# Patient Record
Sex: Female | Born: 1985 | Race: Black or African American | Hispanic: No | Marital: Married | Smoking: Never smoker
Health system: Southern US, Community
[De-identification: ages and names within clinical notes are randomized; demographics above are authoritative.]

## PROBLEM LIST (undated history)

## (undated) DIAGNOSIS — Z803 Family history of malignant neoplasm of breast: Secondary | ICD-10-CM

## (undated) DIAGNOSIS — G4730 Sleep apnea, unspecified: Secondary | ICD-10-CM

## (undated) DIAGNOSIS — I10 Essential (primary) hypertension: Secondary | ICD-10-CM

## (undated) DIAGNOSIS — T7840XA Allergy, unspecified, initial encounter: Secondary | ICD-10-CM

## (undated) DIAGNOSIS — A419 Sepsis, unspecified organism: Secondary | ICD-10-CM

## (undated) DIAGNOSIS — Z800 Family history of malignant neoplasm of digestive organs: Secondary | ICD-10-CM

## (undated) DIAGNOSIS — Z5189 Encounter for other specified aftercare: Secondary | ICD-10-CM

## (undated) DIAGNOSIS — K5792 Diverticulitis of intestine, part unspecified, without perforation or abscess without bleeding: Secondary | ICD-10-CM

## (undated) DIAGNOSIS — D649 Anemia, unspecified: Secondary | ICD-10-CM

## (undated) DIAGNOSIS — E669 Obesity, unspecified: Secondary | ICD-10-CM

## (undated) HISTORY — DX: Family history of malignant neoplasm of breast: Z80.3

## (undated) HISTORY — DX: Diverticulitis of intestine, part unspecified, without perforation or abscess without bleeding: K57.92

## (undated) HISTORY — DX: Anemia, unspecified: D64.9

## (undated) HISTORY — DX: Essential (primary) hypertension: I10

## (undated) HISTORY — PX: NO PAST SURGERIES: SHX2092

## (undated) HISTORY — DX: Sleep apnea, unspecified: G47.30

## (undated) HISTORY — DX: Sepsis, unspecified organism: A41.9

## (undated) HISTORY — DX: Obesity, unspecified: E66.9

## (undated) HISTORY — DX: Encounter for other specified aftercare: Z51.89

## (undated) HISTORY — DX: Allergy, unspecified, initial encounter: T78.40XA

## (undated) HISTORY — DX: Family history of malignant neoplasm of digestive organs: Z80.0

---
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
## 2003-03-18 ENCOUNTER — Encounter: Admission: RE | Admit: 2003-03-18 | Discharge: 2003-03-18 | Payer: Self-pay | Admitting: Obstetrics and Gynecology

## 2003-03-22 ENCOUNTER — Encounter: Admission: RE | Admit: 2003-03-22 | Discharge: 2003-03-22 | Payer: Self-pay | Admitting: Obstetrics and Gynecology

## 2003-03-24 ENCOUNTER — Inpatient Hospital Stay (HOSPITAL_COMMUNITY): Admission: AD | Admit: 2003-03-24 | Discharge: 2003-03-26 | Payer: Self-pay | Admitting: Obstetrics and Gynecology

## 2003-08-15 ENCOUNTER — Emergency Department (HOSPITAL_COMMUNITY): Admission: EM | Admit: 2003-08-15 | Discharge: 2003-08-15 | Payer: Self-pay | Admitting: Emergency Medicine

## 2005-01-20 ENCOUNTER — Emergency Department (HOSPITAL_COMMUNITY): Admission: EM | Admit: 2005-01-20 | Discharge: 2005-01-20 | Payer: Self-pay | Admitting: Emergency Medicine

## 2005-05-27 ENCOUNTER — Emergency Department (HOSPITAL_COMMUNITY): Admission: EM | Admit: 2005-05-27 | Discharge: 2005-05-27 | Payer: Self-pay | Admitting: Emergency Medicine

## 2006-04-12 ENCOUNTER — Emergency Department (HOSPITAL_COMMUNITY): Admission: EM | Admit: 2006-04-12 | Discharge: 2006-04-12 | Payer: Self-pay | Admitting: Emergency Medicine

## 2007-08-26 ENCOUNTER — Emergency Department (HOSPITAL_COMMUNITY): Admission: EM | Admit: 2007-08-26 | Discharge: 2007-08-26 | Payer: Self-pay | Admitting: Emergency Medicine

## 2008-05-22 ENCOUNTER — Inpatient Hospital Stay (HOSPITAL_COMMUNITY): Admission: AD | Admit: 2008-05-22 | Discharge: 2008-05-24 | Payer: Self-pay | Admitting: Obstetrics and Gynecology

## 2009-02-28 ENCOUNTER — Emergency Department (HOSPITAL_COMMUNITY): Admission: EM | Admit: 2009-02-28 | Discharge: 2009-02-28 | Payer: Self-pay | Admitting: Emergency Medicine

## 2009-04-17 ENCOUNTER — Emergency Department (HOSPITAL_COMMUNITY): Admission: EM | Admit: 2009-04-17 | Discharge: 2009-04-17 | Payer: Self-pay | Admitting: Family Medicine

## 2009-06-19 ENCOUNTER — Emergency Department (HOSPITAL_COMMUNITY): Admission: EM | Admit: 2009-06-19 | Discharge: 2009-06-19 | Payer: Self-pay | Admitting: Emergency Medicine

## 2009-09-01 ENCOUNTER — Emergency Department (HOSPITAL_COMMUNITY): Admission: EM | Admit: 2009-09-01 | Discharge: 2009-09-01 | Payer: Self-pay | Admitting: Emergency Medicine

## 2010-02-04 ENCOUNTER — Emergency Department (HOSPITAL_COMMUNITY): Admission: EM | Admit: 2010-02-04 | Discharge: 2010-02-04 | Payer: Self-pay | Admitting: Emergency Medicine

## 2010-06-01 ENCOUNTER — Emergency Department (HOSPITAL_COMMUNITY)
Admission: EM | Admit: 2010-06-01 | Discharge: 2010-06-01 | Payer: Self-pay | Source: Home / Self Care | Admitting: Emergency Medicine

## 2010-10-23 NOTE — Discharge Summary (Signed)
NAMELESLEY, ATKIN                ACCOUNT NO.:  [id number]   MEDICAL RECORD NO.:  [id number]          PATIENT TYPE:  INP   LOCATION:  9142                          FACILITY:  [facility]   PHYSICIAN:  Zenaida Niece, M.D.DATE OF BIRTH:  13-Feb-1986   DATE OF ADMISSION:  05/22/2008  DATE OF DISCHARGE:  05/24/2008                               DISCHARGE SUMMARY   ADMISSION DIAGNOSIS:  Intrauterine pregnancy at 40 weeks.   DISCHARGE DIAGNOSIS:  Intrauterine pregnancy at 40 weeks.   PROCEDURES:  On May 23, 2008, she had a spontaneous vaginal  delivery.   HISTORY AND PHYSICAL:  This is a 25 year old, gravida 3, para 1-0-1-1,  with an EGA of 40+ weeks, who presents with a complaint of leaking fluid  since 19:45 on May 22, 2008, with occasional contractions.  Evaluation in triage confirmed rupture of membranes and cervix was 2, 50  and -2.  Prenatal care essentially uncomplicated.  She had an ultrasound  at 35 weeks for size greater than dates that revealed appropriate for  gestational age and normal amniotic fluid volume.   PRENATAL LABORATORY DATA:  Blood type is A+ with negative antibody  screen.  RPR nonreactive.  Hepatitis B surface antigen negative.  Rubella immune.  Cystic fibrosis negative.  One-hour Glucola 124.  Group  B strep is negative.   PAST OBSTETRIC HISTORY:  One vaginal delivery at 40 weeks, 8 pounds 3  ounces with a third-degree laceration, and one spontaneous abortion.   PAST MEDICAL HISTORY:  Migraines.   ALLERGIES:  PENICILLIN which causes a rash and itch.   PHYSICAL EXAMINATION:  She is afebrile with stable vital signs.  Last  weight in the office is 295 pounds.  Fetal heart tracing is reactive  with contractions every 5-6 minutes.  Abdomen is obese, gravid,  nontender with an estimated fetal weight of 89 pounds.  Cervix is 2, 50,  -2 vertex presentation per the nurse and again membranes were ruptured.   HOSPITAL COURSE:  The patient was admitted and as  contractions were not  very close she was started on Pitocin augmentation.  Throughout the  night, she progressed in labor and received an epidural.  She reached  complete, pushed fair, and on the morning of May 23, 2008, had a  vaginal delivery of a viable female infant with Apgars of 9 and 9 that  weighed 7 pounds 12 ounces.  Placenta delivered spontaneous was intact  and was sent for cord blood donation.  She had small skid marks which  were hemostatic and not repaired.  Estimated blood loss was  approximately 500 mL.  Postpartum, she had no significant complications.  Predelivery hemoglobin 10.6, postdelivery 10.2.  On postpartum day #1,  she requested discharge home and was felt to be stable enough for  discharge.   DISCHARGE INSTRUCTIONS:  Regular diet, pelvic rest, followup in 6 weeks.  Medications are over-the-counter ibuprofen as needed, and she was given  our discharge pamphlet.      Zenaida Niece, M.D.  Electronically Signed     TDM/MEDQ  D:  06/26/2008  T:  06/26/2008  Job:  [id number]

## 2010-10-23 NOTE — Discharge Summary (Signed)
NAME:  Mikayla Rios, Mikayla Rios                          ACCOUNT NO.:  [id number]   MEDICAL RECORD NO.:  [id number]                   PATIENT TYPE:  INP   LOCATION:  9103                                 FACILITY:  [facility]   PHYSICIAN:  Zenaida Niece, M.D.             DATE OF BIRTH:  06/14/1985   DATE OF ADMISSION:  03/24/2003  DATE OF DISCHARGE:  03/26/2003                                 DISCHARGE SUMMARY   ADMISSION DIAGNOSES:  1. Intrauterine pregnancy at 40 weeks.  2. Premature rupture of membranes.  3. Group B Strep carrier.   DISCHARGE DIAGNOSES:  1. Intrauterine pregnancy at 40 weeks.  2. Premature rupture of membranes.  3. Group B Strep carrier.  4. Vaginal laceration.  5. Postpartum hemorrhage.   PROCEDURE:  On October 17 she had a spontaneous vaginal delivery and went to  the operating room for repair of a vaginal laceration.   HISTORY AND PHYSICAL:  This is a 25 year old black female gravida 1, para 0  with an EGA of 40+ weeks by an LMP consistent with an 18-week ultrasound  with a due date of October 11 who presents with a complaint of spontaneous  rupture of membranes at 0330 on October 17 with some contractions, no  bleeding, and good fetal movement.  Evaluation in triage confirmed rupture  of membranes with light meconium and she was 1 cm dilated with a low grade  temperature to 100.5.   PRENATAL LABORATORIES:  Blood type is A+ with a negative antibody screen.  Rubella immune.  Sickle screen is negative.  RPR nonreactive.  Hepatitis B  surface antigen negative.  HIV negative.  Gonorrhea and Chlamydia negative.  Triple screen normal.  One hour Glucola 67.  Group B Strep is positive.   Prenatal care essentially uncomplicated except for obesity.   PAST MEDICAL HISTORY:  Essentially noncontributory.   ALLERGIES:  PENICILLIN.   PHYSICAL EXAMINATION:  VITAL SIGNS:  She is afebrile with stable vital  signs.  Weight is approximately 265 pounds.  Fetal heart tracing  reactive.  ABDOMEN:  Gravid, obese.  Estimated fetal weight of 8 pounds.  PELVIC:  Vaginal examination was 1, 80, -1, vertex presentation.   HOSPITAL COURSE:  The patient was admitted and started on Pitocin for  induction and clindamycin for group B Strep prophylaxis.  She was allergic  to penicillin and this group B Strep was sensitive to clindamycin.  She  eventually entered active labor, received an epidural, progressed to  complete and pushed well.  On the evening of October 17 she had a vaginal  delivery of a viable female infant with Apgars of 8 and 9 that weighed 8  pounds 3 ounces.  There was a nuchal cord x1 which was reduced.  Placenta  delivered spontaneous and was intact.  She had a partial third degree  laceration which I was unable to adequately visualize in the delivery room.  She was taken to the OR for repair.  Estimated blood loss in the delivery  room was approximately 500 mL.  She was taken to the operating room and had  this partial third degree laceration repaired under her epidural anesthesia  with 3-0 Vicryl.  The internal anal sphincter was reapproximated with 2-0  Vicryl.  Estimated blood loss there was 400-500 mL for a total blood loss of  1000 mL.  Postpartum she did well with some episodes of lightheadedness.  Pre delivery hemoglobin 12.5, post delivery on postpartum day #1 was 8.5 and  post delivery on postpartum day #2 was 8.3.  She had no significant bleeding  on the postpartum floor.  On the morning of postpartum day #2 she was stable  for discharge home.   DISCHARGE INSTRUCTIONS:  Regular diet.  Pelvic rest.  Follow up in four to  six weeks.   DISCHARGE MEDICATIONS:  1. Ibuprofen 600 mg p.o. q.6h. p.r.n.  2. Percocet #20 one to two p.o. q.4-6h. p.r.n. pain.  3. Over-the-counter iron b.i.d.  4. She is given our discharge pamphlet.                                               Zenaida Niece, M.D.    TDM/MEDQ  D:  03/26/2003  T:  03/26/2003  Job:   [id number]

## 2010-10-23 NOTE — Op Note (Signed)
NAME:  Mikayla Rios, Mikayla Rios                          ACCOUNT NO.:  [id number]   MEDICAL RECORD NO.:  [id number]                   PATIENT TYPE:  INP   LOCATION:  9103                                 [facility]ACILITY:  [facility]   PHYSICIAN:  Zenaida Niece, M.D.             DATE OF BIRTH:  22-Aug-1985   DATE OF PROCEDURE:  03/24/2003  DATE OF DISCHARGE:                                 OPERATIVE REPORT   PREOPERATIVE DIAGNOSIS:  Postpartum vaginal laceration.   POSTOPERATIVE DIAGNOSIS:  Postpartum vaginal laceration.   PROCEDURE:  Repair of third degree vaginal laceration.   SURGEON:  Zenaida Niece, M.D.   ANESTHESIA:  Epidural.   ESTIMATED BLOOD LOSS:  Approximately 600 mL at delivery and 400 mL in the  operating room for a total of 1000 mL.   FINDINGS:  The patient had a spontaneous vaginal delivery without  significant complications.  In the labor and delivery room she had a partial  third degree laceration with a very deep vaginal portion.  Due to her body  habitus and some fairly steady bleeding, I was unable to get adequate  visualization of the apex of this laceration.  Prior to bringing her back to  the operating room, the rectus muscle was reinforced at the midline with a  figure-of-eight suture of 2-0 Vicryl and this did appear to reapproximate  the small portion of the external anal sphincter that was torn.  She was  then taken to the operating room for the remainder of the repair, which  confirmed the above findings.   PROCEDURE IN DETAIL:  The patient was taken to the operating room and placed  in the dorsal supine position.  Her previously-placed epidural was dosed and  her legs were placed in mobile stirrups. The perineum and vagina were  prepped and draped in the sterile fashion and her bladder drained with a red  rubber catheter.  With suction and retraction I was able to visualize the  apex of the vaginal portion of the laceration.  This was repaired in  standard fashion  to the hymenal ring.  Multiple bleeding sites were  controlled with figure-of-eight sutures of 3-0 Vicryl.  The perineal muscles  were then reapproximated again in the standard fashion per a second-degree  episiotomy.  This was done down through the external anal sphincter, which  had previously been reapproximated.  The skin was then reapproximated in  subcuticular fashion and the external mucosa reapproximated with running  suture.  Again several other bleeding sites were controlled with 3-0 Vicryl.  Eventual adequate hemostasis was achieved.  The patient tolerated the  procedure well and was taken to the recovery room in stable condition.  Counts were correct, and there were no sponges left in the vagina.  A  [place] sponge was used to push the cervix up during the repair, but this  was removed.  Again, total blood loss was  approximately 1000 mL, and the  patient was hemodynamically stable.                                               Zenaida Niece, M.D.    TDM/MEDQ  D:  03/24/2003  T:  03/25/2003  Job:  [id number]

## 2011-03-12 LAB — RPR: RPR Ser Ql: NONREACTIVE

## 2011-03-12 LAB — CBC
HCT: 29.9 % — ABNORMAL LOW (ref 36.0–46.0)
HCT: 31.4 % — ABNORMAL LOW (ref 36.0–46.0)
Hemoglobin: 10.2 g/dL — ABNORMAL LOW (ref 12.0–15.0)
Hemoglobin: 10.6 g/dL — ABNORMAL LOW (ref 12.0–15.0)
MCHC: 33.6 g/dL (ref 30.0–36.0)
MCHC: 34 g/dL (ref 30.0–36.0)
MCV: 83.3 fL (ref 78.0–100.0)
MCV: 83.8 fL (ref 78.0–100.0)
Platelets: 352 10*3/uL (ref 150–400)
Platelets: 392 10*3/uL (ref 150–400)
RBC: 3.57 MIL/uL — ABNORMAL LOW (ref 3.87–5.11)
RBC: 3.78 MIL/uL — ABNORMAL LOW (ref 3.87–5.11)
RDW: 15.6 % — ABNORMAL HIGH (ref 11.5–15.5)
RDW: 16.2 % — ABNORMAL HIGH (ref 11.5–15.5)
WBC: 8.9 10*3/uL (ref 4.0–10.5)

## 2012-06-07 NOTE — L&D Delivery Note (Signed)
Delivery Note Pt progressed to complete and pushed well.  At 5:11 AM a viable female was delivered via Vaginal, Spontaneous Delivery (Presentation: vtx; LOA).  APGAR: 8, 9; weight pending.   Placenta status: Intact, Spontaneous.  Cord:  with the following complications: None.  Anesthesia: Epidural  Episiotomy: None Lacerations: None Suture Repair: None Est. Blood Loss (mL): 500  Mom to postpartum.  Baby to stay with mom.  Jaidy Cottam D 12/16/2012, 5:26 AM

## 2012-07-18 ENCOUNTER — Other Ambulatory Visit: Payer: Self-pay

## 2012-07-18 LAB — OB RESULTS CONSOLE GC/CHLAMYDIA: Gonorrhea: NEGATIVE

## 2012-10-13 ENCOUNTER — Other Ambulatory Visit (HOSPITAL_COMMUNITY): Payer: Self-pay | Admitting: Obstetrics and Gynecology

## 2012-10-13 DIAGNOSIS — Z3689 Encounter for other specified antenatal screening: Secondary | ICD-10-CM

## 2012-10-17 ENCOUNTER — Ambulatory Visit (HOSPITAL_COMMUNITY): Payer: Self-pay

## 2012-10-25 ENCOUNTER — Other Ambulatory Visit (HOSPITAL_COMMUNITY): Payer: Self-pay | Admitting: Obstetrics and Gynecology

## 2012-10-25 ENCOUNTER — Ambulatory Visit (HOSPITAL_COMMUNITY)
Admission: RE | Admit: 2012-10-25 | Discharge: 2012-10-25 | Disposition: A | Discharge status: Home / Self Care | Payer: No Typology Code available for payment source | Source: Ambulatory Visit | Attending: Obstetrics and Gynecology | Admitting: Obstetrics and Gynecology

## 2012-10-25 ENCOUNTER — Encounter (HOSPITAL_COMMUNITY): Payer: Self-pay

## 2012-10-25 DIAGNOSIS — Z3689 Encounter for other specified antenatal screening: Secondary | ICD-10-CM

## 2012-10-25 DIAGNOSIS — O139 Gestational [pregnancy-induced] hypertension without significant proteinuria, unspecified trimester: Secondary | ICD-10-CM | POA: Insufficient documentation

## 2012-11-02 ENCOUNTER — Other Ambulatory Visit (HOSPITAL_COMMUNITY): Payer: Self-pay | Admitting: Obstetrics and Gynecology

## 2012-11-02 DIAGNOSIS — E669 Obesity, unspecified: Secondary | ICD-10-CM

## 2012-11-02 DIAGNOSIS — Z3689 Encounter for other specified antenatal screening: Secondary | ICD-10-CM

## 2012-11-22 ENCOUNTER — Other Ambulatory Visit (HOSPITAL_COMMUNITY): Payer: Self-pay | Admitting: Obstetrics and Gynecology

## 2012-11-22 ENCOUNTER — Encounter (HOSPITAL_COMMUNITY): Payer: Self-pay

## 2012-11-22 ENCOUNTER — Ambulatory Visit (HOSPITAL_COMMUNITY)
Admission: RE | Admit: 2012-11-22 | Discharge: 2012-11-22 | Disposition: A | Discharge status: Home / Self Care | Payer: No Typology Code available for payment source | Source: Ambulatory Visit | Attending: Obstetrics and Gynecology | Admitting: Obstetrics and Gynecology

## 2012-11-22 DIAGNOSIS — E669 Obesity, unspecified: Secondary | ICD-10-CM

## 2012-11-22 DIAGNOSIS — O10019 Pre-existing essential hypertension complicating pregnancy, unspecified trimester: Secondary | ICD-10-CM | POA: Insufficient documentation

## 2012-11-22 DIAGNOSIS — Z3689 Encounter for other specified antenatal screening: Secondary | ICD-10-CM

## 2012-12-07 ENCOUNTER — Encounter (HOSPITAL_COMMUNITY): Payer: Self-pay | Admitting: *Deleted

## 2012-12-07 ENCOUNTER — Telehealth (HOSPITAL_COMMUNITY): Payer: Self-pay | Admitting: *Deleted

## 2012-12-07 LAB — OB RESULTS CONSOLE GBS: GBS: NEGATIVE

## 2012-12-07 NOTE — Telephone Encounter (Signed)
Preadmission screen  

## 2012-12-13 ENCOUNTER — Telehealth (HOSPITAL_COMMUNITY): Payer: Self-pay | Admitting: *Deleted

## 2012-12-13 ENCOUNTER — Encounter (HOSPITAL_COMMUNITY): Payer: Self-pay | Admitting: *Deleted

## 2012-12-13 NOTE — Telephone Encounter (Signed)
Preadmission screen  

## 2012-12-15 ENCOUNTER — Encounter (HOSPITAL_COMMUNITY): Payer: Self-pay | Admitting: Anesthesiology

## 2012-12-15 ENCOUNTER — Encounter (HOSPITAL_COMMUNITY): Payer: Self-pay

## 2012-12-15 ENCOUNTER — Inpatient Hospital Stay (HOSPITAL_COMMUNITY)
Admission: RE | Admit: 2012-12-15 | Discharge: 2012-12-18 | DRG: 774 | Disposition: A | Discharge status: Home / Self Care | Payer: No Typology Code available for payment source | Source: Ambulatory Visit | Attending: Obstetrics and Gynecology | Admitting: Obstetrics and Gynecology

## 2012-12-15 ENCOUNTER — Inpatient Hospital Stay (HOSPITAL_COMMUNITY): Payer: No Typology Code available for payment source | Admitting: Anesthesiology

## 2012-12-15 DIAGNOSIS — O1002 Pre-existing essential hypertension complicating childbirth: Secondary | ICD-10-CM | POA: Diagnosis present

## 2012-12-15 LAB — ABO/RH: ABO/RH(D): A POS

## 2012-12-15 LAB — CBC
HCT: 31.9 % — ABNORMAL LOW (ref 36.0–46.0)
MCH: 24.7 pg — ABNORMAL LOW (ref 26.0–34.0)
MCHC: 32 g/dL (ref 30.0–36.0)
RDW: 16.5 % — ABNORMAL HIGH (ref 11.5–15.5)

## 2012-12-15 LAB — TYPE AND SCREEN
ABO/RH(D): A POS
Antibody Screen: NEGATIVE

## 2012-12-15 LAB — RPR: RPR Ser Ql: NONREACTIVE

## 2012-12-15 MED ORDER — LIDOCAINE HCL (PF) 1 % IJ SOLN
30.0000 mL | INTRAMUSCULAR | Status: DC | PRN
  Filled 2012-12-15 (×2): qty 30

## 2012-12-15 MED ORDER — CITRIC ACID-SODIUM CITRATE 334-500 MG/5ML PO SOLN: 30.0000 mL | ORAL | Status: DC | PRN

## 2012-12-15 MED ORDER — ONDANSETRON HCL 4 MG/2ML IJ SOLN: 4.0000 mg | Freq: Four times a day (QID) | INTRAMUSCULAR | Status: DC | PRN

## 2012-12-15 MED ORDER — OXYTOCIN 40 UNITS IN LACTATED RINGERS INFUSION - SIMPLE MED: 62.5000 mL/h | INTRAVENOUS | Status: DC

## 2012-12-15 MED ORDER — LACTATED RINGERS IV SOLN: 500.0000 mL | Freq: Once | INTRAVENOUS | Status: DC

## 2012-12-15 MED ORDER — OXYTOCIN BOLUS FROM INFUSION: 500.0000 mL | INTRAVENOUS | Status: DC

## 2012-12-15 MED ORDER — IBUPROFEN 600 MG PO TABS
600.0000 mg | ORAL_TABLET | Freq: Four times a day (QID) | ORAL | Status: DC | PRN
  Administered 2012-12-16: 600 mg via ORAL
  Filled 2012-12-15: qty 1

## 2012-12-15 MED ORDER — MISOPROSTOL 25 MCG QUARTER TABLET
25.0000 ug | ORAL_TABLET | ORAL | Status: DC | PRN
  Administered 2012-12-15: 25 ug via VAGINAL
  Filled 2012-12-15: qty 1
  Filled 2012-12-15: qty 0.25

## 2012-12-15 MED ORDER — EPHEDRINE 5 MG/ML INJ
10.0000 mg | INTRAVENOUS | Status: DC | PRN
  Filled 2012-12-15: qty 2

## 2012-12-15 MED ORDER — TERBUTALINE SULFATE 1 MG/ML IJ SOLN: 0.2500 mg | Freq: Once | INTRAMUSCULAR | Status: AC | PRN

## 2012-12-15 MED ORDER — LIDOCAINE HCL (PF) 1 % IJ SOLN
INTRAMUSCULAR | Status: DC | PRN
  Administered 2012-12-15 (×4): 4 mL

## 2012-12-15 MED ORDER — LACTATED RINGERS IV SOLN
500.0000 mL | INTRAVENOUS | Status: DC | PRN
  Administered 2012-12-16: 500 mL via INTRAVENOUS

## 2012-12-15 MED ORDER — DIPHENHYDRAMINE HCL 50 MG/ML IJ SOLN
12.5000 mg | INTRAMUSCULAR | Status: DC | PRN
  Administered 2012-12-15: 12.5 mg via INTRAVENOUS
  Filled 2012-12-15: qty 1

## 2012-12-15 MED ORDER — PHENYLEPHRINE 40 MCG/ML (10ML) SYRINGE FOR IV PUSH (FOR BLOOD PRESSURE SUPPORT)
80.0000 ug | PREFILLED_SYRINGE | INTRAVENOUS | Status: DC | PRN
  Filled 2012-12-15: qty 5
  Filled 2012-12-15: qty 2

## 2012-12-15 MED ORDER — FENTANYL 2.5 MCG/ML BUPIVACAINE 1/10 % EPIDURAL INFUSION (WH - ANES)
14.0000 mL/h | INTRAMUSCULAR | Status: DC | PRN
  Administered 2012-12-15 – 2012-12-16 (×2): 14 mL/h via EPIDURAL
  Filled 2012-12-15 (×2): qty 125

## 2012-12-15 MED ORDER — ACETAMINOPHEN 325 MG PO TABS: 650.0000 mg | ORAL_TABLET | ORAL | Status: DC | PRN

## 2012-12-15 MED ORDER — EPHEDRINE 5 MG/ML INJ
10.0000 mg | INTRAVENOUS | Status: DC | PRN
  Filled 2012-12-15: qty 2
  Filled 2012-12-15: qty 4

## 2012-12-15 MED ORDER — PHENYLEPHRINE 40 MCG/ML (10ML) SYRINGE FOR IV PUSH (FOR BLOOD PRESSURE SUPPORT)
80.0000 ug | PREFILLED_SYRINGE | INTRAVENOUS | Status: DC | PRN
  Filled 2012-12-15: qty 2

## 2012-12-15 MED ORDER — LACTATED RINGERS IV SOLN
INTRAVENOUS | Status: DC
  Administered 2012-12-15 (×2): via INTRAVENOUS

## 2012-12-15 MED ORDER — OXYCODONE-ACETAMINOPHEN 5-325 MG PO TABS: 1.0000 | ORAL_TABLET | ORAL | Status: DC | PRN

## 2012-12-15 MED ORDER — BUTORPHANOL TARTRATE 1 MG/ML IJ SOLN
1.0000 mg | INTRAMUSCULAR | Status: DC | PRN
  Administered 2012-12-15 – 2012-12-16 (×2): 1 mg via INTRAVENOUS
  Filled 2012-12-15 (×2): qty 1

## 2012-12-15 MED ORDER — OXYTOCIN 40 UNITS IN LACTATED RINGERS INFUSION - SIMPLE MED
1.0000 m[IU]/min | INTRAVENOUS | Status: DC
  Administered 2012-12-15: 2 m[IU]/min via INTRAVENOUS
  Filled 2012-12-15: qty 1000

## 2012-12-15 NOTE — Progress Notes (Signed)
Feeling ctx Afeb, VSS, BP ok FHT- Cat I, ctx q 3 min after one dose of cytotec VE- 3/50/-2, attempted AROM-unsuccessful due to pt discomfort Will monitor progress, pitocin if ctx space out

## 2012-12-15 NOTE — H&P (Signed)
Mikayla Rios is a 27 y.o. female, G4 P27, EGA 40+ weeks presenting for induction.  She had elevated BP early in pregnancy and intermittently, seemed to be better with appropriate sized cuff.  Has proteinuria documented by early 24 hr urine.  See prenatal records for complete history.  Maternal Medical History:  Fetal activity: Perceived fetal activity is normal.      OB History   Grav Para Term Preterm Abortions TAB SAB Ect Mult Living   3 2 2  0 0 0 0 0 0 2    SVD at term x 2  Past Medical History  Diagnosis Date  . Blood infection 1985  . Blood transfusion without reported diagnosis    Past Surgical History  Procedure Laterality Date  . No past surgeries     Family History: family history includes Autism in her son; Cancer in her mother, paternal grandfather, and paternal uncle; Diabetes in her maternal grandfather, maternal grandmother, mother, and paternal grandmother; Heart disease in her paternal aunt; Hypertension in her maternal grandmother and mother; and Kidney disease in her maternal grandfather. Social History:  reports that she has never smoked. She has never used smokeless tobacco. She reports that she does not drink alcohol or use illicit drugs.   Prenatal Transfer Tool  Maternal Diabetes: No Genetic Screening: Declined Maternal Ultrasounds/Referrals: Normal Fetal Ultrasounds or other Referrals:  None Maternal Substance Abuse:  No Significant Maternal Medications:  None Significant Maternal Lab Results:  Lab values include: Group B Strep negative Other Comments:  None  Review of Systems  Respiratory: Negative.   Cardiovascular: Negative.     Dilation: 1.5 Effacement (%): 50 Station: -2 Exam by:: L. Lima, RN Blood pressure 140/69, pulse 93, temperature 99 F (37.2 C), temperature source Oral, resp. rate 22, height 5\' 3"  (1.6 m), weight 154.677 kg (341 lb). Maternal Exam:  Abdomen: Estimated fetal weight is 8 lbs.   Fetal presentation:  vertex  Introitus: Normal vulva. Normal vagina.  Amniotic fluid character: not assessed.  Pelvis: adequate for delivery.   Cervix: Cervix evaluated by digital exam.     Fetal Exam Fetal Monitor Review: Mode: ultrasound.   Baseline rate: 140.  Variability: moderate (6-25 bpm).   Pattern: accelerations present and no decelerations.    Fetal State Assessment: Category I - tracings are normal.     Physical Exam  Constitutional:  Obese  Cardiovascular: Normal rate, regular rhythm and normal heart sounds.   No murmur heard. Respiratory: Effort normal and breath sounds normal. No respiratory distress. She has no wheezes.  GI: Soft.  Obese, gravid    Prenatal labs: ABO, Rh: A/Positive/-- (02/11 0000) Antibody: Negative (02/11 0000) Rubella: Immune (02/11 0000) RPR: Nonreactive (02/11 0000)  HBsAg: Negative (02/11 0000)  HIV: Non-reactive (02/11 0000)  GBS: Negative (07/03 0000)  GCT:  nl  Assessment/Plan: IUP at 40+ weeks with intermittent elevated BP for induction.  Cervix borderline favorable.  Will give one dose of cytotec and then reassess.  Monitor BP.   Lien Lyman D 12/15/2012, 1:17 PM

## 2012-12-15 NOTE — Anesthesia Preprocedure Evaluation (Signed)
Anesthesia Evaluation  Patient identified by MRN, date of birth, ID band Patient awake    Reviewed: Allergy & Precautions, H&P , NPO status , Patient's Chart, lab work & pertinent test results, reviewed documented beta blocker date and time   History of Anesthesia Complications Negative for: history of anesthetic complications  Airway Mallampati: III TM Distance: >3 FB Neck ROM: full    Dental  (+) Teeth Intact   Pulmonary neg pulmonary ROS,  breath sounds clear to auscultation        Cardiovascular negative cardio ROS  Rhythm:regular Rate:Normal     Neuro/Psych negative neurological ROS  negative psych ROS   GI/Hepatic negative GI ROS, Neg liver ROS,   Endo/Other  Morbid obesity  Renal/GU negative Renal ROS  negative genitourinary   Musculoskeletal   Abdominal   Peds  Hematology  (+) anemia ,   Anesthesia Other Findings   Reproductive/Obstetrics (+) Pregnancy                           Anesthesia Physical Anesthesia Plan  ASA: III  Anesthesia Plan: Epidural   Post-op Pain Management:    Induction:   Airway Management Planned:   Additional Equipment:   Intra-op Plan:   Post-operative Plan:   Informed Consent: I have reviewed the patients History and Physical, chart, labs and discussed the procedure including the risks, benefits and alternatives for the proposed anesthesia with the patient or authorized representative who has indicated his/her understanding and acceptance.     Plan Discussed with:   Anesthesia Plan Comments:         Anesthesia Quick Evaluation

## 2012-12-15 NOTE — Anesthesia Procedure Notes (Signed)
Epidural Patient location during procedure: OB Start time: 12/15/2012 7:00 PM  Staffing Performed by: anesthesiologist   Preanesthetic Checklist Completed: patient identified, site marked, surgical consent, pre-op evaluation, timeout performed, IV checked, risks and benefits discussed and monitors and equipment checked  Epidural Patient position: sitting Prep: site prepped and draped and DuraPrep Patient monitoring: continuous pulse ox and blood pressure Approach: midline Injection technique: LOR air  Needle:  Needle type: Tuohy  Needle gauge: 17 G Needle length: 9 cm and 9 Needle insertion depth: 7 cm Catheter type: closed end flexible Catheter size: 19 Gauge Catheter at skin depth: 12 cm Test dose: negative  Assessment Events: blood not aspirated, injection not painful, no injection resistance, negative IV test and no paresthesia  Additional Notes Discussed risk of headache, infection, bleeding, nerve injury and failed or incomplete block.  Patient voices understanding and wishes to proceed.  Epidural placed easily on first attempt.  No paresthesia.  Patient tolerated procedure well with no apparent complications.  Jasmine December, MDReason for block:procedure for pain

## 2012-12-15 NOTE — Progress Notes (Signed)
Pt c/o itching.  SEE MAR

## 2012-12-16 ENCOUNTER — Encounter (HOSPITAL_COMMUNITY): Payer: Self-pay

## 2012-12-16 MED ORDER — BENZOCAINE-MENTHOL 20-0.5 % EX AERO: 1.0000 "application " | INHALATION_SPRAY | CUTANEOUS | Status: DC | PRN

## 2012-12-16 MED ORDER — DIBUCAINE 1 % RE OINT: 1.0000 "application " | TOPICAL_OINTMENT | RECTAL | Status: DC | PRN

## 2012-12-16 MED ORDER — ONDANSETRON HCL 4 MG/2ML IJ SOLN: 4.0000 mg | INTRAMUSCULAR | Status: DC | PRN

## 2012-12-16 MED ORDER — IBUPROFEN 600 MG PO TABS
600.0000 mg | ORAL_TABLET | Freq: Four times a day (QID) | ORAL | Status: DC
  Administered 2012-12-16 – 2012-12-18 (×9): 600 mg via ORAL
  Filled 2012-12-16 (×9): qty 1

## 2012-12-16 MED ORDER — TETANUS-DIPHTH-ACELL PERTUSSIS 5-2.5-18.5 LF-MCG/0.5 IM SUSP: 0.5000 mL | Freq: Once | INTRAMUSCULAR | Status: DC

## 2012-12-16 MED ORDER — METHYLERGONOVINE MALEATE 0.2 MG PO TABS: 0.2000 mg | ORAL_TABLET | ORAL | Status: DC | PRN

## 2012-12-16 MED ORDER — WITCH HAZEL-GLYCERIN EX PADS: 1.0000 "application " | MEDICATED_PAD | CUTANEOUS | Status: DC | PRN

## 2012-12-16 MED ORDER — METHYLERGONOVINE MALEATE 0.2 MG/ML IJ SOLN: 0.2000 mg | INTRAMUSCULAR | Status: DC | PRN

## 2012-12-16 MED ORDER — BUPIVACAINE HCL (PF) 0.25 % IJ SOLN
INTRAMUSCULAR | Status: DC | PRN
  Administered 2012-12-16: 10 mL via EPIDURAL

## 2012-12-16 MED ORDER — DIPHENHYDRAMINE HCL 25 MG PO CAPS: 25.0000 mg | ORAL_CAPSULE | Freq: Four times a day (QID) | ORAL | Status: DC | PRN

## 2012-12-16 MED ORDER — LANOLIN HYDROUS EX OINT: TOPICAL_OINTMENT | CUTANEOUS | Status: DC | PRN

## 2012-12-16 MED ORDER — ZOLPIDEM TARTRATE 5 MG PO TABS: 5.0000 mg | ORAL_TABLET | Freq: Every evening | ORAL | Status: DC | PRN

## 2012-12-16 MED ORDER — ONDANSETRON HCL 4 MG PO TABS: 4.0000 mg | ORAL_TABLET | ORAL | Status: DC | PRN

## 2012-12-16 MED ORDER — MEASLES, MUMPS & RUBELLA VAC ~~LOC~~ INJ
0.5000 mL | INJECTION | Freq: Once | SUBCUTANEOUS | Status: DC
  Filled 2012-12-16: qty 0.5

## 2012-12-16 MED ORDER — MAGNESIUM HYDROXIDE 400 MG/5ML PO SUSP: 30.0000 mL | ORAL | Status: DC | PRN

## 2012-12-16 MED ORDER — SIMETHICONE 80 MG PO CHEW: 80.0000 mg | CHEWABLE_TABLET | ORAL | Status: DC | PRN

## 2012-12-16 MED ORDER — SENNOSIDES-DOCUSATE SODIUM 8.6-50 MG PO TABS
2.0000 | ORAL_TABLET | Freq: Every day | ORAL | Status: DC
  Administered 2012-12-16: 2 via ORAL

## 2012-12-16 MED ORDER — PRENATAL MULTIVITAMIN CH
1.0000 | ORAL_TABLET | Freq: Every day | ORAL | Status: DC
  Administered 2012-12-16 – 2012-12-18 (×3): 1 via ORAL
  Filled 2012-12-16 (×3): qty 1

## 2012-12-16 MED ORDER — OXYCODONE-ACETAMINOPHEN 5-325 MG PO TABS: 1.0000 | ORAL_TABLET | ORAL | Status: DC | PRN

## 2012-12-16 NOTE — Plan of Care (Signed)
Problem: Phase I Progression Outcomes Goal: Count of instrument items (Specify in a note) Outcome: Completed/Met Date Met:  12/16/12 10  Comments:  10

## 2012-12-16 NOTE — Progress Notes (Signed)
Comfortable with epidural.  Had SROM with mod meconium at 1845, on pitocin Afeb, VSS, BP labile FHT- Cat I, early decels, ctx q 2 min VE- 5/70/-1, vtx Continue pitocin, monitor progress, anticipate SVD.

## 2012-12-17 NOTE — Anesthesia Postprocedure Evaluation (Signed)
Anesthesia Post Note  Patient: Mikayla Rios  Procedure(s) Performed: * No procedures listed *  Anesthesia type: Epidural  Patient location: Mother/Baby  Post pain: Pain level controlled  Post assessment: Post-op Vital signs reviewed  Last Vitals:  Filed Vitals:   12/17/12 0500  BP: 157/102  Pulse: 98  Temp: 36.7 C  Resp: 19    Post vital signs: Reviewed  Level of consciousness: awake  Complications: No apparent anesthesia complications

## 2012-12-17 NOTE — Progress Notes (Signed)
PPD #1 No problems Afeb, VSS, BP 130-150/60-100 Fundus firm, NT at U-1 Continue routine postpartum care, will keep today to keep an eye on BP.

## 2012-12-18 NOTE — Progress Notes (Signed)
PPD #2 Doing ok, no problems Afeb, VSS, BP still labile Fundus firm D/c home

## 2012-12-18 NOTE — Discharge Summary (Signed)
Obstetric Discharge Summary Reason for Admission: induction of labor Prenatal Procedures: none Intrapartum Procedures: spontaneous vaginal delivery Postpartum Procedures: none Complications-Operative and Postpartum: postpartum hypertension Hemoglobin  Date Value Range Status  12/15/2012 10.2* 12.0 - 15.0 g/dL Final     HCT  Date Value Range Status  12/15/2012 31.9* 36.0 - 46.0 % Final    Physical Exam:  General: alert Lochia: appropriate Uterine Fundus: firm  Discharge Diagnoses: Term Pregnancy-delivered and HTN (Chronic vs gestational)  Discharge Information: Date: 12/18/2012 Activity: pelvic rest Diet: routine Medications: Ibuprofen Condition: stable Instructions: refer to practice specific booklet Discharge to: home Follow-up Information   Follow up with Raven Harmes D, MD. Schedule an appointment as soon as possible for a visit in 1 week. (BP check)    Contact information:   [address] [place] [address] [phone]       Newborn Data: Live born female  Birth Weight: 7 lb 8.5 oz (3415 g) APGAR: 8, 9  Home with mother.  Acelyn Basham D 12/18/2012, 8:54 AM

## 2013-05-26 ENCOUNTER — Emergency Department (HOSPITAL_COMMUNITY)
Admission: EM | Admit: 2013-05-26 | Discharge: 2013-05-26 | Disposition: A | Discharge status: Home / Self Care | Payer: No Typology Code available for payment source | Attending: Emergency Medicine | Admitting: Emergency Medicine

## 2013-05-26 ENCOUNTER — Encounter (HOSPITAL_COMMUNITY): Payer: Self-pay | Admitting: Emergency Medicine

## 2013-05-26 DIAGNOSIS — K006 Disturbances in tooth eruption: Secondary | ICD-10-CM | POA: Insufficient documentation

## 2013-05-26 DIAGNOSIS — Z880 Allergy status to penicillin: Secondary | ICD-10-CM | POA: Insufficient documentation

## 2013-05-26 DIAGNOSIS — K089 Disorder of teeth and supporting structures, unspecified: Secondary | ICD-10-CM | POA: Insufficient documentation

## 2013-05-26 DIAGNOSIS — K0889 Other specified disorders of teeth and supporting structures: Secondary | ICD-10-CM

## 2013-05-26 DIAGNOSIS — Z8619 Personal history of other infectious and parasitic diseases: Secondary | ICD-10-CM | POA: Insufficient documentation

## 2013-05-26 MED ORDER — CLINDAMYCIN HCL 150 MG PO CAPS: 300.0000 mg | ORAL_CAPSULE | Freq: Three times a day (TID) | ORAL | Status: DC

## 2013-05-26 MED ORDER — HYDROCODONE-ACETAMINOPHEN 5-325 MG PO TABS: 1.0000 | ORAL_TABLET | Freq: Four times a day (QID) | ORAL | Status: DC | PRN

## 2013-05-26 NOTE — ED Notes (Signed)
Pt. reports right lower gum pain onset yesterday .

## 2013-05-26 NOTE — ED Provider Notes (Signed)
Medical screening examination/treatment/procedure(s) were performed by non-physician practitioner and as supervising physician I was immediately available for consultation/collaboration.    Olivia Mackie, MD 05/26/13 [phone]

## 2013-05-26 NOTE — ED Provider Notes (Signed)
CSN: [id number]     Arrival date & time 05/26/13  0004 History   First MD Initiated Contact with Patient 05/26/13 0053     Chief Complaint  Patient presents with  . Dental Pain   (Consider location/radiation/quality/duration/timing/severity/associated sxs/prior Treatment) Patient is a 27 y.o. female presenting with tooth pain. The history is provided by the patient. No language interpreter was used.  Dental Pain Location:  Lower Lower teeth location:  31/RL 2nd molar and 30/RL 1st molar Quality:  Aching, throbbing and sharp Severity:  Moderate Onset quality:  Gradual Duration:  4 days Timing:  Constant Progression:  Worsening (since yesterday) Chronicity:  New Context: poor dentition   Context: not trauma   Previous work-up:  Root canal Relieved by:  Nothing Worsened by:  Touching and pressure Ineffective treatments:  NSAIDs Associated symptoms: no difficulty swallowing, no drooling, no facial pain, no facial swelling, no fever, no gum swelling, no neck pain, no oral bleeding and no trismus     Past Medical History  Diagnosis Date  . Blood infection 1985  . Blood transfusion without reported diagnosis    Past Surgical History  Procedure Laterality Date  . No past surgeries     Family History  Problem Relation Age of Onset  . Cancer Mother     breast  . Diabetes Mother   . Hypertension Mother   . Autism Son   . Heart disease Paternal Aunt   . Cancer Paternal Uncle     colon  . Diabetes Maternal Grandmother   . Hypertension Maternal Grandmother   . Diabetes Maternal Grandfather   . Kidney disease Maternal Grandfather   . Diabetes Paternal Grandmother   . Cancer Paternal Grandfather     colon   History  Substance Use Topics  . Smoking status: Never Smoker   . Smokeless tobacco: Never Used  . Alcohol Use: No   OB History   Grav Para Term Preterm Abortions TAB SAB Ect Mult Living   3 3 3  0 0 0 0 0 0 3     Review of Systems  Constitutional: Negative for  fever.  HENT: Positive for dental problem. Negative for drooling and facial swelling.   Musculoskeletal: Negative for neck pain.  All other systems reviewed and are negative.    Allergies  Shellfish allergy and Penicillins  Home Medications   Current Outpatient Rx  Name  Route  Sig  Dispense  Refill  . ibuprofen (ADVIL,MOTRIN) 200 MG tablet   Oral   Take 800 mg by mouth every 6 (six) hours as needed for mild pain.         . clindamycin (CLEOCIN) 150 MG capsule   Oral   Take 2 capsules (300 mg total) by mouth 3 (three) times daily. May dispense as 150mg  capsules   42 capsule   0   . HYDROcodone-acetaminophen (NORCO/VICODIN) 5-325 MG per tablet   Oral   Take 1 tablet by mouth every 6 (six) hours as needed.   5 tablet   0    BP 165/108  Pulse 98  Temp(Src) 98.7 F (37.1 C) (Oral)  Resp 18  SpO2 100%  LMP 05/18/2013  Physical Exam  Nursing note and vitals reviewed. Constitutional: She is oriented to person, place, and time. She appears well-developed and well-nourished. No distress.  HENT:  Head: Normocephalic and atraumatic.  Mouth/Throat: Uvula is midline, oropharynx is clear and moist and mucous membranes are normal. No oral lesions. No trismus in the jaw. Abnormal dentition.  No lacerations. No oropharyngeal exudate.    Uvula midline and patient tolerating secretions without difficulty. No oral lesions or bleeding. No area of fluctuance.  Eyes: Conjunctivae and EOM are normal. Pupils are equal, round, and reactive to light. No scleral icterus.  Neck: Normal range of motion. Neck supple.  No nuchal rigidity or meningismus  Pulmonary/Chest: Effort normal. No stridor. No respiratory distress.  Musculoskeletal: Normal range of motion.  Lymphadenopathy:    She has no cervical adenopathy.  Neurological: She is alert and oriented to person, place, and time.  Skin: Skin is warm and dry. No rash noted. She is not diaphoretic. No erythema. No pallor.  Psychiatric: She  has a normal mood and affect. Her behavior is normal.    ED Course  Procedures (including critical care time) Labs Review Labs Reviewed - No data to display Imaging Review No results found.  EKG Interpretation   None       MDM   1. Dentalgia    Uncomplicated dentalgia. Patient well and nontoxic appearing, hemodynamically stable, and afebrile. Patient tolerating secretions without difficulty. Uvula midline without evidence of peritonsillar abscess. No evidence on physical exam to suggest Ludwig's angina. Patient reliable for dental followup. She will be discharged today with course of clindamycin to cover for infection. Patient instructed to take ibuprofen for pain control and apply heat packs to her face. Norco for breakthrough pain. Return precautions discussed and patient agreeable to plan with no unaddressed concerns.    Antony Madura, PA-C 05/26/13 [phone]

## 2013-05-26 NOTE — ED Notes (Signed)
Pt reports right lower molar pain where molar is missing. States that she has been taking ibuprofen 800mg  for the pain. Last dose was around 3pm yesterday afternoon. Has an appt with the dentist for next week.

## 2014-04-08 ENCOUNTER — Encounter (HOSPITAL_COMMUNITY): Payer: Self-pay | Admitting: Emergency Medicine

## 2016-03-25 ENCOUNTER — Emergency Department (HOSPITAL_COMMUNITY)
Admission: EM | Admit: 2016-03-25 | Discharge: 2016-03-25 | Disposition: A | Discharge status: Home / Self Care | Payer: No Typology Code available for payment source | Attending: Emergency Medicine | Admitting: Emergency Medicine

## 2016-03-25 ENCOUNTER — Encounter (HOSPITAL_COMMUNITY): Payer: Self-pay | Admitting: Emergency Medicine

## 2016-03-25 DIAGNOSIS — K625 Hemorrhage of anus and rectum: Secondary | ICD-10-CM | POA: Insufficient documentation

## 2016-03-25 DIAGNOSIS — N3000 Acute cystitis without hematuria: Secondary | ICD-10-CM

## 2016-03-25 DIAGNOSIS — N3090 Cystitis, unspecified without hematuria: Secondary | ICD-10-CM | POA: Insufficient documentation

## 2016-03-25 LAB — CBC
HCT: 34.6 % — ABNORMAL LOW (ref 36.0–46.0)
Hemoglobin: 11.3 g/dL — ABNORMAL LOW (ref 12.0–15.0)
MCH: 24.8 pg — AB (ref 26.0–34.0)
MCHC: 32.7 g/dL (ref 30.0–36.0)
MCV: 76 fL — ABNORMAL LOW (ref 78.0–100.0)
PLATELETS: 358 10*3/uL (ref 150–400)
RBC: 4.55 MIL/uL (ref 3.87–5.11)
RDW: 15.5 % (ref 11.5–15.5)
WBC: 7.1 10*3/uL (ref 4.0–10.5)

## 2016-03-25 LAB — URINALYSIS, ROUTINE W REFLEX MICROSCOPIC
Bilirubin Urine: NEGATIVE
Glucose, UA: NEGATIVE mg/dL
KETONES UR: NEGATIVE mg/dL
Nitrite: POSITIVE — AB
PROTEIN: NEGATIVE mg/dL
Specific Gravity, Urine: 1.028 (ref 1.005–1.030)
pH: 6 (ref 5.0–8.0)

## 2016-03-25 LAB — COMPREHENSIVE METABOLIC PANEL
ALK PHOS: 35 U/L — AB (ref 38–126)
ALT: 19 U/L (ref 14–54)
AST: 30 U/L (ref 15–41)
Albumin: 4.1 g/dL (ref 3.5–5.0)
Anion gap: 9 (ref 5–15)
BUN: 11 mg/dL (ref 6–20)
CALCIUM: 9 mg/dL (ref 8.9–10.3)
CO2: 22 mmol/L (ref 22–32)
CREATININE: 0.7 mg/dL (ref 0.44–1.00)
Chloride: 107 mmol/L (ref 101–111)
Glucose, Bld: 92 mg/dL (ref 65–99)
Potassium: 4.8 mmol/L (ref 3.5–5.1)
Sodium: 138 mmol/L (ref 135–145)
Total Bilirubin: 1.2 mg/dL (ref 0.3–1.2)
Total Protein: 8 g/dL (ref 6.5–8.1)

## 2016-03-25 LAB — URINE MICROSCOPIC-ADD ON

## 2016-03-25 LAB — TYPE AND SCREEN
ABO/RH(D): A POS
Antibody Screen: NEGATIVE

## 2016-03-25 LAB — POC OCCULT BLOOD, ED: Fecal Occult Bld: NEGATIVE

## 2016-03-25 LAB — LIPASE, BLOOD: Lipase: 16 U/L (ref 11–51)

## 2016-03-25 LAB — POC URINE PREG, ED: PREG TEST UR: NEGATIVE

## 2016-03-25 LAB — ABO/RH: ABO/RH(D): A POS

## 2016-03-25 MED ORDER — SODIUM CHLORIDE 0.9 % IV BOLUS (SEPSIS)
1000.0000 mL | Freq: Once | INTRAVENOUS | Status: AC
  Administered 2016-03-25: 1000 mL via INTRAVENOUS

## 2016-03-25 MED ORDER — CIPROFLOXACIN IN D5W 400 MG/200ML IV SOLN
400.0000 mg | Freq: Once | INTRAVENOUS | Status: AC
  Administered 2016-03-25: 400 mg via INTRAVENOUS
  Filled 2016-03-25: qty 200

## 2016-03-25 MED ORDER — CIPROFLOXACIN HCL 500 MG PO TABS: 500.0000 mg | ORAL_TABLET | Freq: Two times a day (BID) | ORAL | 0 refills | Status: DC

## 2016-03-25 MED ORDER — SODIUM CHLORIDE 0.9 % IV SOLN
INTRAVENOUS | Status: DC
  Administered 2016-03-25: 18:00:00 via INTRAVENOUS

## 2016-03-25 NOTE — ED Notes (Signed)
MD at bedside. 

## 2016-03-25 NOTE — Discharge Instructions (Signed)
Take the antibiotic Cipro as directed for the urinary tract infection. Negative limited to follow-up with GI medicine. Also referral information to the wellness clinic provided to help you with primary care.  Return for any further bleeding 3 episodes of red blood.

## 2016-03-25 NOTE — ED Provider Notes (Signed)
[facility] DEPT Provider Note   CSN: [id number] Arrival date & time: 03/25/16  1423     History   Chief Complaint Chief Complaint  Patient presents with  . Rectal Bleeding  . Tachycardia    HPI Mikayla Rios is a 30 y.o. female.  Patient with one-week history of foul-smelling urine and some dysuria. To three-week history of occasional streaks of red blood in her bowel movements. No large amount of blood. No abdominal pain. No history of anything similar. No nausea no vomiting no fevers.      Past Medical History:  Diagnosis Date  . Blood infection ([place]) 1985  . Blood transfusion without reported diagnosis     Patient Active Problem List   Diagnosis Date Noted  . SVD (spontaneous vaginal delivery) 12/16/2012    Past Surgical History:  Procedure Laterality Date  . NO PAST SURGERIES      OB History    Gravida Para Term Preterm AB Living   3 3 3  0 0 3   SAB TAB Ectopic Multiple Live Births   0 0 0 0 3       Home Medications    Prior to Admission medications   Medication Sig Start Date End Date Taking? Authorizing Provider  clindamycin (CLEOCIN) 150 MG capsule Take 2 capsules (300 mg total) by mouth 3 (three) times daily. May dispense as 150mg  capsules Patient not taking: Reported on 03/25/2016 05/26/13   Antonietta Breach, PA-C  HYDROcodone-acetaminophen (NORCO/VICODIN) 5-325 MG per tablet Take 1 tablet by mouth every 6 (six) hours as needed. Patient not taking: Reported on 03/25/2016 05/26/13   Antonietta Breach, PA-C    Family History Family History  Problem Relation Age of Onset  . Cancer Mother     breast  . Diabetes Mother   . Hypertension Mother   . Autism Son   . Heart disease Paternal Aunt   . Cancer Paternal Uncle     colon  . Diabetes Maternal Grandmother   . Hypertension Maternal Grandmother   . Diabetes Maternal Grandfather   . Kidney disease Maternal Grandfather   . Diabetes Paternal Grandmother   . Cancer Paternal Grandfather     colon      Social History Social History  Substance Use Topics  . Smoking status: Never Smoker  . Smokeless tobacco: Never Used  . Alcohol use No     Allergies   Shellfish allergy and Penicillins   Review of Systems Review of Systems  Constitutional: Negative for fever.  HENT: Negative for congestion.   Eyes: Negative for visual disturbance.  Respiratory: Negative for shortness of breath.   Cardiovascular: Negative for chest pain.  Gastrointestinal: Positive for blood in stool. Negative for abdominal pain, nausea and vomiting.  Genitourinary: Positive for dysuria.  Musculoskeletal: Negative for back pain.  Skin: Negative for rash.  Neurological: Negative for headaches.  Hematological: Does not bruise/bleed easily.  Psychiatric/Behavioral: Negative for confusion.     Physical Exam Updated Vital Signs BP 162/89   Pulse (!) 129   Temp 99.3 F (37.4 C)   Resp 16   SpO2 100%   Physical Exam  Constitutional: She is oriented to person, place, and time. She appears well-developed and well-nourished. No distress.  HENT:  Head: Normocephalic and atraumatic.  Mouth/Throat: Oropharynx is clear and moist.  Eyes: EOM are normal. Pupils are equal, round, and reactive to light.  Neck: Normal range of motion. Neck supple.  Cardiovascular:  Tachycardic  Pulmonary/Chest: Effort normal and breath sounds  normal. No respiratory distress.  Abdominal: Soft. Bowel sounds are normal. There is no tenderness.  Genitourinary: Rectal exam shows guaiac negative stool.  Genitourinary Comments: Total exam without any external hemorrhoids no fissures no prolapsed internal hemorrhoids. Stool heme-negative.  Musculoskeletal: Normal range of motion. She exhibits no edema.  Neurological: She is alert and oriented to person, place, and time. No cranial nerve deficit. She exhibits normal muscle tone. Coordination normal.  Skin: Skin is warm. Capillary refill takes less than 2 seconds.  Nursing note and  vitals reviewed.    ED Treatments / Results  Labs (all labs ordered are listed, but only abnormal results are displayed) Labs Reviewed  COMPREHENSIVE METABOLIC PANEL - Abnormal; Notable for the following:       Result Value   Alkaline Phosphatase 35 (*)    All other components within normal limits  CBC - Abnormal; Notable for the following:    Hemoglobin 11.3 (*)    HCT 34.6 (*)    MCV 76.0 (*)    MCH 24.8 (*)    All other components within normal limits  URINALYSIS, ROUTINE W REFLEX MICROSCOPIC (NOT AT [facility]) - Abnormal; Notable for the following:    APPearance CLOUDY (*)    Hgb urine dipstick TRACE (*)    Nitrite POSITIVE (*)    Leukocytes, UA TRACE (*)    All other components within normal limits  URINE MICROSCOPIC-ADD ON - Abnormal; Notable for the following:    Squamous Epithelial / LPF 0-5 (*)    Bacteria, UA MANY (*)    All other components within normal limits  URINE CULTURE  LIPASE, BLOOD  POC URINE PREG, ED  POC OCCULT BLOOD, ED  POC OCCULT BLOOD, ED  TYPE AND SCREEN  ABO/RH   Results for orders placed or performed during the hospital encounter of 03/25/16  Lipase, blood  Result Value Ref Range   Lipase 16 11 - 51 U/L  Comprehensive metabolic panel  Result Value Ref Range   Sodium 138 135 - 145 mmol/L   Potassium 4.8 3.5 - 5.1 mmol/L   Chloride 107 101 - 111 mmol/L   CO2 22 22 - 32 mmol/L   Glucose, Bld 92 65 - 99 mg/dL   BUN 11 6 - 20 mg/dL   Creatinine, Ser 0.70 0.44 - 1.00 mg/dL   Calcium 9.0 8.9 - 10.3 mg/dL   Total Protein 8.0 6.5 - 8.1 g/dL   Albumin 4.1 3.5 - 5.0 g/dL   AST 30 15 - 41 U/L   ALT 19 14 - 54 U/L   Alkaline Phosphatase 35 (L) 38 - 126 U/L   Total Bilirubin 1.2 0.3 - 1.2 mg/dL   GFR calc non Af Amer >60 >60 mL/min   GFR calc Af Amer >60 >60 mL/min   Anion gap 9 5 - 15  CBC  Result Value Ref Range   WBC 7.1 4.0 - 10.5 K/uL   RBC 4.55 3.87 - 5.11 MIL/uL   Hemoglobin 11.3 (L) 12.0 - 15.0 g/dL   HCT 34.6 (L) 36.0 - 46.0 %   MCV  76.0 (L) 78.0 - 100.0 fL   MCH 24.8 (L) 26.0 - 34.0 pg   MCHC 32.7 30.0 - 36.0 g/dL   RDW 15.5 11.5 - 15.5 %   Platelets 358 150 - 400 K/uL  Urinalysis, Routine w reflex microscopic  Result Value Ref Range   Color, Urine YELLOW YELLOW   APPearance CLOUDY (A) CLEAR   Specific Gravity, Urine 1.028 1.005 -  1.030   pH 6.0 5.0 - 8.0   Glucose, UA NEGATIVE NEGATIVE mg/dL   Hgb urine dipstick TRACE (A) NEGATIVE   Bilirubin Urine NEGATIVE NEGATIVE   Ketones, ur NEGATIVE NEGATIVE mg/dL   Protein, ur NEGATIVE NEGATIVE mg/dL   Nitrite POSITIVE (A) NEGATIVE   Leukocytes, UA TRACE (A) NEGATIVE  Urine microscopic-add on  Result Value Ref Range   Squamous Epithelial / LPF 0-5 (A) NONE SEEN   WBC, UA 0-5 0 - 5 WBC/hpf   RBC / HPF 0-5 0 - 5 RBC/hpf   Bacteria, UA MANY (A) NONE SEEN  POC urine preg, ED  Result Value Ref Range   Preg Test, Ur NEGATIVE NEGATIVE  POC occult blood, ED  Result Value Ref Range   Fecal Occult Bld NEGATIVE NEGATIVE  Type and screen [place]  Result Value Ref Range   ABO/RH(D) A POS    Antibody Screen NEG    Sample Expiration 03/28/2016   ABO/Rh  Result Value Ref Range   ABO/RH(D) A POS     EKG  EKG Interpretation None       Radiology No results found.  Procedures Procedures (including critical care time)  Medications Ordered in ED Medications  0.9 %  sodium chloride infusion ( Intravenous New Bag/Given 03/25/16 1809)  sodium chloride 0.9 % bolus 1,000 mL (1,000 mLs Intravenous New Bag/Given 03/25/16 1704)  ciprofloxacin (CIPRO) IVPB 400 mg (0 mg Intravenous Stopped 03/25/16 1804)     Initial Impression / Assessment and Plan / ED Course  I have reviewed the triage vital signs and the nursing notes.  Pertinent labs & imaging results that were available during my care of the patient were reviewed by me and considered in my medical decision making (see chart for details).  Clinical Course    Patient presented with 2  concerns. One was foul-smelling urine and the other is been streaks of red blood in her bowel movements over the past 2 weeks. No bowel movements in a large amount of blood.  Workup here shows evidence of a urinary tract infection with positive nitrite. Urine culture sent. Patient received Cipro here she has a penicillin allergy we'll be sent home with Cipro. Patient's rectal exam was negative for any occult blood. Will refer to GI medicine for colonoscopy. Patient given precautions. Patient stable for discharge home.  Final Clinical Impressions(s) / ED Diagnoses   Final diagnoses:  Acute cystitis without hematuria  Rectal bleeding    New Prescriptions New Prescriptions   No medications on file     Fredia Sorrow, MD 03/25/16 1836

## 2016-03-25 NOTE — ED Triage Notes (Signed)
Pt report intermittent rectal bleeding (streaks in stool) for the past 3 weeks with BM. Has had foul smell to urine and lower back pain. HR 120s in triage.

## 2016-03-27 LAB — URINE CULTURE

## 2016-03-28 ENCOUNTER — Telehealth (HOSPITAL_BASED_OUTPATIENT_CLINIC_OR_DEPARTMENT_OTHER): Payer: Self-pay

## 2016-03-28 NOTE — Telephone Encounter (Signed)
Post ED Visit - Positive Culture Follow-up  Culture report reviewed by antimicrobial stewardship pharmacist:  []  Elenor Quinones, Pharm.D. []  Heide Guile, Pharm.D., BCPS []  Parks Neptune, Pharm.D. []  Alycia Rossetti, Pharm.D., BCPS [x]  [place], Pharm.D., BCPS, AAHIVP []  Legrand Como, Pharm.D., BCPS, AAHIVP []  Milus Glazier, Pharm.D. []  Stephens November, Pharm.D.  Positive urine culture Treated with Ciprofloxacin HCL, organism sensitive to the same and no further patient follow-up is required at this time.  Genia Del 03/28/2016, 11:13 AM

## 2016-03-31 ENCOUNTER — Ambulatory Visit (INDEPENDENT_AMBULATORY_CARE_PROVIDER_SITE_OTHER): Payer: No Typology Code available for payment source

## 2016-03-31 ENCOUNTER — Ambulatory Visit (HOSPITAL_COMMUNITY)
Admission: EM | Admit: 2016-03-31 | Discharge: 2016-03-31 | Disposition: A | Discharge status: Home / Self Care | Payer: Self-pay | Attending: Emergency Medicine | Admitting: Emergency Medicine

## 2016-03-31 ENCOUNTER — Encounter (HOSPITAL_COMMUNITY): Payer: Self-pay | Admitting: Emergency Medicine

## 2016-03-31 DIAGNOSIS — R0602 Shortness of breath: Secondary | ICD-10-CM

## 2016-03-31 NOTE — ED Triage Notes (Signed)
Seen at [facility] long last week for a uti.  Received ivf at that time, heart rate elevated per patient.   Over the weekend felt sob.  Sob intermittent.  Yesterday patient reports palpating a knot in chest.  Reports this is visible.  Patient has never noticed this before.  Patient reports sob with ambulation and normally does not feel this way per patient .   Patient is obese, large chest circumference.   Patient had difficulty sleeping last night due to sob.  Patient reports she had to sit up, but eventually was able to lie down to sleep

## 2016-03-31 NOTE — ED Notes (Signed)
Patient aware of situations where she should return to emergency department

## 2016-03-31 NOTE — ED Notes (Signed)
ekg handed to dr Alphonzo Cruise

## 2016-03-31 NOTE — Discharge Instructions (Signed)
Go ahead and finish the Cipro. Try some Pepcid and some Zantac and see if this helps. Go to the ER for the signs and symptoms we discussed. Follow up with a primary care physician of your choice. See list below.  Below is a list of primary care practices who are taking new patients for you to follow-up with.   [facility] [phone] [place] [address]  [facility]: [place]  [phone]  [place] [facility]: [address] [place]   [phone]  [facility]: [address] [place]  [phone]  [place] [facility] : [address] [place] [phone]  [facility]: [address] [place] [phone]  [facility]: [place] [phone]  Dr. Blanchie Serve [place]  [phone]  Dr. Benito Mccreedy, [facility]. [place], [place] [address]  [phone]

## 2016-03-31 NOTE — ED Notes (Signed)
Patient does have a soft, fullness to center chest

## 2016-03-31 NOTE — ED Provider Notes (Signed)
HPI  SUBJECTIVE:  Mikayla Rios is a 30 y.o. female who presents with intermittent, minutes long shortness of breath for 6 days. States that this started after she was given IV fluids and Cipro in the [place] long ED for UTI. She is currently in day #5 of Cipro for her UTI. She reports a an occasional cough productive of clear mucus. sx are worse lying down flat, better with sitting up. She has not tried anything for this. She reports chest pain described as soreness after carrying a pot of water 1-2 days ago. This is worse with torso rotation and arm movement. however, she reports a sensation of "something sitting on my chest last night" while walking around. States that this is the first time these symptoms ever happened. It lasted several minutes and then resolved on its own. She denies nausea, diaphoresis, palpitations, presyncope or syncope during this episode. No worsening of shortness of breath, water brash or burning chest pain. She was able to walk around today without having recurrent  symptoms. She reports having a sleep on 3 pillows last night, and some nocturia. states that she is urinating several times a night, but she has increased her fluids to help with this UTI. She reports shortness of breath with exertion today, but has not had to stop and rest. No nasal congestion, postnasal drip, rhinorrhea, no wheezing, hemoptysis, fevers. No lower extremity edema, weight gain, abdominal pain, PND. No calf pain, swelling, immobilization. No surgery in the past 4 weeks. No history of PE, DVT, hypercoagulability, cancer. No history of GERD, MI, diabetes, hypertension. No history of CHF, asthma, emphysema, COPD. No exogenous estrogen. No smoking. No history of kidney disease. Patient is morbidly obese. LMP: 9/26. Denies possibility pregnant. States her urine pregnancy was  negative 6 days ago and she has not been sexually active since then. Family history negative for early MI. PMD: None.  Patient states  that she has not taken the Cipro today and her shortness of breath has gotten better. She also reports exposure to a gas leak several days ago, but the shortness of breath that started prior to that.   Past Medical History:  Diagnosis Date  . Blood infection ([place]) 1985  . Blood transfusion without reported diagnosis     Past Surgical History:  Procedure Laterality Date  . NO PAST SURGERIES      Family History  Problem Relation Age of Onset  . Cancer Mother     breast  . Diabetes Mother   . Hypertension Mother   . Autism Son   . Heart disease Paternal Aunt   . Cancer Paternal Uncle     colon  . Diabetes Maternal Grandmother   . Hypertension Maternal Grandmother   . Diabetes Maternal Grandfather   . Kidney disease Maternal Grandfather   . Diabetes Paternal Grandmother   . Cancer Paternal Grandfather     colon    Social History  Substance Use Topics  . Smoking status: Never Smoker  . Smokeless tobacco: Never Used  . Alcohol use No    No current facility-administered medications for this encounter.   Current Outpatient Prescriptions:  .  OVER THE COUNTER MEDICATION, otc pain reliever, Disp: , Rfl:  .  ciprofloxacin (CIPRO) 500 MG tablet, Take 1 tablet (500 mg total) by mouth 2 (two) times daily., Disp: 14 tablet, Rfl: 0  Allergies  Allergen Reactions  . Shellfish Allergy Hives  . Penicillins Hives and Rash    Has patient had a  PCN reaction causing immediate rash, facial/tongue/throat swelling, SOB or lightheadedness with hypotension: No Has patient had a PCN reaction causing severe rash involving mucus membranes or skin necrosis: hives Has patient had a PCN reaction that required hospitalization No Has patient had a PCN reaction occurring within the last 10 years: yes If all of the above answers are "NO", then may proceed with Cephalosporin use.      ROS  As noted in HPI.   Physical Exam  BP 149/95 (BP Location: Left Arm)   Pulse 103   Temp 98.6 F (37  C) (Oral)   Resp 20   LMP 03/02/2016 (Exact Date)   SpO2 100%    Wt Readings from Last 3 Encounters:  12/15/12 (!) 341 lb (154.7 kg)  11/22/12 (!) 340 lb (154.2 kg)   Temp Readings from Last 3 Encounters:  03/31/16 98.6 F (37 C) (Oral)  03/25/16 99.3 F (37.4 C)  05/26/13 98.7 F (37.1 C) (Oral)   BP Readings from Last 3 Encounters:  03/31/16 149/95  03/25/16 154/99  05/26/13 (!) 165/108   Pulse Readings from Last 3 Encounters:  03/31/16 103  03/25/16 106  05/26/13 98   Constitutional: Well developed, well nourished, no acute distress.  patient able tolerate lying flat. Eyes: PERRL, EOMI, conjunctiva normal bilaterally HENT: Normocephalic, atraumatic,mucus membranes moist Respiratory: Clear to auscultation bilaterally, no rales, no wheezing, no rhonchi Cardiovascular: Normal rate and rhythm, no murmurs, no gallops, no rubs no JVD GI: Soft, nondistended, normal bowel sounds, nontender, no rebound, no guarding skin: No rash, skin intact Musculoskeletal: Calves symmetric, nontender No edema, no tenderness, no deformities Neurologic: Alert & oriented x 3, CN II-XII grossly intact, no motor deficits, sensation grossly intact Psychiatric: Speech and behavior appropriate   ED Course   Medications - No data to display  Orders Placed This Encounter  Procedures  . DG Chest 2 View    Standing Status:   Standing    Number of Occurrences:   1    Order Specific Question:   Reason for Exam (SYMPTOM  OR DIAGNOSIS REQUIRED)    Answer:   SOB x 1 week r/o pulm edema, effusion, PNA  . ED EKG    Standing Status:   Standing    Number of Occurrences:   1    Order Specific Question:   Reason for Exam    Answer:   Shortness of breath   No results found for this or any previous visit (from the past 24 hour(s)). Dg Chest 2 View  Result Date: 03/31/2016 CLINICAL DATA:  Per pt: SOB started about four days ago, went to [facility] and was put on Cepro and an IV. Last night a lump came up on  the center, patient pointed to the mid to slightly superior sternum, patient became SOB again last night, could only breath normally while lying on the left side. Non-smoker. No history of cardiac disease. History of Bronchitis. Patient is hyper-stenic. Patient is not a diabetic. EXAM: CHEST  2 VIEW COMPARISON:  09/01/2009 FINDINGS: Cardiac silhouette is borderline enlarged. Normal mediastinal and hilar contours. Clear lungs.  No pleural effusion or pneumothorax. Skeletal structures are unremarkable. IMPRESSION: No active cardiopulmonary disease. Electronically Signed   By: Lajean Manes M.D.   On: 03/31/2016 11:31    ED Clinical Impression  Shortness of breath   ED Assessment/Plan  Chest x-ray independently reviewed. Borderline cardiomegaly. No pleural effusion, pneumothorax. No active cardiopulmonary disease per radiology. See radiology report for details.  EKG: Normal  sinus rhythm, rate 112, normal axis, normal intervals. No hypertrophy. No ST-T wave changes concerning for ischemia. No previous EKG for comparison.  In The differential is PE, doubt DVT. However, patient denies any pleuritic chest pain and is satting 100% on room air. Has only mild tachycardia. Doubt asthma, emphysema, COPD. Doubt pneumonia. In the differential is  CHF, however she does not have any physical findings of this, but will get chest x-ray To rule out any acute changes. Also getting EKG given history of chest pressure last night. She is asymptomatic at this point in ime.  Modified well's criteria 1/ 9. She is low risk for PE. Her sx may be due to acid reflux from the Cipro since symptoms started after taking this. Advised her to try some Pepcid and some Zantac, to stay with from pulmonary irritants. Offered to send patient to the ED for comprehensive workup today, but she is willing to go home and try these things first, and if she has any worsening of her symptoms, return of her chest pain or other concerns, she is to go  to the ED immediately. She is comfortable with this plan.  Will also provide primary care referral list.  Discussed  imaging, MDM, plan and followup . Discussed sn/sx that should prompt return to the ED. Patient agrees with plan.   Meds ordered this encounter  Medications  . OVER THE COUNTER MEDICATION    Sig: otc pain reliever    *This clinic note was created using [employer]. Therefore, there may be occasional mistakes despite careful proofreading.  ?   Melynda Ripple, MD 03/31/16 2133

## 2016-07-22 ENCOUNTER — Ambulatory Visit (HOSPITAL_COMMUNITY)
Admission: EM | Admit: 2016-07-22 | Discharge: 2016-07-22 | Disposition: A | Discharge status: Home / Self Care | Payer: Self-pay | Attending: Internal Medicine | Admitting: Internal Medicine

## 2016-07-22 DIAGNOSIS — J40 Bronchitis, not specified as acute or chronic: Secondary | ICD-10-CM

## 2016-07-22 DIAGNOSIS — R059 Cough, unspecified: Secondary | ICD-10-CM

## 2016-07-22 DIAGNOSIS — R05 Cough: Secondary | ICD-10-CM

## 2016-07-22 MED ORDER — BENZONATATE 100 MG PO CAPS: 200.0000 mg | ORAL_CAPSULE | Freq: Three times a day (TID) | ORAL | 0 refills | Status: DC | PRN

## 2016-07-22 MED ORDER — AZITHROMYCIN 250 MG PO TABS: 250.0000 mg | ORAL_TABLET | Freq: Every day | ORAL | 0 refills | Status: DC

## 2016-07-22 MED ORDER — ALBUTEROL SULFATE HFA 108 (90 BASE) MCG/ACT IN AERS: 1.0000 | INHALATION_SPRAY | Freq: Four times a day (QID) | RESPIRATORY_TRACT | 0 refills | Status: DC | PRN

## 2016-07-22 MED ORDER — METHYLPREDNISOLONE 4 MG PO TBPK: ORAL_TABLET | ORAL | 0 refills | Status: DC

## 2016-07-22 NOTE — ED Triage Notes (Signed)
States on Saturday  patient had a cough with a sore throat, headache, ear ache, diarrhea and fever otc meds used as tx Denies vomiting  States she has a little sob

## 2016-07-22 NOTE — ED Provider Notes (Signed)
CSN: [id number]     Arrival date & time 07/22/16  1237 History   First MD Initiated Contact with Patient 07/22/16 1334     Chief Complaint  Patient presents with  . Fever   (Consider location/radiation/quality/duration/timing/severity/associated sxs/prior Treatment) Patient c/o cough and SOB.  She states she wheezes at night.   The history is provided by the patient.  Cough  Cough characteristics:  Non-productive Sputum characteristics:  White Severity:  Moderate Onset quality:  Gradual Duration:  1 week Timing:  Constant Progression:  Worsening Chronicity:  New Smoker: no   Context: upper respiratory infection and weather changes   Relieved by:  Nothing Worsened by:  Nothing Ineffective treatments:  None tried   Past Medical History:  Diagnosis Date  . Blood infection ([place]) 1985  . Blood transfusion without reported diagnosis    Past Surgical History:  Procedure Laterality Date  . NO PAST SURGERIES     Family History  Problem Relation Age of Onset  . Cancer Mother     breast  . Diabetes Mother   . Hypertension Mother   . Autism Son   . Heart disease Paternal Aunt   . Cancer Paternal Uncle     colon  . Diabetes Maternal Grandmother   . Hypertension Maternal Grandmother   . Diabetes Maternal Grandfather   . Kidney disease Maternal Grandfather   . Diabetes Paternal Grandmother   . Cancer Paternal Grandfather     colon   Social History  Substance Use Topics  . Smoking status: Never Smoker  . Smokeless tobacco: Never Used  . Alcohol use No   OB History    Gravida Para Term Preterm AB Living   3 3 3  0 0 3   SAB TAB Ectopic Multiple Live Births   0 0 0 0 3     Review of Systems  Constitutional: Negative.   HENT: Negative.   Eyes: Negative.   Respiratory: Positive for cough.   Cardiovascular: Negative.   Gastrointestinal: Negative.   Endocrine: Negative.   Genitourinary: Negative.   Musculoskeletal: Negative.   Allergic/Immunologic: Negative.    Neurological: Negative.   Hematological: Negative.   Psychiatric/Behavioral: Negative.     Allergies  Shellfish allergy and Penicillins  Home Medications   Prior to Admission medications   Medication Sig Start Date End Date Taking? Authorizing Provider  albuterol (PROVENTIL HFA;VENTOLIN HFA) 108 (90 Base) MCG/ACT inhaler Inhale 1-2 puffs into the lungs every 6 (six) hours as needed for wheezing or shortness of breath. 07/22/16   Lysbeth Penner, FNP  azithromycin (ZITHROMAX) 250 MG tablet Take 1 tablet (250 mg total) by mouth daily. Take first 2 tablets together, then 1 every day until finished. 07/22/16   Lysbeth Penner, FNP  benzonatate (TESSALON) 100 MG capsule Take 2 capsules (200 mg total) by mouth 3 (three) times daily as needed for cough. 07/22/16   Lysbeth Penner, FNP  ciprofloxacin (CIPRO) 500 MG tablet Take 1 tablet (500 mg total) by mouth 2 (two) times daily. 03/25/16   Fredia Sorrow, MD  methylPREDNISolone (MEDROL DOSEPAK) 4 MG TBPK tablet Take 6-5-4-3-2-1 po qd 07/22/16   Lysbeth Penner, FNP  OVER THE COUNTER MEDICATION otc pain reliever    Historical Provider, MD   Meds Ordered and Administered this Visit  Medications - No data to display  BP (!) 136/102 (BP Location: Right Arm)   Pulse 117   Temp 99.3 F (37.4 C) (Oral)   Resp 18   LMP 07/17/2016  SpO2 100%  No data found.   Physical Exam  Constitutional: She appears well-developed and well-nourished.  HENT:  Head: Normocephalic and atraumatic.  Right Ear: External ear normal.  Left Ear: External ear normal.  Mouth/Throat: Oropharynx is clear and moist.  Eyes: Conjunctivae and EOM are normal. Pupils are equal, round, and reactive to light.  Neck: Normal range of motion. Neck supple.  Cardiovascular: Normal rate, regular rhythm and normal heart sounds.   Pulmonary/Chest: Effort normal.  BBS with diminished breath sounds.  Abdominal: Soft. Bowel sounds are normal.  Nursing note and vitals  reviewed.   Urgent Care Course     Procedures (including critical care time)  Labs Review Labs Reviewed - No data to display  Imaging Review No results found.   Visual Acuity Review  Right Eye Distance:   Left Eye Distance:   Bilateral Distance:    Right Eye Near:   Left Eye Near:    Bilateral Near:         MDM   1. Bronchitis   2. Cough    Zpak Medrol dose pack Albuterol MDI Tessalon Perles  Push po fluids, rest, tylenol and motrin otc prn as directed for fever, arthralgias, and myalgias.  Follow up prn if sx's continue or persist.    Lysbeth Penner, FNP 07/22/16 [place], [place] 07/22/16 [phone]

## 2017-11-29 ENCOUNTER — Emergency Department (HOSPITAL_BASED_OUTPATIENT_CLINIC_OR_DEPARTMENT_OTHER)
Admission: EM | Admit: 2017-11-29 | Discharge: 2017-11-29 | Disposition: A | Discharge status: Home / Self Care | Payer: No Typology Code available for payment source | Attending: Physician Assistant | Admitting: Physician Assistant

## 2017-11-29 ENCOUNTER — Encounter (HOSPITAL_BASED_OUTPATIENT_CLINIC_OR_DEPARTMENT_OTHER): Payer: Self-pay

## 2017-11-29 ENCOUNTER — Other Ambulatory Visit: Payer: Self-pay

## 2017-11-29 ENCOUNTER — Emergency Department (HOSPITAL_BASED_OUTPATIENT_CLINIC_OR_DEPARTMENT_OTHER): Payer: No Typology Code available for payment source

## 2017-11-29 DIAGNOSIS — Z79899 Other long term (current) drug therapy: Secondary | ICD-10-CM | POA: Diagnosis not present

## 2017-11-29 DIAGNOSIS — Y929 Unspecified place or not applicable: Secondary | ICD-10-CM | POA: Diagnosis not present

## 2017-11-29 DIAGNOSIS — Y999 Unspecified external cause status: Secondary | ICD-10-CM | POA: Diagnosis not present

## 2017-11-29 DIAGNOSIS — X500XXA Overexertion from strenuous movement or load, initial encounter: Secondary | ICD-10-CM | POA: Diagnosis not present

## 2017-11-29 DIAGNOSIS — S86912A Strain of unspecified muscle(s) and tendon(s) at lower leg level, left leg, initial encounter: Secondary | ICD-10-CM

## 2017-11-29 DIAGNOSIS — S8992XA Unspecified injury of left lower leg, initial encounter: Secondary | ICD-10-CM | POA: Diagnosis present

## 2017-11-29 DIAGNOSIS — Y9301 Activity, walking, marching and hiking: Secondary | ICD-10-CM | POA: Insufficient documentation

## 2017-11-29 NOTE — ED Provider Notes (Signed)
[facility] EMERGENCY DEPARTMENT Provider Note  CSN: [id number] Arrival date & time: 11/29/17  1114  History   Chief Complaint Chief Complaint  Patient presents with  . Knee Pain    HPI Mikayla Rios is a 32 y.o. female with no significant medical history who presented to the ED for left knee pain x5 days. Patient states she twisted her left knee when walking to the kitchen last Thursday 11/24/17. She denies falling or having any direct impact on the knee. Endorses pain on the lower aspect of the knee. Denies swelling, erythema or deformity. She is able to bear weight and ambulates, but endorses mild pain when doing so. Denies paresthesias, weakness, foot drop or problems with other extremities.   Past Medical History:  Diagnosis Date  . Blood infection ([place]) 1985  . Blood transfusion without reported diagnosis     Patient Active Problem List   Diagnosis Date Noted  . SVD (spontaneous vaginal delivery) 12/16/2012    Past Surgical History:  Procedure Laterality Date  . NO PAST SURGERIES       OB History    Gravida  3   Para  3   Term  3   Preterm  0   AB  0   Living  3     SAB  0   TAB  0   Ectopic  0   Multiple  0   Live Births  3            Home Medications    Prior to Admission medications   Medication Sig Start Date End Date Taking? Authorizing Provider  albuterol (PROVENTIL HFA;VENTOLIN HFA) 108 (90 Base) MCG/ACT inhaler Inhale 1-2 puffs into the lungs every 6 (six) hours as needed for wheezing or shortness of breath. 07/22/16   Lysbeth Penner, FNP  azithromycin (ZITHROMAX) 250 MG tablet Take 1 tablet (250 mg total) by mouth daily. Take first 2 tablets together, then 1 every day until finished. 07/22/16   Lysbeth Penner, FNP  benzonatate (TESSALON) 100 MG capsule Take 2 capsules (200 mg total) by mouth 3 (three) times daily as needed for cough. 07/22/16   Lysbeth Penner, FNP  ciprofloxacin (CIPRO) 500 MG tablet Take 1 tablet  (500 mg total) by mouth 2 (two) times daily. 03/25/16   Fredia Sorrow, MD  methylPREDNISolone (MEDROL DOSEPAK) 4 MG TBPK tablet Take 6-5-4-3-2-1 po qd 07/22/16   Lysbeth Penner, FNP  OVER THE COUNTER MEDICATION otc pain reliever    [provider]    Family History Family History  Problem Relation Age of Onset  . Cancer Mother        breast  . Diabetes Mother   . Hypertension Mother   . Autism Son   . Heart disease Paternal Aunt   . Cancer Paternal Uncle        colon  . Diabetes Maternal Grandmother   . Hypertension Maternal Grandmother   . Diabetes Maternal Grandfather   . Kidney disease Maternal Grandfather   . Diabetes Paternal Grandmother   . Cancer Paternal Grandfather        colon    Social History Social History   Tobacco Use  . Smoking status: Never Smoker  . Smokeless tobacco: Never Used  Substance Use Topics  . Alcohol use: Yes    Comment: occ  . Drug use: No     Allergies   Shellfish allergy and Penicillins   Review of Systems Review of Systems  Constitutional: Negative.   Musculoskeletal: Positive for arthralgias. Negative for back pain, joint swelling and myalgias.  Skin: Negative.   Neurological: Negative.   Hematological: Negative.      Physical Exam Updated Vital Signs BP (!) 155/107 (BP Location: Left Arm)   Pulse (!) 108   Temp 98.8 F (37.1 C) (Oral)   Resp 18   Ht 5\' 3"  (1.6 m)   Wt (!) 154 kg (339 lb 8.1 oz)   LMP 11/18/2017   SpO2 100%   BMI 60.14 kg/m   Physical Exam  Constitutional: She appears well-developed and well-nourished. No distress.  Obese.   Cardiovascular:  Pulses:      Dorsalis pedis pulses are 2+ on the right side, and 2+ on the left side.       Posterior tibial pulses are 2+ on the right side, and 2+ on the left side.  Musculoskeletal: Normal range of motion.       Right knee: Normal.       Left knee: She exhibits no swelling, no effusion, no deformity, normal alignment, normal patellar  mobility, normal meniscus and no MCL laxity. Tenderness found. Patellar tendon tenderness noted.       Right ankle: Normal.       Left ankle: Normal.  Patient able to bear weight and ambulate without issue or assistance.  Neurological: She has normal strength. No sensory deficit. She exhibits normal muscle tone.  Skin: Skin is warm, dry and intact.  Nursing note and vitals reviewed.    ED Treatments / Results  Labs (all labs ordered are listed, but only abnormal results are displayed) Labs Reviewed - No data to display  EKG None  Radiology Dg Knee Complete 4 Views Left  Result Date: 11/29/2017 CLINICAL DATA:  Left knee pain following fall, initial encounter EXAM: LEFT KNEE - COMPLETE 4+ VIEW COMPARISON:  08/26/2007 FINDINGS: No evidence of fracture, dislocation, or joint effusion. No evidence of arthropathy or other focal bone abnormality. Soft tissues are unremarkable. IMPRESSION: No acute abnormality noted. Electronically Signed   By: Inez Catalina M.D.   On: 11/29/2017 12:02    Procedures Procedures (including critical care time)  Medications Ordered in ED Medications - No data to display  Initial Impression / Assessment and Plan / ED Course  Triage vital signs and the nursing notes have been reviewed.  Pertinent labs & imaging results that were available during care of the patient were reviewed and considered in medical decision making (see chart for details). Clinical Course as of Nov 29 1221  Tue Nov 29, 2017  1215 Left knee x-ray normal. No fractures, dislocations or soft tissue abnormalities.   [GM]    Clinical Course User Index [GM] Franci Oshana, Jonelle Sports, PA-C   Patient is in no distress and well appearing. Physical exam is reassuring that there are no ligament tears or tibial plateau fractures. She also has full active and passive ROM with good strength. No deformities, decreased muscle tone or other abnormalities visualized. There are no other physical exam findings  or s/s that suggest an infectious or rheumatologic etiology to her pain. Patient has no focal neuro deficits. Likely MSK strain.  Final Clinical Impressions(s) / ED Diagnoses  1. Left Knee Strain. Education provided on OTC and supportive treatment. Advised to follow-up with PCP.  Dispo: Home. After thorough clinical evaluation, this patient is determined to be medically stable and can be safely discharged with the previously mentioned treatment and/or outpatient follow-up/referral(s). At this time, there are no  other apparent medical conditions that require further screening, evaluation or treatment.  Final diagnoses:  Knee strain, left, initial encounter    ED Discharge Orders    None        Romie Jumper, [place] 11/29/17 1223    Macarthur Critchley, MD 11/29/17 1443

## 2017-11-29 NOTE — Discharge Instructions (Addendum)
Your x-ray looks good! No broken bones or dislocations. You may follow-up with your PCP for this. For pain relief, you may continue using Tylenol and/or ibuprofen/Motrin/naproxen as well as cold or hot compresses.  May follow-up with a PCP or orthopedics if pain continues to persists > 4-6 weeks.

## 2017-11-29 NOTE — ED Triage Notes (Signed)
Pt states she twisted left knee last Thursday-NAD-limping gait

## 2018-04-10 ENCOUNTER — Emergency Department (HOSPITAL_BASED_OUTPATIENT_CLINIC_OR_DEPARTMENT_OTHER)
Admission: EM | Admit: 2018-04-10 | Discharge: 2018-04-10 | Disposition: A | Discharge status: Home / Self Care | Payer: BLUE CROSS/BLUE SHIELD | Attending: Emergency Medicine | Admitting: Emergency Medicine

## 2018-04-10 ENCOUNTER — Encounter (HOSPITAL_BASED_OUTPATIENT_CLINIC_OR_DEPARTMENT_OTHER): Payer: Self-pay | Admitting: *Deleted

## 2018-04-10 ENCOUNTER — Other Ambulatory Visit: Payer: Self-pay

## 2018-04-10 DIAGNOSIS — X509XXA Other and unspecified overexertion or strenuous movements or postures, initial encounter: Secondary | ICD-10-CM | POA: Diagnosis not present

## 2018-04-10 DIAGNOSIS — R9431 Abnormal electrocardiogram [ECG] [EKG]: Secondary | ICD-10-CM | POA: Diagnosis not present

## 2018-04-10 DIAGNOSIS — Y929 Unspecified place or not applicable: Secondary | ICD-10-CM | POA: Diagnosis not present

## 2018-04-10 DIAGNOSIS — S56911A Strain of unspecified muscles, fascia and tendons at forearm level, right arm, initial encounter: Secondary | ICD-10-CM | POA: Diagnosis not present

## 2018-04-10 DIAGNOSIS — Y9389 Activity, other specified: Secondary | ICD-10-CM | POA: Insufficient documentation

## 2018-04-10 DIAGNOSIS — Y998 Other external cause status: Secondary | ICD-10-CM | POA: Diagnosis not present

## 2018-04-10 DIAGNOSIS — S6991XA Unspecified injury of right wrist, hand and finger(s), initial encounter: Secondary | ICD-10-CM | POA: Diagnosis not present

## 2018-04-10 NOTE — Discharge Instructions (Addendum)
Use tylenol and motrin as needed, limit use of right arm as much as possible, no heavy lifting

## 2018-04-10 NOTE — ED Provider Notes (Signed)
[facility] DEPART[facility] Provider Note   CSN: [id number] Arrival date & time: 04/10/18  2018     History   Chief Complaint Chief Complaint  Patient presents with  . Arm Pain    HPI Mikayla Rios is a 32 y.o. female.  The history is provided by the patient.  Arm Injury   This is a new problem. The current episode started 6 to 12 hours ago. The problem occurs constantly. The problem has been gradually improving. The pain is present in the right wrist. The quality of the pain is described as aching. The pain is at a severity of 1/10. The pain is mild. Associated symptoms include stiffness. Pertinent negatives include no numbness, full range of motion, no tingling and no itching. The symptoms are aggravated by contact. She has tried nothing for the symptoms. The treatment provided no relief. There has been no history of extremity trauma.    Past Medical History:  Diagnosis Date  . Blood infection ([place]) 1985  . Blood transfusion without reported diagnosis     Patient Active Problem List   Diagnosis Date Noted  . SVD (spontaneous vaginal delivery) 12/16/2012    Past Surgical History:  Procedure Laterality Date  . NO PAST SURGERIES       OB History    Gravida  3   Para  3   Term  3   Preterm  0   AB  0   Living  3     SAB  0   TAB  0   Ectopic  0   Multiple  0   Live Births  3            Home Medications    Prior to Admission medications   Medication Sig Start Date End Date Taking? Authorizing Provider  albuterol (PROVENTIL HFA;VENTOLIN HFA) 108 (90 Base) MCG/ACT inhaler Inhale 1-2 puffs into the lungs every 6 (six) hours as needed for wheezing or shortness of breath. 07/22/16   Lysbeth Penner, FNP  azithromycin (ZITHROMAX) 250 MG tablet Take 1 tablet (250 mg total) by mouth daily. Take first 2 tablets together, then 1 every day until finished. 07/22/16   Lysbeth Penner, FNP  benzonatate (TESSALON) 100 MG capsule Take 2  capsules (200 mg total) by mouth 3 (three) times daily as needed for cough. 07/22/16   Lysbeth Penner, FNP  ciprofloxacin (CIPRO) 500 MG tablet Take 1 tablet (500 mg total) by mouth 2 (two) times daily. 03/25/16   Fredia Sorrow, MD  methylPREDNISolone (MEDROL DOSEPAK) 4 MG TBPK tablet Take 6-5-4-3-2-1 po qd 07/22/16   Lysbeth Penner, FNP  OVER THE COUNTER MEDICATION otc pain reliever    [provider]    Family History Family History  Problem Relation Age of Onset  . Cancer Mother        breast  . Diabetes Mother   . Hypertension Mother   . Autism Son   . Heart disease Paternal Aunt   . Cancer Paternal Uncle        colon  . Diabetes Maternal Grandmother   . Hypertension Maternal Grandmother   . Diabetes Maternal Grandfather   . Kidney disease Maternal Grandfather   . Diabetes Paternal Grandmother   . Cancer Paternal Grandfather        colon    Social History Social History   Tobacco Use  . Smoking status: Never Smoker  . Smokeless tobacco: Never Used  Substance Use Topics  .  Alcohol use: Yes    Comment: occ  . Drug use: No     Allergies   Shellfish allergy and Penicillins   Review of Systems Review of Systems  Constitutional: Negative for chills and fever.  HENT: Negative for ear pain and sore throat.   Eyes: Negative for pain and visual disturbance.  Respiratory: Negative for cough and shortness of breath.   Cardiovascular: Negative for chest pain and palpitations.  Gastrointestinal: Negative for abdominal pain and vomiting.  Genitourinary: Negative for dysuria and hematuria.  Musculoskeletal: Positive for myalgias and stiffness. Negative for arthralgias, back pain and neck pain.  Skin: Negative for color change, itching and rash.  Neurological: Negative for tingling, seizures, syncope and numbness.  All other systems reviewed and are negative.    Physical Exam Updated Vital Signs  ED Triage Vitals  Enc Vitals Group     BP 04/10/18  2030 (!) 184/128     Pulse Rate 04/10/18 2030 (!) 106     Resp 04/10/18 2030 20     Temp 04/10/18 2030 98.6 F (37 C)     Temp Source 04/10/18 2030 Oral     SpO2 04/10/18 2030 100 %     Weight 04/10/18 2032 (!) 323 lb (146.5 kg)     Height 04/10/18 2032 5\' 3"  (1.6 m)     Head Circumference --      Peak Flow --      Pain Score 04/10/18 2040 8     Pain Loc --      Pain Edu? --      Excl. in [place]? --     Physical Exam  Constitutional: She is oriented to person, place, and time. She appears well-developed and well-nourished. No distress.  HENT:  Head: Normocephalic and atraumatic.  Eyes: Pupils are equal, round, and reactive to light. Conjunctivae and EOM are normal.  Neck: Normal range of motion. Neck supple.  Cardiovascular: Normal rate, regular rhythm and intact distal pulses.  No murmur heard. Pulmonary/Chest: Effort normal and breath sounds normal. No respiratory distress.  Abdominal: Soft. There is no tenderness.  Musculoskeletal: Normal range of motion. She exhibits no edema.  TTP and right forearm area within the muscle belly  Neurological: She is alert and oriented to person, place, and time.  5+ out of 5 strength in the right upper extremity, normal sensation  Skin: Skin is warm and dry. Capillary refill takes less than 2 seconds.  Psychiatric: She has a normal mood and affect.  Nursing note and vitals reviewed.    ED Treatments / Results  Labs (all labs ordered are listed, but only abnormal results are displayed) Labs Reviewed - No data to display  EKG None  Radiology No results found.  Procedures Procedures (including critical care time)  Medications Ordered in ED Medications - No data to display   Initial Impression / Assessment and Plan / ED Course  I have reviewed the triage vital signs and the nursing notes.  Pertinent labs & imaging results that were available during my care of the patient were reviewed by me and considered in my medical decision  making (see chart for details).     Mikayla Rios is a 32 year old female with no significant medical history who presents to the ED with right forearm pain.  Patient with unremarkable vitals.  No fever.  Patient with pain in the right forearm after opening the door today.  She felt as if her arm pulled.  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  She has some tenderness within the right forearm muscles.  Patient is neurologically neurovascularly and neuromuscularly intact.  No concern for complete ligamentous injury.  Patient has good pulses, good strength, good sensation in the right upper extremity.  No obvious deficits.  Suspect mild muscle strain in the forearm.  Possibly some overuse injury as well.  Recommend Tylenol, Motrin, light activity with the right arm.  Recommend follow-up with primary care doctor as she may benefit from physical therapy or other further diagnostics.  No concern for fracture as no history of direct trauma.  This chart was dictated using voice recognition software.  Despite best efforts to proofread,  errors can occur which can change the documentation meaning.   Final Clinical Impressions(s) / ED Diagnoses   Final diagnoses:  Forearm strain, right, initial encounter    ED Discharge Orders    None       Lennice Sites, DO 04/10/18 2136

## 2018-04-10 NOTE — ED Triage Notes (Signed)
Pain in her right forearm and hand for a while. Today she grabbed the car door handle and felt a crack in her hand.

## 2018-04-10 NOTE — ED Notes (Signed)
Pt to follow up with PCP related to High BP.

## 2018-04-18 ENCOUNTER — Encounter: Payer: Self-pay | Admitting: Internal Medicine

## 2018-04-18 ENCOUNTER — Ambulatory Visit: Payer: BLUE CROSS/BLUE SHIELD | Admitting: Internal Medicine

## 2018-04-18 VITALS — BP 140/90 | HR 107 | Temp 98.3°F | Ht 63.0 in | Wt 338.4 lb

## 2018-04-18 DIAGNOSIS — E669 Obesity, unspecified: Secondary | ICD-10-CM | POA: Insufficient documentation

## 2018-04-18 DIAGNOSIS — I10 Essential (primary) hypertension: Secondary | ICD-10-CM | POA: Diagnosis not present

## 2018-04-18 DIAGNOSIS — M25512 Pain in left shoulder: Secondary | ICD-10-CM

## 2018-04-18 DIAGNOSIS — M79641 Pain in right hand: Secondary | ICD-10-CM | POA: Diagnosis not present

## 2018-04-18 DIAGNOSIS — Z6841 Body Mass Index (BMI) 40.0 and over, adult: Secondary | ICD-10-CM

## 2018-04-18 DIAGNOSIS — M79642 Pain in left hand: Secondary | ICD-10-CM | POA: Diagnosis not present

## 2018-04-18 DIAGNOSIS — G5601 Carpal tunnel syndrome, right upper limb: Secondary | ICD-10-CM | POA: Diagnosis not present

## 2018-04-18 MED ORDER — LISINOPRIL 2.5 MG PO TABS: 2.5000 mg | ORAL_TABLET | Freq: Every day | ORAL | 0 refills | Status: DC

## 2018-04-18 NOTE — Progress Notes (Signed)
Subjective:     Patient ID: Mikayla Rios , female    DOB: 05-16-1986 , 32 y.o.   MRN: [id number]  Chief Complaint  Patient presents with  . New Patient (Initial Visit)  . Follow-up    would like to discuss BP   . Hand Pain    c/o hand pain, stifness, numbness   . Allergies    would like a referral to see an allergist has an allergic reaction to both tampons and pads   . Follow-up    colon cancer runs in family and would like to get tested     HPI 1- Having a lot of pain on her L arm from fingers to shoulder x 1 months. Pain is off and on and in the past 2 weeks has been constant. Pain is decrebed as shart at times, and achy fatigued. At night time feels numbness on all finger tips except thumb. She types a lot for work, and has not reported. No changes with her computer stating.   2- R thenar pain 2 weeks ago, then Monday when she was opening the door she felt pain shot from her palp to elbow. Since than she has a throbbing pain in the center of her palm, small infger and radiates to her forearm.  Has been using ice, ieat and Ibuprofen.  She does a lot of typing. She is R hand dominant.   3- Went to ER for R hand pain  11/4 and her pulse rate was normal, but right as she her chest was exposed to do an EKG, someone barged in the room and since then her pulse has been elevated. And diagnosed with HTN But was thought to be due to pain. She has not been checking it at home.    Her EKG was nl in the ER.  Past Medical History:  Diagnosis Date  . Blood infection ([place]) 1985  . Blood transfusion without reported diagnosis      Family History  Problem Relation Age of Onset  . Cancer Mother        breast, and in remission  . Diabetes Mother   . Hypertension Mother   . Autism Son   . Heart disease Paternal Aunt   . Cancer Paternal Uncle        colon  . Diabetes Maternal Grandmother   . Hypertension Maternal Grandmother   . Diabetes Maternal Grandfather   . Kidney disease Maternal  Grandfather   . Diabetes Paternal Grandmother   . Cancer Paternal Grandfather        colon  . Congestive Heart Failure Father     No current outpatient medications on file.   Allergies  Allergen Reactions  . Shellfish Allergy Hives  . Penicillins Hives and Rash    Has patient had a PCN reaction causing immediate rash, facial/tongue/throat swelling, SOB or lightheadedness with hypotension: No Has patient had a PCN reaction causing severe rash involving mucus membranes or skin necrosis: hives Has patient had a PCN reaction that required hospitalization No Has patient had a PCN reaction occurring within the last 10 years: yes If all of the above answers are "NO", then may proceed with Cephalosporin use.      Review of Systems  Constitutional: Positive for unexpected weight change. Negative for appetite change and chills.  HENT: Negative.   Respiratory: Negative for cough, chest tightness and shortness of breath.   Cardiovascular: Negative for chest pain, palpitations and leg swelling.  Musculoskeletal: Positive for  arthralgias, joint swelling and myalgias. Negative for back pain.  Skin: Negative for wound.  Neurological: Positive for dizziness and headaches. Negative for weakness and light-headedness.       Snores some per husband. Denies hypersomnia.  Has hx of intermittent vertigo.     Today's Vitals   04/18/18 1146  BP: 140/90  Pulse: (!) 107  Temp: 98.3 F (36.8 C)  TempSrc: Oral  SpO2: 98%  Weight: (!) 338 lb 6.4 oz (153.5 kg)  Height: 5\' 3"  (1.6 m)   Body mass index is 59.94 kg/m.   Objective:  Physical Exam  Constitutional: She is oriented to person, place, and time. She appears well-developed and well-nourished. No distress.  Morbid obese patient  HENT:  Head: Normocephalic.  Right Ear: External ear normal.  Left Ear: External ear normal.  Nose: Nose normal.  Eyes: Conjunctivae are normal. Left eye exhibits discharge. No scleral icterus.  Neck: No  thyromegaly present.  Cardiovascular: Regular rhythm and normal heart sounds.  No murmur heard. Slightly tachy  Pulmonary/Chest: Breath sounds normal. No respiratory distress.  Musculoskeletal:  L shoulder- painful anteriorly on bicep head region and deltoid and bicep muscle. Also has muscular tenderness on proximal radial muscle region on forearm. Neg impingement. Pain provoked with raising arm up anteriorly.   HANDS- has local tenderness of both soft tissue of thenar region, central palms, but fingers are not tenders. Has soft tissue tenderness on medical volar wrist. ROM of wrist and fingers are normal, but ROM of wrist provoked pain. Neg Tinel's  And Phalan's test after 60 seconds and when done she said only felt slight tingling on the tip of her R medial and mid finger.    Lymphadenopathy:    She has no cervical adenopathy.  Neurological: She is alert and oriented to person, place, and time. She displays normal reflexes. She exhibits normal muscle tone.  L hand grip 4/5, R hand grip 5/5  Skin: Skin is warm and dry. She is not diaphoretic.  Has dark pigment on neck thoughout  Psychiatric: She has a normal mood and affect. Her behavior is normal. Judgment and thought content normal.        Assessment And Plan:    1. Essential hypertension- acute. I started her on Lisinopril 2.5 mg qd and needs to do BP diaries. Rx for BP cuff given.  - CBC no Diff - CMP14 + Anion Gap - TSH - T3, free - T4, Free - Lipid Profile - lisinopril (ZESTRIL) 2.5 MG tablet; Take 1 tablet (2.5 mg total) by mouth daily.  Dispense: 30 tablet; Refill: 0  2. Bilateral hand pain- acute, I gave her rx for wrist splints to wear as instructed( see pt instructions) May take Tylenol up to 1000 mg q 6h prn pain. I taught her about inflammatory foods and which ones she needs to get off.  - Ambulatory referral to Physical Therapy  3. Acute pain of left shoulder- acute - Ambulatory referral to Physical Therapy 4- Morbid  obesity- she will work on using [employer] to journal what she eats, since she states she does not eat that much and stays heavy. I discussed that maybe down the road we may consider medication to help her loose wt, but we will see her labs first. May even consider sending her to Dr Migdalia Dk Wt loss program, so she can get more consistent assistance with wt loss.   Mikayla Netzer [name], PA-C

## 2018-04-18 NOTE — Patient Instructions (Addendum)
Wear the wrist splits 24/7 for one week, then only when you type  Take Tylenol 1000 mg every 6h for pain.  You can also try Arnica cream which is sold at [facility] to apply for inflammation.  Avoid inflammatory foods like: dairy, wheat, sugars, alcohol and caffeine.   Do blood pressure diaries and bring them to me.     Managing Your Hypertension Hypertension is commonly called high blood pressure. This is when the force of your blood pressing against the walls of your arteries is too strong. Arteries are blood vessels that carry blood from your heart throughout your body. Hypertension forces the heart to work harder to pump blood, and may cause the arteries to become narrow or stiff. Having untreated or uncontrolled hypertension can cause heart attack, stroke, kidney disease, and other problems. What are blood pressure readings? A blood pressure reading consists of a higher number over a lower number. Ideally, your blood pressure should be below 120/80. The first ("top") number is called the systolic pressure. It is a measure of the pressure in your arteries as your heart beats. The second ("bottom") number is called the diastolic pressure. It is a measure of the pressure in your arteries as the heart relaxes. What does my blood pressure reading mean? Blood pressure is classified into four stages. Based on your blood pressure reading, your health care provider may use the following stages to determine what type of treatment you need, if any. Systolic pressure and diastolic pressure are measured in a unit called mm Hg. Normal  Systolic pressure: below 950.  Diastolic pressure: below 80. Elevated  Systolic pressure: 932-671.  Diastolic pressure: below 80. Hypertension stage 1  Systolic pressure: 245-809.  Diastolic pressure: 98-33. Hypertension stage 2  Systolic pressure: 825 or above.  Diastolic pressure: 90 or above. What health risks are associated with hypertension? Managing  your hypertension is an important responsibility. Uncontrolled hypertension can lead to:  A heart attack.  A stroke.  A weakened blood vessel (aneurysm).  Heart failure.  Kidney damage.  Eye damage.  Metabolic syndrome.  Memory and concentration problems.  What changes can I make to manage my hypertension? Hypertension can be managed by making lifestyle changes and possibly by taking medicines. Your health care provider will help you make a plan to bring your blood pressure within a normal range. Eating and drinking  Eat a diet that is high in fiber and potassium, and low in salt (sodium), added sugar, and fat. An example eating plan is called the DASH (Dietary Approaches to Stop Hypertension) diet. To eat this way: ? Eat plenty of fresh fruits and vegetables. Try to fill half of your plate at each meal with fruits and vegetables. ? Eat whole grains, such as whole wheat pasta, brown rice, or whole grain bread. Fill about one quarter of your plate with whole grains. ? Eat low-fat diary products. ? Avoid fatty cuts of meat, processed or cured meats, and poultry with skin. Fill about one quarter of your plate with lean proteins such as fish, chicken without skin, beans, eggs, and tofu. ? Avoid premade and processed foods. These tend to be higher in sodium, added sugar, and fat.  Reduce your daily sodium intake. Most people with hypertension should eat less than 1,500 mg of sodium a day.  Limit alcohol intake to no more than 1 drink a day for nonpregnant women and 2 drinks a day for men. One drink equals 12 oz of beer, 5 oz of wine,  or 1 oz of hard liquor. Lifestyle  Work with your health care provider to maintain a healthy body weight, or to lose weight. Ask what an ideal weight is for you.  Get at least 30 minutes of exercise that causes your heart to beat faster (aerobic exercise) most days of the week. Activities may include walking, swimming, or biking.  Include exercise to  strengthen your muscles (resistance exercise), such as weight lifting, as part of your weekly exercise routine. Try to do these types of exercises for 30 minutes at least 3 days a week.  Do not use any products that contain nicotine or tobacco, such as cigarettes and e-cigarettes. If you need help quitting, ask your health care provider.  Control any long-term (chronic) conditions you have, such as high cholesterol or diabetes. Monitoring  Monitor your blood pressure at home as told by your health care provider. Your personal target blood pressure may vary depending on your medical conditions, your age, and other factors.  Have your blood pressure checked regularly, as often as told by your health care provider. Working with your health care provider  Review all the medicines you take with your health care provider because there may be side effects or interactions.  Talk with your health care provider about your diet, exercise habits, and other lifestyle factors that may be contributing to hypertension.  Visit your health care provider regularly. Your health care provider can help you create and adjust your plan for managing hypertension. Will I need medicine to control my blood pressure? Your health care provider may prescribe medicine if lifestyle changes are not enough to get your blood pressure under control, and if:  Your systolic blood pressure is 130 or higher.  Your diastolic blood pressure is 80 or higher.  Take medicines only as told by your health care provider. Follow the directions carefully. Blood pressure medicines must be taken as prescribed. The medicine does not work as well when you skip doses. Skipping doses also puts you at risk for problems. Contact a health care provider if:  You think you are having a reaction to medicines you have taken.  You have repeated (recurrent) headaches.  You feel dizzy.  You have swelling in your ankles.  You have trouble with your  vision. Get help right away if:  You develop a severe headache or confusion.  You have unusual weakness or numbness, or you feel faint.  You have severe pain in your chest or abdomen.  You vomit repeatedly.  You have trouble breathing. Summary  Hypertension is when the force of blood pumping through your arteries is too strong. If this condition is not controlled, it may put you at risk for serious complications.  Your personal target blood pressure may vary depending on your medical conditions, your age, and other factors. For most people, a normal blood pressure is less than 120/80.  Hypertension is managed by lifestyle changes, medicines, or both. Lifestyle changes include weight loss, eating a healthy, low-sodium diet, exercising more, and limiting alcohol. This information is not intended to replace advice given to you by your health care provider. Make sure you discuss any questions you have with your health care provider. Document Released: 02/16/2012 Document Revised: 04/21/2016 Document Reviewed: 04/21/2016 Elsevier Interactive Patient Education  [employer].

## 2018-04-19 ENCOUNTER — Encounter: Payer: Self-pay | Admitting: Internal Medicine

## 2018-04-19 ENCOUNTER — Other Ambulatory Visit: Payer: Self-pay | Admitting: Internal Medicine

## 2018-04-19 DIAGNOSIS — D508 Other iron deficiency anemias: Secondary | ICD-10-CM

## 2018-04-19 LAB — CMP14 + ANION GAP
ALBUMIN: 4.4 g/dL (ref 3.5–5.5)
ALT: 11 IU/L (ref 0–32)
AST: 13 IU/L (ref 0–40)
Albumin/Globulin Ratio: 1.4 (ref 1.2–2.2)
Alkaline Phosphatase: 44 IU/L (ref 39–117)
Anion Gap: 16 mmol/L (ref 10.0–18.0)
BUN / CREAT RATIO: 15 (ref 9–23)
BUN: 10 mg/dL (ref 6–20)
CHLORIDE: 103 mmol/L (ref 96–106)
CO2: 20 mmol/L (ref 20–29)
Calcium: 9.3 mg/dL (ref 8.7–10.2)
Creatinine, Ser: 0.68 mg/dL (ref 0.57–1.00)
GFR calc Af Amer: 132 mL/min/{1.73_m2} (ref >59)
GFR calc non Af Amer: 114 mL/min/{1.73_m2} (ref >59)
GLOBULIN, TOTAL: 3.1 g/dL (ref 1.5–4.5)
Glucose: 87 mg/dL (ref 65–99)
Potassium: 4.2 mmol/L (ref 3.5–5.2)
SODIUM: 139 mmol/L (ref 134–144)
Total Protein: 7.5 g/dL (ref 6.0–8.5)

## 2018-04-19 LAB — CBC
HEMATOCRIT: 33.5 % — AB (ref 34.0–46.6)
Hemoglobin: 10.7 g/dL — ABNORMAL LOW (ref 11.1–15.9)
MCH: 23.5 pg — AB (ref 26.6–33.0)
MCHC: 31.9 g/dL (ref 31.5–35.7)
MCV: 74 fL — AB (ref 79–97)
Platelets: 533 10*3/uL — ABNORMAL HIGH (ref 150–450)
RBC: 4.56 x10E6/uL (ref 3.77–5.28)
RDW: 16.2 % — AB (ref 12.3–15.4)
WBC: 7.5 10*3/uL (ref 3.4–10.8)

## 2018-04-19 LAB — TSH: TSH: 2.26 u[IU]/mL (ref 0.450–4.500)

## 2018-04-19 LAB — T3, FREE: T3, Free: 3.4 pg/mL (ref 2.0–4.4)

## 2018-04-19 LAB — LIPID PANEL
CHOLESTEROL TOTAL: 153 mg/dL (ref 100–199)
Chol/HDL Ratio: 3.5 ratio (ref 0.0–4.4)
HDL: 44 mg/dL (ref >39)
LDL CALC: 96 mg/dL (ref 0–99)
Triglycerides: 66 mg/dL (ref 0–149)
VLDL CHOLESTEROL CAL: 13 mg/dL (ref 5–40)

## 2018-04-19 LAB — T4, FREE: Free T4: 1.38 ng/dL (ref 0.82–1.77)

## 2018-04-19 NOTE — Progress Notes (Signed)
TIBA

## 2018-04-20 ENCOUNTER — Encounter: Payer: Self-pay | Admitting: Internal Medicine

## 2018-04-28 ENCOUNTER — Ambulatory Visit: Payer: BLUE CROSS/BLUE SHIELD | Attending: Internal Medicine | Admitting: Physical Therapy

## 2018-04-28 ENCOUNTER — Encounter: Payer: Self-pay | Admitting: Physical Therapy

## 2018-04-28 ENCOUNTER — Other Ambulatory Visit: Payer: Self-pay

## 2018-04-28 DIAGNOSIS — M25541 Pain in joints of right hand: Secondary | ICD-10-CM | POA: Insufficient documentation

## 2018-04-28 DIAGNOSIS — R293 Abnormal posture: Secondary | ICD-10-CM | POA: Diagnosis not present

## 2018-04-28 DIAGNOSIS — M79642 Pain in left hand: Secondary | ICD-10-CM | POA: Diagnosis not present

## 2018-04-28 DIAGNOSIS — G8929 Other chronic pain: Secondary | ICD-10-CM | POA: Diagnosis not present

## 2018-04-28 DIAGNOSIS — M25512 Pain in left shoulder: Secondary | ICD-10-CM | POA: Insufficient documentation

## 2018-04-28 DIAGNOSIS — M6281 Muscle weakness (generalized): Secondary | ICD-10-CM | POA: Insufficient documentation

## 2018-04-28 NOTE — Therapy (Addendum)
[place], [place], [address] Phone: [phone]   Fax:  [phone]  Physical Therapy Evaluation / Discharge Summary  Patient Details  Name: Mikayla Rios MRN: [id number] Date of Birth: [birth year] Referring Provider (PT): Marga Hoots- southward PA-C   Encounter Date: 04/28/2018  PT End of Session - 04/28/18 1305    Visit Number  1    Number of Visits  13    Date for PT Re-Evaluation  06/09/18    PT Start Time  1016    PT Stop Time  1100    PT Time Calculation (min)  44 min    Activity Tolerance  Patient tolerated treatment well       Past Medical History:  Diagnosis Date  . Allergy    seasonal  . Blood infection ([place]) 1985  . Blood transfusion without reported diagnosis     Past Surgical History:  Procedure Laterality Date  . NO PAST SURGERIES      There were no vitals filed for this visit.   Subjective Assessment - 04/28/18 1025    Subjective  pt is a 32 y.o F with CC of L shoulder and bil hand pain. L shoulder pain started a couple months ago with no specific onset of. she reports the pain started off and one has progressed to constant pain in the L shoulder. Reports she feels the pain travels from her L hand up to shoulder. bil hand pain started around 3-4 months ago, reports started as off and on now constanct with N/T in to the 1st - 3rd digtis. prior to onset no hx of shoulder or hand problems.     Limitations  Lifting;Writing    How long can you sit comfortably?  unlimited    How long can you stand comfortably?  unlimited    How long can you walk comfortably?  unlimited    Diagnostic tests  N/A    Patient Stated Goals  how to decrease pain, return to normal activity ADLS, and driving, return to work     Currently in Pain?  Yes    Pain Score  9    at worst 10/10   Pain Location  Shoulder    Pain Orientation  Left    Pain Descriptors / Indicators  Burning;Sharp;Aching    Pain  Type  Chronic pain    Pain Onset  More than a month ago    Pain Frequency  Constant    Aggravating Factors   any activity, typing    Pain Relieving Factors  medication, ice/ heat but no help    Effect of Pain on Daily Activities  limited lifting/ gripping     Multiple Pain Sites  Yes    Pain Score  8   at worst 10/10   Pain Location  Hand    Pain Orientation  Right;Left    Pain Descriptors / Indicators  Aching;Throbbing;Tingling;Pins and needles    Pain Type  Chronic pain    Pain Radiating Towards  into the 1st- 3rd digits bil     Pain Onset  More than a month ago    Pain Frequency  Constant    Aggravating Factors   typing, gripping, general use of bil hands    Pain Relieving Factors  uses bil cock-up splints    Effect of Pain on Daily Activities  limited gripping         [facility] PT Assessment - 04/28/18 1022  Assessment   Medical Diagnosis  L shoulder and bil hand pain    Referring Provider (PT)  Marga Hoots- southward PA-C    Onset Date/Surgical Date  --   bil wrist 3-4 months, L shoulder 2-3 months   Hand Dominance  Right    Next MD Visit  05/23/2018    Prior Therapy  no      Precautions   Precaution Comments  using bil braces for the hands      Balance Screen   Has the patient fallen in the past 6 months  No      [place] residence    Living Arrangements  Spouse/significant other    Available Help at Discharge  Family    Type of [place] Access  Level entry    [place]  Two level    Alternate Level Stairs-Number of Steps  16    Alternate Level Stairs-Rails  Right;Left   2 rails along first set, then R on the upper part of the [place]  --   bil wrist splints,      Prior Function   Level of Independence  Independent with basic ADLs    Vocation  Full time employment   Freight forwarder   Vocation Requirements  prolonged sitting/ typing,       Cognition   Overall Cognitive Status  Within  Functional Limits for tasks assessed      Observation/Other Assessments   Focus on Therapeutic Outcomes (FOTO)   64% limited   predicted 37 % limited     Posture/Postural Control   Posture/Postural Control  Postural limitations    Postural Limitations  Rounded Shoulders;Forward head      ROM / Strength   AROM / PROM / Strength  AROM;PROM;Strength      AROM   Overall AROM   Within functional limits for tasks performed    Overall AROM Comments  pain noted during all motions for the L shoulder. pain in all wrist motions    AROM Assessment Site  Shoulder      PROM   Overall PROM   Within functional limits for tasks performed      Strength   Strength Assessment Site  Shoulder;Hand;Wrist;Forearm    Right/Left Shoulder  Right;Left    Right Shoulder Flexion  4/5    Right Shoulder Extension  4/5    Right Shoulder ABduction  4/5    Right Shoulder Internal Rotation  4/5    Right Shoulder External Rotation  4/5    Left Shoulder Flexion  3+/5    Left Shoulder Extension  3+/5    Left Shoulder ABduction  3+/5    Left Shoulder Internal Rotation  3/5    Left Shoulder External Rotation  3/5    Right Forearm Pronation  4-/5    Right Forearm Supination  3+/5    Left Forearm Pronation  3+/5    Left Forearm Supination  3+/5    Right Wrist Flexion  3+/5    Right Wrist Extension  3+/5    Left Wrist Flexion  3+/5    Left Wrist Extension  3+/5    Right Hand Grip (lbs)  45.3   50,42,44   Left Hand Grip (lbs)  12   13,13,10     Palpation   Palpation comment  TTP along long head of the biceps tendon, and supraspinatus with spasm in the upper  trap, bil wrist TTP noted along carpal tunnel with pain along median nerve distribution.      Special Tests    Special Tests  Rotator Cuff Impingement    Other special tests  L wrist tinel (+), reverse phalens (+) only on L for tingle, phalens (+) bil for tingling    Rotator Cuff Impingment tests  Neer impingement test;Hawkins- Kennedy test;Empty Can  test;Full Can test;other;Painful Arc of Motion      Neer Impingement test    Findings  Not tested      Hawkins-Kennedy test   Findings  Positive    Side  Left      Empty Can test   Findings  Positive    Side  Left      Full Can test   Findings  Positive    Side  Left      Painful Arc of Motion   Findings  Positive    Side  Left      other   Findings  Positive    Side  Left    Comments  scapular assist                Objective measurements completed on examination: See above findings.      [place] Adult PT Treatment/Exercise - 04/28/18 1022      Self-Care   Self-Care  Other Self-Care Comments    Other Self-Care Comments   using a rolled up towel under the wrist with typing at work until she is able to get wrist support try discussing with her manager to get work to provide one      Corporate treasurer      Shoulder Exercises: Stretch   Other Shoulder Stretches  upper trap stretch 2 x 30 seconds      Wrist Exercises   Other wrist exercises  tendon glides 1 x 10 (series of motions) holding postion x 3 sec ea.              PT Education - 04/28/18 1304    Education Details  evaluation findings, POC, goals, HEP with proper form, proper positioning with typing and work related activity    Person(s) Educated  Patient    Methods  Explanation;Verbal cues;Handout    Comprehension  Verbalized understanding;Verbal cues required       PT Short Term Goals - 04/28/18 1314      PT SHORT TERM GOAL #1   Title  pt to be I with inital HEP    Time  3    Period  Weeks    Status  New    Target Date  05/19/18      PT SHORT TERM GOAL #2   Title  pt to verbalize / demonstrate proper posture and lifting mechanics as well as work related ergonomics to prevent and reduce wrist/ shoulder pain     Time  3    Period  Weeks    Status  New    Target Date  05/19/18      PT SHORT TERM GOAL #3   Title  increase L grip strength by >/= 8# to promote  improvement in wrist and L shoulder function    Time  3    Period  Weeks    Status  New    Target Date  05/19/18      PT SHORT TERM GOAL #4   Title  -    Time  --  Period  --    Status  --    Target Date  --        PT Long Term Goals - 04/28/18 1315      PT LONG TERM GOAL #1   Title  increase l shoulder strength to >/= 4+/5 in all planes to promtoe shoulder stability and rhythm     Time  6    Period  Weeks    Status  New    Target Date  06/09/18      PT LONG TERM GOAL #2   Title  increase bil wrist/ forearm strength to >/= 4+/5 with </= 2/10 pain during testing     Time  6    Status  New    Target Date  06/09/18      PT LONG TERM GOAL #3   Title  increase FOTO score to </= 33% limited to demo improvement in function     Time  6    Period  Weeks    Status  New    Target Date  06/09/18      PT LONG TERM GOAL #4   Title  pt to report </=2/10 pain in the wrist / shoulder and no N/T in the hand for >/= 1-2 weeks     Time  6    Period  Weeks    Status  New    Target Date  06/09/18      PT LONG TERM GOAL #5   Title  pt to be I with all HEP given as of last visit to maintain and progress current level of function    Time  6    Period  Weeks    Status  New    Target Date  06/09/18             Plan - 04/28/18 1306    Clinical Impression Statement  pt is a pleasant 32 y.o F presenting to [facility] with CC of bil wrist/ hand pain and L shoulder pain, both starting with insidious onset. bil wrist/ shoulder ROM is WFL with pain noted in all planes. L shoulder TTP along long head of the biceps tendon, and supraspinatus with spasm in the upper trap, bil wrist TTP noted along carpal tunnel with pain along median nerve distribution.  special testing is positive indicating a  high likelihood of L shoulder impingment in combination with bil carpal tunnel. she would benefit from physical therapy to decrease l shoulder and bil wrist/ hand pain, promote proper posture and  ergonomics, and improve strength by addressing the deficits listed.     History and Personal Factors relevant to plan of care:  obesity    Clinical Presentation  Evolving    Clinical Presentation due to:  L shoulder pain, bil wrist/ hand pain both worsening. general weakness    Clinical Decision Making  Moderate    Rehab Potential  Good    PT Frequency  2x / week    PT Duration  6 weeks    PT Treatment/Interventions  ADLs/Self Care Home Management;Cryotherapy;Electrical Stimulation;Iontophoresis 40m/ml Dexamethasone;Moist Heat;Ultrasound;Neuromuscular re-education;Therapeutic activities;Therapeutic exercise;Manual techniques;Taping;Dry needling;Patient/family education    PT Next Visit Plan  review/ update HEP, Wrist: tendon glides, gentle stretching of flexors, STW, proper ergonomics,  shoulder, peri-scapular strengthening, upper trap stretching, modalities for pain     PT Home Exercise Plan  tendon glides, upper trap stretch, scapular retraction, ceiling punches    Consulted and Agree with Plan of Care  Patient  Patient will benefit from skilled therapeutic intervention in order to improve the following deficits and impairments:  Obesity, Pain, Increased fascial restricitons, Impaired UE functional use, Decreased endurance, Decreased activity tolerance, Postural dysfunction, Improper body mechanics, Decreased range of motion, Increased muscle spasms  Visit Diagnosis: Pain in joints of right hand  Pain in left hand  Chronic left shoulder pain  Abnormal posture  Muscle weakness (generalized)     Problem List Patient Active Problem List   Diagnosis Date Noted  . Obesity   . SVD (spontaneous vaginal delivery) 12/16/2012   Starr [place] PT, DPT, LAT, ATC  04/28/18  1:23 PM      [facility] [address] [place], [place], [address] Phone: [phone]   Fax:  [phone]  Name: Mikayla Rios MRN: [id number] Date  of Birth: 1985-11-13     PHYSICAL THERAPY DISCHARGE SUMMARY  Visits from Start of Care: 1  Current functional level related to goals / functional outcomes: See goals   Remaining deficits: Unknown    Education / Equipment: HEP  Plan:                                                    Patient goals were not met. Patient is being discharged due to not returning since the last visit.  ?????         Shawnn Bouillon PT, DPT, LAT, ATC  07/06/18  11:44 AM

## 2018-05-09 ENCOUNTER — Ambulatory Visit: Payer: BLUE CROSS/BLUE SHIELD | Admitting: Physical Therapy

## 2018-05-09 ENCOUNTER — Encounter: Payer: Self-pay | Admitting: Internal Medicine

## 2018-05-10 ENCOUNTER — Encounter (HOSPITAL_COMMUNITY): Payer: Self-pay | Admitting: *Deleted

## 2018-05-10 ENCOUNTER — Other Ambulatory Visit: Payer: Self-pay

## 2018-05-10 ENCOUNTER — Emergency Department (HOSPITAL_COMMUNITY)
Admission: EM | Admit: 2018-05-10 | Discharge: 2018-05-10 | Disposition: A | Discharge status: Home / Self Care | Payer: Worker's Compensation | Attending: Emergency Medicine | Admitting: Emergency Medicine

## 2018-05-10 ENCOUNTER — Ambulatory Visit: Payer: BLUE CROSS/BLUE SHIELD

## 2018-05-10 DIAGNOSIS — M25532 Pain in left wrist: Secondary | ICD-10-CM | POA: Diagnosis present

## 2018-05-10 DIAGNOSIS — G5603 Carpal tunnel syndrome, bilateral upper limbs: Secondary | ICD-10-CM | POA: Insufficient documentation

## 2018-05-10 MED ORDER — GABAPENTIN 300 MG PO CAPS: 300.0000 mg | ORAL_CAPSULE | Freq: Three times a day (TID) | ORAL | 1 refills | Status: DC

## 2018-05-10 MED ORDER — GABAPENTIN 600 MG PO TABS
300.0000 mg | ORAL_TABLET | Freq: Once | ORAL | Status: AC
  Administered 2018-05-10: 300 mg via ORAL
  Filled 2018-05-10: qty 1

## 2018-05-10 NOTE — ED Triage Notes (Signed)
PT reports  Having Bil wrist pain from carpel tunnel . Pt is under workers comp coverage. Pt reports increased pain and wants MRI. Of wrist . Pt also voices concern of safty while driving because hands are numb.

## 2018-05-10 NOTE — Discharge Instructions (Addendum)
You were evaluated in the Emergency Department and after careful evaluation, we did not find any emergent condition requiring admission or further testing in the hospital.  Your symptoms today seem to be due to carpal tunnel syndrome.  We discussed her case with our case managers, will unfortunately have nothing more to add.  Your physical therapy will need to be approved by your employer.  We encourage you to continue trying.  In the meantime, we have provided you with a prescription for gabapentin which may help your pain.  You could also consider following up with the specialist provided.  Please return to the Emergency Department if you experience any worsening of your condition.  We encourage you to follow up with a primary care provider.  Thank you for allowing [place] to be a part of your care.

## 2018-05-10 NOTE — ED Notes (Signed)
Pt verbalizes understanding of d/c instructions. Prescriptions reviewed with patient. Pt ambulatory at d/c with all belongings and with family.   

## 2018-05-10 NOTE — ED Provider Notes (Signed)
[facility] Emergency Department Provider Note MRN:  [id number]  Arrival date & time: 05/10/18     Chief Complaint   Wrist Pain   History of Present Illness   Mikayla Rios is a 32 y.o. year-old female with a history of carpal tunnel syndrome presenting to the ED with chief complaint of wrist pain.  The pain is located in the bilateral wrists, left greater than right.  Patient has been diagnosed with carpal tunnel syndrome.  She is here today because she is having trouble getting Worker's Compensation to approve of her physical therapy.  She supposed to be taking physical therapy twice a week.  She explains that the pain is getting worse, she continues to have worsening paresthesias to both wrists and hands.  More recently having trouble with the symptoms while driving.  Denies any neck pain or neck trauma, no trauma to the arms, no bowel or bladder dysfunction, no numbness or weakness.  Review of Systems  A complete 10 system review of systems was obtained and all systems are negative except as noted in the HPI and PMH.   Patient's Health History    Past Medical History:  Diagnosis Date  . Allergy    seasonal  . Blood infection ([place]) 1985  . Blood transfusion without reported diagnosis     Past Surgical History:  Procedure Laterality Date  . NO PAST SURGERIES      Family History  Problem Relation Age of Onset  . Cancer Mother        breast, and in remission  . Diabetes Mother   . Hypertension Mother   . Autism Son   . Heart disease Paternal Aunt   . Cancer Paternal Uncle        colon  . Diabetes Maternal Grandmother   . Hypertension Maternal Grandmother   . Diabetes Maternal Grandfather   . Kidney disease Maternal Grandfather   . Diabetes Paternal Grandmother   . Cancer Paternal Grandfather        colon  . Congestive Heart Failure Father     Social History   Socioeconomic History  . Marital status: Married    Spouse name: Not on file  .  Number of children: 3  . Years of education: Not on file  . Highest education level: Not on file  Occupational History  . Not on file  Social Needs  . Financial resource strain: Not on file  . Food insecurity:    Worry: Not on file    Inability: Not on file  . Transportation needs:    Medical: Not on file    Non-medical: Not on file  Tobacco Use  . Smoking status: Never Smoker  . Smokeless tobacco: Never Used  Substance and Sexual Activity  . Alcohol use: Yes    Comment: occ  . Drug use: No  . Sexual activity: Yes    Birth control/protection: None  Lifestyle  . Physical activity:    Days per week: Not on file    Minutes per session: Not on file  . Stress: Not on file  Relationships  . Social connections:    Talks on phone: Not on file    Gets together: Not on file    Attends religious service: Not on file    Active member of club or organization: Not on file    Attends meetings of clubs or organizations: Not on file    Relationship status: Not on file  . Intimate partner  violence:    Fear of current or ex partner: Not on file    Emotionally abused: Not on file    Physically abused: Not on file    Forced sexual activity: Not on file  Other Topics Concern  . Not on file  Social History Narrative  . Not on file     Physical Exam  Vital Signs and Nursing Notes reviewed Vitals:   05/10/18 1827  BP: (!) 158/112  Pulse: 95  Resp: 18  Temp: 98.6 F (37 C)  SpO2: 100%    CONSTITUTIONAL: Well-appearing, NAD NEURO:  Alert and oriented x 3, no focal deficits EYES:  eyes equal and reactive ENT/NECK:  no LAD, no JVD CARDIO: Regular rate, well-perfused, normal S1 and S2 PULM:  CTAB no wheezing or rhonchi GI/GU:  normal bowel sounds, non-distended, non-tender MSK/SPINE:  No gross deformities, no edema bilateral positive Tinel sign SKIN:  no rash, atraumatic PSYCH:  Appropriate speech and behavior  Diagnostic and Interventional Summary    EKG  Interpretation  Date/Time:    Ventricular Rate:    PR Interval:    QRS Duration:   QT Interval:    QTC Calculation:   R Axis:     Text Interpretation:        Labs Reviewed - No data to display  No orders to display    Medications  gabapentin (NEURONTIN) tablet 300 mg (has no administration in time range)     Procedures Critical Care  ED Course and Medical Decision Making  I have reviewed the triage vital signs and the nursing notes.  Pertinent labs & imaging results that were available during my care of the patient were reviewed by me and considered in my medical decision making (see below for details).  Nonemergent ED visit for chronic carpal tunnel in this 32 year old female.  Nothing to suggest need for emergent imaging, does not appear to be related to a cervical pathology.  Discussed with case management, unfortunately patient will just have to continue working with her own case manager through her employer to help facilitate physical therapy.  We will provided with prescription for gabapentin, refer her to a hand specialist for possible injection.  After the discussed management above, the patient was determined to be safe for discharge.  The patient was in agreement with this plan and all questions regarding their care were answered.  ED return precautions were discussed and the patient will return to the ED with any significant worsening of condition.  Barth Kirks. Sedonia Small, MD [place] [email or web address]  Final Clinical Impressions(s) / ED Diagnoses     ICD-10-CM   1. Bilateral carpal tunnel syndrome G56.03     ED Discharge Orders         Ordered    gabapentin (NEURONTIN) 300 MG capsule  3 times daily     05/10/18 1903             Maudie Flakes, MD 05/10/18 1904

## 2018-05-11 ENCOUNTER — Other Ambulatory Visit: Payer: Self-pay | Admitting: Internal Medicine

## 2018-05-11 LAB — IRON AND TIBC
IRON SATURATION: 8 % — AB (ref 15–55)
IRON: 30 ug/dL (ref 27–159)
TIBC: 398 ug/dL (ref 250–450)
UIBC: 368 ug/dL (ref 131–425)

## 2018-05-11 LAB — SPECIMEN STATUS REPORT

## 2018-05-11 LAB — FERRITIN: Ferritin: 8 ng/mL — ABNORMAL LOW (ref 15–150)

## 2018-05-11 MED ORDER — LISINOPRIL 5 MG PO TABS: 5.0000 mg | ORAL_TABLET | Freq: Every day | ORAL | 0 refills | Status: DC

## 2018-05-11 NOTE — Progress Notes (Signed)
Higher dose Lisinopril sent

## 2018-05-12 ENCOUNTER — Encounter

## 2018-05-16 ENCOUNTER — Ambulatory Visit: Payer: BLUE CROSS/BLUE SHIELD | Admitting: Physical Therapy

## 2018-05-18 ENCOUNTER — Encounter: Payer: PRIVATE HEALTH INSURANCE | Admitting: Physical Therapy

## 2018-05-23 ENCOUNTER — Ambulatory Visit: Payer: BLUE CROSS/BLUE SHIELD | Admitting: Physical Therapy

## 2018-05-23 ENCOUNTER — Encounter: Payer: Self-pay | Admitting: Internal Medicine

## 2018-05-23 ENCOUNTER — Ambulatory Visit: Payer: BLUE CROSS/BLUE SHIELD | Admitting: Internal Medicine

## 2018-05-23 VITALS — BP 140/80 | HR 104 | Temp 98.2°F | Ht 63.0 in | Wt 333.6 lb

## 2018-05-23 DIAGNOSIS — I10 Essential (primary) hypertension: Secondary | ICD-10-CM

## 2018-05-23 DIAGNOSIS — R0683 Snoring: Secondary | ICD-10-CM

## 2018-05-23 DIAGNOSIS — D508 Other iron deficiency anemias: Secondary | ICD-10-CM

## 2018-05-23 DIAGNOSIS — Z6841 Body Mass Index (BMI) 40.0 and over, adult: Secondary | ICD-10-CM

## 2018-05-23 DIAGNOSIS — H9201 Otalgia, right ear: Secondary | ICD-10-CM

## 2018-05-23 MED ORDER — LISINOPRIL-HYDROCHLOROTHIAZIDE 10-12.5 MG PO TABS: 1.0000 | ORAL_TABLET | Freq: Every day | ORAL | 1 refills | Status: DC

## 2018-05-23 NOTE — Progress Notes (Addendum)
Subjective:     Patient ID: Mikayla Rios , female    DOB: [birth year] , 32 y.o.   MRN: [id number]   Chief Complaint  Patient presents with  . Ear Problem    ear pain for 4 days on R side, now feels some discomfort on L   . Follow-up    BP Check     HPI 1-Pt is here for FU HTN, obesity. Her BP's have been 150's even after increasing her lisinopril. She called [place] after she got home and her BP's readings have been 165-173/97-133.  2- Went to ER and was prescribed Gabapentin TID due to shoulder pain and bilateral carpal tunnel flair, but causes her sedation. Tylenol 1000 mg has not helped. Now she is only taking the Gabapentin qhs prn.[facility]. But could not continue it since it was job related and now is waiting on [place]'s [employer].  And has not be seen. Went to ER and was prescribed Gabapentin TID due to shoulder pain, but causes her sedation. Tylenol 1000 mg has not helped. Now she is only taking the Gabapentin qhs prn.    3- Has been taking the Fe, and is tolerating fine. Takes it bid.  Past Medical History:  Diagnosis Date  . Allergy    seasonal  . Blood infection ([place]) 1985  . Blood transfusion without reported diagnosis      Family History  Problem Relation Age of Onset  . Cancer Mother        breast, and in remission  . Diabetes Mother   . Hypertension Mother   . Autism Son   . Heart disease Paternal Aunt   . Cancer Paternal Uncle        colon  . Diabetes Maternal Grandmother   . Hypertension Maternal Grandmother   . Diabetes Maternal Grandfather   . Kidney disease Maternal Grandfather   . Diabetes Paternal Grandmother   . Cancer Paternal Grandfather        colon  . Congestive Heart Failure Father      Current Outpatient Medications:  .  ferrous sulfate 325 (65 FE) MG tablet, Take 325 mg by mouth 2 (two) times daily., Disp: , Rfl:  .  gabapentin (NEURONTIN) 300 MG capsule, Take 1 capsule (300 mg total) by mouth 3 (three) times daily.,  Disp: 30 capsule, Rfl: 1 .  lisinopril (PRINIVIL,ZESTRIL) 5 MG tablet, Take 1 tablet (5 mg total) by mouth daily., Disp: 30 tablet, Rfl: 0   Allergies  Allergen Reactions  . Shellfish Allergy Hives  . Penicillins Hives and Rash    Has patient had a PCN reaction causing immediate rash, facial/tongue/throat swelling, SOB or lightheadedness with hypotension: No Has patient had a PCN reaction causing severe rash involving mucus membranes or skin necrosis: hives Has patient had a PCN reaction that required hospitalization No Has patient had a PCN reaction occurring within the last 10 years: yes If all of the above answers are "NO", then may proceed with Cephalosporin use.      Review of Systems  Constitutional: Negative for fatigue.  HENT: Positive for ear pain.        Last week had R ear pain and felt stuffy. Last night developed L ear pain. Has had post nasal dranaige since last week.   Eyes: Negative for visual disturbance.  Respiratory: Negative for shortness of breath.   Cardiovascular: Negative for chest pain, palpitations and leg swelling.  Gastrointestinal: Negative for constipation.  Musculoskeletal: Positive for arthralgias.       Shoulder from job injury   Neurological: Positive for dizziness. Negative for speech difficulty, weakness and headaches.       Had vertigo last week.      Today's Vitals   05/23/18 1113  BP: 140/80  Pulse: (!) 104  Temp: 98.2 F (36.8 C)  TempSrc: Oral  SpO2: 98%  Weight: (!) 333 lb 9.6 oz (151.3 kg)  Height: 5\' 3"  (1.6 m)   Body mass index is 59.09 kg/m.   Objective:  Physical Exam HENT:     Nose: Nose normal.  Eyes:     Pupils: Pupils are equal, round, and reactive to light.     Constitutional: She is oriented to person, place, and time. She appears well-developed and well-nourished. No distress.  HENT:  Head: Normocephalic and atraumatic.  Right Ear: External ear normal.  Left Ear: External ear normal.  Nose: Nose normal.   Eyes: Conjunctivae are normal. Right eye exhibits no discharge. Left eye exhibits no discharge. No scleral icterus.  Neck: Neck supple. No thyromegaly present.  No carotid bruits bilaterally  Cardiovascular: Normal rate and regular rhythm.  No murmur heard. Pulmonary/Chest: Effort normal and breath sounds normal. No respiratory distress.  Musculoskeletal: Normal range of motion. She exhibits no edema.  Lymphadenopathy:    She has no cervical adenopathy.  Neurological: She is alert and oriented to person, place, and time.  Skin: Skin is warm and dry. Capillary refill takes less than 2 seconds. No rash noted. She is not diaphoretic.  Psychiatric: She has a normal mood and affect. Her behavior is normal. Judgment and thought content normal.  Nursing note reviewed.  Weight is down 5 lbs.  Assessment And Plan:     1. Essential hypertension- not controlled. I placed her on Lisinopril- HCTZ 10/12.5 mg qd. - Ambulatory referral to Sleep Studies - Amb ref to Medical Nutrition Therapy-MNT  2. Morbid obesity with BMI of 50.0-59.9, adult ([place])- chronic - Ambulatory referral to Sleep Studies - Amb ref to Medical Nutrition Therapy-MNT- [facility]  3. Snores- chronic - Ambulatory referral to Sleep Studies  4. Other iron deficiency anemia- acute - CBC no Diff - Ferritin - Iron and TIBC([facility]) 5- Otalgia- no disease found, may be eustachian tube dysfunction. No action.   FU in 2 months.   [place] Riesen [name], PA-C

## 2018-05-24 ENCOUNTER — Other Ambulatory Visit: Payer: Self-pay | Admitting: Internal Medicine

## 2018-05-24 DIAGNOSIS — I10 Essential (primary) hypertension: Secondary | ICD-10-CM

## 2018-05-24 LAB — CBC
HEMATOCRIT: 35.9 % (ref 34.0–46.6)
Hemoglobin: 11 g/dL — ABNORMAL LOW (ref 11.1–15.9)
MCH: 23.8 pg — AB (ref 26.6–33.0)
MCHC: 30.6 g/dL — AB (ref 31.5–35.7)
MCV: 78 fL — AB (ref 79–97)
PLATELETS: 483 10*3/uL — AB (ref 150–450)
RBC: 4.62 x10E6/uL (ref 3.77–5.28)
RDW: 18 % — AB (ref 12.3–15.4)
WBC: 6.2 10*3/uL (ref 3.4–10.8)

## 2018-05-24 LAB — IRON AND TIBC
Iron Saturation: 10 % — ABNORMAL LOW (ref 15–55)
Iron: 35 ug/dL (ref 27–159)
Total Iron Binding Capacity: 352 ug/dL (ref 250–450)
UIBC: 317 ug/dL (ref 131–425)

## 2018-05-24 LAB — FERRITIN: Ferritin: 15 ng/mL (ref 15–150)

## 2018-05-25 ENCOUNTER — Ambulatory Visit: Payer: BLUE CROSS/BLUE SHIELD | Admitting: Physical Therapy

## 2018-05-29 ENCOUNTER — Ambulatory Visit: Payer: BLUE CROSS/BLUE SHIELD | Admitting: Physical Therapy

## 2018-06-02 ENCOUNTER — Other Ambulatory Visit: Payer: Self-pay | Admitting: Internal Medicine

## 2018-06-05 ENCOUNTER — Ambulatory Visit: Payer: BLUE CROSS/BLUE SHIELD | Admitting: Physical Therapy

## 2018-06-07 ENCOUNTER — Other Ambulatory Visit: Payer: Self-pay | Admitting: Internal Medicine

## 2018-06-07 DIAGNOSIS — I10 Essential (primary) hypertension: Secondary | ICD-10-CM

## 2018-06-14 ENCOUNTER — Other Ambulatory Visit: Payer: Self-pay | Admitting: Internal Medicine

## 2018-06-24 ENCOUNTER — Other Ambulatory Visit: Payer: Self-pay | Admitting: Internal Medicine

## 2018-07-05 ENCOUNTER — Encounter: Payer: Self-pay | Admitting: Internal Medicine

## 2018-07-05 ENCOUNTER — Ambulatory Visit: Payer: Self-pay | Admitting: Dietician

## 2018-07-11 ENCOUNTER — Other Ambulatory Visit: Payer: Self-pay

## 2018-07-11 ENCOUNTER — Ambulatory Visit: Payer: BLUE CROSS/BLUE SHIELD | Admitting: Internal Medicine

## 2018-07-11 ENCOUNTER — Encounter: Payer: Self-pay | Admitting: Internal Medicine

## 2018-07-11 VITALS — BP 148/88 | HR 110 | Temp 98.8°F | Ht 62.4 in | Wt 330.2 lb

## 2018-07-11 DIAGNOSIS — I10 Essential (primary) hypertension: Secondary | ICD-10-CM

## 2018-07-11 DIAGNOSIS — G5603 Carpal tunnel syndrome, bilateral upper limbs: Secondary | ICD-10-CM | POA: Diagnosis not present

## 2018-07-11 MED ORDER — LISINOPRIL 5 MG PO TABS: 5.0000 mg | ORAL_TABLET | Freq: Every day | ORAL | 0 refills | Status: DC

## 2018-07-11 NOTE — Progress Notes (Signed)
Subjective:     Patient ID: Mikayla Rios , female    DOB: 11/06/1985 , 33 y.o.   MRN: [id number]   Chief Complaint  Patient presents with  . Hand Pain    HPI  Pt presents with bilateral hand numbness and tingling with pain radiating to her L  Elbow and shoulder x September. 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 There has not been any changes in her key board or computer.  Numbness provoked with driving and during sleep. In the past 11/2 weeks she has been dropping things with both hands, but worse with the L. The worker's man case is still pending, but she still has not been called for FU and treatment. She has been wearing her wrist splints, but is not helping her. When she saw the PT provider he confirmed she has carpal tunnel in both hands.   Past Medical History:  Diagnosis Date  . Allergy    seasonal  . Blood infection ([place]) 1985  . Blood transfusion without reported diagnosis      Family History  Problem Relation Age of Onset  . Cancer Mother        breast, and in remission  . Diabetes Mother   . Hypertension Mother   . Autism Son   . Heart disease Paternal Aunt   . Cancer Paternal Uncle        colon  . Diabetes Maternal Grandmother   . Hypertension Maternal Grandmother   . Diabetes Maternal Grandfather   . Kidney disease Maternal Grandfather   . Diabetes Paternal Grandmother   . Cancer Paternal Grandfather        colon  . Congestive Heart Failure Father      Current Outpatient Medications:  .  ferrous sulfate 325 (65 FE) MG tablet, Take 325 mg by mouth 2 (two) times daily., Disp: , Rfl:  .  lisinopril-hydrochlorothiazide (PRINZIDE,ZESTORETIC) 10-12.5 MG tablet, TAKE 1 TABLET BY MOUTH EVERY DAY, Disp: 30 tablet, Rfl: 1 .  gabapentin (NEURONTIN) 300 MG capsule, Take 1 capsule (300 mg total) by mouth 3 (three) times daily. (Patient not taking: Reported on 07/11/2018), Disp: 30 capsule, Rfl: 1    Allergies  Allergen Reactions  . Shellfish Allergy Hives  . Penicillins Hives and Rash    Has patient had a PCN reaction causing immediate rash, facial/tongue/throat swelling, SOB or lightheadedness with hypotension: No Has patient had a PCN reaction causing severe rash involving mucus membranes or skin necrosis: hives Has patient had a PCN reaction that required hospitalization No Has patient had a PCN reaction occurring within the last 10 years: yes If all of the above answers are "NO", then may proceed with Cephalosporin use.      Review of Systems   Today's Vitals   07/11/18 1121  BP: (!) 148/88  Pulse: (!) 110  Temp: 98.8 F (37.1 C)  TempSrc: Oral  SpO2: 98%  Weight: (!) 330 lb 3.2 oz (149.8 kg)  Height: 5' 2.4" (1.585 m)  PainSc: 8   PainLoc: Wrist   Body mass index is 59.62 kg/m.   Objective:  Physical Exam Vitals signs and nursing note reviewed.  Constitutional:      General: She is not in acute distress.    Appearance: Normal appearance. She is not toxic-appearing.  HENT:     Right Ear: External ear normal.     Left Ear: External  ear normal.     Nose: Nose normal.  Eyes:     General: No scleral icterus.    Conjunctiva/sclera: Conjunctivae normal.  Neck:     Musculoskeletal: Neck supple. No neck rigidity or muscular tenderness.  Pulmonary:     Effort: Pulmonary effort is normal.  Musculoskeletal:        General: Swelling and tenderness present. No deformity or signs of injury.     Right lower leg: No edema.     Left lower leg: No edema.     Comments: HANDS- L slightly swollen compared to the R. I cant tell her L arm is swollen compared to the R, they look symmetric. ROM of fingers and wrist are normal bilaterally.   Lymphadenopathy:     Cervical: No cervical adenopathy.  Skin:    General: Skin is warm and dry.     Capillary Refill: Capillary refill takes less than 2 seconds.     Coloration: Skin is not pale.     Findings: No bruising, erythema or  rash.  Neurological:     Mental Status: She is alert and oriented to person, place, and time.     Motor: Weakness present.     Gait: Gait normal.     Deep Tendon Reflexes: Reflexes normal.     Comments: L hand grip strength 2/5, R 4/5. + Phalen's bilaterally, neg Tinel's   Psychiatric:        Mood and Affect: Mood normal.        Behavior: Behavior normal.        Thought Content: Thought content normal.        Judgment: Judgment normal.     Assessment And Plan:    1. Bilateral carpal tunnel syndrome- chronic. Still pending for workman's comp provider visit. I filled out for FMLA.    Vercie Pokorny [name], PA-C

## 2018-07-13 ENCOUNTER — Institutional Professional Consult (permissible substitution): Payer: BLUE CROSS/BLUE SHIELD | Admitting: Neurology

## 2018-07-16 ENCOUNTER — Other Ambulatory Visit: Payer: Self-pay | Admitting: Internal Medicine

## 2018-07-18 ENCOUNTER — Ambulatory Visit: Payer: BLUE CROSS/BLUE SHIELD | Admitting: Audiology

## 2018-07-20 ENCOUNTER — Ambulatory Visit: Payer: Self-pay | Admitting: Dietician

## 2018-07-25 ENCOUNTER — Encounter: Payer: Self-pay | Admitting: Internal Medicine

## 2018-07-25 ENCOUNTER — Ambulatory Visit: Payer: BLUE CROSS/BLUE SHIELD | Admitting: Internal Medicine

## 2018-07-25 VITALS — BP 140/82 | HR 101 | Temp 98.6°F | Ht 62.6 in | Wt 329.0 lb

## 2018-07-25 DIAGNOSIS — D508 Other iron deficiency anemias: Secondary | ICD-10-CM | POA: Diagnosis not present

## 2018-07-25 DIAGNOSIS — I10 Essential (primary) hypertension: Secondary | ICD-10-CM | POA: Diagnosis not present

## 2018-07-25 MED ORDER — LISINOPRIL-HYDROCHLOROTHIAZIDE 20-12.5 MG PO TABS: 1.0000 | ORAL_TABLET | Freq: Every day | ORAL | 0 refills | Status: DC

## 2018-07-25 NOTE — Progress Notes (Signed)
Subjective:     Patient ID: Mikayla Rios , female    DOB: [birth year] , 33 y.o.   MRN: [id number]   Chief Complaint  Patient presents with  . Hypertension  . Anemia    HPI  1- HTN FU, BP's at home have been 147-164/80-97.   2- anemia FU, been taking Iron bid.   Started getting a cold today. On antibiotics for dental infection.   Past Medical History:  Diagnosis Date  . Allergy    seasonal  . Blood infection ([place]) 1985  . Blood transfusion without reported diagnosis      Family History  Problem Relation Age of Onset  . Cancer Mother        breast, and in remission  . Diabetes Mother   . Hypertension Mother   . Autism Son   . Heart disease Paternal Aunt   . Cancer Paternal Uncle        colon  . Diabetes Maternal Grandmother   . Hypertension Maternal Grandmother   . Diabetes Maternal Grandfather   . Kidney disease Maternal Grandfather   . Diabetes Paternal Grandmother   . Cancer Paternal Grandfather        colon  . Congestive Heart Failure Father      Current Outpatient Medications:  .  clindamycin (CLEOCIN) 150 MG capsule, Take by mouth every 6 (six) hours., Disp: , Rfl:  .  ferrous sulfate 325 (65 FE) MG tablet, Take 325 mg by mouth 2 (two) times daily., Disp: , Rfl:  .  lisinopril-hydrochlorothiazide (PRINZIDE,ZESTORETIC) 10-12.5 MG tablet, TAKE 1 TABLET BY MOUTH EVERY DAY, Disp: 30 tablet, Rfl: 1   Allergies  Allergen Reactions  . Shellfish Allergy Hives  . Penicillins Hives and Rash    Has patient had a PCN reaction causing immediate rash, facial/tongue/throat swelling, SOB or lightheadedness with hypotension: No Has patient had a PCN reaction causing severe rash involving mucus membranes or skin necrosis: hives Has patient had a PCN reaction that required hospitalization No Has patient had a PCN reaction occurring within the last 10 years: yes If all of the above answers are "NO", then may proceed with Cephalosporin use.      Review of Systems   Constitutional: Negative for diaphoresis.  Eyes: Negative for visual disturbance.  Respiratory: Negative for cough, chest tightness and shortness of breath.   Cardiovascular: Negative for chest pain, palpitations and leg swelling.  Gastrointestinal: Negative for abdominal distention.  Genitourinary: Negative for difficulty urinating.  Skin: Negative for rash.  Neurological: Positive for numbness. Negative for dizziness, light-headedness and headaches.       Numbness on hands due to carpal tunnel which is work related     [place]   07/25/18 1112  BP: 140/82  Pulse: (!) 101  Temp: 98.6 F (37 C)  TempSrc: Oral  Weight: (!) 329 lb (149.2 kg)  Height: 5' 2.6" (1.59 m)  PainSc: 6   PainLoc: Mouth   Body mass index is 59.03 kg/m.   Objective:  Physical Exam   Constitutional: She is oriented to person, place, and time. She appears well-developed and well-nourished. No distress.  HENT:  Head: Normocephalic and atraumatic.  Right Ear: External ear normal.  Left Ear: External ear normal.  Nose: Nose normal.  Eyes: Conjunctivae are normal. Right eye exhibits no discharge. Left eye exhibits no discharge. No scleral icterus.  Neck: Neck supple. No thyromegaly present.  No carotid bruits bilaterally  Cardiovascular: Normal rate and regular rhythm.  No murmur  heard. Pulmonary/Chest: Effort normal and breath sounds normal. No respiratory distress.  Musculoskeletal: Normal range of motion. She exhibits no edema.  Lymphadenopathy:    She has no cervical adenopathy.  Neurological: She is alert and oriented to person, place, and time.  Skin: Skin is warm and dry. Capillary refill takes less than 2 seconds. No rash noted. She is not diaphoretic.  Psychiatric: She has a normal mood and affect. Her behavior is normal. Judgment and thought content normal.  Nursing note reviewed.     Assessment And Plan:     1. Essential hypertension- not controlled. I ncreased her medication to  20/12.5 mg.  - CBC no Diff - CMP14 + Anion Gap  2. Other iron deficiency anemia- chronic - CBC no Diff - Iron and TIBC([facility])   Stirling Orton [name], PA-C

## 2018-07-26 LAB — CBC
HEMATOCRIT: 36.6 % (ref 34.0–46.6)
HEMOGLOBIN: 11.5 g/dL (ref 11.1–15.9)
MCH: 25.5 pg — ABNORMAL LOW (ref 26.6–33.0)
MCHC: 31.4 g/dL — ABNORMAL LOW (ref 31.5–35.7)
MCV: 81 fL (ref 79–97)
Platelets: 503 10*3/uL — ABNORMAL HIGH (ref 150–450)
RBC: 4.51 x10E6/uL (ref 3.77–5.28)
RDW: 17 % — AB (ref 11.7–15.4)
WBC: 6.4 10*3/uL (ref 3.4–10.8)

## 2018-07-26 LAB — CMP14 + ANION GAP
ALBUMIN: 4.3 g/dL (ref 3.8–4.8)
ALT: 13 IU/L (ref 0–32)
ANION GAP: 15 mmol/L (ref 10.0–18.0)
AST: 16 IU/L (ref 0–40)
Albumin/Globulin Ratio: 1.4 (ref 1.2–2.2)
Alkaline Phosphatase: 37 IU/L — ABNORMAL LOW (ref 39–117)
BUN / CREAT RATIO: 17 (ref 9–23)
BUN: 12 mg/dL (ref 6–20)
Bilirubin Total: 0.2 mg/dL (ref 0.0–1.2)
CALCIUM: 9.6 mg/dL (ref 8.7–10.2)
CO2: 25 mmol/L (ref 20–29)
CREATININE: 0.7 mg/dL (ref 0.57–1.00)
Chloride: 103 mmol/L (ref 96–106)
GFR calc Af Amer: 131 mL/min/{1.73_m2} (ref >59)
GFR, EST NON AFRICAN AMERICAN: 113 mL/min/{1.73_m2} (ref >59)
GLOBULIN, TOTAL: 3 g/dL (ref 1.5–4.5)
Glucose: 85 mg/dL (ref 65–99)
Potassium: 4.1 mmol/L (ref 3.5–5.2)
Sodium: 143 mmol/L (ref 134–144)
TOTAL PROTEIN: 7.3 g/dL (ref 6.0–8.5)

## 2018-07-26 LAB — IRON AND TIBC
IRON SATURATION: 21 % (ref 15–55)
Iron: 74 ug/dL (ref 27–159)
Total Iron Binding Capacity: 356 ug/dL (ref 250–450)
UIBC: 282 ug/dL (ref 131–425)

## 2018-07-27 ENCOUNTER — Encounter: Payer: Self-pay | Admitting: Internal Medicine

## 2018-07-31 ENCOUNTER — Encounter: Payer: Self-pay | Admitting: Neurology

## 2018-07-31 ENCOUNTER — Ambulatory Visit: Payer: BLUE CROSS/BLUE SHIELD | Admitting: Neurology

## 2018-07-31 VITALS — BP 137/94 | HR 102 | Ht 63.0 in | Wt 330.0 lb

## 2018-07-31 DIAGNOSIS — I10 Essential (primary) hypertension: Secondary | ICD-10-CM

## 2018-07-31 DIAGNOSIS — R0683 Snoring: Secondary | ICD-10-CM

## 2018-07-31 DIAGNOSIS — R519 Headache, unspecified: Secondary | ICD-10-CM

## 2018-07-31 DIAGNOSIS — Z6841 Body Mass Index (BMI) 40.0 and over, adult: Secondary | ICD-10-CM

## 2018-07-31 DIAGNOSIS — R51 Headache: Secondary | ICD-10-CM

## 2018-07-31 DIAGNOSIS — E669 Obesity, unspecified: Secondary | ICD-10-CM

## 2018-07-31 DIAGNOSIS — R0681 Apnea, not elsewhere classified: Secondary | ICD-10-CM

## 2018-07-31 DIAGNOSIS — R5382 Chronic fatigue, unspecified: Secondary | ICD-10-CM

## 2018-07-31 NOTE — Progress Notes (Signed)
SLEEP MEDICINE CLINIC   Provider:  Larey Seat,  MD   Primary Care Physician:  Shelby Mattocks, PA-C   Referring Provider: [name], MD    Chief Complaint  Patient presents with  . New Patient (Initial Visit)    pt with husband, rm 38, never had a sleep study completed. pt husband  states that she snores sometimes. and he hasnt witnessed any apneas. pt states that she wakes up frequently during the night and complains of daytime fatigue.     HPI:  Mikayla Rios is a 33 y.o. female  Patient , seen on 07-31-2018 upon  Referral from Dr. Baird Cancer for a sleep consultation.   Chief complaint according to patient: " I don't snore unless very tired, don't sleep much, and I wake up frequently" Mikayla Rios estimated her average sleep time 4-5 hours each night.  She reports having HTN, superobesity, pain in the left arm and shoulder. Has supposingly carpal tunnel on the left, but also pain in her upper arm, up to shoulder and axilla. She has a pending worker's comp case.   Sleep habits are as follows: Work ends for Mikayla Rios at around 7 PM dinnertime at home will be around 8 PM.  She keeps an early bedtime after her children are in bed and usually by 930 will go to her bedroom as well.  She may not sleep until about 11 PM.  Mikayla Rios reports that his wife has been more restless at night.  She has tried to sleep more on her back in order to get some relief for shoulder and back, but she is actually more used to sleeping on her sides.  She uses 2 pillows for head support.  The bedroom is described as cool, quiet and dark.  She is using a humidifier also to ease her breathing. She wakes about 4 AM, has a bathroom break and often struggles to go to bed. Her husband's alarm rings at 4.15 . She may leave the bedroom and goes to couch.  She rises at 5.30 AM, the children's bus comes at 6.55 AM ( 100 , 62 and 35 years old).    Sleep medical history: sleep problems began many years ago: "  I cant remember when I had last a good nights rest" . She worked night shifts working 8 to 8, and  after that another schedule until 1 AM . 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 HTN, first evident with pre-ecclampsia in pregnancy.   Family sleep history: her father was recently diagnosed with CHF - 35 years old, had  SG and was positive for OSA.   Social history: office job, ona computer all day,  married, 3 biological children and a 36/16 /27 year old.   Never smoker, ETOH- social- none since November. Caffeine ; stopped in November.   Review of Systems: Out of a complete 14 system review, the patient complains of only the following symptoms, and all other reviewed systems are negative.  shoulder and hand pain,  Snoring - but not every night. No gasping, no choking.  Epworth score :  9/ 24  , Fatigue severity score: 50/ 63  , depression score: n/a   Naps do not refresh, no sleep paralysis. Dropping objects with the left hand , right hand dominant patient.    Social History   Socioeconomic History  . Marital status: Married    Spouse name: Not on file  . Number of children:  3  . Years of education: Not on file  . Highest education level: Not on file  Occupational History  . Not on file  Social Needs  . Financial resource strain: Not on file  . Food insecurity:    Worry: Not on file    Inability: Not on file  . Transportation needs:    Medical: Not on file    Non-medical: Not on file  Tobacco Use  . Smoking status: Never Smoker  . Smokeless tobacco: Never Used  Substance and Sexual Activity  . Alcohol use: Not Currently  . Drug use: No  . Sexual activity: Yes    Birth control/protection: None  Lifestyle  . Physical activity:    Days per week: Not on file    Minutes per session: Not on file  . Stress: Not on file  Relationships  . Social connections:    Talks on phone: Not on file    Gets together: Not on file    Attends religious service: Not on file      Active member of club or organization: Not on file    Attends meetings of clubs or organizations: Not on file    Relationship status: Not on file  . Intimate partner violence:    Fear of current or ex partner: Not on file    Emotionally abused: Not on file    Physically abused: Not on file    Forced sexual activity: Not on file  Other Topics Concern  . Not on file  Social History Narrative  . Not on file    Family History  Problem Relation Age of Onset  . Cancer Mother        breast, and in remission  . Diabetes Mother   . Hypertension Mother   . Autism Son   . Heart disease Paternal Aunt   . Cancer Paternal Uncle        colon  . Diabetes Maternal Grandmother   . Hypertension Maternal Grandmother   . Diabetes Maternal Grandfather   . Kidney disease Maternal Grandfather   . Diabetes Paternal Grandmother   . Cancer Paternal Grandfather        colon  . Congestive Heart Failure Father   . Hypertension Father     Past Medical History:  Diagnosis Date  . Allergy    seasonal  . Blood infection ([place]) 1985  . Blood transfusion without reported diagnosis   . Hypertension     Past Surgical History:  Procedure Laterality Date  . NO PAST SURGERIES      Current Outpatient Medications  Medication Sig Dispense Refill  . clindamycin (CLEOCIN) 150 MG capsule Take by mouth every 6 (six) hours.    . ferrous sulfate 325 (65 FE) MG tablet Take 325 mg by mouth 2 (two) times daily.    Marland Kitchen lisinopril-hydrochlorothiazide (ZESTORETIC) 20-12.5 MG tablet Take 1 tablet by mouth daily. 30 tablet 0   No current facility-administered medications for this visit.     Allergies as of 07/31/2018 - Review Complete 07/31/2018  Allergen Reaction Noted  . Shellfish allergy Hives 11/22/2012  . Penicillins Hives and Rash 11/22/2012    Vitals: BP (!) 137/94   Pulse (!) 102   Ht 5\' 3"  (1.6 m)   Wt (!) 330 lb (149.7 kg)   BMI 58.46 kg/m  Last Weight:  Wt Readings from Last 1 Encounters:   07/31/18 (!) 330 lb (149.7 kg)   VZD:GLOV mass index is 58.46 kg/m.  Last Height:   Ht Readings from Last 1 Encounters:  07/31/18 5\' 3"  (1.6 m)    Physical exam:  General: The patient is awake, alert and appears not in acute distress. The patient is well groomed. Head: Normocephalic, atraumatic. Neck is supple. Mallampati 3-4 neck circumference:17" . Nasal airflow: alergic rhinitis- non seasonal.  Congested   Retrognathia is mild .  Cardiovascular:  Regular rate and rhythm , without  murmurs or carotid bruit, and without distended neck veins. Respiratory: Lungs are clear to auscultation. Skin:  Without evidence of edema, or rash Trunk: BMI is 58.     Neurologic exam : The patient is awake and alert, oriented to place and time.   Speech is fluent,  without  dysarthria, dysphonia or aphasia.  Mood and affect are appropriate.  Cranial nerves: Pupils are equal and briskly reactive to light. Funduscopic exam without  evidence of pallor or edema. Extraocular movements  in vertical and horizontal planes intact and without nystagmus. Visual fields by finger perimetry are intact. Hearing to finger rub intact.  Facial sensation intact to fine touch.  Facial motor strength is symmetric and tongue and uvula move midline.  She reports a deep ache on the left shoulder but ROM intact, Shoulder shrug was symmetrical.   Motor exam:  Normal tone, muscle bulk and symmetric strength in all extremities. Atrophy of the thenar eminence on the left hand, grip is weaker.  Sensory:  Fine touch, pinprick and vibration were tested in all extremities. Proprioception tested in the upper extremities was normal. Coordination: Rapid alternating movements in the fingers/hands was normal. Finger-to-nose maneuver  normal without evidence of ataxia, dysmetria or tremor. Gait and station: Patient walks without assistive device. Strength within normal limits. Stance is stable and normal.   Deep tendon reflexes: in  the  upper and lower extremities are symmetric and intact.   Assessment:  After physical and neurologic examination, review of laboratory studies,  Personal review of imaging studies, reports of other /same  Imaging studies, results of polysomnography and / or neurophysiology testing and pre-existing records as far as provided in visit., my assessment is:   1) Mikayla Rios presents with several risk factors for obstructive sleep apnea, including a higher body mass index at 58 kg/m, and above average neck circumference and a Mallampati that is moderately narrow.  She has mild retrognathia.  She does not have any underlying pulmonary disease she never carried a diagnosis of heart disease either.  She does not report orthopnea, sleeps on 2 pillows without the head of bed being elevated.   2)  It seems that her sleep has become more restless as she has other unrelated pains and discomfort.  She also has a history history of third shift work which often lingers and makes it harder to find uninterrupted sleep at night.  She reports averaging 4 to 5 hours of sleep but she does spend about 7 hours in bed usually.   The patient was advised of the nature of the diagnosed disorder , the treatment options and the  risks for general health and wellness arising from not treating the condition.   I spent more than 45 minutes of face to face time with the patient.  Greater than 50% of time was spent in counseling and coordination of care. We have discussed the diagnosis and differential and I answered the patient's questions.    Plan:  Treatment plan and additional workup :  Screeening for OSA- morning headaches, some snoring  and high fatigue in the setting of super obese.  She is referred to medical weight management and waits workmen's comp evaluation and authorization for carpal tunnel treatment.  Larey Seat, MD 0/93/1121, 62:44 AM  Certified in Neurology by ABPN Certified in [place] by  [facility] [address], [place], [place] [address]

## 2018-07-31 NOTE — Addendum Note (Signed)
Addended by: Larey Seat on: 07/31/2018 12:20 PM   Modules accepted: Orders

## 2018-08-10 ENCOUNTER — Ambulatory Visit: Payer: Self-pay | Admitting: Dietician

## 2018-08-11 ENCOUNTER — Other Ambulatory Visit: Payer: Self-pay | Admitting: Internal Medicine

## 2018-08-14 ENCOUNTER — Ambulatory Visit (INDEPENDENT_AMBULATORY_CARE_PROVIDER_SITE_OTHER): Payer: BLUE CROSS/BLUE SHIELD | Admitting: Neurology

## 2018-08-14 DIAGNOSIS — R0683 Snoring: Secondary | ICD-10-CM

## 2018-08-14 DIAGNOSIS — R5382 Chronic fatigue, unspecified: Secondary | ICD-10-CM

## 2018-08-14 DIAGNOSIS — G4733 Obstructive sleep apnea (adult) (pediatric): Secondary | ICD-10-CM

## 2018-08-14 DIAGNOSIS — R0681 Apnea, not elsewhere classified: Secondary | ICD-10-CM

## 2018-08-14 DIAGNOSIS — E669 Obesity, unspecified: Secondary | ICD-10-CM

## 2018-08-14 DIAGNOSIS — I10 Essential (primary) hypertension: Secondary | ICD-10-CM

## 2018-08-15 ENCOUNTER — Encounter: Payer: Self-pay | Admitting: Internal Medicine

## 2018-08-16 ENCOUNTER — Other Ambulatory Visit: Payer: Self-pay | Admitting: Internal Medicine

## 2018-08-16 ENCOUNTER — Encounter: Payer: Self-pay | Admitting: Internal Medicine

## 2018-08-16 ENCOUNTER — Ambulatory Visit: Payer: BLUE CROSS/BLUE SHIELD | Admitting: Internal Medicine

## 2018-08-16 ENCOUNTER — Other Ambulatory Visit: Payer: Self-pay

## 2018-08-16 VITALS — BP 140/80 | HR 114 | Temp 98.5°F | Ht 62.4 in | Wt 316.6 lb

## 2018-08-16 DIAGNOSIS — K068 Other specified disorders of gingiva and edentulous alveolar ridge: Secondary | ICD-10-CM | POA: Diagnosis not present

## 2018-08-16 DIAGNOSIS — R5082 Postprocedural fever: Secondary | ICD-10-CM

## 2018-08-16 LAB — CBC WITH DIFFERENTIAL/PLATELET
Basophils Absolute: 0.1 10*3/uL (ref 0.0–0.2)
Basos: 0 %
EOS (ABSOLUTE): 0 10*3/uL (ref 0.0–0.4)
Eos: 0 %
Hematocrit: 31.8 % — ABNORMAL LOW (ref 34.0–46.6)
Hemoglobin: 10.5 g/dL — ABNORMAL LOW (ref 11.1–15.9)
Immature Grans (Abs): 0.1 10*3/uL (ref 0.0–0.1)
Immature Granulocytes: 1 %
LYMPHS ABS: 2.5 10*3/uL (ref 0.7–3.1)
Lymphs: 12 %
MCH: 26 pg — ABNORMAL LOW (ref 26.6–33.0)
MCHC: 33 g/dL (ref 31.5–35.7)
MCV: 79 fL (ref 79–97)
Monocytes Absolute: 1.2 10*3/uL — ABNORMAL HIGH (ref 0.1–0.9)
Monocytes: 6 %
Neutrophils Absolute: 16.8 10*3/uL — ABNORMAL HIGH (ref 1.4–7.0)
Neutrophils: 81 %
Platelets: 570 10*3/uL — ABNORMAL HIGH (ref 150–450)
RBC: 4.04 x10E6/uL (ref 3.77–5.28)
RDW: 15.4 % (ref 11.7–15.4)
WBC: 20.8 10*3/uL (ref 3.4–10.8)

## 2018-08-16 LAB — POCT URINALYSIS DIPSTICK OB
Glucose, UA: NEGATIVE
Ketones, UA: NEGATIVE
Leukocytes, UA: NEGATIVE
Nitrite, UA: NEGATIVE
SPEC GRAV UA: 1.025 (ref 1.010–1.025)
Urobilinogen, UA: 1 E.U./dL
pH, UA: 5.5 (ref 5.0–8.0)

## 2018-08-16 NOTE — Progress Notes (Signed)
* Subjective:     Patient ID: Mikayla Rios , female    DOB: 16-Mar-1986 , 33 y.o.   MRN: [id number]   Chief Complaint  Patient presents with  . Fever    no other symptoms-on antibiotics for teeth extractions    HPI She has been having fevers since Monday up to 100.5 and still taking Clindamycin for post teeth extraction. She denies any symptoms of respiratory or urinary symptoms. Her abd has been cramping on lower area, but she is also on her cycle. When she gets the fever, she gets sweaty and sometimes gets chills. The areas of tooth extraction are still sore, and called the dentist and they did not think it was related to gum areas since the day before the onset of fever she had some food stuck in the hole where one of the extractions occurred and has no sutures.  She uses a Nurse, adult.   Past Medical History:  Diagnosis Date  . Allergy    seasonal  . Blood infection ([place]) 1985  . Blood transfusion without reported diagnosis   . Hypertension      Family History  Problem Relation Age of Onset  . Cancer Mother        breast, and in remission  . Diabetes Mother   . Hypertension Mother   . Autism Son   . Heart disease Paternal Aunt   . Cancer Paternal Uncle        colon  . Diabetes Maternal Grandmother   . Hypertension Maternal Grandmother   . Diabetes Maternal Grandfather   . Kidney disease Maternal Grandfather   . Diabetes Paternal Grandmother   . Cancer Paternal Grandfather        colon  . Congestive Heart Failure Father   . Hypertension Father      Current Outpatient Medications:  .  clindamycin (CLEOCIN) 150 MG capsule, Take by mouth every 6 (six) hours., Disp: , Rfl:  .  ferrous sulfate 325 (65 FE) MG tablet, Take 325 mg by mouth 2 (two) times daily., Disp: , Rfl:  .  lisinopril-hydrochlorothiazide (PRINZIDE,ZESTORETIC) 20-12.5 MG tablet, TAKE 1 TABLET BY MOUTH EVERY DAY, Disp: 30 tablet, Rfl: 0 .  lisinopril-hydrochlorothiazide (PRINZIDE,ZESTORETIC)  10-12.5 MG tablet, TAKE 1 TABLET BY MOUTH EVERY DAY (Patient not taking: Reported on 08/16/2018), Disp: 30 tablet, Rfl: 1   Allergies  Allergen Reactions  . Shellfish Allergy Hives  . Penicillins Hives and Rash    Has patient had a PCN reaction causing immediate rash, facial/tongue/throat swelling, SOB or lightheadedness with hypotension: No Has patient had a PCN reaction causing severe rash involving mucus membranes or skin necrosis: hives Has patient had a PCN reaction that required hospitalization No Has patient had a PCN reaction occurring within the last 10 years: yes If all of the above answers are "NO", then may proceed with Cephalosporin use.      Review of Systems  Constitutional: Negative for diaphoresis and fever.  HENT: Negative for congestion, postnasal drip, rhinorrhea and sore throat.   Respiratory: Negative for cough, shortness of breath and wheezing.   Cardiovascular: Negative for chest pain.  Gastrointestinal: Negative for abdominal pain, diarrhea, nausea and vomiting.  Genitourinary: Negative for dyspareunia, dysuria, frequency, urgency and vaginal discharge.  Musculoskeletal: Negative for arthralgias and myalgias.  Skin: Negative for rash and wound.       Denies itching  Neurological: Positive for light-headedness. Negative for dizziness, weakness and headaches.       On occasion feels  light headed when she gets up too fast.   Hematological: Negative for adenopathy.     Today's Vitals   08/16/18 1043  BP: 140/80  Pulse: (!) 114  Temp: 98.5 F (36.9 C)  TempSrc: Oral  SpO2: 97%  Weight: (!) 316 lb 9.6 oz (143.6 kg)  Height: 5' 2.4" (1.585 m)   Body mass index is 57.17 kg/m.   Objective:  Physical Exam Vitals signs and nursing note reviewed.  Constitutional:      General: She is not in acute distress.    Appearance: She is obese.  HENT:     Head: Normocephalic.     Right Ear: Tympanic membrane, ear canal and external ear normal.     Left Ear:  Tympanic membrane, ear canal and external ear normal.     Nose: Nose normal.     Mouth/Throat:     Mouth: Mucous membranes are moist.     Comments: Pharynx- clear. GUMS- L lower gum area where she had an extraction is erythematous on the outer border and is tender. The L upper area from the other extraction is pink and not tender.  Eyes:     General: No scleral icterus.    Conjunctiva/sclera: Conjunctivae normal.  Neck:     Musculoskeletal: Neck supple. No neck rigidity.  Cardiovascular:     Rate and Rhythm: Regular rhythm.     Heart sounds: No murmur.  Pulmonary:     Effort: Pulmonary effort is normal.     Breath sounds: Normal breath sounds.  Abdominal:     General: Abdomen is flat. Bowel sounds are normal. There is no distension.     Tenderness: There is no abdominal tenderness. There is no guarding.  Musculoskeletal: Normal range of motion.  Lymphadenopathy:     Cervical: No cervical adenopathy.  Skin:    General: Skin is warm and dry.  Neurological:     Mental Status: She is alert and oriented to person, place, and time.  Psychiatric:        Mood and Affect: Mood normal.        Behavior: Behavior normal.        Judgment: Judgment normal.    UA- + large blood, but no nitrates or white cells. Pt in on her period.  Assessment And Plan:    1. Post-procedural fever- I suspect from her gums, may have a food particle under them.  - POC Urinalysis Dipstick OB - CBC with Diff - POCT Urinalysis Dipstick (81002)  2. Pain in gums- acute. Advised to see dentist and to take her thermometer with her to compare with the dentist one.   I will inform her as soon as I have the CBC back.     Mikayla Lenderman [name], PA-C

## 2018-08-16 NOTE — Patient Instructions (Signed)
I DO NOT SEE ANY INFECTION OR SOURCE OF FEVER EXCEPT REDNESS AND TENDERNESS OF YOUR GUMS. PLEASE CALL YOUR DENTIST. FOR NOW I WILL CHECK YOUR WHITE COUNT TO SEE IF THERE IS A SYSTEMIC INFECTION.   Pericoronitis Pericoronitis is inflammation of the gum that surrounds a tooth that has not come all the way through the gum (partially erupted or impacted). It often occurs when food particles are trapped in the area between the gum and the tooth and bacteria cause an infection. Pericoronitis usually affects the bottom wisdom teeth. Wisdom teeth are also called third molars. These are the last teeth to erupt, usually when people are 58-73 years old. Pericoronitis is most common in people in their 60s. Pericoronitis may start suddenly (acute) and can cause pain and swelling. The infection can also spread to the soft tissues around the tooth and lower jaw. What are the causes? This condition is usually caused by the bacteria that normally live in your mouth (anaerobic bacteria). What increases the risk? The main risk factor for pericoronitis is a wisdom tooth that is coming in slowly or sideways. Other risk factors include:  Poor dental care (oral hygiene).  An upper molar that irritates the lower molar when chewing (occlusal trauma).  Gum disease (gingivitis).  Lowered resistance to infection. Illness and pregnancy are among possible causes of this. What are the signs or symptoms? Symptoms of this condition may include:  Pain.  Redness and swelling of the gum.  Swelling of the cheek or jaw.  Bad breath.  Bad taste in the mouth.  Fever.  Stiff and painful jaw movement (trismus). How is this diagnosed? This condition may be diagnosed based on your symptoms. You will also have a dental exam, which may include X-rays. How is this treated? Treatment of acute pericoronitis may include:  Injecting numbing medicine and germ-killing solution into the infected area. The gum area over the tooth  is then opened to flush out pus, bacteria, and trapped food particles (debris).  Grinding down an upper tooth that is causing occlusal trauma.  Giving oral antibiotic medicine. If you have pericoronitis that keeps coming back, you may need to have the affected wisdom tooth removed (extracted). Follow these instructions at home:      Take over-the-counter and prescription medicines only as told by your health care provider.  If you were prescribed an antibiotic medicine, take or apply it as told by your health care provider. Do not stop taking the antibiotic even if you start to feel better.  Do not use any products that contain nicotine or tobacco, such as cigarettes and e-cigarettes. If you need help quitting, ask your health care provider.  Eat soft foods until pain and swelling have gone away.  Gargle with a salt-water mixture 3-4 times a day or as needed. To make a salt-water mixture, completely dissolve -1 tsp of salt in 1 cup of warm water. You may also use a mouthwash that is recommended by your health care provider.  Practice good oral hygiene by flossing and brushing daily.  Keep all follow-up visits as told by your health care provider. This is important. Contact a health care provider if you have:  Pain or swelling in your gum or jaw.  A fever.  A constant bad taste in your mouth or bad breath. Get help right away if you have:  Swelling in your mouth or jaw that makes it hard to swallow or breathe. Summary  Pericoronitis is inflammation of the gum that  surrounds a tooth that has not come all the way through the gum.  Pericoronitis occurs when food particles and bacteria cause an infection in the area between the gum and the tooth.  Treatment depends on the cause, but it may include using medicines, grinding down an upper tooth that is irritating a lower tooth, or removing the tooth.  Follow your health care provider's instructions for caring for your tooth. These  may include taking medicines, eating soft foods, brushing gently, and knowing when to contact your health care provider. This information is not intended to replace advice given to you by your health care provider. Make sure you discuss any questions you have with your health care provider. Document Released: 07/01/2004 Document Revised: 06/28/2017 Document Reviewed: 06/28/2017 Elsevier Interactive Patient Education  2019 [employer].

## 2018-08-17 ENCOUNTER — Encounter

## 2018-08-17 ENCOUNTER — Institutional Professional Consult (permissible substitution): Payer: BLUE CROSS/BLUE SHIELD | Admitting: Neurology

## 2018-08-21 ENCOUNTER — Encounter: Payer: Self-pay | Admitting: Internal Medicine

## 2018-08-22 ENCOUNTER — Other Ambulatory Visit: Payer: Self-pay

## 2018-08-22 ENCOUNTER — Encounter: Payer: BLUE CROSS/BLUE SHIELD | Attending: Internal Medicine | Admitting: Dietician

## 2018-08-22 ENCOUNTER — Telehealth: Payer: Self-pay | Admitting: Internal Medicine

## 2018-08-22 ENCOUNTER — Encounter: Payer: Self-pay | Admitting: Dietician

## 2018-08-22 VITALS — Wt 317.0 lb

## 2018-08-22 DIAGNOSIS — E669 Obesity, unspecified: Secondary | ICD-10-CM

## 2018-08-22 NOTE — Progress Notes (Signed)
Medical Nutrition Therapy  Appt Start Time: 10:00am End Time: 11:00am  Primary concerns today: weight management   Referral diagnosis: obesity and hypertension Preferred learning style: no preference indicated Learning readiness: ready   NUTRITION ASSESSMENT   Anthropometrics  Weight: 317 lbs  BMI: 57.24 Highest reported wt is 350 lbs, pt's weight goal within the next 3 months is <300 lbs.   Biochemical Data & Labs BP: recent readings 140/80, 137/94, 140/82, 148/88  Clinical Medical Hx: obesity, HTN Surgeries: N/A Medications: lisinopril, clindamycin  Allergies: shellfish, penicillin   Psychosocial/Lifestyle Present for appointment with her husband. They have 3 children (35, 76, 85 years old) and one has special needs. 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 Pt does not smoke. Pt states her husband was in an accident at the beginning of the year, and she had 5 teeth pulled a few weeks ago, so these things have thrown their schedule off lately. Both were engaged during today's appointment and asked good questions, and seemed very interested in nutrition.   24-Hr Dietary Recall First Meal: oatmeal (1 pack instant)  Snack: none Second Meal: tropical smoothie (8 oz)  Snack: none Third Meal: shrimp (6) + pasta (1 cup) + peppers and onions  Snack: talenti ice cream (couple of bites)  Beverages: water, flavored aloe water   Food & Nutrition Related Hx Dietary Hx: Pt and her husband state they followed a vegan diet in January. Then, they added chicken and seafood back in throughout February. Pt states she had 5 teeth pulled so for the past couple of weeks she has eaten mostly soups, jello, applesauce, grits, oatmeal, ramen noodles, smoothies. Other than water, pt sometimes drinks OJ, apple juice, and aloe water. Pt states that she (prior to teeth being pulled) usually eats more seafood and snacks, such as pudding, applesauce, fruit, and chips.  Pt states  she and her husband have made significant dietary changes since the beginning of the year, including limiting meat, eliminating sodas, and eating more vegetables/ plant-based foods. They still incorporate plant-based meats since "discovering" them when they went vegan. Additionally, they still drink soy milk vs cow's milk. Pt states they also use plant-based butter vs regular.  Estimated Daily Fluid Intake: 16.9 oz x 3 bottles Supplements: iron  GI / Other Notable Symptoms: N/A  Sleep: 4-5 hours/night. Pt states she had tests done recently and is participating in a sleep study soon. Stress / Self-Care: Pt states work is somewhat stressful, more so "annoying." Husband seems very supportive.   Physical Activity  Current average weekly physical activity: ADLs. Pt states she and her husband have not been as physically active lately dt her still recovering from having teeth pulled and her husband still recovering from an accident.   Estimated Energy Needs Calories: 1800 Carbohydrate: 200g Protein: 113g Fat: 60g   NUTRITION DIAGNOSIS  Physical inactivity (NB-2.1) related to lifestyle as evidenced by continued pain and recovery from having teeth pulled, obesity (BMI 57), and reports of infrequent physical activity.    NUTRITION INTERVENTION  Nutrition education (E-1) on the following topics:  . General healthful diet: MyPlate, food groups, balanced eating . Heart health: low sodium, types of fat, plant-based  . Physical activity/ lifestyle: activity goal per week, stress management, adequate sleep  Pt asked about cutting out carbohydrates from her diet altogether. I counseled her on the pros/cons of this, including how difficult and unsustainable it typically is for most people, the key nutrients/food groups that are  eliminated, and how weight fluctuations are involved when/if even small amounts of carbohydrates are added back into the diet. Rather, I suggested lowering her current intake amount  of carbohydrate foods by focusing on adding in more protein foods and sources of unsaturated fats. Additionally, we reviewed the best sources of complex carbohydrates, including fruits, whole grains, and beans/legumes.   Handouts Provided Include   MyPlate   Meal Ideas   Low Sodium Flavoring Options   Types of Fat  Learning Style & Readiness for Change Teaching method utilized: Visual & Auditory  Demonstrated degree of understanding via: Teach Back  Barriers to learning/adherence to lifestyle change: None Identified   Goals Established by Pt . Aim to walk and/or do cardiovascular activity for a total of 150 minutes per week.  . Aim to play with the kids more to also encourage more physical activity.  . Aim to incorporate plenty of unsaturated fats and protein foods every day to keep diet balanced.  . Pt states she would like to weigh less than 300 lbs within the next 3 months.    MONITORING & EVALUATION Dietary intake, weekly physical activity, sleep, and goals in 2 months.  RD's Notes for Next Visit  . Plant-based (suggest other proteins/ protein drinks) . Balanced breakfast and snacks handouts  . Sleep   Next Steps  Patient is to return to [facility] for follow up appointment in 2 months.

## 2018-08-22 NOTE — Addendum Note (Signed)
Addended by: Larey Seat on: 08/22/2018 10:38 AM   Modules accepted: Orders

## 2018-08-22 NOTE — Procedures (Signed)
NAME:  Mikayla Rios                                                                           DOB: 12/23/1985 MEDICAL RECORD no:  [id number]                                                   DOS: 08/14/2018 REFERRING PHYSICIAN: [name], MD STUDY PERFORMED: [facility] [employer] HISTORY: Mikayla Rios is a 33 y.o. female Patient, seen on 07-31-2018 upon referral from Dr. Baird Cancer for a sleep consultation.   Chief complaint according to patient: "I don't snore unless I am very tired, don't sleep much, and I wake up frequently". [name] estimated her average sleep time to be 4-5 hours each night.  She reports having HTN, super-obesity, pain in the left arm and shoulder. Has supposingly carpal tunnel on the left, but also pain in her upper arm, up to shoulder and axilla. She has a pending worker's comp case.   Sleep Habits: Dinnertime at home at 8 PM.  She keeps an early bedtime after her children are in bed and usually will go to her bedroom by 9.30 PM.  She may not sleep until about 11 PM.  Mr. Grippi reports that his wife has been more restless at night.  She uses 2 pillows for head support.  The bedroom is described as cool, quiet and dark. She is using a humidifier also to ease her breathing. She wakes about 4 AM, has a bathroom break and often struggles to go back to bed. Her husband's alarm rings at 4.15 AM. She may leave the bedroom and goes to couch. She rises at 5.30 AM, the children's school bus comes at 6.55 AM (they are 80, 66 and 35 years old). Epworth Sleepiness score:  9/ 24 points, Fatigue severity score: 50/ 63 points, BMI: 58.6 kg/m2.    STUDY RESULTS:  Total Recording Time: 7 h 57 mins; Calculated Sleep Time: 6 h and 11 m. Total Apnea/Hypopnea Index (AHI): 61.3 /h; RDI: 61.3 /h; REM AHI:  61.3/h Average Oxygen Saturation:  93 %; Lowest Oxygen Saturation:  87 % Total Time in Oxygen Saturation < 89 %: 0.37minutes  Average Heart Rate: 101 bpm (between 65 and 124 bpm). IMPRESSION: Severe sleep  apnea with AHI of 61/h- but not associated with a clinically significant hypoxemia.  RECOMMENDATION: CPAP therapy- This is needed in light of the urgent medical need to lose weight and reduce BMi to 35. An autotitration device will be needed as it adjusts to these changes Order for CPAP autotitration device heated humidity and pressure window from 5 -18 cm water 2with 3 cm EPR. Please let the patient try mask and choose her own, most comfortable device. I certify that I have reviewed the raw data recording prior to the issuance of this report in accordance with the standards of the American Academy of Sleep Medicine (AASM). Larey Seat, M.D.     08-22-2018   Medical Director of [facility] at [facility], accredited by the AASM. Diplomat of the ABPN  and ABSM.

## 2018-08-22 NOTE — Telephone Encounter (Signed)
Workers Comp Rep called about patient information. NO Medical Release form on file for the company called patient to get a verbal consent to speak with Rep

## 2018-08-22 NOTE — Patient Instructions (Signed)
.   Aim to walk and/or do cardiovascular activity for a total of 150 minutes per week.  . Aim to play with the kids more to also encourage more physical activity.  . Aim to incorporate plenty of unsaturated fats and protein foods every day to keep diet balanced.   See you at your follow up!

## 2018-08-23 ENCOUNTER — Encounter: Payer: Self-pay | Admitting: Neurology

## 2018-08-23 ENCOUNTER — Telehealth: Payer: Self-pay | Admitting: Neurology

## 2018-08-23 NOTE — Telephone Encounter (Signed)
Pt has called RN Casey back, she is asking for a call back °

## 2018-08-23 NOTE — Telephone Encounter (Signed)
Called the patient again, no answer. I will message on mychart if that is easier for the patient.

## 2018-08-23 NOTE — Telephone Encounter (Signed)
Called patient to discuss sleep study results. No answer at this time. LVM for the patient to call back.   

## 2018-08-23 NOTE — Telephone Encounter (Signed)
-----   Message from Larey Seat, MD sent at 08/22/2018 10:38 AM EDT ----- IMPRESSION: Severe sleep apnea with AHI of 61/h- but not  associated with a clinically significant hypoxemia.  RECOMMENDATION: CPAP therapy- This is needed in light of the  urgent medical need to lose weight and reduce BMI to 35. An  autotitration device will be needed as it adjusts to these  Changes.  Order for CPAP autotitration device heated humidity and  pressure window from 5 -18 cm water 2with 3 cm EPR. Please let  the patient try mask and choose her own, most comfortable device.

## 2018-08-24 ENCOUNTER — Encounter: Payer: Self-pay | Admitting: Internal Medicine

## 2018-08-30 ENCOUNTER — Encounter: Payer: Self-pay | Admitting: Internal Medicine

## 2018-09-05 ENCOUNTER — Telehealth: Payer: Self-pay | Admitting: Neurology

## 2018-09-05 DIAGNOSIS — G4733 Obstructive sleep apnea (adult) (pediatric): Secondary | ICD-10-CM | POA: Diagnosis not present

## 2018-09-05 NOTE — Telephone Encounter (Signed)
Left message for pt to call back to get scheduled for ov for initial cpap follow up with Dr. Brett Fairy, [place], [place], or [place].

## 2018-09-06 ENCOUNTER — Encounter: Payer: BLUE CROSS/BLUE SHIELD | Admitting: Neurology

## 2018-09-10 ENCOUNTER — Other Ambulatory Visit: Payer: Self-pay | Admitting: Internal Medicine

## 2018-09-21 ENCOUNTER — Other Ambulatory Visit: Payer: Self-pay

## 2018-09-21 ENCOUNTER — Encounter: Payer: Self-pay | Admitting: Internal Medicine

## 2018-09-21 MED ORDER — LISINOPRIL-HYDROCHLOROTHIAZIDE 10-12.5 MG PO TABS: 1.0000 | ORAL_TABLET | Freq: Every day | ORAL | 1 refills | Status: DC

## 2018-09-21 MED ORDER — LISINOPRIL-HYDROCHLOROTHIAZIDE 20-12.5 MG PO TABS: 1.0000 | ORAL_TABLET | Freq: Every day | ORAL | 1 refills | Status: DC

## 2018-10-02 ENCOUNTER — Telehealth: Payer: Self-pay

## 2018-10-02 NOTE — Telephone Encounter (Signed)
LVM to let pt know that her appt has been changed

## 2018-10-03 ENCOUNTER — Ambulatory Visit: Payer: BLUE CROSS/BLUE SHIELD | Admitting: Audiology

## 2018-10-05 DIAGNOSIS — G4733 Obstructive sleep apnea (adult) (pediatric): Secondary | ICD-10-CM | POA: Diagnosis not present

## 2018-10-09 ENCOUNTER — Encounter: Payer: Self-pay | Admitting: Internal Medicine

## 2018-10-12 ENCOUNTER — Other Ambulatory Visit: Payer: Self-pay

## 2018-10-12 ENCOUNTER — Ambulatory Visit: Payer: BLUE CROSS/BLUE SHIELD | Admitting: Internal Medicine

## 2018-10-12 ENCOUNTER — Encounter: Payer: Self-pay | Admitting: Internal Medicine

## 2018-10-12 VITALS — BP 136/82 | HR 98 | Temp 98.7°F | Wt 321.6 lb

## 2018-10-12 DIAGNOSIS — I10 Essential (primary) hypertension: Secondary | ICD-10-CM

## 2018-10-12 DIAGNOSIS — G5603 Carpal tunnel syndrome, bilateral upper limbs: Secondary | ICD-10-CM | POA: Diagnosis not present

## 2018-10-12 DIAGNOSIS — Z9989 Dependence on other enabling machines and devices: Secondary | ICD-10-CM

## 2018-10-12 DIAGNOSIS — G4733 Obstructive sleep apnea (adult) (pediatric): Secondary | ICD-10-CM | POA: Diagnosis not present

## 2018-10-12 DIAGNOSIS — Z6841 Body Mass Index (BMI) 40.0 and over, adult: Secondary | ICD-10-CM

## 2018-10-12 DIAGNOSIS — N920 Excessive and frequent menstruation with regular cycle: Secondary | ICD-10-CM | POA: Diagnosis not present

## 2018-10-12 MED ORDER — AMLODIPINE BESYLATE 5 MG PO TABS: 5.0000 mg | ORAL_TABLET | Freq: Every day | ORAL | 1 refills | Status: DC

## 2018-10-12 NOTE — Patient Instructions (Signed)
DASH Eating Plan  DASH stands for "Dietary Approaches to Stop Hypertension." The DASH eating plan is a healthy eating plan that has been shown to reduce high blood pressure (hypertension). It may also reduce your risk for type 2 diabetes, heart disease, and stroke. The DASH eating plan may also help with weight loss.  What are tips for following this plan?    General guidelines   Avoid eating more than 2,300 mg (milligrams) of salt (sodium) a day. If you have hypertension, you may need to reduce your sodium intake to 1,500 mg a day.   Limit alcohol intake to no more than 1 drink a day for nonpregnant women and 2 drinks a day for men. One drink equals 12 oz of beer, 5 oz of wine, or 1 oz of hard liquor.   Work with your health care provider to maintain a healthy body weight or to lose weight. Ask what an ideal weight is for you.   Get at least 30 minutes of exercise that causes your heart to beat faster (aerobic exercise) most days of the week. Activities may include walking, swimming, or biking.   Work with your health care provider or diet and nutrition specialist (dietitian) to adjust your eating plan to your individual calorie needs.  Reading food labels     Check food labels for the amount of sodium per serving. Choose foods with less than 5 percent of the Daily Value of sodium. Generally, foods with less than 300 mg of sodium per serving fit into this eating plan.   To find whole grains, look for the word "whole" as the first word in the ingredient list.  Shopping   Buy products labeled as "low-sodium" or "no salt added."   Buy fresh foods. Avoid canned foods and premade or frozen meals.  Cooking   Avoid adding salt when cooking. Use salt-free seasonings or herbs instead of table salt or sea salt. Check with your health care provider or pharmacist before using salt substitutes.   Do not fry foods. Cook foods using healthy methods such as baking, boiling, grilling, and broiling instead.   Cook with  heart-healthy oils, such as olive, canola, soybean, or sunflower oil.  Meal planning   Eat a balanced diet that includes:  ? 5 or more servings of fruits and vegetables each day. At each meal, try to fill half of your plate with fruits and vegetables.  ? Up to 6-8 servings of whole grains each day.  ? Less than 6 oz of lean meat, poultry, or fish each day. A 3-oz serving of meat is about the same size as a deck of cards. One egg equals 1 oz.  ? 2 servings of low-fat dairy each day.  ? A serving of nuts, seeds, or beans 5 times each week.  ? Heart-healthy fats. Healthy fats called Omega-3 fatty acids are found in foods such as flaxseeds and coldwater fish, like sardines, salmon, and mackerel.   Limit how much you eat of the following:  ? Canned or prepackaged foods.  ? Food that is high in trans fat, such as fried foods.  ? Food that is high in saturated fat, such as fatty meat.  ? Sweets, desserts, sugary drinks, and other foods with added sugar.  ? Full-fat dairy products.   Do not salt foods before eating.   Try to eat at least 2 vegetarian meals each week.   Eat more home-cooked food and less restaurant, buffet, and fast food.     When eating at a restaurant, ask that your food be prepared with less salt or no salt, if possible.  What foods are recommended?  The items listed may not be a complete list. Talk with your dietitian about what dietary choices are best for you.  Grains  Whole-grain or whole-wheat bread. Whole-grain or whole-wheat pasta. Brown rice. Oatmeal. Quinoa. Bulgur. Whole-grain and low-sodium cereals. Pita bread. Low-fat, low-sodium crackers. Whole-wheat flour tortillas.  Vegetables  Fresh or frozen vegetables (raw, steamed, roasted, or grilled). Low-sodium or reduced-sodium tomato and vegetable juice. Low-sodium or reduced-sodium tomato sauce and tomato paste. Low-sodium or reduced-sodium canned vegetables.  Fruits  All fresh, dried, or frozen fruit. Canned fruit in natural juice (without  added sugar).  Meat and other protein foods  Skinless chicken or turkey. Ground chicken or turkey. Pork with fat trimmed off. Fish and seafood. Egg whites. Dried beans, peas, or lentils. Unsalted nuts, nut butters, and seeds. Unsalted canned beans. Lean cuts of beef with fat trimmed off. Low-sodium, lean deli meat.  Dairy  Low-fat (1%) or fat-free (skim) milk. Fat-free, low-fat, or reduced-fat cheeses. Nonfat, low-sodium ricotta or cottage cheese. Low-fat or nonfat yogurt. Low-fat, low-sodium cheese.  Fats and oils  Soft margarine without trans fats. Vegetable oil. Low-fat, reduced-fat, or light mayonnaise and salad dressings (reduced-sodium). Canola, safflower, olive, soybean, and sunflower oils. Avocado.  Seasoning and other foods  Herbs. Spices. Seasoning mixes without salt. Unsalted popcorn and pretzels. Fat-free sweets.  What foods are not recommended?  The items listed may not be a complete list. Talk with your dietitian about what dietary choices are best for you.  Grains  Baked goods made with fat, such as croissants, muffins, or some breads. Dry pasta or rice meal packs.  Vegetables  Creamed or fried vegetables. Vegetables in a cheese sauce. Regular canned vegetables (not low-sodium or reduced-sodium). Regular canned tomato sauce and paste (not low-sodium or reduced-sodium). Regular tomato and vegetable juice (not low-sodium or reduced-sodium). Pickles. Olives.  Fruits  Canned fruit in a light or heavy syrup. Fried fruit. Fruit in cream or butter sauce.  Meat and other protein foods  Fatty cuts of meat. Ribs. Fried meat. Bacon. Sausage. Bologna and other processed lunch meats. Salami. Fatback. Hotdogs. Bratwurst. Salted nuts and seeds. Canned beans with added salt. Canned or smoked fish. Whole eggs or egg yolks. Chicken or turkey with skin.  Dairy  Whole or 2% milk, cream, and half-and-half. Whole or full-fat cream cheese. Whole-fat or sweetened yogurt. Full-fat cheese. Nondairy creamers. Whipped toppings.  Processed cheese and cheese spreads.  Fats and oils  Butter. Stick margarine. Lard. Shortening. Ghee. Bacon fat. Tropical oils, such as coconut, palm kernel, or palm oil.  Seasoning and other foods  Salted popcorn and pretzels. Onion salt, garlic salt, seasoned salt, table salt, and sea salt. Worcestershire sauce. Tartar sauce. Barbecue sauce. Teriyaki sauce. Soy sauce, including reduced-sodium. Steak sauce. Canned and packaged gravies. Fish sauce. Oyster sauce. Cocktail sauce. Horseradish that you find on the shelf. Ketchup. Mustard. Meat flavorings and tenderizers. Bouillon cubes. Hot sauce and Tabasco sauce. Premade or packaged marinades. Premade or packaged taco seasonings. Relishes. Regular salad dressings.  Where to find more information:   National Heart, Lung, and Blood Institute: www.nhlbi.nih.gov   American Heart Association: www.heart.org  Summary   The DASH eating plan is a healthy eating plan that has been shown to reduce high blood pressure (hypertension). It may also reduce your risk for type 2 diabetes, heart disease, and stroke.   With the   DASH eating plan, you should limit salt (sodium) intake to 2,300 mg a day. If you have hypertension, you may need to reduce your sodium intake to 1,500 mg a day.   When on the DASH eating plan, aim to eat more fresh fruits and vegetables, whole grains, lean proteins, low-fat dairy, and heart-healthy fats.   Work with your health care provider or diet and nutrition specialist (dietitian) to adjust your eating plan to your individual calorie needs.  This information is not intended to replace advice given to you by your health care provider. Make sure you discuss any questions you have with your health care provider.  Document Released: 05/13/2011 Document Revised: 05/17/2016 Document Reviewed: 05/17/2016  Elsevier Interactive Patient Education  2019 [employer]

## 2018-10-15 NOTE — Progress Notes (Signed)
Subjective:     Patient ID: Mikayla Rios , female    DOB: [birth year] , 33 y.o.   MRN: [id number]   Chief Complaint  Patient presents with  . Hypertension    HPI  This is my first time meeting Mikayla Rios, referred by her Mom, D. P., who is also a patient of [facility]. She was previously seen by Sunday Spillers, [place]. She reports her bp has been elevated and thought she should come in to get it checked out.   Hypertension  This is a chronic problem. The current episode started more than 1 year ago. The problem has been gradually improving since onset. The problem is controlled. Pertinent negatives include no blurred vision, chest pain, palpitations or shortness of breath. Risk factors for coronary artery disease include obesity, sedentary lifestyle and family history.     Past Medical History:  Diagnosis Date  . Allergy    seasonal  . Blood infection ([place]) 1985  . Blood transfusion without reported diagnosis   . Hypertension      Family History  Problem Relation Age of Onset  . Cancer Mother        breast, and in remission  . Diabetes Mother   . Hypertension Mother   . Autism Son   . Heart disease Paternal Aunt   . Cancer Paternal Uncle        colon  . Diabetes Maternal Grandmother   . Hypertension Maternal Grandmother   . Diabetes Maternal Grandfather   . Kidney disease Maternal Grandfather   . Diabetes Paternal Grandmother   . Cancer Paternal Grandfather        colon  . Congestive Heart Failure Father   . Hypertension Father      Current Outpatient Medications:  .  ferrous sulfate 325 (65 FE) MG tablet, Take 325 mg by mouth 2 (two) times daily., Disp: , Rfl:  .  lisinopril-hydrochlorothiazide (ZESTORETIC) 20-12.5 MG tablet, Take 1 tablet by mouth daily., Disp: 90 tablet, Rfl: 1 .  amLODipine (NORVASC) 5 MG tablet, Take 1 tablet (5 mg total) by mouth daily., Disp: 30 tablet, Rfl: 1   Allergies  Allergen Reactions  . Shellfish Allergy Hives  . Penicillins Hives and Rash    Has  patient had a PCN reaction causing immediate rash, facial/tongue/throat swelling, SOB or lightheadedness with hypotension: No Has patient had a PCN reaction causing severe rash involving mucus membranes or skin necrosis: hives Has patient had a PCN reaction that required hospitalization No Has patient had a PCN reaction occurring within the last 10 years: yes If all of the above answers are "NO", then may proceed with Cephalosporin use.      Review of Systems  Constitutional: Negative.   Eyes: Negative for blurred vision.  Respiratory: Negative.  Negative for shortness of breath.   Cardiovascular: Negative.  Negative for chest pain and palpitations.  Gastrointestinal: Negative.   Genitourinary: Positive for menstrual problem. Genital sores: she c/o heavy bleeding.   Neurological: Negative.   Psychiatric/Behavioral: Negative.      Today's Vitals   10/12/18 1105  BP: 136/82  Pulse: 98  Temp: 98.7 F (37.1 C)  TempSrc: Oral  Weight: (!) 321 lb 9.6 oz (145.9 kg)   Body mass index is 58.07 kg/m.   Objective:  Physical Exam Vitals signs and nursing note reviewed.  Constitutional:      Appearance: Normal appearance. She is obese.  HENT:     Head: Normocephalic and atraumatic.  Cardiovascular:  Rate and Rhythm: Normal rate and regular rhythm.     Heart sounds: Normal heart sounds.  Pulmonary:     Effort: Pulmonary effort is normal.     Breath sounds: Normal breath sounds.  Skin:    General: Skin is warm.  Neurological:     General: No focal deficit present.     Mental Status: She is alert.  Psychiatric:        Mood and Affect: Mood normal.        Behavior: Behavior normal.         Assessment And Plan:     1. Essential hypertension  Fair control. Pt is aware that optimal bp is less than 120/80.  I will add amlodipine 5mg  nightly to her medication regimen. She agrees to come in six weeks for f/u. I will check bloodwork at that time.   2. Bilateral carpal tunnel  syndrome  Chronic. She is encouraged to wear wrist splints at night. She will f/u with Hand specialist if needed.   3. Menorrhagia with regular cycle  Chronic. 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 She is encouraged to decrease intake of processed foods, stick to lean meats free of hormones, and to decrease her sugar intake. She may also benefit from progesterone. I will discuss this at her next visit.   4. OSA on CPAP  Chronic. She admits that she wears her CPAP at least six hours nightly and has noticed an improvement in her energy levels since starting CPAP.   5. Class 3 severe obesity due to excess calories with serious comorbidity and body mass index (BMI) of 50.0 to 59.9 in adult [facility])  Importance of achieving optimal weight to decrease risk of cardiovascular disease and cancers was discussed with the patient in full detail. She is encouraged to start slowly - start with 10 minutes twice daily at least three to four days per week and to gradually build to 30 minutes five days weekly. She was given tips to incorporate more activity into her daily routine - take stairs when possible, park farther away from her job, grocery stores, etc.       Maximino Greenland, MD    THE PATIENT IS ENCOURAGED TO PRACTICE SOCIAL DISTANCING DUE TO THE COVID-19 PANDEMIC.

## 2018-10-23 ENCOUNTER — Ambulatory Visit: Payer: Self-pay | Admitting: Dietician

## 2018-10-24 ENCOUNTER — Ambulatory Visit: Payer: BLUE CROSS/BLUE SHIELD | Admitting: Internal Medicine

## 2018-11-03 ENCOUNTER — Other Ambulatory Visit: Payer: Self-pay | Admitting: Internal Medicine

## 2018-11-05 DIAGNOSIS — G4733 Obstructive sleep apnea (adult) (pediatric): Secondary | ICD-10-CM | POA: Diagnosis not present

## 2018-11-09 ENCOUNTER — Ambulatory Visit: Payer: BLUE CROSS/BLUE SHIELD | Admitting: Internal Medicine

## 2018-11-20 ENCOUNTER — Encounter: Payer: Self-pay | Admitting: Internal Medicine

## 2018-11-20 ENCOUNTER — Ambulatory Visit: Payer: BC Managed Care – PPO | Admitting: Internal Medicine

## 2018-11-20 ENCOUNTER — Other Ambulatory Visit: Payer: Self-pay

## 2018-11-20 VITALS — BP 126/74 | HR 105 | Temp 98.5°F | Ht 62.4 in | Wt 331.6 lb

## 2018-11-20 DIAGNOSIS — R635 Abnormal weight gain: Secondary | ICD-10-CM | POA: Diagnosis not present

## 2018-11-20 DIAGNOSIS — I10 Essential (primary) hypertension: Secondary | ICD-10-CM | POA: Diagnosis not present

## 2018-11-20 DIAGNOSIS — J302 Other seasonal allergic rhinitis: Secondary | ICD-10-CM

## 2018-11-20 DIAGNOSIS — D500 Iron deficiency anemia secondary to blood loss (chronic): Secondary | ICD-10-CM

## 2018-11-20 DIAGNOSIS — Z6841 Body Mass Index (BMI) 40.0 and over, adult: Secondary | ICD-10-CM

## 2018-11-20 MED ORDER — AMLODIPINE BESYLATE 5 MG PO TABS: 5.0000 mg | ORAL_TABLET | Freq: Every day | ORAL | 1 refills | Status: DC

## 2018-11-20 NOTE — Progress Notes (Signed)
Subjective:     Patient ID: Mikayla Rios , female    DOB: 1985/09/22 , 33 y.o.   MRN: [id number]   Chief Complaint  Patient presents with  . Hypertension    HPI  She is here today for bp check. Amlodipine once daily was added to her regimen at her last visit. She has tolerated this medication without any issues. She has not reported any side effects since starting the medication.     Past Medical History:  Diagnosis Date  . Allergy    seasonal  . Blood infection ([place]) 1985  . Blood transfusion without reported diagnosis   . Hypertension      Family History  Problem Relation Age of Onset  . Cancer Mother        breast, and in remission  . Diabetes Mother   . Hypertension Mother   . Autism Son   . Heart disease Paternal Aunt   . Cancer Paternal Uncle        colon  . Diabetes Maternal Grandmother   . Hypertension Maternal Grandmother   . Diabetes Maternal Grandfather   . Kidney disease Maternal Grandfather   . Diabetes Paternal Grandmother   . Cancer Paternal Grandfather        colon  . Congestive Heart Failure Father   . Hypertension Father      Current Outpatient Medications:  .  amLODipine (NORVASC) 5 MG tablet, Take 1 tablet (5 mg total) by mouth daily., Disp: 90 tablet, Rfl: 1 .  ferrous sulfate 325 (65 FE) MG tablet, Take 325 mg by mouth 2 (two) times daily., Disp: , Rfl:  .  lisinopril-hydrochlorothiazide (ZESTORETIC) 20-12.5 MG tablet, Take 1 tablet by mouth daily., Disp: 90 tablet, Rfl: 1   Allergies  Allergen Reactions  . Shellfish Allergy Hives  . Penicillins Hives and Rash    Has patient had a PCN reaction causing immediate rash, facial/tongue/throat swelling, SOB or lightheadedness with hypotension: No Has patient had a PCN reaction causing severe rash involving mucus membranes or skin necrosis: hives Has patient had a PCN reaction that required hospitalization No Has patient had a PCN reaction occurring within the last 10 years: yes If all of the  above answers are "NO", then may proceed with Cephalosporin use.      Review of Systems  Constitutional: Negative.   HENT: Positive for postnasal drip and sneezing.        She would like to start a medication for allergies. Has h/o seasonal allergies.  Respiratory: Negative.   Cardiovascular: Negative.   Gastrointestinal: Negative.   Neurological: Negative.   Psychiatric/Behavioral: Negative.      Today's Vitals   11/20/18 1150  BP: 126/74  Pulse: (!) 105  Temp: 98.5 F (36.9 C)  TempSrc: Oral  Weight: (!) 331 lb 9.6 oz (150.4 kg)  Height: 5' 2.4" (1.585 m)   Body mass index is 59.88 kg/m.   Objective:  Physical Exam Vitals signs and nursing note reviewed.  Constitutional:      Appearance: Normal appearance. She is obese.  HENT:     Head: Normocephalic and atraumatic.  Cardiovascular:     Rate and Rhythm: Normal rate and regular rhythm.     Heart sounds: Normal heart sounds.  Pulmonary:     Effort: Pulmonary effort is normal.     Breath sounds: Normal breath sounds.  Skin:    General: Skin is warm.  Neurological:     General: No focal deficit present.  Mental Status: She is alert.  Psychiatric:        Mood and Affect: Mood normal.        Behavior: Behavior normal.         Assessment And Plan:     1. Essential hypertension, benign  Improved control. She will continue with current meds. She was given 90 day supply of amlodipine 5mg . She will rto in 4 months for a full physical examination.   2. Weight gain  She was made aware of her 10 pound weight gain. She is encouraged to avoid sugary beverages and processed foods. Also encouraged to start walking in her neighborhood, 30 minutes five days weekly.   3. Seasonal allergies  She was given sample of Xyzal 5mg  to take nightly. Pt advised this could make her drowsy. If this helps to relieve her symptoms, I will send in a rx to the pharmacy.   4. Class 3 severe obesity due to excess calories with serious  comorbidity and body mass index (BMI) of 50.0 to 59.9 in adult [facility])  She is encouraged to work up to 150 minutes of exercise per week.   5. Iron deficiency anemia due to chronic blood loss  I will check labs as below. I will make further recommendations - CBC no Diff - Iron and IBC ([id number]) - Ferritin        Maximino Greenland, MD    THE PATIENT IS ENCOURAGED TO PRACTICE SOCIAL DISTANCING DUE TO THE COVID-19 PANDEMIC.

## 2018-11-21 LAB — IRON AND TIBC
Iron Saturation: 10 % — ABNORMAL LOW (ref 15–55)
Iron: 37 ug/dL (ref 27–159)
Total Iron Binding Capacity: 372 ug/dL (ref 250–450)
UIBC: 335 ug/dL (ref 131–425)

## 2018-11-21 LAB — FERRITIN: Ferritin: 15 ng/mL (ref 15–150)

## 2018-11-21 LAB — CBC
Hematocrit: 33.9 % — ABNORMAL LOW (ref 34.0–46.6)
Hemoglobin: 10.5 g/dL — ABNORMAL LOW (ref 11.1–15.9)
MCH: 23.8 pg — ABNORMAL LOW (ref 26.6–33.0)
MCHC: 31 g/dL — ABNORMAL LOW (ref 31.5–35.7)
MCV: 77 fL — ABNORMAL LOW (ref 79–97)
NRBC: 1 % — ABNORMAL HIGH (ref 0–0)
Platelets: 568 10*3/uL — ABNORMAL HIGH (ref 150–450)
RBC: 4.42 x10E6/uL (ref 3.77–5.28)
RDW: 16.1 % — ABNORMAL HIGH (ref 11.7–15.4)
WBC: 6.6 10*3/uL (ref 3.4–10.8)

## 2018-11-27 ENCOUNTER — Encounter: Payer: Self-pay | Admitting: Family Medicine

## 2018-11-27 ENCOUNTER — Telehealth (INDEPENDENT_AMBULATORY_CARE_PROVIDER_SITE_OTHER): Payer: BC Managed Care – PPO | Admitting: Family Medicine

## 2018-11-27 DIAGNOSIS — G4733 Obstructive sleep apnea (adult) (pediatric): Secondary | ICD-10-CM | POA: Diagnosis not present

## 2018-11-27 DIAGNOSIS — Z9989 Dependence on other enabling machines and devices: Secondary | ICD-10-CM | POA: Diagnosis not present

## 2018-11-27 NOTE — Progress Notes (Signed)
PATIENT: Mikayla Rios DOB: 09/08/1985  REASON FOR VISIT: follow up HISTORY FROM: patient  Virtual Visit via Telephone Note  I connected with LASHAY OSBORNE on 11/27/18 at 11:00 AM EDT by telephone and verified that I am speaking with the correct person using two identifiers.   I discussed the limitations, risks, security and privacy concerns of performing an evaluation and management service by telephone and the availability of in person appointments. I also discussed with the patient that there may be a patient responsible charge related to this service. The patient expressed understanding and agreed to proceed.   History of Present Illness:  11/27/18 Mikayla Rios is a 33 y.o. female here today for follow up of OSA on CPAP. She reports that she is doing very well with CPAP therapy. She is noting benefit with sleep and feels better rested. She denies any concerns today. She does need new supplies.   10/27/2018 - 11/25/2018 11/25/2018 Usage days 29/30 days (97%) >= 4 hours 26 days (87%) < 4 hours 3 days (10%) Usage hours 168 hours 18 minutes Average usage (total days) 5 hours 37 minutes Average usage (days used) 5 hours 48 minutes Median usage (days used) 5 hours 34 minutes Total used hours (value since last reset - 11/25/2018) 350 hours AirSense 10 AutoSet Serial number [id number] Mode AutoSet Min Pressure 5 cmH2O Max Pressure 18 cmH2O EPR Fulltime EPR level 3 Response Soft Therapy Pressure - cmH2O Median: 5.7 95th percentile: 8.4 Maximum: 10.1 Leaks - L/min Median: 1.0 95th percentile: 15.4 Maximum: 52.1 Events per hour AI: 2.0 HI: 0.0 AHI: 2.0 Apnea Index Central: 0.0 Obstructive: 1.2 Unknown: 0.8 RERA Index 0.1 Cheyne-Stokes respiration (average duration per night) 0 minutes (0%)   History (copied from Dr Edwena Felty note on 07/31/2018)  HPI:  Mikayla Rios is a 33 y.o. female  Patient , seen on 07-31-2018 upon  Referral from Dr. Baird Cancer for a sleep consultation.    Chief complaint according to patient: " I don't snore unless very tired, don't sleep much, and I wake up frequently" Mrs. Feutz estimated her average sleep time 4-5 hours each night.  She reports having HTN, superobesity, pain in the left arm and shoulder. Has supposingly carpal tunnel on the left, but also pain in her upper arm, up to shoulder and axilla. She has a pending worker's comp case.   Sleep habits are as follows: Work ends for Mrs. Drotar at around 7 PM dinnertime at home will be around 8 PM.  She keeps an early bedtime after her children are in bed and usually by 930 will go to her bedroom as well.  She may not sleep until about 11 PM.  Mr. Line reports that his wife has been more restless at night.  She has tried to sleep more on her back in order to get some relief for shoulder and back, but she is actually more used to sleeping on her sides.  She uses 2 pillows for head support.  The bedroom is described as cool, quiet and dark.  She is using a humidifier also to ease her breathing. She wakes about 4 AM, has a bathroom break and often struggles to go to bed. Her husband's alarm rings at 4.15 . She may leave the bedroom and goes to couch.  She rises at 5.30 AM, the children's bus comes at 6.55 AM ( 76 , 37 and 35 years old).    Sleep medical history: sleep problems began many years ago: "  I cant remember when I had last a good nights rest" . She worked night shifts working 8 to 8, and  after that another schedule until 1 AM . 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 HTN, first evident with pre-ecclampsia in pregnancy.   Family sleep history: her father was recently diagnosed with CHF - 35 years old, had  SG and was positive for OSA.   Social history: office job, ona computer all day,  married, 3 biological children and a 51/38 /26 year old.   Never smoker, ETOH- social- none since November. Caffeine ; stopped in November.    Observations/Objective:  Generalized:  Well developed, in no acute distress  Mentation: Alert oriented to time, place, history taking. Follows all commands speech and language fluent   Assessment and Plan:  33 y.o. year old female  has a past medical history of Allergy, Blood infection ([place]) (1985), Blood transfusion without reported diagnosis, and Hypertension. here with    ICD-10-CM   1. OSA on CPAP  G47.33 For home use only DME continuous positive airway pressure (CPAP)   Z99.89    Ms. Luten continues to do well on CPAP therapy.  Compliance data shows optimal compliance.  She was encouraged to use her CPAP machine every night and for greater than 4 hours every night.  We will send an order today for new supplies.  She was advised annual follow-up.  She verbalizes understanding and agreement with this plan.  Orders Placed This Encounter  Procedures  . For home use only DME continuous positive airway pressure (CPAP)    Supplies please    Order Specific Question:   Length of Need    Answer:   Lifetime    Order Specific Question:   Patient has OSA or probable OSA    Answer:   Yes    Order Specific Question:   Settings    Answer:   Other see comments    Order Specific Question:   CPAP supplies needed    Answer:   Mask, headgear, cushions, filters, heated tubing and water chamber    No orders of the defined types were placed in this encounter.    Follow Up Instructions:  I discussed the assessment and treatment plan with the patient. The patient was provided an opportunity to ask questions and all were answered. The patient agreed with the plan and demonstrated an understanding of the instructions.   The patient was advised to call back or seek an in-person evaluation if the symptoms worsen or if the condition fails to improve as anticipated.  I provided 20 minutes of non-face-to-face time during this encounter.  Patient is located at her place of residence during video conference.  Provider is located at her place of  residence.  Maryelizabeth Kaufmann, CMA helped to facilitate visit.  Debbora Presto, NP

## 2018-12-05 DIAGNOSIS — G4733 Obstructive sleep apnea (adult) (pediatric): Secondary | ICD-10-CM | POA: Diagnosis not present

## 2019-02-09 DIAGNOSIS — G4733 Obstructive sleep apnea (adult) (pediatric): Secondary | ICD-10-CM | POA: Diagnosis not present

## 2019-03-22 ENCOUNTER — Encounter: Payer: BC Managed Care – PPO | Admitting: Internal Medicine

## 2019-04-18 ENCOUNTER — Telehealth: Payer: Self-pay

## 2019-04-18 NOTE — Telephone Encounter (Signed)
PT HERE TODAY WITH MOTHER AND PROVIDER ADVISED PT TO SCHEDULE APPT WITHIN 2+ WEEKS NOT ASAP APPT HAS BEEN RESCHEDULED TO 12/3.LVM FOR PT TO CALL TO RESCHEDULE IF APPT NOT CONVENIENT

## 2019-04-19 ENCOUNTER — Ambulatory Visit: Payer: Medicaid Other | Admitting: Internal Medicine

## 2019-05-10 ENCOUNTER — Ambulatory Visit: Payer: Medicaid Other | Admitting: Internal Medicine

## 2019-05-21 ENCOUNTER — Ambulatory Visit: Payer: BC Managed Care – PPO | Admitting: Internal Medicine

## 2019-06-13 ENCOUNTER — Other Ambulatory Visit: Payer: Self-pay | Admitting: Internal Medicine

## 2019-06-15 ENCOUNTER — Other Ambulatory Visit: Payer: Self-pay | Admitting: Internal Medicine

## 2019-06-24 ENCOUNTER — Other Ambulatory Visit: Payer: Self-pay | Admitting: Internal Medicine

## 2019-07-03 ENCOUNTER — Other Ambulatory Visit: Payer: Self-pay | Admitting: Internal Medicine

## 2019-07-03 ENCOUNTER — Encounter: Payer: Self-pay | Admitting: Internal Medicine

## 2019-07-04 ENCOUNTER — Other Ambulatory Visit: Payer: Self-pay

## 2019-07-04 ENCOUNTER — Ambulatory Visit: Payer: BC Managed Care – PPO | Admitting: Nurse Practitioner

## 2019-07-04 ENCOUNTER — Encounter: Payer: Self-pay | Admitting: Nurse Practitioner

## 2019-07-04 VITALS — BP 124/80 | HR 120 | Temp 97.8°F | Ht 63.0 in | Wt 341.4 lb

## 2019-07-04 DIAGNOSIS — R300 Dysuria: Secondary | ICD-10-CM | POA: Diagnosis not present

## 2019-07-04 DIAGNOSIS — Z8371 Family history of colonic polyps: Secondary | ICD-10-CM

## 2019-07-04 DIAGNOSIS — I10 Essential (primary) hypertension: Secondary | ICD-10-CM

## 2019-07-04 DIAGNOSIS — Z23 Encounter for immunization: Secondary | ICD-10-CM

## 2019-07-04 DIAGNOSIS — D508 Other iron deficiency anemias: Secondary | ICD-10-CM | POA: Diagnosis not present

## 2019-07-04 DIAGNOSIS — R3129 Other microscopic hematuria: Secondary | ICD-10-CM | POA: Diagnosis not present

## 2019-07-04 DIAGNOSIS — R000 Tachycardia, unspecified: Secondary | ICD-10-CM

## 2019-07-04 LAB — POCT URINALYSIS DIPSTICK
Bilirubin, UA: NEGATIVE
Glucose, UA: NEGATIVE
Ketones, UA: NEGATIVE
Nitrite, UA: NEGATIVE
Protein, UA: NEGATIVE
Spec Grav, UA: 1.025 (ref 1.010–1.025)
Urobilinogen, UA: 0.2 E.U./dL
pH, UA: 6 (ref 5.0–8.0)

## 2019-07-04 MED ORDER — TETANUS-DIPHTH-ACELL PERTUSSIS 5-2.5-18.5 LF-MCG/0.5 IM SUSP
0.5000 mL | Freq: Once | INTRAMUSCULAR | Status: AC
  Administered 2019-07-04: 0.5 mL via INTRAMUSCULAR

## 2019-07-04 MED ORDER — LISINOPRIL-HYDROCHLOROTHIAZIDE 20-12.5 MG PO TABS: 1.0000 | ORAL_TABLET | Freq: Every day | ORAL | 1 refills | Status: DC

## 2019-07-04 MED ORDER — METOPROLOL SUCCINATE ER 25 MG PO TB24: 25.0000 mg | ORAL_TABLET | Freq: Every day | ORAL | 2 refills | Status: DC

## 2019-07-04 NOTE — Progress Notes (Signed)
This visit occurred during the SARS-CoV-2 public health emergency.  Safety protocols were in place, including screening questions prior to the visit, additional usage of staff PPE, and extensive cleaning of exam room while observing appropriate contact time as indicated for disinfecting solutions.  Subjective:     Patient ID: Mikayla Rios , female    DOB: 1986/03/14 , 34 y.o.   MRN: [id number]   Chief Complaint  Patient presents with  . Hypertension    HPI  Wt Readings from Last 3 Encounters: 07/04/19 : (!) 341 lb 6.4 oz (154.9 kg) 11/20/18 : (!) 331 lb 9.6 oz (150.4 kg) 10/12/18 : (!) 321 lb 9.6 oz (145.9 kg)  She is trying to do a vegan type diet by removing meats.  Increased vegetables.  Increased water. Will also drink cranberry juice.  She has had a 10 lb weight gain since June 2020.   Hypertension This is a chronic problem. The current episode started more than 1 year ago. The problem is controlled. Pertinent negatives include no anxiety or headaches. There are no associated agents to hypertension. Risk factors for coronary artery disease include obesity and sedentary lifestyle. Past treatments include calcium channel blockers, diuretics and ACE inhibitors. There are no compliance problems.  There is no history of angina. There is no history of chronic renal disease.  Dysuria  This is a new problem. The current episode started in the past 7 days. The problem occurs intermittently. The problem has been unchanged. She is not sexually active. There is no history of pyelonephritis. Associated symptoms include frequency. Pertinent negatives include no chills or urgency. She has tried increased fluids (cranberry tabs) for the symptoms.     Past Medical History:  Diagnosis Date  . Allergy    seasonal  . Blood infection ([place]) 1985  . Blood transfusion without reported diagnosis   . Hypertension      Family History  Problem Relation Age of Onset  . Cancer Mother        breast,  and in remission  . Diabetes Mother   . Hypertension Mother   . Autism Son   . Heart disease Paternal Aunt   . Cancer Paternal Uncle        colon  . Diabetes Maternal Grandmother   . Hypertension Maternal Grandmother   . Diabetes Maternal Grandfather   . Kidney disease Maternal Grandfather   . Diabetes Paternal Grandmother   . Cancer Paternal Grandfather        colon  . Congestive Heart Failure Father   . Hypertension Father      Current Outpatient Medications:  .  amLODipine (NORVASC) 5 MG tablet, TAKE 1 TABLET BY MOUTH EVERY DAY, Disp: 90 tablet, Rfl: 1 .  lisinopril-hydrochlorothiazide (ZESTORETIC) 20-12.5 MG tablet, Take 1 tablet by mouth daily., Disp: 90 tablet, Rfl: 1 .  ferrous sulfate 325 (65 FE) MG tablet, Take 325 mg by mouth 2 (two) times daily., Disp: , Rfl:    Allergies  Allergen Reactions  . Shellfish Allergy Hives  . Penicillins Hives and Rash    Has patient had a PCN reaction causing immediate rash, facial/tongue/throat swelling, SOB or lightheadedness with hypotension: No Has patient had a PCN reaction causing severe rash involving mucus membranes or skin necrosis: hives Has patient had a PCN reaction that required hospitalization No Has patient had a PCN reaction occurring within the last 10 years: yes If all of the above answers are "NO", then may proceed with Cephalosporin use.  Review of Systems  Constitutional: Negative for chills and fatigue.  Genitourinary: Positive for dysuria and frequency. Negative for urgency.  Neurological: Negative.  Negative for dizziness and headaches.  Psychiatric/Behavioral: Negative.      Today's Vitals   07/04/19 1628  BP: 124/80  Pulse: (!) 120  Temp: 97.8 F (36.6 C)  TempSrc: Oral  Weight: (!) 341 lb 6.4 oz (154.9 kg)  Height: _0  (1.6 m)  PainSc: 0-No pain   Body mass index is 60.48 kg/m.   Objective:  Physical Exam Constitutional:      Appearance: Normal appearance.  Cardiovascular:     Rate  and Rhythm: Tachycardia present.     Pulses: Normal pulses.     Heart sounds: Normal heart sounds. No murmur.  Pulmonary:     Effort: Pulmonary effort is normal. No respiratory distress.     Breath sounds: Normal breath sounds.  Skin:    Capillary Refill: Capillary refill takes less than 2 seconds.  Neurological:     General: No focal deficit present.     Mental Status: She is alert and oriented to person, place, and time.  Psychiatric:        Mood and Affect: Mood normal.        Thought Content: Thought content normal.        Judgment: Judgment normal.         Assessment And Plan:     1. Essential hypertension, benign  Chronic, good control  Continue with current medications. - lisinopril-hydrochlorothiazide (ZESTORETIC) 20-12.5 MG tablet; Take 1 tablet by mouth daily.  Dispense: 90 tablet; Refill: 1 - Lipid panel - CMP14+EGFR  2. Tachycardia  Heart rate up to 150 on EKG  Will start on metoprolol XL and have her to follow up in 1 week.    Asymptomatic.  She must return in one week for follow up - CBC - TSH - EKG 12-Lead - metoprolol succinate (TOPROL XL) 25 MG 24 hr tablet; Take 1 tablet (25 mg total) by mouth daily.  Dispense: 30 tablet; Refill: 2  3. Other iron deficiency anemia  History of iron deficiency anemia, will check to see if this is the cause for her elevated heart rate.  - Iron, TIBC and Ferritin Panel  4. Dysuria  Will send for urine culture negative leukocytes - POCT Urinalysis Dipstick ([id number])  5. Other microscopic hematuria   - Culture, Urine  6. Family history of FAP (familial adenomatous polyposis)  Per patient she was advised by the genetics office her mother went to, to have genetic testing to see if she has the gene for this disease. - Ambulatory referral to Genetics  7. Encounter for immunization  Will give tetanus vaccine today while in office. Refer to order management. TDAP will be administered to adults 26-2 years old  every 10 years. - Tdap (BOOSTRIX) injection 0.5 mL   Minette Brine, FNP    THE PATIENT IS ENCOURAGED TO PRACTICE SOCIAL DISTANCING DUE TO THE COVID-19 PANDEMIC.

## 2019-07-05 ENCOUNTER — Encounter: Payer: Self-pay | Admitting: Internal Medicine

## 2019-07-05 LAB — URINE CULTURE

## 2019-07-09 ENCOUNTER — Other Ambulatory Visit: Payer: Self-pay

## 2019-07-11 ENCOUNTER — Other Ambulatory Visit: Payer: Self-pay

## 2019-07-11 ENCOUNTER — Encounter: Payer: Self-pay | Admitting: Nurse Practitioner

## 2019-07-11 ENCOUNTER — Ambulatory Visit: Payer: BC Managed Care – PPO | Admitting: Nurse Practitioner

## 2019-07-11 VITALS — BP 124/80 | HR 126 | Temp 98.4°F | Ht 62.6 in | Wt 341.0 lb

## 2019-07-11 DIAGNOSIS — D508 Other iron deficiency anemias: Secondary | ICD-10-CM

## 2019-07-11 DIAGNOSIS — I10 Essential (primary) hypertension: Secondary | ICD-10-CM | POA: Diagnosis not present

## 2019-07-11 DIAGNOSIS — Z6841 Body Mass Index (BMI) 40.0 and over, adult: Secondary | ICD-10-CM

## 2019-07-11 DIAGNOSIS — R000 Tachycardia, unspecified: Secondary | ICD-10-CM | POA: Insufficient documentation

## 2019-07-11 DIAGNOSIS — D649 Anemia, unspecified: Secondary | ICD-10-CM | POA: Insufficient documentation

## 2019-07-11 NOTE — Progress Notes (Signed)
This visit occurred during the SARS-CoV-2 public health emergency.  Safety protocols were in place, including screening questions prior to the visit, additional usage of staff PPE, and extensive cleaning of exam room while observing appropriate contact time as indicated for disinfecting solutions.  Subjective:     Patient ID: Mikayla Rios , female    DOB: Nov 09, 1985 , 34 y.o.   MRN: [id number]   Chief Complaint  Patient presents with  . Palpitations    HPI  Initially when she started the metoprolol she can tell a difference, her pulse at home was 80-93 at home.  She did not feel a lump in her throat after taking the medications.   Palpitations  Pertinent negatives include no chest pain or dizziness.     Past Medical History:  Diagnosis Date  . Allergy    seasonal  . Blood infection ([place]) 1985  . Blood transfusion without reported diagnosis   . Hypertension      Family History  Problem Relation Age of Onset  . Cancer Mother        breast, and in remission  . Diabetes Mother   . Hypertension Mother   . Autism Son   . Heart disease Paternal Aunt   . Cancer Paternal Uncle        colon  . Diabetes Maternal Grandmother   . Hypertension Maternal Grandmother   . Diabetes Maternal Grandfather   . Kidney disease Maternal Grandfather   . Diabetes Paternal Grandmother   . Cancer Paternal Grandfather        colon  . Congestive Heart Failure Father   . Hypertension Father      Current Outpatient Medications:  .  amLODipine (NORVASC) 5 MG tablet, TAKE 1 TABLET BY MOUTH EVERY DAY, Disp: 90 tablet, Rfl: 1 .  ferrous sulfate 325 (65 FE) MG tablet, Take 325 mg by mouth 2 (two) times daily., Disp: , Rfl:  .  lisinopril-hydrochlorothiazide (ZESTORETIC) 20-12.5 MG tablet, Take 1 tablet by mouth daily., Disp: 90 tablet, Rfl: 1 .  metoprolol succinate (TOPROL XL) 25 MG 24 hr tablet, Take 1 tablet (25 mg total) by mouth daily., Disp: 30 tablet, Rfl: 2   Allergies  Allergen Reactions   . Shellfish Allergy Hives  . Penicillins Hives and Rash    Has patient had a PCN reaction causing immediate rash, facial/tongue/throat swelling, SOB or lightheadedness with hypotension: No Has patient had a PCN reaction causing severe rash involving mucus membranes or skin necrosis: hives Has patient had a PCN reaction that required hospitalization No Has patient had a PCN reaction occurring within the last 10 years: yes If all of the above answers are "NO", then may proceed with Cephalosporin use.      Review of Systems  Constitutional: Negative for fatigue.  Respiratory: Negative.   Cardiovascular: Positive for palpitations. Negative for chest pain and leg swelling.  Neurological: Negative for dizziness and headaches.  Psychiatric/Behavioral: Negative.        She is under increased stress.      Today's Vitals   07/11/19 1606  BP: 124/80  Pulse: (!) 126  Temp: 98.4 F (36.9 C)  TempSrc: Oral  Weight: (!) 341 lb (154.7 kg)  Height: 5' 2.6" (1.59 m)  PainSc: 0-No pain   Body mass index is 61.18 kg/m.   Objective:  Physical Exam Constitutional:      Appearance: Normal appearance.  Cardiovascular:     Rate and Rhythm: Normal rate and regular rhythm.  Pulses: Normal pulses.     Heart sounds: Normal heart sounds.  Pulmonary:     Effort: Pulmonary effort is normal. No respiratory distress.     Breath sounds: Normal breath sounds.  Neurological:     Mental Status: She is alert.         Assessment And Plan:  1. Tachycardia Continues to be elevated however she reports improved when she was at home. At her last visit she did not have her labs done, will have done today. Pending results of iron and cbc will determine if needs to increase her medications or refer to cardiology.   2. Other iron deficiency anemia  Will check levels and see if this is the cause of her tachycardia.   3. Class 3 severe obesity due to excess calories with serious comorbidity and body  mass index (BMI) of 60.0 to 69.9 in adult [facility])  She is eating a vegan diet  Currently under increased stress due to her mother having cancer.  Minette Brine, FNP    THE PATIENT IS ENCOURAGED TO PRACTICE SOCIAL DISTANCING DUE TO THE COVID-19 PANDEMIC.

## 2019-07-12 LAB — CMP14+EGFR
ALT: 13 IU/L (ref 0–32)
AST: 15 IU/L (ref 0–40)
Albumin/Globulin Ratio: 1.6 (ref 1.2–2.2)
Albumin: 4.6 g/dL (ref 3.8–4.8)
Alkaline Phosphatase: 42 IU/L (ref 39–117)
BUN/Creatinine Ratio: 11 (ref 9–23)
BUN: 7 mg/dL (ref 6–20)
Bilirubin Total: 0.2 mg/dL (ref 0.0–1.2)
CO2: 22 mmol/L (ref 20–29)
Calcium: 9.2 mg/dL (ref 8.7–10.2)
Chloride: 100 mmol/L (ref 96–106)
Creatinine, Ser: 0.66 mg/dL (ref 0.57–1.00)
GFR calc Af Amer: 132 mL/min/{1.73_m2} (ref >59)
GFR calc non Af Amer: 115 mL/min/{1.73_m2} (ref >59)
Globulin, Total: 2.8 g/dL (ref 1.5–4.5)
Glucose: 88 mg/dL (ref 65–99)
Potassium: 3.9 mmol/L (ref 3.5–5.2)
Sodium: 136 mmol/L (ref 134–144)
Total Protein: 7.4 g/dL (ref 6.0–8.5)

## 2019-07-12 LAB — TSH: TSH: 1.39 u[IU]/mL (ref 0.450–4.500)

## 2019-07-12 LAB — CBC
Hematocrit: 37.1 % (ref 34.0–46.6)
Hemoglobin: 11.9 g/dL (ref 11.1–15.9)
MCH: 25.9 pg — ABNORMAL LOW (ref 26.6–33.0)
MCHC: 32.1 g/dL (ref 31.5–35.7)
MCV: 81 fL (ref 79–97)
Platelets: 530 10*3/uL — ABNORMAL HIGH (ref 150–450)
RBC: 4.59 x10E6/uL (ref 3.77–5.28)
RDW: 14.5 % (ref 11.7–15.4)
WBC: 7.5 10*3/uL (ref 3.4–10.8)

## 2019-07-12 LAB — LIPID PANEL
Chol/HDL Ratio: 3.8 ratio (ref 0.0–4.4)
Cholesterol, Total: 161 mg/dL (ref 100–199)
HDL: 42 mg/dL (ref >39)
LDL Chol Calc (NIH): 105 mg/dL — ABNORMAL HIGH (ref 0–99)
Triglycerides: 72 mg/dL (ref 0–149)
VLDL Cholesterol Cal: 14 mg/dL (ref 5–40)

## 2019-07-12 LAB — IRON,TIBC AND FERRITIN PANEL
Ferritin: 30 ng/mL (ref 15–150)
Iron Saturation: 15 % (ref 15–55)
Iron: 52 ug/dL (ref 27–159)
Total Iron Binding Capacity: 342 ug/dL (ref 250–450)
UIBC: 290 ug/dL (ref 131–425)

## 2019-07-12 MED ORDER — METOPROLOL SUCCINATE ER 50 MG PO TB24: 50.0000 mg | ORAL_TABLET | Freq: Every day | ORAL | 2 refills | Status: DC

## 2019-07-16 ENCOUNTER — Encounter: Payer: Self-pay | Admitting: Nurse Practitioner

## 2019-07-19 ENCOUNTER — Other Ambulatory Visit: Payer: Self-pay

## 2019-07-19 ENCOUNTER — Encounter: Payer: Self-pay | Admitting: Internal Medicine

## 2019-07-19 ENCOUNTER — Ambulatory Visit: Payer: BC Managed Care – PPO | Admitting: Internal Medicine

## 2019-07-19 VITALS — BP 132/86 | HR 100 | Temp 98.6°F | Ht 62.6 in | Wt 341.2 lb

## 2019-07-19 DIAGNOSIS — Z6841 Body Mass Index (BMI) 40.0 and over, adult: Secondary | ICD-10-CM | POA: Diagnosis not present

## 2019-07-19 DIAGNOSIS — R002 Palpitations: Secondary | ICD-10-CM | POA: Diagnosis not present

## 2019-07-19 DIAGNOSIS — I10 Essential (primary) hypertension: Secondary | ICD-10-CM

## 2019-07-19 DIAGNOSIS — Z8371 Family history of colonic polyps: Secondary | ICD-10-CM

## 2019-07-19 NOTE — Patient Instructions (Signed)
Palpitations Palpitations are feelings that your heartbeat is not normal. Your heartbeat may feel like it is:  Uneven.  Faster than normal.  Fluttering.  Skipping a beat. This is usually not a serious problem. In some cases, you may need tests to rule out any serious problems. Follow these instructions at home: Pay attention to any changes in your condition. Take these actions to help manage your symptoms: Eating and drinking  Avoid: ? Coffee, tea, soft drinks, and energy drinks. ? Chocolate. ? Alcohol. ? Diet pills. Lifestyle   Try to lower your stress. These things can help you relax: ? Yoga. ? Deep breathing and meditation. ? Exercise. ? Using words and images to create positive thoughts (guided imagery). ? Using your mind to control things in your body (biofeedback).  Do not use drugs.  Get plenty of rest and sleep. Keep a regular bed time. General instructions   Take over-the-counter and prescription medicines only as told by your doctor.  Do not use any products that contain nicotine or tobacco, such as cigarettes and e-cigarettes. If you need help quitting, ask your doctor.  Keep all follow-up visits as told by your doctor. This is important. You may need more tests if palpitations do not go away or get worse. Contact a doctor if:  Your symptoms last more than 24 hours.  Your symptoms occur more often. Get help right away if you:  Have chest pain.  Feel short of breath.  Have a very bad headache.  Feel dizzy.  Pass out (faint). Summary  Palpitations are feelings that your heartbeat is uneven or faster than normal. It may feel like your heart is fluttering or skipping a beat.  Avoid food and drinks that may cause palpitations. These include caffeine, chocolate, and alcohol.  Try to lower your stress. Do not smoke or use drugs.  Get help right away if you faint or have chest pain, shortness of breath, a severe headache, or dizziness. This  information is not intended to replace advice given to you by your health care provider. Make sure you discuss any questions you have with your health care provider. Document Revised: 07/06/2017 Document Reviewed: 07/06/2017 Elsevier Patient Education  2020 [employer].  

## 2019-07-20 ENCOUNTER — Encounter: Payer: Self-pay | Admitting: Internal Medicine

## 2019-07-20 NOTE — Progress Notes (Signed)
This visit occurred during the SARS-CoV-2 public health emergency.  Safety protocols were in place, including screening questions prior to the visit, additional usage of staff PPE, and extensive cleaning of exam room while observing appropriate contact time as indicated for disinfecting solutions.  Subjective:     Patient ID: Mikayla Rios , female    DOB: [birth year] , 34 y.o.   MRN: [id number]   Chief Complaint  Patient presents with  . Palpitations    f/u    HPI  She is here today for f/u palpitations. She was seen by Dr. Doreene Burke, NP at her last visit who started her on metoprolol. She reports she feels much better, but has been feeling bad shortly after taking metoprolol. She reports feeling extremely fatigued after taking it. She reports that she has less sx of her heart racing since starting the metoprolol.   Palpitations  This is a recurrent problem. The current episode started 1 to 4 weeks ago. The problem occurs intermittently.     Past Medical History:  Diagnosis Date  . Allergy    seasonal  . Blood infection ([place]) 1985  . Blood transfusion without reported diagnosis   . Hypertension      Family History  Problem Relation Age of Onset  . Cancer Mother        breast, and in remission  . Diabetes Mother   . Hypertension Mother   . Autism Son   . Heart disease Paternal Aunt   . Cancer Paternal Uncle        colon  . Diabetes Maternal Grandmother   . Hypertension Maternal Grandmother   . Diabetes Maternal Grandfather   . Kidney disease Maternal Grandfather   . Diabetes Paternal Grandmother   . Cancer Paternal Grandfather        colon  . Congestive Heart Failure Father   . Hypertension Father      Current Outpatient Medications:  .  amLODipine (NORVASC) 5 MG tablet, TAKE 1 TABLET BY MOUTH EVERY DAY (Patient taking differently: 1/2 tab), Disp: 90 tablet, Rfl: 1 .  ferrous sulfate 325 (65 FE) MG tablet, Take 325 mg by mouth 2 (two) times daily. 1 per day, Disp: ,  Rfl:  .  lisinopril-hydrochlorothiazide (ZESTORETIC) 20-12.5 MG tablet, Take 1 tablet by mouth daily., Disp: 90 tablet, Rfl: 1 .  metoprolol succinate (TOPROL XL) 50 MG 24 hr tablet, Take 1 tablet (50 mg total) by mouth daily., Disp: 30 tablet, Rfl: 2   Allergies  Allergen Reactions  . Shellfish Allergy Hives  . Penicillins Hives and Rash    Has patient had a PCN reaction causing immediate rash, facial/tongue/throat swelling, SOB or lightheadedness with hypotension: No Has patient had a PCN reaction causing severe rash involving mucus membranes or skin necrosis: hives Has patient had a PCN reaction that required hospitalization No Has patient had a PCN reaction occurring within the last 10 years: yes If all of the above answers are "NO", then may proceed with Cephalosporin use.      Review of Systems  Constitutional: Negative.   Respiratory: Negative.   Cardiovascular: Positive for palpitations.  Gastrointestinal: Negative.   Neurological: Negative.   Psychiatric/Behavioral: Negative.      Today's Vitals   07/19/19 1611  BP: 132/86  Pulse: 100  Temp: 98.6 F (37 C)  TempSrc: Oral  Weight: (!) 341 lb 3.2 oz (154.8 kg)  Height: 5' 2.6" (1.59 m)   Body mass index is 61.22 kg/m.   Objective:  Physical Exam Vitals and nursing note reviewed.  Constitutional:      Appearance: Normal appearance. She is obese.  HENT:     Head: Normocephalic and atraumatic.  Cardiovascular:     Rate and Rhythm: Regular rhythm. Tachycardia present.     Heart sounds: Normal heart sounds.  Pulmonary:     Effort: Pulmonary effort is normal.     Breath sounds: Normal breath sounds.  Skin:    General: Skin is warm.  Neurological:     General: No focal deficit present.     Mental Status: She is alert.  Psychiatric:        Mood and Affect: Mood normal.        Behavior: Behavior normal.         Assessment And Plan:     1. Palpitations  Improved, yet not resolved. She is encouraged to  aim for at least 80-100 ounces of water daily. Also advised to start magnesium nightly, 250-400mg  nightly. She will continue with metoprolol, yet she will start to take 1/2 tab twice daily. Advised to pick up pill cutter from the pharmacy.   2. Essential hypertension, benign  Chronic, controlled. She will continue with current meds. She is encouraged to avoid adding salt to her foods.   3. Class 3 severe obesity due to excess calories with serious comorbidity and body mass index (BMI) of 60.0 to 69.9 in adult [facility])  She reports starting to walk more, she takes her kids with her. She was congratulated on making this lifestyle change and encouraged tokeep up the great work. Her initial goal is to strive for BMI less than 50 to decrease cardiac risk.   4. Family history of FAP (familial adenomatous polyposis)  She has yet to have colonoscopy. She reports being advised to undergo genetic counseling first. She has yet to receive phone call, so I will speak to referral coordinator to check on referral status.   Maximino Greenland, MD    THE PATIENT IS ENCOURAGED TO PRACTICE SOCIAL DISTANCING DUE TO THE COVID-19 PANDEMIC.

## 2019-08-14 ENCOUNTER — Other Ambulatory Visit: Payer: Self-pay

## 2019-08-14 ENCOUNTER — Ambulatory Visit: Payer: BC Managed Care – PPO

## 2019-08-14 VITALS — BP 154/86 | HR 115 | Temp 98.3°F | Ht 62.0 in | Wt 343.2 lb

## 2019-08-14 DIAGNOSIS — I10 Essential (primary) hypertension: Secondary | ICD-10-CM

## 2019-08-14 NOTE — Progress Notes (Signed)
BP is running 154/86 pulse is 115 , she has been drinking her water she has changed water she states she found out Dasani has salt in it and she feels as if that has been a problem , she is taking deep breaths now to calm her pulse down. She states she is taking medicines correctly. Per Baltazar Apo a referral to cardiology will be placed.

## 2019-08-20 DIAGNOSIS — G4733 Obstructive sleep apnea (adult) (pediatric): Secondary | ICD-10-CM | POA: Diagnosis not present

## 2019-09-25 ENCOUNTER — Other Ambulatory Visit: Payer: Self-pay | Admitting: Nurse Practitioner

## 2019-09-25 DIAGNOSIS — R000 Tachycardia, unspecified: Secondary | ICD-10-CM

## 2019-10-02 ENCOUNTER — Encounter: Payer: Self-pay | Admitting: Nurse Practitioner

## 2019-10-02 ENCOUNTER — Ambulatory Visit: Payer: BC Managed Care – PPO | Admitting: Nurse Practitioner

## 2019-10-02 ENCOUNTER — Ambulatory Visit: Payer: Self-pay | Admitting: Nurse Practitioner

## 2019-10-02 ENCOUNTER — Other Ambulatory Visit: Payer: Self-pay

## 2019-10-02 VITALS — BP 148/86 | HR 115 | Temp 97.6°F | Ht 62.0 in | Wt 340.4 lb

## 2019-10-02 DIAGNOSIS — I10 Essential (primary) hypertension: Secondary | ICD-10-CM

## 2019-10-02 DIAGNOSIS — Z0001 Encounter for general adult medical examination with abnormal findings: Secondary | ICD-10-CM

## 2019-10-02 DIAGNOSIS — R000 Tachycardia, unspecified: Secondary | ICD-10-CM

## 2019-10-02 DIAGNOSIS — Z6841 Body Mass Index (BMI) 40.0 and over, adult: Secondary | ICD-10-CM

## 2019-10-02 DIAGNOSIS — M79641 Pain in right hand: Secondary | ICD-10-CM

## 2019-10-02 DIAGNOSIS — J302 Other seasonal allergic rhinitis: Secondary | ICD-10-CM

## 2019-10-02 LAB — POCT UA - MICROALBUMIN
Albumin/Creatinine Ratio, Urine, POC: 30
Creatinine, POC: 300 mg/dL
Microalbumin Ur, POC: 30 mg/L

## 2019-10-02 NOTE — Progress Notes (Signed)
Patient ID: Mikayla Rios, female   DOB: 1986/01/21, 34 y.o.   MRN: [id number]  This visit occurred during the SARS-CoV-2 public health emergency.  Safety protocols were in place, including screening questions prior to the visit, additional usage of staff PPE, and extensive cleaning of exam room while observing appropriate contact time as indicated for disinfecting solutions.  Subjective:     Patient ID: Mikayla Rios , female    DOB: Nov 03, 1985 , 34 y.o.   MRN: [id number]   Chief Complaint  Patient presents with  . Annual Exam  . Allergies    HPI  Here for HM  Hypertension This is a chronic problem. The current episode started more than 1 year ago. The problem is controlled. Pertinent negatives include no anxiety or headaches. There are no associated agents to hypertension. Risk factors for coronary artery disease include obesity and sedentary lifestyle. Past treatments include calcium channel blockers, diuretics and ACE inhibitors. There are no compliance problems.  There is no history of angina. There is no history of chronic renal disease.     The patient states she uses none for birth control. Last LMP was Patient's last menstrual period was 09/22/2019.. Negative for Dysmenorrhea and Negative for Menorrhagia she is due for a PAP with Dr. Willis Modena.  Negative for: breast discharge, breast lump(s), breast pain and breast self exam.  Pertinent negatives include abnormal bleeding (hematology), anxiety, decreased libido, depression, difficulty falling sleep, dyspareunia, history of infertility, nocturia, sexual dysfunction, sleep disturbances, urinary incontinence, urinary urgency, vaginal discharge and vaginal itching. Diet regular; she was not eating meats, increasing water and cutting back on juice and sugars. The patient states her exercise level is walking intermittently  The patient's tobacco use is:  Social History   Tobacco Use  Smoking Status Never Smoker  Smokeless Tobacco Never  Used   She has been exposed to passive smoke. The patient's alcohol use is:  Social History   Substance and Sexual Activity  Alcohol Use Not Currently   Additional information: Last pap due now will schedule an appt.   Past Medical History:  Diagnosis Date  . Allergy    seasonal  . Blood infection ([place]) 1985  . Blood transfusion without reported diagnosis   . Hypertension      Family History  Problem Relation Age of Onset  . Cancer Mother        breast, and in remission  . Diabetes Mother   . Hypertension Mother   . Autism Son   . Heart disease Paternal Aunt   . Cancer Paternal Uncle        colon  . Diabetes Maternal Grandmother   . Hypertension Maternal Grandmother   . Diabetes Maternal Grandfather   . Kidney disease Maternal Grandfather   . Diabetes Paternal Grandmother   . Cancer Paternal Grandfather        colon  . Congestive Heart Failure Father   . Hypertension Father      Current Outpatient Medications:  .  amLODipine (NORVASC) 5 MG tablet, TAKE 1 TABLET BY MOUTH EVERY DAY (Patient taking differently: 1/2 tab), Disp: 90 tablet, Rfl: 1 .  ferrous sulfate 325 (65 FE) MG tablet, Take 325 mg by mouth 2 (two) times daily. 1 per day, Disp: , Rfl:  .  lisinopril-hydrochlorothiazide (ZESTORETIC) 20-12.5 MG tablet, Take 1 tablet by mouth daily., Disp: 90 tablet, Rfl: 1 .  metoprolol succinate (TOPROL XL) 50 MG 24 hr tablet, Take 1 tablet (50 mg total) by mouth  daily. (Patient taking differently: Take 25 mg by mouth 2 (two) times daily. ), Disp: 30 tablet, Rfl: 2   Allergies  Allergen Reactions  . Shellfish Allergy Hives  . Penicillins Hives and Rash    Has patient had a PCN reaction causing immediate rash, facial/tongue/throat swelling, SOB or lightheadedness with hypotension: No Has patient had a PCN reaction causing severe rash involving mucus membranes or skin necrosis: hives Has patient had a PCN reaction that required hospitalization No Has patient had a PCN  reaction occurring within the last 10 years: yes If all of the above answers are "NO", then may proceed with Cephalosporin use.      Review of Systems  Neurological: Negative for headaches.     Today's Vitals   10/02/19 0938  BP: (!) 148/86  Pulse: (!) 115  Temp: 97.6 F (36.4 C)  TempSrc: Oral  Weight: (!) 340 lb 6.4 oz (154.4 kg)  Height: 5\' 2"  (1.575 m)   Body mass index is 62.26 kg/m.   Objective:  Physical Exam Constitutional:      General: She is not in acute distress.    Appearance: Normal appearance. She is well-developed. She is obese.  HENT:     Head: Atraumatic.     Right Ear: Hearing, tympanic membrane, ear canal and external ear normal. There is no impacted cerumen.     Left Ear: Hearing, tympanic membrane, ear canal and external ear normal. There is no impacted cerumen.     Nose:     Comments: Deferred - masked    Mouth/Throat:     Comments: Deferred - masked Eyes:     General: Lids are normal.     Conjunctiva/sclera: Conjunctivae normal.     Pupils: Pupils are equal, round, and reactive to light.     Funduscopic exam:    Right eye: No papilledema.        Left eye: No papilledema.  Neck:     Thyroid: No thyroid mass.     Vascular: No carotid bruit.  Cardiovascular:     Rate and Rhythm: Normal rate and regular rhythm.     Pulses: Normal pulses.     Heart sounds: Normal heart sounds. No murmur.  Pulmonary:     Effort: Pulmonary effort is normal. No respiratory distress.     Breath sounds: Normal breath sounds.  Abdominal:     General: Abdomen is flat. Bowel sounds are normal.     Palpations: Abdomen is soft.  Musculoskeletal:        General: No swelling. Normal range of motion.     Cervical back: Full passive range of motion without pain, normal range of motion and neck supple.     Right lower leg: No edema.     Left lower leg: No edema.  Skin:    General: Skin is warm and dry.     Capillary Refill: Capillary refill takes less than 2 seconds.   Neurological:     General: No focal deficit present.     Mental Status: She is alert and oriented to person, place, and time.     Cranial Nerves: No cranial nerve deficit.     Sensory: No sensory deficit.  Psychiatric:        Mood and Affect: Mood normal.        Behavior: Behavior normal.        Thought Content: Thought content normal.        Judgment: Judgment normal.  Assessment And Plan:      1. Essential hypertension  Chronic, slightly elevated today  Continue with current medications. - POCT UA - Microalbumin  2. Class 3 severe obesity due to excess calories with serious comorbidity and body mass index (BMI) of 50.0 to 59.9 in adult [facility])  She has lost 3 lbs since her last office visit  At this time unable to start any weight loss medications due to concern about her heart rate.   Encouraged to continue to eat a healthy diet and walking  Discussed the risks of covid 19 and advised if she would like the vaccine to make [place] aware.  - TSH  3. Encounter for general adult medical examination with abnormal findings . Behavior modifications discussed and diet history reviewed.   . Pt will continue to exercise regularly and modify diet with low GI, plant based foods and decrease intake of processed foods.  . Recommend intake of daily multivitamin, Vitamin D, and calcium.  . Recommend for preventive screenings, as well as recommend immunizations that include influenza, TDAP - Basic metabolic panel - CBC - Hemoglobin A1c - Lipid panel - TSH  4. Tachycardia  Continues to have an elevated heart rate  I will refer to cardiology for further evaluation - Ambulatory referral to Cardiology  5. Seasonal allergies  Advised to take xyzal at bedtime daily, continue with nasal spray and [place].    Minette Brine, FNP    THE PATIENT IS ENCOURAGED TO PRACTICE SOCIAL DISTANCING DUE TO THE COVID-19 PANDEMIC.

## 2019-10-02 NOTE — Patient Instructions (Signed)
Health Maintenance, Female Adopting a healthy lifestyle and getting preventive care are important in promoting health and wellness. Ask your health care provider about:  The right schedule for you to have regular tests and exams.  Things you can do on your own to prevent diseases and keep yourself healthy. What should I know about diet, weight, and exercise? Eat a healthy diet   Eat a diet that includes plenty of vegetables, fruits, low-fat dairy products, and lean protein.  Do not eat a lot of foods that are high in solid fats, added sugars, or sodium. Maintain a healthy weight Body mass index (BMI) is used to identify weight problems. It estimates body fat based on height and weight. Your health care provider can help determine your BMI and help you achieve or maintain a healthy weight. Get regular exercise Get regular exercise. This is one of the most important things you can do for your health. Most adults should:  Exercise for at least 150 minutes each week. The exercise should increase your heart rate and make you sweat (moderate-intensity exercise).  Do strengthening exercises at least twice a week. This is in addition to the moderate-intensity exercise.  Spend less time sitting. Even light physical activity can be beneficial. Watch cholesterol and blood lipids Have your blood tested for lipids and cholesterol at 36 years of age, then have this test every 5 years. Have your cholesterol levels checked more often if:  Your lipid or cholesterol levels are high.  You are older than 36 years of age.  You are at high risk for heart disease. What should I know about cancer screening? Depending on your health history and family history, you may need to have cancer screening at various ages. This may include screening for:  Breast cancer.  Cervical cancer.  Colorectal cancer.  Skin cancer.  Lung cancer. What should I know about heart disease, diabetes, and high blood  pressure? Blood pressure and heart disease  High blood pressure causes heart disease and increases the risk of stroke. This is more likely to develop in people who have high blood pressure readings, are of African descent, or are overweight.  Have your blood pressure checked: ? Every 3-5 years if you are 54-9 years of age. ? Every year if you are 69 years old or older. Diabetes Have regular diabetes screenings. This checks your fasting blood sugar level. Have the screening done:  Once every three years after age 36 if you are at a normal weight and have a low risk for diabetes.  More often and at a younger age if you are overweight or have a high risk for diabetes. What should I know about preventing infection? Hepatitis B If you have a higher risk for hepatitis B, you should be screened for this virus. Talk with your health care provider to find out if you are at risk for hepatitis B infection. Hepatitis C Testing is recommended for:  Everyone born from [birth year] through 1967.  Anyone with known risk factors for hepatitis C. Sexually transmitted infections (STIs)  Get screened for STIs, including gonorrhea and chlamydia, if: ? You are sexually active and are younger than 36 years of age. ? You are older than 36 years of age and your health care provider tells you that you are at risk for this type of infection. ? Your sexual activity has changed since you were last screened, and you are at increased risk for chlamydia or gonorrhea. Ask your health care provider  if you are at risk.  Ask your health care provider about whether you are at high risk for HIV. Your health care provider may recommend a prescription medicine to help prevent HIV infection. If you choose to take medicine to prevent HIV, you should first get tested for HIV. You should then be tested every 3 months for as long as you are taking the medicine. Pregnancy  If you are about to stop having your period (premenopausal) and  you may become pregnant, seek counseling before you get pregnant.  Take 400 to 800 micrograms (mcg) of folic acid every day if you become pregnant.  Ask for birth control (contraception) if you want to prevent pregnancy. Osteoporosis and menopause Osteoporosis is a disease in which the bones lose minerals and strength with aging. This can result in bone fractures. If you are 69 years old or older, or if you are at risk for osteoporosis and fractures, ask your health care provider if you should:  Be screened for bone loss.  Take a calcium or vitamin D supplement to lower your risk of fractures.  Be given hormone replacement therapy (HRT) to treat symptoms of menopause. Follow these instructions at home: Lifestyle  Do not use any products that contain nicotine or tobacco, such as cigarettes, e-cigarettes, and chewing tobacco. If you need help quitting, ask your health care provider.  Do not use street drugs.  Do not share needles.  Ask your health care provider for help if you need support or information about quitting drugs. Alcohol use  Do not drink alcohol if: ? Your health care provider tells you not to drink. ? You are pregnant, may be pregnant, or are planning to become pregnant.  If you drink alcohol: ? Limit how much you use to 0-1 drink a day. ? Limit intake if you are breastfeeding.  Be aware of how much alcohol is in your drink. In the [place] one drink equals one 12 oz bottle of beer (355 mL), one 5 oz glass of wine (148 mL), or one 1 oz glass of hard liquor (44 mL). General instructions  Schedule regular health, dental, and eye exams.  Stay current with your vaccines.  Tell your health care provider if: ? You often feel depressed. ? You have ever been abused or do not feel safe at home. Summary  Adopting a healthy lifestyle and getting preventive care are important in promoting health and wellness.  Follow your health care provider's instructions about healthy  diet, exercising, and getting tested or screened for diseases.  Follow your health care provider's instructions on monitoring your cholesterol and blood pressure. This information is not intended to replace advice given to you by your health care provider. Make sure you discuss any questions you have with your health care provider. Document Revised: 05/17/2018 Document Reviewed: 05/17/2018 Elsevier Patient Education  2020 [facility]) Exercise Recommendation  Being physically active is important to prevent heart disease and stroke, the nation's No. 1and No. 5killers. To improve overall cardiovascular health, we suggest at least 150 minutes per week of moderate exercise or 75 minutes per week of vigorous exercise (or a combination of moderate and vigorous activity). Thirty minutes a day, five times a week is an easy goal to remember. You will also experience benefits even if you divide your time into two or three segments of 10 to 15 minutes per day.  For people who would benefit from lowering their blood pressure or cholesterol, we  recommend 40 minutes of aerobic exercise of moderate to vigorous intensity three to four times a week to lower the risk for heart attack and stroke.  Physical activity is anything that makes you move your body and burn calories.  This includes things like climbing stairs or playing sports. Aerobic exercises benefit your heart, and include walking, jogging, swimming or biking. Strength and stretching exercises are best for overall stamina and flexibility.  The simplest, positive change you can make to effectively improve your heart health is to start walking. It's enjoyable, free, easy, social and great exercise. A walking program is flexible and boasts high success rates because people can stick with it. It's easy for walking to become a regular and satisfying part of life.   For Overall Cardiovascular Health:  At least 30 minutes  of moderate-intensity aerobic activity at least 5 days per week for a total of 150  OR   At least 25 minutes of vigorous aerobic activity at least 3 days per week for a total of 75 minutes; or a combination of moderate- and vigorous-intensity aerobic activity  AND   Moderate- to high-intensity muscle-strengthening activity at least 2 days per week for additional health benefits.  For Lowering Blood Pressure and Cholesterol  An average 40 minutes of moderate- to vigorous-intensity aerobic activity 3 or 4 times per week  What if I can't make it to the time goal? Something is always better than nothing! And everyone has to start somewhere. Even if you've been sedentary for years, today is the day you can begin to make healthy changes in your life. If you don't think you'll make it for 30 or 40 minutes, set a reachable goal for today. You can work up toward your overall goal by increasing your time as you get stronger. Don't let all-or-nothing thinking rob you of doing what you can every day.  Source:http://www.heart.org    

## 2019-10-03 ENCOUNTER — Other Ambulatory Visit: Payer: Self-pay | Admitting: Nurse Practitioner

## 2019-10-03 DIAGNOSIS — R000 Tachycardia, unspecified: Secondary | ICD-10-CM

## 2019-10-03 LAB — CBC
Hematocrit: 35.8 % (ref 34.0–46.6)
Hemoglobin: 11.1 g/dL (ref 11.1–15.9)
MCH: 24.7 pg — ABNORMAL LOW (ref 26.6–33.0)
MCHC: 31 g/dL — ABNORMAL LOW (ref 31.5–35.7)
MCV: 80 fL (ref 79–97)
Platelets: 557 10*3/uL — ABNORMAL HIGH (ref 150–450)
RBC: 4.49 x10E6/uL (ref 3.77–5.28)
RDW: 14.7 % (ref 11.7–15.4)
WBC: 8.1 10*3/uL (ref 3.4–10.8)

## 2019-10-03 LAB — HEMOGLOBIN A1C
Est. average glucose Bld gHb Est-mCnc: 123 mg/dL
Hgb A1c MFr Bld: 5.9 % — ABNORMAL HIGH (ref 4.8–5.6)

## 2019-10-03 LAB — BASIC METABOLIC PANEL
BUN/Creatinine Ratio: 15 (ref 9–23)
BUN: 11 mg/dL (ref 6–20)
CO2: 21 mmol/L (ref 20–29)
Calcium: 9.5 mg/dL (ref 8.7–10.2)
Chloride: 98 mmol/L (ref 96–106)
Creatinine, Ser: 0.74 mg/dL (ref 0.57–1.00)
GFR calc Af Amer: 121 mL/min/{1.73_m2} (ref >59)
GFR calc non Af Amer: 105 mL/min/{1.73_m2} (ref >59)
Glucose: 102 mg/dL — ABNORMAL HIGH (ref 65–99)
Potassium: 4.4 mmol/L (ref 3.5–5.2)
Sodium: 137 mmol/L (ref 134–144)

## 2019-10-03 LAB — LIPID PANEL
Chol/HDL Ratio: 4 ratio (ref 0.0–4.4)
Cholesterol, Total: 155 mg/dL (ref 100–199)
HDL: 39 mg/dL — ABNORMAL LOW (ref >39)
LDL Chol Calc (NIH): 99 mg/dL (ref 0–99)
Triglycerides: 88 mg/dL (ref 0–149)
VLDL Cholesterol Cal: 17 mg/dL (ref 5–40)

## 2019-10-03 LAB — TSH: TSH: 1.83 u[IU]/mL (ref 0.450–4.500)

## 2019-10-18 ENCOUNTER — Ambulatory Visit (INDEPENDENT_AMBULATORY_CARE_PROVIDER_SITE_OTHER): Payer: BC Managed Care – PPO | Admitting: Cardiology

## 2019-10-18 ENCOUNTER — Other Ambulatory Visit: Payer: Self-pay

## 2019-10-18 ENCOUNTER — Telehealth: Payer: Self-pay | Admitting: Radiology

## 2019-10-18 ENCOUNTER — Encounter: Payer: Self-pay | Admitting: Cardiology

## 2019-10-18 VITALS — BP 158/93 | HR 118 | Ht 63.0 in | Wt 341.6 lb

## 2019-10-18 DIAGNOSIS — R000 Tachycardia, unspecified: Secondary | ICD-10-CM

## 2019-10-18 DIAGNOSIS — R0600 Dyspnea, unspecified: Secondary | ICD-10-CM

## 2019-10-18 DIAGNOSIS — G4733 Obstructive sleep apnea (adult) (pediatric): Secondary | ICD-10-CM

## 2019-10-18 DIAGNOSIS — R002 Palpitations: Secondary | ICD-10-CM

## 2019-10-18 DIAGNOSIS — R0609 Other forms of dyspnea: Secondary | ICD-10-CM

## 2019-10-18 DIAGNOSIS — I10 Essential (primary) hypertension: Secondary | ICD-10-CM

## 2019-10-18 MED ORDER — AMLODIPINE BESYLATE 5 MG PO TABS: 5.0000 mg | ORAL_TABLET | Freq: Every day | ORAL | 1 refills | Status: DC

## 2019-10-18 NOTE — Telephone Encounter (Signed)
Enrolled patient for a 3 day Zio monitor to be mailed to patients home.  

## 2019-10-18 NOTE — Patient Instructions (Signed)
Medication Instructions:  INCREASE amlodipine to 5 mg daily  *If you need a refill on your cardiac medications before your next appointment, please call your pharmacy*  Testing/Procedures: Your physician has requested that you have an echocardiogram. Echocardiography is a painless test that uses sound waves to create images of your heart. It provides your doctor with information about the size and shape of your heart and how well your heart's chambers and valves are working. This procedure takes approximately one hour. There are no restrictions for this procedure. This will be done at our [facility] location:  [place] has requested you wear your ZIO patch monitor 3 days.   This is a single patch monitor.  [employer] supplies one patch monitor per enrollment.  Additional stickers are not available.   Please do not apply patch if you will be having a Nuclear Stress Test, Echocardiogram, Cardiac CT, MRI, or Chest Xray during the time frame you would be wearing the monitor. The patch cannot be worn during these tests.  You cannot remove and re-apply the ZIO XT patch monitor.   Your ZIO patch monitor will be sent USPS Priority mail from [facility] directly to your home address. The monitor may also be mailed to a PO BOX if home delivery is not available.   It may take 3-5 days to receive your monitor after you have been enrolled.   Once you have received you monitor, please review enclosed instructions.  Your monitor has already been registered assigning a specific monitor serial # to you.   Applying the monitor   Shave hair from upper left chest.   Hold abrader disc by orange tab.  Rub abrader in 40 strokes over left upper chest as indicated in your monitor instructions.   Clean area with 4 enclosed alcohol pads .  Use all pads to assure are is cleaned thoroughly.  Let dry.   Apply patch as indicated  in monitor instructions.  Patch will be place under collarbone on left side of chest with arrow pointing upward.   Rub patch adhesive wings for 2 minutes.Remove white label marked "1".  Remove white label marked "2".  Rub patch adhesive wings for 2 additional minutes.   While looking in a mirror, press and release button in center of patch.  A small green light will flash 3-4 times .  This will be your only indicator the monitor has been turned on.     Do not shower for the first 24 hours.  You may shower after the first 24 hours.   Press button if you feel a symptom. You will hear a small click.  Record Date, Time and Symptom in the Patient Log Book.   When you are ready to remove patch, follow instructions on last 2 pages of Patient Log Book.  Stick patch monitor onto last page of Patient Log Book.   Place Patient Log Book in [place] box.  Use locking tab on box and tape box closed securely.  The [employer] has [employer] on it.  Please place in mailbox as soon as possible.  Your physician should have your test results approximately 7 days after the monitor has been mailed back to [facility].   Call [place] at [phone] if you have questions regarding your ZIO XT patch monitor.  Call them immediately if you see an orange light blinking  on your monitor.   If your monitor falls off in less than 4 days contact our Monitor department at [phone].  If your monitor becomes loose or falls off after 4 days call [employer] at [phone] for suggestions on securing your monitor.   Follow-Up: At [facility], you and your health needs are our priority.  As part of our continuing mission to provide you with exceptional heart care, we have created designated Provider Care Teams.  These Care Teams include your primary Cardiologist (physician) and Advanced Practice Providers (APPs -  Physician Assistants and Nurse Practitioners) who all work together to provide  you with the care you need, when you need it.  We recommend signing up for the patient portal called "MyChart".  Sign up information is provided on this After Visit Summary.  MyChart is used to connect with patients for Virtual Visits (Telemedicine).  Patients are able to view lab/test results, encounter notes, upcoming appointments, etc.  Non-urgent messages can be sent to your provider as well.   To learn more about what you can do with MyChart, go to [email or web address].    Your next appointment:   2 month(s)  The format for your next appointment:   In Person  Provider:   Oswaldo Milian, MD   Other Instructions Please monitor blood pressure daily at home, write it down.  Call in 2 weeks with readings for Dr. Gardiner Rhyme to review.

## 2019-10-18 NOTE — Progress Notes (Signed)
Cardiology Office Note:    Date:  10/18/2019   ID:  Mikayla Rios, DOB 02/16/1986, MRN [id number]  PCP:  [name], MD  Cardiologist:  No primary care provider on file.  Electrophysiologist:  None   Referring MD: Minette Brine, FNP   Chief Complaint  Patient presents with  . Tachycardia    History of Present Illness:    Mikayla Rios is a 34 y.o. female with a hx of hypertension, OSA who is referred by Minette Brine, FNP for evaluation of tachycardia.  She reports that she has been having episodes where she feels like her heart is racing.  Occurs 1-2 times per day.  Denies any lightheadedness or syncope.  Denies any chest pain but does report she has been having dyspnea with exertion.  States that she been going for 5-minute walks, but has been limited by back pain and shortness of breath.  Does report intermittent lower extremity edema.  Reports she has gained about 20 pounds in the last year.  States that she checks her BP at home, has been in the 110s to 180s over 60s to 80s.  No smoking history.  Father was diagnosed with CHF in 44s.    Past Medical History:  Diagnosis Date  . Allergy    seasonal  . Blood infection ([place]) 1985  . Blood transfusion without reported diagnosis   . Hypertension     Past Surgical History:  Procedure Laterality Date  . NO PAST SURGERIES      Current Medications: Current Meds  Medication Sig  . amLODipine (NORVASC) 5 MG tablet Take 1 tablet (5 mg total) by mouth daily.  . ferrous sulfate 325 (65 FE) MG tablet Take 325 mg by mouth 2 (two) times daily. 1 per day  . lisinopril-hydrochlorothiazide (ZESTORETIC) 20-12.5 MG tablet Take 1 tablet by mouth daily.  . metoprolol succinate (TOPROL-XL) 25 MG 24 hr tablet Take 1 tablet (25 mg total) by mouth 2 (two) times daily.  . [DISCONTINUED] amLODipine (NORVASC) 5 MG tablet TAKE 1 TABLET BY MOUTH EVERY DAY (Patient taking differently: 1/2 tab)     Allergies:   Shellfish allergy and Penicillins    Social History   Socioeconomic History  . Marital status: Married    Spouse name: Not on file  . Number of children: 3  . Years of education: Not on file  . Highest education level: Not on file  Occupational History  . Not on file  Tobacco Use  . Smoking status: Never Smoker  . Smokeless tobacco: Never Used  Substance and Sexual Activity  . Alcohol use: Not Currently  . Drug use: No  . Sexual activity: Yes    Birth control/protection: None  Other Topics Concern  . Not on file  Social History Narrative  . Not on file   Social Determinants of Health   Financial Resource Strain:   . Difficulty of Paying Living Expenses:   Food Insecurity:   . Worried About Charity fundraiser in the Last Year:   . Arboriculturist in the Last Year:   Transportation Needs:   . Film/video editor (Medical):   Marland Kitchen Lack of Transportation (Non-Medical):   Physical Activity:   . Days of Exercise per Week:   . Minutes of Exercise per Session:   Stress:   . Feeling of Stress :   Social Connections:   . Frequency of Communication with Friends and Family:   . Frequency of Social Gatherings with  Friends and Family:   . Attends Religious Services:   . Active Member of Clubs or Organizations:   . Attends Archivist Meetings:   Marland Kitchen Marital Status:      Family History: The patient's family history includes Autism in her son; Cancer in her mother, paternal grandfather, and paternal uncle; Congestive Heart Failure in her father; Diabetes in her maternal grandfather, maternal grandmother, mother, and paternal grandmother; Heart disease in her paternal aunt; Hypertension in her father, maternal grandmother, and mother; Kidney disease in her maternal grandfather.  ROS:   Please see the history of present illness.     All other systems reviewed and are negative.  EKGs/Labs/Other Studies Reviewed:    The following studies were reviewed today:  EKG:  EKG is ordered today.  The ekg ordered  today demonstrates sinus tachycardia, rate 118, nonspecific T wave flattening  Recent Labs: 07/11/2019: ALT 13 10/02/2019: BUN 11; Creatinine, Ser 0.74; Hemoglobin 11.1; Platelets 557; Potassium 4.4; Sodium 137; TSH 1.830  Recent Lipid Panel    Component Value Date/Time   CHOL 155 10/02/2019 1127   TRIG 88 10/02/2019 1127   HDL 39 (L) 10/02/2019 1127   CHOLHDL 4.0 10/02/2019 1127   LDLCALC 99 10/02/2019 1127    Physical Exam:    VS:  BP (!) 158/93   Pulse (!) 118   Ht 5\' 3"  (1.6 m)   Wt (!) 341 lb 9.6 oz (154.9 kg)   LMP 09/22/2019   SpO2 97%   BMI 60.51 kg/m     Wt Readings from Last 3 Encounters:  10/18/19 (!) 341 lb 9.6 oz (154.9 kg)  10/02/19 (!) 340 lb 6.4 oz (154.4 kg)  08/14/19 (!) 343 lb 3.2 oz (155.7 kg)     GEN: in no acute distress HEENT: Normal NECK: No JVD LYMPHATICS: No lymphadenopathy CARDIAC: tachycardic, no murmurs, rubs, gallops RESPIRATORY:  Clear to auscultation without rales, wheezing or rhonchi  ABDOMEN: Soft, non-tender, non-distended MUSCULOSKELETAL:  No edema; No deformity  SKIN: Warm and dry NEUROLOGIC:  Alert and oriented x 3 PSYCHIATRIC:  Normal affect   ASSESSMENT:    1. Palpitations   2. Tachycardia   3. DOE (dyspnea on exertion)   4. OSA (obstructive sleep apnea)   5. Essential hypertension    PLAN:     Tachycardia/palpitations: We will check Zio patch x3 days to evaluate for arrhythmia.  Dyspnea: Suspect related to deconditioning.  Will check TTE to rule out structural heart disease.  Hypertension: On lisinopril-hydrochlorothiazide 20-12.5 mg, Toprol-XL 25 mg twice daily, amlodipine 2.5 mg daily.  BP elevated in clinic today and patient reports elevated pressures at home.  Will increase amlodipine to 5 mg daily and asked patient to check BP daily for next 2 weeks and call with results.  OSA: Encourage compliance with CPAP  RTC in 2 months  Medication Adjustments/Labs and Tests Ordered: Current medicines are reviewed at  length with the patient today.  Concerns regarding medicines are outlined above.  Orders Placed This Encounter  Procedures  . LONG TERM MONITOR (3-14 DAYS)  . EKG 12-Lead  . ECHOCARDIOGRAM COMPLETE   Meds ordered this encounter  Medications  . amLODipine (NORVASC) 5 MG tablet    Sig: Take 1 tablet (5 mg total) by mouth daily.    Dispense:  90 tablet    Refill:  1    Patient Instructions  Medication Instructions:  INCREASE amlodipine to 5 mg daily  *If you need a refill on your cardiac medications before  your next appointment, please call your pharmacy*  Testing/Procedures: Your physician has requested that you have an echocardiogram. Echocardiography is a painless test that uses sound waves to create images of your heart. It provides your doctor with information about the size and shape of your heart and how well your heart's chambers and valves are working. This procedure takes approximately one hour. There are no restrictions for this procedure. This will be done at our [facility] location:  [place] has requested you wear your ZIO patch monitor 3 days.   This is a single patch monitor.  [employer] supplies one patch monitor per enrollment.  Additional stickers are not available.   Please do not apply patch if you will be having a Nuclear Stress Test, Echocardiogram, Cardiac CT, MRI, or Chest Xray during the time frame you would be wearing the monitor. The patch cannot be worn during these tests.  You cannot remove and re-apply the ZIO XT patch monitor.   Your ZIO patch monitor will be sent USPS Priority mail from [facility] directly to your home address. The monitor may also be mailed to a PO BOX if home delivery is not available.   It may take 3-5 days to receive your monitor after you have been enrolled.   Once you have received you monitor, please review enclosed instructions.  Your  monitor has already been registered assigning a specific monitor serial # to you.   Applying the monitor   Shave hair from upper left chest.   Hold abrader disc by orange tab.  Rub abrader in 40 strokes over left upper chest as indicated in your monitor instructions.   Clean area with 4 enclosed alcohol pads .  Use all pads to assure are is cleaned thoroughly.  Let dry.   Apply patch as indicated in monitor instructions.  Patch will be place under collarbone on left side of chest with arrow pointing upward.   Rub patch adhesive wings for 2 minutes.Remove white label marked "1".  Remove white label marked "2".  Rub patch adhesive wings for 2 additional minutes.   While looking in a mirror, press and release button in center of patch.  A small green light will flash 3-4 times .  This will be your only indicator the monitor has been turned on.     Do not shower for the first 24 hours.  You may shower after the first 24 hours.   Press button if you feel a symptom. You will hear a small click.  Record Date, Time and Symptom in the Patient Log Book.   When you are ready to remove patch, follow instructions on last 2 pages of Patient Log Book.  Stick patch monitor onto last page of Patient Log Book.   Place Patient Log Book in [place] box.  Use locking tab on box and tape box closed securely.  The [employer] has [employer] on it.  Please place in mailbox as soon as possible.  Your physician should have your test results approximately 7 days after the monitor has been mailed back to [facility].   Call [place] at [phone] if you have questions regarding your ZIO XT patch monitor.  Call them immediately if you see an orange light blinking on your monitor.   If your monitor falls off in less than 4 days contact our Monitor department at  [phone].  If your monitor becomes loose or falls off after 4 days call [employer] at [phone] for suggestions on  securing your monitor.   Follow-Up: At [facility], you and your health needs are our priority.  As part of our continuing mission to provide you with exceptional heart care, we have created designated Provider Care Teams.  These Care Teams include your primary Cardiologist (physician) and Advanced Practice Providers (APPs -  Physician Assistants and Nurse Practitioners) who all work together to provide you with the care you need, when you need it.  We recommend signing up for the patient portal called "MyChart".  Sign up information is provided on this After Visit Summary.  MyChart is used to connect with patients for Virtual Visits (Telemedicine).  Patients are able to view lab/test results, encounter notes, upcoming appointments, etc.  Non-urgent messages can be sent to your provider as well.   To learn more about what you can do with MyChart, go to [email or web address].    Your next appointment:   2 month(s)  The format for your next appointment:   In Person  Provider:   Oswaldo Milian, MD   Other Instructions Please monitor blood pressure daily at home, write it down.  Call in 2 weeks with readings for Dr. Gardiner Rhyme to review.     Signed, [name], MD  10/18/2019 3:37 PM    [facility]

## 2019-10-23 ENCOUNTER — Ambulatory Visit (INDEPENDENT_AMBULATORY_CARE_PROVIDER_SITE_OTHER): Payer: BC Managed Care – PPO

## 2019-10-23 DIAGNOSIS — R002 Palpitations: Secondary | ICD-10-CM | POA: Diagnosis not present

## 2019-11-02 ENCOUNTER — Telehealth: Payer: Self-pay | Admitting: Cardiology

## 2019-11-02 MED ORDER — AMLODIPINE BESYLATE 5 MG PO TABS: 10.0000 mg | ORAL_TABLET | Freq: Every day | ORAL | 1 refills | Status: DC

## 2019-11-02 NOTE — Telephone Encounter (Signed)
Returned call to patient, aware of recommendations and verbalized understanding.

## 2019-11-02 NOTE — Telephone Encounter (Signed)
Pt c/o BP issue: STAT if pt c/o blurred vision, one-sided weakness or slurred speech  1. What are your last 5 BP readings?   BP: 146/89  HR:85 BP: 154/86  HR: 77 BP: 159/80  HR: 74 BP:149/84  HR: 75 BP: 148/81  HR: 80 BP: 138/82  HR: 80 BP: 149/82  HR: 85 BP: 140/84  HR: 80 BP: 156/89  HR: 85 BP: 154/94  HR: 83 BP: 139/76  HR: 80 BP: 151/80  HR: 85 BP: 135/81  HR: 98  BP: 156/77  HR: 79 BP: 144/80  HR: 80  2. Are you having any other symptoms (ex. Dizziness, headache, blurred vision, passed out)? No  3. What is your BP issue? Patient states she is calling to report the past 2 weeks BP readings for Dr. Gardiner Rhyme, as previously discussed.

## 2019-11-02 NOTE — Telephone Encounter (Signed)
BP remains elevated, recommend increasing amlodipine from 5 mg to 10 mg daily.  Continue to check BP daily and call in 2 weeks with results

## 2019-11-09 ENCOUNTER — Ambulatory Visit (HOSPITAL_COMMUNITY): Payer: BC Managed Care – PPO | Attending: Cardiology

## 2019-11-09 ENCOUNTER — Other Ambulatory Visit: Payer: Self-pay

## 2019-11-09 DIAGNOSIS — R002 Palpitations: Secondary | ICD-10-CM

## 2019-11-12 DIAGNOSIS — R002 Palpitations: Secondary | ICD-10-CM | POA: Diagnosis not present

## 2019-11-20 DIAGNOSIS — I10 Essential (primary) hypertension: Secondary | ICD-10-CM

## 2019-11-21 DIAGNOSIS — G4733 Obstructive sleep apnea (adult) (pediatric): Secondary | ICD-10-CM | POA: Diagnosis not present

## 2019-11-23 MED ORDER — LISINOPRIL-HYDROCHLOROTHIAZIDE 20-12.5 MG PO TABS: 2.0000 | ORAL_TABLET | Freq: Every day | ORAL | 3 refills | Status: DC

## 2019-11-23 NOTE — Telephone Encounter (Signed)
BP remains elevated, can we increase her lisinopril-HCTZ from 20-12.5 to 40-25 mg? And check BMET in 1 week?

## 2019-11-27 ENCOUNTER — Ambulatory Visit: Payer: BC Managed Care – PPO | Admitting: Family Medicine

## 2019-12-21 ENCOUNTER — Other Ambulatory Visit: Payer: Self-pay

## 2019-12-21 ENCOUNTER — Ambulatory Visit (INDEPENDENT_AMBULATORY_CARE_PROVIDER_SITE_OTHER): Payer: BC Managed Care – PPO | Admitting: Cardiology

## 2019-12-21 ENCOUNTER — Encounter: Payer: Self-pay | Admitting: Cardiology

## 2019-12-21 VITALS — BP 114/84 | HR 123 | Ht 63.0 in | Wt 329.0 lb

## 2019-12-21 DIAGNOSIS — R002 Palpitations: Secondary | ICD-10-CM | POA: Diagnosis not present

## 2019-12-21 DIAGNOSIS — R0600 Dyspnea, unspecified: Secondary | ICD-10-CM | POA: Diagnosis not present

## 2019-12-21 DIAGNOSIS — I10 Essential (primary) hypertension: Secondary | ICD-10-CM

## 2019-12-21 DIAGNOSIS — R000 Tachycardia, unspecified: Secondary | ICD-10-CM

## 2019-12-21 DIAGNOSIS — R0609 Other forms of dyspnea: Secondary | ICD-10-CM

## 2019-12-21 LAB — BASIC METABOLIC PANEL
BUN/Creatinine Ratio: 14 (ref 9–23)
BUN: 13 mg/dL (ref 6–20)
CO2: 23 mmol/L (ref 20–29)
Calcium: 9.6 mg/dL (ref 8.7–10.2)
Chloride: 99 mmol/L (ref 96–106)
Creatinine, Ser: 0.9 mg/dL (ref 0.57–1.00)
GFR calc Af Amer: 96 mL/min/{1.73_m2} (ref >59)
GFR calc non Af Amer: 83 mL/min/{1.73_m2} (ref >59)
Glucose: 99 mg/dL (ref 65–99)
Potassium: 4.1 mmol/L (ref 3.5–5.2)
Sodium: 139 mmol/L (ref 134–144)

## 2019-12-21 LAB — MAGNESIUM: Magnesium: 2 mg/dL (ref 1.6–2.3)

## 2019-12-21 NOTE — Patient Instructions (Signed)
Medication Instructions:  Your physician recommends that you continue on your current medications as directed. Please refer to the Current Medication list given to you today.  *If you need a refill on your cardiac medications before your next appointment, please call your pharmacy*   Lab Work: BMET, [place] today  If you have labs (blood work) drawn today and your tests are completely normal, you will receive your results only by: Marland Kitchen MyChart Message (if you have MyChart) OR . A paper copy in the mail If you have any lab test that is abnormal or we need to change your treatment, we will call you to review the results.  Follow-Up: At [facility], you and your health needs are our priority.  As part of our continuing mission to provide you with exceptional heart care, we have created designated Provider Care Teams.  These Care Teams include your primary Cardiologist (physician) and Advanced Practice Providers (APPs -  Physician Assistants and Nurse Practitioners) who all work together to provide you with the care you need, when you need it.  We recommend signing up for the patient portal called "MyChart".  Sign up information is provided on this After Visit Summary.  MyChart is used to connect with patients for Virtual Visits (Telemedicine).  Patients are able to view lab/test results, encounter notes, upcoming appointments, etc.  Non-urgent messages can be sent to your provider as well.   To learn more about what you can do with MyChart, go to [email or web address].    Your next appointment:   12 month(s)  The format for your next appointment:   In Person  Provider:   Oswaldo Milian, MD

## 2019-12-21 NOTE — Progress Notes (Signed)
Cardiology Office Note:    Date:  12/21/2019   ID:  Mikayla Rios, DOB 09-17-1985, MRN [id number]  PCP:  [name], MD  Cardiologist:  No primary care provider on file.  Electrophysiologist:  None   Referring MD: [name], MD   Chief Complaint  Patient presents with  . Hypertension    History of Present Illness:    Mikayla Rios is a 34 y.o. female with a hx of hypertension, OSA who presents for follow-up. She was referred by Minette Brine, FNP for evaluation of tachycardia, initially seen on 10/18/2019. She reports that she has been having episodes where she feels like her heart is racing.  Occurs 1-2 times per day.  Denies any lightheadedness or syncope.  Denies any chest pain but does report she has been having dyspnea with exertion.  States that she been going for 5-minute walks, but has been limited by back pain and shortness of breath.  Does report intermittent lower extremity edema.  Reports she has gained about 20 pounds in the last year.  States that she checks her BP at home, has been in the 110s to 180s over 60s to 80s.  No smoking history.  Father was diagnosed with CHF in 73s.  Echocardiogram on 11/09/2019 showed normal biventricular function, no significant valvular disease. Zio patch x3 days on 11/12/2019 showed no significant arrhythmias.  Since last clinic visit, she reports she has been doing well.  Lost 12 lbs since last clinic visit.  Stopped eating meat for awhile and now eating only chicken.  Has been doing home exercises on stairs.  Denies any chest pain or dyspnea.   Rare palpitations, only occurring about once per month now and lasting for a few seconds.  Reports BP has been 120-130/70s when checks at home.   Wt Readings from Last 3 Encounters:  12/21/19 (!) 329 lb (149.2 kg)  10/18/19 (!) 341 lb 9.6 oz (154.9 kg)  10/02/19 (!) 340 lb 6.4 oz (154.4 kg)     Past Medical History:  Diagnosis Date  . Allergy    seasonal  . Blood infection ([place]) 1985  .  Blood transfusion without reported diagnosis   . Hypertension     Past Surgical History:  Procedure Laterality Date  . NO PAST SURGERIES      Current Medications: Current Meds  Medication Sig  . amLODipine (NORVASC) 5 MG tablet Take 2 tablets (10 mg total) by mouth daily.  Marland Kitchen lisinopril-hydrochlorothiazide (ZESTORETIC) 20-12.5 MG tablet Take 2 tablets by mouth daily.  . metoprolol succinate (TOPROL-XL) 25 MG 24 hr tablet Take 1 tablet (25 mg total) by mouth 2 (two) times daily.     Allergies:   Shellfish allergy and Penicillins   Social History   Socioeconomic History  . Marital status: Married    Spouse name: Not on file  . Number of children: 3  . Years of education: Not on file  . Highest education level: Not on file  Occupational History  . Not on file  Tobacco Use  . Smoking status: Never Smoker  . Smokeless tobacco: Never Used  Vaping Use  . Vaping Use: Never used  Substance and Sexual Activity  . Alcohol use: Not Currently  . Drug use: No  . Sexual activity: Yes    Birth control/protection: None  Other Topics Concern  . Not on file  Social History Narrative  . Not on file   Social Determinants of Health   Financial Resource Strain:   .  Difficulty of Paying Living Expenses:   Food Insecurity:   . Worried About Charity fundraiser in the Last Year:   . Arboriculturist in the Last Year:   Transportation Needs:   . Film/video editor (Medical):   Marland Kitchen Lack of Transportation (Non-Medical):   Physical Activity:   . Days of Exercise per Week:   . Minutes of Exercise per Session:   Stress:   . Feeling of Stress :   Social Connections:   . Frequency of Communication with Friends and Family:   . Frequency of Social Gatherings with Friends and Family:   . Attends Religious Services:   . Active Member of Clubs or Organizations:   . Attends Archivist Meetings:   Marland Kitchen Marital Status:      Family History: The patient's family history includes  Autism in her son; Cancer in her mother, paternal grandfather, and paternal uncle; Congestive Heart Failure in her father; Diabetes in her maternal grandfather, maternal grandmother, mother, and paternal grandmother; Heart disease in her paternal aunt; Hypertension in her father, maternal grandmother, and mother; Kidney disease in her maternal grandfather.  ROS:   Please see the history of present illness.     All other systems reviewed and are negative.  EKGs/Labs/Other Studies Reviewed:    The following studies were reviewed today:  EKG:  EKG is ordered today.  The ekg ordered today demonstrates sinus tachycardia, rate 118, nonspecific T wave flattening  Recent Labs: 07/11/2019: ALT 13 10/02/2019: BUN 11; Creatinine, Ser 0.74; Hemoglobin 11.1; Platelets 557; Potassium 4.4; Sodium 137; TSH 1.830  Recent Lipid Panel    Component Value Date/Time   CHOL 155 10/02/2019 1127   TRIG 88 10/02/2019 1127   HDL 39 (L) 10/02/2019 1127   CHOLHDL 4.0 10/02/2019 1127   LDLCALC 99 10/02/2019 1127    Physical Exam:    VS:  BP 114/84   Pulse (!) 123   Ht 5\' 3"  (1.6 m)   Wt (!) 329 lb (149.2 kg)   SpO2 96%   BMI 58.28 kg/m     Wt Readings from Last 3 Encounters:  12/21/19 (!) 329 lb (149.2 kg)  10/18/19 (!) 341 lb 9.6 oz (154.9 kg)  10/02/19 (!) 340 lb 6.4 oz (154.4 kg)     GEN: in no acute distress HEENT: Normal NECK: No JVD LYMPHATICS: No lymphadenopathy CARDIAC: tachycardic, no murmurs, rubs, gallops RESPIRATORY:  Clear to auscultation without rales, wheezing or rhonchi  ABDOMEN: Soft, non-tender, non-distended MUSCULOSKELETAL:  No edema; No deformity  SKIN: Warm and dry NEUROLOGIC:  Alert and oriented x 3 PSYCHIATRIC:  Normal affect   ASSESSMENT:    1. Essential hypertension   2. Tachycardia   3. Palpitations   4. DOE (dyspnea on exertion)    PLAN:     Tachycardia/palpitations:  Zio patch x3 days on 11/12/2019 showed no significant arrhythmias.  Reports palpitations have  improved.  Dyspnea: Suspect related to deconditioning.  Echocardiogram on 11/09/2019 showed normal biventricular function, no significant valvular disease.  Hypertension: On lisinopril-hydrochlorothiazide 40-25 mg, Toprol-XL 25 mg twice daily, amlodipine 10 mg daily. Appears controlled. Has not checked renal function/electrolytes since increasing lisinopril-HCTZ, will check BMP/magnesium.  OSA: Encourage compliance with CPAP  RTC in 1 year  Medication Adjustments/Labs and Tests Ordered: Current medicines are reviewed at length with the patient today.  Concerns regarding medicines are outlined above.  Orders Placed This Encounter  Procedures  . Basic metabolic panel  . Magnesium   No  orders of the defined types were placed in this encounter.   Patient Instructions  Medication Instructions:  Your physician recommends that you continue on your current medications as directed. Please refer to the Current Medication list given to you today.  *If you need a refill on your cardiac medications before your next appointment, please call your pharmacy*   Lab Work: BMET, [place] today  If you have labs (blood work) drawn today and your tests are completely normal, you will receive your results only by: Marland Kitchen MyChart Message (if you have MyChart) OR . A paper copy in the mail If you have any lab test that is abnormal or we need to change your treatment, we will call you to review the results.  Follow-Up: At [facility], you and your health needs are our priority.  As part of our continuing mission to provide you with exceptional heart care, we have created designated Provider Care Teams.  These Care Teams include your primary Cardiologist (physician) and Advanced Practice Providers (APPs -  Physician Assistants and Nurse Practitioners) who all work together to provide you with the care you need, when you need it.  We recommend signing up for the patient portal called "MyChart".  Sign up information  is provided on this After Visit Summary.  MyChart is used to connect with patients for Virtual Visits (Telemedicine).  Patients are able to view lab/test results, encounter notes, upcoming appointments, etc.  Non-urgent messages can be sent to your provider as well.   To learn more about what you can do with MyChart, go to [email or web address].    Your next appointment:   12 month(s)  The format for your next appointment:   In Person  Provider:   Oswaldo Milian, MD        Signed, [name], MD  12/21/2019 2:08 PM    [facility]

## 2020-01-01 ENCOUNTER — Other Ambulatory Visit: Payer: Self-pay | Admitting: Internal Medicine

## 2020-01-02 ENCOUNTER — Other Ambulatory Visit: Payer: Self-pay | Admitting: Cardiology

## 2020-01-02 MED ORDER — AMLODIPINE BESYLATE 10 MG PO TABS: 10.0000 mg | ORAL_TABLET | Freq: Every day | ORAL | 3 refills | Status: DC

## 2020-01-02 MED ORDER — AMLODIPINE BESYLATE 5 MG PO TABS: 10.0000 mg | ORAL_TABLET | Freq: Every day | ORAL | 3 refills | Status: DC

## 2020-01-02 NOTE — Telephone Encounter (Signed)
Patient taking 2 5mg  tablets of amlodipine. Called patient to see if she'd like to change to a 10mg  tablet. She also requested that I call in the prescription rather than send it electronically. She said the pharmacy's system was down.   Called new rx into [facility].

## 2020-01-02 NOTE — Telephone Encounter (Signed)
Patient is calling to follow up in regards to refill request. She states the pharmacy has not yet received rx and she is going out of town shortly. She would like to pick up the medication prior to leaving town and she is requesting to have medication called in.

## 2020-01-02 NOTE — Telephone Encounter (Signed)
*  STAT* If patient is at the pharmacy, call can be transferred to refill team.   1. Which medications need to be refilled? (please list name of each medication and dose if known)?   amLODipine (NORVASC) 5 MG tablet    2. Which pharmacy/location (including street and city if local pharmacy) is medication to be sent to? [id number] [place], [place] - [address] AT [address]  3. Do they need a 30 day or 90 day supply? 90 day supply     Patient is completely out of this medication. She is leaving this evening to go out of town.

## 2020-02-06 ENCOUNTER — Ambulatory Visit: Payer: Medicaid Other | Admitting: Family Medicine

## 2020-03-14 DIAGNOSIS — G4733 Obstructive sleep apnea (adult) (pediatric): Secondary | ICD-10-CM | POA: Diagnosis not present

## 2020-04-30 ENCOUNTER — Ambulatory Visit: Payer: Medicaid Other | Admitting: Internal Medicine

## 2020-05-05 ENCOUNTER — Other Ambulatory Visit: Payer: Self-pay | Admitting: Internal Medicine

## 2020-05-05 DIAGNOSIS — R000 Tachycardia, unspecified: Secondary | ICD-10-CM

## 2020-05-15 ENCOUNTER — Encounter: Payer: Self-pay | Admitting: Nurse Practitioner

## 2020-05-15 ENCOUNTER — Other Ambulatory Visit: Payer: Self-pay

## 2020-05-15 ENCOUNTER — Ambulatory Visit (INDEPENDENT_AMBULATORY_CARE_PROVIDER_SITE_OTHER): Payer: Self-pay | Admitting: Nurse Practitioner

## 2020-05-15 VITALS — BP 138/74 | HR 97 | Temp 98.5°F | Ht 62.0 in | Wt 308.2 lb

## 2020-05-15 DIAGNOSIS — R519 Headache, unspecified: Secondary | ICD-10-CM

## 2020-05-15 DIAGNOSIS — I10 Essential (primary) hypertension: Secondary | ICD-10-CM

## 2020-05-15 DIAGNOSIS — Z6841 Body Mass Index (BMI) 40.0 and over, adult: Secondary | ICD-10-CM

## 2020-05-15 MED ORDER — METOPROLOL SUCCINATE ER 25 MG PO TB24: 25.0000 mg | ORAL_TABLET | Freq: Two times a day (BID) | ORAL | 1 refills | Status: DC

## 2020-05-15 NOTE — Progress Notes (Signed)
I,Tianna Badgett,acting as a Education administrator for [employer], NP.,have documented all relevant documentation on the behalf of [employer], NP,as directed by  Bary Castilla, NP while in the presence of Bary Castilla, NP.  This visit occurred during the SARS-CoV-2 public health emergency.  Safety protocols were in place, including screening questions prior to the visit, additional usage of staff PPE, and extensive cleaning of exam room while observing appropriate contact time as indicated for disinfecting solutions.  Subjective:     Patient ID: Mikayla Rios , female    DOB: [birth year] , 34 y.o.   MRN: [id number]   Chief Complaint  Patient presents with  . Hypertension    HPI  Here for hypertension follow up. She has been compliant with her medication. She also follows up with a cardiologist. She has no concerns at this time. She has recently changed her eating habits and as a result she has lost weight.  She is also having some slight headaches which she believes may be due to stress factors from work and home. She is going to put in her notice at work soon. She currently works night shift.   Diet: changed her diet. She cut down on her carbs. Keto diet. Vegan diet.   Exercise: She will try with that soon.   Wt Readings from Last 3 Encounters: 05/15/20 : (!) 308 lb 3.2 oz (139.8 kg) 12/21/19 : (!) 329 lb (149.2 kg) 10/18/19 : (!) 341 lb 9.6 oz (154.9 kg)    Hypertension This is a chronic problem. The current episode started more than 1 year ago. The problem is controlled. Associated symptoms include headaches. Pertinent negatives include no anxiety or shortness of breath. There are no associated agents to hypertension. Risk factors for coronary artery disease include obesity and sedentary lifestyle. Past treatments include calcium channel blockers, ACE inhibitors and beta blockers. There are no compliance problems.  There is no history of angina. There is no history of chronic  renal disease.     Past Medical History:  Diagnosis Date  . Allergy    seasonal  . Blood infection ([place]) 1985  . Blood transfusion without reported diagnosis   . Hypertension      Family History  Problem Relation Age of Onset  . Cancer Mother        breast, and in remission  . Diabetes Mother   . Hypertension Mother   . Autism Son   . Heart disease Paternal Aunt   . Cancer Paternal Uncle        colon  . Diabetes Maternal Grandmother   . Hypertension Maternal Grandmother   . Diabetes Maternal Grandfather   . Kidney disease Maternal Grandfather   . Diabetes Paternal Grandmother   . Cancer Paternal Grandfather        colon  . Congestive Heart Failure Father   . Hypertension Father      Current Outpatient Medications:  .  amLODipine (NORVASC) 10 MG tablet, Take 1 tablet (10 mg total) by mouth daily., Disp: 90 tablet, Rfl: 3 .  lisinopril-hydrochlorothiazide (ZESTORETIC) 20-12.5 MG tablet, Take 2 tablets by mouth daily., Disp: 180 tablet, Rfl: 3 .  metoprolol succinate (TOPROL-XL) 25 MG 24 hr tablet, Take 1 tablet (25 mg total) by mouth 2 (two) times daily., Disp: 180 tablet, Rfl: 1   Allergies  Allergen Reactions  . Shellfish Allergy Hives  . Penicillins Hives and Rash    Has patient had a PCN reaction causing immediate rash, facial/tongue/throat swelling, SOB or  lightheadedness with hypotension: No Has patient had a PCN reaction causing severe rash involving mucus membranes or skin necrosis: hives Has patient had a PCN reaction that required hospitalization No Has patient had a PCN reaction occurring within the last 10 years: yes If all of the above answers are "NO", then may proceed with Cephalosporin use.      Review of Systems  Constitutional: Negative.  Negative for fatigue.  Respiratory: Negative.  Negative for cough, shortness of breath and wheezing.   Cardiovascular: Negative.   Neurological: Positive for headaches.     Today's Vitals   05/15/20 0842   BP: 138/74  Pulse: 97  Temp: 98.5 F (36.9 C)  TempSrc: Oral  Weight: (!) 308 lb 3.2 oz (139.8 kg)  Height: 5\' 2"  (1.575 m)   Body mass index is 56.37 kg/m.  Wt Readings from Last 3 Encounters:  05/15/20 (!) 308 lb 3.2 oz (139.8 kg)  12/21/19 (!) 329 lb (149.2 kg)  10/18/19 (!) 341 lb 9.6 oz (154.9 kg)     Objective:  Physical Exam Constitutional:      Appearance: She is obese.  Cardiovascular:     Rate and Rhythm: Normal rate and regular rhythm.     Pulses: Normal pulses.     Heart sounds: Normal heart sounds.  Pulmonary:     Effort: Pulmonary effort is normal. No respiratory distress.     Breath sounds: Normal breath sounds. No rales.  Neurological:     Mental Status: She is alert and oriented to person, place, and time.     Motor: No weakness.  Psychiatric:        Mood and Affect: Mood normal.        Behavior: Behavior normal.        Thought Content: Thought content normal.        Judgment: Judgment normal.         Assessment And Plan:     1. Essential hypertension -Chronic, stable  -Followed by cardiologist  -Educated patient about eating less sodium and processed food along with exercise  - refilled metoprolol succinate (TOPROL-XL) 25 MG 24 hr tablet; Take 1 tablet (25 mg total) by mouth 2 (two) times daily.  Dispense: 180 tablet; Refill: 1  2. Acute nonintractable headache, unspecified headache type -Patient identified stress factors with work and home life that may be causing HA  -Educated about OTC med.  -Will follow up on next visit and prescribe something if needed  3. Morbid obesity with BMI of 50.0-59.9, adult ([place]) -Congratulated patient on the recent lifestyle modification she has made and weight loss of 21 pds.  -She has made changes to her diet along with her husband. She eats less carbs, keto diet and vegan diet.  -Encouraged her to continue with her diet and to start exercising for atleast 30-45 min. Daily.   -She is encouraged to strive  for BMI less than 30 to decrease cardiac risk. Advised to aim for at least 150 minutes of exercise per week.   Patient was given opportunity to ask questions. Patient verbalized understanding of the plan and was able to repeat key elements of the plan. All questions were answered to their satisfaction.  Bary Castilla, NP   I, Bary Castilla, NP, have reviewed all documentation for this visit. The documentation on 05/15/20 for the exam, diagnosis, procedures, and orders are all accurate and complete.  THE PATIENT IS ENCOURAGED TO PRACTICE SOCIAL DISTANCING DUE TO THE COVID-19 PANDEMIC.

## 2020-05-15 NOTE — Patient Instructions (Signed)

## 2020-07-29 ENCOUNTER — Encounter: Payer: Self-pay | Admitting: Nurse Practitioner

## 2020-07-29 ENCOUNTER — Ambulatory Visit (INDEPENDENT_AMBULATORY_CARE_PROVIDER_SITE_OTHER): Payer: BC Managed Care – PPO | Admitting: Nurse Practitioner

## 2020-07-29 ENCOUNTER — Other Ambulatory Visit: Payer: Self-pay

## 2020-07-29 VITALS — BP 136/80 | HR 115 | Temp 99.5°F | Ht 62.2 in | Wt 301.2 lb

## 2020-07-29 DIAGNOSIS — R300 Dysuria: Secondary | ICD-10-CM | POA: Diagnosis not present

## 2020-07-29 LAB — POCT URINALYSIS DIPSTICK
Bilirubin, UA: NEGATIVE
Blood, UA: NEGATIVE
Glucose, UA: NEGATIVE
Ketones, UA: NEGATIVE
Leukocytes, UA: NEGATIVE
Nitrite, UA: NEGATIVE
Protein, UA: NEGATIVE
Spec Grav, UA: 1.025 (ref 1.010–1.025)
Urobilinogen, UA: 0.2 E.U./dL
pH, UA: 6.5 (ref 5.0–8.0)

## 2020-07-29 NOTE — Progress Notes (Signed)
I,Yamilka Roman [employer] as a Education administrator for [employer], FNP.,have documented all relevant documentation on the behalf of Minette Brine, FNP,as directed by  Minette Brine, FNP while in the presence of Minette Brine, [place]. This visit occurred during the SARS-CoV-2 public health emergency.  Safety protocols were in place, including screening questions prior to the visit, additional usage of staff PPE, and extensive cleaning of exam room while observing appropriate contact time as indicated for disinfecting solutions.  Subjective:     Patient ID: Mikayla Rios , female    DOB: 31-Mar-1986 , 35 y.o.   MRN: [id number]   Chief Complaint  Patient presents with  . Dysuria    Patient stated when she goes pee and it burns she did do a home test and it was positive for a UTI.     HPI  She did take an at home test about 8 days ago and was positive for a urinary tract infection  Dysuria  This is a new problem. The current episode started 1 to 4 weeks ago (2 weeks ago). The problem occurs intermittently. The problem has been unchanged. The quality of the pain is described as aching. There has been no fever. She is not sexually active. There is no history of pyelonephritis. Associated symptoms include flank pain. Pertinent negatives include no chills, discharge, frequency, sweats or urgency. Associated symptoms comments: After urinating feels like she is not emptying completely. Treatments tried: AZO cranberry pills and cranberry juice.   Her past medical history is significant for recurrent UTIs.     Past Medical History:  Diagnosis Date  . Allergy    seasonal  . Blood infection ([place]) 1985  . Blood transfusion without reported diagnosis   . Hypertension      Family History  Problem Relation Age of Onset  . Cancer Mother        breast, and in remission  . Diabetes Mother   . Hypertension Mother   . Autism Son   . Heart disease Paternal Aunt   . Cancer Paternal Uncle        colon  . Diabetes  Maternal Grandmother   . Hypertension Maternal Grandmother   . Diabetes Maternal Grandfather   . Kidney disease Maternal Grandfather   . Diabetes Paternal Grandmother   . Cancer Paternal Grandfather        colon  . Congestive Heart Failure Father   . Hypertension Father      Current Outpatient Medications:  .  amLODipine (NORVASC) 10 MG tablet, Take 1 tablet (10 mg total) by mouth daily., Disp: 90 tablet, Rfl: 3 .  lisinopril-hydrochlorothiazide (ZESTORETIC) 20-12.5 MG tablet, Take 2 tablets by mouth daily., Disp: 180 tablet, Rfl: 3 .  metoprolol succinate (TOPROL-XL) 25 MG 24 hr tablet, Take 1 tablet (25 mg total) by mouth 2 (two) times daily., Disp: 180 tablet, Rfl: 1 .  amLODipine (NORVASC) 5 MG tablet, 1/2 tab, Disp: 90 tablet, Rfl: 1   Allergies  Allergen Reactions  . Shellfish Allergy Hives  . Penicillins Hives and Rash    Has patient had a PCN reaction causing immediate rash, facial/tongue/throat swelling, SOB or lightheadedness with hypotension: No Has patient had a PCN reaction causing severe rash involving mucus membranes or skin necrosis: hives Has patient had a PCN reaction that required hospitalization No Has patient had a PCN reaction occurring within the last 10 years: yes If all of the above answers are "NO", then may proceed with Cephalosporin use.  Review of Systems  Constitutional: Negative for chills.  Respiratory: Negative.   Cardiovascular: Negative.   Gastrointestinal: Negative.   Genitourinary: Positive for dysuria and flank pain. Negative for frequency and urgency.     Today's Vitals   07/29/20 1618  BP: 136/80  Pulse: (!) 115  Temp: 99.5 F (37.5 C)  TempSrc: Oral  Weight: (!) 301 lb 3.2 oz (136.6 kg)  Height: 5' 2.2" (1.58 m)  PainSc: 3   PainLoc: Back   Body mass index is 54.74 kg/m.   Objective:  Physical Exam Vitals reviewed.  Constitutional:      Appearance: She is obese.  Cardiovascular:     Rate and Rhythm: Normal rate  and regular rhythm.     Pulses: Normal pulses.     Heart sounds: Normal heart sounds.  Pulmonary:     Effort: Pulmonary effort is normal. No respiratory distress.     Breath sounds: Normal breath sounds. No wheezing or rales.  Abdominal:     General: Bowel sounds are normal. There is no distension.     Palpations: Abdomen is soft.     Tenderness: There is no abdominal tenderness. There is no right CVA tenderness or left CVA tenderness.  Neurological:     Mental Status: She is alert and oriented to person, place, and time.     Motor: No weakness.  Psychiatric:        Mood and Affect: Mood normal.        Behavior: Behavior normal.        Thought Content: Thought content normal.        Judgment: Judgment normal.         Assessment And Plan:     1. Dysuria  She is feeling better  Will send urine for culture  Encouraged to continue with fluids and cranberry juice. - POCT Urinalysis Dipstick (09326) - Urine Culture     Patient was given opportunity to ask questions. Patient verbalized understanding of the plan and was able to repeat key elements of the plan. All questions were answered to their satisfaction.  Minette Brine, FNP    I, Minette Brine, FNP, have reviewed all documentation for this visit. The documentation on 07/29/20 for the exam, diagnosis, procedures, and orders are all accurate and complete.  THE PATIENT IS ENCOURAGED TO PRACTICE SOCIAL DISTANCING DUE TO THE COVID-19 PANDEMIC.

## 2020-07-30 LAB — URINE CULTURE

## 2020-08-26 ENCOUNTER — Other Ambulatory Visit: Payer: Self-pay | Admitting: Internal Medicine

## 2020-10-06 ENCOUNTER — Encounter: Payer: Self-pay | Admitting: Nurse Practitioner

## 2020-10-06 ENCOUNTER — Telehealth (INDEPENDENT_AMBULATORY_CARE_PROVIDER_SITE_OTHER): Payer: BC Managed Care – PPO | Admitting: Nurse Practitioner

## 2020-10-06 ENCOUNTER — Encounter: Payer: Self-pay | Admitting: Internal Medicine

## 2020-10-06 ENCOUNTER — Telehealth: Payer: Self-pay

## 2020-10-06 ENCOUNTER — Other Ambulatory Visit: Payer: Self-pay

## 2020-10-06 VITALS — Ht 64.0 in | Wt 290.0 lb

## 2020-10-06 DIAGNOSIS — U071 COVID-19: Secondary | ICD-10-CM | POA: Diagnosis not present

## 2020-10-06 DIAGNOSIS — R059 Cough, unspecified: Secondary | ICD-10-CM | POA: Diagnosis not present

## 2020-10-06 MED ORDER — BENZONATATE 100 MG PO CAPS: 100.0000 mg | ORAL_CAPSULE | Freq: Four times a day (QID) | ORAL | 1 refills | Status: DC | PRN

## 2020-10-06 MED ORDER — PREDNISONE 20 MG PO TABS: ORAL_TABLET | ORAL | 0 refills | Status: DC

## 2020-10-06 NOTE — Telephone Encounter (Signed)
Patient consented to virtual appt YL,RMA

## 2020-10-06 NOTE — Progress Notes (Signed)
Virtual Visit via MyChart   This visit type was conducted due to national recommendations for restrictions regarding the COVID-19 Pandemic (e.g. social distancing) in an effort to limit this patient's exposure and mitigate transmission in our community.  Due to her co-morbid illnesses, this patient is at least at moderate risk for complications without adequate follow up.  This format is felt to be most appropriate for this patient at this time.  All issues noted in this document were discussed and addressed.  A limited physical exam was performed with this format.    This visit type was conducted due to national recommendations for restrictions regarding the COVID-19 Pandemic (e.g. social distancing) in an effort to limit this patient's exposure and mitigate transmission in our community.  Patients identity confirmed using two different identifiers.  This format is felt to be most appropriate for this patient at this time.  All issues noted in this document were discussed and addressed.  No physical exam was performed (except for noted visual exam findings with Video Visits).    Date:  10/22/2020   ID:  Mikayla Rios, DOB 30-Jan-1986, MRN [id number]  Patient Location:  Home - spoke with Mikayla Rios  Provider location:   Office    Chief Complaint:  cough  History of Present Illness:    Mikayla Rios is a 35 y.o. female who presents via video conferencing for a telehealth visit today.    The patient does have symptoms concerning for COVID-19 infection (fever, chills, cough, or new shortness of breath).   She is having a cough, mucous and diarrhea. She is having shortness of breath when she is coughing a good bit. She tested today after finding out her husband was positive. She has been having a mild headache. She is also complaining of sore throat. This has leveled out. Over the last couple weeks she had a low grade fever.  She tested on Wednesday which was negative. Denies chest pain.  She has taken coricidan, zinc, vitamin C and tylenol for pain. Cough is keeping her up at night a little bit. She is able to taste and smell. Has been trying to stay hydrated and drink, intermittent nausea.     Past Medical History:  Diagnosis Date  . Allergy    seasonal  . Blood infection ([place]) 1985  . Blood transfusion without reported diagnosis   . Hypertension    Past Surgical History:  Procedure Laterality Date  . NO PAST SURGERIES       Current Meds  Medication Sig  . amLODipine (NORVASC) 10 MG tablet Take 1 tablet (10 mg total) by mouth daily.  . benzonatate (TESSALON PERLES) 100 MG capsule Take 1 capsule (100 mg total) by mouth every 6 (six) hours as needed.  Marland Kitchen lisinopril-hydrochlorothiazide (ZESTORETIC) 20-12.5 MG tablet Take 2 tablets by mouth daily.  . metoprolol succinate (TOPROL-XL) 25 MG 24 hr tablet Take 1 tablet (25 mg total) by mouth 2 (two) times daily.  . predniSONE (DELTASONE) 20 MG tablet Take 2 tablets by mouth daily x 3 days     Allergies:   Shellfish allergy and Penicillins   Social History   Tobacco Use  . Smoking status: Never Smoker  . Smokeless tobacco: Never Used  Vaping Use  . Vaping Use: Never used  Substance Use Topics  . Alcohol use: Not Currently  . Drug use: No     Family Hx: The patient's family history includes Autism in her son; Cancer in her mother,  paternal grandfather, and paternal uncle; Congestive Heart Failure in her father; Diabetes in her maternal grandfather, maternal grandmother, mother, and paternal grandmother; Heart disease in her paternal aunt; Hypertension in her father, maternal grandmother, and mother; Kidney disease in her maternal grandfather.  ROS:   Please see the history of present illness.    Review of Systems  Constitutional: Negative.   Respiratory: Negative.  Negative for cough.   Cardiovascular: Negative.   Neurological: Negative for dizziness and tingling.  Psychiatric/Behavioral: Negative.      All  other systems reviewed and are negative.   Labs/Other Tests and Data Reviewed:    Recent Labs: 12/21/2019: BUN 13; Creatinine, Ser 0.90; Magnesium 2.0; Potassium 4.1; Sodium 139   Recent Lipid Panel Lab Results  Component Value Date/Time   CHOL 155 10/02/2019 11:27 AM   TRIG 88 10/02/2019 11:27 AM   HDL 39 (L) 10/02/2019 11:27 AM   CHOLHDL 4.0 10/02/2019 11:27 AM   LDLCALC 99 10/02/2019 11:27 AM    Wt Readings from Last 3 Encounters:  10/06/20 290 lb (131.5 kg)  07/29/20 (!) 301 lb 3.2 oz (136.6 kg)  05/15/20 (!) 308 lb 3.2 oz (139.8 kg)     Exam:    Vital Signs:  Ht 5\' 4"  (1.626 m)   Wt 290 lb (131.5 kg)   LMP 09/26/2020   BMI 49.78 kg/m     Physical Exam Vitals reviewed.  Constitutional:      General: She is not in acute distress.    Appearance: Normal appearance.  Pulmonary:     Effort: Pulmonary effort is normal. No respiratory distress.  Neurological:     General: No focal deficit present.     Mental Status: She is alert and oriented to person, place, and time.     Cranial Nerves: No cranial nerve deficit.  Psychiatric:        Mood and Affect: Mood normal.        Behavior: Behavior normal.        Thought Content: Thought content normal.        Judgment: Judgment normal.     ASSESSMENT & PLAN:    1. Lab test positive for detection of COVID-19 virus She is to remain in quarantine until 10/17/2019. Be sure to stay well hydrated with water Encouraged to take aspirin 81 mg daily and vitamin c, vitamin d and zinc.   2. Cough rx for tessalon perles and prednisone If has worsening shortness of breath she is to go to ER    COVID-19 Education: The signs and symptoms of COVID-19 were discussed with the patient and how to seek care for testing (follow up with PCP or arrange E-visit).  The importance of social distancing was discussed today.  Patient Risk:   After full review of this patients clinical status, I feel that they are at least moderate risk at this  time.  Time:   Today, I have spent 11 minutes/ seconds with the patient with telehealth technology discussing above diagnoses.     Medication Adjustments/Labs and Tests Ordered: Current medicines are reviewed at length with the patient today.  Concerns regarding medicines are outlined above.   Tests Ordered: No orders of the defined types were placed in this encounter.   Medication Changes: Meds ordered this encounter  Medications  . benzonatate (TESSALON PERLES) 100 MG capsule    Sig: Take 1 capsule (100 mg total) by mouth every 6 (six) hours as needed.    Dispense:  30 capsule  Refill:  1  . predniSONE (DELTASONE) 20 MG tablet    Sig: Take 2 tablets by mouth daily x 3 days    Dispense:  6 tablet    Refill:  0    Disposition:  Follow up prn  Signed, Minette Brine, FNP

## 2020-10-08 ENCOUNTER — Encounter: Payer: BC Managed Care – PPO | Admitting: Internal Medicine

## 2020-10-08 ENCOUNTER — Encounter: Payer: Self-pay | Admitting: Internal Medicine

## 2020-10-23 ENCOUNTER — Encounter: Payer: Self-pay | Admitting: Internal Medicine

## 2020-10-23 ENCOUNTER — Other Ambulatory Visit: Payer: Self-pay

## 2020-10-23 MED ORDER — DICYCLOMINE HCL 10 MG PO CAPS: 10.0000 mg | ORAL_CAPSULE | Freq: Four times a day (QID) | ORAL | 0 refills | Status: DC

## 2020-10-27 ENCOUNTER — Telehealth: Payer: BC Managed Care – PPO | Admitting: Nurse Practitioner

## 2020-11-04 ENCOUNTER — Inpatient Hospital Stay (HOSPITAL_BASED_OUTPATIENT_CLINIC_OR_DEPARTMENT_OTHER)
Admission: EM | Admit: 2020-11-04 | Discharge: 2020-11-10 | DRG: 391 | Disposition: A | Discharge status: Home / Self Care | Payer: BC Managed Care – PPO | Attending: Surgery | Admitting: Surgery

## 2020-11-04 ENCOUNTER — Emergency Department (HOSPITAL_BASED_OUTPATIENT_CLINIC_OR_DEPARTMENT_OTHER): Payer: BC Managed Care – PPO

## 2020-11-04 ENCOUNTER — Other Ambulatory Visit: Payer: Self-pay

## 2020-11-04 ENCOUNTER — Encounter (HOSPITAL_BASED_OUTPATIENT_CLINIC_OR_DEPARTMENT_OTHER): Payer: Self-pay

## 2020-11-04 DIAGNOSIS — M79652 Pain in left thigh: Secondary | ICD-10-CM | POA: Diagnosis present

## 2020-11-04 DIAGNOSIS — M545 Low back pain, unspecified: Secondary | ICD-10-CM | POA: Diagnosis present

## 2020-11-04 DIAGNOSIS — Z20822 Contact with and (suspected) exposure to covid-19: Secondary | ICD-10-CM | POA: Diagnosis not present

## 2020-11-04 DIAGNOSIS — D3500 Benign neoplasm of unspecified adrenal gland: Secondary | ICD-10-CM | POA: Diagnosis not present

## 2020-11-04 DIAGNOSIS — K5720 Diverticulitis of large intestine with perforation and abscess without bleeding: Principal | ICD-10-CM | POA: Diagnosis present

## 2020-11-04 DIAGNOSIS — Z79899 Other long term (current) drug therapy: Secondary | ICD-10-CM

## 2020-11-04 DIAGNOSIS — M62830 Muscle spasm of back: Secondary | ICD-10-CM | POA: Diagnosis not present

## 2020-11-04 DIAGNOSIS — Z7952 Long term (current) use of systemic steroids: Secondary | ICD-10-CM

## 2020-11-04 DIAGNOSIS — D696 Thrombocytopenia, unspecified: Secondary | ICD-10-CM | POA: Diagnosis present

## 2020-11-04 DIAGNOSIS — K5780 Diverticulitis of intestine, part unspecified, with perforation and abscess without bleeding: Secondary | ICD-10-CM | POA: Diagnosis not present

## 2020-11-04 DIAGNOSIS — A599 Trichomoniasis, unspecified: Secondary | ICD-10-CM | POA: Diagnosis not present

## 2020-11-04 DIAGNOSIS — R000 Tachycardia, unspecified: Secondary | ICD-10-CM | POA: Diagnosis not present

## 2020-11-04 DIAGNOSIS — I10 Essential (primary) hypertension: Secondary | ICD-10-CM | POA: Diagnosis not present

## 2020-11-04 DIAGNOSIS — K8020 Calculus of gallbladder without cholecystitis without obstruction: Secondary | ICD-10-CM | POA: Diagnosis not present

## 2020-11-04 DIAGNOSIS — E876 Hypokalemia: Secondary | ICD-10-CM | POA: Diagnosis not present

## 2020-11-04 DIAGNOSIS — K5792 Diverticulitis of intestine, part unspecified, without perforation or abscess without bleeding: Secondary | ICD-10-CM | POA: Diagnosis not present

## 2020-11-04 DIAGNOSIS — Z91013 Allergy to seafood: Secondary | ICD-10-CM | POA: Diagnosis not present

## 2020-11-04 DIAGNOSIS — Z8249 Family history of ischemic heart disease and other diseases of the circulatory system: Secondary | ICD-10-CM

## 2020-11-04 DIAGNOSIS — K6389 Other specified diseases of intestine: Secondary | ICD-10-CM | POA: Diagnosis not present

## 2020-11-04 DIAGNOSIS — K6819 Other retroperitoneal abscess: Secondary | ICD-10-CM | POA: Diagnosis not present

## 2020-11-04 DIAGNOSIS — D62 Acute posthemorrhagic anemia: Secondary | ICD-10-CM | POA: Diagnosis not present

## 2020-11-04 DIAGNOSIS — R197 Diarrhea, unspecified: Secondary | ICD-10-CM | POA: Diagnosis not present

## 2020-11-04 DIAGNOSIS — K651 Peritoneal abscess: Secondary | ICD-10-CM | POA: Diagnosis not present

## 2020-11-04 DIAGNOSIS — A5901 Trichomonal vulvovaginitis: Secondary | ICD-10-CM

## 2020-11-04 DIAGNOSIS — Z8616 Personal history of COVID-19: Secondary | ICD-10-CM | POA: Diagnosis not present

## 2020-11-04 DIAGNOSIS — D259 Leiomyoma of uterus, unspecified: Secondary | ICD-10-CM | POA: Diagnosis not present

## 2020-11-04 DIAGNOSIS — R188 Other ascites: Secondary | ICD-10-CM

## 2020-11-04 DIAGNOSIS — Z880 Allergy status to penicillin: Secondary | ICD-10-CM | POA: Diagnosis not present

## 2020-11-04 LAB — URINALYSIS, ROUTINE W REFLEX MICROSCOPIC
Bilirubin Urine: NEGATIVE
Glucose, UA: NEGATIVE mg/dL
Hgb urine dipstick: NEGATIVE
Ketones, ur: NEGATIVE mg/dL
Nitrite: NEGATIVE
Protein, ur: NEGATIVE mg/dL
Specific Gravity, Urine: <1.005 — ABNORMAL LOW (ref 1.005–1.030)
pH: 5.5 (ref 5.0–8.0)

## 2020-11-04 LAB — URINALYSIS, MICROSCOPIC (REFLEX)

## 2020-11-04 LAB — CBC WITH DIFFERENTIAL/PLATELET
Abs Immature Granulocytes: 0.7 10*3/uL — ABNORMAL HIGH (ref 0.00–0.07)
Basophils Absolute: 0.1 10*3/uL (ref 0.0–0.1)
Basophils Relative: 0 %
Eosinophils Absolute: 0 10*3/uL (ref 0.0–0.5)
Eosinophils Relative: 0 %
HCT: 22.4 % — ABNORMAL LOW (ref 36.0–46.0)
Hemoglobin: 6.9 g/dL — CL (ref 12.0–15.0)
Immature Granulocytes: 3 %
Lymphocytes Relative: 9 %
Lymphs Abs: 2.2 10*3/uL (ref 0.7–4.0)
MCH: 20.7 pg — ABNORMAL LOW (ref 26.0–34.0)
MCHC: 30.8 g/dL (ref 30.0–36.0)
MCV: 67.3 fL — ABNORMAL LOW (ref 80.0–100.0)
Monocytes Absolute: 1.1 10*3/uL — ABNORMAL HIGH (ref 0.1–1.0)
Monocytes Relative: 5 %
Neutro Abs: 19.2 10*3/uL — ABNORMAL HIGH (ref 1.7–7.7)
Neutrophils Relative %: 83 %
Platelets: 1119 10*3/uL (ref 150–400)
RBC: 3.33 MIL/uL — ABNORMAL LOW (ref 3.87–5.11)
RDW: 18 % — ABNORMAL HIGH (ref 11.5–15.5)
WBC: 23.3 10*3/uL — ABNORMAL HIGH (ref 4.0–10.5)
nRBC: 0.1 % (ref 0.0–0.2)

## 2020-11-04 LAB — COMPREHENSIVE METABOLIC PANEL
ALT: 12 U/L (ref 0–44)
AST: 15 U/L (ref 15–41)
Albumin: 2.5 g/dL — ABNORMAL LOW (ref 3.5–5.0)
Alkaline Phosphatase: 66 U/L (ref 38–126)
Anion gap: 16 — ABNORMAL HIGH (ref 5–15)
BUN: 15 mg/dL (ref 6–20)
CO2: 25 mmol/L (ref 22–32)
Calcium: 8.5 mg/dL — ABNORMAL LOW (ref 8.9–10.3)
Chloride: 85 mmol/L — ABNORMAL LOW (ref 98–111)
Creatinine, Ser: 1.43 mg/dL — ABNORMAL HIGH (ref 0.44–1.00)
GFR, Estimated: 49 mL/min — ABNORMAL LOW (ref >60)
Glucose, Bld: 106 mg/dL — ABNORMAL HIGH (ref 70–99)
Potassium: 2.3 mmol/L — CL (ref 3.5–5.1)
Sodium: 126 mmol/L — ABNORMAL LOW (ref 135–145)
Total Bilirubin: 0.4 mg/dL (ref 0.3–1.2)
Total Protein: 7.9 g/dL (ref 6.5–8.1)

## 2020-11-04 LAB — LACTIC ACID, PLASMA
Lactic Acid, Venous: 1.7 mmol/L (ref 0.5–1.9)
Lactic Acid, Venous: 1.7 mmol/L (ref 0.5–1.9)

## 2020-11-04 LAB — LIPASE, BLOOD: Lipase: 21 U/L (ref 11–51)

## 2020-11-04 LAB — MAGNESIUM: Magnesium: 1.5 mg/dL — ABNORMAL LOW (ref 1.7–2.4)

## 2020-11-04 LAB — PREGNANCY, URINE: Preg Test, Ur: NEGATIVE

## 2020-11-04 MED ORDER — DIPHENHYDRAMINE HCL 50 MG/ML IJ SOLN: 12.5000 mg | Freq: Four times a day (QID) | INTRAMUSCULAR | Status: DC | PRN

## 2020-11-04 MED ORDER — KCL IN DEXTROSE-NACL 40-5-0.45 MEQ/L-%-% IV SOLN
INTRAVENOUS | Status: DC
  Filled 2020-11-04 (×5): qty 1000

## 2020-11-04 MED ORDER — AMLODIPINE BESYLATE 10 MG PO TABS
10.0000 mg | ORAL_TABLET | Freq: Every day | ORAL | Status: DC
  Administered 2020-11-04 – 2020-11-10 (×7): 10 mg via ORAL
  Filled 2020-11-04 (×7): qty 1

## 2020-11-04 MED ORDER — SODIUM CHLORIDE 0.9 % IV SOLN
2.0000 g | Freq: Three times a day (TID) | INTRAVENOUS | Status: DC
  Administered 2020-11-04 – 2020-11-10 (×17): 2 g via INTRAVENOUS
  Filled 2020-11-04 (×2): qty 2
  Filled 2020-11-04: qty 1
  Filled 2020-11-04 (×18): qty 2

## 2020-11-04 MED ORDER — POTASSIUM CHLORIDE CRYS ER 20 MEQ PO TBCR
40.0000 meq | EXTENDED_RELEASE_TABLET | Freq: Once | ORAL | Status: AC
  Administered 2020-11-04: 40 meq via ORAL
  Filled 2020-11-04: qty 2

## 2020-11-04 MED ORDER — MAGNESIUM SULFATE 2 GM/50ML IV SOLN
2.0000 g | Freq: Once | INTRAVENOUS | Status: AC
  Administered 2020-11-04: 2 g via INTRAVENOUS
  Filled 2020-11-04: qty 50

## 2020-11-04 MED ORDER — ENOXAPARIN SODIUM 60 MG/0.6ML IJ SOSY
60.0000 mg | PREFILLED_SYRINGE | Freq: Every day | INTRAMUSCULAR | Status: DC
  Administered 2020-11-04 – 2020-11-10 (×7): 60 mg via SUBCUTANEOUS
  Filled 2020-11-04 (×7): qty 0.6

## 2020-11-04 MED ORDER — SODIUM CHLORIDE 0.9 % IV SOLN
1.0000 g | INTRAVENOUS | Status: AC
  Administered 2020-11-04 (×2): 1 g via INTRAVENOUS

## 2020-11-04 MED ORDER — METRONIDAZOLE 500 MG PO TABS
2000.0000 mg | ORAL_TABLET | Freq: Once | ORAL | Status: AC
  Administered 2020-11-05: 2000 mg via ORAL
  Filled 2020-11-04: qty 4

## 2020-11-04 MED ORDER — LACTATED RINGERS IV BOLUS
1000.0000 mL | Freq: Once | INTRAVENOUS | Status: AC
  Administered 2020-11-04: 1000 mL via INTRAVENOUS

## 2020-11-04 MED ORDER — ONDANSETRON HCL 4 MG/2ML IJ SOLN: 4.0000 mg | Freq: Four times a day (QID) | INTRAMUSCULAR | Status: DC | PRN

## 2020-11-04 MED ORDER — MORPHINE SULFATE (PF) 2 MG/ML IV SOLN
2.0000 mg | INTRAVENOUS | Status: DC | PRN
  Administered 2020-11-04: 2 mg via INTRAVENOUS
  Filled 2020-11-04 (×2): qty 1

## 2020-11-04 MED ORDER — POTASSIUM CHLORIDE 10 MEQ/100ML IV SOLN
10.0000 meq | INTRAVENOUS | Status: AC
  Administered 2020-11-04 (×3): 10 meq via INTRAVENOUS
  Filled 2020-11-04 (×3): qty 100

## 2020-11-04 MED ORDER — DIPHENHYDRAMINE HCL 12.5 MG/5ML PO ELIX: 12.5000 mg | ORAL_SOLUTION | Freq: Four times a day (QID) | ORAL | Status: DC | PRN

## 2020-11-04 MED ORDER — SODIUM CHLORIDE 0.9 % IV SOLN: INTRAVENOUS | Status: DC | PRN

## 2020-11-04 MED ORDER — ACETAMINOPHEN 650 MG RE SUPP: 650.0000 mg | Freq: Four times a day (QID) | RECTAL | Status: DC | PRN

## 2020-11-04 MED ORDER — METOPROLOL SUCCINATE ER 25 MG PO TB24
25.0000 mg | ORAL_TABLET | Freq: Two times a day (BID) | ORAL | Status: DC
  Administered 2020-11-04 – 2020-11-10 (×11): 25 mg via ORAL
  Filled 2020-11-04 (×12): qty 1

## 2020-11-04 MED ORDER — ONDANSETRON 4 MG PO TBDP: 4.0000 mg | ORAL_TABLET | Freq: Four times a day (QID) | ORAL | Status: DC | PRN

## 2020-11-04 MED ORDER — MORPHINE SULFATE (PF) 4 MG/ML IV SOLN
4.0000 mg | Freq: Once | INTRAVENOUS | Status: AC
  Administered 2020-11-04: 4 mg via INTRAVENOUS
  Filled 2020-11-04: qty 1

## 2020-11-04 MED ORDER — ACETAMINOPHEN 325 MG PO TABS
650.0000 mg | ORAL_TABLET | Freq: Four times a day (QID) | ORAL | Status: DC | PRN
  Administered 2020-11-04 – 2020-11-07 (×4): 650 mg via ORAL
  Filled 2020-11-04 (×4): qty 2

## 2020-11-04 NOTE — ED Provider Notes (Signed)
Patient arrives in transfer for evaluation by general surgery. When I attempted to see patient surgery was already in the room speaking with her.  Surgery will be admitting patient. I did speak with Dr. Marcello Moores of general surgery, as patient's urine was positive for trichomonas. She states that it is okay for patient to take her 2 g of Flagyl with a single sip or 2 of water. I discussed this result with patient, along with that she will need to inform any partners.  I recommended that when she is recovered she follow-up with OB/GYN for testing of other STIs as we discussed possibility of coinfection with gonorrhea and/or chlamydia and for test of cure. She states her understanding of this recommendation.  Note: Portions of this report may have been transcribed using voice recognition software. Every effort was made to ensure accuracy; however, inadvertent computerized transcription errors may be present      Lorin Glass, PA-C 11/05/20 0046    Horton, Alvin Critchley, DO 11/05/20 1507

## 2020-11-04 NOTE — ED Notes (Signed)
Patient transferred [facility] for surgical consult.  Patient was COVID positive 10/05/2020.

## 2020-11-04 NOTE — ED Triage Notes (Signed)
Pt arrives with c/o recent Covid infection on May 2nd states that she had diarrhea during that time and feels that she may be dehydrated r/t the diarrhea. Pt reports she was getting better and started having diarrhea again. Reports that she has been feeling ike her heart is beating fast and also had decreased PO intake.

## 2020-11-04 NOTE — H&P (Signed)
CC: abd pain, diarrhea  Requesting provider: Dr Dina Rich  HPI: Mikayla Rios is an 35 y.o. female who is here for worsening diarrhea.  She was diagnosed with COVID-19 on May 2.  She had some transient diarrhea associated with this that improved and then shortly after it improved, she began to have diarrhea again associated with crampy abdominal pain and nausea.  She presented to [facility] with worsening symptoms.  CT scan shows perforated diverticulitis of the descending colon.  She has an elevated white blood cell count and anemia.  She is tachycardic and normotensive.    Past Medical History:  Diagnosis Date  . Allergy    seasonal  . Blood infection ([place]) 1985  . Blood transfusion without reported diagnosis   . Hypertension     Past Surgical History:  Procedure Laterality Date  . NO PAST SURGERIES      Family History  Problem Relation Age of Onset  . Cancer Mother        breast, and in remission  . Diabetes Mother   . Hypertension Mother   . Autism Son   . Heart disease Paternal Aunt   . Cancer Paternal Uncle        colon  . Diabetes Maternal Grandmother   . Hypertension Maternal Grandmother   . Diabetes Maternal Grandfather   . Kidney disease Maternal Grandfather   . Diabetes Paternal Grandmother   . Cancer Paternal Grandfather        colon  . Congestive Heart Failure Father   . Hypertension Father     Social:  reports that she has never smoked. She has never used smokeless tobacco. She reports previous alcohol use. She reports that she does not use drugs.  Allergies:  Allergies  Allergen Reactions  . Shellfish Allergy Hives  . Penicillins Hives and Rash    Has patient had a PCN reaction causing immediate rash, facial/tongue/throat swelling, SOB or lightheadedness with hypotension: No Has patient had a PCN reaction causing severe rash involving mucus membranes or skin necrosis: hives Has patient had a PCN reaction that required hospitalization  No Has patient had a PCN reaction occurring within the last 10 years: yes If all of the above answers are "NO", then may proceed with Cephalosporin use.     Medications: I have reviewed the patient's current medications.  Results for orders placed or performed during the hospital encounter of 11/04/20 (from the past 48 hour(s))  Comprehensive metabolic panel     Status: Abnormal   Collection Time: 11/04/20 11:19 AM  Result Value Ref Range   Sodium 126 (L) 135 - 145 mmol/L   Potassium 2.3 (LL) 3.5 - 5.1 mmol/L    Comment: CRITICAL RESULT CALLED TO, READ BACK BY AND VERIFIED WITH: Bernita Raisin, RN AT 1209 ON [id number] BY BOWLBY, J    Chloride 85 (L) 98 - 111 mmol/L   CO2 25 22 - 32 mmol/L   Glucose, Bld 106 (H) 70 - 99 mg/dL    Comment: Glucose reference range applies only to samples taken after fasting for at least 8 hours.   BUN 15 6 - 20 mg/dL   Creatinine, Ser 1.43 (H) 0.44 - 1.00 mg/dL   Calcium 8.5 (L) 8.9 - 10.3 mg/dL   Total Protein 7.9 6.5 - 8.1 g/dL   Albumin 2.5 (L) 3.5 - 5.0 g/dL   AST 15 15 - 41 U/L   ALT 12 0 - 44 U/L   Alkaline Phosphatase  66 38 - 126 U/L   Total Bilirubin 0.4 0.3 - 1.2 mg/dL   GFR, Estimated 49 (L) >60 mL/min    Comment: (NOTE) Calculated using the CKD-EPI Creatinine Equation (2021)    Anion gap 16 (H) 5 - 15    Comment: Performed at [facility], [address] [place], [place] [address]  Lipase, blood     Status: None   Collection Time: 11/04/20 11:19 AM  Result Value Ref Range   Lipase 21 11 - 51 U/L    Comment: Performed at [facility], [address] [place], [place] [address]  CBC with Differential     Status: Abnormal   Collection Time: 11/04/20 11:19 AM  Result Value Ref Range   WBC 23.3 (H) 4.0 - 10.5 K/uL   RBC 3.33 (L) 3.87 - 5.11 MIL/uL   Hemoglobin 6.9 (LL) 12.0 - 15.0 g/dL    Comment: REPEATED TO VERIFY Reticulocyte Hemoglobin testing may be clinically indicated, consider ordering this additional test  [id number] THIS CRITICAL RESULT HAS VERIFIED AND BEEN CALLED TO JAMIE BAILEY BY JOHANE GARCON ON 05 31 2022 AT 8242, AND HAS BEEN READ BACK. CALLED AT 1153 BY JAG    HCT 22.4 (L) 36.0 - 46.0 %   MCV 67.3 (L) 80.0 - 100.0 fL   MCH 20.7 (L) 26.0 - 34.0 pg   MCHC 30.8 30.0 - 36.0 g/dL   RDW 18.0 (H) 11.5 - 15.5 %   Platelets 1,119 (HH) 150 - 400 K/uL    Comment: SPECIMEN CHECKED FOR CLOTS REPEATED TO VERIFY THIS CRITICAL RESULT HAS VERIFIED AND BEEN CALLED TO JAMIE BAILEY BY JOHANE GARCON ON 05 31 2022 AT 3536, AND HAS BEEN READ BACK. CALLED AT 1153 BY JAG    nRBC 0.1 0.0 - 0.2 %   Neutrophils Relative % 83 %   Neutro Abs 19.2 (H) 1.7 - 7.7 K/uL   Lymphocytes Relative 9 %   Lymphs Abs 2.2 0.7 - 4.0 K/uL   Monocytes Relative 5 %   Monocytes Absolute 1.1 (H) 0.1 - 1.0 K/uL   Eosinophils Relative 0 %   Eosinophils Absolute 0.0 0.0 - 0.5 K/uL   Basophils Relative 0 %   Basophils Absolute 0.1 0.0 - 0.1 K/uL   WBC Morphology MORPHOLOGY UNREMARKABLE    Smear Review PLATELET COUNT CONFIRMED BY SMEAR    Immature Granulocytes 3 %   Abs Immature Granulocytes 0.70 (H) 0.00 - 0.07 K/uL   Polychromasia PRESENT    Ovalocytes PRESENT    Stomatocytes PRESENT    Giant PLTs PRESENT     Comment: Performed at [facility], [address] [place], [place] [address]  Pregnancy, urine     Status: None   Collection Time: 11/04/20 11:19 AM  Result Value Ref Range   Preg Test, Ur NEGATIVE NEGATIVE    Comment:        THE SENSITIVITY OF THIS METHODOLOGY IS >20 mIU/mL. Performed at [facility], [address] [place], [place] [address]   Urinalysis, Routine w reflex microscopic Urine, Clean Catch     Status: Abnormal   Collection Time: 11/04/20 11:19 AM  Result Value Ref Range   Color, Urine YELLOW YELLOW   APPearance CLEAR CLEAR   Specific Gravity, Urine <1.005 (L) 1.005 - 1.030   pH 5.5 5.0 - 8.0   Glucose, UA NEGATIVE NEGATIVE mg/dL   Hgb urine dipstick NEGATIVE NEGATIVE    Bilirubin Urine  NEGATIVE NEGATIVE   Ketones, ur NEGATIVE NEGATIVE mg/dL   Protein, ur NEGATIVE NEGATIVE mg/dL   Nitrite NEGATIVE NEGATIVE   Leukocytes,Ua LARGE (A) NEGATIVE    Comment: Performed at [facility], [address] [place], [place] [address]  Magnesium     Status: Abnormal   Collection Time: 11/04/20 11:19 AM  Result Value Ref Range   Magnesium 1.5 (L) 1.7 - 2.4 mg/dL    Comment: Performed at [facility], [address] [place], [place] [address]  Urinalysis, Microscopic (reflex)     Status: Abnormal   Collection Time: 11/04/20 11:19 AM  Result Value Ref Range   RBC / HPF 0-5 0 - 5 RBC/hpf   WBC, UA 21-50 0 - 5 WBC/hpf   Bacteria, UA MANY (A) NONE SEEN   Squamous Epithelial / LPF 6-10 0 - 5   Trichomonas, UA PRESENT (A) NONE SEEN    Comment: Performed at [facility], [address] [place], [place] [address]  Lactic acid, plasma     Status: None   Collection Time: 11/04/20  1:37 PM  Result Value Ref Range   Lactic Acid, Venous 1.7 0.5 - 1.9 mmol/L    Comment: Performed at [facility], [address] [place], [place] [address]    CT Abdomen Pelvis Wo Contrast  Result Date: 11/04/2020 CLINICAL DATA:  Abdominal pain diverticulitis suspected. EXAM: CT ABDOMEN AND PELVIS WITHOUT CONTRAST TECHNIQUE: Multidetector CT imaging of the abdomen and pelvis was performed following the standard protocol without IV contrast. COMPARISON:  None. FINDINGS: Lower chest: No acute abnormality. Hepatobiliary: Unremarkable noncontrast appearance of the hepatic parenchyma. Cholelithiasis measuring up to approximately 1 cm without findings of acute cholecystitis. No biliary ductal dilation. Pancreas: Within normal limits. Spleen: Within normal limits. Adrenals/Urinary Tract: Hypodense 3.6 cm left adrenal lesion, imaging characteristics consistent with a benign adrenal adenoma. No hydronephrosis. Hypodense 2.9 cm left lower pole renal cyst. Urinary  bladder is grossly unremarkable for degree of distension. Stomach/Bowel: Left-sided colonic diverticulosis. There is short segment wall thickening of the descending colon with adjacent fluid/inflammatory stranding and multiple punctate foci of gas. Multiple abdominal fluid collections including a 5.7 x 3.7 cm intraperitoneal gas and fluid collection anterior to the sigmoid colon on image 56/2, a 3.7 cm retroperitoneal gas and fluid collection on image 51/2 and a 3.6 x 3.0 cm gas and fluid collection within the left psoas muscle. Vascular/Lymphatic: No significant vascular findings are present. No enlarged abdominal or pelvic lymph nodes. Reproductive: Leiomyomatous uterus.  No suspicious adnexal masses. Other: Abdominal gas and fluid collections as described above. Musculoskeletal: No acute or significant osseous findings. 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 Perforating descending colonic diverticulitis with multiple abdominopelvic gas and fluid collections, measuring up to 5.7 cm and further described above. 2. Cholelithiasis without findings of acute cholecystitis. 3. 3.6 cm benign left adrenal adenoma. 4. Leiomyomatous uterus. 5. 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 These results were called by telephone at the time of interpretation on 11/04/2020 at 1:17 pm to provider [facility] , who verbally acknowledged these results. Electronically Signed   By: Dahlia Bailiff MD   On: 11/04/2020 13:19    ROS - all of the below systems have been reviewed with the patient and positives  are indicated with bold text General: chills, fever or night sweats Eyes: blurry vision or double vision ENT: epistaxis or sore throat Allergy/Immunology: itchy/watery eyes or nasal congestion Hematologic/Lymphatic:  bleeding problems, blood clots or swollen lymph nodes Endocrine: temperature intolerance or unexpected weight changes Breast: new or changing breast lumps or nipple discharge Resp: cough, shortness of breath, or wheezing CV: chest pain or dyspnea on exertion GI: as per HPI GU: dysuria, trouble voiding, or hematuria MSK: joint pain or joint stiffness Neuro: TIA or stroke symptoms Derm: pruritus and skin lesion changes Psych: anxiety and depression  PE Blood pressure 136/74, pulse (!) 132, temperature 98.5 F (36.9 C), temperature source Oral, resp. rate (!) 26, height 5\' 3"  (1.6 m), weight 120.2 kg, last menstrual period 10/21/2020, SpO2 100 %. Constitutional: NAD; conversant; no deformities Eyes: Moist conjunctiva; no lid lag; anicteric; PERRL Neck: Trachea midline; no thyromegaly Lungs: Normal respiratory effort; no tactile fremitus CV: RRR; no palpable thrills; no pitting edema GI: Abd soft, nontender; no palpable hepatosplenomegaly MSK: Normal range of motion of extremities; no clubbing/cyanosis Psychiatric: Appropriate affect; alert and oriented x3 Lymphatic: No palpable cervical or axillary lymphadenopathy  Results for orders placed or performed during the hospital encounter of 11/04/20 (from the past 48 hour(s))  Comprehensive metabolic panel     Status: Abnormal   Collection Time: 11/04/20 11:19 AM  Result Value Ref Range   Sodium 126 (L) 135 - 145 mmol/L   Potassium 2.3 (LL) 3.5 - 5.1 mmol/L    Comment: CRITICAL RESULT CALLED TO, READ BACK BY AND VERIFIED WITH: Bernita Raisin, RN AT 1209 ON [id number] BY BOWLBY, J    Chloride 85 (L) 98 - 111 mmol/L   CO2 25 22 - 32 mmol/L   Glucose, Bld 106 (H) 70 - 99 mg/dL    Comment: Glucose reference range applies only to samples taken after fasting for at least 8 hours.   BUN 15 6 - 20 mg/dL   Creatinine, Ser 1.43 (H) 0.44 - 1.00 mg/dL   Calcium 8.5 (L) 8.9 - 10.3 mg/dL   Total Protein 7.9 6.5 - 8.1 g/dL   Albumin 2.5 (L) 3.5 - 5.0  g/dL   AST 15 15 - 41 U/L   ALT 12 0 - 44 U/L   Alkaline Phosphatase 66 38 - 126 U/L   Total Bilirubin 0.4 0.3 - 1.2 mg/dL   GFR, Estimated 49 (L) >60 mL/min    Comment: (NOTE) Calculated using the CKD-EPI Creatinine Equation (2021)    Anion gap 16 (H) 5 - 15    Comment: Performed at [facility], [address] [place], [place] [address]  Lipase, blood     Status: None   Collection Time: 11/04/20 11:19 AM  Result Value Ref Range   Lipase 21 11 - 51 U/L    Comment: Performed at [facility], [address] [place], [place] [address]  CBC with Differential     Status: Abnormal   Collection Time: 11/04/20 11:19 AM  Result Value Ref Range   WBC 23.3 (H) 4.0 - 10.5 K/uL   RBC 3.33 (L) 3.87 - 5.11 MIL/uL   Hemoglobin 6.9 (LL) 12.0 - 15.0 g/dL    Comment: REPEATED TO VERIFY Reticulocyte Hemoglobin testing may be clinically indicated, consider ordering this additional test [id number] THIS CRITICAL RESULT HAS VERIFIED AND BEEN CALLED TO JAMIE BAILEY BY JOHANE GARCON ON 05 31 2022 AT 8315, AND HAS BEEN READ BACK. CALLED AT 1153 BY JAG  HCT 22.4 (L) 36.0 - 46.0 %   MCV 67.3 (L) 80.0 - 100.0 fL   MCH 20.7 (L) 26.0 - 34.0 pg   MCHC 30.8 30.0 - 36.0 g/dL   RDW 18.0 (H) 11.5 - 15.5 %   Platelets 1,119 (HH) 150 - 400 K/uL    Comment: SPECIMEN CHECKED FOR CLOTS REPEATED TO VERIFY THIS CRITICAL RESULT HAS VERIFIED AND BEEN CALLED TO JAMIE BAILEY BY JOHANE GARCON ON 05 31 2022 AT 2703, AND HAS BEEN READ BACK. CALLED AT 1153 BY JAG    nRBC 0.1 0.0 - 0.2 %   Neutrophils Relative % 83 %   Neutro Abs 19.2 (H) 1.7 - 7.7 K/uL   Lymphocytes Relative 9 %   Lymphs Abs 2.2 0.7 - 4.0 K/uL   Monocytes Relative 5 %   Monocytes Absolute 1.1 (H) 0.1 - 1.0 K/uL   Eosinophils Relative 0 %   Eosinophils Absolute 0.0 0.0 - 0.5 K/uL   Basophils Relative 0 %   Basophils Absolute 0.1 0.0 - 0.1 K/uL   WBC Morphology MORPHOLOGY UNREMARKABLE    Smear Review PLATELET COUNT CONFIRMED BY  SMEAR    Immature Granulocytes 3 %   Abs Immature Granulocytes 0.70 (H) 0.00 - 0.07 K/uL   Polychromasia PRESENT    Ovalocytes PRESENT    Stomatocytes PRESENT    Giant PLTs PRESENT     Comment: Performed at [facility], [address] [place], [place] [address]  Pregnancy, urine     Status: None   Collection Time: 11/04/20 11:19 AM  Result Value Ref Range   Preg Test, Ur NEGATIVE NEGATIVE    Comment:        THE SENSITIVITY OF THIS METHODOLOGY IS >20 mIU/mL. Performed at [facility], [address] [place], [place] [address]   Urinalysis, Routine w reflex microscopic Urine, Clean Catch     Status: Abnormal   Collection Time: 11/04/20 11:19 AM  Result Value Ref Range   Color, Urine YELLOW YELLOW   APPearance CLEAR CLEAR   Specific Gravity, Urine <1.005 (L) 1.005 - 1.030   pH 5.5 5.0 - 8.0   Glucose, UA NEGATIVE NEGATIVE mg/dL   Hgb urine dipstick NEGATIVE NEGATIVE   Bilirubin Urine NEGATIVE NEGATIVE   Ketones, ur NEGATIVE NEGATIVE mg/dL   Protein, ur NEGATIVE NEGATIVE mg/dL   Nitrite NEGATIVE NEGATIVE   Leukocytes,Ua LARGE (A) NEGATIVE    Comment: Performed at [facility], [address] [place], [place] [address]  Magnesium     Status: Abnormal   Collection Time: 11/04/20 11:19 AM  Result Value Ref Range   Magnesium 1.5 (L) 1.7 - 2.4 mg/dL    Comment: Performed at [facility], [address] [place], [place] [address]  Urinalysis, Microscopic (reflex)     Status: Abnormal   Collection Time: 11/04/20 11:19 AM  Result Value Ref Range   RBC / HPF 0-5 0 - 5 RBC/hpf   WBC, UA 21-50 0 - 5 WBC/hpf   Bacteria, UA MANY (A) NONE SEEN   Squamous Epithelial / LPF 6-10 0 - 5   Trichomonas, UA PRESENT (A) NONE SEEN    Comment: Performed at [facility], [address] [place], [place] [address]  Lactic acid, plasma     Status: None   Collection Time: 11/04/20  1:37 PM  Result Value Ref Range   Lactic Acid, Venous 1.7  0.5 - 1.9 mmol/L  Comment: Performed at [facility], [address] [place], [place] [address]    CT Abdomen Pelvis Wo Contrast  Result Date: 11/04/2020 CLINICAL DATA:  Abdominal pain diverticulitis suspected. EXAM: CT ABDOMEN AND PELVIS WITHOUT CONTRAST TECHNIQUE: Multidetector CT imaging of the abdomen and pelvis was performed following the standard protocol without IV contrast. COMPARISON:  None. FINDINGS: Lower chest: No acute abnormality. Hepatobiliary: Unremarkable noncontrast appearance of the hepatic parenchyma. Cholelithiasis measuring up to approximately 1 cm without findings of acute cholecystitis. No biliary ductal dilation. Pancreas: Within normal limits. Spleen: Within normal limits. Adrenals/Urinary Tract: Hypodense 3.6 cm left adrenal lesion, imaging characteristics consistent with a benign adrenal adenoma. No hydronephrosis. Hypodense 2.9 cm left lower pole renal cyst. Urinary bladder is grossly unremarkable for degree of distension. Stomach/Bowel: Left-sided colonic diverticulosis. There is short segment wall thickening of the descending colon with adjacent fluid/inflammatory stranding and multiple punctate foci of gas. Multiple abdominal fluid collections including a 5.7 x 3.7 cm intraperitoneal gas and fluid collection anterior to the sigmoid colon on image 56/2, a 3.7 cm retroperitoneal gas and fluid collection on image 51/2 and a 3.6 x 3.0 cm gas and fluid collection within the left psoas muscle. Vascular/Lymphatic: No significant vascular findings are present. No enlarged abdominal or pelvic lymph nodes. Reproductive: Leiomyomatous uterus.  No suspicious adnexal masses. Other: Abdominal gas and fluid collections as described above. Musculoskeletal: No acute or significant osseous findings. 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 Perforating descending colonic  diverticulitis with multiple abdominopelvic gas and fluid collections, measuring up to 5.7 cm and further described above. 2. Cholelithiasis without findings of acute cholecystitis. 3. 3.6 cm benign left adrenal adenoma. 4. Leiomyomatous uterus. 5. 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 These results were called by telephone at the time of interpretation on 11/04/2020 at 1:17 pm to provider [facility] , who verbally acknowledged these results. Electronically Signed   By: Dahlia Bailiff MD   On: 11/04/2020 13:19     A/P: Mikayla Rios is an 35 y.o. female with perforated diverticulitis.  We will admit her to the floor and start IV antibiotics.  We will keep her n.p.o.  IV fluids for rehydration. Diarrhea.  Presumably from perforated diverticulitis  Anemia.  Unclear etiology at this point.  She appears stable for now and we will hold off on transfusion unless it drops further. Hypokalemia due to diarrhea: IV replacements have been started.  Recheck in a.m.  Add extra potassium to IV fluids.   Rosario Adie, MD  [facility]

## 2020-11-04 NOTE — Progress Notes (Addendum)
Patient is a yellow MEWs because HR is between 120's-130's since she arrived to unit earlier today. Yellow MEWS protocol initiated. Last temp is 100.7 F, PRN tylenol was given. Patient is stable at this time.

## 2020-11-04 NOTE — ED Notes (Signed)
ED Provider at bedside. 

## 2020-11-04 NOTE — ED Provider Notes (Signed)
[facility] EMERGENCY DEPARTMENT Provider Note   CSN: [id number] Arrival date & time: 11/04/20  1010     History Chief Complaint  Patient presents with  . Abdominal Pain    Mikayla Rios is a 35 y.o. female.  The history is provided by the patient and medical records.  Abdominal Pain  Mikayla Rios is a 35 y.o. female who presents to the Emergency Department complaining of diarrhea.  She was diagnosed with covid 19 on May 2.  She had transient diarrhea with the covid diagnosis, which improved.  The diarrhea returned shortly after it improved and is now having 7-8 watery brown/green BMs daily.  Has nausea with poor oral intake.  Has leg cramps.    No CP.  Has SOB with exertion.  No dysuria. Has a hx/o HTN. No recent abx.    No fever.  Saw pcp and was started and bentyl for gas pain, which helped.  She has abdominal cramping just prior and during BMs.  She is now having spasms of back and abdomen since yesterday.     Past Medical History:  Diagnosis Date  . Allergy    seasonal  . Blood infection ([place]) 1985  . Blood transfusion without reported diagnosis   . Hypertension     Patient Active Problem List   Diagnosis Date Noted  . Absolute anemia 07/11/2019  . Tachycardia 07/11/2019  . OSA on CPAP 11/27/2018  . Class 3 severe obesity due to excess calories with serious comorbidity and body mass index (BMI) of 60.0 to 69.9 in adult [facility])   . SVD (spontaneous vaginal delivery) 12/16/2012    Past Surgical History:  Procedure Laterality Date  . NO PAST SURGERIES       OB History    Gravida  3   Para  3   Term  3   Preterm  0   AB  0   Living  3     SAB  0   IAB  0   Ectopic  0   Multiple  0   Live Births  3           Family History  Problem Relation Age of Onset  . Cancer Mother        breast, and in remission  . Diabetes Mother   . Hypertension Mother   . Autism Son   . Heart disease Paternal Aunt   . Cancer Paternal Uncle         colon  . Diabetes Maternal Grandmother   . Hypertension Maternal Grandmother   . Diabetes Maternal Grandfather   . Kidney disease Maternal Grandfather   . Diabetes Paternal Grandmother   . Cancer Paternal Grandfather        colon  . Congestive Heart Failure Father   . Hypertension Father     Social History   Tobacco Use  . Smoking status: Never Smoker  . Smokeless tobacco: Never Used  Vaping Use  . Vaping Use: Never used  Substance Use Topics  . Alcohol use: Not Currently  . Drug use: No    Home Medications Prior to Admission medications   Medication Sig Start Date End Date Taking? Authorizing Provider  amLODipine (NORVASC) 10 MG tablet Take 1 tablet (10 mg total) by mouth daily. 01/02/20   [name], MD  benzonatate (TESSALON PERLES) 100 MG capsule Take 1 capsule (100 mg total) by mouth every 6 (six) hours as needed. 10/06/20 10/06/21  Minette Brine,  FNP  dicyclomine (BENTYL) 10 MG capsule Take 1 capsule (10 mg total) by mouth 4 (four) times daily for 14 days. 10/23/20 11/06/20  Minette Brine, FNP  lisinopril-hydrochlorothiazide (ZESTORETIC) 20-12.5 MG tablet Take 2 tablets by mouth daily. 11/23/19   [name], MD  metoprolol succinate (TOPROL-XL) 25 MG 24 hr tablet Take 1 tablet (25 mg total) by mouth 2 (two) times daily. 05/15/20   Bary Castilla, NP  predniSONE (DELTASONE) 20 MG tablet Take 2 tablets by mouth daily x 3 days 10/06/20   Minette Brine, FNP    Allergies    Shellfish allergy and Penicillins  Review of Systems   Review of Systems  Gastrointestinal: Positive for abdominal pain.  All other systems reviewed and are negative.   Physical Exam Updated Vital Signs BP 121/81 (BP Location: Left Wrist)   Pulse (!) 140   Temp 99.3 F (37.4 C)   Resp 20   Ht 5\' 3"  (1.6 m)   Wt 120.2 kg   LMP 10/21/2020   SpO2 100%   BMI 46.94 kg/m   Physical Exam Vitals and nursing note reviewed.  Constitutional:      Appearance: She is  well-developed.  HENT:     Head: Normocephalic and atraumatic.  Cardiovascular:     Rate and Rhythm: Regular rhythm. Tachycardia present.     Heart sounds: No murmur heard.   Pulmonary:     Effort: Pulmonary effort is normal. No respiratory distress.     Breath sounds: Normal breath sounds.  Abdominal:     Palpations: Abdomen is soft.     Tenderness: There is no guarding or rebound.     Comments: Mild generalized abdominal tenderness  Musculoskeletal:        General: No tenderness.  Skin:    General: Skin is warm and dry.  Neurological:     Mental Status: She is alert and oriented to person, place, and time.  Psychiatric:        Behavior: Behavior normal.     ED Results / Procedures / Treatments   Labs (all labs ordered are listed, but only abnormal results are displayed) Labs Reviewed - No data to display  EKG EKG Interpretation  Date/Time:  Tuesday Nov 04 2020 10:21:21 EDT Ventricular Rate:  138 PR Interval:  152 QRS Duration: 100 QT Interval:  268 QTC Calculation: 406 R Axis:   65 Text Interpretation: Sinus tachycardia Nonspecific ST and T wave abnormality Abnormal ECG Confirmed by Quintella Reichert [phone]) on 11/04/2020 10:24:13 AM   Radiology No results found.  Procedures Procedures  CRITICAL CARE Performed by: Quintella Reichert   Total critical care time: 45 minutes  Critical care time was exclusive of separately billable procedures and treating other patients.  Critical care was necessary to treat or prevent imminent or life-threatening deterioration.  Critical care was time spent personally by me on the following activities: development of treatment plan with patient and/or surrogate as well as nursing, discussions with consultants, evaluation of patient's response to treatment, examination of patient, obtaining history from patient or surrogate, ordering and performing treatments and interventions, ordering and review of laboratory studies, ordering and  review of radiographic studies, pulse oximetry and re-evaluation of patient's condition.  Medications Ordered in ED Medications - No data to display  ED Course  I have reviewed the triage vital signs and the nursing notes.  Pertinent labs & imaging results that were available during my care of the patient were reviewed by me and considered in my  medical decision making (see chart for details).    MDM Rules/Calculators/A&P                          patient with history of hypertension, sinus tachycardia and COVID-19 early May here for evaluation of diarrhea for the last three weeks. She has mild tenderness on examination. She states that her tenderness is minimal but she does get balance of cramping associated with bowel movement. Labs significant for anemia to 6.9, leukocytosis, thrombocytopenia, hypokalemia, hypomag. She was treated with IV fluid hydration, electrolyte replacement. CT scan was obtained, which is concerning for appropriate diverticulitis with intra-abdominal abscess. Will start on antibiotics. Given her penicillin allergy will treat with merropenem  Discussed with Dr. Marcello Moores with general surgery. Will transfer the patient ED to ED so she can assess the patient in person. Discuss[facility] with [facility] Zenia [facility]ides at the [facility] who accepts the patient in transfer.   Final Clinical Impression(s) / ED Diagnoses Final diagnoses:  Acute diverticulitis  Hypokalemia  Hypomagnesemia    Rx / DC Orders ED Discharge Orders    None       Quintella Reichert, MD 11/04/20 1515

## 2020-11-04 NOTE — Progress Notes (Signed)
Pharmacy Antibiotic Note  Mikayla Rios is a 35 y.o. female admitted on 11/04/2020 with intra-abdominal infection.  Pharmacy has been consulted for meropenem dosing.  Plan: Meropenem 2 grams IV every 8 hours  Height: 5\' 3"  (160 cm) Weight: 120.2 kg (265 lb) IBW/kg (Calculated) : 52.4  Temp (24hrs), Avg:99.3 F (37.4 C), Min:99.3 F (37.4 C), Max:99.3 F (37.4 C)  Recent Labs  Lab 11/04/20 1119  WBC 23.3*  CREATININE 1.43*    Estimated Creatinine Clearance: 68.3 mL/min (A) (by C-G formula based on SCr of 1.43 mg/dL (H)).    Allergies  Allergen Reactions  . Shellfish Allergy Hives  . Penicillins Hives and Rash    Has patient had a PCN reaction causing immediate rash, facial/tongue/throat swelling, SOB or lightheadedness with hypotension: No Has patient had a PCN reaction causing severe rash involving mucus membranes or skin necrosis: hives Has patient had a PCN reaction that required hospitalization No Has patient had a PCN reaction occurring within the last 10 years: yes If all of the above answers are "NO", then may proceed with Cephalosporin use.     Antimicrobials this admission: Meropenem 2 gm IV x 1 dose   Microbiology results: pending  Thank you for allowing pharmacy to be a part of this patient's care.  Mallie Mussel A Saraia Platner 11/04/2020 1:50 PM

## 2020-11-05 ENCOUNTER — Ambulatory Visit: Payer: Self-pay | Admitting: Nurse Practitioner

## 2020-11-05 ENCOUNTER — Encounter: Payer: Self-pay | Admitting: Internal Medicine

## 2020-11-05 LAB — CBC
HCT: 19.9 % — ABNORMAL LOW (ref 36.0–46.0)
Hemoglobin: 5.9 g/dL — CL (ref 12.0–15.0)
MCH: 20.5 pg — ABNORMAL LOW (ref 26.0–34.0)
MCHC: 29.6 g/dL — ABNORMAL LOW (ref 30.0–36.0)
MCV: 69.1 fL — ABNORMAL LOW (ref 80.0–100.0)
Platelets: 793 10*3/uL — ABNORMAL HIGH (ref 150–400)
RBC: 2.88 MIL/uL — ABNORMAL LOW (ref 3.87–5.11)
RDW: 18.2 % — ABNORMAL HIGH (ref 11.5–15.5)
WBC: 19.5 10*3/uL — ABNORMAL HIGH (ref 4.0–10.5)
nRBC: 0 % (ref 0.0–0.2)

## 2020-11-05 LAB — GASTROINTESTINAL PANEL BY PCR, STOOL (REPLACES STOOL CULTURE)

## 2020-11-05 LAB — BASIC METABOLIC PANEL
Anion gap: 8 (ref 5–15)
BUN: 10 mg/dL (ref 6–20)
CO2: 28 mmol/L (ref 22–32)
Calcium: 8.4 mg/dL — ABNORMAL LOW (ref 8.9–10.3)
Chloride: 98 mmol/L (ref 98–111)
Creatinine, Ser: 0.85 mg/dL (ref 0.44–1.00)
GFR, Estimated: >60 mL/min (ref >60)
Glucose, Bld: 115 mg/dL — ABNORMAL HIGH (ref 70–99)
Potassium: 3.5 mmol/L (ref 3.5–5.1)
Sodium: 134 mmol/L — ABNORMAL LOW (ref 135–145)

## 2020-11-05 LAB — C DIFFICILE QUICK SCREEN W PCR REFLEX
C Diff antigen: NEGATIVE
C Diff interpretation: NOT DETECTED
C Diff toxin: NEGATIVE

## 2020-11-05 LAB — SARS CORONAVIRUS 2 (TAT 6-24 HRS): SARS Coronavirus 2: NEGATIVE

## 2020-11-05 LAB — PREPARE RBC (CROSSMATCH)

## 2020-11-05 MED ORDER — SODIUM CHLORIDE 0.9% IV SOLUTION: Freq: Once | INTRAVENOUS | Status: AC

## 2020-11-05 NOTE — Progress Notes (Signed)
Critical lab of hemoglobin 5.9. MD Stechschulte was paged.

## 2020-11-05 NOTE — Progress Notes (Addendum)
Progress Note     Subjective: CC: worsened anemia overnight and currently getting transfusion. Her abdominal pain has greatly improved. BMs yesterday and today and pain improved with these as well. She denies nausea or emesis. She has had baseline anemia for a longtime and does not know a particular etiology. She denies hematochezia or melena  Objective: Vital signs in last 24 hours: Temp:  [98.4 F (36.9 C)-100.7 F (38.2 C)] 99.1 F (37.3 C) (06/01 0543) Pulse Rate:  [109-144] 109 (06/01 0137) Resp:  [17-26] 17 (06/01 0543) BP: (106-138)/(61-94) 106/67 (06/01 0543) SpO2:  [98 %-100 %] 100 % (06/01 0543) Weight:  [120.2 kg] 120.2 kg (05/31 1019) Last BM Date: 11/04/20  Intake/Output from previous day: 05/31 0701 - 06/01 0700 In: 2844 [P.O.:60; I.V.:963.3; IV Piggyback:1820.7] Out: -  Intake/Output this shift: Total I/O In: 1183.1 [P.O.:60; I.V.:962.8; IV Piggyback:160.3] Out: -   PE: General: pleasant, WD, female who is laying in bed in NAD HEENT: head is normocephalic, atraumatic. Mouth is pink and moist Heart: regular, rate, and rhythm.  Palpable radial pulses bilaterally Lungs: CTAB, no wheezes, rhonchi, or rales noted.  Respiratory effort nonlabored Abd: soft, ND, +BS, no tenderness to palpation MS: all 4 extremities are symmetrical with no cyanosis, clubbing, or edema. No calf TTP Skin: warm and dry with no masses, lesions, or rashes Psych: A&Ox3 with an appropriate affect.    Lab Results:  Recent Labs    11/04/20 1119 11/05/20 0429  WBC 23.3* 19.5*  HGB 6.9* 5.9*  HCT 22.4* 19.9*  PLT 1,119* 793*   BMET Recent Labs    11/04/20 1119 11/05/20 0429  NA 126* 134*  K 2.3* 3.5  CL 85* 98  CO2 25 28  GLUCOSE 106* 115*  BUN 15 10  CREATININE 1.43* 0.85  CALCIUM 8.5* 8.4*   PT/INR No results for input(s): LABPROT, INR in the last 72 hours. CMP     Component Value Date/Time   NA 134 (L) 11/05/2020 0429   NA 139 12/21/2019 1024   K 3.5 11/05/2020  0429   CL 98 11/05/2020 0429   CO2 28 11/05/2020 0429   GLUCOSE 115 (H) 11/05/2020 0429   BUN 10 11/05/2020 0429   BUN 13 12/21/2019 1024   CREATININE 0.85 11/05/2020 0429   CALCIUM 8.4 (L) 11/05/2020 0429   PROT 7.9 11/04/2020 1119   PROT 7.4 07/11/2019 1638   ALBUMIN 2.5 (L) 11/04/2020 1119   ALBUMIN 4.6 07/11/2019 1638   AST 15 11/04/2020 1119   ALT 12 11/04/2020 1119   ALKPHOS 66 11/04/2020 1119   BILITOT 0.4 11/04/2020 1119   BILITOT 0.2 07/11/2019 1638   GFRNONAA >60 11/05/2020 0429   GFRAA 96 12/21/2019 1024   Lipase     Component Value Date/Time   LIPASE 21 11/04/2020 1119       Studies/Results: CT Abdomen Pelvis Wo Contrast  Result Date: 11/04/2020 CLINICAL DATA:  Abdominal pain diverticulitis suspected. EXAM: CT ABDOMEN AND PELVIS WITHOUT CONTRAST TECHNIQUE: Multidetector CT imaging of the abdomen and pelvis was performed following the standard protocol without IV contrast. COMPARISON:  None. FINDINGS: Lower chest: No acute abnormality. Hepatobiliary: Unremarkable noncontrast appearance of the hepatic parenchyma. Cholelithiasis measuring up to approximately 1 cm without findings of acute cholecystitis. No biliary ductal dilation. Pancreas: Within normal limits. Spleen: Within normal limits. Adrenals/Urinary Tract: Hypodense 3.6 cm left adrenal lesion, imaging characteristics consistent with a benign adrenal adenoma. No hydronephrosis. Hypodense 2.9 cm left lower pole renal cyst. Urinary bladder is grossly  unremarkable for degree of distension. Stomach/Bowel: Left-sided colonic diverticulosis. There is short segment wall thickening of the descending colon with adjacent fluid/inflammatory stranding and multiple punctate foci of gas. Multiple abdominal fluid collections including a 5.7 x 3.7 cm intraperitoneal gas and fluid collection anterior to the sigmoid colon on image 56/2, a 3.7 cm retroperitoneal gas and fluid collection on image 51/2 and a 3.6 x 3.0 cm gas and fluid  collection within the left psoas muscle. Vascular/Lymphatic: No significant vascular findings are present. No enlarged abdominal or pelvic lymph nodes. Reproductive: Leiomyomatous uterus.  No suspicious adnexal masses. Other: Abdominal gas and fluid collections as described above. Musculoskeletal: No acute or significant osseous findings. 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 Perforating descending colonic diverticulitis with multiple abdominopelvic gas and fluid collections, measuring up to 5.7 cm and further described above. 2. Cholelithiasis without findings of acute cholecystitis. 3. 3.6 cm benign left adrenal adenoma. 4. Leiomyomatous uterus. 5. 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 These results were called by telephone at the time of interpretation on 11/04/2020 at 1:17 pm to provider [facility] , who verbally acknowledged these results. Electronically Signed   By: Dahlia Bailiff MD   On: 11/04/2020 13:19    Anti-infectives: Anti-infectives (From admission, onward)   Start     Dose/Rate Route Frequency Ordered Stop   11/04/20 2200  meropenem (MERREM) 2 g in sodium chloride 0.9 % 100 mL IVPB        2 g 200 mL/hr over 30 Minutes Intravenous Every 8 hours 11/04/20 1353     11/04/20 1730  metroNIDAZOLE (FLAGYL) tablet 2,000 mg        2,000 mg Oral  Once 11/04/20 1717     11/04/20 1400  meropenem (MERREM) 1 g in sodium chloride 0.9 % 100 mL IVPB        1 g 200 mL/hr over 30 Minutes Intravenous Every 1 hr x 2 11/04/20 1342 11/04/20 1521       Assessment/Plan Anemia - 6.9 >5.9 - transfusing 2 units PRBCs today. Monitor hgb Hypokalemia - resolved s/p replacement, montior  Perforated diverticulitis - WBC improving and nontender on exam  - continue abx - will try clears today.  Continue IVF given degree of preadmission hypovolemia  FEN: clears, IVF @ 100 ml/hr VTE: lovenox ID: meropenem 5/31>>, flagyl 5/31>>  Dispo: continue IV abx and monitor WBC and diet. she will need outpatient follow up with GI for colonoscopy.     LOS: 1 day    [facility] 11/05/2020, 6:22 AM Please see Amion for pager number during day hours 7:00am-4:30pm

## 2020-11-05 NOTE — Progress Notes (Signed)
Verbal order MD Stechschulte for 2 units of PRBCs.

## 2020-11-06 LAB — TYPE AND SCREEN
ABO/RH(D): A POS
Antibody Screen: NEGATIVE
Unit division: 0
Unit division: 0

## 2020-11-06 LAB — CBC
HCT: 24.4 % — ABNORMAL LOW (ref 36.0–46.0)
Hemoglobin: 7.7 g/dL — ABNORMAL LOW (ref 12.0–15.0)
MCH: 22.8 pg — ABNORMAL LOW (ref 26.0–34.0)
MCHC: 31.6 g/dL (ref 30.0–36.0)
MCV: 72.4 fL — ABNORMAL LOW (ref 80.0–100.0)
Platelets: 714 10*3/uL — ABNORMAL HIGH (ref 150–400)
RBC: 3.37 MIL/uL — ABNORMAL LOW (ref 3.87–5.11)
RDW: 20.2 % — ABNORMAL HIGH (ref 11.5–15.5)
WBC: 19.8 10*3/uL — ABNORMAL HIGH (ref 4.0–10.5)
nRBC: 0.2 % (ref 0.0–0.2)

## 2020-11-06 LAB — BASIC METABOLIC PANEL
Anion gap: 11 (ref 5–15)
BUN: 8 mg/dL (ref 6–20)
CO2: 25 mmol/L (ref 22–32)
Calcium: 8.4 mg/dL — ABNORMAL LOW (ref 8.9–10.3)
Chloride: 99 mmol/L (ref 98–111)
Creatinine, Ser: 0.74 mg/dL (ref 0.44–1.00)
GFR, Estimated: >60 mL/min (ref >60)
Glucose, Bld: 118 mg/dL — ABNORMAL HIGH (ref 70–99)
Potassium: 3.4 mmol/L — ABNORMAL LOW (ref 3.5–5.1)
Sodium: 135 mmol/L (ref 135–145)

## 2020-11-06 LAB — PATHOLOGIST SMEAR REVIEW

## 2020-11-06 LAB — BPAM RBC
Blood Product Expiration Date: 202206242359
Blood Product Expiration Date: 202206252359
ISSUE DATE / TIME: 202206010635
ISSUE DATE / TIME: 202206011032
Unit Type and Rh: 6200
Unit Type and Rh: 6200

## 2020-11-06 MED ORDER — POTASSIUM CHLORIDE 10 MEQ/100ML IV SOLN: 10.0000 meq | INTRAVENOUS | Status: DC

## 2020-11-06 MED ORDER — OXYCODONE HCL 5 MG PO TABS
5.0000 mg | ORAL_TABLET | ORAL | Status: DC | PRN
  Administered 2020-11-06: 5 mg via ORAL
  Administered 2020-11-07: 10 mg via ORAL
  Filled 2020-11-06: qty 2
  Filled 2020-11-06: qty 1
  Filled 2020-11-06: qty 2

## 2020-11-06 MED ORDER — POTASSIUM CHLORIDE CRYS ER 20 MEQ PO TBCR
20.0000 meq | EXTENDED_RELEASE_TABLET | Freq: Three times a day (TID) | ORAL | Status: DC
  Administered 2020-11-06 – 2020-11-07 (×5): 20 meq via ORAL
  Filled 2020-11-06 (×6): qty 1

## 2020-11-06 NOTE — Progress Notes (Addendum)
Progress Note     Subjective: CC: She is tired this morning because she did not sleep well last night. Continues to have watery BMs - x5 yesterday per her report. She is not having pain with them like before surgery. Tolerating clears without nausea or emesis. Has not been ambulatory yet today.  Objective: Vital signs in last 24 hours: Temp:  [98.4 F (36.9 C)-100.6 F (38.1 C)] 98.4 F (36.9 C) (06/02 0542) Pulse Rate:  [102-116] 102 (06/02 0542) Resp:  [15-18] 15 (06/02 0542) BP: (104-127)/(64-74) 119/66 (06/02 0542) SpO2:  [98 %-100 %] 99 % (06/02 0542) Last BM Date: 11/05/20  Intake/Output from previous day: 06/01 0701 - 06/02 0700 In: 3196.9 [P.O.:760; I.V.:1523.2; Blood:674; IV Piggyback:239.7] Out: 0  Intake/Output this shift: No intake/output data recorded.  PE: General: pleasant, WD, female who is laying in bed in NAD HEENT: head is normocephalic, atraumatic. Mouth is pink and moist Heart: no cyanosis.  Palpable radial pulses bilaterally Lungs: Respiratory effort nonlabored Abd: soft, ND, +BS, no tenderness to palpation MS: No calf TTP. Right forearm IV site with ecchymosis and mild induration. No spreading erythema or heat Skin: warm and dry with no masses, lesions, or rashes Psych: A&Ox3 with an appropriate affect.    Lab Results:  Recent Labs    11/05/20 0429 11/06/20 0442  WBC 19.5* 19.8*  HGB 5.9* 7.7*  HCT 19.9* 24.4*  PLT 793* 714*   BMET Recent Labs    11/05/20 0429 11/06/20 0442  NA 134* 135  K 3.5 3.4*  CL 98 99  CO2 28 25  GLUCOSE 115* 118*  BUN 10 8  CREATININE 0.85 0.74  CALCIUM 8.4* 8.4*   PT/INR No results for input(s): LABPROT, INR in the last 72 hours. CMP     Component Value Date/Time   NA 135 11/06/2020 0442   NA 139 12/21/2019 1024   K 3.4 (L) 11/06/2020 0442   CL 99 11/06/2020 0442   CO2 25 11/06/2020 0442   GLUCOSE 118 (H) 11/06/2020 0442   BUN 8 11/06/2020 0442   BUN 13 12/21/2019 1024   CREATININE 0.74  11/06/2020 0442   CALCIUM 8.4 (L) 11/06/2020 0442   PROT 7.9 11/04/2020 1119   PROT 7.4 07/11/2019 1638   ALBUMIN 2.5 (L) 11/04/2020 1119   ALBUMIN 4.6 07/11/2019 1638   AST 15 11/04/2020 1119   ALT 12 11/04/2020 1119   ALKPHOS 66 11/04/2020 1119   BILITOT 0.4 11/04/2020 1119   BILITOT 0.2 07/11/2019 1638   GFRNONAA >60 11/06/2020 0442   GFRAA 96 12/21/2019 1024   Lipase     Component Value Date/Time   LIPASE 21 11/04/2020 1119       Studies/Results: CT Abdomen Pelvis Wo Contrast  Result Date: 11/04/2020 CLINICAL DATA:  Abdominal pain diverticulitis suspected. EXAM: CT ABDOMEN AND PELVIS WITHOUT CONTRAST TECHNIQUE: Multidetector CT imaging of the abdomen and pelvis was performed following the standard protocol without IV contrast. COMPARISON:  None. FINDINGS: Lower chest: No acute abnormality. Hepatobiliary: Unremarkable noncontrast appearance of the hepatic parenchyma. Cholelithiasis measuring up to approximately 1 cm without findings of acute cholecystitis. No biliary ductal dilation. Pancreas: Within normal limits. Spleen: Within normal limits. Adrenals/Urinary Tract: Hypodense 3.6 cm left adrenal lesion, imaging characteristics consistent with a benign adrenal adenoma. No hydronephrosis. Hypodense 2.9 cm left lower pole renal cyst. Urinary bladder is grossly unremarkable for degree of distension. Stomach/Bowel: Left-sided colonic diverticulosis. There is short segment wall thickening of the descending colon with adjacent fluid/inflammatory stranding and  multiple punctate foci of gas. Multiple abdominal fluid collections including a 5.7 x 3.7 cm intraperitoneal gas and fluid collection anterior to the sigmoid colon on image 56/2, a 3.7 cm retroperitoneal gas and fluid collection on image 51/2 and a 3.6 x 3.0 cm gas and fluid collection within the left psoas muscle. Vascular/Lymphatic: No significant vascular findings are present. No enlarged abdominal or pelvic lymph nodes. Reproductive:  Leiomyomatous uterus.  No suspicious adnexal masses. Other: Abdominal gas and fluid collections as described above. Musculoskeletal: No acute or significant osseous findings. 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 Perforating descending colonic diverticulitis with multiple abdominopelvic gas and fluid collections, measuring up to 5.7 cm and further described above. 2. Cholelithiasis without findings of acute cholecystitis. 3. 3.6 cm benign left adrenal adenoma. 4. Leiomyomatous uterus. 5. 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 These results were called by telephone at the time of interpretation on 11/04/2020 at 1:17 pm to provider [facility] , who verbally acknowledged these results. Electronically Signed   By: Dahlia Bailiff MD   On: 11/04/2020 13:19    Anti-infectives: Anti-infectives (From admission, onward)   Start     Dose/Rate Route Frequency Ordered Stop   11/04/20 2200  meropenem (MERREM) 2 g in sodium chloride 0.9 % 100 mL IVPB        2 g 200 mL/hr over 30 Minutes Intravenous Every 8 hours 11/04/20 1353     11/04/20 1730  metroNIDAZOLE (FLAGYL) tablet 2,000 mg        2,000 mg Oral  Once 11/04/20 1717 11/05/20 1818   11/04/20 1400  meropenem (MERREM) 1 g in sodium chloride 0.9 % 100 mL IVPB        1 g 200 mL/hr over 30 Minutes Intravenous Every 1 hr x 2 11/04/20 1342 11/04/20 1521       Assessment/Plan Anemia - 7.7 (5.9) following 2 u PRBCs 6/1. Monitor Hgb Hypokalemia - 3.4, PO replacement. Check Mag in am - was replaced on admission  Perforated diverticulitis - febrile to 100.27F last 24H. WBC stable 19.8 (19.5) and nontender on exam  - continue abx - continue clears - decrease IVF - ambulate  FEN: clears, IVF @ 50 ml/hr VTE: lovenox ID: meropenem 5/31>>, flagyl  5/31  Dispo: continue IV abx and monitor WBC and diet. she will need outpatient follow up with GI for colonoscopy.     LOS: 2 days    [facility] 11/06/2020, 8:02 AM Please see Amion for pager number during day hours 7:00am-4:30pm

## 2020-11-07 ENCOUNTER — Inpatient Hospital Stay (HOSPITAL_COMMUNITY): Payer: BC Managed Care – PPO

## 2020-11-07 ENCOUNTER — Encounter (HOSPITAL_COMMUNITY): Payer: Self-pay

## 2020-11-07 LAB — BASIC METABOLIC PANEL
Anion gap: 8 (ref 5–15)
BUN: 7 mg/dL (ref 6–20)
CO2: 27 mmol/L (ref 22–32)
Calcium: 8.6 mg/dL — ABNORMAL LOW (ref 8.9–10.3)
Chloride: 101 mmol/L (ref 98–111)
Creatinine, Ser: 0.74 mg/dL (ref 0.44–1.00)
GFR, Estimated: >60 mL/min (ref >60)
Glucose, Bld: 105 mg/dL — ABNORMAL HIGH (ref 70–99)
Potassium: 4.2 mmol/L (ref 3.5–5.1)
Sodium: 136 mmol/L (ref 135–145)

## 2020-11-07 LAB — CBC
HCT: 27.6 % — ABNORMAL LOW (ref 36.0–46.0)
Hemoglobin: 8.3 g/dL — ABNORMAL LOW (ref 12.0–15.0)
MCH: 22.4 pg — ABNORMAL LOW (ref 26.0–34.0)
MCHC: 30.1 g/dL (ref 30.0–36.0)
MCV: 74.4 fL — ABNORMAL LOW (ref 80.0–100.0)
Platelets: 772 10*3/uL — ABNORMAL HIGH (ref 150–400)
RBC: 3.71 MIL/uL — ABNORMAL LOW (ref 3.87–5.11)
RDW: 21.4 % — ABNORMAL HIGH (ref 11.5–15.5)
WBC: 20.9 10*3/uL — ABNORMAL HIGH (ref 4.0–10.5)
nRBC: 0.1 % (ref 0.0–0.2)

## 2020-11-07 LAB — MAGNESIUM: Magnesium: 1.9 mg/dL (ref 1.7–2.4)

## 2020-11-07 LAB — PROTIME-INR
INR: 1.3 — ABNORMAL HIGH (ref 0.8–1.2)
Prothrombin Time: 15.7 seconds — ABNORMAL HIGH (ref 11.4–15.2)

## 2020-11-07 MED ORDER — MIDAZOLAM HCL 2 MG/2ML IJ SOLN
INTRAMUSCULAR | Status: AC | PRN
  Administered 2020-11-07 (×4): 1 mg via INTRAVENOUS

## 2020-11-07 MED ORDER — SODIUM CHLORIDE 0.9% FLUSH
5.0000 mL | Freq: Three times a day (TID) | INTRAVENOUS | Status: DC
  Administered 2020-11-07 – 2020-11-10 (×8): 5 mL

## 2020-11-07 MED ORDER — FENTANYL CITRATE (PF) 100 MCG/2ML IJ SOLN
INTRAMUSCULAR | Status: AC
  Filled 2020-11-07: qty 4

## 2020-11-07 MED ORDER — MIDAZOLAM HCL 2 MG/2ML IJ SOLN
INTRAMUSCULAR | Status: AC
  Filled 2020-11-07: qty 4

## 2020-11-07 MED ORDER — FENTANYL CITRATE (PF) 100 MCG/2ML IJ SOLN
INTRAMUSCULAR | Status: AC | PRN
  Administered 2020-11-07 (×4): 50 ug via INTRAVENOUS

## 2020-11-07 MED ORDER — LIDOCAINE HCL (PF) 1 % IJ SOLN
INTRAMUSCULAR | Status: AC | PRN
  Administered 2020-11-07: 30 mL

## 2020-11-07 MED ORDER — IOHEXOL 9 MG/ML PO SOLN: 500.0000 mL | ORAL | Status: AC

## 2020-11-07 NOTE — Progress Notes (Signed)
Pharmacy Antibiotic Note  Mikayla Rios is a 35 y.o. female admitted on 11/04/2020 with intra-abdominal infection, perforated diverticulitis. Pharmacy has been consulted for meropenem dosing.   Day 4 meropenem  Afebrile  WBC 20.9, fairly unchanged  Cultures with no growth to date  Plan: Continue meropenem 2g IV q8 per current renal function  Height: 5\' 3"  (160 cm) Weight: 120.2 kg (265 lb) IBW/kg (Calculated) : 52.4  Temp (24hrs), Avg:99 F (37.2 C), Min:98.4 F (36.9 C), Max:99.7 F (37.6 C)  Recent Labs  Lab 11/04/20 1119 11/04/20 1337 11/04/20 1648 11/05/20 0429 11/06/20 0442 11/07/20 0402  WBC 23.3*  --   --  19.5* 19.8* 20.9*  CREATININE 1.43*  --   --  0.85 0.74 0.74  LATICACIDVEN  --  1.7 1.7  --   --   --     Estimated Creatinine Clearance: 122 mL/min (by C-G formula based on SCr of 0.74 mg/dL).    Allergies  Allergen Reactions  . Shellfish Allergy Hives  . Penicillins Hives and Rash    Has patient had a PCN reaction causing immediate rash, facial/tongue/throat swelling, SOB or lightheadedness with hypotension: No Has patient had a PCN reaction causing severe rash involving mucus membranes or skin necrosis: hives Has patient had a PCN reaction that required hospitalization No Has patient had a PCN reaction occurring within the last 10 years: yes If all of the above answers are "NO", then may proceed with Cephalosporin use.     Thank you for allowing pharmacy to be a part of this patient's care.  Kara Mead 11/07/2020 10:41 AM

## 2020-11-07 NOTE — Consult Note (Signed)
Chief Complaint: Patient was seen in consultation today for possible CT guided aspiration/drainage of abdominal /pelvic fluid collections Chief Complaint  Patient presents with  . Abdominal Pain    Referring Physician(s): [place]  Supervising Physician: Sandi Mariscal  Patient Status: [facility] - In-pt  History of Present Illness: Mikayla Rios is a 35 y.o. female with past medical history of hypertension who was admitted to [facility] on 5/31 with worsening diarrhea along with crampy abdominal pain and occasional nausea.  She was diagnosed with COVID-19 on May 2. Imaging at the time revealed:  1. Perforating descending colonic diverticulitis with multiple abdominopelvic gas and fluid collections, measuring up to 5.7 cm and further described above. 2. Cholelithiasis without findings of acute cholecystitis. 3. 3.6 cm benign left adrenal adenoma. 4. Leiomyomatous uterus. 5. 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  She  is currently afebrile, on IV antibiotic therapy, latest WBC 20.9 up from 19.8, hemoglobin 8.3, platelets 772k, creatinine normal, C. difficile negative, urinalysis with trichomonas.  She is scheduled today for follow-up CT abdomen pelvis and possible image guided aspiration/drainage of abdominal/pelvic fluid collections if amenable.   Past Medical History:  Diagnosis Date  . Allergy    seasonal  . Blood infection ([place]) 1985  . Blood transfusion without reported diagnosis   . Hypertension     Past Surgical History:  Procedure Laterality Date  . NO PAST SURGERIES      Allergies: Shellfish allergy and Penicillins  Medications: Prior to Admission medications   Medication Sig Start Date End Date Taking? Authorizing Provider  acetaminophen (TYLENOL) 650 MG CR tablet Take 1,300 mg by mouth every 8 (eight) hours as needed for pain.   Yes [provider]  amLODipine (NORVASC) 10 MG tablet Take 1 tablet (10 mg total) by mouth daily. 01/02/20  Yes [name], MD  benzonatate (TESSALON PERLES) 100 MG capsule Take 1 capsule (100 mg total) by mouth every 6 (six) hours as needed. Patient taking differently: Take 100 mg by mouth every 6 (six) hours as needed for cough. 10/06/20 10/06/21 Yes Minette Brine, FNP  dicyclomine (BENTYL) 10 MG capsule Take 1 capsule (10 mg total) by mouth 4 (four) times daily for 14 days. 10/23/20 11/06/20 Yes Minette Brine, FNP  lisinopril-hydrochlorothiazide (ZESTORETIC) 20-12.5 MG tablet Take 2 tablets by mouth daily. 11/23/19  Yes [name], MD  metoprolol succinate (TOPROL-XL) 25 MG 24 hr tablet Take 1 tablet (25 mg total) by mouth 2 (two) times daily. 05/15/20  Yes Ghumman, Ramandeep, NP  Probiotic Product (PROBIOTIC PO) Take 1 capsule by mouth daily.   Yes [provider]  predniSONE (DELTASONE) 20 MG tablet Take 2 tablets by mouth daily x 3 days Patient not taking: Reported on 11/04/2020 10/06/20   Minette Brine, FNP     Family History  Problem Relation Age of Onset  . Cancer Mother        breast, and in remission  . Diabetes Mother   . Hypertension Mother   . Autism Son   . Heart disease Paternal Aunt   . Cancer Paternal Uncle        colon  . Diabetes Maternal Grandmother   . Hypertension Maternal Grandmother   . Diabetes Maternal Grandfather   . Kidney disease Maternal Grandfather   . Diabetes Paternal Grandmother   . Cancer Paternal Grandfather        colon  .  Congestive Heart Failure Father   . Hypertension Father     Social History   Socioeconomic History  . Marital status: Married    Spouse name: Not on file  . Number of children: 3  . Years of education: Not on file  . Highest education level: Not on file  Occupational History  . Not on file  Tobacco Use  . Smoking status: Never Smoker  . Smokeless tobacco: Never Used  Vaping Use  . Vaping Use:  Never used  Substance and Sexual Activity  . Alcohol use: Not Currently  . Drug use: No  . Sexual activity: Yes    Birth control/protection: None  Other Topics Concern  . Not on file  Social History Narrative  . Not on file   Social Determinants of Health   Financial Resource Strain: Not on file  Food Insecurity: Not on file  Transportation Needs: Not on file  Physical Activity: Not on file  Stress: Not on file  Social Connections: Not on file      Review of Systems currently denies fever, headache, chest pain, dyspnea, cough, worsening abdominal pain, back pain, nausea, vomiting or bleeding.  Vital Signs: BP 124/77 (BP Location: Right Arm)   Pulse (!) 105   Temp 98.4 F (36.9 C) (Oral)   Resp 18   Ht 5\' 3"  (1.6 m)   Wt 265 lb (120.2 kg)   LMP 10/21/2020   SpO2 100%   BMI 46.94 kg/m   Physical Exam awake, alert.  Chest clear to auscultation bilaterally.  Heart with slightly tachycardic but regular rhythm.  Abdomen soft, positive bowel sounds, currently nontender.  No significant lower extremity edema.  Imaging: CT Abdomen Pelvis Wo Contrast  Result Date: 11/04/2020 CLINICAL DATA:  Abdominal pain diverticulitis suspected. EXAM: CT ABDOMEN AND PELVIS WITHOUT CONTRAST TECHNIQUE: Multidetector CT imaging of the abdomen and pelvis was performed following the standard protocol without IV contrast. COMPARISON:  None. FINDINGS: Lower chest: No acute abnormality. Hepatobiliary: Unremarkable noncontrast appearance of the hepatic parenchyma. Cholelithiasis measuring up to approximately 1 cm without findings of acute cholecystitis. No biliary ductal dilation. Pancreas: Within normal limits. Spleen: Within normal limits. Adrenals/Urinary Tract: Hypodense 3.6 cm left adrenal lesion, imaging characteristics consistent with a benign adrenal adenoma. No hydronephrosis. Hypodense 2.9 cm left lower pole renal cyst. Urinary bladder is grossly unremarkable for degree of distension.  Stomach/Bowel: Left-sided colonic diverticulosis. There is short segment wall thickening of the descending colon with adjacent fluid/inflammatory stranding and multiple punctate foci of gas. Multiple abdominal fluid collections including a 5.7 x 3.7 cm intraperitoneal gas and fluid collection anterior to the sigmoid colon on image 56/2, a 3.7 cm retroperitoneal gas and fluid collection on image 51/2 and a 3.6 x 3.0 cm gas and fluid collection within the left psoas muscle. Vascular/Lymphatic: No significant vascular findings are present. No enlarged abdominal or pelvic lymph nodes. Reproductive: Leiomyomatous uterus.  No suspicious adnexal masses. Other: Abdominal gas and fluid collections as described above. Musculoskeletal: No acute or significant osseous findings. 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 Perforating descending colonic diverticulitis with multiple abdominopelvic gas and fluid collections, measuring up to 5.7 cm and further described above. 2. Cholelithiasis without findings of acute cholecystitis. 3. 3.6 cm benign left adrenal adenoma. 4. Leiomyomatous uterus. 5. 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  These results were called by telephone at the time of interpretation on 11/04/2020 at 1:17 pm to provider [facility] , who verbally acknowledged these results. Electronically Signed   By: Dahlia Bailiff MD   On: 11/04/2020 13:19    Labs:  CBC: Recent Labs    11/04/20 1119 11/05/20 0429 11/06/20 0442 11/07/20 0402  WBC 23.3* 19.5* 19.8* 20.9*  HGB 6.9* 5.9* 7.7* 8.3*  HCT 22.4* 19.9* 24.4* 27.6*  PLT 1,119* 793* 714* 772*    COAGS: No results for input(s): INR, APTT in the last 8760 hours.  BMP: Recent Labs    12/21/19 1024 11/04/20 1119 11/05/20 0429 11/06/20 0442  11/07/20 0402  NA 139 126* 134* 135 136  K 4.1 2.3* 3.5 3.4* 4.2  CL 99 85* 98 99 101  CO2 23 25 28 25 27   GLUCOSE 99 106* 115* 118* 105*  BUN 13 15 10 8 7   CALCIUM 9.6 8.5* 8.4* 8.4* 8.6*  CREATININE 0.90 1.43* 0.85 0.74 0.74  GFRNONAA 83 49* >60 >60 >60  GFRAA 96  --   --   --   --     LIVER FUNCTION TESTS: Recent Labs    11/04/20 1119  BILITOT 0.4  AST 15  ALT 12  ALKPHOS 66  PROT 7.9  ALBUMIN 2.5*    TUMOR MARKERS: No results for input(s): AFPTM, CEA, CA199, CHROMGRNA in the last 8760 hours.  Assessment and Plan: 35 y.o. female with past medical history of hypertension who was admitted to [facility] on 5/31 with worsening diarrhea along with crampy abdominal pain and occasional nausea.  She was diagnosed with COVID-19 on May 2. Imaging at the time revealed:  1. Perforating descending colonic diverticulitis with multiple abdominopelvic gas and fluid collections, measuring up to 5.7 cm and further described above. 2. Cholelithiasis without findings of acute cholecystitis. 3. 3.6 cm benign left adrenal adenoma. 4. Leiomyomatous uterus. 5. 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  She  is currently afebrile, on IV antibiotic therapy, latest WBC 20.9 up from 19.8, hemoglobin 8.3, platelets 772k, creatinine normal, C. difficile negative, urinalysis with trichomonas.  Latest COVID-19 negative.  She is scheduled today for follow-up CT abdomen pelvis and possible image guided aspiration/drainage of abdominal/pelvic fluid collections if amenable.  Case has been reviewed by Dr. Pascal Lux.  Will await follow-up CT results today before considering drain placement.  Details/risks of procedure, including but not limited to, internal bleeding, infection, injury to adjacent structures, need for prolonged drainage or possibly no drain placement discussed with patient with her  understanding and consent.   Thank you for this interesting consult.  I greatly enjoyed meeting Mikayla Rios and look forward to participating in their care.  A copy of this report was sent to the requesting provider on this date.  Electronically Signed: D. Rowe Robert, PA-C 11/07/2020, 11:51 AM   I spent a total of 25 minutes  in face to face in clinical consultation, greater than 50% of which was counseling/coordinating care for possible CT-guided aspiration/drainage of abdominal/pelvic fluid collections

## 2020-11-07 NOTE — Progress Notes (Signed)
Return from IR with temp of 100.6, and HR 118. IV occluded.  Dr. Marlou Starks made aware of yellow mews, no new orders noted.  Tylenol given as ordered for increased temp.

## 2020-11-07 NOTE — Progress Notes (Signed)
Progress Note     Subjective: CC: She did rested a lot yesterday and did not sleep well last night as a result. She is still having loose stools - x5 yesterday. No pain with them. Tolerating clears without pain, nausea, emesis. Ambulating  She has left thigh pain radiating to her left lower back which has been present since prior to admission and she relates to her baseline musculoskeletal problems  Objective: Vital signs in last 24 hours: Temp:  [98.4 F (36.9 C)-99.7 F (37.6 C)] 98.4 F (36.9 C) (06/03 0608) Pulse Rate:  [105-108] 105 (06/03 0608) Resp:  [14-18] 18 (06/03 0608) BP: (110-124)/(70-77) 124/77 (06/03 0608) SpO2:  [100 %] 100 % (06/03 0608) Last BM Date: 11/05/20  Intake/Output from previous day: 06/02 0701 - 06/03 0700 In: 1459.4 [P.O.:500; I.V.:602.8; IV Piggyback:356.6] Out: 1280 [Urine:1280] Intake/Output this shift: No intake/output data recorded.  PE: General: pleasant, WD, female who is laying in bed in NAD HEENT: head is normocephalic, atraumatic. Mouth is pink and moist Heart: no cyanosis.  Palpable radial pulses bilaterally Lungs: Respiratory effort nonlabored Abd: soft, ND, +BS, no tenderness to palpation MS: No calf TTP bilaterally. Right forearm IV site with ecchymosis and mild improving induration - no spreading erythema or heat Skin: warm and dry with no masses, lesions, or rashes Psych: A&Ox3 with an appropriate affect.    Lab Results:  Recent Labs    11/06/20 0442 11/07/20 0402  WBC 19.8* 20.9*  HGB 7.7* 8.3*  HCT 24.4* 27.6*  PLT 714* 772*   BMET Recent Labs    11/06/20 0442 11/07/20 0402  NA 135 136  K 3.4* 4.2  CL 99 101  CO2 25 27  GLUCOSE 118* 105*  BUN 8 7  CREATININE 0.74 0.74  CALCIUM 8.4* 8.6*   PT/INR No results for input(s): LABPROT, INR in the last 72 hours. CMP     Component Value Date/Time   NA 136 11/07/2020 0402   NA 139 12/21/2019 1024   K 4.2 11/07/2020 0402   CL 101 11/07/2020 0402   CO2 27  11/07/2020 0402   GLUCOSE 105 (H) 11/07/2020 0402   BUN 7 11/07/2020 0402   BUN 13 12/21/2019 1024   CREATININE 0.74 11/07/2020 0402   CALCIUM 8.6 (L) 11/07/2020 0402   PROT 7.9 11/04/2020 1119   PROT 7.4 07/11/2019 1638   ALBUMIN 2.5 (L) 11/04/2020 1119   ALBUMIN 4.6 07/11/2019 1638   AST 15 11/04/2020 1119   ALT 12 11/04/2020 1119   ALKPHOS 66 11/04/2020 1119   BILITOT 0.4 11/04/2020 1119   BILITOT 0.2 07/11/2019 1638   GFRNONAA >60 11/07/2020 0402   GFRAA 96 12/21/2019 1024   Lipase     Component Value Date/Time   LIPASE 21 11/04/2020 1119       Studies/Results: No results found.  Anti-infectives: Anti-infectives (From admission, onward)   Start     Dose/Rate Route Frequency Ordered Stop   11/04/20 2200  meropenem (MERREM) 2 g in sodium chloride 0.9 % 100 mL IVPB        2 g 200 mL/hr over 30 Minutes Intravenous Every 8 hours 11/04/20 1353     11/04/20 1730  metroNIDAZOLE (FLAGYL) tablet 2,000 mg        2,000 mg Oral  Once 11/04/20 1717 11/05/20 1818   11/04/20 1400  meropenem (MERREM) 1 g in sodium chloride 0.9 % 100 mL IVPB        1 g 200 mL/hr over 30 Minutes Intravenous  Every 1 hr x 2 11/04/20 1342 11/04/20 1521       Assessment/Plan Anemia - 8.3 (7.7) following 2 u PRBCs 6/1. Monitor Hgb Hypokalemia - resolved following PO replacement - monitor  Perforated diverticulitis - afebrile. Nontender on exam  - continue IV merrem  - continue clears - ambulate  - WBC remains elevated 20.9 (19.8) and she has fluid collections on CT 5/31 - will discuss with IR if these are amenable to drainage. Otherwise consider repeat CT and/or abx adjustments if no improvement in WBC  FEN: clears, IVF @ 50 ml/hr VTE: lovenox ID: flagyl 5/31, meropenem 5/31>>  Dispo: continue IV abx and monitor WBC and diet. she will need outpatient follow up with GI for colonoscopy.     LOS: 3 days    [facility] 11/07/2020, 8:00 AM Please see Amion  for pager number during day hours 7:00am-4:30pm

## 2020-11-07 NOTE — Procedures (Signed)
Pre procedural Dx: Diverticular abscesses Post procedural Dx: Same  Technically successful CT guided placement of a 12 Fr multi-side hole (biliary type) drainage catheter placement into adjacent RP abscesses yielding 150 cc of purulent fluid.   Technically successful CT guided placement of a 10 Fr drainage catheter placement into interloop abscess within the ventral aspect of the left mid abdomen yielding 75 cc of purulent fluid.  A representative aspirated sample was capped and sent to the laboratory for analysis.    Both drain connected to gravity bags.   EBL: Trace Complications: None immediate  Ronny Bacon, MD Pager #: [phone]

## 2020-11-08 LAB — CBC
HCT: 26.6 % — ABNORMAL LOW (ref 36.0–46.0)
Hemoglobin: 8 g/dL — ABNORMAL LOW (ref 12.0–15.0)
MCH: 22.5 pg — ABNORMAL LOW (ref 26.0–34.0)
MCHC: 30.1 g/dL (ref 30.0–36.0)
MCV: 74.7 fL — ABNORMAL LOW (ref 80.0–100.0)
Platelets: 710 10*3/uL — ABNORMAL HIGH (ref 150–400)
RBC: 3.56 MIL/uL — ABNORMAL LOW (ref 3.87–5.11)
RDW: 22.1 % — ABNORMAL HIGH (ref 11.5–15.5)
WBC: 19.2 10*3/uL — ABNORMAL HIGH (ref 4.0–10.5)
nRBC: 0.2 % (ref 0.0–0.2)

## 2020-11-08 LAB — BASIC METABOLIC PANEL
Anion gap: 7 (ref 5–15)
BUN: 6 mg/dL (ref 6–20)
CO2: 26 mmol/L (ref 22–32)
Calcium: 8.3 mg/dL — ABNORMAL LOW (ref 8.9–10.3)
Chloride: 102 mmol/L (ref 98–111)
Creatinine, Ser: 0.71 mg/dL (ref 0.44–1.00)
GFR, Estimated: >60 mL/min (ref >60)
Glucose, Bld: 99 mg/dL (ref 70–99)
Potassium: 4.8 mmol/L (ref 3.5–5.1)
Sodium: 135 mmol/L (ref 135–145)

## 2020-11-08 NOTE — Progress Notes (Signed)
Referring Physician(s): Leighton Ruff ([place])  Supervising Physician: Aletta Edouard  Patient Status:  [facility] - In-pt  Chief Complaint:  History of diverticular abscesses s/p LLQ drain placement x2 (one more medial, one more lateral) in IR 11/07/2020.  Subjective:  Patient awake and alert laying in bed watching TV. Complains of tenderness of drain insertion sites, as expected. LLQ drain sites c/d/i.   Allergies: Shellfish allergy and Penicillins  Medications: Prior to Admission medications   Medication Sig Start Date End Date Taking? Authorizing Provider  acetaminophen (TYLENOL) 650 MG CR tablet Take 1,300 mg by mouth every 8 (eight) hours as needed for pain.   Yes [provider]  amLODipine (NORVASC) 10 MG tablet Take 1 tablet (10 mg total) by mouth daily. 01/02/20  Yes [name], MD  benzonatate (TESSALON PERLES) 100 MG capsule Take 1 capsule (100 mg total) by mouth every 6 (six) hours as needed. Patient taking differently: Take 100 mg by mouth every 6 (six) hours as needed for cough. 10/06/20 10/06/21 Yes Minette Brine, FNP  dicyclomine (BENTYL) 10 MG capsule Take 1 capsule (10 mg total) by mouth 4 (four) times daily for 14 days. 10/23/20 11/06/20 Yes Minette Brine, FNP  lisinopril-hydrochlorothiazide (ZESTORETIC) 20-12.5 MG tablet Take 2 tablets by mouth daily. 11/23/19  Yes [name], MD  metoprolol succinate (TOPROL-XL) 25 MG 24 hr tablet Take 1 tablet (25 mg total) by mouth 2 (two) times daily. 05/15/20  Yes Ghumman, Ramandeep, NP  Probiotic Product (PROBIOTIC PO) Take 1 capsule by mouth daily.   Yes [provider]  predniSONE (DELTASONE) 20 MG tablet Take 2 tablets by mouth daily x 3 days Patient not taking: Reported on 11/04/2020 10/06/20   Minette Brine, FNP     Vital Signs: BP 108/69 (BP Location: Right Arm)   Pulse (!) 102   Temp 98.6 F (37 C) (Oral)   Resp 18   Ht 5\' 3"  (1.6 m)   Wt 265 lb (120.2 kg)   LMP 10/21/2020  Comment: negative urine pregnancy test 11-04-2020  SpO2 97%   BMI 46.94 kg/m   Physical Exam Vitals and nursing note reviewed.  Constitutional:      General: She is not in acute distress.    Appearance: She is obese.  Pulmonary:     Effort: Pulmonary effort is normal. No respiratory distress.  Abdominal:     Comments: LLQ drain sites with mild tenderness, no erythema, drainage, or active bleeding noted. LLQ drain (medial) with minimal output of serosanguinous fluid in gravity bag.  LLQ drain (lateral) with minimal output of serosanguinous fluid in gravity bag. Both LLQ drains flush/aspiration without difficulty, with return of OP into drainage tubes.  Skin:    General: Skin is warm and dry.  Neurological:     Mental Status: She is alert and oriented to person, place, and time.     Imaging: CT ABDOMEN PELVIS WO CONTRAST  Result Date: 11/07/2020 CLINICAL DATA:  Abdominal abscess or infection. EXAM: CT ABDOMEN AND PELVIS WITHOUT CONTRAST TECHNIQUE: Multidetector CT imaging of the abdomen and pelvis was performed following the standard protocol without IV contrast. COMPARISON:  Nov 04, 2020. FINDINGS: Lower chest: No acute abnormality. Hepatobiliary: Cholelithiasis is noted without biliary dilatation. Liver is unremarkable. Pancreas: Unremarkable. No pancreatic ductal dilatation or surrounding inflammatory changes. Spleen: Normal in size without focal abnormality. Adrenals/Urinary Tract: Stable left adrenal adenoma. Right adrenal gland appears normal. Stable left renal cyst. No hydronephrosis or renal obstruction is noted. No renal or  ureteral calculi are noted. Urinary bladder is unremarkable. Stomach/Bowel: The stomach appears normal. The appendix appears normal. There is no evidence of bowel obstruction. Continued wall thickening of descending colon is noted suggesting infectious or inflammatory colitis or diverticulitis. 7.0 x 5.0 cm adjacent fluid collection is noted consistent with abscess  which is enlarged compared to prior exam. There is also noted severe wall thickening involving adjacent small bowel loop, most consistent with secondary inflammation. 5.2 x 3.5 cm fluid collection is noted within the left psoas muscle which is significantly enlarged compared to prior exam. 4.4 x 3.8 cm fluid collection is noted in left retroperitoneal region which is also enlarged compared to prior exam. Vascular/Lymphatic: No significant vascular findings are present. No enlarged abdominal or pelvic lymph nodes. Reproductive: Fibroid uterus is again noted. No adnexal abnormality is noted. Other: No abdominal wall hernia or abnormality. No abdominopelvic ascites. Musculoskeletal: No acute or significant osseous findings. IMPRESSION: Continued wall thickening is seen involving descending colon suggesting infectious or inflammatory colitis or perforated diverticulitis. There is an adjacent fluid collection measuring 7.0 x 5.0 cm consistent with abscess which is significantly enlarged compared to prior exam. Adjacent to the abscess, there is a severely thickened small bowel loop most consistent with secondary inflammation. 5.2 x 3.5 cm fluid collection is noted within the left psoas muscle consistent with abscess which is significantly enlarged compared to prior exam. 4.4 x 3.8 cm fluid collection consistent with abscess is noted in the left retroperitoneal region which is also enlarged compared to prior exam. Fibroid uterus. Cholelithiasis. Electronically Signed   By: Marijo Conception M.D.   On: 11/07/2020 15:32   CT IMAGE GUIDED DRAINAGE BY PERCUTANEOUS CATHETER  Result Date: 11/07/2020 INDICATION: Diverticular abscesses. Please perform percutaneous drainage catheter placement for infection source control purposes. EXAM: 1. CT-GUIDED RETROPERITONEAL ABSCESS DRAINAGE CATHETER PLACEMENT 2. CT-GUIDED ABDOMINAL DRAINAGE CATHETER PLACEMENT COMPARISON:  CT abdomen pelvis-earlier same day; 11/05/2018 MEDICATIONS: The  patient is currently admitted to the hospital and receiving intravenous antibiotics. The antibiotics were administered within an appropriate time frame prior to the initiation of the procedure. ANESTHESIA/SEDATION: Moderate (conscious) sedation was employed during this procedure. A total of Versed 4 mg and Fentanyl 200 mcg was administered intravenously. Moderate Sedation Time: 41 minutes. The patient's level of consciousness and vital signs were monitored continuously by radiology nursing throughout the procedure under my direct supervision. CONTRAST:  None COMPLICATIONS: None immediate. PROCEDURE: Informed written consent was obtained from the patient after a discussion of the risks, benefits and alternatives to treatment. The patient was placed supine on the CT gantry and a pre procedural CT was performed re-demonstrating the known bilobed abscess/fluid collection within the left retro to peritoneal space with more lateral component measuring approximately 4.4 x 3.8 cm (image 52, series 4) and more medially located collection measuring approximately 5.2 x 4.5 cm (image 56, series 4), as well as a an interloop abscess measuring at least 6.9 x 4.5 cm (image 52, series 4). The procedures were planned. A timeout was performed prior to the initiation of the procedure. The skin overlying the left lateral abdomen was prepped and draped in the usual sterile fashion. The overlying soft tissues were anesthetized with 1% lidocaine with epinephrine. Appropriate trajectory was planned with the use of a 22 gauge spinal needle. Under intermittent CT guidance, both collections were targeted with 18 gauge trocar needles ultimately allowing placement of Amplatz wires within both collections. Note was made to avoid adjacent loop of small bowel when targeting the  interloop abscess. Ultimately, the adjacent retroperitoneal collections were successfully cannulated in a through and through manner allowing placement of a 12 French multi  sidehole (biliary type) percutaneous drainage catheter with end coiled and locked within the more medial aspect of the collection and sideholes traversing through the smaller lateral collection. Approximately 150 cc of purulent fluid was aspirated from the retroperitoneal drainage catheter Ultimately, the interloop abscess was cannulated allowing placement of a 10 French drainage catheter yielding 75 cc of purulent fluid. A representative sample of aspirated fluid was capped and sent to the laboratory for analysis Postprocedural imaging was obtained and the procedure was terminated. Both drainage catheters were secured at the skin entrance site within interrupted sutures and StatLock devices. Both drainage catheters were connected to gravity bags. Dressings were applied. The patient tolerated the procedure well without immediate post procedural complication. IMPRESSION: 1. Successful CT guided placement of a 12 French multi sidehole (biliary type) drain catheter into the adjacent retroperitoneal abscesses via left lateral approach with aspiration of 150 mL of purulent fluid. 2. Successful CT-guided placement of a 10 French drainage catheter into interloop abscess via left ventral lateral approach yielding 75 cc of purulent fluid. 3. A representative sample of aspirated fluid was capped and sent to laboratory for analysis. Electronically Signed   By: Sandi Mariscal M.D.   On: 11/07/2020 18:10   CT IMAGE GUIDED DRAINAGE PERCUT CATH  PERITONEAL RETROPERIT  Result Date: 11/07/2020 INDICATION: Diverticular abscesses. Please perform percutaneous drainage catheter placement for infection source control purposes. EXAM: 1. CT-GUIDED RETROPERITONEAL ABSCESS DRAINAGE CATHETER PLACEMENT 2. CT-GUIDED ABDOMINAL DRAINAGE CATHETER PLACEMENT COMPARISON:  CT abdomen pelvis-earlier same day; 11/05/2018 MEDICATIONS: The patient is currently admitted to the hospital and receiving intravenous antibiotics. The antibiotics were  administered within an appropriate time frame prior to the initiation of the procedure. ANESTHESIA/SEDATION: Moderate (conscious) sedation was employed during this procedure. A total of Versed 4 mg and Fentanyl 200 mcg was administered intravenously. Moderate Sedation Time: 41 minutes. The patient's level of consciousness and vital signs were monitored continuously by radiology nursing throughout the procedure under my direct supervision. CONTRAST:  None COMPLICATIONS: None immediate. PROCEDURE: Informed written consent was obtained from the patient after a discussion of the risks, benefits and alternatives to treatment. The patient was placed supine on the CT gantry and a pre procedural CT was performed re-demonstrating the known bilobed abscess/fluid collection within the left retro to peritoneal space with more lateral component measuring approximately 4.4 x 3.8 cm (image 52, series 4) and more medially located collection measuring approximately 5.2 x 4.5 cm (image 56, series 4), as well as a an interloop abscess measuring at least 6.9 x 4.5 cm (image 52, series 4). The procedures were planned. A timeout was performed prior to the initiation of the procedure. The skin overlying the left lateral abdomen was prepped and draped in the usual sterile fashion. The overlying soft tissues were anesthetized with 1% lidocaine with epinephrine. Appropriate trajectory was planned with the use of a 22 gauge spinal needle. Under intermittent CT guidance, both collections were targeted with 18 gauge trocar needles ultimately allowing placement of Amplatz wires within both collections. Note was made to avoid adjacent loop of small bowel when targeting the interloop abscess. Ultimately, the adjacent retroperitoneal collections were successfully cannulated in a through and through manner allowing placement of a 12 French multi sidehole (biliary type) percutaneous drainage catheter with end coiled and locked within the more medial  aspect of the collection and sideholes traversing through  the smaller lateral collection. Approximately 150 cc of purulent fluid was aspirated from the retroperitoneal drainage catheter Ultimately, the interloop abscess was cannulated allowing placement of a 10 French drainage catheter yielding 75 cc of purulent fluid. A representative sample of aspirated fluid was capped and sent to the laboratory for analysis Postprocedural imaging was obtained and the procedure was terminated. Both drainage catheters were secured at the skin entrance site within interrupted sutures and StatLock devices. Both drainage catheters were connected to gravity bags. Dressings were applied. The patient tolerated the procedure well without immediate post procedural complication. IMPRESSION: 1. Successful CT guided placement of a 12 French multi sidehole (biliary type) drain catheter into the adjacent retroperitoneal abscesses via left lateral approach with aspiration of 150 mL of purulent fluid. 2. Successful CT-guided placement of a 10 French drainage catheter into interloop abscess via left ventral lateral approach yielding 75 cc of purulent fluid. 3. A representative sample of aspirated fluid was capped and sent to laboratory for analysis. Electronically Signed   By: Sandi Mariscal M.D.   On: 11/07/2020 18:10    Labs:  CBC: Recent Labs    11/05/20 0429 11/06/20 0442 11/07/20 0402 11/08/20 0421  WBC 19.5* 19.8* 20.9* 19.2*  HGB 5.9* 7.7* 8.3* 8.0*  HCT 19.9* 24.4* 27.6* 26.6*  PLT 793* 714* 772* 710*    COAGS: Recent Labs    11/07/20 1820  INR 1.3*    BMP: Recent Labs    12/21/19 1024 11/04/20 1119 11/05/20 0429 11/06/20 0442 11/07/20 0402 11/08/20 0421  NA 139   < > 134* 135 136 135  K 4.1   < > 3.5 3.4* 4.2 4.8  CL 99   < > 98 99 101 102  CO2 23   < > 28 25 27 26   GLUCOSE 99   < > 115* 118* 105* 99  BUN 13   < > 10 8 7 6   CALCIUM 9.6   < > 8.4* 8.4* 8.6* 8.3*  CREATININE 0.90   < > 0.85 0.74 0.74  0.71  GFRNONAA 83   < > >60 >60 >60 >60  GFRAA 96  --   --   --   --   --    < > = values in this interval not displayed.    LIVER FUNCTION TESTS: Recent Labs    11/04/20 1119  BILITOT 0.4  AST 15  ALT 12  ALKPHOS 66  PROT 7.9  ALBUMIN 2.5*    Assessment and Plan:  History of diverticular abscesses s/p LLQ drain placement x2 (one more medial, one more lateral) in IR 11/07/2020. LLQ drain (medial) stable with minimal output of serosanguinous fluid in gravity bag (additional 150 cc output from drain in past 24 hours per chart).  LLQ drain (lateral) stable with minimal output of serosanguinous fluid in gravity bag (additional 275 cc output from drain in past 24 hours per chart). Continue current drain management- continue with Qshift flushes/monitor of output. Plan for repeat CT/possible drain injection when output <10 cc/day (assess for possible removal). Further plans per [facility]- appreciate and agree with management. IR to follow.   Electronically Signed: Earley Abide, PA-C 11/08/2020, 1:54 PM   I spent a total of 15 Minutes at the the patient's bedside AND on the patient's hospital floor or unit, greater than 50% of which was counseling/coordinating care for abdominal fluid collection s/p drain placement x2.

## 2020-11-08 NOTE — Progress Notes (Signed)
Progress Note     Subjective: Feeling much better after drain placement, now with just some pain around drain Diarrhea seems to be slowing or resolving as well No nausea/vomiting, interested in trying some liquids  Objective: Vital signs in last 24 hours: Temp:  [98.2 F (36.8 C)-100.6 F (38.1 C)] 98.7 F (37.1 C) (06/04 0607) Pulse Rate:  [100-136] 104 (06/04 0607) Resp:  [16-35] 16 (06/04 0607) BP: (105-136)/(63-117) 110/73 (06/04 0607) SpO2:  [97 %-100 %] 97 % (06/04 0607) Last BM Date: 11/07/20  Intake/Output from previous day: 06/03 0701 - 06/04 0700 In: 25 [I.V.:5] Out: 1550 [Urine:1125; Drains:425] Intake/Output this shift: No intake/output data recorded.  PE: General: pleasant, WD, female who is laying in bed in NAD HEENT: head is normocephalic, atraumatic. Mouth is pink and moist Heart: no cyanosis.  Palpable radial pulses bilaterally Lungs: Respiratory effort nonlabored Abd: soft, obese, IR drains in left abdomen with minimal drainage in bag MS: strength/sensation intact Skin: warm and dry with no masses, lesions, or rashes Psych: A&Ox3 with an appropriate affect.    Lab Results:  Recent Labs    11/07/20 0402 11/08/20 0421  WBC 20.9* 19.2*  HGB 8.3* 8.0*  HCT 27.6* 26.6*  PLT 772* 710*   BMET Recent Labs    11/07/20 0402 11/08/20 0421  NA 136 135  K 4.2 4.8  CL 101 102  CO2 27 26  GLUCOSE 105* 99  BUN 7 6  CREATININE 0.74 0.71  CALCIUM 8.6* 8.3*   PT/INR Recent Labs    11/07/20 1820  LABPROT 15.7*  INR 1.3*   CMP     Component Value Date/Time   NA 135 11/08/2020 0421   NA 139 12/21/2019 1024   K 4.8 11/08/2020 0421   CL 102 11/08/2020 0421   CO2 26 11/08/2020 0421   GLUCOSE 99 11/08/2020 0421   BUN 6 11/08/2020 0421   BUN 13 12/21/2019 1024   CREATININE 0.71 11/08/2020 0421   CALCIUM 8.3 (L) 11/08/2020 0421   PROT 7.9 11/04/2020 1119   PROT 7.4 07/11/2019 1638   ALBUMIN 2.5 (L) 11/04/2020 1119   ALBUMIN 4.6 07/11/2019  1638   AST 15 11/04/2020 1119   ALT 12 11/04/2020 1119   ALKPHOS 66 11/04/2020 1119   BILITOT 0.4 11/04/2020 1119   BILITOT 0.2 07/11/2019 1638   GFRNONAA >60 11/08/2020 0421   GFRAA 96 12/21/2019 1024   Lipase     Component Value Date/Time   LIPASE 21 11/04/2020 1119       Studies/Results: CT ABDOMEN PELVIS WO CONTRAST  Result Date: 11/07/2020 CLINICAL DATA:  Abdominal abscess or infection. EXAM: CT ABDOMEN AND PELVIS WITHOUT CONTRAST TECHNIQUE: Multidetector CT imaging of the abdomen and pelvis was performed following the standard protocol without IV contrast. COMPARISON:  Nov 04, 2020. FINDINGS: Lower chest: No acute abnormality. Hepatobiliary: Cholelithiasis is noted without biliary dilatation. Liver is unremarkable. Pancreas: Unremarkable. No pancreatic ductal dilatation or surrounding inflammatory changes. Spleen: Normal in size without focal abnormality. Adrenals/Urinary Tract: Stable left adrenal adenoma. Right adrenal gland appears normal. Stable left renal cyst. No hydronephrosis or renal obstruction is noted. No renal or ureteral calculi are noted. Urinary bladder is unremarkable. Stomach/Bowel: The stomach appears normal. The appendix appears normal. There is no evidence of bowel obstruction. Continued wall thickening of descending colon is noted suggesting infectious or inflammatory colitis or diverticulitis. 7.0 x 5.0 cm adjacent fluid collection is noted consistent with abscess which is enlarged compared to prior exam. There is also  noted severe wall thickening involving adjacent small bowel loop, most consistent with secondary inflammation. 5.2 x 3.5 cm fluid collection is noted within the left psoas muscle which is significantly enlarged compared to prior exam. 4.4 x 3.8 cm fluid collection is noted in left retroperitoneal region which is also enlarged compared to prior exam. Vascular/Lymphatic: No significant vascular findings are present. No enlarged abdominal or pelvic lymph  nodes. Reproductive: Fibroid uterus is again noted. No adnexal abnormality is noted. Other: No abdominal wall hernia or abnormality. No abdominopelvic ascites. Musculoskeletal: No acute or significant osseous findings. IMPRESSION: Continued wall thickening is seen involving descending colon suggesting infectious or inflammatory colitis or perforated diverticulitis. There is an adjacent fluid collection measuring 7.0 x 5.0 cm consistent with abscess which is significantly enlarged compared to prior exam. Adjacent to the abscess, there is a severely thickened small bowel loop most consistent with secondary inflammation. 5.2 x 3.5 cm fluid collection is noted within the left psoas muscle consistent with abscess which is significantly enlarged compared to prior exam. 4.4 x 3.8 cm fluid collection consistent with abscess is noted in the left retroperitoneal region which is also enlarged compared to prior exam. Fibroid uterus. Cholelithiasis. Electronically Signed   By: Marijo Conception M.D.   On: 11/07/2020 15:32   CT IMAGE GUIDED DRAINAGE BY PERCUTANEOUS CATHETER  Result Date: 11/07/2020 INDICATION: Diverticular abscesses. Please perform percutaneous drainage catheter placement for infection source control purposes. EXAM: 1. CT-GUIDED RETROPERITONEAL ABSCESS DRAINAGE CATHETER PLACEMENT 2. CT-GUIDED ABDOMINAL DRAINAGE CATHETER PLACEMENT COMPARISON:  CT abdomen pelvis-earlier same day; 11/05/2018 MEDICATIONS: The patient is currently admitted to the hospital and receiving intravenous antibiotics. The antibiotics were administered within an appropriate time frame prior to the initiation of the procedure. ANESTHESIA/SEDATION: Moderate (conscious) sedation was employed during this procedure. A total of Versed 4 mg and Fentanyl 200 mcg was administered intravenously. Moderate Sedation Time: 41 minutes. The patient's level of consciousness and vital signs were monitored continuously by radiology nursing throughout the  procedure under my direct supervision. CONTRAST:  None COMPLICATIONS: None immediate. PROCEDURE: Informed written consent was obtained from the patient after a discussion of the risks, benefits and alternatives to treatment. The patient was placed supine on the CT gantry and a pre procedural CT was performed re-demonstrating the known bilobed abscess/fluid collection within the left retro to peritoneal space with more lateral component measuring approximately 4.4 x 3.8 cm (image 52, series 4) and more medially located collection measuring approximately 5.2 x 4.5 cm (image 56, series 4), as well as a an interloop abscess measuring at least 6.9 x 4.5 cm (image 52, series 4). The procedures were planned. A timeout was performed prior to the initiation of the procedure. The skin overlying the left lateral abdomen was prepped and draped in the usual sterile fashion. The overlying soft tissues were anesthetized with 1% lidocaine with epinephrine. Appropriate trajectory was planned with the use of a 22 gauge spinal needle. Under intermittent CT guidance, both collections were targeted with 18 gauge trocar needles ultimately allowing placement of Amplatz wires within both collections. Note was made to avoid adjacent loop of small bowel when targeting the interloop abscess. Ultimately, the adjacent retroperitoneal collections were successfully cannulated in a through and through manner allowing placement of a 12 French multi sidehole (biliary type) percutaneous drainage catheter with end coiled and locked within the more medial aspect of the collection and sideholes traversing through the smaller lateral collection. Approximately 150 cc of purulent fluid was aspirated from the retroperitoneal  drainage catheter Ultimately, the interloop abscess was cannulated allowing placement of a 10 French drainage catheter yielding 75 cc of purulent fluid. A representative sample of aspirated fluid was capped and sent to the laboratory  for analysis Postprocedural imaging was obtained and the procedure was terminated. Both drainage catheters were secured at the skin entrance site within interrupted sutures and StatLock devices. Both drainage catheters were connected to gravity bags. Dressings were applied. The patient tolerated the procedure well without immediate post procedural complication. IMPRESSION: 1. Successful CT guided placement of a 12 French multi sidehole (biliary type) drain catheter into the adjacent retroperitoneal abscesses via left lateral approach with aspiration of 150 mL of purulent fluid. 2. Successful CT-guided placement of a 10 French drainage catheter into interloop abscess via left ventral lateral approach yielding 75 cc of purulent fluid. 3. A representative sample of aspirated fluid was capped and sent to laboratory for analysis. Electronically Signed   By: Sandi Mariscal M.D.   On: 11/07/2020 18:10   CT IMAGE GUIDED DRAINAGE PERCUT CATH  PERITONEAL RETROPERIT  Result Date: 11/07/2020 INDICATION: Diverticular abscesses. Please perform percutaneous drainage catheter placement for infection source control purposes. EXAM: 1. CT-GUIDED RETROPERITONEAL ABSCESS DRAINAGE CATHETER PLACEMENT 2. CT-GUIDED ABDOMINAL DRAINAGE CATHETER PLACEMENT COMPARISON:  CT abdomen pelvis-earlier same day; 11/05/2018 MEDICATIONS: The patient is currently admitted to the hospital and receiving intravenous antibiotics. The antibiotics were administered within an appropriate time frame prior to the initiation of the procedure. ANESTHESIA/SEDATION: Moderate (conscious) sedation was employed during this procedure. A total of Versed 4 mg and Fentanyl 200 mcg was administered intravenously. Moderate Sedation Time: 41 minutes. The patient's level of consciousness and vital signs were monitored continuously by radiology nursing throughout the procedure under my direct supervision. CONTRAST:  None COMPLICATIONS: None immediate. PROCEDURE: Informed written  consent was obtained from the patient after a discussion of the risks, benefits and alternatives to treatment. The patient was placed supine on the CT gantry and a pre procedural CT was performed re-demonstrating the known bilobed abscess/fluid collection within the left retro to peritoneal space with more lateral component measuring approximately 4.4 x 3.8 cm (image 52, series 4) and more medially located collection measuring approximately 5.2 x 4.5 cm (image 56, series 4), as well as a an interloop abscess measuring at least 6.9 x 4.5 cm (image 52, series 4). The procedures were planned. A timeout was performed prior to the initiation of the procedure. The skin overlying the left lateral abdomen was prepped and draped in the usual sterile fashion. The overlying soft tissues were anesthetized with 1% lidocaine with epinephrine. Appropriate trajectory was planned with the use of a 22 gauge spinal needle. Under intermittent CT guidance, both collections were targeted with 18 gauge trocar needles ultimately allowing placement of Amplatz wires within both collections. Note was made to avoid adjacent loop of small bowel when targeting the interloop abscess. Ultimately, the adjacent retroperitoneal collections were successfully cannulated in a through and through manner allowing placement of a 12 French multi sidehole (biliary type) percutaneous drainage catheter with end coiled and locked within the more medial aspect of the collection and sideholes traversing through the smaller lateral collection. Approximately 150 cc of purulent fluid was aspirated from the retroperitoneal drainage catheter Ultimately, the interloop abscess was cannulated allowing placement of a 10 French drainage catheter yielding 75 cc of purulent fluid. A representative sample of aspirated fluid was capped and sent to the laboratory for analysis Postprocedural imaging was obtained and the procedure was terminated. Both  drainage catheters were  secured at the skin entrance site within interrupted sutures and StatLock devices. Both drainage catheters were connected to gravity bags. Dressings were applied. The patient tolerated the procedure well without immediate post procedural complication. IMPRESSION: 1. Successful CT guided placement of a 12 French multi sidehole (biliary type) drain catheter into the adjacent retroperitoneal abscesses via left lateral approach with aspiration of 150 mL of purulent fluid. 2. Successful CT-guided placement of a 10 French drainage catheter into interloop abscess via left ventral lateral approach yielding 75 cc of purulent fluid. 3. A representative sample of aspirated fluid was capped and sent to laboratory for analysis. Electronically Signed   By: Sandi Mariscal M.D.   On: 11/07/2020 18:10    Anti-infectives: Anti-infectives (From admission, onward)   Start     Dose/Rate Route Frequency Ordered Stop   11/04/20 2200  meropenem (MERREM) 2 g in sodium chloride 0.9 % 100 mL IVPB        2 g 200 mL/hr over 30 Minutes Intravenous Every 8 hours 11/04/20 1353     11/04/20 1730  metroNIDAZOLE (FLAGYL) tablet 2,000 mg        2,000 mg Oral  Once 11/04/20 1717 11/05/20 1818   11/04/20 1400  meropenem (MERREM) 1 g in sodium chloride 0.9 % 100 mL IVPB        1 g 200 mL/hr over 30 Minutes Intravenous Every 1 hr x 2 11/04/20 1342 11/04/20 1521       Assessment/Plan Anemia - 8.3 (7.7) following 2 u PRBCs 6/1. Monitor Hgb Hypokalemia - resolved following PO replacement - monitor  Perforated diverticulitis - Feeling better after IR drain placement - continue IV merrem  - Full liquid diet - ambulate  - Leukocytosis persists with slight decrease today, recheck tomorrow  FEN: FLD VTE: lovenox ID: flagyl 5/31, meropenem 5/31>>  Dispo: continue IV abx and monitor WBC and diet. she will need outpatient follow up with GI for colonoscopy.     LOS: 4 days    Felicie Morn, [facility] 11/08/2020, 8:26 AM Please see Amion for pager number during day hours 7:00am-4:30pm

## 2020-11-09 LAB — BASIC METABOLIC PANEL
Anion gap: 10 (ref 5–15)
BUN: 6 mg/dL (ref 6–20)
CO2: 26 mmol/L (ref 22–32)
Calcium: 8.8 mg/dL — ABNORMAL LOW (ref 8.9–10.3)
Chloride: 100 mmol/L (ref 98–111)
Creatinine, Ser: 0.47 mg/dL (ref 0.44–1.00)
GFR, Estimated: >60 mL/min (ref >60)
Glucose, Bld: 103 mg/dL — ABNORMAL HIGH (ref 70–99)
Potassium: 4.2 mmol/L (ref 3.5–5.1)
Sodium: 136 mmol/L (ref 135–145)

## 2020-11-09 LAB — CULTURE, BLOOD (ROUTINE X 2)
Culture: NO GROWTH
Culture: NO GROWTH
Special Requests: ADEQUATE
Special Requests: ADEQUATE

## 2020-11-09 LAB — CBC
HCT: 27.4 % — ABNORMAL LOW (ref 36.0–46.0)
Hemoglobin: 8.2 g/dL — ABNORMAL LOW (ref 12.0–15.0)
MCH: 22.4 pg — ABNORMAL LOW (ref 26.0–34.0)
MCHC: 29.9 g/dL — ABNORMAL LOW (ref 30.0–36.0)
MCV: 74.9 fL — ABNORMAL LOW (ref 80.0–100.0)
Platelets: 675 10*3/uL — ABNORMAL HIGH (ref 150–400)
RBC: 3.66 MIL/uL — ABNORMAL LOW (ref 3.87–5.11)
RDW: 22.5 % — ABNORMAL HIGH (ref 11.5–15.5)
WBC: 17.2 10*3/uL — ABNORMAL HIGH (ref 4.0–10.5)
nRBC: 0.2 % (ref 0.0–0.2)

## 2020-11-09 NOTE — Progress Notes (Signed)
Progress Note     Subjective: Continued improvement in symptoms since drain placement - pain improving, diarrhea resolving. Pain with rapid flushes of drain, have to flush slowly Tolerated liquid diet yesterday, regular diet sounds good today.  Objective: Vital signs in last 24 hours: Temp:  [98.4 F (36.9 C)-98.6 F (37 C)] 98.5 F (36.9 C) (06/05 0515) Pulse Rate:  [102-110] 104 (06/05 0515) Resp:  [18] 18 (06/05 0515) BP: (108-126)/(69-79) 117/79 (06/05 0515) SpO2:  [97 %-100 %] 100 % (06/05 0515) Last BM Date: 11/07/20  Intake/Output from previous day: 06/04 0701 - 06/05 0700 In: 871.5 [P.O.:120; IV Piggyback:735.5] Out: 2400 [Urine:2400] Intake/Output this shift: No intake/output data recorded.  PE: General: pleasant, WD, female who is laying in bed in NAD HEENT: head is normocephalic, atraumatic. Mouth is pink and moist Heart: no cyanosis.  Palpable radial pulses bilaterally Lungs: Respiratory effort nonlabored Abd: soft, obese, IR drains in left abdomen with minimal drainage in bag MS: strength/sensation intact Skin: warm and dry with no masses, lesions, or rashes Psych: A&Ox3 with an appropriate affect.    Lab Results:  Recent Labs    11/08/20 0421 11/09/20 0356  WBC 19.2* 17.2*  HGB 8.0* 8.2*  HCT 26.6* 27.4*  PLT 710* 675*   BMET Recent Labs    11/08/20 0421 11/09/20 0356  NA 135 136  K 4.8 4.2  CL 102 100  CO2 26 26  GLUCOSE 99 103*  BUN 6 6  CREATININE 0.71 0.47  CALCIUM 8.3* 8.8*   PT/INR Recent Labs    11/07/20 1820  LABPROT 15.7*  INR 1.3*   CMP     Component Value Date/Time   NA 136 11/09/2020 0356   NA 139 12/21/2019 1024   K 4.2 11/09/2020 0356   CL 100 11/09/2020 0356   CO2 26 11/09/2020 0356   GLUCOSE 103 (H) 11/09/2020 0356   BUN 6 11/09/2020 0356   BUN 13 12/21/2019 1024   CREATININE 0.47 11/09/2020 0356   CALCIUM 8.8 (L) 11/09/2020 0356   PROT 7.9 11/04/2020 1119   PROT 7.4 07/11/2019 1638   ALBUMIN 2.5 (L)  11/04/2020 1119   ALBUMIN 4.6 07/11/2019 1638   AST 15 11/04/2020 1119   ALT 12 11/04/2020 1119   ALKPHOS 66 11/04/2020 1119   BILITOT 0.4 11/04/2020 1119   BILITOT 0.2 07/11/2019 1638   GFRNONAA >60 11/09/2020 0356   GFRAA 96 12/21/2019 1024   Lipase     Component Value Date/Time   LIPASE 21 11/04/2020 1119       Studies/Results: CT ABDOMEN PELVIS WO CONTRAST  Result Date: 11/07/2020 CLINICAL DATA:  Abdominal abscess or infection. EXAM: CT ABDOMEN AND PELVIS WITHOUT CONTRAST TECHNIQUE: Multidetector CT imaging of the abdomen and pelvis was performed following the standard protocol without IV contrast. COMPARISON:  Nov 04, 2020. FINDINGS: Lower chest: No acute abnormality. Hepatobiliary: Cholelithiasis is noted without biliary dilatation. Liver is unremarkable. Pancreas: Unremarkable. No pancreatic ductal dilatation or surrounding inflammatory changes. Spleen: Normal in size without focal abnormality. Adrenals/Urinary Tract: Stable left adrenal adenoma. Right adrenal gland appears normal. Stable left renal cyst. No hydronephrosis or renal obstruction is noted. No renal or ureteral calculi are noted. Urinary bladder is unremarkable. Stomach/Bowel: The stomach appears normal. The appendix appears normal. There is no evidence of bowel obstruction. Continued wall thickening of descending colon is noted suggesting infectious or inflammatory colitis or diverticulitis. 7.0 x 5.0 cm adjacent fluid collection is noted consistent with abscess which is enlarged compared to prior  exam. There is also noted severe wall thickening involving adjacent small bowel loop, most consistent with secondary inflammation. 5.2 x 3.5 cm fluid collection is noted within the left psoas muscle which is significantly enlarged compared to prior exam. 4.4 x 3.8 cm fluid collection is noted in left retroperitoneal region which is also enlarged compared to prior exam. Vascular/Lymphatic: No significant vascular findings are  present. No enlarged abdominal or pelvic lymph nodes. Reproductive: Fibroid uterus is again noted. No adnexal abnormality is noted. Other: No abdominal wall hernia or abnormality. No abdominopelvic ascites. Musculoskeletal: No acute or significant osseous findings. IMPRESSION: Continued wall thickening is seen involving descending colon suggesting infectious or inflammatory colitis or perforated diverticulitis. There is an adjacent fluid collection measuring 7.0 x 5.0 cm consistent with abscess which is significantly enlarged compared to prior exam. Adjacent to the abscess, there is a severely thickened small bowel loop most consistent with secondary inflammation. 5.2 x 3.5 cm fluid collection is noted within the left psoas muscle consistent with abscess which is significantly enlarged compared to prior exam. 4.4 x 3.8 cm fluid collection consistent with abscess is noted in the left retroperitoneal region which is also enlarged compared to prior exam. Fibroid uterus. Cholelithiasis. Electronically Signed   By: Marijo Conception M.D.   On: 11/07/2020 15:32   CT IMAGE GUIDED DRAINAGE BY PERCUTANEOUS CATHETER  Result Date: 11/07/2020 INDICATION: Diverticular abscesses. Please perform percutaneous drainage catheter placement for infection source control purposes. EXAM: 1. CT-GUIDED RETROPERITONEAL ABSCESS DRAINAGE CATHETER PLACEMENT 2. CT-GUIDED ABDOMINAL DRAINAGE CATHETER PLACEMENT COMPARISON:  CT abdomen pelvis-earlier same day; 11/05/2018 MEDICATIONS: The patient is currently admitted to the hospital and receiving intravenous antibiotics. The antibiotics were administered within an appropriate time frame prior to the initiation of the procedure. ANESTHESIA/SEDATION: Moderate (conscious) sedation was employed during this procedure. A total of Versed 4 mg and Fentanyl 200 mcg was administered intravenously. Moderate Sedation Time: 41 minutes. The patient's level of consciousness and vital signs were monitored  continuously by radiology nursing throughout the procedure under my direct supervision. CONTRAST:  None COMPLICATIONS: None immediate. PROCEDURE: Informed written consent was obtained from the patient after a discussion of the risks, benefits and alternatives to treatment. The patient was placed supine on the CT gantry and a pre procedural CT was performed re-demonstrating the known bilobed abscess/fluid collection within the left retro to peritoneal space with more lateral component measuring approximately 4.4 x 3.8 cm (image 52, series 4) and more medially located collection measuring approximately 5.2 x 4.5 cm (image 56, series 4), as well as a an interloop abscess measuring at least 6.9 x 4.5 cm (image 52, series 4). The procedures were planned. A timeout was performed prior to the initiation of the procedure. The skin overlying the left lateral abdomen was prepped and draped in the usual sterile fashion. The overlying soft tissues were anesthetized with 1% lidocaine with epinephrine. Appropriate trajectory was planned with the use of a 22 gauge spinal needle. Under intermittent CT guidance, both collections were targeted with 18 gauge trocar needles ultimately allowing placement of Amplatz wires within both collections. Note was made to avoid adjacent loop of small bowel when targeting the interloop abscess. Ultimately, the adjacent retroperitoneal collections were successfully cannulated in a through and through manner allowing placement of a 12 French multi sidehole (biliary type) percutaneous drainage catheter with end coiled and locked within the more medial aspect of the collection and sideholes traversing through the smaller lateral collection. Approximately 150 cc of purulent fluid was  aspirated from the retroperitoneal drainage catheter Ultimately, the interloop abscess was cannulated allowing placement of a 10 French drainage catheter yielding 75 cc of purulent fluid. A representative sample of  aspirated fluid was capped and sent to the laboratory for analysis Postprocedural imaging was obtained and the procedure was terminated. Both drainage catheters were secured at the skin entrance site within interrupted sutures and StatLock devices. Both drainage catheters were connected to gravity bags. Dressings were applied. The patient tolerated the procedure well without immediate post procedural complication. IMPRESSION: 1. Successful CT guided placement of a 12 French multi sidehole (biliary type) drain catheter into the adjacent retroperitoneal abscesses via left lateral approach with aspiration of 150 mL of purulent fluid. 2. Successful CT-guided placement of a 10 French drainage catheter into interloop abscess via left ventral lateral approach yielding 75 cc of purulent fluid. 3. A representative sample of aspirated fluid was capped and sent to laboratory for analysis. Electronically Signed   By: Sandi Mariscal M.D.   On: 11/07/2020 18:10   CT IMAGE GUIDED DRAINAGE PERCUT CATH  PERITONEAL RETROPERIT  Result Date: 11/07/2020 INDICATION: Diverticular abscesses. Please perform percutaneous drainage catheter placement for infection source control purposes. EXAM: 1. CT-GUIDED RETROPERITONEAL ABSCESS DRAINAGE CATHETER PLACEMENT 2. CT-GUIDED ABDOMINAL DRAINAGE CATHETER PLACEMENT COMPARISON:  CT abdomen pelvis-earlier same day; 11/05/2018 MEDICATIONS: The patient is currently admitted to the hospital and receiving intravenous antibiotics. The antibiotics were administered within an appropriate time frame prior to the initiation of the procedure. ANESTHESIA/SEDATION: Moderate (conscious) sedation was employed during this procedure. A total of Versed 4 mg and Fentanyl 200 mcg was administered intravenously. Moderate Sedation Time: 41 minutes. The patient's level of consciousness and vital signs were monitored continuously by radiology nursing throughout the procedure under my direct supervision. CONTRAST:  None  COMPLICATIONS: None immediate. PROCEDURE: Informed written consent was obtained from the patient after a discussion of the risks, benefits and alternatives to treatment. The patient was placed supine on the CT gantry and a pre procedural CT was performed re-demonstrating the known bilobed abscess/fluid collection within the left retro to peritoneal space with more lateral component measuring approximately 4.4 x 3.8 cm (image 52, series 4) and more medially located collection measuring approximately 5.2 x 4.5 cm (image 56, series 4), as well as a an interloop abscess measuring at least 6.9 x 4.5 cm (image 52, series 4). The procedures were planned. A timeout was performed prior to the initiation of the procedure. The skin overlying the left lateral abdomen was prepped and draped in the usual sterile fashion. The overlying soft tissues were anesthetized with 1% lidocaine with epinephrine. Appropriate trajectory was planned with the use of a 22 gauge spinal needle. Under intermittent CT guidance, both collections were targeted with 18 gauge trocar needles ultimately allowing placement of Amplatz wires within both collections. Note was made to avoid adjacent loop of small bowel when targeting the interloop abscess. Ultimately, the adjacent retroperitoneal collections were successfully cannulated in a through and through manner allowing placement of a 12 French multi sidehole (biliary type) percutaneous drainage catheter with end coiled and locked within the more medial aspect of the collection and sideholes traversing through the smaller lateral collection. Approximately 150 cc of purulent fluid was aspirated from the retroperitoneal drainage catheter Ultimately, the interloop abscess was cannulated allowing placement of a 10 French drainage catheter yielding 75 cc of purulent fluid. A representative sample of aspirated fluid was capped and sent to the laboratory for analysis Postprocedural imaging was obtained and the  procedure was terminated. Both drainage catheters were secured at the skin entrance site within interrupted sutures and StatLock devices. Both drainage catheters were connected to gravity bags. Dressings were applied. The patient tolerated the procedure well without immediate post procedural complication. IMPRESSION: 1. Successful CT guided placement of a 12 French multi sidehole (biliary type) drain catheter into the adjacent retroperitoneal abscesses via left lateral approach with aspiration of 150 mL of purulent fluid. 2. Successful CT-guided placement of a 10 French drainage catheter into interloop abscess via left ventral lateral approach yielding 75 cc of purulent fluid. 3. A representative sample of aspirated fluid was capped and sent to laboratory for analysis. Electronically Signed   By: Sandi Mariscal M.D.   On: 11/07/2020 18:10    Anti-infectives: Anti-infectives (From admission, onward)   Start     Dose/Rate Route Frequency Ordered Stop   11/04/20 2200  meropenem (MERREM) 2 g in sodium chloride 0.9 % 100 mL IVPB        2 g 200 mL/hr over 30 Minutes Intravenous Every 8 hours 11/04/20 1353     11/04/20 1730  metroNIDAZOLE (FLAGYL) tablet 2,000 mg        2,000 mg Oral  Once 11/04/20 1717 11/05/20 1818   11/04/20 1400  meropenem (MERREM) 1 g in sodium chloride 0.9 % 100 mL IVPB        1 g 200 mL/hr over 30 Minutes Intravenous Every 1 hr x 2 11/04/20 1342 11/04/20 1521       Assessment/Plan Anemia - 8.3 (7.7) following 2 u PRBCs 6/1. Monitor Hgb Hypokalemia - resolved following PO replacement - monitor  Perforated diverticulitis - Feeling better after IR drain placement - continue IV merrem  - Regular diet - ambulate  - Continue to monitor for resolution of leukocytosis since drain placement  FEN: FLD VTE: lovenox ID: flagyl 5/31, meropenem 5/31>>  Dispo: continue IV abx and monitor WBC and diet tolerance. she will need outpatient follow up with GI for colonoscopy.     LOS: 5  days    Felicie Morn, [facility] 11/09/2020, 7:39 AM Please see Amion for pager number during day hours 7:00am-4:30pm

## 2020-11-09 NOTE — Progress Notes (Signed)
Pt states that drain on the left anterior abdomen is "burning" when flushed. RN attempted to flush slowly. Pt only tolerated 3 ml of fluid before requesting RN to stop flushing. Pt complains of no pain related to her left lower quadrant drain.

## 2020-11-10 ENCOUNTER — Other Ambulatory Visit (HOSPITAL_COMMUNITY): Payer: Self-pay

## 2020-11-10 LAB — CBC
HCT: 27.3 % — ABNORMAL LOW (ref 36.0–46.0)
Hemoglobin: 8.1 g/dL — ABNORMAL LOW (ref 12.0–15.0)
MCH: 22.1 pg — ABNORMAL LOW (ref 26.0–34.0)
MCHC: 29.7 g/dL — ABNORMAL LOW (ref 30.0–36.0)
MCV: 74.4 fL — ABNORMAL LOW (ref 80.0–100.0)
Platelets: 588 10*3/uL — ABNORMAL HIGH (ref 150–400)
RBC: 3.67 MIL/uL — ABNORMAL LOW (ref 3.87–5.11)
RDW: 22.8 % — ABNORMAL HIGH (ref 11.5–15.5)
WBC: 12.1 10*3/uL — ABNORMAL HIGH (ref 4.0–10.5)
nRBC: 0.2 % (ref 0.0–0.2)

## 2020-11-10 MED ORDER — SODIUM CHLORIDE 0.9% FLUSH: 5.0000 mL | Freq: Three times a day (TID) | INTRAVENOUS | 1 refills | Status: DC

## 2020-11-10 MED ORDER — CEFDINIR 300 MG PO CAPS
300.0000 mg | ORAL_CAPSULE | Freq: Two times a day (BID) | ORAL | 0 refills | Status: AC
  Filled 2020-11-10: qty 14, 7d supply, fill #0

## 2020-11-10 MED ORDER — METRONIDAZOLE 500 MG PO TABS
500.0000 mg | ORAL_TABLET | Freq: Two times a day (BID) | ORAL | 0 refills | Status: DC
  Filled 2020-11-10: qty 14, 7d supply, fill #0

## 2020-11-10 NOTE — Discharge Summary (Signed)
[facility] Discharge Summary   Patient ID: Mikayla Rios MRN: [id number] DOB/AGE: 1985-08-16 35 y.o.  Admit date: 11/04/2020 Discharge date: 11/10/2020  Admitting Diagnosis: Perforated diverticulitis with abscesses  Discharge Diagnosis Perforated diverticulitis with abscess  Consultants Interventional radiology   Imaging: No results found.  Procedures Dr. Sandi Mariscal (11/07/20) - CT guided drain placement x2  Hospital Course:  Patient is a 35 year old female who presented to [facility] with abdominal pain.  Workup showed perforated diverticulitis.  Patient was transferred to [facility] for surgical evaluation, admitted and started on IV antibiotics and bowel rest. Worsening leukocytosis on 6/3, so repeat CT ordered which showed diverticular abscesses amenable to drainage and IR consulted. Procedure done as listed above and patient tolerated well. Diet was advanced as tolerated. Discussed antibiotics with pharmacy who recommended discharge on cefdinir and flagyl based on culture results.   On 11/10/20, the patient was voiding well, tolerating diet, ambulating well, pain well controlled, vital signs stable, drains stable and overall felt stable for discharge home.  Patient will follow up in our office in 2-3 weeks and knows to call with questions or concerns. She will follow up with IR for drain studies as well. She will call to confirm appointment date/time.    Physical Exam: General: pleasant, WD, female who is laying in bed in NAD HEENT: head is normocephalic, atraumatic. Mouth is pink and moist Heart: no cyanosis.  Palpable radial pulses bilaterally Lungs: Respiratory effort nonlabored Abd: soft, obese, IR drains in left abdomen with minimal serous drainage in bag Skin: warm and dry with no masses, lesions, or rashes Psych: A&Ox3 with an appropriate affect.     Allergies as of 11/10/2020      Reactions   Shellfish Allergy Hives   Penicillins Hives, Rash   Has patient had  a PCN reaction causing immediate rash, facial/tongue/throat swelling, SOB or lightheadedness with hypotension: No Has patient had a PCN reaction causing severe rash involving mucus membranes or skin necrosis: hives Has patient had a PCN reaction that required hospitalization No Has patient had a PCN reaction occurring within the last 10 years: yes If all of the above answers are "NO", then may proceed with Cephalosporin use.      Medication List    STOP taking these medications   dicyclomine 10 MG capsule Commonly known as: Bentyl   predniSONE 20 MG tablet Commonly known as: DELTASONE     TAKE these medications   acetaminophen 650 MG CR tablet Commonly known as: TYLENOL Take 1,300 mg by mouth every 8 (eight) hours as needed for pain.   amLODipine 10 MG tablet Commonly known as: NORVASC Take 1 tablet (10 mg total) by mouth daily.   benzonatate 100 MG capsule Commonly known as: Tessalon Perles Take 1 capsule (100 mg total) by mouth every 6 (six) hours as needed. What changed: reasons to take this   cefdinir 300 MG capsule Commonly known as: OMNICEF Take 1 capsule (300 mg total) by mouth 2 (two) times daily for 7 days.   lisinopril-hydrochlorothiazide 20-12.5 MG tablet Commonly known as: ZESTORETIC Take 2 tablets by mouth daily.   metoprolol succinate 25 MG 24 hr tablet Commonly known as: TOPROL-XL Take 1 tablet (25 mg total) by mouth 2 (two) times daily.   metroNIDAZOLE 500 MG tablet Commonly known as: Flagyl Take 1 tablet (500 mg total) by mouth 2 (two) times daily with a meal. DO NOT CONSUME ALCOHOL WHILE TAKING THIS MEDICATION.   PROBIOTIC PO Take 1  capsule by mouth daily.   sodium chloride flush 0.9 % Soln Commonly known as: NS 5 mLs by Intracatheter route every 8 (eight) hours.         Follow-up Information    Sandi Mariscal, MD Follow up in 1 week(s).   Specialties: Interventional Radiology, Radiology Why: They should call you with appointment date and  time Contact information: [address] [place] [address] [phone]        Leighton Ruff, MD Follow up on 12/02/2020.   Specialties: General Surgery, Colon and Rectal Surgery Why: 10:50am, arrive by 9:40am for paperwork and check in process Contact information: [address] [place] [address] [phone]        [name], MD. Call.   Specialty: Internal Medicine Why: Call and have your PCP refer you to GI for colonoscopy in 6-8 weeks.  Contact information: [address] [place] [address] [phone]               Signed: Norm Parcel , [facility] 11/10/2020, 10:47 AM Please see Amion for pager number during day hours 7:00am-4:30pm

## 2020-11-10 NOTE — Discharge Instructions (Signed)
[email or web address]  Percutaneous Abscess Drain Placement, Care After This sheet gives you information about how to care for yourself after your procedure. Your health care provider may also give you more specific instructions. If you have problems or questions, contact your health care provider. What can I expect after the procedure? After the procedure, it is common to have:  A small amount of bruising and discomfort in the area where the drainage tube (catheter) was placed.  Sleepiness and fatigue. This should go away after the medicines you were given have worn off. Follow these instructions at home: Incision care  Follow instructions from your health care provider about how to take care of your incision. Make sure you: ? Wash your hands with soap and water for at least 20 seconds before and after you change your bandage (dressing). If soap and water are not available, use hand sanitizer. ? Change your dressing as told by your health care provider. ? Leave stitches (sutures), skin glue, or adhesive strips in place. These skin closures may need to stay in place for 2 weeks or longer. If adhesive strip edges start to loosen and curl up, you may trim the loose edges. Do not remove adhesive strips completely unless your health care provider tells you to do that.  Check your incision area every day for signs of infection. Check for: ? More redness, swelling, or pain. ? More fluid or blood. ? Warmth. ? Pus or a bad smell.   Catheter care  Follow instructions from your health care provider about emptying and cleaning your catheter and collection bag or drainage bulb. You may need to clean the catheter every day so it does not clog.  If directed, write down the following information every time you empty your bag: ? The date and time. ? The amount of drainage.  Check for fluid leaking from around your catheter (instead of fluid draining through your catheter). This may be a sign  that the drain is no longer working correctly. Activity  Rest at home for 1-2 days after your procedure.  Return to your normal activities as told by your health care provider. Ask your health care provider what activities are safe for you.  If you were given a sedative during the procedure, it can affect you for several hours. Do not drive or operate machinery until your health care provider says that it is safe. General instructions  Take over-the-counter and prescription medicines only as told by your health care provider.  If you were prescribed an antibiotic medicine, take it as told by your health care provider. Do not stop using the antibiotic even if you start to feel better.  Do not take showers, take baths, swim, or use a hot tub for 24 hours after your procedure or until your health care provider says that this is okay.  Do not use any products that contain nicotine or tobacco, such as cigarettes, e-cigarettes, and chewing tobacco. If you need help quitting, ask your health care provider.  Keep all follow-up visits as told by your health care provider. This is important. Contact a health care provider if:  You have less than 10 mL of drainage a day for 2-3 days in a row, or as directed by your health care provider.  You have any of these signs of infection: ? More redness, swelling, or pain around your incision area. ? More fluid or blood coming from your incision area. ? Warmth coming from your incision area. ? Pus or  a bad smell coming from your incision area.  You have fluid leaking from around your catheter (instead of through your catheter).  You have a fever or chills.  You have pain that does not get better with medicine. Get help right away if:  Your catheter comes out.  You suddenly stop having drainage from your catheter.  You suddenly have blood in the fluid that is draining from your catheter.  You become dizzy or you faint.  You develop a  rash.  You have nausea or vomiting.  You have difficulty breathing or you feel short of breath.  You develop chest pain.  You have problems with your speech or vision.  You have trouble balancing or moving your arms or legs. Summary  It is common to have a small amount of bruising and discomfort in the area where the drainage tube (catheter) was placed.  Follow instructions from your health care provider about emptying and cleaning your catheter and collection bag or drainage bulb.  You may be directed to record the amount of drainage from the bag every time you empty it.  Contact a health care provider if you have more redness, swelling, or pain around your incision area or if you have pain that does not get better with medicine. This information is not intended to replace advice given to you by your health care provider. Make sure you discuss any questions you have with your health care provider. Document Revised: 05/19/2019 Document Reviewed: 05/19/2019 Elsevier Patient Education  2021 [place] x6 weeks, then switch to high fiber Fiber is found in fruits, vegetables, whole grains, and beans. Eating a diet low in fiber helps to reduce how often you have bowel movements and the amount of stool you produce. A low-fiber eating plan may help your digestive system heal if you:  Have certain conditions, such as Crohn's disease, diverticulitis, or irritable bowel syndrome (IBS), and are having a flare-up.  Recently had radiation therapy on your pelvis or bowel.  Recently had intestinal surgery.  Have a new surgical opening in your abdomen (colostomy or ileostomy).  Have an intestine that has narrowed (stricture). Your health care provider will tell you how long to stay on this diet and may recommend that you work with a dietitian. What are tips for following this plan? Reading food labels  Check the nutrition facts label on food products for the  amount of dietary fiber.  Choose foods that have less than 2 grams (g) of fiber per serving.   Cooking  Use white flour for baking and cooking.  Cook meat using methods that keep it tender, such as braising or poaching.  Cook eggs until the yolk is completely solid.  Cook with healthy oils, such as olive oil or canola oil. Meal planning  Eat 5-6 small meals throughout the day instead of 3 large meals.  If you are lactose intolerant: ? Choose low-lactose dairy foods. ? Do not eat dairy foods if told by your health care provider or dietitian.  Limit fats and oils to less than 8 teaspoons (39 mL) a day.  Eat small portions of desserts.  Limit acidic, spicy, or fried foods to reduce gas, bloating, and discomfort. General information  Follow instructions from your health care provider or dietitian about how much fiber you should have each day.  Most people on a low-fiber eating plan should eat less than 10 g of fiber a day. Your daily fiber goal is  _________________ g.  Take vitamin and mineral supplements as told by your health care provider or dietitian. Chewable or liquid forms are best when on this eating plan. A gummy vitamin is not recommended. What foods should I eat? Fruits Soft-cooked or canned fruits without skin and seeds. Ripe banana. Applesauce. Fruit juice without pulp. Vegetables Well-cooked or canned vegetables without skin, seeds, or stems. Cooked potatoes without skins. Vegetable juice. Grains All bread and crackers made with white flour. Waffles, pancakes, and [place] toast. Bagels. Pretzels. Melba toast, zwieback, and matzoh. Cooked and dried cereals that do not have whole grains, added fiber, seeds, or dried fruit. Domenick Gong. Hot and cold cereals made with refined corn, rice, or oats. Plain pasta and noodles. White rice. Meats and other proteins Ground meat. Tender cuts of meat or poultry. Eggs. Fish, seafood, and shellfish. Smooth nut butters. Tofu. Dairy All  milk products and drinks. Lactose-free milk, including rice, soy, and almond milk. Yogurt without fruit, nuts, chocolate, or granola mixed in. Sour cream. Cottage cheese. Cheese. Fats and oils Olive oil, canola oil, sunflower oil, flaxseed oil, avocado oil, and grapeseed oil. Mayonnaise. Cream cheese. Margarine. Butter. Beverages Decaf coffee. Fruit and vegetable juices. Smoothies (in small amounts, with no pulp or skins, and with fruits from the recommended list). Sports drinks. Herbal tea. Water. Sweets and desserts Plain cakes. Cookies. Cream pies and pies made with recommended fruits. Pudding. Custard. Fruit gelatin. Sherbet. Ice pops. Ice cream without nuts. Hard candy. Honey. Jelly. Molasses. Syrups. Chocolate. Marshmallows. Gumdrops. Seasonings and condiments Ketchup. Mild mustard. Mild salad dressings. Plain gravies. Vinegar. Spices in moderation. Salt. Sugar. Other foods Bouillon. Broth. Cream and strained soups made from recommended foods. Casseroles made with recommended foods. The items listed above may not be a complete list of foods and beverages you can eat. Contact a dietitian for more information. What foods should I avoid? Fruits Raw or dried fruit. Berries. Fruit juice with pulp. Prune juice. Vegetables Potato skins. Raw or undercooked vegetables. All beans and bean sprouts. Cooked greens. Corn. Peas. Cabbage. Beets. Broccoli. Brussels sprouts. Cauliflower. Mushrooms. Onions. Peppers. Parsnips. Okra. Sauerkraut. Grains Whole-wheat, whole-grain, or multigrain breads, cereals, or crackers. Rye bread. Cereals with nuts, raisins, or coconut. Bran. Granola. High-fiber cereals. Cornmeal or corn bread. Whole-grain pasta. Wild or brown rice. Quinoa. Popcorn. Buckwheat. Wheat germ. Meats and other proteins Tough, fibrous meats with gristle. Fatty meat. Poultry with skin. Fried meat, Sales executive, or fish. Precooked or cured meat, such as sausages or meat loaves. Berniece Salines. Hot dogs. Nuts and  chunky nut butter. Dried peas, beans, and lentils. Hummus. Dairy Yogurt with fruit, nuts, chocolate, or granola mixed in. Full-fat dairy such as whole milk, ice cream, or sour cream. Beverages Caffeinated coffee and teas. Fats and oils Avocado. Coconut. Butter. Sweets and desserts Desserts, cookies, or candies that contain nuts or coconut. Dried fruit. Jams and preserves with seeds. Marmalade. Any dessert made with fruits or grains that are not recommended. Seasonings and condiments Relish. Horseradish. Angie Fava. Olives. Other foods Corn tortilla chips. Soups made with vegetables or grains that are not recommended. The items listed above may not be a complete list of foods and beverages you should avoid. Contact a dietitian for more information. Summary  Most people on a low-fiber eating plan should eat less than 10 grams of fiber a day. Follow recommendations from your health care provider or dietitian about how much fiber you should have each day.  Always check nutrition facts labels to see the dietary fiber amount in packaged  foods. A low-fiber food will have less than 2 grams of fiber per serving.  Try to avoid whole grains, raw fruits and vegetables, dried fruit, tough cuts of meat, nuts, and seeds.  Take a vitamin and mineral supplement as told by your health care provider or dietitian. This information is not intended to replace advice given to you by your health care provider. Make sure you discuss any questions you have with your health care provider. Document Revised: 09/27/2019 Document Reviewed: 09/27/2019 Elsevier Patient Education  2021 [place].   High-Fiber Eating Plan Fiber, also called dietary fiber, is a type of carbohydrate. It is found foods such as fruits, vegetables, whole grains, and beans. A high-fiber diet can have many health benefits. Your health care provider may recommend a high-fiber diet to help:  Prevent constipation. Fiber can make your bowel  movements more regular.  Lower your cholesterol.  Relieve the following conditions: ? Inflammation of veins in the anus (hemorrhoids). ? Inflammation of specific areas of the digestive tract (uncomplicated diverticulosis). ? A problem of the large intestine, also called the colon, that sometimes causes pain and diarrhea (irritable bowel syndrome, or IBS).  Prevent overeating as part of a weight-loss plan.  Prevent heart disease, type 2 diabetes, and certain cancers. What are tips for following this plan? Reading food labels  Check the nutrition facts label on food products for the amount of dietary fiber. Choose foods that have 5 grams of fiber or more per serving.  The goals for recommended daily fiber intake include: ? Men (age 42 or younger): 34-38 g. ? Men (over age 4): 28-34 g. ? Women (age 70 or younger): 25-28 g. ? Women (over age 35): 22-25 g. Your daily fiber goal is _____________ g.   Shopping  Choose whole fruits and vegetables instead of processed forms, such as apple juice or applesauce.  Choose a wide variety of high-fiber foods such as avocados, lentils, oats, and kidney beans.  Read the nutrition facts label of the foods you choose. Be aware of foods with added fiber. These foods often have high sugar and sodium amounts per serving. Cooking  Use whole-grain flour for baking and cooking.  Cook with brown rice instead of white rice. Meal planning  Start the day with a breakfast that is high in fiber, such as a cereal that contains 5 g of fiber or more per serving.  Eat breads and cereals that are made with whole-grain flour instead of refined flour or white flour.  Eat brown rice, bulgur wheat, or millet instead of white rice.  Use beans in place of meat in soups, salads, and pasta dishes.  Be sure that half of the grains you eat each day are whole grains. General information  You can get the recommended daily intake of dietary fiber by: ? Eating a  variety of fruits, vegetables, grains, nuts, and beans. ? Taking a fiber supplement if you are not able to take in enough fiber in your diet. It is better to get fiber through food than from a supplement.  Gradually increase how much fiber you consume. If you increase your intake of dietary fiber too quickly, you may have bloating, cramping, or gas.  Drink plenty of water to help you digest fiber.  Choose high-fiber snacks, such as berries, raw vegetables, nuts, and popcorn. What foods should I eat? Fruits Berries. Pears. Apples. Oranges. Avocado. Prunes and raisins. Dried figs. Vegetables Sweet potatoes. Spinach. Kale. Artichokes. Cabbage. Broccoli. Cauliflower. Green peas. Carrots.  Squash. Grains Whole-grain breads. Multigrain cereal. Oats and oatmeal. Brown rice. Barley. Bulgur wheat. Baker. Quinoa. Bran muffins. Popcorn. Rye wafer crackers. Meats and other proteins Navy beans, kidney beans, and pinto beans. Soybeans. Split peas. Lentils. Nuts and seeds. Dairy Fiber-fortified yogurt. Beverages Fiber-fortified soy milk. Fiber-fortified orange juice. Other foods Fiber bars. The items listed above may not be a complete list of recommended foods and beverages. Contact a dietitian for more information. What foods should I avoid? Fruits Fruit juice. Cooked, strained fruit. Vegetables Fried potatoes. Canned vegetables. Well-cooked vegetables. Grains White bread. Pasta made with refined flour. White rice. Meats and other proteins Fatty cuts of meat. Fried chicken or fried fish. Dairy Milk. Yogurt. Cream cheese. Sour cream. Fats and oils Butters. Beverages Soft drinks. Other foods Cakes and pastries. The items listed above may not be a complete list of foods and beverages to avoid. Talk with your dietitian about what choices are best for you. Summary  Fiber is a type of carbohydrate. It is found in foods such as fruits, vegetables, whole grains, and beans.  A high-fiber diet  has many benefits. It can help to prevent constipation, lower blood cholesterol, aid weight loss, and reduce your risk of heart disease, diabetes, and certain cancers.  Increase your intake of fiber gradually. Increasing fiber too quickly may cause cramping, bloating, and gas. Drink plenty of water while you increase the amount of fiber you consume.  The best sources of fiber include whole fruits and vegetables, whole grains, nuts, seeds, and beans. This information is not intended to replace advice given to you by your health care provider. Make sure you discuss any questions you have with your health care provider. Document Revised: 09/27/2019 Document Reviewed: 09/27/2019 Elsevier Patient Education  [place].   Diverticulitis  Diverticulitis is when small pouches in your colon (large intestine) get infected or swollen. This causes pain in the belly (abdomen) and watery poop (diarrhea). These pouches are called diverticula. The pouches form in people who have a condition called diverticulosis. What are the causes? This condition may be caused by poop (stool) that gets trapped in the pouches in your colon. The poop lets germs (bacteria) grow in the pouches. This causes the infection. What increases the risk? You are more likely to get this condition if you have small pouches in your colon. The risk is higher if:  You are overweight or very overweight (obese).  You do not exercise enough.  You drink alcohol.  You smoke or use products with tobacco in them.  You eat a diet that has a lot of red meat such as beef, pork, or lamb.  You eat a diet that does not have enough fiber in it.  You are older than 37 years of age. What are the signs or symptoms?  Pain in the belly. Pain is often on the left side, but it may be in other areas.  Fever and feeling cold.  Feeling like you may vomit.  Vomiting.  Having cramps.  Feeling full.  Changes to how often you  poop.  Blood in your poop. How is this treated? Most cases are treated at home by:  Taking over-the-counter pain medicines.  Following a clear liquid diet.  Taking antibiotic medicines.  Resting. Very bad cases may need to be treated at a hospital. This may include:  Not eating or drinking.  Taking prescription pain medicine.  Getting antibiotic medicines through an IV tube.  Getting fluid and food through an IV  tube.  Having surgery. When you are feeling better, your doctor may tell you to have a test to check your colon (colonoscopy). Follow these instructions at home: Medicines  Take over-the-counter and prescription medicines only as told by your doctor. These include: ? Antibiotics. ? Pain medicines. ? Fiber pills. ? Probiotics. ? Stool softeners.  If you were prescribed an antibiotic medicine, take it as told by your doctor. Do not stop taking the antibiotic even if you start to feel better.  Ask your doctor if the medicine prescribed to you requires you to avoid driving or using machinery. Eating and drinking  Follow a diet as told by your doctor.  When you feel better, your doctor may tell you to change your diet. You may need to eat a lot of fiber. Fiber makes it easier to poop (have a bowel movement). Foods with fiber include: ? Berries. ? Beans. ? Lentils. ? Green vegetables.  Avoid eating red meat.   General instructions  Do not use any products that contain nicotine or tobacco, such as cigarettes, e-cigarettes, and chewing tobacco. If you need help quitting, ask your doctor.  Exercise 3 or more times a week. Try to get 30 minutes each time. Exercise enough to sweat and make your heart beat faster.  Keep all follow-up visits as told by your doctor. This is important. Contact a doctor if:  Your pain does not get better.  You are not pooping like normal. Get help right away if:  Your pain gets worse.  Your symptoms do not get better.  Your  symptoms get worse very fast.  You have a fever.  You vomit more than one time.  You have poop that is: ? Bloody. ? Black. ? Tarry. Summary  This condition happens when small pouches in your colon get infected or swollen.  Take medicines only as told by your doctor.  Follow a diet as told by your doctor.  Keep all follow-up visits as told by your doctor. This is important. This information is not intended to replace advice given to you by your health care provider. Make sure you discuss any questions you have with your health care provider. Document Revised: 03/05/2019 Document Reviewed: 03/05/2019 Elsevier Patient Education  2021 [employer].

## 2020-11-11 ENCOUNTER — Other Ambulatory Visit: Payer: Self-pay | Admitting: Physician Assistant

## 2020-11-11 ENCOUNTER — Other Ambulatory Visit (HOSPITAL_COMMUNITY): Payer: Self-pay

## 2020-11-11 ENCOUNTER — Other Ambulatory Visit: Payer: Self-pay | Admitting: General Surgery

## 2020-11-11 DIAGNOSIS — R188 Other ascites: Secondary | ICD-10-CM

## 2020-11-11 DIAGNOSIS — K5720 Diverticulitis of large intestine with perforation and abscess without bleeding: Secondary | ICD-10-CM

## 2020-11-11 MED ORDER — SODIUM CHLORIDE 0.9% FLUSH: 5.0000 mL | Freq: Three times a day (TID) | INTRAVENOUS | 1 refills | Status: DC

## 2020-11-11 MED ORDER — NORMAL SALINE FLUSH 0.9 % IV SOLN
INTRAVENOUS | 0 refills | Status: DC
  Filled 2020-11-11: qty 850, 30d supply, fill #0

## 2020-11-13 LAB — AEROBIC/ANAEROBIC CULTURE W GRAM STAIN (SURGICAL/DEEP WOUND)

## 2020-11-26 ENCOUNTER — Encounter: Payer: Self-pay | Admitting: Internal Medicine

## 2020-11-27 ENCOUNTER — Encounter: Payer: BC Managed Care – PPO | Admitting: Nurse Practitioner

## 2020-11-27 ENCOUNTER — Ambulatory Visit
Admission: RE | Admit: 2020-11-27 | Discharge: 2020-11-27 | Disposition: A | Discharge status: Home / Self Care | Payer: BC Managed Care – PPO | Source: Ambulatory Visit | Attending: General Surgery | Admitting: General Surgery

## 2020-11-27 ENCOUNTER — Encounter: Payer: Self-pay | Admitting: Radiology

## 2020-11-27 ENCOUNTER — Other Ambulatory Visit: Payer: Self-pay | Admitting: General Surgery

## 2020-11-27 ENCOUNTER — Ambulatory Visit
Admission: RE | Admit: 2020-11-27 | Discharge: 2020-11-27 | Disposition: A | Discharge status: Home / Self Care | Payer: BC Managed Care – PPO | Source: Ambulatory Visit | Attending: Radiology | Admitting: Radiology

## 2020-11-27 DIAGNOSIS — R188 Other ascites: Secondary | ICD-10-CM

## 2020-11-27 DIAGNOSIS — Z978 Presence of other specified devices: Secondary | ICD-10-CM | POA: Diagnosis not present

## 2020-11-27 DIAGNOSIS — K8020 Calculus of gallbladder without cholecystitis without obstruction: Secondary | ICD-10-CM | POA: Diagnosis not present

## 2020-11-27 DIAGNOSIS — K5780 Diverticulitis of intestine, part unspecified, with perforation and abscess without bleeding: Secondary | ICD-10-CM | POA: Diagnosis not present

## 2020-11-27 HISTORY — PX: IR RADIOLOGIST EVAL & MGMT: IMG5224

## 2020-11-27 NOTE — Progress Notes (Signed)
Referring Physician(s): Dr. Leighton Rios  Chief Complaint: The patient is seen in follow up today s/p perforated diverticulitis  History of present illness: Mikayla Rios is a 35 year old female with past medical history COVID-19 in May 2022, diverticulitis. She presented to [facility] ED from [facility] after CT Abdomen Pelvis showed perforated diverticulitis of the descending colon. She was initially managed conservatively, however follow-up CT obtained for persistent WBC showed: Continued wall thickening is seen involving descending colon suggesting infectious or inflammatory colitis or perforated diverticulitis. There is an adjacent fluid collection measuring 7.0 x 5.0 cm consistent with abscess which is significantly enlarged compared to prior exam. Adjacent to the abscess, there is a severely thickened small bowel loop most consistent with secondary inflammation.   5.2 x 3.5 cm fluid collection is noted within the left psoas muscle consistent with abscess which is significantly enlarged compared to prior exam. 4.4 x 3.8 cm fluid collection consistent with abscess is noted in the left retroperitoneal region which is also enlarged compared to prior exam.  She underwent drain placement x2 with Dr. Pascal Rios 11/07/20 who placed an biliary-type drain in the retroperitoneal abscess and a pigtail catheter in the anterior collection.    Mikayla Rios returns today for evaluation and management of her two abdominal drains.  She reports slow improvement at home.  She states her appetite is improving, but finds the low fiber diet difficult due to limited food choices.  She report slow improvement in her ability to manage her home with the drains in place.  She required assistance from her husband.   They have been flushing the drains daily and recording output which has ranged from 20-35 mL from the lateral drain, and 20-50 mL from the anterior drain.  She has completed antibiotics.  She has follow-up  planned with Mikayla Rios next week on 6/28.   Past Medical History:  Diagnosis Date   Allergy    seasonal   Blood infection ([place]) 1985   Blood transfusion without reported diagnosis    Hypertension     Past Surgical History:  Procedure Laterality Date   NO PAST SURGERIES      Allergies: Shellfish allergy and Penicillins  Medications: Prior to Admission medications   Medication Sig Start Date End Date Taking? Authorizing Provider  sodium chloride flush (NS) 0.9 % SOLN 5 mLs by Intracatheter route every 8 (eight) hours. 11/11/20   Norm Parcel, PA-C  acetaminophen (TYLENOL) 650 MG CR tablet Take 1,300 mg by mouth every 8 (eight) hours as needed for pain.    [provider]  amLODipine (NORVASC) 10 MG tablet Take 1 tablet (10 mg total) by mouth daily. 01/02/20   [name], MD  benzonatate (TESSALON PERLES) 100 MG capsule Take 1 capsule (100 mg total) by mouth every 6 (six) hours as needed. Patient taking differently: Take 100 mg by mouth every 6 (six) hours as needed for cough. 10/06/20 10/06/21  Minette Brine, FNP  lisinopril-hydrochlorothiazide (ZESTORETIC) 20-12.5 MG tablet Take 2 tablets by mouth daily. 11/23/19   [name], MD  metoprolol succinate (TOPROL-XL) 25 MG 24 hr tablet Take 1 tablet (25 mg total) by mouth 2 (two) times daily. 05/15/20   Bary Castilla, NP  metroNIDAZOLE (FLAGYL) 500 MG tablet Take 1 tablet (500 mg total) by mouth 2 (two) times daily with a meal. DO NOT CONSUME ALCOHOL WHILE TAKING THIS MEDICATION. 11/10/20   Norm Parcel, PA-C  Probiotic Product (PROBIOTIC PO) Take 1 capsule by  mouth daily.    [provider]  Sodium Chloride Flush (NORMAL SALINE FLUSH) 0.9 % SOLN Flush as directed 11/11/20   Sandi Mariscal, MD     Family History  Problem Relation Age of Onset   Cancer Mother        breast, and in remission   Diabetes Mother    Hypertension Mother    Autism Son    Heart disease Paternal Aunt    Cancer Paternal  Uncle        colon   Diabetes Maternal Grandmother    Hypertension Maternal Grandmother    Diabetes Maternal Grandfather    Kidney disease Maternal Grandfather    Diabetes Paternal Grandmother    Cancer Paternal Grandfather        colon   Congestive Heart Failure Father    Hypertension Father     Social History   Socioeconomic History   Marital status: Married    Spouse name: Not on file   Number of children: 3   Years of education: Not on file   Highest education level: Not on file  Occupational History   Not on file  Tobacco Use   Smoking status: Never   Smokeless tobacco: Never  Vaping Use   Vaping Use: Never used  Substance and Sexual Activity   Alcohol use: Not Currently   Drug use: No   Sexual activity: Yes    Birth control/protection: None  Other Topics Concern   Not on file  Social History Narrative   Not on file   Social Determinants of Health   Financial Resource Strain: Not on file  Food Insecurity: Not on file  Transportation Needs: Not on file  Physical Activity: Not on file  Stress: Not on file  Social Connections: Not on file     Vital Signs: There were no vitals taken for this visit.  Physical Exam NAD, alert Abdomen: soft, non-tender.  Anterior drain intact with insertion site c/d/I. No erythema or skin breakdown.  Output cloudy, yellow-orange, 30 mL in bag. Lateral drain intact.  Insertion site c/d/I.  No fluctuance or tenderness in the subcutaneous tissues surrounding drain.  No leaking on exam.  Output 30-40 mL thick, beige fluid in collection bag.   Imaging: No results found.  Labs:  CBC: Recent Labs    11/07/20 0402 11/08/20 0421 11/09/20 0356 11/10/20 0751  WBC 20.9* 19.2* 17.2* 12.1*  HGB 8.3* 8.0* 8.2* 8.1*  HCT 27.6* 26.6* 27.4* 27.3*  PLT 772* 710* 675* 588*    COAGS: Recent Labs    11/07/20 1820  INR 1.3*    BMP: Recent Labs    12/21/19 1024 11/04/20 1119 11/06/20 0442 11/07/20 0402 11/08/20 0421  11/09/20 0356  NA 139   < > 135 136 135 136  K 4.1   < > 3.4* 4.2 4.8 4.2  CL 99   < > 99 101 102 100  CO2 23   < > 25 27 26 26   GLUCOSE 99   < > 118* 105* 99 103*  BUN 13   < > 8 7 6 6   CALCIUM 9.6   < > 8.4* 8.6* 8.3* 8.8*  CREATININE 0.90   < > 0.74 0.74 0.71 0.47  GFRNONAA 83   < > >60 >60 >60 >60  GFRAA 96  --   --   --   --   --    < > = values in this interval not displayed.    LIVER FUNCTION  TESTS: Recent Labs    11/04/20 1119  BILITOT 0.4  AST 15  ALT 12  ALKPHOS 66  PROT 7.9  ALBUMIN 2.5*    Assessment: Perforated diverticulitis s/p percutaneous drain placement x2 by Dr. Pascal Rios 11/07/20. Mikayla Rios presents to IR drain clinic today for evaluation and management of her 2 percutaneous abscess drains. She reports modest but slow improvement at home since discharge.  She feels she is slowly improving in activity, appetite, and strength.  Her drains remain in place today.  She has been flushing daily and recording output.  Anterior drain intact today with 20-50 mL output daily.   Lateral drain intact today with 20-35 mL output daily. Output is thick, beige. CT Abdomen w/o contrast performed today due to difficulty with IV access.  Prior imaging obtained from hospitalization were all without contrast as well.  Imaging shows interval improvement of her fluid collections.   Drain injection performed and shows fistulous connection to the anterior drain.  No fistula demonstrated to the lateral drain.  Dr. Serafina Royals recommends that based on the presence of fistulous connection to anterior drain, stop flushes and leave to gravity drainage for additional 2 weeks. Based on increased, thick/beige output from the lateral drain leave to drainage, no flushes needed, for additional 2 weeks.  Patient and husband updated on care plan.   Schedulers to arrange follow-up appointment for CT Abd/Pelvis w/o contrast, drain injection in 2 weeks.    Signed: Docia Barrier, PA 11/27/2020, 2:12  PM   Please refer to Dr. Malachi Carl attestation of this note for management and plan.

## 2020-12-02 DIAGNOSIS — K5720 Diverticulitis of large intestine with perforation and abscess without bleeding: Secondary | ICD-10-CM | POA: Diagnosis not present

## 2020-12-08 ENCOUNTER — Other Ambulatory Visit: Payer: Self-pay | Admitting: Cardiology

## 2020-12-08 DIAGNOSIS — I10 Essential (primary) hypertension: Secondary | ICD-10-CM

## 2020-12-11 ENCOUNTER — Other Ambulatory Visit (HOSPITAL_COMMUNITY): Payer: Self-pay | Admitting: General Surgery

## 2020-12-11 ENCOUNTER — Ambulatory Visit
Admission: RE | Admit: 2020-12-11 | Discharge: 2020-12-11 | Disposition: A | Discharge status: Home / Self Care | Payer: BC Managed Care – PPO | Source: Ambulatory Visit | Attending: General Surgery | Admitting: General Surgery

## 2020-12-11 DIAGNOSIS — R188 Other ascites: Secondary | ICD-10-CM

## 2020-12-11 DIAGNOSIS — K8020 Calculus of gallbladder without cholecystitis without obstruction: Secondary | ICD-10-CM | POA: Diagnosis not present

## 2020-12-11 DIAGNOSIS — Z4682 Encounter for fitting and adjustment of non-vascular catheter: Secondary | ICD-10-CM | POA: Diagnosis not present

## 2020-12-11 DIAGNOSIS — R509 Fever, unspecified: Secondary | ICD-10-CM | POA: Diagnosis not present

## 2020-12-11 DIAGNOSIS — D259 Leiomyoma of uterus, unspecified: Secondary | ICD-10-CM | POA: Diagnosis not present

## 2020-12-11 DIAGNOSIS — K632 Fistula of intestine: Secondary | ICD-10-CM | POA: Diagnosis not present

## 2020-12-11 HISTORY — PX: IR RADIOLOGIST EVAL & MGMT: IMG5224

## 2020-12-11 NOTE — H&P (Signed)
Chief Complaint: Patient was seen in consultation today for image guided exchange of percutaneous drain at the request of [facility]  Referring Physician(s): Thomas,Alicia  Supervising Physician: Ruthann Cancer  Patient Status: [facility] - Out-pt  History of Present Illness: Mikayla Rios is a 35 y.o. female with PMH of perforated diverticulitis and complex pericolonic abscess s/p drain placements x2 with IR on 11/07/2020.  Patient has been followed by IR at the drain clinic, last seen on 12/11/2020 for follow-up CT scan and fistulogram.patient stated that she continues to have output from both drains.  Imaging was reviewed by Dr. Anselm Pancoast who recommended exchange of the left lateral drain from a biliary type drain to a conventional multiple was pigtail drain and repeat fistulogram to better assess for a fistula connection.  Left anterior drain to be remain in place until the patient can have a definitive bowel surgery to the descending colon per Dr. Anselm Pancoast.  Patient presents to [facility] IR for left lateral drain exchange and repeat fistulogram.  Patient laying in bed, not in acute distress.  Reports mild abdominal pain due to gas.  Denise headache, fever, chills, shortness of breath, cough, chest pain, vomiting, and bleeding.   Past Medical History:  Diagnosis Date   Allergy    seasonal   Blood infection ([place]) 1985   Blood transfusion without reported diagnosis    Hypertension     Past Surgical History:  Procedure Laterality Date   IR RADIOLOGIST EVAL & MGMT  11/27/2020   IR RADIOLOGIST EVAL & MGMT  12/11/2020   NO PAST SURGERIES      Allergies: Shellfish allergy and Penicillins  Medications: Prior to Admission medications   Medication Sig Start Date End Date Taking? Authorizing Provider  sodium chloride flush (NS) 0.9 % SOLN 5 mLs by Intracatheter route every 8 (eight) hours. 11/11/20   Norm Parcel, PA-C  acetaminophen (TYLENOL) 650 MG CR tablet Take 1,300 mg by mouth every 8  (eight) hours as needed for pain.    [provider]  amLODipine (NORVASC) 10 MG tablet Take 1 tablet (10 mg total) by mouth daily. 01/02/20   [name], MD  benzonatate (TESSALON PERLES) 100 MG capsule Take 1 capsule (100 mg total) by mouth every 6 (six) hours as needed. Patient taking differently: Take 100 mg by mouth every 6 (six) hours as needed for cough. 10/06/20 10/06/21  Minette Brine, FNP  lisinopril-hydrochlorothiazide (ZESTORETIC) 20-12.5 MG tablet TAKE 2 TABLETS BY MOUTH EVERY DAY 12/10/20   [name], MD  metoprolol succinate (TOPROL-XL) 25 MG 24 hr tablet Take 1 tablet (25 mg total) by mouth 2 (two) times daily. 05/15/20   Bary Castilla, NP  metroNIDAZOLE (FLAGYL) 500 MG tablet Take 1 tablet (500 mg total) by mouth 2 (two) times daily with a meal. DO NOT CONSUME ALCOHOL WHILE TAKING THIS MEDICATION. 11/10/20   Norm Parcel, PA-C  Probiotic Product (PROBIOTIC PO) Take 1 capsule by mouth daily.    [provider]  Sodium Chloride Flush (NORMAL SALINE FLUSH) 0.9 % SOLN Flush as directed 11/11/20   Sandi Mariscal, MD     Family History  Problem Relation Age of Onset   Cancer Mother        breast, and in remission   Diabetes Mother    Hypertension Mother    Autism Son    Heart disease Paternal Aunt    Cancer Paternal Uncle        colon   Diabetes Maternal Grandmother  Hypertension Maternal Grandmother    Diabetes Maternal Grandfather    Kidney disease Maternal Grandfather    Diabetes Paternal Grandmother    Cancer Paternal Grandfather        colon   Congestive Heart Failure Father    Hypertension Father     Social History   Socioeconomic History   Marital status: Married    Spouse name: Not on file   Number of children: 3   Years of education: Not on file   Highest education level: Not on file  Occupational History   Not on file  Tobacco Use   Smoking status: Never   Smokeless tobacco: Never  Vaping Use   Vaping Use:  Never used  Substance and Sexual Activity   Alcohol use: Not Currently   Drug use: No   Sexual activity: Yes    Birth control/protection: None  Other Topics Concern   Not on file  Social History Narrative   Not on file   Social Determinants of Health   Financial Resource Strain: Not on file  Food Insecurity: Not on file  Transportation Needs: Not on file  Physical Activity: Not on file  Stress: Not on file  Social Connections: Not on file     Review of Systems: A 12 point ROS discussed and pertinent positives are indicated in the HPI above.  All other systems are negative.   Vital Signs: BP 101/62   Pulse 89   Temp 97.8 F (36.6 C) (Oral)   Resp 20   Ht 5\' 3"  (1.6 m)   Wt 250 lb (113.4 kg)   SpO2 93%   BMI 44.29 kg/m   Physical Exam  Vitals and nursing note reviewed.  Constitutional:      General: He is not in acute distress.    Appearance: Normal appearance.  HENT:     Head: Normocephalic and atraumatic.     Mouth/Throat:     Mouth: Mucous membranes are moist.     Pharynx: Oropharynx is clear.  Cardiovascular:     Rate and Rhythm: Normal rate and regular rhythm.     Pulses: Normal pulses.     Heart sounds: Normal heart sounds.  Pulmonary:     Effort: Pulmonary effort is normal.     Breath sounds: Normal breath sounds. No wheezing, rhonchi or rales.  Abdominal:     General: Bowel sounds are normal. There is no distension.     Palpations: Abdomen is soft.  Skin:    General: Skin is warm and dry.  Neurological:     Mental Status: He is alert and oriented to person, place, and time.  Psychiatric:        Mood and Affect: Mood normal.        Behavior: Behavior normal.    MD Evaluation Airway: WNL Heart: WNL Abdomen: WNL Chest/ Lungs: WNL ASA  Classification: 2 Mallampati/Airway Score: Two  Imaging: CT ABDOMEN PELVIS WO CONTRAST  Result Date: 12/11/2020 CLINICAL DATA:  History of diverticular abscess and status post percutaneous drains.  Intermittent fevers. Persistent output from percutaneous drains. EXAM: CT ABDOMEN AND PELVIS WITHOUT CONTRAST TECHNIQUE: Multidetector CT imaging of the abdomen and pelvis was performed following the standard protocol without IV contrast. COMPARISON:  CT 11/27/2020 FINDINGS: Lower chest: Persistent linear densities at the lung bases could be related to scarring or atelectasis. No pleural effusions. Hepatobiliary: Normal appearance of the liver. Evidence for multiple gallstones. No evidence for gallbladder distention or inflammatory changes. Pancreas: Unremarkable. No pancreatic ductal  dilatation or surrounding inflammatory changes. Spleen: Normal in size without focal abnormality. Adrenals/Urinary Tract: Normal right adrenal gland. Stable low-density left adrenal nodule that most likely represents an adenoma based on the Hounsfield units from the previous exam on 11/04/2020. Hounsfield units are higher on this examination and this is thought to represent a technical issue. Normal appearance of the right kidney. Cyst in left kidney lower pole. Negative for hydronephrosis. Urinary bladder is decompressed. Stomach/Bowel: Again noted are inflammatory changes in the left lower abdomen. Limited evaluation of this area without intravascular contrast. Persistent pericolonic inflammatory changes around the distal descending colon. Minimal change in the pericolonic edema and stranding. Small pocket of gas and stranding just medial to the colon on sequence 2 image 54. The anterior percutaneous drain is very close to loops of small bowel in the left anterior lower abdomen and concern that the drain is going through part of the small bowel. No evidence for bowel obstruction. Small gas-filled structure in the medial right abdomen is likely related to the appendix and similar to the previous examination. Normal appearance of the stomach. Vascular/Lymphatic: No significant vascular findings are present. No enlarged abdominal or  pelvic lymph nodes. Reproductive: Large uterine fibroid along the anterior aspect of the uterus with small calcifications. This fibroid measures up to 7.6 cm in the craniocaudal dimension. New low-density structure along the uterine fundus could be related to adnexal cyst versus adjacent loops of small bowel. Normal appearance of left ovary/left adnexa. Other: Left lateral abdominal drain is in a stable position along the lateral aspect of the left psoas muscle. Radiopaque marker of the lateral drain is within the lateral abdominal musculature and there is mild subcutaneous stranding near the drain. The pericolonic inflammatory changes around the distal descending colon have not significantly changed. There is probably a small residual collection medial to the colon containing gas but this is poorly defined without intravenous contrast. No new abscess collections. Negative for free fluid in the pelvis. Musculoskeletal: No acute bone abnormality. IMPRESSION: 1. Stable inflammatory changes centered around the distal descending colon in the left lower abdomen. Pericolonic inflammatory changes have minimally changed since 11/27/2020. Small pocket of gas medial to the colon is probably associated with a fistula or small residual abscess collection. 2. Stable position of the two percutaneous drains. The more anterior drain may be extending through a portion of the small bowel. 3. Cholelithiasis. 4. Fibroid uterus. Cannot exclude an ovarian/adnexal cystic structure near the uterine fundus. 5. Left adrenal adenoma. Electronically Signed   By: Markus Daft M.D.   On: 12/11/2020 16:32   CT ABDOMEN PELVIS WO CONTRAST  Result Date: 11/27/2020 CLINICAL DATA:  35 year old female with history of diverticular abscesses status post retroperitoneal and intra-abdominal fluid collection drain placements on 11/07/2020. EXAM: CT ABDOMEN AND PELVIS WITHOUT CONTRAST TECHNIQUE: Multidetector CT imaging of the abdomen and pelvis was  performed following the standard protocol without IV contrast. COMPARISON:  11/07/2020 FINDINGS: Lower chest: No acute abnormality. Hepatobiliary: Liver is normal in size, contour, and attenuation. Gallbladder is present and mildly distended with multiple calcified gallstones. No intra or extrahepatic biliary ductal dilation. Pancreas: Unremarkable. No pancreatic ductal dilatation or surrounding inflammatory changes. Spleen: Normal in size without focal abnormality. Adrenals/Urinary Tract: Unchanged 2.4 cm left adrenal adenoma. Similar appearing left inferior pole exophytic simple renal cyst measuring up to approximately 2.5 cm. No hydronephrosis. No nephrolithiasis. The bladder is decompressed. Stomach/Bowel: Stomach is within normal limits. Appendix appears normal. There is fat stranding and likely at least mild  mural thickening about the descending colon, decreased from comparison. Vascular/Lymphatic: No significant vascular findings are present. No enlarged abdominal or pelvic lymph nodes. Reproductive: The uterus appears mildly enlarged with fundal partially calcified fibroid measuring up to 4.8 cm. No adnexal masses. Other: The previously visualized fluid collection in the left mid abdomen, just anterior to the descending colon is significantly decreased in size with no measurable residual fluid collection component. There is prominent surrounding fat stranding with a few foci of gas. The previously visualized left psoas/retroperitoneal fluid collection is significantly decreased in size without residual measurable component. There are 2 foci of gas near the indwelling pigtail portion of the drain and mild surrounding fat stranding which is confluent with the more anterior fluid collection. Musculoskeletal: No acute or significant osseous findings. IMPRESSION: 1. Significantly interval decreased size of the previously visualized left retroperitoneal and left anterior mid abdominal fluid collections. There is  persistent fat stranding and mural thickening of the mid descending colon at this level. 2. Unchanged leiomyomatous uterus. 3. Unchanged cholelithiasis. Ruthann Cancer, MD [facility] Radiology Electronically Signed   By: Ruthann Cancer MD   On: 11/27/2020 15:51   DG Sinus/Fist Tube Chk-Non GI  Result Date: 12/11/2020 INDICATION: 35 year old with history of complex diverticular abscesses and percutaneous drains. Persistent output from both drains. EXAM: Drain injection with fluoroscopy x 2 MEDICATIONS: None ANESTHESIA/SEDATION: None CONTRAST:  14 mL Omnipaque 001 COMPLICATIONS: None immediate. FLUOROSCOPY TIME:  30 seconds, 7 mGy PROCEDURE: Patient was placed supine on the interventional table. Scout image was obtained. Dressings removed from both drains. The more anterior drain was initially injected with contrast. This drain was flushed with normal saline and attached to the gravity bag. The lateral drain was injected with contrast. Drain was flushed with saline and attached to the gravity bag. New dressings were placed. FINDINGS: Injection of the anterior drain demonstrates immediate filling of small bowel. There is no significant abscess collection around this drain. These findings are concerning that the drain is going through a portion of small bowel. The lateral drain is a biliary type drain. Contrast was preferentially filling the proximal aspect of the drain near the lateral abdominal wall. Some contrast was draining around the tube towards the skin surface. Minimal filling along the distal aspect of the drain. No significant abscess collection around the lateral drain. IMPRESSION: 1. 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 Suspect the drain is going through a portion of the small bowel. No significant abscess around this drainage catheter. 2. Limited evaluation of the lateral drain because contrast was preferentially going out the  more proximal side holes and not filling the distal aspect of the drain. No large abscess collection or fistula identified from the lateral drain. Electronically Signed   By: Markus Daft M.D.   On: 12/11/2020 16:39   DG Sinus/Fist Tube Chk-Non GI  Result Date: 11/27/2020 CLINICAL DATA:  35 year old female with history of diverticular abscesses status post 2 drain placements on 11/07/2020. The patient reports persistent significant output of the each drain. EXAM: ABSCESS INJECTION COMPARISON:  CT abdomen pelvis from earlier same day, 11/07/2020 CONTRAST:  20 mL Omnipaque 300-administered via the existing percutaneous drain. FLUOROSCOPY TIME:  1 minutes, 18 seconds, 23 mGy TECHNIQUE: The patient was positioned supine on the fluoroscopy table. A preprocedural spot fluoroscopic image was obtained of the left lower quadrant and the existing percutaneous drainage catheters. Multiple spot fluoroscopic and radiographic images were obtained following the injection of a  small amount of contrast via the existing percutaneous drainage catheters. FINDINGS: Indeterminate, however small fluid collection surrounding the retroperitoneal/biliary drainage catheter. No definite evidence of fistulization. Direct fistulization with small bowel about the more superior, anterior left lower quadrant drainage catheter. IMPRESSION: Given persistent output via each drain and direct small bowel fistulization from the more superior, anterior indwelling drainage catheter, both drains were left in place. PLAN: Return in 2-3 weeks for repeat drain injection. Ruthann Cancer, MD [facility] Radiology Electronically Signed   By: Ruthann Cancer MD   On: 11/27/2020 16:00   IR Radiologist Eval & Mgmt  Result Date: 12/11/2020 Please refer to notes tab for details about interventional procedure. (Op Note)  IR Radiologist Eval & Mgmt  Result Date: 11/27/2020 Please refer to notes tab for details about  interventional procedure. (Op Note)   Labs:  CBC: Recent Labs    11/07/20 0402 11/08/20 0421 11/09/20 0356 11/10/20 0751  WBC 20.9* 19.2* 17.2* 12.1*  HGB 8.3* 8.0* 8.2* 8.1*  HCT 27.6* 26.6* 27.4* 27.3*  PLT 772* 710* 675* 588*    COAGS: Recent Labs    11/07/20 1820  INR 1.3*    BMP: Recent Labs    12/21/19 1024 11/04/20 1119 11/06/20 0442 11/07/20 0402 11/08/20 0421 11/09/20 0356  NA 139   < > 135 136 135 136  K 4.1   < > 3.4* 4.2 4.8 4.2  CL 99   < > 99 101 102 100  CO2 23   < > 25 27 26 26   GLUCOSE 99   < > 118* 105* 99 103*  BUN 13   < > 8 7 6 6   CALCIUM 9.6   < > 8.4* 8.6* 8.3* 8.8*  CREATININE 0.90   < > 0.74 0.74 0.71 0.47  GFRNONAA 83   < > >60 >60 >60 >60  GFRAA 96  --   --   --   --   --    < > = values in this interval not displayed.    LIVER FUNCTION TESTS: Recent Labs    11/04/20 1119  BILITOT 0.4  AST 15  ALT 12  ALKPHOS 66  PROT 7.9  ALBUMIN 2.5*    TUMOR MARKERS: No results for input(s): AFPTM, CEA, CA199, CHROMGRNA in the last 8760 hours.  Assessment and Plan: 35 y.o. female with PMH of perforated diverticulitis and complex pericolonic abscess s/p drain placements x2 with IR on 11/07/2020.  Patient has been followed by IR at the drain clinic, last seen on 12/11/2020 for follow-up CT scan and fistulogram. Imaging was reviewed by Dr. Anselm Pancoast who recommended exchange of the left lateral drain from a biliary type drain to a conventional multiple was pigtail drain and repeat fistulogram to better assess for a fistula connection.  Patient presents to [facility] IR today for a left lateral drain catheter exchange and a repeat fistulogram. N.p.o. since midnight VSS Not on anticoagulant/antiplatelet treatment.  Risks and benefits discussed with the patient including bleeding, infection, damage to adjacent structures, bowel perforation/fistula connection, and sepsis.  All of the patient's questions were answered, patient is agreeable to  proceed. Consent signed and in chart.   Thank you for this interesting consult.  I greatly enjoyed meeting Mikayla Rios and look forward to participating in their care.  A copy of this report was sent to the requesting provider on this date.  Electronically Signed: Tera Mater, PA-C 12/12/2020, 3:01 PM   I spent a  total of    25 Minutes in face to face in clinical consultation, greater than 50% of which was counseling/coordinating care for exchange of left lateral drain from a biliary type drain to a multipurpose pigtail and repeat fistulogram.

## 2020-12-11 NOTE — Progress Notes (Signed)
Chief Complaint: Patient was seen in consultation today for drain management  Referring Physician(s): Thomas,Alicia  History of Present Illness: Mikayla Rios is a 35 y.o. female with history of perforated diverticulitis and complex pericolonic abscesses.  Patient underwent placement of 2 percutaneous drains on 11/07/2020.  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  Patient reports that she is slowly getting her energy back.  She is still having intermittent fevers.  She went on a low fiber diet in order to help with diarrhea.  She is now having mild constipation.  No significant abdominal pain.  She is not currently flushing the drains.  Patient recently saw Dr. Marcello Moores in general surgery.  Past Medical History:  Diagnosis Date   Allergy    seasonal   Blood infection ([place]) 1985   Blood transfusion without reported diagnosis    Hypertension     Past Surgical History:  Procedure Laterality Date   IR RADIOLOGIST EVAL & MGMT  11/27/2020   IR RADIOLOGIST EVAL & MGMT  12/11/2020   NO PAST SURGERIES      Allergies: Shellfish allergy and Penicillins  Medications: Prior to Admission medications   Medication Sig Start Date End Date Taking? Authorizing Provider  sodium chloride flush (NS) 0.9 % SOLN 5 mLs by Intracatheter route every 8 (eight) hours. 11/11/20   Norm Parcel, PA-C  acetaminophen (TYLENOL) 650 MG CR tablet Take 1,300 mg by mouth every 8 (eight) hours as needed for pain.    [provider]  amLODipine (NORVASC) 10 MG tablet Take 1 tablet (10 mg total) by mouth daily. 01/02/20   [name], MD  benzonatate (TESSALON PERLES) 100 MG capsule Take 1 capsule (100 mg total) by mouth every 6 (six) hours as needed. Patient taking differently: Take 100 mg by mouth every 6 (six) hours as needed for cough. 10/06/20 10/06/21  Minette Brine, FNP  lisinopril-hydrochlorothiazide (ZESTORETIC) 20-12.5 MG tablet TAKE 2 TABLETS BY MOUTH EVERY DAY 12/10/20   [name], MD  metoprolol succinate (TOPROL-XL) 25 MG 24 hr tablet Take 1 tablet (25 mg total) by mouth 2 (two) times daily. 05/15/20   Bary Castilla, NP  metroNIDAZOLE (FLAGYL) 500 MG tablet Take 1 tablet (500 mg total) by mouth 2 (two) times daily with a meal. DO NOT CONSUME ALCOHOL WHILE TAKING THIS MEDICATION. 11/10/20   Norm Parcel, PA-C  Probiotic Product (PROBIOTIC PO) Take 1 capsule by mouth daily.    [provider]  Sodium Chloride Flush (NORMAL SALINE FLUSH) 0.9 % SOLN Flush as directed 11/11/20   Sandi Mariscal, MD     Family History  Problem Relation Age of Onset   Cancer Mother        breast, and in remission   Diabetes Mother    Hypertension Mother    Autism Son    Heart disease Paternal Aunt    Cancer Paternal Uncle        colon   Diabetes Maternal Grandmother    Hypertension Maternal Grandmother    Diabetes Maternal Grandfather    Kidney disease Maternal Grandfather    Diabetes Paternal Grandmother    Cancer Paternal Grandfather        colon   Congestive Heart Failure Father    Hypertension Father     Social History   Socioeconomic History   Marital status: Married  Spouse name: Not on file   Number of children: 3   Years of education: Not on file   Highest education level: Not on file  Occupational History   Not on file  Tobacco Use   Smoking status: Never   Smokeless tobacco: Never  Vaping Use   Vaping Use: Never used  Substance and Sexual Activity   Alcohol use: Not Currently   Drug use: No   Sexual activity: Yes    Birth control/protection: None  Other Topics Concern   Not on file  Social History Narrative   Not on file   Social Determinants of Health   Financial Resource Strain: Not on file  Food Insecurity: Not on file  Transportation Needs: Not on file  Physical Activity: Not on file   Stress: Not on file  Social Connections: Not on file     Review of Systems  Constitutional:  Positive for fatigue and fever.  Gastrointestinal:  Positive for constipation. Negative for abdominal pain.   Vital Signs: BP (!) 114/57   Pulse (!) 133   Temp 98.7 F (37.1 C)   SpO2 99%   Physical Exam Constitutional:      Appearance: She is obese.  Abdominal:     Palpations: Abdomen is soft.     Comments: Left lower quadrant anterior drain is intact.  Orange-colored material within the drain.  There is approximately 100 mL of fluid in the drain and this represents approximately 2 days of output.  Lateral left lower quadrant drain is intact.  There is approximately 50 mL of opaque gray fluid within this drain.  This represents approximately 2 days of output.  Neurological:     Mental Status: She is alert.       Imaging: CT ABDOMEN PELVIS WO CONTRAST  Result Date: 12/11/2020 CLINICAL DATA:  History of diverticular abscess and status post percutaneous drains. Intermittent fevers. Persistent output from percutaneous drains. EXAM: CT ABDOMEN AND PELVIS WITHOUT CONTRAST TECHNIQUE: Multidetector CT imaging of the abdomen and pelvis was performed following the standard protocol without IV contrast. COMPARISON:  CT 11/27/2020 FINDINGS: Lower chest: Persistent linear densities at the lung bases could be related to scarring or atelectasis. No pleural effusions. Hepatobiliary: Normal appearance of the liver. Evidence for multiple gallstones. No evidence for gallbladder distention or inflammatory changes. Pancreas: Unremarkable. No pancreatic ductal dilatation or surrounding inflammatory changes. Spleen: Normal in size without focal abnormality. Adrenals/Urinary Tract: Normal right adrenal gland. Stable low-density left adrenal nodule that most likely represents an adenoma based on the Hounsfield units from the previous exam on 11/04/2020. Hounsfield units are higher on this examination and this is  thought to represent a technical issue. Normal appearance of the right kidney. Cyst in left kidney lower pole. Negative for hydronephrosis. Urinary bladder is decompressed. Stomach/Bowel: Again noted are inflammatory changes in the left lower abdomen. Limited evaluation of this area without intravascular contrast. Persistent pericolonic inflammatory changes around the distal descending colon. Minimal change in the pericolonic edema and stranding. Small pocket of gas and stranding just medial to the colon on sequence 2 image 54. The anterior percutaneous drain is very close to loops of small bowel in the left anterior lower abdomen and concern that the drain is going through part of the small bowel. No evidence for bowel obstruction. Small gas-filled structure in the medial right abdomen is likely related to the appendix and similar to the previous examination. Normal appearance of the stomach. Vascular/Lymphatic: No significant vascular findings are present. No enlarged  abdominal or pelvic lymph nodes. Reproductive: Large uterine fibroid along the anterior aspect of the uterus with small calcifications. This fibroid measures up to 7.6 cm in the craniocaudal dimension. New low-density structure along the uterine fundus could be related to adnexal cyst versus adjacent loops of small bowel. Normal appearance of left ovary/left adnexa. Other: Left lateral abdominal drain is in a stable position along the lateral aspect of the left psoas muscle. Radiopaque marker of the lateral drain is within the lateral abdominal musculature and there is mild subcutaneous stranding near the drain. The pericolonic inflammatory changes around the distal descending colon have not significantly changed. There is probably a small residual collection medial to the colon containing gas but this is poorly defined without intravenous contrast. No new abscess collections. Negative for free fluid in the pelvis. Musculoskeletal: No acute bone  abnormality. IMPRESSION: 1. Stable inflammatory changes centered around the distal descending colon in the left lower abdomen. Pericolonic inflammatory changes have minimally changed since 11/27/2020. Small pocket of gas medial to the colon is probably associated with a fistula or small residual abscess collection. 2. Stable position of the two percutaneous drains. The more anterior drain may be extending through a portion of the small bowel. 3. Cholelithiasis. 4. Fibroid uterus. Cannot exclude an ovarian/adnexal cystic structure near the uterine fundus. 5. Left adrenal adenoma. Electronically Signed   By: Markus Daft M.D.   On: 12/11/2020 16:32   CT ABDOMEN PELVIS WO CONTRAST  Result Date: 11/27/2020 CLINICAL DATA:  35 year old female with history of diverticular abscesses status post retroperitoneal and intra-abdominal fluid collection drain placements on 11/07/2020. EXAM: CT ABDOMEN AND PELVIS WITHOUT CONTRAST TECHNIQUE: Multidetector CT imaging of the abdomen and pelvis was performed following the standard protocol without IV contrast. COMPARISON:  11/07/2020 FINDINGS: Lower chest: No acute abnormality. Hepatobiliary: Liver is normal in size, contour, and attenuation. Gallbladder is present and mildly distended with multiple calcified gallstones. No intra or extrahepatic biliary ductal dilation. Pancreas: Unremarkable. No pancreatic ductal dilatation or surrounding inflammatory changes. Spleen: Normal in size without focal abnormality. Adrenals/Urinary Tract: Unchanged 2.4 cm left adrenal adenoma. Similar appearing left inferior pole exophytic simple renal cyst measuring up to approximately 2.5 cm. No hydronephrosis. No nephrolithiasis. The bladder is decompressed. Stomach/Bowel: Stomach is within normal limits. Appendix appears normal. There is fat stranding and likely at least mild mural thickening about the descending colon, decreased from comparison. Vascular/Lymphatic: No significant vascular findings  are present. No enlarged abdominal or pelvic lymph nodes. Reproductive: The uterus appears mildly enlarged with fundal partially calcified fibroid measuring up to 4.8 cm. No adnexal masses. Other: The previously visualized fluid collection in the left mid abdomen, just anterior to the descending colon is significantly decreased in size with no measurable residual fluid collection component. There is prominent surrounding fat stranding with a few foci of gas. The previously visualized left psoas/retroperitoneal fluid collection is significantly decreased in size without residual measurable component. There are 2 foci of gas near the indwelling pigtail portion of the drain and mild surrounding fat stranding which is confluent with the more anterior fluid collection. Musculoskeletal: No acute or significant osseous findings. IMPRESSION: 1. Significantly interval decreased size of the previously visualized left retroperitoneal and left anterior mid abdominal fluid collections. There is persistent fat stranding and mural thickening of the mid descending colon at this level. 2. Unchanged leiomyomatous uterus. 3. Unchanged cholelithiasis. Ruthann Cancer, MD [facility] Rad[facility] Electronically Signed   By: Ruthann Cancer MD  On: 11/27/2020 15:51   DG Sinus/Fist Tube Chk-Non GI  Result Date: 12/11/2020 INDICATION: 35 year old with history of complex diverticular abscesses and percutaneous drains. Persistent output from both drains. EXAM: Drain injection with fluoroscopy x 2 MEDICATIONS: None ANESTHESIA/SEDATION: None CONTRAST:  14 mL Omnipaque 182 COMPLICATIONS: None immediate. FLUOROSCOPY TIME:  30 seconds, 7 mGy PROCEDURE: Patient was placed supine on the interventional table. Scout image was obtained. Dressings removed from both drains. The more anterior drain was initially injected with contrast. This drain was flushed with normal saline and attached to the gravity bag.  The lateral drain was injected with contrast. Drain was flushed with saline and attached to the gravity bag. New dressings were placed. FINDINGS: Injection of the anterior drain demonstrates immediate filling of small bowel. There is no significant abscess collection around this drain. These findings are concerning that the drain is going through a portion of small bowel. The lateral drain is a biliary type drain. Contrast was preferentially filling the proximal aspect of the drain near the lateral abdominal wall. Some contrast was draining around the tube towards the skin surface. Minimal filling along the distal aspect of the drain. No significant abscess collection around the lateral drain. IMPRESSION: 1. 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 Suspect the drain is going through a portion of the small bowel. No significant abscess around this drainage catheter. 2. Limited evaluation of the lateral drain because contrast was preferentially going out the more proximal side holes and not filling the distal aspect of the drain. No large abscess collection or fistula identified from the lateral drain. Electronically Signed   By: Markus Daft M.D.   On: 12/11/2020 16:39   DG Sinus/Fist Tube Chk-Non GI  Result Date: 11/27/2020 CLINICAL DATA:  35 year old female with history of diverticular abscesses status post 2 drain placements on 11/07/2020. The patient reports persistent significant output of the each drain. EXAM: ABSCESS INJECTION COMPARISON:  CT abdomen pelvis from earlier same day, 11/07/2020 CONTRAST:  20 mL Omnipaque 300-administered via the existing percutaneous drain. FLUOROSCOPY TIME:  1 minutes, 18 seconds, 23 mGy TECHNIQUE: The patient was positioned supine on the fluoroscopy table. A preprocedural spot fluoroscopic image was obtained of the left lower quadrant and the existing percutaneous drainage catheters. Multiple spot fluoroscopic and radiographic images were obtained  following the injection of a small amount of contrast via the existing percutaneous drainage catheters. FINDINGS: Indeterminate, however small fluid collection surrounding the retroperitoneal/biliary drainage catheter. No definite evidence of fistulization. Direct fistulization with small bowel about the more superior, anterior left lower quadrant drainage catheter. IMPRESSION: Given persistent output via each drain and direct small bowel fistulization from the more superior, anterior indwelling drainage catheter, both drains were left in place. PLAN: Return in 2-3 weeks for repeat drain injection. Ruthann Cancer, MD [facility] Electronically Signed   By: Ruthann Cancer MD   On: 11/27/2020 16:00   IR Radiologist Eval & Mgmt  Result Date: 12/11/2020 Please refer to notes tab for details about interventional procedure. (Op Note)  IR Radiologist Eval & Mgmt  Result Date: 11/27/2020 Please refer to notes tab for details about interventional procedure. (Op Note)   Labs:  CBC: Recent Labs    11/07/20 0402 11/08/20 0421 11/09/20 0356 11/10/20 0751  WBC 20.9* 19.2* 17.2* 12.1*  HGB 8.3* 8.0* 8.2* 8.1*  HCT 27.6* 26.6* 27.4* 27.3*  PLT 772* 710* 675* 588*    COAGS: Recent Labs    11/07/20  1820  INR 1.3*    BMP: Recent Labs    12/21/19 1024 11/04/20 1119 11/06/20 0442 11/07/20 0402 11/08/20 0421 11/09/20 0356  NA 139   < > 135 136 135 136  K 4.1   < > 3.4* 4.2 4.8 4.2  CL 99   < > 99 101 102 100  CO2 23   < > 25 27 26 26   GLUCOSE 99   < > 118* 105* 99 103*  BUN 13   < > 8 7 6 6   CALCIUM 9.6   < > 8.4* 8.6* 8.3* 8.8*  CREATININE 0.90   < > 0.74 0.74 0.71 0.47  GFRNONAA 83   < > >60 >60 >60 >60  GFRAA 96  --   --   --   --   --    < > = values in this interval not displayed.    LIVER FUNCTION TESTS: Recent Labs    11/04/20 1119  BILITOT 0.4  AST 15  ALT 12  ALKPHOS 66  PROT 7.9  ALBUMIN 2.5*    TUMOR  MARKERS: No results for input(s): AFPTM, CEA, CA199, CHROMGRNA in the last 8760 hours.  Assessment and Plan:  35 year old with history of perforated diverticulitis.  The large abscess collections have essentially resolved but there is persistent and stable pericolonic stranding and inflammation.  Patient continues to have output from both drains.  The anterior tube is draining small bowel material and this was confirmed with the drain injection.  There was immediate filling of the small bowel with the drain injection and based on the CT findings, I am concerned that the drain could be extending through a portion of small bowel.  The lateral drain continues to put out gray opaque fluid but this drain is difficult evaluate because it's a biliary type drain and we are not filling the entire pigtail portion of the drain.  Plan for exchange of the left lateral drain for a conventional multipurpose pigtail drain.  At that time, we can better assess for any fistula connection in this area.  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  May need to leave this drain in place until the patient can have a definitive bowel surgery to the descending colon.  Alternatively, we could try to downsize his drain to an 8.5 [place] multipurpose drain but there may be a risk of losing percutaneous access.  Plan to discuss with Dr. Marcello Moores in general surgery.    Electronically Signed: Burman Riis 12/11/2020, 4:57 PM   I spent a total of    15 Minutes in face to face in clinical consultation, greater than 50% of which was counseling/coordinating care for drain management.  Patient ID: TAHIRY SPICER, female   DOB: 01-10-1986, 35 y.o.   MRN: [id number]

## 2020-12-12 ENCOUNTER — Encounter (HOSPITAL_COMMUNITY): Payer: Self-pay

## 2020-12-12 ENCOUNTER — Other Ambulatory Visit: Payer: Self-pay

## 2020-12-12 ENCOUNTER — Ambulatory Visit (HOSPITAL_COMMUNITY)
Admission: RE | Admit: 2020-12-12 | Discharge: 2020-12-12 | Disposition: A | Discharge status: Home / Self Care | Payer: BC Managed Care – PPO | Source: Ambulatory Visit | Attending: General Surgery | Admitting: General Surgery

## 2020-12-12 DIAGNOSIS — Z4803 Encounter for change or removal of drains: Secondary | ICD-10-CM | POA: Insufficient documentation

## 2020-12-12 DIAGNOSIS — R188 Other ascites: Secondary | ICD-10-CM

## 2020-12-12 DIAGNOSIS — K632 Fistula of intestine: Secondary | ICD-10-CM | POA: Diagnosis not present

## 2020-12-12 DIAGNOSIS — Z4682 Encounter for fitting and adjustment of non-vascular catheter: Secondary | ICD-10-CM | POA: Diagnosis not present

## 2020-12-12 HISTORY — PX: IR CATHETER TUBE CHANGE: IMG717

## 2020-12-12 HISTORY — PX: IR SINUS/FIST TUBE CHK-NON GI: IMG673

## 2020-12-12 LAB — PREGNANCY, URINE: Preg Test, Ur: NEGATIVE

## 2020-12-12 MED ORDER — MIDAZOLAM HCL 2 MG/2ML IJ SOLN
INTRAMUSCULAR | Status: AC | PRN
  Administered 2020-12-12: 1 mg via INTRAVENOUS

## 2020-12-12 MED ORDER — FENTANYL CITRATE (PF) 100 MCG/2ML IJ SOLN
INTRAMUSCULAR | Status: AC | PRN
  Administered 2020-12-12 (×2): 25 ug via INTRAVENOUS

## 2020-12-12 MED ORDER — SODIUM CHLORIDE 0.9% FLUSH: 5.0000 mL | Freq: Three times a day (TID) | INTRAVENOUS | Status: DC

## 2020-12-12 MED ORDER — IOHEXOL 300 MG/ML  SOLN
50.0000 mL | Freq: Once | INTRAMUSCULAR | Status: AC | PRN
  Administered 2020-12-12: 20 mL

## 2020-12-12 MED ORDER — MIDAZOLAM HCL 2 MG/2ML IJ SOLN
INTRAMUSCULAR | Status: AC
  Filled 2020-12-12: qty 2

## 2020-12-12 MED ORDER — FENTANYL CITRATE (PF) 100 MCG/2ML IJ SOLN
INTRAMUSCULAR | Status: AC
  Filled 2020-12-12: qty 2

## 2020-12-12 MED ORDER — LIDOCAINE HCL 1 % IJ SOLN
INTRAMUSCULAR | Status: AC
  Administered 2020-12-12: 5 mL via SUBCUTANEOUS
  Filled 2020-12-12: qty 20

## 2020-12-12 NOTE — Procedures (Signed)
Interventional Radiology Procedure Note  Procedure:  1) Drain check x2 2) Exchange of posterior abdominal drain  Findings: Please refer to procedural dictation for full description. 12 Fr biliary drain in left posterior abdomen exchanged for 10 Fr multipurpose drain.  Purulent drainage aspirated.  Repeat drain injection after exchange demonstrates vague, irregular fluid collection in this region without definite bowel fistulization.  Injection of anterior drain demonstrates brisk fistulization to bowel.  I suspect some of the sideholes are within the small bowel.  This drain was left in place.  Complications: None immediate  Estimated Blood Loss: < 5 mL  Recommendations: Keep drains to bag drainage.  Continue to monitor output.  Follow up in IR drain clinic in 3-4 weeks.   Ruthann Cancer, MD Pager: [phone]

## 2020-12-15 ENCOUNTER — Other Ambulatory Visit (HOSPITAL_COMMUNITY): Payer: Self-pay | Admitting: General Surgery

## 2020-12-15 ENCOUNTER — Encounter (HOSPITAL_COMMUNITY): Payer: Self-pay

## 2020-12-15 DIAGNOSIS — R188 Other ascites: Secondary | ICD-10-CM

## 2020-12-17 ENCOUNTER — Other Ambulatory Visit (HOSPITAL_COMMUNITY): Payer: Self-pay | Admitting: Interventional Radiology

## 2020-12-17 ENCOUNTER — Other Ambulatory Visit (HOSPITAL_COMMUNITY): Payer: Self-pay | Admitting: General Surgery

## 2020-12-17 ENCOUNTER — Other Ambulatory Visit: Payer: Self-pay | Admitting: General Surgery

## 2020-12-17 DIAGNOSIS — K5720 Diverticulitis of large intestine with perforation and abscess without bleeding: Secondary | ICD-10-CM

## 2020-12-19 ENCOUNTER — Ambulatory Visit (HOSPITAL_COMMUNITY): Admission: RE | Admit: 2020-12-19 | Payer: BC Managed Care – PPO | Source: Ambulatory Visit

## 2020-12-29 DIAGNOSIS — K5720 Diverticulitis of large intestine with perforation and abscess without bleeding: Secondary | ICD-10-CM | POA: Diagnosis not present

## 2020-12-31 ENCOUNTER — Encounter: Payer: Self-pay | Admitting: Internal Medicine

## 2021-01-01 ENCOUNTER — Encounter: Payer: BC Managed Care – PPO | Admitting: Nurse Practitioner

## 2021-01-07 ENCOUNTER — Ambulatory Visit
Admission: RE | Admit: 2021-01-07 | Discharge: 2021-01-07 | Disposition: A | Discharge status: Home / Self Care | Payer: BC Managed Care – PPO | Source: Ambulatory Visit | Attending: General Surgery | Admitting: General Surgery

## 2021-01-07 ENCOUNTER — Other Ambulatory Visit: Payer: Self-pay

## 2021-01-07 ENCOUNTER — Encounter: Payer: Self-pay | Admitting: *Deleted

## 2021-01-07 DIAGNOSIS — Z4682 Encounter for fitting and adjustment of non-vascular catheter: Secondary | ICD-10-CM | POA: Diagnosis not present

## 2021-01-07 DIAGNOSIS — K5720 Diverticulitis of large intestine with perforation and abscess without bleeding: Secondary | ICD-10-CM

## 2021-01-07 DIAGNOSIS — K5780 Diverticulitis of intestine, part unspecified, with perforation and abscess without bleeding: Secondary | ICD-10-CM | POA: Diagnosis not present

## 2021-01-07 DIAGNOSIS — N281 Cyst of kidney, acquired: Secondary | ICD-10-CM | POA: Diagnosis not present

## 2021-01-07 DIAGNOSIS — K632 Fistula of intestine: Secondary | ICD-10-CM | POA: Diagnosis not present

## 2021-01-07 DIAGNOSIS — K6819 Other retroperitoneal abscess: Secondary | ICD-10-CM | POA: Diagnosis not present

## 2021-01-07 DIAGNOSIS — K6389 Other specified diseases of intestine: Secondary | ICD-10-CM | POA: Diagnosis not present

## 2021-01-07 HISTORY — PX: IR RADIOLOGIST EVAL & MGMT: IMG5224

## 2021-01-07 MED ORDER — IOPAMIDOL (ISOVUE-300) INJECTION 61%
100.0000 mL | Freq: Once | INTRAVENOUS | Status: AC | PRN
  Administered 2021-01-07: 100 mL via INTRAVENOUS

## 2021-01-07 NOTE — Progress Notes (Signed)
Referring Physician(s): Thomas,Alicia  Chief Complaint: The patient is seen in follow up today s/p 11/07/20 1. Successful CT guided placement of a 12 French multi sidehole (biliary type) drain catheter into the adjacent retroperitoneal abscesses via left lateral approach with aspiration of 150 mL of purulent fluid. 2. Successful CT-guided placement of a 10 French drainage catheter into interloop abscess via left ventral lateral approach yielding 75 cc of purulent fluid.  History of present illness:  Diverticular abscess Follows with Dr Joyice Faster Drains placed 11/07/20 I IR  OP of both drains  11/27/20:  +fistula anterior drain Drains remained in place  7/7: IMPRESSION: 1. 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 Suspect the drain is going through a portion of the small bowel. No significant abscess around this drainage catheter. 2. Limited evaluation of the lateral drain because contrast was preferentially going out the more proximal side holes and not filling the distal aspect of the drain. No large abscess collection or fistula identified from the lateral drain.   Posterior lateral Drain exchange 12/12/20- to bag OP from drains have changed since exchange Anterior drain has little to none since week after visit Lateral drain has had increased OP since exchange But now tender and leaking at site - brown thick fluid  Scheduled today for re CT and evaluation of drains  Denies fever chills Pain at lateral drain for few weeks OP from anterior drain has stopped- no flushes OP from lateral drain is 50-60 daily- no flushes Both to bags     Past Medical History:  Diagnosis Date   Allergy    seasonal   Blood infection ([place]) 1985   Blood transfusion without reported diagnosis    Hypertension     Past Surgical History:  Procedure Laterality Date   IR CATHETER TUBE CHANGE  12/12/2020   IR RADIOLOGIST EVAL & MGMT  11/27/2020   IR RADIOLOGIST EVAL &  MGMT  12/11/2020   IR SINUS/FIST TUBE CHK-NON GI  12/12/2020   NO PAST SURGERIES      Allergies: Shellfish allergy and Penicillins  Medications: Prior to Admission medications   Medication Sig Start Date End Date Taking? Authorizing Provider  sodium chloride flush (NS) 0.9 % SOLN 5 mLs by Intracatheter route every 8 (eight) hours. 11/11/20   Norm Parcel, PA-C  acetaminophen (TYLENOL) 650 MG CR tablet Take 1,300 mg by mouth every 8 (eight) hours as needed for pain.    [provider]  amLODipine (NORVASC) 10 MG tablet Take 1 tablet (10 mg total) by mouth daily. 01/02/20   [name], MD  benzonatate (TESSALON PERLES) 100 MG capsule Take 1 capsule (100 mg total) by mouth every 6 (six) hours as needed. Patient taking differently: Take 100 mg by mouth every 6 (six) hours as needed for cough. 10/06/20 10/06/21  Minette Brine, FNP  lisinopril-hydrochlorothiazide (ZESTORETIC) 20-12.5 MG tablet TAKE 2 TABLETS BY MOUTH EVERY DAY 12/10/20   [name], MD  metoprolol succinate (TOPROL-XL) 25 MG 24 hr tablet Take 1 tablet (25 mg total) by mouth 2 (two) times daily. 05/15/20   Bary Castilla, NP  metroNIDAZOLE (FLAGYL) 500 MG tablet Take 1 tablet (500 mg total) by mouth 2 (two) times daily with a meal. DO NOT CONSUME ALCOHOL WHILE TAKING THIS MEDICATION. 11/10/20   Norm Parcel, PA-C  Probiotic Product (PROBIOTIC PO) Take 1 capsule by mouth daily.    [provider]  Sodium Chloride Flush (NORMAL SALINE FLUSH) 0.9 %  SOLN Flush as directed 11/11/20   Sandi Mariscal, MD     Family History  Problem Relation Age of Onset   Cancer Mother        breast, and in remission   Diabetes Mother    Hypertension Mother    Autism Son    Heart disease Paternal Aunt    Cancer Paternal Uncle        colon   Diabetes Maternal Grandmother    Hypertension Maternal Grandmother    Diabetes Maternal Grandfather    Kidney disease Maternal Grandfather    Diabetes Paternal Grandmother     Cancer Paternal Grandfather        colon   Congestive Heart Failure Father    Hypertension Father     Social History   Socioeconomic History   Marital status: Married    Spouse name: Not on file   Number of children: 3   Years of education: Not on file   Highest education level: Not on file  Occupational History   Not on file  Tobacco Use   Smoking status: Never   Smokeless tobacco: Never  Vaping Use   Vaping Use: Never used  Substance and Sexual Activity   Alcohol use: Not Currently   Drug use: No   Sexual activity: Yes    Birth control/protection: None  Other Topics Concern   Not on file  Social History Narrative   Not on file   Social Determinants of Health   Financial Resource Strain: Not on file  Food Insecurity: Not on file  Transportation Needs: Not on file  Physical Activity: Not on file  Stress: Not on file  Social Connections: Not on file     Vital Signs: There were no vitals taken for this visit.  Physical Exam Skin:    General: Skin is warm.     Comments: Site of anterior drain is clean and dry NT no bleeding No sign of infection Unable to flush or inject contrast--- small drain connector is clogged Removed drain connector and was able to perform drain  injection + fistula to bowel remains New stat lock and dressing placed   Site of lateral drain is messy Brown thick output is around drain site Sutures are not intact - although drain does not seem to have migrated much at all. Cleaned skin Very tender at site No infection is noted Drain injection was performed No fistula noted In good position despite suture not intact New stat lock and dressing placed  CT revealing no real change from previous 12/11/20    Imaging: No results found.  Labs:  CBC: Recent Labs    11/07/20 0402 11/08/20 0421 11/09/20 0356 11/10/20 0751  WBC 20.9* 19.2* 17.2* 12.1*  HGB 8.3* 8.0* 8.2* 8.1*  HCT 27.6* 26.6* 27.4* 27.3*  PLT 772* 710* 675*  588*    COAGS: Recent Labs    11/07/20 1820  INR 1.3*    BMP: Recent Labs    11/06/20 0442 11/07/20 0402 11/08/20 0421 11/09/20 0356  NA 135 136 135 136  K 3.4* 4.2 4.8 4.2  CL 99 101 102 100  CO2 '25 27 26 26  '$ GLUCOSE 118* 105* 99 103*  BUN '8 7 6 6  '$ CALCIUM 8.4* 8.6* 8.3* 8.8*  CREATININE 0.74 0.74 0.71 0.47  GFRNONAA >60 >60 >60 >60    LIVER FUNCTION TESTS: Recent Labs    11/04/20 1119  BILITOT 0.4  AST 15  ALT 12  ALKPHOS 66  PROT  7.9  ALBUMIN 2.5*    Assessment:  Diverticular abscess Drains x 2 placed 11/07/20 Drain injection 6/23 revealing + fistula to bowel  - anterior drain No fistula at lateral drain 7/7 revisit--- drain injection again revealing + fistula for anterior drain No fistula for lateral drain but malpositioned Drain exchange 12/12/20 Today has come for CT and re eval CT showing no real changes from 7/722 Drains in good position- per Dr Mir Drains to remain Return for CT and drain injection 4 weeks Change stat lock lateral drain 2 weeks She has goos understanding of this plan   Signed: Lavonia Drafts, PA-C 01/07/2021, 2:27 PM   Please refer to Dr. Dwaine Gale attestation of this note for management and plan.

## 2021-01-08 ENCOUNTER — Other Ambulatory Visit: Payer: Self-pay | Admitting: General Surgery

## 2021-01-08 DIAGNOSIS — K5720 Diverticulitis of large intestine with perforation and abscess without bleeding: Secondary | ICD-10-CM

## 2021-01-09 ENCOUNTER — Telehealth: Payer: Self-pay | Admitting: Student

## 2021-01-09 NOTE — Telephone Encounter (Addendum)
Mikayla Rios is s/p diverticular abscess drain placement x 2 with IR on 11/07/20.  Patient has been followed at IR drain clinic, last seen on 01/07/21. Imaging showed persistent fistulous communication with small bowel from the more anterior drain, and no significant residual cavity or fistula related to the more posterior/lateral drain.    Plan was to bring the patient back in 4 weeks for follow up CT and drain injection at the clinic; however, received a message from Dr. Dwaine Gale that he would like the patient to be scheduled for drain exchange at [facility] sooner than 4 weeks.  Dr. Dwaine Gale contacted [facility] IR scheduler.   Patient was called and informed that she will receive a call from IR schedulers to schedule the drain exchange, and asked patient to reach out to [facility] drain clinic if she does not receive a call by the end of next week.  Patient verbalized understanding.  Please call IR for further questions and concerns.   Armando Gang Brad Lieurance PA-C 01/09/2021 11:36 AM

## 2021-01-09 NOTE — Progress Notes (Signed)
I discussed the case with Dr. Marcello Moores, in particular the anterior abdominal drain which is terminating within the small bowel.  We decided that the best course of action at this time is to downsize this drain with goals of eventually removing it.    [place], MD [phone]

## 2021-01-13 ENCOUNTER — Telehealth: Payer: Self-pay | Admitting: Internal Medicine

## 2021-01-13 ENCOUNTER — Other Ambulatory Visit: Payer: Self-pay | Admitting: Internal Medicine

## 2021-01-13 ENCOUNTER — Encounter: Payer: Self-pay | Admitting: Internal Medicine

## 2021-01-13 DIAGNOSIS — Q799 Congenital malformation of musculoskeletal system, unspecified: Secondary | ICD-10-CM

## 2021-01-13 DIAGNOSIS — R937 Abnormal findings on diagnostic imaging of other parts of musculoskeletal system: Secondary | ICD-10-CM

## 2021-01-13 NOTE — Telephone Encounter (Signed)
Late entry: spoke with Radiology last week regarding bony lesion seen on CT a/p - needs f/u MRI L-S w/ and w/o contrast for further evaluation. Called pt and LMOAM to call me back regarding recent test results.

## 2021-01-16 ENCOUNTER — Other Ambulatory Visit: Payer: Self-pay | Admitting: Cardiology

## 2021-01-20 ENCOUNTER — Telehealth (HOSPITAL_COMMUNITY): Payer: Self-pay

## 2021-01-20 ENCOUNTER — Other Ambulatory Visit: Payer: Self-pay | Admitting: General Surgery

## 2021-01-20 DIAGNOSIS — K5720 Diverticulitis of large intestine with perforation and abscess without bleeding: Secondary | ICD-10-CM

## 2021-01-20 NOTE — Telephone Encounter (Signed)
Called to schedule drain downsize, no answer, left vm. AW

## 2021-01-21 ENCOUNTER — Telehealth (HOSPITAL_COMMUNITY): Payer: Self-pay

## 2021-01-21 NOTE — Telephone Encounter (Signed)
Called to move appt down, no answer, left vm. AW

## 2021-01-26 ENCOUNTER — Other Ambulatory Visit: Payer: Self-pay | Admitting: Radiology

## 2021-01-27 ENCOUNTER — Encounter (HOSPITAL_COMMUNITY): Payer: Self-pay

## 2021-01-27 ENCOUNTER — Other Ambulatory Visit: Payer: Self-pay

## 2021-01-27 ENCOUNTER — Ambulatory Visit (HOSPITAL_COMMUNITY)
Admission: RE | Admit: 2021-01-27 | Discharge: 2021-01-27 | Disposition: A | Discharge status: Home / Self Care | Payer: BC Managed Care – PPO | Source: Ambulatory Visit | Attending: General Surgery | Admitting: General Surgery

## 2021-01-27 DIAGNOSIS — Z4803 Encounter for change or removal of drains: Secondary | ICD-10-CM | POA: Diagnosis not present

## 2021-01-27 DIAGNOSIS — Z4682 Encounter for fitting and adjustment of non-vascular catheter: Secondary | ICD-10-CM | POA: Diagnosis not present

## 2021-01-27 DIAGNOSIS — Z91013 Allergy to seafood: Secondary | ICD-10-CM | POA: Insufficient documentation

## 2021-01-27 DIAGNOSIS — K5720 Diverticulitis of large intestine with perforation and abscess without bleeding: Secondary | ICD-10-CM | POA: Insufficient documentation

## 2021-01-27 DIAGNOSIS — Z79899 Other long term (current) drug therapy: Secondary | ICD-10-CM | POA: Diagnosis not present

## 2021-01-27 DIAGNOSIS — Z880 Allergy status to penicillin: Secondary | ICD-10-CM | POA: Diagnosis not present

## 2021-01-27 HISTORY — PX: IR CATHETER TUBE CHANGE: IMG717

## 2021-01-27 MED ORDER — FENTANYL CITRATE (PF) 100 MCG/2ML IJ SOLN
INTRAMUSCULAR | Status: AC
  Filled 2021-01-27: qty 2

## 2021-01-27 MED ORDER — FENTANYL CITRATE (PF) 100 MCG/2ML IJ SOLN
INTRAMUSCULAR | Status: AC | PRN
  Administered 2021-01-27 (×2): 25 ug via INTRAVENOUS

## 2021-01-27 MED ORDER — MIDAZOLAM HCL 2 MG/2ML IJ SOLN
INTRAMUSCULAR | Status: AC
  Filled 2021-01-27: qty 2

## 2021-01-27 MED ORDER — IOHEXOL 240 MG/ML SOLN
25.0000 mL | Freq: Once | INTRAMUSCULAR | Status: AC | PRN
  Administered 2021-01-27: 10 mL

## 2021-01-27 MED ORDER — SODIUM CHLORIDE 0.9 % IV SOLN: INTRAVENOUS | Status: DC

## 2021-01-27 MED ORDER — LIDOCAINE HCL 1 % IJ SOLN
INTRAMUSCULAR | Status: AC
  Filled 2021-01-27: qty 20

## 2021-01-27 MED ORDER — MIDAZOLAM HCL 2 MG/2ML IJ SOLN
INTRAMUSCULAR | Status: AC | PRN
  Administered 2021-01-27: 1 mg via INTRAVENOUS

## 2021-01-27 NOTE — H&P (Signed)
Chief Complaint: Patient was seen in consultation today for anterior abdominal drain downsize/exchange at the request of [place]  Referring Physician(s): Thomas,Alicia  Supervising Physician: Jacqulynn Cadet  Patient Status: [facility] - Out-pt  History of Present Illness: Mikayla Rios is a 35 y.o. female   Diverticular abscess drain x 2 placed in IR 11/07/20 Drain injections 01/07/21:  +fistula at anterior drain remains  No fistula at more lateral/posterior drain-- but residual cavity seen on injection  OP from anterior drain is dark brown- thin fluid OP from lateral drain is purulent; feculent  Dr Mir spoke to Dr Joyice Faster 01/07/21: I discussed the case with Dr. Marcello Moores, in particular the anterior abdominal drain which is terminating within the small bowel.  We decided that the best course of action at this time is to downsize this drain with goals of eventually removing it.     Pt is here today to have this drain downsized- exchanged.   Past Medical History:  Diagnosis Date   Allergy    seasonal   Blood infection ([place]) 1985   Blood transfusion without reported diagnosis    Hypertension     Past Surgical History:  Procedure Laterality Date   IR CATHETER TUBE CHANGE  12/12/2020   IR RADIOLOGIST EVAL & MGMT  11/27/2020   IR RADIOLOGIST EVAL & MGMT  12/11/2020   IR RADIOLOGIST EVAL & MGMT  01/07/2021   IR SINUS/FIST TUBE CHK-NON GI  12/12/2020   NO PAST SURGERIES      Allergies: Shellfish allergy and Penicillins  Medications: Prior to Admission medications   Medication Sig Start Date End Date Taking? Authorizing Provider  acetaminophen (TYLENOL) 500 MG tablet Take 1,000 mg by mouth every 6 (six) hours as needed for moderate pain or headache.   Yes [provider]  amLODipine (NORVASC) 10 MG tablet TAKE 1 TABLET BY MOUTH EVERY DAY Patient taking differently: Take 10 mg by mouth every evening. 01/19/21  Yes [name], MD   lisinopril-hydrochlorothiazide (ZESTORETIC) 20-12.5 MG tablet TAKE 2 TABLETS BY MOUTH EVERY DAY 12/10/20  Yes [name], MD  metoprolol succinate (TOPROL-XL) 25 MG 24 hr tablet Take 1 tablet (25 mg total) by mouth 2 (two) times daily. 05/15/20  Yes Ghumman, Ramandeep, NP  Simethicone (GAS-X PO) Take 1 tablet by mouth daily as needed (gas).   Yes [provider]  sodium chloride flush (NS) 0.9 % SOLN 5 mLs by Intracatheter route every 8 (eight) hours. 11/11/20   Norm Parcel, PA-C  traMADol (ULTRAM) 50 MG tablet Take 50-100 mg by mouth every 6 (six) hours as needed for pain. 12/29/20  Yes [provider]  benzonatate (TESSALON PERLES) 100 MG capsule Take 1 capsule (100 mg total) by mouth every 6 (six) hours as needed. Patient not taking: No sig reported 10/06/20 10/06/21  Minette Brine, FNP  metroNIDAZOLE (FLAGYL) 500 MG tablet Take 1 tablet (500 mg total) by mouth 2 (two) times daily with a meal. DO NOT CONSUME ALCOHOL WHILE TAKING THIS MEDICATION. Patient not taking: No sig reported 11/10/20   Norm Parcel, PA-C  Sodium Chloride Flush (NORMAL SALINE FLUSH) 0.9 % SOLN Flush as directed 11/11/20   Sandi Mariscal, MD     Family History  Problem Relation Age of Onset   Cancer Mother        breast, and in remission   Diabetes Mother    Hypertension Mother    Autism Son    Heart disease Paternal Aunt  Cancer Paternal Uncle        colon   Diabetes Maternal Grandmother    Hypertension Maternal Grandmother    Diabetes Maternal Grandfather    Kidney disease Maternal Grandfather    Diabetes Paternal Grandmother    Cancer Paternal Grandfather        colon   Congestive Heart Failure Father    Hypertension Father     Social History   Socioeconomic History   Marital status: Married    Spouse name: Not on file   Number of children: 3   Years of education: Not on file   Highest education level: Not on file  Occupational History   Not on file  Tobacco Use    Smoking status: Never   Smokeless tobacco: Never  Vaping Use   Vaping Use: Never used  Substance and Sexual Activity   Alcohol use: Not Currently   Drug use: No   Sexual activity: Yes    Birth control/protection: None  Other Topics Concern   Not on file  Social History Narrative   Not on file   Social Determinants of Health   Financial Resource Strain: Not on file  Food Insecurity: Not on file  Transportation Needs: Not on file  Physical Activity: Not on file  Stress: Not on file  Social Connections: Not on file    Review of Systems: A 12 point ROS discussed and pertinent positives are indicated in the HPI above.  All other systems are negative.    Vital Signs: BP 116/76   Pulse (!) 130   Temp 98.8 F (37.1 C) (Oral)   Ht '5\' 3"'$  (1.6 m)   Wt 250 lb (113.4 kg)   SpO2 99%   BMI 44.29 kg/m   Physical Exam Vitals reviewed.  HENT:     Mouth/Throat:     Mouth: Mucous membranes are moist.  Cardiovascular:     Rate and Rhythm: Regular rhythm. Tachycardia present.  Pulmonary:     Effort: Pulmonary effort is normal.     Breath sounds: Normal breath sounds.  Abdominal:     Palpations: Abdomen is soft.  Musculoskeletal:        General: Normal range of motion.  Skin:    General: Skin is warm.     Comments: Site of anterior drain is clean and dry NT Skin is clean OP is dark brown thin fluid--- almost like bile  Site of lateral drain is messy Some feculence has come from drain onto skin Drain is intact OP of lateral drain is milky brown; thick fluid  Neurological:     Mental Status: She is alert and oriented to person, place, and time.  Psychiatric:        Behavior: Behavior normal.    Imaging: CT ABDOMEN PELVIS W CONTRAST  Result Date: 01/09/2021 CLINICAL DATA:  Follow-up diverticular abscess drains EXAM: CT ABDOMEN AND PELVIS WITH CONTRAST TECHNIQUE: Multidetector CT imaging of the abdomen and pelvis was performed using the standard protocol following bolus  administration of intravenous contrast. CONTRAST:  14m ISOVUE-300 IOPAMIDOL (ISOVUE-300) INJECTION 61% COMPARISON:  06/13/2020 FINDINGS: Lower chest: No acute abnormality. Hepatobiliary: Cholelithiasis. Liver and bile ducts otherwise unremarkable. Pancreas: Unremarkable. No pancreatic ductal dilatation or surrounding inflammatory changes. Spleen: Normal in size without focal abnormality. 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 The internal density of this lesion on prior noncontrast CT from 11/07/2020 is consistent with an adenoma. Adrenal glands are otherwise normal in appearance. 3 cm simple  cyst present in the lower pole of the left kidney. Kidneys, ureters, and bladder otherwise unremarkable. Stomach/Bowel: No bowel dilatation to indicate ileus or obstruction. Appendix is normal. Left anterior abdominal drain is again seen. The pigtail of the drain appears to be terminating within the small bowel. Left retroperitoneal drain is not significantly changed in position. No discrete abscess is identified. Similar soft tissue thickening seen in the left retroperitoneum, pericolonic space. No new abscess identified. Vascular/Lymphatic: No significant vascular findings are present. No enlarged abdominal or pelvic lymph nodes. Reproductive: Calcified uterine masses most likely a fibroid. Previously seen right ovarian cystic structure has decreased in size since prior exam. This is likely a resolving dominant follicle/hemorrhagic cyst. Other: No abdominal wall hernia or abnormality. No abdominopelvic ascites. Musculoskeletal: Nonspecific multifocal heterogeneity of the bone marrow. 2.6 cm mildly sclerotic lesion noted in the L2 vertebral body. IMPRESSION: 1. No new abscesses identified. Similar degree of soft tissue thickening seen in the left pericolonic region. The degree of persistent soft tissues thickening in the pericolonic space further raises suspicions for malignancy. Further  evaluation with colonoscopy should be performed. 2. Left retroperitoneal abscess drain unchanged in position. Anterior left abdominal drain again seen terminating within small bowel loop. 3. 2.6 cm mildly sclerotic lesion noted in the L2 vertebral body. Further evaluation with contrast enhanced lumbar spine MRI should be performed. Recommendations regarding lumbar spine MRI were discussed with Dr. Baird Cancer by Dr. Dwaine Gale on 01/09/2021. Electronically Signed   By: Miachel Roux M.D.   On: 01/09/2021 11:34   DG Sinus/Fist Tube Chk-Non GI  Result Date: 01/08/2021 INDICATION: 35 year old woman with perforated diverticulitis and pericolonic abscesses status post 2 drain placements on 11/07/2020 and exchange of the more posterior drain on 12/12/2020 returned to interventional radiology clinic for abscessogram. EXAM: Fluoroscopic abscessogram (x2) MEDICATIONS: None ANESTHESIA/SEDATION: None COMPLICATIONS: None immediate. PROCEDURE: Informed written consent was obtained from the patient after a thorough discussion of the procedural risks, benefits and alternatives. All questions were addressed. Patient positioned supine on the procedure table. Injection through the more anterior and superior drain again showed fistulous communication with small bowel. Injection through the more inferior retroperitoneal drain showed minimal residual cavity. No fistulous communication was seen. Both drains were flushed and reattached to bulb suction. IMPRESSION: 1. Drain 1: Located more superior and anterior, again shows fistulous communication with small bowel. Drain left in place. 2. Drain 2: Located more inferior and posterior, shows minimal residual cavity with no fistulous communication. Given 40 mL daily output from this drain, it was left in place. Electronically Signed   By: Miachel Roux M.D.   On: 01/08/2021 13:14   IR Radiologist Eval & Mgmt  Result Date: 01/07/2021 Please refer to notes tab for details about interventional procedure.  (Op Note)   Labs:  CBC: Recent Labs    11/07/20 0402 11/08/20 0421 11/09/20 0356 11/10/20 0751  WBC 20.9* 19.2* 17.2* 12.1*  HGB 8.3* 8.0* 8.2* 8.1*  HCT 27.6* 26.6* 27.4* 27.3*  PLT 772* 710* 675* 588*    COAGS: Recent Labs    11/07/20 1820  INR 1.3*    BMP: Recent Labs    11/06/20 0442 11/07/20 0402 11/08/20 0421 11/09/20 0356  NA 135 136 135 136  K 3.4* 4.2 4.8 4.2  CL 99 101 102 100  CO2 '25 27 26 26  '$ GLUCOSE 118* 105* 99 103*  BUN '8 7 6 6  '$ CALCIUM 8.4* 8.6* 8.3* 8.8*  CREATININE 0.74 0.74 0.71 0.47  GFRNONAA >60 >60 >60 >  60    LIVER FUNCTION TESTS: Recent Labs    11/04/20 1119  BILITOT 0.4  AST 15  ALT 12  ALKPHOS 66  PROT 7.9  ALBUMIN 2.5*    TUMOR MARKERS: No results for input(s): AFPTM, CEA, CA199, CHROMGRNA in the last 8760 hours.  Assessment and Plan:  Diverticular abscess 2 drains placed in IR 11/07/20 Still intact Anterior drain - for exchange/downsize today Lateral drain - evaluation/new dressing today Pt is aware of procedure benefits and risks including buit not limited to Infection; damage to drain/surrounding structures Agreeable to proceed Consent signed and in chart  Thank you for this interesting consult.  I greatly enjoyed meeting Mikayla Rios and look forward to participating in their care.  A copy of this report was sent to the requesting provider on this date.  Electronically Signed: Lavonia Drafts, PA-C 01/27/2021, 9:50 AM   I spent a total of    25 Minutes in face to face in clinical consultation, greater than 50% of which was counseling/coordinating care for diverticular abscess drains

## 2021-01-31 ENCOUNTER — Other Ambulatory Visit: Payer: BC Managed Care – PPO

## 2021-02-03 ENCOUNTER — Encounter: Payer: BC Managed Care – PPO | Admitting: Nurse Practitioner

## 2021-02-03 ENCOUNTER — Other Ambulatory Visit: Payer: BC Managed Care – PPO

## 2021-02-04 ENCOUNTER — Ambulatory Visit
Admission: RE | Admit: 2021-02-04 | Discharge: 2021-02-04 | Disposition: A | Discharge status: Home / Self Care | Payer: BC Managed Care – PPO | Source: Ambulatory Visit | Attending: General Surgery | Admitting: General Surgery

## 2021-02-04 DIAGNOSIS — K5720 Diverticulitis of large intestine with perforation and abscess without bleeding: Secondary | ICD-10-CM

## 2021-02-04 DIAGNOSIS — Z4682 Encounter for fitting and adjustment of non-vascular catheter: Secondary | ICD-10-CM | POA: Diagnosis not present

## 2021-02-04 DIAGNOSIS — K8020 Calculus of gallbladder without cholecystitis without obstruction: Secondary | ICD-10-CM | POA: Diagnosis not present

## 2021-02-04 DIAGNOSIS — K5780 Diverticulitis of intestine, part unspecified, with perforation and abscess without bleeding: Secondary | ICD-10-CM | POA: Diagnosis not present

## 2021-02-04 HISTORY — PX: IR RADIOLOGIST EVAL & MGMT: IMG5224

## 2021-02-04 NOTE — Progress Notes (Addendum)
Referring Physician(s): Thomas,Alicia  Diverticular abscesses- drains placed x 2 11/07/20  Chief Complaint: The patient is seen in follow up today s/p 11/07/20 1. Successful CT guided placement of a 12 French multi sidehole (biliary type) drain catheter into the adjacent retroperitoneal abscesses via left lateral approach with aspiration of 150 mL of purulent fluid. 2. Successful CT-guided placement of a 10 French drainage catheter into interloop abscess via left ventral lateral approach yielding 75 cc of purulent fluid.   History of present illness:  Pt was seen 8/03 for drain injections and evaluation of drains IMPRESSION: 1. Drain 1: Located more superior and anterior, again shows fistulous communication with small bowel. Drain left in place. 2. Drain 2: Located more inferior and posterior, shows minimal residual cavity with no fistulous communication. Given 40 mL daily output from this drain, it was left in place.  Dr Mir then spoke to Dr Marcello Moores--- regarding drain 1--- anterior drain. Was determined best to downsize drain. This was performed 8/23 in IR Now 8 Fr tube in place. Plan is to downsize again 2-4 weeks per Dr Laurence Ferrari This will be scheduled and called to pt asap.  Today she is here to have CT and drain injection again of lateral/posterior drain.  OP is still significant for both drains Op is yellow brown and odorous. No flushes Both to gravity bags  She sees Dr Joyice Faster 03/02/21  Past Medical History:  Diagnosis Date   Allergy    seasonal   Blood infection ([place]) 1985   Blood transfusion without reported diagnosis    Hypertension     Past Surgical History:  Procedure Laterality Date   IR CATHETER TUBE CHANGE  12/12/2020   IR CATHETER TUBE CHANGE  01/27/2021   IR RADIOLOGIST EVAL & MGMT  11/27/2020   IR RADIOLOGIST EVAL & MGMT  12/11/2020   IR RADIOLOGIST EVAL & MGMT  01/07/2021   IR RADIOLOGIST EVAL & MGMT  02/04/2021   IR SINUS/FIST TUBE CHK-NON GI   12/12/2020   NO PAST SURGERIES      Allergies: Shellfish allergy and Penicillins  Medications: Prior to Admission medications   Medication Sig Start Date End Date Taking? Authorizing Provider  sodium chloride flush (NS) 0.9 % SOLN 5 mLs by Intracatheter route every 8 (eight) hours. 11/11/20   Norm Parcel, PA-C  acetaminophen (TYLENOL) 500 MG tablet Take 1,000 mg by mouth every 6 (six) hours as needed for moderate pain or headache.    [provider]  amLODipine (NORVASC) 10 MG tablet TAKE 1 TABLET BY MOUTH EVERY DAY Patient taking differently: Take 10 mg by mouth every evening. 01/19/21   [name], MD  benzonatate (TESSALON PERLES) 100 MG capsule Take 1 capsule (100 mg total) by mouth every 6 (six) hours as needed. Patient not taking: No sig reported 10/06/20 10/06/21  Minette Brine, FNP  lisinopril-hydrochlorothiazide (ZESTORETIC) 20-12.5 MG tablet TAKE 2 TABLETS BY MOUTH EVERY DAY 12/10/20   [name], MD  metoprolol succinate (TOPROL-XL) 25 MG 24 hr tablet Take 1 tablet (25 mg total) by mouth 2 (two) times daily. 05/15/20   Ghumman, Ramandeep, NP  metroNIDAZOLE (FLAGYL) 500 MG tablet Take 1 tablet (500 mg total) by mouth 2 (two) times daily with a meal. DO NOT CONSUME ALCOHOL WHILE TAKING THIS MEDICATION. Patient not taking: No sig reported 11/10/20   Norm Parcel, PA-C  Simethicone (GAS-X PO) Take 1 tablet by mouth daily as needed (gas).    [provider]  Sodium Chloride Flush (NORMAL SALINE FLUSH) 0.9 % SOLN Flush as directed 11/11/20   Sandi Mariscal, MD  traMADol (ULTRAM) 50 MG tablet Take 50-100 mg by mouth every 6 (six) hours as needed for pain. 12/29/20   [provider]     Family History  Problem Relation Age of Onset   Cancer Mother        breast, and in remission   Diabetes Mother    Hypertension Mother    Autism Son    Heart disease Paternal Aunt    Cancer Paternal Uncle        colon   Diabetes Maternal Grandmother     Hypertension Maternal Grandmother    Diabetes Maternal Grandfather    Kidney disease Maternal Grandfather    Diabetes Paternal Grandmother    Cancer Paternal Grandfather        colon   Congestive Heart Failure Father    Hypertension Father     Social History   Socioeconomic History   Marital status: Married    Spouse name: Not on file   Number of children: 3   Years of education: Not on file   Highest education level: Not on file  Occupational History   Not on file  Tobacco Use   Smoking status: Never   Smokeless tobacco: Never  Vaping Use   Vaping Use: Never used  Substance and Sexual Activity   Alcohol use: Not Currently   Drug use: No   Sexual activity: Yes    Birth control/protection: None  Other Topics Concern   Not on file  Social History Narrative   Not on file   Social Determinants of Health   Financial Resource Strain: Not on file  Food Insecurity: Not on file  Transportation Needs: Not on file  Physical Activity: Not on file  Stress: Not on file  Social Connections: Not on file     Vital Signs: There were no vitals taken for this visit.  Physical Exam Skin:    General: Skin is warm.     Comments: Sites are clean and dry NT no bleeding No infection Anterior drain intact OP brown fluid; ~ 100 cc every 2-3 days  Lateral/posterior drain was injected No fistula noted OP yellow brown 100 cc every 2-3 days  New dressings placed for both New bags for both   CT today shows improving collections-- not gone per Dr Annamaria Boots    Imaging: IR Radiologist Eval & Mgmt  Result Date: 02/04/2021 Please refer to notes tab for details about interventional procedure. (Op Note)   Labs:  CBC: Recent Labs    11/07/20 0402 11/08/20 0421 11/09/20 0356 11/10/20 0751  WBC 20.9* 19.2* 17.2* 12.1*  HGB 8.3* 8.0* 8.2* 8.1*  HCT 27.6* 26.6* 27.4* 27.3*  PLT 772* 710* 675* 588*    COAGS: Recent Labs    11/07/20 1820  INR 1.3*    BMP: Recent Labs     11/06/20 0442 11/07/20 0402 11/08/20 0421 11/09/20 0356  NA 135 136 135 136  K 3.4* 4.2 4.8 4.2  CL 99 101 102 100  CO2 '25 27 26 26  '$ GLUCOSE 118* 105* 99 103*  BUN '8 7 6 6  '$ CALCIUM 8.4* 8.6* 8.3* 8.8*  CREATININE 0.74 0.74 0.71 0.47  GFRNONAA >60 >60 >60 >60    LIVER FUNCTION TESTS: Recent Labs    11/04/20 1119  BILITOT 0.4  AST 15  ALT 12  ALKPHOS 66  PROT 7.9  ALBUMIN 2.5*  Assessment:  Diverticular abscess drains intact No flushes x 3-4 weeks Both to gravity bags Anterior drain plan is for continued downsize of drain-- eventual removal. This appt will be called to pt soon.  Lateral/posterior drain plan: continued no flush- continued gravity bag. Pt is to see Dr Joyice Faster 3 weeks No new imaging til after that visit.  She has good understanding of plan    Signed: Lavonia Drafts, PA-C 02/04/2021, 2:16 PM   Please refer to Dr. Annamaria Boots attestation of this note for management and plan.

## 2021-02-10 ENCOUNTER — Other Ambulatory Visit (HOSPITAL_COMMUNITY): Payer: Self-pay | Admitting: Interventional Radiology

## 2021-02-10 DIAGNOSIS — K5720 Diverticulitis of large intestine with perforation and abscess without bleeding: Secondary | ICD-10-CM

## 2021-02-11 ENCOUNTER — Encounter: Payer: Self-pay | Admitting: Internal Medicine

## 2021-02-14 ENCOUNTER — Other Ambulatory Visit: Payer: Self-pay

## 2021-02-14 ENCOUNTER — Ambulatory Visit
Admission: RE | Admit: 2021-02-14 | Discharge: 2021-02-14 | Disposition: A | Discharge status: Home / Self Care | Payer: BC Managed Care – PPO | Source: Ambulatory Visit | Attending: Internal Medicine | Admitting: Internal Medicine

## 2021-02-14 DIAGNOSIS — D1809 Hemangioma of other sites: Secondary | ICD-10-CM | POA: Diagnosis not present

## 2021-02-14 DIAGNOSIS — Q799 Congenital malformation of musculoskeletal system, unspecified: Secondary | ICD-10-CM

## 2021-02-14 DIAGNOSIS — R937 Abnormal findings on diagnostic imaging of other parts of musculoskeletal system: Secondary | ICD-10-CM

## 2021-02-14 DIAGNOSIS — M47817 Spondylosis without myelopathy or radiculopathy, lumbosacral region: Secondary | ICD-10-CM | POA: Diagnosis not present

## 2021-02-14 DIAGNOSIS — N281 Cyst of kidney, acquired: Secondary | ICD-10-CM | POA: Diagnosis not present

## 2021-02-14 DIAGNOSIS — M47816 Spondylosis without myelopathy or radiculopathy, lumbar region: Secondary | ICD-10-CM | POA: Diagnosis not present

## 2021-02-14 MED ORDER — GADOBENATE DIMEGLUMINE 529 MG/ML IV SOLN
20.0000 mL | Freq: Once | INTRAVENOUS | Status: AC | PRN
  Administered 2021-02-14: 20 mL via INTRAVENOUS

## 2021-02-18 ENCOUNTER — Encounter: Payer: Self-pay | Admitting: Internal Medicine

## 2021-02-19 ENCOUNTER — Ambulatory Visit (HOSPITAL_COMMUNITY)
Admission: RE | Admit: 2021-02-19 | Discharge: 2021-02-19 | Disposition: A | Discharge status: Home / Self Care | Payer: BC Managed Care – PPO | Source: Ambulatory Visit | Attending: Interventional Radiology | Admitting: Interventional Radiology

## 2021-02-19 ENCOUNTER — Other Ambulatory Visit: Payer: Self-pay

## 2021-02-19 ENCOUNTER — Other Ambulatory Visit (HOSPITAL_COMMUNITY): Payer: Self-pay | Admitting: Interventional Radiology

## 2021-02-19 DIAGNOSIS — Z4803 Encounter for change or removal of drains: Secondary | ICD-10-CM | POA: Diagnosis not present

## 2021-02-19 DIAGNOSIS — K5720 Diverticulitis of large intestine with perforation and abscess without bleeding: Secondary | ICD-10-CM

## 2021-02-19 DIAGNOSIS — Z434 Encounter for attention to other artificial openings of digestive tract: Secondary | ICD-10-CM | POA: Diagnosis not present

## 2021-02-19 DIAGNOSIS — K632 Fistula of intestine: Secondary | ICD-10-CM | POA: Diagnosis not present

## 2021-02-19 HISTORY — PX: IR CM INJ ANY COLONIC TUBE W/FLUORO: IMG2336

## 2021-02-19 HISTORY — PX: IR CATHETER TUBE CHANGE: IMG717

## 2021-02-19 HISTORY — PX: IR SINUS/FIST TUBE CHK-NON GI: IMG673

## 2021-02-19 MED ORDER — IOHEXOL 240 MG/ML SOLN
50.0000 mL | Freq: Once | INTRAMUSCULAR | Status: AC | PRN
  Administered 2021-02-19: 10 mL

## 2021-02-19 MED ORDER — LIDOCAINE HCL 1 % IJ SOLN
INTRAMUSCULAR | Status: AC
  Filled 2021-02-19: qty 20

## 2021-02-19 MED ORDER — LIDOCAINE HCL (PF) 1 % IJ SOLN
INTRAMUSCULAR | Status: DC | PRN
  Administered 2021-02-19: 5 mL

## 2021-02-20 ENCOUNTER — Other Ambulatory Visit (HOSPITAL_COMMUNITY): Payer: Self-pay | Admitting: Interventional Radiology

## 2021-02-20 ENCOUNTER — Encounter (HOSPITAL_COMMUNITY): Payer: Self-pay

## 2021-02-20 DIAGNOSIS — K5720 Diverticulitis of large intestine with perforation and abscess without bleeding: Secondary | ICD-10-CM

## 2021-03-02 DIAGNOSIS — K5720 Diverticulitis of large intestine with perforation and abscess without bleeding: Secondary | ICD-10-CM | POA: Diagnosis not present

## 2021-03-12 ENCOUNTER — Encounter: Payer: Self-pay | Admitting: Internal Medicine

## 2021-03-12 ENCOUNTER — Ambulatory Visit (INDEPENDENT_AMBULATORY_CARE_PROVIDER_SITE_OTHER): Payer: BC Managed Care – PPO | Admitting: Internal Medicine

## 2021-03-12 VITALS — BP 124/72 | HR 130 | Ht 63.0 in | Wt 204.0 lb

## 2021-03-12 DIAGNOSIS — K5732 Diverticulitis of large intestine without perforation or abscess without bleeding: Secondary | ICD-10-CM | POA: Diagnosis not present

## 2021-03-12 MED ORDER — DICYCLOMINE HCL 10 MG PO CAPS: 10.0000 mg | ORAL_CAPSULE | Freq: Three times a day (TID) | ORAL | 2 refills | Status: DC | PRN

## 2021-03-12 NOTE — Progress Notes (Signed)
Chief Complaint: Consideration of colonoscopy s/p diverticulitis  HPI : 35 year old female with history of obesity, OSA, perforated diverticulitis c/b abscesses presents for consideration of colonoscopy after an episode of diverticulitis  She was first diagnosed with diverticulitis in late 10/2020. She had two drains placed in 11/2020. Has had 3 drain exchanges in the interim. She still has some issues with gas pains (travels from right side of the abdomen to the left side) and some pains around the drainage sites. This pain has gotten better over time, but does feel similar to the pain she felt when she was first diagnosed with diverticulitis. She has a poor appetite. She is having at least one BM per day, usually goes 2-3 BMs per day. Denies hematochezia or melena. Has had some nausea. Denies dysphagia. Denies prior EGD or colonoscopy. Mom had colon cancer that was diagnosed at age 32. Last dose of antibiotics was about 2 months ago.  Wt Readings from Last 3 Encounters:  03/12/21 204 lb (92.5 kg)  01/27/21 250 lb (113.4 kg)  12/12/20 250 lb (113.4 kg)    Past Medical History:  Diagnosis Date   Allergy    seasonal   Anemia    Blood infection 1985   Blood transfusion without reported diagnosis    Diverticulitis    Hypertension    Obesity    Sleep apnea      Past Surgical History:  Procedure Laterality Date   IR CATHETER TUBE CHANGE  12/12/2020   IR CATHETER TUBE CHANGE  01/27/2021   IR CATHETER TUBE CHANGE  02/19/2021   IR RADIOLOGIST EVAL & MGMT  11/27/2020   IR RADIOLOGIST EVAL & MGMT  12/11/2020   IR RADIOLOGIST EVAL & MGMT  01/07/2021   IR RADIOLOGIST EVAL & MGMT  02/04/2021   IR SINUS/FIST TUBE CHK-NON GI  12/12/2020   IR SINUS/FIST TUBE CHK-NON GI  02/19/2021   Family History  Problem Relation Age of Onset   Cancer Mother        breast, and in remission   Diabetes Mother    Hypertension Mother    Colon cancer Mother        dx at age 68   Congestive Heart Failure  Father    Hypertension Father    Diabetes Maternal Grandmother    Hypertension Maternal Grandmother    Diabetes Maternal Grandfather    Kidney disease Maternal Grandfather    Diabetes Paternal Grandmother    Cancer Paternal Grandfather        colon   Autism Son    Heart disease Paternal Aunt    Cancer Paternal Uncle        colon   Social History   Tobacco Use   Smoking status: Never   Smokeless tobacco: Never  Vaping Use   Vaping Use: Never used  Substance Use Topics   Alcohol use: Not Currently   Drug use: No   Current Outpatient Medications  Medication Sig Dispense Refill   acetaminophen (TYLENOL) 500 MG tablet Take 1,000 mg by mouth every 6 (six) hours as needed for moderate pain or headache.     amLODipine (NORVASC) 10 MG tablet TAKE 1 TABLET BY MOUTH EVERY DAY (Patient taking differently: Take 10 mg by mouth every evening.) 90 tablet 3   benzonatate (TESSALON PERLES) 100 MG capsule Take 1 capsule (100 mg total) by mouth every 6 (six) hours as needed. 30 capsule 1   lisinopril-hydrochlorothiazide (ZESTORETIC) 20-12.5 MG tablet TAKE 2 TABLETS BY MOUTH  EVERY DAY 180 tablet 3   metoprolol succinate (TOPROL-XL) 25 MG 24 hr tablet Take 1 tablet (25 mg total) by mouth 2 (two) times daily. 180 tablet 1   Simethicone (GAS-X PO) Take 1 tablet by mouth daily as needed (gas).     Sodium Chloride Flush (NORMAL SALINE FLUSH) 0.9 % SOLN Flush as directed 850 mL 0   sodium chloride flush (NS) 0.9 % SOLN 5 mLs by Intracatheter route every 8 (eight) hours. 5 Syringe 1   traMADol (ULTRAM) 50 MG tablet Take 50-100 mg by mouth every 6 (six) hours as needed for pain.     No current facility-administered medications for this visit.   Allergies  Allergen Reactions   Shellfish Allergy Hives   Penicillins Hives and Rash    Has patient had a PCN reaction causing immediate rash, facial/tongue/throat swelling, SOB or lightheadedness with hypotension: No Has patient had a PCN reaction causing  severe rash involving mucus membranes or skin necrosis: hives Has patient had a PCN reaction that required hospitalization No Has patient had a PCN reaction occurring within the last 10 years: yes If all of the above answers are "NO", then may proceed with Cephalosporin use.      Review of Systems: All systems reviewed and negative except where noted in HPI.   Physical Exam: BP 124/72   Pulse (!) 130   Ht 5\' 3"  (1.6 m)   Wt 204 lb (92.5 kg) Comment: per patient  LMP 12/05/2020 (Approximate)   BMI 36.14 kg/m  Constitutional: Pleasant female HEENT: Normocephalic and atraumatic. Conjunctivae are normal. No scleral icterus. Cardiovascular: Normal rate, regular rhythm.  Pulmonary/chest: Effort normal and breath sounds normal. No wheezing, rales or rhonchi. Abdominal: Soft, nondistended, two drains in place (one in the anterior portion of the left abdomen with minimal drainage and another in the lateral portion of the left abdomen with cloudy drainage) Extremities: No edema Neurological: Alert and oriented to person place and time. Skin: Skin is warm and dry. No rashes noted. Psychiatric: Mildly anxious  Labs 11/2020: BMP unremarkable, CBC with mildly elevated leukocytosis of 12.1  CT A/P w/o contrast 11/04/20: IMPRESSION: 1. Perforating descending colonic diverticulitis with multiple abdominopelvic gas and fluid collections, measuring up to 5.7 cm and further described above. 2. Cholelithiasis without findings of acute cholecystitis. 3. 3.6 cm benign left adrenal adenoma. 4. Leiomyomatous uterus. 5. 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  CT A/P w/o contrast 11/07/20: IMPRESSION: 1. Perforating descending colonic diverticulitis with multiple abdominopelvic gas and fluid collections, measuring up to 5.7 cm and further described above. 2. Cholelithiasis without findings of acute  cholecystitis. 3. 3.6 cm benign left adrenal adenoma. 4. Leiomyomatous uterus. 5. 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  CT A/P w/o contrast 11/27/20: IMPRESSION: 1. Significantly interval decreased size of the previously visualized left retroperitoneal and left anterior mid abdominal fluid collections. There is persistent fat stranding and mural thickening of the mid descending colon at this level. 2. Unchanged leiomyomatous uterus. 3. Unchanged cholelithiasis.  CT A/P w/o contrast 12/11/20: IMPRESSION: 1. Stable inflammatory changes centered around the distal descending colon in the left lower abdomen. Pericolonic inflammatory changes have minimally changed since 11/27/2020. Small pocket of gas medial to the colon is probably associated with a fistula or small residual abscess collection. 2. Stable position of the two percutaneous  drains. The more anterior drain may be extending through a portion of the small bowel. 3. Cholelithiasis. 4. Fibroid uterus. Cannot exclude an ovarian/adnexal cystic structure near the uterine fundus. 5. Left adrenal adenoma  CT A/P w/contrast 01/09/21: IMPRESSION: 1. No new abscesses identified. Similar degree of soft tissue thickening seen in the left pericolonic region. The degree of persistent soft tissues thickening in the pericolonic space further raises suspicions for malignancy. Further evaluation with colonoscopy should be performed. 2. Left retroperitoneal abscess drain unchanged in position. Anterior left abdominal drain again seen terminating within small bowel loop. 3. 2.6 cm mildly sclerotic lesion noted in the L2 vertebral body. Further evaluation with contrast enhanced lumbar spine MRI should be performed.  CT A/P w/o contrast 02/04/21: IMPRESSION: No new abdominopelvic collections or abscess development in the 1 month interval.   Left anterior drain remains within a loop of small bowel, unchanged.  Left lateral abscess drain remains in the retroperitoneal space adjacent to the iliopsoas muscle with a small amount of residual fluid and air but no measurable collection.  Stable soft tissue prominence and pericolonic strandy edema/inflammation about the left descending colon compatible with residual diverticulitis/colitis. Difficult to exclude underlying transmural lesion.  IR Catheter Tube Change Result Date: 02/19/2021 INDICATION: Presumed diverticular abscess, status post percutaneous drainage x2 11/07/2020. The anterior drain is within the lumen of small bowel. Posterior drain has decreasing output. EXAM: CATHETER EXCHANGE UNDER FLUOROSCOPY CATHETER INJECTION UNDER FLUOROSCOPY MEDICATIONS: none indicated ANESTHESIA/SEDATION: None required COMPLICATIONS: None immediate. PROCEDURE: Informed written consent was obtained from the patient after a thorough discussion of the procedural risks, benefits and alternatives. All questions were addressed. Maximal Sterile Barrier Technique was utilized including caps, mask, sterile gowns, sterile gloves, sterile drape, hand hygiene and skin antiseptic. A timeout was performed prior to the initiation of the procedure. Small contrast injection through the anterior catheter confirms placement within lumen of small bowel which is decompressed. Catheter was cut and exchanged over a Bentson wire for a new 6 French pigtail drain catheter, formed within the lumen of the bowel. Small contrast injection confirms appropriate positioning. The catheter was secured externally with StatLock and placed to gravity drain bag. Injection of the posterolateral pigtail drain catheter shows fistula to bowel extending medially from the catheter. There is only a small residual abscess cavity lateral to the pigtail. The catheter was left in place to external gravity drainage. IMPRESSION: 1. 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 2. 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 Electronically Signed   By: Lucrezia Europe M.D.   On: 02/19/2021 12:46    ASSESSMENT AND PLAN: Perforated diverticulitis c/b abscesses s/p 2 percutaneous drains Patient had a complicated episode of diverticulitis in 10/2020.  She was referred for consideration of colonoscopy procedure after diverticulitis.  However, her most recent CT scan in 01/2021 continues to show signs of inflammation in the left ascending descending colon compatible with residual diverticulitis or colitis.  Unclear whether or not this represents chronic smoldering diverticulitis or is residual swelling from her recent bout with acute diverticulitis. Patient does continue to have some signs of abdominal pain, though this is improved from prior. She has seen surgery as an outpatient and only wishes to pursue surgery as a last resort. Thus we will plan for repeat CT A/P to see if the inflammation in the descending colon has resolved. If colonoscopy were to be performed while there is still diverticulitis present, she would be at increased risk  for perforation. However, she is also about 5-6 months out from her original diagnosis of diverticulitis so would also not want to delay a diagnosis of possible malignancy for too long. - Bentyl 10 mg TID PRN for abdominal discomfort  - CT A/P w/contrast. Once results are back, then will plan to discuss the idea of colonoscopy further with the patient.  Christia Reading, MD

## 2021-03-12 NOTE — Patient Instructions (Addendum)
If you are age 37 or older, your body mass index should be between 23-30. Your Body mass index is 36.14 kg/m. If this is out of the aforementioned range listed, please consider follow up with your Primary Care Provider.  If you are age 50 or younger, your body mass index should be between 19-25. Your Body mass index is 36.14 kg/m. If this is out of the aformentioned range listed, please consider follow up with your Primary Care Provider.   You have been scheduled for a CT scan of the abdomen and pelvis at [facility], 1st floor Radiology. You are scheduled on 03/19/21  at 4pm. You should arrive 15 minutes prior to your appointment time for registration.  Please pick up 2 bottles of contrast from [place] at least 3 days prior to your scan. The solution may taste better if refrigerated, but do NOT add ice or any other liquid to this solution. Shake well before drinking.   Please follow the written instructions below on the day of your exam:   1) Do not eat anything after 12 pm (4 hours prior to your test)   2) Drink 1 bottle of contrast @ 2pm (2 hours prior to your exam)  Remember to shake well before drinking and do NOT pour over ice.     Drink 1 bottle of contrast @ 3pm (1 hour prior to your exam)   You may take any medications as prescribed with a small amount of water, if necessary. If you take any of the following medications: METFORMIN, GLUCOPHAGE, GLUCOVANCE, AVANDAMET, RIOMET, FORTAMET, [place] MET, JANUMET, GLUMETZA or METAGLIP, you MAY be asked to HOLD this medication 48 hours AFTER the exam.   The purpose of you drinking the oral contrast is to aid in the visualization of your intestinal tract. The contrast solution may cause some diarrhea. Depending on your individual set of symptoms, you may also receive an intravenous injection of x-ray contrast/dye. Plan on being at [facility] for 45 minutes or longer, depending on the type of exam you are having performed.   It was a  pleasure to see you today!  Thank you for trusting me with your gastrointestinal care!    Dayna Barker, MD  If you have any questions regarding your exam or if you need to reschedule, you may call [facility] at [phone] between the hours of 8:00 am and 5:00 pm, Monday-Friday.     __________________________________________________________  The [facility] providers would like to encourage you to use [facility] to communicate with providers for non-urgent requests or questions.  Due to long hold times on the telephone, sending your provider a message by [facility] may be a faster and more efficient way to get a response.  Please allow 48 business hours for a response.  Please remember that this is for non-urgent requests.

## 2021-03-16 NOTE — Addendum Note (Signed)
Encounter addended by: Shakeira Rhee H on: 03/16/2021 3:00 PM  Actions taken: Imaging Exam ended

## 2021-03-19 ENCOUNTER — Ambulatory Visit (HOSPITAL_COMMUNITY): Payer: BC Managed Care – PPO

## 2021-03-23 ENCOUNTER — Ambulatory Visit: Payer: BC Managed Care – PPO | Admitting: Cardiology

## 2021-03-26 NOTE — Addendum Note (Signed)
Addended by: Hardie Pulley, Sandralee Tarkington J on: 03/26/2021 03:27 PM   Modules accepted: Orders

## 2021-04-01 ENCOUNTER — Ambulatory Visit (HOSPITAL_COMMUNITY)
Admission: RE | Admit: 2021-04-01 | Discharge: 2021-04-01 | Disposition: A | Discharge status: Home / Self Care | Payer: BC Managed Care – PPO | Source: Ambulatory Visit | Attending: Internal Medicine | Admitting: Internal Medicine

## 2021-04-01 ENCOUNTER — Other Ambulatory Visit: Payer: Self-pay

## 2021-04-01 DIAGNOSIS — K760 Fatty (change of) liver, not elsewhere classified: Secondary | ICD-10-CM | POA: Diagnosis not present

## 2021-04-01 DIAGNOSIS — K5732 Diverticulitis of large intestine without perforation or abscess without bleeding: Secondary | ICD-10-CM | POA: Diagnosis not present

## 2021-04-01 LAB — POCT I-STAT CREATININE: Creatinine, Ser: <0.2 mg/dL — ABNORMAL LOW (ref 0.44–1.00)

## 2021-04-01 MED ORDER — IOHEXOL 350 MG/ML SOLN
80.0000 mL | Freq: Once | INTRAVENOUS | Status: AC | PRN
  Administered 2021-04-01: 80 mL via INTRAVENOUS

## 2021-04-02 NOTE — Progress Notes (Deleted)
Cardiology Office Note:    Date:  04/02/2021   ID:  Mikayla Rios, DOB 10-17-1985, MRN [id number]  PCP:  [name], MD  Cardiologist:  None  Electrophysiologist:  None   Referring MD: [name], MD   No chief complaint on file.   History of Present Illness:    Mikayla Rios is a 35 y.o. female with a hx of hypertension, OSA who presents for follow-up. She was referred by Minette Brine, FNP for evaluation of tachycardia, initially seen on 10/18/2019. She reports that she has been having episodes where she feels like her heart is racing.  Occurs 1-2 times per day.  Denies any lightheadedness or syncope.  Denies any chest pain but does report she has been having dyspnea with exertion.  States that she been going for 5-minute walks, but has been limited by back pain and shortness of breath.  Does report intermittent lower extremity edema.  Reports she has gained about 20 pounds in the last year.  States that she checks her BP at home, has been in the 110s to 180s over 60s to 80s.  No smoking history.  Father was diagnosed with CHF in 81s.  Echocardiogram on 11/09/2019 showed normal biventricular function, no significant valvular disease. Zio patch x3 days on 11/12/2019 showed no significant arrhythmias.  Since last clinic visit,   she reports she has been doing well.  Lost 12 lbs since last clinic visit.  Stopped eating meat for awhile and now eating only chicken.  Has been doing home exercises on stairs.  Denies any chest pain or dyspnea.   Rare palpitations, only occurring about once per month now and lasting for a few seconds.  Reports BP has been 120-130/70s when checks at home.   Wt Readings from Last 3 Encounters:  03/12/21 204 lb (92.5 kg)  01/27/21 250 lb (113.4 kg)  12/12/20 250 lb (113.4 kg)     Past Medical History:  Diagnosis Date   Allergy    seasonal   Anemia    Blood infection 1985   Blood transfusion without reported diagnosis    Diverticulitis     Hypertension    Obesity    Sleep apnea     Past Surgical History:  Procedure Laterality Date   IR CATHETER TUBE CHANGE  12/12/2020   IR CATHETER TUBE CHANGE  01/27/2021   IR CATHETER TUBE CHANGE  02/19/2021   IR RADIOLOGIST EVAL & MGMT  11/27/2020   IR RADIOLOGIST EVAL & MGMT  12/11/2020   IR RADIOLOGIST EVAL & MGMT  01/07/2021   IR RADIOLOGIST EVAL & MGMT  02/04/2021   IR SINUS/FIST TUBE CHK-NON GI  12/12/2020   IR SINUS/FIST TUBE CHK-NON GI  02/19/2021    Current Medications: No outpatient medications have been marked as taking for the 04/03/21 encounter (Appointment) with [name], MD.     Allergies:   Shellfish allergy and Penicillins   Social History   Socioeconomic History   Marital status: Married    Spouse name: Errol   Number of children: 3   Years of education: Not on file   Highest education level: Not on file  Occupational History   Not on file  Tobacco Use   Smoking status: Never   Smokeless tobacco: Never  Vaping Use   Vaping Use: Never used  Substance and Sexual Activity   Alcohol use: Not Currently   Drug use: No   Sexual activity: Yes    Partners: Male  Birth control/protection: None  Other Topics Concern   Not on file  Social History Narrative   Not on file   Social Determinants of Health   Financial Resource Strain: Not on file  Food Insecurity: Not on file  Transportation Needs: Not on file  Physical Activity: Not on file  Stress: Not on file  Social Connections: Not on file     Family History: The patient's family history includes Autism in her son; Cancer in her mother, paternal grandfather, and paternal uncle; Colon cancer in her mother; Congestive Heart Failure in her father; Diabetes in her maternal grandfather, maternal grandmother, mother, and paternal grandmother; Heart disease in her paternal aunt; Hypertension in her father, maternal grandmother, and mother; Kidney disease in her maternal grandfather.  ROS:    Please see the history of present illness.     All other systems reviewed and are negative.  EKGs/Labs/Other Studies Reviewed:    The following studies were reviewed today:  EKG:  EKG is ordered today.  The ekg ordered today demonstrates sinus tachycardia, rate 118, nonspecific T wave flattening  Recent Labs: 11/04/2020: ALT 12 11/07/2020: Magnesium 1.9 11/09/2020: BUN 6; Potassium 4.2; Sodium 136 11/10/2020: Hemoglobin 8.1; Platelets 588 04/01/2021: Creatinine, Ser <0.20  Recent Lipid Panel    Component Value Date/Time   CHOL 155 10/02/2019 1127   TRIG 88 10/02/2019 1127   HDL 39 (L) 10/02/2019 1127   CHOLHDL 4.0 10/02/2019 1127   LDLCALC 99 10/02/2019 1127    Physical Exam:    VS:  There were no vitals taken for this visit.    Wt Readings from Last 3 Encounters:  03/12/21 204 lb (92.5 kg)  01/27/21 250 lb (113.4 kg)  12/12/20 250 lb (113.4 kg)     GEN: in no acute distress HEENT: Normal NECK: No JVD LYMPHATICS: No lymphadenopathy CARDIAC: tachycardic, no murmurs, rubs, gallops RESPIRATORY:  Clear to auscultation without rales, wheezing or rhonchi  ABDOMEN: Soft, non-tender, non-distended MUSCULOSKELETAL:  No edema; No deformity  SKIN: Warm and dry NEUROLOGIC:  Alert and oriented x 3 PSYCHIATRIC:  Normal affect   ASSESSMENT:    No diagnosis found.  PLAN:     Tachycardia/palpitations:  Zio patch x3 days on 11/12/2019 showed no significant arrhythmias.  Reports palpitations have improved.  Dyspnea: Suspect related to deconditioning.  Echocardiogram on 11/09/2019 showed normal biventricular function, no significant valvular disease.  Hypertension: On lisinopril-hydrochlorothiazide 40-25 mg, Toprol-XL 25 mg twice daily, amlodipine 10 mg daily. Appears controlled.   OSA: Encourage compliance with CPAP  RTC in***  Medication Adjustments/Labs and Tests Ordered: Current medicines are reviewed at length with the patient today.  Concerns regarding medicines are outlined  above.  No orders of the defined types were placed in this encounter.  No orders of the defined types were placed in this encounter.   There are no Patient Instructions on file for this visit.   Signed, [name], MD  04/02/2021 9:51 PM    [facility]

## 2021-04-03 ENCOUNTER — Ambulatory Visit: Payer: BC Managed Care – PPO | Admitting: Cardiology

## 2021-04-07 DIAGNOSIS — C801 Malignant (primary) neoplasm, unspecified: Secondary | ICD-10-CM

## 2021-04-07 HISTORY — DX: Malignant (primary) neoplasm, unspecified: C80.1

## 2021-04-09 ENCOUNTER — Other Ambulatory Visit: Payer: Self-pay | Admitting: Nurse Practitioner

## 2021-04-09 DIAGNOSIS — I10 Essential (primary) hypertension: Secondary | ICD-10-CM

## 2021-04-10 ENCOUNTER — Encounter (HOSPITAL_COMMUNITY): Payer: Self-pay

## 2021-04-10 ENCOUNTER — Inpatient Hospital Stay (HOSPITAL_COMMUNITY)
Admission: EM | Admit: 2021-04-10 | Discharge: 2021-05-01 | DRG: 329 | Disposition: A | Discharge status: Rehabilitation Facility | Payer: BC Managed Care – PPO | Attending: Internal Medicine | Admitting: Internal Medicine

## 2021-04-10 ENCOUNTER — Emergency Department (HOSPITAL_COMMUNITY): Payer: BC Managed Care – PPO

## 2021-04-10 DIAGNOSIS — A419 Sepsis, unspecified organism: Secondary | ICD-10-CM | POA: Diagnosis not present

## 2021-04-10 DIAGNOSIS — N390 Urinary tract infection, site not specified: Secondary | ICD-10-CM | POA: Diagnosis present

## 2021-04-10 DIAGNOSIS — R6521 Severe sepsis with septic shock: Secondary | ICD-10-CM | POA: Diagnosis not present

## 2021-04-10 DIAGNOSIS — Y848 Other medical procedures as the cause of abnormal reaction of the patient, or of later complication, without mention of misadventure at the time of the procedure: Secondary | ICD-10-CM | POA: Diagnosis present

## 2021-04-10 DIAGNOSIS — D75838 Other thrombocytosis: Secondary | ICD-10-CM | POA: Diagnosis present

## 2021-04-10 DIAGNOSIS — R188 Other ascites: Secondary | ICD-10-CM | POA: Diagnosis not present

## 2021-04-10 DIAGNOSIS — R000 Tachycardia, unspecified: Secondary | ICD-10-CM | POA: Diagnosis not present

## 2021-04-10 DIAGNOSIS — K5780 Diverticulitis of intestine, part unspecified, with perforation and abscess without bleeding: Secondary | ICD-10-CM | POA: Diagnosis not present

## 2021-04-10 DIAGNOSIS — Z4682 Encounter for fitting and adjustment of non-vascular catheter: Secondary | ICD-10-CM | POA: Diagnosis not present

## 2021-04-10 DIAGNOSIS — M7989 Other specified soft tissue disorders: Secondary | ICD-10-CM | POA: Diagnosis not present

## 2021-04-10 DIAGNOSIS — J9601 Acute respiratory failure with hypoxia: Secondary | ICD-10-CM | POA: Diagnosis not present

## 2021-04-10 DIAGNOSIS — K651 Peritoneal abscess: Secondary | ICD-10-CM | POA: Diagnosis not present

## 2021-04-10 DIAGNOSIS — C187 Malignant neoplasm of sigmoid colon: Secondary | ICD-10-CM

## 2021-04-10 DIAGNOSIS — Z452 Encounter for adjustment and management of vascular access device: Secondary | ICD-10-CM

## 2021-04-10 DIAGNOSIS — Z1509 Genetic susceptibility to other malignant neoplasm: Secondary | ICD-10-CM

## 2021-04-10 DIAGNOSIS — D6489 Other specified anemias: Secondary | ICD-10-CM | POA: Diagnosis present

## 2021-04-10 DIAGNOSIS — Z79899 Other long term (current) drug therapy: Secondary | ICD-10-CM

## 2021-04-10 DIAGNOSIS — Z800 Family history of malignant neoplasm of digestive organs: Secondary | ICD-10-CM

## 2021-04-10 DIAGNOSIS — B9620 Unspecified Escherichia coli [E. coli] as the cause of diseases classified elsewhere: Secondary | ICD-10-CM | POA: Diagnosis present

## 2021-04-10 DIAGNOSIS — T85618A Breakdown (mechanical) of other specified internal prosthetic devices, implants and grafts, initial encounter: Secondary | ICD-10-CM | POA: Diagnosis present

## 2021-04-10 DIAGNOSIS — Z8249 Family history of ischemic heart disease and other diseases of the circulatory system: Secondary | ICD-10-CM | POA: Diagnosis not present

## 2021-04-10 DIAGNOSIS — Z978 Presence of other specified devices: Secondary | ICD-10-CM

## 2021-04-10 DIAGNOSIS — R739 Hyperglycemia, unspecified: Secondary | ICD-10-CM | POA: Diagnosis not present

## 2021-04-10 DIAGNOSIS — K8020 Calculus of gallbladder without cholecystitis without obstruction: Secondary | ICD-10-CM | POA: Diagnosis not present

## 2021-04-10 DIAGNOSIS — K631 Perforation of intestine (nontraumatic): Secondary | ICD-10-CM | POA: Diagnosis not present

## 2021-04-10 DIAGNOSIS — Z933 Colostomy status: Secondary | ICD-10-CM | POA: Diagnosis not present

## 2021-04-10 DIAGNOSIS — K5651 Intestinal adhesions [bands], with partial obstruction: Secondary | ICD-10-CM | POA: Diagnosis not present

## 2021-04-10 DIAGNOSIS — R001 Bradycardia, unspecified: Secondary | ICD-10-CM | POA: Diagnosis not present

## 2021-04-10 DIAGNOSIS — K5720 Diverticulitis of large intestine with perforation and abscess without bleeding: Principal | ICD-10-CM | POA: Diagnosis present

## 2021-04-10 DIAGNOSIS — E278 Other specified disorders of adrenal gland: Secondary | ICD-10-CM | POA: Diagnosis not present

## 2021-04-10 DIAGNOSIS — R6510 Systemic inflammatory response syndrome (SIRS) of non-infectious origin without acute organ dysfunction: Secondary | ICD-10-CM | POA: Diagnosis not present

## 2021-04-10 DIAGNOSIS — E8770 Fluid overload, unspecified: Secondary | ICD-10-CM | POA: Diagnosis not present

## 2021-04-10 DIAGNOSIS — K5792 Diverticulitis of intestine, part unspecified, without perforation or abscess without bleeding: Secondary | ICD-10-CM | POA: Diagnosis not present

## 2021-04-10 DIAGNOSIS — Z01810 Encounter for preprocedural cardiovascular examination: Secondary | ICD-10-CM | POA: Diagnosis not present

## 2021-04-10 DIAGNOSIS — D62 Acute posthemorrhagic anemia: Secondary | ICD-10-CM | POA: Diagnosis not present

## 2021-04-10 DIAGNOSIS — E871 Hypo-osmolality and hyponatremia: Secondary | ICD-10-CM | POA: Diagnosis present

## 2021-04-10 DIAGNOSIS — K6819 Other retroperitoneal abscess: Secondary | ICD-10-CM | POA: Diagnosis present

## 2021-04-10 DIAGNOSIS — R5381 Other malaise: Secondary | ICD-10-CM | POA: Diagnosis not present

## 2021-04-10 DIAGNOSIS — J9811 Atelectasis: Secondary | ICD-10-CM | POA: Diagnosis not present

## 2021-04-10 DIAGNOSIS — Z20822 Contact with and (suspected) exposure to covid-19: Secondary | ICD-10-CM | POA: Diagnosis not present

## 2021-04-10 DIAGNOSIS — C186 Malignant neoplasm of descending colon: Secondary | ICD-10-CM | POA: Diagnosis not present

## 2021-04-10 DIAGNOSIS — J9690 Respiratory failure, unspecified, unspecified whether with hypoxia or hypercapnia: Secondary | ICD-10-CM | POA: Diagnosis not present

## 2021-04-10 DIAGNOSIS — E669 Obesity, unspecified: Secondary | ICD-10-CM | POA: Diagnosis present

## 2021-04-10 DIAGNOSIS — E8809 Other disorders of plasma-protein metabolism, not elsewhere classified: Secondary | ICD-10-CM | POA: Diagnosis present

## 2021-04-10 DIAGNOSIS — I10 Essential (primary) hypertension: Secondary | ICD-10-CM | POA: Diagnosis present

## 2021-04-10 DIAGNOSIS — Z6839 Body mass index (BMI) 39.0-39.9, adult: Secondary | ICD-10-CM

## 2021-04-10 DIAGNOSIS — Z8616 Personal history of COVID-19: Secondary | ICD-10-CM

## 2021-04-10 DIAGNOSIS — C179 Malignant neoplasm of small intestine, unspecified: Secondary | ICD-10-CM | POA: Diagnosis present

## 2021-04-10 DIAGNOSIS — Z9181 History of falling: Secondary | ICD-10-CM

## 2021-04-10 DIAGNOSIS — I951 Orthostatic hypotension: Secondary | ICD-10-CM | POA: Diagnosis present

## 2021-04-10 DIAGNOSIS — K56600 Partial intestinal obstruction, unspecified as to cause: Secondary | ICD-10-CM | POA: Diagnosis not present

## 2021-04-10 DIAGNOSIS — T81599A Other complications of foreign body accidentally left in body following unspecified procedure, initial encounter: Secondary | ICD-10-CM | POA: Diagnosis not present

## 2021-04-10 DIAGNOSIS — D124 Benign neoplasm of descending colon: Secondary | ICD-10-CM | POA: Diagnosis not present

## 2021-04-10 DIAGNOSIS — K529 Noninfective gastroenteritis and colitis, unspecified: Secondary | ICD-10-CM | POA: Diagnosis not present

## 2021-04-10 DIAGNOSIS — E43 Unspecified severe protein-calorie malnutrition: Secondary | ICD-10-CM | POA: Diagnosis present

## 2021-04-10 DIAGNOSIS — C188 Malignant neoplasm of overlapping sites of colon: Secondary | ICD-10-CM | POA: Diagnosis not present

## 2021-04-10 DIAGNOSIS — R609 Edema, unspecified: Secondary | ICD-10-CM | POA: Diagnosis not present

## 2021-04-10 DIAGNOSIS — E876 Hypokalemia: Secondary | ICD-10-CM | POA: Diagnosis not present

## 2021-04-10 DIAGNOSIS — R52 Pain, unspecified: Secondary | ICD-10-CM | POA: Diagnosis not present

## 2021-04-10 DIAGNOSIS — Z833 Family history of diabetes mellitus: Secondary | ICD-10-CM | POA: Diagnosis not present

## 2021-04-10 DIAGNOSIS — J439 Emphysema, unspecified: Secondary | ICD-10-CM | POA: Diagnosis not present

## 2021-04-10 DIAGNOSIS — D638 Anemia in other chronic diseases classified elsewhere: Secondary | ICD-10-CM | POA: Diagnosis present

## 2021-04-10 DIAGNOSIS — I4711 Inappropriate sinus tachycardia, so stated: Secondary | ICD-10-CM | POA: Diagnosis present

## 2021-04-10 DIAGNOSIS — R9431 Abnormal electrocardiogram [ECG] [EKG]: Secondary | ICD-10-CM | POA: Diagnosis present

## 2021-04-10 DIAGNOSIS — N739 Female pelvic inflammatory disease, unspecified: Secondary | ICD-10-CM

## 2021-04-10 DIAGNOSIS — T85698A Other mechanical complication of other specified internal prosthetic devices, implants and grafts, initial encounter: Secondary | ICD-10-CM | POA: Diagnosis not present

## 2021-04-10 DIAGNOSIS — D649 Anemia, unspecified: Secondary | ICD-10-CM | POA: Diagnosis not present

## 2021-04-10 DIAGNOSIS — G4733 Obstructive sleep apnea (adult) (pediatric): Secondary | ICD-10-CM | POA: Diagnosis not present

## 2021-04-10 DIAGNOSIS — R531 Weakness: Secondary | ICD-10-CM | POA: Diagnosis not present

## 2021-04-10 DIAGNOSIS — R601 Generalized edema: Secondary | ICD-10-CM | POA: Diagnosis not present

## 2021-04-10 DIAGNOSIS — Z9911 Dependence on respirator [ventilator] status: Secondary | ICD-10-CM | POA: Diagnosis not present

## 2021-04-10 DIAGNOSIS — Z91013 Allergy to seafood: Secondary | ICD-10-CM

## 2021-04-10 DIAGNOSIS — E66813 Obesity, class 3: Secondary | ICD-10-CM

## 2021-04-10 DIAGNOSIS — T85698D Other mechanical complication of other specified internal prosthetic devices, implants and grafts, subsequent encounter: Secondary | ICD-10-CM | POA: Diagnosis not present

## 2021-04-10 DIAGNOSIS — Z880 Allergy status to penicillin: Secondary | ICD-10-CM

## 2021-04-10 DIAGNOSIS — K5669 Other partial intestinal obstruction: Secondary | ICD-10-CM | POA: Diagnosis not present

## 2021-04-10 DIAGNOSIS — N281 Cyst of kidney, acquired: Secondary | ICD-10-CM | POA: Diagnosis not present

## 2021-04-10 DIAGNOSIS — R269 Unspecified abnormalities of gait and mobility: Secondary | ICD-10-CM | POA: Diagnosis not present

## 2021-04-10 DIAGNOSIS — C189 Malignant neoplasm of colon, unspecified: Secondary | ICD-10-CM | POA: Diagnosis not present

## 2021-04-10 DIAGNOSIS — Z6841 Body Mass Index (BMI) 40.0 and over, adult: Secondary | ICD-10-CM | POA: Diagnosis not present

## 2021-04-10 DIAGNOSIS — I4729 Other ventricular tachycardia: Secondary | ICD-10-CM | POA: Diagnosis not present

## 2021-04-10 DIAGNOSIS — F419 Anxiety disorder, unspecified: Secondary | ICD-10-CM | POA: Diagnosis present

## 2021-04-10 LAB — COMPREHENSIVE METABOLIC PANEL
ALT: 19 U/L (ref 0–44)
AST: 16 U/L (ref 15–41)
Albumin: 1.6 g/dL — ABNORMAL LOW (ref 3.5–5.0)
Alkaline Phosphatase: 143 U/L — ABNORMAL HIGH (ref 38–126)
Anion gap: 11 (ref 5–15)
BUN: 17 mg/dL (ref 6–20)
CO2: 27 mmol/L (ref 22–32)
Calcium: 7.5 mg/dL — ABNORMAL LOW (ref 8.9–10.3)
Chloride: 97 mmol/L — ABNORMAL LOW (ref 98–111)
Creatinine, Ser: 0.46 mg/dL (ref 0.44–1.00)
GFR, Estimated: >60 mL/min (ref >60)
Glucose, Bld: 84 mg/dL (ref 70–99)
Potassium: 2.7 mmol/L — CL (ref 3.5–5.1)
Sodium: 135 mmol/L (ref 135–145)
Total Bilirubin: 0.6 mg/dL (ref 0.3–1.2)
Total Protein: 6.2 g/dL — ABNORMAL LOW (ref 6.5–8.1)

## 2021-04-10 LAB — CBC WITH DIFFERENTIAL/PLATELET
Abs Immature Granulocytes: 0.14 10*3/uL — ABNORMAL HIGH (ref 0.00–0.07)
Basophils Absolute: 0 10*3/uL (ref 0.0–0.1)
Basophils Relative: 0 %
Eosinophils Absolute: 0 10*3/uL (ref 0.0–0.5)
Eosinophils Relative: 0 %
HCT: 27.3 % — ABNORMAL LOW (ref 36.0–46.0)
Hemoglobin: 8.4 g/dL — ABNORMAL LOW (ref 12.0–15.0)
Immature Granulocytes: 1 %
Lymphocytes Relative: 10 %
Lymphs Abs: 1.4 10*3/uL (ref 0.7–4.0)
MCH: 27.5 pg (ref 26.0–34.0)
MCHC: 30.8 g/dL (ref 30.0–36.0)
MCV: 89.2 fL (ref 80.0–100.0)
Monocytes Absolute: 0.3 10*3/uL (ref 0.1–1.0)
Monocytes Relative: 2 %
Neutro Abs: 12.2 10*3/uL — ABNORMAL HIGH (ref 1.7–7.7)
Neutrophils Relative %: 87 %
Platelets: 777 10*3/uL — ABNORMAL HIGH (ref 150–400)
RBC: 3.06 MIL/uL — ABNORMAL LOW (ref 3.87–5.11)
RDW: 22.8 % — ABNORMAL HIGH (ref 11.5–15.5)
WBC: 14.1 10*3/uL — ABNORMAL HIGH (ref 4.0–10.5)
nRBC: 0 % (ref 0.0–0.2)

## 2021-04-10 LAB — LACTIC ACID, PLASMA: Lactic Acid, Venous: 1.5 mmol/L (ref 0.5–1.9)

## 2021-04-10 LAB — TROPONIN I (HIGH SENSITIVITY): Troponin I (High Sensitivity): 5 ng/L (ref <18)

## 2021-04-10 LAB — MAGNESIUM: Magnesium: 1.9 mg/dL (ref 1.7–2.4)

## 2021-04-10 LAB — RESP PANEL BY RT-PCR (FLU A&B, COVID) ARPGX2
Influenza A by PCR: NEGATIVE
Influenza B by PCR: NEGATIVE
SARS Coronavirus 2 by RT PCR: NEGATIVE

## 2021-04-10 LAB — HCG, QUANTITATIVE, PREGNANCY: hCG, Beta Chain, Quant, S: <1 m[IU]/mL (ref <5)

## 2021-04-10 MED ORDER — MORPHINE SULFATE (PF) 2 MG/ML IV SOLN: 2.0000 mg | INTRAVENOUS | Status: DC | PRN

## 2021-04-10 MED ORDER — LACTATED RINGERS IV BOLUS
1000.0000 mL | Freq: Once | INTRAVENOUS | Status: AC
  Administered 2021-04-10: 1000 mL via INTRAVENOUS

## 2021-04-10 MED ORDER — AMLODIPINE BESYLATE 10 MG PO TABS
10.0000 mg | ORAL_TABLET | Freq: Every evening | ORAL | Status: DC
  Administered 2021-04-10: 10 mg via ORAL
  Filled 2021-04-10: qty 1
  Filled 2021-04-10: qty 2

## 2021-04-10 MED ORDER — LISINOPRIL 20 MG PO TABS
20.0000 mg | ORAL_TABLET | Freq: Every day | ORAL | Status: DC
  Administered 2021-04-11 – 2021-04-12 (×2): 20 mg via ORAL
  Filled 2021-04-10 (×2): qty 1

## 2021-04-10 MED ORDER — POTASSIUM CHLORIDE 10 MEQ/100ML IV SOLN
10.0000 meq | INTRAVENOUS | Status: AC
  Administered 2021-04-10 (×3): 10 meq via INTRAVENOUS
  Filled 2021-04-10 (×3): qty 100

## 2021-04-10 MED ORDER — ACETAMINOPHEN 325 MG PO TABS
650.0000 mg | ORAL_TABLET | Freq: Four times a day (QID) | ORAL | Status: DC | PRN
  Administered 2021-04-11 – 2021-04-13 (×4): 650 mg via ORAL
  Filled 2021-04-10 (×4): qty 2

## 2021-04-10 MED ORDER — POTASSIUM CHLORIDE 2 MEQ/ML IV SOLN
INTRAVENOUS | Status: AC
  Filled 2021-04-10 (×6): qty 1000

## 2021-04-10 MED ORDER — IOHEXOL 350 MG/ML SOLN
80.0000 mL | Freq: Once | INTRAVENOUS | Status: AC | PRN
  Administered 2021-04-10: 80 mL via INTRAVENOUS

## 2021-04-10 MED ORDER — ACETAMINOPHEN 650 MG RE SUPP: 650.0000 mg | Freq: Four times a day (QID) | RECTAL | Status: DC | PRN

## 2021-04-10 MED ORDER — ONDANSETRON HCL 4 MG/2ML IJ SOLN: 4.0000 mg | Freq: Four times a day (QID) | INTRAMUSCULAR | Status: DC | PRN

## 2021-04-10 MED ORDER — MELATONIN 5 MG PO TABS: 10.0000 mg | ORAL_TABLET | Freq: Every evening | ORAL | Status: DC | PRN

## 2021-04-10 MED ORDER — MORPHINE SULFATE (PF) 2 MG/ML IV SOLN
2.0000 mg | INTRAVENOUS | Status: DC | PRN
  Administered 2021-04-11 – 2021-04-15 (×4): 2 mg via INTRAVENOUS
  Filled 2021-04-10 (×5): qty 1

## 2021-04-10 MED ORDER — POTASSIUM CHLORIDE CRYS ER 20 MEQ PO TBCR
40.0000 meq | EXTENDED_RELEASE_TABLET | Freq: Once | ORAL | Status: AC
  Administered 2021-04-10: 40 meq via ORAL
  Filled 2021-04-10: qty 2

## 2021-04-10 MED ORDER — ONDANSETRON HCL 4 MG PO TABS: 4.0000 mg | ORAL_TABLET | Freq: Four times a day (QID) | ORAL | Status: DC | PRN

## 2021-04-10 MED ORDER — DICYCLOMINE HCL 10 MG PO CAPS: 10.0000 mg | ORAL_CAPSULE | Freq: Three times a day (TID) | ORAL | Status: DC | PRN

## 2021-04-10 MED ORDER — METRONIDAZOLE 500 MG/100ML IV SOLN
500.0000 mg | Freq: Two times a day (BID) | INTRAVENOUS | Status: DC
  Administered 2021-04-10 – 2021-04-13 (×5): 500 mg via INTRAVENOUS
  Filled 2021-04-10 (×5): qty 100

## 2021-04-10 MED ORDER — SODIUM CHLORIDE 0.9 % IV SOLN
2.0000 g | Freq: Every day | INTRAVENOUS | Status: DC
  Administered 2021-04-10 – 2021-04-12 (×3): 2 g via INTRAVENOUS
  Filled 2021-04-10 (×3): qty 20

## 2021-04-10 MED ORDER — METOPROLOL SUCCINATE ER 25 MG PO TB24
25.0000 mg | ORAL_TABLET | Freq: Two times a day (BID) | ORAL | Status: DC
  Administered 2021-04-10 – 2021-04-13 (×6): 25 mg via ORAL
  Filled 2021-04-10 (×6): qty 1

## 2021-04-10 MED ORDER — SIMETHICONE 80 MG PO CHEW
80.0000 mg | CHEWABLE_TABLET | Freq: Every day | ORAL | Status: DC | PRN
  Administered 2021-04-12: 80 mg via ORAL
  Filled 2021-04-10 (×4): qty 1

## 2021-04-10 NOTE — Assessment & Plan Note (Signed)
   Substantial hypokalemia secondary to poor oral intake and intermittent development of loose stools  Replacing with intravenous potassium chloride  Monitoring potassium levels with serial chemistry

## 2021-04-10 NOTE — ED Notes (Signed)
IV team at the bedside. 

## 2021-04-10 NOTE — Assessment & Plan Note (Signed)
.   Resume patients home regimen of oral antihypertensives with exception of hydrochlorothiazide which is being held . Titrate antihypertensive regimen as necessary to achieve adequate BP control . PRN intravenous antihypertensives for excessively elevated blood pressure

## 2021-04-10 NOTE — ED Notes (Signed)
Patient unable to complete orthostatic vital signs due to weakness, RN aware.

## 2021-04-10 NOTE — ED Notes (Addendum)
Unsuccessful IV attempt for 2nd set of cultures and admission labs. Patient stated she did not want to be stuck again.

## 2021-04-10 NOTE — Assessment & Plan Note (Signed)
   Patient states that she does not typically use CPAP  Supplemental oxygen nightly with sleep

## 2021-04-10 NOTE — Assessment & Plan Note (Signed)
   Substantial thrombocytosis secondary to chronic inflammatory process  Supportive care with treatment of underlying condition with antibiotics as noted above  Monitoring with serial CBCs

## 2021-04-10 NOTE — H&P (Addendum)
History and Physical    Mikayla Rios NLZ:[id number] DOB: 1985/11/13 DOA: 04/10/2021  PCP: [name], MD  Patient coming from: Home   Chief Complaint:  Chief Complaint  Patient presents with   Weakness   Tachycardia     HPI:    35 year old female with past medical history of hypertension, inappropriate sinus tachycardia, obesity, obstructive sleep apnea and diverticulosis with complicated diverticulitis and abscess formation 11/2020 status post IR guided placement of 2 left lower quadrant drains who presents to [facility] emergency department with complaints of generalized weakness and lightheadedness.  Of note, patient was hospitalized at [facility] from 5/31 until 6/6 for abdominal pain with initial work-up showing perforated diverticulitis.  Patient was initially admitted to the surgical service and treated with intravenous antibiotics.  Due to clinical worsening and development of multiple diverticular abscesses I was consulted and to left lower quadrant drains were successfully placed with patient eventually being discharged on 6/6.  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  Patient underwent a posterior drain exchange on 7/8 followed by an anterior drain exchange on 8/23.  Patient continues to have the 2 drains in her left lower quadrant and over the course of the past several months is developed progressively worsening generalized weakness.  This weakness has become severe in intensity, is worse with any exertion whatsoever and is associated with bouts of lightheadedness.  This is also been associated with poor oral intake and gradual weight loss.  Patient has recently been seen by gastroenterology on 10/6 with eventual plan to undergo endoscopic work-up to rule out malignancy.  Patient reports that approximately 1 week ago with poor appetite patient explains that last Wednesday she had  an episode of severe weakness and fell when trying to get into her vehicle.  Patient reports having a similar episode earlier in the day on 11/4 associated with light headedness.  Due to patient's progressively worsening weakness and lightheadedness patient eventually presented to [facility] emergency department for evaluation.  Upon evaluation in the emergency department patient underwent CT imaging of the chest abdomen and pelvis.  CT angiogram of the chest revealed no evidence of pulmonary embolism however CT imaging of the abdomen and pelvis did reveal new enhancing fluid collection posterior to a segment of inflamed colon measuring 8.5 x 3.5 x 10.0 cm invading the adjacent iliopsoas muscle as well as extending through the left lateral abdominal wall.  Posterior drain is noted to be in the middle of this abscess.  The anterior drain appears to be completely dislodged into the subcutaneous tissues.  This is a dramatic change compared to the patient's recent CT imaging of the abdomen and pelvis on 10/28.  Patient was also found to have multiple SIRS criteria with concerns for early sepsis and therefore patient was given a 1 L bolus of lactated Ringer solution.  Patient is also found to be hypokalemic at 2.7 and was given 30 millequivalents of intravenous potassium chloride.  ER provider discussed case with Dr. Harlow Asa with general surgery who recommended medicine admission, antibiotics and stated that he would evaluate the patient in consultation the morning of 11/5.  The hospitalist group is now been called to assess the patient for admission to the hospital.    Review of Systems:   Review of Systems  Musculoskeletal:  Positive for falls.  Neurological:  Positive for dizziness and weakness.  All other systems reviewed and are  negative.  Past Medical History:  Diagnosis Date   Allergy    seasonal   Anemia    Blood infection 1985   Blood transfusion without reported diagnosis     Diverticulitis    Hypertension    Obesity    Sleep apnea     Past Surgical History:  Procedure Laterality Date   IR CATHETER TUBE CHANGE  12/12/2020   IR CATHETER TUBE CHANGE  01/27/2021   IR CATHETER TUBE CHANGE  02/19/2021   IR RADIOLOGIST EVAL & MGMT  11/27/2020   IR RADIOLOGIST EVAL & MGMT  12/11/2020   IR RADIOLOGIST EVAL & MGMT  01/07/2021   IR RADIOLOGIST EVAL & MGMT  02/04/2021   IR SINUS/FIST TUBE CHK-NON GI  12/12/2020   IR SINUS/FIST TUBE CHK-NON GI  02/19/2021     reports that she has never smoked. She has never used smokeless tobacco. She reports that she does not currently use alcohol. She reports that she does not use drugs.  Allergies  Allergen Reactions   Shellfish Allergy Hives   Penicillins Hives and Rash    Has patient had a PCN reaction causing immediate rash, facial/tongue/throat swelling, SOB or lightheadedness with hypotension: No Has patient had a PCN reaction causing severe rash involving mucus membranes or skin necrosis: hives Has patient had a PCN reaction that required hospitalization No Has patient had a PCN reaction occurring within the last 10 years: yes If all of the above answers are "NO", then may proceed with Cephalosporin use.     Family History  Problem Relation Age of Onset   Cancer Mother        breast, and in remission   Diabetes Mother    Hypertension Mother    Colon cancer Mother        dx at age 45   Congestive Heart Failure Father    Hypertension Father    Diabetes Maternal Grandmother    Hypertension Maternal Grandmother    Diabetes Maternal Grandfather    Kidney disease Maternal Grandfather    Diabetes Paternal Grandmother    Cancer Paternal Grandfather        colon   Autism Son    Heart disease Paternal Aunt    Cancer Paternal Uncle        colon     Prior to Admission medications   Medication Sig Start Date End Date Taking? Authorizing Provider  acetaminophen (TYLENOL) 500 MG tablet Take 1,000 mg by mouth every  6 (six) hours as needed for moderate pain or headache.    [provider]  amLODipine (NORVASC) 10 MG tablet TAKE 1 TABLET BY MOUTH EVERY DAY Patient taking differently: Take 10 mg by mouth every evening. 01/19/21   [name], MD  benzonatate (TESSALON PERLES) 100 MG capsule Take 1 capsule (100 mg total) by mouth every 6 (six) hours as needed. 10/06/20 10/06/21  Minette Brine, FNP  dicyclomine (BENTYL) 10 MG capsule Take 1 capsule (10 mg total) by mouth 3 (three) times daily as needed for spasms. 03/12/21   Sharyn Creamer, MD  lisinopril-hydrochlorothiazide (ZESTORETIC) 20-12.5 MG tablet TAKE 2 TABLETS BY MOUTH EVERY DAY 12/10/20   [name], MD  metoprolol succinate (TOPROL-XL) 25 MG 24 hr tablet TAKE 1 TABLET BY MOUTH TWICE A DAY 04/10/21   Ghumman, Ramandeep, NP  Simethicone (GAS-X PO) Take 1 tablet by mouth daily as needed (gas).    [provider]  Sodium Chloride Flush (NORMAL SALINE FLUSH) 0.9 % SOLN  Flush as directed 11/11/20   Sandi Mariscal, MD  sodium chloride flush (NS) 0.9 % SOLN 5 mLs by Intracatheter route every 8 (eight) hours. 11/11/20   Norm Parcel, PA-C  traMADol (ULTRAM) 50 MG tablet Take 50-100 mg by mouth every 6 (six) hours as needed for pain. 12/29/20   [provider]    Physical Exam: Vitals:   04/10/21 1730 04/10/21 1800 04/10/21 1850 04/10/21 1950  BP: (!) 150/120 (!) 141/107 (!) 144/88 (!) 146/85  Pulse:   (!) 125 (!) 134  Resp: 13 19 18  (!) 21  Temp:      TempSrc:      SpO2:   100% 100%  Weight:      Height:        Constitutional: Awake alert and oriented x3, no associated distress.   Skin: 2 drains noted to be in place coming from the left lower quadrant with only 1 drain actively draining scant amounts of green drainage.  No other rashes or lesions seen.  Poor skin turgor. Eyes: Pupils are equally reactive to light.  No evidence of scleral icterus or conjunctival pallor.  ENMT: Moist mucous membranes noted.   Posterior pharynx clear of any exudate or lesions.   Neck: normal, supple, no masses, no thyromegaly.  No evidence of jugular venous distension.   Respiratory: clear to auscultation bilaterally, no wheezing, no crackles. Normal respiratory effort. No accessory muscle use.  Cardiovascular: Tachycardic rate and regular rhythm, no murmurs / rubs / gallops. No extremity edema. 2+ pedal pulses. No carotid bruits.  Chest:   Nontender without crepitus or deformity.   Back:   Nontender without crepitus or deformity. Abdomen: Vague lower abdominal tenderness.  Abdomen is soft.  Notable fullness in the left lower quadrant.  Positive bowel sounds noted in all quadrants.   Musculoskeletal: No joint deformity upper and lower extremities. Good ROM, no contractures. Normal muscle tone.  Neurologic: CN 2-12 grossly intact. Sensation intact.  Patient moving all 4 extremities spontaneously.  Patient is following all commands.  Patient is responsive to verbal stimuli.   Psychiatric: Patient exhibits normal mood with appropriate affect.  Patient seems to possess insight as to their current situation.     Labs on Admission: I have personally reviewed following labs and imaging studies -   CBC: Recent Labs  Lab 04/10/21 1446  WBC 14.1*  NEUTROABS 12.2*  HGB 8.4*  HCT 27.3*  MCV 89.2  PLT 259*   Basic Metabolic Panel: Recent Labs  Lab 04/10/21 1446 04/10/21 1707  NA 135  --   K 2.7*  --   CL 97*  --   CO2 27  --   GLUCOSE 84  --   BUN 17  --   CREATININE 0.46  --   CALCIUM 7.5*  --   MG  --  1.9   GFR: Estimated Creatinine Clearance: 102.9 mL/min (by C-G formula based on SCr of 0.46 mg/dL). Liver Function Tests: Recent Labs  Lab 04/10/21 1446  AST 16  ALT 19  ALKPHOS 143*  BILITOT 0.6  PROT 6.2*  ALBUMIN 1.6*   No results for input(s): LIPASE, AMYLASE in the last 168 hours. No results for input(s): AMMONIA in the last 168 hours. Coagulation Profile: No results for input(s): INR,  PROTIME in the last 168 hours. Cardiac Enzymes: No results for input(s): CKTOTAL, CKMB, CKMBINDEX, TROPONINI in the last 168 hours. BNP (last 3 results) No results for input(s): PROBNP in the last 8760 hours. HbA1C: No  results for input(s): HGBA1C in the last 72 hours. CBG: No results for input(s): GLUCAP in the last 168 hours. Lipid Profile: No results for input(s): CHOL, HDL, LDLCALC, TRIG, CHOLHDL, LDLDIRECT in the last 72 hours. Thyroid Function Tests: No results for input(s): TSH, T4TOTAL, FREET4, T3FREE, THYROIDAB in the last 72 hours. Anemia Panel: No results for input(s): VITAMINB12, FOLATE, FERRITIN, TIBC, IRON, RETICCTPCT in the last 72 hours. Urine analysis:    Component Value Date/Time   COLORURINE YELLOW 11/04/2020 1119   APPEARANCEUR CLEAR 11/04/2020 1119   LABSPEC <1.005 (L) 11/04/2020 1119   PHURINE 5.5 11/04/2020 1119   GLUCOSEU NEGATIVE 11/04/2020 1119   HGBUR NEGATIVE 11/04/2020 1119   BILIRUBINUR NEGATIVE 11/04/2020 1119   BILIRUBINUR negative 07/29/2020 [place] 11/04/2020 1119   PROTEINUR NEGATIVE 11/04/2020 1119   UROBILINOGEN 0.2 07/29/2020 1647   NITRITE NEGATIVE 11/04/2020 1119   LEUKOCYTESUR LARGE (A) 11/04/2020 1119    Radiological Exams on Admission - Personally Reviewed: CT Angio Chest PE W and/or Wo Contrast  Result Date: 04/10/2021 CLINICAL DATA:  High probability PE. Being currently treated for abscess diverticulitis. Tachycardia. EXAM: CT ANGIOGRAPHY CHEST CT ABDOMEN AND PELVIS WITH CONTRAST TECHNIQUE: Multidetector CT imaging of the chest was performed using the standard protocol during bolus administration of intravenous contrast. Multiplanar CT image reconstructions and MIPs were obtained to evaluate the vascular anatomy. Multidetector CT imaging of the abdomen and pelvis was performed using the standard protocol during bolus administration of intravenous contrast. CONTRAST:  53mL OMNIPAQUE IOHEXOL 350 MG/ML SOLN COMPARISON:   CT abdomen and pelvis 04/01/2021. CT abdomen and pelvis 11/27/2020. FINDINGS: CTA CHEST FINDINGS Cardiovascular: Satisfactory opacification of the pulmonary arteries to the segmental level. No evidence of pulmonary embolism. Normal heart size. No pericardial effusion. Mediastinum/Nodes: No enlarged mediastinal, hilar, or axillary lymph nodes. Thyroid gland, trachea, and esophagus demonstrate no significant findings. Lungs/Pleura: Lungs are clear. No pleural effusion or pneumothorax. Musculoskeletal: No chest wall abnormality. No acute or significant osseous findings. Review of the MIP images confirms the above findings. CT ABDOMEN and PELVIS FINDINGS Hepatobiliary: There is diffuse fatty infiltration of the liver. Gallstones are present. There is no biliary ductal dilatation. Pancreas: Unremarkable. No pancreatic ductal dilatation or surrounding inflammatory changes. Spleen: Normal in size without focal abnormality. Adrenals/Urinary Tract: There is a 3.5 x 2.8 cm stable left adrenal nodule previously characterized as adenoma. Right adrenal gland is within normal limits. There is a cyst in the inferior pole the left kidney measuring 3 cm, unchanged. The kidneys otherwise appear within normal limits. The bladder is within normal limits. Stomach/Bowel: There is no evidence for bowel obstruction. There is marked focal wall thickening of the mid descending colon with surrounding inflammatory stranding. This has slightly increased when compared to the prior study. There is a small amount of extraluminal gas compatible with perforation. Gas has increased when compared to the prior examination. Stomach is within normal limits. Vascular/Lymphatic: No significant vascular findings are present. Aorta and IVC are normal in size. There are prominent and enlarged central mesenteric lymph nodes which are new from the prior examination. The largest lymph node measures 1.5 by 2.4 cm image 4/49. Reproductive: Again seen is a  hypodense lesion in the fundus of the uterus containing some calcifications measuring 4.7 cm similar to the prior study. Adnexa are within normal limits. Other: Previously identified percutaneous drainage catheter in the anterior left abdomen has been pulled back in the interval. The distal catheter tip is in the deep subcutaneous soft tissues  of the anterior abdominal wall. Previously identified left lateral percutaneous drainage catheter is unchanged in position. The distal portion of the catheter is in a new lobulated enhancing fluid collection containing air abutting the inflamed portion of the colon. This collection measures 8.5 x 3.5 x 10.0 cm and is now within the adjacent iliopsoas musculature and abutting the left iliacus musculature. This fluid collection also extends through the left lateral abdominal wall on image 4/51, a new finding. There is trace free fluid in the pelvis. There is no focal abdominal wall hernia. Musculoskeletal: No acute or significant osseous findings. Review of the MIP images confirms the above findings. IMPRESSION: 1. Again seen are findings compatible with descending colon diverticulitis with perforation. Free air and inflammation have increased in the interval. 2. New lobulated enhancing fluid collection posterior to this segment of inflamed colon measuring 8.5 x 3.5 x 10.0 cm. This collection now invades the adjacent iliopsoas muscle as well as extends through the left lateral abdominal wall. The tip of the drainage catheter is in this collection. Findings are compatible with abscess. 3. Anterior left percutaneous drainage catheter tip has been pulled back and is now within the subcutaneous tissues. 4. Trace free fluid. 5. No pulmonary embolism.  No acute cardiopulmonary process. 6. Cholelithiasis. 7. Fatty infiltration of the liver. Electronically Signed   By: Ronney Asters M.D.   On: 04/10/2021 19:01   CT ABDOMEN PELVIS W CONTRAST  Result Date: 04/10/2021 CLINICAL DATA:   High probability PE. Being currently treated for abscess diverticulitis. Tachycardia. EXAM: CT ANGIOGRAPHY CHEST CT ABDOMEN AND PELVIS WITH CONTRAST TECHNIQUE: Multidetector CT imaging of the chest was performed using the standard protocol during bolus administration of intravenous contrast. Multiplanar CT image reconstructions and MIPs were obtained to evaluate the vascular anatomy. Multidetector CT imaging of the abdomen and pelvis was performed using the standard protocol during bolus administration of intravenous contrast. CONTRAST:  71mL OMNIPAQUE IOHEXOL 350 MG/ML SOLN COMPARISON:  CT abdomen and pelvis 04/01/2021. CT abdomen and pelvis 11/27/2020. FINDINGS: CTA CHEST FINDINGS Cardiovascular: Satisfactory opacification of the pulmonary arteries to the segmental level. No evidence of pulmonary embolism. Normal heart size. No pericardial effusion. Mediastinum/Nodes: No enlarged mediastinal, hilar, or axillary lymph nodes. Thyroid gland, trachea, and esophagus demonstrate no significant findings. Lungs/Pleura: Lungs are clear. No pleural effusion or pneumothorax. Musculoskeletal: No chest wall abnormality. No acute or significant osseous findings. Review of the MIP images confirms the above findings. CT ABDOMEN and PELVIS FINDINGS Hepatobiliary: There is diffuse fatty infiltration of the liver. Gallstones are present. There is no biliary ductal dilatation. Pancreas: Unremarkable. No pancreatic ductal dilatation or surrounding inflammatory changes. Spleen: Normal in size without focal abnormality. Adrenals/Urinary Tract: There is a 3.5 x 2.8 cm stable left adrenal nodule previously characterized as adenoma. Right adrenal gland is within normal limits. There is a cyst in the inferior pole the left kidney measuring 3 cm, unchanged. The kidneys otherwise appear within normal limits. The bladder is within normal limits. Stomach/Bowel: There is no evidence for bowel obstruction. There is marked focal wall thickening of  the mid descending colon with surrounding inflammatory stranding. This has slightly increased when compared to the prior study. There is a small amount of extraluminal gas compatible with perforation. Gas has increased when compared to the prior examination. Stomach is within normal limits. Vascular/Lymphatic: No significant vascular findings are present. Aorta and IVC are normal in size. There are prominent and enlarged central mesenteric lymph nodes which are new from the prior examination. The  largest lymph node measures 1.5 by 2.4 cm image 4/49. Reproductive: Again seen is a hypodense lesion in the fundus of the uterus containing some calcifications measuring 4.7 cm similar to the prior study. Adnexa are within normal limits. Other: Previously identified percutaneous drainage catheter in the anterior left abdomen has been pulled back in the interval. The distal catheter tip is in the deep subcutaneous soft tissues of the anterior abdominal wall. Previously identified left lateral percutaneous drainage catheter is unchanged in position. The distal portion of the catheter is in a new lobulated enhancing fluid collection containing air abutting the inflamed portion of the colon. This collection measures 8.5 x 3.5 x 10.0 cm and is now within the adjacent iliopsoas musculature and abutting the left iliacus musculature. This fluid collection also extends through the left lateral abdominal wall on image 4/51, a new finding. There is trace free fluid in the pelvis. There is no focal abdominal wall hernia. Musculoskeletal: No acute or significant osseous findings. Review of the MIP images confirms the above findings. IMPRESSION: 1. Again seen are findings compatible with descending colon diverticulitis with perforation. Free air and inflammation have increased in the interval. 2. New lobulated enhancing fluid collection posterior to this segment of inflamed colon measuring 8.5 x 3.5 x 10.0 cm. This collection now invades  the adjacent iliopsoas muscle as well as extends through the left lateral abdominal wall. The tip of the drainage catheter is in this collection. Findings are compatible with abscess. 3. Anterior left percutaneous drainage catheter tip has been pulled back and is now within the subcutaneous tissues. 4. Trace free fluid. 5. No pulmonary embolism.  No acute cardiopulmonary process. 6. Cholelithiasis. 7. Fatty infiltration of the liver. Electronically Signed   By: Ronney Asters M.D.   On: 04/10/2021 19:01   DG Chest Portable 1 View  Result Date: 04/10/2021 CLINICAL DATA:  Weakness. EXAM: PORTABLE CHEST 1 VIEW COMPARISON:  March 31, 2016 FINDINGS: The heart size and mediastinal contours are within normal limits. Both lungs are clear. The visualized skeletal structures are unremarkable. IMPRESSION: No active disease. Electronically Signed   By: Virgina Norfolk M.D.   On: 04/10/2021 15:51    EKG: Personally reviewed.  Rhythm is tachycardia with heart rate of 140 bpm.  Evidence of diffuse T wave inversions.    Notably prolonged QTC of 525  Assessment/Plan  * Diverticulitis of large intestine with abscess Longstanding complicated history of diverticulitis diagnosed 11/2020 status post 2 left lower quadrant IR guided drains CT imaging of the abdomen pelvis reveals new development of a very large abscess posterior to an area of inflamed colon measuring 8.5 x 3.5 x 10 cm with invasion of the adjacent iliopsoas muscle and extending to the left lateral abdominal wall. Posterior drain tip is in the middle of the abscess and does not seem to be functioning.   Furthermore, the more anterior drain is also nonfunctioning and appears to have the tip in the subcutaneous tissue This is a new development compared to CT imaging performed on 10/28.  Malfunctioning drains may be due to the patient's recent fall last Wednesday Patient has been initiated on intravenous ceftriaxone and Flagyl for broad-spectrum coverage  including coverage of anaerobes as well as E. coli and strep anginosis both which grew out cultures 11/2020 Hydrating patient with intravenous isotonic fluid Drainage culture as well as blood cultures obtained ER provider discussed case with Dr. Harlow Asa with general surgery who will evaluate the patient tomorrow morning in consultation Will wait to  get IR involved until general surgery has opportunity to evaluate the patient and determine whether operative intervention is necessary.  SIRS (systemic inflammatory response syndrome) (HCC) While patient is exhibiting multiple positive SIRS criteria, tachycardia is obscured by known history of inappropriate sinus tachycardia Furthermore, no evidence of organ dysfunction or elevated SOFA score to suggest sepsis Remainder of assessment and plan as above  Essential hypertension Resume patients home regimen of oral antihypertensives with exception of hydrochlorothiazide which is being held Titrate antihypertensive regimen as necessary to achieve adequate BP control PRN intravenous antihypertensives for excessively elevated blood pressure    Hypokalemia, inadequate intake Substantial hypokalemia secondary to poor oral intake and intermittent development of loose stools Replacing with intravenous potassium chloride Monitoring potassium levels with serial chemistry  Obstructive sleep apnea Patient states that she does not typically use CPAP Supplemental oxygen nightly with sleep  Protein-calorie malnutrition, severe ([place]) Patient has been experiencing progressive weight loss and poor oral intake due to her prolonged illness Nutrition consultation placed  Inappropriate sinus tachycardia Longstanding known history of inappropriate sinus tachycardia Follows with outpatient cardiology Continue home regimen of metoprolol 25 mg twice daily Presence of inappropriate sinus tachycardia obscures evaluation of SIRS criteria  Anemia Likely anemia of  chronic disease considering prolonged inflammatory process Obtaining iron panel, folate, vita B12 Monitoring hemoglobin and hematocrit serial CBC No clinical evidence of active bleeding   Reactive thrombocytosis Substantial thrombocytosis secondary to chronic inflammatory process Supportive care with treatment of underlying condition with antibiotics as noted above Monitoring with serial CBCs  Prolonged QT interval Notable prolonged QTc intervals on EKG of 525 ms Likely exacerbated by substantial hypokalemia Monitoring patient on telemetry Limiting QT prolonging agents Correcting electrolyte abnormality Serial EKGs     Code Status:  Full code  code status decision has been confirmed with: patient Family Communication: deferred   Status is: Inpatient  Remains inpatient appropriate because: Complicated diverticulitis with substantial abscess formation and service requiring broad-spectrum intravenous antibiotic therapy, intravenous volume resuscitation, correcting of substantial electrolyte abnormalities, involvement of expert consultation including general surgery and close conical monitoring.        Vernelle Emerald MD [facility] Pager [phone]  If 7PM-7AM, please contact night-coverage [email or web address] Use universal [place] password for that web site. If you do not have the password, please call the hospital operator.  04/10/2021, 9:34 PM

## 2021-04-10 NOTE — ED Notes (Signed)
PT to CT at this time.

## 2021-04-10 NOTE — Assessment & Plan Note (Signed)
   Likely anemia of chronic disease considering prolonged inflammatory process  Obtaining iron panel, folate, vita B12  Monitoring hemoglobin and hematocrit serial CBC  No clinical evidence of active bleeding

## 2021-04-10 NOTE — ED Notes (Signed)
IV team re-paged.

## 2021-04-10 NOTE — Assessment & Plan Note (Addendum)
   Notable prolonged QTc intervals on EKG of 525 ms  Likely exacerbated by substantial hypokalemia  Monitoring patient on telemetry  Limiting QT prolonging agents  Correcting electrolyte abnormality  Serial EKGs

## 2021-04-10 NOTE — Assessment & Plan Note (Signed)
   Patient has been experiencing progressive weight loss and poor oral intake due to her prolonged illness  Nutrition consultation placed

## 2021-04-10 NOTE — ED Notes (Addendum)
K 2.7, PA Christian called but no answer. Billy Fischer, MD notified via phone.

## 2021-04-10 NOTE — ED Provider Notes (Signed)
[place] DEPT Provider Note   CSN: [id number] Arrival date & time: 04/10/21  1422     History Chief Complaint  Patient presents with   Weakness   Tachycardia    Mikayla Rios is a 35 y.o. female with a past medical history significant for hypertension, obstructive sleep apnea, tachycardia, anemia and most recently a complicated course of diverticulitis with abscess formation, with 2 drains in place in her left lower quadrant since June who presents with worsening weakness, syncope, lightheadedness, dizziness.  Patient is dealing with chronic diverticulitis, with recent drain exchange without complication last month, recent CT of the lower abdomen around 1 week ago with no acute changing findings.  Plans for patient to be scheduled for colonoscopy in the near future given recent weight loss, complications of diverticulitis, as well as family history of colon cancer.  Patient reports today because she had a syncopal episode, where she stood up to walk to the door, became lightheaded, and fell slowly to the knees, had to be supported to a chair.  Patient had a similar episode around Wednesday of last week.  Patient reports that she has not had very much of an appetite the last 2 weeks compared to her normal difficulties since her diverticulitis.  She endorses some diarrhea at this time, without bleeding.  No dysuria, urinary frequency.  Patient does follow with a cardiologist for her chronic tachycardia, Dr. Nechama Guard.  No fevers, chills, chest pain.  Patient does endorse some shortness of breath with exertion.   Weakness Associated symptoms: abdominal pain, diarrhea, dizziness and shortness of breath   Associated symptoms: no chest pain, no nausea and no vomiting       Past Medical History:  Diagnosis Date   Allergy    seasonal   Anemia    Blood infection 1985   Blood transfusion without reported diagnosis    Diverticulitis    Hypertension    Obesity     Sleep apnea     Patient Active Problem List   Diagnosis Date Noted   Diverticulitis of colon with perforation 11/04/2020   Absolute anemia 07/11/2019   Tachycardia 07/11/2019   OSA on CPAP 11/27/2018   Class 3 severe obesity due to excess calories with serious comorbidity and body mass index (BMI) of 60.0 to 69.9 in adult [facility])    SVD (spontaneous vaginal delivery) 12/16/2012    Past Surgical History:  Procedure Laterality Date   IR CATHETER TUBE CHANGE  12/12/2020   IR CATHETER TUBE CHANGE  01/27/2021   IR CATHETER TUBE CHANGE  02/19/2021   IR RADIOLOGIST EVAL & MGMT  11/27/2020   IR RADIOLOGIST EVAL & MGMT  12/11/2020   IR RADIOLOGIST EVAL & MGMT  01/07/2021   IR RADIOLOGIST EVAL & MGMT  02/04/2021   IR SINUS/FIST TUBE CHK-NON GI  12/12/2020   IR SINUS/FIST TUBE CHK-NON GI  02/19/2021     OB History     Gravida  3   Para  3   Term  3   Preterm  0   AB  0   Living  3      SAB  0   IAB  0   Ectopic  0   Multiple  0   Live Births  3           Family History  Problem Relation Age of Onset   Cancer Mother        breast, and in remission   Diabetes  Mother    Hypertension Mother    Colon cancer Mother        dx at age 15   Congestive Heart Failure Father    Hypertension Father    Diabetes Maternal Grandmother    Hypertension Maternal Grandmother    Diabetes Maternal Grandfather    Kidney disease Maternal Grandfather    Diabetes Paternal Grandmother    Cancer Paternal Grandfather        colon   Autism Son    Heart disease Paternal Aunt    Cancer Paternal Uncle        colon    Social History   Tobacco Use   Smoking status: Never   Smokeless tobacco: Never  Vaping Use   Vaping Use: Never used  Substance Use Topics   Alcohol use: Not Currently   Drug use: No    Home Medications Prior to Admission medications   Medication Sig Start Date End Date Taking? Authorizing Provider  acetaminophen (TYLENOL) 500 MG tablet Take 1,000 mg by  mouth every 6 (six) hours as needed for moderate pain or headache.    [provider]  amLODipine (NORVASC) 10 MG tablet TAKE 1 TABLET BY MOUTH EVERY DAY Patient taking differently: Take 10 mg by mouth every evening. 01/19/21   [name], MD  benzonatate (TESSALON PERLES) 100 MG capsule Take 1 capsule (100 mg total) by mouth every 6 (six) hours as needed. 10/06/20 10/06/21  Minette Brine, FNP  dicyclomine (BENTYL) 10 MG capsule Take 1 capsule (10 mg total) by mouth 3 (three) times daily as needed for spasms. 03/12/21   Sharyn Creamer, MD  lisinopril-hydrochlorothiazide (ZESTORETIC) 20-12.5 MG tablet TAKE 2 TABLETS BY MOUTH EVERY DAY 12/10/20   [name], MD  metoprolol succinate (TOPROL-XL) 25 MG 24 hr tablet TAKE 1 TABLET BY MOUTH TWICE A DAY 04/10/21   Ghumman, Ramandeep, NP  Simethicone (GAS-X PO) Take 1 tablet by mouth daily as needed (gas).    [provider]  Sodium Chloride Flush (NORMAL SALINE FLUSH) 0.9 % SOLN Flush as directed 11/11/20   Sandi Mariscal, MD  sodium chloride flush (NS) 0.9 % SOLN 5 mLs by Intracatheter route every 8 (eight) hours. 11/11/20   Norm Parcel, PA-C  traMADol (ULTRAM) 50 MG tablet Take 50-100 mg by mouth every 6 (six) hours as needed for pain. 12/29/20   [provider]    Allergies    Shellfish allergy and Penicillins  Review of Systems   Review of Systems  Constitutional:  Positive for fatigue.  Respiratory:  Positive for shortness of breath. Negative for chest tightness.   Cardiovascular:  Negative for chest pain and palpitations.  Gastrointestinal:  Positive for abdominal pain and diarrhea. Negative for nausea and vomiting.  Neurological:  Positive for dizziness, syncope, weakness and light-headedness.  All other systems reviewed and are negative.  Physical Exam Updated Vital Signs BP (!) 144/88   Pulse (!) 125   Temp 98.8 F (37.1 C) (Oral)   Resp 18   Ht 5\' 3"  (1.6 m)   Wt 90.7 kg   LMP 04/10/2021  (Approximate)   SpO2 100%   BMI 35.43 kg/m   Physical Exam Vitals and nursing note reviewed.  Constitutional:      General: She is not in acute distress.    Appearance: Normal appearance.  HENT:     Head: Normocephalic and atraumatic.  Eyes:     General:        Right eye:  No discharge.        Left eye: No discharge.  Cardiovascular:     Rate and Rhythm: Regular rhythm. Tachycardia present.     Heart sounds: No murmur heard.   No friction rub. No gallop.  Pulmonary:     Effort: Pulmonary effort is normal. No respiratory distress.     Breath sounds: Normal breath sounds.  Abdominal:     General: Bowel sounds are normal.     Palpations: Abdomen is soft.     Comments: Tender to palpation across the entire abdomen without rebound, rigidity, guarding, more focally tender in the left lower quadrant.  Skin:    General: Skin is warm and dry.     Capillary Refill: Capillary refill takes less than 2 seconds.  Neurological:     Mental Status: She is alert and oriented to person, place, and time.  Psychiatric:        Mood and Affect: Mood normal.        Behavior: Behavior normal.    ED Results / Procedures / Treatments   Labs (all labs ordered are listed, but only abnormal results are displayed) Labs Reviewed  CBC WITH DIFFERENTIAL/PLATELET - Abnormal; Notable for the following components:      Result Value   WBC 14.1 (*)    RBC 3.06 (*)    Hemoglobin 8.4 (*)    HCT 27.3 (*)    RDW 22.8 (*)    Platelets 777 (*)    Neutro Abs 12.2 (*)    Abs Immature Granulocytes 0.14 (*)    All other components within normal limits  COMPREHENSIVE METABOLIC PANEL - Abnormal; Notable for the following components:   Potassium 2.7 (*)    Chloride 97 (*)    Calcium 7.5 (*)    Total Protein 6.2 (*)    Albumin 1.6 (*)    Alkaline Phosphatase 143 (*)    All other components within normal limits  RESP PANEL BY RT-PCR (FLU A&B, COVID) ARPGX2  CULTURE, BLOOD (ROUTINE X 2)  CULTURE, BLOOD  (ROUTINE X 2)  LACTIC ACID, PLASMA  HCG, QUANTITATIVE, PREGNANCY  MAGNESIUM  URINALYSIS, ROUTINE W REFLEX MICROSCOPIC  LACTIC ACID, PLASMA  TROPONIN I (HIGH SENSITIVITY)    EKG EKG Interpretation  Date/Time:  Friday April 10 2021 14:34:40 EDT Ventricular Rate:  140 PR Interval:  88 QRS Duration: 87 QT Interval:  344 QTC Calculation: 525 R Axis:   51 Text Interpretation: Sinus tachycardia Probable LVH with secondary repol abnrm Abnormal T, consider ischemia, inferior leads Prolonged QT interval Similar tachycardia to previous, more diffuse TW abnormalities in comparison to previous Confirmed by Gareth Morgan [phone]) on 04/10/2021 5:43:51 PM  Radiology CT Angio Chest PE W and/or Wo Contrast  Result Date: 04/10/2021 CLINICAL DATA:  High probability PE. Being currently treated for abscess diverticulitis. Tachycardia. EXAM: CT ANGIOGRAPHY CHEST CT ABDOMEN AND PELVIS WITH CONTRAST TECHNIQUE: Multidetector CT imaging of the chest was performed using the standard protocol during bolus administration of intravenous contrast. Multiplanar CT image reconstructions and MIPs were obtained to evaluate the vascular anatomy. Multidetector CT imaging of the abdomen and pelvis was performed using the standard protocol during bolus administration of intravenous contrast. CONTRAST:  98mL OMNIPAQUE IOHEXOL 350 MG/ML SOLN COMPARISON:  CT abdomen and pelvis 04/01/2021. CT abdomen and pelvis 11/27/2020. FINDINGS: CTA CHEST FINDINGS Cardiovascular: Satisfactory opacification of the pulmonary arteries to the segmental level. No evidence of pulmonary embolism. Normal heart size. No pericardial effusion. Mediastinum/Nodes: No enlarged mediastinal, hilar, or axillary  lymph nodes. Thyroid gland, trachea, and esophagus demonstrate no significant findings. Lungs/Pleura: Lungs are clear. No pleural effusion or pneumothorax. Musculoskeletal: No chest wall abnormality. No acute or significant osseous findings. Review of  the MIP images confirms the above findings. CT ABDOMEN and PELVIS FINDINGS Hepatobiliary: There is diffuse fatty infiltration of the liver. Gallstones are present. There is no biliary ductal dilatation. Pancreas: Unremarkable. No pancreatic ductal dilatation or surrounding inflammatory changes. Spleen: Normal in size without focal abnormality. Adrenals/Urinary Tract: There is a 3.5 x 2.8 cm stable left adrenal nodule previously characterized as adenoma. Right adrenal gland is within normal limits. There is a cyst in the inferior pole the left kidney measuring 3 cm, unchanged. The kidneys otherwise appear within normal limits. The bladder is within normal limits. Stomach/Bowel: There is no evidence for bowel obstruction. There is marked focal wall thickening of the mid descending colon with surrounding inflammatory stranding. This has slightly increased when compared to the prior study. There is a small amount of extraluminal gas compatible with perforation. Gas has increased when compared to the prior examination. Stomach is within normal limits. Vascular/Lymphatic: No significant vascular findings are present. Aorta and IVC are normal in size. There are prominent and enlarged central mesenteric lymph nodes which are new from the prior examination. The largest lymph node measures 1.5 by 2.4 cm image 4/49. Reproductive: Again seen is a hypodense lesion in the fundus of the uterus containing some calcifications measuring 4.7 cm similar to the prior study. Adnexa are within normal limits. Other: Previously identified percutaneous drainage catheter in the anterior left abdomen has been pulled back in the interval. The distal catheter tip is in the deep subcutaneous soft tissues of the anterior abdominal wall. Previously identified left lateral percutaneous drainage catheter is unchanged in position. The distal portion of the catheter is in a new lobulated enhancing fluid collection containing air abutting the inflamed  portion of the colon. This collection measures 8.5 x 3.5 x 10.0 cm and is now within the adjacent iliopsoas musculature and abutting the left iliacus musculature. This fluid collection also extends through the left lateral abdominal wall on image 4/51, a new finding. There is trace free fluid in the pelvis. There is no focal abdominal wall hernia. Musculoskeletal: No acute or significant osseous findings. Review of the MIP images confirms the above findings. IMPRESSION: 1. Again seen are findings compatible with descending colon diverticulitis with perforation. Free air and inflammation have increased in the interval. 2. New lobulated enhancing fluid collection posterior to this segment of inflamed colon measuring 8.5 x 3.5 x 10.0 cm. This collection now invades the adjacent iliopsoas muscle as well as extends through the left lateral abdominal wall. The tip of the drainage catheter is in this collection. Findings are compatible with abscess. 3. Anterior left percutaneous drainage catheter tip has been pulled back and is now within the subcutaneous tissues. 4. Trace free fluid. 5. No pulmonary embolism.  No acute cardiopulmonary process. 6. Cholelithiasis. 7. Fatty infiltration of the liver. Electronically Signed   By: Ronney Asters M.D.   On: 04/10/2021 19:01   CT ABDOMEN PELVIS W CONTRAST  Result Date: 04/10/2021 CLINICAL DATA:  High probability PE. Being currently treated for abscess diverticulitis. Tachycardia. EXAM: CT ANGIOGRAPHY CHEST CT ABDOMEN AND PELVIS WITH CONTRAST TECHNIQUE: Multidetector CT imaging of the chest was performed using the standard protocol during bolus administration of intravenous contrast. Multiplanar CT image reconstructions and MIPs were obtained to evaluate the vascular anatomy. Multidetector CT imaging of the  abdomen and pelvis was performed using the standard protocol during bolus administration of intravenous contrast. CONTRAST:  47mL OMNIPAQUE IOHEXOL 350 MG/ML SOLN  COMPARISON:  CT abdomen and pelvis 04/01/2021. CT abdomen and pelvis 11/27/2020. FINDINGS: CTA CHEST FINDINGS Cardiovascular: Satisfactory opacification of the pulmonary arteries to the segmental level. No evidence of pulmonary embolism. Normal heart size. No pericardial effusion. Mediastinum/Nodes: No enlarged mediastinal, hilar, or axillary lymph nodes. Thyroid gland, trachea, and esophagus demonstrate no significant findings. Lungs/Pleura: Lungs are clear. No pleural effusion or pneumothorax. Musculoskeletal: No chest wall abnormality. No acute or significant osseous findings. Review of the MIP images confirms the above findings. CT ABDOMEN and PELVIS FINDINGS Hepatobiliary: There is diffuse fatty infiltration of the liver. Gallstones are present. There is no biliary ductal dilatation. Pancreas: Unremarkable. No pancreatic ductal dilatation or surrounding inflammatory changes. Spleen: Normal in size without focal abnormality. Adrenals/Urinary Tract: There is a 3.5 x 2.8 cm stable left adrenal nodule previously characterized as adenoma. Right adrenal gland is within normal limits. There is a cyst in the inferior pole the left kidney measuring 3 cm, unchanged. The kidneys otherwise appear within normal limits. The bladder is within normal limits. Stomach/Bowel: There is no evidence for bowel obstruction. There is marked focal wall thickening of the mid descending colon with surrounding inflammatory stranding. This has slightly increased when compared to the prior study. There is a small amount of extraluminal gas compatible with perforation. Gas has increased when compared to the prior examination. Stomach is within normal limits. Vascular/Lymphatic: No significant vascular findings are present. Aorta and IVC are normal in size. There are prominent and enlarged central mesenteric lymph nodes which are new from the prior examination. The largest lymph node measures 1.5 by 2.4 cm image 4/49. Reproductive: Again seen  is a hypodense lesion in the fundus of the uterus containing some calcifications measuring 4.7 cm similar to the prior study. Adnexa are within normal limits. Other: Previously identified percutaneous drainage catheter in the anterior left abdomen has been pulled back in the interval. The distal catheter tip is in the deep subcutaneous soft tissues of the anterior abdominal wall. Previously identified left lateral percutaneous drainage catheter is unchanged in position. The distal portion of the catheter is in a new lobulated enhancing fluid collection containing air abutting the inflamed portion of the colon. This collection measures 8.5 x 3.5 x 10.0 cm and is now within the adjacent iliopsoas musculature and abutting the left iliacus musculature. This fluid collection also extends through the left lateral abdominal wall on image 4/51, a new finding. There is trace free fluid in the pelvis. There is no focal abdominal wall hernia. Musculoskeletal: No acute or significant osseous findings. Review of the MIP images confirms the above findings. IMPRESSION: 1. Again seen are findings compatible with descending colon diverticulitis with perforation. Free air and inflammation have increased in the interval. 2. New lobulated enhancing fluid collection posterior to this segment of inflamed colon measuring 8.5 x 3.5 x 10.0 cm. This collection now invades the adjacent iliopsoas muscle as well as extends through the left lateral abdominal wall. The tip of the drainage catheter is in this collection. Findings are compatible with abscess. 3. Anterior left percutaneous drainage catheter tip has been pulled back and is now within the subcutaneous tissues. 4. Trace free fluid. 5. No pulmonary embolism.  No acute cardiopulmonary process. 6. Cholelithiasis. 7. Fatty infiltration of the liver. Electronically Signed   By: Ronney Asters M.D.   On: 04/10/2021 19:01   DG  Chest Portable 1 View  Result Date: 04/10/2021 CLINICAL DATA:   Weakness. EXAM: PORTABLE CHEST 1 VIEW COMPARISON:  March 31, 2016 FINDINGS: The heart size and mediastinal contours are within normal limits. Both lungs are clear. The visualized skeletal structures are unremarkable. IMPRESSION: No active disease. Electronically Signed   By: Virgina Norfolk M.D.   On: 04/10/2021 15:51    Procedures Procedures   Medications Ordered in ED Medications  potassium chloride 10 mEq in 100 mL IVPB (10 mEq Intravenous New Bag/Given 04/10/21 1906)  lactated ringers bolus 1,000 mL (0 mLs Intravenous Stopped 04/10/21 1946)  potassium chloride SA (KLOR-CON) CR tablet 40 mEq (40 mEq Oral Given 04/10/21 1901)  iohexol (OMNIPAQUE) 350 MG/ML injection 80 mL (80 mLs Intravenous Contrast Given 04/10/21 1820)    ED Course  I have reviewed the triage vital signs and the nursing notes.  Pertinent labs & imaging results that were available during my care of the patient were reviewed by me and considered in my medical decision making (see chart for details).    MDM Rules/Calculators/A&P                         I discussed this case with my attending physician who cosigned this note including patient's presenting symptoms, physical exam, and planned diagnostics and interventions. Attending physician stated agreement with plan or made changes to plan which were implemented.   Attending physician assessed patient at bedside.  Chronically ill female with a complicated history of diverticulitis represents a source for protein calorie malnutrition, fluid loss, worsening anemia.  Patient does have significant weight loss over the last several months.  Given complicated history of abdominal concerns with abscess in place, current tachycardia patient does meet SIRS criteria, high risk for intra-abdominal infection, will perform sepsis labs.  Other possible etiology of her syncope, weakness includes dehydration, protein calorie malnutrition, cardiogenic syncope, shortness of breath.  Will  obtain chest x-ray, troponin, EKG at this time.  Initial EKG does show some concerning for inferior ischemia.  Patient denies any chest pain.  We will hold off on CT of the abdomen at this time as patient's abdominal symptoms are not significantly changed from baseline, and most recent CT performed on 10/26.  After discussing patient's plan with Dr. Billy Fischer, will order CTA chest to evaluate for PE, as well as CT abdomen pelvis given patient's acute worsening in context of recent fall, complicated intra-abdominal history.  Work-up initially significant for leukocytosis of 14, with left shift.  Elevated platelet count of 777.  Chronic anemia which is stable today.  Albumin of 1.6, protein calorie malnutrition in the context of chronic diverticulitis, lack of appetite, diarrhea suspected.  CTA, troponin, chest x-ray do not reveal any acute cardiopulmonary process.  CT of the abdomen pelvis does reveal worsening fluid collection with free air, misplaced drain now sitting in subcutaneous tissue, there is one drain still in the fluid collection, no other new interval changes compared to CT 1 week ago.  As patient is feeling significantly more ill, worsening symptoms, worsening fluid collection will consult surgery for recommendations at this time.  General surgery will consult and see the patient in the morning.  Spoke to hospitalist for admission at this time, hospitalist accepts admission.  We will begin IV antibiotics at this time. Final Clinical Impression(s) / ED Diagnoses Final diagnoses:  Diverticulitis    Rx / DC Orders ED Discharge Orders     None  Anselmo Pickler, PA-C 04/10/21 1947    Gareth Morgan, MD 04/11/21 1142

## 2021-04-10 NOTE — Assessment & Plan Note (Signed)
   While patient is exhibiting multiple positive SIRS criteria, tachycardia is obscured by known history of inappropriate sinus tachycardia  Furthermore, no evidence of organ dysfunction or elevated SOFA score to suggest sepsis  Remainder of assessment and plan as above

## 2021-04-10 NOTE — Assessment & Plan Note (Signed)
   Longstanding complicated history of diverticulitis diagnosed 11/2020 status post 2 left lower quadrant IR guided drains  CT imaging of the abdomen pelvis reveals new development of a very large abscess posterior to an area of inflamed colon measuring 8.5 x 3.5 x 10 cm with invasion of the adjacent iliopsoas muscle and extending to the left lateral abdominal wall.  Posterior drain tip is in the middle of the abscess and does not seem to be functioning.    Furthermore, the more anterior drain is also nonfunctioning and appears to have the tip in the subcutaneous tissue  This is a new development compared to CT imaging performed on 10/28.  Malfunctioning drains may be due to the patient's recent fall last Wednesday  Patient has been initiated on intravenous ceftriaxone and Flagyl for broad-spectrum coverage including coverage of anaerobes as well as E. coli and strep anginosis both which grew out cultures 11/2020  Hydrating patient with intravenous isotonic fluid  Drainage culture as well as blood cultures obtained  ER provider discussed case with Dr. Harlow Asa with general surgery who will evaluate the patient tomorrow morning in consultation  Will wait to get IR involved until general surgery has opportunity to evaluate the patient and determine whether operative intervention is necessary.

## 2021-04-10 NOTE — ED Notes (Signed)
Pt is a difficult stick. EDP notified. IV team consult placed.

## 2021-04-10 NOTE — ED Notes (Signed)
Attempted [place] IV, unsuccessful.

## 2021-04-10 NOTE — ED Triage Notes (Signed)
Pt presents to the ED via EMS from home for weakness. Pt is currently being treated for "abscessed diverticulitis." Pt reports a known hx of tachycardia and low hgb and currently has a drain to the left lower abd. Pt denies any fever, chills, nausea or vomiting. Pt denies any CP or SOB. She states she has not taken her HTN medication, Metoprolol, or Lisinopril today.

## 2021-04-10 NOTE — Assessment & Plan Note (Signed)
   Longstanding known history of inappropriate sinus tachycardia  Follows with outpatient cardiology  Continue home regimen of metoprolol 25 mg twice daily  Presence of inappropriate sinus tachycardia obscures evaluation of SIRS criteria

## 2021-04-11 ENCOUNTER — Encounter (HOSPITAL_COMMUNITY): Payer: Self-pay | Admitting: Internal Medicine

## 2021-04-11 ENCOUNTER — Other Ambulatory Visit: Payer: Self-pay

## 2021-04-11 DIAGNOSIS — E876 Hypokalemia: Secondary | ICD-10-CM | POA: Diagnosis not present

## 2021-04-11 DIAGNOSIS — R000 Tachycardia, unspecified: Secondary | ICD-10-CM | POA: Diagnosis not present

## 2021-04-11 DIAGNOSIS — I10 Essential (primary) hypertension: Secondary | ICD-10-CM | POA: Diagnosis not present

## 2021-04-11 DIAGNOSIS — K5720 Diverticulitis of large intestine with perforation and abscess without bleeding: Secondary | ICD-10-CM | POA: Diagnosis not present

## 2021-04-11 LAB — CBC WITH DIFFERENTIAL/PLATELET
Band Neutrophils: 1 %
Basophils Relative: 0 %
Blasts: NONE SEEN %
Eosinophils Relative: 0 %
HCT: 26.8 % — ABNORMAL LOW (ref 36.0–46.0)
Hemoglobin: 8.4 g/dL — ABNORMAL LOW (ref 12.0–15.0)
Lymphocytes Relative: 7 %
MCH: 27.5 pg (ref 26.0–34.0)
MCHC: 31.3 g/dL (ref 30.0–36.0)
MCV: 87.9 fL (ref 80.0–100.0)
Metamyelocytes Relative: NONE SEEN %
Monocytes Relative: 3 %
Myelocytes: NONE SEEN %
Neutrophils Relative %: 89 %
Platelets: 696 10*3/uL — ABNORMAL HIGH (ref 150–400)
Promyelocytes Relative: NONE SEEN %
RBC Morphology: NORMAL
RBC: 3.05 MIL/uL — ABNORMAL LOW (ref 3.87–5.11)
RDW: 23.3 % — ABNORMAL HIGH (ref 11.5–15.5)
WBC Morphology: NORMAL
WBC: 15.4 10*3/uL — ABNORMAL HIGH (ref 4.0–10.5)
nRBC: 0.1 % (ref 0.0–0.2)
nRBC: 1 /100 WBC — ABNORMAL HIGH

## 2021-04-11 LAB — COMPREHENSIVE METABOLIC PANEL
ALT: 19 U/L (ref 0–44)
AST: 16 U/L (ref 15–41)
Albumin: 1.7 g/dL — ABNORMAL LOW (ref 3.5–5.0)
Alkaline Phosphatase: 147 U/L — ABNORMAL HIGH (ref 38–126)
Anion gap: 10 (ref 5–15)
BUN: 16 mg/dL (ref 6–20)
CO2: 25 mmol/L (ref 22–32)
Calcium: 7.6 mg/dL — ABNORMAL LOW (ref 8.9–10.3)
Chloride: 100 mmol/L (ref 98–111)
Creatinine, Ser: 0.49 mg/dL (ref 0.44–1.00)
GFR, Estimated: >60 mL/min (ref >60)
Glucose, Bld: 82 mg/dL (ref 70–99)
Potassium: 4.3 mmol/L (ref 3.5–5.1)
Sodium: 135 mmol/L (ref 135–145)
Total Bilirubin: 0.7 mg/dL (ref 0.3–1.2)
Total Protein: 6.2 g/dL — ABNORMAL LOW (ref 6.5–8.1)

## 2021-04-11 LAB — RETICULOCYTES
Immature Retic Fract: 29.1 % — ABNORMAL HIGH (ref 2.3–15.9)
RBC.: 2.51 MIL/uL — ABNORMAL LOW (ref 3.87–5.11)
Retic Count, Absolute: 97.9 10*3/uL (ref 19.0–186.0)
Retic Ct Pct: 3.9 % — ABNORMAL HIGH (ref 0.4–3.1)

## 2021-04-11 LAB — URINALYSIS, ROUTINE W REFLEX MICROSCOPIC
Bilirubin Urine: NEGATIVE
Glucose, UA: NEGATIVE mg/dL
Ketones, ur: 5 mg/dL — AB
Nitrite: POSITIVE — AB
Protein, ur: 30 mg/dL — AB
Specific Gravity, Urine: <=1.005 (ref 1.005–1.030)
WBC, UA: >50 WBC/hpf — ABNORMAL HIGH (ref 0–5)
pH: 6 (ref 5.0–8.0)

## 2021-04-11 LAB — APTT: aPTT: 28 seconds (ref 24–36)

## 2021-04-11 LAB — MAGNESIUM: Magnesium: 1.8 mg/dL (ref 1.7–2.4)

## 2021-04-11 LAB — PROTIME-INR
INR: 1.2 (ref 0.8–1.2)
Prothrombin Time: 15.1 seconds (ref 11.4–15.2)

## 2021-04-11 LAB — FOLATE: Folate: 3.1 ng/mL — ABNORMAL LOW (ref >5.9)

## 2021-04-11 MED ORDER — LACTATED RINGERS IV SOLN
INTRAVENOUS | Status: AC
Start: 2021-04-11 — End: 2021-04-16

## 2021-04-11 MED ORDER — ENOXAPARIN SODIUM 40 MG/0.4ML IJ SOSY
40.0000 mg | PREFILLED_SYRINGE | INTRAMUSCULAR | Status: DC
  Administered 2021-04-11 – 2021-04-16 (×6): 40 mg via SUBCUTANEOUS
  Filled 2021-04-11 (×6): qty 0.4

## 2021-04-11 MED ORDER — SODIUM CHLORIDE 0.9% FLUSH
10.0000 mL | Freq: Two times a day (BID) | INTRAVENOUS | Status: DC
  Administered 2021-04-12 – 2021-04-14 (×6): 10 mL

## 2021-04-11 NOTE — Progress Notes (Signed)
PROGRESS NOTE    Mikayla Rios  XLK:[id number] DOB: August 10, 1985 DOA: 04/10/2021 PCP: [name], MD (Confirm with patient/family/NH records and if not entered, this HAS to be entered at [place] point of entry. "No PCP" if truly none.)   Chief Complaint  Patient presents with   Weakness   Tachycardia    Brief Narrative:   35 year old female with past medical history of hypertension, inappropriate sinus tachycardia, obesity, obstructive sleep apnea and diverticulosis with complicated diverticulitis and abscess formation 11/2020 status post IR guided placement of 2 left lower quadrant drains who presents to [facility] emergency department with complaints of generalized weakness and lightheadedness.  Assessment & Plan:   Principal Problem:   Diverticulitis of large intestine with abscess Active Problems:   Obstructive sleep apnea   Anemia   Inappropriate sinus tachycardia   Essential hypertension   SIRS (systemic inflammatory response syndrome) (HCC)   Hypokalemia, inadequate intake   Protein-calorie malnutrition, severe (HCC)   Reactive thrombocytosis   Prolonged QT interval   Complicated diverticulitis with abscess s/p drain placement by IR twice in June 2022 with numerous exchanges since then due to persistent fistulous -CT of the abdomen and pelvis New lobulated enhancing fluid collection posterior to this segment of inflamed colon measuring 8.5 x 3.5 x 10.0 cm. This collection now invades the adjacent iliopsoas muscle as well as extends through the left lateral abdominal wall. -CT imaging today shows the anterior drain has been pulled back into subcutaneous tissue and hence it was removed by IR at bedside. The left lateral drain is within a large collection measuring about 8.5 into 10 cm. Patient reports no output.  It was flushed by IR and aspirated with return of purulent and foul-smelling material and the drain was connected to JP bulb. Plan for drain  exchange/upsize on Monday by IR Meanwhile patient is on IV Rocephin and Flagyl for intra-abdominal infection. General surgery on board and appreciate recommendations. Remains afebrile but with persistent leukocytosis. Cultures from the drain sent for analysis.    Pyuria Urine cultures ordered and patient already started on antibiotics.   Patient reports dizziness and an episode of syncope and an episode of mechanical fall Monitor on telemetry. CT angiogram of the chest ruled out PE EKG shows prolonged QT interval 636.  Nonspecific T wave inversions.troponin is negative.  Patient denies any chest pain or shortness of breath. Probably secondary to orthostatic hypotension from infection. Get orthostatic vital signs tomorrow and therapy evaluations will be ordered.   Hypokalemia Replaced,.   Prolonged QTc: - from hypokalemia and hypomagnesemia. - K is 4.3. magnesium is 1.8. Keep k >4 and mag>2.  Repeat EKG tonight.    Anemia of chronic disease: - normocytic.  - anemia panel pending.    Thrombocytosis:  Possibly from the infection.    Hypoalbuminemia:  - delaying the infection.  - will get dietary consult.    Inappropriate tachycardia:  Pt not symptomatic.  Echocardiogram ordered.  On metoprolol 25 mg BID.    Hypertension:  Well controlled.    DVT prophylaxis: scd's Code Status: (Full code) Family Communication: NONE AT BEDSIDE Disposition:   Status is: Inpatient  Remains inpatient appropriate because: IV ANTIBIOTICS       Consultants:  Surgery IR  Procedures:   Antimicrobials:  Antibiotics Given (last 72 hours)     Date/Time Action Medication Dose Rate   04/10/21 2220 New Bag/Given   cefTRIAXone (ROCEPHIN) 2 g in sodium chloride 0.9 % 100 mL IVPB 2 g  200 mL/hr   04/10/21 2233 New Bag/Given   metroNIDAZOLE (FLAGYL) IVPB 500 mg 500 mg 100 mL/hr   04/11/21 1021 New Bag/Given   metroNIDAZOLE (FLAGYL) IVPB 500 mg 500 mg 100 mL/hr          Subjective: Dizziness prior to the admission.   Objective: Vitals:   04/11/21 1200 04/11/21 1217 04/11/21 1300 04/11/21 1447  BP: (!) 135/103 (!) 145/106 (!) 150/107 109/75  Pulse: (!) 114 (!) 110 (!) 118 (!) 116  Resp: (!) 23 19 (!) 21 16  Temp:  98.4 F (36.9 C)  98 F (36.7 C)  TempSrc:  Oral  Oral  SpO2: 100% 100% 100% 100%  Weight:      Height:        Intake/Output Summary (Last 24 hours) at 04/11/2021 1604 Last data filed at 04/11/2021 1325 Gross per 24 hour  Intake 2850.8 ml  Output 1 ml  Net 2849.8 ml   Filed Weights   04/10/21 1436  Weight: 90.7 kg    Examination:  General exam: Appears calm and comfortable  Respiratory system: Clear to auscultation. Respiratory effort normal. Cardiovascular system: S1 & S2 heard, RRR.  No pedal edema. Gastrointestinal system: Abdomen is soft, mildly tender int he area of the drain. Pus in the drain.  Central nervous system: Alert and oriented. No focal neurological deficits. Extremities: Symmetric 5 x 5 power. Skin: No rashes, lesions or ulcers Psychiatry: Mood & affect appropriate.     Data Reviewed: I have personally reviewed following labs and imaging studies  CBC: Recent Labs  Lab 04/10/21 1446 04/11/21 0302  WBC 14.1* 15.4*  NEUTROABS 12.2*  --   HGB 8.4* 8.4*  HCT 27.3* 26.8*  MCV 89.2 87.9  PLT 777* 696*    Basic Metabolic Panel: Recent Labs  Lab 04/10/21 1446 04/10/21 1707 04/11/21 0302  NA 135  --  135  K 2.7*  --  4.3  CL 97*  --  100  CO2 27  --  25  GLUCOSE 84  --  82  BUN 17  --  16  CREATININE 0.46  --  0.49  CALCIUM 7.5*  --  7.6*  MG  --  1.9 1.8    GFR: Estimated Creatinine Clearance: 102.9 mL/min (by C-G formula based on SCr of 0.49 mg/dL).  Liver Function Tests: Recent Labs  Lab 04/10/21 1446 04/11/21 0302  AST 16 16  ALT 19 19  ALKPHOS 143* 147*  BILITOT 0.6 0.7  PROT 6.2* 6.2*  ALBUMIN 1.6* 1.7*    CBG: No results for input(s): GLUCAP in the last 168  hours.   Recent Results (from the past 240 hour(s))  Resp Panel by RT-PCR (Flu A&B, Covid) Nasopharyngeal Swab     Status: None   Collection Time: 04/10/21  2:46 PM   Specimen: Nasopharyngeal Swab; Nasopharyngeal(NP) swabs in vial transport medium  Result Value Ref Range Status   SARS Coronavirus 2 by RT PCR NEGATIVE NEGATIVE Final    Comment: (NOTE) SARS-CoV-2 target nucleic acids are NOT DETECTED.  The SARS-CoV-2 RNA is generally detectable in upper respiratory specimens during the acute phase of infection. The lowest concentration of SARS-CoV-2 viral copies this assay can detect is 138 copies/mL. A negative result does not preclude SARS-Cov-2 infection and should not be used as the sole basis for treatment or other patient management decisions. A negative result may occur with  improper specimen collection/handling, submission of specimen other than nasopharyngeal swab, presence of viral mutation(s) within the areas  targeted by this assay, and inadequate number of viral copies(<138 copies/mL). A negative result must be combined with clinical observations, patient history, and epidemiological information. The expected result is Negative.  Fact Sheet for Patients:  EntrepreneurPulse.com.au  Fact Sheet for Healthcare Providers:  IncredibleEmployment.be  This test is no t yet approved or cleared by the [place] FDA and  has been authorized for detection and/or diagnosis of SARS-CoV-2 by FDA under an Emergency Use Authorization (EUA). This EUA will remain  in effect (meaning this test can be used) for the duration of the COVID-19 declaration under Section 564(b)(1) of the Act, 21 U.S.C.section 360bbb-3(b)(1), unless the authorization is terminated  or revoked sooner.       Influenza A by PCR NEGATIVE NEGATIVE Final   Influenza B by PCR NEGATIVE NEGATIVE Final    Comment: (NOTE) The Xpert Xpress SARS-CoV-2/FLU/RSV plus assay is intended  as an aid in the diagnosis of influenza from Nasopharyngeal swab specimens and should not be used as a sole basis for treatment. Nasal washings and aspirates are unacceptable for Xpert Xpress SARS-CoV-2/FLU/RSV testing.  Fact Sheet for Patients: EntrepreneurPulse.com.au  Fact Sheet for Healthcare Providers: IncredibleEmployment.be  This test is not yet approved or cleared by the [place] FDA and has been authorized for detection and/or diagnosis of SARS-CoV-2 by FDA under an Emergency Use Authorization (EUA). This EUA will remain in effect (meaning this test can be used) for the duration of the COVID-19 declaration under Section 564(b)(1) of the Act, 21 U.S.C. section 360bbb-3(b)(1), unless the authorization is terminated or revoked.  Performed at [facility], [place] [address] [place], [place] [address]   Blood culture (routine x 2)     Status: None (Preliminary result)   Collection Time: 04/10/21  4:42 PM   Specimen: BLOOD  Result Value Ref Range Status   Specimen Description   Final    BLOOD RIGHT ANTECUBITAL Performed at [facility] [address] [place], [place] [address]    Special Requests   Final    BOTTLES DRAWN AEROBIC AND ANAEROBIC Blood Culture adequate volume Performed at [facility] [address] [place], [place] [address]    Culture   Final    NO GROWTH < 12 HOURS Performed at [facility] [address] [place], [place] [address]    Report Status PENDING  Incomplete  Aerobic/Anaerobic Culture w Gram Stain (surgical/deep wound)     Status: None (Preliminary result)   Collection Time: 04/10/21  7:56 PM   Specimen: Abscess  Result Value Ref Range Status   Specimen Description   Final    ABSCESS Performed at [facility] [address] [place], [place] [address]    Special Requests   Final    NONE Performed at [facility], [place] [address] [place], [place] [address]    Gram Stain   Final    NO SQUAMOUS EPITHELIAL CELLS SEEN FEW WBC SEEN FEW GRAM POSITIVE COCCI    Culture   Final    CULTURE REINCUBATED FOR BETTER GROWTH Performed at [facility], [place] [address], [place],  [address]    Report Status PENDING  Incomplete         Radiology Studies: CT Angio Chest PE W and/or Wo Contrast  Result Date: 04/10/2021 CLINICAL DATA:  High probability PE. Being currently treated for abscess diverticulitis. Tachycardia. EXAM: CT ANGIOGRAPHY CHEST CT ABDOMEN AND PELVIS WITH CONTRAST TECHNIQUE: Multidetector  CT imaging of the chest was performed using the standard protocol during bolus administration of intravenous contrast. Multiplanar CT image reconstructions and MIPs were obtained to evaluate the vascular anatomy. Multidetector CT imaging of the abdomen and pelvis was performed using the standard protocol during bolus administration of intravenous contrast. CONTRAST:  38mL OMNIPAQUE IOHEXOL 350 MG/ML SOLN COMPARISON:  CT abdomen and pelvis 04/01/2021. CT abdomen and pelvis 11/27/2020. FINDINGS: CTA CHEST FINDINGS Cardiovascular: Satisfactory opacification of the pulmonary arteries to the segmental level. No evidence of pulmonary embolism. Normal heart size. No pericardial effusion. Mediastinum/Nodes: No enlarged mediastinal, hilar, or axillary lymph nodes. Thyroid gland, trachea, and esophagus demonstrate no significant findings. Lungs/Pleura: Lungs are clear. No pleural effusion or pneumothorax. Musculoskeletal: No chest wall abnormality. No acute or significant osseous findings. Review of the MIP images confirms the above findings. CT ABDOMEN and PELVIS FINDINGS Hepatobiliary: There is diffuse fatty infiltration of the liver. Gallstones are present. There is no biliary ductal dilatation. Pancreas: Unremarkable. No pancreatic ductal dilatation or surrounding inflammatory changes. Spleen:  Normal in size without focal abnormality. Adrenals/Urinary Tract: There is a 3.5 x 2.8 cm stable left adrenal nodule previously characterized as adenoma. Right adrenal gland is within normal limits. There is a cyst in the inferior pole the left kidney measuring 3 cm, unchanged. The kidneys otherwise appear within normal limits. The bladder is within normal limits. Stomach/Bowel: There is no evidence for bowel obstruction. There is marked focal wall thickening of the mid descending colon with surrounding inflammatory stranding. This has slightly increased when compared to the prior study. There is a small amount of extraluminal gas compatible with perforation. Gas has increased when compared to the prior examination. Stomach is within normal limits. Vascular/Lymphatic: No significant vascular findings are present. Aorta and IVC are normal in size. There are prominent and enlarged central mesenteric lymph nodes which are new from the prior examination. The largest lymph node measures 1.5 by 2.4 cm image 4/49. Reproductive: Again seen is a hypodense lesion in the fundus of the uterus containing some calcifications measuring 4.7 cm similar to the prior study. Adnexa are within normal limits. Other: Previously identified percutaneous drainage catheter in the anterior left abdomen has been pulled back in the interval. The distal catheter tip is in the deep subcutaneous soft tissues of the anterior abdominal wall. Previously identified left lateral percutaneous drainage catheter is unchanged in position. The distal portion of the catheter is in a new lobulated enhancing fluid collection containing air abutting the inflamed portion of the colon. This collection measures 8.5 x 3.5 x 10.0 cm and is now within the adjacent iliopsoas musculature and abutting the left iliacus musculature. This fluid collection also extends through the left lateral abdominal wall on image 4/51, a new finding. There is trace free fluid in the  pelvis. There is no focal abdominal wall hernia. Musculoskeletal: No acute or significant osseous findings. Review of the MIP images confirms the above findings. IMPRESSION: 1. Again seen are findings compatible with descending colon diverticulitis with perforation. Free air and inflammation have increased in the interval. 2. New lobulated enhancing fluid collection posterior to this segment of inflamed colon measuring 8.5 x 3.5 x 10.0 cm. This collection now invades the adjacent iliopsoas muscle as well as extends through the left lateral abdominal wall. The tip of the drainage catheter is in this collection. Findings are compatible with abscess. 3. Anterior left percutaneous drainage catheter tip has been pulled back and is now within the subcutaneous tissues. 4. Trace free  fluid. 5. No pulmonary embolism.  No acute cardiopulmonary process. 6. Cholelithiasis. 7. Fatty infiltration of the liver. Electronically Signed   By: Ronney Asters M.D.   On: 04/10/2021 19:01   CT ABDOMEN PELVIS W CONTRAST  Result Date: 04/10/2021 CLINICAL DATA:  High probability PE. Being currently treated for abscess diverticulitis. Tachycardia. EXAM: CT ANGIOGRAPHY CHEST CT ABDOMEN AND PELVIS WITH CONTRAST TECHNIQUE: Multidetector CT imaging of the chest was performed using the standard protocol during bolus administration of intravenous contrast. Multiplanar CT image reconstructions and MIPs were obtained to evaluate the vascular anatomy. Multidetector CT imaging of the abdomen and pelvis was performed using the standard protocol during bolus administration of intravenous contrast. CONTRAST:  30mL OMNIPAQUE IOHEXOL 350 MG/ML SOLN COMPARISON:  CT abdomen and pelvis 04/01/2021. CT abdomen and pelvis 11/27/2020. FINDINGS: CTA CHEST FINDINGS Cardiovascular: Satisfactory opacification of the pulmonary arteries to the segmental level. No evidence of pulmonary embolism. Normal heart size. No pericardial effusion. Mediastinum/Nodes: No  enlarged mediastinal, hilar, or axillary lymph nodes. Thyroid gland, trachea, and esophagus demonstrate no significant findings. Lungs/Pleura: Lungs are clear. No pleural effusion or pneumothorax. Musculoskeletal: No chest wall abnormality. No acute or significant osseous findings. Review of the MIP images confirms the above findings. CT ABDOMEN and PELVIS FINDINGS Hepatobiliary: There is diffuse fatty infiltration of the liver. Gallstones are present. There is no biliary ductal dilatation. Pancreas: Unremarkable. No pancreatic ductal dilatation or surrounding inflammatory changes. Spleen: Normal in size without focal abnormality. Adrenals/Urinary Tract: There is a 3.5 x 2.8 cm stable left adrenal nodule previously characterized as adenoma. Right adrenal gland is within normal limits. There is a cyst in the inferior pole the left kidney measuring 3 cm, unchanged. The kidneys otherwise appear within normal limits. The bladder is within normal limits. Stomach/Bowel: There is no evidence for bowel obstruction. There is marked focal wall thickening of the mid descending colon with surrounding inflammatory stranding. This has slightly increased when compared to the prior study. There is a small amount of extraluminal gas compatible with perforation. Gas has increased when compared to the prior examination. Stomach is within normal limits. Vascular/Lymphatic: No significant vascular findings are present. Aorta and IVC are normal in size. There are prominent and enlarged central mesenteric lymph nodes which are new from the prior examination. The largest lymph node measures 1.5 by 2.4 cm image 4/49. Reproductive: Again seen is a hypodense lesion in the fundus of the uterus containing some calcifications measuring 4.7 cm similar to the prior study. Adnexa are within normal limits. Other: Previously identified percutaneous drainage catheter in the anterior left abdomen has been pulled back in the interval. The distal  catheter tip is in the deep subcutaneous soft tissues of the anterior abdominal wall. Previously identified left lateral percutaneous drainage catheter is unchanged in position. The distal portion of the catheter is in a new lobulated enhancing fluid collection containing air abutting the inflamed portion of the colon. This collection measures 8.5 x 3.5 x 10.0 cm and is now within the adjacent iliopsoas musculature and abutting the left iliacus musculature. This fluid collection also extends through the left lateral abdominal wall on image 4/51, a new finding. There is trace free fluid in the pelvis. There is no focal abdominal wall hernia. Musculoskeletal: No acute or significant osseous findings. Review of the MIP images confirms the above findings. IMPRESSION: 1. Again seen are findings compatible with descending colon diverticulitis with perforation. Free air and inflammation have increased in the interval. 2. New lobulated enhancing fluid collection  posterior to this segment of inflamed colon measuring 8.5 x 3.5 x 10.0 cm. This collection now invades the adjacent iliopsoas muscle as well as extends through the left lateral abdominal wall. The tip of the drainage catheter is in this collection. Findings are compatible with abscess. 3. Anterior left percutaneous drainage catheter tip has been pulled back and is now within the subcutaneous tissues. 4. Trace free fluid. 5. No pulmonary embolism.  No acute cardiopulmonary process. 6. Cholelithiasis. 7. Fatty infiltration of the liver. Electronically Signed   By: Ronney Asters M.D.   On: 04/10/2021 19:01   DG Chest Portable 1 View  Result Date: 04/10/2021 CLINICAL DATA:  Weakness. EXAM: PORTABLE CHEST 1 VIEW COMPARISON:  March 31, 2016 FINDINGS: The heart size and mediastinal contours are within normal limits. Both lungs are clear. The visualized skeletal structures are unremarkable. IMPRESSION: No active disease. Electronically Signed   By: Virgina Norfolk  M.D.   On: 04/10/2021 15:51        Scheduled Meds:  amLODipine  10 mg Oral QPM   lisinopril  20 mg Oral Daily   metoprolol succinate  25 mg Oral BID   sodium chloride flush  10 mL Intracatheter Q12H   Continuous Infusions:  cefTRIAXone (ROCEPHIN)  IV Stopped (04/10/21 2230)   lactated ringers 75 mL/hr at 04/11/21 1501   metronidazole 500 mg (04/11/21 1021)     LOS: 1 day        Hosie Poisson, MD [facility]   To contact the attending provider between 7A-7P or the covering provider during after hours 7P-7A, please log into the web site [email or web address] and access using universal [place] password for that web site. If you do not have the password, please call the hospital operator.  04/11/2021, 4:04 PM

## 2021-04-11 NOTE — ED Notes (Signed)
Patient cleaned up and new barrier cream applied.

## 2021-04-11 NOTE — Progress Notes (Signed)
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 Now in the ED with syncope/weakness. IR asked to evaluate drains.   Subjective: Patient in bed, awake and alert and states she feels much better after receiving fluids in the ED. She denies any abdominal pain/discomfort.   Allergies: Shellfish allergy and Penicillins  Medications: Prior to Admission medications   Medication Sig Start Date End Date Taking? Authorizing Provider  acetaminophen (TYLENOL) 500 MG tablet Take 1,000 mg by mouth every 6 (six) hours as needed for moderate pain or headache.   Yes [provider]  amLODipine (NORVASC) 10 MG tablet TAKE 1 TABLET BY MOUTH EVERY DAY Patient taking differently: Take 10 mg by mouth every evening. 01/19/21  Yes [name], MD  dicyclomine (BENTYL) 10 MG capsule Take 1 capsule (10 mg total) by mouth 3 (three) times daily as needed for spasms. 03/12/21  Yes Sharyn Creamer, MD  lisinopril-hydrochlorothiazide (ZESTORETIC) 20-12.5 MG tablet TAKE 2 TABLETS BY MOUTH EVERY DAY Patient taking differently: Take 2 tablets by mouth daily. 12/10/20  Yes [name], MD  metoprolol succinate (TOPROL-XL) 25 MG 24 hr tablet TAKE 1 TABLET BY MOUTH TWICE A DAY Patient taking differently: Take 25 mg by mouth 2 (two) times daily. 04/10/21  Yes Ghumman, Milford Cage, NP  Prenatal Vit-Fe Fumarate-FA (PRENATAL MULTIVITAMIN) TABS tablet Take 1 tablet by mouth daily at 12 noon.   Yes [provider]  Simethicone (GAS-X PO) Take 1 tablet by mouth daily as needed (gas).   Yes [provider]  traMADol (ULTRAM) 50 MG tablet Take 50 mg by mouth every 6 (six) hours as needed for moderate pain. 12/29/20  Yes [provider]  benzonatate (TESSALON PERLES) 100 MG capsule Take 1 capsule (100 mg  total) by mouth every 6 (six) hours as needed. Patient not taking: Reported on 04/10/2021 10/06/20 10/06/21  Minette Brine, FNP  Sodium Chloride Flush (NORMAL SALINE FLUSH) 0.9 % SOLN Flush as directed Patient not taking: Reported on 04/10/2021 11/11/20   Sandi Mariscal, MD  sodium chloride flush (NS) 0.9 % SOLN 5 mLs by Intracatheter route every 8 (eight) hours. Patient not taking: Reported on 04/10/2021 11/11/20   Norm Parcel, PA-C     Vital Signs: BP 109/75 (BP Location: Left Arm)   Pulse (!) 116   Temp 98 F (36.7 C) (Oral)   Resp 16   Ht 5\' 3"  (1.6 m)   Wt 200 lb (90.7 kg)   LMP 04/10/2021 (Approximate)   SpO2 100%   BMI 35.43 kg/m   Physical Exam Constitutional:      General: She is not in acute distress.    Appearance: She is obese. She is not ill-appearing.  Pulmonary:     Effort: Pulmonary effort is normal.  Abdominal:     Palpations: Abdomen is soft.     Tenderness: There is no abdominal tenderness.     Comments: Two left abdominal abscess drains to gravity. Both bags are empty. Drain sites are dry, no drainage or erythema observed. Stat locks in place.   Skin:    General: Skin is warm and dry.  Neurological:     Mental Status: She is alert and oriented to person, place, and time.    Imaging: CT Angio Chest PE W and/or Wo Contrast  Result Date: 04/10/2021 CLINICAL DATA:  High probability PE. Being currently treated for abscess diverticulitis. Tachycardia. EXAM: CT ANGIOGRAPHY CHEST CT ABDOMEN AND PELVIS WITH CONTRAST TECHNIQUE: Multidetector CT imaging of the chest was performed using the standard protocol during bolus administration of intravenous contrast. Multiplanar CT image reconstructions and MIPs were obtained to evaluate the vascular anatomy. Multidetector CT imaging of the abdomen and pelvis was performed using the standard protocol during bolus administration of intravenous contrast. CONTRAST:  58mL OMNIPAQUE IOHEXOL 350 MG/ML SOLN COMPARISON:  CT abdomen and  pelvis 04/01/2021. CT abdomen and pelvis 11/27/2020. FINDINGS: CTA CHEST FINDINGS Cardiovascular: Satisfactory opacification of the pulmonary arteries to the segmental level. No evidence of pulmonary embolism. Normal heart size. No pericardial effusion. Mediastinum/Nodes: No enlarged mediastinal, hilar, or axillary lymph nodes. Thyroid gland, trachea, and esophagus demonstrate no significant findings. Lungs/Pleura: Lungs are clear. No pleural effusion or pneumothorax. Musculoskeletal: No chest wall abnormality. No acute or significant osseous findings. Review of the MIP images confirms the above findings. CT ABDOMEN and PELVIS FINDINGS Hepatobiliary: There is diffuse fatty infiltration of the liver. Gallstones are present. There is no biliary ductal dilatation. Pancreas: Unremarkable. No pancreatic ductal dilatation or surrounding inflammatory changes. Spleen: Normal in size without focal abnormality. Adrenals/Urinary Tract: There is a 3.5 x 2.8 cm stable left adrenal nodule previously characterized as adenoma. Right adrenal gland is within normal limits. There is a cyst in the inferior pole the left kidney measuring 3 cm, unchanged. The kidneys otherwise appear within normal limits. The bladder is within normal limits. Stomach/Bowel: There is no evidence for bowel obstruction. There is marked focal wall thickening of the mid descending colon with surrounding inflammatory stranding. This has slightly increased when compared to the prior study. There is a small amount of extraluminal gas compatible with perforation. Gas has increased when compared to the prior examination. Stomach is within normal limits. Vascular/Lymphatic: No significant vascular findings are present. Aorta and IVC are normal in size. There are prominent and enlarged central mesenteric lymph nodes which are new from the prior examination. The largest lymph node measures 1.5 by 2.4 cm image 4/49. Reproductive: Again seen is a hypodense lesion in the  fundus of the uterus containing some calcifications measuring 4.7 cm similar to the prior study. Adnexa are within normal limits. Other: Previously identified percutaneous drainage catheter in the anterior left abdomen has been pulled back in the interval. The distal catheter tip is in the deep subcutaneous soft tissues of the anterior abdominal wall. Previously identified left lateral percutaneous drainage catheter is unchanged in position. The distal portion of the catheter is in a new lobulated enhancing fluid collection containing air abutting the inflamed portion of the colon. This collection measures 8.5 x 3.5 x 10.0 cm and is now within the adjacent iliopsoas musculature and abutting the left iliacus musculature. This fluid collection also extends through the left lateral abdominal wall on image 4/51, a new finding. There is trace free fluid in the pelvis. There is no focal abdominal wall hernia. Musculoskeletal: No acute or significant osseous findings. Review of the MIP images confirms the above findings. IMPRESSION: 1. Again seen are findings compatible with descending colon diverticulitis with perforation. Free air and inflammation have increased in the interval. 2. New lobulated enhancing fluid collection posterior to this segment of inflamed colon measuring 8.5 x 3.5 x 10.0 cm. This collection now invades the adjacent iliopsoas muscle as well as extends through the left lateral abdominal wall. The tip of the drainage catheter is in this collection. Findings are compatible with  abscess. 3. Anterior left percutaneous drainage catheter tip has been pulled back and is now within the subcutaneous tissues. 4. Trace free fluid. 5. No pulmonary embolism.  No acute cardiopulmonary process. 6. Cholelithiasis. 7. Fatty infiltration of the liver. Electronically Signed   By: Ronney Asters M.D.   On: 04/10/2021 19:01   CT ABDOMEN PELVIS W CONTRAST  Result Date: 04/10/2021 CLINICAL DATA:  High probability PE.  Being currently treated for abscess diverticulitis. Tachycardia. EXAM: CT ANGIOGRAPHY CHEST CT ABDOMEN AND PELVIS WITH CONTRAST TECHNIQUE: Multidetector CT imaging of the chest was performed using the standard protocol during bolus administration of intravenous contrast. Multiplanar CT image reconstructions and MIPs were obtained to evaluate the vascular anatomy. Multidetector CT imaging of the abdomen and pelvis was performed using the standard protocol during bolus administration of intravenous contrast. CONTRAST:  18mL OMNIPAQUE IOHEXOL 350 MG/ML SOLN COMPARISON:  CT abdomen and pelvis 04/01/2021. CT abdomen and pelvis 11/27/2020. FINDINGS: CTA CHEST FINDINGS Cardiovascular: Satisfactory opacification of the pulmonary arteries to the segmental level. No evidence of pulmonary embolism. Normal heart size. No pericardial effusion. Mediastinum/Nodes: No enlarged mediastinal, hilar, or axillary lymph nodes. Thyroid gland, trachea, and esophagus demonstrate no significant findings. Lungs/Pleura: Lungs are clear. No pleural effusion or pneumothorax. Musculoskeletal: No chest wall abnormality. No acute or significant osseous findings. Review of the MIP images confirms the above findings. CT ABDOMEN and PELVIS FINDINGS Hepatobiliary: There is diffuse fatty infiltration of the liver. Gallstones are present. There is no biliary ductal dilatation. Pancreas: Unremarkable. No pancreatic ductal dilatation or surrounding inflammatory changes. Spleen: Normal in size without focal abnormality. Adrenals/Urinary Tract: There is a 3.5 x 2.8 cm stable left adrenal nodule previously characterized as adenoma. Right adrenal gland is within normal limits. There is a cyst in the inferior pole the left kidney measuring 3 cm, unchanged. The kidneys otherwise appear within normal limits. The bladder is within normal limits. Stomach/Bowel: There is no evidence for bowel obstruction. There is marked focal wall thickening of the mid descending  colon with surrounding inflammatory stranding. This has slightly increased when compared to the prior study. There is a small amount of extraluminal gas compatible with perforation. Gas has increased when compared to the prior examination. Stomach is within normal limits. Vascular/Lymphatic: No significant vascular findings are present. Aorta and IVC are normal in size. There are prominent and enlarged central mesenteric lymph nodes which are new from the prior examination. The largest lymph node measures 1.5 by 2.4 cm image 4/49. Reproductive: Again seen is a hypodense lesion in the fundus of the uterus containing some calcifications measuring 4.7 cm similar to the prior study. Adnexa are within normal limits. Other: Previously identified percutaneous drainage catheter in the anterior left abdomen has been pulled back in the interval. The distal catheter tip is in the deep subcutaneous soft tissues of the anterior abdominal wall. Previously identified left lateral percutaneous drainage catheter is unchanged in position. The distal portion of the catheter is in a new lobulated enhancing fluid collection containing air abutting the inflamed portion of the colon. This collection measures 8.5 x 3.5 x 10.0 cm and is now within the adjacent iliopsoas musculature and abutting the left iliacus musculature. This fluid collection also extends through the left lateral abdominal wall on image 4/51, a new finding. There is trace free fluid in the pelvis. There is no focal abdominal wall hernia. Musculoskeletal: No acute or significant osseous findings. Review of the MIP images confirms the above findings. IMPRESSION: 1. Again seen are findings  compatible with descending colon diverticulitis with perforation. Free air and inflammation have increased in the interval. 2. New lobulated enhancing fluid collection posterior to this segment of inflamed colon measuring 8.5 x 3.5 x 10.0 cm. This collection now invades the adjacent  iliopsoas muscle as well as extends through the left lateral abdominal wall. The tip of the drainage catheter is in this collection. Findings are compatible with abscess. 3. Anterior left percutaneous drainage catheter tip has been pulled back and is now within the subcutaneous tissues. 4. Trace free fluid. 5. No pulmonary embolism.  No acute cardiopulmonary process. 6. Cholelithiasis. 7. Fatty infiltration of the liver. Electronically Signed   By: Ronney Asters M.D.   On: 04/10/2021 19:01   DG Chest Portable 1 View  Result Date: 04/10/2021 CLINICAL DATA:  Weakness. EXAM: PORTABLE CHEST 1 VIEW COMPARISON:  March 31, 2016 FINDINGS: The heart size and mediastinal contours are within normal limits. Both lungs are clear. The visualized skeletal structures are unremarkable. IMPRESSION: No active disease. Electronically Signed   By: Virgina Norfolk M.D.   On: 04/10/2021 15:51    Labs:  CBC: Recent Labs    11/09/20 0356 11/10/20 0751 04/10/21 1446 04/11/21 0302  WBC 17.2* 12.1* 14.1* 15.4*  HGB 8.2* 8.1* 8.4* 8.4*  HCT 27.4* 27.3* 27.3* 26.8*  PLT 675* 588* 777* 696*    COAGS: Recent Labs    11/07/20 1820 04/11/21 0302  INR 1.3* 1.2  APTT  --  28    BMP: Recent Labs    11/08/20 0421 11/09/20 0356 04/01/21 1642 04/10/21 1446 04/11/21 0302  NA 135 136  --  135 135  K 4.8 4.2  --  2.7* 4.3  CL 102 100  --  97* 100  CO2 26 26  --  27 25  GLUCOSE 99 103*  --  84 82  BUN 6 6  --  17 16  CALCIUM 8.3* 8.8*  --  7.5* 7.6*  CREATININE 0.71 0.47 <0.20* 0.46 0.49  GFRNONAA >60 >60  --  >60 >60    LIVER FUNCTION TESTS: Recent Labs    11/04/20 1119 04/10/21 1446 04/11/21 0302  BILITOT 0.4 0.6 0.7  AST 15 16 16   ALT 12 19 19   ALKPHOS 66 143* 147*  PROT 7.9 6.2* 6.2*  ALBUMIN 2.5* 1.6* 1.7*    Assessment and Plan:  Complicated diverticulitis with abscess s/p drain placements in IR x 2 11/07/20 with 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   CT imaging  today shows the anterior drain has been pulled back and is in the subcutaneous tissue. This drain was removed at the bedside.   The left lateral drain is within a large collection measuring 8.5 x 3.5 x 10 cm. The patient reports no output from the drain for a while. The drain was easily flushed and aspirated with the return of purulent, foul smelling material. The drain was connected to a JP bulb.   Orders placed to flush the drain and document drain output each shift. Change the dressing daily or as needed.   We will tentatively plan to bring the patient down to IR on Monday for a drain exchange/upsize.   Other plans per primary teams. IR to follow.   Electronically Signed: Soyla Dryer, AGACNP-BC [phone] 04/11/2021, 2:47 PM   I spent a total of 25 Minutes at the the patient's bedside AND on the patient's hospital floor or unit, greater than 50% of which was counseling/coordinating care for diverticular abscess drains.

## 2021-04-11 NOTE — Consult Note (Addendum)
Reason for Consult: diverticulitis with abscess  Referring Physician: Dr. Inda Merlin, [facility]  Mikayla Rios is an 35 y.o. female.  HPI: Patient is a 35 year old female known to the general surgery service and to our colorectal surgeon, Dr. Leighton Ruff, for complicated diverticulitis with abscess formation.  Patient initially presented in June 2022.  She underwent percutaneous drainage of a diverticular abscess.  Patient has been followed by interventional radiology and by Dr. Marcello Moores.  She has had continuous drainage.  Patient was seen in the office at [facility] surgery in late September.  At that time the patient expressed a desire to avoid surgery and continue with management with percutaneous drainage and antibiotics.  Patient was referred to gastroenterology, Dr. Lorenso Courier, for colonoscopy to rule out malignancy.  Patient now presents to the emergency department.  CT scan of the abdomen and pelvis demonstrates a persistent large abscess which now involves the lateral abdominal wall.  Percutaneous drain remains in position.  The second drain is now in the subcutaneous tissues.  Laboratory studies show an elevated white blood cell count of 14.1.  Patient is being admitted to the medical service for management.  General surgery is asked to evaluate for further recommendations regarding complicated diverticulitis with failure to improve with current management.  Past Medical History:  Diagnosis Date   Allergy    seasonal   Anemia    Blood infection 1985   Blood transfusion without reported diagnosis    Diverticulitis    Hypertension    Obesity    Sleep apnea     Past Surgical History:  Procedure Laterality Date   IR CATHETER TUBE CHANGE  12/12/2020   IR CATHETER TUBE CHANGE  01/27/2021   IR CATHETER TUBE CHANGE  02/19/2021   IR RADIOLOGIST EVAL & MGMT  11/27/2020   IR RADIOLOGIST EVAL & MGMT  12/11/2020   IR RADIOLOGIST EVAL & MGMT  01/07/2021   IR  RADIOLOGIST EVAL & MGMT  02/04/2021   IR SINUS/FIST TUBE CHK-NON GI  12/12/2020   IR SINUS/FIST TUBE CHK-NON GI  02/19/2021    Family History  Problem Relation Age of Onset   Cancer Mother        breast, and in remission   Diabetes Mother    Hypertension Mother    Colon cancer Mother        dx at age 54   Congestive Heart Failure Father    Hypertension Father    Diabetes Maternal Grandmother    Hypertension Maternal Grandmother    Diabetes Maternal Grandfather    Kidney disease Maternal Grandfather    Diabetes Paternal Grandmother    Cancer Paternal Grandfather        colon   Autism Son    Heart disease Paternal Aunt    Cancer Paternal Uncle        colon    Social History:  reports that she has never smoked. She has never used smokeless tobacco. She reports that she does not currently use alcohol. She reports that she does not use drugs.  Allergies:  Allergies  Allergen Reactions   Shellfish Allergy Hives   Penicillins Hives and Rash    Has patient had a PCN reaction causing immediate rash, facial/tongue/throat swelling, SOB or lightheadedness with hypotension: No Has patient had a PCN reaction causing severe rash involving mucus membranes or skin necrosis: hives Has patient had a PCN reaction that required hospitalization No Has patient had a PCN reaction occurring within  the last 10 years: yes If all of the above answers are "NO", then may proceed with Cephalosporin use.     Medications: I have reviewed the patient's current medications.  Results for orders placed or performed during the hospital encounter of 04/10/21 (from the past 48 hour(s))  CBC with Differential     Status: Abnormal   Collection Time: 04/10/21  2:46 PM  Result Value Ref Range   WBC 14.1 (H) 4.0 - 10.5 K/uL   RBC 3.06 (L) 3.87 - 5.11 MIL/uL   Hemoglobin 8.4 (L) 12.0 - 15.0 g/dL   HCT 27.3 (L) 36.0 - 46.0 %   MCV 89.2 80.0 - 100.0 fL   MCH 27.5 26.0 - 34.0 pg   MCHC 30.8 30.0 - 36.0 g/dL    RDW 22.8 (H) 11.5 - 15.5 %   Platelets 777 (H) 150 - 400 K/uL   nRBC 0.0 0.0 - 0.2 %   Neutrophils Relative % 87 %   Neutro Abs 12.2 (H) 1.7 - 7.7 K/uL   Lymphocytes Relative 10 %   Lymphs Abs 1.4 0.7 - 4.0 K/uL   Monocytes Relative 2 %   Monocytes Absolute 0.3 0.1 - 1.0 K/uL   Eosinophils Relative 0 %   Eosinophils Absolute 0.0 0.0 - 0.5 K/uL   Basophils Relative 0 %   Basophils Absolute 0.0 0.0 - 0.1 K/uL   Immature Granulocytes 1 %   Abs Immature Granulocytes 0.14 (H) 0.00 - 0.07 K/uL   Abnormal Lymphocytes Present PRESENT    Polychromasia PRESENT    Target Cells PRESENT     Comment: Performed at [facility], [place] [address] [place], [place] [address]  Comprehensive metabolic panel     Status: Abnormal   Collection Time: 04/10/21  2:46 PM  Result Value Ref Range   Sodium 135 135 - 145 mmol/L   Potassium 2.7 (LL) 3.5 - 5.1 mmol/L    Comment: CRITICAL RESULT CALLED TO, READ BACK BY AND VERIFIED WITH:  GARRISON G @1806  BY BATTLET    Chloride 97 (L) 98 - 111 mmol/L   CO2 27 22 - 32 mmol/L   Glucose, Bld 84 70 - 99 mg/dL    Comment: Glucose reference range applies only to samples taken after fasting for at least 8 hours.   BUN 17 6 - 20 mg/dL   Creatinine, Ser 0.46 0.44 - 1.00 mg/dL   Calcium 7.5 (L) 8.9 - 10.3 mg/dL   Total Protein 6.2 (L) 6.5 - 8.1 g/dL   Albumin 1.6 (L) 3.5 - 5.0 g/dL   AST 16 15 - 41 U/L   ALT 19 0 - 44 U/L   Alkaline Phosphatase 143 (H) 38 - 126 U/L   Total Bilirubin 0.6 0.3 - 1.2 mg/dL   GFR, Estimated >60 >60 mL/min    Comment: (NOTE) Calculated using the CKD-EPI Creatinine Equation (2021)    Anion gap 11 5 - 15    Comment: Performed at [facility], [place] [address] [place], [place] [address]  Resp Panel by RT-PCR (Flu A&B, Covid) Nasopharyngeal Swab     Status: None   Collection Time: 04/10/21  2:46 PM   Specimen: Nasopharyngeal Swab; Nasopharyngeal(NP) swabs in vial transport medium  Result Value Ref Range    SARS Coronavirus 2 by RT PCR NEGATIVE NEGATIVE    Comment: (NOTE) SARS-CoV-2 target nucleic acids are NOT DETECTED.  The SARS-CoV-2 RNA is generally detectable in upper respiratory specimens during the acute phase of infection. The  lowest concentration of SARS-CoV-2 viral copies this assay can detect is 138 copies/mL. A negative result does not preclude SARS-Cov-2 infection and should not be used as the sole basis for treatment or other patient management decisions. A negative result may occur with  improper specimen collection/handling, submission of specimen other than nasopharyngeal swab, presence of viral mutation(s) within the areas targeted by this assay, and inadequate number of viral copies(<138 copies/mL). A negative result must be combined with clinical observations, patient history, and epidemiological information. The expected result is Negative.  Fact Sheet for Patients:  EntrepreneurPulse.com.au  Fact Sheet for Healthcare Providers:  IncredibleEmployment.be  This test is no t yet approved or cleared by the [place] FDA and  has been authorized for detection and/or diagnosis of SARS-CoV-2 by FDA under an Emergency Use Authorization (EUA). This EUA will remain  in effect (meaning this test can be used) for the duration of the COVID-19 declaration under Section 564(b)(1) of the Act, 21 U.S.C.section 360bbb-3(b)(1), unless the authorization is terminated  or revoked sooner.       Influenza A by PCR NEGATIVE NEGATIVE   Influenza B by PCR NEGATIVE NEGATIVE    Comment: (NOTE) The Xpert Xpress SARS-CoV-2/FLU/RSV plus assay is intended as an aid in the diagnosis of influenza from Nasopharyngeal swab specimens and should not be used as a sole basis for treatment. Nasal washings and aspirates are unacceptable for Xpert Xpress SARS-CoV-2/FLU/RSV testing.  Fact Sheet for Patients: EntrepreneurPulse.com.au  Fact  Sheet for Healthcare Providers: IncredibleEmployment.be  This test is not yet approved or cleared by the [place] FDA and has been authorized for detection and/or diagnosis of SARS-CoV-2 by FDA under an Emergency Use Authorization (EUA). This EUA will remain in effect (meaning this test can be used) for the duration of the COVID-19 declaration under Section 564(b)(1) of the Act, 21 U.S.C. section 360bbb-3(b)(1), unless the authorization is terminated or revoked.  Performed at [facility], [place] [address] [place], [place] [address]   Lactic acid, plasma     Status: None   Collection Time: 04/10/21  3:25 PM  Result Value Ref Range   Lactic Acid, Venous 1.5 0.5 - 1.9 mmol/L    Comment: Performed at [facility], [place] [address] [place], [place] [address]  Troponin I (High Sensitivity)     Status: None   Collection Time: 04/10/21  3:31 PM  Result Value Ref Range   Troponin I (High Sensitivity) 5 <18 ng/L    Comment: (NOTE) Elevated high sensitivity troponin I (hsTnI) values and significant  changes across serial measurements may suggest ACS but many other  chronic and acute conditions are known to elevate hsTnI results.  Refer to the "Links" section for chest pain algorithms and additional  guidance. Performed at [facility], [place] [address] [place], [place] [address]   hCG, quantitative, pregnancy     Status: None   Collection Time: 04/10/21  5:07 PM  Result Value Ref Range   hCG, Beta Chain, Quant, S <1 <5 mIU/mL    Comment:          GEST. AGE      CONC.  (mIU/mL)   <=1 WEEK        5 - 50     2 WEEKS       50 - 500     3 WEEKS       100 - 10,000     4 WEEKS     1,000 -  30,000     5 WEEKS     3,500 - 115,000   6-8 WEEKS     12,000 - 270,000    12 WEEKS     15,000 - 220,000        FEMALE AND NON-PREGNANT FEMALE:     LESS THAN 5 mIU/mL Performed at [facility], [place]  [address] [place], [place] [address]   Magnesium     Status: None   Collection Time: 04/10/21  5:07 PM  Result Value Ref Range   Magnesium 1.9 1.7 - 2.4 mg/dL    Comment: Performed at [facility], [place] [address] [place], [place] [address]  Aerobic/Anaerobic Culture w Gram Stain (surgical/deep wound)     Status: None (Preliminary result)   Collection Time: 04/10/21  7:56 PM   Specimen: Abscess  Result Value Ref Range   Specimen Description      ABSCESS Performed at [place] [address] [place] [address] [place], [place] [address]    Special Requests      NONE Performed at [facility], [place] [address] [place], [place] [address]    Gram Stain      NO SQUAMOUS EPITHELIAL CELLS SEEN FEW WBC SEEN FEW GRAM POSITIVE COCCI Performed at [facility], [place] [address] [place], [place] [address]    Culture PENDING    Report Status PENDING   Urinalysis, Routine w reflex microscopic Urine, Clean Catch     Status: Abnormal   Collection Time: 04/11/21 12:32 AM  Result Value Ref Range   Color, Urine YELLOW YELLOW   APPearance CLOUDY (A) CLEAR   Specific Gravity, Urine <=1.005 1.005 - 1.030   pH 6.0 5.0 - 8.0   Glucose, UA NEGATIVE NEGATIVE mg/dL   Hgb urine dipstick SMALL (A) NEGATIVE   Bilirubin Urine NEGATIVE NEGATIVE   Ketones, ur 5 (A) NEGATIVE mg/dL   Protein, ur 30 (A) NEGATIVE mg/dL   Nitrite POSITIVE (A) NEGATIVE   Leukocytes,Ua LARGE (A) NEGATIVE   RBC / HPF 21-50 0 - 5 RBC/hpf   WBC, UA >50 (H) 0 - 5 WBC/hpf   Bacteria, UA MANY (A) NONE SEEN   Squamous Epithelial / LPF 6-10 0 - 5   Non Squamous Epithelial 0-5 (A) NONE SEEN    Comment: Performed at [facility], [place] [address], [place],  [address]  Comprehensive metabolic panel     Status: Abnormal   Collection Time: 04/11/21  3:02 AM  Result Value Ref Range   Sodium 135 135 - 145 mmol/L   Potassium 4.3 3.5 - 5.1 mmol/L    Comment: DELTA CHECK  NOTED   Chloride 100 98 - 111 mmol/L   CO2 25 22 - 32 mmol/L   Glucose, Bld 82 70 - 99 mg/dL    Comment: Glucose reference range applies only to samples taken after fasting for at least 8 hours.   BUN 16 6 - 20 mg/dL   Creatinine, Ser 0.49 0.44 - 1.00 mg/dL   Calcium 7.6 (L) 8.9 - 10.3 mg/dL   Total Protein 6.2 (L) 6.5 - 8.1 g/dL   Albumin 1.7 (L) 3.5 - 5.0 g/dL   AST 16 15 - 41 U/L   ALT 19 0 - 44 U/L   Alkaline Phosphatase 147 (H) 38 - 126 U/L   Total Bilirubin 0.7 0.3 - 1.2 mg/dL   GFR, Estimated >60 >60 mL/min    Comment: (NOTE) Calculated using the CKD-EPI Creatinine  Equation (2021)    Anion gap 10 5 - 15    Comment: Performed at [facility], [place] [address], [place], [place] [address]  Magnesium     Status: None   Collection Time: 04/11/21  3:02 AM  Result Value Ref Range   Magnesium 1.8 1.7 - 2.4 mg/dL    Comment: Performed at [facility], [place] [address], [place], [place] [address]  CBC WITH DIFFERENTIAL     Status: Abnormal   Collection Time: 04/11/21  3:02 AM  Result Value Ref Range   WBC 15.4 (H) 4.0 - 10.5 K/uL   RBC 3.05 (L) 3.87 - 5.11 MIL/uL   Hemoglobin 8.4 (L) 12.0 - 15.0 g/dL   HCT 26.8 (L) 36.0 - 46.0 %   MCV 87.9 80.0 - 100.0 fL   MCH 27.5 26.0 - 34.0 pg   MCHC 31.3 30.0 - 36.0 g/dL   RDW 23.3 (H) 11.5 - 15.5 %   Platelets 696 (H) 150 - 400 K/uL   nRBC 0.1 0.0 - 0.2 %   Neutrophils Relative % 89 %   Band Neutrophils 1 %   Lymphocytes Relative 7 %   Monocytes Relative 3 %   Eosinophils Relative 0 %   Basophils Relative 0 %   WBC Morphology NORMAL    RBC Morphology NORMAL    Smear Review DONE    nRBC 1 (H) 0 /100 WBC   Metamyelocytes Relative NONE SEEN %   Myelocytes NONE SEEN %   Promyelocytes Relative NONE SEEN %   Blasts NONE SEEN %    Comment: Performed at [facility], [place] [address], [place], [place] [address]  APTT     Status: None   Collection Time: 04/11/21  3:02 AM  Result Value  Ref Range   aPTT 28 24 - 36 seconds    Comment: Performed at [facility], [place] [address], [place], [place] [address]  Protime-INR     Status: None   Collection Time: 04/11/21  3:02 AM  Result Value Ref Range   Prothrombin Time 15.1 11.4 - 15.2 seconds   INR 1.2 0.8 - 1.2    Comment: (NOTE) INR goal varies based on device and disease states. Performed at [facility], [place] [address] [place], [place] [address]     CT Angio Chest PE W and/or Wo Contrast  Result Date: 04/10/2021 CLINICAL DATA:  High probability PE. Being currently treated for abscess diverticulitis. Tachycardia. EXAM: CT ANGIOGRAPHY CHEST CT ABDOMEN AND PELVIS WITH CONTRAST TECHNIQUE: Multidetector CT imaging of the chest was performed using the standard protocol during bolus administration of intravenous contrast. Multiplanar CT image reconstructions and MIPs were obtained to evaluate the vascular anatomy. Multidetector CT imaging of the abdomen and pelvis was performed using the standard protocol during bolus administration of intravenous contrast. CONTRAST:  9mL OMNIPAQUE IOHEXOL 350 MG/ML SOLN COMPARISON:  CT abdomen and pelvis 04/01/2021. CT abdomen and pelvis 11/27/2020. FINDINGS: CTA CHEST FINDINGS Cardiovascular: Satisfactory opacification of the pulmonary arteries to the segmental level. No evidence of pulmonary embolism. Normal heart size. No pericardial effusion. Mediastinum/Nodes: No enlarged mediastinal, hilar, or axillary lymph nodes. Thyroid gland, trachea, and esophagus demonstrate no significant findings. Lungs/Pleura: Lungs are clear. No pleural effusion or pneumothorax. Musculoskeletal: No chest wall abnormality. No acute or significant osseous findings. Review of the MIP images confirms the above findings. CT ABDOMEN and PELVIS FINDINGS Hepatobiliary: There is diffuse fatty infiltration of the liver. Gallstones are  present. There is no biliary ductal dilatation. Pancreas:  Unremarkable. No pancreatic ductal dilatation or surrounding inflammatory changes. Spleen: Normal in size without focal abnormality. Adrenals/Urinary Tract: There is a 3.5 x 2.8 cm stable left adrenal nodule previously characterized as adenoma. Right adrenal gland is within normal limits. There is a cyst in the inferior pole the left kidney measuring 3 cm, unchanged. The kidneys otherwise appear within normal limits. The bladder is within normal limits. Stomach/Bowel: There is no evidence for bowel obstruction. There is marked focal wall thickening of the mid descending colon with surrounding inflammatory stranding. This has slightly increased when compared to the prior study. There is a small amount of extraluminal gas compatible with perforation. Gas has increased when compared to the prior examination. Stomach is within normal limits. Vascular/Lymphatic: No significant vascular findings are present. Aorta and IVC are normal in size. There are prominent and enlarged central mesenteric lymph nodes which are new from the prior examination. The largest lymph node measures 1.5 by 2.4 cm image 4/49. Reproductive: Again seen is a hypodense lesion in the fundus of the uterus containing some calcifications measuring 4.7 cm similar to the prior study. Adnexa are within normal limits. Other: Previously identified percutaneous drainage catheter in the anterior left abdomen has been pulled back in the interval. The distal catheter tip is in the deep subcutaneous soft tissues of the anterior abdominal wall. Previously identified left lateral percutaneous drainage catheter is unchanged in position. The distal portion of the catheter is in a new lobulated enhancing fluid collection containing air abutting the inflamed portion of the colon. This collection measures 8.5 x 3.5 x 10.0 cm and is now within the adjacent iliopsoas musculature and abutting the left iliacus musculature. This fluid collection also extends through the left  lateral abdominal wall on image 4/51, a new finding. There is trace free fluid in the pelvis. There is no focal abdominal wall hernia. Musculoskeletal: No acute or significant osseous findings. Review of the MIP images confirms the above findings. IMPRESSION: 1. Again seen are findings compatible with descending colon diverticulitis with perforation. Free air and inflammation have increased in the interval. 2. New lobulated enhancing fluid collection posterior to this segment of inflamed colon measuring 8.5 x 3.5 x 10.0 cm. This collection now invades the adjacent iliopsoas muscle as well as extends through the left lateral abdominal wall. The tip of the drainage catheter is in this collection. Findings are compatible with abscess. 3. Anterior left percutaneous drainage catheter tip has been pulled back and is now within the subcutaneous tissues. 4. Trace free fluid. 5. No pulmonary embolism.  No acute cardiopulmonary process. 6. Cholelithiasis. 7. Fatty infiltration of the liver. Electronically Signed   By: Ronney Asters M.D.   On: 04/10/2021 19:01   CT ABDOMEN PELVIS W CONTRAST  Result Date: 04/10/2021 CLINICAL DATA:  High probability PE. Being currently treated for abscess diverticulitis. Tachycardia. EXAM: CT ANGIOGRAPHY CHEST CT ABDOMEN AND PELVIS WITH CONTRAST TECHNIQUE: Multidetector CT imaging of the chest was performed using the standard protocol during bolus administration of intravenous contrast. Multiplanar CT image reconstructions and MIPs were obtained to evaluate the vascular anatomy. Multidetector CT imaging of the abdomen and pelvis was performed using the standard protocol during bolus administration of intravenous contrast. CONTRAST:  45mL OMNIPAQUE IOHEXOL 350 MG/ML SOLN COMPARISON:  CT abdomen and pelvis 04/01/2021. CT abdomen and pelvis 11/27/2020. FINDINGS: CTA CHEST FINDINGS Cardiovascular: Satisfactory opacification of the pulmonary arteries to the segmental level. No evidence of  pulmonary embolism.  Normal heart size. No pericardial effusion. Mediastinum/Nodes: No enlarged mediastinal, hilar, or axillary lymph nodes. Thyroid gland, trachea, and esophagus demonstrate no significant findings. Lungs/Pleura: Lungs are clear. No pleural effusion or pneumothorax. Musculoskeletal: No chest wall abnormality. No acute or significant osseous findings. Review of the MIP images confirms the above findings. CT ABDOMEN and PELVIS FINDINGS Hepatobiliary: There is diffuse fatty infiltration of the liver. Gallstones are present. There is no biliary ductal dilatation. Pancreas: Unremarkable. No pancreatic ductal dilatation or surrounding inflammatory changes. Spleen: Normal in size without focal abnormality. Adrenals/Urinary Tract: There is a 3.5 x 2.8 cm stable left adrenal nodule previously characterized as adenoma. Right adrenal gland is within normal limits. There is a cyst in the inferior pole the left kidney measuring 3 cm, unchanged. The kidneys otherwise appear within normal limits. The bladder is within normal limits. Stomach/Bowel: There is no evidence for bowel obstruction. There is marked focal wall thickening of the mid descending colon with surrounding inflammatory stranding. This has slightly increased when compared to the prior study. There is a small amount of extraluminal gas compatible with perforation. Gas has increased when compared to the prior examination. Stomach is within normal limits. Vascular/Lymphatic: No significant vascular findings are present. Aorta and IVC are normal in size. There are prominent and enlarged central mesenteric lymph nodes which are new from the prior examination. The largest lymph node measures 1.5 by 2.4 cm image 4/49. Reproductive: Again seen is a hypodense lesion in the fundus of the uterus containing some calcifications measuring 4.7 cm similar to the prior study. Adnexa are within normal limits. Other: Previously identified percutaneous drainage catheter  in the anterior left abdomen has been pulled back in the interval. The distal catheter tip is in the deep subcutaneous soft tissues of the anterior abdominal wall. Previously identified left lateral percutaneous drainage catheter is unchanged in position. The distal portion of the catheter is in a new lobulated enhancing fluid collection containing air abutting the inflamed portion of the colon. This collection measures 8.5 x 3.5 x 10.0 cm and is now within the adjacent iliopsoas musculature and abutting the left iliacus musculature. This fluid collection also extends through the left lateral abdominal wall on image 4/51, a new finding. There is trace free fluid in the pelvis. There is no focal abdominal wall hernia. Musculoskeletal: No acute or significant osseous findings. Review of the MIP images confirms the above findings. IMPRESSION: 1. Again seen are findings compatible with descending colon diverticulitis with perforation. Free air and inflammation have increased in the interval. 2. New lobulated enhancing fluid collection posterior to this segment of inflamed colon measuring 8.5 x 3.5 x 10.0 cm. This collection now invades the adjacent iliopsoas muscle as well as extends through the left lateral abdominal wall. The tip of the drainage catheter is in this collection. Findings are compatible with abscess. 3. Anterior left percutaneous drainage catheter tip has been pulled back and is now within the subcutaneous tissues. 4. Trace free fluid. 5. No pulmonary embolism.  No acute cardiopulmonary process. 6. Cholelithiasis. 7. Fatty infiltration of the liver. Electronically Signed   By: Ronney Asters M.D.   On: 04/10/2021 19:01   DG Chest Portable 1 View  Result Date: 04/10/2021 CLINICAL DATA:  Weakness. EXAM: PORTABLE CHEST 1 VIEW COMPARISON:  March 31, 2016 FINDINGS: The heart size and mediastinal contours are within normal limits. Both lungs are clear. The visualized skeletal structures are unremarkable.  IMPRESSION: No active disease. Electronically Signed   By: Virgina Norfolk  M.D.   On: 04/10/2021 15:51    Review of Systems  Constitutional:  Positive for fever.  HENT: Negative.    Eyes: Negative.   Respiratory: Negative.    Cardiovascular: Negative.   Gastrointestinal:  Positive for abdominal pain.  Endocrine: Negative.   Genitourinary: Negative.   Musculoskeletal: Negative.   Skin: Negative.   Allergic/Immunologic: Negative.   Neurological: Negative.   Hematological: Negative.   Psychiatric/Behavioral: Negative.     Physical Exam  Blood pressure 133/89, pulse (!) 112, temperature 98.8 F (37.1 C), temperature source Oral, resp. rate (!) 23, height 5\' 3"  (1.6 m), weight 90.7 kg, last menstrual period 04/10/2021, SpO2 100 %.  CONSTITUTIONAL: no acute distress; conversant; no obvious deformities  EYES: conjunctiva moist; no lid lag; anicteric; pupils equal bilaterally  NECK: trachea midline; no thyroid nodularity  LUNGS: respiratory effort normal & unlabored; no wheeze; no rales  CV: rate and rhythm regular; no palpable thrills; no murmur; no edema bilat lower extremities  GI: abdomen is morbidly obese, soft, with mild diffuse tenderness; there are 2 percutaneous drains in place, one in a left suprapubic location and the other in the left lower quadrant.  MSK: normal range of motion of extremities; no clubbing; no cyanosis  PSYCH: appropriate affect for situation; alert and oriented to person, place, & time  LYMPHATIC: no palpable cervical lymphadenopathy; no evidence lymphedema in extremities    Assessment/Plan: Complicated diverticulitis with abscess  Agree with admission to medical service  IV abx's  Keep NPO for possible IR procedure  Would consult gastroenterology, Dr. Lorenso Courier, as they have recently evaluated for possible colonoscopy to rule out malignancy.  I discussed the case with Dr. Inda Merlin.  I also discussed the case and reviewed the CT  scan with Dr. Aletta Edouard from interventional radiology.  Interventional radiology will evaluate the patient.  The drain in the subcutaneous tissues may be removed.  The drain in the abscess cavity may need attention or possible replacement during this hospitalization.  We will discussed the case with Dr. Leighton Ruff who has been following this patient since June 2022.  [name], MD [facility] A [place] practice Office: [phone]   [name] 04/11/2021, 8:29 AM

## 2021-04-11 NOTE — ED Notes (Signed)
Redressed patients dressings on her incisions. Clean, dry, intact. Posterior incision did have drainage, RN already sent down a culture.

## 2021-04-11 NOTE — ED Notes (Signed)
Pt placed on bed pan with call light in reach. Pt will call out when she is finished.

## 2021-04-11 NOTE — ED Notes (Addendum)
After multiple attempts of getting another set of blood cultures and morning labs, only 1 gold tube was able to be received and sent to the lab. RN called lab and they reported the gold tube will be a send out to [facility] tube.

## 2021-04-11 NOTE — ED Notes (Signed)
Patient has redness on right upper buttocks. Patient has redness and irritation in the upper thighs where her legs are rubbing together. Barrier cream applied.

## 2021-04-12 ENCOUNTER — Inpatient Hospital Stay: Payer: Self-pay

## 2021-04-12 ENCOUNTER — Inpatient Hospital Stay (HOSPITAL_COMMUNITY): Payer: BC Managed Care – PPO

## 2021-04-12 DIAGNOSIS — K5720 Diverticulitis of large intestine with perforation and abscess without bleeding: Secondary | ICD-10-CM | POA: Diagnosis not present

## 2021-04-12 DIAGNOSIS — E876 Hypokalemia: Secondary | ICD-10-CM | POA: Diagnosis not present

## 2021-04-12 DIAGNOSIS — R000 Tachycardia, unspecified: Secondary | ICD-10-CM | POA: Diagnosis not present

## 2021-04-12 DIAGNOSIS — I10 Essential (primary) hypertension: Secondary | ICD-10-CM | POA: Diagnosis not present

## 2021-04-12 LAB — BASIC METABOLIC PANEL
Anion gap: 7 (ref 5–15)
BUN: 12 mg/dL (ref 6–20)
CO2: 26 mmol/L (ref 22–32)
Calcium: 7.3 mg/dL — ABNORMAL LOW (ref 8.9–10.3)
Chloride: 103 mmol/L (ref 98–111)
Creatinine, Ser: 0.4 mg/dL — ABNORMAL LOW (ref 0.44–1.00)
GFR, Estimated: >60 mL/min (ref >60)
Glucose, Bld: 81 mg/dL (ref 70–99)
Potassium: 3.8 mmol/L (ref 3.5–5.1)
Sodium: 136 mmol/L (ref 135–145)

## 2021-04-12 LAB — CBC
HCT: 22.7 % — ABNORMAL LOW (ref 36.0–46.0)
Hemoglobin: 7.1 g/dL — ABNORMAL LOW (ref 12.0–15.0)
MCH: 28.1 pg (ref 26.0–34.0)
MCHC: 31.3 g/dL (ref 30.0–36.0)
MCV: 89.7 fL (ref 80.0–100.0)
Platelets: 433 10*3/uL — ABNORMAL HIGH (ref 150–400)
RBC: 2.53 MIL/uL — ABNORMAL LOW (ref 3.87–5.11)
RDW: 23.9 % — ABNORMAL HIGH (ref 11.5–15.5)
WBC: 8.8 10*3/uL (ref 4.0–10.5)
nRBC: 0 % (ref 0.0–0.2)

## 2021-04-12 LAB — HIV ANTIBODY (ROUTINE TESTING W REFLEX): HIV Screen 4th Generation wRfx: NONREACTIVE

## 2021-04-12 LAB — LACTIC ACID, PLASMA: Lactic Acid, Venous: 1.5 mmol/L (ref 0.5–1.9)

## 2021-04-12 LAB — PREPARE RBC (CROSSMATCH)

## 2021-04-12 MED ORDER — LACTATED RINGERS IV BOLUS
1000.0000 mL | Freq: Once | INTRAVENOUS | Status: AC
  Administered 2021-04-12: 1000 mL via INTRAVENOUS

## 2021-04-12 MED ORDER — SODIUM CHLORIDE 0.9% FLUSH
10.0000 mL | INTRAVENOUS | Status: DC | PRN
  Administered 2021-04-18 – 2021-04-21 (×2): 10 mL
  Administered 2021-04-27: 20 mL
  Administered 2021-04-30: 10 mL

## 2021-04-12 MED ORDER — CHLORHEXIDINE GLUCONATE CLOTH 2 % EX PADS
6.0000 | MEDICATED_PAD | Freq: Every day | CUTANEOUS | Status: DC
  Administered 2021-04-12 – 2021-04-30 (×18): 6 via TOPICAL

## 2021-04-12 MED ORDER — SODIUM CHLORIDE 0.9% FLUSH
10.0000 mL | Freq: Two times a day (BID) | INTRAVENOUS | Status: DC
  Administered 2021-04-13: 10 mL

## 2021-04-12 MED ORDER — SODIUM CHLORIDE 0.9% IV SOLUTION: Freq: Once | INTRAVENOUS | Status: AC

## 2021-04-12 MED ORDER — FOLIC ACID 1 MG PO TABS
1.0000 mg | ORAL_TABLET | Freq: Every day | ORAL | Status: DC
  Administered 2021-04-12 – 2021-04-14 (×3): 1 mg via ORAL
  Filled 2021-04-12 (×3): qty 1

## 2021-04-12 NOTE — Progress Notes (Signed)
Peripherally Inserted Central Catheter Placement  The IV Nurse has discussed with the patient and/or persons authorized to consent for the patient, the purpose of this procedure and the potential benefits and risks involved with this procedure.  The benefits include less needle sticks, lab draws from the catheter, and the patient may be discharged home with the catheter. Risks include, but not limited to, infection, bleeding, blood clot (thrombus formation), and puncture of an artery; nerve damage and irregular heartbeat and possibility to perform a PICC exchange if needed/ordered by physician.  Alternatives to this procedure were also discussed.  Bard Power PICC patient education guide, fact sheet on infection prevention and patient information card has been provided to patient /or left at bedside.    PICC Placement Documentation  PICC Double Lumen 75/88/32 PICC Right Basilic 36 cm 0 cm (Active)  Indication for Insertion or Continuance of Line Poor Vasculature-patient has had multiple peripheral attempts or PIVs lasting less than 24 hours 04/12/21 1857  Exposed Catheter (cm) 0 cm 04/12/21 1857  Site Assessment Clean;Dry;Intact 04/12/21 1857  Lumen #1 Status Flushed;Saline locked;Blood return noted 04/12/21 1857  Lumen #2 Status Flushed;Saline locked;Blood return noted 04/12/21 1857  Dressing Type Transparent;Securing device 04/12/21 1857  Dressing Status Clean;Dry;Intact 04/12/21 1857  Antimicrobial disc in place? Yes 04/12/21 1857  Safety Lock Not Applicable 54/98/26 4158  Line Care Connections checked and tightened 04/12/21 1857  Line Adjustment (NICU/IV Team Only) No 04/12/21 1857  Dressing Intervention New dressing;Securement device changed;Antimicrobial disc changed 04/12/21 1857  Dressing Change Due 04/19/21 04/12/21 1857       Quin Hoop 04/12/2021, 7:07 PM

## 2021-04-12 NOTE — Evaluation (Signed)
Physical Therapy Evaluation Patient Details Name: Mikayla Rios MRN: [id number] DOB: July 25, 1985 Today's Date: 04/12/2021  History of Present Illness  Mikayla Rios is a 35 yo female presents with complaints of generalized weakness and lightheadedness. PMH: HTN, sinus tachny, obesity, obstructive sleep apnea and diverticulosis with complicated diverticulitis and abscess formation 11/2020 status post IR guided placement of 2 left lower quadrant drains   Clinical Impression  Pt admitted with above diagnosis. Pt requiring assistance from spouse since May 2022 due to 2 drains and multiple surgeries resulting in weakness; pt reports able to complete bathroom transfers while spouse at work independently up until the past couple of days. Pt currently requiring +2 physical assist due to weakness, pain and elevated HR with mobility. Pt requires increased time with mobility and VC for breathing to avoid breath holding. Pt very motivated to regain independence and is hopeful with 1 drain removed yesterday. Pt with significantly elevated HR with transfer up to 171, improves to 130s once supine in bed- RN and MD in room aware. Pt currently with functional limitations due to the deficits listed below (see PT Problem List). Pt will benefit from skilled PT to increase their independence and safety with mobility to allow discharge to the venue listed below.          Recommendations for follow up therapy are one component of a multi-disciplinary discharge planning process, led by the attending physician.  Recommendations may be updated based on patient status, additional functional criteria and insurance authorization.  Follow Up Recommendations Acute inpatient rehab (3hours/day)    Assistance Recommended at Discharge Frequent or constant Supervision/Assistance  Functional Status Assessment Patient has had a recent decline in their functional status and demonstrates the ability to make significant improvements in  function in a reasonable and predictable amount of time.   Equipment Recommendations  Other (comment) (TBD)    Recommendations for Other Services       Precautions / Restrictions Precautions Precautions: Fall Precaution Comments: monitor HR Restrictions Weight Bearing Restrictions: No      Mobility  Bed Mobility Overal bed mobility: Needs Assistance Bed Mobility: Sit to Supine  Sit to supine: Mod assist;+2 for physical assistance   General bed mobility comments: mod A +2 to return to supine for management of trunk and BLE, elevated HR and pain with mobility limiting    Transfers Overall transfer level: Needs assistance Equipment used: Rolling walker (2 wheels) Transfers: Sit to/from [employer] Transfers Sit to Stand: Mod assist;+2 physical assistance Stand pivot transfers: Min guard;+2 safety/equipment  General transfer comment: mod A +2 from lower seated recliner, dependent on BUE assisting to power up; once on feet fair steadiness requiring min G +2 to pivot back to bed, pt able to take slow minimal steps with fair steadiness, limited by pain and elevated HR    Ambulation/Gait  General Gait Details: HR up to 171 with stand pivot transfer and 2-3 steps at bedside- will hold until more appropriate  Stairs            Wheelchair Mobility    Modified Rankin (Stroke Patients Only)       Balance Overall balance assessment: Needs assistance Sitting-balance support: No upper extremity supported Sitting balance-Leahy Scale: Fair Sitting balance - Comments: seated EOB   Standing balance support: Bilateral upper extremity supported;Reliant on assistive device for balance;During functional activity Standing balance-Leahy Scale: Poor       Pertinent Vitals/Pain Pain Assessment: 0-10 Pain Score: 6  Pain Location: abdomen/all over Pain Descriptors /  Indicators: Aching;Sore Pain Intervention(s): Limited activity within patient's tolerance;Monitored during  session;Repositioned    Home Living Family/patient expects to be discharged to:: Private residence Living Arrangements: Spouse/significant other;Children Available Help at Discharge: Family;Available PRN/intermittently Type of Home:  (townhome) Home Access: Stairs to enter   Entrance Stairs-Number of Steps: 1 Alternate Level Stairs-Number of Steps: 14 Home Layout: Two level;1/2 bath on main level Home Equipment: None      Prior Function Prior Level of Function : Needs assist       Physical Assist : Mobility (physical);ADLs (physical) Mobility (physical): Transfers;Stairs (reports husband has been helping her "up")   Mobility Comments: Pt reports has had 2 drains since May 2022, spouse assists with transfers and stairs when he is home, did not use device, able to perform limited transfers while spouse at work until ~3 days ago. ADLs Comments: Pt reports has been performing sponge baths, spouse assists as needed, children perform household chores     Hand Dominance        Extremity/Trunk Assessment   Upper Extremity Assessment Upper Extremity Assessment: Defer to OT evaluation    Lower Extremity Assessment Lower Extremity Assessment: Generalized weakness (symmetrical)    Cervical / Trunk Assessment Cervical / Trunk Assessment: Normal  Communication   Communication: No difficulties  Cognition Arousal/Alertness: Lethargic Behavior During Therapy: WFL for tasks assessed/performed;Flat affect Overall Cognitive Status: Within Functional Limits for tasks assessed  General Comments: pt pleasant, follows commands appropriately, lethargy suspect to morphine        General Comments General comments (skin integrity, edema, etc.): HR 130-150 while seated on chair conversating with therapist, very uncomfortable in chair requesting to get back to bed, HR up to 171 with transfer, back down to 130s once supine in bed- RN and MD in room at [facility] aware    Exercises      Assessment/Plan    PT Assessment Patient needs continued PT services  PT Problem List Decreased strength;Decreased range of motion;Decreased activity tolerance;Decreased balance;Decreased mobility;Obesity;Pain;Cardiopulmonary status limiting activity       PT Treatment Interventions DME instruction;Gait training;Functional mobility training;Therapeutic activities;Therapeutic exercise;Balance training;Patient/family education    PT Goals (Current goals can be found in the Care Plan section)  Acute Rehab PT Goals Patient Stated Goal: regain independence PT Goal Formulation: With patient Time For Goal Achievement: 04/26/21 Potential to Achieve Goals: Good    Frequency Min 3X/week   Barriers to discharge        Co-evaluation               AM-PAC PT "6 Clicks" Mobility  Outcome Measure Help needed turning from your back to your side while in a flat bed without using bedrails?: A Lot Help needed moving from lying on your back to sitting on the side of a flat bed without using bedrails?: A Lot Help needed moving to and from a bed to a chair (including a wheelchair)?: A Lot Help needed standing up from a chair using your arms (e.g., wheelchair or bedside chair)?: A Lot Help needed to walk in hospital room?: A Lot Help needed climbing 3-5 steps with a railing? : Total 6 Click Score: 11    End of Session Equipment Utilized During Treatment: Gait belt Activity Tolerance: Patient limited by pain;Treatment limited secondary to medical complications (Comment) (monitor HR) Patient left: in bed;with call bell/phone within reach;with bed alarm set;with nursing/sitter in room (MD and RN in room) Nurse Communication: Mobility status PT Visit Diagnosis: Unsteadiness on feet (R26.81);Other abnormalities of gait  and mobility (R26.89);Muscle weakness (generalized) (M62.81);Pain Pain - part of body:  (abdomen)    Time: 6244-6950 PT Time Calculation (min) (ACUTE ONLY): 37  min   Charges:   PT Evaluation $PT Eval Moderate Complexity: 1 Mod PT Treatments $Therapeutic Activity: 8-22 mins         Tori Regie Bunner PT, DPT 04/12/21, 1:02 PM

## 2021-04-12 NOTE — Progress Notes (Signed)
PROGRESS NOTE    Mikayla Rios  UUV:[id number] DOB: November 18, 1985 DOA: 04/10/2021 PCP: [name], MD    Chief Complaint  Patient presents with   Weakness   Tachycardia    Brief Narrative:   35 year old female with past medical history of hypertension, inappropriate sinus tachycardia, obesity, obstructive sleep apnea and diverticulosis with complicated diverticulitis and abscess formation 11/2020 status post IR guided placement of 2 left lower quadrant drains who presents to [facility] emergency department with complaints of generalized weakness and lightheadedness.  Assessment & Plan:   Principal Problem:   Diverticulitis of large intestine with abscess Active Problems:   Obstructive sleep apnea   Anemia   Inappropriate sinus tachycardia   Essential hypertension   SIRS (systemic inflammatory response syndrome) (HCC)   Hypokalemia, inadequate intake   Protein-calorie malnutrition, severe (HCC)   Reactive thrombocytosis   Prolonged QT interval   Complicated diverticulitis with abscess s/p drain placement by IR twice in June 2022 with numerous exchanges since then due to persistent fistulous -CT of the abdomen and pelvis New lobulated enhancing fluid collection posterior to this segment of inflamed colon measuring 8.5 x 3.5 x 10.0 cm. This collection now invades the adjacent iliopsoas muscle as well as extends through the left lateral abdominal wall. -CT imaging today shows the anterior drain has been pulled back into subcutaneous tissue and hence it was removed by IR at bedside. The left lateral drain is within a large collection measuring about 8.5 into 10 cm. Patient reports no output.  It was flushed by IR and aspirated with return of purulent and foul-smelling material and the drain was connected to JP bulb. Plan for drain exchange/upsize on Monday by IR Meanwhile patient is on IV Rocephin and Flagyl for intra-abdominal infection. General surgery on board and  suggest that she will need surgery, and ostomy if the abscesses do not resolve with per cutaneous approach.  Remains afebrile but with persistent leukocytosis. Cultures from the drain sent for analysis.    Pyuria Urine cultures ordered and patient already started on antibiotics.   Patient reports dizziness and an episode of syncope and an episode of mechanical fall possibly from orthostatic hypotension.  Monitor on telemetry. CT angiogram of the chest ruled out PE EKG shows prolonged QT interval 636.  Nonspecific T wave inversions.troponin is negative. Patient denies any chest pain or shortness of breath. Probably secondary to orthostatic hypotension from infection. Get orthostatic vital signs tomorrow and therapy evaluations will be ordered.   Hypokalemia Replaced,.   Prolonged QTc: - from hypokalemia and hypomagnesemia. - K is 4.3. magnesium is 1.8. Keep k >4 and mag>2.   Repeat EKG shows improvement in the qtC.    Anemia of chronic disease:/ acute anemia of acute illness - normocytic.  - anemia panel  Shows low folate levels.  Drop in hemoglobin from 8.4 to 7.1.  1 unit of prbc transfusion ordered.  Recheck hemoglobin tomorrow.    Thrombocytosis:  Possibly from the infection.    Hypoalbuminemia:  - delaying the infection.  - will get dietary consult.    Inappropriate tachycardia:  Pt not symptomatic.  Echocardiogram ordered.  On metoprolol 25 mg BID.    Hypertension:  Well controlled.    DVT prophylaxis: scd's Code Status: (Full code) Family Communication: NONE AT BEDSIDE Disposition:   Status is: Inpatient  Remains inpatient appropriate because: IV ANTIBIOTICS       Consultants:  Surgery IR  Procedures: none.   Antimicrobials:  Antibiotics Given (last  72 hours)     Date/Time Action Medication Dose Rate   04/10/21 2220 New Bag/Given   cefTRIAXone (ROCEPHIN) 2 g in sodium chloride 0.9 % 100 mL IVPB 2 g 200 mL/hr   04/10/21 2233 New  Bag/Given   metroNIDAZOLE (FLAGYL) IVPB 500 mg 500 mg 100 mL/hr   04/11/21 1021 New Bag/Given   metroNIDAZOLE (FLAGYL) IVPB 500 mg 500 mg 100 mL/hr   04/11/21 2101 New Bag/Given   cefTRIAXone (ROCEPHIN) 2 g in sodium chloride 0.9 % 100 mL IVPB 2 g 200 mL/hr   04/11/21 2213 New Bag/Given   metroNIDAZOLE (FLAGYL) IVPB 500 mg 500 mg 100 mL/hr   04/12/21 0852 New Bag/Given   metroNIDAZOLE (FLAGYL) IVPB 500 mg 500 mg 100 mL/hr         Subjective: Persistent dizziness on getting our of bed   Objective: Vitals:   04/12/21 0037 04/12/21 0604 04/12/21 0854 04/12/21 1105  BP: 105/67 110/79  105/76  Pulse: 96 (!) 102 (!) 120 (!) 120  Resp: 18 20  20   Temp: 97.6 F (36.4 C) 98 F (36.7 C)  98.1 F (36.7 C)  TempSrc:    Oral  SpO2: 100% 100%  100%  Weight:      Height:        Intake/Output Summary (Last 24 hours) at 04/12/2021 1221 Last data filed at 04/12/2021 0300 Gross per 24 hour  Intake 2495.28 ml  Output 145 ml  Net 2350.28 ml    Filed Weights   04/10/21 1436  Weight: 90.7 kg    Examination:  General exam: Appears calm and comfortable  Respiratory system: Clear to auscultation. Respiratory effort normal. Cardiovascular system: S1 & S2 heard, RRR. No JVD,. No pedal edema. Gastrointestinal system: Abdomen is soft, tender abdomen, bowel sounds wnl. S/p ir drain.  Central nervous system: Alert and oriented. No focal neurological deficits. Extremities: Symmetric 5 x 5 power. Skin: No rashes, lesions or ulcers Psychiatry:  Mood & affect appropriate.      Data Reviewed: I have personally reviewed following labs and imaging studies  CBC: Recent Labs  Lab 04/10/21 1446 04/11/21 0302 04/12/21 0525  WBC 14.1* 15.4* 8.8  NEUTROABS 12.2*  --   --   HGB 8.4* 8.4* 7.1*  HCT 27.3* 26.8* 22.7*  MCV 89.2 87.9 89.7  PLT 777* 696* 433*     Basic Metabolic Panel: Recent Labs  Lab 04/10/21 1446 04/10/21 1707 04/11/21 0302 04/12/21 0525  NA 135  --  135 136  K  2.7*  --  4.3 3.8  CL 97*  --  100 103  CO2 27  --  25 26  GLUCOSE 84  --  82 81  BUN 17  --  16 12  CREATININE 0.46  --  0.49 0.40*  CALCIUM 7.5*  --  7.6* 7.3*  MG  --  1.9 1.8  --      GFR: Estimated Creatinine Clearance: 102.9 mL/min (A) (by C-G formula based on SCr of 0.4 mg/dL (L)).  Liver Function Tests: Recent Labs  Lab 04/10/21 1446 04/11/21 0302  AST 16 16  ALT 19 19  ALKPHOS 143* 147*  BILITOT 0.6 0.7  PROT 6.2* 6.2*  ALBUMIN 1.6* 1.7*     CBG: No results for input(s): GLUCAP in the last 168 hours.   Recent Results (from the past 240 hour(s))  Resp Panel by RT-PCR (Flu A&B, Covid) Nasopharyngeal Swab     Status: None   Collection Time: 04/10/21  2:46 PM  Specimen: Nasopharyngeal Swab; Nasopharyngeal(NP) swabs in vial transport medium  Result Value Ref Range Status   SARS Coronavirus 2 by RT PCR NEGATIVE NEGATIVE Final    Comment: (NOTE) SARS-CoV-2 target nucleic acids are NOT DETECTED.  The SARS-CoV-2 RNA is generally detectable in upper respiratory specimens during the acute phase of infection. The lowest concentration of SARS-CoV-2 viral copies this assay can detect is 138 copies/mL. A negative result does not preclude SARS-Cov-2 infection and should not be used as the sole basis for treatment or other patient management decisions. A negative result may occur with  improper specimen collection/handling, submission of specimen other than nasopharyngeal swab, presence of viral mutation(s) within the areas targeted by this assay, and inadequate number of viral copies(<138 copies/mL). A negative result must be combined with clinical observations, patient history, and epidemiological information. The expected result is Negative.  Fact Sheet for Patients:  EntrepreneurPulse.com.au  Fact Sheet for Healthcare Providers:  IncredibleEmployment.be  This test is no t yet approved or cleared by the [place] FDA and   has been authorized for detection and/or diagnosis of SARS-CoV-2 by FDA under an Emergency Use Authorization (EUA). This EUA will remain  in effect (meaning this test can be used) for the duration of the COVID-19 declaration under Section 564(b)(1) of the Act, 21 U.S.C.section 360bbb-3(b)(1), unless the authorization is terminated  or revoked sooner.       Influenza A by PCR NEGATIVE NEGATIVE Final   Influenza B by PCR NEGATIVE NEGATIVE Final    Comment: (NOTE) The Xpert Xpress SARS-CoV-2/FLU/RSV plus assay is intended as an aid in the diagnosis of influenza from Nasopharyngeal swab specimens and should not be used as a sole basis for treatment. Nasal washings and aspirates are unacceptable for Xpert Xpress SARS-CoV-2/FLU/RSV testing.  Fact Sheet for Patients: EntrepreneurPulse.com.au  Fact Sheet for Healthcare Providers: IncredibleEmployment.be  This test is not yet approved or cleared by the [place] FDA and has been authorized for detection and/or diagnosis of SARS-CoV-2 by FDA under an Emergency Use Authorization (EUA). This EUA will remain in effect (meaning this test can be used) for the duration of the COVID-19 declaration under Section 564(b)(1) of the Act, 21 U.S.C. section 360bbb-3(b)(1), unless the authorization is terminated or revoked.  Performed at [facility], [place] [address] [place], [place] [address]   Blood culture (routine x 2)     Status: None (Preliminary result)   Collection Time: 04/10/21  4:42 PM   Specimen: BLOOD  Result Value Ref Range Status   Specimen Description   Final    BLOOD RIGHT ANTECUBITAL Performed at [facility] [address], [place], [place] [address]    Special Requests   Final    BOTTLES DRAWN AEROBIC AND ANAEROBIC Blood Culture adequate volume Performed at [facility] [address], [place], [place] [address]    Culture    Final    NO GROWTH 2 DAYS Performed at [facility] [address], [place], [place] [address]    Report Status PENDING  Incomplete  Aerobic/Anaerobic Culture w Gram Stain (surgical/deep wound)     Status: None (Preliminary result)   Collection Time: 04/10/21  7:56 PM   Specimen: Abscess  Result Value Ref Range Status   Specimen Description   Final    ABSCESS Performed at [facility] [address], [place], [place] [address]    Special Requests   Final    NONE Performed at [facility]  [place] [address], [place], [place] [address]    Gram Stain   Final    NO SQUAMOUS EPITHELIAL CELLS SEEN FEW WBC SEEN FEW GRAM POSITIVE COCCI    Culture   Final    CULTURE REINCUBATED FOR BETTER GROWTH Performed at [facility], [place] [address], [place], [place] [address]    Report Status PENDING  Incomplete          Radiology Studies: CT Angio Chest PE W and/or Wo Contrast  Result Date: 04/10/2021 CLINICAL DATA:  High probability PE. Being currently treated for abscess diverticulitis. Tachycardia. EXAM: CT ANGIOGRAPHY CHEST CT ABDOMEN AND PELVIS WITH CONTRAST TECHNIQUE: Multidetector CT imaging of the chest was performed using the standard protocol during bolus administration of intravenous contrast. Multiplanar CT image reconstructions and MIPs were obtained to evaluate the vascular anatomy. Multidetector CT imaging of the abdomen and pelvis was performed using the standard protocol during bolus administration of intravenous contrast. CONTRAST:  65mL OMNIPAQUE IOHEXOL 350 MG/ML SOLN COMPARISON:  CT abdomen and pelvis 04/01/2021. CT abdomen and pelvis 11/27/2020. FINDINGS: CTA CHEST FINDINGS Cardiovascular: Satisfactory opacification of the pulmonary arteries to the segmental level. No evidence of pulmonary embolism. Normal heart size. No pericardial effusion. Mediastinum/Nodes: No enlarged mediastinal, hilar, or axillary lymph nodes. Thyroid  gland, trachea, and esophagus demonstrate no significant findings. Lungs/Pleura: Lungs are clear. No pleural effusion or pneumothorax. Musculoskeletal: No chest wall abnormality. No acute or significant osseous findings. Review of the MIP images confirms the above findings. CT ABDOMEN and PELVIS FINDINGS Hepatobiliary: There is diffuse fatty infiltration of the liver. Gallstones are present. There is no biliary ductal dilatation. Pancreas: Unremarkable. No pancreatic ductal dilatation or surrounding inflammatory changes. Spleen: Normal in size without focal abnormality. Adrenals/Urinary Tract: There is a 3.5 x 2.8 cm stable left adrenal nodule previously characterized as adenoma. Right adrenal gland is within normal limits. There is a cyst in the inferior pole the left kidney measuring 3 cm, unchanged. The kidneys otherwise appear within normal limits. The bladder is within normal limits. Stomach/Bowel: There is no evidence for bowel obstruction. There is marked focal wall thickening of the mid descending colon with surrounding inflammatory stranding. This has slightly increased when compared to the prior study. There is a small amount of extraluminal gas compatible with perforation. Gas has increased when compared to the prior examination. Stomach is within normal limits. Vascular/Lymphatic: No significant vascular findings are present. Aorta and IVC are normal in size. There are prominent and enlarged central mesenteric lymph nodes which are new from the prior examination. The largest lymph node measures 1.5 by 2.4 cm image 4/49. Reproductive: Again seen is a hypodense lesion in the fundus of the uterus containing some calcifications measuring 4.7 cm similar to the prior study. Adnexa are within normal limits. Other: Previously identified percutaneous drainage catheter in the anterior left abdomen has been pulled back in the interval. The distal catheter tip is in the deep subcutaneous soft tissues of the anterior  abdominal wall. Previously identified left lateral percutaneous drainage catheter is unchanged in position. The distal portion of the catheter is in a new lobulated enhancing fluid collection containing air abutting the inflamed portion of the colon. This collection measures 8.5 x 3.5 x 10.0 cm and is now within the adjacent iliopsoas musculature and abutting the left iliacus musculature. This fluid collection also extends through the left lateral abdominal wall on image 4/51, a new finding. There is trace free fluid in the pelvis. There is  no focal abdominal wall hernia. Musculoskeletal: No acute or significant osseous findings. Review of the MIP images confirms the above findings. IMPRESSION: 1. Again seen are findings compatible with descending colon diverticulitis with perforation. Free air and inflammation have increased in the interval. 2. New lobulated enhancing fluid collection posterior to this segment of inflamed colon measuring 8.5 x 3.5 x 10.0 cm. This collection now invades the adjacent iliopsoas muscle as well as extends through the left lateral abdominal wall. The tip of the drainage catheter is in this collection. Findings are compatible with abscess. 3. Anterior left percutaneous drainage catheter tip has been pulled back and is now within the subcutaneous tissues. 4. Trace free fluid. 5. No pulmonary embolism.  No acute cardiopulmonary process. 6. Cholelithiasis. 7. Fatty infiltration of the liver. Electronically Signed   By: Ronney Asters M.D.   On: 04/10/2021 19:01   CT ABDOMEN PELVIS W CONTRAST  Result Date: 04/10/2021 CLINICAL DATA:  High probability PE. Being currently treated for abscess diverticulitis. Tachycardia. EXAM: CT ANGIOGRAPHY CHEST CT ABDOMEN AND PELVIS WITH CONTRAST TECHNIQUE: Multidetector CT imaging of the chest was performed using the standard protocol during bolus administration of intravenous contrast. Multiplanar CT image reconstructions and MIPs were obtained to  evaluate the vascular anatomy. Multidetector CT imaging of the abdomen and pelvis was performed using the standard protocol during bolus administration of intravenous contrast. CONTRAST:  62mL OMNIPAQUE IOHEXOL 350 MG/ML SOLN COMPARISON:  CT abdomen and pelvis 04/01/2021. CT abdomen and pelvis 11/27/2020. FINDINGS: CTA CHEST FINDINGS Cardiovascular: Satisfactory opacification of the pulmonary arteries to the segmental level. No evidence of pulmonary embolism. Normal heart size. No pericardial effusion. Mediastinum/Nodes: No enlarged mediastinal, hilar, or axillary lymph nodes. Thyroid gland, trachea, and esophagus demonstrate no significant findings. Lungs/Pleura: Lungs are clear. No pleural effusion or pneumothorax. Musculoskeletal: No chest wall abnormality. No acute or significant osseous findings. Review of the MIP images confirms the above findings. CT ABDOMEN and PELVIS FINDINGS Hepatobiliary: There is diffuse fatty infiltration of the liver. Gallstones are present. There is no biliary ductal dilatation. Pancreas: Unremarkable. No pancreatic ductal dilatation or surrounding inflammatory changes. Spleen: Normal in size without focal abnormality. Adrenals/Urinary Tract: There is a 3.5 x 2.8 cm stable left adrenal nodule previously characterized as adenoma. Right adrenal gland is within normal limits. There is a cyst in the inferior pole the left kidney measuring 3 cm, unchanged. The kidneys otherwise appear within normal limits. The bladder is within normal limits. Stomach/Bowel: There is no evidence for bowel obstruction. There is marked focal wall thickening of the mid descending colon with surrounding inflammatory stranding. This has slightly increased when compared to the prior study. There is a small amount of extraluminal gas compatible with perforation. Gas has increased when compared to the prior examination. Stomach is within normal limits. Vascular/Lymphatic: No significant vascular findings are  present. Aorta and IVC are normal in size. There are prominent and enlarged central mesenteric lymph nodes which are new from the prior examination. The largest lymph node measures 1.5 by 2.4 cm image 4/49. Reproductive: Again seen is a hypodense lesion in the fundus of the uterus containing some calcifications measuring 4.7 cm similar to the prior study. Adnexa are within normal limits. Other: Previously identified percutaneous drainage catheter in the anterior left abdomen has been pulled back in the interval. The distal catheter tip is in the deep subcutaneous soft tissues of the anterior abdominal wall. Previously identified left lateral percutaneous drainage catheter is unchanged in position. The distal portion of the  catheter is in a new lobulated enhancing fluid collection containing air abutting the inflamed portion of the colon. This collection measures 8.5 x 3.5 x 10.0 cm and is now within the adjacent iliopsoas musculature and abutting the left iliacus musculature. This fluid collection also extends through the left lateral abdominal wall on image 4/51, a new finding. There is trace free fluid in the pelvis. There is no focal abdominal wall hernia. Musculoskeletal: No acute or significant osseous findings. Review of the MIP images confirms the above findings. IMPRESSION: 1. Again seen are findings compatible with descending colon diverticulitis with perforation. Free air and inflammation have increased in the interval. 2. New lobulated enhancing fluid collection posterior to this segment of inflamed colon measuring 8.5 x 3.5 x 10.0 cm. This collection now invades the adjacent iliopsoas muscle as well as extends through the left lateral abdominal wall. The tip of the drainage catheter is in this collection. Findings are compatible with abscess. 3. Anterior left percutaneous drainage catheter tip has been pulled back and is now within the subcutaneous tissues. 4. Trace free fluid. 5. No pulmonary embolism.   No acute cardiopulmonary process. 6. Cholelithiasis. 7. Fatty infiltration of the liver. Electronically Signed   By: Ronney Asters M.D.   On: 04/10/2021 19:01   DG Chest Portable 1 View  Result Date: 04/10/2021 CLINICAL DATA:  Weakness. EXAM: PORTABLE CHEST 1 VIEW COMPARISON:  March 31, 2016 FINDINGS: The heart size and mediastinal contours are within normal limits. Both lungs are clear. The visualized skeletal structures are unremarkable. IMPRESSION: No active disease. Electronically Signed   By: Virgina Norfolk M.D.   On: 04/10/2021 15:51        Scheduled Meds:  enoxaparin (LOVENOX) injection  40 mg Subcutaneous N56O   folic acid  1 mg Oral Daily   metoprolol succinate  25 mg Oral BID   sodium chloride flush  10 mL Intracatheter Q12H   Continuous Infusions:  cefTRIAXone (ROCEPHIN)  IV 2 g (04/11/21 2101)   lactated ringers     lactated ringers 75 mL/hr at 04/12/21 0602   metronidazole 500 mg (04/12/21 0852)     LOS: 2 days        Hosie Poisson, MD [facility]   To contact the attending provider between 7A-7P or the covering provider during after hours 7P-7A, please log into the web site [email or web address] and access using universal [place] password for that web site. If you do not have the password, please call the hospital operator.  04/12/2021, 12:21 PM

## 2021-04-12 NOTE — Evaluation (Signed)
Occupational Therapy Evaluation Patient Details Name: Mikayla Rios MRN: [id number] DOB: [birth year] Today's Date: 04/12/2021   History of Present Illness 35 year old female with past medical history of hypertension, inappropriate sinus tachycardia, obesity, obstructive sleep apnea and diverticulosis with complicated diverticulitis and abscess formation 11/2020 status post IR guided placement of 2 left lower quadrant drains who presents to [facility] emergency department with complaints of generalized weakness and lightheadedness.   Clinical Impression   [name] is a 35 year old woman who presents with generalized weakness, decreased activity tolerance, impaired balance and pain. Prior to admission patient reports getting weaker since May since her diverticulitis problems began. Her husband has been helping her "up" and has been needing increased assistance for ADLs and ambulation at home in the last week. She reports a sudden increase in weakness the last couple of days with both upper and lower extremities being weak. Today patient reports "I was able to hold my arms up to wash my face." She was min assist to transfer to side of bed and min guard to stand from elevated bed and transfer to recliner. She was fearful and needed increased time to perform. Her HR became elevated with minimal activity up to 158 at one point after transfer to chair. Patient had to use walker today and doesn't typically use a device. Patient total assist for LB dressing and toileting and requiring seated position and set up for UB ADLs. Patient also has pain in abdomen with movement limiting her tolerance for activity. Patient will benefit from skilled OT services while in hospital to improve deficits and learn compensatory strategies as needed in order to return to PLOF.  Currently recommending aggressive short term rehab at discharge.     Recommendations for follow up therapy are one component of a  multi-disciplinary discharge planning process, led by the attending physician.  Recommendations may be updated based on patient status, additional functional criteria and insurance authorization.   Follow Up Recommendations  CIR    Assistance Recommended at Discharge Frequent Supervision/Assistance  Functional Status Assessment  Patient has had a recent decline in their functional status and demonstrates the ability to make significant improvements in function in a reasonable and predictable amount of time.  Equipment Recommendations  BSC (bari BSC (wide))    Recommendations for Other Services       Precautions / Restrictions Precautions Precautions: Fall Restrictions Weight Bearing Restrictions: No      Mobility Bed Mobility Overal bed mobility: Needs Assistance Bed Mobility: Supine to Sit     Supine to sit: Min assist;HOB elevated     General bed mobility comments: min assi sto transfer into sitting.    Transfers Overall transfer level: Needs assistance Equipment used: Rolling walker (2 wheels) Transfers: Sit to/from [facility] Sit to Stand: Min guard;From elevated surface Stand pivot transfers: Min guard         General transfer comment: min guard to stand - needed increased time to perform due to fear and min guard to take steps to recliner only. use of RW that she typically doesn't use. Pain in abdomen with activity.      Balance                                           ADL either performed or assessed with clinical judgement   ADL Overall ADL's : Needs  assistance/impaired Eating/Feeding: Set up;Sitting   Grooming: Set up;Sitting   Upper Body Bathing: Set up;Sitting   Lower Body Bathing: Maximal assistance;Sitting/lateral leans   Upper Body Dressing : Set up;Sitting   Lower Body Dressing: Total assistance;Sit to/from stand Lower Body Dressing Details (indicate cue type and reason): reliant on walker to stand  neding total assistance for all clothing. Toilet Transfer: Min guard;BSC;Squat-pivot;Rolling walker (2 wheels)   Toileting- Water quality scientist and Hygiene: Total assistance;Sit to/from stand Toileting - Clothing Manipulation Details (indicate cue type and reason): patient reliant on UEs on walker - needs assistance for hygiene and clothing management.     Functional mobility during ADLs: Min guard;Rolling walker (2 wheels) General ADL Comments: min guard for sit to stand and only able to take steps to reclner     Vision Patient Visual Report: No change from baseline       Perception     Praxis      Pertinent Vitals/Pain Pain Assessment: 0-10 Pain Score: 4  Pain Descriptors / Indicators: Grimacing;Guarding Pain Intervention(s): Limited activity within patient's tolerance;Monitored during session;Premedicated before session     Hand Dominance     Extremity/Trunk Assessment Upper Extremity Assessment Upper Extremity Assessment: Generalized weakness   Lower Extremity Assessment Lower Extremity Assessment: Defer to PT evaluation   Cervical / Trunk Assessment Cervical / Trunk Assessment: Normal   Communication Communication Communication: No difficulties   Cognition Arousal/Alertness: Awake/alert Behavior During Therapy: WFL for tasks assessed/performed Overall Cognitive Status: Within Functional Limits for tasks assessed                                       General Comments       Exercises     Shoulder Instructions      Home Living Family/patient expects to be discharged to:: Private residence Living Arrangements: Spouse/significant other;Children Available Help at Discharge: Family;Available PRN/intermittently Type of Home:  (townhome) Home Access: Stairs to enter [employer] of Steps: 1   Home Layout: Two level;1/2 bath on main level Alternate Level Stairs-Number of Steps: 14   Bathroom Shower/Tub: Animal nutritionist: Standard     Home Equipment: None          Prior Functioning/Environment Prior Level of Function : Needs assist       Physical Assist : Mobility (physical);ADLs (physical) Mobility (physical): Transfers;Stairs (reports husband has been helping her "up")   Mobility Comments: did not use device. ADLs Comments: has been performing sponge baths.        OT Problem List: Decreased strength;Decreased activity tolerance;Decreased knowledge of use of DME or AE;Pain;Obesity;Increased edema      OT Treatment/Interventions: Self-care/ADL training;Therapeutic exercise;DME and/or AE instruction;Therapeutic activities;Balance training;Patient/family education    OT Goals(Current goals can be found in the care plan section) Acute Rehab OT Goals Patient Stated Goal: to walk to bathroom OT Goal Formulation: With patient Time For Goal Achievement: 04/26/21 Potential to Achieve Goals: Good  OT Frequency: Min 2X/week   Barriers to D/C:            Co-evaluation              AM-PAC OT "6 Clicks" Daily Activity     Outcome Measure Help from another person eating meals?: A Little Help from another person taking care of personal grooming?: A Little Help from another person toileting, which includes using toliet, bedpan, or urinal?: Total Help  from another person bathing (including washing, rinsing, drying)?: A Lot Help from another person to put on and taking off regular upper body clothing?: A Little Help from another person to put on and taking off regular lower body clothing?: Total 6 Click Score: 13   End of Session Equipment Utilized During Treatment: Gait belt;Rolling walker (2 wheels) Nurse Communication: Mobility status  Activity Tolerance: Patient limited by pain Patient left: in chair;with call bell/phone within reach;with chair alarm set  OT Visit Diagnosis: Unsteadiness on feet (R26.81);Other abnormalities of gait and mobility (R26.89);History of  falling (Z91.81);Muscle weakness (generalized) (M62.81);Pain                Time: 3744-5146 OT Time Calculation (min): 34 min Charges:  OT General Charges $OT Visit: 1 Visit OT Evaluation $OT Eval Low Complexity: 1 Low OT Treatments $Therapeutic Activity: 8-22 mins  Darnella Zeiter, OTR/L [place] [phone] Pager: [place] 04/12/2021, 9:05 AM

## 2021-04-12 NOTE — Progress Notes (Deleted)
Cardiology Office Note:    Date:  04/12/2021   ID:  Mikayla Rios, DOB 11/27/1985, MRN [id number]  PCP:  [name], MD  Cardiologist:  None  Electrophysiologist:  None   Referring MD: [name], MD   No chief complaint on file.   History of Present Illness:    Mikayla Rios is a 35 y.o. female with a hx of hypertension, OSA who presents for follow-up. She was referred by Mikayla Brine, FNP for evaluation of tachycardia, initially seen on 10/18/2019. She reports that she has been having episodes where she feels like her heart is racing.  Occurs 1-2 times per day.  Denies any lightheadedness or syncope.  Denies any chest pain but does report she has been having dyspnea with exertion.  States that she been going for 5-minute walks, but has been limited by back pain and shortness of breath.  Does report intermittent lower extremity edema.  Reports she has gained about 20 pounds in the last year.  States that she checks her BP at home, has been in the 110s to 180s over 60s to 80s.  No smoking history.  Father was diagnosed with CHF in 54s.  Echocardiogram on 11/09/2019 showed normal biventricular function, no significant valvular disease. Zio patch x3 days on 11/12/2019 showed no significant arrhythmias.  Since last clinic visit,   she reports she has been doing well.  Lost 12 lbs since last clinic visit.  Stopped eating meat for awhile and now eating only chicken.  Has been doing home exercises on stairs.  Denies any chest pain or dyspnea.   Rare palpitations, only occurring about once per month now and lasting for a few seconds.  Reports BP has been 120-130/70s when checks at home.   Wt Readings from Last 3 Encounters:  04/10/21 200 lb (90.7 kg)  03/12/21 204 lb (92.5 kg)  01/27/21 250 lb (113.4 kg)     Past Medical History:  Diagnosis Date   Allergy    seasonal   Anemia    Blood infection 1985   Blood transfusion without reported diagnosis    Diverticulitis    Hypertension     Obesity    Sleep apnea     Past Surgical History:  Procedure Laterality Date   IR CATHETER TUBE CHANGE  12/12/2020   IR CATHETER TUBE CHANGE  01/27/2021   IR CATHETER TUBE CHANGE  02/19/2021   IR RADIOLOGIST EVAL & MGMT  11/27/2020   IR RADIOLOGIST EVAL & MGMT  12/11/2020   IR RADIOLOGIST EVAL & MGMT  01/07/2021   IR RADIOLOGIST EVAL & MGMT  02/04/2021   IR SINUS/FIST TUBE CHK-NON GI  12/12/2020   IR SINUS/FIST TUBE CHK-NON GI  02/19/2021    Current Medications: No outpatient medications have been marked as taking for the 04/14/21 encounter (Appointment) with [name], MD.     Allergies:   Shellfish allergy and Penicillins   Social History   Socioeconomic History   Marital status: Married    Spouse name: Mikayla Rios   Number of children: 3   Years of education: Not on file   Highest education level: Not on file  Occupational History   Not on file  Tobacco Use   Smoking status: Never   Smokeless tobacco: Never  Vaping Use   Vaping Use: Never used  Substance and Sexual Activity   Alcohol use: Not Currently   Drug use: No   Sexual activity: Yes    Partners: Male  Birth control/protection: None  Other Topics Concern   Not on file  Social History Narrative   Not on file   Social Determinants of Health   Financial Resource Strain: Not on file  Food Insecurity: Not on file  Transportation Needs: Not on file  Physical Activity: Not on file  Stress: Not on file  Social Connections: Not on file     Family History: The patient's family history includes Autism in her son; Cancer in her mother, paternal grandfather, and paternal uncle; Colon cancer in her mother; Congestive Heart Failure in her father; Diabetes in her maternal grandfather, maternal grandmother, mother, and paternal grandmother; Heart disease in her paternal aunt; Hypertension in her father, maternal grandmother, and mother; Kidney disease in her maternal grandfather.  ROS:   Please see  the history of present illness.     All other systems reviewed and are negative.  EKGs/Labs/Other Studies Reviewed:    The following studies were reviewed today:  EKG:  EKG is ordered today.  The ekg ordered today demonstrates sinus tachycardia, rate 118, nonspecific T wave flattening  Recent Labs: 04/11/2021: ALT 19; Magnesium 1.8 04/12/2021: BUN 12; Creatinine, Ser 0.40; Hemoglobin 7.1; Platelets 433; Potassium 3.8; Sodium 136  Recent Lipid Panel    Component Value Date/Time   CHOL 155 10/02/2019 1127   TRIG 88 10/02/2019 1127   HDL 39 (L) 10/02/2019 1127   CHOLHDL 4.0 10/02/2019 1127   LDLCALC 99 10/02/2019 1127    Physical Exam:    VS:  LMP 04/10/2021 (Approximate)     Wt Readings from Last 3 Encounters:  04/10/21 200 lb (90.7 kg)  03/12/21 204 lb (92.5 kg)  01/27/21 250 lb (113.4 kg)     GEN: in no acute distress HEENT: Normal NECK: No JVD LYMPHATICS: No lymphadenopathy CARDIAC: tachycardic, no murmurs, rubs, gallops RESPIRATORY:  Clear to auscultation without rales, wheezing or rhonchi  ABDOMEN: Soft, non-tender, non-distended MUSCULOSKELETAL:  No edema; No deformity  SKIN: Warm and dry NEUROLOGIC:  Alert and oriented x 3 PSYCHIATRIC:  Normal affect   ASSESSMENT:    No diagnosis found.  PLAN:     Tachycardia/palpitations:  Zio patch x3 days on 11/12/2019 showed no significant arrhythmias.  Reports palpitations have improved.  Dyspnea: Suspect related to deconditioning.  Echocardiogram on 11/09/2019 showed normal biventricular function, no significant valvular disease.  Hypertension: On lisinopril-hydrochlorothiazide 40-25 mg, Toprol-XL 25 mg twice daily, amlodipine 10 mg daily. Appears controlled.   OSA: Encourage compliance with CPAP  RTC in***  Medication Adjustments/Labs and Tests Ordered: Current medicines are reviewed at length with the patient today.  Concerns regarding medicines are outlined above.  No orders of the defined types were placed in  this encounter.  No orders of the defined types were placed in this encounter.   There are no Patient Instructions on file for this visit.   Signed, [name], MD  04/12/2021 10:13 PM    [facility]

## 2021-04-12 NOTE — Progress Notes (Signed)
Subjective/Chief Complaint: Pain and odor of drain improved with IV antibiotics.  One drain removed yesterday by IR   Objective: Vital signs in last 24 hours: Temp:  [97.6 F (36.4 C)-99.1 F (37.3 C)] 98 F (36.7 C) (11/06 0604) Pulse Rate:  [96-131] 120 (11/06 0854) Resp:  [16-23] 20 (11/06 0604) BP: (104-150)/(67-107) 110/79 (11/06 0604) SpO2:  [100 %] 100 % (11/06 0604) Last BM Date: 04/11/21  Intake/Output from previous day: 11/05 0701 - 11/06 0700 In: 2495.3 [P.O.:120; I.V.:2071.2; IV Piggyback:304.1] Out: 145 [Drains:145] Intake/Output this shift: No intake/output data recorded.  General appearance: a little groggy after meds, but very appropriate and answers questions easily Resp: breathing comfortably GI: soft, mildly distended, tender LLQ and left flank.   Extremities: extremities normal, atraumatic, no cyanosis or edema  Lab Results:  Recent Labs    04/11/21 0302 04/12/21 0525  WBC 15.4* 8.8  HGB 8.4* 7.1*  HCT 26.8* 22.7*  PLT 696* 433*   BMET Recent Labs    04/11/21 0302 04/12/21 0525  NA 135 136  K 4.3 3.8  CL 100 103  CO2 25 26  GLUCOSE 82 81  BUN 16 12  CREATININE 0.49 0.40*  CALCIUM 7.6* 7.3*   PT/INR Recent Labs    04/11/21 0302  LABPROT 15.1  INR 1.2   ABG No results for input(s): PHART, HCO3 in the last 72 hours.  Invalid input(s): PCO2, PO2  Studies/Results: CT Angio Chest PE W and/or Wo Contrast  Result Date: 04/10/2021 CLINICAL DATA:  High probability PE. Being currently treated for abscess diverticulitis. Tachycardia. EXAM: CT ANGIOGRAPHY CHEST CT ABDOMEN AND PELVIS WITH CONTRAST TECHNIQUE: Multidetector CT imaging of the chest was performed using the standard protocol during bolus administration of intravenous contrast. Multiplanar CT image reconstructions and MIPs were obtained to evaluate the vascular anatomy. Multidetector CT imaging of the abdomen and pelvis was performed using the standard protocol during bolus  administration of intravenous contrast. CONTRAST:  38mL OMNIPAQUE IOHEXOL 350 MG/ML SOLN COMPARISON:  CT abdomen and pelvis 04/01/2021. CT abdomen and pelvis 11/27/2020. FINDINGS: CTA CHEST FINDINGS Cardiovascular: Satisfactory opacification of the pulmonary arteries to the segmental level. No evidence of pulmonary embolism. Normal heart size. No pericardial effusion. Mediastinum/Nodes: No enlarged mediastinal, hilar, or axillary lymph nodes. Thyroid gland, trachea, and esophagus demonstrate no significant findings. Lungs/Pleura: Lungs are clear. No pleural effusion or pneumothorax. Musculoskeletal: No chest wall abnormality. No acute or significant osseous findings. Review of the MIP images confirms the above findings. CT ABDOMEN and PELVIS FINDINGS Hepatobiliary: There is diffuse fatty infiltration of the liver. Gallstones are present. There is no biliary ductal dilatation. Pancreas: Unremarkable. No pancreatic ductal dilatation or surrounding inflammatory changes. Spleen: Normal in size without focal abnormality. Adrenals/Urinary Tract: There is a 3.5 x 2.8 cm stable left adrenal nodule previously characterized as adenoma. Right adrenal gland is within normal limits. There is a cyst in the inferior pole the left kidney measuring 3 cm, unchanged. The kidneys otherwise appear within normal limits. The bladder is within normal limits. Stomach/Bowel: There is no evidence for bowel obstruction. There is marked focal wall thickening of the mid descending colon with surrounding inflammatory stranding. This has slightly increased when compared to the prior study. There is a small amount of extraluminal gas compatible with perforation. Gas has increased when compared to the prior examination. Stomach is within normal limits. Vascular/Lymphatic: No significant vascular findings are present. Aorta and IVC are normal in size. There are prominent and enlarged central mesenteric lymph nodes  which are new from the prior  examination. The largest lymph node measures 1.5 by 2.4 cm image 4/49. Reproductive: Again seen is a hypodense lesion in the fundus of the uterus containing some calcifications measuring 4.7 cm similar to the prior study. Adnexa are within normal limits. Other: Previously identified percutaneous drainage catheter in the anterior left abdomen has been pulled back in the interval. The distal catheter tip is in the deep subcutaneous soft tissues of the anterior abdominal wall. Previously identified left lateral percutaneous drainage catheter is unchanged in position. The distal portion of the catheter is in a new lobulated enhancing fluid collection containing air abutting the inflamed portion of the colon. This collection measures 8.5 x 3.5 x 10.0 cm and is now within the adjacent iliopsoas musculature and abutting the left iliacus musculature. This fluid collection also extends through the left lateral abdominal wall on image 4/51, a new finding. There is trace free fluid in the pelvis. There is no focal abdominal wall hernia. Musculoskeletal: No acute or significant osseous findings. Review of the MIP images confirms the above findings. IMPRESSION: 1. Again seen are findings compatible with descending colon diverticulitis with perforation. Free air and inflammation have increased in the interval. 2. New lobulated enhancing fluid collection posterior to this segment of inflamed colon measuring 8.5 x 3.5 x 10.0 cm. This collection now invades the adjacent iliopsoas muscle as well as extends through the left lateral abdominal wall. The tip of the drainage catheter is in this collection. Findings are compatible with abscess. 3. Anterior left percutaneous drainage catheter tip has been pulled back and is now within the subcutaneous tissues. 4. Trace free fluid. 5. No pulmonary embolism.  No acute cardiopulmonary process. 6. Cholelithiasis. 7. Fatty infiltration of the liver. Electronically Signed   By: Ronney Asters M.D.    On: 04/10/2021 19:01   CT ABDOMEN PELVIS W CONTRAST  Result Date: 04/10/2021 CLINICAL DATA:  High probability PE. Being currently treated for abscess diverticulitis. Tachycardia. EXAM: CT ANGIOGRAPHY CHEST CT ABDOMEN AND PELVIS WITH CONTRAST TECHNIQUE: Multidetector CT imaging of the chest was performed using the standard protocol during bolus administration of intravenous contrast. Multiplanar CT image reconstructions and MIPs were obtained to evaluate the vascular anatomy. Multidetector CT imaging of the abdomen and pelvis was performed using the standard protocol during bolus administration of intravenous contrast. CONTRAST:  67mL OMNIPAQUE IOHEXOL 350 MG/ML SOLN COMPARISON:  CT abdomen and pelvis 04/01/2021. CT abdomen and pelvis 11/27/2020. FINDINGS: CTA CHEST FINDINGS Cardiovascular: Satisfactory opacification of the pulmonary arteries to the segmental level. No evidence of pulmonary embolism. Normal heart size. No pericardial effusion. Mediastinum/Nodes: No enlarged mediastinal, hilar, or axillary lymph nodes. Thyroid gland, trachea, and esophagus demonstrate no significant findings. Lungs/Pleura: Lungs are clear. No pleural effusion or pneumothorax. Musculoskeletal: No chest wall abnormality. No acute or significant osseous findings. Review of the MIP images confirms the above findings. CT ABDOMEN and PELVIS FINDINGS Hepatobiliary: There is diffuse fatty infiltration of the liver. Gallstones are present. There is no biliary ductal dilatation. Pancreas: Unremarkable. No pancreatic ductal dilatation or surrounding inflammatory changes. Spleen: Normal in size without focal abnormality. Adrenals/Urinary Tract: There is a 3.5 x 2.8 cm stable left adrenal nodule previously characterized as adenoma. Right adrenal gland is within normal limits. There is a cyst in the inferior pole the left kidney measuring 3 cm, unchanged. The kidneys otherwise appear within normal limits. The bladder is within normal limits.  Stomach/Bowel: There is no evidence for bowel obstruction. There is marked focal  wall thickening of the mid descending colon with surrounding inflammatory stranding. This has slightly increased when compared to the prior study. There is a small amount of extraluminal gas compatible with perforation. Gas has increased when compared to the prior examination. Stomach is within normal limits. Vascular/Lymphatic: No significant vascular findings are present. Aorta and IVC are normal in size. There are prominent and enlarged central mesenteric lymph nodes which are new from the prior examination. The largest lymph node measures 1.5 by 2.4 cm image 4/49. Reproductive: Again seen is a hypodense lesion in the fundus of the uterus containing some calcifications measuring 4.7 cm similar to the prior study. Adnexa are within normal limits. Other: Previously identified percutaneous drainage catheter in the anterior left abdomen has been pulled back in the interval. The distal catheter tip is in the deep subcutaneous soft tissues of the anterior abdominal wall. Previously identified left lateral percutaneous drainage catheter is unchanged in position. The distal portion of the catheter is in a new lobulated enhancing fluid collection containing air abutting the inflamed portion of the colon. This collection measures 8.5 x 3.5 x 10.0 cm and is now within the adjacent iliopsoas musculature and abutting the left iliacus musculature. This fluid collection also extends through the left lateral abdominal wall on image 4/51, a new finding. There is trace free fluid in the pelvis. There is no focal abdominal wall hernia. Musculoskeletal: No acute or significant osseous findings. Review of the MIP images confirms the above findings. IMPRESSION: 1. Again seen are findings compatible with descending colon diverticulitis with perforation. Free air and inflammation have increased in the interval. 2. New lobulated enhancing fluid collection  posterior to this segment of inflamed colon measuring 8.5 x 3.5 x 10.0 cm. This collection now invades the adjacent iliopsoas muscle as well as extends through the left lateral abdominal wall. The tip of the drainage catheter is in this collection. Findings are compatible with abscess. 3. Anterior left percutaneous drainage catheter tip has been pulled back and is now within the subcutaneous tissues. 4. Trace free fluid. 5. No pulmonary embolism.  No acute cardiopulmonary process. 6. Cholelithiasis. 7. Fatty infiltration of the liver. Electronically Signed   By: Ronney Asters M.D.   On: 04/10/2021 19:01   DG Chest Portable 1 View  Result Date: 04/10/2021 CLINICAL DATA:  Weakness. EXAM: PORTABLE CHEST 1 VIEW COMPARISON:  March 31, 2016 FINDINGS: The heart size and mediastinal contours are within normal limits. Both lungs are clear. The visualized skeletal structures are unremarkable. IMPRESSION: No active disease. Electronically Signed   By: Virgina Norfolk M.D.   On: 04/10/2021 15:51    Anti-infectives: Anti-infectives (From admission, onward)    Start     Dose/Rate Route Frequency Ordered Stop   04/10/21 2200  cefTRIAXone (ROCEPHIN) 2 g in sodium chloride 0.9 % 100 mL IVPB        2 g 200 mL/hr over 30 Minutes Intravenous Daily at bedtime 04/10/21 2044     04/10/21 2200  metroNIDAZOLE (FLAGYL) IVPB 500 mg        500 mg 100 mL/hr over 60 Minutes Intravenous Every 12 hours 04/10/21 2044         Assessment/Plan: s/p * No surgery found *   Complex diverticulitis with abscesses.  One drain removed. Other drain will be addressed tomorrow in terms of possible revision. Iv antibiotics. She will end up needing surgery, main issue is timing.  She would like to avoid ostomy, but this is becoming less likely if  these abscesses do not resolve with percutaneous approach.    FEN: NPO/ ID rocephin/flagyl 11/5>> (Previous outpatient oral antibiotics) VTE: LMWH   LOS: 2 days    Stark Klein 04/12/2021

## 2021-04-12 NOTE — Progress Notes (Signed)
Assessed both arms with ultrasound for Midline placement.Veins are tiny and too deep to access for Midline. No suitable vein found to attempt for Midline or for peripheral IV. Recommend PICC or Central line placement for IV access

## 2021-04-13 ENCOUNTER — Inpatient Hospital Stay (HOSPITAL_COMMUNITY): Payer: BC Managed Care – PPO

## 2021-04-13 DIAGNOSIS — K5720 Diverticulitis of large intestine with perforation and abscess without bleeding: Secondary | ICD-10-CM | POA: Diagnosis not present

## 2021-04-13 DIAGNOSIS — I10 Essential (primary) hypertension: Secondary | ICD-10-CM | POA: Diagnosis not present

## 2021-04-13 DIAGNOSIS — E876 Hypokalemia: Secondary | ICD-10-CM | POA: Diagnosis not present

## 2021-04-13 DIAGNOSIS — R000 Tachycardia, unspecified: Secondary | ICD-10-CM | POA: Diagnosis not present

## 2021-04-13 HISTORY — PX: IR CATHETER TUBE CHANGE: IMG717

## 2021-04-13 LAB — CBC
HCT: 26 % — ABNORMAL LOW (ref 36.0–46.0)
Hemoglobin: 8.5 g/dL — ABNORMAL LOW (ref 12.0–15.0)
MCH: 28.7 pg (ref 26.0–34.0)
MCHC: 32.7 g/dL (ref 30.0–36.0)
MCV: 87.8 fL (ref 80.0–100.0)
Platelets: 514 10*3/uL — ABNORMAL HIGH (ref 150–400)
RBC: 2.96 MIL/uL — ABNORMAL LOW (ref 3.87–5.11)
RDW: 21.6 % — ABNORMAL HIGH (ref 11.5–15.5)
WBC: 16.1 10*3/uL — ABNORMAL HIGH (ref 4.0–10.5)
nRBC: 0 % (ref 0.0–0.2)

## 2021-04-13 LAB — AEROBIC/ANAEROBIC CULTURE W GRAM STAIN (SURGICAL/DEEP WOUND): Gram Stain: NONE SEEN

## 2021-04-13 LAB — BASIC METABOLIC PANEL
Anion gap: 5 (ref 5–15)
BUN: 11 mg/dL (ref 6–20)
CO2: 26 mmol/L (ref 22–32)
Calcium: 7.2 mg/dL — ABNORMAL LOW (ref 8.9–10.3)
Chloride: 105 mmol/L (ref 98–111)
Creatinine, Ser: 0.45 mg/dL (ref 0.44–1.00)
GFR, Estimated: >60 mL/min (ref >60)
Glucose, Bld: 82 mg/dL (ref 70–99)
Potassium: 3.5 mmol/L (ref 3.5–5.1)
Sodium: 136 mmol/L (ref 135–145)

## 2021-04-13 LAB — URINE CULTURE: Culture: 70000 — AB

## 2021-04-13 MED ORDER — METOPROLOL TARTRATE 5 MG/5ML IV SOLN: 2.5000 mg | Freq: Three times a day (TID) | INTRAVENOUS | Status: DC | PRN

## 2021-04-13 MED ORDER — OXYCODONE HCL 5 MG PO TABS
5.0000 mg | ORAL_TABLET | ORAL | Status: DC | PRN
  Administered 2021-04-14 (×2): 5 mg via ORAL
  Filled 2021-04-13 (×4): qty 1

## 2021-04-13 MED ORDER — SODIUM CHLORIDE 0.9 % IV SOLN
1.0000 g | Freq: Three times a day (TID) | INTRAVENOUS | Status: DC
  Administered 2021-04-13 – 2021-04-17 (×12): 1 g via INTRAVENOUS
  Filled 2021-04-13 (×13): qty 1

## 2021-04-13 MED ORDER — PROSOURCE PLUS PO LIQD
30.0000 mL | Freq: Every day | ORAL | Status: DC
  Administered 2021-04-13 – 2021-04-14 (×2): 30 mL via ORAL
  Filled 2021-04-13 (×2): qty 30

## 2021-04-13 MED ORDER — ADULT MULTIVITAMIN W/MINERALS CH
1.0000 | ORAL_TABLET | Freq: Every day | ORAL | Status: DC
  Administered 2021-04-13 – 2021-04-14 (×2): 1 via ORAL
  Filled 2021-04-13 (×2): qty 1

## 2021-04-13 MED ORDER — LIDOCAINE HCL 1 % IJ SOLN
INTRAMUSCULAR | Status: AC
  Filled 2021-04-13: qty 20

## 2021-04-13 MED ORDER — ENSURE ENLIVE PO LIQD: 237.0000 mL | ORAL | Status: DC

## 2021-04-13 MED ORDER — BOOST / RESOURCE BREEZE PO LIQD CUSTOM
1.0000 | Freq: Two times a day (BID) | ORAL | Status: DC
  Administered 2021-04-13 – 2021-04-14 (×2): 1 via ORAL

## 2021-04-13 MED ORDER — IOHEXOL 300 MG/ML  SOLN
15.0000 mL | Freq: Once | INTRAMUSCULAR | Status: AC | PRN
  Administered 2021-04-13: 10 mL

## 2021-04-13 MED ORDER — METOPROLOL SUCCINATE ER 50 MG PO TB24
50.0000 mg | ORAL_TABLET | Freq: Two times a day (BID) | ORAL | Status: DC
  Administered 2021-04-14 – 2021-04-15 (×3): 50 mg via ORAL
  Filled 2021-04-13 (×4): qty 1

## 2021-04-13 NOTE — Progress Notes (Signed)
Inpatient Rehab Admissions Coordinator Note:   Per PT/OT patient was screened for CIR candidacy by Chloris Marcoux Danford Bad, CCC-SLP. At this time, pt appears to be a potential candidate for CIR. I will place an order for rehab consult for full assessment, per our protocol.  Please contact me any with questions.Gayland Curry, [place], [place] Admissions Coordinator [phone] 04/13/21 1:39 PM

## 2021-04-13 NOTE — Procedures (Signed)
Vascular and Interventional Radiology Procedure Note  Patient: Mikayla Rios DOB: January 19, 1986 Medical Record Number: [id number] Note Date/Time: 04/13/21 5:04 PM   Performing Physician: Michaelle Birks, MD Assistant(s): None  Diagnosis: Malfunction of Indwelling of drainage catheter.  Procedure:  DRAIN EVALUATION (SINOGRAM) and EXCHANGE  Anesthesia: Local Anesthetic Complications: None Estimated Blood Loss:  0 mL Specimens: None  Findings:  There is residual cavity around the pigtail portion of the drainage tube. Therefore, the drainage tube was exchanged and upsized to a 65F, from a 76F.Marland Kitchen  Plan: Flush with saline, with frequency as recommended. and Follow up repeat drain evaluation in 2 weeks.  See detailed procedure note with images in PACS. The patient tolerated the procedure well without incident or complication and was returned to Floor Bed in stable condition.    Michaelle Birks, MD [facility] Radiology   Pager. [place]

## 2021-04-13 NOTE — TOC Progression Note (Signed)
Transition of Care [facility]) - Progression Note    Patient Details  Name: Mikayla Rios MRN: [id number] Date of Birth: 03/28/1986  [facility] CM/SW Contact  Leeroy Cha, RN Phone Number: 04/13/2021, 12:43 PM  Clinical Narrative:    Pt's fl2 faxed out to surrounding areas for review. PASSAR Number=[phone] A Ssn:[phone]  Expected Discharge Plan: Home/Self Care Barriers to Discharge: Continued Medical Work up  Expected Discharge Plan and Services Expected Discharge Plan: Home/Self Care   Discharge Planning Services: CM Consult   Living arrangements for the past 2 months: Apartment                                       Social Determinants of Health (SDOH) Interventions    Readmission Risk Interventions No flowsheet data found.

## 2021-04-13 NOTE — Progress Notes (Signed)
Pharmacy Antibiotic Note  Mikayla Rios is a 35 y.o. female with a h/o complicated diverticulitis with abscess s/p drain placement admitted on 04/10/2021 with weakness.  Pharmacy has been consulted for meropenem dosing for IAI with patient previously on ceftriaxone and metronidazole.  Plan: Meropenem 1 g iv q 8 hours  Will sign off and follow remotely  Height: 5\' 3"  (160 cm) Weight: 90.7 kg (200 lb) IBW/kg (Calculated) : 52.4  Temp (24hrs), Avg:98.4 F (36.9 C), Min:97.8 F (36.6 C), Max:99.6 F (37.6 C)  Recent Labs  Lab 04/10/21 1446 04/10/21 1525 04/11/21 0302 04/12/21 0525 04/12/21 1235 04/13/21 0546  WBC 14.1*  --  15.4* 8.8  --  16.1*  CREATININE 0.46  --  0.49 0.40*  --  0.45  LATICACIDVEN  --  1.5  --   --  1.5  --     Estimated Creatinine Clearance: 102.9 mL/min (by C-G formula based on SCr of 0.45 mg/dL).    Allergies  Allergen Reactions   Shellfish Allergy Hives   Penicillins Hives and Rash    Has patient had a PCN reaction causing immediate rash, facial/tongue/throat swelling, SOB or lightheadedness with hypotension: No Has patient had a PCN reaction causing severe rash involving mucus membranes or skin necrosis: hives Has patient had a PCN reaction that required hospitalization No Has patient had a PCN reaction occurring within the last 10 years: yes If all of the above answers are "NO", then may proceed with Cephalosporin use.     Thank you for allowing pharmacy to be a part of this patient's care.  Ulice Dash D 04/13/2021 8:00 AM

## 2021-04-13 NOTE — Progress Notes (Signed)
PROGRESS NOTE    Mikayla Rios  IPJ:[id number] DOB: Apr 18, 1986 DOA: 04/10/2021 PCP: [name], MD    Chief Complaint  Patient presents with   Weakness   Tachycardia    Brief Narrative:   35 year old female with past medical history of hypertension, inappropriate sinus tachycardia, obesity, obstructive sleep apnea and diverticulosis with complicated diverticulitis and abscess formation 11/2020 status post IR guided placement of 2 left lower quadrant drains who presents to [facility] emergency department with complaints of generalized weakness and lightheadedness. She was found to have new abscess measuring about 8.5 times 10 cm extending into the left lateral abd wall.   Assessment & Plan:   Principal Problem:   Diverticulitis of large intestine with abscess Active Problems:   Class 3 severe obesity due to excess calories with serious comorbidity and body mass index (BMI) of 60.0 to 69.9 in adult [facility])   Obstructive sleep apnea   Anemia   Inappropriate sinus tachycardia   Essential hypertension   SIRS (systemic inflammatory response syndrome) (HCC)   Hypokalemia, inadequate intake   Protein-calorie malnutrition, severe (HCC)   Reactive thrombocytosis   Prolonged QT interval   Complicated diverticulitis with abscess s/p drain placement by IR twice in June 2022 with numerous exchanges since then due to persistent fistulous -CT of the abdomen and pelvis New lobulated enhancing fluid collection posterior to this segment of inflamed colon measuring 8.5 x 3.5 x 10.0 cm. This collection now invades the adjacent iliopsoas muscle as well as extends through the left lateral abdominal wall. -CT imaging today shows the anterior drain has been pulled back into subcutaneous tissue and hence it was removed by IR at bedside. The left lateral drain is within a large collection measuring about 8.5 into 10 cm. Patient reports no output.  It was flushed by IR and aspirated with return  of purulent and foul-smelling material and the drain was connected to JP bulb. Plan for drain exchange/upsize on Monday by IR Meanwhile patient is on IV Rocephin and Flagyl for intra-abdominal infection. General surgery on board and suggest that she will need surgery, and ostomy if the abscesses do not resolve with per cutaneous approach.  Remains afebrile but with persistent leukocytosis. Cultures from the drain show E coli, sensitive to Meropenem.  Will request GI consult when her acute issues improve for possible colonoscopy.     Pyuria Urine cultures ordered, growing e coli,  and patient already started on antibiotics.   Patient reports dizziness and an episode of syncope and an episode of mechanical fall possibly from orthostatic hypotension.  Monitor on telemetry. CT angiogram of the chest ruled out PE EKG shows prolonged QT interval 636.  Nonspecific T wave inversions.troponin is negative. Patient denies any chest pain or shortness of breath. Probably secondary to orthostatic hypotension from infection. Get orthostatic vital signs tomorrow and therapy evaluations will be ordered.   Hypokalemia Replaced,.   Prolonged QTc: - from hypokalemia and hypomagnesemia. - K is 4.3. magnesium is 1.8. Keep k >4 and mag>2.   Repeat EKG shows improvement in the qtC.    Anemia of chronic disease:/ acute anemia of acute illness - normocytic.  - anemia panel  Shows low folate levels.  Drop in hemoglobin from 8.4 to 7.1.  1 unit of prbc transfusion ordered.  Hemoglobin improved to 8.5.    Thrombocytosis:  Possibly from the infection.    Hypoalbuminemia:  - delaying the infection.  - will get dietary consult.    Inappropriate tachycardia:  Pt not symptomatic.  Echocardiogram ordered and pending.  Increase metoprolol to 50 mg BID.    Hypertension:  Well controlled.   Leukocytosis:  Probably in the infection.    DVT prophylaxis: scd's Code Status: (Full code) Family  Communication: NONE AT BEDSIDE Disposition:   Status is: Inpatient  Remains inpatient appropriate because: IV ANTIBIOTICS       Consultants:  Surgery IR  Procedures: none.   Antimicrobials:  Antibiotics Given (last 72 hours)     Date/Time Action Medication Dose Rate   04/10/21 2220 New Bag/Given   cefTRIAXone (ROCEPHIN) 2 g in sodium chloride 0.9 % 100 mL IVPB 2 g 200 mL/hr   04/10/21 2233 New Bag/Given   metroNIDAZOLE (FLAGYL) IVPB 500 mg 500 mg 100 mL/hr   04/11/21 1021 New Bag/Given   metroNIDAZOLE (FLAGYL) IVPB 500 mg 500 mg 100 mL/hr   04/11/21 2101 New Bag/Given   cefTRIAXone (ROCEPHIN) 2 g in sodium chloride 0.9 % 100 mL IVPB 2 g 200 mL/hr   04/11/21 2213 New Bag/Given   metroNIDAZOLE (FLAGYL) IVPB 500 mg 500 mg 100 mL/hr   04/12/21 7124 New Bag/Given   metroNIDAZOLE (FLAGYL) IVPB 500 mg 500 mg 100 mL/hr   04/12/21 2326 New Bag/Given   cefTRIAXone (ROCEPHIN) 2 g in sodium chloride 0.9 % 100 mL IVPB 2 g 200 mL/hr   04/13/21 0012 New Bag/Given   metroNIDAZOLE (FLAGYL) IVPB 500 mg 500 mg 100 mL/hr   04/13/21 1121 New Bag/Given   meropenem (MERREM) 1 g in sodium chloride 0.9 % 100 mL IVPB 1 g 200 mL/hr         Subjective: No diarrhea, abd discomfort, no chest pain or sob. No nausea, or vomiting.   Objective: Vitals:   04/13/21 0133 04/13/21 0907 04/13/21 1148 04/13/21 1508  BP: 116/90 115/86 (!) 125/100 136/83  Pulse: 95 (!) 112 (!) 116 (!) 121  Resp: 20  16 16   Temp: 97.8 F (36.6 C)  98.6 F (37 C) 98.8 F (37.1 C)  TempSrc: Oral  Oral Oral  SpO2: 100%  100% 100%  Weight:      Height:        Intake/Output Summary (Last 24 hours) at 04/13/2021 1608 Last data filed at 04/13/2021 0258 Gross per 24 hour  Intake 1557 ml  Output 60 ml  Net 1497 ml    Filed Weights   04/10/21 1436  Weight: 90.7 kg    Examination:  General exam: Appears calm and comfortable  Respiratory system: Clear to auscultation. Respiratory effort normal. Cardiovascular  system: S1 & S2 heard, RRR. No JVD,  No pedal edema. Gastrointestinal system: Abdomen is soft, distended, and mildly tender , IR  drain in the left mid abd, with clear brown liquid. Normal bowel sounds heard. Central nervous system: Alert and oriented. No focal neurological deficits. Extremities: Symmetric 5 x 5 power. Skin: No rashes, lesions or ulcers Psychiatry:  Mood & affect appropriate.       Data Reviewed: I have personally reviewed following labs and imaging studies  CBC: Recent Labs  Lab 04/10/21 1446 04/11/21 0302 04/12/21 0525 04/13/21 0546  WBC 14.1* 15.4* 8.8 16.1*  NEUTROABS 12.2*  --   --   --   HGB 8.4* 8.4* 7.1* 8.5*  HCT 27.3* 26.8* 22.7* 26.0*  MCV 89.2 87.9 89.7 87.8  PLT 777* 696* 433* 514*     Basic Metabolic Panel: Recent Labs  Lab 04/10/21 1446 04/10/21 1707 04/11/21 0302 04/12/21 0525 04/13/21 0546  NA 135  --  135 136 136  K 2.7*  --  4.3 3.8 3.5  CL 97*  --  100 103 105  CO2 27  --  25 26 26   GLUCOSE 84  --  82 81 82  BUN 17  --  16 12 11   CREATININE 0.46  --  0.49 0.40* 0.45  CALCIUM 7.5*  --  7.6* 7.3* 7.2*  MG  --  1.9 1.8  --   --      GFR: Estimated Creatinine Clearance: 102.9 mL/min (by C-G formula based on SCr of 0.45 mg/dL).  Liver Function Tests: Recent Labs  Lab 04/10/21 1446 04/11/21 0302  AST 16 16  ALT 19 19  ALKPHOS 143* 147*  BILITOT 0.6 0.7  PROT 6.2* 6.2*  ALBUMIN 1.6* 1.7*     CBG: No results for input(s): GLUCAP in the last 168 hours.   Recent Results (from the past 240 hour(s))  Resp Panel by RT-PCR (Flu A&B, Covid) Nasopharyngeal Swab     Status: None   Collection Time: 04/10/21  2:46 PM   Specimen: Nasopharyngeal Swab; Nasopharyngeal(NP) swabs in vial transport medium  Result Value Ref Range Status   SARS Coronavirus 2 by RT PCR NEGATIVE NEGATIVE Final    Comment: (NOTE) SARS-CoV-2 target nucleic acids are NOT DETECTED.  The SARS-CoV-2 RNA is generally detectable in upper  respiratory specimens during the acute phase of infection. The lowest concentration of SARS-CoV-2 viral copies this assay can detect is 138 copies/mL. A negative result does not preclude SARS-Cov-2 infection and should not be used as the sole basis for treatment or other patient management decisions. A negative result may occur with  improper specimen collection/handling, submission of specimen other than nasopharyngeal swab, presence of viral mutation(s) within the areas targeted by this assay, and inadequate number of viral copies(<138 copies/mL). A negative result must be combined with clinical observations, patient history, and epidemiological information. The expected result is Negative.  Fact Sheet for Patients:  EntrepreneurPulse.com.au  Fact Sheet for Healthcare Providers:  IncredibleEmployment.be  This test is no t yet approved or cleared by the [place] FDA and  has been authorized for detection and/or diagnosis of SARS-CoV-2 by FDA under an Emergency Use Authorization (EUA). This EUA will remain  in effect (meaning this test can be used) for the duration of the COVID-19 declaration under Section 564(b)(1) of the Act, 21 U.S.C.section 360bbb-3(b)(1), unless the authorization is terminated  or revoked sooner.       Influenza A by PCR NEGATIVE NEGATIVE Final   Influenza B by PCR NEGATIVE NEGATIVE Final    Comment: (NOTE) The Xpert Xpress SARS-CoV-2/FLU/RSV plus assay is intended as an aid in the diagnosis of influenza from Nasopharyngeal swab specimens and should not be used as a sole basis for treatment. Nasal washings and aspirates are unacceptable for Xpert Xpress SARS-CoV-2/FLU/RSV testing.  Fact Sheet for Patients: EntrepreneurPulse.com.au  Fact Sheet for Healthcare Providers: IncredibleEmployment.be  This test is not yet approved or cleared by the [place] FDA and has been  authorized for detection and/or diagnosis of SARS-CoV-2 by FDA under an Emergency Use Authorization (EUA). This EUA will remain in effect (meaning this test can be used) for the duration of the COVID-19 declaration under Section 564(b)(1) of the Act, 21 U.S.C. section 360bbb-3(b)(1), unless the authorization is terminated or revoked.  Performed at [facility], [place] [address] [place], [place] [address]   Blood culture (routine x 2)     Status: None (Preliminary result)  Collection Time: 04/10/21  4:42 PM   Specimen: BLOOD  Result Value Ref Range Status   Specimen Description   Final    BLOOD RIGHT ANTECUBITAL Performed at [facility] [address], [place], [place] [address]    Special Requests   Final    BOTTLES DRAWN AEROBIC AND ANAEROBIC Blood Culture adequate volume Performed at [facility] [address], [place], [place] [address]    Culture   Final    NO GROWTH 3 DAYS Performed at [facility], [place] [address], [place], [place] [address]    Report Status PENDING  Incomplete  Aerobic/Anaerobic Culture w Gram Stain (surgical/deep wound)     Status: None   Collection Time: 04/10/21  7:56 PM   Specimen: Abscess  Result Value Ref Range Status   Specimen Description   Final    ABSCESS Performed at [facility] [address] [place], [place] [address]    Special Requests   Final    NONE Performed at [facility], [place] [address] [place], [place] [address]    Gram Stain   Final    NO SQUAMOUS EPITHELIAL CELLS SEEN FEW WBC SEEN FEW GRAM POSITIVE COCCI    Culture   Final    FEW ESCHERICHIA COLI FEW ACTINOMYCES ODONTOLYTICUS Standardized susceptibility testing for this organism is not available. MODERATE BACTEROIDES OVATUS BETA LACTAMASE POSITIVE Performed at [facility], [place] [address], [place], [place] [address]    Report Status 04/13/2021 FINAL  Final    Organism ID, Bacteria ESCHERICHIA COLI  Final      Susceptibility   Escherichia coli - MIC*    AMPICILLIN >=32 RESISTANT Resistant     CEFAZOLIN 8 SENSITIVE Sensitive     CEFEPIME <=0.12 SENSITIVE Sensitive     CEFTAZIDIME <=1 SENSITIVE Sensitive     CEFTRIAXONE <=0.25 SENSITIVE Sensitive     CIPROFLOXACIN >=4 RESISTANT Resistant     GENTAMICIN <=1 SENSITIVE Sensitive     IMIPENEM <=0.25 SENSITIVE Sensitive     TRIMETH/SULFA <=20 SENSITIVE Sensitive     AMPICILLIN/SULBACTAM >=32 RESISTANT Resistant     PIP/TAZO <=4 SENSITIVE Sensitive     * FEW ESCHERICHIA COLI  Urine Culture     Status: Abnormal   Collection Time: 04/11/21  1:09 PM   Specimen: Urine, Clean Catch  Result Value Ref Range Status   Specimen Description   Final    URINE, CLEAN CATCH Performed at [facility], [place] [address] [place], [place] [address]    Special Requests   Final    NONE Performed at [facility], [place] [address] [place], [place] [address]    Culture 70,000 COLONIES/mL ESCHERICHIA COLI (A)  Final   Report Status 04/13/2021 FINAL  Final   Organism ID, Bacteria ESCHERICHIA COLI (A)  Final      Susceptibility   Escherichia coli - MIC*    AMPICILLIN >=32 RESISTANT Resistant     CEFAZOLIN 8 SENSITIVE Sensitive     CEFEPIME <=0.12 SENSITIVE Sensitive     CEFTRIAXONE <=0.25 SENSITIVE Sensitive     CIPROFLOXACIN >=4 RESISTANT Resistant     GENTAMICIN <=1 SENSITIVE Sensitive     IMIPENEM <=0.25 SENSITIVE Sensitive     NITROFURANTOIN <=16 SENSITIVE Sensitive     TRIMETH/SULFA <=20 SENSITIVE Sensitive     AMPICILLIN/SULBACTAM >=32 RESISTANT Resistant     PIP/TAZO <=4 SENSITIVE Sensitive     * 70,000 COLONIES/mL ESCHERICHIA  COLI          Radiology Studies: DG CHEST PORT 1 VIEW  Result Date: 04/12/2021 CLINICAL DATA:  PICC placement EXAM: PORTABLE CHEST 1 VIEW COMPARISON:  04/10/2021 FINDINGS: Right arm PICC tip in the proximal SVC. Lungs remain clear  without infiltrate or effusion. IMPRESSION: PICC tip in the proximal SVC.  Lungs are clear Electronically Signed   By: Franchot Gallo M.D.   On: 04/12/2021 19:55   [place] EKG SITE RITE  Result Date: 04/12/2021 If Site Rite image not attached, placement could not be confirmed due to current cardiac rhythm.       Scheduled Meds:  (feeding supplement) PROSource Plus  30 mL Oral Daily   Chlorhexidine Gluconate Cloth  6 each Topical Daily   enoxaparin (LOVENOX) injection  40 mg Subcutaneous Q24H   feeding supplement  1 Container Oral BID BM   [START ON 04/14/2021] feeding supplement  237 mL Oral Y85O   folic acid  1 mg Oral Daily   lidocaine       metoprolol succinate  50 mg Oral BID   multivitamin with minerals  1 tablet Oral Daily   sodium chloride flush  10 mL Intracatheter Q12H   sodium chloride flush  10-40 mL Intracatheter Q12H   Continuous Infusions:  lactated ringers 75 mL/hr at 04/12/21 0602   meropenem (MERREM) IV 1 g (04/13/21 1121)     LOS: 3 days        Hosie Poisson, MD [facility]   To contact the attending provider between 7A-7P or the covering provider during after hours 7P-7A, please log into the web site [email or web address] and access using universal [place] password for that web site. If you do not have the password, please call the hospital operator.  04/13/2021, 4:08 PM

## 2021-04-13 NOTE — TOC Initial Note (Signed)
Transition of Care [facility]) - Initial/Assessment Note    Patient Details  Name: Mikayla Rios MRN: [id number] Date of Birth: 02/19/1986  Transition of Care [facility]) CM/SW Contact:    Leeroy Cha, RN Phone Number: 04/13/2021, 7:35 AM  Clinical Narrative:                 35 year old female with past medical history of hypertension, inappropriate sinus tachycardia, obesity, obstructive sleep apnea and diverticulosis with complicated diverticulitis and abscess formation 11/2020 status post IR guided placement of 2 left lower quadrant drains who presents to [facility] emergency department with complaints of generalized weakness and lightheadedness.   Assessment & Plan:   Principal Problem:   Diverticulitis of large intestine with abscess Active Problems:   Obstructive sleep apnea   Anemia   Inappropriate sinus tachycardia   Essential hypertension   SIRS (systemic inflammatory response syndrome) (HCC)   Hypokalemia, inadequate intake   Protein-calorie malnutrition, severe (HCC)   Reactive thrombocytosis   Prolonged QT interval     Complicated diverticulitis with abscess s/p drain placement by IR twice in June 2022 with numerous exchanges since then due to persistent fistulous -CT of the abdomen and pelvis New lobulated enhancing fluid collection posterior to this segment of inflamed colon measuring 8.5 x 3.5 x 10.0 cm. This collection now invades the adjacent iliopsoas muscle as well as extends through the left lateral abdominal wall. -CT imaging today shows the anterior drain has been pulled back into subcutaneous tissue and hence it was removed by IR at bedside. The left lateral drain is within a large collection measuring about 8.5 into 10 cm. Patient reports no output.  It was flushed by IR and aspirated with return of purulent and foul-smelling material and the drain was connected to JP bulb. Plan for drain exchange/upsize on Monday by IR Meanwhile patient is on IV  Rocephin and Flagyl for intra-abdominal infection. General surgery on board and suggest that she will need surgery, and ostomy if the abscesses do not resolve with per cutaneous approach.  Remains afebrile but with persistent leukocytosis. Cultures from the drain sent for analysis.  TOC PLAN OF CARE Will followfor toc home needs Progression: wbc-16.1,hgb 8.5 urine culture=E.Coli Iv rocephin, flagyl, lr at 75cc/hr.  Expected Discharge Plan: Home/Self Care Barriers to Discharge: Continued Medical Work up   Patient Goals and CMS Choice Patient states their goals for this hospitalization and ongoing recovery are:: to go home with my family CMS Medicare.gov Compare Post Acute Care list provided to:: Patient    Expected Discharge Plan and Services Expected Discharge Plan: Home/Self Care   Discharge Planning Services: CM Consult   Living arrangements for the past 2 months: Apartment                                      Prior Living Arrangements/Services Living arrangements for the past 2 months: Apartment Lives with:: Spouse Patient language and need for interpreter reviewed:: Yes Do you feel safe going back to the place where you live?: Yes            Criminal Activity/Legal Involvement Pertinent to Current Situation/Hospitalization: No - Comment as needed  Activities of Daily Living Home Assistive Devices/Equipment: CPAP (has cpap but doesn't use it) ADL Screening (condition at time of admission) Patient's cognitive ability adequate to safely complete daily activities?: Yes Is the patient deaf or have difficulty hearing?: No  Does the patient have difficulty seeing, even when wearing glasses/contacts?: No Does the patient have difficulty concentrating, remembering, or making decisions?: No Patient able to express need for assistance with ADLs?: Yes Does the patient have difficulty dressing or bathing?: Yes Independently performs ADLs?: No Communication:  Independent Dressing (OT): Needs assistance Is this a change from baseline?: Pre-admission baseline Grooming: Independent Feeding: Independent Bathing: Needs assistance Is this a change from baseline?: Pre-admission baseline Toileting: Needs assistance Is this a change from baseline?: Pre-admission baseline In/Out Bed: Needs assistance Is this a change from baseline?: Pre-admission baseline Walks in Home: Needs assistance Is this a change from baseline?: Pre-admission baseline Does the patient have difficulty walking or climbing stairs?: Yes Weakness of Legs: Both Weakness of Arms/Hands: Both  Permission Sought/Granted                  Emotional Assessment Appearance:: Appears stated age Attitude/Demeanor/Rapport: Engaged Affect (typically observed): Calm Orientation: : Oriented to Place, Oriented to Self, Oriented to  Time, Oriented to Situation Alcohol / Substance Use: Not Applicable Psych Involvement: No (comment)  Admission diagnosis:  Diverticulitis [K57.92] Intestinal diverticular abscess [K63.0] Patient Active Problem List   Diagnosis Date Noted   Diverticulitis of large intestine with abscess 04/10/2021   Essential hypertension 04/10/2021   SIRS (systemic inflammatory response syndrome) ([place]) 04/10/2021   Hypokalemia, inadequate intake 04/10/2021   Protein-calorie malnutrition, severe ([place]) 04/10/2021   Reactive thrombocytosis 04/10/2021   Prolonged QT interval 04/10/2021   Diverticulitis of colon with perforation 11/04/2020   Anemia 07/11/2019   Inappropriate sinus tachycardia 07/11/2019   Obstructive sleep apnea 11/27/2018   Class 3 severe obesity due to excess calories with serious comorbidity and body mass index (BMI) of 60.0 to 69.9 in adult [facility])    SVD (spontaneous vaginal delivery) 12/16/2012   PCP:  [name], MD Pharmacy:   [id number] - [place], [place] [address] [place] [address] Phone: [phone] Fax: [phone]     Social Determinants of Health (SDOH) Interventions    Readmission Risk Interventions No flowsheet data found.

## 2021-04-13 NOTE — Progress Notes (Incomplete)
Echocardiogram not completed, patient is undergoing other testing. Will re-attempt at another time.  Darlina Sicilian RDCS

## 2021-04-13 NOTE — Progress Notes (Addendum)
Subjective: CC: Yesterday patient reports increased crampy abdominal pain that goes from her right to left abdomen.  This improved after she had a BM that was formed this AM.  She feels like she gets crampy pain like this after certain p.o. intake but is unsure exactly what foods these are.  She is tolerating a regular diet and finishing half of her trays without nausea or vomiting. Her pain this morning seems to be localized to the left lower quadrant and is moderate in severity.   Objective: Vital signs in last 24 hours: Temp:  [97.8 F (36.6 C)-99.6 F (37.6 C)] 98.6 F (37 C) (11/07 1148) Pulse Rate:  [95-116] 116 (11/07 1148) Resp:  [16-20] 16 (11/07 1148) BP: (113-133)/(71-100) 125/100 (11/07 1148) SpO2:  [100 %] 100 % (11/07 1148) Last BM Date: 04/12/21  Intake/Output from previous day: 11/06 0701 - 11/07 0700 In: 2416.8 [P.O.:720; I.V.:399.8; IV Piggyback:1297] Out: 160 [Urine:100; Drains:60] Intake/Output this shift: No intake/output data recorded.  PE: Gen:  Alert, NAD, pleasant Card: Tachycardic Pulm:  Normal rate and effort Abd: Soft, obese but appears ND. Tenderness of the left mid to lower abdomen without rigidity or guarding. +BS. LLQ drain with purulent output.  Psych: A&Ox3 Skin: no rashes noted, warm and dry  Lab Results:  Recent Labs    04/12/21 0525 04/13/21 0546  WBC 8.8 16.1*  HGB 7.1* 8.5*  HCT 22.7* 26.0*  PLT 433* 514*   BMET Recent Labs    04/12/21 0525 04/13/21 0546  NA 136 136  K 3.8 3.5  CL 103 105  CO2 26 26  GLUCOSE 81 82  BUN 12 11  CREATININE 0.40* 0.45  CALCIUM 7.3* 7.2*   PT/INR Recent Labs    04/11/21 0302  LABPROT 15.1  INR 1.2   CMP     Component Value Date/Time   NA 136 04/13/2021 0546   NA 139 12/21/2019 1024   K 3.5 04/13/2021 0546   CL 105 04/13/2021 0546   CO2 26 04/13/2021 0546   GLUCOSE 82 04/13/2021 0546   BUN 11 04/13/2021 0546   BUN 13 12/21/2019 1024   CREATININE 0.45 04/13/2021 0546    CALCIUM 7.2 (L) 04/13/2021 0546   PROT 6.2 (L) 04/11/2021 0302   PROT 7.4 07/11/2019 1638   ALBUMIN 1.7 (L) 04/11/2021 0302   ALBUMIN 4.6 07/11/2019 1638   AST 16 04/11/2021 0302   ALT 19 04/11/2021 0302   ALKPHOS 147 (H) 04/11/2021 0302   BILITOT 0.7 04/11/2021 0302   BILITOT 0.2 07/11/2019 1638   GFRNONAA >60 04/13/2021 0546   GFRAA 96 12/21/2019 1024   Lipase     Component Value Date/Time   LIPASE 21 11/04/2020 1119    Studies/Results: DG CHEST PORT 1 VIEW  Result Date: 04/12/2021 CLINICAL DATA:  PICC placement EXAM: PORTABLE CHEST 1 VIEW COMPARISON:  04/10/2021 FINDINGS: Right arm PICC tip in the proximal SVC. Lungs remain clear without infiltrate or effusion. IMPRESSION: PICC tip in the proximal SVC.  Lungs are clear Electronically Signed   By: Franchot Gallo M.D.   On: 04/12/2021 19:55   [place] EKG SITE RITE  Result Date: 04/12/2021 If Site Rite image not attached, placement could not be confirmed due to current cardiac rhythm.   Anti-infectives: Anti-infectives (From admission, onward)    Start     Dose/Rate Route Frequency Ordered Stop   04/13/21 0900  meropenem (MERREM) 1 g in sodium chloride 0.9 % 100 mL IVPB  1 g 200 mL/hr over 30 Minutes Intravenous Every 8 hours 04/13/21 0800     04/10/21 2200  cefTRIAXone (ROCEPHIN) 2 g in sodium chloride 0.9 % 100 mL IVPB  Status:  Discontinued        2 g 200 mL/hr over 30 Minutes Intravenous Daily at bedtime 04/10/21 2044 04/13/21 0746   04/10/21 2200  metroNIDAZOLE (FLAGYL) IVPB 500 mg  Status:  Discontinued        500 mg 100 mL/hr over 60 Minutes Intravenous Every 12 hours 04/10/21 2044 04/13/21 0745        Assessment/Plan Complicated diverticulitis with abscess - Originally admitted from 5/31-6/6 with perforated diverticulitis with an abscess.  Underwent CT guided drain placement x2 on 6/3 and was discharged on cefdinir and flagyl based on culture results.  Follow-up CT scan did show direct communication between  drain and small bowel. She has had numerous drain exchanges. She had followed up with Dr. Marcello Moores in the office for this and was referred for colonoscopy to rule out concern for possible mass.  She has been seen by Dr. Lorenso Courier of GI but has not yet undergone colonoscopy -CT 11/4 showed descending colon diverticulitis with perforation and new fluid collection posterior to inflamed colon measuring 8.5 x 3.5 x 10 cm extending into the left lateral abdominal wall.  The tip of the drain catheter was in this collection.  IR plans to manipulate this drain today.  The anterior PERC drain was in the subcu tissues and was removed by IR on 11/5.  -Switch IV antibiotics to Merrem given rise in WBC and complicated diverticulitis picture  -Hopefully patient will improve with conservative therapy, however given the duration of if patients symptoms, new large fluid collection, prior direct communication between drain and small bowel, I suspect she may need exploratory surgery that would likely result in colostomy. We will follow along with you closely and see how patient improves after IR drain manipulation -Would recommend GI consultation to see if they can scope given concern for possible mass/malignancy in prior notes, however may not be able to in the setting of acute disease  FEN - NPO for IR eval VTE - SCDs, Lovenox ID - Merrem  Pyuria - ucx pending Tachycardia - TRH getting echo Syncope - TRH working up   LOS: 3 days    Jillyn Ledger , [facility] Surgery 04/13/2021, 11:59 AM Please see Amion for pager number during day hours 7:00am-4:30pm

## 2021-04-13 NOTE — NC FL2 (Signed)
[place] MEDICAID FL2 LEVEL OF CARE SCREENING TOOL     IDENTIFICATION  Patient Name: Mikayla Rios Birthdate: [birth year] Sex: female Admission Date (Current Location): 04/10/2021  [facility] and [place] Number:  Herbalist and Address:  [facility],  [place] [address] [place]      Provider Number: [id number]  Attending Physician Name and Address:  Hosie Poisson, MD  Relative Name and Phone Number:       Current Level of Care: Hospital Recommended Level of Care: [place] Prior Approval Number:    Date Approved/Denied:   PASRR Number: [id number]  Discharge Plan: SNF    Current Diagnoses: Patient Active Problem List   Diagnosis Date Noted   Diverticulitis of large intestine with abscess 04/10/2021   Essential hypertension 04/10/2021   SIRS (systemic inflammatory response syndrome) ([place]) 04/10/2021   Hypokalemia, inadequate intake 04/10/2021   Protein-calorie malnutrition, severe ([place]) 04/10/2021   Reactive thrombocytosis 04/10/2021   Prolonged QT interval 04/10/2021   Diverticulitis of colon with perforation 11/04/2020   Anemia 07/11/2019   Inappropriate sinus tachycardia 07/11/2019   Obstructive sleep apnea 11/27/2018   Class 3 severe obesity due to excess calories with serious comorbidity and body mass index (BMI) of 60.0 to 69.9 in adult [facility])    SVD (spontaneous vaginal delivery) 12/16/2012    Orientation RESPIRATION BLADDER Height & Weight     Self, Time, Situation, Place  Normal Continent Weight: 90.7 kg Height:  5\' 3"  (160 cm)  BEHAVIORAL SYMPTOMS/MOOD NEUROLOGICAL BOWEL NUTRITION STATUS      Continent Diet (regular)  AMBULATORY STATUS COMMUNICATION OF NEEDS Skin   Extensive Assist   Normal                       Personal Care Assistance Level of Assistance  Bathing, Dressing Bathing Assistance: Limited assistance         Functional Limitations Info  Sight Sight Info: Adequate        SPECIAL  CARE FACTORS FREQUENCY  PT (By licensed PT), OT (By licensed OT)     PT Frequency: 5 x weekly OT Frequency: 5 x weekly            Contractures Contractures Info: Not present    Additional Factors Info  Code Status Code Status Info: full             Current Medications (04/13/2021):  This is the current hospital active medication list Current Facility-Administered Medications  Medication Dose Route Frequency Provider Last Rate Last Admin   acetaminophen (TYLENOL) tablet 650 mg  650 mg Oral Q6H PRN Vernelle Emerald, MD   650 mg at 04/13/21 1660   Or   acetaminophen (TYLENOL) suppository 650 mg  650 mg Rectal Q6H PRN Shalhoub, Sherryll Burger, MD       Chlorhexidine Gluconate Cloth 2 % PADS 6 each  6 each Topical Daily Hosie Poisson, MD   6 each at 04/13/21 1122   dicyclomine (BENTYL) capsule 10 mg  10 mg Oral TID PRN Vernelle Emerald, MD       enoxaparin (LOVENOX) injection 40 mg  40 mg Subcutaneous Q24H Hosie Poisson, MD   40 mg at 63/01/60 1093   folic acid (FOLVITE) tablet 1 mg  1 mg Oral Daily Hosie Poisson, MD   1 mg at 04/13/21 2355   lactated ringers infusion   Intravenous Continuous Hosie Poisson, MD 75 mL/hr at 04/12/21 0602 New Bag at 04/12/21 0602  melatonin tablet 10 mg  10 mg Oral QHS PRN Shalhoub, Sherryll Burger, MD       meropenem (MERREM) 1 g in sodium chloride 0.9 % 100 mL IVPB  1 g Intravenous Q8H Hosie Poisson, MD 200 mL/hr at 04/13/21 1121 1 g at 04/13/21 1121   metoprolol succinate (TOPROL-XL) 24 hr tablet 25 mg  25 mg Oral BID Vernelle Emerald, MD   25 mg at 04/13/21 0907   morphine 2 MG/ML injection 2 mg  2 mg Intravenous Q4H PRN Vernelle Emerald, MD   2 mg at 04/12/21 2035   oxyCODONE (Oxy IR/ROXICODONE) immediate release tablet 5-10 mg  5-10 mg Oral Q4H PRN Maczis, Barth Kirks, PA-C       simethicone [facility]) chewable tablet 80 mg  80 mg Oral Daily PRN Vernelle Emerald, MD   80 mg at 04/12/21 1915   sodium chloride flush (NS) 0.9 % injection 10 mL  10 mL  Intracatheter Q12H C[place], Roselyn Reef R, NP   10 mL at 04/12/21 2327   sodium chloride flush (NS) 0.9 % injection 10-40 mL  10-40 mL Intracatheter Q12H Hosie Poisson, MD       sodium chloride flush (NS) 0.9 % injection 10-40 mL  10-40 mL Intracatheter PRN Hosie Poisson, MD         Discharge Medications: Please see discharge summary for a list of discharge medications.  Relevant Imaging Results:  Relevant Lab Results:   Additional Information DHR:[id number]  Leeroy Cha, RN

## 2021-04-13 NOTE — Progress Notes (Signed)
Initial Nutrition Assessment  DOCUMENTATION CODES:   Obesity unspecified  INTERVENTION:  - will order Boost Breeze BID, each supplement provides 250 kcal and 9 grams of protein. - will order Ensure Enlive once/day, each supplement provides 350 kcal and 20 grams of protein. - will order 30 ml Prosource Plus once/day, each supplement provides 100 kcal and 15 grams protein.  - will order 1 tablet multivitamin with minerals/day.  - complete NFPE when feasible.    NUTRITION DIAGNOSIS:   Inadequate oral intake related to acute illness as evidenced by per patient/family report.  GOAL:   Patient will meet greater than or equal to 90% of their needs  MONITOR:   PO intake, Supplement acceptance, Labs, Weight trends  REASON FOR ASSESSMENT:   Malnutrition Screening Tool, Consult Assessment of nutrition requirement/status  ASSESSMENT:   35 year old female with medical history of HTN, sinus tachycardia, obesity, anemia, OSA, and diverticulosis with complicated diverticulitis and abscess formation 11/2020 s/p IR guided placement of 2 left lower quadrant drains. She presented to the ED due to progressive generalized weakness x2 months and lightheadedness. She also reported poor oral intake PTA and gradual weight loss. She was seen by a Gastroenterologist on 10/6 with plan to undergo endoscopic work-up to rule out malignancy.  Patient out of the room to IR. She has not been seen by a [place] RD in the past.   Diet changed from CLD to NPO on 11/5 at midnight, advanced to Regular on 11/5 at 1604, and changed back to NPO today at 1014. She ate 50% of dinner on 11/5; 50% of breakfast and 75% of lunch on 11/6; 50% of breakfast today.   Weight on 11/4 was documented as 200 lb, which appears to be a stated weight. Weight on 12/12/20 was 249 lb. This would indicate 49 lb weight loss (20% body weight) in the past 4 months; significant for time frame.   Per notes: - complicated diverticulitis with  abscess with possible need for surgical intervention   Labs reviewed; Ca: 7.2 mg/dl. Medications reviewed; 1 mg folvite/day.  IVF; LR 2 75 ml/hr.     NUTRITION - FOCUSED PHYSICAL EXAM:  Unable to complete at this time.   Diet Order:   Diet Order             Diet NPO time specified  Diet effective now                   EDUCATION NEEDS:   Not appropriate for education at this time  Skin:  Skin Assessment: Skin Integrity Issues: Skin Integrity Issues:: Other (Comment) Other: open pressure sore vs MASD to L buttocks  Last BM:  11/7 (type 6 x1)  Height:   Ht Readings from Last 1 Encounters:  04/10/21 5\' 3"  (1.6 m)    Weight:   Wt Readings from Last 1 Encounters:  04/10/21 90.7 kg    Estimated Nutritional Needs:  Kcal:  1900-2100 kcal Protein:  95-110 grams Fluid:  >/= 2.2 L/day      Jarome Matin, MS, RD, LDN, CNSC Inpatient Clinical Dietitian RD pager # available in [place]  After hours/weekend pager # available in [facility]

## 2021-04-14 ENCOUNTER — Other Ambulatory Visit: Payer: Self-pay

## 2021-04-14 ENCOUNTER — Inpatient Hospital Stay (HOSPITAL_COMMUNITY): Payer: BC Managed Care – PPO

## 2021-04-14 ENCOUNTER — Ambulatory Visit: Payer: BC Managed Care – PPO | Admitting: Cardiology

## 2021-04-14 DIAGNOSIS — A419 Sepsis, unspecified organism: Secondary | ICD-10-CM | POA: Diagnosis not present

## 2021-04-14 DIAGNOSIS — I4729 Other ventricular tachycardia: Secondary | ICD-10-CM | POA: Diagnosis not present

## 2021-04-14 DIAGNOSIS — R000 Tachycardia, unspecified: Secondary | ICD-10-CM | POA: Diagnosis not present

## 2021-04-14 DIAGNOSIS — I10 Essential (primary) hypertension: Secondary | ICD-10-CM | POA: Diagnosis not present

## 2021-04-14 DIAGNOSIS — E876 Hypokalemia: Secondary | ICD-10-CM | POA: Diagnosis not present

## 2021-04-14 DIAGNOSIS — K5720 Diverticulitis of large intestine with perforation and abscess without bleeding: Secondary | ICD-10-CM | POA: Diagnosis not present

## 2021-04-14 LAB — CBC
HCT: 23.8 % — ABNORMAL LOW (ref 36.0–46.0)
Hemoglobin: 7.8 g/dL — ABNORMAL LOW (ref 12.0–15.0)
MCH: 29 pg (ref 26.0–34.0)
MCHC: 32.8 g/dL (ref 30.0–36.0)
MCV: 88.5 fL (ref 80.0–100.0)
Platelets: 449 10*3/uL — ABNORMAL HIGH (ref 150–400)
RBC: 2.69 MIL/uL — ABNORMAL LOW (ref 3.87–5.11)
RDW: 22 % — ABNORMAL HIGH (ref 11.5–15.5)
WBC: 17 10*3/uL — ABNORMAL HIGH (ref 4.0–10.5)
nRBC: 0 % (ref 0.0–0.2)

## 2021-04-14 LAB — HEMOGLOBIN A1C
Hgb A1c MFr Bld: 4.6 % — ABNORMAL LOW (ref 4.8–5.6)
Mean Plasma Glucose: 85.32 mg/dL

## 2021-04-14 LAB — SURGICAL PCR SCREEN
MRSA, PCR: NEGATIVE
Staphylococcus aureus: NEGATIVE

## 2021-04-14 LAB — ECHOCARDIOGRAM COMPLETE
AV Mean grad: 4 mmHg
AV Peak grad: 8.6 mmHg
Ao pk vel: 1.47 m/s
Height: 63 in
S' Lateral: 2.3 cm
Weight: 3200 oz

## 2021-04-14 MED ORDER — SODIUM CHLORIDE 0.9 % IV SOLN
100.0000 mg | INTRAVENOUS | Status: DC
  Administered 2021-04-16 – 2021-04-20 (×5): 100 mg via INTRAVENOUS
  Filled 2021-04-14 (×7): qty 100

## 2021-04-14 MED ORDER — ACETAMINOPHEN 500 MG PO TABS
1000.0000 mg | ORAL_TABLET | ORAL | Status: AC
  Administered 2021-04-15: 1000 mg via ORAL
  Filled 2021-04-14: qty 2

## 2021-04-14 MED ORDER — ENSURE PRE-SURGERY PO LIQD
592.0000 mL | Freq: Once | ORAL | Status: AC
  Administered 2021-04-14: 592 mL via ORAL
  Filled 2021-04-14: qty 592

## 2021-04-14 MED ORDER — LIP MEDEX EX OINT
1.0000 "application " | TOPICAL_OINTMENT | Freq: Two times a day (BID) | CUTANEOUS | Status: DC
  Administered 2021-04-14 – 2021-05-01 (×33): 1 via TOPICAL
  Filled 2021-04-14 (×7): qty 7

## 2021-04-14 MED ORDER — DIPHENHYDRAMINE HCL 50 MG/ML IJ SOLN: 12.5000 mg | Freq: Four times a day (QID) | INTRAMUSCULAR | Status: DC | PRN

## 2021-04-14 MED ORDER — BUPIVACAINE LIPOSOME 1.3 % IJ SUSP: 20.0000 mL | Freq: Once | INTRAMUSCULAR | Status: DC

## 2021-04-14 MED ORDER — MENTHOL 3 MG MT LOZG: 1.0000 | LOZENGE | OROMUCOSAL | Status: DC | PRN

## 2021-04-14 MED ORDER — PROCHLORPERAZINE EDISYLATE 10 MG/2ML IJ SOLN
5.0000 mg | INTRAMUSCULAR | Status: DC | PRN
Start: 2021-04-14 — End: 2021-05-01

## 2021-04-14 MED ORDER — SIMETHICONE 40 MG/0.6ML PO SUSP
80.0000 mg | Freq: Four times a day (QID) | ORAL | Status: DC | PRN
  Filled 2021-04-14: qty 1.2

## 2021-04-14 MED ORDER — CLINDAMYCIN PHOSPHATE 900 MG/50ML IV SOLN
900.0000 mg | INTRAVENOUS | Status: AC
  Administered 2021-04-15: 900 mg via INTRAVENOUS
  Filled 2021-04-14: qty 50

## 2021-04-14 MED ORDER — PHENOL 1.4 % MT LIQD
2.0000 | OROMUCOSAL | Status: DC | PRN
  Administered 2021-04-19: 2 via OROMUCOSAL
  Filled 2021-04-14: qty 177

## 2021-04-14 MED ORDER — MUPIROCIN 2 % EX OINT
1.0000 "application " | TOPICAL_OINTMENT | Freq: Two times a day (BID) | CUTANEOUS | Status: AC
  Administered 2021-04-14 – 2021-04-19 (×9): 1 via NASAL
  Filled 2021-04-14 (×2): qty 22

## 2021-04-14 MED ORDER — HYDROMORPHONE HCL 1 MG/ML IJ SOLN: 0.5000 mg | INTRAMUSCULAR | Status: DC | PRN

## 2021-04-14 MED ORDER — LACTATED RINGERS IV BOLUS
1000.0000 mL | Freq: Once | INTRAVENOUS | Status: AC
  Administered 2021-04-14: 1000 mL via INTRAVENOUS

## 2021-04-14 MED ORDER — MAGIC MOUTHWASH
15.0000 mL | Freq: Four times a day (QID) | ORAL | Status: DC | PRN
  Filled 2021-04-14: qty 15

## 2021-04-14 MED ORDER — METHOCARBAMOL 1000 MG/10ML IJ SOLN
1000.0000 mg | Freq: Four times a day (QID) | INTRAVENOUS | Status: DC | PRN
  Filled 2021-04-14: qty 10

## 2021-04-14 MED ORDER — LACTATED RINGERS IV BOLUS
1000.0000 mL | Freq: Three times a day (TID) | INTRAVENOUS | Status: AC | PRN
  Administered 2021-04-16: 1000 mL via INTRAVENOUS

## 2021-04-14 MED ORDER — ENOXAPARIN SODIUM 40 MG/0.4ML IJ SOSY
40.0000 mg | PREFILLED_SYRINGE | INTRAMUSCULAR | Status: AC
  Administered 2021-04-15: 40 mg via SUBCUTANEOUS
  Filled 2021-04-14: qty 0.4

## 2021-04-14 MED ORDER — GABAPENTIN 300 MG PO CAPS
300.0000 mg | ORAL_CAPSULE | ORAL | Status: AC
  Administered 2021-04-15: 300 mg via ORAL
  Filled 2021-04-14: qty 1

## 2021-04-14 MED ORDER — ALUM & MAG HYDROXIDE-SIMETH 200-200-20 MG/5ML PO SUSP: 30.0000 mL | Freq: Four times a day (QID) | ORAL | Status: DC | PRN

## 2021-04-14 MED ORDER — ALVIMOPAN 12 MG PO CAPS
12.0000 mg | ORAL_CAPSULE | ORAL | Status: AC
  Administered 2021-04-15: 12 mg via ORAL
  Filled 2021-04-14: qty 1

## 2021-04-14 MED ORDER — SODIUM CHLORIDE 0.9 % IV SOLN
200.0000 mg | Freq: Once | INTRAVENOUS | Status: AC
  Administered 2021-04-14: 200 mg via INTRAVENOUS
  Filled 2021-04-14: qty 200

## 2021-04-14 MED ORDER — CELECOXIB 200 MG PO CAPS
200.0000 mg | ORAL_CAPSULE | ORAL | Status: AC
  Administered 2021-04-15: 200 mg via ORAL
  Filled 2021-04-14: qty 1

## 2021-04-14 MED ORDER — GENTAMICIN SULFATE 40 MG/ML IJ SOLN
5.0000 mg/kg | INTRAVENOUS | Status: AC
  Administered 2021-04-15: 340 mg via INTRAVENOUS
  Filled 2021-04-14: qty 8.5

## 2021-04-14 MED ORDER — ENSURE PRE-SURGERY PO LIQD
296.0000 mL | Freq: Once | ORAL | Status: AC
  Administered 2021-04-15: 296 mL via ORAL
  Filled 2021-04-14: qty 296

## 2021-04-14 NOTE — Progress Notes (Signed)
Subjective: CC: Having more pain in her LLQ that is severe. She reports she has crampy pain after eating. Some nausea yesterday, none today. Denies emesis. BM this morning.   Objective: Vital signs in last 24 hours: Temp:  [98.2 F (36.8 C)-98.9 F (37.2 C)] 98.9 F (37.2 C) (11/08 1016) Pulse Rate:  [116-121] 121 (11/08 0429) Resp:  [16-18] 18 (11/08 1016) BP: (93-136)/(69-100) 109/82 (11/08 1016) SpO2:  [98 %-100 %] 98 % (11/08 1016) Last BM Date: 04/14/21  Intake/Output from previous day: 11/07 0701 - 11/08 0700 In: 800 [P.O.:480; I.V.:20; IV Piggyback:300] Out: -  Intake/Output this shift: Total I/O In: 360 [P.O.:360] Out: 650 [Urine:650]  PE: Gen:  Alert, NAD, pleasant Card: Tachycardic Pulm:  Normal rate and effort Abd: Soft, obese but appears ND. More tender in the left mid to lower abdomen compared to yesterday but without rigidity or guarding. +BS. LLQ drain with thin brown feculent/purulent output.   Lab Results:  Recent Labs    04/13/21 0546 04/14/21 0327  WBC 16.1* 17.0*  HGB 8.5* 7.8*  HCT 26.0* 23.8*  PLT 514* 449*   BMET Recent Labs    04/12/21 0525 04/13/21 0546  NA 136 136  K 3.8 3.5  CL 103 105  CO2 26 26  GLUCOSE 81 82  BUN 12 11  CREATININE 0.40* 0.45  CALCIUM 7.3* 7.2*   PT/INR No results for input(s): LABPROT, INR in the last 72 hours. CMP     Component Value Date/Time   NA 136 04/13/2021 0546   NA 139 12/21/2019 1024   K 3.5 04/13/2021 0546   CL 105 04/13/2021 0546   CO2 26 04/13/2021 0546   GLUCOSE 82 04/13/2021 0546   BUN 11 04/13/2021 0546   BUN 13 12/21/2019 1024   CREATININE 0.45 04/13/2021 0546   CALCIUM 7.2 (L) 04/13/2021 0546   PROT 6.2 (L) 04/11/2021 0302   PROT 7.4 07/11/2019 1638   ALBUMIN 1.7 (L) 04/11/2021 0302   ALBUMIN 4.6 07/11/2019 1638   AST 16 04/11/2021 0302   ALT 19 04/11/2021 0302   ALKPHOS 147 (H) 04/11/2021 0302   BILITOT 0.7 04/11/2021 0302   BILITOT 0.2 07/11/2019 1638   GFRNONAA  >60 04/13/2021 0546   GFRAA 96 12/21/2019 1024   Lipase     Component Value Date/Time   LIPASE 21 11/04/2020 1119    Studies/Results: IR Catheter Tube Change  Result Date: 04/14/2021 CLINICAL DATA:  Large intra-abscess. malfunctioning indwelling drain. EXAM: IR DRAINAGE CATHETER EVALUATION, EXCHANGE AND UPSIZE COMPARISON:  CT Abdomen Pelvis, 04/11/2019. IR fluoroscopy, 02/19/2021. CONTRAST:  10 mL Omnipaque 300-administered via the percutaneous drainage catheter. MEDICATIONS: None. ANESTHESIA/SEDATION: Local anesthetic was administered. FLUOROSCOPY TIME:  3 minutes 33 seconds.  10 mGy. TECHNIQUE: Patient was positioned supine on the fluoroscopy table. The external portion of the existing percutaneous drainage catheter as well as the surrounding skin was prepped and draped in usual sterile fashion. A preprocedural spot fluoroscopic image was obtained of the existing percutaneous drainage catheter. A small amount of contrast was injected via the existing percutaneous drainage catheter and several fluoroscopic images were obtained in various obliquities. The external portion of the percutaneous drainage catheter was cut and cannulated with a short Amplatz wire. Under intermittent fluoroscopic guidance, the existing percutaneous drainage catheter was exchanged for a new 74 French percutaneous drainage catheter with end coiled and locked within the decompressed abscess cavity. Contrast injection confirmed appropriate position functionality of the percutaneous drainage catheter. The percutaneous drainage  catheter was connected to a gravity drainage bag and secured in place within 0-silk interrupted suture and a StatLock device. A dressing was applied. The patient tolerated the procedure well without immediate postprocedural complication. FINDINGS: 1. Malfunctioning drain with large residual intra-abdominal abscess. 2. Multi compartment abscess, with patient unable to tolerate further investigation and multiple  drain placement. IMPRESSION: Successful fluoroscopic-guided LEFT lower quadrant drainage catheter exchange and upsize, as above. PLAN: Continue with previous drain care, including routine flushes for catheter patency. Interventional radiology will follow patient. Drain interrogation to be performed in 2 weeks, or if earlier suspected catheter malfunction. Michaelle Birks, MD [facility] Radiology Electronically Signed   By: Michaelle Birks M.D.   On: 04/14/2021 10:52   DG CHEST PORT 1 VIEW  Result Date: 04/12/2021 CLINICAL DATA:  PICC placement EXAM: PORTABLE CHEST 1 VIEW COMPARISON:  04/10/2021 FINDINGS: Right arm PICC tip in the proximal SVC. Lungs remain clear without infiltrate or effusion. IMPRESSION: PICC tip in the proximal SVC.  Lungs are clear Electronically Signed   By: Franchot Gallo M.D.   On: 04/12/2021 19:55   [place] EKG SITE RITE  Result Date: 04/12/2021 If Site Rite image not attached, placement could not be confirmed due to current cardiac rhythm.   Anti-infectives: Anti-infectives (From admission, onward)    Start     Dose/Rate Route Frequency Ordered Stop   04/15/21 1000  anidulafungin (ERAXIS) 100 mg in sodium chloride 0.9 % 100 mL IVPB        100 mg 78 mL/hr over 100 Minutes Intravenous Every 24 hours 04/14/21 0744     04/14/21 1000  anidulafungin (ERAXIS) 200 mg in sodium chloride 0.9 % 200 mL IVPB        200 mg 78 mL/hr over 200 Minutes Intravenous  Once 04/14/21 0755     04/13/21 0900  meropenem (MERREM) 1 g in sodium chloride 0.9 % 100 mL IVPB        1 g 200 mL/hr over 30 Minutes Intravenous Every 8 hours 04/13/21 0800     04/10/21 2200  cefTRIAXone (ROCEPHIN) 2 g in sodium chloride 0.9 % 100 mL IVPB  Status:  Discontinued        2 g 200 mL/hr over 30 Minutes Intravenous Daily at bedtime 04/10/21 2044 04/13/21 0746   04/10/21 2200  metroNIDAZOLE (FLAGYL) IVPB 500 mg  Status:  Discontinued        500 mg 100 mL/hr over 60  Minutes Intravenous Every 12 hours 04/10/21 2044 04/13/21 0745        Assessment/Plan Complicated diverticulitis with abscess - Originally admitted from 5/31-6/6 with perforated diverticulitis with an abscess.  Underwent CT guided drain placement x2 on 6/3 and was discharged on cefdinir and flagyl based on culture results.  Follow-up CT scan did show direct communication between drain and small bowel. She has had numerous drain exchanges. She had followed up with Dr. Marcello Moores in the office for this and was referred for colonoscopy to rule out concern for possible mass.  She has been seen by Dr. Lorenso Courier of GI but has not yet undergone colonoscopy -CT 11/4 showed descending colon diverticulitis with perforation and new fluid collection posterior to inflamed colon measuring 8.5 x 3.5 x 10 cm extending into the left lateral abdominal wall.  The tip of the drain catheter was in this collection.  IR upsized drain 11/7. Now feculant.  The anterior PERC drain was in the subcu tissues and was removed by IR on  11/5.  - Cont IV abx (Merrem and Eraxis)  - Despite broad-spectrum antibiotics and IR drain upsizing - the patient has increasing left lower quadrant abdominal pain, persistent tachycardia and rising WBC. Drain appears feculent. Will back down her diet to CLD.  We discussed surgery including colectomy and colostomy.  Patient reports that she has discussed with her family is now open to proceeding with surgery during admission.  During discussion of the surgery, the patient became anxious and asked I come back later to finish discussion. My attending states he will plan to have a more detailed discussion with her today.  Will make n.p.o. at midnight. I have asked TRH to ensure patient is medically optimized and has medical clearance in case she requires surgery during admission.    FEN - CLD, NPO at midnight VTE - SCDs, Lovenox ID - Merrem, Eraxis   Pyuria - UCx w/ E. Coli Tachycardia - TRH getting  echo Syncope - TRH working up   LOS: 4 days    Jillyn Ledger , [facility] Surgery 04/14/2021, 11:12 AM Please see Amion for pager number during day hours 7:00am-4:30pm

## 2021-04-14 NOTE — Plan of Care (Signed)
Eshika had a great deal of pain around her drain site this morning during her bath and linen change.  Morpnine given with moderate effect. Her drain is emptying brown, thick drainage. Drain flushed with 27ml NS without difficulty. Mikayla Rios is scheduled for colectomy tomorrow and is aware she will be NPO after MN. Consent signed and in chart. She is tolerating a clear liquid diet without nausea/pain.  Problem: Education: Goal: Knowledge of General Education information will improve Description: Including pain rating scale, medication(s)/side effects and non-pharmacologic comfort measures Outcome: Progressing   Problem: Health Behavior/Discharge Planning: Goal: Ability to manage health-related needs will improve Outcome: Progressing   Problem: Clinical Measurements: Goal: Ability to maintain clinical measurements within normal limits will improve Outcome: Progressing Goal: Will remain free from infection Outcome: Progressing Note: IV abx Goal: Diagnostic test results will improve Outcome: Progressing Goal: Respiratory complications will improve Outcome: Progressing Goal: Cardiovascular complication will be avoided Outcome: Progressing   Problem: Activity: Goal: Risk for activity intolerance will decrease Outcome: Progressing   Problem: Nutrition: Goal: Adequate nutrition will be maintained Outcome: Progressing   Problem: Coping: Goal: Level of anxiety will decrease Outcome: Progressing   Problem: Elimination: Goal: Will not experience complications related to bowel motility Outcome: Progressing Goal: Will not experience complications related to urinary retention Outcome: Progressing   Problem: Pain Managment: Goal: General experience of comfort will improve Outcome: Progressing   Problem: Safety: Goal: Ability to remain free from injury will improve Outcome: Progressing   Problem: Skin Integrity: Goal: Risk for impaired skin integrity will decrease Outcome: Progressing

## 2021-04-14 NOTE — Progress Notes (Signed)
Referring Physician(s): * No referring provider recorded for this case *  Supervising Physician: Ruthann Cancer  Patient Status:  [facility] - In-pt  Chief Complaint:  Complicated diverticulitis with abscess drain upsized on 01/13/2021 from 10 F to 14 F.  Subjective:  Patient complains of tenderness to left lower quadrant.  She states she is more sensitive to touch than before.  She adds that she will be unable to have drains or upsize is done without sedation in the future due to pain.  Patient states she is able to eat and drink without nausea/vomiting.  She adds that she just received IV morphine for her abdominal pain.  Allergies: Shellfish allergy and Penicillins  Medications: Prior to Admission medications   Medication Sig Start Date End Date Taking? Authorizing Provider  acetaminophen (TYLENOL) 500 MG tablet Take 1,000 mg by mouth every 6 (six) hours as needed for moderate pain or headache.   Yes [provider]  amLODipine (NORVASC) 10 MG tablet TAKE 1 TABLET BY MOUTH EVERY DAY Patient taking differently: Take 10 mg by mouth every evening. 01/19/21  Yes [name], MD  dicyclomine (BENTYL) 10 MG capsule Take 1 capsule (10 mg total) by mouth 3 (three) times daily as needed for spasms. 03/12/21  Yes Sharyn Creamer, MD  lisinopril-hydrochlorothiazide (ZESTORETIC) 20-12.5 MG tablet TAKE 2 TABLETS BY MOUTH EVERY DAY Patient taking differently: Take 2 tablets by mouth daily. 12/10/20  Yes [name], MD  metoprolol succinate (TOPROL-XL) 25 MG 24 hr tablet TAKE 1 TABLET BY MOUTH TWICE A DAY Patient taking differently: Take 25 mg by mouth 2 (two) times daily. 04/10/21  Yes Ghumman, Milford Cage, NP  Prenatal Vit-Fe Fumarate-FA (PRENATAL MULTIVITAMIN) TABS tablet Take 1 tablet by mouth daily at 12 noon.   Yes [provider]  Simethicone (GAS-X PO) Take 1 tablet by mouth daily as needed (gas).   Yes [provider]  traMADol (ULTRAM) 50 MG tablet  Take 50 mg by mouth every 6 (six) hours as needed for moderate pain. 12/29/20  Yes [provider]  benzonatate (TESSALON PERLES) 100 MG capsule Take 1 capsule (100 mg total) by mouth every 6 (six) hours as needed. Patient not taking: Reported on 04/10/2021 10/06/20 10/06/21  Minette Brine, FNP  Sodium Chloride Flush (NORMAL SALINE FLUSH) 0.9 % SOLN Flush as directed Patient not taking: Reported on 04/10/2021 11/11/20   Sandi Mariscal, MD  sodium chloride flush (NS) 0.9 % SOLN 5 mLs by Intracatheter route every 8 (eight) hours. Patient not taking: Reported on 04/10/2021 11/11/20   Norm Parcel, PA-C     Vital Signs: BP (!) 119/92 (BP Location: Left Arm)   Pulse (!) 121   Temp 98.8 F (37.1 C) (Oral)   Resp 18   Ht 5\' 3"  (1.6 m)   Wt 200 lb (90.7 kg)   LMP 04/10/2021 (Approximate)   SpO2 100%   BMI 35.43 kg/m   Physical Exam Constitutional:      Appearance: Normal appearance. She is not ill-appearing.  HENT:     Head: Normocephalic and atraumatic.     Mouth/Throat:     Mouth: Mucous membranes are moist.     Pharynx: Oropharynx is clear.  Eyes:     Pupils: Pupils are equal, round, and reactive to light.  Cardiovascular:     Rate and Rhythm: Tachycardia present.  Pulmonary:     Effort: Pulmonary effort is normal.  Abdominal:     Palpations: Abdomen is soft.  Tenderness: There is abdominal tenderness. There is guarding.     Comments: Patient endorses tenderness to left lower quadrant. Left lower quadrant drain in place.  Small amount of clear drainage around insertion site and on dressing.  Sutures and StatLock in place.  Skin:    General: Skin is warm and dry.  Neurological:     Mental Status: She is alert and oriented to person, place, and time.  Psychiatric:        Mood and Affect: Mood normal.        Behavior: Behavior normal.        Thought Content: Thought content normal.        Judgment: Judgment normal.    Imaging: CT Angio Chest PE W and/or Wo  Contrast  Result Date: 04/10/2021 CLINICAL DATA:  High probability PE. Being currently treated for abscess diverticulitis. Tachycardia. EXAM: CT ANGIOGRAPHY CHEST CT ABDOMEN AND PELVIS WITH CONTRAST TECHNIQUE: Multidetector CT imaging of the chest was performed using the standard protocol during bolus administration of intravenous contrast. Multiplanar CT image reconstructions and MIPs were obtained to evaluate the vascular anatomy. Multidetector CT imaging of the abdomen and pelvis was performed using the standard protocol during bolus administration of intravenous contrast. CONTRAST:  59mL OMNIPAQUE IOHEXOL 350 MG/ML SOLN COMPARISON:  CT abdomen and pelvis 04/01/2021. CT abdomen and pelvis 11/27/2020. FINDINGS: CTA CHEST FINDINGS Cardiovascular: Satisfactory opacification of the pulmonary arteries to the segmental level. No evidence of pulmonary embolism. Normal heart size. No pericardial effusion. Mediastinum/Nodes: No enlarged mediastinal, hilar, or axillary lymph nodes. Thyroid gland, trachea, and esophagus demonstrate no significant findings. Lungs/Pleura: Lungs are clear. No pleural effusion or pneumothorax. Musculoskeletal: No chest wall abnormality. No acute or significant osseous findings. Review of the MIP images confirms the above findings. CT ABDOMEN and PELVIS FINDINGS Hepatobiliary: There is diffuse fatty infiltration of the liver. Gallstones are present. There is no biliary ductal dilatation. Pancreas: Unremarkable. No pancreatic ductal dilatation or surrounding inflammatory changes. Spleen: Normal in size without focal abnormality. Adrenals/Urinary Tract: There is a 3.5 x 2.8 cm stable left adrenal nodule previously characterized as adenoma. Right adrenal gland is within normal limits. There is a cyst in the inferior pole the left kidney measuring 3 cm, unchanged. The kidneys otherwise appear within normal limits. The bladder is within normal limits. Stomach/Bowel: There is no evidence for bowel  obstruction. There is marked focal wall thickening of the mid descending colon with surrounding inflammatory stranding. This has slightly increased when compared to the prior study. There is a small amount of extraluminal gas compatible with perforation. Gas has increased when compared to the prior examination. Stomach is within normal limits. Vascular/Lymphatic: No significant vascular findings are present. Aorta and IVC are normal in size. There are prominent and enlarged central mesenteric lymph nodes which are new from the prior examination. The largest lymph node measures 1.5 by 2.4 cm image 4/49. Reproductive: Again seen is a hypodense lesion in the fundus of the uterus containing some calcifications measuring 4.7 cm similar to the prior study. Adnexa are within normal limits. Other: Previously identified percutaneous drainage catheter in the anterior left abdomen has been pulled back in the interval. The distal catheter tip is in the deep subcutaneous soft tissues of the anterior abdominal wall. Previously identified left lateral percutaneous drainage catheter is unchanged in position. The distal portion of the catheter is in a new lobulated enhancing fluid collection containing air abutting the inflamed portion of the colon. This collection measures 8.5 x 3.5 x  10.0 cm and is now within the adjacent iliopsoas musculature and abutting the left iliacus musculature. This fluid collection also extends through the left lateral abdominal wall on image 4/51, a new finding. There is trace free fluid in the pelvis. There is no focal abdominal wall hernia. Musculoskeletal: No acute or significant osseous findings. Review of the MIP images confirms the above findings. IMPRESSION: 1. Again seen are findings compatible with descending colon diverticulitis with perforation. Free air and inflammation have increased in the interval. 2. New lobulated enhancing fluid collection posterior to this segment of inflamed colon  measuring 8.5 x 3.5 x 10.0 cm. This collection now invades the adjacent iliopsoas muscle as well as extends through the left lateral abdominal wall. The tip of the drainage catheter is in this collection. Findings are compatible with abscess. 3. Anterior left percutaneous drainage catheter tip has been pulled back and is now within the subcutaneous tissues. 4. Trace free fluid. 5. No pulmonary embolism.  No acute cardiopulmonary process. 6. Cholelithiasis. 7. Fatty infiltration of the liver. Electronically Signed   By: Ronney Asters M.D.   On: 04/10/2021 19:01   CT ABDOMEN PELVIS W CONTRAST  Result Date: 04/10/2021 CLINICAL DATA:  High probability PE. Being currently treated for abscess diverticulitis. Tachycardia. EXAM: CT ANGIOGRAPHY CHEST CT ABDOMEN AND PELVIS WITH CONTRAST TECHNIQUE: Multidetector CT imaging of the chest was performed using the standard protocol during bolus administration of intravenous contrast. Multiplanar CT image reconstructions and MIPs were obtained to evaluate the vascular anatomy. Multidetector CT imaging of the abdomen and pelvis was performed using the standard protocol during bolus administration of intravenous contrast. CONTRAST:  14mL OMNIPAQUE IOHEXOL 350 MG/ML SOLN COMPARISON:  CT abdomen and pelvis 04/01/2021. CT abdomen and pelvis 11/27/2020. FINDINGS: CTA CHEST FINDINGS Cardiovascular: Satisfactory opacification of the pulmonary arteries to the segmental level. No evidence of pulmonary embolism. Normal heart size. No pericardial effusion. Mediastinum/Nodes: No enlarged mediastinal, hilar, or axillary lymph nodes. Thyroid gland, trachea, and esophagus demonstrate no significant findings. Lungs/Pleura: Lungs are clear. No pleural effusion or pneumothorax. Musculoskeletal: No chest wall abnormality. No acute or significant osseous findings. Review of the MIP images confirms the above findings. CT ABDOMEN and PELVIS FINDINGS Hepatobiliary: There is diffuse fatty infiltration  of the liver. Gallstones are present. There is no biliary ductal dilatation. Pancreas: Unremarkable. No pancreatic ductal dilatation or surrounding inflammatory changes. Spleen: Normal in size without focal abnormality. Adrenals/Urinary Tract: There is a 3.5 x 2.8 cm stable left adrenal nodule previously characterized as adenoma. Right adrenal gland is within normal limits. There is a cyst in the inferior pole the left kidney measuring 3 cm, unchanged. The kidneys otherwise appear within normal limits. The bladder is within normal limits. Stomach/Bowel: There is no evidence for bowel obstruction. There is marked focal wall thickening of the mid descending colon with surrounding inflammatory stranding. This has slightly increased when compared to the prior study. There is a small amount of extraluminal gas compatible with perforation. Gas has increased when compared to the prior examination. Stomach is within normal limits. Vascular/Lymphatic: No significant vascular findings are present. Aorta and IVC are normal in size. There are prominent and enlarged central mesenteric lymph nodes which are new from the prior examination. The largest lymph node measures 1.5 by 2.4 cm image 4/49. Reproductive: Again seen is a hypodense lesion in the fundus of the uterus containing some calcifications measuring 4.7 cm similar to the prior study. Adnexa are within normal limits. Other: Previously identified percutaneous drainage catheter in  the anterior left abdomen has been pulled back in the interval. The distal catheter tip is in the deep subcutaneous soft tissues of the anterior abdominal wall. Previously identified left lateral percutaneous drainage catheter is unchanged in position. The distal portion of the catheter is in a new lobulated enhancing fluid collection containing air abutting the inflamed portion of the colon. This collection measures 8.5 x 3.5 x 10.0 cm and is now within the adjacent iliopsoas musculature and  abutting the left iliacus musculature. This fluid collection also extends through the left lateral abdominal wall on image 4/51, a new finding. There is trace free fluid in the pelvis. There is no focal abdominal wall hernia. Musculoskeletal: No acute or significant osseous findings. Review of the MIP images confirms the above findings. IMPRESSION: 1. Again seen are findings compatible with descending colon diverticulitis with perforation. Free air and inflammation have increased in the interval. 2. New lobulated enhancing fluid collection posterior to this segment of inflamed colon measuring 8.5 x 3.5 x 10.0 cm. This collection now invades the adjacent iliopsoas muscle as well as extends through the left lateral abdominal wall. The tip of the drainage catheter is in this collection. Findings are compatible with abscess. 3. Anterior left percutaneous drainage catheter tip has been pulled back and is now within the subcutaneous tissues. 4. Trace free fluid. 5. No pulmonary embolism.  No acute cardiopulmonary process. 6. Cholelithiasis. 7. Fatty infiltration of the liver. Electronically Signed   By: Ronney Asters M.D.   On: 04/10/2021 19:01   DG CHEST PORT 1 VIEW  Result Date: 04/12/2021 CLINICAL DATA:  PICC placement EXAM: PORTABLE CHEST 1 VIEW COMPARISON:  04/10/2021 FINDINGS: Right arm PICC tip in the proximal SVC. Lungs remain clear without infiltrate or effusion. IMPRESSION: PICC tip in the proximal SVC.  Lungs are clear Electronically Signed   By: Franchot Gallo M.D.   On: 04/12/2021 19:55   DG Chest Portable 1 View  Result Date: 04/10/2021 CLINICAL DATA:  Weakness. EXAM: PORTABLE CHEST 1 VIEW COMPARISON:  March 31, 2016 FINDINGS: The heart size and mediastinal contours are within normal limits. Both lungs are clear. The visualized skeletal structures are unremarkable. IMPRESSION: No active disease. Electronically Signed   By: Virgina Norfolk M.D.   On: 04/10/2021 15:51   [place] EKG SITE RITE  Result  Date: 04/12/2021 If Site Rite image not attached, placement could not be confirmed due to current cardiac rhythm.   Labs:  CBC: Recent Labs    04/11/21 0302 04/12/21 0525 04/13/21 0546 04/14/21 0327  WBC 15.4* 8.8 16.1* 17.0*  HGB 8.4* 7.1* 8.5* 7.8*  HCT 26.8* 22.7* 26.0* 23.8*  PLT 696* 433* 514* 449*    COAGS: Recent Labs    11/07/20 1820 04/11/21 0302  INR 1.3* 1.2  APTT  --  28    BMP: Recent Labs    04/10/21 1446 04/11/21 0302 04/12/21 0525 04/13/21 0546  NA 135 135 136 136  K 2.7* 4.3 3.8 3.5  CL 97* 100 103 105  CO2 27 25 26 26   GLUCOSE 84 82 81 82  BUN 17 16 12 11   CALCIUM 7.5* 7.6* 7.3* 7.2*  CREATININE 0.46 0.49 0.40* 0.45  GFRNONAA >60 >60 >60 >60    LIVER FUNCTION TESTS: Recent Labs    11/04/20 1119 04/10/21 1446 04/11/21 0302  BILITOT 0.4 0.6 0.7  AST 15 16 16   ALT 12 19 19   ALKPHOS 66 143* 147*  PROT 7.9 6.2* 6.2*  ALBUMIN 2.5* 1.6* 1.7*  Assessment and Plan: Patient sitting upright in bed eating breakfast. Left lower quadrant drain insertion site has small amount of clear drainage around insertion site and on dressing. There is no bleeding, or purulent drainage noted.  Sutures and StatLock in place.  There is approximately 25 cc tan, purulent drainage in JP with 60 cc documented in epic over the past 24 hours.  Drain flushes/aspirates easily.  RN made aware drain needs to be flushed 3 times daily to keep drain open and output documented.  WBC 17 today from 16.1 yesterday. She is afebrile but tachycardic at 121. IR to continue to follow.   Electronically Signed: Tyson Alias, NP 04/14/2021, 9:39 AM   I spent a total of 15 Minutes at the the patient's bedside AND on the patient's hospital floor or unit, greater than 50% of which was counseling/coordinating care for complicated diverticulitis with abscess drain upsize 01/13/2021 from 10 F to 14 F.

## 2021-04-14 NOTE — Progress Notes (Addendum)
PROGRESS NOTE    Mikayla Rios  MCN:[id number] DOB: 1985/09/01 DOA: 04/10/2021 PCP: [name], MD    Chief Complaint  Patient presents with   Weakness   Tachycardia    Brief Narrative:   35 year old female with past medical history of hypertension, inappropriate sinus tachycardia, obesity, obstructive sleep apnea and diverticulosis with complicated diverticulitis and abscess formation 11/2020 status post IR guided placement of 2 left lower quadrant drains who presents to [facility] emergency department with complaints of generalized weakness and lightheadedness. She was found to have new abscess measuring about 8.5 times 10 cm extending into the left lateral abd wall. She underwent drain exchange/upsize on Monday by IR. She is scheduled for OR tomorrow for colectomy and colostomy by Dr Johney Maine.   Assessment & Plan:   Principal Problem:   Diverticulitis of large intestine with abscess Active Problems:   Class 3 severe obesity due to excess calories with serious comorbidity and body mass index (BMI) of 60.0 to 69.9 in adult [facility])   Obstructive sleep apnea   Anemia   Inappropriate sinus tachycardia   Essential hypertension   SIRS (systemic inflammatory response syndrome) (HCC)   Hypokalemia, inadequate intake   Protein-calorie malnutrition, severe (HCC)   Reactive thrombocytosis   Prolonged QT interval   Complicated diverticulitis with abscess s/p drain placement by IR twice in June 2022 with numerous exchanges since then due to persistent fistulous -CT of the abdomen and pelvis New lobulated enhancing fluid collection posterior to this segment of inflamed colon measuring 8.5 x 3.5 x 10.0 cm. This collection now invades the adjacent iliopsoas muscle as well as extends through the left lateral abdominal wall. -CT imaging today shows the anterior drain has been pulled back into subcutaneous tissue and hence it was removed by IR at bedside. The left lateral drain is within  a large collection measuring about 8.5 into 10 cm. Patient reports no output.  It was flushed by IR and aspirated with return of purulent and foul-smelling material and the drain was connected to JP bulb. She underwent drain exchange/upsize on Monday by IR Meanwhile patient is on IV Rocephin and Flagyl for intra-abdominal infection. General surgery on board and suggest that she will need surgery , plan for lap hartmann resection with colectomy and colostomy tomorrow as she is not improving with non surgical management.  Remains afebrile but with persistent leukocytosis. Cultures from the drain show E coli, sensitive to Meropenem.  GI consulted for evaluation of colonoscopy for evaluation of malignancy,.    Pyuria Urine cultures ordered, growing e coli,  and patient already started on antibiotics.    Mechanical Fall:  Patient reports dizziness and an episode of syncope and an episode of mechanical fall possibly from orthostatic hypotension.  Monitor on telemetry. CT angiogram of the chest ruled out PE EKG shows prolonged QT interval 636.  Nonspecific T wave inversions.troponin is negative. Patient denies any chest pain or shortness of breath. Probably secondary to orthostatic hypotension from infection. Get orthostatic vital signs tomorrow and therapy evaluations will be ordered.   Hypokalemia Replaced,.   Prolonged QTc: - from hypokalemia and hypomagnesemia. - K is 4.3. magnesium is 1.8. Keep k >4 and mag>2.   Repeat EKG shows improvement in the qtC.    Anemia of chronic disease/ Acute anemia of acute illness - normocytic.  - anemia panel  Shows low folate levels.  Drop in hemoglobin from 8.4 to 7.1.  1 unit of prbc transfusion ordered.  Hemoglobin improved to  8.5.  Folate supplementation added.  Transfuse to keep hemoglobin greater than 7.    Thrombocytosis:  Possibly from the infection.    Hypoalbuminemia:  - delaying the infection.  - will get dietary consult.     Inappropriate tachycardia:  Probably from anemia and infection and pain.  Pt not symptomatic.  Echocardiogram ordered and pending.  Increase metoprolol to 50 mg BID.    Hypertension:  Well controlled.   Leukocytosis:  Probably in the infection.    DVT prophylaxis: scd's Code Status: (Full code) Family Communication: family at bedside.  Disposition:   Status is: Inpatient  Remains inpatient appropriate because: IV ANTIBIOTICS       Consultants:  Surgery IR  Procedures: none.   Antimicrobials:  Antibiotics Given (last 72 hours)     Date/Time Action Medication Dose Rate   04/11/21 2101 New Bag/Given   cefTRIAXone (ROCEPHIN) 2 g in sodium chloride 0.9 % 100 mL IVPB 2 g 200 mL/hr   04/11/21 2213 New Bag/Given   metroNIDAZOLE (FLAGYL) IVPB 500 mg 500 mg 100 mL/hr   04/12/21 0852 New Bag/Given   metroNIDAZOLE (FLAGYL) IVPB 500 mg 500 mg 100 mL/hr   04/12/21 2326 New Bag/Given   cefTRIAXone (ROCEPHIN) 2 g in sodium chloride 0.9 % 100 mL IVPB 2 g 200 mL/hr   04/13/21 0012 New Bag/Given   metroNIDAZOLE (FLAGYL) IVPB 500 mg 500 mg 100 mL/hr   04/13/21 1121 New Bag/Given   meropenem (MERREM) 1 g in sodium chloride 0.9 % 100 mL IVPB 1 g 200 mL/hr   04/13/21 1723 New Bag/Given   meropenem (MERREM) 1 g in sodium chloride 0.9 % 100 mL IVPB 1 g 200 mL/hr   04/14/21 0101 New Bag/Given   meropenem (MERREM) 1 g in sodium chloride 0.9 % 100 mL IVPB 1 g 200 mL/hr   04/14/21 1013 New Bag/Given   meropenem (MERREM) 1 g in sodium chloride 0.9 % 100 mL IVPB 1 g 200 mL/hr         Subjective: Reports worse pain yesterday with drain exchange.  No nausea, vomiting or abd pain.  Not symptomatic with tachycardia.  Some lightheadedness on getting out of bed.   Objective: Vitals:   04/14/21 0829 04/14/21 1016 04/14/21 1203 04/14/21 1417  BP: (!) 119/92 109/82 96/61 115/86  Pulse:   (!) 128 (!) 120  Resp:  18  18  Temp:  98.9 F (37.2 C) (!) 97.5 F (36.4 C) 99 F (37.2  C)  TempSrc:  Oral Oral Oral  SpO2:  98% 100% 100%  Weight:      Height:        Intake/Output Summary (Last 24 hours) at 04/14/2021 1423 Last data filed at 04/14/2021 1044 Gross per 24 hour  Intake 1260 ml  Output 650 ml  Net 610 ml    Filed Weights   04/10/21 1436  Weight: 90.7 kg    Examination:  General exam: Appears calm and comfortable  Respiratory system: Clear to auscultation. Respiratory effort normal. Cardiovascular system: S1 & S2 heard, RRR. No JVD, murmurs, rubs, gallops or clicks. No pedal edema. Gastrointestinal system: Abdomen is soft, mildy tender with IR drain in the left sided of the abd , draining pus . Central nervous system: Alert and oriented. No focal neurological deficits. Extremities: Symmetric 5 x 5 power. Skin: No rashes, lesions or ulcers Psychiatry: Mood & affect appropriate.        Data Reviewed: I have personally reviewed following labs and imaging studies  CBC:  Recent Labs  Lab 04/10/21 1446 04/11/21 0302 04/12/21 0525 04/13/21 0546 04/14/21 0327  WBC 14.1* 15.4* 8.8 16.1* 17.0*  NEUTROABS 12.2*  --   --   --   --   HGB 8.4* 8.4* 7.1* 8.5* 7.8*  HCT 27.3* 26.8* 22.7* 26.0* 23.8*  MCV 89.2 87.9 89.7 87.8 88.5  PLT 777* 696* 433* 514* 449*     Basic Metabolic Panel: Recent Labs  Lab 04/10/21 1446 04/10/21 1707 04/11/21 0302 04/12/21 0525 04/13/21 0546  NA 135  --  135 136 136  K 2.7*  --  4.3 3.8 3.5  CL 97*  --  100 103 105  CO2 27  --  25 26 26   GLUCOSE 84  --  82 81 82  BUN 17  --  16 12 11   CREATININE 0.46  --  0.49 0.40* 0.45  CALCIUM 7.5*  --  7.6* 7.3* 7.2*  MG  --  1.9 1.8  --   --      GFR: Estimated Creatinine Clearance: 102.9 mL/min (by C-G formula based on SCr of 0.45 mg/dL).  Liver Function Tests: Recent Labs  Lab 04/10/21 1446 04/11/21 0302  AST 16 16  ALT 19 19  ALKPHOS 143* 147*  BILITOT 0.6 0.7  PROT 6.2* 6.2*  ALBUMIN 1.6* 1.7*     CBG: No results for input(s): GLUCAP in the  last 168 hours.   Recent Results (from the past 240 hour(s))  Resp Panel by RT-PCR (Flu A&B, Covid) Nasopharyngeal Swab     Status: None   Collection Time: 04/10/21  2:46 PM   Specimen: Nasopharyngeal Swab; Nasopharyngeal(NP) swabs in vial transport medium  Result Value Ref Range Status   SARS Coronavirus 2 by RT PCR NEGATIVE NEGATIVE Final    Comment: (NOTE) SARS-CoV-2 target nucleic acids are NOT DETECTED.  The SARS-CoV-2 RNA is generally detectable in upper respiratory specimens during the acute phase of infection. The lowest concentration of SARS-CoV-2 viral copies this assay can detect is 138 copies/mL. A negative result does not preclude SARS-Cov-2 infection and should not be used as the sole basis for treatment or other patient management decisions. A negative result may occur with  improper specimen collection/handling, submission of specimen other than nasopharyngeal swab, presence of viral mutation(s) within the areas targeted by this assay, and inadequate number of viral copies(<138 copies/mL). A negative result must be combined with clinical observations, patient history, and epidemiological information. The expected result is Negative.  Fact Sheet for Patients:  EntrepreneurPulse.com.au  Fact Sheet for Healthcare Providers:  IncredibleEmployment.be  This test is no t yet approved or cleared by the [place] FDA and  has been authorized for detection and/or diagnosis of SARS-CoV-2 by FDA under an Emergency Use Authorization (EUA). This EUA will remain  in effect (meaning this test can be used) for the duration of the COVID-19 declaration under Section 564(b)(1) of the Act, 21 U.S.C.section 360bbb-3(b)(1), unless the authorization is terminated  or revoked sooner.       Influenza A by PCR NEGATIVE NEGATIVE Final   Influenza B by PCR NEGATIVE NEGATIVE Final    Comment: (NOTE) The Xpert Xpress SARS-CoV-2/FLU/RSV plus assay is  intended as an aid in the diagnosis of influenza from Nasopharyngeal swab specimens and should not be used as a sole basis for treatment. Nasal washings and aspirates are unacceptable for Xpert Xpress SARS-CoV-2/FLU/RSV testing.  Fact Sheet for Patients: EntrepreneurPulse.com.au  Fact Sheet for Healthcare Providers: IncredibleEmployment.be  This test is not yet approved  or cleared by the [place] and has been authorized for detection and/or diagnosis of SARS-CoV-2 by FDA under an Emergency Use Authorization (EUA). This EUA will remain in effect (meaning this test can be used) for the duration of the COVID-19 declaration under Section 564(b)(1) of the Act, 21 U.S.C. section 360bbb-3(b)(1), unless the authorization is terminated or revoked.  Performed at [facility], [place] [address], [place], [place] [address]   Blood culture (routine x 2)     Status: None (Preliminary result)   Collection Time: 04/10/21  4:42 PM   Specimen: BLOOD  Result Value Ref Range Status   Specimen Description   Final    BLOOD RIGHT ANTECUBITAL Performed at [place] [address], [place], [place] [address]    Special Requests   Final    BOTTLES DRAWN AEROBIC AND ANAEROBIC Blood Culture adequate volume Performed at [place] [address], [place], [place] [address]    Culture   Final    NO GROWTH 4 DAYS Performed at [facility], [place] [address], [place], [place] [address]    Report Status PENDING  Incomplete  Aerobic/Anaerobic Culture w Gram Stain (surgical/deep wound)     Status: None   Collection Time: 04/10/21  7:56 PM   Specimen: Abscess  Result Value Ref Range Status   Specimen Description   Final    ABSCESS Performed at [place] [address] [place], [place] [address]    Special Requests   Final    NONE Performed at [facility], [place] [address] [place], [place] [address]    Gram Stain   Final    NO SQUAMOUS EPITHELIAL CELLS SEEN FEW WBC SEEN FEW GRAM POSITIVE COCCI    Culture   Final    FEW ESCHERICHIA COLI FEW ACTINOMYCES ODONTOLYTICUS Standardized susceptibility testing for this organism is not available. MODERATE BACTEROIDES OVATUS BETA LACTAMASE POSITIVE Performed at [facility], [place] [address], [place], [place] [address]    Report Status 04/13/2021 FINAL  Final   Organism ID, Bacteria ESCHERICHIA COLI  Final      Susceptibility   Escherichia coli - MIC*    AMPICILLIN >=32 RESISTANT Resistant     CEFAZOLIN 8 SENSITIVE Sensitive     CEFEPIME <=0.12 SENSITIVE Sensitive     CEFTAZIDIME <=1 SENSITIVE Sensitive     CEFTRIAXONE <=0.25 SENSITIVE Sensitive     CIPROFLOXACIN >=4 RESISTANT Resistant     GENTAMICIN <=1 SENSITIVE Sensitive     IMIPENEM <=0.25 SENSITIVE Sensitive     TRIMETH/SULFA <=20 SENSITIVE Sensitive     AMPICILLIN/SULBACTAM >=32 RESISTANT Resistant     PIP/TAZO <=4 SENSITIVE Sensitive     * FEW ESCHERICHIA COLI  Urine Culture     Status: Abnormal   Collection Time: 04/11/21  1:09 PM   Specimen: Urine, Clean Catch  Result Value Ref Range Status   Specimen Description   Final    URINE, CLEAN CATCH Performed at [facility], [place] [address], [place], [place] [address]    Special Requests   Final    NONE Performed at [facility], [place] [address], [place], [place] [address]    Culture 70,000 COLONIES/mL ESCHERICHIA COLI (A)  Final   Report Status 04/13/2021 FINAL  Final   Organism ID, Bacteria ESCHERICHIA COLI (A)  Final      Susceptibility   Escherichia coli - MIC*  AMPICILLIN >=32 RESISTANT Resistant     CEFAZOLIN 8 SENSITIVE Sensitive     CEFEPIME <=0.12 SENSITIVE Sensitive     CEFTRIAXONE <=0.25 SENSITIVE Sensitive     CIPROFLOXACIN >=4 RESISTANT Resistant     GENTAMICIN <=1 SENSITIVE Sensitive     IMIPENEM <=0.25  SENSITIVE Sensitive     NITROFURANTOIN <=16 SENSITIVE Sensitive     TRIMETH/SULFA <=20 SENSITIVE Sensitive     AMPICILLIN/SULBACTAM >=32 RESISTANT Resistant     PIP/TAZO <=4 SENSITIVE Sensitive     * 70,000 COLONIES/mL ESCHERICHIA COLI          Radiology Studies: IR Catheter Tube Change  Result Date: 04/14/2021 CLINICAL DATA:  Large intra-abscess. malfunctioning indwelling drain. EXAM: IR DRAINAGE CATHETER EVALUATION, EXCHANGE AND UPSIZE COMPARISON:  CT Abdomen Pelvis, 04/11/2019. IR fluoroscopy, 02/19/2021. CONTRAST:  10 mL Omnipaque 300-administered via the percutaneous drainage catheter. MEDICATIONS: None. ANESTHESIA/SEDATION: Local anesthetic was administered. FLUOROSCOPY TIME:  3 minutes 33 seconds.  10 mGy. TECHNIQUE: Patient was positioned supine on the fluoroscopy table. The external portion of the existing percutaneous drainage catheter as well as the surrounding skin was prepped and draped in usual sterile fashion. A preprocedural spot fluoroscopic image was obtained of the existing percutaneous drainage catheter. A small amount of contrast was injected via the existing percutaneous drainage catheter and several fluoroscopic images were obtained in various obliquities. The external portion of the percutaneous drainage catheter was cut and cannulated with a short Amplatz wire. Under intermittent fluoroscopic guidance, the existing percutaneous drainage catheter was exchanged for a new 28 French percutaneous drainage catheter with end coiled and locked within the decompressed abscess cavity. Contrast injection confirmed appropriate position functionality of the percutaneous drainage catheter. The percutaneous drainage catheter was connected to a gravity drainage bag and secured in place within 0-silk interrupted suture and a StatLock device. A dressing was applied. The patient tolerated the procedure well without immediate postprocedural complication. FINDINGS: 1. Malfunctioning drain with  large residual intra-abdominal abscess. 2. Multi compartment abscess, with patient unable to tolerate further investigation and multiple drain placement. IMPRESSION: Successful fluoroscopic-guided LEFT lower quadrant drainage catheter exchange and upsize, as above. PLAN: Continue with previous drain care, including routine flushes for catheter patency. Interventional radiology will follow patient. Drain interrogation to be performed in 2 weeks, or if earlier suspected catheter malfunction. Michaelle Birks, MD [facility] Electronically Signed   By: Michaelle Birks M.D.   On: 04/14/2021 10:52   DG CHEST PORT 1 VIEW  Result Date: 04/12/2021 CLINICAL DATA:  PICC placement EXAM: PORTABLE CHEST 1 VIEW COMPARISON:  04/10/2021 FINDINGS: Right arm PICC tip in the proximal SVC. Lungs remain clear without infiltrate or effusion. IMPRESSION: PICC tip in the proximal SVC.  Lungs are clear Electronically Signed   By: Franchot Gallo M.D.   On: 04/12/2021 19:55   ECHOCARDIOGRAM COMPLETE  Result Date: 04/14/2021    ECHOCARDIOGRAM REPORT   Patient Name:   Mikayla Rios Date of Exam: 04/14/2021 Medical Rec #:  [id number]      Height:       63.0 in Accession #:    [id number]     Weight:       200.0 lb Date of Birth:  1985/11/13       BSA:          1.934 m Patient Age:    35 years       BP:           109/82 mmHg Patient Gender:  F              HR:           133 bpm. Exam Location:  Inpatient Procedure: 2D Echo, Cardiac Doppler and Color Doppler Indications:    I47.2 Ventricular tachycardia  History:        Patient has prior history of Echocardiogram examinations, most                 recent 11/09/2019. Arrythmias:Prolonged QT interval; Risk                 Factors:Hypertension.  Sonographer:    Glo Herring Referring Phys: Theola Sequin IMPRESSIONS  1. Left ventricular ejection fraction, by estimation, is 60 to 65%. The left ventricle has normal function. The left ventricle  has no regional wall motion abnormalities. Indeterminate diastolic filling due to E-A fusion.  2. Right ventricular systolic function is normal. The right ventricular size is normal.  3. The mitral valve is normal in structure. No evidence of mitral valve regurgitation. No evidence of mitral stenosis.  4. The aortic valve is normal in structure. Aortic valve regurgitation is not visualized. No aortic stenosis is present.  5. The inferior vena cava is normal in size with greater than 50% respiratory variability, suggesting right atrial pressure of 3 mmHg. FINDINGS  Left Ventricle: Left ventricular ejection fraction, by estimation, is 60 to 65%. The left ventricle has normal function. The left ventricle has no regional wall motion abnormalities. The left ventricular internal cavity size was normal in size. There is  no left ventricular hypertrophy. Indeterminate diastolic filling due to E-A fusion. Right Ventricle: The right ventricular size is normal. No increase in right ventricular wall thickness. Right ventricular systolic function is normal. Left Atrium: Left atrial size was normal in size. Right Atrium: Right atrial size was normal in size. Pericardium: There is no evidence of pericardial effusion. Mitral Valve: The mitral valve is normal in structure. No evidence of mitral valve regurgitation. No evidence of mitral valve stenosis. Tricuspid Valve: The tricuspid valve is normal in structure. Tricuspid valve regurgitation is not demonstrated. No evidence of tricuspid stenosis. Aortic Valve: The aortic valve is normal in structure. Aortic valve regurgitation is not visualized. No aortic stenosis is present. Aortic valve mean gradient measures 4.0 mmHg. Aortic valve peak gradient measures 8.6 mmHg. Pulmonic Valve: The pulmonic valve was normal in structure. Pulmonic valve regurgitation is not visualized. No evidence of pulmonic stenosis. Aorta: The aortic root is normal in size and structure. Venous: The inferior  vena cava is normal in size with greater than 50% respiratory variability, suggesting right atrial pressure of 3 mmHg. IAS/Shunts: No atrial level shunt detected by color flow Doppler.  LEFT VENTRICLE PLAX 2D LVIDd:         3.80 cm LVIDs:         2.30 cm LV PW:         1.00 cm LV IVS:        1.00 cm  RIGHT VENTRICLE RV S prime:     21.90 cm/s LEFT ATRIUM             Index LA diam:        3.50 cm 1.81 cm/m LA Vol (A2C):   32.1 ml 16.60 ml/m LA Vol (A4C):   29.5 ml 15.26 ml/m LA Biplane Vol: 32.3 ml 16.70 ml/m  AORTIC VALVE  PULMONIC VALVE AV Vmax:           147.00 cm/s PV Vmax:       1.21 m/s AV Vmean:          96.600 cm/s PV Peak grad:  5.9 mmHg AV VTI:            0.168 m AV Peak Grad:      8.6 mmHg AV Mean Grad:      4.0 mmHg LVOT Vmax:         85.50 cm/s LVOT Vmean:        53.100 cm/s LVOT VTI:          0.097 m LVOT/AV VTI ratio: 0.58  AORTA Ao Root diam: 3.00 cm Ao Asc diam:  2.90 cm  SHUNTS Systemic VTI: 0.10 m Dani Gobble Croitoru MD Electronically signed by Sanda Klein MD Signature Date/Time: 04/14/2021/1:18:01 PM    Final    [place] EKG SITE RITE  Result Date: 04/12/2021 If Site Rite image not attached, placement could not be confirmed due to current cardiac rhythm.       Scheduled Meds:  (feeding supplement) PROSource Plus  30 mL Oral Daily   [START ON 04/15/2021] acetaminophen  1,000 mg Oral On Call to OR   [START ON 04/15/2021] alvimopan  12 mg Oral On Call to OR   bupivacaine liposome  20 mL Infiltration Once   [START ON 04/15/2021] celecoxib  200 mg Oral On Call to OR   Chlorhexidine Gluconate Cloth  6 each Topical Daily   enoxaparin (LOVENOX) injection  40 mg Subcutaneous Q24H   enoxaparin (LOVENOX) injection  40 mg Subcutaneous Once   feeding supplement  1 Container Oral BID BM   feeding supplement  237 mL Oral Q24H   [START ON 04/15/2021] feeding supplement  296 mL Oral Once   feeding supplement  592 mL Oral Once   folic acid  1 mg Oral Daily   [START ON 04/15/2021]  gabapentin  300 mg Oral On Call to OR   lip balm  1 application Topical BID   metoprolol succinate  50 mg Oral BID   multivitamin with minerals  1 tablet Oral Daily   sodium chloride flush  10 mL Intracatheter Q12H   sodium chloride flush  10-40 mL Intracatheter Q12H   Continuous Infusions:  [START ON 04/15/2021] anidulafungin     anidulafungin 200 mg (04/14/21 1113)   clindamycin (CLEOCIN) IV     And   gentamicin     lactated ringers     lactated ringers 75 mL/hr at 04/12/21 0602   meropenem (MERREM) IV Stopped (04/14/21 1044)   methocarbamol (ROBAXIN) IV       LOS: 4 days        Hosie Poisson, MD [facility]   To contact the attending provider between 7A-7P or the covering provider during after hours 7P-7A, please log into the web site [email or web address] and access using universal [place] password for that web site. If you do not have the password, please call the hospital operator.  04/14/2021, 2:23 PM

## 2021-04-14 NOTE — TOC Progression Note (Signed)
Transition of Care [facility]) - Progression Note    Patient Details  Name: Mikayla Rios MRN: [id number] Date of Birth: Sep 25, 1985  [facility]) CM/SW Contact  Leeroy Cha, RN Phone Number: 04/14/2021, 1:27 PM  Clinical Narrative:    Discussed snf choices [facility], [facility] and [facility] with patient.  Decided not to decide at this time stating that she may need surgery and will decide after that.   Expected Discharge Plan: Home/Self Care Barriers to Discharge: Continued Medical Work up  Expected Discharge Plan and Services Expected Discharge Plan: Home/Self Care   Discharge Planning Services: CM Consult   Living arrangements for the past 2 months: Apartment                                       Social Determinants of Health (SDOH) Interventions    Readmission Risk Interventions No flowsheet data found.

## 2021-04-14 NOTE — Plan of Care (Signed)

## 2021-04-14 NOTE — Consult Note (Addendum)
Consultation  Referring Provider: [facility]/ Karleen Hampshire Primary Care Physician:  [name], MD Primary Gastroenterologist:  Dr.Dorsey  Reason for Consultation: Diverticulitis with  recurrent abscesses, question timing of colonoscopy  HPI: Mikayla Rios is a 35 y.o. female who we are asked to see as question of colonoscopy has been raised.  Patient is a 35 year old African-American female with history of morbid obesity, and sleep apnea who had presented with initial episode of diverticulitis complicated by perforation and abscess in May 2022.  She was managed with IR drains x2, and IV antibiotics.  Over the past month she had 3 interval drain exchanges.  She was seen by Dr. Lorenso Courier in our office on 03/12/2021 after being evaluated by surgery.  She had over the previous couple of months gradually had some improvement in symptoms but continued poor appetite and abdominal pain.  There was concern that she had ongoing smoldering diverticulitis.  Initially colonoscopy had been suggested prior to consideration of any resection, she does have family history of colon cancer in her mom diagnosed at age 23.  Patient has not had any prior colonoscopy. She underwent repeat CT abdomen pelvis on 04/01/2021 which unfortunately shows active diverticulitis and wall thickening involving the left colon with some degree of traction and involvement of the adjacent small bowel loops.  She had stable position of one of the drains, and the other drain had retracted into the subcutaneous tissue outside of the peritoneal space.  There was also concern for some degree of malrotation or partial volvulus. Around the time of that CT she says she began to gradually feel worse, she was having progressive problems with weakness, and says she had actually almost fallen the day of the CT on 04/01/2021. By 04/10/2021 she was feeling worse, extremely weak, had rapid heart rate and had fallen at home.  Repeat CT in the ER showed persistent large  abscess involving the lateral abdominal wall, 2 drains in place, the second drain had moved into the subcutaneous tissue.  There was focal wall thickening of the descending colon with surrounding inflammatory stranding, also a small amount of extraluminal gas consistent with perforation creased in comparison to the CT of 04/01/2021.  Also noted prominent central mesenteric nodes which were new.  One of the drains was located within a new lobulated fluid collection measuring 8.5 x 3.5 x 10 cm, again the other was in the subcutaneous tissues There was  fluid from the large abscess extending into the left lateral abdominal wall.  She has been on IV antibiotics since admission, is being followed by surgery who is suggesting that she needs to have surgery as likelihood of her healing without surgery given the complicated abscesses etc. has become less and less likely.  Patient had been reluctant to have surgery.  The retracted drain was removed on 11 5, and yesterday he had drain exchange and upsizing of the second drain located within the large abscess.  She is now having significant output of brown purulent material  Patient says she feels a little bit better since admission and being on IV antibiotics.  Labs-on admission WBC of 15.4-WBC 17.0 today, hemoglobin 7.8/hematocrit 23.8 Be met unremarkable, lactate yesterday within normal limits.  Past Medical History:  Diagnosis Date  . Allergy    seasonal  . Anemia   . Blood infection 1985  . Blood transfusion without reported diagnosis   . Diverticulitis   . Hypertension   . Obesity   . Sleep apnea  Past Surgical History:  Procedure Laterality Date  . IR CATHETER TUBE CHANGE  12/12/2020  . IR CATHETER TUBE CHANGE  01/27/2021  . IR CATHETER TUBE CHANGE  02/19/2021  . IR RADIOLOGIST EVAL & MGMT  11/27/2020  . IR RADIOLOGIST EVAL & MGMT  12/11/2020  . IR RADIOLOGIST EVAL & MGMT  01/07/2021  . IR RADIOLOGIST EVAL & MGMT  02/04/2021  . IR  SINUS/FIST TUBE CHK-NON GI  12/12/2020  . IR SINUS/FIST TUBE CHK-NON GI  02/19/2021    Prior to Admission medications   Medication Sig Start Date End Date Taking? Authorizing Provider  acetaminophen (TYLENOL) 500 MG tablet Take 1,000 mg by mouth every 6 (six) hours as needed for moderate pain or headache.   Yes [provider]  amLODipine (NORVASC) 10 MG tablet TAKE 1 TABLET BY MOUTH EVERY DAY Patient taking differently: Take 10 mg by mouth every evening. 01/19/21  Yes [name], MD  dicyclomine (BENTYL) 10 MG capsule Take 1 capsule (10 mg total) by mouth 3 (three) times daily as needed for spasms. 03/12/21  Yes Sharyn Creamer, MD  lisinopril-hydrochlorothiazide (ZESTORETIC) 20-12.5 MG tablet TAKE 2 TABLETS BY MOUTH EVERY DAY Patient taking differently: Take 2 tablets by mouth daily. 12/10/20  Yes [name], MD  metoprolol succinate (TOPROL-XL) 25 MG 24 hr tablet TAKE 1 TABLET BY MOUTH TWICE A DAY Patient taking differently: Take 25 mg by mouth 2 (two) times daily. 04/10/21  Yes Ghumman, Milford Cage, NP  Prenatal Vit-Fe Fumarate-FA (PRENATAL MULTIVITAMIN) TABS tablet Take 1 tablet by mouth daily at 12 noon.   Yes [provider]  Simethicone (GAS-X PO) Take 1 tablet by mouth daily as needed (gas).   Yes [provider]  traMADol (ULTRAM) 50 MG tablet Take 50 mg by mouth every 6 (six) hours as needed for moderate pain. 12/29/20  Yes [provider]  benzonatate (TESSALON PERLES) 100 MG capsule Take 1 capsule (100 mg total) by mouth every 6 (six) hours as needed. Patient not taking: Reported on 04/10/2021 10/06/20 10/06/21  Minette Brine, FNP  Sodium Chloride Flush (NORMAL SALINE FLUSH) 0.9 % SOLN Flush as directed Patient not taking: Reported on 04/10/2021 11/11/20   Sandi Mariscal, MD  sodium chloride flush (NS) 0.9 % SOLN 5 mLs by Intracatheter route every 8 (eight) hours. Patient not taking: Reported on 04/10/2021 11/11/20   Norm Parcel, PA-C     Current Facility-Administered Medications  Medication Dose Route Frequency Provider Last Rate Last Admin  . (feeding supplement) PROSource Plus liquid 30 mL  30 mL Oral Daily Hosie Poisson, MD   30 mL at 04/13/21 2105  . acetaminophen (TYLENOL) tablet 650 mg  650 mg Oral Q6H PRN Vernelle Emerald, MD   650 mg at 04/13/21 4403   Or  . acetaminophen (TYLENOL) suppository 650 mg  650 mg Rectal Q6H PRN Vernelle Emerald, MD      . Derrill Memo ON 04/15/2021] anidulafungin (ERAXIS) 100 mg in sodium chloride 0.9 % 100 mL IVPB  100 mg Intravenous Q24H Maczis, Michael M, PA-C      . anidulafungin (ERAXIS) 200 mg in sodium chloride 0.9 % 200 mL IVPB  200 mg Intravenous Once Jillyn Ledger, PA-C      . Chlorhexidine Gluconate Cloth 2 % PADS 6 each  6 each Topical Daily Hosie Poisson, MD   6 each at 04/13/21 1122  . dicyclomine (BENTYL) capsule 10 mg  10 mg Oral TID PRN Shalhoub, Sherryll Burger, MD      .  enoxaparin (LOVENOX) injection 40 mg  40 mg Subcutaneous Q24H Hosie Poisson, MD   40 mg at 04/13/21 1721  . feeding supplement (BOOST / RESOURCE BREEZE) liquid 1 Container  1 Container Oral BID BM Hosie Poisson, MD   1 Container at 04/13/21 1733  . feeding supplement (ENSURE ENLIVE / ENSURE PLUS) liquid 237 mL  237 mL Oral Q24H Hosie Poisson, MD      . folic acid (FOLVITE) tablet 1 mg  1 mg Oral Daily Hosie Poisson, MD   1 mg at 04/13/21 0907  . lactated ringers infusion   Intravenous Continuous Hosie Poisson, MD 75 mL/hr at 04/12/21 0602 New Bag at 04/12/21 0602  . melatonin tablet 10 mg  10 mg Oral QHS PRN Shalhoub, Sherryll Burger, MD      . meropenem (MERREM) 1 g in sodium chloride 0.9 % 100 mL IVPB  1 g Intravenous Q8H Hosie Poisson, MD 200 mL/hr at 04/14/21 0101 1 g at 04/14/21 0101  . metoprolol succinate (TOPROL-XL) 24 hr tablet 50 mg  50 mg Oral BID Hosie Poisson, MD      . metoprolol tartrate (LOPRESSOR) injection 2.5 mg  2.5 mg Intravenous Q8H PRN Hosie Poisson, MD      . morphine 2 MG/ML injection 2 mg   2 mg Intravenous Q4H PRN Shalhoub, Sherryll Burger, MD   2 mg at 04/14/21 0835  . multivitamin with minerals tablet 1 tablet  1 tablet Oral Daily Hosie Poisson, MD   1 tablet at 04/13/21 1733  . oxyCODONE (Oxy IR/ROXICODONE) immediate release tablet 5-10 mg  5-10 mg Oral Q4H PRN Maczis, Barth Kirks, PA-C      . simethicone (MYLICON) chewable tablet 80 mg  80 mg Oral Daily PRN Vernelle Emerald, MD   80 mg at 04/12/21 1915  . sodium chloride flush (NS) 0.9 % injection 10 mL  10 mL Intracatheter Q12H Soyla Dryer R, NP   10 mL at 04/13/21 2057  . sodium chloride flush (NS) 0.9 % injection 10-40 mL  10-40 mL Intracatheter Q12H Hosie Poisson, MD   10 mL at 04/13/21 2056  . sodium chloride flush (NS) 0.9 % injection 10-40 mL  10-40 mL Intracatheter PRN Hosie Poisson, MD        Allergies as of 04/10/2021 - Review Complete 04/10/2021  Allergen Reaction Noted  . Shellfish allergy Hives 11/22/2012  . Penicillins Hives and Rash 11/22/2012    Family History  Problem Relation Age of Onset  . Cancer Mother        breast, and in remission  . Diabetes Mother   . Hypertension Mother   . Colon cancer Mother        dx at age 59  . Congestive Heart Failure Father   . Hypertension Father   . Diabetes Maternal Grandmother   . Hypertension Maternal Grandmother   . Diabetes Maternal Grandfather   . Kidney disease Maternal Grandfather   . Diabetes Paternal Grandmother   . Cancer Paternal Grandfather        colon  . Autism Son   . Heart disease Paternal Aunt   . Cancer Paternal Uncle        colon    Social History   Socioeconomic History  . Marital status: Married    Spouse name: Roderick Pee  . Number of children: 3  . Years of education: Not on file  . Highest education level: Not on file  Occupational History  . Not on file  Tobacco Use  .  Smoking status: Never  . Smokeless tobacco: Never  Vaping Use  . Vaping Use: Never used  Substance and Sexual Activity  . Alcohol use: Not Currently  . Drug  use: No  . Sexual activity: Yes    Partners: Male    Birth control/protection: None  Other Topics Concern  . Not on file  Social History Narrative  . Not on file   Social Determinants of Health   Financial Resource Strain: Not on file  Food Insecurity: Not on file  Transportation Needs: Not on file  Physical Activity: Not on file  Stress: Not on file  Social Connections: Not on file  Intimate Partner Violence: Not on file    Review of Systems: Pertinent positive and negative review of systems were noted in the above HPI section.  All other review of systems was otherwise negative.   Physical Exam: Vital signs in last 24 hours: Temp:  [98.2 F (36.8 C)-98.8 F (37.1 C)] 98.8 F (37.1 C) (11/08 0429) Pulse Rate:  [116-121] 121 (11/08 0429) Resp:  [16-18] 18 (11/08 0429) BP: (93-136)/(69-100) 119/92 (11/08 0829) SpO2:  [100 %] 100 % (11/08 0429) Last BM Date: 04/14/21 General:   Alert,  Well-developed, morbidly obese young African-American female, chronically ill-appearing, pleasant and cooperative in NAD Head:  Normocephalic and atraumatic. Eyes:  Sclera clear, no icterus.   Conjunctiva pale Ears:  Normal auditory acuity. Nose:  No deformity, discharge,  or lesions. Mouth:  No deformity or lesions.   Neck:  Supple; no masses or thyromegaly. Lungs:  Clear throughout to auscultation.   No wheezes, crackles, or rhonchi.  Heart: Tachycardic regular rate  and rhythm; no murmurs, clicks, rubs,  or gallops. Abdomen:  Soft, morbidly obese, she is tender in the lower mid abdomen and into the left lower and left mid quadrant, BS active, no palpable mass or hepatosplenomegaly, percutaneous drain in the left lower quadrant with purulent brown effluent Rectal:  Deferred  Msk:  Symmetrical without gross deformities. . Pulses:  Normal pulses noted. Extremities:  Without clubbing or edema. Neurologic:  Alert and  oriented x4;  grossly normal neurologically. Skin:  Intact without  significant lesions or rashes.. Psych:  Alert and cooperative. Normal mood and affect.  Intake/Output from previous day: 11/07 0701 - 11/08 0700 In: 800 [P.O.:480; I.V.:20; IV Piggyback:300] Out: -  Intake/Output this shift: Total I/O In: 120 [P.O.:120] Out: 650 [Urine:650]  Lab Results: Recent Labs    04/12/21 0525 04/13/21 0546 04/14/21 0327  WBC 8.8 16.1* 17.0*  HGB 7.1* 8.5* 7.8*  HCT 22.7* 26.0* 23.8*  PLT 433* 514* 449*   BMET Recent Labs    04/12/21 0525 04/13/21 0546  NA 136 136  K 3.8 3.5  CL 103 105  CO2 26 26  GLUCOSE 81 82  BUN 12 11  CREATININE 0.40* 0.45  CALCIUM 7.3* 7.2*   LFT No results for input(s): PROT, ALBUMIN, AST, ALT, ALKPHOS, BILITOT, BILIDIR, IBILI in the last 72 hours. PT/INR No results for input(s): LABPROT, INR in the last 72 hours. Hepatitis Panel No results for input(s): HEPBSAG, HCVAB, HEPAIGM, HEPBIGM in the last 72 hours.    IMPRESSION:   #79 35 year old African-American female who presented with initial episode of complicated diverticulitis with perforation May 2022, who has not been well since.  She has required continued percutaneous drains, and now over the past 2 weeks has had significant worsening, with profound weakness, tachycardia, leukocytosis all consistent with SIRS and progressive diverticulitis, with recurrent microperforation and development  of new large abscess.   Patient is not appropriate for colonoscopy at this time as risk of worsening perforation would be quite high. Not sure how colonoscopy would change management at this time  Patient needs to have surgery once this acute process cools down, with resection of diseased bowel, and temporary colostomy.  I am concerned with her current status that she may indeed need to have surgery prior to the acute process going down if she does not have significant improvement in the next few days. Long discussion with her today regarding her situation, and need for surgery  and temporary colostomy.  She voices understanding and is not really resisting at this point.  She understands that continued placement of percutaneous drains is not fixing the underlying complicated process.  GI will be available if can help.    Amy Esterwood PA-C 04/14/2021, 9:18 AM     [facility] Attending   I have taken an interval history, reviewed the chart and examined the patient. I agree with the Advanced Practitioner's note, impression and recommendations.  Majority the medical decision-making in the formulation of the assessment and plan were performed by me.  Colonoscopy not in her best interest right now.  [name], MD, [facility] 04/14/2021 4:43 PM

## 2021-04-14 NOTE — H&P (View-Only) (Signed)
Subjective: CC: Having more pain in her LLQ that is severe. She reports she has crampy pain after eating. Some nausea yesterday, none today. Denies emesis. BM this morning.   Objective: Vital signs in last 24 hours: Temp:  [98.2 F (36.8 C)-98.9 F (37.2 C)] 98.9 F (37.2 C) (11/08 1016) Pulse Rate:  [116-121] 121 (11/08 0429) Resp:  [16-18] 18 (11/08 1016) BP: (93-136)/(69-100) 109/82 (11/08 1016) SpO2:  [98 %-100 %] 98 % (11/08 1016) Last BM Date: 04/14/21  Intake/Output from previous day: 11/07 0701 - 11/08 0700 In: 800 [P.O.:480; I.V.:20; IV Piggyback:300] Out: -  Intake/Output this shift: Total I/O In: 360 [P.O.:360] Out: 650 [Urine:650]  PE: Gen:  Alert, NAD, pleasant Card: Tachycardic Pulm:  Normal rate and effort Abd: Soft, obese but appears ND. More tender in the left mid to lower abdomen compared to yesterday but without rigidity or guarding. +BS. LLQ drain with thin brown feculent/purulent output.   Lab Results:  Recent Labs    04/13/21 0546 04/14/21 0327  WBC 16.1* 17.0*  HGB 8.5* 7.8*  HCT 26.0* 23.8*  PLT 514* 449*   BMET Recent Labs    04/12/21 0525 04/13/21 0546  NA 136 136  K 3.8 3.5  CL 103 105  CO2 26 26  GLUCOSE 81 82  BUN 12 11  CREATININE 0.40* 0.45  CALCIUM 7.3* 7.2*   PT/INR No results for input(s): LABPROT, INR in the last 72 hours. CMP     Component Value Date/Time   NA 136 04/13/2021 0546   NA 139 12/21/2019 1024   K 3.5 04/13/2021 0546   CL 105 04/13/2021 0546   CO2 26 04/13/2021 0546   GLUCOSE 82 04/13/2021 0546   BUN 11 04/13/2021 0546   BUN 13 12/21/2019 1024   CREATININE 0.45 04/13/2021 0546   CALCIUM 7.2 (L) 04/13/2021 0546   PROT 6.2 (L) 04/11/2021 0302   PROT 7.4 07/11/2019 1638   ALBUMIN 1.7 (L) 04/11/2021 0302   ALBUMIN 4.6 07/11/2019 1638   AST 16 04/11/2021 0302   ALT 19 04/11/2021 0302   ALKPHOS 147 (H) 04/11/2021 0302   BILITOT 0.7 04/11/2021 0302   BILITOT 0.2 07/11/2019 1638   GFRNONAA  >60 04/13/2021 0546   GFRAA 96 12/21/2019 1024   Lipase     Component Value Date/Time   LIPASE 21 11/04/2020 1119    Studies/Results: IR Catheter Tube Change  Result Date: 04/14/2021 CLINICAL DATA:  Large intra-abscess. malfunctioning indwelling drain. EXAM: IR DRAINAGE CATHETER EVALUATION, EXCHANGE AND UPSIZE COMPARISON:  CT Abdomen Pelvis, 04/11/2019. IR fluoroscopy, 02/19/2021. CONTRAST:  10 mL Omnipaque 300-administered via the percutaneous drainage catheter. MEDICATIONS: None. ANESTHESIA/SEDATION: Local anesthetic was administered. FLUOROSCOPY TIME:  3 minutes 33 seconds.  10 mGy. TECHNIQUE: Patient was positioned supine on the fluoroscopy table. The external portion of the existing percutaneous drainage catheter as well as the surrounding skin was prepped and draped in usual sterile fashion. A preprocedural spot fluoroscopic image was obtained of the existing percutaneous drainage catheter. A small amount of contrast was injected via the existing percutaneous drainage catheter and several fluoroscopic images were obtained in various obliquities. The external portion of the percutaneous drainage catheter was cut and cannulated with a short Amplatz wire. Under intermittent fluoroscopic guidance, the existing percutaneous drainage catheter was exchanged for a new 50 French percutaneous drainage catheter with end coiled and locked within the decompressed abscess cavity. Contrast injection confirmed appropriate position functionality of the percutaneous drainage catheter. The percutaneous drainage  catheter was connected to a gravity drainage bag and secured in place within 0-silk interrupted suture and a StatLock device. A dressing was applied. The patient tolerated the procedure well without immediate postprocedural complication. FINDINGS: 1. Malfunctioning drain with large residual intra-abdominal abscess. 2. Multi compartment abscess, with patient unable to tolerate further investigation and multiple  drain placement. IMPRESSION: Successful fluoroscopic-guided LEFT lower quadrant drainage catheter exchange and upsize, as above. PLAN: Continue with previous drain care, including routine flushes for catheter patency. Interventional radiology will follow patient. Drain interrogation to be performed in 2 weeks, or if earlier suspected catheter malfunction. Michaelle Birks, MD [facility] Radiology Electronically Signed   By: Michaelle Birks M.D.   On: 04/14/2021 10:52   DG CHEST PORT 1 VIEW  Result Date: 04/12/2021 CLINICAL DATA:  PICC placement EXAM: PORTABLE CHEST 1 VIEW COMPARISON:  04/10/2021 FINDINGS: Right arm PICC tip in the proximal SVC. Lungs remain clear without infiltrate or effusion. IMPRESSION: PICC tip in the proximal SVC.  Lungs are clear Electronically Signed   By: Franchot Gallo M.D.   On: 04/12/2021 19:55   [place] EKG SITE RITE  Result Date: 04/12/2021 If Site Rite image not attached, placement could not be confirmed due to current cardiac rhythm.   Anti-infectives: Anti-infectives (From admission, onward)    Start     Dose/Rate Route Frequency Ordered Stop   04/15/21 1000  anidulafungin (ERAXIS) 100 mg in sodium chloride 0.9 % 100 mL IVPB        100 mg 78 mL/hr over 100 Minutes Intravenous Every 24 hours 04/14/21 0744     04/14/21 1000  anidulafungin (ERAXIS) 200 mg in sodium chloride 0.9 % 200 mL IVPB        200 mg 78 mL/hr over 200 Minutes Intravenous  Once 04/14/21 0755     04/13/21 0900  meropenem (MERREM) 1 g in sodium chloride 0.9 % 100 mL IVPB        1 g 200 mL/hr over 30 Minutes Intravenous Every 8 hours 04/13/21 0800     04/10/21 2200  cefTRIAXone (ROCEPHIN) 2 g in sodium chloride 0.9 % 100 mL IVPB  Status:  Discontinued        2 g 200 mL/hr over 30 Minutes Intravenous Daily at bedtime 04/10/21 2044 04/13/21 0746   04/10/21 2200  metroNIDAZOLE (FLAGYL) IVPB 500 mg  Status:  Discontinued        500 mg 100 mL/hr over 60  Minutes Intravenous Every 12 hours 04/10/21 2044 04/13/21 0745        Assessment/Plan Complicated diverticulitis with abscess - Originally admitted from 5/31-6/6 with perforated diverticulitis with an abscess.  Underwent CT guided drain placement x2 on 6/3 and was discharged on cefdinir and flagyl based on culture results.  Follow-up CT scan did show direct communication between drain and small bowel. She has had numerous drain exchanges. She had followed up with Dr. Marcello Moores in the office for this and was referred for colonoscopy to rule out concern for possible mass.  She has been seen by Dr. Lorenso Courier of GI but has not yet undergone colonoscopy -CT 11/4 showed descending colon diverticulitis with perforation and new fluid collection posterior to inflamed colon measuring 8.5 x 3.5 x 10 cm extending into the left lateral abdominal wall.  The tip of the drain catheter was in this collection.  IR upsized drain 11/7. Now feculant.  The anterior PERC drain was in the subcu tissues and was removed by IR on  11/5.  - Cont IV abx (Merrem and Eraxis)  - Despite broad-spectrum antibiotics and IR drain upsizing - the patient has increasing left lower quadrant abdominal pain, persistent tachycardia and rising WBC. Drain appears feculent. Will back down her diet to CLD.  We discussed surgery including colectomy and colostomy.  Patient reports that she has discussed with her family is now open to proceeding with surgery during admission.  During discussion of the surgery, the patient became anxious and asked I come back later to finish discussion. My attending states he will plan to have a more detailed discussion with her today.  Will make n.p.o. at midnight. I have asked TR[facility] to ensure patient is medically optimized and has medical clearance in case she requires surgery during admission.    FEN - CLD, NPO at midnight VTE - SCDs, Lovenox ID - Merrem, Eraxis   Pyuria - UCx w/ E. Coli Tachycardia - TRH getting  echo Syncope - TRH working up   LOS: 4 days    Jillyn Ledger , [facility] Surgery 04/14/2021, 11:12 AM Please see Amion for pager number during day hours 7:00am-4:30pm

## 2021-04-14 NOTE — Progress Notes (Signed)
Inpatient Rehab Admissions Coordinator:   Surgery notes possible plan for procedure in the next few days, Pt. Is not yet medically ready for CIR. I will continue to follow for potential admit pending medical readiness and bed availability.   Clemens Catholic, [place], [place] Admissions Coordinator  [phone] ([place]) [phone] (office)

## 2021-04-15 ENCOUNTER — Inpatient Hospital Stay (HOSPITAL_COMMUNITY): Payer: BC Managed Care – PPO

## 2021-04-15 ENCOUNTER — Inpatient Hospital Stay (HOSPITAL_COMMUNITY): Payer: BC Managed Care – PPO | Admitting: Anesthesiology

## 2021-04-15 ENCOUNTER — Encounter (HOSPITAL_COMMUNITY): Payer: Self-pay | Admitting: Internal Medicine

## 2021-04-15 ENCOUNTER — Encounter (HOSPITAL_COMMUNITY)
Admission: EM | Disposition: A | Discharge status: Rehabilitation Facility | Payer: Self-pay | Source: Home / Self Care | Attending: Internal Medicine

## 2021-04-15 DIAGNOSIS — Z978 Presence of other specified devices: Secondary | ICD-10-CM | POA: Diagnosis not present

## 2021-04-15 DIAGNOSIS — K5720 Diverticulitis of large intestine with perforation and abscess without bleeding: Secondary | ICD-10-CM | POA: Diagnosis not present

## 2021-04-15 DIAGNOSIS — R000 Tachycardia, unspecified: Secondary | ICD-10-CM | POA: Diagnosis not present

## 2021-04-15 DIAGNOSIS — Z01810 Encounter for preprocedural cardiovascular examination: Secondary | ICD-10-CM | POA: Diagnosis not present

## 2021-04-15 DIAGNOSIS — D649 Anemia, unspecified: Secondary | ICD-10-CM | POA: Diagnosis not present

## 2021-04-15 DIAGNOSIS — Z9911 Dependence on respirator [ventilator] status: Secondary | ICD-10-CM | POA: Diagnosis not present

## 2021-04-15 DIAGNOSIS — J9601 Acute respiratory failure with hypoxia: Secondary | ICD-10-CM

## 2021-04-15 DIAGNOSIS — K5780 Diverticulitis of intestine, part unspecified, with perforation and abscess without bleeding: Secondary | ICD-10-CM | POA: Diagnosis not present

## 2021-04-15 DIAGNOSIS — I10 Essential (primary) hypertension: Secondary | ICD-10-CM | POA: Diagnosis not present

## 2021-04-15 HISTORY — PX: COLECTOMY WITH COLOSTOMY CREATION/HARTMANN PROCEDURE: SHX6598

## 2021-04-15 HISTORY — PX: LAPAROSCOPIC PARTIAL COLECTOMY: SHX5907

## 2021-04-15 LAB — GLUCOSE, CAPILLARY: Glucose-Capillary: 140 mg/dL — ABNORMAL HIGH (ref 70–99)

## 2021-04-15 LAB — MAGNESIUM
Magnesium: 1.3 mg/dL — ABNORMAL LOW (ref 1.7–2.4)
Magnesium: 1.3 mg/dL — ABNORMAL LOW (ref 1.7–2.4)

## 2021-04-15 LAB — COMPREHENSIVE METABOLIC PANEL
ALT: 13 U/L (ref 0–44)
AST: 22 U/L (ref 15–41)
Albumin: 1.9 g/dL — ABNORMAL LOW (ref 3.5–5.0)
Alkaline Phosphatase: 71 U/L (ref 38–126)
Anion gap: 5 (ref 5–15)
BUN: 10 mg/dL (ref 6–20)
CO2: 24 mmol/L (ref 22–32)
Calcium: 7.4 mg/dL — ABNORMAL LOW (ref 8.9–10.3)
Chloride: 106 mmol/L (ref 98–111)
Creatinine, Ser: 0.35 mg/dL — ABNORMAL LOW (ref 0.44–1.00)
GFR, Estimated: >60 mL/min (ref >60)
Glucose, Bld: 150 mg/dL — ABNORMAL HIGH (ref 70–99)
Potassium: 3.7 mmol/L (ref 3.5–5.1)
Sodium: 135 mmol/L (ref 135–145)
Total Bilirubin: 1.1 mg/dL (ref 0.3–1.2)
Total Protein: 4.2 g/dL — ABNORMAL LOW (ref 6.5–8.1)

## 2021-04-15 LAB — POCT I-STAT 7, (LYTES, BLD GAS, ICA,H+H)
Acid-Base Excess: 3 mmol/L — ABNORMAL HIGH (ref 0.0–2.0)
Bicarbonate: 29.4 mmol/L — ABNORMAL HIGH (ref 20.0–28.0)
Calcium, Ion: 1.2 mmol/L (ref 1.15–1.40)
HCT: 20 % — ABNORMAL LOW (ref 36.0–46.0)
Hemoglobin: 6.8 g/dL — CL (ref 12.0–15.0)
O2 Saturation: 100 %
Potassium: 3.7 mmol/L (ref 3.5–5.1)
Sodium: 141 mmol/L (ref 135–145)
TCO2: 31 mmol/L (ref 22–32)
pCO2 arterial: 56.5 mmHg — ABNORMAL HIGH (ref 32.0–48.0)
pH, Arterial: 7.325 — ABNORMAL LOW (ref 7.350–7.450)
pO2, Arterial: 421 mmHg — ABNORMAL HIGH (ref 83.0–108.0)

## 2021-04-15 LAB — BASIC METABOLIC PANEL
Anion gap: 5 (ref 5–15)
BUN: 7 mg/dL (ref 6–20)
CO2: 27 mmol/L (ref 22–32)
Calcium: 7.1 mg/dL — ABNORMAL LOW (ref 8.9–10.3)
Chloride: 104 mmol/L (ref 98–111)
Creatinine, Ser: 0.43 mg/dL — ABNORMAL LOW (ref 0.44–1.00)
GFR, Estimated: >60 mL/min (ref >60)
Glucose, Bld: 83 mg/dL (ref 70–99)
Potassium: 3.4 mmol/L — ABNORMAL LOW (ref 3.5–5.1)
Sodium: 136 mmol/L (ref 135–145)

## 2021-04-15 LAB — BLOOD GAS, ARTERIAL
Acid-Base Excess: 0.4 mmol/L (ref 0.0–2.0)
Bicarbonate: 24.5 mmol/L (ref 20.0–28.0)
Drawn by: 560031
FIO2: 50
MECHVT: 420 mL
O2 Saturation: 99.9 %
PEEP: 5 cmH2O
Patient temperature: 98.6
RATE: 16 resp/min
pCO2 arterial: 39.8 mmHg (ref 32.0–48.0)
pH, Arterial: 7.407 (ref 7.350–7.450)
pO2, Arterial: 225 mmHg — ABNORMAL HIGH (ref 83.0–108.0)

## 2021-04-15 LAB — CBC
HCT: 23 % — ABNORMAL LOW (ref 36.0–46.0)
HCT: 28.9 % — ABNORMAL LOW (ref 36.0–46.0)
Hemoglobin: 7.5 g/dL — ABNORMAL LOW (ref 12.0–15.0)
Hemoglobin: 9.7 g/dL — ABNORMAL LOW (ref 12.0–15.0)
MCH: 29.4 pg (ref 26.0–34.0)
MCH: 30.1 pg (ref 26.0–34.0)
MCHC: 32.6 g/dL (ref 30.0–36.0)
MCHC: 33.6 g/dL (ref 30.0–36.0)
MCV: 90.2 fL (ref 80.0–100.0)
MCV: 89.8 fL (ref 80.0–100.0)
Platelets: 407 10*3/uL — ABNORMAL HIGH (ref 150–400)
Platelets: 363 10*3/uL (ref 150–400)
RBC: 2.55 MIL/uL — ABNORMAL LOW (ref 3.87–5.11)
RBC: 3.22 MIL/uL — ABNORMAL LOW (ref 3.87–5.11)
RDW: 22.2 % — ABNORMAL HIGH (ref 11.5–15.5)
RDW: 17.8 % — ABNORMAL HIGH (ref 11.5–15.5)
WBC: 13.2 10*3/uL — ABNORMAL HIGH (ref 4.0–10.5)
WBC: 22.3 10*3/uL — ABNORMAL HIGH (ref 4.0–10.5)
nRBC: 0 % (ref 0.0–0.2)
nRBC: 0 % (ref 0.0–0.2)

## 2021-04-15 LAB — CULTURE, BLOOD (ROUTINE X 2)
Culture: NO GROWTH
Special Requests: ADEQUATE

## 2021-04-15 LAB — PREALBUMIN: Prealbumin: 7.3 mg/dL — ABNORMAL LOW (ref 18–38)

## 2021-04-15 LAB — PREPARE RBC (CROSSMATCH)

## 2021-04-15 LAB — PHOSPHORUS: Phosphorus: 4.5 mg/dL (ref 2.5–4.6)

## 2021-04-15 SURGERY — LAPAROSCOPIC PARTIAL COLECTOMY
Anesthesia: General | Site: Abdomen

## 2021-04-15 MED ORDER — SODIUM CHLORIDE 0.9 % IV SOLN: INTRAVENOUS | Status: DC | PRN

## 2021-04-15 MED ORDER — PHENYLEPHRINE 40 MCG/ML (10ML) SYRINGE FOR IV PUSH (FOR BLOOD PRESSURE SUPPORT)
PREFILLED_SYRINGE | INTRAVENOUS | Status: DC | PRN
  Administered 2021-04-15 (×2): 120 ug via INTRAVENOUS
  Administered 2021-04-15: 80 ug via INTRAVENOUS
  Administered 2021-04-15 (×2): 100 ug via INTRAVENOUS
  Administered 2021-04-15: 80 ug via INTRAVENOUS

## 2021-04-15 MED ORDER — 0.9 % SODIUM CHLORIDE (POUR BTL) OPTIME
TOPICAL | Status: DC | PRN
  Administered 2021-04-15 (×5): 1000 mL

## 2021-04-15 MED ORDER — ESMOLOL HCL 100 MG/10ML IV SOLN
INTRAVENOUS | Status: AC
  Filled 2021-04-15: qty 10

## 2021-04-15 MED ORDER — FENTANYL CITRATE (PF) 100 MCG/2ML IJ SOLN
INTRAMUSCULAR | Status: AC
  Filled 2021-04-15: qty 2

## 2021-04-15 MED ORDER — DEXAMETHASONE SODIUM PHOSPHATE 10 MG/ML IJ SOLN
INTRAMUSCULAR | Status: AC
  Filled 2021-04-15: qty 1

## 2021-04-15 MED ORDER — FENTANYL CITRATE (PF) 100 MCG/2ML IJ SOLN: 50.0000 ug | Freq: Once | INTRAMUSCULAR | Status: DC

## 2021-04-15 MED ORDER — OXYCODONE HCL 5 MG/5ML PO SOLN: 5.0000 mg | Freq: Once | ORAL | Status: DC | PRN

## 2021-04-15 MED ORDER — VASOPRESSIN 20 UNIT/ML IV SOLN
INTRAVENOUS | Status: DC | PRN
  Administered 2021-04-15: .04 [IU]/min via INTRAVENOUS

## 2021-04-15 MED ORDER — PHENYLEPHRINE 40 MCG/ML (10ML) SYRINGE FOR IV PUSH (FOR BLOOD PRESSURE SUPPORT)
PREFILLED_SYRINGE | INTRAVENOUS | Status: AC
  Filled 2021-04-15: qty 10

## 2021-04-15 MED ORDER — OXYCODONE HCL 5 MG PO TABS: 5.0000 mg | ORAL_TABLET | Freq: Once | ORAL | Status: DC | PRN

## 2021-04-15 MED ORDER — DEXMEDETOMIDINE (PRECEDEX) IN NS 20 MCG/5ML (4 MCG/ML) IV SYRINGE
PREFILLED_SYRINGE | INTRAVENOUS | Status: AC
  Filled 2021-04-15: qty 5

## 2021-04-15 MED ORDER — LIDOCAINE HCL (PF) 2 % IJ SOLN
INTRAMUSCULAR | Status: AC
  Filled 2021-04-15: qty 5

## 2021-04-15 MED ORDER — SODIUM CHLORIDE 0.9 % IV SOLN
10.0000 mL/h | Freq: Once | INTRAVENOUS | Status: AC
  Administered 2021-04-15: 10 mL/h via INTRAVENOUS

## 2021-04-15 MED ORDER — FENTANYL BOLUS VIA INFUSION
50.0000 ug | INTRAVENOUS | Status: DC | PRN
  Filled 2021-04-15: qty 100

## 2021-04-15 MED ORDER — EPINEPHRINE 1 MG/10ML IJ SOSY
PREFILLED_SYRINGE | INTRAMUSCULAR | Status: DC | PRN
  Administered 2021-04-15: .2 mg via INTRAVENOUS

## 2021-04-15 MED ORDER — ALBUMIN HUMAN 5 % IV SOLN
INTRAVENOUS | Status: AC
  Filled 2021-04-15: qty 250

## 2021-04-15 MED ORDER — DEXAMETHASONE SODIUM PHOSPHATE 10 MG/ML IJ SOLN
INTRAMUSCULAR | Status: DC | PRN
  Administered 2021-04-15: 10 mg via INTRAVENOUS

## 2021-04-15 MED ORDER — CHLORHEXIDINE GLUCONATE 0.12% ORAL RINSE (MEDLINE KIT)
15.0000 mL | Freq: Two times a day (BID) | OROMUCOSAL | Status: DC
  Administered 2021-04-15 – 2021-04-22 (×11): 15 mL via OROMUCOSAL

## 2021-04-15 MED ORDER — LIDOCAINE HCL (PF) 2 % IJ SOLN
INTRAMUSCULAR | Status: DC | PRN
  Administered 2021-04-15: 1.5 mg/kg/h via INTRADERMAL

## 2021-04-15 MED ORDER — PROPOFOL 1000 MG/100ML IV EMUL
0.0000 ug/kg/min | INTRAVENOUS | Status: DC
  Administered 2021-04-15: 20 ug/kg/min via INTRAVENOUS
  Administered 2021-04-16 (×2): 30 ug/kg/min via INTRAVENOUS
  Filled 2021-04-15 (×2): qty 100

## 2021-04-15 MED ORDER — BUPIVACAINE-EPINEPHRINE 0.25% -1:200000 IJ SOLN
INTRAMUSCULAR | Status: DC | PRN
  Administered 2021-04-15: 60 mL

## 2021-04-15 MED ORDER — ORAL CARE MOUTH RINSE: 15.0000 mL | Freq: Once | OROMUCOSAL | Status: DC

## 2021-04-15 MED ORDER — NOREPINEPHRINE 4 MG/250ML-% IV SOLN
INTRAVENOUS | Status: DC | PRN
  Administered 2021-04-15: 5 ug/kg/min via INTRAVENOUS

## 2021-04-15 MED ORDER — ONDANSETRON HCL 4 MG/2ML IJ SOLN
INTRAMUSCULAR | Status: DC | PRN
  Administered 2021-04-15: 4 mg via INTRAVENOUS

## 2021-04-15 MED ORDER — EPHEDRINE SULFATE 50 MG/ML IJ SOLN
INTRAMUSCULAR | Status: DC | PRN
  Administered 2021-04-15: 20 mg via INTRAVENOUS
  Administered 2021-04-15: 5 mg via INTRAVENOUS
  Administered 2021-04-15: 10 mg via INTRAVENOUS

## 2021-04-15 MED ORDER — EPHEDRINE 5 MG/ML INJ
INTRAVENOUS | Status: AC
  Filled 2021-04-15: qty 5

## 2021-04-15 MED ORDER — FENTANYL CITRATE (PF) 100 MCG/2ML IJ SOLN
INTRAMUSCULAR | Status: DC | PRN
  Administered 2021-04-15 (×2): 50 ug via INTRAVENOUS
  Administered 2021-04-15: 150 ug via INTRAVENOUS
  Administered 2021-04-15: 50 ug via INTRAVENOUS
  Administered 2021-04-15: 100 ug via INTRAVENOUS
  Administered 2021-04-15: 50 ug via INTRAVENOUS

## 2021-04-15 MED ORDER — LIDOCAINE HCL 2 % IJ SOLN
INTRAMUSCULAR | Status: AC
  Filled 2021-04-15: qty 20

## 2021-04-15 MED ORDER — DEXMEDETOMIDINE (PRECEDEX) IN NS 20 MCG/5ML (4 MCG/ML) IV SYRINGE
PREFILLED_SYRINGE | INTRAVENOUS | Status: DC | PRN
  Administered 2021-04-15: 8 ug via INTRAVENOUS

## 2021-04-15 MED ORDER — AMISULPRIDE (ANTIEMETIC) 5 MG/2ML IV SOLN: 10.0000 mg | Freq: Once | INTRAVENOUS | Status: DC | PRN

## 2021-04-15 MED ORDER — DOCUSATE SODIUM 50 MG/5ML PO LIQD: 100.0000 mg | Freq: Two times a day (BID) | ORAL | Status: DC

## 2021-04-15 MED ORDER — VASOPRESSIN 20 UNIT/ML IV SOLN
INTRAVENOUS | Status: DC | PRN
  Administered 2021-04-15 (×2): 2 [IU] via INTRAVENOUS

## 2021-04-15 MED ORDER — LIDOCAINE HCL (CARDIAC) PF 100 MG/5ML IV SOSY
PREFILLED_SYRINGE | INTRAVENOUS | Status: DC | PRN
Start: 2021-04-15 — End: 2021-04-15
  Administered 2021-04-15: 50 mg via INTRAVENOUS

## 2021-04-15 MED ORDER — POTASSIUM CHLORIDE 10 MEQ/100ML IV SOLN: 10.0000 meq | INTRAVENOUS | Status: AC

## 2021-04-15 MED ORDER — FENTANYL 2500MCG IN NS 250ML (10MCG/ML) PREMIX INFUSION
50.0000 ug/h | INTRAVENOUS | Status: DC
Start: 2021-04-15 — End: 2021-04-16
  Administered 2021-04-15: 50 ug/h via INTRAVENOUS
  Filled 2021-04-15: qty 250

## 2021-04-15 MED ORDER — MIDAZOLAM HCL 2 MG/2ML IJ SOLN
INTRAMUSCULAR | Status: AC
  Filled 2021-04-15: qty 2

## 2021-04-15 MED ORDER — VASOPRESSIN 20 UNIT/ML IV SOLN
INTRAVENOUS | Status: AC
  Filled 2021-04-15: qty 1

## 2021-04-15 MED ORDER — NOREPINEPHRINE 4 MG/250ML-% IV SOLN
0.0000 ug/min | INTRAVENOUS | Status: DC
  Administered 2021-04-15: 10 ug/min via INTRAVENOUS
  Administered 2021-04-16: 14 ug/min via INTRAVENOUS
  Administered 2021-04-16: 16 ug/min via INTRAVENOUS
  Filled 2021-04-15 (×4): qty 250

## 2021-04-15 MED ORDER — FENTANYL CITRATE PF 50 MCG/ML IJ SOSY: 50.0000 ug | PREFILLED_SYRINGE | Freq: Once | INTRAMUSCULAR | Status: DC

## 2021-04-15 MED ORDER — PHENYLEPHRINE HCL-NACL 20-0.9 MG/250ML-% IV SOLN
INTRAVENOUS | Status: AC
  Filled 2021-04-15: qty 250

## 2021-04-15 MED ORDER — VASOPRESSIN 20 UNITS/100 ML INFUSION FOR SHOCK
0.0000 [IU]/min | INTRAVENOUS | Status: DC
  Administered 2021-04-16: 0.03 [IU]/min via INTRAVENOUS
  Filled 2021-04-15 (×2): qty 100

## 2021-04-15 MED ORDER — ORAL CARE MOUTH RINSE
15.0000 mL | OROMUCOSAL | Status: DC
  Administered 2021-04-15 – 2021-04-16 (×7): 15 mL via OROMUCOSAL

## 2021-04-15 MED ORDER — STERILE WATER FOR IRRIGATION IR SOLN
Status: DC | PRN
  Administered 2021-04-15: 1000 mL

## 2021-04-15 MED ORDER — KETAMINE HCL 10 MG/ML IJ SOLN
INTRAMUSCULAR | Status: AC
  Filled 2021-04-15: qty 1

## 2021-04-15 MED ORDER — ONDANSETRON HCL 4 MG/2ML IJ SOLN
INTRAMUSCULAR | Status: AC
  Filled 2021-04-15: qty 2

## 2021-04-15 MED ORDER — PROPOFOL 10 MG/ML IV BOLUS
INTRAVENOUS | Status: DC | PRN
  Administered 2021-04-15: 50 mg via INTRAVENOUS

## 2021-04-15 MED ORDER — ALBUMIN HUMAN 5 % IV SOLN: INTRAVENOUS | Status: DC | PRN

## 2021-04-15 MED ORDER — PROPOFOL 500 MG/50ML IV EMUL
INTRAVENOUS | Status: DC | PRN
  Administered 2021-04-15: 20 ug/kg/min via INTRAVENOUS

## 2021-04-15 MED ORDER — LACTATED RINGERS IV SOLN: INTRAVENOUS | Status: DC

## 2021-04-15 MED ORDER — CALCIUM GLUCONATE 10 % IV SOLN
INTRAVENOUS | Status: DC | PRN
  Administered 2021-04-15: 1 g via INTRAVENOUS

## 2021-04-15 MED ORDER — FENTANYL CITRATE (PF) 250 MCG/5ML IJ SOLN
INTRAMUSCULAR | Status: AC
  Filled 2021-04-15: qty 5

## 2021-04-15 MED ORDER — LACTATED RINGERS IV SOLN: INTRAVENOUS | Status: DC | PRN

## 2021-04-15 MED ORDER — MIDAZOLAM HCL 5 MG/5ML IJ SOLN
INTRAMUSCULAR | Status: DC | PRN
  Administered 2021-04-15 (×2): 1 mg via INTRAVENOUS
  Administered 2021-04-15: 2 mg via INTRAVENOUS

## 2021-04-15 MED ORDER — ROCURONIUM BROMIDE 100 MG/10ML IV SOLN
INTRAVENOUS | Status: DC | PRN
Start: 2021-04-15 — End: 2021-04-15
  Administered 2021-04-15: 60 mg via INTRAVENOUS
  Administered 2021-04-15: 20 mg via INTRAVENOUS
  Administered 2021-04-15: 70 mg via INTRAVENOUS
  Administered 2021-04-15 (×2): 20 mg via INTRAVENOUS
  Administered 2021-04-15: 100 mg via INTRAVENOUS

## 2021-04-15 MED ORDER — CHLORHEXIDINE GLUCONATE 0.12 % MT SOLN: 15.0000 mL | Freq: Once | OROMUCOSAL | Status: DC

## 2021-04-15 MED ORDER — POTASSIUM CHLORIDE 10 MEQ/100ML IV SOLN: 10.0000 meq | INTRAVENOUS | Status: DC

## 2021-04-15 MED ORDER — MAGNESIUM SULFATE 4 GM/100ML IV SOLN
4.0000 g | Freq: Once | INTRAVENOUS | Status: AC
  Administered 2021-04-15: 4 g via INTRAVENOUS
  Filled 2021-04-15: qty 100

## 2021-04-15 MED ORDER — SODIUM BICARBONATE 8.4 % IV SOLN
INTRAVENOUS | Status: DC | PRN
  Administered 2021-04-15 (×2): 50 meq via INTRAVENOUS

## 2021-04-15 MED ORDER — POLYETHYLENE GLYCOL 3350 17 G PO PACK
17.0000 g | PACK | Freq: Every day | ORAL | Status: DC
  Filled 2021-04-15: qty 1

## 2021-04-15 MED ORDER — ROCURONIUM BROMIDE 10 MG/ML (PF) SYRINGE
PREFILLED_SYRINGE | INTRAVENOUS | Status: AC
  Filled 2021-04-15: qty 10

## 2021-04-15 MED ORDER — PHENYLEPHRINE HCL-NACL 20-0.9 MG/250ML-% IV SOLN
INTRAVENOUS | Status: DC | PRN
  Administered 2021-04-15: 60 ug/min via INTRAVENOUS

## 2021-04-15 MED ORDER — FENTANYL CITRATE PF 50 MCG/ML IJ SOSY: 25.0000 ug | PREFILLED_SYRINGE | INTRAMUSCULAR | Status: DC | PRN

## 2021-04-15 MED ORDER — ESMOLOL HCL 100 MG/10ML IV SOLN
INTRAVENOUS | Status: DC | PRN
  Administered 2021-04-15: 20 mg via INTRAVENOUS

## 2021-04-15 MED ORDER — PROPOFOL 10 MG/ML IV BOLUS
INTRAVENOUS | Status: AC
  Filled 2021-04-15: qty 20

## 2021-04-15 MED ORDER — PANTOPRAZOLE SODIUM 40 MG IV SOLR
40.0000 mg | Freq: Every day | INTRAVENOUS | Status: DC
  Administered 2021-04-15 – 2021-04-25 (×11): 40 mg via INTRAVENOUS
  Filled 2021-04-15 (×11): qty 40

## 2021-04-15 MED ORDER — BUPIVACAINE LIPOSOME 1.3 % IJ SUSP
INTRAMUSCULAR | Status: DC | PRN
  Administered 2021-04-15: 20 mL

## 2021-04-15 SURGICAL SUPPLY — 104 items
APL PRP STRL LF DISP 70% ISPRP (MISCELLANEOUS) ×1
APPLIER CLIP 5 13 M/L LIGAMAX5 (MISCELLANEOUS)
APPLIER CLIP ROT 10 11.4 M/L (STAPLE)
APR CLP MED LRG 11.4X10 (STAPLE)
APR CLP MED LRG 5 ANG JAW (MISCELLANEOUS)
BAG COUNTER SPONGE SURGICOUNT (BAG) IMPLANT
BAG SPNG CNTER NS LX DISP (BAG)
BARRIER SKIN 2 3/4 (OSTOMY) ×2 IMPLANT
BARRIER SKIN OD2.25 2 3/4 FLNG (OSTOMY) IMPLANT
BRR SKN FLT 2.75X2.25 2 PC (OSTOMY) ×1
CABLE HIGH FREQUENCY MONO STRZ (ELECTRODE) ×1 IMPLANT
CANISTER WOUND CARE 500ML ATS (WOUND CARE) ×1 IMPLANT
CELLS DAT CNTRL 66122 CELL SVR (MISCELLANEOUS) IMPLANT
CHLORAPREP W/TINT 26 (MISCELLANEOUS) ×2 IMPLANT
CLIP APPLIE 5 13 M/L LIGAMAX5 (MISCELLANEOUS) IMPLANT
CLIP APPLIE ROT 10 11.4 M/L (STAPLE) IMPLANT
COUNTER NEEDLE 20 DBL MAG RED (NEEDLE) ×1 IMPLANT
COVER MAYO STAND STRL (DRAPES) ×3 IMPLANT
COVER SURGICAL LIGHT HANDLE (MISCELLANEOUS) ×1 IMPLANT
DECANTER SPIKE VIAL GLASS SM (MISCELLANEOUS) ×2 IMPLANT
DRAIN CHANNEL 19F RND (DRAIN) ×1 IMPLANT
DRAPE LAPAROSCOPIC ABDOMINAL (DRAPES) ×1 IMPLANT
DRAPE SURG IRRIG POUCH 19X23 (DRAPES) ×2 IMPLANT
DRSG OPSITE POSTOP 4X10 (GAUZE/BANDAGES/DRESSINGS) IMPLANT
DRSG OPSITE POSTOP 4X6 (GAUZE/BANDAGES/DRESSINGS) IMPLANT
DRSG OPSITE POSTOP 4X8 (GAUZE/BANDAGES/DRESSINGS) IMPLANT
DRSG TEGADERM 2-3/8X2-3/4 SM (GAUZE/BANDAGES/DRESSINGS) ×5 IMPLANT
DRSG TEGADERM 4X4.75 (GAUZE/BANDAGES/DRESSINGS) ×1 IMPLANT
DRSG VAC ATS MED SENSATRAC (GAUZE/BANDAGES/DRESSINGS) ×1 IMPLANT
DRSG VAC ATS SM SENSATRAC (GAUZE/BANDAGES/DRESSINGS) ×1 IMPLANT
ELECT REM PT RETURN 15FT ADLT (MISCELLANEOUS) ×2 IMPLANT
ENDOLOOP SUT PDS II  0 18 (SUTURE)
ENDOLOOP SUT PDS II 0 18 (SUTURE) IMPLANT
EVACUATOR DRAINAGE 10X20 100CC (DRAIN) IMPLANT
EVACUATOR SILICONE 100CC (DRAIN) ×2 IMPLANT
GAUZE SPONGE 2X2 8PLY STRL LF (GAUZE/BANDAGES/DRESSINGS) ×1 IMPLANT
GAUZE SPONGE 4X4 12PLY STRL (GAUZE/BANDAGES/DRESSINGS) ×1 IMPLANT
GLOVE SURG NEOPR MICRO LF SZ8 (GLOVE) ×3 IMPLANT
GLOVE SURG UNDER LTX SZ8 (GLOVE) ×3 IMPLANT
GOWN STRL REUS W/TWL XL LVL3 (GOWN DISPOSABLE) ×7 IMPLANT
HANDLE SUCTION POOLE (INSTRUMENTS) IMPLANT
IRRIG SUCT STRYKERFLOW 2 WTIP (MISCELLANEOUS)
IRRIGATION SUCT STRKRFLW 2 WTP (MISCELLANEOUS) ×1 IMPLANT
KIT TURNOVER KIT A (KITS) ×1 IMPLANT
LEGGING LITHOTOMY PAIR STRL (DRAPES) IMPLANT
NDL SPNL 22GX3.5 QUINCKE BK (NEEDLE) IMPLANT
NEEDLE SPNL 22GX3.5 QUINCKE BK (NEEDLE) ×4 IMPLANT
PACK COLON (CUSTOM PROCEDURE TRAY) ×2 IMPLANT
PAD POSITIONING PINK XL (MISCELLANEOUS) ×2 IMPLANT
PENCIL SMOKE EVACUATOR (MISCELLANEOUS) IMPLANT
PROTECTOR NERVE ULNAR (MISCELLANEOUS) IMPLANT
RELOAD STAPLE 60 3.6 BLU REG (STAPLE) IMPLANT
RELOAD STAPLER BLUE 60MM (STAPLE) ×2 IMPLANT
RETRACTOR WND ALEXIS 18 MED (MISCELLANEOUS) IMPLANT
RETRACTOR WND ALEXIS 25 LRG (MISCELLANEOUS) IMPLANT
RTRCTR WOUND ALEXIS 18CM MED (MISCELLANEOUS)
RTRCTR WOUND ALEXIS 18CM SML (INSTRUMENTS) ×2
RTRCTR WOUND ALEXIS 25CM LRG (MISCELLANEOUS) ×2
SAVER CELL AAL HAEMONETICS (INSTRUMENTS) IMPLANT
SCISSORS LAP 5X35 DISP (ENDOMECHANICALS) ×2 IMPLANT
SEALER TISSUE G2 STRG ARTC 35C (ENDOMECHANICALS) ×2 IMPLANT
SET TUBE SMOKE EVAC HIGH FLOW (TUBING) ×2 IMPLANT
SLEEVE ADV FIXATION 5X100MM (TROCAR) ×3 IMPLANT
SLEEVE XCEL OPT CAN 5 100 (ENDOMECHANICALS) IMPLANT
SPONGE GAUZE 2X2 STER 10/PKG (GAUZE/BANDAGES/DRESSINGS) ×2
SPONGE T-LAP 18X18 ~~LOC~~+RFID (SPONGE) ×4 IMPLANT
STAPLE ECHEON FLEX 60 POW ENDO (STAPLE) ×1 IMPLANT
STAPLER 90 3.5 STAND SLIM (STAPLE) ×2
STAPLER 90 3.5 STD SLIM (STAPLE) IMPLANT
STAPLER PROXIMATE 75MM BLUE (STAPLE) ×1 IMPLANT
STAPLER RELOAD BLUE 60MM (STAPLE) ×4
STAPLER VISISTAT 35W (STAPLE) IMPLANT
SUCTION POOLE HANDLE (INSTRUMENTS) ×2
SURGILUBE 2OZ TUBE FLIPTOP (MISCELLANEOUS) IMPLANT
SUT ETHILON 2 0 PS N (SUTURE) ×1 IMPLANT
SUT MNCRL AB 4-0 PS2 18 (SUTURE) ×3 IMPLANT
SUT PDS AB 1 CTX 36 (SUTURE) ×2 IMPLANT
SUT PDS AB 1 TP1 96 (SUTURE) ×4 IMPLANT
SUT PROLENE 0 CT 2 (SUTURE) ×1 IMPLANT
SUT PROLENE 2 0 SH DA (SUTURE) ×1 IMPLANT
SUT SILK 2 0 (SUTURE) ×2
SUT SILK 2 0 SH CR/8 (SUTURE) ×3 IMPLANT
SUT SILK 2-0 18XBRD TIE 12 (SUTURE) ×1 IMPLANT
SUT SILK 3 0 (SUTURE)
SUT SILK 3 0 SH CR/8 (SUTURE) ×2 IMPLANT
SUT SILK 3-0 18XBRD TIE 12 (SUTURE) ×1 IMPLANT
SUT VIC AB 2-0 SH 18 (SUTURE) ×3 IMPLANT
SUT VIC AB 3-0 SH 18 (SUTURE) ×3 IMPLANT
SUT VICRYL 0 UR6 27IN ABS (SUTURE) ×1 IMPLANT
SYS LAPSCP GELPORT 120MM (MISCELLANEOUS)
SYS WOUND ALEXIS 18CM MED (MISCELLANEOUS)
SYSTEM LAPSCP GELPORT 120MM (MISCELLANEOUS) IMPLANT
SYSTEM WOUND ALEXIS 18CM MED (MISCELLANEOUS) IMPLANT
TAPE UMBILICAL 1/8 X36 TWILL (MISCELLANEOUS) ×2 IMPLANT
TOWEL OR 17X26 10 PK STRL BLUE (TOWEL DISPOSABLE) ×1 IMPLANT
TOWEL OR NON WOVEN STRL DISP B (DISPOSABLE) ×2 IMPLANT
TOWEL ~~LOC~~+RFID 17X26 BLUE (SPONGE) ×1 IMPLANT
TRAY FOLEY MTR SLVR 14FR STAT (SET/KITS/TRAYS/PACK) ×1 IMPLANT
TRAY FOLEY MTR SLVR 16FR STAT (SET/KITS/TRAYS/PACK) IMPLANT
TROCAR ADV FIXATION 5X100MM (TROCAR) ×2 IMPLANT
TROCAR BLADELESS OPT 5 100 (ENDOMECHANICALS) ×1 IMPLANT
TROCAR XCEL 12X100 BLDLESS (ENDOMECHANICALS) ×1 IMPLANT
TROCAR XCEL NON-BLD 11X100MML (ENDOMECHANICALS) IMPLANT
TUBING CONNECTING 10 (TUBING) IMPLANT

## 2021-04-15 NOTE — Consult Note (Signed)
NAME:  Mikayla Rios, MRN:  [id number], DOB:  Oct 14, 1985, LOS: 5 ADMISSION DATE:  04/10/2021, CONSULTATION DATE:  04/15/32 REFERRING MD:  Tawana Scale, CHIEF COMPLAINT:  septic shock   History of Present Illness:  Ms. Sanko is a 35 y/o woman with a history of perforated diverticulitis in May 2022, requiring 2 drains for abscesses. She had progressive weight loss, poor PO intake, and weakness with lightheadedness, prompting readmission to the hospital. She had developed an enterocolonic fistula draining stool. Ct demonstrated a large LLQ abscess behind the descending colon. She was treated with broad spetrum antibiotics and underwent laparoscopic left colectomy with end colostomy and partial small bowel resection with left retroperitoneal and flank abscess drainage. Intraoperatively she developed bradycardia and hypotension.  She was transferred to the ICU intubated and on vasopressors. Arterial line placed intra-operatively and she received 1 unit pRBCs, although there was low EBL.  Pertinent  Medical History  OSA Perforated diverticulitis May 2022 HTN Obesity Chronic sinus tachycardia Covid May 2022  Significant Hospital Events: Including procedures, antibiotic start and stop dates in addition to other pertinent events   11/4 admitted to hospital - weakness, lightheadedness> large abscess in LLQ . 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 rate 18, height 5\' 3"  (1.6 m), weight 90.7 kg, last menstrual period 04/10/2021, SpO2 100 %.        Intake/Output Summary (Last 24 hours) at 04/15/2021 1514 Last data filed at 04/15/2021 1503 Gross per 24 hour  Intake 3092.49 ml  Output 350 ml  Net 2742.49 ml   Filed Weights   04/10/21 1436  Weight: 90.7 kg     Examination: General: critically ill appearing woman lying in bed in NAD, intubated, sedated HENT: [place]/AT, eyes anicteric, pinpoint pupils, ETT in place Lungs: synchronous with MV, CTAB Cardiovascular: S1S2, tachycardic, reg rhythm Abdomen: obese, soft, midline wound vac, 2 drains with serosanguinous output. Ostomy minimally bloody. Extremities: minimal ankle edema, no cyanosis or clubbing Neuro: RASS -5, pinpoint pupuls GU: foley with light yellow urine Derm: warm, dry, some skin breakdown in inframammary folds  Abscess culture 11/4> few E coli, few actinomyces odontolyticus Blood cx> NGTD  Resolved Hospital Problem list     Assessment & Plan:   Acute respiratory failure with hypoxia post operatively H/o OSA -LTVV, 4-8cc/kg IBW with goal Pplat<30 and DP<15 -Titrate PEEP & FiO2 per ARDS protocol -PAD protocol for sedation -VAP  prevention protocol -SAT & SBT daily when appropriate -CXR, ABG today  Septic shock due to intraabdominal abscess from perforated diverticulitis, s/p multiple previous drains, now with enterocolonic fistula. Now s/p colectomy and diverting ostomy  -con't broad antimicrobials -vasopressors as required to maintain MAP >65 -monitor drain output  Chronic anemia due to critical illness -Transfuse for Hb <7 or hemodynamically significant bleeding. May need post-op transfusion if large intra-operative blood loss and shock. -con't to monitor; CBC now  Hypokalemia -recheck this afternoon; will replete PRN  Morbid obesity Protein energy malnutrition -long term modest weight loss should be a goal, but needs aggressive nutritional support currently. -question if she may need   At risk for AKI -recheck renal function -strict I/O -renally dose meds, avoid nephrotoxic meds  Best Practice (right click and "Reselect all SmartList Selections" daily)   Diet/type: NPO DVT prophylaxis: SCD GI prophylaxis: PPI  Lines: Central line, Arterial Line, and yes  and it is still needed Foley:  Yes, and it is still needed Code Status:  full code Last date of multidisciplinary goals of care discussion [ ]   Labs   CBC: Recent Labs  Lab 04/10/21 1446 04/11/21 0302 04/12/21 0525 04/13/21 0546 04/14/21 0327 04/15/21 0342  WBC 14.1* 15.4* 8.8 16.1* 17.0* 13.2*  NEUTROABS 12.2*  --   --   --   --   --   HGB 8.4* 8.4* 7.1* 8.5* 7.8* 7.5*  HCT 27.3* 26.8* 22.7* 26.0* 23.8* 23.0*  MCV 89.2 87.9 89.7 87.8 88.5 90.2  PLT 777* 696* 433* 514* 449* 407*    Basic Metabolic Panel: Recent Labs  Lab 04/10/21 1446 04/10/21 1707 04/11/21 0302 04/12/21 0525 04/13/21 0546 04/15/21 0337  NA 135  --  135 136 136 136  K 2.7*  --  4.3 3.8 3.5 3.4*  CL 97*  --  100 103 105 104  CO2 27  --  25 26 26 27   GLUCOSE 84  --  82 81 82 83  BUN 17  --  16 12 11 7   CREATININE 0.46  --  0.49 0.40* 0.45 0.43*  CALCIUM 7.5*  --  7.6* 7.3* 7.2* 7.1*  MG  --  1.9 1.8  --   --  1.3*   GFR: Estimated Creatinine Clearance: 102.9 mL/min (A) (by C-G formula based on SCr of 0.43 mg/dL (L)). Recent Labs  Lab 04/10/21 1525 04/11/21 0302 04/12/21 0525 04/12/21 1235 04/13/21 0546 04/14/21 0327 04/15/21 0342  WBC  --    < > 8.8  --  16.1* 17.0* 13.2*  LATICACIDVEN 1.5  --   --  1.5  --   --   --    < > = values in this interval not displayed.    Liver Function Tests: Recent Labs  Lab 04/10/21 1446 04/11/21 0302  AST 16 16  ALT 19 19  ALKPHOS 143* 147*  BILITOT 0.6 0.7  PROT 6.2* 6.2*  ALBUMIN 1.6* 1.7*   No results for input(s): LIPASE, AMYLASE in the last 168 hours. No results for input(s): AMMONIA in the last 168 hours.  ABG No results found for: PHART, PCO2ART, PO2ART, HCO3, TCO2, ACIDBASEDEF, O2SAT   Coagulation Profile: Recent Labs  Lab 04/11/21 0302  INR 1.2    Cardiac Enzymes: No results for input(s): CKTOTAL, CKMB, CKMBINDEX, TROPONINI in the last 168 hours.  HbA1C: Hgb A1c MFr Bld  Date/Time Value Ref Range Status  04/14/2021 03:18  PM 4.6 (L) 4.8 - 5.6 % Final    Comment:    (NOTE) Pre diabetes:          5.7%-6.4%  Diabetes:              >6.4%  Glycemic control for   <7.0% adults with diabetes   10/02/2019 11:27 AM 5.9 (H) 4.8 - 5.6 % Final    Comment:             Prediabetes: 5.7 - 6.4          Diabetes: >6.4          Glycemic control for adults with diabetes: <7.0     CBG: No results for input(s): GLUCAP in the last 168 hours.  Review of Systems:   Unable to be obtained due to mental status.  Past Medical History:  She,  has a past medical history of Allergy, Anemia, Blood infection (1985), Blood transfusion without reported diagnosis,  Diverticulitis, Hypertension, Obesity, and Sleep apnea.   Surgical History:   Past Surgical History:  Procedure Laterality Date   IR CATHETER TUBE CHANGE  12/12/2020   IR CATHETER TUBE CHANGE  01/27/2021   IR CATHETER TUBE CHANGE  02/19/2021   IR CATHETER TUBE CHANGE  04/13/2021   IR RADIOLOGIST EVAL & MGMT  11/27/2020   IR RADIOLOGIST EVAL & MGMT  12/11/2020   IR RADIOLOGIST EVAL & MGMT  01/07/2021   IR RADIOLOGIST EVAL & MGMT  02/04/2021   IR SINUS/FIST TUBE CHK-NON GI  12/12/2020   IR SINUS/FIST TUBE CHK-NON GI  02/19/2021     Social History:   reports that she has never smoked. She has never used smokeless tobacco. She reports that she does not currently use alcohol. She reports that she does not use drugs.   Family History:  Her family history includes Autism in her son; Cancer in her mother, paternal grandfather, and paternal uncle; Colon cancer in her mother; Congestive Heart Failure in her father; Diabetes in her maternal grandfather, maternal grandmother, mother, and paternal grandmother; Heart disease in her paternal aunt; Hypertension in her father, maternal grandmother, and mother; Kidney disease in her maternal grandfather.   Allergies Allergies  Allergen Reactions   Shellfish Allergy Hives   Penicillins Hives and Rash    Has patient had a PCN  reaction causing immediate rash, facial/tongue/throat swelling, SOB or lightheadedness with hypotension: No Has patient had a PCN reaction causing severe rash involving mucus membranes or skin necrosis: hives Has patient had a PCN reaction that required hospitalization No Has patient had a PCN reaction occurring within the last 10 years: yes If all of the above answers are "NO", then may proceed with Cephalosporin use.      Home Medications  Prior to Admission medications   Medication Sig Start Date End Date Taking? Authorizing Provider  acetaminophen (TYLENOL) 500 MG tablet Take 1,000 mg by mouth every 6 (six) hours as needed for moderate pain or headache.   Yes [provider]  amLODipine (NORVASC) 10 MG tablet TAKE 1 TABLET BY MOUTH EVERY DAY Patient taking differently: Take 10 mg by mouth every evening. 01/19/21  Yes [name], MD  dicyclomine (BENTYL) 10 MG capsule Take 1 capsule (10 mg total) by mouth 3 (three) times daily as needed for spasms. 03/12/21  Yes Sharyn Creamer, MD  lisinopril-hydrochlorothiazide (ZESTORETIC) 20-12.5 MG tablet TAKE 2 TABLETS BY MOUTH EVERY DAY Patient taking differently: Take 2 tablets by mouth daily. 12/10/20  Yes [name], MD  metoprolol succinate (TOPROL-XL) 25 MG 24 hr tablet TAKE 1 TABLET BY MOUTH TWICE A DAY Patient taking differently: Take 25 mg by mouth 2 (two) times daily. 04/10/21  Yes Ghumman, Milford Cage, NP  Prenatal Vit-Fe Fumarate-FA (PRENATAL MULTIVITAMIN) TABS tablet Take 1 tablet by mouth daily at 12 noon.   Yes [provider]  Simethicone (GAS-X PO) Take 1 tablet by mouth daily as needed (gas).   Yes [provider]  traMADol (ULTRAM) 50 MG tablet Take 50 mg by mouth every 6 (six) hours as needed for moderate pain. 12/29/20  Yes [provider]  benzonatate (TESSALON PERLES) 100 MG capsule Take 1 capsule (100 mg total) by mouth every 6 (six) hours as needed. Patient not taking:  Reported on 04/10/2021 10/06/20 10/06/21  Minette Brine, FNP     Critical care time: 60 min.    Julian Hy, DO 04/15/21 5:21 PM [facility]

## 2021-04-15 NOTE — Consult Note (Signed)
[facility] Nurse requested for preoperative stoma site marking  Discussed surgical procedure and stoma creation with patient and husband at the bedside.  Explained role of the [facility] nurse team.  Provided the patient with educational booklet and provided samples of pouching options.  Answered patient and husband's questions.   Examined patient lying and sitting upright in the bed.  She had very limited mobility related to pain and was unable to sit upright on the side of the bed. Placed the marking in the patient's visual field, away from any creases or abdominal contour issues and within the rectus muscle.  Pt has a large pannus. Attempted to mark below the patient's belt line, but this was not possible since a significant crease occurs lower on the abd when the patient sits upright and should be avoided if possible.   Marked for colostomy in the LLQ  __3__ cm to the left of the umbilicus and _7___AR above the umbilicus.  Marked for ileostomy in the RLQ  __3__cm to the right of the umbilicus and  __0__ cm above the umbilicus.  Patient's abdomen cleansed with CHG wipes at site markings, allowed to air dry prior to marking. Pt plans for surgery this morning.  [facility] Nurse team will follow up with patient after surgery for continued ostomy care and teaching.  Thank-you,  Julien Girt MSN, [place], [place], [place], [place]

## 2021-04-15 NOTE — Progress Notes (Signed)
Patient stabilizing in intensive care unit.  Intubated.  Weaning off pressors.  Good urine output minimal vent settings.  Discussed with Dr. March Rummage & Ms Alfredo Martinez with critical care.  I suspect was a vagal event intraoperatively and did not any major cardiopulmonary concerns so far.  They will continue to follow.  They are hoping to consider weaning trial may be as soon as tomorrow.  Covering ICU nurse aware as well

## 2021-04-15 NOTE — Interval H&P Note (Signed)
History and Physical Interval Note:  04/15/2021 10:45 AM  Mikayla Rios  has presented today for surgery, with the diagnosis of COMPLICATED DIVERTICULITIS WITH ABSCESS.  The various methods of treatment have been discussed with the patient and family. After consideration of risks, benefits and other options for treatment, the patient has consented to  Procedure(s): LAPAROSCOPIC ASSISTED HARTMANN RESECTION (N/A) as a surgical intervention.  The patient's history has been reviewed, patient examined, no change in status, stable for surgery.  I have reviewed the patient's chart and labs.  Questions were answered to the patient's satisfaction.    I have re-reviewed the the patient's records, history, medications, and allergies.  I have re-examined the patient.  I again discussed intraoperative plans and goals of post-operative recovery.  The patient agrees to proceed.  Mikayla Rios  [birth year] [id number]  Patient Care Team 37: [name], MD as PCP - General (Internal Medicine) [name], MD as PCP - Cardiology (Cardiology) [name], [name] as Physician Assistant (Emergency Medicine) Leighton Ruff, MD as Consulting Physician (General Surgery)  Patient Active Problem List   Diagnosis Date Noted   Diverticulitis of large intestine with abscess 04/10/2021   Essential hypertension 04/10/2021   SIRS (systemic inflammatory response syndrome) ([place]) 04/10/2021   Hypokalemia, inadequate intake 04/10/2021   Protein-calorie malnutrition, severe ([place]) 04/10/2021   Reactive thrombocytosis 04/10/2021   Prolonged QT interval 04/10/2021   Diverticulitis of colon with perforation 11/04/2020   Anemia 07/11/2019   Inappropriate sinus tachycardia 07/11/2019   Obstructive sleep apnea 11/27/2018   Class 3 severe obesity due to excess calories with serious comorbidity and body mass index (BMI) of 60.0 to 69.9 in adult [facility])    SVD (spontaneous vaginal delivery) 12/16/2012     Past Medical History:  Diagnosis Date   Allergy    seasonal   Anemia    Blood infection 1985   Blood transfusion without reported diagnosis    Diverticulitis    Hypertension    Obesity    Sleep apnea     Past Surgical History:  Procedure Laterality Date   IR CATHETER TUBE CHANGE  12/12/2020   IR CATHETER TUBE CHANGE  01/27/2021   IR CATHETER TUBE CHANGE  02/19/2021   IR CATHETER TUBE CHANGE  04/13/2021   IR RADIOLOGIST EVAL & MGMT  11/27/2020   IR RADIOLOGIST EVAL & MGMT  12/11/2020   IR RADIOLOGIST EVAL & MGMT  01/07/2021   IR RADIOLOGIST EVAL & MGMT  02/04/2021   IR SINUS/FIST TUBE CHK-NON GI  12/12/2020   IR SINUS/FIST TUBE CHK-NON GI  02/19/2021    Social History   Socioeconomic History   Marital status: Married    Spouse name: Insurance underwriter   Number of children: 3   Years of education: Not on file   Highest education level: Not on file  Occupational History   Not on file  Tobacco Use   Smoking status: Never   Smokeless tobacco: Never  Vaping Use   Vaping Use: Never used  Substance and Sexual Activity   Alcohol use: Not Currently   Drug use: No   Sexual activity: Yes    Partners: Male    Birth control/protection: None  Other Topics Concern   Not on file  Social History Narrative   Not on file   Social Determinants of Health   Financial Resource Strain: Not on file  Food Insecurity: Not on file  Transportation Needs: Not on file  Physical Activity: Not on file  Stress: Not  on file  Social Connections: Not on file  Intimate Partner Violence: Not on file    Family History  Problem Relation Age of Onset   Cancer Mother        breast, and in remission   Diabetes Mother    Hypertension Mother    Colon cancer Mother        dx at age 26   Congestive Heart Failure Father    Hypertension Father    Diabetes Maternal Grandmother    Hypertension Maternal Grandmother    Diabetes Maternal Grandfather    Kidney disease Maternal Grandfather    Diabetes  Paternal Grandmother    Cancer Paternal Grandfather        colon   Autism Son    Heart disease Paternal Aunt    Cancer Paternal Uncle        colon    Medications Prior to Admission  Medication Sig Dispense Refill Last Dose   acetaminophen (TYLENOL) 500 MG tablet Take 1,000 mg by mouth every 6 (six) hours as needed for moderate pain or headache.   Past Week   amLODipine (NORVASC) 10 MG tablet TAKE 1 TABLET BY MOUTH EVERY DAY (Patient taking differently: Take 10 mg by mouth every evening.) 90 tablet 3 Past Week   dicyclomine (BENTYL) 10 MG capsule Take 1 capsule (10 mg total) by mouth 3 (three) times daily as needed for spasms. 60 capsule 2 Past Week   lisinopril-hydrochlorothiazide (ZESTORETIC) 20-12.5 MG tablet TAKE 2 TABLETS BY MOUTH EVERY DAY (Patient taking differently: Take 2 tablets by mouth daily.) 180 tablet 3 Past Week   metoprolol succinate (TOPROL-XL) 25 MG 24 hr tablet TAKE 1 TABLET BY MOUTH TWICE A DAY (Patient taking differently: Take 25 mg by mouth 2 (two) times daily.) 60 tablet 0 04/09/2021 at 8 pm   Prenatal Vit-Fe Fumarate-FA (PRENATAL MULTIVITAMIN) TABS tablet Take 1 tablet by mouth daily at 12 noon.   04/07/2021   Simethicone (GAS-X PO) Take 1 tablet by mouth daily as needed (gas).   Past Week   traMADol (ULTRAM) 50 MG tablet Take 50 mg by mouth every 6 (six) hours as needed for moderate pain.   04/08/2021   benzonatate (TESSALON PERLES) 100 MG capsule Take 1 capsule (100 mg total) by mouth every 6 (six) hours as needed. (Patient not taking: Reported on 04/10/2021) 30 capsule 1 Not Taking    Current Facility-Administered Medications  Medication Dose Route Frequency Provider Last Rate Last Admin   [MAR Hold] (feeding supplement) PROSource Plus liquid 30 mL  30 mL Oral Daily Hosie Poisson, MD   30 mL at 04/14/21 2214   [MAR Hold] acetaminophen (TYLENOL) tablet 650 mg  650 mg Oral Q6H PRN Vernelle Emerald, MD   650 mg at 04/13/21 0907   Or   [MAR Hold] acetaminophen  (TYLENOL) suppository 650 mg  650 mg Rectal Q6H PRN Shalhoub, Sherryll Burger, MD       [MAR Hold] alum & mag hydroxide-simeth (MAALOX/MYLANTA) 200-200-20 MG/5ML suspension 30 mL  30 mL Oral Q6H PRN [name], MD       [MAR Hold] anidulafungin (ERAXIS) 100 mg in sodium chloride 0.9 % 100 mL IVPB  100 mg Intravenous Q24H Maczis, Michael M, PA-C       bupivacaine liposome (EXPAREL) 1.3 % injection 266 mg  20 mL Infiltration Once [name], MD       chlorhexidine (PERIDEX) 0.12 % solution 15 mL  15 mL Mouth/Throat Once Ellender, Karyl Kinnier, MD  Or   MEDLINE mouth rinse  15 mL Mouth Rinse Once Ellender, Karyl Kinnier, MD       [facility] Hold] Chlorhexidine Gluconate Cloth 2 % PADS 6 each  6 each Topical Daily Hosie Poisson, MD   6 each at 04/14/21 1016   clindamycin (CLEOCIN) IVPB 900 mg  900 mg Intravenous 60 min Pre-Op [name], MD       And   gentamicin (GARAMYCIN) 340 mg in dextrose 5 % 100 mL IVPB  5 mg/kg (Adjusted) Intravenous 60 min Pre-Op [name], MD       [facility] Hold] dicyclomine (BENTYL) capsule 10 mg  10 mg Oral TID PRN Shalhoub, Sherryll Burger, MD       [MAR Hold] diphenhydrAMINE (BENADRYL) injection 12.5-25 mg  12.5-25 mg Intravenous Q6H PRN [name], MD       [facility] Hold] enoxaparin (LOVENOX) injection 40 mg  40 mg Subcutaneous Q24H Hosie Poisson, MD   40 mg at 04/14/21 1739   [MAR Hold] feeding supplement (BOOST / RESOURCE BREEZE) liquid 1 Container  1 Container Oral BID BM Hosie Poisson, MD   1 Container at 04/14/21 1015   [MAR Hold] feeding supplement (ENSURE ENLIVE / ENSURE PLUS) liquid 237 mL  237 mL Oral Q24H Hosie Poisson, MD       [facility] Hold] folic acid (FOLVITE) tablet 1 mg  1 mg Oral Daily Hosie Poisson, MD   1 mg at 04/14/21 1015   [MAR Hold] HYDROmorphone (DILAUDID) injection 0.5-2 mg  0.5-2 mg Intravenous Q1H PRN [name], MD       [facility] Hold] lactated ringers bolus 1,000 mL  1,000 mL Intravenous Q8H PRN [name], MD       lactated ringers infusion   Intravenous  Continuous Hosie Poisson, MD 75 mL/hr at 04/15/21 0951 Continued from Pre-op at 04/15/21 0951   lactated ringers infusion   Intravenous Continuous Ellender, Karyl Kinnier, MD 10 mL/hr at 04/15/21 0957 New Bag at 04/15/21 0957   lactated ringers infusion   Intravenous Continuous Ellender, Karyl Kinnier, MD 10 mL/hr at 04/15/21 1030 New Bag at 04/15/21 1030   [MAR Hold] lip balm (CARMEX) ointment 1 application  1 application Topical BID [name], MD   1 application at 02/27/29 2220   [MAR Hold] magic mouthwash  15 mL Oral QID PRN [name], MD       magnesium sulfate IVPB 4 g 100 mL  4 g Intravenous Once Kroeger, Krista M., PA-C       [facility] Hold] melatonin tablet 10 mg  10 mg Oral QHS PRN Shalhoub, Sherryll Burger, MD       [MAR Hold] menthol-cetylpyridinium (CEPACOL) lozenge 3 mg  1 lozenge Oral PRN [name], MD       [MAR Hold] meropenem [facility]) 1 g in sodium chloride 0.9 % 100 mL IVPB  1 g Intravenous Q8H Hosie Poisson, MD 200 mL/hr at 04/15/21 0802 1 g at 04/15/21 0802   [MAR Hold] methocarbamol (ROBAXIN) 1,000 mg in dextrose 5 % 100 mL IVPB  1,000 mg Intravenous Q6H PRN [name], MD       [facility] Hold] metoprolol succinate (TOPROL-XL) 24 hr tablet 50 mg  50 mg Oral BID Hosie Poisson, MD   50 mg at 04/15/21 0808   [MAR Hold] metoprolol tartrate (LOPRESSOR) injection 2.5 mg  2.5 mg Intravenous Q8H PRN Hosie Poisson, MD       [MAR Hold] morphine 2 MG/ML injection 2 mg  2 mg Intravenous Q4H PRN Shalhoub,  Sherryll Burger, MD   2 mg at 04/15/21 0806   [MAR Hold] multivitamin with minerals tablet 1 tablet  1 tablet Oral Daily Hosie Poisson, MD   1 tablet at 04/14/21 1015   [MAR Hold] mupirocin ointment (BACTROBAN) 2 % 1 application  1 application Nasal BID [name], MD   1 application at 89/37/34 2228   [facility] Hold] oxyCODONE (Oxy IR/ROXICODONE) immediate release tablet 5-10 mg  5-10 mg Oral Q4H PRN Jillyn Ledger, PA-C   5 mg at 04/14/21 2219   [MAR Hold] phenol (CHLORASEPTIC) mouth spray 2 spray  2 spray  Mouth/Throat PRN [name], MD       potassium chloride 10 mEq in 100 mL IVPB  10 mEq Intravenous Q1 Hr x 3 Kroeger, Krista M., PA-C       [facility] Hold] prochlorperazine (COMPAZINE) injection 5-10 mg  5-10 mg Intravenous Q4H PRN [name], MD       [MAR Hold] simethicone (MYLICON) 40 KA/7.6OT suspension 80 mg  80 mg Oral QID PRN [name], MD       [facility] Hold] sodium chloride flush (NS) 0.9 % injection 10 mL  10 mL Intracatheter Q12H Soyla Dryer R, NP   10 mL at 04/14/21 2224   [MAR Hold] sodium chloride flush (NS) 0.9 % injection 10-40 mL  10-40 mL Intracatheter Q12H Hosie Poisson, MD   10 mL at 04/13/21 2056   [MAR Hold] sodium chloride flush (NS) 0.9 % injection 10-40 mL  10-40 mL Intracatheter PRN Hosie Poisson, MD         Allergies  Allergen Reactions   Shellfish Allergy Hives   Penicillins Hives and Rash    Has patient had a PCN reaction causing immediate rash, facial/tongue/throat swelling, SOB or lightheadedness with hypotension: No Has patient had a PCN reaction causing severe rash involving mucus membranes or skin necrosis: hives Has patient had a PCN reaction that required hospitalization No Has patient had a PCN reaction occurring within the last 10 years: yes If all of the above answers are "NO", then may proceed with Cephalosporin use.     BP (!) 123/96   Pulse (!) 113   Temp 99.6 F (37.6 C) (Oral)   Resp 18   Ht 5\' 3"  (1.6 m)   Wt 90.7 kg   LMP 04/10/2021 (Approximate)   SpO2 100%   BMI 35.43 kg/m   Labs: Results for orders placed or performed during the hospital encounter of 04/10/21 (from the past 48 hour(s))  CBC     Status: Abnormal   Collection Time: 04/14/21  3:27 AM  Result Value Ref Range   WBC 17.0 (H) 4.0 - 10.5 K/uL   RBC 2.69 (L) 3.87 - 5.11 MIL/uL   Hemoglobin 7.8 (L) 12.0 - 15.0 g/dL   HCT 23.8 (L) 36.0 - 46.0 %   MCV 88.5 80.0 - 100.0 fL   MCH 29.0 26.0 - 34.0 pg   MCHC 32.8 30.0 - 36.0 g/dL   RDW 22.0 (H) 11.5 - 15.5 %    Platelets 449 (H) 150 - 400 K/uL   nRBC 0.0 0.0 - 0.2 %    Comment: Performed at [facility], [place] [address], [place], [place] [address]  Hemoglobin A1c     Status: Abnormal   Collection Time: 04/14/21  3:18 PM  Result Value Ref Range   Hgb A1c MFr Bld 4.6 (L) 4.8 - 5.6 %    Comment: (NOTE) Pre diabetes:  5.7%-6.4%  Diabetes:              >6.4%  Glycemic control for   <7.0% adults with diabetes    Mean Plasma Glucose 85.32 mg/dL    Comment: Performed at [facility] [address], [place], [place] [address]  Surgical PCR screen     Status: None   Collection Time: 04/14/21  4:34 PM   Specimen: Nasal Mucosa; Nasal Swab  Result Value Ref Range   MRSA, PCR NEGATIVE NEGATIVE   Staphylococcus aureus NEGATIVE NEGATIVE    Comment: (NOTE) The Xpert SA Assay (FDA approved for NASAL specimens in patients 62 years of age and older), is one component of a comprehensive surveillance program. It is not intended to diagnose infection nor to guide or monitor treatment. Performed at [facility], [place] [address], [place], [place] [address]   Basic metabolic panel     Status: Abnormal   Collection Time: 04/15/21  3:37 AM  Result Value Ref Range   Sodium 136 135 - 145 mmol/L   Potassium 3.4 (L) 3.5 - 5.1 mmol/L   Chloride 104 98 - 111 mmol/L   CO2 27 22 - 32 mmol/L   Glucose, Bld 83 70 - 99 mg/dL    Comment: Glucose reference range applies only to samples taken after fasting for at least 8 hours.   BUN 7 6 - 20 mg/dL   Creatinine, Ser 0.43 (L) 0.44 - 1.00 mg/dL   Calcium 7.1 (L) 8.9 - 10.3 mg/dL   GFR, Estimated >60 >60 mL/min    Comment: (NOTE) Calculated using the CKD-EPI Creatinine Equation (2021)    Anion gap 5 5 - 15    Comment: Performed at [facility] [address], [place], [place] [address]  Magnesium     Status: Abnormal   Collection Time: 04/15/21  3:37 AM  Result Value Ref Range   Magnesium 1.3 (L) 1.7 - 2.4  mg/dL    Comment: Performed at [facility] [address], [place], [place] [address]  CBC     Status: Abnormal   Collection Time: 04/15/21  3:42 AM  Result Value Ref Range   WBC 13.2 (H) 4.0 - 10.5 K/uL   RBC 2.55 (L) 3.87 - 5.11 MIL/uL   Hemoglobin 7.5 (L) 12.0 - 15.0 g/dL   HCT 23.0 (L) 36.0 - 46.0 %   MCV 90.2 80.0 - 100.0 fL   MCH 29.4 26.0 - 34.0 pg   MCHC 32.6 30.0 - 36.0 g/dL   RDW 22.2 (H) 11.5 - 15.5 %   Platelets 407 (H) 150 - 400 K/uL   nRBC 0.0 0.0 - 0.2 %    Comment: Performed at [facility], [place] [address], [place], [place] [address]  Prealbumin     Status: Abnormal   Collection Time: 04/15/21  3:42 AM  Result Value Ref Range   Prealbumin 7.3 (L) 18 - 38 mg/dL    Comment: Performed at [facility] [address] [place], [place] [address]    Imaging / Studies: CT Angio Chest PE W and/or Wo Contrast  Result Date: 04/10/2021 CLINICAL DATA:  High probability PE. Being currently treated for abscess diverticulitis. Tachycardia. EXAM: CT ANGIOGRAPHY CHEST CT ABDOMEN AND PELVIS WITH CONTRAST TECHNIQUE: Multidetector CT imaging of the chest was performed using the standard protocol during bolus administration of intravenous contrast. Multiplanar CT image reconstructions and MIPs were obtained to evaluate the vascular anatomy. Multidetector  CT imaging of the abdomen and pelvis was performed using the standard protocol during bolus administration of intravenous contrast. CONTRAST:  41mL OMNIPAQUE IOHEXOL 350 MG/ML SOLN COMPARISON:  CT abdomen and pelvis 04/01/2021. CT abdomen and pelvis 11/27/2020. FINDINGS: CTA CHEST FINDINGS Cardiovascular: Satisfactory opacification of the pulmonary arteries to the segmental level. No evidence of pulmonary embolism. Normal heart size. No pericardial effusion. Mediastinum/Nodes: No enlarged mediastinal, hilar, or axillary lymph nodes. Thyroid gland, trachea, and esophagus demonstrate no significant findings.  Lungs/Pleura: Lungs are clear. No pleural effusion or pneumothorax. Musculoskeletal: No chest wall abnormality. No acute or significant osseous findings. Review of the MIP images confirms the above findings. CT ABDOMEN and PELVIS FINDINGS Hepatobiliary: There is diffuse fatty infiltration of the liver. Gallstones are present. There is no biliary ductal dilatation. Pancreas: Unremarkable. No pancreatic ductal dilatation or surrounding inflammatory changes. Spleen: Normal in size without focal abnormality. Adrenals/Urinary Tract: There is a 3.5 x 2.8 cm stable left adrenal nodule previously characterized as adenoma. Right adrenal gland is within normal limits. There is a cyst in the inferior pole the left kidney measuring 3 cm, unchanged. The kidneys otherwise appear within normal limits. The bladder is within normal limits. Stomach/Bowel: There is no evidence for bowel obstruction. There is marked focal wall thickening of the mid descending colon with surrounding inflammatory stranding. This has slightly increased when compared to the prior study. There is a small amount of extraluminal gas compatible with perforation. Gas has increased when compared to the prior examination. Stomach is within normal limits. Vascular/Lymphatic: No significant vascular findings are present. Aorta and IVC are normal in size. There are prominent and enlarged central mesenteric lymph nodes which are new from the prior examination. The largest lymph node measures 1.5 by 2.4 cm image 4/49. Reproductive: Again seen is a hypodense lesion in the fundus of the uterus containing some calcifications measuring 4.7 cm similar to the prior study. Adnexa are within normal limits. Other: Previously identified percutaneous drainage catheter in the anterior left abdomen has been pulled back in the interval. The distal catheter tip is in the deep subcutaneous soft tissues of the anterior abdominal wall. Previously identified left lateral percutaneous  drainage catheter is unchanged in position. The distal portion of the catheter is in a new lobulated enhancing fluid collection containing air abutting the inflamed portion of the colon. This collection measures 8.5 x 3.5 x 10.0 cm and is now within the adjacent iliopsoas musculature and abutting the left iliacus musculature. This fluid collection also extends through the left lateral abdominal wall on image 4/51, a new finding. There is trace free fluid in the pelvis. There is no focal abdominal wall hernia. Musculoskeletal: No acute or significant osseous findings. Review of the MIP images confirms the above findings. IMPRESSION: 1. Again seen are findings compatible with descending colon diverticulitis with perforation. Free air and inflammation have increased in the interval. 2. New lobulated enhancing fluid collection posterior to this segment of inflamed colon measuring 8.5 x 3.5 x 10.0 cm. This collection now invades the adjacent iliopsoas muscle as well as extends through the left lateral abdominal wall. The tip of the drainage catheter is in this collection. Findings are compatible with abscess. 3. Anterior left percutaneous drainage catheter tip has been pulled back and is now within the subcutaneous tissues. 4. Trace free fluid. 5. No pulmonary embolism.  No acute cardiopulmonary process. 6. Cholelithiasis. 7. Fatty infiltration of the liver. Electronically Signed   By: Ronney Asters M.D.   On: 04/10/2021  19:01   CT ABDOMEN PELVIS W CONTRAST  Result Date: 04/10/2021 CLINICAL DATA:  High probability PE. Being currently treated for abscess diverticulitis. Tachycardia. EXAM: CT ANGIOGRAPHY CHEST CT ABDOMEN AND PELVIS WITH CONTRAST TECHNIQUE: Multidetector CT imaging of the chest was performed using the standard protocol during bolus administration of intravenous contrast. Multiplanar CT image reconstructions and MIPs were obtained to evaluate the vascular anatomy. Multidetector CT imaging of the abdomen  and pelvis was performed using the standard protocol during bolus administration of intravenous contrast. CONTRAST:  63mL OMNIPAQUE IOHEXOL 350 MG/ML SOLN COMPARISON:  CT abdomen and pelvis 04/01/2021. CT abdomen and pelvis 11/27/2020. FINDINGS: CTA CHEST FINDINGS Cardiovascular: Satisfactory opacification of the pulmonary arteries to the segmental level. No evidence of pulmonary embolism. Normal heart size. No pericardial effusion. Mediastinum/Nodes: No enlarged mediastinal, hilar, or axillary lymph nodes. Thyroid gland, trachea, and esophagus demonstrate no significant findings. Lungs/Pleura: Lungs are clear. No pleural effusion or pneumothorax. Musculoskeletal: No chest wall abnormality. No acute or significant osseous findings. Review of the MIP images confirms the above findings. CT ABDOMEN and PELVIS FINDINGS Hepatobiliary: There is diffuse fatty infiltration of the liver. Gallstones are present. There is no biliary ductal dilatation. Pancreas: Unremarkable. No pancreatic ductal dilatation or surrounding inflammatory changes. Spleen: Normal in size without focal abnormality. Adrenals/Urinary Tract: There is a 3.5 x 2.8 cm stable left adrenal nodule previously characterized as adenoma. Right adrenal gland is within normal limits. There is a cyst in the inferior pole the left kidney measuring 3 cm, unchanged. The kidneys otherwise appear within normal limits. The bladder is within normal limits. Stomach/Bowel: There is no evidence for bowel obstruction. There is marked focal wall thickening of the mid descending colon with surrounding inflammatory stranding. This has slightly increased when compared to the prior study. There is a small amount of extraluminal gas compatible with perforation. Gas has increased when compared to the prior examination. Stomach is within normal limits. Vascular/Lymphatic: No significant vascular findings are present. Aorta and IVC are normal in size. There are prominent and enlarged  central mesenteric lymph nodes which are new from the prior examination. The largest lymph node measures 1.5 by 2.4 cm image 4/49. Reproductive: Again seen is a hypodense lesion in the fundus of the uterus containing some calcifications measuring 4.7 cm similar to the prior study. Adnexa are within normal limits. Other: Previously identified percutaneous drainage catheter in the anterior left abdomen has been pulled back in the interval. The distal catheter tip is in the deep subcutaneous soft tissues of the anterior abdominal wall. Previously identified left lateral percutaneous drainage catheter is unchanged in position. The distal portion of the catheter is in a new lobulated enhancing fluid collection containing air abutting the inflamed portion of the colon. This collection measures 8.5 x 3.5 x 10.0 cm and is now within the adjacent iliopsoas musculature and abutting the left iliacus musculature. This fluid collection also extends through the left lateral abdominal wall on image 4/51, a new finding. There is trace free fluid in the pelvis. There is no focal abdominal wall hernia. Musculoskeletal: No acute or significant osseous findings. Review of the MIP images confirms the above findings. IMPRESSION: 1. Again seen are findings compatible with descending colon diverticulitis with perforation. Free air and inflammation have increased in the interval. 2. New lobulated enhancing fluid collection posterior to this segment of inflamed colon measuring 8.5 x 3.5 x 10.0 cm. This collection now invades the adjacent iliopsoas muscle as well as extends through the left lateral abdominal  wall. The tip of the drainage catheter is in this collection. Findings are compatible with abscess. 3. Anterior left percutaneous drainage catheter tip has been pulled back and is now within the subcutaneous tissues. 4. Trace free fluid. 5. No pulmonary embolism.  No acute cardiopulmonary process. 6. Cholelithiasis. 7. Fatty infiltration  of the liver. Electronically Signed   By: Ronney Asters M.D.   On: 04/10/2021 19:01   CT Abdomen Pelvis W Contrast  Result Date: 04/03/2021 CLINICAL DATA:  Diverticulitis, status post drainage. EXAM: CT ABDOMEN AND PELVIS WITH CONTRAST TECHNIQUE: Multidetector CT imaging of the abdomen and pelvis was performed using the standard protocol following bolus administration of intravenous contrast. CONTRAST:  67mL OMNIPAQUE IOHEXOL 350 MG/ML SOLN COMPARISON:  CT abdomen and pelvis 02/04/2021; X-ray chest 03/31/2016. FINDINGS: Lower chest: No acute abnormality. Hepatobiliary: Hepatic steatosis. Cholelithiasis. No biliary dilatation. Pancreas: Unremarkable. No pancreatic ductal dilatation or surrounding inflammatory changes. Spleen: Normal in size without focal abnormality. Adrenals/Urinary Tract: Stable 3.7 cm left adrenal lesion. Right adrenal gland appears normal. No hydronephrosis or renal obstruction is noted. Stable left renal cyst. No renal or ureteral calculi are noted. Urinary bladder is unremarkable. Stomach/Bowel: The stomach is unremarkable. The surgical drain seen within a small bowel loop in the left lower quadrant on prior exam has been retracted and no longer appears to be within the bowel or peritoneal space. There appears to be significant enhancement and irregularity involving the descending colon with a large amount of scarring and retraction involving adjacent small bowel loops. This does not appear to be significantly changed compared to prior exam and is consistent with a history of diverticulitis. Stable position of percutaneous drainage catheter is seen with tip adjacent to the left psoas muscle. No significant fluid collection remains. Stool is noted in the more distal sigmoid colon and rectum. Vascular/Lymphatic: There appears to be some rotation of mesenteric structures in the right lower quadrant which may be new since prior exam, suggesting partial volvulus or malrotation. There is also  noted the interval development of several mesenteric lymph nodes in this area, most likely inflammatory or reactive in etiology. However, no significant bowel wall thickening or dilatation is seen in this area. Reproductive: Calcified uterine fibroid is again noted. No adnexal abnormality is noted. Other: Mild amount of free fluid is noted in the pelvis. No hernia is noted. Musculoskeletal: No acute or significant osseous findings. IMPRESSION: There appears to be significant enhancement and wall thickening involving the descending colon with some degree of traction and involvement of adjacent small bowel loops. This is consistent with the history of diverticulitis and perforation. Stable position of percutaneous drainage catheter is seen adjacent to left psoas muscle with no significant residual fluid remaining. The other percutaneous drainage catheter that was previously noted to be within small bowel loop on prior exam in the left lower quadrant, has significantly retracted and appears to be outside of the peritoneal space at this time. Since the prior exam, there does appear to be some degree of rotation involving mesenteric vessels and structures in the right lower quadrant, suggesting partial volvulus or malrotation. There is the interval development of several lymph nodes in this area, most likely inflammatory or reactive in etiology. Mild amount of free fluid is also noted in the pelvis. However, no significant bowel wall thickening or dilatation is seen in this area. These results will be called to the ordering clinician or representative by the Radiologist Assistant, and communication documented in the PACS or zVision Dashboard. Stable uterine  fibroid. Hepatic steatosis. 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 Cholelithiasis. Electronically Signed   By: Marijo Conception M.D.   On: 04/03/2021 21:53   IR Catheter Tube Change  Result Date: 04/14/2021 CLINICAL DATA:  Large intra-abscess. malfunctioning indwelling  drain. EXAM: IR DRAINAGE CATHETER EVALUATION, EXCHANGE AND UPSIZE COMPARISON:  CT Abdomen Pelvis, 04/11/2019. IR fluoroscopy, 02/19/2021. CONTRAST:  10 mL Omnipaque 300-administered via the percutaneous drainage catheter. MEDICATIONS: None. ANESTHESIA/SEDATION: Local anesthetic was administered. FLUOROSCOPY TIME:  3 minutes 33 seconds.  10 mGy. TECHNIQUE: Patient was positioned supine on the fluoroscopy table. The external portion of the existing percutaneous drainage catheter as well as the surrounding skin was prepped and draped in usual sterile fashion. A preprocedural spot fluoroscopic image was obtained of the existing percutaneous drainage catheter. A small amount of contrast was injected via the existing percutaneous drainage catheter and several fluoroscopic images were obtained in various obliquities. The external portion of the percutaneous drainage catheter was cut and cannulated with a short Amplatz wire. Under intermittent fluoroscopic guidance, the existing percutaneous drainage catheter was exchanged for a new 55 French percutaneous drainage catheter with end coiled and locked within the decompressed abscess cavity. Contrast injection confirmed appropriate position functionality of the percutaneous drainage catheter. The percutaneous drainage catheter was connected to a gravity drainage bag and secured in place within 0-silk interrupted suture and a StatLock device. A dressing was applied. The patient tolerated the procedure well without immediate postprocedural complication. FINDINGS: 1. Malfunctioning drain with large residual intra-abdominal abscess. 2. Multi compartment abscess, with patient unable to tolerate further investigation and multiple drain placement. IMPRESSION: Successful fluoroscopic-guided LEFT lower quadrant drainage catheter exchange and upsize, as above. PLAN: Continue with previous drain care, including routine flushes for catheter patency. Interventional radiology will follow  patient. Drain interrogation to be performed in 2 weeks, or if earlier suspected catheter malfunction. Michaelle Birks, MD [facility] Radiology Electronically Signed   By: Michaelle Birks M.D.   On: 04/14/2021 10:52   DG CHEST PORT 1 VIEW  Result Date: 04/12/2021 CLINICAL DATA:  PICC placement EXAM: PORTABLE CHEST 1 VIEW COMPARISON:  04/10/2021 FINDINGS: Right arm PICC tip in the proximal SVC. Lungs remain clear without infiltrate or effusion. IMPRESSION: PICC tip in the proximal SVC.  Lungs are clear Electronically Signed   By: Franchot Gallo M.D.   On: 04/12/2021 19:55   DG Chest Portable 1 View  Result Date: 04/10/2021 CLINICAL DATA:  Weakness. EXAM: PORTABLE CHEST 1 VIEW COMPARISON:  March 31, 2016 FINDINGS: The heart size and mediastinal contours are within normal limits. Both lungs are clear. The visualized skeletal structures are unremarkable. IMPRESSION: No active disease. Electronically Signed   By: Virgina Norfolk M.D.   On: 04/10/2021 15:51   ECHOCARDIOGRAM COMPLETE  Result Date: 04/14/2021    ECHOCARDIOGRAM REPORT   Patient Name:   Mikayla Rios Date of Exam: 04/14/2021 Medical Rec #:  [id number]      Height:       63.0 in Accession #:    [id number]     Weight:       200.0 lb Date of Birth:  10-29-1985       BSA:          1.934 m Patient Age:    35 years       BP:           109/82 mmHg Patient Gender: F  HR:           133 bpm. Exam Location:  Inpatient Procedure: 2D Echo, Cardiac Doppler and Color Doppler Indications:    I47.2 Ventricular tachycardia  History:        Patient has prior history of Echocardiogram examinations, most                 recent 11/09/2019. Arrythmias:Prolonged QT interval; Risk                 Factors:Hypertension.  Sonographer:    Glo Herring Referring Phys: Theola Sequin IMPRESSIONS  1. Left ventricular ejection fraction, by estimation, is 60 to 65%. The left ventricle has normal function. The left  ventricle has no regional wall motion abnormalities. Indeterminate diastolic filling due to E-A fusion.  2. Right ventricular systolic function is normal. The right ventricular size is normal.  3. The mitral valve is normal in structure. No evidence of mitral valve regurgitation. No evidence of mitral stenosis.  4. The aortic valve is normal in structure. Aortic valve regurgitation is not visualized. No aortic stenosis is present.  5. The inferior vena cava is normal in size with greater than 50% respiratory variability, suggesting right atrial pressure of 3 mmHg. FINDINGS  Left Ventricle: Left ventricular ejection fraction, by estimation, is 60 to 65%. The left ventricle has normal function. The left ventricle has no regional wall motion abnormalities. The left ventricular internal cavity size was normal in size. There is  no left ventricular hypertrophy. Indeterminate diastolic filling due to E-A fusion. Right Ventricle: The right ventricular size is normal. No increase in right ventricular wall thickness. Right ventricular systolic function is normal. Left Atrium: Left atrial size was normal in size. Right Atrium: Right atrial size was normal in size. Pericardium: There is no evidence of pericardial effusion. Mitral Valve: The mitral valve is normal in structure. No evidence of mitral valve regurgitation. No evidence of mitral valve stenosis. Tricuspid Valve: The tricuspid valve is normal in structure. Tricuspid valve regurgitation is not demonstrated. No evidence of tricuspid stenosis. Aortic Valve: The aortic valve is normal in structure. Aortic valve regurgitation is not visualized. No aortic stenosis is present. Aortic valve mean gradient measures 4.0 mmHg. Aortic valve peak gradient measures 8.6 mmHg. Pulmonic Valve: The pulmonic valve was normal in structure. Pulmonic valve regurgitation is not visualized. No evidence of pulmonic stenosis. Aorta: The aortic root is normal in size and structure. Venous: The  inferior vena cava is normal in size with greater than 50% respiratory variability, suggesting right atrial pressure of 3 mmHg. IAS/Shunts: No atrial level shunt detected by color flow Doppler.  LEFT VENTRICLE PLAX 2D LVIDd:         3.80 cm LVIDs:         2.30 cm LV PW:         1.00 cm LV IVS:        1.00 cm  RIGHT VENTRICLE RV S prime:     21.90 cm/s LEFT ATRIUM             Index LA diam:        3.50 cm 1.81 cm/m LA Vol (A2C):   32.1 ml 16.60 ml/m LA Vol (A4C):   29.5 ml 15.26 ml/m LA Biplane Vol: 32.3 ml 16.70 ml/m  AORTIC VALVE                   PULMONIC VALVE AV Vmax:  147.00 cm/s PV Vmax:       1.21 m/s AV Vmean:          96.600 cm/s PV Peak grad:  5.9 mmHg AV VTI:            0.168 m AV Peak Grad:      8.6 mmHg AV Mean Grad:      4.0 mmHg LVOT Vmax:         85.50 cm/s LVOT Vmean:        53.100 cm/s LVOT VTI:          0.097 m LVOT/AV VTI ratio: 0.58  AORTA Ao Root diam: 3.00 cm Ao Asc diam:  2.90 cm  SHUNTS Systemic VTI: 0.10 m Dani Gobble Croitoru MD Electronically signed by Sanda Klein MD Signature Date/Time: 04/14/2021/1:18:01 PM    Final    [place] EKG SITE RITE  Result Date: 04/12/2021 If Site Rite image not attached, placement could not be confirmed due to current cardiac rhythm.    Adin Hector, M.D., F.A.C.S. Gastrointestinal [facility]ally [facility] [address], [place], [place] [address] [phone] Main / Paging  04/15/2021 10:45 AM    Adin Hector

## 2021-04-15 NOTE — Anesthesia Procedure Notes (Signed)
Arterial Line Insertion Start/End11/02/2021 2:40 PM, 04/15/2021 2:50 PM Performed by: Murvin Natal, MD, anesthesiologist  Patient location: OR. Preanesthetic checklist: patient identified, IV checked, site marked, risks and benefits discussed, surgical consent, monitors and equipment checked, pre-op evaluation, timeout performed and anesthesia consent Patient sedated Left, radial was placed Catheter size: 20 G Hand hygiene performed , maximum sterile barriers used  and Seldinger technique used  Attempts: 1 Procedure performed without using ultrasound guided (U/S assisted. No image due to emergent placement) technique. Following insertion, dressing applied and Biopatch. Post procedure assessment: normal and unchanged  Patient tolerated the procedure well with no immediate complications.

## 2021-04-15 NOTE — Progress Notes (Signed)
PT Cancellation Note  Patient Details Name: Mikayla Rios MRN: [id number] DOB: 02-Apr-1986   Cancelled Treatment:    Reason Eval/Treat Not Completed: Patient at procedure or test/unavailable--surgery today. Will check back another day.    [facility]  Office: [phone] Pager: [phone]

## 2021-04-15 NOTE — Transfer of Care (Signed)
Immediate Anesthesia Transfer of Care Note  Patient: Mikayla Rios  Procedure(s) Performed: LAPAROSCOPIC ASSISTED HARTMANN RESECTION (Abdomen) COLOSTOMY CREATION/HARTMANN PROCEDURE (Abdomen)  Patient Location: PACU and ICU  Anesthesia Type:General  Level of Consciousness: sedated, unresponsive and Patient remains intubated per anesthesia plan  Airway & Oxygen Therapy: Patient remains intubated per anesthesia plan and Patient placed on Ventilator (see vital sign flow sheet for setting)  Post-op Assessment: Report given to RN and Post -op Vital signs reviewed and stable  Post vital signs: Reviewed and stable  Last Vitals:  Vitals Value Taken Time  BP    Temp    Pulse    Resp    SpO2      Last Pain:  Vitals:   04/15/21 0915  TempSrc: Oral  PainSc:       Patients Stated Pain Goal: 3 (95/18/84 1660)  Complications: No notable events documented.

## 2021-04-15 NOTE — Progress Notes (Addendum)
PROGRESS NOTE    Mikayla Rios  YWV:[id number] DOB: 12/12/1985 DOA: 04/10/2021 PCP: [name], MD    Chief Complaint  Patient presents with   Weakness   Tachycardia    Brief Narrative:   35 year old female with past medical history of hypertension, inappropriate sinus tachycardia, obesity, obstructive sleep apnea and diverticulosis with complicated diverticulitis and abscess formation 11/2020 status post IR guided placement of 2 left lower quadrant drains who presents to [facility] emergency department with complaints of generalized weakness and lightheadedness. She was found to have new abscess measuring about 8.5 times 10 cm extending into the left lateral abd wall. She underwent drain exchange/upsize on Monday by IR. She is scheduled for OR tomorrow for colectomy and colostomy by Dr Johney Maine.   Assessment & Plan:   Principal Problem:   Diverticulitis of large intestine with abscess Active Problems:   Class 3 severe obesity due to excess calories with serious comorbidity and body mass index (BMI) of 60.0 to 69.9 in adult [facility])   Obstructive sleep apnea   Anemia   Inappropriate sinus tachycardia   Essential hypertension   SIRS (systemic inflammatory response syndrome) (HCC)   Hypokalemia, inadequate intake   Protein-calorie malnutrition, severe (HCC)   Reactive thrombocytosis   Prolonged QT interval   Complicated diverticulitis with abscess s/p drain placement by IR twice in June 2022 with numerous exchanges since then due to persistent fistulous -CT of the abdomen and pelvis New lobulated enhancing fluid collection posterior to this segment of inflamed colon measuring 8.5 x 3.5 x 10.0 cm. This collection now invades the adjacent iliopsoas muscle as well as extends through the left lateral abdominal wall. -Patient has had prolonged treatment with antibiotics and multiple drain placements, but despite conservative measures, she has failed to improve and now has a new  abscess -Seen by general surgery and she underwent colectomy/colostomy and drainage of retroperitoneal abscess -Currently on IV meropenem and anidulafungin per surgery -Currently with NG tube in place -She has wound VAC over abdominal wound -Defer further postop care to surgery  Septic shock -Secondary to retroperitoneal abscess -Patient required vasopressor therapy postoperatively and is currently being monitored in the ICU -She is on broad-spectrum antibiotics -We will repeat blood cultures today -CCM also following  Acute respiratory failure with hypoxia requiring mechanical ventilation -Patient was intubated for operative management and due to hemodynamic instability postoperatively, decision was made to leave the patient intubated and monitored in the ICU -Chest x-ray shows elevated right hemidiaphragm -Postop ABG on mechanical ventilation shows normal pH/PCO2 -She does have a history of sleep apnea -Defer further vent management to critical care  Possible left colon lesion -Noted on CT scan from 02/04/2021 -She had been referred to GI as an outpatient for colonoscopy -This can be pursued on an elective basis once her acute issues have resolved  Pyuria Urine cultures ordered, growing e coli, -She is on appropriate antibiotics    Mechanical Fall:  Patient reports dizziness and an episode of syncope and an episode of mechanical fall possibly from orthostatic hypotension.  Monitor on telemetry. CT angiogram of the chest ruled out PE EKG shows prolonged QT interval 636.  Nonspecific T wave inversions.troponin is negative. Patient denies any chest pain or shortness of breath. Probably secondary to orthostatic hypotension from infection. Overall QTC has improved   Hypokalemia Replaced,.   Prolonged QTc: - from hypokalemia and hypomagnesemia. - K is 4.3. magnesium is 1.8. Keep k >4 and mag>2.   Repeat EKG shows improvement  in the qtC.    Anemia of chronic disease/  Acute anemia of acute illness - normocytic.  - anemia panel  Shows low folate levels.  -She was transfused 1 unit preoperatively -During surgery, she was transfused another 2 units PRBC -Anticipate some degree of blood loss during surgery -Continue to follow CBC Transfuse to keep hemoglobin greater than 7.    Thrombocytosis:  Possibly from the infection.    Hypoalbuminemia:  - delaying the infection.  - will get dietary consult.    Sinus tachycardia she was previously on metoprolol, will hold for now since she is hypotensive Probably from anemia and infection and pain.  Pt not symptomatic.  Echocardiogram unremarkable    Hypertension:  Antihypertensives currently on hold since she is hypotensive on pressors  Leukocytosis:  Related to infectious process   DVT prophylaxis: Lovenox Code Status: Full code Family Communication: Discussed with husband at the bedside Disposition:   Status is: Inpatient  Remains inpatient appropriate because: IV ANTIBIOTICS       Consultants:  Surgery IR Gastroenterology Cardiology PCCM  Procedures: Laparoscopic left colectomy/end colostomy, small bowel resection, left retroperitoneal/flank abscess drainage  Antimicrobials:  Antibiotics Given (last 72 hours)     Date/Time Action Medication Dose Rate   04/12/21 2326 New Bag/Given   cefTRIAXone (ROCEPHIN) 2 g in sodium chloride 0.9 % 100 mL IVPB 2 g 200 mL/hr   04/13/21 0012 New Bag/Given   metroNIDAZOLE (FLAGYL) IVPB 500 mg 500 mg 100 mL/hr   04/13/21 1121 New Bag/Given   meropenem (MERREM) 1 g in sodium chloride 0.9 % 100 mL IVPB 1 g 200 mL/hr   04/13/21 1723 New Bag/Given   meropenem (MERREM) 1 g in sodium chloride 0.9 % 100 mL IVPB 1 g 200 mL/hr   04/14/21 0101 New Bag/Given   meropenem (MERREM) 1 g in sodium chloride 0.9 % 100 mL IVPB 1 g 200 mL/hr   04/14/21 1013 New Bag/Given   meropenem (MERREM) 1 g in sodium chloride 0.9 % 100 mL IVPB 1 g 200 mL/hr   04/14/21 1633  New Bag/Given   meropenem (MERREM) 1 g in sodium chloride 0.9 % 100 mL IVPB 1 g 200 mL/hr   04/15/21 0104 New Bag/Given   meropenem (MERREM) 1 g in sodium chloride 0.9 % 100 mL IVPB 1 g 200 mL/hr   04/15/21 0802 New Bag/Given   meropenem (MERREM) 1 g in sodium chloride 0.9 % 100 mL IVPB 1 g 200 mL/hr   04/15/21 1122 Given   clindamycin (CLEOCIN) IVPB 900 mg 900 mg    04/15/21 1122 New Bag/Given   gentamicin (GARAMYCIN) 340 mg in dextrose 5 % 100 mL IVPB 340 mg          Subjective: Seen in her room postoperatively, sedated on ventilator  Objective: Vitals:   04/15/21 0644 04/15/21 0808 04/15/21 0915 04/15/21 1644  BP: (!) 142/62 115/82 (!) 123/96 106/74  Pulse: 78 (!) 105 (!) 113 (!) 106  Resp:    16  Temp: 98.3 F (36.8 C)  99.6 F (37.6 C)   TempSrc: Oral  Oral   SpO2: 95%  100% 100%  Weight:  123.6 kg    Height:        Intake/Output Summary (Last 24 hours) at 04/15/2021 1832 Last data filed at 04/15/2021 1646 Gross per 24 hour  Intake 5086.55 ml  Output 775 ml  Net 4311.55 ml   Filed Weights   04/10/21 1436 04/15/21 0808  Weight: 90.7 kg 123.6 kg  Examination:  General exam: Sedated on vent Respiratory system: Clear to auscultation. Respiratory effort normal. Cardiovascular system:RRR. No murmurs, rubs, gallops. Gastrointestinal system: Abdomen is nondistended, soft and nontender. No organomegaly or masses felt. Normal bowel sounds heard.  Ostomy in place, wound VAC in place Central nervous system: No focal neurological deficits. Extremities: No C/C/E, +pedal pulses Skin: No rashes, lesions or ulcers Psychiatry: Unable to assess        Data Reviewed: I have personally reviewed following labs and imaging studies  CBC: Recent Labs  Lab 04/10/21 1446 04/11/21 0302 04/12/21 0525 04/13/21 0546 04/14/21 0327 04/15/21 0342 04/15/21 1456  WBC 14.1* 15.4* 8.8 16.1* 17.0* 13.2*  --   NEUTROABS 12.2*  --   --   --   --   --   --   HGB 8.4* 8.4* 7.1*  8.5* 7.8* 7.5* 6.8*  HCT 27.3* 26.8* 22.7* 26.0* 23.8* 23.0* 20.0*  MCV 89.2 87.9 89.7 87.8 88.5 90.2  --   PLT 777* 696* 433* 514* 449* 407*  --     Basic Metabolic Panel: Recent Labs  Lab 04/10/21 1446 04/10/21 1707 04/11/21 0302 04/12/21 0525 04/13/21 0546 04/15/21 0337 04/15/21 1456  NA 135  --  135 136 136 136 141  K 2.7*  --  4.3 3.8 3.5 3.4* 3.7  CL 97*  --  100 103 105 104  --   CO2 27  --  25 26 26 27   --   GLUCOSE 84  --  82 81 82 83  --   BUN 17  --  16 12 11 7   --   CREATININE 0.46  --  0.49 0.40* 0.45 0.43*  --   CALCIUM 7.5*  --  7.6* 7.3* 7.2* 7.1*  --   MG  --  1.9 1.8  --   --  1.3*  --     GFR: Estimated Creatinine Clearance: 123 mL/min (A) (by C-G formula based on SCr of 0.43 mg/dL (L)).  Liver Function Tests: Recent Labs  Lab 04/10/21 1446 04/11/21 0302  AST 16 16  ALT 19 19  ALKPHOS 143* 147*  BILITOT 0.6 0.7  PROT 6.2* 6.2*  ALBUMIN 1.6* 1.7*    CBG: No results for input(s): GLUCAP in the last 168 hours.   Recent Results (from the past 240 hour(s))  Resp Panel by RT-PCR (Flu A&B, Covid) Nasopharyngeal Swab     Status: None   Collection Time: 04/10/21  2:46 PM   Specimen: Nasopharyngeal Swab; Nasopharyngeal(NP) swabs in vial transport medium  Result Value Ref Range Status   SARS Coronavirus 2 by RT PCR NEGATIVE NEGATIVE Final    Comment: (NOTE) SARS-CoV-2 target nucleic acids are NOT DETECTED.  The SARS-CoV-2 RNA is generally detectable in upper respiratory specimens during the acute phase of infection. The lowest concentration of SARS-CoV-2 viral copies this assay can detect is 138 copies/mL. A negative result does not preclude SARS-Cov-2 infection and should not be used as the sole basis for treatment or other patient management decisions. A negative result may occur with  improper specimen collection/handling, submission of specimen other than nasopharyngeal swab, presence of viral mutation(s) within the areas targeted by this  assay, and inadequate number of viral copies(<138 copies/mL). A negative result must be combined with clinical observations, patient history, and epidemiological information. The expected result is Negative.  Fact Sheet for Patients:  EntrepreneurPulse.com.au  Fact Sheet for Healthcare Providers:  IncredibleEmployment.be  This test is no t yet approved or cleared by the  [place] Architectural technologist and  has been authorized for detection and/or diagnosis of SARS-CoV-2 by FDA under an Print production planner (EUA). This EUA will remain  in effect (meaning this test can be used) for the duration of the COVID-19 declaration under Section 564(b)(1) of the Act, 21 U.S.C.section 360bbb-3(b)(1), unless the authorization is terminated  or revoked sooner.       Influenza A by PCR NEGATIVE NEGATIVE Final   Influenza B by PCR NEGATIVE NEGATIVE Final    Comment: (NOTE) The Xpert Xpress SARS-CoV-2/FLU/RSV plus assay is intended as an aid in the diagnosis of influenza from Nasopharyngeal swab specimens and should not be used as a sole basis for treatment. Nasal washings and aspirates are unacceptable for Xpert Xpress SARS-CoV-2/FLU/RSV testing.  Fact Sheet for Patients: EntrepreneurPulse.com.au  Fact Sheet for Healthcare Providers: IncredibleEmployment.be  This test is not yet approved or cleared by the [place] FDA and has been authorized for detection and/or diagnosis of SARS-CoV-2 by FDA under an Emergency Use Authorization (EUA). This EUA will remain in effect (meaning this test can be used) for the duration of the COVID-19 declaration under Section 564(b)(1) of the Act, 21 U.S.C. section 360bbb-3(b)(1), unless the authorization is terminated or revoked.  Performed at [facility], [place] [address] [place], [place] [address]   Blood culture (routine x 2)     Status: None   Collection Time:  04/10/21  4:42 PM   Specimen: BLOOD  Result Value Ref Range Status   Specimen Description   Final    BLOOD RIGHT ANTECUBITAL Performed at [facility] [address], [place], [place] [address]    Special Requests   Final    BOTTLES DRAWN AEROBIC AND ANAEROBIC Blood Culture adequate volume Performed at [place] [address], [place], [place] [address]    Culture   Final    NO GROWTH 5 DAYS Performed at [facility], [place] [address], [place], [place] [address]    Report Status 04/15/2021 FINAL  Final  Aerobic/Anaerobic Culture w Gram Stain (surgical/deep wound)     Status: None   Collection Time: 04/10/21  7:56 PM   Specimen: Abscess  Result Value Ref Range Status   Specimen Description   Final    ABSCESS Performed at [facility] [address] [place], [place] [address]    Special Requests   Final    NONE Performed at [facility], [place] [address], [place], [place] [address]    Gram Stain   Final    NO SQUAMOUS EPITHELIAL CELLS SEEN FEW WBC SEEN FEW GRAM POSITIVE COCCI    Culture   Final    FEW ESCHERICHIA COLI FEW ACTINOMYCES ODONTOLYTICUS Standardized susceptibility testing for this organism is not available. MODERATE BACTEROIDES OVATUS BETA LACTAMASE POSITIVE Performed at [facility], [place] [address] [place], [place] [address]    Report Status 04/13/2021 FINAL  Final   Organism ID, Bacteria ESCHERICHIA COLI  Final      Susceptibility   Escherichia coli - MIC*    AMPICILLIN >=32 RESISTANT Resistant     CEFAZOLIN 8 SENSITIVE Sensitive     CEFEPIME <=0.12 SENSITIVE Sensitive     CEFTAZIDIME <=1 SENSITIVE Sensitive     CEFTRIAXONE <=0.25 SENSITIVE Sensitive     CIPROFLOXACIN >=4 RESISTANT Resistant     GENTAMICIN <=1 SENSITIVE Sensitive     IMIPENEM <=0.25 SENSITIVE Sensitive     TRIMETH/SULFA <=20 SENSITIVE Sensitive  AMPICILLIN/SULBACTAM >=32 RESISTANT Resistant      PIP/TAZO <=4 SENSITIVE Sensitive     * FEW ESCHERICHIA COLI  Urine Culture     Status: Abnormal   Collection Time: 04/11/21  1:09 PM   Specimen: Urine, Clean Catch  Result Value Ref Range Status   Specimen Description   Final    URINE, CLEAN CATCH Performed at [facility], [place] [address], [place], [place] [address]    Special Requests   Final    NONE Performed at [facility], [place] [address], [place], [place] [address]    Culture 70,000 COLONIES/mL ESCHERICHIA COLI (A)  Final   Report Status 04/13/2021 FINAL  Final   Organism ID, Bacteria ESCHERICHIA COLI (A)  Final      Susceptibility   Escherichia coli - MIC*    AMPICILLIN >=32 RESISTANT Resistant     CEFAZOLIN 8 SENSITIVE Sensitive     CEFEPIME <=0.12 SENSITIVE Sensitive     CEFTRIAXONE <=0.25 SENSITIVE Sensitive     CIPROFLOXACIN >=4 RESISTANT Resistant     GENTAMICIN <=1 SENSITIVE Sensitive     IMIPENEM <=0.25 SENSITIVE Sensitive     NITROFURANTOIN <=16 SENSITIVE Sensitive     TRIMETH/SULFA <=20 SENSITIVE Sensitive     AMPICILLIN/SULBACTAM >=32 RESISTANT Resistant     PIP/TAZO <=4 SENSITIVE Sensitive     * 70,000 COLONIES/mL ESCHERICHIA COLI  Surgical PCR screen     Status: None   Collection Time: 04/14/21  4:34 PM   Specimen: Nasal Mucosa; Nasal Swab  Result Value Ref Range Status   MRSA, PCR NEGATIVE NEGATIVE Final   Staphylococcus aureus NEGATIVE NEGATIVE Final    Comment: (NOTE) The Xpert SA Assay (FDA approved for NASAL specimens in patients 88 years of age and older), is one component of a comprehensive surveillance program. It is not intended to diagnose infection nor to guide or monitor treatment. Performed at [facility], [place] [address] [place], [place] [address]           Radiology Studies: DG Abd 1 View  Result Date: 04/15/2021 CLINICAL DATA:  Incorrect needle count. EXAM: ABDOMEN - 1 VIEW COMPARISON:  None. FINDINGS: There is an  extensive amount of overlying tubing and suction bulb which may obscure a small needle. Large amount of soft tissue gas is noted. This may also obscure needle. No definite evidence of surgical needle is noted. IMPRESSION: Exam is significantly limited due to overlying tubing and suction bulb as well as a large amount of soft tissue gas. No definite radiopaque needle is noted, although this may be obscured due to overlying artifact as described above. These results were called by telephone at the time of interpretation on 04/15/2021 at 4:10 pm to provider Dr. Johney Maine, who verbally acknowledged these results. Electronically Signed   By: Marijo Conception M.D.   On: 04/15/2021 16:11   ECHOCARDIOGRAM COMPLETE  Result Date: 04/14/2021    ECHOCARDIOGRAM REPORT   Patient Name:   Mikayla Rios Date of Exam: 04/14/2021 Medical Rec #:  [id number]      Height:       63.0 in Accession #:    [id number]     Weight:       200.0 lb Date of Birth:  [birth year]       BSA:          1.934 m Patient Age:    35 years       BP:  109/82 mmHg Patient Gender: F              HR:           133 bpm. Exam Location:  Inpatient Procedure: 2D Echo, Cardiac Doppler and Color Doppler Indications:    I47.2 Ventricular tachycardia  History:        Patient has prior history of Echocardiogram examinations, most                 recent 11/09/2019. Arrythmias:Prolonged QT interval; Risk                 Factors:Hypertension.  Sonographer:    Glo Herring Referring Phys: Theola Sequin IMPRESSIONS  1. Left ventricular ejection fraction, by estimation, is 60 to 65%. The left ventricle has normal function. The left ventricle has no regional wall motion abnormalities. Indeterminate diastolic filling due to E-A fusion.  2. Right ventricular systolic function is normal. The right ventricular size is normal.  3. The mitral valve is normal in structure. No evidence of mitral valve regurgitation. No evidence of mitral stenosis.  4. The aortic valve is normal  in structure. Aortic valve regurgitation is not visualized. No aortic stenosis is present.  5. The inferior vena cava is normal in size with greater than 50% respiratory variability, suggesting right atrial pressure of 3 mmHg. FINDINGS  Left Ventricle: Left ventricular ejection fraction, by estimation, is 60 to 65%. The left ventricle has normal function. The left ventricle has no regional wall motion abnormalities. The left ventricular internal cavity size was normal in size. There is  no left ventricular hypertrophy. Indeterminate diastolic filling due to E-A fusion. Right Ventricle: The right ventricular size is normal. No increase in right ventricular wall thickness. Right ventricular systolic function is normal. Left Atrium: Left atrial size was normal in size. Right Atrium: Right atrial size was normal in size. Pericardium: There is no evidence of pericardial effusion. Mitral Valve: The mitral valve is normal in structure. No evidence of mitral valve regurgitation. No evidence of mitral valve stenosis. Tricuspid Valve: The tricuspid valve is normal in structure. Tricuspid valve regurgitation is not demonstrated. No evidence of tricuspid stenosis. Aortic Valve: The aortic valve is normal in structure. Aortic valve regurgitation is not visualized. No aortic stenosis is present. Aortic valve mean gradient measures 4.0 mmHg. Aortic valve peak gradient measures 8.6 mmHg. Pulmonic Valve: The pulmonic valve was normal in structure. Pulmonic valve regurgitation is not visualized. No evidence of pulmonic stenosis. Aorta: The aortic root is normal in size and structure. Venous: The inferior vena cava is normal in size with greater than 50% respiratory variability, suggesting right atrial pressure of 3 mmHg. IAS/Shunts: No atrial level shunt detected by color flow Doppler.  LEFT VENTRICLE PLAX 2D LVIDd:         3.80 cm LVIDs:         2.30 cm LV PW:         1.00 cm LV IVS:        1.00 cm  RIGHT VENTRICLE RV S prime:      21.90 cm/s LEFT ATRIUM             Index LA diam:        3.50 cm 1.81 cm/m LA Vol (A2C):   32.1 ml 16.60 ml/m LA Vol (A4C):   29.5 ml 15.26 ml/m LA Biplane Vol: 32.3 ml 16.70 ml/m  AORTIC VALVE  PULMONIC VALVE AV Vmax:           147.00 cm/s PV Vmax:       1.21 m/s AV Vmean:          96.600 cm/s PV Peak grad:  5.9 mmHg AV VTI:            0.168 m AV Peak Grad:      8.6 mmHg AV Mean Grad:      4.0 mmHg LVOT Vmax:         85.50 cm/s LVOT Vmean:        53.100 cm/s LVOT VTI:          0.097 m LVOT/AV VTI ratio: 0.58  AORTA Ao Root diam: 3.00 cm Ao Asc diam:  2.90 cm  SHUNTS Systemic VTI: 0.10 m Dani Gobble Croitoru MD Electronically signed by Sanda Klein MD Signature Date/Time: 04/14/2021/1:18:01 PM    Final         Scheduled Meds:  Chlorhexidine Gluconate Cloth  6 each Topical Daily   enoxaparin (LOVENOX) injection  40 mg Subcutaneous Q24H   fentaNYL (SUBLIMAZE) injection  50 mcg Intravenous Once   lip balm  1 application Topical BID   mupirocin ointment  1 application Nasal BID   pantoprazole (PROTONIX) IV  40 mg Intravenous Daily   polyethylene glycol  17 g Per Tube Daily   Continuous Infusions:  sodium chloride     anidulafungin     fentaNYL infusion INTRAVENOUS     lactated ringers     lactated ringers 75 mL/hr at 04/15/21 1827   magnesium sulfate bolus IVPB     meropenem (MERREM) IV Stopped (04/15/21 0834)   methocarbamol (ROBAXIN) IV     norepinephrine (LEVOPHED) Adult infusion 10 mcg/min (04/15/21 1700)   propofol (DIPRIVAN) infusion 20 mcg/kg/min (04/15/21 1830)   vasopressin       LOS: 5 days    Critical care procedure note Authorized and performed by: Kathie Dike Total critical care time: Approximately 35 minutes Due to high probability of clinically significant, life-threatening deterioration, the patient required my highest level of preparedness to intervene emergently and I personally spent this critical care time directly and personally managing the  patient.  The critical care time included obtaining a history, examining the patient, pulse oximetry, ordering and review of studies, arranging urgent treatment with development of a management plan, evaluation of patient's response to treatment, frequent reassessment, discussions with other providers.  Critical care time was performed to assess and manage the high probability of imminent, life-threatening deterioration that could result in multiorgan failure.  It was exclusive of separate billable procedures and treating other patients and teaching time.  Please see MDM section and the rest of the of note for further information on patient assessment and treatment     Kathie Dike, MD [facility]   To contact the attending provider between 7A-7P or the covering provider during after hours 7P-7A, please log into the web site [email or web address] and access using universal [place] password for that web site. If you do not have the password, please call the hospital operator.  04/15/2021, 6:32 PM

## 2021-04-15 NOTE — Anesthesia Procedure Notes (Signed)
Procedure Name: Intubation Date/Time: 04/15/2021 11:46 AM Performed by: Garrel Ridgel, CRNA Pre-anesthesia Checklist: Patient identified, Emergency Drugs available, Suction available and Patient being monitored Patient Re-evaluated:Patient Re-evaluated prior to induction Oxygen Delivery Method: Circle system utilized Preoxygenation: Pre-oxygenation with 100% oxygen Induction Type: IV induction Ventilation: Mask ventilation without difficulty Laryngoscope Size: Mac and 3 Grade View: Grade II Tube type: Oral Tube size: 7.0 mm Number of attempts: 1 Airway Equipment and Method: Stylet and Oral airway Placement Confirmation: ETT inserted through vocal cords under direct vision, positive ETCO2 and breath sounds checked- equal and bilateral Secured at: 22 cm Tube secured with: Tape Dental Injury: Teeth and Oropharynx as per pre-operative assessment

## 2021-04-15 NOTE — Anesthesia Postprocedure Evaluation (Signed)
Anesthesia Post Note  Patient: Mikayla Rios  Procedure(s) Performed: LAPAROSCOPIC ASSISTED HARTMANN RESECTION (Abdomen) COLOSTOMY CREATION/HARTMANN PROCEDURE (Abdomen)     Patient location during evaluation: ICU Anesthesia Type: General Level of consciousness: sedated Pain management: pain level controlled Vital Signs Assessment: post-procedure vital signs reviewed and stable Respiratory status: patient remains intubated per anesthesia plan Cardiovascular status: stable Postop Assessment: no apparent nausea or vomiting Anesthetic complications: no   No notable events documented.  Last Vitals:  Vitals:   04/15/21 0915 04/15/21 1644  BP: (!) 123/96 106/74  Pulse: (!) 113 (!) 106  Resp:  16  Temp: 37.6 C   SpO2: 100% 100%    Last Pain:  Vitals:   04/15/21 0915  TempSrc: Oral  PainSc:                  Alzada Brazee P Mishti Swanton

## 2021-04-15 NOTE — Progress Notes (Signed)
OT Cancellation Note  Patient Details Name: Mikayla Rios MRN: [id number] DOB: 04/12/1986   Cancelled Treatment:    Reason Eval/Treat Not Completed: Patient at procedure or test/ unavailable. Patient scheduled for surgery today. Will hold and continue to follow acutely.  Gilliam Hawkes L [name] 04/15/2021, 7:06 AM

## 2021-04-15 NOTE — Progress Notes (Signed)
Inpatient Rehab Admissions Coordinator:    I received insurance auth for CIR; however, will withdraw case and re-submit after pt.'s procedure.   Clemens Catholic, [place], [place] Admissions Coordinator  [phone] ([facility]) [phone] (office)

## 2021-04-15 NOTE — Anesthesia Preprocedure Evaluation (Addendum)
Anesthesia Evaluation  Patient identified by MRN, date of birth, ID band Patient awake    Reviewed: Allergy & Precautions, NPO status , Patient's Chart, lab work & pertinent test results  Airway Mallampati: II  TM Distance: >3 FB Neck ROM: Full    Dental no notable dental hx.    Pulmonary sleep apnea and Continuous Positive Airway Pressure Ventilation ,    Pulmonary exam normal breath sounds clear to auscultation       Cardiovascular hypertension, Pt. on medications and Pt. on home beta blockers  Rhythm:Regular Rate:Tachycardia  ECG: ST  ECHO: Left ventricular ejection fraction, by estimation, is 60 to 65%. The left ventricle has normal function. The left ventricle has no regional wall motion abnormalities. Indeterminate diastolic filling due to E-A fusion. Right ventricular systolic function is normal. The right ventricular size is normal. The mitral valve is normal in structure. No evidence of mitral valve regurgitation. No evidence of mitral stenosis. The aortic valve is normal in structure. Aortic valve regurgitation is not visualized. No aortic stenosis is present. The inferior vena cava is normal in size with greater than 50% respiratory variability, suggesting right atrial pressure of 3 mmHg.   Neuro/Psych negative neurological ROS  negative psych ROS   GI/Hepatic negative GI ROS, Neg liver ROS,   Endo/Other  negative endocrine ROS  Renal/GU negative Renal ROS     Musculoskeletal negative musculoskeletal ROS (+)   Abdominal (+) + obese,   Peds  Hematology  (+) anemia ,   Anesthesia Other Findings COMPLICATED DIVERTICULITIS WITH ABSCESS  Reproductive/Obstetrics hcg negative                            Anesthesia Physical Anesthesia Plan  ASA: 4  Anesthesia Plan: General   Post-op Pain Management:    Induction: Intravenous  PONV Risk Score and Plan: 3 and Ondansetron,  Dexamethasone, Midazolam and Treatment may vary due to age or medical condition  Airway Management Planned: Oral ETT  Additional Equipment:   Intra-op Plan:   Post-operative Plan: Extubation in OR  Informed Consent: I have reviewed the patients History and Physical, chart, labs and discussed the procedure including the risks, benefits and alternatives for the proposed anesthesia with the patient or authorized representative who has indicated his/her understanding and acceptance.     Dental advisory given  Plan Discussed with: CRNA  Anesthesia Plan Comments:        Anesthesia Quick Evaluation

## 2021-04-15 NOTE — Consult Note (Addendum)
Cardiology Consultation:   Patient ID: MONTSERRATH MADDING MRN: [id number]; DOB: 30-Jan-1986  Admit date: 04/10/2021 Date of Consult: 04/15/2021  PCP:  [name], MD   [facility] Providers Cardiologist:  None      Patient Profile:   Mikayla Rios is a 35 y.o. female with a PMH of HTN, inappropriate sinus tachycardia, OSA noncompliant with CPAP, and diverticulosis complicated by diverticulitis with abscess s/p IR placed LLQ drains 11/2020 who is being seen 04/15/2021 for the evaluation of tachycardia and preop assessment at the request of Dr Roderic Palau.  History of Present Illness:   Mikayla Rios is followed with cardiology in the past for evaluation of tachycardia and palpitations.  She was seen last by Dr. Gardiner Rhyme 12/2019 in follow-up of a recent cardiac monitor 11/2019 which did not show any significant arrhythmias.  She had an echocardiogram 11/2019 which showed normal biventricular function and no significant valvular abnormalities.  Her palpitations were reportedly stable on metoprolol succinate 25 mg twice daily and blood pressure noted to be improved with increase in lisinopril-hydrochlorothiazide and continued amlodipine.  CPAP compliance was encouraged and she was recommended to follow-up in 1 year with several rescheduled appointments over the past couple months.  Since her last visit with cardiology she was admitted to the hospital 11/2020 for abdominal pain due to diverticulitis with perforation.  Imaging showed multiple diverticular abscesses for which IR placed to [facility] drains.  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  She presented to [facility] ED 04/10/2021 with complaints of generalized weakness and lightheadedness.  In addition she was noted to have poor p.o. intake and gradual weight loss for which she was following with gastroenterology with  plans to eventually undergo endoscopic work-up to rule out malignancy.  She reported several falls in the past week or 2 as a result of weakness/lightheadedness though denied syncopal episode.  Given persistent symptoms she presented to the ED for further evaluation.  Hospital course: Blood pressures have been somewhat labile, initially elevated to 150/120 in the ED with readings as low as 96/69 over the past 24 hours, though generally stable.  She is also had intermittent tachycardia to the 120s. Initial EKG 04/10/2021 showed sinus tachycardia, rate 140 bpm, LVH with repolarization abnormalities, nonspecific T wave abnormalities, and prolonged QT interval.  Repeat EKG 04/11/2021 showed sinus tachycardia with improvement in T wave abnormalities now generally flat, rate 128 bpm, QTC 402.  Labs admission to present notable for K 2.7 > 3.5 with repletion, Cr 0.46, albumin 1.6, lactate WNL, WBC 14.1> 13.2, hemoglobin 8.4>7.5, platelet 696>407, troponin negative x1.  CXR showed no acute findings.  CTA C/A/P showed no evidence of PE, ongoing descending colon diverticulitis with perforation with free air and inflammation increased from previous, new lobulated enhancing fluid collection posterior to segment of inflamed colon invading adjacent iliopsoas muscle and extending into the left lateral abdominal wall.  Patient was admitted to medicine and started on IV antibiotics for intra-abdominal infection.  Drain was flushed by IR and aspirated with purulent and foul-smelling material and drain connected to P JP bulb.  Given poor progress with conservative management General surgery evaluated the patient with plans for colectomy and colostomy 04/15/2021.  Cardiology asked to evaluate for tachycardia and preop assessment.   Past Medical History:  Diagnosis Date   Allergy    seasonal   Anemia    Blood infection 1985  Blood transfusion without reported diagnosis    Diverticulitis    Hypertension    Obesity    Sleep  apnea     Past Surgical History:  Procedure Laterality Date   IR CATHETER TUBE CHANGE  12/12/2020   IR CATHETER TUBE CHANGE  01/27/2021   IR CATHETER TUBE CHANGE  02/19/2021   IR CATHETER TUBE CHANGE  04/13/2021   IR RADIOLOGIST EVAL & MGMT  11/27/2020   IR RADIOLOGIST EVAL & MGMT  12/11/2020   IR RADIOLOGIST EVAL & MGMT  01/07/2021   IR RADIOLOGIST EVAL & MGMT  02/04/2021   IR SINUS/FIST TUBE CHK-NON GI  12/12/2020   IR SINUS/FIST TUBE CHK-NON GI  02/19/2021     Home Medications:  Prior to Admission medications   Medication Sig Start Date End Date Taking? Authorizing Provider  acetaminophen (TYLENOL) 500 MG tablet Take 1,000 mg by mouth every 6 (six) hours as needed for moderate pain or headache.   Yes [provider]  amLODipine (NORVASC) 10 MG tablet TAKE 1 TABLET BY MOUTH EVERY DAY Patient taking differently: Take 10 mg by mouth every evening. 01/19/21  Yes [name], MD  dicyclomine (BENTYL) 10 MG capsule Take 1 capsule (10 mg total) by mouth 3 (three) times daily as needed for spasms. 03/12/21  Yes Sharyn Creamer, MD  lisinopril-hydrochlorothiazide (ZESTORETIC) 20-12.5 MG tablet TAKE 2 TABLETS BY MOUTH EVERY DAY Patient taking differently: Take 2 tablets by mouth daily. 12/10/20  Yes [name], MD  metoprolol succinate (TOPROL-XL) 25 MG 24 hr tablet TAKE 1 TABLET BY MOUTH TWICE A DAY Patient taking differently: Take 25 mg by mouth 2 (two) times daily. 04/10/21  Yes Ghumman, Milford Cage, NP  Prenatal Vit-Fe Fumarate-FA (PRENATAL MULTIVITAMIN) TABS tablet Take 1 tablet by mouth daily at 12 noon.   Yes [provider]  Simethicone (GAS-X PO) Take 1 tablet by mouth daily as needed (gas).   Yes [provider]  traMADol (ULTRAM) 50 MG tablet Take 50 mg by mouth every 6 (six) hours as needed for moderate pain. 12/29/20  Yes [provider]  benzonatate (TESSALON PERLES) 100 MG capsule Take 1 capsule (100 mg total) by mouth every 6  (six) hours as needed. Patient not taking: Reported on 04/10/2021 10/06/20 10/06/21  Minette Brine, FNP    Inpatient Medications: Scheduled Meds:  [MAR Hold] (feeding supplement) PROSource Plus  30 mL Oral Daily   acetaminophen  1,000 mg Oral On Call to OR   alvimopan  12 mg Oral On Call to OR   bupivacaine liposome  20 mL Infiltration Once   celecoxib  200 mg Oral On Call to OR   [MAR Hold] Chlorhexidine Gluconate Cloth  6 each Topical Daily   [MAR Hold] enoxaparin (LOVENOX) injection  40 mg Subcutaneous Q24H   enoxaparin (LOVENOX) injection  40 mg Subcutaneous On Call to OR   [MAR Hold] feeding supplement  1 Container Oral BID BM   [MAR Hold] feeding supplement  237 mL Oral V78I   [MAR Hold] folic acid  1 mg Oral Daily   gabapentin  300 mg Oral On Call to OR   [facility] Hold] lip balm  1 application Topical BID   [MAR Hold] metoprolol succinate  50 mg Oral BID   [MAR Hold] multivitamin with minerals  1 tablet Oral Daily   [MAR Hold] mupirocin ointment  1 application Nasal BID   [MAR Hold] sodium chloride flush  10 mL Intracatheter Q12H   [MAR Hold] sodium  chloride flush  10-40 mL Intracatheter Q12H   Continuous Infusions:  [MAR Hold] anidulafungin     clindamycin (CLEOCIN) IV     And   gentamicin     [MAR Hold] lactated ringers     lactated ringers 75 mL/hr at 04/15/21 0555   [MAR Hold] meropenem (MERREM) IV 1 g (04/15/21 0802)   [MAR Hold] methocarbamol (ROBAXIN) IV     PRN Meds: [MAR Hold] acetaminophen **OR** [MAR Hold] acetaminophen, [MAR Hold] alum & mag hydroxide-simeth, [MAR Hold] dicyclomine, [MAR Hold] diphenhydrAMINE, [MAR Hold]  HYDROmorphone (DILAUDID) injection, [MAR Hold] lactated ringers, [MAR Hold] magic mouthwash, [MAR Hold] melatonin, [MAR Hold] menthol-cetylpyridinium, [MAR Hold] methocarbamol (ROBAXIN) IV, [MAR Hold] metoprolol tartrate, [MAR Hold]  morphine injection, [MAR Hold] oxyCODONE, [MAR Hold] phenol, [MAR Hold] prochlorperazine, [MAR Hold] simethicone, [MAR  Hold] sodium chloride flush  Allergies:    Allergies  Allergen Reactions   Shellfish Allergy Hives   Penicillins Hives and Rash    Has patient had a PCN reaction causing immediate rash, facial/tongue/throat swelling, SOB or lightheadedness with hypotension: No Has patient had a PCN reaction causing severe rash involving mucus membranes or skin necrosis: hives Has patient had a PCN reaction that required hospitalization No Has patient had a PCN reaction occurring within the last 10 years: yes If all of the above answers are "NO", then may proceed with Cephalosporin use.     Social History:   Social History   Socioeconomic History   Marital status: Married    Spouse name: Errol   Number of children: 3   Years of education: Not on file   Highest education level: Not on file  Occupational History   Not on file  Tobacco Use   Smoking status: Never   Smokeless tobacco: Never  Vaping Use   Vaping Use: Never used  Substance and Sexual Activity   Alcohol use: Not Currently   Drug use: No   Sexual activity: Yes    Partners: Male    Birth control/protection: None  Other Topics Concern   Not on file  Social History Narrative   Not on file   Social Determinants of Health   Financial Resource Strain: Not on file  Food Insecurity: Not on file  Transportation Needs: Not on file  Physical Activity: Not on file  Stress: Not on file  Social Connections: Not on file  Intimate Partner Violence: Not on file    Family History:    Family History  Problem Relation Age of Onset   Cancer Mother        breast, and in remission   Diabetes Mother    Hypertension Mother    Colon cancer Mother        dx at age 28   Congestive Heart Failure Father    Hypertension Father    Diabetes Maternal Grandmother    Hypertension Maternal Grandmother    Diabetes Maternal Grandfather    Kidney disease Maternal Grandfather    Diabetes Paternal Grandmother    Cancer Paternal Grandfather         colon   Autism Son    Heart disease Paternal Aunt    Cancer Paternal Uncle        colon     ROS:  Please see the history of present illness.   Unable to obtain   Physical Exam/Data:   Vitals:   04/14/21 1743 04/14/21 2121 04/15/21 0644 04/15/21 0808  BP: 125/82 107/68 (!) 142/62 115/82  Pulse: (!) 118 Marland Kitchen)  115 78 (!) 105  Resp: 18 18    Temp: 98.9 F (37.2 C) 98.8 F (37.1 C) 98.3 F (36.8 C)   TempSrc: Oral Oral Oral   SpO2: 100% 98% 95%   Weight:      Height:        Intake/Output Summary (Last 24 hours) at 04/15/2021 0916 Last data filed at 04/15/2021 0104 Gross per 24 hour  Intake 2175.39 ml  Output --  Net 2175.39 ml   Last 3 Weights 04/10/2021 03/12/2021 01/27/2021  Weight (lbs) 200 lb 204 lb 250 lb  Weight (kg) 90.719 kg 92.534 kg 113.399 kg     Body mass index is 35.43 kg/m.   Exam not performed as patient was taken to surgery before we could complete our preoperative risk assessment consult.  EKG:  The EKG was personally reviewed and demonstrates:  Initial EKG 04/10/2021 showed sinus tachycardia, rate 140 bpm, LVH with repolarization abnormalities, nonspecific T wave abnormalities, and prolonged QT interval.  Repeat EKG 04/11/2021 showed sinus tachycardia with improvement in T wave abnormalities now generally flat, rate 128 bpm, QTC 402.  Telemetry:  Telemetry was personally reviewed and demonstrates:  Sinus tachycardia  Relevant CV Studies: N/A  Laboratory Data:  High Sensitivity Troponin:   Recent Labs  Lab 04/10/21 1531  TROPONINIHS 5     Chemistry Recent Labs  Lab 04/10/21 1707 04/11/21 0302 04/12/21 0525 04/13/21 0546  NA  --  135 136 136  K  --  4.3 3.8 3.5  CL  --  100 103 105  CO2  --  25 26 26   GLUCOSE  --  82 81 82  BUN  --  16 12 11   CREATININE  --  0.49 0.40* 0.45  CALCIUM  --  7.6* 7.3* 7.2*  MG 1.9 1.8  --   --   GFRNONAA  --  >60 >60 >60  ANIONGAP  --  10 7 5     Recent Labs  Lab 04/10/21 1446 04/11/21 0302  PROT 6.2*  6.2*  ALBUMIN 1.6* 1.7*  AST 16 16  ALT 19 19  ALKPHOS 143* 147*  BILITOT 0.6 0.7   Lipids No results for input(s): CHOL, TRIG, HDL, LABVLDL, LDLCALC, CHOLHDL in the last 168 hours.  Hematology Recent Labs  Lab 04/13/21 0546 04/14/21 0327 04/15/21 0342  WBC 16.1* 17.0* 13.2*  RBC 2.96* 2.69* 2.55*  HGB 8.5* 7.8* 7.5*  HCT 26.0* 23.8* 23.0*  MCV 87.8 88.5 90.2  MCH 28.7 29.0 29.4  MCHC 32.7 32.8 32.6  RDW 21.6* 22.0* 22.2*  PLT 514* 449* 407*   Thyroid No results for input(s): TSH, FREET4 in the last 168 hours.  BNPNo results for input(s): BNP, PROBNP in the last 168 hours.  DDimer No results for input(s): DDIMER in the last 168 hours.   Radiology/Studies:  IR Catheter Tube Change  Result Date: 04/14/2021 CLINICAL DATA:  Large intra-abscess. malfunctioning indwelling drain. EXAM: IR DRAINAGE CATHETER EVALUATION, EXCHANGE AND UPSIZE COMPARISON:  CT Abdomen Pelvis, 04/11/2019. IR fluoroscopy, 02/19/2021. CONTRAST:  10 mL Omnipaque 300-administered via the percutaneous drainage catheter. MEDICATIONS: None. ANESTHESIA/SEDATION: Local anesthetic was administered. FLUOROSCOPY TIME:  3 minutes 33 seconds.  10 mGy. TECHNIQUE: Patient was positioned supine on the fluoroscopy table. The external portion of the existing percutaneous drainage catheter as well as the surrounding skin was prepped and draped in usual sterile fashion. A preprocedural spot fluoroscopic image was obtained of the existing percutaneous drainage catheter. A small amount of contrast was injected via the  existing percutaneous drainage catheter and several fluoroscopic images were obtained in various obliquities. The external portion of the percutaneous drainage catheter was cut and cannulated with a short Amplatz wire. Under intermittent fluoroscopic guidance, the existing percutaneous drainage catheter was exchanged for a new 98 French percutaneous drainage catheter with end coiled and locked within the decompressed abscess  cavity. Contrast injection confirmed appropriate position functionality of the percutaneous drainage catheter. The percutaneous drainage catheter was connected to a gravity drainage bag and secured in place within 0-silk interrupted suture and a StatLock device. A dressing was applied. The patient tolerated the procedure well without immediate postprocedural complication. FINDINGS: 1. Malfunctioning drain with large residual intra-abdominal abscess. 2. Multi compartment abscess, with patient unable to tolerate further investigation and multiple drain placement. IMPRESSION: Successful fluoroscopic-guided LEFT lower quadrant drainage catheter exchange and upsize, as above. PLAN: Continue with previous drain care, including routine flushes for catheter patency. Interventional radiology will follow patient. Drain interrogation to be performed in 2 weeks, or if earlier suspected catheter malfunction. Michaelle Birks, MD [facility] Radiology Electronically Signed   By: Michaelle Birks M.D.   On: 04/14/2021 10:52   DG CHEST PORT 1 VIEW  Result Date: 04/12/2021 CLINICAL DATA:  PICC placement EXAM: PORTABLE CHEST 1 VIEW COMPARISON:  04/10/2021 FINDINGS: Right arm PICC tip in the proximal SVC. Lungs remain clear without infiltrate or effusion. IMPRESSION: PICC tip in the proximal SVC.  Lungs are clear Electronically Signed   By: Franchot Gallo M.D.   On: 04/12/2021 19:55   ECHOCARDIOGRAM COMPLETE  Result Date: 04/14/2021    ECHOCARDIOGRAM REPORT   Patient Name:   EDYN POPOCA Dauphin Date of Exam: 04/14/2021 Medical Rec #:  [id number]      Height:       63.0 in Accession #:    [id number]     Weight:       200.0 lb Date of Birth:  [birth year]       BSA:          1.934 m Patient Age:    40 years       BP:           109/82 mmHg Patient Gender: F              HR:           133 bpm. Exam Location:  Inpatient Procedure: 2D Echo, Cardiac Doppler and Color Doppler Indications:    I47.2  Ventricular tachycardia  History:        Patient has prior history of Echocardiogram examinations, most                 recent 11/09/2019. Arrythmias:Prolonged QT interval; Risk                 Factors:Hypertension.  Sonographer:    Glo Herring Referring Phys: Theola Sequin IMPRESSIONS  1. Left ventricular ejection fraction, by estimation, is 60 to 65%. The left ventricle has normal function. The left ventricle has no regional wall motion abnormalities. Indeterminate diastolic filling due to E-A fusion.  2. Right ventricular systolic function is normal. The right ventricular size is normal.  3. The mitral valve is normal in structure. No evidence of mitral valve regurgitation. No evidence of mitral stenosis.  4. The aortic valve is normal in structure. Aortic valve regurgitation is not visualized. No aortic stenosis is present.  5. The inferior vena cava is normal in size with greater than 50% respiratory variability,  suggesting right atrial pressure of 3 mmHg. FINDINGS  Left Ventricle: Left ventricular ejection fraction, by estimation, is 60 to 65%. The left ventricle has normal function. The left ventricle has no regional wall motion abnormalities. The left ventricular internal cavity size was normal in size. There is  no left ventricular hypertrophy. Indeterminate diastolic filling due to E-A fusion. Right Ventricle: The right ventricular size is normal. No increase in right ventricular wall thickness. Right ventricular systolic function is normal. Left Atrium: Left atrial size was normal in size. Right Atrium: Right atrial size was normal in size. Pericardium: There is no evidence of pericardial effusion. Mitral Valve: The mitral valve is normal in structure. No evidence of mitral valve regurgitation. No evidence of mitral valve stenosis. Tricuspid Valve: The tricuspid valve is normal in structure. Tricuspid valve regurgitation is not demonstrated. No evidence of tricuspid stenosis. Aortic Valve: The  aortic valve is normal in structure. Aortic valve regurgitation is not visualized. No aortic stenosis is present. Aortic valve mean gradient measures 4.0 mmHg. Aortic valve peak gradient measures 8.6 mmHg. Pulmonic Valve: The pulmonic valve was normal in structure. Pulmonic valve regurgitation is not visualized. No evidence of pulmonic stenosis. Aorta: The aortic root is normal in size and structure. Venous: The inferior vena cava is normal in size with greater than 50% respiratory variability, suggesting right atrial pressure of 3 mmHg. IAS/Shunts: No atrial level shunt detected by color flow Doppler.  LEFT VENTRICLE PLAX 2D LVIDd:         3.80 cm LVIDs:         2.30 cm LV PW:         1.00 cm LV IVS:        1.00 cm  RIGHT VENTRICLE RV S prime:     21.90 cm/s LEFT ATRIUM             Index LA diam:        3.50 cm 1.81 cm/m LA Vol (A2C):   32.1 ml 16.60 ml/m LA Vol (A4C):   29.5 ml 15.26 ml/m LA Biplane Vol: 32.3 ml 16.70 ml/m  AORTIC VALVE                   PULMONIC VALVE AV Vmax:           147.00 cm/s PV Vmax:       1.21 m/s AV Vmean:          96.600 cm/s PV Peak grad:  5.9 mmHg AV VTI:            0.168 m AV Peak Grad:      8.6 mmHg AV Mean Grad:      4.0 mmHg LVOT Vmax:         85.50 cm/s LVOT Vmean:        53.100 cm/s LVOT VTI:          0.097 m LVOT/AV VTI ratio: 0.58  AORTA Ao Root diam: 3.00 cm Ao Asc diam:  2.90 cm  SHUNTS Systemic VTI: 0.10 m Dani Gobble Croitoru MD Electronically signed by Sanda Klein MD Signature Date/Time: 04/14/2021/1:18:01 PM    Final    [place] EKG SITE RITE  Result Date: 04/12/2021 If Site Rite image not attached, placement could not be confirmed due to current cardiac rhythm.    Assessment and Plan:   This is a 35 yo female with history of inappropriate sinus tach, who has had sinus tachycardia here as well - which may be more appropriate given her  underlying medical condition. We were asked to provide preoperative risk assessment, however, the patient was taken to the OR before we  could examine her. Nonetheless, her echo was normal and reassuring - do not feel she needs ongoing cardiology management. There was a noted QT prolongation - this is likely due to low amplitude T waves - potassium noted to be 3.4 and magnesium is quite low at 1.3. Will replete to magnesium >2.0 and potassium >4.0. Defer further management to hospital medicine. Avoid QT prolonging agents.   Risk Assessment/Risk Scores:     [facility] will sign off.   Medication Recommendations:  none Other recommendations (labs, testing, etc):  none Follow up as an outpatient:  PRN  For questions or updates, please contact [facility] Please consult [email or web address] for contact info under   Pixie Casino, MD, FACC, [place] Director of the [facility] Lipid [facility]  Card[facility]ovascular [facility] Reduction [facility] Diplomate of the American Board of Clinical Lipidology Attending Cardiologist  Direct Dial: [phone]  Fax: [phone]  Website:  [email or web address]

## 2021-04-15 NOTE — Op Note (Signed)
04/15/2021  4:22 PM  PATIENT:  Mikayla Rios  35 y.o. female  Patient Care Team: [name], MD as PCP - General (Internal Medicine) [name], MD as PCP - Cardiology (Cardiology) [name], [name] as Physician Assistant (Emergency Medicine) Leighton Ruff, MD as Consulting Physician (General Surgery)  PRE-OPERATIVE DIAGNOSIS:  CHRONIC PERFORATED COLON WITH ABSCESS  POST-OPERATIVE DIAGNOSIS:   CHRONIC PERFORATED COLON WITH RETROPERITONEAL ABSCESS PARTIAL SMALL BOWEL OBSTRUCTION PARTIAL COLON OBSTRUCTION  PROCEDURE:  LAPAROSCOPIC ASSISTED HARTMANN  (LEFT COLECTOMY / END COLOSTOMY)  MOBILIZATION OF SPLENIC FLEXURE OF COLON SMALL BOWEL RESECTION LEFT RETROPERITONEAL / FLANK ABSCESS DRAINAGE  SURGEON:  Adin Hector, MD  ASSISTANT: Obie Dredge, PA-C    ANESTHESIA:     General  Local field block at port sites & extraction wound  EBL:  Total I/O In: 3988.5 [I.V.:2500; Blood:630; IV Piggyback:858.5] Out: 454 [Urine:575; Blood:200]  Delay start of Pharmacological VTE agent (>24hrs) due to surgical blood loss or risk of bleeding:  no  DRAINS: 17 Fr drain goes from right flank down to pelvis with tip going along the left gutter and left retroperitoneal/flank chronic     SPECIMEN:   Left colon from proximal descending colon to rectosigmoid.  Chronic concrete perforation of mid descending colon  Jejunum at site of perforation  DISPOSITION OF SPECIMEN:  PATHOLOGY  COUNTS:  YES  PLAN OF CARE: Admit to inpatient   PATIENT DISPOSITION:  ICU - intubated and critically ill.  INDICATION:    Pleasant morbidly obese woman who has been struggling with chronic left colon perforation for 6 months.  Trying to control with antibiotic and drains.  Resistant to surgery.  However readmitted after drain migration with large abscess reformation.  Drain replaced still with pain and now with tachycardia.  Frank feculent output.  I long discussion  with patient and her husband and recommended Hartmann resection.  Laparoscopic possible open approach:  The anatomy & physiology of the digestive tract was discussed.  The pathophysiology was discussed.  Natural history risks without surgery was discussed.   I worked to give an overview of the disease and the frequent need to have multispecialty involvement.  I feel the risks of no intervention will lead to serious problems that outweigh the operative risks; therefore, I recommended a partial colectomy to remove the pathology.  Laparoscopic & open techniques were discussed.   Risks such as bleeding, infection, abscess, leak, reoperation, possible ostomy, hernia, heart attack, death, and other risks were discussed.  I noted a good likelihood this will help address the problem.   Goals of post-operative recovery were discussed as well.  We will work to minimize complications.  Educational materials on the pathology had been given in the office.  Questions were answered.    The patient expressed understanding & wished to proceed with surgery.  OR FINDINGS:   Patient had dilated small bowel with transition point due to jejunum adherent to the left descending colon with chronic abscess.  Concretely thickened left descending colon consistent with chronic perforation into the left posterior flank tracking along the retroperitoneum lateral to the kidney.  Connected with chronic perforated abscess or percutaneous drain rested.  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  No evidence of carcinomatosis argues against perforated cancer.  And distal descending colostomy at the left upper quadrant.  Drain as above.  No obvious metastatic disease on visceral parietal peritoneum or liver.  CASE DATA:  Type of patient?: LDOW CASE (Surgical  Hospitalist [facility] Inpatient)  Status of Case? URGENT Add On  Infection Present At Time Of Surgery (PATOS)?  ABSCESS in the left  posterior retroperitoneum/flank   DESCRIPTION:   Informed consent was confirmed.  The patient underwent general anaesthesia without difficulty.  The patient was positioned appropriately.  VTE prevention in place.  The patient's abdomen was clipped, prepped, & draped in a sterile fashion.  Surgical timeout confirmed our plan.  The patient was positioned in reverse Trendelenburg.  Abdominal entry was gained using optical entry technique in the  right upper abdomen.  Entry was clean.  I induced carbon dioxide insufflation.  Camera inspection revealed no injury.  Extra ports were carefully placed under direct laparoscopic visualization.  Upon entering the abdomen (organ space), I encountered very dilated small intestine with transition zone along the left descending colon and dense adhesions consistent at site of chronic perforation.  The mid to distal sigmoid colon was more decompressed and not inflamed.  Colon proximal was rather dilated.  Concerning for partial obstructions of small bowel and colon.  Patient had chronic ascites.  I placed extra ports.  I freed greater omental adhesions to the pelvis and left flank.  Aspirated the ascites is well  I elevated the sigmoid mesentery and enetered into the retro-mesenteric plane. We were able to identify the left ureter and gonadal vessels. We kept those posterior within the retroperitoneum and elevated the left colon mesentery off that. I did isolated IMA pedicle but did not ligate it yet.  I continued distally and got into the avascular plane posterior to the mesorectum. This allowed me to help mobilize the rectum as well by freeing the mesorectum off the sacrum.  I mobilized the peritoneal coverings towards the peritoneal reflection on both the right and left sides of the rectum.  I could see the right and left ureters and stayed away from them.  I kept the lateral vascular pedicles to the rectum intact.  I skeletonized the lymph nodes off the inferior  mesenteric artery pedicle.  I went down to its takeoff from the aorta.   I isolated the inferior mesenteric vein off of the ligament of Treitz just cephalad to that as well.  After confirming the left ureter was out of the way, I went ahead and ligated the inferior mesenteric artery pedicle with bipolar EnSeal just near its takeoff from the aorta.  I did ligate the inferior mesenteric vein in a similar fashion.  We ensured hemostasis.  I tried to free the small intestine off of the dense adhesions to the left ascending colon and retroperitoneum.  However I cannot feel a good safe plane.  There had been concern of connection to small bowel at a prior drain placement months ago which may explain why there was dense adhesions here.  There is no carcinomatosis.  While there is inflammation with obvious chronic phlegmon of the left descending colon, there is no intraperitoneal peritonitis.  I worked to try and elevate the left colon mesentery off the retroperitoneum but encountered concrete adhesions in the mid descending colon.  I worked to mobilize the rectosigmoid colon in a lateral to medial fashion and again came up to the left flank line of Toldt at the mid descending colon.  However this was extremely hard is concrete.  Try to use an Enseal to come through it but the Enseal fractured.  Remove the Enseal and the other piece and got a new Enseal.    Try to use scissors but it  could not cut through it and cautery would not work either.  I decided I would have to finish this through a supraumbilical incision.  I therefore worked to resect the distal margin at the rectosigmoid junction on the presumption this was due to diverticulitis.  I skeletonized the mesorectum at the junction at the proximal rectum using blunt dissection & bipolar EnSeal.  I transected at the rectosigmoid junction using a laparoscopic blue load stapler 60 mm.  Came across 90% with the first firing and 1 more firing on the right lateral  aspect.  I created an extraction incision through a supraumbilical midline incision to the umbilicus.  I placed a wound protector.  I had to use finger fracture technique in sharp mayor scissor dissection to mobilize the colon in a lateral to medial fashion off the left flank and retroperitoneum.  With this I could eviscerate the left colon and the small bowel adherent to it.  At this point the patient became bradycardic and hypotensive.  Aggressive resuscitation was done.  Patient placed on pressors.  She was given 2 units of blood.  Tachycardia and blood pressure improved.  She was able to be weaned down on her pressors to minimal rate by the end of the case.  Because had the specimen nearly out I decided proceed with surgery.  Of note to the descending colon had a massive perforation that was chronically there.  It looked atypical concerning for possible latency but there was no definite mucosal mass.  It was full-thickness.  I freed the small bowel off the left colon mesentery for better visualization.  It did not look viable and did have some chronic phlegmon on the mesentery there like it did try to wall this off as well.  I decided to do a small bowel resection at this distal jejunum.  I did a side-to-side stapled anastomosis using a 75 GIA.  I used a sucker to decompress the fecalized small bowel contents over a liter.  That decompressed the bowel and it looked much happier.  I closed his common staple defect with a TX 90 stapler.  Mesentery been taken with Enseal bipolar as well as silk suture clamps at the base..  I closed the mesenteric defect in an inverse T fashion with mesentery covering the TX staple line for most of it and then a few vertical stitches near the base.  Anastomosis that had healthy bleeding mucosa looked pink and viable.  Because the bowel had been decompressed at this obstruction point it looked much happier.  At this point had anesthesia switch from orogastric to nasogastric tube  and had good placement with better stomach decompression as well.  I mobilized the splenic flexure of the colon in a superior to inferior fashion from the mid transverse colon by freeing some greater omentum off.  Fortunately the splenic flexure was already rather mobile and I can get it to come up to the midline in a lateral to medial fashion.  Preserved a good left middle colic pedicle.  With that the colon could come up through her very thick abdominal wall much more easily.  Chose an area in the proximal descending colon 7 cm proximal to the perforation and came across with a GIA stapler.  Specimen sent off.  Did inspection.  Confirmed good hemostasis of the small bowel and colon mesentery and retroperitoneum.  I focused at the left posterior retroperitoneum.  Remove the pigtail catheter.  We probed from the drain side to find the  opening.  The epicenter was in the left lateral retroperitoneum.  I opened that up and placed my finger and I can finger sweep along the left posterior flank/retroperitoneum to the tip my finger on both sides.  Able debride out some necrotic fat as well.  I sharply freed off some chronic abscess small cavity as well.  I was not able to get an R0 resection.  I did copious irrigation and we had much more clear return.  No bleeding on the rectal stump.  It went distal to her enlarged uterus.  Axilla normal.  Ran the small bowel from ileocecal valve to the ligament of Treitz and confirmed no injury or other abnormality and anastomosis looked viable.  Allowed that to return.  Got some greater omentum to come in to the midline and I did have some go towards the left flank.  Placed a drain as noted above with the tip going into the left retroperitoneum.  I chose an area in the left upper quadrant that been premarked where the staple reported been and created a 3 cm cylindrical skin defect.  Removed a column of fat to the anterior fascia and opened up that transversely.  Her fascia layer  was actually quite thin.  Came through the posterior rectus fascia vertically and dilated to 2 fingers.  We brought the end colostomy up through that and it came out well and viable.  Even though this was an infected case, I decided to reprep and drape and new gowns and clean instruments.  We closed the fascia using #1 PDS in a running fashion from each corner meeting in the middle.  I did not want to close the skin.  Umbilicus was rather of poor quality been up so that had to be excised.  I placed a wound VAC in the subcutaneous tissues.  Drain site secured with Prolene suture port sites closed.  We focused on colostomy maturation using interrupted Vicryl suture in 4 corners and then created a good Brooke 3 cm tall end colostomy.  Tissues very edematous but viable.  Finger intubated well.  Appliance placed.  By the end of case she had been weaned off almost all of her pressors.  She did receive 2 units of blood.  Her urine output was excellent and blood pressure was good.  She was not tachycardic.  However think if she had been through anesthesia and specimen is self felt it would be wisest to keep her intubated to watch overnight and make sure that she does not have any further decline or other issues.  Critical care was consulted.  She has transferred to the intensive care unit intubated.  I called discussed with the patient's husband, Trilby Leaver.  Explained interoperative findings and her shock and reason for keeping her intubated with close ICU consultation and follow-up.  Questions answered.  He expressed understanding appreciation.   Adin Hector, M.D., F.A.C.S. Gastrointestinal and Minimally Invasive Surgery [facility] [address], [place], [place] [address] [phone] Main / Paging

## 2021-04-15 NOTE — Plan of Care (Signed)

## 2021-04-16 ENCOUNTER — Encounter (HOSPITAL_COMMUNITY): Payer: Self-pay | Admitting: Surgery

## 2021-04-16 ENCOUNTER — Inpatient Hospital Stay (HOSPITAL_COMMUNITY): Payer: BC Managed Care – PPO

## 2021-04-16 DIAGNOSIS — A419 Sepsis, unspecified organism: Principal | ICD-10-CM

## 2021-04-16 DIAGNOSIS — Z9911 Dependence on respirator [ventilator] status: Secondary | ICD-10-CM | POA: Diagnosis not present

## 2021-04-16 DIAGNOSIS — R6521 Severe sepsis with septic shock: Secondary | ICD-10-CM

## 2021-04-16 DIAGNOSIS — K5720 Diverticulitis of large intestine with perforation and abscess without bleeding: Secondary | ICD-10-CM | POA: Diagnosis not present

## 2021-04-16 LAB — TYPE AND SCREEN
ABO/RH(D): A POS
Antibody Screen: NEGATIVE
Unit division: 0
Unit division: 0
Unit division: 0

## 2021-04-16 LAB — BPAM RBC
Blood Product Expiration Date: 202211222359
Blood Product Expiration Date: 202212052359
Blood Product Expiration Date: 202212052359
ISSUE DATE / TIME: 202211062211
ISSUE DATE / TIME: 202211091420
ISSUE DATE / TIME: 202211091420
Unit Type and Rh: 6200
Unit Type and Rh: 6200
Unit Type and Rh: 6200

## 2021-04-16 LAB — COMPREHENSIVE METABOLIC PANEL
ALT: 11 U/L (ref 0–44)
AST: 15 U/L (ref 15–41)
Albumin: 1.7 g/dL — ABNORMAL LOW (ref 3.5–5.0)
Alkaline Phosphatase: 62 U/L (ref 38–126)
Anion gap: 8 (ref 5–15)
BUN: 12 mg/dL (ref 6–20)
CO2: 23 mmol/L (ref 22–32)
Calcium: 7.2 mg/dL — ABNORMAL LOW (ref 8.9–10.3)
Chloride: 101 mmol/L (ref 98–111)
Creatinine, Ser: 0.59 mg/dL (ref 0.44–1.00)
GFR, Estimated: >60 mL/min (ref >60)
Glucose, Bld: 290 mg/dL — ABNORMAL HIGH (ref 70–99)
Potassium: 3.8 mmol/L (ref 3.5–5.1)
Sodium: 132 mmol/L — ABNORMAL LOW (ref 135–145)
Total Bilirubin: 0.6 mg/dL (ref 0.3–1.2)
Total Protein: 4.2 g/dL — ABNORMAL LOW (ref 6.5–8.1)

## 2021-04-16 LAB — PHOSPHORUS: Phosphorus: 4.3 mg/dL (ref 2.5–4.6)

## 2021-04-16 LAB — GLUCOSE, CAPILLARY
Glucose-Capillary: 140 mg/dL — ABNORMAL HIGH (ref 70–99)
Glucose-Capillary: 123 mg/dL — ABNORMAL HIGH (ref 70–99)
Glucose-Capillary: 131 mg/dL — ABNORMAL HIGH (ref 70–99)
Glucose-Capillary: 105 mg/dL — ABNORMAL HIGH (ref 70–99)
Glucose-Capillary: 119 mg/dL — ABNORMAL HIGH (ref 70–99)
Glucose-Capillary: 124 mg/dL — ABNORMAL HIGH (ref 70–99)

## 2021-04-16 LAB — CBC
HCT: 26.8 % — ABNORMAL LOW (ref 36.0–46.0)
Hemoglobin: 9.2 g/dL — ABNORMAL LOW (ref 12.0–15.0)
MCH: 31 pg (ref 26.0–34.0)
MCHC: 34.3 g/dL (ref 30.0–36.0)
MCV: 90.2 fL (ref 80.0–100.0)
Platelets: 372 10*3/uL (ref 150–400)
RBC: 2.97 MIL/uL — ABNORMAL LOW (ref 3.87–5.11)
RDW: 18.1 % — ABNORMAL HIGH (ref 11.5–15.5)
WBC: 28.1 10*3/uL — ABNORMAL HIGH (ref 4.0–10.5)
nRBC: 0.1 % (ref 0.0–0.2)

## 2021-04-16 LAB — CORTISOL: Cortisol, Plasma: 76.1 ug/dL

## 2021-04-16 LAB — MAGNESIUM: Magnesium: 1.7 mg/dL (ref 1.7–2.4)

## 2021-04-16 LAB — TRIGLYCERIDES: Triglycerides: 100 mg/dL (ref <150)

## 2021-04-16 MED ORDER — ALBUMIN HUMAN 25 % IV SOLN
25.0000 g | Freq: Four times a day (QID) | INTRAVENOUS | Status: AC
  Administered 2021-04-16 (×2): 25 g via INTRAVENOUS
  Filled 2021-04-16 (×2): qty 100

## 2021-04-16 MED ORDER — METOPROLOL TARTRATE 5 MG/5ML IV SOLN
2.5000 mg | Freq: Once | INTRAVENOUS | Status: AC
  Administered 2021-04-16: 2.5 mg via INTRAVENOUS
  Filled 2021-04-16: qty 5

## 2021-04-16 MED ORDER — POTASSIUM CHLORIDE 10 MEQ/50ML IV SOLN
10.0000 meq | INTRAVENOUS | Status: AC
  Administered 2021-04-16 (×2): 10 meq via INTRAVENOUS
  Filled 2021-04-16 (×2): qty 50

## 2021-04-16 MED ORDER — INSULIN ASPART 100 UNIT/ML IJ SOLN
0.0000 [IU] | Freq: Four times a day (QID) | INTRAMUSCULAR | Status: DC
Start: 2021-04-16 — End: 2021-05-01
  Administered 2021-04-16 – 2021-04-30 (×7): 1 [IU] via SUBCUTANEOUS

## 2021-04-16 MED ORDER — MAGNESIUM SULFATE 2 GM/50ML IV SOLN
2.0000 g | Freq: Once | INTRAVENOUS | Status: AC
Start: 2021-04-16 — End: 2021-04-16
  Administered 2021-04-16: 2 g via INTRAVENOUS
  Filled 2021-04-16: qty 50

## 2021-04-16 MED ORDER — MAGNESIUM SULFATE 2 GM/50ML IV SOLN: 2.0000 g | Freq: Once | INTRAVENOUS | Status: DC

## 2021-04-16 MED ORDER — TRAVASOL 10 % IV SOLN
INTRAVENOUS | Status: AC
  Filled 2021-04-16: qty 480

## 2021-04-16 MED ORDER — VASOPRESSIN 20 UNITS/100 ML INFUSION FOR SHOCK
0.0000 [IU]/min | INTRAVENOUS | Status: DC
  Administered 2021-04-16: 0.03 [IU]/min via INTRAVENOUS
  Filled 2021-04-16: qty 100

## 2021-04-16 MED ORDER — LACTATED RINGERS IV SOLN: INTRAVENOUS | Status: DC

## 2021-04-16 MED ORDER — FENTANYL CITRATE (PF) 100 MCG/2ML IJ SOLN
25.0000 ug | INTRAMUSCULAR | Status: DC | PRN
  Administered 2021-04-16 – 2021-04-17 (×5): 50 ug via INTRAVENOUS
  Filled 2021-04-16 (×5): qty 2

## 2021-04-16 NOTE — Progress Notes (Signed)
TNA hung.  Noted PICC to be in upper SVC.  Dellis Filbert RN notified who stated he notified Noe Gens NP re need for PICC exchange.

## 2021-04-16 NOTE — Progress Notes (Signed)
NAME:  Mikayla Rios, MRN:  [id number], DOB:  May 03, 1986, LOS: 6 ADMISSION DATE:  04/10/2021, CONSULTATION DATE:  04/15/32 REFERRING MD:  Tawana Scale, CHIEF COMPLAINT:  septic shock   History of Present Illness:  35 y/o F with a history of perforated diverticulitis in May 2022, requiring 2 drains for abscesses. She had progressive weight loss, poor PO intake, and weakness with lightheadedness, prompting readmission to the hospital. She had developed an enterocolonic fistula draining stool. CT demonstrated a large LLQ abscess behind the descending colon. She was treated with broad spetrum antibiotics and underwent laparoscopic left colectomy with end colostomy and partial small bowel resection with left retroperitoneal and flank abscess drainage. Intraoperatively she developed bradycardia and hypotension.  She was transferred to the ICU intubated and on vasopressors. Arterial line placed intra-operatively and she received 1 unit pRBCs, although there was low EBL.   Pertinent  Medical History  OSA Perforated diverticulitis May 2022 HTN Obesity Chronic sinus tachycardia COVID May 2022   Significant Hospital Events: Including procedures, antibiotic start and stop dates in addition to other pertinent events   11/4 admitted to hospital - weakness, lightheadedness> large abscess in LLQ . Has chronic enterocolonic fistula. 11/9 colectomy with colostomy, A-line insertion, brought back to the ICU on vent 11/10 On vent/SBT, calm, ST persists, 50ml/hr UOP  Interim History / Subjective:  Tmax 99.3 / WBC 28.1 PSV wean 8/5, 30% Glucose control 123-290 I/O 895 UOP, 300 ml NGT, +4.6L in last 24 hours  Vasopressin weaned off, on levophed 12 mcg Remains sinus tach   Objective   Blood pressure 118/84, pulse (!) 130, temperature 99.3 F (37.4 C), temperature source Axillary, resp. rate 19, height 5\' 3"  (1.6 m), weight 99.5 kg, last menstrual period 04/10/2021, SpO2 100 %.    Vent Mode: PSV;CPAP FiO2 (%):   [30 %-50 %] 30 % Set Rate:  [16 bmp] 16 bmp Vt Set:  [420 mL] 420 mL PEEP:  [5 cmH20] 5 cmH20 Pressure Support:  [8 cmH20] 8 cmH20 Plateau Pressure:  [16 cmH20] 16 cmH20   Intake/Output Summary (Last 24 hours) at 04/16/2021 0947 Last data filed at 04/16/2021 6440 Gross per 24 hour  Intake 6950.32 ml  Output 2185 ml  Net 4765.32 ml   Filed Weights   04/10/21 1436 04/15/21 0808 04/15/21 1800  Weight: 90.7 kg 123.6 kg 99.5 kg    Examination: General: acutely ill appearing adult female lying in bed on vent in NAD HEENT: MM pink/moist, ETT, anicteric, mild exophthalmus  Neuro: awakens to voice, on fentanyl infusion at 69mcg, follows commands, writes messages / appropriate CV: s1s2 RRR, ST on monitor, no m/r/g PULM: non-labored at rest on PSV, lungs bilaterally clear with good air entry, scant thin secretions  GI: soft, bsx4 hypoactive, colostomy stoma pink with brown stool in bag, JP drain with serosanguinous drainage, old drain site with brown / red drainage on dressing, VAC in place Extremities: warm/dry, no overt edema  Skin: no rashes or lesions  Resolved Hospital Problem list     Assessment & Plan:   Acute respiratory failure with hypoxia post operatively H/o OSA -PRVC 8cc/kg as rest mode, no underlying pulmonary parenchymal disease -PSV / SBT as tolerated with goal for extubation  -wean PEEP / fiO2 for sats >90% -PAD protocol for sedation -VAP prevention measures  -follow intermittent CXR -may need nocturnal positive pressure post extubation QHS  Septic shock due to intraabdominal abscess from perforated diverticulitis, s/p multiple previous drains, now with enterocolonic fistula. S/p colectomy and  diverting ostomy. E-Coli UTI (POS) -post operative & wound care per [facility]  -continue eraxis, meropenem -vasopressors for MAP >65, trial change to vasopressin and wean levophed off given HR -follow drain output, clinical abdominal exam -assess cortisol, rule out relative  adrenal insufficiency  -1L LR now + albumin  Sinus Tachycardia  -baseline tachy, seen by cardiology  -fluids / albumin as above -transition off levophed given rate and reassess -2.5mg  IV lopressor x1 now  -unable to give amiodarone with QT  Acute on Chronic anemia in setting of Critical Illness  -trend CBC -transfuse for Hgb <7%, active bleeding   Hypokalemia Hyponatremia  Hypomagnesemia  -follow lytes, replace as indicated - Mg+ 2gm 11/10 -follow up Mg+ in am   Morbid obesity Severe Protein Calorie Malnutrition -supportive care  -long term modest weight loss goal   At risk for AKI -Trend BMP / urinary output -Replace electrolytes as indicated -Avoid nephrotoxic agents, ensure adequate renal perfusion  Hyperglycemia -SSI, continue sensitive scale  -? If random glucose was drawn off line / false data given FS glucose range of 120-140, Hgb A1c 4   Best Practice (right click and "Reselect all SmartList Selections" daily)  Diet/type: NPO DVT prophylaxis: SCD GI prophylaxis: PPI Lines: Central line, Arterial Line, and yes and it is still needed Foley:  Yes, and it is still needed Code Status:  full code Last date of multidisciplinary goals of care discussion: full code  Critical care time: 1 minutes    Noe Gens, MSN, APRN, NP-C, AGACNP-BC [facility] 04/16/2021, 9:47 AM   Please see [email or web address] for pager details.   From 7A-7P if no response, please call [phone] After hours, please call ELink [phone]

## 2021-04-16 NOTE — Progress Notes (Addendum)
1 Day Post-Op  Subjective: CC: Sedation weaned this AM, patient is alert and FC on vent. Denies pain. Endorses anxiety. Mother and sister are at the bedside.   Objective: Vital signs in last 24 hours: Temp:  [97.4 F (36.3 C)-99.6 F (37.6 C)] 99.3 F (37.4 C) (11/10 0400) Pulse Rate:  [82-130] 130 (11/10 0730) Resp:  [16-19] 19 (11/10 0730) BP: (81-123)/(67-96) 118/84 (11/10 0730) SpO2:  [100 %] 100 % (11/10 0730) Arterial Line BP: (79-120)/(53-83) 119/83 (11/10 0715) FiO2 (%):  [30 %-50 %] 30 % (11/10 0730) Weight:  [99.5 kg] 99.5 kg (11/09 1800) Last BM Date: 04/16/21  Intake/Output from previous day: 11/09 0701 - 11/10 0700 In: 6838.3 [I.V.:4949.6; Blood:630; IV Piggyback:1258.7] Out: 2185 [Urine:895; Emesis/NG output:300; Drains:740; Stool:50; Blood:200] Intake/Output this shift: Total I/O In: 9 [I.V.:9] Out: -   PE: Gen:  Alert, cooperative Card: sinus tachycardia, up to 143 bpm during my exam Pulm:  ventilated respirations, CTAB Abd: Soft, obese, appropriately tender, VAC to midline wound, JP in RLQ SS, LUQ stoma is edematous and viable- scant amt solid nonbloody stool in pouch, no gas in ostomy pouch.  NG - 400 cc/24h  RLQ Blake drain (in L retroperitoneal space) - 500 cc/24h ss   Psych: unable to assess, intubated  Lab Results:  Recent Labs    04/15/21 1815 04/16/21 0500  WBC 22.3* 28.1*  HGB 9.7* 9.2*  HCT 28.9* 26.8*  PLT 363 372   BMET Recent Labs    04/15/21 1815 04/16/21 0500  NA 135 132*  K 3.7 3.8  CL 106 101  CO2 24 23  GLUCOSE 150* 290*  BUN 10 12  CREATININE 0.35* 0.59  CALCIUM 7.4* 7.2*   PT/INR No results for input(s): LABPROT, INR in the last 72 hours. CMP     Component Value Date/Time   NA 132 (L) 04/16/2021 0500   NA 139 12/21/2019 1024   K 3.8 04/16/2021 0500   CL 101 04/16/2021 0500   CO2 23 04/16/2021 0500   GLUCOSE 290 (H) 04/16/2021 0500   BUN 12 04/16/2021 0500   BUN 13 12/21/2019 1024   CREATININE 0.59  04/16/2021 0500   CALCIUM 7.2 (L) 04/16/2021 0500   PROT 4.2 (L) 04/16/2021 0500   PROT 7.4 07/11/2019 1638   ALBUMIN 1.7 (L) 04/16/2021 0500   ALBUMIN 4.6 07/11/2019 1638   AST 15 04/16/2021 0500   ALT 11 04/16/2021 0500   ALKPHOS 62 04/16/2021 0500   BILITOT 0.6 04/16/2021 0500   BILITOT 0.2 07/11/2019 1638   GFRNONAA >60 04/16/2021 0500   GFRAA 96 12/21/2019 1024   Lipase     Component Value Date/Time   LIPASE 21 11/04/2020 1119    Studies/Results: DG Abd 1 View  Result Date: 04/15/2021 CLINICAL DATA:  Incorrect needle count. EXAM: ABDOMEN - 1 VIEW COMPARISON:  None. FINDINGS: There is an extensive amount of overlying tubing and suction bulb which may obscure a small needle. Large amount of soft tissue gas is noted. This may also obscure needle. No definite evidence of surgical needle is noted. IMPRESSION: Exam is significantly limited due to overlying tubing and suction bulb as well as a large amount of soft tissue gas. No definite radiopaque needle is noted, although this may be obscured due to overlying artifact as described above. These results were called by telephone at the time of interpretation on 04/15/2021 at 4:10 pm to provider Dr. Johney Maine, who verbally acknowledged these results. Electronically Signed   By: Jeneen Rinks  Murlean Caller M.D.   On: 04/15/2021 16:11   DG CHEST PORT 1 VIEW  Result Date: 04/15/2021 CLINICAL DATA:  Intubation. EXAM: PORTABLE CHEST 1 VIEW COMPARISON:  Radiographs 04/12/2021 and 04/10/2021.  CT 04/10/2021. FINDINGS: 1713 hours. Tip of the endotracheal tube overlies the mid trachea. An enteric tube projects below the diaphragm into the right upper quadrant of the abdomen, likely in the distal stomach. Right arm PICC projects to the level of the superior SVC. There is mild patient rotation to the right and elevation of the right hemidiaphragm associated with increased right basilar subsegmental atelectasis. The lungs are otherwise clear. There is no pleural effusion  or pneumothorax. Suggested soft tissue emphysema within the abdominal flanks bilaterally. IMPRESSION: 1. Satisfactory position of the endotracheal and enteric tubes. 2. Right arm PICC appears to have been pulled back to the level of the upper SVC. 3. Suggested soft tissue emphysema within both abdominal flanks. Electronically Signed   By: Richardean Sale M.D.   On: 04/15/2021 19:26   ECHOCARDIOGRAM COMPLETE  Result Date: 04/14/2021    ECHOCARDIOGRAM REPORT   Patient Name:   Mikayla Rios Date of Exam: 04/14/2021 Medical Rec #:  [id number]      Height:       63.0 in Accession #:    [id number]     Weight:       200.0 lb Date of Birth:  Feb 10, 1986       BSA:          1.934 m Patient Age:    35 years       BP:           109/82 mmHg Patient Gender: F              HR:           133 bpm. Exam Location:  Inpatient Procedure: 2D Echo, Cardiac Doppler and Color Doppler Indications:    I47.2 Ventricular tachycardia  History:        Patient has prior history of Echocardiogram examinations, most                 recent 11/09/2019. Arrythmias:Prolonged QT interval; Risk                 Factors:Hypertension.  Sonographer:    Glo Herring Referring Phys: Theola Sequin IMPRESSIONS  1. Left ventricular ejection fraction, by estimation, is 60 to 65%. The left ventricle has normal function. The left ventricle has no regional wall motion abnormalities. Indeterminate diastolic filling due to E-A fusion.  2. Right ventricular systolic function is normal. The right ventricular size is normal.  3. The mitral valve is normal in structure. No evidence of mitral valve regurgitation. No evidence of mitral stenosis.  4. The aortic valve is normal in structure. Aortic valve regurgitation is not visualized. No aortic stenosis is present.  5. The inferior vena cava is normal in size with greater than 50% respiratory variability, suggesting right atrial pressure of 3 mmHg. FINDINGS  Left Ventricle: Left ventricular ejection fraction, by  estimation, is 60 to 65%. The left ventricle has normal function. The left ventricle has no regional wall motion abnormalities. The left ventricular internal cavity size was normal in size. There is  no left ventricular hypertrophy. Indeterminate diastolic filling due to E-A fusion. Right Ventricle: The right ventricular size is normal. No increase in right ventricular wall thickness. Right ventricular systolic function is normal. Left Atrium: Left atrial size was normal in size. Right Atrium:  Right atrial size was normal in size. Pericardium: There is no evidence of pericardial effusion. Mitral Valve: The mitral valve is normal in structure. No evidence of mitral valve regurgitation. No evidence of mitral valve stenosis. Tricuspid Valve: The tricuspid valve is normal in structure. Tricuspid valve regurgitation is not demonstrated. No evidence of tricuspid stenosis. Aortic Valve: The aortic valve is normal in structure. Aortic valve regurgitation is not visualized. No aortic stenosis is present. Aortic valve mean gradient measures 4.0 mmHg. Aortic valve peak gradient measures 8.6 mmHg. Pulmonic Valve: The pulmonic valve was normal in structure. Pulmonic valve regurgitation is not visualized. No evidence of pulmonic stenosis. Aorta: The aortic root is normal in size and structure. Venous: The inferior vena cava is normal in size with greater than 50% respiratory variability, suggesting right atrial pressure of 3 mmHg. IAS/Shunts: No atrial level shunt detected by color flow Doppler.  LEFT VENTRICLE PLAX 2D LVIDd:         3.80 cm LVIDs:         2.30 cm LV PW:         1.00 cm LV IVS:        1.00 cm  RIGHT VENTRICLE RV S prime:     21.90 cm/s LEFT ATRIUM             Index LA diam:        3.50 cm 1.81 cm/m LA Vol (A2C):   32.1 ml 16.60 ml/m LA Vol (A4C):   29.5 ml 15.26 ml/m LA Biplane Vol: 32.3 ml 16.70 ml/m  AORTIC VALVE                   PULMONIC VALVE AV Vmax:           147.00 cm/s PV Vmax:       1.21 m/s AV  Vmean:          96.600 cm/s PV Peak grad:  5.9 mmHg AV VTI:            0.168 m AV Peak Grad:      8.6 mmHg AV Mean Grad:      4.0 mmHg LVOT Vmax:         85.50 cm/s LVOT Vmean:        53.100 cm/s LVOT VTI:          0.097 m LVOT/AV VTI ratio: 0.58  AORTA Ao Root diam: 3.00 cm Ao Asc diam:  2.90 cm  SHUNTS Systemic VTI: 0.10 m Dani Gobble Croitoru MD Electronically signed by Sanda Klein MD Signature Date/Time: 04/14/2021/1:18:01 PM    Final     Anti-infectives: Anti-infectives (From admission, onward)    Start     Dose/Rate Route Frequency Ordered Stop   04/15/21 1000  anidulafungin (ERAXIS) 100 mg in sodium chloride 0.9 % 100 mL IVPB        100 mg 78 mL/hr over 100 Minutes Intravenous Every 24 hours 04/14/21 0744     04/15/21 0900  clindamycin (CLEOCIN) IVPB 900 mg       See Hyperspace for full Linked Orders Report.   900 mg 100 mL/hr over 30 Minutes Intravenous 60 min pre-op 04/14/21 1405 04/15/21 1142   04/15/21 0900  gentamicin (GARAMYCIN) 340 mg in dextrose 5 % 100 mL IVPB       See Hyperspace for full Linked Orders Report.   5 mg/kg  67.7 kg (Adjusted) 108.5 mL/hr over 60 Minutes Intravenous 60 min pre-op 04/14/21 1405 04/15/21 1834   04/14/21 1000  anidulafungin (ERAXIS) 200 mg in sodium chloride 0.9 % 200 mL IVPB        200 mg 78 mL/hr over 200 Minutes Intravenous  Once 04/14/21 0755 04/14/21 1500   04/13/21 0900  meropenem (MERREM) 1 g in sodium chloride 0.9 % 100 mL IVPB        1 g 200 mL/hr over 30 Minutes Intravenous Every 8 hours 04/13/21 0800     04/10/21 2200  cefTRIAXone (ROCEPHIN) 2 g in sodium chloride 0.9 % 100 mL IVPB  Status:  Discontinued        2 g 200 mL/hr over 30 Minutes Intravenous Daily at bedtime 04/10/21 2044 04/13/21 0746   04/10/21 2200  metroNIDAZOLE (FLAGYL) IVPB 500 mg  Status:  Discontinued        500 mg 100 mL/hr over 60 Minutes Intravenous Every 12 hours 04/10/21 2044 04/13/21 0745        Assessment/Plan Complicated chronic diverticulitis with RP  abscess, refractory to non-operative management. Cannot exclude perforated colon mass.  - Originally admitted from 5/31-6/6 with perforated diverticulitis with an abscess.  Underwent CT guided drain placement x2 on 6/3 and was discharged on cefdinir and flagyl. She followed up with Dr. Marcello Moores in our office and has had multiple drain exchanges/upsizes since this time without resolution. Patient was referred for colonoscopy but never got one prior to this admission.   S/P LAPAROSCOPIC ASSISTED HARTMANN  (LEFT COLECTOMY / END COLOSTOMY), SMALL BOWEL RESECTION, LEFT RETROPERITONEAL / FLANK ABSCESS DRAINAGE 04/15/21 Dr. [name]  - POD#1, afebrile, sinus tachycardia, WBC 28  - follow surgical path - continue NG to LIWS and await further bowel function  - would start TPN today, inconsistent PO intake prior to surgery, significant operation, high risk for post-op ileus, prealbumin is 7.3 - VAC change M/W/F - WOC RN consulted for new ostomy  - PT/OT once extubated  FEN - NPO, IVF, NG to LIWS, start TPN; no stool softeners/laxatives at this time please VTE - SCDs, Lovenox ID - Merrem, Eraxis  Acute respiratory failure with hypoxia - weaning to extubate this AM per CCM. Appreciate CCM assistance and care. Septic shock - improving, no signs of end organ damage. UOP 895 cc/creatinine remains normal. Weaning pressor support this AM per CCM.  Pyuria - UCx w/ E. Coli Syncope - TRH working up; echo 11/9 LVEF 60-65%   LOS: 6 days    Jill Alexanders , [facility] Surgery 04/16/2021, 8:44 AM Please see Amion for pager number during day hours 7:00am-4:30pm

## 2021-04-16 NOTE — Progress Notes (Signed)
PT Cancellation Note  Patient Details Name: Mikayla Rios MRN: [id number] DOB: Jul 26, 1985   Cancelled Treatment:     PT deferred this date.  Pt currently on vent.  Will follow.   Ereka Brau 04/16/2021, 7:19 AM

## 2021-04-16 NOTE — Consult Note (Addendum)
WOC ostomy follow up Pt had colostomy surgery performed yesterday.   [place] team will perform first post-op pouch change and teaching session with Pt and husband tomorrow morning.  Discussed via secure chat with patient's bedside nurse, and the patient requested I come to see them at 10:00 on Fri morning. Julien Girt MSN, RN, [place], [place], [place]

## 2021-04-16 NOTE — Progress Notes (Signed)
At approximately  0000, patient's Aline was alarming a low systolic in the 09'Q.   Aline was assessed, levelled and flushed per protocol.   Levophed was titrated appropriately to meet hemodynamic goals.   Vasopressin was restarted to support map/bp goals.   No evidence of bleeding. Urine output and J/P drain documented appropriately.   Will continue to monitor.

## 2021-04-16 NOTE — Progress Notes (Addendum)
PHARMACY - TOTAL PARENTERAL NUTRITION CONSULT NOTE   Indication:  intolerance to enteral feeding  Patient Measurements: Height: 5\' 3"  (160 cm) Weight: 99.5 kg (219 lb 5.7 oz) IBW/kg (Calculated) : 52.4 TPN AdjBW (KG): 62 Body mass index is 38.86 kg/m. Usual Weight: 90.7  Assessment: 35 yo F s/p LAPAROSCOPIC ASSISTED HARTMANN  (LEFT COLECTOMY / END COLOSTOMY), SMALL BOWEL RESECTION, LEFT RETROPERITONEAL / FLANK ABSCESS DRAINAGE 04/15/21 Dr. [name] to start TPN per pharmacy for nutrition support.  PMH:  hypertension, inappropriate sinus tachycardia, obesity, obstructive sleep apnea and diverticulosis with complicated diverticulitis and abscess formation 11/2020 status post IR guided placement of 2 left lower quadrant drains  Glucose / Insulin: no hx DM; A1c 4.6; CBGs < 150, serum glu 290- ( ? Contaminated lab) Electrolytes: Ns 132, K 3.8, Mag 1.7 after 4 gm yesterday; Phos 4.3 hx prolonged QTC- will aim for K =>4 and Mg =>2; Renal:  WNL Hepatic: WNL Intake / Output; MIVF: LR @ 75 ml/hr per CCS UOP 895 ml/24 hrs Drains 740 mls/24 hrs, NG 300 ml/24 hrs GI Imaging: GI Surgeries / Procedures:  11/9 LAPAROSCOPIC ASSISTED HARTMANN  (LEFT COLECTOMY / END COLOSTOMY), SMALL BOWEL RESECTION, LEFT RETROPERITONEAL / FLANK ABSCESS DRAINAGE 04/15/21 Dr. [name]  Central access: CL PICC placed 04/12/21 TPN start date: 04/16/21  Nutritional Goals:  Goal TPN rate is 90 mL/hr (provides 108 g of protein and 1966 kcals per day)  RD Assessment: 04/13/21 RD note Estimated Needs Total Energy Estimated Needs: 1900-2100 kcal Total Protein Estimated Needs: 95-110 grams Total Fluid Estimated Needs: >/= 2.2 L/day  Current Nutrition:  NPO  Plan:  Now: 2 gm Mag & K 2 runs;  Start TPN at 40 mL/hr at 1800 Electrolytes in TPN: Na 76mEq/L, K 52mEq/L, Ca 56mEq/L, Mg 59mEq/L, and Phos 83mmol/L. Cl:Ac 1:1 Add standard MVI and trace elements to TPN Initiate Sensitive q6h SSI and adjust as needed  MIVF  dc'd by CCM 11/10 am Monitor TPN labs on Mon/Thurs, CMET, Mg & Phos in AM  Eudelia Bunch, Pharm.D 04/16/2021 10:40 AM

## 2021-04-16 NOTE — Progress Notes (Signed)
OT Cancellation Note  Patient Details Name: Mikayla Rios MRN: [id number] DOB: 01-Jun-1986   Cancelled Treatment:    Reason Eval/Treat Not Completed: Patient not medically ready. Patient continues to be intubated and on vent s/p surgery. Will continue to follow acutely.  [name] [name] 04/16/2021, 7:17 AM

## 2021-04-16 NOTE — Procedures (Signed)
Extubation Procedure Note  Patient Details:   Name: Mikayla Rios DOB: 07-17-1985 MRN: [id number]   Airway Documentation:    Vent end date: 04/16/21 Vent end time: 1158   Evaluation  O2 sats: stable throughout Complications: No apparent complications Patient did tolerate procedure well. Bilateral Breath Sounds: Clear   Yes  Pt extubated to 3L [name] per CCMD order with no complications noted. Pt was suctioned and had a positive cuff leak prior to extubation. No stridor is heard at this time, and pt was able to speak afterwards.  Hiran Leard A Obinna Ehresman 04/16/2021, 11:59 AM

## 2021-04-17 ENCOUNTER — Encounter (HOSPITAL_COMMUNITY): Payer: Self-pay

## 2021-04-17 ENCOUNTER — Inpatient Hospital Stay: Payer: Self-pay

## 2021-04-17 DIAGNOSIS — R52 Pain, unspecified: Secondary | ICD-10-CM | POA: Diagnosis not present

## 2021-04-17 DIAGNOSIS — D649 Anemia, unspecified: Secondary | ICD-10-CM

## 2021-04-17 DIAGNOSIS — E43 Unspecified severe protein-calorie malnutrition: Secondary | ICD-10-CM | POA: Diagnosis not present

## 2021-04-17 DIAGNOSIS — K5720 Diverticulitis of large intestine with perforation and abscess without bleeding: Secondary | ICD-10-CM | POA: Diagnosis not present

## 2021-04-17 LAB — COMPREHENSIVE METABOLIC PANEL
ALT: 9 U/L (ref 0–44)
AST: 13 U/L — ABNORMAL LOW (ref 15–41)
Albumin: 2 g/dL — ABNORMAL LOW (ref 3.5–5.0)
Alkaline Phosphatase: 51 U/L (ref 38–126)
Anion gap: 6 (ref 5–15)
BUN: 16 mg/dL (ref 6–20)
CO2: 26 mmol/L (ref 22–32)
Calcium: 7.6 mg/dL — ABNORMAL LOW (ref 8.9–10.3)
Chloride: 105 mmol/L (ref 98–111)
Creatinine, Ser: 0.53 mg/dL (ref 0.44–1.00)
GFR, Estimated: >60 mL/min (ref >60)
Glucose, Bld: 114 mg/dL — ABNORMAL HIGH (ref 70–99)
Potassium: 3.5 mmol/L (ref 3.5–5.1)
Sodium: 137 mmol/L (ref 135–145)
Total Bilirubin: 0.5 mg/dL (ref 0.3–1.2)
Total Protein: 4.2 g/dL — ABNORMAL LOW (ref 6.5–8.1)

## 2021-04-17 LAB — HEMOGLOBIN AND HEMATOCRIT, BLOOD
HCT: 21.4 % — ABNORMAL LOW (ref 36.0–46.0)
Hemoglobin: 7.3 g/dL — ABNORMAL LOW (ref 12.0–15.0)

## 2021-04-17 LAB — CBC
HCT: 19.4 % — ABNORMAL LOW (ref 36.0–46.0)
Hemoglobin: 6.4 g/dL — CL (ref 12.0–15.0)
MCH: 30.5 pg (ref 26.0–34.0)
MCHC: 33 g/dL (ref 30.0–36.0)
MCV: 92.4 fL (ref 80.0–100.0)
Platelets: 263 10*3/uL (ref 150–400)
RBC: 2.1 MIL/uL — ABNORMAL LOW (ref 3.87–5.11)
RDW: 18.2 % — ABNORMAL HIGH (ref 11.5–15.5)
WBC: 16.6 10*3/uL — ABNORMAL HIGH (ref 4.0–10.5)
nRBC: 0 % (ref 0.0–0.2)

## 2021-04-17 LAB — MAGNESIUM: Magnesium: 2.2 mg/dL (ref 1.7–2.4)

## 2021-04-17 LAB — GLUCOSE, CAPILLARY
Glucose-Capillary: 100 mg/dL — ABNORMAL HIGH (ref 70–99)
Glucose-Capillary: 100 mg/dL — ABNORMAL HIGH (ref 70–99)
Glucose-Capillary: 124 mg/dL — ABNORMAL HIGH (ref 70–99)
Glucose-Capillary: 134 mg/dL — ABNORMAL HIGH (ref 70–99)
Glucose-Capillary: 81 mg/dL (ref 70–99)
Glucose-Capillary: 86 mg/dL (ref 70–99)
Glucose-Capillary: 92 mg/dL (ref 70–99)

## 2021-04-17 LAB — TRIGLYCERIDES: Triglycerides: 123 mg/dL (ref <150)

## 2021-04-17 LAB — PHOSPHORUS: Phosphorus: 3.4 mg/dL (ref 2.5–4.6)

## 2021-04-17 LAB — PREPARE RBC (CROSSMATCH)

## 2021-04-17 MED ORDER — TRAVASOL 10 % IV SOLN
INTRAVENOUS | Status: AC
  Filled 2021-04-17: qty 1080

## 2021-04-17 MED ORDER — METRONIDAZOLE 500 MG/100ML IV SOLN
500.0000 mg | Freq: Two times a day (BID) | INTRAVENOUS | Status: DC
  Administered 2021-04-17 – 2021-04-21 (×8): 500 mg via INTRAVENOUS
  Filled 2021-04-17 (×8): qty 100

## 2021-04-17 MED ORDER — HYDROMORPHONE HCL 1 MG/ML IJ SOLN
0.5000 mg | INTRAMUSCULAR | Status: DC | PRN
Start: 2021-04-17 — End: 2021-04-19
  Administered 2021-04-17 – 2021-04-19 (×7): 1 mg via INTRAVENOUS
  Filled 2021-04-17 (×7): qty 1

## 2021-04-17 MED ORDER — DEXTROSE 10 % IV SOLN: INTRAVENOUS | Status: AC

## 2021-04-17 MED ORDER — SODIUM CHLORIDE 0.9% IV SOLUTION: Freq: Once | INTRAVENOUS | Status: AC

## 2021-04-17 MED ORDER — KETOROLAC TROMETHAMINE 15 MG/ML IJ SOLN
15.0000 mg | Freq: Four times a day (QID) | INTRAMUSCULAR | Status: DC | PRN
  Administered 2021-04-17 – 2021-04-18 (×3): 15 mg via INTRAVENOUS
  Filled 2021-04-17 (×3): qty 1

## 2021-04-17 MED ORDER — ACETAMINOPHEN 10 MG/ML IV SOLN
1000.0000 mg | Freq: Four times a day (QID) | INTRAVENOUS | Status: AC
  Administered 2021-04-17 – 2021-04-18 (×4): 1000 mg via INTRAVENOUS
  Filled 2021-04-17 (×4): qty 100

## 2021-04-17 MED ORDER — SODIUM CHLORIDE 0.9 % IV SOLN
2.0000 g | Freq: Three times a day (TID) | INTRAVENOUS | Status: DC
  Administered 2021-04-17 – 2021-04-21 (×11): 2 g via INTRAVENOUS
  Filled 2021-04-17 (×11): qty 2

## 2021-04-17 MED ORDER — POTASSIUM CHLORIDE 10 MEQ/50ML IV SOLN
10.0000 meq | INTRAVENOUS | Status: AC
  Administered 2021-04-17 (×2): 10 meq via INTRAVENOUS
  Filled 2021-04-17 (×2): qty 50

## 2021-04-17 MED ORDER — ENOXAPARIN SODIUM 40 MG/0.4ML IJ SOSY: 40.0000 mg | PREFILLED_SYRINGE | INTRAMUSCULAR | Status: DC

## 2021-04-17 NOTE — Progress Notes (Signed)
Peripherally Inserted Central Catheter Placement  The IV Nurse has discussed with the patient and/or persons authorized to consent for the patient, the purpose of this procedure and the potential benefits and risks involved with this procedure.  The benefits include less needle sticks, lab draws from the catheter, and the patient may be discharged home with the catheter. Risks include, but not limited to, infection, bleeding, blood clot (thrombus formation), and puncture of an artery; nerve damage and irregular heartbeat and possibility to perform a PICC exchange if needed/ordered by physician.  Alternatives to this procedure were also discussed.  Bard Power PICC patient education guide, fact sheet on infection prevention and patient information card has been provided to patient /or left at bedside.    PICC Placement Documentation  PICC Triple Lumen 04/17/21 PICC Right Brachial 38 cm 0 cm (Active)  Indication for Insertion or Continuance of Line Administration of hyperosmolar/irritating solutions (i.e. TPN, Vancomycin, etc.) 04/17/21 1031  Exposed Catheter (cm) 0 cm 04/17/21 1031  Site Assessment Clean;Dry;Intact 04/17/21 1031  Lumen #1 Status Flushed;Saline locked;Blood return noted 04/17/21 1031  Lumen #2 Status Flushed;Saline locked;Blood return noted 04/17/21 1031  Lumen #3 Status Flushed;Saline locked;Blood return noted 04/17/21 1031  Dressing Type Transparent;Securing device 04/17/21 1031  Dressing Status Clean;Dry;Intact 04/17/21 1031  Antimicrobial disc in place? Yes 04/17/21 1031  Safety Lock Not Applicable 24/40/10 2725  Dressing Intervention Other (Comment) 04/17/21 1031  Dressing Change Due 04/24/21 04/17/21 1031    PICC exchange done due to malposition of PICC tip. TPN stopped and taken down for the procedure. RN to hang D10W per protocol. RN at bedside at this time. Will monitor.   Enos Fling 04/17/2021, 10:33 AM

## 2021-04-17 NOTE — Addendum Note (Signed)
Addendum  created 04/17/21 0656 by West Pugh, CRNA   Intraprocedure Event edited

## 2021-04-17 NOTE — Progress Notes (Signed)
Nutrition Follow-up  DOCUMENTATION CODES:   Obesity unspecified  INTERVENTION:  - continue TPN orders per Pharmacist. - diet advancement as medically feasible.   NUTRITION DIAGNOSIS:   Inadequate oral intake related to acute illness, inability to eat as evidenced by per patient/family report, NPO status. -ongoing  GOAL:   Patient will meet greater than or equal to 90% of their needs -unmet currently, to be met with TPN regimen  MONITOR:   Diet advancement, Labs, Weight trends, I & O's, Other (Comment) (TPN regimen)  REASON FOR ASSESSMENT:   Consult New TPN/TNA  ASSESSMENT:   35 year old female with medical history of HTN, sinus tachycardia, obesity, anemia, OSA, and diverticulosis with complicated diverticulitis and abscess formation 11/2020 s/p IR guided placement of 2 left lower quadrant drains. She presented to the ED due to progressive generalized weakness x2 months and lightheadedness. She also reported poor oral intake PTA and gradual weight loss. She was seen by a Gastroenterologist on 10/6 with plan to undergo endoscopic work-up to rule out malignancy.  Patient discussed in rounds this AM. She underwent PICC exchange today and now has a triple lumen in R brachial.   Diet advanced from NPO to Regular on 11/5 at 1604, changed back to NPO on 11/7 at 1014, back to Regular on 11/7 at 1733, down graded to CLD on 11/8 at 0945, and back to NPO at midnight on 11/9. She has remained NPO since that time.   Meal intakes while on CLD and Regular diet were mainly 50%,   NGT placed on 11/9 at 1444 and there is minimal output in canister at the time of RD visit earlier this afternoon.   She is POD #2 lap-assisted hartmann (L colectomy and end colostomy), SBR, L retroperitoneal/flank abscess drainage.   She was unable to receive TPN starting yesterday evening d/t malpositioned PICC. Plan for today at 1800 is to advance TPN rate to 90 ml/hr which will provide 1966 kcal and 108 grams  protein.   Patient laying in bed with husband at bedside. Patient drowsy during visit but conversant and able to provide most of information. Her husband also assisted with providing information.   Patient shares that she was previously >300 lb and that she and husband tried many different diets in an effort to lose weight. They also began walking more for exercise to aid in weight loss goal. They found that keto diet was most effective for them.   She shares that she was doing well with weight loss goal but that in May 2022 they both had COVID. During that time she was having N/V/D and difficulty keeping anything down. Even after recovering from [place] patient continued to feel unwell and have ongoing medical decline.   She shares that since May or June she has continued to unintentionally lose weight, have a decrease in PO intakes, and that she has become so weak that she is unable to walk unless her husband assists her and she needed to sleep in the front room of their home d/t inability to make it up the steps to their bed.    Labs reviewed; CBGs: 100, 81, 100, 92 mg/dl, Ca: 7.6 mg/dl. Medications reviewed; sliding scale novolog, 40 mg IV protonix/day, 10 mEq IV KCl x2 runs 11/10 and x2 runs 11/11.  IVF; D10 @ 40 ml/hr (326 kcal/24 hrs).     NUTRITION - FOCUSED PHYSICAL EXAM:  Completed; no muscle or fat depletions, mild edema to BLE.   Diet Order:   Diet Order  Diet NPO time specified Except for: Ice Chips  Diet effective now                   EDUCATION NEEDS:   Education needs have been addressed  Skin:  Skin Assessment: Skin Integrity Issues: Skin Integrity Issues:: Other (Comment), Incisions, Wound VAC Wound Vac: abdomen Incisions: abdomen (11/9) Other: open pressure sore vs MASD to L buttocks  Last BM:  11/11 (type 6 x1)  Height:   Ht Readings from Last 1 Encounters:  04/15/21 5' 3"  (1.6 m)    Weight:   Wt Readings from Last 1 Encounters:   04/15/21 99.5 kg     Estimated Nutritional Needs:  Kcal:  2150-2400 kcal Protein:  110-125 grams Fluid:  >/= 2.2 L/day       Jarome Matin, MS, RD, LDN, CNSC Inpatient Clinical Dietitian RD pager # available in [place]  After hours/weekend pager # available in [facility]

## 2021-04-17 NOTE — Progress Notes (Signed)
Per xray, PICC tip not in optimal position. Waiting for PICC exchange order.

## 2021-04-17 NOTE — Addendum Note (Signed)
Addendum  created 04/17/21 0705 by Montel Clock, CRNA   Intraprocedure Event deleted

## 2021-04-17 NOTE — Progress Notes (Addendum)
2 Days Post-Op  Subjective: CC: Awake, alert, sitting up. Reports mild abd pain worse with movement, improved by pain meds. Has not bed OOB yet but states she plans to work with PT today. RN at bedside, on the phone regarding PICC exchange.   Objective: Vital signs in last 24 hours: Temp:  [97.5 F (36.4 C)-98.5 F (36.9 C)] 98.5 F (36.9 C) (11/11 1101) Pulse Rate:  [86-125] 117 (11/11 1101) Resp:  [14-22] 20 (11/11 1101) BP: (95-134)/(52-78) 129/76 (11/11 0500) SpO2:  [100 %] 100 % (11/11 1101) Arterial Line BP: (94-124)/(57-73) 105/66 (11/11 1101) Last BM Date: 04/17/21  Intake/Output from previous day: 11/10 0701 - 11/11 0700 In: 2848.4 [I.V.:1079.1; IV Piggyback:1709.3] Out: 1695 [Urine:870; Emesis/NG output:350; Drains:360; Stool:115] Intake/Output this shift: Total I/O In: 465.4 [I.V.:230.5; Other:10; IV Piggyback:224.8] Out: 450 [Urine:135; Drains:240; Stool:75]  PE: Gen:  Alert, cooperative Card: sinus tachycardia, up to 143 bpm during my exam Pulm:  ventilated respirations, CTAB Abd: Soft, obese, appropriately tender, VAC to midline wound, JP in RLQ SS, LUQ stoma is edematous and viable- SS drainage in ostomy pouch, no gas/stool  NG - 350 cc/24h  RLQ Blake drain (in L retroperitoneal space) - 360 cc/24h ss   Psych: A&Ox3  Lab Results:  Recent Labs    04/16/21 0500 04/17/21 0500  WBC 28.1* 16.6*  HGB 9.2* 6.4*  HCT 26.8* 19.4*  PLT 372 263   BMET Recent Labs    04/16/21 0500 04/17/21 0500  NA 132* 137  K 3.8 3.5  CL 101 105  CO2 23 26  GLUCOSE 290* 114*  BUN 12 16  CREATININE 0.59 0.53  CALCIUM 7.2* 7.6*   PT/INR No results for input(s): LABPROT, INR in the last 72 hours. CMP     Component Value Date/Time   NA 137 04/17/2021 0500   NA 139 12/21/2019 1024   K 3.5 04/17/2021 0500   CL 105 04/17/2021 0500   CO2 26 04/17/2021 0500   GLUCOSE 114 (H) 04/17/2021 0500   BUN 16 04/17/2021 0500   BUN 13 12/21/2019 1024   CREATININE 0.53  04/17/2021 0500   CALCIUM 7.6 (L) 04/17/2021 0500   PROT 4.2 (L) 04/17/2021 0500   PROT 7.4 07/11/2019 1638   ALBUMIN 2.0 (L) 04/17/2021 0500   ALBUMIN 4.6 07/11/2019 1638   AST 13 (L) 04/17/2021 0500   ALT 9 04/17/2021 0500   ALKPHOS 51 04/17/2021 0500   BILITOT 0.5 04/17/2021 0500   BILITOT 0.2 07/11/2019 1638   GFRNONAA >60 04/17/2021 0500   GFRAA 96 12/21/2019 1024   Lipase     Component Value Date/Time   LIPASE 21 11/04/2020 1119    Studies/Results: DG Abd 1 View  Result Date: 04/15/2021 CLINICAL DATA:  Incorrect needle count. EXAM: ABDOMEN - 1 VIEW COMPARISON:  None. FINDINGS: There is an extensive amount of overlying tubing and suction bulb which may obscure a small needle. Large amount of soft tissue gas is noted. This may also obscure needle. No definite evidence of surgical needle is noted. IMPRESSION: Exam is significantly limited due to overlying tubing and suction bulb as well as a large amount of soft tissue gas. No definite radiopaque needle is noted, although this may be obscured due to overlying artifact as described above. These results were called by telephone at the time of interpretation on 04/15/2021 at 4:10 pm to provider Dr. Johney Maine, who verbally acknowledged these results. Electronically Signed   By: Marijo Conception M.D.   On: 04/15/2021  16:11   DG CHEST PORT 1 VIEW  Result Date: 04/16/2021 CLINICAL DATA:  Respiratory failure EXAM: PORTABLE CHEST 1 VIEW COMPARISON:  Chest x-ray 04/15/2021 FINDINGS: Endotracheal tube tip is 2.7 cm above the carina. Enteric tube tip is in the distal stomach region. Right PICC tip is near the superior aspect of the SVC. Heart size and mediastinum are within normal limits. No consolidation or edema. No pleural effusion or pneumothorax. Small subcutaneous emphysema bilaterally appears similar to slightly decreased since previous study. IMPRESSION: Medical devices as described.  No acute process identified. Electronically Signed   By:  Ofilia Neas M.D.   On: 04/16/2021 11:46   DG CHEST PORT 1 VIEW  Result Date: 04/15/2021 CLINICAL DATA:  Intubation. EXAM: PORTABLE CHEST 1 VIEW COMPARISON:  Radiographs 04/12/2021 and 04/10/2021.  CT 04/10/2021. FINDINGS: 1713 hours. Tip of the endotracheal tube overlies the mid trachea. An enteric tube projects below the diaphragm into the right upper quadrant of the abdomen, likely in the distal stomach. Right arm PICC projects to the level of the superior SVC. There is mild patient rotation to the right and elevation of the right hemidiaphragm associated with increased right basilar subsegmental atelectasis. The lungs are otherwise clear. There is no pleural effusion or pneumothorax. Suggested soft tissue emphysema within the abdominal flanks bilaterally. IMPRESSION: 1. Satisfactory position of the endotracheal and enteric tubes. 2. Right arm PICC appears to have been pulled back to the level of the upper SVC. 3. Suggested soft tissue emphysema within both abdominal flanks. Electronically Signed   By: Richardean Sale M.D.   On: 04/15/2021 19:26   [place] EKG Site Rite  Result Date: 04/17/2021 If Site Rite image not attached, placement could not be confirmed due to current cardiac rhythm.   Anti-infectives: Anti-infectives (From admission, onward)    Start     Dose/Rate Route Frequency Ordered Stop   04/15/21 1000  anidulafungin (ERAXIS) 100 mg in sodium chloride 0.9 % 100 mL IVPB        100 mg 78 mL/hr over 100 Minutes Intravenous Every 24 hours 04/14/21 0744     04/15/21 0900  clindamycin (CLEOCIN) IVPB 900 mg       See Hyperspace for full Linked Orders Report.   900 mg 100 mL/hr over 30 Minutes Intravenous 60 min pre-op 04/14/21 1405 04/15/21 1142   04/15/21 0900  gentamicin (GARAMYCIN) 340 mg in dextrose 5 % 100 mL IVPB       See Hyperspace for full Linked Orders Report.   5 mg/kg  67.7 kg (Adjusted) 108.5 mL/hr over 60 Minutes Intravenous 60 min pre-op 04/14/21 1405 04/15/21 1834    04/14/21 1000  anidulafungin (ERAXIS) 200 mg in sodium chloride 0.9 % 200 mL IVPB        200 mg 78 mL/hr over 200 Minutes Intravenous  Once 04/14/21 0755 04/14/21 1500   04/13/21 0900  meropenem (MERREM) 1 g in sodium chloride 0.9 % 100 mL IVPB        1 g 200 mL/hr over 30 Minutes Intravenous Every 8 hours 04/13/21 0800     04/10/21 2200  cefTRIAXone (ROCEPHIN) 2 g in sodium chloride 0.9 % 100 mL IVPB  Status:  Discontinued        2 g 200 mL/hr over 30 Minutes Intravenous Daily at bedtime 04/10/21 2044 04/13/21 0746   04/10/21 2200  metroNIDAZOLE (FLAGYL) IVPB 500 mg  Status:  Discontinued        500 mg 100 mL/hr over 60 Minutes Intravenous  Every 12 hours 04/10/21 2044 04/13/21 0745        Assessment/Plan Complicated chronic diverticulitis 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 Cannot exclude perforated colon mass.  - Originally admitted from 5/31-6/6 with perforated diverticulitis with an abscess.  Underwent CT guided drain placement x2 on 6/3 and was discharged on cefdinir and flagyl. She followed up with Dr. Marcello Moores in our office and has had multiple drain exchanges/upsizes since this time without resolution. Patient was referred for colonoscopy but never got one prior to this admission.   S/P LAPAROSCOPIC ASSISTED HARTMANN  (LEFT COLECTOMY / END COLOSTOMY), SMALL BOWEL RESECTION, LEFT RETROPERITONEAL / FLANK ABSCESS DRAINAGE 04/15/21 Dr. [name]  - POD#2, afebrile, sinus tachycardia , WBC 16 from 28  - follow surgical path - continue NG to LIWS and await further bowel function  - VAC change M/W/F - WOC RN following for ostomy education and pouch was changed today 11/11 - PT/OT after transfusion  FEN - NPO, IVF, NG to LIWS, TPN, prealbumin is 7.3 VTE - SCDs, Lovenox ID - Merrem, Eraxis  ABL anemia - hgb 6.4 this AM, 1 u pRBC to be transfused after PICC exchange  Acute respiratory failure with hypoxia - extubated 11/11  CCM assistance and care. Septic shock  - resolved, no signs of end organ damage. Off pressors. Good UOP. Pyuria - UCx w/ E. Coli Syncope - TRH working up; echo 11/9 LVEF 60-65%   LOS: 7 days    [facility] 04/17/2021, 12:15 PM Please see Amion for pager number during day hours 7:00am-4:30pm

## 2021-04-17 NOTE — Progress Notes (Signed)
PHARMACY - TOTAL PARENTERAL NUTRITION CONSULT NOTE   Indication:  intolerance to enteral feeding  Patient Measurements: Height: 5\' 3"  (160 cm) Weight: 99.5 kg (219 lb 5.7 oz) IBW/kg (Calculated) : 52.4 TPN AdjBW (KG): 62 Body mass index is 38.86 kg/m. Usual Weight: 90.7  Assessment: 35 yo F s/p laparoscopic assisted Hartmann (left colectomy / end colostomy), small bowel resection, left retroperitoneal / flank abscess drainage on 04/15/21.   Start TPN per pharmacy for nutrition support.  PMH:  hypertension, inappropriate sinus tachycardia, obesity, obstructive sleep apnea and diverticulosis with complicated diverticulitis and abscess formation 11/2020 status post IR guided placement of 2 left lower quadrant drains  Glucose / Insulin: no hx DM; A1c 4.6; no PTA medications. - CBGs 81-124 (goal 150-180) - Dexamethasone 10mg  IV on 11/9 - SSI 1 unit / 24 hrs Electrolytes: K 3.5, others WNL, CorrCa 9.2 - hx prolonged QTC- will aim for K =>4 and Mg =>2 Renal:  WNL Hepatic: WNL Intake / Output; MIVF: UOP down to 870 ml/24 hrs  - Drains 360 mls/24 hrs, NG 350 ml/24 hrs, Stool 115 mL/24 hrs - Transfuse PRBC on 11/11.  No mIVF.  GI Imaging: GI Surgeries / Procedures:  11/9 Lap Hartmann (left colectomy / end colostomy), small bowel resection, left retroperitoneal / flank abscess drainage  Central access: PICC replaced 11/11 TPN start date: 04/16/21  Nutritional Goals:  Goal TPN rate is 90 mL/hr (provides 108 g of protein and 1966 kcals per day)  RD Assessment: 04/13/21 RD note Estimated Needs Total Energy Estimated Needs: 1900-2100 kcal Total Protein Estimated Needs: 95-110 grams Total Fluid Estimated Needs: >/= 2.2 L/day  Current Nutrition:  NPO TPN held on 11/11 d/t malpositioned PICC  Plan:  Now:  KCl 10 mEq IV x 2 runs per MD D10 infusion at 40 ml/hr while TPN off  At 18:00 Advance TPN to 90 mL/hr at 1800 Electrolytes in TPN: Na 9mEq/L, K 38mEq/L, Ca 93mEq/L, Mg 25mEq/L, and  Phos 45mmol/L. Cl:Ac 1:1 Add standard MVI and trace elements to TPN Initiate Sensitive q6h SSI and adjust as needed  IVF per MD (none) Monitor TPN labs on Mon/Thurs, BMET, Mg & Phos in AM  Gretta Arab PharmD, [facility] main pharmacy [phone] 04/17/2021 7:55 AM

## 2021-04-17 NOTE — Progress Notes (Signed)
While attempting to draw the type and screen, lines were flushed, caps exchanged no blood return noted.   IV consult was ordered.

## 2021-04-17 NOTE — Consult Note (Addendum)
[facility] Nurse Consult follow-up Note: Reason for Consult: Pt is followed by the surgical team for post-op plan of care.  Vac dressing changed to midline abd.  Pt medicated for pain prior to the procedure and tolerated with mod amt discomfort.  Wound type: Full thickness midline post-op wound Measurement:9X3X5cm Wound XJD:BZMCE red Drainage (amount, consistency, odor) minimal amt pink drainage Periwound: intact skin surrounding Dressing procedure/placement/frequency: Applied one piece black faom to 127mm cont suction.  WOC will plan to change dressing again on Mon.  WOC Nurse ostomy follow up Pt had colostomy surgery performed on 11/9 Stoma type/location: Stoma is red and viable, above skin level, 2 inches.  Current pouch is leaking behind the barrier. Peristomal assessment: small amt bloody drainage from suture line surrounding the stoma edges. Output: no stool or flatus, small amt blood-tinged drainage in the pouch Ostomy pouching: 2pc.  Education provided: Demonstrated pouch change using barrier ring to attempt to maintain a seal and 2 piece pouching system. Pt watched using a hand-held mirror and husband was at the bedside.  Pt is feeling poorly while in ICU with NG and did not assist today.  Husband was able to apply barrier ring and pouch and open and close the velcro to empty.  Reviewed pouching routines and ordering supplies.  Educational materials and 3 extra sets of pouching supplies left at bedside. Use supplies: barrier ring, Lawson # [phone], wafer Pearline Cables, pouch Lawson # [phone]. Enrolled patient in [place] Start Discharge program: Yes WOC will perform another pouching change and teaching session on Mon. Thank-you,  Julien Girt MSN, [place], [place], [place], [place]

## 2021-04-17 NOTE — Progress Notes (Signed)
OT Cancellation Note  Patient Details Name: Mikayla Rios MRN: [id number] DOB: 1986-02-05   Cancelled Treatment:    Reason Eval/Treat Not Completed: Medical issues which prohibited therapy.  Pt with Hbg 6.4 and has not yet received blood.    Mikayla Rios., OTR/L Acute Rehabilitation Services Pager [phone] Office [phone]   Lucille Passy M 04/17/2021, 11:57 AM

## 2021-04-17 NOTE — Progress Notes (Signed)
Inpatient Rehab Admissions Coordinator:    Pt. Is not medically ready for CIR today but I will follow for potential admit pending medical readiness, insurance auth, and bed availability.  Clemens Catholic, [place], [place] Admissions Coordinator  [phone] ([facility]) [phone] (office)

## 2021-04-17 NOTE — Progress Notes (Signed)
[facility] Progress Note Patient Name: MEIRA WAHBA DOB: Jun 29, 1985 MRN: [id number]   Date of Service  04/17/2021  HPI/Events of Note  Patient with post-operative acute blood loss anemia following exploratory laparotomy for diverticulitis induced intra-abdominal abscess.  eICU Interventions  Transfuse 1 unit PRBC.        Kerry Kass Keymarion Bearman 04/17/2021, 5:47 AM

## 2021-04-17 NOTE — Progress Notes (Signed)
Overnight, patient's PICC line stopped giving blood return through both lumens. Caps had been replaced and repositioning was attempted after flushing. Since Xray showed slight movement of PICC line, IV team RN recommended PICC exchange before administering ordered blood products, and antibiotics. CCM was notified by this RN, and orders received for PICC exchange. Pt is now receiving ordered unit of PRBCs through new PICC line. This RN will continue to carefully monitor pt.

## 2021-04-17 NOTE — Progress Notes (Signed)
Physical Therapy Treatment Patient Details Name: Mikayla Rios MRN: [id number] DOB: 11-02-1985 Today's Date: 04/17/2021   History of Present Illness Mikayla Rios is a 35 yo female presents with complaints of generalized weakness and lightheadedness. PMH: HTN, sinus tachny, obesity, obstructive sleep apnea and diverticulosis with complicated diverticulitis and abscess formation 11/2020 status post IR guided placement of 2 left lower quadrant drains.  S/PP LAPAROSCOPIC ASSISTED HARTMANN  (LEFT COLECTOMY / END COLOSTOMY), SMALL BOWEL RESECTION, LEFT RETROPERITONEAL / [place] ABSCESS DRAINAGE 04/15/21    PT Comments    Patient reporting frequent cramping. Deferred mobility but encouraged to have bed placed in chair position by nursing.  Patient performed active/assisted  exercises for LE's. Had patient self assist with sheet on each foot to support the leg during exercises.  Continue mobility as tolerated.  Recommendations for follow up therapy are one component of a multi-disciplinary discharge planning process, led by the attending physician.  Recommendations may be updated based on patient status, additional functional criteria and insurance authorization.  Follow Up Recommendations  Acute inpatient rehab (3hours/day)     Assistance Recommended at Discharge Frequent or constant Supervision/Assistance  Equipment Recommendations       Recommendations for Other Services       Precautions / Restrictions Precautions Precautions: Fall Precaution Comments: monitor HR, colostomy, drains, VAC     Mobility  Bed Mobility               General bed mobility comments: deferred    Transfers                   General transfer comment: deferred    Ambulation/Gait                   Stairs             Wheelchair Mobility    Modified Rankin (Stroke Patients Only)       Balance                                            Cognition  Arousal/Alertness: Lethargic Behavior During Therapy: WFL for tasks assessed/performed;Flat affect                                            Exercises General Exercises - Lower Extremity Ankle Circles/Pumps: AROM;Both;10 reps Long Arc Quad: AAROM;Right;10 reps Heel Slides: AAROM;Both;10 reps Hip ABduction/ADduction: AAROM;Both;10 reps    General Comments        Pertinent Vitals/Pain Faces Pain Scale: Hurts worst Pain Descriptors / Indicators: Grimacing;Cramping Pain Intervention(s): Monitored during session;Limited activity within patient's tolerance;RN gave pain meds during session    Home Living                          Prior Function            PT Goals (current goals can now be found in the care plan section) Acute Rehab PT Goals Patient Stated Goal: regain independence PT Goal Formulation: With patient/family Time For Goal Achievement: 05/01/21 Potential to Achieve Goals: Good Progress towards PT goals: Goals downgraded-see care plan    Frequency    Min 3X/week      PT Plan Current plan remains appropriate  Co-evaluation              AM-PAC PT "6 Clicks" Mobility   Outcome Measure  Help needed turning from your back to your side while in a flat bed without using bedrails?: Total Help needed moving from lying on your back to sitting on the side of a flat bed without using bedrails?: Total Help needed moving to and from a bed to a chair (including a wheelchair)?: Total Help needed standing up from a chair using your arms (e.g., wheelchair or bedside chair)?: Total Help needed to walk in hospital room?: Total Help needed climbing 3-5 steps with a railing? : Total 6 Click Score: 6    End of Session   Activity Tolerance: Patient limited by pain;Treatment limited secondary to medical complications (Comment) Patient left: in bed;with call bell/phone within reach;with family/visitor present Nurse Communication: Mobility  status PT Visit Diagnosis: Unsteadiness on feet (R26.81);Other abnormalities of gait and mobility (R26.89);Muscle weakness (generalized) (M62.81);Pain     Time: 6314-9702 PT Time Calculation (min) (ACUTE ONLY): 28 min  Charges:  $Therapeutic Exercise: 8-22 mins                     Tresa Endo PT Acute Rehabilitation Services Pager [phone] Office [phone]    Claretha Cooper 04/17/2021, 4:44 PM

## 2021-04-17 NOTE — Progress Notes (Signed)
NAME:  Mikayla Rios, MRN:  [id number], DOB:  [birth year], LOS: 7 ADMISSION DATE:  04/10/2021, CONSULTATION DATE:  04/15/32 REFERRING MD:  Tawana Scale, CHIEF COMPLAINT:  septic shock   History of Present Illness:  35 y/o F with a history of perforated diverticulitis in May 2022, requiring 2 drains for abscesses. She had progressive weight loss, poor PO intake, and weakness with lightheadedness, prompting readmission to the hospital. She had developed an enterocolonic fistula draining stool. CT demonstrated a large LLQ abscess behind the descending colon. She was treated with broad spetrum antibiotics and underwent laparoscopic left colectomy with end colostomy and partial small bowel resection with left retroperitoneal and flank abscess drainage. Intraoperatively she developed bradycardia and hypotension.  She was transferred to the ICU intubated and on vasopressors. Arterial line placed intra-operatively and she received 1 unit pRBCs, although there was low EBL. Extubated 11/10.   Pertinent  Medical History  OSA Perforated diverticulitis May 2022 HTN Obesity Chronic sinus tachycardia COVID May 2022   Significant Hospital Events: Including procedures, antibiotic start and stop dates in addition to other pertinent events   11/4 admitted to hospital - weakness, lightheadedness> large abscess in LLQ . Has chronic enterocolonic fistula. 11/9 colectomy with colostomy, A-line insertion, brought back to the ICU on vent 11/10 On vent/SBT, calm, ST persists, 57ml/hr UOP.  Tachy/anxious. Extubated, improved.  Interim History / Subjective:  Afebrile Hgb down to 6.4, 1 unit PRBC ordered Pt reports ongoing intermittent pain in right abdomen, burning at times but otherwise feels good  Off vasopressors, HR improved  I/O 870 ml UOP, 350 ml NGT, 360 ml from JP drain, +1.1L in last 24 hours   Objective   Blood pressure 129/76, pulse 86, temperature (!) 97.5 F (36.4 C), temperature source Oral, resp. rate  19, height 5\' 3"  (1.6 m), weight 99.5 kg, last menstrual period 04/10/2021, SpO2 100 %.    Vent Mode: PSV;CPAP FiO2 (%):  [30 %] 30 % PEEP:  [5 cmH20] 5 cmH20 Pressure Support:  [5 cmH20-8 cmH20] 5 cmH20   Intake/Output Summary (Last 24 hours) at 04/17/2021 0718 Last data filed at 04/17/2021 0512 Gross per 24 hour  Intake 2848.44 ml  Output 1695 ml  Net 1153.44 ml   Filed Weights   04/10/21 1436 04/15/21 0808 04/15/21 1800  Weight: 90.7 kg 123.6 kg 99.5 kg    Examination: General: chronically ill appearing adult female sitting up in bed in NAD HEENT: MM pale, moist. Good dentition. Anicteric.  Neuro: AAOx4, speech clear, MAE  CV: s1s2 RRR, no m/r/g, ST 110's on monitor PULM: non-labored at rest, lungs bilaterally clear anterior, diminished bases GI: soft, bsx4 hypoactive, stoma pink with serosanguinous drainage, VAC in place, JP drain with serosanguinous drainage Extremities: warm/dry, no overt edema but difficult to assess with body habitus  Skin: no rashes or lesions   Resolved Hospital Problem list     Assessment & Plan:   Acute respiratory failure with hypoxia post operatively H/o OSA -pulmonary hygiene - IS, mobilize  -wean O2 for sats >90% -follow intermittent CXR -may need CPAP QHS but NGT would impede mask fit -avoid oversedation   Septic shock due to intraabdominal abscess from perforated diverticulitis, s/p multiple previous drains, now with enterocolonic fistula. S/p colectomy and diverting ostomy. E-Coli UTI (POS) -wound management and post operative care per CCS -continue meropenem + eraxis  -follow fever curve / WBC trend > improving   Sinus Tachycardia  Baseline tachy, seen by cardiology.  Suspect current admission tachy related  to sepsis + anxiety. Improved.  -monitor off vasopressors  -tele monitoring   Acute on Chronic anemia in setting of Critical Illness  -transfuse 1 unit PRBC  -repeat H/H 1600, if further drop, consider CT abd to r/o  bleeding   Hypokalemia Hyponatremia  Hypomagnesemia  -KCL 20 mEQ IV  -follow electrolytes and replace as indicated   Morbid obesity Severe Protein Calorie Malnutrition -supportive care  -encourage modest long term weight loss   At risk for AKI -Trend BMP / urinary output -Replace electrolytes as indicated -Avoid nephrotoxic agents, ensure adequate renal perfusion  Hyperglycemia -SSI, sensitive scale   Best Practice (right click and "Reselect all SmartList Selections" daily)  Diet/type: NPO DVT prophylaxis: SCD GI prophylaxis: PPI Lines: Central line, Arterial Line, and yes and it is still needed Foley:  Yes, and it is still needed Code Status:  full code Last date of multidisciplinary goals of care discussion: full code     Change to SDU status.  To TR[facility] as of 11/12 am for primary service.    Noe Gens, MSN, APRN, NP-C, AGACNP-BC [facility] 04/17/2021, 7:18 AM   Please see [email or web address] for pager details.   From 7A-7P if no response, please call [phone] After hours, please call ELink [phone]

## 2021-04-17 NOTE — Progress Notes (Addendum)
Mikayla Rios from lab called about a Hgb of 6.4. Elink called, spoke to June aware.  Also informed of the blood glucose level trending downwardly.

## 2021-04-18 DIAGNOSIS — D649 Anemia, unspecified: Secondary | ICD-10-CM | POA: Diagnosis not present

## 2021-04-18 DIAGNOSIS — K5720 Diverticulitis of large intestine with perforation and abscess without bleeding: Secondary | ICD-10-CM | POA: Diagnosis not present

## 2021-04-18 DIAGNOSIS — J9601 Acute respiratory failure with hypoxia: Secondary | ICD-10-CM | POA: Diagnosis not present

## 2021-04-18 DIAGNOSIS — I10 Essential (primary) hypertension: Secondary | ICD-10-CM | POA: Diagnosis not present

## 2021-04-18 LAB — GLUCOSE, CAPILLARY
Glucose-Capillary: 114 mg/dL — ABNORMAL HIGH (ref 70–99)
Glucose-Capillary: 125 mg/dL — ABNORMAL HIGH (ref 70–99)
Glucose-Capillary: 126 mg/dL — ABNORMAL HIGH (ref 70–99)
Glucose-Capillary: 84 mg/dL (ref 70–99)

## 2021-04-18 LAB — PHOSPHORUS: Phosphorus: 3.6 mg/dL (ref 2.5–4.6)

## 2021-04-18 LAB — BASIC METABOLIC PANEL
Anion gap: 4 — ABNORMAL LOW (ref 5–15)
BUN: 21 mg/dL — ABNORMAL HIGH (ref 6–20)
CO2: 27 mmol/L (ref 22–32)
Calcium: 7.6 mg/dL — ABNORMAL LOW (ref 8.9–10.3)
Chloride: 108 mmol/L (ref 98–111)
Creatinine, Ser: 0.52 mg/dL (ref 0.44–1.00)
GFR, Estimated: >60 mL/min (ref >60)
Glucose, Bld: 123 mg/dL — ABNORMAL HIGH (ref 70–99)
Potassium: 3.7 mmol/L (ref 3.5–5.1)
Sodium: 139 mmol/L (ref 135–145)

## 2021-04-18 LAB — CBC
HCT: 19.5 % — ABNORMAL LOW (ref 36.0–46.0)
HCT: 23.7 % — ABNORMAL LOW (ref 36.0–46.0)
Hemoglobin: 6.5 g/dL — CL (ref 12.0–15.0)
Hemoglobin: 8 g/dL — ABNORMAL LOW (ref 12.0–15.0)
MCH: 30 pg (ref 26.0–34.0)
MCH: 30.1 pg (ref 26.0–34.0)
MCHC: 33.3 g/dL (ref 30.0–36.0)
MCHC: 33.8 g/dL (ref 30.0–36.0)
MCV: 89.9 fL (ref 80.0–100.0)
MCV: 89.1 fL (ref 80.0–100.0)
Platelets: 250 10*3/uL (ref 150–400)
Platelets: 334 10*3/uL (ref 150–400)
RBC: 2.17 MIL/uL — ABNORMAL LOW (ref 3.87–5.11)
RBC: 2.66 MIL/uL — ABNORMAL LOW (ref 3.87–5.11)
RDW: 19 % — ABNORMAL HIGH (ref 11.5–15.5)
RDW: 18.4 % — ABNORMAL HIGH (ref 11.5–15.5)
WBC: 13.3 10*3/uL — ABNORMAL HIGH (ref 4.0–10.5)
WBC: 22.2 10*3/uL — ABNORMAL HIGH (ref 4.0–10.5)
nRBC: 0 % (ref 0.0–0.2)
nRBC: 0 % (ref 0.0–0.2)

## 2021-04-18 LAB — HEMOGLOBIN AND HEMATOCRIT, BLOOD
HCT: 23.7 % — ABNORMAL LOW (ref 36.0–46.0)
Hemoglobin: 8 g/dL — ABNORMAL LOW (ref 12.0–15.0)

## 2021-04-18 LAB — PREPARE RBC (CROSSMATCH)

## 2021-04-18 LAB — MAGNESIUM: Magnesium: 2.2 mg/dL (ref 1.7–2.4)

## 2021-04-18 MED ORDER — METOPROLOL TARTRATE 5 MG/5ML IV SOLN
5.0000 mg | Freq: Four times a day (QID) | INTRAVENOUS | Status: DC
  Administered 2021-04-18 – 2021-04-20 (×7): 5 mg via INTRAVENOUS
  Filled 2021-04-18 (×7): qty 5

## 2021-04-18 MED ORDER — POTASSIUM CHLORIDE 10 MEQ/50ML IV SOLN
10.0000 meq | INTRAVENOUS | Status: AC
  Administered 2021-04-18 (×3): 10 meq via INTRAVENOUS
  Filled 2021-04-18 (×3): qty 50

## 2021-04-18 MED ORDER — LABETALOL HCL 5 MG/ML IV SOLN
10.0000 mg | INTRAVENOUS | Status: DC | PRN
  Administered 2021-04-18: 20 mg via INTRAVENOUS
  Administered 2021-04-18: 10 mg via INTRAVENOUS
  Administered 2021-04-18 – 2021-04-19 (×2): 20 mg via INTRAVENOUS
  Filled 2021-04-18 (×5): qty 4

## 2021-04-18 MED ORDER — METOPROLOL TARTRATE 5 MG/5ML IV SOLN
2.5000 mg | Freq: Four times a day (QID) | INTRAVENOUS | Status: DC
Start: 2021-04-18 — End: 2021-04-18
  Administered 2021-04-18: 2.5 mg via INTRAVENOUS
  Filled 2021-04-18: qty 5

## 2021-04-18 MED ORDER — SODIUM CHLORIDE 0.9 % IV SOLN: INTRAVENOUS | Status: DC | PRN

## 2021-04-18 MED ORDER — HYDRALAZINE HCL 20 MG/ML IJ SOLN
10.0000 mg | Freq: Four times a day (QID) | INTRAMUSCULAR | Status: DC | PRN
  Administered 2021-04-18 – 2021-04-22 (×3): 10 mg via INTRAVENOUS
  Filled 2021-04-18 (×3): qty 1

## 2021-04-18 MED ORDER — SODIUM CHLORIDE 0.9% IV SOLUTION: Freq: Once | INTRAVENOUS | Status: AC

## 2021-04-18 MED ORDER — TRAVASOL 10 % IV SOLN
INTRAVENOUS | Status: AC
  Filled 2021-04-18: qty 1188

## 2021-04-18 NOTE — Progress Notes (Signed)
PHARMACY - TOTAL PARENTERAL NUTRITION CONSULT NOTE   Indication:  intolerance to enteral feeding  Patient Measurements: Height: 5\' 3"  (160 cm) Weight: 99.5 kg (219 lb 5.7 oz) IBW/kg (Calculated) : 52.4 TPN AdjBW (KG): 62 Body mass index is 38.86 kg/m. Usual Weight: 90.7  Assessment: 35 yo F s/p laparoscopic assisted Hartmann (left colectomy / end colostomy), small bowel resection, left retroperitoneal / flank abscess drainage on 04/15/21.   Start TPN per pharmacy for nutrition support.  PMH:  hypertension, inappropriate sinus tachycardia, obesity, obstructive sleep apnea and diverticulosis with complicated diverticulitis and abscess formation 11/2020 status post IR guided placement of 2 left lower quadrant drains  Glucose / Insulin: no hx DM; A1c 4.6; no PTA medications. - CBGs 86-134 (goal 150-180) - Dexamethasone 10mg  IV on 11/9 - SSI 3 unit / 24 hrs Electrolytes: K 3.7, others WNL, CorrCa 9.2 - hx prolonged QTC, goal K =>4 and Mg =>2 Renal:  WNL Hepatic: WNL Intake / Output; MIVF: UOP down to 550 ml/24 hrs  - Drains up 435 mls/24 hrs, NG up 550 ml/24 hrs, Stool up 780 mL/24 hrs - Transfuse PRBC on 11/11, two units PRBC on 11/12.  No mIVF.  GI Imaging: GI Surgeries / Procedures:  11/9 Lap Hartmann (left colectomy / end colostomy), small bowel resection, left retroperitoneal / flank abscess drainage  Central access: PICC replaced 11/11 TPN start date: 04/16/21  Nutritional Goals:  Goal TPN rate 90 mL/hr provides 119 g of protein and 2225 kcals per day  RD Assessment: (11/11) Estimated Needs Total Energy Estimated Needs: 2150-2400 kcal Total Protein Estimated Needs: 110-125 grams Total Fluid Estimated Needs: >/= 2.2 L/day  Current Nutrition:  NPO TPN held on 11/11 d/t malpositioned PICC  Plan:  Now:  KCl 10 mEq IV x 3 runs  At 18:00 Continue TPN at 90 mL/hr at 1800 (adjusted formula 11/12)  Electrolytes in TPN: Na 58mEq/L, K 60 mEq/L, Ca 75mEq/L, Mg 72mEq/L, and Phos  67mmol/L. Cl:Ac 1:1 Add standard MVI and trace elements to TPN Continue Sensitive q6h SSI and adjust as needed  IVF per MD (none) Monitor TPN labs on Mon/Thurs, BMET, Mg & Phos in AM  Gretta Arab PharmD, [facility] main pharmacy [phone] 04/18/2021 7:20 AM

## 2021-04-18 NOTE — Progress Notes (Signed)
OT Cancellation Note  Patient Details Name: Mikayla Rios MRN: [id number] DOB: [birth year]   Cancelled Treatment:    Reason Eval/Treat Not Completed: Medical issues which prohibited therapy. Pt with Hgb of 6.5, receiving transfusion. Will continue to follow.   Malka So 04/18/2021, 11:04 AM Nestor Lewandowsky, OTR/L Acute Rehabilitation Services Pager: [phone] Office: [phone]

## 2021-04-18 NOTE — Progress Notes (Signed)
3 Days Post-Op   Subjective/Chief Complaint: Has had bloody output from her ostomy    Objective: Vital signs in last 24 hours: Temp:  [97.8 F (36.6 C)-98.5 F (36.9 C)] 98.2 F (36.8 C) (11/12 0803) Pulse Rate:  [100-125] 100 (11/12 0705) Resp:  [14-24] 20 (11/12 0705) BP: (134-188)/(59-98) 171/65 (11/12 0705) SpO2:  [99 %-100 %] 100 % (11/12 0705) Arterial Line BP: (105-124)/(59-71) 113/69 (11/11 1510) Last BM Date: 04/17/21  Intake/Output from previous day: 11/11 0701 - 11/12 0700 In: 3156.5 [I.V.:1652.4; Blood:385; IV Piggyback:1109.1] Out: 2315 [Urine:550; Emesis/NG output:550; Drains:435; Stool:780] Intake/Output this shift: Total I/O In: 72 [I.V.:72] Out: 75 [Drains:75]  Exam: Awake and alert Comfortable Abdomen obese, soft, ostomy pink, old blood in the bag.  No gross bleeding at ostomy surface Drain serosang VAC in place  Lab Results:  Recent Labs    04/17/21 0500 04/17/21 1624 04/18/21 0230  WBC 16.6*  --  13.3*  HGB 6.4* 7.3* 6.5*  HCT 19.4* 21.4* 19.5*  PLT 263  --  250   BMET Recent Labs    04/17/21 0500 04/18/21 0230  NA 137 139  K 3.5 3.7  CL 105 108  CO2 26 27  GLUCOSE 114* 123*  BUN 16 21*  CREATININE 0.53 0.52  CALCIUM 7.6* 7.6*   PT/INR No results for input(s): LABPROT, INR in the last 72 hours. ABG Recent Labs    04/15/21 1456 04/15/21 1753  PHART 7.325* 7.407  HCO3 29.4* 24.5    Studies/Results: DG CHEST PORT 1 VIEW  Result Date: 04/16/2021 CLINICAL DATA:  Respiratory failure EXAM: PORTABLE CHEST 1 VIEW COMPARISON:  Chest x-ray 04/15/2021 FINDINGS: Endotracheal tube tip is 2.7 cm above the carina. Enteric tube tip is in the distal stomach region. Right PICC tip is near the superior aspect of the SVC. Heart size and mediastinum are within normal limits. No consolidation or edema. No pleural effusion or pneumothorax. Small subcutaneous emphysema bilaterally appears similar to slightly decreased since previous study.  IMPRESSION: Medical devices as described.  No acute process identified. Electronically Signed   By: Ofilia Neas M.D.   On: 04/16/2021 11:46   [place] EKG Site Rite  Result Date: 04/17/2021 If Site Rite image not attached, placement could not be confirmed due to current cardiac rhythm.   Anti-infectives: Anti-infectives (From admission, onward)    Start     Dose/Rate Route Frequency Ordered Stop   04/17/21 1700  ceFEPIme (MAXIPIME) 2 g in sodium chloride 0.9 % 100 mL IVPB        2 g 200 mL/hr over 30 Minutes Intravenous Every 8 hours 04/17/21 1530     04/17/21 1700  metroNIDAZOLE (FLAGYL) IVPB 500 mg        500 mg 100 mL/hr over 60 Minutes Intravenous Every 12 hours 04/17/21 1530     04/15/21 1000  anidulafungin (ERAXIS) 100 mg in sodium chloride 0.9 % 100 mL IVPB        100 mg 78 mL/hr over 100 Minutes Intravenous Every 24 hours 04/14/21 0744     04/15/21 0900  clindamycin (CLEOCIN) IVPB 900 mg       See Hyperspace for full Linked Orders Report.   900 mg 100 mL/hr over 30 Minutes Intravenous 60 min pre-op 04/14/21 1405 04/15/21 1142   04/15/21 0900  gentamicin (GARAMYCIN) 340 mg in dextrose 5 % 100 mL IVPB       See Hyperspace for full Linked Orders Report.   5 mg/kg  67.7 kg (Adjusted) 108.5  mL/hr over 60 Minutes Intravenous 60 min pre-op 04/14/21 1405 04/15/21 1834   04/14/21 1000  anidulafungin (ERAXIS) 200 mg in sodium chloride 0.9 % 200 mL IVPB        200 mg 78 mL/hr over 200 Minutes Intravenous  Once 04/14/21 0755 04/14/21 1500   04/13/21 0900  meropenem (MERREM) 1 g in sodium chloride 0.9 % 100 mL IVPB  Status:  Discontinued        1 g 200 mL/hr over 30 Minutes Intravenous Every 8 hours 04/13/21 0800 04/17/21 1530   04/10/21 2200  cefTRIAXone (ROCEPHIN) 2 g in sodium chloride 0.9 % 100 mL IVPB  Status:  Discontinued        2 g 200 mL/hr over 30 Minutes Intravenous Daily at bedtime 04/10/21 2044 04/13/21 0746   04/10/21 2200  metroNIDAZOLE (FLAGYL) IVPB 500 mg  Status:   Discontinued        500 mg 100 mL/hr over 60 Minutes Intravenous Every 12 hours 04/10/21 2044 04/13/21 0745       Assessment/Plan:  S/P LAPAROSCOPIC ASSISTED HARTMANN  (LEFT COLECTOMY / END COLOSTOMY), SMALL BOWEL RESECTION, LEFT RETROPERITONEAL / FLANK ABSCESS DRAINAGE 04/15/21 Dr. [name]   Hgb back down.  Transfusion in progress.  Nothing superficial to suture at the ostomy.  Should stop.  Hold any anticoag meds.   Continue NPO and NG for now Wound vac   Coralie Keens MD 04/18/2021

## 2021-04-18 NOTE — Plan of Care (Signed)

## 2021-04-18 NOTE — Progress Notes (Signed)
PROGRESS NOTE    Mikayla Rios  VQQ:[id number] DOB: Nov 29, 1985 DOA: 04/10/2021 PCP: [name], MD    Chief Complaint  Patient presents with   Weakness   Tachycardia    Brief Narrative:   35 year old female with past medical history of hypertension, inappropriate sinus tachycardia, obesity, obstructive sleep apnea and diverticulosis with complicated diverticulitis and abscess formation 11/2020 status post IR guided placement of 2 left lower quadrant drains who presents to [facility] emergency department with complaints of generalized weakness and lightheadedness. She was found to have new abscess measuring about 8.5 times 10 cm extending into the left lateral abd wall. She underwent drain exchange/upsize on Monday by IR. She subsequently underwent colectomy/colostomy, small bowel resection with retroperitoneal abscess drainage on 11/9. Post op, she was monitored in the ICU on vent and briefly required pressors. She was extubated on 11/10 and has since been weaned off pressors. She was transferred back to [facility] on 11/12  Assessment & Plan:   Principal Problem:   Diverticulitis of large intestine with abscess Active Problems:   Class 3 severe obesity due to excess calories with serious comorbidity and body mass index (BMI) of 60.0 to 69.9 in adult [facility])   Obstructive sleep apnea   Anemia   Inappropriate sinus tachycardia   Essential hypertension   SIRS (systemic inflammatory response syndrome) (HCC)   Hypokalemia, inadequate intake   Protein-calorie malnutrition, severe (HCC)   Reactive thrombocytosis   Prolonged QT interval   Complicated diverticulitis with abscess s/p drain placement by IR twice in June 2022 with numerous exchanges since then due to persistent fistulous -CT of the abdomen and pelvis New lobulated enhancing fluid collection posterior to this segment of inflamed colon measuring 8.5 x 3.5 x 10.0 cm. This collection now invades the adjacent iliopsoas muscle  as well as extends through the left lateral abdominal wall. -Patient has had prolonged treatment with antibiotics and multiple drain placements, but despite conservative measures, she has failed to improve and now has a new abscess -Seen by general surgery and she underwent colectomy/colostomy and drainage of retroperitoneal abscess -Currently on IV cefepime/ flagyl and anidulafungin per surgery -Currently with NG tube in place -She has wound VAC over abdominal wound -Defer further postop care to surgery  Septic shock -Secondary to retroperitoneal abscess -Patient required vasopressor therapy postoperatively, but has been able to wean off pressors -Blood pressures now elevated -She is on broad-spectrum antibiotics  Acute respiratory failure with hypoxia requiring mechanical ventilation -Patient was intubated for operative management and due to hemodynamic instability postoperatively, decision was made to leave the patient intubated and monitored in the ICU -She was able to extubate on 11/10 and respiratory status has been stable  Possible left colon lesion -Noted on CT scan from 02/04/2021 -She had been referred to GI as an outpatient for colonoscopy -This can be pursued on an elective basis once her acute issues have resolved  Pyuria Urine cultures ordered, growing e coli, -She is on appropriate antibiotics    Mechanical Fall:  Patient reports dizziness and an episode of syncope and an episode of mechanical fall possibly from orthostatic hypotension.  Monitor on telemetry. CT angiogram of the chest ruled out PE EKG shows prolonged QT interval 636.  Nonspecific T wave inversions.troponin is negative. Patient denies any chest pain or shortness of breath. Probably secondary to orthostatic hypotension from infection. Overall QTC has improved   Hypokalemia Replaced   Prolonged QTc: - from hypokalemia and hypomagnesemia. - K is 4.3. magnesium  is 1.8. Keep k >4 and mag>2.    Repeat EKG shows improvement in the qtC.    Anemia of chronic disease/ Acute anemia of acute illness - normocytic.  -Baseline creatinine appears to be between 7-8 - anemia panel  Shows low folate levels.  -She was ttransfused a total of 3 units PRBC pre and intraoperatively -continues to have bleeding in ostomy -Hgb down to 6.5 today -additional 2 units prbc ordered today -Continue to follow CBC Transfuse to keep hemoglobin greater than 7.    Hypoalbuminemia:  - will get dietary consult.  -currently on TPN since NPO   Sinus tachycardia she was previously on metoprolol Probably from anemia and infection and pain.  Pt not symptomatic.  Echocardiogram unremarkable  Hypertension:  Blood pressures trending up, will restart low dose lopressor  Leukocytosis:  Related to infectious process Trending down   DVT prophylaxis: SCDs Code Status: Full code Family Communication: Discussed with patient at the bedside Disposition:   Status is: Inpatient  Remains inpatient appropriate because: post op management, IV antibiotics, anemia needing transfusion       Consultants:  Surgery IR Gastroenterology Cardiology PCCM  Procedures: Laparoscopic left colectomy/end colostomy, small bowel resection, left retroperitoneal/flank abscess drainage  ETT 11/09>11/10  Antimicrobials:  Antibiotics Given (last 72 hours)     Date/Time Action Medication Dose Rate   04/15/21 1122 Given   clindamycin (CLEOCIN) IVPB 900 mg 900 mg    04/15/21 1122 New Bag/Given   gentamicin (GARAMYCIN) 340 mg in dextrose 5 % 100 mL IVPB 340 mg    04/16/21 0016 New Bag/Given   meropenem (MERREM) 1 g in sodium chloride 0.9 % 100 mL IVPB 1 g 200 mL/hr   04/16/21 0926 New Bag/Given   meropenem (MERREM) 1 g in sodium chloride 0.9 % 100 mL IVPB 1 g 200 mL/hr   04/16/21 1747 New Bag/Given   meropenem (MERREM) 1 g in sodium chloride 0.9 % 100 mL IVPB 1 g 200 mL/hr   04/16/21 2343 New Bag/Given   meropenem  (MERREM) 1 g in sodium chloride 0.9 % 100 mL IVPB 1 g 200 mL/hr   04/17/21 1034 New Bag/Given  [PICC line was being exchanged]   meropenem (MERREM) 1 g in sodium chloride 0.9 % 100 mL IVPB 1 g 200 mL/hr   04/17/21 1617 New Bag/Given   ceFEPIme (MAXIPIME) 2 g in sodium chloride 0.9 % 100 mL IVPB 2 g 200 mL/hr   04/17/21 1657 New Bag/Given   metroNIDAZOLE (FLAGYL) IVPB 500 mg 500 mg 100 mL/hr   04/18/21 0008 New Bag/Given   ceFEPIme (MAXIPIME) 2 g in sodium chloride 0.9 % 100 mL IVPB 2 g 200 mL/hr   04/18/21 0440 New Bag/Given   metroNIDAZOLE (FLAGYL) IVPB 500 mg 500 mg 100 mL/hr   04/18/21 1019 New Bag/Given   ceFEPIme (MAXIPIME) 2 g in sodium chloride 0.9 % 100 mL IVPB 2 g 200 mL/hr         Subjective: Reports that pain is controlled, no shortness of breath  Objective: Vitals:   04/18/21 0705 04/18/21 0800 04/18/21 0803 04/18/21 0858  BP: (!) 171/65 (!) 162/66  (!) 156/78  Pulse: 100 100  95  Resp: 20 (!) 22  (!) 22  Temp:   98.2 F (36.8 C) 98.5 F (36.9 C)  TempSrc:   Oral Oral  SpO2: 100% 100%  100%  Weight:      Height:        Intake/Output Summary (Last 24 hours) at 04/18/2021  Peck filed at 04/18/2021 0858 Gross per 24 hour  Intake 3262.94 ml  Output 2105 ml  Net 1157.94 ml   Filed Weights   04/10/21 1436 04/15/21 0808 04/15/21 1800  Weight: 90.7 kg 123.6 kg 99.5 kg    Examination:  General exam: Alert, awake, oriented x 3 Respiratory system: Clear to auscultation. Respiratory effort normal. Cardiovascular system:RRR. No murmurs, rubs, gallops. Gastrointestinal system: Abdomen is nondistended, soft and nontender. No organomegaly or masses felt. Normal bowel sounds heard. Dark bloody output noted in ostomy bag, wound vac in place Central nervous system: Alert and oriented. No focal neurological deficits. Extremities: No C/C/E, +pedal pulses Skin: No rashes, lesions or ulcers Psychiatry: Judgement and insight appear normal. Mood & affect  appropriate.     Data Reviewed: I have personally reviewed following labs and imaging studies  CBC: Recent Labs  Lab 04/15/21 0342 04/15/21 1456 04/15/21 1815 04/16/21 0500 04/17/21 0500 04/17/21 1624 04/18/21 0230  WBC 13.2*  --  22.3* 28.1* 16.6*  --  13.3*  HGB 7.5*   < > 9.7* 9.2* 6.4* 7.3* 6.5*  HCT 23.0*   < > 28.9* 26.8* 19.4* 21.4* 19.5*  MCV 90.2  --  89.8 90.2 92.4  --  89.9  PLT 407*  --  363 372 263  --  250   < > = values in this interval not displayed.    Basic Metabolic Panel: Recent Labs  Lab 04/15/21 0337 04/15/21 1456 04/15/21 1815 04/16/21 0500 04/17/21 0500 04/18/21 0230  NA 136 141 135 132* 137 139  K 3.4* 3.7 3.7 3.8 3.5 3.7  CL 104  --  106 101 105 108  CO2 27  --  _0 GLUCOSE 83  --  150* 290* 114* 123*  BUN 7  --  _1 21*  CREATININE 0.43*  --  0.35* 0.59 0.53 0.52  CALCIUM 7.1*  --  7.4* 7.2* 7.6* 7.6*  MG 1.3*  --  1.3* 1.7 2.2 2.2  PHOS  --   --  4.5 4.3 3.4 3.6    GFR: Estimated Creatinine Clearance: 108.2 mL/min (by C-G formula based on SCr of 0.52 mg/dL).  Liver Function Tests: Recent Labs  Lab 04/15/21 1815 04/16/21 0500 04/17/21 0500  AST 22 15 13*  ALT _2 ALKPHOS 71 62 51  BILITOT 1.1 0.6 0.5  PROT 4.2* 4.2* 4.2*  ALBUMIN 1.9* 1.7* 2.0*    CBG: Recent Labs  Lab 04/17/21 1146 04/17/21 1550 04/17/21 1759 04/17/21 2332 04/18/21 0514  GLUCAP 92 86 124* 134* 126*     Recent Results (from the past 240 hour(s))  Resp Panel by RT-PCR (Flu A&B, Covid) Nasopharyngeal Swab     Status: None   Collection Time: 04/10/21  2:46 PM   Specimen: Nasopharyngeal Swab; Nasopharyngeal(NP) swabs in vial transport medium  Result Value Ref Range Status   SARS Coronavirus 2 by RT PCR NEGATIVE NEGATIVE Final    Comment: (NOTE) SARS-CoV-2 target nucleic acids are NOT DETECTED.  The SARS-CoV-2 RNA is generally detectable in upper respiratory specimens during the acute phase of infection. The  lowest concentration of SARS-CoV-2 viral copies this assay can detect is 138 copies/mL. A negative result does not preclude SARS-Cov-2 infection and should not be used as the sole basis for treatment or other patient management decisions. A negative result may occur with  improper specimen collection/handling, submission of specimen other than nasopharyngeal swab, presence of viral mutation(s) within the areas  targeted by this assay, and inadequate number of viral copies(<138 copies/mL). A negative result must be combined with clinical observations, patient history, and epidemiological information. The expected result is Negative.  Fact Sheet for Patients:  EntrepreneurPulse.com.au  Fact Sheet for Healthcare Providers:  IncredibleEmployment.be  This test is no t yet approved or cleared by the [place] FDA and  has been authorized for detection and/or diagnosis of SARS-CoV-2 by FDA under an Emergency Use Authorization (EUA). This EUA will remain  in effect (meaning this test can be used) for the duration of the COVID-19 declaration under Section 564(b)(1) of the Act, 21 U.S.C.section 360bbb-3(b)(1), unless the authorization is terminated  or revoked sooner.       Influenza A by PCR NEGATIVE NEGATIVE Final   Influenza B by PCR NEGATIVE NEGATIVE Final    Comment: (NOTE) The Xpert Xpress SARS-CoV-2/FLU/RSV plus assay is intended as an aid in the diagnosis of influenza from Nasopharyngeal swab specimens and should not be used as a sole basis for treatment. Nasal washings and aspirates are unacceptable for Xpert Xpress SARS-CoV-2/FLU/RSV testing.  Fact Sheet for Patients: EntrepreneurPulse.com.au  Fact Sheet for Healthcare Providers: IncredibleEmployment.be  This test is not yet approved or cleared by the [place] FDA and has been authorized for detection and/or diagnosis of SARS-CoV-2 by FDA under  an Emergency Use Authorization (EUA). This EUA will remain in effect (meaning this test can be used) for the duration of the COVID-19 declaration under Section 564(b)(1) of the Act, 21 U.S.C. section 360bbb-3(b)(1), unless the authorization is terminated or revoked.  Performed at [facility], [place] [address], [place], [place] [address]   Blood culture (routine x 2)     Status: None   Collection Time: 04/10/21  4:42 PM   Specimen: BLOOD  Result Value Ref Range Status   Specimen Description   Final    BLOOD RIGHT ANTECUBITAL Performed at [facility] [address], [place], [place] [address]    Special Requests   Final    BOTTLES DRAWN AEROBIC AND ANAEROBIC Blood Culture adequate volume Performed at [facility] [address] [place], [place] [address]    Culture   Final    NO GROWTH 5 DAYS Performed at [facility], [place] [address], [place], [place] [address]    Report Status 04/15/2021 FINAL  Final  Aerobic/Anaerobic Culture w Gram Stain (surgical/deep wound)     Status: None   Collection Time: 04/10/21  7:56 PM   Specimen: Abscess  Result Value Ref Range Status   Specimen Description   Final    ABSCESS Performed at [facility] [address] [place], [place] [address]    Special Requests   Final    NONE Performed at [facility], [place] [address] [place], [place] [address]    Gram Stain   Final    NO SQUAMOUS EPITHELIAL CELLS SEEN FEW WBC SEEN FEW GRAM POSITIVE COCCI    Culture   Final    FEW ESCHERICHIA COLI FEW ACTINOMYCES ODONTOLYTICUS Standardized susceptibility testing for this organism is not available. MODERATE BACTEROIDES OVATUS BETA LACTAMASE POSITIVE Performed at [facility], [place] [address] [place],  [address]    Report Status 04/13/2021 FINAL  Final   Organism ID, Bacteria ESCHERICHIA COLI  Final      Susceptibility   Escherichia  coli - MIC*    AMPICILLIN >=32 RESISTANT Resistant     CEFAZOLIN 8  SENSITIVE Sensitive     CEFEPIME <=0.12 SENSITIVE Sensitive     CEFTAZIDIME <=1 SENSITIVE Sensitive     CEFTRIAXONE <=0.25 SENSITIVE Sensitive     CIPROFLOXACIN >=4 RESISTANT Resistant     GENTAMICIN <=1 SENSITIVE Sensitive     IMIPENEM <=0.25 SENSITIVE Sensitive     TRIMETH/SULFA <=20 SENSITIVE Sensitive     AMPICILLIN/SULBACTAM >=32 RESISTANT Resistant     PIP/TAZO <=4 SENSITIVE Sensitive     * FEW ESCHERICHIA COLI  Urine Culture     Status: Abnormal   Collection Time: 04/11/21  1:09 PM   Specimen: Urine, Clean Catch  Result Value Ref Range Status   Specimen Description   Final    URINE, CLEAN CATCH Performed at [facility], [place] [address], [place], [place] [address]    Special Requests   Final    NONE Performed at [facility], [place] [address] [place], [place] [address]    Culture 70,000 COLONIES/mL ESCHERICHIA COLI (A)  Final   Report Status 04/13/2021 FINAL  Final   Organism ID, Bacteria ESCHERICHIA COLI (A)  Final      Susceptibility   Escherichia coli - MIC*    AMPICILLIN >=32 RESISTANT Resistant     CEFAZOLIN 8 SENSITIVE Sensitive     CEFEPIME <=0.12 SENSITIVE Sensitive     CEFTRIAXONE <=0.25 SENSITIVE Sensitive     CIPROFLOXACIN >=4 RESISTANT Resistant     GENTAMICIN <=1 SENSITIVE Sensitive     IMIPENEM <=0.25 SENSITIVE Sensitive     NITROFURANTOIN <=16 SENSITIVE Sensitive     TRIMETH/SULFA <=20 SENSITIVE Sensitive     AMPICILLIN/SULBACTAM >=32 RESISTANT Resistant     PIP/TAZO <=4 SENSITIVE Sensitive     * 70,000 COLONIES/mL ESCHERICHIA COLI  Surgical PCR screen     Status: None   Collection Time: 04/14/21  4:34 PM   Specimen: Nasal Mucosa; Nasal Swab  Result Value Ref Range Status   MRSA, PCR NEGATIVE NEGATIVE Final   Staphylococcus aureus NEGATIVE NEGATIVE Final    Comment: (NOTE) The Xpert SA Assay (FDA approved for NASAL specimens in patients  63 years of age and older), is one component of a comprehensive surveillance program. It is not intended to diagnose infection nor to guide or monitor treatment. Performed at [facility], [place] [address], [place], [place] [address]           Radiology Studies: DG CHEST PORT 1 VIEW  Result Date: 04/16/2021 CLINICAL DATA:  Respiratory failure EXAM: PORTABLE CHEST 1 VIEW COMPARISON:  Chest x-ray 04/15/2021 FINDINGS: Endotracheal tube tip is 2.7 cm above the carina. Enteric tube tip is in the distal stomach region. Right PICC tip is near the superior aspect of the SVC. Heart size and mediastinum are within normal limits. No consolidation or edema. No pleural effusion or pneumothorax. Small subcutaneous emphysema bilaterally appears similar to slightly decreased since previous study. IMPRESSION: Medical devices as described.  No acute process identified. Electronically Signed   By: Ofilia Neas M.D.   On: 04/16/2021 11:46   [place] EKG Site Rite  Result Date: 04/17/2021 If Site Rite image not attached, placement could not be confirmed due to current cardiac rhythm.       Scheduled Meds:  chlorhexidine gluconate (MEDLINE KIT)  15 mL Mouth Rinse BID   Chlorhexidine Gluconate Cloth  6 each Topical Daily   insulin aspart  0-9 Units Subcutaneous Q6H   lip balm  1 application Topical BID   metoprolol tartrate  2.5 mg  Intravenous Q6H   mupirocin ointment  1 application Nasal BID   pantoprazole (PROTONIX) IV  40 mg Intravenous Daily   Continuous Infusions:  sodium chloride 10 mL/hr at 04/18/21 1028   acetaminophen Stopped (04/18/21 0606)   anidulafungin 100 mg (04/18/21 1020)   ceFEPime (MAXIPIME) IV 2 g (04/18/21 1019)   methocarbamol (ROBAXIN) IV     metronidazole Stopped (04/18/21 0540)   potassium chloride 10 mEq (04/18/21 1030)   TPN ADULT (ION) 90 mL/hr at 04/18/21 0705   TPN ADULT (ION)       LOS: 8 days   Time spent: 35 mins    Kathie Dike,  MD [facility]   To contact the attending provider between 7A-7P or the covering provider during after hours 7P-7A, please log into the web site [email or web address] and access using universal [place] password for that web site. If you do not have the password, please call the hospital operator.  04/18/2021, 10:48 AM

## 2021-04-18 NOTE — Progress Notes (Signed)
[facility] Progress Note Patient Name: Mikayla Rios DOB: [birth year] MRN: [id number]   Date of Service  04/18/2021  HPI/Events of Note  Patient with SBP > 180 mmHg, HR 113.  eICU Interventions  PRN Labetalol ordered.        Shyane Fossum U Kelena Garrow 04/18/2021, 1:30 AM

## 2021-04-18 NOTE — Progress Notes (Signed)
Elink called regarding BP, see new orders.

## 2021-04-18 NOTE — Progress Notes (Addendum)
Kennedy from lab called with a critical  HGB level of 6.4. ELink called informed.   Surgery paged, Dr. Ninfa Linden [phone], regarding HGB, and increased bloody  drainage from ostomy. He requested two units PRBC be transfused. Decreased urine output was also reported to the surgical team, see new orders.   Will continue to monitor.

## 2021-04-19 ENCOUNTER — Inpatient Hospital Stay (HOSPITAL_COMMUNITY): Payer: BC Managed Care – PPO

## 2021-04-19 DIAGNOSIS — R609 Edema, unspecified: Secondary | ICD-10-CM

## 2021-04-19 DIAGNOSIS — I10 Essential (primary) hypertension: Secondary | ICD-10-CM | POA: Diagnosis not present

## 2021-04-19 DIAGNOSIS — K5720 Diverticulitis of large intestine with perforation and abscess without bleeding: Secondary | ICD-10-CM | POA: Diagnosis not present

## 2021-04-19 DIAGNOSIS — D649 Anemia, unspecified: Secondary | ICD-10-CM | POA: Diagnosis not present

## 2021-04-19 DIAGNOSIS — J9601 Acute respiratory failure with hypoxia: Secondary | ICD-10-CM | POA: Diagnosis not present

## 2021-04-19 LAB — GLUCOSE, CAPILLARY
Glucose-Capillary: 119 mg/dL — ABNORMAL HIGH (ref 70–99)
Glucose-Capillary: 107 mg/dL — ABNORMAL HIGH (ref 70–99)
Glucose-Capillary: 95 mg/dL (ref 70–99)
Glucose-Capillary: 109 mg/dL — ABNORMAL HIGH (ref 70–99)
Glucose-Capillary: 113 mg/dL — ABNORMAL HIGH (ref 70–99)

## 2021-04-19 LAB — PREPARE RBC (CROSSMATCH)

## 2021-04-19 LAB — HEMOGLOBIN AND HEMATOCRIT, BLOOD
HCT: 25.9 % — ABNORMAL LOW (ref 36.0–46.0)
Hemoglobin: 8.7 g/dL — ABNORMAL LOW (ref 12.0–15.0)

## 2021-04-19 LAB — COMPREHENSIVE METABOLIC PANEL
ALT: 9 U/L (ref 0–44)
AST: 10 U/L — ABNORMAL LOW (ref 15–41)
Albumin: 1.5 g/dL — ABNORMAL LOW (ref 3.5–5.0)
Alkaline Phosphatase: 47 U/L (ref 38–126)
Anion gap: 3 — ABNORMAL LOW (ref 5–15)
BUN: 25 mg/dL — ABNORMAL HIGH (ref 6–20)
CO2: 25 mmol/L (ref 22–32)
Calcium: 7.4 mg/dL — ABNORMAL LOW (ref 8.9–10.3)
Chloride: 112 mmol/L — ABNORMAL HIGH (ref 98–111)
Creatinine, Ser: 0.41 mg/dL — ABNORMAL LOW (ref 0.44–1.00)
GFR, Estimated: >60 mL/min (ref >60)
Glucose, Bld: 105 mg/dL — ABNORMAL HIGH (ref 70–99)
Potassium: 4.5 mmol/L (ref 3.5–5.1)
Sodium: 140 mmol/L (ref 135–145)
Total Bilirubin: 0.3 mg/dL (ref 0.3–1.2)
Total Protein: 4 g/dL — ABNORMAL LOW (ref 6.5–8.1)

## 2021-04-19 LAB — CBC
HCT: 21.2 % — ABNORMAL LOW (ref 36.0–46.0)
Hemoglobin: 6.9 g/dL — CL (ref 12.0–15.0)
MCH: 29.4 pg (ref 26.0–34.0)
MCHC: 32.5 g/dL (ref 30.0–36.0)
MCV: 90.2 fL (ref 80.0–100.0)
Platelets: 312 10*3/uL (ref 150–400)
RBC: 2.35 MIL/uL — ABNORMAL LOW (ref 3.87–5.11)
RDW: 18.3 % — ABNORMAL HIGH (ref 11.5–15.5)
WBC: 17.2 10*3/uL — ABNORMAL HIGH (ref 4.0–10.5)
nRBC: 0 % (ref 0.0–0.2)

## 2021-04-19 LAB — PROTIME-INR
INR: 1.2 (ref 0.8–1.2)
Prothrombin Time: 15 seconds (ref 11.4–15.2)

## 2021-04-19 LAB — PHOSPHORUS: Phosphorus: 3.1 mg/dL (ref 2.5–4.6)

## 2021-04-19 LAB — MAGNESIUM: Magnesium: 2 mg/dL (ref 1.7–2.4)

## 2021-04-19 LAB — APTT: aPTT: 35 seconds (ref 24–36)

## 2021-04-19 MED ORDER — ALBUMIN HUMAN 25 % IV SOLN
25.0000 g | Freq: Four times a day (QID) | INTRAVENOUS | Status: AC
  Administered 2021-04-19 – 2021-04-20 (×4): 25 g via INTRAVENOUS
  Filled 2021-04-19 (×4): qty 100

## 2021-04-19 MED ORDER — DIPHENHYDRAMINE HCL 12.5 MG/5ML PO ELIX: 12.5000 mg | ORAL_SOLUTION | Freq: Four times a day (QID) | ORAL | Status: DC | PRN

## 2021-04-19 MED ORDER — SODIUM CHLORIDE 0.9% IV SOLUTION: Freq: Once | INTRAVENOUS | Status: AC

## 2021-04-19 MED ORDER — SODIUM CHLORIDE 0.9% FLUSH: 9.0000 mL | INTRAVENOUS | Status: DC | PRN

## 2021-04-19 MED ORDER — FUROSEMIDE 10 MG/ML IJ SOLN
40.0000 mg | Freq: Two times a day (BID) | INTRAMUSCULAR | Status: DC
Start: 2021-04-19 — End: 2021-04-20
  Administered 2021-04-19 – 2021-04-20 (×2): 40 mg via INTRAVENOUS
  Filled 2021-04-19 (×2): qty 4

## 2021-04-19 MED ORDER — HYDROMORPHONE 1 MG/ML IV SOLN
INTRAVENOUS | Status: DC
  Administered 2021-04-19: 30 mg via INTRAVENOUS
  Administered 2021-04-20 (×2): 0.6 mg via INTRAVENOUS
  Administered 2021-04-20: 0.9 mg via INTRAVENOUS
  Administered 2021-04-20: 30 mg via INTRAVENOUS
  Administered 2021-04-21: 0.3 mg via INTRAVENOUS
  Administered 2021-04-21: 1.5 mg via INTRAVENOUS
  Administered 2021-04-21: 30 mg via INTRAVENOUS
  Administered 2021-04-21: 0.6 mg via INTRAVENOUS
  Administered 2021-04-21 – 2021-04-22 (×4): 30 mg via INTRAVENOUS
  Filled 2021-04-19: qty 30

## 2021-04-19 MED ORDER — TRAVASOL 10 % IV SOLN
INTRAVENOUS | Status: AC
  Filled 2021-04-19: qty 1188

## 2021-04-19 MED ORDER — DIPHENHYDRAMINE HCL 50 MG/ML IJ SOLN
12.5000 mg | Freq: Four times a day (QID) | INTRAMUSCULAR | Status: DC | PRN
  Administered 2021-04-19: 12.5 mg via INTRAVENOUS
  Filled 2021-04-19: qty 1

## 2021-04-19 MED ORDER — NALOXONE HCL 0.4 MG/ML IJ SOLN: 0.4000 mg | INTRAMUSCULAR | Status: DC | PRN

## 2021-04-19 MED ORDER — ONDANSETRON HCL 4 MG/2ML IJ SOLN: 4.0000 mg | Freq: Four times a day (QID) | INTRAMUSCULAR | Status: DC | PRN

## 2021-04-19 MED ORDER — FUROSEMIDE 10 MG/ML IJ SOLN
20.0000 mg | Freq: Two times a day (BID) | INTRAMUSCULAR | Status: DC
Start: 2021-04-19 — End: 2021-04-19
  Administered 2021-04-19: 20 mg via INTRAVENOUS
  Filled 2021-04-19 (×2): qty 2

## 2021-04-19 NOTE — Progress Notes (Signed)
4 Days Post-Op   Subjective/Chief Complaint: Has had more bleeding into ostomy bag Reports pain less   Objective: Vital signs in last 24 hours: Temp:  [98 F (36.7 C)-98.5 F (36.9 C)] 98.1 F (36.7 C) (11/13 0630) Pulse Rate:  [95-123] 105 (11/13 0645) Resp:  [16-25] 22 (11/13 0700) BP: (131-183)/(33-114) 155/52 (11/13 0700) SpO2:  [100 %] 100 % (11/13 0645) Last BM Date: 04/18/21  Intake/Output from previous day: 11/12 0701 - 11/13 0700 In: 3072.5 [I.V.:1864.1; Blood:275; NG/GT:240; IV Piggyback:693.5] Out: 2175 [Urine:100; Emesis/NG output:1100; Drains:325; Stool:650] Intake/Output this shift: No intake/output data recorded.  Exam: Awake and alert Abdomen soft, obese, drain serous, ostomy pink, old blood in bag  Lab Results:  Recent Labs    04/18/21 1800 04/19/21 0214  WBC 22.2* 17.2*  HGB 8.0* 6.9*  HCT 23.7* 21.2*  PLT 334 312   BMET Recent Labs    04/18/21 0230 04/19/21 0214  NA 139 140  K 3.7 4.5  CL 108 112*  CO2 27 25  GLUCOSE 123* 105*  BUN 21* 25*  CREATININE 0.52 0.41*  CALCIUM 7.6* 7.4*   PT/INR No results for input(s): LABPROT, INR in the last 72 hours. ABG No results for input(s): PHART, HCO3 in the last 72 hours.  Invalid input(s): PCO2, PO2  Studies/Results: [place] EKG Site Rite  Result Date: 04/17/2021 If Site Rite image not attached, placement could not be confirmed due to current cardiac rhythm.   Anti-infectives: Anti-infectives (From admission, onward)    Start     Dose/Rate Route Frequency Ordered Stop   04/17/21 1700  ceFEPIme (MAXIPIME) 2 g in sodium chloride 0.9 % 100 mL IVPB        2 g 200 mL/hr over 30 Minutes Intravenous Every 8 hours 04/17/21 1530     04/17/21 1700  metroNIDAZOLE (FLAGYL) IVPB 500 mg        500 mg 100 mL/hr over 60 Minutes Intravenous Every 12 hours 04/17/21 1530     04/15/21 1000  anidulafungin (ERAXIS) 100 mg in sodium chloride 0.9 % 100 mL IVPB        100 mg 78 mL/hr over 100 Minutes  Intravenous Every 24 hours 04/14/21 0744     04/15/21 0900  clindamycin (CLEOCIN) IVPB 900 mg       See Hyperspace for full Linked Orders Report.   900 mg 100 mL/hr over 30 Minutes Intravenous 60 min pre-op 04/14/21 1405 04/15/21 1142   04/15/21 0900  gentamicin (GARAMYCIN) 340 mg in dextrose 5 % 100 mL IVPB       See Hyperspace for full Linked Orders Report.   5 mg/kg  67.7 kg (Adjusted) 108.5 mL/hr over 60 Minutes Intravenous 60 min pre-op 04/14/21 1405 04/15/21 1834   04/14/21 1000  anidulafungin (ERAXIS) 200 mg in sodium chloride 0.9 % 200 mL IVPB        200 mg 78 mL/hr over 200 Minutes Intravenous  Once 04/14/21 0755 04/14/21 1500   04/13/21 0900  meropenem (MERREM) 1 g in sodium chloride 0.9 % 100 mL IVPB  Status:  Discontinued        1 g 200 mL/hr over 30 Minutes Intravenous Every 8 hours 04/13/21 0800 04/17/21 1530   04/10/21 2200  cefTRIAXone (ROCEPHIN) 2 g in sodium chloride 0.9 % 100 mL IVPB  Status:  Discontinued        2 g 200 mL/hr over 30 Minutes Intravenous Daily at bedtime 04/10/21 2044 04/13/21 0746   04/10/21 2200  metroNIDAZOLE (FLAGYL)  IVPB 500 mg  Status:  Discontinued        500 mg 100 mL/hr over 60 Minutes Intravenous Every 12 hours 04/10/21 2044 04/13/21 0745       Assessment/Plan: S/P LAPAROSCOPIC ASSISTED HARTMANN  (LEFT COLECTOMY / END COLOSTOMY), SMALL BOWEL RESECTION, LEFT RETROPERITONEAL / FLANK ABSCESS DRAINAGE 04/15/21 Dr. [name]   Has had further blood in the ostomy bag.  I suspect this is from the small bowel anastomosis which should stop on its own.  This area would not be able to be reached with endoscopy and IR would not be able to embolize.  If surgery required, she would need complete resection of the anastomosis with another anastomosis which would still carry a risk of bleeding.  Continue to hold lovenox  Will check PT/PTT to see if she would benefit from FFP.  Continue NG, TNA, wound VAC Coralie Keens MD 04/19/2021

## 2021-04-19 NOTE — Progress Notes (Signed)
Left upper extremity venous duplex completed. Refer to "CV Proc" under chart review to view preliminary results.  04/19/2021 11:16 AM Kelby Aline., MHA, RVT, RDCS, RDMS

## 2021-04-19 NOTE — Progress Notes (Signed)
PROGRESS NOTE    Mikayla Rios  LSL:[id number] DOB: [birth year] DOA: 04/10/2021 PCP: [name], MD    Chief Complaint  Patient presents with   Weakness   Tachycardia    Brief Narrative:   35 year old female with past medical history of hypertension, inappropriate sinus tachycardia, obesity, obstructive sleep apnea and diverticulosis with complicated diverticulitis and abscess formation 11/2020 status post IR guided placement of 2 left lower quadrant drains who presents to [facility] emergency department with complaints of generalized weakness and lightheadedness. She was found to have new abscess measuring about 8.5 times 10 cm extending into the left lateral abd wall. She underwent drain exchange/upsize on Monday by IR. She subsequently underwent colectomy/colostomy, small bowel resection with retroperitoneal abscess drainage on 11/9. Post op, she was monitored in the ICU on vent and briefly required pressors. She was extubated on 11/10 and has since been weaned off pressors. She was transferred back to [facility] on 11/12  Assessment & Plan:   Principal Problem:   Diverticulitis of large intestine with abscess Active Problems:   Class 3 severe obesity due to excess calories with serious comorbidity and body mass index (BMI) of 60.0 to 69.9 in adult [facility])   Obstructive sleep apnea   Anemia   Inappropriate sinus tachycardia   Essential hypertension   SIRS (systemic inflammatory response syndrome) (HCC)   Hypokalemia, inadequate intake   Protein-calorie malnutrition, severe (HCC)   Reactive thrombocytosis   Prolonged QT interval   Complicated diverticulitis with abscess s/p drain placement by IR twice in June 2022 with numerous exchanges since then due to persistent fistulous -CT of the abdomen and pelvis New lobulated enhancing fluid collection posterior to this segment of inflamed colon measuring 8.5 x 3.5 x 10.0 cm. This collection now invades the adjacent iliopsoas muscle  as well as extends through the left lateral abdominal wall. -Patient has had prolonged treatment with antibiotics and multiple drain placements, but despite conservative measures, she has failed to improve and now has a new abscess -Seen by general surgery and she underwent colectomy/colostomy and drainage of retroperitoneal abscess -Currently on IV cefepime/ flagyl and anidulafungin per surgery -Currently with NG tube in place -She has wound VAC over abdominal wound -Defer further postop care to surgery  Septic shock -Secondary to retroperitoneal abscess -Patient required vasopressor therapy postoperatively, but has been able to wean off pressors -Blood pressures now elevated -She is on broad-spectrum antibiotics  Acute respiratory failure with hypoxia requiring mechanical ventilation -Patient was intubated for operative management and due to hemodynamic instability postoperatively, decision was made to leave the patient intubated and monitored in the ICU -She was able to extubate on 11/10 and respiratory status has been stable  Possible left colon lesion -Noted on CT scan from 02/04/2021 -She had been referred to GI as an outpatient for colonoscopy -This can be pursued on an elective basis once her acute issues have resolved  Pyuria Urine culture with E coli, -She was treated with appropriate antibiotics   Mechanical Fall:  Patient reports dizziness and an episode of syncope and an episode of mechanical fall possibly from orthostatic hypotension.  Monitor on telemetry. CT angiogram of the chest ruled out PE EKG shows prolonged QT interval 636.  Nonspecific T wave inversions.troponin is negative. Patient denies any chest pain or shortness of breath. Probably secondary to orthostatic hypotension from infection. Overall QTC has improved   Hypokalemia Replaced   Prolonged QTc: - from hypokalemia and hypomagnesemia. - K is 4.3. magnesium is  1.8. Keep k >4 and mag>2.   Repeat  EKG shows improvement in the qtC.    Anemia of chronic disease/ Acute anemia of acute illness - normocytic.  -Baseline creatinine appears to be between 7-8 - anemia panel  Shows low folate levels.  -She was transfused a total of 5 units prbc thus far -Hgb down to 6.9 today -additional 2 units prbc ordered today -Continue to follow CBC Transfuse to keep hemoglobin greater than 7. -since she is continuing to have blood through ostomy, checking PT/PTT -will give 2 units FFP with hopes of stopping bleeding   Post op bleeding from ostomy -concern it may be bleeding at small bowel anastomosis -defer further intervention if needed to general surgery -give 2 units FFP today -continue to follow hemoglobin and supportive care with PRBC transfusions   Anasarca -related to hypoalbuminemia and aggressive volume repletion -start on albumin infusion and IV lasix  Hypoalbuminemia  - will get dietary consult.  -currently on TPN since NPO   Sinus tachycardia  Probably from anemia and infection and pain.  Pt not symptomatic.  Echocardiogram unremarkable  Hypertension  Currently on IV lopressor and prn hydralazine   DVT prophylaxis: SCDs Code Status: Full code Family Communication: Discussed with patient at the bedside Disposition:   Status is: Inpatient  Remains inpatient appropriate because: post op management, IV antibiotics, anemia needing transfusion       Consultants:  Surgery IR Gastroenterology Cardiology PCCM  Procedures: Laparoscopic left colectomy/end colostomy, small bowel resection, left retroperitoneal/flank abscess drainage  ETT 11/09>11/10  Antimicrobials:  Antibiotics Given (last 72 hours)     Date/Time Action Medication Dose Rate   04/16/21 1747 New Bag/Given   meropenem (MERREM) 1 g in sodium chloride 0.9 % 100 mL IVPB 1 g 200 mL/hr   04/16/21 2343 New Bag/Given   meropenem (MERREM) 1 g in sodium chloride 0.9 % 100 mL IVPB 1 g 200 mL/hr    04/17/21 1034 New Bag/Given  [PICC line was being exchanged]   meropenem (MERREM) 1 g in sodium chloride 0.9 % 100 mL IVPB 1 g 200 mL/hr   04/17/21 1617 New Bag/Given   ceFEPIme (MAXIPIME) 2 g in sodium chloride 0.9 % 100 mL IVPB 2 g 200 mL/hr   04/17/21 1657 New Bag/Given   metroNIDAZOLE (FLAGYL) IVPB 500 mg 500 mg 100 mL/hr   04/18/21 0008 New Bag/Given   ceFEPIme (MAXIPIME) 2 g in sodium chloride 0.9 % 100 mL IVPB 2 g 200 mL/hr   04/18/21 0440 New Bag/Given   metroNIDAZOLE (FLAGYL) IVPB 500 mg 500 mg 100 mL/hr   04/18/21 1019 New Bag/Given   ceFEPIme (MAXIPIME) 2 g in sodium chloride 0.9 % 100 mL IVPB 2 g 200 mL/hr   04/18/21 1742 New Bag/Given   metroNIDAZOLE (FLAGYL) IVPB 500 mg 500 mg 100 mL/hr   04/18/21 1743 New Bag/Given   ceFEPIme (MAXIPIME) 2 g in sodium chloride 0.9 % 100 mL IVPB 2 g 200 mL/hr   04/19/21 0104 New Bag/Given   ceFEPIme (MAXIPIME) 2 g in sodium chloride 0.9 % 100 mL IVPB 2 g 200 mL/hr   04/19/21 0525 New Bag/Given   metroNIDAZOLE (FLAGYL) IVPB 500 mg 500 mg 100 mL/hr   04/19/21 0846 New Bag/Given   ceFEPIme (MAXIPIME) 2 g in sodium chloride 0.9 % 100 mL IVPB 2 g 200 mL/hr         Subjective: Reports worsening generalized swelling, more so in the left arm. Continues to have bloody output from ostomy. Reports  abd pain is mostly controlled, becomes worse when she is moved. Denies shortness of breath  Objective: Vitals:   04/19/21 0900 04/19/21 0934 04/19/21 0945 04/19/21 1000  BP: 139/68 (!) 155/92  (!) 151/66  Pulse: (!) 105  (!) 101 (!) 102  Resp: (!) 23  (!) 22 (!) 21  Temp:  98.1 F (36.7 C)  98.6 F (37 C)  TempSrc:  Oral  Oral  SpO2: 99%  100% 100%  Weight:      Height:        Intake/Output Summary (Last 24 hours) at 04/19/2021 1028 Last data filed at 04/19/2021 1000 Gross per 24 hour  Intake 3975.12 ml  Output 2100 ml  Net 1875.12 ml   Filed Weights   04/10/21 1436 04/15/21 0808 04/15/21 1800  Weight: 90.7 kg 123.6 kg 99.5 kg     Examination:  General exam: Alert, awake, oriented x 3 Respiratory system: Clear to auscultation. Respiratory effort normal. Cardiovascular system:RRR. No murmurs, rubs, gallops. Gastrointestinal system: Abdomen is nondistended, soft and nontender. No organomegaly or masses felt. Normal bowel sounds heard. Continued dark blood in ostomy Central nervous system: Alert and oriented. No focal neurological deficits. Extremities: edema of upper and lower extremities Skin: No rashes, lesions or ulcers Psychiatry: Judgement and insight appear normal. Mood & affect appropriate.     Data Reviewed: I have personally reviewed following labs and imaging studies  CBC: Recent Labs  Lab 04/16/21 0500 04/17/21 0500 04/17/21 1624 04/18/21 0230 04/18/21 1108 04/18/21 1800 04/19/21 0214  WBC 28.1* 16.6*  --  13.3*  --  22.2* 17.2*  HGB 9.2* 6.4* 7.3* 6.5* 8.0* 8.0* 6.9*  HCT 26.8* 19.4* 21.4* 19.5* 23.7* 23.7* 21.2*  MCV 90.2 92.4  --  89.9  --  89.1 90.2  PLT 372 263  --  250  --  334 147    Basic Metabolic Panel: Recent Labs  Lab 04/15/21 1815 04/16/21 0500 04/17/21 0500 04/18/21 0230 04/19/21 0214  NA 135 132* 137 139 140  K 3.7 3.8 3.5 3.7 4.5  CL 106 101 105 108 112*  CO2 24 23 26 27 25   GLUCOSE 150* 290* 114* 123* 105*  BUN 10 12 16  21* 25*  CREATININE 0.35* 0.59 0.53 0.52 0.41*  CALCIUM 7.4* 7.2* 7.6* 7.6* 7.4*  MG 1.3* 1.7 2.2 2.2 2.0  PHOS 4.5 4.3 3.4 3.6 3.1    GFR: Estimated Creatinine Clearance: 108.2 mL/min (A) (by C-G formula based on SCr of 0.41 mg/dL (L)).  Liver Function Tests: Recent Labs  Lab 04/15/21 1815 04/16/21 0500 04/17/21 0500 04/19/21 0214  AST 22 15 13* 10*  ALT 13 11 9 9   ALKPHOS 71 62 51 47  BILITOT 1.1 0.6 0.5 0.3  PROT 4.2* 4.2* 4.2* 4.0*  ALBUMIN 1.9* 1.7* 2.0* 1.5*    CBG: Recent Labs  Lab 04/18/21 0514 04/18/21 1159 04/18/21 1731 04/18/21 2347 04/19/21 0534  GLUCAP 126* 125* 84 114* 119*     Recent Results (from  the past 240 hour(s))  Resp Panel by RT-PCR (Flu A&B, Covid) Nasopharyngeal Swab     Status: None   Collection Time: 04/10/21  2:46 PM   Specimen: Nasopharyngeal Swab; Nasopharyngeal(NP) swabs in vial transport medium  Result Value Ref Range Status   SARS Coronavirus 2 by RT PCR NEGATIVE NEGATIVE Final    Comment: (NOTE) SARS-CoV-2 target nucleic acids are NOT DETECTED.  The SARS-CoV-2 RNA is generally detectable in upper respiratory specimens during the acute phase of infection. The lowest concentration  of SARS-CoV-2 viral copies this assay can detect is 138 copies/mL. A negative result does not preclude SARS-Cov-2 infection and should not be used as the sole basis for treatment or other patient management decisions. A negative result may occur with  improper specimen collection/handling, submission of specimen other than nasopharyngeal swab, presence of viral mutation(s) within the areas targeted by this assay, and inadequate number of viral copies(<138 copies/mL). A negative result must be combined with clinical observations, patient history, and epidemiological information. The expected result is Negative.  Fact Sheet for Patients:  EntrepreneurPulse.com.au  Fact Sheet for Healthcare Providers:  IncredibleEmployment.be  This test is no t yet approved or cleared by the [place] FDA and  has been authorized for detection and/or diagnosis of SARS-CoV-2 by FDA under an Emergency Use Authorization (EUA). This EUA will remain  in effect (meaning this test can be used) for the duration of the COVID-19 declaration under Section 564(b)(1) of the Act, 21 U.S.C.section 360bbb-3(b)(1), unless the authorization is terminated  or revoked sooner.       Influenza A by PCR NEGATIVE NEGATIVE Final   Influenza B by PCR NEGATIVE NEGATIVE Final    Comment: (NOTE) The Xpert Xpress SARS-CoV-2/FLU/RSV plus assay is intended as an aid in the diagnosis of  influenza from Nasopharyngeal swab specimens and should not be used as a sole basis for treatment. Nasal washings and aspirates are unacceptable for Xpert Xpress SARS-CoV-2/FLU/RSV testing.  Fact Sheet for Patients: EntrepreneurPulse.com.au  Fact Sheet for Healthcare Providers: IncredibleEmployment.be  This test is not yet approved or cleared by the [place] FDA and has been authorized for detection and/or diagnosis of SARS-CoV-2 by FDA under an Emergency Use Authorization (EUA). This EUA will remain in effect (meaning this test can be used) for the duration of the COVID-19 declaration under Section 564(b)(1) of the Act, 21 U.S.C. section 360bbb-3(b)(1), unless the authorization is terminated or revoked.  Performed at [facility], [place] [address] [place], [place] [address]   Blood culture (routine x 2)     Status: None   Collection Time: 04/10/21  4:42 PM   Specimen: BLOOD  Result Value Ref Range Status   Specimen Description   Final    BLOOD RIGHT ANTECUBITAL Performed at [place] [address], [place], [place] [address]    Special Requests   Final    BOTTLES DRAWN AEROBIC AND ANAEROBIC Blood Culture adequate volume Performed at [place] [address] [place], [place] [address]    Culture   Final    NO GROWTH 5 DAYS Performed at [facility], [place] [address] [place], [place] [address]    Report Status 04/15/2021 FINAL  Final  Aerobic/Anaerobic Culture w Gram Stain (surgical/deep wound)     Status: None   Collection Time: 04/10/21  7:56 PM   Specimen: Abscess  Result Value Ref Range Status   Specimen Description   Final    ABSCESS Performed at [place] [address] [place], [place] [address]    Special Requests   Final    NONE Performed at [facility], [place] [address], [place], [place] [address]    Gram  Stain   Final    NO SQUAMOUS EPITHELIAL CELLS SEEN FEW WBC SEEN FEW GRAM POSITIVE COCCI    Culture   Final    FEW ESCHERICHIA COLI FEW ACTINOMYCES ODONTOLYTICUS Standardized susceptibility testing for this organism is not available. MODERATE BACTEROIDES OVATUS  BETA LACTAMASE POSITIVE Performed at [facility], [place] [address], [place], [place] [address]    Report Status 04/13/2021 FINAL  Final   Organism ID, Bacteria ESCHERICHIA COLI  Final      Susceptibility   Escherichia coli - MIC*    AMPICILLIN >=32 RESISTANT Resistant     CEFAZOLIN 8 SENSITIVE Sensitive     CEFEPIME <=0.12 SENSITIVE Sensitive     CEFTAZIDIME <=1 SENSITIVE Sensitive     CEFTRIAXONE <=0.25 SENSITIVE Sensitive     CIPROFLOXACIN >=4 RESISTANT Resistant     GENTAMICIN <=1 SENSITIVE Sensitive     IMIPENEM <=0.25 SENSITIVE Sensitive     TRIMETH/SULFA <=20 SENSITIVE Sensitive     AMPICILLIN/SULBACTAM >=32 RESISTANT Resistant     PIP/TAZO <=4 SENSITIVE Sensitive     * FEW ESCHERICHIA COLI  Urine Culture     Status: Abnormal   Collection Time: 04/11/21  1:09 PM   Specimen: Urine, Clean Catch  Result Value Ref Range Status   Specimen Description   Final    URINE, CLEAN CATCH Performed at [facility], [place] [address] [place], [place] [address]    Special Requests   Final    NONE Performed at [facility], [place] [address], [place], [place] [address]    Culture 70,000 COLONIES/mL ESCHERICHIA COLI (A)  Final   Report Status 04/13/2021 FINAL  Final   Organism ID, Bacteria ESCHERICHIA COLI (A)  Final      Susceptibility   Escherichia coli - MIC*    AMPICILLIN >=32 RESISTANT Resistant     CEFAZOLIN 8 SENSITIVE Sensitive     CEFEPIME <=0.12 SENSITIVE Sensitive     CEFTRIAXONE <=0.25 SENSITIVE Sensitive     CIPROFLOXACIN >=4 RESISTANT Resistant     GENTAMICIN <=1 SENSITIVE Sensitive     IMIPENEM <=0.25 SENSITIVE Sensitive     NITROFURANTOIN <=16 SENSITIVE Sensitive      TRIMETH/SULFA <=20 SENSITIVE Sensitive     AMPICILLIN/SULBACTAM >=32 RESISTANT Resistant     PIP/TAZO <=4 SENSITIVE Sensitive     * 70,000 COLONIES/mL ESCHERICHIA COLI  Surgical PCR screen     Status: None   Collection Time: 04/14/21  4:34 PM   Specimen: Nasal Mucosa; Nasal Swab  Result Value Ref Range Status   MRSA, PCR NEGATIVE NEGATIVE Final   Staphylococcus aureus NEGATIVE NEGATIVE Final    Comment: (NOTE) The Xpert SA Assay (FDA approved for NASAL specimens in patients 45 years of age and older), is one component of a comprehensive surveillance program. It is not intended to diagnose infection nor to guide or monitor treatment. Performed at [facility], [place] [address], [place], [place] [address]           Radiology Studies: No results found.      Scheduled Meds:  sodium chloride   Intravenous Once   chlorhexidine gluconate (MEDLINE KIT)  15 mL Mouth Rinse BID   Chlorhexidine Gluconate Cloth  6 each Topical Daily   furosemide  20 mg Intravenous BID   insulin aspart  0-9 Units Subcutaneous Q6H   lip balm  1 application Topical BID   metoprolol tartrate  5 mg Intravenous Q6H   mupirocin ointment  1 application Nasal BID   pantoprazole (PROTONIX) IV  40 mg Intravenous Daily   Continuous Infusions:  sodium chloride 120 mL/hr at 04/19/21 1000   albumin human     anidulafungin Stopped (04/18/21 1205)   ceFEPime (MAXIPIME) IV Stopped (04/19/21 0916)   methocarbamol (  ROBAXIN) IV     metronidazole Stopped (04/19/21 0625)   TPN ADULT (ION) 90 mL/hr at 04/19/21 1000   TPN ADULT (ION)       LOS: 9 days   Time spent: 35 mins    Kathie Dike, MD [facility]   To contact the attending provider between 7A-7P or the covering provider during after hours 7P-7A, please log into the web site [email or web address] and access using universal [place] password for that web site. If you do not have the password, please call the hospital  operator.  04/19/2021, 10:28 AM

## 2021-04-19 NOTE — Progress Notes (Signed)
Occupational Therapy Treatment Patient Details Name: Mikayla Rios MRN: [id number] DOB: Feb 24, 1986 Today's Date: 04/19/2021   History of present illness Mikayla Rios is a 35 yo female presents with complaints of generalized weakness and lightheadedness. PMH: HTN, sinus tachny, obesity, obstructive sleep apnea and diverticulosis with complicated diverticulitis and abscess formation 11/2020 status post IR guided placement of 2 left lower quadrant drains.  S/PP LAPAROSCOPIC ASSISTED [place]  (LEFT COLECTOMY / END COLOSTOMY), SMALL BOWEL RESECTION, LEFT RETROPERITONEAL / [place] ABSCESS DRAINAGE 04/15/21   OT comments  Patient positioned in chair position to participate in grooming tasks at bed level. Patient noted to have increased pain with movement with HR increased to 125 bpm with modified chair position in bed. Patient's edema in BUE limiting participation in ADL tasks with hand over hand needed to lift LUE to assist with washing face. Patient was positioned with BUE elevated and educated on movements to assist with reducing edema. Patient demonstrated understanding. Patient's discharge plan remains appropriate at this time. OT will continue to follow acutely.     Recommendations for follow up therapy are one component of a multi-disciplinary discharge planning process, led by the attending physician.  Recommendations may be updated based on patient status, additional functional criteria and insurance authorization.    Follow Up Recommendations  Acute inpatient rehab (3hours/day)    Assistance Recommended at Discharge Frequent or constant Supervision/Assistance  Equipment Recommendations  BSC/3in1    Recommendations for Other Services      Precautions / Restrictions Precautions Precautions: Fall Precaution Comments: monitor HR, colostomy, drains, VAC Restrictions Weight Bearing Restrictions: No       Mobility Bed Mobility                    Transfers                          Balance                                           ADL either performed or assessed with clinical judgement   ADL Overall ADL's : Needs assistance/impaired     Grooming: Minimal assistance;Bed level Grooming Details (indicate cue type and reason): patient needed min A to wash face sitting in bed with increased edema in BUE limiting ROM and reported "heaviness" impacting participation in tasks.                                    Extremity/Trunk Assessment Upper Extremity Assessment Upper Extremity Assessment:  (noted to have increased edema in BUE with patient educated on positioning to reduce edema.)       Cervical / Trunk Assessment Cervical / Trunk Assessment: Normal    Vision Patient Visual Report: No change from baseline     Perception     Praxis      Cognition Arousal/Alertness: Awake/alert Behavior During Therapy: WFL for tasks assessed/performed;Flat affect Overall Cognitive Status: Within Functional Limits for tasks assessed                                            Exercises     Shoulder Instructions  General Comments      Pertinent Vitals/ Pain       Pain Assessment: 0-10 Pain Score: 3  Pain Location: abdomen/all over Pain Descriptors / Indicators: Grimacing Pain Intervention(s): Monitored during session;Limited activity within patient's tolerance;Premedicated before session  Home Living                                          Prior Functioning/Environment              Frequency  Min 2X/week        Progress Toward Goals  OT Goals(current goals can now be found in the care plan section)  Progress towards OT goals: OT to reassess next treatment     Plan      Co-evaluation                 AM-PAC OT "6 Clicks" Daily Activity     Outcome Measure   Help from another person eating meals?: Total Help from another person taking care of  personal grooming?: A Lot Help from another person toileting, which includes using toliet, bedpan, or urinal?: Total Help from another person bathing (including washing, rinsing, drying)?: A Lot Help from another person to put on and taking off regular upper body clothing?: Total Help from another person to put on and taking off regular lower body clothing?: Total 6 Click Score: 8    End of Session    OT Visit Diagnosis: Unsteadiness on feet (R26.81);Other abnormalities of gait and mobility (R26.89);History of falling (Z91.81);Muscle weakness (generalized) (M62.81);Pain   Activity Tolerance Patient limited by pain   Patient Left in bed;with call bell/phone within reach;with bed alarm set   Nurse Communication Mobility status        Time: 9935-7017 OT Time Calculation (min): 27 min  Charges: OT General Charges $OT Visit: 1 Visit OT Treatments $Self Care/Home Management : 23-37 mins  Jackelyn Poling OTR/L, [place] Acute Rehabilitation Department Office# [phone] Pager# [phone]   [place] 04/19/2021, 11:39 AM

## 2021-04-19 NOTE — Progress Notes (Signed)
PHARMACY - TOTAL PARENTERAL NUTRITION CONSULT NOTE   Indication:  intolerance to enteral feeding  Patient Measurements: Height: 5\' 3"  (160 cm) Weight: 99.5 kg (219 lb 5.7 oz) IBW/kg (Calculated) : 52.4 TPN AdjBW (KG): 62 Body mass index is 38.86 kg/m. Usual Weight: 90.7  Assessment: 35 yo F s/p laparoscopic assisted Hartmann (left colectomy / end colostomy), small bowel resection, left retroperitoneal / flank abscess drainage on 04/15/21.   Start TPN per pharmacy for nutrition support.  PMH:  hypertension, inappropriate sinus tachycardia, obesity, obstructive sleep apnea and diverticulosis with complicated diverticulitis and abscess formation 11/2020 status post IR guided placement of 2 left lower quadrant drains  Glucose / Insulin: no hx DM; A1c 4.6; no PTA medications. - CBGs 84-125 (goal 150-180) - Dexamethasone 10mg  IV on 11/9 - SSI 1 unit / 24 hrs Electrolytes: K up to 4.5 (s/p replacement and increase, no hemolysis), Cl elevated, others WNL, CorrCa 9.2 - hx prolonged QTC, goal K =>4 and Mg =>2 Renal:  WNL Hepatic: WNL Intake / Output; MIVF: UOP down to 100 ml/24 hrs  - Drains down 325 mls; NG up 1100 mL; Stool down 600 mL - Transfusing, albumin 25g Q6h, no mIVF.  - Lasix 20 BID GI Imaging: GI Surgeries / Procedures:  11/9 Lap Hartmann (left colectomy / end colostomy), small bowel resection, left retroperitoneal / flank abscess drainage  Central access: PICC replaced 11/11 TPN start date: 04/16/21  Nutritional Goals:  Goal TPN rate 90 mL/hr provides 119 g of protein and 2225 kcals per day  RD Assessment: (11/11) Estimated Needs Total Energy Estimated Needs: 2150-2400 kcal Total Protein Estimated Needs: 110-125 grams Total Fluid Estimated Needs: >/= 2.2 L/day  Current Nutrition:  NPO TPN held on 11/11 d/t malpositioned PICC  Plan:  Continue TPN at 90 mL/hr at 1800 Electrolytes in TPN: Na 24mEq/L, K 50 mEq/L, Ca 86mEq/L, Mg 50mEq/L, and Phos 70mmol/L. Cl:Ac 1:2 Add  standard MVI and trace elements to TPN Continue Sensitive q6h SSI and adjust as needed  IVF per MD (none) Monitor TPN labs on Mon/Thurs.  Gretta Arab PharmD, BCPS Clinical Pharmacist [facility] main pharmacy [phone] 04/19/2021 9:47 AM

## 2021-04-19 NOTE — PMR Pre-admission (Signed)
PMR Admission Coordinator Pre-Admission Assessment  Patient: Mikayla Rios is an 35 y.o., female MRN: [id number] DOB: [birth year] Height: _0  (160 cm) Weight: 99.5 kg  Insurance Information HMO:     PPO:   yes   PCP:      IPA:      80/20:      OTHER:  PRIMARY: BCBS      Policy#: [id number]      Subscriber: Pt CM Name:  Malachy Mood      Phone#: [phone]     Fax#: [phone]  I received a call on 04/28/21 from [place] with [facility] approving 14 days for admission 04/29/21 with review due 41/2/87      Pre-Cert#: [id number]       Employer:  Benefits:  Phone #: [phone]     Name:  Irene Shipper Date: 09/05/2020- 04/06/2022 Deductible: $500 ($500 met) OOP Max: $1,500 ($1,500 met) CIR: 80% coverage, 20% co-insurance SNF: 80% coverage, 20% co-insurance; with a limit of 60 visits/cal year (60 remaining) Outpatient:  $20 co-pay; combined 30 visit limit combined/cal yr (30 remaining) Home Health: 80% coverage, 20% co-insurance DME: 80% coverage, 20% co-insurance Providers: in network  SECONDARY:       Policy#:      Phone#:   Development worker, community:       Phone#:   [employer], [employer]" for patients in Inpatient Rehabilitation Facilities with attached "Privacy Act [place] Records" was provided and verbally reviewed with: Patient  Emergency Contact Information Contact Information     Name Relation Home Work Mobile   [place] Spouse [phone]  [phone]   [place] Mother   [phone]       Current Medical History  Patient Admitting Diagnosis: Diverticulitis with abcess History of Present Illness: Mikayla Rios is a 35 year old right-handed female with history of hypertension, obesity with BMI 39.99, inappropriate sinus tachycardia followed by Dr. Gardiner Rhyme.  Per chart review patient lives with spouse.  Two-level home one half bath on main level one-step to entry.  Independent prior to admission.  Patient recently hospitalized [facility]  5/31 until 6/6 for abdominal pain with initial work-up showing perforated diverticulitis.  She was initially admitted to the surgical service treated with intravenous antibiotics and bowel rest.  Patient had worsening leukocytosis repeat CT showed diverticular abscess amenable to drainage and IR consulted and underwent CT-guided drain placement 11/07/2020.  She was discharged to home ambulating extended distances.  Presented 04/10/2021 with progressive generalized weakness and bouts of lightheadedness.  She has had decrease in appetite and gradual weight loss.  She had been scheduled to see gastroenterology 10/6 with eventual plan to undergo endoscopic work-up to rule out any malignancy.  Upon evaluation in the emergency department underwent CT imaging of the chest abdomen pelvis.  CT angiogram of the chest showed no evidence of pulmonary emboli however CT imaging of abdomen and pelvis did reveal new enhancing fluid collection posterior to segment of inflamed colon measuring 8.5 x 3.5 x 10 cm invading the adjacent iliopsoas muscle as well extending through the left lateral abdominal wall.  Posterior drain was noted to be in middle of this abscess.  The anterior drain appeared to be completely dislodged into the subcutaneous tissue.  This did show dramatic change compared to patient's recent CT imaging of the abdomen pelvis 10/28.  Patient also found to have multiple SIRS criteria with concern for early sepsis and therefore was given 1 L bolus of lactated Ringer.  She was hypokalemic with intravenous potassium  chloride initiated.  General surgery as well as interventional radiology and gastroenterology consulted patient did undergo drainage tube exchange 04/13/2021.  She underwent laparoscopic-assisted [place] left colectomy/end colostomy mobilization of splenic flexure of colon and small bowel resection left retroperitoneal/flank abscess drainage for chronic perforated colon with retroperitoneal abscess partial small  bowel obstruction and colon obstruction 04/15/2021 per Dr. Johney Maine and pathology obtained diagnosed pT4NO stage II sigmoid colon cancer.patient did require vasopressor therapy postoperatively and weaned off pressors.  Leukocytosis 12,600 follow-up CT of abdomen 04/29/2021 showed continued left retroperitoneal abscess inferior to the left kidney overall size decreased since prior study.  New fluid collection in the cul-de-sac and wrapping around the uterus concerning for abscess reviewed by gynecology and did not feel clinically significant amount of fluid that would need intervention and patient currently is maintained on empiric meropenem changed to Cipro and Flagyl 1125 2022 x 5 days..  Wound care ongoing with colostomy in place as well as right JP drain and wound VAC.  Required mechanical ventilation postoperatively for acute respiratory failure extubated 11/10.  Bouts of prolonged QTC from hypokalemia and hypomagnesemia with supplement added.  Repeat EKG showed improvement in the QTC.  Echocardiogram with ejection fraction of 60 to 65% no wall motion abnormalities.  Dr [name] of medical oncology consulted.  Plan is for adjuvant chemotherapy FOLFOX or Capox for 3 to 6 months once recovered from latest surgery and rehabilitation.  Hospital course acute blood loss anemia transfused 2 units packed red blood cells FFP 04/19/2021 and latest hemoglobin 10.8.  Anasarca related to hypoalbuminemia and aggressive volume repletion she did receive diuresis.  She did receive TPN for nutritional support.  She is now tolerating a regular consistency diet.  Therapy evaluations completed due to patient's decreased functional mobility was admitted for a comprehensive rehab program.  Patient's medical record from [facility] has been reviewed by the rehabilitation admission coordinator and physician.  Past Medical History  Past Medical History:  Diagnosis Date   Allergy    seasonal   Anemia    Blood infection 1985    Blood transfusion without reported diagnosis    Diverticulitis    Hypertension    Obesity    Sleep apnea     Has the patient had major surgery during 100 days prior to admission? Yes  Family History   family history includes Autism in her son; Cancer in her mother, paternal grandfather, and paternal uncle; Colon cancer in her mother; Congestive Heart Failure in her father; Diabetes in her maternal grandfather, maternal grandmother, mother, and paternal grandmother; Heart disease in her paternal aunt; Hypertension in her father, maternal grandmother, and mother; Kidney disease in her maternal grandfather.  Current Medications  Current Facility-Administered Medications:    0.9 %  sodium chloride infusion (Manually program via Guardrails IV Fluids), , Intravenous, Once, Memon, Jolaine Artist, MD   0.9 %  sodium chloride infusion, , Intravenous, PRN, Kathie Dike, MD, Last Rate: 150 mL/hr at 04/19/21 0700, Infusion Verify at 04/19/21 0700   albumin human 25 % solution 25 g, 25 g, Intravenous, Q6H, Memon, Jehanzeb, MD   anidulafungin (ERAXIS) 100 mg in sodium chloride 0.9 % 100 mL IVPB, 100 mg, Intravenous, Q24H, Simaan, Elizabeth S, PA-C, Stopped at 04/18/21 1205   ceFEPIme (MAXIPIME) 2 g in sodium chloride 0.9 % 100 mL IVPB, 2 g, Intravenous, Q8H, Simaan, Elizabeth S, PA-C, Last Rate: 200 mL/hr at 04/19/21 0846, 2 g at 04/19/21 0846   chlorhexidine gluconate (MEDLINE KIT) (PERIDEX) 0.12 % solution  15 mL, 15 mL, Mouth Rinse, BID, Kathie Dike, MD, 15 mL at 04/19/21 0834   Chlorhexidine Gluconate Cloth 2 % PADS 6 each, 6 each, Topical, Daily, Jill Alexanders, PA-C, 6 each at 04/18/21 2231   furosemide (LASIX) injection 20 mg, 20 mg, Intravenous, BID, Memon, Jolaine Artist, MD   hydrALAZINE (APRESOLINE) injection 10 mg, 10 mg, Intravenous, Q6H PRN, Kathie Dike, MD, 10 mg at 04/18/21 1644   HYDROmorphone (DILAUDID) injection 0.5-1 mg, 0.5-1 mg, Intravenous, Q4H PRN, Noemi Chapel P, DO, 1 mg at  04/19/21 0848   insulin aspart (novoLOG) injection 0-9 Units, 0-9 Units, Subcutaneous, Q6H, Bell, Ronaldo Miyamoto, RPH, 1 Units at 04/18/21 1210   labetalol (NORMODYNE) injection 10-20 mg, 10-20 mg, Intravenous, Q2H PRN, Frederik Pear, MD, 20 mg at 04/19/21 0834   lip balm (CARMEX) ointment 1 application, 1 application, Topical, BID, Simaan, Elizabeth S, PA-C, 1 application at 38/75/64 2230   magic mouthwash, 15 mL, Oral, QID PRN, Simaan, Elizabeth S, PA-C   methocarbamol (ROBAXIN) 1,000 mg in dextrose 5 % 100 mL IVPB, 1,000 mg, Intravenous, Q6H PRN, Simaan, Elizabeth S, PA-C   metoprolol tartrate (LOPRESSOR) injection 5 mg, 5 mg, Intravenous, Q6H, Memon, Jolaine Artist, MD, 5 mg at 04/19/21 0527   metroNIDAZOLE (FLAGYL) IVPB 500 mg, 500 mg, Intravenous, Q12H, Simaan, Elizabeth S, PA-C, Stopped at 04/19/21 3329   mupirocin ointment (BACTROBAN) 2 % 1 application, 1 application, Nasal, BID, Simaan, Elizabeth S, PA-C, 1 application at 51/88/41 2230   pantoprazole (PROTONIX) injection 40 mg, 40 mg, Intravenous, Daily, Simaan, Elizabeth S, PA-C, 40 mg at 04/18/21 1021   phenol (CHLORASEPTIC) mouth spray 2 spray, 2 spray, Mouth/Throat, PRN, Simaan, Elizabeth S, PA-C   prochlorperazine (COMPAZINE) injection 5-10 mg, 5-10 mg, Intravenous, Q4H PRN, Simaan, Elizabeth S, PA-C   sodium chloride flush (NS) 0.9 % injection 10-40 mL, 10-40 mL, Intracatheter, PRN, Simaan, Elizabeth S, PA-C, 10 mL at 04/18/21 2230   TPN ADULT (ION), , Intravenous, Continuous TPN, Shade, Haze Justin, RPH, Last Rate: 90 mL/hr at 04/19/21 0700, Infusion Verify at 04/19/21 0700  Patients Current Diet:  Diet Order             Diet NPO time specified Except for: Ice Chips  Diet effective now                   Precautions / Restrictions Precautions Precautions: Fall Precaution Comments: monitor HR, colostomy, drains, VAC Restrictions Weight Bearing Restrictions: No   Has the patient had 2 or more falls or a fall with injury in  the past year? No  Prior Activity Level  Pt. Went out for appointments only in the 4 months PTA  Prior Functional Level Self Care: Did the patient need help bathing, dressing, using the toilet or eating? Needed some help  Indoor Mobility: Did the patient need assistance with walking from room to room (with or without device)? Needed some help  Stairs: Did the patient need assistance with internal or external stairs (with or without device)? Needed some help  Functional Cognition: Did the patient need help planning regular tasks such as shopping or remembering to take medications? Independent  Patient Information Are you of Hispanic, Latino/a,or Spanish origin?: A. No, not of Hispanic, Latino/a, or Spanish origin What is your race?: B. Black or African American Do you need or want an interpreter to communicate with a doctor or health care staff?: 0. No  Patient's Response To:  Health Literacy and Transportation Is the patient able to respond  to health literacy and transportation needs?: Yes Health Literacy - How often do you need to have someone help you when you read instructions, pamphlets, or other written material from your doctor or pharmacy?: Never In the past 12 months, has lack of transportation kept you from medical appointments or from getting medications?: No In the past 12 months, has lack of transportation kept you from meetings, work, or from getting things needed for daily living?: No  Home Assistive Devices / [place] Devices/Equipment: CPAP (has cpap but doesn't use it) Home Equipment: None  Prior Device Use: Indicate devices/aids used by the patient prior to current illness, exacerbation or injury? None of the above  Current Functional Level Cognition  Overall Cognitive Status: Within Functional Limits for tasks assessed Orientation Level: Oriented X4 General Comments: pt pleasant, follows commands appropriately, lethargy suspect to morphine     Extremity Assessment (includes Sensation/Coordination)  Upper Extremity Assessment: Defer to OT evaluation  Lower Extremity Assessment: Generalized weakness (symmetrical)    ADLs  Overall ADL's : Needs assistance/impaired Eating/Feeding: Set up, Sitting Grooming: Set up, Sitting Upper Body Bathing: Set up, Sitting Lower Body Bathing: Maximal assistance, Sitting/lateral leans Upper Body Dressing : Set up, Sitting Lower Body Dressing: Total assistance, Sit to/from stand Lower Body Dressing Details (indicate cue type and reason): reliant on walker to stand neding total assistance for all clothing. Toilet Transfer: Min guard, BSC, Squat-pivot, Rolling walker (2 wheels) Toileting- Clothing Manipulation and Hygiene: Total assistance, Sit to/from stand Toileting - Clothing Manipulation Details (indicate cue type and reason): patient reliant on UEs on walker - needs assistance for hygiene and clothing management. Functional mobility during ADLs: Min guard, Rolling walker (2 wheels) General ADL Comments: min guard for sit to stand and only able to take steps to reclner    Mobility  Overal bed mobility: Needs Assistance Bed Mobility: Sit to Supine Supine to sit: Min assist, HOB elevated Sit to supine: Mod assist, +2 for physical assistance General bed mobility comments: deferred    Transfers  Overall transfer level: Needs assistance Equipment used: Rolling walker (2 wheels) Transfers: Sit to/from Stand, Stand Pivot Transfers Sit to Stand: Mod assist, +2 physical assistance Stand pivot transfers: Min guard, +2 safety/equipment General transfer comment: deferred    Ambulation / Gait / Stairs / Wheelchair Mobility  Ambulation/Gait General Gait Details: HR up to 171 with stand pivot transfer and 2-3 steps at bedside- will hold until more appropriate    Posture / Balance Dynamic Sitting Balance Sitting balance - Comments: seated EOB Balance Overall balance assessment: Needs  assistance Sitting-balance support: No upper extremity supported Sitting balance-Leahy Scale: Fair Sitting balance - Comments: seated EOB Standing balance support: Bilateral upper extremity supported, Reliant on assistive device for balance, During functional activity Standing balance-Leahy Scale: Poor    Special needs/care consideration Skin surgical incision, cracking on bilateral feet, ecchymosis on BUEs, Excoriation on L chest.  and Special service needs wound  vac   Previous Home Environment (from acute therapy documentation) Living Arrangements: Spouse/significant other, Children  Lives With: Spouse Available Help at Discharge: Family, Available PRN/intermittently Type of Home:  (townhome) Home Layout: Two level, 1/2 bath on main level Alternate Level Stairs-Number of Steps: 14 Home Access: Stairs to enter [employer] of Steps: 1 Bathroom Shower/Tub: Optometrist: No Home Care Services: No  Discharge Living Setting Plans for Discharge Living Setting: Patient's home Type of Home at Discharge: House Discharge Home Layout: Two level, 1/2 bath on main  level Alternate Level Stairs-Rails: Right Alternate Level Stairs-Number of Steps: 14 Discharge Home Access: Stairs to enter Entrance Stairs-Rails: None Entrance Stairs-Number of Steps: 1 Discharge Bathroom Shower/Tub: Tub/shower unit Discharge Bathroom Toilet: Standard Discharge Bathroom Accessibility: No Does the patient have any problems obtaining your medications?: No  Social/Family/Support Systems Patient Roles: Spouse Contact Information: [phone] Anticipated Caregiver: Trilby Leaver Anticipated Caregiver's Contact Information: [phone] Ability/Limitations of Caregiver: Can provide Min A Caregiver Availability: 24/7 Discharge Plan Discussed with Primary Caregiver: Yes Is Caregiver In Agreement with Plan?: Yes Does Caregiver/Family have Issues with  Lodging/Transportation while Pt is in Rehab?: Yes  Goals Patient/Family Goal for Rehab: PT/OT Min A Expected length of stay: 7-10 days Pt/Family Agrees to Admission and willing to participate: Yes Program Orientation Provided & Reviewed with Pt/Caregiver Including Roles  & Responsibilities: Yes  Decrease burden of Care through IP rehab admission: Specialzed equipment needs, Decrease number of caregivers, Bowel and bladder program, and Patient/family education  Possible need for SNF placement upon discharge: not anticipated  Patient Condition: I have reviewed medical record from [facility], spoken with CM, and patient and spouse. I met with patient at the bedside for inpatient rehabilitation assessment.  Patient will benefit from ongoing PT and OT, can actively participate in 3 hours of therapy a day 5 days of the week, and can make measurable gains during the admission.  Patient will also benefit from the coordinated team approach during an Inpatient Acute Rehabilitation admission.  The patient will receive intensive therapy as well as Rehabilitation physician, nursing, social worker, and care management interventions.  Due to safety, skin/wound care, disease management, medication administration, pain management, and patient education the patient requires 24 hour a day rehabilitation nursing.  The patient is currently min-mod A with mobility and basic ADLs.  Discharge setting and therapy post discharge at home with home health is anticipated.  Patient has agreed to participate in the Acute Inpatient Rehabilitation Program and will admit today.  Preadmission Screen Completed By:  Genella Mech, 04/19/2021 9:52 AM ______________________________________________________________________   Discussed status with Dr. Letta Pate on 05/01/21  at 1000 and received approval for admission today.  Admission Coordinator:  Genella Mech, CCC-SLP, time 1100/Date 05/01/21    Assessment/Plan: Diagnosis:Debility related to colon carcinoma Does the need for close, 24 hr/day Medical supervision in concert with the patient's rehab needs make it unreasonable for this patient to be served in a less intensive setting? Yes Co-Morbidities requiring supervision/potential complications: MOrbid obesity, chronic sinus tachycardia, Bowel perforation, s/p colostomy, abd infection s/p wound vac Due to bladder management, safety, skin/wound care, disease management, medication administration, pain management, patient education, and colostomy care , does the patient require 24 hr/day rehab nursing? Yes Does the patient require coordinated care of a physician, rehab nurse, PT, OT, and SLP to address physical and functional deficits in the context of the above medical diagnosis(es)? Yes Addressing deficits in the following areas: balance, endurance, locomotion, strength, transferring, bowel/bladder control, bathing, dressing, feeding, toileting, and psychosocial support Can the patient actively participate in an intensive therapy program of at least 3 hrs of therapy 5 days a week? Yes The potential for patient to make measurable gains while on inpatient rehab is excellent Anticipated functional outcomes upon discharge from inpatient rehab: modified independent PT, modified independent OT, modified independent SLP Estimated rehab length of stay to reach the above functional goals is: 7-10d Anticipated discharge destination: Home 10. Overall Rehab/Functional Prognosis: excellent   MD Signature: Charlett Blake M.D. [place]  Group Fellow Am Acad of Phys Med and Rehab Diplomate Am Board of Electrodiagnostic Med Fellow Am Board of Interventional Pain

## 2021-04-19 NOTE — Progress Notes (Signed)
Inpatient Rehab Admissions Coordinator:   I spoke with Pt. And husband regarding potential CIR admit. They state interest. Pt.'s husband works so they will reach  out to her parents to see if they are able to provide support during days when her husband is working.  Pt. Is not yet medically stable for CIR but I will follow for potential admit pending insurance auth, bed availability, and medical readiness.  Clemens Catholic, [place], [place] Admissions Coordinator  [phone] ([place]) [phone] (office)

## 2021-04-20 DIAGNOSIS — K5720 Diverticulitis of large intestine with perforation and abscess without bleeding: Secondary | ICD-10-CM | POA: Diagnosis not present

## 2021-04-20 DIAGNOSIS — J9601 Acute respiratory failure with hypoxia: Secondary | ICD-10-CM | POA: Diagnosis not present

## 2021-04-20 DIAGNOSIS — I10 Essential (primary) hypertension: Secondary | ICD-10-CM | POA: Diagnosis not present

## 2021-04-20 DIAGNOSIS — D649 Anemia, unspecified: Secondary | ICD-10-CM | POA: Diagnosis not present

## 2021-04-20 LAB — BPAM FFP
Blood Product Expiration Date: 202211182359
Blood Product Expiration Date: 202211182359
ISSUE DATE / TIME: 202211131832
ISSUE DATE / TIME: 202211132004
Unit Type and Rh: 6200
Unit Type and Rh: 6200

## 2021-04-20 LAB — PREPARE FRESH FROZEN PLASMA: Unit division: 0

## 2021-04-20 LAB — CBC
HCT: 23 % — ABNORMAL LOW (ref 36.0–46.0)
HCT: 21.9 % — ABNORMAL LOW (ref 36.0–46.0)
HCT: 23.7 % — ABNORMAL LOW (ref 36.0–46.0)
Hemoglobin: 7.9 g/dL — ABNORMAL LOW (ref 12.0–15.0)
Hemoglobin: 7.5 g/dL — ABNORMAL LOW (ref 12.0–15.0)
Hemoglobin: 7.7 g/dL — ABNORMAL LOW (ref 12.0–15.0)
MCH: 30.5 pg (ref 26.0–34.0)
MCH: 30.6 pg (ref 26.0–34.0)
MCH: 29.5 pg (ref 26.0–34.0)
MCHC: 34.3 g/dL (ref 30.0–36.0)
MCHC: 34.2 g/dL (ref 30.0–36.0)
MCHC: 32.5 g/dL (ref 30.0–36.0)
MCV: 88.8 fL (ref 80.0–100.0)
MCV: 89.4 fL (ref 80.0–100.0)
MCV: 90.8 fL (ref 80.0–100.0)
Platelets: 326 10*3/uL (ref 150–400)
Platelets: 313 10*3/uL (ref 150–400)
Platelets: 361 10*3/uL (ref 150–400)
RBC: 2.59 MIL/uL — ABNORMAL LOW (ref 3.87–5.11)
RBC: 2.45 MIL/uL — ABNORMAL LOW (ref 3.87–5.11)
RBC: 2.61 MIL/uL — ABNORMAL LOW (ref 3.87–5.11)
RDW: 17.9 % — ABNORMAL HIGH (ref 11.5–15.5)
RDW: 18 % — ABNORMAL HIGH (ref 11.5–15.5)
RDW: 17.4 % — ABNORMAL HIGH (ref 11.5–15.5)
WBC: 13.3 10*3/uL — ABNORMAL HIGH (ref 4.0–10.5)
WBC: 12.1 10*3/uL — ABNORMAL HIGH (ref 4.0–10.5)
WBC: 13.4 10*3/uL — ABNORMAL HIGH (ref 4.0–10.5)
nRBC: 0 % (ref 0.0–0.2)
nRBC: 0 % (ref 0.0–0.2)
nRBC: 0 % (ref 0.0–0.2)

## 2021-04-20 LAB — COMPREHENSIVE METABOLIC PANEL
ALT: 9 U/L (ref 0–44)
AST: 10 U/L — ABNORMAL LOW (ref 15–41)
Albumin: 2.7 g/dL — ABNORMAL LOW (ref 3.5–5.0)
Alkaline Phosphatase: 43 U/L (ref 38–126)
Anion gap: 7 (ref 5–15)
BUN: 20 mg/dL (ref 6–20)
CO2: 26 mmol/L (ref 22–32)
Calcium: 8.5 mg/dL — ABNORMAL LOW (ref 8.9–10.3)
Chloride: 105 mmol/L (ref 98–111)
Creatinine, Ser: 0.36 mg/dL — ABNORMAL LOW (ref 0.44–1.00)
GFR, Estimated: >60 mL/min (ref >60)
Glucose, Bld: 432 mg/dL — ABNORMAL HIGH (ref 70–99)
Potassium: 5.3 mmol/L — ABNORMAL HIGH (ref 3.5–5.1)
Sodium: 138 mmol/L (ref 135–145)
Total Bilirubin: 0.5 mg/dL (ref 0.3–1.2)
Total Protein: 4.9 g/dL — ABNORMAL LOW (ref 6.5–8.1)

## 2021-04-20 LAB — GLUCOSE, CAPILLARY
Glucose-Capillary: 109 mg/dL — ABNORMAL HIGH (ref 70–99)
Glucose-Capillary: 102 mg/dL — ABNORMAL HIGH (ref 70–99)
Glucose-Capillary: 105 mg/dL — ABNORMAL HIGH (ref 70–99)
Glucose-Capillary: 104 mg/dL — ABNORMAL HIGH (ref 70–99)

## 2021-04-20 LAB — LACTIC ACID, PLASMA
Lactic Acid, Venous: 1.1 mmol/L (ref 0.5–1.9)
Lactic Acid, Venous: 1 mmol/L (ref 0.5–1.9)

## 2021-04-20 LAB — MAGNESIUM: Magnesium: 2 mg/dL (ref 1.7–2.4)

## 2021-04-20 LAB — PHOSPHORUS: Phosphorus: 4.8 mg/dL — ABNORMAL HIGH (ref 2.5–4.6)

## 2021-04-20 LAB — TRIGLYCERIDES: Triglycerides: 246 mg/dL — ABNORMAL HIGH (ref <150)

## 2021-04-20 MED ORDER — STERILE WATER FOR INJECTION IJ SOLN
INTRAMUSCULAR | Status: AC
  Filled 2021-04-20: qty 10

## 2021-04-20 MED ORDER — MUPIROCIN 2 % EX OINT: 1.0000 "application " | TOPICAL_OINTMENT | Freq: Two times a day (BID) | CUTANEOUS | Status: DC

## 2021-04-20 MED ORDER — METOPROLOL TARTRATE 5 MG/5ML IV SOLN
5.0000 mg | INTRAVENOUS | Status: DC
  Administered 2021-04-20 – 2021-04-22 (×12): 5 mg via INTRAVENOUS
  Filled 2021-04-20 (×11): qty 5

## 2021-04-20 MED ORDER — ALTEPLASE 2 MG IJ SOLR
2.0000 mg | Freq: Once | INTRAMUSCULAR | Status: AC
  Administered 2021-04-20: 2 mg
  Filled 2021-04-20: qty 2

## 2021-04-20 MED ORDER — TRAVASOL 10 % IV SOLN
INTRAVENOUS | Status: AC
  Filled 2021-04-20: qty 1188

## 2021-04-20 MED ORDER — METOPROLOL TARTRATE 5 MG/5ML IV SOLN
5.0000 mg | INTRAVENOUS | Status: DC
  Filled 2021-04-20: qty 5

## 2021-04-20 MED ORDER — FUROSEMIDE 10 MG/ML IJ SOLN
20.0000 mg | Freq: Two times a day (BID) | INTRAMUSCULAR | Status: DC
  Administered 2021-04-20 – 2021-04-23 (×6): 20 mg via INTRAVENOUS
  Filled 2021-04-20 (×5): qty 2

## 2021-04-20 NOTE — Progress Notes (Signed)
Pt HR is sustained in the 130's and SBP>170. PRN Hydralazine 10mg  given, then Scheduled Lopressor 5mg  IVP administered without improvement of BP. PT is asymptomatic and has PCA pump on board for pain. Provider notified. See orders.   04/20/21 0000  Vitals  Temp 98 F (36.7 C)  Temp Source Oral  BP (!) 172/69  MAP (mmHg) 96  BP Location Left Leg  BP Method Automatic  Patient Position (if appropriate) Lying  Pulse Rate (!) 133  ECG Heart Rate (!) 132  Resp (!) 27  Oxygen Therapy  SpO2 100 %  End [place] CO2 (EtCO2) 17  Pain Assessment  Pain Scale 0-10  Pain Score 5  Patients Stated Pain Goal 2  POSS Scale ([place] Opioid Sedation Scale)  POSS *See Group Information* S-Acceptable,Sleep, easy to arouse  MEWS Score  MEWS Temp 0  MEWS Systolic 0  MEWS Pulse 3  MEWS RR 2  MEWS LOC 0  MEWS Score 5  MEWS Score Color Red

## 2021-04-20 NOTE — Plan of Care (Signed)
  Problem: Education: Goal: Knowledge of General Education information will improve Description: Including pain rating scale, medication(s)/side effects and non-pharmacologic comfort measures Outcome: Progressing   Problem: Clinical Measurements: Goal: Respiratory complications will improve Outcome: Progressing   Problem: Pain Managment: Goal: General experience of comfort will improve Outcome: Progressing   

## 2021-04-20 NOTE — Progress Notes (Signed)
PHARMACY - TOTAL PARENTERAL NUTRITION CONSULT NOTE   Indication:  intolerance to enteral feeding  Patient Measurements: Height: 5\' 3"  (160 cm) Weight: 99.5 kg (219 lb 5.7 oz) IBW/kg (Calculated) : 52.4 TPN AdjBW (KG): 62 Body mass index is 38.86 kg/m. Usual Weight: 90.7  Assessment: 35 yo F s/p laparoscopic assisted Hartmann (left colectomy / end colostomy), small bowel resection, left retroperitoneal / flank abscess drainage on 04/15/21.   Start TPN per pharmacy for nutrition support.  PMH:  hypertension, inappropriate sinus tachycardia, obesity, obstructive sleep apnea and diverticulosis with complicated diverticulitis and abscess formation 11/2020 status post IR guided placement of 2 left lower quadrant drains  Glucose / Insulin: no hx DM; A1c 4.6; no PTA medications. - CBGs 105-125 except 432 with chemistry (suspect erroneous) (goal 150-180) - Dexamethasone 10mg  IV on 11/9 - SSI 0 unit / 24 hrs Electrolytes: K up to 5.3 (despite reduction 11/13, no hemolysis), phos 4.8, others WNL, CorrCa 9.5 - hx prolonged QTC, goal K =>4 and Mg =>2 Renal:  WNL Hepatic: LFTs WNL, TG 246 Intake / Output; MIVF: UOP 4750 ml/24 hrs  - Drains down 220 mls; NG up 1500 mL; Stool down 550 mL - Transfusing, albumin 25g Q6h, no mIVF.  - Lasix 20 BID GI Imaging: GI Surgeries / Procedures:  11/9 Lap Hartmann (left colectomy / end colostomy), small bowel resection, left retroperitoneal / flank abscess drainage  Central access: PICC replaced 11/11 TPN start date: 04/16/21  Nutritional Goals:  Goal TPN rate 90 mL/hr provides 119 g of protein and 2225 kcals per day  RD Assessment: (11/11) Estimated Needs Total Energy Estimated Needs: 2150-2400 kcal Total Protein Estimated Needs: 110-125 grams Total Fluid Estimated Needs: >/= 2.2 L/day  Current Nutrition:  NPO TPN held on 11/11 d/t malpositioned PICC  Plan:  Continue TPN at 90 mL/hr at 1800 Electrolytes in TPN: Na 60mEq/L, K 0 mEq/L, Ca 60mEq/L, Mg  67mEq/L, and Phos 0 mmol/L. Cl:Ac 1:2 Add standard MVI and trace elements to TPN Continue Sensitive q6h SSI and adjust as needed  IVF per MD (none) Monitor TPN labs on Mon/Thurs. BMET, Mg, and Phos in AM  Mikayla Rios D  04/20/2021 9:24 AM

## 2021-04-20 NOTE — Progress Notes (Signed)
Assessment & Plan: POD#5 - S/P LAPAROSCOPIC ASSISTED HARTMANN  (LEFT COLECTOMY / END COLOSTOMY), SMALL BOWEL RESECTION, LEFT RETROPERITONEAL / FLANK ABSCESS DRAINAGE 04/15/21 Dr. [name]   NPO, NG, TNA  Wound VAC changed this AM  Hgb ~stable at 7.5 this AM - no blood in ostomy bag  Encouraged OOB to chair today  Path pending        [name], MD       [facility]       Office: [phone]   Chief Complaint: Colonic perforation  Subjective: Patient in bed, nurse at bedside.  VAC changed this AM.  Objective: Vital signs in last 24 hours: Temp:  [98 F (36.7 C)-98.8 F (37.1 C)] 98.1 F (36.7 C) (11/14 0344) Pulse Rate:  [101-134] 130 (11/14 0800) Resp:  [20-35] 22 (11/14 0822) BP: (136-179)/(60-116) 146/83 (11/14 0800) SpO2:  [98 %-100 %] 99 % (11/14 0800) FiO2 (%):  [0 %] 0 % (11/14 0309) Last BM Date: 04/19/21  Intake/Output from previous day: 11/13 0701 - 11/14 0700 In: 5795.2 [I.V.:2618.8; Blood:1279.3; NG/GT:360; IV Piggyback:1537.1] Out: 7020 [Urine:4750; Emesis/NG output:1500; Drains:220; Stool:550] Intake/Output this shift: Total I/O In: -  Out: 30 [Stool:30]  Physical Exam: HEENT - sclerae clear, mucous membranes moist Neck - soft Chest - clear bilaterally Cor - RRR Abdomen - obese; VAC intact; JP with thin serosanguinous output; stoma viable, edematous, no blood in ostomy bag Neuro - alert & oriented, no focal deficits  Lab Results:  Recent Labs    04/20/21 0053 04/20/21 0500  WBC 13.3* 12.1*  HGB 7.9* 7.5*  HCT 23.0* 21.9*  PLT 326 313   BMET Recent Labs    04/18/21 0230 04/19/21 0214  NA 139 140  K 3.7 4.5  CL 108 112*  CO2 27 25  GLUCOSE 123* 105*  BUN 21* 25*  CREATININE 0.52 0.41*  CALCIUM 7.6* 7.4*   PT/INR Recent Labs    04/19/21 1500  LABPROT 15.0  INR 1.2   Comprehensive Metabolic Panel:    Component Value Date/Time   NA 140 04/19/2021 0214   NA 139 04/18/2021 0230   NA 139 12/21/2019  1024   NA 137 10/02/2019 1127   K 4.5 04/19/2021 0214   K 3.7 04/18/2021 0230   CL 112 (H) 04/19/2021 0214   CL 108 04/18/2021 0230   CO2 25 04/19/2021 0214   CO2 27 04/18/2021 0230   BUN 25 (H) 04/19/2021 0214   BUN 21 (H) 04/18/2021 0230   BUN 13 12/21/2019 1024   BUN 11 10/02/2019 1127   CREATININE 0.41 (L) 04/19/2021 0214   CREATININE 0.52 04/18/2021 0230   GLUCOSE 105 (H) 04/19/2021 0214   GLUCOSE 123 (H) 04/18/2021 0230   CALCIUM 7.4 (L) 04/19/2021 0214   CALCIUM 7.6 (L) 04/18/2021 0230   AST 10 (L) 04/19/2021 0214   AST 13 (L) 04/17/2021 0500   ALT 9 04/19/2021 0214   ALT 9 04/17/2021 0500   ALKPHOS 47 04/19/2021 0214   ALKPHOS 51 04/17/2021 0500   BILITOT 0.3 04/19/2021 0214   BILITOT 0.5 04/17/2021 0500   BILITOT 0.2 07/11/2019 1638   BILITOT 0.2 07/25/2018 1150   PROT 4.0 (L) 04/19/2021 0214   PROT 4.2 (L) 04/17/2021 0500   PROT 7.4 07/11/2019 1638   PROT 7.3 07/25/2018 1150   ALBUMIN 1.5 (L) 04/19/2021 0214   ALBUMIN 2.0 (L) 04/17/2021 0500   ALBUMIN 4.6 07/11/2019 1638   ALBUMIN 4.3 07/25/2018 1150  Studies/Results: VAS [place] UPPER EXTREMITY VENOUS DUPLEX  Result Date: 04/19/2021 UPPER VENOUS STUDY  Patient Name:  Mikayla Rios Silber  Date of Exam:   04/19/2021 Medical Rec #: [id number]       Accession #:    [id number] Date of Birth: 06/22/1985        Patient Gender: F Patient Age:   60 years Exam Location:  [facility] Procedure:      VAS [place] UPPER EXTREMITY VENOUS DUPLEX Referring Phys: Jolaine Artist MEMON --------------------------------------------------------------------------------  Indications: Edema Limitations: Poor ultrasound/tissue interface and body habitus. Comparison Study: No prior study Performing Technologist: Maudry Mayhew MHA, RDMS, RVT, RDCS  Examination Guidelines: A complete evaluation includes B-mode imaging, spectral Doppler, color Doppler, and power Doppler as needed of all accessible portions of each vessel. Bilateral testing is  considered an integral part of a complete examination. Limited examinations for reoccurring indications may be performed as noted.  Right Findings: +----------+------------+---------+-----------+----------+-------+ RIGHT     CompressiblePhasicitySpontaneousPropertiesSummary +----------+------------+---------+-----------+----------+-------+ Subclavian               Yes       Yes                      +----------+------------+---------+-----------+----------+-------+  Left Findings: +----------+------------+---------+-----------+----------+--------------+ LEFT      CompressiblePhasicitySpontaneousProperties   Summary     +----------+------------+---------+-----------+----------+--------------+ IJV           Full       Yes       Yes                             +----------+------------+---------+-----------+----------+--------------+ Subclavian    Full       Yes       Yes                             +----------+------------+---------+-----------+----------+--------------+ Axillary      Full       Yes       Yes                             +----------+------------+---------+-----------+----------+--------------+ Brachial      Full       Yes       Yes                             +----------+------------+---------+-----------+----------+--------------+ Radial        Full                                                 +----------+------------+---------+-----------+----------+--------------+ Ulnar         Full                                                 +----------+------------+---------+-----------+----------+--------------+ Cephalic      Full                                                 +----------+------------+---------+-----------+----------+--------------+  Basilic                                             Not visualized +----------+------------+---------+-----------+----------+--------------+  Summary:  Right: No evidence of thrombosis  in the subclavian.  Left: No evidence of deep vein thrombosis in the upper extremity. No evidence of superficial vein thrombosis involving visualized veins of the left upper extremity.  *See table(s) above for measurements and observations.     Preliminary       [name] 04/20/2021   Patient ID: [name], female   DOB: [birth year], 35 y.o.   MRN: [id number]

## 2021-04-20 NOTE — TOC Progression Note (Addendum)
Transition of Care [facility]) - Progression Note    Patient Details  Name: Mikayla Rios MRN: [id number] Date of Birth: May 23, 1986  [facility] CM/SW Contact  Leeroy Cha, RN Phone Number: 04/20/2021, 8:23 AM  Clinical Narrative:    Expand All Collapse All                                                                                                                   4 Days Post-Op    Subjective/Chief Complaint: Has had more bleeding into ostomy bag Reports pain less     Objective: Vital signs in last 24 hours: Temp:  [98 F (36.7 C)-98.5 F (36.9 C)] 98.1 F (36.7 C) (11/13 0630) Pulse Rate:  [95-123] 105 (11/13 0645) Resp:  [16-25] 22 (11/13 0700) BP: (131-183)/(33-114) 155/52 (11/13 0700) SpO2:  [100 %] 100 % (11/13 0645) Last BM Date: 04/18/21   Intake/Output from previous day: 11/12 0701 - 11/13 0700 In: 3072.5 [I.V.:1864.1; Blood:275; NG/GT:240; IV Piggyback:693.5] Out: 2175 [Urine:100; Emesis/NG output:1100; Drains:325; Stool:650] Intake/Output this shift: No intake/output data recorded.   Exam: Awake and alert Abdomen soft, obese, drain serous, ostomy pink, old blood in bag    TOC PLAN OF CARE: to return home with self care. Lives with spouse for support    Expected Discharge Plan: Home/Self Care Barriers to Discharge: Continued Medical Work up  Expected Discharge Plan and Services Expected Discharge Plan: Home/Self Care   Discharge Planning Services: CM Consult   Living arrangements for the past 2 months: Apartment                                       Social Determinants of Health (SDOH) Interventions    Readmission Risk Interventions No flowsheet data found.

## 2021-04-20 NOTE — Progress Notes (Signed)
PROGRESS NOTE    Mikayla Rios  XQJ:[id number] DOB: 05/16/1986 DOA: 04/10/2021 PCP: [name], MD    Chief Complaint  Patient presents with   Weakness   Tachycardia    Brief Narrative:   35 year old female with past medical history of hypertension, inappropriate sinus tachycardia, obesity, obstructive sleep apnea and diverticulosis with complicated diverticulitis and abscess formation 11/2020 status post IR guided placement of 2 left lower quadrant drains who presents to [facility] emergency department with complaints of generalized weakness and lightheadedness. She was found to have new abscess measuring about 8.5 times 10 cm extending into the left lateral abd wall. She underwent drain exchange/upsize on Monday by IR. She subsequently underwent colectomy/colostomy, small bowel resection with retroperitoneal abscess drainage on 11/9. Post op, she was monitored in the ICU on vent and briefly required pressors. She was extubated on 11/10 and has since been weaned off pressors. She was transferred back to [facility] on 11/12  Assessment & Plan:   Principal Problem:   Diverticulitis of large intestine with abscess Active Problems:   Class 3 severe obesity due to excess calories with serious comorbidity and body mass index (BMI) of 60.0 to 69.9 in adult [facility])   Obstructive sleep apnea   Anemia   Inappropriate sinus tachycardia   Essential hypertension   SIRS (systemic inflammatory response syndrome) (HCC)   Hypokalemia, inadequate intake   Protein-calorie malnutrition, severe (HCC)   Reactive thrombocytosis   Prolonged QT interval   Complicated diverticulitis with abscess s/p drain placement by IR twice in June 2022 with numerous exchanges since then due to persistent fistulous -CT of the abdomen and pelvis New lobulated enhancing fluid collection posterior to this segment of inflamed colon measuring 8.5 x 3.5 x 10.0 cm. This collection now invades the adjacent iliopsoas muscle  as well as extends through the left lateral abdominal wall. -Patient has had prolonged treatment with antibiotics and multiple drain placements, but despite conservative measures, she has failed to improve and now has a new abscess -Seen by general surgery and she underwent colectomy/colostomy and drainage of retroperitoneal abscess -Currently on IV cefepime/ flagyl and anidulafungin per surgery -Currently with NG tube in place -She has wound VAC over abdominal wound -Defer further postop care to surgery  Septic shock -Secondary to retroperitoneal abscess -Patient required vasopressor therapy postoperatively, but has been able to wean off pressors -Blood pressures now elevated -She is on broad-spectrum antibiotics  Acute respiratory failure with hypoxia requiring mechanical ventilation -Patient was intubated for operative management and due to hemodynamic instability postoperatively, decision was made to leave the patient intubated and monitored in the ICU -She was able to extubate on 11/10 and respiratory status has been stable  Possible left colon lesion -Noted on CT scan from 02/04/2021 -She had been referred to GI as an outpatient for colonoscopy -This can be pursued on an elective basis once her acute issues have resolved   Mechanical Fall:  Patient reports dizziness and an episode of syncope and an episode of mechanical fall possibly from orthostatic hypotension.  Monitor on telemetry. CT angiogram of the chest ruled out PE EKG shows prolonged QT interval 636.  Nonspecific T wave inversions.troponin is negative. Patient denies any chest pain or shortness of breath. Probably secondary to orthostatic hypotension from infection. Overall QTC has improved   Hypokalemia Replaced   Prolonged QTc: - from hypokalemia and hypomagnesemia. - K is 4.3. magnesium is 1.8. Keep k >4 and mag>2.   Repeat EKG shows improvement in  the qtC.    Anemia of chronic disease/ Acute anemia of  acute illness - normocytic.  -Baseline creatinine appears to be between 7-8 -Patient noted to have significant bleeding in ostomy -She was transfused a total of 7 units prbc thus far -Hgb currently at 7.5 -Continue to follow CBC Transfuse to keep hemoglobin greater than 7.  Post op bleeding from ostomy -concern it may be bleeding at small bowel anastomosis -defer further intervention if needed to general surgery -She received 2 units of FFP on 11/13 -continue to follow hemoglobin and supportive care with PRBC transfusions   Anasarca -related to hypoalbuminemia and aggressive volume repletion -start on albumin infusion and IV lasix with good urine output -Swelling appears to be improving  Hypoalbuminemia  - will get dietary consult.  -currently on TPN since NPO   Sinus tachycardia  Probably from anemia and infection and pain.  Pt not symptomatic.  Echocardiogram unremarkable -Continue on IV Lopressor  Hypertension  Currently on IV lopressor and prn hydralazine   DVT prophylaxis: SCDs Code Status: Full code Family Communication: Discussed with patient at the bedside Disposition:   Status is: Inpatient  Remains inpatient appropriate because: post op management, IV antibiotics, anemia needing transfusion       Consultants:  Surgery IR Gastroenterology Cardiology PCCM  Procedures: Laparoscopic left colectomy/end colostomy, small bowel resection, left retroperitoneal/flank abscess drainage  ETT 11/09>11/10  Antimicrobials:  Antibiotics Given (last 72 hours)     Date/Time Action Medication Dose Rate   04/18/21 0008 New Bag/Given   ceFEPIme (MAXIPIME) 2 g in sodium chloride 0.9 % 100 mL IVPB 2 g 200 mL/hr   04/18/21 0440 New Bag/Given   metroNIDAZOLE (FLAGYL) IVPB 500 mg 500 mg 100 mL/hr   04/18/21 1019 New Bag/Given   ceFEPIme (MAXIPIME) 2 g in sodium chloride 0.9 % 100 mL IVPB 2 g 200 mL/hr   04/18/21 1742 New Bag/Given   metroNIDAZOLE (FLAGYL) IVPB  500 mg 500 mg 100 mL/hr   04/18/21 1743 New Bag/Given   ceFEPIme (MAXIPIME) 2 g in sodium chloride 0.9 % 100 mL IVPB 2 g 200 mL/hr   04/19/21 0104 New Bag/Given   ceFEPIme (MAXIPIME) 2 g in sodium chloride 0.9 % 100 mL IVPB 2 g 200 mL/hr   04/19/21 0525 New Bag/Given   metroNIDAZOLE (FLAGYL) IVPB 500 mg 500 mg 100 mL/hr   04/19/21 0846 New Bag/Given   ceFEPIme (MAXIPIME) 2 g in sodium chloride 0.9 % 100 mL IVPB 2 g 200 mL/hr   04/19/21 1634 New Bag/Given   ceFEPIme (MAXIPIME) 2 g in sodium chloride 0.9 % 100 mL IVPB 2 g 200 mL/hr   04/19/21 1736 New Bag/Given   metroNIDAZOLE (FLAGYL) IVPB 500 mg 500 mg 100 mL/hr   04/20/21 0051 New Bag/Given   ceFEPIme (MAXIPIME) 2 g in sodium chloride 0.9 % 100 mL IVPB 2 g 200 mL/hr   04/20/21 2423 New Bag/Given   metroNIDAZOLE (FLAGYL) IVPB 500 mg 500 mg 100 mL/hr   04/20/21 0830 New Bag/Given   ceFEPIme (MAXIPIME) 2 g in sodium chloride 0.9 % 100 mL IVPB 2 g 200 mL/hr   04/20/21 1639 New Bag/Given   ceFEPIme (MAXIPIME) 2 g in sodium chloride 0.9 % 100 mL IVPB 2 g 200 mL/hr   04/20/21 1729 New Bag/Given   metroNIDAZOLE (FLAGYL) IVPB 500 mg 500 mg 100 mL/hr         Subjective: Reports that she has significant pain when she is moved.  Feels that PCA is helping with  pain management.  Objective: Vitals:   04/20/21 1342 04/20/21 1600 04/20/21 1632 04/20/21 1700  BP:  (!) 170/94  (!) 164/96  Pulse: (!) 115 (!) 115  (!) 122  Resp: (!) 22 (!) 23 (!) 24 (!) 28  Temp:    98.3 F (36.8 C)  TempSrc:    Oral  SpO2: 100% 100% 100% 99%  Weight:      Height:        Intake/Output Summary (Last 24 hours) at 04/20/2021 1836 Last data filed at 04/20/2021 1600 Gross per 24 hour  Intake 3533.35 ml  Output 6880 ml  Net -3346.65 ml   Filed Weights   04/10/21 1436 04/15/21 0808 04/15/21 1800  Weight: 90.7 kg 123.6 kg 99.5 kg    Examination:  General exam: Alert, awake, oriented x 3 Respiratory system: Clear to auscultation. Respiratory effort  normal. Cardiovascular system:RRR. No murmurs, rubs, gallops. Gastrointestinal system: Abdomen is nondistended, soft and nontender. No organomegaly or masses felt. Normal bowel sounds heard.  Ostomy bag recently changed and is empty Central nervous system: Alert and oriented. No focal neurological deficits. Extremities: Swelling in lower extremities is better Skin: No rashes, lesions or ulcers Psychiatry: Judgement and insight appear normal. Mood & affect appropriate.      Data Reviewed: I have personally reviewed following labs and imaging studies  CBC: Recent Labs  Lab 04/18/21 0230 04/18/21 1108 04/18/21 1800 04/19/21 0214 04/19/21 1500 04/20/21 0053 04/20/21 0500  WBC 13.3*  --  22.2* 17.2*  --  13.3* 12.1*  HGB 6.5*   < > 8.0* 6.9* 8.7* 7.9* 7.5*  HCT 19.5*   < > 23.7* 21.2* 25.9* 23.0* 21.9*  MCV 89.9  --  89.1 90.2  --  88.8 89.4  PLT 250  --  334 312  --  326 313   < > = values in this interval not displayed.    Basic Metabolic Panel: Recent Labs  Lab 04/16/21 0500 04/17/21 0500 04/18/21 0230 04/19/21 0214 04/20/21 0530  NA 132* 137 139 140 138  K 3.8 3.5 3.7 4.5 5.3*  CL 101 105 108 112* 105  CO2 _0 GLUCOSE 290* 114* 123* 105* 432*  BUN 12 16 21* 25* 20  CREATININE 0.59 0.53 0.52 0.41* 0.36*  CALCIUM 7.2* 7.6* 7.6* 7.4* 8.5*  MG 1.7 2.2 2.2 2.0 2.0  PHOS 4.3 3.4 3.6 3.1 4.8*    GFR: Estimated Creatinine Clearance: 108.2 mL/min (A) (by C-G formula based on SCr of 0.36 mg/dL (L)).  Liver Function Tests: Recent Labs  Lab 04/15/21 1815 04/16/21 0500 04/17/21 0500 04/19/21 0214 04/20/21 0530  AST 22 15 13* 10* 10*  ALT _1 ALKPHOS 71 62 51 47 43  BILITOT 1.1 0.6 0.5 0.3 0.5  PROT 4.2* 4.2* 4.2* 4.0* 4.9*  ALBUMIN 1.9* 1.7* 2.0* 1.5* 2.7*    CBG: Recent Labs  Lab 04/19/21 1805 04/19/21 2322 04/20/21 0508 04/20/21 1153 04/20/21 1728  GLUCAP 109* 113* 109* 102* 105*     Recent Results (from the past 240 hour(s))   Aerobic/Anaerobic Culture w Gram Stain (surgical/deep wound)     Status: None   Collection Time: 04/10/21  7:56 PM   Specimen: Abscess  Result Value Ref Range Status   Specimen Description   Final    ABSCESS Performed at [facility] [address] [place], [place] [address]    Special Requests   Final    NONE Performed at  [facility], [place] [address], [place], [place] [address]    Gram Stain   Final    NO SQUAMOUS EPITHELIAL CELLS SEEN FEW WBC SEEN FEW GRAM POSITIVE COCCI    Culture   Final    FEW ESCHERICHIA COLI FEW ACTINOMYCES ODONTOLYTICUS Standardized susceptibility testing for this organism is not available. MODERATE BACTEROIDES OVATUS BETA LACTAMASE POSITIVE Performed at [facility], [place] [address], [place], [place] [address]    Report Status 04/13/2021 FINAL  Final   Organism ID, Bacteria ESCHERICHIA COLI  Final      Susceptibility   Escherichia coli - MIC*    AMPICILLIN >=32 RESISTANT Resistant     CEFAZOLIN 8 SENSITIVE Sensitive     CEFEPIME <=0.12 SENSITIVE Sensitive     CEFTAZIDIME <=1 SENSITIVE Sensitive     CEFTRIAXONE <=0.25 SENSITIVE Sensitive     CIPROFLOXACIN >=4 RESISTANT Resistant     GENTAMICIN <=1 SENSITIVE Sensitive     IMIPENEM <=0.25 SENSITIVE Sensitive     TRIMETH/SULFA <=20 SENSITIVE Sensitive     AMPICILLIN/SULBACTAM >=32 RESISTANT Resistant     PIP/TAZO <=4 SENSITIVE Sensitive     * FEW ESCHERICHIA COLI  Urine Culture     Status: Abnormal   Collection Time: 04/11/21  1:09 PM   Specimen: Urine, Clean Catch  Result Value Ref Range Status   Specimen Description   Final    URINE, CLEAN CATCH Performed at [facility], [place] [address], [place], [place] [address]    Special Requests   Final    NONE Performed at [facility], [place] [address], [place], [place] [address]    Culture 70,000 COLONIES/mL ESCHERICHIA COLI (A)  Final   Report Status 04/13/2021  FINAL  Final   Organism ID, Bacteria ESCHERICHIA COLI (A)  Final      Susceptibility   Escherichia coli - MIC*    AMPICILLIN >=32 RESISTANT Resistant     CEFAZOLIN 8 SENSITIVE Sensitive     CEFEPIME <=0.12 SENSITIVE Sensitive     CEFTRIAXONE <=0.25 SENSITIVE Sensitive     CIPROFLOXACIN >=4 RESISTANT Resistant     GENTAMICIN <=1 SENSITIVE Sensitive     IMIPENEM <=0.25 SENSITIVE Sensitive     NITROFURANTOIN <=16 SENSITIVE Sensitive     TRIMETH/SULFA <=20 SENSITIVE Sensitive     AMPICILLIN/SULBACTAM >=32 RESISTANT Resistant     PIP/TAZO <=4 SENSITIVE Sensitive     * 70,000 COLONIES/mL ESCHERICHIA COLI  Surgical PCR screen     Status: None   Collection Time: 04/14/21  4:34 PM   Specimen: Nasal Mucosa; Nasal Swab  Result Value Ref Range Status   MRSA, PCR NEGATIVE NEGATIVE Final   Staphylococcus aureus NEGATIVE NEGATIVE Final    Comment: (NOTE) The Xpert SA Assay (FDA approved for NASAL specimens in patients 54 years of age and older), is one component of a comprehensive surveillance program. It is not intended to diagnose infection nor to guide or monitor treatment. Performed at [facility], [place] [address] [place], [place] [address]           Radiology Studies: VAS [place] UPPER EXTREMITY VENOUS DUPLEX  Result Date: 04/20/2021 UPPER VENOUS STUDY  Patient Name:  Mikayla Rios  Date of Exam:   04/19/2021 Medical Rec #: [id number]       Accession #:    [id number] Date of Birth: May 19, 1986        Patient Gender: F Patient Age:   41 years Exam Location:  [facility] Procedure:      VAS [place] UPPER EXTREMITY VENOUS DUPLEX Referring Phys: Dossie Ocanas --------------------------------------------------------------------------------  Indications: Edema Limitations: Poor ultrasound/tissue interface and body habitus. Comparison Study: No prior study Performing Technologist: Maudry Mayhew MHA, RDMS, RVT, RDCS  Examination Guidelines: A complete evaluation includes  B-mode imaging, spectral Doppler, color Doppler, and power Doppler as needed of all accessible portions of each vessel. Bilateral testing is considered an integral part of a complete examination. Limited examinations for reoccurring indications may be performed as noted.  Right Findings: +----------+------------+---------+-----------+----------+-------+ RIGHT     CompressiblePhasicitySpontaneousPropertiesSummary +----------+------------+---------+-----------+----------+-------+ Subclavian               Yes       Yes                      +----------+------------+---------+-----------+----------+-------+  Left Findings: +----------+------------+---------+-----------+----------+--------------+ LEFT      CompressiblePhasicitySpontaneousProperties   Summary     +----------+------------+---------+-----------+----------+--------------+ IJV           Full       Yes       Yes                             +----------+------------+---------+-----------+----------+--------------+ Subclavian    Full       Yes       Yes                             +----------+------------+---------+-----------+----------+--------------+ Axillary      Full       Yes       Yes                             +----------+------------+---------+-----------+----------+--------------+ Brachial      Full       Yes       Yes                             +----------+------------+---------+-----------+----------+--------------+ Radial        Full                                                 +----------+------------+---------+-----------+----------+--------------+ Ulnar         Full                                                 +----------+------------+---------+-----------+----------+--------------+ Cephalic      Full                                                 +----------+------------+---------+-----------+----------+--------------+ Basilic                                              Not visualized +----------+------------+---------+-----------+----------+--------------+  Summary:  Right: No evidence of thrombosis in the  subclavian.  Left: No evidence of deep vein thrombosis in the upper extremity. No evidence of superficial vein thrombosis involving visualized veins of the left upper extremity.  *See table(s) above for measurements and observations.  Diagnosing physician: Monica Martinez MD Electronically signed by Monica Martinez MD on 04/20/2021 at 11:59:48 AM.    Final         Scheduled Meds:  chlorhexidine gluconate (MEDLINE KIT)  15 mL Mouth Rinse BID   Chlorhexidine Gluconate Cloth  6 each Topical Daily   furosemide  20 mg Intravenous BID   HYDROmorphone   Intravenous Q4H   insulin aspart  0-9 Units Subcutaneous Q6H   lip balm  1 application Topical BID   metoprolol tartrate  5 mg Intravenous Q4H   pantoprazole (PROTONIX) IV  40 mg Intravenous Daily   Continuous Infusions:  sodium chloride Stopped (04/19/21 1232)   anidulafungin Stopped (04/20/21 1114)   ceFEPime (MAXIPIME) IV Stopped (04/20/21 1710)   methocarbamol (ROBAXIN) IV     metronidazole 500 mg (04/20/21 1729)   TPN ADULT (ION) 90 mL/hr at 04/20/21 1828     LOS: 10 days   Time spent: 4 mins    Kathie Dike, MD [facility]   To contact the attending provider between 7A-7P or the covering provider during after hours 7P-7A, please log into the web site [email or web address] and access using universal [place] password for that web site. If you do not have the password, please call the hospital operator.  04/20/2021, 6:36 PM

## 2021-04-20 NOTE — Progress Notes (Signed)
Inpatient Rehab Admissions Coordinator:   Pt. Is not medically ready for CIR at this time. I will continue to follow for potential admit pending insurance auth and medical readiness.  Clemens Catholic, [place], [place] Admissions Coordinator  [phone] ([place]) [phone] (office)

## 2021-04-20 NOTE — Consult Note (Addendum)
[place] Nurse Consult follow-up Note: Reason for Consult: Pt is followed by the surgical team for post-op plan of care; PA at the bedside to assess wound appearance.  Vac dressing changed to midline full thickness post-op abd wound.  Pt medicated for pain prior to the procedure with PCA and tolerated with mod amt discomfort.  Wound bed: beefy red Drainage (amount, consistency, odor) minimal amt pink drainage Periwound: intact skin surrounding Dressing procedure/placement/frequency: Applied one piece black foam to 141mm cont suction. WOC will plan to change dressing again on Wed.   [place] Nurse ostomy follow up Pt had colostomy surgery performed on 11/9 Stoma type/location: Stoma is red and viable, above skin level, 2 inches.  Peristomal assessment: small amt tarry liquid drainage, no stool or flatus Ostomy pouching: 2pc.  Education provided: Demonstrated pouch change using barrier ring to attempt to maintain a seal and 2 piece pouching system. Pt is feeling poorly while in ICU with NG and did not assist today.   Educational materials and 2 extra sets of pouching supplies left at bedside. Use supplies: barrier ring, [place] # [phone], wafer [place] Cables, pouch [place] # [phone]. Enrolled patient in [place] Start Discharge program: Yes, previously WOC will perform another pouching change and teaching session on Wed.  Thank-you,  Julien Girt MSN, [place], [place], [place], [place]

## 2021-04-21 ENCOUNTER — Other Ambulatory Visit: Payer: Self-pay | Admitting: Oncology

## 2021-04-21 DIAGNOSIS — J9601 Acute respiratory failure with hypoxia: Secondary | ICD-10-CM | POA: Diagnosis not present

## 2021-04-21 DIAGNOSIS — K5720 Diverticulitis of large intestine with perforation and abscess without bleeding: Secondary | ICD-10-CM | POA: Diagnosis not present

## 2021-04-21 DIAGNOSIS — I10 Essential (primary) hypertension: Secondary | ICD-10-CM | POA: Diagnosis not present

## 2021-04-21 DIAGNOSIS — D649 Anemia, unspecified: Secondary | ICD-10-CM | POA: Diagnosis not present

## 2021-04-21 LAB — BPAM RBC
Blood Product Expiration Date: 202211292359
Blood Product Expiration Date: 202211292359
Blood Product Expiration Date: 202212052359
Blood Product Expiration Date: 202212052359
Blood Product Expiration Date: 202212062359
ISSUE DATE / TIME: 202211111040
ISSUE DATE / TIME: 202211120604
ISSUE DATE / TIME: 202211130550
ISSUE DATE / TIME: 202211130928
Unit Type and Rh: 6200
Unit Type and Rh: 6200
Unit Type and Rh: 6200
Unit Type and Rh: 6200
Unit Type and Rh: 6200

## 2021-04-21 LAB — CBC
HCT: 21.8 % — ABNORMAL LOW (ref 36.0–46.0)
Hemoglobin: 7.2 g/dL — ABNORMAL LOW (ref 12.0–15.0)
MCH: 31.6 pg (ref 26.0–34.0)
MCHC: 33 g/dL (ref 30.0–36.0)
MCV: 95.6 fL (ref 80.0–100.0)
Platelets: 349 10*3/uL (ref 150–400)
RBC: 2.28 MIL/uL — ABNORMAL LOW (ref 3.87–5.11)
RDW: 17.6 % — ABNORMAL HIGH (ref 11.5–15.5)
WBC: 11.3 10*3/uL — ABNORMAL HIGH (ref 4.0–10.5)
nRBC: 0 % (ref 0.0–0.2)

## 2021-04-21 LAB — BASIC METABOLIC PANEL
Anion gap: 6 (ref 5–15)
Anion gap: 6 (ref 5–15)
BUN: 22 mg/dL — ABNORMAL HIGH (ref 6–20)
BUN: 21 mg/dL — ABNORMAL HIGH (ref 6–20)
CO2: 26 mmol/L (ref 22–32)
CO2: 26 mmol/L (ref 22–32)
Calcium: 8.5 mg/dL — ABNORMAL LOW (ref 8.9–10.3)
Calcium: 7.5 mg/dL — ABNORMAL LOW (ref 8.9–10.3)
Chloride: 99 mmol/L (ref 98–111)
Chloride: 107 mmol/L (ref 98–111)
Creatinine, Ser: 0.53 mg/dL (ref 0.44–1.00)
Creatinine, Ser: 0.35 mg/dL — ABNORMAL LOW (ref 0.44–1.00)
GFR, Estimated: >60 mL/min (ref >60)
GFR, Estimated: >60 mL/min (ref >60)
Glucose, Bld: 1172 mg/dL (ref 70–99)
Glucose, Bld: 94 mg/dL (ref 70–99)
Potassium: 2.9 mmol/L — ABNORMAL LOW (ref 3.5–5.1)
Potassium: 2.8 mmol/L — ABNORMAL LOW (ref 3.5–5.1)
Sodium: 131 mmol/L — ABNORMAL LOW (ref 135–145)
Sodium: 139 mmol/L (ref 135–145)

## 2021-04-21 LAB — TYPE AND SCREEN
ABO/RH(D): A POS
Antibody Screen: NEGATIVE
Unit division: 0
Unit division: 0
Unit division: 0
Unit division: 0
Unit division: 0

## 2021-04-21 LAB — TRIGLYCERIDES: Triglycerides: 71 mg/dL (ref <150)

## 2021-04-21 LAB — GLUCOSE, CAPILLARY
Glucose-Capillary: 117 mg/dL — ABNORMAL HIGH (ref 70–99)
Glucose-Capillary: 112 mg/dL — ABNORMAL HIGH (ref 70–99)
Glucose-Capillary: 116 mg/dL — ABNORMAL HIGH (ref 70–99)
Glucose-Capillary: 124 mg/dL — ABNORMAL HIGH (ref 70–99)

## 2021-04-21 LAB — PHOSPHORUS
Phosphorus: 2.5 mg/dL (ref 2.5–4.6)
Phosphorus: 4.1 mg/dL (ref 2.5–4.6)

## 2021-04-21 LAB — VITAMIN B12: Vitamin B-12: 374 pg/mL (ref 180–914)

## 2021-04-21 LAB — MAGNESIUM
Magnesium: 2.2 mg/dL (ref 1.7–2.4)
Magnesium: 1.3 mg/dL — ABNORMAL LOW (ref 1.7–2.4)

## 2021-04-21 MED ORDER — ACETAMINOPHEN 500 MG PO TABS
1000.0000 mg | ORAL_TABLET | Freq: Four times a day (QID) | ORAL | Status: DC
  Administered 2021-04-21 – 2021-05-01 (×35): 1000 mg via ORAL
  Filled 2021-04-21 (×37): qty 2

## 2021-04-21 MED ORDER — TRAVASOL 10 % IV SOLN
INTRAVENOUS | Status: AC
  Filled 2021-04-21: qty 1188

## 2021-04-21 MED ORDER — OXYCODONE HCL 5 MG PO TABS
5.0000 mg | ORAL_TABLET | ORAL | Status: DC | PRN
  Administered 2021-04-22 – 2021-04-26 (×15): 10 mg via ORAL
  Filled 2021-04-21 (×15): qty 2

## 2021-04-21 MED ORDER — MAGNESIUM SULFATE 4 GM/100ML IV SOLN
4.0000 g | Freq: Once | INTRAVENOUS | Status: AC
  Administered 2021-04-21: 4 g via INTRAVENOUS
  Filled 2021-04-21: qty 100

## 2021-04-21 MED ORDER — METHOCARBAMOL 500 MG PO TABS: 750.0000 mg | ORAL_TABLET | Freq: Four times a day (QID) | ORAL | Status: DC | PRN

## 2021-04-21 MED ORDER — POTASSIUM PHOSPHATES 15 MMOLE/5ML IV SOLN
30.0000 mmol | Freq: Once | INTRAVENOUS | Status: DC
  Filled 2021-04-21: qty 10

## 2021-04-21 MED ORDER — DICLOFENAC SODIUM 1 % EX GEL
4.0000 g | Freq: Four times a day (QID) | CUTANEOUS | Status: DC
  Administered 2021-04-21 – 2021-04-29 (×18): 4 g via TOPICAL
  Filled 2021-04-21: qty 100

## 2021-04-21 MED ORDER — POTASSIUM CHLORIDE 10 MEQ/100ML IV SOLN
10.0000 meq | INTRAVENOUS | Status: AC
  Administered 2021-04-21 (×6): 10 meq via INTRAVENOUS
  Filled 2021-04-21 (×6): qty 100

## 2021-04-21 NOTE — Progress Notes (Signed)
Physical Therapy Treatment Patient Details Name: Mikayla Rios MRN: [id number] DOB: [birth year] Today's Date: 04/21/2021   History of Present Illness Mikayla Rios is a 35 yo female presents with complaints of generalized weakness and lightheadedness. PMH: HTN, sinus tachny, obesity, obstructive sleep apnea and diverticulosis with complicated diverticulitis and abscess formation 11/2020 status post IR guided placement of 2 left lower quadrant drains.  S/PP LAPAROSCOPIC ASSISTED [place]N  (LEFT COLECTOMY / END COLOSTOMY), SMALL BOWEL RESECTION, LEFT RETROPERITONEAL / FLANK ABSCESS DRAINAGE 04/15/21    PT Comments    The patient is eager to mobilize, required +2 mod assistance to sit up on bed edge. +2 mod assist to stand and pivot step to recliner. Patient reports transfer was easier today.Continue PT for progressive mobility.    Recommendations for follow up therapy are one component of a multi-disciplinary discharge planning process, led by the attending physician.  Recommendations may be updated based on patient status, additional functional criteria and insurance authorization.  Follow Up Recommendations  Acute inpatient rehab (3hours/day)     Assistance Recommended at Discharge Frequent or constant Supervision/Assistance  Equipment Recommendations  Rolling walker (2 wheels)    Recommendations for Other Services       Precautions / Restrictions Precautions Precaution Comments: monitor HR, colostomy, R drain, VAC, NG     Mobility  Bed Mobility Overal bed mobility: Needs Assistance Bed Mobility: Supine to Sit     Supine to sit: HOB elevated;+2 for safety/equipment;+2 for physical assistance;Mod assist     General bed mobility comments: slow to move, assist with legs and trunk to semi roll and push self up.    Transfers Overall transfer level: Needs assistance Equipment used: Rolling walker (2 wheels) Transfers: Sit to/from Stand;Bed to chair/wheelchair/BSC Sit to Stand: Mod  assist;+2 physical assistance;+2 safety/equipment Stand pivot transfers: Mod assist;+2 safety/equipment;+2 physical assistance         General transfer comment: assist to stand and take a few steps to recliner using RW    Ambulation/Gait                   Stairs             Wheelchair Mobility    Modified Rankin (Stroke Patients Only)       Balance Overall balance assessment: Needs assistance Sitting-balance support: Feet supported;Bilateral upper extremity supported Sitting balance-Leahy Scale: Fair Sitting balance - Comments: seated EOB   Standing balance support: Bilateral upper extremity supported;Reliant on assistive device for balance;During functional activity Standing balance-Leahy Scale: Poor                              Cognition Arousal/Alertness: Awake/alert Behavior During Therapy: WFL for tasks assessed/performed Overall Cognitive Status: Within Functional Limits for tasks assessed                                 General Comments: pt pleasant, ready to mobilize        Exercises      General Comments        Pertinent Vitals/Pain Pain Score: 5  Pain Location: abdomen Pain Descriptors / Indicators: Cramping Pain Intervention(s): PCA encouraged;Monitored during session    Home Living                          Prior Function  PT Goals (current goals can now be found in the care plan section) Progress towards PT goals: Progressing toward goals    Frequency    Min 3X/week      PT Plan Current plan remains appropriate    Co-evaluation              AM-PAC PT "6 Clicks" Mobility   Outcome Measure  Help needed turning from your back to your side while in a flat bed without using bedrails?: A Lot Help needed moving from lying on your back to sitting on the side of a flat bed without using bedrails?: A Lot Help needed moving to and from a bed to a chair (including a  wheelchair)?: A Lot Help needed standing up from a chair using your arms (e.g., wheelchair or bedside chair)?: Total Help needed to walk in hospital room?: Total Help needed climbing 3-5 steps with a railing? : Total 6 Click Score: 9    End of Session   Activity Tolerance: Patient tolerated treatment well Patient left: in chair;with call bell/phone within reach;with family/visitor present Nurse Communication: Mobility status PT Visit Diagnosis: Unsteadiness on feet (R26.81);Other abnormalities of gait and mobility (R26.89);Muscle weakness (generalized) (M62.81);Pain     Time: 1400-1430 PT Time Calculation (min) (ACUTE ONLY): 30 min  Charges:  $Therapeutic Activity: 23-37 mins                     Tresa Endo PT Acute Rehabilitation Services Pager [phone] Office [phone]    Claretha Cooper 04/21/2021, 3:49 PM

## 2021-04-21 NOTE — Progress Notes (Signed)
Inpatient Rehab Admissions Coordinator:   Pt. Is not medically ready for CIR at this time. Will continue to follow for potential admission pending medical readiness, insurance auth, and bed availability.  Clemens Catholic, [place], [place] Admissions Coordinator  [phone] ([place]) [phone] (office)

## 2021-04-21 NOTE — Progress Notes (Signed)
Patient s/p urgent Hartmann colectomy/colostomy & SB resection for colon perforation\ surgery on 04/15/2021 Pt still in [facility] hospital recovering   Dx:  T4N0 colon cancer   [place]:   1.  Please set the patient up for GI Tumor Board.  Patient Care Team: [name], MD as PCP - General (Internal Medicine) [name], MD as PCP - Cardiology (Cardiology) [name], [name] as Physician Assistant (Emergency Medicine) Leighton Ruff, MD as Consulting Physician (General Surgery)   2.   Please set up outpatient consult(s):  Med oncology Benay Spice or Burr Medico) & rad oncology (Moody/Perkins)

## 2021-04-21 NOTE — Progress Notes (Signed)
PROGRESS NOTE    Mikayla Rios  XVQ:[id number] DOB: Nov 13, 1985 DOA: 04/10/2021 PCP: [name], MD    Chief Complaint  Patient presents with   Weakness   Tachycardia    Brief Narrative:   35 year old female with past medical history of hypertension, inappropriate sinus tachycardia, obesity, obstructive sleep apnea and diverticulosis with complicated diverticulitis and abscess formation 11/2020 status post IR guided placement of 2 left lower quadrant drains who presents to [facility] emergency department with complaints of generalized weakness and lightheadedness. She was found to have new abscess measuring about 8.5 times 10 cm extending into the left lateral abd wall. She underwent drain exchange/upsize on Monday by IR. She subsequently underwent colectomy/colostomy, small bowel resection with retroperitoneal abscess drainage on 11/9. Post op, she was monitored in the ICU on vent and briefly required pressors. She was extubated on 11/10 and has since been weaned off pressors. She was transferred back to [facility] on 11/12  Assessment & Plan:   Principal Problem:   Diverticulitis of large intestine with abscess Active Problems:   Class 3 severe obesity due to excess calories with serious comorbidity and body mass index (BMI) of 60.0 to 69.9 in adult [facility])   Obstructive sleep apnea   Anemia   Inappropriate sinus tachycardia   Essential hypertension   SIRS (systemic inflammatory response syndrome) (HCC)   Hypokalemia, inadequate intake   Protein-calorie malnutrition, severe (HCC)   Reactive thrombocytosis   Prolonged QT interval   Complicated diverticulitis with abscess s/p drain placement by IR twice in June 2022 with numerous exchanges since then due to persistent fistulous -CT of the abdomen and pelvis New lobulated enhancing fluid collection posterior to this segment of inflamed colon measuring 8.5 x 3.5 x 10.0 cm. This collection now invades the adjacent iliopsoas muscle  as well as extends through the left lateral abdominal wall. -Patient has had prolonged treatment with antibiotics and multiple drain placements, but despite conservative measures, she has failed to improve and now has a new abscess -Seen by general surgery and she underwent colectomy/colostomy and drainage of retroperitoneal abscess -She is completed a course of IV antibiotics -Currently with NG tube in place, plans to clamp tube today -She has wound VAC over abdominal wound -Defer further postop care to surgery  Septic shock -Secondary to retroperitoneal abscess -Patient required vasopressor therapy postoperatively, but has been able to wean off pressors -Blood pressures now elevated -She has completed antibiotics  Acute respiratory failure with hypoxia requiring mechanical ventilation -Patient was intubated for operative management and due to hemodynamic instability postoperatively, decision was made to leave the patient intubated and monitored in the ICU -She was able to extubate on 11/10 and respiratory status has been stable  Adenocarcinoma colon -New diagnosis -Biopsies from surgery 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   Mechanical Fall:  Patient reports dizziness and an episode of syncope and an episode of mechanical fall possibly from orthostatic hypotension.  Monitor on telemetry. CT angiogram of the chest ruled out PE EKG shows prolonged QT interval 636.  Nonspecific T wave inversions.troponin is negative. Patient denies any chest pain or shortness of breath. Probably secondary to orthostatic hypotension from infection. Overall QTC has improved   Hypokalemia Replace adenocarcinoma of colon   Prolonged QTc: - from hypokalemia and hypomagnesemia. - K is 4.3. magnesium is 1.8. Keep k >4 and mag>2.   Repeat  EKG shows  improvement in the qtC.    Anemia of chronic disease/ Acute anemia of acute illness - normocytic.  -Baseline creatinine appears to be between 7-8 -Patient noted to have significant bleeding in ostomy -She was transfused a total of 7 units prbc thus far -Hgb currently at 7.2 -Continue to follow CBC Transfuse to keep hemoglobin greater than 7.  Post op bleeding from ostomy -concern it may be bleeding at small bowel anastomosis -defer further intervention if needed to general surgery -She received 2 units of FFP on 11/13 -Overall bleeding seems to be improving -continue to follow hemoglobin and supportive care with PRBC transfusions   Anasarca -related to hypoalbuminemia and aggressive volume repletion -Received albumin infusion and IV lasix with good urine output -Swelling appears to be improving  Hypoalbuminemia  - will get dietary consult.  -currently on TPN since NPO   Sinus tachycardia  Probably from anemia and infection and pain.  Pt not symptomatic.  Echocardiogram unremarkable -Continue on IV Lopressor -Transition to oral Lopressor once able to take p.o.  Hypertension  Currently on IV lopressor and prn hydralazine -Restart on oral Lopressor once able   DVT prophylaxis: SCDs Code Status: Full code Family Communication: Discussed with patient at the bedside Disposition:   Status is: Inpatient  Remains inpatient appropriate because: post op management, IV antibiotics, anemia needing transfusion       Consultants:  Surgery IR Gastroenterology Cardiology PCCM  Procedures: Laparoscopic left colectomy/end colostomy, small bowel resection, left retroperitoneal/flank abscess drainage  ETT 11/09>11/10  Antimicrobials:  Antibiotics Given (last 72 hours)     Date/Time Action Medication Dose Rate   04/19/21 0104 New Bag/Given   ceFEPIme (MAXIPIME) 2 g in sodium chloride 0.9 % 100 mL IVPB 2 g 200 mL/hr   04/19/21 0525 New Bag/Given   metroNIDAZOLE  (FLAGYL) IVPB 500 mg 500 mg 100 mL/hr   04/19/21 0846 New Bag/Given   ceFEPIme (MAXIPIME) 2 g in sodium chloride 0.9 % 100 mL IVPB 2 g 200 mL/hr   04/19/21 1634 New Bag/Given   ceFEPIme (MAXIPIME) 2 g in sodium chloride 0.9 % 100 mL IVPB 2 g 200 mL/hr   04/19/21 1736 New Bag/Given   metroNIDAZOLE (FLAGYL) IVPB 500 mg 500 mg 100 mL/hr   04/20/21 0051 New Bag/Given   ceFEPIme (MAXIPIME) 2 g in sodium chloride 0.9 % 100 mL IVPB 2 g 200 mL/hr   04/20/21 0514 New Bag/Given   metroNIDAZOLE (FLAGYL) IVPB 500 mg 500 mg 100 mL/hr   04/20/21 0830 New Bag/Given   ceFEPIme (MAXIPIME) 2 g in sodium chloride 0.9 % 100 mL IVPB 2 g 200 mL/hr   04/20/21 1639 New Bag/Given   ceFEPIme (MAXIPIME) 2 g in sodium chloride 0.9 % 100 mL IVPB 2 g 200 mL/hr   04/20/21 1729 New Bag/Given   metroNIDAZOLE (FLAGYL) IVPB 500 mg 500 mg 100 mL/hr   04/21/21 0137 New Bag/Given   ceFEPIme (MAXIPIME) 2 g in sodium chloride 0.9 % 100 mL IVPB 2 g 200 mL/hr   04/21/21 0502 New Bag/Given   metroNIDAZOLE (FLAGYL) IVPB 500 mg 500 mg 100 mL/hr         Subjective: Complains of pain in in her toes bilaterally which is sharp like pins-and-needles.  Feels that this is been brought on after swelling has come down with diuresis.  Output from NG tube has been minimal.  Plan to clamp NG tube today.  Objective: Vitals:   04/21/21 1606 04/21/21 1734 04/21/21 1900 04/21/21 2047  BP:  Marland Kitchen)  155/86    Pulse:  (!) 105    Resp: (!) 24 (!) 29  (!) 22  Temp:  99 F (37.2 C) 98.1 F (36.7 C)   TempSrc:  Oral Oral   SpO2: 100% 100%  100%  Weight:      Height:        Intake/Output Summary (Last 24 hours) at 04/21/2021 2103 Last data filed at 04/21/2021 1845 Gross per 24 hour  Intake 3247.53 ml  Output 2670 ml  Net 577.53 ml   Filed Weights   04/10/21 1436 04/15/21 0808 04/15/21 1800  Weight: 90.7 kg 123.6 kg 99.5 kg    Examination:  General exam: Alert, awake, oriented x 3 Respiratory system: Clear to auscultation.  Respiratory effort normal. Cardiovascular system:RRR. No murmurs, rubs, gallops. Gastrointestinal system: Abdomen is nondistended, soft and nontender. No organomegaly or masses felt. Normal bowel sounds heard.  Ostomy bag currently empty, wound VAC in place Central nervous system: Alert and oriented. No focal neurological deficits. Extremities: Overall anasarca continues to improve Skin: No rashes, lesions or ulcers Psychiatry: Judgement and insight appear normal. Mood & affect appropriate.       Data Reviewed: I have personally reviewed following labs and imaging studies  CBC: Recent Labs  Lab 04/19/21 0214 04/19/21 1500 04/20/21 0053 04/20/21 0500 04/20/21 1847 04/21/21 0500  WBC 17.2*  --  13.3* 12.1* 13.4* 11.3*  HGB 6.9* 8.7* 7.9* 7.5* 7.7* 7.2*  HCT 21.2* 25.9* 23.0* 21.9* 23.7* 21.8*  MCV 90.2  --  88.8 89.4 90.8 95.6  PLT 312  --  326 313 361 062    Basic Metabolic Panel: Recent Labs  Lab 04/18/21 0230 04/19/21 0214 04/20/21 0530 04/21/21 0500 04/21/21 0600  NA 139 140 138 131* 139  K 3.7 4.5 5.3* 2.9* 2.8*  CL 108 112* 105 99 107  CO2 27 25 26 26 26   GLUCOSE 123* 105* 432* 1,172* 94  BUN 21* 25* 20 22* 21*  CREATININE 0.52 0.41* 0.36* 0.53 0.35*  CALCIUM 7.6* 7.4* 8.5* 8.5* 7.5*  MG 2.2 2.0 2.0 2.2 1.3*  PHOS 3.6 3.1 4.8* 2.5 4.1    GFR: Estimated Creatinine Clearance: 108.2 mL/min (A) (by C-G formula based on SCr of 0.35 mg/dL (L)).  Liver Function Tests: Recent Labs  Lab 04/15/21 1815 04/16/21 0500 04/17/21 0500 04/19/21 0214 04/20/21 0530  AST 22 15 13* 10* 10*  ALT 13 11 9 9 9   ALKPHOS 71 62 51 47 43  BILITOT 1.1 0.6 0.5 0.3 0.5  PROT 4.2* 4.2* 4.2* 4.0* 4.9*  ALBUMIN 1.9* 1.7* 2.0* 1.5* 2.7*    CBG: Recent Labs  Lab 04/20/21 1728 04/20/21 2322 04/21/21 0537 04/21/21 1146 04/21/21 1736  GLUCAP 105* 104* 117* 112* 116*     Recent Results (from the past 240 hour(s))  Surgical PCR screen     Status: None   Collection Time:  04/14/21  4:34 PM   Specimen: Nasal Mucosa; Nasal Swab  Result Value Ref Range Status   MRSA, PCR NEGATIVE NEGATIVE Final   Staphylococcus aureus NEGATIVE NEGATIVE Final    Comment: (NOTE) The Xpert SA Assay (FDA approved for NASAL specimens in patients 3 years of age and older), is one component of a comprehensive surveillance program. It is not intended to diagnose infection nor to guide or monitor treatment. Performed at [facility], [place] [address], [place], [place] [address]           Radiology Studies: No results found.  Scheduled Meds:  acetaminophen  1,000 mg Oral Q6H   chlorhexidine gluconate (MEDLINE KIT)  15 mL Mouth Rinse BID   Chlorhexidine Gluconate Cloth  6 each Topical Daily   diclofenac Sodium  4 g Topical QID   furosemide  20 mg Intravenous BID   HYDROmorphone   Intravenous Q4H   insulin aspart  0-9 Units Subcutaneous Q6H   lip balm  1 application Topical BID   metoprolol tartrate  5 mg Intravenous Q4H   pantoprazole (PROTONIX) IV  40 mg Intravenous Daily   Continuous Infusions:  sodium chloride Stopped (04/19/21 1232)   TPN ADULT (ION) 90 mL/hr at 04/21/21 1809     LOS: 11 days   Time spent: 35 mins    Kathie Dike, MD [facility]   To contact the attending provider between 7A-7P or the covering provider during after hours 7P-7A, please log into the web site [email or web address] and access using universal [place] password for that web site. If you do not have the password, please call the hospital operator.  04/21/2021, 9:03 PM

## 2021-04-21 NOTE — Progress Notes (Signed)
Brief Oncology Note:  We were made aware of the new diagnosis of colon cancer. I have placed an ambulatory referral to Oncology to arrange a new patient appt following discharge. I also sent a message to the GI Navigator about this patient.  Mikey Bussing, DNP, AGPCNP-BC, AOCNP

## 2021-04-21 NOTE — Progress Notes (Signed)
PHARMACY - TOTAL PARENTERAL NUTRITION CONSULT NOTE   Indication:  intolerance to enteral feeding  Patient Measurements: Height: 5\' 3"  (160 cm) Weight: 99.5 kg (219 lb 5.7 oz) IBW/kg (Calculated) : 52.4 TPN AdjBW (KG): 62 Body mass index is 38.86 kg/m. Usual Weight: 90.7  Assessment: 35 yo F s/p laparoscopic assisted Hartmann (left colectomy / end colostomy), small bowel resection, left retroperitoneal / flank abscess drainage on 04/15/21.   Start TPN per pharmacy for nutrition support.  PMH:  hypertension, inappropriate sinus tachycardia, obesity, obstructive sleep apnea and diverticulosis with complicated diverticulitis and abscess formation 11/2020 status post IR guided placement of 2 left lower quadrant drains  Glucose / Insulin: no hx DM; A1c 4.6; no PTA medications. - CBGs 102-117 except 1172 with chemistry (suspect erroneous due to improper collection) (goal 150-180) - Dexamethasone 10mg  IV on 11/9 - SSI 0 unit / 24 hrs Electrolytes: Initial labs adulterated with TPN, suspect the same for labs from 11/14. Repeat labs: K down to 2.8 (after removing from TPN on 11/14), Mg low at 1.3, CorrCa 8.5, others WNL - hx prolonged QTC, goal K =>4 and Mg =>2 - 11/14 QTc 430 per tele Renal:  WNL Hepatic: LFTs WNL, TG 246 (11/14) Intake / Output; MIVF: UOP 900  ml/24 hrs  - no drain output reported; NG output 750 mL; Stool 950 mL - no mIVF.  - Lasix 20 BID GI Imaging: GI Surgeries / Procedures:  11/9 Lap Hartmann (left colectomy / end colostomy), small bowel resection, left retroperitoneal / flank abscess drainage  Central access: PICC replaced 11/11 TPN start date: 04/16/21  Nutritional Goals:  Goal TPN rate 90 mL/hr provides 119 g of protein and 2225 kcals per day  RD Assessment: (11/11) Estimated Needs Total Energy Estimated Needs: 2150-2400 kcal Total Protein Estimated Needs: 110-125 grams Total Fluid Estimated Needs: >/= 2.2 L/day  Current Nutrition:  NPO TPN held on 11/11 d/t  malpositioned PICC  Plan:  Now:  Mg sulfate 4 g x 1 KCl 10 meq x 6 runs  At 1800: Continue TPN at 90 mL/hr at 1800 Electrolytes in TPN: Na 69mEq/L, K 50 mEq/L, Ca 7.72mEq/L, Mg 91mEq/L, and Phos 5 mmol/L. Cl:Ac 1:2 Add standard MVI and trace elements to TPN Continue Sensitive q6h SSI and adjust as needed  IVF per MD (none) Monitor TPN labs on Mon/Thurs. BMET, Mg, and Phos in AM  Ulice Dash D  04/21/2021 7:10 AM

## 2021-04-21 NOTE — Progress Notes (Signed)
Mikayla Rios [id number] [birth year]  CARE TEAM:  PCP: [name], MD  Outpatient Care Team: Patient Care Team: [name], MD as PCP - General (Internal Medicine) [name], MD as PCP - Cardiology (Cardiology) [name], [name] as Physician Assistant (Emergency Medicine) Leighton Ruff, MD as Consulting Physician (General Surgery)  Inpatient Treatment Team: Treatment Team: Attending Provider: Kathie Dike, MD; Consulting Physician: Edison Pace, Md, MD; CIR Admissions Coordinator: Danise Edge; Rounding Team: Garner Gavel, MD; [place] Nurse: Tenna Child, RN; Registered Nurse: Retta Diones, RN; Registered Nurse: Shon Hough, RN; Pharmacist: Napoleon Form, [facility]; Case Manager: Frann Rider, RN; Physical Therapist: Fuller Mandril, PT   Problem List:   Principal Problem:   Diverticulitis of large intestine with abscess Active Problems:   Class 3 severe obesity due to excess calories with serious comorbidity and body mass index (BMI) of 60.0 to 69.9 in adult [facility])   Obstructive sleep apnea   Anemia   Inappropriate sinus tachycardia   Essential hypertension   SIRS (systemic inflammatory response syndrome) (HCC)   Hypokalemia, inadequate intake   Protein-calorie malnutrition, severe (HCC)   Reactive thrombocytosis   Prolonged QT interval   6 Days Post-Op  04/15/2021  POST-OPERATIVE DIAGNOSIS:   CHRONIC PERFORATED COLON WITH RETROPERITONEAL ABSCESS PARTIAL SMALL BOWEL OBSTRUCTION PARTIAL COLON OBSTRUCTION   PROCEDURE:  LAPAROSCOPIC ASSISTED HARTMANN  (LEFT COLECTOMY / END COLOSTOMY)  MOBILIZATION OF SPLENIC FLEXURE OF COLON SMALL BOWEL RESECTION LEFT RETROPERITONEAL / FLANK ABSCESS DRAINAGE   SURGEON:  Adin Hector, MD    Assessment/Plan Complicated chronic diverticulitis 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 Cannot exclude perforated colon mass.  - Originally admitted from 5/31-6/6 with  perforated diverticulitis with an abscess.  Underwent CT guided drain placement x2 on 6/3 and was discharged on cefdinir and flagyl. She followed up with Dr. Marcello Moores in our office and has had multiple drain exchanges/upsizes since this time without resolution. Patient was referred for colonoscopy but never got one prior to this admission.    S/P LAPAROSCOPIC ASSISTED HARTMANN  (LEFT COLECTOMY / END COLOSTOMY), SMALL BOWEL RESECTION, LEFT RETROPERITONEAL / FLANK ABSCESS DRAINAGE 04/15/21 Dr. [name]  - , afebrile, sinus tachycardia , WBC 16 from 28  - follow surgical path -Try NG tube clamping trial since having bowel function and NG tube put has gone down c- VAC change M/W/F - WOC RN following for ostomy education  - PT/OT after transfusion   FEN - NPO, IVF, NG to LIWS, TPN, prealbumin is 7.3 VTE - SCDs, Lovenox ID - Merrem, Eraxis ordered by [place].  Apparently switched to cefepime and Flagyl.  Complete 5-day course post op today   ABL anemia -hematochezia perhaps at small bowel anastomosis.  Follow closely.  Transfuse as needed Hb less than 7 or more symptomatic.  Seems okay for now  Acute respiratory failure with hypoxia - extubated 11/11  Septic shock - resolved, no signs of end organ damage. Off pressors. Good UOP. Pyuria - UCx w/ E. Coli Syncope - TRH working up; echo 11/9 LVEF 60-65%          25 minutes spent in review, evaluation, examination, counseling, and coordination of care.   I have reviewed this patient's available data, including medical history, events of note, physical examination and test results as part of my evaluation.  A significant portion of that time was spent in counseling.  Care during the described time interval was provided by me.  04/21/2021  Subjective: (Chief complaint)  Patient feeling better this morning.  No more GI bleeding.  NG tube output has tapered off in the last 12 hours.  Able to get up into chair with nursing assist.  Nursing  in room.  Pain better controlled  Objective:  Vital signs:  Vitals:   04/21/21 0000 04/21/21 0131 04/21/21 0457 04/21/21 0542  BP: (!) 160/82     Pulse: (!) 116     Resp: (!) 25 (!) 22 (!) 25   Temp:    98.4 F (36.9 C)  TempSrc:    Oral  SpO2: 100% 98% 100%   Weight:      Height:        Last BM Date: 04/20/21 (ostomy)  Intake/Output   Yesterday:  11/14 0701 - 11/15 0700 In: 817 [I.V.:492.3; IV Piggyback:324.7] Out: 2630 [Urine:900; Emesis/NG output:750; Stool:980] This shift:  No intake/output data recorded.  Bowel function:  Flatus: YES  BM:  YES  Drain: Nasogastric tube with thin green bilious output 19 French surgical Blake drain thinly serosanguineous.  Mostly serous   Physical Exam:  General: Pt awake/alert in no acute distress Eyes: PERRL, normal EOM.  Sclera clear.  No icterus Neuro: CN II-XII intact w/o focal sensory/motor deficits. Lymph: No head/neck/groin lymphadenopathy Psych:  No delerium/psychosis/paranoia.  Oriented x 4 HENT: Normocephalic, Mucus membranes moist.  No thrush Neck: Supple, No tracheal deviation.  No obvious thyromegaly Chest: No pain to chest wall compression.  Good respiratory excursion.  No audible wheezing CV:  Pulses intact.  Regular rhythm.  No major extremity edema MS: Normal AROM mjr joints.  No obvious deformity  Abdomen: Soft.  Mildy distended.  Mildly tender at incisions only.  No evidence of peritonitis.  No incarcerated hernias. Ostomy pink with less edema.  Dark thin effluent.  No hematochezia.  Some flatus.  Midline incision sponge intact with thinly serosanguineous output  GU: Foley out.  Urine wick in place Ext:   No deformity.  No mjr edema.  No cyanosis Skin: No petechiae / purpurea.  No major sores.  Warm and dry    Results:   Cultures: Recent Results (from the past 720 hour(s))  Resp Panel by RT-PCR (Flu A&B, Covid) Nasopharyngeal Swab     Status: None   Collection Time: 04/10/21  2:46 PM   Specimen:  Nasopharyngeal Swab; Nasopharyngeal(NP) swabs in vial transport medium  Result Value Ref Range Status   SARS Coronavirus 2 by RT PCR NEGATIVE NEGATIVE Final    Comment: (NOTE) SARS-CoV-2 target nucleic acids are NOT DETECTED.  The SARS-CoV-2 RNA is generally detectable in upper respiratory specimens during the acute phase of infection. The lowest concentration of SARS-CoV-2 viral copies this assay can detect is 138 copies/mL. A negative result does not preclude SARS-Cov-2 infection and should not be used as the sole basis for treatment or other patient management decisions. A negative result may occur with  improper specimen collection/handling, submission of specimen other than nasopharyngeal swab, presence of viral mutation(s) within the areas targeted by this assay, and inadequate number of viral copies(<138 copies/mL). A negative result must be combined with clinical observations, patient history, and epidemiological information. The expected result is Negative.  Fact Sheet for Patients:  EntrepreneurPulse.com.au  Fact Sheet for Healthcare Providers:  IncredibleEmployment.be  This test is no t yet approved or cleared by the [place] FDA and  has been authorized for detection and/or diagnosis of SARS-CoV-2 by FDA under an Emergency Use Authorization (EUA). This EUA will remain  in effect (meaning this test can be used) for the duration of the COVID-19 declaration under Section 564(b)(1) of the Act, 21 U.S.C.section 360bbb-3(b)(1), unless the authorization is terminated  or revoked sooner.       Influenza A by PCR NEGATIVE NEGATIVE Final   Influenza B by PCR NEGATIVE NEGATIVE Final    Comment: (NOTE) The Xpert Xpress SARS-CoV-2/FLU/RSV plus assay is intended as an aid in the diagnosis of influenza from Nasopharyngeal swab specimens and should not be used as a sole basis for treatment. Nasal washings and aspirates are unacceptable for  Xpert Xpress SARS-CoV-2/FLU/RSV testing.  Fact Sheet for Patients: EntrepreneurPulse.com.au  Fact Sheet for Healthcare Providers: IncredibleEmployment.be  This test is not yet approved or cleared by the [place] FDA and has been authorized for detection and/or diagnosis of SARS-CoV-2 by FDA under an Emergency Use Authorization (EUA). This EUA will remain in effect (meaning this test can be used) for the duration of the COVID-19 declaration under Section 564(b)(1) of the Act, 21 U.S.C. section 360bbb-3(b)(1), unless the authorization is terminated or revoked.  Performed at [facility], [place] [address] [place], [place] [address]   Blood culture (routine x 2)     Status: None   Collection Time: 04/10/21  4:42 PM   Specimen: BLOOD  Result Value Ref Range Status   Specimen Description   Final    BLOOD RIGHT ANTECUBITAL Performed at [place] [address], [place], [place] [address]    Special Requests   Final    BOTTLES DRAWN AEROBIC AND ANAEROBIC Blood Culture adequate volume Performed at [place] [address] [place], [place] [address]    Culture   Final    NO GROWTH 5 DAYS Performed at [facility], [place] [address] [place], [place] [address]    Report Status 04/15/2021 FINAL  Final  Aerobic/Anaerobic Culture w Gram Stain (surgical/deep wound)     Status: None   Collection Time: 04/10/21  7:56 PM   Specimen: Abscess  Result Value Ref Range Status   Specimen Description   Final    ABSCESS Performed at [place] [address] [place], [place] [address]    Special Requests   Final    NONE Performed at [facility], [place] [address] [place], [place] [address]    Gram Stain   Final    NO SQUAMOUS EPITHELIAL CELLS SEEN FEW WBC SEEN FEW GRAM POSITIVE COCCI    Culture   Final    FEW ESCHERICHIA COLI FEW  ACTINOMYCES ODONTOLYTICUS Standardized susceptibility testing for this organism is not available. MODERATE BACTEROIDES OVATUS BETA LACTAMASE POSITIVE Performed at [facility], [place] [address], [place], [place] [address]    Report Status 04/13/2021 FINAL  Final   Organism ID, Bacteria ESCHERICHIA COLI  Final      Susceptibility   Escherichia coli - MIC*    AMPICILLIN >=32 RESISTANT Resistant     CEFAZOLIN 8 SENSITIVE Sensitive     CEFEPIME <=0.12 SENSITIVE Sensitive     CEFTAZIDIME <=1 SENSITIVE Sensitive     CEFTRIAXONE <=0.25 SENSITIVE Sensitive     CIPROFLOXACIN >=4 RESISTANT Resistant     GENTAMICIN <=1 SENSITIVE Sensitive     IMIPENEM <=0.25 SENSITIVE Sensitive     TRIMETH/SULFA <=20 SENSITIVE Sensitive     AMPICILLIN/SULBACTAM >=32 RESISTANT Resistant     PIP/TAZO <=4 SENSITIVE Sensitive     * FEW ESCHERICHIA COLI  Urine  Culture     Status: Abnormal   Collection Time: 04/11/21  1:09 PM   Specimen: Urine, Clean Catch  Result Value Ref Range Status   Specimen Description   Final    URINE, CLEAN CATCH Performed at [facility], [place] [address], [place], [place] [address]    Special Requests   Final    NONE Performed at [facility], [place] [address] [place], [place] [address]    Culture 70,000 COLONIES/mL ESCHERICHIA COLI (A)  Final   Report Status 04/13/2021 FINAL  Final   Organism ID, Bacteria ESCHERICHIA COLI (A)  Final      Susceptibility   Escherichia coli - MIC*    AMPICILLIN >=32 RESISTANT Resistant     CEFAZOLIN 8 SENSITIVE Sensitive     CEFEPIME <=0.12 SENSITIVE Sensitive     CEFTRIAXONE <=0.25 SENSITIVE Sensitive     CIPROFLOXACIN >=4 RESISTANT Resistant     GENTAMICIN <=1 SENSITIVE Sensitive     IMIPENEM <=0.25 SENSITIVE Sensitive     NITROFURANTOIN <=16 SENSITIVE Sensitive     TRIMETH/SULFA <=20 SENSITIVE Sensitive     AMPICILLIN/SULBACTAM >=32 RESISTANT Resistant     PIP/TAZO <=4 SENSITIVE Sensitive     *  70,000 COLONIES/mL ESCHERICHIA COLI  Surgical PCR screen     Status: None   Collection Time: 04/14/21  4:34 PM   Specimen: Nasal Mucosa; Nasal Swab  Result Value Ref Range Status   MRSA, PCR NEGATIVE NEGATIVE Final   Staphylococcus aureus NEGATIVE NEGATIVE Final    Comment: (NOTE) The Xpert SA Assay (FDA approved for NASAL specimens in patients 2 years of age and older), is one component of a comprehensive surveillance program. It is not intended to diagnose infection nor to guide or monitor treatment. Performed at [facility] [place] [address], [place], [place] [address]     Labs: Results for orders placed or performed during the hospital encounter of 04/10/21 (from the past 48 hour(s))  Prepare RBC (crossmatch)     Status: None   Collection Time: 04/19/21  9:50 AM  Result Value Ref Range   Order Confirmation      ORDER PROCESSED BY BLOOD BANK Performed at [facility] [address], [place], [place] [address]   Prepare fresh frozen plasma     Status: None   Collection Time: 04/19/21  9:50 AM  Result Value Ref Range   Unit Number [phone]    Blood Component Type THAWED PLASMA    Unit division 00    Status of Unit ISSUED,FINAL    Transfusion Status      OK TO TRANSFUSE Performed at [place] [address], [place], [place] [address]    Unit Number [id number]    Blood Component Type THW PLS APHR    Unit division A0    Status of Unit ISSUED,FINAL    Transfusion Status OK TO TRANSFUSE   Glucose, capillary     Status: Abnormal   Collection Time: 04/19/21 11:58 AM  Result Value Ref Range   Glucose-Capillary 107 (H) 70 - 99 mg/dL    Comment: Glucose reference range applies only to samples taken after fasting for at least 8 hours.  Protime-INR     Status: None   Collection Time: 04/19/21  3:00 PM  Result Value Ref Range   Prothrombin Time 15.0 11.4 - 15.2 seconds   INR 1.2 0.8 - 1.2    Comment:  (NOTE) INR goal varies based on  device and disease states. Performed at [facility], [place] [address], [place], [place] [address]   APTT     Status: None   Collection Time: 04/19/21  3:00 PM  Result Value Ref Range   aPTT 35 24 - 36 seconds    Comment: Performed at [facility], [place] [address], [place], [place] [address]  Hemoglobin and hematocrit, blood     Status: Abnormal   Collection Time: 04/19/21  3:00 PM  Result Value Ref Range   Hemoglobin 8.7 (L) 12.0 - 15.0 g/dL    Comment: REPEATED TO VERIFY DELTA CHECK NOTED    HCT 25.9 (L) 36.0 - 46.0 %    Comment: Performed at [facility], [place] [address] [place], [place] [address]  Glucose, capillary     Status: None   Collection Time: 04/19/21  4:06 PM  Result Value Ref Range   Glucose-Capillary 95 70 - 99 mg/dL    Comment: Glucose reference range applies only to samples taken after fasting for at least 8 hours.   Comment 1 Notify RN    Comment 2 Document in Chart   Glucose, capillary     Status: Abnormal   Collection Time: 04/19/21  6:05 PM  Result Value Ref Range   Glucose-Capillary 109 (H) 70 - 99 mg/dL    Comment: Glucose reference range applies only to samples taken after fasting for at least 8 hours.   Comment 1 Notify RN    Comment 2 Document in Chart   Glucose, capillary     Status: Abnormal   Collection Time: 04/19/21 11:22 PM  Result Value Ref Range   Glucose-Capillary 113 (H) 70 - 99 mg/dL    Comment: Glucose reference range applies only to samples taken after fasting for at least 8 hours.  Lactic acid, plasma     Status: None   Collection Time: 04/20/21 12:52 AM  Result Value Ref Range   Lactic Acid, Venous 1.1 0.5 - 1.9 mmol/L    Comment: Performed at [facility], [place] [address], [place], [place] [address]  CBC     Status: Abnormal   Collection Time: 04/20/21 12:53 AM  Result Value Ref Range   WBC 13.3 (H) 4.0 - 10.5 K/uL   RBC 2.59  (L) 3.87 - 5.11 MIL/uL   Hemoglobin 7.9 (L) 12.0 - 15.0 g/dL   HCT 23.0 (L) 36.0 - 46.0 %   MCV 88.8 80.0 - 100.0 fL   MCH 30.5 26.0 - 34.0 pg   MCHC 34.3 30.0 - 36.0 g/dL   RDW 17.9 (H) 11.5 - 15.5 %   Platelets 326 150 - 400 K/uL   nRBC 0.0 0.0 - 0.2 %    Comment: Performed at [facility], [place] [address], [place], [place] [address]  Lactic acid, plasma     Status: None   Collection Time: 04/20/21  3:35 AM  Result Value Ref Range   Lactic Acid, Venous 1.0 0.5 - 1.9 mmol/L    Comment: Performed at [facility], [place] [address], [place], [place] [address]  CBC     Status: Abnormal   Collection Time: 04/20/21  5:00 AM  Result Value Ref Range   WBC 12.1 (H) 4.0 - 10.5 K/uL   RBC 2.45 (L) 3.87 - 5.11 MIL/uL   Hemoglobin 7.5 (L) 12.0 - 15.0 g/dL   HCT 21.9 (L) 36.0 - 46.0 %   MCV 89.4 80.0 - 100.0 fL  MCH 30.6 26.0 - 34.0 pg   MCHC 34.2 30.0 - 36.0 g/dL   RDW 18.0 (H) 11.5 - 15.5 %   Platelets 313 150 - 400 K/uL   nRBC 0.0 0.0 - 0.2 %    Comment: Performed at [facility], [place] [address] [place], [place] [address]  Glucose, capillary     Status: Abnormal   Collection Time: 04/20/21  5:08 AM  Result Value Ref Range   Glucose-Capillary 109 (H) 70 - 99 mg/dL    Comment: Glucose reference range applies only to samples taken after fasting for at least 8 hours.  Triglycerides     Status: Abnormal   Collection Time: 04/20/21  5:30 AM  Result Value Ref Range   Triglycerides 246 (H) <150 mg/dL    Comment: Performed at [facility], [place] [address] [place], [place] [address]  Comprehensive metabolic panel     Status: Abnormal   Collection Time: 04/20/21  5:30 AM  Result Value Ref Range   Sodium 138 135 - 145 mmol/L   Potassium 5.3 (H) 3.5 - 5.1 mmol/L   Chloride 105 98 - 111 mmol/L   CO2 26 22 - 32 mmol/L   Glucose, Bld 432 (H) 70 - 99 mg/dL    Comment: Glucose reference range applies only to samples taken  after fasting for at least 8 hours.   BUN 20 6 - 20 mg/dL   Creatinine, Ser 0.36 (L) 0.44 - 1.00 mg/dL   Calcium 8.5 (L) 8.9 - 10.3 mg/dL   Total Protein 4.9 (L) 6.5 - 8.1 g/dL   Albumin 2.7 (L) 3.5 - 5.0 g/dL   AST 10 (L) 15 - 41 U/L   ALT 9 0 - 44 U/L   Alkaline Phosphatase 43 38 - 126 U/L   Total Bilirubin 0.5 0.3 - 1.2 mg/dL   GFR, Estimated >60 >60 mL/min    Comment: (NOTE) Calculated using the CKD-EPI Creatinine Equation (2021)    Anion gap 7 5 - 15    Comment: Performed at [facility], [place] [address], [place], [place] [address]  Magnesium     Status: None   Collection Time: 04/20/21  5:30 AM  Result Value Ref Range   Magnesium 2.0 1.7 - 2.4 mg/dL    Comment: Performed at [facility], [place] [address], [place], [place] [address]  Phosphorus     Status: Abnormal   Collection Time: 04/20/21  5:30 AM  Result Value Ref Range   Phosphorus 4.8 (H) 2.5 - 4.6 mg/dL    Comment: Performed at [facility], [place] [address], [place], [place] [address]  Glucose, capillary     Status: Abnormal   Collection Time: 04/20/21 11:53 AM  Result Value Ref Range   Glucose-Capillary 102 (H) 70 - 99 mg/dL    Comment: Glucose reference range applies only to samples taken after fasting for at least 8 hours.  Glucose, capillary     Status: Abnormal   Collection Time: 04/20/21  5:28 PM  Result Value Ref Range   Glucose-Capillary 105 (H) 70 - 99 mg/dL    Comment: Glucose reference range applies only to samples taken after fasting for at least 8 hours.   Comment 1 Notify RN    Comment 2 Document in Chart   CBC     Status: Abnormal   Collection Time: 04/20/21  6:47 PM  Result Value Ref Range   WBC 13.4 (H) 4.0 - 10.5 K/uL  RBC 2.61 (L) 3.87 - 5.11 MIL/uL   Hemoglobin 7.7 (L) 12.0 - 15.0 g/dL   HCT 23.7 (L) 36.0 - 46.0 %   MCV 90.8 80.0 - 100.0 fL   MCH 29.5 26.0 - 34.0 pg   MCHC 32.5 30.0 - 36.0 g/dL   RDW 17.4 (H) 11.5 - 15.5 %    Platelets 361 150 - 400 K/uL   nRBC 0.0 0.0 - 0.2 %    Comment: Performed at [facility], [place] [address], [place], [place] [address]  Glucose, capillary     Status: Abnormal   Collection Time: 04/20/21 11:22 PM  Result Value Ref Range   Glucose-Capillary 104 (H) 70 - 99 mg/dL    Comment: Glucose reference range applies only to samples taken after fasting for at least 8 hours.  Basic metabolic panel     Status: Abnormal   Collection Time: 04/21/21  5:00 AM  Result Value Ref Range   Sodium 131 (L) 135 - 145 mmol/L    Comment: DELTA CHECK NOTED   Potassium 2.9 (L) 3.5 - 5.1 mmol/L    Comment: DELTA CHECK NOTED   Chloride 99 98 - 111 mmol/L   CO2 26 22 - 32 mmol/L   Glucose, Bld 1,172 (HH) 70 - 99 mg/dL    Comment: Glucose reference range applies only to samples taken after fasting for at least 8 hours. DELTA CHECK NOTED CRITICAL RESULT CALLED TO, READ BACK BY AND VERIFIED WITH:  AMANDA WATSON RN 04/21/21 @ 0545 VS  NOTIFIED RN OF POSSIBLE TPN CONTAMINATION RN MAY REQUEST REDRAW IF APPROPRIATE    BUN 22 (H) 6 - 20 mg/dL   Creatinine, Ser 0.53 0.44 - 1.00 mg/dL   Calcium 8.5 (L) 8.9 - 10.3 mg/dL   GFR, Estimated >60 >60 mL/min    Comment: (NOTE) Calculated using the CKD-EPI Creatinine Equation (2021)    Anion gap 6 5 - 15    Comment: Performed at [facility], [place] [address] [place], [place] [address]  Magnesium     Status: None   Collection Time: 04/21/21  5:00 AM  Result Value Ref Range   Magnesium 2.2 1.7 - 2.4 mg/dL    Comment: Performed at [facility], [place] [address] [place], [place] [address]  Phosphorus     Status: None   Collection Time: 04/21/21  5:00 AM  Result Value Ref Range   Phosphorus 2.5 2.5 - 4.6 mg/dL    Comment: Performed at [facility], [place] [address], [place],  [address]  CBC     Status: Abnormal   Collection Time: 04/21/21  5:00 AM  Result Value Ref Range   WBC 11.3  (H) 4.0 - 10.5 K/uL   RBC 2.28 (L) 3.87 - 5.11 MIL/uL   Hemoglobin 7.2 (L) 12.0 - 15.0 g/dL   HCT 21.8 (L) 36.0 - 46.0 %   MCV 95.6 80.0 - 100.0 fL   MCH 31.6 26.0 - 34.0 pg   MCHC 33.0 30.0 - 36.0 g/dL   RDW 17.6 (H) 11.5 - 15.5 %   Platelets 349 150 - 400 K/uL   nRBC 0.0 0.0 - 0.2 %    Comment: Performed at [facility], [place] [address] [place], [place] [address]  Glucose, capillary     Status: Abnormal   Collection Time: 04/21/21  5:37 AM  Result Value Ref Range   Glucose-Capillary 117 (H) 70 - 99 mg/dL    Comment: Glucose  reference range applies only to samples taken after fasting for at least 8 hours.    Imaging / Studies: VAS [place] UPPER EXTREMITY VENOUS DUPLEX  Result Date: 04/20/2021 UPPER VENOUS STUDY  Patient Name:  RENNAE FERRAIOLO Seim  Date of Exam:   04/19/2021 Medical Rec #: [id number]       Accession #:    [id number] Date of Birth: [birth year]        Patient Gender: F Patient Age:   29 years Exam Location:  [facility] Procedure:      VAS [place] UPPER EXTREMITY VENOUS DUPLEX Referring Phys: Jolaine Artist MEMON --------------------------------------------------------------------------------  Indications: Edema Limitations: Poor ultrasound/tissue interface and body habitus. Comparison Study: No prior study Performing Technologist: Maudry Mayhew MHA, RDMS, RVT, RDCS  Examination Guidelines: A complete evaluation includes B-mode imaging, spectral Doppler, color Doppler, and power Doppler as needed of all accessible portions of each vessel. Bilateral testing is considered an integral part of a complete examination. Limited examinations for reoccurring indications may be performed as noted.  Right Findings: +----------+------------+---------+-----------+----------+-------+ RIGHT     CompressiblePhasicitySpontaneousPropertiesSummary +----------+------------+---------+-----------+----------+-------+ Subclavian               Yes       Yes                       +----------+------------+---------+-----------+----------+-------+  Left Findings: +----------+------------+---------+-----------+----------+--------------+ LEFT      CompressiblePhasicitySpontaneousProperties   Summary     +----------+------------+---------+-----------+----------+--------------+ IJV           Full       Yes       Yes                             +----------+------------+---------+-----------+----------+--------------+ Subclavian    Full       Yes       Yes                             +----------+------------+---------+-----------+----------+--------------+ Axillary      Full       Yes       Yes                             +----------+------------+---------+-----------+----------+--------------+ Brachial      Full       Yes       Yes                             +----------+------------+---------+-----------+----------+--------------+ Radial        Full                                                 +----------+------------+---------+-----------+----------+--------------+ Ulnar         Full                                                 +----------+------------+---------+-----------+----------+--------------+ Cephalic      Full                                                 +----------+------------+---------+-----------+----------+--------------+  Basilic                                             Not visualized +----------+------------+---------+-----------+----------+--------------+  Summary:  Right: No evidence of thrombosis in the subclavian.  Left: No evidence of deep vein thrombosis in the upper extremity. No evidence of superficial vein thrombosis involving visualized veins of the left upper extremity.  *See table(s) above for measurements and observations.  Diagnosing physician: Monica Martinez MD Electronically signed by Monica Martinez MD on 04/20/2021 at 11:59:48 AM.    Final     Medications / Allergies: per  chart  Antibiotics: Anti-infectives (From admission, onward)    Start     Dose/Rate Route Frequency Ordered Stop   04/17/21 1700  ceFEPIme (MAXIPIME) 2 g in sodium chloride 0.9 % 100 mL IVPB        2 g 200 mL/hr over 30 Minutes Intravenous Every 8 hours 04/17/21 1530     04/17/21 1700  metroNIDAZOLE (FLAGYL) IVPB 500 mg        500 mg 100 mL/hr over 60 Minutes Intravenous Every 12 hours 04/17/21 1530     04/15/21 1000  anidulafungin (ERAXIS) 100 mg in sodium chloride 0.9 % 100 mL IVPB        100 mg 78 mL/hr over 100 Minutes Intravenous Every 24 hours 04/14/21 0744     04/15/21 0900  clindamycin (CLEOCIN) IVPB 900 mg       See Hyperspace for full Linked Orders Report.   900 mg 100 mL/hr over 30 Minutes Intravenous 60 min pre-op 04/14/21 1405 04/15/21 1142   04/15/21 0900  gentamicin (GARAMYCIN) 340 mg in dextrose 5 % 100 mL IVPB       See Hyperspace for full Linked Orders Report.   5 mg/kg  67.7 kg (Adjusted) 108.5 mL/hr over 60 Minutes Intravenous 60 min pre-op 04/14/21 1405 04/15/21 1834   04/14/21 1000  anidulafungin (ERAXIS) 200 mg in sodium chloride 0.9 % 200 mL IVPB        200 mg 78 mL/hr over 200 Minutes Intravenous  Once 04/14/21 0755 04/14/21 1500   04/13/21 0900  meropenem (MERREM) 1 g in sodium chloride 0.9 % 100 mL IVPB  Status:  Discontinued        1 g 200 mL/hr over 30 Minutes Intravenous Every 8 hours 04/13/21 0800 04/17/21 1530   04/10/21 2200  cefTRIAXone (ROCEPHIN) 2 g in sodium chloride 0.9 % 100 mL IVPB  Status:  Discontinued        2 g 200 mL/hr over 30 Minutes Intravenous Daily at bedtime 04/10/21 2044 04/13/21 0746   04/10/21 2200  metroNIDAZOLE (FLAGYL) IVPB 500 mg  Status:  Discontinued        500 mg 100 mL/hr over 60 Minutes Intravenous Every 12 hours 04/10/21 2044 04/13/21 0745         Note: Portions of this report may have been transcribed using voice recognition software. Every effort was made to ensure accuracy; however, inadvertent computerized  transcription errors may be present.   Any transcriptional errors that result from this process are unintentional.    Adin Hector, MD, FACS, MASCRS Esophageal, Gastrointestinal & Colorectal Surgery Robotic and Minimally Invasive Surgery  [facility], [place]  [place]. [address] [place], [place] [address] [phone] Fax [phone] Main  CONTACT  INFORMATION:  Weekday (9AM-5PM): Call [facility] main office at [phone]  Weeknight (5PM-9AM) or Weekend/Holiday: Check [email or web address] (password " [email or web address]") for General Surgery [facility] coverage  (Please, do not use SecureChat as it is not reliable communication to operating surgeons for immediate patient care)      04/21/2021  7:37 AM

## 2021-04-22 DIAGNOSIS — K5720 Diverticulitis of large intestine with perforation and abscess without bleeding: Secondary | ICD-10-CM | POA: Diagnosis not present

## 2021-04-22 DIAGNOSIS — I10 Essential (primary) hypertension: Secondary | ICD-10-CM | POA: Diagnosis not present

## 2021-04-22 DIAGNOSIS — D649 Anemia, unspecified: Secondary | ICD-10-CM | POA: Diagnosis not present

## 2021-04-22 DIAGNOSIS — Z6841 Body Mass Index (BMI) 40.0 and over, adult: Secondary | ICD-10-CM

## 2021-04-22 LAB — CBC
HCT: 22.9 % — ABNORMAL LOW (ref 36.0–46.0)
Hemoglobin: 7.3 g/dL — ABNORMAL LOW (ref 12.0–15.0)
MCH: 28.9 pg (ref 26.0–34.0)
MCHC: 31.9 g/dL (ref 30.0–36.0)
MCV: 90.5 fL (ref 80.0–100.0)
Platelets: 421 10*3/uL — ABNORMAL HIGH (ref 150–400)
RBC: 2.53 MIL/uL — ABNORMAL LOW (ref 3.87–5.11)
RDW: 16 % — ABNORMAL HIGH (ref 11.5–15.5)
WBC: 11.6 10*3/uL — ABNORMAL HIGH (ref 4.0–10.5)
nRBC: 0 % (ref 0.0–0.2)

## 2021-04-22 LAB — GLUCOSE, CAPILLARY
Glucose-Capillary: 102 mg/dL — ABNORMAL HIGH (ref 70–99)
Glucose-Capillary: 105 mg/dL — ABNORMAL HIGH (ref 70–99)
Glucose-Capillary: 108 mg/dL — ABNORMAL HIGH (ref 70–99)
Glucose-Capillary: 114 mg/dL — ABNORMAL HIGH (ref 70–99)

## 2021-04-22 LAB — COMPREHENSIVE METABOLIC PANEL
ALT: 9 U/L (ref 0–44)
AST: 12 U/L — ABNORMAL LOW (ref 15–41)
Albumin: 2.4 g/dL — ABNORMAL LOW (ref 3.5–5.0)
Alkaline Phosphatase: 63 U/L (ref 38–126)
Anion gap: 5 (ref 5–15)
BUN: 25 mg/dL — ABNORMAL HIGH (ref 6–20)
CO2: 29 mmol/L (ref 22–32)
Calcium: 8.4 mg/dL — ABNORMAL LOW (ref 8.9–10.3)
Chloride: 102 mmol/L (ref 98–111)
Creatinine, Ser: 0.31 mg/dL — ABNORMAL LOW (ref 0.44–1.00)
GFR, Estimated: >60 mL/min (ref >60)
Glucose, Bld: 107 mg/dL — ABNORMAL HIGH (ref 70–99)
Potassium: 3.3 mmol/L — ABNORMAL LOW (ref 3.5–5.1)
Sodium: 136 mmol/L (ref 135–145)
Total Bilirubin: 0.4 mg/dL (ref 0.3–1.2)
Total Protein: 5.2 g/dL — ABNORMAL LOW (ref 6.5–8.1)

## 2021-04-22 LAB — PHOSPHORUS: Phosphorus: 1.4 mg/dL — ABNORMAL LOW (ref 2.5–4.6)

## 2021-04-22 LAB — MAGNESIUM: Magnesium: 1.9 mg/dL (ref 1.7–2.4)

## 2021-04-22 MED ORDER — METOPROLOL TARTRATE 12.5 MG HALF TABLET
12.5000 mg | ORAL_TABLET | Freq: Two times a day (BID) | ORAL | Status: DC
  Administered 2021-04-22: 12.5 mg via ORAL
  Filled 2021-04-22: qty 1

## 2021-04-22 MED ORDER — MAGNESIUM SULFATE 2 GM/50ML IV SOLN
2.0000 g | Freq: Once | INTRAVENOUS | Status: AC
  Administered 2021-04-22: 2 g via INTRAVENOUS
  Filled 2021-04-22: qty 50

## 2021-04-22 MED ORDER — HYDROMORPHONE HCL 1 MG/ML IJ SOLN
0.5000 mg | INTRAMUSCULAR | Status: DC | PRN
Start: 2021-04-22 — End: 2021-04-27
  Administered 2021-04-24 – 2021-04-27 (×16): 1 mg via INTRAVENOUS
  Filled 2021-04-22 (×8): qty 1
  Filled 2021-04-22 (×2): qty 2
  Filled 2021-04-22 (×7): qty 1

## 2021-04-22 MED ORDER — POTASSIUM PHOSPHATES 15 MMOLE/5ML IV SOLN
45.0000 mmol | Freq: Once | INTRAVENOUS | Status: AC
  Administered 2021-04-22: 45 mmol via INTRAVENOUS
  Filled 2021-04-22: qty 15

## 2021-04-22 MED ORDER — METHOCARBAMOL 1000 MG/10ML IJ SOLN
1000.0000 mg | Freq: Four times a day (QID) | INTRAVENOUS | Status: DC | PRN
  Filled 2021-04-22: qty 10

## 2021-04-22 MED ORDER — METHOCARBAMOL 500 MG PO TABS: 1000.0000 mg | ORAL_TABLET | Freq: Four times a day (QID) | ORAL | Status: DC | PRN

## 2021-04-22 MED ORDER — TRAVASOL 10 % IV SOLN
INTRAVENOUS | Status: AC
  Filled 2021-04-22: qty 1188

## 2021-04-22 MED ORDER — METOPROLOL TARTRATE 25 MG PO TABS
25.0000 mg | ORAL_TABLET | Freq: Two times a day (BID) | ORAL | Status: DC
  Administered 2021-04-22 – 2021-05-01 (×18): 25 mg via ORAL
  Filled 2021-04-22 (×18): qty 1

## 2021-04-22 MED ORDER — LORAZEPAM 0.5 MG PO TABS: 0.5000 mg | ORAL_TABLET | Freq: Three times a day (TID) | ORAL | Status: DC | PRN

## 2021-04-22 NOTE — Consult Note (Signed)
[facility] [place] Nurse wound follow up Wound type:Surgical Measurement:9cm x 3.5cm x 2cm Wound bed:red, moist Drainage (amount, consistency, odor) small serous Periwound: intact Dressing procedure/placement/frequency: NPWT dressing change performed by [facility] with WOC Nurse assisting.  One piece of black foam removed, wound cleansed and one piece of black foam replaced.  Dressing attached to 111mmHg continuous negative pressure. Patient tolerated procedure well. Ostomy pouch to be changed on Friday.  [facility] nursing team will follow, but will remain available to this patient, the nursing and medical teams.  Please re-consult if needed. Thanks, Maudie Flakes, MSN, RN, [place], Arther Abbott  Pager# [phone]

## 2021-04-22 NOTE — Progress Notes (Signed)
Mikayla Rios [id number] 08/09/1985  CARE TEAM:  PCP: [name], MD  Outpatient Care Team: Patient Care Team: [name], MD as PCP - General (Internal Medicine) [name], MD as PCP - Cardiology (Cardiology) [name], [name] as Physician Assistant (Emergency Medicine) Leighton Ruff, MD as Consulting Physician (General Surgery)  Inpatient Treatment Team: Treatment Team: Attending Provider: Kerney Elbe, DO; Consulting Physician: Edison Pace, Md, MD; CIR Admissions Coordinator: Genella Mech, [place]; Rounding Team: Garner Gavel, MD; [place] Nurse: Tenna Child, RN; Registered Nurse: Retta Diones, RN; Physical Therapist: Fuller Mandril, PT; Technician: Towanda Malkin, NT; Technician: Ward, Imagene Riches, NT; Technician: Kipp Laurence, NT; Occupational Therapist: Lenward Chancellor, OT/L; Registered Nurse: Camie Patience, RN; Technician: Regis Bill, NT; Pharmacist: Napoleon Form, [facility]   Problem List:   Principal Problem:   Diverticulitis of large intestine with abscess Active Problems:   Class 3 severe obesity due to excess calories with serious comorbidity and body mass index (BMI) of 60.0 to 69.9 in adult [facility])   Obstructive sleep apnea   Anemia   Inappropriate sinus tachycardia   Essential hypertension   SIRS (systemic inflammatory response syndrome) (HCC)   Hypokalemia, inadequate intake   Protein-calorie malnutrition, severe ([place])   Reactive thrombocytosis   Prolonged QT interval   7 Days Post-Op  04/15/2021  POST-OPERATIVE DIAGNOSIS:   CHRONIC PERFORATED COLON WITH RETROPERITONEAL ABSCESS PARTIAL SMALL BOWEL OBSTRUCTION PARTIAL COLON OBSTRUCTION   PROCEDURE:  LAPAROSCOPIC ASSISTED HARTMANN  (LEFT COLECTOMY / END COLOSTOMY)  MOBILIZATION OF SPLENIC FLEXURE OF COLON SMALL BOWEL RESECTION LEFT RETROPERITONEAL / FLANK ABSCESS DRAINAGE   SURGEON:  Adin Hector, MD    Assessment/Plan Complicated chronic  descending colon perforation with retroperitoneal abscess,  refractory to non-operative management. Cannot exclude perforated colon mass.  - Originally admitted from 5/31-6/6 with perforated diverticulitis with an abscess.  Underwent CT guided drain placement x2 on 6/3 and was discharged on cefdinir and flagyl. She followed up with Dr. Marcello Moores in our office and has had multiple drain exchanges/upsizes since this time without resolution. Patient was referred for colonoscopy but never got one prior to this admission.  Pathology consistent with cancer.  T4.  No lymph nodes involved.  Small bowel involvement.  Reached out to medical oncology.  Patient would benefit from post adjuvant chemotherapy and perhaps focal XRT radiation.  Added on to GI tumor for for next week to discuss plan.  Regional consider placement of Port-A-Cath before discharge while she is here and if they feel she will need some oxaliplatin-based or other chemotherapy.  Very unlikely that I will attempt colostomy takedown/reanastomosis for at least another year or so until there is evidence of no local recurrence or metastatic disease.  Updated patient.  She is tearful but very appreciative of care.  Found out that her mother apparently is getting treated for metastatic colorectal cancer as well.  Most likely will need genetics.  Challenging situation  S/P LAPAROSCOPIC ASSISTED HARTMANN  (LEFT COLECTOMY / END COLOSTOMY), SMALL BOWEL RESECTION, LEFT RETROPERITONEAL / FLANK ABSCESS DRAINAGE 04/15/21 Dr. [name]  - , afebrile, WBC falling -Tachycardia fraction of her anxiety.  Consider switching to oral metoprolol for help.  Defer to primary service - adv diet - VAC change at extraction incision periumbilically qM/W/F - WOC RN following for ostomy education  - PT/OT  -Keep surgical drain in abscess for 10/20 days since chronic abscess cavity and flank. -CT scan to rule out abscess or  other issues if worsens.  Resolving clinically  argues against that so far -Okay to transfer to floor from surgery standpoint   FEN - TPN , prealbumin low.  Try soft diet  VTE - SCDs, Lovenox ID - Cefepime and Flagyl.  Complete 5-day course 11/15   ABL anemia -hematochezia resolved.  Possibly at site of small bowel anastomosis  Follow closely.  Transfuse as needed Hb less than 7 or more symptomatic.  Seems okay for now  Acute respiratory failure with hypoxia - extubated 11/11  Septic shock - resolved, no signs of end organ damage. Off pressors. Good UOP. Pyuria - UCx w/ E. Coli Syncope - TRH working up; echo 11/9 LVEF 60-65%          25 minutes spent in review, evaluation, examination, counseling, and coordination of care.   I have reviewed this patient's available data, including medical history, events of note, physical examination and test results as part of my evaluation.  A significant portion of that time was spent in counseling.  Care during the described time interval was provided by me.  04/22/2021    Subjective: (Chief complaint)  Patient feeling better this morning.  No more GI bleeding.  NG tube output has tapered off in the last 12 hours.  Able to get up into chair with nursing assist.  Nursing in room.  Pain better controlled  Objective:  Vital signs:  Vitals:   04/22/21 0400 04/22/21 0423 04/22/21 0500 04/22/21 0726  BP: (!) 178/89  (!) 181/91   Pulse:      Resp: (!) 26 (!) 26 (!) 24 (!) 24  Temp:      TempSrc:      SpO2: 100% 100% 99% 100%  Weight:      Height:        Last BM Date: 04/21/21  Intake/Output   Yesterday:  11/15 0701 - 11/16 0700 In: 4250.8 [I.V.:3113.6; NG/GT:120; IV Piggyback:1017.2] Out: 3060 [Urine:2800; Emesis/NG output:25; Drains:70; Stool:165] This shift:  No intake/output data recorded.  Bowel function:  Flatus: YES  BM:  YES  Drain: Nasogastric tube with thin green bilious output 19 French surgical Blake drain thinly serosanguineous.  Mostly  serous   Physical Exam:  General: Pt awake/alert in no acute distress Eyes: PERRL, normal EOM.  Sclera clear.  No icterus Neuro: CN II-XII intact w/o focal sensory/motor deficits. Lymph: No head/neck/groin lymphadenopathy Psych:  No delerium/psychosis/paranoia.  Oriented x 4 HENT: Normocephalic, Mucus membranes moist.  No thrush Neck: Supple, No tracheal deviation.  No obvious thyromegaly Chest: No pain to chest wall compression.  Good respiratory excursion.  No audible wheezing CV:  Pulses intact.  Regular rhythm.  No major extremity edema MS: Normal AROM mjr joints.  No obvious deformity  Abdomen: Soft.  Mildy distended.  Mildly tender at incisions only.  No evidence of peritonitis.  No incarcerated hernias. Ostomy pink with less edema.  Dark thin effluent.  No hematochezia.  Some flatus.  Midline incision sponge intact with thinly serosanguineous output  GU: Foley out.  Urine wick in place Ext:   No deformity.  No mjr edema.  No cyanosis Skin: No petechiae / purpurea.  No major sores.  Warm and dry    Results:   Cultures: Recent Results (from the past 720 hour(s))  Resp Panel by RT-PCR (Flu A&B, Covid) Nasopharyngeal Swab     Status: None   Collection Time: 04/10/21  2:46 PM   Specimen: Nasopharyngeal Swab; Nasopharyngeal(NP) swabs in vial transport medium  Result Value Ref Range Status   SARS Coronavirus 2 by RT PCR NEGATIVE NEGATIVE Final    Comment: (NOTE) SARS-CoV-2 target nucleic acids are NOT DETECTED.  The SARS-CoV-2 RNA is generally detectable in upper respiratory specimens during the acute phase of infection. The lowest concentration of SARS-CoV-2 viral copies this assay can detect is 138 copies/mL. A negative result does not preclude SARS-Cov-2 infection and should not be used as the sole basis for treatment or other patient management decisions. A negative result may occur with  improper specimen collection/handling, submission of specimen other than  nasopharyngeal swab, presence of viral mutation(s) within the areas targeted by this assay, and inadequate number of viral copies(<138 copies/mL). A negative result must be combined with clinical observations, patient history, and epidemiological information. The expected result is Negative.  Fact Sheet for Patients:  EntrepreneurPulse.com.au  Fact Sheet for Healthcare Providers:  IncredibleEmployment.be  This test is no t yet approved or cleared by the [place] FDA and  has been authorized for detection and/or diagnosis of SARS-CoV-2 by FDA under an Emergency Use Authorization (EUA). This EUA will remain  in effect (meaning this test can be used) for the duration of the COVID-19 declaration under Section 564(b)(1) of the Act, 21 U.S.C.section 360bbb-3(b)(1), unless the authorization is terminated  or revoked sooner.       Influenza A by PCR NEGATIVE NEGATIVE Final   Influenza B by PCR NEGATIVE NEGATIVE Final    Comment: (NOTE) The Xpert Xpress SARS-CoV-2/FLU/RSV plus assay is intended as an aid in the diagnosis of influenza from Nasopharyngeal swab specimens and should not be used as a sole basis for treatment. Nasal washings and aspirates are unacceptable for Xpert Xpress SARS-CoV-2/FLU/RSV testing.  Fact Sheet for Patients: EntrepreneurPulse.com.au  Fact Sheet for Healthcare Providers: IncredibleEmployment.be  This test is not yet approved or cleared by the [place] FDA and has been authorized for detection and/or diagnosis of SARS-CoV-2 by FDA under an Emergency Use Authorization (EUA). This EUA will remain in effect (meaning this test can be used) for the duration of the COVID-19 declaration under Section 564(b)(1) of the Act, 21 U.S.C. section 360bbb-3(b)(1), unless the authorization is terminated or revoked.  Performed at [facility], [place] [address], [place], [place] [address]   Blood culture (routine x 2)     Status: None   Collection Time: 04/10/21  4:42 PM   Specimen: BLOOD  Result Value Ref Range Status   Specimen Description   Final    BLOOD RIGHT ANTECUBITAL Performed at [place] [address], [place], [place] [address]    Special Requests   Final    BOTTLES DRAWN AEROBIC AND ANAEROBIC Blood Culture adequate volume Performed at [place] [address] [place], [place] [address]    Culture   Final    NO GROWTH 5 DAYS Performed at [facility], [place] [address], [place], [name] [address]    Report Status 04/15/2021 FINAL  Final  Aerobic/Anaerobic Culture w Gram Stain (surgical/deep wound)     Status: None   Collection Time: 04/10/21  7:56 PM   Specimen: Abscess  Result Value Ref Range Status   Specimen Description   Final    ABSCESS Performed at [place] [address] [place], [place] [address]    Special Requests   Final    NONE Performed at [facility], [place] [address] [place], [place] [address]  Gram Stain   Final    NO SQUAMOUS EPITHELIAL CELLS SEEN FEW WBC SEEN FEW GRAM POSITIVE COCCI    Culture   Final    FEW ESCHERICHIA COLI FEW ACTINOMYCES ODONTOLYTICUS Standardized susceptibility testing for this organism is not available. MODERATE BACTEROIDES OVATUS BETA LACTAMASE POSITIVE Performed at [facility], [place] [address], [place], [place] [address]    Report Status 04/13/2021 FINAL  Final   Organism ID, Bacteria ESCHERICHIA COLI  Final      Susceptibility   Escherichia coli - MIC*    AMPICILLIN >=32 RESISTANT Resistant     CEFAZOLIN 8 SENSITIVE Sensitive     CEFEPIME <=0.12 SENSITIVE Sensitive     CEFTAZIDIME <=1 SENSITIVE Sensitive     CEFTRIAXONE <=0.25 SENSITIVE Sensitive     CIPROFLOXACIN >=4 RESISTANT Resistant     GENTAMICIN <=1 SENSITIVE Sensitive     IMIPENEM <=0.25 SENSITIVE  Sensitive     TRIMETH/SULFA <=20 SENSITIVE Sensitive     AMPICILLIN/SULBACTAM >=32 RESISTANT Resistant     PIP/TAZO <=4 SENSITIVE Sensitive     * FEW ESCHERICHIA COLI  Urine Culture     Status: Abnormal   Collection Time: 04/11/21  1:09 PM   Specimen: Urine, Clean Catch  Result Value Ref Range Status   Specimen Description   Final    URINE, CLEAN CATCH Performed at [facility], [place] [address] [place], [place] [address]    Special Requests   Final    NONE Performed at [facility], [place] [address], [place], [place] [address]    Culture 70,000 COLONIES/mL ESCHERICHIA COLI (A)  Final   Report Status 04/13/2021 FINAL  Final   Organism ID, Bacteria ESCHERICHIA COLI (A)  Final      Susceptibility   Escherichia coli - MIC*    AMPICILLIN >=32 RESISTANT Resistant     CEFAZOLIN 8 SENSITIVE Sensitive     CEFEPIME <=0.12 SENSITIVE Sensitive     CEFTRIAXONE <=0.25 SENSITIVE Sensitive     CIPROFLOXACIN >=4 RESISTANT Resistant     GENTAMICIN <=1 SENSITIVE Sensitive     IMIPENEM <=0.25 SENSITIVE Sensitive     NITROFURANTOIN <=16 SENSITIVE Sensitive     TRIMETH/SULFA <=20 SENSITIVE Sensitive     AMPICILLIN/SULBACTAM >=32 RESISTANT Resistant     PIP/TAZO <=4 SENSITIVE Sensitive     * 70,000 COLONIES/mL ESCHERICHIA COLI  Surgical PCR screen     Status: None   Collection Time: 04/14/21  4:34 PM   Specimen: Nasal Mucosa; Nasal Swab  Result Value Ref Range Status   MRSA, PCR NEGATIVE NEGATIVE Final   Staphylococcus aureus NEGATIVE NEGATIVE Final    Comment: (NOTE) The Xpert SA Assay (FDA approved for NASAL specimens in patients 73 years of age and older), is one component of a comprehensive surveillance program. It is not intended to diagnose infection nor to guide or monitor treatment. Performed at [facility], [place] [address], [place], [place] [address]     Labs: Results for orders placed or performed during the hospital  encounter of 04/10/21 (from the past 48 hour(s))  Glucose, capillary     Status: Abnormal   Collection Time: 04/20/21 11:53 AM  Result Value Ref Range   Glucose-Capillary 102 (H) 70 - 99 mg/dL    Comment: Glucose reference range applies only to samples taken after fasting for at least 8 hours.  Glucose, capillary     Status: Abnormal   Collection Time: 04/20/21  5:28 PM  Result Value  Ref Range   Glucose-Capillary 105 (H) 70 - 99 mg/dL    Comment: Glucose reference range applies only to samples taken after fasting for at least 8 hours.   Comment 1 Notify RN    Comment 2 Document in Chart   CBC     Status: Abnormal   Collection Time: 04/20/21  6:47 PM  Result Value Ref Range   WBC 13.4 (H) 4.0 - 10.5 K/uL   RBC 2.61 (L) 3.87 - 5.11 MIL/uL   Hemoglobin 7.7 (L) 12.0 - 15.0 g/dL   HCT 23.7 (L) 36.0 - 46.0 %   MCV 90.8 80.0 - 100.0 fL   MCH 29.5 26.0 - 34.0 pg   MCHC 32.5 30.0 - 36.0 g/dL   RDW 17.4 (H) 11.5 - 15.5 %   Platelets 361 150 - 400 K/uL   nRBC 0.0 0.0 - 0.2 %    Comment: Performed at [facility], [place] [address], [place], [place] [address]  Glucose, capillary     Status: Abnormal   Collection Time: 04/20/21 11:22 PM  Result Value Ref Range   Glucose-Capillary 104 (H) 70 - 99 mg/dL    Comment: Glucose reference range applies only to samples taken after fasting for at least 8 hours.  Basic metabolic panel     Status: Abnormal   Collection Time: 04/21/21  5:00 AM  Result Value Ref Range   Sodium 131 (L) 135 - 145 mmol/L    Comment: DELTA CHECK NOTED   Potassium 2.9 (L) 3.5 - 5.1 mmol/L    Comment: DELTA CHECK NOTED   Chloride 99 98 - 111 mmol/L   CO2 26 22 - 32 mmol/L   Glucose, Bld 1,172 (HH) 70 - 99 mg/dL    Comment: Glucose reference range applies only to samples taken after fasting for at least 8 hours. DELTA CHECK NOTED CRITICAL RESULT CALLED TO, READ BACK BY AND VERIFIED WITH:  AMANDA WATSON RN 04/21/21 @ 0545 VS  NOTIFIED RN OF POSSIBLE TPN  CONTAMINATION RN MAY REQUEST REDRAW IF APPROPRIATE    BUN 22 (H) 6 - 20 mg/dL   Creatinine, Ser 0.53 0.44 - 1.00 mg/dL   Calcium 8.5 (L) 8.9 - 10.3 mg/dL   GFR, Estimated >60 >60 mL/min    Comment: (NOTE) Calculated using the CKD-EPI Creatinine Equation (2021)    Anion gap 6 5 - 15    Comment: Performed at [facility], [place] [address], [place], [place] [address]  Magnesium     Status: None   Collection Time: 04/21/21  5:00 AM  Result Value Ref Range   Magnesium 2.2 1.7 - 2.4 mg/dL    Comment: Performed at [facility], [place] [address] [place], [place] [address]  Phosphorus     Status: None   Collection Time: 04/21/21  5:00 AM  Result Value Ref Range   Phosphorus 2.5 2.5 - 4.6 mg/dL    Comment: Performed at [facility], [place] [address], [place], [place] [address]  CBC     Status: Abnormal   Collection Time: 04/21/21  5:00 AM  Result Value Ref Range   WBC 11.3 (H) 4.0 - 10.5 K/uL   RBC 2.28 (L) 3.87 - 5.11 MIL/uL   Hemoglobin 7.2 (L) 12.0 - 15.0 g/dL   HCT 21.8 (L) 36.0 - 46.0 %   MCV 95.6 80.0 - 100.0 fL   MCH 31.6 26.0 - 34.0 pg   MCHC 33.0 30.0 - 36.0 g/dL  RDW 17.6 (H) 11.5 - 15.5 %   Platelets 349 150 - 400 K/uL   nRBC 0.0 0.0 - 0.2 %    Comment: Performed at [facility], [place] [address], [place], [place] [address]  Glucose, capillary     Status: Abnormal   Collection Time: 04/21/21  5:37 AM  Result Value Ref Range   Glucose-Capillary 117 (H) 70 - 99 mg/dL    Comment: Glucose reference range applies only to samples taken after fasting for at least 8 hours.  Basic metabolic panel     Status: Abnormal   Collection Time: 04/21/21  6:00 AM  Result Value Ref Range   Sodium 139 135 - 145 mmol/L    Comment: DELTA CHECK NOTED   Potassium 2.8 (L) 3.5 - 5.1 mmol/L   Chloride 107 98 - 111 mmol/L   CO2 26 22 - 32 mmol/L   Glucose, Bld 94 70 - 99 mg/dL    Comment: Glucose reference range applies only  to samples taken after fasting for at least 8 hours.   BUN 21 (H) 6 - 20 mg/dL   Creatinine, Ser 0.35 (L) 0.44 - 1.00 mg/dL   Calcium 7.5 (L) 8.9 - 10.3 mg/dL   GFR, Estimated >60 >60 mL/min    Comment: (NOTE) Calculated using the CKD-EPI Creatinine Equation (2021)    Anion gap 6 5 - 15    Comment: Performed at [facility], [place] [address], [place], [place] [address]  Magnesium     Status: Abnormal   Collection Time: 04/21/21  6:00 AM  Result Value Ref Range   Magnesium 1.3 (L) 1.7 - 2.4 mg/dL    Comment: Performed at [facility], [place] [address] [place], [place] [address]  Phosphorus     Status: None   Collection Time: 04/21/21  6:00 AM  Result Value Ref Range   Phosphorus 4.1 2.5 - 4.6 mg/dL    Comment: Performed at [facility], [place] [address], [place], [place] [address]  Triglycerides     Status: None   Collection Time: 04/21/21  6:00 AM  Result Value Ref Range   Triglycerides 71 <150 mg/dL    Comment: Performed at [facility], [place] [address] [place], [place] [address]  Glucose, capillary     Status: Abnormal   Collection Time: 04/21/21 11:46 AM  Result Value Ref Range   Glucose-Capillary 112 (H) 70 - 99 mg/dL    Comment: Glucose reference range applies only to samples taken after fasting for at least 8 hours.   Comment 1 Notify RN    Comment 2 Document in Chart   Vitamin B12     Status: None   Collection Time: 04/21/21 12:04 PM  Result Value Ref Range   Vitamin B-12 374 180 - 914 pg/mL    Comment: (NOTE) This assay is not validated for testing neonatal or myeloproliferative syndrome specimens for Vitamin B12 levels. Performed at [facility], [place] [address], [place], [place] [address]   Glucose, capillary     Status: Abnormal   Collection Time: 04/21/21  5:36 PM  Result Value Ref Range   Glucose-Capillary 116 (H) 70 - 99 mg/dL    Comment: Glucose reference range applies  only to samples taken after fasting for at least 8 hours.   Comment 1 Notify RN    Comment 2 Document in Chart   Glucose, capillary     Status: Abnormal   Collection Time:  04/21/21 11:10 PM  Result Value Ref Range   Glucose-Capillary 124 (H) 70 - 99 mg/dL    Comment: Glucose reference range applies only to samples taken after fasting for at least 8 hours.  Magnesium     Status: None   Collection Time: 04/22/21  4:18 AM  Result Value Ref Range   Magnesium 1.9 1.7 - 2.4 mg/dL    Comment: Performed at [facility], [place] [address] [place], [place] [address]  Phosphorus     Status: Abnormal   Collection Time: 04/22/21  4:18 AM  Result Value Ref Range   Phosphorus 1.4 (L) 2.5 - 4.6 mg/dL    Comment: Performed at [facility], [place] [address] [place], [place] [address]  CBC     Status: Abnormal   Collection Time: 04/22/21  4:18 AM  Result Value Ref Range   WBC 11.6 (H) 4.0 - 10.5 K/uL   RBC 2.53 (L) 3.87 - 5.11 MIL/uL   Hemoglobin 7.3 (L) 12.0 - 15.0 g/dL   HCT 22.9 (L) 36.0 - 46.0 %   MCV 90.5 80.0 - 100.0 fL   MCH 28.9 26.0 - 34.0 pg   MCHC 31.9 30.0 - 36.0 g/dL   RDW 16.0 (H) 11.5 - 15.5 %   Platelets 421 (H) 150 - 400 K/uL   nRBC 0.0 0.0 - 0.2 %    Comment: Performed at [facility], [place] [address], [place], [place] [address]  Comprehensive metabolic panel     Status: Abnormal   Collection Time: 04/22/21  4:18 AM  Result Value Ref Range   Sodium 136 135 - 145 mmol/L   Potassium 3.3 (L) 3.5 - 5.1 mmol/L   Chloride 102 98 - 111 mmol/L   CO2 29 22 - 32 mmol/L   Glucose, Bld 107 (H) 70 - 99 mg/dL    Comment: Glucose reference range applies only to samples taken after fasting for at least 8 hours.   BUN 25 (H) 6 - 20 mg/dL   Creatinine, Ser 0.31 (L) 0.44 - 1.00 mg/dL   Calcium 8.4 (L) 8.9 - 10.3 mg/dL   Total Protein 5.2 (L) 6.5 - 8.1 g/dL   Albumin 2.4 (L) 3.5 - 5.0 g/dL   AST 12 (L) 15 - 41 U/L   ALT 9 0 - 44 U/L    Alkaline Phosphatase 63 38 - 126 U/L   Total Bilirubin 0.4 0.3 - 1.2 mg/dL   GFR, Estimated >60 >60 mL/min    Comment: (NOTE) Calculated using the CKD-EPI Creatinine Equation (2021)    Anion gap 5 5 - 15    Comment: Performed at [facility], [place] [address], [place], [place] [address]  Glucose, capillary     Status: Abnormal   Collection Time: 04/22/21  5:17 AM  Result Value Ref Range   Glucose-Capillary 102 (H) 70 - 99 mg/dL    Comment: Glucose reference range applies only to samples taken after fasting for at least 8 hours.    Imaging / Studies: No results found.  Medications / Allergies: per chart  Antibiotics: Anti-infectives (From admission, onward)    Start     Dose/Rate Route Frequency Ordered Stop   04/17/21 1700  ceFEPIme (MAXIPIME) 2 g in sodium chloride 0.9 % 100 mL IVPB  Status:  Discontinued        2 g 200 mL/hr over 30 Minutes Intravenous Every 8 hours 04/17/21 1530 04/21/21 0808   04/17/21 1700  metroNIDAZOLE (FLAGYL)  IVPB 500 mg  Status:  Discontinued        500 mg 100 mL/hr over 60 Minutes Intravenous Every 12 hours 04/17/21 1530 04/21/21 0808   04/15/21 1000  anidulafungin (ERAXIS) 100 mg in sodium chloride 0.9 % 100 mL IVPB  Status:  Discontinued        100 mg 78 mL/hr over 100 Minutes Intravenous Every 24 hours 04/14/21 0744 04/21/21 0808   04/15/21 0900  clindamycin (CLEOCIN) IVPB 900 mg       See Hyperspace for full Linked Orders Report.   900 mg 100 mL/hr over 30 Minutes Intravenous 60 min pre-op 04/14/21 1405 04/15/21 1142   04/15/21 0900  gentamicin (GARAMYCIN) 340 mg in dextrose 5 % 100 mL IVPB       See Hyperspace for full Linked Orders Report.   5 mg/kg  67.7 kg (Adjusted) 108.5 mL/hr over 60 Minutes Intravenous 60 min pre-op 04/14/21 1405 04/15/21 1834   04/14/21 1000  anidulafungin (ERAXIS) 200 mg in sodium chloride 0.9 % 200 mL IVPB        200 mg 78 mL/hr over 200 Minutes Intravenous  Once 04/14/21 0755 04/14/21 1500    04/13/21 0900  meropenem (MERREM) 1 g in sodium chloride 0.9 % 100 mL IVPB  Status:  Discontinued        1 g 200 mL/hr over 30 Minutes Intravenous Every 8 hours 04/13/21 0800 04/17/21 1530   04/10/21 2200  cefTRIAXone (ROCEPHIN) 2 g in sodium chloride 0.9 % 100 mL IVPB  Status:  Discontinued        2 g 200 mL/hr over 30 Minutes Intravenous Daily at bedtime 04/10/21 2044 04/13/21 0746   04/10/21 2200  metroNIDAZOLE (FLAGYL) IVPB 500 mg  Status:  Discontinued        500 mg 100 mL/hr over 60 Minutes Intravenous Every 12 hours 04/10/21 2044 04/13/21 0745         Note: Portions of this report may have been transcribed using voice recognition software. Every effort was made to ensure accuracy; however, inadvertent computerized transcription errors may be present.   Any transcriptional errors that result from this process are unintentional.    Adin Hector, MD, FACS, MASCRS Esophageal, Gastrointestinal & Colorectal Surgery Robotic and Minimally Invasive Surgery  [facility], [place]  [place]. [address] [place], [place] [address] [phone] Fax [phone] Main  CONTACT INFORMATION:  Weekday (9AM-5PM): Call [facility] main office at [phone]  Weeknight (5PM-9AM) or Weekend/Holiday: Check [email or web address] (password " [email or web address]") for General Surgery [facility] coverage  (Please, do not use SecureChat as it is not reliable communication to operating surgeons for immediate patient care)      04/22/2021  7:54 AM

## 2021-04-22 NOTE — Progress Notes (Signed)
PROGRESS NOTE    Mikayla Rios  SWF:[id number] DOB: 05/31/1986 DOA: 04/10/2021 PCP: [name], MD   Brief Narrative:  The patient is a 35 year old obese [name] female with a past medical history significant for [place] to hypertension, inappropriate sinus tachycardia, obesity, obstructive sleep apnea, history of diverticulosis with complicated diverticulitis, history of abscess formation on 622 22 status post IR guided placement to left lower quadrant drains who presented to [facility] long ED with complaints of generalized weakness and lightheadedness.  She is found to have any abscess measuring about 8.5 x 10 cm extending into the left lateral abdominal wall.  She underwent drain exchange and upsizing by IR on Monday and then subsequently underwent a colectomy and colostomy with small bowel resection and retroperitoneal abscess drainage on 04/15/2021.  Postoperatively she was monitored in the ICU the vent and briefly required pressors.  She is extubated 04/16/2021 and has since been weaned off of pressors.  She was transferred back to [facility] on 04/18/2021 CIR is evaluating her for placement.  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  Medical oncology has been consulted and they feel that she would be benefit and from post adjuvant chemotherapy and perhaps focal radiation therapy.  She is will be discussed at the tumor board.  There will be consideration of Port-A-Cath placement while she is here and general surgery feels that they are unlikely attempt colostomy takedown and anastomosis for least a year or so until there is no local recurrence or metastatic disease.  She continues to be tachycardic.  Assessment & Plan:   Principal Problem:   Diverticulitis of large intestine with abscess Active Problems:   Class 3 severe obesity due to excess calories with serious comorbidity and body mass index (BMI) of 60.0 to 69.9 in adult [facility])   Obstructive sleep apnea   Anemia    Inappropriate sinus tachycardia   Essential hypertension   SIRS (systemic inflammatory response syndrome) (HCC)   Hypokalemia, inadequate intake   Protein-calorie malnutrition, severe (HCC)   Reactive thrombocytosis   Prolonged QT interval  Complicated diverticulitis with abscess s/p drain placement by IR twice in June 2022 with numerous exchanges since then due to persistent fistulous -CT of the abdomen and pelvis New lobulated enhancing fluid collection posterior to this segment of inflamed colon measuring 8.5 x 3.5 x 10.0 cm. This collection now invades the adjacent iliopsoas muscle as well as extends through the left lateral abdominal wall. -Patient has had prolonged treatment with antibiotics and multiple drain placements, but despite conservative measures, she has failed to improve and now has a new abscess -Seen by general surgery and she underwent colectomy/colostomy and drainage of retroperitoneal abscess -She is completed a course of IV antibiotics -Has been removed and diet is being advanced per general surgery -She has wound VAC over abdominal wound -Defer further postop care to surgery and they had a long discussion with the patient   Septic shock -Secondary to retroperitoneal abscess -Patient required vasopressor therapy postoperatively, but has been able to wean off pressors -Blood pressures now elevated -She has completed antibiotics   Acute respiratory failure with hypoxia requiring mechanical ventilation -Patient was intubated for operative management and due to hemodynamic instability postoperatively, decision was made to leave the patient intubated and monitored in the ICU -She was able to extubate on 11/10 and respiratory status has been stable   Adenocarcinoma colon -New diagnosis -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   Mechanical Fall:  -Patient reports dizziness and an episode of syncope and an episode of mechanical fall possibly from orthostatic hypotension.  -Monitor on telemetry. -CT angiogram of the chest ruled out PE -EKG shows prolonged QT interval 636.  Nonspecific T wave inversions.troponin is negative. Patient denies any chest pain or shortness of breath. -Probably secondary to orthostatic hypotension from infection. -Overall QTC has improved   Hypokalemia Hypophosphatemia -Currently being replaced with TPN protocol -Continue to monitor and replete as necessary   Prolonged QTc: -From hypokalemia and hypomagnesemia. -K is 3.3 today. magnesium is 1.9. -Keep k >4 and mag>2.  -Repeat EKG shows improvement in the qtC.     Anemia of chronic disease/ Acute anemia of acute illness -Normocytic.  -Baseline creatinine appears to be between 7-8 -Patient noted to have significant bleeding in ostomy -She was transfused a total of 7 units prbc thus far -Hgb currently at 7.2 -Continue to follow CBC Transfuse to keep hemoglobin greater than 7.   Post op bleeding from ostomy -concern it may be bleeding at small bowel anastomosis -defer further intervention if needed to general surgery -She received 2 units of FFP on 11/13 -Overall bleeding seems to be improving -continue to follow hemoglobin and supportive care with PRBC transfusions   Anasarca -related to hypoalbuminemia and aggressive volume repletion -Received albumin infusion and IV lasix 20 mg twice daily with good urine output -Swelling appears to be improving   Hypoalbuminemia  -will get dietary consult.  -currently on TPN since NPO diet was advanced to soft diet today   Sinus tachycardia  Probably from anemia and infection and pain.  Pt not symptomatic.  Echocardiogram unremarkable -Continue on IV Lopressor and was transitioned to 12.5 mg p.o. twice daily and  will go to 25 mg p.o. twice daily -She does have a history of inappropriate sinus tachycardia   Hypertension  Currently on IV lopressor and prn hydralazine -Restart on oral Lopressor once able and was resumed on 12.5 mg and will change to 25 mg twice daily -Continue to monitor blood pressures per protocol -Last blood pressure reading was   DVT prophylaxis: SCDs Code Status: Full code Family Communication: Discussed with family at bedside Disposition Plan: Pending further clinical improvement  Status is: Inpatient  Remains inpatient appropriate because: She is not yet back to her baseline and will need to tolerate her diet and be off the TPN for safe discharge disposition as she will need to also go to CIR  Consultants:  PCCM transfer General surgery Medical oncology Cardiology Gastroenterology  Procedures:  Laparoscopic left colectomy/end colostomy, small bowel resection, left retroperitoneal/flank abscess drainage   ETT 11/09>11/10  LE Venous Duplex   Antimicrobials:  Anti-infectives (From admission, onward)    Start     Dose/Rate Route Frequency Ordered Stop   04/17/21 1700  ceFEPIme (MAXIPIME) 2 g in sodium chloride 0.9 % 100 mL IVPB  Status:  Discontinued        2 g 200 mL/hr over 30 Minutes Intravenous Every 8 hours 04/17/21 1530 04/21/21 0808   04/17/21 1700  metroNIDAZOLE (FLAGYL) IVPB 500 mg  Status:  Discontinued        500 mg 100 mL/hr over 60 Minutes Intravenous Every 12 hours 04/17/21 1530 04/21/21 0808   04/15/21 1000  anidulafungin (ERAXIS) 100 mg in sodium chloride 0.9 % 100 mL IVPB  Status:  Discontinued  100 mg 78 mL/hr over 100 Minutes Intravenous Every 24 hours 04/14/21 0744 04/21/21 0808   04/15/21 0900  clindamycin (CLEOCIN) IVPB 900 mg       See Hyperspace for full Linked Orders Report.   900 mg 100 mL/hr over 30 Minutes Intravenous 60 min pre-op 04/14/21 1405 04/15/21 1142   04/15/21 0900  gentamicin (GARAMYCIN) 340 mg in dextrose 5 % 100  mL IVPB       See Hyperspace for full Linked Orders Report.   5 mg/kg  67.7 kg (Adjusted) 108.5 mL/hr over 60 Minutes Intravenous 60 min pre-op 04/14/21 1405 04/15/21 1834   04/14/21 1000  anidulafungin (ERAXIS) 200 mg in sodium chloride 0.9 % 200 mL IVPB        200 mg 78 mL/hr over 200 Minutes Intravenous  Once 04/14/21 0755 04/14/21 1500   04/13/21 0900  meropenem (MERREM) 1 g in sodium chloride 0.9 % 100 mL IVPB  Status:  Discontinued        1 g 200 mL/hr over 30 Minutes Intravenous Every 8 hours 04/13/21 0800 04/17/21 1530   04/10/21 2200  cefTRIAXone (ROCEPHIN) 2 g in sodium chloride 0.9 % 100 mL IVPB  Status:  Discontinued        2 g 200 mL/hr over 30 Minutes Intravenous Daily at bedtime 04/10/21 2044 04/13/21 0746   04/10/21 2200  metroNIDAZOLE (FLAGYL) IVPB 500 mg  Status:  Discontinued        500 mg 100 mL/hr over 60 Minutes Intravenous Every 12 hours 04/10/21 2044 04/13/21 0745        Subjective: Seen and examined and was having some pain today.  About to be cleaned up.  Heart rate was elevated but she has a history of inappropriate sinus tachycardia.  No nausea or vomiting.  Felt okay.  Hoping to improve and with her diet advancement.  No other concerns or complaints this time.  Objective: Vitals:   04/22/21 1400 04/22/21 1500 04/22/21 1635 04/22/21 1800  BP: 129/82 124/76  133/83  Pulse: (!) 136 (!) 133    Resp: (!) 25 (!) 27  (!) 25  Temp:   98.4 F (36.9 C)   TempSrc:   Oral   SpO2: 100% 100%    Weight:      Height:        Intake/Output Summary (Last 24 hours) at 04/22/2021 1835 Last data filed at 04/22/2021 1831 Gross per 24 hour  Intake 2644.07 ml  Output 1230 ml  Net 1414.07 ml   Filed Weights   04/10/21 1436 04/15/21 0808 04/15/21 1800  Weight: 90.7 kg 123.6 kg 99.5 kg   Examination: Physical Exam:  Constitutional: WN/WD morbidly obese African-American female currently in NAD and appears a little anxious and uncomfortable Eyes: Lids and  conjunctivae normal, sclerae anicteric  ENMT: External Ears, Nose appear normal. Grossly normal hearing. Mucous membranes are moist.  Neck: Appears normal, supple, no cervical masses, normal ROM, no appreciable thyromegaly; JVD unable to be assessed given her body habitus Respiratory: Diminished to auscultation bilaterally, no wheezing, rales, rhonchi or crackles. Normal respiratory effort and patient is not tachypenic. No accessory muscle use.  Unlabored breathing Cardiovascular: RRR, no murmurs / rubs / gallops. S1 and S2 auscultated.  1+ lower extremity edema Abdomen: Soft, non-tender, distended secondary body habitus.  Colostomy in place bowel sounds positive.  GU: Deferred. Musculoskeletal: No clubbing / cyanosis of digits/nails. No joint deformity upper and lower extremities. Skin: No rashes, lesions, ulcers on limited skin evaluation. No  induration; Warm and dry.  Neurologic: CN 2-12 grossly intact with no focal deficits.  Romberg sign and cerebellar reflexes not assessed.  Psychiatric: Normal judgment and insight. Alert and oriented x 3.  Anxious mood and appropriate affect.   Data Reviewed: I have personally reviewed following labs and imaging studies  CBC: Recent Labs  Lab 04/20/21 0053 04/20/21 0500 04/20/21 1847 04/21/21 0500 04/22/21 0418  WBC 13.3* 12.1* 13.4* 11.3* 11.6*  HGB 7.9* 7.5* 7.7* 7.2* 7.3*  HCT 23.0* 21.9* 23.7* 21.8* 22.9*  MCV 88.8 89.4 90.8 95.6 90.5  PLT 326 313 361 349 812*   Basic Metabolic Panel: Recent Labs  Lab 04/19/21 0214 04/20/21 0530 04/21/21 0500 04/21/21 0600 04/22/21 0418  NA 140 138 131* 139 136  K 4.5 5.3* 2.9* 2.8* 3.3*  CL 112* 105 99 107 102  CO2 _0 GLUCOSE 105* 432* 1,172* 94 107*  BUN 25* 20 22* 21* 25*  CREATININE 0.41* 0.36* 0.53 0.35* 0.31*  CALCIUM 7.4* 8.5* 8.5* 7.5* 8.4*  MG 2.0 2.0 2.2 1.3* 1.9  PHOS 3.1 4.8* 2.5 4.1 1.4*   GFR: Estimated Creatinine Clearance: 108.2 mL/min (A) (by C-G formula based  on SCr of 0.31 mg/dL (L)). Liver Function Tests: Recent Labs  Lab 04/16/21 0500 04/17/21 0500 04/19/21 0214 04/20/21 0530 04/22/21 0418  AST 15 13* 10* 10* 12*  ALT _1 ALKPHOS 62 51 47 43 63  BILITOT 0.6 0.5 0.3 0.5 0.4  PROT 4.2* 4.2* 4.0* 4.9* 5.2*  ALBUMIN 1.7* 2.0* 1.5* 2.7* 2.4*   No results for input(s): LIPASE, AMYLASE in the last 168 hours. No results for input(s): AMMONIA in the last 168 hours. Coagulation Profile: Recent Labs  Lab 04/19/21 1500  INR 1.2   Cardiac Enzymes: No results for input(s): CKTOTAL, CKMB, CKMBINDEX, TROPONINI in the last 168 hours. BNP (last 3 results) No results for input(s): PROBNP in the last 8760 hours. HbA1C: No results for input(s): HGBA1C in the last 72 hours. CBG: Recent Labs  Lab 04/21/21 1736 04/21/21 2310 04/22/21 0517 04/22/21 1242 04/22/21 1728  GLUCAP 116* 124* 102* 114* 105*   Lipid Profile: Recent Labs    04/20/21 0530 04/21/21 0600  TRIG 246* 71   Thyroid Function Tests: No results for input(s): TSH, T4TOTAL, FREET4, T3FREE, THYROIDAB in the last 72 hours. Anemia Panel: Recent Labs    04/21/21 1204  VITAMINB12 374   Sepsis Labs: Recent Labs  Lab 04/20/21 0052 04/20/21 0335  LATICACIDVEN 1.1 1.0    Recent Results (from the past 240 hour(s))  Surgical PCR screen     Status: None   Collection Time: 04/14/21  4:34 PM   Specimen: Nasal Mucosa; Nasal Swab  Result Value Ref Range Status   MRSA, PCR NEGATIVE NEGATIVE Final   Staphylococcus aureus NEGATIVE NEGATIVE Final    Comment: (NOTE) The Xpert SA Assay (FDA approved for NASAL specimens in patients 30 years of age and older), is one component of a comprehensive surveillance program. It is not intended to diagnose infection nor to guide or monitor treatment. Performed at [facility], [place] [address], [place], [place] [address]     RN Pressure Injury Documentation:     Estimated body mass index is 38.86 kg/m as  calculated from the following:   Height as of this encounter: _2  (1.6 m).   Weight as of this encounter: 99.5 kg.  Malnutrition Type:  Nutrition Problem: Inadequate oral intake Etiology: acute  illness, inability to eat  Malnutrition Characteristics:  Signs/Symptoms: per patient/family report, NPO status  Nutrition Interventions:  Interventions: TPN   Radiology Studies: No results found.  Scheduled Meds:  acetaminophen  1,000 mg Oral Q6H   chlorhexidine gluconate (MEDLINE KIT)  15 mL Mouth Rinse BID   Chlorhexidine Gluconate Cloth  6 each Topical Daily   diclofenac Sodium  4 g Topical QID   furosemide  20 mg Intravenous BID   insulin aspart  0-9 Units Subcutaneous Q6H   lip balm  1 application Topical BID   metoprolol tartrate  12.5 mg Oral BID   pantoprazole (PROTONIX) IV  40 mg Intravenous Daily   Continuous Infusions:  sodium chloride Stopped (04/19/21 1232)   methocarbamol (ROBAXIN) IV     TPN ADULT (ION) 90 mL/hr at 04/22/21 1831    LOS: 12 days   Kerney Elbe, DO [facility] PAGER is on [place]  If 7PM-7AM, please contact night-coverage [email or web address]

## 2021-04-22 NOTE — TOC Progression Note (Addendum)
Transition of Care [facility]) - Progression Note    Patient Details  Name: Mikayla Rios MRN: [id number] Date of Birth: [birth year]  Transition of Care [facility]) CM/SW Contact  Leeroy Cha, RN Phone Number: 04/22/2021, 8:52 AM  Clinical Narrative:    Patient s/p urgent Hartmann colectomy/colostomy & SB resection for colon perforation\ surgery on 04/15/2021 Pt still in [facility] hospital recovering     Dx:  T4N0 colon cancer TOC PLAN OF CARE: Following for dc needs and progression.  CIR is following for post hospital admission for rehab.  Expected Discharge Plan: Home/Self Care Barriers to Discharge: Continued Medical Work up  Expected Discharge Plan and Services Expected Discharge Plan: Home/Self Care   Discharge Planning Services: CM Consult   Living arrangements for the past 2 months: Apartment                                       Social Determinants of Health (SDOH) Interventions    Readmission Risk Interventions No flowsheet data found.

## 2021-04-22 NOTE — Progress Notes (Signed)
Inpatient Rehab Admissions Coordinator:   Pt. Is not yet medically ready for CIR. I will follow for potential admit pending insurance auth and bed availability.  Clemens Catholic, [place], [place] Admissions Coordinator  [phone] ([place]) [phone] (office)

## 2021-04-22 NOTE — Progress Notes (Signed)
Occupational Therapy Treatment Patient Details Name: Mikayla Rios MRN: [id number] DOB: [birth year] Today's Date: 04/22/2021   History of present illness Mikayla Rios is a 35 yo female presents with complaints of generalized weakness and lightheadedness. PMH: HTN, sinus tachny, obesity, obstructive sleep apnea and diverticulosis with complicated diverticulitis and abscess formation 11/2020 status post IR guided placement of 2 left lower quadrant drains.  S/PP LAPAROSCOPIC ASSISTED HARTMANN  (LEFT COLECTOMY / END COLOSTOMY), SMALL BOWEL RESECTION, LEFT RETROPERITONEAL / [place] ABSCESS DRAINAGE 04/15/21   OT comments  Treatment focused on functional mobility with PT and exercises. Patient able to sit edge of bed > than 5 minutes and closer to 10 minutes for exercises and assistance to wash back. Patient mod x 2 from elevated surface to power up in to standing. Min guard x 2 for safety and lines/leads to take steps to recliner. Patient able to stand in place before sitting. However HR up to 170 with minimal activity therefore further mobilization deferred. Patient educated on exercises to perform in chair and even in supine in bed to improve upper body strength. Patient making progress and highly motivated. Continue to recommend CIR at discharge.    Recommendations for follow up therapy are one component of a multi-disciplinary discharge planning process, led by the attending physician.  Recommendations may be updated based on patient status, additional functional criteria and insurance authorization.    Follow Up Recommendations  Acute inpatient rehab (3hours/day)    Assistance Recommended at Discharge Frequent or constant Supervision/Assistance  Equipment Recommendations  BSC/3in1    Recommendations for Other Services      Precautions / Restrictions Precautions Precautions: Fall Precaution Comments: monitor HR, colostomy, R drain, VAC Restrictions Weight Bearing Restrictions: No        Mobility Bed Mobility Overal bed mobility: Needs Assistance Bed Mobility: Sidelying to Sit;Rolling Rolling: Mod assist Sidelying to sit: Mod assist       General bed mobility comments: assistance to move LLE and trunk negotiation to transfer into sitting. Needed increased time and use of bed rail as well.    Transfers Overall transfer level: Needs assistance Equipment used: Rolling walker (2 wheels) Transfers: Sit to/from Stand;Bed to chair/wheelchair/BSC Sit to Stand: Mod assist;+2 physical assistance;+2 safety/equipment;From elevated surface Stand pivot transfers: Min guard;+2 safety/equipment         General transfer comment: Mod assist x 2 to stand and stabilize. Decreased to min guard x 2 with RW to take steps to recilner. +2 assistance for safety and line management.     Balance Overall balance assessment: Needs assistance Sitting-balance support: No upper extremity supported Sitting balance-Leahy Scale: Fair Sitting balance - Comments: seated EOB   Standing balance support: During functional activity Standing balance-Leahy Scale: Poor Standing balance comment: reliant on walker                           ADL either performed or assessed with clinical judgement   ADL Overall ADL's : Needs assistance/impaired                     Lower Body Dressing: Total assistance Lower Body Dressing Details (indicate cue type and reason): total assist to don socks.               General ADL Comments: patient sat at edge of bed for > than 5 minutes. Therapist assisted patient with washing her back and applying lotion.    Extremity/Trunk Assessment  Vision Patient Visual Report: No change from baseline     Perception     Praxis      Cognition Arousal/Alertness: Awake/alert Behavior During Therapy: WFL for tasks assessed/performed Overall Cognitive Status: Within Functional Limits for tasks assessed                                             Exercises Other Exercises Other Exercises: shoulder flexion/ reaching with minimal active assist x 10 each arm. Educated on what activities/exercises to perform in chair and in bed.   Shoulder Instructions       General Comments      Pertinent Vitals/ Pain       Pain Assessment: Faces Faces Pain Scale: Hurts even more Pain Location: abdomen Pain Descriptors / Indicators: Grimacing;Guarding;Restless  Home Living                                          Prior Functioning/Environment              Frequency  Min 2X/week        Progress Toward Goals  OT Goals(current goals can now be found in the care plan section)  Progress towards OT goals: Progressing toward goals  Acute Rehab OT Goals Patient Stated Goal: to walk to bathroom Time For Goal Achievement: 04/26/21 Potential to Achieve Goals: Good  Plan Discharge plan remains appropriate    Co-evaluation                 AM-PAC OT "6 Clicks" Daily Activity     Outcome Measure   Help from another person eating meals?: A Little Help from another person taking care of personal grooming?: A Lot Help from another person toileting, which includes using toliet, bedpan, or urinal?: Total Help from another person bathing (including washing, rinsing, drying)?: A Lot Help from another person to put on and taking off regular upper body clothing?: A Lot Help from another person to put on and taking off regular lower body clothing?: Total 6 Click Score: 11    End of Session Equipment Utilized During Treatment: Gait belt;Rolling walker (2 wheels)  OT Visit Diagnosis: Unsteadiness on feet (R26.81);Other abnormalities of gait and mobility (R26.89);History of falling (Z91.81);Muscle weakness (generalized) (M62.81);Pain   Activity Tolerance Patient tolerated treatment well   Patient Left with call bell/phone within reach;with bed alarm set;in chair;with family/visitor  present   Nurse Communication Mobility status        Time: 9702-6378 OT Time Calculation (min): 36 min  Charges: OT General Charges $OT Visit: 1 Visit OT Treatments $Therapeutic Exercise: 8-22 mins  Derl Barrow, OTR/L [place]  Office [phone] Pager: [place] 04/22/2021, 1:52 PM

## 2021-04-22 NOTE — Progress Notes (Signed)
Wasted 31mLs of Hydromorphone (Dilaudid) 1mg /mL PCA injection with Altamese Derby, RN in the stericycle.

## 2021-04-22 NOTE — Progress Notes (Signed)
Physical Therapy Treatment Patient Details Name: Mikayla Rios MRN: [id number] DOB: 09-22-1985 Today's Date: 04/22/2021   History of Present Illness Mikayla Rios is a 35 yo female presents with complaints of generalized weakness and lightheadedness. PMH: HTN, sinus tachny, obesity, obstructive sleep apnea and diverticulosis with complicated diverticulitis and abscess formation 11/2020 status post IR guided placement of 2 left lower quadrant drains.  S/PP LAPAROSCOPIC ASSISTED HARTMANN  (LEFT COLECTOMY / END COLOSTOMY), SMALL BOWEL RESECTION, LEFT RETROPERITONEAL / [place] ABSCESS DRAINAGE 04/15/21    PT Comments    Patient reports feeling stronger. HR at rest in bed 138, during mobility up to 171. RN aware but stated tele monitoring did not alert RN  Patient tolerated very well, stronger in  standing and taking several steps to recliner.  Continue PT for mobility.   Recommendations for follow up therapy are one component of a multi-disciplinary discharge planning process, led by the attending physician.  Recommendations may be updated based on patient status, additional functional criteria and insurance authorization.  Follow Up Recommendations  Acute inpatient rehab (3hours/day)     Assistance Recommended at Discharge Frequent or constant Supervision/Assistance  Equipment Recommendations  Rolling walker (2 wheels)    Recommendations for Other Services       Precautions / Restrictions Precautions Precautions: Fall Precaution Comments: monitor HR, colostomy, R drain, VAC Restrictions Weight Bearing Restrictions: No     Mobility  Bed Mobility Overal bed mobility: Needs Assistance Bed Mobility: Sidelying to Sit;Rolling Rolling: Mod assist Sidelying to sit: Mod assist       General bed mobility comments: assistance to move LLE and trunk negotiation to transfer into sitting. Needed increased time and use of bed rail as well.    Transfers Overall transfer level: Needs  assistance Equipment used: Rolling walker (2 wheels) Transfers: Sit to/from Stand;Bed to chair/wheelchair/BSC Sit to Stand: Mod assist;+2 physical assistance;+2 safety/equipment;From elevated surface Stand pivot transfers: Min guard;+2 safety/equipment         General transfer comment: Mod assist x 2 to stand and stabilize. Decreased to min guard x 2 with RW to take steps to recilner. +2 assistance for safety and line management.    Ambulation/Gait                   Stairs             Wheelchair Mobility    Modified Rankin (Stroke Patients Only)       Balance Overall balance assessment: Needs assistance Sitting-balance support: No upper extremity supported Sitting balance-Leahy Scale: Fair Sitting balance - Comments: seated EOB   Standing balance support: During functional activity Standing balance-Leahy Scale: Poor Standing balance comment: reliant on walker                            Cognition Arousal/Alertness: Awake/alert Behavior During Therapy: WFL for tasks assessed/performed Overall Cognitive Status: Within Functional Limits for tasks assessed                                          Exercises General Exercises - Lower Extremity Ankle Circles/Pumps: AROM;Both;10 reps Long Arc Quad: AAROM;Right;10 reps Other Exercises Other Exercises: shoulder flexion/ reaching with minimal active assist x 10 each arm. Educated on what activities/exercises to perform in chair and in bed.    General Comments  Pertinent Vitals/Pain Pain Assessment: Faces Faces Pain Scale: Hurts even more Pain Location: abdomen Pain Descriptors / Indicators: Grimacing;Guarding;Restless Pain Intervention(s): Premedicated before session;Monitored during session    Home Living                          Prior Function            PT Goals (current goals can now be found in the care plan section)      Frequency    Min  3X/week      PT Plan Current plan remains appropriate    Co-evaluation PT/OT/SLP Co-Evaluation/Treatment: Yes Reason for Co-Treatment: For patient/therapist safety PT goals addressed during session: Mobility/safety with mobility OT goals addressed during session: ADL's and self-care      AM-PAC PT "6 Clicks" Mobility   Outcome Measure  Help needed turning from your back to your side while in a flat bed without using bedrails?: A Lot Help needed moving from lying on your back to sitting on the side of a flat bed without using bedrails?: A Lot Help needed moving to and from a bed to a chair (including a wheelchair)?: A Lot Help needed standing up from a chair using your arms (e.g., wheelchair or bedside chair)?: Total Help needed to walk in hospital room?: Total Help needed climbing 3-5 steps with a railing? : Total 6 Click Score: 9    End of Session Equipment Utilized During Treatment: Gait belt Activity Tolerance: Patient tolerated treatment well Patient left: in chair;with call bell/phone within reach;with family/visitor present Nurse Communication: Mobility status PT Visit Diagnosis: Unsteadiness on feet (R26.81);Other abnormalities of gait and mobility (R26.89);Muscle weakness (generalized) (M62.81);Pain     Time: 1155-1230 PT Time Calculation (min) (ACUTE ONLY): 35 min  Charges:  $Therapeutic Activity: 8-22 mins                     Tresa Endo PT Acute Rehabilitation Services Pager [phone] Office [phone]    Claretha Cooper 04/22/2021, 3:04 PM

## 2021-04-22 NOTE — Progress Notes (Signed)
PHARMACY - TOTAL PARENTERAL NUTRITION CONSULT NOTE   Indication:  intolerance to enteral feeding  Patient Measurements: Height: 5\' 3"  (160 cm) Weight: 99.5 kg (219 lb 5.7 oz) IBW/kg (Calculated) : 52.4 TPN AdjBW (KG): 62 Body mass index is 38.86 kg/m. Usual Weight: 90.7  Assessment: 35 yo F s/p laparoscopic assisted Hartmann (left colectomy / end colostomy), small bowel resection, left retroperitoneal / flank abscess drainage on 04/15/21.   Start TPN per pharmacy for nutrition support.  PMH:  hypertension, inappropriate sinus tachycardia, obesity, obstructive sleep apnea and diverticulosis with complicated diverticulitis and abscess formation 11/2020 status post IR guided placement of 2 left lower quadrant drains  Glucose / Insulin: no hx DM; A1c 4.6; no PTA medications. - CBGs 102-124 (goal 150-180) - SSI 1 unit / 24 hrs Electrolytes: K 3.3, phos 1.4, others WNL, CorrCa 9.7 - hx prolonged QTC, goal K =>4 and Mg =>2 Renal:  WNL Hepatic: LFTs WNL, 71 (11/15) Intake / Output; MIVF: UOP 2800 ml/24 hrs  - Drains down 70 mls; NG out; Stool down 75 mL - no mIVF.  - Lasix 20 BID GI Imaging: GI Surgeries / Procedures:  11/9 Lap Hartmann (left colectomy / end colostomy), small bowel resection, left retroperitoneal / flank abscess drainage  Central access: PICC replaced 11/11 TPN start date: 04/16/21  Nutritional Goals:  Goal TPN rate 90 mL/hr provides 119 g of protein and 2225 kcals per day  RD Assessment: (11/11) Estimated Needs Total Energy Estimated Needs: 2150-2400 kcal Total Protein Estimated Needs: 110-125 grams Total Fluid Estimated Needs: >/= 2.2 L/day  Current Nutrition:  Full liquid diet ordered 11/15  Plan:  Now:  Magnesium sulfate 2 g iv once Potassium phosphate 45 mmol iv once  At 1800: Continue TPN at 90 mL/hr at 1800 Electrolytes in TPN: Na 82mEq/L, K 70 mEq/L, Ca 7.55mEq/L, Mg 34mEq/L, and Phos 15 mmol/L. Cl:Ac 1:2 Add standard MVI and trace elements to  TPN Continue Sensitive q6h SSI and adjust as needed  IVF per MD (none) Monitor TPN labs on Mon/Thurs.  Ulice Dash D  04/22/2021 7:10 AM

## 2021-04-23 DIAGNOSIS — D649 Anemia, unspecified: Secondary | ICD-10-CM | POA: Diagnosis not present

## 2021-04-23 DIAGNOSIS — K5720 Diverticulitis of large intestine with perforation and abscess without bleeding: Secondary | ICD-10-CM | POA: Diagnosis not present

## 2021-04-23 DIAGNOSIS — I10 Essential (primary) hypertension: Secondary | ICD-10-CM | POA: Diagnosis not present

## 2021-04-23 LAB — COMPREHENSIVE METABOLIC PANEL
ALT: 10 U/L (ref 0–44)
AST: 15 U/L (ref 15–41)
Albumin: 2.2 g/dL — ABNORMAL LOW (ref 3.5–5.0)
Alkaline Phosphatase: 68 U/L (ref 38–126)
Anion gap: 6 (ref 5–15)
BUN: 27 mg/dL — ABNORMAL HIGH (ref 6–20)
CO2: 30 mmol/L (ref 22–32)
Calcium: 8.2 mg/dL — ABNORMAL LOW (ref 8.9–10.3)
Chloride: 100 mmol/L (ref 98–111)
Creatinine, Ser: <0.3 mg/dL — ABNORMAL LOW (ref 0.44–1.00)
Glucose, Bld: 100 mg/dL — ABNORMAL HIGH (ref 70–99)
Potassium: 4.7 mmol/L (ref 3.5–5.1)
Sodium: 136 mmol/L (ref 135–145)
Total Bilirubin: 0.5 mg/dL (ref 0.3–1.2)
Total Protein: 5.3 g/dL — ABNORMAL LOW (ref 6.5–8.1)

## 2021-04-23 LAB — CBC WITH DIFFERENTIAL/PLATELET
Abs Immature Granulocytes: 0.15 10*3/uL — ABNORMAL HIGH (ref 0.00–0.07)
Basophils Absolute: 0 10*3/uL (ref 0.0–0.1)
Basophils Relative: 0 %
Eosinophils Absolute: 0.1 10*3/uL (ref 0.0–0.5)
Eosinophils Relative: 1 %
HCT: 21.7 % — ABNORMAL LOW (ref 36.0–46.0)
Hemoglobin: 6.9 g/dL — CL (ref 12.0–15.0)
Immature Granulocytes: 1 %
Lymphocytes Relative: 12 %
Lymphs Abs: 1.4 10*3/uL (ref 0.7–4.0)
MCH: 29.1 pg (ref 26.0–34.0)
MCHC: 31.8 g/dL (ref 30.0–36.0)
MCV: 91.6 fL (ref 80.0–100.0)
Monocytes Absolute: 0.6 10*3/uL (ref 0.1–1.0)
Monocytes Relative: 6 %
Neutro Abs: 9.2 10*3/uL — ABNORMAL HIGH (ref 1.7–7.7)
Neutrophils Relative %: 80 %
Platelets: 433 10*3/uL — ABNORMAL HIGH (ref 150–400)
RBC: 2.37 MIL/uL — ABNORMAL LOW (ref 3.87–5.11)
RDW: 16.2 % — ABNORMAL HIGH (ref 11.5–15.5)
WBC: 11.5 10*3/uL — ABNORMAL HIGH (ref 4.0–10.5)
nRBC: 0.3 % — ABNORMAL HIGH (ref 0.0–0.2)

## 2021-04-23 LAB — MAGNESIUM: Magnesium: 2 mg/dL (ref 1.7–2.4)

## 2021-04-23 LAB — GLUCOSE, CAPILLARY
Glucose-Capillary: 96 mg/dL (ref 70–99)
Glucose-Capillary: 95 mg/dL (ref 70–99)
Glucose-Capillary: 116 mg/dL — ABNORMAL HIGH (ref 70–99)
Glucose-Capillary: 118 mg/dL — ABNORMAL HIGH (ref 70–99)

## 2021-04-23 LAB — PREPARE RBC (CROSSMATCH)

## 2021-04-23 LAB — CEA: CEA: 5.4 ng/mL — ABNORMAL HIGH (ref 0.0–4.7)

## 2021-04-23 LAB — PHOSPHORUS: Phosphorus: 3.8 mg/dL (ref 2.5–4.6)

## 2021-04-23 MED ORDER — TRAVASOL 10 % IV SOLN
INTRAVENOUS | Status: AC
  Filled 2021-04-23: qty 1188

## 2021-04-23 MED ORDER — SODIUM CHLORIDE 0.9% IV SOLUTION: Freq: Once | INTRAVENOUS | Status: AC

## 2021-04-23 MED ORDER — ENSURE ENLIVE PO LIQD
237.0000 mL | Freq: Two times a day (BID) | ORAL | Status: DC
  Administered 2021-04-23 – 2021-04-30 (×13): 237 mL via ORAL

## 2021-04-23 MED ORDER — FUROSEMIDE 10 MG/ML IJ SOLN
40.0000 mg | Freq: Two times a day (BID) | INTRAMUSCULAR | Status: DC
Start: 2021-04-23 — End: 2021-04-27
  Administered 2021-04-23 – 2021-04-27 (×8): 40 mg via INTRAVENOUS
  Filled 2021-04-23 (×8): qty 4

## 2021-04-23 MED ORDER — CHLORHEXIDINE GLUCONATE 0.12 % MT SOLN
15.0000 mL | Freq: Two times a day (BID) | OROMUCOSAL | Status: DC
  Administered 2021-04-23 – 2021-05-01 (×16): 15 mL via OROMUCOSAL
  Filled 2021-04-23 (×17): qty 15

## 2021-04-23 NOTE — Progress Notes (Signed)
Nutrition Follow-up  DOCUMENTATION CODES:   Obesity unspecified  INTERVENTION:  - will order Ensure Enlive BID, each supplement provides 350 kcal and 20 grams of protein. - RD will follow-up 11/18 for day #1 of Calorie Count.    NUTRITION DIAGNOSIS:   Inadequate oral intake related to acute illness, inability to eat as evidenced by per patient/family report, NPO status. -ongoing, improving with diet advancement  GOAL:   Patient will meet greater than or equal to 90% of their needs -met with TPN regimen and minimal PO intake.   MONITOR:   PO intake, Supplement acceptance, Labs, Weight trends, Other (Comment) (TPN regimen)  REASON FOR ASSESSMENT:   Consult Calorie Count  ASSESSMENT:   35 year old female with medical history of HTN, sinus tachycardia, obesity, anemia, OSA, and diverticulosis with complicated diverticulitis and abscess formation 11/2020 s/p IR guided placement of 2 left lower quadrant drains. She presented to the ED due to progressive generalized weakness x2 months and lightheadedness. She also reported poor oral intake PTA and gradual weight loss. She was seen by a Gastroenterologist on 10/6 with plan to undergo endoscopic work-up to rule out malignancy.  NGT removed on 11/15. Diet advanced to [place] on 11/15 at 0744, to [place] on 11/15 at 1548, and to Soft yesterday at [place]. No intakes documented since diet advancement began.   Patient laying in bed with husband at bedside. Discussed a Soft diet, foods not allowed, and rationale for the diet. Patient had a few bites of eggs with cheese and grits for breakfast and has not ordered a lunch tray d/t not feeling hungry and having mild cramping since breakfast. She likes Ensure supplements and is open to receiving them.   Informed patient of order in place for Calorie Count and rationale for this. RD also made RN aware and let RN know how to do Calorie Count.  She is receiving custom TPN at goal rate of 90 ml/hr which is  providing 2225 kcal and 119 grams protein.    She has not been weighed since 11/9. Non-pitting edema  to all extremities documented in the edema section of flow sheet.   Per notes, possible plan for CIR at time of d/c.    Labs reviewed; CBGs: 96 and 95 mg/dl, BUN: 27 mg/dl, creatinine: <0.3 mg/dl, Ca: 8.2 mg/dl.   Medications reviewed; 40 mg IV lasix BID, sliding scale novolog, 40 mg IV protonix/day, 45 mmol IV KPhos x1 run 11/16.   Diet Order:   Diet Order             DIET SOFT Room service appropriate? Yes; Fluid consistency: Thin  Diet effective now                   EDUCATION NEEDS:   Education needs have been addressed  Skin:  Skin Assessment: Skin Integrity Issues: Skin Integrity Issues:: Other (Comment), Incisions, Wound VAC Wound Vac: abdomen Incisions: abdomen (11/9) Other: open pressure sore vs MASD to L buttocks  Last BM:  11/16 (type 7)  Height:   Ht Readings from Last 1 Encounters:  04/15/21 5' 3"  (1.6 m)    Weight:   Wt Readings from Last 1 Encounters:  04/15/21 99.5 kg    Estimated Nutritional Needs:  Kcal:  2150-2400 kcal Protein:  110-125 grams Fluid:  >/= 2.2 L/day     Jarome Matin, MS, RD, LDN, CNSC Inpatient Clinical Dietitian RD pager # available in [place]  After hours/weekend pager # available in [facility]

## 2021-04-23 NOTE — Progress Notes (Signed)
PHARMACY - TOTAL PARENTERAL NUTRITION CONSULT NOTE   Indication:  intolerance to enteral feeding  Patient Measurements: Height: 5\' 3"  (160 cm) Weight: 99.5 kg (219 lb 5.7 oz) IBW/kg (Calculated) : 52.4 TPN AdjBW (KG): 62 Body mass index is 38.86 kg/m. Usual Weight: 90.7  Assessment: 35 yo F s/p laparoscopic assisted Hartmann (left colectomy / end colostomy), small bowel resection, left retroperitoneal / flank abscess drainage on 04/15/21.   Start TPN per pharmacy for nutrition support.  PMH:  hypertension, inappropriate sinus tachycardia, obesity, obstructive sleep apnea and diverticulosis with complicated diverticulitis and abscess formation 11/2020 status post IR guided placement of 2 left lower quadrant drains  Glucose / Insulin: no hx DM; A1c 4.6; no PTA medications. - CBGs 100-114 (goal 150-180) - SSI 0 unit / 24 hrs Electrolytes: WNL, CorrCa 9.6 - hx prolonged QTC, goal K =>4 and Mg =>2 Renal:  WNL Hepatic: LFTs WNL, 71 (11/15) Intake / Output; MIVF: UOP 1350 ml/24 hrs  - Drains down 65 mls; NG out; Stool 200 mL - no mIVF.  - Lasix 20 BID GI Imaging: GI Surgeries / Procedures:  11/9 Lap Hartmann (left colectomy / end colostomy), small bowel resection, left retroperitoneal / flank abscess drainage  Central access: PICC replaced 11/11 TPN start date: 04/16/21  Nutritional Goals:  Goal TPN rate 90 mL/hr provides 119 g of protein and 2225 kcals per day  RD Assessment: (11/11) Estimated Needs Total Energy Estimated Needs: 2150-2400 kcal Total Protein Estimated Needs: 110-125 grams Total Fluid Estimated Needs: >/= 2.2 L/day  Current Nutrition:  Transitioned to soft diet 11/16 Improved PO intake per surgery note but charting is missing  Plan:  Continue TPN at 90 mL/hr at 1800 Electrolytes in TPN: Na 7mEq/L, K 70 mEq/L, Ca 7.91mEq/L, Mg 2mEq/L, and Phos 15 mmol/L. Cl:Ac 1:1 Add standard MVI and trace elements to TPN Continue Sensitive q6h SSI and adjust as needed  IVF  per MD (none) Monitor TPN labs on Mon/Thurs. BMET, Mg, and Phos in AM  Ulice Dash D  04/23/2021 7:30 AM

## 2021-04-23 NOTE — Progress Notes (Signed)
I spoke with Mikayla Mikayla Rios this am via phone.  I explained the reason for my call. Mikayla Rios has agreed to a new patient appt with Dr Burr Medico on 12/5/20022 at 1500.  I advised her to arrive 15 minutes early for registration. I provided our phone number.  I let her know that if she is still at inpt rehab at [place] Dr Burr Medico will see her there.  I told her I would monitor her disposition.  All questions were answered.  She verbalized understanding.

## 2021-04-23 NOTE — Progress Notes (Signed)
PROGRESS NOTE    Mikayla Rios  MQK:[id number] DOB: [birth year] DOA: 04/10/2021 PCP: [name], MD   Brief Narrative:  The patient is a 35 year old obese Mikayla Rios female with a past medical history significant for [place] to hypertension, inappropriate sinus tachycardia, obesity, obstructive sleep apnea, history of diverticulosis with complicated diverticulitis, history of abscess formation on 622 22 status post IR guided placement to left lower quadrant drains who presented to [facility] long ED with complaints of generalized weakness and lightheadedness.  She is found to have any abscess measuring about 8.5 x 10 cm extending into the left lateral abdominal wall.  She underwent drain exchange and upsizing by IR on Monday and then subsequently underwent a colectomy and colostomy with small bowel resection and retroperitoneal abscess drainage on 04/15/2021.  Postoperatively she was monitored in the ICU the vent and briefly required pressors.  She is extubated 04/16/2021 and has since been weaned off of pressors.  She was transferred back to [facility] on 04/18/2021 CIR is evaluating her for placement.  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  Medical oncology has been consulted and they feel that she would be benefit and from post adjuvant chemotherapy and perhaps focal radiation therapy.  She is will be discussed at the tumor board.  There will be consideration of Port-A-Cath placement while she is here and general surgery feels that they are unlikely attempt colostomy takedown and anastomosis for least a year or so until there is no local recurrence or metastatic disease.  She continues to be tachycardic but states that her heart rate has always been over 100.  Initial blood count dropped she is typed and screened and transfused 1 unit of PRBCs.  Her diet has been advanced to soft diet and will obtain a calorie count to evaluate how much she is eating as she is also being continued on TPN for  the time being  Assessment & Plan:   Principal Problem:   Diverticulitis of large intestine with abscess Active Problems:   Class 3 severe obesity due to excess calories with serious comorbidity and body mass index (BMI) of 60.0 to 69.9 in adult [facility])   Obstructive sleep apnea   Anemia   Inappropriate sinus tachycardia   Essential hypertension   SIRS (systemic inflammatory response syndrome) (HCC)   Hypokalemia, inadequate intake   Protein-calorie malnutrition, severe (HCC)   Reactive thrombocytosis   Prolonged QT interval  Complicated diverticulitis with abscess s/p drain placement by IR twice in June 2022 with numerous exchanges since then due to persistent fistulous -CT of the abdomen and pelvis New lobulated enhancing fluid collection posterior to this segment of inflamed colon measuring 8.5 x 3.5 x 10.0 cm. This collection now invades the adjacent iliopsoas muscle as well as extends through the left lateral abdominal wall. -Patient has had prolonged treatment with antibiotics and multiple drain placements, but despite conservative measures, she has failed to improve and now has a new abscess -Seen by general surgery and she underwent colectomy/colostomy and drainage of retroperitoneal abscess -She is completed a course of IV antibiotics -Has been removed and diet is being advanced per general surgery -She has wound VAC over abdominal wound -Defer further postop care to surgery and they had a long discussion with the patient about her adenocarcinoma -Diet has been advanced to a soft diet and surgeries continue TPN for now -We will order a calorie count to evaluate, so patient is actually taking in   Septic shock -Secondary to retroperitoneal abscess -Patient  required vasopressor therapy postoperatively, but has been able to wean off pressors -Blood pressures now elevated -WBC has trended down and went from 13.4 -> 11.3 -> 11.6 -> 11.5 -She has completed antibiotics   Acute  respiratory failure with hypoxia requiring mechanical ventilation -Patient was intubated for operative management and due to hemodynamic instability postoperatively, decision was made to leave the patient intubated and monitored in the ICU -She was able to extubate on 11/10 and respiratory status has been stable   Adenocarcinoma colon -New diagnosis -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  Other care per home   Mechanical Fall:  -Patient reports dizziness and an episode of syncope and an episode of mechanical fall possibly from orthostatic hypotension.  -Monitor on telemetry. -CT angiogram of the chest ruled out PE -EKG shows prolonged QT interval 636.  Nonspecific T wave inversions.troponin is negative. Patient denies any chest pain or shortness of breath. -Probably secondary to orthostatic hypotension from infection. -Overall QTC has improved   Hypokalemia Hypophosphatemia -Currently being replaced with TPN protocol -Continue to monitor and replete as necessary   Prolonged QTc: -From hypokalemia and hypomagnesemia. -K is 4.7 today. magnesium is 2.0. -Keep k >4 and mag>2.  -Repeat EKG shows improvement in the qtC.     Anemia of chronic disease/ Acute anemia of acute illness -Normocytic.  -Baseline creatinine appears to be between 7-8 -Patient noted to have significant bleeding in ostomy -She was transfused a total of 7 units prbc thus far and will be receiving another unit today to make it 8 -Hgb/hematocrit dropped again and went from 7.3/22.9 and today was 6.9/20.7 -Continue to follow CBC -Transfuse to keep hemoglobin greater than 7.   Post op bleeding from ostomy -concern it may be bleeding at small bowel anastomosis -defer further intervention if needed to general  surgery -She received 2 units of FFP on 11/13 -Overall bleeding seems to be improving -continue to follow hemoglobin and supportive care with PRBC transfusions -See above   Anasarca -related to hypoalbuminemia and aggressive volume repletion as well as blood transfusions -Received albumin infusion and IV lasix 20 mg twice daily with good urine output -Swelling appears to be improving however upon further review she is +16.449 L since admission so we will increase Lasix to 40 mg twice daily   Hypoalbuminemia  -will get dietary consult.  -currently on TPN was n.p.o. but diet was advanced to soft.  We will obtain a calorie count   Sinus tachycardia  -Probably from anemia and infection and pain.  -Pt not symptomatic.  -Echocardiogram unremarkable -Continue on IV Lopressor and was transitioned to 12.5 mg p.o. twice daily and will go to 25 mg p.o. twice daily -She does have a history of inappropriate sinus tachycardia and has been worked up by cardiology in outpatient setting by Dr. Nechama Guard -Continue monitor heart rates clinically   Hypertension  Currently on IV lopressor and prn hydralazine -Restart on oral Lopressor once able and was resumed on 12.5 mg and will change to 25 mg twice daily -Continue to monitor blood pressures per protocol -Last blood pressure reading was 116/86   DVT prophylaxis: SCDs Code Status: Full code Family Communication: No family currently at bedside Disposition Plan: Pending further clinical improvement  Status is: Inpatient  Remains inpatient appropriate because: She is not yet back to her  baseline and will need to tolerate her diet and be off the TPN for safe discharge disposition as she will need to also go to CIR  Consultants:  PCCM transfer General surgery Medical oncology Cardiology Gastroenterology  Procedures:  Laparoscopic left colectomy/end colostomy, small bowel resection, left retroperitoneal/flank abscess drainage   ETT  11/09>11/10  LE Venous Duplex   Antimicrobials:  Anti-infectives (From admission, onward)    Start     Dose/Rate Route Frequency Ordered Stop   04/17/21 1700  ceFEPIme (MAXIPIME) 2 g in sodium chloride 0.9 % 100 mL IVPB  Status:  Discontinued        2 g 200 mL/hr over 30 Minutes Intravenous Every 8 hours 04/17/21 1530 04/21/21 0808   04/17/21 1700  metroNIDAZOLE (FLAGYL) IVPB 500 mg  Status:  Discontinued        500 mg 100 mL/hr over 60 Minutes Intravenous Every 12 hours 04/17/21 1530 04/21/21 0808   04/15/21 1000  anidulafungin (ERAXIS) 100 mg in sodium chloride 0.9 % 100 mL IVPB  Status:  Discontinued        100 mg 78 mL/hr over 100 Minutes Intravenous Every 24 hours 04/14/21 0744 04/21/21 0808   04/15/21 0900  clindamycin (CLEOCIN) IVPB 900 mg       See Hyperspace for full Linked Orders Report.   900 mg 100 mL/hr over 30 Minutes Intravenous 60 min pre-op 04/14/21 1405 04/15/21 1142   04/15/21 0900  gentamicin (GARAMYCIN) 340 mg in dextrose 5 % 100 mL IVPB       See Hyperspace for full Linked Orders Report.   5 mg/kg  67.7 kg (Adjusted) 108.5 mL/hr over 60 Minutes Intravenous 60 min pre-op 04/14/21 1405 04/15/21 1834   04/14/21 1000  anidulafungin (ERAXIS) 200 mg in sodium chloride 0.9 % 200 mL IVPB        200 mg 78 mL/hr over 200 Minutes Intravenous  Once 04/14/21 0755 04/14/21 1500   04/13/21 0900  meropenem (MERREM) 1 g in sodium chloride 0.9 % 100 mL IVPB  Status:  Discontinued        1 g 200 mL/hr over 30 Minutes Intravenous Every 8 hours 04/13/21 0800 04/17/21 1530   04/10/21 2200  cefTRIAXone (ROCEPHIN) 2 g in sodium chloride 0.9 % 100 mL IVPB  Status:  Discontinued        2 g 200 mL/hr over 30 Minutes Intravenous Daily at bedtime 04/10/21 2044 04/13/21 0746   04/10/21 2200  metroNIDAZOLE (FLAGYL) IVPB 500 mg  Status:  Discontinued        500 mg 100 mL/hr over 60 Minutes Intravenous Every 12 hours 04/10/21 2044 04/13/21 0745        Subjective: Seen and examined and  she states that she is feeling better today than she was yesterday.  Heart rate remains elevated.  She denies any pain currently.  No nausea or vomiting.  About to get another unit of PRBCs.  No other concerns or complaints at this time and states that she started eating last night.  Objective: Vitals:   04/23/21 1144 04/23/21 1159 04/23/21 1200 04/23/21 1415  BP: 109/81 105/72 99/73 116/86  Pulse: (!) 118 (!) 120 (!) 50 (!) 121  Resp: (!) 25 (!) 25 19 (!) 26  Temp: 98.3 F (36.8 C) 98.1 F (36.7 C)  98.9 F (37.2 C)  TempSrc: Oral Oral  Oral  SpO2: 100% 100% (!) 28% 100%  Weight:      Height:  Intake/Output Summary (Last 24 hours) at 04/23/2021 1454 Last data filed at 04/23/2021 1415 Gross per 24 hour  Intake 2733.9 ml  Output 1375 ml  Net 1358.9 ml    Filed Weights   04/10/21 1436 04/15/21 0808 04/15/21 1800  Weight: 90.7 kg 123.6 kg 99.5 kg   Examination: Physical Exam:  Constitutional: WN/WD morbidly obese African-American female currently in no acute distress appears calm today  Eyes: Lids and conjunctivae normal, sclerae anicteric  ENMT: External Ears, Nose appear normal. Grossly normal hearing. Mucous membranes are moist.  Neck: Appears normal, supple, no cervical masses, normal ROM, no appreciable thyromegaly; no appreciable JVD Respiratory: Diminished to auscultation bilaterally, no wheezing, rales, rhonchi or crackles. Normal respiratory effort and patient is not tachypenic. No accessory muscle use.  Unlabored breathing Cardiovascular: Tachycardic rate with rates in the 130s but sinus, no murmurs / rubs / gallops. S1 and S2 auscultated.  Has 1+ extremity edema Abdomen: Soft, non-tender, distended secondary body habitus.  Has a colostomy in place with air and stool in it.. Bowel sounds positive.  GU: Deferred. Musculoskeletal: No clubbing / cyanosis of digits/nails. No joint deformity upper and lower extremities.  Skin: No rashes, lesions, ulcers. No  induration; Warm and dry.  Neurologic: CN 2-12 grossly intact with no focal deficits. Romberg sign and cerebellar reflexes not assessed.  Psychiatric: Normal judgment and insight. Alert and oriented x 3. Normal mood and appropriate affect.   Data Reviewed: I have personally reviewed following labs and imaging studies  CBC: Recent Labs  Lab 04/20/21 0500 04/20/21 1847 04/21/21 0500 04/22/21 0418 04/23/21 0522  WBC 12.1* 13.4* 11.3* 11.6* 11.5*  NEUTROABS  --   --   --   --  9.2*  HGB 7.5* 7.7* 7.2* 7.3* 6.9*  HCT 21.9* 23.7* 21.8* 22.9* 21.7*  MCV 89.4 90.8 95.6 90.5 91.6  PLT 313 361 349 421* 433*    Basic Metabolic Panel: Recent Labs  Lab 04/20/21 0530 04/21/21 0500 04/21/21 0600 04/22/21 0418 04/23/21 0522  NA 138 131* 139 136 136  K 5.3* 2.9* 2.8* 3.3* 4.7  CL 105 99 107 102 100  CO2 26 26 26 29 30   GLUCOSE 432* 1,172* 94 107* 100*  BUN 20 22* 21* 25* 27*  CREATININE 0.36* 0.53 0.35* 0.31* <0.30*  CALCIUM 8.5* 8.5* 7.5* 8.4* 8.2*  MG 2.0 2.2 1.3* 1.9 2.0  PHOS 4.8* 2.5 4.1 1.4* 3.8    GFR: CrCl cannot be calculated (This lab value cannot be used to calculate CrCl because it is not a number: <0.30). Liver Function Tests: Recent Labs  Lab 04/17/21 0500 04/19/21 0214 04/20/21 0530 04/22/21 0418 04/23/21 0522  AST 13* 10* 10* 12* 15  ALT 9 9 9 9 10   ALKPHOS 51 47 43 63 68  BILITOT 0.5 0.3 0.5 0.4 0.5  PROT 4.2* 4.0* 4.9* 5.2* 5.3*  ALBUMIN 2.0* 1.5* 2.7* 2.4* 2.2*    No results for input(s): LIPASE, AMYLASE in the last 168 hours. No results for input(s): AMMONIA in the last 168 hours. Coagulation Profile: Recent Labs  Lab 04/19/21 1500  INR 1.2    Cardiac Enzymes: No results for input(s): CKTOTAL, CKMB, CKMBINDEX, TROPONINI in the last 168 hours. BNP (last 3 results) No results for input(s): PROBNP in the last 8760 hours. HbA1C: No results for input(s): HGBA1C in the last 72 hours. CBG: Recent Labs  Lab 04/22/21 1242 04/22/21 1728  04/22/21 2358 04/23/21 0752 04/23/21 1152  GLUCAP 114* 105* 108* 96 95  Lipid Profile: Recent Labs    04/21/21 0600  TRIG 71    Thyroid Function Tests: No results for input(s): TSH, T4TOTAL, FREET4, T3FREE, THYROIDAB in the last 72 hours. Anemia Panel: Recent Labs    04/21/21 1204  VITAMINB12 374    Sepsis Labs: Recent Labs  Lab 04/20/21 0052 04/20/21 0335  LATICACIDVEN 1.1 1.0     Recent Results (from the past 240 hour(s))  Surgical PCR screen     Status: None   Collection Time: 04/14/21  4:34 PM   Specimen: Nasal Mucosa; Nasal Swab  Result Value Ref Range Status   MRSA, PCR NEGATIVE NEGATIVE Final   Staphylococcus aureus NEGATIVE NEGATIVE Final    Comment: (NOTE) The Xpert SA Assay (FDA approved for NASAL specimens in patients 23 years of age and older), is one component of a comprehensive surveillance program. It is not intended to diagnose infection nor to guide or monitor treatment. Performed at [facility], [place] [address] [place], [place] [address]      RN Pressure Injury Documentation:     Estimated body mass index is 38.86 kg/m as calculated from the following:   Height as of this encounter: 5\' 3"  (1.6 m).   Weight as of this encounter: 99.5 kg.  Malnutrition Type:  Nutrition Problem: Inadequate oral intake Etiology: acute illness, inability to eat  Malnutrition Characteristics:  Signs/Symptoms: per patient/family report, NPO status  Nutrition Interventions:  Interventions: TPN   Radiology Studies: No results found.  Scheduled Meds:  acetaminophen  1,000 mg Oral Q6H   chlorhexidine  15 mL Mouth/Throat BID   Chlorhexidine Gluconate Cloth  6 each Topical Daily   diclofenac Sodium  4 g Topical QID   furosemide  20 mg Intravenous BID   insulin aspart  0-9 Units Subcutaneous Q6H   lip balm  1 application Topical BID   metoprolol tartrate  25 mg Oral BID   pantoprazole (PROTONIX) IV  40 mg Intravenous Daily    Continuous Infusions:  sodium chloride Stopped (04/19/21 1232)   methocarbamol (ROBAXIN) IV     TPN ADULT (ION) 90 mL/hr at 04/23/21 0601   TPN ADULT (ION)      LOS: 13 days   Kerney Elbe, DO [facility] PAGER is on [place]  If 7PM-7AM, please contact night-coverage [email or web address]

## 2021-04-23 NOTE — Progress Notes (Signed)
Assessment & Plan: POD#8 - S/P LAPAROSCOPIC ASSISTED HARTMANN  (LEFT COLECTOMY / END COLOSTOMY), SMALL BOWEL RESECTION, LEFT RETROPERITONEAL / FLANK ABSCESS DRAINAGE 04/15/21 Dr. [name]  Improving po intake - soft diet started  TNA - continue for now, but may wean if po intake increases  Wound care - VAC changed by [facility] nurse yesterday Colonic adenocarcinoma  Oncology aware and will evaluate Anemia  Hgb 6.9 this AM  May need tranfusion - per medical team Disposition  OT/PT working with patient  Likely to CIR when medically stable        [name], MD       [facility]       Office: [phone]   Chief Complaint: Perforated colonic adenocarcinoma  Subjective: Patient in bed in stepdown unit, on phone with mother.  No complaints.  Tolerating soft diet.  Objective: Vital signs in last 24 hours: Temp:  [98 F (36.7 C)-98.7 F (37.1 C)] 98.7 F (37.1 C) (11/17 0806) Pulse Rate:  [110-140] 122 (11/17 0600) Resp:  [19-27] 23 (11/17 0600) BP: (98-168)/(62-91) 114/82 (11/17 0600) SpO2:  [96 %-100 %] 100 % (11/17 0600) Last BM Date: 04/22/21  Intake/Output from previous day: 11/16 0701 - 11/17 0700 In: 2708.2 [P.O.:60; I.V.:2082.1; IV Piggyback:566.1] Out: 1615 [Urine:1350; Drains:65; Stool:200] Intake/Output this shift: No intake/output data recorded.  Physical Exam: HEENT - sclerae clear, mucous membranes moist Neck - soft Chest - clear bilaterally Cor - RRR Abdomen - soft; VAC intact; liquid stool in ostomy bag, no blood; JP with thin serosanguinous Neuro - alert & oriented, no focal deficits  Lab Results:  Recent Labs    04/22/21 0418 04/23/21 0522  WBC 11.6* 11.5*  HGB 7.3* 6.9*  HCT 22.9* 21.7*  PLT 421* 433*   BMET Recent Labs    04/22/21 0418 04/23/21 0522  NA 136 136  K 3.3* 4.7  CL 102 100  CO2 29 30  GLUCOSE 107* 100*  BUN 25* 27*  CREATININE 0.31* <0.30*  CALCIUM 8.4* 8.2*   PT/INR No results for input(s):  LABPROT, INR in the last 72 hours. Comprehensive Metabolic Panel:    Component Value Date/Time   NA 136 04/23/2021 0522   NA 136 04/22/2021 0418   NA 139 12/21/2019 1024   NA 137 10/02/2019 1127   K 4.7 04/23/2021 0522   K 3.3 (L) 04/22/2021 0418   CL 100 04/23/2021 0522   CL 102 04/22/2021 0418   CO2 30 04/23/2021 0522   CO2 29 04/22/2021 0418   BUN 27 (H) 04/23/2021 0522   BUN 25 (H) 04/22/2021 0418   BUN 13 12/21/2019 1024   BUN 11 10/02/2019 1127   CREATININE <0.30 (L) 04/23/2021 0522   CREATININE 0.31 (L) 04/22/2021 0418   GLUCOSE 100 (H) 04/23/2021 0522   GLUCOSE 107 (H) 04/22/2021 0418   CALCIUM 8.2 (L) 04/23/2021 0522   CALCIUM 8.4 (L) 04/22/2021 0418   AST 15 04/23/2021 0522   AST 12 (L) 04/22/2021 0418   ALT 10 04/23/2021 0522   ALT 9 04/22/2021 0418   ALKPHOS 68 04/23/2021 0522   ALKPHOS 63 04/22/2021 0418   BILITOT 0.5 04/23/2021 0522   BILITOT 0.4 04/22/2021 0418   BILITOT 0.2 07/11/2019 1638   BILITOT 0.2 07/25/2018 1150   PROT 5.3 (L) 04/23/2021 0522   PROT 5.2 (L) 04/22/2021 0418   PROT 7.4 07/11/2019 1638   PROT 7.3 07/25/2018 1150   ALBUMIN 2.2 (L) 04/23/2021 0522   ALBUMIN 2.4 (  L) 04/22/2021 0418   ALBUMIN 4.6 07/11/2019 1638   ALBUMIN 4.3 07/25/2018 1150    Studies/Results: No results found.    [name] 04/23/2021   Patient ID: Mikayla Rios, female   DOB: [birth year], 35 y.o.   MRN: [id number]

## 2021-04-24 ENCOUNTER — Encounter (HOSPITAL_COMMUNITY): Payer: Self-pay | Admitting: Internal Medicine

## 2021-04-24 DIAGNOSIS — K5720 Diverticulitis of large intestine with perforation and abscess without bleeding: Secondary | ICD-10-CM | POA: Diagnosis not present

## 2021-04-24 DIAGNOSIS — I10 Essential (primary) hypertension: Secondary | ICD-10-CM | POA: Diagnosis not present

## 2021-04-24 DIAGNOSIS — C187 Malignant neoplasm of sigmoid colon: Secondary | ICD-10-CM | POA: Diagnosis not present

## 2021-04-24 DIAGNOSIS — D649 Anemia, unspecified: Secondary | ICD-10-CM | POA: Diagnosis not present

## 2021-04-24 LAB — CBC WITH DIFFERENTIAL/PLATELET
Abs Immature Granulocytes: 0.36 10*3/uL — ABNORMAL HIGH (ref 0.00–0.07)
Basophils Absolute: 0 10*3/uL (ref 0.0–0.1)
Basophils Relative: 0 %
Eosinophils Absolute: 0.1 10*3/uL (ref 0.0–0.5)
Eosinophils Relative: 1 %
HCT: 23.7 % — ABNORMAL LOW (ref 36.0–46.0)
Hemoglobin: 7.5 g/dL — ABNORMAL LOW (ref 12.0–15.0)
Immature Granulocytes: 3 %
Lymphocytes Relative: 15 %
Lymphs Abs: 1.9 10*3/uL (ref 0.7–4.0)
MCH: 28.8 pg (ref 26.0–34.0)
MCHC: 31.6 g/dL (ref 30.0–36.0)
MCV: 91.2 fL (ref 80.0–100.0)
Monocytes Absolute: 0.9 10*3/uL (ref 0.1–1.0)
Monocytes Relative: 7 %
Neutro Abs: 9.4 10*3/uL — ABNORMAL HIGH (ref 1.7–7.7)
Neutrophils Relative %: 74 %
Platelets: 538 10*3/uL — ABNORMAL HIGH (ref 150–400)
RBC: 2.6 MIL/uL — ABNORMAL LOW (ref 3.87–5.11)
RDW: 16.1 % — ABNORMAL HIGH (ref 11.5–15.5)
WBC: 12.7 10*3/uL — ABNORMAL HIGH (ref 4.0–10.5)
nRBC: 0.2 % (ref 0.0–0.2)

## 2021-04-24 LAB — COMPREHENSIVE METABOLIC PANEL
ALT: 10 U/L (ref 0–44)
AST: 11 U/L — ABNORMAL LOW (ref 15–41)
Albumin: 2.2 g/dL — ABNORMAL LOW (ref 3.5–5.0)
Alkaline Phosphatase: 71 U/L (ref 38–126)
Anion gap: 5 (ref 5–15)
BUN: 22 mg/dL — ABNORMAL HIGH (ref 6–20)
CO2: 31 mmol/L (ref 22–32)
Calcium: 8.3 mg/dL — ABNORMAL LOW (ref 8.9–10.3)
Chloride: 99 mmol/L (ref 98–111)
Creatinine, Ser: <0.3 mg/dL — ABNORMAL LOW (ref 0.44–1.00)
Glucose, Bld: 92 mg/dL (ref 70–99)
Potassium: 4.9 mmol/L (ref 3.5–5.1)
Sodium: 135 mmol/L (ref 135–145)
Total Bilirubin: 0.5 mg/dL (ref 0.3–1.2)
Total Protein: 5.5 g/dL — ABNORMAL LOW (ref 6.5–8.1)

## 2021-04-24 LAB — PHOSPHORUS: Phosphorus: 4.6 mg/dL (ref 2.5–4.6)

## 2021-04-24 LAB — GLUCOSE, CAPILLARY
Glucose-Capillary: 106 mg/dL — ABNORMAL HIGH (ref 70–99)
Glucose-Capillary: 106 mg/dL — ABNORMAL HIGH (ref 70–99)
Glucose-Capillary: 118 mg/dL — ABNORMAL HIGH (ref 70–99)
Glucose-Capillary: 101 mg/dL — ABNORMAL HIGH (ref 70–99)

## 2021-04-24 LAB — MAGNESIUM: Magnesium: 1.9 mg/dL (ref 1.7–2.4)

## 2021-04-24 LAB — SURGICAL PATHOLOGY

## 2021-04-24 MED ORDER — TRAVASOL 10 % IV SOLN
INTRAVENOUS | Status: DC
  Filled 2021-04-24: qty 1188

## 2021-04-24 MED ORDER — MAGNESIUM SULFATE 2 GM/50ML IV SOLN
2.0000 g | Freq: Once | INTRAVENOUS | Status: AC
  Administered 2021-04-24: 2 g via INTRAVENOUS
  Filled 2021-04-24: qty 50

## 2021-04-24 NOTE — Progress Notes (Signed)
Inpatient Rehab Admissions Coordinator:   Pt. Does not appear medically ready for CIR at this time. I will continue to follow for potential admit once she's ready.   Clemens Catholic, [place], [place] Admissions Coordinator  [phone] ([place]) [phone] (office)

## 2021-04-24 NOTE — Progress Notes (Signed)
Physical Therapy Treatment Patient Details Name: Mikayla Rios MRN: [id number] DOB: [birth year] Today's Date: 04/24/2021   History of Present Illness Mikayla Rios is a 35 yo female presents with complaints of generalized weakness and lightheadedness. PMH: HTN, sinus tachny, obesity, obstructive sleep apnea and diverticulosis with complicated diverticulitis and abscess formation 11/2020 status post IR guided placement of 2 left lower quadrant drains.  S/PP LAPAROSCOPIC ASSISTED HARTMANN  (LEFT COLECTOMY / END COLOSTOMY), SMALL BOWEL RESECTION, LEFT RETROPERITONEAL / FLANK ABSCESS DRAINAGE 04/15/21    PT Comments    Pt tolerated increased activity level today, she ambulated 12' with RW, HR 180 max with walking, HR 141 at rest. Instructed pt in BUE/LE strengthening exercises to be done independently.   Recommendations for follow up therapy are one component of a multi-disciplinary discharge planning process, led by the attending physician.  Recommendations may be updated based on patient status, additional functional criteria and insurance authorization.  Follow Up Recommendations  Acute inpatient rehab (3hours/day)     Assistance Recommended at Discharge Frequent or constant Supervision/Assistance  Equipment Recommendations  Rolling walker (2 wheels)    Recommendations for Other Services       Precautions / Restrictions Precautions Precautions: Fall Precaution Comments: monitor HR, colostomy, R drain, VAC Restrictions Weight Bearing Restrictions: No     Mobility  Bed Mobility Overal bed mobility: Needs Assistance       Supine to sit: HOB elevated;+2 for safety/equipment;+2 for physical assistance;Mod assist     General bed mobility comments: assistance to move LLE and trunk negotiation to transfer into sitting. Needed increased time and use of bed rail as well.    Transfers Overall transfer level: Needs assistance Equipment used: Rolling walker (2 wheels) Transfers: Sit  to/from Stand Sit to Stand: From elevated surface;Min assist;+2 physical assistance           General transfer comment: VCs hand placement, +2 for power up, safety and line management    Ambulation/Gait Ambulation/Gait assistance: Min guard;+2 safety/equipment Gait Distance (Feet): 12 Feet Assistive device: Rolling walker (2 wheels) Gait Pattern/deviations: Step-through pattern;Decreased stride length       General Gait Details: HR 180 with walking, 141 at rest   Stairs             Wheelchair Mobility    Modified Rankin (Stroke Patients Only)       Balance Overall balance assessment: Needs assistance Sitting-balance support: No upper extremity supported Sitting balance-Leahy Scale: Fair Sitting balance - Comments: seated EOB   Standing balance support: During functional activity Standing balance-Leahy Scale: Poor Standing balance comment: reliant on walker                            Cognition Arousal/Alertness: Awake/alert Behavior During Therapy: WFL for tasks assessed/performed Overall Cognitive Status: Within Functional Limits for tasks assessed                                          Exercises General Exercises - Lower Extremity Ankle Circles/Pumps: AROM;Both;10 reps Quad Sets: AROM;Both;5 reps;Supine Gluteal Sets: AROM;Both;5 reps;Supine Long Arc Quad: AAROM;Right;10 reps Shoulder Exercises Pendulum Exercise: AROM;Both;10 reps;Supine    General Comments        Pertinent Vitals/Pain Pain Score: 5  Pain Location: R buttock Pain Descriptors / Indicators: Grimacing;Guarding Pain Intervention(s): Limited activity within patient's tolerance;Monitored during session  Home Living                          Prior Function            PT Goals (current goals can now be found in the care plan section) Acute Rehab PT Goals Patient Stated Goal: regain independence PT Goal Formulation: With  patient/family Time For Goal Achievement: 05/01/21 Potential to Achieve Goals: Good Progress towards PT goals: Progressing toward goals    Frequency    Min 3X/week      PT Plan Current plan remains appropriate    Co-evaluation PT/OT/SLP Co-Evaluation/Treatment: Yes            AM-PAC PT "6 Clicks" Mobility   Outcome Measure  Help needed turning from your back to your side while in a flat bed without using bedrails?: A Lot Help needed moving from lying on your back to sitting on the side of a flat bed without using bedrails?: Total Help needed moving to and from a bed to a chair (including a wheelchair)?: A Lot Help needed standing up from a chair using your arms (e.g., wheelchair or bedside chair)?: A Lot Help needed to walk in hospital room?: A Little Help needed climbing 3-5 steps with a railing? : Total 6 Click Score: 11    End of Session Equipment Utilized During Treatment: Gait belt Activity Tolerance: Patient tolerated treatment well Patient left: in chair;with call bell/phone within reach;with family/visitor present Nurse Communication: Mobility status PT Visit Diagnosis: Unsteadiness on feet (R26.81);Other abnormalities of gait and mobility (R26.89);Muscle weakness (generalized) (M62.81);Pain;Difficulty in walking, not elsewhere classified (R26.2)     Time: 1610-9604 PT Time Calculation (min) (ACUTE ONLY): 37 min  Charges:  $Gait Training: 8-22 mins $Therapeutic Exercise: 8-22 mins                    Blondell Reveal Kistler PT 04/24/2021  Acute Rehabilitation Services Pager [phone] Office [phone]

## 2021-04-24 NOTE — Consult Note (Addendum)
[facility] Nurse Consult follow-up Note: Reason for Consult: Pt is followed by the surgical team for post-op plan of care; PA at the bedside to assess wound appearance.  Vac dressing changed to midline full thickness post-op abd wound.  Pt medicated for pain prior to the procedure and tolerated with mod amt discomfort.  Wound bed: beefy red; refer to previous notes for wound measurements; wound has greatly decreased in depth since application. Drainage (amount, consistency, odor) minimal amt pink drainage Periwound: intact skin surrounding Dressing procedure/placement/frequency: Applied one piece black foam to 174mm cont suction. Dressing change will be due again on Mon.   WOC Nurse ostomy follow up Pt had colostomy surgery performed on 11/9 Stoma type/location: Stoma is red and viable, above skin level, 2 inches.  Peristomal assessment: mod amt brown liquid stool, some loose skin sloughing off top of stoma. Ostomy pouching: 2pc.  Education provided: Demonstrated pouch change using barrier ring to attempt to maintain a seal and 2 piece pouching system.Husband at bedside for teaching session and he was able to assist in the process and open and close the velcro.   Educational materials and extra setsof pouching supplies left at bedside. Use supplies: barrier ring, [place] # [phone], wafer [employer] Cables, pouch Lawson # [phone]. Enrolled patient in [place] Start Discharge program: Yes, previously [place] team will continue to follow for further teaching sessions. Thank-you,  Julien Girt MSN, [place], [place], [place], [place]

## 2021-04-24 NOTE — Progress Notes (Signed)
Assessment & Plan: POD#9 - S/P LAPAROSCOPIC ASSISTED HARTMANN  (LEFT COLECTOMY / END COLOSTOMY), SMALL BOWEL RESECTION, LEFT RETROPERITONEAL / FLANK ABSCESS DRAINAGE 04/15/21 Dr. 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       Office: [phone]   Chief Complaint: Perforated adenocarcinoma of colon  Subjective: Patient in bed, no complaints, husband at bedside.  Objective: Vital signs in last 24 hours: Temp:  [98.1 F (36.7 C)-98.9 F (37.2 C)] 98.2 F (36.8 C) (11/18 0700) Pulse Rate:  [50-130] 110 (11/18 0700) Resp:  [17-32] 20 (11/18 0700) BP: (88-119)/(47-90) 112/78 (11/18 0700) SpO2:  [28 %-100 %] 99 % (11/18 0700) Weight:  [100.5 kg-102.4 kg] 102.4 kg (11/18 0700) Last BM Date: 04/23/21  Intake/Output from previous day: 11/17 0701 - 11/18 0700 In: 2528.6 [P.O.:118; I.V.:2108.6; Blood:302] Out: 2410 [Urine:2250; Drains:15; Stool:145] Intake/Output this shift: Total I/O In: 180 [I.V.:180] Out: 250 [Urine:200; Stool:50]  Physical Exam: HEENT - sclerae clear, mucous membranes moist Neck - soft Abdomen - soft; stoma viable, new bag in place; VAC intact; small serosanguinous from right side JP drain Neuro - alert & oriented, no focal deficits  Lab Results:  Recent Labs    04/23/21 0522 04/24/21 0500  WBC 11.5* 12.7*  HGB 6.9* 7.5*  HCT 21.7* 23.7*  PLT 433* 538*   BMET Recent Labs    04/23/21 0522 04/24/21 0500  NA 136 135  K 4.7 4.9  CL 100 99  CO2 30 31  GLUCOSE 100* 92  BUN 27* 22*  CREATININE <0.30* <0.30*  CALCIUM  8.2* 8.3*   PT/INR No results for input(s): LABPROT, INR in the last 72 hours. Comprehensive Metabolic Panel:    Component Value Date/Time   NA 135 04/24/2021 0500   NA 136 04/23/2021 0522   NA 139 12/21/2019 1024   NA 137 10/02/2019 1127   K 4.9 04/24/2021 0500   K 4.7 04/23/2021 0522   CL 99 04/24/2021 0500   CL 100 04/23/2021 0522   CO2 31 04/24/2021 0500   CO2 30 04/23/2021 0522   BUN 22 (H) 04/24/2021 0500   BUN 27 (H) 04/23/2021 0522   BUN 13 12/21/2019 1024   BUN 11 10/02/2019 1127   CREATININE <0.30 (L) 04/24/2021 0500   CREATININE <0.30 (L) 04/23/2021 0522   GLUCOSE 92 04/24/2021 0500   GLUCOSE 100 (H) 04/23/2021 0522   CALCIUM 8.3 (L) 04/24/2021 0500   CALCIUM 8.2 (L) 04/23/2021 0522   AST 11 (L) 04/24/2021 0500   AST 15 04/23/2021 0522   ALT 10 04/24/2021 0500   ALT 10 04/23/2021 0522   ALKPHOS 71 04/24/2021 0500   ALKPHOS 68 04/23/2021 0522  BILITOT 0.5 04/24/2021 0500   BILITOT 0.5 04/23/2021 0522   BILITOT 0.2 07/11/2019 1638   BILITOT 0.2 07/25/2018 1150   PROT 5.5 (L) 04/24/2021 0500   PROT 5.3 (L) 04/23/2021 0522   PROT 7.4 07/11/2019 1638   PROT 7.3 07/25/2018 1150   ALBUMIN 2.2 (L) 04/24/2021 0500   ALBUMIN 2.2 (L) 04/23/2021 0522   ALBUMIN 4.6 07/11/2019 1638   ALBUMIN 4.3 07/25/2018 1150    Studies/Results: No results found.    [name] 04/24/2021   Patient ID: Mikayla Rios, female   DOB: June 20, 1985, 35 y.o.   MRN: [id number]

## 2021-04-24 NOTE — Progress Notes (Signed)
PHARMACY - TOTAL PARENTERAL NUTRITION CONSULT NOTE   Indication:  intolerance to enteral feeding  Patient Measurements: Height: 5\' 3"  (160 cm) Weight: 102.4 kg (225 lb 12 oz) IBW/kg (Calculated) : 52.4 TPN AdjBW (KG): 62 Body mass index is 39.99 kg/m. Usual Weight: 90.7  Assessment: 35 yo F s/p laparoscopic assisted Hartmann (left colectomy / end colostomy), small bowel resection, left retroperitoneal / flank abscess drainage on 04/15/21.   Start TPN per pharmacy for nutrition support.  PMH:  hypertension, inappropriate sinus tachycardia, obesity, obstructive sleep apnea and diverticulosis with complicated diverticulitis and abscess formation 11/2020 status post IR guided placement of 2 left lower quadrant drains  11/17 begin Calorie Count Glucose / Insulin: no hx DM; A1c 4.6; no PTA medications. - CBGs 100-116 (goal 150-180) - SSI 0 unit / 24 hrs Electrolytes: WNL but magnesium down to 1.9, CorrCa 9.74 - hx prolonged QTC, goal K =>4 and Mg =>2 Renal:  WNL Hepatic: LFTs WNL, 71 (11/15) Intake / Output; MIVF: UOP 1350 ml/24 hrs  - Drains down 65 mls; NG out; Stool 200 mL - no mIVF.  - Lasix 20 BID GI Imaging: GI Surgeries / Procedures:  11/9 Lap Hartmann (left colectomy / end colostomy), small bowel resection, left retroperitoneal / flank abscess drainage  Central access: PICC replaced 11/11 TPN start date: 04/16/21  Nutritional Goals:  Goal TPN rate 90 mL/hr provides 119 g of protein and 2225 kcals per day  RD Assessment: (11/11) Estimated Needs Total Energy Estimated Needs: 2150-2400 kcal Total Protein Estimated Needs: 110-125 grams Total Fluid Estimated Needs: >/= 2.2 L/day  Current Nutrition:  Transitioned to soft diet 11/16 Improved PO intake per surgery note but charting is missing Ensure Enlive BID  Plan:  Magnesium 2 g IV x 1 this morning Continue TPN at 90 mL/hr at 1800 Electrolytes in TPN: Na 73mEq/L, K 70 mEq/L, Ca 7.44mEq/L, Mg 5mEq/L, and Phos 15 mmol/L.  Cl:Ac 1:1 Add standard MVI and trace elements to TPN Continue Sensitive q6h SSI and adjust as needed  IVF per MD (none) Monitor TPN labs on Mon/Thurs. BMET, Mg, and Phos in AM  Royetta Asal, PharmD, [place] Please utilize Amion for appropriate phone number to reach the unit pharmacist ([place]) 04/24/2021 9:33 AM

## 2021-04-24 NOTE — Consult Note (Addendum)
[place]  [phone] [phone]   MEDICAL ONCOLOGY - INITIAL CONSULTATION  Referral MD: Dr. 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  She presented to the emergency department with generalized weakness and lightheadedness.  On admission, she had a CTA chest and CT abdomen/pelvis which showed findings compatible with ascending colon diverticulitis with perforation and the free air and inflammation had increased since prior scan, new lobulated enhancing fluid collection posterior to this segment of inflamed colon measuring 8.5 x 3.5 x 10 cm and this collection now invades the adjacent iliopsoas muscle as well as extends through the left lateral abdominal wall and findings were consistent with abscess.  General surgery was notified of admission and initially started her on antibiotics.  She was taken to the OR by Dr. Johney Maine on 04/15/2021 for a laparoscopic-assisted [facility] [place] (left colectomy/end colostomy), small bowel resection, and left retroperitoneal/flank abscess drainage.  Surgical pathology showed adenocarcinoma in the small bowel resection and no carcinoma was identified in 1 lymph node.  In the perforated left colon resection there was moderately differentiated colonic adenocarcinoma and the tumor extended into the pericolonic adipose tissue and is strongly suggestive of invasion into the small bowel.  0/4 lymph nodes positive for malignancy.  She was staged as pT4, pN0.  Mismatch repair protein was abnormal.  I met with the patient and her husband in her hospital room.  She reports that she slowly started to feel better.  She is having  less abdominal pain.  She is not having any nausea or vomiting at this time.  She reports ostomy is functioning.  She remains on TPN and she is starting to take in more by mouth. Prior to admission, she reports that her appetite has been decreased.  She states that she would be hungry and try to eat but she would have increased abdominal pain.  She reports that she has lost a fair amount of weight over the past year.  Initially, she was trying to lose weight with a keto diet so some of her weight loss was intentional.  She reports that she was also having a significant amount of diarrhea prior to admission.  The patient is married and has 3 children.  Denies history of alcohol and tobacco use.  Family history significant for a mother with a history of breast cancer now being treated for colon cancer and a sister who had multiple colon polyps and unclear if she has had malignancy or not.  Medical oncology was asked see the patient to make recommendations regarding her newly diagnosed colon adenocarcinoma.  Past Medical History:  Diagnosis Date   Allergy    seasonal   Anemia    Blood infection 1985   Blood transfusion without reported diagnosis    Diverticulitis    Hypertension    Obesity    Sleep apnea   :   Past Surgical History:  Procedure Laterality Date   COLECTOMY WITH COLOSTOMY CREATION/HARTMANN PROCEDURE N/A 04/15/2021   Procedure: COLOSTOMY CREATION/HARTMANN PROCEDURE;  Surgeon: [name], MD;  Location: [facility];  Service: General;  Laterality: N/A;   IR CATHETER TUBE CHANGE  12/12/2020   IR CATHETER TUBE CHANGE  01/27/2021   IR CATHETER TUBE CHANGE  02/19/2021   IR CATHETER TUBE CHANGE  04/13/2021   IR RADIOLOGIST EVAL & MGMT  11/27/2020   IR RADIOLOGIST EVAL & MGMT  12/11/2020   IR RADIOLOGIST EVAL & MGMT  01/07/2021   IR RADIOLOGIST EVAL & MGMT  02/04/2021   IR SINUS/FIST TUBE CHK-NON GI  12/12/2020   IR SINUS/FIST TUBE CHK-NON GI  02/19/2021   LAPAROSCOPIC PARTIAL COLECTOMY  N/A 04/15/2021   Procedure: LAPAROSCOPIC ASSISTED HARTMANN RESECTION;  Surgeon: [name], MD;  Location: [facility];  Service: General;  Laterality: N/A;  :   Current Facility-Administered Medications  Medication Dose Route Frequency Provider Last Rate Last Admin   0.9 %  sodium chloride infusion   Intravenous PRN Kathie Dike, MD   Paused at 04/19/21 1232   acetaminophen (TYLENOL) tablet 1,000 mg  1,000 mg Oral Q6H Simaan, Darci Current, PA-C   1,000 mg at 04/24/21 0544   chlorhexidine (PERIDEX) 0.12 % solution 15 mL  15 mL Mouth/Throat BID Sheikh, Omair Latif, DO   15 mL at 04/23/21 2200   Chlorhexidine Gluconate Cloth 2 % PADS 6 each  6 each Topical Daily Jill Alexanders, PA-C   6 each at 04/23/21 0910   diclofenac Sodium (VOLTAREN) 1 % topical gel 4 g  4 g Topical QID Kathie Dike, MD   4 g at 04/23/21 2211   feeding supplement (ENSURE ENLIVE / ENSURE PLUS) liquid 237 mL  237 mL Oral BID BM Sheikh, Omair [place], DO   237 mL at 04/23/21 2004   furosemide (LASIX) injection 40 mg  40 mg Intravenous BID Sheikh, Omair Latif, DO   40 mg at 04/24/21 0746   hydrALAZINE (APRESOLINE) injection 10 mg  10 mg Intravenous Q6H PRN Kathie Dike, MD   10 mg at 04/22/21 0602   HYDROmorphone (DILAUDID) injection 0.5-2 mg  0.5-2 mg Intravenous Q1H PRN [name], MD       insulin aspart (novoLOG) injection 0-9 Units  0-9 Units Subcutaneous Q6H Leodis Sias T, RPH   1 Units at 04/22/21 0040   labetalol (NORMODYNE) injection 10-20 mg  10-20 mg Intravenous Q2H PRN Frederik Pear, MD   20 mg at 04/19/21 0834   lip balm (CARMEX) ointment 1 application  1 application Topical BID Jill Alexanders, PA-C   1 application at 15/05/69 2211   LORazepam (ATIVAN) tablet 0.5-1 mg  0.5-1 mg Oral Q8H PRN [name], MD       magic mouthwash  15 mL Oral QID PRN Jill Alexanders, PA-C       magnesium sulfate IVPB 2 g 50 mL  2 g Intravenous Once Suzzanne Cloud, RPH       methocarbamol (ROBAXIN) 1,000 mg  in dextrose 5 % 100 mL IVPB  1,000 mg Intravenous Q6H PRN [name], MD       methocarbamol (ROBAXIN) tablet 1,000 mg  1,000 mg Oral Q6H PRN [name], MD       metoprolol tartrate (LOPRESSOR) tablet 25 mg  25 mg Oral BID Sheikh, Omair Latif, DO   25 mg at 04/23/21 2200   oxyCODONE (Oxy IR/ROXICODONE) immediate release tablet 5-10 mg  5-10 mg Oral Q4H PRN Jill Alexanders, PA-C   10 mg at 04/24/21 0746   pantoprazole (PROTONIX) injection 40 mg  40 mg Intravenous Daily Jill Alexanders, PA-C   40 mg at 04/23/21 0910   phenol (CHLORASEPTIC) mouth spray 2 spray  2 spray Mouth/Throat PRN Jill Alexanders, PA-C   2 spray at  04/19/21 1115   prochlorperazine (COMPAZINE) injection 5-10 mg  5-10 mg Intravenous Q4H PRN Jill Alexanders, PA-C       sodium chloride flush (NS) 0.9 % injection 10-40 mL  10-40 mL Intracatheter PRN Jill Alexanders, PA-C   10 mL at 04/21/21 1744   TPN ADULT (ION)   Intravenous Continuous TPN Raiford Noble Latif, DO 90 mL/hr at 04/24/21 0800 Infusion Verify at 04/24/21 0800   TPN ADULT (ION)   Intravenous Continuous TPN Suzzanne Cloud, [facility]          Allergies  Allergen Reactions   Shellfish Allergy Hives   Penicillins Hives and Rash    Has patient had a PCN reaction causing immediate rash, facial/tongue/throat swelling, SOB or lightheadedness with hypotension: No Has patient had a PCN reaction causing severe rash involving mucus membranes or skin necrosis: hives Has patient had a PCN reaction that required hospitalization No Has patient had a PCN reaction occurring within the last 10 years: yes If all of the above answers are "NO", then may proceed with Cephalosporin use.   :   Family History  Problem Relation Age of Onset   Cancer Mother        breast, and in remission   Diabetes Mother    Hypertension Mother    Colon cancer Mother        dx at age 9   Congestive Heart Failure Father    Hypertension Father    Diabetes Maternal Grandmother     Hypertension Maternal Grandmother    Diabetes Maternal Grandfather    Kidney disease Maternal Grandfather    Diabetes Paternal Grandmother    Cancer Paternal Grandfather        colon   Autism Son    Heart disease Paternal Aunt    Cancer Paternal Uncle        colon  :   Social History   Socioeconomic History   Marital status: Married    Spouse name: Insurance underwriter   Number of children: 3   Years of education: Not on file   Highest education level: Not on file  Occupational History   Not on file  Tobacco Use   Smoking status: Never   Smokeless tobacco: Never  Vaping Use   Vaping Use: Never used  Substance and Sexual Activity   Alcohol use: Not Currently   Drug use: No   Sexual activity: Yes    Partners: Male    Birth control/protection: None  Other Topics Concern   Not on file  Social History Narrative   Not on file   Social Determinants of Health   Financial Resource Strain: Not on file  Food Insecurity: Not on file  Transportation Needs: Not on file  Physical Activity: Not on file  Stress: Not on file  Social Connections: Not on file  Intimate Partner Violence: Not on file  :  Review of Systems: A comprehensive 14 point review of systems was negative except as noted in the HPI.  Exam: Patient Vitals for the past 24 hrs:  BP Temp Temp src Pulse Resp SpO2 Weight  04/24/21 0700 112/78 98.2 F (36.8 C) Oral (!) 110 20 99 % 102.4 kg  04/24/21 0600 -- -- -- (!) 117 (!) 23 (!) 84 % --  04/24/21 0500 102/68 -- -- (!) 114 (!) 23 100 % --  04/24/21 0400 96/60 98.3 F (36.8 C) Oral (!) 106 19 98 % --  04/24/21 0300 (!) 88/47 -- -- (!) 107 19  98 % --  04/24/21 0200 (!) 97/58 -- -- (!) 107 20 96 % --  04/24/21 0100 104/77 -- -- (!) 109 (!) 24 99 % --  04/24/21 0000 110/78 98.2 F (36.8 C) Oral (!) 122 (!) 26 100 % --  04/23/21 2300 112/79 -- -- (!) 126 (!) 26 100 % --  04/23/21 2200 113/84 -- -- (!) 130 (!) 32 100 % --  04/23/21 2100 -- -- -- (!) 118 (!) 22 99 % --   04/23/21 2000 -- 98.1 F (36.7 C) Axillary (!) 125 (!) 26 100 % --  04/23/21 1900 108/76 -- -- -- (!) 22 -- --  04/23/21 1752 -- -- -- -- -- -- 100.5 kg  04/23/21 1600 119/90 -- -- (!) 126 17 100 % --  04/23/21 1415 116/86 98.9 F (37.2 C) Oral (!) 121 (!) 26 100 % --  04/23/21 1200 99/73 -- -- (!) 50 19 (!) 28 % --  04/23/21 1159 105/72 98.1 F (36.7 C) Oral (!) 120 (!) 25 100 % --  04/23/21 1144 109/81 98.3 F (36.8 C) Oral (!) 118 (!) 25 100 % --    General:  well-nourished in no acute distress.   Eyes:  no scleral icterus.   ENT:  There were no oropharyngeal lesions.     Lymphatics:  Negative cervical, supraclavicular or axillary adenopathy.   Respiratory: lungs were clear bilaterally without wheezing or crackles.   Cardiovascular: Echocardiac, no pedal edema.   GI: Positive bowel sounds, soft, wound VAC in place.  Left lower quadrant ostomy.  She has a right abdominal drain.  Skin exam was without echymosis, petichae.   Neuro exam was nonfocal. Patient was alert and oriented.  Attention was good.   Language was appropriate.  Mood was normal without depression.  Speech was not pressured.  Thought content was not tangential.     Lab Results  Component Value Date   WBC 12.7 (H) 04/24/2021   HGB 7.5 (L) 04/24/2021   HCT 23.7 (L) 04/24/2021   PLT 538 (H) 04/24/2021   GLUCOSE 92 04/24/2021   CHOL 155 10/02/2019   TRIG 71 04/21/2021   HDL 39 (L) 10/02/2019   LDLCALC 99 10/02/2019   ALT 10 04/24/2021   AST 11 (L) 04/24/2021   NA 135 04/24/2021   K 4.9 04/24/2021   CL 99 04/24/2021   CREATININE <0.30 (L) 04/24/2021   BUN 22 (H) 04/24/2021   CO2 31 04/24/2021    DG Abd 1 View  Result Date: 04/15/2021 CLINICAL DATA:  Incorrect needle count. EXAM: ABDOMEN - 1 VIEW COMPARISON:  None. FINDINGS: There is an extensive amount of overlying tubing and suction bulb which may obscure a small needle. Large amount of soft tissue gas is noted. This may also obscure needle. No definite  evidence of surgical needle is noted. IMPRESSION: Exam is significantly limited due to overlying tubing and suction bulb as well as a large amount of soft tissue gas. No definite radiopaque needle is noted, although this may be obscured due to overlying artifact as described above. These results were called by telephone at the time of interpretation on 04/15/2021 at 4:10 pm to provider Dr. Johney Maine, who verbally acknowledged these results. Electronically Signed   By: Marijo Conception M.D.   On: 04/15/2021 16:11   CT Angio Chest PE W and/or Wo Contrast  Result Date: 04/10/2021 CLINICAL DATA:  High probability PE. Being currently treated for abscess diverticulitis. Tachycardia. EXAM: CT ANGIOGRAPHY CHEST CT ABDOMEN  AND PELVIS WITH CONTRAST TECHNIQUE: Multidetector CT imaging of the chest was performed using the standard protocol during bolus administration of intravenous contrast. Multiplanar CT image reconstructions and MIPs were obtained to evaluate the vascular anatomy. Multidetector CT imaging of the abdomen and pelvis was performed using the standard protocol during bolus administration of intravenous contrast. CONTRAST:  55m OMNIPAQUE IOHEXOL 350 MG/ML SOLN COMPARISON:  CT abdomen and pelvis 04/01/2021. CT abdomen and pelvis 11/27/2020. FINDINGS: CTA CHEST FINDINGS Cardiovascular: Satisfactory opacification of the pulmonary arteries to the segmental level. No evidence of pulmonary embolism. Normal heart size. No pericardial effusion. Mediastinum/Nodes: No enlarged mediastinal, hilar, or axillary lymph nodes. Thyroid gland, trachea, and esophagus demonstrate no significant findings. Lungs/Pleura: Lungs are clear. No pleural effusion or pneumothorax. Musculoskeletal: No chest wall abnormality. No acute or significant osseous findings. Review of the MIP images confirms the above findings. CT ABDOMEN and PELVIS FINDINGS Hepatobiliary: There is diffuse fatty infiltration of the liver. Gallstones are present. There is  no biliary ductal dilatation. Pancreas: Unremarkable. No pancreatic ductal dilatation or surrounding inflammatory changes. Spleen: Normal in size without focal abnormality. Adrenals/Urinary Tract: There is a 3.5 x 2.8 cm stable left adrenal nodule previously characterized as adenoma. Right adrenal gland is within normal limits. There is a cyst in the inferior pole the left kidney measuring 3 cm, unchanged. The kidneys otherwise appear within normal limits. The bladder is within normal limits. Stomach/Bowel: There is no evidence for bowel obstruction. There is marked focal wall thickening of the mid descending colon with surrounding inflammatory stranding. This has slightly increased when compared to the prior study. There is a small amount of extraluminal gas compatible with perforation. Gas has increased when compared to the prior examination. Stomach is within normal limits. Vascular/Lymphatic: No significant vascular findings are present. Aorta and IVC are normal in size. There are prominent and enlarged central mesenteric lymph nodes which are new from the prior examination. The largest lymph node measures 1.5 by 2.4 cm image 4/49. Reproductive: Again seen is a hypodense lesion in the fundus of the uterus containing some calcifications measuring 4.7 cm similar to the prior study. Adnexa are within normal limits. Other: Previously identified percutaneous drainage catheter in the anterior left abdomen has been pulled back in the interval. The distal catheter tip is in the deep subcutaneous soft tissues of the anterior abdominal wall. Previously identified left lateral percutaneous drainage catheter is unchanged in position. The distal portion of the catheter is in a new lobulated enhancing fluid collection containing air abutting the inflamed portion of the colon. This collection measures 8.5 x 3.5 x 10.0 cm and is now within the adjacent iliopsoas musculature and abutting the left iliacus musculature. This fluid  collection also extends through the left lateral abdominal wall on image 4/51, a new finding. There is trace free fluid in the pelvis. There is no focal abdominal wall hernia. Musculoskeletal: No acute or significant osseous findings. Review of the MIP images confirms the above findings. IMPRESSION: 1. Again seen are findings compatible with descending colon diverticulitis with perforation. Free air and inflammation have increased in the interval. 2. New lobulated enhancing fluid collection posterior to this segment of inflamed colon measuring 8.5 x 3.5 x 10.0 cm. This collection now invades the adjacent iliopsoas muscle as well as extends through the left lateral abdominal wall. The tip of the drainage catheter is in this collection. Findings are compatible with abscess. 3. Anterior left percutaneous drainage catheter tip has been pulled back and is now within  the subcutaneous tissues. 4. Trace free fluid. 5. No pulmonary embolism.  No acute cardiopulmonary process. 6. Cholelithiasis. 7. Fatty infiltration of the liver. Electronically Signed   By: Ronney Asters M.D.   On: 04/10/2021 19:01   CT ABDOMEN PELVIS W CONTRAST  Result Date: 04/10/2021 CLINICAL DATA:  High probability PE. Being currently treated for abscess diverticulitis. Tachycardia. EXAM: CT ANGIOGRAPHY CHEST CT ABDOMEN AND PELVIS WITH CONTRAST TECHNIQUE: Multidetector CT imaging of the chest was performed using the standard protocol during bolus administration of intravenous contrast. Multiplanar CT image reconstructions and MIPs were obtained to evaluate the vascular anatomy. Multidetector CT imaging of the abdomen and pelvis was performed using the standard protocol during bolus administration of intravenous contrast. CONTRAST:  51m OMNIPAQUE IOHEXOL 350 MG/ML SOLN COMPARISON:  CT abdomen and pelvis 04/01/2021. CT abdomen and pelvis 11/27/2020. FINDINGS: CTA CHEST FINDINGS Cardiovascular: Satisfactory opacification of the pulmonary arteries to  the segmental level. No evidence of pulmonary embolism. Normal heart size. No pericardial effusion. Mediastinum/Nodes: No enlarged mediastinal, hilar, or axillary lymph nodes. Thyroid gland, trachea, and esophagus demonstrate no significant findings. Lungs/Pleura: Lungs are clear. No pleural effusion or pneumothorax. Musculoskeletal: No chest wall abnormality. No acute or significant osseous findings. Review of the MIP images confirms the above findings. CT ABDOMEN and PELVIS FINDINGS Hepatobiliary: There is diffuse fatty infiltration of the liver. Gallstones are present. There is no biliary ductal dilatation. Pancreas: Unremarkable. No pancreatic ductal dilatation or surrounding inflammatory changes. Spleen: Normal in size without focal abnormality. Adrenals/Urinary Tract: There is a 3.5 x 2.8 cm stable left adrenal nodule previously characterized as adenoma. Right adrenal gland is within normal limits. There is a cyst in the inferior pole the left kidney measuring 3 cm, unchanged. The kidneys otherwise appear within normal limits. The bladder is within normal limits. Stomach/Bowel: There is no evidence for bowel obstruction. There is marked focal wall thickening of the mid descending colon with surrounding inflammatory stranding. This has slightly increased when compared to the prior study. There is a small amount of extraluminal gas compatible with perforation. Gas has increased when compared to the prior examination. Stomach is within normal limits. Vascular/Lymphatic: No significant vascular findings are present. Aorta and IVC are normal in size. There are prominent and enlarged central mesenteric lymph nodes which are new from the prior examination. The largest lymph node measures 1.5 by 2.4 cm image 4/49. Reproductive: Again seen is a hypodense lesion in the fundus of the uterus containing some calcifications measuring 4.7 cm similar to the prior study. Adnexa are within normal limits. Other: Previously  identified percutaneous drainage catheter in the anterior left abdomen has been pulled back in the interval. The distal catheter tip is in the deep subcutaneous soft tissues of the anterior abdominal wall. Previously identified left lateral percutaneous drainage catheter is unchanged in position. The distal portion of the catheter is in a new lobulated enhancing fluid collection containing air abutting the inflamed portion of the colon. This collection measures 8.5 x 3.5 x 10.0 cm and is now within the adjacent iliopsoas musculature and abutting the left iliacus musculature. This fluid collection also extends through the left lateral abdominal wall on image 4/51, a new finding. There is trace free fluid in the pelvis. There is no focal abdominal wall hernia. Musculoskeletal: No acute or significant osseous findings. Review of the MIP images confirms the above findings. IMPRESSION: 1. Again seen are findings compatible with descending colon diverticulitis with perforation. Free air and inflammation have increased in the interval.  2. New lobulated enhancing fluid collection posterior to this segment of inflamed colon measuring 8.5 x 3.5 x 10.0 cm. This collection now invades the adjacent iliopsoas muscle as well as extends through the left lateral abdominal wall. The tip of the drainage catheter is in this collection. Findings are compatible with abscess. 3. Anterior left percutaneous drainage catheter tip has been pulled back and is now within the subcutaneous tissues. 4. Trace free fluid. 5. No pulmonary embolism.  No acute cardiopulmonary process. 6. Cholelithiasis. 7. Fatty infiltration of the liver. Electronically Signed   By: Ronney Asters M.D.   On: 04/10/2021 19:01   CT Abdomen Pelvis W Contrast  Result Date: 04/03/2021 CLINICAL DATA:  Diverticulitis, status post drainage. EXAM: CT ABDOMEN AND PELVIS WITH CONTRAST TECHNIQUE: Multidetector CT imaging of the abdomen and pelvis was performed using the  standard protocol following bolus administration of intravenous contrast. CONTRAST:  11m OMNIPAQUE IOHEXOL 350 MG/ML SOLN COMPARISON:  CT abdomen and pelvis 02/04/2021; X-ray chest 03/31/2016. FINDINGS: Lower chest: No acute abnormality. Hepatobiliary: Hepatic steatosis. Cholelithiasis. No biliary dilatation. Pancreas: Unremarkable. No pancreatic ductal dilatation or surrounding inflammatory changes. Spleen: Normal in size without focal abnormality. Adrenals/Urinary Tract: Stable 3.7 cm left adrenal lesion. Right adrenal gland appears normal. No hydronephrosis or renal obstruction is noted. Stable left renal cyst. No renal or ureteral calculi are noted. Urinary bladder is unremarkable. Stomach/Bowel: The stomach is unremarkable. The surgical drain seen within a small bowel loop in the left lower quadrant on prior exam has been retracted and no longer appears to be within the bowel or peritoneal space. There appears to be significant enhancement and irregularity involving the descending colon with a large amount of scarring and retraction involving adjacent small bowel loops. This does not appear to be significantly changed compared to prior exam and is consistent with a history of diverticulitis. Stable position of percutaneous drainage catheter is seen with tip adjacent to the left psoas muscle. No significant fluid collection remains. Stool is noted in the more distal sigmoid colon and rectum. Vascular/Lymphatic: There appears to be some rotation of mesenteric structures in the right lower quadrant which may be new since prior exam, suggesting partial volvulus or malrotation. There is also noted the interval development of several mesenteric lymph nodes in this area, most likely inflammatory or reactive in etiology. However, no significant bowel wall thickening or dilatation is seen in this area. Reproductive: Calcified uterine fibroid is again noted. No adnexal abnormality is noted. Other: Mild amount of free  fluid is noted in the pelvis. No hernia is noted. Musculoskeletal: No acute or significant osseous findings. IMPRESSION: There appears to be significant enhancement and wall thickening involving the descending colon with some degree of traction and involvement of adjacent small bowel loops. This is consistent with the history of diverticulitis and perforation. Stable position of percutaneous drainage catheter is seen adjacent to left psoas muscle with no significant residual fluid remaining. The other percutaneous drainage catheter that was previously noted to be within small bowel loop on prior exam in the left lower quadrant, has significantly retracted and appears to be outside of the peritoneal space at this time. Since the prior exam, there does appear to be some degree of rotation involving mesenteric vessels and structures in the right lower quadrant, suggesting partial volvulus or malrotation. There is the interval development of several lymph nodes in this area, most likely inflammatory or reactive in etiology. Mild amount of free fluid is also noted in the pelvis. However, no  significant bowel wall thickening or dilatation is seen in this area. These results will be called to the ordering clinician or representative by the Radiologist Assistant, and communication documented in the PACS or zVision Dashboard. Stable uterine fibroid. Hepatic steatosis. 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 Cholelithiasis. Electronically Signed   By: Marijo Conception M.D.   On: 04/03/2021 21:53   IR Catheter Tube Change  Result Date: 04/14/2021 CLINICAL DATA:  Large intra-abscess. malfunctioning indwelling drain. EXAM: IR DRAINAGE CATHETER EVALUATION, EXCHANGE AND UPSIZE COMPARISON:  CT Abdomen Pelvis, 04/11/2019. IR fluoroscopy, 02/19/2021. CONTRAST:  10 mL Omnipaque 300-administered via the percutaneous drainage catheter. MEDICATIONS: None. ANESTHESIA/SEDATION: Local anesthetic was administered. FLUOROSCOPY TIME:  3 minutes  33 seconds.  10 mGy. TECHNIQUE: Patient was positioned supine on the fluoroscopy table. The external portion of the existing percutaneous drainage catheter as well as the surrounding skin was prepped and draped in usual sterile fashion. A preprocedural spot fluoroscopic image was obtained of the existing percutaneous drainage catheter. A small amount of contrast was injected via the existing percutaneous drainage catheter and several fluoroscopic images were obtained in various obliquities. The external portion of the percutaneous drainage catheter was cut and cannulated with a short Amplatz wire. Under intermittent fluoroscopic guidance, the existing percutaneous drainage catheter was exchanged for a new 87 French percutaneous drainage catheter with end coiled and locked within the decompressed abscess cavity. Contrast injection confirmed appropriate position functionality of the percutaneous drainage catheter. The percutaneous drainage catheter was connected to a gravity drainage bag and secured in place within 0-silk interrupted suture and a StatLock device. A dressing was applied. The patient tolerated the procedure well without immediate postprocedural complication. FINDINGS: 1. Malfunctioning drain with large residual intra-abdominal abscess. 2. Multi compartment abscess, with patient unable to tolerate further investigation and multiple drain placement. IMPRESSION: Successful fluoroscopic-guided LEFT lower quadrant drainage catheter exchange and upsize, as above. PLAN: Continue with previous drain care, including routine flushes for catheter patency. Interventional radiology will follow patient. Drain interrogation to be performed in 2 weeks, or if earlier suspected catheter malfunction. Michaelle Birks, MD [facility] Electronically Signed   By: Michaelle Birks M.D.   On: 04/14/2021 10:52   DG CHEST PORT 1 VIEW  Result Date: 04/16/2021 CLINICAL DATA:   Respiratory failure EXAM: PORTABLE CHEST 1 VIEW COMPARISON:  Chest x-ray 04/15/2021 FINDINGS: Endotracheal tube tip is 2.7 cm above the carina. Enteric tube tip is in the distal stomach region. Right PICC tip is near the superior aspect of the SVC. Heart size and mediastinum are within normal limits. No consolidation or edema. No pleural effusion or pneumothorax. Small subcutaneous emphysema bilaterally appears similar to slightly decreased since previous study. IMPRESSION: Medical devices as described.  No acute process identified. Electronically Signed   By: Ofilia Neas M.D.   On: 04/16/2021 11:46   DG CHEST PORT 1 VIEW  Result Date: 04/15/2021 CLINICAL DATA:  Intubation. EXAM: PORTABLE CHEST 1 VIEW COMPARISON:  Radiographs 04/12/2021 and 04/10/2021.  CT 04/10/2021. FINDINGS: 1713 hours. Tip of the endotracheal tube overlies the mid trachea. An enteric tube projects below the diaphragm into the right upper quadrant of the abdomen, likely in the distal stomach. Right arm PICC projects to the level of the superior SVC. There is mild patient rotation to the right and elevation of the right hemidiaphragm associated with increased right basilar subsegmental atelectasis. The lungs are otherwise clear. There is no pleural effusion or pneumothorax. Suggested soft tissue emphysema within the  abdominal flanks bilaterally. IMPRESSION: 1. Satisfactory position of the endotracheal and enteric tubes. 2. Right arm PICC appears to have been pulled back to the level of the upper SVC. 3. Suggested soft tissue emphysema within both abdominal flanks. Electronically Signed   By: Richardean Sale M.D.   On: 04/15/2021 19:26   DG CHEST PORT 1 VIEW  Result Date: 04/12/2021 CLINICAL DATA:  PICC placement EXAM: PORTABLE CHEST 1 VIEW COMPARISON:  04/10/2021 FINDINGS: Right arm PICC tip in the proximal SVC. Lungs remain clear without infiltrate or effusion. IMPRESSION: PICC tip in the proximal SVC.  Lungs are clear  Electronically Signed   By: Franchot Gallo M.D.   On: 04/12/2021 19:55   DG Chest Portable 1 View  Result Date: 04/10/2021 CLINICAL DATA:  Weakness. EXAM: PORTABLE CHEST 1 VIEW COMPARISON:  March 31, 2016 FINDINGS: The heart size and mediastinal contours are within normal limits. Both lungs are clear. The visualized skeletal structures are unremarkable. IMPRESSION: No active disease. Electronically Signed   By: Virgina Norfolk M.D.   On: 04/10/2021 15:51   ECHOCARDIOGRAM COMPLETE  Result Date: 04/14/2021    ECHOCARDIOGRAM REPORT   Patient Name:   Mikayla Rios Date of Exam: 04/14/2021 Medical Rec #:  [id number]      Height:       63.0 in Accession #:    [id number]     Weight:       200.0 lb Date of Birth:  15-Jul-1985       BSA:          1.934 m Patient Age:    51 years       BP:           109/82 mmHg Patient Gender: F              HR:           133 bpm. Exam Location:  Inpatient Procedure: 2D Echo, Cardiac Doppler and Color Doppler Indications:    I47.2 Ventricular tachycardia  History:        Patient has prior history of Echocardiogram examinations, most                 recent 11/09/2019. Arrythmias:Prolonged QT interval; Risk                 Factors:Hypertension.  Sonographer:    Glo Herring Referring Phys: Theola Sequin IMPRESSIONS  1. Left ventricular ejection fraction, by estimation, is 60 to 65%. The left ventricle has normal function. The left ventricle has no regional wall motion abnormalities. Indeterminate diastolic filling due to E-A fusion.  2. Right ventricular systolic function is normal. The right ventricular size is normal.  3. The mitral valve is normal in structure. No evidence of mitral valve regurgitation. No evidence of mitral stenosis.  4. The aortic valve is normal in structure. Aortic valve regurgitation is not visualized. No aortic stenosis is present.  5. The inferior vena cava is normal in size with greater than 50% respiratory variability, suggesting right atrial pressure  of 3 mmHg. FINDINGS  Left Ventricle: Left ventricular ejection fraction, by estimation, is 60 to 65%. The left ventricle has normal function. The left ventricle has no regional wall motion abnormalities. The left ventricular internal cavity size was normal in size. There is  no left ventricular hypertrophy. Indeterminate diastolic filling due to E-A fusion. Right Ventricle: The right ventricular size is normal. No increase in right ventricular wall thickness. Right ventricular systolic function is normal. Left  Atrium: Left atrial size was normal in size. Right Atrium: Right atrial size was normal in size. Pericardium: There is no evidence of pericardial effusion. Mitral Valve: The mitral valve is normal in structure. No evidence of mitral valve regurgitation. No evidence of mitral valve stenosis. Tricuspid Valve: The tricuspid valve is normal in structure. Tricuspid valve regurgitation is not demonstrated. No evidence of tricuspid stenosis. Aortic Valve: The aortic valve is normal in structure. Aortic valve regurgitation is not visualized. No aortic stenosis is present. Aortic valve mean gradient measures 4.0 mmHg. Aortic valve peak gradient measures 8.6 mmHg. Pulmonic Valve: The pulmonic valve was normal in structure. Pulmonic valve regurgitation is not visualized. No evidence of pulmonic stenosis. Aorta: The aortic root is normal in size and structure. Venous: The inferior vena cava is normal in size with greater than 50% respiratory variability, suggesting right atrial pressure of 3 mmHg. IAS/Shunts: No atrial level shunt detected by color flow Doppler.  LEFT VENTRICLE PLAX 2D LVIDd:         3.80 cm LVIDs:         2.30 cm LV PW:         1.00 cm LV IVS:        1.00 cm  RIGHT VENTRICLE RV S prime:     21.90 cm/s LEFT ATRIUM             Index LA diam:        3.50 cm 1.81 cm/m LA Vol (A2C):   32.1 ml 16.60 ml/m LA Vol (A4C):   29.5 ml 15.26 ml/m LA Biplane Vol: 32.3 ml 16.70 ml/m  AORTIC VALVE                    PULMONIC VALVE AV Vmax:           147.00 cm/s PV Vmax:       1.21 m/s AV Vmean:          96.600 cm/s PV Peak grad:  5.9 mmHg AV VTI:            0.168 m AV Peak Grad:      8.6 mmHg AV Mean Grad:      4.0 mmHg LVOT Vmax:         85.50 cm/s LVOT Vmean:        53.100 cm/s LVOT VTI:          0.097 m LVOT/AV VTI ratio: 0.58  AORTA Ao Root diam: 3.00 cm Ao Asc diam:  2.90 cm  SHUNTS Systemic VTI: 0.10 m Dani Gobble Croitoru MD Electronically signed by Sanda Klein MD Signature Date/Time: 04/14/2021/1:18:01 PM    Final    VAS [place] UPPER EXTREMITY VENOUS DUPLEX  Result Date: 04/20/2021 UPPER VENOUS STUDY  Patient Name:  Mikayla Rios  Date of Exam:   04/19/2021 Medical Rec #: [id number]       Accession #:    [id number] Date of Birth: 02-05-1986        Patient Gender: F Patient Age:   28 years Exam Location:  [facility] Procedure:      VAS [place] UPPER EXTREMITY VENOUS DUPLEX Referring Phys: Jolaine Artist MEMON --------------------------------------------------------------------------------  Indications: Edema Limitations: Poor ultrasound/tissue interface and body habitus. Comparison Study: No prior study Performing Technologist: Maudry Mayhew MHA, RDMS, RVT, RDCS  Examination Guidelines: A complete evaluation includes B-mode imaging, spectral Doppler, color Doppler, and power Doppler as needed of all accessible portions of each vessel. Bilateral testing is considered an integral part of  a complete examination. Limited examinations for reoccurring indications may be performed as noted.  Right Findings: +----------+------------+---------+-----------+----------+-------+ RIGHT     CompressiblePhasicitySpontaneousPropertiesSummary +----------+------------+---------+-----------+----------+-------+ Subclavian               Yes       Yes                      +----------+------------+---------+-----------+----------+-------+  Left Findings: +----------+------------+---------+-----------+----------+--------------+  LEFT      CompressiblePhasicitySpontaneousProperties   Summary     +----------+------------+---------+-----------+----------+--------------+ IJV           Full       Yes       Yes                             +----------+------------+---------+-----------+----------+--------------+ Subclavian    Full       Yes       Yes                             +----------+------------+---------+-----------+----------+--------------+ Axillary      Full       Yes       Yes                             +----------+------------+---------+-----------+----------+--------------+ Brachial      Full       Yes       Yes                             +----------+------------+---------+-----------+----------+--------------+ Radial        Full                                                 +----------+------------+---------+-----------+----------+--------------+ Ulnar         Full                                                 +----------+------------+---------+-----------+----------+--------------+ Cephalic      Full                                                 +----------+------------+---------+-----------+----------+--------------+ Basilic                                             Not visualized +----------+------------+---------+-----------+----------+--------------+  Summary:  Right: No evidence of thrombosis in the subclavian.  Left: No evidence of deep vein thrombosis in the upper extremity. No evidence of superficial vein thrombosis involving visualized veins of the left upper extremity.  *See table(s) above for measurements and observations.  Diagnosing physician: Monica Martinez MD Electronically signed by Monica Martinez MD on 04/20/2021 at 11:59:48 AM.    Final    [place] EKG Site Rite  Result Date: 04/17/2021 If [facility] image not attached, placement could not be  confirmed due to current cardiac rhythm.  [place] EKG SITE RITE  Result Date: 04/12/2021 If Site  Rite image not attached, placement could not be confirmed due to current cardiac rhythm.    DG Abd 1 View  Result Date: 04/15/2021 CLINICAL DATA:  Incorrect needle count. EXAM: ABDOMEN - 1 VIEW COMPARISON:  None. FINDINGS: There is an extensive amount of overlying tubing and suction bulb which may obscure a small needle. Large amount of soft tissue gas is noted. This may also obscure needle. No definite evidence of surgical needle is noted. IMPRESSION: Exam is significantly limited due to overlying tubing and suction bulb as well as a large amount of soft tissue gas. No definite radiopaque needle is noted, although this may be obscured due to overlying artifact as described above. These results were called by telephone at the time of interpretation on 04/15/2021 at 4:10 pm to provider Dr. Johney Maine, who verbally acknowledged these results. Electronically Signed   By: Marijo Conception M.D.   On: 04/15/2021 16:11   CT Angio Chest PE W and/or Wo Contrast  Result Date: 04/10/2021 CLINICAL DATA:  High probability PE. Being currently treated for abscess diverticulitis. Tachycardia. EXAM: CT ANGIOGRAPHY CHEST CT ABDOMEN AND PELVIS WITH CONTRAST TECHNIQUE: Multidetector CT imaging of the chest was performed using the standard protocol during bolus administration of intravenous contrast. Multiplanar CT image reconstructions and MIPs were obtained to evaluate the vascular anatomy. Multidetector CT imaging of the abdomen and pelvis was performed using the standard protocol during bolus administration of intravenous contrast. CONTRAST:  58m OMNIPAQUE IOHEXOL 350 MG/ML SOLN COMPARISON:  CT abdomen and pelvis 04/01/2021. CT abdomen and pelvis 11/27/2020. FINDINGS: CTA CHEST FINDINGS Cardiovascular: Satisfactory opacification of the pulmonary arteries to the segmental level. No evidence of pulmonary embolism. Normal heart size. No pericardial effusion. Mediastinum/Nodes: No enlarged mediastinal, hilar, or axillary lymph nodes.  Thyroid gland, trachea, and esophagus demonstrate no significant findings. Lungs/Pleura: Lungs are clear. No pleural effusion or pneumothorax. Musculoskeletal: No chest wall abnormality. No acute or significant osseous findings. Review of the MIP images confirms the above findings. CT ABDOMEN and PELVIS FINDINGS Hepatobiliary: There is diffuse fatty infiltration of the liver. Gallstones are present. There is no biliary ductal dilatation. Pancreas: Unremarkable. No pancreatic ductal dilatation or surrounding inflammatory changes. Spleen: Normal in size without focal abnormality. Adrenals/Urinary Tract: There is a 3.5 x 2.8 cm stable left adrenal nodule previously characterized as adenoma. Right adrenal gland is within normal limits. There is a cyst in the inferior pole the left kidney measuring 3 cm, unchanged. The kidneys otherwise appear within normal limits. The bladder is within normal limits. Stomach/Bowel: There is no evidence for bowel obstruction. There is marked focal wall thickening of the mid descending colon with surrounding inflammatory stranding. This has slightly increased when compared to the prior study. There is a small amount of extraluminal gas compatible with perforation. Gas has increased when compared to the prior examination. Stomach is within normal limits. Vascular/Lymphatic: No significant vascular findings are present. Aorta and IVC are normal in size. There are prominent and enlarged central mesenteric lymph nodes which are new from the prior examination. The largest lymph node measures 1.5 by 2.4 cm image 4/49. Reproductive: Again seen is a hypodense lesion in the fundus of the uterus containing some calcifications measuring 4.7 cm similar to the prior study. Adnexa are within normal limits. Other: Previously identified percutaneous drainage catheter in the anterior left abdomen has been pulled back in the interval. The distal catheter  tip is in the deep subcutaneous soft tissues of the  anterior abdominal wall. Previously identified left lateral percutaneous drainage catheter is unchanged in position. The distal portion of the catheter is in a new lobulated enhancing fluid collection containing air abutting the inflamed portion of the colon. This collection measures 8.5 x 3.5 x 10.0 cm and is now within the adjacent iliopsoas musculature and abutting the left iliacus musculature. This fluid collection also extends through the left lateral abdominal wall on image 4/51, a new finding. There is trace free fluid in the pelvis. There is no focal abdominal wall hernia. Musculoskeletal: No acute or significant osseous findings. Review of the MIP images confirms the above findings. IMPRESSION: 1. Again seen are findings compatible with descending colon diverticulitis with perforation. Free air and inflammation have increased in the interval. 2. New lobulated enhancing fluid collection posterior to this segment of inflamed colon measuring 8.5 x 3.5 x 10.0 cm. This collection now invades the adjacent iliopsoas muscle as well as extends through the left lateral abdominal wall. The tip of the drainage catheter is in this collection. Findings are compatible with abscess. 3. Anterior left percutaneous drainage catheter tip has been pulled back and is now within the subcutaneous tissues. 4. Trace free fluid. 5. No pulmonary embolism.  No acute cardiopulmonary process. 6. Cholelithiasis. 7. Fatty infiltration of the liver. Electronically Signed   By: Ronney Asters M.D.   On: 04/10/2021 19:01   CT ABDOMEN PELVIS W CONTRAST  Result Date: 04/10/2021 CLINICAL DATA:  High probability PE. Being currently treated for abscess diverticulitis. Tachycardia. EXAM: CT ANGIOGRAPHY CHEST CT ABDOMEN AND PELVIS WITH CONTRAST TECHNIQUE: Multidetector CT imaging of the chest was performed using the standard protocol during bolus administration of intravenous contrast. Multiplanar CT image reconstructions and MIPs were obtained  to evaluate the vascular anatomy. Multidetector CT imaging of the abdomen and pelvis was performed using the standard protocol during bolus administration of intravenous contrast. CONTRAST:  45m OMNIPAQUE IOHEXOL 350 MG/ML SOLN COMPARISON:  CT abdomen and pelvis 04/01/2021. CT abdomen and pelvis 11/27/2020. FINDINGS: CTA CHEST FINDINGS Cardiovascular: Satisfactory opacification of the pulmonary arteries to the segmental level. No evidence of pulmonary embolism. Normal heart size. No pericardial effusion. Mediastinum/Nodes: No enlarged mediastinal, hilar, or axillary lymph nodes. Thyroid gland, trachea, and esophagus demonstrate no significant findings. Lungs/Pleura: Lungs are clear. No pleural effusion or pneumothorax. Musculoskeletal: No chest wall abnormality. No acute or significant osseous findings. Review of the MIP images confirms the above findings. CT ABDOMEN and PELVIS FINDINGS Hepatobiliary: There is diffuse fatty infiltration of the liver. Gallstones are present. There is no biliary ductal dilatation. Pancreas: Unremarkable. No pancreatic ductal dilatation or surrounding inflammatory changes. Spleen: Normal in size without focal abnormality. Adrenals/Urinary Tract: There is a 3.5 x 2.8 cm stable left adrenal nodule previously characterized as adenoma. Right adrenal gland is within normal limits. There is a cyst in the inferior pole the left kidney measuring 3 cm, unchanged. The kidneys otherwise appear within normal limits. The bladder is within normal limits. Stomach/Bowel: There is no evidence for bowel obstruction. There is marked focal wall thickening of the mid descending colon with surrounding inflammatory stranding. This has slightly increased when compared to the prior study. There is a small amount of extraluminal gas compatible with perforation. Gas has increased when compared to the prior examination. Stomach is within normal limits. Vascular/Lymphatic: No significant vascular findings are  present. Aorta and IVC are normal in size. There are prominent and enlarged central mesenteric lymph  nodes which are new from the prior examination. The largest lymph node measures 1.5 by 2.4 cm image 4/49. Reproductive: Again seen is a hypodense lesion in the fundus of the uterus containing some calcifications measuring 4.7 cm similar to the prior study. Adnexa are within normal limits. Other: Previously identified percutaneous drainage catheter in the anterior left abdomen has been pulled back in the interval. The distal catheter tip is in the deep subcutaneous soft tissues of the anterior abdominal wall. Previously identified left lateral percutaneous drainage catheter is unchanged in position. The distal portion of the catheter is in a new lobulated enhancing fluid collection containing air abutting the inflamed portion of the colon. This collection measures 8.5 x 3.5 x 10.0 cm and is now within the adjacent iliopsoas musculature and abutting the left iliacus musculature. This fluid collection also extends through the left lateral abdominal wall on image 4/51, a new finding. There is trace free fluid in the pelvis. There is no focal abdominal wall hernia. Musculoskeletal: No acute or significant osseous findings. Review of the MIP images confirms the above findings. IMPRESSION: 1. Again seen are findings compatible with descending colon diverticulitis with perforation. Free air and inflammation have increased in the interval. 2. New lobulated enhancing fluid collection posterior to this segment of inflamed colon measuring 8.5 x 3.5 x 10.0 cm. This collection now invades the adjacent iliopsoas muscle as well as extends through the left lateral abdominal wall. The tip of the drainage catheter is in this collection. Findings are compatible with abscess. 3. Anterior left percutaneous drainage catheter tip has been pulled back and is now within the subcutaneous tissues. 4. Trace free fluid. 5. No pulmonary embolism.   No acute cardiopulmonary process. 6. Cholelithiasis. 7. Fatty infiltration of the liver. Electronically Signed   By: Ronney Asters M.D.   On: 04/10/2021 19:01   CT Abdomen Pelvis W Contrast  Result Date: 04/03/2021 CLINICAL DATA:  Diverticulitis, status post drainage. EXAM: CT ABDOMEN AND PELVIS WITH CONTRAST TECHNIQUE: Multidetector CT imaging of the abdomen and pelvis was performed using the standard protocol following bolus administration of intravenous contrast. CONTRAST:  72m OMNIPAQUE IOHEXOL 350 MG/ML SOLN COMPARISON:  CT abdomen and pelvis 02/04/2021; X-ray chest 03/31/2016. FINDINGS: Lower chest: No acute abnormality. Hepatobiliary: Hepatic steatosis. Cholelithiasis. No biliary dilatation. Pancreas: Unremarkable. No pancreatic ductal dilatation or surrounding inflammatory changes. Spleen: Normal in size without focal abnormality. Adrenals/Urinary Tract: Stable 3.7 cm left adrenal lesion. Right adrenal gland appears normal. No hydronephrosis or renal obstruction is noted. Stable left renal cyst. No renal or ureteral calculi are noted. Urinary bladder is unremarkable. Stomach/Bowel: The stomach is unremarkable. The surgical drain seen within a small bowel loop in the left lower quadrant on prior exam has been retracted and no longer appears to be within the bowel or peritoneal space. There appears to be significant enhancement and irregularity involving the descending colon with a large amount of scarring and retraction involving adjacent small bowel loops. This does not appear to be significantly changed compared to prior exam and is consistent with a history of diverticulitis. Stable position of percutaneous drainage catheter is seen with tip adjacent to the left psoas muscle. No significant fluid collection remains. Stool is noted in the more distal sigmoid colon and rectum. Vascular/Lymphatic: There appears to be some rotation of mesenteric structures in the right lower quadrant which may be new  since prior exam, suggesting partial volvulus or malrotation. There is also noted the interval development of several mesenteric lymph nodes in  this area, most likely inflammatory or reactive in etiology. However, no significant bowel wall thickening or dilatation is seen in this area. Reproductive: Calcified uterine fibroid is again noted. No adnexal abnormality is noted. Other: Mild amount of free fluid is noted in the pelvis. No hernia is noted. Musculoskeletal: No acute or significant osseous findings. IMPRESSION: There appears to be significant enhancement and wall thickening involving the descending colon with some degree of traction and involvement of adjacent small bowel loops. This is consistent with the history of diverticulitis and perforation. Stable position of percutaneous drainage catheter is seen adjacent to left psoas muscle with no significant residual fluid remaining. The other percutaneous drainage catheter that was previously noted to be within small bowel loop on prior exam in the left lower quadrant, has significantly retracted and appears to be outside of the peritoneal space at this time. Since the prior exam, there does appear to be some degree of rotation involving mesenteric vessels and structures in the right lower quadrant, suggesting partial volvulus or malrotation. There is the interval development of several lymph nodes in this area, most likely inflammatory or reactive in etiology. Mild amount of free fluid is also noted in the pelvis. However, no significant bowel wall thickening or dilatation is seen in this area. These results will be called to the ordering clinician or representative by the Radiologist Assistant, and communication documented in the PACS or zVision Dashboard. Stable uterine fibroid. Hepatic steatosis. 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 Cholelithiasis. Electronically Signed   By: Marijo Conception M.D.   On: 04/03/2021 21:53   IR Catheter Tube Change  Result  Date: 04/14/2021 CLINICAL DATA:  Large intra-abscess. malfunctioning indwelling drain. EXAM: IR DRAINAGE CATHETER EVALUATION, EXCHANGE AND UPSIZE COMPARISON:  CT Abdomen Pelvis, 04/11/2019. IR fluoroscopy, 02/19/2021. CONTRAST:  10 mL Omnipaque 300-administered via the percutaneous drainage catheter. MEDICATIONS: None. ANESTHESIA/SEDATION: Local anesthetic was administered. FLUOROSCOPY TIME:  3 minutes 33 seconds.  10 mGy. TECHNIQUE: Patient was positioned supine on the fluoroscopy table. The external portion of the existing percutaneous drainage catheter as well as the surrounding skin was prepped and draped in usual sterile fashion. A preprocedural spot fluoroscopic image was obtained of the existing percutaneous drainage catheter. A small amount of contrast was injected via the existing percutaneous drainage catheter and several fluoroscopic images were obtained in various obliquities. The external portion of the percutaneous drainage catheter was cut and cannulated with a short Amplatz wire. Under intermittent fluoroscopic guidance, the existing percutaneous drainage catheter was exchanged for a new 41 French percutaneous drainage catheter with end coiled and locked within the decompressed abscess cavity. Contrast injection confirmed appropriate position functionality of the percutaneous drainage catheter. The percutaneous drainage catheter was connected to a gravity drainage bag and secured in place within 0-silk interrupted suture and a StatLock device. A dressing was applied. The patient tolerated the procedure well without immediate postprocedural complication. FINDINGS: 1. Malfunctioning drain with large residual intra-abdominal abscess. 2. Multi compartment abscess, with patient unable to tolerate further investigation and multiple drain placement. IMPRESSION: Successful fluoroscopic-guided LEFT lower quadrant drainage catheter exchange and upsize, as above. PLAN: Continue with previous drain care,  including routine flushes for catheter patency. Interventional radiology will follow patient. Drain interrogation to be performed in 2 weeks, or if earlier suspected catheter malfunction. Michaelle Birks, MD [facility] Radiology Electronically Signed   By: Michaelle Birks M.D.   On: 04/14/2021 10:52   DG CHEST PORT 1 VIEW  Result Date: 04/16/2021  CLINICAL DATA:  Respiratory failure EXAM: PORTABLE CHEST 1 VIEW COMPARISON:  Chest x-ray 04/15/2021 FINDINGS: Endotracheal tube tip is 2.7 cm above the carina. Enteric tube tip is in the distal stomach region. Right PICC tip is near the superior aspect of the SVC. Heart size and mediastinum are within normal limits. No consolidation or edema. No pleural effusion or pneumothorax. Small subcutaneous emphysema bilaterally appears similar to slightly decreased since previous study. IMPRESSION: Medical devices as described.  No acute process identified. Electronically Signed   By: Ofilia Neas M.D.   On: 04/16/2021 11:46   DG CHEST PORT 1 VIEW  Result Date: 04/15/2021 CLINICAL DATA:  Intubation. EXAM: PORTABLE CHEST 1 VIEW COMPARISON:  Radiographs 04/12/2021 and 04/10/2021.  CT 04/10/2021. FINDINGS: 1713 hours. Tip of the endotracheal tube overlies the mid trachea. An enteric tube projects below the diaphragm into the right upper quadrant of the abdomen, likely in the distal stomach. Right arm PICC projects to the level of the superior SVC. There is mild patient rotation to the right and elevation of the right hemidiaphragm associated with increased right basilar subsegmental atelectasis. The lungs are otherwise clear. There is no pleural effusion or pneumothorax. Suggested soft tissue emphysema within the abdominal flanks bilaterally. IMPRESSION: 1. Satisfactory position of the endotracheal and enteric tubes. 2. Right arm PICC appears to have been pulled back to the level of the upper SVC. 3. Suggested soft tissue  emphysema within both abdominal flanks. Electronically Signed   By: Richardean Sale M.D.   On: 04/15/2021 19:26   DG CHEST PORT 1 VIEW  Result Date: 04/12/2021 CLINICAL DATA:  PICC placement EXAM: PORTABLE CHEST 1 VIEW COMPARISON:  04/10/2021 FINDINGS: Right arm PICC tip in the proximal SVC. Lungs remain clear without infiltrate or effusion. IMPRESSION: PICC tip in the proximal SVC.  Lungs are clear Electronically Signed   By: Franchot Gallo M.D.   On: 04/12/2021 19:55   DG Chest Portable 1 View  Result Date: 04/10/2021 CLINICAL DATA:  Weakness. EXAM: PORTABLE CHEST 1 VIEW COMPARISON:  March 31, 2016 FINDINGS: The heart size and mediastinal contours are within normal limits. Both lungs are clear. The visualized skeletal structures are unremarkable. IMPRESSION: No active disease. Electronically Signed   By: Virgina Norfolk M.D.   On: 04/10/2021 15:51   ECHOCARDIOGRAM COMPLETE  Result Date: 04/14/2021    ECHOCARDIOGRAM REPORT   Patient Name:   Mikayla Rios Date of Exam: 04/14/2021 Medical Rec #:  [id number]      Height:       63.0 in Accession #:    [id number]     Weight:       200.0 lb Date of Birth:  [birth year]       BSA:          1.934 m Patient Age:    61 years       BP:           109/82 mmHg Patient Gender: F              HR:           133 bpm. Exam Location:  Inpatient Procedure: 2D Echo, Cardiac Doppler and Color Doppler Indications:    I47.2 Ventricular tachycardia  History:        Patient has prior history of Echocardiogram examinations, most                 recent 11/09/2019. Arrythmias:Prolonged QT interval; Risk  Factors:Hypertension.  Sonographer:    Glo Herring Referring Phys: Theola Sequin IMPRESSIONS  1. Left ventricular ejection fraction, by estimation, is 60 to 65%. The left ventricle has normal function. The left ventricle has no regional wall motion abnormalities. Indeterminate diastolic filling due to E-A fusion.  2. Right ventricular systolic function is  normal. The right ventricular size is normal.  3. The mitral valve is normal in structure. No evidence of mitral valve regurgitation. No evidence of mitral stenosis.  4. The aortic valve is normal in structure. Aortic valve regurgitation is not visualized. No aortic stenosis is present.  5. The inferior vena cava is normal in size with greater than 50% respiratory variability, suggesting right atrial pressure of 3 mmHg. FINDINGS  Left Ventricle: Left ventricular ejection fraction, by estimation, is 60 to 65%. The left ventricle has normal function. The left ventricle has no regional wall motion abnormalities. The left ventricular internal cavity size was normal in size. There is  no left ventricular hypertrophy. Indeterminate diastolic filling due to E-A fusion. Right Ventricle: The right ventricular size is normal. No increase in right ventricular wall thickness. Right ventricular systolic function is normal. Left Atrium: Left atrial size was normal in size. Right Atrium: Right atrial size was normal in size. Pericardium: There is no evidence of pericardial effusion. Mitral Valve: The mitral valve is normal in structure. No evidence of mitral valve regurgitation. No evidence of mitral valve stenosis. Tricuspid Valve: The tricuspid valve is normal in structure. Tricuspid valve regurgitation is not demonstrated. No evidence of tricuspid stenosis. Aortic Valve: The aortic valve is normal in structure. Aortic valve regurgitation is not visualized. No aortic stenosis is present. Aortic valve mean gradient measures 4.0 mmHg. Aortic valve peak gradient measures 8.6 mmHg. Pulmonic Valve: The pulmonic valve was normal in structure. Pulmonic valve regurgitation is not visualized. No evidence of pulmonic stenosis. Aorta: The aortic root is normal in size and structure. Venous: The inferior vena cava is normal in size with greater than 50% respiratory variability, suggesting right atrial pressure of 3 mmHg. IAS/Shunts: No  atrial level shunt detected by color flow Doppler.  LEFT VENTRICLE PLAX 2D LVIDd:         3.80 cm LVIDs:         2.30 cm LV PW:         1.00 cm LV IVS:        1.00 cm  RIGHT VENTRICLE RV S prime:     21.90 cm/s LEFT ATRIUM             Index LA diam:        3.50 cm 1.81 cm/m LA Vol (A2C):   32.1 ml 16.60 ml/m LA Vol (A4C):   29.5 ml 15.26 ml/m LA Biplane Vol: 32.3 ml 16.70 ml/m  AORTIC VALVE                   PULMONIC VALVE AV Vmax:           147.00 cm/s PV Vmax:       1.21 m/s AV Vmean:          96.600 cm/s PV Peak grad:  5.9 mmHg AV VTI:            0.168 m AV Peak Grad:      8.6 mmHg AV Mean Grad:      4.0 mmHg LVOT Vmax:         85.50 cm/s LVOT Vmean:  53.100 cm/s LVOT VTI:          0.097 m LVOT/AV VTI ratio: 0.58  AORTA Ao Root diam: 3.00 cm Ao Asc diam:  2.90 cm  SHUNTS Systemic VTI: 0.10 m Sanda Klein MD Electronically signed by Sanda Klein MD Signature Date/Time: 04/14/2021/1:18:01 PM    Final    VAS [place] UPPER EXTREMITY VENOUS DUPLEX  Result Date: 04/20/2021 UPPER VENOUS STUDY  Patient Name:  Mikayla Rios  Date of Exam:   04/19/2021 Medical Rec #: [id number]       Accession #:    [id number] Date of Birth: [birth year]        Patient Gender: F Patient Age:   50 years Exam Location:  [facility] Procedure:      VAS [place] UPPER EXTREMITY VENOUS DUPLEX Referring Phys: Jolaine Artist MEMON --------------------------------------------------------------------------------  Indications: Edema Limitations: Poor ultrasound/tissue interface and body habitus. Comparison Study: No prior study Performing Technologist: Maudry Mayhew MHA, RDMS, RVT, RDCS  Examination Guidelines: A complete evaluation includes B-mode imaging, spectral Doppler, color Doppler, and power Doppler as needed of all accessible portions of each vessel. Bilateral testing is considered an integral part of a complete examination. Limited examinations for reoccurring indications may be performed as noted.  Right Findings:  +----------+------------+---------+-----------+----------+-------+ RIGHT     CompressiblePhasicitySpontaneousPropertiesSummary +----------+------------+---------+-----------+----------+-------+ Subclavian               Yes       Yes                      +----------+------------+---------+-----------+----------+-------+  Left Findings: +----------+------------+---------+-----------+----------+--------------+ LEFT      CompressiblePhasicitySpontaneousProperties   Summary     +----------+------------+---------+-----------+----------+--------------+ IJV           Full       Yes       Yes                             +----------+------------+---------+-----------+----------+--------------+ Subclavian    Full       Yes       Yes                             +----------+------------+---------+-----------+----------+--------------+ Axillary      Full       Yes       Yes                             +----------+------------+---------+-----------+----------+--------------+ Brachial      Full       Yes       Yes                             +----------+------------+---------+-----------+----------+--------------+ Radial        Full                                                 +----------+------------+---------+-----------+----------+--------------+ Ulnar         Full                                                 +----------+------------+---------+-----------+----------+--------------+  Cephalic      Full                                                 +----------+------------+---------+-----------+----------+--------------+ Basilic                                             Not visualized +----------+------------+---------+-----------+----------+--------------+  Summary:  Right: No evidence of thrombosis in the subclavian.  Left: No evidence of deep vein thrombosis in the upper extremity. No evidence of superficial vein thrombosis involving  visualized veins of the left upper extremity.  *See table(s) above for measurements and observations.  Diagnosing physician: Monica Martinez MD Electronically signed by Monica Martinez MD on 04/20/2021 at 11:59:48 AM.    Final    [place] EKG Site Rite  Result Date: 04/17/2021 If Site Rite image not attached, placement could not be confirmed due to current cardiac rhythm.  [place] EKG SITE RITE  Result Date: 04/12/2021 If Site Rite image not attached, placement could not be confirmed due to current cardiac rhythm.   Assessment and Plan:  1.  Stage pT4  pN0 colon adenocarcinoma 2.  Complicated diverticulitis with abscess formation and drain placement in June 2022 3.  Anemia 4.  Leukocytosis 5.  Thrombocytosis 6.  Hypertension 7.  Sinus tachycardia 8.  Obstructive sleep apnea 9.  Family history of colon cancer  -Discussed the biopsy results with the patient and her husband.  We discussed that she will need to heal from her surgery.  She is planning to go to inpatient rehab following hospitalization.  Once she has recovered, we will consider her for systemic chemotherapy. -Recommend PRBC transfusion for hemoglobin less than 7.5. -Leukocytosis and thrombocytosis are likely reactive.  Monitor for now. -Noted to have a family history of colon cancer.  May consider her for genetics counseling as an outpatient. -Management of chronic medical conditions per hospitalist.   Thank you for this referral.   Mikey Bussing, DNP, AGPCNP-BC, AOCNP   Addendum  I have seen the patient, examined her. I agree with the assessment and and plan and have edited the notes.   35 yo female with recently diagnosed pT4N0 stage II sigmoid colon cancer with perforation and abscess. She required urgent surgical resection, with ostomy in a draining tube.  She is recovering from surgery, likely will go to [facility] for inpatient rehabilitation.  Her preop CEA was slightly elevated. Her tumor invaded small bowel, there  are only 4 nodes in surgical sample. No LVI or perineural invasion.  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  She has strong family history of colon cancer, her mother is also my patient with metastatic colon cancer.  We will definitely refer her to genetics for testing.  I discussed the overall high risk for recurrence, due to the T4 lesion and perforation.  When she recovers well, I recommend adjuvant chemotherapy FOLFOX or Capox for 3-6 months. I would like her to start in 4-8 weeks post-op,if she recovers well and surgical wound heals well.  I plan to see her back in my clinic after she discharged from rehab.  All questions were answered.   [name]  04/24/2021

## 2021-04-24 NOTE — Progress Notes (Signed)
PROGRESS NOTE    Mikayla Rios  IWL:[id number] DOB: [birth year] DOA: 04/10/2021 PCP: [name], MD   Brief Narrative:  The patient is a 35 year old obese [name] female with a past medical history significant for [place] to hypertension, inappropriate sinus tachycardia, obesity, obstructive sleep apnea, history of diverticulosis with complicated diverticulitis, history of abscess formation on 622 22 status post IR guided placement to left lower quadrant drains who presented to [facility] long ED with complaints of generalized weakness and lightheadedness.  She is found to have any abscess measuring about 8.5 x 10 cm extending into the left lateral abdominal wall.  She underwent drain exchange and upsizing by IR on Monday and then subsequently underwent a colectomy and colostomy with small bowel resection and retroperitoneal abscess drainage on 04/15/2021.  Postoperatively she was monitored in the ICU the vent and briefly required pressors.  She is extubated 04/16/2021 and has since been weaned off of pressors.  She was transferred back to [facility] on 04/18/2021 CIR is evaluating her for placement.  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  Medical oncology has been consulted and they feel that she would be benefit and from post adjuvant chemotherapy and perhaps focal radiation therapy.  She is will be discussed at the tumor board.  There will be consideration of Port-A-Cath placement while she is here and general surgery feels that they are unlikely attempt colostomy takedown and anastomosis for least a year or so until there is no local recurrence or metastatic disease.  She continues to be tachycardic but states that her heart rate has always been over 100.  Initial blood count dropped she is typed and screened and transfused 1 unit of PRBCs.  Her diet has been advanced to soft diet and will obtain a calorie count to evaluate how much she is eating as she is also being continued on TPN for  the time being  Assessment & Plan:   Principal Problem:   Diverticulitis of large intestine with abscess Active Problems:   Class 3 severe obesity due to excess calories with serious comorbidity and body mass index (BMI) of 60.0 to 69.9 in adult [facility])   Obstructive sleep apnea   Anemia   Inappropriate sinus tachycardia   Essential hypertension   SIRS (systemic inflammatory response syndrome) (HCC)   Hypokalemia, inadequate intake   Protein-calorie malnutrition, severe (HCC)   Reactive thrombocytosis   Prolonged QT interval  Complicated diverticulitis with abscess s/p drain placement by IR twice in June 2022 with numerous exchanges since then due to persistent fistulous -CT of the abdomen and pelvis New lobulated enhancing fluid collection posterior to this segment of inflamed colon measuring 8.5 x 3.5 x 10.0 cm. This collection now invades the adjacent iliopsoas muscle as well as extends through the left lateral abdominal wall. -Patient has had prolonged treatment with antibiotics and multiple drain placements, but despite conservative measures, she has failed to improve and now has a new abscess -Seen by general surgery and she underwent colectomy/colostomy and drainage of retroperitoneal abscess -She is completed a course of IV antibiotics -Has been removed and diet is being advanced per general surgery -She has wound VAC over abdominal wound -Defer further postop care to surgery and they had a long discussion with the patient about her adenocarcinoma -Diet has been advanced to a soft diet and surgeries continue TPN for now -We will order a calorie count to evaluate, so patient is actually taking in and today is Day 2 of the Calorie Count  Septic shock -Secondary to retroperitoneal abscess -Patient required vasopressor therapy postoperatively, but has been able to wean off pressors -Blood pressures now elevated -WBC has trended down and went from 13.4 -> 11.3 -> 11.6 -> 11.5 but  is now trending up and went to 12.7 -She has completed antibiotics   Acute respiratory failure with hypoxia requiring mechanical ventilation -Patient was intubated for operative management and due to hemodynamic instability postoperatively, decision was made to leave the patient intubated and monitored in the ICU -She was able to extubate on 11/10 and respiratory status has been stable   Adenocarcinoma colon -New diagnosis; 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  -Medical oncology consulted and they discussed the biopsy results with her and they feel that she will need to heal from her surgery.  She is planning going to CIR and when she is recovered we will consider systemic chemotherapy for her and may consider getting a genetics counseling as an outpatient   Mechanical Fall:  -Patient reports dizziness and an episode of syncope and an episode of mechanical fall possibly from orthostatic hypotension.  -Monitor on telemetry. -CT angiogram of the chest ruled out PE -EKG shows prolonged QT interval 636.  Nonspecific T wave inversions.troponin is negative. Patient denies any chest pain or shortness of breath. -Probably secondary to orthostatic hypotension from infection. -Overall QTC has improved   Hypokalemia Hypophosphatemia -Currently being replaced with TPN protocol -Continue to monitor and replete as necessary   Prolonged QTc: -From hypokalemia and hypomagnesemia. -K is 4.9 today. magnesium is 1.9. -Keep k >4 and mag>2.  -Repeat EKG shows improvement in the qtC.     Anemia of chronic disease/ Acute anemia of acute illness -Normocytic.  -Baseline creatinine appears to be between 7-8 -Patient noted to have significant bleeding in ostomy -She was  transfused a total of 7 units prbc thus far and will be receiving another unit today to make it 8 -Hgb/hematocrit dropped again and went from 7.3/22.9 -> 6.9/20.7; she is typed and screened and transfused 1 unit PRBCs yesterday and repeat hemoglobin today is now 7.5/23.7 -Continue to follow CBC -Transfuse to keep hemoglobin greater than 7.   Post op bleeding from ostomy -concern it may be bleeding at small bowel anastomosis -defer further intervention if needed to general surgery -She received 2 units of FFP on 11/13 -Overall bleeding seems to be improving -continue to follow hemoglobin and supportive care with PRBC transfusions -See above   Anasarca -related to hypoalbuminemia and aggressive volume repletion as well as blood transfusions -Received albumin infusion and IV lasix 20 mg twice daily with good urine output -Swelling appears to be improving however upon further review she was +16.449 L since admission yesterday so we increased Lasix to 40 mg twice daily and continue; today she has +15.105 L since admission   Hypoalbuminemia  -will get dietary consult.  -currently on TPN was n.p.o. but diet was advanced to soft.  We will obtain a calorie count and this is day 2 of   Sinus tachycardia  -Probably from anemia and infection and pain.  -Pt not symptomatic.  Heart rate is ranging from 110s to 140s -Echocardiogram unremarkable -Continue on IV Lopressor and was transitioned to 12.5 mg p.o. twice daily and will go to 25 mg p.o. twice daily -She  does have a history of inappropriate sinus tachycardia and has been worked up by cardiology in outpatient setting by Dr. Gardiner Rhyme -Continue monitor heart rates clinically   Hypertension  Currently on IV lopressor and prn hydralazine -Restart on oral Lopressor once able and was resumed on 12.5 mg and will change to 25 mg twice daily -Continue to monitor blood pressures per protocol -Last blood pressure reading was  126/89  Thrombocytosis -Patient's platelet count went from 421 and trended up to 433 and is now 538 -Continue to monitor and trend and repeat CBC in a.m.   DVT prophylaxis: SCDs Code Status: Full code Family Communication: No family currently at bedside discussed with family at bedside Disposition Plan: Pending further clinical improvement  Status is: Inpatient  Remains inpatient appropriate because: She is not yet back to her baseline and will need to tolerate her diet and be off the TPN for safe discharge disposition as she will need to also go to CIR  Consultants:  PCCM transfer General surgery Medical oncology Cardiology Gastroenterology  Procedures:  Laparoscopic left colectomy/end colostomy, small bowel resection, left retroperitoneal/flank abscess drainage   ETT 11/09>11/10  LE Venous Duplex   Antimicrobials:  Anti-infectives (From admission, onward)    Start     Dose/Rate Route Frequency Ordered Stop   04/17/21 1700  ceFEPIme (MAXIPIME) 2 g in sodium chloride 0.9 % 100 mL IVPB  Status:  Discontinued        2 g 200 mL/hr over 30 Minutes Intravenous Every 8 hours 04/17/21 1530 04/21/21 0808   04/17/21 1700  metroNIDAZOLE (FLAGYL) IVPB 500 mg  Status:  Discontinued        500 mg 100 mL/hr over 60 Minutes Intravenous Every 12 hours 04/17/21 1530 04/21/21 0808   04/15/21 1000  anidulafungin (ERAXIS) 100 mg in sodium chloride 0.9 % 100 mL IVPB  Status:  Discontinued        100 mg 78 mL/hr over 100 Minutes Intravenous Every 24 hours 04/14/21 0744 04/21/21 0808   04/15/21 0900  clindamycin (CLEOCIN) IVPB 900 mg       See Hyperspace for full Linked Orders Report.   900 mg 100 mL/hr over 30 Minutes Intravenous 60 min pre-op 04/14/21 1405 04/15/21 1142   04/15/21 0900  gentamicin (GARAMYCIN) 340 mg in dextrose 5 % 100 mL IVPB       See Hyperspace for full Linked Orders Report.   5 mg/kg  67.7 kg (Adjusted) 108.5 mL/hr over 60 Minutes Intravenous 60 min pre-op 04/14/21  1405 04/15/21 1834   04/14/21 1000  anidulafungin (ERAXIS) 200 mg in sodium chloride 0.9 % 200 mL IVPB        200 mg 78 mL/hr over 200 Minutes Intravenous  Once 04/14/21 0755 04/14/21 1500   04/13/21 0900  meropenem (MERREM) 1 g in sodium chloride 0.9 % 100 mL IVPB  Status:  Discontinued        1 g 200 mL/hr over 30 Minutes Intravenous Every 8 hours 04/13/21 0800 04/17/21 1530   04/10/21 2200  cefTRIAXone (ROCEPHIN) 2 g in sodium chloride 0.9 % 100 mL IVPB  Status:  Discontinued        2 g 200 mL/hr over 30 Minutes Intravenous Daily at bedtime 04/10/21 2044 04/13/21 0746   04/10/21 2200  metroNIDAZOLE (FLAGYL) IVPB 500 mg  Status:  Discontinued        500 mg 100 mL/hr over 60 Minutes Intravenous Every 12 hours 04/10/21 2044 04/13/21 0745  Subjective: Seen and examined and she states that she is feeling good today and tried to eat some soft food last night.  Continues to be on TPN.  No lightheadedness or dizziness.  Blood count is a little increased from yesterday but not significantly much.  She denies any concerns or complaints at this time  Objective: Vitals:   04/24/21 1500 04/24/21 1600 04/24/21 1645 04/24/21 1700  BP: 116/78 119/78  126/89  Pulse: (!) 126 (!) 119  (!) 132  Resp: 18 (!) 22  (!) 25  Temp:   99.1 F (37.3 C)   TempSrc:   Oral   SpO2: 100% 94%  99%  Weight:      Height:        Intake/Output Summary (Last 24 hours) at 04/24/2021 1901 Last data filed at 04/24/2021 1800 Gross per 24 hour  Intake 2269.75 ml  Output 3610 ml  Net -1340.25 ml    Filed Weights   04/15/21 1800 04/23/21 1752 04/24/21 0700  Weight: 99.5 kg 100.5 kg 102.4 kg   Examination: Physical Exam:  Constitutional: WN/WD morbidly obese African-American female currently in no acute distress appears calm  Eyes: Lids and conjunctivae normal, sclerae anicteric  ENMT: External Ears, Nose appear normal. Grossly normal hearing. Mucous membranes are moist.  Neck: Appears normal, supple,  no cervical masses, normal ROM, no appreciable thyromegaly; no appreciable JVD Respiratory: Diminished to auscultation bilaterally, no wheezing, rales, rhonchi or crackles. Normal respiratory effort and patient is not tachypenic. No accessory muscle use.  Unlabored breathing Cardiovascular: Tachycardic rate with rates in the 120s to 130s., no murmurs / rubs / gallops. S1 and S2 auscultated.  Has 1+ lower extremity edema Abdomen: Soft, mildly-tender, distended secondary body habitus.  Has right-sided abdominal drain in place as well as of colostomy with air and stool in it as well as a right sided wound VAC.  No masses palpated. No appreciable hepatosplenomegaly. Bowel sounds positive x4.  GU: Deferred. Musculoskeletal: No clubbing / cyanosis of digits/nails. Normal strength and muscle tone.  Skin: No rashes, lesions, ulcers on a limited skin evaluation. No induration; Warm and dry.  Neurologic: CN 2-12 grossly intact with no focal deficits. Romberg sign and cerebellar reflexes not assessed.  Psychiatric: Normal judgment and insight. Alert and oriented x 3. Normal mood and appropriate affect.   Data Reviewed: I have personally reviewed following labs and imaging studies  CBC: Recent Labs  Lab 04/20/21 1847 04/21/21 0500 04/22/21 0418 04/23/21 0522 04/24/21 0500  WBC 13.4* 11.3* 11.6* 11.5* 12.7*  NEUTROABS  --   --   --  9.2* 9.4*  HGB 7.7* 7.2* 7.3* 6.9* 7.5*  HCT 23.7* 21.8* 22.9* 21.7* 23.7*  MCV 90.8 95.6 90.5 91.6 91.2  PLT 361 349 421* 433* 538*    Basic Metabolic Panel: Recent Labs  Lab 04/21/21 0500 04/21/21 0600 04/22/21 0418 04/23/21 0522 04/24/21 0500  NA 131* 139 136 136 135  K 2.9* 2.8* 3.3* 4.7 4.9  CL 99 107 102 100 99  CO2 26 26 29 30 31   GLUCOSE 1,172* 94 107* 100* 92  BUN 22* 21* 25* 27* 22*  CREATININE 0.53 0.35* 0.31* <0.30* <0.30*  CALCIUM 8.5* 7.5* 8.4* 8.2* 8.3*  MG 2.2 1.3* 1.9 2.0 1.9  PHOS 2.5 4.1 1.4* 3.8 4.6    GFR: CrCl cannot be calculated  (This lab value cannot be used to calculate CrCl because it is not a number: <0.30). Liver Function Tests: Recent Labs  Lab 04/19/21 0214 04/20/21 0530  04/22/21 0418 04/23/21 0522 04/24/21 0500  AST 10* 10* 12* 15 11*  ALT 9 9 9 10 10   ALKPHOS 47 43 63 68 71  BILITOT 0.3 0.5 0.4 0.5 0.5  PROT 4.0* 4.9* 5.2* 5.3* 5.5*  ALBUMIN 1.5* 2.7* 2.4* 2.2* 2.2*    No results for input(s): LIPASE, AMYLASE in the last 168 hours. No results for input(s): AMMONIA in the last 168 hours. Coagulation Profile: Recent Labs  Lab 04/19/21 1500  INR 1.2    Cardiac Enzymes: No results for input(s): CKTOTAL, CKMB, CKMBINDEX, TROPONINI in the last 168 hours. BNP (last 3 results) No results for input(s): PROBNP in the last 8760 hours. HbA1C: No results for input(s): HGBA1C in the last 72 hours. CBG: Recent Labs  Lab 04/23/21 1802 04/23/21 2315 04/24/21 0624 04/24/21 1209 04/24/21 1738  GLUCAP 116* 118* 106* 106* 118*    Lipid Profile: No results for input(s): CHOL, HDL, LDLCALC, TRIG, CHOLHDL, LDLDIRECT in the last 72 hours.  Thyroid Function Tests: No results for input(s): TSH, T4TOTAL, FREET4, T3FREE, THYROIDAB in the last 72 hours. Anemia Panel: No results for input(s): VITAMINB12, FOLATE, FERRITIN, TIBC, IRON, RETICCTPCT in the last 72 hours.  Sepsis Labs: Recent Labs  Lab 04/20/21 0052 04/20/21 0335  LATICACIDVEN 1.1 1.0     No results found for this or any previous visit (from the past 240 hour(s)).   RN Pressure Injury Documentation:     Estimated body mass index is 39.99 kg/m as calculated from the following:   Height as of this encounter: 5\' 3"  (1.6 m).   Weight as of this encounter: 102.4 kg.  Malnutrition Type:  Nutrition Problem: Inadequate oral intake Etiology: acute illness, inability to eat  Malnutrition Characteristics:  Signs/Symptoms: per patient/family report, NPO status  Nutrition Interventions:  Interventions: Ensure Enlive (each supplement  provides 350kcal and 20 grams of protein), TPN   Radiology Studies: No results found.  Scheduled Meds:  acetaminophen  1,000 mg Oral Q6H   chlorhexidine  15 mL Mouth/Throat BID   Chlorhexidine Gluconate Cloth  6 each Topical Daily   diclofenac Sodium  4 g Topical QID   feeding supplement  237 mL Oral BID BM   furosemide  40 mg Intravenous BID   insulin aspart  0-9 Units Subcutaneous Q6H   lip balm  1 application Topical BID   metoprolol tartrate  25 mg Oral BID   pantoprazole (PROTONIX) IV  40 mg Intravenous Daily   Continuous Infusions:  sodium chloride Stopped (04/19/21 1232)   methocarbamol (ROBAXIN) IV     TPN ADULT (ION) 90 mL/hr at 04/24/21 1800    LOS: 14 days   Kerney Elbe, DO [facility] PAGER is on AMION  If 7PM-7AM, please contact night-coverage [email or web address]

## 2021-04-25 DIAGNOSIS — K5720 Diverticulitis of large intestine with perforation and abscess without bleeding: Secondary | ICD-10-CM | POA: Diagnosis not present

## 2021-04-25 DIAGNOSIS — D649 Anemia, unspecified: Secondary | ICD-10-CM | POA: Diagnosis not present

## 2021-04-25 DIAGNOSIS — I10 Essential (primary) hypertension: Secondary | ICD-10-CM | POA: Diagnosis not present

## 2021-04-25 LAB — CBC WITH DIFFERENTIAL/PLATELET
Abs Immature Granulocytes: 0.34 10*3/uL — ABNORMAL HIGH (ref 0.00–0.07)
Basophils Absolute: 0 10*3/uL (ref 0.0–0.1)
Basophils Relative: 0 %
Eosinophils Absolute: 0.1 10*3/uL (ref 0.0–0.5)
Eosinophils Relative: 1 %
HCT: 20.8 % — ABNORMAL LOW (ref 36.0–46.0)
Hemoglobin: 6.5 g/dL — CL (ref 12.0–15.0)
Immature Granulocytes: 2 %
Lymphocytes Relative: 14 %
Lymphs Abs: 1.9 10*3/uL (ref 0.7–4.0)
MCH: 29.3 pg (ref 26.0–34.0)
MCHC: 31.3 g/dL (ref 30.0–36.0)
MCV: 93.7 fL (ref 80.0–100.0)
Monocytes Absolute: 0.9 10*3/uL (ref 0.1–1.0)
Monocytes Relative: 6 %
Neutro Abs: 10.9 10*3/uL — ABNORMAL HIGH (ref 1.7–7.7)
Neutrophils Relative %: 77 %
Platelets: 709 10*3/uL — ABNORMAL HIGH (ref 150–400)
RBC: 2.22 MIL/uL — ABNORMAL LOW (ref 3.87–5.11)
RDW: 15.9 % — ABNORMAL HIGH (ref 11.5–15.5)
WBC: 14.2 10*3/uL — ABNORMAL HIGH (ref 4.0–10.5)
nRBC: 0.3 % — ABNORMAL HIGH (ref 0.0–0.2)

## 2021-04-25 LAB — PREPARE RBC (CROSSMATCH)

## 2021-04-25 LAB — COMPREHENSIVE METABOLIC PANEL
ALT: 10 U/L (ref 0–44)
AST: 12 U/L — ABNORMAL LOW (ref 15–41)
Albumin: 2.3 g/dL — ABNORMAL LOW (ref 3.5–5.0)
Alkaline Phosphatase: 108 U/L (ref 38–126)
Anion gap: 6 (ref 5–15)
BUN: 26 mg/dL — ABNORMAL HIGH (ref 6–20)
CO2: 31 mmol/L (ref 22–32)
Calcium: 8.5 mg/dL — ABNORMAL LOW (ref 8.9–10.3)
Chloride: 99 mmol/L (ref 98–111)
Creatinine, Ser: 0.35 mg/dL — ABNORMAL LOW (ref 0.44–1.00)
GFR, Estimated: >60 mL/min (ref >60)
Glucose, Bld: 106 mg/dL — ABNORMAL HIGH (ref 70–99)
Potassium: 4.7 mmol/L (ref 3.5–5.1)
Sodium: 136 mmol/L (ref 135–145)
Total Bilirubin: 0.3 mg/dL (ref 0.3–1.2)
Total Protein: 5.8 g/dL — ABNORMAL LOW (ref 6.5–8.1)

## 2021-04-25 LAB — GLUCOSE, CAPILLARY
Glucose-Capillary: 95 mg/dL (ref 70–99)
Glucose-Capillary: 94 mg/dL (ref 70–99)
Glucose-Capillary: 101 mg/dL — ABNORMAL HIGH (ref 70–99)
Glucose-Capillary: 97 mg/dL (ref 70–99)

## 2021-04-25 LAB — PHOSPHORUS: Phosphorus: 5.3 mg/dL — ABNORMAL HIGH (ref 2.5–4.6)

## 2021-04-25 LAB — MAGNESIUM: Magnesium: 2.1 mg/dL (ref 1.7–2.4)

## 2021-04-25 MED ORDER — PANTOPRAZOLE SODIUM 40 MG PO TBEC
40.0000 mg | DELAYED_RELEASE_TABLET | Freq: Every day | ORAL | Status: DC
  Administered 2021-04-26 – 2021-05-01 (×6): 40 mg via ORAL
  Filled 2021-04-25 (×6): qty 1

## 2021-04-25 MED ORDER — FUROSEMIDE 10 MG/ML IJ SOLN
20.0000 mg | Freq: Once | INTRAMUSCULAR | Status: DC
  Filled 2021-04-25: qty 2

## 2021-04-25 MED ORDER — SODIUM CHLORIDE 0.9% IV SOLUTION: Freq: Once | INTRAVENOUS | Status: AC

## 2021-04-25 MED ORDER — METOPROLOL TARTRATE 5 MG/5ML IV SOLN
5.0000 mg | INTRAVENOUS | Status: AC | PRN
  Administered 2021-04-25 (×2): 5 mg via INTRAVENOUS
  Filled 2021-04-25 (×2): qty 5

## 2021-04-25 NOTE — Plan of Care (Signed)
  Problem: Clinical Measurements: Goal: Ability to maintain clinical measurements within normal limits will improve Outcome: Not Progressing Goal: Cardiovascular complication will be avoided Outcome: Not Progressing   Problem: Activity: Goal: Risk for activity intolerance will decrease Outcome: Not Progressing   Problem: Pain Managment: Goal: General experience of comfort will improve Outcome: Not Progressing   Problem: Education: Goal: Knowledge of General Education information will improve Description: Including pain rating scale, medication(s)/side effects and non-pharmacologic comfort measures Outcome: Progressing   Problem: Health Behavior/Discharge Planning: Goal: Ability to manage health-related needs will improve Outcome: Progressing   Problem: Clinical Measurements: Goal: Will remain free from infection Outcome: Progressing Goal: Diagnostic test results will improve Outcome: Progressing Goal: Respiratory complications will improve Outcome: Progressing   Problem: Nutrition: Goal: Adequate nutrition will be maintained Outcome: Progressing   Problem: Coping: Goal: Level of anxiety will decrease Outcome: Progressing   Problem: Elimination: Goal: Will not experience complications related to bowel motility Outcome: Progressing   Problem: Safety: Goal: Ability to remain free from injury will improve Outcome: Progressing   Problem: Skin Integrity: Goal: Risk for impaired skin integrity will decrease Outcome: Progressing

## 2021-04-25 NOTE — Progress Notes (Signed)
10 Days Post-Op   Subjective/Chief Complaint: Doing fine, tol diet, appetite not normal, walked yesterday   Objective: Vital signs in last 24 hours: Temp:  [98.5 F (36.9 C)-99.1 F (37.3 C)] 98.8 F (37.1 C) (11/19 0828) Pulse Rate:  [112-145] 115 (11/19 0500) Resp:  [18-31] 18 (11/19 0500) BP: (109-134)/(70-92) 119/70 (11/19 0500) SpO2:  [94 %-100 %] 99 % (11/19 0500) Last BM Date: 04/23/21  Intake/Output from previous day: 11/18 0701 - 11/19 0700 In: 1986.1 [I.V.:1939.3; IV Piggyback:46.9] Out: 3710 [Urine:4100; Drains:45; Stool:50] Intake/Output this shift: No intake/output data recorded.  Vac in place functional, stoma pink and functional, jp serous  Lab Results:  Recent Labs    04/23/21 0522 04/24/21 0500  WBC 11.5* 12.7*  HGB 6.9* 7.5*  HCT 21.7* 23.7*  PLT 433* 538*   BMET Recent Labs    04/24/21 0500 04/25/21 0530  NA 135 136  K 4.9 4.7  CL 99 99  CO2 31 31  GLUCOSE 92 106*  BUN 22* 26*  CREATININE <0.30* 0.35*  CALCIUM 8.3* 8.5*   PT/INR No results for input(s): LABPROT, INR in the last 72 hours. ABG No results for input(s): PHART, HCO3 in the last 72 hours.  Invalid input(s): PCO2, PO2  Studies/Results: No results found.  Anti-infectives: Anti-infectives (From admission, onward)    Start     Dose/Rate Route Frequency Ordered Stop   04/17/21 1700  ceFEPIme (MAXIPIME) 2 g in sodium chloride 0.9 % 100 mL IVPB  Status:  Discontinued        2 g 200 mL/hr over 30 Minutes Intravenous Every 8 hours 04/17/21 1530 04/21/21 0808   04/17/21 1700  metroNIDAZOLE (FLAGYL) IVPB 500 mg  Status:  Discontinued        500 mg 100 mL/hr over 60 Minutes Intravenous Every 12 hours 04/17/21 1530 04/21/21 0808   04/15/21 1000  anidulafungin (ERAXIS) 100 mg in sodium chloride 0.9 % 100 mL IVPB  Status:  Discontinued        100 mg 78 mL/hr over 100 Minutes Intravenous Every 24 hours 04/14/21 0744 04/21/21 0808   04/15/21 0900  clindamycin (CLEOCIN) IVPB 900 mg        See Hyperspace for full Linked Orders Report.   900 mg 100 mL/hr over 30 Minutes Intravenous 60 min pre-op 04/14/21 1405 04/15/21 1142   04/15/21 0900  gentamicin (GARAMYCIN) 340 mg in dextrose 5 % 100 mL IVPB       See Hyperspace for full Linked Orders Report.   5 mg/kg  67.7 kg (Adjusted) 108.5 mL/hr over 60 Minutes Intravenous 60 min pre-op 04/14/21 1405 04/15/21 1834   04/14/21 1000  anidulafungin (ERAXIS) 200 mg in sodium chloride 0.9 % 200 mL IVPB        200 mg 78 mL/hr over 200 Minutes Intravenous  Once 04/14/21 0755 04/14/21 1500   04/13/21 0900  meropenem (MERREM) 1 g in sodium chloride 0.9 % 100 mL IVPB  Status:  Discontinued        1 g 200 mL/hr over 30 Minutes Intravenous Every 8 hours 04/13/21 0800 04/17/21 1530   04/10/21 2200  cefTRIAXone (ROCEPHIN) 2 g in sodium chloride 0.9 % 100 mL IVPB  Status:  Discontinued        2 g 200 mL/hr over 30 Minutes Intravenous Daily at bedtime 04/10/21 2044 04/13/21 0746   04/10/21 2200  metroNIDAZOLE (FLAGYL) IVPB 500 mg  Status:  Discontinued        500 mg 100 mL/hr over  60 Minutes Intravenous Every 12 hours 04/10/21 2044 04/13/21 0745       Assessment/Plan: POD#10 - S/P LAPAROSCOPIC ASSISTED HARTMANN  (LEFT COLECTOMY / END COLOSTOMY), SMALL BOWEL RESECTION, LEFT RETROPERITONEAL / FLANK ABSCESS DRAINAGE 04/15/21 Dr. [name] soft diet with supplements            stop TPN              Wound care - VAC changes             Right sided drain in place- remove prior to discharge or CIR Colonic adenocarcinoma             Oncology aware  Anemia             Hgb 7.5 yesterday Disposition             OT/PT working with patient             Likely to CIR when medically stable    Rolm Bookbinder 04/25/2021

## 2021-04-25 NOTE — Progress Notes (Signed)
Occupational Therapy Treatment Patient Details Name: Mikayla Rios MRN: [id number] DOB: [birth year] Today's Date: 04/25/2021   History of present illness Mikayla Rios is a 35 yo female presents with complaints of generalized weakness and lightheadedness. PMH: HTN, sinus tachny, obesity, obstructive sleep apnea and diverticulosis with complicated diverticulitis and abscess formation 11/2020 status post IR guided placement of 2 left lower quadrant drains.  S/PP LAPAROSCOPIC ASSISTED HARTMANN  (LEFT COLECTOMY / END COLOSTOMY), SMALL BOWEL RESECTION, LEFT RETROPERITONEAL / [place] ABSCESS DRAINAGE 04/15/21   OT comments  Treatment focused on strengthening and functional mobility. Patient's HR 137 with patient in supine and before activity. Therapist requested parameters for HR and activity. Per MD via Epic chat "think if it goes above 150 she will need to rest but take it slowly." HR maintained approximately in the 130s with exercise in bed. Patient performed 20 min UE exercise in bed. With transfer to side of bed patient's HR up to 146. Performed 3 sit to stands with +2 min assist (mod assist initially) with increased time between stands to allow HR to low. UP to 156 with stands. With transfer to recliner HR 166 and recovered to 146. Patient reports abdominal pain/muscle pain with activity today. Patient continues to be highly motivated and defers pain medicine to work with therapy so she doesn't get dizzy or sleepy. Continue to recommend inpatient rehab at discharge.   Recommendations for follow up therapy are one component of a multi-disciplinary discharge planning process, led by the attending physician.  Recommendations may be updated based on patient status, additional functional criteria and insurance authorization.    Follow Up Recommendations  Acute inpatient rehab (3hours/day)    Assistance Recommended at Discharge Frequent or constant Supervision/Assistance  Equipment Recommendations  BSC/3in1     Recommendations for Other Services      Precautions / Restrictions Precautions Precautions: Fall Precaution Comments: monitor HR, colostomy, R drain, VAC Restrictions Weight Bearing Restrictions: No       Mobility Bed Mobility Overal bed mobility: Needs Assistance Bed Mobility: Supine to Sit Rolling: Mod assist Sidelying to sit: Mod assist;HOB elevated       General bed mobility comments: Improved ability to manuever legs to side of the bed but still needing some assistance to get in appropriate position. Mod assist for trunk negotiation.    Transfers Overall transfer level: Needs assistance Equipment used: Rolling walker (2 wheels) Transfers: Sit to/from Stand Sit to Stand: From elevated surface;Min assist;+2 physical assistance Stand pivot transfers: Min guard;+2 safety/equipment         General transfer comment: Performed 3 sit to stand sat edge of bed with initial mod assist to power up (+2) then to min assist. Need elevated bed height. Required rest breaks between stands to get HR below 150. Min guard +2 for safety to transfer to recliner. HR up to 166.     Balance Overall balance assessment: Needs assistance Sitting-balance support: No upper extremity supported Sitting balance-Leahy Scale: Fair     Standing balance support: During functional activity Standing balance-Leahy Scale: Poor Standing balance comment: reliant on walker                           ADL either performed or assessed with clinical judgement   ADL  Extremity/Trunk Assessment              Vision Patient Visual Report: No change from baseline     Perception     Praxis      Cognition Arousal/Alertness: Awake/alert Behavior During Therapy: WFL for tasks assessed/performed Overall Cognitive Status: Within Functional Limits for tasks assessed                                             Exercises Other Exercises Other Exercises: Orange band: external rotation x 20 each arm, tricep exten 20 each arm, bicep curl 20 each arm (HR between 133-137) Other Exercises: AROM near supine: shoulder flexion 10 reps x each arm (HR 137), unilateral chest press x 10 reps each arm (HR 136-137)   Shoulder Instructions       General Comments      Pertinent Vitals/ Pain       Pain Assessment: Faces Faces Pain Scale: Hurts even more Pain Location: abdomen, flanks Pain Descriptors / Indicators: Grimacing;Guarding;Sharp Pain Intervention(s): Monitored during session  Home Living                                          Prior Functioning/Environment              Frequency  Min 2X/week        Progress Toward Goals  OT Goals(current goals can now be found in the care plan section)  Progress towards OT goals: Progressing toward goals  Acute Rehab OT Goals OT Goal Formulation: With patient Time For Goal Achievement: 04/26/21 Potential to Achieve Goals: Good  Plan Discharge plan remains appropriate    Co-evaluation          OT goals addressed during session: Strengthening/ROM (functional mobility)      AM-PAC OT "6 Clicks" Daily Activity     Outcome Measure   Help from another person eating meals?: A Little Help from another person taking care of personal grooming?: A Little Help from another person toileting, which includes using toliet, bedpan, or urinal?: Total Help from another person bathing (including washing, rinsing, drying)?: A Lot Help from another person to put on and taking off regular upper body clothing?: A Lot Help from another person to put on and taking off regular lower body clothing?: Total 6 Click Score: 12    End of Session Equipment Utilized During Treatment: Gait belt;Rolling walker (2 wheels)  OT Visit Diagnosis: Unsteadiness on feet (R26.81);Other abnormalities of gait and mobility (R26.89);History of falling  (Z91.81);Muscle weakness (generalized) (M62.81);Pain   Activity Tolerance Patient tolerated treatment well;Other (comment) (tachycardia)   Patient Left with call bell/phone within reach;in chair   Nurse Communication Mobility status        Time: 8921-1941 OT Time Calculation (min): 41 min  Charges: OT General Charges $OT Visit: 1 Visit OT Treatments $Therapeutic Activity: 23-37 mins $Therapeutic Exercise: 8-22 mins  Daisy Lites, OTR/L Acute Care Rehab Services  Office [phone] Pager: [place] 04/25/2021, 1:07 PM

## 2021-04-25 NOTE — Progress Notes (Signed)
PROGRESS NOTE    Mikayla Rios  SEG:[id number] DOB: 07-Aug-1985 DOA: 04/10/2021 PCP: [name], MD   Brief Narrative:  The patient is a 35 year old obese [name] female with a past medical history significant for [place] to hypertension, inappropriate sinus tachycardia, obesity, obstructive sleep apnea, history of diverticulosis with complicated diverticulitis, history of abscess formation on 622 22 status post IR guided placement to left lower quadrant drains who presented to [facility] long ED with complaints of generalized weakness and lightheadedness.  She is found to have any abscess measuring about 8.5 x 10 cm extending into the left lateral abdominal wall.  She underwent drain exchange and upsizing by IR on Monday and then subsequently underwent a colectomy and colostomy with small bowel resection and retroperitoneal abscess drainage on 04/15/2021.  Postoperatively she was monitored in the ICU the vent and briefly required pressors.  She is extubated 04/16/2021 and has since been weaned off of pressors.  She was transferred back to [facility] on 04/18/2021 CIR is evaluating her for placement.  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  Medical oncology has been consulted and they feel that she would be benefit and from post adjuvant chemotherapy and perhaps focal radiation therapy.  She is will be discussed at the tumor board.  There will be consideration of Port-A-Cath placement while she is here and general surgery feels that they are unlikely attempt colostomy takedown and anastomosis for least a year or so until there is no local recurrence or metastatic disease.  She continues to be tachycardic but states that her heart rate has always been over 100.  Blood count continues to drop so we will type and screen under 2 units today to make a total of 10 units to be transfused.  Her diet has been advanced to soft diet and will obtain a calorie count to evaluate how much she is eating as she  is also being continued on TPN for the time being  Assessment & Plan:   Principal Problem:   Diverticulitis of large intestine with abscess Active Problems:   Class 3 severe obesity due to excess calories with serious comorbidity and body mass index (BMI) of 60.0 to 69.9 in adult [facility])   Obstructive sleep apnea   Anemia   Inappropriate sinus tachycardia   Essential hypertension   SIRS (systemic inflammatory response syndrome) (HCC)   Hypokalemia, inadequate intake   Protein-calorie malnutrition, severe (HCC)   Reactive thrombocytosis   Prolonged QT interval   Malignant neoplasm of sigmoid colon ([place])  Complicated diverticulitis with abscess s/p drain placement by IR twice in June 2022 with numerous exchanges since then due to persistent fistulous -CT of the abdomen and pelvis New lobulated enhancing fluid collection posterior to this segment of inflamed colon measuring 8.5 x 3.5 x 10.0 cm. This collection now invades the adjacent iliopsoas muscle as well as extends through the left lateral abdominal wall. -Patient has had prolonged treatment with antibiotics and multiple drain placements, but despite conservative measures, she has failed to improve and now has a new abscess -Seen by general surgery and she underwent colectomy/colostomy and drainage of retroperitoneal abscess -She is completed a course of IV antibiotics -Has been removed and diet is being advanced per general surgery -She has wound VAC over abdominal wound -Defer further postop care to surgery and they had a long discussion with the patient about her adenocarcinoma -Diet has been advanced to a soft diet and surgeries continue TPN for now -We will order a calorie  count to evaluate, so patient is actually taking in and today is Day 2 of the Calorie Count and will await results   Septic shock -Secondary to retroperitoneal abscess -Patient required vasopressor therapy postoperatively, but has been able to wean off  pressors -Blood pressures now elevated -WBC has trended down and went from 13.4 -> 11.3 -> 11.6 -> 11.5 but is now trending up and went to 12.7 and is further elevated 14.2 -She has completed antibiotics   Acute respiratory failure with hypoxia requiring mechanical ventilation -Patient was intubated for operative management and due to hemodynamic instability postoperatively, decision was made to leave the patient intubated and monitored in the ICU -She was able to extubate on 11/10 and respiratory status has been stable -Continue with pulmonary toileting and continue monitor respiratory status carefully   Adenocarcinoma colon -New diagnosis; 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  -Medical oncology consulted and they discussed the biopsy results with her and they feel that she will need to heal from her surgery.  She is planning going to CIR and when she is recovered we will consider systemic chemotherapy for her and may consider getting a genetics counseling as an outpatient   Mechanical Fall:  -Patient reports dizziness and an episode of syncope and an episode of mechanical fall possibly from orthostatic hypotension.  -Monitor on telemetry. -CT angiogram of the chest ruled out PE -EKG shows prolonged QT interval 636.  Nonspecific T wave inversions.troponin is negative. Patient denies any chest pain or shortness of breath. -Probably secondary to orthostatic hypotension from infection. -Overall QTC has improved -PT OT recommending CIR   Hypokalemia Hypophosphatemia -Currently being replaced with TPN protocol -Calcium is 4.7 and mag level is 2.1 -Continue to monitor and replete as necessary   Prolonged QTc: -From hypokalemia and hypomagnesemia. -K is  4.9 today. magnesium is 1.9. -Keep k >4 and mag>2.  -Repeat EKG shows improvement in the qtC.     Anemia of chronic disease/ Acute anemia of acute illness -Normocytic.  -Baseline creatinine appears to be between 7-8 -Patient noted to have significant bleeding in ostomy -She was transfused a total of 8 units PRBCs thus far and will receive another 2 units today -Hgb/hematocrit dropped again and went from 7.3/22.9 -> 6.9/20.7 -> 7.5/23.7 in 10 days is again is 6.5/20.8; we will type and screen and transfuse 2 more units and continue Lasix 40 mg twice daily -Continue to follow CBC -Transfuse to keep hemoglobin greater than 7.   Post op bleeding from ostomy -concern it may be bleeding at small bowel anastomosis -defer further intervention if needed to general surgery -She received 2 units of FFP on 11/13 -Overall bleeding seems to be improving -continue to follow hemoglobin and supportive care with PRBC transfusions -See above   Anasarca -related to hypoalbuminemia and aggressive volume repletion as well as blood transfusions -Received albumin infusion and IV lasix 20 mg twice daily with good urine output -Swelling appears to be improving however upon further review she was +16.449 L since admission the day before yesterday so we increased Lasix to 40 mg twice daily and continue; today she has +11.267   Hypoalbuminemia  -will get dietary consult.  -currently on TPN was n.p.o. but diet was advanced to soft.  We will obtain a calorie count and  this is day 2 of Calorie Count is complete and awaiting resuls -Albumin Level was 2.3   Sinus Tachycardia  -Probably from anemia and infection and pain.  -Pt not symptomatic.  Heart rate is ranging from 110s to 140s still  -Echocardiogram unremarkable -Transfuse 2 units of pRBCs as above  -Continue on IV Lopressor and was transitioned to 12.5 mg p.o. twice daily and will go to 25 mg p.o. twice daily -She does have a history of inappropriate sinus  tachycardia and has been worked up by cardiology in outpatient setting by Dr. Gardiner Rhyme -Continue monitor heart rates clinically   Hypertension  Currently on IV lopressor and prn hydralazine -Restart on oral Lopressor 25 mg po BID -Continue to monitor blood pressures per protocol -Last blood pressure reading was 132/87  Thrombocytosis -Patient's platelet count went from 421 -> 433 -> 538 -> 709 -Continue to monitor and trend and repeat CBC in a.m.   DVT prophylaxis: SCDs Code Status: Full code Family Communication: No family currently at bedside discussed with family at bedside Disposition Plan: Pending further clinical improvement; Once blood count stablizes she can be D/C'd to CIR  Status is: Inpatient  Remains inpatient appropriate because: She is not yet back to her baseline and will need to tolerate her diet and be off the TPN for safe discharge disposition as she will need to also go to CIR  Consultants:  PCCM transfer General surgery Medical oncology Cardiology Gastroenterology  Procedures:  Laparoscopic left colectomy/end colostomy, small bowel resection, left retroperitoneal/flank abscess drainage   ETT 11/09>11/10  LE Venous Duplex   Antimicrobials:  Anti-infectives (From admission, onward)    Start     Dose/Rate Route Frequency Ordered Stop   04/17/21 1700  ceFEPIme (MAXIPIME) 2 g in sodium chloride 0.9 % 100 mL IVPB  Status:  Discontinued        2 g 200 mL/hr over 30 Minutes Intravenous Every 8 hours 04/17/21 1530 04/21/21 0808   04/17/21 1700  metroNIDAZOLE (FLAGYL) IVPB 500 mg  Status:  Discontinued        500 mg 100 mL/hr over 60 Minutes Intravenous Every 12 hours 04/17/21 1530 04/21/21 0808   04/15/21 1000  anidulafungin (ERAXIS) 100 mg in sodium chloride 0.9 % 100 mL IVPB  Status:  Discontinued        100 mg 78 mL/hr over 100 Minutes Intravenous Every 24 hours 04/14/21 0744 04/21/21 0808   04/15/21 0900  clindamycin (CLEOCIN) IVPB 900 mg       See  Hyperspace for full Linked Orders Report.   900 mg 100 mL/hr over 30 Minutes Intravenous 60 min pre-op 04/14/21 1405 04/15/21 1142   04/15/21 0900  gentamicin (GARAMYCIN) 340 mg in dextrose 5 % 100 mL IVPB       See Hyperspace for full Linked Orders Report.   5 mg/kg  67.7 kg (Adjusted) 108.5 mL/hr over 60 Minutes Intravenous 60 min pre-op 04/14/21 1405 04/15/21 1834   04/14/21 1000  anidulafungin (ERAXIS) 200 mg in sodium chloride 0.9 % 200 mL IVPB        200 mg 78 mL/hr over 200 Minutes Intravenous  Once 04/14/21 0755 04/14/21 1500   04/13/21 0900  meropenem (MERREM) 1 g in sodium chloride 0.9 % 100 mL IVPB  Status:  Discontinued        1 g 200 mL/hr over 30 Minutes Intravenous Every 8 hours 04/13/21 0800 04/17/21 1530   04/10/21 2200  cefTRIAXone (ROCEPHIN) 2 g in sodium chloride 0.9 %  100 mL IVPB  Status:  Discontinued        2 g 200 mL/hr over 30 Minutes Intravenous Daily at bedtime 04/10/21 2044 04/13/21 0746   04/10/21 2200  metroNIDAZOLE (FLAGYL) IVPB 500 mg  Status:  Discontinued        500 mg 100 mL/hr over 60 Minutes Intravenous Every 12 hours 04/10/21 2044 04/13/21 0745        Subjective: Seen and examined and she states she was doing okay.  She denies chest pain.  Had some discomfort in her bottom and did not sleep as well.  Blood count dropped again so type and screen and transfuse 2 units of PRBCs.  She denies any other concerns or complaints at this time and does not have any chest pain.  Objective: Vitals:   04/25/21 1400 04/25/21 1455 04/25/21 1500 04/25/21 1510  BP: (!) 129/99 135/79 (!) 128/100 132/87  Pulse: (!) 128 (!) 123 (!) 124 (!) 123  Resp: 11 17 17 15   Temp:  98.6 F (37 C)  98.4 F (36.9 C)  TempSrc:  Oral  Oral  SpO2: 98% 100% 100% 100%  Weight:      Height:        Intake/Output Summary (Last 24 hours) at 04/25/2021 1654 Last data filed at 04/25/2021 1500 Gross per 24 hour  Intake 1883.15 ml  Output 3810 ml  Net -1926.85 ml    Filed  Weights   04/15/21 1800 04/23/21 1752 04/24/21 0700  Weight: 99.5 kg 100.5 kg 102.4 kg   Examination: Physical Exam:  Constitutional: WN/WD morbidly obese African-American female currently in no acute distress but appears a little uncomfortable Eyes: Lids and conjunctivae normal, sclerae anicteric  ENMT: External Ears, Nose appear normal. Grossly normal hearing. Mucous membranes are moist.  Neck: Appears normal, supple, no cervical masses, normal ROM, no appreciable thyromegaly: No appreciable JVD Respiratory: Diminished to auscultation bilaterally, no wheezing, rales, rhonchi or crackles. Normal respiratory effort and patient is not tachypenic. No accessory muscle use.  Cardiovascular: Tachycardic rate with a heart rate in the 120s to 130s but is sinus, no murmurs / rubs / gallops. S1 and S2 auscultated.  Is 1+ lower extremity edema Abdomen: Soft, non-tender, distended secondary to body habitus.  Is a ostomy in place as well as a right-sided abdominal wound VAC and a drain on the left side of her abdomen. Bowel sounds positive.  GU: Deferred. Musculoskeletal: No clubbing / cyanosis of digits/nails. Normal strength and muscle tone.  Skin: No rashes, lesions, ulcers on limited skin evaluation. No induration; Warm and dry.  Neurologic: CN 2-12 grossly intact with no focal deficits. Romberg sign and cerebellar reflexes not assessed.  Psychiatric: Normal judgment and insight. Alert and oriented x 3. Normal mood and appropriate affect.   Data Reviewed: I have personally reviewed following labs and imaging studies  CBC: Recent Labs  Lab 04/21/21 0500 04/22/21 0418 04/23/21 0522 04/24/21 0500 04/25/21 0530  WBC 11.3* 11.6* 11.5* 12.7* 14.2*  NEUTROABS  --   --  9.2* 9.4* 10.9*  HGB 7.2* 7.3* 6.9* 7.5* 6.5*  HCT 21.8* 22.9* 21.7* 23.7* 20.8*  MCV 95.6 90.5 91.6 91.2 93.7  PLT 349 421* 433* 538* 709*    Basic Metabolic Panel: Recent Labs  Lab 04/21/21 0600 04/22/21 0418  04/23/21 0522 04/24/21 0500 04/25/21 0530  NA 139 136 136 135 136  K 2.8* 3.3* 4.7 4.9 4.7  CL 107 102 100 99 99  CO2 26 29 30 31 31   GLUCOSE  94 107* 100* 92 106*  BUN 21* 25* 27* 22* 26*  CREATININE 0.35* 0.31* <0.30* <0.30* 0.35*  CALCIUM 7.5* 8.4* 8.2* 8.3* 8.5*  MG 1.3* 1.9 2.0 1.9 2.1  PHOS 4.1 1.4* 3.8 4.6 5.3*    GFR: Estimated Creatinine Clearance: 110 mL/min (A) (by C-G formula based on SCr of 0.35 mg/dL (L)). Liver Function Tests: Recent Labs  Lab 04/20/21 0530 04/22/21 0418 04/23/21 0522 04/24/21 0500 04/25/21 0530  AST 10* 12* 15 11* 12*  ALT 9 9 10 10 10   ALKPHOS 43 63 68 71 108  BILITOT 0.5 0.4 0.5 0.5 0.3  PROT 4.9* 5.2* 5.3* 5.5* 5.8*  ALBUMIN 2.7* 2.4* 2.2* 2.2* 2.3*    No results for input(s): LIPASE, AMYLASE in the last 168 hours. No results for input(s): AMMONIA in the last 168 hours. Coagulation Profile: Recent Labs  Lab 04/19/21 1500  INR 1.2    Cardiac Enzymes: No results for input(s): CKTOTAL, CKMB, CKMBINDEX, TROPONINI in the last 168 hours. BNP (last 3 results) No results for input(s): PROBNP in the last 8760 hours. HbA1C: No results for input(s): HGBA1C in the last 72 hours. CBG: Recent Labs  Lab 04/24/21 1738 04/24/21 2327 04/25/21 0530 04/25/21 1156 04/25/21 1605  GLUCAP 118* 101* 95 94 101*    Lipid Profile: No results for input(s): CHOL, HDL, LDLCALC, TRIG, CHOLHDL, LDLDIRECT in the last 72 hours.  Thyroid Function Tests: No results for input(s): TSH, T4TOTAL, FREET4, T3FREE, THYROIDAB in the last 72 hours. Anemia Panel: No results for input(s): VITAMINB12, FOLATE, FERRITIN, TIBC, IRON, RETICCTPCT in the last 72 hours.  Sepsis Labs: Recent Labs  Lab 04/20/21 0052 04/20/21 0335  LATICACIDVEN 1.1 1.0     No results found for this or any previous visit (from the past 240 hour(s)).   RN Pressure Injury Documentation:     Estimated body mass index is 39.99 kg/m as calculated from the following:   Height as  of this encounter: 5\' 3"  (1.6 m).   Weight as of this encounter: 102.4 kg.  Malnutrition Type:  Nutrition Problem: Inadequate oral intake Etiology: acute illness, inability to eat  Malnutrition Characteristics:  Signs/Symptoms: per patient/family report, NPO status  Nutrition Interventions:  Interventions: Ensure Enlive (each supplement provides 350kcal and 20 grams of protein), TPN   Radiology Studies: No results found.  Scheduled Meds:  acetaminophen  1,000 mg Oral Q6H   chlorhexidine  15 mL Mouth/Throat BID   Chlorhexidine Gluconate Cloth  6 each Topical Daily   diclofenac Sodium  4 g Topical QID   feeding supplement  237 mL Oral BID BM   furosemide  40 mg Intravenous BID   insulin aspart  0-9 Units Subcutaneous Q6H   lip balm  1 application Topical BID   metoprolol tartrate  25 mg Oral BID   [START ON 04/26/2021] pantoprazole  40 mg Oral Daily   Continuous Infusions:  sodium chloride Stopped (04/19/21 1232)   methocarbamol (ROBAXIN) IV      LOS: 15 days   Kerney Elbe, DO [facility] PAGER is on AMION  If 7PM-7AM, please contact night-coverage [email or web address]

## 2021-04-26 ENCOUNTER — Other Ambulatory Visit: Payer: Self-pay | Admitting: Nurse Practitioner

## 2021-04-26 DIAGNOSIS — I10 Essential (primary) hypertension: Secondary | ICD-10-CM | POA: Diagnosis not present

## 2021-04-26 DIAGNOSIS — D649 Anemia, unspecified: Secondary | ICD-10-CM | POA: Diagnosis not present

## 2021-04-26 DIAGNOSIS — K5720 Diverticulitis of large intestine with perforation and abscess without bleeding: Secondary | ICD-10-CM | POA: Diagnosis not present

## 2021-04-26 LAB — CBC WITH DIFFERENTIAL/PLATELET
Abs Immature Granulocytes: 0.46 10*3/uL — ABNORMAL HIGH (ref 0.00–0.07)
Basophils Absolute: 0.1 10*3/uL (ref 0.0–0.1)
Basophils Relative: 0 %
Eosinophils Absolute: 0.1 10*3/uL (ref 0.0–0.5)
Eosinophils Relative: 1 %
HCT: 30.7 % — ABNORMAL LOW (ref 36.0–46.0)
Hemoglobin: 9.9 g/dL — ABNORMAL LOW (ref 12.0–15.0)
Immature Granulocytes: 3 %
Lymphocytes Relative: 13 %
Lymphs Abs: 1.8 10*3/uL (ref 0.7–4.0)
MCH: 28 pg (ref 26.0–34.0)
MCHC: 32.2 g/dL (ref 30.0–36.0)
MCV: 86.7 fL (ref 80.0–100.0)
Monocytes Absolute: 0.9 10*3/uL (ref 0.1–1.0)
Monocytes Relative: 6 %
Neutro Abs: 10.8 10*3/uL — ABNORMAL HIGH (ref 1.7–7.7)
Neutrophils Relative %: 77 %
Platelets: 652 10*3/uL — ABNORMAL HIGH (ref 150–400)
RBC: 3.54 MIL/uL — ABNORMAL LOW (ref 3.87–5.11)
RDW: 17 % — ABNORMAL HIGH (ref 11.5–15.5)
WBC: 14.1 10*3/uL — ABNORMAL HIGH (ref 4.0–10.5)
nRBC: 0.3 % — ABNORMAL HIGH (ref 0.0–0.2)

## 2021-04-26 LAB — TYPE AND SCREEN
ABO/RH(D): A POS
Antibody Screen: NEGATIVE
Unit division: 0
Unit division: 0
Unit division: 0
Unit division: 0

## 2021-04-26 LAB — GLUCOSE, CAPILLARY
Glucose-Capillary: 93 mg/dL (ref 70–99)
Glucose-Capillary: 102 mg/dL — ABNORMAL HIGH (ref 70–99)
Glucose-Capillary: 83 mg/dL (ref 70–99)

## 2021-04-26 LAB — BPAM RBC
Blood Product Expiration Date: 202212052359
Blood Product Expiration Date: 202212062359
Blood Product Expiration Date: 202212092359
Blood Product Expiration Date: 202212092359
ISSUE DATE / TIME: 202211171134
ISSUE DATE / TIME: 202211191444
ISSUE DATE / TIME: 202211191729
Unit Type and Rh: 6200
Unit Type and Rh: 6200
Unit Type and Rh: 6200
Unit Type and Rh: 6200

## 2021-04-26 LAB — HEMOGLOBIN AND HEMATOCRIT, BLOOD
HCT: 29.8 % — ABNORMAL LOW (ref 36.0–46.0)
Hemoglobin: 9.7 g/dL — ABNORMAL LOW (ref 12.0–15.0)

## 2021-04-26 LAB — MAGNESIUM: Magnesium: 1.8 mg/dL (ref 1.7–2.4)

## 2021-04-26 LAB — COMPREHENSIVE METABOLIC PANEL
ALT: 12 U/L (ref 0–44)
AST: 15 U/L (ref 15–41)
Albumin: 2.5 g/dL — ABNORMAL LOW (ref 3.5–5.0)
Alkaline Phosphatase: 87 U/L (ref 38–126)
Anion gap: 7 (ref 5–15)
BUN: 18 mg/dL (ref 6–20)
CO2: 32 mmol/L (ref 22–32)
Calcium: 8.3 mg/dL — ABNORMAL LOW (ref 8.9–10.3)
Chloride: 95 mmol/L — ABNORMAL LOW (ref 98–111)
Creatinine, Ser: 0.41 mg/dL — ABNORMAL LOW (ref 0.44–1.00)
GFR, Estimated: >60 mL/min (ref >60)
Glucose, Bld: 93 mg/dL (ref 70–99)
Potassium: 4 mmol/L (ref 3.5–5.1)
Sodium: 134 mmol/L — ABNORMAL LOW (ref 135–145)
Total Bilirubin: 0.7 mg/dL (ref 0.3–1.2)
Total Protein: 6.4 g/dL — ABNORMAL LOW (ref 6.5–8.1)

## 2021-04-26 LAB — PHOSPHORUS: Phosphorus: 5.5 mg/dL — ABNORMAL HIGH (ref 2.5–4.6)

## 2021-04-26 MED ORDER — MAGNESIUM SULFATE 2 GM/50ML IV SOLN
2.0000 g | Freq: Once | INTRAVENOUS | Status: AC
  Administered 2021-04-26: 2 g via INTRAVENOUS
  Filled 2021-04-26: qty 50

## 2021-04-26 MED ORDER — DIPHENHYDRAMINE HCL 25 MG PO CAPS
25.0000 mg | ORAL_CAPSULE | Freq: Four times a day (QID) | ORAL | Status: DC | PRN
  Administered 2021-04-26 – 2021-04-30 (×5): 25 mg via ORAL
  Filled 2021-04-26 (×5): qty 1

## 2021-04-26 NOTE — Progress Notes (Signed)
11 Days Post-Op   Subjective/Chief Complaint: Prbcs yesterday with appropriate increase, tol diet, tpn off, oob   Objective: Vital signs in last 24 hours: Temp:  [98.1 F (36.7 C)-98.8 F (37.1 C)] 98.1 F (36.7 C) (11/20 0759) Pulse Rate:  [104-136] 115 (11/20 0700) Resp:  [11-27] 21 (11/20 0700) BP: (100-155)/(69-107) 115/94 (11/20 0300) SpO2:  [95 %-100 %] 95 % (11/20 0700) Last BM Date: 04/23/21  Intake/Output from previous day: 11/19 0701 - 11/20 0700 In: 953.5 [I.V.:235.5; Blood:718] Out: 8676 [Urine:3600; Stool:25] Intake/Output this shift: No intake/output data recorded.  Ab vac in place functional, stoma pink and functional no blood, jp serous, soft nondistended   Lab Results:  Recent Labs    04/25/21 0530 04/25/21 2258 04/26/21 0515  WBC 14.2*  --  14.1*  HGB 6.5* 9.7* 9.9*  HCT 20.8* 29.8* 30.7*  PLT 709*  --  652*   BMET Recent Labs    04/25/21 0530 04/26/21 0515  NA 136 134*  K 4.7 4.0  CL 99 95*  CO2 31 32  GLUCOSE 106* 93  BUN 26* 18  CREATININE 0.35* 0.41*  CALCIUM 8.5* 8.3*   PT/INR No results for input(s): LABPROT, INR in the last 72 hours. ABG No results for input(s): PHART, HCO3 in the last 72 hours.  Invalid input(s): PCO2, PO2  Studies/Results: No results found.  Anti-infectives: Anti-infectives (From admission, onward)    Start     Dose/Rate Route Frequency Ordered Stop   04/17/21 1700  ceFEPIme (MAXIPIME) 2 g in sodium chloride 0.9 % 100 mL IVPB  Status:  Discontinued        2 g 200 mL/hr over 30 Minutes Intravenous Every 8 hours 04/17/21 1530 04/21/21 0808   04/17/21 1700  metroNIDAZOLE (FLAGYL) IVPB 500 mg  Status:  Discontinued        500 mg 100 mL/hr over 60 Minutes Intravenous Every 12 hours 04/17/21 1530 04/21/21 0808   04/15/21 1000  anidulafungin (ERAXIS) 100 mg in sodium chloride 0.9 % 100 mL IVPB  Status:  Discontinued        100 mg 78 mL/hr over 100 Minutes Intravenous Every 24 hours 04/14/21 0744 04/21/21  0808   04/15/21 0900  clindamycin (CLEOCIN) IVPB 900 mg       See Hyperspace for full Linked Orders Report.   900 mg 100 mL/hr over 30 Minutes Intravenous 60 min pre-op 04/14/21 1405 04/15/21 1142   04/15/21 0900  gentamicin (GARAMYCIN) 340 mg in dextrose 5 % 100 mL IVPB       See Hyperspace for full Linked Orders Report.   5 mg/kg  67.7 kg (Adjusted) 108.5 mL/hr over 60 Minutes Intravenous 60 min pre-op 04/14/21 1405 04/15/21 1834   04/14/21 1000  anidulafungin (ERAXIS) 200 mg in sodium chloride 0.9 % 200 mL IVPB        200 mg 78 mL/hr over 200 Minutes Intravenous  Once 04/14/21 0755 04/14/21 1500   04/13/21 0900  meropenem (MERREM) 1 g in sodium chloride 0.9 % 100 mL IVPB  Status:  Discontinued        1 g 200 mL/hr over 30 Minutes Intravenous Every 8 hours 04/13/21 0800 04/17/21 1530   04/10/21 2200  cefTRIAXone (ROCEPHIN) 2 g in sodium chloride 0.9 % 100 mL IVPB  Status:  Discontinued        2 g 200 mL/hr over 30 Minutes Intravenous Daily at bedtime 04/10/21 2044 04/13/21 0746   04/10/21 2200  metroNIDAZOLE (FLAGYL) IVPB 500 mg  Status:  Discontinued        500 mg 100 mL/hr over 60 Minutes Intravenous Every 12 hours 04/10/21 2044 04/13/21 0745       Assessment/Plan: POD#11 - S/P LAPAROSCOPIC ASSISTED HARTMANN  (LEFT COLECTOMY / END COLOSTOMY), SMALL BOWEL RESECTION, LEFT RETROPERITONEAL / FLANK ABSCESS DRAINAGE 04/15/21 Dr. 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

## 2021-04-26 NOTE — Progress Notes (Signed)
PROGRESS NOTE    Mikayla Rios  JQZ:[id number] DOB: 12/20/1985 DOA: 04/10/2021 PCP: Mikayla Chard, MD   Brief Narrative:  The patient is a 35 year old obese Mikayla Rios female with a past medical history significant for [place] to hypertension, inappropriate sinus tachycardia, obesity, obstructive sleep apnea, history of diverticulosis with complicated diverticulitis, history of abscess formation on 622 22 status post IR guided placement to left lower quadrant drains who presented to [facility] ED with complaints of generalized weakness and lightheadedness.  She is found to have any abscess measuring about 8.5 x 10 cm extending into the left lateral abdominal wall.  She underwent drain exchange and upsizing by IR on Monday and then subsequently underwent a colectomy and colostomy with small bowel resection and retroperitoneal abscess drainage on 04/15/2021.  Postoperatively she was monitored in the ICU the vent and briefly required pressors.  She is extubated 04/16/2021 and has since been weaned off of pressors.  She was transferred back to [facility] on 04/18/2021 CIR is evaluating her for placement.  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  Medical oncology has been consulted and they feel that she would be benefit and from post adjuvant chemotherapy and perhaps focal radiation therapy.  She is will be discussed at the tumor board.  There will be consideration of Port-A-Cath placement while she is here and general surgery feels that they are unlikely attempt colostomy takedown and anastomosis for least a year or so until there is no local recurrence or metastatic disease.  She continues to be tachycardic but states that her heart rate has always been over 100.  Blood count continues to drop so we will type and screen under 2 units today to make a total of 10 units to be transfused.  Her diet has been advanced to soft diet and will obtain a calorie count to evaluate how much she is eating as she  is also being continued on TPN for the time being but has now been discontinued by general surgery.  Surgery evaluated and recommending wound VAC change tomorrow and recommends removing right-sided drain prior to discharge to CIR.  Given that her WBC remains mildly elevated they also considering repeating CT scan prior to discharge.  Assessment & Plan:   Principal Problem:   Diverticulitis of large intestine with abscess Active Problems:   Class 3 severe obesity due to excess calories with serious comorbidity and body mass index (BMI) of 60.0 to 69.9 in adult [facility])   Obstructive sleep apnea   Anemia   Inappropriate sinus tachycardia   Essential hypertension   SIRS (systemic inflammatory response syndrome) (HCC)   Hypokalemia, inadequate intake   Protein-calorie malnutrition, severe (HCC)   Reactive thrombocytosis   Prolonged QT interval   Malignant neoplasm of sigmoid colon (Mikayla Rios)  Complicated diverticulitis with abscess s/p drain placement by IR twice in June 2022 with numerous exchanges since then due to persistent fistulous -CT of the abdomen and pelvis New lobulated enhancing fluid collection posterior to this segment of inflamed colon measuring 8.5 x 3.5 x 10.0 cm. This collection now invades the adjacent iliopsoas muscle as well as extends through the left lateral abdominal wall. -Patient has had prolonged treatment with antibiotics and multiple drain placements, but despite conservative measures, she has failed to improve and now has a new abscess -Seen by general surgery and she underwent colectomy/colostomy and drainage of retroperitoneal abscess -She is completed a course of IV antibiotics -Has been removed and diet is being advanced per general surgery -  She has wound VAC over abdominal wound -Defer further postop care to surgery and they had a long discussion with the patient about her adenocarcinoma -Diet has been advanced to a soft diet and surgeries continue TPN for  now -We will order a calorie count to evaluate and she is now off of TPN.  Calorie count results are still pending -General surgery evaluated and recommending considering repeating CT scan of the abdomen pelvis prior to discharge to CIR and removing the drain prior to CIR -She is going to get a wound VAC change tomorrow   Septic shock -Secondary to retroperitoneal abscess -Patient required vasopressor therapy postoperatively, but has been able to wean off pressors -Blood pressures now elevated -WBC has trended down and went from 13.4 -> 11.3 -> 11.6 -> 11.5 but is now trending up and went to 12.7 and is further elevated 14.2 and today it is 14.1 -She has completed antibiotics -As above she is not febrile and has bowel function but general surgery considering repeating CT scan of the abdomen pelvis prior to discharge and changing her wound VAC tomorrow and removing the right-sided drain is in place prior to discharge   Acute respiratory failure with hypoxia requiring mechanical ventilation -Patient was intubated for operative management and due to hemodynamic instability postoperatively, decision was made to leave the patient intubated and monitored in the ICU -She was able to extubate on 11/10 and respiratory status has been stable -Continue with pulmonary toileting and continue monitor respiratory status carefully   Adenocarcinoma colon -New diagnosis; 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  -Medical oncology consulted and they discussed the biopsy results with her and they feel that she will need to heal from her surgery.  She is planning going to CIR and when she is recovered we will consider systemic chemotherapy for her and may consider getting a genetics  counseling as an outpatient   Mechanical Fall:  -Patient reports dizziness and an episode of syncope and an episode of mechanical fall possibly from orthostatic hypotension.  -Monitor on telemetry. -CT angiogram of the chest ruled out PE -EKG shows prolonged QT interval 636.  Nonspecific T wave inversions.troponin is negative. Patient denies any chest pain or shortness of breath. -Probably secondary to orthostatic hypotension from infection. -Overall QTC has improved -PT OT recommending CIR   Hypokalemia Hypophosphatemia -Currently being replaced with TPN protocol however now she is off of TPN -Potassium is 4.0 and mag level is 1.8 -Continue to monitor and replete as necessary   Prolonged QTc: -From hypokalemia and hypomagnesemia. -K is 4.0 today. magnesium is 1.8. -Keep k >4 and mag>2.  -Repeat EKG shows improvement in the qtC.     Anemia of chronic disease/ Acute anemia of acute illness -Normocytic.  -Baseline creatinine appears to be between 7-8 -Patient noted to have significant bleeding in ostomy -She was transfused a total of 8 units PRBCs thus far and will receive another 2 units today; she was transfused a total of 10 units -Hgb/hematocrit dropped again and now improved from 6.5/20.8 and trended up to 9.7/29.8 and today is 9.9/30.7; we will type and screen and transfuse 2 more units and continue Lasix 40 mg twice daily -Continue to follow CBC -Transfuse to keep hemoglobin greater than 7.   Post op bleeding from ostomy -  concern it may be bleeding at small bowel anastomosis -defer further intervention if needed to general surgery -She received 2 units of FFP on 11/13 -Overall bleeding seems to be improving -continue to follow hemoglobin and supportive care with PRBC transfusions -See above   Anasarca -related to hypoalbuminemia and aggressive volume repletion as well as blood transfusions -Received albumin infusion and IV lasix 20 mg twice daily with good urine  output -Swelling appears to be improving however upon further review she was +16.449 L since admission the day before yesterday so we increased Lasix to 40 mg twice daily and continue; today she has + 6.622 8 L   Hypoalbuminemia  -will get dietary consult.  -currently on TPN was n.p.o. but diet was advanced to soft.  We will obtain a calorie count and this is day 2 of Calorie Count is complete and awaiting resuls -Albumin Level was 2.5   Sinus Tachycardia  -Probably from anemia and infection and pain.  -Pt not symptomatic.  Heart rate is ranging from 110s to 140s still and still remain persistently elevated -Echocardiogram unremarkable -Transfuse 2 units of pRBCs as above  -Continue on IV Lopressor and was transitioned to 12.5 mg p.o. twice daily and will go to 25 mg p.o. twice daily -She does have a history of inappropriate sinus tachycardia and has been worked up by cardiology in outpatient setting by Dr. Gardiner Rhyme -Continue monitor heart rates clinically   Hypertension  Currently on IV lopressor and prn hydralazine -Restart on oral Lopressor 25 mg po BID -Continue to monitor blood pressures per protocol -Last blood pressure reading was 122/34  Thrombocytosis -Patient's platelet count went from 421 -> 433 -> 538 -> 709 and is now 652 -Continue to monitor and trend and repeat CBC in a.m.   DVT prophylaxis: SCDs Code Status: Full code Family Communication: No family currently at bedside discussed with family at bedside Disposition Plan: Pending further clinical improvement; Once blood count stablizes she can be D/C'd to CIR; will transfer to the medical floor given that she is stable from a ICU perspective  Status is: Inpatient  Remains inpatient appropriate because: She is not yet back to her baseline and will need to tolerate her diet and be off the TPN for safe discharge disposition as she will need to also go to CIR  Consultants:  PCCM transfer General surgery Medical  oncology Cardiology Gastroenterology  Procedures:  Laparoscopic left colectomy/end colostomy, small bowel resection, left retroperitoneal/flank abscess drainage   ETT 11/09>11/10  LE Venous Duplex   Antimicrobials:  Anti-infectives (From admission, onward)    Start     Dose/Rate Route Frequency Ordered Stop   04/17/21 1700  ceFEPIme (MAXIPIME) 2 g in sodium chloride 0.9 % 100 mL IVPB  Status:  Discontinued        2 g 200 mL/hr over 30 Minutes Intravenous Every 8 hours 04/17/21 1530 04/21/21 0808   04/17/21 1700  metroNIDAZOLE (FLAGYL) IVPB 500 mg  Status:  Discontinued        500 mg 100 mL/hr over 60 Minutes Intravenous Every 12 hours 04/17/21 1530 04/21/21 0808   04/15/21 1000  anidulafungin (ERAXIS) 100 mg in sodium chloride 0.9 % 100 mL IVPB  Status:  Discontinued        100 mg 78 mL/hr over 100 Minutes Intravenous Every 24 hours 04/14/21 0744 04/21/21 0808   04/15/21 0900  clindamycin (CLEOCIN) IVPB 900 mg       See Hyperspace for full Linked Orders Report.  900 mg 100 mL/hr over 30 Minutes Intravenous 60 min pre-op 04/14/21 1405 04/15/21 1142   04/15/21 0900  gentamicin (GARAMYCIN) 340 mg in dextrose 5 % 100 mL IVPB       See Hyperspace for full Linked Orders Report.   5 mg/kg  67.7 kg (Adjusted) 108.5 mL/hr over 60 Minutes Intravenous 60 min pre-op 04/14/21 1405 04/15/21 1834   04/14/21 1000  anidulafungin (ERAXIS) 200 mg in sodium chloride 0.9 % 200 mL IVPB        200 mg 78 mL/hr over 200 Minutes Intravenous  Once 04/14/21 0755 04/14/21 1500   04/13/21 0900  meropenem (MERREM) 1 g in sodium chloride 0.9 % 100 mL IVPB  Status:  Discontinued        1 g 200 mL/hr over 30 Minutes Intravenous Every 8 hours 04/13/21 0800 04/17/21 1530   04/10/21 2200  cefTRIAXone (ROCEPHIN) 2 g in sodium chloride 0.9 % 100 mL IVPB  Status:  Discontinued        2 g 200 mL/hr over 30 Minutes Intravenous Daily at bedtime 04/10/21 2044 04/13/21 0746   04/10/21 2200  metroNIDAZOLE (FLAGYL) IVPB  500 mg  Status:  Discontinued        500 mg 100 mL/hr over 60 Minutes Intravenous Every 12 hours 04/10/21 2044 04/13/21 0745        Subjective: Seen and examined and she she states that she is doing well and had good appetite last night.  Denies chest pain or shortness of breath.  Blood count appropriate responded.  She feels well.  She denies any other concerns or questions time.  Objective: Vitals:   04/26/21 1600 04/26/21 1649 04/26/21 1700 04/26/21 1800  BP: 119/80  123/80 (!) 122/34  Pulse: (!) 110 (!) 113 (!) 108 (!) 116  Resp: 19 18 (!) 23 (!) 24  Temp: 98.1 F (36.7 C) 98.3 F (36.8 C)    TempSrc: Oral Oral    SpO2: 96% 98% 98% 98%  Weight:      Height:        Intake/Output Summary (Last 24 hours) at 04/26/2021 1954 Last data filed at 04/26/2021 1844 Gross per 24 hour  Intake 569.64 ml  Output 2300 ml  Net -1730.36 ml    Filed Weights   04/15/21 1800 04/23/21 1752 04/24/21 0700  Weight: 99.5 kg 100.5 kg 102.4 kg   Examination: Physical Exam:  Constitutional: WN/WD morbidly obese African-American female currently no acute distress appears calm and comfortable  Eyes:  Lids and conjunctivae normal, sclerae anicteric  ENMT: External Ears, Nose appear normal. Grossly normal hearing. Mucous membranes are moist.  Neck: Appears normal, supple, no cervical masses, normal ROM, no appreciable thyromegaly; no appreciable JVD Respiratory: Diminished to auscultation bilaterally, no wheezing, rales, rhonchi or crackles. Normal respiratory effort and patient is not tachypenic. No accessory muscle use.  Unlabored breathing Cardiovascular: Tachycardic rate with a heart rate in the 120s 130s, no murmurs / rubs / gallops. S1 and S2 auscultated. 1+ extremity edema Abdomen: Soft, non-tender, distended secondary body habitus. Bowel sounds positive.  Has a colostomy in place as well as a wound VAC and abdominal drain GU: Deferred. Musculoskeletal: No clubbing / cyanosis of  digits/nails. No joint deformity upper and lower extremities. Skin: No rashes, lesions, ulcers on limited skin evaluation. No induration; Warm and dry.  Neurologic: CN 2-12 grossly intact with no focal deficits. Romberg sign and cerebellar reflexes not assessed.  Psychiatric: Normal judgment and insight. Alert and oriented x 3. Normal  mood and appropriate affect.   Data Reviewed: I have personally reviewed following labs and imaging studies  CBC: Recent Labs  Lab 04/22/21 0418 04/23/21 0522 04/24/21 0500 04/25/21 0530 04/25/21 2258 04/26/21 0515  WBC 11.6* 11.5* 12.7* 14.2*  --  14.1*  NEUTROABS  --  9.2* 9.4* 10.9*  --  10.8*  HGB 7.3* 6.9* 7.5* 6.5* 9.7* 9.9*  HCT 22.9* 21.7* 23.7* 20.8* 29.8* 30.7*  MCV 90.5 91.6 91.2 93.7  --  86.7  PLT 421* 433* 538* 709*  --  652*    Basic Metabolic Panel: Recent Labs  Lab 04/22/21 0418 04/23/21 0522 04/24/21 0500 04/25/21 0530 04/26/21 0515  NA 136 136 135 136 134*  K 3.3* 4.7 4.9 4.7 4.0  CL 102 100 99 99 95*  CO2 29 30 31 31  32  GLUCOSE 107* 100* 92 106* 93  BUN 25* 27* 22* 26* 18  CREATININE 0.31* <0.30* <0.30* 0.35* 0.41*  CALCIUM 8.4* 8.2* 8.3* 8.5* 8.3*  MG 1.9 2.0 1.9 2.1 1.8  PHOS 1.4* 3.8 4.6 5.3* 5.5*    GFR: Estimated Creatinine Clearance: 110 mL/min (A) (by C-G formula based on SCr of 0.41 mg/dL (L)). Liver Function Tests: Recent Labs  Lab 04/22/21 0418 04/23/21 0522 04/24/21 0500 04/25/21 0530 04/26/21 0515  AST 12* 15 11* 12* 15  ALT 9 10 10 10 12   ALKPHOS 63 68 71 108 87  BILITOT 0.4 0.5 0.5 0.3 0.7  PROT 5.2* 5.3* 5.5* 5.8* 6.4*  ALBUMIN 2.4* 2.2* 2.2* 2.3* 2.5*    No results for input(s): LIPASE, AMYLASE in the last 168 hours. No results for input(s): AMMONIA in the last 168 hours. Coagulation Profile: No results for input(s): INR, PROTIME in the last 168 hours.  Cardiac Enzymes: No results for input(s): CKTOTAL, CKMB, CKMBINDEX, TROPONINI in the last 168 hours. BNP (last 3 results) No  results for input(s): PROBNP in the last 8760 hours. HbA1C: No results for input(s): HGBA1C in the last 72 hours. CBG: Recent Labs  Lab 04/25/21 1605 04/25/21 2325 04/26/21 0514 04/26/21 1159 04/26/21 1803  GLUCAP 101* 97 93 102* 83    Lipid Profile: No results for input(s): CHOL, HDL, LDLCALC, TRIG, CHOLHDL, LDLDIRECT in the last 72 hours.  Thyroid Function Tests: No results for input(s): TSH, T4TOTAL, FREET4, T3FREE, THYROIDAB in the last 72 hours. Anemia Panel: No results for input(s): VITAMINB12, FOLATE, FERRITIN, TIBC, IRON, RETICCTPCT in the last 72 hours.  Sepsis Labs: Recent Labs  Lab 04/20/21 0052 04/20/21 0335  LATICACIDVEN 1.1 1.0     No results found for this or any previous visit (from the past 240 hour(s)).   RN Pressure Injury Documentation:     Estimated body mass index is 39.99 kg/m as calculated from the following:   Height as of this encounter: 5\' 3"  (1.6 m).   Weight as of this encounter: 102.4 kg.  Malnutrition Type:  Nutrition Problem: Inadequate oral intake Etiology: acute illness, inability to eat  Malnutrition Characteristics:  Signs/Symptoms: per patient/family report, NPO status  Nutrition Interventions:  Interventions: Ensure Enlive (each supplement provides 350kcal and 20 grams of protein), TPN   Radiology Studies: No results found.  Scheduled Meds:  acetaminophen  1,000 mg Oral Q6H   chlorhexidine  15 mL Mouth/Throat BID   Chlorhexidine Gluconate Cloth  6 each Topical Daily   diclofenac Sodium  4 g Topical QID   feeding supplement  237 mL Oral BID BM   furosemide  20 mg Intravenous Once   furosemide  40 mg Intravenous BID   insulin aspart  0-9 Units Subcutaneous Q6H   lip balm  1 application Topical BID   metoprolol tartrate  25 mg Oral BID   pantoprazole  40 mg Oral Daily   Continuous Infusions:  sodium chloride Stopped (04/19/21 1232)   methocarbamol (ROBAXIN) IV      LOS: 16 days   Kerney Elbe,  DO [facility] PAGER is on AMION  If 7PM-7AM, please contact night-coverage [email or web address]

## 2021-04-27 DIAGNOSIS — K5720 Diverticulitis of large intestine with perforation and abscess without bleeding: Secondary | ICD-10-CM | POA: Diagnosis not present

## 2021-04-27 DIAGNOSIS — D649 Anemia, unspecified: Secondary | ICD-10-CM | POA: Diagnosis not present

## 2021-04-27 DIAGNOSIS — I10 Essential (primary) hypertension: Secondary | ICD-10-CM | POA: Diagnosis not present

## 2021-04-27 DIAGNOSIS — C187 Malignant neoplasm of sigmoid colon: Secondary | ICD-10-CM

## 2021-04-27 LAB — CBC WITH DIFFERENTIAL/PLATELET
Abs Immature Granulocytes: 0.27 10*3/uL — ABNORMAL HIGH (ref 0.00–0.07)
Basophils Absolute: 0 10*3/uL (ref 0.0–0.1)
Basophils Relative: 0 %
Eosinophils Absolute: 0.1 10*3/uL (ref 0.0–0.5)
Eosinophils Relative: 1 %
HCT: 30 % — ABNORMAL LOW (ref 36.0–46.0)
Hemoglobin: 9.7 g/dL — ABNORMAL LOW (ref 12.0–15.0)
Immature Granulocytes: 2 %
Lymphocytes Relative: 16 %
Lymphs Abs: 2.1 10*3/uL (ref 0.7–4.0)
MCH: 28.6 pg (ref 26.0–34.0)
MCHC: 32.3 g/dL (ref 30.0–36.0)
MCV: 88.5 fL (ref 80.0–100.0)
Monocytes Absolute: 0.8 10*3/uL (ref 0.1–1.0)
Monocytes Relative: 6 %
Neutro Abs: 9.9 10*3/uL — ABNORMAL HIGH (ref 1.7–7.7)
Neutrophils Relative %: 75 %
Platelets: 716 10*3/uL — ABNORMAL HIGH (ref 150–400)
RBC: 3.39 MIL/uL — ABNORMAL LOW (ref 3.87–5.11)
RDW: 16.2 % — ABNORMAL HIGH (ref 11.5–15.5)
WBC: 13.2 10*3/uL — ABNORMAL HIGH (ref 4.0–10.5)
nRBC: 0 % (ref 0.0–0.2)

## 2021-04-27 LAB — COMPREHENSIVE METABOLIC PANEL
ALT: 13 U/L (ref 0–44)
AST: 15 U/L (ref 15–41)
Albumin: 2.5 g/dL — ABNORMAL LOW (ref 3.5–5.0)
Alkaline Phosphatase: 84 U/L (ref 38–126)
Anion gap: 8 (ref 5–15)
BUN: 15 mg/dL (ref 6–20)
CO2: 32 mmol/L (ref 22–32)
Calcium: 8.3 mg/dL — ABNORMAL LOW (ref 8.9–10.3)
Chloride: 98 mmol/L (ref 98–111)
Creatinine, Ser: 0.55 mg/dL (ref 0.44–1.00)
GFR, Estimated: >60 mL/min (ref >60)
Glucose, Bld: 89 mg/dL (ref 70–99)
Potassium: 3.4 mmol/L — ABNORMAL LOW (ref 3.5–5.1)
Sodium: 138 mmol/L (ref 135–145)
Total Bilirubin: 0.6 mg/dL (ref 0.3–1.2)
Total Protein: 6.6 g/dL (ref 6.5–8.1)

## 2021-04-27 LAB — GLUCOSE, CAPILLARY
Glucose-Capillary: 100 mg/dL — ABNORMAL HIGH (ref 70–99)
Glucose-Capillary: 106 mg/dL — ABNORMAL HIGH (ref 70–99)
Glucose-Capillary: 82 mg/dL (ref 70–99)
Glucose-Capillary: 96 mg/dL (ref 70–99)
Glucose-Capillary: 97 mg/dL (ref 70–99)

## 2021-04-27 LAB — PHOSPHORUS: Phosphorus: 4.9 mg/dL — ABNORMAL HIGH (ref 2.5–4.6)

## 2021-04-27 LAB — MAGNESIUM: Magnesium: 2 mg/dL (ref 1.7–2.4)

## 2021-04-27 MED ORDER — OXYCODONE HCL 5 MG PO TABS
10.0000 mg | ORAL_TABLET | ORAL | Status: DC | PRN
  Administered 2021-04-27: 10 mg via ORAL
  Administered 2021-04-28 (×2): 15 mg via ORAL
  Administered 2021-04-28 (×2): 10 mg via ORAL
  Administered 2021-04-29 – 2021-05-01 (×6): 15 mg via ORAL
  Filled 2021-04-27 (×3): qty 3
  Filled 2021-04-27: qty 2
  Filled 2021-04-27: qty 3
  Filled 2021-04-27 (×2): qty 2
  Filled 2021-04-27 (×4): qty 3

## 2021-04-27 MED ORDER — POTASSIUM CHLORIDE CRYS ER 20 MEQ PO TBCR
40.0000 meq | EXTENDED_RELEASE_TABLET | Freq: Two times a day (BID) | ORAL | Status: AC
  Administered 2021-04-27 (×2): 40 meq via ORAL
  Filled 2021-04-27 (×2): qty 2

## 2021-04-27 MED ORDER — HYDROMORPHONE HCL 1 MG/ML IJ SOLN
1.0000 mg | INTRAMUSCULAR | Status: DC | PRN
  Administered 2021-04-27: 1 mg via INTRAVENOUS
  Filled 2021-04-27: qty 1

## 2021-04-27 MED ORDER — FUROSEMIDE 10 MG/ML IJ SOLN
40.0000 mg | Freq: Every day | INTRAMUSCULAR | Status: DC
Start: 2021-04-28 — End: 2021-05-01
  Administered 2021-04-28 – 2021-04-30 (×3): 40 mg via INTRAVENOUS
  Filled 2021-04-27 (×4): qty 4

## 2021-04-27 NOTE — Consult Note (Signed)
[place] Nurse wound follow up Patient receiving care in [place]. Wound type: surgical Measurement: 8.2 cm x 2.2 cm x 3 cm Wound bed: clean, red Drainage (amount, consistency, odor) dark serosanginous in cannister Periwound: intact Dressing procedure/placement/frequency: one piece of black foam removed from wound bed. One piece of black foam placed into wound. Drape applied, immediate seal obtained. Patient received IV pain med prior to initiation of VAC change and tolerated procedure well.  [name] Nurse ostomy follow up Stoma type/location: LUQ colostomy Stomal assessment/size: 2 inches Peristomal assessment: intact Treatment options for stomal/peristomal skin: barrier ring Output: small amount of pudding consistency dark brown stool in existing pouch Ostomy pouching: 2pc. 2 and 3/4 inch pouching system. Primary RN asking [place] to order more supplies as clean utility is out. Education provided: how to remove, how to cleanse with water only, how to stretch the barrier ring and apply; how to cut the barrier and apply and how to snap the pouch on the skin barrier. Enrolled patient in [name] Discharge program: Yes, previously. Val Riles, RN, MSN, CWOCN, CNS-BC, pager [phone]

## 2021-04-27 NOTE — Progress Notes (Signed)
Patient ID: Mikayla Rios, female   DOB: 12/12/1985, 35 y.o.   MRN: [id number] [facility] Surgery Progress Note  12 Days Post-Op  Subjective: CC-  Feeling well today. Still requiring IV dilaudid. Denies n/v. Tolerating diet. Ostomy working.  Objective: Vital signs in last 24 hours: Temp:  [97.7 F (36.5 C)-98.4 F (36.9 C)] 97.7 F (36.5 C) (11/21 0453) Pulse Rate:  [101-130] 101 (11/21 0453) Resp:  [16-24] 16 (11/21 0453) BP: (105-153)/(34-104) 105/62 (11/21 0453) SpO2:  [92 %-100 %] 100 % (11/21 0453) Last BM Date: 04/26/21  Intake/Output from previous day: 11/20 0701 - 11/21 0700 In: 147.6 [I.V.:100; IV Piggyback:47.6] Out: 2000 [Urine:1800; Stool:200] Intake/Output this shift: No intake/output data recorded.  PE: Gen:  Alert, NAD, pleasant Pulm: rate and effort normal Abd: Soft, NT/ND, midline incision healing well with good granulation tissue, ostomy pink with new bag in place, right JP drain with serosanguinous fluid    Lab Results:  Recent Labs    04/26/21 0515 04/27/21 0357  WBC 14.1* 13.2*  HGB 9.9* 9.7*  HCT 30.7* 30.0*  PLT 652* 716*   BMET Recent Labs    04/26/21 0515 04/27/21 0357  NA 134* 138  K 4.0 3.4*  CL 95* 98  CO2 32 32  GLUCOSE 93 89  BUN 18 15  CREATININE 0.41* 0.55  CALCIUM 8.3* 8.3*   PT/INR No results for input(s): LABPROT, INR in the last 72 hours. CMP     Component Value Date/Time   NA 138 04/27/2021 0357   NA 139 12/21/2019 1024   K 3.4 (L) 04/27/2021 0357   CL 98 04/27/2021 0357   CO2 32 04/27/2021 0357   GLUCOSE 89 04/27/2021 0357   BUN 15 04/27/2021 0357   BUN 13 12/21/2019 1024   CREATININE 0.55 04/27/2021 0357   CALCIUM 8.3 (L) 04/27/2021 0357   PROT 6.6 04/27/2021 0357   PROT 7.4 07/11/2019 1638   ALBUMIN 2.5 (L) 04/27/2021 0357   ALBUMIN 4.6 07/11/2019 1638   AST 15 04/27/2021 0357   ALT 13 04/27/2021 0357   ALKPHOS 84 04/27/2021 0357   BILITOT 0.6 04/27/2021 0357   BILITOT 0.2 07/11/2019 1638    GFRNONAA >60 04/27/2021 0357   GFRAA 96 12/21/2019 1024   Lipase     Component Value Date/Time   LIPASE 21 11/04/2020 1119       Studies/Results: No results found.  Anti-infectives: Anti-infectives (From admission, onward)    Start     Dose/Rate Route Frequency Ordered Stop   04/17/21 1700  ceFEPIme (MAXIPIME) 2 g in sodium chloride 0.9 % 100 mL IVPB  Status:  Discontinued        2 g 200 mL/hr over 30 Minutes Intravenous Every 8 hours 04/17/21 1530 04/21/21 0808   04/17/21 1700  metroNIDAZOLE (FLAGYL) IVPB 500 mg  Status:  Discontinued        500 mg 100 mL/hr over 60 Minutes Intravenous Every 12 hours 04/17/21 1530 04/21/21 0808   04/15/21 1000  anidulafungin (ERAXIS) 100 mg in sodium chloride 0.9 % 100 mL IVPB  Status:  Discontinued        100 mg 78 mL/hr over 100 Minutes Intravenous Every 24 hours 04/14/21 0744 04/21/21 0808   04/15/21 0900  clindamycin (CLEOCIN) IVPB 900 mg       See Hyperspace for full Linked Orders Report.   900 mg 100 mL/hr over 30 Minutes Intravenous 60 min pre-op 04/14/21 1405 04/15/21 1142   04/15/21 0900  gentamicin (GARAMYCIN) 340  mg in dextrose 5 % 100 mL IVPB       See Hyperspace for full Linked Orders Report.   5 mg/kg  67.7 kg (Adjusted) 108.5 mL/hr over 60 Minutes Intravenous 60 min pre-op 04/14/21 1405 04/15/21 1834   04/14/21 1000  anidulafungin (ERAXIS) 200 mg in sodium chloride 0.9 % 200 mL IVPB        200 mg 78 mL/hr over 200 Minutes Intravenous  Once 04/14/21 0755 04/14/21 1500   04/13/21 0900  meropenem (MERREM) 1 g in sodium chloride 0.9 % 100 mL IVPB  Status:  Discontinued        1 g 200 mL/hr over 30 Minutes Intravenous Every 8 hours 04/13/21 0800 04/17/21 1530   04/10/21 2200  cefTRIAXone (ROCEPHIN) 2 g in sodium chloride 0.9 % 100 mL IVPB  Status:  Discontinued        2 g 200 mL/hr over 30 Minutes Intravenous Daily at bedtime 04/10/21 2044 04/13/21 0746   04/10/21 2200  metroNIDAZOLE (FLAGYL) IVPB 500 mg  Status:  Discontinued         500 mg 100 mL/hr over 60 Minutes Intravenous Every 12 hours 04/10/21 2044 04/13/21 0745        Assessment/Plan POD#12 - S/P LAPAROSCOPIC ASSISTED HARTMANN  (LEFT COLECTOMY / END COLOSTOMY), SMALL BOWEL RESECTION, LEFT RETROPERITONEAL / FLANK ABSCESS DRAINAGE 04/15/21 Dr. [name] - path: Colonic adenocarcinoma. Oncology has seen - wound vac MWF - Right sided drain in place- remove prior to discharge or CIR - ok for carb mod diet - ostomy function. WOC following - WBC trending down. Continue to monitor, if remains elevated may need CT prior to dc - increase oxy scale 10-15mg  and wean dilaudid  Anemia - hgb stable today, last transfusion was on 11/19. Transfuse PRN per primary  ID - maxipime/ flagyl 11/11>>11/15, eraxis 11/8>>11/15, merrem 11/7>> 11/11, rocephin/flagyul 11/4>>11/7 FEN - carb mod diet VTE - SCDs/ per primary, held due to abla  HTN Morbid obesity BMI 39.99   LOS: 17 days    Wellington Hampshire, [facility] Surgery 04/27/2021, 9:13 AM Please see Amion for pager number during day hours 7:00am-4:30pm

## 2021-04-27 NOTE — Progress Notes (Signed)
Occupational Therapy Treatment Patient Details Name: Mikayla Rios MRN: [id number] DOB: 1985/08/02 Today's Date: 04/27/2021   History of present illness Mikayla Rios is a 35 yo female presents with complaints of generalized weakness and lightheadedness on 04/10/21. She was found to have any abscess  left lateral abdominal wall.  She underwent drain exchange and upsizing by IR on 11/7 and then subsequently underwent a colectomy and colostomy with small bowel resection and retroperitoneal abscess drainage on 04/15/2021.  Postoperatively she was monitored in the ICU the vent and briefly required pressors.  She is extubated 04/16/2021 and has since been weaned off of pressors. PMH: HTN, sinus tachy, obesity, obstructive sleep apnea and diverticulosis with complicated diverticulitis and abscess formation 11/2020 status post IR guided placement of 2 left lower quadrant drains.   OT comments  Patient participated in LB dressing education on edge of bed with increased time. Patient participated in bed mobility and scooting on edge of bed with mod/max A. Patient is motivated to participate in all therapy even with pain noted. Patient would continue to benefit from skilled OT services at this time while admitted and after d/c to address noted deficits in order to improve overall safety and independence in ADLs.     Recommendations for follow up therapy are one component of a multi-disciplinary discharge planning process, led by the attending physician.  Recommendations may be updated based on patient status, additional functional criteria and insurance authorization.    Follow Up Recommendations  Acute inpatient rehab (3hours/day)    Assistance Recommended at Discharge Frequent or constant Supervision/Assistance  Equipment Recommendations  BSC/3in1    Recommendations for Other Services      Precautions / Restrictions Precautions Precautions: Fall Precaution Comments: monitor HR, colostomy, R drain,  VAC Restrictions Weight Bearing Restrictions: No       Mobility Bed Mobility Overal bed mobility: Needs Assistance Bed Mobility: Rolling;Sidelying to Sit Rolling: Max assist Sidelying to sit: Mod assist;HOB elevated   Sit to supine: Max assist   General bed mobility comments: patient attempted to participate in scooting to head of bed with increased time. patient needed physical assistance for trunk and BLE. patient was educated on importance of log rolling to reduce pain in abdomen    Transfers Overall transfer level: Needs assistance Equipment used: Rolling walker (2 wheels) Transfers: Sit to/from Stand Sit to Stand: Mod assist;+2 physical assistance           General transfer comment: Pt stood from elevated bed with min A of 1 with increased time, cues for momentum and hand placement.  However, from [facility] requiring mod A of 2, multiple attempts, and cues.     Balance Overall balance assessment: Needs assistance Sitting-balance support: No upper extremity supported Sitting balance-Leahy Scale: Good Sitting balance - Comments: seated EOB   Standing balance support: Bilateral upper extremity supported Standing balance-Leahy Scale: Poor Standing balance comment: reliant on walker                           ADL either performed or assessed with clinical judgement   ADL Overall ADL's : Needs assistance/impaired                     Lower Body Dressing: Maximal assistance;Sitting/lateral leans Lower Body Dressing Details (indicate cue type and reason): patient was educated on LB dressing with AE sitting on edge of bed. patient was able to don sock with min A to increase independence  in ADLs. patient was educated on total hip kit and toileting wands/tongs. patient reported she would look into them.                    Extremity/Trunk Assessment              Vision       Perception     Praxis      Cognition Arousal/Alertness:  Awake/alert Behavior During Therapy: WFL for tasks assessed/performed Overall Cognitive Status: Within Functional Limits for tasks assessed                                            Exercises General Exercises - Lower Extremity Ankle Circles/Pumps: AROM;Both;10 reps Long Arc Quad: AROM;Both;10 reps;Seated Hip ABduction/ADduction: Strengthening;Both;10 reps;Seated (pillow squeeze) Hip Flexion/Marching: AAROM;Both;10 reps;Seated   Shoulder Instructions       General Comments HR rest 120's-130's up to 150's with exercise but recovered in <2 mins.    Pertinent Vitals/ Pain       Pain Assessment: Faces Faces Pain Scale: Hurts little more Pain Location: abdomen, flanks with movement Pain Descriptors / Indicators: Grimacing;Guarding;Sharp Pain Intervention(s): Limited activity within patient's tolerance;Monitored during session;Repositioned  Home Living                                          Prior Functioning/Environment              Frequency  Min 2X/week        Progress Toward Goals  OT Goals(current goals can now be found in the care plan section)  Progress towards OT goals: Progressing toward goals     Plan Discharge plan remains appropriate    Co-evaluation                 AM-PAC OT "6 Clicks" Daily Activity     Outcome Measure   Help from another person eating meals?: A Little Help from another person taking care of personal grooming?: A Little Help from another person toileting, which includes using toliet, bedpan, or urinal?: Total Help from another person bathing (including washing, rinsing, drying)?: A Lot Help from another person to put on and taking off regular upper body clothing?: A Lot Help from another person to put on and taking off regular lower body clothing?: Total 6 Click Score: 12    End of Session    OT Visit Diagnosis: Unsteadiness on feet (R26.81);Other abnormalities of gait and mobility  (R26.89);History of falling (Z91.81);Muscle weakness (generalized) (M62.81);Pain   Activity Tolerance Patient tolerated treatment well   Patient Left in bed;with call bell/phone within reach;with bed alarm set   Nurse Communication Mobility status        Time: 5248-1859 OT Time Calculation (min): 33 min  Charges: OT General Charges $OT Visit: 1 Visit OT Treatments $Self Care/Home Management : 23-37 mins  Jackelyn Poling OTR/L, MS Acute Rehabilitation Department Office# [phone] Pager# [phone]   Marcellina Millin 04/27/2021, 3:06 PM

## 2021-04-27 NOTE — Progress Notes (Signed)
Report given to [facility].  Pt medicated prior to transport.  Pt transported to room 1428 via bed with all belongings - pt states pain tolerable during transfer.

## 2021-04-27 NOTE — Progress Notes (Signed)
Nutrition Follow-up  DOCUMENTATION CODES:   Obesity unspecified  INTERVENTION:  - continue Ensure Enlive BID.    NUTRITION DIAGNOSIS:   Inadequate oral intake related to acute illness as evidenced by per patient/family report. -improving  GOAL:   Patient will meet greater than or equal to 90% of their needs -minimally met on average  MONITOR:   PO intake, Supplement acceptance, Labs, Weight trends   ASSESSMENT:   35 year old female with medical history of HTN, sinus tachycardia, obesity, anemia, OSA, and diverticulosis with complicated diverticulitis and abscess formation 11/2020 s/p IR guided placement of 2 left lower quadrant drains. She presented to the ED due to progressive generalized weakness x2 months and lightheadedness. She also reported poor oral intake PTA and gradual weight loss. She was seen by a Gastroenterologist on 10/6 with plan to undergo endoscopic work-up to rule out malignancy.  TPN was stopped on 11/19. Diet advanced from FLD to Soft on 11/16 at 0840 and to Carb Modified today at 0912.  Patient laying in bed on the phone with her mom at the time of RD visit. She shares that she has been doing well with soft foods and that Ensure has been very helpful, especially when she is not able to eat as much at a meal.   The food that caused her the most discomfort so far was beans. She reports increased cramping, pain, and gas in colostomy when she ate them. She ordered a late lunch of an egg salad sandwich on white bread that had not yet arrived at the time of RD visit.   Dinner on 11/17: 367 kcal and 12 grams protein Breakfast on 11/18: 103 kcal and 7 grams protein Dinner 11/18: 50 kcal and 1 gram protein Breakfast 11/19: 202 kcal and 7 grams protein Lunch 11/19: 94 kcal and 1 gram protein Dinner 11/19: 204 kcal and 3.5 grams protein Breakfast 11/20: 258 kcal and 13 grams protein Lunch 11/20: 332 kcal and 7 grams protein Dinner 11/20: 137 kcal and 2 grams  protein    Patient and RD talked about trying a variety of foods while in the hospital so she can determine how they may impact her ostomy output and what may cause discomfort for her.  Weight stable 11/9-11/18; she has not been weighed since 11/18. Non-pitting edema to all extremities documented in the edema section of flow sheet.    Labs reviewed; CBGs: 97, 82, 106 mg/dl, K: 3.4 mmol/l, Ca: 8.3 mg/dl, Phos: 4.9 mg/dl.   Medications reviewed; 40 mg IV lasix/day, sliding scale novolog, 40 mg oral protonix/day, 40 mEq Klor-Con x2 doses 11/21.    Diet Order:   Diet Order             Diet Carb Modified Fluid consistency: Thin; Room service appropriate? Yes  Diet effective now                   EDUCATION NEEDS:   Education needs have been addressed  Skin:  Skin Assessment: Skin Integrity Issues: Skin Integrity Issues:: Other (Comment), Incisions, Wound VAC Wound Vac: abdomen Incisions: abdomen (11/9) Other: open pressure sore vs MASD to L buttocks  Last BM:  11/20 (type 6, large amount) via colostomy  Height:   Ht Readings from Last 1 Encounters:  04/15/21 $RemoveB'5\' 3"'uOZmyEry$  (1.6 m)    Weight:   Wt Readings from Last 1 Encounters:  04/24/21 102.4 kg     Estimated Nutritional Needs:  Kcal:  2150-2400 kcal Protein:  110-125 grams Fluid:  >/=  2.2 L/day      Jarome Matin, MS, RD, LDN, CNSC Inpatient Clinical Dietitian RD pager # available in AMION  After hours/weekend pager # available in [facility]

## 2021-04-27 NOTE — Progress Notes (Signed)
PROGRESS NOTE    Mikayla Rios  DZH:[id number] DOB: 1986/03/11 DOA: 04/10/2021 PCP: [name], MD   Brief Narrative:  The patient is a 35 year old obese [name] female with a past medical history significant for [place] to hypertension, inappropriate sinus tachycardia, obesity, obstructive sleep apnea, history of diverticulosis with complicated diverticulitis, history of abscess formation on 622 22 status post IR guided placement to left lower quadrant drains who presented to [facility] long ED with complaints of generalized weakness and lightheadedness.  She is found to have any abscess measuring about 8.5 x 10 cm extending into the left lateral abdominal wall.  She underwent drain exchange and upsizing by IR on Monday and then subsequently underwent a colectomy and colostomy with small bowel resection and retroperitoneal abscess drainage on 04/15/2021.  Postoperatively she was monitored in the ICU the vent and briefly required pressors.  She is extubated 04/16/2021 and has since been weaned off of pressors.  She was transferred back to [facility] on 04/18/2021 CIR is evaluating her for placement.  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  Medical oncology has been consulted and they feel that she would be benefit and from post adjuvant chemotherapy and perhaps focal radiation therapy.  She is will be discussed at the tumor board.  There will be consideration of Port-A-Cath placement while she is here and general surgery feels that they are unlikely attempt colostomy takedown and anastomosis for least a year or so until there is no local recurrence or metastatic disease.  She continues to be tachycardic but states that her heart rate has always been over 100.  Blood count continues to drop so we will type and screen under 2 units today to make a total of 10 units to be transfused.  Her diet has been advanced to soft diet and will obtain a calorie count to evaluate how much she is eating as she  is also being continued on TPN for the time being but has now been discontinued by general surgery.  Surgery evaluated and recommending wound VAC change and this was done today and recommends removing right-sided drain prior to discharge to CIR.  Given that her WBC remains mildly elevated they also considering repeating CT scan prior to discharge but are holding off for now given that WBC is trending down. Patient's ostomy is functioning and pain regimen is being adjusted.   Assessment & Plan:   Principal Problem:   Diverticulitis of large intestine with abscess Active Problems:   Class 3 severe obesity due to excess calories with serious comorbidity and body mass index (BMI) of 60.0 to 69.9 in adult [facility])   Obstructive sleep apnea   Anemia   Inappropriate sinus tachycardia   Essential hypertension   SIRS (systemic inflammatory response syndrome) (HCC)   Hypokalemia, inadequate intake   Protein-calorie malnutrition, severe (HCC)   Reactive thrombocytosis   Prolonged QT interval   Malignant neoplasm of sigmoid colon ([place])  Complicated diverticulitis with abscess s/p drain placement by IR twice in June 2022 with numerous exchanges since then due to persistent fistulous -CT of the abdomen and pelvis New lobulated enhancing fluid collection posterior to this segment of inflamed colon measuring 8.5 x 3.5 x 10.0 cm. This collection now invades the adjacent iliopsoas muscle as well as extends through the left lateral abdominal wall. -Patient has had prolonged treatment with antibiotics and multiple drain placements, but despite conservative measures, she has failed to improve and now has a new abscess -Seen by general surgery and  she underwent colectomy/colostomy and drainage of retroperitoneal abscess -She is completed a course of IV antibiotics -Has been removed and diet is being advanced per general surgery -She has wound VAC over abdominal wound -Defer further postop care to surgery and  they had a long discussion with the patient about her adenocarcinoma -Diet has been advanced to a soft diet and surgeries continue TPN for now -We will order a calorie count to evaluate and she is now off of TPN.  Calorie count results are still pending -General surgery evaluated and recommending considering repeating CT scan of the abdomen pelvis prior to discharge to CIR and removing the drain prior to CIR -She is going to get a wound VAC change today and it gets changed MWF   Septic shock -Secondary to retroperitoneal abscess -Patient required vasopressor therapy postoperatively, but has been able to wean off pressors -Blood pressures now elevated -WBC has trended down and went from 13.4 -> 11.3 -> 11.6 -> 11.5 but is now trending up and went to 12.7 and is further elevated 14.2 and yesterday it is 14.1 -> 13.2 -She has completed antibiotics -As above she is not febrile and has bowel function but general surgery considering repeating CT scan of the abdomen pelvis prior to discharge and changing her wound VAC tomorrow and removing the right-sided drain is in place prior to discharge   Acute respiratory failure with hypoxia requiring mechanical ventilation -Patient was intubated for operative management and due to hemodynamic instability postoperatively, decision was made to leave the patient intubated and monitored in the ICU -She was able to extubate on 11/10 and respiratory status has been stable -Continue with pulmonary toileting and continue monitor respiratory status carefully   Adenocarcinoma colon -New diagnosis; 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  -Medical oncology consulted and they discussed the biopsy results with her and they feel that she will  need to heal from her surgery.  She is planning going to CIR and when she is recovered we will consider systemic chemotherapy for her and may consider getting a genetics counseling as an outpatient   Mechanical Fall:  -Patient reports dizziness and an episode of syncope and an episode of mechanical fall possibly from orthostatic hypotension.  -Monitor on telemetry. -CT angiogram of the chest ruled out PE -EKG shows prolonged QT interval 636.  Nonspecific T wave inversions.troponin is negative. Patient denies any chest pain or shortness of breath. -Probably secondary to orthostatic hypotension from infection. -Overall QTC has improved -PT OT recommending CIR   Hypokalemia Hypophosphatemia -Now off of TPN -Potassium is 3.4 and mag level is 2.0 -Replete with with po Kcl 40 mEQ BID x2  -Continue to monitor and replete as necessary   Prolonged QTc: -From hypokalemia and hypomagnesemia. -K is 3.4 today. magnesium is 1.8. -Keep k >4 and mag>2.  -Repeat EKG shows improvement in the qtC.     Anemia of chronic disease/ Acute anemia of acute illness -Normocytic.  -Baseline creatinine appears to be between 7-8 -Patient noted to have significant bleeding in ostomy -She was transfused a total of 8 units PRBCs thus far and will receive another 2 units today; she was transfused a total of 10 units -Hgb/hematocrit dropped again and now improved from 6.5/20.8 and trended up to 9.7/29.8 and today is 9.9/30.7 ->  9.7/30.0; we will type and screen and transfuse 2 more units and continue Lasix 40 mg twice daily -Continue to follow CBC -Transfuse to keep hemoglobin greater than 7.   Post op bleeding from ostomy -concern it may be bleeding at small bowel anastomosis -defer further intervention if needed to general surgery -She received 2 units of FFP on 11/13 -Overall bleeding seems to be improving -continue to follow hemoglobin and supportive care with PRBC transfusions -See above    Anasarca -related to hypoalbuminemia and aggressive volume repletion as well as blood transfusions -Received albumin infusion and IV lasix 20 mg twice daily with good urine output -Swelling appears to be improving however upon further review she was +16.449 L since admission the day before yesterday so we increased Lasix to 40 mg twice daily and continue; today she has + 2.616 L and will go to IV 40 mg Daily now    Hypoalbuminemia  -will get dietary consult.  -currently on TPN was n.p.o. but diet was advanced to soft.  We will obtain a calorie count and this is day 2 of Calorie Count is complete and awaiting resuls -Albumin Level was 2.5 x2   Sinus Tachycardia, stable  -Probably from anemia and infection and pain.  -Pt not symptomatic.  Heart rate is ranging from 110s to 140s still and still remain persistently elevated -Echocardiogram unremarkable -Transfuse 2 units of pRBCs as above  -Continue on IV Lopressor and was transitioned to 12.5 mg p.o. twice daily and will go to 25 mg p.o. twice daily -She does have a history of inappropriate sinus tachycardia and has been worked up by cardiology in outpatient setting by Dr. Gardiner Rhyme -Continue monitor heart rates clinically   Hypertension  Currently on IV lopressor and prn hydralazine -Restart on oral Lopressor 25 mg po BID -Continue to monitor blood pressures per protocol -Last blood pressure reading was 115/82  Thrombocytosis -Patient's platelet count went from 421 -> 433 -> 538 -> 709 and is now 652 -> 716 -Continue to monitor and trend and repeat CBC in a.m.   DVT prophylaxis: SCDs Code Status: Full code Family Communication: No family currently at bedside discussed with family at bedside Disposition Plan: Pending further clinical improvement; Once blood count stablizes she can be D/C'd to CIR; will transfer to the medical floor given that she is stable from a ICU perspective  Status is: Inpatient  Remains inpatient appropriate  because: She is not yet back to her baseline and will need to tolerate her diet and be off the TPN for safe discharge disposition as she will need to also go to CIR  Consultants:  PCCM transfer General surgery Medical oncology Cardiology Gastroenterology  Procedures:  Laparoscopic left colectomy/end colostomy, small bowel resection, left retroperitoneal/flank abscess drainage   ETT 11/09>11/10  LE Venous Duplex   Antimicrobials:  Anti-infectives (From admission, onward)    Start     Dose/Rate Route Frequency Ordered Stop   04/17/21 1700  ceFEPIme (MAXIPIME) 2 g in sodium chloride 0.9 % 100 mL IVPB  Status:  Discontinued        2 g 200 mL/hr over 30 Minutes Intravenous Every 8 hours 04/17/21 1530 04/21/21 0808   04/17/21 1700  metroNIDAZOLE (FLAGYL) IVPB 500 mg  Status:  Discontinued        500 mg 100 mL/hr over 60 Minutes Intravenous Every 12 hours 04/17/21 1530 04/21/21 0808   04/15/21 1000  anidulafungin (ERAXIS) 100 mg in sodium chloride 0.9 % 100 mL IVPB  Status:  Discontinued        100 mg 78 mL/hr over 100 Minutes Intravenous Every 24 hours 04/14/21 0744 04/21/21 0808   04/15/21 0900  clindamycin (CLEOCIN) IVPB 900 mg       See Hyperspace for full Linked Orders Report.   900 mg 100 mL/hr over 30 Minutes Intravenous 60 min pre-op 04/14/21 1405 04/15/21 1142   04/15/21 0900  gentamicin (GARAMYCIN) 340 mg in dextrose 5 % 100 mL IVPB       See Hyperspace for full Linked Orders Report.   5 mg/kg  67.7 kg (Adjusted) 108.5 mL/hr over 60 Minutes Intravenous 60 min pre-op 04/14/21 1405 04/15/21 1834   04/14/21 1000  anidulafungin (ERAXIS) 200 mg in sodium chloride 0.9 % 200 mL IVPB        200 mg 78 mL/hr over 200 Minutes Intravenous  Once 04/14/21 0755 04/14/21 1500   04/13/21 0900  meropenem (MERREM) 1 g in sodium chloride 0.9 % 100 mL IVPB  Status:  Discontinued        1 g 200 mL/hr over 30 Minutes Intravenous Every 8 hours 04/13/21 0800 04/17/21 1530   04/10/21 2200   cefTRIAXone (ROCEPHIN) 2 g in sodium chloride 0.9 % 100 mL IVPB  Status:  Discontinued        2 g 200 mL/hr over 30 Minutes Intravenous Daily at bedtime 04/10/21 2044 04/13/21 0746   04/10/21 2200  metroNIDAZOLE (FLAGYL) IVPB 500 mg  Status:  Discontinued        500 mg 100 mL/hr over 60 Minutes Intravenous Every 12 hours 04/10/21 2044 04/13/21 0745       Subjective: Seen and examined and she is doing well.  Was having pain but her pain regimen is being adjusted.  She denies any chest pain lightheadedness or dizziness.  Has been urinating but does not think has frequent as she has been.  No other concerns or complaints at this time.  Objective: Vitals:   04/26/21 2100 04/27/21 0024 04/27/21 0453 04/27/21 0917  BP: 112/85 116/83 105/62 115/82  Pulse: (!) 117 (!) 124 (!) 101 (!) 113  Resp: (!) 23 18 16 20   Temp:  98.4 F (36.9 C) 97.7 F (36.5 C) 98.4 F (36.9 C)  TempSrc:  Oral Oral Oral  SpO2: 97% 95% 100% 99%  Weight:      Height:        Intake/Output Summary (Last 24 hours) at 04/27/2021 1159 Last data filed at 04/27/2021 3086 Gross per 24 hour  Intake 70 ml  Output 2225 ml  Net -2155 ml    Filed Weights   04/15/21 1800 04/23/21 1752 04/24/21 0700  Weight: 99.5 kg 100.5 kg 102.4 kg   Examination: Physical Exam:  Constitutional: WN/WD morbidly obese African-American female in no acute distress  Eyes: Lids and conjunctivae normal, sclerae anicteric  ENMT: External Ears, Nose appear normal. Grossly normal hearing. Mucous membranes are moist.  Neck: Appears normal, supple, no cervical masses, normal ROM, no appreciable thyromegaly; No appreciable JVD Respiratory: Diminished to auscultation bilaterally, no wheezing, rales, rhonchi or crackles. Normal respiratory effort and patient is not tachypenic. No accessory muscle use.  Unlabored breathing Cardiovascular: Tachycardic rate but regular rhythm, no murmurs / rubs / gallops. S1 and S2 auscultated.  1+ lower extremity  edema Abdomen: Soft, non-tender, distended secondary body habitus and has a colostomy in place with wound VAC and abdominal drain. Bowel sounds positive.  GU: Deferred. Musculoskeletal: No clubbing / cyanosis of digits/nails. No joint deformity upper and  lower extremities. Skin: No rashes, lesions, ulcers on limited skin evaluation. No induration; Warm and dry.  Neurologic: CN 2-12 grossly intact with no focal deficits. Romberg sign and cerebellar reflexes not assessed.  Psychiatric: Normal judgment and insight. Alert and oriented x 3. Normal mood and appropriate affect.   Data Reviewed: I have personally reviewed following labs and imaging studies  CBC: Recent Labs  Lab 04/23/21 0522 04/24/21 0500 04/25/21 0530 04/25/21 2258 04/26/21 0515 04/27/21 0357  WBC 11.5* 12.7* 14.2*  --  14.1* 13.2*  NEUTROABS 9.2* 9.4* 10.9*  --  10.8* 9.9*  HGB 6.9* 7.5* 6.5* 9.7* 9.9* 9.7*  HCT 21.7* 23.7* 20.8* 29.8* 30.7* 30.0*  MCV 91.6 91.2 93.7  --  86.7 88.5  PLT 433* 538* 709*  --  652* 716*    Basic Metabolic Panel: Recent Labs  Lab 04/23/21 0522 04/24/21 0500 04/25/21 0530 04/26/21 0515 04/27/21 0357  NA 136 135 136 134* 138  K 4.7 4.9 4.7 4.0 3.4*  CL 100 99 99 95* 98  CO2 30 31 31  32 32  GLUCOSE 100* 92 106* 93 89  BUN 27* 22* 26* 18 15  CREATININE <0.30* <0.30* 0.35* 0.41* 0.55  CALCIUM 8.2* 8.3* 8.5* 8.3* 8.3*  MG 2.0 1.9 2.1 1.8 2.0  PHOS 3.8 4.6 5.3* 5.5* 4.9*    GFR: Estimated Creatinine Clearance: 110 mL/min (by C-G formula based on SCr of 0.55 mg/dL). Liver Function Tests: Recent Labs  Lab 04/23/21 0522 04/24/21 0500 04/25/21 0530 04/26/21 0515 04/27/21 0357  AST 15 11* 12* 15 15  ALT 10 10 10 12 13   ALKPHOS 68 71 108 87 84  BILITOT 0.5 0.5 0.3 0.7 0.6  PROT 5.3* 5.5* 5.8* 6.4* 6.6  ALBUMIN 2.2* 2.2* 2.3* 2.5* 2.5*    No results for input(s): LIPASE, AMYLASE in the last 168 hours. No results for input(s): AMMONIA in the last 168 hours. Coagulation  Profile: No results for input(s): INR, PROTIME in the last 168 hours.  Cardiac Enzymes: No results for input(s): CKTOTAL, CKMB, CKMBINDEX, TROPONINI in the last 168 hours. BNP (last 3 results) No results for input(s): PROBNP in the last 8760 hours. HbA1C: No results for input(s): HGBA1C in the last 72 hours. CBG: Recent Labs  Lab 04/26/21 0514 04/26/21 1159 04/26/21 1803 04/27/21 0029 04/27/21 0646  GLUCAP 93 102* 83 97 82    Lipid Profile: No results for input(s): CHOL, HDL, LDLCALC, TRIG, CHOLHDL, LDLDIRECT in the last 72 hours.  Thyroid Function Tests: No results for input(s): TSH, T4TOTAL, FREET4, T3FREE, THYROIDAB in the last 72 hours. Anemia Panel: No results for input(s): VITAMINB12, FOLATE, FERRITIN, TIBC, IRON, RETICCTPCT in the last 72 hours.  Sepsis Labs: No results for input(s): PROCALCITON, LATICACIDVEN in the last 168 hours.   No results found for this or any previous visit (from the past 240 hour(s)).   RN Pressure Injury Documentation:     Estimated body mass index is 39.99 kg/m as calculated from the following:   Height as of this encounter: 5\' 3"  (1.6 m).   Weight as of this encounter: 102.4 kg.  Malnutrition Type:  Nutrition Problem: Inadequate oral intake Etiology: acute illness, inability to eat  Malnutrition Characteristics:  Signs/Symptoms: per patient/family report, NPO status  Nutrition Interventions:  Interventions: Ensure Enlive (each supplement provides 350kcal and 20 grams of protein), TPN   Radiology Studies: No results found.  Scheduled Meds:  acetaminophen  1,000 mg Oral Q6H   chlorhexidine  15 mL Mouth/Throat BID  Chlorhexidine Gluconate Cloth  6 each Topical Daily   diclofenac Sodium  4 g Topical QID   feeding supplement  237 mL Oral BID BM   furosemide  20 mg Intravenous Once   furosemide  40 mg Intravenous BID   insulin aspart  0-9 Units Subcutaneous Q6H   lip balm  1 application Topical BID   metoprolol tartrate   25 mg Oral BID   pantoprazole  40 mg Oral Daily   potassium chloride  40 mEq Oral BID   Continuous Infusions:  sodium chloride Stopped (04/19/21 1232)   methocarbamol (ROBAXIN) IV      LOS: 17 days   Kerney Elbe, DO [facility] PAGER is on AMION  If 7PM-7AM, please contact night-coverage [email or web address]

## 2021-04-27 NOTE — Progress Notes (Signed)
Inpatient Rehab Admissions Coordinator:     Pt. Is not yet medically ready for CIR. Pt. To have drain removed prior to d/c  to CIR. Anticipate she will be ready this week.   Clemens Catholic, [place], [place] Admissions Coordinator  [phone] ([place]) [phone] (office)

## 2021-04-27 NOTE — Progress Notes (Signed)
Physical Therapy Treatment Patient Details Name: Mikayla Rios MRN: [id number] DOB: 01/05/1986 Today's Date: 04/27/2021   History of Present Illness Mikayla Rios is a 35 yo female presents with complaints of generalized weakness and lightheadedness on 04/10/21. She was found to have any abscess  left lateral abdominal wall.  She underwent drain exchange and upsizing by IR on 11/7 and then subsequently underwent a colectomy and colostomy with small bowel resection and retroperitoneal abscess drainage on 04/15/2021.  Postoperatively she was monitored in the ICU the vent and briefly required pressors.  She is extubated 04/16/2021 and has since been weaned off of pressors. PMH: HTN, sinus tachy, obesity, obstructive sleep apnea and diverticulosis with complicated diverticulitis and abscess formation 11/2020 status post IR guided placement of 2 left lower quadrant drains.    PT Comments    Pt making gradual progress.  She required mod A of 2 and cues to stand from low surface but then able to ambulate up to 16' with chair follow and min guard.  HR up to 150's today with activity but quickly recovers.  Pt incontinent with activity - recommend brief or pad.  Continue plan of care.     Recommendations for follow up therapy are one component of a multi-disciplinary discharge planning process, led by the attending physician.  Recommendations may be updated based on patient status, additional functional criteria and insurance authorization.  Follow Up Recommendations  Acute inpatient rehab (3hours/day)     Assistance Recommended at Discharge Frequent or constant Supervision/Assistance  Equipment Recommendations  Rolling walker (2 wheels)    Recommendations for Other Services       Precautions / Restrictions Precautions Precautions: Fall Precaution Comments: monitor HR, colostomy, R drain, VAC     Mobility  Bed Mobility Overal bed mobility: Needs Assistance Bed Mobility: Rolling;Sidelying to  Sit Rolling: Mod assist Sidelying to sit: Mod assist;HOB elevated       General bed mobility comments: Increased time with cues for log roll techinque.  Pt able to get legs off edge of bed but needing assist for trunk    Transfers Overall transfer level: Needs assistance Equipment used: Rolling walker (2 wheels) Transfers: Sit to/from Stand Sit to Stand: Mod assist;+2 physical assistance           General transfer comment: Pt stood from elevated bed with min A of 1 with increased time, cues for momentum and hand placement.  However, from [employer] requiring mod A of 2, multiple attempts, and cues.    Ambulation/Gait Ambulation/Gait assistance: Min guard;+2 safety/equipment Gait Distance (Feet): 16 Feet (6' then 16') Assistive device: Rolling walker (2 wheels) Gait Pattern/deviations: Step-to pattern;Decreased stride length Gait velocity: decreased     General Gait Details: Ambulated 6' then rest break due to incontinent - sat on BSC.  Then ambulated 16'.  Pt with slow gait with chair follow , easily fatigued, but steady   Stairs             Wheelchair Mobility    Modified Rankin (Stroke Patients Only)       Balance Overall balance assessment: Needs assistance Sitting-balance support: No upper extremity supported Sitting balance-Leahy Scale: Good Sitting balance - Comments: seated EOB   Standing balance support: Bilateral upper extremity supported Standing balance-Leahy Scale: Poor Standing balance comment: reliant on walker                            Cognition Arousal/Alertness: Awake/alert Behavior During Therapy:  WFL for tasks assessed/performed Overall Cognitive Status: Within Functional Limits for tasks assessed                                          Exercises General Exercises - Lower Extremity Ankle Circles/Pumps: AROM;Both;10 reps Long Arc Quad: AROM;Both;10 reps;Seated Hip ABduction/ADduction: Strengthening;Both;10  reps;Seated (pillow squeeze) Hip Flexion/Marching: AAROM;Both;10 reps;Seated    General Comments General comments (skin integrity, edema, etc.): HR rest 120's-130's up to 150's with exercise but recovered in <2 mins.      Pertinent Vitals/Pain Pain Assessment: Faces Faces Pain Scale: Hurts even more Pain Location: abdomen, flanks with movement Pain Descriptors / Indicators: Grimacing;Guarding;Sharp;Cramping Pain Intervention(s): Limited activity within patient's tolerance;Monitored during session;Repositioned    Home Living                          Prior Function            PT Goals (current goals can now be found in the care plan section) Acute Rehab PT Goals Time For Goal Achievement: 05/11/21 Progress towards PT goals: Progressing toward goals    Frequency    Min 3X/week      PT Plan Current plan remains appropriate    Co-evaluation              AM-PAC PT "6 Clicks" Mobility   Outcome Measure  Help needed turning from your back to your side while in a flat bed without using bedrails?: A Lot Help needed moving from lying on your back to sitting on the side of a flat bed without using bedrails?: A Lot Help needed moving to and from a bed to a chair (including a wheelchair)?: Total Help needed standing up from a chair using your arms (e.g., wheelchair or bedside chair)?: Total Help needed to walk in hospital room?: A Lot Help needed climbing 3-5 steps with a railing? : Total 6 Click Score: 9    End of Session Equipment Utilized During Treatment: Gait belt Activity Tolerance: Patient tolerated treatment well Patient left: in chair;with call bell/phone within reach Nurse Communication: Mobility status PT Visit Diagnosis: Unsteadiness on feet (R26.81);Other abnormalities of gait and mobility (R26.89);Muscle weakness (generalized) (M62.81);Pain;Difficulty in walking, not elsewhere classified (R26.2)     Time: 1127-1204 PT Time Calculation (min)  (ACUTE ONLY): 37 min  Charges:  $Gait Training: 8-22 mins $Therapeutic Activity: 8-22 mins                     Abran Richard, PT Acute Rehab Services Pager [phone] [facility] [phone]    Karlton Lemon 04/27/2021, 12:24 PM

## 2021-04-27 NOTE — Plan of Care (Signed)
  Problem: Health Behavior/Discharge Planning: Goal: Ability to manage health-related needs will improve Outcome: Progressing   

## 2021-04-27 NOTE — Plan of Care (Signed)
  Problem: Activity: Goal: Risk for activity intolerance will decrease Outcome: Progressing   Problem: Coping: Goal: Level of anxiety will decrease Outcome: Progressing   Problem: Pain Managment: Goal: General experience of comfort will improve Outcome: Progressing   Problem: Education: Goal: Knowledge of General Education information will improve Description: Including pain rating scale, medication(s)/side effects and non-pharmacologic comfort measures Outcome: Completed/Met

## 2021-04-28 LAB — COMPREHENSIVE METABOLIC PANEL
ALT: 13 U/L (ref 0–44)
AST: 12 U/L — ABNORMAL LOW (ref 15–41)
Albumin: 2.4 g/dL — ABNORMAL LOW (ref 3.5–5.0)
Alkaline Phosphatase: 75 U/L (ref 38–126)
Anion gap: 9 (ref 5–15)
BUN: 14 mg/dL (ref 6–20)
CO2: 30 mmol/L (ref 22–32)
Calcium: 8.6 mg/dL — ABNORMAL LOW (ref 8.9–10.3)
Chloride: 101 mmol/L (ref 98–111)
Creatinine, Ser: 0.5 mg/dL (ref 0.44–1.00)
GFR, Estimated: >60 mL/min (ref >60)
Glucose, Bld: 93 mg/dL (ref 70–99)
Potassium: 3.5 mmol/L (ref 3.5–5.1)
Sodium: 140 mmol/L (ref 135–145)
Total Bilirubin: 0.4 mg/dL (ref 0.3–1.2)
Total Protein: 6.6 g/dL (ref 6.5–8.1)

## 2021-04-28 LAB — CBC WITH DIFFERENTIAL/PLATELET
Abs Immature Granulocytes: 0.16 10*3/uL — ABNORMAL HIGH (ref 0.00–0.07)
Basophils Absolute: 0.1 10*3/uL (ref 0.0–0.1)
Basophils Relative: 1 %
Eosinophils Absolute: 0.1 10*3/uL (ref 0.0–0.5)
Eosinophils Relative: 1 %
HCT: 30.5 % — ABNORMAL LOW (ref 36.0–46.0)
Hemoglobin: 9.6 g/dL — ABNORMAL LOW (ref 12.0–15.0)
Immature Granulocytes: 1 %
Lymphocytes Relative: 20 %
Lymphs Abs: 2.2 10*3/uL (ref 0.7–4.0)
MCH: 28.2 pg (ref 26.0–34.0)
MCHC: 31.5 g/dL (ref 30.0–36.0)
MCV: 89.7 fL (ref 80.0–100.0)
Monocytes Absolute: 0.7 10*3/uL (ref 0.1–1.0)
Monocytes Relative: 6 %
Neutro Abs: 7.9 10*3/uL — ABNORMAL HIGH (ref 1.7–7.7)
Neutrophils Relative %: 71 %
Platelets: 751 10*3/uL — ABNORMAL HIGH (ref 150–400)
RBC: 3.4 MIL/uL — ABNORMAL LOW (ref 3.87–5.11)
RDW: 16 % — ABNORMAL HIGH (ref 11.5–15.5)
WBC: 11.1 10*3/uL — ABNORMAL HIGH (ref 4.0–10.5)
nRBC: 0 % (ref 0.0–0.2)

## 2021-04-28 LAB — MAGNESIUM: Magnesium: 1.8 mg/dL (ref 1.7–2.4)

## 2021-04-28 LAB — PHOSPHORUS: Phosphorus: 4.2 mg/dL (ref 2.5–4.6)

## 2021-04-28 LAB — GLUCOSE, CAPILLARY
Glucose-Capillary: 87 mg/dL (ref 70–99)
Glucose-Capillary: 108 mg/dL — ABNORMAL HIGH (ref 70–99)
Glucose-Capillary: 107 mg/dL — ABNORMAL HIGH (ref 70–99)

## 2021-04-28 MED ORDER — POTASSIUM CHLORIDE CRYS ER 20 MEQ PO TBCR
40.0000 meq | EXTENDED_RELEASE_TABLET | Freq: Two times a day (BID) | ORAL | Status: AC
  Administered 2021-04-28 (×2): 40 meq via ORAL
  Filled 2021-04-28 (×2): qty 2

## 2021-04-28 MED ORDER — MAGNESIUM SULFATE 2 GM/50ML IV SOLN
2.0000 g | Freq: Once | INTRAVENOUS | Status: AC
  Administered 2021-04-28: 2 g via INTRAVENOUS
  Filled 2021-04-28: qty 50

## 2021-04-28 MED ORDER — HYDROMORPHONE HCL 1 MG/ML IJ SOLN
1.0000 mg | Freq: Two times a day (BID) | INTRAMUSCULAR | Status: DC | PRN
  Administered 2021-04-29: 1 mg via INTRAVENOUS
  Filled 2021-04-28: qty 1

## 2021-04-28 NOTE — Progress Notes (Signed)
Jp drain removed. Covered with 4x4 gauze and clear tape.

## 2021-04-28 NOTE — Progress Notes (Signed)
Inpatient Rehab Admissions Coordinator:   I do not have a bed for this Pt. On CIR today, but I'm hopeful to have a bed for her tomorrow, pending bed availability and medical readiness.    Clemens Catholic, [place], [place] Admissions Coordinator  [phone] ([place]) [phone] (office)

## 2021-04-28 NOTE — Progress Notes (Signed)
PROGRESS NOTE    Mikayla Rios  OZH:[id number] DOB: 1985/08/04 DOA: 04/10/2021 PCP: [name], MD   Brief Narrative:  The patient is a 35 year old obese [name] female with a past medical history significant for [place] to hypertension, inappropriate sinus tachycardia, obesity, obstructive sleep apnea, history of diverticulosis with complicated diverticulitis, history of abscess formation on 622 22 status post IR guided placement to left lower quadrant drains who presented to [facility] long ED with complaints of generalized weakness and lightheadedness.  She is found to have any abscess measuring about 8.5 x 10 cm extending into the left lateral abdominal wall.  She underwent drain exchange and upsizing by IR on Monday and then subsequently underwent a colectomy and colostomy with small bowel resection and retroperitoneal abscess drainage on 04/15/2021.  Postoperatively she was monitored in the ICU the vent and briefly required pressors.  She is extubated 04/16/2021 and has since been weaned off of pressors.  She was transferred back to [facility] on 04/18/2021 CIR is evaluating her for placement.  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  Medical oncology has been consulted and they feel that she would be benefit and from post adjuvant chemotherapy and perhaps focal radiation therapy.  She is will be discussed at the tumor board.  There will be consideration of Port-A-Cath placement while she is here and general surgery feels that they are unlikely attempt colostomy takedown and anastomosis for least a year or so until there is no local recurrence or metastatic disease.  She continues to be tachycardic but states that her heart rate has always been over 100.  Blood count continues to drop so we will type and screen under 2 units today to make a total of 10 units to be transfused.  Her diet has been advanced to soft diet and will obtain a calorie count to evaluate how much she is eating as she  is also being continued on TPN for the time being but has now been discontinued by general surgery.  Surgery evaluated and recommending wound VAC change and this was done 04/27/21 and recommends removing right-sided drain prior to discharge to CIR.  Given that her WBC remains mildly elevated they also considering repeating CT scan prior to discharge but are holding off for now given that WBC is trending down. Patient's ostomy is functioning and pain regimen is being adjusted.   Assessment & Plan:   Principal Problem:   Diverticulitis of large intestine with abscess Active Problems:   Class 3 severe obesity due to excess calories with serious comorbidity and body mass index (BMI) of 60.0 to 69.9 in adult [facility])   Obstructive sleep apnea   Anemia   Inappropriate sinus tachycardia   Essential hypertension   SIRS (systemic inflammatory response syndrome) (HCC)   Hypokalemia, inadequate intake   Protein-calorie malnutrition, severe (HCC)   Reactive thrombocytosis   Prolonged QT interval   Malignant neoplasm of sigmoid colon ([place])  Complicated diverticulitis with abscess s/p drain placement by IR twice in June 2022 with numerous exchanges since then due to persistent fistulous -CT of the abdomen and pelvis New lobulated enhancing fluid collection posterior to this segment of inflamed colon measuring 8.5 x 3.5 x 10.0 cm. This collection now invades the adjacent iliopsoas muscle as well as extends through the left lateral abdominal wall. -Patient has had prolonged treatment with antibiotics and multiple drain placements, but despite conservative measures, she has failed to improve and now has a new abscess -Seen by general surgery and  she underwent colectomy/colostomy and drainage of retroperitoneal abscess -She is completed a course of IV antibiotics -Has been removed and diet is being advanced per general surgery -She has wound VAC over abdominal wound -Defer further postop care to surgery and  they had a long discussion with the patient about her adenocarcinoma -Diet has been advanced to a soft diet and surgeries continue TPN for now -We will order a calorie count to evaluate and she is now off of TPN.  Calorie count results are still pending -General surgery evaluated and recommending considering repeating CT scan of the abdomen pelvis prior to discharge to CIR and removing the drain prior to CIR.  Her drain was removed 04/28/2021 and surgery feels no indication to repeat her scan given that she is tolerating her diet and that her colostomy is functioning with decreasing white count. -She received wound VAC change yesterday and is to change Monday Wednesday Friday -She is stable to be discharged to CIR once bed is available   Septic shock -Secondary to retroperitoneal abscess -Patient required vasopressor therapy postoperatively, but has been able to wean off pressors -Blood pressures now elevated -WBC has trended down and went from 13.4 -> 11.3 -> 11.6 -> 11.5 but is now trending up and went to 12.7 and is further elevated 14.2 and then went to 14.1 -> 13.2 -> 11.1 -She has completed antibiotics -As above she is not febrile and has bowel function but general surgery considering repeating CT scan of the abdomen pelvis prior to discharge and changing her wound VAC tomorrow and removing the right-sided drain is in place prior to discharge   Acute respiratory failure with hypoxia requiring mechanical ventilation -Patient was intubated for operative management and due to hemodynamic instability postoperatively, decision was made to leave the patient intubated and monitored in the ICU -She was able to extubate on 11/10 and respiratory status has been stable -Continue with pulmonary toileting and continue monitor respiratory status carefully   Adenocarcinoma colon -New diagnosis; 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  -Medical oncology consulted and they discussed the biopsy results with her and they feel that she will need to heal from her surgery.  She is planning going to CIR and when she is recovered we will consider systemic chemotherapy for her and may consider getting a genetics counseling as an outpatient   Mechanical Fall:  -Patient reports dizziness and an episode of syncope and an episode of mechanical fall possibly from orthostatic hypotension.  -Monitor on telemetry. -CT angiogram of the chest ruled out PE -EKG shows prolonged QT interval 636.  Nonspecific T wave inversions.troponin is negative. Patient denies any chest pain or shortness of breath. -Probably secondary to orthostatic hypotension from infection. -Overall QTC has improved -PT OT recommending CIR   Hypokalemia Hypophosphatemia -Now off of TPN -Potassium is 3.5 and mag level is 1.8 -Replete with with po Kcl 40 mEQ BID x2 and with Mag sulfate 2 grams -Continue to monitor and replete as necessary   Prolonged QTc: -From hypokalemia and hypomagnesemia. -K is 3.5 today. magnesium is 1.8. -Keep k >4 and mag>2.  -Repeat EKG shows improvement in the qtC.     Anemia of chronic disease/ Acute anemia of acute illness -Normocytic.  -Baseline creatinine appears to be between 7-8 -Patient noted  to have significant bleeding in ostomy -She was transfused a total of 8 units PRBCs thus far and will receive another 2 units today; she was transfused a total of 10 units -Hgb/hematocrit dropped again and now improved from 6.5/20.8 and trended up to 9.7/29.8 ->  9.9/30.7 -> 9.7/30.0;  -Continued Lasix 40 mg twice daily but changed to daily today and will re-evaluate in the AM for need  -Continue to follow CBC -Transfuse to keep hemoglobin greater than 7.   Post op bleeding  from ostomy -concern it may be bleeding at small bowel anastomosis -defer further intervention if needed to general surgery -She received 2 units of FFP on 11/13 -Overall bleeding seems to be improving -continue to follow hemoglobin and supportive care with PRBC transfusions -See above   Anasarca, improving  -related to hypoalbuminemia and aggressive volume repletion as well as blood transfusions -Received albumin infusion and IV lasix 20 mg twice daily with good urine output -Swelling appears to be improving however upon further review she was +16.449 L since admission so we increased Lasix to 40 mg twice daily and continued and changed to IV 40 mg Daily; today she has + 1.326 Liters and can likely stop Lasix in the AM as she is appearing more euvolemic    Hypoalbuminemia  -will get dietary consult.  -currently on TPN was n.p.o. but diet was advanced to soft.  We will obtain a calorie count and this is day 2 of Calorie Count is complete and awaiting resuls -Albumin Level was 2.5 x2 -> 2.4   Sinus Tachycardia, stable persistently elevated -Probably from anemia and infection and pain.  -Pt not symptomatic.  Heart rate is ranging from 110s to 140s still and still remain persistently elevated -Echocardiogram unremarkable -Transfuse 2 units of pRBCs as above  -Continue on IV Lopressor and was transitioned to 12.5 mg p.o. twice daily and will go to 25 mg p.o. twice daily -She does have a history of inappropriate sinus tachycardia and has been worked up by cardiology in outpatient setting by Dr. Gardiner Rhyme -Continue monitor heart rates clinically -Follow-up with cardiology in outpatient setting   Hypertension  Currently on IV lopressor and prn hydralazine -Restart on oral Lopressor 25 mg po BID -Continue to monitor blood pressures per protocol -Last blood pressure reading was 122/88  Thrombocytosis -Patient's platelet count went from 421 -> 433 -> 538 -> 709 and is now 652 ->  716 -Continue to monitor and trend and repeat CBC in a.m.  Obesity -Complicates overall prognosis and care -Estimated body mass index is 39.99 kg/m as calculated from the following:   Height as of this encounter: 5\' 3"  (1.6 m).   Weight as of this encounter: 102.4 kg. -Weight Loss and Dietary Counseling given -Nutritionist consulted and recommending Ensure Enlive po BID   DVT prophylaxis: SCDs Code Status: Full code Family Communication: Discussed with Husband at bedside  Disposition Plan: Pending further clinical improvement; Once blood count stablizes she can be D/C'd to CIR; will transfer to the medical floor given that she is stable from a ICU perspective  Status is: Inpatient  Remains inpatient appropriate because: She is not yet back to her baseline and will need to tolerate her diet and be off the TPN for safe discharge disposition as she will need to also go to CIR  Consultants:  PCCM transfer General surgery Medical oncology Cardiology Gastroenterology  Procedures:  Laparoscopic left colectomy/end colostomy, small bowel resection, left retroperitoneal/flank abscess drainage   ETT 11/09>11/10  LE  Venous Duplex   Antimicrobials:  Anti-infectives (From admission, onward)    Start     Dose/Rate Route Frequency Ordered Stop   04/17/21 1700  ceFEPIme (MAXIPIME) 2 g in sodium chloride 0.9 % 100 mL IVPB  Status:  Discontinued        2 g 200 mL/hr over 30 Minutes Intravenous Every 8 hours 04/17/21 1530 04/21/21 0808   04/17/21 1700  metroNIDAZOLE (FLAGYL) IVPB 500 mg  Status:  Discontinued        500 mg 100 mL/hr over 60 Minutes Intravenous Every 12 hours 04/17/21 1530 04/21/21 0808   04/15/21 1000  anidulafungin (ERAXIS) 100 mg in sodium chloride 0.9 % 100 mL IVPB  Status:  Discontinued        100 mg 78 mL/hr over 100 Minutes Intravenous Every 24 hours 04/14/21 0744 04/21/21 0808   04/15/21 0900  clindamycin (CLEOCIN) IVPB 900 mg       See Hyperspace for full Linked  Orders Report.   900 mg 100 mL/hr over 30 Minutes Intravenous 60 min pre-op 04/14/21 1405 04/15/21 1142   04/15/21 0900  gentamicin (GARAMYCIN) 340 mg in dextrose 5 % 100 mL IVPB       See Hyperspace for full Linked Orders Report.   5 mg/kg  67.7 kg (Adjusted) 108.5 mL/hr over 60 Minutes Intravenous 60 min pre-op 04/14/21 1405 04/15/21 1834   04/14/21 1000  anidulafungin (ERAXIS) 200 mg in sodium chloride 0.9 % 200 mL IVPB        200 mg 78 mL/hr over 200 Minutes Intravenous  Once 04/14/21 0755 04/14/21 1500   04/13/21 0900  meropenem (MERREM) 1 g in sodium chloride 0.9 % 100 mL IVPB  Status:  Discontinued        1 g 200 mL/hr over 30 Minutes Intravenous Every 8 hours 04/13/21 0800 04/17/21 1530   04/10/21 2200  cefTRIAXone (ROCEPHIN) 2 g in sodium chloride 0.9 % 100 mL IVPB  Status:  Discontinued        2 g 200 mL/hr over 30 Minutes Intravenous Daily at bedtime 04/10/21 2044 04/13/21 0746   04/10/21 2200  metroNIDAZOLE (FLAGYL) IVPB 500 mg  Status:  Discontinued        500 mg 100 mL/hr over 60 Minutes Intravenous Every 12 hours 04/10/21 2044 04/13/21 0745       Subjective: Seen and examined and was doing well but states her abdomen was a little sore today.  She denied any chest pain or lightheadedness or dizziness.  Had her abdominal JP drain removed today.  Feels well anticipating discharge to CIR next 24 to 48 hours..  She is improved.  Objective: Vitals:   04/28/21 0010 04/28/21 0435 04/28/21 1202 04/28/21 1310  BP: 122/85 118/87 (!) 142/90 122/88  Pulse: (!) 108 (!) 106 (!) 116 (!) 111  Resp: 18 20  20   Temp: 98.2 F (36.8 C) 98.3 F (36.8 C)  99.2 F (37.3 C)  TempSrc: Oral Oral  Oral  SpO2: 100% 98%  99%  Weight:      Height:        Intake/Output Summary (Last 24 hours) at 04/28/2021 1326 Last data filed at 04/28/2021 6314 Gross per 24 hour  Intake 160 ml  Output 650 ml  Net -490 ml    Filed Weights   04/15/21 1800 04/23/21 1752 04/24/21 0700  Weight: 99.5 kg  100.5 kg 102.4 kg   Examination: Physical Exam:  Constitutional: WN/WD obese African-American female currently in no acute distress appears  calm Eyes: Lids and conjunctivae normal, sclerae anicteric  ENMT: External Ears, Nose appear normal. Grossly normal hearing. Mucous membranes are moist.  Neck: Appears normal, supple, no cervical masses, normal ROM, no appreciable thyromegaly; no appreciable JVD Respiratory: Diminished to auscultation bilaterally with coarse breath sounds, no wheezing, rales, rhonchi or crackles. Normal respiratory effort and patient is not tachypenic. No accessory muscle use.  Unlabored breathing and not wearing any supplemental oxygen via nasal cannula Cardiovascular: Tachycardic rate with a heart rate in the 120s 130s, no murmurs / rubs / gallops. S1 and S2 auscultated.  1+ extremity edema Abdomen: Soft, non-tender, Distended. Ostomy in place and has a Wound VAC. Bowel sounds positive.  GU: Deferred. Musculoskeletal: No clubbing / cyanosis of digits/nails. No joint deformity upper and lower extremities.  Skin: No rashes, lesions, ulcers on a limited skin evaluation. No induration; Warm and dry.  Neurologic: CN 2-12 grossly intact with no focal deficits. Romberg sign and cerebellar reflexes not assessed.  Psychiatric: Normal judgment and insight. Alert and oriented x 3. Normal mood and appropriate affect.   Data Reviewed: I have personally reviewed following labs and imaging studies  CBC: Recent Labs  Lab 04/24/21 0500 04/25/21 0530 04/25/21 2258 04/26/21 0515 04/27/21 0357 04/28/21 0449  WBC 12.7* 14.2*  --  14.1* 13.2* 11.1*  NEUTROABS 9.4* 10.9*  --  10.8* 9.9* 7.9*  HGB 7.5* 6.5* 9.7* 9.9* 9.7* 9.6*  HCT 23.7* 20.8* 29.8* 30.7* 30.0* 30.5*  MCV 91.2 93.7  --  86.7 88.5 89.7  PLT 538* 709*  --  652* 716* 751*    Basic Metabolic Panel: Recent Labs  Lab 04/24/21 0500 04/25/21 0530 04/26/21 0515 04/27/21 0357 04/28/21 0449  NA 135 136 134* 138 140   K 4.9 4.7 4.0 3.4* 3.5  CL 99 99 95* 98 101  CO2 31 31 32 32 30  GLUCOSE 92 106* 93 89 93  BUN 22* 26* 18 15 14   CREATININE <0.30* 0.35* 0.41* 0.55 0.50  CALCIUM 8.3* 8.5* 8.3* 8.3* 8.6*  MG 1.9 2.1 1.8 2.0 1.8  PHOS 4.6 5.3* 5.5* 4.9* 4.2    GFR: Estimated Creatinine Clearance: 110 mL/min (by C-G formula based on SCr of 0.5 mg/dL). Liver Function Tests: Recent Labs  Lab 04/24/21 0500 04/25/21 0530 04/26/21 0515 04/27/21 0357 04/28/21 0449  AST 11* 12* 15 15 12*  ALT 10 10 12 13 13   ALKPHOS 71 108 87 84 75  BILITOT 0.5 0.3 0.7 0.6 0.4  PROT 5.5* 5.8* 6.4* 6.6 6.6  ALBUMIN 2.2* 2.3* 2.5* 2.5* 2.4*    No results for input(s): LIPASE, AMYLASE in the last 168 hours. No results for input(s): AMMONIA in the last 168 hours. Coagulation Profile: No results for input(s): INR, PROTIME in the last 168 hours.  Cardiac Enzymes: No results for input(s): CKTOTAL, CKMB, CKMBINDEX, TROPONINI in the last 168 hours. BNP (last 3 results) No results for input(s): PROBNP in the last 8760 hours. HbA1C: No results for input(s): HGBA1C in the last 72 hours. CBG: Recent Labs  Lab 04/27/21 1158 04/27/21 1832 04/27/21 2357 04/28/21 0603 04/28/21 1235  GLUCAP 106* 100* 96 87 108*    Lipid Profile: No results for input(s): CHOL, HDL, LDLCALC, TRIG, CHOLHDL, LDLDIRECT in the last 72 hours.  Thyroid Function Tests: No results for input(s): TSH, T4TOTAL, FREET4, T3FREE, THYROIDAB in the last 72 hours. Anemia Panel: No results for input(s): VITAMINB12, FOLATE, FERRITIN, TIBC, IRON, RETICCTPCT in the last 72 hours.  Sepsis Labs: No results for input(s):  PROCALCITON, LATICACIDVEN in the last 168 hours.   No results found for this or any previous visit (from the past 240 hour(s)).   RN Pressure Injury Documentation:     Estimated body mass index is 39.99 kg/m as calculated from the following:   Height as of this encounter: 5\' 3"  (1.6 m).   Weight as of this encounter: 102.4  kg.  Malnutrition Type:  Nutrition Problem: Inadequate oral intake Etiology: acute illness  Malnutrition Characteristics:  Signs/Symptoms: per patient/family report  Nutrition Interventions:  Interventions: Ensure Enlive (each supplement provides 350kcal and 20 grams of protein)   Radiology Studies: No results found.  Scheduled Meds:  acetaminophen  1,000 mg Oral Q6H   chlorhexidine  15 mL Mouth/Throat BID   Chlorhexidine Gluconate Cloth  6 each Topical Daily   diclofenac Sodium  4 g Topical QID   feeding supplement  237 mL Oral BID BM   furosemide  40 mg Intravenous Daily   insulin aspart  0-9 Units Subcutaneous Q6H   lip balm  1 application Topical BID   metoprolol tartrate  25 mg Oral BID   pantoprazole  40 mg Oral Daily   Continuous Infusions:  sodium chloride 10 mL/hr at 04/28/21 0445   methocarbamol (ROBAXIN) IV      LOS: 18 days   Kerney Elbe, DO [facility] PAGER is on AMION  If 7PM-7AM, please contact night-coverage [email or web address]

## 2021-04-28 NOTE — TOC Progression Note (Signed)
Transition of Care [facility]) - Progression Note    Patient Details  Name: Mikayla Rios MRN: [id number] Date of Birth: 1985-07-27  Transition of [facility] CM/SW Contact  Purcell Mouton, RN Phone Number: 04/28/2021, 2:50 PM  Clinical Narrative:    Spoke with pt concerning CIR. Pt states that CIR plan to take her in the AM with Wound VAC. Pt's husband was at bedside asleep.    Expected Discharge Plan: Home/Self Care Barriers to Discharge: Continued Medical Work up  Expected Discharge Plan and Services Expected Discharge Plan: Home/Self Care   Discharge Planning Services: CM Consult   Living arrangements for the past 2 months: Apartment                                       Social Determinants of Health (SDOH) Interventions    Readmission Risk Interventions No flowsheet data found.

## 2021-04-28 NOTE — Progress Notes (Signed)
Patient ID: Mikayla Rios, female   DOB: 1985-08-18, 35 y.o.   MRN: [id number] [facility] Progress Note  13 Days Post-Op  Subjective: CC-  States that she had some increased abdominal pain after ambulating yesterday, but pain medication helped. She is tolerating diet and colostomy functioning. Denies n/v. WBC down 11.1, afebrile.  Objective: Vital signs in last 24 hours: Temp:  [98.2 F (36.8 C)-98.7 F (37.1 C)] 98.3 F (36.8 C) (11/22 0435) Pulse Rate:  [106-113] 106 (11/22 0435) Resp:  [18-20] 20 (11/22 0435) BP: (113-122)/(82-87) 118/87 (11/22 0435) SpO2:  [98 %-100 %] 98 % (11/22 0435) Last BM Date: 04/26/21  Intake/Output from previous day: 11/21 0701 - 11/22 0700 In: 160 [P.O.:120; I.V.:40] Out: 1650 [Urine:1500; Stool:150] Intake/Output this shift: No intake/output data recorded.  PE: Gen:  Alert, NAD, pleasant Pulm: rate and effort normal Abd: Soft, NT/ND, vac to midline with good seal, ostomy pink with semisolid brown stool in pouch, right JP drain with serosanguinous fluid (0cc output last 24 hours)  Lab Results:  Recent Labs    04/27/21 0357 04/28/21 0449  WBC 13.2* 11.1*  HGB 9.7* 9.6*  HCT 30.0* 30.5*  PLT 716* 751*   BMET Recent Labs    04/27/21 0357 04/28/21 0449  NA 138 140  K 3.4* 3.5  CL 98 101  CO2 32 30  GLUCOSE 89 93  BUN 15 14  CREATININE 0.55 0.50  CALCIUM 8.3* 8.6*   PT/INR No results for input(s): LABPROT, INR in the last 72 hours. CMP     Component Value Date/Time   NA 140 04/28/2021 0449   NA 139 12/21/2019 1024   K 3.5 04/28/2021 0449   CL 101 04/28/2021 0449   CO2 30 04/28/2021 0449   GLUCOSE 93 04/28/2021 0449   BUN 14 04/28/2021 0449   BUN 13 12/21/2019 1024   CREATININE 0.50 04/28/2021 0449   CALCIUM 8.6 (L) 04/28/2021 0449   PROT 6.6 04/28/2021 0449   PROT 7.4 07/11/2019 1638   ALBUMIN 2.4 (L) 04/28/2021 0449   ALBUMIN 4.6 07/11/2019 1638   AST 12 (L) 04/28/2021 0449   ALT 13 04/28/2021 0449    ALKPHOS 75 04/28/2021 0449   BILITOT 0.4 04/28/2021 0449   BILITOT 0.2 07/11/2019 1638   GFRNONAA >60 04/28/2021 0449   GFRAA 96 12/21/2019 1024   Lipase     Component Value Date/Time   LIPASE 21 11/04/2020 1119       Studies/Results: No results found.  Anti-infectives: Anti-infectives (From admission, onward)    Start     Dose/Rate Route Frequency Ordered Stop   04/17/21 1700  ceFEPIme (MAXIPIME) 2 g in sodium chloride 0.9 % 100 mL IVPB  Status:  Discontinued        2 g 200 mL/hr over 30 Minutes Intravenous Every 8 hours 04/17/21 1530 04/21/21 0808   04/17/21 1700  metroNIDAZOLE (FLAGYL) IVPB 500 mg  Status:  Discontinued        500 mg 100 mL/hr over 60 Minutes Intravenous Every 12 hours 04/17/21 1530 04/21/21 0808   04/15/21 1000  anidulafungin (ERAXIS) 100 mg in sodium chloride 0.9 % 100 mL IVPB  Status:  Discontinued        100 mg 78 mL/hr over 100 Minutes Intravenous Every 24 hours 04/14/21 0744 04/21/21 0808   04/15/21 0900  clindamycin (CLEOCIN) IVPB 900 mg       See Hyperspace for full Linked Orders Report.   900 mg 100 mL/hr over 30 Minutes  Intravenous 60 min pre-op 04/14/21 1405 04/15/21 1142   04/15/21 0900  gentamicin (GARAMYCIN) 340 mg in dextrose 5 % 100 mL IVPB       See Hyperspace for full Linked Orders Report.   5 mg/kg  67.7 kg (Adjusted) 108.5 mL/hr over 60 Minutes Intravenous 60 min pre-op 04/14/21 1405 04/15/21 1834   04/14/21 1000  anidulafungin (ERAXIS) 200 mg in sodium chloride 0.9 % 200 mL IVPB        200 mg 78 mL/hr over 200 Minutes Intravenous  Once 04/14/21 0755 04/14/21 1500   04/13/21 0900  meropenem (MERREM) 1 g in sodium chloride 0.9 % 100 mL IVPB  Status:  Discontinued        1 g 200 mL/hr over 30 Minutes Intravenous Every 8 hours 04/13/21 0800 04/17/21 1530   04/10/21 2200  cefTRIAXone (ROCEPHIN) 2 g in sodium chloride 0.9 % 100 mL IVPB  Status:  Discontinued        2 g 200 mL/hr over 30 Minutes Intravenous Daily at bedtime 04/10/21  2044 04/13/21 0746   04/10/21 2200  metroNIDAZOLE (FLAGYL) IVPB 500 mg  Status:  Discontinued        500 mg 100 mL/hr over 60 Minutes Intravenous Every 12 hours 04/10/21 2044 04/13/21 0745        Assessment/Plan POD#13 - S/P LAPAROSCOPIC ASSISTED HARTMANN  (LEFT COLECTOMY / END COLOSTOMY), SMALL BOWEL RESECTION, LEFT RETROPERITONEAL / FLANK ABSCESS DRAINAGE 04/15/21 Dr. [name] - path: Colonic adenocarcinoma. Oncology has seen - wound vac MWF - d/c JP drain 04/28/21 - tolerating diet and colostomy functioning - WOC following, will need outpatient follow up with ostomy clinic - WBC continues to trend down - Stable for discharge to CIR when bed available   Anemia - hgb stable today at 9.6, last transfusion was on 11/19. Transfuse PRN per primary   ID - maxipime/ flagyl 11/11>>11/15, eraxis 11/8>>11/15, merrem 11/7>> 11/11, rocephin/flagyul 11/4>>11/7 FEN - carb mod diet VTE - SCDs/ per primary, ok for chemical dvt prophylaxis from surgical standpoint   HTN Morbid obesity BMI 39.99   LOS: 18 days    Wellington Hampshire, [facility] 04/28/2021, 8:39 AM Please see Amion for pager number during day hours 7:00am-4:30pm

## 2021-04-29 ENCOUNTER — Encounter (HOSPITAL_COMMUNITY): Payer: Self-pay | Admitting: Internal Medicine

## 2021-04-29 ENCOUNTER — Inpatient Hospital Stay (HOSPITAL_COMMUNITY): Payer: BC Managed Care – PPO

## 2021-04-29 ENCOUNTER — Other Ambulatory Visit: Payer: Self-pay

## 2021-04-29 LAB — COMPREHENSIVE METABOLIC PANEL
ALT: 11 U/L (ref 0–44)
AST: 13 U/L — ABNORMAL LOW (ref 15–41)
Albumin: 2.6 g/dL — ABNORMAL LOW (ref 3.5–5.0)
Alkaline Phosphatase: 72 U/L (ref 38–126)
Anion gap: 9 (ref 5–15)
BUN: 17 mg/dL (ref 6–20)
CO2: 27 mmol/L (ref 22–32)
Calcium: 8.7 mg/dL — ABNORMAL LOW (ref 8.9–10.3)
Chloride: 103 mmol/L (ref 98–111)
Creatinine, Ser: 0.56 mg/dL (ref 0.44–1.00)
GFR, Estimated: >60 mL/min (ref >60)
Glucose, Bld: 86 mg/dL (ref 70–99)
Potassium: 3.9 mmol/L (ref 3.5–5.1)
Sodium: 139 mmol/L (ref 135–145)
Total Bilirubin: 0.4 mg/dL (ref 0.3–1.2)
Total Protein: 7.1 g/dL (ref 6.5–8.1)

## 2021-04-29 LAB — MAGNESIUM: Magnesium: 2.4 mg/dL (ref 1.7–2.4)

## 2021-04-29 LAB — GLUCOSE, CAPILLARY
Glucose-Capillary: 75 mg/dL (ref 70–99)
Glucose-Capillary: 85 mg/dL (ref 70–99)
Glucose-Capillary: 87 mg/dL (ref 70–99)
Glucose-Capillary: 90 mg/dL (ref 70–99)
Glucose-Capillary: 99 mg/dL (ref 70–99)

## 2021-04-29 LAB — CBC WITH DIFFERENTIAL/PLATELET
Abs Immature Granulocytes: 0.13 10*3/uL — ABNORMAL HIGH (ref 0.00–0.07)
Basophils Absolute: 0 10*3/uL (ref 0.0–0.1)
Basophils Relative: 0 %
Eosinophils Absolute: 0.1 10*3/uL (ref 0.0–0.5)
Eosinophils Relative: 1 %
HCT: 31 % — ABNORMAL LOW (ref 36.0–46.0)
Hemoglobin: 9.8 g/dL — ABNORMAL LOW (ref 12.0–15.0)
Immature Granulocytes: 1 %
Lymphocytes Relative: 16 %
Lymphs Abs: 2.1 10*3/uL (ref 0.7–4.0)
MCH: 28.8 pg (ref 26.0–34.0)
MCHC: 31.6 g/dL (ref 30.0–36.0)
MCV: 91.2 fL (ref 80.0–100.0)
Monocytes Absolute: 0.7 10*3/uL (ref 0.1–1.0)
Monocytes Relative: 5 %
Neutro Abs: 9.5 10*3/uL — ABNORMAL HIGH (ref 1.7–7.7)
Neutrophils Relative %: 77 %
Platelets: 828 10*3/uL — ABNORMAL HIGH (ref 150–400)
RBC: 3.4 MIL/uL — ABNORMAL LOW (ref 3.87–5.11)
RDW: 15.9 % — ABNORMAL HIGH (ref 11.5–15.5)
WBC: 12.6 10*3/uL — ABNORMAL HIGH (ref 4.0–10.5)
nRBC: 0 % (ref 0.0–0.2)

## 2021-04-29 LAB — PHOSPHORUS: Phosphorus: 4.6 mg/dL (ref 2.5–4.6)

## 2021-04-29 MED ORDER — DIATRIZOATE MEGLUMINE & SODIUM 66-10 % PO SOLN
15.0000 mL | ORAL | Status: AC
  Administered 2021-04-29 (×2): 15 mL via ORAL
  Filled 2021-04-29: qty 30

## 2021-04-29 MED ORDER — SODIUM CHLORIDE 0.9 % IV SOLN
1.0000 g | Freq: Three times a day (TID) | INTRAVENOUS | Status: DC
  Administered 2021-04-29 – 2021-05-01 (×6): 1 g via INTRAVENOUS
  Filled 2021-04-29 (×8): qty 1

## 2021-04-29 MED ORDER — DIATRIZOATE MEGLUMINE & SODIUM 66-10 % PO SOLN
ORAL | Status: AC
  Administered 2021-04-29: 15 mL via ORAL
  Filled 2021-04-29: qty 30

## 2021-04-29 MED ORDER — IOHEXOL 350 MG/ML SOLN
80.0000 mL | Freq: Once | INTRAVENOUS | Status: AC | PRN
  Administered 2021-04-29: 80 mL via INTRAVENOUS

## 2021-04-29 MED ORDER — SODIUM CHLORIDE (PF) 0.9 % IJ SOLN
INTRAMUSCULAR | Status: AC
  Filled 2021-04-29: qty 50

## 2021-04-29 NOTE — Progress Notes (Signed)
   04/29/21 2200  Assess: MEWS Score  ECG Heart Rate (!) 113  Resp 20  Assess: MEWS Score  MEWS Temp 0  MEWS Systolic 0  MEWS Pulse 2  MEWS RR 0  MEWS LOC 0  MEWS Score 2  MEWS Score Color Yellow  Assess: if the MEWS score is Yellow or Red  Were vital signs taken at a resting state? Yes  Focused Assessment No change from prior assessment  Does the patient meet 2 or more of the SIRS criteria? Yes  Does the patient have a confirmed or suspected source of infection? No  MEWS guidelines implemented *See Row Information* No, previously yellow, continue vital signs every 4 hours  Treat  Pain Scale 0-10  Pain Score 0  Take Vital Signs  Increase Vital Sign Frequency  Yellow: Q 2hr X 2 then Q 4hr X 2, if remains yellow, continue Q 4hrs  Escalate  MEWS: Escalate Yellow: discuss with charge nurse/RN and consider discussing with provider and RRT  Notify: Charge Nurse/RN  Name of Charge Nurse/RN Notified Lauren RN  Date Charge Nurse/RN Notified 04/29/21  Assess: SIRS CRITERIA  SIRS Temperature  0  SIRS Pulse 1  SIRS Respirations  0  SIRS WBC 0  SIRS Score Sum  1

## 2021-04-29 NOTE — Progress Notes (Signed)
PROGRESS NOTE    Mikayla Rios  NTI:[id number] DOB: 07/01/1985 DOA: 04/10/2021 PCP: [name], MD    Chief Complaint  Patient presents with   Weakness   Tachycardia    Brief Narrative:  H/o HTN, obesity , osa ,diverticulosis with complicated diverticulitis and abscess formation 11/2020 status post IR guided placement of 2 left lower quadrant drains who presents to [facility] emergency department with complaints of generalized weakness and lightheadedness. 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  Subjective:  Reports feeling better, denies pain, no fever, but wbc  is trending up  Assessment & Plan:   Principal Problem:   Diverticulitis of large intestine with abscess Active Problems:   Class 3 severe obesity due to excess calories with serious comorbidity and body mass index (BMI) of 60.0 to 69.9 in adult [facility])   Obstructive sleep apnea   Anemia   Inappropriate sinus tachycardia   Essential hypertension   SIRS (systemic inflammatory response syndrome) (HCC)   Hypokalemia, inadequate intake   Protein-calorie malnutrition, severe (HCC)   Reactive thrombocytosis   Prolonged QT interval   Malignant neoplasm of sigmoid colon ([place])  CHRONIC PERFORATED COLON WITH RETROPERITONEAL ABSCESS PARTIAL SMALL BOWEL OBSTRUCTION PARTIAL COLON OBSTRUCTION H/o Complicated diverticulitis with abscess formation and drain placement in June 2022 S/p LAPAROSCOPIC ASSISTED HARTMANN  (LEFT COLECTOMY / END COLOSTOMY)  -wound vac -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 Wbc start to trend up, repeat ct scan today, management per general surgery, appreciate input, she is continued on meropenem  Septic shock  Secondary to retroperitoneal abscess -Patient required vasopressor therapy postoperatively, but has been able to wean off pressors  Acute respiratory failure with hypoxia requiring mechanical ventilation -Patient was intubated for operative management and due to hemodynamic instability  postoperatively, decision was made to leave the patient intubated and monitored in the ICU -She was able to extubate on 11/10 and respiratory status has been stable  Blood loss anemia Required prbc transfusion x10units, on lasix for volume overload  Tachycardia/palpitations:  seen by cardiology s/p Zio patch x3 days on 11/12/2019 showed no significant arrhythmias Tsh unremarkable She is started on betablocker    Nutritional Assessment:  The patient's BMI is: Body mass index is 39.99 kg/m.Marland Kitchen  Seen by dietician.  I agree with the assessment and plan as outlined below:  Nutrition Status: Nutrition Problem: Inadequate oral intake Etiology: acute illness Signs/Symptoms: per patient/family report Interventions: Ensure Enlive (each supplement provides 350kcal and 20 grams of protein)  .     Unresulted Labs (From admission, onward)     Start     Ordered   04/29/21 0500  CBC  Tomorrow morning,   R       Question:  Specimen collection method  Answer:  IV Team=IV Team collect   04/28/21 0836   04/23/21 0500  CBC with Differential/Platelet  Tomorrow morning,   R       Question:  Specimen collection method  Answer:  Unit=Unit collect   04/22/21 1848   04/11/21 1617  Vitamin B12  (Anemia Panel (PNL))  Once,   R       Question:  Specimen collection method  Answer:  IV Team=IV Team collect   04/11/21 1616   04/11/21 1617  Iron and TIBC  (Anemia Panel (PNL))  Once,   R       Question:  Specimen collection method  Answer:  IV Team=IV Team collect   04/11/21 1616   04/11/21 1617  Ferritin  (Anemia Panel (PNL))  Once,   R       Question:  Specimen collection method  Answer:  IV Team=IV Team collect   04/11/21 1616   Signed and Held  Comprehensive metabolic panel  Tomorrow morning,   R       Question:  Specimen collection method  Answer:  IV Team=IV Team collect   Signed and Held   Signed and Held  CBC WITH DIFFERENTIAL  Tomorrow morning,   R       Question:  Specimen collection method   Answer:  IV Team=IV Team collect   Signed and Held              DVT prophylaxis: Place and maintain sequential compression device Start: 04/18/21 1053 SCDs Start: 04/10/21 2059   Code Status:full Family Communication: patient Disposition:   Status is: Inpatient  Dispo: The patient is from: home              Anticipated d/c is to: CIR              Anticipated d/c date is: needs general surgery clearance                 Consultants:  General surgery Oncology cir  Procedures:  As above   Antimicrobials:    Anti-infectives (From admission, onward)    Start     Dose/Rate Route Frequency Ordered Stop   04/29/21 1545  meropenem (MERREM) 1 g in sodium chloride 0.9 % 100 mL IVPB        1 g 200 mL/hr over 30 Minutes Intravenous Every 8 hours 04/29/21 1453     04/17/21 1700  ceFEPIme (MAXIPIME) 2 g in sodium chloride 0.9 % 100 mL IVPB  Status:  Discontinued        2 g 200 mL/hr over 30 Minutes Intravenous Every 8 hours 04/17/21 1530 04/21/21 0808   04/17/21 1700  metroNIDAZOLE (FLAGYL) IVPB 500 mg  Status:  Discontinued        500 mg 100 mL/hr over 60 Minutes Intravenous Every 12 hours 04/17/21 1530 04/21/21 0808   04/15/21 1000  anidulafungin (ERAXIS) 100 mg in sodium chloride 0.9 % 100 mL IVPB  Status:  Discontinued        100 mg 78 mL/hr over 100 Minutes Intravenous Every 24 hours 04/14/21 0744 04/21/21 0808   04/15/21 0900  clindamycin (CLEOCIN) IVPB 900 mg       See Hyperspace for full Linked Orders Report.   900 mg 100 mL/hr over 30 Minutes Intravenous 60 min pre-op 04/14/21 1405 04/15/21 1142   04/15/21 0900  gentamicin (GARAMYCIN) 340 mg in dextrose 5 % 100 mL IVPB       See Hyperspace for full Linked Orders Report.   5 mg/kg  67.7 kg (Adjusted) 108.5 mL/hr over 60 Minutes Intravenous 60 min pre-op 04/14/21 1405 04/15/21 1834   04/14/21 1000  anidulafungin (ERAXIS) 200 mg in sodium chloride 0.9 % 200 mL IVPB        200 mg 78 mL/hr over 200 Minutes  Intravenous  Once 04/14/21 0755 04/14/21 1500   04/13/21 0900  meropenem (MERREM) 1 g in sodium chloride 0.9 % 100 mL IVPB  Status:  Discontinued        1 g 200 mL/hr over 30 Minutes Intravenous Every 8 hours 04/13/21 0800 04/17/21 1530   04/10/21 2200  cefTRIAXone (ROCEPHIN) 2 g in sodium chloride 0.9 % 100 mL IVPB  Status:  Discontinued  2 g 200 mL/hr over 30 Minutes Intravenous Daily at bedtime 04/10/21 2044 04/13/21 0746   04/10/21 2200  metroNIDAZOLE (FLAGYL) IVPB 500 mg  Status:  Discontinued        500 mg 100 mL/hr over 60 Minutes Intravenous Every 12 hours 04/10/21 2044 04/13/21 0745          Objective: Vitals:   04/29/21 0559 04/29/21 1607 04/29/21 1608 04/29/21 1643  BP: (!) 111/56 (!) 140/104 (!) 140/104   Pulse: (!) 109 (!) 116 (!) 115   Resp: 18 15 20 20   Temp: 98.5 F (36.9 C) 99.5 F (37.5 C) 99.5 F (37.5 C)   TempSrc: Oral Oral Oral   SpO2: 99% 100% 99%   Weight:      Height:        Intake/Output Summary (Last 24 hours) at 04/29/2021 1911 Last data filed at 04/29/2021 1815 Gross per 24 hour  Intake 580.41 ml  Output 275 ml  Net 305.41 ml   Filed Weights   04/15/21 1800 04/23/21 1752 04/24/21 0700  Weight: 99.5 kg 100.5 kg 102.4 kg    Examination:  General exam: alert, awake, communicative,calm, NAD Respiratory system: Clear to auscultation. Respiratory effort normal. Cardiovascular system:  slightly sinus tachycardia  Gastrointestinal system: Abdomen  + colostomy, + vac , nontender.  Normal bowel sounds heard. Central nervous system: Alert and oriented. No focal neurological deficits. Extremities:  no edema Skin: No rashes, lesions or ulcers Psychiatry: Judgement and insight appear normal. Mood & affect appropriate.     Data Reviewed: I have personally reviewed following labs and imaging studies  CBC: Recent Labs  Lab 04/25/21 0530 04/25/21 2258 04/26/21 0515 04/27/21 0357 04/28/21 0449 04/29/21 0230  WBC 14.2*  --  14.1*  13.2* 11.1* 12.6*  NEUTROABS 10.9*  --  10.8* 9.9* 7.9* 9.5*  HGB 6.5* 9.7* 9.9* 9.7* 9.6* 9.8*  HCT 20.8* 29.8* 30.7* 30.0* 30.5* 31.0*  MCV 93.7  --  86.7 88.5 89.7 91.2  PLT 709*  --  652* 716* 751* 828*    Basic Metabolic Panel: Recent Labs  Lab 04/25/21 0530 04/26/21 0515 04/27/21 0357 04/28/21 0449 04/29/21 0230  NA 136 134* 138 140 139  K 4.7 4.0 3.4* 3.5 3.9  CL 99 95* 98 101 103  CO2 31 32 32 30 27  GLUCOSE 106* 93 89 93 86  BUN 26* 18 15 14 17   CREATININE 0.35* 0.41* 0.55 0.50 0.56  CALCIUM 8.5* 8.3* 8.3* 8.6* 8.7*  MG 2.1 1.8 2.0 1.8 2.4  PHOS 5.3* 5.5* 4.9* 4.2 4.6    GFR: Estimated Creatinine Clearance: 110 mL/min (by C-G formula based on SCr of 0.56 mg/dL).  Liver Function Tests: Recent Labs  Lab 04/25/21 0530 04/26/21 0515 04/27/21 0357 04/28/21 0449 04/29/21 0230  AST 12* 15 15 12* 13*  ALT 10 12 13 13 11   ALKPHOS 108 87 84 75 72  BILITOT 0.3 0.7 0.6 0.4 0.4  PROT 5.8* 6.4* 6.6 6.6 7.1  ALBUMIN 2.3* 2.5* 2.5* 2.4* 2.6*    CBG: Recent Labs  Lab 04/29/21 0018 04/29/21 0556 04/29/21 0732 04/29/21 1204 04/29/21 1715  GLUCAP 99 90 87 85 75     No results found for this or any previous visit (from the past 240 hour(s)).       Radiology Studies: CT ABDOMEN PELVIS W CONTRAST  Addendum Date: 04/29/2021   ADDENDUM REPORT: 04/29/2021 16:59 ADDENDUM: After discussing the case with Dr. Dwaine Gale in interventional radiology, it was noted that the fluid  in the pelvis originally thought to be in the cul-de-sac is likely within the vagina, best seen on sagittal imaging. Recommend speculum exam. Also, in the left lateral wall in the area of prior abscess in the lateral wall abdominal musculature, some of the abdomen soft tissue appears to be enhancing. While this could be infectious, cannot exclude tumor seeding in the left lateral abdominal wall, measuring 6.9 x 3.8 cm on image 64 of series 2. Electronically Signed   By: Rolm Baptise M.D.   On: 04/29/2021  16:59   Result Date: 04/29/2021 CLINICAL DATA:  Abdominal abscess/infection suspected. History of diverticulitis repair with colostomy and colon cancer EXAM: CT ABDOMEN AND PELVIS WITH CONTRAST TECHNIQUE: Multidetector CT imaging of the abdomen and pelvis was performed using the standard protocol following bolus administration of intravenous contrast. CONTRAST:  62mL OMNIPAQUE IOHEXOL 350 MG/ML SOLN COMPARISON:  04/10/2021 FINDINGS: Lower chest: Small left pleural effusion. Compressive atelectasis in the left lower lobe. Right base atelectasis. Scattered coronary artery calcifications. Hepatobiliary: Gallstones noted within the gallbladder. No focal hepatic abnormality. Pancreas: No focal abnormality or ductal dilatation. Spleen: No focal abnormality.  Normal size. Adrenals/Urinary Tract: Left adrenal mass again noted, measuring 3 cm, unchanged. Right adrenal gland and kidneys unremarkable. Urinary bladder decompressed, grossly unremarkable. Stomach/Bowel: Left lower quadrant ostomy with Hartmann's pouch. No evidence of bowel obstruction. Vascular/Lymphatic: No evidence of aneurysm or adenopathy. Reproductive: Central fibroid with rim calcifications measures up to 4.7 cm. No adnexal mass. Other: Interval removal of previously seen left lower quadrant pigtail drainage catheter. Overall, left lower quadrant fluid collection has improved, but continued fluid collection in the left retroperitoneum, left psoas muscle, and left lateral abdominal wall musculature compatible with residual abscesses. New fluid collection in the cul-de-sac and wrapping around the uterus concerning for abscess. Musculoskeletal: No acute bony abnormality. IMPRESSION: Continued left retroperitoneal abscesses inferior to the left kidney, involving the left psoas muscle and left abdominal wall musculature. Overall size is decreased since prior study. Interval removal of left lower quadrant abscess drainage catheter. New fluid collection in the  cul-de-sac and wrapping around the uterus concerning for abscess. Cholelithiasis. Small left pleural effusion.  Bibasilar atelectasis. Stable left adrenal adenoma. Electronically Signed: By: Rolm Baptise M.D. On: 04/29/2021 14:29        Scheduled Meds:  acetaminophen  1,000 mg Oral Q6H   chlorhexidine  15 mL Mouth/Throat BID   Chlorhexidine Gluconate Cloth  6 each Topical Daily   diclofenac Sodium  4 g Topical QID   feeding supplement  237 mL Oral BID BM   furosemide  40 mg Intravenous Daily   insulin aspart  0-9 Units Subcutaneous Q6H   lip balm  1 application Topical BID   metoprolol tartrate  25 mg Oral BID   pantoprazole  40 mg Oral Daily   sodium chloride (PF)       Continuous Infusions:  sodium chloride 10 mL/hr at 04/29/21 1815   meropenem (MERREM) IV Stopped (04/29/21 1640)   methocarbamol (ROBAXIN) IV       LOS: 19 days   Time spent: 10mins Greater than 50% of this time was spent in counseling, explanation of diagnosis, planning of further management, and coordination of care.   Voice Recognition Viviann Spare dictation system was used to create this note, attempts have been made to correct errors. Please contact the author with questions and/or clarifications.   Florencia Reasons, MD PhD FACP [facility]  Available via Epic secure chat 7am-7pm for nonurgent issues Please page for  urgent issues To page the attending provider between 7A-7P or the covering provider during after hours 7P-7A, please log into the web site [email or web address] and access using universal [place] password for that web site. If you do not have the password, please call the hospital operator.    04/29/2021, 7:11 PM

## 2021-04-29 NOTE — Plan of Care (Signed)
  Problem: Health Behavior/Discharge Planning: Goal: Ability to manage health-related needs will improve Outcome: Progressing   

## 2021-04-29 NOTE — Progress Notes (Signed)
The proposed treatment discussed in conference is for discussion purpose only and is not a binding recommendation.  The patients have not been physically examined, or presented with their treatment options.  Therefore, final treatment plans cannot be decided.  

## 2021-04-29 NOTE — Progress Notes (Signed)
Inpatient Rehab Admissions Coordinator:   I do not have a bed for this pt. Today. CT to be completed today. Will follow up for potential admit later this week.   Clemens Catholic, [employer], [place] Admissions Coordinator  [phone] ([place]) [phone] (office)

## 2021-04-29 NOTE — H&P (Incomplete)
Physical Medicine and Rehabilitation Admission H&P    Chief Complaint  Patient presents with   Weakness   Tachycardia  : HPI: Mikayla Rios is a 35 year old right-handed female with history of hypertension, obesity with BMI 39.99, inappropriate sinus tachycardia followed by Dr. Gardiner Rhyme.  Per chart review patient lives with spouse.  Two-level home one half bath on main level one-step to entry.  Independent prior to admission.  Patient recently hospitalized [facility] long hospital 5/31 until 6/6 for abdominal pain with initial work-up showing perforated diverticulitis.  She was initially admitted to the surgical service treated with intravenous antibiotics and bowel rest.  Patient had worsening leukocytosis repeat CT showed diverticular abscess amenable to drainage and IR consulted and underwent CT-guided drain placement 11/07/2020.  She was discharged to home ambulating extended distances.  Presented 04/10/2021 with progressive generalized weakness and bouts of lightheadedness.  She has had decrease in appetite and gradual weight loss.  She had been scheduled to see gastroenterology 10/6 with eventual plan to undergo endoscopic work-up to rule out any malignancy.  Upon evaluation in the emergency department underwent CT imaging of the chest abdomen pelvis.  CT angiogram of the chest showed no evidence of pulmonary emboli however CT imaging of abdomen and pelvis did reveal new enhancing fluid collection posterior to segment of inflamed colon measuring 8.5 x 3.5 x 10 cm invading the adjacent iliopsoas muscle as well extending through the left lateral abdominal wall.  Posterior drain was noted to be in middle of this abscess.  The anterior drain appeared to be completely dislodged into the subcutaneous tissue.  This did show dramatic change compared to patient's recent CT imaging of the abdomen pelvis 10/28.  Patient also found to have multiple SIRS criteria with concern for early sepsis and therefore was  given 1 L bolus of lactated Ringer.  She was hypokalemic with intravenous potassium chloride initiated.  General surgery as well as interventional radiology and gastroenterology consulted patient did undergo drainage tube exchange 04/13/2021.  She underwent laparoscopic-assisted [place] left colectomy/end colostomy mobilization of splenic flexure of colon and small bowel resection left retroperitoneal/flank abscess drainage for chronic perforated colon with retroperitoneal abscess partial small bowel obstruction and colon obstruction 04/15/2021 per Dr. Johney Maine and pathology obtained diagnosed pT4NO stage II sigmoid colon cancer.patient did require vasopressor therapy postoperatively and weaned off pressors.  Leukocytosis 12,600 follow-up CT of abdomen 04/29/2021 showed continued left retroperitoneal abscess inferior to the left kidney overall size decreased since prior study.  New fluid collection in the cul-de-sac and wrapping around the uterus concerning for abscess reviewed by gynecology and did not feel clinically significant amount of fluid that would need intervention and patient currently is maintained on empiric meropenem changed to Cipro and Flagyl 1125 2022 x 5 days..  Wound care ongoing with colostomy in place as well as right JP drain and wound VAC.  Required mechanical ventilation postoperatively for acute respiratory failure extubated 11/10.  Bouts of prolonged QTC from hypokalemia and hypomagnesemia with supplement added.  Repeat EKG showed improvement in the QTC.  Echocardiogram with ejection fraction of 60 to 65% no wall motion abnormalities.  Dr [name] of medical oncology consulted.  Plan is for adjuvant chemotherapy FOLFOX or Capox for 3 to 6 months once recovered from latest surgery and rehabilitation.  Hospital course acute blood loss anemia transfused 2 units packed red blood cells FFP 04/19/2021 and latest hemoglobin 10.8.  Anasarca related to hypoalbuminemia and aggressive volume repletion she  did receive diuresis.  She did receive TPN for nutritional support.  She is now tolerating a regular consistency diet.  Therapy evaluations completed due to patient's decreased functional mobility was admitted for a comprehensive rehab program.  Review of Systems  Constitutional:  Positive for malaise/fatigue and weight loss. Negative for fever.  HENT:  Negative for hearing loss.   Eyes:  Negative for blurred vision and double vision.  Respiratory:  Positive for shortness of breath. Negative for cough.   Cardiovascular:  Positive for palpitations and leg swelling. Negative for chest pain.  Gastrointestinal:  Positive for abdominal pain and nausea.  Genitourinary:  Negative for dysuria, flank pain and hematuria.  Musculoskeletal:  Positive for joint pain and myalgias.  Skin:  Negative for rash.  Neurological:  Positive for dizziness.  All other systems reviewed and are negative. Past Medical History:  Diagnosis Date   Allergy    seasonal   Anemia    Blood infection 1985   Blood transfusion without reported diagnosis    Diverticulitis    Hypertension    Obesity    Sleep apnea    Past Surgical History:  Procedure Laterality Date   COLECTOMY WITH COLOSTOMY CREATION/HARTMANN PROCEDURE N/A 04/15/2021   Procedure: COLOSTOMY CREATION/HARTMANN PROCEDURE;  Surgeon: [name], MD;  Location: [facility];  Service: General;  Laterality: N/A;   IR CATHETER TUBE CHANGE  12/12/2020   IR CATHETER TUBE CHANGE  01/27/2021   IR CATHETER TUBE CHANGE  02/19/2021   IR CATHETER TUBE CHANGE  04/13/2021   IR RADIOLOGIST EVAL & MGMT  11/27/2020   IR RADIOLOGIST EVAL & MGMT  12/11/2020   IR RADIOLOGIST EVAL & MGMT  01/07/2021   IR RADIOLOGIST EVAL & MGMT  02/04/2021   IR SINUS/FIST TUBE CHK-NON GI  12/12/2020   IR SINUS/FIST TUBE CHK-NON GI  02/19/2021   LAPAROSCOPIC PARTIAL COLECTOMY N/A 04/15/2021   Procedure: LAPAROSCOPIC ASSISTED HARTMANN RESECTION;  Surgeon: [name], MD;  Location: [facility];   Service: General;  Laterality: N/A;   Family History  Problem Relation Age of Onset   Cancer Mother        breast, and in remission   Diabetes Mother    Hypertension Mother    Colon cancer Mother        dx at age 60   Congestive Heart Failure Father    Hypertension Father    Heart disease Paternal Aunt    Cancer Paternal Uncle        colon   Diabetes Maternal Grandmother    Hypertension Maternal Grandmother    Diabetes Maternal Grandfather    Kidney disease Maternal Grandfather    Diabetes Paternal Grandmother    Cancer Paternal Grandfather        colon   Autism Son    Social History:  reports that she has never smoked. She has never used smokeless tobacco. She reports that she does not currently use alcohol. She reports that she does not use drugs. Allergies:  Allergies  Allergen Reactions   Chlorhexidine Gluconate Hives and Itching    CHG wipes cause itching and hives requiring benadryl   Shellfish Allergy Hives   Penicillins Hives and Rash    Has patient had a PCN reaction causing immediate rash, facial/tongue/throat swelling, SOB or lightheadedness with hypotension: No Has patient had a PCN reaction causing severe rash involving mucus membranes or skin necrosis: hives Has patient had a PCN reaction that required hospitalization No Has patient had a PCN reaction occurring within the last 10  years: yes If all of the above answers are "NO", then may proceed with Cephalosporin use.    Medications Prior to Admission  Medication Sig Dispense Refill   acetaminophen (TYLENOL) 500 MG tablet Take 1,000 mg by mouth every 6 (six) hours as needed for moderate pain or headache.     amLODipine (NORVASC) 10 MG tablet TAKE 1 TABLET BY MOUTH EVERY DAY (Patient taking differently: Take 10 mg by mouth every evening.) 90 tablet 3   dicyclomine (BENTYL) 10 MG capsule Take 1 capsule (10 mg total) by mouth 3 (three) times daily as needed for spasms. 60 capsule 2    lisinopril-hydrochlorothiazide (ZESTORETIC) 20-12.5 MG tablet TAKE 2 TABLETS BY MOUTH EVERY DAY (Patient taking differently: Take 2 tablets by mouth daily.) 180 tablet 3   Prenatal Vit-Fe Fumarate-FA (PRENATAL MULTIVITAMIN) TABS tablet Take 1 tablet by mouth daily at 12 noon.     Simethicone (GAS-X PO) Take 1 tablet by mouth daily as needed (gas).     traMADol (ULTRAM) 50 MG tablet Take 50 mg by mouth every 6 (six) hours as needed for moderate pain.     benzonatate (TESSALON PERLES) 100 MG capsule Take 1 capsule (100 mg total) by mouth every 6 (six) hours as needed. (Patient not taking: Reported on 04/10/2021) 30 capsule 1    Drug Regimen Review Drug regimen was reviewed and remains appropriate with no significant issues identified  Home: Home Living Family/patient expects to be discharged to:: Private residence Living Arrangements: Spouse/significant other, Children Available Help at Discharge: Family, Available PRN/intermittently Type of Home:  (townhome) Home Access: Stairs to enter Technical brewer of Steps: 1 Home Layout: Two level, 1/2 bath on main level Alternate Level Stairs-Number of Steps: 14 Bathroom Shower/Tub: Optometrist: No Home Equipment: None  Lives With: Spouse   Functional History: Prior Function Prior Level of Function : Needs assist Physical Assist : Mobility (physical), ADLs (physical) Mobility (physical): Transfers, Stairs (reports husband has been helping her "up") Mobility Comments: Pt reports has had 2 drains since May 2022, spouse assists with transfers and stairs when he is home, did not use device, able to perform limited transfers while spouse at work until ~3 days ago. ADLs Comments: Pt reports has been performing sponge baths, spouse assists as needed, children perform household chores  Functional Status:  Mobility: Bed Mobility Overal bed mobility: Needs Assistance Bed Mobility: Supine to  Sit, Sit to Supine Rolling: Max assist Sidelying to sit: Mod assist, HOB elevated Supine to sit: Mod assist Sit to supine: Max assist General bed mobility comments: increased time, use of rail.  difficulty due to recent ABD surgery. Transfers Overall transfer level: Needs assistance Equipment used: Rolling walker (2 wheels) Transfers: Sit to/from Stand Sit to Stand: Min assist Bed to/from chair/wheelchair/BSC transfer type:: Stand pivot Stand pivot transfers: Min guard, +2 safety/equipment General transfer comment: Pt stood from elevated bed with min A of 1 with increased time, cues for momentum and hand placement. Ambulation/Gait Ambulation/Gait assistance: Supervision, Min guard Gait Distance (Feet): 22 Feet Assistive device: Rolling walker (2 wheels) Gait Pattern/deviations: Step-to pattern, Decreased stride length General Gait Details: pt made several laps around room with increased time for a total of 22 feet.  HR increased to 168 pt was asymptomatic Gait velocity: decreased    ADL: ADL Overall ADL's : Needs assistance/impaired Eating/Feeding: Set up, Sitting Grooming: Minimal assistance, Bed level Grooming Details (indicate cue type and reason): patient needed min A to wash face sitting in  bed with increased edema in BUE limiting ROM and reported "heaviness" impacting participation in tasks. Upper Body Bathing: Set up, Sitting Lower Body Bathing: Maximal assistance, Sitting/lateral leans Upper Body Dressing : Set up, Sitting Lower Body Dressing: Maximal assistance, Sitting/lateral leans Lower Body Dressing Details (indicate cue type and reason): patient was educated on LB dressing with AE sitting on edge of bed. patient was able to don sock with min A to increase independence in ADLs. patient was educated on total hip kit and toileting wands/tongs. patient reported she would look into them. Toilet Transfer: Min guard, BSC, Squat-pivot, Rolling walker (2 wheels) Toileting-  Clothing Manipulation and Hygiene: Total assistance, Sit to/from stand Toileting - Clothing Manipulation Details (indicate cue type and reason): patient reliant on UEs on walker - needs assistance for hygiene and clothing management. Functional mobility during ADLs: Min guard, Rolling walker (2 wheels) General ADL Comments: patient sat at edge of bed for > than 5 minutes. Therapist assisted patient with washing her back and applying lotion.  Cognition: Cognition Overall Cognitive Status: Within Functional Limits for tasks assessed Orientation Level: Oriented X4 Cognition Arousal/Alertness: Awake/alert Behavior During Therapy: WFL for tasks assessed/performed Overall Cognitive Status: Within Functional Limits for tasks assessed General Comments: AxO x 3 very pleasant/motivated  Physical Exam: Blood pressure (!) 134/97, pulse (!) 107, temperature 98.2 F (36.8 C), temperature source Oral, resp. rate 20, height $RemoveBe'5\' 3"'[id number]$  (1.6 m), weight 102.4 kg, last menstrual period 04/10/2021, SpO2 100 %. Physical Exam Abdominal:     Comments: Colostomy in place and functioning.  Right JP drain in place.  Wound VAC in place.  Neurological:     Comments: Patient is alert.  No acute distress.  Oriented x3 and follows commands.    Results for orders placed or performed during the hospital encounter of 04/10/21 (from the past 48 hour(s))  Glucose, capillary     Status: None   Collection Time: 04/29/21  5:56 AM  Result Value Ref Range   Glucose-Capillary 90 70 - 99 mg/dL    Comment: Glucose reference range applies only to samples taken after fasting for at least 8 hours.  Glucose, capillary     Status: None   Collection Time: 04/29/21  7:32 AM  Result Value Ref Range   Glucose-Capillary 87 70 - 99 mg/dL    Comment: Glucose reference range applies only to samples taken after fasting for at least 8 hours.  Glucose, capillary     Status: None   Collection Time: 04/29/21 12:04 PM  Result Value Ref Range    Glucose-Capillary 85 70 - 99 mg/dL    Comment: Glucose reference range applies only to samples taken after fasting for at least 8 hours.  Glucose, capillary     Status: None   Collection Time: 04/29/21  5:15 PM  Result Value Ref Range   Glucose-Capillary 75 70 - 99 mg/dL    Comment: Glucose reference range applies only to samples taken after fasting for at least 8 hours.  Glucose, capillary     Status: Abnormal   Collection Time: 04/30/21 12:02 AM  Result Value Ref Range   Glucose-Capillary 145 (H) 70 - 99 mg/dL    Comment: Glucose reference range applies only to samples taken after fasting for at least 8 hours.  Glucose, capillary     Status: None   Collection Time: 04/30/21  5:34 AM  Result Value Ref Range   Glucose-Capillary 91 70 - 99 mg/dL    Comment: Glucose reference range applies only  to samples taken after fasting for at least 8 hours.  Glucose, capillary     Status: Abnormal   Collection Time: 04/30/21  6:13 AM  Result Value Ref Range   Glucose-Capillary 105 (H) 70 - 99 mg/dL    Comment: Glucose reference range applies only to samples taken after fasting for at least 8 hours.  Glucose, capillary     Status: None   Collection Time: 04/30/21  7:37 AM  Result Value Ref Range   Glucose-Capillary 97 70 - 99 mg/dL    Comment: Glucose reference range applies only to samples taken after fasting for at least 8 hours.  Glucose, capillary     Status: None   Collection Time: 04/30/21 11:46 AM  Result Value Ref Range   Glucose-Capillary 95 70 - 99 mg/dL    Comment: Glucose reference range applies only to samples taken after fasting for at least 8 hours.  Glucose, capillary     Status: None   Collection Time: 04/30/21  4:47 PM  Result Value Ref Range   Glucose-Capillary 74 70 - 99 mg/dL    Comment: Glucose reference range applies only to samples taken after fasting for at least 8 hours.  Glucose, capillary     Status: None   Collection Time: 05/01/21 12:08 AM  Result Value Ref  Range   Glucose-Capillary 89 70 - 99 mg/dL    Comment: Glucose reference range applies only to samples taken after fasting for at least 8 hours.   CT ABDOMEN PELVIS W CONTRAST  Addendum Date: 04/29/2021   ADDENDUM REPORT: 04/29/2021 16:59 ADDENDUM: After discussing the case with Dr. Dwaine Gale in interventional radiology, it was noted that the fluid in the pelvis originally thought to be in the cul-de-sac is likely within the vagina, best seen on sagittal imaging. Recommend speculum exam. Also, in the left lateral wall in the area of prior abscess in the lateral wall abdominal musculature, some of the abdomen soft tissue appears to be enhancing. While this could be infectious, cannot exclude tumor seeding in the left lateral abdominal wall, measuring 6.9 x 3.8 cm on image 64 of series 2. Electronically Signed   By: Rolm Baptise M.D.   On: 04/29/2021 16:59   Result Date: 04/29/2021 CLINICAL DATA:  Abdominal abscess/infection suspected. History of diverticulitis repair with colostomy and colon cancer EXAM: CT ABDOMEN AND PELVIS WITH CONTRAST TECHNIQUE: Multidetector CT imaging of the abdomen and pelvis was performed using the standard protocol following bolus administration of intravenous contrast. CONTRAST:  31m OMNIPAQUE IOHEXOL 350 MG/ML SOLN COMPARISON:  04/10/2021 FINDINGS: Lower chest: Small left pleural effusion. Compressive atelectasis in the left lower lobe. Right base atelectasis. Scattered coronary artery calcifications. Hepatobiliary: Gallstones noted within the gallbladder. No focal hepatic abnormality. Pancreas: No focal abnormality or ductal dilatation. Spleen: No focal abnormality.  Normal size. Adrenals/Urinary Tract: Left adrenal mass again noted, measuring 3 cm, unchanged. Right adrenal gland and kidneys unremarkable. Urinary bladder decompressed, grossly unremarkable. Stomach/Bowel: Left lower quadrant ostomy with Hartmann's pouch. No evidence of bowel obstruction. Vascular/Lymphatic: No  evidence of aneurysm or adenopathy. Reproductive: Central fibroid with rim calcifications measures up to 4.7 cm. No adnexal mass. Other: Interval removal of previously seen left lower quadrant pigtail drainage catheter. Overall, left lower quadrant fluid collection has improved, but continued fluid collection in the left retroperitoneum, left psoas muscle, and left lateral abdominal wall musculature compatible with residual abscesses. New fluid collection in the cul-de-sac and wrapping around the uterus concerning for abscess. Musculoskeletal: No acute bony  abnormality. IMPRESSION: Continued left retroperitoneal abscesses inferior to the left kidney, involving the left psoas muscle and left abdominal wall musculature. Overall size is decreased since prior study. Interval removal of left lower quadrant abscess drainage catheter. New fluid collection in the cul-de-sac and wrapping around the uterus concerning for abscess. Cholelithiasis. Small left pleural effusion.  Bibasilar atelectasis. Stable left adrenal adenoma. Electronically Signed: By: Rolm Baptise M.D. On: 04/29/2021 14:29       Medical Problem List and Plan: 1.  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  Status post laparoscopic-assisted [place] left colectomy/end colostomy, small bowel resection, left retroperitoneal/flank abscess drainage 04/15/2021.  JP drain removed 04/28/2021.  Colostomy care as directed/wound [facility] Monday Wednesday Friday.  Plan outpatient follow-up medical oncology for systemic chemotherapy  -patient may *** shower  -ELOS/Goals: *** 2.  Antithrombotics: -DVT/anticoagulation:  Mechanical: Antiembolism stockings, thigh (TED hose) Bilateral lower extremities.  Check vascular study  -antiplatelet therapy: N/A 3. Pain Management: Voltaren gel 4 times daily, oxycodone/Robaxin as needed 4. Mood: Ativan as needed  -antipsychotic agents: N/A 5. Neuropsych: This patient is capable of making decisions on her  own behalf. 6. Skin/Wound Care: Routine skin checks 7. Fluids/Electrolytes/Nutrition: Routine in and outs with follow-up chemistries 8.  Acute blood loss anemia.  Last transfusion 11/19.  Follow-up CBC 9.  Septic shock.  Weaned from pressors 10.  Acute respiratory failure.  Extubated 11/10.  Monitor oxygen saturations. 11.  History of inappropriate sinus tachycardia.  Followed by cardiology service Dr. Gardiner Rhyme.  Continue Lopressor 25 mg twice daily 12.  Anasarca.  Related to hypoalbuminemia and aggressive volume repletion.  Continue Lasix. 13.  Obesity.  BMI 39.99.  Dietary follow-up 14.  Leukocytosis. Improved 12,100 from 14,100. Follow-up CT abdomen abdomen/pelvis 12/27/2020 showed continued left retroperitoneal abscess inferior to the left kidney involving the left psoas muscle left abdominal wall overall size decreased since prior study..  There was a new fluid collection in the cul-de-sac and wrapping around the uterus concerning for abscess reviewed by gynecology services no plan for intervention and currently maintained on empiric meropenem changed to Cipro and Flagyl 1125 2022 x 5 days.  Follow-up per general surgery Cathlyn Parsons, PA-C 05/01/2021

## 2021-04-29 NOTE — Progress Notes (Signed)
14 Days Post-Op   Subjective/Chief Complaint: Pt with no issues today Feels "awesome"    Objective: Vital signs in last 24 hours: Temp:  [98.2 F (36.8 C)-99.2 F (37.3 C)] 98.5 F (36.9 C) (11/23 0559) Pulse Rate:  [109-118] 109 (11/23 0559) Resp:  [16-20] 18 (11/23 0559) BP: (111-142)/(56-94) 111/56 (11/23 0559) SpO2:  [97 %-100 %] 99 % (11/23 0559) Last BM Date:  (today via colostomy)  Intake/Output from previous day: 11/22 0701 - 11/23 0700 In: 460 [P.O.:240; I.V.:170; IV Piggyback:50] Out: 1200 [Urine:1000; Stool:200] Intake/Output this shift: No intake/output data recorded.  General appearance: alert and cooperative GI: soft, non-tender; bowel sounds normal; no masses,  no organomegaly and ostomy patent, vac in place  Lab Results:  Recent Labs    04/28/21 0449 04/29/21 0230  WBC 11.1* 12.6*  HGB 9.6* 9.8*  HCT 30.5* 31.0*  PLT 751* 828*   BMET Recent Labs    04/28/21 0449 04/29/21 0230  NA 140 139  K 3.5 3.9  CL 101 103  CO2 30 27  GLUCOSE 93 86  BUN 14 17  CREATININE 0.50 0.56  CALCIUM 8.6* 8.7*   PT/INR No results for input(s): LABPROT, INR in the last 72 hours. ABG No results for input(s): PHART, HCO3 in the last 72 hours.  Invalid input(s): PCO2, PO2  Studies/Results: No results found.  Anti-infectives: Anti-infectives (From admission, onward)    Start     Dose/Rate Route Frequency Ordered Stop   04/17/21 1700  ceFEPIme (MAXIPIME) 2 g in sodium chloride 0.9 % 100 mL IVPB  Status:  Discontinued        2 g 200 mL/hr over 30 Minutes Intravenous Every 8 hours 04/17/21 1530 04/21/21 0808   04/17/21 1700  metroNIDAZOLE (FLAGYL) IVPB 500 mg  Status:  Discontinued        500 mg 100 mL/hr over 60 Minutes Intravenous Every 12 hours 04/17/21 1530 04/21/21 0808   04/15/21 1000  anidulafungin (ERAXIS) 100 mg in sodium chloride 0.9 % 100 mL IVPB  Status:  Discontinued        100 mg 78 mL/hr over 100 Minutes Intravenous Every 24 hours 04/14/21  0744 04/21/21 0808   04/15/21 0900  clindamycin (CLEOCIN) IVPB 900 mg       See Hyperspace for full Linked Orders Report.   900 mg 100 mL/hr over 30 Minutes Intravenous 60 min pre-op 04/14/21 1405 04/15/21 1142   04/15/21 0900  gentamicin (GARAMYCIN) 340 mg in dextrose 5 % 100 mL IVPB       See Hyperspace for full Linked Orders Report.   5 mg/kg  67.7 kg (Adjusted) 108.5 mL/hr over 60 Minutes Intravenous 60 min pre-op 04/14/21 1405 04/15/21 1834   04/14/21 1000  anidulafungin (ERAXIS) 200 mg in sodium chloride 0.9 % 200 mL IVPB        200 mg 78 mL/hr over 200 Minutes Intravenous  Once 04/14/21 0755 04/14/21 1500   04/13/21 0900  meropenem (MERREM) 1 g in sodium chloride 0.9 % 100 mL IVPB  Status:  Discontinued        1 g 200 mL/hr over 30 Minutes Intravenous Every 8 hours 04/13/21 0800 04/17/21 1530   04/10/21 2200  cefTRIAXone (ROCEPHIN) 2 g in sodium chloride 0.9 % 100 mL IVPB  Status:  Discontinued        2 g 200 mL/hr over 30 Minutes Intravenous Daily at bedtime 04/10/21 2044 04/13/21 0746   04/10/21 2200  metroNIDAZOLE (FLAGYL) IVPB 500 mg  Status:  Discontinued        500 mg 100 mL/hr over 60 Minutes Intravenous Every 12 hours 04/10/21 2044 04/13/21 0745       Assessment/Plan: POD#14 - S/P LAPAROSCOPIC ASSISTED HARTMANN  (LEFT COLECTOMY / END COLOSTOMY), SMALL BOWEL RESECTION, LEFT RETROPERITONEAL / FLANK ABSCESS DRAINAGE 04/15/21 Dr. [name] - path: Colonic adenocarcinoma. Oncology has seen - wound vac MWF - d/c JP drain 04/28/21 - tolerating diet and colostomy functioning - WOC following, will need outpatient follow up with ostomy clinic - WBC continues to trending up-->plan CT today to r/o abscess - Stable for discharge to CIR when bed available   Anemia - hgb stable today at 9.6, last transfusion was on 11/19. Transfuse PRN per primary   ID - maxipime/ flagyl 11/11>>11/15, eraxis 11/8>>11/15, merrem 11/7>> 11/11, rocephin/flagyul 11/4>>11/7 FEN - carb mod  diet VTE - SCDs/ per primary, Rios for chemical dvt prophylaxis from surgical standpoint   HTN Morbid obesity BMI 39.99   LOS: 19 days    Mikayla Rios 04/29/2021

## 2021-04-29 NOTE — Progress Notes (Signed)
Physical Therapy Treatment Patient Details Name: Mikayla Rios MRN: [id number] DOB: [birth year] Today's Date: 04/29/2021   History of Present Illness Mikayla Rios is a 35 yo female presents with complaints of generalized weakness and lightheadedness on 04/10/21. She was found to have any abscess  left lateral abdominal wall.  She underwent drain exchange and upsizing by IR on 11/7 and then subsequently underwent a colectomy and colostomy with small bowel resection and retroperitoneal abscess drainage on 04/15/2021.  Postoperatively she was monitored in the ICU the vent and briefly required pressors.  She is extubated 04/16/2021 and has since been weaned off of pressors. PMH: HTN, sinus tachy, obesity, obstructive sleep apnea and diverticulosis with complicated diverticulitis and abscess formation 11/2020 status post IR guided placement of 2 left lower quadrant drains.    PT Comments    POD # 14 Pt just got back from IR Imaging.  Assisted OOB with increased time to amb around room while PA came in to share results of imaging.  General Gait Details: pt made several laps around room with increased time for a total of 22 feet.  HR increased to 168 pt was asymptomatic.  Assisted back to bed.   Pt plans to D/C to Inpt rehab.    Recommendations for follow up therapy are one component of a multi-disciplinary discharge planning process, led by the attending physician.  Recommendations may be updated based on patient status, additional functional criteria and insurance authorization.  Follow Up Recommendations  Acute inpatient rehab (3hours/day)     Assistance Recommended at Discharge Frequent or constant Supervision/Assistance  Equipment Recommendations  Rolling walker (2 wheels)    Recommendations for Other Services       Precautions / Restrictions Precautions Precautions: Fall Precaution Comments: monitor HR, colostomy, R drain, VAC     Mobility  Bed Mobility Overal bed mobility: Needs  Assistance Bed Mobility: Supine to Sit;Sit to Supine     Supine to sit: Mod assist Sit to supine: Max assist   General bed mobility comments: increased time, use of rail.  difficulty due to recent ABD surgery.    Transfers Overall transfer level: Needs assistance Equipment used: Rolling walker (2 wheels) Transfers: Sit to/from Stand Sit to Stand: Min assist           General transfer comment: Pt stood from elevated bed with min A of 1 with increased time, cues for momentum and hand placement.    Ambulation/Gait Ambulation/Gait assistance: Supervision;Min guard Gait Distance (Feet): 22 Feet Assistive device: Rolling walker (2 wheels) Gait Pattern/deviations: Step-to pattern;Decreased stride length Gait velocity: decreased     General Gait Details: pt made several laps around room with increased time for a total of 22 feet.  HR increased to 168 pt was asymptomatic   [employer]ine [employer] (Stroke Patients Only)       Balance                                            Cognition Arousal/Alertness: Awake/alert Behavior During Therapy: WFL for tasks assessed/performed Overall Cognitive Status: Within Functional Limits for tasks assessed                                 General  Comments: AxO x 3 very pleasant/motivated        Exercises      General Comments        Pertinent Vitals/Pain Pain Assessment: 0-10 Pain Score: 6  Pain Location: abdomen, flanks with movement Pain Descriptors / Indicators: Grimacing;Guarding;[place]                          Prior Function            PT Goals (current goals can now be found in the care plan section) Progress towards PT goals: Progressing toward goals    Frequency    Min 3X/week      PT Plan Current plan remains appropriate    Co-evaluation              AM-PAC PT "6 Clicks" Mobility   Outcome  Measure  Help needed turning from your back to your side while in a flat bed without using bedrails?: A Lot Help needed moving from lying on your back to sitting on the side of a flat bed without using bedrails?: A Lot Help needed moving to and from a bed to a chair (including a wheelchair)?: A Lot Help needed standing up from a chair using your arms (e.g., wheelchair or bedside chair)?: A Lot Help needed to walk in hospital room?: A Lot Help needed climbing 3-5 steps with a railing? : A Lot 6 Click Score: 12    End of Session Equipment Utilized During Treatment: Gait belt Activity Tolerance: Patient tolerated treatment well Patient left: in bed;with call bell/phone within reach;with family/visitor present Nurse Communication: Mobility status PT Visit Diagnosis: Unsteadiness on feet (R26.81);Other abnormalities of gait and mobility (R26.89);Muscle weakness (generalized) (M62.81);Pain;Difficulty in walking, not elsewhere classified (R26.2)     Time: 5035-4656 PT Time Calculation (min) (ACUTE ONLY): 26 min  Charges:  $Gait Training: 8-22 mins $Therapeutic Activity: 8-22 mins                     {Daesha Insco  PTA Acute  Rehabilitation Services Pager      [phone] Office      [phone]

## 2021-04-29 NOTE — Consult Note (Signed)
Chief Complaint: Patient was seen in consultation today for  Chief Complaint  Patient presents with   Weakness   Tachycardia   at the request of Margie Billet, [place]  Referring Physician(s): Ralene Ok, MD  Supervising Physician: Mir, Sharen Heck  Patient Status: [facility] - In-pt  History of Present Illness: Mikayla Rios is a 35 y.o. female who has been treated for perforated diverticulitis.  She is 14 days s/p colectomy with colostomy creation/hartmann's procedure.  Path revealed Colonic adenocarcinoma.  Due to abscess formation she required LLQ drains, removed 04/28/21.  Despite feeling better, her WBC has elevated to 12.6.  A new fluid collection was seen on today's CT in the pelvis concerning for abscess.  IR was contacted regarding drainage.  Pt lying in bed with family at bedside.  She expresses understanding of the new development and the recommendation for drainage.  In discussion, she conveys feelings of overwhelm, that she has been through a lot, and tears up asking for time to discuss recommendation.  When checking back in patient states she wishes to proceed with drainage.   Past Medical History:  Diagnosis Date   Allergy    seasonal   Anemia    Blood infection 1985   Blood transfusion without reported diagnosis    Diverticulitis    Hypertension    Obesity    Sleep apnea     Past Surgical History:  Procedure Laterality Date   COLECTOMY WITH COLOSTOMY CREATION/HARTMANN PROCEDURE N/A 04/15/2021   Procedure: COLOSTOMY CREATION/HARTMANN PROCEDURE;  Surgeon: [name], MD;  Location: [facility]S;  Service: General;  Laterality: N/A;   IR CATHETER TUBE CHANGE  12/12/2020   IR CATHETER TUBE CHANGE  01/27/2021   IR CATHETER TUBE CHANGE  02/19/2021   IR CATHETER TUBE CHANGE  04/13/2021   IR RADIOLOGIST EVAL & MGMT  11/27/2020   IR RADIOLOGIST EVAL & MGMT  12/11/2020   IR RADIOLOGIST EVAL & MGMT  01/07/2021   IR RADIOLOGIST EVAL & MGMT  02/04/2021   IR SINUS/FIST TUBE  CHK-NON GI  12/12/2020   IR SINUS/FIST TUBE CHK-NON GI  02/19/2021   LAPAROSCOPIC PARTIAL COLECTOMY N/A 04/15/2021   Procedure: LAPAROSCOPIC ASSISTED HARTMANN RESECTION;  Surgeon: [name], MD;  Location: [facility];  Service: General;  Laterality: N/A;    Allergies: Shellfish allergy and Penicillins  Medications: Prior to Admission medications   Medication Sig Start Date End Date Taking? Authorizing Provider  acetaminophen (TYLENOL) 500 MG tablet Take 1,000 mg by mouth every 6 (six) hours as needed for moderate pain or headache.   Yes [provider]  amLODipine (NORVASC) 10 MG tablet TAKE 1 TABLET BY MOUTH EVERY DAY Patient taking differently: Take 10 mg by mouth every evening. 01/19/21  Yes [name], MD  dicyclomine (BENTYL) 10 MG capsule Take 1 capsule (10 mg total) by mouth 3 (three) times daily as needed for spasms. 03/12/21  Yes Sharyn Creamer, MD  lisinopril-hydrochlorothiazide (ZESTORETIC) 20-12.5 MG tablet TAKE 2 TABLETS BY MOUTH EVERY DAY Patient taking differently: Take 2 tablets by mouth daily. 12/10/20  Yes [name], MD  Prenatal Vit-Fe Fumarate-FA (PRENATAL MULTIVITAMIN) TABS tablet Take 1 tablet by mouth daily at 12 noon.   Yes [provider]  Simethicone (GAS-X PO) Take 1 tablet by mouth daily as needed (gas).   Yes [provider]  traMADol (ULTRAM) 50 MG tablet Take 50 mg by mouth every 6 (six) hours as needed for moderate pain. 12/29/20  Yes [provider]  benzonatate (TESSALON PERLES) 100 MG capsule Take 1 capsule (100 mg total) by mouth every 6 (six) hours as needed. Patient not taking: Reported on 04/10/2021 10/06/20 10/06/21  Minette Brine, FNP  metoprolol succinate (TOPROL-XL) 25 MG 24 hr tablet TAKE 1 TABLET BY MOUTH TWICE A DAY 04/27/21   Minette Brine, FNP     Family History  Problem Relation Age of Onset   Cancer Mother        breast, and in remission   Diabetes Mother    Hypertension Mother     Colon cancer Mother        dx at age 81   Congestive Heart Failure Father    Hypertension Father    Heart disease Paternal Aunt    Cancer Paternal Uncle        colon   Diabetes Maternal Grandmother    Hypertension Maternal Grandmother    Diabetes Maternal Grandfather    Kidney disease Maternal Grandfather    Diabetes Paternal Grandmother    Cancer Paternal Grandfather        colon   Autism Son     Social History   Socioeconomic History   Marital status: Married    Spouse name: Insurance underwriter   Number of children: 3   Years of education: Not on file   Highest education level: Not on file  Occupational History   Not on file  Tobacco Use   Smoking status: Never   Smokeless tobacco: Never  Vaping Use   Vaping Use: Never used  Substance and Sexual Activity   Alcohol use: Not Currently   Drug use: No   Sexual activity: Yes    Partners: Male    Birth control/protection: None  Other Topics Concern   Not on file  Social History Narrative   Not on file   Social Determinants of Health   Financial Resource Strain: Not on file  Food Insecurity: Not on file  Transportation Needs: Not on file  Physical Activity: Not on file  Stress: Not on file  Social Connections: Not on file    Review of Systems: A 12 point ROS discussed and pertinent positives are indicated in the HPI above.  All other systems are negative.  Review of Systems  Constitutional: Negative.   HENT: Negative.    Eyes: Negative.   Respiratory: Negative.    Cardiovascular: Negative.   Gastrointestinal: Negative.   Endocrine: Negative.   Genitourinary: Negative.   Musculoskeletal: Negative.   Skin: Negative.   Allergic/Immunologic: Negative.   Neurological: Negative.   Hematological: Negative.   Psychiatric/Behavioral:  The patient is nervous/anxious.    Vital Signs: BP (!) 111/56 (BP Location: Left Arm)   Pulse (!) 109   Temp 98.5 F (36.9 C) (Oral)   Resp 18   Ht 5\' 3"  (1.6 m)   Wt 225 lb 12 oz  (102.4 kg)   LMP 04/10/2021 (Approximate)   SpO2 99%   BMI 39.99 kg/m   Physical Exam Constitutional:      Appearance: She is not ill-appearing.  HENT:     Head: Normocephalic and atraumatic.     Mouth/Throat:     Mouth: Mucous membranes are moist.     Pharynx: Oropharynx is clear.  Eyes:     Extraocular Movements: Extraocular movements intact.  Cardiovascular:     Rate and Rhythm: Tachycardia present.     Pulses: Normal pulses.  Pulmonary:     Effort: Pulmonary effort is normal.  Abdominal:  Comments: Colostomy, wound vac, bandage where drain removed from LLQ  Musculoskeletal:     Cervical back: Neck supple.  Skin:    General: Skin is warm and dry.     Capillary Refill: Capillary refill takes less than 2 seconds.  Neurological:     General: No focal deficit present.     Mental Status: She is alert and oriented to person, place, and time.  Psychiatric:        Mood and Affect: Mood normal.        Behavior: Behavior normal.        Thought Content: Thought content normal.    Imaging: DG Abd 1 View  Result Date: 04/15/2021 CLINICAL DATA:  Incorrect needle count. EXAM: ABDOMEN - 1 VIEW COMPARISON:  None. FINDINGS: There is an extensive amount of overlying tubing and suction bulb which may obscure a small needle. Large amount of soft tissue gas is noted. This may also obscure needle. No definite evidence of surgical needle is noted. IMPRESSION: Exam is significantly limited due to overlying tubing and suction bulb as well as a large amount of soft tissue gas. No definite radiopaque needle is noted, although this may be obscured due to overlying artifact as described above. These results were called by telephone at the time of interpretation on 04/15/2021 at 4:10 pm to provider Dr. Johney Maine, who verbally acknowledged these results. Electronically Signed   By: Marijo Conception M.D.   On: 04/15/2021 16:11   CT Angio Chest PE W and/or Wo Contrast  Result Date: 04/10/2021 CLINICAL  DATA:  High probability PE. Being currently treated for abscess diverticulitis. Tachycardia. EXAM: CT ANGIOGRAPHY CHEST CT ABDOMEN AND PELVIS WITH CONTRAST TECHNIQUE: Multidetector CT imaging of the chest was performed using the standard protocol during bolus administration of intravenous contrast. Multiplanar CT image reconstructions and MIPs were obtained to evaluate the vascular anatomy. Multidetector CT imaging of the abdomen and pelvis was performed using the standard protocol during bolus administration of intravenous contrast. CONTRAST:  9mL OMNIPAQUE IOHEXOL 350 MG/ML SOLN COMPARISON:  CT abdomen and pelvis 04/01/2021. CT abdomen and pelvis 11/27/2020. FINDINGS: CTA CHEST FINDINGS Cardiovascular: Satisfactory opacification of the pulmonary arteries to the segmental level. No evidence of pulmonary embolism. Normal heart size. No pericardial effusion. Mediastinum/Nodes: No enlarged mediastinal, hilar, or axillary lymph nodes. Thyroid gland, trachea, and esophagus demonstrate no significant findings. Lungs/Pleura: Lungs are clear. No pleural effusion or pneumothorax. Musculoskeletal: No chest wall abnormality. No acute or significant osseous findings. Review of the MIP images confirms the above findings. CT ABDOMEN and PELVIS FINDINGS Hepatobiliary: There is diffuse fatty infiltration of the liver. Gallstones are present. There is no biliary ductal dilatation. Pancreas: Unremarkable. No pancreatic ductal dilatation or surrounding inflammatory changes. Spleen: Normal in size without focal abnormality. Adrenals/Urinary Tract: There is a 3.5 x 2.8 cm stable left adrenal nodule previously characterized as adenoma. Right adrenal gland is within normal limits. There is a cyst in the inferior pole the left kidney measuring 3 cm, unchanged. The kidneys otherwise appear within normal limits. The bladder is within normal limits. Stomach/Bowel: There is no evidence for bowel obstruction. There is marked focal wall  thickening of the mid descending colon with surrounding inflammatory stranding. This has slightly increased when compared to the prior study. There is a small amount of extraluminal gas compatible with perforation. Gas has increased when compared to the prior examination. Stomach is within normal limits. Vascular/Lymphatic: No significant vascular findings are present. Aorta and IVC are normal in size.  There are prominent and enlarged central mesenteric lymph nodes which are new from the prior examination. The largest lymph node measures 1.5 by 2.4 cm image 4/49. Reproductive: Again seen is a hypodense lesion in the fundus of the uterus containing some calcifications measuring 4.7 cm similar to the prior study. Adnexa are within normal limits. Other: Previously identified percutaneous drainage catheter in the anterior left abdomen has been pulled back in the interval. The distal catheter tip is in the deep subcutaneous soft tissues of the anterior abdominal wall. Previously identified left lateral percutaneous drainage catheter is unchanged in position. The distal portion of the catheter is in a new lobulated enhancing fluid collection containing air abutting the inflamed portion of the colon. This collection measures 8.5 x 3.5 x 10.0 cm and is now within the adjacent iliopsoas musculature and abutting the left iliacus musculature. This fluid collection also extends through the left lateral abdominal wall on image 4/51, a new finding. There is trace free fluid in the pelvis. There is no focal abdominal wall hernia. Musculoskeletal: No acute or significant osseous findings. Review of the MIP images confirms the above findings. IMPRESSION: 1. Again seen are findings compatible with descending colon diverticulitis with perforation. Free air and inflammation have increased in the interval. 2. New lobulated enhancing fluid collection posterior to this segment of inflamed colon measuring 8.5 x 3.5 x 10.0 cm. This  collection now invades the adjacent iliopsoas muscle as well as extends through the left lateral abdominal wall. The tip of the drainage catheter is in this collection. Findings are compatible with abscess. 3. Anterior left percutaneous drainage catheter tip has been pulled back and is now within the subcutaneous tissues. 4. Trace free fluid. 5. No pulmonary embolism.  No acute cardiopulmonary process. 6. Cholelithiasis. 7. Fatty infiltration of the liver. Electronically Signed   By: Ronney Asters M.D.   On: 04/10/2021 19:01   CT ABDOMEN PELVIS W CONTRAST  Result Date: 04/29/2021 CLINICAL DATA:  Abdominal abscess/infection suspected. History of diverticulitis repair with colostomy and colon cancer EXAM: CT ABDOMEN AND PELVIS WITH CONTRAST TECHNIQUE: Multidetector CT imaging of the abdomen and pelvis was performed using the standard protocol following bolus administration of intravenous contrast. CONTRAST:  32mL OMNIPAQUE IOHEXOL 350 MG/ML SOLN COMPARISON:  04/10/2021 FINDINGS: Lower chest: Small left pleural effusion. Compressive atelectasis in the left lower lobe. Right base atelectasis. Scattered coronary artery calcifications. Hepatobiliary: Gallstones noted within the gallbladder. No focal hepatic abnormality. Pancreas: No focal abnormality or ductal dilatation. Spleen: No focal abnormality.  Normal size. Adrenals/Urinary Tract: Left adrenal mass again noted, measuring 3 cm, unchanged. Right adrenal gland and kidneys unremarkable. Urinary bladder decompressed, grossly unremarkable. Stomach/Bowel: Left lower quadrant ostomy with Hartmann's pouch. No evidence of bowel obstruction. Vascular/Lymphatic: No evidence of aneurysm or adenopathy. Reproductive: Central fibroid with rim calcifications measures up to 4.7 cm. No adnexal mass. Other: Interval removal of previously seen left lower quadrant pigtail drainage catheter. Overall, left lower quadrant fluid collection has improved, but continued fluid collection  in the left retroperitoneum, left psoas muscle, and left lateral abdominal wall musculature compatible with residual abscesses. New fluid collection in the cul-de-sac and wrapping around the uterus concerning for abscess. Musculoskeletal: No acute bony abnormality. IMPRESSION: Continued left retroperitoneal abscesses inferior to the left kidney, involving the left psoas muscle and left abdominal wall musculature. Overall size is decreased since prior study. Interval removal of left lower quadrant abscess drainage catheter. New fluid collection in the cul-de-sac and wrapping around the uterus concerning for  abscess. Cholelithiasis. Small left pleural effusion.  Bibasilar atelectasis. Stable left adrenal adenoma. Electronically Signed   By: Rolm Baptise M.D.   On: 04/29/2021 14:29   CT ABDOMEN PELVIS W CONTRAST  Result Date: 04/10/2021 CLINICAL DATA:  High probability PE. Being currently treated for abscess diverticulitis. Tachycardia. EXAM: CT ANGIOGRAPHY CHEST CT ABDOMEN AND PELVIS WITH CONTRAST TECHNIQUE: Multidetector CT imaging of the chest was performed using the standard protocol during bolus administration of intravenous contrast. Multiplanar CT image reconstructions and MIPs were obtained to evaluate the vascular anatomy. Multidetector CT imaging of the abdomen and pelvis was performed using the standard protocol during bolus administration of intravenous contrast. CONTRAST:  14mL OMNIPAQUE IOHEXOL 350 MG/ML SOLN COMPARISON:  CT abdomen and pelvis 04/01/2021. CT abdomen and pelvis 11/27/2020. FINDINGS: CTA CHEST FINDINGS Cardiovascular: Satisfactory opacification of the pulmonary arteries to the segmental level. No evidence of pulmonary embolism. Normal heart size. No pericardial effusion. Mediastinum/Nodes: No enlarged mediastinal, hilar, or axillary lymph nodes. Thyroid gland, trachea, and esophagus demonstrate no significant findings. Lungs/Pleura: Lungs are clear. No pleural effusion or pneumothorax.  Musculoskeletal: No chest wall abnormality. No acute or significant osseous findings. Review of the MIP images confirms the above findings. CT ABDOMEN and PELVIS FINDINGS Hepatobiliary: There is diffuse fatty infiltration of the liver. Gallstones are present. There is no biliary ductal dilatation. Pancreas: Unremarkable. No pancreatic ductal dilatation or surrounding inflammatory changes. Spleen: Normal in size without focal abnormality. Adrenals/Urinary Tract: There is a 3.5 x 2.8 cm stable left adrenal nodule previously characterized as adenoma. Right adrenal gland is within normal limits. There is a cyst in the inferior pole the left kidney measuring 3 cm, unchanged. The kidneys otherwise appear within normal limits. The bladder is within normal limits. Stomach/Bowel: There is no evidence for bowel obstruction. There is marked focal wall thickening of the mid descending colon with surrounding inflammatory stranding. This has slightly increased when compared to the prior study. There is a small amount of extraluminal gas compatible with perforation. Gas has increased when compared to the prior examination. Stomach is within normal limits. Vascular/Lymphatic: No significant vascular findings are present. Aorta and IVC are normal in size. There are prominent and enlarged central mesenteric lymph nodes which are new from the prior examination. The largest lymph node measures 1.5 by 2.4 cm image 4/49. Reproductive: Again seen is a hypodense lesion in the fundus of the uterus containing some calcifications measuring 4.7 cm similar to the prior study. Adnexa are within normal limits. Other: Previously identified percutaneous drainage catheter in the anterior left abdomen has been pulled back in the interval. The distal catheter tip is in the deep subcutaneous soft tissues of the anterior abdominal wall. Previously identified left lateral percutaneous drainage catheter is unchanged in position. The distal portion of the  catheter is in a new lobulated enhancing fluid collection containing air abutting the inflamed portion of the colon. This collection measures 8.5 x 3.5 x 10.0 cm and is now within the adjacent iliopsoas musculature and abutting the left iliacus musculature. This fluid collection also extends through the left lateral abdominal wall on image 4/51, a new finding. There is trace free fluid in the pelvis. There is no focal abdominal wall hernia. Musculoskeletal: No acute or significant osseous findings. Review of the MIP images confirms the above findings. IMPRESSION: 1. Again seen are findings compatible with descending colon diverticulitis with perforation. Free air and inflammation have increased in the interval. 2. New lobulated enhancing fluid collection posterior to this segment of  inflamed colon measuring 8.5 x 3.5 x 10.0 cm. This collection now invades the adjacent iliopsoas muscle as well as extends through the left lateral abdominal wall. The tip of the drainage catheter is in this collection. Findings are compatible with abscess. 3. Anterior left percutaneous drainage catheter tip has been pulled back and is now within the subcutaneous tissues. 4. Trace free fluid. 5. No pulmonary embolism.  No acute cardiopulmonary process. 6. Cholelithiasis. 7. Fatty infiltration of the liver. Electronically Signed   By: Ronney Asters M.D.   On: 04/10/2021 19:01   CT Abdomen Pelvis W Contrast  Result Date: 04/03/2021 CLINICAL DATA:  Diverticulitis, status post drainage. EXAM: CT ABDOMEN AND PELVIS WITH CONTRAST TECHNIQUE: Multidetector CT imaging of the abdomen and pelvis was performed using the standard protocol following bolus administration of intravenous contrast. CONTRAST:  33mL OMNIPAQUE IOHEXOL 350 MG/ML SOLN COMPARISON:  CT abdomen and pelvis 02/04/2021; X-ray chest 03/31/2016. FINDINGS: Lower chest: No acute abnormality. Hepatobiliary: Hepatic steatosis. Cholelithiasis. No biliary dilatation. Pancreas:  Unremarkable. No pancreatic ductal dilatation or surrounding inflammatory changes. Spleen: Normal in size without focal abnormality. Adrenals/Urinary Tract: Stable 3.7 cm left adrenal lesion. Right adrenal gland appears normal. No hydronephrosis or renal obstruction is noted. Stable left renal cyst. No renal or ureteral calculi are noted. Urinary bladder is unremarkable. Stomach/Bowel: The stomach is unremarkable. The surgical drain seen within a small bowel loop in the left lower quadrant on prior exam has been retracted and no longer appears to be within the bowel or peritoneal space. There appears to be significant enhancement and irregularity involving the descending colon with a large amount of scarring and retraction involving adjacent small bowel loops. This does not appear to be significantly changed compared to prior exam and is consistent with a history of diverticulitis. Stable position of percutaneous drainage catheter is seen with tip adjacent to the left psoas muscle. No significant fluid collection remains. Stool is noted in the more distal sigmoid colon and rectum. Vascular/Lymphatic: There appears to be some rotation of mesenteric structures in the right lower quadrant which may be new since prior exam, suggesting partial volvulus or malrotation. There is also noted the interval development of several mesenteric lymph nodes in this area, most likely inflammatory or reactive in etiology. However, no significant bowel wall thickening or dilatation is seen in this area. Reproductive: Calcified uterine fibroid is again noted. No adnexal abnormality is noted. Other: Mild amount of free fluid is noted in the pelvis. No hernia is noted. Musculoskeletal: No acute or significant osseous findings. IMPRESSION: There appears to be significant enhancement and wall thickening involving the descending colon with some degree of traction and involvement of adjacent small bowel loops. This is consistent with the  history of diverticulitis and perforation. Stable position of percutaneous drainage catheter is seen adjacent to left psoas muscle with no significant residual fluid remaining. The other percutaneous drainage catheter that was previously noted to be within small bowel loop on prior exam in the left lower quadrant, has significantly retracted and appears to be outside of the peritoneal space at this time. Since the prior exam, there does appear to be some degree of rotation involving mesenteric vessels and structures in the right lower quadrant, suggesting partial volvulus or malrotation. There is the interval development of several lymph nodes in this area, most likely inflammatory or reactive in etiology. Mild amount of free fluid is also noted in the pelvis. However, no significant bowel wall thickening or dilatation is seen in this area.  These results will be called to the ordering clinician or representative by the Radiologist Assistant, and communication documented in the PACS or zVision Dashboard. Stable uterine fibroid. Hepatic steatosis. 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 Cholelithiasis. Electronically Signed   By: Marijo Conception M.D.   On: 04/03/2021 21:53   IR Catheter Tube Change  Result Date: 04/14/2021 CLINICAL DATA:  Large intra-abscess. malfunctioning indwelling drain. EXAM: IR DRAINAGE CATHETER EVALUATION, EXCHANGE AND UPSIZE COMPARISON:  CT Abdomen Pelvis, 04/11/2019. IR fluoroscopy, 02/19/2021. CONTRAST:  10 mL Omnipaque 300-administered via the percutaneous drainage catheter. MEDICATIONS: None. ANESTHESIA/SEDATION: Local anesthetic was administered. FLUOROSCOPY TIME:  3 minutes 33 seconds.  10 mGy. TECHNIQUE: Patient was positioned supine on the fluoroscopy table. The external portion of the existing percutaneous drainage catheter as well as the surrounding skin was prepped and draped in usual sterile fashion. A preprocedural spot fluoroscopic image was obtained of the existing percutaneous  drainage catheter. A small amount of contrast was injected via the existing percutaneous drainage catheter and several fluoroscopic images were obtained in various obliquities. The external portion of the percutaneous drainage catheter was cut and cannulated with a short Amplatz wire. Under intermittent fluoroscopic guidance, the existing percutaneous drainage catheter was exchanged for a new 31 French percutaneous drainage catheter with end coiled and locked within the decompressed abscess cavity. Contrast injection confirmed appropriate position functionality of the percutaneous drainage catheter. The percutaneous drainage catheter was connected to a gravity drainage bag and secured in place within 0-silk interrupted suture and a StatLock device. A dressing was applied. The patient tolerated the procedure well without immediate postprocedural complication. FINDINGS: 1. Malfunctioning drain with large residual intra-abdominal abscess. 2. Multi compartment abscess, with patient unable to tolerate further investigation and multiple drain placement. IMPRESSION: Successful fluoroscopic-guided LEFT lower quadrant drainage catheter exchange and upsize, as above. PLAN: Continue with previous drain care, including routine flushes for catheter patency. Interventional radiology will follow patient. Drain interrogation to be performed in 2 weeks, or if earlier suspected catheter malfunction. Michaelle Birks, MD [facility] Electronically Signed   By: Michaelle Birks M.D.   On: 04/14/2021 10:52   DG CHEST PORT 1 VIEW  Result Date: 04/16/2021 CLINICAL DATA:  Respiratory failure EXAM: PORTABLE CHEST 1 VIEW COMPARISON:  Chest x-ray 04/15/2021 FINDINGS: Endotracheal tube tip is 2.7 cm above the carina. Enteric tube tip is in the distal stomach region. Right PICC tip is near the superior aspect of the SVC. Heart size and mediastinum are within normal limits. No consolidation  or edema. No pleural effusion or pneumothorax. Small subcutaneous emphysema bilaterally appears similar to slightly decreased since previous study. IMPRESSION: Medical devices as described.  No acute process identified. Electronically Signed   By: Ofilia Neas M.D.   On: 04/16/2021 11:46   DG CHEST PORT 1 VIEW  Result Date: 04/15/2021 CLINICAL DATA:  Intubation. EXAM: PORTABLE CHEST 1 VIEW COMPARISON:  Radiographs 04/12/2021 and 04/10/2021.  CT 04/10/2021. FINDINGS: 1713 hours. Tip of the endotracheal tube overlies the mid trachea. An enteric tube projects below the diaphragm into the right upper quadrant of the abdomen, likely in the distal stomach. Right arm PICC projects to the level of the superior SVC. There is mild patient rotation to the right and elevation of the right hemidiaphragm associated with increased right basilar subsegmental atelectasis. The lungs are otherwise clear. There is no pleural effusion or pneumothorax. Suggested soft tissue emphysema within the abdominal flanks bilaterally. IMPRESSION: 1. Satisfactory position of the endotracheal and  enteric tubes. 2. Right arm PICC appears to have been pulled back to the level of the upper SVC. 3. Suggested soft tissue emphysema within both abdominal flanks. Electronically Signed   By: Richardean Sale M.D.   On: 04/15/2021 19:26   DG CHEST PORT 1 VIEW  Result Date: 04/12/2021 CLINICAL DATA:  PICC placement EXAM: PORTABLE CHEST 1 VIEW COMPARISON:  04/10/2021 FINDINGS: Right arm PICC tip in the proximal SVC. Lungs remain clear without infiltrate or effusion. IMPRESSION: PICC tip in the proximal SVC.  Lungs are clear Electronically Signed   By: Franchot Gallo M.D.   On: 04/12/2021 19:55   DG Chest Portable 1 View  Result Date: 04/10/2021 CLINICAL DATA:  Weakness. EXAM: PORTABLE CHEST 1 VIEW COMPARISON:  March 31, 2016 FINDINGS: The heart size and mediastinal contours are within normal limits. Both lungs are clear. The visualized skeletal  structures are unremarkable. IMPRESSION: No active disease. Electronically Signed   By: Virgina Norfolk M.D.   On: 04/10/2021 15:51   ECHOCARDIOGRAM COMPLETE  Result Date: 04/14/2021    ECHOCARDIOGRAM REPORT   Patient Name:   Mikayla Rios Date of Exam: 04/14/2021 Medical Rec #:  [id number]      Height:       63.0 in Accession #:    [id number]     Weight:       200.0 lb Date of Birth:  01-01-1986       BSA:          1.934 m Patient Age:    13 years       BP:           109/82 mmHg Patient Gender: F              HR:           133 bpm. Exam Location:  Inpatient Procedure: 2D Echo, Cardiac Doppler and Color Doppler Indications:    I47.2 Ventricular tachycardia  History:        Patient has prior history of Echocardiogram examinations, most                 recent 11/09/2019. Arrythmias:Prolonged QT interval; Risk                 Factors:Hypertension.  Sonographer:    Glo Herring Referring Phys: Theola Sequin IMPRESSIONS  1. Left ventricular ejection fraction, by estimation, is 60 to 65%. The left ventricle has normal function. The left ventricle has no regional wall motion abnormalities. Indeterminate diastolic filling due to E-A fusion.  2. Right ventricular systolic function is normal. The right ventricular size is normal.  3. The mitral valve is normal in structure. No evidence of mitral valve regurgitation. No evidence of mitral stenosis.  4. The aortic valve is normal in structure. Aortic valve regurgitation is not visualized. No aortic stenosis is present.  5. The inferior vena cava is normal in size with greater than 50% respiratory variability, suggesting right atrial pressure of 3 mmHg. FINDINGS  Left Ventricle: Left ventricular ejection fraction, by estimation, is 60 to 65%. The left ventricle has normal function. The left ventricle has no regional wall motion abnormalities. The left ventricular internal cavity size was normal in size. There is  no left ventricular hypertrophy. Indeterminate diastolic  filling due to E-A fusion. Right Ventricle: The right ventricular size is normal. No increase in right ventricular wall thickness. Right ventricular systolic function is normal. Left Atrium: Left atrial size was normal in size. Right Atrium: Right  atrial size was normal in size. Pericardium: There is no evidence of pericardial effusion. Mitral Valve: The mitral valve is normal in structure. No evidence of mitral valve regurgitation. No evidence of mitral valve stenosis. Tricuspid Valve: The tricuspid valve is normal in structure. Tricuspid valve regurgitation is not demonstrated. No evidence of tricuspid stenosis. Aortic Valve: The aortic valve is normal in structure. Aortic valve regurgitation is not visualized. No aortic stenosis is present. Aortic valve mean gradient measures 4.0 mmHg. Aortic valve peak gradient measures 8.6 mmHg. Pulmonic Valve: The pulmonic valve was normal in structure. Pulmonic valve regurgitation is not visualized. No evidence of pulmonic stenosis. Aorta: The aortic root is normal in size and structure. Venous: The inferior vena cava is normal in size with greater than 50% respiratory variability, suggesting right atrial pressure of 3 mmHg. IAS/Shunts: No atrial level shunt detected by color flow Doppler.  LEFT VENTRICLE PLAX 2D LVIDd:         3.80 cm LVIDs:         2.30 cm LV PW:         1.00 cm LV IVS:        1.00 cm  RIGHT VENTRICLE RV S prime:     21.90 cm/s LEFT ATRIUM             Index LA diam:        3.50 cm 1.81 cm/m LA Vol (A2C):   32.1 ml 16.60 ml/m LA Vol (A4C):   29.5 ml 15.26 ml/m LA Biplane Vol: 32.3 ml 16.70 ml/m  AORTIC VALVE                   PULMONIC VALVE AV Vmax:           147.00 cm/s PV Vmax:       1.21 m/s AV Vmean:          96.600 cm/s PV Peak grad:  5.9 mmHg AV VTI:            0.168 m AV Peak Grad:      8.6 mmHg AV Mean Grad:      4.0 mmHg LVOT Vmax:         85.50 cm/s LVOT Vmean:        53.100 cm/s LVOT VTI:          0.097 m LVOT/AV VTI ratio: 0.58  AORTA Ao Root  diam: 3.00 cm Ao Asc diam:  2.90 cm  SHUNTS Systemic VTI: 0.10 m Dani Gobble Croitoru MD Electronically signed by Sanda Klein MD Signature Date/Time: 04/14/2021/1:18:01 PM    Final    VAS [place] UPPER EXTREMITY VENOUS DUPLEX  Result Date: 04/20/2021 UPPER VENOUS STUDY  Patient Name:  LAYNEE LOCKAMY Schnepp  Date of Exam:   04/19/2021 Medical Rec #: [id number]       Accession #:    [id number] Date of Birth: [birth year]        Patient Gender: F Patient Age:   1 years Exam Location:  [facility] Procedure:      VAS [place] UPPER EXTREMITY VENOUS DUPLEX Referring Phys: Jolaine Artist MEMON --------------------------------------------------------------------------------  Indications: Edema Limitations: Poor ultrasound/tissue interface and body habitus. Comparison Study: No prior study Performing Technologist: Maudry Mayhew MHA, RDMS, RVT, RDCS  Examination Guidelines: A complete evaluation includes B-mode imaging, spectral Doppler, color Doppler, and power Doppler as needed of all accessible portions of each vessel. Bilateral testing is considered an integral part of a complete examination. Limited examinations for reoccurring indications may be performed  as noted.  Right Findings: +----------+------------+---------+-----------+----------+-------+ RIGHT     CompressiblePhasicitySpontaneousPropertiesSummary +----------+------------+---------+-----------+----------+-------+ Subclavian               Yes       Yes                      +----------+------------+---------+-----------+----------+-------+  Left Findings: +----------+------------+---------+-----------+----------+--------------+ LEFT      CompressiblePhasicitySpontaneousProperties   Summary     +----------+------------+---------+-----------+----------+--------------+ IJV           Full       Yes       Yes                             +----------+------------+---------+-----------+----------+--------------+ Subclavian    Full       Yes       Yes                              +----------+------------+---------+-----------+----------+--------------+ Axillary      Full       Yes       Yes                             +----------+------------+---------+-----------+----------+--------------+ Brachial      Full       Yes       Yes                             +----------+------------+---------+-----------+----------+--------------+ Radial        Full                                                 +----------+------------+---------+-----------+----------+--------------+ Ulnar         Full                                                 +----------+------------+---------+-----------+----------+--------------+ Cephalic      Full                                                 +----------+------------+---------+-----------+----------+--------------+ Basilic                                             Not visualized +----------+------------+---------+-----------+----------+--------------+  Summary:  Right: No evidence of thrombosis in the subclavian.  Left: No evidence of deep vein thrombosis in the upper extremity. No evidence of superficial vein thrombosis involving visualized veins of the left upper extremity.  *See table(s) above for measurements and observations.  Diagnosing physician: Monica Martinez MD Electronically signed by Monica Martinez MD on 04/20/2021 at 11:59:48 AM.    Final    [place] EKG Site Rite  Result Date: 04/17/2021 If Site Rite image not attached, placement could not be confirmed due to current cardiac rhythm.  [place] EKG SITE RITE  Result Date: 04/12/2021 If Site Rite image not attached, placement could not be confirmed due to current cardiac rhythm.   Labs:  CBC: Recent Labs    04/26/21 0515 04/27/21 0357 04/28/21 0449 04/29/21 0230  WBC 14.1* 13.2* 11.1* 12.6*  HGB 9.9* 9.7* 9.6* 9.8*  HCT 30.7* 30.0* 30.5* 31.0*  PLT 652* 716* 751* 828*    COAGS: Recent Labs    11/07/20 1820  04/11/21 0302 04/19/21 1500  INR 1.3* 1.2 1.2  APTT  --  28 35    BMP: Recent Labs    04/26/21 0515 04/27/21 0357 04/28/21 0449 04/29/21 0230  NA 134* 138 140 139  K 4.0 3.4* 3.5 3.9  CL 95* 98 101 103  CO2 32 32 30 27  GLUCOSE 93 89 93 86  BUN 18 15 14 17   CALCIUM 8.3* 8.3* 8.6* 8.7*  CREATININE 0.41* 0.55 0.50 0.56  GFRNONAA >60 >60 >60 >60    LIVER FUNCTION TESTS: Recent Labs    04/26/21 0515 04/27/21 0357 04/28/21 0449 04/29/21 0230  BILITOT 0.7 0.6 0.4 0.4  AST 15 15 12* 13*  ALT 12 13 13 11   ALKPHOS 87 84 75 72  PROT 6.4* 6.6 6.6 7.1  ALBUMIN 2.5* 2.5* 2.4* 2.6*      Assessment and Plan:  Perforated Diverticulitis --abscess formation in pelvis --fluid collection amenable to draining --will make NPO --Patient and spouse in agreement to proceed.  Risks and benefits discussed with the patient including bleeding, infection, damage to adjacent structures, bowel perforation/fistula connection, and sepsis.  All of the patient's questions were answered, patient is agreeable to proceed. Consent signed and in chart.   Thank you for this interesting consult.  I greatly enjoyed meeting JURNI CESARO and look forward to participating in their care.  A copy of this report was sent to the requesting provider on this date.  Electronically Signed: Pasty Spillers, PA 04/29/2021, 3:54 PM   I spent a total of 55 Mi[place]  in face to face in clinical consultation, greater than 50% of which was counseling/coordinating care for pelvic abscess

## 2021-04-29 NOTE — Plan of Care (Signed)

## 2021-04-29 NOTE — Consult Note (Signed)
[place] Nurse wound follow up Patient receiving care in [place]. Patient premedicated prior to removal and replacement of VAC dressing. Wound type: sugical Measurement: na Wound bed: 100% pink Drainage (amount, consistency, odor) serosanginous in cannister Periwound: intact Dressing procedure/placement/frequency: One piece of black foam removed from abdominal wound, one piece of black foam placed. Drape applied, immediate seal obtained.  The patient will not likely need further IV pain medication prior to future dressing changes.  The process was so easy and completed so quickly, she did not feel any discomfort.  Due to the simplicity of the VAC change, I am turning this over to the bedside nursing staff.  The ostomy pouch placed 2 days ago did not require changing.  Val Riles, RN, MSN, CWOCN, CNS-BC, pager [phone]

## 2021-04-30 LAB — GLUCOSE, CAPILLARY
Glucose-Capillary: 105 mg/dL — ABNORMAL HIGH (ref 70–99)
Glucose-Capillary: 145 mg/dL — ABNORMAL HIGH (ref 70–99)
Glucose-Capillary: 74 mg/dL (ref 70–99)
Glucose-Capillary: 91 mg/dL (ref 70–99)
Glucose-Capillary: 95 mg/dL (ref 70–99)
Glucose-Capillary: 97 mg/dL (ref 70–99)

## 2021-04-30 MED ORDER — METOPROLOL TARTRATE 25 MG PO TABS
12.5000 mg | ORAL_TABLET | Freq: Every day | ORAL | Status: DC
  Administered 2021-04-30: 12.5 mg via ORAL
  Filled 2021-04-30: qty 1

## 2021-04-30 NOTE — Progress Notes (Addendum)
PROGRESS NOTE    Mikayla Rios  CMK:[id number] DOB: Jan 30, 1986 DOA: 04/10/2021 PCP: [name], MD    Chief Complaint  Patient presents with   Weakness   Tachycardia    Brief Narrative:  H/o HTN, obesity , osa ,diverticulosis with complicated diverticulitis and abscess formation 11/2020 status post IR guided placement of 2 left lower quadrant drains who presents to [facility] emergency department with complaints of generalized weakness and lightheadedness. 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  Subjective:  Reports feeling better, denies pain, no fever, but wbc  is trending up Report have some meat abdomen pain and left side pain, she thinks is from increased activity Urine is very clear in canister  She is talking to her mother on the phone  Assessment & Plan:   Principal Problem:   Diverticulitis of large intestine with abscess Active Problems:   Class 3 severe obesity due to excess calories with serious comorbidity and body mass index (BMI) of 60.0 to 69.9 in adult [facility])   Obstructive sleep apnea   Anemia   Inappropriate sinus tachycardia   Essential hypertension   SIRS (systemic inflammatory response syndrome) (HCC)   Hypokalemia, inadequate intake   Protein-calorie malnutrition, severe (HCC)   Reactive thrombocytosis   Prolonged QT interval   Malignant neoplasm of sigmoid colon ([place])  CHRONIC PERFORATED COLON WITH RETROPERITONEAL ABSCESS PARTIAL SMALL BOWEL OBSTRUCTION PARTIAL COLON OBSTRUCTION H/o Complicated diverticulitis with abscess formation and drain placement in June 2022 S/p LAPAROSCOPIC ASSISTED HARTMANN  (LEFT COLECTOMY / END COLOSTOMY)  -wound vac -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 Wbc start to trend up, repeat ct scan concerning for fluids collection , management per general surgery, appreciate input, she is continued on meropenem  Septic shock  Secondary to retroperitoneal abscess -Patient required vasopressor therapy postoperatively,  but has been able to wean off pressors  Acute respiratory failure with hypoxia requiring mechanical ventilation -Patient was intubated for operative management and due to hemodynamic instability postoperatively, decision was made to leave the patient intubated and monitored in the ICU -She was able to extubate on 11/10 and respiratory status has been stable  Blood loss anemia, post op Required prbc transfusion x10units, on lasix for volume overload, no edema on exam, CT ab on 11/13 showed small left pleural effusion, might be approaching to euvolemic, may need to change lasix to every otherday in the next few days  Tachycardia/palpitations:  seen by cardiology s/p Zio patch x3 days on 11/12/2019 showed no significant arrhythmias Tsh unremarkable Possible reactive, She is started on betablocker , add pm dose since persistent tachycardia  with elevated blood pressure  HTN, hold home medication norvasc, lisinopril/HCTZ, may improvement to increase metoprolol for heart rate control   Nutritional Assessment:  The patient's BMI is: Body mass index is 39.99 kg/m.Marland Kitchen  Seen by dietician.  I agree with the assessment and plan as outlined below:  Nutrition Status: Nutrition Problem: Inadequate oral intake Etiology: acute illness Signs/Symptoms: per patient/family report Interventions: Ensure Enlive (each supplement provides 350kcal and 20 grams of protein)  .     Unresulted Labs (From admission, onward)     Start     Ordered   05/01/21 5697  Basic metabolic panel  Tomorrow morning,   R       Question:  Specimen collection method  Answer:  IV Team=IV Team collect   04/30/21 0758   05/01/21 0000  CBC  Tomorrow morning,   R       Question:  Specimen collection  method  Answer:  IV Team=IV Team collect   04/30/21 0758   04/11/21 1617  Vitamin B12  (Anemia Panel (PNL))  Once,   R       Question:  Specimen collection method  Answer:  IV Team=IV Team collect   04/11/21 1616   04/11/21 1617   Iron and TIBC  (Anemia Panel (PNL))  Once,   R       Question:  Specimen collection method  Answer:  IV Team=IV Team collect   04/11/21 1616   04/11/21 1617  Ferritin  (Anemia Panel (PNL))  Once,   R       Question:  Specimen collection method  Answer:  IV Team=IV Team collect   04/11/21 1616   Signed and Held  Comprehensive metabolic panel  Tomorrow morning,   R       Question:  Specimen collection method  Answer:  IV Team=IV Team collect   Signed and Held   Signed and Held  CBC WITH DIFFERENTIAL  Tomorrow morning,   R       Question:  Specimen collection method  Answer:  IV Team=IV Team collect   Signed and Held              DVT prophylaxis: Place and maintain sequential compression device Start: 04/18/21 1053 SCDs Start: 04/10/21 2059   Code Status:full Family Communication: patient Disposition:   Status is: Inpatient  Dispo: The patient is from: home              Anticipated d/c is to: CIR              Anticipated d/c date is: needs general surgery clearance                 Consultants:  General surgery Oncology cir  Procedures:  As above   Antimicrobials:    Anti-infectives (From admission, onward)    Start     Dose/Rate Route Frequency Ordered Stop   04/29/21 1545  meropenem (MERREM) 1 g in sodium chloride 0.9 % 100 mL IVPB        1 g 200 mL/hr over 30 Minutes Intravenous Every 8 hours 04/29/21 1453     04/17/21 1700  ceFEPIme (MAXIPIME) 2 g in sodium chloride 0.9 % 100 mL IVPB  Status:  Discontinued        2 g 200 mL/hr over 30 Minutes Intravenous Every 8 hours 04/17/21 1530 04/21/21 0808   04/17/21 1700  metroNIDAZOLE (FLAGYL) IVPB 500 mg  Status:  Discontinued        500 mg 100 mL/hr over 60 Minutes Intravenous Every 12 hours 04/17/21 1530 04/21/21 0808   04/15/21 1000  anidulafungin (ERAXIS) 100 mg in sodium chloride 0.9 % 100 mL IVPB  Status:  Discontinued        100 mg 78 mL/hr over 100 Minutes Intravenous Every 24 hours 04/14/21 0744 04/21/21  0808   04/15/21 0900  clindamycin (CLEOCIN) IVPB 900 mg       See Hyperspace for full Linked Orders Report.   900 mg 100 mL/hr over 30 Minutes Intravenous 60 min pre-op 04/14/21 1405 04/15/21 1142   04/15/21 0900  gentamicin (GARAMYCIN) 340 mg in dextrose 5 % 100 mL IVPB       See Hyperspace for full Linked Orders Report.   5 mg/kg  67.7 kg (Adjusted) 108.5 mL/hr over 60 Minutes Intravenous 60 min pre-op 04/14/21 1405 04/15/21 1834   04/14/21 1000  anidulafungin (ERAXIS) 200  mg in sodium chloride 0.9 % 200 mL IVPB        200 mg 78 mL/hr over 200 Minutes Intravenous  Once 04/14/21 0755 04/14/21 1500   04/13/21 0900  meropenem (MERREM) 1 g in sodium chloride 0.9 % 100 mL IVPB  Status:  Discontinued        1 g 200 mL/hr over 30 Minutes Intravenous Every 8 hours 04/13/21 0800 04/17/21 1530   04/10/21 2200  cefTRIAXone (ROCEPHIN) 2 g in sodium chloride 0.9 % 100 mL IVPB  Status:  Discontinued        2 g 200 mL/hr over 30 Minutes Intravenous Daily at bedtime 04/10/21 2044 04/13/21 0746   04/10/21 2200  metroNIDAZOLE (FLAGYL) IVPB 500 mg  Status:  Discontinued        500 mg 100 mL/hr over 60 Minutes Intravenous Every 12 hours 04/10/21 2044 04/13/21 0745          Objective: Vitals:   04/29/21 1643 04/29/21 2105 04/29/21 2200 04/30/21 0517  BP:  134/80  (!) 141/96  Pulse:  (!) 120  (!) 102  Resp: 20 20 20 20   Temp:  98.1 F (36.7 C)  97.7 F (36.5 C)  TempSrc:  Oral  Oral  SpO2:  99%  100%  Weight:      Height:        Intake/Output Summary (Last 24 hours) at 04/30/2021 0915 Last data filed at 04/30/2021 0600 Gross per 24 hour  Intake 717.96 ml  Output 275 ml  Net 442.96 ml   Filed Weights   04/15/21 1800 04/23/21 1752 04/24/21 0700  Weight: 99.5 kg 100.5 kg 102.4 kg    Examination:  General exam: alert, awake, communicative,calm, NAD Respiratory system: Clear to auscultation. Respiratory effort normal. Cardiovascular system:  slightly sinus tachycardia   Gastrointestinal system: Abdomen  + colostomy, + vac , nontender.  Normal bowel sounds heard. Central nervous system: Alert and oriented. No focal neurological deficits. Extremities:  no edema Skin: No rashes, lesions or ulcers Psychiatry: Judgement and insight appear normal. Mood & affect appropriate.     Data Reviewed: I have personally reviewed following labs and imaging studies  CBC: Recent Labs  Lab 04/25/21 0530 04/25/21 2258 04/26/21 0515 04/27/21 0357 04/28/21 0449 04/29/21 0230  WBC 14.2*  --  14.1* 13.2* 11.1* 12.6*  NEUTROABS 10.9*  --  10.8* 9.9* 7.9* 9.5*  HGB 6.5* 9.7* 9.9* 9.7* 9.6* 9.8*  HCT 20.8* 29.8* 30.7* 30.0* 30.5* 31.0*  MCV 93.7  --  86.7 88.5 89.7 91.2  PLT 709*  --  652* 716* 751* 828*    Basic Metabolic Panel: Recent Labs  Lab 04/25/21 0530 04/26/21 0515 04/27/21 0357 04/28/21 0449 04/29/21 0230  NA 136 134* 138 140 139  K 4.7 4.0 3.4* 3.5 3.9  CL 99 95* 98 101 103  CO2 31 32 32 30 27  GLUCOSE 106* 93 89 93 86  BUN 26* 18 15 14 17   CREATININE 0.35* 0.41* 0.55 0.50 0.56  CALCIUM 8.5* 8.3* 8.3* 8.6* 8.7*  MG 2.1 1.8 2.0 1.8 2.4  PHOS 5.3* 5.5* 4.9* 4.2 4.6    GFR: Estimated Creatinine Clearance: 110 mL/min (by C-G formula based on SCr of 0.56 mg/dL).  Liver Function Tests: Recent Labs  Lab 04/25/21 0530 04/26/21 0515 04/27/21 0357 04/28/21 0449 04/29/21 0230  AST 12* 15 15 12* 13*  ALT 10 12 13 13 11   ALKPHOS 108 87 84 75 72  BILITOT 0.3 0.7 0.6 0.4 0.4  PROT  5.8* 6.4* 6.6 6.6 7.1  ALBUMIN 2.3* 2.5* 2.5* 2.4* 2.6*    CBG: Recent Labs  Lab 04/29/21 1715 04/30/21 0002 04/30/21 0534 04/30/21 0613 04/30/21 0737  GLUCAP 75 145* 91 105* 97     No results found for this or any previous visit (from the past 240 hour(s)).       Radiology Studies: CT ABDOMEN PELVIS W CONTRAST  Addendum Date: 04/29/2021   ADDENDUM REPORT: 04/29/2021 16:59 ADDENDUM: After discussing the case with Dr. Dwaine Gale in interventional  radiology, it was noted that the fluid in the pelvis originally thought to be in the cul-de-sac is likely within the vagina, best seen on sagittal imaging. Recommend speculum exam. Also, in the left lateral wall in the area of prior abscess in the lateral wall abdominal musculature, some of the abdomen soft tissue appears to be enhancing. While this could be infectious, cannot exclude tumor seeding in the left lateral abdominal wall, measuring 6.9 x 3.8 cm on image 64 of series 2. Electronically Signed   By: Rolm Baptise M.D.   On: 04/29/2021 16:59   Result Date: 04/29/2021 CLINICAL DATA:  Abdominal abscess/infection suspected. History of diverticulitis repair with colostomy and colon cancer EXAM: CT ABDOMEN AND PELVIS WITH CONTRAST TECHNIQUE: Multidetector CT imaging of the abdomen and pelvis was performed using the standard protocol following bolus administration of intravenous contrast. CONTRAST:  70mL OMNIPAQUE IOHEXOL 350 MG/ML SOLN COMPARISON:  04/10/2021 FINDINGS: Lower chest: Small left pleural effusion. Compressive atelectasis in the left lower lobe. Right base atelectasis. Scattered coronary artery calcifications. Hepatobiliary: Gallstones noted within the gallbladder. No focal hepatic abnormality. Pancreas: No focal abnormality or ductal dilatation. Spleen: No focal abnormality.  Normal size. Adrenals/Urinary Tract: Left adrenal mass again noted, measuring 3 cm, unchanged. Right adrenal gland and kidneys unremarkable. Urinary bladder decompressed, grossly unremarkable. Stomach/Bowel: Left lower quadrant ostomy with Hartmann's pouch. No evidence of bowel obstruction. Vascular/Lymphatic: No evidence of aneurysm or adenopathy. Reproductive: Central fibroid with rim calcifications measures up to 4.7 cm. No adnexal mass. Other: Interval removal of previously seen left lower quadrant pigtail drainage catheter. Overall, left lower quadrant fluid collection has improved, but continued fluid collection in the  left retroperitoneum, left psoas muscle, and left lateral abdominal wall musculature compatible with residual abscesses. New fluid collection in the cul-de-sac and wrapping around the uterus concerning for abscess. Musculoskeletal: No acute bony abnormality. IMPRESSION: Continued left retroperitoneal abscesses inferior to the left kidney, involving the left psoas muscle and left abdominal wall musculature. Overall size is decreased since prior study. Interval removal of left lower quadrant abscess drainage catheter. New fluid collection in the cul-de-sac and wrapping around the uterus concerning for abscess. Cholelithiasis. Small left pleural effusion.  Bibasilar atelectasis. Stable left adrenal adenoma. Electronically Signed: By: Rolm Baptise M.D. On: 04/29/2021 14:29        Scheduled Meds:  acetaminophen  1,000 mg Oral Q6H   chlorhexidine  15 mL Mouth/Throat BID   Chlorhexidine Gluconate Cloth  6 each Topical Daily   diclofenac Sodium  4 g Topical QID   feeding supplement  237 mL Oral BID BM   furosemide  40 mg Intravenous Daily   insulin aspart  0-9 Units Subcutaneous Q6H   lip balm  1 application Topical BID   metoprolol tartrate  25 mg Oral BID   pantoprazole  40 mg Oral Daily   Continuous Infusions:  sodium chloride 10 mL/hr at 04/29/21 1815   meropenem (MERREM) IV 1 g (04/30/21 0508)   methocarbamol (  ROBAXIN) IV       LOS: 20 days   Time spent: 57mins Greater than 50% of this time was spent in counseling, explanation of diagnosis, planning of further management, and coordination of care.   Voice Recognition Viviann Spare dictation system was used to create this note, attempts have been made to correct errors. Please contact the author with questions and/or clarifications.   Florencia Reasons, MD PhD FACP [facility]  Available via Epic secure chat 7am-7pm for nonurgent issues Please page for urgent issues To page the attending provider between 7A-7P or the covering provider during  after hours 7P-7A, please log into the web site [email or web address] and access using universal [place] password for that web site. If you do not have the password, please call the hospital operator.    04/30/2021, 9:15 AM

## 2021-04-30 NOTE — Progress Notes (Signed)
Patient ID: Mikayla Rios, female   DOB: [birth year], 35 y.o.   MRN: [id number] I spoke with Dr. Elonda Husky of gynecology regarding suspected fluid felt to be present in the vagina on CT scan yesterday. He feels it is not a clinically significant amount of fluid and would not recommend any intervention.  Wellington Hampshire, [facility] 04/30/2021, 11:01 AM Please see Amion for pager number during day hours 7:00am-4:30pm

## 2021-04-30 NOTE — Progress Notes (Signed)
15 Days Post-Op   Subjective/Chief Complaint: Patient feels "fine" - no complaints Initially after CT scan yesterday, IR was consulted to consider percutaneous drain placement into new fluid collection in pelvis in the cul-de-sac seen on CT scan.  A late revision to the CT scan report shows that a review of the CT scan shows that fluid in the pelvis is "likely within the vagina" with recommendation for GYN speculum examination prior to percutaneous drainage.  If speculum examination is negative, then consider CT-guided drainage.     Objective: Vital signs in last 24 hours: Temp:  [97.7 F (36.5 C)-99.5 F (37.5 C)] 97.7 F (36.5 C) (11/24 0517) Pulse Rate:  [102-120] 102 (11/24 0517) Resp:  [15-20] 20 (11/24 0517) BP: (134-141)/(80-104) 141/96 (11/24 0517) SpO2:  [99 %-100 %] 100 % (11/24 0517) Last BM Date: 04/29/21  Intake/Output from previous day: 11/23 0701 - 11/24 0700 In: 718 [P.O.:120; I.V.:281; IV Piggyback:317] Out: 275 [Stool:275] Intake/Output this shift: No intake/output data recorded.  General appearance: alert and cooperative GI: soft, non-tender; bowel sounds normal; no masses,  no organomegaly and ostomy patent, vac in place  Lab Results:  Recent Labs    04/28/21 0449 04/29/21 0230  WBC 11.1* 12.6*  HGB 9.6* 9.8*  HCT 30.5* 31.0*  PLT 751* 828*   No new labs today  BMET Recent Labs    04/28/21 0449 04/29/21 0230  NA 140 139  K 3.5 3.9  CL 101 103  CO2 30 27  GLUCOSE 93 86  BUN 14 17  CREATININE 0.50 0.56  CALCIUM 8.6* 8.7*   PT/INR No results for input(s): LABPROT, INR in the last 72 hours. ABG No results for input(s): PHART, HCO3 in the last 72 hours.  Invalid input(s): PCO2, PO2  Studies/Results: CT ABDOMEN PELVIS W CONTRAST  Addendum Date: 04/29/2021   ADDENDUM REPORT: 04/29/2021 16:59 ADDENDUM: After discussing the case with Dr. Dwaine Gale in interventional radiology, it was noted that the fluid in the pelvis originally thought to be in  the cul-de-sac is likely within the vagina, best seen on sagittal imaging. Recommend speculum exam. Also, in the left lateral wall in the area of prior abscess in the lateral wall abdominal musculature, some of the abdomen soft tissue appears to be enhancing. While this could be infectious, cannot exclude tumor seeding in the left lateral abdominal wall, measuring 6.9 x 3.8 cm on image 64 of series 2. Electronically Signed   By: Rolm Baptise M.D.   On: 04/29/2021 16:59   Result Date: 04/29/2021 CLINICAL DATA:  Abdominal abscess/infection suspected. History of diverticulitis repair with colostomy and colon cancer EXAM: CT ABDOMEN AND PELVIS WITH CONTRAST TECHNIQUE: Multidetector CT imaging of the abdomen and pelvis was performed using the standard protocol following bolus administration of intravenous contrast. CONTRAST:  64mL OMNIPAQUE IOHEXOL 350 MG/ML SOLN COMPARISON:  04/10/2021 FINDINGS: Lower chest: Small left pleural effusion. Compressive atelectasis in the left lower lobe. Right base atelectasis. Scattered coronary artery calcifications. Hepatobiliary: Gallstones noted within the gallbladder. No focal hepatic abnormality. Pancreas: No focal abnormality or ductal dilatation. Spleen: No focal abnormality.  Normal size. Adrenals/Urinary Tract: Left adrenal mass again noted, measuring 3 cm, unchanged. Right adrenal gland and kidneys unremarkable. Urinary bladder decompressed, grossly unremarkable. Stomach/Bowel: Left lower quadrant ostomy with Hartmann's pouch. No evidence of bowel obstruction. Vascular/Lymphatic: No evidence of aneurysm or adenopathy. Reproductive: Central fibroid with rim calcifications measures up to 4.7 cm. No adnexal mass. Other: Interval removal of previously seen left lower quadrant pigtail drainage catheter.  Overall, left lower quadrant fluid collection has improved, but continued fluid collection in the left retroperitoneum, left psoas muscle, and left lateral abdominal wall  musculature compatible with residual abscesses. New fluid collection in the cul-de-sac and wrapping around the uterus concerning for abscess. Musculoskeletal: No acute bony abnormality. IMPRESSION: Continued left retroperitoneal abscesses inferior to the left kidney, involving the left psoas muscle and left abdominal wall musculature. Overall size is decreased since prior study. Interval removal of left lower quadrant abscess drainage catheter. New fluid collection in the cul-de-sac and wrapping around the uterus concerning for abscess. Cholelithiasis. Small left pleural effusion.  Bibasilar atelectasis. Stable left adrenal adenoma. Electronically Signed: By: Rolm Baptise M.D. On: 04/29/2021 14:29    Anti-infectives: Anti-infectives (From admission, onward)    Start     Dose/Rate Route Frequency Ordered Stop   04/29/21 1545  meropenem (MERREM) 1 g in sodium chloride 0.9 % 100 mL IVPB        1 g 200 mL/hr over 30 Minutes Intravenous Every 8 hours 04/29/21 1453     04/17/21 1700  ceFEPIme (MAXIPIME) 2 g in sodium chloride 0.9 % 100 mL IVPB  Status:  Discontinued        2 g 200 mL/hr over 30 Minutes Intravenous Every 8 hours 04/17/21 1530 04/21/21 0808   04/17/21 1700  metroNIDAZOLE (FLAGYL) IVPB 500 mg  Status:  Discontinued        500 mg 100 mL/hr over 60 Minutes Intravenous Every 12 hours 04/17/21 1530 04/21/21 0808   04/15/21 1000  anidulafungin (ERAXIS) 100 mg in sodium chloride 0.9 % 100 mL IVPB  Status:  Discontinued        100 mg 78 mL/hr over 100 Minutes Intravenous Every 24 hours 04/14/21 0744 04/21/21 0808   04/15/21 0900  clindamycin (CLEOCIN) IVPB 900 mg       See Hyperspace for full Linked Orders Report.   900 mg 100 mL/hr over 30 Minutes Intravenous 60 min pre-op 04/14/21 1405 04/15/21 1142   04/15/21 0900  gentamicin (GARAMYCIN) 340 mg in dextrose 5 % 100 mL IVPB       See Hyperspace for full Linked Orders Report.   5 mg/kg  67.7 kg (Adjusted) 108.5 mL/hr over 60 Minutes  Intravenous 60 min pre-op 04/14/21 1405 04/15/21 1834   04/14/21 1000  anidulafungin (ERAXIS) 200 mg in sodium chloride 0.9 % 200 mL IVPB        200 mg 78 mL/hr over 200 Minutes Intravenous  Once 04/14/21 0755 04/14/21 1500   04/13/21 0900  meropenem (MERREM) 1 g in sodium chloride 0.9 % 100 mL IVPB  Status:  Discontinued        1 g 200 mL/hr over 30 Minutes Intravenous Every 8 hours 04/13/21 0800 04/17/21 1530   04/10/21 2200  cefTRIAXone (ROCEPHIN) 2 g in sodium chloride 0.9 % 100 mL IVPB  Status:  Discontinued        2 g 200 mL/hr over 30 Minutes Intravenous Daily at bedtime 04/10/21 2044 04/13/21 0746   04/10/21 2200  metroNIDAZOLE (FLAGYL) IVPB 500 mg  Status:  Discontinued        500 mg 100 mL/hr over 60 Minutes Intravenous Every 12 hours 04/10/21 2044 04/13/21 0745       Assessment/Plan: S/P LAPAROSCOPIC ASSISTED HARTMANN  (LEFT COLECTOMY / END COLOSTOMY), SMALL BOWEL RESECTION, LEFT RETROPERITONEAL / FLANK ABSCESS DRAINAGE 04/15/21 Dr. [name] - path: Colonic adenocarcinoma. Oncology has seen - wound vac MWF - d/c JP drain 04/28/21 -  tolerating diet and colostomy functioning - WOC following, will need outpatient follow up with ostomy clinic - WBC elevated yesterday  - CT scan showed some smaller residual fluid collections, but new collection in pelvis - (cul-de-sac vs. In the vagina).  Will ask GYN to evaluate with speculum examination.  If the examination is negative, will need percutaneous drainage tomorrow. - Stable for discharge to CIR when bed available   Anemia - hgb stable today at 9.6, last transfusion was on 11/19. Transfuse PRN per primary   ID - maxipime/ flagyl 11/11>>11/15, eraxis 11/8>>11/15, merrem 11/7>> 11/11, rocephin/flagyul 11/4>>11/7 FEN - carb mod diet VTE - SCDs/ per primary, ok for chemical dvt prophylaxis from surgical standpoint   HTN Morbid obesity BMI 39.99    LOS: 20 days    Mikayla Rios 04/30/2021

## 2021-05-01 ENCOUNTER — Inpatient Hospital Stay (HOSPITAL_COMMUNITY)
Admission: RE | Admit: 2021-05-01 | Discharge: 2021-05-15 | DRG: 945 | Disposition: A | Discharge status: Home Health | Payer: BC Managed Care – PPO | Source: Intra-hospital | Attending: Physical Medicine and Rehabilitation | Admitting: Physical Medicine and Rehabilitation

## 2021-05-01 ENCOUNTER — Other Ambulatory Visit: Payer: Self-pay

## 2021-05-01 ENCOUNTER — Encounter (HOSPITAL_COMMUNITY): Payer: Self-pay | Admitting: Physical Medicine and Rehabilitation

## 2021-05-01 DIAGNOSIS — C187 Malignant neoplasm of sigmoid colon: Secondary | ICD-10-CM | POA: Diagnosis not present

## 2021-05-01 DIAGNOSIS — R5381 Other malaise: Principal | ICD-10-CM | POA: Diagnosis present

## 2021-05-01 DIAGNOSIS — K5720 Diverticulitis of large intestine with perforation and abscess without bleeding: Secondary | ICD-10-CM | POA: Diagnosis not present

## 2021-05-01 DIAGNOSIS — Z6839 Body mass index (BMI) 39.0-39.9, adult: Secondary | ICD-10-CM | POA: Diagnosis not present

## 2021-05-01 DIAGNOSIS — Z933 Colostomy status: Secondary | ICD-10-CM | POA: Diagnosis not present

## 2021-05-01 DIAGNOSIS — Z833 Family history of diabetes mellitus: Secondary | ICD-10-CM | POA: Diagnosis not present

## 2021-05-01 DIAGNOSIS — K529 Noninfective gastroenteritis and colitis, unspecified: Secondary | ICD-10-CM | POA: Diagnosis present

## 2021-05-01 DIAGNOSIS — D62 Acute posthemorrhagic anemia: Secondary | ICD-10-CM | POA: Diagnosis not present

## 2021-05-01 DIAGNOSIS — R000 Tachycardia, unspecified: Secondary | ICD-10-CM | POA: Diagnosis not present

## 2021-05-01 DIAGNOSIS — R9431 Abnormal electrocardiogram [ECG] [EKG]: Secondary | ICD-10-CM

## 2021-05-01 DIAGNOSIS — Z8249 Family history of ischemic heart disease and other diseases of the circulatory system: Secondary | ICD-10-CM | POA: Diagnosis not present

## 2021-05-01 DIAGNOSIS — R601 Generalized edema: Secondary | ICD-10-CM | POA: Diagnosis present

## 2021-05-01 DIAGNOSIS — Z800 Family history of malignant neoplasm of digestive organs: Secondary | ICD-10-CM | POA: Diagnosis not present

## 2021-05-01 DIAGNOSIS — I10 Essential (primary) hypertension: Secondary | ICD-10-CM | POA: Diagnosis present

## 2021-05-01 DIAGNOSIS — C189 Malignant neoplasm of colon, unspecified: Secondary | ICD-10-CM | POA: Diagnosis not present

## 2021-05-01 DIAGNOSIS — R269 Unspecified abnormalities of gait and mobility: Secondary | ICD-10-CM | POA: Diagnosis not present

## 2021-05-01 DIAGNOSIS — E876 Hypokalemia: Secondary | ICD-10-CM | POA: Diagnosis not present

## 2021-05-01 DIAGNOSIS — M7989 Other specified soft tissue disorders: Secondary | ICD-10-CM | POA: Diagnosis not present

## 2021-05-01 DIAGNOSIS — R6510 Systemic inflammatory response syndrome (SIRS) of non-infectious origin without acute organ dysfunction: Secondary | ICD-10-CM | POA: Diagnosis not present

## 2021-05-01 LAB — BASIC METABOLIC PANEL
Anion gap: 10 (ref 5–15)
BUN: 20 mg/dL (ref 6–20)
CO2: 28 mmol/L (ref 22–32)
Calcium: 9 mg/dL (ref 8.9–10.3)
Chloride: 101 mmol/L (ref 98–111)
Creatinine, Ser: 0.56 mg/dL (ref 0.44–1.00)
GFR, Estimated: >60 mL/min (ref >60)
Glucose, Bld: 97 mg/dL (ref 70–99)
Potassium: 3.2 mmol/L — ABNORMAL LOW (ref 3.5–5.1)
Sodium: 139 mmol/L (ref 135–145)

## 2021-05-01 LAB — CBC
HCT: 34.4 % — ABNORMAL LOW (ref 36.0–46.0)
Hemoglobin: 10.8 g/dL — ABNORMAL LOW (ref 12.0–15.0)
MCH: 28.3 pg (ref 26.0–34.0)
MCHC: 31.4 g/dL (ref 30.0–36.0)
MCV: 90.3 fL (ref 80.0–100.0)
Platelets: 790 10*3/uL — ABNORMAL HIGH (ref 150–400)
RBC: 3.81 MIL/uL — ABNORMAL LOW (ref 3.87–5.11)
RDW: 15.2 % (ref 11.5–15.5)
WBC: 12.1 10*3/uL — ABNORMAL HIGH (ref 4.0–10.5)
nRBC: 0 % (ref 0.0–0.2)

## 2021-05-01 LAB — GLUCOSE, CAPILLARY
Glucose-Capillary: 115 mg/dL — ABNORMAL HIGH (ref 70–99)
Glucose-Capillary: 88 mg/dL (ref 70–99)
Glucose-Capillary: 89 mg/dL (ref 70–99)
Glucose-Capillary: 92 mg/dL (ref 70–99)
Glucose-Capillary: 96 mg/dL (ref 70–99)

## 2021-05-01 LAB — LACTIC ACID, PLASMA: Lactic Acid, Venous: 0.9 mmol/L (ref 0.5–1.9)

## 2021-05-01 MED ORDER — PANTOPRAZOLE SODIUM 40 MG PO TBEC: 40.0000 mg | DELAYED_RELEASE_TABLET | Freq: Every day | ORAL | Status: DC

## 2021-05-01 MED ORDER — OXYCODONE HCL 10 MG PO TABS: 10.0000 mg | ORAL_TABLET | ORAL | 0 refills | Status: DC | PRN

## 2021-05-01 MED ORDER — INSULIN ASPART 100 UNIT/ML IJ SOLN: 0.0000 [IU] | Freq: Four times a day (QID) | INTRAMUSCULAR | 0 refills | Status: DC

## 2021-05-01 MED ORDER — OXYCODONE HCL 5 MG PO TABS
10.0000 mg | ORAL_TABLET | ORAL | Status: DC | PRN
Start: 2021-05-01 — End: 2021-05-13
  Administered 2021-05-01 – 2021-05-07 (×23): 15 mg via ORAL
  Administered 2021-05-08 (×2): 10 mg via ORAL
  Administered 2021-05-08 – 2021-05-09 (×3): 15 mg via ORAL
  Administered 2021-05-09 – 2021-05-10 (×3): 10 mg via ORAL
  Administered 2021-05-10 (×2): 15 mg via ORAL
  Administered 2021-05-11: 10 mg via ORAL
  Administered 2021-05-11: 15 mg via ORAL
  Administered 2021-05-11: 10 mg via ORAL
  Administered 2021-05-12 – 2021-05-13 (×6): 15 mg via ORAL
  Filled 2021-05-01: qty 3
  Filled 2021-05-01: qty 2
  Filled 2021-05-01 (×10): qty 3
  Filled 2021-05-01 (×2): qty 2
  Filled 2021-05-01 (×10): qty 3
  Filled 2021-05-01 (×2): qty 2
  Filled 2021-05-01 (×2): qty 3
  Filled 2021-05-01: qty 2
  Filled 2021-05-01 (×2): qty 3
  Filled 2021-05-01: qty 2
  Filled 2021-05-01 (×3): qty 3
  Filled 2021-05-01: qty 2
  Filled 2021-05-01 (×7): qty 3
  Filled 2021-05-01: qty 2
  Filled 2021-05-01 (×2): qty 3

## 2021-05-01 MED ORDER — INSULIN ASPART 100 UNIT/ML IJ SOLN: 0.0000 [IU] | Freq: Four times a day (QID) | INTRAMUSCULAR | Status: DC

## 2021-05-01 MED ORDER — LORAZEPAM 0.5 MG PO TABS
0.5000 mg | ORAL_TABLET | Freq: Three times a day (TID) | ORAL | Status: DC | PRN
Start: 2021-05-01 — End: 2021-05-15

## 2021-05-01 MED ORDER — METRONIDAZOLE 500 MG PO TABS: 500.0000 mg | ORAL_TABLET | Freq: Two times a day (BID) | ORAL | Status: DC

## 2021-05-01 MED ORDER — MAGIC MOUTHWASH
15.0000 mL | Freq: Four times a day (QID) | ORAL | Status: DC | PRN
  Filled 2021-05-01: qty 15

## 2021-05-01 MED ORDER — ENSURE ENLIVE PO LIQD
237.0000 mL | Freq: Two times a day (BID) | ORAL | Status: DC
  Administered 2021-05-01 – 2021-05-14 (×26): 237 mL via ORAL

## 2021-05-01 MED ORDER — ALTEPLASE 2 MG IJ SOLR
2.0000 mg | Freq: Once | INTRAMUSCULAR | Status: AC
  Administered 2021-05-01: 2 mg
  Filled 2021-05-01: qty 2

## 2021-05-01 MED ORDER — CIPROFLOXACIN HCL 500 MG PO TABS: 500.0000 mg | ORAL_TABLET | Freq: Two times a day (BID) | ORAL | 0 refills | Status: DC

## 2021-05-01 MED ORDER — CIPROFLOXACIN HCL 500 MG PO TABS
500.0000 mg | ORAL_TABLET | Freq: Two times a day (BID) | ORAL | Status: DC
  Administered 2021-05-01: 500 mg via ORAL
  Filled 2021-05-01: qty 1

## 2021-05-01 MED ORDER — MAGIC MOUTHWASH
15.0000 mL | Freq: Four times a day (QID) | ORAL | 0 refills | Status: DC | PRN
Start: 2021-05-01 — End: 2021-05-15

## 2021-05-01 MED ORDER — AMLODIPINE BESYLATE 10 MG PO TABS: 10.0000 mg | ORAL_TABLET | Freq: Every evening | ORAL | Status: DC

## 2021-05-01 MED ORDER — FUROSEMIDE 40 MG PO TABS: 40.0000 mg | ORAL_TABLET | Freq: Once | ORAL | Status: DC

## 2021-05-01 MED ORDER — FUROSEMIDE 40 MG PO TABS
40.0000 mg | ORAL_TABLET | Freq: Every day | ORAL | Status: DC
  Administered 2021-05-01 – 2021-05-12 (×12): 40 mg via ORAL
  Filled 2021-05-01 (×13): qty 1

## 2021-05-01 MED ORDER — CIPROFLOXACIN HCL 250 MG PO TABS
500.0000 mg | ORAL_TABLET | Freq: Two times a day (BID) | ORAL | Status: AC
  Administered 2021-05-01 – 2021-05-06 (×10): 500 mg via ORAL
  Filled 2021-05-01 (×10): qty 2

## 2021-05-01 MED ORDER — PANTOPRAZOLE SODIUM 40 MG PO TBEC
40.0000 mg | DELAYED_RELEASE_TABLET | Freq: Every day | ORAL | Status: DC
  Administered 2021-05-02 – 2021-05-15 (×14): 40 mg via ORAL
  Filled 2021-05-01 (×14): qty 1

## 2021-05-01 MED ORDER — LIP MEDEX EX OINT
1.0000 "application " | TOPICAL_OINTMENT | Freq: Two times a day (BID) | CUTANEOUS | Status: DC
  Administered 2021-05-04 – 2021-05-15 (×12): 1 via TOPICAL
  Filled 2021-05-01: qty 7

## 2021-05-01 MED ORDER — METHOCARBAMOL 500 MG PO TABS
1000.0000 mg | ORAL_TABLET | Freq: Four times a day (QID) | ORAL | Status: DC | PRN
  Administered 2021-05-03 – 2021-05-12 (×9): 1000 mg via ORAL
  Filled 2021-05-01 (×9): qty 2

## 2021-05-01 MED ORDER — METRONIDAZOLE 500 MG PO TABS
500.0000 mg | ORAL_TABLET | Freq: Two times a day (BID) | ORAL | Status: AC
  Administered 2021-05-01 – 2021-05-06 (×10): 500 mg via ORAL
  Filled 2021-05-01 (×10): qty 1

## 2021-05-01 MED ORDER — ACETAMINOPHEN 325 MG PO TABS: 325.0000 mg | ORAL_TABLET | ORAL | Status: DC | PRN

## 2021-05-01 MED ORDER — POTASSIUM CHLORIDE 20 MEQ PO PACK
40.0000 meq | PACK | Freq: Once | ORAL | Status: AC
  Administered 2021-05-01: 40 meq via ORAL
  Filled 2021-05-01: qty 2

## 2021-05-01 MED ORDER — DICLOFENAC SODIUM 1 % EX GEL
4.0000 g | Freq: Four times a day (QID) | CUTANEOUS | Status: DC
  Administered 2021-05-04 – 2021-05-11 (×8): 4 g via TOPICAL
  Filled 2021-05-01: qty 100

## 2021-05-01 MED ORDER — METRONIDAZOLE 500 MG PO TABS
500.0000 mg | ORAL_TABLET | Freq: Two times a day (BID) | ORAL | Status: DC
  Administered 2021-05-01: 500 mg via ORAL
  Filled 2021-05-01: qty 1

## 2021-05-01 MED ORDER — LISINOPRIL-HYDROCHLOROTHIAZIDE 20-12.5 MG PO TABS: 2.0000 | ORAL_TABLET | Freq: Every day | ORAL | Status: DC

## 2021-05-01 MED ORDER — METOPROLOL TARTRATE 25 MG PO TABS: 25.0000 mg | ORAL_TABLET | Freq: Two times a day (BID) | ORAL | Status: DC

## 2021-05-01 MED ORDER — METOPROLOL TARTRATE 25 MG PO TABS
12.5000 mg | ORAL_TABLET | Freq: Every day | ORAL | Status: DC
Start: 2021-05-01 — End: 2021-05-01

## 2021-05-01 MED ORDER — FUROSEMIDE 40 MG PO TABS: 40.0000 mg | ORAL_TABLET | Freq: Every day | ORAL | 0 refills | Status: DC | PRN

## 2021-05-01 MED ORDER — ENSURE ENLIVE PO LIQD: 237.0000 mL | Freq: Two times a day (BID) | ORAL | 12 refills | Status: DC

## 2021-05-01 MED ORDER — METOPROLOL TARTRATE 25 MG PO TABS
25.0000 mg | ORAL_TABLET | Freq: Two times a day (BID) | ORAL | Status: DC
  Administered 2021-05-01 – 2021-05-10 (×18): 25 mg via ORAL
  Filled 2021-05-01 (×18): qty 1

## 2021-05-01 NOTE — Progress Notes (Signed)
16 Days Post-Op   Subjective/Chief Complaint: Patient feels well today with no complaints Hopeful that she will be transferred to CIR today  We discussed her scan with GYN - Dr. Elonda Husky yesterday.  He felt that this fluid in the vagina was physiologic and not clinically significant with no intervention recommended.  Her WBC is decreasing since restarting Meropenem  Objective: Vital signs in last 24 hours: Temp:  [98.1 F (36.7 C)-99.2 F (37.3 C)] 98.1 F (36.7 C) (11/25 0558) Pulse Rate:  [105-123] 105 (11/25 0558) Resp:  [18-20] 18 (11/25 0558) BP: (132-140)/(91-97) 140/91 (11/25 0558) SpO2:  [100 %] 100 % (11/25 0558) Last BM Date: 04/30/21  Intake/Output from previous day: 11/24 0701 - 11/25 0700 In: 360 [P.O.:360] Out: 1875 [Urine:1475; Stool:400] Intake/Output this shift: No intake/output data recorded.  General appearance: alert and cooperative GI: soft, non-tender; bowel sounds normal; no masses,  no organomegaly and ostomy patent, vac in place  Lab Results:  Recent Labs    04/29/21 0230 05/01/21 0627  WBC 12.6* 12.1*  HGB 9.8* 10.8*  HCT 31.0* 34.4*  PLT 828* 790*   BMET Recent Labs    04/29/21 0230 05/01/21 0627  NA 139 139  K 3.9 3.2*  CL 103 101  CO2 27 28  GLUCOSE 86 97  BUN 17 20  CREATININE 0.56 0.56  CALCIUM 8.7* 9.0   PT/INR No results for input(s): LABPROT, INR in the last 72 hours. ABG No results for input(s): PHART, HCO3 in the last 72 hours.  Invalid input(s): PCO2, PO2  Studies/Results: CT ABDOMEN PELVIS W CONTRAST  Addendum Date: 04/29/2021   ADDENDUM REPORT: 04/29/2021 16:59 ADDENDUM: After discussing the case with Dr. Dwaine Gale in interventional radiology, it was noted that the fluid in the pelvis originally thought to be in the cul-de-sac is likely within the vagina, best seen on sagittal imaging. Recommend speculum exam. Also, in the left lateral wall in the area of prior abscess in the lateral wall abdominal musculature, some of  the abdomen soft tissue appears to be enhancing. While this could be infectious, cannot exclude tumor seeding in the left lateral abdominal wall, measuring 6.9 x 3.8 cm on image 64 of series 2. Electronically Signed   By: Rolm Baptise M.D.   On: 04/29/2021 16:59   Result Date: 04/29/2021 CLINICAL DATA:  Abdominal abscess/infection suspected. History of diverticulitis repair with colostomy and colon cancer EXAM: CT ABDOMEN AND PELVIS WITH CONTRAST TECHNIQUE: Multidetector CT imaging of the abdomen and pelvis was performed using the standard protocol following bolus administration of intravenous contrast. CONTRAST:  57mL OMNIPAQUE IOHEXOL 350 MG/ML SOLN COMPARISON:  04/10/2021 FINDINGS: Lower chest: Small left pleural effusion. Compressive atelectasis in the left lower lobe. Right base atelectasis. Scattered coronary artery calcifications. Hepatobiliary: Gallstones noted within the gallbladder. No focal hepatic abnormality. Pancreas: No focal abnormality or ductal dilatation. Spleen: No focal abnormality.  Normal size. Adrenals/Urinary Tract: Left adrenal mass again noted, measuring 3 cm, unchanged. Right adrenal gland and kidneys unremarkable. Urinary bladder decompressed, grossly unremarkable. Stomach/Bowel: Left lower quadrant ostomy with Hartmann's pouch. No evidence of bowel obstruction. Vascular/Lymphatic: No evidence of aneurysm or adenopathy. Reproductive: Central fibroid with rim calcifications measures up to 4.7 cm. No adnexal mass. Other: Interval removal of previously seen left lower quadrant pigtail drainage catheter. Overall, left lower quadrant fluid collection has improved, but continued fluid collection in the left retroperitoneum, left psoas muscle, and left lateral abdominal wall musculature compatible with residual abscesses. New fluid collection in the cul-de-sac and wrapping  around the uterus concerning for abscess. Musculoskeletal: No acute bony abnormality. IMPRESSION: Continued left  retroperitoneal abscesses inferior to the left kidney, involving the left psoas muscle and left abdominal wall musculature. Overall size is decreased since prior study. Interval removal of left lower quadrant abscess drainage catheter. New fluid collection in the cul-de-sac and wrapping around the uterus concerning for abscess. Cholelithiasis. Small left pleural effusion.  Bibasilar atelectasis. Stable left adrenal adenoma. Electronically Signed: By: Rolm Baptise M.D. On: 04/29/2021 14:29    Anti-infectives: Anti-infectives (From admission, onward)    Start     Dose/Rate Route Frequency Ordered Stop   05/01/21 1000  metroNIDAZOLE (FLAGYL) tablet 500 mg        500 mg Oral Every 12 hours 05/01/21 0756     05/01/21 0845  ciprofloxacin (CIPRO) tablet 500 mg        500 mg Oral 2 times daily 05/01/21 0756     04/29/21 1545  meropenem (MERREM) 1 g in sodium chloride 0.9 % 100 mL IVPB  Status:  Discontinued        1 g 200 mL/hr over 30 Minutes Intravenous Every 8 hours 04/29/21 1453 05/01/21 0756   04/17/21 1700  ceFEPIme (MAXIPIME) 2 g in sodium chloride 0.9 % 100 mL IVPB  Status:  Discontinued        2 g 200 mL/hr over 30 Minutes Intravenous Every 8 hours 04/17/21 1530 04/21/21 0808   04/17/21 1700  metroNIDAZOLE (FLAGYL) IVPB 500 mg  Status:  Discontinued        500 mg 100 mL/hr over 60 Minutes Intravenous Every 12 hours 04/17/21 1530 04/21/21 0808   04/15/21 1000  anidulafungin (ERAXIS) 100 mg in sodium chloride 0.9 % 100 mL IVPB  Status:  Discontinued        100 mg 78 mL/hr over 100 Minutes Intravenous Every 24 hours 04/14/21 0744 04/21/21 0808   04/15/21 0900  clindamycin (CLEOCIN) IVPB 900 mg       See Hyperspace for full Linked Orders Report.   900 mg 100 mL/hr over 30 Minutes Intravenous 60 min pre-op 04/14/21 1405 04/15/21 1142   04/15/21 0900  gentamicin (GARAMYCIN) 340 mg in dextrose 5 % 100 mL IVPB       See Hyperspace for full Linked Orders Report.   5 mg/kg  67.7 kg  (Adjusted) 108.5 mL/hr over 60 Minutes Intravenous 60 min pre-op 04/14/21 1405 04/15/21 1834   04/14/21 1000  anidulafungin (ERAXIS) 200 mg in sodium chloride 0.9 % 200 mL IVPB        200 mg 78 mL/hr over 200 Minutes Intravenous  Once 04/14/21 0755 04/14/21 1500   04/13/21 0900  meropenem (MERREM) 1 g in sodium chloride 0.9 % 100 mL IVPB  Status:  Discontinued        1 g 200 mL/hr over 30 Minutes Intravenous Every 8 hours 04/13/21 0800 04/17/21 1530   04/10/21 2200  cefTRIAXone (ROCEPHIN) 2 g in sodium chloride 0.9 % 100 mL IVPB  Status:  Discontinued        2 g 200 mL/hr over 30 Minutes Intravenous Daily at bedtime 04/10/21 2044 04/13/21 0746   04/10/21 2200  metroNIDAZOLE (FLAGYL) IVPB 500 mg  Status:  Discontinued        500 mg 100 mL/hr over 60 Minutes Intravenous Every 12 hours 04/10/21 2044 04/13/21 0745       Assessment/Plan: S/P LAPAROSCOPIC ASSISTED HARTMANN  (LEFT COLECTOMY / END COLOSTOMY), SMALL BOWEL RESECTION, LEFT RETROPERITONEAL / FLANK ABSCESS  DRAINAGE 04/15/21 Dr. [name] - path: Colonic adenocarcinoma. Oncology has seen - wound vac MWF - d/c JP drain 04/28/21 - tolerating diet and colostomy functioning - WOC following, will need outpatient follow up with ostomy clinic - WBC elevated yesterday             - CT scan showed some smaller residual fluid collections, but new collection in pelvis - (cul-de-sac vs. In the vagina).  No intervention recommended after discussion with GYN - Stable for discharge to CIR when bed available   Anemia - hgb stable today at 9.6, last transfusion was on 11/19. Transfuse PRN per primary   ID - Change Meropenem to Cipro/ Flagyl for 5 days; monitor WBC FEN - carb mod diet VTE - SCDs/ per primary, ok for chemical dvt prophylaxis from surgical standpoint   If patient is transferred to CIR today, the general surgery team at [facility] can consult on the patient as needed.  Otherwise, follow-up with Dr. Johney Maine in 2-3 weeks.  HTN Morbid  obesity BMI 39.99  LOS: 21 days    Maia Petties 05/01/2021

## 2021-05-01 NOTE — Progress Notes (Signed)
PMR Admission Coordinator Pre-Admission Assessment  Patient: Mikayla Rios is an 35 y.o., female MRN: [id number] DOB: 03/22/1986 Height: 5' 3" (160 cm) Weight: 99.5 kg  Insurance Information HMO:     PPO:   yes   PCP:      IPA:      80/20:      OTHER:  PRIMARY: BCBS      Policy#: [id number]      Subscriber: Pt CM Name:  Mikayla Rios      Phone#: [phone]     Fax#: [phone]  I received a call on 04/28/21 from Mikayla Rios with [employer] approving 14 days for admission 04/29/21 with review due 05/12/21      Pre-Cert#: [id number]       Employer:  Benefits:  Phone #: [phone]     Name:  Mikayla Rios: 09/05/2020- 04/06/2022 Deductible: $500 ($500 met) OOP Max: $1,500 ($1,500 met) CIR: 80% coverage, 20% co-insurance SNF: 80% coverage, 20% co-insurance; with a limit of 60 visits/cal year (60 remaining) Outpatient:  $20 co-pay; combined 30 visit limit combined/cal yr (30 remaining) Home Health: 80% coverage, 20% co-insurance DME: 80% coverage, 20% co-insurance Providers: in network  SECONDARY:       Policy#:      Phone#:   Financial Counselor:       Phone#:   The "Data Collection Information Summary" for patients in Inpatient Rehabilitation Facilities with attached "Privacy Act Statement-Health Care Records" was provided and verbally reviewed with: Patient  Emergency Contact Information Contact Information     Name Relation Home Work Mobile   Hall,Errol Spouse [phone]  [phone]   Edrington,DOROTHY Mother   [phone]       Current Medical History  Patient Admitting Diagnosis: Diverticulitis with abcess History of Present Illness: Mikayla Rios is a 35-year-old right-handed female with history of hypertension, obesity with BMI 39.99, inappropriate sinus tachycardia followed by Dr. Schumann.  Per chart review patient lives with spouse.  Two-level home one half bath on main level one-step to entry.  Independent prior to admission.  Patient recently hospitalized [facility]  5/31 until 6/6 for abdominal pain with initial work-up showing perforated diverticulitis.  She was initially admitted to the surgical service treated with intravenous antibiotics and bowel rest.  Patient had worsening leukocytosis repeat CT showed diverticular abscess amenable to drainage and IR consulted and underwent CT-guided drain placement 11/07/2020.  She was discharged to home ambulating extended distances.  Presented 04/10/2021 with progressive generalized weakness and bouts of lightheadedness.  She has had decrease in appetite and gradual weight loss.  She had been scheduled to see gastroenterology 10/6 with eventual plan to undergo endoscopic work-up to rule out any malignancy.  Upon evaluation in the emergency department underwent CT imaging of the chest abdomen pelvis.  CT angiogram of the chest showed no evidence of pulmonary emboli however CT imaging of abdomen and pelvis did reveal new enhancing fluid collection posterior to segment of inflamed colon measuring 8.5 x 3.5 x 10 cm invading the adjacent iliopsoas muscle as well extending through the left lateral abdominal wall.  Posterior drain was noted to be in middle of this abscess.  The anterior drain appeared to be completely dislodged into the subcutaneous tissue.  This did show dramatic change compared to patient's recent CT imaging of the abdomen pelvis 10/28.  Patient also found to have multiple SIRS criteria with concern for early sepsis and therefore was given 1 L bolus of lactated Ringer.  She was hypokalemic with intravenous potassium   chloride initiated.  General surgery as well as interventional radiology and gastroenterology consulted patient did undergo drainage tube exchange 04/13/2021.  She underwent laparoscopic-assisted Hartman left colectomy/end colostomy mobilization of splenic flexure of colon and small bowel resection left retroperitoneal/flank abscess drainage for chronic perforated colon with retroperitoneal abscess partial small  bowel obstruction and colon obstruction 04/15/2021 per Dr. Gross and pathology obtained diagnosed pT4NO stage II sigmoid colon cancer.patient did require vasopressor therapy postoperatively and weaned off pressors.  Leukocytosis 12,600 follow-up CT of abdomen 04/29/2021 showed continued left retroperitoneal abscess inferior to the left kidney overall size decreased since prior study.  New fluid collection in the cul-de-sac and wrapping around the uterus concerning for abscess reviewed by gynecology and did not feel clinically significant amount of fluid that would need intervention and patient currently is maintained on empiric meropenem changed to Cipro and Flagyl 1125 2022 x 5 days..  Wound care ongoing with colostomy in place as well as right JP drain and wound VAC.  Required mechanical ventilation postoperatively for acute respiratory failure extubated 11/10.  Bouts of prolonged QTC from hypokalemia and hypomagnesemia with supplement added.  Repeat EKG showed improvement in the QTC.  Echocardiogram with ejection fraction of 60 to 65% no wall motion abnormalities.  Dr Yan Feng of medical oncology consulted.  Plan is for adjuvant chemotherapy FOLFOX or Capox for 3 to 6 months once recovered from latest surgery and rehabilitation.  Hospital course acute blood loss anemia transfused 2 units packed red blood cells FFP 04/19/2021 and latest hemoglobin 10.8.  Anasarca related to hypoalbuminemia and aggressive volume repletion she did receive diuresis.  She did receive TPN for nutritional support.  She is now tolerating a regular consistency diet.  Therapy evaluations completed due to patient's decreased functional mobility was admitted for a comprehensive rehab program.  Patient's medical record from [facility] has been reviewed by the rehabilitation admission coordinator and physician.  Past Medical History  Past Medical History:  Diagnosis Rios   Allergy    seasonal   Anemia    Blood infection 1985    Blood transfusion without reported diagnosis    Diverticulitis    Hypertension    Obesity    Sleep apnea     Has the patient had major surgery during 100 days prior to admission? Yes  Family History   family history includes Autism in her son; Cancer in her mother, paternal grandfather, and paternal uncle; Colon cancer in her mother; Congestive Heart Failure in her father; Diabetes in her maternal grandfather, maternal grandmother, mother, and paternal grandmother; Heart disease in her paternal aunt; Hypertension in her father, maternal grandmother, and mother; Kidney disease in her maternal grandfather.  Current Medications  Current Facility-Administered Medications:    0.9 %  sodium chloride infusion (Manually program via Guardrails IV Fluids), , Intravenous, Once, Memon, Jehanzeb, MD   0.9 %  sodium chloride infusion, , Intravenous, PRN, Memon, Jehanzeb, MD, Last Rate: 150 mL/hr at 04/19/21 0700, Infusion Verify at 04/19/21 0700   albumin human 25 % solution 25 g, 25 g, Intravenous, Q6H, Memon, Jehanzeb, MD   anidulafungin (ERAXIS) 100 mg in sodium chloride 0.9 % 100 mL IVPB, 100 mg, Intravenous, Q24H, Simaan, Elizabeth S, PA-C, Stopped at 04/18/21 1205   ceFEPIme (MAXIPIME) 2 g in sodium chloride 0.9 % 100 mL IVPB, 2 g, Intravenous, Q8H, Simaan, Elizabeth S, PA-C, Last Rate: 200 mL/hr at 04/19/21 0846, 2 g at 04/19/21 0846   chlorhexidine gluconate (MEDLINE KIT) (PERIDEX) 0.12 % solution   15 mL, 15 mL, Mouth Rinse, BID, Memon, Jehanzeb, MD, 15 mL at 04/19/21 0834   Chlorhexidine Gluconate Cloth 2 % PADS 6 each, 6 each, Topical, Daily, Simaan, Elizabeth S, PA-C, 6 each at 04/18/21 2231   furosemide (LASIX) injection 20 mg, 20 mg, Intravenous, BID, Memon, Jehanzeb, MD   hydrALAZINE (APRESOLINE) injection 10 mg, 10 mg, Intravenous, Q6H PRN, Memon, Jehanzeb, MD, 10 mg at 04/18/21 1644   HYDROmorphone (DILAUDID) injection 0.5-1 mg, 0.5-1 mg, Intravenous, Q4H PRN, Clark, Jalise Zawistowski P, DO, 1 mg at  04/19/21 0848   insulin aspart (novoLOG) injection 0-9 Units, 0-9 Units, Subcutaneous, Q6H, Bell, Michelle T, RPH, 1 Units at 04/18/21 1210   labetalol (NORMODYNE) injection 10-20 mg, 10-20 mg, Intravenous, Q2H PRN, Ogan, Okoronkwo U, MD, 20 mg at 04/19/21 0834   lip balm (CARMEX) ointment 1 application, 1 application, Topical, BID, Simaan, Elizabeth S, PA-C, 1 application at 04/18/21 2230   magic mouthwash, 15 mL, Oral, QID PRN, Simaan, Elizabeth S, PA-C   methocarbamol (ROBAXIN) 1,000 mg in dextrose 5 % 100 mL IVPB, 1,000 mg, Intravenous, Q6H PRN, Simaan, Elizabeth S, PA-C   metoprolol tartrate (LOPRESSOR) injection 5 mg, 5 mg, Intravenous, Q6H, Memon, Jehanzeb, MD, 5 mg at 04/19/21 0527   metroNIDAZOLE (FLAGYL) IVPB 500 mg, 500 mg, Intravenous, Q12H, Simaan, Elizabeth S, PA-C, Stopped at 04/19/21 0625   mupirocin ointment (BACTROBAN) 2 % 1 application, 1 application, Nasal, BID, Simaan, Elizabeth S, PA-C, 1 application at 04/18/21 2230   pantoprazole (PROTONIX) injection 40 mg, 40 mg, Intravenous, Daily, Simaan, Elizabeth S, PA-C, 40 mg at 04/18/21 1021   phenol (CHLORASEPTIC) mouth spray 2 spray, 2 spray, Mouth/Throat, PRN, Simaan, Elizabeth S, PA-C   prochlorperazine (COMPAZINE) injection 5-10 mg, 5-10 mg, Intravenous, Q4H PRN, Simaan, Elizabeth S, PA-C   sodium chloride flush (NS) 0.9 % injection 10-40 mL, 10-40 mL, Intracatheter, PRN, Simaan, Elizabeth S, PA-C, 10 mL at 04/18/21 2230   TPN ADULT (ION), , Intravenous, Continuous TPN, Shade, Christine E, RPH, Last Rate: 90 mL/hr at 04/19/21 0700, Infusion Verify at 04/19/21 0700  Patients Current Diet:  Diet Order             Diet NPO time specified Except for: Ice Chips  Diet effective now                   Precautions / Restrictions Precautions Precautions: Fall Precaution Comments: monitor HR, colostomy, drains, VAC Restrictions Weight Bearing Restrictions: No   Has the patient had 2 or more falls or a fall with injury in  the past year? No  Prior Activity Level  Pt. Went out for appointments only in the 4 months PTA  Prior Functional Level Self Care: Did the patient need help bathing, dressing, using the toilet or eating? Needed some help  Indoor Mobility: Did the patient need assistance with walking from room to room (with or without device)? Needed some help  Stairs: Did the patient need assistance with internal or external stairs (with or without device)? Needed some help  Functional Cognition: Did the patient need help planning regular tasks such as shopping or remembering to take medications? Independent  Patient Information Are you of Hispanic, Latino/a,or Spanish origin?: A. No, not of Hispanic, Latino/a, or Spanish origin What is your race?: B. Black or African American Do you need or want an interpreter to communicate with a doctor or health care staff?: 0. No  Patient's Response To:  Health Literacy and Transportation Is the patient able to respond   to health literacy and transportation needs?: Yes Health Literacy - How often do you need to have someone help you when you read instructions, pamphlets, or other written material from your doctor or pharmacy?: Never In the past 12 months, has lack of transportation kept you from medical appointments or from getting medications?: No In the past 12 months, has lack of transportation kept you from meetings, work, or from getting things needed for daily living?: No  Home Assistive Devices / Equipment Home Assistive Devices/Equipment: CPAP (has cpap but doesn't use it) Home Equipment: None  Prior Device Use: Indicate devices/aids used by the patient prior to current illness, exacerbation or injury? None of the above  Current Functional Level Cognition  Overall Cognitive Status: Within Functional Limits for tasks assessed Orientation Level: Oriented X4 General Comments: pt pleasant, follows commands appropriately, lethargy suspect to morphine     Extremity Assessment (includes Sensation/Coordination)  Upper Extremity Assessment: Defer to OT evaluation  Lower Extremity Assessment: Generalized weakness (symmetrical)    ADLs  Overall ADL's : Needs assistance/impaired Eating/Feeding: Set up, Sitting Grooming: Set up, Sitting Upper Body Bathing: Set up, Sitting Lower Body Bathing: Maximal assistance, Sitting/lateral leans Upper Body Dressing : Set up, Sitting Lower Body Dressing: Total assistance, Sit to/from stand Lower Body Dressing Details (indicate cue type and reason): reliant on walker to stand neding total assistance for all clothing. Toilet Transfer: Min guard, BSC, Squat-pivot, Rolling walker (2 wheels) Toileting- Clothing Manipulation and Hygiene: Total assistance, Sit to/from stand Toileting - Clothing Manipulation Details (indicate cue type and reason): patient reliant on UEs on walker - needs assistance for hygiene and clothing management. Functional mobility during ADLs: Min guard, Rolling walker (2 wheels) General ADL Comments: min guard for sit to stand and only able to take steps to reclner    Mobility  Overal bed mobility: Needs Assistance Bed Mobility: Sit to Supine Supine to sit: Min assist, HOB elevated Sit to supine: Mod assist, +2 for physical assistance General bed mobility comments: deferred    Transfers  Overall transfer level: Needs assistance Equipment used: Rolling walker (2 wheels) Transfers: Sit to/from Stand, Stand Pivot Transfers Sit to Stand: Mod assist, +2 physical assistance Stand pivot transfers: Min guard, +2 safety/equipment General transfer comment: deferred    Ambulation / Gait / Stairs / Wheelchair Mobility  Ambulation/Gait General Gait Details: HR up to 171 with stand pivot transfer and 2-3 steps at bedside- will hold until more appropriate    Posture / Balance Dynamic Sitting Balance Sitting balance - Comments: seated EOB Balance Overall balance assessment: Needs  assistance Sitting-balance support: No upper extremity supported Sitting balance-Leahy Scale: Fair Sitting balance - Comments: seated EOB Standing balance support: Bilateral upper extremity supported, Reliant on assistive device for balance, During functional activity Standing balance-Leahy Scale: Poor    Special needs/care consideration Skin surgical incision, cracking on bilateral feet, ecchymosis on BUEs, Excoriation on L chest.  and Special service needs wound  vac   Previous Home Environment (from acute therapy documentation) Living Arrangements: Spouse/significant other, Children  Lives With: Spouse Available Help at Discharge: Family, Available PRN/intermittently Type of Home:  (townhome) Home Layout: Two level, 1/2 bath on main level Alternate Level Stairs-Number of Steps: 14 Home Access: Stairs to enter Entrance Stairs-Number of Steps: 1 Bathroom Shower/Tub: Tub/shower unit Bathroom Toilet: Standard Bathroom Accessibility: No Home Care Services: No  Discharge Living Setting Plans for Discharge Living Setting: Patient's home Type of Home at Discharge: House Discharge Home Layout: Two level, 1/2 bath on main   level Alternate Level Stairs-Rails: Right Alternate Level Stairs-Number of Steps: 14 Discharge Home Access: Stairs to enter Entrance Stairs-Rails: None Entrance Stairs-Number of Steps: 1 Discharge Bathroom Shower/Tub: Tub/shower unit Discharge Bathroom Toilet: Standard Discharge Bathroom Accessibility: No Does the patient have any problems obtaining your medications?: No  Social/Family/Support Systems Patient Roles: Spouse Contact Information: [phone] Anticipated Caregiver: Errol Hall Anticipated Caregiver's Contact Information: [phone] Ability/Limitations of Caregiver: Can provide Min A Caregiver Availability: 24/7 Discharge Plan Discussed with Primary Caregiver: Yes Is Caregiver In Agreement with Plan?: Yes Does Caregiver/Family have Issues with  Lodging/Transportation while Pt is in Rehab?: Yes  Goals Patient/Family Goal for Rehab: PT/OT Min A Expected length of stay: 7-10 days Pt/Family Agrees to Admission and willing to participate: Yes Program Orientation Provided & Reviewed with Pt/Caregiver Including Roles  & Responsibilities: Yes  Decrease burden of Care through IP rehab admission: Specialzed equipment needs, Decrease number of caregivers, Bowel and bladder program, and Patient/family education  Possible need for SNF placement upon discharge: not anticipated  Patient Condition: I have reviewed medical record from [facility], spoken with CM, and patient and spouse. I met with patient at the bedside for inpatient rehabilitation assessment.  Patient will benefit from ongoing PT and OT, can actively participate in 3 hours of therapy a day 5 days of the week, and can make measurable gains during the admission.  Patient will also benefit from the coordinated team approach during an Inpatient Acute Rehabilitation admission.  The patient will receive intensive therapy as well as Rehabilitation physician, nursing, social worker, and care management interventions.  Due to safety, skin/wound care, disease management, medication administration, pain management, and patient education the patient requires 24 hour a day rehabilitation nursing.  The patient is currently min-mod A with mobility and basic ADLs.  Discharge setting and therapy post discharge at home with home health is anticipated.  Patient has agreed to participate in the Acute Inpatient Rehabilitation Program and will admit today.  Preadmission Screen Completed By:  Aimar Borghi B Rayyan Orsborn, 04/19/2021 9:52 AM ______________________________________________________________________   Discussed status with Dr. Kirsteins on 05/01/21  at 1000 and received approval for admission today.  Admission Coordinator:  Keddrick Wyne B Salil Raineri, CCC-SLP, time 1100/Rios 05/01/21    Assessment/Plan: Diagnosis:Debility related to colon carcinoma Does the need for close, 24 hr/day Medical supervision in concert with the patient's rehab needs make it unreasonable for this patient to be served in a less intensive setting? Yes Co-Morbidities requiring supervision/potential complications: MOrbid obesity, chronic sinus tachycardia, Bowel perforation, s/p colostomy, abd infection s/p wound vac Due to bladder management, safety, skin/wound care, disease management, medication administration, pain management, patient education, and colostomy care , does the patient require 24 hr/day rehab nursing? Yes Does the patient require coordinated care of a physician, rehab nurse, PT, OT, and SLP to address physical and functional deficits in the context of the above medical diagnosis(es)? Yes Addressing deficits in the following areas: balance, endurance, locomotion, strength, transferring, bowel/bladder control, bathing, dressing, feeding, toileting, and psychosocial support Can the patient actively participate in an intensive therapy program of at least 3 hrs of therapy 5 days a week? Yes The potential for patient to make measurable gains while on inpatient rehab is excellent Anticipated functional outcomes upon discharge from inpatient rehab: modified independent PT, modified independent OT, modified independent SLP Estimated rehab length of stay to reach the above functional goals is: 7-10d Anticipated discharge destination: Home 10. Overall Rehab/Functional Prognosis: excellent   MD Signature: Andrew E. Kirsteins M.D. [facility] Fellow Am Acad of Phys Med and Rehab Diplomate Am Board of Electrodiagnostic Med Fellow Am Board of Interventional Pain  

## 2021-05-01 NOTE — Consult Note (Signed)
[facility] Nurse Consult Note: Patient is known to the [facility] team from her inpatient status at [facility] hospital. Just discharged today to Rehab. Reason for Consult: wound and ostomy care/teaching. Wound type: right abdomen surgical wound receiving VAC therapy. I turned this procedure over to the bedside nurse Wednesday 72/90 due to the simplicity of the dressing change. I have entered orders for the bedside nursing staff to perform this process while in Rehab. And, to perform today if not done prior to her arrival while she was at [facility].  Also, I have entered ostomy supply and care instructions. The patient and her spouse have received education.   The [facility] team will see weekly while in rehab beginning the week of 11/28. Val Riles, RN, MSN, CWOCN, CNS-BC, pager [phone]

## 2021-05-01 NOTE — Progress Notes (Signed)
Pt admitted to rehab A&Ox4. Accompanied by husband. Aware of policies & procedures and oriented to floor. Removed purewick & removed Picc per Linna Hoff.   Mikayla Rios [name]

## 2021-05-01 NOTE — Progress Notes (Signed)
Inpatient Rehabilitation Admission Medication Review by a Pharmacist  A complete drug regimen review was completed for this patient to identify any potential clinically significant medication issues.  High Risk Drug Classes Is patient taking? Indication by Medication  Antipsychotic No   Anticoagulant No   Antibiotic Yes Flagyl/Cipro- Retroperitoneal abscess   Opioid Yes OxyIR- pain   Antiplatelet No   Hypoglycemics/insulin No   Vasoactive Medication Yes Lasix- small left pleural effusion  lopressor- HTN   Chemotherapy No   Other Yes Voltaren gel, robaxin- pain      Type of Medication Issue Identified Description of Issue Recommendation(s)  Drug Interaction(s) (clinically significant)     Duplicate Therapy     Allergy     No Medication Administration End Date     Incorrect Dose     Additional Drug Therapy Needed     Significant med changes from prior encounter (inform family/care partners about these prior to discharge).    Other  PTA meds: Norvasc,Toprol XL (changed to Lopressor), Zestoretic, Bentyl, Prenatal Vitamins, Gas-X  Restart home meds when clinically indicated     Clinically significant medication issues were identified that warrant physician communication and completion of prescribed/recommended actions by midnight of the next day:  No  Time spent performing this drug regimen review (minutes):  30   Mikayla Rios BS, PharmD, BCPS Clinical Pharmacist 05/01/2021 12:56 PM

## 2021-05-01 NOTE — Progress Notes (Signed)
Right UA PICC d/c'd per policy.  Patient tolerated well.  Vaseline gauze and folded 4x4 gauze pressure dressing applied with manual pressure held x 5 minutes.  No bleeding post removal.  Patient and bedside nurse instructed to leave dressing on 24-48 hours, patient to notify staff if bleeding noted, and not to get dressing wet.

## 2021-05-01 NOTE — Discharge Summary (Addendum)
Discharge Summary  Mikayla Rios JKD:[id number] DOB: [birth year]  PCP: [name], MD  Admit date: 04/10/2021 Discharge date: 05/01/2021  Time spent: 43mins, more than 50% time spent on coordination of care.  D/c to CIR  Recommendations for Outpatient Follow-up:  F/u with PCP within a week  for hospital discharge follow up, repeat cbc/bmp at follow up F/u with general surgery oncology   Discharge Diagnoses:  Active Hospital Problems   Diagnosis Date Noted   Diverticulitis of large intestine with abscess 04/10/2021   Malignant neoplasm of sigmoid colon ([place])    Essential hypertension 04/10/2021   SIRS (systemic inflammatory response syndrome) (HCC) 04/10/2021   Hypokalemia, inadequate intake 04/10/2021   Protein-calorie malnutrition, severe ([place]) 04/10/2021   Reactive thrombocytosis 04/10/2021   Prolonged QT interval 04/10/2021   Inappropriate sinus tachycardia 07/11/2019   Anemia 07/11/2019   Obstructive sleep apnea 11/27/2018   Class 3 severe obesity due to excess calories with serious comorbidity and body mass index (BMI) of 60.0 to 69.9 in adult [facility])     Resolved Hospital Problems  No resolved problems to display.    Discharge Condition: stable  Diet recommendation: heart healthy/carb modified  Filed Weights   04/15/21 1800 04/23/21 1752 04/24/21 0700  Weight: 99.5 kg 100.5 kg 102.4 kg    History of present illness: ( per admitting MD Dr Cyd Silence) 35 year old female with past medical history of hypertension, inappropriate sinus tachycardia, obesity, obstructive sleep apnea and diverticulosis with complicated diverticulitis and abscess formation 11/2020 status post IR guided placement of 2 left lower quadrant drains who presents to [facility] emergency department with complaints of generalized weakness and lightheadedness.  Upon evaluation in the emergency department patient underwent CT imaging of the chest abdomen and pelvis.  CT angiogram of the  chest revealed no evidence of pulmonary embolism however CT imaging of the abdomen and pelvis did reveal new enhancing fluid collection posterior to a segment of inflamed colon measuring 8.5 x 3.5 x 10.0 cm invading the adjacent iliopsoas muscle as well as extending through the left lateral abdominal wall.  Posterior drain is noted to be in the middle of this abscess.  The anterior drain appears to be completely dislodged into the subcutaneous tissues.  This is a dramatic change compared to the patient's recent CT imaging of the abdomen and pelvis on 10/28.  Patient was also found to have multiple SIRS criteria with concerns for early sepsis and therefore patient was given a 1 L bolus of lactated Ringer solution.  Patient is also found to be hypokalemic at 2.7 and was given 30 millequivalents of intravenous potassium chloride.  ER provider discussed case with Dr. Harlow Asa with general surgery who recommended medicine admission, antibiotics and stated that he would evaluate the patient in consultation the morning of 11/5.  The hospitalist group is now been called to assess the patient for admission to the hospital.  Hospital Course:  Principal Problem:   Diverticulitis of large intestine with abscess Active Problems:   Class 3 severe obesity due to excess calories with serious comorbidity and body mass index (BMI) of 60.0 to 69.9 in adult [facility])   Obstructive sleep apnea   Anemia   Inappropriate sinus tachycardia   Essential hypertension   SIRS (systemic inflammatory response syndrome) (HCC)   Hypokalemia, inadequate intake   Protein-calorie malnutrition, severe (HCC)   Reactive thrombocytosis   Prolonged QT interval   Malignant neoplasm of sigmoid colon (HCC)   CHRONIC PERFORATED COLON WITH RETROPERITONEAL ABSCESS PARTIAL SMALL  BOWEL OBSTRUCTION PARTIAL COLON OBSTRUCTION H/o Complicated diverticulitis with abscess formation and drain placement in June 2022 S/p LAPAROSCOPIC ASSISTED HARTMANN   (LEFT COLECTOMY / END COLOSTOMY)  -wound vac -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 -Was on imipenem, general surgery recommended change to Cipro and Flagyl for 5 more days , general surgery clears patient to go to CIR .  General surgery can be consulted once patient go to CIR at home , detail recommendation from general surgery please refer to original notes  Discontinue PICC line    Septic shock  Secondary to retroperitoneal abscess -Patient required vasopressor therapy postoperatively, but has been able to wean off pressors   Acute respiratory failure with hypoxia requiring mechanical ventilation -Patient was intubated for operative management and due to hemodynamic instability postoperatively, decision was made to leave the patient intubated and monitored in the ICU -She was able to extubate on 11/10 and respiratory status has been stable   Blood loss anemia, post op Required prbc transfusion x10units, received iv lasix for volume overload, no edema on exam, CT ab on 11/13 showed small left pleural effusion, might be approaching to euvolemic,  - discharge on oral lasix daily prn for edema,    Tachycardia/palpitations:   -seen by cardiology s/p Zio patch x3 days on 11/12/2019 showed no significant arrhythmias Tsh unremarkable Possible reactive, continue betablocker, titrate up if needed    HTN, hold home medication norvasc, lisinopril/HCTZ, leave room  to increase metoprolol for heart rate control   Hyperglycemia on from 11/9 to 11/15 A1c 4.6 No prior diagnosis of diabetes Hyperglycemia likely due to stress Has improved Continue carb modified diet, sliding scale insulin   Nutritional Assessment:  The patient's BMI is: Body mass index is 39.99 kg/m.Marland Kitchen  Seen by dietician.  I agree with the assessment and plan as outlined below:   Nutrition Status: Nutrition Problem: Inadequate oral intake Etiology: acute illness Signs/Symptoms: per patient/family report Interventions: Ensure  Enlive (each supplement provides 350kcal and 20 grams of protein)   .Consultants:  GI General surgery Oncology cir   Procedures:  As above    Discharge Exam: BP (!) 140/91 (BP Location: Left Arm)   Pulse (!) 105   Temp 98.1 F (36.7 C) (Oral)   Resp 18   Ht 5\' 3"  (1.6 m)   Wt 102.4 kg   LMP 04/10/2021 (Approximate)   SpO2 100%   BMI 39.99 kg/m   General: NAD, pleasant Cardiovascular: Slight sinus tachycardia, no pedal edema Respiratory: Normal respiratory effort  Discharge Instructions You were cared for by a hospitalist during your hospital stay. If you have any questions about your discharge medications or the care you received while you were in the hospital after you are discharged, you can call the unit and asked to speak with the hospitalist on call if the hospitalist that took care of you is not available. Once you are discharged, your primary care physician will handle any further medical issues. Please note that NO REFILLS for any discharge medications will be authorized once you are discharged, as it is imperative that you return to your primary care physician (or establish a relationship with a primary care physician if you do not have one) for your aftercare needs so that they can reassess your need for medications and monitor your lab values.  Discharge Instructions     Diet - low sodium heart healthy   Complete by: As directed    Carb modified diet   Discharge wound care:   Complete by:  As directed    For general surgery instruction   Increase activity slowly   Complete by: As directed       Allergies as of 05/01/2021       Reactions   Chlorhexidine Gluconate Hives, Itching   CHG wipes cause itching and hives requiring benadryl   Shellfish Allergy Hives   Penicillins Hives, Rash   Has patient had a PCN reaction causing immediate rash, facial/tongue/throat swelling, SOB or lightheadedness with hypotension: No Has patient had a PCN reaction causing  severe rash involving mucus membranes or skin necrosis: hives Has patient had a PCN reaction that required hospitalization No Has patient had a PCN reaction occurring within the last 10 years: yes If all of the above answers are "NO", then may proceed with Cephalosporin use.        Medication List     STOP taking these medications    benzonatate 100 MG capsule Commonly known as: Tessalon Perles       TAKE these medications    acetaminophen 500 MG tablet Commonly known as: TYLENOL Take 1,000 mg by mouth every 6 (six) hours as needed for moderate pain or headache.   amLODipine 10 MG tablet Commonly known as: NORVASC Take 1 tablet (10 mg total) by mouth every evening. Hold for now, resume when bp start to trend up What changed:  when to take this additional instructions   ciprofloxacin 500 MG tablet Commonly known as: CIPRO Take 1 tablet (500 mg total) by mouth 2 (two) times daily for 5 days.   dicyclomine 10 MG capsule Commonly known as: BENTYL Take 1 capsule (10 mg total) by mouth 3 (three) times daily as needed for spasms.   feeding supplement Liqd Take 237 mLs by mouth 2 (two) times daily between meals.   furosemide 40 MG tablet Commonly known as: Lasix Take 1 tablet (40 mg total) by mouth daily as needed for edema.   GAS-X PO Take 1 tablet by mouth daily as needed (gas).   insulin aspart 100 UNIT/ML injection Commonly known as: novoLOG Inject 0-9 Units into the skin every 6 (six) hours. Insulin sliding scale: Blood sugar  120-150   3units                       151-200   4units                       201-250   7units                       251- 300  11units                       301-350   15uints                       351-400   20units                       >400         call MD immediately   lisinopril-hydrochlorothiazide 20-12.5 MG tablet Commonly known as: ZESTORETIC Take 2 tablets by mouth daily. Hold for now, resume when blood pressure start to trend  up. What changed: additional instructions   magic mouthwash Soln Take 15 mLs by mouth 4 (four) times daily as needed for mouth pain (sore throat).   metoprolol succinate 25 MG 24 hr tablet  Commonly known as: TOPROL-XL TAKE 1 TABLET BY MOUTH TWICE A DAY   metroNIDAZOLE 500 MG tablet Commonly known as: FLAGYL Take 1 tablet (500 mg total) by mouth every 12 (twelve) hours for 5 days.   Oxycodone HCl 10 MG Tabs Take 1-1.5 tablets (10-15 mg total) by mouth every 4 (four) hours as needed for moderate pain or severe pain (10mg  moderate, 15mg  severe).   pantoprazole 40 MG tablet Commonly known as: PROTONIX Take 1 tablet (40 mg total) by mouth daily.   prenatal multivitamin Tabs tablet Take 1 tablet by mouth daily at 12 noon.   traMADol 50 MG tablet Commonly known as: ULTRAM Take 50 mg by mouth every 6 (six) hours as needed for moderate pain.               Discharge Care Instructions  (From admission, onward)           Start     Ordered   05/01/21 0000  Discharge wound care:       Comments: For general surgery instruction   05/01/21 1014           Allergies  Allergen Reactions   Chlorhexidine Gluconate Hives and Itching    CHG wipes cause itching and hives requiring benadryl   Shellfish Allergy Hives   Penicillins Hives and Rash    Has patient had a PCN reaction causing immediate rash, facial/tongue/throat swelling, SOB or lightheadedness with hypotension: No Has patient had a PCN reaction causing severe rash involving mucus membranes or skin necrosis: hives Has patient had a PCN reaction that required hospitalization No Has patient had a PCN reaction occurring within the last 10 years: yes If all of the above answers are "NO", then may proceed with Cephalosporin use.     Follow-up Information     [name], MD Follow up in 2 week(s).   Specialties: General Surgery, Colon and Rectal Surgery Contact information: [address] [place]  [place] [address] [phone]                  The results of significant diagnostics from this hospitalization (including imaging, microbiology, ancillary and laboratory) are listed below for reference.    Significant Diagnostic Studies: DG Abd 1 View  Result Date: 04/15/2021 CLINICAL DATA:  Incorrect needle count. EXAM: ABDOMEN - 1 VIEW COMPARISON:  None. FINDINGS: There is an extensive amount of overlying tubing and suction bulb which may obscure a small needle. Large amount of soft tissue gas is noted. This may also obscure needle. No definite evidence of surgical needle is noted. IMPRESSION: Exam is significantly limited due to overlying tubing and suction bulb as well as a large amount of soft tissue gas. No definite radiopaque needle is noted, although this may be obscured due to overlying artifact as described above. These results were called by telephone at the time of interpretation on 04/15/2021 at 4:10 pm to provider Dr. Johney Maine, who verbally acknowledged these results. Electronically Signed   By: Marijo Conception M.D.   On: 04/15/2021 16:11   CT Angio Chest PE W and/or Wo Contrast  Result Date: 04/10/2021 CLINICAL DATA:  High probability PE. Being currently treated for abscess diverticulitis. Tachycardia. EXAM: CT ANGIOGRAPHY CHEST CT ABDOMEN AND PELVIS WITH CONTRAST TECHNIQUE: Multidetector CT imaging of the chest was performed using the standard protocol during bolus administration of intravenous contrast. Multiplanar CT image reconstructions and MIPs were obtained to evaluate the vascular anatomy. Multidetector CT imaging of the abdomen  and pelvis was performed using the standard protocol during bolus administration of intravenous contrast. CONTRAST:  97mL OMNIPAQUE IOHEXOL 350 MG/ML SOLN COMPARISON:  CT abdomen and pelvis 04/01/2021. CT abdomen and pelvis 11/27/2020. FINDINGS: CTA CHEST FINDINGS Cardiovascular: Satisfactory opacification of the pulmonary arteries to the segmental level.  No evidence of pulmonary embolism. Normal heart size. No pericardial effusion. Mediastinum/Nodes: No enlarged mediastinal, hilar, or axillary lymph nodes. Thyroid gland, trachea, and esophagus demonstrate no significant findings. Lungs/Pleura: Lungs are clear. No pleural effusion or pneumothorax. Musculoskeletal: No chest wall abnormality. No acute or significant osseous findings. Review of the MIP images confirms the above findings. CT ABDOMEN and PELVIS FINDINGS Hepatobiliary: There is diffuse fatty infiltration of the liver. Gallstones are present. There is no biliary ductal dilatation. Pancreas: Unremarkable. No pancreatic ductal dilatation or surrounding inflammatory changes. Spleen: Normal in size without focal abnormality. Adrenals/Urinary Tract: There is a 3.5 x 2.8 cm stable left adrenal nodule previously characterized as adenoma. Right adrenal gland is within normal limits. There is a cyst in the inferior pole the left kidney measuring 3 cm, unchanged. The kidneys otherwise appear within normal limits. The bladder is within normal limits. Stomach/Bowel: There is no evidence for bowel obstruction. There is marked focal wall thickening of the mid descending colon with surrounding inflammatory stranding. This has slightly increased when compared to the prior study. There is a small amount of extraluminal gas compatible with perforation. Gas has increased when compared to the prior examination. Stomach is within normal limits. Vascular/Lymphatic: No significant vascular findings are present. Aorta and IVC are normal in size. There are prominent and enlarged central mesenteric lymph nodes which are new from the prior examination. The largest lymph node measures 1.5 by 2.4 cm image 4/49. Reproductive: Again seen is a hypodense lesion in the fundus of the uterus containing some calcifications measuring 4.7 cm similar to the prior study. Adnexa are within normal limits. Other: Previously identified percutaneous  drainage catheter in the anterior left abdomen has been pulled back in the interval. The distal catheter tip is in the deep subcutaneous soft tissues of the anterior abdominal wall. Previously identified left lateral percutaneous drainage catheter is unchanged in position. The distal portion of the catheter is in a new lobulated enhancing fluid collection containing air abutting the inflamed portion of the colon. This collection measures 8.5 x 3.5 x 10.0 cm and is now within the adjacent iliopsoas musculature and abutting the left iliacus musculature. This fluid collection also extends through the left lateral abdominal wall on image 4/51, a new finding. There is trace free fluid in the pelvis. There is no focal abdominal wall hernia. Musculoskeletal: No acute or significant osseous findings. Review of the MIP images confirms the above findings. IMPRESSION: 1. Again seen are findings compatible with descending colon diverticulitis with perforation. Free air and inflammation have increased in the interval. 2. New lobulated enhancing fluid collection posterior to this segment of inflamed colon measuring 8.5 x 3.5 x 10.0 cm. This collection now invades the adjacent iliopsoas muscle as well as extends through the left lateral abdominal wall. The tip of the drainage catheter is in this collection. Findings are compatible with abscess. 3. Anterior left percutaneous drainage catheter tip has been pulled back and is now within the subcutaneous tissues. 4. Trace free fluid. 5. No pulmonary embolism.  No acute cardiopulmonary process. 6. Cholelithiasis. 7. Fatty infiltration of the liver. Electronically Signed   By: Ronney Asters M.D.   On: 04/10/2021 19:01   CT ABDOMEN  PELVIS W CONTRAST  Addendum Date: 04/29/2021   ADDENDUM REPORT: 04/29/2021 16:59 ADDENDUM: After discussing the case with Dr. Dwaine Gale in interventional radiology, it was noted that the fluid in the pelvis originally thought to be in the cul-de-sac is likely  within the vagina, best seen on sagittal imaging. Recommend speculum exam. Also, in the left lateral wall in the area of prior abscess in the lateral wall abdominal musculature, some of the abdomen soft tissue appears to be enhancing. While this could be infectious, cannot exclude tumor seeding in the left lateral abdominal wall, measuring 6.9 x 3.8 cm on image 64 of series 2. Electronically Signed   By: Rolm Baptise M.D.   On: 04/29/2021 16:59   Result Date: 04/29/2021 CLINICAL DATA:  Abdominal abscess/infection suspected. History of diverticulitis repair with colostomy and colon cancer EXAM: CT ABDOMEN AND PELVIS WITH CONTRAST TECHNIQUE: Multidetector CT imaging of the abdomen and pelvis was performed using the standard protocol following bolus administration of intravenous contrast. CONTRAST:  26mL OMNIPAQUE IOHEXOL 350 MG/ML SOLN COMPARISON:  04/10/2021 FINDINGS: Lower chest: Small left pleural effusion. Compressive atelectasis in the left lower lobe. Right base atelectasis. Scattered coronary artery calcifications. Hepatobiliary: Gallstones noted within the gallbladder. No focal hepatic abnormality. Pancreas: No focal abnormality or ductal dilatation. Spleen: No focal abnormality.  Normal size. Adrenals/Urinary Tract: Left adrenal mass again noted, measuring 3 cm, unchanged. Right adrenal gland and kidneys unremarkable. Urinary bladder decompressed, grossly unremarkable. Stomach/Bowel: Left lower quadrant ostomy with Hartmann's pouch. No evidence of bowel obstruction. Vascular/Lymphatic: No evidence of aneurysm or adenopathy. Reproductive: Central fibroid with rim calcifications measures up to 4.7 cm. No adnexal mass. Other: Interval removal of previously seen left lower quadrant pigtail drainage catheter. Overall, left lower quadrant fluid collection has improved, but continued fluid collection in the left retroperitoneum, left psoas muscle, and left lateral abdominal wall musculature compatible with  residual abscesses. New fluid collection in the cul-de-sac and wrapping around the uterus concerning for abscess. Musculoskeletal: No acute bony abnormality. IMPRESSION: Continued left retroperitoneal abscesses inferior to the left kidney, involving the left psoas muscle and left abdominal wall musculature. Overall size is decreased since prior study. Interval removal of left lower quadrant abscess drainage catheter. New fluid collection in the cul-de-sac and wrapping around the uterus concerning for abscess. Cholelithiasis. Small left pleural effusion.  Bibasilar atelectasis. Stable left adrenal adenoma. Electronically Signed: By: Rolm Baptise M.D. On: 04/29/2021 14:29   CT ABDOMEN PELVIS W CONTRAST  Result Date: 04/10/2021 CLINICAL DATA:  High probability PE. Being currently treated for abscess diverticulitis. Tachycardia. EXAM: CT ANGIOGRAPHY CHEST CT ABDOMEN AND PELVIS WITH CONTRAST TECHNIQUE: Multidetector CT imaging of the chest was performed using the standard protocol during bolus administration of intravenous contrast. Multiplanar CT image reconstructions and MIPs were obtained to evaluate the vascular anatomy. Multidetector CT imaging of the abdomen and pelvis was performed using the standard protocol during bolus administration of intravenous contrast. CONTRAST:  91mL OMNIPAQUE IOHEXOL 350 MG/ML SOLN COMPARISON:  CT abdomen and pelvis 04/01/2021. CT abdomen and pelvis 11/27/2020. FINDINGS: CTA CHEST FINDINGS Cardiovascular: Satisfactory opacification of the pulmonary arteries to the segmental level. No evidence of pulmonary embolism. Normal heart size. No pericardial effusion. Mediastinum/Nodes: No enlarged mediastinal, hilar, or axillary lymph nodes. Thyroid gland, trachea, and esophagus demonstrate no significant findings. Lungs/Pleura: Lungs are clear. No pleural effusion or pneumothorax. Musculoskeletal: No chest wall abnormality. No acute or significant osseous findings. Review of the MIP images  confirms the above findings. CT ABDOMEN and PELVIS FINDINGS  Hepatobiliary: There is diffuse fatty infiltration of the liver. Gallstones are present. There is no biliary ductal dilatation. Pancreas: Unremarkable. No pancreatic ductal dilatation or surrounding inflammatory changes. Spleen: Normal in size without focal abnormality. Adrenals/Urinary Tract: There is a 3.5 x 2.8 cm stable left adrenal nodule previously characterized as adenoma. Right adrenal gland is within normal limits. There is a cyst in the inferior pole the left kidney measuring 3 cm, unchanged. The kidneys otherwise appear within normal limits. The bladder is within normal limits. Stomach/Bowel: There is no evidence for bowel obstruction. There is marked focal wall thickening of the mid descending colon with surrounding inflammatory stranding. This has slightly increased when compared to the prior study. There is a small amount of extraluminal gas compatible with perforation. Gas has increased when compared to the prior examination. Stomach is within normal limits. Vascular/Lymphatic: No significant vascular findings are present. Aorta and IVC are normal in size. There are prominent and enlarged central mesenteric lymph nodes which are new from the prior examination. The largest lymph node measures 1.5 by 2.4 cm image 4/49. Reproductive: Again seen is a hypodense lesion in the fundus of the uterus containing some calcifications measuring 4.7 cm similar to the prior study. Adnexa are within normal limits. Other: Previously identified percutaneous drainage catheter in the anterior left abdomen has been pulled back in the interval. The distal catheter tip is in the deep subcutaneous soft tissues of the anterior abdominal wall. Previously identified left lateral percutaneous drainage catheter is unchanged in position. The distal portion of the catheter is in a new lobulated enhancing fluid collection containing air abutting the inflamed portion of the  colon. This collection measures 8.5 x 3.5 x 10.0 cm and is now within the adjacent iliopsoas musculature and abutting the left iliacus musculature. This fluid collection also extends through the left lateral abdominal wall on image 4/51, a new finding. There is trace free fluid in the pelvis. There is no focal abdominal wall hernia. Musculoskeletal: No acute or significant osseous findings. Review of the MIP images confirms the above findings. IMPRESSION: 1. Again seen are findings compatible with descending colon diverticulitis with perforation. Free air and inflammation have increased in the interval. 2. New lobulated enhancing fluid collection posterior to this segment of inflamed colon measuring 8.5 x 3.5 x 10.0 cm. This collection now invades the adjacent iliopsoas muscle as well as extends through the left lateral abdominal wall. The tip of the drainage catheter is in this collection. Findings are compatible with abscess. 3. Anterior left percutaneous drainage catheter tip has been pulled back and is now within the subcutaneous tissues. 4. Trace free fluid. 5. No pulmonary embolism.  No acute cardiopulmonary process. 6. Cholelithiasis. 7. Fatty infiltration of the liver. Electronically Signed   By: Ronney Asters M.D.   On: 04/10/2021 19:01   CT Abdomen Pelvis W Contrast  Result Date: 04/03/2021 CLINICAL DATA:  Diverticulitis, status post drainage. EXAM: CT ABDOMEN AND PELVIS WITH CONTRAST TECHNIQUE: Multidetector CT imaging of the abdomen and pelvis was performed using the standard protocol following bolus administration of intravenous contrast. CONTRAST:  84mL OMNIPAQUE IOHEXOL 350 MG/ML SOLN COMPARISON:  CT abdomen and pelvis 02/04/2021; X-ray chest 03/31/2016. FINDINGS: Lower chest: No acute abnormality. Hepatobiliary: Hepatic steatosis. Cholelithiasis. No biliary dilatation. Pancreas: Unremarkable. No pancreatic ductal dilatation or surrounding inflammatory changes. Spleen: Normal in size without  focal abnormality. Adrenals/Urinary Tract: Stable 3.7 cm left adrenal lesion. Right adrenal gland appears normal. No hydronephrosis or renal obstruction is noted. Stable left  renal cyst. No renal or ureteral calculi are noted. Urinary bladder is unremarkable. Stomach/Bowel: The stomach is unremarkable. The surgical drain seen within a small bowel loop in the left lower quadrant on prior exam has been retracted and no longer appears to be within the bowel or peritoneal space. There appears to be significant enhancement and irregularity involving the descending colon with a large amount of scarring and retraction involving adjacent small bowel loops. This does not appear to be significantly changed compared to prior exam and is consistent with a history of diverticulitis. Stable position of percutaneous drainage catheter is seen with tip adjacent to the left psoas muscle. No significant fluid collection remains. Stool is noted in the more distal sigmoid colon and rectum. Vascular/Lymphatic: There appears to be some rotation of mesenteric structures in the right lower quadrant which may be new since prior exam, suggesting partial volvulus or malrotation. There is also noted the interval development of several mesenteric lymph nodes in this area, most likely inflammatory or reactive in etiology. However, no significant bowel wall thickening or dilatation is seen in this area. Reproductive: Calcified uterine fibroid is again noted. No adnexal abnormality is noted. Other: Mild amount of free fluid is noted in the pelvis. No hernia is noted. Musculoskeletal: No acute or significant osseous findings. IMPRESSION: There appears to be significant enhancement and wall thickening involving the descending colon with some degree of traction and involvement of adjacent small bowel loops. This is consistent with the history of diverticulitis and perforation. Stable position of percutaneous drainage catheter is seen adjacent to left  psoas muscle with no significant residual fluid remaining. The other percutaneous drainage catheter that was previously noted to be within small bowel loop on prior exam in the left lower quadrant, has significantly retracted and appears to be outside of the peritoneal space at this time. Since the prior exam, there does appear to be some degree of rotation involving mesenteric vessels and structures in the right lower quadrant, suggesting partial volvulus or malrotation. There is the interval development of several lymph nodes in this area, most likely inflammatory or reactive in etiology. Mild amount of free fluid is also noted in the pelvis. However, no significant bowel wall thickening or dilatation is seen in this area. These results will be called to the ordering clinician or representative by the Radiologist Assistant, and communication documented in the PACS or zVision Dashboard. Stable uterine fibroid. Hepatic steatosis. 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 Cholelithiasis. Electronically Signed   By: Marijo Conception M.D.   On: 04/03/2021 21:53   IR Catheter Tube Change  Result Date: 04/14/2021 CLINICAL DATA:  Large intra-abscess. malfunctioning indwelling drain. EXAM: IR DRAINAGE CATHETER EVALUATION, EXCHANGE AND UPSIZE COMPARISON:  CT Abdomen Pelvis, 04/11/2019. IR fluoroscopy, 02/19/2021. CONTRAST:  10 mL Omnipaque 300-administered via the percutaneous drainage catheter. MEDICATIONS: None. ANESTHESIA/SEDATION: Local anesthetic was administered. FLUOROSCOPY TIME:  3 minutes 33 seconds.  10 mGy. TECHNIQUE: Patient was positioned supine on the fluoroscopy table. The external portion of the existing percutaneous drainage catheter as well as the surrounding skin was prepped and draped in usual sterile fashion. A preprocedural spot fluoroscopic image was obtained of the existing percutaneous drainage catheter. A small amount of contrast was injected via the existing percutaneous drainage catheter and several  fluoroscopic images were obtained in various obliquities. The external portion of the percutaneous drainage catheter was cut and cannulated with a short Amplatz wire. Under intermittent fluoroscopic guidance, the existing percutaneous drainage catheter was exchanged for a  new 58 French percutaneous drainage catheter with end coiled and locked within the decompressed abscess cavity. Contrast injection confirmed appropriate position functionality of the percutaneous drainage catheter. The percutaneous drainage catheter was connected to a gravity drainage bag and secured in place within 0-silk interrupted suture and a StatLock device. A dressing was applied. The patient tolerated the procedure well without immediate postprocedural complication. FINDINGS: 1. Malfunctioning drain with large residual intra-abdominal abscess. 2. Multi compartment abscess, with patient unable to tolerate further investigation and multiple drain placement. IMPRESSION: Successful fluoroscopic-guided LEFT lower quadrant drainage catheter exchange and upsize, as above. PLAN: Continue with previous drain care, including routine flushes for catheter patency. Interventional radiology will follow patient. Drain interrogation to be performed in 2 weeks, or if earlier suspected catheter malfunction. Michaelle Birks, MD [facility] Electronically Signed   By: Michaelle Birks M.D.   On: 04/14/2021 10:52   DG CHEST PORT 1 VIEW  Result Date: 04/16/2021 CLINICAL DATA:  Respiratory failure EXAM: PORTABLE CHEST 1 VIEW COMPARISON:  Chest x-ray 04/15/2021 FINDINGS: Endotracheal tube tip is 2.7 cm above the carina. Enteric tube tip is in the distal stomach region. Right PICC tip is near the superior aspect of the SVC. Heart size and mediastinum are within normal limits. No consolidation or edema. No pleural effusion or pneumothorax. Small subcutaneous emphysema bilaterally appears similar to slightly  decreased since previous study. IMPRESSION: Medical devices as described.  No acute process identified. Electronically Signed   By: Ofilia Neas M.D.   On: 04/16/2021 11:46   DG CHEST PORT 1 VIEW  Result Date: 04/15/2021 CLINICAL DATA:  Intubation. EXAM: PORTABLE CHEST 1 VIEW COMPARISON:  Radiographs 04/12/2021 and 04/10/2021.  CT 04/10/2021. FINDINGS: 1713 hours. Tip of the endotracheal tube overlies the mid trachea. An enteric tube projects below the diaphragm into the right upper quadrant of the abdomen, likely in the distal stomach. Right arm PICC projects to the level of the superior SVC. There is mild patient rotation to the right and elevation of the right hemidiaphragm associated with increased right basilar subsegmental atelectasis. The lungs are otherwise clear. There is no pleural effusion or pneumothorax. Suggested soft tissue emphysema within the abdominal flanks bilaterally. IMPRESSION: 1. Satisfactory position of the endotracheal and enteric tubes. 2. Right arm PICC appears to have been pulled back to the level of the upper SVC. 3. Suggested soft tissue emphysema within both abdominal flanks. Electronically Signed   By: Richardean Sale M.D.   On: 04/15/2021 19:26   DG CHEST PORT 1 VIEW  Result Date: 04/12/2021 CLINICAL DATA:  PICC placement EXAM: PORTABLE CHEST 1 VIEW COMPARISON:  04/10/2021 FINDINGS: Right arm PICC tip in the proximal SVC. Lungs remain clear without infiltrate or effusion. IMPRESSION: PICC tip in the proximal SVC.  Lungs are clear Electronically Signed   By: Franchot Gallo M.D.   On: 04/12/2021 19:55   DG Chest Portable 1 View  Result Date: 04/10/2021 CLINICAL DATA:  Weakness. EXAM: PORTABLE CHEST 1 VIEW COMPARISON:  March 31, 2016 FINDINGS: The heart size and mediastinal contours are within normal limits. Both lungs are clear. The visualized skeletal structures are unremarkable. IMPRESSION: No active disease. Electronically Signed   By: Virgina Norfolk M.D.    On: 04/10/2021 15:51   ECHOCARDIOGRAM COMPLETE  Result Date: 04/14/2021    ECHOCARDIOGRAM REPORT   Patient Name:   Mikayla Rios Date of Exam: 04/14/2021 Medical Rec #:  [id number]      Height:  63.0 in Accession #:    [id number]     Weight:       200.0 lb Date of Birth:  1985-07-22       BSA:          1.934 m Patient Age:    35 years       BP:           109/82 mmHg Patient Gender: F              HR:           133 bpm. Exam Location:  Inpatient Procedure: 2D Echo, Cardiac Doppler and Color Doppler Indications:    I47.2 Ventricular tachycardia  History:        Patient has prior history of Echocardiogram examinations, most                 recent 11/09/2019. Arrythmias:Prolonged QT interval; Risk                 Factors:Hypertension.  Sonographer:    Glo Herring Referring Phys: Theola Sequin IMPRESSIONS  1. Left ventricular ejection fraction, by estimation, is 60 to 65%. The left ventricle has normal function. The left ventricle has no regional wall motion abnormalities. Indeterminate diastolic filling due to E-A fusion.  2. Right ventricular systolic function is normal. The right ventricular size is normal.  3. The mitral valve is normal in structure. No evidence of mitral valve regurgitation. No evidence of mitral stenosis.  4. The aortic valve is normal in structure. Aortic valve regurgitation is not visualized. No aortic stenosis is present.  5. The inferior vena cava is normal in size with greater than 50% respiratory variability, suggesting right atrial pressure of 3 mmHg. FINDINGS  Left Ventricle: Left ventricular ejection fraction, by estimation, is 60 to 65%. The left ventricle has normal function. The left ventricle has no regional wall motion abnormalities. The left ventricular internal cavity size was normal in size. There is  no left ventricular hypertrophy. Indeterminate diastolic filling due to E-A fusion. Right Ventricle: The right ventricular size is normal. No increase in right  ventricular wall thickness. Right ventricular systolic function is normal. Left Atrium: Left atrial size was normal in size. Right Atrium: Right atrial size was normal in size. Pericardium: There is no evidence of pericardial effusion. Mitral Valve: The mitral valve is normal in structure. No evidence of mitral valve regurgitation. No evidence of mitral valve stenosis. Tricuspid Valve: The tricuspid valve is normal in structure. Tricuspid valve regurgitation is not demonstrated. No evidence of tricuspid stenosis. Aortic Valve: The aortic valve is normal in structure. Aortic valve regurgitation is not visualized. No aortic stenosis is present. Aortic valve mean gradient measures 4.0 mmHg. Aortic valve peak gradient measures 8.6 mmHg. Pulmonic Valve: The pulmonic valve was normal in structure. Pulmonic valve regurgitation is not visualized. No evidence of pulmonic stenosis. Aorta: The aortic root is normal in size and structure. Venous: The inferior vena cava is normal in size with greater than 50% respiratory variability, suggesting right atrial pressure of 3 mmHg. IAS/Shunts: No atrial level shunt detected by color flow Doppler.  LEFT VENTRICLE PLAX 2D LVIDd:         3.80 cm LVIDs:         2.30 cm LV PW:         1.00 cm LV IVS:        1.00 cm  RIGHT VENTRICLE RV S prime:     21.90 cm/s LEFT ATRIUM  Index LA diam:        3.50 cm 1.81 cm/m LA Vol (A2C):   32.1 ml 16.60 ml/m LA Vol (A4C):   29.5 ml 15.26 ml/m LA Biplane Vol: 32.3 ml 16.70 ml/m  AORTIC VALVE                   PULMONIC VALVE AV Vmax:           147.00 cm/s PV Vmax:       1.21 m/s AV Vmean:          96.600 cm/s PV Peak grad:  5.9 mmHg AV VTI:            0.168 m AV Peak Grad:      8.6 mmHg AV Mean Grad:      4.0 mmHg LVOT Vmax:         85.50 cm/s LVOT Vmean:        53.100 cm/s LVOT VTI:          0.097 m LVOT/AV VTI ratio: 0.58  AORTA Ao Root diam: 3.00 cm Ao Asc diam:  2.90 cm  SHUNTS Systemic VTI: 0.10 m Dani Gobble Croitoru MD Electronically  signed by Sanda Klein MD Signature Date/Time: 04/14/2021/1:18:01 PM    Final    VAS [place] UPPER EXTREMITY VENOUS DUPLEX  Result Date: 04/20/2021 UPPER VENOUS STUDY  Patient Name:  Mikayla Rios  Date of Exam:   04/19/2021 Medical Rec #: [id number]       Accession #:    [id number] Date of Birth: September 01, 1985        Patient Gender: F Patient Age:   31 years Exam Location:  [facility] Procedure:      VAS [place] UPPER EXTREMITY VENOUS DUPLEX Referring Phys: Jolaine Artist MEMON --------------------------------------------------------------------------------  Indications: Edema Limitations: Poor ultrasound/tissue interface and body habitus. Comparison Study: No prior study Performing Technologist: Maudry Mayhew MHA, RDMS, RVT, RDCS  Examination Guidelines: A complete evaluation includes B-mode imaging, spectral Doppler, color Doppler, and power Doppler as needed of all accessible portions of each vessel. Bilateral testing is considered an integral part of a complete examination. Limited examinations for reoccurring indications may be performed as noted.  Right Findings: +----------+------------+---------+-----------+----------+-------+ RIGHT     CompressiblePhasicitySpontaneousPropertiesSummary +----------+------------+---------+-----------+----------+-------+ Subclavian               Yes       Yes                      +----------+------------+---------+-----------+----------+-------+  Left Findings: +----------+------------+---------+-----------+----------+--------------+ LEFT      CompressiblePhasicitySpontaneousProperties   Summary     +----------+------------+---------+-----------+----------+--------------+ IJV           Full       Yes       Yes                             +----------+------------+---------+-----------+----------+--------------+ Subclavian    Full       Yes       Yes                              +----------+------------+---------+-----------+----------+--------------+ Axillary      Full       Yes       Yes                             +----------+------------+---------+-----------+----------+--------------+  Brachial      Full       Yes       Yes                             +----------+------------+---------+-----------+----------+--------------+ Radial        Full                                                 +----------+------------+---------+-----------+----------+--------------+ Ulnar         Full                                                 +----------+------------+---------+-----------+----------+--------------+ Cephalic      Full                                                 +----------+------------+---------+-----------+----------+--------------+ Basilic                                             Not visualized +----------+------------+---------+-----------+----------+--------------+  Summary:  Right: No evidence of thrombosis in the subclavian.  Left: No evidence of deep vein thrombosis in the upper extremity. No evidence of superficial vein thrombosis involving visualized veins of the left upper extremity.  *See table(s) above for measurements and observations.  Diagnosing physician: Monica Martinez MD Electronically signed by Monica Martinez MD on 04/20/2021 at 11:59:48 AM.    Final    [place] EKG Site Rite  Result Date: 04/17/2021 If Site Rite image not attached, placement could not be confirmed due to current cardiac rhythm.  [place] EKG SITE RITE  Result Date: 04/12/2021 If Site Rite image not attached, placement could not be confirmed due to current cardiac rhythm.   Microbiology: No results found for this or any previous visit (from the past 240 hour(s)).   Labs: Basic Metabolic Panel: Recent Labs  Lab 04/25/21 0530 04/26/21 0515 04/27/21 0357 04/28/21 0449 04/29/21 0230 05/01/21 0627  NA 136 134* 138 140 139 139  K 4.7 4.0  3.4* 3.5 3.9 3.2*  CL 99 95* 98 101 103 101  CO2 31 32 32 30 27 28   GLUCOSE 106* 93 89 93 86 97  BUN 26* 18 15 14 17 20   CREATININE 0.35* 0.41* 0.55 0.50 0.56 0.56  CALCIUM 8.5* 8.3* 8.3* 8.6* 8.7* 9.0  MG 2.1 1.8 2.0 1.8 2.4  --   PHOS 5.3* 5.5* 4.9* 4.2 4.6  --    Liver Function Tests: Recent Labs  Lab 04/25/21 0530 04/26/21 0515 04/27/21 0357 04/28/21 0449 04/29/21 0230  AST 12* 15 15 12* 13*  ALT 10 12 13 13 11   ALKPHOS 108 87 84 75 72  BILITOT 0.3 0.7 0.6 0.4 0.4  PROT 5.8* 6.4* 6.6 6.6 7.1  ALBUMIN 2.3* 2.5* 2.5* 2.4* 2.6*   No results for input(s): LIPASE, AMYLASE in the last 168 hours. No results for input(s): AMMONIA in the last 168 hours. CBC: Recent Labs  Lab 04/25/21 0530 04/25/21 2258 04/26/21 0515 04/27/21 0357 04/28/21 0449 04/29/21 0230 05/01/21 0627  WBC 14.2*  --  14.1* 13.2* 11.1* 12.6* 12.1*  NEUTROABS 10.9*  --  10.8* 9.9* 7.9* 9.5*  --   HGB 6.5*   < > 9.9* 9.7* 9.6* 9.8* 10.8*  HCT 20.8*   < > 30.7* 30.0* 30.5* 31.0* 34.4*  MCV 93.7  --  86.7 88.5 89.7 91.2 90.3  PLT 709*  --  652* 716* 751* 828* 790*   < > = values in this interval not displayed.   Cardiac Enzymes: No results for input(s): CKTOTAL, CKMB, CKMBINDEX, TROPONINI in the last 168 hours. BNP: BNP (last 3 results) No results for input(s): BNP in the last 8760 hours.  ProBNP (last 3 results) No results for input(s): PROBNP in the last 8760 hours.  CBG: Recent Labs  Lab 04/30/21 0737 04/30/21 1146 04/30/21 1647 05/01/21 0008 05/01/21 0556  GLUCAP 97 95 74 89 88       Signed:  Florencia Reasons MD, PhD, FACP  [facility] 05/01/2021, 10:31 AM

## 2021-05-01 NOTE — Progress Notes (Signed)
Initial Nutrition Assessment  DOCUMENTATION CODES:   Obesity unspecified  INTERVENTION:  Provide Ensure Enlive po BID, each supplement provides 350 kcal and 20 grams of protein  Encourage adequate PO intake.   Recommend obtaining new weight to fully assess weight trends.   NUTRITION DIAGNOSIS:   Increased nutrient needs related to chronic illness (cancer) as evidenced by estimated needs.  GOAL:   Patient will meet greater than or equal to 90% of their needs  MONITOR:   PO intake, Supplement acceptance, Labs, Weight trends, Skin, I & O's  REASON FOR ASSESSMENT:   Malnutrition Screening Tool    ASSESSMENT:   35 year old female with past medical history of hypertension, inappropriate sinus tachycardia, obesity, obstructive sleep apnea and diverticulosis with complicated diverticulitis and abscess formation 11/2020 status post IR guided placement of 2 left lower quadrant drains. Pt s/p laparoscopic assisted hartman (L colectomy/end colostomy), small bowel resection, L retroperitoneal/flank abscess drainage 11/9. Pt with colonic adenocarcinoma. Pt admitted to CIR.  Pt unavailable during attempted time of contact. Previous meal completion 95-100%. Noted no new weight recorded. However from weight 11/18, pt with a pt a 25% weight loss in 9 months, which is significant for time frame. RD to order nutritional supplements to aid in caloric and protein needs. Recommend obtaining new weight to fully assess weight trends. Unable to complete Nutrition-Focused physical exam at this time.   Labs and medications reviewed.   Diet Order:   Diet Order             Diet Carb Modified Fluid consistency: Thin; Room service appropriate? Yes  Diet effective now                   EDUCATION NEEDS:   Not appropriate for education at this time  Skin:  Skin Assessment: Skin Integrity Issues: Skin Integrity Issues:: Incisions, Wound VAC Wound Vac: abdomen Incisions: abdomen  Last BM:   11/25 colostomy  Height:   Ht Readings from Last 1 Encounters:  04/15/21 5\' 3"  (1.6 m)    Weight:   Wt Readings from Last 1 Encounters:  04/24/21 102.4 kg   Estimated Nutritional Needs:   Kcal:  2100-2300  Protein:  110-120 grams  Fluid:  >/= 2 L/day  Corrin Parker, MS, RD, LDN RD pager number/after hours weekend pager number on Amion.

## 2021-05-01 NOTE — Progress Notes (Signed)
Patient transported to [facility] today & report given to Autumn RN @ 1137. All qestions & concerns addressed. Patients belonging packed & with her (black bag, pt belonging bag & phone charger.)  Pt updated husband of transport. Ostomy intact & woundvac (PICC to be removed at con *see note) No c/o pain this am A/Ox4

## 2021-05-01 NOTE — H&P (Signed)
Physical Medicine and Rehabilitation Admission H&P        Chief Complaint  Patient presents with   Weakness   Tachycardia  : HPI: Mikayla Rios is a 35 year old right-handed female with history of hypertension, obesity with BMI 39.99, inappropriate sinus tachycardia followed by Dr. Gardiner Rhyme.  Per chart review patient lives with spouse.  Two-level home one half bath on main level one-step to entry.  Independent prior to admission.  Patient recently hospitalized [facility] long hospital 5/31 until 6/6 for abdominal pain with initial work-up showing perforated diverticulitis.  She was initially admitted to the surgical service treated with intravenous antibiotics and bowel rest.  Patient had worsening leukocytosis repeat CT showed diverticular abscess amenable to drainage and IR consulted and underwent CT-guided drain placement 11/07/2020.  She was discharged to home ambulating extended distances.  Presented 04/10/2021 with progressive generalized weakness and bouts of lightheadedness.  She has had decrease in appetite and gradual weight loss.  She had been scheduled to see gastroenterology 10/6 with eventual plan to undergo endoscopic work-up to rule out any malignancy.  Upon evaluation in the emergency department underwent CT imaging of the chest abdomen pelvis.  CT angiogram of the chest showed no evidence of pulmonary emboli however CT imaging of abdomen and pelvis did reveal new enhancing fluid collection posterior to segment of inflamed colon measuring 8.5 x 3.5 x 10 cm invading the adjacent iliopsoas muscle as well extending through the left lateral abdominal wall.  Posterior drain was noted to be in middle of this abscess.  The anterior drain appeared to be completely dislodged into the subcutaneous tissue.  This did show dramatic change compared to patient's recent CT imaging of the abdomen pelvis 10/28.  Patient also found to have multiple SIRS criteria with concern for early sepsis and therefore was  given 1 L bolus of lactated Ringer.  She was hypokalemic with intravenous potassium chloride initiated.  General surgery as well as interventional radiology and gastroenterology consulted patient did undergo drainage tube exchange 04/13/2021.  She underwent laparoscopic-assisted [place] left colectomy/end colostomy mobilization of splenic flexure of colon and small bowel resection left retroperitoneal/flank abscess drainage for chronic perforated colon with retroperitoneal abscess partial small bowel obstruction and colon obstruction 04/15/2021 per Dr. Johney Maine and pathology obtained diagnosed pT4NO stage II sigmoid colon cancer.patient did require vasopressor therapy postoperatively and weaned off pressors.  Leukocytosis 12,600 follow-up CT of abdomen 04/29/2021 showed continued left retroperitoneal abscess inferior to the left kidney overall size decreased since prior study.  New fluid collection in the cul-de-sac and wrapping around the uterus concerning for abscess reviewed by gynecology and did not feel clinically significant amount of fluid that would need intervention and patient currently is maintained on empiric meropenem changed to Cipro and Flagyl 1125 2022 x 5 days..  Wound care ongoing with colostomy in place as well as right JP drain and wound VAC.  Required mechanical ventilation postoperatively for acute respiratory failure extubated 11/10.  Bouts of prolonged QTC from hypokalemia and hypomagnesemia with supplement added.  Repeat EKG showed improvement in the QTC.  Echocardiogram with ejection fraction of 60 to 65% no wall motion abnormalities.  Dr [name] of medical oncology consulted.  Plan is for adjuvant chemotherapy FOLFOX or Capox for 3 to 6 months once recovered from latest surgery and rehabilitation.  Hospital course acute blood loss anemia transfused 2 units packed red blood cells FFP 04/19/2021 and latest hemoglobin 10.8.  Anasarca related to hypoalbuminemia and aggressive volume repletion she  did  receive diuresis.  She did receive TPN for nutritional support.  She is now tolerating a regular consistency diet.  Therapy evaluations completed due to patient's decreased functional mobility was admitted for a comprehensive rehab program.   Review of Systems  Constitutional:  Positive for malaise/fatigue and weight loss. Negative for fever.  HENT:  Negative for hearing loss.   Eyes:  Negative for blurred vision and double vision.  Respiratory:  Positive for shortness of breath. Negative for cough.   Cardiovascular:  Positive for palpitations and leg swelling. Negative for chest pain.  Gastrointestinal:  Positive for abdominal pain and nausea.  Genitourinary:  Negative for dysuria, flank pain and hematuria.  Musculoskeletal:  Positive for joint pain and myalgias.  Skin:  Negative for rash.  Neurological:  Positive for dizziness.  All other systems reviewed and are negative.     Past Medical History:  Diagnosis Date   Allergy      seasonal   Anemia     Blood infection 1985   Blood transfusion without reported diagnosis     Diverticulitis     Hypertension     Obesity     Sleep apnea           Past Surgical History:  Procedure Laterality Date   COLECTOMY WITH COLOSTOMY CREATION/HARTMANN PROCEDURE N/A 04/15/2021    Procedure: COLOSTOMY CREATION/HARTMANN PROCEDURE;  Surgeon: [name], MD;  Location: [facility];  Service: General;  Laterality: N/A;   IR CATHETER TUBE CHANGE   12/12/2020   IR CATHETER TUBE CHANGE   01/27/2021   IR CATHETER TUBE CHANGE   02/19/2021   IR CATHETER TUBE CHANGE   04/13/2021   IR RADIOLOGIST EVAL & MGMT   11/27/2020   IR RADIOLOGIST EVAL & MGMT   12/11/2020   IR RADIOLOGIST EVAL & MGMT   01/07/2021   IR RADIOLOGIST EVAL & MGMT   02/04/2021   IR SINUS/FIST TUBE CHK-NON GI   12/12/2020   IR SINUS/FIST TUBE CHK-NON GI   02/19/2021   LAPAROSCOPIC PARTIAL COLECTOMY N/A 04/15/2021    Procedure: LAPAROSCOPIC ASSISTED HARTMANN RESECTION;  Surgeon: [name], MD;  Location: [facility];  Service: General;  Laterality: N/A;         Family History  Problem Relation Age of Onset   Cancer Mother          breast, and in remission   Diabetes Mother     Hypertension Mother     Colon cancer Mother          dx at age 72   Congestive Heart Failure Father     Hypertension Father     Heart disease Paternal Aunt     Cancer Paternal Uncle          colon   Diabetes Maternal Grandmother     Hypertension Maternal Grandmother     Diabetes Maternal Grandfather     Kidney disease Maternal Grandfather     Diabetes Paternal Grandmother     Cancer Paternal Grandfather          colon   Autism Son      Social History:  reports that she has never smoked. She has never used smokeless tobacco. She reports that she does not currently use alcohol. She reports that she does not use drugs. Allergies:       Allergies  Allergen Reactions   Chlorhexidine Gluconate Hives and Itching      CHG wipes cause itching and hives requiring benadryl  Shellfish Allergy Hives   Penicillins Hives and Rash      Has patient had a PCN reaction causing immediate rash, facial/tongue/throat swelling, SOB or lightheadedness with hypotension: No Has patient had a PCN reaction causing severe rash involving mucus membranes or skin necrosis: hives Has patient had a PCN reaction that required hospitalization No Has patient had a PCN reaction occurring within the last 10 years: yes If all of the above answers are "NO", then may proceed with Cephalosporin use.            Medications Prior to Admission  Medication Sig Dispense Refill   acetaminophen (TYLENOL) 500 MG tablet Take 1,000 mg by mouth every 6 (six) hours as needed for moderate pain or headache.       amLODipine (NORVASC) 10 MG tablet TAKE 1 TABLET BY MOUTH EVERY DAY (Patient taking differently: Take 10 mg by mouth every evening.) 90 tablet 3   dicyclomine (BENTYL) 10 MG capsule Take 1 capsule (10 mg total) by mouth 3  (three) times daily as needed for spasms. 60 capsule 2   lisinopril-hydrochlorothiazide (ZESTORETIC) 20-12.5 MG tablet TAKE 2 TABLETS BY MOUTH EVERY DAY (Patient taking differently: Take 2 tablets by mouth daily.) 180 tablet 3   Prenatal Vit-Fe Fumarate-FA (PRENATAL MULTIVITAMIN) TABS tablet Take 1 tablet by mouth daily at 12 noon.       Simethicone (GAS-X PO) Take 1 tablet by mouth daily as needed (gas).       traMADol (ULTRAM) 50 MG tablet Take 50 mg by mouth every 6 (six) hours as needed for moderate pain.       benzonatate (TESSALON PERLES) 100 MG capsule Take 1 capsule (100 mg total) by mouth every 6 (six) hours as needed. (Patient not taking: Reported on 04/10/2021) 30 capsule 1      Drug Regimen Review Drug regimen was reviewed and remains appropriate with no significant issues identified   Home: Home Living Family/patient expects to be discharged to:: Private residence Living Arrangements: Spouse/significant other, Children Available Help at Discharge: Family, Available PRN/intermittently Type of Home:  (townhome) Home Access: Stairs to enter Technical brewer of Steps: 1 Home Layout: Two level, 1/2 bath on main level Alternate Level Stairs-Number of Steps: 14 Bathroom Shower/Tub: Optometrist: No Home Equipment: None  Lives With: Spouse   Functional History: Prior Function Prior Level of Function : Needs assist Physical Assist : Mobility (physical), ADLs (physical) Mobility (physical): Transfers, Stairs (reports husband has been helping her "up") Mobility Comments: Pt reports has had 2 drains since May 2022, spouse assists with transfers and stairs when he is home, did not use device, able to perform limited transfers while spouse at work until ~3 days ago. ADLs Comments: Pt reports has been performing sponge baths, spouse assists as needed, children perform household chores   Functional Status:  Mobility: Bed  Mobility Overal bed mobility: Needs Assistance Bed Mobility: Supine to Sit, Sit to Supine Rolling: Max assist Sidelying to sit: Mod assist, HOB elevated Supine to sit: Mod assist Sit to supine: Max assist General bed mobility comments: increased time, use of rail.  difficulty due to recent ABD surgery. Transfers Overall transfer level: Needs assistance Equipment used: Rolling walker (2 wheels) Transfers: Sit to/from Stand Sit to Stand: Min assist Bed to/from chair/wheelchair/BSC transfer type:: Stand pivot Stand pivot transfers: Min guard, +2 safety/equipment General transfer comment: Pt stood from elevated bed with min A of 1 with increased time, cues for momentum and hand placement.  Ambulation/Gait Ambulation/Gait assistance: Supervision, Min guard Gait Distance (Feet): 22 Feet Assistive device: Rolling walker (2 wheels) Gait Pattern/deviations: Step-to pattern, Decreased stride length General Gait Details: pt made several laps around room with increased time for a total of 22 feet.  HR increased to 168 pt was asymptomatic Gait velocity: decreased   ADL: ADL Overall ADL's : Needs assistance/impaired Eating/Feeding: Set up, Sitting Grooming: Minimal assistance, Bed level Grooming Details (indicate cue type and reason): patient needed min A to wash face sitting in bed with increased edema in BUE limiting ROM and reported "heaviness" impacting participation in tasks. Upper Body Bathing: Set up, Sitting Lower Body Bathing: Maximal assistance, Sitting/lateral leans Upper Body Dressing : Set up, Sitting Lower Body Dressing: Maximal assistance, Sitting/lateral leans Lower Body Dressing Details (indicate cue type and reason): patient was educated on LB dressing with AE sitting on edge of bed. patient was able to don sock with min A to increase independence in ADLs. patient was educated on total hip kit and toileting wands/tongs. patient reported she would look into them. Toilet Transfer:  Min guard, BSC, Squat-pivot, Rolling walker (2 wheels) Toileting- Clothing Manipulation and Hygiene: Total assistance, Sit to/from stand Toileting - Clothing Manipulation Details (indicate cue type and reason): patient reliant on UEs on walker - needs assistance for hygiene and clothing management. Functional mobility during ADLs: Min guard, Rolling walker (2 wheels) General ADL Comments: patient sat at edge of bed for > than 5 minutes. Therapist assisted patient with washing her back and applying lotion.   Cognition: Cognition Overall Cognitive Status: Within Functional Limits for tasks assessed Orientation Level: Oriented X4 Cognition Arousal/Alertness: Awake/alert Behavior During Therapy: WFL for tasks assessed/performed Overall Cognitive Status: Within Functional Limits for tasks assessed General Comments: AxO x 3 very pleasant/motivated   Physical Exam: Blood pressure (!) 134/97, pulse (!) 107, temperature 98.2 F (36.8 C), temperature source Oral, resp. rate 20, height 5' 3"  (1.6 m), weight 102.4 kg, last menstrual period 04/10/2021, SpO2 100 %. Physical Exam Abdominal:     Comments: Colostomy in place and functioning.  Right JP drain in place.  Wound VAC in place.  Neurological:     Comments: Patient is alert.  No acute distress.  Oriented x3 and follows commands.      General: No acute distress Mood and affect are appropriate Heart: Regular rate and rhythm no rubs murmurs or extra sounds Lungs: Clear to auscultation, breathing unlabored, no rales or wheezes Abdomen: Positive bowel sounds, LLQ colostomy , Midline wound vac Extremities: No clubbing, cyanosis, or edema Skin: No evidence of breakdown, no evidence of rash Neurologic: motor strength is 5/5 in bilateral deltoid, bicep, tricep, grip,4- hip flexor, knee extensors, ankle dorsiflexor and plantar flexor Sensory exam normal sensation to light touch  in bilateral upper and lower extremities  Musculoskeletal: Full range  of motion in all 4 extremities. No joint swelling  Lab Results Last 48 Hours        Results for orders placed or performed during the hospital encounter of 04/10/21 (from the past 48 hour(s))  Glucose, capillary     Status: None    Collection Time: 04/29/21  5:56 AM  Result Value Ref Range    Glucose-Capillary 90 70 - 99 mg/dL      Comment: Glucose reference range applies only to samples taken after fasting for at least 8 hours.  Glucose, capillary     Status: None    Collection Time: 04/29/21  7:32 AM  Result Value Ref Range  Glucose-Capillary 87 70 - 99 mg/dL      Comment: Glucose reference range applies only to samples taken after fasting for at least 8 hours.  Glucose, capillary     Status: None    Collection Time: 04/29/21 12:04 PM  Result Value Ref Range    Glucose-Capillary 85 70 - 99 mg/dL      Comment: Glucose reference range applies only to samples taken after fasting for at least 8 hours.  Glucose, capillary     Status: None    Collection Time: 04/29/21  5:15 PM  Result Value Ref Range    Glucose-Capillary 75 70 - 99 mg/dL      Comment: Glucose reference range applies only to samples taken after fasting for at least 8 hours.  Glucose, capillary     Status: Abnormal    Collection Time: 04/30/21 12:02 AM  Result Value Ref Range    Glucose-Capillary 145 (H) 70 - 99 mg/dL      Comment: Glucose reference range applies only to samples taken after fasting for at least 8 hours.  Glucose, capillary     Status: None    Collection Time: 04/30/21  5:34 AM  Result Value Ref Range    Glucose-Capillary 91 70 - 99 mg/dL      Comment: Glucose reference range applies only to samples taken after fasting for at least 8 hours.  Glucose, capillary     Status: Abnormal    Collection Time: 04/30/21  6:13 AM  Result Value Ref Range    Glucose-Capillary 105 (H) 70 - 99 mg/dL      Comment: Glucose reference range applies only to samples taken after fasting for at least 8 hours.  Glucose,  capillary     Status: None    Collection Time: 04/30/21  7:37 AM  Result Value Ref Range    Glucose-Capillary 97 70 - 99 mg/dL      Comment: Glucose reference range applies only to samples taken after fasting for at least 8 hours.  Glucose, capillary     Status: None    Collection Time: 04/30/21 11:46 AM  Result Value Ref Range    Glucose-Capillary 95 70 - 99 mg/dL      Comment: Glucose reference range applies only to samples taken after fasting for at least 8 hours.  Glucose, capillary     Status: None    Collection Time: 04/30/21  4:47 PM  Result Value Ref Range    Glucose-Capillary 74 70 - 99 mg/dL      Comment: Glucose reference range applies only to samples taken after fasting for at least 8 hours.  Glucose, capillary     Status: None    Collection Time: 05/01/21 12:08 AM  Result Value Ref Range    Glucose-Capillary 89 70 - 99 mg/dL      Comment: Glucose reference range applies only to samples taken after fasting for at least 8 hours.       Imaging Results (Last 48 hours)  CT ABDOMEN PELVIS W CONTRAST   Addendum Date: 04/29/2021   ADDENDUM REPORT: 04/29/2021 16:59 ADDENDUM: After discussing the case with Dr. Dwaine Gale in interventional radiology, it was noted that the fluid in the pelvis originally thought to be in the cul-de-sac is likely within the vagina, best seen on sagittal imaging. Recommend speculum exam. Also, in the left lateral wall in the area of prior abscess in the lateral wall abdominal musculature, some of the abdomen soft tissue appears to be enhancing. While  this could be infectious, cannot exclude tumor seeding in the left lateral abdominal wall, measuring 6.9 x 3.8 cm on image 64 of series 2. Electronically Signed   By: Rolm Baptise M.D.   On: 04/29/2021 16:59    Result Date: 04/29/2021 CLINICAL DATA:  Abdominal abscess/infection suspected. History of diverticulitis repair with colostomy and colon cancer EXAM: CT ABDOMEN AND PELVIS WITH CONTRAST TECHNIQUE:  Multidetector CT imaging of the abdomen and pelvis was performed using the standard protocol following bolus administration of intravenous contrast. CONTRAST:  32m OMNIPAQUE IOHEXOL 350 MG/ML SOLN COMPARISON:  04/10/2021 FINDINGS: Lower chest: Small left pleural effusion. Compressive atelectasis in the left lower lobe. Right base atelectasis. Scattered coronary artery calcifications. Hepatobiliary: Gallstones noted within the gallbladder. No focal hepatic abnormality. Pancreas: No focal abnormality or ductal dilatation. Spleen: No focal abnormality.  Normal size. Adrenals/Urinary Tract: Left adrenal mass again noted, measuring 3 cm, unchanged. Right adrenal gland and kidneys unremarkable. Urinary bladder decompressed, grossly unremarkable. Stomach/Bowel: Left lower quadrant ostomy with Hartmann's pouch. No evidence of bowel obstruction. Vascular/Lymphatic: No evidence of aneurysm or adenopathy. Reproductive: Central fibroid with rim calcifications measures up to 4.7 cm. No adnexal mass. Other: Interval removal of previously seen left lower quadrant pigtail drainage catheter. Overall, left lower quadrant fluid collection has improved, but continued fluid collection in the left retroperitoneum, left psoas muscle, and left lateral abdominal wall musculature compatible with residual abscesses. New fluid collection in the cul-de-sac and wrapping around the uterus concerning for abscess. Musculoskeletal: No acute bony abnormality. IMPRESSION: Continued left retroperitoneal abscesses inferior to the left kidney, involving the left psoas muscle and left abdominal wall musculature. Overall size is decreased since prior study. Interval removal of left lower quadrant abscess drainage catheter. New fluid collection in the cul-de-sac and wrapping around the uterus concerning for abscess. Cholelithiasis. Small left pleural effusion.  Bibasilar atelectasis. Stable left adrenal adenoma. Electronically Signed: By: KRolm BaptiseM.D.  On: 04/29/2021 14:29             Medical Problem List and Plan: 1.  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  Status post laparoscopic-assisted [place]mann left colectomy/end colostomy, small bowel resection, left retroperitoneal/flank abscess drainage 04/15/2021.  JP drain removed 04/28/2021.  [facility]  Plan outpatient follow-up medical oncology for systemic chemotherapy             -patient may not shower             -ELOS/Goals: 7-10d 2.  Antithrombotics: -DVT/anticoagulation:  Mechanical: Antiembolism stockings, thigh (TED hose) Bilateral lower extremities.  Check vascular study             -antiplatelet therapy: N/A 3. Pain Management: Voltaren gel 4 times daily, oxycodone/Robaxin as needed 4. Mood: Ativan as needed             -antipsychotic agents: N/A 5. Neuropsych: This patient is capable of making decisions on her own behalf. 6. Skin/Wound Care: Routine skin checks 7. Fluids/Electrolytes/Nutrition: Routine in and outs with follow-up chemistries 8.  Acute blood loss anemia.  Last transfusion 11/19.  Follow-up CBC 9.  Septic shock.  Weaned from pressors 10.  Acute respiratory failure.  Extubated 11/10.  Monitor oxygen saturations. 11.  History of inappropriate sinus tachycardia.  Followed by cardiology service Dr. SGardiner Rhyme  Continue Lopressor 25 mg twice daily 12.  Anasarca.  Related to hypoalbuminemia and aggressive volume repletion.  Continue Lasix. 13.  Obesity.  BMI 39.99.  Dietary follow-up 14.  Leukocytosis. Improved 12,100 from 14,100. Follow-up CT abdomen abdomen/pelvis 12/27/2020 showed continued left retroperitoneal abscess inferior to the left kidney involving the left psoas muscle left abdominal wall overall size decreased since prior study..  There was a new fluid collection in the cul-de-sac and wrapping around the uterus concerning for abscess reviewed by gynecology services no plan for  intervention and currently maintained on empiric meropenem changed to Cipro and Flagyl 1125 2022 x 5 days.  Follow-up per general surgery Cathlyn Parsons, PA-C 05/01/2021  "I have personally performed a face to face diagnostic evaluation of this patient.  Additionally, I have reviewed and concur with the physician assistant's documentation above." Charlett Blake M.D. [facility] Fellow [employer] Diplomate Am Board of Electrodiagnostic Med Fellow Am Board of Interventional Pain

## 2021-05-01 NOTE — Evaluation (Signed)
Occupational Therapy Assessment and Plan  Patient Details  Name: Mikayla Rios MRN: [id number] Date of Birth: [birth year]  OT Diagnosis: abnormal posture, acute pain, muscle weakness (generalized), and swelling of limb Rehab Potential: Rehab Potential (ACUTE ONLY): Good ELOS: ~2 weeks   Today's Date: 05/02/2021 OT Individual Time: 0850-1002 and 7564-3329 OT Individual Time Calculation (min): 72 min and 55 min  Hospital Problem: Principal Problem:   Debility   Past Medical History:  Past Medical History:  Diagnosis Date   Allergy    seasonal   Anemia    Blood infection 1985   Blood transfusion without reported diagnosis    Diverticulitis    Hypertension    Obesity    Sleep apnea    Past Surgical History:  Past Surgical History:  Procedure Laterality Date   COLECTOMY WITH COLOSTOMY CREATION/HARTMANN PROCEDURE N/A 04/15/2021   Procedure: COLOSTOMY CREATION/HARTMANN PROCEDURE;  Surgeon: [name], MD;  Location: [facility];  Service: General;  Laterality: N/A;   IR CATHETER TUBE CHANGE  12/12/2020   IR CATHETER TUBE CHANGE  01/27/2021   IR CATHETER TUBE CHANGE  02/19/2021   IR CATHETER TUBE CHANGE  04/13/2021   IR RADIOLOGIST EVAL & MGMT  11/27/2020   IR RADIOLOGIST EVAL & MGMT  12/11/2020   IR RADIOLOGIST EVAL & MGMT  01/07/2021   IR RADIOLOGIST EVAL & MGMT  02/04/2021   IR SINUS/FIST TUBE CHK-NON GI  12/12/2020   IR SINUS/FIST TUBE CHK-NON GI  02/19/2021   LAPAROSCOPIC PARTIAL COLECTOMY N/A 04/15/2021   Procedure: LAPAROSCOPIC ASSISTED HARTMANN RESECTION;  Surgeon: [name], MD;  Location: [facility];  Service: General;  Laterality: N/A;    Assessment & Plan Clinical Impression: Mikayla Rios is a 35 year old right-handed female with history of hypertension, obesity with BMI 39.99, inappropriate sinus tachycardia followed by Dr. Gardiner Rhyme.  Per chart review patient lives with spouse.  Two-level home one half bath on main level one-step to entry.  Independent prior to  admission.  Patient recently hospitalized [facility] 5/31 until 6/6 for abdominal pain with initial work-up showing perforated diverticulitis.  She was initially admitted to the surgical service treated with intravenous antibiotics and bowel rest.  Patient had worsening leukocytosis repeat CT showed diverticular abscess amenable to drainage and IR consulted and underwent CT-guided drain placement 11/07/2020.  She was discharged to home ambulating extended distances.  Presented 04/10/2021 with progressive generalized weakness and bouts of lightheadedness.  She has had decrease in appetite and gradual weight loss.  She had been scheduled to see gastroenterology 10/6 with eventual plan to undergo endoscopic work-up to rule out any malignancy.  Upon evaluation in the emergency department underwent CT imaging of the chest abdomen pelvis.  CT angiogram of the chest showed no evidence of pulmonary emboli however CT imaging of abdomen and pelvis did reveal new enhancing fluid collection posterior to segment of inflamed colon measuring 8.5 x 3.5 x 10 cm invading the adjacent iliopsoas muscle as well extending through the left lateral abdominal wall.  Posterior drain was noted to be in middle of this abscess.  The anterior drain appeared to be completely dislodged into the subcutaneous tissue.  This did show dramatic change compared to patient's recent CT imaging of the abdomen pelvis 10/28.  Patient also found to have multiple SIRS criteria with concern for early sepsis and therefore was given 1 L bolus of lactated Ringer.  She was hypokalemic with intravenous potassium chloride initiated.  General surgery as well as interventional radiology and  gastroenterology consulted patient did undergo drainage tube exchange 04/13/2021.  She underwent laparoscopic-assisted [place] left colectomy/end colostomy mobilization of splenic flexure of colon and small bowel resection left retroperitoneal/flank abscess drainage for chronic  perforated colon with retroperitoneal abscess partial small bowel obstruction and colon obstruction 04/15/2021 per Dr. Johney Maine and pathology obtained diagnosed pT4NO stage II sigmoid colon cancer.patient did require vasopressor therapy postoperatively and weaned off pressors.  Leukocytosis 12,600 follow-up CT of abdomen 04/29/2021 showed continued left retroperitoneal abscess inferior to the left kidney overall size decreased since prior study.  New fluid collection in the cul-de-sac and wrapping around the uterus concerning for abscess reviewed by gynecology and did not feel clinically significant amount of fluid that would need intervention and patient currently is maintained on empiric meropenem changed to Cipro and Flagyl 1125 2022 x 5 days..  Wound care ongoing with colostomy in place as well as right JP drain and wound VAC.  Required mechanical ventilation postoperatively for acute respiratory failure extubated 11/10.  Bouts of prolonged QTC from hypokalemia and hypomagnesemia with supplement added.  Repeat EKG showed improvement in the QTC.  Echocardiogram with ejection fraction of 60 to 65% no wall motion abnormalities.  Dr [name] of medical oncology consulted.  Plan is for adjuvant chemotherapy FOLFOX or Capox for 3 to 6 months once recovered from latest surgery and rehabilitation.  Hospital course acute blood loss anemia transfused 2 units packed red blood cells FFP 04/19/2021 and latest hemoglobin 10.8.  Anasarca related to hypoalbuminemia and aggressive volume repletion she did receive diuresis.  She did receive TPN for nutritional support.  She is now tolerating a regular consistency diet.  Therapy evaluations completed due to patient's decreased functional mobility was admitted for a comprehensive rehab program.  Patient currently requires min-mod with basic self-care skills secondary to muscle weakness, decreased cardiorespiratoy endurance, and decreased standing balance.  Prior to May of this year,  patient could complete BADLs with independent .  Patient will benefit from skilled intervention to increase independence with basic self-care skills prior to discharge  home with family support .  Anticipate patient will require 24 hour supervision and follow up home health.  OT - End of Session Endurance Deficit: Yes Endurance Deficit Description: Pt required seated rest after standing activity throughout ADL, unable to fully complete oral care in standing due to limited standing endurance OT Assessment Rehab Potential (ACUTE ONLY): Good OT Barriers to Discharge: Lack of/limited family support;Pending chemo/radiation;Wound Care;Weight OT Barriers to Discharge Comments: ?family able to provide 24/7 supervision OT Patient demonstrates impairments in the following area(s): Balance;Skin Integrity;Edema;Endurance;Motor;Pain;Safety OT Basic ADL's Functional Problem(s): Grooming;Bathing;Dressing;Toileting OT Advanced ADL's Functional Problem(s): Simple Meal Preparation OT Transfers Functional Problem(s): Toilet OT Additional Impairment(s): None OT Plan OT Intensity: Minimum of 1-2 x/day, 45 to 90 minutes OT Frequency: 5 out of 7 days OT Duration/Estimated Length of Stay: 7-10 days OT Treatment/Interventions: Balance/vestibular training;Disease mangement/prevention;Self Care/advanced ADL retraining;Therapeutic Exercise;DME/adaptive equipment instruction;Pain management;Skin care/wound managment;UE/LE Strength taining/ROM;UE/LE Coordination activities;Patient/family education;Community reintegration;Functional mobility training;Discharge planning;Psychosocial support;Therapeutic Activities OT Self Feeding Anticipated Outcome(s): No goal OT Basic Self-Care Anticipated Outcome(s): Supervision OT Toileting Anticipated Outcome(s): Supervision OT Bathroom Transfers Anticipated Outcome(s): Supervision OT Recommendation Recommendations for Other Services: Neuropsych consult Patient destination:  Home Follow Up Recommendations: Home health OT Equipment Recommended: To be determined   OT Evaluation Precautions/Restrictions  Precautions Precautions: Fall Precaution Comments: Ostomy, wound vac Restrictions Weight Bearing Restrictions: No Pain Pain Assessment Pain Scale: 0-10 Pain Score: 4  Pain Type: Acute pain;Surgical pain Pain Location: Abdomen  Pain Orientation: Left;Mid;Lower;Right;Anterior Pain Descriptors / Indicators: Aching;Sore Pain Onset: On-going Pain Intervention(s): Rest;Relaxation;Distraction;Emotional support;RN made aware Home Living/Prior [place] expects to be discharged to:: Private residence Living Arrangements: Spouse/significant other, Children Available Help at Discharge: Family, Available PRN/intermittently, Available 24 hours/day (between spouse + pts parents) Type of Home: House Home Access: Stairs to enter [employer] of Steps: 1 Home Layout: Two level, 1/2 bath on main level Alternate Level Stairs-Number of Steps: 14 Alternate Level Stairs-Rails: Right Bathroom Shower/Tub: Chiropodist: Standard  Lives With: Spouse, Family (spouse and 3 children, 6 y/o, 48 y/o, and 81 y/o, oldest son has autism) IADL History Homemaking Responsibilities: Yes (pt reported being fully independent before May of this year, cooking, doing housekeeping/laundry tasks, also home-schooling 2 of her children) Occupation: Full time employment Type of Occupation: Freight forwarder for [employer] and Hobbies: Playing sports with her children on the weekends Prior Function Level of Independence: Independent with homemaking with ambulation, Independent with basic ADLs  Able to Take Stairs?: Yes (last time went up stairs was Oct) Driving: Yes Vocation: Other (Comment) Vocation Requirements: full time mom Vision Baseline Vision/History: 0 No visual deficits Ability to See in Adequate Light: 0 Adequate Patient  Visual Report: No change from baseline Perception  Perception: Within Functional Limits Praxis Praxis: Intact Cognition Overall Cognitive Status: Within Functional Limits for tasks assessed Arousal/Alertness: Awake/alert Orientation Level: Situation;Person;Place Person: Oriented Place: Oriented Situation: Oriented Year: 2022 Month: November Day of Week: Correct Memory: Appears intact Immediate Memory Recall: Sock;Blue;Bed Memory Recall Sock: Without Cue Memory Recall Blue: Without Cue Memory Recall Bed: Without Cue Attention: Focused;Sustained;Selective Focused Attention: Appears intact Sustained Attention: Appears intact Selective Attention: Appears intact Awareness: Appears intact Safety/Judgment: Appears intact Sensation Sensation Light Touch: Appears Intact Hot/Cold: Not tested Proprioception: Appears Intact Stereognosis: Not tested Coordination Gross Motor Movements are Fluid and Coordinated: No Fine Motor Movements are Fluid and Coordinated: No Coordination and Movement Description: Gross motor affected by body habitus and swelling, mild shakiness in both hands Lt>Rt Finger Nose Finger Test: Mild shakiness, Lt>Rt, pt attributes this typically to low sugar Motor  Motor Motor: Abnormal postural alignment and control  Trunk/Postural Assessment  Cervical Assessment Cervical Assessment: Exceptions to [facility] (mild forward head) Thoracic Assessment Thoracic Assessment: Exceptions to [facility] (rounded shoulders) Lumbar Assessment Lumbar Assessment: Exceptions to [facility] (posterior pelvic tilt) Postural Control Postural Control: Deficits on evaluation (limited in standing, pt needing an AD for standing support during functional activity)  Balance Balance Balance Assessed: Yes (Min A dynamic sitting balance (doffing gripper socks), Min A dynamic standing balance (perihygiene completion during bathing)) Standardized Balance Assessment Standardized Balance Assessment: Timed Up and  Go Test Timed Up and Go Test TUG: Normal TUG Normal TUG (seconds):  (unable to do as pt unable to come to standing without assistance) Static Sitting Balance Static Sitting - Balance Support: Feet supported Static Sitting - Level of Assistance: 5: Stand by assistance Dynamic Sitting Balance Dynamic Sitting - Balance Support: Feet supported Dynamic Sitting - Level of Assistance: 5: Stand by assistance;Other (comment) (CGA) Static Standing Balance Static Standing - Balance Support: During functional activity;Bilateral upper extremity supported Static Standing - Level of Assistance: Other (comment) (CGA) Dynamic Standing Balance Dynamic Standing - Balance Support: During functional activity;Bilateral upper extremity supported Dynamic Standing - Level of Assistance: 4: Min assist;3: Mod assist Extremity/Trunk Assessment RUE Assessment RUE Assessment: Within Functional Limits (mild swelling which pt reports has mostly resolved) Active Range of Motion (AROM) Comments: WNL LUE Assessment LUE Assessment: Within  Functional Limits (mild swelling which pt reports has mostly resolved) Active Range of Motion (AROM) Comments: WNL  Care Tool Care Tool Self Care Eating    Not assessed    Oral Care    Oral Care Assist Level: Contact Guard/Toucning assist (standing at the sink)    Bathing   Body parts bathed by patient: Right arm;Left arm;Chest;Abdomen;Front perineal area;Buttocks;Right upper leg;Left upper leg;Face Body parts bathed by helper: Right lower leg;Left lower leg   Assist Level: Minimal Assistance - Patient > 75%    Upper Body Dressing(including orthotics)   What is the patient wearing?: Dress   Assist Level: Minimal Assistance - Patient > 75%    Lower Body Dressing (excluding footwear)   What is the patient wearing?: Underwear/pull up Assist for lower body dressing: Minimal Assistance - Patient > 75%    Putting on/Taking off footwear   What is the patient wearing?: Non-skid  slipper socks;Ted hose Assist for footwear: Maximal Assistance - Patient 25 - 49%       Care Tool Toileting Toileting activity   Assist for toileting: Minimal Assistance - Patient > 75%       Toilet transfer   Assist Level: Moderate Assistance - Patient 50 - 74%     Care Tool Cognition  Expression of Ideas and Wants Expression of Ideas and Wants: 4. Without difficulty (complex and basic) - expresses complex messages without difficulty and with speech that is clear and easy to understand  Understanding Verbal and Non-Verbal Content Understanding Verbal and Non-Verbal Content: 4. Understands (complex and basic) - clear comprehension without cues or repetitions   Memory/Recall Ability Memory/Recall Ability : Current season;Location of own room;Staff names and faces   Refer to Care Plan for Long Term Goals  SHORT TERM GOAL WEEK 1 OT Short Term Goal 1 (Week 1): Pt will complete toileting with Min A sit<stands Pt will complete LB dressing with Min A using AE as needed Pt will complete oral care while standing at the sink without rest to increase standing endurance  Recommendations for other services: Neuropsych   Skilled Therapeutic Intervention Skilled OT session completed with focus on initial evaluation, education on OT role/POC, and establishment of patient-centered goals.   Pt greeted in bed, mild abdominal pain reported from wound vac site however manageable with rest breaks/repositioning during tx. She engaged in bathing/dressing tasks from EOB sit<stand using RW. Mod A to transition to EOB due to Rt PICC line being removed yesterday and pt told to not bear too much weight through Rt UE. Mod A for sit<stands and Min balance assistance in standing during LB self care. Pt required assistance to functionally reach her feet. Donned overhead dress with Min A. She then used the RW to ambulate into the bathroom to void bladder. Mod A for power up and CGA for ambulatory transfer. Pt  completed 3/3 components of toileting with overall Min A due to her needing assistance to rise into standing. Able to complete handwashing and first part of oral care in standing but ultimately needed to sit to finish due to fatigue. Pt returned to bed at close of session, left with all needs within reach and bed alarm set.   2nd Session 1:1 tx (55 min) Pt greeted in bed, finishing up ostomy care with RN and spouse. Pt was agreeable to session. Started with ACE wrapping B LEs due to poor fit of Ted stockings. Education provided on using the sock aide to don gripper socks with pt able to return  carryover of education with supervision. Energy conservation/work simplification handout printed for pt and we reviewed functional implications during ADL/IADL at home also taking into consideration chemo schedule. Mod A for sit<stand and CGA for ambulation into the bathroom using RW. Pt completed 3/3 components of toileting with Min A overall, pt with +bladder void. After handwashing at the sink, pt returned to the bed with Mod A. She remained in bed at close of session, all needs within reach and bed alarm set. Tx focus placed on pt/caregiver education, functional transfers, adaptive self care skills, and balance.   ADL ADL Eating: Not assessed Grooming: Contact guard (able to complete a portion of oral care in standing, needed to finish in sitting due to decreased standing endurance) Where Assessed-Grooming: Standing at sink Upper Body Bathing: Supervision/safety Where Assessed-Upper Body Bathing: Edge of bed Lower Body Bathing: Minimal assistance Where Assessed-Lower Body Bathing: Edge of bed Upper Body Dressing: Minimal assistance Where Assessed-Upper Body Dressing: Edge of bed Lower Body Dressing: Moderate assistance Where Assessed-Lower Body Dressing: Edge of bed Toileting: Minimal assistance Where Assessed-Toileting: Glass blower/designer: Moderate assistance Toilet Transfer Method: Ambulating  (RW) Toilet Transfer Equipment: Raised toilet seat Tub/Shower Transfer: Not assessed Mobility   Mod A sit<stands   Discharge Criteria: Patient will be discharged from OT if patient refuses treatment 3 consecutive times without medical reason, if treatment goals not met, if there is a change in medical status, if patient makes no progress towards goals or if patient is discharged from hospital.  The above assessment, treatment plan, treatment alternatives and goals were discussed and mutually agreed upon: by patient  Skeet Simmer 05/02/2021, 11:44 AM

## 2021-05-01 NOTE — Progress Notes (Signed)
VAST consult to remove alteplase from red and gray lumens. Unable to remove TPA from either lumen,  but both lumens flushed easily. Pt reported her line has been positional and usually works best when positioned close to her body. Attempted this position which made line even easier to flush, but still unable to obtain blood return from either lumen.  Reached out to Dr. Erlinda Hong regarding issues. She verbalized "can discontinue picc line as she is to d/c to cir today on oral abx".  Transport arrived prior to VAST RN being able to remove PICC. Patient to be transferred to [place]. VAST RN will pass information along to VAST on [employer]

## 2021-05-02 ENCOUNTER — Encounter (HOSPITAL_COMMUNITY): Payer: BC Managed Care – PPO

## 2021-05-02 MED ORDER — DIPHENHYDRAMINE HCL 25 MG PO CAPS
25.0000 mg | ORAL_CAPSULE | Freq: Four times a day (QID) | ORAL | Status: DC | PRN
  Administered 2021-05-02 – 2021-05-12 (×7): 25 mg via ORAL
  Filled 2021-05-02 (×9): qty 1

## 2021-05-02 NOTE — Plan of Care (Signed)
  Problem: RH Balance Goal: LTG Patient will maintain dynamic standing with ADLs (OT) Description: LTG:  Patient will maintain dynamic standing balance with assist during activities of daily living (OT)  Flowsheets (Taken 05/02/2021 1128) LTG: Pt will maintain dynamic standing balance during ADLs with: Supervision/Verbal cueing   Problem: Sit to Stand Goal: LTG:  Patient will perform sit to stand in prep for activites of daily living with assistance level (OT) Description: LTG:  Patient will perform sit to stand in prep for activites of daily living with assistance level (OT) Flowsheets (Taken 05/02/2021 1128) LTG: PT will perform sit to stand in prep for activites of daily living with assistance level: Supervision/Verbal cueing   Problem: RH Grooming Goal: LTG Patient will perform grooming w/assist,cues/equip (OT) Description: LTG: Patient will perform grooming with assist, with/without cues using equipment (OT) Flowsheets (Taken 05/02/2021 1128) LTG: Pt will perform grooming with assistance level of: Set up assist    Problem: RH Bathing Goal: LTG Patient will bathe all body parts with assist levels (OT) Description: LTG: Patient will bathe all body parts with assist levels (OT) Flowsheets (Taken 05/02/2021 1128) LTG: Pt will perform bathing with assistance level/cueing: Supervision/Verbal cueing   Problem: RH Dressing Goal: LTG Patient will perform upper body dressing (OT) Description: LTG Patient will perform upper body dressing with assist, with/without cues (OT). Flowsheets (Taken 05/02/2021 1128) LTG: Pt will perform upper body dressing with assistance level of: Set up assist Goal: LTG Patient will perform lower body dressing w/assist (OT) Description: LTG: Patient will perform lower body dressing with assist, with/without cues in positioning using equipment (OT) Flowsheets (Taken 05/02/2021 1128) LTG: Pt will perform lower body dressing with assistance level of: (excluding  Teds)  Supervision/Verbal cueing  Other (Comment)   Problem: RH Toileting Goal: LTG Patient will perform toileting task (3/3 steps) with assistance level (OT) Description: LTG: Patient will perform toileting task (3/3 steps) with assistance level (OT)  Flowsheets (Taken 05/02/2021 1128) LTG: Pt will perform toileting task (3/3 steps) with assistance level: Supervision/Verbal cueing   Problem: RH Toilet Transfers Goal: LTG Patient will perform toilet transfers w/assist (OT) Description: LTG: Patient will perform toilet transfers with assist, with/without cues using equipment (OT) Flowsheets (Taken 05/02/2021 1128) LTG: Pt will perform toilet transfers with assistance level of: Supervision/Verbal cueing

## 2021-05-02 NOTE — Plan of Care (Signed)
  Problem: RH Balance Goal: LTG Patient will maintain dynamic sitting balance (PT) Description: LTG:  Patient will maintain dynamic sitting balance with assistance during mobility activities (PT) Flowsheets (Taken 05/02/2021 1301) LTG: Pt will maintain dynamic sitting balance during mobility activities with:: Independent with assistive device  Goal: LTG Patient will maintain dynamic standing balance (PT) Description: LTG:  Patient will maintain dynamic standing balance with assistance during mobility activities (PT) Flowsheets (Taken 05/02/2021 1301) LTG: Pt will maintain dynamic standing balance during mobility activities with:: Contact Guard/Touching assist   Problem: Sit to Stand Goal: LTG:  Patient will perform sit to stand with assistance level (PT) Description: LTG:  Patient will perform sit to stand with assistance level (PT) Flowsheets (Taken 05/02/2021 1301) LTG: PT will perform sit to stand in preparation for functional mobility with assistance level: Supervision/Verbal cueing   Problem: RH Bed Mobility Goal: LTG Patient will perform bed mobility with assist (PT) Description: LTG: Patient will perform bed mobility with assistance, with/without cues (PT). Flowsheets (Taken 05/02/2021 1301) LTG: Pt will perform bed mobility with assistance level of: Supervision/Verbal cueing   Problem: RH Bed to Chair Transfers Goal: LTG Patient will perform bed/chair transfers w/assist (PT) Description: LTG: Patient will perform bed to chair transfers with assistance (PT). Flowsheets (Taken 05/02/2021 1301) LTG: Pt will perform Bed to Chair Transfers with assistance level: Supervision/Verbal cueing   Problem: RH Car Transfers Goal: LTG Patient will perform car transfers with assist (PT) Description: LTG: Patient will perform car transfers with assistance (PT). Flowsheets (Taken 05/02/2021 1301) LTG: Pt will perform car transfers with assist:: Supervision/Verbal cueing   Problem: RH  Ambulation Goal: LTG Patient will ambulate in controlled environment (PT) Description: LTG: Patient will ambulate in a controlled environment, # of feet with assistance (PT). Flowsheets (Taken 05/02/2021 1301) LTG: Pt will ambulate in controlled environ  assist needed:: Supervision/Verbal cueing LTG: Ambulation distance in controlled environment: 50ft using LRAD Goal: LTG Patient will ambulate in home environment (PT) Description: LTG: Patient will ambulate in home environment, # of feet with assistance (PT). Flowsheets (Taken 05/02/2021 1301) LTG: Pt will ambulate in home environ  assist needed:: Supervision/Verbal cueing LTG: Ambulation distance in home environment: 177ft using LRAD   Problem: RH Stairs Goal: LTG Patient will ambulate up and down stairs w/assist (PT) Description: LTG: Patient will ambulate up and down # of stairs with assistance (PT) Flowsheets (Taken 05/02/2021 1301) LTG: Pt will ambulate up/down stairs assist needed:: Contact Guard/Touching assist LTG: Pt will  ambulate up and down number of stairs: 12 steps using HRs per home set-up

## 2021-05-02 NOTE — Evaluation (Addendum)
Physical Therapy Assessment and Plan  Patient Details  Name: Mikayla Rios MRN: [id number] Date of Birth: 07-10-1985  PT Diagnosis: Abnormal posture, Abnormality of gait, Difficulty walking, Muscle weakness, and Pain in abdomen Rehab Potential: Good ELOS: ~2 weeks   Today's Date: 05/02/2021 PT Individual Time: 9381-8299 PT Individual Time Calculation (min): 70 min    Hospital Problem: Principal Problem:   Debility   Past Medical History:  Past Medical History:  Diagnosis Date   Allergy    seasonal   Anemia    Blood infection 1985   Blood transfusion without reported diagnosis    Diverticulitis    Hypertension    Obesity    Sleep apnea    Past Surgical History:  Past Surgical History:  Procedure Laterality Date   COLECTOMY WITH COLOSTOMY CREATION/HARTMANN PROCEDURE N/A 04/15/2021   Procedure: COLOSTOMY CREATION/HARTMANN PROCEDURE;  Surgeon: [name], MD;  Location: [facility];  Service: General;  Laterality: N/A;   IR CATHETER TUBE CHANGE  12/12/2020   IR CATHETER TUBE CHANGE  01/27/2021   IR CATHETER TUBE CHANGE  02/19/2021   IR CATHETER TUBE CHANGE  04/13/2021   IR RADIOLOGIST EVAL & MGMT  11/27/2020   IR RADIOLOGIST EVAL & MGMT  12/11/2020   IR RADIOLOGIST EVAL & MGMT  01/07/2021   IR RADIOLOGIST EVAL & MGMT  02/04/2021   IR SINUS/FIST TUBE CHK-NON GI  12/12/2020   IR SINUS/FIST TUBE CHK-NON GI  02/19/2021   LAPAROSCOPIC PARTIAL COLECTOMY N/A 04/15/2021   Procedure: LAPAROSCOPIC ASSISTED HARTMANN RESECTION;  Surgeon: [name], MD;  Location: [facility];  Service: General;  Laterality: N/A;    Assessment & Plan Clinical Impression: Patient is a 35 y.o.  right-handed female with history of hypertension, obesity with BMI 39.99, inappropriate sinus tachycardia followed by Dr. Gardiner Rhyme.  Per chart review patient lives with spouse.  Two-level home one half bath on main level one-step to entry.  Independent prior to admission.  Patient recently hospitalized [facility] 5/31 until 6/6 for abdominal pain with initial work-up showing perforated diverticulitis.  She was initially admitted to the surgical service treated with intravenous antibiotics and bowel rest.  Patient had worsening leukocytosis repeat CT showed diverticular abscess amenable to drainage and IR consulted and underwent CT-guided drain placement 11/07/2020.  She was discharged to home ambulating extended distances.  Presented 04/10/2021 with progressive generalized weakness and bouts of lightheadedness.  She has had decrease in appetite and gradual weight loss.  She had been scheduled to see gastroenterology 10/6 with eventual plan to undergo endoscopic work-up to rule out any malignancy.  Upon evaluation in the emergency department underwent CT imaging of the chest abdomen pelvis.  CT angiogram of the chest showed no evidence of pulmonary emboli however CT imaging of abdomen and pelvis did reveal new enhancing fluid collection posterior to segment of inflamed colon measuring 8.5 x 3.5 x 10 cm invading the adjacent iliopsoas muscle as well extending through the left lateral abdominal wall.  Posterior drain was noted to be in middle of this abscess.  The anterior drain appeared to be completely dislodged into the subcutaneous tissue.  This did show dramatic change compared to patient's recent CT imaging of the abdomen pelvis 10/28.  Patient also found to have multiple SIRS criteria with concern for early sepsis and therefore was given 1 L bolus of lactated Ringer.  She was hypokalemic with intravenous potassium chloride initiated.  General surgery as well as interventional radiology and gastroenterology consulted patient did undergo  drainage tube exchange 04/13/2021.  She underwent laparoscopic-assisted [facility] left colectomy/end colostomy mobilization of splenic flexure of colon and small bowel resection left retroperitoneal/flank abscess drainage for chronic perforated colon with retroperitoneal abscess partial  small bowel obstruction and colon obstruction 04/15/2021 per Dr. Johney Maine and pathology obtained diagnosed pT4NO stage II sigmoid colon cancer.patient did require vasopressor therapy postoperatively and weaned off pressors.  Leukocytosis 12,600 follow-up CT of abdomen 04/29/2021 showed continued left retroperitoneal abscess inferior to the left kidney overall size decreased since prior study.  New fluid collection in the cul-de-sac and wrapping around the uterus concerning for abscess reviewed by gynecology and did not feel clinically significant amount of fluid that would need intervention and patient currently is maintained on empiric meropenem changed to Cipro and Flagyl 1125 2022 x 5 days..  Wound care ongoing with colostomy in place as well as right JP drain and wound VAC.  Required mechanical ventilation postoperatively for acute respiratory failure extubated 11/10.  Bouts of prolonged QTC from hypokalemia and hypomagnesemia with supplement added.  Repeat EKG showed improvement in the QTC.  Echocardiogram with ejection fraction of 60 to 65% no wall motion abnormalities.  Dr [name] of medical oncology consulted.  Plan is for adjuvant chemotherapy FOLFOX or Capox for 3 to 6 months once recovered from latest surgery and rehabilitation.  Hospital course acute blood loss anemia transfused 2 units packed red blood cells FFP 04/19/2021 and latest hemoglobin 10.8.  Anasarca related to hypoalbuminemia and aggressive volume repletion she did receive diuresis.  She did receive TPN for nutritional support.  She is now tolerating a regular consistency diet.  Therapy evaluations completed due to patient's decreased functional mobility was admitted for a comprehensive rehab program.  Patient transferred to CIR on 05/01/2021 .   Patient currently requires mod assist with mobility secondary to muscle weakness, decreased cardiorespiratoy endurance, unbalanced muscle activation, and decreased standing balance and decreased  balance strategies.  Prior to hospitalization, patient was independent  with mobility and lived with Spouse, Family (spouse and 3 children, 14 y/o, 12 y/o, and 6 y/o, oldest son has autism) in a House home.  Home access is 1Stairs to enter.  Patient will benefit from skilled PT intervention to maximize safe functional mobility, minimize fall risk, and decrease caregiver burden for planned discharge home with 24 hour supervision.  Anticipate patient will benefit from follow up OP at discharge.  PT - End of Session Activity Tolerance: Tolerates 30+ min activity with multiple rests Endurance Deficit: Yes Endurance Deficit Description: Requires seated rest break after each functional mobility task with pt also subjectively reporting poor endurance PT Assessment Rehab Potential (ACUTE/IP ONLY): Good PT Barriers to Discharge: Home environment access/layout;Inaccessible home environment;Pending chemo/radiation;Wound Care PT Patient demonstrates impairments in the following area(s): Balance;Perception;Behavior;Safety;Edema;Sensory;Endurance;Skin Integrity;Motor;Nutrition;Pain PT Transfers Functional Problem(s): Bed Mobility;Bed to Chair;Car;Furniture PT Locomotion Functional Problem(s): Ambulation;Stairs PT Plan PT Intensity: Minimum of 1-2 x/day ,45 to 90 minutes PT Frequency: 5 out of 7 days PT Duration Estimated Length of Stay: ~2 weeks PT Treatment/Interventions: Ambulation/gait training;Community reintegration;DME/adaptive equipment instruction;Neuromuscular re-education;Psychosocial support;Stair training;UE/LE Strength taining/ROM;Balance/vestibular training;Discharge planning;Functional electrical stimulation;Pain management;Skin care/wound management;Therapeutic Activities;UE/LE Coordination activities;Cognitive remediation/compensation;Disease management/prevention;Functional mobility training;Patient/family education;Splinting/orthotics;Therapeutic Exercise;Visual/perceptual  remediation/compensation PT Transfers Anticipated Outcome(s): supervision using LRAD PT Locomotion Anticipated Outcome(s): supervision using LRAD PT Recommendation Recommendations for Other Services: Neuropsych consult;Therapeutic Recreation consult Therapeutic Recreation Interventions: Stress management;Outing/community reintergration Follow Up Recommendations: Outpatient PT;24 hour supervision/assistance Patient destination: Home Equipment Recommended: To be determined   PT Evaluation Precautions/Restrictions Precautions Precautions: Fall;Other (comment) Precaution  Comments: colostomy, wound vac Restrictions Weight Bearing Restrictions: No Pain Pain Assessment Pain Scale: 0-10 Pain Score: 4  Pain Type: Acute pain;Surgical pain Pain Location: Abdomen Pain Orientation: Left;Mid;Lower;Right;Anterior Pain Descriptors / Indicators: Aching;Sore Pain Onset: On-going Pain Intervention(s): Rest;Relaxation;Distraction;Emotional support;RN made aware Pain Interference Pain Interference Pain Effect on Sleep: 1. Rarely or not at all Pain Interference with Therapy Activities: 1. Rarely or not at all Pain Interference with Day-to-Day Activities: 1. Rarely or not at all Home Living/Prior [place] Available Help at Discharge: Family;Available PRN/intermittently (husband works full time and pt's parents are in the area and are retired) Type of Home: House Home Access: Stairs to enter Technical brewer of Steps: [place]: Two level;1/2 bath on main level Alternate Level Stairs-Number of Steps: 14 Alternate Level Stairs-Rails: Right  Lives With: Spouse;Family (husband, Roderick Pee, and 3 kids (15, 56, & 18y.o. 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 Vision/Perception  Vision - History Ability to See in Adequate Light: 0 Adequate Perception Perception: Within Functional Limits Praxis Praxis: Intact  Cognition Overall Cognitive Status: Within Functional Limits for tasks assessed Arousal/Alertness: Awake/alert Orientation Level: Oriented X4 Year: 2022 Month: November Day of Week: Correct Attention: Focused;Sustained;Selective Focused Attention: Appears intact Sustained Attention: Appears intact Selective Attention: Appears intact Memory: Appears intact Awareness: Appears intact Safety/Judgment: Appears intact Sensation Sensation Light Touch: Appears Intact Hot/Cold: Not tested Proprioception: Appears Intact Stereognosis: Not tested Coordination Gross Motor Movements are Fluid and Coordinated: No Fine Motor Movements are Fluid and Coordinated: No Coordination and Movement Description: generalized weakness and deconditioning Finger Nose Finger Test: Mild shakiness, Lt>Rt, pt attributes this typically to low sugar Motor  Motor Motor: Abnormal postural alignment and control;Other (comment) Motor - Skilled Clinical Observations: generalized weakness and deconditioning   Trunk/Postural Assessment  Cervical Assessment Cervical Assessment: Exceptions to [facility] (mild forward head) Thoracic Assessment Thoracic Assessment: Exceptions to [facility] (rounded shoulders with pt in guarded positioning due to pain) Lumbar Assessment Lumbar Assessment: Exceptions to [facility] (posterior pelvic tilt in sitting) Postural Control Postural Control: Deficits on evaluation Postural Limitations: decreased with pt relying on B UE support at this time  Balance Balance Balance Assessed: Yes Standardized Balance Assessment Standardized Balance Assessment: Timed Up and Go Test Timed Up and Go Test TUG: Normal TUG Normal TUG (seconds):  (unable to do as pt unable to  come to standing without assistance) Static Sitting Balance Static Sitting - Balance Support: Feet supported Static Sitting - Level of Assistance: 5: Stand by assistance Dynamic Sitting Balance Dynamic Sitting - Balance Support: Feet supported Dynamic Sitting - Level of Assistance: 5: Stand by assistance;Other (comment) (CGA) Static Standing Balance Static Standing - Balance Support: During functional activity;Bilateral upper extremity supported Static Standing - Level of Assistance: Other (comment) (CGA) Dynamic Standing Balance Dynamic Standing - Balance Support: During functional activity;Bilateral upper extremity supported Dynamic Standing - Level of Assistance: 4: Min assist;3: Mod assist Extremity Assessment      RLE Assessment RLE Assessment: Exceptions to [facility] Active Range of Motion (AROM) Comments: WFL RLE Strength Right Hip Flexion: 3+/5 Right Hip Extension: 3+/5 Right Knee Flexion: 3+/5 Right Knee Extension: 4-/5 Right Ankle Dorsiflexion: 3+/5 Right Ankle Plantar Flexion: 3+/5 LLE Assessment LLE Assessment: Exceptions to [facility] Active Range of Motion (  AROM) Comments: WFL LLE Strength Left Hip Flexion: 3+/5 Left Hip Extension: 3+/5 Left Knee Flexion: 3+/5 Left Knee Extension: 3+/5 Left Ankle Dorsiflexion: 3+/5 Left Ankle Plantar Flexion: 3+/5  Care Tool Care Tool Bed Mobility Roll left and right activity   Roll left and right assist level: Moderate Assistance - Patient 50 - 74%    Sit to lying activity   Sit to lying assist level: Moderate Assistance - Patient 50 - 74%    Lying to sitting on side of bed activity   Lying to sitting on side of bed assist level: the ability to move from lying on the back to sitting on the side of the bed with no back support.: Moderate Assistance - Patient 50 - 74%     Care Tool Transfers Sit to stand transfer   Sit to stand assist level: Moderate Assistance - Patient 50 - 74%    Chair/bed transfer   Chair/bed transfer assist  level: Moderate Assistance - Patient 50 - 74%     Psychologist, counselling transfer activity did not occur: Safety/medical concerns        Care Tool Locomotion Ambulation Ambulation activity did not occur: Safety/medical concerns (requires use of RW, which pt did not use at baseline and pt reports feeling unsafe to attempt gait without RW today)        Walk 10 feet activity Walk 10 feet activity did not occur: Safety/medical concerns       Walk 50 feet with 2 turns activity Walk 50 feet with 2 turns activity did not occur: Safety/medical concerns      Walk 150 feet activity Walk 150 feet activity did not occur: Safety/medical concerns      Walk 10 feet on uneven surfaces activity Walk 10 feet on uneven surfaces activity did not occur: Safety/medical concerns      Stairs Stair activity did not occur: Safety/medical concerns        Walk up/down 1 step activity Walk up/down 1 step or curb (drop down) activity did not occur: Safety/medical concerns      Walk up/down 4 steps activity Walk up/down 4 steps activity did not occur: Safety/medical concerns      Walk up/down 12 steps activity Walk up/down 12 steps activity did not occur: Safety/medical concerns      Pick up small objects from floor   Pick up small object from the floor assist level: Maximal Assistance - Patient 25 - 49%    Wheelchair Is the patient using a wheelchair?: Yes Type of Wheelchair: Manual   Wheelchair assist level: Dependent - Patient 0%    Wheel 50 feet with 2 turns activity   Assist Level: Dependent - Patient 0%  Wheel 150 feet activity   Assist Level: Dependent - Patient 0%    Refer to Care Plan for Long Term Goals  SHORT TERM GOAL WEEK 1 PT Short Term Goal 1 (Week 1): Pt will perform supine<>sit with min assist PT Short Term Goal 2 (Week 1): Pt will perform sit<>stands using LRAD with min assist PT Short Term Goal 3 (Week 1): Pt will perform stand pivot transfers using  LRAD with min assist (including come to stand) PT Short Term Goal 4 (Week 1): Pt will ambulate at least 135f using LRAD with CGA PT Short Term Goal 5 (Week 1): Pt will ascend/descend 4 steps using B HRs with mod assist  Recommendations for other services: Neuropsych and Therapeutic Recreation  Stress management and Outing/community reintegration  Skilled Therapeutic Intervention Pt received supine in bed and agreeable to therapy session. Evaluation completed (see details above) with patient education regarding purpose of PT evaluation, PT POC and goals, therapy schedule, weekly team meetings, and other CIR information including safety plan and fall risk safety. Pt performed the below mobility tasks with the specified levels of assistance. Pt demonstrates significant functional weakness in B LEs requiring mod assist to power up into standing. Pt able to progress to performing repeated step up/down on/off 1st 3" height step using B HRs for support with min assist for lifting onto step and pt relying heavily on B UE support - x3 reps R LE and x2 reps L LE. At end of session, pt left seated in w/c with needs in reach.  Five times Sit to Stand Test (FTSS) Method: Use a straight back chair with a solid seat that is 16-18" high. Ask participant to sit on the chair with arms folded across their chest.   Instructions: "Stand up and sit down as quickly as possible 5 times, keeping your arms folded across your chest."   Measurement: Stop timing when the participant stands the 5th time.  TIME: N/A (in seconds) - pt unable to come to stand without assist and without UE support  Times > 13.6 seconds is associated with increased disability and morbidity (Guralnik, 2000) Times > 15 seconds is predictive of recurrent falls in healthy individuals aged 94 and older (Buatois, et al., 2008) Normal performance values in community dwelling individuals aged 26 and older (Bohannon, 2006): 60-69 years: 11.4  seconds 70-79 years: 12.6 seconds 80-89 years: 14.8 seconds  MCID: ? 2.3 seconds for Vestibular Disorders (Meretta, 2006)  Mobility Bed Mobility Bed Mobility: Sit to Supine;Supine to Sit Supine to Sit: Moderate Assistance - Patient 50-74% (using bed features) Sit to Supine: Moderate Assistance - Patient 50-74% (using bed features) Transfers Transfers: Sit to Stand;Stand to Sit;Stand Pivot Transfers Sit to Stand: Moderate Assistance - Patient 50-74% Stand to Sit: Minimal Assistance - Patient > 75% Stand Pivot Transfers: Moderate Assistance - Patient 50 - 74%;Minimal Assistance - Patient > 75% Stand Pivot Transfer Details: Verbal cues for technique;Verbal cues for gait pattern;Verbal cues for safe use of DME/AE;Tactile cues for sequencing;Tactile cues for weight shifting;Manual facilitation for weight shifting;Verbal cues for sequencing Transfer (Assistive device): Rolling walker Locomotion  Gait Ambulation: Yes Gait Assistance: Minimal Assistance - Patient > 75% Gait Distance (Feet): 74 Feet Assistive device: Rolling walker Gait Assistance Details: Tactile cues for posture;Verbal cues for gait pattern;Verbal cues for technique;Verbal cues for precautions/safety;Verbal cues for safe use of DME/AE;Tactile cues for sequencing Gait Gait: Yes Gait Pattern: Impaired Gait Pattern: Decreased step length - left;Step-through pattern Gait velocity: decreased Stairs / Additional Locomotion Stairs: Yes Stairs Assistance: Minimal Assistance - Patient > 75% Stair Management Technique: Two rails;Step to pattern;Forwards;Backwards (stepped up onto 1, 3" height step then stepped backwards off of it using B HRs) Number of Stairs: 1 Height of Stairs: 3 Wheelchair Mobility Wheelchair Mobility: No   Discharge Criteria: Patient will be discharged from PT if patient refuses treatment 3 consecutive times without medical reason, if treatment goals not met, if there is a change in medical status, if  patient makes no progress towards goals or if patient is discharged from hospital.  The above assessment, treatment plan, treatment alternatives and goals were discussed and mutually agreed upon: by patient  Tawana Scale , PT, DPT, NCS, CSRS  05/02/2021, 7:59 AM

## 2021-05-02 NOTE — Progress Notes (Signed)
PROGRESS NOTE   Subjective/Complaints: She has no complaints today Reviewed dressing orders K+ was 3.2 yesterday, will repeat tomorrow WBC stable yesterday  ROS: +pain   Objective:   No results found. Recent Labs    05/01/21 0627  WBC 12.1*  HGB 10.8*  HCT 34.4*  PLT 790*   Recent Labs    05/01/21 0627  NA 139  K 3.2*  CL 101  CO2 28  GLUCOSE 97  BUN 20  CREATININE 0.56  CALCIUM 9.0    Intake/Output Summary (Last 24 hours) at 05/02/2021 1701 Last data filed at 05/02/2021 1300 Gross per 24 hour  Intake 558 ml  Output --  Net 558 ml        Physical Exam: Vital Signs Blood pressure 135/83, pulse (!) 108, temperature 98 F (36.7 C), resp. rate 18, last menstrual period 04/10/2021, SpO2 100 %. Gen: no distress, normal appearing HEENT: oral mucosa pink and moist, NCAT Cardio: Tachycardic Chest: normal effort, normal rate of breathing Abdominal:     Comments: Colostomy in place and functioning.  Right JP drain in place.  Wound VAC in place.  Ext: no edema Psych: pleasant, normal affect Skin: intact Neurologic: motor strength is 5/5 in bilateral deltoid, bicep, tricep, grip,4- hip flexor, knee extensors, ankle dorsiflexor and plantar flexor Sensory exam normal sensation to light touch  in bilateral upper and lower extremities   Musculoskeletal: Full range of motion in all 4 extremities. No joint swelling   Assessment/Plan: 1. Functional deficits which require 3+ hours per day of interdisciplinary therapy in a comprehensive inpatient rehab setting. Physiatrist is providing close team supervision and 24 hour management of active medical problems listed below. Physiatrist and rehab team continue to assess barriers to discharge/monitor patient progress toward functional and medical goals  Care Tool:  Bathing    Body parts bathed by patient: Right arm, Left arm, Chest, Abdomen, Front perineal area,  Buttocks, Right upper leg, Left upper leg, Face   Body parts bathed by helper: Right lower leg, Left lower leg     Bathing assist Assist Level: Minimal Assistance - Patient > 75%     Upper Body Dressing/Undressing Upper body dressing   What is the patient wearing?: Dress    Upper body assist Assist Level: Minimal Assistance - Patient > 75%    Lower Body Dressing/Undressing Lower body dressing      What is the patient wearing?: Underwear/pull up     Lower body assist Assist for lower body dressing: Minimal Assistance - Patient > 75%     Toileting Toileting    Toileting assist Assist for toileting: Minimal Assistance - Patient > 75%     Transfers Chair/bed transfer  Transfers assist     Chair/bed transfer assist level: Moderate Assistance - Patient 50 - 74% Chair/bed transfer assistive device: Programmer, multimedia   Ambulation assist   Ambulation activity did not occur: Safety/medical concerns (requires use of RW, which pt did not use at baseline and pt reports feeling unsafe to attempt gait without RW today)          Walk 10 feet activity   Assist  Walk 10 feet activity did  not occur: Safety/medical concerns        Walk 50 feet activity   Assist Walk 50 feet with 2 turns activity did not occur: Safety/medical concerns         Walk 150 feet activity   Assist Walk 150 feet activity did not occur: Safety/medical concerns         Walk 10 feet on uneven surface  activity   Assist Walk 10 feet on uneven surfaces activity did not occur: Safety/medical concerns         Wheelchair     Assist Is the patient using a wheelchair?: Yes Type of Wheelchair: Manual    Wheelchair assist level: Dependent - Patient 0%      Wheelchair 50 feet with 2 turns activity    Assist        Assist Level: Dependent - Patient 0%   Wheelchair 150 feet activity     Assist      Assist Level: Dependent - Patient 0%   Blood  pressure 135/83, pulse (!) 108, temperature 98 F (36.7 C), resp. rate 18, last menstrual period 04/10/2021, SpO2 100 %.    Medical Problem List and Plan: 1.  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  Status post laparoscopic-assisted Hartmann left colectomy/end colostomy, small bowel resection, left retroperitoneal/flank abscess drainage 04/15/2021.  JP drain removed 04/28/2021.  Colostomy care as directed/wound [facility] Monday Wednesday Friday.  Plan outpatient follow-up medical oncology for systemic chemotherapy             -patient may not shower             -ELOS/Goals: 7-10d  -Continue CIR 2.  Antithrombotics: -DVT/anticoagulation:  Mechanical: Antiembolism stockings, thigh (TED hose) Bilateral lower extremities.  Check vascular study             -antiplatelet therapy: N/A 3. Pain: continue Voltaren gel 4 times daily, oxycodone/Robaxin as needed 4. Mood: Ativan as needed             -antipsychotic agents: N/A 5. Neuropsych: This patient is capable of making decisions on her own behalf. 6. Skin/Wound Care: Routine skin checks 7. Fluids/Electrolytes/Nutrition: Routine in and outs with follow-up chemistries 8.  Acute blood loss anemia.  Last transfusion 11/19.  Hgb 10.8 on 11/25, repeat tomorrow morning.  9.  Septic shock.  Weaned from pressors 10.  Acute respiratory failure.  Extubated 11/10.  Monitor oxygen saturations. 11.  History of inappropriate sinus tachycardia.  Followed by cardiology service Dr. Gardiner Rhyme.  Continue Lopressor 25 mg twice daily 12.  Anasarca.  Related to hypoalbuminemia and aggressive volume repletion.  Continue Lasix. 13.  Obesity.  BMI 39.99.  Dietary follow-up 14.  Leukocytosis. Improved 12,100 from 14,100. Follow-up CT abdomen abdomen/pelvis 12/27/2020 showed continued left retroperitoneal abscess inferior to the left kidney involving the left psoas muscle left abdominal wall overall size decreased since prior study..  There was a new fluid  collection in the cul-de-sac and wrapping around the uterus concerning for abscess reviewed by gynecology services no plan for intervention and currently maintained on empiric meropenem changed to Cipro and Flagyl 1125 2022 x 5 days.  Follow-up per general surgery 15. Hypokalemia: repeat potassium tomorrow morning  LOS: 1 days A FACE TO FACE EVALUATION WAS PERFORMED  Martha Clan P Chameka Mcmullen 05/02/2021, 5:01 PM

## 2021-05-03 ENCOUNTER — Inpatient Hospital Stay (HOSPITAL_COMMUNITY): Payer: BC Managed Care – PPO

## 2021-05-03 DIAGNOSIS — M7989 Other specified soft tissue disorders: Secondary | ICD-10-CM

## 2021-05-03 MED ORDER — DIPHENHYDRAMINE HCL 25 MG PO CAPS: 25.0000 mg | ORAL_CAPSULE | Freq: Four times a day (QID) | ORAL | Status: DC | PRN

## 2021-05-03 NOTE — Progress Notes (Signed)
Occupational Therapy Session Note  Patient Details  Name: Mikayla Rios MRN: [id number] Date of Birth: December 03, 1985  Today's Date: 05/04/2021 OT Individual Time: 1749-4496 OT Individual Time Calculation (min): 74 min    Short Term Goals: Week 1:  OT Short Term Goal 1 (Week 1): Pt will complete toileting with Min A for sit<stands OT Short Term Goal 2 (Week 1): Pt will complete LB dressing with Min A using AE as needed OT Short Term Goal 3 (Week 1): Pt will complete oral care while standing at the sink without seated rest to increase activity tolerance  Skilled Therapeutic Interventions/Progress Updates:    Pt greeted in bed, premedicated for abdominal pain from wound vac site. She was agreeable to engage in bathing/dressing tasks EOB at sit<stand level using RW for standing support. With increased time, pt able to elevate each LE up onto the bed to wash feet, don gripper socks, and also assist with donning sneakers. CGA-Min A for sit<stands from elevated surface using RW, more assistance needed for power up with onset of fatigue. Several rest breaks needed due to limited activity tolerance. She wanted to return to bed at close of session vs sit up due to fatigue. Side-stepping up towards [facility] completed with CGA using device. Mod A to return to bed. Pt remained in bed at close of session, all needs within reach and bed alarm set.  2nd Session 1:1 tx ()  Therapy Documentation Precautions:  Precautions Precautions: Fall, Other (comment) Precaution Comments: colostomy, wound vac Restrictions Weight Bearing Restrictions: No  Pain:   ADL: ADL Eating: Not assessed Grooming: Contact guard (able to complete a portion of oral care in standing, needed to finish in sitting due to decreased standing endurance) Where Assessed-Grooming: Standing at sink Upper Body Bathing: Supervision/safety Where Assessed-Upper Body Bathing: Edge of bed Lower Body Bathing: Minimal assistance Where Assessed-Lower  Body Bathing: Edge of bed Upper Body Dressing: Minimal assistance Where Assessed-Upper Body Dressing: Edge of bed Lower Body Dressing: Moderate assistance Where Assessed-Lower Body Dressing: Edge of bed Toileting: Minimal assistance Where Assessed-Toileting: Glass blower/designer: Moderate assistance Toilet Transfer Method: Ambulating (RW) Toilet Transfer Equipment: Raised toilet seat Tub/Shower Transfer: Not assessed  Therapy/Group: Individual Therapy  Deepak Bless A Yanni Quiroa 05/04/2021, 11:49 AM

## 2021-05-03 NOTE — Discharge Instructions (Addendum)
Inpatient Rehab Discharge Instructions  Mikayla Rios Discharge date and time: No discharge date for patient encounter.   Activities/Precautions/ Functional Status: Activity: As tolerated Diet: Regular Wound Care: Routine colostomy care Functional status:  ___ No restrictions     ___ Walk up steps independently ___ 24/7 supervision/assistance   ___ Walk up steps with assistance ___ Intermittent supervision/assistance  ___ Bathe/dress independently ___ Walk with walker     _x__ Bathe/dress with assistance ___ Walk Independently    ___ Shower independently ___ Walk with assistance    ___ Shower with assistance ___ No alcohol     ___ Return to work/school ________  Special Instructions: No driving smoking or alcohol  Routine colostomy care    Wet-to-dry dressings daily as needed right lower quadrant abdominal incision  COMMUNITY REFERRALS UPON DISCHARGE:    Home Health:   PT & RN                 [place] Equipment/Items Ordered:ROLLING WALKER, 3 IN 1 AND TUB BENCH                                                 Agency/Supplier: [facility]  [phone]     My questions have been answered and I understand these instructions. I will adhere to these goals and the provided educational materials after my discharge from the hospital.  Patient/Caregiver Signature _______________________________ Date __________  Clinician Signature _______________________________________ Date __________  Please bring this form and your medication list with you to all your follow-up doctor's appointments.     WOUND CARE: - dressing to be changed daily - supplies: sterile saline, kerlix/guaze, scissors, ABD pads, tape  - remove dressing and all packing carefully, moistening with sterile saline as needed to avoid packing/internal dressing sticking to the wound. - clean edges of skin around the wound with water/gauze, making sure there is no tape  debris or leakage left on skin that could cause skin irritation or breakdown. - dampen clean kerlix/gauze with sterile saline and pack wound from wound base to skin level, making sure to take note of any possible areas of wound tracking, tunneling and packing appropriately. Wound can be packed loosely. Trim kerlix/gauze to size if a whole roll/piece is not required. - cover wound with a dry ABD pad and secure with tape.  - write the date/time on the dry dressing/tape to better track when the last dressing change occurred. - apply any skin protectant/powder recommended by clinician to protect skin/skin folds. - change dressing as needed if leakage occurs, wound gets contaminated, or patient requests to shower. - patient may shower daily with wound open and following the shower the wound should be dried and a clean dressing placed.

## 2021-05-03 NOTE — Progress Notes (Signed)
BLE venous duplex has been completed.  Results can be found under chart review under CV PROC. 05/03/2021 10:56 AM Jalena Vanderlinden RVT, RDMS

## 2021-05-03 NOTE — Progress Notes (Signed)
PROGRESS NOTE   Subjective/Complaints: No new complaints Vascular ultrasound results reviewed and negative She is enjoying her lunch Very pleasant  ROS: +pain   Objective:   VAS [place] LOWER EXTREMITY VENOUS (DVT)  Result Date: 05/03/2021  Lower Venous DVT Study Patient Name:  DENIM START Majka  Date of Exam:   05/03/2021 Medical Rec #: [id number]       Accession #:    [id number] Date of Birth: Feb 26, 1986        Patient Gender: F Patient Age:   35 years Exam Location:  [facility] Procedure:      VAS [place] LOWER EXTREMITY VENOUS (DVT) Referring Phys: Lauraine Rinne --------------------------------------------------------------------------------  Indications: Swelling.  Comparison Study: No previous exams Performing Technologist: Jody Hill RVT, RDMS  Examination Guidelines: A complete evaluation includes B-mode imaging, spectral Doppler, color Doppler, and power Doppler as needed of all accessible portions of each vessel. Bilateral testing is considered an integral part of a complete examination. Limited examinations for reoccurring indications may be performed as noted. The reflux portion of the exam is performed with the patient in reverse Trendelenburg.  +---------+---------------+---------+-----------+----------+--------------+ RIGHT    CompressibilityPhasicitySpontaneityPropertiesThrombus Aging +---------+---------------+---------+-----------+----------+--------------+ CFV      Full           Yes      Yes                                 +---------+---------------+---------+-----------+----------+--------------+ SFJ      Full                                                        +---------+---------------+---------+-----------+----------+--------------+ FV Prox  Full           Yes      Yes                                 +---------+---------------+---------+-----------+----------+--------------+ FV Mid   Full            Yes      Yes                                 +---------+---------------+---------+-----------+----------+--------------+ FV DistalFull           Yes      Yes                                 +---------+---------------+---------+-----------+----------+--------------+ PFV      Full                                                        +---------+---------------+---------+-----------+----------+--------------+  POP      Full           Yes      Yes                                 +---------+---------------+---------+-----------+----------+--------------+ PTV      Full                                                        +---------+---------------+---------+-----------+----------+--------------+ PERO     Full                                                        +---------+---------------+---------+-----------+----------+--------------+   +---------+---------------+---------+-----------+----------+--------------+ LEFT     CompressibilityPhasicitySpontaneityPropertiesThrombus Aging +---------+---------------+---------+-----------+----------+--------------+ CFV      Full           Yes      Yes                                 +---------+---------------+---------+-----------+----------+--------------+ SFJ      Full                                                        +---------+---------------+---------+-----------+----------+--------------+ FV Prox  Full           Yes      Yes                                 +---------+---------------+---------+-----------+----------+--------------+ FV Mid   Full           Yes      Yes                                 +---------+---------------+---------+-----------+----------+--------------+ FV DistalFull           Yes      Yes                                 +---------+---------------+---------+-----------+----------+--------------+ PFV      Full                                                         +---------+---------------+---------+-----------+----------+--------------+ POP      Full           Yes      Yes                                 +---------+---------------+---------+-----------+----------+--------------+ PTV  Full                                                        +---------+---------------+---------+-----------+----------+--------------+ PERO     Full                                                        +---------+---------------+---------+-----------+----------+--------------+    Summary: BILATERAL: - No evidence of deep vein thrombosis seen in the lower extremities, bilaterally. - No evidence of superficial venous thrombosis in the lower extremities, bilaterally. -No evidence of popliteal cyst, bilaterally.   *See table(s) above for measurements and observations.    Preliminary    Recent Labs    05/01/21 0627  WBC 12.1*  HGB 10.8*  HCT 34.4*  PLT 790*   Recent Labs    05/01/21 0627  NA 139  K 3.2*  CL 101  CO2 28  GLUCOSE 97  BUN 20  CREATININE 0.56  CALCIUM 9.0    Intake/Output Summary (Last 24 hours) at 05/03/2021 1357 Last data filed at 05/03/2021 0755 Gross per 24 hour  Intake 480 ml  Output --  Net 480 ml        Physical Exam: Vital Signs Blood pressure (!) 138/98, pulse 97, temperature 98 F (36.7 C), temperature source Oral, resp. rate 18, last menstrual period 04/10/2021, SpO2 95 %. Gen: no distress, normal appearing, smiling, eating lunch HEENT: oral mucosa pink and moist, NCAT Cardio: Tachycardic Chest: normal effort, normal rate of breathing Abdominal:     Comments: Colostomy in place and functioning.  Right JP drain in place.  Wound VAC in place.  Ext: no edema Psych: pleasant, normal affect Skin: intact Neurologic: motor strength is 5/5 in bilateral deltoid, bicep, tricep, grip,4- hip flexor, knee extensors, ankle dorsiflexor and plantar flexor Sensory exam normal sensation to light touch  in  bilateral upper and lower extremities   Musculoskeletal: Full range of motion in all 4 extremities. No joint swelling   Assessment/Plan: 1. Functional deficits which require 3+ hours per day of interdisciplinary therapy in a comprehensive inpatient rehab setting. Physiatrist is providing close team supervision and 24 hour management of active medical problems listed below. Physiatrist and rehab team continue to assess barriers to discharge/monitor patient progress toward functional and medical goals  Care Tool:  Bathing    Body parts bathed by patient: Right arm, Left arm, Chest, Abdomen, Front perineal area, Buttocks, Right upper leg, Left upper leg, Face   Body parts bathed by helper: Right lower leg, Left lower leg     Bathing assist Assist Level: Minimal Assistance - Patient > 75%     Upper Body Dressing/Undressing Upper body dressing   What is the patient wearing?: Dress    Upper body assist Assist Level: Minimal Assistance - Patient > 75%    Lower Body Dressing/Undressing Lower body dressing      What is the patient wearing?: Underwear/pull up     Lower body assist Assist for lower body dressing: Minimal Assistance - Patient > 75%     Toileting Toileting    Toileting assist Assist for toileting: Minimal Assistance - Patient > 75%  Transfers Chair/bed transfer  Transfers assist     Chair/bed transfer assist level: Moderate Assistance - Patient 50 - 74% Chair/bed transfer assistive device: Programmer, multimedia   Ambulation assist   Ambulation activity did not occur: Safety/medical concerns (requires use of RW, which pt did not use at baseline and pt reports feeling unsafe to attempt gait without RW today)          Walk 10 feet activity   Assist  Walk 10 feet activity did not occur: Safety/medical concerns        Walk 50 feet activity   Assist Walk 50 feet with 2 turns activity did not occur: Safety/medical concerns          Walk 150 feet activity   Assist Walk 150 feet activity did not occur: Safety/medical concerns         Walk 10 feet on uneven surface  activity   Assist Walk 10 feet on uneven surfaces activity did not occur: Safety/medical concerns         Wheelchair     Assist Is the patient using a wheelchair?: Yes Type of Wheelchair: Manual    Wheelchair assist level: Dependent - Patient 0%      Wheelchair 50 feet with 2 turns activity    Assist        Assist Level: Dependent - Patient 0%   Wheelchair 150 feet activity     Assist      Assist Level: Dependent - Patient 0%   Blood pressure (!) 138/98, pulse 97, temperature 98 F (36.7 C), temperature source Oral, resp. rate 18, last menstrual period 04/10/2021, SpO2 95 %.    Medical Problem List and Plan: 1.  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  Status post laparoscopic-assisted Hartmann left colectomy/end colostomy, small bowel resection, left retroperitoneal/flank abscess drainage 04/15/2021.  JP drain removed 04/28/2021.  Colostomy care as directed/wound [facility] Monday Wednesday Friday.  Plan outpatient follow-up medical oncology for systemic chemotherapy             -patient may not shower             -ELOS/Goals: 7-10d  -Continue CIR 2.  impaired mobility: vascular ultrasound reviewed and negative for clot -DVT/anticoagulation:  Mechanical: Antiembolism stockings, thigh (TED hose) Bilateral lower extremities.               -antiplatelet therapy: N/A 3. Pain: continue Voltaren gel 4 times daily, oxycodone/Robaxin as needed 4. Mood: Ativan as needed             -antipsychotic agents: N/A 5. Neuropsych: This patient is capable of making decisions on her own behalf. 6. Skin/Wound Care: Routine skin checks 7. Fluids/Electrolytes/Nutrition: Routine in and outs with follow-up chemistries 8.  Acute blood loss anemia.  Last transfusion 11/19.  Hgb 10.8 on 11/25, repeat Monday morning   9.  Septic shock.  Weaned from pressors 10.  Acute respiratory failure.  Extubated 11/10.  Monitor oxygen saturations. 11.  History of inappropriate sinus tachycardia.  Followed by cardiology service Dr. Gardiner Rhyme.  Continue Lopressor 25 mg twice daily 12.  Anasarca.  Related to hypoalbuminemia and aggressive volume repletion.  Continue Lasix. 13.  Obesity.  BMI 39.99.  Dietary follow-up 14.  Leukocytosis. Improved 12,100 from 14,100. Follow-up CT abdomen abdomen/pelvis 12/27/2020 showed continued left retroperitoneal abscess inferior to the left kidney involving the left psoas muscle left abdominal wall overall size decreased since prior study..  There was a new fluid collection in  the cul-de-sac and wrapping around the uterus concerning for abscess reviewed by gynecology services no plan for intervention and currently maintained on empiric meropenem changed to Cipro and Flagyl 1125 2022 x 5 days.  Follow-up per general surgery 15. Hypokalemia: repeat potassium tomorrow morning  LOS: 2 days A FACE TO FACE EVALUATION WAS PERFORMED  Martha Clan P Zaide Mcclenahan 05/03/2021, 1:57 PM

## 2021-05-04 LAB — CBC WITH DIFFERENTIAL/PLATELET
Abs Immature Granulocytes: 0.04 10*3/uL (ref 0.00–0.07)
Basophils Absolute: 0 10*3/uL (ref 0.0–0.1)
Basophils Relative: 0 %
Eosinophils Absolute: 0.2 10*3/uL (ref 0.0–0.5)
Eosinophils Relative: 2 %
HCT: 32.2 % — ABNORMAL LOW (ref 36.0–46.0)
Hemoglobin: 10 g/dL — ABNORMAL LOW (ref 12.0–15.0)
Immature Granulocytes: 0 %
Lymphocytes Relative: 19 %
Lymphs Abs: 1.8 10*3/uL (ref 0.7–4.0)
MCH: 27.7 pg (ref 26.0–34.0)
MCHC: 31.1 g/dL (ref 30.0–36.0)
MCV: 89.2 fL (ref 80.0–100.0)
Monocytes Absolute: 0.5 10*3/uL (ref 0.1–1.0)
Monocytes Relative: 5 %
Neutro Abs: 7 10*3/uL (ref 1.7–7.7)
Neutrophils Relative %: 74 %
Platelets: 701 10*3/uL — ABNORMAL HIGH (ref 150–400)
RBC: 3.61 MIL/uL — ABNORMAL LOW (ref 3.87–5.11)
RDW: 14.7 % (ref 11.5–15.5)
WBC: 9.5 10*3/uL (ref 4.0–10.5)
nRBC: 0 % (ref 0.0–0.2)

## 2021-05-04 LAB — COMPREHENSIVE METABOLIC PANEL
ALT: 10 U/L (ref 0–44)
AST: 13 U/L — ABNORMAL LOW (ref 15–41)
Albumin: 2.4 g/dL — ABNORMAL LOW (ref 3.5–5.0)
Alkaline Phosphatase: 49 U/L (ref 38–126)
Anion gap: 9 (ref 5–15)
BUN: 11 mg/dL (ref 6–20)
CO2: 29 mmol/L (ref 22–32)
Calcium: 8.9 mg/dL (ref 8.9–10.3)
Chloride: 102 mmol/L (ref 98–111)
Creatinine, Ser: 0.62 mg/dL (ref 0.44–1.00)
GFR, Estimated: >60 mL/min (ref >60)
Glucose, Bld: 93 mg/dL (ref 70–99)
Potassium: 3.2 mmol/L — ABNORMAL LOW (ref 3.5–5.1)
Sodium: 140 mmol/L (ref 135–145)
Total Bilirubin: 0.5 mg/dL (ref 0.3–1.2)
Total Protein: 6.5 g/dL (ref 6.5–8.1)

## 2021-05-04 MED ORDER — POTASSIUM CHLORIDE CRYS ER 20 MEQ PO TBCR
40.0000 meq | EXTENDED_RELEASE_TABLET | Freq: Two times a day (BID) | ORAL | Status: AC
  Administered 2021-05-04 (×2): 40 meq via ORAL
  Filled 2021-05-04 (×2): qty 2

## 2021-05-04 MED ORDER — OXYCODONE HCL 5 MG PO TABS
10.0000 mg | ORAL_TABLET | Freq: Every day | ORAL | Status: DC | PRN
  Administered 2021-05-06 – 2021-05-13 (×4): 10 mg via ORAL
  Filled 2021-05-04 (×4): qty 2

## 2021-05-04 MED ORDER — POTASSIUM CHLORIDE CRYS ER 20 MEQ PO TBCR
20.0000 meq | EXTENDED_RELEASE_TABLET | Freq: Every day | ORAL | Status: DC
  Administered 2021-05-05 – 2021-05-15 (×11): 20 meq via ORAL
  Filled 2021-05-04 (×11): qty 1

## 2021-05-04 NOTE — Progress Notes (Signed)
Occupational Therapy Session Note  Patient Details  Name: Mikayla Rios MRN: [id number] Date of Birth: Dec 11, 1985  Today's Date: 05/04/2021 OT Individual Time: 1500-1529 OT Individual Time Calculation (min): 29 min    Short Term Goals: Week 1:  OT Short Term Goal 1 (Week 1): Pt will complete toileting with Min A for sit<stands OT Short Term Goal 2 (Week 1): Pt will complete LB dressing with Min A using AE as needed OT Short Term Goal 3 (Week 1): Pt will complete oral care while standing at the sink without seated rest to increase activity tolerance  Skilled Therapeutic Interventions/Progress Updates:    Pt greeted in bed and medicated for pain at start of session by RN. Pt agreeable to session. Mod A for supine<sit. Guided pt through UB stretches to relieve muscular tension and then pt completed B UE therapeutic exercise using red tband x10 reps 2 sets given instruction. CGA for side-stepping towards HOB using RW. Pt then returned to bed at close of session, all needs within reach and bed alarm set. Tx focus placed on UB strengthening/endurance needed for maximizing participation during functional tasks and transfers.   Therapy Documentation Precautions:  Precautions Precautions: Fall, Other (comment) Precaution Comments: colostomy, wound vac Restrictions Weight Bearing Restrictions: No Vital Signs: Therapy Vitals Temp: (!) 97.4 F (36.3 C) Temp Source: Oral Pulse Rate: (!) 105 Resp: 16 BP: 133/89 Patient Position (if appropriate): Lying Oxygen Therapy SpO2: 100 % O2 Device: Room Air ADL: ADL Eating: Not assessed Grooming: Contact guard (able to complete a portion of oral care in standing, needed to finish in sitting due to decreased standing endurance) Where Assessed-Grooming: Standing at sink Upper Body Bathing: Supervision/safety Where Assessed-Upper Body Bathing: Edge of bed Lower Body Bathing: Minimal assistance Where Assessed-Lower Body Bathing: Edge of bed Upper  Body Dressing: Minimal assistance Where Assessed-Upper Body Dressing: Edge of bed Lower Body Dressing: Moderate assistance Where Assessed-Lower Body Dressing: Edge of bed Toileting: Minimal assistance Where Assessed-Toileting: Glass blower/designer: Moderate assistance Toilet Transfer Method: Ambulating (RW) Toilet Transfer Equipment: Raised toilet seat Tub/Shower Transfer: Not assessed Vision   Perception    Praxis   Balance   Exercises:   Other Treatments:     Therapy/Group: Individual Therapy  Shavonne Ambroise A Sohrab Keelan 05/04/2021, 3:41 PM

## 2021-05-04 NOTE — Progress Notes (Signed)
Physical Therapy Session Note  Patient Details  Name: Mikayla Rios MRN: [id number] Date of Birth: 06-Oct-1985  Today's Date: 05/04/2021 PT Individual Time: 1300-1415 PT Individual Time Calculation (min): 75 min   Short Term Goals: Week 1:  PT Short Term Goal 1 (Week 1): Pt will perform supine<>sit with min assist PT Short Term Goal 2 (Week 1): Pt will perform sit<>stands using LRAD with min assist PT Short Term Goal 3 (Week 1): Pt will perform stand pivot transfers using LRAD with min assist (including come to stand) PT Short Term Goal 4 (Week 1): Pt will ambulate at least 160ft using LRAD with CGA PT Short Term Goal 5 (Week 1): Pt will ascend/descend 4 steps using B HRs with mod assist  Skilled Therapeutic Interventions/Progress Updates:    pt received in bed and agreeable to therapy. Supine>sit with supervision. Pt donned shoes with assist. NT assisted with emptying colostomy bag before continuing session. ambulatory transfer to bathroom for continent urine void, CGA for 3/3 toileting tasks. Pt required min facilitation for Sit to stand to RW fading to CGA by end of session.   Pt transported to therapy gym for time management and energy conservation. Extended rest breaks throughout, spent discussing PLOF, energy conservation strategies, and pt goals. Pt performed step taps with BUE support on hand rails 2 x 5, reported heaviness in LE, L>R. Pt then stepped up on 6" step x 2 with LLE, was unable to perform with RLE but reported this is baseline.   Pt then ambulated with RW and CGA, therapist managing wound vac, x 204 ft! 3 x 5 Sit to stand from mat table to RW fading to CGA for strength and LE endurance. Pt then ambulated back to room in the same manner. HR=104 after sitting ~30 sec. Pt returned to bed with mod A to lift BLE into bed and was left with all needs in reach and alarm active.   Therapy Documentation Precautions:  Precautions Precautions: Fall, Other (comment) Precaution  Comments: colostomy, wound vac Restrictions Weight Bearing Restrictions: No     Therapy/Group: Individual Therapy  [name] 05/04/2021, 4:05 PM

## 2021-05-04 NOTE — Progress Notes (Signed)
Inpatient Rehabilitation Care Coordinator Assessment and Plan Patient Details  Name: Mikayla Rios MRN: [id number] Date of Birth: 12/23/1985  Today's Date: 05/04/2021  Hospital Problems: Principal Problem:   Debility  Past Medical History:  Past Medical History:  Diagnosis Date   Allergy    seasonal   Anemia    Blood infection 1985   Blood transfusion without reported diagnosis    Diverticulitis    Hypertension    Obesity    Sleep apnea    Past Surgical History:  Past Surgical History:  Procedure Laterality Date   COLECTOMY WITH COLOSTOMY CREATION/HARTMANN PROCEDURE N/A 04/15/2021   Procedure: COLOSTOMY CREATION/HARTMANN PROCEDURE;  Surgeon: [name], MD;  Location: [facility];  Service: General;  Laterality: N/A;   IR CATHETER TUBE CHANGE  12/12/2020   IR CATHETER TUBE CHANGE  01/27/2021   IR CATHETER TUBE CHANGE  02/19/2021   IR CATHETER TUBE CHANGE  04/13/2021   IR RADIOLOGIST EVAL & MGMT  11/27/2020   IR RADIOLOGIST EVAL & MGMT  12/11/2020   IR RADIOLOGIST EVAL & MGMT  01/07/2021   IR RADIOLOGIST EVAL & MGMT  02/04/2021   IR SINUS/FIST TUBE CHK-NON GI  12/12/2020   IR SINUS/FIST TUBE CHK-NON GI  02/19/2021   LAPAROSCOPIC PARTIAL COLECTOMY N/A 04/15/2021   Procedure: LAPAROSCOPIC ASSISTED HARTMANN RESECTION;  Surgeon: [name], MD;  Location: [facility];  Service: General;  Laterality: N/A;   Social History:  reports that she has never smoked. She has never used smokeless tobacco. She reports that she does not currently use alcohol. She reports that she does not use drugs.  Family / Support Systems Marital Status: Married Patient Roles: Spouse, Parent, Other (Comment) (daughter) Spouse/Significant Other: Roderick Pee [phone] Children: has 55, 81, 47 yo children Other Supports: Dorothy-Mom [phone] Anticipated Caregiver: Roderick Pee and parents Ability/Limitations of Caregiver: Can provide physical assist if needed Caregiver Availability: 24/7 Family Dynamics: Close knit  family who pull together when one is in need. Pt has been doing fairly well prior to admission but has been dealing with issues for months.  Social History Preferred language: English Religion: Christian Cultural Background: No issues Education: [place] - How often do you need to have someone help you when you read instructions, pamphlets, or other written material from your doctor or pharmacy?: Never Writes: Yes Employment Status: Unemployed Date Retired/Disabled/Unemployed: 10/2020 Legal History/Current Legal Issues: No issues Guardian/Conservator: None-according to MD pt is capable of making her own decisions while here   Abuse/Neglect Abuse/Neglect Assessment Can Be Completed: Yes Physical Abuse: Denies Verbal Abuse: Denies Sexual Abuse: Denies Exploitation of patient/patient's resources: Denies Self-Neglect: Denies  Patient response to: Social Isolation - How often do you feel lonely or isolated from those around you?: Never  Emotional Status Pt's affect, behavior and adjustment status: Pt is motivated to regain her independence and get stronger for any further treatment she will have in the future. She has a bright attitude and just wants to get home to her kids. Recent Psychosocial Issues: other medical issues been dealing with since May 2022 Psychiatric History: No history due to long hospitalization and diagnosis of colon cancer would benefit from seeing neruo-psych while here Substance Abuse History: No issues  Patient / Family Perceptions, Expectations & Goals Pt/Family understanding of illness & functional limitations: Pt and husband can explain her diagnosis and plan to get stronger while here in case further treatment is needed. Both feel they have a good understanding of going forward and their questions and concerns have  been addressed. Premorbid pt/family roles/activities: Wife, Mother, daughter, church member, etc Anticipated changes in  roles/activities/participation: resume Pt/family expectations/goals: Pt states: " I hope to do well and get home to my children."  Husband states: " I will do what I need to do to help her, I was prior to admission."  [employer]: None Premorbid Home Care/DME Agencies: None Transportation available at discharge: Husband and parents Is the patient able to respond to transportation needs?: Yes In the past 12 months, has lack of transportation kept you from medical appointments or from getting medications?: No In the past 12 months, has lack of transportation kept you from meetings, work, or from getting things needed for daily living?: No Resource referrals recommended: Neuropsychology  Discharge Planning Living Arrangements: Spouse/significant other, Children Support Systems: Spouse/significant other, Children, Parent, Other relatives, Friends/neighbors, Church/faith community Type of Residence: Private residence Insurance Resources: Multimedia programmer (specify) Nurse, mental health) Financial Resources: Family Support Financial Screen Referred: No Living Expenses: Rent Money Management: Spouse Does the patient have any problems obtaining your medications?: No Home Management: Family members Patient/Family Preliminary Plans: Return home with husband and children, while husband works will go to parents home where she will have 24/7 supervision/care. Will work on discharge needs and provide support to pt. Care Coordinator Barriers to Discharge: Insurance for SNF coverage Care Coordinator Anticipated Follow Up Needs: HH/OP  Clinical Impression Pleasant female who is motivated to do well here and get stronger and back home to her family. Her family is supportive and willing to assist at discharge. She will have 24/7 care if needed at discharge. Do feel she would benefit from seeing neuro-psych while here  Elease Hashimoto 05/04/2021, 10:50 AM

## 2021-05-04 NOTE — IPOC Note (Addendum)
Overall Plan of Care [facility]) Patient Details Name: Mikayla Rios MRN: [id number] DOB: Mar 15, 1986  Admitting Diagnosis: [place] Hospital Problems: Principal Problem:   Debility     Functional Problem List: Nursing Bowel, Edema, Endurance, Medication Management, Pain, Nutrition, Safety, Skin Integrity  PT Balance, Perception, Behavior, Safety, Edema, Sensory, Endurance, Skin Integrity, Motor, Nutrition, Pain  OT Balance, Skin Integrity, Edema, Endurance, Motor, Pain, Safety  SLP    TR         Basic ADL's: OT Grooming, Bathing, Dressing, Toileting     Advanced  ADL's: OT Simple Meal Preparation     Transfers: PT Bed Mobility, Bed to Chair, Car, Chief Operating Officer: PT Ambulation, Stairs     Additional Impairments: OT None  SLP        TR      Anticipated Outcomes Item Anticipated Outcome  Self Feeding No goal  Swallowing      Basic self-care  Supervision  Insurance underwriter Transfers Supervision  Bowel/Bladder  min assist  Transfers  supervision using LRAD  Locomotion  supervision using LRAD  Communication     Cognition     Pain  < 3  Safety/Judgment  min assist and no falls   Therapy Plan: PT Intensity: Minimum of 1-2 x/day ,45 to 90 minutes PT Frequency: 5 out of 7 days PT Duration Estimated Length of Stay: ~2 weeks OT Intensity: Minimum of 1-2 x/day, 45 to 90 minutes OT Frequency: 5 out of 7 days OT Duration/Estimated Length of Stay: ~2 weeks     Due to the current state of emergency, patients may not be receiving their 3-hours of Medicare-mandated therapy.   Team Interventions: Nursing Interventions Patient/Family Education, Bowel Management, Disease Management/Prevention, Pain Management, Medication Management, Skin Care/Wound Management, Discharge Planning, Psychosocial Support  PT interventions Ambulation/gait training, Community reintegration, DME/adaptive equipment instruction, Neuromuscular  re-education, Psychosocial support, Stair training, UE/LE Strength taining/ROM, Training and development officer, Discharge planning, Functional electrical stimulation, Pain management, Skin care/wound management, Therapeutic Activities, UE/LE Coordination activities, Cognitive remediation/compensation, Disease management/prevention, Functional mobility training, Patient/family education, Splinting/orthotics, Therapeutic Exercise, Visual/perceptual remediation/compensation  OT Interventions Balance/vestibular training, Disease mangement/prevention, Self Care/advanced ADL retraining, Therapeutic Exercise, DME/adaptive equipment instruction, Pain management, Skin care/wound managment, UE/LE Strength taining/ROM, UE/LE Coordination activities, Patient/family education, Community reintegration, Functional mobility training, Discharge planning, Psychosocial support, Therapeutic Activities  SLP Interventions    TR Interventions    SW/CM Interventions     Barriers to Discharge MD  Medical stability, Home enviroment access/loayout, IV antibiotics, Neurogenic bowel and bladder, Wound care, Lack of/limited family support, Weight, Pending chemo/radiation, and colostomy and wound VAC  Nursing Decreased caregiver support, Incontinence, Wound Care, Lack of/limited family support, Weight, Medication compliance, Pending chemo/radiation Lives in a 2 level home with 1 step to enter. Half bath on main level. Spouse can provide min assist at discharge.  PT Home environment access/layout, Inaccessible home environment, Pending chemo/radiation, Wound Care    OT Lack of/limited family support, Pending chemo/radiation, Wound Care, Weight ?family able to provide 24/7 supervision  SLP      SW       Team Discharge Planning: Destination: PT-  Home,OT- Home , SLP-  Projected Follow-up: PT-Outpatient PT, 24 hour supervision/assistance, OT-  Home health OT, SLP-  Projected Equipment Needs: PT-To be determined, OT- To be  determined, SLP-  Equipment Details: PT- , OT-  Patient/family involved in discharge planning: PT- Patient,  OT-Patient, SLP-   MD ELOS: ~ 2 weeks Medical  Rehab Prognosis:  Good Assessment: Pt is a 35 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  Also has hypokalemia- repleting; and post op pain; also needs to learn how to manage colostomy.   Goals supervision by d/c. Min A for colostomy?    See Team Conference Notes for weekly updates to the plan of care

## 2021-05-04 NOTE — Progress Notes (Signed)
Inpatient Rehabilitation Center Individual Statement of Services  Patient Name:  Mikayla Rios  Date:  05/04/2021  Welcome to the [facility].  Our goal is to provide you with an individualized program based on your diagnosis and situation, designed to meet your specific needs.  With this comprehensive rehabilitation program, you will be expected to participate in at least 3 hours of rehabilitation therapies Monday-Friday, with modified therapy programming on the weekends.  Your rehabilitation program will include the following services:  Physical Therapy (PT), Occupational Therapy (OT), 24 hour per day rehabilitation nursing, Therapeutic Recreaction (TR), Neuropsychology, Care Coordinator, Rehabilitation Medicine, Nutrition Services, and Pharmacy Services  Weekly team conferences will be held on Tueday to discuss your progress.  Your Inpatient Rehabilitation Care Coordinator will talk with you frequently to get your input and to update you on team discussions.  Team conferences with you and your family in attendance may also be held.  Expected length of stay: 14 days  Overall anticipated outcome: supervision with cues  Depending on your progress and recovery, your program may change. Your Inpatient Rehabilitation Care Coordinator will coordinate services and will keep you informed of any changes. Your Inpatient Rehabilitation Care Coordinator's name and contact numbers are listed  below.  The following services may also be recommended but are not provided by the [facility]:   [place] will be made to provide these services after discharge if needed.  Arrangements include referral to agencies that provide these services.  Your insurance has been verified to be:  [employer] Your primary doctor is:  [name]  Pertinent information will be shared with your doctor and your  insurance company.  Inpatient Rehabilitation Care Coordinator:  Ovidio Kin, [place] or Emilia Beck  Information discussed with and copy given to patient by: Elease Hashimoto, 05/04/2021, 10:51 AM

## 2021-05-04 NOTE — Progress Notes (Signed)
Inpatient Rehabilitation  Patient information reviewed and entered into eRehab system by [place]ston Denner Nathanyl Andujo, OTR/L.   Information including medical coding, functional ability and quality indicators will be reviewed and updated through discharge.    

## 2021-05-04 NOTE — Progress Notes (Signed)
PROGRESS NOTE   Subjective/Complaints:  Pt reports little sore- VAC change is very painful for her- we discussed doing IV pain meds vs PO- we agreed on adding 10 mg Oxy daily prn for VAC changes.   Also she reports has a stitch/suture in RLQ of abdomen that is blue and needs to be removed.  Also needs wet to dry- like they were doing at [facility] on [address].   Colostomy putting out well- not hard.   ROS:  Pt denies SOB, abd pain, CP, N/V/C/D, and vision changes    Objective:   VAS [place] LOWER EXTREMITY VENOUS (DVT)  Result Date: 05/03/2021  Lower Venous DVT Study Patient Name:  MABEL ROLL Ley  Date of Exam:   05/03/2021 Medical Rec #: [id number]       Accession #:    [id number] Date of Birth: June 24, 1985        Patient Gender: F Patient Age:   35 years Exam Location:  [facility] Procedure:      VAS [place] LOWER EXTREMITY VENOUS (DVT) Referring Phys: Lauraine Rinne --------------------------------------------------------------------------------  Indications: Swelling.  Comparison Study: No previous exams Performing Technologist: Jody Hill RVT, RDMS  Examination Guidelines: A complete evaluation includes B-mode imaging, spectral Doppler, color Doppler, and power Doppler as needed of all accessible portions of each vessel. Bilateral testing is considered an integral part of a complete examination. Limited examinations for reoccurring indications may be performed as noted. The reflux portion of the exam is performed with the patient in reverse Trendelenburg.  +---------+---------------+---------+-----------+----------+--------------+ RIGHT    CompressibilityPhasicitySpontaneityPropertiesThrombus Aging +---------+---------------+---------+-----------+----------+--------------+ CFV      Full           Yes      Yes                                 +---------+---------------+---------+-----------+----------+--------------+ SFJ       Full                                                        +---------+---------------+---------+-----------+----------+--------------+ FV Prox  Full           Yes      Yes                                 +---------+---------------+---------+-----------+----------+--------------+ FV Mid   Full           Yes      Yes                                 +---------+---------------+---------+-----------+----------+--------------+ FV DistalFull           Yes      Yes                                 +---------+---------------+---------+-----------+----------+--------------+  PFV      Full                                                        +---------+---------------+---------+-----------+----------+--------------+ POP      Full           Yes      Yes                                 +---------+---------------+---------+-----------+----------+--------------+ PTV      Full                                                        +---------+---------------+---------+-----------+----------+--------------+ PERO     Full                                                        +---------+---------------+---------+-----------+----------+--------------+   +---------+---------------+---------+-----------+----------+--------------+ LEFT     CompressibilityPhasicitySpontaneityPropertiesThrombus Aging +---------+---------------+---------+-----------+----------+--------------+ CFV      Full           Yes      Yes                                 +---------+---------------+---------+-----------+----------+--------------+ SFJ      Full                                                        +---------+---------------+---------+-----------+----------+--------------+ FV Prox  Full           Yes      Yes                                 +---------+---------------+---------+-----------+----------+--------------+ FV Mid   Full           Yes      Yes                                  +---------+---------------+---------+-----------+----------+--------------+ FV DistalFull           Yes      Yes                                 +---------+---------------+---------+-----------+----------+--------------+ PFV      Full                                                        +---------+---------------+---------+-----------+----------+--------------+  POP      Full           Yes      Yes                                 +---------+---------------+---------+-----------+----------+--------------+ PTV      Full                                                        +---------+---------------+---------+-----------+----------+--------------+ PERO     Full                                                        +---------+---------------+---------+-----------+----------+--------------+    Summary: BILATERAL: - No evidence of deep vein thrombosis seen in the lower extremities, bilaterally. - No evidence of superficial venous thrombosis in the lower extremities, bilaterally. -No evidence of popliteal cyst, bilaterally.   *See table(s) above for measurements and observations.    Preliminary    Recent Labs    05/04/21 0619  WBC 9.5  HGB 10.0*  HCT 32.2*  PLT 701*   Recent Labs    05/04/21 0619  NA 140  K 3.2*  CL 102  CO2 29  GLUCOSE 93  BUN 11  CREATININE 0.62  CALCIUM 8.9    Intake/Output Summary (Last 24 hours) at 05/04/2021 0848 Last data filed at 05/03/2021 1834 Gross per 24 hour  Intake 480 ml  Output --  Net 480 ml        Physical Exam: Vital Signs Blood pressure 118/82, pulse 95, temperature 98 F (36.7 C), temperature source Oral, resp. rate 16, last menstrual period 04/10/2021, SpO2 100 %.    General: awake, alert, appropriate, sitting up eating breakfast;  NAD HENT: conjugate gaze; oropharynx moist CV: regular rate; no JVD Pulmonary: CTA B/L; no W/R/R- good air movement GI: soft, wound VAC on R side; colostomy  on L side- liquid stool; 2 dressings one on each side C/D/I.  Psychiatric: appropriate Neurological: Ox3 Ext: no edema Skin: intact except for abd wounds as above.  Neurologic: motor strength is 5/5 in bilateral deltoid, bicep, tricep, grip,4- hip flexor, knee extensors, ankle dorsiflexor and plantar flexor Sensory exam normal sensation to light touch  in bilateral upper and lower extremities   Musculoskeletal: Full range of motion in all 4 extremities. No joint swelling   Assessment/Plan: 1. Functional deficits which require 3+ hours per day of interdisciplinary therapy in a comprehensive inpatient rehab setting. Physiatrist is providing close team supervision and 24 hour management of active medical problems listed below. Physiatrist and rehab team continue to assess barriers to discharge/monitor patient progress toward functional and medical goals  Care Tool:  Bathing    Body parts bathed by patient: Right arm, Left arm, Chest, Abdomen, Front perineal area, Buttocks, Right upper leg, Left upper leg, Face   Body parts bathed by helper: Right lower leg, Left lower leg     Bathing assist Assist Level: Minimal Assistance - Patient > 75%     Upper Body Dressing/Undressing Upper body dressing   What is the patient wearing?: Dress  Upper body assist Assist Level: Minimal Assistance - Patient > 75%    Lower Body Dressing/Undressing Lower body dressing      What is the patient wearing?: Underwear/pull up     Lower body assist Assist for lower body dressing: Minimal Assistance - Patient > 75%     Toileting Toileting    Toileting assist Assist for toileting: Minimal Assistance - Patient > 75%     Transfers Chair/bed transfer  Transfers assist     Chair/bed transfer assist level: Moderate Assistance - Patient 50 - 74% Chair/bed transfer assistive device: Programmer, multimedia   Ambulation assist   Ambulation activity did not occur: Safety/medical  concerns (requires use of RW, which pt did not use at baseline and pt reports feeling unsafe to attempt gait without RW today)          Walk 10 feet activity   Assist  Walk 10 feet activity did not occur: Safety/medical concerns        Walk 50 feet activity   Assist Walk 50 feet with 2 turns activity did not occur: Safety/medical concerns         Walk 150 feet activity   Assist Walk 150 feet activity did not occur: Safety/medical concerns         Walk 10 feet on uneven surface  activity   Assist Walk 10 feet on uneven surfaces activity did not occur: Safety/medical concerns         Wheelchair     Assist Is the patient using a wheelchair?: Yes Type of Wheelchair: Manual    Wheelchair assist level: Dependent - Patient 0%      Wheelchair 50 feet with 2 turns activity    Assist        Assist Level: Dependent - Patient 0%   Wheelchair 150 feet activity     Assist      Assist Level: Dependent - Patient 0%   Blood pressure 118/82, pulse 95, temperature 98 F (36.7 C), temperature source Oral, resp. rate 16, last menstrual period 04/10/2021, SpO2 100 %.    Medical Problem List and Plan: 1.  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  Status post laparoscopic-assisted Hartmann left colectomy/end colostomy, small bowel resection, left retroperitoneal/flank abscess drainage 04/15/2021.  JP drain removed 04/28/2021.  Colostomy care as directed/wound [facility] Monday Wednesday Friday.  Plan outpatient follow-up medical oncology for systemic chemotherapy             -patient may not shower             -ELOS/Goals: 7-10d  -con't CIR- PT and OT 2.  impaired mobility: vascular ultrasound reviewed and negative for clot -DVT/anticoagulation:  Mechanical: Antiembolism stockings, thigh (TED hose) Bilateral lower extremities. 11/28- will check why not on Lovenox- is at high risk for DVT's due to CA dx.   likely due to needing so many  transfusions, so will recheck CBC on Thursday and see if stable.              -antiplatelet therapy: N/A 3. Pain: continue Voltaren gel 4 times daily, oxycodone/Robaxin as needed 4. Mood: Ativan as needed             -antipsychotic agents: N/A 5. Neuropsych: This patient is capable of making decisions on her own behalf. 6. Skin/Wound Care: Routine skin checks 7. Fluids/Electrolytes/Nutrition: Routine in and outs with follow-up chemistries 8.  Acute blood loss anemia.  Last transfusion 11/19.  Hgb 10.8 on 11/25, repeat  Monday morning   11/28- Hb down to 10 from 10.8- will recheck Thursday.  9.  Septic shock.  Weaned from pressors 10.  Acute respiratory failure.  Extubated 11/10.  Monitor oxygen saturations. 11.  History of inappropriate sinus tachycardia.  Followed by cardiology service Dr. Gardiner Rhyme.  Continue Lopressor 25 mg twice daily 12.  Anasarca with moderate protein malnutrtion.  Related to hypoalbuminemia and aggressive volume repletion.  Continue Lasix.  11/28- Alb 2.4- so far, pretty stable- will con't supplements.  13.  Obesity.  BMI 39.99.  Dietary follow-up 14.  Leukocytosis. Improved 12,100 from 14,100. Follow-up CT abdomen abdomen/pelvis 12/27/2020 showed continued left retroperitoneal abscess inferior to the left kidney involving the left psoas muscle left abdominal wall overall size decreased since prior study..  There was a new fluid collection in the cul-de-sac and wrapping around the uterus concerning for abscess reviewed by gynecology services no plan for intervention and currently maintained on empiric meropenem changed to Cipro and Flagyl 1125 2022 x 5 days.  Follow-up per general surgery  11.28- changed dressing changes to wet to dry.  15. Hypokalemia: repeat potassium tomorrow morning  11/28- will replete K+ 40 mEq x2 and recheck tomorrow. Since on Lasix, will order 20 mEq daily as of tomorrow.   LOS: 3 days A FACE TO FACE EVALUATION WAS PERFORMED  Jadee Golebiewski 05/04/2021, 8:48 AM

## 2021-05-05 LAB — BASIC METABOLIC PANEL
Anion gap: 9 (ref 5–15)
BUN: 11 mg/dL (ref 6–20)
CO2: 27 mmol/L (ref 22–32)
Calcium: 8.8 mg/dL — ABNORMAL LOW (ref 8.9–10.3)
Chloride: 103 mmol/L (ref 98–111)
Creatinine, Ser: 0.57 mg/dL (ref 0.44–1.00)
GFR, Estimated: >60 mL/min (ref >60)
Glucose, Bld: 97 mg/dL (ref 70–99)
Potassium: 3.6 mmol/L (ref 3.5–5.1)
Sodium: 139 mmol/L (ref 135–145)

## 2021-05-05 NOTE — Patient Care Conference (Signed)
Inpatient RehabilitationTeam Conference and Plan of Care Update Date: 05/05/2021   Time: 11:52 AM    Patient Name: Mikayla Rios      Medical Record Number: [id number]  Date of Birth: [birth year] Sex: Female         Room/Bed: 4W13C/4W13C-01 Payor Info: Payor: [place] / Plan: BCBS COMM PPO / Product Type: *No Product type* /    Admit Date/Time:  05/01/2021 12:07 PM  Primary Diagnosis:  [facility] Problems: Principal Problem:   Debility    Expected Discharge Date: Expected Discharge Date: 05/15/21  Team Members Present: Physician leading conference: Dr. Courtney Heys Social Worker Present: Ovidio Kin, LCSW Nurse Present: Dorthula Nettles, RN PT Present: Ailene Rud, PT OT Present: Lillia Corporal, OT PPS Coordinator present : Gunnar Fusi, SLP     Current Status/Progress Goal Weekly Team Focus  Bowel/Bladder   Contient of Bladder, left side colostomy. LBM 11/28  Remain continent  Toilet PRN   Swallow/Nutrition/ Hydration             ADL's   CGA bathing EOB at sit<stand level using RW, CGA UB dressing (dress), Min A LB dressing for footwear, Min-Mod A for sit<stands and CGA for ambulatory toilet transfers using RW  Supervision overall  Functional transfers, sit<stands, activity tolerance, adaptive self care skills, pt/family education   Mobility   min-CGA overall, supervision for standing with RW, gait up to 200 ft  supervision goals (has kids and 12 steps to bedroom)  endurance, gait training, stairs, maximizing independence   Communication             Safety/Cognition/ Behavioral Observations            Pain   C/O pain to lower abdomen. PRN meds available.  Pain <3-4/10  Assess Qshift and prn   Skin   Skin intact aside from, Abdominal wound vac, Colostomy, small open incision to left side of abdomen from prior jp drain.  Promote healing and prevention of infection.  Assess Skin Qshift and prn. Wound vac changes MWF, Daily/prn dressing change  to left side incision.     Discharge Planning:  Home with husband and children will go to parents home when husband goes to work and kids go to school. Will have 24/7 supervision at [place]   Team Discussion: Adjusting medications. Hgb dropped from 10.8 to 10 in 3 days. Will recheck labs on Thursday. Albumin is 2.4. Has required multiple transfusions PTA on CIR. Started pain medications for wound vac changes. Reports 6/10 pain to abdomin. Plan is to discharge home with spouse, he will take kids to school and take patient to parents house when he goes to work. Suture is out at [place] drain site. Colostomy training scheduled for today. Patient can see her progress! Patient on target to meet rehab goals: yes, contact guard to min assist currently. Ambulated 200 ft.  Supervision goals  *See Care Plan and progress notes for long and short-term goals.   Revisions to Treatment Plan:  Adjusting medications and monitoring labs  Teaching Needs: Family education, medication/pain management, colostomy management, skin/wound care, safety awareness, transfer/gait training, etc.  Current Barriers to Discharge: Decreased caregiver support, Home enviroment access/layout, Wound care, Weight, Weight bearing restrictions, Pending chemo/radiation, Nutritional means, and endurance, stairs, wound vac, and stairs.  Possible Resolutions to Barriers: Family education Colostomy education Follow up [facility] nursing  Follow up HH/Outpatient therapy Provide education to optimize nutrition intake      Medical Summary Current Status: VAC  change- better with pain meds; bottom MASD- still has JP drain site-  Barriers to Discharge: Decreased family/caregiver support;Home enviroment access/layout;Other (comments);Weight bearing restrictions;Wound care;Medical stability;Pending chemo/radiation;Weight  Barriers to Discharge Comments: new colostomy-wound VAC M/W/F Possible Resolutions to [employer]: Hb dropping- will see  again Thursday and if Hb stable; then start Lovenox; K+ 3.6- better with repletion- - endurance biggest barrier-supervision goals- walked 265ft- but can't do stairs; d/c 05/15/21   Continued Need for Acute Rehabilitation Level of Care: The patient requires daily medical management by a physician with specialized training in physical medicine and rehabilitation for the following reasons: Direction of a multidisciplinary physical rehabilitation program to maximize functional independence : Yes Medical management of patient stability for increased activity during participation in an intensive rehabilitation regime.: Yes Analysis of laboratory values and/or radiology reports with any subsequent need for medication adjustment and/or medical intervention. : Yes   I attest that I was present, lead the team conference, and concur with the assessment and plan of the team.   Cristi Loron 05/05/2021, 5:02 PM

## 2021-05-05 NOTE — Progress Notes (Signed)
PROGRESS NOTE   Subjective/Complaints:  Pt reports really sore after therapy yesterday- muscles sore.  Worked really hard in therapy; VAC change went much better with additional pain meds and muscle relaxant- much better.  Got stitch out of abdomen as well.  Sleeping well.     ROS:   Pt denies SOB, abd pain, CP, N/V/C/D, and vision changes    Objective:   VAS [place] LOWER EXTREMITY VENOUS (DVT)  Result Date: 05/04/2021  Lower Venous DVT Study Patient Name:  Mikayla Rios  Date of Exam:   05/03/2021 Medical Rec #: [id number]       Accession #:    [id number] Date of Birth: May 26, 1986        Patient Gender: F Patient Age:   35 years Exam Location:  [facility] Procedure:      VAS [place] LOWER EXTREMITY VENOUS (DVT) Referring Phys: Lauraine Rinne --------------------------------------------------------------------------------  Indications: Swelling.  Comparison Study: No previous exams Performing Technologist: Jody Hill RVT, RDMS  Examination Guidelines: A complete evaluation includes B-mode imaging, spectral Doppler, color Doppler, and power Doppler as needed of all accessible portions of each vessel. Bilateral testing is considered an integral part of a complete examination. Limited examinations for reoccurring indications may be performed as noted. The reflux portion of the exam is performed with the patient in reverse Trendelenburg.  +---------+---------------+---------+-----------+----------+--------------+ RIGHT    CompressibilityPhasicitySpontaneityPropertiesThrombus Aging +---------+---------------+---------+-----------+----------+--------------+ CFV      Full           Yes      Yes                                 +---------+---------------+---------+-----------+----------+--------------+ SFJ      Full                                                         +---------+---------------+---------+-----------+----------+--------------+ FV Prox  Full           Yes      Yes                                 +---------+---------------+---------+-----------+----------+--------------+ FV Mid   Full           Yes      Yes                                 +---------+---------------+---------+-----------+----------+--------------+ FV DistalFull           Yes      Yes                                 +---------+---------------+---------+-----------+----------+--------------+ PFV      Full                                                        +---------+---------------+---------+-----------+----------+--------------+  POP      Full           Yes      Yes                                 +---------+---------------+---------+-----------+----------+--------------+ PTV      Full                                                        +---------+---------------+---------+-----------+----------+--------------+ PERO     Full                                                        +---------+---------------+---------+-----------+----------+--------------+   +---------+---------------+---------+-----------+----------+--------------+ LEFT     CompressibilityPhasicitySpontaneityPropertiesThrombus Aging +---------+---------------+---------+-----------+----------+--------------+ CFV      Full           Yes      Yes                                 +---------+---------------+---------+-----------+----------+--------------+ SFJ      Full                                                        +---------+---------------+---------+-----------+----------+--------------+ FV Prox  Full           Yes      Yes                                 +---------+---------------+---------+-----------+----------+--------------+ FV Mid   Full           Yes      Yes                                  +---------+---------------+---------+-----------+----------+--------------+ FV DistalFull           Yes      Yes                                 +---------+---------------+---------+-----------+----------+--------------+ PFV      Full                                                        +---------+---------------+---------+-----------+----------+--------------+ POP      Full           Yes      Yes                                 +---------+---------------+---------+-----------+----------+--------------+ PTV  Full                                                        +---------+---------------+---------+-----------+----------+--------------+ PERO     Full                                                        +---------+---------------+---------+-----------+----------+--------------+     Summary: BILATERAL: - No evidence of deep vein thrombosis seen in the lower extremities, bilaterally. - No evidence of superficial venous thrombosis in the lower extremities, bilaterally. -No evidence of popliteal cyst, bilaterally.   *See table(s) above for measurements and observations. Electronically signed by Orlie Pollen on 05/04/2021 at 10:21:26 AM.    Final    Recent Labs    05/04/21 0619  WBC 9.5  HGB 10.0*  HCT 32.2*  PLT 701*   Recent Labs    05/04/21 0619 05/05/21 0517  NA 140 139  K 3.2* 3.6  CL 102 103  CO2 29 27  GLUCOSE 93 97  BUN 11 11  CREATININE 0.62 0.57  CALCIUM 8.9 8.8*    Intake/Output Summary (Last 24 hours) at 05/05/2021 0850 Last data filed at 05/04/2021 1700 Gross per 24 hour  Intake 420 ml  Output 0 ml  Net 420 ml        Physical Exam: Vital Signs Blood pressure 109/86, pulse 88, temperature 98.1 F (36.7 C), temperature source Oral, resp. rate 16, height 5\' 3"  (1.6 m), last menstrual period 04/10/2021, SpO2 100 %.     General: awake, alert, appropriate, sitting up in bed on phone with mother; 100% tray eaten;  NAD HENT:  conjugate gaze; oropharynx moist CV: regular rate; no JVD Pulmonary: CTA B/L; no W/R/R- good air movement GI: soft; TTP diffusely from surgical incisions, no rebound; distended vs protuberant; hyperactive BS- just ate- Wound VAC in place; colostomy putting out liquid stool Psychiatric: appropriate; interactive Neurological: Ox3  Ext: no edema Skin: intact except for abd wounds as above.  Neurologic: motor strength is 5/5 in bilateral deltoid, bicep, tricep, grip,4- hip flexor, knee extensors, ankle dorsiflexor and plantar flexor Sensory exam normal sensation to light touch  in bilateral upper and lower extremities   Musculoskeletal: Full range of motion in all 4 extremities. No joint swelling   Assessment/Plan: 1. Functional deficits which require 3+ hours per day of interdisciplinary therapy in a comprehensive inpatient rehab setting. Physiatrist is providing close team supervision and 24 hour management of active medical problems listed below. Physiatrist and rehab team continue to assess barriers to discharge/monitor patient progress toward functional and medical goals  Care Tool:  Bathing    Body parts bathed by patient: Right arm, Left arm, Chest, Abdomen, Front perineal area, Buttocks, Right upper leg, Left upper leg, Face, Right lower leg, Left lower leg   Body parts bathed by helper: Right lower leg, Left lower leg     Bathing assist Assist Level: Contact Guard/Touching assist     Upper Body Dressing/Undressing Upper body dressing   What is the patient wearing?: Dress    Upper body assist Assist Level: Contact Guard/Touching assist    Lower Body Dressing/Undressing Lower body dressing  What is the patient wearing?: Underwear/pull up     Lower body assist Assist for lower body dressing: Contact Guard/Touching assist     Toileting Toileting    Toileting assist Assist for toileting: Minimal Assistance - Patient > 75%     Transfers Chair/bed  transfer  Transfers assist     Chair/bed transfer assist level: Moderate Assistance - Patient 50 - 74% Chair/bed transfer assistive device: Programmer, multimedia   Ambulation assist   Ambulation activity did not occur: Safety/medical concerns (requires use of RW, which pt did not use at baseline and pt reports feeling unsafe to attempt gait without RW today)          Walk 10 feet activity   Assist  Walk 10 feet activity did not occur: Safety/medical concerns        Walk 50 feet activity   Assist Walk 50 feet with 2 turns activity did not occur: Safety/medical concerns         Walk 150 feet activity   Assist Walk 150 feet activity did not occur: Safety/medical concerns         Walk 10 feet on uneven surface  activity   Assist Walk 10 feet on uneven surfaces activity did not occur: Safety/medical concerns         Wheelchair     Assist Is the patient using a wheelchair?: Yes Type of Wheelchair: Manual    Wheelchair assist level: Dependent - Patient 0%      Wheelchair 50 feet with 2 turns activity    Assist        Assist Level: Dependent - Patient 0%   Wheelchair 150 feet activity     Assist      Assist Level: Dependent - Patient 0%   Blood pressure 109/86, pulse 88, temperature 98.1 F (36.7 C), temperature source Oral, resp. rate 16, height 5\' 3"  (1.6 m), last menstrual period 04/10/2021, SpO2 100 %.    Medical Problem List and Plan: 1.  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  Status post laparoscopic-assisted Hartmann left colectomy/end colostomy, small bowel resection, left retroperitoneal/flank abscess drainage 04/15/2021.  JP drain removed 04/28/2021.  Colostomy care as directed/wound [facility] Monday Wednesday Friday.  Plan outpatient follow-up medical oncology for systemic chemotherapy             -patient may not shower             -ELOS/Goals: 7-10d  Con't CIR_ PT and OT- can shower  if VAC comes off while here; team conference today to determine length of stay 2.  impaired mobility: vascular ultrasound reviewed and negative for clot -DVT/anticoagulation:  Mechanical: Antiembolism stockings, thigh (TED hose) Bilateral lower extremities. 11/28- will check why not on Lovenox- is at high risk for DVT's due to CA dx.   likely due to needing so many transfusions, so will recheck CBC on Thursday and see if stable.  11/29- per surgery note, can start Lovenox, however will almost 1 point drop, will recheck Thursday and if OK, start Lovenox.              -antiplatelet therapy: N/A 3. Pain: continue Voltaren gel 4 times daily, oxycodone/Robaxin as needed  11/29- addition of pain meds for VAC change went really well- con't regimen 4. Mood: Ativan as needed  11/29- will check if she was on at home?             -antipsychotic agents: N/A 5. Neuropsych: This patient is  capable of making decisions on her own behalf. 6. Skin/Wound Care: Routine skin checks 7. Fluids/Electrolytes/Nutrition: Routine in and outs with follow-up chemistries 8.  Acute blood loss anemia.  Last transfusion 11/19.  Hgb 10.8 on 11/25, repeat Monday morning   11/28- Hb down to 10 from 10.8- will recheck Thursday.  9.  Septic shock.  Weaned from pressors 10.  Acute respiratory failure.  Extubated 11/10.  Monitor oxygen saturations. 11.  History of inappropriate sinus tachycardia.  Followed by cardiology service Dr. Gardiner Rhyme.  Continue Lopressor 25 mg twice daily 12.  Anasarca with moderate protein malnutrtion.  Related to hypoalbuminemia and aggressive volume repletion.  Continue Lasix.  11/28- Alb 2.4- so far, pretty stable- will con't supplements.  13.  Obesity.  BMI 39.99.  Dietary follow-up 14.  Leukocytosis. Improved 12,100 from 14,100. Follow-up CT abdomen abdomen/pelvis 12/27/2020 showed continued left retroperitoneal abscess inferior to the left kidney involving the left psoas muscle left abdominal wall overall  size decreased since prior study..  There was a new fluid collection in the cul-de-sac and wrapping around the uterus concerning for abscess reviewed by gynecology services no plan for intervention and currently maintained on empiric meropenem changed to Cipro and Flagyl 1125 2022 x 5 days.  Follow-up per general surgery  11/28- changed dressing changes to wet to dry.   11/29- stitch removed 15. Hypokalemia: repeat potassium tomorrow morning  11/28- will replete K+ 40 mEq x2 and recheck tomorrow. Since on Lasix, will order 20 mEq daily as of tomorrow.   11/29- K+ 3.6- will recheck Thursday-   LOS: 4 days A FACE TO FACE EVALUATION WAS PERFORMED  Mikayla Rios 05/05/2021, 8:50 AM

## 2021-05-05 NOTE — Progress Notes (Signed)
Occupational Therapy Session Note  Patient Details  Name: Mikayla Rios MRN: [id number] Date of Birth: Mar 03, 1986  Today's Date: 05/05/2021 OT Individual Time: 2549-8264 OT Individual Time Calculation (min): 54 min    Short Term Goals: Week 1:  OT Short Term Goal 1 (Week 1): Pt will complete toileting with Min A for sit<stands OT Short Term Goal 2 (Week 1): Pt will complete LB dressing with Min A using AE as needed OT Short Term Goal 3 (Week 1): Pt will complete oral care while standing at the sink without seated rest to increase activity tolerance   Skilled Therapeutic Interventions/Progress Updates:    Session 1: Pt greeted at time of session reclined in bed resting, fatigued from therapy sessions and c/o sore muscles, but agreeable to try to participate in OT session. Supine > sit Min A for trunk elevation and stand pivot bed > wheelchair with CGA and RW. Pt politely declined ADL at this time, did not need to toilet either. Discussion and education with pt regarding wound vac, ostomy care, etc. Pt stating she was having training on this later today. Pt transported to main gym dependent for time and focused on global conditioning and UB endurance for 1x20 ball throws at rebounder for the following: standard throw, throw + overhead, throw + chest press with rest breaks in between sets. Discussion regarding home set up, bathroom accessibility, and stairs set up and pt receptive to recommendations. Transported to ADL apartment and in kitchen, pt educated on various energy conservation techniques and work simplification to make for easier access. Back in room, stand pivot w/c > bed CGA. Alarm on call bell in reach.    Therapy Documentation Precautions:  Precautions Precautions: Fall, Other (comment) Precaution Comments: colostomy, wound vac Restrictions Weight Bearing Restrictions: No     Therapy/Group: Individual Therapy  Viona Gilmore 05/05/2021, 7:21 AM

## 2021-05-05 NOTE — Progress Notes (Signed)
Physical Therapy Session Note  Patient Details  Name: Mikayla Rios MRN: [id number] Date of Birth: 12/25/1985  Today's Date: 05/05/2021 PT Individual Time: 0800-0900, 1415-1530 PT Individual Time Calculation (min): 60 min, 75 min  Short Term Goals: Week 1:  PT Short Term Goal 1 (Week 1): Pt will perform supine<>sit with min assist PT Short Term Goal 2 (Week 1): Pt will perform sit<>stands using LRAD with min assist PT Short Term Goal 3 (Week 1): Pt will perform stand pivot transfers using LRAD with min assist (including come to stand) PT Short Term Goal 4 (Week 1): Pt will ambulate at least 151ft using LRAD with CGA PT Short Term Goal 5 (Week 1): Pt will ascend/descend 4 steps using B HRs with mod assist  Skilled Therapeutic Interventions/Progress Updates:    Session 1: pt received in bed and agreeable to therapy. Pt premedicated for pain before session. Supine>sit with supervision and elevated HOB. Pt self selected clothing and donned shirt with supervision, min A to thread LLE and mod A to power up to stand and pull pants over hips, supervision in standing. Pt ambulated to sink with RW and CGA, stood at sink to wash face and brush teeth. Donned shoes with assist while sitting in w/c.  Pt propelled w/c with BUE to day room for endurance and functional mobility. CGA Stand pivot transfer with RW to nustep. Nustep 4 x 3 min with 2 min rest breaks at level 5. HR noted to be ~134 after each bout. RPE= 12/20 after second bout.   Pt ambulated x 50 ft with RW and CGA. Pt then returned to bed with mod A for LE management. Pt remained in bed at end of session and was left with all needs in reach and alarm active.   Session 2: pt received in bed and agreeable to therapy. Supine>sit with supervision. Stand pivot transfer with RW and CGA. Sit to stand with as much as min A and as little as supervision at times throughout session. Pt reported 6/10 pain in her abdomen, nursing provided medication during  session.   Session focused on NMR via gait without AD to improve confidence and balance. Pt began with 170 ft walk with supervision and RW. Transitioned to wall railing x 20 ft with CGA and assist to manage wound vac. Pt then ambulated x 15 ft, x 20 ft, x 25 ft, x30 ft, x 40 ft with no AD and CGA. VC for incr arm swing and verbal encouragement.  Transitioned to step ups on 4 " step, 3 x 5 BIL for LE strength and stair navigation. Pt then expressed exhaustion and was directed in gentle UE ROM/stretching in standing for improved balance and relief of soreness.   Pt returned to bed with mod A, to assist with lift BLE into bed. Pt was left with all needs in reach and alarm active.   Therapy Documentation Precautions:  Precautions Precautions: Fall, Other (comment) Precaution Comments: colostomy, wound vac Restrictions Weight Bearing Restrictions: No General:       Therapy/Group: Individual Therapy  [name] 05/05/2021, 8:28 AM

## 2021-05-05 NOTE — Progress Notes (Signed)
Patient ID: Mikayla Rios, female   DOB: 18-Jun-1985, 34 y.o.   MRN: [id number]  Met with pt to inform team conference goals of supervision level and target discharge date of 12/9. She is pleased with her progress and is working hard in her therapies. Will have husband or Mom come in closer to discharge to be educated on her care at discharge. She is glad to be here and getting the therapies she needs, she is doing things she was not doing at home prior to this admission. Colostomy education started today.

## 2021-05-05 NOTE — Consult Note (Addendum)
[place] Nurse ostomy follow up Patient receiving care in [facility] [phone] Stoma type/location: LUQ colostomy Stomal assessment/size:  2" pink budded Peristomal assessment: intact Treatment options for stomal/peristomal skin: Barrier ring Output: Mushy brown  Ostomy pouching: 2pc. 2 3/4" pouching system Patient had a box of sterile pouching colostomy kits in her room. This was either ordered wrong or materials sent the wrong item. These can be used but the separate items should be ordered which are lawson #s 443, 2, & [phone] Education provided: Patient was able to do the complete pouch change today from removing the pouch and cleaning around the stoma to applying the barrier ring, cutting the skin barrier to size and placing the pouch on after closing the pouch. We reviewed when to empty the pouch, cleaning the opening, air fresheners, SS kit, scissors and ordering supplies. She did exceptionally well and this [place] nurse does not feeling that we need to continue to follow but are available to the patient and medical team if needed.  Eating with an Ostomy; Foods and Their Effects; Food Reference Chart for People with an Ostomy given to the patient ([place]) www.ostomy.org  Enrolled patient in [place] Start Discharge program: Yes (previously)  Please re-consult if needed  Jocelyn Lamer L. Tamala Julian, MSN, RN, Flasher, Lysle Pearl, [facility] Wound Treatment Associate Pager [phone]

## 2021-05-06 NOTE — Progress Notes (Signed)
Wound assessment & dressing change with [name], RN.  Wound bed reddish/pink with a little bit of yellow in the middle. Little drainage.  Measurements: Width-4.3  Depth- 0.7 Length- 7.2  Mikayla Rios [name]

## 2021-05-06 NOTE — Progress Notes (Signed)
Physical Therapy Session Note  Patient Details  Name: Mikayla Rios MRN: [id number] Date of Birth: 26-Mar-1986  Today's Date: 05/06/2021 PT Individual Time: 0805-0900 and 1420-1530 PT Individual Time Calculation (min): 55 min and 70 min  Short Term Goals: Week 1:  PT Short Term Goal 1 (Week 1): Pt will perform supine<>sit with min assist PT Short Term Goal 2 (Week 1): Pt will perform sit<>stands using LRAD with min assist PT Short Term Goal 3 (Week 1): Pt will perform stand pivot transfers using LRAD with min assist (including come to stand) PT Short Term Goal 4 (Week 1): Pt will ambulate at least 187f using LRAD with CGA PT Short Term Goal 5 (Week 1): Pt will ascend/descend 4 steps using B HRs with mod assist  Skilled Therapeutic Interventions/Progress Updates: Pt presented in bed agreeable to therapy. Pt states pain has decreased from earlier this am to approx 6/10 in abdominal area. Premedicated and rest breaks provided as needed. Pt performed supine to sit at EOB with CGA, use of bed features and increased time/effort. With increased time pt used wet washcloth to wipe upper body and emptied colostomy bag with supervision. Pt donned shirt and pants with set up and increased time. Performed Sit to stand from elevated bed with CGA to pull pants over hips. Pt rotated to place L leg on bed to doff grip sock and don regular sock and shoe with supervision. Pt unknowingly placed RLE in figure four position to doff grip sock and don regular sock/shoe. Pt surprised to be able to do this and then realized was able to place LLE in figure four position as well! Pt required minA and bed raised higher to perform Sit to stand to transfer to w/c due to fatigue. Pt then performed oral hygiene mod I at sink. Pt then agreeable to ambulation for endurance. Pt ambulated 2149fwith RW and w/c follow with PTA providing assist for wound vac management but pt ambulating at supervision level. Once back in room pt returned  to bed with minA for LLE management but was able to pull up RLE on own with increased time/effort. PTA provided education on using gait belt as leg lifter at times when needed to which pt was receptive to. Pt left in bed at end of session with bed alarm on, call bell within reach and needs met.   Tx2: Pt presented in bed agreeable to therapy. Pt states pain currently 4/10, request no additional intervention as next scheduled meds due at end of session. Pt performed supine to sit with CGA and use of bed features. Pt requesting to use bathroom. Pt donned shoes with set up and able to place BLE in figure four position to don shoes. Pt then performed ambulatory transfer to toilet with RW and CGA. Pt was close S for toilet transfers and was supervision for LB clothing management. Pt then able to ambulate to sink and perform hand hygiene in standing. Once completed pt took seated rest. Pt then amblated to rehab gym [id number] supervision and PTA following with w/c and managing wound vac. Pt then participated in Sit to stand from 22", 21", and 20" height x 5 each without AD for BLE strengthening. Pt did use BUE to assist and also required increased time between bouts. Pt then transferred to w/c and moved to parallel bars. Pt performed step ups to 4in step x 5 alternating BLE as well as lateral step ups x 5. Pt noted to have increased dependence on BUE  when pushing up. Pt then ambulated back to room via dayroom >253f in same manner as prior. Once back in room at EOB pt doffed shirt and donned gown with set ups and performed Sit to stand without AD to lower pants over hips and then removed pants while sitting EOB. With increased effort pt performed sit to supine with bed flat and was able to manage BLE with increased effort onto bed. Pt left in bed with bed alarm on, call bell within reach and needs met.      Therapy Documentation Precautions:  Precautions Precautions: Fall, Other (comment) Precaution Comments:  colostomy, wound vac Restrictions Weight Bearing Restrictions: No General:   Vital Signs:  Pain:   Mobility:   Locomotion :    Trunk/Postural Assessment :    Balance:   Exercises:   Other Treatments:      Therapy/Group: Individual Therapy  Mirel Hundal 05/06/2021, 12:27 PM

## 2021-05-06 NOTE — Progress Notes (Signed)
PROGRESS NOTE   Subjective/Complaints:  No new issues some soreness in abd area from therapy , some shoulder soreness as well.  No bladder issues, pt learning which foods she tolerates well, discussed limiting fiber intake  ROS:   Pt denies SOB, abd pain, CP, N/V/C/D, and vision changes    Objective:   No results found. Recent Labs    05/04/21 0619  WBC 9.5  HGB 10.0*  HCT 32.2*  PLT 701*    Recent Labs    05/04/21 0619 05/05/21 0517  NA 140 139  K 3.2* 3.6  CL 102 103  CO2 29 27  GLUCOSE 93 97  BUN 11 11  CREATININE 0.62 0.57  CALCIUM 8.9 8.8*     Intake/Output Summary (Last 24 hours) at 05/06/2021 1210 Last data filed at 05/06/2021 0800 Gross per 24 hour  Intake 480 ml  Output --  Net 480 ml         Physical Exam: Vital Signs Blood pressure 137/90, pulse (!) 103, temperature 97.8 F (36.6 C), temperature source Oral, resp. rate 16, height 5\' 3"  (1.6 m), last menstrual period 04/10/2021, SpO2 100 %.  General: No acute distress Mood and affect are appropriate Heart: Regular rate and rhythm no rubs murmurs or extra sounds Lungs: Clear to auscultation, breathing unlabored, no rales or wheezes Abdomen: Positive bowel sounds, soft nontender to palpation, nondistended Extremities: No clubbing, cyanosis, or edema  Musculoskeletal: Full range of motion in all 4 extremities. No joint swelling   Skin: intact except for abd wounds as above.  Neurologic: motor strength is 5/5 in bilateral deltoid, bicep, tricep, grip,4- hip flexor, knee extensors, ankle dorsiflexor and plantar flexor Sensory exam normal sensation to light touch  in bilateral upper and lower extremities   Musculoskeletal: Full range of motion in all 4 extremities. No joint swelling   Assessment/Plan: 1. Functional deficits which require 3+ hours per day of interdisciplinary therapy in a comprehensive inpatient rehab  setting. Physiatrist is providing close team supervision and 24 hour management of active medical problems listed below. Physiatrist and rehab team continue to assess barriers to discharge/monitor patient progress toward functional and medical goals  Care Tool:  Bathing    Body parts bathed by patient: Right arm, Left arm, Chest, Abdomen, Front perineal area, Buttocks, Right upper leg, Left upper leg, Face, Right lower leg, Left lower leg   Body parts bathed by helper: Right lower leg, Left lower leg     Bathing assist Assist Level: Contact Guard/Touching assist     Upper Body Dressing/Undressing Upper body dressing   What is the patient wearing?: Dress    Upper body assist Assist Level: Contact Guard/Touching assist    Lower Body Dressing/Undressing Lower body dressing      What is the patient wearing?: Underwear/pull up     Lower body assist Assist for lower body dressing: Contact Guard/Touching assist     Toileting Toileting    Toileting assist Assist for toileting: Minimal Assistance - Patient > 75%     Transfers Chair/bed transfer  Transfers assist     Chair/bed transfer assist level: Contact Guard/Touching assist Chair/bed transfer assistive device: Programmer, multimedia  Ambulation assist   Ambulation activity did not occur: Safety/medical concerns (requires use of RW, which pt did not use at baseline and pt reports feeling unsafe to attempt gait without RW today)  Assist level: Contact Guard/Touching assist Assistive device: Walker-rolling Max distance: 200   Walk 10 feet activity   Assist  Walk 10 feet activity did not occur: Safety/medical concerns  Assist level: Contact Guard/Touching assist Assistive device: Walker-rolling   Walk 50 feet activity   Assist Walk 50 feet with 2 turns activity did not occur: Safety/medical concerns  Assist level: Contact Guard/Touching assist Assistive device: Walker-rolling    Walk 150  feet activity   Assist Walk 150 feet activity did not occur: Safety/medical concerns  Assist level: Contact Guard/Touching assist Assistive device: Walker-rolling    Walk 10 feet on uneven surface  activity   Assist Walk 10 feet on uneven surfaces activity did not occur: Safety/medical concerns         Wheelchair     Assist Is the patient using a wheelchair?: Yes Type of Wheelchair: Manual    Wheelchair assist level: Dependent - Patient 0%      Wheelchair 50 feet with 2 turns activity    Assist        Assist Level: Dependent - Patient 0%   Wheelchair 150 feet activity     Assist      Assist Level: Dependent - Patient 0%   Blood pressure 137/90, pulse (!) 103, temperature 97.8 F (36.6 C), temperature source Oral, resp. rate 16, height 5\' 3"  (1.6 m), last menstrual period 04/10/2021, SpO2 100 %.    Medical Problem List and Plan: 1.  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  Status post laparoscopic-assisted Hartmann left colectomy/end colostomy, small bowel resection, left retroperitoneal/flank abscess drainage 04/15/2021.  JP drain removed 04/28/2021.  Colostomy care as directed/wound [facility] Monday Wednesday Friday.  Plan outpatient follow-up medical oncology for systemic chemotherapy             -patient may not shower             -ELOS/Goals: 7-10d  Con't CIR_ PT and OT- can shower vac is off , d/c date 12/9 2.  impaired mobility: vascular ultrasound reviewed and negative for clot -DVT/anticoagulation:  Mechanical: Antiembolism stockings, thigh (TED hose) Bilateral lower extremities. 11/28- will check why not on Lovenox- is at high risk for DVT's due to CA dx.   likely due to needing so many transfusions, so will recheck CBC on Thursday and see if stable.  11/29- per surgery note, can start Lovenox, however will almost 1 point drop, will recheck Thursday and if OK, start Lovenox.              -antiplatelet therapy: N/A 3.  Pain: continue Voltaren gel 4 times daily, oxycodone/Robaxin as needed  11/29- addition of pain meds for VAC change went really well- con't regimen 4. Mood: Ativan as needed  11/29- will check if she was on at home?             -antipsychotic agents: N/A 5. Neuropsych: This patient is capable of making decisions on her own behalf. 6. Skin/Wound Care: Routine skin checks 7. Fluids/Electrolytes/Nutrition: Routine in and outs with follow-up chemistries 8.  Acute blood loss anemia.  Last transfusion 11/19.  Hgb 10.8 on 11/25, repeat Monday morning   11/28- Hb down to 10 from 10.8- will recheck Thursday.  9.  Septic shock.  Weaned from pressors 10.  Acute respiratory failure.  Extubated 11/10.  Monitor oxygen saturations. 11.  History of inappropriate sinus tachycardia.  Followed by cardiology service Dr. Gardiner Rhyme.  Continue Lopressor 25 mg twice daily 12.  Anasarca with moderate protein malnutrtion.  Related to hypoalbuminemia and aggressive volume repletion.  Continue Lasix.  11/28- Alb 2.4- so far, pretty stable- will con't supplements.  13.  Obesity.  BMI 39.99.  Dietary follow-up 14.  Leukocytosis. resolved completes Cipro and Flagyl today.  Follow-up per general surgery  1 15. Hypokalemia: repeat potassium tomorrow morning  11/28- will replete K+ 40 mEq x2 and recheck tomorrow. Since on Lasix, will order 20 mEq daily as of tomorrow.   11/29- K+ 3.6- will recheck Thursday-   LOS: 5 days A FACE TO FACE EVALUATION WAS PERFORMED  Charlett Blake 05/06/2021, 12:10 PM

## 2021-05-06 NOTE — Progress Notes (Signed)
Per inpt rehab note patient will be d/c on 05/15/2021.  I have reschedule her appt with Dr Burr Medico for 05/25/2021 at 1500.  I called her but she did not answer and I was unable to leave a message.  I will call her again tomorrow.

## 2021-05-06 NOTE — Progress Notes (Signed)
Occupational Therapy Session Note  Patient Details  Name: Mikayla Rios MRN: [id number] Date of Birth: Dec 31, 1985  Today's Date: 05/06/2021 OT Individual Time: 1103-1594 OT Individual Time Calculation (min): 71 min    Short Term Goals: Week 1:  OT Short Term Goal 1 (Week 1): Pt will complete toileting with Min A for sit<stands OT Short Term Goal 2 (Week 1): Pt will complete LB dressing with Min A using AE as needed OT Short Term Goal 3 (Week 1): Pt will complete oral care while standing at the sink without seated rest to increase activity tolerance   Skilled Therapeutic Interventions/Progress Updates:    Pt greeted at time of session bed level resting, stating she has muscle soreness from exercise but no new pain. Agreeable to OT session despite fatigue. Politely declined ADL, stating she did this already today. Supine > sit CGA with bed features and extended time. Stand pivot transfers throughout session bed <> wheelchair <> mat with CGA and use of RW. Transported to ADL apartment and focused on problem solving bathroom set up and shower transfers. Performed TTB transfer with Min A only for full foot clearance over tub wall. Stand pivot w/c <> mat with CGA and RW, performed UB strengthening with 3# dowel for the following: bicep curl, chest press, modified overhead press, and FWD circle for 1x10-20 each as appropriate. Performed 2x20 ball hits with 1# dowel for core/UB strengthening. 1x10 wood choppers seated with 2kg weighted ball. All exercises performed sitting unsupported and cues for form. Once back in room, walked short distance to bathroom with CGA, toilet transfer same manner. 3/3 toileting tasks in same manner. Ambulated to bed level same manner and emptied ostomy bag with Set up/Supervision for emptying ostomy with 150 cc result. Pt in bed resting alarm on call bell in reach.    Therapy Documentation Precautions:  Precautions Precautions: Fall, Other (comment) Precaution Comments:  colostomy, wound vac Restrictions Weight Bearing Restrictions: No     Therapy/Group: Individual Therapy  Viona Gilmore 05/06/2021, 7:18 AM

## 2021-05-07 LAB — COMPREHENSIVE METABOLIC PANEL
ALT: 8 U/L (ref 0–44)
AST: 24 U/L (ref 15–41)
Albumin: 2.6 g/dL — ABNORMAL LOW (ref 3.5–5.0)
Alkaline Phosphatase: 40 U/L (ref 38–126)
Anion gap: 11 (ref 5–15)
BUN: 10 mg/dL (ref 6–20)
CO2: 26 mmol/L (ref 22–32)
Calcium: 8.8 mg/dL — ABNORMAL LOW (ref 8.9–10.3)
Chloride: 99 mmol/L (ref 98–111)
Creatinine, Ser: 0.52 mg/dL (ref 0.44–1.00)
GFR, Estimated: >60 mL/min (ref >60)
Glucose, Bld: 92 mg/dL (ref 70–99)
Potassium: 4 mmol/L (ref 3.5–5.1)
Sodium: 136 mmol/L (ref 135–145)
Total Bilirubin: 0.7 mg/dL (ref 0.3–1.2)
Total Protein: 6.4 g/dL — ABNORMAL LOW (ref 6.5–8.1)

## 2021-05-07 LAB — CBC WITH DIFFERENTIAL/PLATELET
Abs Immature Granulocytes: 0.06 10*3/uL (ref 0.00–0.07)
Basophils Absolute: 0 10*3/uL (ref 0.0–0.1)
Basophils Relative: 0 %
Eosinophils Absolute: 0.3 10*3/uL (ref 0.0–0.5)
Eosinophils Relative: 3 %
HCT: 30.3 % — ABNORMAL LOW (ref 36.0–46.0)
Hemoglobin: 9.9 g/dL — ABNORMAL LOW (ref 12.0–15.0)
Immature Granulocytes: 1 %
Lymphocytes Relative: 21 %
Lymphs Abs: 2 10*3/uL (ref 0.7–4.0)
MCH: 28.4 pg (ref 26.0–34.0)
MCHC: 32.7 g/dL (ref 30.0–36.0)
MCV: 86.8 fL (ref 80.0–100.0)
Monocytes Absolute: 0.6 10*3/uL (ref 0.1–1.0)
Monocytes Relative: 6 %
Neutro Abs: 6.6 10*3/uL (ref 1.7–7.7)
Neutrophils Relative %: 69 %
Platelets: 597 10*3/uL — ABNORMAL HIGH (ref 150–400)
RBC: 3.49 MIL/uL — ABNORMAL LOW (ref 3.87–5.11)
RDW: 14.4 % (ref 11.5–15.5)
WBC: 9.5 10*3/uL (ref 4.0–10.5)
nRBC: 0 % (ref 0.0–0.2)

## 2021-05-07 MED ORDER — ENOXAPARIN SODIUM 40 MG/0.4ML IJ SOSY
40.0000 mg | PREFILLED_SYRINGE | INTRAMUSCULAR | Status: DC
  Administered 2021-05-07 – 2021-05-15 (×9): 40 mg via SUBCUTANEOUS
  Filled 2021-05-07 (×9): qty 0.4

## 2021-05-07 NOTE — Progress Notes (Signed)
Occupational Therapy Session Note  Patient Details  Name: Mikayla Rios MRN: [id number] Date of Birth: 24-Mar-1986  Today's Date: 05/07/2021 OT Individual Time: 1100-1157 and 1300-1409 OT Individual Time Calculation (min): 57 min and 69 min   Short Term Goals: Week 1:  OT Short Term Goal 1 (Week 1): Pt will complete toileting with Min A for sit<stands OT Short Term Goal 2 (Week 1): Pt will complete LB dressing with Min A using AE as needed OT Short Term Goal 3 (Week 1): Pt will complete oral care while standing at the sink without seated rest to increase activity tolerance   Skilled Therapeutic Interventions/Progress Updates:    Session 1: Pt greeted at time of session reclined in bed resting, muscle soreness present and pt stating most sore in lower back. Pt did not provide # and able to participate in OT session. Supine > sit Min A and ambulated to/from bed > bathroom > wheelchair with CGA. Transferred to commode and 3/3 toileting tasks with CGA and RW. Able to stand at sink as well for hand hygiene. Wheelchair transport to gym and focused on UB strength/global endurance for 6 minutes on SCIFIT with changing directions half way through on level 3, increased to level 4 for second round. HR monitored throughout and elevated to 110 with activity. Pt performing multiple rounds of dynamic standing at [facility] with no UE support reaching out of BOS and crossing midline, standing up to 4 minutes. Transported back to room, stand pivot > bed CGA. Alarm on call bell in reach.   Session 2: Pt greeted at time of session semireclined in bed, agreeable to OT session and wanting to perform sink level ADL for bathing and dressing. No true pain during session but pt does have muscle soreness throughout from previous exercises. Bed mobility performed with CGA to sit EOB and walked short distance to wheelchair with CGA and RW. Pt self propel up to sink and performed UB/LB bathing with CGA for standing portion to fully  wash buttocks and periarea in standing. Pt able to complete figure four for LB bathe and dress to thoroughly wash feet and don new gripper socks. Donned new gown with CGA for standing portion. Discussion throughout session for energy conservation and saving energy for most desired tasks, bathroom set up to sit for sponge bathing, etc. Husband present at this time and discussed patient's CLOF and verbalized understanding. Pt ambulating short distance room <> day room and back with RW and CGA for safety to demonstrate CLOF for husband and training on use of RW. Back in room set up bed level alarm on call bell in reach and wound vac plugged in.  Therapy Documentation Precautions:  Precautions Precautions: Fall, Other (comment) Precaution Comments: colostomy, wound vac Restrictions Weight Bearing Restrictions: No      Therapy/Group: Individual Therapy  Viona Gilmore 05/07/2021, 7:23 AM

## 2021-05-07 NOTE — Progress Notes (Signed)
Nutrition Follow-up  DOCUMENTATION CODES:   Obesity unspecified  INTERVENTION:  Continue Ensure Enlive po BID, each supplement provides 350 kcal and 20 grams of protein   Encourage adequate PO intake.   NUTRITION DIAGNOSIS:   Increased nutrient needs related to chronic illness (cancer) as evidenced by estimated needs; ongoing  GOAL:   Patient will meet greater than or equal to 90% of their needs; progressing  MONITOR:   PO intake, Supplement acceptance, Labs, Weight trends, Skin, I & O's  REASON FOR ASSESSMENT:   Malnutrition Screening Tool    ASSESSMENT:   35 year old female with past medical history of hypertension, inappropriate sinus tachycardia, obesity, obstructive sleep apnea and diverticulosis with complicated diverticulitis and abscess formation 11/2020 status post IR guided placement of 2 left lower quadrant drains. Pt s/p laparoscopic assisted hartman (L colectomy/end colostomy), small bowel resection, L retroperitoneal/flank abscess drainage 11/9. Pt with colonic adenocarcinoma. Pt admitted to CIR.  Meal completion has been 65-100%. Pt reports having a good appetite. Abdomen sore from therapy. Pt has been tolerating her PO diet. Pt currently has Ensure ordered and has been consuming them. RD to continue with current orders to aid in caloric and protein needs.   NUTRITION - FOCUSED PHYSICAL EXAM:  Flowsheet Row Most Recent Value  Orbital Region No depletion  Upper Arm Region No depletion  Thoracic and Lumbar Region No depletion  Buccal Region No depletion  Temple Region No depletion  Clavicle Bone Region No depletion  Clavicle and Acromion Bone Region No depletion  Scapular Bone Region No depletion  Dorsal Hand No depletion  Patellar Region No depletion  Anterior Thigh Region No depletion  Posterior Calf Region No depletion  Edema (RD Assessment) None  Hair Reviewed  Eyes Reviewed  Mouth Reviewed  Skin Reviewed  Nails Reviewed      Labs and  medications reviewed.   Diet Order:   Diet Order             Diet Carb Modified Fluid consistency: Thin; Room service appropriate? Yes  Diet effective now                   EDUCATION NEEDS:   Not appropriate for education at this time  Skin:  Skin Assessment: Reviewed RN Assessment Skin Integrity Issues:: Incisions, Wound VAC Wound Vac: abdomen Incisions: abdomen  Last BM:  12/1 colostomy  Height:   Ht Readings from Last 1 Encounters:  05/04/21 5\' 3"  (1.6 m)    Weight:   Wt Readings from Last 1 Encounters:  05/06/21 92.5 kg   BMI:  Body mass index is 36.12 kg/m.  Estimated Nutritional Needs:   Kcal:  2100-2300  Protein:  110-120 grams  Fluid:  >/= 2 L/day  Corrin Parker, MS, RD, LDN RD pager number/after hours weekend pager number on Amion.

## 2021-05-07 NOTE — Progress Notes (Signed)
I spoke with Ms Mikayla Rios.  I let her know we rescheduled her appt to 12/19 at 1500 with Dr Burr Medico as she will be d/c from inpt rehab.  All questions were answered.  She verbalized understanding.

## 2021-05-07 NOTE — Progress Notes (Signed)
PROGRESS NOTE   Subjective/Complaints:  Pt reports main pain is soreness from therapy- but is bothersome- trying to "stay ahead of pain" with meds.  Pain meds with VAC take the edge off, but not too bad.  Tired a lot- thinks due to pain meds.  A lot of itching- dry skin.  Asking about Disability- directed to SW.   Stool is thicker- pasty in colostomy.   ROS:   Pt denies SOB, abd pain, CP, N/V/C/D, and vision changes     Objective:   No results found. Recent Labs    05/07/21 0511  WBC 9.5  HGB 9.9*  HCT 30.3*  PLT 597*   Recent Labs    05/05/21 0517 05/07/21 0511  NA 139 136  K 3.6 4.0  CL 103 99  CO2 27 26  GLUCOSE 97 92  BUN 11 10  CREATININE 0.57 0.52  CALCIUM 8.8* 8.8*    Intake/Output Summary (Last 24 hours) at 05/07/2021 0824 Last data filed at 05/07/2021 3474 Gross per 24 hour  Intake 240 ml  Output 152 ml  Net 88 ml        Physical Exam: Vital Signs Blood pressure 120/86, pulse (!) 104, temperature 99.7 F (37.6 C), resp. rate 18, height 5\' 3"  (1.6 m), weight 92.5 kg, last menstrual period 04/10/2021, SpO2 100 %.     General: awake, alert, appropriate, sitting up in bed - on phone with mother; NAD HENT: conjugate gaze; oropharynx moist CV: borderline tachycardic rate; no JVD Pulmonary: CTA B/L; no W/R/R- good air movement GI: soft, NT, ND, (+)BS- colostomy working- but stool a little too thick- more pasty- not formed. VAC on R side of abdomen- good seal.  Psychiatric: appropriate- trying to be bright- but more sad today Neurological: Ox3 Skin: intact except for abd wounds as above.  Neurologic: motor strength is 5/5 in bilateral deltoid, bicep, tricep, grip,4- hip flexor, knee extensors, ankle dorsiflexor and plantar flexor Sensory exam normal sensation to light touch  in bilateral upper and lower extremities   Musculoskeletal: Full range of motion in all 4 extremities. No joint  swelling   Assessment/Plan: 1. Functional deficits which require 3+ hours per day of interdisciplinary therapy in a comprehensive inpatient rehab setting. Physiatrist is providing close team supervision and 24 hour management of active medical problems listed below. Physiatrist and rehab team continue to assess barriers to discharge/monitor patient progress toward functional and medical goals  Care Tool:  Bathing    Body parts bathed by patient: Right arm, Left arm, Chest, Abdomen, Front perineal area, Buttocks, Right upper leg, Left upper leg, Face, Right lower leg, Left lower leg   Body parts bathed by helper: Right lower leg, Left lower leg     Bathing assist Assist Level: Contact Guard/Touching assist     Upper Body Dressing/Undressing Upper body dressing   What is the patient wearing?: Dress    Upper body assist Assist Level: Contact Guard/Touching assist    Lower Body Dressing/Undressing Lower body dressing      What is the patient wearing?: Underwear/pull up     Lower body assist Assist for lower body dressing: Contact Guard/Touching assist  Toileting Toileting    Toileting assist Assist for toileting: Contact Guard/Touching assist     Transfers Chair/bed transfer  Transfers assist     Chair/bed transfer assist level: Contact Guard/Touching assist Chair/bed transfer assistive device: Programmer, multimedia   Ambulation assist   Ambulation activity did not occur: Safety/medical concerns (requires use of RW, which pt did not use at baseline and pt reports feeling unsafe to attempt gait without RW today)  Assist level: Contact Guard/Touching assist Assistive device: Walker-rolling Max distance: 200   Walk 10 feet activity   Assist  Walk 10 feet activity did not occur: Safety/medical concerns  Assist level: Contact Guard/Touching assist Assistive device: Walker-rolling   Walk 50 feet activity   Assist Walk 50 feet with 2 turns  activity did not occur: Safety/medical concerns  Assist level: Contact Guard/Touching assist Assistive device: Walker-rolling    Walk 150 feet activity   Assist Walk 150 feet activity did not occur: Safety/medical concerns  Assist level: Contact Guard/Touching assist Assistive device: Walker-rolling    Walk 10 feet on uneven surface  activity   Assist Walk 10 feet on uneven surfaces activity did not occur: Safety/medical concerns         Wheelchair     Assist Is the patient using a wheelchair?: Yes Type of Wheelchair: Manual    Wheelchair assist level: Dependent - Patient 0%      Wheelchair 50 feet with 2 turns activity    Assist        Assist Level: Dependent - Patient 0%   Wheelchair 150 feet activity     Assist      Assist Level: Dependent - Patient 0%   Blood pressure 120/86, pulse (!) 104, temperature 99.7 F (37.6 C), resp. rate 18, height 5\' 3"  (1.6 m), weight 92.5 kg, last menstrual period 04/10/2021, SpO2 100 %.    Medical Problem List and Plan: 1.  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  Status post laparoscopic-assisted Hartmann left colectomy/end colostomy, small bowel resection, left retroperitoneal/flank abscess drainage 04/15/2021.  JP drain removed 04/28/2021.  Colostomy care as directed/wound [facility] Monday Wednesday Friday.  Plan outpatient follow-up medical oncology for systemic chemotherapy             -patient may not shower             -ELOS/Goals: 7-10d  Con't CIR_ PT and OT- explained no shower until VAC off.  2.  impaired mobility: vascular ultrasound reviewed and negative for clot -DVT/anticoagulation:  Mechanical: Antiembolism stockings, thigh (TED hose) Bilateral lower extremities. 11/28- will check why not on Lovenox- is at high risk for DVT's due to CA dx.   likely due to needing so many transfusions, so will recheck CBC on Thursday and see if stable.  11/29- per surgery note, can start  Lovenox, however will almost 1 point drop, will recheck Thursday and if OK, start Lovenox.   12/1- will start Lovenox since Hb stable. Will recheck labs Monday             -antiplatelet therapy: N/A 3. Pain: continue Voltaren gel 4 times daily, oxycodone/Robaxin as needed  11/29- addition of pain meds for VAC change went really well- con't regimen 4. Mood: Ativan as needed  11/29- will check if she was on at home?             -antipsychotic agents: N/A 5. Neuropsych: This patient is capable of making decisions on her own behalf. 6. Skin/Wound Care: Routine  skin checks 7. Fluids/Electrolytes/Nutrition: Routine in and outs with follow-up chemistries 8.  Acute blood loss anemia.  Last transfusion 11/19.  Hgb 10.8 on 11/25, repeat Monday morning   11/28- Hb down to 10 from 10.8- will recheck Thursday.   12/1- Hb 9.9- stable- con't regimen and start Lovenox. As above.  9.  Septic shock.  Weaned from pressors 10.  Acute respiratory failure.  Extubated 11/10.  Monitor oxygen saturations. 11.  History of inappropriate sinus tachycardia.  Followed by cardiology service Dr. Gardiner Rhyme.  Continue Lopressor 25 mg twice daily  12/1- rate 90s-low 100s- con't regimen 12.  Anasarca with moderate protein malnutrtion.  Related to hypoalbuminemia and aggressive volume repletion.  Continue Lasix.  11/28- Alb 2.4- so far, pretty stable- will con't supplements.   12/1- Alb up to 2.6- will con't to monitor 13.  Obesity.  BMI 39.99.  Dietary follow-up 14.  Leukocytosis. resolved completes Cipro and Flagyl today.  Follow-up per general surgery  12/1- WBC 9.5- stable 15. Hypokalemia: repeat potassium tomorrow morning  11/28- will replete K+ 40 mEq x2 and recheck tomorrow. Since on Lasix, will order 20 mEq daily as of tomorrow.   11/29- K+ 3.6- will recheck Thursday-   12/1- K+ 4.0- will con't regimen and recheck Monday.   LOS: 6 days A FACE TO FACE EVALUATION WAS PERFORMED  Mikayla Rios 05/07/2021, 8:24 AM

## 2021-05-07 NOTE — Progress Notes (Signed)
Physical Therapy Session Note  Patient Details  Name: Mikayla Rios MRN: [id number] Date of Birth: 1986/04/07  Today's Date: 05/07/2021 PT Individual Time: 0900-1015 PT Individual Time Calculation (min): 75 min   Short Term Goals: Week 1:  PT Short Term Goal 1 (Week 1): Pt will perform supine<>sit with min assist PT Short Term Goal 2 (Week 1): Pt will perform sit<>stands using LRAD with min assist PT Short Term Goal 3 (Week 1): Pt will perform stand pivot transfers using LRAD with min assist (including come to stand) PT Short Term Goal 4 (Week 1): Pt will ambulate at least 179ft using LRAD with CGA PT Short Term Goal 5 (Week 1): Pt will ascend/descend 4 steps using B HRs with mod assist  Skilled Therapeutic Interventions/Progress Updates:    pt received in bed and agreeable to therapy. Pt reports 5/10 pain, but not time for medication yet. Provided rest breaks and cues for movement techniques that decr pain throughout. Supine>sit with VC for technique from flat bed with bed rail. Pt then dressed with set up assist, save shoes which required max A to slide over heel. Min A to stand from low bed to RW. Pt ambulated to sink with RW and close supervision and completed morning hygiene routine mod I at w/c level.   Rest of session focused on LE strengthening and reducing reliance on UE support. Pt performed the following exercises to promote LE strength and endurance:  Gait to/from therapy gym and throughout session with RW and supervision. VC to reduce reliance on UE. Step ups on 4" step with BUE support 4 x 10 with 1-2 min rest break.  Bosu lunges in 3 x 12 BIL in // bars  Pt returned to bed with mod A for LE management and VC for technique to decr pain. Pt remained in bed and was left with all needs in reach and alarm active.   Therapy Documentation Precautions:  Precautions Precautions: Fall, Other (comment) Precaution Comments: colostomy, wound vac Restrictions Weight Bearing  Restrictions: No    Therapy/Group: Individual Therapy  [name] 05/07/2021, 9:13 AM

## 2021-05-07 NOTE — Progress Notes (Signed)
Occupational Therapy Session Note  Patient Details  Name: Mikayla Rios MRN: [id number] Date of Birth: 1985-12-17  Today's Date: 05/08/2021 OT Individual Time: 1350-1509 OT Individual Time Calculation (min): 79 min    Short Term Goals: Week 1:  OT Short Term Goal 1 (Week 1): Pt will complete toileting with Min A for sit<stands OT Short Term Goal 2 (Week 1): Pt will complete LB dressing with Min A using AE as needed OT Short Term Goal 3 (Week 1): Pt will complete oral care while standing at the sink without seated rest to increase activity tolerance  Skilled Therapeutic Interventions/Progress Updates:    Pt greeted in bed, premedicated for pain. She reported taking both an oxy and a muscle relaxer this morning and thinks that this has made her legs "too relaxed" today. Pt requested EOB tx. Started with completing supine<sit from flat bed without bedrail per setup at home. Note that her bed is significantly elevated off of the floor. Pt required Min A to rise to EOB, discussed independent purchase of a bedrail to place under her mattress. She will need to practice using a step to get into the bed, used a chair PTA with spouse assist to return to bed. Pt did not want to practice this today due to legs feeling weak. Therefore session focus was placed on UB strengthening/endurance using 2# bar x15 reps 2 sets each exercise. OT played meaningful music to enhance affect. Also guided pt through gentle stretches targeting neck, back, and shoulders. Emphasized diaphragmatic breathing to help promote relaxation at rest. Pt taking rest breaks as needed, discussed importance of balancing exercise/rest during all tasks and structuring in rest breaks during ADL/IADL at home. Pt receptive to education. CGA for ambulatory toilet transfer using RW and CGA for 3/3 components of toileting with pt having +bladder void. She returned to bed at close of session, CGA for doffing clothing and then donning a hospital gown. Pt  left with all needs within reach and bed alarm set.   Therapy Documentation Precautions:  Precautions Precautions: Fall, Other (comment) Precaution Comments: colostomy, wound vac Restrictions Weight Bearing Restrictions: No  Pain: Pain Assessment Pain Score: 5  ADL: ADL Eating: Not assessed Grooming: Contact guard (able to complete a portion of oral care in standing, needed to finish in sitting due to decreased standing endurance) Where Assessed-Grooming: Standing at sink Upper Body Bathing: Supervision/safety Where Assessed-Upper Body Bathing: Edge of bed Lower Body Bathing: Minimal assistance Where Assessed-Lower Body Bathing: Edge of bed Upper Body Dressing: Minimal assistance Where Assessed-Upper Body Dressing: Edge of bed Lower Body Dressing: Moderate assistance Where Assessed-Lower Body Dressing: Edge of bed Toileting: Minimal assistance Where Assessed-Toileting: Glass blower/designer: Moderate assistance Toilet Transfer Method: Ambulating (RW) Toilet Transfer Equipment: Raised toilet seat Tub/Shower Transfer: Not assessed  Therapy/Group: Individual Therapy  Eniyah Eastmond A Cletus Paris 05/08/2021, 3:53 PM

## 2021-05-08 NOTE — Progress Notes (Signed)
Physical Therapy Session Note  Patient Details  Name: CARETHA RUMBAUGH MRN: [id number] Date of Birth: 24-Mar-1986  Today's Date: 05/08/2021 PT Individual Time: 0930-1038 PT Individual Time Calculation (min): 68 min   Short Term Goals: Week 1:  PT Short Term Goal 1 (Week 1): Pt will perform supine<>sit with min assist PT Short Term Goal 2 (Week 1): Pt will perform sit<>stands using LRAD with min assist PT Short Term Goal 3 (Week 1): Pt will perform stand pivot transfers using LRAD with min assist (including come to stand) PT Short Term Goal 4 (Week 1): Pt will ambulate at least 158f using LRAD with CGA PT Short Term Goal 5 (Week 1): Pt will ascend/descend 4 steps using B HRs with mod assist  Skilled Therapeutic Interventions/Progress Updates: Pt presented in bed agreeable to therapy. Pt just received pain meds as wound vac to be changed after therapy. Pt states did not have good morning as had knee buckling incident during previous session and then leaking ostomy. Pt becoming emotional with PTA providing emotional support and also asking if pt would any additional intervention (Chaplain etc), pt declines at this time. Ostomy has been changed and pt ready to participate. Beginning of session focused on development of HEP which can be performed at bed level over weekend. Pt able to participate in each exercises 2 x 10 bilaterally except for SLR which 1 set was tolerated. Please see below. Once completed pt stating need for bathroom however does not feel she can ambulate to toilet. PTA brought BSC adjacent to bed. Performed supine to sit with CGA and increased time. Performed [facility] and no instability noted (+ urinary void). Pt able to come to stand and perform peri-care with supervision. Pt then returned to EOB CGA. Pt used gait belt to assist LE onto bed and was able to perform lateral bridge to shift to middle of bed. Pt left in bed at end of session with bed alarm on, call bell  within reach and needs met.   Access Code: [email or web address] URL: [email or web address] Date: 05/08/2021 Prepared by: RKarlyne GreenspanDeChalus  Exercises Supine Quadricep Sets - 1 x daily - 7 x weekly - 3 sets - 10 reps Supine Heel Slide - 1 x daily - 7 x weekly - 3 sets - 10 reps Supine Hip Abduction - 1 x daily - 7 x weekly - 3 sets - 10 reps Small Range Straight Leg Raise - 1 x daily - 7 x weekly - 3 sets - 10 reps Supine Short Arc Quad - 1 x daily - 7 x weekly - 3 sets - 10 reps Bent Knee Fallouts - 1 x daily - 7 x weekly - 3 sets - 10 reps      Therapy Documentation Precautions:  Precautions Precautions: Fall, Other (comment) Precaution Comments: colostomy, wound vac Restrictions Weight Bearing Restrictions: No General: PT Missed Treatment Reason:  (overlap with previous PT session and nsg) Vital Signs: Therapy Vitals Temp: 98.1 F (36.7 C) Temp Source: Oral Pulse Rate: (!) 104 Resp: 18 BP: 134/85 Patient Position (if appropriate): Lying Oxygen Therapy SpO2: 100 % O2 Device: Room Air Pain: Pain Assessment Pain Score: 5  Mobility:   Locomotion :    Trunk/Postural Assessment :    Balance:   Exercises:   Other Treatments:      Therapy/Group: Individual Therapy  Lexington Devine 05/08/2021, 4:04 PM

## 2021-05-08 NOTE — Progress Notes (Signed)
PROGRESS NOTE   Subjective/Complaints:  Pt reports has been experimenting with  her food/intake to get right  bowel consistency.  Had little white dots on arms and legs  that were really itching- cleared with benadryl.  Still sore, but when takes pain meds with muscle relaxants, it works really well- doesn't want to change meds.    ROS:    Pt denies SOB, abd pain, CP, N/V/C/D, and vision changes     Objective:   No results found. Recent Labs    05/07/21 0511  WBC 9.5  HGB 9.9*  HCT 30.3*  PLT 597*   Recent Labs    05/07/21 0511  NA 136  K 4.0  CL 99  CO2 26  GLUCOSE 92  BUN 10  CREATININE 0.52  CALCIUM 8.8*    Intake/Output Summary (Last 24 hours) at 05/08/2021 0841 Last data filed at 05/08/2021 0824 Gross per 24 hour  Intake 600 ml  Output --  Net 600 ml        Physical Exam: Vital Signs Blood pressure (!) 126/94, pulse 100, temperature 98.1 F (36.7 C), resp. rate 16, height 5\' 3"  (1.6 m), weight 92.5 kg, last menstrual period 04/10/2021, SpO2 100 %.      General: awake, alert, appropriate, sitting up in bed;  NAD HENT: conjugate gaze; oropharynx moist CV: regular rhythm and borderline tachycardic  rate; no JVD Pulmonary: CTA B/L; no W/R/R- good air movement GI: soft, NT, ND, (+)BS- VAC in place; colostomy noted- thinner pasty stool in bag. Psychiatric: appropriate Neurological: Ox3; on phone with mother Skin: intact except for abd wounds as above.  Neurologic: motor strength is 5/5 in bilateral deltoid, bicep, tricep, grip,4- hip flexor, knee extensors, ankle dorsiflexor and plantar flexor Sensory exam normal sensation to light touch  in bilateral upper and lower extremities   Musculoskeletal: Full range of motion in all 4 extremities. No joint swelling   Assessment/Plan: 1. Functional deficits which require 3+ hours per day of interdisciplinary therapy in a comprehensive inpatient  rehab setting. Physiatrist is providing close team supervision and 24 hour management of active medical problems listed below. Physiatrist and rehab team continue to assess barriers to discharge/monitor patient progress toward functional and medical goals  Care Tool:  Bathing    Body parts bathed by patient: Right arm, Left arm, Chest, Abdomen, Front perineal area, Buttocks, Right upper leg, Left upper leg, Face, Right lower leg, Left lower leg   Body parts bathed by helper: Right lower leg, Left lower leg     Bathing assist Assist Level: Contact Guard/Touching assist     Upper Body Dressing/Undressing Upper body dressing   What is the patient wearing?: Dress    Upper body assist Assist Level: Contact Guard/Touching assist    Lower Body Dressing/Undressing Lower body dressing      What is the patient wearing?: Underwear/pull up     Lower body assist Assist for lower body dressing: Contact Guard/Touching assist     Toileting Toileting    Toileting assist Assist for toileting: Contact Guard/Touching assist     Transfers Chair/bed transfer  Transfers assist     Chair/bed transfer assist level: Contact Guard/Touching assist  Chair/bed transfer assistive device: Museum/gallery exhibitions officer assist   Ambulation activity did not occur: Safety/medical concerns (requires use of RW, which pt did not use at baseline and pt reports feeling unsafe to attempt gait without RW today)  Assist level: Contact Guard/Touching assist Assistive device: Walker-rolling Max distance: 200   Walk 10 feet activity   Assist  Walk 10 feet activity did not occur: Safety/medical concerns  Assist level: Contact Guard/Touching assist Assistive device: Walker-rolling   Walk 50 feet activity   Assist Walk 50 feet with 2 turns activity did not occur: Safety/medical concerns  Assist level: Contact Guard/Touching assist Assistive device: Walker-rolling    Walk 150  feet activity   Assist Walk 150 feet activity did not occur: Safety/medical concerns  Assist level: Contact Guard/Touching assist Assistive device: Walker-rolling    Walk 10 feet on uneven surface  activity   Assist Walk 10 feet on uneven surfaces activity did not occur: Safety/medical concerns         Wheelchair     Assist Is the patient using a wheelchair?: Yes Type of Wheelchair: Manual    Wheelchair assist level: Dependent - Patient 0%      Wheelchair 50 feet with 2 turns activity    Assist        Assist Level: Dependent - Patient 0%   Wheelchair 150 feet activity     Assist      Assist Level: Dependent - Patient 0%   Blood pressure (!) 126/94, pulse 100, temperature 98.1 F (36.7 C), resp. rate 16, height 5\' 3"  (1.6 m), weight 92.5 kg, last menstrual period 04/10/2021, SpO2 100 %.    Medical Problem List and Plan: 1.  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  Status post laparoscopic-assisted Hartmann left colectomy/end colostomy, small bowel resection, left retroperitoneal/flank abscess drainage 04/15/2021.  JP drain removed 04/28/2021.  Colostomy care as directed/wound [facility] Monday Wednesday Friday.  Plan outpatient follow-up medical oncology for systemic chemotherapy             -patient may not shower til VAC removed             -ELOS/Goals: 7-10d  Con't CIR- PT and OT-  2.  impaired mobility: vascular ultrasound reviewed and negative for clot -DVT/anticoagulation:  Mechanical: Antiembolism stockings, thigh (TED hose) Bilateral lower extremities.   12/1- will start Lovenox since Hb stable. Will recheck labs Monday- have surgery OK.              -antiplatelet therapy: N/A 3. Pain: continue Voltaren gel 4 times daily, oxycodone/Robaxin as needed  12/2- reminded pt to take muscle relaxant and pain meds- esp for VAC change.  4. Mood: Ativan as needed  12/2- con't Ativan prn- not taking a lot.              -antipsychotic  agents: N/A 5. Neuropsych: This patient is capable of making decisions on her own behalf. 6. Skin/Wound Care: Routine skin checks 7. Fluids/Electrolytes/Nutrition: Routine in and outs with follow-up chemistries 8.  Acute blood loss anemia.  Last transfusion 11/19.  Hgb 10.8 on 11/25, repeat Monday morning   11/28- Hb down to 10 from 10.8- will recheck Thursday.   12/1- Hb 9.9- stable- con't regimen and start Lovenox. As above.  9.  Septic shock.  Weaned from pressors 10.  Acute respiratory failure.  Extubated 11/10.  Monitor oxygen saturations. 11.  History of inappropriate sinus tachycardia.  Followed by cardiology service Dr. Gardiner Rhyme.  Continue Lopressor 25 mg twice daily  12/1- rate 90s-low 100s- con't regimen  12/2- BP soft, but heart rate 80s-100s- don't want to increase B blocker due to lower BP- will monitor 12.  Anasarca with moderate protein malnutrtion.  Related to hypoalbuminemia and aggressive volume repletion.  Continue Lasix.    12/1- Alb up to 2.6- will con't to monitor- low was 2.3. 13.  Obesity.  BMI 39.99.  Dietary follow-up 14.  Leukocytosis. resolved completes Cipro and Flagyl today.  Follow-up per general surgery  12/1- WBC 9.5- stable 15. Hypokalemia: repeat potassium tomorrow morning  11/28- will replete K+ 40 mEq x2 and recheck tomorrow. Since on Lasix, will order 20 mEq daily as of tomorrow.   11/29- K+ 3.6- will recheck Thursday-   12/1- K+ 4.0- will con't regimen and recheck Monday.   LOS: 7 days A FACE TO FACE EVALUATION WAS PERFORMED  Vu Liebman 05/08/2021, 8:41 AM

## 2021-05-08 NOTE — Progress Notes (Signed)
Physical Therapy Session Note  Patient Details  Name: Mikayla Rios MRN: [id number] Date of Birth: 09-12-1985  Today's Date: 05/08/2021 PT Individual Time: 0800-0928 PT Individual Time Calculation (min): 88 min   Short Term Goals: Week 1:  PT Short Term Goal 1 (Week 1): Pt will perform supine<>sit with min assist PT Short Term Goal 2 (Week 1): Pt will perform sit<>stands using LRAD with min assist PT Short Term Goal 3 (Week 1): Pt will perform stand pivot transfers using LRAD with min assist (including come to stand) PT Short Term Goal 4 (Week 1): Pt will ambulate at least 139ft using LRAD with CGA PT Short Term Goal 5 (Week 1): Pt will ascend/descend 4 steps using B HRs with mod assist  Skilled Therapeutic Interventions/Progress Updates:    pt received in bed and agreeable to therapy. Pt reports no pain, but premedicated. Supine > sit with supervision and VC for technique. Pt required two attempts but stood from low bed with VC for anterior weight shift and light min A to facilitate. Stand pivot transfer with supervision and RW, therapist managed wound vac. Pt then completed morning hygiene at w/c level mod I.   Pt ambulated to therapy gym, ~280 ft, with RW, supervision and assist to manage wound vac. VC to decr reliance on UE throughout. Pt then transitioned to step ups with 6" step. Pt managed step x 2 with RLE and min A. When attempting with LLE, pt's knee buckled and she landed on her R knee on the step. Recovered with +2 assist d/t weakness and emotional distress. Pt was flustered but reported no incr pain or injury.  Transitioned to // bars and performed squats with BUE support and CGA at knee. Pt's RLE was shaky but showed no further signs of buckling. At this time, noted that ostomy bag had become unsealed and needed to be changed. Pt was transported back to room and assisted with hygiene and dressing after change. Pt managed ostomy bag change with supervision. Pt returned to bed with RW  and CGA, sit>supine with mod A to manage BLE. Pt was left with all needs in reach and alarm active, nurse present to provide medication prior to wound vac change.   Therapy Documentation Precautions:  Precautions Precautions: Fall, Other (comment) Precaution Comments: colostomy, wound vac Restrictions Weight Bearing Restrictions: No General:   Vital Signs: Therapy Vitals Temp: 98.1 F (36.7 C) Pulse Rate: 100 Resp: 16 BP: (!) 126/94 Patient Position (if appropriate): Lying Oxygen Therapy SpO2: 100 % O2 Device: Room Air Pain:   Mobility:   Locomotion :    Trunk/Postural Assessment :    Balance:   Exercises:   Other Treatments:      Therapy/Group: Individual Therapy  [name] 05/08/2021, 7:49 AM

## 2021-05-08 NOTE — Progress Notes (Signed)
Occupational Therapy Session Note  Patient Details  Name: Mikayla Rios MRN: [id number] Date of Birth: Mar 17, 1986  Today's Date: 05/09/2021 OT Individual Time: 2863-8177 OT Individual Time Calculation (min): 44 min    Short Term Goals: Week 1:  OT Short Term Goal 1 (Week 1): Pt will complete toileting with Min A for sit<stands OT Short Term Goal 2 (Week 1): Pt will complete LB dressing with Min A using AE as needed OT Short Term Goal 3 (Week 1): Pt will complete oral care while standing at the sink without seated rest to increase activity tolerance  Skilled Therapeutic Interventions/Progress Updates:    Pt greeted in bed and premedicated for pain, agreeable to session and ADL needs met. Setup for supine<sit with HOB elevated and close supervision for short distance ambulatory transfer to the w/c using RW. Note that pt donned her shirt and shoes with setup beforehand. To work on [employer] strengthening/endurance for functional carryover during self care tasks, pt self propelled her w/c in the atrium. Vcs for sharp turns and symmetry of UE movement. 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 Pt required 2 rest breaks during mobility. Afterwards she was escorted back to the room and completed another ambulatory transfer back to bed in the manner as stated above. She doffed shirt and donned a hospital gown and returned to bed without assistance! We celebrated! Pt assisted in sidelying position due to some back pain at this point in session. She remained in bed at close of session, all needs within reach and bed alarm set.   Therapy Documentation Precautions:  Precautions Precautions: Fall, Other (comment) Precaution Comments: colostomy, wound vac Restrictions Weight Bearing Restrictions: No  ADL: ADL Eating: Not assessed Grooming: Contact guard (able to complete a portion of oral care in standing, needed to finish in sitting due to  decreased standing endurance) Where Assessed-Grooming: Standing at sink Upper Body Bathing: Supervision/safety Where Assessed-Upper Body Bathing: Edge of bed Lower Body Bathing: Minimal assistance Where Assessed-Lower Body Bathing: Edge of bed Upper Body Dressing: Minimal assistance Where Assessed-Upper Body Dressing: Edge of bed Lower Body Dressing: Moderate assistance Where Assessed-Lower Body Dressing: Edge of bed Toileting: Minimal assistance Where Assessed-Toileting: Glass blower/designer: Moderate assistance Toilet Transfer Method: Ambulating (RW) Toilet Transfer Equipment: Raised toilet seat Tub/Shower Transfer: Not assessed  Therapy/Group: Individual Therapy  Red Mandt A Daelyn Pettaway 05/09/2021, 4:31 PM

## 2021-05-09 DIAGNOSIS — E876 Hypokalemia: Secondary | ICD-10-CM

## 2021-05-09 DIAGNOSIS — R000 Tachycardia, unspecified: Secondary | ICD-10-CM

## 2021-05-09 DIAGNOSIS — D62 Acute posthemorrhagic anemia: Secondary | ICD-10-CM

## 2021-05-09 NOTE — Progress Notes (Signed)
Occupational Therapy Session Note  Patient Details  Name: Mikayla Rios MRN: [id number] Date of Birth: 1985-10-02  Today's Date: 05/10/2021 OT Group Time: 1115-1200 OT Group Time Calculation (min): 45 min 15 minutes missed due to nursing care  Skilled Therapeutic Interventions/Progress Updates:    Pt engaged in therapeutic w/c level dance group focusing on patient choice, UE/LE strengthening, salience, activity tolerance, and social participation. Pt was guided through various dance-based exercises involving UEs/LEs and trunk. All music was selected by group members. Emphasis placed on activity tolerance and UB strengthening. Pt arrived a little late to group due to toileting needs. When she arrived pt participated well at seated level, seemed to have some stomach discomfort confirmed by pt. She planned to ask nursing for pain medicine after lunch today. Pt was returned to the room by PT.    Therapy Documentation Precautions:  Precautions Precautions: Fall, Other (comment) Precaution Comments: colostomy, wound vac Restrictions Weight Bearing Restrictions: No  Vital Signs: Therapy Vitals Temp: (!) 97.5 F (36.4 C) Temp Source: Oral Pulse Rate: (!) 107 Resp: 18 BP: 135/89 Patient Position (if appropriate): Lying Oxygen Therapy SpO2: 100 % O2 Device: Room Air Pain: Pain Assessment Pain Score: 3   Therapy/Group: Group Therapy  Mikayla Rios A Deondre Marinaro 05/10/2021, 4:37 PM

## 2021-05-09 NOTE — Progress Notes (Signed)
PROGRESS NOTE   Subjective/Complaints: Patient seen sitting up in bed this morning.  She states she slept fairly overnight due to some trouble getting comfortable.  ROS: Denies CP, SOB, N/V/D  Objective:   No results found. Recent Labs    05/07/21 0511  WBC 9.5  HGB 9.9*  HCT 30.3*  PLT 597*    Recent Labs    05/07/21 0511  NA 136  K 4.0  CL 99  CO2 26  GLUCOSE 92  BUN 10  CREATININE 0.52  CALCIUM 8.8*     Intake/Output Summary (Last 24 hours) at 05/09/2021 1102 Last data filed at 05/09/2021 0837 Gross per 24 hour  Intake 480 ml  Output 151 ml  Net 329 ml         Physical Exam: Vital Signs Blood pressure 130/77, pulse (!) 105, temperature 98.3 F (36.8 C), temperature source Oral, resp. rate 18, height 5\' 3"  (1.6 m), weight 93.2 kg, last menstrual period 04/10/2021, SpO2 95 %. Constitutional: No distress . Vital signs reviewed.  Pleasant obese. HENT: Normocephalic.  Atraumatic. Eyes: EOMI. No discharge. Cardiovascular: No JVD.  Tachycardia. Respiratory: Normal effort.  No stridor.  Bilateral clear to auscultation. GI: Non-distended.  BS +.  + Colostomy. Skin: Warm and dry.  + VAC. Psych: Normal mood.  Normal behavior. Musc: No edema in extremities.  No tenderness in extremities. Neuro: Alert Motor: Bilateral lower extremities: Grossly 4 -/5 throughout  Assessment/Plan: 1. Functional deficits which require 3+ hours per day of interdisciplinary therapy in a comprehensive inpatient rehab setting. Physiatrist is providing close team supervision and 24 hour management of active medical problems listed below. Physiatrist and rehab team continue to assess barriers to discharge/monitor patient progress toward functional and medical goals  Care Tool:  Bathing    Body parts bathed by patient: Right arm, Left arm, Chest, Abdomen, Front perineal area, Buttocks, Right upper leg, Left upper leg, Face,  Right lower leg, Left lower leg   Body parts bathed by helper: Right lower leg, Left lower leg     Bathing assist Assist Level: Contact Guard/Touching assist     Upper Body Dressing/Undressing Upper body dressing   What is the patient wearing?: Dress    Upper body assist Assist Level: Contact Guard/Touching assist    Lower Body Dressing/Undressing Lower body dressing      What is the patient wearing?: Underwear/pull up     Lower body assist Assist for lower body dressing: Contact Guard/Touching assist     Toileting Toileting    Toileting assist Assist for toileting: Contact Guard/Touching assist     Transfers Chair/bed transfer  Transfers assist     Chair/bed transfer assist level: Contact Guard/Touching assist Chair/bed transfer assistive device: Programmer, multimedia   Ambulation assist   Ambulation activity did not occur: Safety/medical concerns (requires use of RW, which pt did not use at baseline and pt reports feeling unsafe to attempt gait without RW today)  Assist level: Contact Guard/Touching assist Assistive device: Walker-rolling Max distance: 200   Walk 10 feet activity   Assist  Walk 10 feet activity did not occur: Safety/medical concerns  Assist level: Contact Guard/Touching assist Assistive device:  Walker-rolling   Walk 50 feet activity   Assist Walk 50 feet with 2 turns activity did not occur: Safety/medical concerns  Assist level: Contact Guard/Touching assist Assistive device: Walker-rolling    Walk 150 feet activity   Assist Walk 150 feet activity did not occur: Safety/medical concerns  Assist level: Contact Guard/Touching assist Assistive device: Walker-rolling    Walk 10 feet on uneven surface  activity   Assist Walk 10 feet on uneven surfaces activity did not occur: Safety/medical concerns         Wheelchair     Assist Is the patient using a wheelchair?: Yes Type of Wheelchair: Manual     Wheelchair assist level: Dependent - Patient 0%      Wheelchair 50 feet with 2 turns activity    Assist        Assist Level: Dependent - Patient 0%   Wheelchair 150 feet activity     Assist      Assist Level: Dependent - Patient 0%   Blood pressure 130/77, pulse (!) 105, temperature 98.3 F (36.8 C), temperature source Oral, resp. rate 18, height 5\' 3"  (1.6 m), weight 93.2 kg, last menstrual period 04/10/2021, SpO2 95 %.    Medical Problem List and Plan: 1.  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  Status post laparoscopic-assisted Hartmann left colectomy/end colostomy, small bowel resection, left retroperitoneal/flank abscess drainage 04/15/2021.  JP drain removed 04/28/2021.  Colostomy care as directed/wound [facility] Monday Wednesday Friday.  Plan outpatient follow-up medical oncology for systemic chemotherapy  Continue CIR 2.  impaired mobility: vascular ultrasound reviewed and negative for clot -DVT/anticoagulation:  Mechanical: Antiembolism stockings, thigh (TED hose) Bilateral lower extremities.   12/1- will start Lovenox since Hb stable.              -antiplatelet therapy: N/A 3. Pain: continue Voltaren gel 4 times daily, oxycodone/Robaxin as needed  Relatively controlled with meds on 12/3 4. Mood: Ativan as needed  12/2- con't Ativan prn- not taking a lot.              -antipsychotic agents: N/A 5. Neuropsych: This patient is capable of making decisions on her own behalf. 6. Skin/Wound Care: Routine skin checks 7. Fluids/Electrolytes/Nutrition: Routine in and outs with follow-up chemistries 8.  Acute blood loss anemia.  Last transfusion 11/19.    Hemoglobin 9.9 on 12/1, trending down, labs ordered for Monday 9.  Septic shock.  Weaned from pressors 10.  Acute respiratory failure.  Extubated 11/10.  Monitor oxygen saturations. 11.  History of inappropriate sinus tachycardia.  Followed by cardiology service Dr. Gardiner Rhyme.  Continue  Lopressor 25 mg twice daily Mildly elevated on 12/3 12.  Anasarca with moderate protein malnutrtion.  Related to hypoalbuminemia and aggressive volume repletion.  Continue Lasix.  12/1- Alb up to 2.6- will con't to monitor- low was 2.3. 13.  Obesity.  BMI 39.99.  Dietary follow-up 14.  Leukocytosis.  Resolved completed Cipro and Flagyl.  Follow-up per general surgery 15. Hypokalemia:   Since on Lasix, 20 mEq daily as of tomorrow.   Potassium 4.0 on 12/1, labs ordered for Monday  LOS: 8 days A FACE TO FACE EVALUATION WAS PERFORMED  Nishanth Mccaughan Lorie Phenix 05/09/2021, 11:02 AM

## 2021-05-09 NOTE — Progress Notes (Signed)
Physical Therapy Session Note  Patient Details  Name: Mikayla Rios MRN: [id number] Date of Birth: Mar 20, 1986  Today's Date: 05/09/2021 PT Individual Time: 7408-1448 PT Individual Time Calculation (min): 54 min   Short Term Goals: Week 1:  PT Short Term Goal 1 (Week 1): Pt will perform supine<>sit with min assist PT Short Term Goal 2 (Week 1): Pt will perform sit<>stands using LRAD with min assist PT Short Term Goal 3 (Week 1): Pt will perform stand pivot transfers using LRAD with min assist (including come to stand) PT Short Term Goal 4 (Week 1): Pt will ambulate at least 11ft using LRAD with CGA PT Short Term Goal 5 (Week 1): Pt will ascend/descend 4 steps using B HRs with mod assist  Skilled Therapeutic Interventions/Progress Updates:     Pt supine in bed at start of session - agreeable to PT tx. Reports some mild abdominal pain which she received pain medications for prior. Supine<>sit with HOB raised at supervision level - demonstrates appropriate understanding of log rolling and protecting her abdomen. Pt assisted in lower body dressing at setupA level - including pants, socks, and shoes - all while seated EOB. She request to toilet prior to leaving room, and due to urgency, we used BSC. Completed stand<>pivot transfer with RW and close supervision from lowered bed height to [facility]. Continent of bladder and pt able to complete frontal pericare without assist. Charted in flowsheets. Stand<>pivot to w/c in similar manner.   Transported in w/c to day room rehab gym for energy conservation. Completed ambulatory transfer with RW and supervision to [employer]. Required assist for placing feet into paddles due to hip flexor weakness. Completed a total of 10 minutes with workload 4, using x4 extremities. Pt reporting 13/20 fatigue on borg perceived exertion scale.   Pt then instructed in functional gait training with RW and supervision with PT assisting to manage wound vac. Primary gait deficits  include heavy reliance of UE support through RW with mildly flexed forward trunk with min VC for corrections.   Pt then instructed in repeated sit<>stands from w/c level - completed 1x10 with use of arm rests and supervision assist. VC needed to increase forward weight shift and "bowing" to facilitate. Progressed to 1x5 sit<>stands with emphasis on eccentric lowering and "hovering" over seat prior to initiating the next stand.   Pt returned to her room at conclusion of session - requested to return to bed due to fatigue. Ambulatory transfer within her room with supervision and RW. Required minA for RLE management during sit>supine with HOB flat. Able to reposition self without assist. Remained semi-reclined in bed with bed alarm on and all needs within reach at conclusion of session.  Therapy Documentation Precautions:  Precautions Precautions: Fall, Other (comment) Precaution Comments: colostomy, wound vac Restrictions Weight Bearing Restrictions: No General:    Therapy/Group: Individual Therapy  Alger Simons 05/09/2021, 7:37 AM

## 2021-05-10 DIAGNOSIS — R9431 Abnormal electrocardiogram [ECG] [EKG]: Secondary | ICD-10-CM

## 2021-05-10 MED ORDER — METOPROLOL TARTRATE 25 MG PO TABS
37.5000 mg | ORAL_TABLET | Freq: Two times a day (BID) | ORAL | Status: DC
  Administered 2021-05-10 – 2021-05-11 (×2): 37.5 mg via ORAL
  Filled 2021-05-10 (×2): qty 1

## 2021-05-10 NOTE — Progress Notes (Signed)
PROGRESS NOTE   Subjective/Complaints: Patient seen sitting up in bed this morning.  She states she slept well overnight.  She denies complaints.  ROS: Denies CP, SOB, N/V/D  Objective:   No results found. No results for input(s): WBC, HGB, HCT, PLT in the last 72 hours.  No results for input(s): NA, K, CL, CO2, GLUCOSE, BUN, CREATININE, CALCIUM in the last 72 hours.   Intake/Output Summary (Last 24 hours) at 05/10/2021 1717 Last data filed at 05/10/2021 1330 Gross per 24 hour  Intake 660 ml  Output 600 ml  Net 60 ml         Physical Exam: Vital Signs Blood pressure 135/89, pulse (!) 107, temperature (!) 97.5 F (36.4 C), temperature source Oral, resp. rate 18, height 5\' 3"  (1.6 m), weight 93.2 kg, last menstrual period 04/10/2021, SpO2 100 %. Constitutional: No distress . Vital signs reviewed. HENT: Normocephalic.  Atraumatic. Eyes: EOMI. No discharge. Cardiovascular: No JVD.  Tachycardia. Respiratory: Normal effort.  No stridor.  Bilateral clear to auscultation. GI: Non-distended.  BS +.  + Colostomy.   Skin: Warm and dry.  + VAC. Psych: Normal mood.  Normal behavior. Musc: No edema in extremities.  No tenderness in extremities. Neuro: Alert Motor: Bilateral lower extremities: Grossly 4 -/5 throughout, unchanged  Assessment/Plan: 1. Functional deficits which require 3+ hours per day of interdisciplinary therapy in a comprehensive inpatient rehab setting. Physiatrist is providing close team supervision and 24 hour management of active medical problems listed below. Physiatrist and rehab team continue to assess barriers to discharge/monitor patient progress toward functional and medical goals  Care Tool:  Bathing    Body parts bathed by patient: Right arm, Left arm, Chest, Abdomen, Front perineal area, Buttocks, Right upper leg, Left upper leg, Face, Right lower leg, Left lower leg   Body parts bathed by  helper: Right lower leg, Left lower leg     Bathing assist Assist Level: Contact Guard/Touching assist     Upper Body Dressing/Undressing Upper body dressing   What is the patient wearing?: Dress    Upper body assist Assist Level: Contact Guard/Touching assist    Lower Body Dressing/Undressing Lower body dressing      What is the patient wearing?: Underwear/pull up     Lower body assist Assist for lower body dressing: Contact Guard/Touching assist     Toileting Toileting    Toileting assist Assist for toileting: Contact Guard/Touching assist     Transfers Chair/bed transfer  Transfers assist     Chair/bed transfer assist level: Contact Guard/Touching assist Chair/bed transfer assistive device: Programmer, multimedia   Ambulation assist   Ambulation activity did not occur: Safety/medical concerns (requires use of RW, which pt did not use at baseline and pt reports feeling unsafe to attempt gait without RW today)  Assist level: Contact Guard/Touching assist Assistive device: Walker-rolling Max distance: 200   Walk 10 feet activity   Assist  Walk 10 feet activity did not occur: Safety/medical concerns  Assist level: Contact Guard/Touching assist Assistive device: Walker-rolling   Walk 50 feet activity   Assist Walk 50 feet with 2 turns activity did not occur: Safety/medical concerns  Assist level:  Contact Guard/Touching assist Assistive device: Walker-rolling    Walk 150 feet activity   Assist Walk 150 feet activity did not occur: Safety/medical concerns  Assist level: Contact Guard/Touching assist Assistive device: Walker-rolling    Walk 10 feet on uneven surface  activity   Assist Walk 10 feet on uneven surfaces activity did not occur: Safety/medical concerns         Wheelchair     Assist Is the patient using a wheelchair?: Yes Type of Wheelchair: Manual    Wheelchair assist level: Dependent - Patient 0%       Wheelchair 50 feet with 2 turns activity    Assist        Assist Level: Dependent - Patient 0%   Wheelchair 150 feet activity     Assist      Assist Level: Dependent - Patient 0%   Blood pressure 135/89, pulse (!) 107, temperature (!) 97.5 F (36.4 C), temperature source Oral, resp. rate 18, height 5\' 3"  (1.6 m), weight 93.2 kg, last menstrual period 04/10/2021, SpO2 100 %.    Medical Problem List and Plan: 1.  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  Status post laparoscopic-assisted Hartmann left colectomy/end colostomy, small bowel resection, left retroperitoneal/flank abscess drainage 04/15/2021.  JP drain removed 04/28/2021.  Colostomy care as directed/wound [facility] Monday Wednesday Friday.  Plan outpatient follow-up medical oncology for systemic chemotherapy  Continue CIR 2.  impaired mobility: vascular ultrasound reviewed and negative for clot -DVT/anticoagulation:  Mechanical: Antiembolism stockings, thigh (TED hose) Bilateral lower extremities.   Started Lovenox since Hb stable.              -antiplatelet therapy: N/A 3. Pain: continue Voltaren gel 4 times daily, oxycodone/Robaxin as needed  Relatively controlled with meds on 12/4 4. Mood: Ativan as needed  12/2- con't Ativan prn- not taking a lot.              -antipsychotic agents: N/A 5. Neuropsych: This patient is capable of making decisions on her own behalf. 6. Skin/Wound Care: Routine skin checks 7. Fluids/Electrolytes/Nutrition: Routine in and outs with follow-up chemistries 8.  Acute blood loss anemia.  Last transfusion 11/19.    Hemoglobin 9.9 on 12/1, trending down, labs ordered for tomorrow 9.  Septic shock.  Weaned from pressors 10.  Acute respiratory failure.  Extubated 11/10.  Monitor oxygen saturations. 11.  History of inappropriate sinus tachycardia.  Followed by cardiology service Dr. Gardiner Rhyme.  Continue Lopressor 25 mg twice daily Mildly elevated on 12/3 12.   Anasarca with moderate protein malnutrtion.  Related to hypoalbuminemia and aggressive volume repletion.  Continue Lasix.  12/1- Alb up to 2.6- will con't to monitor- low was 2.3. 13.  Obesity.  BMI 39.99.  Dietary follow-up 14.  Leukocytosis.  Resolved completed Cipro and Flagyl.  Follow-up per general surgery 15. Hypokalemia:   Since on Lasix, 20 mEq daily as of tomorrow.   Potassium 4.0 on 12/1, labs ordered for tomorrow 16.  Sinus tachycardia  ECG from 04/2021 reviewed showing sinus tachycardia with prolonged QTC, repeat ECG ordered  Metoprolol increased to 37.5 twice daily on 5/3  LOS: 9 days A FACE TO FACE EVALUATION WAS PERFORMED  Jemal Miskell Lorie Phenix 05/10/2021, 5:17 PM

## 2021-05-11 ENCOUNTER — Inpatient Hospital Stay: Payer: BC Managed Care – PPO | Admitting: Hematology

## 2021-05-11 LAB — COMPREHENSIVE METABOLIC PANEL
ALT: 9 U/L (ref 0–44)
AST: 14 U/L — ABNORMAL LOW (ref 15–41)
Albumin: 2.6 g/dL — ABNORMAL LOW (ref 3.5–5.0)
Alkaline Phosphatase: 35 U/L — ABNORMAL LOW (ref 38–126)
Anion gap: 10 (ref 5–15)
BUN: 7 mg/dL (ref 6–20)
CO2: 30 mmol/L (ref 22–32)
Calcium: 8.9 mg/dL (ref 8.9–10.3)
Chloride: 97 mmol/L — ABNORMAL LOW (ref 98–111)
Creatinine, Ser: 0.62 mg/dL (ref 0.44–1.00)
GFR, Estimated: >60 mL/min (ref >60)
Glucose, Bld: 93 mg/dL (ref 70–99)
Potassium: 3.5 mmol/L (ref 3.5–5.1)
Sodium: 137 mmol/L (ref 135–145)
Total Bilirubin: 0.5 mg/dL (ref 0.3–1.2)
Total Protein: 6.5 g/dL (ref 6.5–8.1)

## 2021-05-11 LAB — CBC WITH DIFFERENTIAL/PLATELET
Abs Immature Granulocytes: 0.03 10*3/uL (ref 0.00–0.07)
Basophils Absolute: 0 10*3/uL (ref 0.0–0.1)
Basophils Relative: 0 %
Eosinophils Absolute: 0.3 10*3/uL (ref 0.0–0.5)
Eosinophils Relative: 4 %
HCT: 32.3 % — ABNORMAL LOW (ref 36.0–46.0)
Hemoglobin: 10.4 g/dL — ABNORMAL LOW (ref 12.0–15.0)
Immature Granulocytes: 0 %
Lymphocytes Relative: 24 %
Lymphs Abs: 2 10*3/uL (ref 0.7–4.0)
MCH: 28.1 pg (ref 26.0–34.0)
MCHC: 32.2 g/dL (ref 30.0–36.0)
MCV: 87.3 fL (ref 80.0–100.0)
Monocytes Absolute: 0.6 10*3/uL (ref 0.1–1.0)
Monocytes Relative: 7 %
Neutro Abs: 5.3 10*3/uL (ref 1.7–7.7)
Neutrophils Relative %: 65 %
Platelets: 557 10*3/uL — ABNORMAL HIGH (ref 150–400)
RBC: 3.7 MIL/uL — ABNORMAL LOW (ref 3.87–5.11)
RDW: 14.1 % (ref 11.5–15.5)
WBC: 8.2 10*3/uL (ref 4.0–10.5)
nRBC: 0 % (ref 0.0–0.2)

## 2021-05-11 MED ORDER — DICLOFENAC SODIUM 1 % EX GEL
4.0000 g | Freq: Four times a day (QID) | CUTANEOUS | Status: DC | PRN
  Filled 2021-05-11: qty 100

## 2021-05-11 MED ORDER — METOPROLOL TARTRATE 50 MG PO TABS
50.0000 mg | ORAL_TABLET | Freq: Two times a day (BID) | ORAL | Status: DC
  Administered 2021-05-11 – 2021-05-15 (×8): 50 mg via ORAL
  Filled 2021-05-11 (×9): qty 1

## 2021-05-11 NOTE — Progress Notes (Signed)
Physical Therapy Session Note  Patient Details  Name: Mikayla Rios MRN: [id number] Date of Birth: [birth year]  Today's Date: 05/11/2021 PT Individual Time: 1030-1055 PT Individual Time Calculation (min): 25 min   Short Term Goals: Week 1:  PT Short Term Goal 1 (Week 1): Pt will perform supine<>sit with min assist PT Short Term Goal 2 (Week 1): Pt will perform sit<>stands using LRAD with min assist PT Short Term Goal 3 (Week 1): Pt will perform stand pivot transfers using LRAD with min assist (including come to stand) PT Short Term Goal 4 (Week 1): Pt will ambulate at least 155ft using LRAD with CGA PT Short Term Goal 5 (Week 1): Pt will ascend/descend 4 steps using B HRs with mod assist  Skilled Therapeutic Interventions/Progress Updates:    Pt received seated in bed, agreeable to PT session. Pt has some abdominal pain with transitional movements that improves at rest. Supine to sit with Supervision with HOB elevated and use of bedrail. Pt is setup A to to don shoes while seated EOB. Sit to stand with CGA to RW during session. Stand pivot transfer to [facility] with RW and CGA. Pt is setup A for emptying ostomy bag while seated on BSC, setup A for pericare following urination. Sit to stand with CGA to RW, pt is Supervision for standing balance during clothing management. Ambulation x 450 ft with RW and Supervision for balance for endurance training, one standing rest break. Pt returned to bed at end of session, Supervision for bed mobility with use of bedrail and utilizes pulling on pants to lift LE back into bed. Pt left seated in bed with needs in reach at end of session.  Therapy Documentation Precautions:  Precautions Precautions: Fall, Other (comment) Precaution Comments: colostomy, wound vac Restrictions Weight Bearing Restrictions: No   Therapy/Group: Individual Therapy   Excell Seltzer, PT, DPT, CSRS  05/11/2021, 12:47 PM

## 2021-05-11 NOTE — Progress Notes (Signed)
Occupational Therapy Weekly Progress Note  Patient Details  Name: Mikayla Rios MRN: [id number] Date of Birth: 12/22/1985  Beginning of progress report period: May 03, 2021 End of progress report period: May 11, 2021  Today's Date: 05/11/2021 OT Individual Time: 1324-4010 OT Individual Time Calculation (min): 55 min    Patient has met 2 of 3 short term goals.  Pt is progressing toward OT goals and is CGA for stand pivot transfers and functional mobility for ADL tasks, sink level bathing with CGA for standing portions, toileting tasks for urinating with CGA, and pt has been emptying colostomy bag with Supervision/Set up. Pt has been limited 2/2 fatigue and decreased endurance, but is motivated and is an active participant in OT session. Initiated family training with husband, plan for more formal training prior to DC.  Patient continues to demonstrate the following deficits: muscle weakness, decreased cardiorespiratoy endurance, decreased coordination and decreased motor planning, decreased motor planning, and decreased sitting balance, decreased standing balance, decreased postural control, and decreased balance strategies and therefore will continue to benefit from skilled OT intervention to enhance overall performance with BADL and Reduce care partner burden.  Patient progressing toward long term goals..  Continue plan of care.  OT Short Term Goals Week 1:  OT Short Term Goal 1 (Week 1): Pt will complete toileting with Min A for sit<stands OT Short Term Goal 1 - Progress (Week 1): Met OT Short Term Goal 2 (Week 1): Pt will complete LB dressing with Min A using AE as needed OT Short Term Goal 2 - Progress (Week 1): Met OT Short Term Goal 3 (Week 1): Pt will complete oral care while standing at the sink without seated rest to increase activity tolerance OT Short Term Goal 3 - Progress (Week 1): Progressing toward goal Week 2:  OT Short Term Goal 1 (Week 2): STGs = LTGs 2/2 ELOS at  Supervision  Skilled Therapeutic Interventions/Progress Updates:    Pt greeted at time of session sitting up on EOB, just finished PT session. No pain but fatigued, willing to attempt OT session despite fatigue. Pt relayed that she is having some itching still, relayed to nursing and nursing provided benadryl during session. Nursing also stating that they may try hypoallergenic sheets in attempt to decrease itching sensation. Stand pivot bed > wheelchair CGA with RW and therapist managing wound vac throughout transfers. Wheelchair transport to [employer] and focused on BLE strengthening and standing balance by standing at RW and performing 2x10-15 of the following: high knees with 1# ankle weights and calf raises. 3 rounds of dynamic standing tapping various colored cones, encouraging hip and knee flexion to promote precursor skills for stairs per pt request as this is what she is most nervous about. Transported back to room, transferred to bed level CGA, hand off to nursing for wound vac change.    Therapy Documentation Precautions:  Precautions Precautions: Fall, Other (comment) Precaution Comments: colostomy, wound vac Restrictions Weight Bearing Restrictions: No     Therapy/Group: Individual Therapy  Viona Gilmore 05/11/2021, 7:16 AM

## 2021-05-11 NOTE — Progress Notes (Signed)
PROGRESS NOTE   Subjective/Complaints: No pain c/os, feeling more comfortable handling colostomy  Labs reviewed, bmet normal, CBC stable ROS: Denies CP, SOB, N/V/D  Objective:   No results found. Recent Labs    05/11/21 0529  WBC 8.2  HGB 10.4*  HCT 32.3*  PLT 557*    Recent Labs    05/11/21 0529  NA 137  K 3.5  CL 97*  CO2 30  GLUCOSE 93  BUN 7  CREATININE 0.62  CALCIUM 8.9     Intake/Output Summary (Last 24 hours) at 05/11/2021 1011 Last data filed at 05/11/2021 0700 Gross per 24 hour  Intake 600 ml  Output 600 ml  Net 0 ml         Physical Exam: Vital Signs Blood pressure 123/89, pulse 93, temperature 98.2 F (36.8 C), temperature source Oral, resp. rate 18, height 5\' 3"  (1.6 m), weight 93.2 kg, SpO2 100 %.  General: No acute distress Mood and affect are appropriate Heart: Regular rate and rhythm no rubs murmurs or extra sounds Lungs: Clear to auscultation, breathing unlabored, no rales or wheezes Abdomen: Positive bowel sounds, soft nontender to palpation, nondistended Extremities: No clubbing, cyanosis, or edema Skin: No evidence of breakdown, no evidence of rash   Neuro: Alert Motor: Bilateral lower extremities: Grossly 4 -/5 throughout, unchanged  Assessment/Plan: 1. Functional deficits which require 3+ hours per day of interdisciplinary therapy in a comprehensive inpatient rehab setting. Physiatrist is providing close team supervision and 24 hour management of active medical problems listed below. Physiatrist and rehab team continue to assess barriers to discharge/monitor patient progress toward functional and medical goals  Care Tool:  Bathing    Body parts bathed by patient: Right arm, Left arm, Chest, Abdomen, Front perineal area, Buttocks, Right upper leg, Left upper leg, Face, Right lower leg, Left lower leg   Body parts bathed by helper: Right lower leg, Left lower leg      Bathing assist Assist Level: Contact Guard/Touching assist     Upper Body Dressing/Undressing Upper body dressing   What is the patient wearing?: Dress    Upper body assist Assist Level: Contact Guard/Touching assist    Lower Body Dressing/Undressing Lower body dressing      What is the patient wearing?: Underwear/pull up     Lower body assist Assist for lower body dressing: Contact Guard/Touching assist     Toileting Toileting    Toileting assist Assist for toileting: Contact Guard/Touching assist     Transfers Chair/bed transfer  Transfers assist     Chair/bed transfer assist level: Contact Guard/Touching assist Chair/bed transfer assistive device: Programmer, multimedia   Ambulation assist   Ambulation activity did not occur: Safety/medical concerns (requires use of RW, which pt did not use at baseline and pt reports feeling unsafe to attempt gait without RW today)  Assist level: Contact Guard/Touching assist Assistive device: Walker-rolling Max distance: 200   Walk 10 feet activity   Assist  Walk 10 feet activity did not occur: Safety/medical concerns  Assist level: Contact Guard/Touching assist Assistive device: Walker-rolling   Walk 50 feet activity   Assist Walk 50 feet with 2 turns activity did not  occur: Safety/medical concerns  Assist level: Contact Guard/Touching assist Assistive device: Walker-rolling    Walk 150 feet activity   Assist Walk 150 feet activity did not occur: Safety/medical concerns  Assist level: Contact Guard/Touching assist Assistive device: Walker-rolling    Walk 10 feet on uneven surface  activity   Assist Walk 10 feet on uneven surfaces activity did not occur: Safety/medical concerns         Wheelchair     Assist Is the patient using a wheelchair?: Yes Type of Wheelchair: Manual    Wheelchair assist level: Dependent - Patient 0%      Wheelchair 50 feet with 2 turns  activity    Assist        Assist Level: Dependent - Patient 0%   Wheelchair 150 feet activity     Assist      Assist Level: Dependent - Patient 0%   Blood pressure 123/89, pulse 93, temperature 98.2 F (36.8 C), temperature source Oral, resp. rate 18, height 5\' 3"  (1.6 m), weight 93.2 kg, SpO2 100 %.    Medical Problem List and Plan: 1.  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  Status post laparoscopic-assisted Hartmann left colectomy/end colostomy, small bowel resection, left retroperitoneal/flank abscess drainage 04/15/2021.  JP drain removed 04/28/2021.  Colostomy care as directed/wound [facility] Monday Wednesday Friday.  Plan outpatient follow-up medical oncology for systemic chemotherapy  Continue CIR PT, OT,  2.  impaired mobility: vascular ultrasound reviewed and negative for clot -DVT/anticoagulation:  Mechanical: Antiembolism stockings, thigh (TED hose) Bilateral lower extremities.   Started Lovenox since Hb stable.              -antiplatelet therapy: N/A 3. Pain: continue Voltaren gel 4 times daily, oxycodone/Robaxin as needed  Relatively controlled with meds on 12/5 4. Mood: Ativan as needed  12/2- con't Ativan prn- not taking a lot.              -antipsychotic agents: N/A 5. Neuropsych: This patient is capable of making decisions on her own behalf. 6. Skin/Wound Care: Routine skin checks 7. Fluids/Electrolytes/Nutrition: Routine in and outs with follow-up chemistries 8.  Acute blood loss anemia.  Last transfusion 11/19.    Hemoglobin 9.9 on 12/1, trending down, labs ordered for tomorrow 9.  Septic shock.  Weaned from pressors 10.  Acute respiratory failure.  Extubated 11/10.  Monitor oxygen saturations. 11.  History of inappropriate sinus tachycardia.  Followed by cardiology service Dr. Gardiner Rhyme.  Continue Lopressor 25 mg twice daily Mildly elevated on 12/3 12.  Anasarca with moderate protein malnutrtion.  Related to hypoalbuminemia and  aggressive volume repletion.  Continue Lasix.  12/1- Alb up to 2.6- will con't to monitor- low was 2.3. 13.  Obesity.  BMI 39.99.  Dietary follow-up 14.  Leukocytosis.  Resolved completed Cipro and Flagyl.  Follow-up per general surgery 15. Hypokalemia:   Since on Lasix, 20 mEq daily as of tomorrow.   Potassium 4.0 on 12/1, labs ordered for tomorrow 16.  Sinus tachycardia- per pt chronic issue Vitals:   05/10/21 2012 05/11/21 0321  BP: 131/89 123/89  Pulse: (!) 108 93  Resp: 18 18  Temp: 98.2 F (36.8 C) 98.2 F (36.8 C)  SpO2: 100% 100%     ECG from 04/2021 reviewed showing sinus tachycardia with prolonged QTC, repeat ECG ordered  Metoprolol increased to 50mg  BID on 12/5  LOS: 10 days A FACE TO [place] [employer] 05/11/2021, 10:11 AM

## 2021-05-11 NOTE — Progress Notes (Signed)
Physical Therapy Session Note  Patient Details  Name: [name] MRN: [id number] Date of Birth: April 26, 1986  Today's Date: 05/11/2021 PT Individual Time: 1416-1530 PT Individual Time Calculation (min): 74 min   Short Term Goals: Week 2:     Skilled Therapeutic Interventions/Progress Updates: Pt presents supine in bed and agreeable to therapy and requesting pain meds.  Pt received pain meds at initiation of therapy.  Pt transfers sup to sit w/ mod A w/ cueing for log roll technique to protect abd wound.  Pt still attempts to sit up before completing log roll.  Pt able to don shoes at EOB w/ Figure-4 technique and set-up.  Pt transfers sit to stand w/ CGA.  Pt step-pivots to w/c w/ RW and CGA. Pt wheeled to Dayroom for energy conservation.  Pt amb x 20' w/ RW and CGA to Nu-step.  Pt performed Nu-step at level 4 x 6' w/ UE and LE, then 1' w/ LE only and then returned to UE and LEs at Level 2 x 3' for strengthening.  Pt wheeled to main gym and pt amb x 20' to 3" steps.  Pt negotiated 8 steps w/ mod A w/ 2 rails, step-to pattern w/o knee buckling.  Pt then performed step-ups to 6" steps using B rails and alternating LEs for strengthening.  Pt performed x 6, 3 trials w/ seated rest breaks between.  Pt performed calf raises, LAQ 10-15 reps x 3 sets w/ 2# weights, and then hip flexion x 10 w/o weights, stating "pulling at sides of abdomen.  Pt returned to hallway outside of room.  Pt amb 20' w/ RW and CGA into room and bed.  Pt required min A for LEs and verbal cues for log roll for sit to supine.  Bed alarm on and all needs in reach.     Therapy Documentation Precautions:  Precautions Precautions: Fall, Other (comment) Precaution Comments: colostomy, wound vac Restrictions Weight Bearing Restrictions: No General:   Vital Signs: Therapy Vitals Temp: 98 F (36.7 C) Temp Source: Oral Pulse Rate: (!) 102 Resp: 18 BP: 123/85 Patient Position (if appropriate): Lying Oxygen Therapy SpO2: 100  % O2 Device: Room Air Pain:5/10 abdomen, pain meds received at initiation of therapy. Pain Assessment Pain Scale: 0-10 Pain Score: 3  Pain Type: Acute pain Pain Location: Abdomen Pain Descriptors / Indicators: Aching Pain Onset: On-going Mobility:      Therapy/Group: Individual Therapy  [name] 05/11/2021, 3:38 PM

## 2021-05-11 NOTE — Progress Notes (Signed)
Physical Therapy Weekly Progress Note  Patient Details  Name: Mikayla Rios MRN: [id number] Date of Birth: [birth year]  Beginning of progress report period: May 02, 2021 End of progress report period: May 11, 2021  Today's Date: 05/11/2021 PT Individual Time: 0800-0900 PT Individual Time Calculation (min): 60 min   Patient has met 4 of 5 short term goals.  Pt has not navigated 4 steps, but is progressing toward goal  Patient continues to demonstrate the following deficits muscle weakness, decreased cardiorespiratoy endurance, and decreased standing balance and decreased balance strategies and therefore will continue to benefit from skilled PT intervention to increase functional independence with mobility.  Patient progressing toward long term goals..  Continue plan of care.  PT Short Term Goals Week 1:  PT Short Term Goal 1 (Week 1): Pt will perform supine<>sit with min assist PT Short Term Goal 1 - Progress (Week 1): Met PT Short Term Goal 2 (Week 1): Pt will perform sit<>stands using LRAD with min assist PT Short Term Goal 2 - Progress (Week 1): Met PT Short Term Goal 3 (Week 1): Pt will perform stand pivot transfers using LRAD with min assist (including come to stand) PT Short Term Goal 3 - Progress (Week 1): Met PT Short Term Goal 4 (Week 1): Pt will ambulate at least 129f using LRAD with CGA PT Short Term Goal 4 - Progress (Week 1): Met PT Short Term Goal 5 (Week 1): Pt will ascend/descend 4 steps using B HRs with mod assist PT Short Term Goal 5 - Progress (Week 1): Progressing toward goal Week 2:  PT Short Term Goal 1 (Week 2): =LTGs d/t ELOS  Skilled Therapeutic Interventions/Progress Updates:    pt received in bed and agreeable to therapy. Pt reports soreness around her abdomen, premedicated. Supine>sit with HOB elevated and supervision. Pt dressed with set up assist, donning pants, socks, shoes, and pullover shirt. Sit to stand with CGA to RW. Pt ambulated to sink  with RW and supervision and performed morning hygienes tasks in standing with distant supervision. Discussed pt's goals to achieve prior to d/c including improving LE strength and endurance. Session focused on pt's stated goals. Pt ambulated to therapy gym with supervision, therapist managing wound vac, VC to reduce over reliance on RW. Pt then directed in step ups on 4" step x 10, and on 6" step 3 x 6. Pt demoed anxiety about higher step d/t knee buckling several days ago, but was able to navigate step with CGA. Pt then participated in decorating a christmas tree for improved standing and activity tolerance, 4 x ~3 min. Pt noted increased fatigue even with low-intensity activity. Discussed need to continue working on standing/activity tolerance prior to d/c, pt agrees. Pt transported back to room and performed supervision Stand pivot transfer with RW to EOB and was left with all needs in reach and alarm active.   Therapy Documentation Precautions:  Precautions Precautions: Fall, Other (comment) Precaution Comments: colostomy, wound vac Restrictions Weight Bearing Restrictions: No    Therapy/Group: Individual Therapy  OMickel Fuchs12/10/2020, 8:16 AM

## 2021-05-12 NOTE — Progress Notes (Signed)
Patient ID: Mikayla Rios, female   DOB: 12-24-1985, 35 y.o.   MRN: [id number]  Met with pt to discuss team conference supervision level goals and discharge 12/9. Looking for home health agency to provide [facility] for wound vac and PT. [facility] accepted the referral will begin the wound vac form and get faxed in. Pt is wanting to get the tub bench and aware of the cost, along with rolling walker and 3 in 1. Will continue to work on discharge needs.

## 2021-05-12 NOTE — Patient Care Conference (Signed)
Inpatient RehabilitationTeam Conference and Plan of Care Update Date: 05/12/2021   Time: 11:42 AM    Patient Name: Mikayla Rios      Medical Record Number: [id number]  Date of Birth: 1985-12-04 Sex: Female         Room/Bed: 4W13C/4W13C-01 Payor Info: Payor: [employer] / Plan: [employer] COMM PPO / Product Type: *No Product type* /    Admit Date/Time:  05/01/2021 12:07 PM  Primary Diagnosis:  [facility] Problems: Principal Problem:   Debility Active Problems:   Hypokalemia   Acute blood loss anemia   Sinus tachycardia   Prolonged Q-T interval on ECG    Expected Discharge Date: Expected Discharge Date: 05/15/21  Team Members Present: Physician leading conference: Dr. Courtney Heys Social Worker Present: Ovidio Kin, LCSW Nurse Present: Dorthula Nettles, RN PT Present: Ailene Rud, PT OT Present: Lillia Corporal, OT PPS Coordinator present : Gunnar Fusi, SLP     Current Status/Progress Goal Weekly Team Focus  Bowel/Bladder   Continent of bladder, Left side colostomy  Remain continent  toilet prn   Swallow/Nutrition/ Hydration             ADL's   CGA/Supervision for bathing at sink, LB dressing, toileting, etc. Pt uses figure four, compensatory techniques, etc. Deconditioned but improving  Supervision overall  ADL transfers, global endurance, UB strength, standing balance, family education, DC planning   Mobility   supervision overall, standing ~3 minutes, gait over 200 ft, working toward stairs  supervision goals (has kids and 12 steps to bedroom)  stair training and LE strength/endurance   Communication             Safety/Cognition/ Behavioral Observations            Pain   C/o pain to lower abdomen. PRN meds available  Pain <3-4/10  Assess Qshift and prn   Skin   Abdominal wound vac, Colostomy. Prior jp drain site; wet to dry.  Promote healing and prevention of infection.  Assess skin Qshift and prn. Wound vac Changes MWF. Daily dressing change  to prior jp drain site.     Discharge Planning:  Pt doing well and progressing-working on home health but has insurance not taken by many agencies. Being educated on colostomy care   Team Discussion: Doing colostomy care. May not be able to discharge with wound vac due to not being able to find [facility]adjusting medications. Nursing addressing pain and skin/wound care. Going to consult WOC about abdominal wound. Recommending to use Deoderant/antiperspirant under breasts. Could also order post-op bras, recommending 2. Will have 24/7 care at discharge.  Patient on target to meet rehab goals: yes, supervision goals.  *See Care Plan and progress notes for long and short-term goals.   Revisions to Treatment Plan:  Adjusting medications  Teaching Needs: Family education, medication/pain management, skin/wound care, colostomy care, safety awareness, transfer/gait training, etc.  Current Barriers to Discharge: Decreased caregiver support, Home enviroment access/layout, Wound care, Weight, Medication compliance, and stairs.  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; wound VAC and wound on abd; some MASD;  Barriers to Discharge: Decreased family/caregiver support;Home enviroment access/layout;Weight;Wound care;Other (comments);Medical stability  Barriers to Discharge Comments: colostomy- is new- getting educated; 24/7 care with husband and parents- on track for goals- Possible Resolutions to [employer] Focus: endurance biggest limitors- along with new  colostomy and wound VAC; working on stairs-  d/c 12/9   Continued Need for Acute Rehabilitation Level of Care: The patient requires daily medical management by a physician with specialized training in physical medicine and rehabilitation for the following  reasons: Direction of a multidisciplinary physical rehabilitation program to maximize functional independence : Yes Medical management of patient stability for increased activity during participation in an intensive rehabilitation regime.: Yes Analysis of laboratory values and/or radiology reports with any subsequent need for medication adjustment and/or medical intervention. : Yes   I attest that I was present, lead the team conference, and concur with the assessment and plan of the team.   Cristi Loron 05/12/2021, 4:21 PM

## 2021-05-12 NOTE — Progress Notes (Signed)
Occupational Therapy Session Note  Patient Details  Name: Mikayla Rios MRN: [id number] Date of Birth: Dec 28, 1985  Today's Date: 05/12/2021 OT Individual Time: 2876-8115 OT Individual Time Calculation (min): 54 min    Short Term Goals: Week 1:  OT Short Term Goal 1 (Week 1): Pt will complete toileting with Min A for sit<stands OT Short Term Goal 1 - Progress (Week 1): Met OT Short Term Goal 2 (Week 1): Pt will complete LB dressing with Min A using AE as needed OT Short Term Goal 2 - Progress (Week 1): Met OT Short Term Goal 3 (Week 1): Pt will complete oral care while standing at the sink without seated rest to increase activity tolerance OT Short Term Goal 3 - Progress (Week 1): Progressing toward goal Week 2:  OT Short Term Goal 1 (Week 2): STGs = LTGs 2/2 ELOS at Supervision   Skilled Therapeutic Interventions/Progress Updates:    Pt greeted at time of session semireclined in bed resting agreeable to OT session, pt with pain at beginning of session at wound vac area and overall muscle soreness. Nursing aware and provided med pass with pain meds at beginning of session. Supine > sit with Supervision and ambulated to bathroom CGA, toilet transfer same manner and performed pericare in standing before walking out to sink for hand hygiene, all with RW. Wheelchair transport > day room and walked short distance approx 15 feet to therapy mat. Pt ambulating throughout session with wound vac on RW in prep for if pt does go home with wound vac and how to manage. Pt able to remove wound vac from RW later in session for practice as well. On EOM, pt performed 1x3 sit <> stands with no AD and no UE support for BLE strengthening from mat at 24", 22" and 20" all with CGA and up to min/guard from lowest surface. Standing toe taps for 2 rounds up to 3 taps in sequence encouraging hip flexion and 'stepping" motion to improve precursor skills for steps to access upstairs for ADL tasks. Standing knee raises for  1x20 with focus on hip and knee flexion again to improve those precursor skills. Ambulated back to room with wound vac on RW for practice in case pt does go home with vac. Sit > supine Supervision. Alarm on call bell in reach.     Therapy Documentation Precautions:  Precautions Precautions: Fall, Other (comment) Precaution Comments: colostomy, wound vac Restrictions Weight Bearing Restrictions: No     Therapy/Group: Individual Therapy  Viona Gilmore 05/12/2021, 7:23 AM

## 2021-05-12 NOTE — Progress Notes (Signed)
PROGRESS NOTE   Subjective/Complaints:  Pt reports pain is doing OK- taking meds.  Still sore- Still itching- has had associated white "bumps" that appear and disappear- doesn't have now- asked her to take pics so know what we are dealing with.    ROS:  Pt denies SOB, abd pain, CP, N/V/C/D, and vision changes   Objective:   No results found. Recent Labs    05/11/21 0529  WBC 8.2  HGB 10.4*  HCT 32.3*  PLT 557*   Recent Labs    05/11/21 0529  NA 137  K 3.5  CL 97*  CO2 30  GLUCOSE 93  BUN 7  CREATININE 0.62  CALCIUM 8.9    Intake/Output Summary (Last 24 hours) at 05/12/2021 6979 Last data filed at 05/11/2021 2000 Gross per 24 hour  Intake 300 ml  Output 2 ml  Net 298 ml        Physical Exam: Vital Signs Blood pressure 116/73, pulse 98, temperature 98 F (36.7 C), resp. rate 18, height 5\' 3"  (1.6 m), weight 93.2 kg, SpO2 100 %.    General: awake, alert, appropriate, sitting up in bed; on phone with mother; NAD HENT: conjugate gaze; oropharynx moist CV: borderline tachycardic  rate; no JVD Pulmonary: CTA B/L; no W/R/R- good air movement GI: soft, NT, ND, (+)BS- dressing in place on L side; colostomy working- loose stool in bag;  VAC appears smaller area on  R side Psychiatric: appropriate Neuro: Alert- Ox3 Motor: Bilateral lower extremities: Grossly 4 -/5 throughout, unchanged  Assessment/Plan: 1. Functional deficits which require 3+ hours per day of interdisciplinary therapy in a comprehensive inpatient rehab setting. Physiatrist is providing close team supervision and 24 hour management of active medical problems listed below. Physiatrist and rehab team continue to assess barriers to discharge/monitor patient progress toward functional and medical goals  Care Tool:  Bathing    Body parts bathed by patient: Right arm, Left arm, Chest, Abdomen, Front perineal area, Buttocks, Right upper leg,  Left upper leg, Face, Right lower leg, Left lower leg   Body parts bathed by helper: Right lower leg, Left lower leg     Bathing assist Assist Level: Contact Guard/Touching assist     Upper Body Dressing/Undressing Upper body dressing   What is the patient wearing?: Dress    Upper body assist Assist Level: Contact Guard/Touching assist    Lower Body Dressing/Undressing Lower body dressing      What is the patient wearing?: Underwear/pull up     Lower body assist Assist for lower body dressing: Contact Guard/Touching assist     Toileting Toileting    Toileting assist Assist for toileting: Contact Guard/Touching assist     Transfers Chair/bed transfer  Transfers assist     Chair/bed transfer assist level: Contact Guard/Touching assist Chair/bed transfer assistive device: Programmer, multimedia   Ambulation assist   Ambulation activity did not occur: Safety/medical concerns (requires use of RW, which pt did not use at baseline and pt reports feeling unsafe to attempt gait without RW today)  Assist level: Contact Guard/Touching assist Assistive device: Walker-rolling Max distance: 20   Walk 10 feet activity   Assist  Walk 10 feet activity did not occur: Safety/medical concerns  Assist level: Contact Guard/Touching assist Assistive device: Walker-rolling   Walk 50 feet activity   Assist Walk 50 feet with 2 turns activity did not occur: Safety/medical concerns  Assist level: Contact Guard/Touching assist Assistive device: Walker-rolling    Walk 150 feet activity   Assist Walk 150 feet activity did not occur: Safety/medical concerns  Assist level: Contact Guard/Touching assist Assistive device: Walker-rolling    Walk 10 feet on uneven surface  activity   Assist Walk 10 feet on uneven surfaces activity did not occur: Safety/medical concerns         Wheelchair     Assist Is the patient using a wheelchair?: Yes Type of  Wheelchair: Manual    Wheelchair assist level: Dependent - Patient 0%      Wheelchair 50 feet with 2 turns activity    Assist        Assist Level: Dependent - Patient 0%   Wheelchair 150 feet activity     Assist      Assist Level: Dependent - Patient 0%   Blood pressure 116/73, pulse 98, temperature 98 F (36.7 C), resp. rate 18, height 5\' 3"  (1.6 m), weight 93.2 kg, SpO2 100 %.    Medical Problem List and Plan: 1.  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  Status post laparoscopic-assisted Hartmann left colectomy/end colostomy, small bowel resection, left retroperitoneal/flank abscess drainage 04/15/2021.  JP drain removed 04/28/2021.  Colostomy care as directed/wound [facility] Monday Wednesday Friday.  Plan outpatient follow-up medical oncology for systemic chemotherapy  Continue CIR PT, OT, con't CIR_ PT and OT 2. Impaired mobility: vascular ultrasound reviewed and negative for clot -DVT/anticoagulation:  Mechanical: Antiembolism stockings, thigh (TED hose) Bilateral lower extremities.   Started Lovenox since Hb stable.  12/6- Hb 10.4- stable             -antiplatelet therapy: N/A 3. Pain: continue Voltaren gel 4 times daily, oxycodone/Robaxin as needed  12/6- pain controlled- con't regimen 4. Mood: Ativan as needed  12/2- con't Ativan prn- not taking a lot.              -antipsychotic agents: N/A 5. Neuropsych: This patient is capable of making decisions on her own behalf. 6. Skin/Wound Care: Routine skin checks 7. Fluids/Electrolytes/Nutrition: Routine in and outs with follow-up chemistries 8.  Acute blood loss anemia.  Last transfusion 11/19.    Hemoglobin 9.9 on 12/1, trending down, labs ordered for tomorrow  12/6- Hb up to 10.4- con't to monitor 9.  Septic shock.  Weaned from pressors 10.  Acute respiratory failure.  Extubated 11/10.  Monitor oxygen saturations. 11.  History of inappropriate sinus tachycardia.  Followed by cardiology  service Dr. Gardiner Rhyme.  Continue Lopressor 25 mg twice daily Mildly elevated on 12/3 12.  Anasarca with moderate protein malnutrtion.  Related to hypoalbuminemia and aggressive volume repletion.  Continue Lasix.  12/1- Alb up to 2.6- will con't to monitor- low was 2.3.  12/6- Alb up to 2.6-  13.  Obesity.  BMI 39.99.  Dietary follow-up 14.  Leukocytosis.  Resolved completed Cipro and Flagyl.  Follow-up per general surgery 15. Hypokalemia:   Since on Lasix, 20 mEq daily as of tomorrow.   Potassium 4.0 on 12/1, labs ordered for tomorrow 16.  Sinus tachycardia- per pt chronic issue Vitals:   05/11/21 2021 05/12/21 0613  BP: 108/82 116/73  Pulse: (!) 110 98  Resp: 18 18  Temp: 98.1 F (36.7 C)  98 F (36.7 C)  SpO2: 100% 100%     ECG from 04/2021 reviewed showing sinus tachycardia with prolonged QTC, repeat ECG ordered  Metoprolol increased to 50mg  BID on 12/5  12/6- HR 98-110- just got increase of metoprolol so will let this dose continue for now.   LOS: 11 days A FACE TO FACE EVALUATION WAS PERFORMED  Yasseen Salls 05/12/2021, 8:21 AM

## 2021-05-12 NOTE — Progress Notes (Signed)
RN perform colostomy pouch change. Patient educated on each step during pouch change. Patient verbalized understanding.

## 2021-05-12 NOTE — Progress Notes (Signed)
Physical Therapy Session Note  Patient Details  Name: Mikayla Rios MRN: [id number] Date of Birth: 15-Jun-1985  Today's Date: 05/12/2021 PT Individual Time: 0800-0915, 1422-1530 PT Individual Time Calculation (min): 75 min, 68 min  Short Term Goals: Week 1:  PT Short Term Goal 1 (Week 1): Pt will perform supine<>sit with min assist PT Short Term Goal 1 - Progress (Week 1): Met PT Short Term Goal 2 (Week 1): Pt will perform sit<>stands using LRAD with min assist PT Short Term Goal 2 - Progress (Week 1): Met PT Short Term Goal 3 (Week 1): Pt will perform stand pivot transfers using LRAD with min assist (including come to stand) PT Short Term Goal 3 - Progress (Week 1): Met PT Short Term Goal 4 (Week 1): Pt will ambulate at least 142f using LRAD with CGA PT Short Term Goal 4 - Progress (Week 1): Met PT Short Term Goal 5 (Week 1): Pt will ascend/descend 4 steps using B HRs with mod assist PT Short Term Goal 5 - Progress (Week 1): Progressing toward goal Week 2:  PT Short Term Goal 1 (Week 2): =LTGs d/t ELOS  Skilled Therapeutic Interventions/Progress Updates:    Session 1: pt received in bed and agreeable to therapy. Pt reports 5/10 abdominal pain, premedicated. Rest and positioning provided as needed. Supine>sit with HOB elevated and bed rail with supervision. Discussed bed rail options for home. Pt emptied ostomy bag and donned dress, socks and shoes with set up assist. Pt stood from low bed with CGA to RW and ambulated to sink with supervision. Standing hygiene at sink x 5 min with distant supervision for improved standing tolerance and independence.   Pt requested to use bathroom, supervision gait into bathroom for wound vac management. Continent urine void, set up for for 3/3 toileting tasks. Hand hygiene in standing with distant supervision,   Gait to/from therapy gym with RW and supervision, therapist managing wound vac, for improved endurance and functional mobility. Pt demoes incr  reliance on UE which improves with VC. Narrow BOS that improved with cueing.  Pt then performed step ups x 6 to prepare for stair navigation. Pt navigated 6" steps 2 x 8 with BUE support and seated rest break with CGA throughout to mimic home set up. Pt expressed feeling accomplished and excited about being able to manage stairs.   Pt ambulated x 150 ft with CGA and no AD for improvements in confidence and balance with gait before returning to room with RW and supervision.   Pt remained in w/c at end of session and was left with all needs in reach and alarm active.    Session 2: pt received in bed and agreeable to therapy. Pt reports 6/10 pain at start of session, nursing made aware and administered pain medication during session.  Supine<>sit with supervision and bed rail. Pt donned shoes with set up assist. Sit to stand to RW with supervision from low bed.   Gait to/from dayroom  with distant supervision and RW, wound vac on front of RW. Pt directed in squats with no UE support and modified sit ups to physioball, with minor discomfort so exercise was discontinued.   Pt directed in obstacle course as follows x 2: 2 sticks to mimic threshold  2 hurdles to improve dynamic balance Airex to improve proprioception on compliant surface  Stepping around targets to mimic busy home environment (toys on the floor)  Nustep  HIIT 2 min at level 6 and 1 min at level 3  for 14 min  Pt reported RPE 16/20 at level 6 and 9/20 at level 3   Pt ambulated back to room in the same manner and returned to bed with supervision. Pt was left with all needs in reach and alarm active.    Therapy Documentation Precautions:  Precautions Precautions: Fall, Other (comment) Precaution Comments: colostomy, wound vac Restrictions Weight Bearing Restrictions: No General:      Therapy/Group: Individual Therapy  [name] 05/12/2021, 8:19 AM

## 2021-05-13 DIAGNOSIS — C187 Malignant neoplasm of sigmoid colon: Secondary | ICD-10-CM | POA: Diagnosis not present

## 2021-05-13 DIAGNOSIS — R5381 Other malaise: Secondary | ICD-10-CM | POA: Diagnosis not present

## 2021-05-13 DIAGNOSIS — K5720 Diverticulitis of large intestine with perforation and abscess without bleeding: Secondary | ICD-10-CM | POA: Diagnosis not present

## 2021-05-13 DIAGNOSIS — R6510 Systemic inflammatory response syndrome (SIRS) of non-infectious origin without acute organ dysfunction: Secondary | ICD-10-CM | POA: Diagnosis not present

## 2021-05-13 MED ORDER — FUROSEMIDE 20 MG PO TABS
20.0000 mg | ORAL_TABLET | Freq: Every day | ORAL | Status: DC
  Administered 2021-05-14 – 2021-05-15 (×2): 20 mg via ORAL
  Filled 2021-05-13 (×2): qty 1

## 2021-05-13 MED ORDER — OXYCODONE HCL 5 MG PO TABS
10.0000 mg | ORAL_TABLET | ORAL | Status: DC | PRN
  Administered 2021-05-13 – 2021-05-15 (×8): 10 mg via ORAL
  Filled 2021-05-13 (×7): qty 2

## 2021-05-13 MED ORDER — METHOCARBAMOL 500 MG PO TABS
1000.0000 mg | ORAL_TABLET | Freq: Four times a day (QID) | ORAL | Status: DC
  Administered 2021-05-13 – 2021-05-15 (×9): 1000 mg via ORAL
  Filled 2021-05-13 (×9): qty 2

## 2021-05-13 MED ORDER — PROSOURCE PLUS PO LIQD
30.0000 mL | Freq: Every day | ORAL | Status: DC
  Administered 2021-05-13 – 2021-05-15 (×3): 30 mL via ORAL
  Filled 2021-05-13 (×3): qty 30

## 2021-05-13 NOTE — Progress Notes (Addendum)
Patient ID: Mikayla Rios, female   DOB: [birth year], 35 y.o.   MRN: [id number] Form faxed to [employer] to being auth for home wound vac. MD reports surgery to come today to look at wound and give recommendations.  11:;23 am [facility] needs measurements of wound.

## 2021-05-13 NOTE — Progress Notes (Signed)
Nutrition Follow-up  DOCUMENTATION CODES:   Obesity unspecified  INTERVENTION:  Continue Ensure Enlive po BID, each supplement provides 350 kcal and 20 grams of protein  Provide 30 ml Prosource plus po once daily, each supplement provides 100 kcal and 15 grams of protein.    Encourage adequate PO intake.   NUTRITION DIAGNOSIS:   Increased nutrient needs related to chronic illness (cancer) as evidenced by estimated needs; ongoing  GOAL:   Patient will meet greater than or equal to 90% of their needs; progressing  MONITOR:   PO intake, Supplement acceptance, Labs, Weight trends, Skin, I & O's  REASON FOR ASSESSMENT:   Malnutrition Screening Tool    ASSESSMENT:   35 year old female with past medical history of hypertension, inappropriate sinus tachycardia, obesity, obstructive sleep apnea and diverticulosis with complicated diverticulitis and abscess formation 11/2020 status post IR guided placement of 2 left lower quadrant drains. Pt s/p laparoscopic assisted hartman (L colectomy/end colostomy), small bowel resection, L retroperitoneal/flank abscess drainage 11/9. Pt with colonic adenocarcinoma. Pt admitted to CIR.  Meal completion has been varied from 40-100% with 50% at lunch today. Pt currently has ensure ordered and has been consuming them. Wound VAC has been discontinued today. RD to additionally order Prosource plus to aid in increased protein needs as well as in wound healing.   Labs and medications reviewed.   Diet Order:   Diet Order             Diet Carb Modified Fluid consistency: Thin; Room service appropriate? Yes  Diet effective now                   EDUCATION NEEDS:   Not appropriate for education at this time  Skin:  Skin Assessment: Skin Integrity Issues: Skin Integrity Issues:: Incisions, Wound VAC Wound Vac: discontinued 12/7 Incisions: abdomen  Last BM:  12/7 colostomy  Height:   Ht Readings from Last 1 Encounters:  05/04/21 5\' 3"  (1.6  m)    Weight:   Wt Readings from Last 1 Encounters:  05/08/21 93.2 kg   BMI:  Body mass index is 36.4 kg/m.  Estimated Nutritional Needs:   Kcal:  2100-2300  Protein:  110-120 grams  Fluid:  >/= 2 L/day  Corrin Parker, MS, RD, LDN RD pager number/after hours weekend pager number on Am[employer].

## 2021-05-13 NOTE — Progress Notes (Signed)
Occupational Therapy Session Note  Patient Details  Name: Mikayla Rios MRN: [id number] Date of Birth: 1985/06/25  Today's Date: 05/13/2021 OT Individual Time: 4000-5056 OT Individual Time Calculation (min): 72 min    Short Term Goals: Week 1:  OT Short Term Goal 1 (Week 1): Pt will complete toileting with Min A for sit<stands OT Short Term Goal 1 - Progress (Week 1): Met OT Short Term Goal 2 (Week 1): Pt will complete LB dressing with Min A using AE as needed OT Short Term Goal 2 - Progress (Week 1): Met OT Short Term Goal 3 (Week 1): Pt will complete oral care while standing at the sink without seated rest to increase activity tolerance OT Short Term Goal 3 - Progress (Week 1): Progressing toward goal Week 2:  OT Short Term Goal 1 (Week 2): STGs = LTGs 2/2 ELOS at Supervision   Skilled Therapeutic Interventions/Progress Updates:    Pt greeted at time of session semireclined in bed resting, c/o L flank pain around drain site and pt requesting pain meds, relayed to RN and med pass for pain medication performed at beginning of session. Supine > sit Min A 2/2 pain, sitting EOB for a few minutes prior to mobility. Walked short distance to wheelchair Supervision, set up at sink and performed UB/LB bathing with Set up/Supervision. Provided pt with LHS for home use for energy conservation and improved ability to reach feet. UB dress set up, LB dress with Set up/Supervision. Pt able to don new gripper socks with set up. Pt performing sit <> stands with Mod I, supervision for ADL transfers. Pt wanting to go outside, transported outside for uplifting mood and for leisure activity. Transported back to room and alarm on call bell in reach.  Therapy Documentation Precautions:  Precautions Precautions: Fall, Other (comment) Precaution Comments: colostomy, wound vac Restrictions Weight Bearing Restrictions: No     Therapy/Group: Individual Therapy  Viona Gilmore 05/13/2021, 7:23 AM

## 2021-05-13 NOTE — Progress Notes (Signed)
PROGRESS NOTE   Subjective/Complaints:  No white dots seen on her skin associated with itching- Muscle relaxants helps a lot when even Oxy doesn't.   On 1000 mg Robaxin q6 hours prn- cannot change except to make scheduled.  ROS:   Pt denies SOB, abd pain, CP, N/V/C/D, and vision changes   Objective:   No results found. Recent Labs    05/11/21 0529  WBC 8.2  HGB 10.4*  HCT 32.3*  PLT 557*   Recent Labs    05/11/21 0529  NA 137  K 3.5  CL 97*  CO2 30  GLUCOSE 93  BUN 7  CREATININE 0.62  CALCIUM 8.9    Intake/Output Summary (Last 24 hours) at 05/13/2021 0830 Last data filed at 05/12/2021 1310 Gross per 24 hour  Intake 120 ml  Output --  Net 120 ml        Physical Exam: Vital Signs Blood pressure 113/82, pulse 99, temperature 98.1 F (36.7 C), resp. rate 18, height 5\' 3"  (1.6 m), weight 93.2 kg, SpO2 100 %.     General: awake, alert, appropriate, sitting up in bed, on phone with mother; NAD HENT: conjugate gaze; oropharynx moist CV: borderline tachycardic rate; no JVD Pulmonary: CTA B/L; no W/R/R- good air movement GI: soft, NT, ND, (+)BS- (+) colostomy- loose stool in bag; VAC looks the same- ~3x3 cm? Psychiatric: appropriate; sad affect, but interactive Neurological: Ox3  Motor: Bilateral lower extremities: Grossly 4 -/5 throughout, unchanged  Assessment/Plan: 1. Functional deficits which require 3+ hours per day of interdisciplinary therapy in a comprehensive inpatient rehab setting. Physiatrist is providing close team supervision and 24 hour management of active medical problems listed below. Physiatrist and rehab team continue to assess barriers to discharge/monitor patient progress toward functional and medical goals  Care Tool:  Bathing    Body parts bathed by patient: Right arm, Left arm, Chest, Abdomen, Front perineal area, Buttocks, Right upper leg, Left upper leg, Face, Right lower  leg, Left lower leg   Body parts bathed by helper: Right lower leg, Left lower leg     Bathing assist Assist Level: Contact Guard/Touching assist     Upper Body Dressing/Undressing Upper body dressing   What is the patient wearing?: Dress    Upper body assist Assist Level: Contact Guard/Touching assist    Lower Body Dressing/Undressing Lower body dressing      What is the patient wearing?: Underwear/pull up     Lower body assist Assist for lower body dressing: Contact Guard/Touching assist     Toileting Toileting    Toileting assist Assist for toileting: Contact Guard/Touching assist     Transfers Chair/bed transfer  Transfers assist     Chair/bed transfer assist level: Contact Guard/Touching assist Chair/bed transfer assistive device: Programmer, multimedia   Ambulation assist   Ambulation activity did not occur: Safety/medical concerns (requires use of RW, which pt did not use at baseline and pt reports feeling unsafe to attempt gait without RW today)  Assist level: Contact Guard/Touching assist Assistive device: Walker-rolling Max distance: 20   Walk 10 feet activity   Assist  Walk 10 feet activity did not occur: Safety/medical concerns  Assist level: Contact Guard/Touching assist Assistive device: Walker-rolling   Walk 50 feet activity   Assist Walk 50 feet with 2 turns activity did not occur: Safety/medical concerns  Assist level: Contact Guard/Touching assist Assistive device: Walker-rolling    Walk 150 feet activity   Assist Walk 150 feet activity did not occur: Safety/medical concerns  Assist level: Contact Guard/Touching assist Assistive device: Walker-rolling    Walk 10 feet on uneven surface  activity   Assist Walk 10 feet on uneven surfaces activity did not occur: Safety/medical concerns         Wheelchair     Assist Is the patient using a wheelchair?: Yes Type of Wheelchair: Manual    Wheelchair  assist level: Dependent - Patient 0%      Wheelchair 50 feet with 2 turns activity    Assist        Assist Level: Dependent - Patient 0%   Wheelchair 150 feet activity     Assist      Assist Level: Dependent - Patient 0%   Blood pressure 113/82, pulse 99, temperature 98.1 F (36.7 C), resp. rate 18, height 5\' 3"  (1.6 m), weight 93.2 kg, SpO2 100 %.    Medical Problem List and Plan: 1.  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  Status post laparoscopic-assisted Hartmann left colectomy/end colostomy, small bowel resection, left retroperitoneal/flank abscess drainage 04/15/2021.  JP drain removed 04/28/2021.  Colostomy care as directed/wound [facility] Monday Wednesday Friday.  Plan outpatient follow-up medical oncology for systemic chemotherapy  D/c 05/15/21 Con't CIR- PT and OT- main limitation endurance per therapy.  2. Impaired mobility: vascular ultrasound reviewed and negative for clot -DVT/anticoagulation:  Mechanical: Antiembolism stockings, thigh (TED hose) Bilateral lower extremities.   Started Lovenox since Hb stable.  12/6- Hb 10.4- stable 12/7- pt walking 150 ft- will not send home on Lovenox, but continue until d/c.              -antiplatelet therapy: N/A 3. Pain: continue Voltaren gel 4 times daily, oxycodone/Robaxin as needed  12/7- change Robaxin to 1000 mg q6 hours scheduled- since so helpful.  4. Mood: Ativan as needed  12/2- con't Ativan prn- not taking a lot.              -antipsychotic agents: N/A 5. Neuropsych: This patient is capable of making decisions on her own behalf. 6. Skin/Wound Care: Routine skin checks 7. Fluids/Electrolytes/Nutrition: Routine in and outs with follow-up chemistries 8.  Acute blood loss anemia.  Last transfusion 11/19.    Hemoglobin 9.9 on 12/1, trending down, labs ordered for tomorrow  12/6- Hb up to 10.4- con't to monitor 9.  Septic shock.  Weaned from pressors 10.  Acute respiratory failure.   Extubated 11/10.  Monitor oxygen saturations. 11.  History of inappropriate sinus tachycardia.  Followed by cardiology service Dr. Gardiner Rhyme.  Continue Lopressor 25 mg twice daily Mildly elevated on 12/3 12.  Anasarca with moderate protein malnutrtion.  Related to hypoalbuminemia and aggressive volume repletion.  Continue Lasix.  12/1- Alb up to 2.6- will con't to monitor- low was 2.3.  12/6- Alb up to 2.6-   12/7- will decrease Lasix to 20 mg daily- since swelling is better- will recheck labs in AM for borderline low K+ of 3.5  13.  Obesity.  BMI 39.99.  Dietary follow-up 14.  Leukocytosis.  Resolved completed Cipro and Flagyl.  Follow-up per general surgery 15. Hypokalemia:   Since on Lasix, 20 mEq daily as of tomorrow.  Potassium 4.0 on 12/1, labs ordered for tomorrow  12/7- labs in AM 16.  Sinus tachycardia- per pt chronic issue Vitals:   05/12/21 1943 05/13/21 0548  BP: 122/81 113/82  Pulse: (!) 107 99  Resp: 16 18  Temp: 98.3 F (36.8 C) 98.1 F (36.7 C)  SpO2: 100% 100%     ECG from 04/2021 reviewed showing sinus tachycardia with prolonged QTC, repeat ECG ordered  Metoprolol increased to 50mg  BID on 12/5  12/6- HR 98-110- just got increase of metoprolol so will let this dose continue for now.   12/7- HR 99-107 in last 24 hours- will hold on increasing Metoprolol since BP 568 systolic this AM.   LOS: 12 days A FACE TO FACE EVALUATION WAS PERFORMED  Sianna Garofano 05/13/2021, 8:30 AM

## 2021-05-13 NOTE — Progress Notes (Signed)
   Progress Note     Subjective: Pt seen in CIR for VAC change today. Planning discharge from inpatient rehab 12/9.   Objective: Vital signs in last 24 hours: Temp:  [98.1 F (36.7 C)-98.5 F (36.9 C)] 98.5 F (36.9 C) (12/07 1300) Pulse Rate:  [99-107] 103 (12/07 1300) Resp:  [16-18] 18 (12/07 1300) BP: (113-139)/(81-88) 139/88 (12/07 1300) SpO2:  [100 %] 100 % (12/07 1300) Last BM Date: 05/12/21  Intake/Output from previous day: 12/06 0701 - 12/07 0700 In: 120 [P.O.:120] Out: -  Intake/Output this shift: Total I/O In: 200 [P.O.:200] Out: -   PE: GI: soft, ND, NT, midline wound clean and measures 6 cm x 4 cm x <0.5 cm depth, stoma viable with stool in ostomy bag     Lab Results:  Recent Labs    05/11/21 0529  WBC 8.2  HGB 10.4*  HCT 32.3*  PLT 557*   BMET Recent Labs    05/11/21 0529  NA 137  K 3.5  CL 97*  CO2 30  GLUCOSE 93  BUN 7  CREATININE 0.62  CALCIUM 8.9   PT/INR No results for input(s): LABPROT, INR in the last 72 hours. CMP     Component Value Date/Time   NA 137 05/11/2021 0529   NA 139 12/21/2019 1024   K 3.5 05/11/2021 0529   CL 97 (L) 05/11/2021 0529   CO2 30 05/11/2021 0529   GLUCOSE 93 05/11/2021 0529   BUN 7 05/11/2021 0529   BUN 13 12/21/2019 1024   CREATININE 0.62 05/11/2021 0529   CALCIUM 8.9 05/11/2021 0529   PROT 6.5 05/11/2021 0529   PROT 7.4 07/11/2019 1638   ALBUMIN 2.6 (L) 05/11/2021 0529   ALBUMIN 4.6 07/11/2019 1638   AST 14 (L) 05/11/2021 0529   ALT 9 05/11/2021 0529   ALKPHOS 35 (L) 05/11/2021 0529   BILITOT 0.5 05/11/2021 0529   BILITOT 0.2 07/11/2019 1638   GFRNONAA >60 05/11/2021 0529   GFRAA 96 12/21/2019 1024   Lipase     Component Value Date/Time   LIPASE 21 11/04/2020 1119       Studies/Results: No results found.  Anti-infectives: Anti-infectives (From admission, onward)    Start     Dose/Rate Route Frequency Ordered Stop   05/01/21 2000  ciprofloxacin (CIPRO) tablet 500 mg         500 mg Oral 2 times daily 05/01/21 1214 05/06/21 0745   05/01/21 2000  metroNIDAZOLE (FLAGYL) tablet 500 mg        500 mg Oral Every 12 hours 05/01/21 1214 05/06/21 0744        Assessment/Plan S/P LAPAROSCOPIC ASSISTED HARTMANN  (LEFT COLECTOMY / END COLOSTOMY), SMALL BOWEL RESECTION, LEFT RETROPERITONEAL / FLANK ABSCESS DRAINAGE 04/15/21 Dr. [name] - wound VAC removed today, transition to daily wet to dry dressings - ok to shower with wound open, will need dressing changed after - stable for discharge from a surgical perspective  - Follow up with Dr. Johney Maine in 2-3 weeks  ID - no current abx, no leukocytosis FEN - carb mod diet VTE - SCDs/LMWH   Follow up with Dr. Johney Maine.    HTN Morbid obesity BMI 39.99 Anemia - hgb stable 12/5 at 10.4  LOS: 12 days    Norm Parcel, [facility] Surgery 05/13/2021, 2:23 PM Please see Amion for pager number during day hours 7:00am-4:30pm

## 2021-05-13 NOTE — Progress Notes (Signed)
Physical Therapy Session Note  Patient Details  Name: Mikayla Rios MRN: [id number] Date of Birth: September 24, 1985  Today's Date: 05/13/2021 PT Individual Time: 1100-1200, 1345-1505 PT Individual Time Calculation (min): 60 min, 80 min   Short Term Goals: Week 1:  PT Short Term Goal 1 (Week 1): Pt will perform supine<>sit with min assist PT Short Term Goal 1 - Progress (Week 1): Met PT Short Term Goal 2 (Week 1): Pt will perform sit<>stands using LRAD with min assist PT Short Term Goal 2 - Progress (Week 1): Met PT Short Term Goal 3 (Week 1): Pt will perform stand pivot transfers using LRAD with min assist (including come to stand) PT Short Term Goal 3 - Progress (Week 1): Met PT Short Term Goal 4 (Week 1): Pt will ambulate at least 128f using LRAD with CGA PT Short Term Goal 4 - Progress (Week 1): Met PT Short Term Goal 5 (Week 1): Pt will ascend/descend 4 steps using B HRs with mod assist PT Short Term Goal 5 - Progress (Week 1): Progressing toward goal  Skilled Therapeutic Interventions/Progress Updates:    Session 1: Pt seated in w/c on arrival and agreeable to therapy. Pt c/o some soreness in her abdomen, premedicated. Rest and positioning provided as needed. Pt ambulated to therapy gym with supervision with RW with wound vac attached. Pt performed step ups on 6" step x 10 with BUE support. Session focused on stair navigation in prep for d/c. Stair training as follows: X4 with BUE support X4 with BUE on R handrail  X4 with R hand rail and L HHA 2 x 8 with L HHA and R hand rail  Gait with out RW, 3 x 150 ft. CGA fading to close supervision. Pt then ambulated back to room with no RW and supervision, demoing improvements in posture compared to using walker.   On return to room, pt performed 5XSTS as documented below. Pt returned to bed with supervision and was left with all needs in reach and alarm active.   Five times Sit to Stand Test (FTSS) Method: Use a straight back chair with a  solid seat that is 16-18" high. Ask participant to sit on the chair with arms folded across their chest.   Instructions: "Stand up and sit down as quickly as possible 5 times, keeping your arms folded across your chest."   Measurement: Stop timing when the participant stands the 5th time.  TIME: ___21.6___ (in seconds)  Times > 13.6 seconds is associated with increased disability and morbidity (Guralnik, 2000) Times > 15 seconds is predictive of recurrent falls in healthy individuals aged 627and older (Buatois, et al., 2008) Normal performance values in community dwelling individuals aged 680and older (Bohannon, 2006): 60-69 years: 11.4 seconds 70-79 years: 12.6 seconds 80-89 years: 14.8 seconds  MCID: ? 2.3 seconds for Vestibular Disorders (Mariah Milling 2006)    Session 2: pt received in bed and agreeable to therapy. Pt premedicated, some cramping with activity, addressed with rest as needed. Pt stood from low bed and ambulated into bathroom with no AD and supervision, therapist managing wound vac. Distant supervision for for 3/3 toileting tasks. Pt then ambulated to sink and performed hand hygiene in same manner.  [facility] of outdoor environment on mood and outlook. Pt performed 5 bouts of ambulation, 150-200 ft each, including ascending/descending 200 foot inclin and walking on brick. Supervision throughout, with therapist managing wound vac, no AD. Occ minor LOB, recovered without assist.  Therapeutic rest breaks discussing plans for d/c  Returned to unit and utilized nustep for endurance and global strength: 3 x 4 min at level 5 with 2 minute rest, pt reported RPE 13-14/20 during first bout, 15/20 during 3rd bout.   Pt ambulated back to room and returned to bed with supervision. Pt was left with nsg and PA present for wound vac change.   Therapy Documentation Precautions:  Precautions Precautions: Fall, Other (comment) Precaution Comments:  colostomy, wound vac Restrictions Weight Bearing Restrictions: No General:      Therapy/Group: Individual Therapy  [name] 05/13/2021, 12:14 PM

## 2021-05-13 NOTE — Progress Notes (Signed)
Occupational Therapy Discharge Summary  Patient Details  Name: Mikayla Rios MRN: [id number] Date of Birth: September 24, 1985   Patient has met 8 of 8 long term goals due to improved activity tolerance, improved balance, postural control, ability to compensate for deficits, improved awareness, and improved coordination.  Patient to discharge at overall Supervision level.  Patient's care partner is independent to provide the necessary physical assistance at discharge.  Pt is Set up - Supervision for sink level bathing, toileting, ambulatory transfers with RW, and dressing tasks at sit > stand level for LB. Pt uses several compensatory techniques including figure four and using her environment. Pt's husband has been present for occasional OT session to observe/have training.   Reasons goals not met: NA  Recommendation:  Patient will benefit from ongoing skilled OT services in home health setting to continue to advance functional skills in the area of BADL, iADL, and Reduce care partner burden.  Equipment: BSC, TTB  Reasons for discharge: treatment goals met and discharge from hospital  Patient/family agrees with progress made and goals achieved: Yes  OT Discharge Precautions/Restrictions  Precautions Precautions: Fall Precaution Comments: colostomy, wound vac Restrictions Weight Bearing Restrictions: No Vital Signs Therapy Vitals Temp: 98.5 F (36.9 C) Temp Source: Oral Pulse Rate: (!) 103 Resp: 18 BP: 139/88 Patient Position (if appropriate): Sitting Oxygen Therapy SpO2: 100 % O2 Device: Room Air Pain Pain Assessment Pain Scale: 0-10 Pain Score: 5  ADL ADL Eating: Set up Grooming: Setup Where Assessed-Grooming: Standing at sink Upper Body Bathing: Setup Where Assessed-Upper Body Bathing: Edge of bed Lower Body Bathing: Supervision/safety Where Assessed-Lower Body Bathing: Edge of bed Upper Body Dressing: Setup Where Assessed-Upper Body Dressing: Edge of bed Lower Body  Dressing: Supervision/safety Where Assessed-Lower Body Dressing: Edge of bed Toileting: Supervision/safety Where Assessed-Toileting: Glass blower/designer: Close supervision Toilet Transfer Method: Counselling psychologist: Raised Adult nurse: Not assessed Vision Baseline Vision/History: 0 No visual deficits Patient Visual Report: No change from baseline Perception  Perception: Within Functional Limits Praxis Praxis: Intact Cognition Overall Cognitive Status: Within Functional Limits for tasks assessed Arousal/Alertness: Awake/alert Orientation Level: Oriented X4 Year: 2022 Month: December Day of Week: Correct Focused Attention: Appears intact Sustained Attention: Appears intact Selective Attention: Appears intact Memory: Appears intact Immediate Memory Recall: Sock;Blue;Bed Memory Recall Sock: Without Cue Memory Recall Blue: Without Cue Memory Recall Bed: Without Cue Awareness: Appears intact Problem Solving: Appears intact Safety/Judgment: Appears intact Sensation Sensation Light Touch: Appears Intact Coordination Gross Motor Movements are Fluid and Coordinated: Yes Fine Motor Movements are Fluid and Coordinated: Yes Coordination and Movement Description: generalized weakness and deconditioning limiting when fatigued but WFL for ADL Motor  Motor Motor: Abnormal postural alignment and control Motor - Discharge Observations: generalized weakness and deconditioning but significantly improved from eval Mobility  Transfers Sit to Stand: Supervision/Verbal cueing Stand to Sit: Supervision/Verbal cueing  Trunk/Postural Assessment  Cervical Assessment Cervical Assessment: Exceptions to [facility] Thoracic Assessment Thoracic Assessment: Exceptions to [facility] Lumbar Assessment Lumbar Assessment: Exceptions to [facility] Postural Control Postural Control: Within Functional Limits  Balance Balance Balance Assessed: Yes Static Sitting Balance Static  Sitting - Balance Support: Feet supported Static Sitting - Level of Assistance: 6: Modified independent (Device/Increase time) Dynamic Sitting Balance Dynamic Sitting - Balance Support: Feet supported Dynamic Sitting - Level of Assistance: 6: Modified independent (Device/Increase time) Static Standing Balance Static Standing - Balance Support: During functional activity;Bilateral upper extremity supported Static Standing - Level of Assistance: 5: Stand by assistance Dynamic Standing Balance Dynamic Standing -  Balance Support: During functional activity;Bilateral upper extremity supported Dynamic Standing - Level of Assistance: 5: Stand by assistance Dynamic Standing - Balance Activities: Lateral lean/weight shifting;Forward lean/weight shifting;Reaching for objects Extremity/Trunk Assessment RUE Assessment RUE Assessment: Within Functional Limits LUE Assessment LUE Assessment: Within Functional Limits   Viona Gilmore 05/13/2021, 4:39 PM

## 2021-05-13 NOTE — Progress Notes (Signed)
Patient ID: Mikayla Rios, female   DOB: 1986-05-30, 35 y.o.   MRN: [id number]  According to bedside RN wound vac has been discharged after being seen from surgery. Will have wet to dry dressings and both pt and husband will need to be educated prior to discharge.

## 2021-05-13 NOTE — Discharge Summary (Signed)
Physician Discharge Summary  Patient ID: GEANIE PACIFICO MRN: [id number] DOB/AGE: 12-09-1985 35 y.o.  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 EXAM: ABDOMEN - 1 VIEW COMPARISON:  None. FINDINGS: There is an extensive amount of overlying tubing and suction bulb which may obscure a small needle. Large amount of soft tissue gas is noted. This may also obscure needle. No definite evidence of surgical needle is noted. IMPRESSION: Exam is significantly limited due to overlying tubing and suction bulb as well as a large amount of soft tissue gas. No definite radiopaque needle is noted, although this may be obscured due to overlying artifact as described above. These results were called by telephone at the time of interpretation on 04/15/2021 at 4:10 pm to provider Dr. Johney Maine, who verbally acknowledged these results. Electronically Signed   By: Marijo Conception M.D.   On: 04/15/2021 16:11   CT ABDOMEN PELVIS W CONTRAST  Addendum Date: 04/29/2021   ADDENDUM REPORT: 04/29/2021 16:59 ADDENDUM: After discussing the case with Dr. Dwaine Gale in interventional radiology, it was noted that the fluid in the pelvis originally thought to be in the cul-de-sac is likely within the vagina, best seen on sagittal imaging. Recommend speculum exam. Also, in the left lateral wall in the area of prior abscess in the lateral wall abdominal musculature, some of the abdomen soft tissue appears to be enhancing. While this could be infectious, cannot exclude tumor seeding  in the left lateral abdominal wall, measuring 6.9 x 3.8 cm on image 64 of series 2. Electronically Signed   By: Rolm Baptise M.D.   On: 04/29/2021 16:59   Result Date: 04/29/2021 CLINICAL DATA:  Abdominal abscess/infection suspected. History of diverticulitis repair with colostomy and colon cancer EXAM: CT ABDOMEN AND PELVIS WITH CONTRAST TECHNIQUE: Multidetector CT imaging of the abdomen and pelvis was performed using the standard protocol following bolus administration of intravenous contrast. CONTRAST:  51mL OMNIPAQUE IOHEXOL 350 MG/ML SOLN COMPARISON:  04/10/2021 FINDINGS: Lower chest: Small left pleural effusion. Compressive atelectasis in the left lower lobe. Right base atelectasis. Scattered coronary artery calcifications. Hepatobiliary: Gallstones noted within the gallbladder. No focal hepatic abnormality. Pancreas: No focal abnormality or ductal dilatation. Spleen: No focal abnormality.  Normal size. Adrenals/Urinary Tract: Left adrenal mass again noted, measuring 3 cm, unchanged. Right adrenal gland and kidneys unremarkable. Urinary bladder decompressed, grossly unremarkable. Stomach/Bowel: Left lower quadrant ostomy with Hartmann's pouch. No evidence of bowel obstruction. Vascular/Lymphatic: No evidence of aneurysm or adenopathy. Reproductive: Central fibroid with rim calcifications measures up to 4.7 cm. No adnexal mass. Other: Interval removal of previously seen left lower quadrant pigtail drainage catheter. Overall, left lower quadrant fluid collection has improved, but continued fluid collection in the left retroperitoneum, left psoas muscle, and left lateral abdominal wall musculature compatible with residual abscesses. New fluid collection in the cul-de-sac and wrapping around the uterus concerning for abscess. Musculoskeletal: No acute bony abnormality. IMPRESSION: Continued left retroperitoneal abscesses inferior to the left kidney, involving the left psoas muscle and left abdominal wall  musculature. Overall size is decreased since prior study. Interval removal of left lower quadrant abscess drainage catheter.  New fluid collection in the cul-de-sac and wrapping around the uterus concerning for abscess. Cholelithiasis. Small left pleural effusion.  Bibasilar atelectasis. Stable left adrenal adenoma. Electronically Signed: By: Rolm Baptise M.D. On: 04/29/2021 14:29   DG CHEST PORT 1 VIEW  Result Date: 04/16/2021 CLINICAL DATA:  Respiratory failure EXAM: PORTABLE CHEST 1 VIEW COMPARISON:  Chest x-ray 04/15/2021 FINDINGS: Endotracheal tube tip is 2.7 cm above the carina. Enteric tube tip is in the distal stomach region. Right PICC tip is near the superior aspect of the SVC. Heart size and mediastinum are within normal limits. No consolidation or edema. No pleural effusion or pneumothorax. Small subcutaneous emphysema bilaterally appears similar to slightly decreased since previous study. IMPRESSION: Medical devices as described.  No acute process identified. Electronically Signed   By: Ofilia Neas M.D.   On: 04/16/2021 11:46   DG CHEST PORT 1 VIEW  Result Date: 04/15/2021 CLINICAL DATA:  Intubation. EXAM: PORTABLE CHEST 1 VIEW COMPARISON:  Radiographs 04/12/2021 and 04/10/2021.  CT 04/10/2021. FINDINGS: 1713 hours. Tip of the endotracheal tube overlies the mid trachea. An enteric tube projects below the diaphragm into the right upper quadrant of the abdomen, likely in the distal stomach. Right arm PICC projects to the level of the superior SVC. There is mild patient rotation to the right and elevation of the right hemidiaphragm associated with increased right basilar subsegmental atelectasis. The lungs are otherwise clear. There is no pleural effusion or pneumothorax. Suggested soft tissue emphysema within the abdominal flanks bilaterally. IMPRESSION: 1. Satisfactory position of the endotracheal and enteric tubes. 2. Right arm PICC appears to have been pulled back to the level of the  upper SVC. 3. Suggested soft tissue emphysema within both abdominal flanks. Electronically Signed   By: Richardean Sale M.D.   On: 04/15/2021 19:26   VAS [place] LOWER EXTREMITY VENOUS (DVT)  Result Date: 05/04/2021  Lower Venous DVT Study Patient Name:  [name] WEILER Claassen  Date of Exam:   05/03/2021 Medical Rec #: [id number]       Accession #:    [id number] Date of Birth: 04/23/1986        Patient Gender: F Patient Age:   64 years Exam Location:  [facility] Procedure:      VAS [place] LOWER EXTREMITY VENOUS (DVT) Referring Phys: Lauraine Rinne --------------------------------------------------------------------------------  Indications: Swelling.  Comparison Study: No previous exams Performing Technologist: Jody Hill RVT, RDMS  Examination Guidelines: A complete evaluation includes B-mode imaging, spectral Doppler, color Doppler, and power Doppler as needed of all accessible portions of each vessel. Bilateral testing is considered an integral part of a complete examination. Limited examinations for reoccurring indications may be performed as noted. The reflux portion of the exam is performed with the patient in reverse Trendelenburg.  +---------+---------------+---------+-----------+----------+--------------+ RIGHT    CompressibilityPhasicitySpontaneityPropertiesThrombus Aging +---------+---------------+---------+-----------+----------+--------------+ CFV      Full           Yes      Yes                                 +---------+---------------+---------+-----------+----------+--------------+ SFJ      Full                                                        +---------+---------------+---------+-----------+----------+--------------+  FV Prox  Full           Yes      Yes                                 +---------+---------------+---------+-----------+----------+--------------+ FV Mid   Full           Yes      Yes                                  +---------+---------------+---------+-----------+----------+--------------+ FV DistalFull           Yes      Yes                                 +---------+---------------+---------+-----------+----------+--------------+ PFV      Full                                                        +---------+---------------+---------+-----------+----------+--------------+ POP      Full           Yes      Yes                                 +---------+---------------+---------+-----------+----------+--------------+ PTV      Full                                                        +---------+---------------+---------+-----------+----------+--------------+ PERO     Full                                                        +---------+---------------+---------+-----------+----------+--------------+   +---------+---------------+---------+-----------+----------+--------------+ LEFT     CompressibilityPhasicitySpontaneityPropertiesThrombus Aging +---------+---------------+---------+-----------+----------+--------------+ CFV      Full           Yes      Yes                                 +---------+---------------+---------+-----------+----------+--------------+ SFJ      Full                                                        +---------+---------------+---------+-----------+----------+--------------+ FV Prox  Full           Yes      Yes                                 +---------+---------------+---------+-----------+----------+--------------+ FV Mid   Full  Yes      Yes                                 +---------+---------------+---------+-----------+----------+--------------+ FV DistalFull           Yes      Yes                                 +---------+---------------+---------+-----------+----------+--------------+ PFV      Full                                                         +---------+---------------+---------+-----------+----------+--------------+ POP      Full           Yes      Yes                                 +---------+---------------+---------+-----------+----------+--------------+ PTV      Full                                                        +---------+---------------+---------+-----------+----------+--------------+ PERO     Full                                                        +---------+---------------+---------+-----------+----------+--------------+     Summary: BILATERAL: - No evidence of deep vein thrombosis seen in the lower extremities, bilaterally. - No evidence of superficial venous thrombosis in the lower extremities, bilaterally. -No evidence of popliteal cyst, bilaterally.   *See table(s) above for measurements and observations. Electronically signed by Orlie Pollen on 05/04/2021 at 10:21:26 AM.    Final    VAS [place] UPPER EXTREMITY VENOUS DUPLEX  Result Date: 04/20/2021 UPPER VENOUS STUDY  Patient Name:  MASEY SCHEIBER Iannelli  Date of Exam:   04/19/2021 Medical Rec #: [id number]       Accession #:    [id number] Date of Birth: Jan 16, 1986        Patient Gender: F Patient Age:   55 years Exam Location[facility] Procedure:      VAS [place] UPPER EXTREMITY VENOUS DUPLEX Referring Phys: Jolaine Artist MEMON --------------------------------------------------------------------------------  Indications: Edema Limitations: Poor ultrasound/tissue interface and body habitus. Comparison Study: No prior study Performing Technologist: Maudry Mayhew MHA, RDMS, RVT, RDCS  Examination Guidelines: A complete evaluation includes B-mode imaging, spectral Doppler, color Doppler, and power Doppler as needed of all accessible portions of each vessel. Bilateral testing is considered an integral part of a complete examination. Limited examinations for reoccurring indications may be performed as noted.  Right Findings:  +----------+------------+---------+-----------+----------+-------+ RIGHT     CompressiblePhasicitySpontaneousPropertiesSummary +----------+------------+---------+-----------+----------+-------+ Subclavian               Yes       Yes                      +----------+------------+---------+-----------+----------+-------+  Left Findings: +----------+------------+---------+-----------+----------+--------------+ LEFT      CompressiblePhasicitySpontaneousProperties   Summary     +----------+------------+---------+-----------+----------+--------------+ IJV           Full       Yes       Yes                             +----------+------------+---------+-----------+----------+--------------+ Subclavian    Full       Yes       Yes                             +----------+------------+---------+-----------+----------+--------------+ Axillary      Full       Yes       Yes                             +----------+------------+---------+-----------+----------+--------------+ Brachial      Full       Yes       Yes                             +----------+------------+---------+-----------+----------+--------------+ Radial        Full                                                 +----------+------------+---------+-----------+----------+--------------+ Ulnar         Full                                                 +----------+------------+---------+-----------+----------+--------------+ Cephalic      Full                                                 +----------+------------+---------+-----------+----------+--------------+ Basilic                                             Not visualized +----------+------------+---------+-----------+----------+--------------+  Summary:  Right: No evidence of thrombosis in the subclavian.  Left: No evidence of deep vein thrombosis in the upper extremity. No evidence of superficial vein thrombosis involving  visualized veins of the left upper extremity.  *See table(s) above for measurements and observations.  Diagnosing physician: Monica Martinez MD Electronically signed by Monica Martinez MD on 04/20/2021 at 11:59:48 AM.    Final    [place] EKG Site Rite  Result Date: 04/17/2021 If Site Rite image not attached, placement could not be confirmed due to current cardiac rhythm.   Labs:  Basic Metabolic Panel: Recent Labs  Lab 05/11/21 0529 05/14/21 0513  NA 137 135  K 3.5 3.1*  CL 97* 96*  CO2 30 30  GLUCOSE 93 94  BUN 7 9  CREATININE 0.62 0.73  CALCIUM 8.9 8.9    CBC: Recent Labs  Lab 05/11/21 0529  WBC 8.2  NEUTROABS 5.3  HGB 10.4*  HCT 32.3*  MCV 87.3  PLT 557*    CBG: No results for input(s): GLUCAP in the last 168 hours.  Family history.  Mother with breast cancer diabetes hypertension and colon cancer.  Father with congestive heart failure and hypertension.  Maternal grandmother with diabetes.  Denies any rectal cancer or pancreatic cancer  Brief HPI:   Mikayla Rios is a 35 y.o. right-handed female with history of hypertension obesity with BMI 39.99 inappropriate sinus tachycardia followed by Dr. Gardiner Rhyme.  Per chart review lives with spouse.  Two-level home.  Independent prior to admission.  Patient recently hospitalized [facility] 5/31 until 6/6 for abdominal pain with initial work-up showing perforated diverticulitis.  She was initially admitted to the surgical service treated with intravenous antibiotics and bowel rest.  Patient had worsening leukocytosis repeat CT scan showed diverticular abscess amenable to drainage and IR consulted underwent CT-guided drain placement 11/07/2020.  She was discharged to home ambulating extended distances.  Presented 04/10/2021 with progressive generalized weakness and bouts of lightheadedness.  She had decrease in appetite and gradual weight loss.  She had been scheduled to see gastroenterology 10/6 with eventual plan to undergo  endoscopic work-up to rule out any malignancy.  Upon evaluation in the emergency department underwent CT imaging of the chest abdomen pelvis.  CT angiogram of the chest showed no evidence of pulmonary emboli however CT imaging of abdomen pelvis did reveal new enhancing fluid collection posterior to segment of inflamed colon measuring 8.5 x 3.5 x 10 cm invading the adjacent iliopsoas muscle as well as extending through the left lateral abdominal wall.  Posterior drain was noted to be in the middle of this abscess.  The anterior drain appeared to be completely dislodged into the subcutaneous tissue.  This did show dramatic change compared to patient recent CT imaging of abdomen pelvis 10/28.  Patient also found to have multiple SIRS criteria with concern for early sepsis and therefore was given 1 L bolus of lactated Ringer's.  She was hypokalemic with intravenous potassium chloride initiated.  General surgery consulted as well as interventional radiology gastroenterology also consulted patient underwent drainage tube exchange 04/13/2021.  She underwent laparoscopic-assisted [facility] [place] left colectomy and colostomy mobilization of splenic flexure of colon and small bowel resection left retroperitoneal flank abscess drainage for chronic perforated colon with retroperitoneal abscess partial small bowel obstruction and colon obstruction 04/15/2021 per Dr. Johney Maine and pathology obtained diagnosed pT4NO stage II sigmoid colon cancer patient did require vasopressor therapy postoperatively and weaned off pressors.  Leukocytosis 12,600 follow-up CT of abdomen 04/29/2021 showed continued left retroperitoneal abscess inferior to the left kidney overall size decreased since prior study.  New fluid collection in the cul-de-sac and wrapping around the uterus concerning for abscess reviewed by gynecology and did not feel clinically significant amount of fluid that would need intervention and maintained on empiric antibiotics.  Wound care  ongoing with colostomy in place.  Required mechanical ventilation postoperative for acute respiratory failure extubated 11/10.  Bouts of prolonged QTC from hypokalemia and hypomagnesemia with supplement added.  Repeat EKG showed improvement in QTC.  Echocardiogram with ejection fraction of 60 to 65% no wall motion abnormalities.  Dr. [name] of medical oncology consulted.  Plan is for adjuvant chemotherapy to be addressed once recovered from latest surgery and rehabilitation.  Hospital course acute blood loss anemia transfused 2 units packed red blood cells FFP 04/19/2021 latest hemoglobin 10.8.  She did initially receive TPN for nutritional support.  Therapy  evaluations completed due to patient decreased functional mobility was admitted for a comprehensive rehab program   Hospital Course: NAYDENE KAMROWSKI was admitted to rehab 05/01/2021 for inpatient therapies to consist of PT, ST and OT at least three hours five days a week. Past admission physiatrist, therapy team and rehab RN have worked together to provide customized collaborative inpatient rehab.  Pertain to patient's debility related to colonic adenocarcinoma status post laparoscopic-assisted [place] left colectomy and colostomy small bowel resection left retroperitoneal flank abscess drainage 04/15/2021.  JP drain removed 04/28/2021.  Routine colostomy care as advised she would follow-up outpatient medical oncology for systemic chemotherapy.  Her wound VAC was discontinued.  Lovenox for DVT prophylaxis venous Doppler studies negative.  Acute blood loss anemia stable no bleeding episodes latest hemoglobin 10.4.  Hospital course complicated by septic shock weaned from pressors as well as acute respiratory failure extubated 11/10 oxygen saturations maintained 90%.  History of inappropriate sinus tachycardia followed by cardiology services continue beta-blocker as advised no chest pain or shortness of breath.  Her Norvasc as well as Zestoretic remained on hold  due to soft blood pressures and could follow-up outpatient.  Decreased nutritional storage with follow-up per dietary services.   Blood pressures were monitored on TID basis and soft and monitored     Rehab course: During patient's stay in rehab weekly team conferences were held to monitor patient's progress, set goals and discuss barriers to discharge. At admission, patient required minimal guard 22 feet rolling walker max assist sit to supine  Physical exam.  Blood pressure 134/97 pulse 107 temperature 98.2 respirations 20 oxygen saturations 100% room air Constitutional.  No acute distress HEENT Head.  Normocephalic and atraumatic Eyes.  Pupils round and reactive to light no discharge.nystagmus Neck.  Supple nontender no JVD without thyromegaly Cardiac regular rate rhythm any extra sounds or murmur heard Abdomen.  Soft nontender positive bowel sounds colostomy in place Respiratory effort normal no respiratory distress without wheeze Skin.  No evidence of breakdown or rash Neurologic.  Motor strength 5/5 in bilateral deltoid bicep tricep grip 4 - hip flexors knee extensors ankle dorsiflexor and plantar flexor.  Sensation intact  He/She  has had improvement in activity tolerance, balance, postural control as well as ability to compensate for deficits. He/She has had improvement in functional use RUE/LUE  and RLE/LLE as well as improvement in awareness.  Supine to sit with head of bed elevated supervision.  Patient was able to empty her own ostomy bag.  Stood from low bed with contact-guard.  Supervision gait to the bathroom.  Navigated 6 inch steps contact-guard.  Donned shoes with set up.  Ambulates extended distances with a rolling walker.  Toilet transfers performed.  Care standing supervision.  Full family teaching completed plan discharged to home       Disposition: Discharge to home    Diet: Carb modified  Special Instructions: No driving smoking or alcohol  Routine  colostomy care  Wet-to-dry dressings right lower quadrant abdominal incision daily as needed  Okay to shower with wound open.  Wet-to-dry dressings to abdominal incision daily as needed  Plan follow-up outpatient medical oncology for systemic chemotherapy  Medications at discharge 1.  Tylenol as needed 2.  Voltaren gel 4 g 4 times daily as needed 3.  Lasix 40 mg p.o. daily 4.  Ativan 0.5-1 mg every 8 hours as needed anxiety 5.  Robaxin 1000 mg every 6 hours as needed muscle spasms 6.  Lopressor 50 mg p.o. twice daily  7.  Protonix 40 mg p.o. daily 8.  Klor-Con 20 mg p.o. daily 9.  Oxycodone 10 mg every 4 hours as needed pain  30-35 minutes were spent completing discharge summary and discharge planning     Follow-up Information     Lovorn, Jinny Blossom, MD Follow up.   Specialty: Physical Medicine and Rehabilitation Why: Only as directed Contact information: [address] [place] [address] [phone]         [name], MD Follow up.   Specialties: Cardiology, Radiology Why: Call for appointment Contact information: [address] [place] [address] [phone]         [name], MD Follow up.   Specialties: General Surgery, Colon and Rectal Surgery Why: Call for appointment Contact information: [address] [place] [address] [phone]         [name], MD Follow up.   Specialties: Hematology, Oncology Why: Call for appointment Contact information: [place] [address] [phone]                 Signed: Lavon Paganini [place] 05/15/2021, 4:55 AM

## 2021-05-14 LAB — COMPREHENSIVE METABOLIC PANEL
ALT: 11 U/L (ref 0–44)
AST: 16 U/L (ref 15–41)
Albumin: 2.6 g/dL — ABNORMAL LOW (ref 3.5–5.0)
Alkaline Phosphatase: 35 U/L — ABNORMAL LOW (ref 38–126)
Anion gap: 9 (ref 5–15)
BUN: 9 mg/dL (ref 6–20)
CO2: 30 mmol/L (ref 22–32)
Calcium: 8.9 mg/dL (ref 8.9–10.3)
Chloride: 96 mmol/L — ABNORMAL LOW (ref 98–111)
Creatinine, Ser: 0.73 mg/dL (ref 0.44–1.00)
GFR, Estimated: >60 mL/min (ref >60)
Glucose, Bld: 94 mg/dL (ref 70–99)
Potassium: 3.1 mmol/L — ABNORMAL LOW (ref 3.5–5.1)
Sodium: 135 mmol/L (ref 135–145)
Total Bilirubin: 0.6 mg/dL (ref 0.3–1.2)
Total Protein: 6.6 g/dL (ref 6.5–8.1)

## 2021-05-14 MED ORDER — POTASSIUM CHLORIDE CRYS ER 20 MEQ PO TBCR
40.0000 meq | EXTENDED_RELEASE_TABLET | Freq: Two times a day (BID) | ORAL | Status: AC
  Administered 2021-05-14 (×2): 40 meq via ORAL
  Filled 2021-05-14 (×2): qty 2

## 2021-05-14 MED ORDER — METOPROLOL TARTRATE 50 MG PO TABS: 50.0000 mg | ORAL_TABLET | Freq: Two times a day (BID) | ORAL | 0 refills | Status: DC

## 2021-05-14 MED ORDER — POTASSIUM CHLORIDE CRYS ER 20 MEQ PO TBCR: 20.0000 meq | EXTENDED_RELEASE_TABLET | Freq: Every day | ORAL | 0 refills | Status: DC

## 2021-05-14 MED ORDER — OXYCODONE HCL 10 MG PO TABS
10.0000 mg | ORAL_TABLET | ORAL | 0 refills | Status: DC | PRN
Start: 2021-05-14 — End: 2021-05-25

## 2021-05-14 MED ORDER — METHOCARBAMOL 1000 MG PO TABS: 1000.0000 mg | ORAL_TABLET | Freq: Four times a day (QID) | ORAL | 0 refills | Status: DC

## 2021-05-14 MED ORDER — PANTOPRAZOLE SODIUM 40 MG PO TBEC: 40.0000 mg | DELAYED_RELEASE_TABLET | Freq: Every day | ORAL | 0 refills | Status: DC

## 2021-05-14 MED ORDER — DICLOFENAC SODIUM 1 % EX GEL: 4.0000 g | Freq: Four times a day (QID) | CUTANEOUS | 0 refills | Status: DC | PRN

## 2021-05-14 MED ORDER — ACETAMINOPHEN 325 MG PO TABS: 325.0000 mg | ORAL_TABLET | ORAL | Status: DC | PRN

## 2021-05-14 MED ORDER — FUROSEMIDE 20 MG PO TABS: 20.0000 mg | ORAL_TABLET | Freq: Every day | ORAL | 0 refills | Status: DC

## 2021-05-14 NOTE — Progress Notes (Addendum)
Patient/Family Teaching  Patient stated she already knew how to do dressing change and she would prefer to demonstrate in the morning prior to leaving tomorrow; due to dressing being changed BID today due to shower. Patient was able to mimic steps back on how to perform dressing change.  Patient to provided return demonstration 12/9 Husband to observe and be taught dressing change prior to patient's discharge tomorrow. Yehuda Mao, LPN

## 2021-05-14 NOTE — Progress Notes (Signed)
Physical Therapy Discharge Summary  Patient Details  Name: Mikayla Rios MRN: [id number] Date of Birth: 05-16-1986  Today's Date: 05/14/2021 PT Individual Time: 8416-6063, 0160-1093, 2355-7322 PT Individual Time Calculation (min): 72 min, 43 min, 22 min   Patient has met 8 of 9 long term goals due to improved activity tolerance, improved balance, increased strength, ability to compensate for deficits, and improved coordination.  Patient to discharge at an ambulatory level Supervision, mod I for short distances with RW.   Patient's care partner is independent to provide the necessary physical assistance at discharge. Pt ambulates with RW mod I, over 300 ft. Pt able to navigate household distances with no AD and supervision and has been educated on appropriate times for both. Pt performs stairs with R hand rail and HHA, supervision with BIL hand rails. Pt requires min A to navigate car transfer in her vehicle d/t it's extreme height, but could navigate with supervision in smaller car. Pt's husband, Roderick Pee, participated in family education and demoed appropriate guarding and assist when necessary.   Reasons goals not met: Pt requires min A for car transfer d/t height of their vehicle. Would meet goal level with sedan.  Recommendation:  Patient will benefit from ongoing skilled PT services in outpatient setting to continue to advance safe functional mobility, address ongoing impairments in strength, balance, functional mobility, decrease dependence on AD, and minimize fall risk.  Equipment: RW  Reasons for discharge: treatment goals met and discharge from hospital  Patient/family agrees with progress made and goals achieved: Yes  Skilled Therapeutic Interventions/Progress Updates:  Session 1: pt received in bed and agreeable to therapy. Reports 5/10 pain in abdomen, premedicated. Supine>sit with supervision and bed rail. Pt ambulated to therapy gym with RW, mod I for endurance and functional  mobility. Pt performed car transfer with mod A fading to min A. Extensive problem solving required as pt's vehicle is extremely high and she required assist PTA. After car transfer, pt retrieved object from floor, mod I. Pt navigated ramp and mulch with supervision, curb step with CGA. Pt performed TUG at 25 sec, 18 sec, and 16 sec. Pt was transported back to room for time and returned to bed with supervision, was left with all needs in reach and alarm active.   Session 2: pt received in bed and agreeable to therapy. Pt reports 6/10 pain, having just received medication prior to session. Sit>supine with supervision and bed rail. ambulatory transfer to bathroom for continent urine void, distant supervision for 3/3 toileting tasks. Pt was transported to [facility] entrance to meet pt's husband, Roderick Pee, for family education with car transfer. Attempted several variations for car transfer to problem solve d/t extreme SUV height. Pt was able to navigate with min A and Errol provided appropriate assist throughout. Pt returned to room and performed supervision ambulatory transfer to EOB at close of session, was left with all needs in reach and alarm active.   Session 3: pt received EOB and agreeable to therapy. Pt reports pain well controlled with medication. Session focused on family education for stair management. ambulatory transfer to w/c with supervision. Pt transported to therapy gym for time management and energy conservation. Pt navigated 6" steps 2 x 8 with seated rest break with therapist and then Errol providing HHA to mimic home environment both pt and Errol demoed safe technique and appropriate assist. Pt then returned to room and returned to EOB in the same manner.   PT Discharge Precautions/Restrictions Precautions Precautions: Fall Precaution Comments: colostomy,  wound vac Restrictions Weight Bearing Restrictions: No Vital Signs Therapy Vitals Temp: 98.3 F (36.8 C) Pulse Rate: 94 Resp: 18 BP:  130/85 Patient Position (if appropriate): Lying Oxygen Therapy O2 Device: Room Air  Pain Interference Pain Interference Pain Effect on Sleep: 1. Rarely or not at all Pain Interference with Therapy Activities: 1. Rarely or not at all Pain Interference with Day-to-Day Activities: 1. Rarely or not at all Vision/Perception  Perception Perception: Within Functional Limits Praxis Praxis: Intact  Cognition Overall Cognitive Status: Within Functional Limits for tasks assessed Arousal/Alertness: Awake/alert Orientation Level: Oriented X4 Year: 2022 Month: December Day of Week: Correct Focused Attention: Appears intact Sustained Attention: Appears intact Selective Attention: Appears intact Memory: Appears intact Awareness: Appears intact Problem Solving: Appears intact Safety/Judgment: Appears intact Sensation Sensation Light Touch: Appears Intact Proprioception: Appears Intact Coordination Gross Motor Movements are Fluid and Coordinated: Yes Coordination and Movement Description: generalized weakness and deconditioning limiting when fatigued but greatly improved from baseline Motor  Motor Motor: Abnormal postural alignment and control Motor - Skilled Clinical Observations: generalized weakness and deconditioning Motor - Discharge Observations: generalized weakness and deconditioning but significantly improved from eval  Mobility Bed Mobility Bed Mobility: Sit to Supine;Supine to Sit Supine to Sit: Supervision/Verbal cueing Sit to Supine: Supervision/Verbal cueing Transfers Transfers: Sit to Stand;Stand to Sit;Stand Pivot Transfers Sit to Stand: Independent with assistive device Stand to Sit: Independent with assistive device Stand Pivot Transfers: Independent with assistive device Transfer (Assistive device): Rolling walker Locomotion  Gait Ambulation: Yes Gait Assistance: Independent with assistive device Gait Distance (Feet): 300 Feet Assistive device: Rolling  walker Gait Assistance Details: supervision for balance when not using AD, Gait Gait: Yes Gait Pattern: Impaired Gait Pattern: Decreased step length - left;Step-through pattern Gait velocity: decreased Stairs / Additional Locomotion Stairs: Yes Stairs Assistance: Contact Guard/Touching assist Stair Management Technique: Two rails;Step to pattern Number of Stairs: 16 Height of Stairs: 6 Ramp: Supervision/Verbal cueing Curb: Contact Guard/Touching assist Wheelchair Mobility Wheelchair Mobility: No  Trunk/Postural Assessment  Cervical Assessment Cervical Assessment: Within Functional Limits Thoracic Assessment Thoracic Assessment: Within Functional Limits Lumbar Assessment Lumbar Assessment: Within Functional Limits Postural Control Postural Control: Within Functional Limits Postural Limitations: mildly decreased, pt able to maintain w/o UE support  Balance Balance Balance Assessed: Yes Static Sitting Balance Static Sitting - Balance Support: Feet supported Static Sitting - Level of Assistance: 6: Modified independent (Device/Increase time) Dynamic Sitting Balance Dynamic Sitting - Balance Support: Feet supported Dynamic Sitting - Level of Assistance: 6: Modified independent (Device/Increase time) Static Standing Balance Static Standing - Balance Support: During functional activity;Bilateral upper extremity supported Static Standing - Level of Assistance: 5: Stand by assistance Dynamic Standing Balance Dynamic Standing - Balance Support: During functional activity;Bilateral upper extremity supported Dynamic Standing - Level of Assistance: 5: Stand by assistance Extremity Assessment      RLE Assessment RLE Assessment: Exceptions to [facility] Active Range of Motion (AROM) Comments: WFL RLE Strength Right Hip Flexion: 4-/5 Right Hip Extension: 4-/5 Right Knee Flexion: 4/5 Right Knee Extension: 4/5 Right Ankle Dorsiflexion: 4/5 Right Ankle Plantar Flexion: 4/5 LLE  Assessment LLE Assessment: Exceptions to [facility] Active Range of Motion (AROM) Comments: WFL LLE Strength Left Hip Flexion: 4-/5 Left Hip Extension: 4-/5 Left Knee Flexion: 4-/5 Left Knee Extension: 4-/5 Left Ankle Dorsiflexion: 4/5 Left Ankle Plantar Flexion: 4/5     C  05/14/2021, 8:27 AM

## 2021-05-14 NOTE — Progress Notes (Signed)
Inpatient Rehabilitation Discharge Medication Review by a Pharmacist  A complete drug regimen review was completed for this patient to identify any potential clinically significant medication issues.  High Risk Drug Classes Is patient taking? Indication by Medication  Antipsychotic No   Anticoagulant No   Antibiotic No   Opioid Yes OxyIR- pain   Antiplatelet No   Hypoglycemics/insulin No   Vasoactive Medication Yes Lasix- small left pleural effusion  lopressor- HTN   Chemotherapy No   Other Yes Voltaren gel, robaxin- pain  Protonix- GERD     Type of Medication Issue Identified Description of Issue Recommendation(s)  Drug Interaction(s) (clinically significant)     Duplicate Therapy     Allergy     No Medication Administration End Date     Incorrect Dose     Additional Drug Therapy Needed     Significant med changes from prior encounter (inform family/care partners about these prior to discharge).    Other       Clinically significant medication issues were identified that warrant physician communication and completion of prescribed/recommended actions by midnight of the next day:  No  Time spent performing this drug regimen review (minutes):  30   Illiana Losurdo BS, PharmD, BCPS Clinical Pharmacist 05/14/2021 10:08 AM

## 2021-05-14 NOTE — Progress Notes (Signed)
PROGRESS NOTE   Subjective/Complaints:  Pt reports they got rid of VAC yesterday-   Robaxin being scheduled helped pain some- still really sore in abd with movement- we discussed using pillow against her abdomen to help the intraabdominal pressure with sitting up/standing, etc.    ROS:   Pt denies SOB, abd pain, CP, N/V/C/D, and vision changes     Objective:   No results found. No results for input(s): WBC, HGB, HCT, PLT in the last 72 hours.  Recent Labs    05/14/21 0513  NA 135  K 3.1*  CL 96*  CO2 30  GLUCOSE 94  BUN 9  CREATININE 0.73  CALCIUM 8.9    Intake/Output Summary (Last 24 hours) at 05/14/2021 0909 Last data filed at 05/14/2021 0857 Gross per 24 hour  Intake 720 ml  Output --  Net 720 ml        Physical Exam: Vital Signs Blood pressure 130/85, pulse 94, temperature 98.3 F (36.8 C), resp. rate 18, height 5\' 3"  (1.6 m), weight 93.2 kg, SpO2 97 %.      General: awake, alert, appropriate,  sitting up on phone with mother; NAD HENT: conjugate gaze; oropharynx moist CV: regular rate in 90's; no JVD Pulmonary: CTA B/L; no W/R/R- good air movement GI: soft, NT, ND, (+)BS; VAC off; Colostomy putting out soft stool Psychiatric: appropriate- sad affect Neurological: Ox3   Motor: Bilateral lower extremities: Grossly 4 -/5 throughout, unchanged  Assessment/Plan: 1. Functional deficits which require 3+ hours per day of interdisciplinary therapy in a comprehensive inpatient rehab setting. Physiatrist is providing close team supervision and 24 hour management of active medical problems listed below. Physiatrist and rehab team continue to assess barriers to discharge/monitor patient progress toward functional and medical goals  Care Tool:  Bathing    Body parts bathed by patient: Right arm, Left arm, Chest, Abdomen, Front perineal area, Buttocks, Right upper leg, Left upper leg, Face, Right  lower leg, Left lower leg   Body parts bathed by helper: Right lower leg, Left lower leg     Bathing assist Assist Level: Set up assist     Upper Body Dressing/Undressing Upper body dressing   What is the patient wearing?: Pull over shirt    Upper body assist Assist Level: Set up assist    Lower Body Dressing/Undressing Lower body dressing      What is the patient wearing?: Pants     Lower body assist Assist for lower body dressing: Supervision/Verbal cueing     Toileting Toileting    Toileting assist Assist for toileting: Supervision/Verbal cueing     Transfers Chair/bed transfer  Transfers assist     Chair/bed transfer assist level: Supervision/Verbal cueing Chair/bed transfer assistive device: Programmer, multimedia   Ambulation assist   Ambulation activity did not occur: Safety/medical concerns (requires use of RW, which pt did not use at baseline and pt reports feeling unsafe to attempt gait without RW today)  Assist level: Contact Guard/Touching assist Assistive device: Walker-rolling Max distance: 20   Walk 10 feet activity   Assist  Walk 10 feet activity did not occur: Safety/medical concerns  Assist level: Contact Guard/Touching assist  Assistive device: Walker-rolling   Walk 50 feet activity   Assist Walk 50 feet with 2 turns activity did not occur: Safety/medical concerns  Assist level: Contact Guard/Touching assist Assistive device: Walker-rolling    Walk 150 feet activity   Assist Walk 150 feet activity did not occur: Safety/medical concerns  Assist level: Contact Guard/Touching assist Assistive device: Walker-rolling    Walk 10 feet on uneven surface  activity   Assist Walk 10 feet on uneven surfaces activity did not occur: Safety/medical concerns         Wheelchair     Assist Is the patient using a wheelchair?: Yes Type of Wheelchair: Manual    Wheelchair assist level: Dependent - Patient 0%       Wheelchair 50 feet with 2 turns activity    Assist        Assist Level: Dependent - Patient 0%   Wheelchair 150 feet activity     Assist      Assist Level: Dependent - Patient 0%   Blood pressure 130/85, pulse 94, temperature 98.3 F (36.8 C), resp. rate 18, height 5\' 3"  (1.6 m), weight 93.2 kg, SpO2 97 %.    Medical Problem List and Plan: 1.  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  Status post laparoscopic-assisted Hartmann left colectomy/end colostomy, small bowel resection, left retroperitoneal/flank abscess drainage 04/15/2021.  JP drain removed 04/28/2021.  Colostomy care as directed/wound [facility] Monday Wednesday Friday.  Plan outpatient follow-up medical oncology for systemic chemotherapy  D/c 05/15/21 12/8 - con't CIR_ PT and OT_ d/c tomorrow- VAC off- knows how to change colostomy- will arrange for shower today if possible.  2. Impaired mobility: vascular ultrasound reviewed and negative for clot -DVT/anticoagulation:  Mechanical: Antiembolism stockings, thigh (TED hose) Bilateral lower extremities.   Started Lovenox since Hb stable.  12/6- Hb 10.4- stable 12/7- pt walking 150 ft- will not send home on Lovenox, but continue until d/c.              -antiplatelet therapy: N/A 3. Pain: continue Voltaren gel 4 times daily, oxycodone/Robaxin as needed  12/7- change Robaxin to 1000 mg q6 hours scheduled- since so helpful.  12/8- robaxin scheduled helped- showed pt how to use pillow to push on abd with sitting and sit to stand.  4. Mood: Ativan as needed  12/2- con't Ativan prn- not taking a lot.              -antipsychotic agents: N/A 5. Neuropsych: This patient is capable of making decisions on her own behalf. 6. Skin/Wound Care: Routine skin checks 7. Fluids/Electrolytes/Nutrition: Routine in and outs with follow-up chemistries 8.  Acute blood loss anemia.  Last transfusion 11/19.    Hemoglobin 9.9 on 12/1, trending down, labs ordered for  tomorrow  12/6- Hb up to 10.4- con't to monitor 9.  Septic shock.  Weaned from pressors 10.  Acute respiratory failure.  Extubated 11/10.  Monitor oxygen saturations. 11.  History of inappropriate sinus tachycardia.  Followed by cardiology service Dr. Gardiner Rhyme.  Continue Lopressor 25 mg twice daily Mildly elevated on 12/3 12.  Anasarca with moderate protein malnutrtion.  Related to hypoalbuminemia and aggressive volume repletion.  Continue Lasix.  12/1- Alb up to 2.6- will con't to monitor- low was 2.3.  12/6- Alb up to 2.6-   12/7- will decrease Lasix to 20 mg daily- since swelling is better- will recheck labs in AM for borderline low K+ of 3.5  13.  Obesity.  BMI 39.99.  Dietary  follow-up 14.  Leukocytosis.  Resolved completed Cipro and Flagyl.  Follow-up per general surgery 15. Hypokalemia:   Since on Lasix, 20 mEq daily as of tomorrow.   Potassium 4.0 on 12/1, labs ordered for tomorrow  12/8- K+ 3.1- already decreased Lasix to 20 mg daily as of this AM- will replete KCL 40 mEq x2 and recheck in AM.  16.  Sinus tachycardia- per pt chronic issue Vitals:   05/13/21 2029 05/14/21 0544  BP: 138/88 130/85  Pulse: (!) 103 94  Resp: 16 18  Temp: 98.4 F (36.9 C) 98.3 F (36.8 C)  SpO2: 97%      ECG from 04/2021 reviewed showing sinus tachycardia with prolonged QTC, repeat ECG ordered  Metoprolol increased to 50mg  BID on 12/5  12/6- HR 98-110- just got increase of metoprolol so will let this dose continue for now.   12/7- HR 99-107 in last 24 hours- will hold on increasing Metoprolol since BP 711 systolic this AM.   LOS: 13 days A FACE TO FACE EVALUATION WAS PERFORMED  Koren Sermersheim 05/14/2021, 9:09 AM

## 2021-05-14 NOTE — Progress Notes (Signed)
Patient ID: Mikayla Rios, female   DOB: 1985-07-01, 35 y.o.   MRN: [id number]  Pt has all equipment in room and made aware home health will still come out for [facility] and PT will contact once home to set up appointments. Will check on am to see if any last minute questions or concerns. Pt is excited to go home and see her babies.

## 2021-05-14 NOTE — Plan of Care (Signed)
  Problem: RH Balance Goal: LTG Patient will maintain dynamic standing with ADLs (OT) Description: LTG:  Patient will maintain dynamic standing balance with assist during activities of daily living (OT)  Outcome: Completed/Met   Problem: Sit to Stand Goal: LTG:  Patient will perform sit to stand in prep for activites of daily living with assistance level (OT) Description: LTG:  Patient will perform sit to stand in prep for activites of daily living with assistance level (OT) Outcome: Completed/Met   Problem: RH Grooming Goal: LTG Patient will perform grooming w/assist,cues/equip (OT) Description: LTG: Patient will perform grooming with assist, with/without cues using equipment (OT) Outcome: Completed/Met   Problem: RH Bathing Goal: LTG Patient will bathe all body parts with assist levels (OT) Description: LTG: Patient will bathe all body parts with assist levels (OT) Outcome: Completed/Met   Problem: RH Dressing Goal: LTG Patient will perform upper body dressing (OT) Description: LTG Patient will perform upper body dressing with assist, with/without cues (OT). Outcome: Completed/Met Goal: LTG Patient will perform lower body dressing w/assist (OT) Description: LTG: Patient will perform lower body dressing with assist, with/without cues in positioning using equipment (OT) Outcome: Completed/Met   Problem: RH Toileting Goal: LTG Patient will perform toileting task (3/3 steps) with assistance level (OT) Description: LTG: Patient will perform toileting task (3/3 steps) with assistance level (OT)  Outcome: Completed/Met   Problem: RH Toilet Transfers Goal: LTG Patient will perform toilet transfers w/assist (OT) Description: LTG: Patient will perform toilet transfers with assist, with/without cues using equipment (OT) Outcome: Completed/Met

## 2021-05-14 NOTE — Progress Notes (Signed)
Occupational Therapy Session Note  Patient Details  Name: Mikayla Rios MRN: [id number] Date of Birth: Dec 22, 1985  Today's Date: 05/14/2021 OT Individual Time: 1337-1430 OT Individual Time Calculation (min): 53 min    Short Term Goals: Week 1:  OT Short Term Goal 1 (Week 1): Pt will complete toileting with Min A for sit<stands OT Short Term Goal 1 - Progress (Week 1): Met OT Short Term Goal 2 (Week 1): Pt will complete LB dressing with Min A using AE as needed OT Short Term Goal 2 - Progress (Week 1): Met OT Short Term Goal 3 (Week 1): Pt will complete oral care while standing at the sink without seated rest to increase activity tolerance OT Short Term Goal 3 - Progress (Week 1): Progressing toward goal Week 2:  OT Short Term Goal 1 (Week 2): STGs = LTGs 2/2 ELOS at Supervision  Skilled Therapeutic Interventions/Progress Updates:    Pt received seated EOB with family present, agreeable to therapy. Session focus on self-care retraining, activity tolerance, transfer retraining in prep for improved ADL/IADL/func mobility performance + decreased caregiver burden. Ambulatory TTB transfer with RW and close S. Covered ostomy bag/abdominal incision with shower shield. Doffed/donned dress in standing with S in stance. Pt able to bath full-body with use of LH sponge at S level, sit to stand to reach buttocks with unilateral support on grab bar. Doffed/donned gripper socks with mod I via figure 4. Seated in w/c at sink , applied lotion with mod I. Dressing noted to be wet, LPN notified and present to change. Pt left seated EOB with LPN and family present, call bell in reach, and all immediate needs met.    Therapy Documentation Precautions:  Precautions Precautions: Fall Precaution Comments: colostomy, wound vac Restrictions Weight Bearing Restrictions: No  Pain: reports abdominal pain upon sit to stand, did not rate and reports being pre-medicated   ADL: See Care Tool for more  details.  Therapy/Group: Individual Therapy  Volanda Napoleon MS, OTR/L  05/14/2021, 6:52 AM

## 2021-05-14 NOTE — Progress Notes (Signed)
Occupational Therapy Session Note  Patient Details  Name: Mikayla Rios MRN: [id number] Date of Birth: [birth year]  Today's Date: 05/14/2021 OT Group Time: 1435-1530 OT Group Time Calculation (min): 55 min  and Today's Date: 05/14/2021 OT Missed Time: 5 Minutes Missed Time Reason: Nursing care;Other (comment) (dressing change with nursing)   Short Term Goals: Week 2:  OT Short Term Goal 1 (Week 2): STGs = LTGs 2/2 ELOS at Supervision  Skilled Therapeutic Interventions/Progress Updates:  Pt participated in group session with a focus on BUE strength and endurance to facilitate improved activity tolerance and strength for higher level BADLs and functional mobility tasks. Pt first in engaged in seated UB therapeutic activity where pt was instructed to roll large dice when it was pts turn. Each number rolled correlated with UB therex/ number of repetitions  listed on board. UB therex included flys, chest presses, punches, bicep curls, upward rows, over heard presses with a range of reps from 10-20.  Pt utilized 1 lb hand weights during session. Pt utilizes modifications such as decreasing amt of weight as needed, completing therex unilaterally and taking rest breaks as needed. Education provided on importance of determining appropriate modifications that benefited each pt in order to meet pts specific needs.  Ended with 2 mins of guided deep breathing where pts were instructed on sequence of deep breathing and benefits of deep breathing to accommodate for stress, anxiety and relaxation. Remainder of session to focus on BUE strength and endurance with pts passing ball around circle. Graded task up with pts instructing to complete chest passes's, volley passes, and  higher level motor planning pass with pt tossing ball up in air, clapping once and passing to next group participant. Pt transported back to room by her husband.   Therapy Documentation Precautions:  Precautions Precautions: Fall Precaution  Comments: colostomy, wound vac Restrictions Weight Bearing Restrictions: No  Pain: no pain reported during session     Therapy/Group: Group Therapy  Precious Haws 05/14/2021, 4:04 PM

## 2021-05-14 NOTE — Progress Notes (Signed)
Inpatient Rehabilitation Care Coordinator Discharge Note   Patient Details  Name: Mikayla Rios MRN: [id number] Date of Birth: 24-Aug-1985   Discharge location: [place] DURING THE DAY WHILE HUSBAND WORKS  Length of Stay:  14 DAYS  Discharge activity level: [place]  Home/community participation: ACTIVE  Patient response FI:EPPIRJ Literacy - How often do you need to have someone help you when you read instructions, pamphlets, or other written material from your doctor or pharmacy?: Never  Patient response JO:ACZYSA Isolation - How often do you feel lonely or isolated from those around you?: Never  Services provided included: MD, RD, PT, OT, RN, CM, TR, Pharmacy, SW  Financial Services:  Charity fundraiser Utilized: Herbal[employer]  Choices offered to/list presented to: PT  Follow-up services arranged:  Home Health, DME, Patient/Family has no preference for HH/DME agencies [facility]    DME : [facility], TUB [facility]EN[facility] AND 3 IN 1    Patient response to transportation need: Is the patient able to respond to transportation needs?: Yes In the past 12 months, has lack of transportation kept you from medical appointments or from getting medications?: No In the past 12 months, has lack of transportation kept you from meetings, work, or from getting things needed for daily living?: No    Comments (or additional information): PT DID WELL AND REACHED HER GOALS AND DID WELL. WOUND VAC DISCHARGED AND CHANGED TO WET TO DRY DRESSING. HUSBAND HAS BEEN IN AND SEEN IN THERAPIES  DOING BETTER NOW THEN SHE WAS AT HOME PRIOR TO ADMISSION. EDUCATED ON DRESSING CHANGES AND COLOSTOMY CARE.  Patient/Family verbalized understanding of follow-up arrangements:  Yes  Individual responsible for coordination of the follow-up plan: SELF AND ERROL-HUSBAND [phone]  Confirmed correct DME delivered:  Elease Hashimoto 05/14/2021    Rani Sisney, Gardiner Rhyme

## 2021-05-15 LAB — BASIC METABOLIC PANEL
Anion gap: 12 (ref 5–15)
BUN: 9 mg/dL (ref 6–20)
CO2: 28 mmol/L (ref 22–32)
Calcium: 8.9 mg/dL (ref 8.9–10.3)
Chloride: 99 mmol/L (ref 98–111)
Creatinine, Ser: 0.57 mg/dL (ref 0.44–1.00)
GFR, Estimated: >60 mL/min (ref >60)
Glucose, Bld: 91 mg/dL (ref 70–99)
Potassium: 3.7 mmol/L (ref 3.5–5.1)
Sodium: 139 mmol/L (ref 135–145)

## 2021-05-15 NOTE — Progress Notes (Signed)
PROGRESS NOTE   Subjective/Complaints:  Pt reports comfortable with wound care and colostomy care- has received kit at home with supplies for colostomy and has # in it to call for refills.   Asking if needs to resume other BP meds- explained her BP too low to restart meds- take BP's at home and d/w PCP at next visit.   We also discussed, usually Oncology will prescribe pain meds, however if there's a problem, I can do for now- will get 7 days at d/c, so call me in clinic if needs refill.    ROS:   Pt denies SOB, abd pain, CP, N/V/C/D, and vision changes     Objective:   No results found. No results for input(s): WBC, HGB, HCT, PLT in the last 72 hours.  Recent Labs    05/14/21 0513 05/15/21 0521  NA 135 139  K 3.1* 3.7  CL 96* 99  CO2 30 28  GLUCOSE 94 91  BUN 9 9  CREATININE 0.73 0.57  CALCIUM 8.9 8.9    Intake/Output Summary (Last 24 hours) at 05/15/2021 0824 Last data filed at 05/14/2021 1300 Gross per 24 hour  Intake 440 ml  Output 300 ml  Net 140 ml        Physical Exam: Vital Signs Blood pressure 133/88, pulse 100, temperature 98.6 F (37 C), resp. rate 15, height _0  (1.6 m), weight 93.2 kg, SpO2 100 %.       General: awake, alert, appropriate, sitting up in bed; talking to mother on phone; NAD HENT: conjugate gaze; oropharynx moist CV: regular rate; no JVD Pulmonary: CTA B/L; no W/R/R- good air movement GI: soft, NT, ND, (+)BS- (+) colostomy; wound C/D/I- dressed Psychiatric: appropriate- brighter affect Neurological: Ox3  Motor: Bilateral lower extremities: Grossly 4 -/5 throughout, unchanged  Assessment/Plan: 1. Functional deficits which require 3+ hours per day of interdisciplinary therapy in a comprehensive inpatient rehab setting. Physiatrist is providing close team supervision and 24 hour management of active medical problems listed below. Physiatrist and rehab team continue  to assess barriers to discharge/monitor patient progress toward functional and medical goals  Care Tool:  Bathing    Body parts bathed by patient: Right arm, Left arm, Chest, Abdomen, Front perineal area, Buttocks, Right upper leg, Left upper leg, Face, Right lower leg, Left lower leg   Body parts bathed by helper: Right lower leg, Left lower leg     Bathing assist Assist Level: Set up assist     Upper Body Dressing/Undressing Upper body dressing   What is the patient wearing?: Pull over shirt    Upper body assist Assist Level: Set up assist    Lower Body Dressing/Undressing Lower body dressing      What is the patient wearing?: Pants     Lower body assist Assist for lower body dressing: Supervision/Verbal cueing     Toileting Toileting    Toileting assist Assist for toileting: Supervision/Verbal cueing     Transfers Chair/bed transfer  Transfers assist     Chair/bed transfer assist level: Independent with assistive device Chair/bed transfer assistive device: Programmer, multimedia   Ambulation assist   Ambulation activity did not  occur: Safety/medical concerns (requires use of RW, which pt did not use at baseline and pt reports feeling unsafe to attempt gait without RW today)  Assist level: Independent with assistive device Assistive device: Walker-rolling Max distance: 300+   Walk 10 feet activity   Assist  Walk 10 feet activity did not occur: Safety/medical concerns  Assist level: Independent with assistive device Assistive device: Walker-rolling   Walk 50 feet activity   Assist Walk 50 feet with 2 turns activity did not occur: Safety/medical concerns  Assist level: Independent with assistive device Assistive device: Walker-rolling    Walk 150 feet activity   Assist Walk 150 feet activity did not occur: Safety/medical concerns  Assist level: Independent with assistive device Assistive device: Walker-rolling    Walk 10 feet  on uneven surface  activity   Assist Walk 10 feet on uneven surfaces activity did not occur: Safety/medical concerns   Assist level: Supervision/Verbal cueing Assistive device: Walker-rolling   Wheelchair     Assist Is the patient using a wheelchair?: No Type of Wheelchair: Manual    Wheelchair assist level: Dependent - Patient 0%      Wheelchair 50 feet with 2 turns activity    Assist        Assist Level: Dependent - Patient 0%   Wheelchair 150 feet activity     Assist      Assist Level: Dependent - Patient 0%   Blood pressure 133/88, pulse 100, temperature 98.6 F (37 C), resp. rate 15, height _0  (1.6 m), weight 93.2 kg, SpO2 100 %.    Medical Problem List and Plan: 1.  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  Status post laparoscopic-assisted Hartmann left colectomy/end colostomy, small bowel resection, left retroperitoneal/flank abscess drainage 04/15/2021.  JP drain removed 04/28/2021.  Colostomy care as directed/wound [facility] Monday Wednesday Friday.  Plan outpatient follow-up medical oncology for systemic chemotherapy  D/c 05/15/21 12/8 - con't CIR_ PT and OT_ d/c tomorrow- VAC off- knows how to change colostomy- will arrange for shower today if possible.  12/9- d/c today- will need f/u with me- f/u with NP Zella Ball prior to seeing me if cannot get in to clinic in the next 6 weeks.  2. Impaired mobility: vascular ultrasound reviewed and negative for clot -DVT/anticoagulation:  Mechanical: Antiembolism stockings, thigh (TED hose) Bilateral lower extremities.   Started Lovenox since Hb stable.  12/6- Hb 10.4- stable 12/7- pt walking 150 ft- will not send home on Lovenox, but continue until d/c.              -antiplatelet therapy: N/A 3. Pain: continue Voltaren gel 4 times daily, oxycodone/Robaxin as needed  12/7- change Robaxin to 1000 mg q6 hours scheduled- since so helpful.  12/8- robaxin scheduled helped- showed pt how to use  pillow to push on abd with sitting and sit to stand.   12/9- can give 7 days of pain meds- if possible, can get pain meds from Oncology, but if cannot, I will refill pain meds. Let pt know.   4. Mood: Ativan as needed  12/2- con't Ativan prn- not taking a lot.              -antipsychotic agents: N/A 5. Neuropsych: This patient is capable of making decisions on her own behalf. 6. Skin/Wound Care: Routine skin checks 7. Fluids/Electrolytes/Nutrition: Routine in and outs with follow-up chemistries 8.  Acute blood loss anemia.  Last transfusion 11/19.    Hemoglobin 9.9 on 12/1, trending down, labs ordered  for tomorrow  12/6- Hb up to 10.4- con't to monitor 9.  Septic shock.  Weaned from pressors 10.  Acute respiratory failure.  Extubated 11/10.  Monitor oxygen saturations. 11.  History of inappropriate sinus tachycardia.  Followed by cardiology service Dr. Gardiner Rhyme.  Continue Lopressor 25 mg twice daily Mildly elevated on 12/3 12.  Anasarca with moderate protein malnutrtion.  Related to hypoalbuminemia and aggressive volume repletion.  Continue Lasix.  12/1- Alb up to 2.6- will con't to monitor- low was 2.3.  12/6- Alb up to 2.6-   12/7- will decrease Lasix to 20 mg daily- since swelling is better- will recheck labs in AM for borderline low K+ of 3.5  13.  Obesity.  BMI 39.99.  Dietary follow-up 14.  Leukocytosis.  Resolved completed Cipro and Flagyl.  Follow-up per general surgery 15. Hypokalemia:   Since on Lasix, 20 mEq daily as of tomorrow.   Potassium 4.0 on 12/1, labs ordered for tomorrow  12/8- K+ 3.1- already decreased Lasix to 20 mg daily as of this AM- will replete KCL 40 mEq x2 and recheck in AM.   12/9- K+ 3.7 today after repletion- will need to see PCP to recheck labs.  16.  Sinus tachycardia- per pt chronic issue Vitals:   05/14/21 2039 05/15/21 0620  BP: 127/89 133/88  Pulse: (!) 107 100  Resp: 16 15  Temp: 98.3 F (36.8 C) 98.6 F (37 C)  SpO2: 100% 100%     ECG from  04/2021 reviewed showing sinus tachycardia with prolonged QTC, repeat ECG ordered  Metoprolol increased to 48m BID on 12/5  12/6- HR 98-110- just got increase of metoprolol so will let this dose continue for now.   12/7- HR 99-107 in last 24 hours- will hold on increasing Metoprolol since BP 1628systolic this AM.   136/6 don't restart BP meds- check BP at home and f/u with PCP about possibly restarting in future.   LOS: 14 days A FACE TO FACE EVALUATION WAS PERFORMED  Clarece Drzewiecki 05/15/2021, 8:24 AM

## 2021-05-15 NOTE — Progress Notes (Signed)
Patient ID: Mikayla Rios, female   DOB: Dec 04, 1985, 35 y.o.   MRN: [id number] Have asked [facility]-bedside RN to give pt wound supplies due to [facility] will not visit until Monday. She will do this. Pt feels ready for discharge today.

## 2021-05-15 NOTE — Progress Notes (Signed)
Pt discharged off unit with significant other, and all personal belongings. Education done by [employer]. Pt demonstrated wet to dry dressing changes for right medial abdominal wound, and left lower abdominal wound. Pt also emptied colostomy bag. Pt comfortable with dressing changes. No complications noted.     Dayna Ramus

## 2021-05-17 DIAGNOSIS — Z483 Aftercare following surgery for neoplasm: Secondary | ICD-10-CM | POA: Diagnosis not present

## 2021-05-17 DIAGNOSIS — Z933 Colostomy status: Secondary | ICD-10-CM | POA: Diagnosis not present

## 2021-05-17 DIAGNOSIS — Z9049 Acquired absence of other specified parts of digestive tract: Secondary | ICD-10-CM | POA: Diagnosis not present

## 2021-05-17 DIAGNOSIS — Z79891 Long term (current) use of opiate analgesic: Secondary | ICD-10-CM | POA: Diagnosis not present

## 2021-05-17 DIAGNOSIS — E669 Obesity, unspecified: Secondary | ICD-10-CM | POA: Diagnosis not present

## 2021-05-17 DIAGNOSIS — Z4801 Encounter for change or removal of surgical wound dressing: Secondary | ICD-10-CM | POA: Diagnosis not present

## 2021-05-17 DIAGNOSIS — K5792 Diverticulitis of intestine, part unspecified, without perforation or abscess without bleeding: Secondary | ICD-10-CM | POA: Diagnosis not present

## 2021-05-17 DIAGNOSIS — Z6837 Body mass index (BMI) 37.0-37.9, adult: Secondary | ICD-10-CM | POA: Diagnosis not present

## 2021-05-17 DIAGNOSIS — C189 Malignant neoplasm of colon, unspecified: Secondary | ICD-10-CM | POA: Diagnosis not present

## 2021-05-17 DIAGNOSIS — E876 Hypokalemia: Secondary | ICD-10-CM | POA: Diagnosis not present

## 2021-05-17 DIAGNOSIS — I10 Essential (primary) hypertension: Secondary | ICD-10-CM | POA: Diagnosis not present

## 2021-05-17 DIAGNOSIS — G4730 Sleep apnea, unspecified: Secondary | ICD-10-CM | POA: Diagnosis not present

## 2021-05-17 DIAGNOSIS — D62 Acute posthemorrhagic anemia: Secondary | ICD-10-CM | POA: Diagnosis not present

## 2021-05-17 DIAGNOSIS — E440 Moderate protein-calorie malnutrition: Secondary | ICD-10-CM | POA: Diagnosis not present

## 2021-05-21 ENCOUNTER — Encounter: Payer: Self-pay | Admitting: Hematology

## 2021-05-21 DIAGNOSIS — I10 Essential (primary) hypertension: Secondary | ICD-10-CM | POA: Diagnosis not present

## 2021-05-21 DIAGNOSIS — Z483 Aftercare following surgery for neoplasm: Secondary | ICD-10-CM | POA: Diagnosis not present

## 2021-05-21 DIAGNOSIS — C189 Malignant neoplasm of colon, unspecified: Secondary | ICD-10-CM | POA: Diagnosis not present

## 2021-05-21 DIAGNOSIS — E669 Obesity, unspecified: Secondary | ICD-10-CM | POA: Diagnosis not present

## 2021-05-21 DIAGNOSIS — Z9049 Acquired absence of other specified parts of digestive tract: Secondary | ICD-10-CM | POA: Diagnosis not present

## 2021-05-21 DIAGNOSIS — D62 Acute posthemorrhagic anemia: Secondary | ICD-10-CM | POA: Diagnosis not present

## 2021-05-21 DIAGNOSIS — G4730 Sleep apnea, unspecified: Secondary | ICD-10-CM | POA: Diagnosis not present

## 2021-05-21 DIAGNOSIS — Z4801 Encounter for change or removal of surgical wound dressing: Secondary | ICD-10-CM | POA: Diagnosis not present

## 2021-05-21 DIAGNOSIS — K5792 Diverticulitis of intestine, part unspecified, without perforation or abscess without bleeding: Secondary | ICD-10-CM | POA: Diagnosis not present

## 2021-05-21 DIAGNOSIS — E440 Moderate protein-calorie malnutrition: Secondary | ICD-10-CM | POA: Diagnosis not present

## 2021-05-21 DIAGNOSIS — E876 Hypokalemia: Secondary | ICD-10-CM | POA: Diagnosis not present

## 2021-05-21 DIAGNOSIS — Z933 Colostomy status: Secondary | ICD-10-CM | POA: Diagnosis not present

## 2021-05-21 DIAGNOSIS — Z79891 Long term (current) use of opiate analgesic: Secondary | ICD-10-CM | POA: Diagnosis not present

## 2021-05-21 DIAGNOSIS — Z6837 Body mass index (BMI) 37.0-37.9, adult: Secondary | ICD-10-CM | POA: Diagnosis not present

## 2021-05-22 ENCOUNTER — Other Ambulatory Visit: Payer: Self-pay

## 2021-05-22 MED ORDER — OXYCODONE-ACETAMINOPHEN 5-325 MG PO TABS: 1.0000 | ORAL_TABLET | Freq: Three times a day (TID) | ORAL | 0 refills | Status: DC | PRN

## 2021-05-25 ENCOUNTER — Other Ambulatory Visit: Payer: Self-pay

## 2021-05-25 ENCOUNTER — Inpatient Hospital Stay: Payer: BC Managed Care – PPO | Attending: Hematology | Admitting: Hematology

## 2021-05-25 ENCOUNTER — Encounter: Payer: Self-pay | Admitting: Hematology

## 2021-05-25 VITALS — BP 127/97 | HR 124 | Temp 99.1°F | Resp 18 | Ht 63.0 in | Wt 193.3 lb

## 2021-05-25 DIAGNOSIS — F419 Anxiety disorder, unspecified: Secondary | ICD-10-CM | POA: Diagnosis not present

## 2021-05-25 DIAGNOSIS — Z79899 Other long term (current) drug therapy: Secondary | ICD-10-CM | POA: Diagnosis not present

## 2021-05-25 DIAGNOSIS — F32A Depression, unspecified: Secondary | ICD-10-CM | POA: Diagnosis not present

## 2021-05-25 DIAGNOSIS — Z933 Colostomy status: Secondary | ICD-10-CM | POA: Diagnosis not present

## 2021-05-25 DIAGNOSIS — D649 Anemia, unspecified: Secondary | ICD-10-CM | POA: Diagnosis not present

## 2021-05-25 DIAGNOSIS — C187 Malignant neoplasm of sigmoid colon: Secondary | ICD-10-CM | POA: Insufficient documentation

## 2021-05-25 DIAGNOSIS — K5720 Diverticulitis of large intestine with perforation and abscess without bleeding: Secondary | ICD-10-CM | POA: Diagnosis not present

## 2021-05-25 MED ORDER — ONDANSETRON HCL 8 MG PO TABS: 8.0000 mg | ORAL_TABLET | Freq: Two times a day (BID) | ORAL | 1 refills | Status: DC | PRN

## 2021-05-25 MED ORDER — DULOXETINE HCL 30 MG PO CPEP: 30.0000 mg | ORAL_CAPSULE | Freq: Every day | ORAL | 0 refills | Status: DC

## 2021-05-25 MED ORDER — PROCHLORPERAZINE MALEATE 10 MG PO TABS: 10.0000 mg | ORAL_TABLET | Freq: Four times a day (QID) | ORAL | 1 refills | Status: DC | PRN

## 2021-05-25 MED ORDER — OXYCODONE HCL 5 MG PO TABS: 5.0000 mg | ORAL_TABLET | Freq: Once | ORAL | Status: DC

## 2021-05-25 NOTE — Progress Notes (Addendum)
[place]   Telephone:([phone]) [phone] Fax:([phone]) [phone]   Clinic Follow up Note   Patient Care Team: [name], MD as PCP - General (Internal Medicine) [name], MD as PCP - Cardiology (Cardiology) [name], [name] as Physician Assistant (Emergency Medicine) Leighton Ruff, MD as Consulting Physician (General Surgery)  Date of Service:  05/25/2021  CHIEF COMPLAINT: f/u of sigmoid colon cancer  CURRENT THERAPY:  PENDING Adjuvant chemo  ASSESSMENT:  Mikayla Rios is a 35 y.o. female with   1. 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 She was found to have diverticulitis with perforation. She also developed an abscess requiring 2 drains. She continued to have abdominal pain and drainage, undergoing drain exchange three times. -she developed worsening weakness and was admitted on 04/10/21. She was taken for emergent small bowel and left colon resection on 04/15/21 under Dr. Johney Maine. Pathology from the procedure revealed moderately differentiated colonic adenocarcinoma extending into pericolonic adipose tissue and small bowel. Margins negative for invasive carcinoma, but low-grade dysplasia involves proximal margin. All 5 lymph nodes negative (0/5). MSI low. -CT scans were negative for distant metastasis  --I reviewed the work up thus far with them today. Given T4b tumor and the perforation, her risk of recurrence especially peritoneal metastasis, is very high.  I recommend adjuvant chemotherapy to prevent recurrence. I discussed the option of FOLFOX and CAPOX with her in detail. Given her overall low PS, I recommend FOLFOX for 6 months.  -Chemotherapy consent: Side effects including but does not not limited to, fatigue, nausea, vomiting, diarrhea, hair loss, neuropathy, fluid retention, renal and kidney dysfunction, neutropenic fever, needed for blood  transfusion, bleeding, were discussed with patient in great detail. She agrees to proceed. -the goal of chemo is curative  -I discussed the clinical trial ATOMIC with her. She is interested in more information and will meet with our research nurse. -I discussed port with them as well. She expressed concern about going under anesthesia because of her prior reaction, she is agreeable with PICC.  -Due to her remaining open wound, I will give her additional 2 to 3 weeks to recover. Plan to start chemo on the week of 1/9 if her wound heals well   2. Symptom Management: Abdominal pain, Weight loss -she continues to have severe pain at the incision site.  -She was previously on oxycodone 10 mg every 4 hours.  I discussed narcotics dependence, and suggested her to gradually wean off.  I have decreased her oxycodone to perccet 5/325 to every 6 hours as needed. I prescribed Cymbalta today and reviewed potential side effects with her.  I think Cymbalta would also help her anxiety and depression. -we will refer her to our dietician.  3.  Anxiety and depression -Due to the prolonged illness and multiple hospital stay since May 2022, and cancer diagnosis, she has been feeling depressed lately. -We will start her on Cymbalta. -We will refer her to social worker for counseling. -I will also refer her to psychologist Dr. Michail Sermon  4. 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 I discussed this result with them. 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 However her MSI was low, instead of high, which is unusual for Lynch syndrome. -We will refer her to genetics due to her young age and family history of colon cancer. She is agreeable, and I will refer  her.  4. Social Support -We will connect her with our Education officer, museum, palliative care, and counselor. -she is working to apply for disability. Her job ended just prior to the start of her  symptoms.   PLAN: -meet with research study nurse today -referrals to social work, palliative care, dietician, genetics -I called in cymbalta and refilled her percocet. -lab and chemo class later this week or next week -f/u with me the week of 06/10/21 -picc line placement, lab, and FOLFOX on 06/15/21 -they prefer late afternoon appointments (after 2-2:30 pm), so her husband can accompany her.   No problem-specific Assessment & Plan notes found for this encounter.   SUMMARY OF ONCOLOGIC HISTORY: Oncology History  Malignant neoplasm of sigmoid colon [facility])   Initial Diagnosis   Malignant neoplasm of sigmoid colon ([place])   11/04/2020 Imaging   EXAM: CT ABDOMEN AND PELVIS WITHOUT CONTRAST  IMPRESSION: 1. Perforating descending colonic diverticulitis with multiple abdominopelvic gas and fluid collections, measuring up to 5.7 cm and further described above. 2. Cholelithiasis without findings of acute cholecystitis. 3. 3.6 cm benign left adrenal adenoma. 4. Leiomyomatous uterus. 5. 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   11/07/2020 Imaging   EXAM: CT ABDOMEN AND PELVIS WITHOUT CONTRAST  IMPRESSION: Continued wall thickening is seen involving descending colon suggesting infectious or inflammatory colitis or perforated diverticulitis. There is an adjacent fluid collection measuring 7.0 x 5.0 cm consistent with abscess which is significantly enlarged compared to prior exam. Adjacent to the abscess, there is a severely thickened small bowel loop most consistent with secondary inflammation.   5.2 x 3.5 cm fluid collection is noted within the left psoas muscle consistent with abscess which is significantly enlarged compared to prior exam. 4.4 x 3.8 cm fluid collection consistent with abscess is noted in the left retroperitoneal region which is also enlarged compared to prior exam.    Fibroid uterus.   Cholelithiasis.   11/27/2020 Imaging   EXAM: CT ABDOMEN AND PELVIS WITHOUT CONTRAST  IMPRESSION: 1. Significantly interval decreased size of the previously visualized left retroperitoneal and left anterior mid abdominal fluid collections. There is persistent fat stranding and mural thickening of the mid descending colon at this level. 2. Unchanged leiomyomatous uterus. 3. Unchanged cholelithiasis.   12/11/2020 Imaging   EXAM: CT ABDOMEN AND PELVIS WITHOUT CONTRAST  IMPRESSION: 1. Stable inflammatory changes centered around the distal descending colon in the left lower abdomen. Pericolonic inflammatory changes have minimally changed since 11/27/2020. Small pocket of gas medial to the colon is probably associated with a fistula or small residual abscess collection. 2. Stable position of the two percutaneous drains. The more anterior drain may be extending through a portion of the small bowel. 3. Cholelithiasis. 4. Fibroid uterus. Cannot exclude an ovarian/adnexal cystic structure near the uterine fundus. 5. Left adrenal adenoma.   01/07/2021 Imaging   EXAM: CT ABDOMEN AND PELVIS WITH CONTRAST  IMPRESSION: 1. No new abscesses identified. Similar degree of soft tissue thickening seen in the left pericolonic region. The degree of persistent soft tissues thickening in the pericolonic space further raises suspicions for malignancy. Further evaluation with colonoscopy should be performed. 2. Left retroperitoneal abscess drain unchanged in position. Anterior left abdominal drain again seen terminating within small bowel loop. 3. 2.6 cm mildly sclerotic lesion noted in the L2 vertebral body. Further evaluation with contrast enhanced lumbar spine MRI should be performed.   02/04/2021 Imaging   EXAM:  CT ABDOMEN AND PELVIS WITH CONTRAST  IMPRESSION: No new abdominopelvic collections or abscess development in the 1 month interval.   Left anterior drain remains  within a loop of small bowel, unchanged.   Left lateral abscess drain remains in the retroperitoneal space adjacent to the iliopsoas muscle with a small amount of residual fluid and air but no measurable collection.   Stable soft tissue prominence and pericolonic strandy edema/inflammation about the left descending colon compatible with residual diverticulitis/colitis. Difficult to exclude underlying transmural lesion.   04/01/2021 Imaging   EXAM: CT ABDOMEN AND PELVIS WITH CONTRAST  IMPRESSION: There appears to be significant enhancement and wall thickening involving the descending colon with some degree of traction and involvement of adjacent small bowel loops. This is consistent with the history of diverticulitis and perforation. Stable position of percutaneous drainage catheter is seen adjacent to left psoas muscle with no significant residual fluid remaining. The other percutaneous drainage catheter that was previously noted to be within small bowel loop on prior exam in the left lower quadrant, has significantly retracted and appears to be outside of the peritoneal space at this time.   Since the prior exam, there does appear to be some degree of rotation involving mesenteric vessels and structures in the right lower quadrant, suggesting partial volvulus or malrotation. There is the interval development of several lymph nodes in this area, most likely inflammatory or reactive in etiology. Mild amount of free fluid is also noted in the pelvis. However, no significant bowel wall thickening or dilatation is seen in this area. These results will be called to the ordering clinician or representative by the Radiologist Assistant, and communication documented in the PACS or zVision Dashboard.   Stable uterine fibroid.   Hepatic steatosis.   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   Cholelithiasis.   04/10/2021 Imaging   EXAM: CT ANGIOGRAPHY CHEST CT ABDOMEN AND PELVIS WITH  CONTRAST  IMPRESSION: 1. Again seen are findings compatible with descending colon diverticulitis with perforation. Free air and inflammation have increased in the interval. 2. New lobulated enhancing fluid collection posterior to this segment of inflamed colon measuring 8.5 x 3.5 x 10.0 cm. This collection now invades the adjacent iliopsoas muscle as well as extends through the left lateral abdominal wall. The tip of the drainage catheter is in this collection. Findings are compatible with abscess. 3. Anterior left percutaneous drainage catheter tip has been pulled back and is now within the subcutaneous tissues. 4. Trace free fluid. 5. No pulmonary embolism.  No acute cardiopulmonary process. 6. Cholelithiasis. 7. Fatty infiltration of the liver.   04/15/2021 [facility]   FINAL MICROSCOPIC DIAGNOSIS:   A. SMALL BOWEL, RESECTION:  - Adenocarcinoma.  - No carcinoma identified in 1 lymph node.   B. PERFORATED LEFT COLON, RESECTION:  - Moderately differentiated colonic adenocarcinoma.  - Tumor extends into pericolonic adipose tissue, and is strongly  suggestive of invasion into small bowel.  See oncology table/comments.  - No carcinoma identified in 4 lymph nodes (0/4).  - Tubular adenoma with high-grade dysplasia, 1.  - Tubular adenomas with low grade dysplasia, 3.   Comments: The size of the tumor is difficult to estimate secondary to the disrupted nature of the specimen, as well as the infiltrative nature of the tumor.  Tumor can be identified as definitely invading into the pericolonic adipose tissue (block B4), but given the extreme disruption of the tissue, and the involvement of the small bowel by what appears to be a colonic  adenocarcinoma, I favor perforation of the large bowel with direct invasion into the small bowel.  Accordingly, I believe this is best regarded as a pT4b lesion.   ADDENDUM:  Mismatch Repair Protein ([facility])  SUMMARY INTERPRETATION: ABNORMAL  There  is loss of the major MMR protein [name]6: This indicates a high probability that a hereditary germline mutation is present and referral to genetic counseling is warranted. It is recommended that the loss of protein expression be correlated with molecular based microsatellite instability testing.   [facility] EXPRESSION RESULTS  TEST           RESULT  MLH1:          Preserved nuclear expression  [name]2:          Preserved nuclear expression  [name]6:          LOSS OF NUCLEAR EXPRESSION  PMS2:          Preserved nuclear expression    04/29/2021 Imaging   EXAM: CT ABDOMEN AND PELVIS WITH CONTRAST  IMPRESSION: Continued left retroperitoneal abscesses inferior to the left kidney, involving the left psoas muscle and left abdominal wall musculature. Overall size is decreased since prior study. Interval removal of left lower quadrant abscess drainage catheter.   New fluid collection in the cul-de-sac and wrapping around the uterus concerning for abscess.   Cholelithiasis.   Small left pleural effusion.  Bibasilar atelectasis.   Stable left adrenal adenoma.  ADDENDUM: After discussing the case with Dr. Dwaine Gale in interventional radiology, it was noted that the fluid in the pelvis originally thought to be in the cul-de-sac is likely within the vagina, best seen on sagittal imaging. Recommend speculum exam.   Also, in the left lateral wall in the area of prior abscess in the lateral wall abdominal musculature, some of the abdomen soft tissue appears to be enhancing. While this could be infectious, cannot exclude tumor seeding in the left lateral abdominal wall, measuring 6.9 x 3.8 cm on image 64 of series 2.      INTERVAL HISTORY:  Mikayla Rios is here for a follow up of colon cancer. She was last seen by me on 04/24/21 while she was in the hospital. She presents to the clinic accompanied by her husband. She reports being miserable-- she is in pain and is unable to get comfortable. When I asked about  her mood, she reports she cries a lot and became tearful. When asked about her weight, she reports her initial weight was around 330-340 lbs in 2021, then she and her husband went on a diet. She dropped to 290-300 lbs by 10/2020 with diet, but she then lost significant weight to 250 lbs in 12/2020 and now 193 today.   All other systems were reviewed with the patient and are negative.  MEDICAL HISTORY:  Past Medical History:  Diagnosis Date   Allergy    seasonal   Anemia    Blood infection 1985   Blood transfusion without reported diagnosis    Diverticulitis    Hypertension    Obesity    Sleep apnea     SURGICAL HISTORY: Past Surgical History:  Procedure Laterality Date   COLECTOMY WITH COLOSTOMY CREATION/HARTMANN PROCEDURE N/A 04/15/2021   Procedure: COLOSTOMY CREATION/HARTMANN PROCEDURE;  Surgeon: [name], MD;  Location: [facility];  Service: General;  Laterality: N/A;   IR CATHETER TUBE CHANGE  12/12/2020   IR CATHETER TUBE CHANGE  01/27/2021   IR CATHETER TUBE CHANGE  02/19/2021   IR CATHETER TUBE  CHANGE  04/13/2021   IR RADIOLOGIST EVAL & MGMT  11/27/2020   IR RADIOLOGIST EVAL & MGMT  12/11/2020   IR RADIOLOGIST EVAL & MGMT  01/07/2021   IR RADIOLOGIST EVAL & MGMT  02/04/2021   IR SINUS/FIST TUBE CHK-NON GI  12/12/2020   IR SINUS/FIST TUBE CHK-NON GI  02/19/2021   LAPAROSCOPIC PARTIAL COLECTOMY N/A 04/15/2021   Procedure: LAPAROSCOPIC ASSISTED HARTMANN RESECTION;  Surgeon: [name], MD;  Location: [facility];  Service: General;  Laterality: N/A;    I have reviewed the social history and family history with the patient and they are unchanged from previous note.  ALLERGIES:  is allergic to chlorhexidine gluconate, shellfish allergy, and penicillins.  MEDICATIONS:  Current Outpatient Medications  Medication Sig Dispense Refill   DULoxetine (CYMBALTA) 30 MG capsule Take 1 capsule (30 mg total) by mouth daily. 30 capsule 0   oxyCODONE-acetaminophen (PERCOCET/ROXICET) 5-325  MG tablet Take 1 tablet by mouth every 8 (eight) hours as needed for severe pain. 20 tablet 0   acetaminophen (TYLENOL) 325 MG tablet Take 1-2 tablets (325-650 mg total) by mouth every 4 (four) hours as needed for mild pain.     diclofenac Sodium (VOLTAREN) 1 % GEL Apply 4 g topically 4 (four) times daily as needed (for mild pain). 4 g 0   furosemide (LASIX) 20 MG tablet Take 1 tablet (20 mg total) by mouth daily. 30 tablet 0   methocarbamol 1000 MG TABS Take 1,000 mg by mouth 4 (four) times daily. 120 tablet 0   metoprolol tartrate (LOPRESSOR) 50 MG tablet Take 1 tablet (50 mg total) by mouth 2 (two) times daily. 60 tablet 0   pantoprazole (PROTONIX) 40 MG tablet Take 1 tablet (40 mg total) by mouth daily. 30 tablet 0   Current Facility-Administered Medications  Medication Dose Route Frequency Provider Last Rate Last Admin   oxyCODONE (Oxy IR/ROXICODONE) immediate release tablet 5 mg  5 mg Oral Once [name], MD        PHYSICAL EXAMINATION: ECOG PERFORMANCE STATUS: 3 - Symptomatic, >50% confined to bed  Vitals:   05/25/21 1520  BP: (!) 127/97  Pulse: (!) 124  Resp: 18  Temp: 99.1 F (37.3 C)  SpO2: 100%   Wt Readings from Last 3 Encounters:  05/25/21 193 lb 4.8 oz (87.7 kg)  05/08/21 205 lb 7.5 oz (93.2 kg)  04/24/21 225 lb 12 oz (102.4 kg)     GENERAL:alert, no distress and comfortable SKIN: skin color normal, no rashes or significant lesions EYES: normal, Conjunctiva are pink and non-injected, sclera clear  NEURO: alert & oriented x 3 with fluent speech  LABORATORY DATA:  I have reviewed the data as listed CBC Latest Ref Rng & Units 05/11/2021 05/07/2021 05/04/2021  WBC 4.0 - 10.5 K/uL 8.2 9.5 9.5  Hemoglobin 12.0 - 15.0 g/dL 10.4(L) 9.9(L) 10.0(L)  Hematocrit 36.0 - 46.0 % 32.3(L) 30.3(L) 32.2(L)  Platelets 150 - 400 K/uL 557(H) 597(H) 701(H)     CMP Latest Ref Rng & Units 05/15/2021 05/14/2021 05/11/2021  Glucose 70 - 99 mg/dL 91 94 93  BUN 6 - 20 mg/dL _0 Creatinine 0.44 - 1.00 mg/dL 0.57 0.73 0.62  Sodium 135 - 145 mmol/L 139 135 137  Potassium 3.5 - 5.1 mmol/L 3.7 3.1(L) 3.5  Chloride 98 - 111 mmol/L 99 96(L) 97(L)  CO2 22 - 32 mmol/L _1 Calcium 8.9 - 10.3 mg/dL 8.9 8.9 8.9  Total Protein 6.5 - 8.1 g/dL -  6.6 6.5  Total Bilirubin 0.3 - 1.2 mg/dL - 0.6 0.5  Alkaline Phos 38 - 126 U/L - 35(L) 35(L)  AST 15 - 41 U/L - 16 14(L)  ALT 0 - 44 U/L - 11 9      RADIOGRAPHIC STUDIES: I have personally reviewed the radiological images as listed and agreed with the findings in the report. No results found.    Orders Placed This Encounter  Procedures   Amb Referral to Palliative Care    Referral Priority:   Routine    Referral Type:   Consultation    Referral Reason:   Symptom Managment    Number of Visits Requested:   1   Ambulatory Referral to [facility]    Referral Priority:   Routine    Referral Type:   Consultation    Referral Reason:   Specialty Services Required    Number of Visits Requested:   1   Ambulatory referral to Social Work    Referral Priority:   Routine    Referral Type:   Consultation    Referral Reason:   Specialty Services Required    Number of Visits Requested:   1   Ambulatory referral to Genetics    Referral Priority:   Routine    Referral Type:   Consultation    Referral Reason:   Specialty Services Required    Number of Visits Requested:   1   All questions were answered. The patient knows to call the clinic with any problems, questions or concerns. No barriers to learning was detected. The total time spent in the appointment was 40 minutes.     [name], MD 05/25/2021   I, Wilburn Mylar, am acting as scribe for [name], MD.   I have reviewed the above documentation for accuracy and completeness, and I agree with the above.

## 2021-05-25 NOTE — Progress Notes (Signed)
START ON PATHWAY REGIMEN - Colorectal     A cycle is every 14 days:     Oxaliplatin      Leucovorin      Fluorouracil      Fluorouracil   **Always confirm dose/schedule in your pharmacy ordering system**  Patient Characteristics: Postoperative without Neoadjuvant Therapy (Pathologic Staging), Colon, Stage IIB/C Tumor Location: Colon Therapeutic Status: Postoperative without Neoadjuvant Therapy (Pathologic Staging) AJCC M Category: cM0 AJCC T Category: pT4b AJCC N Category: pN0 AJCC 8 Stage Grouping: IIC Intent of Therapy: Curative Intent, Discussed with Patient

## 2021-05-25 NOTE — Progress Notes (Signed)
I met with Ms Hunger and her husband before her consultation with Dr Burr Medico.  I explained my role as a nurse navigator and provided my contact information. I explained the services provided at [facility] and provided written information.  I explained the alight grant and let  them know one of the financial advisors will reach out to  her at the time of her chemo education class. I briefly explained insertion and care of a port a cath.  I showed a sample of the port a cath. I told her that she will be scheduled for chemotherapy education class prior to receiving chemotherapy.  I told them she will need adequate wound health before starting chemotherapy ad Dr Burr Medico will address that today.  All questions were answered.  They verbalized understanding.

## 2021-05-25 NOTE — Addendum Note (Signed)
Addended by: [name] on: 05/25/2021 09:47 PM   Modules accepted: Orders

## 2021-05-26 ENCOUNTER — Encounter: Payer: Self-pay | Admitting: General Practice

## 2021-05-26 ENCOUNTER — Encounter: Payer: Self-pay | Admitting: Hematology

## 2021-05-26 DIAGNOSIS — I10 Essential (primary) hypertension: Secondary | ICD-10-CM | POA: Diagnosis not present

## 2021-05-26 DIAGNOSIS — Z6837 Body mass index (BMI) 37.0-37.9, adult: Secondary | ICD-10-CM | POA: Diagnosis not present

## 2021-05-26 DIAGNOSIS — G4730 Sleep apnea, unspecified: Secondary | ICD-10-CM | POA: Diagnosis not present

## 2021-05-26 DIAGNOSIS — C189 Malignant neoplasm of colon, unspecified: Secondary | ICD-10-CM | POA: Diagnosis not present

## 2021-05-26 DIAGNOSIS — Z933 Colostomy status: Secondary | ICD-10-CM | POA: Diagnosis not present

## 2021-05-26 DIAGNOSIS — E876 Hypokalemia: Secondary | ICD-10-CM | POA: Diagnosis not present

## 2021-05-26 DIAGNOSIS — Z4801 Encounter for change or removal of surgical wound dressing: Secondary | ICD-10-CM | POA: Diagnosis not present

## 2021-05-26 DIAGNOSIS — Z79891 Long term (current) use of opiate analgesic: Secondary | ICD-10-CM | POA: Diagnosis not present

## 2021-05-26 DIAGNOSIS — E440 Moderate protein-calorie malnutrition: Secondary | ICD-10-CM | POA: Diagnosis not present

## 2021-05-26 DIAGNOSIS — E669 Obesity, unspecified: Secondary | ICD-10-CM | POA: Diagnosis not present

## 2021-05-26 DIAGNOSIS — D62 Acute posthemorrhagic anemia: Secondary | ICD-10-CM | POA: Diagnosis not present

## 2021-05-26 DIAGNOSIS — Z483 Aftercare following surgery for neoplasm: Secondary | ICD-10-CM | POA: Diagnosis not present

## 2021-05-26 DIAGNOSIS — Z9049 Acquired absence of other specified parts of digestive tract: Secondary | ICD-10-CM | POA: Diagnosis not present

## 2021-05-26 DIAGNOSIS — K5792 Diverticulitis of intestine, part unspecified, without perforation or abscess without bleeding: Secondary | ICD-10-CM | POA: Diagnosis not present

## 2021-05-26 NOTE — Progress Notes (Signed)
[place] Work  Initial Assessment   Mikayla Rios is a 35 y.o. year old female contacted by phone. Clinical Social Work was referred by medical oncology for assessment of psychosocial needs.   SDOH (Social Determinants of Health) assessments performed: Yes   Distress Screen completed: No No flowsheet data found.    Family/Social Information:  Housing Arrangement: patient lives with husband, goes to parents home during the day.  Lives w her children (8, 30, 32).  56 year old has special needs.   Family members/support persons in your life? Family; goes to parent's home during the day as she is unable to cook for herself, needs supervision when ambulating Transportation concerns: no, she has worked on getting into/out of car w PT, family is transporting  Employment: Unemployed. Was working before diagnosis, job had "shut down" and she did not transfer to another department, got COVID on last day of job.  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  HH RN is doing wound care.  She may need shower aide added as she is unable to shower on her own. She does have shower bench, bedside commode and walker  Coping/ Adjustment to diagnosis: Patient understands treatment plan and what happens next? yes, she has been ill since May 2022, with "one thing after another."  Now will begin treatment for Stage 2 colon cancer.  She has been in inpatient rehab and inpatient admission, weak and deconditioned.  Is unable to do the things she used to do - care for family, cook, household chores  and similar.  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  Current coping skills/ strengths: Ability for insight , Average or above average intelligence , Capable of independent living , Communication skills , and Supportive family/friends     SUMMARY: Current SDOH Barriers:  Financial constraints related to loss of income for herself as result of illness, husband has also lost/reduced income due to her care needs.   Interventions: Discussed common feeling and emotions when being diagnosed with cancer, and the importance of support during treatment Informed patient of the support team roles and support services at [facility] Provided [place] contact information and encouraged patient to call with any questions or concerns Referred patient to [place], Estate manager/land agent for [employer], [employer] for disability and Passenger transport manager.  She has already been referred by inpatient team - CSW will check in [place] as she has not heard from them yet.  Also suggested she ask [place] to [place] application.    Follow Up Plan: Patient will contact CSW with any support or resource needs Patient verbalizes understanding of plan: Yes    Beverely Pace ,  [place], LCSW Clinical Social Worker Phone:  [phone]

## 2021-05-26 NOTE — Research (Signed)
Trial:  "Randomized Trial of Standard Chemotherapy Alone or Combined with Atezolizumab as Adjuvant Therapy for Stage III Colon Cancer and Deficient DNA Mismatch Repair"  Patient Mikayla Rios was identified by Dr Burr Medico as a potential candidate for the above listed study.  This Clinical Research Nurse met with Mikayla Rios, [id number], on 05/26/21 in a manner and location that ensures patient privacy to discuss participation in the above listed research study, with RN Foye Spurling supervising.  Patient is Accompanied by her husband .  A copy of the informed consent document and separate HIPAA Authorization was provided to the patient.  Patient reads, speaks, and understands [place].   Patient was provided with the business card of this Nurse and encouraged to contact the research team with any questions.  Approximately 15 minutes were spent with the patient reviewing the informed consent documents.  Patient was provided the option of taking informed consent documents home to review and was encouraged to review at their convenience with their support network, including other care providers. Patient took the consent documents home to review. Patient and her husband were informed that the research team would work on verifying eligibility critieria. Plan made for RN to call pt Tuesday 12/27 to follow up.  [name], RN, BSN, [facility] She   Her   [facility] Clinical Research Nurse [place] [phone]   Pager [phone] 05/26/2021 3:30 PM

## 2021-05-27 ENCOUNTER — Other Ambulatory Visit: Payer: Self-pay

## 2021-05-27 ENCOUNTER — Encounter: Payer: Self-pay | Admitting: Hematology

## 2021-05-27 ENCOUNTER — Telehealth: Payer: Self-pay

## 2021-05-27 ENCOUNTER — Telehealth: Payer: Self-pay | Admitting: Hematology

## 2021-05-27 DIAGNOSIS — K5792 Diverticulitis of intestine, part unspecified, without perforation or abscess without bleeding: Secondary | ICD-10-CM | POA: Diagnosis not present

## 2021-05-27 DIAGNOSIS — Z933 Colostomy status: Secondary | ICD-10-CM | POA: Diagnosis not present

## 2021-05-27 DIAGNOSIS — E669 Obesity, unspecified: Secondary | ICD-10-CM | POA: Diagnosis not present

## 2021-05-27 DIAGNOSIS — G4730 Sleep apnea, unspecified: Secondary | ICD-10-CM | POA: Diagnosis not present

## 2021-05-27 DIAGNOSIS — F4320 Adjustment disorder, unspecified: Secondary | ICD-10-CM

## 2021-05-27 DIAGNOSIS — I10 Essential (primary) hypertension: Secondary | ICD-10-CM | POA: Diagnosis not present

## 2021-05-27 DIAGNOSIS — Z483 Aftercare following surgery for neoplasm: Secondary | ICD-10-CM | POA: Diagnosis not present

## 2021-05-27 DIAGNOSIS — E440 Moderate protein-calorie malnutrition: Secondary | ICD-10-CM | POA: Diagnosis not present

## 2021-05-27 DIAGNOSIS — Z9049 Acquired absence of other specified parts of digestive tract: Secondary | ICD-10-CM | POA: Diagnosis not present

## 2021-05-27 DIAGNOSIS — D62 Acute posthemorrhagic anemia: Secondary | ICD-10-CM | POA: Diagnosis not present

## 2021-05-27 DIAGNOSIS — C189 Malignant neoplasm of colon, unspecified: Secondary | ICD-10-CM | POA: Diagnosis not present

## 2021-05-27 DIAGNOSIS — C187 Malignant neoplasm of sigmoid colon: Secondary | ICD-10-CM

## 2021-05-27 DIAGNOSIS — E876 Hypokalemia: Secondary | ICD-10-CM | POA: Diagnosis not present

## 2021-05-27 DIAGNOSIS — Z4801 Encounter for change or removal of surgical wound dressing: Secondary | ICD-10-CM | POA: Diagnosis not present

## 2021-05-27 DIAGNOSIS — Z79891 Long term (current) use of opiate analgesic: Secondary | ICD-10-CM | POA: Diagnosis not present

## 2021-05-27 DIAGNOSIS — Z6837 Body mass index (BMI) 37.0-37.9, adult: Secondary | ICD-10-CM | POA: Diagnosis not present

## 2021-05-27 NOTE — Telephone Encounter (Signed)
Scheduled follow-up appointments per 12/19 los. Patient is aware. °

## 2021-05-27 NOTE — Telephone Encounter (Signed)
Scheduled per sch msg. Called and spoke with patient. Confirmed appt  

## 2021-05-27 NOTE — Telephone Encounter (Signed)
"  Randomized Trial of Standard Chemotherapy Alone or Combined with Atezolizumab as Adjuvant Therapy for Stage III Colon Cancer and Deficient DNA Mismatch Repair"    Spoke with Mikayla Rios about the ATOMIC trial. She does not meet criteria d/t ECOG 3 and having Stage II cancer. We discussed her not meeting eligibility criteria; she verbalized understanding. Encouraged her to call if she has questions about research in the future and thanked her for her time.  Marjie Skiff Lasondra Hodgkins, RN, BSN, [facility] She   Her   [facility] Clinical Research Nurse [facility] Direct Dial [phone]   Pager [phone] 05/27/2021 2:09 PM

## 2021-05-28 DIAGNOSIS — C187 Malignant neoplasm of sigmoid colon: Secondary | ICD-10-CM

## 2021-05-28 NOTE — Progress Notes (Signed)
Order placed for PICC placement per DR Burr Medico LOS dated 05/25/2021.

## 2021-05-29 DIAGNOSIS — K5792 Diverticulitis of intestine, part unspecified, without perforation or abscess without bleeding: Secondary | ICD-10-CM | POA: Diagnosis not present

## 2021-05-29 DIAGNOSIS — C189 Malignant neoplasm of colon, unspecified: Secondary | ICD-10-CM | POA: Diagnosis not present

## 2021-05-29 DIAGNOSIS — Z933 Colostomy status: Secondary | ICD-10-CM | POA: Diagnosis not present

## 2021-05-29 DIAGNOSIS — Z483 Aftercare following surgery for neoplasm: Secondary | ICD-10-CM | POA: Diagnosis not present

## 2021-05-29 DIAGNOSIS — Z4801 Encounter for change or removal of surgical wound dressing: Secondary | ICD-10-CM | POA: Diagnosis not present

## 2021-05-29 DIAGNOSIS — E669 Obesity, unspecified: Secondary | ICD-10-CM | POA: Diagnosis not present

## 2021-05-29 DIAGNOSIS — D62 Acute posthemorrhagic anemia: Secondary | ICD-10-CM | POA: Diagnosis not present

## 2021-05-29 DIAGNOSIS — G4730 Sleep apnea, unspecified: Secondary | ICD-10-CM | POA: Diagnosis not present

## 2021-05-29 DIAGNOSIS — I10 Essential (primary) hypertension: Secondary | ICD-10-CM | POA: Diagnosis not present

## 2021-05-29 DIAGNOSIS — Z79891 Long term (current) use of opiate analgesic: Secondary | ICD-10-CM | POA: Diagnosis not present

## 2021-05-29 DIAGNOSIS — E440 Moderate protein-calorie malnutrition: Secondary | ICD-10-CM | POA: Diagnosis not present

## 2021-05-29 DIAGNOSIS — Z6837 Body mass index (BMI) 37.0-37.9, adult: Secondary | ICD-10-CM | POA: Diagnosis not present

## 2021-05-29 DIAGNOSIS — E876 Hypokalemia: Secondary | ICD-10-CM | POA: Diagnosis not present

## 2021-05-29 DIAGNOSIS — Z9049 Acquired absence of other specified parts of digestive tract: Secondary | ICD-10-CM | POA: Diagnosis not present

## 2021-06-01 ENCOUNTER — Other Ambulatory Visit: Payer: Self-pay | Admitting: Hematology

## 2021-06-02 DIAGNOSIS — C189 Malignant neoplasm of colon, unspecified: Secondary | ICD-10-CM | POA: Diagnosis not present

## 2021-06-02 DIAGNOSIS — Z6837 Body mass index (BMI) 37.0-37.9, adult: Secondary | ICD-10-CM | POA: Diagnosis not present

## 2021-06-02 DIAGNOSIS — G4730 Sleep apnea, unspecified: Secondary | ICD-10-CM | POA: Diagnosis not present

## 2021-06-02 DIAGNOSIS — Z933 Colostomy status: Secondary | ICD-10-CM | POA: Diagnosis not present

## 2021-06-02 DIAGNOSIS — I10 Essential (primary) hypertension: Secondary | ICD-10-CM | POA: Diagnosis not present

## 2021-06-02 DIAGNOSIS — E876 Hypokalemia: Secondary | ICD-10-CM | POA: Diagnosis not present

## 2021-06-02 DIAGNOSIS — D62 Acute posthemorrhagic anemia: Secondary | ICD-10-CM | POA: Diagnosis not present

## 2021-06-02 DIAGNOSIS — E669 Obesity, unspecified: Secondary | ICD-10-CM | POA: Diagnosis not present

## 2021-06-02 DIAGNOSIS — E440 Moderate protein-calorie malnutrition: Secondary | ICD-10-CM | POA: Diagnosis not present

## 2021-06-02 DIAGNOSIS — Z483 Aftercare following surgery for neoplasm: Secondary | ICD-10-CM | POA: Diagnosis not present

## 2021-06-02 DIAGNOSIS — Z79891 Long term (current) use of opiate analgesic: Secondary | ICD-10-CM | POA: Diagnosis not present

## 2021-06-02 DIAGNOSIS — Z4801 Encounter for change or removal of surgical wound dressing: Secondary | ICD-10-CM | POA: Diagnosis not present

## 2021-06-02 DIAGNOSIS — K5792 Diverticulitis of intestine, part unspecified, without perforation or abscess without bleeding: Secondary | ICD-10-CM | POA: Diagnosis not present

## 2021-06-02 DIAGNOSIS — Z9049 Acquired absence of other specified parts of digestive tract: Secondary | ICD-10-CM | POA: Diagnosis not present

## 2021-06-03 ENCOUNTER — Other Ambulatory Visit: Payer: Self-pay | Admitting: Hematology

## 2021-06-03 ENCOUNTER — Encounter: Payer: Self-pay | Admitting: Hematology

## 2021-06-03 DIAGNOSIS — I10 Essential (primary) hypertension: Secondary | ICD-10-CM | POA: Diagnosis not present

## 2021-06-03 DIAGNOSIS — E876 Hypokalemia: Secondary | ICD-10-CM | POA: Diagnosis not present

## 2021-06-03 DIAGNOSIS — Z9049 Acquired absence of other specified parts of digestive tract: Secondary | ICD-10-CM | POA: Diagnosis not present

## 2021-06-03 DIAGNOSIS — K5792 Diverticulitis of intestine, part unspecified, without perforation or abscess without bleeding: Secondary | ICD-10-CM | POA: Diagnosis not present

## 2021-06-03 DIAGNOSIS — G4730 Sleep apnea, unspecified: Secondary | ICD-10-CM | POA: Diagnosis not present

## 2021-06-03 DIAGNOSIS — D62 Acute posthemorrhagic anemia: Secondary | ICD-10-CM | POA: Diagnosis not present

## 2021-06-03 DIAGNOSIS — E440 Moderate protein-calorie malnutrition: Secondary | ICD-10-CM | POA: Diagnosis not present

## 2021-06-03 DIAGNOSIS — Z79891 Long term (current) use of opiate analgesic: Secondary | ICD-10-CM | POA: Diagnosis not present

## 2021-06-03 DIAGNOSIS — Z6837 Body mass index (BMI) 37.0-37.9, adult: Secondary | ICD-10-CM | POA: Diagnosis not present

## 2021-06-03 DIAGNOSIS — C189 Malignant neoplasm of colon, unspecified: Secondary | ICD-10-CM | POA: Diagnosis not present

## 2021-06-03 DIAGNOSIS — Z483 Aftercare following surgery for neoplasm: Secondary | ICD-10-CM | POA: Diagnosis not present

## 2021-06-03 DIAGNOSIS — E669 Obesity, unspecified: Secondary | ICD-10-CM | POA: Diagnosis not present

## 2021-06-03 DIAGNOSIS — Z933 Colostomy status: Secondary | ICD-10-CM | POA: Diagnosis not present

## 2021-06-03 DIAGNOSIS — Z4801 Encounter for change or removal of surgical wound dressing: Secondary | ICD-10-CM | POA: Diagnosis not present

## 2021-06-03 MED ORDER — OXYCODONE-ACETAMINOPHEN 5-325 MG PO TABS
1.0000 | ORAL_TABLET | Freq: Three times a day (TID) | ORAL | 0 refills | Status: DC | PRN
Start: 2021-06-03 — End: 2021-06-12

## 2021-06-05 DIAGNOSIS — E669 Obesity, unspecified: Secondary | ICD-10-CM | POA: Diagnosis not present

## 2021-06-05 DIAGNOSIS — G4730 Sleep apnea, unspecified: Secondary | ICD-10-CM | POA: Diagnosis not present

## 2021-06-05 DIAGNOSIS — Z9049 Acquired absence of other specified parts of digestive tract: Secondary | ICD-10-CM | POA: Diagnosis not present

## 2021-06-05 DIAGNOSIS — E440 Moderate protein-calorie malnutrition: Secondary | ICD-10-CM | POA: Diagnosis not present

## 2021-06-05 DIAGNOSIS — Z4801 Encounter for change or removal of surgical wound dressing: Secondary | ICD-10-CM | POA: Diagnosis not present

## 2021-06-05 DIAGNOSIS — E876 Hypokalemia: Secondary | ICD-10-CM | POA: Diagnosis not present

## 2021-06-05 DIAGNOSIS — K5792 Diverticulitis of intestine, part unspecified, without perforation or abscess without bleeding: Secondary | ICD-10-CM | POA: Diagnosis not present

## 2021-06-05 DIAGNOSIS — Z933 Colostomy status: Secondary | ICD-10-CM | POA: Diagnosis not present

## 2021-06-05 DIAGNOSIS — C189 Malignant neoplasm of colon, unspecified: Secondary | ICD-10-CM | POA: Diagnosis not present

## 2021-06-05 DIAGNOSIS — Z6837 Body mass index (BMI) 37.0-37.9, adult: Secondary | ICD-10-CM | POA: Diagnosis not present

## 2021-06-05 DIAGNOSIS — Z483 Aftercare following surgery for neoplasm: Secondary | ICD-10-CM | POA: Diagnosis not present

## 2021-06-05 DIAGNOSIS — D62 Acute posthemorrhagic anemia: Secondary | ICD-10-CM | POA: Diagnosis not present

## 2021-06-05 DIAGNOSIS — Z79891 Long term (current) use of opiate analgesic: Secondary | ICD-10-CM | POA: Diagnosis not present

## 2021-06-05 DIAGNOSIS — I10 Essential (primary) hypertension: Secondary | ICD-10-CM | POA: Diagnosis not present

## 2021-06-09 ENCOUNTER — Inpatient Hospital Stay: Payer: BC Managed Care – PPO | Attending: Hematology | Admitting: Nutrition

## 2021-06-09 ENCOUNTER — Inpatient Hospital Stay: Payer: BC Managed Care – PPO

## 2021-06-09 ENCOUNTER — Other Ambulatory Visit: Payer: Self-pay

## 2021-06-09 ENCOUNTER — Encounter: Payer: Self-pay | Admitting: General Surgery

## 2021-06-09 DIAGNOSIS — C189 Malignant neoplasm of colon, unspecified: Secondary | ICD-10-CM | POA: Diagnosis not present

## 2021-06-09 DIAGNOSIS — Z6837 Body mass index (BMI) 37.0-37.9, adult: Secondary | ICD-10-CM | POA: Diagnosis not present

## 2021-06-09 DIAGNOSIS — Z9049 Acquired absence of other specified parts of digestive tract: Secondary | ICD-10-CM | POA: Diagnosis not present

## 2021-06-09 DIAGNOSIS — D259 Leiomyoma of uterus, unspecified: Secondary | ICD-10-CM | POA: Insufficient documentation

## 2021-06-09 DIAGNOSIS — Z79899 Other long term (current) drug therapy: Secondary | ICD-10-CM | POA: Insufficient documentation

## 2021-06-09 DIAGNOSIS — R109 Unspecified abdominal pain: Secondary | ICD-10-CM | POA: Insufficient documentation

## 2021-06-09 DIAGNOSIS — R634 Abnormal weight loss: Secondary | ICD-10-CM | POA: Insufficient documentation

## 2021-06-09 DIAGNOSIS — K5792 Diverticulitis of intestine, part unspecified, without perforation or abscess without bleeding: Secondary | ICD-10-CM | POA: Diagnosis not present

## 2021-06-09 DIAGNOSIS — E669 Obesity, unspecified: Secondary | ICD-10-CM | POA: Diagnosis not present

## 2021-06-09 DIAGNOSIS — G893 Neoplasm related pain (acute) (chronic): Secondary | ICD-10-CM | POA: Insufficient documentation

## 2021-06-09 DIAGNOSIS — J90 Pleural effusion, not elsewhere classified: Secondary | ICD-10-CM | POA: Insufficient documentation

## 2021-06-09 DIAGNOSIS — K529 Noninfective gastroenteritis and colitis, unspecified: Secondary | ICD-10-CM | POA: Insufficient documentation

## 2021-06-09 DIAGNOSIS — Z4801 Encounter for change or removal of surgical wound dressing: Secondary | ICD-10-CM | POA: Diagnosis not present

## 2021-06-09 DIAGNOSIS — Z800 Family history of malignant neoplasm of digestive organs: Secondary | ICD-10-CM | POA: Insufficient documentation

## 2021-06-09 DIAGNOSIS — I10 Essential (primary) hypertension: Secondary | ICD-10-CM | POA: Diagnosis not present

## 2021-06-09 DIAGNOSIS — K8020 Calculus of gallbladder without cholecystitis without obstruction: Secondary | ICD-10-CM | POA: Insufficient documentation

## 2021-06-09 DIAGNOSIS — E440 Moderate protein-calorie malnutrition: Secondary | ICD-10-CM | POA: Diagnosis not present

## 2021-06-09 DIAGNOSIS — Z483 Aftercare following surgery for neoplasm: Secondary | ICD-10-CM | POA: Diagnosis not present

## 2021-06-09 DIAGNOSIS — G4730 Sleep apnea, unspecified: Secondary | ICD-10-CM | POA: Insufficient documentation

## 2021-06-09 DIAGNOSIS — Z5111 Encounter for antineoplastic chemotherapy: Secondary | ICD-10-CM | POA: Insufficient documentation

## 2021-06-09 DIAGNOSIS — D62 Acute posthemorrhagic anemia: Secondary | ICD-10-CM | POA: Diagnosis not present

## 2021-06-09 DIAGNOSIS — Z933 Colostomy status: Secondary | ICD-10-CM | POA: Diagnosis not present

## 2021-06-09 DIAGNOSIS — Z79891 Long term (current) use of opiate analgesic: Secondary | ICD-10-CM | POA: Diagnosis not present

## 2021-06-09 DIAGNOSIS — C187 Malignant neoplasm of sigmoid colon: Secondary | ICD-10-CM | POA: Insufficient documentation

## 2021-06-09 DIAGNOSIS — E876 Hypokalemia: Secondary | ICD-10-CM | POA: Diagnosis not present

## 2021-06-09 DIAGNOSIS — F418 Other specified anxiety disorders: Secondary | ICD-10-CM | POA: Insufficient documentation

## 2021-06-09 DIAGNOSIS — D3502 Benign neoplasm of left adrenal gland: Secondary | ICD-10-CM | POA: Insufficient documentation

## 2021-06-09 NOTE — Progress Notes (Signed)
Pharmacist Chemotherapy Monitoring - Initial Assessment    Anticipated start date: 06/16/21   The following has been reviewed per standard work regarding the patient's treatment regimen: The patient's diagnosis, treatment plan and drug doses, and organ/hematologic function Lab orders and baseline tests specific to treatment regimen  The treatment plan start date, drug sequencing, and pre-medications Prior authorization status  Patient's documented medication list, including drug-drug interaction screen and prescriptions for anti-emetics and supportive care specific to the treatment regimen The drug concentrations, fluid compatibility, administration routes, and timing of the medications to be used The patient's access for treatment and lifetime cumulative dose history, if applicable  The patient's medication allergies and previous infusion related reactions, if applicable   Changes made to treatment plan:  N/A  Follow up needed:  Pending authorization for treatment   Benn Moulder, PharmD Pharmacy Resident  06/09/2021 3:00 PM

## 2021-06-09 NOTE — Progress Notes (Signed)
36 year old female diagnosed with colorectal cancer status post colectomy with colostomy.  She is a patient of Dr. Burr Medico.  Plan is for FOLFOX for 6 months.  First infusion is scheduled for January 10.  Past medical history includes anxiety, depression, hypertension, obesity.  Patient reports history of diverticulitis.  Medications include Cymbalta, Lasix, Zofran, Protonix, Compazine.  Labs reviewed.  Height: 5 feet 3 inches. Weight: 193.3 pounds December 19. Usual body weight: Approximately 300 pounds in May 2022. BMI: 34.24.  Patient reports she intentionally lost approximately 30-40 pounds initially.  Then she lost an additional 50+ pounds since July 2022.  This was unintentional. She reports her wound is healing well.   She continues to have increased pain and states medication is not helping. She states her appetite is better.   She denies nausea and vomiting. Reports colostomy output is good and she empties her bag 1-2 times every day. Reports drinking 2-16 ounce bottles of water daily.  She eats Ramen or soup or eggs and grits for breakfast.  She drinks orange juice or water with breakfast.  She typically does not eat a meal again until the evening meal and that includes a protein food and a starch.  She sometimes eats Pasta.  She reports she does snack some throughout the day.  She has been drinking 1-2 cartons of oral nutrition supplements daily and appears to like these.  Nutrition diagnosis: Unintentional weight loss related to colorectal cancer as evidenced by 23% weight loss in approximately 8 months which is significant.  Intervention: Reviewed diet after colostomy.  Stressed importance of small frequent meals and snacks along with adequate calories and protein to achieve weight maintenance.   Reviewed high-protein oral nutrition supplements and recommended patient continue at least 2 daily. Stressed importance of increasing overall fluid intake.  Educated guidelines of 80 to  100 ounces of water daily. Provided nutrition fact sheets.  Questions were answered.  Teach back method used.  Contact information provided.  Monitoring, evaluation, goals: Patient will tolerate increased calories and protein to achieve weight maintenance and continued healing.  Next visit: To be scheduled with upcoming treatment.  **Disclaimer: This note was dictated with voice recognition software. Similar sounding words can inadvertently be transcribed and this note may contain transcription errors which may not have been corrected upon publication of note.**

## 2021-06-09 NOTE — Progress Notes (Unsigned)
Patient called for increased pain over the last 1-2 days. She spoke with Dr. Zenia Resides yesterday who advised her to go from one Percocet every eight hours to one every six hours. She has done this today and is still in significant pain. She has also taken her Robaxin. She is having soft output in her ostomy.   She has an appointment Thursday w Dr gross. I advised her to take two Percocet q6 hours overnight. I also discussed trying a lumbar support. She has been increasing her activity with PT. She denies fever/chills.

## 2021-06-09 NOTE — Progress Notes (Signed)
Faxed referral to [facility] for PICC line care.  Pt is scheduled to get PICC placed on 06/16/2021.  Pt will require teaching on how to flush PICC line daily when not in use.  Referral order for home health placed as well.  Awaiting response from [place].

## 2021-06-10 ENCOUNTER — Telehealth: Payer: Self-pay

## 2021-06-10 ENCOUNTER — Encounter: Payer: BC Managed Care – PPO | Admitting: Genetic Counselor

## 2021-06-10 ENCOUNTER — Other Ambulatory Visit: Payer: Self-pay | Admitting: Hematology

## 2021-06-10 NOTE — Telephone Encounter (Signed)
Transition Care Management Follow-up Telephone Call Date of discharge and from where: 05/15/21 from [facility] How have you been since you were released from the hospital? Had some issues with pain, but reached out to surgeon and oncologist for recommendations, reports pain better managed now.  Any questions or concerns? No  Items Reviewed: Did the pt receive and understand the discharge instructions provided? Yes  Medications obtained and verified? Yes  Other? No  Any new allergies since your discharge? No  Dietary orders reviewed? Yes Do you have support at home? Yes   Home Care and Equipment/Supplies: Were home health services ordered? Yes If so, what is the name of the agency?  [facility] Has the agency set up a time to come to the patient's home? yes Were any new equipment or medical supplies ordered?  Yes: shower bench/walker/BSC What is the name of the medical supply agency? Received in hospital  Were you able to get the supplies/equipment? yes Do you have any questions related to the use of the equipment or supplies? No  Functional Questionnaire: (I = Independent and D = Dependent) ADLs: with assistance  Bathing/Dressing- I with assistance  Meal Prep- I with assistance  Eating- I  Maintaining continence- I  Transferring/Ambulation- I with supervision  Managing Meds- I  Follow up appointments reviewed:  PCP Hospital f/u appt confirmed? Following up with specialist  Specialist Hospital f/u appt confirmed? Yes  Saw oncologist Dr. Burr Medico on 05/25/21. Scheduled to see: Dr. Johney Maine (general surgeon) 06/11/21 at [address] and Danella Sensing, NP (physical Med and rehab) on 06/15/20 @ 1pm  Are transportation arrangements needed? No  If their condition worsens, is the pt aware to call PCP or go to the Emergency Dept.? Yes Was the patient provided with contact information for the PCP's office or ED? Yes Was to pt encouraged to call back with questions or concerns?  Yes   Thea Silversmith, RN, MSN, BSN, [place] Care Management Coordinator [phone]

## 2021-06-11 ENCOUNTER — Encounter: Payer: Self-pay | Admitting: Hematology

## 2021-06-11 NOTE — Progress Notes (Signed)
Called pt to introduce myself as her Arboriculturist.  Unfortunately there aren't any foundations offering copay assistance for her Dx and the type of ins she has.  I informed her of the [employer], went over what it covers and gave her the income requirement.  Pt would like to apply so she will bring proof of income on 06/12/21.  If approved I will give her an expense sheet and my card for any questions or concerns she may have in the future.

## 2021-06-12 ENCOUNTER — Inpatient Hospital Stay (HOSPITAL_BASED_OUTPATIENT_CLINIC_OR_DEPARTMENT_OTHER): Payer: BC Managed Care – PPO | Admitting: Hematology

## 2021-06-12 ENCOUNTER — Encounter: Payer: Self-pay | Admitting: Hematology

## 2021-06-12 ENCOUNTER — Other Ambulatory Visit: Payer: Self-pay

## 2021-06-12 ENCOUNTER — Inpatient Hospital Stay (HOSPITAL_BASED_OUTPATIENT_CLINIC_OR_DEPARTMENT_OTHER): Payer: BC Managed Care – PPO | Admitting: Nurse Practitioner

## 2021-06-12 ENCOUNTER — Encounter: Payer: Self-pay | Admitting: Internal Medicine

## 2021-06-12 VITALS — BP 139/89 | HR 102 | Temp 99.0°F | Resp 18 | Ht 63.0 in | Wt 192.2 lb

## 2021-06-12 DIAGNOSIS — J90 Pleural effusion, not elsewhere classified: Secondary | ICD-10-CM | POA: Diagnosis not present

## 2021-06-12 DIAGNOSIS — Z6837 Body mass index (BMI) 37.0-37.9, adult: Secondary | ICD-10-CM | POA: Diagnosis not present

## 2021-06-12 DIAGNOSIS — E440 Moderate protein-calorie malnutrition: Secondary | ICD-10-CM | POA: Diagnosis not present

## 2021-06-12 DIAGNOSIS — C187 Malignant neoplasm of sigmoid colon: Secondary | ICD-10-CM | POA: Diagnosis not present

## 2021-06-12 DIAGNOSIS — D259 Leiomyoma of uterus, unspecified: Secondary | ICD-10-CM | POA: Diagnosis not present

## 2021-06-12 DIAGNOSIS — K5792 Diverticulitis of intestine, part unspecified, without perforation or abscess without bleeding: Secondary | ICD-10-CM | POA: Diagnosis not present

## 2021-06-12 DIAGNOSIS — C189 Malignant neoplasm of colon, unspecified: Secondary | ICD-10-CM | POA: Diagnosis not present

## 2021-06-12 DIAGNOSIS — Z933 Colostomy status: Secondary | ICD-10-CM | POA: Diagnosis not present

## 2021-06-12 DIAGNOSIS — Z4801 Encounter for change or removal of surgical wound dressing: Secondary | ICD-10-CM | POA: Diagnosis not present

## 2021-06-12 DIAGNOSIS — Z9049 Acquired absence of other specified parts of digestive tract: Secondary | ICD-10-CM | POA: Diagnosis not present

## 2021-06-12 DIAGNOSIS — K529 Noninfective gastroenteritis and colitis, unspecified: Secondary | ICD-10-CM | POA: Diagnosis not present

## 2021-06-12 DIAGNOSIS — Z483 Aftercare following surgery for neoplasm: Secondary | ICD-10-CM | POA: Diagnosis not present

## 2021-06-12 DIAGNOSIS — Z5111 Encounter for antineoplastic chemotherapy: Secondary | ICD-10-CM | POA: Diagnosis not present

## 2021-06-12 DIAGNOSIS — D62 Acute posthemorrhagic anemia: Secondary | ICD-10-CM | POA: Diagnosis not present

## 2021-06-12 DIAGNOSIS — Z800 Family history of malignant neoplasm of digestive organs: Secondary | ICD-10-CM | POA: Diagnosis not present

## 2021-06-12 DIAGNOSIS — G893 Neoplasm related pain (acute) (chronic): Secondary | ICD-10-CM | POA: Diagnosis not present

## 2021-06-12 DIAGNOSIS — Z515 Encounter for palliative care: Secondary | ICD-10-CM | POA: Diagnosis not present

## 2021-06-12 DIAGNOSIS — R109 Unspecified abdominal pain: Secondary | ICD-10-CM | POA: Diagnosis not present

## 2021-06-12 DIAGNOSIS — R530 Neoplastic (malignant) related fatigue: Secondary | ICD-10-CM | POA: Diagnosis not present

## 2021-06-12 DIAGNOSIS — K8020 Calculus of gallbladder without cholecystitis without obstruction: Secondary | ICD-10-CM | POA: Diagnosis not present

## 2021-06-12 DIAGNOSIS — E669 Obesity, unspecified: Secondary | ICD-10-CM | POA: Diagnosis not present

## 2021-06-12 DIAGNOSIS — G4730 Sleep apnea, unspecified: Secondary | ICD-10-CM | POA: Diagnosis not present

## 2021-06-12 DIAGNOSIS — D3502 Benign neoplasm of left adrenal gland: Secondary | ICD-10-CM | POA: Diagnosis not present

## 2021-06-12 DIAGNOSIS — I10 Essential (primary) hypertension: Secondary | ICD-10-CM | POA: Diagnosis not present

## 2021-06-12 DIAGNOSIS — Z79899 Other long term (current) drug therapy: Secondary | ICD-10-CM | POA: Diagnosis not present

## 2021-06-12 DIAGNOSIS — F418 Other specified anxiety disorders: Secondary | ICD-10-CM | POA: Diagnosis not present

## 2021-06-12 DIAGNOSIS — E876 Hypokalemia: Secondary | ICD-10-CM | POA: Diagnosis not present

## 2021-06-12 DIAGNOSIS — Z79891 Long term (current) use of opiate analgesic: Secondary | ICD-10-CM | POA: Diagnosis not present

## 2021-06-12 DIAGNOSIS — R531 Weakness: Secondary | ICD-10-CM | POA: Diagnosis not present

## 2021-06-12 DIAGNOSIS — R300 Dysuria: Secondary | ICD-10-CM | POA: Diagnosis not present

## 2021-06-12 DIAGNOSIS — R1084 Generalized abdominal pain: Secondary | ICD-10-CM

## 2021-06-12 DIAGNOSIS — R634 Abnormal weight loss: Secondary | ICD-10-CM | POA: Diagnosis not present

## 2021-06-12 MED ORDER — OXYCODONE-ACETAMINOPHEN 7.5-325 MG PO TABS: 1.0000 | ORAL_TABLET | Freq: Four times a day (QID) | ORAL | 0 refills | Status: DC | PRN

## 2021-06-12 MED ORDER — GABAPENTIN 300 MG PO CAPS: 300.0000 mg | ORAL_CAPSULE | Freq: Every day | ORAL | 0 refills | Status: DC

## 2021-06-12 NOTE — Progress Notes (Signed)
Pt is approved for the $1000 [employer] grant.  

## 2021-06-12 NOTE — Progress Notes (Signed)
[place]  [phone] [phone]   Name: Mikayla Rios Date: 06/12/2021 MRN: [id number]  DOB: Dec 30, 1985  Patient Care Team: [name], MD as PCP - General (Internal Medicine) [name], MD as PCP - Cardiology (Cardiology) [name], [name] as Physician Assistant (Emergency Medicine) [name], MD as Consulting Physician (Oncology) [name], RN as Oncology Nurse Navigator (Oncology) [name], MD as Consulting Physician (General Surgery) [name], [name], MD as Consulting Physician (Neurology) [name], [name], MD as Consulting Physician (Obstetrics and Gynecology) [name], MD as Consulting Physician (Gastroenterology)    REASON FOR CONSULTATION: Mikayla Rios is a 36 y.o. 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  Palliative ask to see for symptom management.   SOCIAL HISTORY:     reports that she has never smoked. She has never used smokeless tobacco. She reports that she does not currently use alcohol. She reports that she does not use drugs.  ADVANCE DIRECTIVES:  None on file  CODE STATUS: Full code  PAST MEDICAL HISTORY: Past Medical History:  Diagnosis Date   Allergy    seasonal   Anemia    Blood infection 1985   Blood transfusion without reported diagnosis    Diverticulitis    Hypertension    Obesity    Sleep apnea     PAST SURGICAL HISTORY:  Past Surgical History:  Procedure Laterality Date   COLECTOMY WITH COLOSTOMY CREATION/HARTMANN PROCEDURE N/A 04/15/2021   Procedure: COLOSTOMY CREATION/HARTMANN PROCEDURE;  Surgeon: [name], MD;  Location: [facility];  Service: General;  Laterality: N/A;   IR CATHETER TUBE CHANGE  12/12/2020   IR CATHETER TUBE CHANGE  01/27/2021   IR CATHETER TUBE CHANGE   02/19/2021   IR CATHETER TUBE CHANGE  04/13/2021   IR RADIOLOGIST EVAL & MGMT  11/27/2020   IR RADIOLOGIST EVAL & MGMT  12/11/2020   IR RADIOLOGIST EVAL & MGMT  01/07/2021   IR RADIOLOGIST EVAL & MGMT  02/04/2021   IR SINUS/FIST TUBE CHK-NON GI  12/12/2020   IR SINUS/FIST TUBE CHK-NON GI  02/19/2021   LAPAROSCOPIC PARTIAL COLECTOMY N/A 04/15/2021   Procedure: LAPAROSCOPIC ASSISTED HARTMANN RESECTION;  Surgeon: [name], MD;  Location: [facility];  Service: General;  Laterality: N/A;    HEMATOLOGY/ONCOLOGY HISTORY:  Oncology History  Malignant neoplasm of sigmoid colon ([place])   Initial Diagnosis   Malignant neoplasm of sigmoid colon ([place])   11/04/2020 Imaging   EXAM: CT ABDOMEN AND PELVIS WITHOUT CONTRAST  IMPRESSION: 1. Perforating descending colonic diverticulitis with multiple abdominopelvic gas and fluid collections, measuring up to 5.7 cm and further described above. 2. Cholelithiasis without findings of acute cholecystitis. 3. 3.6 cm benign left adrenal adenoma. 4. Leiomyomatous uterus. 5. 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   11/07/2020 Imaging   EXAM: CT ABDOMEN AND PELVIS WITHOUT CONTRAST  IMPRESSION: Continued wall thickening is seen involving descending colon suggesting infectious or inflammatory colitis or perforated diverticulitis. There is an adjacent fluid collection measuring 7.0 x 5.0 cm consistent with abscess which is significantly enlarged compared to prior exam. Adjacent to the abscess, there is a severely thickened small bowel loop most consistent with secondary inflammation.   5.2 x 3.5 cm fluid collection is noted within the left psoas muscle consistent with  abscess which is significantly enlarged compared to prior exam. 4.4 x 3.8 cm fluid collection consistent with abscess is noted in the left retroperitoneal region which is also enlarged compared to  prior exam.   Fibroid uterus.   Cholelithiasis.   11/27/2020 Imaging   EXAM: CT ABDOMEN AND PELVIS WITHOUT CONTRAST  IMPRESSION: 1. Significantly interval decreased size of the previously visualized left retroperitoneal and left anterior mid abdominal fluid collections. There is persistent fat stranding and mural thickening of the mid descending colon at this level. 2. Unchanged leiomyomatous uterus. 3. Unchanged cholelithiasis.   12/11/2020 Imaging   EXAM: CT ABDOMEN AND PELVIS WITHOUT CONTRAST  IMPRESSION: 1. Stable inflammatory changes centered around the distal descending colon in the left lower abdomen. Pericolonic inflammatory changes have minimally changed since 11/27/2020. Small pocket of gas medial to the colon is probably associated with a fistula or small residual abscess collection. 2. Stable position of the two percutaneous drains. The more anterior drain may be extending through a portion of the small bowel. 3. Cholelithiasis. 4. Fibroid uterus. Cannot exclude an ovarian/adnexal cystic structure near the uterine fundus. 5. Left adrenal adenoma.   01/07/2021 Imaging   EXAM: CT ABDOMEN AND PELVIS WITH CONTRAST  IMPRESSION: 1. No new abscesses identified. Similar degree of soft tissue thickening seen in the left pericolonic region. The degree of persistent soft tissues thickening in the pericolonic space further raises suspicions for malignancy. Further evaluation with colonoscopy should be performed. 2. Left retroperitoneal abscess drain unchanged in position. Anterior left abdominal drain again seen terminating within small bowel loop. 3. 2.6 cm mildly sclerotic lesion noted in the L2 vertebral body. Further evaluation with contrast enhanced lumbar spine MRI should be performed.   02/04/2021 Imaging   EXAM: CT ABDOMEN AND PELVIS WITH CONTRAST  IMPRESSION: No new abdominopelvic collections or abscess development in the 1 month interval.   Left anterior drain  remains within a loop of small bowel, unchanged.   Left lateral abscess drain remains in the retroperitoneal space adjacent to the iliopsoas muscle with a small amount of residual fluid and air but no measurable collection.   Stable soft tissue prominence and pericolonic strandy edema/inflammation about the left descending colon compatible with residual diverticulitis/colitis. Difficult to exclude underlying transmural lesion.   04/01/2021 Imaging   EXAM: CT ABDOMEN AND PELVIS WITH CONTRAST  IMPRESSION: There appears to be significant enhancement and wall thickening involving the descending colon with some degree of traction and involvement of adjacent small bowel loops. This is consistent with the history of diverticulitis and perforation. Stable position of percutaneous drainage catheter is seen adjacent to left psoas muscle with no significant residual fluid remaining. The other percutaneous drainage catheter that was previously noted to be within small bowel loop on prior exam in the left lower quadrant, has significantly retracted and appears to be outside of the peritoneal space at this time.   Since the prior exam, there does appear to be some degree of rotation involving mesenteric vessels and structures in the right lower quadrant, suggesting partial volvulus or malrotation. There is the interval development of several lymph nodes in this area, most likely inflammatory or reactive in etiology. Mild amount of free fluid is also noted in the pelvis. However, no significant bowel wall thickening or dilatation is seen in this area. These results will be called to the ordering clinician or representative by the Radiologist Assistant, and communication documented in the PACS or zVision Dashboard.   Stable uterine fibroid.   Hepatic steatosis.  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   Cholelithiasis.   04/10/2021 Imaging   EXAM: CT ANGIOGRAPHY CHEST CT ABDOMEN AND PELVIS WITH  CONTRAST  IMPRESSION: 1. Again seen are findings compatible with descending colon diverticulitis with perforation. Free air and inflammation have increased in the interval. 2. New lobulated enhancing fluid collection posterior to this segment of inflamed colon measuring 8.5 x 3.5 x 10.0 cm. This collection now invades the adjacent iliopsoas muscle as well as extends through the left lateral abdominal wall. The tip of the drainage catheter is in this collection. Findings are compatible with abscess. 3. Anterior left percutaneous drainage catheter tip has been pulled back and is now within the subcutaneous tissues. 4. Trace free fluid. 5. No pulmonary embolism.  No acute cardiopulmonary process. 6. Cholelithiasis. 7. Fatty infiltration of the liver.   04/15/2021 Definitive Surgery   FINAL MICROSCOPIC DIAGNOSIS:   A. SMALL BOWEL, RESECTION:  - Adenocarcinoma.  - No carcinoma identified in 1 lymph node.   B. PERFORATED LEFT COLON, RESECTION:  - Moderately differentiated colonic adenocarcinoma.  - Tumor extends into pericolonic adipose tissue, and is strongly  suggestive of invasion into small bowel.  See oncology table/comments.  - No carcinoma identified in 4 lymph nodes (0/4).  - Tubular adenoma with high-grade dysplasia, 1.  - Tubular adenomas with low grade dysplasia, 3.   Comments: The size of the tumor is difficult to estimate secondary to the disrupted nature of the specimen, as well as the infiltrative nature of the tumor.  Tumor can be identified as definitely invading into the pericolonic adipose tissue (block B4), but given the extreme disruption of the tissue, and the involvement of the small bowel by what appears to be a colonic adenocarcinoma, I favor perforation of the large bowel with direct invasion into the small bowel.  Accordingly, I believe this is best regarded as a pT4b lesion.   ADDENDUM:  Mismatch Repair Protein (IHC)  SUMMARY INTERPRETATION: ABNORMAL  There  is loss of the major MMR protein [name]6: This indicates a high probability that a hereditary germline mutation is present and referral to genetic counseling is warranted. It is recommended that the loss of protein expression be correlated with molecular based microsatellite instability testing.   IHC EXPRESSION RESULTS  TEST           RESULT  MLH1:          Preserved nuclear expression  [name]2:          Preserved nuclear expression  [name]6:          LOSS OF NUCLEAR EXPRESSION  PMS2:          Preserved nuclear expression    04/15/2021 Cancer Staging   Staging form: Colon and Rectum, AJCC 8th Edition - Pathologic stage from 04/15/2021: Stage IIC (pT4b, pN0, cM0) - Signed by [name], MD on 06/12/2021 Stage prefix: Initial diagnosis Total positive nodes: 0 Histologic grading system: 4 grade system Histologic grade (G): G2 Residual tumor (R): R0 - None    04/29/2021 Imaging   EXAM: CT ABDOMEN AND PELVIS WITH CONTRAST  IMPRESSION: Continued left retroperitoneal abscesses inferior to the left kidney, involving the left psoas muscle and left abdominal wall musculature. Overall size is decreased since prior study. Interval removal of left lower quadrant abscess drainage catheter.   New fluid collection in the cul-de-sac and wrapping around the uterus concerning for abscess.   Cholelithiasis.   Small left pleural effusion.  Bibasilar atelectasis.   Stable left adrenal  adenoma.  ADDENDUM: After discussing the case with Dr. Dwaine Gale in interventional radiology, it was noted that the fluid in the pelvis originally thought to be in the cul-de-sac is likely within the vagina, best seen on sagittal imaging. Recommend speculum exam.   Also, in the left lateral wall in the area of prior abscess in the lateral wall abdominal musculature, some of the abdomen soft tissue appears to be enhancing. While this could be infectious, cannot exclude tumor seeding in the left lateral abdominal wall, measuring 6.9  x 3.8 cm on image 64 of series 2.   06/16/2021 -  Chemotherapy   Patient is on Treatment Plan : COLORECTAL FOLFOX q14d x 6 months       ALLERGIES:  is allergic to chlorhexidine gluconate, shellfish allergy, and penicillins.  MEDICATIONS:  Current Outpatient Medications  Medication Sig Dispense Refill   gabapentin (NEURONTIN) 300 MG capsule Take 1 capsule (300 mg total) by mouth at bedtime. 30 capsule 0   oxyCODONE-acetaminophen (PERCOCET) 7.5-325 MG tablet Take 1 tablet by mouth every 6 (six) hours as needed for severe pain or moderate pain. 90 tablet 0   acetaminophen (TYLENOL) 325 MG tablet Take 1-2 tablets (325-650 mg total) by mouth every 4 (four) hours as needed for mild pain.     diclofenac Sodium (VOLTAREN) 1 % GEL Apply 4 g topically 4 (four) times daily as needed (for mild pain). (Patient not taking: Reported on 06/12/2021) 4 g 0   DULoxetine (CYMBALTA) 30 MG capsule Take 1 capsule (30 mg total) by mouth daily. 30 capsule 0   furosemide (LASIX) 20 MG tablet Take 1 tablet (20 mg total) by mouth daily. (Patient not taking: Reported on 06/12/2021) 30 tablet 0   methocarbamol 1000 MG TABS Take 1,000 mg by mouth 4 (four) times daily. 120 tablet 0   metoprolol tartrate (LOPRESSOR) 50 MG tablet Take 1 tablet (50 mg total) by mouth 2 (two) times daily. 60 tablet 0   ondansetron (ZOFRAN) 8 MG tablet Take 1 tablet (8 mg total) by mouth 2 (two) times daily as needed for refractory nausea / vomiting. Start on day 3 after chemotherapy. 30 tablet 1   pantoprazole (PROTONIX) 40 MG tablet Take 1 tablet (40 mg total) by mouth daily. (Patient not taking: Reported on 06/12/2021) 30 tablet 0   prochlorperazine (COMPAZINE) 10 MG tablet Take 1 tablet (10 mg total) by mouth every 6 (six) hours as needed (Nausea or vomiting). 30 tablet 1   No current facility-administered medications for this visit.    VITAL SIGNS: There were no vitals taken for this visit. There were no vitals filed for this visit.   Estimated body mass index is 34.05 kg/m as calculated from the following:   Height as of an earlier encounter on 06/12/21: _0  (1.6 m).   Weight as of an earlier encounter on 06/12/21: 192 lb 3.2 oz (87.2 kg).  LABS: CBC:    Component Value Date/Time   WBC 8.2 05/11/2021 0529   HGB 10.4 (L) 05/11/2021 0529   HGB 11.1 10/02/2019 1127   HCT 32.3 (L) 05/11/2021 0529   HCT 35.8 10/02/2019 1127   PLT 557 (H) 05/11/2021 0529   PLT 557 (H) 10/02/2019 1127   MCV 87.3 05/11/2021 0529   MCV 80 10/02/2019 1127   NEUTROABS 5.3 05/11/2021 0529   NEUTROABS 16.8 (H) 08/16/2018 1126   LYMPHSABS 2.0 05/11/2021 0529   LYMPHSABS 2.5 08/16/2018 1126   MONOABS 0.6 05/11/2021 0529   EOSABS 0.3 05/11/2021 0529  EOSABS 0.0 08/16/2018 1126   BASOSABS 0.0 05/11/2021 0529   BASOSABS 0.1 08/16/2018 1126   Comprehensive Metabolic Panel:    Component Value Date/Time   NA 139 05/15/2021 0521   NA 139 12/21/2019 1024   K 3.7 05/15/2021 0521   CL 99 05/15/2021 0521   CO2 28 05/15/2021 0521   BUN 9 05/15/2021 0521   BUN 13 12/21/2019 1024   CREATININE 0.57 05/15/2021 0521   GLUCOSE 91 05/15/2021 0521   CALCIUM 8.9 05/15/2021 0521   AST 16 05/14/2021 0513   ALT 11 05/14/2021 0513   ALKPHOS 35 (L) 05/14/2021 0513   BILITOT 0.6 05/14/2021 0513   BILITOT 0.2 07/11/2019 1638   PROT 6.6 05/14/2021 0513   PROT 7.4 07/11/2019 1638   ALBUMIN 2.6 (L) 05/14/2021 0513   ALBUMIN 4.6 07/11/2019 1638    RADIOGRAPHIC STUDIES: No results found.  PERFORMANCE STATUS (ECOG) : 1 - Symptomatic but completely ambulatory  Review of Systems  Gastrointestinal:  Positive for abdominal pain.  Musculoskeletal:  Positive for back pain.  Neurological:  Positive for weakness.  Unless otherwise noted, a complete review of systems is negative.  Physical Exam General: NAD, appears uncomfortable Cardiovascular: tachycardic Pulmonary: clear ant fields Abdomen: soft, tender, + bowel sounds, colostomy, midline incision  dressing Extremities: no edema, no joint deformities Skin: no rashes, dry, flaky Neurological: AAO x3  IMPRESSION:  This is my initial palliative visit with Mikayla Rios. Her husband, Roderick Pee is present with her for support.  I introduced myself, Advertising copywriter, and Palliative's role in collaboration with the oncology team. Concept of Palliative Care was introduced as specialized medical care for people and their families living with serious illness.  It focuses on providing relief from the symptoms and stress of a serious illness.  The goal is to improve quality of life for both the patient and the family. Values and goals of care important to patient and family were attempted to be elicited.   Mikayla Rios and her husband live in the home with their 3 children.  She is currently not working.  In the home she is ambulatory with occasional walker use for weakness.  States she is actively undergoing physical therapy.  Her appetite has improved since surgery.  Reports she is trying to get a better understanding of how her body will tolerate different foods with her new colostomy.  Reports she generally empties it 2-3 times per day.  1.  Pain Mrs. Daher reports ongoing uncontrolled abdominal pain and lower back pain.  Reports pain has been since recent surgery.  She was initially taking Percocet every 8 hours however surgeon increased to 1-2 every 6 hours due to significant pain.  She reports this was controlling her pain but is currently out and has not taken anything over the past 2 days.  States the pain has been uncontrolled and she has been miserable and crying on and off throughout the day and night as nothing will relieve her discomfort.  She is using ibuprofen and alternating between heat and ice.  States this provides minimal relief.  Is tearful expressing she does not like to present herself this way in front of her children and is used to being independent however this has not been the case over the past  several weeks.  We discussed at length her current pain with a goal of gaining better control while minimizing long-term opioid use.  Education also provided around risk of constipation in the setting of opioid use.  She verbalizes understanding expressing this has not been the case thus far but we will closely monitor and begin taking MiraLAX daily as recommended.  Patient is aware we will continue her on Percocet 7.5/325 mg as needed for moderate to severe pain.  She is to continue taking 400 mg of ibuprofen, alternating with ice and heat.  She reports Robaxin was effective the first couple of days however she no longer has any relief when taking.  Education provided on the use of gabapentin at bedtime with a goal of increased pain improvement.  2.  Anxiety Mikayla Rios endorses some anxiety with new diagnosis in addition to her uncontrolled pain.  Expressed focusing on deep breathing technique to decrease anxiety.  Encouraged her to minimize any additional home stressors is much as possible.  Patient and her husband are hopeful for aggressive treatment and interventions allow her every opportunity to continue to thrive and be there for her children.  She is tearful expressing hopes that her quality of life will be regained.  I discussed the importance of continued conversation with family and their medical providers regarding overall plan of care and treatment options, ensuring decisions are within the context of the patients values and GOCs.  PLAN: Oxycodone/APAP 7.5/325 every 6 hours as needed for moderate to severe pain.  Understands goal is not for long-term pain medication use with hope of improvement over the next several weeks. Gabapentin 300 mg every night for pain Patient will alternate with heat and ice therapy, capsaicin cream to lower back area.  Advised not to use on her abdominal area or around colostomy. Ibuprofen 400 mg every 6-8 hours for pain as needed Encouraged to slowly increase  activity to allow for rebuild of stamina and muscle use.  Emphasized listening to her body and taking rest breaks when fatigued or noticeable increase in pain.  Encouraged not to push herself too hard initially to prevent any further setbacks. I will plan to see patient back in 2-3 weeks in collaboration to other oncology appointments.    Patient expressed understanding and was in agreement with this plan. She also understands that She can call the clinic at any time with any questions, concerns, or complaints.     Time Total: 50 min  Visit consisted of counseling and education dealing with the complex and emotionally intense issues of symptom management and palliative care in the setting of serious and potentially life-threatening illness.Greater than 50%  of this time was spent counseling and coordinating care related to the above assessment and plan.  Signed by: Alda Lea, AGPCNP-BC Palliative Medicine Team

## 2021-06-12 NOTE — Progress Notes (Signed)
I spoke with Mikayla Rios and let her know I have rescheduled her PICC placement for 06/22/2021 at 0800.  I provided the number for [facility] [phone] for her ostomy supplies.  I also referred her to our ostomy clinic for ongoing support.  All questions were answered.  She verbalized understanding.

## 2021-06-12 NOTE — Progress Notes (Signed)
[place]   [phone]) [phone] [phone]) [phone]   Clinic Follow up Note   Patient Care Team: [name], MD as PCP - General (Internal Medicine) [name], MD as PCP - Cardiology (Cardiology) [name], [name] as Physician Assistant (Emergency Medicine) [name], MD as Consulting Physician (Oncology) [name], RN as Oncology Nurse Navigator (Oncology) [name], MD as Consulting Physician (General Surgery) [name], [name], MD as Consulting Physician (Neurology) [name], [name], MD as Consulting Physician (Obstetrics and Gynecology) [name], MD as Consulting Physician (Gastroenterology)  Date of Service:  06/12/2021  CHIEF COMPLAINT: f/u of sigmoid colon cancer  CURRENT THERAPY:  PENDING FOLFOX, q14d  ASSESSMENT & PLAN:  Mikayla Rios is a 36 y.o. female with   1. 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 She was found to have diverticulitis with perforation. She also developed an abscess requiring 2 drains. She continued to have abdominal pain and drainage, undergoing drain exchange three times. -she developed worsening weakness and was admitted on 04/10/21. She was taken for emergent small bowel and left colon resection on 04/15/21 under Dr. Johney Maine. Pathology from the procedure revealed moderately differentiated colonic adenocarcinoma extending into pericolonic adipose tissue and small bowel. Margins negative for invasive carcinoma, but low-grade dysplasia involves proximal margin. All 5 lymph nodes negative (0/5). MSI low. -CT scans were negative for distant metastasis  -I previously discussed the clinical trial ATOMIC with her. She is not eligible given her ECOG of 3 and having stage II cancer. -her wound is healing; she saw Dr. Johney Maine yesterday, 06/11/21. I would like to give her a bit longer before starting chemo. We  will plan to start the following week on 1/16  -Dr. Johney Maine recommended CT to rule out any concerns causing her pain. I ordered today.   2. Symptom Management: Abdominal pain, Weight loss -She was previously on oxycodone 10 mg every 4 hours.  I discussed narcotics dependence, and suggested her to gradually wean off. She is now on percocet 5-325 every 6 hours as needed and Cymbalta.  -she continues to have severe pain at the incision site. She saw Dr. Johney Maine yesterday, 06/11/21, who said the pain could last up to a year due to the complexity of her surgery. -we will refer her to our dietician.   3.  Anxiety and depression -Due to the prolonged illness and multiple hospital stay since May 2022, and cancer diagnosis, she has been feeling depressed lately. -cymbalta prescribed 05/25/21   4. 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 I discussed this result with them. 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 However her MSI was low, instead of high, which is unusual for Lynch syndrome. -We will refer her to genetics due to her young age and family history of colon cancer.  -genetic counseling and lab scheduled for 06/29/21   5. Social Support -she is connected with Education officer, [employer] -she is working to apply for disability. Her job ended just prior to the start of her symptoms.     PLAN: -picc line placement, lab, f/u and FOLFOX on 1/16  -CT AP to be done in next 10-14 days   No problem-specific Assessment & Plan notes found for this encounter.   SUMMARY OF ONCOLOGIC HISTORY: Oncology History  Malignant neoplasm of sigmoid colon [facility])   Initial Diagnosis   Malignant neoplasm of sigmoid  colon ([place])   11/04/2020 Imaging   EXAM: CT ABDOMEN AND PELVIS WITHOUT CONTRAST  IMPRESSION: 1. Perforating descending colonic diverticulitis with multiple abdominopelvic gas and fluid collections, measuring up to 5.7 cm and further  described above. 2. Cholelithiasis without findings of acute cholecystitis. 3. 3.6 cm benign left adrenal adenoma. 4. Leiomyomatous uterus. 5. 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   11/07/2020 Imaging   EXAM: CT ABDOMEN AND PELVIS WITHOUT CONTRAST  IMPRESSION: Continued wall thickening is seen involving descending colon suggesting infectious or inflammatory colitis or perforated diverticulitis. There is an adjacent fluid collection measuring 7.0 x 5.0 cm consistent with abscess which is significantly enlarged compared to prior exam. Adjacent to the abscess, there is a severely thickened small bowel loop most consistent with secondary inflammation.   5.2 x 3.5 cm fluid collection is noted within the left psoas muscle consistent with abscess which is significantly enlarged compared to prior exam. 4.4 x 3.8 cm fluid collection consistent with abscess is noted in the left retroperitoneal region which is also enlarged compared to prior exam.   Fibroid uterus.   Cholelithiasis.   11/27/2020 Imaging   EXAM: CT ABDOMEN AND PELVIS WITHOUT CONTRAST  IMPRESSION: 1. Significantly interval decreased size of the previously visualized left retroperitoneal and left anterior mid abdominal fluid collections. There is persistent fat stranding and mural thickening of the mid descending colon at this level. 2. Unchanged leiomyomatous uterus. 3. Unchanged cholelithiasis.   12/11/2020 Imaging   EXAM: CT ABDOMEN AND PELVIS WITHOUT CONTRAST  IMPRESSION: 1. Stable inflammatory changes centered around the distal descending colon in the left lower abdomen. Pericolonic inflammatory changes have minimally changed since 11/27/2020. Small pocket of gas medial to the colon is probably associated with a fistula or small residual abscess collection. 2. Stable position of the two percutaneous drains. The more  anterior drain may be extending through a portion of the small bowel. 3. Cholelithiasis. 4. Fibroid uterus. Cannot exclude an ovarian/adnexal cystic structure near the uterine fundus. 5. Left adrenal adenoma.   01/07/2021 Imaging   EXAM: CT ABDOMEN AND PELVIS WITH CONTRAST  IMPRESSION: 1. No new abscesses identified. Similar degree of soft tissue thickening seen in the left pericolonic region. The degree of persistent soft tissues thickening in the pericolonic space further raises suspicions for malignancy. Further evaluation with colonoscopy should be performed. 2. Left retroperitoneal abscess drain unchanged in position. Anterior left abdominal drain again seen terminating within small bowel loop. 3. 2.6 cm mildly sclerotic lesion noted in the L2 vertebral body. Further evaluation with contrast enhanced lumbar spine MRI should be performed.   02/04/2021 Imaging   EXAM: CT ABDOMEN AND PELVIS WITH CONTRAST  IMPRESSION: No new abdominopelvic collections or abscess development in the 1 month interval.   Left anterior drain remains within a loop of small bowel, unchanged.   Left lateral abscess drain remains in the retroperitoneal space adjacent to the iliopsoas muscle with a small amount of residual fluid and air but no measurable collection.   Stable soft tissue prominence and pericolonic strandy edema/inflammation about the left descending colon compatible with residual diverticulitis/colitis. Difficult to exclude underlying transmural lesion.   04/01/2021 Imaging   EXAM: CT ABDOMEN AND PELVIS WITH CONTRAST  IMPRESSION: There appears to be significant enhancement and wall thickening involving the descending colon with some degree of traction and involvement of adjacent small bowel loops. This is consistent with the history of diverticulitis and  perforation. Stable position of percutaneous drainage catheter is seen adjacent to left psoas muscle with no significant residual  fluid remaining. The other percutaneous drainage catheter that was previously noted to be within small bowel loop on prior exam in the left lower quadrant, has significantly retracted and appears to be outside of the peritoneal space at this time.   Since the prior exam, there does appear to be some degree of rotation involving mesenteric vessels and structures in the right lower quadrant, suggesting partial volvulus or malrotation. There is the interval development of several lymph nodes in this area, most likely inflammatory or reactive in etiology. Mild amount of free fluid is also noted in the pelvis. However, no significant bowel wall thickening or dilatation is seen in this area. These results will be called to the ordering clinician or representative by the Radiologist Assistant, and communication documented in the PACS or zVision Dashboard.   Stable uterine fibroid.   Hepatic steatosis.   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   Cholelithiasis.   04/10/2021 Imaging   EXAM: CT ANGIOGRAPHY CHEST CT ABDOMEN AND PELVIS WITH CONTRAST  IMPRESSION: 1. Again seen are findings compatible with descending colon diverticulitis with perforation. Free air and inflammation have increased in the interval. 2. New lobulated enhancing fluid collection posterior to this segment of inflamed colon measuring 8.5 x 3.5 x 10.0 cm. This collection now invades the adjacent iliopsoas muscle as well as extends through the left lateral abdominal wall. The tip of the drainage catheter is in this collection. Findings are compatible with abscess. 3. Anterior left percutaneous drainage catheter tip has been pulled back and is now within the subcutaneous tissues. 4. Trace free fluid. 5. No pulmonary embolism.  No acute cardiopulmonary process. 6. Cholelithiasis. 7. Fatty infiltration of the liver.   04/15/2021 Definitive Surgery   FINAL MICROSCOPIC DIAGNOSIS:   A. SMALL BOWEL, RESECTION:  -  Adenocarcinoma.  - No carcinoma identified in 1 lymph node.   B. PERFORATED LEFT COLON, RESECTION:  - Moderately differentiated colonic adenocarcinoma.  - Tumor extends into pericolonic adipose tissue, and is strongly  suggestive of invasion into small bowel.  See oncology table/comments.  - No carcinoma identified in 4 lymph nodes (0/4).  - Tubular adenoma with high-grade dysplasia, 1.  - Tubular adenomas with low grade dysplasia, 3.   Comments: The size of the tumor is difficult to estimate secondary to the disrupted nature of the specimen, as well as the infiltrative nature of the tumor.  Tumor can be identified as definitely invading into the pericolonic adipose tissue (block B4), but given the extreme disruption of the tissue, and the involvement of the small bowel by what appears to be a colonic adenocarcinoma, I favor perforation of the large bowel with direct invasion into the small bowel.  Accordingly, I believe this is best regarded as a pT4b lesion.   ADDENDUM:  Mismatch Repair Protein (IHC)  SUMMARY INTERPRETATION: ABNORMAL  There is loss of the major MMR protein [name]6: This indicates a high probability that a hereditary germline mutation is present and referral to genetic counseling is warranted. It is recommended that the loss of protein expression be correlated with molecular based microsatellite instability testing.   IHC EXPRESSION RESULTS  TEST           RESULT  MLH1:          Preserved nuclear expression  [name]2:          Preserved nuclear expression  [name]6:  LOSS OF NUCLEAR EXPRESSION  PMS2:          Preserved nuclear expression    04/15/2021 Cancer Staging   Staging form: Colon and Rectum, AJCC 8th Edition - Pathologic stage from 04/15/2021: Stage IIC (pT4b, pN0, cM0) - Signed by [name], MD on 06/12/2021 Stage prefix: Initial diagnosis Total positive nodes: 0 Histologic grading system: 4 grade system Histologic grade (G): G2 Residual tumor (R): R0 - None     04/29/2021 Imaging   EXAM: CT ABDOMEN AND PELVIS WITH CONTRAST  IMPRESSION: Continued left retroperitoneal abscesses inferior to the left kidney, involving the left psoas muscle and left abdominal wall musculature. Overall size is decreased since prior study. Interval removal of left lower quadrant abscess drainage catheter.   New fluid collection in the cul-de-sac and wrapping around the uterus concerning for abscess.   Cholelithiasis.   Small left pleural effusion.  Bibasilar atelectasis.   Stable left adrenal adenoma.  ADDENDUM: After discussing the case with Dr. Dwaine Gale in interventional radiology, it was noted that the fluid in the pelvis originally thought to be in the cul-de-sac is likely within the vagina, best seen on sagittal imaging. Recommend speculum exam.   Also, in the left lateral wall in the area of prior abscess in the lateral wall abdominal musculature, some of the abdomen soft tissue appears to be enhancing. While this could be infectious, cannot exclude tumor seeding in the left lateral abdominal wall, measuring 6.9 x 3.8 cm on image 64 of series 2.   06/16/2021 -  Chemotherapy   Patient is on Treatment Plan : COLORECTAL FOLFOX q14d x 6 months        INTERVAL HISTORY:  Mikayla Rios is here for a follow up of sigmoid colon cancer. She was last seen by me on 05/25/21. She presents to the clinic accompanied by her husband. She reports she is not feeling any better. She states her main complaint is the abdominal pain. She notes she saw Dr. Johney Maine yesterday and adds he is not concerned about her pain. She states he recommends a scan just to make sure.   All other systems were reviewed with the patient and are negative.  MEDICAL HISTORY:  Past Medical History:  Diagnosis Date   Allergy    seasonal   Anemia    Blood infection 1985   Blood transfusion without reported diagnosis    Diverticulitis    Hypertension    Obesity    Sleep apnea     SURGICAL  HISTORY: Past Surgical History:  Procedure Laterality Date   COLECTOMY WITH COLOSTOMY CREATION/HARTMANN PROCEDURE N/A 04/15/2021   Procedure: COLOSTOMY CREATION/HARTMANN PROCEDURE;  Surgeon: [name], MD;  Location: [facility];  Service: General;  Laterality: N/A;   IR CATHETER TUBE CHANGE  12/12/2020   IR CATHETER TUBE CHANGE  01/27/2021   IR CATHETER TUBE CHANGE  02/19/2021   IR CATHETER TUBE CHANGE  04/13/2021   IR RADIOLOGIST EVAL & MGMT  11/27/2020   IR RADIOLOGIST EVAL & MGMT  12/11/2020   IR RADIOLOGIST EVAL & MGMT  01/07/2021   IR RADIOLOGIST EVAL & MGMT  02/04/2021   IR SINUS/FIST TUBE CHK-NON GI  12/12/2020   IR SINUS/FIST TUBE CHK-NON GI  02/19/2021   LAPAROSCOPIC PARTIAL COLECTOMY N/A 04/15/2021   Procedure: LAPAROSCOPIC ASSISTED HARTMANN RESECTION;  Surgeon: [name], MD;  Location: [facility];  Service: General;  Laterality: N/A;    I have reviewed the social history and family history with the patient and  they are unchanged from previous note.  ALLERGIES:  is allergic to chlorhexidine gluconate, shellfish allergy, and penicillins.  MEDICATIONS:  Current Outpatient Medications  Medication Sig Dispense Refill   acetaminophen (TYLENOL) 325 MG tablet Take 1-2 tablets (325-650 mg total) by mouth every 4 (four) hours as needed for mild pain.     diclofenac Sodium (VOLTAREN) 1 % GEL Apply 4 g topically 4 (four) times daily as needed (for mild pain). (Patient not taking: Reported on 06/12/2021) 4 g 0   DULoxetine (CYMBALTA) 30 MG capsule Take 1 capsule (30 mg total) by mouth daily. 30 capsule 0   furosemide (LASIX) 20 MG tablet Take 1 tablet (20 mg total) by mouth daily. (Patient not taking: Reported on 06/12/2021) 30 tablet 0   gabapentin (NEURONTIN) 300 MG capsule Take 1 capsule (300 mg total) by mouth at bedtime. 30 capsule 0   methocarbamol 1000 MG TABS Take 1,000 mg by mouth 4 (four) times daily. 120 tablet 0   metoprolol tartrate (LOPRESSOR) 50 MG tablet Take 1 tablet (50 mg  total) by mouth 2 (two) times daily. 60 tablet 0   ondansetron (ZOFRAN) 8 MG tablet Take 1 tablet (8 mg total) by mouth 2 (two) times daily as needed for refractory nausea / vomiting. Start on day 3 after chemotherapy. 30 tablet 1   oxyCODONE-acetaminophen (PERCOCET) 7.5-325 MG tablet Take 1 tablet by mouth every 6 (six) hours as needed for severe pain or moderate pain. 90 tablet 0   pantoprazole (PROTONIX) 40 MG tablet Take 1 tablet (40 mg total) by mouth daily. (Patient not taking: Reported on 06/12/2021) 30 tablet 0   prochlorperazine (COMPAZINE) 10 MG tablet Take 1 tablet (10 mg total) by mouth every 6 (six) hours as needed (Nausea or vomiting). 30 tablet 1   No current facility-administered medications for this visit.    PHYSICAL EXAMINATION: ECOG PERFORMANCE STATUS: 2 - Symptomatic, <50% confined to bed  Vitals:   06/12/21 1233  BP: 139/89  Pulse: (!) 102  Resp: 18  Temp: 99 F (37.2 C)  SpO2: 100%   Wt Readings from Last 3 Encounters:  06/12/21 192 lb 3.2 oz (87.2 kg)  05/25/21 193 lb 4.8 oz (87.7 kg)  05/08/21 205 lb 7.5 oz (93.2 kg)     GENERAL:alert, no distress and comfortable SKIN: skin color normal, no rashes or significant lesions EYES: normal, Conjunctiva are pink and non-injected, sclera clear  NEURO: alert & oriented x 3 with fluent speech  LABORATORY DATA:  I have reviewed the data as listed CBC Latest Ref Rng & Units 05/11/2021 05/07/2021 05/04/2021  WBC 4.0 - 10.5 K/uL 8.2 9.5 9.5  Hemoglobin 12.0 - 15.0 g/dL 10.4(L) 9.9(L) 10.0(L)  Hematocrit 36.0 - 46.0 % 32.3(L) 30.3(L) 32.2(L)  Platelets 150 - 400 K/uL 557(H) 597(H) 701(H)     CMP Latest Ref Rng & Units 05/15/2021 05/14/2021 05/11/2021  Glucose 70 - 99 mg/dL 91 94 93  BUN 6 - 20 mg/dL 9 9 7   Creatinine 0.44 - 1.00 mg/dL 0.57 0.73 0.62  Sodium 135 - 145 mmol/L 139 135 137  Potassium 3.5 - 5.1 mmol/L 3.7 3.1(L) 3.5  Chloride 98 - 111 mmol/L 99 96(L) 97(L)  CO2 22 - 32 mmol/L 28 30 30   Calcium 8.9 -  10.3 mg/dL 8.9 8.9 8.9  Total Protein 6.5 - 8.1 g/dL - 6.6 6.5  Total Bilirubin 0.3 - 1.2 mg/dL - 0.6 0.5  Alkaline Phos 38 - 126 U/L - 35(L) 35(L)  AST 15 - 41  U/L - 16 14(L)  ALT 0 - 44 U/L - 11 9      RADIOGRAPHIC STUDIES: I have personally reviewed the radiological images as listed and agreed with the findings in the report. No results found.    Orders Placed This Encounter  Procedures   CT ABDOMEN PELVIS W CONTRAST    Standing Status:   Future    Standing Expiration Date:   06/12/2022    Order Specific Question:   If indicated for the ordered procedure, I authorize the administration of contrast media per Radiology protocol    Answer:   Yes    Order Specific Question:   Preferred imaging location?    Answer:   [facility]    Order Specific Question:   Release to patient    Answer:   Immediate    Order Specific Question:   Is Oral Contrast requested for this exam?    Answer:   Yes, Per Radiology protocol    Order Specific Question:   Is patient pregnant?    Answer:   No   IR Fluoro Guide CV Line Left    Indicate type of CVC ordering    Standing Status:   Future    Standing Expiration Date:   06/12/2022    Order Specific Question:   Reason for exam:    Answer:   chemo    Order Specific Question:   Is the patient pregnant?    Answer:   No    Order Specific Question:   Preferred Imaging Location?    Answer:   [facility]   All questions were answered. The patient knows to call the clinic with any problems, questions or concerns. No barriers to learning was detected. The total time spent in the appointment was 30 minutes.     [name], MD 06/12/2021   I, Wilburn Mylar, am acting as scribe for [name], MD.   I have reviewed the above documentation for accuracy and completeness, and I agree with the above.

## 2021-06-15 ENCOUNTER — Telehealth: Payer: Self-pay | Admitting: Hematology

## 2021-06-15 ENCOUNTER — Encounter: Payer: Self-pay | Admitting: Hematology

## 2021-06-15 ENCOUNTER — Encounter: Payer: BC Managed Care – PPO | Admitting: Registered Nurse

## 2021-06-15 DIAGNOSIS — Z483 Aftercare following surgery for neoplasm: Secondary | ICD-10-CM | POA: Diagnosis not present

## 2021-06-15 DIAGNOSIS — Z6837 Body mass index (BMI) 37.0-37.9, adult: Secondary | ICD-10-CM | POA: Diagnosis not present

## 2021-06-15 DIAGNOSIS — E669 Obesity, unspecified: Secondary | ICD-10-CM | POA: Diagnosis not present

## 2021-06-15 DIAGNOSIS — Z79891 Long term (current) use of opiate analgesic: Secondary | ICD-10-CM | POA: Diagnosis not present

## 2021-06-15 DIAGNOSIS — C189 Malignant neoplasm of colon, unspecified: Secondary | ICD-10-CM | POA: Diagnosis not present

## 2021-06-15 DIAGNOSIS — E440 Moderate protein-calorie malnutrition: Secondary | ICD-10-CM | POA: Diagnosis not present

## 2021-06-15 DIAGNOSIS — D62 Acute posthemorrhagic anemia: Secondary | ICD-10-CM | POA: Diagnosis not present

## 2021-06-15 DIAGNOSIS — Z4801 Encounter for change or removal of surgical wound dressing: Secondary | ICD-10-CM | POA: Diagnosis not present

## 2021-06-15 DIAGNOSIS — G4730 Sleep apnea, unspecified: Secondary | ICD-10-CM | POA: Diagnosis not present

## 2021-06-15 DIAGNOSIS — Z933 Colostomy status: Secondary | ICD-10-CM | POA: Diagnosis not present

## 2021-06-15 DIAGNOSIS — E876 Hypokalemia: Secondary | ICD-10-CM | POA: Diagnosis not present

## 2021-06-15 DIAGNOSIS — I10 Essential (primary) hypertension: Secondary | ICD-10-CM | POA: Diagnosis not present

## 2021-06-15 DIAGNOSIS — Z9049 Acquired absence of other specified parts of digestive tract: Secondary | ICD-10-CM | POA: Diagnosis not present

## 2021-06-15 DIAGNOSIS — K5792 Diverticulitis of intestine, part unspecified, without perforation or abscess without bleeding: Secondary | ICD-10-CM | POA: Diagnosis not present

## 2021-06-15 MED FILL — Dexamethasone Sodium Phosphate Inj 100 MG/10ML: INTRAMUSCULAR | Qty: 1 | Status: AC

## 2021-06-15 NOTE — Telephone Encounter (Signed)
Scheduled follow-up appointments per 1/6 los. Patient is aware.

## 2021-06-16 ENCOUNTER — Other Ambulatory Visit: Payer: Self-pay | Admitting: Hematology

## 2021-06-16 ENCOUNTER — Other Ambulatory Visit (HOSPITAL_COMMUNITY): Payer: Medicaid Other

## 2021-06-16 ENCOUNTER — Other Ambulatory Visit: Payer: Self-pay

## 2021-06-16 ENCOUNTER — Inpatient Hospital Stay: Payer: BC Managed Care – PPO

## 2021-06-16 ENCOUNTER — Telehealth: Payer: Self-pay

## 2021-06-16 ENCOUNTER — Ambulatory Visit (HOSPITAL_COMMUNITY)
Admission: RE | Admit: 2021-06-16 | Discharge: 2021-06-16 | Disposition: A | Discharge status: Home / Self Care | Payer: BC Managed Care – PPO | Source: Ambulatory Visit | Attending: Hematology | Admitting: Hematology

## 2021-06-16 DIAGNOSIS — E440 Moderate protein-calorie malnutrition: Secondary | ICD-10-CM | POA: Diagnosis not present

## 2021-06-16 DIAGNOSIS — C187 Malignant neoplasm of sigmoid colon: Secondary | ICD-10-CM

## 2021-06-16 DIAGNOSIS — Z4801 Encounter for change or removal of surgical wound dressing: Secondary | ICD-10-CM | POA: Diagnosis not present

## 2021-06-16 DIAGNOSIS — D492 Neoplasm of unspecified behavior of bone, soft tissue, and skin: Secondary | ICD-10-CM | POA: Diagnosis not present

## 2021-06-16 DIAGNOSIS — Z6837 Body mass index (BMI) 37.0-37.9, adult: Secondary | ICD-10-CM | POA: Diagnosis not present

## 2021-06-16 DIAGNOSIS — K5792 Diverticulitis of intestine, part unspecified, without perforation or abscess without bleeding: Secondary | ICD-10-CM | POA: Diagnosis not present

## 2021-06-16 DIAGNOSIS — E669 Obesity, unspecified: Secondary | ICD-10-CM | POA: Diagnosis not present

## 2021-06-16 DIAGNOSIS — D62 Acute posthemorrhagic anemia: Secondary | ICD-10-CM | POA: Diagnosis not present

## 2021-06-16 DIAGNOSIS — Z483 Aftercare following surgery for neoplasm: Secondary | ICD-10-CM | POA: Diagnosis not present

## 2021-06-16 DIAGNOSIS — I10 Essential (primary) hypertension: Secondary | ICD-10-CM | POA: Diagnosis not present

## 2021-06-16 DIAGNOSIS — Z9049 Acquired absence of other specified parts of digestive tract: Secondary | ICD-10-CM | POA: Diagnosis not present

## 2021-06-16 DIAGNOSIS — K8020 Calculus of gallbladder without cholecystitis without obstruction: Secondary | ICD-10-CM | POA: Diagnosis not present

## 2021-06-16 DIAGNOSIS — Z933 Colostomy status: Secondary | ICD-10-CM | POA: Diagnosis not present

## 2021-06-16 DIAGNOSIS — E876 Hypokalemia: Secondary | ICD-10-CM | POA: Diagnosis not present

## 2021-06-16 DIAGNOSIS — C189 Malignant neoplasm of colon, unspecified: Secondary | ICD-10-CM | POA: Diagnosis not present

## 2021-06-16 DIAGNOSIS — Z79891 Long term (current) use of opiate analgesic: Secondary | ICD-10-CM | POA: Diagnosis not present

## 2021-06-16 DIAGNOSIS — D259 Leiomyoma of uterus, unspecified: Secondary | ICD-10-CM | POA: Diagnosis not present

## 2021-06-16 DIAGNOSIS — G4730 Sleep apnea, unspecified: Secondary | ICD-10-CM | POA: Diagnosis not present

## 2021-06-16 LAB — POCT I-STAT CREATININE: Creatinine, Ser: 0.5 mg/dL (ref 0.44–1.00)

## 2021-06-16 MED ORDER — SODIUM CHLORIDE (PF) 0.9 % IJ SOLN
INTRAMUSCULAR | Status: AC
  Filled 2021-06-16: qty 50

## 2021-06-16 MED ORDER — IOHEXOL 350 MG/ML SOLN
80.0000 mL | Freq: Once | INTRAVENOUS | Status: AC | PRN
  Administered 2021-06-16: 80 mL via INTRAVENOUS

## 2021-06-16 NOTE — Telephone Encounter (Signed)
[name] called to inform our office that she is still experiencing pain in her abdomen and having a delay in pain relief. I informed [name], per Lexine Baton, NP, that she can take her Oxycodone every 4 hours and can take an additional Neurontin capsule in the morning.  I reminded [name] that this should be an acute pain situation and, if the pain continues, she would need to speak to her surgeon. She is having an abdominal CT today and I informed her we would watch for the results. I advised her to call with any other questions/concerns. All questions answered. Understanding verbalized.

## 2021-06-17 ENCOUNTER — Other Ambulatory Visit: Payer: Self-pay

## 2021-06-17 ENCOUNTER — Ambulatory Visit (INDEPENDENT_AMBULATORY_CARE_PROVIDER_SITE_OTHER): Payer: BC Managed Care – PPO | Admitting: Psychologist

## 2021-06-17 DIAGNOSIS — S31109A Unspecified open wound of abdominal wall, unspecified quadrant without penetration into peritoneal cavity, initial encounter: Secondary | ICD-10-CM | POA: Diagnosis not present

## 2021-06-17 DIAGNOSIS — Z933 Colostomy status: Secondary | ICD-10-CM | POA: Diagnosis not present

## 2021-06-17 DIAGNOSIS — F320 Major depressive disorder, single episode, mild: Secondary | ICD-10-CM

## 2021-06-17 DIAGNOSIS — Z4801 Encounter for change or removal of surgical wound dressing: Secondary | ICD-10-CM | POA: Diagnosis not present

## 2021-06-17 DIAGNOSIS — Z85038 Personal history of other malignant neoplasm of large intestine: Secondary | ICD-10-CM | POA: Diagnosis not present

## 2021-06-17 NOTE — Progress Notes (Signed)
[facility] Counselor Initial Adult Exam  Name: Mikayla Rios Date: 06/17/2021 MRN: [id number] DOB: [birth year] PCP: [name], MD  Time spent: 11:00 am to 11:34 am; total time: 34 minutes  This session was held via video webex teletherapy due to the coronavirus risk at this time. The patient consented to video teletherapy and was located at her home during this session. She is aware it is the responsibility of the patient to secure confidentiality on her end of the session. The provider was in a private home office for the duration of this session. Limits of confidentiality were discussed with the patient.   Guardian/Payee:  NA    Paperwork requested: No   Reason for Visit /Presenting Problem: Patient is seeking out counseling due to experiencing depressive symptoms secondary to experiencing stage 2 colon cancer.   Mental Status Exam: Appearance:   Well Groomed     Behavior:  Appropriate  Motor:  Normal  Speech/Language:   Normal Rate  Affect:  Appropriate  Mood:  normal  Thought process:  normal  Thought content:    WNL  Sensory/Perceptual disturbances:    WNL  Orientation:  oriented to person, place, and time/date  Attention:  Good  Concentration:  Good  Memory:  WNL  Fund of knowledge:   Good  Insight:    Good  Judgment:   Good  Impulse Control:  Good     Reported Symptoms:  The patient endorsed experiencing the following: feeling down, sad, tearful, rumination of negative thoughts, thoughts of hopelessness, fatigue, and fears of the unknown. She denied suicidal and homicidal ideation.   Risk Assessment: Danger to Self:  No Self-injurious Behavior: No Danger to Others: No Duty to Warn:no Physical Aggression / Violence:No  Access to Firearms a concern: No  Gang Involvement:No  Patient / guardian was educated about steps to take if suicide or homicide risk level increases between visits: n/a While future psychiatric events cannot be accurately  predicted, the patient does not currently require acute inpatient psychiatric care and does not currently meet [facility] involuntary commitment criteria.  Substance Abuse History: Current substance abuse: No     Past Psychiatric History:   No previous psychological problems have been observed Outpatient Providers:NA History of Psych Hospitalization: No  Psychological Testing:  NA    Abuse History:  Victim of: No.,  NA    Report needed: No. Victim of Neglect:No. Perpetrator of  NA   Witness / Exposure to Domestic Violence: No   Protective Services Involvement: No  Witness to [employer] Violence:  No   Family History:  Family History  Problem Relation Age of Onset   Cancer Mother        breast, and in remission   Diabetes Mother    Hypertension Mother    Colon cancer Mother        dx at age 92   Congestive Heart Failure Father    Hypertension Father    Heart disease Paternal Aunt    Cancer Paternal Uncle        colon   Diabetes Maternal Grandmother    Hypertension Maternal Grandmother    Diabetes Maternal Grandfather    Kidney disease Maternal Grandfather    Diabetes Paternal Grandmother    Cancer Paternal Grandfather        colon   Autism Son     Living situation: the patient lives with their family. The patient lives with her husband and three children.   Sexual Orientation: Straight  Relationship Status: married  Name of spouse / other:Errol If a parent, number of children / ages:Patient has three children. Patient has an 1  year old son who experiences autism spectrum disorder, a 87 year old daughter and an 33 year old son.   Support Systems: Patient described her husband, parents, and sister as making up her support system. Patient's mother is also experiencing cancer and patient's sister had colon cancer.   Financial Stress:  No   Income/Employment/Disability: Patient's spouse works. Patient is currently not working due to cancer.   Military Service:  No   Educational History: Education: college Brewing technologist: Patient stated that she identifies with a nondenominational church right now and indicated that her faith is very important to her. Per the patient, it has become more important following the cancer diagnosis.   Any cultural differences that may affect / interfere with treatment:  not applicable   Recreation/Hobbies: Spending time with family  Stressors: Other: Adjusting to the life changes she is having to make since being diagnosed with cancer.     Strengths: Supportive Relationships and Family  Barriers:  NA   Legal History: Pending legal issue / charges: The patient has no significant history of legal issues. History of legal issue / charges:  NA  Medical History/Surgical History: reviewed Past Medical History:  Diagnosis Date   Allergy    seasonal   Anemia    Blood infection 1985   Blood transfusion without reported diagnosis    Diverticulitis    Hypertension    Obesity    Sleep apnea     Past Surgical History:  Procedure Laterality Date   COLECTOMY WITH COLOSTOMY CREATION/HARTMANN PROCEDURE N/A 04/15/2021   Procedure: COLOSTOMY CREATION/HARTMANN PROCEDURE;  Surgeon: [name], MD;  Location: [facility];  Service: General;  Laterality: N/A;   IR CATHETER TUBE CHANGE  12/12/2020   IR CATHETER TUBE CHANGE  01/27/2021   IR CATHETER TUBE CHANGE  02/19/2021   IR CATHETER TUBE CHANGE  04/13/2021   IR RADIOLOGIST EVAL & MGMT  11/27/2020   IR RADIOLOGIST EVAL & MGMT  12/11/2020   IR RADIOLOGIST EVAL & MGMT  01/07/2021   IR RADIOLOGIST EVAL & MGMT  02/04/2021   IR SINUS/FIST TUBE CHK-NON GI  12/12/2020   IR SINUS/FIST TUBE CHK-NON GI  02/19/2021   LAPAROSCOPIC PARTIAL COLECTOMY N/A 04/15/2021   Procedure: LAPAROSCOPIC ASSISTED HARTMANN RESECTION;  Surgeon: [name], MD;  Location: [facility];  Service: General;  Laterality: N/A;    Medications: Current Outpatient Medications   Medication Sig Dispense Refill   acetaminophen (TYLENOL) 325 MG tablet Take 1-2 tablets (325-650 mg total) by mouth every 4 (four) hours as needed for mild pain.     amLODipine (NORVASC) 10 MG tablet Take 10 mg by mouth daily.     diclofenac Sodium (VOLTAREN) 1 % GEL Apply 4 g topically 4 (four) times daily as needed (for mild pain). (Patient not taking: Reported on 06/12/2021) 4 g 0   DULoxetine (CYMBALTA) 30 MG capsule TAKE 1 CAPSULE BY MOUTH EVERY DAY 90 capsule 1   furosemide (LASIX) 20 MG tablet Take 1 tablet (20 mg total) by mouth daily. (Patient not taking: Reported on 06/12/2021) 30 tablet 0   gabapentin (NEURONTIN) 300 MG capsule Take 1 capsule (300 mg total) by mouth at bedtime. 30 capsule 0   methocarbamol (ROBAXIN) 500 MG tablet SMARTSIG:1000 Milligram(s) By Mouth 4 Times Daily     methocarbamol 1000 MG TABS Take 1,000 mg by  mouth 4 (four) times daily. 120 tablet 0   metoprolol tartrate (LOPRESSOR) 50 MG tablet Take 1 tablet (50 mg total) by mouth 2 (two) times daily. 60 tablet 0   ondansetron (ZOFRAN) 8 MG tablet Take 1 tablet (8 mg total) by mouth 2 (two) times daily as needed for refractory nausea / vomiting. Start on day 3 after chemotherapy. 30 tablet 1   oxyCODONE-acetaminophen (PERCOCET) 7.5-325 MG tablet Take 1 tablet by mouth every 6 (six) hours as needed for severe pain or moderate pain. 90 tablet 0   pantoprazole (PROTONIX) 40 MG tablet Take 1 tablet (40 mg total) by mouth daily. (Patient not taking: Reported on 06/12/2021) 30 tablet 0   prochlorperazine (COMPAZINE) 10 MG tablet Take 1 tablet (10 mg total) by mouth every 6 (six) hours as needed (Nausea or vomiting). 30 tablet 1   No current facility-administered medications for this visit.    Allergies  Allergen Reactions   Chlorhexidine Gluconate Hives and Itching    CHG wipes cause itching and hives requiring benadryl   Shellfish Allergy Hives   Penicillins Hives and Rash    Has patient had a PCN reaction causing  immediate rash, facial/tongue/throat swelling, SOB or lightheadedness with hypotension: No Has patient had a PCN reaction causing severe rash involving mucus membranes or skin necrosis: hives Has patient had a PCN reaction that required hospitalization No Has patient had a PCN reaction occurring within the last 10 years: yes If all of the above answers are "NO", then may proceed with Cephalosporin use.     Diagnoses:  F32.0 major depressive affective disorder, single episode mild  Plan of Care: The patient is a 36 year old African American woman who was referred due to experiencing depressive symptoms secondary to experiencing stage 2 colon cancer. The patient lives at home with her husband and three children. The patient meets criteria for a diagnosis of F32.0 major depressive affective disorder, single episode, mild based off of the following: feeling down, sad, tearful, rumination of negative thoughts, thoughts of hopelessness, fatigue, and fears of the unknown. She denied suicidal and homicidal ideation.   The patient stated that she wanted coping skills. She also stated that she wants to process her emotions, fears, and adjustment to a new phase of life.  This psychologist makes the recommendation that the patient participate in at least bi-weekly therapy to assist her in meeting her goals.    Conception Chancy, PsyD

## 2021-06-17 NOTE — Plan of Care (Signed)
Goals Accept emotional support from others around them Live life to the fullest, event though time may be limited Become as knowledgeable about the medical condition  Reduce fear, anxiety about the health condition  Accept the illness Accept the role of psychological and behavioral factors  Stabilize anxiety level wile increasing ability to function Learn and implement coping skills that result in a reduction of anxiety  Alleviate depressive symptoms Recognize, accept, and cope with depressive feelings Develop healthy thinking patterns Develop healthy interpersonal relationships  Objectives Identify feelings associated with the illness Family members share with each other feelings Identify the losses or limitations that have been experienced Verbalize acceptance of the reality of the medical condition Commit to learning and implement a proactive approach to managing personal stresses Verbalize an understanding of the medical condition Work with therapist to develop a plan for coping with stress Learn and implement skills for managing stress Engage in social, productive activities that are possible Engage in faith based activities implement positive imagery Identify coping skills and sources of emotional support Patient's partner and family members verbalize their fears regarding severity of health condition Identify sources of emotional distress  Learning and implement calming skills to reduce overall anxiety Learn and implement problem solving strategies Identify and engage in pleasant activities Learning and implement personal and interpersonal skills to reduce anxiety and improve interpersonal relationships Learn to accept limitations in life and commit to tolerating, rather than avoiding, unpleasant emotions while accomplishing meaningful goals Identify major life conflicts from the past and present that form the basis for present anxiety Learn and implement behavioral  strategies Verbalize an understanding and resolution of current interpersonal problems Learn and implement problem solving and decision making skills Learn and implement conflict resolution skills to resolve interpersonal problems Verbalize an understanding of healthy and unhealthy emotions verbalize insight into how past relationships may be influence current experiences with depression Use mindfulness and acceptance strategies and increase value based behavior  Increase hopeful statements about the future.   Interventions Teach about stress and ways to handle stress Assist the patient in developing a coping action plan for stressors Conduct skills based training for coping strategies Train problem focused skills Sort out what activities the individual can do Encourage patient to rely upon his/her spiritual faith Teach the patient to use guided imagery Probe and evaluate family's ability to provide emotional support Allow family to share their fears Assist the patient in identifying, sorting through, and verbalizing the various feelings generated by his/her medical condition Meet with family members  Ask patient list out limitations  Use stress inoculation training  Use Acceptance and Commitment Therapy to help client accept uncomfortable realities in order to accomplish value-consistent goals Reinforce the client's insight into the role of his/her past emotional pain and present anxiety  Discuss examples demonstrating that unrealistic worry overestimates the probability of threats and underestimate patient's ability  Assist the patient in analyzing his or her worries Help patient understand that avoidance is reinforcing  Behavioral activation help the client explore the relationship, nature of the dispute,  Help the client develop new interpersonal skills and relationships Conduct Problem so living therapy Teach conflict resolution skills Use a process-experiential approach Conduct  TLDP Conduct ACT   Problem: Depression CCP Problem  1 alleviate distress from depressive symptoms.  Goal: STG: Dariel WILL PARTICIPATE IN AT LEAST 80% OF SCHEDULED INDIVIDUAL PSYCHOTHERAPY SESSIONS Outcome: Not Progressing Goal: STG: Ulah WILL COMPLETE AT LEAST 80% OF ASSIGNED HOMEWORK Outcome: Not Progressing

## 2021-06-17 NOTE — Progress Notes (Signed)
Wound care is being managed by Dr Johney Maine.  Dr Burr Medico received orders from [facility] for sound care supplies.  I called [employer] and spoke with [place].  He will forward the wound care supply order to Dr Clyda Greener office.

## 2021-06-18 ENCOUNTER — Inpatient Hospital Stay: Payer: BC Managed Care – PPO

## 2021-06-18 ENCOUNTER — Encounter: Payer: Self-pay | Admitting: Internal Medicine

## 2021-06-18 DIAGNOSIS — Z4801 Encounter for change or removal of surgical wound dressing: Secondary | ICD-10-CM | POA: Diagnosis not present

## 2021-06-18 DIAGNOSIS — D62 Acute posthemorrhagic anemia: Secondary | ICD-10-CM | POA: Diagnosis not present

## 2021-06-18 DIAGNOSIS — E669 Obesity, unspecified: Secondary | ICD-10-CM | POA: Diagnosis not present

## 2021-06-18 DIAGNOSIS — Z6837 Body mass index (BMI) 37.0-37.9, adult: Secondary | ICD-10-CM | POA: Diagnosis not present

## 2021-06-18 DIAGNOSIS — Z483 Aftercare following surgery for neoplasm: Secondary | ICD-10-CM | POA: Diagnosis not present

## 2021-06-18 DIAGNOSIS — K5792 Diverticulitis of intestine, part unspecified, without perforation or abscess without bleeding: Secondary | ICD-10-CM | POA: Diagnosis not present

## 2021-06-18 DIAGNOSIS — I10 Essential (primary) hypertension: Secondary | ICD-10-CM | POA: Diagnosis not present

## 2021-06-18 DIAGNOSIS — G4730 Sleep apnea, unspecified: Secondary | ICD-10-CM | POA: Diagnosis not present

## 2021-06-18 DIAGNOSIS — E440 Moderate protein-calorie malnutrition: Secondary | ICD-10-CM | POA: Diagnosis not present

## 2021-06-18 DIAGNOSIS — E876 Hypokalemia: Secondary | ICD-10-CM | POA: Diagnosis not present

## 2021-06-18 DIAGNOSIS — Z933 Colostomy status: Secondary | ICD-10-CM | POA: Diagnosis not present

## 2021-06-18 DIAGNOSIS — Z9049 Acquired absence of other specified parts of digestive tract: Secondary | ICD-10-CM | POA: Diagnosis not present

## 2021-06-18 DIAGNOSIS — Z79891 Long term (current) use of opiate analgesic: Secondary | ICD-10-CM | POA: Diagnosis not present

## 2021-06-18 DIAGNOSIS — C189 Malignant neoplasm of colon, unspecified: Secondary | ICD-10-CM | POA: Diagnosis not present

## 2021-06-19 ENCOUNTER — Other Ambulatory Visit: Payer: Self-pay

## 2021-06-19 ENCOUNTER — Encounter: Payer: Self-pay | Admitting: General Practice

## 2021-06-19 ENCOUNTER — Inpatient Hospital Stay (HOSPITAL_BASED_OUTPATIENT_CLINIC_OR_DEPARTMENT_OTHER): Payer: BC Managed Care – PPO | Admitting: Hematology

## 2021-06-19 ENCOUNTER — Encounter: Payer: Self-pay | Admitting: Internal Medicine

## 2021-06-19 ENCOUNTER — Ambulatory Visit (HOSPITAL_COMMUNITY)
Admission: RE | Admit: 2021-06-19 | Discharge: 2021-06-19 | Disposition: A | Discharge status: Home / Self Care | Payer: BC Managed Care – PPO | Source: Ambulatory Visit | Attending: Nurse Practitioner | Admitting: Nurse Practitioner

## 2021-06-19 VITALS — BP 144/97 | HR 111 | Temp 99.0°F | Resp 18 | Wt 201.1 lb

## 2021-06-19 DIAGNOSIS — K529 Noninfective gastroenteritis and colitis, unspecified: Secondary | ICD-10-CM | POA: Diagnosis not present

## 2021-06-19 DIAGNOSIS — D259 Leiomyoma of uterus, unspecified: Secondary | ICD-10-CM | POA: Diagnosis not present

## 2021-06-19 DIAGNOSIS — Z483 Aftercare following surgery for neoplasm: Secondary | ICD-10-CM | POA: Diagnosis not present

## 2021-06-19 DIAGNOSIS — L259 Unspecified contact dermatitis, unspecified cause: Secondary | ICD-10-CM | POA: Diagnosis not present

## 2021-06-19 DIAGNOSIS — K5792 Diverticulitis of intestine, part unspecified, without perforation or abscess without bleeding: Secondary | ICD-10-CM | POA: Diagnosis not present

## 2021-06-19 DIAGNOSIS — J90 Pleural effusion, not elsewhere classified: Secondary | ICD-10-CM | POA: Diagnosis not present

## 2021-06-19 DIAGNOSIS — E669 Obesity, unspecified: Secondary | ICD-10-CM | POA: Diagnosis not present

## 2021-06-19 DIAGNOSIS — Z9049 Acquired absence of other specified parts of digestive tract: Secondary | ICD-10-CM | POA: Diagnosis not present

## 2021-06-19 DIAGNOSIS — K9400 Colostomy complication, unspecified: Secondary | ICD-10-CM | POA: Diagnosis not present

## 2021-06-19 DIAGNOSIS — Z433 Encounter for attention to colostomy: Secondary | ICD-10-CM | POA: Diagnosis not present

## 2021-06-19 DIAGNOSIS — Z933 Colostomy status: Secondary | ICD-10-CM | POA: Diagnosis not present

## 2021-06-19 DIAGNOSIS — E876 Hypokalemia: Secondary | ICD-10-CM | POA: Diagnosis not present

## 2021-06-19 DIAGNOSIS — D3502 Benign neoplasm of left adrenal gland: Secondary | ICD-10-CM | POA: Diagnosis not present

## 2021-06-19 DIAGNOSIS — K8020 Calculus of gallbladder without cholecystitis without obstruction: Secondary | ICD-10-CM | POA: Diagnosis not present

## 2021-06-19 DIAGNOSIS — C187 Malignant neoplasm of sigmoid colon: Secondary | ICD-10-CM

## 2021-06-19 DIAGNOSIS — Z6837 Body mass index (BMI) 37.0-37.9, adult: Secondary | ICD-10-CM | POA: Diagnosis not present

## 2021-06-19 DIAGNOSIS — L24B1 Irritant contact dermatitis related to digestive stoma or fistula: Secondary | ICD-10-CM | POA: Diagnosis not present

## 2021-06-19 DIAGNOSIS — G893 Neoplasm related pain (acute) (chronic): Secondary | ICD-10-CM | POA: Diagnosis not present

## 2021-06-19 DIAGNOSIS — C189 Malignant neoplasm of colon, unspecified: Secondary | ICD-10-CM | POA: Diagnosis not present

## 2021-06-19 DIAGNOSIS — G4730 Sleep apnea, unspecified: Secondary | ICD-10-CM | POA: Diagnosis not present

## 2021-06-19 DIAGNOSIS — R109 Unspecified abdominal pain: Secondary | ICD-10-CM | POA: Diagnosis not present

## 2021-06-19 DIAGNOSIS — Z5111 Encounter for antineoplastic chemotherapy: Secondary | ICD-10-CM | POA: Diagnosis not present

## 2021-06-19 DIAGNOSIS — D62 Acute posthemorrhagic anemia: Secondary | ICD-10-CM | POA: Diagnosis not present

## 2021-06-19 DIAGNOSIS — Z800 Family history of malignant neoplasm of digestive organs: Secondary | ICD-10-CM | POA: Diagnosis not present

## 2021-06-19 DIAGNOSIS — Z79899 Other long term (current) drug therapy: Secondary | ICD-10-CM | POA: Diagnosis not present

## 2021-06-19 DIAGNOSIS — R634 Abnormal weight loss: Secondary | ICD-10-CM | POA: Diagnosis not present

## 2021-06-19 DIAGNOSIS — Z4801 Encounter for change or removal of surgical wound dressing: Secondary | ICD-10-CM | POA: Diagnosis not present

## 2021-06-19 DIAGNOSIS — Z79891 Long term (current) use of opiate analgesic: Secondary | ICD-10-CM | POA: Diagnosis not present

## 2021-06-19 DIAGNOSIS — F418 Other specified anxiety disorders: Secondary | ICD-10-CM | POA: Diagnosis not present

## 2021-06-19 DIAGNOSIS — I10 Essential (primary) hypertension: Secondary | ICD-10-CM | POA: Diagnosis not present

## 2021-06-19 DIAGNOSIS — E440 Moderate protein-calorie malnutrition: Secondary | ICD-10-CM | POA: Diagnosis not present

## 2021-06-19 MED ORDER — NITROFURANTOIN MONOHYD MACRO 100 MG PO CAPS: 100.0000 mg | ORAL_CAPSULE | Freq: Two times a day (BID) | ORAL | 0 refills | Status: AC

## 2021-06-19 NOTE — Progress Notes (Signed)
[facility]   Reason for visit:  LUQ colostomy HPI:  Malignant neoplasm with colostomy formation ROS  Review of Systems  Gastrointestinal:        Midline abdominal surgical wound, healing LUQ colostomy with creasing at 3 and 9 o'clock  Skin:  Positive for wound.       Peristomal breakdown from exposure to effluent  Psychiatric/Behavioral: Negative.    All other systems reviewed and are negative. Vital signs:  BP 128/81    Pulse (!) 110    Temp 99.7 F (37.6 C) (Oral)    Resp 18    SpO2 98%  Exam:  Physical Exam Vitals reviewed.  Constitutional:      Appearance: She is obese.  Abdominal:     Palpations: Abdomen is soft.     Comments: Creasing at 3 and 9 o'clock  Skin:    General: Skin is warm and dry.  Neurological:     General: No focal deficit present.     Mental Status: She is alert and oriented to person, place, and time.  Psychiatric:        Mood and Affect: Mood normal.        Behavior: Behavior normal.    Stoma type/location:  LUQ colostomy Stomal assessment/size:  1 1/4" pink and moist, producing soft brown stool Peristomal assessment:  creasing at 3 and 9 o'clock, denuded skin at 6 o'clock.  Os points down toward 6:00  Treatment options for stomal/peristomal skin: Today, will implement 2 piece convex pouch with barrier ring and belt.  Creasing and rounded abdomen as well as midline surgical wound are contributing to pouch leaks.  Spouse at bedside and assists with care as well.  Output: soft brown stool Ostomy pouching: 1pc convex with barrier ring and ostomy belt.   Education provided:  Performed pouch change and fitted into new pouch system.  I have flagged new products in [employer] catalog.  They will work with [facility] nurse to order if they like the new pouches.     Impression/dx  Contact dermatitis Discussion  New convex pouch Ostomy belt Plan  See back in 2 weeks.     Visit time: 55 minutes.   Domenic Moras FNP-BC

## 2021-06-19 NOTE — Progress Notes (Signed)
Mikayla Rios   Telephone:([phone]) [phone] Fax:([phone]) [phone]   Clinic Follow up Note   Patient Care Team: [name], MD as PCP - General (Internal Medicine) [name], MD as PCP - Cardiology (Cardiology) [name], [name] as Physician Assistant (Emergency Medicine) [name], MD as Consulting Physician (Oncology) [name], RN as Oncology Nurse Navigator (Oncology) [name], MD as Consulting Physician (General Surgery) [name], [name], MD as Consulting Physician (Neurology) [name], [name], MD as Consulting Physician (Obstetrics and Gynecology) [name], MD as Consulting Physician (Gastroenterology) [name], [name], NP as Nurse Practitioner (Nurse Practitioner)  Date of Service:  06/20/2021  CHIEF COMPLAINT: f/u of sigmoid colon cancer  CURRENT THERAPY:  To start FOLFOX 06/22/21  ASSESSMENT & PLAN:  Mikayla Rios is a 36 y.o. female with   1. 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 She was found to have diverticulitis with perforation. She also developed an abscess requiring 2 drains. She continued to have abdominal pain and drainage, undergoing drain exchange three times. -she developed worsening weakness and was admitted on 04/10/21. She was taken for emergent small bowel and left colon resection on 04/15/21 under Dr. Johney Maine. 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 Margins negative for invasive carcinoma, but low-grade dysplasia involves proximal margin. All 5 lymph nodes negative (0/5). MSI low. -repeat CT AP on 06/16/21 showed progressive metastatic disease involving soft tissue masses in left psoas muscle and left lateral abdominal wall and peritoneal masses, and new mild retroperitoneal lymphadenopathy. I reviewed the images and  discussed the results with them today. -I explained that her scan is concerning for metastatic disease. I recommend obtaining biopsy of the left abdominal wall mass to confirm cancer. If this is confirmed, this would mean her cancer is stage IV and is no longer curable. It is, however, still treatable. I discussed that this means she will be on treatment for the rest of her life. -This news was, of course, very overwhelming; she became very tearful and broke down emotionally after our discussion. I called social worker Lelon Frohlich to come talk to her. -she is scheduled to begin FOLFOX on 06/22/21. Will proceed. If biopsy confirm Lynch related cancer recurrence, I would switch her to [facility]    2. Symptom Management: Abdominal pain, Weight loss -she continues to have severe pain at the incision site. She saw Dr. Johney Maine on 06/11/21, who said the pain could last up to a year due to the complexity of her surgery. -her pain is now being managed by palliative care NP Nikki   3.  Anxiety and depression -Due to the prolonged illness and multiple hospital stay since May 2022, and cancer diagnosis, she has been feeling depressed lately. -cymbalta prescribed 05/25/21   4. 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 However her MSI was low, instead of high, which is unusual for Lynch syndrome. -We will refer her to genetics due to her young age and family history of colon cancer.  -genetic counseling and lab scheduled for 06/29/21   5. Social Support -she is connected with Education officer, [employer] -she is working to apply for disability. Her job ended just prior to the start of her symptoms. -she has three children-- ages 22, 47, and 44.     PLAN: -I reviewed her  recent CT findings which is highly concerning for peritoneal recurrence  -she is scheduled for picc line placement, lab, f/u and FOLFOX on 1/16, will  proceed -f/u after biopsy or 1/30    No problem-specific Assessment & Plan notes found for this encounter.   SUMMARY OF ONCOLOGIC HISTORY: Oncology History  Malignant neoplasm of sigmoid colon [facility])   Initial Diagnosis   Malignant neoplasm of sigmoid colon ([place])   11/04/2020 Imaging   EXAM: CT ABDOMEN AND PELVIS WITHOUT CONTRAST  IMPRESSION: 1. Perforating descending colonic diverticulitis with multiple abdominopelvic gas and fluid collections, measuring up to 5.7 cm and further described above. 2. Cholelithiasis without findings of acute cholecystitis. 3. 3.6 cm benign left adrenal adenoma. 4. Leiomyomatous uterus. 5. 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   11/07/2020 Imaging   EXAM: CT ABDOMEN AND PELVIS WITHOUT CONTRAST  IMPRESSION: Continued wall thickening is seen involving descending colon suggesting infectious or inflammatory colitis or perforated diverticulitis. There is an adjacent fluid collection measuring 7.0 x 5.0 cm consistent with abscess which is significantly enlarged compared to prior exam. Adjacent to the abscess, there is a severely thickened small bowel loop most consistent with secondary inflammation.   5.2 x 3.5 cm fluid collection is noted within the left psoas muscle consistent with abscess which is significantly enlarged compared to prior exam. 4.4 x 3.8 cm fluid collection consistent with abscess is noted in the left retroperitoneal region which is also enlarged compared to prior exam.   Fibroid uterus.   Cholelithiasis.   11/27/2020 Imaging   EXAM: CT ABDOMEN AND PELVIS WITHOUT CONTRAST  IMPRESSION: 1. Significantly interval decreased size of the previously visualized left retroperitoneal and left anterior mid abdominal fluid collections. There is persistent fat stranding and mural thickening of the mid descending colon at this level. 2.  Unchanged leiomyomatous uterus. 3. Unchanged cholelithiasis.   12/11/2020 Imaging   EXAM: CT ABDOMEN AND PELVIS WITHOUT CONTRAST  IMPRESSION: 1. Stable inflammatory changes centered around the distal descending colon in the left lower abdomen. Pericolonic inflammatory changes have minimally changed since 11/27/2020. Small pocket of gas medial to the colon is probably associated with a fistula or small residual abscess collection. 2. Stable position of the two percutaneous drains. The more anterior drain may be extending through a portion of the small bowel. 3. Cholelithiasis. 4. Fibroid uterus. Cannot exclude an ovarian/adnexal cystic structure near the uterine fundus. 5. Left adrenal adenoma.   01/07/2021 Imaging   EXAM: CT ABDOMEN AND PELVIS WITH CONTRAST  IMPRESSION: 1. No new abscesses identified. Similar degree of soft tissue thickening seen in the left pericolonic region. The degree of persistent soft tissues thickening in the pericolonic space further raises suspicions for malignancy. Further evaluation with colonoscopy should be performed. 2. Left retroperitoneal abscess drain unchanged in position. Anterior left abdominal drain again seen terminating within small bowel loop. 3. 2.6 cm mildly sclerotic lesion noted in the L2 vertebral body. Further evaluation with contrast enhanced lumbar spine MRI should be performed.   02/04/2021 Imaging   EXAM: CT ABDOMEN AND PELVIS WITH CONTRAST  IMPRESSION: No new abdominopelvic collections or abscess development in the 1 month interval.   Left anterior drain remains within a loop of small bowel, unchanged.   Left lateral abscess drain remains in the retroperitoneal space adjacent to the iliopsoas muscle with a small amount of residual fluid and air but no measurable collection.   Stable soft tissue  prominence and pericolonic strandy edema/inflammation about the left descending colon compatible with residual  diverticulitis/colitis. Difficult to exclude underlying transmural lesion.   04/01/2021 Imaging   EXAM: CT ABDOMEN AND PELVIS WITH CONTRAST  IMPRESSION: There appears to be significant enhancement and wall thickening involving the descending colon with some degree of traction and involvement of adjacent small bowel loops. This is consistent with the history of diverticulitis and perforation. Stable position of percutaneous drainage catheter is seen adjacent to left psoas muscle with no significant residual fluid remaining. The other percutaneous drainage catheter that was previously noted to be within small bowel loop on prior exam in the left lower quadrant, has significantly retracted and appears to be outside of the peritoneal space at this time.   Since the prior exam, there does appear to be some degree of rotation involving mesenteric vessels and structures in the right lower quadrant, suggesting partial volvulus or malrotation. There is the interval development of several lymph nodes in this area, most likely inflammatory or reactive in etiology. Mild amount of free fluid is also noted in the pelvis. However, no significant bowel wall thickening or dilatation is seen in this area. These results will be called to the ordering clinician or representative by the Radiologist Assistant, and communication documented in the PACS or zVision Dashboard.   Stable uterine fibroid.   Hepatic steatosis.   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   Cholelithiasis.   04/10/2021 Imaging   EXAM: CT ANGIOGRAPHY CHEST CT ABDOMEN AND PELVIS WITH CONTRAST  IMPRESSION: 1. Again seen are findings compatible with descending colon diverticulitis with perforation. Free air and inflammation have increased in the interval. 2. New lobulated enhancing fluid collection posterior to this segment of inflamed colon measuring 8.5 x 3.5 x 10.0 cm. This collection now invades the adjacent iliopsoas muscle as  well as extends through the left lateral abdominal wall. The tip of the drainage catheter is in this collection. Findings are compatible with abscess. 3. Anterior left percutaneous drainage catheter tip has been pulled back and is now within the subcutaneous tissues. 4. Trace free fluid. 5. No pulmonary embolism.  No acute cardiopulmonary process. 6. Cholelithiasis. 7. Fatty infiltration of the liver.   04/15/2021 Definitive Surgery   FINAL MICROSCOPIC DIAGNOSIS:   A. SMALL BOWEL, RESECTION:  - Adenocarcinoma.  - No carcinoma identified in 1 lymph node.   B. PERFORATED LEFT COLON, RESECTION:  - Moderately differentiated colonic adenocarcinoma.  - Tumor extends into pericolonic adipose tissue, and is strongly  suggestive of invasion into small bowel.  See oncology table/comments.  - No carcinoma identified in 4 lymph nodes (0/4).  - Tubular adenoma with high-grade dysplasia, 1.  - Tubular adenomas with low grade dysplasia, 3.   Comments: The size of the tumor is difficult to estimate secondary to the disrupted nature of the specimen, as well as the infiltrative nature of the tumor.  Tumor can be identified as definitely invading into the pericolonic adipose tissue (block B4), but given the extreme disruption of the tissue, and the involvement of the small bowel by what appears to be a colonic adenocarcinoma, I favor perforation of the large bowel with direct invasion into the small bowel.  Accordingly, I believe this is best regarded as a pT4b lesion.   ADDENDUM:  Mismatch Repair Protein (IHC)  SUMMARY INTERPRETATION: ABNORMAL  There is loss of the major MMR protein [name]6: This indicates a high probability that a hereditary germline mutation is present and referral to genetic counseling is  warranted. It is recommended that the loss of protein expression be correlated with molecular based microsatellite instability testing.   IHC EXPRESSION RESULTS  TEST           RESULT  MLH1:           Preserved nuclear expression  [name]2:          Preserved nuclear expression  [name]6:          LOSS OF NUCLEAR EXPRESSION  PMS2:          Preserved nuclear expression    04/15/2021 Cancer Staging   Staging form: Colon and Rectum, AJCC 8th Edition - Pathologic stage from 04/15/2021: Stage IIC (pT4b, pN0, cM0) - Signed by [name], MD on 06/12/2021 Stage prefix: Initial diagnosis Total positive nodes: 0 Histologic grading system: 4 grade system Histologic grade (G): G2 Residual tumor (R): R0 - None    04/29/2021 Imaging   EXAM: CT ABDOMEN AND PELVIS WITH CONTRAST  IMPRESSION: Continued left retroperitoneal abscesses inferior to the left kidney, involving the left psoas muscle and left abdominal wall musculature. Overall size is decreased since prior study. Interval removal of left lower quadrant abscess drainage catheter.   New fluid collection in the cul-de-sac and wrapping around the uterus concerning for abscess.   Cholelithiasis.   Small left pleural effusion.  Bibasilar atelectasis.   Stable left adrenal adenoma.  ADDENDUM: After discussing the case with Dr. Dwaine Gale in interventional radiology, it was noted that the fluid in the pelvis originally thought to be in the cul-de-sac is likely within the vagina, best seen on sagittal imaging. Recommend speculum exam.   Also, in the left lateral wall in the area of prior abscess in the lateral wall abdominal musculature, some of the abdomen soft tissue appears to be enhancing. While this could be infectious, cannot exclude tumor seeding in the left lateral abdominal wall, measuring 6.9 x 3.8 cm on image 64 of series 2.   06/16/2021 -  Chemotherapy   Patient is on Treatment Plan : COLORECTAL FOLFOX q14d x 6 months     06/16/2021 Imaging   EXAM: CT ABDOMEN AND PELVIS WITH CONTRAST  IMPRESSION: Increased size of bulky soft tissue masses involving the left psoas muscle and left lateral abdominal wall soft tissues, consistent  with progressive metastatic disease.   Significant progression of multiple peritoneal masses throughout the abdomen and pelvis, consistent with peritoneal carcinomatosis.   New mild retroperitoneal lymphadenopathy, consistent with metastatic disease.   Stable uterine fibroid and left adrenal adenoma.   Cholelithiasis, without evidence of cholecystitis.      INTERVAL HISTORY:  Mikayla Rios is here for a follow up of sigmoid colon cancer. She was last seen by me on 06/12/21. She presents to the clinic accompanied by her husband. She reports her pain is improving, and she is sleeping better. She states her pain is on both sides but right more than left.   All other systems were reviewed with the patient and are negative.  MEDICAL HISTORY:  Past Medical History:  Diagnosis Date   Allergy    seasonal   Anemia    Blood infection 1985   Blood transfusion without reported diagnosis    Diverticulitis    Hypertension    Obesity    Sleep apnea     SURGICAL HISTORY: Past Surgical History:  Procedure Laterality Date   COLECTOMY WITH COLOSTOMY CREATION/HARTMANN PROCEDURE N/A 04/15/2021   Procedure: COLOSTOMY CREATION/HARTMANN PROCEDURE;  Surgeon: [name], MD;  Location: [facility];  Service: General;  Laterality: N/A;   IR CATHETER TUBE CHANGE  12/12/2020   IR CATHETER TUBE CHANGE  01/27/2021   IR CATHETER TUBE CHANGE  02/19/2021   IR CATHETER TUBE CHANGE  04/13/2021   IR RADIOLOGIST EVAL & MGMT  11/27/2020   IR RADIOLOGIST EVAL & MGMT  12/11/2020   IR RADIOLOGIST EVAL & MGMT  01/07/2021   IR RADIOLOGIST EVAL & MGMT  02/04/2021   IR SINUS/FIST TUBE CHK-NON GI  12/12/2020   IR SINUS/FIST TUBE CHK-NON GI  02/19/2021   LAPAROSCOPIC PARTIAL COLECTOMY N/A 04/15/2021   Procedure: LAPAROSCOPIC ASSISTED HARTMANN RESECTION;  Surgeon: [name], MD;  Location: [facility];  Service: General;  Laterality: N/A;    I have reviewed the social history and family history with the patient and  they are unchanged from previous note.  ALLERGIES:  is allergic to chlorhexidine gluconate, shellfish allergy, and penicillins.  MEDICATIONS:  Current Outpatient Medications  Medication Sig Dispense Refill   acetaminophen (TYLENOL) 325 MG tablet Take 1-2 tablets (325-650 mg total) by mouth every 4 (four) hours as needed for mild pain.     amLODipine (NORVASC) 10 MG tablet Take 10 mg by mouth daily.     diclofenac Sodium (VOLTAREN) 1 % GEL Apply 4 g topically 4 (four) times daily as needed (for mild pain). (Patient not taking: Reported on 06/12/2021) 4 g 0   DULoxetine (CYMBALTA) 30 MG capsule TAKE 1 CAPSULE BY MOUTH EVERY DAY 90 capsule 1   furosemide (LASIX) 20 MG tablet Take 1 tablet (20 mg total) by mouth daily. (Patient not taking: Reported on 06/12/2021) 30 tablet 0   gabapentin (NEURONTIN) 300 MG capsule Take 1 capsule (300 mg total) by mouth at bedtime. 30 capsule 0   methocarbamol (ROBAXIN) 500 MG tablet SMARTSIG:1000 Milligram(s) By Mouth 4 Times Daily     methocarbamol 1000 MG TABS Take 1,000 mg by mouth 4 (four) times daily. 120 tablet 0   metoprolol tartrate (LOPRESSOR) 50 MG tablet Take 1 tablet (50 mg total) by mouth 2 (two) times daily. 60 tablet 0   nitrofurantoin, macrocrystal-monohydrate, (MACROBID) 100 MG capsule Take 1 capsule (100 mg total) by mouth 2 (two) times daily for 5 days. 10 capsule 0   ondansetron (ZOFRAN) 8 MG tablet Take 1 tablet (8 mg total) by mouth 2 (two) times daily as needed for refractory nausea / vomiting. Start on day 3 after chemotherapy. 30 tablet 1   oxyCODONE-acetaminophen (PERCOCET) 7.5-325 MG tablet Take 1 tablet by mouth every 6 (six) hours as needed for severe pain or moderate pain. 90 tablet 0   pantoprazole (PROTONIX) 40 MG tablet Take 1 tablet (40 mg total) by mouth daily. (Patient not taking: Reported on 06/12/2021) 30 tablet 0   prochlorperazine (COMPAZINE) 10 MG tablet Take 1 tablet (10 mg total) by mouth every 6 (six) hours as needed (Nausea or  vomiting). 30 tablet 1   No current facility-administered medications for this visit.    PHYSICAL EXAMINATION: ECOG PERFORMANCE STATUS: 2 - Symptomatic, <50% confined to bed  Vitals:   06/19/21 1430  BP: (!) 144/97  Pulse: (!) 111  Resp: 18  Temp: 99 F (37.2 C)  SpO2: 100%   Wt Readings from Last 3 Encounters:  06/19/21 201 lb 1 oz (91.2 kg)  06/12/21 192 lb 3.2 oz (87.2 kg)  05/25/21 193 lb 4.8 oz (87.7 kg)     GENERAL:alert, no distress and comfortable SKIN: skin color normal, no rashes or significant lesions EYES:  normal, Conjunctiva are pink and non-injected, sclera clear  NEURO: alert & oriented x 3 with fluent speech  LABORATORY DATA:  I have reviewed the data as listed CBC Latest Ref Rng & Units 05/11/2021 05/07/2021 05/04/2021  WBC 4.0 - 10.5 K/uL 8.2 9.5 9.5  Hemoglobin 12.0 - 15.0 g/dL 10.4(L) 9.9(L) 10.0(L)  Hematocrit 36.0 - 46.0 % 32.3(L) 30.3(L) 32.2(L)  Platelets 150 - 400 K/uL 557(H) 597(H) 701(H)     CMP Latest Ref Rng & Units 06/16/2021 05/15/2021 05/14/2021  Glucose 70 - 99 mg/dL - 91 94  BUN 6 - 20 mg/dL - 9 9  Creatinine 0.44 - 1.00 mg/dL 0.50 0.57 0.73  Sodium 135 - 145 mmol/L - 139 135  Potassium 3.5 - 5.1 mmol/L - 3.7 3.1(L)  Chloride 98 - 111 mmol/L - 99 96(L)  CO2 22 - 32 mmol/L - 28 30  Calcium 8.9 - 10.3 mg/dL - 8.9 8.9  Total Protein 6.5 - 8.1 g/dL - - 6.6  Total Bilirubin 0.3 - 1.2 mg/dL - - 0.6  Alkaline Phos 38 - 126 U/L - - 35(L)  AST 15 - 41 U/L - - 16  ALT 0 - 44 U/L - - 11      RADIOGRAPHIC STUDIES: I have personally reviewed the radiological images as listed and agreed with the findings in the report. No results found.    Orders Placed This Encounter  Procedures   CT Biopsy    Standing Status:   Future    Standing Expiration Date:   06/20/2022    Order Specific Question:   Lab orders requested (DO NOT place separate lab orders, these will be automatically ordered during procedure specimen collection):    Answer:    Surgical Pathology    Order Specific Question:   Reason for Exam (SYMPTOM  OR DIAGNOSIS REQUIRED)    Answer:   peritoneal mass biopsy to confirm cancer recurrence    Order Specific Question:   Is patient pregnant?    Answer:   No    Order Specific Question:   Preferred location?    Answer:   [facility]   All questions were answered. The patient knows to call the clinic with any problems, questions or concerns. No barriers to learning was detected. The total time spent in the appointment was 40 minutes.     [name], MD 06/20/2021   I, Wilburn Mylar, am acting as scribe for [name], MD.   I have reviewed the above documentation for accuracy and completeness, and I agree with the above.

## 2021-06-19 NOTE — Progress Notes (Signed)
[place] CSW Progress Notes  Met w patient and spouse in exam room at oncologist request.  Couple is struggling with understandable grief due to current medical condition/prognosis and uncertainty.  She has been struggling with unresolved medical issues for several months, has endured significant medical interventions, now grappling with uncertainty re cancer spread.  Grief re potential future losses and impact on children and spouse.  Empathized with patient and spouse, provided reflective listening and affirmed need for time for grief to process.  Encouraged waiting to speak w children until she has more clarity re prognosis and treatment plan, as well as having time to process her own and spouses grief.  As undersigned CSW will not be able to follow patient long term, messaged others in [employer] requesting that they follow patient over time for support and resources.  Edwyna Shell, LCSW Clinical Social Worker Phone:  [phone]

## 2021-06-19 NOTE — Discharge Instructions (Signed)
Added 2 piece pouch back today Samples of convex barrier will be mailed to you Added ostomy belt Sent home with  Skin prep (No sting)  Barrier ring [employer] lubricating deodorant Has stoma powder already

## 2021-06-20 ENCOUNTER — Encounter: Payer: Self-pay | Admitting: Hematology

## 2021-06-22 ENCOUNTER — Other Ambulatory Visit: Payer: Self-pay

## 2021-06-22 ENCOUNTER — Inpatient Hospital Stay: Payer: BC Managed Care – PPO

## 2021-06-22 ENCOUNTER — Encounter: Payer: Self-pay | Admitting: Licensed Clinical Social Worker

## 2021-06-22 ENCOUNTER — Inpatient Hospital Stay: Payer: BC Managed Care – PPO | Admitting: Hematology

## 2021-06-22 ENCOUNTER — Ambulatory Visit (HOSPITAL_COMMUNITY)
Admission: RE | Admit: 2021-06-22 | Discharge: 2021-06-22 | Disposition: A | Discharge status: Home / Self Care | Payer: BC Managed Care – PPO | Source: Ambulatory Visit | Attending: Hematology | Admitting: Hematology

## 2021-06-22 VITALS — BP 132/88 | HR 100 | Temp 98.5°F | Resp 16

## 2021-06-22 DIAGNOSIS — D3502 Benign neoplasm of left adrenal gland: Secondary | ICD-10-CM | POA: Diagnosis not present

## 2021-06-22 DIAGNOSIS — Z79899 Other long term (current) drug therapy: Secondary | ICD-10-CM | POA: Diagnosis not present

## 2021-06-22 DIAGNOSIS — G893 Neoplasm related pain (acute) (chronic): Secondary | ICD-10-CM | POA: Diagnosis not present

## 2021-06-22 DIAGNOSIS — Z452 Encounter for adjustment and management of vascular access device: Secondary | ICD-10-CM | POA: Diagnosis not present

## 2021-06-22 DIAGNOSIS — K529 Noninfective gastroenteritis and colitis, unspecified: Secondary | ICD-10-CM | POA: Diagnosis not present

## 2021-06-22 DIAGNOSIS — G4730 Sleep apnea, unspecified: Secondary | ICD-10-CM | POA: Diagnosis not present

## 2021-06-22 DIAGNOSIS — Z5111 Encounter for antineoplastic chemotherapy: Secondary | ICD-10-CM | POA: Diagnosis not present

## 2021-06-22 DIAGNOSIS — E669 Obesity, unspecified: Secondary | ICD-10-CM | POA: Diagnosis not present

## 2021-06-22 DIAGNOSIS — Z95828 Presence of other vascular implants and grafts: Secondary | ICD-10-CM

## 2021-06-22 DIAGNOSIS — I10 Essential (primary) hypertension: Secondary | ICD-10-CM | POA: Diagnosis not present

## 2021-06-22 DIAGNOSIS — D649 Anemia, unspecified: Secondary | ICD-10-CM

## 2021-06-22 DIAGNOSIS — C187 Malignant neoplasm of sigmoid colon: Secondary | ICD-10-CM

## 2021-06-22 DIAGNOSIS — F418 Other specified anxiety disorders: Secondary | ICD-10-CM | POA: Diagnosis not present

## 2021-06-22 DIAGNOSIS — R109 Unspecified abdominal pain: Secondary | ICD-10-CM | POA: Diagnosis not present

## 2021-06-22 DIAGNOSIS — D259 Leiomyoma of uterus, unspecified: Secondary | ICD-10-CM | POA: Diagnosis not present

## 2021-06-22 DIAGNOSIS — R634 Abnormal weight loss: Secondary | ICD-10-CM | POA: Diagnosis not present

## 2021-06-22 DIAGNOSIS — K8020 Calculus of gallbladder without cholecystitis without obstruction: Secondary | ICD-10-CM | POA: Diagnosis not present

## 2021-06-22 DIAGNOSIS — Z800 Family history of malignant neoplasm of digestive organs: Secondary | ICD-10-CM | POA: Diagnosis not present

## 2021-06-22 DIAGNOSIS — J90 Pleural effusion, not elsewhere classified: Secondary | ICD-10-CM | POA: Diagnosis not present

## 2021-06-22 LAB — CBC WITH DIFFERENTIAL (CANCER CENTER ONLY)
Abs Immature Granulocytes: 0.04 10*3/uL (ref 0.00–0.07)
Basophils Absolute: 0 10*3/uL (ref 0.0–0.1)
Basophils Relative: 1 %
Eosinophils Absolute: 0.5 10*3/uL (ref 0.0–0.5)
Eosinophils Relative: 6 %
HCT: 34.2 % — ABNORMAL LOW (ref 36.0–46.0)
Hemoglobin: 11 g/dL — ABNORMAL LOW (ref 12.0–15.0)
Immature Granulocytes: 1 %
Lymphocytes Relative: 19 %
Lymphs Abs: 1.7 10*3/uL (ref 0.7–4.0)
MCH: 27 pg (ref 26.0–34.0)
MCHC: 32.2 g/dL (ref 30.0–36.0)
MCV: 84 fL (ref 80.0–100.0)
Monocytes Absolute: 0.3 10*3/uL (ref 0.1–1.0)
Monocytes Relative: 3 %
Neutro Abs: 6.2 10*3/uL (ref 1.7–7.7)
Neutrophils Relative %: 70 %
Platelet Count: 583 10*3/uL — ABNORMAL HIGH (ref 150–400)
RBC: 4.07 MIL/uL (ref 3.87–5.11)
RDW: 14.5 % (ref 11.5–15.5)
WBC Count: 8.8 10*3/uL (ref 4.0–10.5)
nRBC: 0 % (ref 0.0–0.2)

## 2021-06-22 LAB — IRON AND IRON BINDING CAPACITY (CC-WL,HP ONLY)
Iron: 37 ug/dL (ref 28–170)
Saturation Ratios: 14 % (ref 10.4–31.8)
TIBC: 263 ug/dL (ref 250–450)
UIBC: 226 ug/dL (ref 148–442)

## 2021-06-22 LAB — CMP (CANCER CENTER ONLY)
ALT: 5 U/L (ref 0–44)
AST: 11 U/L — ABNORMAL LOW (ref 15–41)
Albumin: 3.7 g/dL (ref 3.5–5.0)
Alkaline Phosphatase: 38 U/L (ref 38–126)
Anion gap: 10 (ref 5–15)
BUN: 8 mg/dL (ref 6–20)
CO2: 32 mmol/L (ref 22–32)
Calcium: 9.3 mg/dL (ref 8.9–10.3)
Chloride: 97 mmol/L — ABNORMAL LOW (ref 98–111)
Creatinine: 0.56 mg/dL (ref 0.44–1.00)
GFR, Estimated: >60 mL/min (ref >60)
Glucose, Bld: 104 mg/dL — ABNORMAL HIGH (ref 70–99)
Potassium: 3 mmol/L — ABNORMAL LOW (ref 3.5–5.1)
Sodium: 139 mmol/L (ref 135–145)
Total Bilirubin: 0.3 mg/dL (ref 0.3–1.2)
Total Protein: 7.6 g/dL (ref 6.5–8.1)

## 2021-06-22 LAB — CEA (IN HOUSE-CHCC): CEA (CHCC-In House): 87.41 ng/mL — ABNORMAL HIGH (ref 0.00–5.00)

## 2021-06-22 LAB — FERRITIN: Ferritin: 110 ng/mL (ref 11–307)

## 2021-06-22 MED ORDER — SODIUM CHLORIDE 0.9 % IV SOLN
10.0000 mg | Freq: Once | INTRAVENOUS | Status: AC
  Administered 2021-06-22: 10 mg via INTRAVENOUS
  Filled 2021-06-22: qty 10

## 2021-06-22 MED ORDER — OXALIPLATIN CHEMO INJECTION 100 MG/20ML
85.0000 mg/m2 | Freq: Once | INTRAVENOUS | Status: AC
  Administered 2021-06-22: 165 mg via INTRAVENOUS
  Filled 2021-06-22: qty 33

## 2021-06-22 MED ORDER — SODIUM CHLORIDE 0.9% FLUSH: 10.0000 mL | INTRAVENOUS | Status: DC | PRN

## 2021-06-22 MED ORDER — FLUOROURACIL CHEMO INJECTION 2.5 GM/50ML
400.0000 mg/m2 | Freq: Once | INTRAVENOUS | Status: AC
  Administered 2021-06-22: 800 mg via INTRAVENOUS
  Filled 2021-06-22: qty 16

## 2021-06-22 MED ORDER — LEUCOVORIN CALCIUM INJECTION 350 MG
400.0000 mg/m2 | Freq: Once | INTRAVENOUS | Status: AC
  Administered 2021-06-22: 788 mg via INTRAVENOUS
  Filled 2021-06-22: qty 25

## 2021-06-22 MED ORDER — HEPARIN SOD (PORK) LOCK FLUSH 100 UNIT/ML IV SOLN: 500.0000 [IU] | Freq: Once | INTRAVENOUS | Status: DC | PRN

## 2021-06-22 MED ORDER — LIDOCAINE HCL 1 % IJ SOLN
INTRAMUSCULAR | Status: AC
  Filled 2021-06-22: qty 20

## 2021-06-22 MED ORDER — PALONOSETRON HCL INJECTION 0.25 MG/5ML
0.2500 mg | Freq: Once | INTRAVENOUS | Status: AC
  Administered 2021-06-22: 0.25 mg via INTRAVENOUS
  Filled 2021-06-22: qty 5

## 2021-06-22 MED ORDER — LIDOCAINE HCL 1 % IJ SOLN
INTRAMUSCULAR | Status: DC | PRN
  Administered 2021-06-22: 5 mL via INTRADERMAL

## 2021-06-22 MED ORDER — SODIUM CHLORIDE 0.9 % IV SOLN
2400.0000 mg/m2 | INTRAVENOUS | Status: DC
  Administered 2021-06-22: 4750 mg via INTRAVENOUS
  Filled 2021-06-22: qty 95

## 2021-06-22 MED ORDER — DEXTROSE 5 % IV SOLN: Freq: Once | INTRAVENOUS | Status: AC

## 2021-06-22 MED ORDER — SODIUM CHLORIDE 0.9% FLUSH
10.0000 mL | INTRAVENOUS | Status: AC | PRN
  Administered 2021-06-22: 10 mL

## 2021-06-22 NOTE — Progress Notes (Signed)
[facility] Radiology to find out how many cm in the RT atrium the RT 5Fr Dbl PICC is in.  Quillian Quince in Reading Room stated Dr. Laurence Ferrari has finalized the radiology report for the pt's PICC.  Dr. Laurence Ferrari stated that the pt's PICC catheter tip terminates in the [place].  No need to have PICC retracted.

## 2021-06-22 NOTE — Progress Notes (Signed)
[facility] Radiology [phone] to get STAT read on PICC placement.  Reading room RN contact Dr. Suezanne Jacquet to get a STAT read on PICC.  Dr. Winfred Burn stated the PICC is placed in the brachial celiac vein and the 5Fr Dbl PICC catheter tip terminates into the Rt Atrium.  Spoke with Dr. Burr Medico regarding read returns for pt's PICC and Dr. Burr Medico said PICC is "OK To Use for labs & Infusion today"  Dr. Burr Medico would like for the PICC to be retracted to CAJ.  Dr. Burr Medico has given the OK to start premedications for treatment today.

## 2021-06-22 NOTE — Patient Instructions (Addendum)
[facility]  Discharge Instructions: °Thank you for choosing [facility] to provide your oncology and hematology care.  ° °If you have a lab appointment with the Cancer Center, please go directly to the Cancer Center and check in at the registration area. °  °Wear comfortable clothing and clothing appropriate for easy access to any Portacath or PICC line.  ° °We strive to give you quality time with your provider. You may need to reschedule your appointment if you arrive late (15 or more minutes).  Arriving late affects you and other patients whose appointments are after yours.  Also, if you miss three or more appointments without notifying the office, you may be dismissed from the clinic at the provider’s discretion.    °  °For prescription refill requests, have your pharmacy contact our office and allow 72 hours for refills to be completed.   ° °Today you received the following chemotherapy and/or immunotherapy agents: Oxaliplatin, Leucovorin, and Fluorouracil. °  °To help prevent nausea and vomiting after your treatment, we encourage you to take your nausea medication as directed. ° °BELOW ARE SYMPTOMS THAT SHOULD BE REPORTED IMMEDIATELY: °*FEVER GREATER THAN 100.4 F (38 °C) OR HIGHER °*CHILLS OR SWEATING °*NAUSEA AND VOMITING THAT IS NOT CONTROLLED WITH YOUR NAUSEA MEDICATION °*UNUSUAL SHORTNESS OF BREATH °*UNUSUAL BRUISING OR BLEEDING °*URINARY PROBLEMS (pain or burning when urinating, or frequent urination) °*BOWEL PROBLEMS (unusual diarrhea, constipation, pain near the anus) °TENDERNESS IN MOUTH AND THROAT WITH OR WITHOUT PRESENCE OF ULCERS (sore throat, sores in mouth, or a toothache) °UNUSUAL RASH, SWELLING OR PAIN  °UNUSUAL VAGINAL DISCHARGE OR ITCHING  ° °Items with * indicate a potential emergency and should be followed up as soon as possible or go to the Emergency Department if any problems should occur. ° °Please show the CHEMOTHERAPY ALERT CARD or  IMMUNOTHERAPY ALERT CARD at check-in to the Emergency Department and triage nurse. ° °Should you have questions after your visit or need to cancel or reschedule your appointment, please contact [facility]  Dept: [phone]  and follow the prompts.  Office hours are 8:00 a.m. to 4:30 p.m. Monday - Friday. Please note that voicemails left after 4:00 p.m. may not be returned until the following business day.  We are closed weekends and major holidays. You have access to a nurse at all times for urgent questions. Please call the main number to the clinic Dept: [phone] and follow the prompts. ° ° °For any non-urgent questions, you may also contact your provider using MyChart. We now offer e-Visits for anyone 18 and older to request care online for non-urgent symptoms. For details visit [email or web address]. °  °Also download the MyChart app! Go to the app store, search "MyChart", open the app, select [facility], and log in with your MyChart username and password. ° °Due to Covid, a mask is required upon entering the hospital/clinic. If you do not have a mask, one will be given to you upon arrival. For doctor visits, patients may have 1 support person aged 18 or older with them. For treatment visits, patients cannot have anyone with them due to current Covid guidelines and our immunocompromised population.  ° °The chemotherapy medication bag should finish at 46 hours, 96 hours, or 7 days. For example, if your pump is scheduled for 46 hours and it was put on at 4:00 p.m., it should finish at 2:00 p.m. the day it is scheduled to come off regardless of your   appointment time.   °  °Estimated time to finish at: 4:00 PM °  °If the display on your pump reads "Low Volume" and it is beeping, take the batteries out of the pump and come to the cancer center for it to be taken off.  ° °If the pump alarms go off prior to the pump reading "Low Volume" then call [phone] and someone can  assist you. ° °If the plunger comes out and the chemotherapy medication is leaking out, please use your home chemo spill kit to clean up the spill. Do NOT use paper towels or other household products. ° °If you have problems or questions regarding your pump, please call either [phone] (24 hours a day) or the cancer center Monday-Friday 8:00 a.m.- 4:30 p.m. at the clinic number and we will assist you. If you are unable to get assistance, then go to the nearest Emergency Department and ask the staff to contact the IV team for assistance.   ° °Oxaliplatin Injection °What is this medication? °OXALIPLATIN (ox AL i PLA tin) is a chemotherapy drug. It targets fast dividing cells, like cancer cells, and causes these cells to die. This medicine is used to treat cancers of the colon and rectum, and many other cancers. °This medicine may be used for other purposes; ask your health care provider or pharmacist if you have questions. °COMMON BRAND NAME(S): Eloxatin °What should I tell my care team before I take this medication? °They need to know if you have any of these conditions: °heart disease °history of irregular heartbeat °liver disease °low blood counts, like white cells, platelets, or red blood cells °lung or breathing disease, like asthma °take medicines that treat or prevent blood clots °tingling of the fingers or toes, or other nerve disorder °an unusual or allergic reaction to oxaliplatin, other chemotherapy, other medicines, foods, dyes, or preservatives °pregnant or trying to get pregnant °breast-feeding °How should I use this medication? °This drug is given as an infusion into a vein. It is administered in a hospital or clinic by a specially trained health care professional. °Talk to your pediatrician regarding the use of this medicine in children. Special care may be needed. °Overdosage: If you think you have taken too much of this medicine contact a poison control center or emergency room at once. °NOTE:  This medicine is only for you. Do not share this medicine with others. °What if I miss a dose? °It is important not to miss a dose. Call your doctor or health care professional if you are unable to keep an appointment. °What may interact with this medication? °Do not take this medicine with any of the following medications: °cisapride °dronedarone °pimozide °thioridazine °This medicine may also interact with the following medications: °aspirin and aspirin-like medicines °certain medicines that treat or prevent blood clots like warfarin, apixaban, dabigatran, and rivaroxaban °cisplatin °cyclosporine °diuretics °medicines for infection like acyclovir, adefovir, amphotericin B, bacitracin, cidofovir, foscarnet, ganciclovir, gentamicin, pentamidine, vancomycin °NSAIDs, medicines for pain and inflammation, like ibuprofen or naproxen °other medicines that prolong the QT interval (an abnormal heart rhythm) °pamidronate °zoledronic acid °This list may not describe all possible interactions. Give your health care provider a list of all the medicines, herbs, non-prescription drugs, or dietary supplements you use. Also tell them if you smoke, drink alcohol, or use illegal drugs. Some items may interact with your medicine. °What should I watch for while using this medication? °Your condition will be monitored carefully while you are receiving this medicine. °You may need   blood work done while you are taking this medicine. °This medicine may make you feel generally unwell. This is not uncommon as chemotherapy can affect healthy cells as well as cancer cells. Report any side effects. Continue your course of treatment even though you feel ill unless your healthcare professional tells you to stop. °This medicine can make you more sensitive to cold. Do not drink cold drinks or use ice. Cover exposed skin before coming in contact with cold temperatures or cold objects. When out in cold weather wear warm clothing and cover your mouth  and nose to warm the air that goes into your lungs. Tell your doctor if you get sensitive to the cold. °Do not become pregnant while taking this medicine or for 9 months after stopping it. Women should inform their health care professional if they wish to become pregnant or think they might be pregnant. Men should not father a child while taking this medicine and for 6 months after stopping it. There is potential for serious side effects to an unborn child. Talk to your health care professional for more information. °Do not breast-feed a child while taking this medicine or for 3 months after stopping it. °This medicine has caused ovarian failure in some women. This medicine may make it more difficult to get pregnant. Talk to your health care professional if you are concerned about your fertility. °This medicine has caused decreased sperm counts in some men. This may make it more difficult to father a child. Talk to your health care professional if you are concerned about your fertility. °This medicine may increase your risk of getting an infection. Call your health care professional for advice if you get a fever, chills, or sore throat, or other symptoms of a cold or flu. Do not treat yourself. Try to avoid being around people who are sick. °Avoid taking medicines that contain aspirin, acetaminophen, ibuprofen, naproxen, or ketoprofen unless instructed by your health care professional. These medicines may hide a fever. °Be careful brushing or flossing your teeth or using a toothpick because you may get an infection or bleed more easily. If you have any dental work done, tell your dentist you are receiving this medicine. °What side effects may I notice from receiving this medication? °Side effects that you should report to your doctor or health care professional as soon as possible: °allergic reactions like skin rash, itching or hives, swelling of the face, lips, or tongue °breathing problems °cough °low blood counts  - this medicine may decrease the number of white blood cells, red blood cells, and platelets. You may be at increased risk for infections and bleeding °nausea, vomiting °pain, redness, or irritation at site where injected °pain, tingling, numbness in the hands or feet °signs and symptoms of bleeding such as bloody or black, tarry stools; red or dark brown urine; spitting up blood or brown material that looks like coffee grounds; red spots on the skin; unusual bruising or bleeding from the eyes, gums, or nose °signs and symptoms of a dangerous change in heartbeat or heart rhythm like chest pain; dizziness; fast, irregular heartbeat; palpitations; feeling faint or lightheaded; falls °signs and symptoms of infection like fever; chills; cough; sore throat; pain or trouble passing urine °signs and symptoms of liver injury like dark yellow or brown urine; general ill feeling or flu-like symptoms; light-colored stools; loss of appetite; nausea; right upper belly pain; unusually weak or tired; yellowing of the eyes or skin °signs and symptoms of low red blood cells   or anemia such as unusually weak or tired; feeling faint or lightheaded; falls °signs and symptoms of muscle injury like dark urine; trouble passing urine or change in the amount of urine; unusually weak or tired; muscle pain; back pain °Side effects that usually do not require medical attention (report to your doctor or health care professional if they continue or are bothersome): °changes in taste °diarrhea °gas °hair loss °loss of appetite °mouth sores °This list may not describe all possible side effects. Call your doctor for medical advice about side effects. You may report side effects to FDA at 1-800-FDA-1088. °Where should I keep my medication? °This drug is given in a hospital or clinic and will not be stored at home. °NOTE: This sheet is a summary. It may not cover all possible information. If you have questions about this medicine, talk to your doctor,  pharmacist, or health care provider. °© 2022 Elsevier/Gold Standard (2021-02-10 00:00:00) ° °Leucovorin injection °What is this medication? °LEUCOVORIN (loo koe VOR in) is used to prevent or treat the harmful effects of some medicines. This medicine is used to treat anemia caused by a low amount of folic acid in the body. It is also used with 5-fluorouracil (5-FU) to treat colon cancer. °This medicine may be used for other purposes; ask your health care provider or pharmacist if you have questions. °What should I tell my care team before I take this medication? °They need to know if you have any of these conditions: °anemia from low levels of vitamin B-12 in the blood °an unusual or allergic reaction to leucovorin, folic acid, other medicines, foods, dyes, or preservatives °pregnant or trying to get pregnant °breast-feeding °How should I use this medication? °This medicine is for injection into a muscle or into a vein. It is given by a health care professional in a hospital or clinic setting. °Talk to your pediatrician regarding the use of this medicine in children. Special care may be needed. °Overdosage: If you think you have taken too much of this medicine contact a poison control center or emergency room at once. °NOTE: This medicine is only for you. Do not share this medicine with others. °What if I miss a dose? °This does not apply. °What may interact with this medication? °capecitabine °fluorouracil °phenobarbital °phenytoin °primidone °trimethoprim-sulfamethoxazole °This list may not describe all possible interactions. Give your health care provider a list of all the medicines, herbs, non-prescription drugs, or dietary supplements you use. Also tell them if you smoke, drink alcohol, or use illegal drugs. Some items may interact with your medicine. °What should I watch for while using this medication? °Your condition will be monitored carefully while you are receiving this medicine. °This medicine may  increase the side effects of 5-fluorouracil, 5-FU. Tell your doctor or health care professional if you have diarrhea or mouth sores that do not get better or that get worse. °What side effects may I notice from receiving this medication? °Side effects that you should report to your doctor or health care professional as soon as possible: °allergic reactions like skin rash, itching or hives, swelling of the face, lips, or tongue °breathing problems °fever, infection °mouth sores °unusual bleeding or bruising °unusually weak or tired °Side effects that usually do not require medical attention (report to your doctor or health care professional if they continue or are bothersome): °constipation or diarrhea °loss of appetite °nausea, vomiting °This list may not describe all possible side effects. Call your doctor for medical advice about side effects.   You may report side effects to FDA at 1-800-FDA-1088. °Where should I keep my medication? °This drug is given in a hospital or clinic and will not be stored at home. °NOTE: This sheet is a summary. It may not cover all possible information. If you have questions about this medicine, talk to your doctor, pharmacist, or health care provider. °© 2022 Elsevier/Gold Standard (2007-11-30 00:00:00) ° °Fluorouracil, 5-FU injection °What is this medication? °FLUOROURACIL, 5-FU (flure oh YOOR a sil) is a chemotherapy drug. It slows the growth of cancer cells. This medicine is used to treat many types of cancer like breast cancer, colon or rectal cancer, pancreatic cancer, and stomach cancer. °This medicine may be used for other purposes; ask your health care provider or pharmacist if you have questions. °COMMON BRAND NAME(S): Adrucil °What should I tell my care team before I take this medication? °They need to know if you have any of these conditions: °blood disorders °dihydropyrimidine dehydrogenase (DPD) deficiency °infection (especially a virus infection such as chickenpox, cold  sores, or herpes) °kidney disease °liver disease °malnourished, poor nutrition °recent or ongoing radiation therapy °an unusual or allergic reaction to fluorouracil, other chemotherapy, other medicines, foods, dyes, or preservatives °pregnant or trying to get pregnant °breast-feeding °How should I use this medication? °This drug is given as an infusion or injection into a vein. It is administered in a hospital or clinic by a specially trained health care professional. °Talk to your pediatrician regarding the use of this medicine in children. Special care may be needed. °Overdosage: If you think you have taken too much of this medicine contact a poison control center or emergency room at once. °NOTE: This medicine is only for you. Do not share this medicine with others. °What if I miss a dose? °It is important not to miss your dose. Call your doctor or health care professional if you are unable to keep an appointment. °What may interact with this medication? °Do not take this medicine with any of the following medications: °live virus vaccines °This medicine may also interact with the following medications: °medicines that treat or prevent blood clots like warfarin, enoxaparin, and dalteparin °This list may not describe all possible interactions. Give your health care provider a list of all the medicines, herbs, non-prescription drugs, or dietary supplements you use. Also tell them if you smoke, drink alcohol, or use illegal drugs. Some items may interact with your medicine. °What should I watch for while using this medication? °Visit your doctor for checks on your progress. This drug may make you feel generally unwell. This is not uncommon, as chemotherapy can affect healthy cells as well as cancer cells. Report any side effects. Continue your course of treatment even though you feel ill unless your doctor tells you to stop. °In some cases, you may be given additional medicines to help with side effects. Follow all  directions for their use. °Call your doctor or health care professional for advice if you get a fever, chills or sore throat, or other symptoms of a cold or flu. Do not treat yourself. This drug decreases your body's ability to fight infections. Try to avoid being around people who are sick. °This medicine may increase your risk to bruise or bleed. Call your doctor or health care professional if you notice any unusual bleeding. °Be careful brushing and flossing your teeth or using a toothpick because you may get an infection or bleed more easily. If you have any dental work done, tell your dentist you   are receiving this medicine. Avoid taking products that contain aspirin, acetaminophen, ibuprofen, naproxen, or ketoprofen unless instructed by your doctor. These medicines may hide a fever. Do not become pregnant while taking this medicine. Women should inform their doctor if they wish to become pregnant or think they might be pregnant. There is a potential for serious side effects to an unborn child. Talk to your health care professional or pharmacist for more information. Do not breast-feed an infant while taking this medicine. Men should inform their doctor if they wish to father a child. This medicine may lower sperm counts. Do not treat diarrhea with over the counter products. Contact your doctor if you have diarrhea that lasts more than 2 days or if it is severe and watery. This medicine can make you more sensitive to the sun. Keep out of the sun. If you cannot avoid being in the sun, wear protective clothing and use sunscreen. Do not use sun lamps or tanning beds/booths. What side effects may I notice from receiving this medication? Side effects that you should report to your doctor or health care professional as soon as possible: allergic reactions like skin rash, itching or hives, swelling of the face, lips, or tongue low blood counts - this medicine may decrease the number of white blood cells, red  blood cells and platelets. You may be at increased risk for infections and bleeding. signs of infection - fever or chills, cough, sore throat, pain or difficulty passing urine signs of decreased platelets or bleeding - bruising, pinpoint red spots on the skin, black, tarry stools, blood in the urine signs of decreased red blood cells - unusually weak or tired, fainting spells, lightheadedness breathing problems changes in vision chest pain mouth sores nausea and vomiting pain, swelling, redness at site where injected pain, tingling, numbness in the hands or feet redness, swelling, or sores on hands or feet stomach pain unusual bleeding Side effects that usually do not require medical attention (report to your doctor or health care professional if they continue or are bothersome): changes in finger or toe nails diarrhea dry or itchy skin hair loss headache loss of appetite sensitivity of eyes to the light stomach upset unusually teary eyes This list may not describe all possible side effects. Call your doctor for medical advice about side effects. You may report side effects to FDA at 1-800-FDA-1088. Where should I keep my medication? This drug is given in a hospital or clinic and will not be stored at home. NOTE: This sheet is a summary. It may not cover all possible information. If you have questions about this medicine, talk to your doctor, pharmacist, or health care provider.  2022 Elsevier/Gold Standard (2021-02-10 00:00:00)

## 2021-06-22 NOTE — Progress Notes (Signed)
Per Dr. Burr Medico, "OK To start premedications while labs resulting today 06/22/2021".

## 2021-06-22 NOTE — Progress Notes (Signed)
LVM on pt's cell phone regarding appts today.  Pt did not show for PICC placement appt this morning at 8am.  Pt is scheduled to start chemotherapy this today.  Sent message to [facility] and made Dr. Burr Medico aware of pt's "NO SHOW".

## 2021-06-22 NOTE — Progress Notes (Signed)
[name]  CSW introduced self to patient in infusion per request from [employer].  Provided direct contact information. CSW will plan to see pt when she is here for infusion treatments per pt request.   Christeen Douglas, LCSW

## 2021-06-23 ENCOUNTER — Encounter (HOSPITAL_COMMUNITY): Payer: Self-pay | Admitting: Radiology

## 2021-06-23 ENCOUNTER — Other Ambulatory Visit: Payer: Self-pay | Admitting: Hematology

## 2021-06-23 ENCOUNTER — Telehealth: Payer: Self-pay

## 2021-06-23 ENCOUNTER — Encounter: Payer: BC Managed Care – PPO | Admitting: Registered Nurse

## 2021-06-23 DIAGNOSIS — D62 Acute posthemorrhagic anemia: Secondary | ICD-10-CM | POA: Diagnosis not present

## 2021-06-23 DIAGNOSIS — Z483 Aftercare following surgery for neoplasm: Secondary | ICD-10-CM | POA: Diagnosis not present

## 2021-06-23 DIAGNOSIS — I10 Essential (primary) hypertension: Secondary | ICD-10-CM | POA: Diagnosis not present

## 2021-06-23 DIAGNOSIS — Z933 Colostomy status: Secondary | ICD-10-CM | POA: Diagnosis not present

## 2021-06-23 DIAGNOSIS — K5792 Diverticulitis of intestine, part unspecified, without perforation or abscess without bleeding: Secondary | ICD-10-CM | POA: Diagnosis not present

## 2021-06-23 DIAGNOSIS — Z6837 Body mass index (BMI) 37.0-37.9, adult: Secondary | ICD-10-CM | POA: Diagnosis not present

## 2021-06-23 DIAGNOSIS — E876 Hypokalemia: Secondary | ICD-10-CM | POA: Diagnosis not present

## 2021-06-23 DIAGNOSIS — E669 Obesity, unspecified: Secondary | ICD-10-CM | POA: Diagnosis not present

## 2021-06-23 DIAGNOSIS — E440 Moderate protein-calorie malnutrition: Secondary | ICD-10-CM | POA: Diagnosis not present

## 2021-06-23 DIAGNOSIS — Z4801 Encounter for change or removal of surgical wound dressing: Secondary | ICD-10-CM | POA: Diagnosis not present

## 2021-06-23 DIAGNOSIS — C189 Malignant neoplasm of colon, unspecified: Secondary | ICD-10-CM | POA: Diagnosis not present

## 2021-06-23 DIAGNOSIS — Z79891 Long term (current) use of opiate analgesic: Secondary | ICD-10-CM | POA: Diagnosis not present

## 2021-06-23 DIAGNOSIS — G4730 Sleep apnea, unspecified: Secondary | ICD-10-CM | POA: Diagnosis not present

## 2021-06-23 DIAGNOSIS — Z9049 Acquired absence of other specified parts of digestive tract: Secondary | ICD-10-CM | POA: Diagnosis not present

## 2021-06-23 MED ORDER — POTASSIUM CHLORIDE CRYS ER 20 MEQ PO TBCR: 20.0000 meq | EXTENDED_RELEASE_TABLET | Freq: Every day | ORAL | 1 refills | Status: DC

## 2021-06-23 NOTE — Telephone Encounter (Signed)
-----   Message from [name], MD sent at 06/23/2021  2:20 PM EST ----- Please let her know her K is low 3.0, I called in KCL for her, 1 tab twice daily for 3 days then once daily, thx   [name]

## 2021-06-23 NOTE — Progress Notes (Signed)
I spoke with Ms Vaughn's [facility], Clarise Cruz.  Confirmed orders for PICC line teaching.  I confirmed that the dressing changes will be done at the cancer center.

## 2021-06-23 NOTE — Telephone Encounter (Signed)
Mikayla Rios sent our office a message stating that she was continuing to have pain, despite taking her pain medications. I advised her, per Lexine Baton, NP, to increase her oxycodone by taking two tablets every 4 hours and to increase her Gabepentin by taking an additional dose in the afternoon. I advised her to call our office if this doesn't help with her pain and reminded her that she has an appt with [place] Monday, where we may adjust her pain medications. Understanding verbalized. All questions answered.

## 2021-06-23 NOTE — Progress Notes (Signed)
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

## 2021-06-23 NOTE — Telephone Encounter (Signed)
Per Dr. Burr Medico, pt was told by this LPN that her potassium level is low at 3.0 and that KCL has been prescribed to her pharmacy on file. Pt was made aware she would need to take 1 tab daily for 3 days then once daily. Pt verbalized understanding and states she just got a text message from her pharmacy about this medication to get it soon.

## 2021-06-24 ENCOUNTER — Other Ambulatory Visit: Payer: Self-pay

## 2021-06-24 ENCOUNTER — Other Ambulatory Visit: Payer: Self-pay | Admitting: Licensed Clinical Social Worker

## 2021-06-24 ENCOUNTER — Inpatient Hospital Stay: Payer: BC Managed Care – PPO

## 2021-06-24 DIAGNOSIS — G893 Neoplasm related pain (acute) (chronic): Secondary | ICD-10-CM | POA: Diagnosis not present

## 2021-06-24 DIAGNOSIS — G4730 Sleep apnea, unspecified: Secondary | ICD-10-CM | POA: Diagnosis not present

## 2021-06-24 DIAGNOSIS — Z79899 Other long term (current) drug therapy: Secondary | ICD-10-CM | POA: Diagnosis not present

## 2021-06-24 DIAGNOSIS — Z79891 Long term (current) use of opiate analgesic: Secondary | ICD-10-CM | POA: Diagnosis not present

## 2021-06-24 DIAGNOSIS — Z6837 Body mass index (BMI) 37.0-37.9, adult: Secondary | ICD-10-CM | POA: Diagnosis not present

## 2021-06-24 DIAGNOSIS — R109 Unspecified abdominal pain: Secondary | ICD-10-CM | POA: Diagnosis not present

## 2021-06-24 DIAGNOSIS — Z483 Aftercare following surgery for neoplasm: Secondary | ICD-10-CM | POA: Diagnosis not present

## 2021-06-24 DIAGNOSIS — C187 Malignant neoplasm of sigmoid colon: Secondary | ICD-10-CM

## 2021-06-24 DIAGNOSIS — D259 Leiomyoma of uterus, unspecified: Secondary | ICD-10-CM | POA: Diagnosis not present

## 2021-06-24 DIAGNOSIS — C189 Malignant neoplasm of colon, unspecified: Secondary | ICD-10-CM | POA: Diagnosis not present

## 2021-06-24 DIAGNOSIS — K5792 Diverticulitis of intestine, part unspecified, without perforation or abscess without bleeding: Secondary | ICD-10-CM | POA: Diagnosis not present

## 2021-06-24 DIAGNOSIS — Z4801 Encounter for change or removal of surgical wound dressing: Secondary | ICD-10-CM | POA: Diagnosis not present

## 2021-06-24 DIAGNOSIS — Z9049 Acquired absence of other specified parts of digestive tract: Secondary | ICD-10-CM | POA: Diagnosis not present

## 2021-06-24 DIAGNOSIS — Z5111 Encounter for antineoplastic chemotherapy: Secondary | ICD-10-CM | POA: Diagnosis not present

## 2021-06-24 DIAGNOSIS — D3502 Benign neoplasm of left adrenal gland: Secondary | ICD-10-CM | POA: Diagnosis not present

## 2021-06-24 DIAGNOSIS — R634 Abnormal weight loss: Secondary | ICD-10-CM | POA: Diagnosis not present

## 2021-06-24 DIAGNOSIS — E669 Obesity, unspecified: Secondary | ICD-10-CM | POA: Diagnosis not present

## 2021-06-24 DIAGNOSIS — F418 Other specified anxiety disorders: Secondary | ICD-10-CM | POA: Diagnosis not present

## 2021-06-24 DIAGNOSIS — K529 Noninfective gastroenteritis and colitis, unspecified: Secondary | ICD-10-CM | POA: Diagnosis not present

## 2021-06-24 DIAGNOSIS — I10 Essential (primary) hypertension: Secondary | ICD-10-CM | POA: Diagnosis not present

## 2021-06-24 DIAGNOSIS — Z800 Family history of malignant neoplasm of digestive organs: Secondary | ICD-10-CM | POA: Diagnosis not present

## 2021-06-24 DIAGNOSIS — Z933 Colostomy status: Secondary | ICD-10-CM | POA: Diagnosis not present

## 2021-06-24 DIAGNOSIS — E876 Hypokalemia: Secondary | ICD-10-CM | POA: Diagnosis not present

## 2021-06-24 DIAGNOSIS — D62 Acute posthemorrhagic anemia: Secondary | ICD-10-CM | POA: Diagnosis not present

## 2021-06-24 DIAGNOSIS — K8020 Calculus of gallbladder without cholecystitis without obstruction: Secondary | ICD-10-CM | POA: Diagnosis not present

## 2021-06-24 DIAGNOSIS — J90 Pleural effusion, not elsewhere classified: Secondary | ICD-10-CM | POA: Diagnosis not present

## 2021-06-24 DIAGNOSIS — E440 Moderate protein-calorie malnutrition: Secondary | ICD-10-CM | POA: Diagnosis not present

## 2021-06-24 MED ORDER — HEPARIN SOD (PORK) LOCK FLUSH 100 UNIT/ML IV SOLN
250.0000 [IU] | Freq: Once | INTRAVENOUS | Status: AC | PRN
  Administered 2021-06-24: 250 [IU]

## 2021-06-24 MED ORDER — SODIUM CHLORIDE 0.9% FLUSH
10.0000 mL | INTRAVENOUS | Status: DC | PRN
  Administered 2021-06-24: 10 mL

## 2021-06-24 NOTE — Progress Notes (Signed)
The proposed treatment discussed in conference is for discussion purpose only and is not a binding recommendation.  The patients have not been physically examined, or presented with their treatment options.  Therefore, final treatment plans cannot be decided.  

## 2021-06-26 ENCOUNTER — Other Ambulatory Visit: Payer: Self-pay | Admitting: Nurse Practitioner

## 2021-06-26 MED ORDER — GABAPENTIN 300 MG PO CAPS: 300.0000 mg | ORAL_CAPSULE | Freq: Three times a day (TID) | ORAL | 0 refills | Status: DC

## 2021-06-26 MED ORDER — OXYCODONE-ACETAMINOPHEN 7.5-325 MG PO TABS: 1.0000 | ORAL_TABLET | ORAL | 0 refills | Status: DC | PRN

## 2021-06-27 ENCOUNTER — Emergency Department (HOSPITAL_COMMUNITY)
Admission: EM | Admit: 2021-06-27 | Discharge: 2021-06-27 | Disposition: A | Discharge status: Home / Self Care | Payer: BC Managed Care – PPO | Attending: Emergency Medicine | Admitting: Emergency Medicine

## 2021-06-27 ENCOUNTER — Other Ambulatory Visit: Payer: Self-pay

## 2021-06-27 ENCOUNTER — Encounter (HOSPITAL_COMMUNITY): Payer: Self-pay

## 2021-06-27 DIAGNOSIS — Z79899 Other long term (current) drug therapy: Secondary | ICD-10-CM | POA: Diagnosis not present

## 2021-06-27 DIAGNOSIS — Z760 Encounter for issue of repeat prescription: Secondary | ICD-10-CM

## 2021-06-27 DIAGNOSIS — R110 Nausea: Secondary | ICD-10-CM | POA: Diagnosis not present

## 2021-06-27 MED ORDER — OXYCODONE-ACETAMINOPHEN 7.5-325 MG PO TABS: 1.0000 | ORAL_TABLET | ORAL | 0 refills | Status: DC | PRN

## 2021-06-27 NOTE — ED Triage Notes (Signed)
Pt reports running out of pain medication and not being able to get into her doctor until Monday for a refill. Pt is a cancer pt and recently had an ostomy placed.

## 2021-06-27 NOTE — ED Notes (Signed)
Pt here for med refill on pain med

## 2021-06-27 NOTE — Discharge Instructions (Signed)
Please be sure to keep your appointment on Monday. I hope you feel better!

## 2021-06-27 NOTE — ED Provider Notes (Signed)
[facility] DEPT Provider Note   CSN: [id number] Arrival date & time: 06/27/21  2694     History  Chief Complaint  Patient presents with   Medication Refill    Mikayla Rios is a 36 y.o. female with recently diagnosed colorectal cancer due to a for a refill of her Percocet.  Patient began chemo this week and they upped her dose of Percocet.  This caused her to go through her current prescription quicker and her insurance will not cover an early refill.  They stated that she could have a new prescription filled however this was after the office closed yesterday.  Denies any fevers, chills or complications with her recently placed ostomy bag.  Has been nauseated from chemo however no other severe symptoms.    Home Medications Prior to Admission medications   Medication Sig Start Date End Date Taking? Authorizing Provider  acetaminophen (TYLENOL) 325 MG tablet Take 1-2 tablets (325-650 mg total) by mouth every 4 (four) hours as needed for mild pain. 05/14/21   Angiulli, Lavon Paganini, PA-C  amLODipine (NORVASC) 10 MG tablet Take 10 mg by mouth daily. 05/26/21   [provider]  diclofenac Sodium (VOLTAREN) 1 % GEL Apply 4 g topically 4 (four) times daily as needed (for mild pain). Patient not taking: Reported on 06/22/2021 05/14/21   Cathlyn Parsons, PA-C  DULoxetine (CYMBALTA) 30 MG capsule TAKE 1 CAPSULE BY MOUTH EVERY DAY 06/16/21   [name], MD  furosemide (LASIX) 20 MG tablet Take 1 tablet (20 mg total) by mouth daily. Patient not taking: Reported on 06/12/2021 05/14/21   Angiulli, Lavon Paganini, PA-C  gabapentin (NEURONTIN) 300 MG capsule Take 1 capsule (300 mg total) by mouth 3 (three) times daily. 06/26/21   [name], [name], NP  metoprolol tartrate (LOPRESSOR) 50 MG tablet Take 1 tablet (50 mg total) by mouth 2 (two) times daily. 05/14/21   Angiulli, Lavon Paganini, PA-C  ondansetron (ZOFRAN) 8 MG tablet Take 1 tablet (8 mg total) by mouth 2 (two)  times daily as needed for refractory nausea / vomiting. Start on day 3 after chemotherapy. 05/25/21   [name], MD  oxyCODONE-acetaminophen (PERCOCET) 7.5-325 MG tablet Take 1 tablet by mouth every 4 (four) hours as needed for up to 3 days for severe pain or moderate pain. 06/27/21 06/30/21  Melaney Tellefsen A, PA-C  pantoprazole (PROTONIX) 40 MG tablet Take 1 tablet (40 mg total) by mouth daily. Patient not taking: Reported on 06/12/2021 05/14/21   Angiulli, Lavon Paganini, PA-C  potassium chloride SA (KLOR-CON M) 20 MEQ tablet Take 1 tablet (20 mEq total) by mouth daily. Take 1 tab twice daily for 3 days then once daily 06/23/21   [name], MD  prochlorperazine (COMPAZINE) 10 MG tablet Take 1 tablet (10 mg total) by mouth every 6 (six) hours as needed (Nausea or vomiting). 05/25/21   [name], MD      Allergies    Chlorhexidine gluconate, Shellfish allergy, and Penicillins    Review of Systems   Review of Systems  Physical Exam Updated Vital Signs BP (!) 164/119 (BP Location: Left Arm) Comment: has not takem morning bp med   Pulse (!) 118    Temp 98.5 F (36.9 C) (Oral)    Resp 16    SpO2 97%  Physical Exam Vitals and nursing note reviewed.  Constitutional:      Appearance: Normal appearance.  HENT:     Head: Normocephalic and atraumatic.  Eyes:  General: No scleral icterus.    Conjunctiva/sclera: Conjunctivae normal.  Pulmonary:     Effort: Pulmonary effort is normal. No respiratory distress.  Abdominal:     General: Abdomen is flat.     Palpations: Abdomen is soft.     Comments: Left-sided ostomy bag, stool in bag, no signs of infection  Skin:    General: Skin is warm and dry.     Findings: No rash.  Neurological:     Mental Status: She is alert.  Psychiatric:        Mood and Affect: Mood normal.    ED Results / Procedures / Treatments   Labs (all labs ordered are listed, but only abnormal results are displayed) Labs Reviewed - No data to  display  EKG None  Radiology No results found.  Procedures Procedures   Medications Ordered in ED Medications - No data to display  ED Course/ Medical Decision Making/ A&P                           Medical Decision Making Risk Prescription drug management.   36 year old female presenting for medication refill.  She has an appointment with her provider on Monday.  I believe it is reasonable to give her a couple days worth of Percocet given her recent surgery and pain from her cancer.  She understands that I am unable to fill multiple days or higher doses of this prescription however anything to get her through the weekend will be  sufficient.  Percocet sent to pharmacy  Final Clinical Impression(s) / ED Diagnoses Final diagnoses:  Medication refill    Rx / DC Orders ED Discharge Orders          Ordered    oxyCODONE-acetaminophen (PERCOCET) 7.5-325 MG tablet  Every 4 hours PRN        06/27/21 0956           Results and diagnoses were explained to the patient. Return precautions discussed in full. Patient had no additional questions and expressed complete understanding.   This chart was dictated using voice recognition software.  Despite best efforts to proofread,  errors can occur which can change the documentation meaning.    Darliss Ridgel 06/27/21 1002    Godfrey Pick, MD 06/27/21 1744

## 2021-06-29 ENCOUNTER — Encounter: Payer: Self-pay | Admitting: Licensed Clinical Social Worker

## 2021-06-29 ENCOUNTER — Inpatient Hospital Stay (HOSPITAL_BASED_OUTPATIENT_CLINIC_OR_DEPARTMENT_OTHER): Payer: BC Managed Care – PPO | Admitting: Licensed Clinical Social Worker

## 2021-06-29 ENCOUNTER — Ambulatory Visit (INDEPENDENT_AMBULATORY_CARE_PROVIDER_SITE_OTHER): Payer: BC Managed Care – PPO | Admitting: Psychologist

## 2021-06-29 ENCOUNTER — Inpatient Hospital Stay (HOSPITAL_BASED_OUTPATIENT_CLINIC_OR_DEPARTMENT_OTHER): Payer: BC Managed Care – PPO | Admitting: Nurse Practitioner

## 2021-06-29 ENCOUNTER — Ambulatory Visit: Payer: BC Managed Care – PPO | Admitting: Psychologist

## 2021-06-29 ENCOUNTER — Encounter: Payer: Self-pay | Admitting: Hematology

## 2021-06-29 ENCOUNTER — Inpatient Hospital Stay: Payer: BC Managed Care – PPO

## 2021-06-29 ENCOUNTER — Encounter: Payer: Self-pay | Admitting: Nurse Practitioner

## 2021-06-29 ENCOUNTER — Other Ambulatory Visit: Payer: Self-pay

## 2021-06-29 VITALS — BP 132/91 | HR 147 | Temp 98.2°F | Resp 16 | Wt 195.3 lb

## 2021-06-29 DIAGNOSIS — K8020 Calculus of gallbladder without cholecystitis without obstruction: Secondary | ICD-10-CM | POA: Diagnosis not present

## 2021-06-29 DIAGNOSIS — K529 Noninfective gastroenteritis and colitis, unspecified: Secondary | ICD-10-CM | POA: Diagnosis not present

## 2021-06-29 DIAGNOSIS — G4730 Sleep apnea, unspecified: Secondary | ICD-10-CM | POA: Diagnosis not present

## 2021-06-29 DIAGNOSIS — Z803 Family history of malignant neoplasm of breast: Secondary | ICD-10-CM

## 2021-06-29 DIAGNOSIS — C187 Malignant neoplasm of sigmoid colon: Secondary | ICD-10-CM

## 2021-06-29 DIAGNOSIS — E669 Obesity, unspecified: Secondary | ICD-10-CM | POA: Diagnosis not present

## 2021-06-29 DIAGNOSIS — D3502 Benign neoplasm of left adrenal gland: Secondary | ICD-10-CM | POA: Diagnosis not present

## 2021-06-29 DIAGNOSIS — J90 Pleural effusion, not elsewhere classified: Secondary | ICD-10-CM | POA: Diagnosis not present

## 2021-06-29 DIAGNOSIS — Z452 Encounter for adjustment and management of vascular access device: Secondary | ICD-10-CM

## 2021-06-29 DIAGNOSIS — Z515 Encounter for palliative care: Secondary | ICD-10-CM | POA: Diagnosis not present

## 2021-06-29 DIAGNOSIS — E876 Hypokalemia: Secondary | ICD-10-CM | POA: Diagnosis not present

## 2021-06-29 DIAGNOSIS — R109 Unspecified abdominal pain: Secondary | ICD-10-CM | POA: Diagnosis not present

## 2021-06-29 DIAGNOSIS — R634 Abnormal weight loss: Secondary | ICD-10-CM | POA: Diagnosis not present

## 2021-06-29 DIAGNOSIS — F418 Other specified anxiety disorders: Secondary | ICD-10-CM | POA: Diagnosis not present

## 2021-06-29 DIAGNOSIS — Z800 Family history of malignant neoplasm of digestive organs: Secondary | ICD-10-CM

## 2021-06-29 DIAGNOSIS — D259 Leiomyoma of uterus, unspecified: Secondary | ICD-10-CM | POA: Diagnosis not present

## 2021-06-29 DIAGNOSIS — Z933 Colostomy status: Secondary | ICD-10-CM | POA: Diagnosis not present

## 2021-06-29 DIAGNOSIS — G893 Neoplasm related pain (acute) (chronic): Secondary | ICD-10-CM

## 2021-06-29 DIAGNOSIS — R531 Weakness: Secondary | ICD-10-CM | POA: Diagnosis not present

## 2021-06-29 DIAGNOSIS — R530 Neoplastic (malignant) related fatigue: Secondary | ICD-10-CM

## 2021-06-29 DIAGNOSIS — Z6837 Body mass index (BMI) 37.0-37.9, adult: Secondary | ICD-10-CM | POA: Diagnosis not present

## 2021-06-29 DIAGNOSIS — Z4801 Encounter for change or removal of surgical wound dressing: Secondary | ICD-10-CM | POA: Diagnosis not present

## 2021-06-29 DIAGNOSIS — K5792 Diverticulitis of intestine, part unspecified, without perforation or abscess without bleeding: Secondary | ICD-10-CM | POA: Diagnosis not present

## 2021-06-29 DIAGNOSIS — E440 Moderate protein-calorie malnutrition: Secondary | ICD-10-CM | POA: Diagnosis not present

## 2021-06-29 DIAGNOSIS — F419 Anxiety disorder, unspecified: Secondary | ICD-10-CM

## 2021-06-29 DIAGNOSIS — F320 Major depressive disorder, single episode, mild: Secondary | ICD-10-CM

## 2021-06-29 DIAGNOSIS — Z483 Aftercare following surgery for neoplasm: Secondary | ICD-10-CM | POA: Diagnosis not present

## 2021-06-29 DIAGNOSIS — Z5111 Encounter for antineoplastic chemotherapy: Secondary | ICD-10-CM | POA: Diagnosis not present

## 2021-06-29 DIAGNOSIS — Z79891 Long term (current) use of opiate analgesic: Secondary | ICD-10-CM | POA: Diagnosis not present

## 2021-06-29 DIAGNOSIS — C189 Malignant neoplasm of colon, unspecified: Secondary | ICD-10-CM | POA: Diagnosis not present

## 2021-06-29 DIAGNOSIS — I10 Essential (primary) hypertension: Secondary | ICD-10-CM | POA: Diagnosis not present

## 2021-06-29 DIAGNOSIS — Z7189 Other specified counseling: Secondary | ICD-10-CM

## 2021-06-29 DIAGNOSIS — Z79899 Other long term (current) drug therapy: Secondary | ICD-10-CM | POA: Diagnosis not present

## 2021-06-29 DIAGNOSIS — D62 Acute posthemorrhagic anemia: Secondary | ICD-10-CM | POA: Diagnosis not present

## 2021-06-29 DIAGNOSIS — Z9049 Acquired absence of other specified parts of digestive tract: Secondary | ICD-10-CM | POA: Diagnosis not present

## 2021-06-29 LAB — GENETIC SCREENING ORDER

## 2021-06-29 LAB — CMP (CANCER CENTER ONLY)
ALT: 5 U/L (ref 0–44)
AST: 9 U/L — ABNORMAL LOW (ref 15–41)
Albumin: 4 g/dL (ref 3.5–5.0)
Alkaline Phosphatase: 43 U/L (ref 38–126)
Anion gap: 12 (ref 5–15)
BUN: 9 mg/dL (ref 6–20)
CO2: 28 mmol/L (ref 22–32)
Calcium: 9.7 mg/dL (ref 8.9–10.3)
Chloride: 98 mmol/L (ref 98–111)
Creatinine: 0.52 mg/dL (ref 0.44–1.00)
GFR, Estimated: >60 mL/min (ref >60)
Glucose, Bld: 87 mg/dL (ref 70–99)
Potassium: 3.2 mmol/L — ABNORMAL LOW (ref 3.5–5.1)
Sodium: 138 mmol/L (ref 135–145)
Total Bilirubin: 0.3 mg/dL (ref 0.3–1.2)
Total Protein: 7.8 g/dL (ref 6.5–8.1)

## 2021-06-29 LAB — CBC WITH DIFFERENTIAL (CANCER CENTER ONLY)
Abs Immature Granulocytes: 0.06 10*3/uL (ref 0.00–0.07)
Basophils Absolute: 0 10*3/uL (ref 0.0–0.1)
Basophils Relative: 0 %
Eosinophils Absolute: 0.2 10*3/uL (ref 0.0–0.5)
Eosinophils Relative: 2 %
HCT: 31.9 % — ABNORMAL LOW (ref 36.0–46.0)
Hemoglobin: 10.4 g/dL — ABNORMAL LOW (ref 12.0–15.0)
Immature Granulocytes: 1 %
Lymphocytes Relative: 21 %
Lymphs Abs: 2.3 10*3/uL (ref 0.7–4.0)
MCH: 27.1 pg (ref 26.0–34.0)
MCHC: 32.6 g/dL (ref 30.0–36.0)
MCV: 83.1 fL (ref 80.0–100.0)
Monocytes Absolute: 0.4 10*3/uL (ref 0.1–1.0)
Monocytes Relative: 4 %
Neutro Abs: 7.7 10*3/uL (ref 1.7–7.7)
Neutrophils Relative %: 72 %
Platelet Count: 614 10*3/uL — ABNORMAL HIGH (ref 150–400)
RBC: 3.84 MIL/uL — ABNORMAL LOW (ref 3.87–5.11)
RDW: 14.3 % (ref 11.5–15.5)
WBC Count: 10.7 10*3/uL — ABNORMAL HIGH (ref 4.0–10.5)
nRBC: 0 % (ref 0.0–0.2)

## 2021-06-29 MED ORDER — MORPHINE SULFATE ER 15 MG PO TBCR: 15.0000 mg | EXTENDED_RELEASE_TABLET | Freq: Two times a day (BID) | ORAL | 0 refills | Status: DC

## 2021-06-29 MED ORDER — HEPARIN SOD (PORK) LOCK FLUSH 100 UNIT/ML IV SOLN
500.0000 [IU] | Freq: Once | INTRAVENOUS | Status: AC
  Administered 2021-06-29: 500 [IU] via INTRAVENOUS

## 2021-06-29 MED ORDER — SODIUM CHLORIDE 0.9% FLUSH
10.0000 mL | Freq: Once | INTRAVENOUS | Status: AC
  Administered 2021-06-29: 10 mL via INTRAVENOUS

## 2021-06-29 MED ORDER — GABAPENTIN 300 MG PO CAPS: ORAL_CAPSULE | ORAL | 0 refills | Status: DC

## 2021-06-29 MED ORDER — OXYCODONE-ACETAMINOPHEN 7.5-325 MG PO TABS: 1.0000 | ORAL_TABLET | ORAL | 0 refills | Status: DC | PRN

## 2021-06-29 MED ORDER — DULOXETINE HCL 30 MG PO CPEP: 30.0000 mg | ORAL_CAPSULE | Freq: Every day | ORAL | 1 refills | Status: DC

## 2021-06-29 NOTE — Progress Notes (Signed)
REFERRING PROVIDER: [name], MD [address] [place],  [place] [address]  PRIMARY PROVIDER:  [name], MD  PRIMARY REASON FOR VISIT:  1. Malignant neoplasm of sigmoid colon ([place])   2. Family history of colon cancer   3. Family history of breast cancer   4. Family history of stomach cancer      HISTORY OF PRESENT ILLNESS:   Ms. Vallely, a 36 y.o. female, was seen for a [place] cancer genetics consultation at the request of Dr. Burr Medico due to a personal and family history of cancer.  Ms. Kogler presents to clinic today to discuss the possibility of a hereditary predisposition to cancer, genetic testing, and to further clarify her future cancer risks, as well as potential cancer risks for family members.   In 2022, at the age of 71, Ms. Happel was diagnosed with colon cancer. She had left colon resection on 04/28/21. Tumor testing showed loss of [name]6 and MSI-Low.  She is currently being treated with chemotherapy.   CANCER HISTORY:  Oncology History  Malignant neoplasm of sigmoid colon [facility])   Initial Diagnosis   Malignant neoplasm of sigmoid colon ([place])   11/04/2020 Imaging   EXAM: CT ABDOMEN AND PELVIS WITHOUT CONTRAST  IMPRESSION: 1. Perforating descending colonic diverticulitis with multiple abdominopelvic gas and fluid collections, measuring up to 5.7 cm and further described above. 2. Cholelithiasis without findings of acute cholecystitis. 3. 3.6 cm benign left adrenal adenoma. 4. Leiomyomatous uterus. 5. 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   11/07/2020 Imaging   EXAM: CT ABDOMEN AND PELVIS WITHOUT CONTRAST  IMPRESSION: Continued wall thickening is seen involving descending colon suggesting infectious or inflammatory colitis or perforated diverticulitis. There is an adjacent fluid collection measuring 7.0 x 5.0 cm consistent with abscess which is  significantly enlarged compared to prior exam. Adjacent to the abscess, there is a severely thickened small bowel loop most consistent with secondary inflammation.   5.2 x 3.5 cm fluid collection is noted within the left psoas muscle consistent with abscess which is significantly enlarged compared to prior exam. 4.4 x 3.8 cm fluid collection consistent with abscess is noted in the left retroperitoneal region which is also enlarged compared to prior exam.   Fibroid uterus.   Cholelithiasis.   11/27/2020 Imaging   EXAM: CT ABDOMEN AND PELVIS WITHOUT CONTRAST  IMPRESSION: 1. Significantly interval decreased size of the previously visualized left retroperitoneal and left anterior mid abdominal fluid collections. There is persistent fat stranding and mural thickening of the mid descending colon at this level. 2. Unchanged leiomyomatous uterus. 3. Unchanged cholelithiasis.   12/11/2020 Imaging   EXAM: CT ABDOMEN AND PELVIS WITHOUT CONTRAST  IMPRESSION: 1. Stable inflammatory changes centered around the distal descending colon in the left lower abdomen. Pericolonic inflammatory changes have minimally changed since 11/27/2020. Small pocket of gas medial to the colon is probably associated with a fistula or small residual abscess collection. 2. Stable position of the two percutaneous drains. The more anterior drain may be extending through a portion of the small bowel. 3. Cholelithiasis. 4. Fibroid uterus. Cannot exclude an ovarian/adnexal cystic structure near the uterine fundus. 5. Left adrenal adenoma.   01/07/2021 Imaging   EXAM: CT ABDOMEN AND PELVIS WITH CONTRAST  IMPRESSION: 1. No new abscesses identified. Similar degree of soft tissue thickening seen in the left pericolonic region. The degree of persistent soft tissues thickening in the pericolonic space further  raises suspicions for malignancy. Further evaluation with colonoscopy should be performed. 2. Left retroperitoneal  abscess drain unchanged in position. Anterior left abdominal drain again seen terminating within small bowel loop. 3. 2.6 cm mildly sclerotic lesion noted in the L2 vertebral body. Further evaluation with contrast enhanced lumbar spine MRI should be performed.   02/04/2021 Imaging   EXAM: CT ABDOMEN AND PELVIS WITH CONTRAST  IMPRESSION: No new abdominopelvic collections or abscess development in the 1 month interval.   Left anterior drain remains within a loop of small bowel, unchanged.   Left lateral abscess drain remains in the retroperitoneal space adjacent to the iliopsoas muscle with a small amount of residual fluid and air but no measurable collection.   Stable soft tissue prominence and pericolonic strandy edema/inflammation about the left descending colon compatible with residual diverticulitis/colitis. Difficult to exclude underlying transmural lesion.   04/01/2021 Imaging   EXAM: CT ABDOMEN AND PELVIS WITH CONTRAST  IMPRESSION: There appears to be significant enhancement and wall thickening involving the descending colon with some degree of traction and involvement of adjacent small bowel loops. This is consistent with the history of diverticulitis and perforation. Stable position of percutaneous drainage catheter is seen adjacent to left psoas muscle with no significant residual fluid remaining. The other percutaneous drainage catheter that was previously noted to be within small bowel loop on prior exam in the left lower quadrant, has significantly retracted and appears to be outside of the peritoneal space at this time.   Since the prior exam, there does appear to be some degree of rotation involving mesenteric vessels and structures in the right lower quadrant, suggesting partial volvulus or malrotation. There is the interval development of several lymph nodes in this area, most likely inflammatory or reactive in etiology. Mild amount of free fluid is also noted in  the pelvis. However, no significant bowel wall thickening or dilatation is seen in this area. These results will be called to the ordering clinician or representative by the Radiologist Assistant, and communication documented in the PACS or zVision Dashboard.   Stable uterine fibroid.   Hepatic steatosis.   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   Cholelithiasis.   04/10/2021 Imaging   EXAM: CT ANGIOGRAPHY CHEST CT ABDOMEN AND PELVIS WITH CONTRAST  IMPRESSION: 1. Again seen are findings compatible with descending colon diverticulitis with perforation. Free air and inflammation have increased in the interval. 2. New lobulated enhancing fluid collection posterior to this segment of inflamed colon measuring 8.5 x 3.5 x 10.0 cm. This collection now invades the adjacent iliopsoas muscle as well as extends through the left lateral abdominal wall. The tip of the drainage catheter is in this collection. Findings are compatible with abscess. 3. Anterior left percutaneous drainage catheter tip has been pulled back and is now within the subcutaneous tissues. 4. Trace free fluid. 5. No pulmonary embolism.  No acute cardiopulmonary process. 6. Cholelithiasis. 7. Fatty infiltration of the liver.   04/15/2021 Definitive Surgery   FINAL MICROSCOPIC DIAGNOSIS:   A. SMALL BOWEL, RESECTION:  - Adenocarcinoma.  - No carcinoma identified in 1 lymph node.   B. PERFORATED LEFT COLON, RESECTION:  - Moderately differentiated colonic adenocarcinoma.  - Tumor extends into pericolonic adipose tissue, and is strongly  suggestive of invasion into small bowel.  See oncology table/comments.  - No carcinoma identified in 4 lymph nodes (0/4).  - Tubular adenoma with high-grade dysplasia, 1.  - Tubular adenomas with low grade dysplasia, 3.   Comments: The size of  the tumor is difficult to estimate secondary to the disrupted nature of the specimen, as well as the infiltrative nature of the tumor.  Tumor can  be identified as definitely invading into the pericolonic adipose tissue (block B4), but given the extreme disruption of the tissue, and the involvement of the small bowel by what appears to be a colonic adenocarcinoma, I favor perforation of the large bowel with direct invasion into the small bowel.  Accordingly, I believe this is best regarded as a pT4b lesion.   ADDENDUM:  Mismatch Repair Protein (IHC)  SUMMARY INTERPRETATION: ABNORMAL  There is loss of the major MMR protein [name]6: This indicates a high probability that a hereditary germline mutation is present and referral to genetic counseling is warranted. It is recommended that the loss of protein expression be correlated with molecular based microsatellite instability testing.   IHC EXPRESSION RESULTS  TEST           RESULT  MLH1:          Preserved nuclear expression  [name]2:          Preserved nuclear expression  [name]6:          LOSS OF NUCLEAR EXPRESSION  PMS2:          Preserved nuclear expression    04/15/2021 Cancer Staging   Staging form: Colon and Rectum, AJCC 8th Edition - Pathologic stage from 04/15/2021: Stage IIC (pT4b, pN0, cM0) - Signed by [name], MD on 06/12/2021 Stage prefix: Initial diagnosis Total positive nodes: 0 Histologic grading system: 4 grade system Histologic grade (G): G2 Residual tumor (R): R0 - None    04/29/2021 Imaging   EXAM: CT ABDOMEN AND PELVIS WITH CONTRAST  IMPRESSION: Continued left retroperitoneal abscesses inferior to the left kidney, involving the left psoas muscle and left abdominal wall musculature. Overall size is decreased since prior study. Interval removal of left lower quadrant abscess drainage catheter.   New fluid collection in the cul-de-sac and wrapping around the uterus concerning for abscess.   Cholelithiasis.   Small left pleural effusion.  Bibasilar atelectasis.   Stable left adrenal adenoma.  ADDENDUM: After discussing the case with Dr. Dwaine Gale in interventional  radiology, it was noted that the fluid in the pelvis originally thought to be in the cul-de-sac is likely within the vagina, best seen on sagittal imaging. Recommend speculum exam.   Also, in the left lateral wall in the area of prior abscess in the lateral wall abdominal musculature, some of the abdomen soft tissue appears to be enhancing. While this could be infectious, cannot exclude tumor seeding in the left lateral abdominal wall, measuring 6.9 x 3.8 cm on image 64 of series 2.   06/16/2021 Imaging   EXAM: CT ABDOMEN AND PELVIS WITH CONTRAST  IMPRESSION: Increased size of bulky soft tissue masses involving the left psoas muscle and left lateral abdominal wall soft tissues, consistent with progressive metastatic disease.   Significant progression of multiple peritoneal masses throughout the abdomen and pelvis, consistent with peritoneal carcinomatosis.   New mild retroperitoneal lymphadenopathy, consistent with metastatic disease.   Stable uterine fibroid and left adrenal adenoma.   Cholelithiasis, without evidence of cholecystitis.   06/22/2021 -  Chemotherapy   Patient is on Treatment Plan : COLORECTAL FOLFOX q14d x 6 months        RISK FACTORS:  Menarche was at age 89.  First live birth at age 2.  OCP use for approximately 1 years.  Ovaries intact: yes.  Hysterectomy: no.  Menopausal status: premenopausal.  HRT use: 0 years.  Past Medical History:  Diagnosis Date   Allergy    seasonal   Anemia    Blood infection 1985   Blood transfusion without reported diagnosis    Diverticulitis    Family history of breast cancer    Family history of colon cancer    Family history of stomach cancer    Hypertension    Obesity    Sleep apnea     Past Surgical History:  Procedure Laterality Date   COLECTOMY WITH COLOSTOMY CREATION/HARTMANN PROCEDURE N/A 04/15/2021   Procedure: COLOSTOMY CREATION/HARTMANN PROCEDURE;  Surgeon: [name], MD;  Location: [facility];  Service:  General;  Laterality: N/A;   IR CATHETER TUBE CHANGE  12/12/2020   IR CATHETER TUBE CHANGE  01/27/2021   IR CATHETER TUBE CHANGE  02/19/2021   IR CATHETER TUBE CHANGE  04/13/2021   IR RADIOLOGIST EVAL & MGMT  11/27/2020   IR RADIOLOGIST EVAL & MGMT  12/11/2020   IR RADIOLOGIST EVAL & MGMT  01/07/2021   IR RADIOLOGIST EVAL & MGMT  02/04/2021   IR SINUS/FIST TUBE CHK-NON GI  12/12/2020   IR SINUS/FIST TUBE CHK-NON GI  02/19/2021   LAPAROSCOPIC PARTIAL COLECTOMY N/A 04/15/2021   Procedure: LAPAROSCOPIC ASSISTED HARTMANN RESECTION;  Surgeon: [name], MD;  Location: [facility];  Service: General;  Laterality: N/A;    Social History   Socioeconomic History   Marital status: Married    Spouse name: Insurance underwriter   Number of children: 3   Years of education: Not on file   Highest education level: Not on file  Occupational History   Not on file  Tobacco Use   Smoking status: Never   Smokeless tobacco: Never  Vaping Use   Vaping Use: Never used  Substance and Sexual Activity   Alcohol use: Not Currently   Drug use: No   Sexual activity: Yes    Partners: Male    Birth control/protection: None  Other Topics Concern   Not on file  Social History Narrative   Not on file   Social Determinants of Health   Financial Resource Strain: High Risk   Difficulty of Paying Living Expenses: Hard  Food Insecurity: Food Insecurity Present   Worried About Running Out of Food in the Last Year: Sometimes true   Ran Out of Food in the Last Year: Sometimes true  Transportation Needs: No Transportation Needs   Lack of Transportation (Medical): No   Lack of Transportation (Non-Medical): No  Physical Activity: Not on file  Stress: Stress Concern Present   Feeling of Stress : Very much  Social Connections: Moderately Integrated   Frequency of Communication with Friends and Family: More than three times a week   Frequency of Social Gatherings with Friends and Family: More than three times a week   Attends  Religious Services: More than 4 times per year   Active Member of [employer] or Organizations: No   Attends Archivist Meetings: Never   Marital Status: Married     FAMILY HISTORY:  We obtained a detailed, 4-generation family history.  Significant diagnoses are listed below: Family History  Problem Relation Age of Onset   Cancer Mother        breast, and in remission, dx 7s   Diabetes Mother    Hypertension Mother    Colon cancer Mother        dx at age 61   Congestive Heart Failure Father  Hypertension Father    Stomach cancer Maternal Uncle    Heart disease Paternal Aunt    Cancer Paternal Uncle        colon   Diabetes Maternal Grandmother    Hypertension Maternal Grandmother    Diabetes Maternal Grandfather    Kidney disease Maternal Grandfather    Diabetes Paternal Grandmother    Cancer Paternal Grandfather        colon   Autism Son    Colon polyps Half-Sister        colectomy   Ms. Alderete has 2 sons (8 and 23) and 1 daughter (32). She has 1 maternal half sister (38) who has had a colectomy for polyps, no cancer.   Ms. Pandit's mother had breast cancer in her late 34s and was diagnosed with colon cancer about two years ago at 57. She had genetic testing that was reportedly positive for APC gene/FAP, report not available at today's visit. Patient had 6 maternal aunts/uncles. One uncle died of stomach cancer in his 48s-50s. No known cancers in cousins. Maternal grandmother is living in her 59s. Grandfather died of heart issues over the age of 56.  Ms. Froh's father is living at 61. Patient has 10 paternal aunts/uncles. One uncle had colon cancer in his 31s. No known cancers in cousins. Paternal grandmother died over 58. Grandfather had colon cancer and died over 22.   Ms. Kiehn is aware of previous family history of genetic testing for hereditary cancer risks. There is no reported Ashkenazi Jewish ancestry. There is no known consanguinity.    GENETIC COUNSELING  ASSESSMENT: Ms. Glomski is a 36 y.o. female with a personal and family history which is somewhat suggestive of a hereditary cancer syndrome and predisposition to cancer, and genetic testing of her mother which reportedly showed an APC mutation. We, therefore, discussed and recommended the following at today's visit.   DISCUSSION: We discussed that approximately 10% of colorectal cancer is hereditary. Most cases of hereditary colorectal cancer are associated with Lynch syndrome genes which we discussed given her tumor testing that showed loss of [name]6. We also discussed that she has a 50% chance to have inherited the APC mutation from her mother. We discussed familial adenomatous polyposis (FAP) in detail including cancer risks and importance of testing family members. There are other genes associated with hereditary cancer as well including . Cancers and risks are gene specific.  We discussed that testing is beneficial for several reasons including knowing about other cancer risks, identifying potential screening and risk-reduction options that may be appropriate, and to understand if other family members could be at risk for cancer and allow them to undergo genetic testing.   We reviewed the characteristics, features and inheritance patterns of hereditary cancer syndromes. We also discussed genetic testing, including the appropriate family members to test, the process of testing, insurance coverage and turn-around-time for results. We discussed the implications of a negative, positive and/or variant of uncertain significant result. We recommended Ms. Schliep pursue genetic testing for the [facility] CancerNext-Expanded+RNA gene panel.   The CancerNext-Expanded + RNAinsight gene panel offered by [employer] and includes sequencing and rearrangement analysis for the following 77 genes: IP, ALK, APC*, ATM*, AXIN2, BAP1, BARD1, BLM, BMPR1A, BRCA1*, BRCA2*, BRIP1*, CDC73, CDH1*,CDK4, CDKN1B, CDKN2A, CHEK2*, CTNNA1,  DICER1, FANCC, FH, FLCN, GALNT12, KIF1B, LZTR1, MAX, MEN1, MET, MLH1*, [name]2*, [name]3, [name]6*, MUTYH*, NBN, NF1*, NF2, NTHL1, PALB2*, PHOX2B, PMS2*, POT1, PRKAR1A, PTCH1, PTEN*, RAD51C*, RAD51D*,RB1, RECQL, RET, SDHA, SDHAF2, SDHB, SDHC, SDHD, SMAD4, SMARCA4,  SMARCB1, SMARCE1, STK11, SUFU, TMEM127, TP53*,TSC1, TSC2, VHL and XRCC2 (sequencing and deletion/duplication); EGFR, EGLN1, HOXB13, KIT, MITF, PDGFRA, POLD1 and POLE (sequencing only); EPCAM and GREM1 (deletion/duplication only).  Based on Ms. Coston's personal and family history of cancer, she meets medical criteria for genetic testing. Despite that she meets criteria, she may still have an out of pocket cost. We discussed that if her out of pocket cost for testing is over $100, the laboratory will call and confirm whether she wants to proceed with testing.  If the out of pocket cost of testing is less than $100 she will be billed by the genetic testing laboratory.   PLAN: After considering the risks, benefits, and limitations, Ms. Moritz provided informed consent to pursue genetic testing and the blood sample was sent to [facility] for analysis of the CancerNext-Expanded+RNA panel. Results should be available within approximately 2-3 weeks' time, at which point they will be disclosed by telephone to Ms. Vignola, as will any additional recommendations warranted by these results. Ms. Deakins will receive a summary of her genetic counseling visit and a copy of her results once available. This information will also be available in Epic.  Ms. Dematteo questions were answered to her satisfaction today. Our contact information was provided should additional questions or concerns arise. Thank you for the referral and allowing [place] to share in the care of your patient.   Faith Rogue, MS, [facility] Genetic Counselor [email or web address] Phone: [phone]  The patient was seen for a total of 25 minutes in face-to-face genetic counseling.  Patient was  seen with her husband, Roderick Pee. Dr. Grayland Ormond was available for discussion regarding this case.   _______________________________________________________________________ For Office Staff:  Number of people involved in session: 2 Was an Intern/ student involved with case: no

## 2021-06-29 NOTE — Progress Notes (Signed)
[facility] Counselor/Therapist Progress Note  Patient ID: Mikayla Rios, MRN: [id number],    Date: 06/29/2021  Time Spent: 10:03 am to 10:45 am; total time: 42 minutes   This session was held via in person. The patient consented to in-person therapy and was in the clinician's office. Limits of confidentiality were discussed with the patient. Patient's spouse was also present for the session.   Treatment Type: Individual Therapy  Reported Symptoms: Pain symptoms have been exacerbated over the weekend.   Mental Status Exam: Appearance:  Well Groomed     Behavior: Appropriate  Motor: Normal  Speech/Language:  Clear and Coherent  Affect: Appropriate  Mood: normal  Thought process: normal  Thought content:   WNL  Sensory/Perceptual disturbances:   WNL  Orientation: oriented to person, place, and time/date  Attention: Good  Concentration: Good  Memory: WNL  Fund of knowledge:  Good  Insight:   Good  Judgment:  Good  Impulse Control: Good   Risk Assessment: Danger to Self:  No Self-injurious Behavior: No Danger to Others: No Duty to Warn:no Physical Aggression / Violence:No  Access to Firearms a concern: No  Gang Involvement:No   Subjective: Patient described herself as experiencing uncontrolled pain and indicated that she experienced that pain throughout the weekend. She described the pain as very difficult to live with currently. From there, she also disclosed that she had a CAT scan done and that they found another mass that may explain the pain. She is scheduled for a biopsy on February 3rd. She processed her thoughts and emotions related to these events. She then voiced wanting coping strategies. After discussing, practicing, and processing coping strategies, the patient described herself as better. She was agreeable to homework and following up. She denied suicidal and homicidal ideation.   Interventions:  Worked on developing a therapeutic relationship with  the patient using active listening and reflective statements. Provided emotional support using empathy and validation. Reviewed the treatment plan with the patient. Reviewed events with the patient. Normalized and validated expressed thoughts and emotions related to pain, Cymbalta medication and the results of the scan. Used socratic questions to assist the patient. Assisted in problem solving. Informed patient would reach out to palliative care provider about pain management. Provided psychoeducation about guided imagery, defusion, mindful videos, and the calm app. Practiced and processed guided imagery and defusion. Praised patient for experiencing less distress. Assisted in problem solving. Assigned homework. Provided empathic statements. Assessed for suicidal and homicidal ideation.  Homework: Implement guided imagery and defusion  Next Session: Review homework and biopsy results. Provide emotional support  Diagnosis: F32.0 major depressive affective disorder, single episode, mild   Plan:   Client Abilities: Friendly and easy to develop rapport  Client Preferences: ACT and CBT  Client statement of Needs: Emotional support and strategies to move forward  Treatment Level: Outpatient   Goals Work through the grieving process and face reality of own death Accept emotional support from others around them Live life to the fullest, event though time may be limited Become as knowledgeable about the medical condition  Reduce fear, anxiety about the health condition  Accept the illness Accept the role of psychological and behavioral factors  Stabilize anxiety level wile increasing ability to function Learn and implement coping skills that result in a reduction of anxiety  Alleviate depressive symptoms Recognize, accept, and cope with depressive feelings Develop healthy thinking patterns Develop healthy interpersonal relationships  Objectives target date for all objectives is  06/17/2022 Identify feelings  associated with the illness Family members share with each other feelings Identify the losses or limitations that have been experienced Verbalize acceptance of the reality of the medical condition Commit to learning and implement a proactive approach to managing personal stresses Verbalize an understanding of the medical condition Work with therapist to develop a plan for coping with stress Learn and implement skills for managing stress Engage in social, productive activities that are possible Engage in faith based activities implement positive imagery Identify coping skills and sources of emotional support Patient's partner and family members verbalize their fears regarding severity of health condition Identify sources of emotional distress  Learning and implement calming skills to reduce overall anxiety Learn and implement problem solving strategies Identify and engage in pleasant activities Learning and implement personal and interpersonal skills to reduce anxiety and improve interpersonal relationships Learn to accept limitations in life and commit to tolerating, rather than avoiding, unpleasant emotions while accomplishing meaningful goals Identify major life conflicts from the past and present that form the basis for present anxiety Learn and implement behavioral strategies Verbalize an understanding and resolution of current interpersonal problems Learn and implement problem solving and decision making skills Learn and implement conflict resolution skills to resolve interpersonal problems Verbalize an understanding of healthy and unhealthy emotions verbalize insight into how past relationships may be influence current experiences with depression Use mindfulness and acceptance strategies and increase value based behavior  Increase hopeful statements about the future.   Interventions Teach about stress and ways to handle stress Assist the patient in  developing a coping action plan for stressors Conduct skills based training for coping strategies Train problem focused skills Sort out what activities the individual can do Encourage patient to rely upon his/her spiritual faith Teach the patient to use guided imagery Probe and evaluate family's ability to provide emotional support Allow family to share their fears Assist the patient in identifying, sorting through, and verbalizing the various feelings generated by his/her medical condition Meet with family members  Ask patient list out limitations  Use stress inoculation training  Use Acceptance and Commitment Therapy to help client accept uncomfortable realities in order to accomplish value-consistent goals Reinforce the client's insight into the role of his/her past emotional pain and present anxiety  Discuss examples demonstrating that unrealistic worry overestimates the probability of threats and underestimate patient's ability  Assist the patient in analyzing his or her worries Help patient understand that avoidance is reinforcing  Behavioral activation help the client explore the relationship, nature of the dispute,  Help the client develop new interpersonal skills and relationships Conduct Problem so living therapy Teach conflict resolution skills Use a process-experiential approach Conduct TLDP Conduct ACT  The patient and clinician reviewed the treatment plan on 06/29/2021.   Conception Chancy, PsyD

## 2021-06-29 NOTE — Progress Notes (Signed)
[place]  [phone] [phone]   Name: Mikayla Rios Date: 06/29/2021 MRN: [id number]  DOB: Jan 09, 1986  Patient Care Team: [name], MD as PCP - General (Internal Medicine) [name], MD as PCP - Cardiology (Cardiology) [name], [name] as Physician Assistant (Emergency Medicine) [name], MD as Consulting Physician (Oncology) [name], RN as Oncology Nurse Navigator (Oncology) [name], MD as Consulting Physician (General Surgery) [name], [name], MD as Consulting Physician (Neurology) [name], [name], MD as Consulting Physician (Obstetrics and Gynecology) [name], MD as Consulting Physician (Gastroenterology) [name], [name], NP as Nurse Practitioner (Nurse Practitioner)    INTERVAL HISTORY: Mikayla Rios is a 36 y.o. 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 Recent CT scan on 06/16/21 showed progressive metastatic disease. Palliative ask to see for symptom management.    SOCIAL HISTORY:     reports that she has never smoked. She has never used smokeless tobacco. She reports that she does not currently use alcohol. She reports that she does not use drugs.  ADVANCE DIRECTIVES:    CODE STATUS:   PAST MEDICAL HISTORY: Past Medical History:  Diagnosis Date   Allergy    seasonal   Anemia    Blood infection 1985   Blood transfusion without reported diagnosis    Diverticulitis    Family history of breast cancer    Family history of colon cancer    Family history of stomach cancer    Hypertension    Obesity    Sleep apnea     ALLERGIES:  is allergic to chlorhexidine gluconate, shellfish allergy, and penicillins.  MEDICATIONS:  Current Outpatient Medications  Medication Sig Dispense Refill   morphine (MS CONTIN) 15 MG  12 hr tablet Take 1 tablet (15 mg total) by mouth every 12 (twelve) hours. 60 tablet 0   acetaminophen (TYLENOL) 325 MG tablet Take 1-2 tablets (325-650 mg total) by mouth every 4 (four) hours as needed for mild pain.     amLODipine (NORVASC) 10 MG tablet Take 10 mg by mouth daily.     diclofenac Sodium (VOLTAREN) 1 % GEL Apply 4 g topically 4 (four) times daily as needed (for mild pain). (Patient not taking: Reported on 06/22/2021) 4 g 0   DULoxetine (CYMBALTA) 30 MG capsule Take 1 capsule (30 mg total) by mouth daily. 60 capsule 1   furosemide (LASIX) 20 MG tablet Take 1 tablet (20 mg total) by mouth daily. (Patient not taking: Reported on 06/12/2021) 30 tablet 0   gabapentin (NEURONTIN) 300 MG capsule Take 1 capsule (300 mg) by mouth twice daily and Take 2 capsules (600mg ) by mouth at bedtime. 90 capsule 0   metoprolol tartrate (LOPRESSOR) 50 MG tablet Take 1 tablet (50 mg total) by mouth 2 (two) times daily. 60 tablet 0   ondansetron (ZOFRAN) 8 MG tablet Take 1 tablet (8 mg total) by mouth 2 (two) times daily as needed for refractory nausea / vomiting. Start on day 3 after chemotherapy. 30 tablet 1   oxyCODONE-acetaminophen (PERCOCET) 7.5-325 MG tablet Take 1-2 tablets by mouth every 4 (four) hours as needed for severe pain or moderate pain. 120 tablet 0   pantoprazole (PROTONIX) 40 MG tablet Take 1 tablet (40 mg total) by mouth daily. (Patient not taking: Reported on 06/12/2021) 30 tablet 0   potassium chloride SA (KLOR-CON M) 20 MEQ tablet  Take 1 tablet (20 mEq total) by mouth daily. Take 1 tab twice daily for 3 days then once daily 30 tablet 1   prochlorperazine (COMPAZINE) 10 MG tablet Take 1 tablet (10 mg total) by mouth every 6 (six) hours as needed (Nausea or vomiting). 30 tablet 1   No current facility-administered medications for this visit.    VITAL SIGNS: BP (!) 132/91 (BP Location: Left Arm, Patient Position: Sitting)    Pulse (!) 147    Temp 98.2 F (36.8 C) (Oral)    Resp 16    Wt 195  lb 5 oz (88.6 kg)    SpO2 100%    BMI 34.60 kg/m  Filed Weights   06/29/21 1526  Weight: 195 lb 5 oz (88.6 kg)    Estimated body mass index is 34.6 kg/m as calculated from the following:   Height as of 06/12/21: 5\' 3"  (1.6 m).   Weight as of this encounter: 195 lb 5 oz (88.6 kg).   PERFORMANCE STATUS (ECOG) : 2 - Symptomatic, <50% confined to bed   Physical Exam General: NAD, in wheelchair Cardiovascular: regular rate and rhythm Pulmonary: clear ant fields Abdomen: soft, tender, + bowel sounds Neurological: Weakness but otherwise nonfocal  IMPRESSION:  Mikayla Rios presents to the clinic today for follow-up on symptom management. No acute distress noted. She does appear weak, however recently had initial chemotherapy treatment. Reports she is able to do some things around the home but is hopeful to be improve and spend some quality time with her children.   Neoplams related pain  Mikayla Rios continues to have uncontrolled pain. She reports previous regimen was providing her with some relief however does not seem as effective. She is currently taking Oxycodone 7.5 mg every 4-6hrs as needed for pain. She reports her pain will ease off however begin to escalate within 2-3 hrs. When her pain is increased she is unable to function and is tearful due to the distress. She is also taking gabapentin 600 mg QHS and 300 mg in the morning.   Given her metastatic disease and neoplasm related pain we discussed at length long-acting pain regimen with hopes of improved relief. She verbalized understanding and appreciation. Will continue her Cymbalta.   Anxiety  Mikayla Rios is emotional. She expresses ongoing anxiety regarding her diagnosis and understanding of metastatic disease. She is being followed by Mikayla Rios which she is appreciative of.   I discussed the importance of continued conversation with family and their medical providers regarding overall plan of care and treatment options, ensuring  decisions are within the context of the patients values and GOCs.  PLAN: Continue Oxycodone/APAP 7.5/325 every 4-6hrs as needed for breakthrough pain MS Contin 15 mg BID Gabapentin 300 mg BID and 600mg  QHS Cymbalta 30 mg daily I will plan to see her back in 2 weeks in collaboration with future appointments.    Patient expressed understanding and was in agreement with this plan. She also understands that She can call the clinic at any time with any questions, concerns, or complaints.   Time Total: 30 min.   Visit consisted of counseling and education dealing with the complex and emotionally intense issues of symptom management and palliative care in the setting of serious and potentially life-threatening illness.Greater than 50%  of this time was spent counseling and coordinating care related to the above assessment and plan.  Signed by: Alda Lea, AGPCNP-BC [place]

## 2021-06-29 NOTE — Progress Notes (Signed)
                Jaekwon Mcclune, PsyD 

## 2021-06-30 ENCOUNTER — Telehealth: Payer: Self-pay

## 2021-06-30 ENCOUNTER — Encounter: Payer: Self-pay | Admitting: Hematology

## 2021-06-30 ENCOUNTER — Encounter: Payer: Self-pay | Admitting: Internal Medicine

## 2021-06-30 ENCOUNTER — Other Ambulatory Visit: Payer: Self-pay

## 2021-06-30 ENCOUNTER — Other Ambulatory Visit (HOSPITAL_COMMUNITY): Payer: Self-pay

## 2021-06-30 DIAGNOSIS — I10 Essential (primary) hypertension: Secondary | ICD-10-CM | POA: Diagnosis not present

## 2021-06-30 DIAGNOSIS — Z9049 Acquired absence of other specified parts of digestive tract: Secondary | ICD-10-CM | POA: Diagnosis not present

## 2021-06-30 DIAGNOSIS — E440 Moderate protein-calorie malnutrition: Secondary | ICD-10-CM | POA: Diagnosis not present

## 2021-06-30 DIAGNOSIS — E669 Obesity, unspecified: Secondary | ICD-10-CM | POA: Diagnosis not present

## 2021-06-30 DIAGNOSIS — Z6837 Body mass index (BMI) 37.0-37.9, adult: Secondary | ICD-10-CM | POA: Diagnosis not present

## 2021-06-30 DIAGNOSIS — Z933 Colostomy status: Secondary | ICD-10-CM | POA: Diagnosis not present

## 2021-06-30 DIAGNOSIS — E876 Hypokalemia: Secondary | ICD-10-CM | POA: Diagnosis not present

## 2021-06-30 DIAGNOSIS — D62 Acute posthemorrhagic anemia: Secondary | ICD-10-CM | POA: Diagnosis not present

## 2021-06-30 DIAGNOSIS — Z4801 Encounter for change or removal of surgical wound dressing: Secondary | ICD-10-CM | POA: Diagnosis not present

## 2021-06-30 DIAGNOSIS — C189 Malignant neoplasm of colon, unspecified: Secondary | ICD-10-CM | POA: Diagnosis not present

## 2021-06-30 DIAGNOSIS — Z483 Aftercare following surgery for neoplasm: Secondary | ICD-10-CM | POA: Diagnosis not present

## 2021-06-30 DIAGNOSIS — G4730 Sleep apnea, unspecified: Secondary | ICD-10-CM | POA: Diagnosis not present

## 2021-06-30 DIAGNOSIS — Z79891 Long term (current) use of opiate analgesic: Secondary | ICD-10-CM | POA: Diagnosis not present

## 2021-06-30 DIAGNOSIS — K5792 Diverticulitis of intestine, part unspecified, without perforation or abscess without bleeding: Secondary | ICD-10-CM | POA: Diagnosis not present

## 2021-06-30 MED ORDER — HEPARIN SOD (PORK) LOCK FLUSH 100 UNIT/ML IV SOLN
INTRAVENOUS | 0 refills | Status: DC
  Filled 2021-06-30: qty 300, 15d supply, fill #0

## 2021-06-30 MED ORDER — NORMAL SALINE FLUSH 0.9 % IV SOLN
10.0000 mL | Freq: Two times a day (BID) | INTRAVENOUS | 2 refills | Status: DC
  Filled 2021-06-30: qty 1200, 30d supply, fill #0

## 2021-06-30 NOTE — Telephone Encounter (Signed)
Patient notified of prior authorization approval for Morphine Sulfate ER 15mg  Tablets. Medication is approved through 06/29/2022. Patient's Pharmacy of choice notified.

## 2021-06-30 NOTE — Progress Notes (Signed)
Spoke with Lovey Newcomer at [facility] to call in prescription for 3ml NS Syringe Flush and 69ml Heparin Flush for pt's RT DBL PICC.  Lovey Newcomer stated they will contact pt to pick up flushes.

## 2021-07-03 ENCOUNTER — Telehealth: Payer: Self-pay

## 2021-07-03 MED FILL — Dexamethasone Sodium Phosphate Inj 100 MG/10ML: INTRAMUSCULAR | Qty: 1 | Status: AC

## 2021-07-03 NOTE — Telephone Encounter (Signed)
This nurse reached out to patient related to request for letter to give to her husbands job to extend his FMLA.  This nurse called and advised patient that the letter is ready and will be a reception for pickup.  Patient acknowledged understanding.  No further questions or concerns at this time.

## 2021-07-06 ENCOUNTER — Other Ambulatory Visit: Payer: Self-pay

## 2021-07-06 ENCOUNTER — Inpatient Hospital Stay (HOSPITAL_BASED_OUTPATIENT_CLINIC_OR_DEPARTMENT_OTHER): Payer: BC Managed Care – PPO | Admitting: Hematology

## 2021-07-06 ENCOUNTER — Inpatient Hospital Stay: Payer: BC Managed Care – PPO | Admitting: Licensed Clinical Social Worker

## 2021-07-06 ENCOUNTER — Inpatient Hospital Stay: Payer: BC Managed Care – PPO

## 2021-07-06 VITALS — BP 128/95 | HR 131 | Temp 98.3°F | Ht 63.0 in | Wt 195.0 lb

## 2021-07-06 DIAGNOSIS — G4730 Sleep apnea, unspecified: Secondary | ICD-10-CM | POA: Diagnosis not present

## 2021-07-06 DIAGNOSIS — R109 Unspecified abdominal pain: Secondary | ICD-10-CM | POA: Diagnosis not present

## 2021-07-06 DIAGNOSIS — C187 Malignant neoplasm of sigmoid colon: Secondary | ICD-10-CM

## 2021-07-06 DIAGNOSIS — Z5111 Encounter for antineoplastic chemotherapy: Secondary | ICD-10-CM | POA: Diagnosis not present

## 2021-07-06 DIAGNOSIS — Z452 Encounter for adjustment and management of vascular access device: Secondary | ICD-10-CM

## 2021-07-06 DIAGNOSIS — K8020 Calculus of gallbladder without cholecystitis without obstruction: Secondary | ICD-10-CM | POA: Diagnosis not present

## 2021-07-06 DIAGNOSIS — Z79899 Other long term (current) drug therapy: Secondary | ICD-10-CM | POA: Diagnosis not present

## 2021-07-06 DIAGNOSIS — D259 Leiomyoma of uterus, unspecified: Secondary | ICD-10-CM | POA: Diagnosis not present

## 2021-07-06 DIAGNOSIS — E669 Obesity, unspecified: Secondary | ICD-10-CM | POA: Diagnosis not present

## 2021-07-06 DIAGNOSIS — D3502 Benign neoplasm of left adrenal gland: Secondary | ICD-10-CM | POA: Diagnosis not present

## 2021-07-06 DIAGNOSIS — R634 Abnormal weight loss: Secondary | ICD-10-CM | POA: Diagnosis not present

## 2021-07-06 DIAGNOSIS — J90 Pleural effusion, not elsewhere classified: Secondary | ICD-10-CM | POA: Diagnosis not present

## 2021-07-06 DIAGNOSIS — Z800 Family history of malignant neoplasm of digestive organs: Secondary | ICD-10-CM | POA: Diagnosis not present

## 2021-07-06 DIAGNOSIS — K529 Noninfective gastroenteritis and colitis, unspecified: Secondary | ICD-10-CM | POA: Diagnosis not present

## 2021-07-06 DIAGNOSIS — F418 Other specified anxiety disorders: Secondary | ICD-10-CM | POA: Diagnosis not present

## 2021-07-06 DIAGNOSIS — G893 Neoplasm related pain (acute) (chronic): Secondary | ICD-10-CM | POA: Diagnosis not present

## 2021-07-06 DIAGNOSIS — I10 Essential (primary) hypertension: Secondary | ICD-10-CM | POA: Diagnosis not present

## 2021-07-06 LAB — CBC WITH DIFFERENTIAL (CANCER CENTER ONLY)
Abs Immature Granulocytes: 0.02 10*3/uL (ref 0.00–0.07)
Basophils Absolute: 0 10*3/uL (ref 0.0–0.1)
Basophils Relative: 0 %
Eosinophils Absolute: 0.2 10*3/uL (ref 0.0–0.5)
Eosinophils Relative: 2 %
HCT: 32.5 % — ABNORMAL LOW (ref 36.0–46.0)
Hemoglobin: 10.6 g/dL — ABNORMAL LOW (ref 12.0–15.0)
Immature Granulocytes: 0 %
Lymphocytes Relative: 19 %
Lymphs Abs: 1.7 10*3/uL (ref 0.7–4.0)
MCH: 27.4 pg (ref 26.0–34.0)
MCHC: 32.6 g/dL (ref 30.0–36.0)
MCV: 84 fL (ref 80.0–100.0)
Monocytes Absolute: 0.4 10*3/uL (ref 0.1–1.0)
Monocytes Relative: 5 %
Neutro Abs: 6.4 10*3/uL (ref 1.7–7.7)
Neutrophils Relative %: 74 %
Platelet Count: 494 10*3/uL — ABNORMAL HIGH (ref 150–400)
RBC: 3.87 MIL/uL (ref 3.87–5.11)
RDW: 14.7 % (ref 11.5–15.5)
WBC Count: 8.7 10*3/uL (ref 4.0–10.5)
nRBC: 0 % (ref 0.0–0.2)

## 2021-07-06 LAB — CMP (CANCER CENTER ONLY)
ALT: 6 U/L (ref 0–44)
AST: 12 U/L — ABNORMAL LOW (ref 15–41)
Albumin: 3.8 g/dL (ref 3.5–5.0)
Alkaline Phosphatase: 43 U/L (ref 38–126)
Anion gap: 8 (ref 5–15)
BUN: 13 mg/dL (ref 6–20)
CO2: 33 mmol/L — ABNORMAL HIGH (ref 22–32)
Calcium: 9.5 mg/dL (ref 8.9–10.3)
Chloride: 99 mmol/L (ref 98–111)
Creatinine: 0.67 mg/dL (ref 0.44–1.00)
GFR, Estimated: >60 mL/min (ref >60)
Glucose, Bld: 103 mg/dL — ABNORMAL HIGH (ref 70–99)
Potassium: 3.2 mmol/L — ABNORMAL LOW (ref 3.5–5.1)
Sodium: 140 mmol/L (ref 135–145)
Total Bilirubin: 0.3 mg/dL (ref 0.3–1.2)
Total Protein: 7.5 g/dL (ref 6.5–8.1)

## 2021-07-06 MED ORDER — OXALIPLATIN CHEMO INJECTION 100 MG/20ML
85.0000 mg/m2 | Freq: Once | INTRAVENOUS | Status: AC
  Administered 2021-07-06: 165 mg via INTRAVENOUS
  Filled 2021-07-06: qty 33

## 2021-07-06 MED ORDER — DEXTROSE 5 % IV SOLN: Freq: Once | INTRAVENOUS | Status: DC

## 2021-07-06 MED ORDER — FLUOROURACIL CHEMO INJECTION 2.5 GM/50ML
400.0000 mg/m2 | Freq: Once | INTRAVENOUS | Status: AC
  Administered 2021-07-06: 800 mg via INTRAVENOUS
  Filled 2021-07-06: qty 16

## 2021-07-06 MED ORDER — HEPARIN SOD (PORK) LOCK FLUSH 100 UNIT/ML IV SOLN: 250.0000 [IU] | Freq: Once | INTRAVENOUS | Status: DC | PRN

## 2021-07-06 MED ORDER — DEXTROSE 5 % IV SOLN: Freq: Once | INTRAVENOUS | Status: AC

## 2021-07-06 MED ORDER — SODIUM CHLORIDE 0.9 % IV SOLN
10.0000 mg | Freq: Once | INTRAVENOUS | Status: AC
  Administered 2021-07-06: 10 mg via INTRAVENOUS
  Filled 2021-07-06: qty 10

## 2021-07-06 MED ORDER — SODIUM CHLORIDE 0.9 % IV SOLN
2400.0000 mg/m2 | INTRAVENOUS | Status: DC
  Administered 2021-07-06: 4750 mg via INTRAVENOUS
  Filled 2021-07-06: qty 95

## 2021-07-06 MED ORDER — LEUCOVORIN CALCIUM INJECTION 350 MG
400.0000 mg/m2 | Freq: Once | INTRAVENOUS | Status: AC
  Administered 2021-07-06: 788 mg via INTRAVENOUS
  Filled 2021-07-06: qty 39.4

## 2021-07-06 MED ORDER — PALONOSETRON HCL INJECTION 0.25 MG/5ML
0.2500 mg | Freq: Once | INTRAVENOUS | Status: AC
  Administered 2021-07-06: 0.25 mg via INTRAVENOUS
  Filled 2021-07-06: qty 5

## 2021-07-06 MED ORDER — SODIUM CHLORIDE 0.9% FLUSH: 10.0000 mL | INTRAVENOUS | Status: DC | PRN

## 2021-07-06 MED ORDER — HEPARIN SOD (PORK) LOCK FLUSH 100 UNIT/ML IV SOLN
500.0000 [IU] | Freq: Once | INTRAVENOUS | Status: AC
  Administered 2021-07-06: 500 [IU] via INTRAVENOUS

## 2021-07-06 MED ORDER — SODIUM CHLORIDE 0.9% FLUSH
10.0000 mL | Freq: Once | INTRAVENOUS | Status: AC
  Administered 2021-07-06: 10 mL via INTRAVENOUS

## 2021-07-06 NOTE — Progress Notes (Signed)
Nutrition Follow-up:  Patient with colorectal cancer s/p colostomy. Patient receiving folfox.    Met with patient during infusion.  Patient reports that her appetite is better after being on cymbalta.  Reports daily stool, more solid formed. Says that  ensure has made her feel sick on her stomach. Wants to try carnation breakfast essentials.      Medications: reviewed  Labs: reviewed  Anthropometrics:   Weight 195 lb today  193 lb 3 oz on 12/19 UBW of 300 lb on May 2022   NUTRITION DIAGNOSIS: Unintentional weight loss stable    INTERVENTION:  Encouraged good sources of protein at every meal/snack Discussed premade carnation breakfast essentials shake or powder mixed with milk.  Says that she has been able to tolerate milk in cereal following surgery.     MONITORING, EVALUATION, GOAL: weight trends, intake   NEXT VISIT: to be determined with treatment  Mikayla Rios B. Zenia Resides, [place], [place] Registered Dietitian [phone] (mobile)

## 2021-07-06 NOTE — Progress Notes (Unsigned)
Verbal order given by Dr. Burr Medico for [facility] placement d/t pt cannot afford flushes for current PICC line.

## 2021-07-06 NOTE — Progress Notes (Signed)
[place]   [phone]) [phone] Fax:([phone]) [phone]   Clinic Follow up Note   Patient Care Team: [name], MD as PCP - General (Internal Medicine) [name], MD as PCP - Cardiology (Cardiology) [name], [name] as Physician Assistant (Emergency Medicine) [name], MD as Consulting Physician (Oncology) [name], RN as Oncology Nurse Navigator (Oncology) [name], MD as Consulting Physician (General Surgery) [name], [name], MD as Consulting Physician (Neurology) [name], [name], MD as Consulting Physician (Obstetrics and Gynecology) [name], MD as Consulting Physician (Gastroenterology) [name], [name], NP as Nurse Practitioner (Nurse Practitioner)  Date of Service:  07/06/2021  CHIEF COMPLAINT: f/u of sigmoid colon cancer  CURRENT THERAPY:  To start FOLFOX 06/22/21  ASSESSMENT & PLAN:  Mikayla Rios is a 36 y.o. female with   1. 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 She was found to have diverticulitis with perforation. She also developed an abscess requiring 2 drains. She continued to have abdominal pain and drainage, undergoing drain exchange three times. -she developed worsening weakness and was admitted on 04/10/21. She was taken for emergent small bowel and left colon resection on 04/15/21 under Dr. Johney Maine. 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 Margins negative for invasive carcinoma, but low-grade dysplasia involves proximal margin. All 5 lymph nodes negative (0/5). MSI low. -repeat CT AP on 06/16/21 showed progressive metastatic disease involving soft tissue masses in left psoas muscle and left lateral abdominal wall and peritoneal masses, and new mild retroperitoneal lymphadenopathy.  -she is scheduled for CT  guided pelvic biopsy  -she started FOLFOX on 06/22/21. She tolerated the first cycle chemo well -Labs reviewed, adequate for treatment, will proceed to second cycle chemo FOLFOX today -We will repeat MSI and obtain [facility] on her biopsy sample, I may switch her to [facility] after next staging scan  -f/u in 2 weeks before cycle 3 chemo   2. Symptom Management: Abdominal pain, Weight loss -she continues to have severe pain at the incision site. She saw Dr. Johney Maine on 06/11/21, who said the pain could last up to a year due to the complexity of her surgery. -her pain is now being managed by palliative care NP Nikki   3.  Anxiety and depression -Due to the prolonged illness and multiple hospital stay since May 2022, and cancer diagnosis, she has been feeling depressed lately. -cymbalta prescribed 05/25/21   4. 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 However her MSI was low, instead of high, which is unusual for Lynch syndrome. -We will refer her to genetics due to her young age and family history of colon cancer.  -genetic counseling and lab scheduled for 06/29/21   5. Social Support -she is connected with [employer], [employer] -she is working to apply for disability. Her job ended just prior to the start of her symptoms. -she has three children-- ages 26, 35, and 51.     PLAN: -Labs reviewed, adequate for treatment, will proceed to cycle 2 chemotherapy today -Scheduled for CT guided pelvic mass biopsy later this week, will request FO on her biopsy  -f/u in 2 weeks before cycle 3 chemo    No problem-specific Assessment & Plan notes found for this encounter.   SUMMARY OF ONCOLOGIC HISTORY: Oncology History  Malignant neoplasm  of sigmoid colon [facility])   Initial Diagnosis   Malignant neoplasm of sigmoid colon ([place])   11/04/2020 Imaging   EXAM: CT ABDOMEN AND PELVIS WITHOUT  CONTRAST  IMPRESSION: 1. Perforating descending colonic diverticulitis with multiple abdominopelvic gas and fluid collections, measuring up to 5.7 cm and further described above. 2. Cholelithiasis without findings of acute cholecystitis. 3. 3.6 cm benign left adrenal adenoma. 4. Leiomyomatous uterus. 5. 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   11/07/2020 Imaging   EXAM: CT ABDOMEN AND PELVIS WITHOUT CONTRAST  IMPRESSION: Continued wall thickening is seen involving descending colon suggesting infectious or inflammatory colitis or perforated diverticulitis. There is an adjacent fluid collection measuring 7.0 x 5.0 cm consistent with abscess which is significantly enlarged compared to prior exam. Adjacent to the abscess, there is a severely thickened small bowel loop most consistent with secondary inflammation.   5.2 x 3.5 cm fluid collection is noted within the left psoas muscle consistent with abscess which is significantly enlarged compared to prior exam. 4.4 x 3.8 cm fluid collection consistent with abscess is noted in the left retroperitoneal region which is also enlarged compared to prior exam.   Fibroid uterus.   Cholelithiasis.   11/27/2020 Imaging   EXAM: CT ABDOMEN AND PELVIS WITHOUT CONTRAST  IMPRESSION: 1. Significantly interval decreased size of the previously visualized left retroperitoneal and left anterior mid abdominal fluid collections. There is persistent fat stranding and mural thickening of the mid descending colon at this level. 2. Unchanged leiomyomatous uterus. 3. Unchanged cholelithiasis.   12/11/2020 Imaging   EXAM: CT ABDOMEN AND PELVIS WITHOUT CONTRAST  IMPRESSION: 1. Stable inflammatory changes centered around the distal descending colon in the left lower abdomen. Pericolonic inflammatory changes have minimally changed since 11/27/2020. Small pocket  of gas medial to the colon is probably associated with a fistula or small residual abscess collection. 2. Stable position of the two percutaneous drains. The more anterior drain may be extending through a portion of the small bowel. 3. Cholelithiasis. 4. Fibroid uterus. Cannot exclude an ovarian/adnexal cystic structure near the uterine fundus. 5. Left adrenal adenoma.   01/07/2021 Imaging   EXAM: CT ABDOMEN AND PELVIS WITH CONTRAST  IMPRESSION: 1. No new abscesses identified. Similar degree of soft tissue thickening seen in the left pericolonic region. The degree of persistent soft tissues thickening in the pericolonic space further raises suspicions for malignancy. Further evaluation with colonoscopy should be performed. 2. Left retroperitoneal abscess drain unchanged in position. Anterior left abdominal drain again seen terminating within small bowel loop. 3. 2.6 cm mildly sclerotic lesion noted in the L2 vertebral body. Further evaluation with contrast enhanced lumbar spine MRI should be performed.   02/04/2021 Imaging   EXAM: CT ABDOMEN AND PELVIS WITH CONTRAST  IMPRESSION: No new abdominopelvic collections or abscess development in the 1 month interval.   Left anterior drain remains within a loop of small bowel, unchanged.   Left lateral abscess drain remains in the retroperitoneal space adjacent to the iliopsoas muscle with a small amount of residual fluid and air but no measurable collection.   Stable soft tissue prominence and pericolonic strandy edema/inflammation about the left descending colon compatible with residual diverticulitis/colitis. Difficult to exclude underlying transmural lesion.   04/01/2021 Imaging   EXAM: CT ABDOMEN AND PELVIS WITH CONTRAST  IMPRESSION: There appears to be significant enhancement and wall thickening involving the descending colon with some degree of traction and involvement  of adjacent small bowel loops. This is consistent  with the history of diverticulitis and perforation. Stable position of percutaneous drainage catheter is seen adjacent to left psoas muscle with no significant residual fluid remaining. The other percutaneous drainage catheter that was previously noted to be within small bowel loop on prior exam in the left lower quadrant, has significantly retracted and appears to be outside of the peritoneal space at this time.   Since the prior exam, there does appear to be some degree of rotation involving mesenteric vessels and structures in the right lower quadrant, suggesting partial volvulus or malrotation. There is the interval development of several lymph nodes in this area, most likely inflammatory or reactive in etiology. Mild amount of free fluid is also noted in the pelvis. However, no significant bowel wall thickening or dilatation is seen in this area. These results will be called to the ordering clinician or representative by the Radiologist Assistant, and communication documented in the PACS or zVision Dashboard.   Stable uterine fibroid.   Hepatic steatosis.   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   Cholelithiasis.   04/10/2021 Imaging   EXAM: CT ANGIOGRAPHY CHEST CT ABDOMEN AND PELVIS WITH CONTRAST  IMPRESSION: 1. Again seen are findings compatible with descending colon diverticulitis with perforation. Free air and inflammation have increased in the interval. 2. New lobulated enhancing fluid collection posterior to this segment of inflamed colon measuring 8.5 x 3.5 x 10.0 cm. This collection now invades the adjacent iliopsoas muscle as well as extends through the left lateral abdominal wall. The tip of the drainage catheter is in this collection. Findings are compatible with abscess. 3. Anterior left percutaneous drainage catheter tip has been pulled back and is now within the subcutaneous tissues. 4. Trace free fluid. 5. No pulmonary embolism.  No acute cardiopulmonary  process. 6. Cholelithiasis. 7. Fatty infiltration of the liver.   04/15/2021 Definitive Surgery   FINAL MICROSCOPIC DIAGNOSIS:   A. SMALL BOWEL, RESECTION:  - Adenocarcinoma.  - No carcinoma identified in 1 lymph node.   B. PERFORATED LEFT COLON, RESECTION:  - Moderately differentiated colonic adenocarcinoma.  - Tumor extends into pericolonic adipose tissue, and is strongly  suggestive of invasion into small bowel.  See oncology table/comments.  - No carcinoma identified in 4 lymph nodes (0/4).  - Tubular adenoma with high-grade dysplasia, 1.  - Tubular adenomas with low grade dysplasia, 3.   Comments: The size of the tumor is difficult to estimate secondary to the disrupted nature of the specimen, as well as the infiltrative nature of the tumor.  Tumor can be identified as definitely invading into the pericolonic adipose tissue (block B4), but given the extreme disruption of the tissue, and the involvement of the small bowel by what appears to be a colonic adenocarcinoma, I favor perforation of the large bowel with direct invasion into the small bowel.  Accordingly, I believe this is best regarded as a pT4b lesion.   ADDENDUM:  Mismatch Repair Protein (IHC)  SUMMARY INTERPRETATION: ABNORMAL  There is loss of the major MMR protein [name]6: This indicates a high probability that a hereditary germline mutation is present and referral to genetic counseling is warranted. It is recommended that the loss of protein expression be correlated with molecular based microsatellite instability testing.   IHC EXPRESSION RESULTS  TEST           RESULT  MLH1:          Preserved nuclear expression  [name]2:  Preserved nuclear expression  [name]6:          LOSS OF NUCLEAR EXPRESSION  PMS2:          Preserved nuclear expression    04/15/2021 Cancer Staging   Staging form: Colon and Rectum, AJCC 8th Edition - Pathologic stage from 04/15/2021: Stage IIC (pT4b, pN0, cM0) - Signed by [name], MD on  06/12/2021 Stage prefix: Initial diagnosis Total positive nodes: 0 Histologic grading system: 4 grade system Histologic grade (G): G2 Residual tumor (R): R0 - None    04/29/2021 Imaging   EXAM: CT ABDOMEN AND PELVIS WITH CONTRAST  IMPRESSION: Continued left retroperitoneal abscesses inferior to the left kidney, involving the left psoas muscle and left abdominal wall musculature. Overall size is decreased since prior study. Interval removal of left lower quadrant abscess drainage catheter.   New fluid collection in the cul-de-sac and wrapping around the uterus concerning for abscess.   Cholelithiasis.   Small left pleural effusion.  Bibasilar atelectasis.   Stable left adrenal adenoma.  ADDENDUM: After discussing the case with Dr. Dwaine Gale in interventional radiology, it was noted that the fluid in the pelvis originally thought to be in the cul-de-sac is likely within the vagina, best seen on sagittal imaging. Recommend speculum exam.   Also, in the left lateral wall in the area of prior abscess in the lateral wall abdominal musculature, some of the abdomen soft tissue appears to be enhancing. While this could be infectious, cannot exclude tumor seeding in the left lateral abdominal wall, measuring 6.9 x 3.8 cm on image 64 of series 2.   06/16/2021 Imaging   EXAM: CT ABDOMEN AND PELVIS WITH CONTRAST  IMPRESSION: Increased size of bulky soft tissue masses involving the left psoas muscle and left lateral abdominal wall soft tissues, consistent with progressive metastatic disease.   Significant progression of multiple peritoneal masses throughout the abdomen and pelvis, consistent with peritoneal carcinomatosis.   New mild retroperitoneal lymphadenopathy, consistent with metastatic disease.   Stable uterine fibroid and left adrenal adenoma.   Cholelithiasis, without evidence of cholecystitis.   06/22/2021 -  Chemotherapy   Patient is on Treatment Plan : COLORECTAL FOLFOX q14d  x 6 months        INTERVAL HISTORY:  Mikayla Rios is here for a follow up of sigmoid colon cancer. She was last seen by me on 06/12/21. She presents to the clinic accompanied by her husband. She tolerated first cycle chemo well, mild nausea and cold sensitivity, no neuropathy now  Abdominal pain is better controled, she is on MS contin q14h and oxycodone every 6 hours on average BM has been normal, appetite is better    All other systems were reviewed with the patient and are negative.  MEDICAL HISTORY:  Past Medical History:  Diagnosis Date   Allergy    seasonal   Anemia    Blood infection 1985   Blood transfusion without reported diagnosis    Diverticulitis    Family history of breast cancer    Family history of colon cancer    Family history of stomach cancer    Hypertension    Obesity    Sleep apnea     SURGICAL HISTORY: Past Surgical History:  Procedure Laterality Date   COLECTOMY WITH COLOSTOMY CREATION/HARTMANN PROCEDURE N/A 04/15/2021   Procedure: COLOSTOMY CREATION/HARTMANN PROCEDURE;  Surgeon: [name], MD;  Location: [facility];  Service: General;  Laterality: N/A;   IR CATHETER TUBE CHANGE  12/12/2020   IR CATHETER TUBE  CHANGE  01/27/2021   IR CATHETER TUBE CHANGE  02/19/2021   IR CATHETER TUBE CHANGE  04/13/2021   IR RADIOLOGIST EVAL & MGMT  11/27/2020   IR RADIOLOGIST EVAL & MGMT  12/11/2020   IR RADIOLOGIST EVAL & MGMT  01/07/2021   IR RADIOLOGIST EVAL & MGMT  02/04/2021   IR SINUS/FIST TUBE CHK-NON GI  12/12/2020   IR SINUS/FIST TUBE CHK-NON GI  02/19/2021   LAPAROSCOPIC PARTIAL COLECTOMY N/A 04/15/2021   Procedure: LAPAROSCOPIC ASSISTED HARTMANN RESECTION;  Surgeon: [name], MD;  Location: [facility];  Service: General;  Laterality: N/A;    I have reviewed the social history and family history with the patient and they are unchanged from previous note.  ALLERGIES:  is allergic to chlorhexidine gluconate, shellfish allergy, and  penicillins.  MEDICATIONS:  Current Outpatient Medications  Medication Sig Dispense Refill   acetaminophen (TYLENOL) 325 MG tablet Take 1-2 tablets (325-650 mg total) by mouth every 4 (four) hours as needed for mild pain.     amLODipine (NORVASC) 10 MG tablet Take 10 mg by mouth daily.     diclofenac Sodium (VOLTAREN) 1 % GEL Apply 4 g topically 4 (four) times daily as needed (for mild pain). (Patient not taking: Reported on 06/22/2021) 4 g 0   DULoxetine (CYMBALTA) 30 MG capsule Take 1 capsule (30 mg total) by mouth daily. 60 capsule 1   furosemide (LASIX) 20 MG tablet Take 1 tablet (20 mg total) by mouth daily. (Patient not taking: Reported on 06/12/2021) 30 tablet 0   gabapentin (NEURONTIN) 300 MG capsule Take 1 capsule (300 mg) by mouth twice daily and Take 2 capsules (661m) by mouth at bedtime. 90 capsule 0   heparin lock flush 100 UNIT/ML SOLN injection Use 1 syringe (into each lumen) twice daily 600 mL 0   metoprolol tartrate (LOPRESSOR) 50 MG tablet Take 1 tablet (50 mg total) by mouth 2 (two) times daily. 60 tablet 0   morphine (MS CONTIN) 15 MG 12 hr tablet Take 1 tablet (15 mg total) by mouth every 12 (twelve) hours. 60 tablet 0   ondansetron (ZOFRAN) 8 MG tablet Take 1 tablet (8 mg total) by mouth 2 (two) times daily as needed for refractory nausea / vomiting. Start on day 3 after chemotherapy. 30 tablet 1   oxyCODONE-acetaminophen (PERCOCET) 7.5-325 MG tablet Take 1-2 tablets by mouth every 4 (four) hours as needed for severe pain or moderate pain. 120 tablet 0   pantoprazole (PROTONIX) 40 MG tablet Take 1 tablet (40 mg total) by mouth daily. (Patient not taking: Reported on 06/12/2021) 30 tablet 0   potassium chloride SA (KLOR-CON M) 20 MEQ tablet Take 1 tablet (20 mEq total) by mouth daily. Take 1 tab twice daily for 3 days then once daily 30 tablet 1   prochlorperazine (COMPAZINE) 10 MG tablet Take 1 tablet (10 mg total) by mouth every 6 (six) hours as needed (Nausea or vomiting). 30  tablet 1   Sodium Chloride Flush (NORMAL SALINE FLUSH) 0.9 % SOLN Inject 10 mLs (1 syringe) into lumen twice daily as directed 1200 mL 2   No current facility-administered medications for this visit.    PHYSICAL EXAMINATION: ECOG PERFORMANCE STATUS: 2 - Symptomatic, <50% confined to bed  There were no vitals filed for this visit.  Wt Readings from Last 3 Encounters:  06/29/21 195 lb 5 oz (88.6 kg)  06/19/21 201 lb 1 oz (91.2 kg)  06/12/21 192 lb 3.2 oz (87.2 kg)  GENERAL:alert, no distress and comfortable SKIN: skin color normal, no rashes or significant lesions EYES: normal, Conjunctiva are pink and non-injected, sclera clear  NEURO: alert & oriented x 3 with fluent speech  LABORATORY DATA:  I have reviewed the data as listed CBC Latest Ref Rng & Units 06/29/2021 06/22/2021 05/11/2021  WBC 4.0 - 10.5 K/uL 10.7(H) 8.8 8.2  Hemoglobin 12.0 - 15.0 g/dL 10.4(L) 11.0(L) 10.4(L)  Hematocrit 36.0 - 46.0 % 31.9(L) 34.2(L) 32.3(L)  Platelets 150 - 400 K/uL 614(H) 583(H) 557(H)     CMP Latest Ref Rng & Units 06/29/2021 06/22/2021 06/16/2021  Glucose 70 - 99 mg/dL 87 104(H) -  BUN 6 - 20 mg/dL 9 8 -  Creatinine 0.44 - 1.00 mg/dL 0.52 0.56 0.50  Sodium 135 - 145 mmol/L 138 139 -  Potassium 3.5 - 5.1 mmol/L 3.2(L) 3.0(L) -  Chloride 98 - 111 mmol/L 98 97(L) -  CO2 22 - 32 mmol/L 28 32 -  Calcium 8.9 - 10.3 mg/dL 9.7 9.3 -  Total Protein 6.5 - 8.1 g/dL 7.8 7.6 -  Total Bilirubin 0.3 - 1.2 mg/dL 0.3 0.3 -  Alkaline Phos 38 - 126 U/L 43 38 -  AST 15 - 41 U/L 9(L) 11(L) -  ALT 0 - 44 U/L 5 5 -      RADIOGRAPHIC STUDIES: I have personally reviewed the radiological images as listed and agreed with the findings in the report. No results found.    No orders of the defined types were placed in this encounter.  All questions were answered. The patient knows to call the clinic with any problems, questions or concerns. No barriers to learning was detected. The total time spent in the  appointment was 30 minutes.     [name], MD 07/06/2021   I, Wilburn Mylar, am acting as scribe for [name], MD.   I have reviewed the above documentation for accuracy and completeness, and I agree with the above.

## 2021-07-06 NOTE — Progress Notes (Signed)
HR 131 - ok to treat per MD

## 2021-07-06 NOTE — Progress Notes (Signed)
Mikayla CSW Progress Note  Holiday representative met with patient in infusion for support check-in. Pt's spouse, Roderick Pee, was also present. Pt reports that she is feeling better this last week as she has new pain meds (still has pain, but it is more manageable and she is able to sleep).  This has enabled her to have more quality time with her family. She is happy with her connection with Dr. Michail Sermon for counseling and finds it to be helpful.  Pt asked today for contact information for [facility] with [facility]. She is working with them on application for disability and is going to request their help with food stamp application as well. No other needs at this time.    Christeen Douglas LCSW

## 2021-07-07 ENCOUNTER — Encounter: Payer: Self-pay | Admitting: Hematology

## 2021-07-07 ENCOUNTER — Other Ambulatory Visit: Payer: Self-pay | Admitting: Radiology

## 2021-07-07 ENCOUNTER — Ambulatory Visit (HOSPITAL_COMMUNITY): Payer: BC Managed Care – PPO

## 2021-07-08 ENCOUNTER — Other Ambulatory Visit: Payer: Self-pay

## 2021-07-08 ENCOUNTER — Telehealth: Payer: Self-pay | Admitting: Hematology

## 2021-07-08 ENCOUNTER — Inpatient Hospital Stay: Payer: BC Managed Care – PPO | Attending: Hematology

## 2021-07-08 ENCOUNTER — Encounter: Payer: Self-pay | Admitting: Nurse Practitioner

## 2021-07-08 ENCOUNTER — Inpatient Hospital Stay (HOSPITAL_BASED_OUTPATIENT_CLINIC_OR_DEPARTMENT_OTHER): Payer: BC Managed Care – PPO | Admitting: Nurse Practitioner

## 2021-07-08 VITALS — BP 127/87 | HR 128 | Temp 98.1°F | Resp 18 | Wt 197.6 lb

## 2021-07-08 VITALS — BP 113/84 | HR 136 | Temp 98.6°F | Resp 16

## 2021-07-08 DIAGNOSIS — R6510 Systemic inflammatory response syndrome (SIRS) of non-infectious origin without acute organ dysfunction: Secondary | ICD-10-CM | POA: Diagnosis not present

## 2021-07-08 DIAGNOSIS — I10 Essential (primary) hypertension: Secondary | ICD-10-CM | POA: Diagnosis not present

## 2021-07-08 DIAGNOSIS — G4730 Sleep apnea, unspecified: Secondary | ICD-10-CM | POA: Diagnosis not present

## 2021-07-08 DIAGNOSIS — K5903 Drug induced constipation: Secondary | ICD-10-CM

## 2021-07-08 DIAGNOSIS — E440 Moderate protein-calorie malnutrition: Secondary | ICD-10-CM | POA: Diagnosis not present

## 2021-07-08 DIAGNOSIS — C187 Malignant neoplasm of sigmoid colon: Secondary | ICD-10-CM | POA: Diagnosis not present

## 2021-07-08 DIAGNOSIS — Z4801 Encounter for change or removal of surgical wound dressing: Secondary | ICD-10-CM | POA: Diagnosis not present

## 2021-07-08 DIAGNOSIS — E669 Obesity, unspecified: Secondary | ICD-10-CM | POA: Diagnosis not present

## 2021-07-08 DIAGNOSIS — R530 Neoplastic (malignant) related fatigue: Secondary | ICD-10-CM | POA: Diagnosis not present

## 2021-07-08 DIAGNOSIS — Z9049 Acquired absence of other specified parts of digestive tract: Secondary | ICD-10-CM | POA: Diagnosis not present

## 2021-07-08 DIAGNOSIS — E876 Hypokalemia: Secondary | ICD-10-CM | POA: Diagnosis not present

## 2021-07-08 DIAGNOSIS — Z515 Encounter for palliative care: Secondary | ICD-10-CM

## 2021-07-08 DIAGNOSIS — Z933 Colostomy status: Secondary | ICD-10-CM | POA: Diagnosis not present

## 2021-07-08 DIAGNOSIS — K5792 Diverticulitis of intestine, part unspecified, without perforation or abscess without bleeding: Secondary | ICD-10-CM | POA: Diagnosis not present

## 2021-07-08 DIAGNOSIS — Z452 Encounter for adjustment and management of vascular access device: Secondary | ICD-10-CM | POA: Diagnosis not present

## 2021-07-08 DIAGNOSIS — Z800 Family history of malignant neoplasm of digestive organs: Secondary | ICD-10-CM | POA: Insufficient documentation

## 2021-07-08 DIAGNOSIS — Z5111 Encounter for antineoplastic chemotherapy: Secondary | ICD-10-CM | POA: Diagnosis not present

## 2021-07-08 DIAGNOSIS — Z79899 Other long term (current) drug therapy: Secondary | ICD-10-CM | POA: Diagnosis not present

## 2021-07-08 DIAGNOSIS — G893 Neoplasm related pain (acute) (chronic): Secondary | ICD-10-CM | POA: Diagnosis not present

## 2021-07-08 DIAGNOSIS — Z803 Family history of malignant neoplasm of breast: Secondary | ICD-10-CM | POA: Diagnosis not present

## 2021-07-08 DIAGNOSIS — Z483 Aftercare following surgery for neoplasm: Secondary | ICD-10-CM | POA: Diagnosis not present

## 2021-07-08 DIAGNOSIS — D62 Acute posthemorrhagic anemia: Secondary | ICD-10-CM | POA: Diagnosis not present

## 2021-07-08 DIAGNOSIS — D649 Anemia, unspecified: Secondary | ICD-10-CM | POA: Diagnosis not present

## 2021-07-08 DIAGNOSIS — F419 Anxiety disorder, unspecified: Secondary | ICD-10-CM | POA: Insufficient documentation

## 2021-07-08 DIAGNOSIS — Z79891 Long term (current) use of opiate analgesic: Secondary | ICD-10-CM | POA: Diagnosis not present

## 2021-07-08 DIAGNOSIS — Z6837 Body mass index (BMI) 37.0-37.9, adult: Secondary | ICD-10-CM | POA: Diagnosis not present

## 2021-07-08 DIAGNOSIS — C189 Malignant neoplasm of colon, unspecified: Secondary | ICD-10-CM | POA: Diagnosis not present

## 2021-07-08 MED ORDER — HEPARIN SOD (PORK) LOCK FLUSH 100 UNIT/ML IV SOLN: 500.0000 [IU] | Freq: Once | INTRAVENOUS | Status: DC

## 2021-07-08 MED ORDER — SODIUM CHLORIDE 0.9% FLUSH: 10.0000 mL | Freq: Once | INTRAVENOUS | Status: DC

## 2021-07-08 NOTE — Telephone Encounter (Signed)
Sch per 2/1 inbasket,pt aware °

## 2021-07-08 NOTE — Progress Notes (Signed)
Spoke with pt and spouse on 07/08/2021 while in clinic with Jobe Gibbon, NP w/Palliative Care.  Pt stated she does not want a Port at this time and is willing to pay for flushes & heparin for PICC care.  Alferd Apa, LPN called IR to cancel appt for [facility] placement on 07/17/2021.

## 2021-07-08 NOTE — Progress Notes (Signed)
[place]  [phone] Fax:(336) [phone]   Name: Mikayla Rios Date: 07/08/2021 MRN: [id number]  DOB: February 08, 1986  Patient Care Team: [name], MD as PCP - General (Internal Medicine) [name], MD as PCP - Cardiology (Cardiology) [name], [name] as Physician Assistant (Emergency Medicine) [name], MD as Consulting Physician (Oncology) [name], RN as Oncology Nurse Navigator (Oncology) [name], MD as Consulting Physician (General Surgery) [name], [name], MD as Consulting Physician (Neurology) [name], [name], MD as Consulting Physician (Obstetrics and Gynecology) [name], MD as Consulting Physician (Gastroenterology) [name], [name], NP as Nurse Practitioner (Nurse Practitioner)    INTERVAL HISTORY: Mikayla Rios is a 36 y.o. 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 Recent CT scan on 06/16/21 showed progressive metastatic disease. Palliative ask to see for symptom management.    SOCIAL HISTORY:     reports that she has never smoked. She has never used smokeless tobacco. She reports that she does not currently use alcohol. She reports that she does not use drugs.  ADVANCE DIRECTIVES:    CODE STATUS:   PAST MEDICAL HISTORY: Past Medical History:  Diagnosis Date   Allergy    seasonal   Anemia    Blood infection 1985   Blood transfusion without reported diagnosis    Diverticulitis    Family history of breast cancer    Family history of colon cancer    Family history of stomach cancer    Hypertension    Obesity    Sleep apnea     ALLERGIES:  is allergic to chlorhexidine gluconate, shellfish allergy, and penicillins.  MEDICATIONS:  Current Outpatient Medications  Medication Sig Dispense Refill   acetaminophen (TYLENOL) 325  MG tablet Take 1-2 tablets (325-650 mg total) by mouth every 4 (four) hours as needed for mild pain.     amLODipine (NORVASC) 10 MG tablet Take 10 mg by mouth daily.     diclofenac Sodium (VOLTAREN) 1 % GEL Apply 4 g topically 4 (four) times daily as needed (for mild pain). (Patient not taking: Reported on 06/22/2021) 4 g 0   DULoxetine (CYMBALTA) 30 MG capsule Take 1 capsule (30 mg total) by mouth daily. 60 capsule 1   furosemide (LASIX) 20 MG tablet Take 1 tablet (20 mg total) by mouth daily. (Patient not taking: Reported on 06/12/2021) 30 tablet 0   gabapentin (NEURONTIN) 300 MG capsule Take 1 capsule (300 mg) by mouth twice daily and Take 2 capsules (600mg ) by mouth at bedtime. 90 capsule 0   heparin lock flush 100 UNIT/ML SOLN injection Use 1 syringe (into each lumen) twice daily 600 mL 0   metoprolol tartrate (LOPRESSOR) 50 MG tablet Take 1 tablet (50 mg total) by mouth 2 (two) times daily. 60 tablet 0   morphine (MS CONTIN) 15 MG 12 hr tablet Take 1 tablet (15 mg total) by mouth every 12 (twelve) hours. 60 tablet 0   ondansetron (ZOFRAN) 8 MG tablet Take 1 tablet (8 mg total) by mouth 2 (two) times daily as needed for refractory nausea / vomiting. Start on day 3 after chemotherapy. 30 tablet 1   oxyCODONE-acetaminophen (PERCOCET) 7.5-325 MG tablet Take 1-2 tablets by mouth every 4 (four) hours as needed for severe pain or moderate pain. 120 tablet 0   pantoprazole (PROTONIX) 40 MG tablet Take 1 tablet (40 mg total) by mouth  daily. (Patient not taking: Reported on 06/12/2021) 30 tablet 0   potassium chloride SA (KLOR-CON M) 20 MEQ tablet Take 1 tablet (20 mEq total) by mouth daily. Take 1 tab twice daily for 3 days then once daily 30 tablet 1   prochlorperazine (COMPAZINE) 10 MG tablet Take 1 tablet (10 mg total) by mouth every 6 (six) hours as needed (Nausea or vomiting). 30 tablet 1   Sodium Chloride Flush (NORMAL SALINE FLUSH) 0.9 % SOLN Inject 10 mLs (1 syringe) into lumen twice daily as directed  1200 mL 2   No current facility-administered medications for this visit.    VITAL SIGNS: BP 127/87 (BP Location: Left Arm, Patient Position: Sitting)    Pulse (!) 128    Temp 98.1 F (36.7 C) (Oral)    Resp 18    Wt 197 lb 9 oz (89.6 kg)    SpO2 100%    BMI 35.00 kg/m  Filed Weights   07/08/21 1409  Weight: 197 lb 9 oz (89.6 kg)    Estimated body mass index is 35 kg/m as calculated from the following:   Height as of 07/06/21: 5\' 3"  (1.6 m).   Weight as of this encounter: 197 lb 9 oz (89.6 kg).   PERFORMANCE STATUS (ECOG) : 2 - Symptomatic, <50% confined to bed   Physical Exam General: NAD, in wheelchair Cardiovascular:Tachycardic Pulmonary: clear ant fields Abdomen: soft, tender, + bowel sounds Neurological: Weakness but otherwise nonfocal  IMPRESSION:  Mikayla Rios presents to the clinic today for follow-up on symptom management. She is due to have her 5FU pump discontinued today also. No acute distress noted. She is feeling much better. She looks more energized and comfortable compared to previous visits. States she is undergoing physical therapy once a week. She and husband are appreciative of her improvement and ability to live a "somewhat" normal life without extensive pain. Her goal continues to be able to spend time with her family and be able to participate in activities with her children.   Neoplams related pain Mikayla Rios reports her pain is much improved now that she has started on the MS Contin. She is tolerating well and not having to utilize Oxycodone for breakthrough as often. We will continue current regimen given appreciation of effectiveness and improvement in he quality of life.   Anxiety  Continues to focus on her health and life one day at at time. Focusing on the positives, time with family, and remaining hopeful. She has a strong spiritual foundation. She is being followed by Dr. Nelida Meuse which she is appreciative of.   I discussed the importance of continued  conversation with family and their medical providers regarding overall plan of care and treatment options, ensuring decisions are within the context of the patients values and GOCs.  PLAN: Continue Oxycodone/APAP 7.5/325 every 4-6hrs as needed for breakthrough pain MS Contin 15 mg BID, her pain has improved and she is tolerating well.  Gabapentin 300 mg BID and 600mg  QHS Cymbalta 30 mg daily I will plan to see her back in 3 weeks in collaboration with future appointments.    Patient expressed understanding and was in agreement with this plan. She also understands that She can call the clinic at any time with any questions, concerns, or complaints.   Time Total: 25 min.   Visit consisted of counseling and education dealing with the complex and emotionally intense issues of symptom management and palliative care in the setting of serious and potentially life-threatening illness.Greater than 50%  of this time was spent counseling and coordinating care related to the above assessment and plan.  Signed by: Alda Lea, AGPCNP-BC [place]

## 2021-07-09 ENCOUNTER — Other Ambulatory Visit: Payer: Self-pay | Admitting: Student

## 2021-07-09 ENCOUNTER — Other Ambulatory Visit (HOSPITAL_COMMUNITY): Payer: Self-pay | Admitting: Physician Assistant

## 2021-07-10 ENCOUNTER — Other Ambulatory Visit: Payer: Self-pay

## 2021-07-10 ENCOUNTER — Encounter: Payer: BC Managed Care – PPO | Admitting: Registered Nurse

## 2021-07-10 ENCOUNTER — Telehealth: Payer: Self-pay

## 2021-07-10 ENCOUNTER — Ambulatory Visit (HOSPITAL_COMMUNITY)
Admission: RE | Admit: 2021-07-10 | Discharge: 2021-07-10 | Disposition: A | Discharge status: Home / Self Care | Payer: BC Managed Care – PPO | Source: Ambulatory Visit | Attending: Hematology | Admitting: Hematology

## 2021-07-10 ENCOUNTER — Encounter (HOSPITAL_COMMUNITY): Payer: Self-pay

## 2021-07-10 ENCOUNTER — Other Ambulatory Visit: Payer: Self-pay | Admitting: Nurse Practitioner

## 2021-07-10 DIAGNOSIS — K55069 Acute infarction of intestine, part and extent unspecified: Secondary | ICD-10-CM | POA: Diagnosis not present

## 2021-07-10 DIAGNOSIS — C187 Malignant neoplasm of sigmoid colon: Secondary | ICD-10-CM | POA: Diagnosis not present

## 2021-07-10 DIAGNOSIS — R590 Localized enlarged lymph nodes: Secondary | ICD-10-CM | POA: Diagnosis not present

## 2021-07-10 DIAGNOSIS — Z85038 Personal history of other malignant neoplasm of large intestine: Secondary | ICD-10-CM | POA: Diagnosis not present

## 2021-07-10 DIAGNOSIS — C786 Secondary malignant neoplasm of retroperitoneum and peritoneum: Secondary | ICD-10-CM | POA: Diagnosis not present

## 2021-07-10 DIAGNOSIS — R1902 Left upper quadrant abdominal swelling, mass and lump: Secondary | ICD-10-CM | POA: Diagnosis not present

## 2021-07-10 LAB — CBC
HCT: 32 % — ABNORMAL LOW (ref 36.0–46.0)
Hemoglobin: 10.1 g/dL — ABNORMAL LOW (ref 12.0–15.0)
MCH: 27 pg (ref 26.0–34.0)
MCHC: 31.6 g/dL (ref 30.0–36.0)
MCV: 85.6 fL (ref 80.0–100.0)
Platelets: 488 10*3/uL — ABNORMAL HIGH (ref 150–400)
RBC: 3.74 MIL/uL — ABNORMAL LOW (ref 3.87–5.11)
RDW: 14.7 % (ref 11.5–15.5)
WBC: 6.1 10*3/uL (ref 4.0–10.5)
nRBC: 0 % (ref 0.0–0.2)

## 2021-07-10 LAB — PROTIME-INR
INR: 1 (ref 0.8–1.2)
Prothrombin Time: 13 seconds (ref 11.4–15.2)

## 2021-07-10 MED ORDER — METOPROLOL TARTRATE 50 MG PO TABS: 50.0000 mg | ORAL_TABLET | Freq: Two times a day (BID) | ORAL | 0 refills | Status: DC

## 2021-07-10 MED ORDER — FENTANYL CITRATE (PF) 100 MCG/2ML IJ SOLN
INTRAMUSCULAR | Status: AC | PRN
  Administered 2021-07-10: 50 ug via INTRAVENOUS
  Administered 2021-07-10: 25 ug via INTRAVENOUS

## 2021-07-10 MED ORDER — FENTANYL CITRATE (PF) 100 MCG/2ML IJ SOLN
INTRAMUSCULAR | Status: AC
  Filled 2021-07-10: qty 2

## 2021-07-10 MED ORDER — MIDAZOLAM HCL 2 MG/2ML IJ SOLN
INTRAMUSCULAR | Status: AC
  Filled 2021-07-10: qty 4

## 2021-07-10 MED ORDER — METOPROLOL TARTRATE 25 MG PO TABS
50.0000 mg | ORAL_TABLET | ORAL | Status: AC
  Administered 2021-07-10: 50 mg via ORAL
  Filled 2021-07-10: qty 2

## 2021-07-10 MED ORDER — HEPARIN SOD (PORK) LOCK FLUSH 100 UNIT/ML IV SOLN
250.0000 [IU] | INTRAVENOUS | Status: AC | PRN
  Administered 2021-07-10: 250 [IU]
  Filled 2021-07-10: qty 5

## 2021-07-10 MED ORDER — GABAPENTIN 300 MG PO CAPS: ORAL_CAPSULE | ORAL | 0 refills | Status: DC

## 2021-07-10 MED ORDER — SODIUM CHLORIDE 0.9 % IV SOLN: INTRAVENOUS | Status: DC

## 2021-07-10 MED ORDER — OXYCODONE-ACETAMINOPHEN 7.5-325 MG PO TABS: 1.0000 | ORAL_TABLET | ORAL | 0 refills | Status: DC | PRN

## 2021-07-10 MED ORDER — DIPHENHYDRAMINE HCL 50 MG/ML IJ SOLN
INTRAMUSCULAR | Status: AC | PRN
  Administered 2021-07-10: 25 mg via INTRAVENOUS

## 2021-07-10 MED ORDER — DIPHENHYDRAMINE HCL 50 MG/ML IJ SOLN
INTRAMUSCULAR | Status: AC
  Filled 2021-07-10: qty 1

## 2021-07-10 MED ORDER — FENTANYL CITRATE (PF) 100 MCG/2ML IJ SOLN
25.0000 ug | INTRAMUSCULAR | Status: AC
  Administered 2021-07-10: 25 ug via INTRAVENOUS

## 2021-07-10 MED ORDER — FENTANYL CITRATE PF 50 MCG/ML IJ SOSY
25.0000 ug | PREFILLED_SYRINGE | INTRAMUSCULAR | Status: AC
  Administered 2021-07-10: 25 ug via INTRAVENOUS

## 2021-07-10 MED ORDER — MIDAZOLAM HCL 2 MG/2ML IJ SOLN
INTRAMUSCULAR | Status: AC | PRN
  Administered 2021-07-10 (×3): 1 mg via INTRAVENOUS

## 2021-07-10 NOTE — Telephone Encounter (Signed)
Mikayla Rios called our office to inform us that she was due for a refill on her Percocet but that the pharmacy wasn't able to refill it. I called the pharmacy and she stated that she was able to override the prescription, but that the insurance was kicking back due to only covering a max of 8 tablets a day. I relayed this information to Gans, NP, to see if we would be able to change the prescription. I called Mikayla Rios to let her know the medication would be ready for her in about an hour. Understanding and gratitude verbalized. All questions answered. Advised to call our office with any questions/concerns.

## 2021-07-10 NOTE — Procedures (Signed)
Pre procedural Dx: Colon cancer  Post procedural Dx: Same  Technically successful CT guided biopsy of peritoneal mass within the left upper abdominal quadrant.   EBL: None.  Complications: None immediate.   Ronny Bacon, MD Pager #: [phone]

## 2021-07-10 NOTE — H&P (Signed)
Referring Physician(s): Feng,Yan  Supervising Physician: Sandi Mariscal  Patient Status:  [facility]  Chief Complaint: "I'm having a biopsy"   Subjective: Patient familiar to IR service from abdominal/retroperitoneal abscess drain placements in 2022 and PICC line placement on 06/22/2021.  She has a history of stage II sigmoid colon carcinoma.  She initially presented with diarrhea in May of last year and was found to have diverticulitis with perforation.  She developed abscesses requiring drain placements and continued to have abdominal pain and drainage ,undergoing drain exchange 3 times.  She developed worsening weakness and was admitted in November of last year and was taken for emergent small bowel and left colon resection with pathology revealing moderately differentiated colonic adenocarcinoma extending into the pericolonic adipose tissue and small bowel.  Repeat CT on 06/16/2021 showed progressive metastatic disease involving soft tissue masses in the left psoas muscle and left lateral abdominal wall and peritoneal masses as well as new mild retroperitoneal lymphadenopathy.  She presents today for image guided abdominal mass biopsy for further evaluation/MSI and foundation 1 tissue processing.  She currently denies fever, headache, chest pain, dyspnea, cough, back pain, or bleeding.  She does have some generalized abdominal discomfort and recent nausea and vomiting.  Past Medical History:  Diagnosis Date   Allergy    seasonal   Anemia    Blood infection 1985   Blood transfusion without reported diagnosis    Diverticulitis    Family history of breast cancer    Family history of colon cancer    Family history of stomach cancer    Hypertension    Obesity    Sleep apnea    Past Surgical History:  Procedure Laterality Date   COLECTOMY WITH COLOSTOMY CREATION/HARTMANN PROCEDURE N/A 04/15/2021   Procedure: COLOSTOMY CREATION/HARTMANN PROCEDURE;  Surgeon: [name], MD;  Location: [facility];  Service: General;  Laterality: N/A;   IR CATHETER TUBE CHANGE  12/12/2020   IR CATHETER TUBE CHANGE  01/27/2021   IR CATHETER TUBE CHANGE  02/19/2021   IR CATHETER TUBE CHANGE  04/13/2021   IR RADIOLOGIST EVAL & MGMT  11/27/2020   IR RADIOLOGIST EVAL & MGMT  12/11/2020   IR RADIOLOGIST EVAL & MGMT  01/07/2021   IR RADIOLOGIST EVAL & MGMT  02/04/2021   IR SINUS/FIST TUBE CHK-NON GI  12/12/2020   IR SINUS/FIST TUBE CHK-NON GI  02/19/2021   LAPAROSCOPIC PARTIAL COLECTOMY N/A 04/15/2021   Procedure: LAPAROSCOPIC ASSISTED HARTMANN RESECTION;  Surgeon: [name], MD;  Location: [facility];  Service: General;  Laterality: N/A;       Allergies: Chlorhexidine gluconate, Shellfish allergy, and Penicillins  Medications: Prior to Admission medications   Medication Sig Start Date End Date Taking? Authorizing Provider  acetaminophen (TYLENOL) 325 MG tablet Take 1-2 tablets (325-650 mg total) by mouth every 4 (four) hours as needed for mild pain. 05/14/21   Angiulli, Lavon Paganini, PA-C  amLODipine (NORVASC) 10 MG tablet Take 10 mg by mouth daily. 05/26/21   [provider]  diclofenac Sodium (VOLTAREN) 1 % GEL Apply 4 g topically 4 (four) times daily as needed (for mild pain). Patient not taking: Reported on 06/22/2021 05/14/21   Angiulli, Lavon Paganini, PA-C  DULoxetine (CYMBALTA) 30 MG capsule Take 1 capsule (30 mg total) by mouth daily. 06/29/21   [name], [name], NP  furosemide (LASIX) 20 MG tablet Take 1 tablet (20 mg total) by mouth daily. Patient not taking: Reported on 06/12/2021 05/14/21   Cathlyn Parsons, PA-C  gabapentin (NEURONTIN) 300 MG capsule Take 1 capsule (300 mg) by mouth twice daily and Take 2 capsules (648m) by mouth at bedtime. 06/29/21   [name], ACarlena Sax NP  heparin lock flush 100 UNIT/ML SOLN injection Use 1 syringe (into each lumen) twice daily 06/30/21   FTruitt Merle MD  metoprolol tartrate (LOPRESSOR) 50 MG tablet Take 1 tablet (50 mg total) by mouth  2 (two) times daily. 07/10/21   SGlendale Chard MD  morphine (MS CONTIN) 15 MG 12 hr tablet Take 1 tablet (15 mg total) by mouth every 12 (twelve) hours. 06/29/21   [name], ACarlena Sax NP  ondansetron (ZOFRAN) 8 MG tablet Take 1 tablet (8 mg total) by mouth 2 (two) times daily as needed for refractory nausea / vomiting. Start on day 3 after chemotherapy. 05/25/21   FTruitt Merle MD  oxyCODONE-acetaminophen (PERCOCET) 7.5-325 MG tablet Take 1-2 tablets by mouth every 4 (four) hours as needed for severe pain or moderate pain. 06/29/21   [name], ACarlena Sax NP  potassium chloride SA (KLOR-CON M) 20 MEQ tablet Take 1 tablet (20 mEq total) by mouth daily. Take 1 tab twice daily for 3 days then once daily 06/23/21   FTruitt Merle MD  prochlorperazine (COMPAZINE) 10 MG tablet Take 1 tablet (10 mg total) by mouth every 6 (six) hours as needed (Nausea or vomiting). 05/25/21   FTruitt Merle MD  Sodium Chloride Flush (NORMAL SALINE FLUSH) 0.9 % SOLN Inject 10 mLs (1 syringe) into lumen twice daily as directed 06/30/21   FTruitt Merle MD     Vital Signs: pending    Physical Exam awake, alert.  Chest clear to auscultation bilaterally.  Heart with tachycardic but regular rhythm.  Abdomen soft, positive bowel sounds, intact colostomy, mild generalized tenderness to palpation.  Extremities with full range of motion.  Imaging: No results found.  Labs:  CBC: Recent Labs    06/22/21 1147 06/29/21 1451 07/06/21 1214 07/10/21 0745  WBC 8.8 10.7* 8.7 6.1  HGB 11.0* 10.4* 10.6* 10.1*  HCT 34.2* 31.9* 32.5* 32.0*  PLT 583* 614* 494* 488*    COAGS: Recent Labs    11/07/20 1820 04/11/21 0302 04/19/21 1500 07/10/21 0745  INR 1.3* 1.2 1.2 1.0  APTT  --  28 35  --     BMP: Recent Labs    05/15/21 0521 06/16/21 1400 06/22/21 1147 06/29/21 1451 07/06/21 1214  NA 139  --  139 138 140  K 3.7  --  3.0* 3.2* 3.2*  CL 99  --  97* 98 99  CO2 28  --  32 28 33*  GLUCOSE 91  --  104* 87 103*  BUN 9   --  _0 CALCIUM 8.9  --  9.3 9.7 9.5  CREATININE 0.57 0.50 0.56 0.52 0.67  GFRNONAA >60  --  >60 >60 >60    LIVER FUNCTION TESTS: Recent Labs    05/14/21 0513 06/22/21 1147 06/29/21 1451 07/06/21 1214  BILITOT 0.6 0.3 0.3 0.3  AST 16 11* 9* 12*  ALT _1 ALKPHOS 35* 38 43 43  PROT 6.6 7.6 7.8 7.5  ALBUMIN 2.6* 3.7 4.0 3.8    Assessment and Plan: Patient familiar to IR service from abdominal/retroperitoneal abscess drain placements in 2022 and PICC line placement on 06/22/2021.  She has a history of stage II sigmoid colon carcinoma.  She initially presented with diarrhea in May of last year and was found to have diverticulitis with perforation.  She  developed abscesses requiring drain placements and continued to have abdominal pain and drainage ,undergoing drain exchange 3 times.  She developed worsening weakness and was admitted in November of last year and was taken for emergent small bowel and left colon resection with pathology revealing moderately differentiated colonic adenocarcinoma extending into the pericolonic adipose tissue and small bowel.  Repeat CT on 06/16/2021 showed progressive metastatic disease involving soft tissue masses in the left psoas muscle and left lateral abdominal wall and peritoneal masses as well as new mild retroperitoneal lymphadenopathy.  She presents today for image guided abdominal mass biopsy for further evaluation/MSI and foundation 1 tissue processing.Risks and benefits of procedure was discussed with the patient and/or patient's family including, but not limited to bleeding, infection, damage to adjacent structures or low yield requiring additional tests.  All of the questions were answered and there is agreement to proceed.  Consent signed and in chart.  Pt's HR today in 120's; she typically takes metoprolol 50 mg bid but has not had available for a few days; will administer dose today  Electronically Signed: D. Rowe Robert,  PA-C 07/10/2021, 8:19 AM   I spent a total of 25 Minutes at the the patient's bedside AND on the patient's hospital floor or unit, greater than 50% of which was counseling/coordinating care for image guided abdominal mass biopsy

## 2021-07-10 NOTE — Progress Notes (Signed)
Patient returned from IR procedure c/o 8/10 abdominal pain.  Rowe Robert, PA notified with orders given.

## 2021-07-10 NOTE — Discharge Instructions (Signed)
Urgent needs - Interventional Radiology on call MD [phone]  Wound - May remove dressing and shower.  Keep site clean and dry.  Replace with bandaid as needed.  Do not submerge in tub or water until site healing well.

## 2021-07-13 LAB — SURGICAL PATHOLOGY

## 2021-07-14 ENCOUNTER — Ambulatory Visit (INDEPENDENT_AMBULATORY_CARE_PROVIDER_SITE_OTHER): Payer: BC Managed Care – PPO | Admitting: Psychologist

## 2021-07-14 ENCOUNTER — Telehealth (INDEPENDENT_AMBULATORY_CARE_PROVIDER_SITE_OTHER): Payer: BC Managed Care – PPO | Admitting: Internal Medicine

## 2021-07-14 ENCOUNTER — Encounter: Payer: Self-pay | Admitting: Internal Medicine

## 2021-07-14 ENCOUNTER — Inpatient Hospital Stay (HOSPITAL_COMMUNITY): Admission: RE | Admit: 2021-07-14 | Payer: BC Managed Care – PPO | Source: Ambulatory Visit

## 2021-07-14 VITALS — BP 122/72 | HR 90 | Temp 98.6°F | Wt 197.0 lb

## 2021-07-14 DIAGNOSIS — Z79891 Long term (current) use of opiate analgesic: Secondary | ICD-10-CM | POA: Diagnosis not present

## 2021-07-14 DIAGNOSIS — Z933 Colostomy status: Secondary | ICD-10-CM | POA: Diagnosis not present

## 2021-07-14 DIAGNOSIS — F329 Major depressive disorder, single episode, unspecified: Secondary | ICD-10-CM

## 2021-07-14 DIAGNOSIS — K5792 Diverticulitis of intestine, part unspecified, without perforation or abscess without bleeding: Secondary | ICD-10-CM | POA: Diagnosis not present

## 2021-07-14 DIAGNOSIS — I10 Essential (primary) hypertension: Secondary | ICD-10-CM | POA: Diagnosis not present

## 2021-07-14 DIAGNOSIS — E669 Obesity, unspecified: Secondary | ICD-10-CM | POA: Diagnosis not present

## 2021-07-14 DIAGNOSIS — F320 Major depressive disorder, single episode, mild: Secondary | ICD-10-CM

## 2021-07-14 DIAGNOSIS — Z6837 Body mass index (BMI) 37.0-37.9, adult: Secondary | ICD-10-CM | POA: Diagnosis not present

## 2021-07-14 DIAGNOSIS — Z8616 Personal history of COVID-19: Secondary | ICD-10-CM

## 2021-07-14 DIAGNOSIS — C187 Malignant neoplasm of sigmoid colon: Secondary | ICD-10-CM | POA: Diagnosis not present

## 2021-07-14 DIAGNOSIS — E440 Moderate protein-calorie malnutrition: Secondary | ICD-10-CM | POA: Diagnosis not present

## 2021-07-14 DIAGNOSIS — E876 Hypokalemia: Secondary | ICD-10-CM | POA: Diagnosis not present

## 2021-07-14 DIAGNOSIS — G4730 Sleep apnea, unspecified: Secondary | ICD-10-CM | POA: Diagnosis not present

## 2021-07-14 DIAGNOSIS — Z9049 Acquired absence of other specified parts of digestive tract: Secondary | ICD-10-CM | POA: Diagnosis not present

## 2021-07-14 DIAGNOSIS — Z4801 Encounter for change or removal of surgical wound dressing: Secondary | ICD-10-CM | POA: Diagnosis not present

## 2021-07-14 DIAGNOSIS — C189 Malignant neoplasm of colon, unspecified: Secondary | ICD-10-CM | POA: Diagnosis not present

## 2021-07-14 DIAGNOSIS — D62 Acute posthemorrhagic anemia: Secondary | ICD-10-CM | POA: Diagnosis not present

## 2021-07-14 DIAGNOSIS — Z483 Aftercare following surgery for neoplasm: Secondary | ICD-10-CM | POA: Diagnosis not present

## 2021-07-14 MED ORDER — METOPROLOL TARTRATE 50 MG PO TABS: 50.0000 mg | ORAL_TABLET | Freq: Two times a day (BID) | ORAL | 2 refills | Status: DC

## 2021-07-14 NOTE — Progress Notes (Deleted)
Virtual Visit via ***   This visit type was conducted due to national recommendations for restrictions regarding the COVID-19 Pandemic (e.g. social distancing) in an effort to limit this patient's exposure and mitigate transmission in our community.  Due to her co-morbid illnesses, this patient is at least at moderate risk for complications without adequate follow up.  This format is felt to be most appropriate for this patient at this time.  All issues noted in this document were discussed and addressed.  A limited physical exam was performed with this format.    This visit type was conducted due to national recommendations for restrictions regarding the COVID-19 Pandemic (e.g. social distancing) in an effort to limit this patient's exposure and mitigate transmission in our community.  Patients identity confirmed using two different identifiers.  This format is felt to be most appropriate for this patient at this time.  All issues noted in this document were discussed and addressed.  No physical exam was performed (except for noted visual exam findings with Video Visits).    Date:  07/14/2021   ID:  Mikayla Rios, DOB 1985/11/02, MRN [id number]  Patient Location:  ***  Provider location:   Office    Chief Complaint:  ***  History of Present Illness:    DALIAH CHAUDOIN is a 36 y.o. female who presents via video conferencing for a telehealth visit today.  ***  The patient {does/does not:200015} have symptoms concerning for COVID-19 infection (fever, chills, cough, or new shortness of breath).   HPI   Past Medical History:  Diagnosis Date   Allergy    seasonal   Anemia    Blood infection 1985   Blood transfusion without reported diagnosis    Diverticulitis    Family history of breast cancer    Family history of colon cancer    Family history of stomach cancer    Hypertension    Obesity    Sleep apnea    Past Surgical History:  Procedure Laterality Date   COLECTOMY WITH  COLOSTOMY CREATION/HARTMANN PROCEDURE N/A 04/15/2021   Procedure: COLOSTOMY CREATION/HARTMANN PROCEDURE;  Surgeon: [name], MD;  Location: [facility];  Service: General;  Laterality: N/A;   IR CATHETER TUBE CHANGE  12/12/2020   IR CATHETER TUBE CHANGE  01/27/2021   IR CATHETER TUBE CHANGE  02/19/2021   IR CATHETER TUBE CHANGE  04/13/2021   IR RADIOLOGIST EVAL & MGMT  11/27/2020   IR RADIOLOGIST EVAL & MGMT  12/11/2020   IR RADIOLOGIST EVAL & MGMT  01/07/2021   IR RADIOLOGIST EVAL & MGMT  02/04/2021   IR SINUS/FIST TUBE CHK-NON GI  12/12/2020   IR SINUS/FIST TUBE CHK-NON GI  02/19/2021   LAPAROSCOPIC PARTIAL COLECTOMY N/A 04/15/2021   Procedure: LAPAROSCOPIC ASSISTED HARTMANN RESECTION;  Surgeon: [name], MD;  Location: [facility];  Service: General;  Laterality: N/A;     No outpatient medications have been marked as taking for the 07/14/21 encounter (Appointment) with [name], MD.     Allergies:   Chlorhexidine gluconate, Shellfish allergy, and Penicillins   Social History   Tobacco Use   Smoking status: Never   Smokeless tobacco: Never  Vaping Use   Vaping Use: Never used  Substance Use Topics   Alcohol use: Not Currently   Drug use: No     Family Hx: The patient's family history includes Autism in her son; Cancer in her mother, paternal grandfather, and paternal uncle; Colon cancer in her mother; Colon polyps in  her half-sister; Congestive Heart Failure in her father; Diabetes in her maternal grandfather, maternal grandmother, mother, and paternal grandmother; Heart disease in her paternal aunt; Hypertension in her father, maternal grandmother, and mother; Kidney disease in her maternal grandfather; Stomach cancer in her maternal uncle.  ROS:   Please see the history of present illness.    ROS  All other systems reviewed and are negative.   Labs/Other Tests and Data Reviewed:    Recent Labs: 04/29/2021: Magnesium 2.4 07/06/2021: ALT 6; BUN 13; Creatinine 0.67;  Potassium 3.2; Sodium 140 07/10/2021: Hemoglobin 10.1; Platelets 488   Recent Lipid Panel Lab Results  Component Value Date/Time   CHOL 155 10/02/2019 11:27 AM   TRIG 71 04/21/2021 06:00 AM   HDL 39 (L) 10/02/2019 11:27 AM   CHOLHDL 4.0 10/02/2019 11:27 AM   LDLCALC 99 10/02/2019 11:27 AM    Wt Readings from Last 3 Encounters:  07/08/21 197 lb 9 oz (89.6 kg)  07/06/21 195 lb (88.5 kg)  06/29/21 195 lb 5 oz (88.6 kg)     Exam:    Vital Signs:  There were no vitals taken for this visit.    Physical Exam  ASSESSMENT & PLAN:     1. Essential hypertension ***    COVID-19 Education: The signs and symptoms of COVID-19 were discussed with the patient and how to seek care for testing (follow up with PCP or arrange E-visit).  The importance of social distancing was discussed today.  Patient Risk:   After full review of this patients clinical status, I feel that they are at least moderate risk at this time.  Time:   Today, I have spent *** minutes/ seconds with the patient with telehealth technology discussing above diagnoses.     Medication Adjustments/Labs and Tests Ordered: Current medicines are reviewed at length with the patient today.  Concerns regarding medicines are outlined above.   Tests Ordered: No orders of the defined types were placed in this encounter.   Medication Changes: No orders of the defined types were placed in this encounter.   Disposition:  Follow up {follow up:15908}  Signed, Sheppard Evens [place], CMA

## 2021-07-14 NOTE — Progress Notes (Signed)
Virtual Visit via Video   This visit type was conducted due to national recommendations for restrictions regarding the COVID-19 Pandemic (e.g. social distancing) in an effort to limit this patient's exposure and mitigate transmission in our community.  Due to her co-morbid illnesses, this patient is at least at moderate risk for complications without adequate follow up.  This format is felt to be most appropriate for this patient at this time.  All issues noted in this document were discussed and addressed.  A limited physical exam was performed with this format.    This visit type was conducted due to national recommendations for restrictions regarding the COVID-19 Pandemic (e.g. social distancing) in an effort to limit this patient's exposure and mitigate transmission in our community.  Patients identity confirmed using two different identifiers.  This format is felt to be most appropriate for this patient at this time.  All issues noted in this document were discussed and addressed.  No physical exam was performed (except for noted visual exam findings with Video Visits).    Date:  07/27/2021   ID:  Mikayla Rios, Mikayla Rios 1985/07/05, MRN [id number]  Patient Location:  Home  Provider location:   Office    Chief Complaint:  "I need refills for BP meds"  History of Present Illness:    Mikayla Rios is a 36 y.o. female who presents via video conferencing for a telehealth visit today.    The patient does not have symptoms concerning for COVID-19 infection (fever, chills, cough, or new shortness of breath).   Patient presents today for HTN follow up. She reports compliance with meds. She denies headaches, chest pain and shortness of breath. She has not been seen in 2 years or more by myself, she has been seen by Minette Brine, NP.   Since her last visit, she has been diagnosed with colon cancer. Initially presented with diarrhea 11/04/20. She was found to have diverticulitis with perforation.  She also developed an abscess requiring 2 drains. She continued to have abdominal pain and drainage, undergoing drain exchange three times.  She developed worsening weakness and was admitted on 04/10/21. She was taken for emergent small bowel and left colon resection on 04/15/21 under Dr. Johney Maine. 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 Margins negative for invasive carcinoma, but low-grade dysplasia involves proximal margin. All 5 lymph nodes negative (0/5). MSI low.  Repeat CT/AP on 06/16/21 showed progressive metastatic disease involving soft tissue masses in left psoas muscle and left lateral abdominal wall and peritoneal masses, and new mild retroperitoneal lymphadenopathy. She started FOLFOX on 06/22/21. She has tolerated well so far with nausea and mild cold sensitivity.  Biopsy of the LUQ peritoneal mass on 07/10/21 confirmed metastatic adenocarcinoma with necrosis, consistent with colon primary.  She is now receiving FOLFOX.    Hypertension This is a chronic problem. The current episode started more than 1 year ago. The problem has been gradually improving since onset. The problem is controlled. Associated symptoms include malaise/fatigue.    Past Medical History:  Diagnosis Date   Allergy    seasonal   Anemia    Blood infection 1985   Blood transfusion without reported diagnosis    Diverticulitis    Family history of breast cancer    Family history of colon cancer    Family history of stomach cancer    Hypertension    Obesity    Sleep apnea    Past Surgical History:  Procedure Laterality Date   COLECTOMY WITH COLOSTOMY CREATION/HARTMANN PROCEDURE N/A 04/15/2021   Procedure: COLOSTOMY CREATION/HARTMANN PROCEDURE;  Surgeon: [name], MD;  Location: [facility];  Service: General;  Laterality: N/A;   IR CATHETER TUBE CHANGE  12/12/2020   IR CATHETER TUBE CHANGE  01/27/2021   IR CATHETER TUBE CHANGE   02/19/2021   IR CATHETER TUBE CHANGE  04/13/2021   IR RADIOLOGIST EVAL & MGMT  11/27/2020   IR RADIOLOGIST EVAL & MGMT  12/11/2020   IR RADIOLOGIST EVAL & MGMT  01/07/2021   IR RADIOLOGIST EVAL & MGMT  02/04/2021   IR SINUS/FIST TUBE CHK-NON GI  12/12/2020   IR SINUS/FIST TUBE CHK-NON GI  02/19/2021   LAPAROSCOPIC PARTIAL COLECTOMY N/A 04/15/2021   Procedure: LAPAROSCOPIC ASSISTED HARTMANN RESECTION;  Surgeon: [name], MD;  Location: [facility];  Service: General;  Laterality: N/A;     Current Meds  Medication Sig   amLODipine (NORVASC) 10 MG tablet Take 10 mg by mouth daily.   diclofenac Sodium (VOLTAREN) 1 % GEL Apply 4 g topically 4 (four) times daily as needed (for mild pain).   DULoxetine (CYMBALTA) 30 MG capsule Take 1 capsule (30 mg total) by mouth daily.   gabapentin (NEURONTIN) 300 MG capsule Take 1 capsule (300 mg) by mouth twice daily and Take 2 capsules (634m) by mouth at bedtime.   heparin lock flush 100 UNIT/ML SOLN injection Use 1 syringe (into each lumen) twice daily   ondansetron (ZOFRAN) 8 MG tablet Take 1 tablet (8 mg total) by mouth 2 (two) times daily as needed for refractory nausea / vomiting. Start on day 3 after chemotherapy.   prochlorperazine (COMPAZINE) 10 MG tablet Take 1 tablet (10 mg total) by mouth every 6 (six) hours as needed (Nausea or vomiting).   Sodium Chloride Flush (NORMAL SALINE FLUSH) 0.9 % SOLN Inject 10 mLs (1 syringe) into lumen twice daily as directed   [DISCONTINUED] metoprolol tartrate (LOPRESSOR) 50 MG tablet Take 1 tablet (50 mg total) by mouth 2 (two) times daily.   [DISCONTINUED] morphine (MS CONTIN) 15 MG 12 hr tablet Take 1 tablet (15 mg total) by mouth every 12 (twelve) hours.   [DISCONTINUED] oxyCODONE-acetaminophen (PERCOCET) 7.5-325 MG tablet Take 1-2 tablets by mouth every 4 (four) hours as needed for severe pain or moderate pain.   [DISCONTINUED] potassium chloride SA (KLOR-CON M) 20 MEQ tablet Take 1 tablet (20 mEq total) by mouth  daily. Take 1 tab twice daily for 3 days then once daily     Allergies:   Chlorhexidine gluconate, Shellfish allergy, and Penicillins   Social History   Tobacco Use   Smoking status: Never   Smokeless tobacco: Never  Vaping Use   Vaping Use: Never used  Substance Use Topics   Alcohol use: Not Currently   Drug use: No     Family Hx: The patient's family history includes Autism in her son; Cancer in her mother, paternal grandfather, and paternal uncle; Colon cancer in her mother; Colon polyps in her half-sister; Congestive Heart Failure in her father; Diabetes in her maternal grandfather, maternal grandmother, mother, and paternal grandmother; Heart disease in her paternal aunt; Hypertension in her father, maternal grandmother, and mother; Kidney disease in her maternal grandfather; Stomach cancer in her maternal uncle.  ROS:   Please see the history of present illness.    Review of Systems  Constitutional:  Positive for malaise/fatigue.  Respiratory: Negative.    Cardiovascular: Negative.   Gastrointestinal: Negative.   Neurological: Negative.  Psychiatric/Behavioral: Negative.     All other systems reviewed and are negative.   Labs/Other Tests and Data Reviewed:    Recent Labs: 04/29/2021: Magnesium 2.4 07/20/2021: ALT <5; BUN 11; Creatinine, Ser 0.38; Hemoglobin 10.8; Platelet Count 567; Potassium 2.8; Sodium 136   Recent Lipid Panel Lab Results  Component Value Date/Time   CHOL 155 10/02/2019 11:27 AM   TRIG 71 04/21/2021 06:00 AM   HDL 39 (L) 10/02/2019 11:27 AM   CHOLHDL 4.0 10/02/2019 11:27 AM   LDLCALC 99 10/02/2019 11:27 AM    Wt Readings from Last 3 Encounters:  07/20/21 190 lb 4.8 oz (86.3 kg)  07/14/21 197 lb (89.4 kg)  07/08/21 197 lb 9 oz (89.6 kg)     Exam:    Vital Signs:  BP 122/72    Pulse 90    Temp 98.6 F (37 C) (Oral)    Wt 197 lb (89.4 kg)    BMI 34.90 kg/m     Physical Exam Vitals and nursing note reviewed.  Constitutional:       Appearance: She is obese.  HENT:     Head: Normocephalic and atraumatic.  Eyes:     Extraocular Movements: Extraocular movements intact.  Pulmonary:     Effort: Pulmonary effort is normal.  Musculoskeletal:     Cervical back: Normal range of motion.  Neurological:     Mental Status: She is alert and oriented to person, place, and time.  Psychiatric:        Mood and Affect: Affect normal.    ASSESSMENT & PLAN:    1. Essential hypertension Comments: Chronic, well controlled. No med changes. She is encouraged to follow low sodium diet. Refills for metoprolol sent to pharmacy.   2. Reactive depression Comments: She is currently on duloxetine. She will consider dose adjustment in the future.   3. Malignant neoplasm of sigmoid colon ([place]) Comments: As per Oncology, notes reviewed in detail. She is currently on active FOLFOX treatment.  4. Personal history of COVID-19 Comments: She was diagnosed May 2022.  She is fully vaccinated, not yet boosted.   COVID-19 Education: The signs and symptoms of COVID-19 were discussed with the patient and how to seek care for testing (follow up with PCP or arrange E-visit).  The importance of social distancing was discussed today.  Patient Risk:   After full review of this patients clinical status, I feel that they are at least moderate risk at this time.  Time:   Today, I have spent 15 minutes/ seconds with the patient with telehealth technology discussing above diagnoses.    I personally spent 25 minutes face-to-face and non-face-to-face in the care of this patient, which includes all pre-, intra-, and post visit time on the date of service.  Medication Adjustments/Labs and Tests Ordered: Current medicines are reviewed at length with the patient today.  Concerns regarding medicines are outlined above.   Tests Ordered: No orders of the defined types were placed in this encounter.   Medication Changes: Meds ordered this encounter   Medications   metoprolol tartrate (LOPRESSOR) 50 MG tablet    Sig: Take 1 tablet (50 mg total) by mouth 2 (two) times daily.    Dispense:  180 tablet    Refill:  2    Disposition:  Follow up in 4 month(s)  Signed, Maximino Greenland, MD

## 2021-07-14 NOTE — Patient Instructions (Signed)

## 2021-07-14 NOTE — Progress Notes (Signed)
[facility] Counselor/Therapist Progress Note  Patient ID: Mikayla Rios, MRN: [id number],    Date: 07/14/2021  Time Spent: 3:05 pm to 3:25 pm; total time: 20 minutes  This session was held via video webex teletherapy due to the coronavirus risk at this time. The patient consented to video teletherapy and was located at her home during this session. She is aware it is the responsibility of the patient to secure confidentiality on her end of the session. The provider was in a private home office for the duration of this session. Limits of confidentiality were discussed with the patient.   Treatment Type: Individual Therapy  Reported Symptoms: Experiencing less depressive symptoms.    Mental Status Exam: Appearance:  Well Groomed     Behavior: Appropriate  Motor: Normal  Speech/Language:  Clear and Coherent  Affect: Appropriate  Mood: normal  Thought process: normal  Thought content:   WNL  Sensory/Perceptual disturbances:   WNL  Orientation: oriented to person, place, and time/date  Attention: Good  Concentration: Good  Memory: WNL  Fund of knowledge:  Good  Insight:   Good  Judgment:  Good  Impulse Control: Good   Risk Assessment: Danger to Self:  No Self-injurious Behavior: No Danger to Others: No Duty to Warn:no Physical Aggression / Violence:No  Access to Firearms a concern: No  Gang Involvement:No   Subjective: Patient described herself as doing better since her pain is under much better control. She processed events since the last session including having a biopsy done. Patient meets with Dr. Burr Medico on Monday to review those biopsy results. Patient also stated that she has used guided imagery strategies and indicated that they have been helpful. From there, she talked about how she and her family are living a normal life and working on not allowing their lives to be defined by her cancer diagnosis and treatment. She processed thoughts and emotions. She asked  to follow up after meeting with Dr. Burr Medico. She denied suicidal and homicidal ideation.   Interventions:  Worked on developing a therapeutic relationship with the patient using active listening and reflective statements. Provided emotional support using empathy and validation. Used summary statements. Praised patient for doing better and experiencing better pain management. Processed expressed thoughts and emotions. Explored feelings related to upcoming appointment with Dr. Burr Medico to review biopsy results. Normalized and validated thoughts. Used socratic questions to assist the patient gain insight into self. Used a Product/process development scientist of life as a book to assist the patient. Explored how patient and family were currently functioning. Praised patient for experiencing benefit from guided imagery. Discussed next steps. Provided empathic statements. Assessed for suicidal and homicidal ideation.  Homework: Continue using coping strategies  Next Session: Review appointment with Dr. Burr Medico. Emotional support  Diagnosis: F32.0 major depressive affective disorder, single episode, mild   Plan:   Client Abilities: Friendly and easy to develop rapport  Client Preferences: ACT and CBT  Client statement of Needs: Emotional support and strategies to move forward  Treatment Level: Outpatient   Goals Work through the grieving process and face reality of own death Accept emotional support from others around them Live life to the fullest, event though time may be limited Become as knowledgeable about the medical condition  Reduce fear, anxiety about the health condition  Accept the illness Accept the role of psychological and behavioral factors  Stabilize anxiety level wile increasing ability to function Learn and implement coping skills that result in a reduction of anxiety  Alleviate depressive  symptoms Recognize, accept, and cope with depressive feelings Develop healthy thinking patterns Develop healthy  interpersonal relationships  Objectives target date for all objectives is 06/17/2022 Identify feelings associated with the illness Family members share with each other feelings Identify the losses or limitations that have been experienced Verbalize acceptance of the reality of the medical condition Commit to learning and implement a proactive approach to managing personal stresses Verbalize an understanding of the medical condition Work with therapist to develop a plan for coping with stress Learn and implement skills for managing stress Engage in social, productive activities that are possible Engage in faith based activities implement positive imagery Identify coping skills and sources of emotional support Patient's partner and family members verbalize their fears regarding severity of health condition Identify sources of emotional distress  Learning and implement calming skills to reduce overall anxiety Learn and implement problem solving strategies Identify and engage in pleasant activities Learning and implement personal and interpersonal skills to reduce anxiety and improve interpersonal relationships Learn to accept limitations in life and commit to tolerating, rather than avoiding, unpleasant emotions while accomplishing meaningful goals Identify major life conflicts from the past and present that form the basis for present anxiety Learn and implement behavioral strategies Verbalize an understanding and resolution of current interpersonal problems Learn and implement problem solving and decision making skills Learn and implement conflict resolution skills to resolve interpersonal problems Verbalize an understanding of healthy and unhealthy emotions verbalize insight into how past relationships may be influence current experiences with depression Use mindfulness and acceptance strategies and increase value based behavior  Increase hopeful statements about the future.    Interventions Teach about stress and ways to handle stress Assist the patient in developing a coping action plan for stressors Conduct skills based training for coping strategies Train problem focused skills Sort out what activities the individual can do Encourage patient to rely upon his/her spiritual faith Teach the patient to use guided imagery Probe and evaluate family's ability to provide emotional support Allow family to share their fears Assist the patient in identifying, sorting through, and verbalizing the various feelings generated by his/her medical condition Meet with family members  Ask patient list out limitations  Use stress inoculation training  Use Acceptance and Commitment Therapy to help client accept uncomfortable realities in order to accomplish value-consistent goals Reinforce the client's insight into the role of his/her past emotional pain and present anxiety  Discuss examples demonstrating that unrealistic worry overestimates the probability of threats and underestimate patient's ability  Assist the patient in analyzing his or her worries Help patient understand that avoidance is reinforcing  Behavioral activation help the client explore the relationship, nature of the dispute,  Help the client develop new interpersonal skills and relationships Conduct Problem so living therapy Teach conflict resolution skills Use a process-experiential approach Conduct TLDP Conduct ACT  The patient and clinician reviewed the treatment plan on 06/29/2021.   Conception Chancy, PsyD                  Conception Chancy, PsyD

## 2021-07-15 ENCOUNTER — Telehealth: Payer: Self-pay

## 2021-07-15 ENCOUNTER — Encounter: Payer: BC Managed Care – PPO | Admitting: Registered Nurse

## 2021-07-15 DIAGNOSIS — Z483 Aftercare following surgery for neoplasm: Secondary | ICD-10-CM | POA: Diagnosis not present

## 2021-07-15 DIAGNOSIS — Z79891 Long term (current) use of opiate analgesic: Secondary | ICD-10-CM | POA: Diagnosis not present

## 2021-07-15 DIAGNOSIS — I10 Essential (primary) hypertension: Secondary | ICD-10-CM | POA: Diagnosis not present

## 2021-07-15 DIAGNOSIS — Z933 Colostomy status: Secondary | ICD-10-CM | POA: Diagnosis not present

## 2021-07-15 DIAGNOSIS — D62 Acute posthemorrhagic anemia: Secondary | ICD-10-CM | POA: Diagnosis not present

## 2021-07-15 DIAGNOSIS — Z9049 Acquired absence of other specified parts of digestive tract: Secondary | ICD-10-CM | POA: Diagnosis not present

## 2021-07-15 DIAGNOSIS — E876 Hypokalemia: Secondary | ICD-10-CM | POA: Diagnosis not present

## 2021-07-15 DIAGNOSIS — Z4801 Encounter for change or removal of surgical wound dressing: Secondary | ICD-10-CM | POA: Diagnosis not present

## 2021-07-15 DIAGNOSIS — Z6837 Body mass index (BMI) 37.0-37.9, adult: Secondary | ICD-10-CM | POA: Diagnosis not present

## 2021-07-15 DIAGNOSIS — G4730 Sleep apnea, unspecified: Secondary | ICD-10-CM | POA: Diagnosis not present

## 2021-07-15 DIAGNOSIS — E669 Obesity, unspecified: Secondary | ICD-10-CM | POA: Diagnosis not present

## 2021-07-15 DIAGNOSIS — E440 Moderate protein-calorie malnutrition: Secondary | ICD-10-CM | POA: Diagnosis not present

## 2021-07-15 DIAGNOSIS — K5792 Diverticulitis of intestine, part unspecified, without perforation or abscess without bleeding: Secondary | ICD-10-CM | POA: Diagnosis not present

## 2021-07-15 DIAGNOSIS — C189 Malignant neoplasm of colon, unspecified: Secondary | ICD-10-CM | POA: Diagnosis not present

## 2021-07-15 NOTE — Telephone Encounter (Signed)
[facility] called requesting verbal orders to continue physical therapy once a week for 4 weeks. [phone]  I returned her call and gave verbal order ok. YL,RMA

## 2021-07-17 ENCOUNTER — Ambulatory Visit (HOSPITAL_COMMUNITY): Payer: BC Managed Care – PPO

## 2021-07-17 ENCOUNTER — Other Ambulatory Visit (HOSPITAL_COMMUNITY): Payer: BC Managed Care – PPO

## 2021-07-17 MED FILL — Dexamethasone Sodium Phosphate Inj 100 MG/10ML: INTRAMUSCULAR | Qty: 1 | Status: AC

## 2021-07-18 ENCOUNTER — Other Ambulatory Visit: Payer: Self-pay | Admitting: Hematology

## 2021-07-20 ENCOUNTER — Emergency Department (HOSPITAL_COMMUNITY)
Admission: EM | Admit: 2021-07-20 | Discharge: 2021-07-21 | Disposition: A | Discharge status: Home / Self Care | Payer: BC Managed Care – PPO | Attending: Emergency Medicine | Admitting: Emergency Medicine

## 2021-07-20 ENCOUNTER — Other Ambulatory Visit: Payer: Self-pay

## 2021-07-20 ENCOUNTER — Inpatient Hospital Stay (HOSPITAL_BASED_OUTPATIENT_CLINIC_OR_DEPARTMENT_OTHER): Payer: BC Managed Care – PPO | Admitting: Hematology

## 2021-07-20 ENCOUNTER — Encounter: Payer: Self-pay | Admitting: Hematology

## 2021-07-20 ENCOUNTER — Encounter (HOSPITAL_COMMUNITY): Payer: Self-pay

## 2021-07-20 ENCOUNTER — Encounter: Payer: Self-pay | Admitting: Nurse Practitioner

## 2021-07-20 ENCOUNTER — Inpatient Hospital Stay: Payer: BC Managed Care – PPO

## 2021-07-20 ENCOUNTER — Inpatient Hospital Stay (HOSPITAL_BASED_OUTPATIENT_CLINIC_OR_DEPARTMENT_OTHER): Payer: BC Managed Care – PPO | Admitting: Nurse Practitioner

## 2021-07-20 VITALS — BP 138/99 | HR 107 | Temp 98.8°F | Resp 17 | Ht 63.0 in | Wt 190.3 lb

## 2021-07-20 VITALS — BP 136/86 | HR 95

## 2021-07-20 DIAGNOSIS — D649 Anemia, unspecified: Secondary | ICD-10-CM

## 2021-07-20 DIAGNOSIS — R1012 Left upper quadrant pain: Secondary | ICD-10-CM | POA: Insufficient documentation

## 2021-07-20 DIAGNOSIS — Z515 Encounter for palliative care: Secondary | ICD-10-CM | POA: Diagnosis not present

## 2021-07-20 DIAGNOSIS — R000 Tachycardia, unspecified: Secondary | ICD-10-CM | POA: Diagnosis not present

## 2021-07-20 DIAGNOSIS — R6510 Systemic inflammatory response syndrome (SIRS) of non-infectious origin without acute organ dysfunction: Secondary | ICD-10-CM | POA: Diagnosis not present

## 2021-07-20 DIAGNOSIS — E669 Obesity, unspecified: Secondary | ICD-10-CM | POA: Diagnosis not present

## 2021-07-20 DIAGNOSIS — C187 Malignant neoplasm of sigmoid colon: Secondary | ICD-10-CM | POA: Diagnosis not present

## 2021-07-20 DIAGNOSIS — Z79899 Other long term (current) drug therapy: Secondary | ICD-10-CM | POA: Diagnosis not present

## 2021-07-20 DIAGNOSIS — Z85038 Personal history of other malignant neoplasm of large intestine: Secondary | ICD-10-CM | POA: Diagnosis not present

## 2021-07-20 DIAGNOSIS — R109 Unspecified abdominal pain: Secondary | ICD-10-CM | POA: Diagnosis not present

## 2021-07-20 DIAGNOSIS — I10 Essential (primary) hypertension: Secondary | ICD-10-CM | POA: Diagnosis not present

## 2021-07-20 DIAGNOSIS — F419 Anxiety disorder, unspecified: Secondary | ICD-10-CM | POA: Diagnosis not present

## 2021-07-20 DIAGNOSIS — G893 Neoplasm related pain (acute) (chronic): Secondary | ICD-10-CM | POA: Diagnosis not present

## 2021-07-20 DIAGNOSIS — Z803 Family history of malignant neoplasm of breast: Secondary | ICD-10-CM | POA: Diagnosis not present

## 2021-07-20 DIAGNOSIS — R5381 Other malaise: Secondary | ICD-10-CM | POA: Diagnosis not present

## 2021-07-20 DIAGNOSIS — Z800 Family history of malignant neoplasm of digestive organs: Secondary | ICD-10-CM | POA: Diagnosis not present

## 2021-07-20 DIAGNOSIS — E876 Hypokalemia: Secondary | ICD-10-CM | POA: Diagnosis not present

## 2021-07-20 DIAGNOSIS — R1013 Epigastric pain: Secondary | ICD-10-CM | POA: Insufficient documentation

## 2021-07-20 DIAGNOSIS — Z452 Encounter for adjustment and management of vascular access device: Secondary | ICD-10-CM | POA: Diagnosis not present

## 2021-07-20 DIAGNOSIS — Z5111 Encounter for antineoplastic chemotherapy: Secondary | ICD-10-CM | POA: Diagnosis not present

## 2021-07-20 LAB — CBC WITH DIFFERENTIAL (CANCER CENTER ONLY)
Abs Immature Granulocytes: 0.03 10*3/uL (ref 0.00–0.07)
Basophils Absolute: 0 10*3/uL (ref 0.0–0.1)
Basophils Relative: 0 %
Eosinophils Absolute: 0.1 10*3/uL (ref 0.0–0.5)
Eosinophils Relative: 1 %
HCT: 32.6 % — ABNORMAL LOW (ref 36.0–46.0)
Hemoglobin: 10.8 g/dL — ABNORMAL LOW (ref 12.0–15.0)
Immature Granulocytes: 0 %
Lymphocytes Relative: 16 %
Lymphs Abs: 1.5 10*3/uL (ref 0.7–4.0)
MCH: 27.3 pg (ref 26.0–34.0)
MCHC: 33.1 g/dL (ref 30.0–36.0)
MCV: 82.3 fL (ref 80.0–100.0)
Monocytes Absolute: 0.4 10*3/uL (ref 0.1–1.0)
Monocytes Relative: 4 %
Neutro Abs: 7.4 10*3/uL (ref 1.7–7.7)
Neutrophils Relative %: 79 %
Platelet Count: 567 10*3/uL — ABNORMAL HIGH (ref 150–400)
RBC: 3.96 MIL/uL (ref 3.87–5.11)
RDW: 15.2 % (ref 11.5–15.5)
WBC Count: 9.4 10*3/uL (ref 4.0–10.5)
nRBC: 0 % (ref 0.0–0.2)

## 2021-07-20 LAB — FERRITIN: Ferritin: 140 ng/mL (ref 11–307)

## 2021-07-20 LAB — CMP (CANCER CENTER ONLY)
ALT: <5 U/L (ref 0–44)
AST: 10 U/L — ABNORMAL LOW (ref 15–41)
Albumin: 3.6 g/dL (ref 3.5–5.0)
Alkaline Phosphatase: 55 U/L (ref 38–126)
Anion gap: 9 (ref 5–15)
BUN: 11 mg/dL (ref 6–20)
CO2: 34 mmol/L — ABNORMAL HIGH (ref 22–32)
Calcium: 9.2 mg/dL (ref 8.9–10.3)
Chloride: 100 mmol/L (ref 98–111)
Creatinine: 0.47 mg/dL (ref 0.44–1.00)
GFR, Estimated: >60 mL/min (ref >60)
Glucose, Bld: 104 mg/dL — ABNORMAL HIGH (ref 70–99)
Potassium: 2.9 mmol/L — ABNORMAL LOW (ref 3.5–5.1)
Sodium: 143 mmol/L (ref 135–145)
Total Bilirubin: 0.3 mg/dL (ref 0.3–1.2)
Total Protein: 7.2 g/dL (ref 6.5–8.1)

## 2021-07-20 LAB — IRON AND IRON BINDING CAPACITY (CC-WL,HP ONLY)
Iron: 46 ug/dL (ref 28–170)
Saturation Ratios: 19 % (ref 10.4–31.8)
TIBC: 244 ug/dL — ABNORMAL LOW (ref 250–450)
UIBC: 198 ug/dL (ref 148–442)

## 2021-07-20 LAB — CEA (IN HOUSE-CHCC): CEA (CHCC-In House): 96.38 ng/mL — ABNORMAL HIGH (ref 0.00–5.00)

## 2021-07-20 MED ORDER — LORAZEPAM 2 MG/ML IJ SOLN
0.5000 mg | Freq: Once | INTRAMUSCULAR | Status: AC
Start: 2021-07-20 — End: 2021-07-20
  Administered 2021-07-20: 0.5 mg via INTRAVENOUS
  Filled 2021-07-20: qty 1

## 2021-07-20 MED ORDER — DEXTROSE 5 % IV SOLN: Freq: Once | INTRAVENOUS | Status: AC

## 2021-07-20 MED ORDER — OXYCODONE HCL 5 MG PO TABS: 5.0000 mg | ORAL_TABLET | Freq: Four times a day (QID) | ORAL | 0 refills | Status: DC | PRN

## 2021-07-20 MED ORDER — OXALIPLATIN CHEMO INJECTION 100 MG/20ML
85.0000 mg/m2 | Freq: Once | INTRAVENOUS | Status: AC
  Administered 2021-07-20: 165 mg via INTRAVENOUS
  Filled 2021-07-20: qty 33

## 2021-07-20 MED ORDER — MORPHINE SULFATE ER 15 MG PO TBCR: 30.0000 mg | EXTENDED_RELEASE_TABLET | Freq: Two times a day (BID) | ORAL | 0 refills | Status: AC

## 2021-07-20 MED ORDER — HYDROMORPHONE HCL 1 MG/ML IJ SOLN
1.0000 mg | Freq: Once | INTRAMUSCULAR | Status: AC
  Administered 2021-07-20: 1 mg via INTRAVENOUS
  Filled 2021-07-20: qty 1

## 2021-07-20 MED ORDER — MORPHINE SULFATE ER 30 MG PO TBCR: 30.0000 mg | EXTENDED_RELEASE_TABLET | Freq: Two times a day (BID) | ORAL | 0 refills | Status: DC

## 2021-07-20 MED ORDER — SODIUM CHLORIDE 0.9 % IV SOLN
2400.0000 mg/m2 | INTRAVENOUS | Status: DC
  Administered 2021-07-20: 4750 mg via INTRAVENOUS
  Filled 2021-07-20: qty 95

## 2021-07-20 MED ORDER — DEXAMETHASONE 4 MG PO TABS: 4.0000 mg | ORAL_TABLET | Freq: Every day | ORAL | 0 refills | Status: DC

## 2021-07-20 MED ORDER — LEUCOVORIN CALCIUM INJECTION 350 MG
400.0000 mg/m2 | Freq: Once | INTRAVENOUS | Status: AC
  Administered 2021-07-20: 788 mg via INTRAVENOUS
  Filled 2021-07-20: qty 17.5

## 2021-07-20 MED ORDER — FLUOROURACIL CHEMO INJECTION 2.5 GM/50ML
400.0000 mg/m2 | Freq: Once | INTRAVENOUS | Status: AC
  Administered 2021-07-20: 800 mg via INTRAVENOUS
  Filled 2021-07-20: qty 16

## 2021-07-20 MED ORDER — SODIUM CHLORIDE 0.9 % IV SOLN
10.0000 mg | Freq: Once | INTRAVENOUS | Status: AC
  Administered 2021-07-20: 10 mg via INTRAVENOUS
  Filled 2021-07-20: qty 10

## 2021-07-20 MED ORDER — PALONOSETRON HCL INJECTION 0.25 MG/5ML
0.2500 mg | Freq: Once | INTRAVENOUS | Status: AC
  Administered 2021-07-20: 0.25 mg via INTRAVENOUS
  Filled 2021-07-20: qty 5

## 2021-07-20 NOTE — Progress Notes (Signed)
Mikayla Rios   [phone]) [phone] Fax:([phone]) [phone]   Clinic Follow up Note   Patient Care Team: [name], MD as PCP - General (Internal Medicine) [name], MD as PCP - Cardiology (Cardiology) [name], [name] as Physician Assistant (Emergency Medicine) [name], MD as Consulting Physician (Oncology) [name], RN as Oncology Nurse Navigator (Oncology) [name], MD as Consulting Physician (General Surgery) [name], [name], MD as Consulting Physician (Neurology) [name], [name], MD as Consulting Physician (Obstetrics and Gynecology) [name], MD as Consulting Physician (Gastroenterology) [name], [name], NP as Nurse Practitioner (Nurse Practitioner)  Date of Service:  07/20/2021  CHIEF COMPLAINT: f/u of sigmoid colon cancer  CURRENT THERAPY:  FOLFOX, q2weeks, started 06/22/21  ASSESSMENT & PLAN:  Mikayla Rios is a 36 y.o. female with   1. 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 She was found to have diverticulitis with perforation. She also developed an abscess requiring 2 drains. She continued to have abdominal pain and drainage, undergoing drain exchange three times. -she developed worsening weakness and was admitted on 04/10/21. She was taken for emergent small bowel and left colon resection on 04/15/21 under Dr. Johney Maine. 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 Margins negative for invasive carcinoma, but low-grade dysplasia involves proximal margin. All 5 lymph nodes negative (0/5). MSI low. -repeat CT AP on 06/16/21 showed progressive metastatic disease involving soft tissue masses in left psoas muscle and left lateral abdominal wall and peritoneal masses, and new mild retroperitoneal lymphadenopathy.  -baseline CEA on  06/22/21 was elevated at 87.41. -she started FOLFOX on 06/22/21. She has tolerated well so far with nausea and mild cold sensitivity. -biopsy of the LUQ peritoneal mass on 07/10/21 confirmed metastatic adenocarcinoma with necrosis, consistent with colon primary. -Labs reviewed, adequate for treatment, will proceed to third cycle chemo FOLFOX today. CEA from today is pending; hopefully this will have gone down, as she has noticed shrinking of the tumor. -We will repeat MSI and obtain Foundation One on her biopsy sample, to see if she is a candidate for EGFR inhibitor and immunotherapy.  -f/u in 2 weeks before cycle 3 chemo   2. Symptom Management: Abdominal pain, Weight loss -she continues to have severe pain at the incision site. She saw Dr. Johney Maine on 06/11/21, who said the pain could last up to a year due to the complexity of her surgery. -her pain is now being managed by palliative care NP Nikki   3. Anxiety and depression -Due to the prolonged illness and multiple hospital stay since May 2022, and cancer diagnosis, she has been feeling depressed lately. -cymbalta prescribed 05/25/21   4. 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 However her MSI was low, instead of high, which is unusual for Lynch syndrome. -We will refer her to genetics due to her young age and family history of colon cancer.  -genetic counseling and lab scheduled for 06/29/21   5. Social Support -she is connected with Education officer, [employer] -she is working to apply for disability. Her job ended just prior to the start of her symptoms. -she has three children-- ages 24, 67, and 13.     PLAN: -proceed to cycle 3 chemotherapy today -NP Niki scheduled to see her in infusion for pain management  -f/u in  2 weeks before cycle 4 chemo  -will request FO on her recent biopsy    No problem-specific Assessment & Plan notes found  for this encounter.   SUMMARY OF ONCOLOGIC HISTORY: Oncology History Overview Note   Cancer Staging  Malignant neoplasm of sigmoid colon [facility]) Staging form: Colon and Rectum, AJCC 8th Edition - Pathologic stage from 04/15/2021: Stage IIC (pT4b, pN0, cM0) - Signed by [name], MD on 06/12/2021    Malignant neoplasm of sigmoid colon [facility])   Initial Diagnosis   Malignant neoplasm of sigmoid colon ([place])   11/04/2020 Imaging   EXAM: CT ABDOMEN AND PELVIS WITHOUT CONTRAST  IMPRESSION: 1. Perforating descending colonic diverticulitis with multiple abdominopelvic gas and fluid collections, measuring up to 5.7 cm and further described above. 2. Cholelithiasis without findings of acute cholecystitis. 3. 3.6 cm benign left adrenal adenoma. 4. Leiomyomatous uterus. 5. 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   11/07/2020 Imaging   EXAM: CT ABDOMEN AND PELVIS WITHOUT CONTRAST  IMPRESSION: Continued wall thickening is seen involving descending colon suggesting infectious or inflammatory colitis or perforated diverticulitis. There is an adjacent fluid collection measuring 7.0 x 5.0 cm consistent with abscess which is significantly enlarged compared to prior exam. Adjacent to the abscess, there is a severely thickened small bowel loop most consistent with secondary inflammation.   5.2 x 3.5 cm fluid collection is noted within the left psoas muscle consistent with abscess which is significantly enlarged compared to prior exam. 4.4 x 3.8 cm fluid collection consistent with abscess is noted in the left retroperitoneal region which is also enlarged compared to prior exam.   Fibroid uterus.   Cholelithiasis.   11/27/2020 Imaging   EXAM: CT ABDOMEN AND PELVIS WITHOUT CONTRAST  IMPRESSION: 1. Significantly interval decreased size of the previously visualized left retroperitoneal and left anterior  mid abdominal fluid collections. There is persistent fat stranding and mural thickening of the mid descending colon at this level. 2. Unchanged leiomyomatous uterus. 3. Unchanged cholelithiasis.   12/11/2020 Imaging   EXAM: CT ABDOMEN AND PELVIS WITHOUT CONTRAST  IMPRESSION: 1. Stable inflammatory changes centered around the distal descending colon in the left lower abdomen. Pericolonic inflammatory changes have minimally changed since 11/27/2020. Small pocket of gas medial to the colon is probably associated with a fistula or small residual abscess collection. 2. Stable position of the two percutaneous drains. The more anterior drain may be extending through a portion of the small bowel. 3. Cholelithiasis. 4. Fibroid uterus. Cannot exclude an ovarian/adnexal cystic structure near the uterine fundus. 5. Left adrenal adenoma.   01/07/2021 Imaging   EXAM: CT ABDOMEN AND PELVIS WITH CONTRAST  IMPRESSION: 1. No new abscesses identified. Similar degree of soft tissue thickening seen in the left pericolonic region. The degree of persistent soft tissues thickening in the pericolonic space further raises suspicions for malignancy. Further evaluation with colonoscopy should be performed. 2. Left retroperitoneal abscess drain unchanged in position. Anterior left abdominal drain again seen terminating within small bowel loop. 3. 2.6 cm mildly sclerotic lesion noted in the L2 vertebral body. Further evaluation with contrast enhanced lumbar spine MRI should be performed.   02/04/2021 Imaging   EXAM: CT ABDOMEN AND PELVIS WITH CONTRAST  IMPRESSION: No new abdominopelvic collections or abscess development in the 1 month interval.   Left anterior drain remains within a loop of small bowel, unchanged.   Left lateral abscess drain remains in the retroperitoneal  space adjacent to the iliopsoas muscle with a small amount of residual fluid and air but no measurable collection.   Stable soft  tissue prominence and pericolonic strandy edema/inflammation about the left descending colon compatible with residual diverticulitis/colitis. Difficult to exclude underlying transmural lesion.   04/01/2021 Imaging   EXAM: CT ABDOMEN AND PELVIS WITH CONTRAST  IMPRESSION: There appears to be significant enhancement and wall thickening involving the descending colon with some degree of traction and involvement of adjacent small bowel loops. This is consistent with the history of diverticulitis and perforation. Stable position of percutaneous drainage catheter is seen adjacent to left psoas muscle with no significant residual fluid remaining. The other percutaneous drainage catheter that was previously noted to be within small bowel loop on prior exam in the left lower quadrant, has significantly retracted and appears to be outside of the peritoneal space at this time.   Since the prior exam, there does appear to be some degree of rotation involving mesenteric vessels and structures in the right lower quadrant, suggesting partial volvulus or malrotation. There is the interval development of several lymph nodes in this area, most likely inflammatory or reactive in etiology. Mild amount of free fluid is also noted in the pelvis. However, no significant bowel wall thickening or dilatation is seen in this area. These results will be called to the ordering clinician or representative by the Radiologist Assistant, and communication documented in the PACS or zVision Dashboard.   Stable uterine fibroid.   Hepatic steatosis.   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   Cholelithiasis.   04/10/2021 Imaging   EXAM: CT ANGIOGRAPHY CHEST CT ABDOMEN AND PELVIS WITH CONTRAST  IMPRESSION: 1. Again seen are findings compatible with descending colon diverticulitis with perforation. Free air and inflammation have increased in the interval. 2. New lobulated enhancing fluid collection posterior to  this segment of inflamed colon measuring 8.5 x 3.5 x 10.0 cm. This collection now invades the adjacent iliopsoas muscle as well as extends through the left lateral abdominal wall. The tip of the drainage catheter is in this collection. Findings are compatible with abscess. 3. Anterior left percutaneous drainage catheter tip has been pulled back and is now within the subcutaneous tissues. 4. Trace free fluid. 5. No pulmonary embolism.  No acute cardiopulmonary process. 6. Cholelithiasis. 7. Fatty infiltration of the liver.   04/15/2021 Definitive Surgery   FINAL MICROSCOPIC DIAGNOSIS:   A. SMALL BOWEL, RESECTION:  - Adenocarcinoma.  - No carcinoma identified in 1 lymph node.   B. PERFORATED LEFT COLON, RESECTION:  - Moderately differentiated colonic adenocarcinoma.  - Tumor extends into pericolonic adipose tissue, and is strongly  suggestive of invasion into small bowel.  See oncology table/comments.  - No carcinoma identified in 4 lymph nodes (0/4).  - Tubular adenoma with high-grade dysplasia, 1.  - Tubular adenomas with low grade dysplasia, 3.   Comments: The size of the tumor is difficult to estimate secondary to the disrupted nature of the specimen, as well as the infiltrative nature of the tumor.  Tumor can be identified as definitely invading into the pericolonic adipose tissue (block B4), but given the extreme disruption of the tissue, and the involvement of the small bowel by what appears to be a colonic adenocarcinoma, I favor perforation of the large bowel with direct invasion into the small bowel.  Accordingly, I believe this is best regarded as a pT4b lesion.   ADDENDUM:  Mismatch Repair Protein (IHC)  SUMMARY INTERPRETATION: ABNORMAL  There is loss  of the major MMR protein [name]6: This indicates a high probability that a hereditary germline mutation is present and referral to genetic counseling is warranted. It is recommended that the loss of protein expression be  correlated with molecular based microsatellite instability testing.   IHC EXPRESSION RESULTS  TEST           RESULT  MLH1:          Preserved nuclear expression  [name]2:          Preserved nuclear expression  [name]6:          LOSS OF NUCLEAR EXPRESSION  PMS2:          Preserved nuclear expression    04/15/2021 Cancer Staging   Staging form: Colon and Rectum, AJCC 8th Edition - Pathologic stage from 04/15/2021: Stage IIC (pT4b, pN0, cM0) - Signed by [name], MD on 06/12/2021 Stage prefix: Initial diagnosis Total positive nodes: 0 Histologic grading system: 4 grade system Histologic grade (G): G2 Residual tumor (R): R0 - None    04/29/2021 Imaging   EXAM: CT ABDOMEN AND PELVIS WITH CONTRAST  IMPRESSION: Continued left retroperitoneal abscesses inferior to the left kidney, involving the left psoas muscle and left abdominal wall musculature. Overall size is decreased since prior study. Interval removal of left lower quadrant abscess drainage catheter.   New fluid collection in the cul-de-sac and wrapping around the uterus concerning for abscess.   Cholelithiasis.   Small left pleural effusion.  Bibasilar atelectasis.   Stable left adrenal adenoma.  ADDENDUM: After discussing the case with Dr. Dwaine Gale in interventional radiology, it was noted that the fluid in the pelvis originally thought to be in the cul-de-sac is likely within the vagina, best seen on sagittal imaging. Recommend speculum exam.   Also, in the left lateral wall in the area of prior abscess in the lateral wall abdominal musculature, some of the abdomen soft tissue appears to be enhancing. While this could be infectious, cannot exclude tumor seeding in the left lateral abdominal wall, measuring 6.9 x 3.8 cm on image 64 of series 2.   06/16/2021 Imaging   EXAM: CT ABDOMEN AND PELVIS WITH CONTRAST  IMPRESSION: Increased size of bulky soft tissue masses involving the left psoas muscle and left lateral abdominal wall  soft tissues, consistent with progressive metastatic disease.   Significant progression of multiple peritoneal masses throughout the abdomen and pelvis, consistent with peritoneal carcinomatosis.   New mild retroperitoneal lymphadenopathy, consistent with metastatic disease.   Stable uterine fibroid and left adrenal adenoma.   Cholelithiasis, without evidence of cholecystitis.   06/22/2021 -  Chemotherapy   Patient is on Treatment Plan : COLORECTAL FOLFOX q14d x 6 months     06/22/2021 Tumor Marker   Patient's tumor was tested for the following markers: CEA. Results of the tumor marker test revealed 87.41.   07/10/2021 Pathology Results   FINAL MICROSCOPIC DIAGNOSIS:   A. PERITONEAL MASS, LEFT UPPER ABDOMINAL QUADRANT, BIOPSY:  -  Metastatic adenocarcinoma with necrosis, histologically consistent with colon primary.       INTERVAL HISTORY:  Mikayla Rios is here for a follow up of colon cancer. She was last seen by me on 07/06/21. She presents to the clinic accompanied by her husband. She reports she had nausea with vomiting for a few days after chemo. She reports the Zofran was more effective than the compazine. In addition to nausea, she reports cold sensitivity from the chemo; she notes this does resolve. She reports her pain  is better managed now. She is taking morphine BID, but she feels its efficacy has worn off. She also uses oxycodone, now taking 2 tablets q4hr. She wonders if not having the gabapentin for the last week made her pain more difficult to manage. I will let NP Nikki adjust her pain medication. She reports the size of her tumor seems to be shrinking; she notes it used to be noticeable through her shirt but is not as much so now.   All other systems were reviewed with the patient and are negative.  MEDICAL HISTORY:  Past Medical History:  Diagnosis Date   Allergy    seasonal   Anemia    Blood infection 1985   Blood transfusion without reported diagnosis     Diverticulitis    Family history of breast cancer    Family history of colon cancer    Family history of stomach cancer    Hypertension    Obesity    Sleep apnea     SURGICAL HISTORY: Past Surgical History:  Procedure Laterality Date   COLECTOMY WITH COLOSTOMY CREATION/HARTMANN PROCEDURE N/A 04/15/2021   Procedure: COLOSTOMY CREATION/HARTMANN PROCEDURE;  Surgeon: [name], MD;  Location: [facility];  Service: General;  Laterality: N/A;   IR CATHETER TUBE CHANGE  12/12/2020   IR CATHETER TUBE CHANGE  01/27/2021   IR CATHETER TUBE CHANGE  02/19/2021   IR CATHETER TUBE CHANGE  04/13/2021   IR RADIOLOGIST EVAL & MGMT  11/27/2020   IR RADIOLOGIST EVAL & MGMT  12/11/2020   IR RADIOLOGIST EVAL & MGMT  01/07/2021   IR RADIOLOGIST EVAL & MGMT  02/04/2021   IR SINUS/FIST TUBE CHK-NON GI  12/12/2020   IR SINUS/FIST TUBE CHK-NON GI  02/19/2021   LAPAROSCOPIC PARTIAL COLECTOMY N/A 04/15/2021   Procedure: LAPAROSCOPIC ASSISTED HARTMANN RESECTION;  Surgeon: [name], MD;  Location: [facility];  Service: General;  Laterality: N/A;    I have reviewed the social history and family history with the patient and they are unchanged from previous note.  ALLERGIES:  is allergic to chlorhexidine gluconate, shellfish allergy, and penicillins.  MEDICATIONS:  Current Outpatient Medications  Medication Sig Dispense Refill   dexamethasone (DECADRON) 4 MG tablet Take 1 tablet (4 mg total) by mouth daily. Take for 3-5 day after chemo 20 tablet 0   amLODipine (NORVASC) 10 MG tablet Take 10 mg by mouth daily.     diclofenac Sodium (VOLTAREN) 1 % GEL Apply 4 g topically 4 (four) times daily as needed (for mild pain). 4 g 0   DULoxetine (CYMBALTA) 30 MG capsule Take 1 capsule (30 mg total) by mouth daily. 60 capsule 1   gabapentin (NEURONTIN) 300 MG capsule Take 1 capsule (300 mg) by mouth twice daily and Take 2 capsules (650m) by mouth at bedtime. 120 capsule 0   heparin lock flush 100 UNIT/ML SOLN injection  Use 1 syringe (into each lumen) twice daily 600 mL 0   KLOR-CON M20 20 MEQ tablet TAKE 1 TABLET (20 MEQ TOTAL) BY MOUTH DAILY. TAKE 1 TAB TWICE DAILY FOR 3 DAYS THEN ONCE DAILY 30 tablet 1   metoprolol tartrate (LOPRESSOR) 50 MG tablet Take 1 tablet (50 mg total) by mouth 2 (two) times daily. 180 tablet 2   morphine (MS CONTIN) 15 MG 12 hr tablet Take 1 tablet (15 mg total) by mouth every 12 (twelve) hours. 60 tablet 0   ondansetron (ZOFRAN) 8 MG tablet Take 1 tablet (8 mg total) by mouth 2 (two) times  daily as needed for refractory nausea / vomiting. Start on day 3 after chemotherapy. 30 tablet 1   oxyCODONE-acetaminophen (PERCOCET) 7.5-325 MG tablet Take 1-2 tablets by mouth every 4 (four) hours as needed for severe pain or moderate pain. 120 tablet 0   prochlorperazine (COMPAZINE) 10 MG tablet Take 1 tablet (10 mg total) by mouth every 6 (six) hours as needed (Nausea or vomiting). 30 tablet 1   Sodium Chloride Flush (NORMAL SALINE FLUSH) 0.9 % SOLN Inject 10 mLs (1 syringe) into lumen twice daily as directed 1200 mL 2   No current facility-administered medications for this visit.    PHYSICAL EXAMINATION: ECOG PERFORMANCE STATUS: 2 - Symptomatic, <50% confined to bed  Vitals:   07/20/21 1013  BP: (!) 138/99  Pulse: (!) 107  Resp: 17  Temp: 98.8 F (37.1 C)  SpO2: 100%   Wt Readings from Last 3 Encounters:  07/20/21 190 lb 4.8 oz (86.3 kg)  07/14/21 197 lb (89.4 kg)  07/08/21 197 lb 9 oz (89.6 kg)     GENERAL:alert, no distress and comfortable SKIN: skin color, texture, turgor are normal, no rashes or significant lesions EYES: normal, Conjunctiva are pink and non-injected, sclera clear  NECK: supple, thyroid normal size, non-tender, without nodularity LYMPH:  no palpable lymphadenopathy in the cervical, axillary  LUNGS: clear to auscultation and percussion with normal breathing effort HEART: regular rate & rhythm and no murmurs and no lower extremity edema ABDOMEN:abdomen soft,  non-tender and normal bowel sounds Musculoskeletal:no cyanosis of digits and no clubbing  NEURO: alert & oriented x 3 with fluent speech, no focal motor/sensory deficits  LABORATORY DATA:  I have reviewed the data as listed CBC Latest Ref Rng & Units 07/20/2021 07/10/2021 07/06/2021  WBC 4.0 - 10.5 K/uL 9.4 6.1 8.7  Hemoglobin 12.0 - 15.0 g/dL 10.8(L) 10.1(L) 10.6(L)  Hematocrit 36.0 - 46.0 % 32.6(L) 32.0(L) 32.5(L)  Platelets 150 - 400 K/uL 567(H) 488(H) 494(H)     CMP Latest Ref Rng & Units 07/20/2021 07/06/2021 06/29/2021  Glucose 70 - 99 mg/dL 104(H) 103(H) 87  BUN 6 - 20 mg/dL 11 13 9   Creatinine 0.44 - 1.00 mg/dL 0.47 0.67 0.52  Sodium 135 - 145 mmol/L 143 140 138  Potassium 3.5 - 5.1 mmol/L 2.9(L) 3.2(L) 3.2(L)  Chloride 98 - 111 mmol/L 100 99 98  CO2 22 - 32 mmol/L 34(H) 33(H) 28  Calcium 8.9 - 10.3 mg/dL 9.2 9.5 9.7  Total Protein 6.5 - 8.1 g/dL 7.2 7.5 7.8  Total Bilirubin 0.3 - 1.2 mg/dL 0.3 0.3 0.3  Alkaline Phos 38 - 126 U/L 55 43 43  AST 15 - 41 U/L 10(L) 12(L) 9(L)  ALT 0 - 44 U/L <5 6 5       RADIOGRAPHIC STUDIES: I have personally reviewed the radiological images as listed and agreed with the findings in the report. No results found.    Orders Placed This Encounter  Procedures   Iron and Iron Binding Capacity ([facility] only)   All questions were answered. The patient knows to call the clinic with any problems, questions or concerns. No barriers to learning was detected. The total time spent in the appointment was 30 minutes.     [name], MD 07/20/2021   I, Wilburn Mylar, am acting as scribe for [name], MD.   I have reviewed the above documentation for accuracy and completeness, and I agree with the above.

## 2021-07-20 NOTE — ED Triage Notes (Addendum)
Biba from home- on chemo (third round today) for colon cancer. Felt abd pain and general malaise after tx at 4pm. Pt states that she thinks she was exposed to CHG during her tx, which she has an allergy to. Took benadryl at home pta. Took 10mg  oxycodone at 5pm

## 2021-07-20 NOTE — Progress Notes (Signed)
Ok to treat today per Dr. Burr Medico with potassium of 2.9 and without a pregnancy test.

## 2021-07-20 NOTE — Patient Instructions (Signed)
[facility]  Discharge Instructions: Thank you for choosing [facility] to provide your oncology and hematology care.   If you have a lab appointment with the [facility], please go directly to the [facility] and check in at the registration area.   Wear comfortable clothing and clothing appropriate for easy access to any Portacath or PICC line.   We strive to give you quality time with your provider. You may need to reschedule your appointment if you arrive late (15 or more minutes).  Arriving late affects you and other patients whose appointments are after yours.  Also, if you miss three or more appointments without notifying the office, you may be dismissed from the clinic at the providers discretion.      For prescription refill requests, have your pharmacy contact our office and allow 72 hours for refills to be completed.    Today you received the following chemotherapy and/or immunotherapy agents: Oxaliplatin, Leucovorin, and Fluorouracil.   To help prevent nausea and vomiting after your treatment, we encourage you to take your nausea medication as directed.  BELOW ARE SYMPTOMS THAT SHOULD BE REPORTED IMMEDIATELY: *FEVER GREATER THAN 100.4 F (38 C) OR HIGHER *CHILLS OR SWEATING *NAUSEA AND VOMITING THAT IS NOT CONTROLLED WITH YOUR NAUSEA MEDICATION *UNUSUAL SHORTNESS OF BREATH *UNUSUAL BRUISING OR BLEEDING *URINARY PROBLEMS (pain or burning when urinating, or frequent urination) *BOWEL PROBLEMS (unusual diarrhea, constipation, pain near the anus) TENDERNESS IN MOUTH AND THROAT WITH OR WITHOUT PRESENCE OF ULCERS (sore throat, sores in mouth, or a toothache) UNUSUAL RASH, SWELLING OR PAIN  UNUSUAL VAGINAL DISCHARGE OR ITCHING   Items with * indicate a potential emergency and should be followed up as soon as possible or go to the Emergency Department if any problems should occur.  Please show the CHEMOTHERAPY ALERT CARD or  IMMUNOTHERAPY ALERT CARD at check-in to the Emergency Department and triage nurse.  Should you have questions after your visit or need to cancel or reschedule your appointment, please contact [facility]  Dept: [phone]  and follow the prompts.  Office hours are 8:00 a.m. to 4:30 p.m. Monday - Friday. Please note that voicemails left after 4:00 p.m. may not be returned until the following business day.  We are closed weekends and major holidays. You have access to a nurse at all times for urgent questions. Please call the main number to the clinic Dept: [phone] and follow the prompts.   For any non-urgent questions, you may also contact your provider using MyChart. We now offer e-Visits for anyone 52 and older to request care online for non-urgent symptoms. For details visit [email or web address].   Also download the MyChart app! Go to the app store, search "MyChart", open the app, select [facility], and log in with your MyChart username and password.  Due to Covid, a mask is required upon entering the hospital/clinic. If you do not have a mask, one will be given to you upon arrival. For doctor visits, patients may have 1 support person aged 75 or older with them. For treatment visits, patients cannot have anyone with them due to current Covid guidelines and our immunocompromised population.   The chemotherapy medication bag should finish at 46 hours, 96 hours, or 7 days. For example, if your pump is scheduled for 46 hours and it was put on at 4:00 p.m., it should finish at 2:00 p.m. the day it is scheduled to come off regardless of your  appointment time.     Estimated time to finish at: 4:00 PM   If the display on your pump reads "Low Volume" and it is beeping, take the batteries out of the pump and come to the cancer center for it to be taken off.   If the pump alarms go off prior to the pump reading "Low Volume" then call [phone] and someone can  assist you.  If the plunger comes out and the chemotherapy medication is leaking out, please use your home chemo spill kit to clean up the spill. Do NOT use paper towels or other household products.  If you have problems or questions regarding your pump, please call either [phone] (24 hours a day) or the cancer center Monday-Friday 8:00 a.m.- 4:30 p.m. at the clinic number and we will assist you. If you are unable to get assistance, then go to the nearest Emergency Department and ask the staff to contact the IV team for assistance.    Oxaliplatin Injection What is this medication? OXALIPLATIN (ox AL i PLA tin) is a chemotherapy drug. It targets fast dividing cells, like cancer cells, and causes these cells to die. This medicine is used to treat cancers of the colon and rectum, and many other cancers. This medicine may be used for other purposes; ask your health care provider or pharmacist if you have questions. COMMON BRAND NAME(S): Eloxatin What should I tell my care team before I take this medication? They need to know if you have any of these conditions: heart disease history of irregular heartbeat liver disease low blood counts, like white cells, platelets, or red blood cells lung or breathing disease, like asthma take medicines that treat or prevent blood clots tingling of the fingers or toes, or other nerve disorder an unusual or allergic reaction to oxaliplatin, other chemotherapy, other medicines, foods, dyes, or preservatives pregnant or trying to get pregnant breast-feeding How should I use this medication? This drug is given as an infusion into a vein. It is administered in a hospital or clinic by a specially trained health care professional. Talk to your pediatrician regarding the use of this medicine in children. Special care may be needed. Overdosage: If you think you have taken too much of this medicine contact a poison control center or emergency room at once. NOTE:  This medicine is only for you. Do not share this medicine with others. What if I miss a dose? It is important not to miss a dose. Call your doctor or health care professional if you are unable to keep an appointment. What may interact with this medication? Do not take this medicine with any of the following medications: cisapride dronedarone pimozide thioridazine This medicine may also interact with the following medications: aspirin and aspirin-like medicines certain medicines that treat or prevent blood clots like warfarin, apixaban, dabigatran, and rivaroxaban cisplatin cyclosporine diuretics medicines for infection like acyclovir, adefovir, amphotericin B, bacitracin, cidofovir, foscarnet, ganciclovir, gentamicin, pentamidine, vancomycin NSAIDs, medicines for pain and inflammation, like ibuprofen or naproxen other medicines that prolong the QT interval (an abnormal heart rhythm) pamidronate zoledronic acid This list may not describe all possible interactions. Give your health care provider a list of all the medicines, herbs, non-prescription drugs, or dietary supplements you use. Also tell them if you smoke, drink alcohol, or use illegal drugs. Some items may interact with your medicine. What should I watch for while using this medication? Your condition will be monitored carefully while you are receiving this medicine. You may need  blood work done while you are taking this medicine. This medicine may make you feel generally unwell. This is not uncommon as chemotherapy can affect healthy cells as well as cancer cells. Report any side effects. Continue your course of treatment even though you feel ill unless your healthcare professional tells you to stop. This medicine can make you more sensitive to cold. Do not drink cold drinks or use ice. Cover exposed skin before coming in contact with cold temperatures or cold objects. When out in cold weather wear warm clothing and cover your mouth  and nose to warm the air that goes into your lungs. Tell your doctor if you get sensitive to the cold. Do not become pregnant while taking this medicine or for 9 months after stopping it. Women should inform their health care professional if they wish to become pregnant or think they might be pregnant. Men should not father a child while taking this medicine and for 6 months after stopping it. There is potential for serious side effects to an unborn child. Talk to your health care professional for more information. Do not breast-feed a child while taking this medicine or for 3 months after stopping it. This medicine has caused ovarian failure in some women. This medicine may make it more difficult to get pregnant. Talk to your health care professional if you are concerned about your fertility. This medicine has caused decreased sperm counts in some men. This may make it more difficult to father a child. Talk to your health care professional if you are concerned about your fertility. This medicine may increase your risk of getting an infection. Call your health care professional for advice if you get a fever, chills, or sore throat, or other symptoms of a cold or flu. Do not treat yourself. Try to avoid being around people who are sick. Avoid taking medicines that contain aspirin, acetaminophen, ibuprofen, naproxen, or ketoprofen unless instructed by your health care professional. These medicines may hide a fever. Be careful brushing or flossing your teeth or using a toothpick because you may get an infection or bleed more easily. If you have any dental work done, tell your dentist you are receiving this medicine. What side effects may I notice from receiving this medication? Side effects that you should report to your doctor or health care professional as soon as possible: allergic reactions like skin rash, itching or hives, swelling of the face, lips, or tongue breathing problems cough low blood counts  - this medicine may decrease the number of white blood cells, red blood cells, and platelets. You may be at increased risk for infections and bleeding nausea, vomiting pain, redness, or irritation at site where injected pain, tingling, numbness in the hands or feet signs and symptoms of bleeding such as bloody or black, tarry stools; red or dark brown urine; spitting up blood or brown material that looks like coffee grounds; red spots on the skin; unusual bruising or bleeding from the eyes, gums, or nose signs and symptoms of a dangerous change in heartbeat or heart rhythm like chest pain; dizziness; fast, irregular heartbeat; palpitations; feeling faint or lightheaded; falls signs and symptoms of infection like fever; chills; cough; sore throat; pain or trouble passing urine signs and symptoms of liver injury like dark yellow or brown urine; general ill feeling or flu-like symptoms; light-colored stools; loss of appetite; nausea; right upper belly pain; unusually weak or tired; yellowing of the eyes or skin signs and symptoms of low red blood cells  or anemia such as unusually weak or tired; feeling faint or lightheaded; falls signs and symptoms of muscle injury like dark urine; trouble passing urine or change in the amount of urine; unusually weak or tired; muscle pain; back pain Side effects that usually do not require medical attention (report to your doctor or health care professional if they continue or are bothersome): changes in taste diarrhea gas hair loss loss of appetite mouth sores This list may not describe all possible side effects. Call your doctor for medical advice about side effects. You may report side effects to FDA at 1-800-FDA-1088. Where should I keep my medication? This drug is given in a hospital or clinic and will not be stored at home. NOTE: This sheet is a summary. It may not cover all possible information. If you have questions about this medicine, talk to your doctor,  pharmacist, or health care provider.  2022 [employer] Standard (2021-02-10 00:00:00)  Leucovorin injection What is this medication? LEUCOVORIN (loo koe VOR in) is used to prevent or treat the harmful effects of some medicines. This medicine is used to treat anemia caused by a low amount of folic acid in the body. It is also used with 5-fluorouracil (5-FU) to treat colon cancer. This medicine may be used for other purposes; ask your health care provider or pharmacist if you have questions. What should I tell my care team before I take this medication? They need to know if you have any of these conditions: anemia from low levels of vitamin B-12 in the blood an unusual or allergic reaction to leucovorin, folic acid, other medicines, foods, dyes, or preservatives pregnant or trying to get pregnant breast-feeding How should I use this medication? This medicine is for injection into a muscle or into a vein. It is given by a health care professional in a hospital or clinic setting. Talk to your pediatrician regarding the use of this medicine in children. Special care may be needed. Overdosage: If you think you have taken too much of this medicine contact a poison control center or emergency room at once. NOTE: This medicine is only for you. Do not share this medicine with others. What if I miss a dose? This does not apply. What may interact with this medication? capecitabine fluorouracil phenobarbital phenytoin primidone trimethoprim-sulfamethoxazole This list may not describe all possible interactions. Give your health care provider a list of all the medicines, herbs, non-prescription drugs, or dietary supplements you use. Also tell them if you smoke, drink alcohol, or use illegal drugs. Some items may interact with your medicine. What should I watch for while using this medication? Your condition will be monitored carefully while you are receiving this medicine. This medicine may  increase the side effects of 5-fluorouracil, 5-FU. Tell your doctor or health care professional if you have diarrhea or mouth sores that do not get better or that get worse. What side effects may I notice from receiving this medication? Side effects that you should report to your doctor or health care professional as soon as possible: allergic reactions like skin rash, itching or hives, swelling of the face, lips, or tongue breathing problems fever, infection mouth sores unusual bleeding or bruising unusually weak or tired Side effects that usually do not require medical attention (report to your doctor or health care professional if they continue or are bothersome): constipation or diarrhea loss of appetite nausea, vomiting This list may not describe all possible side effects. Call your doctor for medical advice about side effects.  You may report side effects to FDA at 1-800-FDA-1088. Where should I keep my medication? This drug is given in a hospital or clinic and will not be stored at home. NOTE: This sheet is a summary. It may not cover all possible information. If you have questions about this medicine, talk to your doctor, pharmacist, or health care provider.  2022 Elsevier/Gold Standard (2007-11-30 00:00:00)  Fluorouracil, 5-FU injection What is this medication? FLUOROURACIL, 5-FU (flure oh YOOR a sil) is a chemotherapy drug. It slows the growth of cancer cells. This medicine is used to treat many types of cancer like breast cancer, colon or rectal cancer, pancreatic cancer, and stomach cancer. This medicine may be used for other purposes; ask your health care provider or pharmacist if you have questions. COMMON BRAND NAME(S): Adrucil What should I tell my care team before I take this medication? They need to know if you have any of these conditions: blood disorders dihydropyrimidine dehydrogenase (DPD) deficiency infection (especially a virus infection such as chickenpox, cold  sores, or herpes) kidney disease liver disease malnourished, poor nutrition recent or ongoing radiation therapy an unusual or allergic reaction to fluorouracil, other chemotherapy, other medicines, foods, dyes, or preservatives pregnant or trying to get pregnant breast-feeding How should I use this medication? This drug is given as an infusion or injection into a vein. It is administered in a hospital or clinic by a specially trained health care professional. Talk to your pediatrician regarding the use of this medicine in children. Special care may be needed. Overdosage: If you think you have taken too much of this medicine contact a poison control center or emergency room at once. NOTE: This medicine is only for you. Do not share this medicine with others. What if I miss a dose? It is important not to miss your dose. Call your doctor or health care professional if you are unable to keep an appointment. What may interact with this medication? Do not take this medicine with any of the following medications: live virus vaccines This medicine may also interact with the following medications: medicines that treat or prevent blood clots like warfarin, enoxaparin, and dalteparin This list may not describe all possible interactions. Give your health care provider a list of all the medicines, herbs, non-prescription drugs, or dietary supplements you use. Also tell them if you smoke, drink alcohol, or use illegal drugs. Some items may interact with your medicine. What should I watch for while using this medication? Visit your doctor for checks on your progress. This drug may make you feel generally unwell. This is not uncommon, as chemotherapy can affect healthy cells as well as cancer cells. Report any side effects. Continue your course of treatment even though you feel ill unless your doctor tells you to stop. In some cases, you may be given additional medicines to help with side effects. Follow all  directions for their use. Call your doctor or health care professional for advice if you get a fever, chills or sore throat, or other symptoms of a cold or flu. Do not treat yourself. This drug decreases your body's ability to fight infections. Try to avoid being around people who are sick. This medicine may increase your risk to bruise or bleed. Call your doctor or health care professional if you notice any unusual bleeding. Be careful brushing and flossing your teeth or using a toothpick because you may get an infection or bleed more easily. If you have any dental work done, tell your dentist you  are receiving this medicine. Avoid taking products that contain aspirin, acetaminophen, ibuprofen, naproxen, or ketoprofen unless instructed by your doctor. These medicines may hide a fever. Do not become pregnant while taking this medicine. Women should inform their doctor if they wish to become pregnant or think they might be pregnant. There is a potential for serious side effects to an unborn child. Talk to your health care professional or pharmacist for more information. Do not breast-feed an infant while taking this medicine. Men should inform their doctor if they wish to father a child. This medicine may lower sperm counts. Do not treat diarrhea with over the counter products. Contact your doctor if you have diarrhea that lasts more than 2 days or if it is severe and watery. This medicine can make you more sensitive to the sun. Keep out of the sun. If you cannot avoid being in the sun, wear protective clothing and use sunscreen. Do not use sun lamps or tanning beds/booths. What side effects may I notice from receiving this medication? Side effects that you should report to your doctor or health care professional as soon as possible: allergic reactions like skin rash, itching or hives, swelling of the face, lips, or tongue low blood counts - this medicine may decrease the number of white blood cells, red  blood cells and platelets. You may be at increased risk for infections and bleeding. signs of infection - fever or chills, cough, sore throat, pain or difficulty passing urine signs of decreased platelets or bleeding - bruising, pinpoint red spots on the skin, black, tarry stools, blood in the urine signs of decreased red blood cells - unusually weak or tired, fainting spells, lightheadedness breathing problems changes in vision chest pain mouth sores nausea and vomiting pain, swelling, redness at site where injected pain, tingling, numbness in the hands or feet redness, swelling, or sores on hands or feet stomach pain unusual bleeding Side effects that usually do not require medical attention (report to your doctor or health care professional if they continue or are bothersome): changes in finger or toe nails diarrhea dry or itchy skin hair loss headache loss of appetite sensitivity of eyes to the light stomach upset unusually teary eyes This list may not describe all possible side effects. Call your doctor for medical advice about side effects. You may report side effects to FDA at 1-800-FDA-1088. Where should I keep my medication? This drug is given in a hospital or clinic and will not be stored at home. NOTE: This sheet is a summary. It may not cover all possible information. If you have questions about this medicine, talk to your doctor, pharmacist, or health care provider.  2022 [employer]/Gold Standard (2021-02-10 00:00:00)

## 2021-07-20 NOTE — ED Provider Notes (Signed)
[facility] DEPT Provider Note   CSN: [id number] Arrival date & time: 07/20/21  2230     History  Chief Complaint  Patient presents with   Fatigue    General malaise and pain    Mikayla Rios is a 36 y.o. female.  The history is provided by the patient.  Mikayla Rios is a 36 y.o. female who presents to the Emergency Department complaining of reaction.  She presents to the emergency department accompanied by her husband for evaluation of possible allergic reaction during chemo.  She has a history of metastatic colon cancer and is currently undergoing palliative chemotherapy.  She had her third treatment today.  She states that she has an allergy to chlorhexidine wipes and the nurse to use a wipe before administering her chemotherapy.  She typically gets rash and itching when she is exposed to these wipes.  She states that when she left chemo around 430 when the cold wind hit her face she felt like her throat had ice in it.  She later felt like her voice was changed and she had difficulty breathing.  She took Benadryl at home at 530 this evening.  She reports feeling hot and cold and very panicky.  She also feels very uncomfortable.  She has chronic abdominal pain secondary to her colon cancer and states that her pain is also uncontrolled.  She was started on oxycodone today for her pain and feels like her pain is only minimally improved.  Overall her breathing and throat are feeling improved compared to earlier today. She had labs drawn earlier and was found to have a low potassium at 2.9.  She received oral supplementation.     Home Medications Prior to Admission medications   Medication Sig Start Date End Date Taking? Authorizing Provider  amLODipine (NORVASC) 10 MG tablet Take 10 mg by mouth daily. 05/26/21   [provider]  dexamethasone (DECADRON) 4 MG tablet Take 1 tablet (4 mg total) by mouth daily. Take for 3-5 day after chemo 07/20/21    [name], MD  diclofenac Sodium (VOLTAREN) 1 % GEL Apply 4 g topically 4 (four) times daily as needed (for mild pain). 05/14/21   Angiulli, Lavon Paganini, PA-C  DULoxetine (CYMBALTA) 30 MG capsule Take 1 capsule (30 mg total) by mouth daily. 06/29/21   [name], [name], NP  gabapentin (NEURONTIN) 300 MG capsule Take 1 capsule (300 mg) by mouth twice daily and Take 2 capsules (600mg ) by mouth at bedtime. 07/10/21   [name], [name], NP  heparin lock flush 100 UNIT/ML SOLN injection Use 1 syringe (into each lumen) twice daily 06/30/21   [name], MD  KLOR-CON M20 20 MEQ tablet TAKE 1 TABLET (20 MEQ TOTAL) BY MOUTH DAILY. TAKE 1 TAB TWICE DAILY FOR 3 DAYS THEN ONCE DAILY 07/18/21   [name], MD  metoprolol tartrate (LOPRESSOR) 50 MG tablet Take 1 tablet (50 mg total) by mouth 2 (two) times daily. 07/14/21   [name], MD  morphine (MS CONTIN) 15 MG 12 hr tablet Take 2 tablets (30 mg total) by mouth every 12 (twelve) hours for 3 days. 07/20/21 07/23/21  [name], [name], NP  morphine (MS CONTIN) 30 MG 12 hr tablet Take 1 tablet (30 mg total) by mouth every 12 (twelve) hours. 07/23/21   [name], [name], NP  ondansetron (ZOFRAN) 8 MG tablet Take 1 tablet (8 mg total) by mouth 2 (two) times daily as needed for refractory nausea /  vomiting. Start on day 3 after chemotherapy. 05/25/21   [name], MD  oxyCODONE (OXY IR/ROXICODONE) 5 MG immediate release tablet Take 1-2 tablets (5-10 mg total) by mouth every 6 (six) hours as needed for severe pain, moderate pain or breakthrough pain. 07/20/21   [name], [name], NP  prochlorperazine (COMPAZINE) 10 MG tablet Take 1 tablet (10 mg total) by mouth every 6 (six) hours as needed (Nausea or vomiting). 05/25/21   [name], MD  Sodium Chloride Flush (NORMAL SALINE FLUSH) 0.9 % SOLN Inject 10 mLs (1 syringe) into lumen twice daily as directed 06/30/21   [name], MD      Allergies    Chlorhexidine gluconate, Shellfish  allergy, and Penicillins    Review of Systems   Review of Systems  All other systems reviewed and are negative.  Physical Exam Updated Vital Signs BP (!) 156/107 (BP Location: Left Arm)    Pulse (!) 110    Temp 98.3 F (36.8 C) (Oral)    Resp 18    SpO2 100%  Physical Exam Vitals and nursing note reviewed.  Constitutional:      Appearance: She is well-developed.  HENT:     Head: Normocephalic and atraumatic.  Cardiovascular:     Rate and Rhythm: Regular rhythm. Tachycardia present.     Heart sounds: No murmur heard. Pulmonary:     Effort: Pulmonary effort is normal. No respiratory distress.     Breath sounds: Normal breath sounds.  Abdominal:     Palpations: Abdomen is soft.     Tenderness: There is no guarding or rebound.     Comments: Colostomy pouch in the left hemiabdomen.  Mild epigastric/LUQ tenderness  Musculoskeletal:        General: No tenderness.     Comments: Dual-lumen PICC line in the right upper extremity.  Skin:    General: Skin is warm and dry.     Findings: No rash.  Neurological:     Mental Status: She is alert and oriented to person, place, and time.  Psychiatric:     Comments: Anxious appearing.  Frequent movements in the stretcher.    ED Results / Procedures / Treatments   Labs (all labs ordered are listed, but only abnormal results are displayed) Labs Reviewed - No data to display  EKG None  Radiology No results found.  Procedures Procedures    Medications Ordered in ED Medications - No data to display  ED Course/ Medical Decision Making/ A&P                           Medical Decision Making Amount and/or Complexity of Data Reviewed Labs: ordered.  Risk OTC drugs. Prescription drug management.   Patient with metastatic colon cancer currently on palliative chemotherapy here for evaluation of anxiety, uncontrolled pain and concern for allergic reaction to chlorhexidine during her infusion earlier today.  On initial assessment  patient distress, anxious appearing.  She was treated with Ativan for her anxiety as well as Dilaudid for her pain.  Her anxiety is significantly improved after Ativan administration but she did require multiple rounds of pain medications for pain control.  Records reviewed in epic, she did have labs performed earlier today that are significant for hypokalemia as well as stable anemia.  She was treated with oral potassium replacement during her infusion.  BMP was rechecked, which has worsening hypokalemia.  She was treated with additional oral potassium as well as IV potassium  replacement.  In terms of her abdominal pain-she states that this is similar to her chronic but poorly controlled abdominal pain.  Discussed with patient options for admission for ongoing pain control versus continuing her medications at home.  After repeat doses in the emergency department she would like to try going home with close outpatient follow-up and return precautions.  Current clinical picture is not consistent with acute infectious process, perforated viscus, pancreatitis, anaphylaxis.  She recently had outpatient imaging performed for similar symptoms.  Do not feel additional imaging is needed at this point in time.  She was found to be tachycardic in the emergency department, she does have a history of same.  On multiple outpatient visits that she has maintained tachycardia up to the 130s.       Final Clinical Impression(s) / ED Diagnoses Final diagnoses:  None    Rx / DC Orders ED Discharge Orders     None         Quintella Reichert, MD 07/21/21 [phone]

## 2021-07-20 NOTE — Progress Notes (Signed)
[facility]  [phone] [phone]   Name: [name] Date: 07/20/2021 MRN: [id number]  DOB: Feb 03, 1986  Patient Care Team: [name], MD as PCP - General (Internal Medicine) [name], MD as PCP - Cardiology (Cardiology) [name], [name] as Physician Assistant (Emergency Medicine) [name], MD as Consulting Physician (Oncology) [name], RN as Oncology Nurse Navigator (Oncology) [name], MD as Consulting Physician (General Surgery) [name], [name], MD as Consulting Physician (Neurology) [name], [name], MD as Consulting Physician (Obstetrics and Gynecology) [name], MD as Consulting Physician (Gastroenterology) [name], [name], NP as Nurse Practitioner (Nurse Practitioner)    INTERVAL HISTORY: Mikayla Rios is a 36 y.o. 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 Recent CT scan on 06/16/21 showed progressive metastatic disease. Palliative ask to see for symptom management.    SOCIAL HISTORY:     reports that she has never smoked. She has never used smokeless tobacco. She reports that she does not currently use alcohol. She reports that she does not use drugs.  ADVANCE DIRECTIVES:    CODE STATUS:   PAST MEDICAL HISTORY: Past Medical History:  Diagnosis Date   Allergy    seasonal   Anemia    Blood infection 1985   Blood transfusion without reported diagnosis    Diverticulitis    Family history of breast cancer    Family history of colon cancer    Family history of stomach cancer    Hypertension    Obesity    Sleep apnea     ALLERGIES:  is allergic to chlorhexidine gluconate, shellfish allergy, and penicillins.  MEDICATIONS:  Current Outpatient Medications  Medication Sig Dispense Refill   [START ON 07/23/2021]  morphine (MS CONTIN) 30 MG 12 hr tablet Take 1 tablet (30 mg total) by mouth every 12 (twelve) hours. 60 tablet 0   oxyCODONE (OXY IR/ROXICODONE) 5 MG immediate release tablet Take 1-2 tablets (5-10 mg total) by mouth every 6 (six) hours as needed for severe pain, moderate pain or breakthrough pain. 90 tablet 0   amLODipine (NORVASC) 10 MG tablet Take 10 mg by mouth daily.     dexamethasone (DECADRON) 4 MG tablet Take 1 tablet (4 mg total) by mouth daily. Take for 3-5 day after chemo 20 tablet 0   diclofenac Sodium (VOLTAREN) 1 % GEL Apply 4 g topically 4 (four) times daily as needed (for mild pain). 4 g 0   DULoxetine (CYMBALTA) 30 MG capsule Take 1 capsule (30 mg total) by mouth daily. 60 capsule 1   gabapentin (NEURONTIN) 300 MG capsule Take 1 capsule (300 mg) by mouth twice daily and Take 2 capsules (600mg ) by mouth at bedtime. 120 capsule 0   heparin lock flush 100 UNIT/ML SOLN injection Use 1 syringe (into each lumen) twice daily 600 mL 0   KLOR-CON M20 20 MEQ tablet TAKE 1 TABLET (20 MEQ TOTAL) BY MOUTH DAILY. TAKE 1 TAB TWICE DAILY FOR 3 DAYS THEN ONCE DAILY 30 tablet 1   metoprolol tartrate (LOPRESSOR) 50 MG tablet Take 1 tablet (50 mg total) by mouth 2 (two) times daily. 180 tablet 2   morphine (MS CONTIN) 15 MG 12 hr tablet Take 2 tablets (30 mg total) by mouth every 12 (twelve) hours for 3 days. 60 tablet 0   ondansetron (ZOFRAN) 8 MG tablet Take 1 tablet (8 mg total)  by mouth 2 (two) times daily as needed for refractory nausea / vomiting. Start on day 3 after chemotherapy. 30 tablet 1   prochlorperazine (COMPAZINE) 10 MG tablet Take 1 tablet (10 mg total) by mouth every 6 (six) hours as needed (Nausea or vomiting). 30 tablet 1   Sodium Chloride Flush (NORMAL SALINE FLUSH) 0.9 % SOLN Inject 10 mLs (1 syringe) into lumen twice daily as directed 1200 mL 2   No current facility-administered medications for this visit.   Facility-Administered Medications Ordered in Other Visits  Medication  Dose Route Frequency Provider Last Rate Last Admin   fluorouracil (ADRUCIL) 4,750 mg in sodium chloride 0.9 % 55 mL chemo infusion  2,400 mg/m2 (Treatment Plan Recorded) Intravenous 1 day or 1 dose [name], MD   4,750 mg at 07/20/21 1614    VITAL SIGNS: There were no vitals taken for this visit. There were no vitals filed for this visit.   Estimated body mass index is 33.71 kg/m as calculated from the following:   Height as of an earlier encounter on 07/20/21: 5\' 3"  (1.6 m).   Weight as of an earlier encounter on 07/20/21: 190 lb 4.8 oz (86.3 kg).   PERFORMANCE STATUS (ECOG) : 2 - Symptomatic, <50% confined to bed   Physical Exam General: NAD Cardiovascular:RRR Pulmonary: clear ant fields Abdomen: soft, tender, + bowel sounds Neurological: Weakness but otherwise nonfocal  IMPRESSION:  I saw Mikayla Rios during her infusion today. Resting comfortably in recliner. No distress noted. Is tolerating treatments. Reports she is feeling better after each cycle and is hopeful things are working for her good!   Neoplams related pain Deaysia reports her pain is somewhat improved. She was initially in a "great place" at the initiation of MS Contin however, it provides some relief but with pain returning more frequently again. She has began using her Oxycodone breakthrough more frequently again where previously she was able to not require as much. Education provided on adjustments to current regimen hopefully to regain better control and minimize need of breakthrough medication. We will increase her MS Contin to 30 mg twice daily, continue gabapentin, and start Oxy IR for breakthrough pain relief given frequency of use and concerns for amount of acetaminophen intake. Will continue to closely monitory and adjust as needed. She verbalized understanding and appreciation.   Denies constipation or nausea not associated around treatment times.   Anxiety  Continues to focus on her health and life one day  at at time. Focusing on the positives, time with family, and remaining hopeful. She has a strong spiritual foundation. She is being followed by Dr. Nelida Meuse which she is appreciative of.   I discussed the importance of continued conversation with family and their medical providers regarding overall plan of care and treatment options, ensuring decisions are within the context of the patients values and GOCs.  PLAN: Will start Oxy IR 5-10 mg every 6 hours as needed for breakthrough pain Increase MS Contin 30 mg BID. Given total use of percocet with consideration of cross tolerance average dose twice daily 33.3 mg.  Gabapentin 300 mg BID and 600mg  QHS Cymbalta 30 mg daily I will plan to see her back in 2-3 weeks in collaboration with future appointments.    Patient expressed understanding and was in agreement with this plan. She also understands that She can call the clinic at any time with any questions, concerns, or complaints.   Time Total: 30 min.   Visit consisted of counseling and education  dealing with the complex and emotionally intense issues of symptom management and palliative care in the setting of serious and potentially life-threatening illness.Greater than 50%  of this time was spent counseling and coordinating care related to the above assessment and plan.  Signed by: Alda Lea, AGPCNP-BC [place]

## 2021-07-21 ENCOUNTER — Telehealth: Payer: Self-pay

## 2021-07-21 ENCOUNTER — Telehealth: Payer: Self-pay | Admitting: Licensed Clinical Social Worker

## 2021-07-21 ENCOUNTER — Encounter: Payer: Self-pay | Admitting: Licensed Clinical Social Worker

## 2021-07-21 LAB — BASIC METABOLIC PANEL
Anion gap: 10 (ref 5–15)
BUN: 11 mg/dL (ref 6–20)
CO2: 29 mmol/L (ref 22–32)
Calcium: 9.1 mg/dL (ref 8.9–10.3)
Chloride: 97 mmol/L — ABNORMAL LOW (ref 98–111)
Creatinine, Ser: 0.38 mg/dL — ABNORMAL LOW (ref 0.44–1.00)
GFR, Estimated: >60 mL/min (ref >60)
Glucose, Bld: 113 mg/dL — ABNORMAL HIGH (ref 70–99)
Potassium: 2.8 mmol/L — ABNORMAL LOW (ref 3.5–5.1)
Sodium: 136 mmol/L (ref 135–145)

## 2021-07-21 MED ORDER — POTASSIUM CHLORIDE 20 MEQ PO PACK
40.0000 meq | PACK | Freq: Once | ORAL | Status: AC
  Administered 2021-07-21: 40 meq via ORAL
  Filled 2021-07-21: qty 2

## 2021-07-21 MED ORDER — HYDROMORPHONE HCL 1 MG/ML IJ SOLN
1.0000 mg | Freq: Once | INTRAMUSCULAR | Status: AC
  Administered 2021-07-21: 1 mg via INTRAVENOUS
  Filled 2021-07-21: qty 1

## 2021-07-21 MED ORDER — MORPHINE SULFATE ER 15 MG PO TBCR
30.0000 mg | EXTENDED_RELEASE_TABLET | Freq: Two times a day (BID) | ORAL | Status: DC
  Administered 2021-07-21: 30 mg via ORAL
  Filled 2021-07-21: qty 2

## 2021-07-21 MED ORDER — POTASSIUM CHLORIDE 10 MEQ/100ML IV SOLN
10.0000 meq | INTRAVENOUS | Status: AC
  Administered 2021-07-21 (×2): 10 meq via INTRAVENOUS
  Filled 2021-07-21 (×2): qty 100

## 2021-07-21 MED ORDER — ALUM & MAG HYDROXIDE-SIMETH 200-200-20 MG/5ML PO SUSP
30.0000 mL | Freq: Once | ORAL | Status: AC
  Administered 2021-07-21: 30 mL via ORAL
  Filled 2021-07-21: qty 30

## 2021-07-21 MED ORDER — SODIUM CHLORIDE 0.9 % IV BOLUS
500.0000 mL | Freq: Once | INTRAVENOUS | Status: AC
  Administered 2021-07-21: 500 mL via INTRAVENOUS

## 2021-07-21 MED ORDER — HYDROMORPHONE HCL 2 MG/ML IJ SOLN
2.0000 mg | Freq: Once | INTRAMUSCULAR | Status: AC
  Administered 2021-07-21: 2 mg via INTRAVENOUS
  Filled 2021-07-21: qty 1

## 2021-07-21 NOTE — Telephone Encounter (Signed)
Transition Care Management Unsuccessful Follow-up Telephone Call  Date of discharge and from where:  07/20/2021 [facility]   Attempts:  1st Attempt  Reason for unsuccessful TCM follow-up call:  Unable to leave message

## 2021-07-22 ENCOUNTER — Other Ambulatory Visit: Payer: Self-pay

## 2021-07-22 ENCOUNTER — Inpatient Hospital Stay: Payer: BC Managed Care – PPO

## 2021-07-22 VITALS — BP 121/94 | HR 103 | Temp 98.3°F | Resp 17

## 2021-07-22 DIAGNOSIS — Z452 Encounter for adjustment and management of vascular access device: Secondary | ICD-10-CM

## 2021-07-22 DIAGNOSIS — Z800 Family history of malignant neoplasm of digestive organs: Secondary | ICD-10-CM | POA: Diagnosis not present

## 2021-07-22 DIAGNOSIS — D649 Anemia, unspecified: Secondary | ICD-10-CM | POA: Diagnosis not present

## 2021-07-22 DIAGNOSIS — E669 Obesity, unspecified: Secondary | ICD-10-CM | POA: Diagnosis not present

## 2021-07-22 DIAGNOSIS — F419 Anxiety disorder, unspecified: Secondary | ICD-10-CM | POA: Diagnosis not present

## 2021-07-22 DIAGNOSIS — Z79899 Other long term (current) drug therapy: Secondary | ICD-10-CM | POA: Diagnosis not present

## 2021-07-22 DIAGNOSIS — Z803 Family history of malignant neoplasm of breast: Secondary | ICD-10-CM | POA: Diagnosis not present

## 2021-07-22 DIAGNOSIS — I10 Essential (primary) hypertension: Secondary | ICD-10-CM | POA: Diagnosis not present

## 2021-07-22 DIAGNOSIS — Z5111 Encounter for antineoplastic chemotherapy: Secondary | ICD-10-CM | POA: Diagnosis not present

## 2021-07-22 DIAGNOSIS — R6510 Systemic inflammatory response syndrome (SIRS) of non-infectious origin without acute organ dysfunction: Secondary | ICD-10-CM | POA: Diagnosis not present

## 2021-07-22 DIAGNOSIS — G893 Neoplasm related pain (acute) (chronic): Secondary | ICD-10-CM | POA: Diagnosis not present

## 2021-07-22 DIAGNOSIS — C187 Malignant neoplasm of sigmoid colon: Secondary | ICD-10-CM | POA: Diagnosis not present

## 2021-07-22 MED ORDER — SODIUM CHLORIDE 0.9% FLUSH
10.0000 mL | Freq: Once | INTRAVENOUS | Status: AC
  Administered 2021-07-22: 10 mL via INTRAVENOUS

## 2021-07-22 MED ORDER — HEPARIN SOD (PORK) LOCK FLUSH 100 UNIT/ML IV SOLN
500.0000 [IU] | Freq: Once | INTRAVENOUS | Status: AC
  Administered 2021-07-22: 500 [IU] via INTRAVENOUS

## 2021-07-23 DIAGNOSIS — C187 Malignant neoplasm of sigmoid colon: Secondary | ICD-10-CM | POA: Diagnosis not present

## 2021-07-23 DIAGNOSIS — Z79891 Long term (current) use of opiate analgesic: Secondary | ICD-10-CM | POA: Diagnosis not present

## 2021-07-23 DIAGNOSIS — Z452 Encounter for adjustment and management of vascular access device: Secondary | ICD-10-CM | POA: Diagnosis not present

## 2021-07-23 DIAGNOSIS — E440 Moderate protein-calorie malnutrition: Secondary | ICD-10-CM | POA: Diagnosis not present

## 2021-07-23 DIAGNOSIS — I10 Essential (primary) hypertension: Secondary | ICD-10-CM | POA: Diagnosis not present

## 2021-07-23 DIAGNOSIS — Z433 Encounter for attention to colostomy: Secondary | ICD-10-CM | POA: Diagnosis not present

## 2021-07-23 DIAGNOSIS — Z6837 Body mass index (BMI) 37.0-37.9, adult: Secondary | ICD-10-CM | POA: Diagnosis not present

## 2021-07-23 DIAGNOSIS — Z9049 Acquired absence of other specified parts of digestive tract: Secondary | ICD-10-CM | POA: Diagnosis not present

## 2021-07-23 DIAGNOSIS — D62 Acute posthemorrhagic anemia: Secondary | ICD-10-CM | POA: Diagnosis not present

## 2021-07-23 DIAGNOSIS — C189 Malignant neoplasm of colon, unspecified: Secondary | ICD-10-CM | POA: Diagnosis not present

## 2021-07-23 DIAGNOSIS — Z4801 Encounter for change or removal of surgical wound dressing: Secondary | ICD-10-CM | POA: Diagnosis not present

## 2021-07-23 DIAGNOSIS — Z483 Aftercare following surgery for neoplasm: Secondary | ICD-10-CM | POA: Diagnosis not present

## 2021-07-23 DIAGNOSIS — G4730 Sleep apnea, unspecified: Secondary | ICD-10-CM | POA: Diagnosis not present

## 2021-07-23 DIAGNOSIS — Z791 Long term (current) use of non-steroidal anti-inflammatories (NSAID): Secondary | ICD-10-CM | POA: Diagnosis not present

## 2021-07-23 DIAGNOSIS — E876 Hypokalemia: Secondary | ICD-10-CM | POA: Diagnosis not present

## 2021-07-23 DIAGNOSIS — E669 Obesity, unspecified: Secondary | ICD-10-CM | POA: Diagnosis not present

## 2021-07-23 DIAGNOSIS — K5792 Diverticulitis of intestine, part unspecified, without perforation or abscess without bleeding: Secondary | ICD-10-CM | POA: Diagnosis not present

## 2021-07-24 ENCOUNTER — Encounter: Payer: BC Managed Care – PPO | Admitting: Registered Nurse

## 2021-07-24 ENCOUNTER — Ambulatory Visit (INDEPENDENT_AMBULATORY_CARE_PROVIDER_SITE_OTHER): Payer: BC Managed Care – PPO | Admitting: Psychologist

## 2021-07-24 DIAGNOSIS — F320 Major depressive disorder, single episode, mild: Secondary | ICD-10-CM | POA: Diagnosis not present

## 2021-07-24 NOTE — Progress Notes (Signed)
[facility] Counselor/Therapist Progress Note  Patient ID: Mikayla Rios, MRN: [id number],    Date: 07/24/2021  Time Spent: 12:05 pm to 12:21 pm; total time: 16 minutes  This session was held via video webex teletherapy due to the coronavirus risk at this time. The patient consented to video teletherapy and was located at her home during this session. She is aware it is the responsibility of the patient to secure confidentiality on her end of the session. The provider was in a private home office for the duration of this session. Limits of confidentiality were discussed with the patient.   Treatment Type: Individual Therapy  Reported Symptoms: Described herself as fair with depressive symptoms    Mental Status Exam: Appearance:  Well Groomed     Behavior: Appropriate  Motor: Normal  Speech/Language:  Clear and Coherent  Affect: Appropriate  Mood: normal  Thought process: normal  Thought content:   WNL  Sensory/Perceptual disturbances:   WNL  Orientation: oriented to person, place, and time/date  Attention: Good  Concentration: Good  Memory: WNL  Fund of knowledge:  Good  Insight:   Good  Judgment:  Good  Impulse Control: Good   Risk Assessment: Danger to Self:  No Self-injurious Behavior: No Danger to Others: No Duty to Warn:no Physical Aggression / Violence:No  Access to Firearms a concern: No  Gang Involvement:No   Subjective: Patient described herself as fair and acknowledged that she was experiencing fatigue that she associated with chemotherapy treatment. Continuing to talk, patient stated that she had met with Dr. Burr Medico to review the biopsy results. She was informed at the time that the cancer had returned. She processed thoughts and emotions related to the cancer returning. Patient described herself as doing better than she anticipated regarding the news. She briefly talked about her unexpected trip to the ED. From there, she talked about how despite  experiencing fatigue she is still able to do some activities with her family, which is meaningful to her. She processed thoughts and emotions. She asked to follow up in a month. She denied suicidal and homicidal ideation.   Interventions:  Worked on developing a therapeutic relationship with the patient using active listening and reflective statements. Provided emotional support using empathy and validation. Reflected on events since the last session. Normalized and validated expressed thoughts and emotions. Briefly explored the fatigue that patient was experiencing. Processed thoughts and emotions related to the cancer coming back. Processed whether or not patient is able to still participate in meaningful activities with her family. Explored what types of activities patient is able to still participate in. Explored family's response to the cancer coming back. Discussed patient's current needs for counseling. Praised patient for doing well despite the information that was provided. Praised for using coping strategies and staying active.  Provided empathic statements. Assessed for suicidal and homicidal ideation.  Homework: NA  Next Session: Emotional support  Diagnosis: F32.0 major depressive affective disorder, single episode, mild   Plan:   Client Abilities: Friendly and easy to develop rapport  Client Preferences: ACT and CBT  Client statement of Needs: Emotional support and strategies to move forward  Treatment Level: Outpatient   Goals Work through the grieving process and face reality of own death Accept emotional support from others around them Live life to the fullest, event though time may be limited Become as knowledgeable about the medical condition  Reduce fear, anxiety about the health condition  Accept the illness Accept the role of psychological  and behavioral factors  Stabilize anxiety level wile increasing ability to function Learn and implement coping skills that  result in a reduction of anxiety  Alleviate depressive symptoms Recognize, accept, and cope with depressive feelings Develop healthy thinking patterns Develop healthy interpersonal relationships  Objectives target date for all objectives is 06/17/2022 Identify feelings associated with the illness Family members share with each other feelings Identify the losses or limitations that have been experienced Verbalize acceptance of the reality of the medical condition Commit to learning and implement a proactive approach to managing personal stresses Verbalize an understanding of the medical condition Work with therapist to develop a plan for coping with stress Learn and implement skills for managing stress Engage in social, productive activities that are possible Engage in faith based activities implement positive imagery Identify coping skills and sources of emotional support Patient's partner and family members verbalize their fears regarding severity of health condition Identify sources of emotional distress  Learning and implement calming skills to reduce overall anxiety Learn and implement problem solving strategies Identify and engage in pleasant activities Learning and implement personal and interpersonal skills to reduce anxiety and improve interpersonal relationships Learn to accept limitations in life and commit to tolerating, rather than avoiding, unpleasant emotions while accomplishing meaningful goals Identify major life conflicts from the past and present that form the basis for present anxiety Learn and implement behavioral strategies Verbalize an understanding and resolution of current interpersonal problems Learn and implement problem solving and decision making skills Learn and implement conflict resolution skills to resolve interpersonal problems Verbalize an understanding of healthy and unhealthy emotions verbalize insight into how past relationships may be influence  current experiences with depression Use mindfulness and acceptance strategies and increase value based behavior  Increase hopeful statements about the future.   Interventions Teach about stress and ways to handle stress Assist the patient in developing a coping action plan for stressors Conduct skills based training for coping strategies Train problem focused skills Sort out what activities the individual can do Encourage patient to rely upon his/her spiritual faith Teach the patient to use guided imagery Probe and evaluate family's ability to provide emotional support Allow family to share their fears Assist the patient in identifying, sorting through, and verbalizing the various feelings generated by his/her medical condition Meet with family members  Ask patient list out limitations  Use stress inoculation training  Use Acceptance and Commitment Therapy to help client accept uncomfortable realities in order to accomplish value-consistent goals Reinforce the client's insight into the role of his/her past emotional pain and present anxiety  Discuss examples demonstrating that unrealistic worry overestimates the probability of threats and underestimate patient's ability  Assist the patient in analyzing his or her worries Help patient understand that avoidance is reinforcing  Behavioral activation help the client explore the relationship, nature of the dispute,  Help the client develop new interpersonal skills and relationships Conduct Problem so living therapy Teach conflict resolution skills Use a process-experiential approach Conduct TLDP Conduct ACT  The patient and clinician reviewed the treatment plan on 06/29/2021.   Conception Chancy, PsyD                  Conception Chancy, PsyD               Conception Chancy, PsyD

## 2021-07-27 ENCOUNTER — Other Ambulatory Visit: Payer: Self-pay | Admitting: Hematology

## 2021-07-29 ENCOUNTER — Encounter: Payer: Self-pay | Admitting: Licensed Clinical Social Worker

## 2021-07-29 ENCOUNTER — Encounter: Payer: Self-pay | Admitting: Hematology

## 2021-07-29 DIAGNOSIS — Z483 Aftercare following surgery for neoplasm: Secondary | ICD-10-CM | POA: Diagnosis not present

## 2021-07-29 DIAGNOSIS — Z791 Long term (current) use of non-steroidal anti-inflammatories (NSAID): Secondary | ICD-10-CM | POA: Diagnosis not present

## 2021-07-29 DIAGNOSIS — Z433 Encounter for attention to colostomy: Secondary | ICD-10-CM | POA: Diagnosis not present

## 2021-07-29 DIAGNOSIS — C187 Malignant neoplasm of sigmoid colon: Secondary | ICD-10-CM | POA: Diagnosis not present

## 2021-07-29 DIAGNOSIS — K5792 Diverticulitis of intestine, part unspecified, without perforation or abscess without bleeding: Secondary | ICD-10-CM | POA: Diagnosis not present

## 2021-07-29 DIAGNOSIS — Z9049 Acquired absence of other specified parts of digestive tract: Secondary | ICD-10-CM | POA: Diagnosis not present

## 2021-07-29 DIAGNOSIS — D62 Acute posthemorrhagic anemia: Secondary | ICD-10-CM | POA: Diagnosis not present

## 2021-07-29 DIAGNOSIS — Z6837 Body mass index (BMI) 37.0-37.9, adult: Secondary | ICD-10-CM | POA: Diagnosis not present

## 2021-07-29 DIAGNOSIS — I10 Essential (primary) hypertension: Secondary | ICD-10-CM | POA: Diagnosis not present

## 2021-07-29 DIAGNOSIS — Z4801 Encounter for change or removal of surgical wound dressing: Secondary | ICD-10-CM | POA: Diagnosis not present

## 2021-07-29 DIAGNOSIS — Z79891 Long term (current) use of opiate analgesic: Secondary | ICD-10-CM | POA: Diagnosis not present

## 2021-07-29 DIAGNOSIS — E669 Obesity, unspecified: Secondary | ICD-10-CM | POA: Diagnosis not present

## 2021-07-29 DIAGNOSIS — Z452 Encounter for adjustment and management of vascular access device: Secondary | ICD-10-CM | POA: Diagnosis not present

## 2021-07-29 DIAGNOSIS — C189 Malignant neoplasm of colon, unspecified: Secondary | ICD-10-CM | POA: Diagnosis not present

## 2021-07-29 DIAGNOSIS — G4730 Sleep apnea, unspecified: Secondary | ICD-10-CM | POA: Diagnosis not present

## 2021-07-29 DIAGNOSIS — E440 Moderate protein-calorie malnutrition: Secondary | ICD-10-CM | POA: Diagnosis not present

## 2021-07-29 DIAGNOSIS — E876 Hypokalemia: Secondary | ICD-10-CM | POA: Diagnosis not present

## 2021-07-29 NOTE — Telephone Encounter (Signed)
Attempted to reach Ms. Lashomb x3 with genetic test results, voicemail was full. Sent patient message. Will follow up with letter if no response to message.

## 2021-07-30 ENCOUNTER — Encounter (HOSPITAL_COMMUNITY): Payer: Self-pay | Admitting: Hematology

## 2021-07-31 MED FILL — Dexamethasone Sodium Phosphate Inj 100 MG/10ML: INTRAMUSCULAR | Qty: 1 | Status: AC

## 2021-08-02 NOTE — Progress Notes (Signed)
[place]   [phone] [phone]   Clinic Follow up Note   Patient Care Team: [name], MD as PCP - General (Internal Medicine) [name], MD as PCP - Cardiology (Cardiology) [name], [name] as Physician Assistant (Emergency Medicine) [name], MD as Consulting Physician (Oncology) [name], RN as Oncology Nurse Navigator (Oncology) [name], MD as Consulting Physician (General Surgery) [name], [name], MD as Consulting Physician (Neurology) [name], [name], MD as Consulting Physician (Obstetrics and Gynecology) [name], MD as Consulting Physician (Gastroenterology) [name], [name], NP as Nurse Practitioner (Nurse Practitioner) 08/03/2021  CHIEF COMPLAINT: Follow up sigmoid colon cancer  SUMMARY OF ONCOLOGIC HISTORY: Oncology History Overview Note   Cancer Staging  Malignant neoplasm of sigmoid colon [facility]) Staging form: Colon and Rectum, AJCC 8th Edition - Pathologic stage from 04/15/2021: Stage IIC (pT4b, pN0, cM0) - Signed by [name], MD on 06/12/2021    Malignant neoplasm of sigmoid colon [facility])   Initial Diagnosis   Malignant neoplasm of sigmoid colon ([place])   11/04/2020 Imaging   EXAM: CT ABDOMEN AND PELVIS WITHOUT CONTRAST  IMPRESSION: 1. Perforating descending colonic diverticulitis with multiple abdominopelvic gas and fluid collections, measuring up to 5.7 cm and further described above. 2. Cholelithiasis without findings of acute cholecystitis. 3. 3.6 cm benign left adrenal adenoma. 4. Leiomyomatous uterus. 5. 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   11/07/2020 Imaging   EXAM: CT ABDOMEN AND PELVIS WITHOUT CONTRAST  IMPRESSION: Continued wall thickening is seen involving descending colon suggesting infectious or inflammatory colitis or  perforated diverticulitis. There is an adjacent fluid collection measuring 7.0 x 5.0 cm consistent with abscess which is significantly enlarged compared to prior exam. Adjacent to the abscess, there is a severely thickened small bowel loop most consistent with secondary inflammation.   5.2 x 3.5 cm fluid collection is noted within the left psoas muscle consistent with abscess which is significantly enlarged compared to prior exam. 4.4 x 3.8 cm fluid collection consistent with abscess is noted in the left retroperitoneal region which is also enlarged compared to prior exam.   Fibroid uterus.   Cholelithiasis.   11/27/2020 Imaging   EXAM: CT ABDOMEN AND PELVIS WITHOUT CONTRAST  IMPRESSION: 1. Significantly interval decreased size of the previously visualized left retroperitoneal and left anterior mid abdominal fluid collections. There is persistent fat stranding and mural thickening of the mid descending colon at this level. 2. Unchanged leiomyomatous uterus. 3. Unchanged cholelithiasis.   12/11/2020 Imaging   EXAM: CT ABDOMEN AND PELVIS WITHOUT CONTRAST  IMPRESSION: 1. Stable inflammatory changes centered around the distal descending colon in the left lower abdomen. Pericolonic inflammatory changes have minimally changed since 11/27/2020. Small pocket of gas medial to the colon is probably associated with a fistula or small residual abscess collection. 2. Stable position of the two percutaneous drains. The more anterior drain may be extending through a portion of the small bowel. 3. Cholelithiasis. 4. Fibroid uterus. Cannot exclude an ovarian/adnexal cystic structure near the uterine fundus. 5. Left adrenal adenoma.   01/07/2021 Imaging   EXAM: CT ABDOMEN AND PELVIS WITH CONTRAST  IMPRESSION: 1. No new abscesses identified. Similar degree of soft tissue thickening seen in the left pericolonic region. The degree of persistent soft tissues thickening in the pericolonic space  further raises suspicions for malignancy. Further evaluation with colonoscopy should be performed. 2. Left retroperitoneal abscess drain unchanged in  position. Anterior left abdominal drain again seen terminating within small bowel loop. 3. 2.6 cm mildly sclerotic lesion noted in the L2 vertebral body. Further evaluation with contrast enhanced lumbar spine MRI should be performed.   02/04/2021 Imaging   EXAM: CT ABDOMEN AND PELVIS WITH CONTRAST  IMPRESSION: No new abdominopelvic collections or abscess development in the 1 month interval.   Left anterior drain remains within a loop of small bowel, unchanged.   Left lateral abscess drain remains in the retroperitoneal space adjacent to the iliopsoas muscle with a small amount of residual fluid and air but no measurable collection.   Stable soft tissue prominence and pericolonic strandy edema/inflammation about the left descending colon compatible with residual diverticulitis/colitis. Difficult to exclude underlying transmural lesion.   04/01/2021 Imaging   EXAM: CT ABDOMEN AND PELVIS WITH CONTRAST  IMPRESSION: There appears to be significant enhancement and wall thickening involving the descending colon with some degree of traction and involvement of adjacent small bowel loops. This is consistent with the history of diverticulitis and perforation. Stable position of percutaneous drainage catheter is seen adjacent to left psoas muscle with no significant residual fluid remaining. The other percutaneous drainage catheter that was previously noted to be within small bowel loop on prior exam in the left lower quadrant, has significantly retracted and appears to be outside of the peritoneal space at this time.   Since the prior exam, there does appear to be some degree of rotation involving mesenteric vessels and structures in the right lower quadrant, suggesting partial volvulus or malrotation. There is the interval development of  several lymph nodes in this area, most likely inflammatory or reactive in etiology. Mild amount of free fluid is also noted in the pelvis. However, no significant bowel wall thickening or dilatation is seen in this area. These results will be called to the ordering clinician or representative by the Radiologist Assistant, and communication documented in the PACS or zVision Dashboard.   Stable uterine fibroid.   Hepatic steatosis.   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   Cholelithiasis.   04/10/2021 Imaging   EXAM: CT ANGIOGRAPHY CHEST CT ABDOMEN AND PELVIS WITH CONTRAST  IMPRESSION: 1. Again seen are findings compatible with descending colon diverticulitis with perforation. Free air and inflammation have increased in the interval. 2. New lobulated enhancing fluid collection posterior to this segment of inflamed colon measuring 8.5 x 3.5 x 10.0 cm. This collection now invades the adjacent iliopsoas muscle as well as extends through the left lateral abdominal wall. The tip of the drainage catheter is in this collection. Findings are compatible with abscess. 3. Anterior left percutaneous drainage catheter tip has been pulled back and is now within the subcutaneous tissues. 4. Trace free fluid. 5. No pulmonary embolism.  No acute cardiopulmonary process. 6. Cholelithiasis. 7. Fatty infiltration of the liver.   04/15/2021 Definitive Surgery   FINAL MICROSCOPIC DIAGNOSIS:   A. SMALL BOWEL, RESECTION:  - Adenocarcinoma.  - No carcinoma identified in 1 lymph node.   B. PERFORATED LEFT COLON, RESECTION:  - Moderately differentiated colonic adenocarcinoma.  - Tumor extends into pericolonic adipose tissue, and is strongly  suggestive of invasion into small bowel.  See oncology table/comments.  - No carcinoma identified in 4 lymph nodes (0/4).  - Tubular adenoma with high-grade dysplasia, 1.  - Tubular adenomas with low grade dysplasia, 3.   Comments: The size of the tumor is  difficult to estimate secondary to the disrupted nature of the specimen, as well as the  infiltrative nature of the tumor.  Tumor can be identified as definitely invading into the pericolonic adipose tissue (block B4), but given the extreme disruption of the tissue, and the involvement of the small bowel by what appears to be a colonic adenocarcinoma, I favor perforation of the large bowel with direct invasion into the small bowel.  Accordingly, I believe this is best regarded as a pT4b lesion.   ADDENDUM:  Mismatch Repair Protein (IHC)  SUMMARY INTERPRETATION: ABNORMAL  There is loss of the major MMR protein [name]6: This indicates a high probability that a hereditary germline mutation is present and referral to genetic counseling is warranted. It is recommended that the loss of protein expression be correlated with molecular based microsatellite instability testing.   IHC EXPRESSION RESULTS  TEST           RESULT  MLH1:          Preserved nuclear expression  [name]2:          Preserved nuclear expression  [name]6:          LOSS OF NUCLEAR EXPRESSION  PMS2:          Preserved nuclear expression    04/15/2021 Cancer Staging   Staging form: Colon and Rectum, AJCC 8th Edition - Pathologic stage from 04/15/2021: Stage IIC (pT4b, pN0, cM0) - Signed by [name], MD on 06/12/2021 Stage prefix: Initial diagnosis Total positive nodes: 0 Histologic grading system: 4 grade system Histologic grade (G): G2 Residual tumor (R): R0 - None    04/29/2021 Imaging   EXAM: CT ABDOMEN AND PELVIS WITH CONTRAST  IMPRESSION: Continued left retroperitoneal abscesses inferior to the left kidney, involving the left psoas muscle and left abdominal wall musculature. Overall size is decreased since prior study. Interval removal of left lower quadrant abscess drainage catheter.   New fluid collection in the cul-de-sac and wrapping around the uterus concerning for abscess.   Cholelithiasis.   Small left pleural effusion.   Bibasilar atelectasis.   Stable left adrenal adenoma.  ADDENDUM: After discussing the case with Dr. Dwaine Gale in interventional radiology, it was noted that the fluid in the pelvis originally thought to be in the cul-de-sac is likely within the vagina, best seen on sagittal imaging. Recommend speculum exam.   Also, in the left lateral wall in the area of prior abscess in the lateral wall abdominal musculature, some of the abdomen soft tissue appears to be enhancing. While this could be infectious, cannot exclude tumor seeding in the left lateral abdominal wall, measuring 6.9 x 3.8 cm on image 64 of series 2.   06/16/2021 Imaging   EXAM: CT ABDOMEN AND PELVIS WITH CONTRAST  IMPRESSION: Increased size of bulky soft tissue masses involving the left psoas muscle and left lateral abdominal wall soft tissues, consistent with progressive metastatic disease.   Significant progression of multiple peritoneal masses throughout the abdomen and pelvis, consistent with peritoneal carcinomatosis.   New mild retroperitoneal lymphadenopathy, consistent with metastatic disease.   Stable uterine fibroid and left adrenal adenoma.   Cholelithiasis, without evidence of cholecystitis.   06/22/2021 -  Chemotherapy   Patient is on Treatment Plan : COLORECTAL FOLFOX q14d x 6 months     06/22/2021 Tumor Marker   Patient's tumor was tested for the following markers: CEA. Results of the tumor marker test revealed 87.41.   07/10/2021 Pathology Results   FINAL MICROSCOPIC DIAGNOSIS:   A. PERITONEAL MASS, LEFT UPPER ABDOMINAL QUADRANT, BIOPSY:  -  Metastatic adenocarcinoma with necrosis, histologically consistent  with colon primary.      CURRENT THERAPY: FOLFOX, q2 weeks, starting 06/22/21  INTERVAL HISTORY: Mikayla Rios returns for follow up and treatment as scheduled. Last seen by Dr. Burr Medico 07/20/21 and completed cycle 3 FOLFOX.  On the day of the infusion PICC line was flushed with chlorhexidine, she subsequently  developed an allergic reaction and panic attack, and went to the ED. she was given Ativan for anxiety and multiple doses of pain medicine as well as IV potassium and discharged home.  Cold sensitivity subsided later that day, denies neuropathy in the absence of cold exposure.  She continues to have middle and left abdominal pain.  The left abdominal wall mass fluctuates in size and location. Takes MS Contin twice daily and 1-2 Percocets every 3-4 hours.  Currently 6/10. Pain has improved on chemo, but still occasionally limits p.o. intake.  She has mild nausea and gas pain, relieved with Gas-X.  She vomited twice after pump d/c.  Formed bowel movements daily.  She continues working with PT, activity is improving at home.  Otherwise denies fever, chills, cough, chest pain, dyspnea, leg edema, mucositis, rash, or any other new specific complaints.   MEDICAL HISTORY:  Past Medical History:  Diagnosis Date   Allergy    seasonal   Anemia    Blood infection 1985   Blood transfusion without reported diagnosis    Diverticulitis    Family history of breast cancer    Family history of colon cancer    Family history of stomach cancer    Hypertension    Obesity    Sleep apnea     SURGICAL HISTORY: Past Surgical History:  Procedure Laterality Date   COLECTOMY WITH COLOSTOMY CREATION/HARTMANN PROCEDURE N/A 04/15/2021   Procedure: COLOSTOMY CREATION/HARTMANN PROCEDURE;  Surgeon: [name], MD;  Location: [facility];  Service: General;  Laterality: N/A;   IR CATHETER TUBE CHANGE  12/12/2020   IR CATHETER TUBE CHANGE  01/27/2021   IR CATHETER TUBE CHANGE  02/19/2021   IR CATHETER TUBE CHANGE  04/13/2021   IR RADIOLOGIST EVAL & MGMT  11/27/2020   IR RADIOLOGIST EVAL & MGMT  12/11/2020   IR RADIOLOGIST EVAL & MGMT  01/07/2021   IR RADIOLOGIST EVAL & MGMT  02/04/2021   IR SINUS/FIST TUBE CHK-NON GI  12/12/2020   IR SINUS/FIST TUBE CHK-NON GI  02/19/2021   LAPAROSCOPIC PARTIAL COLECTOMY N/A 04/15/2021    Procedure: LAPAROSCOPIC ASSISTED HARTMANN RESECTION;  Surgeon: [name], MD;  Location: [facility];  Service: General;  Laterality: N/A;    I have reviewed the social history and family history with the patient and they are unchanged from previous note.  ALLERGIES:  is allergic to chlorhexidine gluconate, shellfish allergy, and penicillins.  MEDICATIONS:  Current Outpatient Medications  Medication Sig Dispense Refill   amLODipine (NORVASC) 10 MG tablet Take 10 mg by mouth daily.     dexamethasone (DECADRON) 4 MG tablet Take 1 tablet (4 mg total) by mouth daily. Take for 3-5 day after chemo 20 tablet 0   diclofenac Sodium (VOLTAREN) 1 % GEL Apply 4 g topically 4 (four) times daily as needed (for mild pain). 4 g 0   DULoxetine (CYMBALTA) 30 MG capsule Take 1 capsule (30 mg total) by mouth daily. 60 capsule 1   gabapentin (NEURONTIN) 300 MG capsule Take 1 capsule (300 mg) by mouth twice daily and Take 2 capsules (63m) by mouth at bedtime. 120 capsule 0   heparin lock flush 100 UNIT/ML SOLN injection  Use 1 syringe (into each lumen) twice daily 600 mL 0   KLOR-CON M20 20 MEQ tablet TAKE 1 TABLET (20 MEQ TOTAL) BY MOUTH DAILY. TAKE 1 TAB TWICE DAILY FOR 3 DAYS THEN ONCE DAILY 30 tablet 1   metoprolol tartrate (LOPRESSOR) 50 MG tablet Take 1 tablet (50 mg total) by mouth 2 (two) times daily. 180 tablet 2   morphine (MS CONTIN) 30 MG 12 hr tablet Take 1 tablet (30 mg total) by mouth every 8 (eight) hours. 60 tablet 0   ondansetron (ZOFRAN) 8 MG tablet Take 1 tablet (8 mg total) by mouth 2 (two) times daily as needed for refractory nausea / vomiting. Start on day 3 after chemotherapy. 30 tablet 1   oxyCODONE (OXY IR/ROXICODONE) 5 MG immediate release tablet Take 1-2 tablets (5-10 mg total) by mouth every 6 (six) hours as needed for severe pain, moderate pain or breakthrough pain. 90 tablet 0   prochlorperazine (COMPAZINE) 10 MG tablet Take 1 tablet (10 mg total) by mouth every 6 (six) hours as  needed (Nausea or vomiting). 30 tablet 1   Sodium Chloride Flush (NORMAL SALINE FLUSH) 0.9 % SOLN Inject 10 mLs (1 syringe) into lumen twice daily as directed 1200 mL 2   No current facility-administered medications for this visit.   Facility-Administered Medications Ordered in Other Visits  Medication Dose Route Frequency Provider Last Rate Last Admin   fluorouracil (ADRUCIL) 4,750 mg in sodium chloride 0.9 % 55 mL chemo infusion  2,400 mg/m2 (Treatment Plan Recorded) Intravenous 1 day or 1 dose [name], MD   4,750 mg at 08/03/21 1539    PHYSICAL EXAMINATION: ECOG PERFORMANCE STATUS: 1-2  Vitals:   08/03/21 1032  BP: (!) 134/101  Pulse: (!) 105  Resp: 18  Temp: 98.2 F (36.8 C)  SpO2: 100%   Filed Weights   08/03/21 1032  Weight: 182 lb 8 oz (82.8 kg)    GENERAL:alert, no distress and comfortable SKIN: No rash EYES:  sclera clear LUNGS: clear with normal breathing effort HEART: Tachycardic, regular rhythm, no murmurs and no lower extremity edema ABDOMEN:abdomen soft, non-tender and normal bowel sounds.  Left colostomy noted.  Large left lateral abdominal wall mass NEURO: alert & oriented x 3 with fluent speech PICC line without erythema  LABORATORY DATA:  I have reviewed the data as listed CBC Latest Ref Rng & Units 08/03/2021 07/20/2021 07/10/2021  WBC 4.0 - 10.5 K/uL 8.0 9.4 6.1  Hemoglobin 12.0 - 15.0 g/dL 10.3(L) 10.8(L) 10.1(L)  Hematocrit 36.0 - 46.0 % 32.8(L) 32.6(L) 32.0(L)  Platelets 150 - 400 K/uL 456(H) 567(H) 488(H)     CMP Latest Ref Rng & Units 08/03/2021 07/20/2021 07/20/2021  Glucose 70 - 99 mg/dL 97 113(H) 104(H)  BUN 6 - 20 mg/dL 16 11 11   Creatinine 0.44 - 1.00 mg/dL 0.52 0.38(L) 0.47  Sodium 135 - 145 mmol/L 142 136 143  Potassium 3.5 - 5.1 mmol/L 3.2(L) 2.8(L) 2.9(L)  Chloride 98 - 111 mmol/L 99 97(L) 100  CO2 22 - 32 mmol/L 34(H) 29 34(H)  Calcium 8.9 - 10.3 mg/dL 9.3 9.1 9.2  Total Protein 6.5 - 8.1 g/dL 7.3 - 7.2  Total Bilirubin 0.3 - 1.2  mg/dL 0.4 - 0.3  Alkaline Phos 38 - 126 U/L 52 - 55  AST 15 - 41 U/L 11(L) - 10(L)  ALT 0 - 44 U/L 5 - <5      RADIOGRAPHIC STUDIES: I have personally reviewed the radiological images as listed and agreed with the  findings in the report. No results found.   ASSESSMENT & PLAN: Mikayla Rios is a 36 y.o. female with    1. 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 She was found to have diverticulitis with perforation. She also developed an abscess requiring 2 drains. She continued to have abdominal pain and drainage, undergoing drain exchange three times. -she developed worsening weakness and was admitted on 04/10/21. She was taken for emergent small bowel and left colon resection on 04/15/21 under Dr. Johney Maine. 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 Margins negative for invasive carcinoma, but low-grade dysplasia involves proximal margin. All 5 lymph nodes negative (0/5). MSI low. -repeat CT AP on 06/16/21 showed progressive metastatic disease involving soft tissue masses in left psoas muscle and left lateral abdominal wall and peritoneal masses, and new mild retroperitoneal lymphadenopathy.  -baseline CEA on 06/22/21 was elevated at 87.41. -she started FOLFOX on 06/22/21. She has tolerated well so far with mild n/v after pump d/c and cold sensitivity. -biopsy of the LUQ peritoneal mass on 07/10/21 confirmed metastatic adenocarcinoma with necrosis, consistent with colon primary. -Mikayla Rios appears stable. S/p cycle 3 FOLFOX, she is tolerating well with mild n/v after pump d/c and cold sensitivity. She is able to recover and function at home with adequate PS.  -CBC and CMP are stable. K 3.2, she will increase K to TID for 1 week then back to BID.  -CEA is rising slightly, but overall she is clinically doing well and pain has  improved on chemo. I recommend to watch closely for now.  -I spoke to genetics counselor and Dr. Burr Medico about FO report and genetic testing. FO shows MSI-H disease, BRCA, [name]6, and APC mutations among others which are not targetable. 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 Due to BRCA mutation on FO, she may be eligible for PARP inhibitor down the line but will hold for now. -She will proceed with cycle 4 FOLFOX today as planned, at full dose. Will scan after cycle 5 or 6. -F/up in 2 weeks with cycle 5   2. Symptom Management: Abdominal pain, Weight loss -she continues to have severe pain at the incision site. She saw Dr. Johney Maine on 06/11/21, who said the pain could last up to a year due to the complexity of her surgery. -her pain is now being managed by palliative care NP Lexine Baton currently on MS contin 15 mg BID and percocet 1-2 PRN, she requires tabs q3-4 hours. -f/up Palliative medicine today   3. Anxiety and depression -Due to the prolonged illness and multiple hospital stay since May 2022, and cancer diagnosis, she has been feeling depressed lately. -cymbalta prescribed 05/25/21 -She had panic attack 2/13 after last chemo, when she was reacting to clorhexidine. Tearful today talking about the episode. -F/up Dr. Michail Sermon   4. 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 However her MSI was low, instead of high, which is unusual for Lynch syndrome. -FO showed MSI-High disease, and showed multiple other mutations including APC, [name]6, and BRCA mutations.  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 The full report has not been  disclosed to Mikayla Rios yet and is not available to me at this time -I indicated her children should seek testing, when appropriate. Her 30 yo son is eligible now, and possibly her 64 yo daughter who is mature. 34 yo child may be too young  -Her spouse is interested in testing, I let him know to speak with Faith Rogue when she calls to discuss pt's results.   5. Social Support -she is connected with Education officer, museum -she is working to apply for disability. Her job ended just prior to the start of her symptoms. -she has three children-- ages 34, 37, and 60.   PLAN: -labs, FO reviewed -proceed with cycle 4 FOLFOX today as planned -pain management per palliative NP Lexine Baton, f/up today -f/up genetics for full results and recommendations -increase oral K to 20 meq TID x1 week, then back to BID -F/up in 2 weeks with cycle 5 -restage in 3-5 weeks, after cycle 5 or 6 depending on next CEA   Orders Placed This Encounter  Procedures   CT CHEST ABDOMEN PELVIS W CONTRAST    Standing Status:   Future    Standing Expiration Date:   08/03/2022    Order Specific Question:   Is patient pregnant?    Answer:   No    Order Specific Question:   Preferred imaging location?    Answer:   [facility]    Order Specific Question:   Is Oral Contrast requested for this exam?    Answer:   Yes, Per Radiology protocol   Pregnancy, urine    Standing Status:   Standing    Number of Occurrences:   20    Standing Expiration Date:   08/03/2022   All questions were answered. The patient knows to call the clinic with any problems, questions or concerns. No barriers to learning was detected. I spent 20 minutes counseling the patient face to face. The total time spent in the appointment was 30 minutes and more than 50% was on counseling and review of test results     Alla Feeling, NP 08/03/21

## 2021-08-03 ENCOUNTER — Inpatient Hospital Stay: Payer: BC Managed Care – PPO

## 2021-08-03 ENCOUNTER — Inpatient Hospital Stay (HOSPITAL_BASED_OUTPATIENT_CLINIC_OR_DEPARTMENT_OTHER): Payer: BC Managed Care – PPO | Admitting: Nurse Practitioner

## 2021-08-03 ENCOUNTER — Encounter: Payer: Self-pay | Admitting: Nurse Practitioner

## 2021-08-03 ENCOUNTER — Other Ambulatory Visit: Payer: Self-pay

## 2021-08-03 VITALS — BP 125/88 | HR 116

## 2021-08-03 VITALS — BP 134/101 | HR 105 | Temp 98.2°F | Resp 18 | Wt 182.5 lb

## 2021-08-03 DIAGNOSIS — Z515 Encounter for palliative care: Secondary | ICD-10-CM

## 2021-08-03 DIAGNOSIS — Z5111 Encounter for antineoplastic chemotherapy: Secondary | ICD-10-CM | POA: Diagnosis not present

## 2021-08-03 DIAGNOSIS — E669 Obesity, unspecified: Secondary | ICD-10-CM | POA: Diagnosis not present

## 2021-08-03 DIAGNOSIS — G893 Neoplasm related pain (acute) (chronic): Secondary | ICD-10-CM

## 2021-08-03 DIAGNOSIS — Z803 Family history of malignant neoplasm of breast: Secondary | ICD-10-CM | POA: Diagnosis not present

## 2021-08-03 DIAGNOSIS — C187 Malignant neoplasm of sigmoid colon: Secondary | ICD-10-CM

## 2021-08-03 DIAGNOSIS — D649 Anemia, unspecified: Secondary | ICD-10-CM | POA: Diagnosis not present

## 2021-08-03 DIAGNOSIS — F419 Anxiety disorder, unspecified: Secondary | ICD-10-CM | POA: Diagnosis not present

## 2021-08-03 DIAGNOSIS — Z452 Encounter for adjustment and management of vascular access device: Secondary | ICD-10-CM

## 2021-08-03 DIAGNOSIS — Z800 Family history of malignant neoplasm of digestive organs: Secondary | ICD-10-CM | POA: Diagnosis not present

## 2021-08-03 DIAGNOSIS — R6510 Systemic inflammatory response syndrome (SIRS) of non-infectious origin without acute organ dysfunction: Secondary | ICD-10-CM | POA: Diagnosis not present

## 2021-08-03 DIAGNOSIS — I10 Essential (primary) hypertension: Secondary | ICD-10-CM | POA: Diagnosis not present

## 2021-08-03 DIAGNOSIS — Z79899 Other long term (current) drug therapy: Secondary | ICD-10-CM | POA: Diagnosis not present

## 2021-08-03 LAB — CMP (CANCER CENTER ONLY)
ALT: 5 U/L (ref 0–44)
AST: 11 U/L — ABNORMAL LOW (ref 15–41)
Albumin: 3.6 g/dL (ref 3.5–5.0)
Alkaline Phosphatase: 52 U/L (ref 38–126)
Anion gap: 9 (ref 5–15)
BUN: 16 mg/dL (ref 6–20)
CO2: 34 mmol/L — ABNORMAL HIGH (ref 22–32)
Calcium: 9.3 mg/dL (ref 8.9–10.3)
Chloride: 99 mmol/L (ref 98–111)
Creatinine: 0.52 mg/dL (ref 0.44–1.00)
GFR, Estimated: >60 mL/min (ref >60)
Glucose, Bld: 97 mg/dL (ref 70–99)
Potassium: 3.2 mmol/L — ABNORMAL LOW (ref 3.5–5.1)
Sodium: 142 mmol/L (ref 135–145)
Total Bilirubin: 0.4 mg/dL (ref 0.3–1.2)
Total Protein: 7.3 g/dL (ref 6.5–8.1)

## 2021-08-03 LAB — CBC WITH DIFFERENTIAL (CANCER CENTER ONLY)
Abs Immature Granulocytes: 0.04 10*3/uL (ref 0.00–0.07)
Basophils Absolute: 0 10*3/uL (ref 0.0–0.1)
Basophils Relative: 0 %
Eosinophils Absolute: 0.1 10*3/uL (ref 0.0–0.5)
Eosinophils Relative: 1 %
HCT: 32.8 % — ABNORMAL LOW (ref 36.0–46.0)
Hemoglobin: 10.3 g/dL — ABNORMAL LOW (ref 12.0–15.0)
Immature Granulocytes: 1 %
Lymphocytes Relative: 19 %
Lymphs Abs: 1.6 10*3/uL (ref 0.7–4.0)
MCH: 26.7 pg (ref 26.0–34.0)
MCHC: 31.4 g/dL (ref 30.0–36.0)
MCV: 85 fL (ref 80.0–100.0)
Monocytes Absolute: 0.6 10*3/uL (ref 0.1–1.0)
Monocytes Relative: 8 %
Neutro Abs: 5.7 10*3/uL (ref 1.7–7.7)
Neutrophils Relative %: 71 %
Platelet Count: 456 10*3/uL — ABNORMAL HIGH (ref 150–400)
RBC: 3.86 MIL/uL — ABNORMAL LOW (ref 3.87–5.11)
RDW: 16.3 % — ABNORMAL HIGH (ref 11.5–15.5)
WBC Count: 8 10*3/uL (ref 4.0–10.5)
nRBC: 0 % (ref 0.0–0.2)

## 2021-08-03 LAB — CEA (IN HOUSE-CHCC): CEA (CHCC-In House): 113.33 ng/mL — ABNORMAL HIGH (ref 0.00–5.00)

## 2021-08-03 MED ORDER — PALONOSETRON HCL INJECTION 0.25 MG/5ML
0.2500 mg | Freq: Once | INTRAVENOUS | Status: AC
  Administered 2021-08-03: 0.25 mg via INTRAVENOUS
  Filled 2021-08-03: qty 5

## 2021-08-03 MED ORDER — ALTEPLASE 2 MG IJ SOLR
2.0000 mg | Freq: Once | INTRAMUSCULAR | Status: AC | PRN
  Administered 2021-08-03: 2 mg
  Filled 2021-08-03: qty 2

## 2021-08-03 MED ORDER — LEUCOVORIN CALCIUM INJECTION 350 MG
400.0000 mg/m2 | Freq: Once | INTRAVENOUS | Status: AC
  Administered 2021-08-03: 788 mg via INTRAVENOUS
  Filled 2021-08-03: qty 39.4

## 2021-08-03 MED ORDER — MORPHINE SULFATE ER 30 MG PO TBCR: 30.0000 mg | EXTENDED_RELEASE_TABLET | Freq: Three times a day (TID) | ORAL | 0 refills | Status: DC

## 2021-08-03 MED ORDER — DEXTROSE 5 % IV SOLN: Freq: Once | INTRAVENOUS | Status: AC

## 2021-08-03 MED ORDER — UAC/UVC NICU FLUSH (1/4 NS + HEPARIN 0.5 UNIT/ML): 0.5000 mL | INJECTION | INTRAVENOUS | Status: DC | PRN

## 2021-08-03 MED ORDER — FLUOROURACIL CHEMO INJECTION 2.5 GM/50ML
400.0000 mg/m2 | Freq: Once | INTRAVENOUS | Status: AC
  Administered 2021-08-03: 800 mg via INTRAVENOUS
  Filled 2021-08-03: qty 16

## 2021-08-03 MED ORDER — OXALIPLATIN CHEMO INJECTION 100 MG/20ML
85.0000 mg/m2 | Freq: Once | INTRAVENOUS | Status: AC
  Administered 2021-08-03: 165 mg via INTRAVENOUS
  Filled 2021-08-03: qty 33

## 2021-08-03 MED ORDER — SODIUM CHLORIDE 0.9% FLUSH
10.0000 mL | INTRAVENOUS | Status: DC | PRN
  Administered 2021-08-03: 10 mL via INTRAVENOUS

## 2021-08-03 MED ORDER — SODIUM CHLORIDE 0.9 % IV SOLN
2400.0000 mg/m2 | INTRAVENOUS | Status: DC
  Administered 2021-08-03: 4750 mg via INTRAVENOUS
  Filled 2021-08-03: qty 95

## 2021-08-03 MED ORDER — SODIUM CHLORIDE 0.9 % IV SOLN
10.0000 mg | Freq: Once | INTRAVENOUS | Status: AC
  Administered 2021-08-03: 10 mg via INTRAVENOUS
  Filled 2021-08-03: qty 10

## 2021-08-03 NOTE — Progress Notes (Signed)
Pt asked to provide urine sample prior to chemotherapy. Pt states she is not pregnant and cannot provide sample at this time. Per Regan Rakers NP ok to proceed without urine sample if pt cannot provide.

## 2021-08-03 NOTE — Progress Notes (Signed)
Nutrition Follow-up:  Patient with colorectal cancer s/p colostomy.  Patient receiving folfox.    Met with patient during infusion.  Patient reports that nausea and smells making her nauseated have kept her from eating well this time.  Does not always take nausea medication.  Says that she is drinking ensure complete shakes mostly when does not eat well.  Says that she has also been having gas and has been trying to keep a food diary to determine trigger foods.      Medications: reviewed  Labs: K 3.2  Anthropometrics:   Weight 182 lb 8 oz today  195 lb on 1/30 193 lb 3 oz on 12/19  UBW of 300 lb May 2022   NUTRITION DIAGNOSIS: Unintentional weight loss    MALNUTRITION DIAGNOSIS: Unintentional weight loss continues   INTERVENTION:  Encouraged patient to take nausea medication.   Encouraged drinking ensure complete at least 1 daily if at all possible for added calories and protein Continue food diary.  Discussed foods most likely to cause gas.      MONITORING, EVALUATION, GOAL: weight trends, intake   NEXT VISIT: Monday, March 27 during infusion  Ishia Tenorio B. Zenia Resides, [place], [place] Registered Dietitian [phone] (mobile)

## 2021-08-03 NOTE — Progress Notes (Signed)
Per Lacie NP, ok to treat with HR 116.

## 2021-08-03 NOTE — Progress Notes (Signed)
[facility]  [phone] [phone]   Name: RONNETTE RUMP Date: 08/03/2021 MRN: [id number]  DOB: 18-Apr-1986  Patient Care Team: [name], MD as PCP - General (Internal Medicine) [name], MD as PCP - Cardiology (Cardiology) [name], [name] as Physician Assistant (Emergency Medicine) [name], MD as Consulting Physician (Oncology) [name], RN as Oncology Nurse Navigator (Oncology) [name], MD as Consulting Physician (General Surgery) [name], [name], MD as Consulting Physician (Neurology) [name], [name], MD as Consulting Physician (Obstetrics and Gynecology) [name], MD as Consulting Physician (Gastroenterology) [name], [name], NP as Nurse Practitioner (Nurse Practitioner)    INTERVAL HISTORY: TANEKIA RYANS is a 36 y.o. 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 Recent CT scan on 06/16/21 showed progressive metastatic disease. Palliative ask to see for symptom management.    SOCIAL HISTORY:     reports that she has never smoked. She has never used smokeless tobacco. She reports that she does not currently use alcohol. She reports that she does not use drugs.  ADVANCE DIRECTIVES:    CODE STATUS:   PAST MEDICAL HISTORY: Past Medical History:  Diagnosis Date   Allergy    seasonal   Anemia    Blood infection 1985   Blood transfusion without reported diagnosis    Diverticulitis    Family history of breast cancer    Family history of colon cancer    Family history of stomach cancer    Hypertension    Obesity    Sleep apnea     ALLERGIES:  is allergic to chlorhexidine gluconate, shellfish allergy, and penicillins.  MEDICATIONS:  Current Outpatient Medications  Medication Sig Dispense Refill   amLODipine (NORVASC) 10 MG  tablet Take 10 mg by mouth daily.     dexamethasone (DECADRON) 4 MG tablet Take 1 tablet (4 mg total) by mouth daily. Take for 3-5 day after chemo 20 tablet 0   diclofenac Sodium (VOLTAREN) 1 % GEL Apply 4 g topically 4 (four) times daily as needed (for mild pain). 4 g 0   DULoxetine (CYMBALTA) 30 MG capsule Take 1 capsule (30 mg total) by mouth daily. 60 capsule 1   gabapentin (NEURONTIN) 300 MG capsule Take 1 capsule (300 mg) by mouth twice daily and Take 2 capsules (600mg ) by mouth at bedtime. 120 capsule 0   heparin lock flush 100 UNIT/ML SOLN injection Use 1 syringe (into each lumen) twice daily 600 mL 0   KLOR-CON M20 20 MEQ tablet TAKE 1 TABLET (20 MEQ TOTAL) BY MOUTH DAILY. TAKE 1 TAB TWICE DAILY FOR 3 DAYS THEN ONCE DAILY 30 tablet 1   metoprolol tartrate (LOPRESSOR) 50 MG tablet Take 1 tablet (50 mg total) by mouth 2 (two) times daily. 180 tablet 2   morphine (MS CONTIN) 30 MG 12 hr tablet Take 1 tablet (30 mg total) by mouth every 8 (eight) hours. 60 tablet 0   ondansetron (ZOFRAN) 8 MG tablet Take 1 tablet (8 mg total) by mouth 2 (two) times daily as needed for refractory nausea / vomiting. Start on day 3 after chemotherapy. 30 tablet 1   oxyCODONE (OXY IR/ROXICODONE) 5 MG immediate release tablet Take 1-2 tablets (5-10 mg total) by mouth every 6 (six) hours as needed for severe pain, moderate pain or breakthrough pain. 90 tablet 0   prochlorperazine (COMPAZINE) 10 MG tablet Take 1  tablet (10 mg total) by mouth every 6 (six) hours as needed (Nausea or vomiting). 30 tablet 1   Sodium Chloride Flush (NORMAL SALINE FLUSH) 0.9 % SOLN Inject 10 mLs (1 syringe) into lumen twice daily as directed 1200 mL 2   No current facility-administered medications for this visit.   Facility-Administered Medications Ordered in Other Visits  Medication Dose Route Frequency Provider Last Rate Last Admin   fluorouracil (ADRUCIL) 4,750 mg in sodium chloride 0.9 % 55 mL chemo infusion  2,400 mg/m2 (Treatment  Plan Recorded) Intravenous 1 day or 1 dose [name], MD       fluorouracil (ADRUCIL) chemo injection 800 mg  400 mg/m2 (Treatment Plan Recorded) Intravenous Once [name], MD        VITAL SIGNS: There were no vitals taken for this visit. There were no vitals filed for this visit.   Estimated body mass index is 32.33 kg/m as calculated from the following:   Height as of 07/20/21: 5\' 3"  (1.6 m).   Weight as of an earlier encounter on 08/03/21: 182 lb 8 oz (82.8 kg).   PERFORMANCE STATUS (ECOG) : 1 - Symptomatic but completely ambulatory   Physical Exam General: NAD Cardiovascular:RRR Abdomen: soft, tender, + bowel sounds, colostomy in place Neurological: AAO x4  IMPRESSION:  I saw Mrs. Trembley during her infusion today. Resting comfortably in recliner. No distress noted. Is tolerating treatments. States she is feeling better than she was previously  however pain continues to be a challenge despite current regimen.  Neoplams related pain Geetika reports her pain is somewhat improved. Her pain seems to improve once regimen is changed, however after several days it begins to escalate again. She is tolerating her MS Contin 30 mg twice daily however is having to use her breakthrough medications around the clock. States pain is manageable however returns in about 3-4 hours. We discussed adjustments to long-acting with a goal of not having to utilize breakthrough as often. No changes to be made to her Oxy IR at this time. We will continue to closely monitor.   Denies constipation or nausea not associated around treatment times.   Anxiety  Continues to focus on her health and life one day at at time. Focusing on the positives, time with family, and remaining hopeful. She has a strong spiritual foundation. She is being followed by Dr. Nelida Meuse which she is appreciative of.   I discussed the importance of continued conversation with family and their medical providers regarding overall plan of care  and treatment options, ensuring decisions are within the context of the patients values and GOCs.  PLAN: Oxy IR 5-10 mg every 6 hours as needed for breakthrough pain Increase MS Contin 30 mg to every 8 hours (previous dose every 12 hours). Given total use of percocet with consideration of cross tolerance average dose every 8 hours daily 42.5mg .  Gabapentin 300 mg BID and 600mg  QHS Cymbalta 30 mg daily I will plan to see her back in 2-3 weeks in collaboration with future appointments.    Patient expressed understanding and was in agreement with this plan. She also understands that She can call the clinic at any time with any questions, concerns, or complaints.   Time Total: 35 min.   Visit consisted of counseling and education dealing with the complex and emotionally intense issues of symptom management and palliative care in the setting of serious and potentially life-threatening illness.Greater than 50%  of this time was spent counseling and coordinating care related  to the above assessment and plan.  Alda Lea, AGPCNP-BC  Palliative Medicine Team/[place]

## 2021-08-03 NOTE — Patient Instructions (Signed)
[facility]  Discharge Instructions: ?Thank you for choosing [facility] to provide your oncology and hematology care.  ? ?If you have a lab appointment with the Cancer Center, please go directly to the Cancer Center and check in at the registration area. ?  ?Wear comfortable clothing and clothing appropriate for easy access to any Portacath or PICC line.  ? ?We strive to give you quality time with your provider. You may need to reschedule your appointment if you arrive late (15 or more minutes).  Arriving late affects you and other patients whose appointments are after yours.  Also, if you miss three or more appointments without notifying the office, you may be dismissed from the clinic at the provider?s discretion.    ?  ?For prescription refill requests, have your pharmacy contact our office and allow 72 hours for refills to be completed.   ? ?Today you received the following chemotherapy and/or immunotherapy agents: Oxaliplatin, Leucovorin, Fluorouracil.     ?  ?To help prevent nausea and vomiting after your treatment, we encourage you to take your nausea medication as directed. ? ?BELOW ARE SYMPTOMS THAT SHOULD BE REPORTED IMMEDIATELY: ?*FEVER GREATER THAN 100.4 F (38 ?C) OR HIGHER ?*CHILLS OR SWEATING ?*NAUSEA AND VOMITING THAT IS NOT CONTROLLED WITH YOUR NAUSEA MEDICATION ?*UNUSUAL SHORTNESS OF BREATH ?*UNUSUAL BRUISING OR BLEEDING ?*URINARY PROBLEMS (pain or burning when urinating, or frequent urination) ?*BOWEL PROBLEMS (unusual diarrhea, constipation, pain near the anus) ?TENDERNESS IN MOUTH AND THROAT WITH OR WITHOUT PRESENCE OF ULCERS (sore throat, sores in mouth, or a toothache) ?UNUSUAL RASH, SWELLING OR PAIN  ?UNUSUAL VAGINAL DISCHARGE OR ITCHING  ? ?Items with * indicate a potential emergency and should be followed up as soon as possible or go to the Emergency Department if any problems should occur. ? ?Please show the CHEMOTHERAPY ALERT CARD or  IMMUNOTHERAPY ALERT CARD at check-in to the Emergency Department and triage nurse. ? ?Should you have questions after your visit or need to cancel or reschedule your appointment, please contact [facility]  Dept: [phone]  and follow the prompts.  Office hours are 8:00 a.m. to 4:30 p.m. Monday - Friday. Please note that voicemails left after 4:00 p.m. may not be returned until the following business day.  We are closed weekends and major holidays. You have access to a nurse at all times for urgent questions. Please call the main number to the clinic Dept: [phone] and follow the prompts. ? ? ?For any non-urgent questions, you may also contact your provider using MyChart. We now offer e-Visits for anyone 18 and older to request care online for non-urgent symptoms. For details visit [email or web address]. ?  ?Also download the MyChart app! Go to the app store, search "MyChart", open the app, select [facility], and log in with your MyChart username and password. ? ?Due to Covid, a mask is required upon entering the hospital/clinic. If you do not have a mask, one will be given to you upon arrival. For doctor visits, patients may have 1 support person aged 18 or older with them. For treatment visits, patients cannot have anyone with them due to current Covid guidelines and our immunocompromised population.  ? ?

## 2021-08-04 ENCOUNTER — Telehealth: Payer: Self-pay | Admitting: Licensed Clinical Social Worker

## 2021-08-04 ENCOUNTER — Telehealth: Payer: Self-pay | Admitting: Nurse Practitioner

## 2021-08-04 ENCOUNTER — Encounter: Payer: Self-pay | Admitting: Licensed Clinical Social Worker

## 2021-08-04 ENCOUNTER — Telehealth: Payer: Self-pay | Admitting: Hematology

## 2021-08-04 DIAGNOSIS — Z1379 Encounter for other screening for genetic and chromosomal anomalies: Secondary | ICD-10-CM | POA: Insufficient documentation

## 2021-08-04 DIAGNOSIS — D126 Benign neoplasm of colon, unspecified: Secondary | ICD-10-CM | POA: Insufficient documentation

## 2021-08-04 NOTE — Telephone Encounter (Signed)
Attempted to contact Mikayla Rios again to review genetic test results, result was disclosed to [name] yesterday by her providers. Not able to leave a voicemail due to mailbox being full.

## 2021-08-04 NOTE — Telephone Encounter (Signed)
Scheduled follow-up appointments per 2/27 los. Patient is aware. °

## 2021-08-04 NOTE — Telephone Encounter (Signed)
Scheduled per 2/28 secure chat, pt voicemail is full, calender has been mailed

## 2021-08-05 ENCOUNTER — Other Ambulatory Visit: Payer: Self-pay

## 2021-08-05 ENCOUNTER — Inpatient Hospital Stay: Payer: BC Managed Care – PPO | Attending: Hematology

## 2021-08-05 DIAGNOSIS — I10 Essential (primary) hypertension: Secondary | ICD-10-CM | POA: Diagnosis not present

## 2021-08-05 DIAGNOSIS — G893 Neoplasm related pain (acute) (chronic): Secondary | ICD-10-CM | POA: Insufficient documentation

## 2021-08-05 DIAGNOSIS — Z5189 Encounter for other specified aftercare: Secondary | ICD-10-CM | POA: Diagnosis not present

## 2021-08-05 DIAGNOSIS — Z79899 Other long term (current) drug therapy: Secondary | ICD-10-CM | POA: Insufficient documentation

## 2021-08-05 DIAGNOSIS — K5900 Constipation, unspecified: Secondary | ICD-10-CM | POA: Diagnosis not present

## 2021-08-05 DIAGNOSIS — R110 Nausea: Secondary | ICD-10-CM | POA: Diagnosis not present

## 2021-08-05 DIAGNOSIS — Z800 Family history of malignant neoplasm of digestive organs: Secondary | ICD-10-CM | POA: Insufficient documentation

## 2021-08-05 DIAGNOSIS — C187 Malignant neoplasm of sigmoid colon: Secondary | ICD-10-CM | POA: Insufficient documentation

## 2021-08-05 DIAGNOSIS — F418 Other specified anxiety disorders: Secondary | ICD-10-CM | POA: Diagnosis not present

## 2021-08-05 DIAGNOSIS — Z5111 Encounter for antineoplastic chemotherapy: Secondary | ICD-10-CM | POA: Diagnosis not present

## 2021-08-05 DIAGNOSIS — Z803 Family history of malignant neoplasm of breast: Secondary | ICD-10-CM | POA: Diagnosis not present

## 2021-08-05 MED ORDER — HEPARIN SOD (PORK) LOCK FLUSH 100 UNIT/ML IV SOLN
500.0000 [IU] | Freq: Once | INTRAVENOUS | Status: AC | PRN
  Administered 2021-08-05: 500 [IU]

## 2021-08-05 MED ORDER — SODIUM CHLORIDE 0.9% FLUSH
10.0000 mL | INTRAVENOUS | Status: DC | PRN
  Administered 2021-08-05: 10 mL

## 2021-08-05 NOTE — Progress Notes (Signed)
Dc'd pump, flushed both lumens with no problem. No blood return in both. Patient is aware that she may have to get cathflo on next visit  ?

## 2021-08-07 ENCOUNTER — Other Ambulatory Visit: Payer: Self-pay | Admitting: Nurse Practitioner

## 2021-08-07 MED ORDER — OXYCODONE HCL 5 MG PO TABS: 5.0000 mg | ORAL_TABLET | Freq: Four times a day (QID) | ORAL | 0 refills | Status: DC | PRN

## 2021-08-11 DIAGNOSIS — Z791 Long term (current) use of non-steroidal anti-inflammatories (NSAID): Secondary | ICD-10-CM | POA: Diagnosis not present

## 2021-08-11 DIAGNOSIS — D62 Acute posthemorrhagic anemia: Secondary | ICD-10-CM | POA: Diagnosis not present

## 2021-08-11 DIAGNOSIS — I10 Essential (primary) hypertension: Secondary | ICD-10-CM | POA: Diagnosis not present

## 2021-08-11 DIAGNOSIS — K5792 Diverticulitis of intestine, part unspecified, without perforation or abscess without bleeding: Secondary | ICD-10-CM | POA: Diagnosis not present

## 2021-08-11 DIAGNOSIS — E876 Hypokalemia: Secondary | ICD-10-CM | POA: Diagnosis not present

## 2021-08-11 DIAGNOSIS — Z79891 Long term (current) use of opiate analgesic: Secondary | ICD-10-CM | POA: Diagnosis not present

## 2021-08-11 DIAGNOSIS — C189 Malignant neoplasm of colon, unspecified: Secondary | ICD-10-CM | POA: Diagnosis not present

## 2021-08-11 DIAGNOSIS — E669 Obesity, unspecified: Secondary | ICD-10-CM | POA: Diagnosis not present

## 2021-08-11 DIAGNOSIS — Z483 Aftercare following surgery for neoplasm: Secondary | ICD-10-CM | POA: Diagnosis not present

## 2021-08-11 DIAGNOSIS — G4730 Sleep apnea, unspecified: Secondary | ICD-10-CM | POA: Diagnosis not present

## 2021-08-11 DIAGNOSIS — Z452 Encounter for adjustment and management of vascular access device: Secondary | ICD-10-CM | POA: Diagnosis not present

## 2021-08-11 DIAGNOSIS — Z6837 Body mass index (BMI) 37.0-37.9, adult: Secondary | ICD-10-CM | POA: Diagnosis not present

## 2021-08-11 DIAGNOSIS — Z9049 Acquired absence of other specified parts of digestive tract: Secondary | ICD-10-CM | POA: Diagnosis not present

## 2021-08-11 DIAGNOSIS — Z433 Encounter for attention to colostomy: Secondary | ICD-10-CM | POA: Diagnosis not present

## 2021-08-11 DIAGNOSIS — Z4801 Encounter for change or removal of surgical wound dressing: Secondary | ICD-10-CM | POA: Diagnosis not present

## 2021-08-11 DIAGNOSIS — E440 Moderate protein-calorie malnutrition: Secondary | ICD-10-CM | POA: Diagnosis not present

## 2021-08-12 ENCOUNTER — Encounter: Payer: BC Managed Care – PPO | Attending: Registered Nurse | Admitting: Registered Nurse

## 2021-08-12 ENCOUNTER — Encounter: Payer: Self-pay | Admitting: Registered Nurse

## 2021-08-12 ENCOUNTER — Other Ambulatory Visit: Payer: Self-pay

## 2021-08-12 VITALS — BP 120/87 | HR 100 | Ht 63.0 in

## 2021-08-12 DIAGNOSIS — C187 Malignant neoplasm of sigmoid colon: Secondary | ICD-10-CM | POA: Diagnosis not present

## 2021-08-12 DIAGNOSIS — R5381 Other malaise: Secondary | ICD-10-CM | POA: Insufficient documentation

## 2021-08-12 DIAGNOSIS — R9431 Abnormal electrocardiogram [ECG] [EKG]: Secondary | ICD-10-CM | POA: Insufficient documentation

## 2021-08-12 DIAGNOSIS — I10 Essential (primary) hypertension: Secondary | ICD-10-CM | POA: Diagnosis not present

## 2021-08-12 NOTE — Progress Notes (Signed)
Subjective:    Patient ID: [facility], female    DOB: 13-Feb-1986, 36 y.o.   MRN: [id number]  HPI: [name] is a 36 y.o. female who is here for [facility] appointment for F/U of her  Debility, Malignant Neoplasm of Sigmoid Colon and Essential Hypertension. She presented to [facility]  on 04/10/2021 with progressive generalized weakness and Tachycardia .  Dr. Cyd Silence H&P Note 04/10/2021  HPI:  36 year old female with past medical history of hypertension, inappropriate sinus tachycardia, obesity, obstructive sleep apnea and diverticulosis with complicated diverticulitis and abscess formation 11/2020 status post IR guided placement of 2 left lower quadrant drains who presents to [facility] with complaints of generalized weakness and lightheadedness.   Of note, patient was hospitalized at [facility] from 5/31 until 6/6 for abdominal pain with initial work-up showing perforated diverticulitis.  Patient was initially admitted to the surgical service and treated with intravenous antibiotics.  Due to clinical worsening and development of multiple diverticular abscesses I was consulted and to left lower quadrant drains were successfully placed with patient eventually being discharged on 6/6.  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  Patient underwent a posterior drain exchange on 7/8 followed by an anterior drain exchange on 8/23.   Patient continues to have the 2 drains in her left lower quadrant and over the course of the past several months is developed progressively worsening generalized weakness.  This weakness has become severe in intensity, is worse with any exertion whatsoever and is associated with bouts of lightheadedness.  This is also been associated with poor oral intake and gradual weight loss.  Patient has recently been seen by gastroenterology on 10/6 with eventual plan to  undergo endoscopic work-up to rule out malignancy.  Patient reports that approximately 1 week ago with poor appetite patient explains that last Wednesday she had an episode of severe weakness and fell when trying to get into her vehicle.  Patient reports having a similar episode earlier in the day on 11/4 associated with light headedness.   Due to patient's progressively worsening weakness and lightheadedness patient eventually presented to [facility] department for evaluation.   Upon evaluation in the emergency department patient underwent CT imaging of the chest abdomen and pelvis.  CT angiogram of the chest revealed no evidence of pulmonary embolism however CT imaging of the abdomen and pelvis did reveal new enhancing fluid collection posterior to a segment of inflamed colon measuring 8.5 x 3.5 x 10.0 cm invading the adjacent iliopsoas muscle as well as extending through the left lateral abdominal wall.  Posterior drain is noted to be in the middle of this abscess.  The anterior drain appears to be completely dislodged into the subcutaneous tissues.  This is a dramatic change compared to the patient's recent CT imaging of the abdomen and pelvis on 10/28.  Patient was also found to have multiple SIRS criteria with concerns for early sepsis and therefore patient was given a 1 L bolus of lactated Ringer solution.  Patient is also found to be hypokalemic at 2.7 and was given 30 millequivalents of intravenous potassium chloride.  ER provider discussed case with Dr. Harlow [facility] with general surgery who recommended medicine admission, antibiotics and stated that he would evaluate the patient in consultation the morning of 11/5.  The hospitalist group is now been called to assess the patient for admission to the [facility].  General Surgery, Gastro enterology, Intervention Radiology and Oncology Consulted.   CT Angio: Chest: CT Abdomen Pelvis IMPRESSION: 1. Again seen are findings compatible with  descending colon diverticulitis with perforation. Free air and inflammation have increased in the interval. 2. New lobulated enhancing fluid collection posterior to this segment of inflamed colon measuring 8.5 x 3.5 x 10.0 cm. This collection now invades the adjacent iliopsoas muscle as well as extends through the left lateral abdominal wall. The tip of the drainage catheter is in this collection. Findings are compatible with abscess. 3. Anterior left percutaneous drainage catheter tip has been pulled back and is now within the subcutaneous tissues. 4. Trace free fluid. 5. No pulmonary embolism.  No acute cardiopulmonary process. 6. Cholelithiasis. 7. Fatty infiltration of the liver.   She underwent on 04/15/2021: By Dr [facility]       Procedure Laterality Anesthesia  LAPAROSCOPIC ASSISTED HARTMANN RESECTION N/A General  COLOSTOMY CREATION/HARTMANN PROCEDURE        Ms. Gaertner was admitted to inpatient rehabilitation on 05/01/2021 and discharged home on 05/15/2021. She received Home Health Therapy with [facility]. She sates she has pain in her abdomen. She rates her pain 4. She arrived to office in wheelchair. She states she walks with walker in the home.   Husband in room.    Pain Inventory Average Pain 5 Pain Right Now 4 My pain is intermittent, sharp, and aching  In the last 24 hours, has pain interfered with the following? General activity 5 Relation with others 5 Enjoyment of life 5 What TIME of day is your pain at its worst? night Sleep (in general) Poor  Pain is worse with: walking Pain improves with: rest, therapy/exercise, and medication Relief from Meds: 7  use a walker how many minutes can you walk? 5 ability to climb steps?  yes do you drive?  yes Do you have any goals in this area?  yes  not employed: date last employed 10/06/20  weakness numbness tremor tingling trouble walking dizziness confusion depression anxiety  CT/MRI  Oncologist,  Surgeon    Family History  Problem Relation Age of Onset   Cancer Mother        breast, and in remission, dx 67s   Diabetes Mother    Hypertension Mother    Colon cancer Mother        dx at age 69   Congestive Heart Failure Father    Hypertension Father    Stomach cancer Maternal Uncle    Heart disease Paternal Aunt    Cancer Paternal Uncle        colon   Diabetes Maternal Grandmother    Hypertension Maternal Grandmother    Diabetes Maternal Grandfather    Kidney disease Maternal Grandfather    Diabetes Paternal Grandmother    Cancer Paternal Grandfather        colon   Autism Son    Colon polyps Half-Sister        colectomy   Social History   Socioeconomic History   Marital status: Married    Spouse name: Roderick Pee   Number of children: 3   Years of education: Not on file   Highest education level: Not on file  Occupational History   Not on file  Tobacco Use   Smoking status: Never   Smokeless tobacco: Never  Vaping Use   Vaping Use: Never used  Substance and Sexual Activity   Alcohol use: Not Currently   Drug use: No   Sexual activity:  Yes    Partners: Male    Birth control/protection: None  Other Topics Concern   Not on file  Social History Narrative   Not on file   Social Determinants of Health   Financial Resource Strain: High Risk   Difficulty of Paying Living Expenses: Hard  Food Insecurity: Food Insecurity Present   Worried About Running Out of Food in the Last Year: Sometimes true   Ran Out of Food in the Last Year: Sometimes true  Transportation Needs: No Transportation Needs   Lack of Transportation (Medical): No   Lack of Transportation (Non-Medical): No  Physical Activity: Not on file  Stress: Stress Concern Present   Feeling of Stress : Very much  Social Connections: Moderately Integrated   Frequency of Communication with Friends and Family: More than three times a week   Frequency of Social Gatherings with Friends and Family: More than  three times a week   Attends Religious Services: More than 4 times per year   Active Member of Clubs or Organizations: No   Attends Archivist Meetings: Never   Marital Status: Married   Past Surgical History:  Procedure Laterality Date   COLECTOMY WITH COLOSTOMY CREATION/HARTMANN PROCEDURE N/A 04/15/2021   Procedure: COLOSTOMY CREATION/HARTMANN PROCEDURE;  Surgeon: [name], MD;  Location: [facility];  Service: General;  Laterality: N/A;   IR CATHETER TUBE CHANGE  12/12/2020   IR CATHETER TUBE CHANGE  01/27/2021   IR CATHETER TUBE CHANGE  02/19/2021   IR CATHETER TUBE CHANGE  04/13/2021   IR RADIOLOGIST EVAL & MGMT  11/27/2020   IR RADIOLOGIST EVAL & MGMT  12/11/2020   IR RADIOLOGIST EVAL & MGMT  01/07/2021   IR RADIOLOGIST EVAL & MGMT  02/04/2021   IR SINUS/FIST TUBE CHK-NON GI  12/12/2020   IR SINUS/FIST TUBE CHK-NON GI  02/19/2021   LAPAROSCOPIC PARTIAL COLECTOMY N/A 04/15/2021   Procedure: LAPAROSCOPIC ASSISTED HARTMANN RESECTION;  Surgeon: [name], MD;  Location: [facility];  Service: General;  Laterality: N/A;   Past Medical History:  Diagnosis Date   Allergy    seasonal   Anemia    Blood infection 1985   Blood transfusion without reported diagnosis    Diverticulitis    Family history of breast cancer    Family history of colon cancer    Family history of stomach cancer    Hypertension    Obesity    Sleep apnea    BP 120/87    Pulse 100    Ht '5\' 3"'$  (1.6 m)    SpO2 98%    BMI 32.33 kg/m   Opioid Risk Score:   Fall Risk Score:  `1  Depression screen PHQ 2/9  Depression screen [facility] 2/9 08/12/2021 07/14/2021 10/06/2020 08/14/2019 07/04/2019 11/20/2018 10/12/2018  Decreased Interest 3 2 0 0 0 0 0  Down, Depressed, Hopeless 1 1 0 0 0 0 0  PHQ - 2 Score 4 3 0 0 0 0 0  Altered sleeping 3 3 - - - - -  Tired, decreased energy 3 3 - - - - -  Change in appetite 3 1 - - - - -  Feeling bad or failure about yourself  1 0 - - - - -  Trouble concentrating 1 1 - - - - -   Moving slowly or fidgety/restless 1 1 - - - - -  Suicidal thoughts 0 0 - - - - -  PHQ-9 Score 16 12 - - - - -  Difficult doing work/chores Very difficult Somewhat difficult - - - - -  Some recent data might be hidden     Review of Systems  Constitutional:  Positive for unexpected weight change.  HENT: Negative.    Eyes: Negative.   Respiratory: Negative.    Cardiovascular: Negative.   Gastrointestinal:  Positive for abdominal pain.  Endocrine: Negative.   Genitourinary: Negative.   Musculoskeletal:  Positive for gait problem.  Skin: Negative.   Allergic/Immunologic: Negative.   Neurological:  Positive for dizziness, tremors and weakness.  Hematological: Negative.   Psychiatric/Behavioral:  Positive for confusion, dysphoric mood and sleep disturbance. The patient is nervous/anxious.       Objective:   Physical Exam Vitals and nursing note reviewed.  Constitutional:      Appearance: Normal appearance.  Cardiovascular:     Rate and Rhythm: Normal rate and regular rhythm.     Pulses: Normal pulses.     Heart sounds: Normal heart sounds.  Pulmonary:     Effort: Pulmonary effort is normal.     Breath sounds: Normal breath sounds.  Abdominal:     General: Bowel sounds are normal.     Palpations: Abdomen is soft.     Comments: Colostomy with Brown stool noted  Musculoskeletal:     Cervical back: Normal range of motion and neck supple.     Comments: Normal Muscle Bulk and Muscle Testing Reveals:  Upper Extremities: Full ROM and Muscle Strength 5/5  Lower Extremities: Full ROM and Muscle Strength 5/5 Arrived in wheelchair     Skin:    General: Skin is warm and dry.  Neurological:     Mental Status: She is alert and oriented to person, place, and time.  Psychiatric:        Mood and Affect: Mood normal.        Behavior: Behavior normal.         Assessment & Plan:  Debility: She was discharged with Home Health with [facility]. Continue HEP as tolerated.  Continue to monitor.  , Malignant Neoplasm of Sigmoid Colon: Oncology following. Receiving chemotherapy.  Essential Hypertension: Continue current medication regimen. PCP Following.  Tachycardia: Cardiology Following.  5. Chronic Pain Syndrome: Morphine and Oxycodone being prescribed by Palliative Care. PMP was Reviewed. Continue to Monitor.   F/U with Dr Dagoberto Ligas in 4-6 weeks

## 2021-08-13 DIAGNOSIS — Z85038 Personal history of other malignant neoplasm of large intestine: Secondary | ICD-10-CM | POA: Diagnosis not present

## 2021-08-13 DIAGNOSIS — S31109A Unspecified open wound of abdominal wall, unspecified quadrant without penetration into peritoneal cavity, initial encounter: Secondary | ICD-10-CM | POA: Diagnosis not present

## 2021-08-13 DIAGNOSIS — Z933 Colostomy status: Secondary | ICD-10-CM | POA: Diagnosis not present

## 2021-08-13 DIAGNOSIS — Z4801 Encounter for change or removal of surgical wound dressing: Secondary | ICD-10-CM | POA: Diagnosis not present

## 2021-08-14 DIAGNOSIS — S31109A Unspecified open wound of abdominal wall, unspecified quadrant without penetration into peritoneal cavity, initial encounter: Secondary | ICD-10-CM | POA: Diagnosis not present

## 2021-08-14 DIAGNOSIS — Z4801 Encounter for change or removal of surgical wound dressing: Secondary | ICD-10-CM | POA: Diagnosis not present

## 2021-08-14 DIAGNOSIS — Z933 Colostomy status: Secondary | ICD-10-CM | POA: Diagnosis not present

## 2021-08-14 DIAGNOSIS — Z85038 Personal history of other malignant neoplasm of large intestine: Secondary | ICD-10-CM | POA: Diagnosis not present

## 2021-08-14 MED FILL — Dexamethasone Sodium Phosphate Inj 100 MG/10ML: INTRAMUSCULAR | Qty: 1 | Status: AC

## 2021-08-17 ENCOUNTER — Inpatient Hospital Stay (HOSPITAL_BASED_OUTPATIENT_CLINIC_OR_DEPARTMENT_OTHER): Payer: BC Managed Care – PPO | Admitting: Hematology

## 2021-08-17 ENCOUNTER — Inpatient Hospital Stay: Payer: BC Managed Care – PPO

## 2021-08-17 ENCOUNTER — Other Ambulatory Visit: Payer: Self-pay

## 2021-08-17 ENCOUNTER — Encounter: Payer: Self-pay | Admitting: Hematology

## 2021-08-17 ENCOUNTER — Other Ambulatory Visit: Payer: Self-pay | Admitting: Internal Medicine

## 2021-08-17 ENCOUNTER — Other Ambulatory Visit (HOSPITAL_COMMUNITY): Payer: Self-pay

## 2021-08-17 VITALS — BP 132/96 | HR 104 | Temp 98.2°F | Resp 18 | Wt 179.0 lb

## 2021-08-17 DIAGNOSIS — Z5189 Encounter for other specified aftercare: Secondary | ICD-10-CM | POA: Diagnosis not present

## 2021-08-17 DIAGNOSIS — Z452 Encounter for adjustment and management of vascular access device: Secondary | ICD-10-CM

## 2021-08-17 DIAGNOSIS — C187 Malignant neoplasm of sigmoid colon: Secondary | ICD-10-CM | POA: Diagnosis not present

## 2021-08-17 DIAGNOSIS — Z803 Family history of malignant neoplasm of breast: Secondary | ICD-10-CM | POA: Diagnosis not present

## 2021-08-17 DIAGNOSIS — G893 Neoplasm related pain (acute) (chronic): Secondary | ICD-10-CM | POA: Diagnosis not present

## 2021-08-17 DIAGNOSIS — Z79899 Other long term (current) drug therapy: Secondary | ICD-10-CM | POA: Diagnosis not present

## 2021-08-17 DIAGNOSIS — Z5111 Encounter for antineoplastic chemotherapy: Secondary | ICD-10-CM | POA: Diagnosis not present

## 2021-08-17 DIAGNOSIS — F418 Other specified anxiety disorders: Secondary | ICD-10-CM | POA: Diagnosis not present

## 2021-08-17 DIAGNOSIS — Z800 Family history of malignant neoplasm of digestive organs: Secondary | ICD-10-CM | POA: Diagnosis not present

## 2021-08-17 DIAGNOSIS — R110 Nausea: Secondary | ICD-10-CM | POA: Diagnosis not present

## 2021-08-17 DIAGNOSIS — I10 Essential (primary) hypertension: Secondary | ICD-10-CM | POA: Diagnosis not present

## 2021-08-17 DIAGNOSIS — Z515 Encounter for palliative care: Secondary | ICD-10-CM

## 2021-08-17 DIAGNOSIS — K5900 Constipation, unspecified: Secondary | ICD-10-CM | POA: Diagnosis not present

## 2021-08-17 LAB — CMP (CANCER CENTER ONLY)
ALT: 6 U/L (ref 0–44)
AST: 12 U/L — ABNORMAL LOW (ref 15–41)
Albumin: 3.2 g/dL — ABNORMAL LOW (ref 3.5–5.0)
Alkaline Phosphatase: 71 U/L (ref 38–126)
Anion gap: 9 (ref 5–15)
BUN: 9 mg/dL (ref 6–20)
CO2: 32 mmol/L (ref 22–32)
Calcium: 8.2 mg/dL — ABNORMAL LOW (ref 8.9–10.3)
Chloride: 97 mmol/L — ABNORMAL LOW (ref 98–111)
Creatinine: 0.46 mg/dL (ref 0.44–1.00)
GFR, Estimated: >60 mL/min (ref >60)
Glucose, Bld: 86 mg/dL (ref 70–99)
Potassium: 2.8 mmol/L — ABNORMAL LOW (ref 3.5–5.1)
Sodium: 138 mmol/L (ref 135–145)
Total Bilirubin: 0.4 mg/dL (ref 0.3–1.2)
Total Protein: 6.6 g/dL (ref 6.5–8.1)

## 2021-08-17 LAB — CBC WITH DIFFERENTIAL (CANCER CENTER ONLY)
Abs Immature Granulocytes: 0.02 10*3/uL (ref 0.00–0.07)
Basophils Absolute: 0 10*3/uL (ref 0.0–0.1)
Basophils Relative: 1 %
Eosinophils Absolute: 0 10*3/uL (ref 0.0–0.5)
Eosinophils Relative: 1 %
HCT: 28.4 % — ABNORMAL LOW (ref 36.0–46.0)
Hemoglobin: 9.1 g/dL — ABNORMAL LOW (ref 12.0–15.0)
Immature Granulocytes: 1 %
Lymphocytes Relative: 32 %
Lymphs Abs: 1.2 10*3/uL (ref 0.7–4.0)
MCH: 26.1 pg (ref 26.0–34.0)
MCHC: 32 g/dL (ref 30.0–36.0)
MCV: 81.6 fL (ref 80.0–100.0)
Monocytes Absolute: 0.6 10*3/uL (ref 0.1–1.0)
Monocytes Relative: 16 %
Neutro Abs: 1.8 10*3/uL (ref 1.7–7.7)
Neutrophils Relative %: 49 %
Platelet Count: 630 10*3/uL — ABNORMAL HIGH (ref 150–400)
RBC: 3.48 MIL/uL — ABNORMAL LOW (ref 3.87–5.11)
RDW: 15.3 % (ref 11.5–15.5)
WBC Count: 3.6 10*3/uL — ABNORMAL LOW (ref 4.0–10.5)
nRBC: 0 % (ref 0.0–0.2)

## 2021-08-17 LAB — PREGNANCY, URINE: Preg Test, Ur: NEGATIVE

## 2021-08-17 MED ORDER — ACETAMINOPHEN 325 MG PO TABS
650.0000 mg | ORAL_TABLET | Freq: Once | ORAL | Status: AC
  Administered 2021-08-17: 650 mg via ORAL
  Filled 2021-08-17: qty 2

## 2021-08-17 MED ORDER — MORPHINE SULFATE ER 30 MG PO TBCR: 30.0000 mg | EXTENDED_RELEASE_TABLET | Freq: Three times a day (TID) | ORAL | 0 refills | Status: DC

## 2021-08-17 MED ORDER — MORPHINE SULFATE ER 30 MG PO TBCR
30.0000 mg | EXTENDED_RELEASE_TABLET | Freq: Three times a day (TID) | ORAL | 0 refills | Status: DC
  Filled 2021-08-17 – 2021-09-11 (×2): qty 60, 20d supply, fill #0

## 2021-08-17 MED ORDER — OXYCODONE HCL 5 MG PO TABS
5.0000 mg | ORAL_TABLET | Freq: Four times a day (QID) | ORAL | 0 refills | Status: DC | PRN
  Filled 2021-08-17: qty 90, 12d supply, fill #0

## 2021-08-17 MED ORDER — SODIUM CHLORIDE 0.9 % IV SOLN
2400.0000 mg/m2 | INTRAVENOUS | Status: DC
  Administered 2021-08-17: 4750 mg via INTRAVENOUS
  Filled 2021-08-17: qty 95

## 2021-08-17 MED ORDER — LEUCOVORIN CALCIUM INJECTION 350 MG
400.0000 mg/m2 | Freq: Once | INTRAVENOUS | Status: AC
  Administered 2021-08-17: 788 mg via INTRAVENOUS
  Filled 2021-08-17: qty 39.4

## 2021-08-17 MED ORDER — DEXTROSE 5 % IV SOLN: Freq: Once | INTRAVENOUS | Status: AC

## 2021-08-17 MED ORDER — FLUOROURACIL CHEMO INJECTION 2.5 GM/50ML
400.0000 mg/m2 | Freq: Once | INTRAVENOUS | Status: AC
  Administered 2021-08-17: 800 mg via INTRAVENOUS
  Filled 2021-08-17: qty 16

## 2021-08-17 MED ORDER — OXALIPLATIN CHEMO INJECTION 100 MG/20ML
85.0000 mg/m2 | Freq: Once | INTRAVENOUS | Status: AC
  Administered 2021-08-17: 165 mg via INTRAVENOUS
  Filled 2021-08-17: qty 33

## 2021-08-17 MED ORDER — OXYCODONE HCL 5 MG PO TABS
10.0000 mg | ORAL_TABLET | Freq: Once | ORAL | Status: AC
  Administered 2021-08-17: 10 mg via ORAL
  Filled 2021-08-17: qty 2

## 2021-08-17 MED ORDER — SODIUM CHLORIDE 0.9% FLUSH: 10.0000 mL | INTRAVENOUS | Status: DC | PRN

## 2021-08-17 MED ORDER — HEPARIN SOD (PORK) LOCK FLUSH 100 UNIT/ML IV SOLN: 500.0000 [IU] | Freq: Once | INTRAVENOUS | Status: DC | PRN

## 2021-08-17 MED ORDER — PALONOSETRON HCL INJECTION 0.25 MG/5ML
0.2500 mg | Freq: Once | INTRAVENOUS | Status: AC
  Administered 2021-08-17: 0.25 mg via INTRAVENOUS
  Filled 2021-08-17: qty 5

## 2021-08-17 MED ORDER — SODIUM CHLORIDE 0.9% FLUSH
10.0000 mL | Freq: Once | INTRAVENOUS | Status: AC
  Administered 2021-08-17: 10 mL

## 2021-08-17 MED ORDER — POTASSIUM CHLORIDE CRYS ER 20 MEQ PO TBCR
20.0000 meq | EXTENDED_RELEASE_TABLET | Freq: Once | ORAL | Status: AC
  Administered 2021-08-17: 20 meq via ORAL
  Filled 2021-08-17: qty 1

## 2021-08-17 MED ORDER — POTASSIUM CHLORIDE 10 MEQ/100ML IV SOLN
10.0000 meq | Freq: Once | INTRAVENOUS | Status: AC
  Administered 2021-08-17: 10 meq via INTRAVENOUS
  Filled 2021-08-17: qty 100

## 2021-08-17 MED ORDER — ALTEPLASE 2 MG IJ SOLR
2.0000 mg | Freq: Once | INTRAMUSCULAR | Status: AC | PRN
  Administered 2021-08-17: 2 mg
  Filled 2021-08-17: qty 2

## 2021-08-17 MED ORDER — SODIUM CHLORIDE 0.9 % IV SOLN
10.0000 mg | Freq: Once | INTRAVENOUS | Status: AC
  Administered 2021-08-17: 10 mg via INTRAVENOUS
  Filled 2021-08-17: qty 10

## 2021-08-17 MED ORDER — OXYCODONE HCL 5 MG PO TABS: 5.0000 mg | ORAL_TABLET | Freq: Four times a day (QID) | ORAL | 0 refills | Status: DC | PRN

## 2021-08-17 NOTE — Progress Notes (Signed)
[facility] notified Dr. Burr Medico during her appt that her prescriptions weren't going through at [facility] for her Oxycodone and MS Contin. I called [facility] and they were able to get the prescriptions to go through but didn't have any Oxycodone. I reached out to Dr. Hilma Favors who is going to move the Oxycodone prescription to [place] as [facility] is at [facility]. I notified [facility]. Understanding verbalized. All questions answered. Advised to reach out if there are any other issues.  ?

## 2021-08-17 NOTE — Progress Notes (Signed)
[place]   [phone]) [phone] [phone]   Clinic Follow up Note   Patient Care Team: [name], MD as PCP - General (Internal Medicine) [name], MD as PCP - Cardiology (Cardiology) [name], [name] as Physician Assistant (Emergency Medicine) [name], MD as Consulting Physician (Oncology) [name], RN as Oncology Nurse Navigator (Oncology) [name], MD as Consulting Physician (General Surgery) [name], [name], MD as Consulting Physician (Neurology) [name], [name], MD as Consulting Physician (Obstetrics and Gynecology) [name], MD as Consulting Physician (Gastroenterology) [name], [name], NP as Nurse Practitioner (Nurse Practitioner)  Date of Service:  08/17/2021  CHIEF COMPLAINT: f/u of sigmoid colon cancer  CURRENT THERAPY:  FOLFOX, q2 weeks, starting 06/22/21  ASSESSMENT & PLAN:  Mikayla Rios is a 36 y.o. female with   1. 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 She was found to have diverticulitis with perforation. She also developed an abscess requiring 2 drains. She continued to have abdominal pain and drainage, undergoing drain exchange three times. -she developed worsening weakness and was admitted on 04/10/21. She was taken for emergent small bowel and left colon resection on 04/15/21 under Dr. Johney Maine. 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 Margins negative for invasive carcinoma, but low-grade dysplasia involves proximal margin. All 5 lymph nodes negative (0/5). MSI low. -repeat CT AP on 06/16/21 showed progressive metastatic disease involving soft tissue masses in left psoas muscle and left lateral abdominal wall and peritoneal masses, and new mild retroperitoneal  lymphadenopathy.  -baseline CEA on 06/22/21 was elevated at 87.41. -she started FOLFOX on 06/22/21. She has tolerated well so far with mild n/v after pump d/c and cold sensitivity. -biopsy of the LUQ peritoneal mass on 07/10/21 confirmed metastatic adenocarcinoma with necrosis, consistent with colon primary. -her CEA is continuing to rise, most recently 113.33 on 08/03/21. -FO from peritoneal mass shows MSI-H disease, BRCA2, [name]6, and APC mutations among others which are not targetable. 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  -Due to the MSI high disease, she will likely respond well to immunotherapy Keytruda.  Plan to switch after her next ct scan. -labs reviewed, her potassium remains low. I advised her to increase to TID. Otherwise adequate to proceed with cycle 5 today.    2. Symptom Management: Abdominal pain, Weight loss -she continues to have severe pain at the incision site. She saw Dr. Johney Maine on 06/11/21, who said the pain could last up to a year due to the complexity of her surgery. -her pain is now being managed by palliative care NP Lexine Baton currently on MS contin 15 mg BID and percocet 1-2 PRN, she requires tabs q4-6 hours.   3. Anxiety and depression -Due to the prolonged illness and multiple hospital stay since May 2022, and cancer diagnosis, she has been feeling depressed lately. -cymbalta prescribed 05/25/21 -She had panic attack 2/13 after last chemo, when she was reacting to clorhexidine.  -F/up Dr. Michail Sermon   4. Genetics -FO showed MSI-High disease, and showed multiple other mutations including APC, [name]6, and BRCA2 mutations.  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 The full report has not been disclosed to Ms. Bryden yet and is not available to  me at this time -I indicated her children should seek testing, when appropriate. Her  25 yo son is eligible now, and possibly her 2 yo daughter who is mature. 59 yo child may be too young  -Her spouse is interested in testing, I let him know to speak with Faith Rogue when she calls to discuss pt's results.   5. Social Support -she is connected with Education officer, museum -she is working to apply for disability. Her job ended just prior to the start of her symptoms. -she has three children-- ages 35, 48, and 2.    PLAN: -proceed with cycle 5 FOLFOX today as planned -pain management per palliative NP Lexine Baton, f/u 3/15 -f/up genetics for full results and recommendations -increase oral K to 20 meq TID -F/up in 2 weeks with cycle 6 folfox -we will try to schedule restaging CT scan before next visit   No problem-specific Assessment & Plan notes found for this encounter.   SUMMARY OF ONCOLOGIC HISTORY: Oncology History Overview Note   Cancer Staging  Malignant neoplasm of sigmoid colon [facility]) Staging form: Colon and Rectum, AJCC 8th Edition - Pathologic stage from 04/15/2021: Stage IIC (pT4b, pN0, cM0) - Signed by [name], MD on 06/12/2021    Malignant neoplasm of sigmoid colon [facility])   Initial Diagnosis   Malignant neoplasm of sigmoid colon ([place])   11/04/2020 Imaging   EXAM: CT ABDOMEN AND PELVIS WITHOUT CONTRAST  IMPRESSION: 1. Perforating descending colonic diverticulitis with multiple abdominopelvic gas and fluid collections, measuring up to 5.7 cm and further described above. 2. Cholelithiasis without findings of acute cholecystitis. 3. 3.6 cm benign left adrenal adenoma. 4. Leiomyomatous uterus. 5. 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   11/07/2020 Imaging   EXAM: CT ABDOMEN AND PELVIS WITHOUT CONTRAST  IMPRESSION: Continued wall thickening is seen involving descending colon suggesting infectious or inflammatory colitis or perforated diverticulitis. There is an  adjacent fluid collection measuring 7.0 x 5.0 cm consistent with abscess which is significantly enlarged compared to prior exam. Adjacent to the abscess, there is a severely thickened small bowel loop most consistent with secondary inflammation.   5.2 x 3.5 cm fluid collection is noted within the left psoas muscle consistent with abscess which is significantly enlarged compared to prior exam. 4.4 x 3.8 cm fluid collection consistent with abscess is noted in the left retroperitoneal region which is also enlarged compared to prior exam.   Fibroid uterus.   Cholelithiasis.   11/27/2020 Imaging   EXAM: CT ABDOMEN AND PELVIS WITHOUT CONTRAST  IMPRESSION: 1. Significantly interval decreased size of the previously visualized left retroperitoneal and left anterior mid abdominal fluid collections. There is persistent fat stranding and mural thickening of the mid descending colon at this level. 2. Unchanged leiomyomatous uterus. 3. Unchanged cholelithiasis.   12/11/2020 Imaging   EXAM: CT ABDOMEN AND PELVIS WITHOUT CONTRAST  IMPRESSION: 1. Stable inflammatory changes centered around the distal descending colon in the left lower abdomen. Pericolonic inflammatory changes have minimally changed since 11/27/2020. Small pocket of gas medial to the colon is probably associated with a fistula or small residual abscess collection. 2. Stable position of the two percutaneous drains. The more anterior drain may be extending through a portion of the small bowel. 3. Cholelithiasis. 4. Fibroid uterus. Cannot exclude an ovarian/adnexal cystic structure near the uterine fundus. 5. Left adrenal adenoma.   01/07/2021 Imaging   EXAM: CT ABDOMEN AND PELVIS WITH  CONTRAST  IMPRESSION: 1. No new abscesses identified. Similar degree of soft tissue thickening seen in the left pericolonic region. The degree of persistent soft tissues thickening in the pericolonic space further raises suspicions for malignancy.  Further evaluation with colonoscopy should be performed. 2. Left retroperitoneal abscess drain unchanged in position. Anterior left abdominal drain again seen terminating within small bowel loop. 3. 2.6 cm mildly sclerotic lesion noted in the L2 vertebral body. Further evaluation with contrast enhanced lumbar spine MRI should be performed.   02/04/2021 Imaging   EXAM: CT ABDOMEN AND PELVIS WITH CONTRAST  IMPRESSION: No new abdominopelvic collections or abscess development in the 1 month interval.   Left anterior drain remains within a loop of small bowel, unchanged.   Left lateral abscess drain remains in the retroperitoneal space adjacent to the iliopsoas muscle with a small amount of residual fluid and air but no measurable collection.   Stable soft tissue prominence and pericolonic strandy edema/inflammation about the left descending colon compatible with residual diverticulitis/colitis. Difficult to exclude underlying transmural lesion.   04/01/2021 Imaging   EXAM: CT ABDOMEN AND PELVIS WITH CONTRAST  IMPRESSION: There appears to be significant enhancement and wall thickening involving the descending colon with some degree of traction and involvement of adjacent small bowel loops. This is consistent with the history of diverticulitis and perforation. Stable position of percutaneous drainage catheter is seen adjacent to left psoas muscle with no significant residual fluid remaining. The other percutaneous drainage catheter that was previously noted to be within small bowel loop on prior exam in the left lower quadrant, has significantly retracted and appears to be outside of the peritoneal space at this time.   Since the prior exam, there does appear to be some degree of rotation involving mesenteric vessels and structures in the right lower quadrant, suggesting partial volvulus or malrotation. There is the interval development of several lymph nodes in this area,  most likely inflammatory or reactive in etiology. Mild amount of free fluid is also noted in the pelvis. However, no significant bowel wall thickening or dilatation is seen in this area. These results will be called to the ordering clinician or representative by the Radiologist Assistant, and communication documented in the PACS or zVision Dashboard.   Stable uterine fibroid.   Hepatic steatosis.   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   Cholelithiasis.   04/10/2021 Imaging   EXAM: CT ANGIOGRAPHY CHEST CT ABDOMEN AND PELVIS WITH CONTRAST  IMPRESSION: 1. Again seen are findings compatible with descending colon diverticulitis with perforation. Free air and inflammation have increased in the interval. 2. New lobulated enhancing fluid collection posterior to this segment of inflamed colon measuring 8.5 x 3.5 x 10.0 cm. This collection now invades the adjacent iliopsoas muscle as well as extends through the left lateral abdominal wall. The tip of the drainage catheter is in this collection. Findings are compatible with abscess. 3. Anterior left percutaneous drainage catheter tip has been pulled back and is now within the subcutaneous tissues. 4. Trace free fluid. 5. No pulmonary embolism.  No acute cardiopulmonary process. 6. Cholelithiasis. 7. Fatty infiltration of the liver.   04/15/2021 Definitive Surgery   FINAL MICROSCOPIC DIAGNOSIS:   A. SMALL BOWEL, RESECTION:  - Adenocarcinoma.  - No carcinoma identified in 1 lymph node.   B. PERFORATED LEFT COLON, RESECTION:  - Moderately differentiated colonic adenocarcinoma.  - Tumor extends into pericolonic adipose tissue, and is strongly  suggestive of invasion into small bowel.  See oncology table/comments.  -  No carcinoma identified in 4 lymph nodes (0/4).  - Tubular adenoma with high-grade dysplasia, 1.  - Tubular adenomas with low grade dysplasia, 3.   Comments: The size of the tumor is difficult to estimate secondary to the  disrupted nature of the specimen, as well as the infiltrative nature of the tumor.  Tumor can be identified as definitely invading into the pericolonic adipose tissue (block B4), but given the extreme disruption of the tissue, and the involvement of the small bowel by what appears to be a colonic adenocarcinoma, I favor perforation of the large bowel with direct invasion into the small bowel.  Accordingly, I believe this is best regarded as a pT4b lesion.   ADDENDUM:  Mismatch Repair Protein (IHC)  SUMMARY INTERPRETATION: ABNORMAL  There is loss of the major MMR protein [name]6: This indicates a high probability that a hereditary germline mutation is present and referral to genetic counseling is warranted. It is recommended that the loss of protein expression be correlated with molecular based microsatellite instability testing.   IHC EXPRESSION RESULTS  TEST           RESULT  MLH1:          Preserved nuclear expression  [name]2:          Preserved nuclear expression  [name]6:          LOSS OF NUCLEAR EXPRESSION  PMS2:          Preserved nuclear expression    04/15/2021 Cancer Staging   Staging form: Colon and Rectum, AJCC 8th Edition - Pathologic stage from 04/15/2021: Stage IIC (pT4b, pN0, cM0) - Signed by [name], MD on 06/12/2021 Stage prefix: Initial diagnosis Total positive nodes: 0 Histologic grading system: 4 grade system Histologic grade (G): G2 Residual tumor (R): R0 - None    04/29/2021 Imaging   EXAM: CT ABDOMEN AND PELVIS WITH CONTRAST  IMPRESSION: Continued left retroperitoneal abscesses inferior to the left kidney, involving the left psoas muscle and left abdominal wall musculature. Overall size is decreased since prior study. Interval removal of left lower quadrant abscess drainage catheter.   New fluid collection in the cul-de-sac and wrapping around the uterus concerning for abscess.   Cholelithiasis.   Small left pleural effusion.  Bibasilar atelectasis.   Stable left  adrenal adenoma.  ADDENDUM: After discussing the case with Dr. Dwaine Gale in interventional radiology, it was noted that the fluid in the pelvis originally thought to be in the cul-de-sac is likely within the vagina, best seen on sagittal imaging. Recommend speculum exam.   Also, in the left lateral wall in the area of prior abscess in the lateral wall abdominal musculature, some of the abdomen soft tissue appears to be enhancing. While this could be infectious, cannot exclude tumor seeding in the left lateral abdominal wall, measuring 6.9 x 3.8 cm on image 64 of series 2.   06/16/2021 Imaging   EXAM: CT ABDOMEN AND PELVIS WITH CONTRAST  IMPRESSION: Increased size of bulky soft tissue masses involving the left psoas muscle and left lateral abdominal wall soft tissues, consistent with progressive metastatic disease.   Significant progression of multiple peritoneal masses throughout the abdomen and pelvis, consistent with peritoneal carcinomatosis.   New mild retroperitoneal lymphadenopathy, consistent with metastatic disease.   Stable uterine fibroid and left adrenal adenoma.   Cholelithiasis, without evidence of cholecystitis.   06/22/2021 -  Chemotherapy   Patient is on Treatment Plan : COLORECTAL FOLFOX q14d x 6 months     06/22/2021  Tumor Marker   Patient's tumor was tested for the following markers: CEA. Results of the tumor marker test revealed 87.41.   07/10/2021 Pathology Results   FINAL MICROSCOPIC DIAGNOSIS:   A. PERITONEAL MASS, LEFT UPPER ABDOMINAL QUADRANT, BIOPSY:  -  Metastatic adenocarcinoma with necrosis, histologically consistent with colon primary.     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 The report date is 07/16/2021. The remainder of testing was negative/normal.  The CancerNext-Expanded + RNAinsight gene panel offered by [employer] and includes sequencing and rearrangement analysis  for the following 77 genes: IP, ALK, APC*, ATM*, AXIN2, BAP1, BARD1, BLM, BMPR1A, BRCA1*, BRCA2*, BRIP1*, CDC73, CDH1*,CDK4, CDKN1B, CDKN2A, CHEK2*, CTNNA1, DICER1, FANCC, FH, FLCN, GALNT12, KIF1B, LZTR1, MAX, MEN1, MET, MLH1*, [name]2*, [name]3, [name]6*, MUTYH*, NBN, NF1*, NF2, NTHL1, PALB2*, PHOX2B, PMS2*, POT1, PRKAR1A, PTCH1, PTEN*, RAD51C*, RAD51D*,RB1, RECQL, RET, SDHA, SDHAF2, SDHB, SDHC, SDHD, SMAD4, SMARCA4, SMARCB1, SMARCE1, STK11, SUFU, TMEM127, TP53*,TSC1, TSC2, VHL and XRCC2 (sequencing and deletion/duplication); EGFR, EGLN1, HOXB13, KIT, MITF, PDGFRA, POLD1 and POLE (sequencing only); EPCAM and GREM1 (deletion/duplication only).      INTERVAL HISTORY:  NONIE LOCHNER is here for a follow up of colon cancer. She was last seen by NP Lacie on 08/03/21. She presents to the clinic accompanied by her husband. She reports she had nausea but no vomiting with last cycle. She notes she is trying to eat well or supplement with Ensure or Boost. She reports her pain seems to be improving-- she describes how she is able to wait longer between taking pain medicine. She also reports recent diarrhea in the last few days.   All other systems were reviewed with the patient and are negative.  MEDICAL HISTORY:  Past Medical History:  Diagnosis Date   Allergy    seasonal   Anemia    Blood infection 1985   Blood transfusion without reported diagnosis    Diverticulitis    Family history of breast cancer    Family history of colon cancer    Family history of stomach cancer    Hypertension    Obesity    Sleep apnea     SURGICAL HISTORY: Past Surgical History:  Procedure Laterality Date   COLECTOMY WITH COLOSTOMY CREATION/HARTMANN PROCEDURE N/A 04/15/2021   Procedure: COLOSTOMY CREATION/HARTMANN PROCEDURE;  Surgeon: [name], MD;  Location: [facility];  Service: General;  Laterality: N/A;   IR CATHETER TUBE CHANGE  12/12/2020   IR CATHETER TUBE CHANGE  01/27/2021   IR CATHETER TUBE CHANGE  02/19/2021    IR CATHETER TUBE CHANGE  04/13/2021   IR RADIOLOGIST EVAL & MGMT  11/27/2020   IR RADIOLOGIST EVAL & MGMT  12/11/2020   IR RADIOLOGIST EVAL & MGMT  01/07/2021   IR RADIOLOGIST EVAL & MGMT  02/04/2021   IR SINUS/FIST TUBE CHK-NON GI  12/12/2020   IR SINUS/FIST TUBE CHK-NON GI  02/19/2021   LAPAROSCOPIC PARTIAL COLECTOMY N/A 04/15/2021   Procedure: LAPAROSCOPIC ASSISTED HARTMANN RESECTION;  Surgeon: [name], MD;  Location: [facility];  Service: General;  Laterality: N/A;    I have reviewed the social history and family history with the patient and they are unchanged from previous note.  ALLERGIES:  is allergic to chlorhexidine gluconate, shellfish allergy, and penicillins.  MEDICATIONS:  Current Outpatient Medications  Medication Sig Dispense Refill   amLODipine (NORVASC) 10 MG tablet Take 10 mg by mouth daily.     dexamethasone (DECADRON) 4 MG tablet Take 1  tablet (4 mg total) by mouth daily. Take for 3-5 day after chemo (Patient not taking: Reported on 08/12/2021) 20 tablet 0   diclofenac Sodium (VOLTAREN) 1 % GEL Apply 4 g topically 4 (four) times daily as needed (for mild pain). 4 g 0   DULoxetine (CYMBALTA) 30 MG capsule Take 1 capsule (30 mg total) by mouth daily. 60 capsule 1   gabapentin (NEURONTIN) 300 MG capsule Take 1 capsule (300 mg) by mouth twice daily and Take 2 capsules (667m) by mouth at bedtime. 120 capsule 0   heparin lock flush 100 UNIT/ML SOLN injection Use 1 syringe (into each lumen) twice daily 600 mL 0   KLOR-CON M20 20 MEQ tablet TAKE 1 TABLET (20 MEQ TOTAL) BY MOUTH DAILY. TAKE 1 TAB TWICE DAILY FOR 3 DAYS THEN ONCE DAILY 30 tablet 1   metoprolol tartrate (LOPRESSOR) 50 MG tablet Take 1 tablet (50 mg total) by mouth 2 (two) times daily. 180 tablet 2   morphine (MS CONTIN) 30 MG 12 hr tablet Take 1 tablet by mouth every 8 hours. 60 tablet 0   ondansetron (ZOFRAN) 8 MG tablet Take 1 tablet (8 mg total) by mouth 2 (two) times daily as needed for refractory nausea /  vomiting. Start on day 3 after chemotherapy. 30 tablet 1   oxyCODONE (OXY IR/ROXICODONE) 5 MG immediate release tablet Take 1 to 2 tablets by mouth every 6  hours as needed for severe pain, moderate pain or breakthrough pain. 90 tablet 0   prochlorperazine (COMPAZINE) 10 MG tablet Take 1 tablet (10 mg total) by mouth every 6 (six) hours as needed (Nausea or vomiting). 30 tablet 1   Sodium Chloride Flush (NORMAL SALINE FLUSH) 0.9 % SOLN Inject 10 mLs (1 syringe) into lumen twice daily as directed 1200 mL 2   No current facility-administered medications for this visit.    PHYSICAL EXAMINATION: ECOG PERFORMANCE STATUS: 2 - Symptomatic, <50% confined to bed  There were no vitals filed for this visit. Wt Readings from Last 3 Encounters:  08/17/21 179 lb (81.2 kg)  08/03/21 182 lb 8 oz (82.8 kg)  07/20/21 190 lb 4.8 oz (86.3 kg)     GENERAL:alert, no distress and comfortable SKIN: skin color normal, no rashes or significant lesions EYES: normal, Conjunctiva are pink and non-injected, sclera clear  NEURO: alert & oriented x 3 with fluent speech  LABORATORY DATA:  I have reviewed the data as listed CBC Latest Ref Rng & Units 08/17/2021 08/03/2021 07/20/2021  WBC 4.0 - 10.5 K/uL 3.6(L) 8.0 9.4  Hemoglobin 12.0 - 15.0 g/dL 9.1(L) 10.3(L) 10.8(L)  Hematocrit 36.0 - 46.0 % 28.4(L) 32.8(L) 32.6(L)  Platelets 150 - 400 K/uL 630(H) 456(H) 567(H)     CMP Latest Ref Rng & Units 08/17/2021 08/03/2021 07/20/2021  Glucose 70 - 99 mg/dL 86 97 113(H)  BUN 6 - 20 mg/dL 9 16 11   Creatinine 0.44 - 1.00 mg/dL 0.46 0.52 0.38(L)  Sodium 135 - 145 mmol/L 138 142 136  Potassium 3.5 - 5.1 mmol/L 2.8(L) 3.2(L) 2.8(L)  Chloride 98 - 111 mmol/L 97(L) 99 97(L)  CO2 22 - 32 mmol/L 32 34(H) 29  Calcium 8.9 - 10.3 mg/dL 8.2(L) 9.3 9.1  Total Protein 6.5 - 8.1 g/dL 6.6 7.3 -  Total Bilirubin 0.3 - 1.2 mg/dL 0.4 0.4 -  Alkaline Phos 38 - 126 U/L 71 52 -  AST 15 - 41 U/L 12(L) 11(L) -  ALT 0 - 44 U/L 6 5 -  RADIOGRAPHIC STUDIES: I have personally reviewed the radiological images as listed and agreed with the findings in the report. No results found.    No orders of the defined types were placed in this encounter.  All questions were answered. The patient knows to call the clinic with any problems, questions or concerns. No barriers to learning was detected. The total time spent in the appointment was 30 minutes.     [name], MD 08/17/2021   I, Wilburn Mylar, am acting as scribe for [name], MD.   I have reviewed the above documentation for accuracy and completeness, and I agree with the above.

## 2021-08-17 NOTE — Patient Instructions (Signed)
[facility]  Discharge Instructions: ?Thank you for choosing [facility] to provide your oncology and hematology care.  ? ?If you have a lab appointment with the Cancer Center, please go directly to the Cancer Center and check in at the registration area. ?  ?Wear comfortable clothing and clothing appropriate for easy access to any Portacath or PICC line.  ? ?We strive to give you quality time with your provider. You may need to reschedule your appointment if you arrive late (15 or more minutes).  Arriving late affects you and other patients whose appointments are after yours.  Also, if you miss three or more appointments without notifying the office, you may be dismissed from the clinic at the provider?s discretion.    ?  ?For prescription refill requests, have your pharmacy contact our office and allow 72 hours for refills to be completed.   ? ?Today you received the following chemotherapy and/or immunotherapy agents: Oxaliplatin, Leucovorin, Fluorouracil.     ?  ?To help prevent nausea and vomiting after your treatment, we encourage you to take your nausea medication as directed. ? ?BELOW ARE SYMPTOMS THAT SHOULD BE REPORTED IMMEDIATELY: ?*FEVER GREATER THAN 100.4 F (38 ?C) OR HIGHER ?*CHILLS OR SWEATING ?*NAUSEA AND VOMITING THAT IS NOT CONTROLLED WITH YOUR NAUSEA MEDICATION ?*UNUSUAL SHORTNESS OF BREATH ?*UNUSUAL BRUISING OR BLEEDING ?*URINARY PROBLEMS (pain or burning when urinating, or frequent urination) ?*BOWEL PROBLEMS (unusual diarrhea, constipation, pain near the anus) ?TENDERNESS IN MOUTH AND THROAT WITH OR WITHOUT PRESENCE OF ULCERS (sore throat, sores in mouth, or a toothache) ?UNUSUAL RASH, SWELLING OR PAIN  ?UNUSUAL VAGINAL DISCHARGE OR ITCHING  ? ?Items with * indicate a potential emergency and should be followed up as soon as possible or go to the Emergency Department if any problems should occur. ? ?Please show the CHEMOTHERAPY ALERT CARD or  IMMUNOTHERAPY ALERT CARD at check-in to the Emergency Department and triage nurse. ? ?Should you have questions after your visit or need to cancel or reschedule your appointment, please contact [facility]  Dept: [phone]  and follow the prompts.  Office hours are 8:00 a.m. to 4:30 p.m. Monday - Friday. Please note that voicemails left after 4:00 p.m. may not be returned until the following business day.  We are closed weekends and major holidays. You have access to a nurse at all times for urgent questions. Please call the main number to the clinic Dept: [phone] and follow the prompts. ? ? ?For any non-urgent questions, you may also contact your provider using MyChart. We now offer e-Visits for anyone 18 and older to request care online for non-urgent symptoms. For details visit [email or web address]. ?  ?Also download the MyChart app! Go to the app store, search "MyChart", open the app, select [facility], and log in with your MyChart username and password. ? ?Due to Covid, a mask is required upon entering the hospital/clinic. If you do not have a mask, one will be given to you upon arrival. For doctor visits, patients may have 1 support person aged 18 or older with them. For treatment visits, patients cannot have anyone with them due to current Covid guidelines and our immunocompromised population.  ? ?

## 2021-08-17 NOTE — Progress Notes (Signed)
Per Dr. Burr Medico, ok to treat with heart rate of 104.  ?

## 2021-08-17 NOTE — Telephone Encounter (Signed)
Refills approved for early fill. Currently titrating medications for pain. ? ?

## 2021-08-19 ENCOUNTER — Other Ambulatory Visit: Payer: Self-pay

## 2021-08-19 ENCOUNTER — Encounter: Payer: Self-pay | Admitting: Nurse Practitioner

## 2021-08-19 ENCOUNTER — Inpatient Hospital Stay: Payer: BC Managed Care – PPO

## 2021-08-19 ENCOUNTER — Inpatient Hospital Stay (HOSPITAL_BASED_OUTPATIENT_CLINIC_OR_DEPARTMENT_OTHER): Payer: BC Managed Care – PPO | Admitting: Nurse Practitioner

## 2021-08-19 VITALS — BP 143/108 | HR 121 | Temp 98.1°F | Resp 18 | Wt 177.3 lb

## 2021-08-19 DIAGNOSIS — Z5111 Encounter for antineoplastic chemotherapy: Secondary | ICD-10-CM | POA: Diagnosis not present

## 2021-08-19 DIAGNOSIS — Z5189 Encounter for other specified aftercare: Secondary | ICD-10-CM | POA: Diagnosis not present

## 2021-08-19 DIAGNOSIS — R634 Abnormal weight loss: Secondary | ICD-10-CM

## 2021-08-19 DIAGNOSIS — R110 Nausea: Secondary | ICD-10-CM

## 2021-08-19 DIAGNOSIS — G893 Neoplasm related pain (acute) (chronic): Secondary | ICD-10-CM

## 2021-08-19 DIAGNOSIS — Z515 Encounter for palliative care: Secondary | ICD-10-CM | POA: Diagnosis not present

## 2021-08-19 DIAGNOSIS — I10 Essential (primary) hypertension: Secondary | ICD-10-CM | POA: Diagnosis not present

## 2021-08-19 DIAGNOSIS — C187 Malignant neoplasm of sigmoid colon: Secondary | ICD-10-CM | POA: Diagnosis not present

## 2021-08-19 DIAGNOSIS — Z800 Family history of malignant neoplasm of digestive organs: Secondary | ICD-10-CM | POA: Diagnosis not present

## 2021-08-19 DIAGNOSIS — Z79899 Other long term (current) drug therapy: Secondary | ICD-10-CM | POA: Diagnosis not present

## 2021-08-19 DIAGNOSIS — F418 Other specified anxiety disorders: Secondary | ICD-10-CM | POA: Diagnosis not present

## 2021-08-19 DIAGNOSIS — K5900 Constipation, unspecified: Secondary | ICD-10-CM | POA: Diagnosis not present

## 2021-08-19 DIAGNOSIS — K5903 Drug induced constipation: Secondary | ICD-10-CM | POA: Diagnosis not present

## 2021-08-19 DIAGNOSIS — Z803 Family history of malignant neoplasm of breast: Secondary | ICD-10-CM | POA: Diagnosis not present

## 2021-08-19 DIAGNOSIS — Z452 Encounter for adjustment and management of vascular access device: Secondary | ICD-10-CM

## 2021-08-19 MED ORDER — SODIUM CHLORIDE 0.9% FLUSH
10.0000 mL | INTRAVENOUS | Status: DC | PRN
  Administered 2021-08-19: 10 mL

## 2021-08-19 MED ORDER — HEPARIN SOD (PORK) LOCK FLUSH 100 UNIT/ML IV SOLN
500.0000 [IU] | Freq: Once | INTRAVENOUS | Status: AC | PRN
  Administered 2021-08-19: 500 [IU]

## 2021-08-19 NOTE — Progress Notes (Signed)
Patient was here for a pump start stop. Flushed both lumens, no blood return in both. Mikayla Rios is aware that she may receive cathflo on next visit if no blood return.Marland Kitchen ?

## 2021-08-19 NOTE — Progress Notes (Signed)
? ?  ?Palliative Medicine ?[place]  ?Telephone:(336) [phone] [phone] ? ? ?Name: Mikayla Rios ?Date: 08/19/2021 ?MRN: [id number]  ?DOB: 11/20/1985 ? ?Patient Care Team: ?Mikayla Chard, MD as PCP - General (Internal Medicine) ?Mikayla Heinz, MD as PCP - Cardiology (Cardiology) ?Mikayla Rios, Mikayla Rios as Librarian, academic (Emergency Medicine) ?Mikayla Merle, MD as Consulting Physician (Oncology) ?Mikayla Rios, Mikayla Goody, RN as Sales executive (Oncology) ?Mikayla Boston, MD as Consulting Physician (General Surgery) ?Mikayla Rios, Mikayla Partridge, MD as Consulting Physician (Neurology) ?Mikayla Fowler, MD as Consulting Physician (Obstetrics and Gynecology) ?Mikayla Mayer, MD as Consulting Physician (Gastroenterology) ?Mikayla Rios, Mikayla Sax, NP as Nurse Practitioner (Nurse Practitioner)  ? ? ?INTERVAL HISTORY: ?Mikayla Rios is a 36 y.o. 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 Recent CT scan on 06/16/21 showed progressive metastatic disease. Palliative ask to see for symptom management. ?  ? ?SOCIAL HISTORY:    ? reports that she has never smoked. She has never used smokeless tobacco. She reports that she does not currently use alcohol. She reports that she does not use drugs. ? ?ADVANCE DIRECTIVES:  ? ? ?CODE STATUS:  ? ?PAST MEDICAL HISTORY: ?Past Medical History:  ?Diagnosis Date  ? Allergy   ? seasonal  ? Anemia   ? Blood infection 1985  ? Blood transfusion without reported diagnosis   ? Diverticulitis   ? Family history of breast cancer   ? Family history of colon cancer   ? Family history of stomach cancer   ? Hypertension   ? Obesity   ? Sleep apnea   ? ? ?ALLERGIES:  is allergic to chlorhexidine gluconate, shellfish allergy, and penicillins. ? ?MEDICATIONS:  ?Current Outpatient Medications  ?Medication Sig Dispense Refill  ? amLODipine (NORVASC) 10 MG  tablet Take 10 mg by mouth daily.    ? dexamethasone (DECADRON) 4 MG tablet Take 1 tablet (4 mg total) by mouth daily. Take for 3-5 day after chemo (Patient not taking: Reported on 08/12/2021) 20 tablet 0  ? diclofenac Sodium (VOLTAREN) 1 % GEL Apply 4 g topically 4 (four) times daily as needed (for mild pain). 4 g 0  ? DULoxetine (CYMBALTA) 30 MG capsule Take 1 capsule (30 mg total) by mouth daily. 60 capsule 1  ? gabapentin (NEURONTIN) 300 MG capsule Take 1 capsule (300 mg) by mouth twice daily and Take 2 capsules ('600mg'$ ) by mouth at bedtime. 120 capsule 0  ? heparin lock flush 100 UNIT/ML SOLN injection Use 1 syringe (into each lumen) twice daily 600 mL 0  ? KLOR-CON M20 20 MEQ tablet TAKE 1 TABLET (20 MEQ TOTAL) BY MOUTH DAILY. TAKE 1 TAB TWICE DAILY FOR 3 DAYS THEN ONCE DAILY 30 tablet 1  ? metoprolol tartrate (LOPRESSOR) 50 MG tablet Take 1 tablet (50 mg total) by mouth 2 (two) times daily. 180 tablet 2  ? morphine (MS CONTIN) 30 MG 12 hr tablet Take 1 tablet by mouth every 8 hours. 60 tablet 0  ? ondansetron (ZOFRAN) 8 MG tablet Take 1 tablet (8 mg total) by mouth 2 (two) times daily as needed for refractory nausea / vomiting. Start on day 3 after chemotherapy. 30 tablet 1  ? oxyCODONE (OXY IR/ROXICODONE) 5 MG immediate release tablet Take 1 to 2 tablets by mouth every 6  hours as needed for severe pain, moderate pain or breakthrough pain. 90 tablet 0  ? prochlorperazine (COMPAZINE) 10 MG tablet Take  1 tablet (10 mg total) by mouth every 6 (six) hours as needed (Nausea or vomiting). 30 tablet 1  ? Sodium Chloride Flush (NORMAL SALINE FLUSH) 0.9 % SOLN Inject 10 mLs (1 syringe) into lumen twice daily as directed 1200 mL 2  ? ?No current facility-administered medications for this visit.  ? ?Facility-Administered Medications Ordered in Other Visits  ?Medication Dose Route Frequency Provider Last Rate Last Admin  ? heparin lock flush 100 unit/mL  500 Units Intracatheter Once PRN Mikayla Merle, MD      ? sodium chloride  flush (NS) 0.9 % injection 10 mL  10 mL Intracatheter PRN Mikayla Merle, MD      ? ? ?VITAL SIGNS: ?BP (!) 143/108 (BP Location: Left Arm, Patient Position: Sitting)   Pulse (!) 121   Temp 98.1 ?F (36.7 ?C) (Oral)   Resp 18   Wt 177 lb 5 oz (80.4 kg)   SpO2 100%   BMI 31.41 kg/m?  ?Filed Weights  ? 08/19/21 1426  ?Weight: 177 lb 5 oz (80.4 kg)  ? ?  ?Estimated body mass index is 31.41 kg/m? as calculated from the following: ?  Height as of 08/12/21: '5\' 3"'$  (1.6 m). ?  Weight as of this encounter: 177 lb 5 oz (80.4 kg). ? ? ?PERFORMANCE STATUS (ECOG) : 1 - Symptomatic but completely ambulatory ? ? ?Physical Exam ?General: NAD, in wheelchair  ?Cardiovascular:RRR ?Abdomen: soft, tender, + bowel sounds, colostomy in place ?Neurological: AAO x4, mood appropriate ? ?IMPRESSION: ? ?Mikayla Rios presents to clinic today for symptom management follow-up. No acute distress noted. Her husband is with her. She is in a wheelchair due to some fatigue when walking long distances. Also scheduled to have pump disconnected today.  ? ?Neoplams related pain ?Mikayla Rios reports her pain is well managed currently. She is tolerating regimen of MS Contin 30 mg three times daily. States pain is controlled and does escalate around 7-8 hrs. Is taking Oxy IR 5-10 mg as needed for breakthrough pain. Reports she does not have to take around the clock currently which she is appreciative of. However does have some days which are better than others. Reports noticeable difference when she tries to do more around the house. Her body will generally tell her she is doing to much and pain is escalated. Encouraged her to listen to her body and take it easy. It is ok to take rest breaks or not do everything all at one time. She and husband verbalized understanding.  ? ?We will continue to closely monitor.  ? ?Anxiety  ?Continues to focus on her health and life one day at at time. Focusing on the positives, time with family, and remaining hopeful. She has a  strong spiritual foundation. She is being followed by Dr. Nelida Rios which she is appreciative of.  ? ?3. Constipation ?Constipation improved with Miralax.  ? ?4. Nausea/decreased appetite  ?Henli does have an appetite however reports nausea around meal times or after eating which impacts her appetite and the amount she eats. Compazine is helpful. Also has zofran available. Emphasized taking antiemetics prior to meals to aid in some relief. Also decrease sudden exposures to highly aromatized foods (which tends to be hot meals). Allowing time to sit on table before eating or sitting in front of food. Encouraged small frequent meals versus trying to eat 3 large meals a day.  ? ?She is drinking Ensure 1-2 times daily and tolerating.  ? ?Her current weight is 177lbs down from 179 lbs on  3/13, 182 lbs on 2/27. We discussed appetite stimulant but with plans to closely monitor before starting. Hopefully once nausea is more controlled around meals this will have an impact on her intake.  ? ?I discussed the importance of continued conversation with family and their medical providers regarding overall plan of care and treatment options, ensuring decisions are within the context of the patients values and GOCs. ? ?PLAN: ?Oxy IR 5-10 mg every 6 hours as needed for breakthrough pain ?Tolreating MS Contin 30 mg to every 8 hours.  ?Gabapentin 300 mg BID and '600mg'$  QHS ?Cymbalta 30 mg daily ?Education on increased protein intake and nutrition.  ?Miralax daily for constipation ?I will plan to see her back in 2-3 weeks in collaboration with future appointments.  ? ? ?Patient expressed understanding and was in agreement with this plan. She also understands that She can call the clinic at any time with any questions, concerns, or complaints.  ? ?Time Total: 30 min . ? ?Visit consisted of counseling and education dealing with the complex and emotionally intense issues of symptom management and palliative care in the setting of serious and  potentially life-threatening illness.Greater than 50%  of this time was spent counseling and coordinating care related to the above assessment and plan. ? ?Alda Lea, AGPCNP-BC  ?Palliative Medicin

## 2021-08-20 ENCOUNTER — Ambulatory Visit: Payer: BC Managed Care – PPO | Admitting: Psychologist

## 2021-08-24 ENCOUNTER — Ambulatory Visit: Payer: BC Managed Care – PPO | Admitting: Psychologist

## 2021-08-26 ENCOUNTER — Other Ambulatory Visit: Payer: Self-pay | Admitting: Internal Medicine

## 2021-08-27 ENCOUNTER — Other Ambulatory Visit (HOSPITAL_COMMUNITY): Payer: Self-pay

## 2021-08-27 DIAGNOSIS — D62 Acute posthemorrhagic anemia: Secondary | ICD-10-CM | POA: Diagnosis not present

## 2021-08-27 DIAGNOSIS — K5792 Diverticulitis of intestine, part unspecified, without perforation or abscess without bleeding: Secondary | ICD-10-CM | POA: Diagnosis not present

## 2021-08-27 DIAGNOSIS — E876 Hypokalemia: Secondary | ICD-10-CM | POA: Diagnosis not present

## 2021-08-27 DIAGNOSIS — E440 Moderate protein-calorie malnutrition: Secondary | ICD-10-CM | POA: Diagnosis not present

## 2021-08-27 DIAGNOSIS — Z791 Long term (current) use of non-steroidal anti-inflammatories (NSAID): Secondary | ICD-10-CM | POA: Diagnosis not present

## 2021-08-27 DIAGNOSIS — E669 Obesity, unspecified: Secondary | ICD-10-CM | POA: Diagnosis not present

## 2021-08-27 DIAGNOSIS — C189 Malignant neoplasm of colon, unspecified: Secondary | ICD-10-CM | POA: Diagnosis not present

## 2021-08-27 DIAGNOSIS — Z433 Encounter for attention to colostomy: Secondary | ICD-10-CM | POA: Diagnosis not present

## 2021-08-27 DIAGNOSIS — I10 Essential (primary) hypertension: Secondary | ICD-10-CM | POA: Diagnosis not present

## 2021-08-27 DIAGNOSIS — Z4801 Encounter for change or removal of surgical wound dressing: Secondary | ICD-10-CM | POA: Diagnosis not present

## 2021-08-27 DIAGNOSIS — Z79891 Long term (current) use of opiate analgesic: Secondary | ICD-10-CM | POA: Diagnosis not present

## 2021-08-27 DIAGNOSIS — Z9049 Acquired absence of other specified parts of digestive tract: Secondary | ICD-10-CM | POA: Diagnosis not present

## 2021-08-27 DIAGNOSIS — Z452 Encounter for adjustment and management of vascular access device: Secondary | ICD-10-CM | POA: Diagnosis not present

## 2021-08-27 DIAGNOSIS — G4730 Sleep apnea, unspecified: Secondary | ICD-10-CM | POA: Diagnosis not present

## 2021-08-27 DIAGNOSIS — Z483 Aftercare following surgery for neoplasm: Secondary | ICD-10-CM | POA: Diagnosis not present

## 2021-08-27 DIAGNOSIS — Z6837 Body mass index (BMI) 37.0-37.9, adult: Secondary | ICD-10-CM | POA: Diagnosis not present

## 2021-08-28 ENCOUNTER — Other Ambulatory Visit: Payer: Self-pay

## 2021-08-28 ENCOUNTER — Other Ambulatory Visit: Payer: Self-pay | Admitting: Nurse Practitioner

## 2021-08-28 ENCOUNTER — Other Ambulatory Visit (HOSPITAL_COMMUNITY): Payer: Self-pay

## 2021-08-28 ENCOUNTER — Ambulatory Visit (HOSPITAL_COMMUNITY)
Admission: RE | Admit: 2021-08-28 | Discharge: 2021-08-28 | Disposition: A | Discharge status: Home / Self Care | Payer: BC Managed Care – PPO | Source: Ambulatory Visit | Attending: Nurse Practitioner | Admitting: Nurse Practitioner

## 2021-08-28 DIAGNOSIS — C187 Malignant neoplasm of sigmoid colon: Secondary | ICD-10-CM | POA: Insufficient documentation

## 2021-08-28 DIAGNOSIS — D259 Leiomyoma of uterus, unspecified: Secondary | ICD-10-CM | POA: Diagnosis not present

## 2021-08-28 DIAGNOSIS — Z452 Encounter for adjustment and management of vascular access device: Secondary | ICD-10-CM | POA: Diagnosis not present

## 2021-08-28 DIAGNOSIS — I2510 Atherosclerotic heart disease of native coronary artery without angina pectoris: Secondary | ICD-10-CM | POA: Diagnosis not present

## 2021-08-28 DIAGNOSIS — C189 Malignant neoplasm of colon, unspecified: Secondary | ICD-10-CM | POA: Diagnosis not present

## 2021-08-28 DIAGNOSIS — C7951 Secondary malignant neoplasm of bone: Secondary | ICD-10-CM | POA: Diagnosis not present

## 2021-08-28 DIAGNOSIS — K8020 Calculus of gallbladder without cholecystitis without obstruction: Secondary | ICD-10-CM | POA: Diagnosis not present

## 2021-08-28 MED ORDER — OXYCODONE HCL 5 MG PO TABS: 5.0000 mg | ORAL_TABLET | Freq: Four times a day (QID) | ORAL | 0 refills | Status: DC | PRN

## 2021-08-28 MED ORDER — IOHEXOL 300 MG/ML  SOLN
100.0000 mL | Freq: Once | INTRAMUSCULAR | Status: AC | PRN
  Administered 2021-08-28: 100 mL via INTRAVENOUS

## 2021-08-28 MED ORDER — OXYCODONE HCL 5 MG PO TABS
5.0000 mg | ORAL_TABLET | Freq: Four times a day (QID) | ORAL | 0 refills | Status: DC | PRN
  Filled 2021-08-28: qty 90, 12d supply, fill #0

## 2021-08-28 MED FILL — Dexamethasone Sodium Phosphate Inj 100 MG/10ML: INTRAMUSCULAR | Qty: 1 | Status: AC

## 2021-08-30 ENCOUNTER — Other Ambulatory Visit: Payer: Self-pay | Admitting: Hematology

## 2021-08-30 DIAGNOSIS — C187 Malignant neoplasm of sigmoid colon: Secondary | ICD-10-CM

## 2021-08-31 ENCOUNTER — Inpatient Hospital Stay: Payer: BC Managed Care – PPO

## 2021-08-31 ENCOUNTER — Other Ambulatory Visit: Payer: Self-pay | Admitting: Hematology

## 2021-08-31 ENCOUNTER — Encounter: Payer: Self-pay | Admitting: Hematology

## 2021-08-31 ENCOUNTER — Inpatient Hospital Stay (HOSPITAL_BASED_OUTPATIENT_CLINIC_OR_DEPARTMENT_OTHER): Payer: BC Managed Care – PPO | Admitting: Hematology

## 2021-08-31 ENCOUNTER — Other Ambulatory Visit: Payer: Self-pay

## 2021-08-31 DIAGNOSIS — Z5189 Encounter for other specified aftercare: Secondary | ICD-10-CM | POA: Diagnosis not present

## 2021-08-31 DIAGNOSIS — K5900 Constipation, unspecified: Secondary | ICD-10-CM | POA: Diagnosis not present

## 2021-08-31 DIAGNOSIS — I10 Essential (primary) hypertension: Secondary | ICD-10-CM | POA: Diagnosis not present

## 2021-08-31 DIAGNOSIS — C187 Malignant neoplasm of sigmoid colon: Secondary | ICD-10-CM

## 2021-08-31 DIAGNOSIS — Z79899 Other long term (current) drug therapy: Secondary | ICD-10-CM | POA: Diagnosis not present

## 2021-08-31 DIAGNOSIS — G893 Neoplasm related pain (acute) (chronic): Secondary | ICD-10-CM | POA: Diagnosis not present

## 2021-08-31 DIAGNOSIS — R110 Nausea: Secondary | ICD-10-CM | POA: Diagnosis not present

## 2021-08-31 DIAGNOSIS — Z803 Family history of malignant neoplasm of breast: Secondary | ICD-10-CM | POA: Diagnosis not present

## 2021-08-31 DIAGNOSIS — Z452 Encounter for adjustment and management of vascular access device: Secondary | ICD-10-CM

## 2021-08-31 DIAGNOSIS — F418 Other specified anxiety disorders: Secondary | ICD-10-CM | POA: Diagnosis not present

## 2021-08-31 DIAGNOSIS — Z800 Family history of malignant neoplasm of digestive organs: Secondary | ICD-10-CM | POA: Diagnosis not present

## 2021-08-31 DIAGNOSIS — Z5111 Encounter for antineoplastic chemotherapy: Secondary | ICD-10-CM | POA: Diagnosis not present

## 2021-08-31 DIAGNOSIS — Z95828 Presence of other vascular implants and grafts: Secondary | ICD-10-CM

## 2021-08-31 MED ORDER — SODIUM CHLORIDE 0.9% FLUSH
10.0000 mL | Freq: Once | INTRAVENOUS | Status: AC
  Administered 2021-08-31: 10 mL

## 2021-08-31 MED ORDER — HEPARIN SOD (PORK) LOCK FLUSH 100 UNIT/ML IV SOLN
500.0000 [IU] | INTRAVENOUS | Status: AC | PRN
  Administered 2021-08-31: 500 [IU]

## 2021-08-31 NOTE — Progress Notes (Signed)
Nutrition ? ?RD planning to see patient during infusion but infusion cancelled for today.  Patient rescheduled for 4/10 during infusion ? ?Deneen Slager B. Zenia Resides, RD, LDN ?Registered Dietitian ?[phone] ? ?

## 2021-08-31 NOTE — Progress Notes (Signed)
?Mikayla Rios   ?Telephone:(336) [phone] [phone]   ?Clinic Follow up Note  ? ?Patient Care Team: ?[name], MD as PCP - General (Internal Medicine) ?[name], MD as PCP - Cardiology (Cardiology) ?[name], [name] as Librarian, academic (Emergency Medicine) ?[name], MD as Consulting Physician (Oncology) ?Earl Gala, Deliah Goody, RN as Sales executive (Oncology) ?[name], MD as Consulting Physician (General Surgery) ?[name], [name], MD as Consulting Physician (Neurology) ?Cheri Fowler, MD as Consulting Physician (Obstetrics and Gynecology) ?[name], MD as Consulting Physician (Gastroenterology) ?[name], [name], NP as Nurse Practitioner (Nurse Practitioner) ? ?Date of Service:  08/31/2021 ? ?CHIEF COMPLAINT: f/u of sigmoid colon cancer ? ?CURRENT THERAPY:  ?PENDING Keytruda ? ?ASSESSMENT & PLAN:  ?Mikayla Rios is a 36 y.o. female with  ? ?1. 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 She was found to have diverticulitis with perforation. She also developed an abscess requiring 2 drains. She continued to have abdominal pain and drainage, undergoing drain exchange three times. ?-she developed worsening weakness and was admitted on 04/10/21. She was taken for emergent small bowel and left colon resection on 04/15/21 under Dr. Johney Maine. 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 Margins negative for invasive carcinoma, but low-grade dysplasia involves proximal margin. All 5 lymph nodes negative (0/5). MSI low. ?-repeat CT AP on 06/16/21 showed progressive metastatic disease involving soft tissue masses in left psoas muscle and left lateral abdominal wall and peritoneal masses, and new mild  retroperitoneal lymphadenopathy.  ?-baseline CEA on 06/22/21 was elevated at 87.41. ?-she started FOLFOX on 06/22/21. She has tolerated well so far with mild n/v after pump d/c and cold sensitivity. ?-biopsy of the LUQ peritoneal mass on 07/10/21 confirmed metastatic adenocarcinoma with necrosis, consistent with colon primary. ?-her CEA is continuing to rise, most recently 113.33 on 08/03/21. ?-FO from peritoneal mass shows MSI-H disease, BRCA2, [name]6, and APC mutations among others which are not targetable. 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  ?-restaging CT CAP on 08/28/21 showed: interval progression of multiple metastatic soft tissue nodules/masses in abdomen/pelvis; no evidence of metastatic disease in chest; similar appearance of subtle lesion in L2 vertebral body. I reviewed the results with them today. ?-we discussed switching to [facility] again today. I reviewed the side effects with them in detail, including fatigue, pneumonitis, skin rash, arthralgia, thyroid or other endocrine disorders, she voiced good understanding and agrees to proceed. We will work to get this approved as soon as possible. ?  ?2. Symptom Management: Abdominal pain, Weight loss ?-she continues to have severe pain at the incision site. She saw Dr. Johney Maine on 06/11/21, who said the pain could last up to a year due to the complexity of her surgery. ?-her pain is now being managed by palliative care NP Lexine Baton currently on MS contin 15 mg BID and percocet 1-2 PRN, she requires tabs q4-6 hours. ?  ?3. Anxiety and depression ?-Due to the prolonged illness and multiple hospital stay since May 2022, and cancer diagnosis, she has been feeling depressed lately. ?-cymbalta prescribed 05/25/21 ?-She had panic attack 2/13 after last chemo, when she was reacting to clorhexidine.  ?-F/up Dr. Michail Sermon ?  ?4. Genetics ?-FO showed MSI-High disease, and showed multiple other mutations  including  APC, [name]6, and BRCA2 mutations.  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 The full report has not been disclosed to Ms. Ronning yet and is not available to me at this time ?-I indicated her children should seek testing, when appropriate. Her 71 yo son is eligible now, and possibly her 10 yo daughter who is mature. 45 yo child may be too young  ?-Her spouse is interested in testing, I let him know to speak with Faith Rogue when she calls to discuss pt's results. ?  ?5. Social Support ?-she is connected with Education officer, museum ?-she is working to apply for disability. Her job ended just prior to the start of her symptoms. ?-she has three children-- ages 82, 29, and 57. ?  ?  ?PLAN: ?-I reviewed her CT scan, which unfortunately showed disease progression.  Cancel treatment today ?-we will work to get [employer] approved and start her later this week ? -move appointment with Lexine Baton to first day of treatment ?-lab, flush, and f/u with second Keytruda treatment 3 weeks after ? ? ?No problem-specific Assessment & Plan notes found for this encounter. ? ? ?SUMMARY OF ONCOLOGIC HISTORY: ?Oncology History Overview Note  ? Cancer Staging  ?Malignant neoplasm of sigmoid colon ([place]) ?Staging form: Colon and Rectum, AJCC 8th Edition ?- Pathologic stage from 04/15/2021: Stage IIC (pT4b, pN0, cM0) - Signed by [name], MD on 06/12/2021 ? ?  ?Malignant neoplasm of sigmoid colon ([place])  ? Initial Diagnosis  ? Malignant neoplasm of sigmoid colon [facility]) ?  ?11/04/2020 Imaging  ? EXAM: ?CT ABDOMEN AND PELVIS WITHOUT CONTRAST ? ?IMPRESSION: ?1. Perforating descending colonic diverticulitis with multiple ?abdominopelvic gas and fluid collections, measuring up to 5.7 cm and further described above. ?2. Cholelithiasis without findings of acute cholecystitis. ?3. 3.6 cm benign left adrenal adenoma. ?4. Leiomyomatous uterus. ?5. 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 ?  ?11/07/2020 Imaging  ? EXAM: ?CT ABDOMEN AND PELVIS WITHOUT CONTRAST ? ?IMPRESSION: ?Continued wall thickening is seen involving descending colon ?suggesting infectious or inflammatory colitis or perforated ?diverticulitis. There is an adjacent fluid collection measuring 7.0 ?x 5.0 cm consistent with abscess which is significantly enlarged ?compared to prior exam. Adjacent to the abscess, there is a severely thickened small bowel loop most consistent with secondary inflammation. ?  ?5.2 x 3.5 cm fluid collection is noted within the left psoas muscle ?consistent with abscess which is significantly enlarged compared to prior exam. 4.4 x 3.8 cm fluid collection consistent with abscess is noted in the left retroperitoneal region which is also enlarged ?compared to prior exam. ?  ?Fibroid uterus. ?  ?Cholelithiasis. ?  ?11/27/2020 Imaging  ? EXAM: ?CT ABDOMEN AND PELVIS WITHOUT CONTRAST ? ?IMPRESSION: ?1. Significantly interval decreased size of the previously ?visualized left retroperitoneal and left anterior mid abdominal ?fluid collections. There is persistent fat stranding and mural ?thickening of the mid descending colon at this level. ?2. Unchanged leiomyomatous uterus. ?3. Unchanged cholelithiasis. ?  ?12/11/2020 Imaging  ? EXAM: ?CT ABDOMEN AND PELVIS WITHOUT CONTRAST ? ?IMPRESSION: ?1. Stable inflammatory changes centered around the distal descending colon in the left lower abdomen. Pericolonic inflammatory changes have minimally changed since 11/27/2020. Small pocket of gas medial to the colon is probably associated with a fistula or small residual abscess collection. ?2. Stable position of the two percutaneous drains. The more anterior drain  may be extending through a portion of the small bowel. ?3. Cholelithiasis. ?4. Fibroid uterus. Cannot exclude an ovarian/adnexal cystic ?structure near the uterine  fundus. ?5. Left adrenal adenoma. ?  ?01/07/2021 Imaging  ? EXAM: ?CT ABDOMEN AND PELVIS WITH CONTRAST ? ?IMPRESSION: ?1. No new abscesses identified. Similar degree of soft tissue ?thickening seen in the left pericolonic regio

## 2021-08-31 NOTE — Progress Notes (Signed)
Flush nurse reported no blood return on patients PICC line today and patient also refused to give urine specimen as well, stated that she is dry and unable to give specimen.   Per provider no lab appointment needed patient may go to scheduling and then home.  ?

## 2021-08-31 NOTE — Progress Notes (Signed)
DISCONTINUE ON PATHWAY REGIMEN - Colorectal ? ? ?  A cycle is every 14 days: ?    Oxaliplatin  ?    Leucovorin  ?    Fluorouracil  ?    Fluorouracil  ? ?**Always confirm dose/schedule in your pharmacy ordering system** ? ?REASON: Disease Progression ?PRIOR TREATMENT: COS67: mFOLFOX6 q14 Days x 6 Months ?TREATMENT RESPONSE: Progressive Disease (PD) ? ?START ON PATHWAY REGIMEN - Colorectal ? ? ?  A cycle is every 21 days: ?    Pembrolizumab  ? ?**Always confirm dose/schedule in your pharmacy ordering system** ? ?Patient Characteristics: ?Distant Metastases, Nonsurgical Candidate, KRAS/NRAS Mutation Positive/Unknown (BRAF V600 Wild-Type/Unknown), Immunotherapy, MSI-H/dMMR ?Tumor Location: Colon ?Therapeutic Status: Distant Metastases ?Microsatellite/Mismatch Repair Status: MSI-H/dMMR ?BRAF Mutation Status: Wild-Type (no mutation) ?KRAS/NRAS Mutation Status: Mutation Positive ?Intent of Therapy: ?Non-Curative / Palliative Intent, Discussed with Patient ?

## 2021-09-02 ENCOUNTER — Telehealth: Payer: Self-pay | Admitting: Hematology

## 2021-09-02 ENCOUNTER — Inpatient Hospital Stay: Payer: BC Managed Care – PPO

## 2021-09-02 ENCOUNTER — Inpatient Hospital Stay: Payer: BC Managed Care – PPO | Admitting: Nurse Practitioner

## 2021-09-02 NOTE — Telephone Encounter (Signed)
Rescheduled upcoming appointments per 3/29 secure chat. Patient is aware of changes. ?

## 2021-09-02 NOTE — Telephone Encounter (Signed)
Scheduled follow-up appointments per 3/27 los. Patient is aware. ?

## 2021-09-03 ENCOUNTER — Ambulatory Visit: Payer: BC Managed Care – PPO

## 2021-09-03 ENCOUNTER — Encounter: Payer: BC Managed Care – PPO | Admitting: Nurse Practitioner

## 2021-09-03 ENCOUNTER — Other Ambulatory Visit: Payer: BC Managed Care – PPO

## 2021-09-04 DIAGNOSIS — E440 Moderate protein-calorie malnutrition: Secondary | ICD-10-CM | POA: Diagnosis not present

## 2021-09-04 DIAGNOSIS — D62 Acute posthemorrhagic anemia: Secondary | ICD-10-CM | POA: Diagnosis not present

## 2021-09-04 DIAGNOSIS — Z483 Aftercare following surgery for neoplasm: Secondary | ICD-10-CM | POA: Diagnosis not present

## 2021-09-04 DIAGNOSIS — Z6837 Body mass index (BMI) 37.0-37.9, adult: Secondary | ICD-10-CM | POA: Diagnosis not present

## 2021-09-04 DIAGNOSIS — G4730 Sleep apnea, unspecified: Secondary | ICD-10-CM | POA: Diagnosis not present

## 2021-09-04 DIAGNOSIS — Z791 Long term (current) use of non-steroidal anti-inflammatories (NSAID): Secondary | ICD-10-CM | POA: Diagnosis not present

## 2021-09-04 DIAGNOSIS — Z4801 Encounter for change or removal of surgical wound dressing: Secondary | ICD-10-CM | POA: Diagnosis not present

## 2021-09-04 DIAGNOSIS — I10 Essential (primary) hypertension: Secondary | ICD-10-CM | POA: Diagnosis not present

## 2021-09-04 DIAGNOSIS — E876 Hypokalemia: Secondary | ICD-10-CM | POA: Diagnosis not present

## 2021-09-04 DIAGNOSIS — Z79891 Long term (current) use of opiate analgesic: Secondary | ICD-10-CM | POA: Diagnosis not present

## 2021-09-04 DIAGNOSIS — K5792 Diverticulitis of intestine, part unspecified, without perforation or abscess without bleeding: Secondary | ICD-10-CM | POA: Diagnosis not present

## 2021-09-04 DIAGNOSIS — Z9049 Acquired absence of other specified parts of digestive tract: Secondary | ICD-10-CM | POA: Diagnosis not present

## 2021-09-04 DIAGNOSIS — Z452 Encounter for adjustment and management of vascular access device: Secondary | ICD-10-CM | POA: Diagnosis not present

## 2021-09-04 DIAGNOSIS — E669 Obesity, unspecified: Secondary | ICD-10-CM | POA: Diagnosis not present

## 2021-09-04 DIAGNOSIS — C189 Malignant neoplasm of colon, unspecified: Secondary | ICD-10-CM | POA: Diagnosis not present

## 2021-09-04 DIAGNOSIS — Z433 Encounter for attention to colostomy: Secondary | ICD-10-CM | POA: Diagnosis not present

## 2021-09-08 ENCOUNTER — Encounter: Payer: Self-pay | Admitting: Nurse Practitioner

## 2021-09-08 ENCOUNTER — Inpatient Hospital Stay (HOSPITAL_BASED_OUTPATIENT_CLINIC_OR_DEPARTMENT_OTHER): Payer: BC Managed Care – PPO | Admitting: Nurse Practitioner

## 2021-09-08 ENCOUNTER — Other Ambulatory Visit: Payer: Self-pay

## 2021-09-08 ENCOUNTER — Inpatient Hospital Stay: Payer: BC Managed Care – PPO | Attending: Hematology

## 2021-09-08 ENCOUNTER — Inpatient Hospital Stay: Payer: BC Managed Care – PPO

## 2021-09-08 VITALS — BP 132/88 | HR 100 | Temp 98.3°F | Resp 18 | Wt 173.5 lb

## 2021-09-08 DIAGNOSIS — Z7189 Other specified counseling: Secondary | ICD-10-CM

## 2021-09-08 DIAGNOSIS — Z515 Encounter for palliative care: Secondary | ICD-10-CM

## 2021-09-08 DIAGNOSIS — C187 Malignant neoplasm of sigmoid colon: Secondary | ICD-10-CM

## 2021-09-08 DIAGNOSIS — E669 Obesity, unspecified: Secondary | ICD-10-CM | POA: Diagnosis not present

## 2021-09-08 DIAGNOSIS — Z800 Family history of malignant neoplasm of digestive organs: Secondary | ICD-10-CM | POA: Insufficient documentation

## 2021-09-08 DIAGNOSIS — R110 Nausea: Secondary | ICD-10-CM | POA: Insufficient documentation

## 2021-09-08 DIAGNOSIS — Z452 Encounter for adjustment and management of vascular access device: Secondary | ICD-10-CM

## 2021-09-08 DIAGNOSIS — Z803 Family history of malignant neoplasm of breast: Secondary | ICD-10-CM | POA: Insufficient documentation

## 2021-09-08 DIAGNOSIS — R630 Anorexia: Secondary | ICD-10-CM

## 2021-09-08 DIAGNOSIS — G893 Neoplasm related pain (acute) (chronic): Secondary | ICD-10-CM | POA: Diagnosis not present

## 2021-09-08 DIAGNOSIS — R634 Abnormal weight loss: Secondary | ICD-10-CM

## 2021-09-08 DIAGNOSIS — D649 Anemia, unspecified: Secondary | ICD-10-CM | POA: Diagnosis not present

## 2021-09-08 DIAGNOSIS — R531 Weakness: Secondary | ICD-10-CM | POA: Diagnosis not present

## 2021-09-08 DIAGNOSIS — Z79899 Other long term (current) drug therapy: Secondary | ICD-10-CM | POA: Diagnosis not present

## 2021-09-08 DIAGNOSIS — R530 Neoplastic (malignant) related fatigue: Secondary | ICD-10-CM

## 2021-09-08 DIAGNOSIS — D696 Thrombocytopenia, unspecified: Secondary | ICD-10-CM | POA: Insufficient documentation

## 2021-09-08 DIAGNOSIS — I10 Essential (primary) hypertension: Secondary | ICD-10-CM | POA: Insufficient documentation

## 2021-09-08 DIAGNOSIS — R6510 Systemic inflammatory response syndrome (SIRS) of non-infectious origin without acute organ dysfunction: Secondary | ICD-10-CM | POA: Insufficient documentation

## 2021-09-08 DIAGNOSIS — K5903 Drug induced constipation: Secondary | ICD-10-CM

## 2021-09-08 DIAGNOSIS — Z5111 Encounter for antineoplastic chemotherapy: Secondary | ICD-10-CM | POA: Insufficient documentation

## 2021-09-08 DIAGNOSIS — K5900 Constipation, unspecified: Secondary | ICD-10-CM | POA: Insufficient documentation

## 2021-09-08 DIAGNOSIS — G4730 Sleep apnea, unspecified: Secondary | ICD-10-CM | POA: Insufficient documentation

## 2021-09-08 DIAGNOSIS — Z95828 Presence of other vascular implants and grafts: Secondary | ICD-10-CM

## 2021-09-08 LAB — CBC WITH DIFFERENTIAL (CANCER CENTER ONLY)
Abs Immature Granulocytes: 0.56 10*3/uL — ABNORMAL HIGH (ref 0.00–0.07)
Basophils Absolute: 0 10*3/uL (ref 0.0–0.1)
Basophils Relative: 0 %
Eosinophils Absolute: 0 10*3/uL (ref 0.0–0.5)
Eosinophils Relative: 0 %
HCT: 28.3 % — ABNORMAL LOW (ref 36.0–46.0)
Hemoglobin: 8.5 g/dL — ABNORMAL LOW (ref 12.0–15.0)
Immature Granulocytes: 4 %
Lymphocytes Relative: 17 %
Lymphs Abs: 2.6 10*3/uL (ref 0.7–4.0)
MCH: 26 pg (ref 26.0–34.0)
MCHC: 30 g/dL (ref 30.0–36.0)
MCV: 86.5 fL (ref 80.0–100.0)
Monocytes Absolute: 1.1 10*3/uL — ABNORMAL HIGH (ref 0.1–1.0)
Monocytes Relative: 7 %
Neutro Abs: 11.5 10*3/uL — ABNORMAL HIGH (ref 1.7–7.7)
Neutrophils Relative %: 72 %
Platelet Count: 818 10*3/uL — ABNORMAL HIGH (ref 150–400)
RBC: 3.27 MIL/uL — ABNORMAL LOW (ref 3.87–5.11)
RDW: 18.6 % — ABNORMAL HIGH (ref 11.5–15.5)
WBC Count: 15.9 10*3/uL — ABNORMAL HIGH (ref 4.0–10.5)
nRBC: 0.9 % — ABNORMAL HIGH (ref 0.0–0.2)

## 2021-09-08 LAB — CMP (CANCER CENTER ONLY)
ALT: 11 U/L (ref 0–44)
AST: 20 U/L (ref 15–41)
Albumin: 2.7 g/dL — ABNORMAL LOW (ref 3.5–5.0)
Alkaline Phosphatase: 155 U/L — ABNORMAL HIGH (ref 38–126)
Anion gap: 8 (ref 5–15)
BUN: 18 mg/dL (ref 6–20)
CO2: 34 mmol/L — ABNORMAL HIGH (ref 22–32)
Calcium: 8.2 mg/dL — ABNORMAL LOW (ref 8.9–10.3)
Chloride: 101 mmol/L (ref 98–111)
Creatinine: 0.49 mg/dL (ref 0.44–1.00)
GFR, Estimated: >60 mL/min (ref >60)
Glucose, Bld: 92 mg/dL (ref 70–99)
Potassium: 2.8 mmol/L — ABNORMAL LOW (ref 3.5–5.1)
Sodium: 143 mmol/L (ref 135–145)
Total Bilirubin: 0.3 mg/dL (ref 0.3–1.2)
Total Protein: 6.5 g/dL (ref 6.5–8.1)

## 2021-09-08 LAB — IRON AND IRON BINDING CAPACITY (CC-WL,HP ONLY)
Iron: 28 ug/dL (ref 28–170)
Saturation Ratios: 21 % (ref 10.4–31.8)
TIBC: 132 ug/dL — ABNORMAL LOW (ref 250–450)
UIBC: 104 ug/dL — ABNORMAL LOW (ref 148–442)

## 2021-09-08 MED ORDER — SODIUM CHLORIDE 0.9 % IV SOLN: Freq: Once | INTRAVENOUS | Status: AC

## 2021-09-08 MED ORDER — SODIUM CHLORIDE 0.9% FLUSH: 10.0000 mL | Freq: Once | INTRAVENOUS | Status: DC

## 2021-09-08 MED ORDER — SODIUM CHLORIDE 0.9 % IV SOLN
200.0000 mg | Freq: Once | INTRAVENOUS | Status: AC
  Administered 2021-09-08: 200 mg via INTRAVENOUS
  Filled 2021-09-08: qty 200

## 2021-09-08 MED ORDER — HYDROMORPHONE HCL 2 MG PO TABS
2.0000 mg | ORAL_TABLET | ORAL | 0 refills | Status: DC | PRN
Start: 2021-09-08 — End: 2021-09-23

## 2021-09-08 NOTE — Progress Notes (Signed)
Pt's potassium 2.8 today. RN verified that Pt is taking PO potassium at home. Pt stated "I have trouble taking them sometimes, they are big". ?RN educated pt on ways to make taking them easier and the importance of taking the PO potassium.  ?Pt verbalized understanding.  ?

## 2021-09-08 NOTE — Patient Instructions (Signed)
[facility]  Discharge Instructions: ?Thank you for choosing [facility] to provide your oncology and hematology care.  ? ?If you have a lab appointment with the Cancer Center, please go directly to the Cancer Center and check in at the registration area. ?  ?Wear comfortable clothing and clothing appropriate for easy access to any Portacath or PICC line.  ? ?We strive to give you quality time with your provider. You may need to reschedule your appointment if you arrive late (15 or more minutes).  Arriving late affects you and other patients whose appointments are after yours.  Also, if you miss three or more appointments without notifying the office, you may be dismissed from the clinic at the provider?s discretion.    ?  ?For prescription refill requests, have your pharmacy contact our office and allow 72 hours for refills to be completed.   ? ?Today you received the following chemotherapy and/or immunotherapy agents: Keytruda ?  ?To help prevent nausea and vomiting after your treatment, we encourage you to take your nausea medication as directed. ? ?BELOW ARE SYMPTOMS THAT SHOULD BE REPORTED IMMEDIATELY: ?*FEVER GREATER THAN 100.4 F (38 ?C) OR HIGHER ?*CHILLS OR SWEATING ?*NAUSEA AND VOMITING THAT IS NOT CONTROLLED WITH YOUR NAUSEA MEDICATION ?*UNUSUAL SHORTNESS OF BREATH ?*UNUSUAL BRUISING OR BLEEDING ?*URINARY PROBLEMS (pain or burning when urinating, or frequent urination) ?*BOWEL PROBLEMS (unusual diarrhea, constipation, pain near the anus) ?TENDERNESS IN MOUTH AND THROAT WITH OR WITHOUT PRESENCE OF ULCERS (sore throat, sores in mouth, or a toothache) ?UNUSUAL RASH, SWELLING OR PAIN  ?UNUSUAL VAGINAL DISCHARGE OR ITCHING  ? ?Items with * indicate a potential emergency and should be followed up as soon as possible or go to the Emergency Department if any problems should occur. ? ?Please show the CHEMOTHERAPY ALERT CARD or IMMUNOTHERAPY ALERT CARD at check-in to the  Emergency Department and triage nurse. ? ?Should you have questions after your visit or need to cancel or reschedule your appointment, please contact [facility]  Dept: [phone]  and follow the prompts.  Office hours are 8:00 a.m. to 4:30 p.m. Monday - Friday. Please note that voicemails left after 4:00 p.m. may not be returned until the following business day.  We are closed weekends and major holidays. You have access to a nurse at all times for urgent questions. Please call the main number to the clinic Dept: [phone] and follow the prompts. ? ? ?For any non-urgent questions, you may also contact your provider using MyChart. We now offer e-Visits for anyone 18 and older to request care online for non-urgent symptoms. For details visit [email or web address]. ?  ?Also download the MyChart app! Go to the app store, search "MyChart", open the app, select [facility], and log in with your MyChart username and password. ? ?Due to Covid, a mask is required upon entering the hospital/clinic. If you do not have a mask, one will be given to you upon arrival. For doctor visits, patients may have 1 support person aged 18 or older with them. For treatment visits, patients cannot have anyone with them due to current Covid guidelines and our immunocompromised population.  ? ?

## 2021-09-08 NOTE — Progress Notes (Signed)
Patient had a PICC line flush and lab appt today along with treatment.  Per patients most recent CT scanon 3/24, the tip is positioned at the innominate vein confluence which is not appropriate for treatment.  Patient is having labs and treatment peripherally today.  Patient verbalized understanding.  ?

## 2021-09-09 ENCOUNTER — Encounter: Payer: Self-pay | Admitting: Hematology

## 2021-09-09 ENCOUNTER — Other Ambulatory Visit: Payer: Self-pay

## 2021-09-09 DIAGNOSIS — N390 Urinary tract infection, site not specified: Secondary | ICD-10-CM

## 2021-09-09 DIAGNOSIS — C187 Malignant neoplasm of sigmoid colon: Secondary | ICD-10-CM

## 2021-09-09 LAB — TSH: TSH: 2.645 u[IU]/mL (ref 0.308–3.960)

## 2021-09-09 LAB — CEA (IN HOUSE-CHCC): CEA (CHCC-In House): 130.36 ng/mL — ABNORMAL HIGH (ref 0.00–5.00)

## 2021-09-09 LAB — FERRITIN: Ferritin: 207 ng/mL (ref 11–307)

## 2021-09-09 LAB — T4: T4, Total: 5.9 ug/dL (ref 4.5–12.0)

## 2021-09-09 MED ORDER — POTASSIUM CHLORIDE CRYS ER 20 MEQ PO TBCR: 20.0000 meq | EXTENDED_RELEASE_TABLET | Freq: Two times a day (BID) | ORAL | 1 refills | Status: DC

## 2021-09-09 NOTE — Progress Notes (Signed)
Please let Tito Dine know that her K remains to be very low, please encourage her to take KCL, change to 1 tab three times a day for 5 days then twice daily, thanks  ? ?[name]  ?09/09/2021

## 2021-09-09 NOTE — Progress Notes (Signed)
? ?  ?Palliative Medicine ?[facility]  ?Telephone:([phone] [phone] ? ? ?Name: Mikayla Rios ?Date: 09/09/2021 ?MRN: [id number]  ?DOB: [birth year] ? ?Patient Care Team: ?[name], MD as PCP - General (Internal Medicine) ?[name], MD as PCP - Cardiology (Cardiology) ?[name], [name] as Librarian, academic (Emergency Medicine) ?[name], MD as Consulting Physician (Oncology) ?Earl Gala, Deliah Goody, RN as Sales executive (Oncology) ?[name], MD as Consulting Physician (General Surgery) ?[name], [name], MD as Consulting Physician (Neurology) ?Cheri Fowler, MD as Consulting Physician (Obstetrics and Gynecology) ?[name], MD as Consulting Physician (Gastroenterology) ?[name], [name], NP as Nurse Practitioner (Nurse Practitioner)  ? ? ?INTERVAL HISTORY: ?Mikayla Rios is a 36 y.o. 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 Recent CT scan on 06/16/21 showed progressive metastatic disease. Palliative ask to see for symptom management. ?  ? ?SOCIAL HISTORY:    ? reports that she has never smoked. She has never used smokeless tobacco. She reports that she does not currently use alcohol. She reports that she does not use drugs. ? ?ADVANCE DIRECTIVES:  ? ? ?CODE STATUS:  ? ?PAST MEDICAL HISTORY: ?Past Medical History:  ?Diagnosis Date  ? Allergy   ? seasonal  ? Anemia   ? Blood infection 1985  ? Blood transfusion without reported diagnosis   ? Diverticulitis   ? Family history of breast cancer   ? Family history of colon cancer   ? Family history of stomach cancer   ? Hypertension   ? Obesity   ? Sleep apnea   ? ? ?ALLERGIES:  is allergic to chlorhexidine gluconate, shellfish allergy, and penicillins. ? ?MEDICATIONS:  ?Current Outpatient Medications  ?Medication Sig Dispense Refill  ? HYDROmorphone (DILAUDID) 2 MG  tablet Take 1 tablet (2 mg total) by mouth every 4 (four) hours as needed for severe pain or moderate pain. 90 tablet 0  ? amLODipine (NORVASC) 10 MG tablet Take 10 mg by mouth daily.    ? dexamethasone (DECADRON) 4 MG tablet Take 1 tablet (4 mg total) by mouth daily. Take for 3-5 day after chemo (Patient not taking: Reported on 08/12/2021) 20 tablet 0  ? diclofenac Sodium (VOLTAREN) 1 % GEL Apply 4 g topically 4 (four) times daily as needed (for mild pain). 4 g 0  ? DULoxetine (CYMBALTA) 30 MG capsule Take 1 capsule (30 mg total) by mouth daily. 60 capsule 1  ? gabapentin (NEURONTIN) 300 MG capsule Take 1 capsule (300 mg) by mouth twice daily and Take 2 capsules ('600mg'$ ) by mouth at bedtime. 120 capsule 0  ? heparin lock flush 100 UNIT/ML SOLN injection Use 1 syringe (into each lumen) twice daily 600 mL 0  ? metoprolol tartrate (LOPRESSOR) 50 MG tablet Take 1 tablet (50 mg total) by mouth 2 (two) times daily. 180 tablet 2  ? morphine (MS CONTIN) 30 MG 12 hr tablet Take 1 tablet by mouth every 8 hours. 60 tablet 0  ? potassium chloride SA (KLOR-CON M20) 20 MEQ tablet Take 1 tablet (20 mEq total) by mouth 2 (two) times daily for 65 doses. Take 1 tab (20 mEq total) by mouth 3 (three) times daily for 5 days and the Take 1 tab (20 mEq total) by mouth 2 (two) times daily for 25 days. 65 tablet 1  ? Sodium Chloride Flush (NORMAL SALINE FLUSH) 0.9 % SOLN Inject 10 mLs (1 syringe) into  lumen twice daily as directed 1200 mL 2  ? ?No current facility-administered medications for this visit.  ? ? ?VITAL SIGNS: ?There were no vitals taken for this visit. ?There were no vitals filed for this visit. ? ?  ?Estimated body mass index is 30.73 kg/m? as calculated from the following: ?  Height as of 08/12/21: '5\' 3"'$  (1.6 m). ?  Weight as of an earlier encounter on 09/08/21: 173 lb 8 oz (78.7 kg). ? ? ?PERFORMANCE STATUS (ECOG) : 1 - Symptomatic but completely ambulatory ? ? ?Physical Exam ?General: NAD, weak appearing   ?Cardiovascular:RRR ?Abdomen: soft, tender, + bowel sounds, colostomy in place ?Neurological: AAO x4, mood appropriate ? ?IMPRESSION: ? ?Mrs. Virnig presents to clinic today for symptom management follow-up. She is starting first treatment of Keytruda on today due to recent progression as shown on scans. No acute distress noted. Her husband is with her. She is in a wheelchair due to some fatigue when walking long distances.  ? ?Neoplams related pain ?Jayliana reports her pain has escalated over the past 1-2 weeks. Reports Oxy IR 10 mg provides some relief however pain escalates in about 2.5-3 hrs after taking. She is taking as prescribed as she knows if she takes more often she will run out sooner and unable to get a refill. Pain does decrease when taking but does not completely resolve. She is tolerating regimen of MS Contin 30 mg three times daily. Reports noticeable difference when she tries to do more around the house. Her body will generally tell her she is doing to much and pain is escalated. Encouraged her to listen to her body and take it easy. It is ok to take rest breaks or not do everything all at one time. She and husband verbalized understanding.  ? ?We will adjust breakthrough medication and transition her to hydromorphone and discontinue Oxy IR with hopes of better control with breakthrough medication.  She verbalizes understanding and appreciation.  Her pain continues to worsen will consider changes to long-acting at that time.  We will plan to closely monitor. ? ?Anxiety  ?Continues to focus on her health and life one day at at time.  She is concerned about her recent progression as her family is her main priority and concern.  Emotional support provided.  She is trying to remain focused on the positives, time with family, and remaining hopeful that new treatment will offer a better response. She has a strong spiritual foundation. She is being followed by Dr. Nelida Meuse which she is appreciative of.   ? ?3. Constipation ?Constipation improved with Miralax.  ? ?4. Nausea/decreased appetite  ?Jasmine does have an appetite however reports nausea around meal times or after eating which impacts her appetite and the amount she eats. Compazine is helpful. Also has zofran available. Emphasized taking antiemetics prior to meals to aid in some relief. Also decrease sudden exposures to highly aromatized foods (which tends to be hot meals). Allowing time to sit on table before eating or sitting in front of food. Encouraged small frequent meals versus trying to eat 3 large meals a day.  ? ?She is drinking Ensure 1-2 times daily and tolerating.  ? ?Her current weight is 173 lbs down from 177lbs on 3/15, 179 lbs on 3/13, 182 lbs on 2/27. We discussed appetite stimulant but with plans to closely monitor before starting. Hopefully once nausea is more controlled around meals this will have an impact on her intake.  She would like to continue to assess  and reevaluate at follow-up visit if necessary. ? ?I discussed the importance of continued conversation with family and their medical providers regarding overall plan of care and treatment options, ensuring decisions are within the context of the patients values and GOCs. ? ?PLAN: ?We will discontinue Oxy IR and transition to hydromorphone 2 mg every 4-6 hours as needed for breakthrough pain ?Tolreating MS Contin 30 mg to every 8 hours.  ?Gabapentin '600mg'$  3 times daily ?Cymbalta 30 mg daily ?Education on increased protein intake and nutrition.  ?Miralax daily for constipation ?I will plan to see her back in 2-3 weeks in collaboration with future appointments.  ? ? ?Patient expressed understanding and was in agreement with this plan. She also understands that She can call the clinic at any time with any questions, concerns, or complaints.  ? ?Time Total: 35 min ? ?Visit consisted of counseling and education dealing with the complex and emotionally intense issues of symptom management  and palliative care in the setting of serious and potentially life-threatening illness.Greater than 50%  of this time was spent counseling and coordinating care related to the above assessment and plan. ? ?[employer]

## 2021-09-10 ENCOUNTER — Other Ambulatory Visit: Payer: Self-pay | Admitting: Nurse Practitioner

## 2021-09-10 ENCOUNTER — Inpatient Hospital Stay: Payer: BC Managed Care – PPO

## 2021-09-10 ENCOUNTER — Other Ambulatory Visit: Payer: Self-pay

## 2021-09-10 DIAGNOSIS — I10 Essential (primary) hypertension: Secondary | ICD-10-CM | POA: Diagnosis not present

## 2021-09-10 DIAGNOSIS — Z433 Encounter for attention to colostomy: Secondary | ICD-10-CM | POA: Diagnosis not present

## 2021-09-10 DIAGNOSIS — R531 Weakness: Secondary | ICD-10-CM | POA: Diagnosis not present

## 2021-09-10 DIAGNOSIS — E440 Moderate protein-calorie malnutrition: Secondary | ICD-10-CM | POA: Diagnosis not present

## 2021-09-10 DIAGNOSIS — G893 Neoplasm related pain (acute) (chronic): Secondary | ICD-10-CM | POA: Diagnosis not present

## 2021-09-10 DIAGNOSIS — Z791 Long term (current) use of non-steroidal anti-inflammatories (NSAID): Secondary | ICD-10-CM | POA: Diagnosis not present

## 2021-09-10 DIAGNOSIS — C189 Malignant neoplasm of colon, unspecified: Secondary | ICD-10-CM | POA: Diagnosis not present

## 2021-09-10 DIAGNOSIS — Z483 Aftercare following surgery for neoplasm: Secondary | ICD-10-CM | POA: Diagnosis not present

## 2021-09-10 DIAGNOSIS — Z452 Encounter for adjustment and management of vascular access device: Secondary | ICD-10-CM | POA: Diagnosis not present

## 2021-09-10 DIAGNOSIS — K5900 Constipation, unspecified: Secondary | ICD-10-CM | POA: Diagnosis not present

## 2021-09-10 DIAGNOSIS — R6510 Systemic inflammatory response syndrome (SIRS) of non-infectious origin without acute organ dysfunction: Secondary | ICD-10-CM | POA: Diagnosis not present

## 2021-09-10 DIAGNOSIS — R110 Nausea: Secondary | ICD-10-CM | POA: Diagnosis not present

## 2021-09-10 DIAGNOSIS — K5792 Diverticulitis of intestine, part unspecified, without perforation or abscess without bleeding: Secondary | ICD-10-CM | POA: Diagnosis not present

## 2021-09-10 DIAGNOSIS — G4730 Sleep apnea, unspecified: Secondary | ICD-10-CM | POA: Diagnosis not present

## 2021-09-10 DIAGNOSIS — Z800 Family history of malignant neoplasm of digestive organs: Secondary | ICD-10-CM | POA: Diagnosis not present

## 2021-09-10 DIAGNOSIS — Z79891 Long term (current) use of opiate analgesic: Secondary | ICD-10-CM | POA: Diagnosis not present

## 2021-09-10 DIAGNOSIS — E669 Obesity, unspecified: Secondary | ICD-10-CM | POA: Diagnosis not present

## 2021-09-10 DIAGNOSIS — D62 Acute posthemorrhagic anemia: Secondary | ICD-10-CM | POA: Diagnosis not present

## 2021-09-10 DIAGNOSIS — N390 Urinary tract infection, site not specified: Secondary | ICD-10-CM

## 2021-09-10 DIAGNOSIS — Z6837 Body mass index (BMI) 37.0-37.9, adult: Secondary | ICD-10-CM | POA: Diagnosis not present

## 2021-09-10 DIAGNOSIS — C187 Malignant neoplasm of sigmoid colon: Secondary | ICD-10-CM

## 2021-09-10 DIAGNOSIS — D649 Anemia, unspecified: Secondary | ICD-10-CM | POA: Diagnosis not present

## 2021-09-10 DIAGNOSIS — D696 Thrombocytopenia, unspecified: Secondary | ICD-10-CM | POA: Diagnosis not present

## 2021-09-10 DIAGNOSIS — Z79899 Other long term (current) drug therapy: Secondary | ICD-10-CM | POA: Diagnosis not present

## 2021-09-10 DIAGNOSIS — Z9049 Acquired absence of other specified parts of digestive tract: Secondary | ICD-10-CM | POA: Diagnosis not present

## 2021-09-10 DIAGNOSIS — Z5111 Encounter for antineoplastic chemotherapy: Secondary | ICD-10-CM | POA: Diagnosis not present

## 2021-09-10 DIAGNOSIS — E876 Hypokalemia: Secondary | ICD-10-CM | POA: Diagnosis not present

## 2021-09-10 DIAGNOSIS — Z4801 Encounter for change or removal of surgical wound dressing: Secondary | ICD-10-CM | POA: Diagnosis not present

## 2021-09-10 DIAGNOSIS — Z803 Family history of malignant neoplasm of breast: Secondary | ICD-10-CM | POA: Diagnosis not present

## 2021-09-10 LAB — URINALYSIS, COMPLETE (UACMP) WITH MICROSCOPIC
Bacteria, UA: NONE SEEN
Bilirubin Urine: NEGATIVE
Glucose, UA: NEGATIVE mg/dL
Hgb urine dipstick: NEGATIVE
Ketones, ur: NEGATIVE mg/dL
Leukocytes,Ua: NEGATIVE
Nitrite: NEGATIVE
Protein, ur: NEGATIVE mg/dL
Specific Gravity, Urine: 1.024 (ref 1.005–1.030)
pH: 6 (ref 5.0–8.0)

## 2021-09-11 ENCOUNTER — Other Ambulatory Visit (HOSPITAL_COMMUNITY): Payer: Self-pay

## 2021-09-12 LAB — URINE CULTURE

## 2021-09-14 ENCOUNTER — Other Ambulatory Visit: Payer: BC Managed Care – PPO

## 2021-09-14 ENCOUNTER — Ambulatory Visit: Payer: BC Managed Care – PPO | Admitting: Hematology

## 2021-09-14 ENCOUNTER — Other Ambulatory Visit: Payer: Self-pay | Admitting: Nurse Practitioner

## 2021-09-14 ENCOUNTER — Ambulatory Visit: Payer: BC Managed Care – PPO

## 2021-09-14 DIAGNOSIS — D649 Anemia, unspecified: Secondary | ICD-10-CM | POA: Diagnosis not present

## 2021-09-14 DIAGNOSIS — I10 Essential (primary) hypertension: Secondary | ICD-10-CM

## 2021-09-14 DIAGNOSIS — E43 Unspecified severe protein-calorie malnutrition: Secondary | ICD-10-CM

## 2021-09-14 DIAGNOSIS — Z933 Colostomy status: Secondary | ICD-10-CM

## 2021-09-14 DIAGNOSIS — C187 Malignant neoplasm of sigmoid colon: Secondary | ICD-10-CM | POA: Diagnosis not present

## 2021-09-14 DIAGNOSIS — K5720 Diverticulitis of large intestine with perforation and abscess without bleeding: Secondary | ICD-10-CM

## 2021-09-14 DIAGNOSIS — R5381 Other malaise: Secondary | ICD-10-CM | POA: Diagnosis not present

## 2021-09-14 DIAGNOSIS — Z515 Encounter for palliative care: Secondary | ICD-10-CM

## 2021-09-14 MED ORDER — GABAPENTIN 300 MG PO CAPS
600.0000 mg | ORAL_CAPSULE | Freq: Three times a day (TID) | ORAL | 1 refills | Status: DC
Start: 2021-09-14 — End: 2021-10-11

## 2021-09-14 MED ORDER — MORPHINE SULFATE ER 30 MG PO TBCR: 30.0000 mg | EXTENDED_RELEASE_TABLET | Freq: Three times a day (TID) | ORAL | 0 refills | Status: DC

## 2021-09-14 MED ORDER — DULOXETINE HCL 30 MG PO CPEP: 30.0000 mg | ORAL_CAPSULE | Freq: Every day | ORAL | 1 refills | Status: DC

## 2021-09-16 ENCOUNTER — Ambulatory Visit: Payer: BC Managed Care – PPO | Admitting: Psychologist

## 2021-09-16 DIAGNOSIS — G4730 Sleep apnea, unspecified: Secondary | ICD-10-CM | POA: Diagnosis not present

## 2021-09-16 DIAGNOSIS — I10 Essential (primary) hypertension: Secondary | ICD-10-CM | POA: Diagnosis not present

## 2021-09-16 DIAGNOSIS — E669 Obesity, unspecified: Secondary | ICD-10-CM | POA: Diagnosis not present

## 2021-09-16 DIAGNOSIS — K5792 Diverticulitis of intestine, part unspecified, without perforation or abscess without bleeding: Secondary | ICD-10-CM | POA: Diagnosis not present

## 2021-09-16 DIAGNOSIS — Z9181 History of falling: Secondary | ICD-10-CM | POA: Diagnosis not present

## 2021-09-16 DIAGNOSIS — Z791 Long term (current) use of non-steroidal anti-inflammatories (NSAID): Secondary | ICD-10-CM | POA: Diagnosis not present

## 2021-09-16 DIAGNOSIS — Z9049 Acquired absence of other specified parts of digestive tract: Secondary | ICD-10-CM | POA: Diagnosis not present

## 2021-09-16 DIAGNOSIS — Z6837 Body mass index (BMI) 37.0-37.9, adult: Secondary | ICD-10-CM | POA: Diagnosis not present

## 2021-09-16 DIAGNOSIS — E440 Moderate protein-calorie malnutrition: Secondary | ICD-10-CM | POA: Diagnosis not present

## 2021-09-16 DIAGNOSIS — Z79891 Long term (current) use of opiate analgesic: Secondary | ICD-10-CM | POA: Diagnosis not present

## 2021-09-16 DIAGNOSIS — Z483 Aftercare following surgery for neoplasm: Secondary | ICD-10-CM | POA: Diagnosis not present

## 2021-09-16 DIAGNOSIS — Z933 Colostomy status: Secondary | ICD-10-CM | POA: Diagnosis not present

## 2021-09-16 DIAGNOSIS — C189 Malignant neoplasm of colon, unspecified: Secondary | ICD-10-CM | POA: Diagnosis not present

## 2021-09-16 DIAGNOSIS — E876 Hypokalemia: Secondary | ICD-10-CM | POA: Diagnosis not present

## 2021-09-22 ENCOUNTER — Telehealth: Payer: Self-pay

## 2021-09-22 NOTE — Telephone Encounter (Signed)
Pt called regarding needing a Chest x-ray done to confirm PICC placement before her lab appointment so there is no confusion like her last visit. Pt agrees and wants to do it before lab appointment. She was told to just arrive a bit earlier and go to admitting for the x-ray then continue with appointments scheduled afterwards. Pt verbalized understanding.  ?

## 2021-09-22 NOTE — Telephone Encounter (Signed)
-----   Message from Alla Feeling, NP sent at 09/22/2021 10:33 AM EDT ----- ?Juli Odom or [place],  ?Could you please let Mikayla Rios know the recommendation is to repeat chest xray to confirm PICC line placement, due to the confusion at her last appointment. I have placed an order. She can do it first thing in the morning 4/25 before lab and the rest of her appts that day. If she would prefer to come in before, she is welcome to do that too, either way is fine. ? ?Thanks, ?Lacie  ? ?

## 2021-09-23 ENCOUNTER — Other Ambulatory Visit: Payer: Self-pay | Admitting: Nurse Practitioner

## 2021-09-23 MED ORDER — HYDROMORPHONE HCL 2 MG PO TABS: 2.0000 mg | ORAL_TABLET | ORAL | 0 refills | Status: DC | PRN

## 2021-09-24 ENCOUNTER — Other Ambulatory Visit: Payer: BC Managed Care – PPO

## 2021-09-24 ENCOUNTER — Ambulatory Visit: Payer: BC Managed Care – PPO

## 2021-09-24 ENCOUNTER — Ambulatory Visit: Payer: BC Managed Care – PPO | Admitting: Adult Health

## 2021-09-25 ENCOUNTER — Ambulatory Visit: Payer: BC Managed Care – PPO | Admitting: Hematology

## 2021-09-25 ENCOUNTER — Ambulatory Visit: Payer: BC Managed Care – PPO

## 2021-09-25 ENCOUNTER — Other Ambulatory Visit: Payer: BC Managed Care – PPO

## 2021-09-25 DIAGNOSIS — Z933 Colostomy status: Secondary | ICD-10-CM | POA: Diagnosis not present

## 2021-09-25 DIAGNOSIS — I10 Essential (primary) hypertension: Secondary | ICD-10-CM | POA: Diagnosis not present

## 2021-09-25 DIAGNOSIS — E440 Moderate protein-calorie malnutrition: Secondary | ICD-10-CM | POA: Diagnosis not present

## 2021-09-25 DIAGNOSIS — Z791 Long term (current) use of non-steroidal anti-inflammatories (NSAID): Secondary | ICD-10-CM | POA: Diagnosis not present

## 2021-09-25 DIAGNOSIS — K5792 Diverticulitis of intestine, part unspecified, without perforation or abscess without bleeding: Secondary | ICD-10-CM | POA: Diagnosis not present

## 2021-09-25 DIAGNOSIS — Z79891 Long term (current) use of opiate analgesic: Secondary | ICD-10-CM | POA: Diagnosis not present

## 2021-09-25 DIAGNOSIS — E876 Hypokalemia: Secondary | ICD-10-CM | POA: Diagnosis not present

## 2021-09-25 DIAGNOSIS — Z6837 Body mass index (BMI) 37.0-37.9, adult: Secondary | ICD-10-CM | POA: Diagnosis not present

## 2021-09-25 DIAGNOSIS — Z483 Aftercare following surgery for neoplasm: Secondary | ICD-10-CM | POA: Diagnosis not present

## 2021-09-25 DIAGNOSIS — Z9181 History of falling: Secondary | ICD-10-CM | POA: Diagnosis not present

## 2021-09-25 DIAGNOSIS — E669 Obesity, unspecified: Secondary | ICD-10-CM | POA: Diagnosis not present

## 2021-09-25 DIAGNOSIS — C189 Malignant neoplasm of colon, unspecified: Secondary | ICD-10-CM | POA: Diagnosis not present

## 2021-09-25 DIAGNOSIS — G4730 Sleep apnea, unspecified: Secondary | ICD-10-CM | POA: Diagnosis not present

## 2021-09-25 DIAGNOSIS — Z9049 Acquired absence of other specified parts of digestive tract: Secondary | ICD-10-CM | POA: Diagnosis not present

## 2021-09-28 ENCOUNTER — Other Ambulatory Visit: Payer: Self-pay

## 2021-09-28 DIAGNOSIS — C187 Malignant neoplasm of sigmoid colon: Secondary | ICD-10-CM

## 2021-09-29 ENCOUNTER — Other Ambulatory Visit: Payer: Self-pay

## 2021-09-29 ENCOUNTER — Inpatient Hospital Stay: Payer: BC Managed Care – PPO

## 2021-09-29 ENCOUNTER — Inpatient Hospital Stay (HOSPITAL_BASED_OUTPATIENT_CLINIC_OR_DEPARTMENT_OTHER): Payer: BC Managed Care – PPO | Admitting: Hematology

## 2021-09-29 ENCOUNTER — Encounter: Payer: Self-pay | Admitting: Nurse Practitioner

## 2021-09-29 ENCOUNTER — Other Ambulatory Visit (HOSPITAL_COMMUNITY): Payer: Self-pay

## 2021-09-29 ENCOUNTER — Telehealth: Payer: Self-pay

## 2021-09-29 ENCOUNTER — Inpatient Hospital Stay (HOSPITAL_BASED_OUTPATIENT_CLINIC_OR_DEPARTMENT_OTHER): Payer: BC Managed Care – PPO | Admitting: Nurse Practitioner

## 2021-09-29 ENCOUNTER — Encounter: Payer: Self-pay | Admitting: Hematology

## 2021-09-29 ENCOUNTER — Ambulatory Visit (HOSPITAL_COMMUNITY)
Admission: RE | Admit: 2021-09-29 | Discharge: 2021-09-29 | Disposition: A | Discharge status: Home / Self Care | Payer: BC Managed Care – PPO | Source: Ambulatory Visit | Attending: Nurse Practitioner | Admitting: Nurse Practitioner

## 2021-09-29 VITALS — BP 121/91 | HR 128 | Temp 98.6°F | Resp 18 | Ht 63.0 in | Wt 168.5 lb

## 2021-09-29 VITALS — HR 115

## 2021-09-29 DIAGNOSIS — Z452 Encounter for adjustment and management of vascular access device: Secondary | ICD-10-CM | POA: Diagnosis not present

## 2021-09-29 DIAGNOSIS — Z515 Encounter for palliative care: Secondary | ICD-10-CM | POA: Diagnosis not present

## 2021-09-29 DIAGNOSIS — C187 Malignant neoplasm of sigmoid colon: Secondary | ICD-10-CM

## 2021-09-29 DIAGNOSIS — Z800 Family history of malignant neoplasm of digestive organs: Secondary | ICD-10-CM | POA: Diagnosis not present

## 2021-09-29 DIAGNOSIS — R530 Neoplastic (malignant) related fatigue: Secondary | ICD-10-CM | POA: Diagnosis not present

## 2021-09-29 DIAGNOSIS — K5900 Constipation, unspecified: Secondary | ICD-10-CM | POA: Diagnosis not present

## 2021-09-29 DIAGNOSIS — Z5111 Encounter for antineoplastic chemotherapy: Secondary | ICD-10-CM | POA: Diagnosis not present

## 2021-09-29 DIAGNOSIS — R110 Nausea: Secondary | ICD-10-CM | POA: Diagnosis not present

## 2021-09-29 DIAGNOSIS — Z803 Family history of malignant neoplasm of breast: Secondary | ICD-10-CM | POA: Diagnosis not present

## 2021-09-29 DIAGNOSIS — Z79899 Other long term (current) drug therapy: Secondary | ICD-10-CM | POA: Diagnosis not present

## 2021-09-29 DIAGNOSIS — G893 Neoplasm related pain (acute) (chronic): Secondary | ICD-10-CM | POA: Diagnosis not present

## 2021-09-29 DIAGNOSIS — R6510 Systemic inflammatory response syndrome (SIRS) of non-infectious origin without acute organ dysfunction: Secondary | ICD-10-CM | POA: Diagnosis not present

## 2021-09-29 DIAGNOSIS — G4730 Sleep apnea, unspecified: Secondary | ICD-10-CM | POA: Diagnosis not present

## 2021-09-29 DIAGNOSIS — I10 Essential (primary) hypertension: Secondary | ICD-10-CM | POA: Diagnosis not present

## 2021-09-29 DIAGNOSIS — E669 Obesity, unspecified: Secondary | ICD-10-CM | POA: Diagnosis not present

## 2021-09-29 DIAGNOSIS — D696 Thrombocytopenia, unspecified: Secondary | ICD-10-CM | POA: Diagnosis not present

## 2021-09-29 DIAGNOSIS — R634 Abnormal weight loss: Secondary | ICD-10-CM

## 2021-09-29 DIAGNOSIS — R630 Anorexia: Secondary | ICD-10-CM

## 2021-09-29 DIAGNOSIS — R531 Weakness: Secondary | ICD-10-CM | POA: Diagnosis not present

## 2021-09-29 DIAGNOSIS — D649 Anemia, unspecified: Secondary | ICD-10-CM | POA: Diagnosis not present

## 2021-09-29 LAB — CBC WITH DIFFERENTIAL (CANCER CENTER ONLY)
Abs Immature Granulocytes: 0.02 10*3/uL (ref 0.00–0.07)
Basophils Absolute: 0 10*3/uL (ref 0.0–0.1)
Basophils Relative: 1 %
Eosinophils Absolute: 0.1 10*3/uL (ref 0.0–0.5)
Eosinophils Relative: 1 %
HCT: 28.6 % — ABNORMAL LOW (ref 36.0–46.0)
Hemoglobin: 8.1 g/dL — ABNORMAL LOW (ref 12.0–15.0)
Immature Granulocytes: 0 %
Lymphocytes Relative: 36 %
Lymphs Abs: 2.7 10*3/uL (ref 0.7–4.0)
MCH: 24.9 pg — ABNORMAL LOW (ref 26.0–34.0)
MCHC: 28.3 g/dL — ABNORMAL LOW (ref 30.0–36.0)
MCV: 88 fL (ref 80.0–100.0)
Monocytes Absolute: 0.4 10*3/uL (ref 0.1–1.0)
Monocytes Relative: 5 %
Neutro Abs: 4.4 10*3/uL (ref 1.7–7.7)
Neutrophils Relative %: 57 %
Platelet Count: 860 10*3/uL — ABNORMAL HIGH (ref 150–400)
RBC: 3.25 MIL/uL — ABNORMAL LOW (ref 3.87–5.11)
RDW: 18.1 % — ABNORMAL HIGH (ref 11.5–15.5)
WBC Count: 7.6 10*3/uL (ref 4.0–10.5)
nRBC: 0 % (ref 0.0–0.2)

## 2021-09-29 LAB — CMP (CANCER CENTER ONLY)
ALT: 9 U/L (ref 0–44)
AST: 15 U/L (ref 15–41)
Albumin: 2.6 g/dL — ABNORMAL LOW (ref 3.5–5.0)
Alkaline Phosphatase: 91 U/L (ref 38–126)
Anion gap: 11 (ref 5–15)
BUN: 13 mg/dL (ref 6–20)
CO2: 30 mmol/L (ref 22–32)
Calcium: 8.3 mg/dL — ABNORMAL LOW (ref 8.9–10.3)
Chloride: 102 mmol/L (ref 98–111)
Creatinine: 0.61 mg/dL (ref 0.44–1.00)
GFR, Estimated: >60 mL/min (ref >60)
Glucose, Bld: 86 mg/dL (ref 70–99)
Potassium: 3.3 mmol/L — ABNORMAL LOW (ref 3.5–5.1)
Sodium: 143 mmol/L (ref 135–145)
Total Bilirubin: 0.2 mg/dL — ABNORMAL LOW (ref 0.3–1.2)
Total Protein: 6.7 g/dL (ref 6.5–8.1)

## 2021-09-29 LAB — PREGNANCY, URINE: Preg Test, Ur: NEGATIVE

## 2021-09-29 LAB — CEA (IN HOUSE-CHCC): CEA (CHCC-In House): 35.71 ng/mL — ABNORMAL HIGH (ref 0.00–5.00)

## 2021-09-29 LAB — TSH: TSH: 2.031 u[IU]/mL (ref 0.350–4.500)

## 2021-09-29 MED ORDER — SODIUM CHLORIDE 0.9 % IV SOLN
200.0000 mg | Freq: Once | INTRAVENOUS | Status: AC
  Administered 2021-09-29: 200 mg via INTRAVENOUS
  Filled 2021-09-29: qty 200

## 2021-09-29 MED ORDER — SODIUM CHLORIDE 0.9% FLUSH
10.0000 mL | Freq: Once | INTRAVENOUS | Status: AC
  Administered 2021-09-29: 10 mL

## 2021-09-29 MED ORDER — SODIUM CHLORIDE 0.9 % IV SOLN: Freq: Once | INTRAVENOUS | Status: AC

## 2021-09-29 MED ORDER — LIDOCAINE-PRILOCAINE 2.5-2.5 % EX CREA
1.0000 | TOPICAL_CREAM | CUTANEOUS | 0 refills | Status: DC | PRN
Start: 2021-09-29 — End: 2021-10-19
  Filled 2021-09-29 – 2021-10-14 (×2): qty 30, 30d supply, fill #0

## 2021-09-29 MED ORDER — ALTEPLASE 2 MG IJ SOLR: 2.0000 mg | Freq: Once | INTRAMUSCULAR | Status: DC

## 2021-09-29 NOTE — Progress Notes (Addendum)
?[facility]   ?Telephone:(336) [phone] [phone]   ?Clinic Follow up Note  ? ?Patient Care Team: ?[name], MD as PCP - General (Internal Medicine) ?[name], MD as PCP - Cardiology (Cardiology) ?[name], [name] as Librarian, academic (Emergency Medicine) ?[name], MD as Consulting Physician (Oncology) ?Earl Gala, Deliah Goody, RN as Sales executive (Oncology) ?[name], MD as Consulting Physician (General Surgery) ?[name], [name], MD as Consulting Physician (Neurology) ?Cheri Fowler, MD as Consulting Physician (Obstetrics and Gynecology) ?[name], MD as Consulting Physician (Gastroenterology) ?[name], [name], NP as Nurse Practitioner (Nurse Practitioner) ? ?Date of Service:  09/29/2021 ? ?CHIEF COMPLAINT: f/u of sigmoid colon cancer ? ?CURRENT THERAPY:  ?Keytruda, q21d, started 09/08/21 ? ?ASSESSMENT & PLAN:  ?Mikayla Rios is a 36 y.o. female with  ? ?1. 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 She was found to have diverticulitis with perforation. She also developed an abscess requiring 2 drains. She continued to have abdominal pain and drainage, undergoing drain exchange three times. ?-she developed worsening weakness and was admitted on 04/10/21. She was taken for emergent small bowel and left colon resection on 04/15/21 under Dr. Johney Maine. 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 Margins negative for invasive carcinoma, but low-grade dysplasia involves proximal margin. All 5 lymph nodes negative (0/5). MSI low. ?-repeat CT AP on 06/16/21 showed progressive metastatic disease involving soft tissue masses in left psoas muscle and left lateral abdominal wall and peritoneal masses, and new  mild retroperitoneal lymphadenopathy.  ?-baseline CEA on 06/22/21 was elevated at 87.41. ?-she started FOLFOX on 06/22/21 but did not respond ?-she switched to [facility] on 09/08/21. She experienced loss of appetite and moderate fatigue for the first week, but she greatly improved by the second week. She overall feels better with much improved abdominal pain  ?-labs reviewed, overall stable. We will proceed with second dose Keytruda as scheduled. We will attempt full dose again today and monitor her fatigue. ?  ?2. Symptom Management: Abdominal pain, Weight loss ?-her pain has greatly improved following first Keytruda infusion, and she no longer requires pain medication. ?-she does continue to lose weight, likely in part due to fluid fluctuation. ?  ?3. Anxiety and depression ?-Due to the prolonged illness and multiple hospital stay since May 2022, and cancer diagnosis, she has been feeling depressed lately. ?-cymbalta prescribed 05/25/21 ?-She had panic attack 2/13 after last chemo, when she was reacting to clorhexidine.  ?-F/up Dr. Michail Sermon ?  ?4. Genetics, FAP ?-FO showed MSI-High disease, and showed multiple other mutations including APC, [name]6, and BRCA2 mutations.  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  ?-I indicated her children should seek testing, when appropriate. Her 49 yo son is eligible now, and possibly her 68 yo daughter who is mature. 95 yo child may be too young  ?  ?5. Social Support ?-she is connected with Education officer, museum ?-she is working to apply for disability. Her job ended just prior to the start of her symptoms. ?-she has three children-- ages 17, 27, and 85. ?  ?6. Moderate anemia and thrombocytopenia  ?-history of IDA from her colon cancer,on oral iron, recent ferritin mormal ?-thrombocytopenia is likely reactive, secondary to her malignancy  ?-blood transfusion if Hg<8.0 ?  ?  PLAN: ?-proceed with second dose Keytruda,  will give NS 552ml infusion today  ?            -she will see NP Nikki in infusion ?-lab, PICC flush, f/u, and Keytruda treatment in 3 weeks  ? ? ?No problem-specific Assessment & Plan notes found for this encounter. ? ? ?SUMMARY OF ONCOLOGIC HISTORY: ?Oncology History Overview Note  ? Cancer Staging  ?Malignant neoplasm of sigmoid colon ([place]) ?Staging form: Colon and Rectum, AJCC 8th Edition ?- Pathologic stage from 04/15/2021: Stage IIC (pT4b, pN0, cM0) - Signed by [name], MD on 06/12/2021 ? ?  ?Malignant neoplasm of sigmoid colon ([place])  ? Initial Diagnosis  ? Malignant neoplasm of sigmoid colon [facility]) ?  ?11/04/2020 Imaging  ? EXAM: ?CT ABDOMEN AND PELVIS WITHOUT CONTRAST ? ?IMPRESSION: ?1. Perforating descending colonic diverticulitis with multiple ?abdominopelvic gas and fluid collections, measuring up to 5.7 cm and further described above. ?2. Cholelithiasis without findings of acute cholecystitis. ?3. 3.6 cm benign left adrenal adenoma. ?4. Leiomyomatous uterus. ?5. 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 ?  ?11/07/2020 Imaging  ? EXAM: ?CT ABDOMEN AND PELVIS WITHOUT CONTRAST ? ?IMPRESSION: ?Continued wall thickening is seen involving descending colon ?suggesting infectious or inflammatory colitis or perforated ?diverticulitis. There is an adjacent fluid collection measuring 7.0 ?x 5.0 cm consistent with abscess which is significantly enlarged ?compared to prior exam. Adjacent to the abscess, there is a severely thickened small bowel loop most consistent with secondary inflammation. ?  ?5.2 x 3.5 cm fluid collection is noted within the left psoas muscle ?consistent with abscess which is significantly enlarged compared to prior exam. 4.4 x 3.8 cm fluid collection consistent with abscess is noted in the left retroperitoneal region which is also enlarged ?compared to prior exam. ?  ?Fibroid uterus. ?   ?Cholelithiasis. ?  ?11/27/2020 Imaging  ? EXAM: ?CT ABDOMEN AND PELVIS WITHOUT CONTRAST ? ?IMPRESSION: ?1. Significantly interval decreased size of the previously ?visualized left retroperitoneal and left anterior mid abdominal ?fluid collections. There is persistent fat stranding and mural ?thickening of the mid descending colon at this level. ?2. Unchanged leiomyomatous uterus. ?3. Unchanged cholelithiasis. ?  ?12/11/2020 Imaging  ? EXAM: ?CT ABDOMEN AND PELVIS WITHOUT CONTRAST ? ?IMPRESSION: ?1. Stable inflammatory changes centered around the distal descending colon in the left lower abdomen. Pericolonic inflammatory changes have minimally changed since 11/27/2020. Small pocket of gas medial to the colon is probably associated with a fistula or small residual abscess collection. ?2. Stable position of the two percutaneous drains. The more anterior drain may be extending through a portion of the small bowel. ?3. Cholelithiasis. ?4. Fibroid uterus. Cannot exclude an ovarian/adnexal cystic ?structure near the uterine fundus. ?5. Left adrenal adenoma. ?  ?01/07/2021 Imaging  ? EXAM: ?CT ABDOMEN AND PELVIS WITH CONTRAST ? ?IMPRESSION: ?1. No new abscesses identified. Similar degree of soft tissue ?thickening seen in the left pericolonic region. The degree of ?persistent soft tissues thickening in the pericolonic space further ?raises suspicions for malignancy. Further evaluation with ?colonoscopy should be performed. ?2. Left retroperitoneal abscess drain unchanged in position. ?Anterior left abdominal drain again seen terminating within small ?bowel loop. ?3. 2.6 cm mildly sclerotic lesion noted in the L2 vertebral body. ?Further evaluation with contrast enhanced lumbar spine MRI should be performed. ?  ?02/04/2021 Imaging  ? EXAM: ?CT ABDOMEN AND PELVIS WITH CONTRAST ? ?IMPRESSION: ?No new abdominopelvic collections or abscess development  in the 1 month interval. ?  ?Left anterior drain remains within a loop of small  bowel, unchanged. ?  ?Left lateral abscess drain remains in the retroperitoneal space ?adjacent to the iliopsoas muscle with a small amount of residual ?fluid and air but no measurable collection. ?  ?Stable soft tissue

## 2021-09-29 NOTE — Patient Instructions (Signed)
Center  Discharge Instructions: ?Thank you for choosing [facility] to provide your oncology and hematology care.  ? ?If you have a lab appointment with the [facility], please go directly to the [facility] and check in at the registration area. ?  ?Wear comfortable clothing and clothing appropriate for easy access to any Portacath or PICC line.  ? ?We strive to give you quality time with your provider. You may need to reschedule your appointment if you arrive late (15 or more minutes).  Arriving late affects you and other patients whose appointments are after yours.  Also, if you miss three or more appointments without notifying the office, you may be dismissed from the clinic at the provider?s discretion.    ?  ?For prescription refill requests, have your pharmacy contact our office and allow 72 hours for refills to be completed.   ? ?Today you received the following chemotherapy and/or immunotherapy agents: Keytruda (pembrolizumab).    ?  ?To help prevent nausea and vomiting after your treatment, we encourage you to take your nausea medication as directed. ? ?BELOW ARE SYMPTOMS THAT SHOULD BE REPORTED IMMEDIATELY: ?*FEVER GREATER THAN 100.4 F (38 ?C) OR HIGHER ?*CHILLS OR SWEATING ?*NAUSEA AND VOMITING THAT IS NOT CONTROLLED WITH YOUR NAUSEA MEDICATION ?*UNUSUAL SHORTNESS OF BREATH ?*UNUSUAL BRUISING OR BLEEDING ?*URINARY PROBLEMS (pain or burning when urinating, or frequent urination) ?*BOWEL PROBLEMS (unusual diarrhea, constipation, pain near the anus) ?TENDERNESS IN MOUTH AND THROAT WITH OR WITHOUT PRESENCE OF ULCERS (sore throat, sores in mouth, or a toothache) ?UNUSUAL RASH, SWELLING OR PAIN  ?UNUSUAL VAGINAL DISCHARGE OR ITCHING  ? ?Items with * indicate a potential emergency and should be followed up as soon as possible or go to the Emergency Department if any problems should occur. ? ?Please show the CHEMOTHERAPY ALERT CARD or IMMUNOTHERAPY ALERT CARD  at check-in to the Emergency Department and triage nurse. ? ?Should you have questions after your visit or need to cancel or reschedule your appointment, please contact [facility]  Dept: [phone]  and follow the prompts.  Office hours are 8:00 a.m. to 4:30 p.m. Monday - Friday. Please note that voicemails left after 4:00 p.m. may not be returned until the following business day.  We are closed weekends and major holidays. You have access to a nurse at all times for urgent questions. Please call the main number to the clinic Dept: [phone] and follow the prompts. ? ? ?For any non-urgent questions, you may also contact your provider using MyChart. We now offer e-Visits for anyone 64 and older to request care online for non-urgent symptoms. For details visit [email or web address]. ?  ?Also download the MyChart app! Go to the app store, search "MyChart", open the app, select [facility], and log in with your MyChart username and password. ? ?Due to Covid, a mask is required upon entering the hospital/clinic. If you do not have a mask, one will be given to you upon arrival. For doctor visits, patients may have 1 support person aged 55 or older with them. For treatment visits, patients cannot have anyone with them due to current Covid guidelines and our immunocompromised population.  ? ?

## 2021-09-29 NOTE — Progress Notes (Signed)
This patient has not had any appointments to have her picc line changed for the last three weeks and her picc line dressing was dirty and had no blood return sent patient to lab for blood draw for blood samples. ?

## 2021-09-29 NOTE — Progress Notes (Signed)
Per Dr. Burr Medico, okay to treat with HR 115 bpm. ?

## 2021-09-29 NOTE — Progress Notes (Signed)
? ?  ?Palliative Medicine ?[facility]  ?Telephone:([phone]) [phone] [phone] ? ? ?Name: Mikayla Rios ?Date: 09/29/2021 ?MRN: [id number]  ?DOB: [birth year] ? ?Patient Care Team: ?[name], MD as PCP - General (Internal Medicine) ?[name], MD as PCP - Cardiology (Cardiology) ?[name], [name] as Librarian, academic (Emergency Medicine) ?[name], MD as Consulting Physician (Oncology) ?Earl Gala, Deliah Goody, RN as Sales executive (Oncology) ?[name], MD as Consulting Physician (General Surgery) ?[name], [name], MD as Consulting Physician (Neurology) ?Cheri Fowler, MD as Consulting Physician (Obstetrics and Gynecology) ?[name], MD as Consulting Physician (Gastroenterology) ?[name], [name], NP as Nurse Practitioner (Nurse Practitioner)  ? ? ?INTERVAL HISTORY: ?Mikayla Rios is a 36 y.o. 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 Recent CT scan on 06/16/21 showed progressive metastatic disease. Palliative ask to see for symptom management. ?  ? ?SOCIAL HISTORY:    ? reports that she has never smoked. She has never used smokeless tobacco. She reports that she does not currently use alcohol. She reports that she does not use drugs. ? ?ADVANCE DIRECTIVES:  ? ? ?CODE STATUS:  ? ?PAST MEDICAL HISTORY: ?Past Medical History:  ?Diagnosis Date  ? Allergy   ? seasonal  ? Anemia   ? Blood infection 1985  ? Blood transfusion without reported diagnosis   ? Diverticulitis   ? Family history of breast cancer   ? Family history of colon cancer   ? Family history of stomach cancer   ? Hypertension   ? Obesity   ? Sleep apnea   ? ? ?ALLERGIES:  is allergic to chlorhexidine gluconate, shellfish allergy, and penicillins. ? ?MEDICATIONS:  ?Current Outpatient Medications  ?Medication Sig Dispense Refill  ? amLODipine (NORVASC) 10 MG  tablet Take 10 mg by mouth daily.    ? diclofenac Sodium (VOLTAREN) 1 % GEL Apply 4 g topically 4 (four) times daily as needed (for mild pain). 4 g 0  ? DULoxetine (CYMBALTA) 30 MG capsule Take 1 capsule (30 mg total) by mouth daily. 60 capsule 1  ? gabapentin (NEURONTIN) 300 MG capsule Take 2 capsules (600 mg total) by mouth 3 (three) times daily. 120 capsule 1  ? heparin lock flush 100 UNIT/ML SOLN injection Use 1 syringe (into each lumen) twice daily 600 mL 0  ? HYDROmorphone (DILAUDID) 2 MG tablet Take 1-2 tablets (2-4 mg total) by mouth every 4 (four) hours as needed for severe pain or moderate pain. 120 tablet 0  ? metoprolol tartrate (LOPRESSOR) 50 MG tablet Take 1 tablet (50 mg total) by mouth 2 (two) times daily. 180 tablet 2  ? morphine (MS CONTIN) 30 MG 12 hr tablet Take 1 tablet (30 mg total) by mouth every 8 (eight) hours. 90 tablet 0  ? potassium chloride SA (KLOR-CON M20) 20 MEQ tablet Take 1 tablet (20 mEq total) by mouth 2 (two) times daily for 65 doses. Take 1 tab (20 mEq total) by mouth 3 (three) times daily for 5 days and the Take 1 tab (20 mEq total) by mouth 2 (two) times daily for 25 days. 65 tablet 1  ? Sodium Chloride Flush (NORMAL SALINE FLUSH) 0.9 % SOLN Inject 10 mLs (1 syringe) into lumen twice daily as directed 1200 mL 2  ? ?No current facility-administered medications for this visit.  ? ?Facility-Administered Medications Ordered in Other Visits  ?Medication Dose Route Frequency Provider Last Rate  Last Admin  ? pembrolizumab (KEYTRUDA) 200 mg in sodium chloride 0.9 % 50 mL chemo infusion  200 mg Intravenous Once [name], MD      ? ? ?VITAL SIGNS: ?There were no vitals taken for this visit. ?There were no vitals filed for this visit. ? ?  ?Estimated body mass index is 29.85 kg/m? as calculated from the following: ?  Height as of an earlier encounter on 09/29/21: '5\' 3"'$  (1.6 m). ?  Weight as of an earlier encounter on 09/29/21: 168 lb 8 oz (76.4 kg). ? ? ?PERFORMANCE STATUS (ECOG) : 1 -  Symptomatic but completely ambulatory ? ? ?Physical Exam ?General: NAD ?Cardiovascular:tachycardic  ?Abdomen: soft, tender, + bowel sounds, colostomy in place ?Neurological: AAO x4, mood appropriate ? ?IMPRESSION: ? ?I saw Mikayla Rios during her infusion today. She was seen by Dr. Burr Medico today also. Mikayla Rios is doing much better since starting Keytruda. She express appreciation sharing she has been able to do more things around the home with her family. She cooked a family meal this past week which she has not had a desire to do in almost a year. It is a pleasure to see the happiness in her improved quality of life.  ?Does endorse fatigue after initial treatment but this improved over several days.  ? ?Neoplams related pain ?Mikayla Rios's pain is much improved since starting Keytruda. She is no longer requiring breakthrough medication (hydromorphone). States she has not taken in over a week. This is a great improvement. Continues on MS Contin 30 mg every 8 hours. Given her significant improvement will plan to wean accordingly. She will now take MS Contin twice daily and discontinue midday dose. Understands we will continue to closely monitor and adjust accordingly.  ? ? ?Anxiety  ?Continues to focus on her health and life one day at at time.  Her anxiety has improved specifically due to her improved quality of life and ability to "live" again. Emotional support provided. Mikayla Rios is focused on the positives, time with family, and remaining hopeful that new treatment will continue to offer a better response. She has a strong spiritual foundation. Followed by Dr. Nelida Meuse which she is appreciative of.  ? ?3. Constipation ?Constipation improved with Miralax. States now that her appetite is improving she is noticing increase in gas after each meal. Denies nausea or vomiting. She has been focusing on what foods she is eating and identifying any that may escalate her symptoms. Education provided on use of Beano prior to each meal to  assist in breaking down carbs and decreasing gas symptoms. Also discussed use of simethicone and lactaid or watching daily intake if noticing dairy intolerance. She verbalized understanding and appreciation.  ? ?4. Nausea/decreased appetite  ?Mikayla Rios reports her appetite is improved however she does continue to have some weight loss. She is hopeful this will improve. Also notes she is not able to eat as much as she once did due to feelings of fullness and also having bowel movements after she eats. She is drinking 1-2 Ensure daily. Nausea has improved. Compazine and Zofran readily available if needed. Education on decreasing sudden exposures to highly aromatized foods (which tends to be hot meals). Allowing time to sit on table before eating or sitting in front of food. Encouraged small frequent meals versus trying to eat 3 large meals a day.  ? ?Her current weight is 168 lbs down from 173 lbs 4/4,  177lbs on 3/15, 179 lbs on 3/13, 182 lbs on 2/27. We discussed  appetite stimulant but with plans to closely monitor before starting. She would like to continue to assess and reevaluate at follow-up visit if necessary. ? ?I discussed the importance of continued conversation with family and their medical providers regarding overall plan of care and treatment options, ensuring decisions are within the context of the patients values and GOCs. ? ?PLAN: ?Pain is much improved with immunotherapy. Is not requiring hydromorphone for breakthrough pain.  ?We will decrease MS Contin 30 mg to every 12 hours vs every 8 hours with a goal of weaning. Will monitor closely  ?Gabapentin '600mg'$  3 times daily ?Cymbalta 30 mg twice daily ?Beano/Simethicone as needed for gas ?Education on increased protein intake and nutrition.  ?Miralax daily for constipation ?I will plan to see her back in 2-3 weeks in collaboration with future appointments.  ? ?Patient expressed understanding and was in agreement with this plan. She also understands that She  can call the clinic at any time with any questions, concerns, or complaints.  ? ?Time Total: 30 min.  ? ?Visit consisted of counseling and education dealing with the complex and emotionally intense issues

## 2021-09-29 NOTE — Telephone Encounter (Signed)
Spoke with pt via telephone regarding her PICC line.  Informed pt that her PICC will need to removed d/t PICC is no longer in ideal location.  Pt stated she was told that IR could advance the PICC 6-7cm and she will be able to keep the PICC.  Educated pt that the PICC cannot be advanced d/t CLASBI Protocol.  Pt verbalized understanding.  Informed pt that a Port is needed which can be placed under local anesthestia.  Pt agreed to have Port placed and pt will come into Infusion on 09/30/2021 '@0900'$  to have PICC removed.  Contacted IR to have Port placement appt.  Pt is scheduled to have port placed on Friday 10/02/2021 at 12:30 pm at [facility].  Pt will need to be NPO after 8am on 10/02/2021.  Contact pt via telephone regarding appt information.  Pt confirmed both appts. ?

## 2021-09-30 ENCOUNTER — Inpatient Hospital Stay: Payer: BC Managed Care – PPO

## 2021-09-30 ENCOUNTER — Other Ambulatory Visit: Payer: Self-pay

## 2021-09-30 LAB — T4: T4, Total: 6.6 ug/dL (ref 4.5–12.0)

## 2021-09-30 NOTE — Patient Instructions (Signed)
PICC Removal, Adult, Care After The following information offers guidance on how to care for yourself after your procedure. Your health care provider may also give you more specific instructions. If you have problems or questions, contact your health care provider. What can I expect after the procedure? After the procedure, it is common to have: Tenderness or soreness. Redness, swelling, or a scab at the place where your PICC was removed (exit site). Follow these instructions at home: For the first 24 hours after the procedure: Keep the bandage (dressing) on your exit site clean and dry. Do not remove your dressing until your health care provider tells you to do so. Do not lift anything heavy or do activities that require great effort until your health care provider says it is okay. You should avoid: Lifting weights. Doing yard work. Doing any physical activity with repetitive arm movement. Watch closely for any signs of an air bubble in the vein (air embolism). This is a rare but serious complication. Signs of an air embolism include trouble breathing, wheezing, chest pain, or a fast pulse. If you have signs of an air embolism, call 911 right away and lie down on your left side to keep the air from moving into your lungs. After 24 hours have passed:  Remove your dressing as told by your health care provider. Wash your hands with soap and water for at least 20 seconds before and after you change your dressing. If soap and water are not available, use hand sanitizer. Return to your normal activities as told by your health care provider. A small scab may develop over the exit site. Do not pick at the scab. When bathing or showering, gently wash the exit site with soap and water. Pat it dry. Watch for signs of infection, such as: A fever or chills. Swollen glands under your arm. More redness, swelling, or soreness around your arm. Blood, fluid, or pus coming from your exit site. Warmth or a  bad smell coming from your exit site. A red streak spreading away from your exit site. General instructions Take over-the-counter and prescription medicines only as told by your health care provider. Do not take any new medicines without checking with your health care provider first. If you were given an antibiotic ointment, apply it as told by your health care provider. Keep all follow-up visits. This is important. Contact a health care provider if: You have a fever or chills. You have swelling at your exit site or swollen glands under your arm. You have signs of infection at your exit site. You have soreness, redness, or swelling in your arm that gets worse. Get help right away if: You have numbness or tingling in your fingers, hand, or arm. Your arm looks blue and feels cold. You have signs of an air embolism, such as trouble breathing, wheezing, chest pain, or a fast pulse. These symptoms may be an emergency. Get medical help right away. Call 911. Do not wait to see if the symptoms will go away. Do not drive yourself to the hospital. Summary After a PICC is removed, it is common to have tenderness or soreness, redness, swelling, or a scab at the exit site. Keep the bandage (dressing) over the exit site clean and dry. Do not remove the dressing until your health care provider tells you to do so. Do not lift anything heavy or do activities that require great effort until your health care provider says it is okay. Watch closely for any signs   of an air bubble (air embolism). If you have signs of an air embolism, call 911 right away and lie down on your left side. This information is not intended to replace advice given to you by your health care provider. Make sure you discuss any questions you have with your health care provider. Document Revised: 12/10/2020 Document Reviewed: 12/10/2020 Elsevier Patient Education  2023 [employer]  

## 2021-09-30 NOTE — Progress Notes (Signed)
PICC line removed per order.  34cm observed on line.  Pressure applied.  Pressure bandage applied.  Pt tolerated well.  Pt observed 30 minutes post procedure.  VSS.   ?

## 2021-10-01 ENCOUNTER — Encounter: Payer: Self-pay | Admitting: Hematology

## 2021-10-01 ENCOUNTER — Other Ambulatory Visit: Payer: Self-pay | Admitting: Radiology

## 2021-10-01 NOTE — Addendum Note (Signed)
Addended by: [name] on: 10/01/2021 07:31 AM ? ? Modules accepted: Orders ? ?

## 2021-10-02 ENCOUNTER — Ambulatory Visit (HOSPITAL_COMMUNITY)
Admission: RE | Admit: 2021-10-02 | Discharge: 2021-10-02 | Disposition: A | Discharge status: Home / Self Care | Payer: BC Managed Care – PPO | Source: Ambulatory Visit | Attending: Hematology | Admitting: Hematology

## 2021-10-02 ENCOUNTER — Encounter (HOSPITAL_COMMUNITY): Payer: Self-pay

## 2021-10-02 DIAGNOSIS — Z6837 Body mass index (BMI) 37.0-37.9, adult: Secondary | ICD-10-CM | POA: Diagnosis not present

## 2021-10-02 DIAGNOSIS — G4730 Sleep apnea, unspecified: Secondary | ICD-10-CM | POA: Insufficient documentation

## 2021-10-02 DIAGNOSIS — Z9181 History of falling: Secondary | ICD-10-CM | POA: Diagnosis not present

## 2021-10-02 DIAGNOSIS — C189 Malignant neoplasm of colon, unspecified: Secondary | ICD-10-CM | POA: Diagnosis not present

## 2021-10-02 DIAGNOSIS — Z933 Colostomy status: Secondary | ICD-10-CM | POA: Diagnosis not present

## 2021-10-02 DIAGNOSIS — K5792 Diverticulitis of intestine, part unspecified, without perforation or abscess without bleeding: Secondary | ICD-10-CM | POA: Diagnosis not present

## 2021-10-02 DIAGNOSIS — E876 Hypokalemia: Secondary | ICD-10-CM | POA: Diagnosis not present

## 2021-10-02 DIAGNOSIS — C187 Malignant neoplasm of sigmoid colon: Secondary | ICD-10-CM | POA: Diagnosis not present

## 2021-10-02 DIAGNOSIS — Z483 Aftercare following surgery for neoplasm: Secondary | ICD-10-CM | POA: Diagnosis not present

## 2021-10-02 DIAGNOSIS — Z791 Long term (current) use of non-steroidal anti-inflammatories (NSAID): Secondary | ICD-10-CM | POA: Diagnosis not present

## 2021-10-02 DIAGNOSIS — E669 Obesity, unspecified: Secondary | ICD-10-CM | POA: Diagnosis not present

## 2021-10-02 DIAGNOSIS — D649 Anemia, unspecified: Secondary | ICD-10-CM | POA: Diagnosis not present

## 2021-10-02 DIAGNOSIS — I10 Essential (primary) hypertension: Secondary | ICD-10-CM | POA: Insufficient documentation

## 2021-10-02 DIAGNOSIS — Z452 Encounter for adjustment and management of vascular access device: Secondary | ICD-10-CM | POA: Diagnosis not present

## 2021-10-02 DIAGNOSIS — Z79891 Long term (current) use of opiate analgesic: Secondary | ICD-10-CM | POA: Diagnosis not present

## 2021-10-02 DIAGNOSIS — E440 Moderate protein-calorie malnutrition: Secondary | ICD-10-CM | POA: Diagnosis not present

## 2021-10-02 DIAGNOSIS — Z9049 Acquired absence of other specified parts of digestive tract: Secondary | ICD-10-CM | POA: Diagnosis not present

## 2021-10-02 HISTORY — PX: IR IMAGING GUIDED PORT INSERTION: IMG5740

## 2021-10-02 MED ORDER — MIDAZOLAM HCL 2 MG/2ML IJ SOLN
INTRAMUSCULAR | Status: AC
  Filled 2021-10-02: qty 2

## 2021-10-02 MED ORDER — MIDAZOLAM HCL 2 MG/2ML IJ SOLN
INTRAMUSCULAR | Status: AC | PRN
  Administered 2021-10-02: 1 mg via INTRAVENOUS

## 2021-10-02 MED ORDER — HEPARIN SOD (PORK) LOCK FLUSH 100 UNIT/ML IV SOLN
INTRAVENOUS | Status: AC | PRN
  Administered 2021-10-02: 500 [IU]

## 2021-10-02 MED ORDER — SODIUM CHLORIDE 0.9 % IV SOLN: INTRAVENOUS | Status: DC

## 2021-10-02 MED ORDER — LIDOCAINE-EPINEPHRINE 1 %-1:100000 IJ SOLN
INTRAMUSCULAR | Status: AC
  Filled 2021-10-02: qty 1

## 2021-10-02 MED ORDER — HEPARIN SOD (PORK) LOCK FLUSH 100 UNIT/ML IV SOLN
INTRAVENOUS | Status: AC
  Filled 2021-10-02: qty 5

## 2021-10-02 MED ORDER — FENTANYL CITRATE (PF) 100 MCG/2ML IJ SOLN
INTRAMUSCULAR | Status: AC | PRN
Start: 2021-10-02 — End: 2021-10-02
  Administered 2021-10-02: 50 ug via INTRAVENOUS

## 2021-10-02 MED ORDER — FENTANYL CITRATE (PF) 100 MCG/2ML IJ SOLN
INTRAMUSCULAR | Status: AC
  Filled 2021-10-02: qty 2

## 2021-10-02 MED ORDER — LIDOCAINE-EPINEPHRINE 1 %-1:100000 IJ SOLN
INTRAMUSCULAR | Status: AC | PRN
  Administered 2021-10-02: 15 mL via INTRADERMAL

## 2021-10-02 NOTE — Procedures (Signed)
Interventional Radiology Procedure Note  Procedure: Port placement.  Indication: Colon Ca  Findings: Please refer to procedural dictation for full description.  Complications: None  EBL: < 10 mL  Demitrios Molyneux, MD [phone]   

## 2021-10-02 NOTE — H&P (Signed)
? ? ?Referring Physician(s): ?Feng,Yan ? ?Supervising Physician: Mir, Sharen Heck ? ?Patient Status:  [facility] ? ?Chief Complaint: ? ?"I'm here to get a port a cath" ? ?Subjective: ?Patient known to IR service from abdominal/retroperitoneal abscess drain placements in 2022, PICC placement on 06/22/2021, and peritoneal mass biopsy on 07/10/2021.  Her PICC line was removed yesterday by oncology staff. ?Previous chest x-ray showed PICC tip retraction.  She has a history of metastatic colon carcinoma diagnosed in 2022, now with some interval progression of disease and presents today for Port-A-Cath placement to assist with additional  treatment.  Past medical history also significant for anemia, diverticular disease, hypertension, obesity and sleep apnea. She denies fever,HA, CP, dyspnea, cough, worsening abd pain,N/V or bleeding. She is anxious.  ? ?Past Medical History:  ?Diagnosis Date  ? Allergy   ? seasonal  ? Anemia   ? Blood infection 1985  ? Blood transfusion without reported diagnosis   ? Diverticulitis   ? Family history of breast cancer   ? Family history of colon cancer   ? Family history of stomach cancer   ? Hypertension   ? Obesity   ? Sleep apnea   ? ?Past Surgical History:  ?Procedure Laterality Date  ? COLECTOMY WITH COLOSTOMY CREATION/HARTMANN PROCEDURE N/A 04/15/2021  ? Procedure: COLOSTOMY CREATION/HARTMANN PROCEDURE;  Surgeon: [name], MD;  Location: [facility];  Service: General;  Laterality: N/A;  ? IR CATHETER TUBE CHANGE  12/12/2020  ? IR CATHETER TUBE CHANGE  01/27/2021  ? IR CATHETER TUBE CHANGE  02/19/2021  ? IR CATHETER TUBE CHANGE  04/13/2021  ? IR RADIOLOGIST EVAL & MGMT  11/27/2020  ? IR RADIOLOGIST EVAL & MGMT  12/11/2020  ? IR RADIOLOGIST EVAL & MGMT  01/07/2021  ? IR RADIOLOGIST EVAL & MGMT  02/04/2021  ? IR SINUS/FIST TUBE CHK-NON GI  12/12/2020  ? IR SINUS/FIST TUBE CHK-NON GI  02/19/2021  ? LAPAROSCOPIC PARTIAL COLECTOMY N/A 04/15/2021  ? Procedure: LAPAROSCOPIC ASSISTED HARTMANN RESECTION;   Surgeon: [name], MD;  Location: [facility];  Service: General;  Laterality: N/A;  ? ? ? ?Allergies: ?Chlorhexidine gluconate, Shellfish allergy, and Penicillins ? ?Medications: ?Prior to Admission medications   ?Medication Sig Start Date End Date Taking? Authorizing Provider  ?amLODipine (NORVASC) 10 MG tablet Take 10 mg by mouth daily. 05/26/21   [provider]  ?diclofenac Sodium (VOLTAREN) 1 % GEL Apply 4 g topically 4 (four) times daily as needed (for mild pain). 05/14/21   Angiulli, Lavon Paganini, PA-C  ?DULoxetine (CYMBALTA) 30 MG capsule Take 1 capsule (30 mg total) by mouth daily. 09/14/21   [name], [name], NP  ?gabapentin (NEURONTIN) 300 MG capsule Take 2 capsules (600 mg total) by mouth 3 (three) times daily. 09/14/21   [name], [name], NP  ?heparin lock flush 100 UNIT/ML SOLN injection Use 1 syringe (into each lumen) twice daily 06/30/21   [name], MD  ?HYDROmorphone (DILAUDID) 2 MG tablet Take 1-2 tablets (2-4 mg total) by mouth every 4 (four) hours as needed for severe pain or moderate pain. 09/23/21   [name], [name], NP  ?lidocaine-prilocaine (EMLA) cream Apply a dime size amount of cream to [facility] prior 45 mins to 1 hr prior to Port-a-cath access as directed. 09/29/21   [name], MD  ?metoprolol tartrate (LOPRESSOR) 50 MG tablet Take 1 tablet (50 mg total) by mouth 2 (two) times daily. 07/14/21   [name], MD  ?morphine (MS CONTIN) 30 MG 12 hr tablet Take 1  tablet (30 mg total) by mouth every 8 (eight) hours. 09/16/21   [name], [name], NP  ?potassium chloride SA (KLOR-CON M20) 20 MEQ tablet Take 1 tablet (20 mEq total) by mouth 2 (two) times daily for 65 doses. Take 1 tab (20 mEq total) by mouth 3 (three) times daily for 5 days and the Take 1 tab (20 mEq total) by mouth 2 (two) times daily for 25 days. 09/09/21 10/12/21  [name], MD  ?Sodium Chloride Flush (NORMAL SALINE FLUSH) 0.9 % SOLN Inject 10 mLs (1 syringe) into lumen twice daily as  directed 06/30/21   [name], MD  ?prochlorperazine (COMPAZINE) 10 MG tablet Take 1 tablet (10 mg total) by mouth every 6 (six) hours as needed (Nausea or vomiting). 05/25/21 08/31/21  [name], MD  ? ? ? ?Vital Signs: ?Vitals:  ? 10/02/21 1300  ?BP: (!) 147/98  ?Pulse: 87  ?Resp: 18  ?Temp: 98.5 ?F (36.9 ?C)  ?SpO2: 100%  ? ? ? ? ?Physical Exam awake/alert; chest- CTA bilat; heart- RRR; abd- obese, soft,+BS, left sided colostomy intact; ext with FROM ? ?Imaging: ?DG Chest 1 View ? ?Result Date: 09/29/2021 ?CLINICAL DATA:  PICC placement EXAM: CHEST  1 VIEW COMPARISON:  Chest radiograph 04/16/2021 FINDINGS: The right upper extremity PICC tip projects over the expected location of the confluence with the subclavian and internal jugular veins. The cardiomediastinal silhouette is normal. There is no focal consolidation or pulmonary edema. There is no pleural effusion or pneumothorax There is no acute osseous abnormality. IMPRESSION: PICC tip projects over the expected location of the confluence with subclavian and internal jugular veins. Consider advancement by approximately 6-7 cm. These results will be called to the ordering clinician or representative by the Radiologist Assistant, and communication documented in the PACS or [employer]. Electronically Signed   By: Valetta Mole M.D.   On: 09/29/2021 09:48   ? ?Labs: ? ?CBC: ?Recent Labs  ?  08/03/21 ?1036 08/17/21 ?1027 09/08/21 ?1501 09/29/21 ?0906  ?WBC 8.0 3.6* 15.9* 7.6  ?HGB 10.3* 9.1* 8.5* 8.1*  ?HCT 32.8* 28.4* 28.3* 28.6*  ?PLT 456* 630* 818* 860*  ? ? ?COAGS: ?Recent Labs  ?  11/07/20 ?1820 04/11/21 ?0302 04/19/21 ?1500 07/10/21 ?0745  ?INR 1.3* 1.2 1.2 1.0  ?APTT  --  28 35  --   ? ? ?BMP: ?Recent Labs  ?  08/03/21 ?1036 08/17/21 ?1027 09/08/21 ?1501 09/29/21 ?0906  ?NA 142 138 143 143  ?K 3.2* 2.8* 2.8* 3.3*  ?CL 99 97* 101 102  ?CO2 34* 32 34* 30  ?GLUCOSE 97 86 92 86  ?BUN '16 9 18 13  '$ ?CALCIUM 9.3 8.2* 8.2* 8.3*  ?CREATININE 0.52 0.46 0.49 0.61   ?GFRNONAA >60 >60 >60 >60  ? ? ?LIVER FUNCTION TESTS: ?Recent Labs  ?  08/03/21 ?1036 08/17/21 ?1027 09/08/21 ?1501 09/29/21 ?0906  ?BILITOT 0.4 0.4 0.3 0.2*  ?AST 11* 12* 20 15  ?ALT '5 6 11 9  '$ ?ALKPHOS 52 71 155* 91  ?PROT 7.3 6.6 6.5 6.7  ?ALBUMIN 3.6 3.2* 2.7* 2.6*  ? ? ?Assessment and Plan: ?Patient known to IR service from abdominal/retroperitoneal abscess drain placements in 2022, PICC placement on 06/22/2021, and peritoneal mass biopsy on 07/10/2021.  Her PICC line was removed yesterday by oncology staff. ?Previous chest x-ray showed PICC tip retraction.  She has a history of metastatic colon carcinoma diagnosed in 2022, now with some interval progression of disease and presents today for Port-A-Cath placement to assist with additional  treatment.  Past medical history also significant for anemia, diverticular disease, hypertension, obesity and sleep apnea. Risks and benefits of image guided port-a-catheter placement was discussed with the patient including, but not limited to bleeding, infection, pneumothorax, or fibrin sheath development and need for additional procedures. ? ?All of the patient's questions were answered, patient is agreeable to proceed. ?Consent signed and in chart. ? ? ? ?Electronically Signed: ?Autumn Messing, PA-C ?10/02/2021, 12:55 PM ? ? ?I spent a total of 25 minutes at the the patient's bedside AND on the patient's hospital floor or unit, greater than 50% of which was counseling/coordinating care for port a cath placement ? ? ? ? ? ?

## 2021-10-03 MED ORDER — FENTANYL CITRATE (PF) 100 MCG/2ML IJ SOLN
INTRAMUSCULAR | Status: AC | PRN
  Administered 2021-10-02: 50 ug via INTRAVENOUS

## 2021-10-03 MED ORDER — MIDAZOLAM HCL 2 MG/2ML IJ SOLN
INTRAMUSCULAR | Status: AC | PRN
  Administered 2021-10-02: 1 mg via INTRAVENOUS

## 2021-10-06 ENCOUNTER — Other Ambulatory Visit: Payer: Self-pay | Admitting: Hematology

## 2021-10-06 ENCOUNTER — Inpatient Hospital Stay (HOSPITAL_COMMUNITY)
Admission: EM | Admit: 2021-10-06 | Discharge: 2021-10-11 | DRG: 309 | Disposition: A | Discharge status: Home Health | Payer: BC Managed Care – PPO | Attending: Internal Medicine | Admitting: Internal Medicine

## 2021-10-06 ENCOUNTER — Other Ambulatory Visit: Payer: Self-pay

## 2021-10-06 ENCOUNTER — Encounter (HOSPITAL_COMMUNITY): Payer: Self-pay

## 2021-10-06 ENCOUNTER — Emergency Department (HOSPITAL_COMMUNITY): Payer: BC Managed Care – PPO

## 2021-10-06 ENCOUNTER — Telehealth: Payer: Self-pay

## 2021-10-06 DIAGNOSIS — E119 Type 2 diabetes mellitus without complications: Secondary | ICD-10-CM | POA: Diagnosis not present

## 2021-10-06 DIAGNOSIS — I248 Other forms of acute ischemic heart disease: Secondary | ICD-10-CM | POA: Diagnosis present

## 2021-10-06 DIAGNOSIS — E669 Obesity, unspecified: Secondary | ICD-10-CM | POA: Diagnosis not present

## 2021-10-06 DIAGNOSIS — R6510 Systemic inflammatory response syndrome (SIRS) of non-infectious origin without acute organ dysfunction: Secondary | ICD-10-CM | POA: Diagnosis not present

## 2021-10-06 DIAGNOSIS — R000 Tachycardia, unspecified: Secondary | ICD-10-CM | POA: Diagnosis not present

## 2021-10-06 DIAGNOSIS — D696 Thrombocytopenia, unspecified: Secondary | ICD-10-CM | POA: Diagnosis not present

## 2021-10-06 DIAGNOSIS — Z79899 Other long term (current) drug therapy: Secondary | ICD-10-CM

## 2021-10-06 DIAGNOSIS — Z85038 Personal history of other malignant neoplasm of large intestine: Secondary | ICD-10-CM | POA: Diagnosis not present

## 2021-10-06 DIAGNOSIS — K029 Dental caries, unspecified: Secondary | ICD-10-CM | POA: Diagnosis not present

## 2021-10-06 DIAGNOSIS — Z8249 Family history of ischemic heart disease and other diseases of the circulatory system: Secondary | ICD-10-CM | POA: Diagnosis not present

## 2021-10-06 DIAGNOSIS — Z933 Colostomy status: Secondary | ICD-10-CM

## 2021-10-06 DIAGNOSIS — G4730 Sleep apnea, unspecified: Secondary | ICD-10-CM | POA: Diagnosis present

## 2021-10-06 DIAGNOSIS — R5382 Chronic fatigue, unspecified: Secondary | ICD-10-CM | POA: Diagnosis present

## 2021-10-06 DIAGNOSIS — R0902 Hypoxemia: Secondary | ICD-10-CM | POA: Diagnosis present

## 2021-10-06 DIAGNOSIS — R519 Headache, unspecified: Secondary | ICD-10-CM | POA: Diagnosis not present

## 2021-10-06 DIAGNOSIS — N2889 Other specified disorders of kidney and ureter: Secondary | ICD-10-CM | POA: Diagnosis not present

## 2021-10-06 DIAGNOSIS — Z833 Family history of diabetes mellitus: Secondary | ICD-10-CM | POA: Diagnosis not present

## 2021-10-06 DIAGNOSIS — C189 Malignant neoplasm of colon, unspecified: Secondary | ICD-10-CM | POA: Diagnosis not present

## 2021-10-06 DIAGNOSIS — Z6830 Body mass index (BMI) 30.0-30.9, adult: Secondary | ICD-10-CM | POA: Diagnosis not present

## 2021-10-06 DIAGNOSIS — K625 Hemorrhage of anus and rectum: Secondary | ICD-10-CM | POA: Diagnosis not present

## 2021-10-06 DIAGNOSIS — F32A Depression, unspecified: Secondary | ICD-10-CM | POA: Diagnosis present

## 2021-10-06 DIAGNOSIS — D649 Anemia, unspecified: Secondary | ICD-10-CM | POA: Diagnosis not present

## 2021-10-06 DIAGNOSIS — A419 Sepsis, unspecified organism: Secondary | ICD-10-CM | POA: Diagnosis not present

## 2021-10-06 DIAGNOSIS — I2510 Atherosclerotic heart disease of native coronary artery without angina pectoris: Secondary | ICD-10-CM | POA: Diagnosis not present

## 2021-10-06 DIAGNOSIS — R509 Fever, unspecified: Secondary | ICD-10-CM | POA: Diagnosis not present

## 2021-10-06 DIAGNOSIS — I10 Essential (primary) hypertension: Secondary | ICD-10-CM | POA: Diagnosis not present

## 2021-10-06 DIAGNOSIS — J984 Other disorders of lung: Secondary | ICD-10-CM | POA: Diagnosis not present

## 2021-10-06 DIAGNOSIS — M47814 Spondylosis without myelopathy or radiculopathy, thoracic region: Secondary | ICD-10-CM | POA: Diagnosis not present

## 2021-10-06 DIAGNOSIS — F419 Anxiety disorder, unspecified: Secondary | ICD-10-CM | POA: Diagnosis present

## 2021-10-06 DIAGNOSIS — R110 Nausea: Secondary | ICD-10-CM | POA: Diagnosis not present

## 2021-10-06 DIAGNOSIS — D75839 Thrombocytosis, unspecified: Secondary | ICD-10-CM | POA: Diagnosis present

## 2021-10-06 DIAGNOSIS — R778 Other specified abnormalities of plasma proteins: Secondary | ICD-10-CM

## 2021-10-06 DIAGNOSIS — Z20822 Contact with and (suspected) exposure to covid-19: Secondary | ICD-10-CM | POA: Diagnosis not present

## 2021-10-06 DIAGNOSIS — E876 Hypokalemia: Secondary | ICD-10-CM | POA: Diagnosis not present

## 2021-10-06 DIAGNOSIS — C7951 Secondary malignant neoplasm of bone: Secondary | ICD-10-CM | POA: Diagnosis not present

## 2021-10-06 DIAGNOSIS — R630 Anorexia: Secondary | ICD-10-CM | POA: Diagnosis not present

## 2021-10-06 DIAGNOSIS — D259 Leiomyoma of uterus, unspecified: Secondary | ICD-10-CM | POA: Diagnosis not present

## 2021-10-06 DIAGNOSIS — K8020 Calculus of gallbladder without cholecystitis without obstruction: Secondary | ICD-10-CM | POA: Diagnosis not present

## 2021-10-06 DIAGNOSIS — Z800 Family history of malignant neoplasm of digestive organs: Secondary | ICD-10-CM | POA: Diagnosis not present

## 2021-10-06 DIAGNOSIS — C188 Malignant neoplasm of overlapping sites of colon: Secondary | ICD-10-CM | POA: Diagnosis not present

## 2021-10-06 DIAGNOSIS — Z880 Allergy status to penicillin: Secondary | ICD-10-CM

## 2021-10-06 DIAGNOSIS — Z91013 Allergy to seafood: Secondary | ICD-10-CM

## 2021-10-06 DIAGNOSIS — R7989 Other specified abnormal findings of blood chemistry: Secondary | ICD-10-CM

## 2021-10-06 DIAGNOSIS — Z888 Allergy status to other drugs, medicaments and biological substances status: Secondary | ICD-10-CM

## 2021-10-06 LAB — COMPREHENSIVE METABOLIC PANEL
ALT: 10 U/L (ref 0–44)
AST: 14 U/L — ABNORMAL LOW (ref 15–41)
Albumin: 2.5 g/dL — ABNORMAL LOW (ref 3.5–5.0)
Alkaline Phosphatase: 66 U/L (ref 38–126)
Anion gap: 12 (ref 5–15)
BUN: 9 mg/dL (ref 6–20)
CO2: 28 mmol/L (ref 22–32)
Calcium: 8.9 mg/dL (ref 8.9–10.3)
Chloride: 100 mmol/L (ref 98–111)
Creatinine, Ser: 0.52 mg/dL (ref 0.44–1.00)
GFR, Estimated: >60 mL/min (ref >60)
Glucose, Bld: 98 mg/dL (ref 70–99)
Potassium: 2.4 mmol/L — CL (ref 3.5–5.1)
Sodium: 140 mmol/L (ref 135–145)
Total Bilirubin: 0.7 mg/dL (ref 0.3–1.2)
Total Protein: 7.1 g/dL (ref 6.5–8.1)

## 2021-10-06 LAB — CBC WITH DIFFERENTIAL/PLATELET
Abs Immature Granulocytes: 0.03 10*3/uL (ref 0.00–0.07)
Basophils Absolute: 0 10*3/uL (ref 0.0–0.1)
Basophils Relative: 0 %
Eosinophils Absolute: 0 10*3/uL (ref 0.0–0.5)
Eosinophils Relative: 0 %
HCT: 30.9 % — ABNORMAL LOW (ref 36.0–46.0)
Hemoglobin: 9.4 g/dL — ABNORMAL LOW (ref 12.0–15.0)
Immature Granulocytes: 0 %
Lymphocytes Relative: 26 %
Lymphs Abs: 1.8 10*3/uL (ref 0.7–4.0)
MCH: 25.8 pg — ABNORMAL LOW (ref 26.0–34.0)
MCHC: 30.4 g/dL (ref 30.0–36.0)
MCV: 84.7 fL (ref 80.0–100.0)
Monocytes Absolute: 0.3 10*3/uL (ref 0.1–1.0)
Monocytes Relative: 4 %
Neutro Abs: 4.8 10*3/uL (ref 1.7–7.7)
Neutrophils Relative %: 70 %
Platelets: 680 10*3/uL — ABNORMAL HIGH (ref 150–400)
RBC: 3.65 MIL/uL — ABNORMAL LOW (ref 3.87–5.11)
RDW: 17.2 % — ABNORMAL HIGH (ref 11.5–15.5)
WBC: 6.9 10*3/uL (ref 4.0–10.5)
nRBC: 0 % (ref 0.0–0.2)

## 2021-10-06 LAB — URINALYSIS, ROUTINE W REFLEX MICROSCOPIC
Bacteria, UA: NONE SEEN
Bilirubin Urine: NEGATIVE
Glucose, UA: NEGATIVE mg/dL
Hgb urine dipstick: NEGATIVE
Ketones, ur: NEGATIVE mg/dL
Leukocytes,Ua: NEGATIVE
Nitrite: NEGATIVE
Protein, ur: 30 mg/dL — AB
Specific Gravity, Urine: 1.018 (ref 1.005–1.030)
pH: 8 (ref 5.0–8.0)

## 2021-10-06 LAB — APTT: aPTT: 29 seconds (ref 24–36)

## 2021-10-06 LAB — PROTIME-INR
INR: 1.1 (ref 0.8–1.2)
Prothrombin Time: 13.8 seconds (ref 11.4–15.2)

## 2021-10-06 LAB — TROPONIN I (HIGH SENSITIVITY): Troponin I (High Sensitivity): 34 ng/L — ABNORMAL HIGH (ref <18)

## 2021-10-06 LAB — LACTIC ACID, PLASMA: Lactic Acid, Venous: 1 mmol/L (ref 0.5–1.9)

## 2021-10-06 MED ORDER — AMLODIPINE BESYLATE 5 MG PO TABS
10.0000 mg | ORAL_TABLET | Freq: Once | ORAL | Status: AC
Start: 2021-10-06 — End: 2021-10-06
  Administered 2021-10-06: 10 mg via ORAL
  Filled 2021-10-06: qty 2

## 2021-10-06 MED ORDER — METOPROLOL TARTRATE 50 MG PO TABS
50.0000 mg | ORAL_TABLET | Freq: Two times a day (BID) | ORAL | Status: DC
Start: 2021-10-06 — End: 2021-10-09
  Administered 2021-10-06 – 2021-10-09 (×6): 50 mg via ORAL
  Filled 2021-10-06 (×5): qty 1
  Filled 2021-10-06: qty 2
  Filled 2021-10-06: qty 1

## 2021-10-06 MED ORDER — LORAZEPAM 2 MG/ML IJ SOLN
1.0000 mg | Freq: Once | INTRAMUSCULAR | Status: AC
  Administered 2021-10-06: 1 mg via INTRAVENOUS
  Filled 2021-10-06: qty 1

## 2021-10-06 MED ORDER — POTASSIUM CHLORIDE CRYS ER 20 MEQ PO TBCR
40.0000 meq | EXTENDED_RELEASE_TABLET | Freq: Once | ORAL | Status: AC
  Administered 2021-10-07: 40 meq via ORAL
  Filled 2021-10-06: qty 2

## 2021-10-06 MED ORDER — VANCOMYCIN HCL 1500 MG/300ML IV SOLN
1500.0000 mg | Freq: Once | INTRAVENOUS | Status: AC
Start: 2021-10-06 — End: 2021-10-07
  Administered 2021-10-07: 1500 mg via INTRAVENOUS
  Filled 2021-10-06: qty 300

## 2021-10-06 MED ORDER — LORAZEPAM 1 MG PO TABS
1.0000 mg | ORAL_TABLET | Freq: Once | ORAL | Status: AC
  Administered 2021-10-06: 1 mg via ORAL
  Filled 2021-10-06: qty 1

## 2021-10-06 MED ORDER — SODIUM CHLORIDE 0.9 % IV SOLN
2.0000 g | Freq: Once | INTRAVENOUS | Status: AC
  Administered 2021-10-06: 2 g via INTRAVENOUS
  Filled 2021-10-06: qty 12.5

## 2021-10-06 MED ORDER — SODIUM CHLORIDE 0.9 % IV BOLUS
1000.0000 mL | Freq: Once | INTRAVENOUS | Status: AC
  Administered 2021-10-06: 1000 mL via INTRAVENOUS

## 2021-10-06 MED ORDER — POTASSIUM CHLORIDE 10 MEQ/100ML IV SOLN
10.0000 meq | INTRAVENOUS | Status: AC
  Filled 2021-10-06: qty 100

## 2021-10-06 NOTE — ED Provider Triage Note (Signed)
Emergency Medicine Provider Triage Evaluation Note ? ?[name] , a 36 y.o. female  was evaluated in triage.  Pt complains of vomiting and fast heart rate since this morning. Hx of colon cancer, contacted oncologist (Dr. Burr Medico) feeling like her "blood counts were low".  ? ?Review of Systems  ?Positive: Fatigue, chills, vomiting ?Negative: Abdominal pain, chest pain, SOB ? ?Physical Exam  ?BP (!) 147/120 (BP Location: Left Arm)   Pulse (!) 136   Temp 98.4 ?F (36.9 ?C) (Oral)   Resp 16   Ht '5\' 3"'$  (1.6 m)   Wt 77.1 kg   BMI 30.11 kg/m?  ?Gen:   Awake, no distress   ?Resp:  Normal effort  ?MSK:   Moves extremities without difficulty  ?Other:   ? ?Medical Decision Making  ?Medically screening exam initiated at 5:03 PM.  Appropriate orders placed.  DEVA RON was informed that the remainder of the evaluation will be completed by another provider, this initial triage assessment does not replace that evaluation, and the importance of remaining in the ED until their evaluation is complete. ? ? ?  ?Kateri Plummer, PA-C ?10/06/21 1703 ? ?

## 2021-10-06 NOTE — ED Provider Notes (Signed)
?[facility] DEPT ?Provider Note ? ? ?CSN: [id number] ?Arrival date & time: 10/06/21  1551 ? ?  ? ?History ? ?Chief Complaint  ?Patient presents with  ? Emesis  ? Dizziness  ? ? ?Mikayla Rios is a 36 y.o. 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  She denies cough, congestion, dysuria, hematuria. ? ?HPI ? ?  ? ?Home Medications ?Prior to Admission medications   ?Medication Sig Start Date End Date Taking? Authorizing Provider  ?amLODipine (NORVASC) 10 MG tablet Take 10 mg by mouth every evening. 05/26/21  Yes [provider]  ?DULoxetine (CYMBALTA) 30 MG capsule Take 1 capsule (30 mg total) by mouth daily. 09/14/21  Yes [name], [name], NP  ?metoprolol tartrate (LOPRESSOR) 50 MG tablet Take 1 tablet (50 mg total) by mouth 2 (two) times daily. 07/14/21  Yes [name], MD  ?diclofenac Sodium (VOLTAREN) 1 % GEL Apply 4 g topically 4 (four) times daily as needed (for mild pain). ?Patient not taking: Reported on 10/06/2021 05/14/21   Cathlyn Parsons, PA-C  ?gabapentin (NEURONTIN) 300 MG capsule Take 2 capsules (600 mg total) by mouth 3 (three) times daily. ?Patient not taking: Reported on 10/06/2021 09/14/21   [name], [name], NP  ?heparin lock flush 100 UNIT/ML SOLN injection Use 1 syringe (into each lumen) twice daily ?Patient not taking: Reported on 10/06/2021 06/30/21   [name], MD  ?HYDROmorphone (DILAUDID) 2 MG tablet Take 1-2 tablets (2-4 mg total) by mouth every 4 (four) hours as needed for severe pain or moderate pain. ?Patient not taking: Reported on 10/06/2021 09/23/21   [name], [name], NP  ?lidocaine-prilocaine (EMLA) cream Apply a dime size amount of cream to [facility] prior 45 mins to 1 hr prior to Port-a-cath access as directed. 09/29/21   [name], MD  ?morphine (MS CONTIN) 30 MG 12 hr tablet Take 1 tablet (30 mg total) by mouth every 8 (eight) hours. ?Patient not taking: Reported on 10/06/2021 09/16/21   [name], [name], NP  ?potassium chloride SA (KLOR-CON M20) 20 MEQ tablet Take 1 tablet (20 mEq total) by mouth 2 (two) times daily. 10/06/21   [name], MD  ?Sodium Chloride Flush (NORMAL SALINE FLUSH) 0.9 % SOLN Inject 10 mLs (1 syringe) into lumen twice daily as directed ?Patient not taking: Reported on 10/06/2021 06/30/21   [name], MD  ?prochlorperazine (COMPAZINE) 10 MG tablet Take 1 tablet (10 mg total) by mouth every 6 (six) hours as needed (Nausea or vomiting). 05/25/21 08/31/21  [name], MD  ?   ? ?Allergies    ?Chlorhexidine gluconate, Shellfish allergy, and Penicillins   ? ?Review of Systems   ?Review of Systems ? ?Physical Exam ?Updated Vital Signs ?BP (!) 155/110   Pulse (!) 144   Temp 98.4 ?F (36.9 ?C) (Oral)   Resp 20   Ht '5\' 3"'$  (1.6 m)   Wt 77.1 kg   SpO2 100%   BMI 30.11 kg/m?  ?Physical Exam ?Constitutional:   ?   General: She is not in acute distress. ?HENT:  ?   Head: Normocephalic and atraumatic.  ?Eyes:  ?   Conjunctiva/sclera: Conjunctivae normal.  ?   Pupils: Pupils are equal, round, and reactive to light.  ?Cardiovascular:  ?  Rate and Rhythm: Regular rhythm. Tachycardia present.  ?   Comments: Chest wall port ?Pulmonary:  ?   Effort: Pulmonary effort is normal. No respiratory distress.  ?Abdominal:  ?   General: There is no distension.  ?   Tenderness: There is no abdominal tenderness.  ?Skin: ?   General: Skin is warm and dry.  ?Neurological:  ?   General: No focal deficit present.  ?   Mental Status: She is alert. Mental status is at baseline.  ?Psychiatric:     ?   Mood and Affect: Mood normal.     ?   Behavior: Behavior normal.  ? ? ?ED Results / Procedures / Treatments   ?Labs ?(all labs ordered are listed, but only abnormal  results are displayed) ?Labs Reviewed  ?COMPREHENSIVE METABOLIC PANEL - Abnormal; Notable for the following components:  ?    Result Value  ? Potassium 2.4 (*)   ? Albumin 2.5 (*)   ? AST 14 (*)   ? All other components within normal limits  ?URINALYSIS, ROUTINE W REFLEX MICROSCOPIC - Abnormal; Notable for the following components:  ? Color, Urine AMBER (*)   ? APPearance HAZY (*)   ? Protein, ur 30 (*)   ? All other components within normal limits  ?CBC WITH DIFFERENTIAL/PLATELET - Abnormal; Notable for the following components:  ? RBC 3.65 (*)   ? Hemoglobin 9.4 (*)   ? HCT 30.9 (*)   ? MCH 25.8 (*)   ? RDW 17.2 (*)   ? Platelets 680 (*)   ? All other components within normal limits  ?TROPONIN I (HIGH SENSITIVITY) - Abnormal; Notable for the following components:  ? Troponin I (High Sensitivity) 34 (*)   ? All other components within normal limits  ?CULTURE, BLOOD (ROUTINE X 2)  ?CULTURE, BLOOD (ROUTINE X 2)  ?LACTIC ACID, PLASMA  ?PROTIME-INR  ?APTT  ?LACTIC ACID, PLASMA  ?I-STAT BETA HCG BLOOD, ED ([facility], [facility], [facility] ONLY)  ?I-STAT CHEM 8, ED  ?TROPONIN I (HIGH SENSITIVITY)  ? ? ?EKG ?None ? ?Radiology ?DG Chest Port 1 View ? ?Result Date: 10/06/2021 ?CLINICAL DATA:  Questionable sepsis. EXAM: PORTABLE CHEST 1 VIEW COMPARISON:  Chest x-ray 04/15/2021 FINDINGS: Right chest port catheter tip projects over the mid SVC. The heart size and mediastinal contours are within normal limits. Both lungs are clear. The visualized skeletal structures are unremarkable. IMPRESSION: No active disease. Electronically Signed   By: Ronney Asters M.D.   On: 10/06/2021 19:13   ? ?Procedures ?Marland KitchenCritical Care ?Performed by: Wyvonnia Dusky, MD ?Authorized by: Wyvonnia Dusky, MD  ? ?Critical care provider statement:  ?  Critical care time (minutes):  45 ?  Critical care time was exclusive of:  Separately billable procedures and treating other patients ?  Critical care was necessary to treat or prevent imminent or life-threatening  deterioration of the following conditions:  Sepsis ?  Critical care was time spent personally by me on the following activities:  Ordering and performing treatments and interventions, ordering and review of laboratory studies, ordering and review of radiographic studies, pulse oximetry, review of old charts, examination of patient and evaluation of patient's response to treatment  ? ? ?Medications Ordered in ED ?Medications  ?potassium chloride 10 mEq in 100 mL IVPB (has no administration in time range)  ?potassium chloride SA (KLOR-CON M) CR tablet 40 mEq (has no administration in time range)  ?vancomycin (VANCOREADY) IVPB 1500 mg/300 mL (has no administration in time range)  ?  ceFEPIme (MAXIPIME) 2 g in sodium chloride 0.9 % 100 mL IVPB (2 g Intravenous New Bag/Given 10/06/21 2344)  ?metoprolol tartrate (LOPRESSOR) tablet 50 mg (50 mg Oral Given 10/06/21 2343)  ?LORazepam (ATIVAN) tablet 1 mg (1 mg Oral Given 10/06/21 1831)  ?sodium chloride 0.9 % bolus 1,000 mL (1,000 mLs Intravenous New Bag/Given 10/06/21 2341)  ?LORazepam (ATIVAN) injection 1 mg (1 mg Intravenous Given 10/06/21 2342)  ?amLODipine (NORVASC) tablet 10 mg (10 mg Oral Given 10/06/21 2343)  ? ? ?ED Course/ Medical Decision Making/ A&P ?Clinical Course as of 10/06/21 2346  ?Tue Oct 06, 2021  ?2311 Hgb stable, hypokalemic [MT]  ?2312 Patient remains tachycardic, broad-spectrum antibiotics ordered is not a clear source of infection, may be related to her peripheral line.  IV vancomycin and Zosyn ordered. [MT]  ?2335 Clarified with patient she has missed her evening metoprolol 50 mg, which is now ordered, and she has baseline resting tachycardia.  Hospitalist Dr Marlowe Sax requested CT chest/abd/pelvis for infection evaluation given prior hx of abdominal abscess (2022) - hospitalist can be contacted again after imaging for admission.  Pt agreeable with admission plan. [MT]  ?2343 Pt signed out to Dr Dayna Barker pending f/u on CT imaging and medical admission. [MT]  ?   ?Clinical Course User Index ?[MT] Wyvonnia Dusky, MD  ? ?                        ?Medical Decision Making ?Amount and/or Complexity of Data Reviewed ?Labs: ordered. ?Radiology: ordered. ? ?Risk ?Prescription drug management.

## 2021-10-06 NOTE — ED Provider Notes (Signed)
11:40 PM ?Assumed care from Dr. Langston Masker, please see their note for full history, physical and decision making until this point. In brief this is a 36 y.o. year old female who presented to the ED tonight with Emesis and Dizziness ?    ?Had already spoken with Dr. Marlowe Sax, requesting CT CAP for abscess eval. Vanc cefepime given. HR often elevated, on beta blocker. Also hypokalemic. Plan for repletion, ct scans and call [place] back.  ? ?Discharge instructions, including strict return precautions for new or worsening symptoms, given. Patient and/or family verbalized understanding and agreement with the plan as described.  ? ?Labs, studies and imaging reviewed by myself and considered in medical decision making if ordered. Imaging interpreted by radiology. ? ?Labs Reviewed  ?COMPREHENSIVE METABOLIC PANEL - Abnormal; Notable for the following components:  ?    Result Value  ? Potassium 2.4 (*)   ? Albumin 2.5 (*)   ? AST 14 (*)   ? All other components within normal limits  ?URINALYSIS, ROUTINE W REFLEX MICROSCOPIC - Abnormal; Notable for the following components:  ? Color, Urine AMBER (*)   ? APPearance HAZY (*)   ? Protein, ur 30 (*)   ? All other components within normal limits  ?CBC WITH DIFFERENTIAL/PLATELET - Abnormal; Notable for the following components:  ? RBC 3.65 (*)   ? Hemoglobin 9.4 (*)   ? HCT 30.9 (*)   ? MCH 25.8 (*)   ? RDW 17.2 (*)   ? Platelets 680 (*)   ? All other components within normal limits  ?TROPONIN I (HIGH SENSITIVITY) - Abnormal; Notable for the following components:  ? Troponin I (High Sensitivity) 34 (*)   ? All other components within normal limits  ?CULTURE, BLOOD (ROUTINE X 2)  ?CULTURE, BLOOD (ROUTINE X 2)  ?LACTIC ACID, PLASMA  ?PROTIME-INR  ?APTT  ?LACTIC ACID, PLASMA  ?I-STAT BETA HCG BLOOD, ED ([facility], [facility], [facility] ONLY)  ?I-STAT CHEM 8, ED  ?TROPONIN I (HIGH SENSITIVITY)  ? ? ?DG Chest Port 1 View  ?Final Result  ?  ?CT CHEST ABDOMEN PELVIS W CONTRAST    (Results Pending)  ? ? ?No  follow-ups on file. ? ?  ?Mikayla Rios, Corene Cornea, MD ?10/08/21 0500 ? ?

## 2021-10-06 NOTE — ED Notes (Signed)
I attempted to collect labs and did not feel or see anywhere to collect from.  Patient has a port. ?

## 2021-10-06 NOTE — Telephone Encounter (Signed)
Pt LVM stating her hands and feet are cold but she does not have a fever.  Pt stated she has a hx of anemia and feels her blood counts maybe low.  Pt stated she will probably go the the ED if she do not hear back.  Tried calling pt back and saw where pt is in the ED.  Notified Dr. Burr Medico. ?

## 2021-10-06 NOTE — ED Triage Notes (Signed)
Patient reports that she is currently receiving chemo for colon cancer. Patient c/o chills. Last Hgb- 8.1 on 4/25. Patient also had a port placed 4 days ago. ? ?HR- 135 and BP-147/120. Patient states she has taken her BP med this AM. ? ?

## 2021-10-06 NOTE — ED Notes (Signed)
Critical lab result from [place] in lab - potassium of 2.4 - notified MD ?

## 2021-10-07 ENCOUNTER — Emergency Department (HOSPITAL_COMMUNITY): Payer: BC Managed Care – PPO

## 2021-10-07 ENCOUNTER — Encounter (HOSPITAL_COMMUNITY): Payer: Self-pay

## 2021-10-07 ENCOUNTER — Other Ambulatory Visit (HOSPITAL_COMMUNITY): Payer: Self-pay

## 2021-10-07 ENCOUNTER — Telehealth: Payer: Self-pay | Admitting: Hematology

## 2021-10-07 ENCOUNTER — Other Ambulatory Visit: Payer: Self-pay

## 2021-10-07 DIAGNOSIS — Z833 Family history of diabetes mellitus: Secondary | ICD-10-CM | POA: Diagnosis not present

## 2021-10-07 DIAGNOSIS — A419 Sepsis, unspecified organism: Secondary | ICD-10-CM | POA: Insufficient documentation

## 2021-10-07 DIAGNOSIS — R5382 Chronic fatigue, unspecified: Secondary | ICD-10-CM | POA: Diagnosis present

## 2021-10-07 DIAGNOSIS — E669 Obesity, unspecified: Secondary | ICD-10-CM | POA: Diagnosis present

## 2021-10-07 DIAGNOSIS — I248 Other forms of acute ischemic heart disease: Secondary | ICD-10-CM | POA: Diagnosis present

## 2021-10-07 DIAGNOSIS — I10 Essential (primary) hypertension: Secondary | ICD-10-CM | POA: Diagnosis present

## 2021-10-07 DIAGNOSIS — R778 Other specified abnormalities of plasma proteins: Secondary | ICD-10-CM | POA: Diagnosis not present

## 2021-10-07 DIAGNOSIS — F32A Depression, unspecified: Secondary | ICD-10-CM | POA: Diagnosis present

## 2021-10-07 DIAGNOSIS — C189 Malignant neoplasm of colon, unspecified: Secondary | ICD-10-CM | POA: Diagnosis present

## 2021-10-07 DIAGNOSIS — D649 Anemia, unspecified: Secondary | ICD-10-CM | POA: Diagnosis present

## 2021-10-07 DIAGNOSIS — Z8249 Family history of ischemic heart disease and other diseases of the circulatory system: Secondary | ICD-10-CM | POA: Diagnosis not present

## 2021-10-07 DIAGNOSIS — R0902 Hypoxemia: Secondary | ICD-10-CM | POA: Diagnosis present

## 2021-10-07 DIAGNOSIS — D75839 Thrombocytosis, unspecified: Secondary | ICD-10-CM | POA: Diagnosis present

## 2021-10-07 DIAGNOSIS — Z6830 Body mass index (BMI) 30.0-30.9, adult: Secondary | ICD-10-CM | POA: Diagnosis not present

## 2021-10-07 DIAGNOSIS — C188 Malignant neoplasm of overlapping sites of colon: Secondary | ICD-10-CM | POA: Diagnosis not present

## 2021-10-07 DIAGNOSIS — E876 Hypokalemia: Secondary | ICD-10-CM

## 2021-10-07 DIAGNOSIS — R6510 Systemic inflammatory response syndrome (SIRS) of non-infectious origin without acute organ dysfunction: Secondary | ICD-10-CM | POA: Diagnosis present

## 2021-10-07 DIAGNOSIS — R509 Fever, unspecified: Secondary | ICD-10-CM | POA: Diagnosis present

## 2021-10-07 DIAGNOSIS — Z85038 Personal history of other malignant neoplasm of large intestine: Secondary | ICD-10-CM

## 2021-10-07 DIAGNOSIS — K625 Hemorrhage of anus and rectum: Secondary | ICD-10-CM | POA: Diagnosis not present

## 2021-10-07 DIAGNOSIS — Z20822 Contact with and (suspected) exposure to covid-19: Secondary | ICD-10-CM | POA: Diagnosis present

## 2021-10-07 DIAGNOSIS — E119 Type 2 diabetes mellitus without complications: Secondary | ICD-10-CM | POA: Diagnosis present

## 2021-10-07 DIAGNOSIS — F419 Anxiety disorder, unspecified: Secondary | ICD-10-CM | POA: Diagnosis present

## 2021-10-07 DIAGNOSIS — G4730 Sleep apnea, unspecified: Secondary | ICD-10-CM | POA: Diagnosis present

## 2021-10-07 DIAGNOSIS — D696 Thrombocytopenia, unspecified: Secondary | ICD-10-CM | POA: Diagnosis not present

## 2021-10-07 DIAGNOSIS — R000 Tachycardia, unspecified: Secondary | ICD-10-CM | POA: Diagnosis present

## 2021-10-07 DIAGNOSIS — Z800 Family history of malignant neoplasm of digestive organs: Secondary | ICD-10-CM | POA: Diagnosis not present

## 2021-10-07 DIAGNOSIS — C7951 Secondary malignant neoplasm of bone: Secondary | ICD-10-CM | POA: Diagnosis present

## 2021-10-07 DIAGNOSIS — Z933 Colostomy status: Secondary | ICD-10-CM | POA: Diagnosis not present

## 2021-10-07 LAB — I-STAT CHEM 8, ED
BUN: 7 mg/dL (ref 6–20)
Calcium, Ion: 1.13 mmol/L — ABNORMAL LOW (ref 1.15–1.40)
Chloride: 99 mmol/L (ref 98–111)
Creatinine, Ser: 0.4 mg/dL — ABNORMAL LOW (ref 0.44–1.00)
Glucose, Bld: 99 mg/dL (ref 70–99)
HCT: 26 % — ABNORMAL LOW (ref 36.0–46.0)
Hemoglobin: 8.8 g/dL — ABNORMAL LOW (ref 12.0–15.0)
Potassium: 2.5 mmol/L — CL (ref 3.5–5.1)
Sodium: 140 mmol/L (ref 135–145)
TCO2: 28 mmol/L (ref 22–32)

## 2021-10-07 LAB — CBC
HCT: 27.3 % — ABNORMAL LOW (ref 36.0–46.0)
HCT: 25.9 % — ABNORMAL LOW (ref 36.0–46.0)
Hemoglobin: 8 g/dL — ABNORMAL LOW (ref 12.0–15.0)
Hemoglobin: 7.8 g/dL — ABNORMAL LOW (ref 12.0–15.0)
MCH: 25.4 pg — ABNORMAL LOW (ref 26.0–34.0)
MCH: 25.9 pg — ABNORMAL LOW (ref 26.0–34.0)
MCHC: 29.3 g/dL — ABNORMAL LOW (ref 30.0–36.0)
MCHC: 30.1 g/dL (ref 30.0–36.0)
MCV: 86.7 fL (ref 80.0–100.0)
MCV: 86 fL (ref 80.0–100.0)
Platelets: 570 10*3/uL — ABNORMAL HIGH (ref 150–400)
Platelets: 519 10*3/uL — ABNORMAL HIGH (ref 150–400)
RBC: 3.15 MIL/uL — ABNORMAL LOW (ref 3.87–5.11)
RBC: 3.01 MIL/uL — ABNORMAL LOW (ref 3.87–5.11)
RDW: 17.2 % — ABNORMAL HIGH (ref 11.5–15.5)
RDW: 17.3 % — ABNORMAL HIGH (ref 11.5–15.5)
WBC: 7.2 10*3/uL (ref 4.0–10.5)
WBC: 6.6 10*3/uL (ref 4.0–10.5)
nRBC: 0 % (ref 0.0–0.2)
nRBC: 0 % (ref 0.0–0.2)

## 2021-10-07 LAB — TROPONIN I (HIGH SENSITIVITY)
Troponin I (High Sensitivity): 50 ng/L — ABNORMAL HIGH (ref <18)
Troponin I (High Sensitivity): 64 ng/L — ABNORMAL HIGH (ref <18)

## 2021-10-07 LAB — RESP PANEL BY RT-PCR (FLU A&B, COVID) ARPGX2
Influenza A by PCR: NEGATIVE
Influenza B by PCR: NEGATIVE
SARS Coronavirus 2 by RT PCR: NEGATIVE

## 2021-10-07 LAB — BASIC METABOLIC PANEL
Anion gap: 9 (ref 5–15)
BUN: 8 mg/dL (ref 6–20)
CO2: 27 mmol/L (ref 22–32)
Calcium: 8.3 mg/dL — ABNORMAL LOW (ref 8.9–10.3)
Chloride: 104 mmol/L (ref 98–111)
Creatinine, Ser: 0.43 mg/dL — ABNORMAL LOW (ref 0.44–1.00)
GFR, Estimated: >60 mL/min (ref >60)
Glucose, Bld: 90 mg/dL (ref 70–99)
Potassium: 2.6 mmol/L — CL (ref 3.5–5.1)
Sodium: 140 mmol/L (ref 135–145)

## 2021-10-07 LAB — I-STAT BETA HCG BLOOD, ED (MC, WL, AP ONLY): I-stat hCG, quantitative: <5 m[IU]/mL (ref <5)

## 2021-10-07 LAB — MAGNESIUM: Magnesium: 1.6 mg/dL — ABNORMAL LOW (ref 1.7–2.4)

## 2021-10-07 LAB — HCG, QUANTITATIVE, PREGNANCY: hCG, Beta Chain, Quant, S: 1 m[IU]/mL (ref <5)

## 2021-10-07 LAB — PROCALCITONIN: Procalcitonin: <0.1 ng/mL

## 2021-10-07 LAB — LACTIC ACID, PLASMA: Lactic Acid, Venous: 1.4 mmol/L (ref 0.5–1.9)

## 2021-10-07 MED ORDER — POTASSIUM CHLORIDE CRYS ER 20 MEQ PO TBCR
40.0000 meq | EXTENDED_RELEASE_TABLET | Freq: Once | ORAL | Status: AC
  Administered 2021-10-07: 40 meq via ORAL
  Filled 2021-10-07: qty 2

## 2021-10-07 MED ORDER — ONDANSETRON HCL 4 MG/2ML IJ SOLN
INTRAMUSCULAR | Status: AC
  Administered 2021-10-07: 4 mg via INTRAVENOUS
  Filled 2021-10-07: qty 2

## 2021-10-07 MED ORDER — SODIUM CHLORIDE 0.9 % IV SOLN: INTRAVENOUS | Status: AC

## 2021-10-07 MED ORDER — ACETAMINOPHEN 325 MG PO TABS
650.0000 mg | ORAL_TABLET | Freq: Four times a day (QID) | ORAL | Status: DC | PRN
  Administered 2021-10-07: 650 mg via ORAL
  Filled 2021-10-07 (×3): qty 2

## 2021-10-07 MED ORDER — ENOXAPARIN SODIUM 40 MG/0.4ML IJ SOSY: 40.0000 mg | PREFILLED_SYRINGE | INTRAMUSCULAR | Status: DC

## 2021-10-07 MED ORDER — ONDANSETRON HCL 4 MG/2ML IJ SOLN: 4.0000 mg | Freq: Once | INTRAMUSCULAR | Status: AC

## 2021-10-07 MED ORDER — SODIUM CHLORIDE 0.9% FLUSH
10.0000 mL | INTRAVENOUS | Status: DC | PRN
  Administered 2021-10-11: 10 mL

## 2021-10-07 MED ORDER — SODIUM CHLORIDE 0.9 % IV BOLUS
1000.0000 mL | Freq: Once | INTRAVENOUS | Status: AC
  Administered 2021-10-07: 1000 mL via INTRAVENOUS

## 2021-10-07 MED ORDER — POTASSIUM CHLORIDE 10 MEQ/100ML IV SOLN
10.0000 meq | INTRAVENOUS | Status: AC
  Administered 2021-10-07 (×3): 10 meq via INTRAVENOUS
  Filled 2021-10-07 (×3): qty 100

## 2021-10-07 MED ORDER — METRONIDAZOLE 500 MG/100ML IV SOLN
500.0000 mg | Freq: Two times a day (BID) | INTRAVENOUS | Status: DC
  Administered 2021-10-07 – 2021-10-09 (×5): 500 mg via INTRAVENOUS
  Filled 2021-10-07 (×5): qty 100

## 2021-10-07 MED ORDER — IOHEXOL 300 MG/ML  SOLN
100.0000 mL | Freq: Once | INTRAMUSCULAR | Status: AC | PRN
  Administered 2021-10-07: 100 mL via INTRAVENOUS

## 2021-10-07 MED ORDER — ACETAMINOPHEN 650 MG RE SUPP: 650.0000 mg | Freq: Four times a day (QID) | RECTAL | Status: DC | PRN

## 2021-10-07 MED ORDER — MAGNESIUM SULFATE 2 GM/50ML IV SOLN
2.0000 g | Freq: Once | INTRAVENOUS | Status: AC
  Administered 2021-10-07: 2 g via INTRAVENOUS
  Filled 2021-10-07: qty 50

## 2021-10-07 MED ORDER — POTASSIUM CHLORIDE 10 MEQ/100ML IV SOLN
10.0000 meq | INTRAVENOUS | Status: AC
  Administered 2021-10-07 (×2): 10 meq via INTRAVENOUS
  Filled 2021-10-07 (×2): qty 100

## 2021-10-07 MED ORDER — SODIUM CHLORIDE (PF) 0.9 % IJ SOLN
INTRAMUSCULAR | Status: AC
  Filled 2021-10-07: qty 50

## 2021-10-07 MED ORDER — KETOROLAC TROMETHAMINE 30 MG/ML IJ SOLN
30.0000 mg | Freq: Once | INTRAMUSCULAR | Status: AC
  Administered 2021-10-07: 30 mg via INTRAVENOUS
  Filled 2021-10-07: qty 1

## 2021-10-07 MED ORDER — SODIUM CHLORIDE 0.9 % IV SOLN
2.0000 g | Freq: Three times a day (TID) | INTRAVENOUS | Status: DC
  Administered 2021-10-07 – 2021-10-08 (×6): 2 g via INTRAVENOUS
  Filled 2021-10-07 (×9): qty 12.5

## 2021-10-07 MED ORDER — POTASSIUM CHLORIDE 10 MEQ/100ML IV SOLN
10.0000 meq | Freq: Once | INTRAVENOUS | Status: AC
  Administered 2021-10-07: 10 meq via INTRAVENOUS
  Filled 2021-10-07: qty 100

## 2021-10-07 MED ORDER — MAGNESIUM OXIDE -MG SUPPLEMENT 400 (240 MG) MG PO TABS
400.0000 mg | ORAL_TABLET | Freq: Two times a day (BID) | ORAL | Status: DC
  Administered 2021-10-07 – 2021-10-11 (×9): 400 mg via ORAL
  Filled 2021-10-07 (×9): qty 1

## 2021-10-07 MED ORDER — KETOROLAC TROMETHAMINE 15 MG/ML IJ SOLN
15.0000 mg | Freq: Once | INTRAMUSCULAR | Status: AC
  Administered 2021-10-07: 15 mg via INTRAVENOUS
  Filled 2021-10-07: qty 1

## 2021-10-07 MED ORDER — PROCHLORPERAZINE EDISYLATE 10 MG/2ML IJ SOLN
5.0000 mg | Freq: Four times a day (QID) | INTRAMUSCULAR | Status: DC | PRN
  Administered 2021-10-07: 5 mg via INTRAVENOUS
  Filled 2021-10-07: qty 2

## 2021-10-07 MED ORDER — POTASSIUM CHLORIDE 10 MEQ/100ML IV SOLN
10.0000 meq | INTRAVENOUS | Status: DC
  Administered 2021-10-07: 10 meq via INTRAVENOUS

## 2021-10-07 MED ORDER — VANCOMYCIN HCL 1250 MG/250ML IV SOLN
1250.0000 mg | INTRAVENOUS | Status: DC
  Administered 2021-10-08 – 2021-10-09 (×2): 1250 mg via INTRAVENOUS
  Filled 2021-10-07 (×2): qty 250

## 2021-10-07 MED ORDER — DULOXETINE HCL 30 MG PO CPEP
30.0000 mg | ORAL_CAPSULE | Freq: Every day | ORAL | Status: DC
  Administered 2021-10-07 – 2021-10-11 (×5): 30 mg via ORAL
  Filled 2021-10-07 (×5): qty 1

## 2021-10-07 MED ORDER — BUTALBITAL-APAP-CAFFEINE 50-325-40 MG PO TABS
1.0000 | ORAL_TABLET | Freq: Once | ORAL | Status: DC
  Filled 2021-10-07: qty 1

## 2021-10-07 NOTE — Progress Notes (Addendum)
Same day note ? ?Patient seen and examined at bedside. ? ?Patient was admitted to the hospital for fevers ? ?At the time of my evaluation, patient complains of headache.  Had nausea vomiting yesterday but did not denies today. ? ?Physical examination reveals female not in obvious distress, obese, port site appears healthy.  Chest without wheezing.  Abdomen soft ? ?Laboratory data and imaging was reviewed, ? ?Assessment and Plan. ?  ?SIRS/ possible sepsis ?No obvious source of infection at this time.  Chest x-ray CT scan of the chest without any pneumonia.  Urinalysis negative for infection.  On vancomycin Flagyl and cefepime for broad-spectrum coverage due to immunocompromised status.  Received 2 L of IV fluid bolus.  Continue hydration.  Follow blood cultures.  COVID and influenza was negative.  Procalcitonin was less than 0.10.  Temperature max of 98.7 ?F.  Patient is still tachycardic and tachypneic.  We will continue to monitor.  De-escalate antibiotic by tomorrow if negative cultures. ?  ?Nausea and vomiting ?Of the abdomen and pelvis without any acute findings.  No nausea and vomiting today.  Feels better. ?  ?Sinus tachycardia ?CT chest with contrast without any PE.  Patient is not hypoxic.  Continue with IV hydration.  Continue metoprolol from home. ?  ?Severe hypokalemia ? secondary to poor oral intake and vomiting.  We will continue with IV potassium supplements aggressively.  Add p.o. potassium as well.  Replace magnesium.  Check BMP in AM. ? ?Hypomagnesemia. ?We will give magnesium sulfate IV and orally.  Check levels in a.m. ? ?Mild troponin elevation ?Likely due to demand ischemia from sepsis.  High-sensitivity troponin 34 > 50.  No chest pain or EKG changes.   ?  ?Stage II colon cancer ?Status post small bowel and left colon resection in November 2022, currently on immunotherapy.  CT chest metastatic disease.  CT abdomen pelvis showing stable metastatic disease.  Will recommend outpatient oncology  follow-up. ?  ?Hypertension ?Amlodipine on hold.  Continue metoprolol from home. ? ?Anxiety and depression ?-Continue Cymbalta ? ?Mild rectal bleed. Will monitor closely. Check hemoglobin in am. Add PPI. Might need GI evaluation if ongoing bleed. ?  ? ?No Charge ? ?Signed, ? ?Delila Pereyra, MD ?[facility] ? ?

## 2021-10-07 NOTE — Telephone Encounter (Signed)
Unable to leave message with follow-up appointment per 4/25 los. Mailed calendar. ?

## 2021-10-07 NOTE — Progress Notes (Signed)
A consult was received from an ED physician for vancomycin and cefepime per pharmacy dosing.  The patient's profile has been reviewed for ht/wt/allergies/indication/available labs.   ?A one time order has been placed for vancomycin '1500mg'$  and cefepime 2gm.   ? ?Further antibiotics/pharmacy consults should be ordered by admitting physician if indicated.       ?                ?Thank you, ?Dolly Rias RPh ?10/07/2021, 12:21 AM ? ?

## 2021-10-07 NOTE — H&P (Addendum)
?History and Physical  ? ? ?Mikayla Rios PXT:[id number] DOB: 09-Apr-1986 DOA: 10/06/2021 ? ?PCP: Mikayla Chard, MD ? ?Patient coming from: Home ? ?Chief Complaint: Fevers ? ?HPI: KIYAH Rios is a 36 y.o. 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  In the ED, patient tachycardic and tachypneic.  Afebrile and not hypotensive or hypoxic.  Labs showing WBC 6.9, hemoglobin 9.4 (stable), platelet count 680k (stable).  Potassium 2.4.  UA not suggestive of infection.  Lactic acid normal.  Blood cultures drawn.  High-sensitivity troponin 34.  EKG showing sinus tachycardia, no acute ischemic changes.  Chest x-ray not suggestive of pneumonia.  CT chest with contrast showing no evidence of pneumonia or metastatic disease; did not comment on PE.  CT abdomen pelvis negative for acute infectious process and showing stable metastatic disease. Patient was given amlodipine, Ativan, metoprolol, potassium supplement, vancomycin, cefepime, and 1 L normal saline bolus. ? ?Patient states she had Port-A-Cath placed 5 days ago.  Since yesterday she is having lightheadedness and palpitations.  Last night she had orange juice and vomited afterwards.  She has not been able to tolerate any p.o. intake since then.  Denies abdominal pain or diarrhea.  Denies cough, shortness of breath, or chest pain.  Denies any urinary symptoms.  ? ?Review of Systems:  ?Review of Systems  ?All other systems reviewed and are negative. ? ?Past Medical History:  ?Diagnosis Date  ? Allergy   ? seasonal  ? Anemia   ? Blood infection 1985  ? Blood transfusion without reported diagnosis   ? Diverticulitis   ? Family history of breast cancer   ? Family history of colon cancer    ? Family history of stomach cancer   ? Hypertension   ? Obesity   ? Sleep apnea   ? ? ?Past Surgical History:  ?Procedure Laterality Date  ? COLECTOMY WITH COLOSTOMY CREATION/HARTMANN PROCEDURE N/A 04/15/2021  ? Procedure: COLOSTOMY CREATION/HARTMANN PROCEDURE;  Surgeon: [name], MD;  Location: [facility];  Service: General;  Laterality: N/A;  ? IR CATHETER TUBE CHANGE  12/12/2020  ? IR CATHETER TUBE CHANGE  01/27/2021  ? IR CATHETER TUBE CHANGE  02/19/2021  ? IR CATHETER TUBE CHANGE  04/13/2021  ? IR IMAGING GUIDED PORT INSERTION  10/02/2021  ? IR RADIOLOGIST EVAL & MGMT  11/27/2020  ? IR RADIOLOGIST EVAL & MGMT  12/11/2020  ? IR RADIOLOGIST EVAL & MGMT  01/07/2021  ? IR RADIOLOGIST EVAL & MGMT  02/04/2021  ? IR SINUS/FIST TUBE CHK-NON GI  12/12/2020  ? IR SINUS/FIST TUBE CHK-NON GI  02/19/2021  ? LAPAROSCOPIC PARTIAL COLECTOMY N/A 04/15/2021  ? Procedure: LAPAROSCOPIC ASSISTED HARTMANN RESECTION;  Surgeon: [name], MD;  Location: [facility];  Service: General;  Laterality: N/A;  ? ? ? reports that she has never smoked. She has never used smokeless tobacco. She reports that she does not currently use alcohol. She reports that she does not use drugs. ? ?Allergies  ?Allergen Reactions  ? Chlorhexidine Gluconate Hives and Itching  ?  CHG wipes cause itching and hives requiring benadryl  ? Shellfish Allergy Hives  ? Penicillins Hives and Rash  ?  Has patient had a PCN reaction causing immediate rash, facial/tongue/throat swelling, SOB or lightheadedness with hypotension: No ?Has  patient had a PCN reaction causing severe rash involving mucus membranes or skin necrosis: hives ?Has patient had a PCN reaction that required hospitalization No ?Has patient had a PCN reaction occurring within the last 10 years: yes ?If all of the above answers are "NO", then may proceed with Cephalosporin use. ?  ? ? ?Family History  ?Problem Relation Age of Onset  ? Cancer Mother   ?     breast, and in remission, dx 36s  ? Diabetes Mother    ? Hypertension Mother   ? Colon cancer Mother   ?     dx at age 48  ? Congestive Heart Failure Father   ? Hypertension Father   ? Stomach cancer Maternal Uncle   ? Heart disease Paternal Aunt   ? Cancer Paternal Uncle   ?     colon  ? Diabetes Maternal Grandmother   ? Hypertension Maternal Grandmother   ? Diabetes Maternal Grandfather   ? Kidney disease Maternal Grandfather   ? Diabetes Paternal Grandmother   ? Cancer Paternal Grandfather   ?     colon  ? Autism Son   ? Colon polyps Half-Sister   ?     colectomy  ? ? ?Prior to Admission medications   ?Medication Sig Start Date End Date Taking? Authorizing Provider  ?amLODipine (NORVASC) 10 MG tablet Take 10 mg by mouth every evening. 05/26/21  Yes [provider]  ?DULoxetine (CYMBALTA) 30 MG capsule Take 1 capsule (30 mg total) by mouth daily. 09/14/21  Yes [name], [name], NP  ?metoprolol tartrate (LOPRESSOR) 50 MG tablet Take 1 tablet (50 mg total) by mouth 2 (two) times daily. 07/14/21  Yes Mikayla Chard, MD  ?diclofenac Sodium (VOLTAREN) 1 % GEL Apply 4 g topically 4 (four) times daily as needed (for mild pain). ?Patient not taking: Reported on 10/06/2021 05/14/21   Cathlyn Parsons, PA-C  ?gabapentin (NEURONTIN) 300 MG capsule Take 2 capsules (600 mg total) by mouth 3 (three) times daily. ?Patient not taking: Reported on 10/06/2021 09/14/21   [name], [name], NP  ?heparin lock flush 100 UNIT/ML SOLN injection Use 1 syringe (into each lumen) twice daily ?Patient not taking: Reported on 10/06/2021 06/30/21   [name], MD  ?HYDROmorphone (DILAUDID) 2 MG tablet Take 1-2 tablets (2-4 mg total) by mouth every 4 (four) hours as needed for severe pain or moderate pain. ?Patient not taking: Reported on 10/06/2021 09/23/21   [name], [name], NP  ?lidocaine-prilocaine (EMLA) cream Apply a dime size amount of cream to [facility] prior 45 mins to 1 hr prior to Port-a-cath access as directed. 09/29/21   [name], MD  ?morphine (MS CONTIN)  30 MG 12 hr tablet Take 1 tablet (30 mg total) by mouth every 8 (eight) hours. ?Patient not taking: Reported on 10/06/2021 09/16/21   [name], [name], NP  ?potassium chloride SA (KLOR-CON M20) 20 MEQ tablet Take 1 tablet (20 mEq total) by mouth 2 (two) times daily. 10/06/21   [name], MD  ?Sodium Chloride Flush (NORMAL SALINE FLUSH) 0.9 % SOLN Inject 10 mLs (1 syringe) into lumen twice daily as directed ?Patient not taking: Reported on 10/06/2021 06/30/21   [name], MD  ?prochlorperazine (COMPAZINE) 10 MG tablet Take 1 tablet (10 mg total) by mouth every 6 (six) hours as needed (Nausea or vomiting). 05/25/21 08/31/21  [name], MD  ? ? ?Physical Exam: ?Vitals:  ? 10/06/21 2300 10/06/21 2330 10/06/21 2343 10/07/21 0000  ?BP: (!) 142/99 Marland Kitchen)  155/110 (!) 155/110 (!) 174/111  ?Pulse: 94 (!) 112 (!) 144 (!) 117  ?Resp: 20 (!) 22  (!) 30  ?Temp:      ?TempSrc:      ?SpO2: 100% 100%  100%  ?Weight:      ?Height:      ? ? ?Physical Exam ?Vitals reviewed.  ?Constitutional:   ?   General: She is not in acute distress. ?HENT:  ?   Head: Normocephalic and atraumatic.  ?Eyes:  ?   Extraocular Movements: Extraocular movements intact.  ?Cardiovascular:  ?   Rate and Rhythm: Regular rhythm. Tachycardia present.  ?   Pulses: Normal pulses.  ?Pulmonary:  ?   Effort: Pulmonary effort is normal. No respiratory distress.  ?   Breath sounds: Normal breath sounds.  ?Abdominal:  ?   General: Bowel sounds are normal. There is no distension.  ?   Palpations: Abdomen is soft.  ?   Tenderness: There is no abdominal tenderness.  ?Musculoskeletal:     ?   General: No swelling or tenderness.  ?   Cervical back: Normal range of motion. No rigidity.  ?Skin: ?   General: Skin is warm and dry.  ?Neurological:  ?   General: No focal deficit present.  ?   Mental Status: She is alert and oriented to person, place, and time.  ?  ? ?Labs on Admission: I have personally reviewed following labs and imaging studies ? ?CBC: ?Recent Labs  ?Lab  10/06/21 ?2223  ?WBC 6.9  ?NEUTROABS 4.8  ?HGB 9.4*  ?HCT 30.9*  ?MCV 84.7  ?PLT 680*  ? ?Basic Metabolic Panel: ?Recent Labs  ?Lab 10/06/21 ?2223  ?NA 140  ?K 2.4*  ?CL 100  ?CO2 28  ?GLUCOSE 98  ?BUN 9  ?CREA

## 2021-10-07 NOTE — Progress Notes (Signed)
Pharmacy Antibiotic Note ? ?Mikayla Rios is a 36 y.o. female admitted on 10/06/2021 with sepsis.  Pt with medical history significant of stage II colon cancer status post small bowel and left colon resection in November 2022 currently on immunotherapy, history of diverticulitis with perforation and abscess, hypertension, sleep apnea, anxiety, depression presented to ED with complaints of fevers, chills, palpitations, and malaisePharmacy has been consulted for vancomycin and cefepime dosing.  1st doses given in the ED ? ?Plan: ?Vancomycin '1250mg'$  IV q24h (AUC 459.9, used  Scr 0.8) ?Cefepime 2gm IV q8h ?Follow renal function, cultures and clinical course ? ?Height: '5\' 3"'$  (160 cm) ?Weight: 77.1 kg (170 lb) ?IBW/kg (Calculated) : 52.4 ? ?Temp (24hrs), Avg:98.4 ?F (36.9 ?C), Min:98.4 ?F (36.9 ?C), Max:98.4 ?F (36.9 ?C) ? ?Recent Labs  ?Lab 10/06/21 ?2223 10/07/21 ?0149 10/07/21 ?0150  ?WBC 6.9  --   --   ?CREATININE 0.52 0.40*  --   ?LATICACIDVEN 1.0  --  1.4  ?  ?Estimated Creatinine Clearance: 94.7 mL/min (A) (by C-G formula based on SCr of 0.4 mg/dL (L)).   ? ?Allergies  ?Allergen Reactions  ? Chlorhexidine Gluconate Hives and Itching  ?  CHG wipes cause itching and hives requiring benadryl  ? Shellfish Allergy Hives  ? Penicillins Hives and Rash  ?  Has patient had a PCN reaction causing immediate rash, facial/tongue/throat swelling, SOB or lightheadedness with hypotension: No ?Has patient had a PCN reaction causing severe rash involving mucus membranes or skin necrosis: hives ?Has patient had a PCN reaction that required hospitalization No ?Has patient had a PCN reaction occurring within the last 10 years: yes ?If all of the above answers are "NO", then may proceed with Cephalosporin use. ?  ? ? ?Antimicrobials this admission: ?5/3 vanc >> ?5/3 cefepime >> ? ?Dose adjustments this admission: ? ? ?Microbiology results: ?5/2 BCx:  ? ?Thank you for allowing pharmacy to be a part of this patient?s care. ? ?Dolly Rias  RPh ?10/07/2021, 4:05 AM ? ?

## 2021-10-07 NOTE — Progress Notes (Signed)
Mikayla Rios   DOB:29-Aug-1985   JY#:[id number]   YQM#:[id number] ? ?Oncology follow up  ? ?Subjective: Patient is well-known to me, under my care for her metastatic colon cancer.  She is currently on immunotherapy only, he received the second dose Keytruda last week.  She noticed chills, fatigue, decreased appetite and mild nausea yesterday, and came into the ED for evaluation.  In the ED patient was tachycardic and tachypneic, no fever, labs and CT scan were done, she was admitted for ruling out sepsis. She is on IV antibiotics.  She is overall feeling better today, she reports mild rectal bleeding since yesterday. ? ? ?Objective:  ?Vitals:  ? 10/07/21 1232 10/07/21 1425  ?BP: (!) 139/96 (!) 140/95  ?Pulse: (!) 107 97  ?Resp: 18 (!) 21  ?Temp: 98.4 ?F (36.9 ?C) 98.2 ?F (36.8 ?C)  ?SpO2: 100% 100%  ?  Body mass index is 30.11 kg/m?. ? ?Intake/Output Summary (Last 24 hours) at 10/07/2021 1539 ?Last data filed at 10/07/2021 1428 ?Gross per 24 hour  ?Intake 1080 ml  ?Output 1220 ml  ?Net -140 ml  ? ? ? Sclerae unicteric ? Oropharynx clear ? No peripheral adenopathy ? Lungs clear -- no rales or rhonchi ? Heart regular rate and rhythm ? Abdomen soft, colostomy on left side, palpable mass in the left upper quadrant of abdomen stable. ? MSK no focal spinal tenderness, no peripheral edema ? Neuro nonfocal ? ? ? ?CBG (last 3)  ?No results for input(s): GLUCAP in the last 72 hours. ? ? ?Labs:  ?Urine Studies ?No results for input(s): UHGB, CRYS in the last 72 hours. ? ?Invalid input(s): UACOL, UAPR, USPG, UPH, UTP, UGL, UKET, UBIL, UNIT, UROB, ULEU, UEPI, UWBC, URBC, UBAC, CAST, UCOM, BILUA ? ?Basic Metabolic Panel: ?Recent Labs  ?Lab 10/06/21 ?2223 10/07/21 ?0140 10/07/21 ?0149 10/07/21 ?5284  ?NA 140  --  140 140  ?K 2.4*  --  2.5* 2.6*  ?CL 100  --  99 104  ?CO2 28  --   --  27  ?GLUCOSE 98  --  99 90  ?BUN 9  --  7 8  ?CREATININE 0.52  --  0.40* 0.43*  ?CALCIUM 8.9  --   --  8.3*  ?MG  --  1.6*  --   --   ? ?GFR ?Estimated  Creatinine Clearance: 94.7 mL/min (A) (by C-G formula based on SCr of 0.43 mg/dL (L)). ?Liver Function Tests: ?Recent Labs  ?Lab 10/06/21 ?2223  ?AST 14*  ?ALT 10  ?ALKPHOS 66  ?BILITOT 0.7  ?PROT 7.1  ?ALBUMIN 2.5*  ? ?No results for input(s): LIPASE, AMYLASE in the last 168 hours. ?No results for input(s): AMMONIA in the last 168 hours. ?Coagulation profile ?Recent Labs  ?Lab 10/06/21 ?2223  ?INR 1.1  ? ? ?CBC: ?Recent Labs  ?Lab 10/06/21 ?2223 10/07/21 ?0149 10/07/21 ?1324 10/07/21 ?1009  ?WBC 6.9  --  7.2 6.6  ?NEUTROABS 4.8  --   --   --   ?HGB 9.4* 8.8* 8.0* 7.8*  ?HCT 30.9* 26.0* 27.3* 25.9*  ?MCV 84.7  --  86.7 86.0  ?PLT 680*  --  570* 519*  ? ?Cardiac Enzymes: ?No results for input(s): CKTOTAL, CKMB, CKMBINDEX, TROPONINI in the last 168 hours. ?BNP: ?Invalid input(s): POCBNP ?CBG: ?No results for input(s): GLUCAP in the last 168 hours. ?D-Dimer ?No results for input(s): DDIMER in the last 72 hours. ?Hgb A1c ?No results for input(s): HGBA1C in the last 72 hours. ?Lipid Profile ?No results  for input(s): CHOL, HDL, LDLCALC, TRIG, CHOLHDL, LDLDIRECT in the last 72 hours. ?Thyroid function studies ?No results for input(s): TSH, T4TOTAL, T3FREE, THYROIDAB in the last 72 hours. ? ?Invalid input(s): FREET3 ?Anemia work up ?No results for input(s): VITAMINB12, FOLATE, FERRITIN, TIBC, IRON, RETICCTPCT in the last 72 hours. ?Microbiology ?Recent Results (from the past 240 hour(s))  ?Blood Culture (routine x 2)     Status: None (Preliminary result)  ? Collection Time: 10/06/21 10:23 PM  ? Specimen: BLOOD  ?Result Value Ref Range Status  ? Specimen Description   Final  ?  BLOOD SITE NOT SPECIFIED ?Performed at [facility], [place] [address], [place], [place] [address] ?  ? Special Requests   Final  ?  BOTTLES DRAWN AEROBIC AND ANAEROBIC Blood Culture adequate volume ?Performed at [facility], [place] [address], [place], [place] [address] ?  ? Culture   Final  ?  NO GROWTH < 12 HOURS ?Performed at  [facility], [place] [address], [place], [place] [address] ?  ? Report Status PENDING  Incomplete  ?Blood Culture (routine x 2)     Status: None (Preliminary result)  ? Collection Time: 10/06/21 10:23 PM  ? Specimen: BLOOD  ?Result Value Ref Range Status  ? Specimen Description   Final  ?  BLOOD SITE NOT SPECIFIED ?Performed at [facility], [place] [address], [place], [place] [address] ?  ? Special Requests   Final  ?  BOTTLES DRAWN AEROBIC AND ANAEROBIC Blood Culture adequate volume ?Performed at [facility], [place] [address], [place], [place] [address] ?  ? Culture   Final  ?  NO GROWTH < 12 HOURS ?Performed at [facility], [place] [address] [place], [place] [address] ?  ? Report Status PENDING  Incomplete  ?Resp Panel by RT-PCR (Flu A&B, Covid) Nasopharyngeal Swab     Status: None  ? Collection Time: 10/07/21  7:31 AM  ? Specimen: Nasopharyngeal Swab; Nasopharyngeal(NP) swabs in vial transport medium  ?Result Value Ref Range Status  ? SARS Coronavirus 2 by RT PCR NEGATIVE NEGATIVE Final  ?  Comment: (NOTE) ?SARS-CoV-2 target nucleic acids are NOT DETECTED. ? ?The SARS-CoV-2 RNA is generally detectable in upper respiratory ?specimens during the acute phase of infection. The lowest ?concentration of SARS-CoV-2 viral copies this assay can detect is ?138 copies/mL. A negative result does not preclude SARS-Cov-2 ?infection and should not be used as the sole basis for treatment or ?other patient management decisions. A negative result may occur with  ?improper specimen collection/handling, submission of specimen other ?than nasopharyngeal swab, presence of viral mutation(s) within the ?areas targeted by this assay, and inadequate number of viral ?copies(<138 copies/mL). A negative result must be combined with ?clinical observations, patient history, and epidemiological ?information. The expected result is Negative. ? ?Fact Sheet for Patients:   ?EntrepreneurPulse.com.au ? ?Fact Sheet for Healthcare Providers:  ?IncredibleEmployment.be ? ?This test is no t yet approved or cleared by the [place] FDA and  ?has been authorized for detection and/or diagnosis of SARS-CoV-2 by ?FDA under an Emergency Use Authorization (EUA). This EUA will remain  ?in effect (meaning this test can be used) for the duration of the ?COVID-19 declaration under Section 564(b)(1) of the Act, 21 ?U.S.C.section 360bbb-3(b)(1), unless the authorization is terminated  ?or revoked sooner.  ? ? ?  ? Influenza A by PCR NEGATIVE NEGATIVE Final  ? Influenza B by PCR NEGATIVE NEGATIVE Final  ?  Comment: (NOTE) ?The Xpert Xpress SARS-CoV-2/FLU/RSV plus assay is intended as an aid ?in the diagnosis of influenza from Nasopharyngeal swab specimens and ?should not be used as a sole basis for treatment. Nasal washings and ?aspirates are unacceptable for Xpert Xpress SARS-CoV-2/FLU/RSV ?testing. ? ?Fact Sheet for Patients: ?EntrepreneurPulse.com.au ? ?Fact Sheet for Healthcare Providers: ?IncredibleEmployment.be ? ?This test is not yet approved or cleared by the [place] FDA and ?has been authorized for detection and/or diagnosis of SARS-CoV-2 by ?FDA under an Emergency Use Authorization (EUA). This EUA will remain ?in effect (meaning this test can be used) for the duration of the ?COVID-19 declaration under Section 564(b)(1) of the Act, 21 U.S.C. ?section 360bbb-3(b)(1), unless the authorization is terminated or ?revoked. ? ?Performed at [facility], [address] ?[place], [place] [address] ?  ? ? ? ? ?Studies:  ?CT CHEST ABDOMEN PELVIS W CONTRAST ? ?Result Date: 10/07/2021 ?CLINICAL DATA:  Possible sepsis, history of colon carcinoma EXAM: CT CHEST, ABDOMEN, AND PELVIS WITH CONTRAST TECHNIQUE: Multidetector CT imaging of the chest, abdomen and pelvis was performed following the standard protocol  during bolus administration of intravenous contrast. RADIATION DOSE REDUCTION: This exam was performed according to the departmental dose-optimization program which includes automated exposure control, adjustment of the mA and/or kV according to patient s

## 2021-10-07 NOTE — Progress Notes (Signed)
Dr. Louanne Belton made aware that the patient is having some very minimal rectal bleeding.  ?

## 2021-10-08 DIAGNOSIS — R6510 Systemic inflammatory response syndrome (SIRS) of non-infectious origin without acute organ dysfunction: Secondary | ICD-10-CM | POA: Diagnosis not present

## 2021-10-08 DIAGNOSIS — R778 Other specified abnormalities of plasma proteins: Secondary | ICD-10-CM | POA: Diagnosis not present

## 2021-10-08 DIAGNOSIS — I10 Essential (primary) hypertension: Secondary | ICD-10-CM | POA: Diagnosis not present

## 2021-10-08 DIAGNOSIS — E876 Hypokalemia: Secondary | ICD-10-CM | POA: Diagnosis not present

## 2021-10-08 DIAGNOSIS — R509 Fever, unspecified: Secondary | ICD-10-CM

## 2021-10-08 LAB — CBC
HCT: 25.8 % — ABNORMAL LOW (ref 36.0–46.0)
HCT: 25.5 % — ABNORMAL LOW (ref 36.0–46.0)
Hemoglobin: 7.5 g/dL — ABNORMAL LOW (ref 12.0–15.0)
Hemoglobin: 7.6 g/dL — ABNORMAL LOW (ref 12.0–15.0)
MCH: 25.3 pg — ABNORMAL LOW (ref 26.0–34.0)
MCH: 25.8 pg — ABNORMAL LOW (ref 26.0–34.0)
MCHC: 29.1 g/dL — ABNORMAL LOW (ref 30.0–36.0)
MCHC: 29.8 g/dL — ABNORMAL LOW (ref 30.0–36.0)
MCV: 86.9 fL (ref 80.0–100.0)
MCV: 86.4 fL (ref 80.0–100.0)
Platelets: 460 10*3/uL — ABNORMAL HIGH (ref 150–400)
Platelets: 464 10*3/uL — ABNORMAL HIGH (ref 150–400)
RBC: 2.97 MIL/uL — ABNORMAL LOW (ref 3.87–5.11)
RBC: 2.95 MIL/uL — ABNORMAL LOW (ref 3.87–5.11)
RDW: 17 % — ABNORMAL HIGH (ref 11.5–15.5)
RDW: 16.8 % — ABNORMAL HIGH (ref 11.5–15.5)
WBC: 4.7 10*3/uL (ref 4.0–10.5)
WBC: 4.9 10*3/uL (ref 4.0–10.5)
nRBC: 0 % (ref 0.0–0.2)
nRBC: 0 % (ref 0.0–0.2)

## 2021-10-08 LAB — COMPREHENSIVE METABOLIC PANEL
ALT: 10 U/L (ref 0–44)
AST: 13 U/L — ABNORMAL LOW (ref 15–41)
Albumin: 2.3 g/dL — ABNORMAL LOW (ref 3.5–5.0)
Alkaline Phosphatase: 52 U/L (ref 38–126)
Anion gap: 7 (ref 5–15)
BUN: <5 mg/dL — ABNORMAL LOW (ref 6–20)
CO2: 26 mmol/L (ref 22–32)
Calcium: 8.5 mg/dL — ABNORMAL LOW (ref 8.9–10.3)
Chloride: 106 mmol/L (ref 98–111)
Creatinine, Ser: 0.41 mg/dL — ABNORMAL LOW (ref 0.44–1.00)
GFR, Estimated: >60 mL/min (ref >60)
Glucose, Bld: 93 mg/dL (ref 70–99)
Potassium: 3.4 mmol/L — ABNORMAL LOW (ref 3.5–5.1)
Sodium: 139 mmol/L (ref 135–145)
Total Bilirubin: 0.4 mg/dL (ref 0.3–1.2)
Total Protein: 6.1 g/dL — ABNORMAL LOW (ref 6.5–8.1)

## 2021-10-08 LAB — MAGNESIUM: Magnesium: 2 mg/dL (ref 1.7–2.4)

## 2021-10-08 MED ORDER — POTASSIUM CHLORIDE CRYS ER 20 MEQ PO TBCR
20.0000 meq | EXTENDED_RELEASE_TABLET | Freq: Two times a day (BID) | ORAL | Status: DC
  Administered 2021-10-08 – 2021-10-11 (×6): 20 meq via ORAL
  Filled 2021-10-08 (×6): qty 1

## 2021-10-08 MED ORDER — HYDROCORTISONE ACETATE 25 MG RE SUPP
25.0000 mg | Freq: Two times a day (BID) | RECTAL | Status: DC
  Administered 2021-10-08: 25 mg via RECTAL
  Filled 2021-10-08 (×7): qty 1

## 2021-10-08 MED ORDER — POTASSIUM CHLORIDE CRYS ER 20 MEQ PO TBCR
40.0000 meq | EXTENDED_RELEASE_TABLET | Freq: Once | ORAL | Status: AC
  Administered 2021-10-08: 40 meq via ORAL
  Filled 2021-10-08: qty 2

## 2021-10-08 MED ORDER — KETOROLAC TROMETHAMINE 15 MG/ML IJ SOLN
15.0000 mg | Freq: Three times a day (TID) | INTRAMUSCULAR | Status: AC | PRN
  Administered 2021-10-08 – 2021-10-09 (×3): 15 mg via INTRAVENOUS
  Filled 2021-10-08 (×3): qty 1

## 2021-10-08 MED ORDER — MELATONIN 3 MG PO TABS
3.0000 mg | ORAL_TABLET | Freq: Once | ORAL | Status: AC
  Administered 2021-10-08: 3 mg via ORAL
  Filled 2021-10-08: qty 1

## 2021-10-08 NOTE — Progress Notes (Signed)
?PROGRESS NOTE ? ? ? ?DRIANNA Rios  UKG:[id number] DOB: [birth year] DOA: 10/06/2021 ?PCP: [name], MD  ? ? ?Brief Narrative:  ?Mikayla Rios is a 36 y.o. female with past medical history of stage II lung cancer status post bowel and left colon resection in November 2022 currently on immunotherapy, status post colostomy, history of diabetes colitis with perforation and abscess, hypertension, sleep apnea, anxiety, depression presented hospital with fever chills palpitations and malaise.  In the ED patient was noted to be tachycardic and tachypneic with labs showing WBC at 6.9.  Potassium was low at 2.4.  Urinalysis was not suggestive of infection.  Lactic acid was normal.  Blood cultures were drawn.  Chest x-ray was negative for infection.  CT chest showing no evidence of pneumonia or pulmonary embolism.  Abdomen pelvis CT was negative for acute infectious process but stable metastatic disease.  Patient did have Port-A-Cath placed almost 5 days prior to presentation.  Patient was then admitted to hospital for possible sepsis and bacteremia.  ? ?Assessment and Plan. ? Principal Problem: ?  SIRS (systemic inflammatory response syndrome) (HCC) ?Active Problems: ?  Essential hypertension ?  Hypokalemia ?  Elevated troponin ?  H/O colon cancer, stage II ?  Fever ?  ?SIRS ?No obvious source of infection at this time.  Chest x-ray CT scan of the chest without any pneumonia.  Urinalysis was negative for infection.  On vancomycin Flagyl and cefepime for broad-spectrum coverage.  Received 2 L of IV fluid bolus in the ED and IV fluids during hospitalization.  We will discontinue IV fluids.  COVID and influenza was negative.  Procalcitonin was less than 0.10.  Temperature max of 98.8 ?F.  Patient is still tachycardic per oncology and this could be chronic tachycardia.   De-escalate antibiotic by tomorrow a.m. if no recurrence of fever/continued negative cultures.  Patient has started to feel little better today. ?  ?Nausea  and vomiting ?Skin abdomen and pelvis without any acute findings.  Has tolerated oral diet. ? ?Sinus tachycardia ?CT chest with contrast without any PE.  Likely has chronic tachycardia.  Patient is not hypoxic.  Received IV hydration initially but continue metoprolol from home. ?  ?Severe hypokalemia ? secondary to poor oral intake and vomiting.  Aggressively replenished.  Potassium of 3.4 today.  We will replace orally.  Check levels in AM. ? ?Hypomagnesemia. ?Received IV magnesium sulfate and orally.  Magnesium of 2.0 today. ?  ?Mild troponin elevation ?Likely due to demand ischemia from sepsis.  High-sensitivity troponin 34 > 50.  No chest pain or EKG changes.   ?  ?Stage II colon cancer ?Status post small bowel and left colon resection in November 2022, currently on immunotherapy.  CT chest without metastatic disease.  CT abdomen pelvis showing stable metastatic disease.  Oncology Dr. Burr Medico has seen the patient during hospitalization.  Plan for follow-up 5/16. ?  ?Hypertension ?Amlodipine and metoprolol at home.  Will resume amlodipine from home.  Continue metoprolol from home. ?  ?Anxiety and depression ?-Continue Cymbalta ?  ?Mild rectal bleed.  Has some smear while wiping.  History of colon cancer.  Painless as per the patient.  We will add hydrocortisone suppository for possible hemorrhoids.  Has a colostomy without any bleed.  Check hemoglobin in a.m.  ? ? ? DVT prophylaxis: Place and maintain sequential compression device Start: 10/07/21 0542 ? ? ?Code Status:   ?  Code Status: Full Code ? ?Disposition: Home likely on 10/09/2021. ? ?Status is:  Inpatient ?Remains inpatient appropriate because: IV antibiotics, monitoring fever and cultures, ? ? Family Communication: Spoke with the patient at bedside. ? ?Consultants:  ?Dr. Burr Medico oncology ? ?Procedures:  ?None ? ?Antimicrobials:  ?Vancomycin cefepime and metronidazole 5/2> ? ?Anti-infectives (From admission, onward)  ? ? Start     Dose/Rate Route Frequency Ordered  Stop  ? 10/08/21 0000  vancomycin (VANCOREADY) IVPB 1250 mg/250 mL       ? 1,250 mg ?166.7 mL/hr over 90 Minutes Intravenous Every 24 hours 10/07/21 0407    ? 10/07/21 0800  ceFEPIme (MAXIPIME) 2 g in sodium chloride 0.9 % 100 mL IVPB       ? 2 g ?200 mL/hr over 30 Minutes Intravenous Every 8 hours 10/07/21 0407    ? 10/07/21 0400  metroNIDAZOLE (FLAGYL) IVPB 500 mg       ? 500 mg ?100 mL/hr over 60 Minutes Intravenous Every 12 hours 10/07/21 0357    ? 10/06/21 2330  vancomycin (VANCOREADY) IVPB 1500 mg/300 mL       ? 1,500 mg ?150 mL/hr over 120 Minutes Intravenous  Once 10/06/21 2317 10/07/21 0329  ? 10/06/21 2330  ceFEPIme (MAXIPIME) 2 g in sodium chloride 0.9 % 100 mL IVPB       ? 2 g ?200 mL/hr over 30 Minutes Intravenous  Once 10/06/21 2318 10/07/21 0136  ? ?  ? ? ? ?Subjective: ?Today, patient was seen and examined at bedside.  States that she feels a little better today with overall energy.  Denies any nausea vomiting or abdominal pain.  Denies fever chills or rigor.  Had mild rectal bleed on wiping. ? ?Objective: ?Vitals:  ? 10/07/21 1425 10/07/21 1652 10/07/21 2022 10/08/21 0814  ?BP: (!) 140/95 (!) 141/93 (!) 149/103 (!) 146/102  ?Pulse: 97 (!) 107 99 (!) 107  ?Resp: (!) '21 20  18  '$ ?Temp: 98.2 ?F (36.8 ?C) 98.6 ?F (37 ?C) 98.8 ?F (37.1 ?C) (!) 97.5 ?F (36.4 ?C)  ?TempSrc: Oral Oral Oral Oral  ?SpO2: 100% 100%  100%  ?Weight:      ?Height:      ? ? ?Intake/Output Summary (Last 24 hours) at 10/08/2021 0914 ?Last data filed at 10/08/2021 0429 ?Gross per 24 hour  ?Intake 2801.2 ml  ?Output 2025 ml  ?Net 776.2 ml  ? ?Filed Weights  ? 10/06/21 1654  ?Weight: 77.1 kg  ?Body mass index is 30.11 kg/m?.  ? ?Physical Examination: ?General: Obese built, not in obvious distress ?HENT:   No scleral pallor or icterus noted. Oral mucosa is moist.  ?Chest:  Clear breath sounds.  Diminished breath sounds bilaterally. No crackles or wheezes.  ?CVS: S1 &S2 heard. No murmur.  Regular rate and rhythm. ?Abdomen: Soft, nontender,  nondistended.  Bowel sounds are heard.  Colostomy bag in place.  Palpable mass in the left upper quadrant. ?Extremities: No cyanosis, clubbing or edema.  Peripheral pulses are palpable. ?Psych: Alert, awake and oriented, normal mood ?CNS:  No cranial nerve deficits.  Power equal in all extremities.   ?Skin: Warm and dry.  No rashes noted. ? ?Data Reviewed:  ? ?CBC: ?Recent Labs  ?Lab 10/06/21 ?2223 10/07/21 ?0149 10/07/21 ?6213 10/07/21 ?1009 10/08/21 ?0507  ?WBC 6.9  --  7.2 6.6 4.7  ?NEUTROABS 4.8  --   --   --   --   ?HGB 9.4* 8.8* 8.0* 7.8* 7.5*  ?HCT 30.9* 26.0* 27.3* 25.9* 25.8*  ?MCV 84.7  --  86.7 86.0 86.9  ?PLT 680*  --  570* 519* 460*  ? ? ?Basic Metabolic Panel: ?Recent Labs  ?Lab 10/06/21 ?2223 10/07/21 ?0140 10/07/21 ?0149 10/07/21 ?9983 10/08/21 ?0507  ?NA 140  --  140 140 139  ?K 2.4*  --  2.5* 2.6* 3.4*  ?CL 100  --  99 104 106  ?CO2 28  --   --  27 26  ?GLUCOSE 98  --  99 90 93  ?BUN 9  --  7 8 <5*  ?CREATININE 0.52  --  0.40* 0.43* 0.41*  ?CALCIUM 8.9  --   --  8.3* 8.5*  ?MG  --  1.6*  --   --  2.0  ? ? ?Liver Function Tests: ?Recent Labs  ?Lab 10/06/21 ?2223 10/08/21 ?0507  ?AST 14* 13*  ?ALT 10 10  ?ALKPHOS 66 52  ?BILITOT 0.7 0.4  ?PROT 7.1 6.1*  ?ALBUMIN 2.5* 2.3*  ? ? ? ?Radiology Studies: ?CT CHEST ABDOMEN PELVIS W CONTRAST ? ?Result Date: 10/07/2021 ?CLINICAL DATA:  Possible sepsis, history of colon carcinoma EXAM: CT CHEST, ABDOMEN, AND PELVIS WITH CONTRAST TECHNIQUE: Multidetector CT imaging of the chest, abdomen and pelvis was performed following the standard protocol during bolus administration of intravenous contrast. RADIATION DOSE REDUCTION: This exam was performed according to the departmental dose-optimization program which includes automated exposure control, adjustment of the mA and/or kV according to patient size and/or use of iterative reconstruction technique. CONTRAST:  191m OMNIPAQUE IOHEXOL 300 MG/ML  SOLN COMPARISON:  Chest x-ray from the previous day, CT from 08/28/2021  FINDINGS: CT CHEST FINDINGS Cardiovascular: Thoracic aorta shows no aneurysmal dilatation. Mild coronary calcifications are seen. The heart is at the upper limits of normal in size. Right chest wall port

## 2021-10-08 NOTE — Hospital Course (Addendum)
10-07-2021: admitted for [facility] obvious source of infection during hospitalization.  Chest x-ray CT scan of the chest without any pneumonia. COVID and influenza was negative.  Procalcitonin was less than 0.10.  Urinalysis was negative for infection.  On vancomycin Flagyl and cefepime for broad-spectrum coverage.  Received 2 L of IV fluid bolus in the ED and IV fluids during hospitalization.  Cultures remained negative. CT sinuses did not show an obvious source.  ? ?10-08-2021: remains afebrile. ?10-09-21: Stopped IV antibiotics and continued to be afebrile throughout the night and through 10-10-21 ?Discharged without antibiotics given lack of source or symptoms.  ?

## 2021-10-09 DIAGNOSIS — R778 Other specified abnormalities of plasma proteins: Secondary | ICD-10-CM | POA: Diagnosis not present

## 2021-10-09 DIAGNOSIS — R630 Anorexia: Secondary | ICD-10-CM

## 2021-10-09 DIAGNOSIS — R000 Tachycardia, unspecified: Secondary | ICD-10-CM | POA: Diagnosis not present

## 2021-10-09 DIAGNOSIS — D696 Thrombocytopenia, unspecified: Secondary | ICD-10-CM

## 2021-10-09 DIAGNOSIS — K625 Hemorrhage of anus and rectum: Secondary | ICD-10-CM

## 2021-10-09 DIAGNOSIS — R6510 Systemic inflammatory response syndrome (SIRS) of non-infectious origin without acute organ dysfunction: Secondary | ICD-10-CM | POA: Diagnosis not present

## 2021-10-09 DIAGNOSIS — D649 Anemia, unspecified: Secondary | ICD-10-CM | POA: Diagnosis not present

## 2021-10-09 DIAGNOSIS — C188 Malignant neoplasm of overlapping sites of colon: Secondary | ICD-10-CM

## 2021-10-09 DIAGNOSIS — R110 Nausea: Secondary | ICD-10-CM

## 2021-10-09 LAB — BASIC METABOLIC PANEL
Anion gap: 9 (ref 5–15)
BUN: 5 mg/dL — ABNORMAL LOW (ref 6–20)
CO2: 24 mmol/L (ref 22–32)
Calcium: 8.5 mg/dL — ABNORMAL LOW (ref 8.9–10.3)
Chloride: 106 mmol/L (ref 98–111)
Creatinine, Ser: 0.36 mg/dL — ABNORMAL LOW (ref 0.44–1.00)
GFR, Estimated: >60 mL/min (ref >60)
Glucose, Bld: 90 mg/dL (ref 70–99)
Potassium: 3.8 mmol/L (ref 3.5–5.1)
Sodium: 139 mmol/L (ref 135–145)

## 2021-10-09 LAB — PREPARE RBC (CROSSMATCH)

## 2021-10-09 LAB — MAGNESIUM: Magnesium: 2 mg/dL (ref 1.7–2.4)

## 2021-10-09 MED ORDER — SODIUM CHLORIDE 0.9% IV SOLUTION: Freq: Once | INTRAVENOUS | Status: AC

## 2021-10-09 MED ORDER — METOPROLOL TARTRATE 5 MG/5ML IV SOLN
5.0000 mg | INTRAVENOUS | Status: AC
  Administered 2021-10-09: 5 mg via INTRAVENOUS
  Filled 2021-10-09: qty 5

## 2021-10-09 MED ORDER — TRAZODONE HCL 50 MG PO TABS
25.0000 mg | ORAL_TABLET | Freq: Once | ORAL | Status: AC
  Administered 2021-10-09: 25 mg via ORAL
  Filled 2021-10-09: qty 1

## 2021-10-09 MED ORDER — METOPROLOL TARTRATE 50 MG PO TABS
50.0000 mg | ORAL_TABLET | Freq: Two times a day (BID) | ORAL | Status: DC
  Administered 2021-10-09 – 2021-10-10 (×2): 50 mg via ORAL
  Filled 2021-10-09 (×2): qty 1

## 2021-10-09 MED ORDER — LABETALOL HCL 5 MG/ML IV SOLN: 5.0000 mg | INTRAVENOUS | Status: DC | PRN

## 2021-10-09 NOTE — Assessment & Plan Note (Addendum)
No obvious source of infection during hospitalization.  Chest x-ray CT scan of the chest without any pneumonia. COVID and influenza was negative.  Procalcitonin was less than 0.10.  Urinalysis was negative for infection.  On vancomycin Flagyl and cefepime for broad-spectrum coverage.  Received 2 L of IV fluid bolus in the ED and IV fluids during hospitalization.  Cultures remained negative. CT sinuses did not show an obvious source. Stopped IV antibiotics and continued to be afebrile throughout the night. Discharged without antibiotics given lack of source or symptoms.  ?

## 2021-10-09 NOTE — Progress Notes (Signed)
?PROGRESS NOTE ? ? ? ?MICHALINE KINDIG  NUU:[id number] DOB: [birth year] DOA: 10/06/2021 ?PCP: [name], MD ? ?Brief Narrative:  ?HPI: Mikayla Rios is a 36 y.o. 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  In the ED, patient tachycardic and tachypneic.  Afebrile and not hypotensive or hypoxic.  Labs showing WBC 6.9, hemoglobin 9.4 (stable), platelet count 680k (stable).  Potassium 2.4.  UA not suggestive of infection.  Lactic acid normal.  Blood cultures drawn.  High-sensitivity troponin 34.  EKG showing sinus tachycardia, no acute ischemic changes.  Chest x-ray not suggestive of pneumonia.  CT chest with contrast showing no evidence of pneumonia or metastatic disease; did not comment on PE.  CT abdomen pelvis negative for acute infectious process and showing stable metastatic disease. Patient was given amlodipine, Ativan, metoprolol, potassium supplement, vancomycin, cefepime, and 1 L normal saline bolus. ?  ?Patient states she had Port-A-Cath placed 5 days ago.  Since yesterday she is having lightheadedness and palpitations.  Last night she had orange juice and vomited afterwards.  She has not been able to tolerate any p.o. intake since then.  Denies abdominal pain or diarrhea.  Denies cough, shortness of breath, or chest pain.  Denies any urinary symptoms.  ? ?Hospital Course: ?10-07-2021: admitted for SIRS ?10-08-2021: remains afebrile. ? ?Principal Problem: ?  SIRS (systemic inflammatory response syndrome) (HCC) ?Active Problems: ?  Sinus tachycardia ?  Anemia ?  Essential hypertension ?  Hypokalemia ?  Elevated troponin ?  H/O colon cancer, stage II ?  Fever ? ?  ?Assessment and Plan: ?* SIRS (systemic inflammatory response  syndrome) (HCC) ?No obvious source of infection at this time.  Chest x-ray CT scan of the chest without any pneumonia.  Urinalysis was negative for infection.  On vancomycin Flagyl and cefepime for broad-spectrum coverage.  Received 2 L of IV fluid bolus in the ED and IV fluids during hospitalization.  We will discontinue IV fluids.  COVID and influenza was negative.  Procalcitonin was less than 0.10.  Temperature max of 98.8 ?F.  Patient is still tachycardic per oncology and this could be chronic tachycardia. ? ?Cultures remained negative. Will stop IV ABX and monitor overnight for fevers. ? ?Sinus tachycardia ?Multifactorial. Could be due to anemia, her SIRS, chronic tachycardia, timing of lopressor dose. Will give 1 unit PRBC and change lopressor dosing to 8 AM and 8 pm. Will give 1 dose IV lopressor per pt request due to her tachycardia. I also this her anxiety is driving her tachycardia as well. ? ?Fever ?Resolved. Will stop abx today(10-09-2021) ? ?H/O colon cancer, stage II ?Stable. Followed by oncology ? ?Elevated troponin ?Likely due to demand ischemia from sepsis.  High-sensitivity troponin 34 > 50.  No chest pain or EKG changes.   ? ? ?Hypokalemia ?secondary to poor oral intake and vomiting.  Aggressively replenished. Potassium levels normal today at 3.8 ? ? ?Essential hypertension ?Stable. Continue with lopressor and norvasc. Change timing of lopressor to 8 am as pt states she gets up earlier in the AM. ? ?Anemia ?Worsened over last 2 days. Discussed oncology. Cannot rule out anemia as partial cause of her tachycardia. Will give 1 unit PRBC. Pt agreeable to PRBC and gives verbal informed consent. Pt has received PRBC before. ? ? ?  DVT prophylaxis: SCDs ?  Code Status: Full Code ?Family Communication: no family at bedside ?Disposition Plan: possible DC to home on 10-10-2021 if afebrile ? ?Consultants:  ?oncology ? ?Procedures:  ?none ? ?Antimicrobials:  ?IV cefepime/vanco 10-07-2021 to 10-09-2021. Stopped  10-09-2021 ? ? ?Subjective: ?Remains afebrile. Tachycardic this AM. Pt states she just used the bedside commode and didn't have any help. Feels anxious. Wanting dose of IV lopressor for her HR. States she normally takes her lopressor earlier than 10 AM at home. Lives at home with husband and kids. 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 ? ?Objective: ?Vitals:  ? 10/09/21 0050 10/09/21 0215 10/09/21 0540 10/09/21 1059  ?BP: (!) 152/106 (!) 146/99 (!) 142/101 (!) 132/94  ?Pulse:   96 93  ?Resp: (!) 22  20   ?Temp:   98.5 ?F (36.9 ?C)   ?TempSrc:   Oral   ?SpO2:   99%   ?Weight:      ?Height:      ? ? ?Intake/Output Summary (Last 24 hours) at 10/09/2021 1116 ?Last data filed at 10/09/2021 0900 ?Gross per 24 hour  ?Intake 2130 ml  ?Output 150 ml  ?Net 1980 ml  ? ?Filed Weights  ? 10/06/21 1654  ?Weight: 77.1 kg  ? ? ?Examination: ? ?Physical Exam ?Vitals and nursing note reviewed.  ?Constitutional:   ?   General: She is not in acute distress. ?   Appearance: She is not toxic-appearing or diaphoretic.  ?   Comments: Appears chronically ill  ?HENT:  ?   Head: Normocephalic and atraumatic.  ?   Nose: Nose normal. No rhinorrhea.  ?Eyes:  ?   General: No scleral icterus. ?Cardiovascular:  ?   Rate and Rhythm: Regular rhythm. Tachycardia present.  ?Pulmonary:  ?   Effort: Pulmonary effort is normal. No respiratory distress.  ?   Breath sounds: Normal breath sounds. No wheezing or rales.  ?Abdominal:  ?   General: Bowel sounds are normal. There is no distension.  ?   Tenderness: There is no abdominal tenderness. There is no guarding or rebound.  ?Musculoskeletal:  ?   Right lower leg: No edema.  ?   Left lower leg: No edema.  ?Skin: ?   General: Skin is warm and dry.  ?   Capillary Refill: Capillary refill takes less than 2 seconds.  ?Neurological:  ?   General: No focal deficit present.  ?   Mental Status: She is alert and oriented to person, place, and time.  ? ? ?Data Reviewed: I have personally reviewed following labs and imaging  studies ? ?CBC: ?Recent Labs  ?Lab 10/06/21 ?2223 10/07/21 ?0149 10/07/21 ?5329 10/07/21 ?1009 10/08/21 ?0507 10/08/21 ?1243  ?WBC 6.9  --  7.2 6.6 4.7 4.9  ?NEUTROABS 4.8  --   --   --   --   --   ?HGB 9.4* 8.8* 8.0* 7.8* 7.5* 7.6*  ?HCT 30.9* 26.0* 27.3* 25.9* 25.8* 25.5*  ?MCV 84.7  --  86.7 86.0 86.9 86.4  ?PLT 680*  --  570* 519* 460* 464*  ? ?Basic Metabolic Panel: ?Recent Labs  ?Lab 10/06/21 ?2223 10/07/21 ?0140 10/07/21 ?0149 10/07/21 ?9242 10/08/21 ?0507 10/09/21 ?0345  ?NA 140  --  140 140 139 139  ?K 2.4*  --  2.5* 2.6* 3.4* 3.8  ?CL 100  --  99 104 106 106  ?CO2 28  --   --  '27 26 24  '$ ?GLUCOSE 98  --  99 90 93 90  ?BUN 9  --  7 8 <5* 5*  ?CREATININE 0.52  --  0.40* 0.43* 0.41* 0.36*  ?CALCIUM 8.9  --   --  8.3* 8.5* 8.5*  ?MG  --  1.6*  --   --  2.0 2.0  ? ?GFR: ?Estimated Creatinine Clearance: 94.7 mL/min (A) (by C-G formula based on SCr of 0.36 mg/dL (L)). ?Liver Function Tests: ?Recent Labs  ?Lab 10/06/21 ?2223 10/08/21 ?0507  ?AST 14* 13*  ?ALT 10 10  ?ALKPHOS 66 52  ?BILITOT 0.7 0.4  ?PROT 7.1 6.1*  ?ALBUMIN 2.5* 2.3*  ? ?No results for input(s): LIPASE, AMYLASE in the last 168 hours. ?No results for input(s): AMMONIA in the last 168 hours. ?Coagulation Profile: ?Recent Labs  ?Lab 10/06/21 ?2223  ?INR 1.1  ? ?Cardiac Enzymes: ?No results for input(s): CKTOTAL, CKMB, CKMBINDEX, TROPONINI in the last 168 hours. ?BNP (last 3 results) ?No results for input(s): PROBNP in the last 8760 hours. ?HbA1C: ?No results for input(s): HGBA1C in the last 72 hours. ?CBG: ?No results for input(s): GLUCAP in the last 168 hours. ?Lipid Profile: ?No results for input(s): CHOL, HDL, LDLCALC, TRIG, CHOLHDL, LDLDIRECT in the last 72 hours. ?Thyroid Function Tests: ?No results for input(s): TSH, T4TOTAL, FREET4, T3FREE, THYROIDAB in the last 72 hours. ?Anemia Panel: ?No results for input(s): VITAMINB12, FOLATE, FERRITIN, TIBC, IRON, RETICCTPCT in the last 72 hours. ?Sepsis Labs: ?Recent Labs  ?Lab 10/06/21 ?2223  10/07/21 ?0150 10/07/21 ?6659  ?PROCALCITON  --   --  <0.10  ?LATICACIDVEN 1.0 1.4  --   ? ? ?Recent Results (from the past 240 hour(s))  ?Blood Culture (routine x 2)     Status: None (Preliminary result)  ? Collection Time:

## 2021-10-09 NOTE — Progress Notes (Signed)
Mikayla Rios   DOB:[birth year]   TJ#:[id number]   QTM#:[id number] ? ?Oncology follow up  ? ?Subjective: Mikayla Rios is overall doing better, N/V resolved, but still has intermittent tachycardia with palpitation, pressure after exertion.  No chest pain or other new complaints. ? ? ?Objective:  ?Vitals:  ? 10/09/21 1523 10/09/21 1555  ?BP: 130/85 (!) 137/99  ?Pulse: (!) 102 94  ?Resp: 20 18  ?Temp: 98.5 ?F (36.9 ?C) 98.5 ?F (36.9 ?C)  ?SpO2: 100% 100%  ?  Body mass index is 30.11 kg/m?. ? ?Intake/Output Summary (Last 24 hours) at 10/09/2021 1738 ?Last data filed at 10/09/2021 1300 ?Gross per 24 hour  ?Intake 2030 ml  ?Output 150 ml  ?Net 1880 ml  ? ? ? Sclerae unicteric ? Oropharynx clear ? No peripheral adenopathy ? Lungs clear -- no rales or rhonchi ? Heart regular rate and rhythm ? Abdomen soft, colostomy on left side, palpable mass in the left upper quadrant of abdomen stable. ? MSK no focal spinal tenderness, no peripheral edema ? Neuro nonfocal ? ? ? ?CBG (last 3)  ?No results for input(s): GLUCAP in the last 72 hours. ? ? ?Labs:  ?Urine Studies ?No results for input(s): UHGB, CRYS in the last 72 hours. ? ?Invalid input(s): UACOL, UAPR, USPG, UPH, UTP, UGL, UKET, UBIL, UNIT, UROB, ULEU, UEPI, UWBC, URBC, UBAC, CAST, UCOM, BILUA ? ?Basic Metabolic Panel: ?Recent Labs  ?Lab 10/06/21 ?2223 10/07/21 ?0140 10/07/21 ?0149 10/07/21 ?6256 10/08/21 ?0507 10/09/21 ?0345  ?NA 140  --  140 140 139 139  ?K 2.4*  --  2.5* 2.6* 3.4* 3.8  ?CL 100  --  99 104 106 106  ?CO2 28  --   --  '27 26 24  '$ ?GLUCOSE 98  --  99 90 93 90  ?BUN 9  --  7 8 <5* 5*  ?CREATININE 0.52  --  0.40* 0.43* 0.41* 0.36*  ?CALCIUM 8.9  --   --  8.3* 8.5* 8.5*  ?MG  --  1.6*  --   --  2.0 2.0  ? ?GFR ?Estimated Creatinine Clearance: 94.7 mL/min (A) (by C-G formula based on SCr of 0.36 mg/dL (L)). ?Liver Function Tests: ?Recent Labs  ?Lab 10/06/21 ?2223 10/08/21 ?0507  ?AST 14* 13*  ?ALT 10 10  ?ALKPHOS 66 52  ?BILITOT 0.7 0.4  ?PROT 7.1 6.1*  ?ALBUMIN 2.5* 2.3*  ? ?No  results for input(s): LIPASE, AMYLASE in the last 168 hours. ?No results for input(s): AMMONIA in the last 168 hours. ?Coagulation profile ?Recent Labs  ?Lab 10/06/21 ?2223  ?INR 1.1  ? ? ?CBC: ?Recent Labs  ?Lab 10/06/21 ?2223 10/07/21 ?0149 10/07/21 ?3893 10/07/21 ?1009 10/08/21 ?0507 10/08/21 ?1243  ?WBC 6.9  --  7.2 6.6 4.7 4.9  ?NEUTROABS 4.8  --   --   --   --   --   ?HGB 9.4* 8.8* 8.0* 7.8* 7.5* 7.6*  ?HCT 30.9* 26.0* 27.3* 25.9* 25.8* 25.5*  ?MCV 84.7  --  86.7 86.0 86.9 86.4  ?PLT 680*  --  570* 519* 460* 464*  ? ?Cardiac Enzymes: ?No results for input(s): CKTOTAL, CKMB, CKMBINDEX, TROPONINI in the last 168 hours. ?BNP: ?Invalid input(s): POCBNP ?CBG: ?No results for input(s): GLUCAP in the last 168 hours. ?D-Dimer ?No results for input(s): DDIMER in the last 72 hours. ?Hgb A1c ?No results for input(s): HGBA1C in the last 72 hours. ?Lipid Profile ?No results for input(s): CHOL, HDL, LDLCALC, TRIG, CHOLHDL, LDLDIRECT in the last 72 hours. ?Thyroid function studies ?  No results for input(s): TSH, T4TOTAL, T3FREE, THYROIDAB in the last 72 hours. ? ?Invalid input(s): FREET3 ?Anemia work up ?No results for input(s): VITAMINB12, FOLATE, FERRITIN, TIBC, IRON, RETICCTPCT in the last 72 hours. ?Microbiology ?Recent Results (from the past 240 hour(s))  ?Blood Culture (routine x 2)     Status: None (Preliminary result)  ? Collection Time: 10/06/21 10:23 PM  ? Specimen: BLOOD  ?Result Value Ref Range Status  ? Specimen Description   Final  ?  BLOOD SITE NOT SPECIFIED ?Performed at [facility], [place] [address] [place], [place] [address] ?  ? Special Requests   Final  ?  BOTTLES DRAWN AEROBIC AND ANAEROBIC Blood Culture adequate volume ?Performed at [facility], [place] [address] [place], [place] [address] ?  ? Culture   Final  ?  NO GROWTH 3 DAYS ?Performed at [facility], [place] [address] [place], [place] [address] ?  ? Report Status PENDING  Incomplete  ?Blood Culture (routine x 2)      Status: None (Preliminary result)  ? Collection Time: 10/06/21 10:23 PM  ? Specimen: BLOOD  ?Result Value Ref Range Status  ? Specimen Description   Final  ?  BLOOD SITE NOT SPECIFIED ?Performed at [facility], [place] [address] [place], [place] [address] ?  ? Special Requests   Final  ?  BOTTLES DRAWN AEROBIC AND ANAEROBIC Blood Culture adequate volume ?Performed at [facility], [place] [address], [place], [place] [address] ?  ? Culture   Final  ?  NO GROWTH 3 DAYS ?Performed at [facility], [place] [address] [place], [place] [address] ?  ? Report Status PENDING  Incomplete  ?Resp Panel by RT-PCR (Flu A&B, Covid) Nasopharyngeal Swab     Status: None  ? Collection Time: 10/07/21  7:31 AM  ? Specimen: Nasopharyngeal Swab; Nasopharyngeal(NP) swabs in vial transport medium  ?Result Value Ref Range Status  ? SARS Coronavirus 2 by RT PCR NEGATIVE NEGATIVE Final  ?  Comment: (NOTE) ?SARS-CoV-2 target nucleic acids are NOT DETECTED. ? ?The SARS-CoV-2 RNA is generally detectable in upper respiratory ?specimens during the acute phase of infection. The lowest ?concentration of SARS-CoV-2 viral copies this assay can detect is ?138 copies/mL. A negative result does not preclude SARS-Cov-2 ?infection and should not be used as the sole basis for treatment or ?other patient management decisions. A negative result may occur with  ?improper specimen collection/handling, submission of specimen other ?than nasopharyngeal swab, presence of viral mutation(s) within the ?areas targeted by this assay, and inadequate number of viral ?copies(<138 copies/mL). A negative result must be combined with ?clinical observations, patient history, and epidemiological ?information. The expected result is Negative. ? ?Fact Sheet for Patients:  ?EntrepreneurPulse.com.au ? ?Fact Sheet for Healthcare Providers:  ?IncredibleEmployment.be ? ?This test is no t yet approved or cleared by the [place] FDA and  ?has been authorized for detection and/or diagnosis of SARS-CoV-2 by ?FDA under an Emergency Use Authorization (EUA). This EUA will remain  ?in effect (meaning this test can be used) for the duration of the ?COVID-19 declaration under Section 564(b)(1) of the Act, 21 ?U.S.C.section 360bbb-3(b)(1), unless the authorization is terminated  ?or revoked sooner.  ? ? ?  ? Influenza A by PCR NEGATIVE NEGATIVE Final  ? Influenza B by PCR NEGATIVE NEGATIVE Final  ?  Comment: (NOTE) ?The Xpert Xpress SARS-CoV-2/FLU/RSV plus assay is intended as an aid ?in the diagnosis of influenza  from Nasopharyngeal swab specimens and ?should not be used as a sole basis for treatment. Nasal washings and ?aspirates are unacceptable for Xpert Xpress SARS-CoV-2/FLU/RSV ?testing. ? ?Fact Sheet for Patients: ?EntrepreneurPulse.com.au ? ?Fact Sheet for Healthcare Providers: ?IncredibleEmployment.be ? ?This test is not yet approved or cleared by the [place] FDA and ?has been authorized for detection and/or diagnosis of SARS-CoV-2 by ?FDA under an Emergency Use Authorization (EUA). This EUA will remain ?in effect (meaning this test can be used) for the duration of the ?COVID-19 declaration under Section 564(b)(1) of the Act, 21 U.S.C. ?section 360bbb-3(b)(1), unless the authorization is terminated or ?revoked. ? ?Performed at [facility], [place] [address] ?[place], [place] [address] ?  ? ? ? ? ?Studies:  ?No results found. ? ?Assessment: 36 y.o. female  ? ?SIRS/possible sepsis, resolved  ?Metastatic colon cancer, on Keytruda ?Mild nausea, anorexia, improved  ?Mild rectal bleeding  ?Moderate anemia and thrombocytosis  ? ? ?Plan:  ?-She is overall doing better, cultures has been negative.  I agree with Dr. Bridgett Larsson to stop her antibiotics. ?-She still has intermittent sinus tachycardia, likely related to her anemia and chronic fatigue/conditioning.  We will give her 1 unit of  blood transfusion today. ?-She will likely go home this weekend.  Discharge plan Per primary care team  ?-I will follow-up as needed before discharge, she has follow-up with me in the clinic on 5/16.  ? ?

## 2021-10-09 NOTE — Assessment & Plan Note (Signed)
Likely due to demand ischemia from sepsis.  High-sensitivity troponin 34 > 50.  No chest pain or EKG changes.   ? ?

## 2021-10-09 NOTE — Assessment & Plan Note (Signed)
Worsened over last 2 days. Discussed oncology. Cannot rule out anemia as partial cause of her tachycardia. Will give 1 unit PRBC. Pt agreeable to PRBC and gives verbal informed consent. Pt has received PRBC before. ?

## 2021-10-09 NOTE — TOC Progression Note (Signed)
Transition of Care (TOC) - Progression Note  ? ? ?Patient Details  ?Name: Mikayla Rios ?MRN: [id number] ?Date of Birth: Jul 18, 1985 ? ?Transition of Care (TOC) CM/SW Contact  ?Purcell Mouton, RN ?Phone Number: ?10/09/2021, 3:07 PM ? ?Clinical Narrative:    ? ? ? ?Transition of Care (TOC) Screening Note ? ? ?Patient Details  ?Name: Mikayla Rios ?Date of Birth: [birth year] ? ? ?Transition of Care (TOC) CM/SW Contact:    ?Purcell Mouton, RN ?Phone Number: ?10/09/2021, 3:07 PM ? ? ? ?Transition of Care Department [facility]) has reviewed patient and no TOC needs have been identified at this time. We will continue to monitor patient advancement through interdisciplinary progression rounds. If new patient transition needs arise, please place a TOC consult. ?  ?  ?  ? ?Expected Discharge Plan and Services ?  ?  ?  ?  ?  ?                ?  ?  ?  ?  ?  ?  ?  ?  ?  ?  ? ? ?Social Determinants of Health (SDOH) Interventions ?  ? ?Readmission Risk Interventions ?   ? View : No data to display.  ?  ?  ?  ? ? ?

## 2021-10-09 NOTE — Subjective & Objective (Signed)
HPI: Mikayla Rios is a 36 y.o. 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  In the ED, patient tachycardic and tachypneic.  Afebrile and not hypotensive or hypoxic.  Labs showing WBC 6.9, hemoglobin 9.4 (stable), platelet count 680k (stable).  Potassium 2.4.  UA not suggestive of infection.  Lactic acid normal.  Blood cultures drawn.  High-sensitivity troponin 34.  EKG showing sinus tachycardia, no acute ischemic changes.  Chest x-ray not suggestive of pneumonia.  CT chest with contrast showing no evidence of pneumonia or metastatic disease; did not comment on PE.  CT abdomen pelvis negative for acute infectious process and showing stable metastatic disease. Patient was given amlodipine, Ativan, metoprolol, potassium supplement, vancomycin, cefepime, and 1 L normal saline bolus. ?  ?Patient states she had Port-A-Cath placed 5 days ago.  Since yesterday she is having lightheadedness and palpitations.  Last night she had orange juice and vomited afterwards.  She has not been able to tolerate any p.o. intake since then.  Denies abdominal pain or diarrhea.  Denies cough, shortness of breath, or chest pain.  Denies any urinary symptoms.  ?

## 2021-10-09 NOTE — Assessment & Plan Note (Signed)
secondary to poor oral intake and vomiting.  Aggressively replenished. Potassium levels normal today at 3.8 ? ?

## 2021-10-09 NOTE — Plan of Care (Signed)

## 2021-10-09 NOTE — Assessment & Plan Note (Addendum)
Multifactorial. Could be due to anemia, her SIRS, chronic tachycardia or anxiety. Given 1 unit pRBC and increased her BB.  ?

## 2021-10-09 NOTE — Assessment & Plan Note (Signed)
Stable. Continue with lopressor and norvasc. Change timing of lopressor to 8 am as pt states she gets up earlier in the AM. ?

## 2021-10-09 NOTE — Assessment & Plan Note (Addendum)
Subjective fever at home, never measured a fever while hospitalized.  ?

## 2021-10-09 NOTE — Assessment & Plan Note (Signed)
Stable. Followed by oncology ?

## 2021-10-10 ENCOUNTER — Inpatient Hospital Stay (HOSPITAL_COMMUNITY): Payer: BC Managed Care – PPO

## 2021-10-10 DIAGNOSIS — R000 Tachycardia, unspecified: Principal | ICD-10-CM

## 2021-10-10 DIAGNOSIS — R6510 Systemic inflammatory response syndrome (SIRS) of non-infectious origin without acute organ dysfunction: Secondary | ICD-10-CM | POA: Diagnosis not present

## 2021-10-10 DIAGNOSIS — R519 Headache, unspecified: Secondary | ICD-10-CM

## 2021-10-10 LAB — CBC WITH DIFFERENTIAL/PLATELET
Abs Immature Granulocytes: 0.04 10*3/uL (ref 0.00–0.07)
Basophils Absolute: 0 10*3/uL (ref 0.0–0.1)
Basophils Relative: 0 %
Eosinophils Absolute: 0.1 10*3/uL (ref 0.0–0.5)
Eosinophils Relative: 2 %
HCT: 30.4 % — ABNORMAL LOW (ref 36.0–46.0)
Hemoglobin: 9.3 g/dL — ABNORMAL LOW (ref 12.0–15.0)
Immature Granulocytes: 1 %
Lymphocytes Relative: 28 %
Lymphs Abs: 1.7 10*3/uL (ref 0.7–4.0)
MCH: 26.3 pg (ref 26.0–34.0)
MCHC: 30.6 g/dL (ref 30.0–36.0)
MCV: 86.1 fL (ref 80.0–100.0)
Monocytes Absolute: 0.4 10*3/uL (ref 0.1–1.0)
Monocytes Relative: 6 %
Neutro Abs: 3.8 10*3/uL (ref 1.7–7.7)
Neutrophils Relative %: 63 %
Platelets: 515 10*3/uL — ABNORMAL HIGH (ref 150–400)
RBC: 3.53 MIL/uL — ABNORMAL LOW (ref 3.87–5.11)
RDW: 16.4 % — ABNORMAL HIGH (ref 11.5–15.5)
WBC: 6 10*3/uL (ref 4.0–10.5)
nRBC: 0 % (ref 0.0–0.2)

## 2021-10-10 LAB — TYPE AND SCREEN
ABO/RH(D): A POS
Antibody Screen: NEGATIVE
Unit division: 0

## 2021-10-10 LAB — BPAM RBC
Blood Product Expiration Date: 202305202359
ISSUE DATE / TIME: 202305051529
Unit Type and Rh: 6200

## 2021-10-10 LAB — COMPREHENSIVE METABOLIC PANEL
ALT: 8 U/L (ref 0–44)
AST: 12 U/L — ABNORMAL LOW (ref 15–41)
Albumin: 2.6 g/dL — ABNORMAL LOW (ref 3.5–5.0)
Alkaline Phosphatase: 50 U/L (ref 38–126)
Anion gap: 7 (ref 5–15)
BUN: 6 mg/dL (ref 6–20)
CO2: 25 mmol/L (ref 22–32)
Calcium: 8.8 mg/dL — ABNORMAL LOW (ref 8.9–10.3)
Chloride: 106 mmol/L (ref 98–111)
Creatinine, Ser: 0.4 mg/dL — ABNORMAL LOW (ref 0.44–1.00)
GFR, Estimated: >60 mL/min (ref >60)
Glucose, Bld: 101 mg/dL — ABNORMAL HIGH (ref 70–99)
Potassium: 3.6 mmol/L (ref 3.5–5.1)
Sodium: 138 mmol/L (ref 135–145)
Total Bilirubin: 0.5 mg/dL (ref 0.3–1.2)
Total Protein: 6.5 g/dL (ref 6.5–8.1)

## 2021-10-10 LAB — MAGNESIUM: Magnesium: 2.1 mg/dL (ref 1.7–2.4)

## 2021-10-10 MED ORDER — GUAIFENESIN ER 600 MG PO TB12
600.0000 mg | ORAL_TABLET | Freq: Two times a day (BID) | ORAL | Status: DC
  Administered 2021-10-10 – 2021-10-11 (×3): 600 mg via ORAL
  Filled 2021-10-10 (×3): qty 1

## 2021-10-10 MED ORDER — METOPROLOL TARTRATE 25 MG PO TABS
75.0000 mg | ORAL_TABLET | Freq: Two times a day (BID) | ORAL | Status: DC
Start: 2021-10-10 — End: 2021-10-11
  Administered 2021-10-10 – 2021-10-11 (×2): 75 mg via ORAL
  Filled 2021-10-10 (×2): qty 1

## 2021-10-10 MED ORDER — TRAMADOL HCL 50 MG PO TABS
50.0000 mg | ORAL_TABLET | Freq: Two times a day (BID) | ORAL | Status: DC | PRN
  Administered 2021-10-10: 50 mg via ORAL
  Filled 2021-10-10: qty 1

## 2021-10-10 MED ORDER — FLUTICASONE PROPIONATE 50 MCG/ACT NA SUSP
2.0000 | Freq: Every day | NASAL | Status: DC
  Administered 2021-10-10 – 2021-10-11 (×2): 2 via NASAL
  Filled 2021-10-10: qty 16

## 2021-10-10 MED ORDER — OXYCODONE-ACETAMINOPHEN 5-325 MG PO TABS
1.0000 | ORAL_TABLET | Freq: Three times a day (TID) | ORAL | Status: DC | PRN
  Administered 2021-10-10 – 2021-10-11 (×3): 1 via ORAL
  Filled 2021-10-10 (×3): qty 1

## 2021-10-10 NOTE — Progress Notes (Signed)
?PROGRESS NOTE ? ? ? ?Mikayla Rios  UYQ:[id number] DOB: Nov 22, 1985 DOA: 10/06/2021 ?PCP: [name], MD  ? ? ? ?Brief Narrative:  ? ?10-07-2021: admitted for SIRS ?10-08-2021: remains afebrile. ?But persistent tachycardia, elevated blood pressure and headache ? ?Subjective: ? ?States did not have fever at home, but did feel tired and had n/v at home ?Reports have been having frontal headache and right side, started after coming to the hospital, reports does not have  headache  normally except from uncontrolled hight blood pressure or seasonal allergies, but reports allergy symptom has not been bad this year ?Does has mild Sinus drainage. No cough, slightly sore throat ?N/v has resolved, ?No stomach pain, having bm ? ? ?Assessment & Plan: ? Principal Problem: ?  SIRS (systemic inflammatory response syndrome) (HCC) ?Active Problems: ?  Sinus tachycardia ?  Anemia ?  Essential hypertension ?  Hypokalemia ?  Elevated troponin ?  H/O colon cancer, stage II ?  Fever ? ? ? ?Assessment and Plan: ? ?* SIRS (systemic inflammatory response syndrome) (HCC)   ?Was on On vancomycin Flagyl and cefepime for broad-spectrum coverage.  Received 2 L of IV fluid bolus in the ED and IV fluids during hospitalization.   ?No obvious source of infection at this time.  Chest x-ray CT scan of the chest without any pneumonia.  Urinalysis was negative for infection. COVID and influenza was negative.  Procalcitonin was less than 0.10 ?Abx discontinued on 5/5, observe off abx ?Reports did not have fever at home ? ? ?Sinus tachycardia ?Multifactorial. Could be due to anemia, her SIRS, chronic tachycardia, timing of lopressor dose.  ?S/p supportive prbc transfusion ?Walked with PT on 5/6, heart rate went up to 139 while walking, had to stop and returned to room ?Increase betablocker as long as bp allows ? ?Essential hypertension ?increase lopressor, as bp remain elevated with tachycardia ? ?Elevated troponin ?Likely due to demand ischemia .   High-sensitivity troponin 34 > 50> 65 on 5/3.  No chest pain or EKG changes.   ? ? ?Hypokalemia ?secondary to poor oral intake and vomiting.  Aggressively replenished. Potassium improved, repeat bmp in am ? ? ? ?Normocytic anemia ?Does not appear to have significant external blood loss ?S/p  1 unit PRBC.  ?Hgb post transfusion is 9.3 ? ?H/O colon cancer, stage II ?Stable. Followed by oncology ? ?Headache:  ?Had tenderness maxillary sinus , right > left ?CT head sinus no significant sinus issues, does show Carious maxillary molars but no superimposed acute dental finding ?identified. 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 ?Needs outpatient follow up ?Prn analgesics ?Bp control ? ? Body mass index is 30.11 kg/m?Marland Kitchen. ? ? ? ? ? ?I have Reviewed nursing notes, Vitals, pain scores, I/o's, Lab results and  imaging results since pt's last encounter, details please see discussion above  ?I ordered the following labs:  ?Unresulted Labs (From admission, onward)  ? ?  Start     Ordered  ? 10/11/21 6387  Basic metabolic panel  Tomorrow morning,   R       ?Question:  Specimen collection method  Answer:  IV Team=IV Team collect  ? 10/10/21 0936  ? 10/11/21 0500  Magnesium  Tomorrow morning,   R       ?Question:  Specimen collection method  Answer:  IV Team=IV Team collect  ? 10/10/21 0936  ? ?  ?  ? ?  ? ? ? ?  DVT prophylaxis: Place and maintain sequential compression device Start: 10/07/21 0542 ? ? ?Code Status:   Code Status: Full Code ? ?Family Communication: patient ?Disposition:  ? ?Status is: Inpatient ? ?  ? ?Dispo: The patient is from: home with family ?             Anticipated d/c is to: home with [facility] ?             Anticipated d/c date is: home tomorrow if bp and heart rate better after increasing metoprolol,  ? ?Antimicrobials:   ?Anti-infectives (From admission, onward)  ? ? Start     Dose/Rate Route Frequency Ordered  Stop  ? 10/08/21 0000  vancomycin (VANCOREADY) IVPB 1250 mg/250 mL  Status:  Discontinued       ? 1,250 mg ?166.7 mL/hr over 90 Minutes Intravenous Every 24 hours 10/07/21 0407 10/09/21 1036  ? 10/07/21 0800  ceFEPIme (MAXIPIME) 2 g in sodium chloride 0.9 % 100 mL IVPB  Status:  Discontinued       ? 2 g ?200 mL/hr over 30 Minutes Intravenous Every 8 hours 10/07/21 0407 10/09/21 1036  ? 10/07/21 0400  metroNIDAZOLE (FLAGYL) IVPB 500 mg  Status:  Discontinued       ? 500 mg ?100 mL/hr over 60 Minutes Intravenous Every 12 hours 10/07/21 0357 10/09/21 1036  ? 10/06/21 2330  vancomycin (VANCOREADY) IVPB 1500 mg/300 mL       ? 1,500 mg ?150 mL/hr over 120 Minutes Intravenous  Once 10/06/21 2317 10/07/21 0329  ? 10/06/21 2330  ceFEPIme (MAXIPIME) 2 g in sodium chloride 0.9 % 100 mL IVPB       ? 2 g ?200 mL/hr over 30 Minutes Intravenous  Once 10/06/21 2318 10/07/21 0136  ? ?  ? ? ? ? ? ? ?Objective: ?Vitals:  ? 10/10/21 1500 10/10/21 1600 10/10/21 1700 10/10/21 1800  ?BP:      ?Pulse:      ?Resp: (!) '22 20 17 18  '$ ?Temp:      ?TempSrc:      ?SpO2:      ?Weight:      ?Height:      ? ? ?Intake/Output Summary (Last 24 hours) at 10/10/2021 1905 ?Last data filed at 10/10/2021 0257 ?Gross per 24 hour  ?Intake 360 ml  ?Output 250 ml  ?Net 110 ml  ? ?Filed Weights  ? 10/06/21 1654  ?Weight: 77.1 kg  ? ? ?Examination: ? ?General exam: alert, awake, communicative,calm, NAD ?Respiratory system:  Respiratory effort normal. ?Cardiovascular system:  RRR.  ?Gastrointestinal system: Abdomen is nondistended, soft and nontender.  Normal bowel sounds  ?Central nervous system: Alert and oriented. No focal neurological deficits. ?Extremities:  no edema ?Skin: No rashes, lesions or ulcers ?Psychiatry: Judgement and insight appear normal. Mood & affect appropriate.  ? ? ? ?Data Reviewed: I have personally reviewed  labs and visualized  imaging studies since the last encounter and formulate the plan  ? ? ? ? ? ? ?Scheduled Meds: ? DULoxetine  30 mg  Oral Daily  ? fluticasone  2 spray Each Nare Daily  ? guaiFENesin  600 mg Oral BID  ? hydrocortisone  25 mg Rectal BID  ? magnesium oxide  400 mg Oral BID  ? metoprolol tartrate  75 mg Oral BID  ? potassium chloride  20 mEq Oral BID  ? ?Continuous Infusions: ? ? LOS: 3 days  ? ? ? ?Florencia Reasons, MD PhD FACP ?[facility] ? ?Available via Epic secure  chat 7am-7pm for nonurgent issues ?Please page for urgent issues ?To page the attending provider between 7A-7P or the covering provider during after hours 7P-7A, please log into the web site [email or web address] and access using universal [place] password for that web site. If you do not have the password, please call the hospital operator. ? ? ? ?10/10/2021, 7:05 PM  ? ? ?

## 2021-10-10 NOTE — Plan of Care (Signed)

## 2021-10-10 NOTE — Evaluation (Signed)
Physical Therapy Evaluation ?Patient Details ?Name: Mikayla Rios ?MRN: [id number] ?DOB: May 25, 1986 ?Today's Date: 10/10/2021 ? ?History of Present Illness ? 36 yo female admitted with N/V, tachycardia, hypokalemia. Dx: SIRS/Sepsis. Hx of colon ca s/p resection 04/2021, obesity, anemia  ?Clinical Impression ? On eval, pt was Supv level for mobility. She walked ~75 feet with 1 standing rest break mid distance 2* elevated HR-HR up to 139 bpm while ambulating. End of session HR once back in bed: 92 bpm. Discussed d/c plan-pt will return home where she lives with her family. She wishes to resume HHPT. Will plan to follow pt during hospital stay.    ?   ? ?Recommendations for follow up therapy are one component of a multi-disciplinary discharge planning process, led by the attending physician.  Recommendations may be updated based on patient status, additional functional criteria and insurance authorization. ? ?Follow Up Recommendations Home health PT ? ?  ?Assistance Recommended at Discharge PRN  ?Patient can return home with the following ? Assist for transportation ? ?  ?Equipment Recommendations None recommended by PT  ?Recommendations for Other Services ?    ?  ?Functional Status Assessment Patient has had a recent decline in their functional status and demonstrates the ability to make significant improvements in function in a reasonable and predictable amount of time.  ? ?  ?Precautions / Restrictions Precautions ?Precautions: Fall ?Precaution Comments: monitor HR ?Restrictions ?Weight Bearing Restrictions: No  ? ?  ? ?Mobility ? Bed Mobility ?Overal bed mobility: Modified Independent ?  ?  ?  ?  ?  ?  ?  ?  ? ?Transfers ?Overall transfer level: Modified independent ?  ?  ?  ?  ?  ?  ?  ?  ?  ?  ? ?Ambulation/Gait ?Ambulation/Gait assistance: Supervision ?Gait Distance (Feet): 75 Feet ?Assistive device: None (vs hallway handrail) ?Gait Pattern/deviations: Step-through pattern, Decreased stride length ?  ?  ?  ?General  Gait Details: Slow, cautious gait. HR up to 139 bpm.1 standing rest break mid distance to allow HR to come down a bit before walking back to room. ? ?Stairs ?  ?  ?  ?  ?  ? ?Wheelchair Mobility ?  ? ?Modified Rankin (Stroke Patients Only) ?  ? ?  ? ?Balance Overall balance assessment: Needs assistance ?  ?  ?  ?  ?  ?Standing balance-Leahy Scale: Fair ?  ?  ?  ?  ?  ?  ?  ?  ?  ?  ?  ?  ?   ? ? ? ?Pertinent Vitals/Pain Pain Assessment ?Pain Assessment: No/denies pain  ? ? ?Home Living Family/patient expects to be discharged to:: Private residence ?Living Arrangements: Spouse/significant other ?Available Help at Discharge: Family;Available PRN/intermittently;Available 24 hours/day ?Type of Home: House ?Home Access: Stairs to enter ?  ?Entrance Stairs-Number of Steps: 1 ?Alternate Level Stairs-Number of Steps: 14 ?Home Layout: Two level;1/2 bath on main level ?Home Equipment: Rolling Walker (2 wheels);BSC/3in1;Tub bench ?   ?  ?Prior Function Prior Level of Function : Needs assist ?  ?  ?  ?  ?  ?  ?Mobility Comments: uses walker PRN. husband drives. ?ADLs Comments: performing ADLs unassisted. ?  ? ? ?Hand Dominance  ?   ? ?  ?Extremity/Trunk Assessment  ? Upper Extremity Assessment ?Upper Extremity Assessment: Overall WFL for tasks assessed ?  ? ?Lower Extremity Assessment ?Lower Extremity Assessment: Generalized weakness ?  ? ?Cervical / Trunk Assessment ?Cervical / Trunk Assessment: Normal  ?  Communication  ? Communication: No difficulties  ?Cognition Arousal/Alertness: Awake/alert ?Behavior During Therapy: [facility] for tasks assessed/performed ?Overall Cognitive Status: Within Functional Limits for tasks assessed ?  ?  ?  ?  ?  ?  ?  ?  ?  ?  ?  ?  ?  ?  ?  ?  ?  ?  ?  ? ?  ?General Comments   ? ?  ?Exercises    ? ?Assessment/Plan  ?  ?PT Assessment Patient needs continued PT services  ?PT Problem List Decreased strength;Decreased mobility;Decreased activity tolerance;Decreased balance ? ?   ?  ?PT Treatment  Interventions DME instruction;Gait training;Therapeutic activities;Therapeutic exercise;Patient/family education;Functional mobility training;Balance training   ? ?PT Goals (Current goals can be found in the Care Plan section)  ?Acute Rehab PT Goals ?Patient Stated Goal: to continue with her HHPT and to continue getting better ?PT Goal Formulation: With patient ?Time For Goal Achievement: 10/24/21 ?Potential to Achieve Goals: Good ? ?  ?Frequency Min 3X/week ?  ? ? ?Co-evaluation   ?  ?  ?  ?  ? ? ?  ?AM-PAC PT "6 Clicks" Mobility  ?Outcome Measure Help needed turning from your back to your side while in a flat bed without using bedrails?: None ?Help needed moving from lying on your back to sitting on the side of a flat bed without using bedrails?: None ?Help needed moving to and from a bed to a chair (including a wheelchair)?: A Little ?Help needed standing up from a chair using your arms (e.g., wheelchair or bedside chair)?: A Little ?Help needed to walk in hospital room?: A Little ?Help needed climbing 3-5 steps with a railing? : A Little ?6 Click Score: 20 ? ?  ?End of Session   ?Activity Tolerance: Patient tolerated treatment well ?Patient left: in bed;with call bell/phone within reach ?  ?PT Visit Diagnosis: Muscle weakness (generalized) (M62.81) ?  ? ?Time: 1645-1700 ?PT Time Calculation (min) (ACUTE ONLY): 15 min ? ? ?Charges:   PT Evaluation ?$PT Eval Low Complexity: 1 Low ?  ?  ?   ? ? ? ? ? ?Toren Tucholski P, PT ?Acute Rehabilitation  ?Office: [phone] ?Pager: [phone] ? ?  ? ?

## 2021-10-11 LAB — BASIC METABOLIC PANEL
Anion gap: 6 (ref 5–15)
BUN: 10 mg/dL (ref 6–20)
CO2: 26 mmol/L (ref 22–32)
Calcium: 9.1 mg/dL (ref 8.9–10.3)
Chloride: 105 mmol/L (ref 98–111)
Creatinine, Ser: 0.47 mg/dL (ref 0.44–1.00)
GFR, Estimated: >60 mL/min (ref >60)
Glucose, Bld: 94 mg/dL (ref 70–99)
Potassium: 4.3 mmol/L (ref 3.5–5.1)
Sodium: 137 mmol/L (ref 135–145)

## 2021-10-11 LAB — CULTURE, BLOOD (ROUTINE X 2)
Culture: NO GROWTH
Culture: NO GROWTH
Special Requests: ADEQUATE
Special Requests: ADEQUATE

## 2021-10-11 LAB — MAGNESIUM: Magnesium: 2.2 mg/dL (ref 1.7–2.4)

## 2021-10-11 MED ORDER — METOPROLOL TARTRATE 75 MG PO TABS: 75.0000 mg | ORAL_TABLET | Freq: Two times a day (BID) | ORAL | 3 refills | Status: DC

## 2021-10-11 MED ORDER — HEPARIN SOD (PORK) LOCK FLUSH 100 UNIT/ML IV SOLN
500.0000 [IU] | INTRAVENOUS | Status: AC | PRN
  Administered 2021-10-11: 500 [IU]
  Filled 2021-10-11: qty 5

## 2021-10-11 NOTE — TOC Transition Note (Signed)
Transition of Care (TOC) - CM/SW Discharge Note ? ? ?Patient Details  ?Name: Mikayla Rios ?MRN: [id number] ?Date of Birth: 17-Sep-1985 ? ?Transition of Care (TOC) CM/SW Contact:  ?[name], RN ?Phone Number: ?10/11/2021, 10:07 AM ? ? ?Clinical Narrative:    ? ? ?Spoke with patient at bedside.  She states that she's been on service with [facility] and would like to continue.  CM sent a message to [facility] with referral.  ? ? ? ?Final next level of care: [place] ?Barriers to Discharge: Barriers Resolved ? ? ?Patient Goals and CMS Choice ?  ?  ?Choice offered to / list presented to : Patient ? ? ?Discharge Plan and Services ?  ?DME Agency: NA ?  ?HH Arranged: PT ?[place] Agency: [facility] ([facility]) ?Date HH Agency Contacted: 10/11/21 ?Time [place]: [id number] ?Representative spoke with at [facility]: Corene Cornea ? ? ? ?

## 2021-10-11 NOTE — Progress Notes (Signed)
Oncology brief note ? ?I stopped by to see Hopie, she is feeling better overall.  She still has intermittent tachycardia.  I reviewed her lab and recent CT scan.  She is expected to be discharged home today.  She has a follow-up with me in the office. I appreciate the excellent care from our hospitalist team. ? ?[name]  ?10/11/2021  ?

## 2021-10-11 NOTE — Progress Notes (Signed)
AVS given to patient and explained at the bedside. Medications and follow up appointments have been explained with pt verbalizing understanding.  

## 2021-10-11 NOTE — Discharge Summary (Addendum)
?Physician Discharge Summary ?  ?Patient: Mikayla Rios MRN: [id number] DOB: 01/06/1986  ?Admit date:     10/06/2021  ?Discharge date: 10/11/21  ?Discharge Physician: Lorelei Pont  ? ?PCP: [name], MD  ? ?Recommendations at discharge:  ? ? Increase metoprolol tartrate to 75 mg BID ? Follow up with oncology and PCP ? ? ?Discharge Diagnoses: ?Principal Problem: ?  SIRS (systemic inflammatory response syndrome) (HCC) ?Active Problems: ?  Sinus tachycardia ?  Anemia ?  Essential hypertension ?  Hypokalemia ?  Elevated troponin ?  H/O colon cancer, stage II ?  Fever ? ?Resolved Problems: ?  * No resolved hospital problems. * ? ?Hospital Course: ?10-07-2021: admitted for [facility] obvious source of infection during hospitalization.  Chest x-ray CT scan of the chest without any pneumonia. COVID and influenza was negative.  Procalcitonin was less than 0.10.  Urinalysis was negative for infection.  On vancomycin Flagyl and cefepime for broad-spectrum coverage.  Received 2 L of IV fluid bolus in the ED and IV fluids during hospitalization.  Cultures remained negative. CT sinuses did not show an obvious source.  ? ?10-08-2021: remains afebrile. ?10-09-21: Stopped IV antibiotics and continued to be afebrile throughout the night and through 10-10-21 ?Discharged without antibiotics given lack of source or symptoms.  ? ?Assessment and Plan: ?* SIRS (systemic inflammatory response syndrome) (HCC) ?No obvious source of infection during hospitalization.  Chest x-ray CT scan of the chest without any pneumonia. COVID and influenza was negative.  Procalcitonin was less than 0.10.  Urinalysis was negative for infection.  On vancomycin Flagyl and cefepime for broad-spectrum coverage.  Received 2 L of IV fluid bolus in the ED and IV fluids during hospitalization.  Cultures remained negative. CT sinuses did not show an obvious source. Stopped IV antibiotics and continued to be afebrile throughout the night. Discharged without antibiotics given  lack of source or symptoms.  ? ?Sinus tachycardia ?Multifactorial. Could be due to anemia, her SIRS, chronic tachycardia or anxiety. Given 1 unit pRBC and increased her BB.  ? ?Fever ?Subjective fever at home, never measured a fever while hospitalized.  ? ?H/O colon cancer, stage II ?Stable. Followed by oncology ? ?Elevated troponin ?Likely due to demand ischemia from sepsis.  High-sensitivity troponin 34 > 50.  No chest pain or EKG changes.   ? ? ?Hypokalemia ?secondary to poor oral intake and vomiting.  Aggressively replenished. Potassium levels normal today at 3.8 ? ? ?Essential hypertension ?Stable. Continue with lopressor and norvasc. Change timing of lopressor to 8 am as pt states she gets up earlier in the AM. ? ?Anemia ?Worsened over last 2 days. Discussed oncology. Cannot rule out anemia as partial cause of her tachycardia. Will give 1 unit PRBC. Pt agreeable to PRBC and gives verbal informed consent. Pt has received PRBC before. ? ? ? ? ?  ? ? ?Consultants: oncology ?Procedures performed: None  ?Disposition: Home ?Diet recommendation:  ?Discharge Diet Orders (From admission, onward)  ? ?  Start     Ordered  ? 10/11/21 0000  Diet general       ? 10/11/21 0951  ? ?  ?  ? ?  ? ?Regular diet ?DISCHARGE MEDICATION: ?Allergies as of 10/11/2021   ? ?   Reactions  ? Chlorhexidine Gluconate Hives, Itching  ? CHG wipes cause itching and hives requiring benadryl  ? Shellfish Allergy Hives  ? Penicillins Hives, Rash  ? Has patient had a PCN reaction causing immediate rash, facial/tongue/throat swelling, SOB or  lightheadedness with hypotension: No ?Has patient had a PCN reaction causing severe rash involving mucus membranes or skin necrosis: hives ?Has patient had a PCN reaction that required hospitalization No ?Has patient had a PCN reaction occurring within the last 10 years: yes ?If all of the above answers are "NO", then may proceed with Cephalosporin use.  ? ?  ? ?  ?Medication List  ?  ? ?STOP taking these  medications   ? ?gabapentin 300 MG capsule ?Commonly known as: NEURONTIN ?  ? ?  ? ?TAKE these medications   ? ?amLODipine 10 MG tablet ?Commonly known as: NORVASC ?Take 10 mg by mouth every evening. ?  ?diclofenac Sodium 1 % Gel ?Commonly known as: VOLTAREN ?Apply 4 g topically 4 (four) times daily as needed (for mild pain). ?  ?DULoxetine 30 MG capsule ?Commonly known as: CYMBALTA ?Take 1 capsule (30 mg total) by mouth daily. ?  ?heparin lock flush 100 UNIT/ML Soln injection ?Use 1 syringe (into each lumen) twice daily ?  ?HYDROmorphone 2 MG tablet ?Commonly known as: Dilaudid ?Take 1-2 tablets (2-4 mg total) by mouth every 4 (four) hours as needed for severe pain or moderate pain. ?  ?lidocaine-prilocaine cream ?Commonly known as: EMLA ?Apply a dime size amount of cream to [facility] prior 45 mins to 1 hr prior to Port-a-cath access as directed. ?  ?Metoprolol Tartrate 75 MG Tabs ?Take 75 mg by mouth 2 (two) times daily. ?What changed:  ?medication strength ?how much to take ?  ?morphine 30 MG 12 hr tablet ?Commonly known as: MS CONTIN ?Take 1 tablet (30 mg total) by mouth every 8 (eight) hours. ?  ?Normal Saline Flush 0.9 % Soln ?Inject 10 mLs (1 syringe) into lumen twice daily as directed ?  ?potassium chloride SA 20 MEQ tablet ?Commonly known as: Klor-Con M20 ?Take 1 tablet (20 mEq total) by mouth 2 (two) times daily. ?  ? ?  ? ? Follow-up Information   ? ? [facility] Follow up.   ? ?  ?  ? ?  ?  ? ?  ? ?Discharge Exam: ?Danley Danker Weights  ? 10/06/21 1654  ?Weight: 77.1 kg  ? ?Physical Exam ?Vitals and nursing note reviewed.  ?Constitutional:   ?   Appearance: Normal appearance. She is not ill-appearing.  ?HENT:  ?   Head: Normocephalic and atraumatic.  ?   Mouth/Throat:  ?   Mouth: Mucous membranes are moist.  ?Cardiovascular:  ?   Rate and Rhythm: Normal rate and regular rhythm.  ?Abdominal:  ?   General: Abdomen is flat. There is no distension.  ?   Palpations: Abdomen is soft. There is no mass.  ?    Tenderness: There is no abdominal tenderness.  ?Musculoskeletal:  ?   Right lower leg: No edema.  ?   Left lower leg: No edema.  ?Skin: ?   General: Skin is warm and dry.  ?   Capillary Refill: Capillary refill takes less than 2 seconds.  ?Neurological:  ?   Mental Status: She is alert.  ?Psychiatric:     ?   Mood and Affect: Mood normal.     ?   Behavior: Behavior normal.  ? ? ? ?Condition at discharge: good ? ?The results of significant diagnostics from this hospitalization (including imaging, microbiology, ancillary and laboratory) are listed below for reference.  ? ?Imaging Studies: ?DG Chest 1 View ? ?Result Date: 09/29/2021 ?CLINICAL DATA:  PICC placement EXAM: CHEST  1 VIEW  COMPARISON:  Chest radiograph 04/16/2021 FINDINGS: The right upper extremity PICC tip projects over the expected location of the confluence with the subclavian and internal jugular veins. The cardiomediastinal silhouette is normal. There is no focal consolidation or pulmonary edema. There is no pleural effusion or pneumothorax There is no acute osseous abnormality. IMPRESSION: PICC tip projects over the expected location of the confluence with subclavian and internal jugular veins. Consider advancement by approximately 6-7 cm. These results will be called to the ordering clinician or representative by the Radiologist Assistant, and communication documented in the PACS or [employer]. Electronically Signed   By: Valetta Mole M.D.   On: 09/29/2021 09:48  ? ?CT CHEST ABDOMEN PELVIS W CONTRAST ? ?Result Date: 10/07/2021 ?CLINICAL DATA:  Possible sepsis, history of colon carcinoma EXAM: CT CHEST, ABDOMEN, AND PELVIS WITH CONTRAST TECHNIQUE: Multidetector CT imaging of the chest, abdomen and pelvis was performed following the standard protocol during bolus administration of intravenous contrast. RADIATION DOSE REDUCTION: This exam was performed according to the departmental dose-optimization program which includes automated exposure control,  adjustment of the mA and/or kV according to patient size and/or use of iterative reconstruction technique. CONTRAST:  11m OMNIPAQUE IOHEXOL 300 MG/ML  SOLN COMPARISON:  Chest x-ray from the previous day, CT fro

## 2021-10-12 ENCOUNTER — Telehealth: Payer: Self-pay

## 2021-10-12 NOTE — Telephone Encounter (Signed)
Transition Care Management Unsuccessful Follow-up Telephone Call ? ?Date of discharge and from where:  [facility]  ? ?Attempts:  1st Attempt ? ?Reason for unsuccessful TCM follow-up call:  Left voice message ? ?  ?

## 2021-10-12 NOTE — Telephone Encounter (Signed)
Transition Care Management Follow-up Telephone Call ?Date of discharge and from where: 10/11/2021 [place]  ?How have you been since you were released from the hospital? Pt states she is a lot better.  ?Any questions or concerns? No ? ?Items Reviewed: ?Did the pt receive and understand the discharge instructions provided? Yes  ?Medications obtained and verified? Yes  ?Other? Yes  ?Any new allergies since your discharge? No  ?Dietary orders reviewed? Yes ?Do you have support at home? Yes  ? ?Home Care and Equipment/Supplies: ?Were home health services ordered? no ?If so, what is the name of the agency? N/a  ?Has the agency set up a time to come to the patient's home? no ?Were any new equipment or medical supplies ordered?  No ?What is the name of the medical supply agency? N/a ?Were you able to get the supplies/equipment? no ?Do you have any questions related to the use of the equipment or supplies? No ? ?Functional Questionnaire: (I = Independent and D = Dependent) ?ADLs: I/d ? ?Bathing/Dressing- I/d ? ?Meal Prep- I/d ? ?Eating- I/d ? ?Maintaining continence- I/d ? ?Transferring/Ambulation- I/d ? ?Managing Meds- I/d ? ?Follow up appointments reviewed: ? ?PCP Hospital f/u appt confirmed? Yes  Scheduled to see robyn sanders  on 10/19/2021  @ 4:20pm virtual. ?[facility] f/u appt confirmed? No  Scheduled to see n/a on n/a @ n/a. ?Are transportation arrangements needed? No  ?If their condition worsens, is the pt aware to call PCP or go to the Emergency Dept.? Yes ?Was the patient provided with contact information for the PCP's office or ED? Yes ?Was to pt encouraged to call back with questions or concerns? Yes  ?

## 2021-10-13 ENCOUNTER — Other Ambulatory Visit (HOSPITAL_COMMUNITY): Payer: Self-pay

## 2021-10-13 DIAGNOSIS — Z6837 Body mass index (BMI) 37.0-37.9, adult: Secondary | ICD-10-CM | POA: Diagnosis not present

## 2021-10-13 DIAGNOSIS — E669 Obesity, unspecified: Secondary | ICD-10-CM | POA: Diagnosis not present

## 2021-10-13 DIAGNOSIS — K5792 Diverticulitis of intestine, part unspecified, without perforation or abscess without bleeding: Secondary | ICD-10-CM | POA: Diagnosis not present

## 2021-10-13 DIAGNOSIS — G4730 Sleep apnea, unspecified: Secondary | ICD-10-CM | POA: Diagnosis not present

## 2021-10-13 DIAGNOSIS — C189 Malignant neoplasm of colon, unspecified: Secondary | ICD-10-CM | POA: Diagnosis not present

## 2021-10-13 DIAGNOSIS — Z933 Colostomy status: Secondary | ICD-10-CM | POA: Diagnosis not present

## 2021-10-13 DIAGNOSIS — Z483 Aftercare following surgery for neoplasm: Secondary | ICD-10-CM | POA: Diagnosis not present

## 2021-10-13 DIAGNOSIS — Z9181 History of falling: Secondary | ICD-10-CM | POA: Diagnosis not present

## 2021-10-13 DIAGNOSIS — I10 Essential (primary) hypertension: Secondary | ICD-10-CM | POA: Diagnosis not present

## 2021-10-13 DIAGNOSIS — Z9049 Acquired absence of other specified parts of digestive tract: Secondary | ICD-10-CM | POA: Diagnosis not present

## 2021-10-13 DIAGNOSIS — Z791 Long term (current) use of non-steroidal anti-inflammatories (NSAID): Secondary | ICD-10-CM | POA: Diagnosis not present

## 2021-10-13 DIAGNOSIS — Z79891 Long term (current) use of opiate analgesic: Secondary | ICD-10-CM | POA: Diagnosis not present

## 2021-10-13 DIAGNOSIS — E876 Hypokalemia: Secondary | ICD-10-CM | POA: Diagnosis not present

## 2021-10-13 DIAGNOSIS — E440 Moderate protein-calorie malnutrition: Secondary | ICD-10-CM | POA: Diagnosis not present

## 2021-10-14 ENCOUNTER — Other Ambulatory Visit (HOSPITAL_COMMUNITY): Payer: Self-pay

## 2021-10-15 ENCOUNTER — Encounter: Payer: Self-pay | Admitting: Licensed Clinical Social Worker

## 2021-10-15 DIAGNOSIS — S31109A Unspecified open wound of abdominal wall, unspecified quadrant without penetration into peritoneal cavity, initial encounter: Secondary | ICD-10-CM | POA: Diagnosis not present

## 2021-10-15 DIAGNOSIS — Z4801 Encounter for change or removal of surgical wound dressing: Secondary | ICD-10-CM | POA: Diagnosis not present

## 2021-10-15 DIAGNOSIS — C187 Malignant neoplasm of sigmoid colon: Secondary | ICD-10-CM

## 2021-10-15 DIAGNOSIS — Z933 Colostomy status: Secondary | ICD-10-CM | POA: Diagnosis not present

## 2021-10-15 DIAGNOSIS — Z85038 Personal history of other malignant neoplasm of large intestine: Secondary | ICD-10-CM | POA: Diagnosis not present

## 2021-10-15 NOTE — Progress Notes (Signed)
[facility] CSW Progress Note ? ?Clinical Social Worker contacted patient by phone to discuss financial concerns.  Pt previously assessed at initial diagnosis.  Pt reports her disability application which was submitted in March is still under review.  Pt's spouse is currently working; however, the couple continues to struggle financially.  Pt previously referred to financial services for the [employer] as well as [facility].  CSW emailed pt w/ list and contact details for several local organizations which offer financial assistance.  Pt also provided w/ contact details for the [name] as well as the [facility].  Pt given contact details for CSW who will continue to follow pt as appropriate.   ? ? ?Henriette Combs, LCSW  ?

## 2021-10-18 DIAGNOSIS — Z4801 Encounter for change or removal of surgical wound dressing: Secondary | ICD-10-CM | POA: Diagnosis not present

## 2021-10-18 DIAGNOSIS — Z85038 Personal history of other malignant neoplasm of large intestine: Secondary | ICD-10-CM | POA: Diagnosis not present

## 2021-10-18 DIAGNOSIS — Z933 Colostomy status: Secondary | ICD-10-CM | POA: Diagnosis not present

## 2021-10-18 DIAGNOSIS — S31109A Unspecified open wound of abdominal wall, unspecified quadrant without penetration into peritoneal cavity, initial encounter: Secondary | ICD-10-CM | POA: Diagnosis not present

## 2021-10-19 ENCOUNTER — Encounter: Payer: Self-pay | Admitting: Internal Medicine

## 2021-10-19 ENCOUNTER — Telehealth (INDEPENDENT_AMBULATORY_CARE_PROVIDER_SITE_OTHER): Payer: BC Managed Care – PPO | Admitting: Internal Medicine

## 2021-10-19 VITALS — BP 116/90 | HR 107 | Ht 63.0 in | Wt 165.0 lb

## 2021-10-19 DIAGNOSIS — B999 Unspecified infectious disease: Secondary | ICD-10-CM

## 2021-10-19 DIAGNOSIS — E876 Hypokalemia: Secondary | ICD-10-CM

## 2021-10-19 DIAGNOSIS — R000 Tachycardia, unspecified: Secondary | ICD-10-CM | POA: Diagnosis not present

## 2021-10-19 DIAGNOSIS — R6510 Systemic inflammatory response syndrome (SIRS) of non-infectious origin without acute organ dysfunction: Secondary | ICD-10-CM | POA: Diagnosis not present

## 2021-10-19 DIAGNOSIS — I10 Essential (primary) hypertension: Secondary | ICD-10-CM | POA: Diagnosis not present

## 2021-10-19 NOTE — Patient Instructions (Signed)
Sinus Tachycardia  Sinus tachycardia is a kind of fast heartbeat. In sinus tachycardia, the heart beats more than 100 times a minute. Sinus tachycardia starts in a part of the heart called the sinus node. Sinus tachycardia may be harmless, or it may be a sign of a serious condition. What are the causes? This condition may be caused by: Exercise or exertion. A fever. Pain. Loss of body fluids (dehydration). Severe bleeding (hemorrhage). Anxiety and stress. Certain substances, including: Alcohol. Caffeine. Tobacco and nicotine products. Cold medicines. Illegal drugs. Medical conditions including: Heart disease. An infection. An overactive thyroid (hyperthyroidism). A lack of red blood cells (anemia). What are the signs or symptoms? Symptoms of this condition include: A feeling that the heart is beating quickly (palpitations). Suddenly noticing your heartbeat (cardiac awareness). Dizziness. Tiredness (fatigue). Shortness of breath. Chest pain. Nausea. Fainting. How is this diagnosed? This condition is diagnosed with: A physical exam. Other tests, such as: Blood tests. An electrocardiogram (ECG). This test measures the electrical activity of the heart. Ambulatory cardiac monitor. This records your heartbeats for 24 hours or more. You may be referred to a heart specialist (cardiologist). How is this treated? Treatment for this condition depends on the cause or the underlying condition. Treatment may involve: Treating the underlying condition. Taking new medicines or changing your current medicines as told by your health care provider. Making changes to your diet or lifestyle. Follow these instructions at home: Lifestyle  Do not use any products that contain nicotine or tobacco, such as cigarettes and e-cigarettes. If you need help quitting, ask your health care provider. Do not use illegal drugs, such as cocaine. Learn relaxation methods to help you when you get stressed  or anxious. These include deep breathing. Avoid caffeine or other stimulants. Alcohol use  Do not drink alcohol if: Your health care provider tells you not to drink. You are pregnant, may be pregnant, or are planning to become pregnant. If you drink alcohol, limit how much you have: 0-1 drink a day for women. 0-2 drinks a day for men. Be aware of how much alcohol is in your drink. In the [place] one drink equals one typical bottle of beer (12 oz), one-half glass of wine (5 oz), or one shot of hard liquor (1 oz). General instructions Drink enough fluids to keep your urine pale yellow. Take over-the-counter and prescription medicines only as told by your health care provider. Keep all follow-up visits as told by your health care provider. This is important. Contact a health care provider if you have: A fever. Vomiting or diarrhea that does not go away. Get help right away if you: Have pain in your chest, upper arms, jaw, or neck. Become weak or dizzy. Feel faint. Have palpitations that do not go away. Summary In sinus tachycardia, the heart beats more than 100 times a minute. Sinus tachycardia may be harmless, or it may be a sign of a serious condition. Treatment for this condition depends on the cause or the underlying condition. Get help right away if you have pain in your chest, upper arms, jaw, or neck. This information is not intended to replace advice given to you by your health care provider. Make sure you discuss any questions you have with your health care provider. Document Revised: 10/02/2020 Document Reviewed: 10/02/2020 Elsevier Patient Education  2023 [employer]  

## 2021-10-19 NOTE — Progress Notes (Signed)
Virtual Visit via Video   This visit type was conducted due to national recommendations for restrictions regarding the COVID-19 Pandemic (e.g. social distancing) in an effort to limit this patient's exposure and mitigate transmission in our community.  Due to her co-morbid illnesses, this patient is at least at moderate risk for complications without adequate follow up.  This format is felt to be most appropriate for this patient at this time.  All issues noted in this document were discussed and addressed.  A limited physical exam was performed with this format.    This visit type was conducted due to national recommendations for restrictions regarding the COVID-19 Pandemic (e.g. social distancing) in an effort to limit this patient's exposure and mitigate transmission in our community.  Patients identity confirmed using two different identifiers.  This format is felt to be most appropriate for this patient at this time.  All issues noted in this document were discussed and addressed.  No physical exam was performed (except for noted visual exam findings with Video Visits).    Date:  11/02/2021   ID:  Mikayla Rios, DOB 1986/01/02, MRN [id number]  Patient Location:  Home  Provider location:   Office    Chief Complaint:  "I have hospital f/u"  History of Present Illness:    Mikayla Rios is a 36 y.o. female who presents via video conferencing for a telehealth visit today.    The patient does not have symptoms concerning for COVID-19 infection (fever, chills, cough, or new shortness of breath).   Admitted: 10/07/21 [facility] Discharge: 10/11/21  She presents today for virtual visit for hospital f/u.  She presented to ED on 10/07/21 with complaints of fevers, chills, palpitations, and malaise.  She has not tolerated any po intake since yesterday. In the ED, patient was found to be tachycardic and tachypneic.  Afebrile and not hypotensive or hypoxic.  Workup neg for leukocytosis,  low potassium. UA not suggestive of infection. EKG showed sinus tachycardia, without acute ischemic changes.  Chest x-ray not suggestive of pneumonia.  CT chest with contrast showing no evidence of pneumonia or metastatic disease; did not comment on PE.  CT abdomen pelvis negative for acute infectious process and showing stable metastatic disease.  She stated she had Port-A-Cath placed 5 days prior to ER presentation.    She was admitted for SIRS. There was no obvious source of infection during hospitalization.  Chest x-ray and CT scan of the chest without any pneumonia. COVID and influenza was negative.   Urinalysis was negative for infection.  She was initially treated with vancomycin, flagyl and cefepime for broad-spectrum coverage.  She also received 2 L of IV fluid bolus in the ED and IV fluids during hospitalization.  Cultures remained negative. CT sinuses did not show an obvious source.   Since discharge, she is feeling much better. She has had one episode of palpitations.       Past Medical History:  Diagnosis Date   Allergy    seasonal   Anemia    Blood infection 1985   Blood transfusion without reported diagnosis    Diverticulitis    Family history of breast cancer    Family history of colon cancer    Family history of stomach cancer    Hypertension    Obesity    Sleep apnea    Past Surgical History:  Procedure Laterality Date   COLECTOMY WITH COLOSTOMY CREATION/HARTMANN PROCEDURE N/A 04/15/2021   Procedure: COLOSTOMY CREATION/HARTMANN PROCEDURE;  Surgeon: [name], MD;  Location: [facility];  Service: General;  Laterality: N/A;   IR CATHETER TUBE CHANGE  12/12/2020   IR CATHETER TUBE CHANGE  01/27/2021   IR CATHETER TUBE CHANGE  02/19/2021   IR CATHETER TUBE CHANGE  04/13/2021   IR IMAGING GUIDED PORT INSERTION  10/02/2021   IR RADIOLOGIST EVAL & MGMT  11/27/2020   IR RADIOLOGIST EVAL & MGMT  12/11/2020   IR RADIOLOGIST EVAL & MGMT  01/07/2021   IR RADIOLOGIST EVAL & MGMT   02/04/2021   IR SINUS/FIST TUBE CHK-NON GI  12/12/2020   IR SINUS/FIST TUBE CHK-NON GI  02/19/2021   LAPAROSCOPIC PARTIAL COLECTOMY N/A 04/15/2021   Procedure: LAPAROSCOPIC ASSISTED HARTMANN RESECTION;  Surgeon: [name], MD;  Location: [facility];  Service: General;  Laterality: N/A;     Current Meds  Medication Sig   amLODipine (NORVASC) 10 MG tablet Take 10 mg by mouth every evening.   metoprolol tartrate 75 MG TABS Take 75 mg by mouth 2 (two) times daily.   [DISCONTINUED] potassium chloride SA (KLOR-CON M20) 20 MEQ tablet Take 1 tablet (20 mEq total) by mouth 2 (two) times daily.     Allergies:   Chlorhexidine gluconate, Shellfish allergy, and Penicillins   Social History   Tobacco Use   Smoking status: Never   Smokeless tobacco: Never  Vaping Use   Vaping Use: Never used  Substance Use Topics   Alcohol use: Not Currently   Drug use: No     Family Hx: The patient's family history includes Autism in her son; Cancer in her mother, paternal grandfather, and paternal uncle; Colon cancer in her mother; Colon polyps in her half-sister; Congestive Heart Failure in her father; Diabetes in her maternal grandfather, maternal grandmother, mother, and paternal grandmother; Heart disease in her paternal aunt; Hypertension in her father, maternal grandmother, and mother; Kidney disease in her maternal grandfather; Stomach cancer in her maternal uncle.  ROS:   Please see the history of present illness.    Review of Systems  Constitutional: Negative.   Respiratory: Negative.    Cardiovascular:  Positive for palpitations.  Gastrointestinal: Negative.   Neurological: Negative.   Psychiatric/Behavioral: Negative.     All other systems reviewed and are negative.   Labs/Other Tests and Data Reviewed:    Recent Labs: 10/20/2021: ALT 6; BUN 13; Creatinine 0.55; Hemoglobin 8.0; Magnesium 1.2; Platelet Count 525; Potassium 3.0; Sodium 143; TSH 1.647   Recent Lipid Panel Lab Results   Component Value Date/Time   CHOL 155 10/02/2019 11:27 AM   TRIG 71 04/21/2021 06:00 AM   HDL 39 (L) 10/02/2019 11:27 AM   CHOLHDL 4.0 10/02/2019 11:27 AM   LDLCALC 99 10/02/2019 11:27 AM    Wt Readings from Last 3 Encounters:  10/20/21 176 lb 1.6 oz (79.9 kg)  10/19/21 165 lb (74.8 kg)  10/06/21 170 lb (77.1 kg)     Exam:    Vital Signs:  BP 116/90   Pulse (!) 107   Ht '5\' 3"'$  (1.6 m)   Wt 165 lb (74.8 kg)   BMI 29.23 kg/m     Physical Exam Vitals and nursing note reviewed.  HENT:     Head: Normocephalic and atraumatic.  Eyes:     Extraocular Movements: Extraocular movements intact.  Pulmonary:     Effort: Pulmonary effort is normal.  Musculoskeletal:     Cervical back: Normal range of motion.  Neurological:     Mental Status: She is alert and  oriented to person, place, and time.  Psychiatric:        Mood and Affect: Affect normal.    ASSESSMENT & PLAN:   1. SIRS (systemic inflammatory response syndrome) (HCC) Comments: No source of infection was identified.  She is encouraged to notify me if she develops fever/chills.  TCM PERFORMED. A MEMBER OF THE CLINICAL TEAM SPOKE WITH THE PATIENT UPON DISCHARGE. DISCHARGE SUMMARY WAS REVIEWED IN FULL DETAIL DURING THE VISIT. MEDS RECONCILED AND COMPARED TO DISCHARGE MEDS. MEDICATION LIST WAS UPDATED AND REVIEWED WITH THE PATIENT. GREATER THAN 50% FACE TO FACE TIME WAS SPENT IN COUNSELING AND COORDINATION OF CARE. ALL QUESTIONS WERE ANSWERED TO THE SATISFACTION OF THE PATIENT.    2. Hypokalemia Comments: Resolved, given replacement therapy during hospitalization.   3. Essential hypertension Comments: Chronic, encouraged to follow a low sodium diet. Also encouraged to avoid processed meats including bacon, sausages and deli meats due to high sodium content.   4. Sinus tachycardia Comments: I will check Mg levels. She agrees to have lab visit. She may benefit from Mg supplementation. Dose of Bblocker was increased during her  hospitalization, she will c/w metoprolol tartrate '75mg'$  twice daily. She is encouraged to stay well hydrated.  - Magnesium; Future    COVID-19 Education: The signs and symptoms of COVID-19 were discussed with the patient and how to seek care for testing (follow up with PCP or arrange E-visit).  The importance of social distancing was discussed today.  Patient Risk:   After full review of this patients clinical status, I feel that they are at least moderate risk at this time.  Time:   Today, I have spent 22 minutes/ seconds with the patient with telehealth technology discussing above diagnoses.     Medication Adjustments/Labs and Tests Ordered: Current medicines are reviewed at length with the patient today.  Concerns regarding medicines are outlined above.   Tests Ordered: Orders Placed This Encounter  Procedures   Magnesium    Medication Changes: No orders of the defined types were placed in this encounter.   Disposition:  Follow up in 3 month(s)  Signed, Maximino Greenland, MD

## 2021-10-20 ENCOUNTER — Other Ambulatory Visit: Payer: Self-pay | Admitting: Internal Medicine

## 2021-10-20 ENCOUNTER — Inpatient Hospital Stay (HOSPITAL_BASED_OUTPATIENT_CLINIC_OR_DEPARTMENT_OTHER): Payer: BC Managed Care – PPO | Admitting: Hematology

## 2021-10-20 ENCOUNTER — Encounter: Payer: Self-pay | Admitting: Hematology

## 2021-10-20 ENCOUNTER — Inpatient Hospital Stay: Payer: BC Managed Care – PPO

## 2021-10-20 ENCOUNTER — Other Ambulatory Visit: Payer: Self-pay

## 2021-10-20 ENCOUNTER — Other Ambulatory Visit: Payer: Self-pay | Admitting: *Deleted

## 2021-10-20 ENCOUNTER — Inpatient Hospital Stay: Payer: BC Managed Care – PPO | Attending: Hematology

## 2021-10-20 VITALS — BP 118/82 | HR 96 | Temp 98.6°F | Resp 18 | Ht 63.0 in | Wt 176.1 lb

## 2021-10-20 DIAGNOSIS — R000 Tachycardia, unspecified: Secondary | ICD-10-CM | POA: Diagnosis not present

## 2021-10-20 DIAGNOSIS — D259 Leiomyoma of uterus, unspecified: Secondary | ICD-10-CM | POA: Insufficient documentation

## 2021-10-20 DIAGNOSIS — K760 Fatty (change of) liver, not elsewhere classified: Secondary | ICD-10-CM | POA: Insufficient documentation

## 2021-10-20 DIAGNOSIS — R5383 Other fatigue: Secondary | ICD-10-CM | POA: Diagnosis not present

## 2021-10-20 DIAGNOSIS — C187 Malignant neoplasm of sigmoid colon: Secondary | ICD-10-CM | POA: Insufficient documentation

## 2021-10-20 DIAGNOSIS — Z79899 Other long term (current) drug therapy: Secondary | ICD-10-CM | POA: Insufficient documentation

## 2021-10-20 DIAGNOSIS — D649 Anemia, unspecified: Secondary | ICD-10-CM | POA: Diagnosis not present

## 2021-10-20 DIAGNOSIS — R630 Anorexia: Secondary | ICD-10-CM | POA: Insufficient documentation

## 2021-10-20 DIAGNOSIS — E876 Hypokalemia: Secondary | ICD-10-CM | POA: Diagnosis not present

## 2021-10-20 DIAGNOSIS — F32A Depression, unspecified: Secondary | ICD-10-CM | POA: Diagnosis not present

## 2021-10-20 DIAGNOSIS — K6819 Other retroperitoneal abscess: Secondary | ICD-10-CM | POA: Diagnosis not present

## 2021-10-20 DIAGNOSIS — D3502 Benign neoplasm of left adrenal gland: Secondary | ICD-10-CM | POA: Insufficient documentation

## 2021-10-20 DIAGNOSIS — J90 Pleural effusion, not elsewhere classified: Secondary | ICD-10-CM | POA: Diagnosis not present

## 2021-10-20 DIAGNOSIS — K8020 Calculus of gallbladder without cholecystitis without obstruction: Secondary | ICD-10-CM | POA: Diagnosis not present

## 2021-10-20 DIAGNOSIS — Z5112 Encounter for antineoplastic immunotherapy: Secondary | ICD-10-CM | POA: Insufficient documentation

## 2021-10-20 DIAGNOSIS — Z452 Encounter for adjustment and management of vascular access device: Secondary | ICD-10-CM

## 2021-10-20 LAB — CBC WITH DIFFERENTIAL (CANCER CENTER ONLY)
Abs Immature Granulocytes: 0.03 10*3/uL (ref 0.00–0.07)
Basophils Absolute: 0 10*3/uL (ref 0.0–0.1)
Basophils Relative: 0 %
Eosinophils Absolute: 0.1 10*3/uL (ref 0.0–0.5)
Eosinophils Relative: 1 %
HCT: 25.5 % — ABNORMAL LOW (ref 36.0–46.0)
Hemoglobin: 8 g/dL — ABNORMAL LOW (ref 12.0–15.0)
Immature Granulocytes: 0 %
Lymphocytes Relative: 19 %
Lymphs Abs: 1.6 10*3/uL (ref 0.7–4.0)
MCH: 26.3 pg (ref 26.0–34.0)
MCHC: 31.4 g/dL (ref 30.0–36.0)
MCV: 83.9 fL (ref 80.0–100.0)
Monocytes Absolute: 0.4 10*3/uL (ref 0.1–1.0)
Monocytes Relative: 5 %
Neutro Abs: 6.2 10*3/uL (ref 1.7–7.7)
Neutrophils Relative %: 75 %
Platelet Count: 525 10*3/uL — ABNORMAL HIGH (ref 150–400)
RBC: 3.04 MIL/uL — ABNORMAL LOW (ref 3.87–5.11)
RDW: 15.4 % (ref 11.5–15.5)
WBC Count: 8.3 10*3/uL (ref 4.0–10.5)
nRBC: 0 % (ref 0.0–0.2)

## 2021-10-20 LAB — CMP (CANCER CENTER ONLY)
ALT: 6 U/L (ref 0–44)
AST: 8 U/L — ABNORMAL LOW (ref 15–41)
Albumin: 3.1 g/dL — ABNORMAL LOW (ref 3.5–5.0)
Alkaline Phosphatase: 42 U/L (ref 38–126)
Anion gap: 5 (ref 5–15)
BUN: 13 mg/dL (ref 6–20)
CO2: 27 mmol/L (ref 22–32)
Calcium: 8.2 mg/dL — ABNORMAL LOW (ref 8.9–10.3)
Chloride: 111 mmol/L (ref 98–111)
Creatinine: 0.55 mg/dL (ref 0.44–1.00)
GFR, Estimated: >60 mL/min (ref >60)
Glucose, Bld: 98 mg/dL (ref 70–99)
Potassium: 3 mmol/L — ABNORMAL LOW (ref 3.5–5.1)
Sodium: 143 mmol/L (ref 135–145)
Total Bilirubin: 0.2 mg/dL — ABNORMAL LOW (ref 0.3–1.2)
Total Protein: 6.4 g/dL — ABNORMAL LOW (ref 6.5–8.1)

## 2021-10-20 LAB — CEA (IN HOUSE-CHCC): CEA (CHCC-In House): 5.54 ng/mL — ABNORMAL HIGH (ref 0.00–5.00)

## 2021-10-20 LAB — MAGNESIUM: Magnesium: 1.2 mg/dL — ABNORMAL LOW (ref 1.7–2.4)

## 2021-10-20 LAB — FERRITIN: Ferritin: 21 ng/mL (ref 11–307)

## 2021-10-20 LAB — TSH: TSH: 1.647 u[IU]/mL (ref 0.350–4.500)

## 2021-10-20 MED ORDER — SODIUM CHLORIDE 0.9% FLUSH
10.0000 mL | Freq: Once | INTRAVENOUS | Status: AC
  Administered 2021-10-20: 10 mL

## 2021-10-20 MED ORDER — POTASSIUM CHLORIDE 10 MEQ/100ML IV SOLN: 10.0000 meq | Freq: Once | INTRAVENOUS | Status: DC

## 2021-10-20 MED ORDER — SODIUM CHLORIDE 0.9 % IV SOLN: Freq: Once | INTRAVENOUS | Status: AC

## 2021-10-20 MED ORDER — POTASSIUM CHLORIDE 10 MEQ/100ML IV SOLN
10.0000 meq | Freq: Once | INTRAVENOUS | Status: AC
  Administered 2021-10-20: 10 meq via INTRAVENOUS
  Filled 2021-10-20: qty 100

## 2021-10-20 MED ORDER — SODIUM CHLORIDE 0.9 % IV SOLN
200.0000 mg | Freq: Once | INTRAVENOUS | Status: AC
  Administered 2021-10-20: 200 mg via INTRAVENOUS
  Filled 2021-10-20: qty 200

## 2021-10-20 MED ORDER — SODIUM CHLORIDE 0.9% FLUSH
10.0000 mL | INTRAVENOUS | Status: DC | PRN
  Administered 2021-10-20: 10 mL

## 2021-10-20 MED ORDER — HEPARIN SOD (PORK) LOCK FLUSH 100 UNIT/ML IV SOLN
500.0000 [IU] | Freq: Once | INTRAVENOUS | Status: AC | PRN
  Administered 2021-10-20: 500 [IU]

## 2021-10-20 NOTE — Progress Notes (Signed)
?[place]   ?[phone] [phone]   ?Clinic Follow up Note  ? ?Patient Care Team: ?[name], MD as PCP - General (Internal Medicine) ?[name], MD as PCP - Cardiology (Cardiology) ?[name], [name] as Librarian, academic (Emergency Medicine) ?[name], MD as Consulting Physician (Oncology) ?Earl Gala, Deliah Goody, RN as Sales executive (Oncology) ?[name], MD as Consulting Physician (General Surgery) ?[name], [name], MD as Consulting Physician (Neurology) ?Cheri Fowler, MD as Consulting Physician (Obstetrics and Gynecology) ?[name], MD as Consulting Physician (Gastroenterology) ?[name], [name], NP as Nurse Practitioner (Nurse Practitioner) ? ?Date of Service:  10/20/2021 ? ?CHIEF COMPLAINT: f/u of sigmoid colon cancer ? ?CURRENT THERAPY:  ?Keytruda, q21d, started 09/08/21 ? ?ASSESSMENT & PLAN:  ?Mikayla Rios is a 36 y.o. female with  ? ?1. 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 She continued to have abdominal pain and drainage, undergoing drain exchange three times. ?-she developed worsening weakness and was admitted on 04/10/21. She was taken for emergent small bowel and left colon resection on 04/15/21 under Dr. Johney Maine. 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 Margins negative for invasive carcinoma, but low-grade dysplasia involves proximal margin. All 5 lymph nodes negative (0/5). MSI low. ?-repeat CT AP on 06/16/21 showed progressive metastatic disease involving soft tissue masses in left psoas muscle and left lateral abdominal wall and peritoneal masses, and new mild  retroperitoneal lymphadenopathy.  ?-baseline CEA on 06/22/21 was elevated at 87.41. ?-she started FOLFOX on 06/22/21 but did not respond ?-she switched to [facility] on 09/08/21. She experienced loss of appetite and moderate fatigue for the first week, but she greatly improved by the second week. She overall feels better with much improved abdominal pain  ?-she was admitted on 10/06/21 for chills, fatigue, decreased appetite, and mild nausea to rule out sepsis. Cultures were negative. She also received 1u pRBC. ?-labs reviewed, overall stable. We will proceed with third dose Keytruda as scheduled.  ?-Plan to repeat CT scan in August ?  ?2. Anxiety and depression ?-Due to the prolonged illness and multiple hospital stay since May 2022, and cancer diagnosis, she has been feeling depressed lately. ?-cymbalta prescribed 05/25/21 ?-She had panic attack 2/13 after chemo, when she was reacting to clorhexidine.  ?-F/up Dr. Michail Sermon ?  ?3. Genetics, FAP ?-FO showed MSI-High disease, and showed multiple other mutations including APC, [name]6, and BRCA2 mutations.  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  ?-I indicated her children should seek testing, when appropriate. Her 66 yo son is eligible now, and possibly her 20 yo daughter who is mature. 56 yo child may be too young  ?  ?4. Social Support ?-she is connected with Education officer, museum ?-she is working to apply for disability. Her job ended just prior to the start of her symptoms. ?-she has three children-- ages 65, 38, and 38. ?  ?5. Moderate anemia and thrombocytopenia  ?-history of IDA from her colon cancer, on oral iron, recent ferritin normal ?-thrombocytopenia is likely reactive, secondary to her malignancy  ?-blood transfusion if Hg<8.0 ?-she received 1u in the hospital 10/09/21. ?-hgb 8 today (10/20/21) ? ?  ?PLAN: ?-proceed with third  dose Keytruda ?-lab, PICC flush, f/u, and Keytruda treatment in 3  weeks  ? ? ?No problem-specific Assessment & Plan notes found for this encounter. ? ? ?SUMMARY OF ONCOLOGIC HISTORY: ?Oncology History Overview Note  ? Cancer Staging  ?Malignant neoplasm of sigmoid colon ([place]) ?Staging form: Colon and Rectum, AJCC 8th Edition ?- Pathologic stage from 04/15/2021: Stage IIC (pT4b, pN0, cM0) - Signed by [name], MD on 06/12/2021 ? ?  ?Malignant neoplasm of sigmoid colon ([place])  ? Initial Diagnosis  ? Malignant neoplasm of sigmoid colon [facility]) ?  ?11/04/2020 Imaging  ? EXAM: ?CT ABDOMEN AND PELVIS WITHOUT CONTRAST ? ?IMPRESSION: ?1. Perforating descending colonic diverticulitis with multiple ?abdominopelvic gas and fluid collections, measuring up to 5.7 cm and further described above. ?2. Cholelithiasis without findings of acute cholecystitis. ?3. 3.6 cm benign left adrenal adenoma. ?4. Leiomyomatous uterus. ?5. 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 ?  ?11/07/2020 Imaging  ? EXAM: ?CT ABDOMEN AND PELVIS WITHOUT CONTRAST ? ?IMPRESSION: ?Continued wall thickening is seen involving descending colon ?suggesting infectious or inflammatory colitis or perforated ?diverticulitis. There is an adjacent fluid collection measuring 7.0 ?x 5.0 cm consistent with abscess which is significantly enlarged ?compared to prior exam. Adjacent to the abscess, there is a severely thickened small bowel loop most consistent with secondary inflammation. ?  ?5.2 x 3.5 cm fluid collection is noted within the left psoas muscle ?consistent with abscess which is significantly enlarged compared to prior exam. 4.4 x 3.8 cm fluid collection consistent with abscess is noted in the left retroperitoneal region which is also enlarged ?compared to prior exam. ?  ?Fibroid uterus. ?  ?Cholelithiasis. ?  ?11/27/2020 Imaging  ? EXAM: ?CT ABDOMEN AND PELVIS WITHOUT CONTRAST ? ?IMPRESSION: ?1. Significantly interval decreased size of the  previously ?visualized left retroperitoneal and left anterior mid abdominal ?fluid collections. There is persistent fat stranding and mural ?thickening of the mid descending colon at this level. ?2. Unchanged leiomyomatous uterus. ?3. Unchanged cholelithiasis. ?  ?12/11/2020 Imaging  ? EXAM: ?CT ABDOMEN AND PELVIS WITHOUT CONTRAST ? ?IMPRESSION: ?1. Stable inflammatory changes centered around the distal descending colon in the left lower abdomen. Pericolonic inflammatory changes have minimally changed since 11/27/2020. Small pocket of gas medial to the colon is probably associated with a fistula or small residual abscess collection. ?2. Stable position of the two percutaneous drains. The more anterior drain may be extending through a portion of the small bowel. ?3. Cholelithiasis. ?4. Fibroid uterus. Cannot exclude an ovarian/adnexal cystic ?structure near the uterine fundus. ?5. Left adrenal adenoma. ?  ?01/07/2021 Imaging  ? EXAM: ?CT ABDOMEN AND PELVIS WITH CONTRAST ? ?IMPRESSION: ?1. No new abscesses identified. Similar degree of soft tissue ?thickening seen in the left pericolonic region. The degree of ?persistent soft tissues thickening in the pericolonic space further ?raises suspicions for malignancy. Further evaluation with ?colonoscopy should be performed. ?2. Left retroperitoneal abscess drain unchanged in position. ?Anterior left abdominal drain again seen terminating within small ?bowel loop. ?3. 2.6 cm mildly sclerotic lesion noted in the L2 vertebral body. ?Further evaluation with contrast enhanced lumbar spine MRI should be performed. ?  ?02/04/2021 Imaging  ? EXAM: ?CT ABDOMEN AND PELVIS WITH CONTRAST ? ?IMPRESSION: ?No new abdominopelvic collections or abscess development in the 1 month interval. ?  ?Left anterior drain remains within a loop of small bowel, unchanged. ?  ?Left lateral abscess drain remains in the retroperitoneal space ?adjacent  to the iliopsoas muscle with a small amount of  residual ?fluid and air but no measurable collection. ?  ?Stable soft tissue prominence and pericolonic strandy ?edema/inflammation about the left descending colon compatible with residual diverticulitis/colitis. Difficul

## 2021-10-20 NOTE — Patient Instructions (Signed)
[place]  Discharge Instructions: ?Thank you for choosing [facility] to provide your oncology and hematology care.  ? ?If you have a lab appointment with the [facility], please go directly to the [facility] and check in at the registration area. ?  ?Wear comfortable clothing and clothing appropriate for easy access to any Portacath or PICC line.  ? ?We strive to give you quality time with your provider. You may need to reschedule your appointment if you arrive late (15 or more minutes).  Arriving late affects you and other patients whose appointments are after yours.  Also, if you miss three or more appointments without notifying the office, you may be dismissed from the clinic at the provider?s discretion.    ?  ?For prescription refill requests, have your pharmacy contact our office and allow 72 hours for refills to be completed.   ? ?Today you received the following chemotherapy and/or immunotherapy agents: Keytruda (pembrolizumab).    ?  ?To help prevent nausea and vomiting after your treatment, we encourage you to take your nausea medication as directed. ? ?BELOW ARE SYMPTOMS THAT SHOULD BE REPORTED IMMEDIATELY: ?*FEVER GREATER THAN 100.4 F (38 ?C) OR HIGHER ?*CHILLS OR SWEATING ?*NAUSEA AND VOMITING THAT IS NOT CONTROLLED WITH YOUR NAUSEA MEDICATION ?*UNUSUAL SHORTNESS OF BREATH ?*UNUSUAL BRUISING OR BLEEDING ?*URINARY PROBLEMS (pain or burning when urinating, or frequent urination) ?*BOWEL PROBLEMS (unusual diarrhea, constipation, pain near the anus) ?TENDERNESS IN MOUTH AND THROAT WITH OR WITHOUT PRESENCE OF ULCERS (sore throat, sores in mouth, or a toothache) ?UNUSUAL RASH, SWELLING OR PAIN  ?UNUSUAL VAGINAL DISCHARGE OR ITCHING  ? ?Items with * indicate a potential emergency and should be followed up as soon as possible or go to the Emergency Department if any problems should occur. ? ?Please show the CHEMOTHERAPY ALERT CARD or IMMUNOTHERAPY ALERT CARD  at check-in to the Emergency Department and triage nurse. ? ?Should you have questions after your visit or need to cancel or reschedule your appointment, please contact [facility]  Dept: [phone]  and follow the prompts.  Office hours are 8:00 a.m. to 4:30 p.m. Monday - Friday. Please note that voicemails left after 4:00 p.m. may not be returned until the following business day.  We are closed weekends and major holidays. You have access to a nurse at all times for urgent questions. Please call the main number to the clinic Dept: [phone] and follow the prompts. ? ? ?For any non-urgent questions, you may also contact your provider using MyChart. We now offer e-Visits for anyone 37 and older to request care online for non-urgent symptoms. For details visit [email or web address]. ?  ?Also download the MyChart app! Go to the app store, search "MyChart", open the app, select [facility], and log in with your MyChart username and password. ? ?Due to Covid, a mask is required upon entering the hospital/clinic. If you do not have a mask, one will be given to you upon arrival. For doctor visits, patients may have 1 support person aged 75 or older with them. For treatment visits, patients cannot have anyone with them due to current Covid guidelines and our immunocompromised population.  ? ?

## 2021-10-21 ENCOUNTER — Telehealth: Payer: Self-pay | Admitting: Hematology

## 2021-10-21 LAB — T4: T4, Total: 5.3 ug/dL (ref 4.5–12.0)

## 2021-10-21 LAB — MAGNESIUM: Magnesium: 1.8 mg/dL (ref 1.6–2.3)

## 2021-10-21 NOTE — Telephone Encounter (Signed)
Scheduled follow-up appointments per 5/16 los. Patient is aware. 

## 2021-10-22 DIAGNOSIS — E876 Hypokalemia: Secondary | ICD-10-CM | POA: Diagnosis not present

## 2021-10-22 DIAGNOSIS — C189 Malignant neoplasm of colon, unspecified: Secondary | ICD-10-CM | POA: Diagnosis not present

## 2021-10-22 DIAGNOSIS — Z791 Long term (current) use of non-steroidal anti-inflammatories (NSAID): Secondary | ICD-10-CM | POA: Diagnosis not present

## 2021-10-22 DIAGNOSIS — Z933 Colostomy status: Secondary | ICD-10-CM | POA: Diagnosis not present

## 2021-10-22 DIAGNOSIS — Z9181 History of falling: Secondary | ICD-10-CM | POA: Diagnosis not present

## 2021-10-22 DIAGNOSIS — Z483 Aftercare following surgery for neoplasm: Secondary | ICD-10-CM | POA: Diagnosis not present

## 2021-10-22 DIAGNOSIS — I10 Essential (primary) hypertension: Secondary | ICD-10-CM | POA: Diagnosis not present

## 2021-10-22 DIAGNOSIS — E669 Obesity, unspecified: Secondary | ICD-10-CM | POA: Diagnosis not present

## 2021-10-22 DIAGNOSIS — Z9049 Acquired absence of other specified parts of digestive tract: Secondary | ICD-10-CM | POA: Diagnosis not present

## 2021-10-22 DIAGNOSIS — K5792 Diverticulitis of intestine, part unspecified, without perforation or abscess without bleeding: Secondary | ICD-10-CM | POA: Diagnosis not present

## 2021-10-22 DIAGNOSIS — G4730 Sleep apnea, unspecified: Secondary | ICD-10-CM | POA: Diagnosis not present

## 2021-10-22 DIAGNOSIS — E440 Moderate protein-calorie malnutrition: Secondary | ICD-10-CM | POA: Diagnosis not present

## 2021-10-22 DIAGNOSIS — Z79891 Long term (current) use of opiate analgesic: Secondary | ICD-10-CM | POA: Diagnosis not present

## 2021-10-22 DIAGNOSIS — Z6837 Body mass index (BMI) 37.0-37.9, adult: Secondary | ICD-10-CM | POA: Diagnosis not present

## 2021-10-23 ENCOUNTER — Encounter: Payer: Self-pay | Admitting: Internal Medicine

## 2021-10-27 ENCOUNTER — Other Ambulatory Visit: Payer: Self-pay

## 2021-10-27 DIAGNOSIS — E876 Hypokalemia: Secondary | ICD-10-CM

## 2021-10-27 MED ORDER — POTASSIUM CHLORIDE 20 MEQ/15ML (10%) PO SOLN: 60.0000 meq | Freq: Every day | ORAL | 0 refills | Status: DC

## 2021-10-29 DIAGNOSIS — Z9049 Acquired absence of other specified parts of digestive tract: Secondary | ICD-10-CM | POA: Diagnosis not present

## 2021-10-29 DIAGNOSIS — I10 Essential (primary) hypertension: Secondary | ICD-10-CM | POA: Diagnosis not present

## 2021-10-29 DIAGNOSIS — C189 Malignant neoplasm of colon, unspecified: Secondary | ICD-10-CM | POA: Diagnosis not present

## 2021-10-29 DIAGNOSIS — Z791 Long term (current) use of non-steroidal anti-inflammatories (NSAID): Secondary | ICD-10-CM | POA: Diagnosis not present

## 2021-10-29 DIAGNOSIS — Z6837 Body mass index (BMI) 37.0-37.9, adult: Secondary | ICD-10-CM | POA: Diagnosis not present

## 2021-10-29 DIAGNOSIS — K5792 Diverticulitis of intestine, part unspecified, without perforation or abscess without bleeding: Secondary | ICD-10-CM | POA: Diagnosis not present

## 2021-10-29 DIAGNOSIS — Z9181 History of falling: Secondary | ICD-10-CM | POA: Diagnosis not present

## 2021-10-29 DIAGNOSIS — E440 Moderate protein-calorie malnutrition: Secondary | ICD-10-CM | POA: Diagnosis not present

## 2021-10-29 DIAGNOSIS — Z483 Aftercare following surgery for neoplasm: Secondary | ICD-10-CM | POA: Diagnosis not present

## 2021-10-29 DIAGNOSIS — Z79891 Long term (current) use of opiate analgesic: Secondary | ICD-10-CM | POA: Diagnosis not present

## 2021-10-29 DIAGNOSIS — E669 Obesity, unspecified: Secondary | ICD-10-CM | POA: Diagnosis not present

## 2021-10-29 DIAGNOSIS — Z933 Colostomy status: Secondary | ICD-10-CM | POA: Diagnosis not present

## 2021-10-29 DIAGNOSIS — E876 Hypokalemia: Secondary | ICD-10-CM | POA: Diagnosis not present

## 2021-10-29 DIAGNOSIS — G4730 Sleep apnea, unspecified: Secondary | ICD-10-CM | POA: Diagnosis not present

## 2021-10-30 ENCOUNTER — Encounter: Payer: Self-pay | Admitting: Internal Medicine

## 2021-11-04 ENCOUNTER — Other Ambulatory Visit: Payer: Self-pay

## 2021-11-04 ENCOUNTER — Other Ambulatory Visit: Payer: Self-pay | Admitting: Hematology

## 2021-11-04 NOTE — Telephone Encounter (Signed)
Pt is taking Liquid Potassium.

## 2021-11-05 DIAGNOSIS — G4730 Sleep apnea, unspecified: Secondary | ICD-10-CM | POA: Diagnosis not present

## 2021-11-05 DIAGNOSIS — K5792 Diverticulitis of intestine, part unspecified, without perforation or abscess without bleeding: Secondary | ICD-10-CM | POA: Diagnosis not present

## 2021-11-05 DIAGNOSIS — E669 Obesity, unspecified: Secondary | ICD-10-CM | POA: Diagnosis not present

## 2021-11-05 DIAGNOSIS — Z483 Aftercare following surgery for neoplasm: Secondary | ICD-10-CM | POA: Diagnosis not present

## 2021-11-05 DIAGNOSIS — E440 Moderate protein-calorie malnutrition: Secondary | ICD-10-CM | POA: Diagnosis not present

## 2021-11-05 DIAGNOSIS — Z791 Long term (current) use of non-steroidal anti-inflammatories (NSAID): Secondary | ICD-10-CM | POA: Diagnosis not present

## 2021-11-05 DIAGNOSIS — Z6837 Body mass index (BMI) 37.0-37.9, adult: Secondary | ICD-10-CM | POA: Diagnosis not present

## 2021-11-05 DIAGNOSIS — Z79891 Long term (current) use of opiate analgesic: Secondary | ICD-10-CM | POA: Diagnosis not present

## 2021-11-05 DIAGNOSIS — I10 Essential (primary) hypertension: Secondary | ICD-10-CM | POA: Diagnosis not present

## 2021-11-05 DIAGNOSIS — E876 Hypokalemia: Secondary | ICD-10-CM | POA: Diagnosis not present

## 2021-11-05 DIAGNOSIS — Z9181 History of falling: Secondary | ICD-10-CM | POA: Diagnosis not present

## 2021-11-05 DIAGNOSIS — C189 Malignant neoplasm of colon, unspecified: Secondary | ICD-10-CM | POA: Diagnosis not present

## 2021-11-05 DIAGNOSIS — Z933 Colostomy status: Secondary | ICD-10-CM | POA: Diagnosis not present

## 2021-11-05 DIAGNOSIS — Z9049 Acquired absence of other specified parts of digestive tract: Secondary | ICD-10-CM | POA: Diagnosis not present

## 2021-11-09 ENCOUNTER — Other Ambulatory Visit: Payer: Self-pay

## 2021-11-09 DIAGNOSIS — Z6837 Body mass index (BMI) 37.0-37.9, adult: Secondary | ICD-10-CM | POA: Diagnosis not present

## 2021-11-09 DIAGNOSIS — E440 Moderate protein-calorie malnutrition: Secondary | ICD-10-CM | POA: Diagnosis not present

## 2021-11-09 DIAGNOSIS — Z9049 Acquired absence of other specified parts of digestive tract: Secondary | ICD-10-CM | POA: Diagnosis not present

## 2021-11-09 DIAGNOSIS — Z79891 Long term (current) use of opiate analgesic: Secondary | ICD-10-CM | POA: Diagnosis not present

## 2021-11-09 DIAGNOSIS — Z9181 History of falling: Secondary | ICD-10-CM | POA: Diagnosis not present

## 2021-11-09 DIAGNOSIS — D649 Anemia, unspecified: Secondary | ICD-10-CM

## 2021-11-09 DIAGNOSIS — C187 Malignant neoplasm of sigmoid colon: Secondary | ICD-10-CM

## 2021-11-09 DIAGNOSIS — E876 Hypokalemia: Secondary | ICD-10-CM | POA: Diagnosis not present

## 2021-11-09 DIAGNOSIS — G4730 Sleep apnea, unspecified: Secondary | ICD-10-CM | POA: Diagnosis not present

## 2021-11-09 DIAGNOSIS — I10 Essential (primary) hypertension: Secondary | ICD-10-CM | POA: Diagnosis not present

## 2021-11-09 DIAGNOSIS — Z791 Long term (current) use of non-steroidal anti-inflammatories (NSAID): Secondary | ICD-10-CM | POA: Diagnosis not present

## 2021-11-09 DIAGNOSIS — Z933 Colostomy status: Secondary | ICD-10-CM | POA: Diagnosis not present

## 2021-11-09 DIAGNOSIS — K5792 Diverticulitis of intestine, part unspecified, without perforation or abscess without bleeding: Secondary | ICD-10-CM | POA: Diagnosis not present

## 2021-11-09 DIAGNOSIS — E669 Obesity, unspecified: Secondary | ICD-10-CM | POA: Diagnosis not present

## 2021-11-09 DIAGNOSIS — C189 Malignant neoplasm of colon, unspecified: Secondary | ICD-10-CM | POA: Diagnosis not present

## 2021-11-09 DIAGNOSIS — Z483 Aftercare following surgery for neoplasm: Secondary | ICD-10-CM | POA: Diagnosis not present

## 2021-11-10 ENCOUNTER — Inpatient Hospital Stay: Payer: BC Managed Care – PPO

## 2021-11-10 ENCOUNTER — Other Ambulatory Visit: Payer: Self-pay

## 2021-11-10 ENCOUNTER — Inpatient Hospital Stay (HOSPITAL_BASED_OUTPATIENT_CLINIC_OR_DEPARTMENT_OTHER): Payer: BC Managed Care – PPO | Admitting: Physician Assistant

## 2021-11-10 ENCOUNTER — Inpatient Hospital Stay: Payer: BC Managed Care – PPO | Attending: Hematology

## 2021-11-10 VITALS — BP 107/66 | HR 87 | Temp 97.5°F | Resp 15 | Wt 182.5 lb

## 2021-11-10 DIAGNOSIS — D259 Leiomyoma of uterus, unspecified: Secondary | ICD-10-CM | POA: Insufficient documentation

## 2021-11-10 DIAGNOSIS — C187 Malignant neoplasm of sigmoid colon: Secondary | ICD-10-CM | POA: Insufficient documentation

## 2021-11-10 DIAGNOSIS — K760 Fatty (change of) liver, not elsewhere classified: Secondary | ICD-10-CM | POA: Diagnosis not present

## 2021-11-10 DIAGNOSIS — Z5111 Encounter for antineoplastic chemotherapy: Secondary | ICD-10-CM | POA: Insufficient documentation

## 2021-11-10 DIAGNOSIS — Z1501 Genetic susceptibility to malignant neoplasm of breast: Secondary | ICD-10-CM | POA: Insufficient documentation

## 2021-11-10 DIAGNOSIS — Z79899 Other long term (current) drug therapy: Secondary | ICD-10-CM | POA: Diagnosis not present

## 2021-11-10 DIAGNOSIS — K8020 Calculus of gallbladder without cholecystitis without obstruction: Secondary | ICD-10-CM | POA: Diagnosis not present

## 2021-11-10 DIAGNOSIS — Z800 Family history of malignant neoplasm of digestive organs: Secondary | ICD-10-CM | POA: Insufficient documentation

## 2021-11-10 DIAGNOSIS — D508 Other iron deficiency anemias: Secondary | ICD-10-CM

## 2021-11-10 DIAGNOSIS — D509 Iron deficiency anemia, unspecified: Secondary | ICD-10-CM | POA: Insufficient documentation

## 2021-11-10 DIAGNOSIS — D649 Anemia, unspecified: Secondary | ICD-10-CM

## 2021-11-10 DIAGNOSIS — K5732 Diverticulitis of large intestine without perforation or abscess without bleeding: Secondary | ICD-10-CM | POA: Diagnosis not present

## 2021-11-10 DIAGNOSIS — L0291 Cutaneous abscess, unspecified: Secondary | ICD-10-CM

## 2021-11-10 DIAGNOSIS — D3502 Benign neoplasm of left adrenal gland: Secondary | ICD-10-CM | POA: Insufficient documentation

## 2021-11-10 DIAGNOSIS — J90 Pleural effusion, not elsewhere classified: Secondary | ICD-10-CM | POA: Insufficient documentation

## 2021-11-10 DIAGNOSIS — I10 Essential (primary) hypertension: Secondary | ICD-10-CM | POA: Insufficient documentation

## 2021-11-10 DIAGNOSIS — L02211 Cutaneous abscess of abdominal wall: Secondary | ICD-10-CM | POA: Diagnosis not present

## 2021-11-10 DIAGNOSIS — Z452 Encounter for adjustment and management of vascular access device: Secondary | ICD-10-CM

## 2021-11-10 DIAGNOSIS — K6819 Other retroperitoneal abscess: Secondary | ICD-10-CM | POA: Insufficient documentation

## 2021-11-10 DIAGNOSIS — F32A Depression, unspecified: Secondary | ICD-10-CM | POA: Insufficient documentation

## 2021-11-10 DIAGNOSIS — M2550 Pain in unspecified joint: Secondary | ICD-10-CM | POA: Insufficient documentation

## 2021-11-10 LAB — CMP (CANCER CENTER ONLY)
ALT: 5 U/L (ref 0–44)
AST: 8 U/L — ABNORMAL LOW (ref 15–41)
Albumin: 3.5 g/dL (ref 3.5–5.0)
Alkaline Phosphatase: 46 U/L (ref 38–126)
Anion gap: 5 (ref 5–15)
BUN: 16 mg/dL (ref 6–20)
CO2: 29 mmol/L (ref 22–32)
Calcium: 9.3 mg/dL (ref 8.9–10.3)
Chloride: 106 mmol/L (ref 98–111)
Creatinine: 0.6 mg/dL (ref 0.44–1.00)
GFR, Estimated: >60 mL/min (ref >60)
Glucose, Bld: 91 mg/dL (ref 70–99)
Potassium: 3.6 mmol/L (ref 3.5–5.1)
Sodium: 140 mmol/L (ref 135–145)
Total Bilirubin: 0.2 mg/dL — ABNORMAL LOW (ref 0.3–1.2)
Total Protein: 7 g/dL (ref 6.5–8.1)

## 2021-11-10 LAB — CBC WITH DIFFERENTIAL (CANCER CENTER ONLY)
Abs Immature Granulocytes: 0.03 10*3/uL (ref 0.00–0.07)
Basophils Absolute: 0 10*3/uL (ref 0.0–0.1)
Basophils Relative: 1 %
Eosinophils Absolute: 0.1 10*3/uL (ref 0.0–0.5)
Eosinophils Relative: 1 %
HCT: 27.2 % — ABNORMAL LOW (ref 36.0–46.0)
Hemoglobin: 8.2 g/dL — ABNORMAL LOW (ref 12.0–15.0)
Immature Granulocytes: 0 %
Lymphocytes Relative: 26 %
Lymphs Abs: 1.9 10*3/uL (ref 0.7–4.0)
MCH: 24.4 pg — ABNORMAL LOW (ref 26.0–34.0)
MCHC: 30.1 g/dL (ref 30.0–36.0)
MCV: 81 fL (ref 80.0–100.0)
Monocytes Absolute: 0.4 10*3/uL (ref 0.1–1.0)
Monocytes Relative: 5 %
Neutro Abs: 4.8 10*3/uL (ref 1.7–7.7)
Neutrophils Relative %: 67 %
Platelet Count: 667 10*3/uL — ABNORMAL HIGH (ref 150–400)
RBC: 3.36 MIL/uL — ABNORMAL LOW (ref 3.87–5.11)
RDW: 15.6 % — ABNORMAL HIGH (ref 11.5–15.5)
WBC Count: 7.2 10*3/uL (ref 4.0–10.5)
nRBC: 0 % (ref 0.0–0.2)

## 2021-11-10 LAB — IRON AND IRON BINDING CAPACITY (CC-WL,HP ONLY)
Iron: 33 ug/dL (ref 28–170)
Saturation Ratios: 10 % — ABNORMAL LOW (ref 10.4–31.8)
TIBC: 326 ug/dL (ref 250–450)
UIBC: 293 ug/dL (ref 148–442)

## 2021-11-10 LAB — PREGNANCY, URINE: Preg Test, Ur: NEGATIVE

## 2021-11-10 LAB — CEA (IN HOUSE-CHCC): CEA (CHCC-In House): 2.27 ng/mL (ref 0.00–5.00)

## 2021-11-10 LAB — TSH: TSH: 1.436 u[IU]/mL (ref 0.350–4.500)

## 2021-11-10 MED ORDER — SODIUM CHLORIDE 0.9% FLUSH
10.0000 mL | INTRAVENOUS | Status: DC | PRN
  Administered 2021-11-10: 10 mL

## 2021-11-10 MED ORDER — HEPARIN SOD (PORK) LOCK FLUSH 100 UNIT/ML IV SOLN
500.0000 [IU] | Freq: Once | INTRAVENOUS | Status: AC | PRN
  Administered 2021-11-10: 500 [IU]

## 2021-11-10 MED ORDER — SODIUM CHLORIDE 0.9% FLUSH
10.0000 mL | Freq: Once | INTRAVENOUS | Status: AC
  Administered 2021-11-10: 10 mL

## 2021-11-10 MED ORDER — SODIUM CHLORIDE 0.9 % IV SOLN: Freq: Once | INTRAVENOUS | Status: AC

## 2021-11-10 MED ORDER — DOXYCYCLINE HYCLATE 100 MG PO TABS: 100.0000 mg | ORAL_TABLET | Freq: Two times a day (BID) | ORAL | 0 refills | Status: AC

## 2021-11-10 MED ORDER — SODIUM CHLORIDE 0.9 % IV SOLN
200.0000 mg | Freq: Once | INTRAVENOUS | Status: AC
  Administered 2021-11-10: 200 mg via INTRAVENOUS
  Filled 2021-11-10: qty 200

## 2021-11-10 NOTE — Progress Notes (Signed)
[place]   [phone] Fax:([phone]   Clinic Follow up Note   Patient Care Team: [name], MD as PCP - General (Internal Medicine) [name], MD as PCP - Cardiology (Cardiology) [name], [name] as Physician Assistant (Emergency Medicine) [name], MD as Consulting Physician (Oncology) [name], RN as Oncology Nurse Navigator (Oncology) [name], MD as Consulting Physician (General Surgery) [name], [name], MD as Consulting Physician (Neurology) [name], [name], MD as Consulting Physician (Obstetrics and Gynecology) [name], MD as Consulting Physician (Gastroenterology) [name], [name], NP as Nurse Practitioner (Nurse Practitioner)  Date of Service:  11/10/2021  CHIEF COMPLAINT: f/u of sigmoid colon cancer  CURRENT THERAPY:  Keytruda, q21d, started 09/08/21  SUMMARY OF ONCOLOGIC HISTORY: Oncology History Overview Note   Cancer Staging  Malignant neoplasm of sigmoid colon [facility]) Staging form: Colon and Rectum, AJCC 8th Edition - Pathologic stage from 04/15/2021: Stage IIC (pT4b, pN0, cM0) - Signed by [name], MD on 06/12/2021    Malignant neoplasm of sigmoid colon [facility])   Initial Diagnosis   Malignant neoplasm of sigmoid colon ([place])   11/04/2020 Imaging   EXAM: CT ABDOMEN AND PELVIS WITHOUT CONTRAST  IMPRESSION: 1. Perforating descending colonic diverticulitis with multiple abdominopelvic gas and fluid collections, measuring up to 5.7 cm and further described above. 2. Cholelithiasis without findings of acute cholecystitis. 3. 3.6 cm benign left adrenal adenoma. 4. Leiomyomatous uterus. 5. 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   11/07/2020 Imaging   EXAM: CT ABDOMEN AND PELVIS WITHOUT CONTRAST  IMPRESSION: Continued wall thickening is seen involving descending  colon suggesting infectious or inflammatory colitis or perforated diverticulitis. There is an adjacent fluid collection measuring 7.0 x 5.0 cm consistent with abscess which is significantly enlarged compared to prior exam. Adjacent to the abscess, there is a severely thickened small bowel loop most consistent with secondary inflammation.   5.2 x 3.5 cm fluid collection is noted within the left psoas muscle consistent with abscess which is significantly enlarged compared to prior exam. 4.4 x 3.8 cm fluid collection consistent with abscess is noted in the left retroperitoneal region which is also enlarged compared to prior exam.   Fibroid uterus.   Cholelithiasis.   11/27/2020 Imaging   EXAM: CT ABDOMEN AND PELVIS WITHOUT CONTRAST  IMPRESSION: 1. Significantly interval decreased size of the previously visualized left retroperitoneal and left anterior mid abdominal fluid collections. There is persistent fat stranding and mural thickening of the mid descending colon at this level. 2. Unchanged leiomyomatous uterus. 3. Unchanged cholelithiasis.   12/11/2020 Imaging   EXAM: CT ABDOMEN AND PELVIS WITHOUT CONTRAST  IMPRESSION: 1. Stable inflammatory changes centered around the distal descending colon in the left lower abdomen. Pericolonic inflammatory changes have minimally changed since 11/27/2020. Small pocket of gas medial to the colon is probably associated with a fistula or small residual abscess collection. 2. Stable position of the two percutaneous drains. The more anterior drain may be extending through a portion of the small bowel. 3. Cholelithiasis. 4. Fibroid uterus. Cannot exclude an ovarian/adnexal cystic structure near the uterine fundus. 5. Left adrenal adenoma.   01/07/2021 Imaging   EXAM: CT ABDOMEN AND PELVIS WITH CONTRAST  IMPRESSION: 1. No new abscesses identified. Similar degree of soft tissue thickening seen in the left pericolonic region. The degree  of persistent soft tissues thickening in the pericolonic space further raises suspicions for malignancy. Further  evaluation with colonoscopy should be performed. 2. Left retroperitoneal abscess drain unchanged in position. Anterior left abdominal drain again seen terminating within small bowel loop. 3. 2.6 cm mildly sclerotic lesion noted in the L2 vertebral body. Further evaluation with contrast enhanced lumbar spine MRI should be performed.   02/04/2021 Imaging   EXAM: CT ABDOMEN AND PELVIS WITH CONTRAST  IMPRESSION: No new abdominopelvic collections or abscess development in the 1 month interval.   Left anterior drain remains within a loop of small bowel, unchanged.   Left lateral abscess drain remains in the retroperitoneal space adjacent to the iliopsoas muscle with a small amount of residual fluid and air but no measurable collection.   Stable soft tissue prominence and pericolonic strandy edema/inflammation about the left descending colon compatible with residual diverticulitis/colitis. Difficult to exclude underlying transmural lesion.   04/01/2021 Imaging   EXAM: CT ABDOMEN AND PELVIS WITH CONTRAST  IMPRESSION: There appears to be significant enhancement and wall thickening involving the descending colon with some degree of traction and involvement of adjacent small bowel loops. This is consistent with the history of diverticulitis and perforation. Stable position of percutaneous drainage catheter is seen adjacent to left psoas muscle with no significant residual fluid remaining. The other percutaneous drainage catheter that was previously noted to be within small bowel loop on prior exam in the left lower quadrant, has significantly retracted and appears to be outside of the peritoneal space at this time.   Since the prior exam, there does appear to be some degree of rotation involving mesenteric vessels and structures in the right lower quadrant, suggesting partial  volvulus or malrotation. There is the interval development of several lymph nodes in this area, most likely inflammatory or reactive in etiology. Mild amount of free fluid is also noted in the pelvis. However, no significant bowel wall thickening or dilatation is seen in this area. These results will be called to the ordering clinician or representative by the Radiologist Assistant, and communication documented in the PACS or zVision Dashboard.   Stable uterine fibroid.   Hepatic steatosis.   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   Cholelithiasis.   04/10/2021 Imaging   EXAM: CT ANGIOGRAPHY CHEST CT ABDOMEN AND PELVIS WITH CONTRAST  IMPRESSION: 1. Again seen are findings compatible with descending colon diverticulitis with perforation. Free air and inflammation have increased in the interval. 2. New lobulated enhancing fluid collection posterior to this segment of inflamed colon measuring 8.5 x 3.5 x 10.0 cm. This collection now invades the adjacent iliopsoas muscle as well as extends through the left lateral abdominal wall. The tip of the drainage catheter is in this collection. Findings are compatible with abscess. 3. Anterior left percutaneous drainage catheter tip has been pulled back and is now within the subcutaneous tissues. 4. Trace free fluid. 5. No pulmonary embolism.  No acute cardiopulmonary process. 6. Cholelithiasis. 7. Fatty infiltration of the liver.   04/15/2021 Definitive Surgery   FINAL MICROSCOPIC DIAGNOSIS:   A. SMALL BOWEL, RESECTION:  - Adenocarcinoma.  - No carcinoma identified in 1 lymph node.   B. PERFORATED LEFT COLON, RESECTION:  - Moderately differentiated colonic adenocarcinoma.  - Tumor extends into pericolonic adipose tissue, and is strongly  suggestive of invasion into small bowel.  See oncology table/comments.  - No carcinoma identified in 4 lymph nodes (0/4).  - Tubular adenoma with high-grade dysplasia, 1.  - Tubular adenomas with low  grade dysplasia, 3.   Comments: The size of the tumor is difficult to  estimate secondary to the disrupted nature of the specimen, as well as the infiltrative nature of the tumor.  Tumor can be identified as definitely invading into the pericolonic adipose tissue (block B4), but given the extreme disruption of the tissue, and the involvement of the small bowel by what appears to be a colonic adenocarcinoma, I favor perforation of the large bowel with direct invasion into the small bowel.  Accordingly, I believe this is best regarded as a pT4b lesion.   ADDENDUM:  Mismatch Repair Protein (IHC)  SUMMARY INTERPRETATION: ABNORMAL  There is loss of the major MMR protein [name]6: This indicates a high probability that a hereditary germline mutation is present and referral to genetic counseling is warranted. It is recommended that the loss of protein expression be correlated with molecular based microsatellite instability testing.   IHC EXPRESSION RESULTS  TEST           RESULT  MLH1:          Preserved nuclear expression  [name]2:          Preserved nuclear expression  [name]6:          LOSS OF NUCLEAR EXPRESSION  PMS2:          Preserved nuclear expression    04/15/2021 Cancer Staging   Staging form: Colon and Rectum, AJCC 8th Edition - Pathologic stage from 04/15/2021: Stage IIC (pT4b, pN0, cM0) - Signed by [name], MD on 06/12/2021 Stage prefix: Initial diagnosis Total positive nodes: 0 Histologic grading system: 4 grade system Histologic grade (G): G2 Residual tumor (R): R0 - None    04/29/2021 Imaging   EXAM: CT ABDOMEN AND PELVIS WITH CONTRAST  IMPRESSION: Continued left retroperitoneal abscesses inferior to the left kidney, involving the left psoas muscle and left abdominal wall musculature. Overall size is decreased since prior study. Interval removal of left lower quadrant abscess drainage catheter.   New fluid collection in the cul-de-sac and wrapping around the uterus concerning for  abscess.   Cholelithiasis.   Small left pleural effusion.  Bibasilar atelectasis.   Stable left adrenal adenoma.  ADDENDUM: After discussing the case with Dr. Dwaine Gale in interventional radiology, it was noted that the fluid in the pelvis originally thought to be in the cul-de-sac is likely within the vagina, best seen on sagittal imaging. Recommend speculum exam.   Also, in the left lateral wall in the area of prior abscess in the lateral wall abdominal musculature, some of the abdomen soft tissue appears to be enhancing. While this could be infectious, cannot exclude tumor seeding in the left lateral abdominal wall, measuring 6.9 x 3.8 cm on image 64 of series 2.   06/16/2021 Imaging   EXAM: CT ABDOMEN AND PELVIS WITH CONTRAST  IMPRESSION: Increased size of bulky soft tissue masses involving the left psoas muscle and left lateral abdominal wall soft tissues, consistent with progressive metastatic disease.   Significant progression of multiple peritoneal masses throughout the abdomen and pelvis, consistent with peritoneal carcinomatosis.   New mild retroperitoneal lymphadenopathy, consistent with metastatic disease.   Stable uterine fibroid and left adrenal adenoma.   Cholelithiasis, without evidence of cholecystitis.   06/22/2021 - 08/19/2021 Chemotherapy   Patient is on Treatment Plan : COLORECTAL FOLFOX q14d x 6 months      06/22/2021 Tumor Marker   Patient's tumor was tested for the following markers: CEA. Results of the tumor marker test revealed 87.41.   07/10/2021 Pathology Results   FINAL MICROSCOPIC DIAGNOSIS:   A. PERITONEAL MASS,  LEFT UPPER ABDOMINAL QUADRANT, BIOPSY:  -  Metastatic adenocarcinoma with necrosis, histologically consistent with colon primary.     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 The report date is 07/16/2021. The remainder of testing was negative/normal.  The  CancerNext-Expanded + RNAinsight gene panel offered by [employer] and includes sequencing and rearrangement analysis for the following 77 genes: IP, ALK, APC*, ATM*, AXIN2, BAP1, BARD1, BLM, BMPR1A, BRCA1*, BRCA2*, BRIP1*, CDC73, CDH1*,CDK4, CDKN1B, CDKN2A, CHEK2*, CTNNA1, DICER1, FANCC, FH, FLCN, GALNT12, KIF1B, LZTR1, MAX, MEN1, MET, MLH1*, [name]2*, [name]3, [name]6*, MUTYH*, NBN, NF1*, NF2, NTHL1, PALB2*, PHOX2B, PMS2*, POT1, PRKAR1A, PTCH1, PTEN*, RAD51C*, RAD51D*,RB1, RECQL, RET, SDHA, SDHAF2, SDHB, SDHC, SDHD, SMAD4, SMARCA4, SMARCB1, SMARCE1, STK11, SUFU, TMEM127, TP53*,TSC1, TSC2, VHL and XRCC2 (sequencing and deletion/duplication); EGFR, EGLN1, HOXB13, KIT, MITF, PDGFRA, POLD1 and POLE (sequencing only); EPCAM and GREM1 (deletion/duplication only).   08/28/2021 Imaging   EXAM: CT CHEST, ABDOMEN, AND PELVIS WITH CONTRAST  IMPRESSION: 1. Continued interval progression of multiple metastatic soft tissue nodules/masses in the abdomen and pelvis. 2. No evidence for metastatic disease in the chest. 3. Similar appearance of the subtle lesion in the L2 vertebral body, suspicious for metastatic involvement. 4. Cholelithiasis.   09/08/2021 -  Chemotherapy   Patient is on Treatment Plan : COLORECTAL Pembrolizumab (200) q21d         INTERVAL HISTORY:  Mikayla Rios is here for a follow up of colon cancer. She was last seen by Dr. Burr Medico on 10/20/2021. She is unaccompanied for this visit.   Ms. Schnackenberg reports stable energy levels and she continues to undergo physical therapy.  She feels more active and is starting to incorporate more chores around the house.  She has a good appetite and denies any weight loss.  She denies nausea or vomiting.  She reports left flank pain that has reoccurred in the last 3 weeks.  She takes Dilaudid 1-2 times per day with improvement of pain. She has good output from her ostomy without any watery stools. She denies easy bruising or signs of active bleeding. She reports that  over the weekend, she noticed a boil at her left flank. She applied warm compresses and boil started to drain purulent discharge. She denies fevers, chills, night sweats, shortness of breath, chest pain, cough, neuropathy or edema. She has no other complaints.    All other systems were reviewed with the patient and are negative.  MEDICAL HISTORY:  Past Medical History:  Diagnosis Date   Allergy    seasonal   Anemia    Blood infection 1985   Blood transfusion without reported diagnosis    Diverticulitis    Family history of breast cancer    Family history of colon cancer    Family history of stomach cancer    Hypertension    Obesity    Sleep apnea     SURGICAL HISTORY: Past Surgical History:  Procedure Laterality Date   COLECTOMY WITH COLOSTOMY CREATION/HARTMANN PROCEDURE N/A 04/15/2021   Procedure: COLOSTOMY CREATION/HARTMANN PROCEDURE;  Surgeon: [name], MD;  Location: [facility];  Service: General;  Laterality: N/A;   IR CATHETER TUBE CHANGE  12/12/2020   IR CATHETER TUBE CHANGE  01/27/2021   IR CATHETER TUBE CHANGE  02/19/2021   IR CATHETER TUBE CHANGE  04/13/2021   IR IMAGING GUIDED PORT INSERTION  10/02/2021   IR RADIOLOGIST EVAL & MGMT  11/27/2020   IR RADIOLOGIST EVAL & MGMT  12/11/2020  IR RADIOLOGIST EVAL & MGMT  01/07/2021   IR RADIOLOGIST EVAL & MGMT  02/04/2021   IR SINUS/FIST TUBE CHK-NON GI  12/12/2020   IR SINUS/FIST TUBE CHK-NON GI  02/19/2021   LAPAROSCOPIC PARTIAL COLECTOMY N/A 04/15/2021   Procedure: LAPAROSCOPIC ASSISTED HARTMANN RESECTION;  Surgeon: [name], MD;  Location: [facility];  Service: General;  Laterality: N/A;    I have reviewed the social history and family history with the patient and they are unchanged from previous note.  ALLERGIES:  is allergic to chlorhexidine gluconate, shellfish allergy, and penicillins.  MEDICATIONS:  Current Outpatient Medications  Medication Sig Dispense Refill   amLODipine (NORVASC) 10 MG tablet Take 10 mg  by mouth every evening.     HYDROmorphone (DILAUDID) 2 MG tablet Take by mouth every 4 (four) hours as needed for severe pain.     metoprolol tartrate 75 MG TABS Take 75 mg by mouth 2 (two) times daily. 90 tablet 3   potassium chloride 20 MEQ/15ML (10%) SOLN Take 45 mLs (60 mEq total) by mouth daily. 473 mL 0   POTASSIUM PO Take by mouth.     No current facility-administered medications for this visit.   Facility-Administered Medications Ordered in Other Visits  Medication Dose Route Frequency Provider Last Rate Last Admin   fentaNYL (SUBLIMAZE) injection   Intravenous PRN Mir, Paula Libra, MD   50 mcg at 10/02/21 1436   midazolam (VERSED) injection   Intravenous PRN Mir, Paula Libra, MD   1 mg at 10/02/21 1436    PHYSICAL EXAMINATION: ECOG PERFORMANCE STATUS: 2 - Symptomatic, <50% confined to bed  Vitals:   11/10/21 1322  BP: 107/66  Pulse: 87  Resp: 15  Temp: (!) 97.5 F (36.4 C)  SpO2: 100%   Wt Readings from Last 3 Encounters:  11/10/21 182 lb 8 oz (82.8 kg)  10/20/21 176 lb 1.6 oz (79.9 kg)  10/19/21 165 lb (74.8 kg)     GENERAL:alert, no distress and comfortable SKIN: skin color normal, no rashes or significant lesions EYES: normal, Conjunctiva are pink and non-injected, sclera clear  NEURO: alert & oriented x 3 with fluent speech ABDOMEN: open, draining wound in the left flank approximately 3 cm in diameter. Evidence of purulent discharge but no surrounding erythema.   LABORATORY DATA:  I have reviewed the data as listed    Latest Ref Rng & Units 11/10/2021   12:52 PM 10/20/2021   11:00 AM 10/10/2021    4:20 AM  CBC  WBC 4.0 - 10.5 K/uL 7.2   8.3   6.0    Hemoglobin 12.0 - 15.0 g/dL 8.2   8.0   9.3    Hematocrit 36.0 - 46.0 % 27.2   25.5   30.4    Platelets 150 - 400 K/uL 667   525   515          Latest Ref Rng & Units 10/20/2021   11:00 AM 10/11/2021    3:49 AM 10/10/2021    4:20 AM  CMP  Glucose 70 - 99 mg/dL 98   94   101    BUN 6 - 20 mg/dL 13   10   6      Creatinine 0.44 - 1.00 mg/dL 0.55   0.47   0.40    Sodium 135 - 145 mmol/L 143   137   138    Potassium 3.5 - 5.1 mmol/L 3.0   4.3   3.6    Chloride 98 - 111 mmol/L  111   105   106    CO2 22 - 32 mmol/L 27   26   25     Calcium 8.9 - 10.3 mg/dL 8.2   9.1   8.8    Total Protein 6.5 - 8.1 g/dL 6.4    6.5    Total Bilirubin 0.3 - 1.2 mg/dL 0.2    0.5    Alkaline Phos 38 - 126 U/L 42    50    AST 15 - 41 U/L 8    12    ALT 0 - 44 U/L 6    8      RADIOGRAPHIC STUDIES: I have personally reviewed the radiological images as listed and agreed with the findings in the report. No results found.    ASSESSMENT & PLAN:  Mikayla Rios is a 36 y.o. female with   1. 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 She continued to have abdominal pain and drainage, undergoing drain exchange three times. -she developed worsening weakness and was admitted on 04/10/21. She was taken for emergent small bowel and left colon resection on 04/15/21 under Dr. Johney Maine. 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 Margins negative for invasive carcinoma, but low-grade dysplasia involves proximal margin. All 5 lymph nodes negative (0/5). MSI low. -repeat CT AP on 06/16/21 showed progressive metastatic disease involving soft tissue masses in left psoas muscle and left lateral abdominal wall and peritoneal masses, and new mild retroperitoneal lymphadenopathy.  -baseline CEA on 06/22/21 was elevated at 87.41. -she started FOLFOX on 06/22/21 but did not respond -she switched to [facility] on 09/08/21.  -she was admitted on 10/06/21 for chills, fatigue, decreased appetite, and mild nausea to rule out sepsis. Cultures  were negative. She also received 1u pRBC. -Plan to repeat CT scan in August   2. Anxiety and depression -Due to the prolonged illness and multiple hospital stay since May 2022, and cancer diagnosis, she has been feeling depressed lately. -cymbalta prescribed 05/25/21 -She had panic attack 2/13 after chemo, when she was reacting to clorhexidine.  -F/up Dr. Michail Sermon   3. Genetics, FAP -FO showed MSI-High disease, and showed multiple other mutations including APC, [name]6, and BRCA2 mutations.  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  -I indicated her children should seek testing, when appropriate. Her 9 yo son is eligible now, and possibly her 31 yo daughter who is mature. 53 yo child may be too young    4. Social Support -she is connected with Education officer, museum -she is working to apply for disability. Her job ended just prior to the start of her symptoms. -she has three children-- ages 83, 27, and 39.   42. 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 -hgb 8.2 today, iron deficiency with saturation 10% from today. Ferritin 21 on 10/20/2021 -patient reports struggling with some constipation with taking iron pills in the past. Will give IV iron to help bolster iron levels.   6. Abscess of left flank: -Open and draining with purulent discharge.  -No evidence of leukocytosis and patient  is afebrile -Sent doxycycline x 7 days -Will reach out to surgery to determine if additional interventions are needed.  -advised patient to monitor wound closely and follow up if purulent discharge increases, evidence of erythema or worsening pain.   PLAN: -labs today were reviewed and adequate for treatment today. WBC 7.2, Hgb 8.2, Plt 667. CEA is back to normal at 2.27.   -iron panel shows  deficiency. Recommend IV venofer 200 mg once a week x 5 doses -proceed with Cycle 4 of Keytruda today  -doxycycline x 7 days of draining abscess at L flank -RTC on 12/01/2021 for labs, f/u visit with Dr. Burr Medico and Cycle 5 of Keytruda.    All questions were answered. The patient knows to call the clinic with any problems, questions or concerns. No barriers to learning was detected.  I have spent a total of 30 minutes minutes of face-to-face and non-face-to-face time, preparing to see the patient, performing a medically appropriate examination, counseling and educating the patient, ordering medications/tests, communicating with other health care professionals, documenting clinical information in the electronic health record, and care coordination.   Dede Query PA-C Dept of Hematology and [place] at [facility] Phone: [phone]

## 2021-11-10 NOTE — Patient Instructions (Signed)
[facility]  Discharge Instructions: ?Thank you for choosing [facility] to provide your oncology and hematology care.  ? ?If you have a lab appointment with the Cancer Center, please go directly to the Cancer Center and check in at the registration area. ?  ?Wear comfortable clothing and clothing appropriate for easy access to any Portacath or PICC line.  ? ?We strive to give you quality time with your provider. You may need to reschedule your appointment if you arrive late (15 or more minutes).  Arriving late affects you and other patients whose appointments are after yours.  Also, if you miss three or more appointments without notifying the office, you may be dismissed from the clinic at the provider?s discretion.    ?  ?For prescription refill requests, have your pharmacy contact our office and allow 72 hours for refills to be completed.   ? ?Today you received the following chemotherapy and/or immunotherapy agents: Keytruda ?  ?To help prevent nausea and vomiting after your treatment, we encourage you to take your nausea medication as directed. ? ?BELOW ARE SYMPTOMS THAT SHOULD BE REPORTED IMMEDIATELY: ?*FEVER GREATER THAN 100.4 F (38 ?C) OR HIGHER ?*CHILLS OR SWEATING ?*NAUSEA AND VOMITING THAT IS NOT CONTROLLED WITH YOUR NAUSEA MEDICATION ?*UNUSUAL SHORTNESS OF BREATH ?*UNUSUAL BRUISING OR BLEEDING ?*URINARY PROBLEMS (pain or burning when urinating, or frequent urination) ?*BOWEL PROBLEMS (unusual diarrhea, constipation, pain near the anus) ?TENDERNESS IN MOUTH AND THROAT WITH OR WITHOUT PRESENCE OF ULCERS (sore throat, sores in mouth, or a toothache) ?UNUSUAL RASH, SWELLING OR PAIN  ?UNUSUAL VAGINAL DISCHARGE OR ITCHING  ? ?Items with * indicate a potential emergency and should be followed up as soon as possible or go to the Emergency Department if any problems should occur. ? ?Please show the CHEMOTHERAPY ALERT CARD or IMMUNOTHERAPY ALERT CARD at check-in to the  Emergency Department and triage nurse. ? ?Should you have questions after your visit or need to cancel or reschedule your appointment, please contact [facility]  Dept: [phone]  and follow the prompts.  Office hours are 8:00 a.m. to 4:30 p.m. Monday - Friday. Please note that voicemails left after 4:00 p.m. may not be returned until the following business day.  We are closed weekends and major holidays. You have access to a nurse at all times for urgent questions. Please call the main number to the clinic Dept: [phone] and follow the prompts. ? ? ?For any non-urgent questions, you may also contact your provider using MyChart. We now offer e-Visits for anyone 18 and older to request care online for non-urgent symptoms. For details visit [email or web address]. ?  ?Also download the MyChart app! Go to the app store, search "MyChart", open the app, select [facility], and log in with your MyChart username and password. ? ?Due to Covid, a mask is required upon entering the hospital/clinic. If you do not have a mask, one will be given to you upon arrival. For doctor visits, patients may have 1 support person aged 18 or older with them. For treatment visits, patients cannot have anyone with them due to current Covid guidelines and our immunocompromised population.  ? ?

## 2021-11-11 ENCOUNTER — Telehealth: Payer: Self-pay | Admitting: Physician Assistant

## 2021-11-11 NOTE — Telephone Encounter (Signed)
Scheduled per 6/6 los, pt has confirmed appts

## 2021-11-12 LAB — T4: T4, Total: 6.4 ug/dL (ref 4.5–12.0)

## 2021-11-13 DIAGNOSIS — Z483 Aftercare following surgery for neoplasm: Secondary | ICD-10-CM

## 2021-11-13 DIAGNOSIS — D630 Anemia in neoplastic disease: Secondary | ICD-10-CM | POA: Diagnosis not present

## 2021-11-13 DIAGNOSIS — D62 Acute posthemorrhagic anemia: Secondary | ICD-10-CM

## 2021-11-13 DIAGNOSIS — R5381 Other malaise: Secondary | ICD-10-CM

## 2021-11-13 DIAGNOSIS — C189 Malignant neoplasm of colon, unspecified: Secondary | ICD-10-CM | POA: Diagnosis not present

## 2021-11-13 DIAGNOSIS — D649 Anemia, unspecified: Secondary | ICD-10-CM

## 2021-11-13 DIAGNOSIS — Z79891 Long term (current) use of opiate analgesic: Secondary | ICD-10-CM

## 2021-11-13 DIAGNOSIS — Z5181 Encounter for therapeutic drug level monitoring: Secondary | ICD-10-CM

## 2021-11-13 DIAGNOSIS — G4730 Sleep apnea, unspecified: Secondary | ICD-10-CM

## 2021-11-13 DIAGNOSIS — E440 Moderate protein-calorie malnutrition: Secondary | ICD-10-CM

## 2021-11-13 DIAGNOSIS — Z933 Colostomy status: Secondary | ICD-10-CM

## 2021-11-13 DIAGNOSIS — K5792 Diverticulitis of intestine, part unspecified, without perforation or abscess without bleeding: Secondary | ICD-10-CM

## 2021-11-13 DIAGNOSIS — I10 Essential (primary) hypertension: Secondary | ICD-10-CM | POA: Diagnosis not present

## 2021-11-16 DIAGNOSIS — Z933 Colostomy status: Secondary | ICD-10-CM | POA: Diagnosis not present

## 2021-11-16 DIAGNOSIS — F32A Depression, unspecified: Secondary | ICD-10-CM | POA: Diagnosis not present

## 2021-11-16 DIAGNOSIS — D630 Anemia in neoplastic disease: Secondary | ICD-10-CM | POA: Diagnosis not present

## 2021-11-16 DIAGNOSIS — E876 Hypokalemia: Secondary | ICD-10-CM | POA: Diagnosis not present

## 2021-11-16 DIAGNOSIS — E669 Obesity, unspecified: Secondary | ICD-10-CM | POA: Diagnosis not present

## 2021-11-16 DIAGNOSIS — Z9181 History of falling: Secondary | ICD-10-CM | POA: Diagnosis not present

## 2021-11-16 DIAGNOSIS — K5792 Diverticulitis of intestine, part unspecified, without perforation or abscess without bleeding: Secondary | ICD-10-CM | POA: Diagnosis not present

## 2021-11-16 DIAGNOSIS — Z483 Aftercare following surgery for neoplasm: Secondary | ICD-10-CM | POA: Diagnosis not present

## 2021-11-16 DIAGNOSIS — I10 Essential (primary) hypertension: Secondary | ICD-10-CM | POA: Diagnosis not present

## 2021-11-16 DIAGNOSIS — Z6831 Body mass index (BMI) 31.0-31.9, adult: Secondary | ICD-10-CM | POA: Diagnosis not present

## 2021-11-16 DIAGNOSIS — F419 Anxiety disorder, unspecified: Secondary | ICD-10-CM | POA: Diagnosis not present

## 2021-11-16 DIAGNOSIS — E440 Moderate protein-calorie malnutrition: Secondary | ICD-10-CM | POA: Diagnosis not present

## 2021-11-16 DIAGNOSIS — C189 Malignant neoplasm of colon, unspecified: Secondary | ICD-10-CM | POA: Diagnosis not present

## 2021-11-16 DIAGNOSIS — G4730 Sleep apnea, unspecified: Secondary | ICD-10-CM | POA: Diagnosis not present

## 2021-11-16 DIAGNOSIS — Z9049 Acquired absence of other specified parts of digestive tract: Secondary | ICD-10-CM | POA: Diagnosis not present

## 2021-11-16 DIAGNOSIS — Z79891 Long term (current) use of opiate analgesic: Secondary | ICD-10-CM | POA: Diagnosis not present

## 2021-11-17 ENCOUNTER — Other Ambulatory Visit: Payer: Self-pay

## 2021-11-17 ENCOUNTER — Inpatient Hospital Stay: Payer: BC Managed Care – PPO

## 2021-11-17 VITALS — BP 120/75 | HR 80 | Temp 98.5°F | Resp 17 | Wt 183.2 lb

## 2021-11-17 DIAGNOSIS — K5732 Diverticulitis of large intestine without perforation or abscess without bleeding: Secondary | ICD-10-CM | POA: Diagnosis not present

## 2021-11-17 DIAGNOSIS — F32A Depression, unspecified: Secondary | ICD-10-CM | POA: Diagnosis not present

## 2021-11-17 DIAGNOSIS — D649 Anemia, unspecified: Secondary | ICD-10-CM | POA: Diagnosis not present

## 2021-11-17 DIAGNOSIS — Z452 Encounter for adjustment and management of vascular access device: Secondary | ICD-10-CM

## 2021-11-17 DIAGNOSIS — D509 Iron deficiency anemia, unspecified: Secondary | ICD-10-CM | POA: Diagnosis not present

## 2021-11-17 DIAGNOSIS — D3502 Benign neoplasm of left adrenal gland: Secondary | ICD-10-CM | POA: Diagnosis not present

## 2021-11-17 DIAGNOSIS — J90 Pleural effusion, not elsewhere classified: Secondary | ICD-10-CM | POA: Diagnosis not present

## 2021-11-17 DIAGNOSIS — D259 Leiomyoma of uterus, unspecified: Secondary | ICD-10-CM | POA: Diagnosis not present

## 2021-11-17 DIAGNOSIS — I10 Essential (primary) hypertension: Secondary | ICD-10-CM | POA: Diagnosis not present

## 2021-11-17 DIAGNOSIS — K760 Fatty (change of) liver, not elsewhere classified: Secondary | ICD-10-CM | POA: Diagnosis not present

## 2021-11-17 DIAGNOSIS — Z1501 Genetic susceptibility to malignant neoplasm of breast: Secondary | ICD-10-CM | POA: Diagnosis not present

## 2021-11-17 DIAGNOSIS — K6819 Other retroperitoneal abscess: Secondary | ICD-10-CM | POA: Diagnosis not present

## 2021-11-17 DIAGNOSIS — M2550 Pain in unspecified joint: Secondary | ICD-10-CM | POA: Diagnosis not present

## 2021-11-17 DIAGNOSIS — C187 Malignant neoplasm of sigmoid colon: Secondary | ICD-10-CM | POA: Diagnosis not present

## 2021-11-17 DIAGNOSIS — Z515 Encounter for palliative care: Secondary | ICD-10-CM

## 2021-11-17 DIAGNOSIS — L02211 Cutaneous abscess of abdominal wall: Secondary | ICD-10-CM | POA: Diagnosis not present

## 2021-11-17 DIAGNOSIS — Z5111 Encounter for antineoplastic chemotherapy: Secondary | ICD-10-CM | POA: Diagnosis not present

## 2021-11-17 DIAGNOSIS — K8020 Calculus of gallbladder without cholecystitis without obstruction: Secondary | ICD-10-CM | POA: Diagnosis not present

## 2021-11-17 MED ORDER — HEPARIN SOD (PORK) LOCK FLUSH 100 UNIT/ML IV SOLN
500.0000 [IU] | Freq: Once | INTRAVENOUS | Status: AC | PRN
  Administered 2021-11-17: 500 [IU]

## 2021-11-17 MED ORDER — SODIUM CHLORIDE 0.9 % IV SOLN
200.0000 mg | Freq: Once | INTRAVENOUS | Status: AC
  Administered 2021-11-17: 200 mg via INTRAVENOUS
  Filled 2021-11-17: qty 200

## 2021-11-17 MED ORDER — SODIUM CHLORIDE 0.9 % IV SOLN: Freq: Once | INTRAVENOUS | Status: AC

## 2021-11-17 MED ORDER — SODIUM CHLORIDE 0.9% FLUSH
10.0000 mL | Freq: Once | INTRAVENOUS | Status: AC | PRN
  Administered 2021-11-17: 10 mL

## 2021-11-17 NOTE — Patient Instructions (Signed)

## 2021-11-17 NOTE — Progress Notes (Signed)
Patient stayed for her 30 minute post-iron observation period. Tolerated well, VSS, taken by W/C to lobby.

## 2021-11-19 ENCOUNTER — Encounter: Payer: Self-pay | Admitting: Hematology

## 2021-11-20 ENCOUNTER — Encounter: Payer: Self-pay | Admitting: Licensed Clinical Social Worker

## 2021-11-20 ENCOUNTER — Other Ambulatory Visit: Payer: Self-pay

## 2021-11-20 DIAGNOSIS — C187 Malignant neoplasm of sigmoid colon: Secondary | ICD-10-CM

## 2021-11-20 NOTE — Progress Notes (Signed)
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  Letter composed and given to RN for Dr. Ernestina Penna signature.  To be given to pt once signed.  CSW to remain available to assist pt as appropriate throughout duration of treatment.      Mikayla Combs, LCSW

## 2021-11-23 ENCOUNTER — Other Ambulatory Visit (HOSPITAL_COMMUNITY): Payer: Self-pay

## 2021-11-23 ENCOUNTER — Other Ambulatory Visit: Payer: Self-pay | Admitting: Nurse Practitioner

## 2021-11-23 ENCOUNTER — Other Ambulatory Visit: Payer: Self-pay | Admitting: Internal Medicine

## 2021-11-23 MED ORDER — HYDROMORPHONE HCL 2 MG PO TABS: 2.0000 mg | ORAL_TABLET | Freq: Four times a day (QID) | ORAL | 0 refills | Status: DC | PRN

## 2021-11-24 ENCOUNTER — Inpatient Hospital Stay: Payer: BC Managed Care – PPO

## 2021-11-24 ENCOUNTER — Other Ambulatory Visit: Payer: Self-pay

## 2021-11-24 VITALS — BP 96/65 | HR 86 | Temp 98.7°F | Resp 17

## 2021-11-24 DIAGNOSIS — Z515 Encounter for palliative care: Secondary | ICD-10-CM

## 2021-11-24 DIAGNOSIS — Z5111 Encounter for antineoplastic chemotherapy: Secondary | ICD-10-CM | POA: Diagnosis not present

## 2021-11-24 DIAGNOSIS — D509 Iron deficiency anemia, unspecified: Secondary | ICD-10-CM | POA: Diagnosis not present

## 2021-11-24 DIAGNOSIS — Z452 Encounter for adjustment and management of vascular access device: Secondary | ICD-10-CM

## 2021-11-24 DIAGNOSIS — K5732 Diverticulitis of large intestine without perforation or abscess without bleeding: Secondary | ICD-10-CM | POA: Diagnosis not present

## 2021-11-24 DIAGNOSIS — D3502 Benign neoplasm of left adrenal gland: Secondary | ICD-10-CM | POA: Diagnosis not present

## 2021-11-24 DIAGNOSIS — F32A Depression, unspecified: Secondary | ICD-10-CM | POA: Diagnosis not present

## 2021-11-24 DIAGNOSIS — J90 Pleural effusion, not elsewhere classified: Secondary | ICD-10-CM | POA: Diagnosis not present

## 2021-11-24 DIAGNOSIS — Z933 Colostomy status: Secondary | ICD-10-CM | POA: Diagnosis not present

## 2021-11-24 DIAGNOSIS — Z4801 Encounter for change or removal of surgical wound dressing: Secondary | ICD-10-CM | POA: Diagnosis not present

## 2021-11-24 DIAGNOSIS — K6819 Other retroperitoneal abscess: Secondary | ICD-10-CM | POA: Diagnosis not present

## 2021-11-24 DIAGNOSIS — I10 Essential (primary) hypertension: Secondary | ICD-10-CM | POA: Diagnosis not present

## 2021-11-24 DIAGNOSIS — K760 Fatty (change of) liver, not elsewhere classified: Secondary | ICD-10-CM | POA: Diagnosis not present

## 2021-11-24 DIAGNOSIS — D649 Anemia, unspecified: Secondary | ICD-10-CM | POA: Diagnosis not present

## 2021-11-24 DIAGNOSIS — S31109A Unspecified open wound of abdominal wall, unspecified quadrant without penetration into peritoneal cavity, initial encounter: Secondary | ICD-10-CM | POA: Diagnosis not present

## 2021-11-24 DIAGNOSIS — Z85038 Personal history of other malignant neoplasm of large intestine: Secondary | ICD-10-CM | POA: Diagnosis not present

## 2021-11-24 DIAGNOSIS — M2550 Pain in unspecified joint: Secondary | ICD-10-CM | POA: Diagnosis not present

## 2021-11-24 DIAGNOSIS — C187 Malignant neoplasm of sigmoid colon: Secondary | ICD-10-CM | POA: Diagnosis not present

## 2021-11-24 DIAGNOSIS — L02211 Cutaneous abscess of abdominal wall: Secondary | ICD-10-CM | POA: Diagnosis not present

## 2021-11-24 DIAGNOSIS — Z1501 Genetic susceptibility to malignant neoplasm of breast: Secondary | ICD-10-CM | POA: Diagnosis not present

## 2021-11-24 DIAGNOSIS — K8020 Calculus of gallbladder without cholecystitis without obstruction: Secondary | ICD-10-CM | POA: Diagnosis not present

## 2021-11-24 DIAGNOSIS — D259 Leiomyoma of uterus, unspecified: Secondary | ICD-10-CM | POA: Diagnosis not present

## 2021-11-24 MED ORDER — SODIUM CHLORIDE 0.9 % IV SOLN
200.0000 mg | Freq: Once | INTRAVENOUS | Status: AC
  Administered 2021-11-24: 200 mg via INTRAVENOUS
  Filled 2021-11-24: qty 200

## 2021-11-24 MED ORDER — SODIUM CHLORIDE 0.9 % IV SOLN: Freq: Once | INTRAVENOUS | Status: AC

## 2021-11-24 NOTE — Patient Instructions (Signed)

## 2021-11-24 NOTE — Progress Notes (Signed)
Patient tolerated IV iron infusion well. Declined to stay for 30 minute post observation period. VSS, ambulatory to lobby.  

## 2021-11-25 ENCOUNTER — Encounter: Payer: BC Managed Care – PPO | Attending: Registered Nurse | Admitting: Physical Medicine and Rehabilitation

## 2021-11-25 ENCOUNTER — Encounter: Payer: Self-pay | Admitting: Physical Medicine and Rehabilitation

## 2021-11-25 VITALS — BP 125/88 | HR 80 | Ht 63.0 in | Wt 192.8 lb

## 2021-11-25 DIAGNOSIS — Z515 Encounter for palliative care: Secondary | ICD-10-CM | POA: Insufficient documentation

## 2021-11-25 DIAGNOSIS — R5381 Other malaise: Secondary | ICD-10-CM | POA: Insufficient documentation

## 2021-11-25 DIAGNOSIS — G893 Neoplasm related pain (acute) (chronic): Secondary | ICD-10-CM | POA: Diagnosis not present

## 2021-11-25 DIAGNOSIS — C187 Malignant neoplasm of sigmoid colon: Secondary | ICD-10-CM | POA: Insufficient documentation

## 2021-11-25 MED ORDER — DULOXETINE HCL 60 MG PO CPEP: 60.0000 mg | ORAL_CAPSULE | Freq: Every day | ORAL | 5 refills | Status: DC

## 2021-11-25 NOTE — Progress Notes (Signed)
Subjective:    Patient ID: Mikayla Rios, female    DOB: November 20, 1985, 36 y.o.   MRN: [id number]  HPI Pt is a 36 yr old female with hx of colon adenocarcinoma s/p L colectomy/colostomy- 04/15/21-  Also had Small bowel resection and L flank abscess.  Also had anasarca due to protein malnutrition and sinus tachycardia.  Here for hospital f/u on debility- - discharged in 12/22.   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 Depends on what she eats.   Still trying to test out foods.   Still getting PT- not OT anymore.  Getting H/H- 1x/week-  Walking without RW sometimes- but in house, if lightheaded, dizzy, will use it.  Usually rides w/c for appointments- holds onto husband to get to appointment since yesterday.   Doesn't feel like back to baseline-L side is weaker.  Esp going upstairs- has to lead with R leg.    Was hospitalized in May 2023- they upped Metoprolol and HR and BP doing much better.   125/88 and HR 80 today.   Pain wise- taking Dilaudid- getting via Palliative care/Oncology.  Using topical rubs, heat, ice-  Pain is aching- like has flu- all joints hurting and everything sore  Kidney function is good.  Tried gabapentin- stopped it for some reason- maybe interacts with chemo.  Wasn't helping.  Tylenol arthritis doesn't help, nor Morphine.  Dilaudid- just started- nothing makes it go away, but takes edge off for 1-2 hours.  Has tried Cymbalta for nerve pain/mood- but was so foggy that doesn't know if had side effects   Has skin boil over tumor on L side- been followed by Oncology.   Pain Inventory Average Pain 5 Pain Right Now 5 My pain is sharp and aching  In the last 24 hours, has pain  interfered with the following? General activity 6 Relation with others 6 Enjoyment of life 7 What TIME of day is your pain at its worst? evening and night Sleep (in general) Poor  Pain is worse with: walking, sitting, and inactivity Pain improves with: therapy/exercise, pacing activities, and medication Relief from Meds: 3  Family History  Problem Relation Age of Onset   Cancer Mother        breast, and in remission, dx 97s   Diabetes Mother    Hypertension Mother    Colon cancer Mother        dx at age 59   Congestive Heart Failure Father    Hypertension Father    Stomach cancer Maternal Uncle    Heart disease Paternal Aunt    Cancer Paternal Uncle        colon   Diabetes Maternal Grandmother    Hypertension Maternal Grandmother    Diabetes Maternal Grandfather    Kidney disease Maternal Grandfather    Diabetes Paternal Grandmother    Cancer Paternal Grandfather        colon   Autism Son    Colon polyps Half-Sister        colectomy   Social History   Socioeconomic History   Marital status: Married    Spouse name: Roderick Pee   Number of children: 3   Years of education: Not on file   Highest  education level: Not on file  Occupational History   Not on file  Tobacco Use   Smoking status: Never   Smokeless tobacco: Never  Vaping Use   Vaping Use: Never used  Substance and Sexual Activity   Alcohol use: Not Currently   Drug use: No   Sexual activity: Yes    Partners: Male    Birth control/protection: None  Other Topics Concern   Not on file  Social History Narrative   Not on file   Social Determinants of Health   Financial Resource Strain: High Risk (05/26/2021)   Overall Financial Resource Strain (CARDIA)    Difficulty of Paying Living Expenses: Hard  Food Insecurity: Food Insecurity Present (05/26/2021)   Hunger Vital Sign    Worried About Running Out of Food in the Last Year: Sometimes true    Ran Out of Food in the Last Year: Sometimes true   Transportation Needs: No Transportation Needs (05/26/2021)   PRAPARE - Hydrologist (Medical): No    Lack of Transportation (Non-Medical): No  Physical Activity: Not on file  Stress: Stress Concern Present (05/26/2021)   [place]    Feeling of Stress : Very much  Social Connections: Moderately Integrated (05/26/2021)   Social Connection and Isolation Panel [NHANES]    Frequency of Communication with Friends and Family: More than three times a week    Frequency of Social Gatherings with Friends and Family: More than three times a week    Attends Religious Services: More than 4 times per year    Active Member of Clubs or Organizations: No    Attends Archivist Meetings: Never    Marital Status: Married   Past Surgical History:  Procedure Laterality Date   COLECTOMY WITH COLOSTOMY CREATION/HARTMANN PROCEDURE N/A 04/15/2021   Procedure: COLOSTOMY CREATION/HARTMANN PROCEDURE;  Surgeon: [name], MD;  Location: [facility];  Service: General;  Laterality: N/A;   IR CATHETER TUBE CHANGE  12/12/2020   IR CATHETER TUBE CHANGE  01/27/2021   IR CATHETER TUBE CHANGE  02/19/2021   IR CATHETER TUBE CHANGE  04/13/2021   IR IMAGING GUIDED PORT INSERTION  10/02/2021   IR RADIOLOGIST EVAL & MGMT  11/27/2020   IR RADIOLOGIST EVAL & MGMT  12/11/2020   IR RADIOLOGIST EVAL & MGMT  01/07/2021   IR RADIOLOGIST EVAL & MGMT  02/04/2021   IR SINUS/FIST TUBE CHK-NON GI  12/12/2020   IR SINUS/FIST TUBE CHK-NON GI  02/19/2021   LAPAROSCOPIC PARTIAL COLECTOMY N/A 04/15/2021   Procedure: LAPAROSCOPIC ASSISTED HARTMANN RESECTION;  Surgeon: [name], MD;  Location: [facility];  Service: General;  Laterality: N/A;   Past Surgical History:  Procedure Laterality Date   COLECTOMY WITH COLOSTOMY CREATION/HARTMANN PROCEDURE N/A 04/15/2021   Procedure: COLOSTOMY CREATION/HARTMANN PROCEDURE;  Surgeon: [name],  MD;  Location: [facility];  Service: General;  Laterality: N/A;   IR CATHETER TUBE CHANGE  12/12/2020   IR CATHETER TUBE CHANGE  01/27/2021   IR CATHETER TUBE CHANGE  02/19/2021   IR CATHETER TUBE CHANGE  04/13/2021   IR IMAGING GUIDED PORT INSERTION  10/02/2021   IR RADIOLOGIST EVAL & MGMT  11/27/2020   IR RADIOLOGIST EVAL & MGMT  12/11/2020   IR RADIOLOGIST EVAL & MGMT  01/07/2021   IR RADIOLOGIST EVAL & MGMT  02/04/2021   IR SINUS/FIST TUBE CHK-NON GI  12/12/2020   IR SINUS/FIST TUBE CHK-NON GI  02/19/2021  LAPAROSCOPIC PARTIAL COLECTOMY N/A 04/15/2021   Procedure: LAPAROSCOPIC ASSISTED HARTMANN RESECTION;  Surgeon: [name], MD;  Location: [facility];  Service: General;  Laterality: N/A;   Past Medical History:  Diagnosis Date   Allergy    seasonal   Anemia    Blood infection 1985   Blood transfusion without reported diagnosis    Diverticulitis    Family history of breast cancer    Family history of colon cancer    Family history of stomach cancer    Hypertension    Obesity    Sleep apnea    BP 125/88   Pulse 80   Ht 5' 3"  (1.6 m)   Wt 192 lb 12.8 oz (87.5 kg)   SpO2 99%   BMI 34.15 kg/m   Opioid Risk Score:   Fall Risk Score:  `1  Depression screen [facility] 2/9     10/19/2021    2:54 PM 08/12/2021   10:56 AM 07/14/2021    3:30 PM 10/06/2020    1:59 PM 08/14/2019    4:09 PM 07/04/2019    4:27 PM 11/20/2018   11:49 AM  Depression screen PHQ 2/9  Decreased Interest 1 3 2  0 0 0 0  Down, Depressed, Hopeless 0 1 1 0 0 0 0  PHQ - 2 Score 1 4 3  0 0 0 0  Altered sleeping  3 3      Tired, decreased energy  3 3      Change in appetite  3 1      Feeling bad or failure about yourself   1 0      Trouble concentrating  1 1      Moving slowly or fidgety/restless  1 1      Suicidal thoughts  0 0      PHQ-9 Score  16 12      Difficult doing work/chores  Very difficult Somewhat difficult         Review of Systems  Musculoskeletal:  Positive for back pain and joint swelling.        Joint pain  Neurological:  Positive for weakness.  All other systems reviewed and are negative.      Objective:   Physical Exam  Awake, alert, appropriate, wearing mask; no AD; uses husband to walk well; husband in room, NAD Wincing during MS exam- very TTP throughout MS: 4/5 in deltoids, biceps, triceps, WE, grip and 3+/5 FA B/L- pain vs debility? LE's 4/5 in HF, KE, KF, DF and PF B/L- against mainly limited by pain vs debility Constantly rubbing legs/moving hands/wrists/rubbing fingers Abd; colostomy with pasty looser stool in bag- dressing L lower abdomen for boil dressing C/D/I     Assessment & Plan:   Pt is a 36 yr old female with hx of colon adenocarcinoma s/p L colectomy/colostomy- 04/15/21-  Also had Small bowel resection and L flank abscess.  Also had anasarca due to protein malnutrition and sinus tachycardia.  Here for hospital f/u on debility- - discharged in 12/22.   Anasarca has resolve.d    Cymbalta/Duloxetine 30 mg nightly vs. daily-  x 3 days (take dose from home for those 3 days). - then 60 mg daily vs nightly- depending on sleepiness-   Can take longer to have an orgasm.  2. Don't stop Dilaudid!!!! Get from palliative care.   3. If Cymbalta doesn't work, will take 3-4 weeks to hit maximal effect- will start working in 1 week from starting 60 mg - if by  3-4 weeks, NO improvement, call me and will something else.   4. Need to know if Gabapentin/Lyrica would interact with Chemo. Find out from Oncology.   5. Discussed intimacy- please indulge!   6. Call in 4 weeks ot let me know how things going  7. F/U in 3 months- f/u on debility and cancer/chemo pain   I spent a total of  34  minutes on total care today- >50% coordination of care- due to discussion about intimacy and pain as above.

## 2021-11-25 NOTE — Patient Instructions (Signed)
Pt is a 36 yr old female with hx of colon adenocarcinoma s/p L colectomy/colostomy- 04/15/21-  Also had Small bowel resection and L flank abscess.  Also had anasarca due to protein malnutrition and sinus tachycardia.  Here for hospital f/u on debility- - discharged in 12/22.   Anasarca has resolve.d    Cymbalta/Duloxetine 30 mg nightly vs. daily-  x 3 days (take dose from home for those 3 days). - then 60 mg daily vs nightly- depending on sleepiness-   Can take longer to have an orgasm.  2. Don't stop Dilaudid!!!! Get from palliative care.   3. If Cymbalta doesn't work, will take 3-4 weeks to hit maximal effect- will start working in 1 week from starting 60 mg - if by 3-4 weeks, NO improvement, call me and will something else.   4. Need to know if Gabapentin/Lyrica would interact with Chemo. Find out from Oncology.   5. Discussed intimacy- please indulge!   6. Call in 4 weeks ot let me know how things going  7. F/U in 3 months- f/u on debility and cancer/chemo pain

## 2021-11-26 DIAGNOSIS — Z6831 Body mass index (BMI) 31.0-31.9, adult: Secondary | ICD-10-CM | POA: Diagnosis not present

## 2021-11-26 DIAGNOSIS — Z9181 History of falling: Secondary | ICD-10-CM | POA: Diagnosis not present

## 2021-11-26 DIAGNOSIS — E440 Moderate protein-calorie malnutrition: Secondary | ICD-10-CM | POA: Diagnosis not present

## 2021-11-26 DIAGNOSIS — E876 Hypokalemia: Secondary | ICD-10-CM | POA: Diagnosis not present

## 2021-11-26 DIAGNOSIS — F419 Anxiety disorder, unspecified: Secondary | ICD-10-CM | POA: Diagnosis not present

## 2021-11-26 DIAGNOSIS — Z483 Aftercare following surgery for neoplasm: Secondary | ICD-10-CM | POA: Diagnosis not present

## 2021-11-26 DIAGNOSIS — Z9049 Acquired absence of other specified parts of digestive tract: Secondary | ICD-10-CM | POA: Diagnosis not present

## 2021-11-26 DIAGNOSIS — F32A Depression, unspecified: Secondary | ICD-10-CM | POA: Diagnosis not present

## 2021-11-26 DIAGNOSIS — Z933 Colostomy status: Secondary | ICD-10-CM | POA: Diagnosis not present

## 2021-11-26 DIAGNOSIS — D630 Anemia in neoplastic disease: Secondary | ICD-10-CM | POA: Diagnosis not present

## 2021-11-26 DIAGNOSIS — E669 Obesity, unspecified: Secondary | ICD-10-CM | POA: Diagnosis not present

## 2021-11-26 DIAGNOSIS — G4730 Sleep apnea, unspecified: Secondary | ICD-10-CM | POA: Diagnosis not present

## 2021-11-26 DIAGNOSIS — Z79891 Long term (current) use of opiate analgesic: Secondary | ICD-10-CM | POA: Diagnosis not present

## 2021-11-26 DIAGNOSIS — C189 Malignant neoplasm of colon, unspecified: Secondary | ICD-10-CM | POA: Diagnosis not present

## 2021-11-26 DIAGNOSIS — I10 Essential (primary) hypertension: Secondary | ICD-10-CM | POA: Diagnosis not present

## 2021-11-26 DIAGNOSIS — K5792 Diverticulitis of intestine, part unspecified, without perforation or abscess without bleeding: Secondary | ICD-10-CM | POA: Diagnosis not present

## 2021-12-01 ENCOUNTER — Inpatient Hospital Stay: Payer: BC Managed Care – PPO

## 2021-12-01 ENCOUNTER — Other Ambulatory Visit: Payer: Self-pay

## 2021-12-01 ENCOUNTER — Telehealth: Payer: Self-pay

## 2021-12-01 ENCOUNTER — Encounter: Payer: Self-pay | Admitting: Hematology

## 2021-12-01 ENCOUNTER — Other Ambulatory Visit: Payer: BC Managed Care – PPO

## 2021-12-01 ENCOUNTER — Ambulatory Visit: Payer: BC Managed Care – PPO

## 2021-12-01 ENCOUNTER — Ambulatory Visit: Payer: BC Managed Care – PPO | Admitting: Hematology

## 2021-12-01 ENCOUNTER — Inpatient Hospital Stay (HOSPITAL_BASED_OUTPATIENT_CLINIC_OR_DEPARTMENT_OTHER): Payer: BC Managed Care – PPO | Admitting: Hematology

## 2021-12-01 ENCOUNTER — Inpatient Hospital Stay: Payer: BC Managed Care – PPO | Admitting: Dietician

## 2021-12-01 VITALS — BP 122/74 | HR 87 | Temp 98.6°F | Resp 17 | Ht 63.0 in | Wt 189.5 lb

## 2021-12-01 VITALS — BP 116/69 | HR 79 | Temp 98.3°F | Resp 17

## 2021-12-01 DIAGNOSIS — D3502 Benign neoplasm of left adrenal gland: Secondary | ICD-10-CM | POA: Diagnosis not present

## 2021-12-01 DIAGNOSIS — Z5111 Encounter for antineoplastic chemotherapy: Secondary | ICD-10-CM | POA: Diagnosis not present

## 2021-12-01 DIAGNOSIS — K760 Fatty (change of) liver, not elsewhere classified: Secondary | ICD-10-CM | POA: Diagnosis not present

## 2021-12-01 DIAGNOSIS — D509 Iron deficiency anemia, unspecified: Secondary | ICD-10-CM | POA: Diagnosis not present

## 2021-12-01 DIAGNOSIS — J90 Pleural effusion, not elsewhere classified: Secondary | ICD-10-CM | POA: Diagnosis not present

## 2021-12-01 DIAGNOSIS — Z452 Encounter for adjustment and management of vascular access device: Secondary | ICD-10-CM

## 2021-12-01 DIAGNOSIS — E876 Hypokalemia: Secondary | ICD-10-CM

## 2021-12-01 DIAGNOSIS — K5732 Diverticulitis of large intestine without perforation or abscess without bleeding: Secondary | ICD-10-CM | POA: Diagnosis not present

## 2021-12-01 DIAGNOSIS — F32A Depression, unspecified: Secondary | ICD-10-CM | POA: Diagnosis not present

## 2021-12-01 DIAGNOSIS — C187 Malignant neoplasm of sigmoid colon: Secondary | ICD-10-CM | POA: Diagnosis not present

## 2021-12-01 DIAGNOSIS — L02211 Cutaneous abscess of abdominal wall: Secondary | ICD-10-CM | POA: Diagnosis not present

## 2021-12-01 DIAGNOSIS — Z515 Encounter for palliative care: Secondary | ICD-10-CM

## 2021-12-01 DIAGNOSIS — M2550 Pain in unspecified joint: Secondary | ICD-10-CM | POA: Diagnosis not present

## 2021-12-01 DIAGNOSIS — Z1501 Genetic susceptibility to malignant neoplasm of breast: Secondary | ICD-10-CM | POA: Diagnosis not present

## 2021-12-01 DIAGNOSIS — I10 Essential (primary) hypertension: Secondary | ICD-10-CM | POA: Diagnosis not present

## 2021-12-01 DIAGNOSIS — D649 Anemia, unspecified: Secondary | ICD-10-CM | POA: Diagnosis not present

## 2021-12-01 DIAGNOSIS — K8020 Calculus of gallbladder without cholecystitis without obstruction: Secondary | ICD-10-CM | POA: Diagnosis not present

## 2021-12-01 DIAGNOSIS — D259 Leiomyoma of uterus, unspecified: Secondary | ICD-10-CM | POA: Diagnosis not present

## 2021-12-01 DIAGNOSIS — K6819 Other retroperitoneal abscess: Secondary | ICD-10-CM | POA: Diagnosis not present

## 2021-12-01 LAB — CMP (CANCER CENTER ONLY)
ALT: 6 U/L (ref 0–44)
AST: 9 U/L — ABNORMAL LOW (ref 15–41)
Albumin: 3.9 g/dL (ref 3.5–5.0)
Alkaline Phosphatase: 48 U/L (ref 38–126)
Anion gap: 8 (ref 5–15)
BUN: 10 mg/dL (ref 6–20)
CO2: 27 mmol/L (ref 22–32)
Calcium: 9.1 mg/dL (ref 8.9–10.3)
Chloride: 105 mmol/L (ref 98–111)
Creatinine: 0.54 mg/dL (ref 0.44–1.00)
GFR, Estimated: >60 mL/min (ref >60)
Glucose, Bld: 105 mg/dL — ABNORMAL HIGH (ref 70–99)
Potassium: 3 mmol/L — ABNORMAL LOW (ref 3.5–5.1)
Sodium: 140 mmol/L (ref 135–145)
Total Bilirubin: 0.3 mg/dL (ref 0.3–1.2)
Total Protein: 7.2 g/dL (ref 6.5–8.1)

## 2021-12-01 LAB — CBC WITH DIFFERENTIAL (CANCER CENTER ONLY)
Abs Immature Granulocytes: 0.02 10*3/uL (ref 0.00–0.07)
Basophils Absolute: 0 10*3/uL (ref 0.0–0.1)
Basophils Relative: 0 %
Eosinophils Absolute: 0.1 10*3/uL (ref 0.0–0.5)
Eosinophils Relative: 1 %
HCT: 29.7 % — ABNORMAL LOW (ref 36.0–46.0)
Hemoglobin: 9.1 g/dL — ABNORMAL LOW (ref 12.0–15.0)
Immature Granulocytes: 0 %
Lymphocytes Relative: 23 %
Lymphs Abs: 1.6 10*3/uL (ref 0.7–4.0)
MCH: 24.1 pg — ABNORMAL LOW (ref 26.0–34.0)
MCHC: 30.6 g/dL (ref 30.0–36.0)
MCV: 78.6 fL — ABNORMAL LOW (ref 80.0–100.0)
Monocytes Absolute: 0.3 10*3/uL (ref 0.1–1.0)
Monocytes Relative: 4 %
Neutro Abs: 4.9 10*3/uL (ref 1.7–7.7)
Neutrophils Relative %: 72 %
Platelet Count: 500 10*3/uL — ABNORMAL HIGH (ref 150–400)
RBC: 3.78 MIL/uL — ABNORMAL LOW (ref 3.87–5.11)
RDW: 17.5 % — ABNORMAL HIGH (ref 11.5–15.5)
WBC Count: 7 10*3/uL (ref 4.0–10.5)
nRBC: 0 % (ref 0.0–0.2)

## 2021-12-01 LAB — CEA (IN HOUSE-CHCC): CEA (CHCC-In House): 1.32 ng/mL (ref 0.00–5.00)

## 2021-12-01 LAB — TSH: TSH: 1.867 u[IU]/mL (ref 0.350–4.500)

## 2021-12-01 MED ORDER — SODIUM CHLORIDE 0.9% FLUSH
10.0000 mL | INTRAVENOUS | Status: DC | PRN
  Administered 2021-12-01: 10 mL

## 2021-12-01 MED ORDER — SODIUM CHLORIDE 0.9 % IV SOLN
200.0000 mg | Freq: Once | INTRAVENOUS | Status: AC
  Administered 2021-12-01: 200 mg via INTRAVENOUS
  Filled 2021-12-01: qty 200

## 2021-12-01 MED ORDER — SODIUM CHLORIDE 0.9 % IV SOLN: Freq: Once | INTRAVENOUS | Status: DC

## 2021-12-01 MED ORDER — SODIUM CHLORIDE 0.9% FLUSH
10.0000 mL | Freq: Once | INTRAVENOUS | Status: AC
  Administered 2021-12-01: 10 mL

## 2021-12-01 MED ORDER — HEPARIN SOD (PORK) LOCK FLUSH 100 UNIT/ML IV SOLN
500.0000 [IU] | Freq: Once | INTRAVENOUS | Status: AC | PRN
  Administered 2021-12-01: 500 [IU]

## 2021-12-01 MED ORDER — SODIUM CHLORIDE 0.9 % IV SOLN: Freq: Once | INTRAVENOUS | Status: AC

## 2021-12-01 MED ORDER — POTASSIUM CHLORIDE 20 MEQ/15ML (10%) PO SOLN: 60.0000 meq | Freq: Every day | ORAL | 3 refills | Status: DC

## 2021-12-02 ENCOUNTER — Telehealth: Payer: Self-pay | Admitting: Hematology

## 2021-12-02 NOTE — Telephone Encounter (Signed)
Scheduled follow-up appointments per 6/27 los. Patient is aware. 

## 2021-12-03 DIAGNOSIS — Z79891 Long term (current) use of opiate analgesic: Secondary | ICD-10-CM | POA: Diagnosis not present

## 2021-12-03 DIAGNOSIS — C189 Malignant neoplasm of colon, unspecified: Secondary | ICD-10-CM | POA: Diagnosis not present

## 2021-12-03 DIAGNOSIS — I10 Essential (primary) hypertension: Secondary | ICD-10-CM | POA: Diagnosis not present

## 2021-12-03 DIAGNOSIS — F32A Depression, unspecified: Secondary | ICD-10-CM | POA: Diagnosis not present

## 2021-12-03 DIAGNOSIS — E440 Moderate protein-calorie malnutrition: Secondary | ICD-10-CM | POA: Diagnosis not present

## 2021-12-03 DIAGNOSIS — F419 Anxiety disorder, unspecified: Secondary | ICD-10-CM | POA: Diagnosis not present

## 2021-12-03 DIAGNOSIS — D630 Anemia in neoplastic disease: Secondary | ICD-10-CM | POA: Diagnosis not present

## 2021-12-03 DIAGNOSIS — Z933 Colostomy status: Secondary | ICD-10-CM | POA: Diagnosis not present

## 2021-12-03 DIAGNOSIS — Z483 Aftercare following surgery for neoplasm: Secondary | ICD-10-CM | POA: Diagnosis not present

## 2021-12-03 DIAGNOSIS — Z9181 History of falling: Secondary | ICD-10-CM | POA: Diagnosis not present

## 2021-12-03 DIAGNOSIS — K5792 Diverticulitis of intestine, part unspecified, without perforation or abscess without bleeding: Secondary | ICD-10-CM | POA: Diagnosis not present

## 2021-12-03 DIAGNOSIS — E876 Hypokalemia: Secondary | ICD-10-CM | POA: Diagnosis not present

## 2021-12-03 DIAGNOSIS — Z9049 Acquired absence of other specified parts of digestive tract: Secondary | ICD-10-CM | POA: Diagnosis not present

## 2021-12-03 DIAGNOSIS — E669 Obesity, unspecified: Secondary | ICD-10-CM | POA: Diagnosis not present

## 2021-12-03 DIAGNOSIS — G4730 Sleep apnea, unspecified: Secondary | ICD-10-CM | POA: Diagnosis not present

## 2021-12-03 DIAGNOSIS — Z6831 Body mass index (BMI) 31.0-31.9, adult: Secondary | ICD-10-CM | POA: Diagnosis not present

## 2021-12-07 ENCOUNTER — Encounter: Payer: Self-pay | Admitting: Nurse Practitioner

## 2021-12-07 ENCOUNTER — Other Ambulatory Visit: Payer: Self-pay

## 2021-12-07 ENCOUNTER — Inpatient Hospital Stay: Payer: BC Managed Care – PPO | Attending: Hematology

## 2021-12-07 VITALS — BP 122/90 | HR 82 | Temp 98.2°F | Resp 17

## 2021-12-07 DIAGNOSIS — Z5112 Encounter for antineoplastic immunotherapy: Secondary | ICD-10-CM | POA: Insufficient documentation

## 2021-12-07 DIAGNOSIS — M2550 Pain in unspecified joint: Secondary | ICD-10-CM | POA: Diagnosis not present

## 2021-12-07 DIAGNOSIS — Z452 Encounter for adjustment and management of vascular access device: Secondary | ICD-10-CM

## 2021-12-07 DIAGNOSIS — F32A Depression, unspecified: Secondary | ICD-10-CM | POA: Diagnosis not present

## 2021-12-07 DIAGNOSIS — D649 Anemia, unspecified: Secondary | ICD-10-CM | POA: Diagnosis not present

## 2021-12-07 DIAGNOSIS — L02211 Cutaneous abscess of abdominal wall: Secondary | ICD-10-CM | POA: Insufficient documentation

## 2021-12-07 DIAGNOSIS — C187 Malignant neoplasm of sigmoid colon: Secondary | ICD-10-CM | POA: Insufficient documentation

## 2021-12-07 DIAGNOSIS — F410 Panic disorder [episodic paroxysmal anxiety] without agoraphobia: Secondary | ICD-10-CM | POA: Diagnosis not present

## 2021-12-07 DIAGNOSIS — Z515 Encounter for palliative care: Secondary | ICD-10-CM

## 2021-12-07 DIAGNOSIS — R300 Dysuria: Secondary | ICD-10-CM | POA: Diagnosis not present

## 2021-12-07 DIAGNOSIS — D75839 Thrombocytosis, unspecified: Secondary | ICD-10-CM | POA: Diagnosis not present

## 2021-12-07 MED ORDER — SODIUM CHLORIDE 0.9% FLUSH
10.0000 mL | Freq: Once | INTRAVENOUS | Status: AC | PRN
  Administered 2021-12-07: 10 mL

## 2021-12-07 MED ORDER — HEPARIN SOD (PORK) LOCK FLUSH 100 UNIT/ML IV SOLN
500.0000 [IU] | Freq: Once | INTRAVENOUS | Status: AC | PRN
  Administered 2021-12-07: 500 [IU]

## 2021-12-07 MED ORDER — SODIUM CHLORIDE 0.9 % IV SOLN
200.0000 mg | Freq: Once | INTRAVENOUS | Status: AC
  Administered 2021-12-07: 200 mg via INTRAVENOUS
  Filled 2021-12-07: qty 200

## 2021-12-07 MED ORDER — SODIUM CHLORIDE 0.9 % IV SOLN: Freq: Once | INTRAVENOUS | Status: AC

## 2021-12-07 NOTE — Patient Instructions (Signed)

## 2021-12-09 DIAGNOSIS — E669 Obesity, unspecified: Secondary | ICD-10-CM | POA: Diagnosis not present

## 2021-12-09 DIAGNOSIS — C189 Malignant neoplasm of colon, unspecified: Secondary | ICD-10-CM | POA: Diagnosis not present

## 2021-12-09 DIAGNOSIS — K5792 Diverticulitis of intestine, part unspecified, without perforation or abscess without bleeding: Secondary | ICD-10-CM | POA: Diagnosis not present

## 2021-12-09 DIAGNOSIS — E876 Hypokalemia: Secondary | ICD-10-CM | POA: Diagnosis not present

## 2021-12-09 DIAGNOSIS — F419 Anxiety disorder, unspecified: Secondary | ICD-10-CM | POA: Diagnosis not present

## 2021-12-09 DIAGNOSIS — Z933 Colostomy status: Secondary | ICD-10-CM | POA: Diagnosis not present

## 2021-12-09 DIAGNOSIS — Z6831 Body mass index (BMI) 31.0-31.9, adult: Secondary | ICD-10-CM | POA: Diagnosis not present

## 2021-12-09 DIAGNOSIS — Z9181 History of falling: Secondary | ICD-10-CM | POA: Diagnosis not present

## 2021-12-09 DIAGNOSIS — Z9049 Acquired absence of other specified parts of digestive tract: Secondary | ICD-10-CM | POA: Diagnosis not present

## 2021-12-09 DIAGNOSIS — F32A Depression, unspecified: Secondary | ICD-10-CM | POA: Diagnosis not present

## 2021-12-09 DIAGNOSIS — Z483 Aftercare following surgery for neoplasm: Secondary | ICD-10-CM | POA: Diagnosis not present

## 2021-12-09 DIAGNOSIS — Z79891 Long term (current) use of opiate analgesic: Secondary | ICD-10-CM | POA: Diagnosis not present

## 2021-12-09 DIAGNOSIS — E440 Moderate protein-calorie malnutrition: Secondary | ICD-10-CM | POA: Diagnosis not present

## 2021-12-09 DIAGNOSIS — G4730 Sleep apnea, unspecified: Secondary | ICD-10-CM | POA: Diagnosis not present

## 2021-12-09 DIAGNOSIS — D630 Anemia in neoplastic disease: Secondary | ICD-10-CM | POA: Diagnosis not present

## 2021-12-09 DIAGNOSIS — I10 Essential (primary) hypertension: Secondary | ICD-10-CM | POA: Diagnosis not present

## 2021-12-14 ENCOUNTER — Ambulatory Visit (HOSPITAL_COMMUNITY)
Admission: RE | Admit: 2021-12-14 | Discharge: 2021-12-14 | Disposition: A | Discharge status: Home / Self Care | Payer: BC Managed Care – PPO | Source: Ambulatory Visit | Attending: Nurse Practitioner | Admitting: Nurse Practitioner

## 2021-12-14 DIAGNOSIS — K9400 Colostomy complication, unspecified: Secondary | ICD-10-CM

## 2021-12-14 DIAGNOSIS — Z933 Colostomy status: Secondary | ICD-10-CM | POA: Insufficient documentation

## 2021-12-14 DIAGNOSIS — C189 Malignant neoplasm of colon, unspecified: Secondary | ICD-10-CM | POA: Diagnosis not present

## 2021-12-14 NOTE — Progress Notes (Signed)
[facility]   Reason for visit:  LUQ colostomy HPI:  Colon Cancer resulting in resection/end colostomy ROS  Review of Systems  Gastrointestinal:        LUQ colostomy  Musculoskeletal:  Positive for arthralgias.  Skin:  Positive for color change.       Peristomal redness at times.   All other systems reviewed and are negative.  Vital signs:  BP 134/87 (BP Location: Right Arm)   Pulse 76   Temp 98 F (36.7 C) (Oral)   Resp 18   SpO2 100%  Exam:  Physical Exam Vitals reviewed.  Abdominal:     Palpations: Abdomen is soft.  Skin:    General: Skin is warm and dry.     Findings: Erythema present.  Neurological:     Mental Status: She is alert and oriented to person, place, and time.  Psychiatric:        Mood and Affect: Mood normal.     Stoma type/location:  1 3/8" pink and moist LUQ colostomy Stomal assessment/size:  1 3/8" Peristomal assessment:  intact Treatment options for stomal/peristomal skin: barrier ring Output: soft brown stool Ostomy pouching: 2pc. 2 3/4" pouch with barrier ring  Education provided:  pouch change provided  Needs to be set up with [facility]    Impression/dx  colostomy Discussion  Need to set up for supplies Plan  See back in 2 weeks.     Visit time: 55 minutes.   Domenic Moras FNP-BC

## 2021-12-14 NOTE — Discharge Instructions (Signed)
Call [facility]

## 2021-12-15 ENCOUNTER — Other Ambulatory Visit: Payer: Self-pay

## 2021-12-15 ENCOUNTER — Inpatient Hospital Stay: Payer: BC Managed Care – PPO

## 2021-12-15 VITALS — BP 121/80 | HR 87 | Temp 97.8°F | Resp 17

## 2021-12-15 DIAGNOSIS — D75839 Thrombocytosis, unspecified: Secondary | ICD-10-CM | POA: Diagnosis not present

## 2021-12-15 DIAGNOSIS — L02211 Cutaneous abscess of abdominal wall: Secondary | ICD-10-CM | POA: Diagnosis not present

## 2021-12-15 DIAGNOSIS — Z5112 Encounter for antineoplastic immunotherapy: Secondary | ICD-10-CM | POA: Diagnosis not present

## 2021-12-15 DIAGNOSIS — M2550 Pain in unspecified joint: Secondary | ICD-10-CM | POA: Diagnosis not present

## 2021-12-15 DIAGNOSIS — D649 Anemia, unspecified: Secondary | ICD-10-CM | POA: Diagnosis not present

## 2021-12-15 DIAGNOSIS — C187 Malignant neoplasm of sigmoid colon: Secondary | ICD-10-CM | POA: Diagnosis not present

## 2021-12-15 DIAGNOSIS — Z515 Encounter for palliative care: Secondary | ICD-10-CM

## 2021-12-15 DIAGNOSIS — R300 Dysuria: Secondary | ICD-10-CM | POA: Diagnosis not present

## 2021-12-15 DIAGNOSIS — Z452 Encounter for adjustment and management of vascular access device: Secondary | ICD-10-CM

## 2021-12-15 DIAGNOSIS — F32A Depression, unspecified: Secondary | ICD-10-CM | POA: Diagnosis not present

## 2021-12-15 DIAGNOSIS — F410 Panic disorder [episodic paroxysmal anxiety] without agoraphobia: Secondary | ICD-10-CM | POA: Diagnosis not present

## 2021-12-15 MED ORDER — HEPARIN SOD (PORK) LOCK FLUSH 100 UNIT/ML IV SOLN
500.0000 [IU] | Freq: Once | INTRAVENOUS | Status: AC | PRN
  Administered 2021-12-15: 500 [IU]

## 2021-12-15 MED ORDER — SODIUM CHLORIDE 0.9% FLUSH
10.0000 mL | Freq: Once | INTRAVENOUS | Status: AC | PRN
  Administered 2021-12-15: 10 mL

## 2021-12-15 MED ORDER — SODIUM CHLORIDE 0.9 % IV SOLN: Freq: Once | INTRAVENOUS | Status: AC

## 2021-12-15 MED ORDER — SODIUM CHLORIDE 0.9 % IV SOLN
200.0000 mg | Freq: Once | INTRAVENOUS | Status: AC
  Administered 2021-12-15: 200 mg via INTRAVENOUS
  Filled 2021-12-15: qty 200

## 2021-12-15 NOTE — Patient Instructions (Signed)

## 2021-12-16 DIAGNOSIS — Z79891 Long term (current) use of opiate analgesic: Secondary | ICD-10-CM | POA: Diagnosis not present

## 2021-12-16 DIAGNOSIS — Z9049 Acquired absence of other specified parts of digestive tract: Secondary | ICD-10-CM | POA: Diagnosis not present

## 2021-12-16 DIAGNOSIS — Z483 Aftercare following surgery for neoplasm: Secondary | ICD-10-CM | POA: Diagnosis not present

## 2021-12-16 DIAGNOSIS — I10 Essential (primary) hypertension: Secondary | ICD-10-CM | POA: Diagnosis not present

## 2021-12-16 DIAGNOSIS — G4730 Sleep apnea, unspecified: Secondary | ICD-10-CM | POA: Diagnosis not present

## 2021-12-16 DIAGNOSIS — Z6831 Body mass index (BMI) 31.0-31.9, adult: Secondary | ICD-10-CM | POA: Diagnosis not present

## 2021-12-16 DIAGNOSIS — D630 Anemia in neoplastic disease: Secondary | ICD-10-CM | POA: Diagnosis not present

## 2021-12-16 DIAGNOSIS — E440 Moderate protein-calorie malnutrition: Secondary | ICD-10-CM | POA: Diagnosis not present

## 2021-12-16 DIAGNOSIS — E876 Hypokalemia: Secondary | ICD-10-CM | POA: Diagnosis not present

## 2021-12-16 DIAGNOSIS — E669 Obesity, unspecified: Secondary | ICD-10-CM | POA: Diagnosis not present

## 2021-12-16 DIAGNOSIS — Z933 Colostomy status: Secondary | ICD-10-CM | POA: Diagnosis not present

## 2021-12-16 DIAGNOSIS — F419 Anxiety disorder, unspecified: Secondary | ICD-10-CM | POA: Diagnosis not present

## 2021-12-16 DIAGNOSIS — C189 Malignant neoplasm of colon, unspecified: Secondary | ICD-10-CM | POA: Diagnosis not present

## 2021-12-16 DIAGNOSIS — K5792 Diverticulitis of intestine, part unspecified, without perforation or abscess without bleeding: Secondary | ICD-10-CM | POA: Diagnosis not present

## 2021-12-16 DIAGNOSIS — F32A Depression, unspecified: Secondary | ICD-10-CM | POA: Diagnosis not present

## 2021-12-16 DIAGNOSIS — Z9181 History of falling: Secondary | ICD-10-CM | POA: Diagnosis not present

## 2021-12-17 ENCOUNTER — Other Ambulatory Visit: Payer: Self-pay | Admitting: Physical Medicine and Rehabilitation

## 2021-12-18 ENCOUNTER — Encounter (HOSPITAL_COMMUNITY): Payer: Self-pay | Admitting: Nurse Practitioner

## 2021-12-18 ENCOUNTER — Other Ambulatory Visit: Payer: Self-pay | Admitting: Nurse Practitioner

## 2021-12-18 DIAGNOSIS — I10 Essential (primary) hypertension: Secondary | ICD-10-CM

## 2021-12-21 DIAGNOSIS — I10 Essential (primary) hypertension: Secondary | ICD-10-CM | POA: Diagnosis not present

## 2021-12-21 DIAGNOSIS — K5792 Diverticulitis of intestine, part unspecified, without perforation or abscess without bleeding: Secondary | ICD-10-CM | POA: Diagnosis not present

## 2021-12-21 DIAGNOSIS — G4730 Sleep apnea, unspecified: Secondary | ICD-10-CM | POA: Diagnosis not present

## 2021-12-21 DIAGNOSIS — Z9181 History of falling: Secondary | ICD-10-CM | POA: Diagnosis not present

## 2021-12-21 DIAGNOSIS — Z79891 Long term (current) use of opiate analgesic: Secondary | ICD-10-CM | POA: Diagnosis not present

## 2021-12-21 DIAGNOSIS — F419 Anxiety disorder, unspecified: Secondary | ICD-10-CM | POA: Diagnosis not present

## 2021-12-21 DIAGNOSIS — E440 Moderate protein-calorie malnutrition: Secondary | ICD-10-CM | POA: Diagnosis not present

## 2021-12-21 DIAGNOSIS — Z933 Colostomy status: Secondary | ICD-10-CM | POA: Diagnosis not present

## 2021-12-21 DIAGNOSIS — E669 Obesity, unspecified: Secondary | ICD-10-CM | POA: Diagnosis not present

## 2021-12-21 DIAGNOSIS — C189 Malignant neoplasm of colon, unspecified: Secondary | ICD-10-CM | POA: Diagnosis not present

## 2021-12-21 DIAGNOSIS — Z483 Aftercare following surgery for neoplasm: Secondary | ICD-10-CM | POA: Diagnosis not present

## 2021-12-21 DIAGNOSIS — E876 Hypokalemia: Secondary | ICD-10-CM | POA: Diagnosis not present

## 2021-12-21 DIAGNOSIS — F32A Depression, unspecified: Secondary | ICD-10-CM | POA: Diagnosis not present

## 2021-12-21 DIAGNOSIS — C786 Secondary malignant neoplasm of retroperitoneum and peritoneum: Secondary | ICD-10-CM | POA: Insufficient documentation

## 2021-12-21 DIAGNOSIS — D630 Anemia in neoplastic disease: Secondary | ICD-10-CM | POA: Diagnosis not present

## 2021-12-21 DIAGNOSIS — Z9049 Acquired absence of other specified parts of digestive tract: Secondary | ICD-10-CM | POA: Diagnosis not present

## 2021-12-21 DIAGNOSIS — Z6831 Body mass index (BMI) 31.0-31.9, adult: Secondary | ICD-10-CM | POA: Diagnosis not present

## 2021-12-22 ENCOUNTER — Inpatient Hospital Stay: Payer: BC Managed Care – PPO

## 2021-12-22 ENCOUNTER — Inpatient Hospital Stay (HOSPITAL_BASED_OUTPATIENT_CLINIC_OR_DEPARTMENT_OTHER): Payer: BC Managed Care – PPO | Admitting: Hematology

## 2021-12-22 ENCOUNTER — Other Ambulatory Visit: Payer: Self-pay

## 2021-12-22 VITALS — BP 111/78 | HR 93 | Temp 98.8°F | Resp 18 | Ht 63.0 in | Wt 194.7 lb

## 2021-12-22 DIAGNOSIS — D75839 Thrombocytosis, unspecified: Secondary | ICD-10-CM | POA: Diagnosis not present

## 2021-12-22 DIAGNOSIS — C786 Secondary malignant neoplasm of retroperitoneum and peritoneum: Secondary | ICD-10-CM | POA: Diagnosis not present

## 2021-12-22 DIAGNOSIS — F32A Depression, unspecified: Secondary | ICD-10-CM | POA: Diagnosis not present

## 2021-12-22 DIAGNOSIS — N3000 Acute cystitis without hematuria: Secondary | ICD-10-CM

## 2021-12-22 DIAGNOSIS — C187 Malignant neoplasm of sigmoid colon: Secondary | ICD-10-CM | POA: Diagnosis not present

## 2021-12-22 DIAGNOSIS — Z452 Encounter for adjustment and management of vascular access device: Secondary | ICD-10-CM

## 2021-12-22 DIAGNOSIS — Z5112 Encounter for antineoplastic immunotherapy: Secondary | ICD-10-CM | POA: Diagnosis not present

## 2021-12-22 DIAGNOSIS — F410 Panic disorder [episodic paroxysmal anxiety] without agoraphobia: Secondary | ICD-10-CM | POA: Diagnosis not present

## 2021-12-22 DIAGNOSIS — D649 Anemia, unspecified: Secondary | ICD-10-CM | POA: Diagnosis not present

## 2021-12-22 DIAGNOSIS — L02211 Cutaneous abscess of abdominal wall: Secondary | ICD-10-CM | POA: Diagnosis not present

## 2021-12-22 DIAGNOSIS — M2550 Pain in unspecified joint: Secondary | ICD-10-CM | POA: Diagnosis not present

## 2021-12-22 DIAGNOSIS — R300 Dysuria: Secondary | ICD-10-CM | POA: Diagnosis not present

## 2021-12-22 LAB — CBC WITH DIFFERENTIAL (CANCER CENTER ONLY)
Abs Immature Granulocytes: 0.03 10*3/uL (ref 0.00–0.07)
Basophils Absolute: 0 10*3/uL (ref 0.0–0.1)
Basophils Relative: 0 %
Eosinophils Absolute: 0.1 10*3/uL (ref 0.0–0.5)
Eosinophils Relative: 1 %
HCT: 27.7 % — ABNORMAL LOW (ref 36.0–46.0)
Hemoglobin: 8.7 g/dL — ABNORMAL LOW (ref 12.0–15.0)
Immature Granulocytes: 0 %
Lymphocytes Relative: 23 %
Lymphs Abs: 1.7 10*3/uL (ref 0.7–4.0)
MCH: 24.4 pg — ABNORMAL LOW (ref 26.0–34.0)
MCHC: 31.4 g/dL (ref 30.0–36.0)
MCV: 77.8 fL — ABNORMAL LOW (ref 80.0–100.0)
Monocytes Absolute: 0.4 10*3/uL (ref 0.1–1.0)
Monocytes Relative: 5 %
Neutro Abs: 5.3 10*3/uL (ref 1.7–7.7)
Neutrophils Relative %: 71 %
Platelet Count: 510 10*3/uL — ABNORMAL HIGH (ref 150–400)
RBC: 3.56 MIL/uL — ABNORMAL LOW (ref 3.87–5.11)
RDW: 17.6 % — ABNORMAL HIGH (ref 11.5–15.5)
WBC Count: 7.5 10*3/uL (ref 4.0–10.5)
nRBC: 0 % (ref 0.0–0.2)

## 2021-12-22 LAB — CMP (CANCER CENTER ONLY)
ALT: 5 U/L (ref 0–44)
AST: 8 U/L — ABNORMAL LOW (ref 15–41)
Albumin: 3.6 g/dL (ref 3.5–5.0)
Alkaline Phosphatase: 59 U/L (ref 38–126)
Anion gap: 8 (ref 5–15)
BUN: 8 mg/dL (ref 6–20)
CO2: 26 mmol/L (ref 22–32)
Calcium: 9.2 mg/dL (ref 8.9–10.3)
Chloride: 107 mmol/L (ref 98–111)
Creatinine: 0.54 mg/dL (ref 0.44–1.00)
GFR, Estimated: >60 mL/min (ref >60)
Glucose, Bld: 108 mg/dL — ABNORMAL HIGH (ref 70–99)
Potassium: 3.2 mmol/L — ABNORMAL LOW (ref 3.5–5.1)
Sodium: 141 mmol/L (ref 135–145)
Total Bilirubin: 0.3 mg/dL (ref 0.3–1.2)
Total Protein: 7.1 g/dL (ref 6.5–8.1)

## 2021-12-22 LAB — IRON AND IRON BINDING CAPACITY (CC-WL,HP ONLY)
Iron: 29 ug/dL (ref 28–170)
Saturation Ratios: 13 % (ref 10.4–31.8)
TIBC: 230 ug/dL — ABNORMAL LOW (ref 250–450)
UIBC: 201 ug/dL (ref 148–442)

## 2021-12-22 LAB — URINALYSIS, COMPLETE (UACMP) WITH MICROSCOPIC
Bilirubin Urine: NEGATIVE
Glucose, UA: NEGATIVE mg/dL
Hgb urine dipstick: NEGATIVE
Ketones, ur: NEGATIVE mg/dL
Nitrite: NEGATIVE
Protein, ur: NEGATIVE mg/dL
Specific Gravity, Urine: 1.021 (ref 1.005–1.030)
WBC, UA: >50 WBC/hpf — ABNORMAL HIGH (ref 0–5)
pH: 5 (ref 5.0–8.0)

## 2021-12-22 LAB — PREGNANCY, URINE: Preg Test, Ur: NEGATIVE

## 2021-12-22 LAB — CEA (IN HOUSE-CHCC): CEA (CHCC-In House): <1 ng/mL (ref 0.00–5.00)

## 2021-12-22 LAB — TSH: TSH: 1.987 u[IU]/mL (ref 0.350–4.500)

## 2021-12-22 LAB — FERRITIN: Ferritin: 111 ng/mL (ref 11–307)

## 2021-12-22 MED ORDER — SODIUM CHLORIDE 0.9 % IV SOLN: Freq: Once | INTRAVENOUS | Status: AC

## 2021-12-22 MED ORDER — SODIUM CHLORIDE 0.9 % IV SOLN
200.0000 mg | Freq: Once | INTRAVENOUS | Status: AC
  Administered 2021-12-22: 200 mg via INTRAVENOUS
  Filled 2021-12-22: qty 200

## 2021-12-22 MED ORDER — SODIUM CHLORIDE 0.9% FLUSH
10.0000 mL | INTRAVENOUS | Status: DC | PRN
  Administered 2021-12-22: 10 mL

## 2021-12-22 MED ORDER — HEPARIN SOD (PORK) LOCK FLUSH 100 UNIT/ML IV SOLN
500.0000 [IU] | Freq: Once | INTRAVENOUS | Status: AC | PRN
  Administered 2021-12-22: 500 [IU]

## 2021-12-22 MED ORDER — SODIUM CHLORIDE 0.9% FLUSH
10.0000 mL | Freq: Once | INTRAVENOUS | Status: AC
  Administered 2021-12-22: 10 mL

## 2021-12-22 NOTE — Patient Instructions (Addendum)
[facility]  Discharge Instructions: Thank you for choosing [facility] to provide your oncology and hematology care.   If you have a lab appointment with the [facility], please go directly to the [facility] and check in at the registration area.   Wear comfortable clothing and clothing appropriate for easy access to any Portacath or PICC line.   We strive to give you quality time with your provider. You may need to reschedule your appointment if you arrive late (15 or more minutes).  Arriving late affects you and other patients whose appointments are after yours.  Also, if you miss three or more appointments without notifying the office, you may be dismissed from the clinic at the provider's discretion.      For prescription refill requests, have your pharmacy contact our office and allow 72 hours for refills to be completed.    Today you received the following chemotherapy and/or immunotherapy agents: pembrolizumab (Keytruda).      To help prevent nausea and vomiting after your treatment, we encourage you to take your nausea medication as directed.  BELOW ARE SYMPTOMS THAT SHOULD BE REPORTED IMMEDIATELY: *FEVER GREATER THAN 100.4 F (38 C) OR HIGHER *CHILLS OR SWEATING *NAUSEA AND VOMITING THAT IS NOT CONTROLLED WITH YOUR NAUSEA MEDICATION *UNUSUAL SHORTNESS OF BREATH *UNUSUAL BRUISING OR BLEEDING *URINARY PROBLEMS (pain or burning when urinating, or frequent urination) *BOWEL PROBLEMS (unusual diarrhea, constipation, pain near the anus) TENDERNESS IN MOUTH AND THROAT WITH OR WITHOUT PRESENCE OF ULCERS (sore throat, sores in mouth, or a toothache) UNUSUAL RASH, SWELLING OR PAIN  UNUSUAL VAGINAL DISCHARGE OR ITCHING   Items with * indicate a potential emergency and should be followed up as soon as possible or go to the Emergency Department if any problems should occur.  Please show the CHEMOTHERAPY ALERT CARD or IMMUNOTHERAPY ALERT CARD  at check-in to the Emergency Department and triage nurse.  Should you have questions after your visit or need to cancel or reschedule your appointment, please contact [facility]  Dept: [phone]  and follow the prompts.  Office hours are 8:00 a.m. to 4:30 p.m. Monday - Friday. Please note that voicemails left after 4:00 p.m. may not be returned until the following business day.  We are closed weekends and major holidays. You have access to a nurse at all times for urgent questions. Please call the main number to the clinic Dept: [phone] and follow the prompts.   For any non-urgent questions, you may also contact your provider using MyChart. We now offer e-Visits for anyone 2 and older to request care online for non-urgent symptoms. For details visit [email or web address].   Also download the MyChart app! Go to the app store, search "MyChart", open the app, select [facility], and log in with your MyChart username and password.  Masks are optional in the cancer centers. If you would like for your care team to wear a mask while they are taking care of you, please let them know. For doctor visits, patients may have with them one support person who is at least 38 years old. At this time, visitors are not allowed in the infusion area. Pembrolizumab injection What is this medication? PEMBROLIZUMAB (pem broe liz ue mab) is a monoclonal antibody. It is used to treat certain types of cancer. This medicine may be used for other purposes; ask your health care provider or pharmacist if you have questions. COMMON BRAND NAME(S): Keytruda What should  I tell my care team before I take this medication? They need to know if you have any of these conditions: autoimmune diseases like Crohn's disease, ulcerative colitis, or lupus have had or planning to have an allogeneic stem cell transplant (uses someone else's stem cells) history of organ transplant history of chest  radiation nervous system problems like myasthenia gravis or Guillain-Barre syndrome an unusual or allergic reaction to pembrolizumab, other medicines, foods, dyes, or preservatives pregnant or trying to get pregnant breast-feeding How should I use this medication? This medicine is for infusion into a vein. It is given by a health care professional in a hospital or clinic setting. A special MedGuide will be given to you before each treatment. Be sure to read this information carefully each time. Talk to your pediatrician regarding the use of this medicine in children. While this drug may be prescribed for children as young as 6 months for selected conditions, precautions do apply. Overdosage: If you think you have taken too much of this medicine contact a poison control center or emergency room at once. NOTE: This medicine is only for you. Do not share this medicine with others. What if I miss a dose? It is important not to miss your dose. Call your doctor or health care professional if you are unable to keep an appointment. What may interact with this medication? Interactions have not been studied. This list may not describe all possible interactions. Give your health care provider a list of all the medicines, herbs, non-prescription drugs, or dietary supplements you use. Also tell them if you smoke, drink alcohol, or use illegal drugs. Some items may interact with your medicine. What should I watch for while using this medication? Your condition will be monitored carefully while you are receiving this medicine. You may need blood work done while you are taking this medicine. Do not become pregnant while taking this medicine or for 4 months after stopping it. Women should inform their doctor if they wish to become pregnant or think they might be pregnant. There is a potential for serious side effects to an unborn child. Talk to your health care professional or pharmacist for more information. Do  not breast-feed an infant while taking this medicine or for 4 months after the last dose. What side effects may I notice from receiving this medication? Side effects that you should report to your doctor or health care professional as soon as possible: allergic reactions like skin rash, itching or hives, swelling of the face, lips, or tongue bloody or black, tarry breathing problems changes in vision chest pain chills confusion constipation cough diarrhea dizziness or feeling faint or lightheaded fast or irregular heartbeat fever flushing joint pain low blood counts - this medicine may decrease the number of white blood cells, red blood cells and platelets. You may be at increased risk for infections and bleeding. muscle pain muscle weakness pain, tingling, numbness in the hands or feet persistent headache redness, blistering, peeling or loosening of the skin, including inside the mouth signs and symptoms of high blood sugar such as dizziness; dry mouth; dry skin; fruity breath; nausea; stomach pain; increased hunger or thirst; increased urination signs and symptoms of kidney injury like trouble passing urine or change in the amount of urine signs and symptoms of liver injury like dark urine, light-colored stools, loss of appetite, nausea, right upper belly pain, yellowing of the eyes or skin sweating swollen lymph nodes weight loss Side effects that usually do not require medical attention (  report to your doctor or health care professional if they continue or are bothersome): decreased appetite hair loss tiredness This list may not describe all possible side effects. Call your doctor for medical advice about side effects. You may report side effects to FDA at 1-800-FDA-1088. Where should I keep my medication? This drug is given in a hospital or clinic and will not be stored at home. NOTE: This sheet is a summary. It may not cover all possible information. If you have questions  about this medicine, talk to your doctor, pharmacist, or health care provider.  2023 Elsevier/Gold Standard (2021-04-24 00:00:00)

## 2021-12-22 NOTE — Progress Notes (Signed)
[place]   Telephone:([phone]) [phone] [phone]   Clinic Follow up Note   Patient Care Team: [name], MD as PCP - General (Internal Medicine) [name], MD as PCP - Cardiology (Cardiology) [name], [name] as Physician Assistant (Emergency Medicine) [name], MD as Consulting Physician (Oncology) [name], RN as Oncology Nurse Navigator (Oncology) [name], MD as Consulting Physician (General Surgery) [name], [name], MD as Consulting Physician (Neurology) [name], [name], MD as Consulting Physician (Obstetrics and Gynecology) [name], MD as Consulting Physician (Gastroenterology) [name], [name], NP as Nurse Practitioner (Nurse Practitioner)  Date of Service:  12/22/2021  CHIEF COMPLAINT: f/u of sigmoid colon cancer  CURRENT THERAPY:  Keytruda, q21d, started 09/08/21  ASSESSMENT & PLAN:  SHANEAL Rios is a 36 y.o. female with   1. 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 She continued to have abdominal pain and drainage, undergoing drain exchange three times. -she developed worsening weakness and was admitted on 04/10/21. She was taken for emergent small bowel and left colon resection on 04/15/21 under Dr. Johney Maine. 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 Margins negative for invasive carcinoma, but low-grade dysplasia involves proximal margin. All 5 lymph nodes negative (0/5). MSI low. -repeat CT AP on 06/16/21 showed progressive metastatic disease involving soft tissue masses in left psoas muscle and left lateral abdominal wall and peritoneal masses, and new mild  retroperitoneal lymphadenopathy.  -baseline CEA on 06/22/21 was elevated at 87.41. -she started FOLFOX on 06/22/21 but did not respond -she switched to [facility] on 09/08/21. She has been tolerating well overall but has developed joint pain which could be related, overall manageable. She is clinically doing better also with more energy and less abdominal pain   -CT CAP on 10/07/21 in the ED showed overall stable disease. Plan to repeat CT scan in August. -she continues to tolerate treatment well. She does have some new dysuria, will order UA and culture today. Labs reviewed, overall stable, will continue Keytruda  2. Diffuse arthralgia -She has developed diffuse arthralgia in her arms and legs since May 2023, probably related to [facility].  -she is on higher dose cymbalta -she continues with physical therapy. We also discussed other options, such as dry needling, for relief.    3. 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 -patient reports struggling with some constipation with taking iron pills in the past. Will give IV iron as needed to help bolster iron levels.  -hgb stable at 8.7, plt 510k today (12/22/21)   4. Abscess of left flank: -developed after third cycle Keytruda. -was given 7-day course of doxycycline starting 11/10/21 -today (12/22/21), she reports she is changing the dressing once a day.   5. Anxiety and depression -Due to the prolonged illness and multiple hospital stay since May 2022, and cancer diagnosis -cymbalta prescribed 05/25/21 -She had panic attack 07/20/21 after chemo, when she was reacting to clorhexidine.  -F/up Dr. Michail Sermon   6. Genetics, FAP -FO showed MSI-High disease, and showed multiple other mutations including APC, [name]6, and BRCA2 mutations.  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  -I indicated her children should seek testing, when appropriate. Her 47 yo son is eligible now, and possibly her 6 yo daughter who is mature. 7 yo child may be too young    7. Social Support -she is connected with [employer], [employer] -she is working to apply for disability. Her job ended just prior to the start of her symptoms. -she has three children-- ages 43, 11, and 54.    PLAN: -proceed with Cycle 6 of Keytruda today  -UA and culture to be done today -lab, flush, f/u, and Keytruda in 3 and 6 weeks -restaging CT in 3 weeks    No problem-specific Assessment & Plan notes found for this encounter.   SUMMARY OF ONCOLOGIC HISTORY: Oncology History Overview Note   Cancer Staging  Malignant neoplasm of sigmoid colon [facility]) Staging form: Colon and Rectum, AJCC 8th Edition - Pathologic stage from 04/15/2021: Stage IIC (pT4b, pN0, cM0) - Signed by [name], MD on 06/12/2021    Malignant neoplasm of sigmoid colon [facility])   Initial Diagnosis   Malignant neoplasm of sigmoid colon ([place])   11/04/2020 Imaging   EXAM: CT ABDOMEN AND PELVIS WITHOUT CONTRAST  IMPRESSION: 1. Perforating descending colonic diverticulitis with multiple abdominopelvic gas and fluid collections, measuring up to 5.7 cm and further described above. 2. Cholelithiasis without findings of acute cholecystitis. 3. 3.6 cm benign left adrenal adenoma. 4. Leiomyomatous uterus. 5. 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   11/07/2020 Imaging   EXAM: CT ABDOMEN AND PELVIS WITHOUT CONTRAST  IMPRESSION: Continued wall thickening is seen involving descending colon suggesting infectious or inflammatory colitis or perforated diverticulitis. There is an adjacent fluid collection measuring 7.0 x 5.0 cm consistent with abscess which is significantly enlarged compared to prior exam. Adjacent to the  abscess, there is a severely thickened small bowel loop most consistent with secondary inflammation.   5.2 x 3.5 cm fluid collection is noted within the left psoas muscle consistent with abscess which is significantly enlarged compared to prior exam. 4.4 x 3.8 cm fluid collection consistent with abscess is noted in the left retroperitoneal region which is also enlarged compared to prior exam.   Fibroid uterus.   Cholelithiasis.   11/27/2020 Imaging   EXAM: CT ABDOMEN AND PELVIS WITHOUT CONTRAST  IMPRESSION: 1. Significantly interval decreased size of the previously visualized left retroperitoneal and left anterior mid abdominal fluid collections. There is persistent fat stranding and mural thickening of the mid descending colon at this level. 2. Unchanged leiomyomatous uterus. 3. Unchanged cholelithiasis.   12/11/2020 Imaging   EXAM: CT ABDOMEN AND PELVIS WITHOUT CONTRAST  IMPRESSION: 1. Stable inflammatory changes centered around the distal descending colon in the left lower abdomen. Pericolonic inflammatory changes have minimally changed since 11/27/2020. Small pocket of gas medial to the colon is probably associated with a fistula or small residual abscess collection. 2. Stable position of the two percutaneous drains. The more anterior drain may be extending through a portion of the small bowel. 3. Cholelithiasis. 4. Fibroid uterus. Cannot exclude an ovarian/adnexal cystic structure near the uterine fundus. 5. Left adrenal adenoma.   01/07/2021 Imaging   EXAM: CT ABDOMEN AND PELVIS WITH CONTRAST  IMPRESSION: 1. No new abscesses identified. Similar degree of soft tissue thickening seen in the left pericolonic region. The degree of persistent soft tissues thickening in the pericolonic space further raises  suspicions for malignancy. Further evaluation with colonoscopy should be performed. 2. Left retroperitoneal abscess drain unchanged in position. Anterior left abdominal  drain again seen terminating within small bowel loop. 3. 2.6 cm mildly sclerotic lesion noted in the L2 vertebral body. Further evaluation with contrast enhanced lumbar spine MRI should be performed.   02/04/2021 Imaging   EXAM: CT ABDOMEN AND PELVIS WITH CONTRAST  IMPRESSION: No new abdominopelvic collections or abscess development in the 1 month interval.   Left anterior drain remains within a loop of small bowel, unchanged.   Left lateral abscess drain remains in the retroperitoneal space adjacent to the iliopsoas muscle with a small amount of residual fluid and air but no measurable collection.   Stable soft tissue prominence and pericolonic strandy edema/inflammation about the left descending colon compatible with residual diverticulitis/colitis. Difficult to exclude underlying transmural lesion.   04/01/2021 Imaging   EXAM: CT ABDOMEN AND PELVIS WITH CONTRAST  IMPRESSION: There appears to be significant enhancement and wall thickening involving the descending colon with some degree of traction and involvement of adjacent small bowel loops. This is consistent with the history of diverticulitis and perforation. Stable position of percutaneous drainage catheter is seen adjacent to left psoas muscle with no significant residual fluid remaining. The other percutaneous drainage catheter that was previously noted to be within small bowel loop on prior exam in the left lower quadrant, has significantly retracted and appears to be outside of the peritoneal space at this time.   Since the prior exam, there does appear to be some degree of rotation involving mesenteric vessels and structures in the right lower quadrant, suggesting partial volvulus or malrotation. There is the interval development of several lymph nodes in this area, most likely inflammatory or reactive in etiology. Mild amount of free fluid is also noted in the pelvis. However, no significant bowel wall thickening or  dilatation is seen in this area. These results will be called to the ordering clinician or representative by the Radiologist Assistant, and communication documented in the PACS or zVision Dashboard.   Stable uterine fibroid.   Hepatic steatosis.   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   Cholelithiasis.   04/10/2021 Imaging   EXAM: CT ANGIOGRAPHY CHEST CT ABDOMEN AND PELVIS WITH CONTRAST  IMPRESSION: 1. Again seen are findings compatible with descending colon diverticulitis with perforation. Free air and inflammation have increased in the interval. 2. New lobulated enhancing fluid collection posterior to this segment of inflamed colon measuring 8.5 x 3.5 x 10.0 cm. This collection now invades the adjacent iliopsoas muscle as well as extends through the left lateral abdominal wall. The tip of the drainage catheter is in this collection. Findings are compatible with abscess. 3. Anterior left percutaneous drainage catheter tip has been pulled back and is now within the subcutaneous tissues. 4. Trace free fluid. 5. No pulmonary embolism.  No acute cardiopulmonary process. 6. Cholelithiasis. 7. Fatty infiltration of the liver.   04/15/2021 Definitive Surgery   FINAL MICROSCOPIC DIAGNOSIS:   A. SMALL BOWEL, RESECTION:  - Adenocarcinoma.  - No carcinoma identified in 1 lymph node.   B. PERFORATED LEFT COLON, RESECTION:  - Moderately differentiated colonic adenocarcinoma.  - Tumor extends into pericolonic adipose tissue, and is strongly  suggestive of invasion into small bowel.  See oncology table/comments.  - No carcinoma identified in 4 lymph nodes (0/4).  - Tubular adenoma with high-grade dysplasia, 1.  - Tubular adenomas with low grade dysplasia, 3.   Comments: The size of the  tumor is difficult to estimate secondary to the disrupted nature of the specimen, as well as the infiltrative nature of the tumor.  Tumor can be identified as definitely invading into the pericolonic  adipose tissue (block B4), but given the extreme disruption of the tissue, and the involvement of the small bowel by what appears to be a colonic adenocarcinoma, I favor perforation of the large bowel with direct invasion into the small bowel.  Accordingly, I believe this is best regarded as a pT4b lesion.   ADDENDUM:  Mismatch Repair Protein (IHC)  SUMMARY INTERPRETATION: ABNORMAL  There is loss of the major MMR protein [name]6: This indicates a high probability that a hereditary germline mutation is present and referral to genetic counseling is warranted. It is recommended that the loss of protein expression be correlated with molecular based microsatellite instability testing.   IHC EXPRESSION RESULTS  TEST           RESULT  MLH1:          Preserved nuclear expression  [name]2:          Preserved nuclear expression  [name]6:          LOSS OF NUCLEAR EXPRESSION  PMS2:          Preserved nuclear expression    04/15/2021 Cancer Staging   Staging form: Colon and Rectum, AJCC 8th Edition - Pathologic stage from 04/15/2021: Stage IIC (pT4b, pN0, cM0) - Signed by [name], MD on 06/12/2021 Stage prefix: Initial diagnosis Total positive nodes: 0 Histologic grading system: 4 grade system Histologic grade (G): G2 Residual tumor (R): R0 - None   04/29/2021 Imaging   EXAM: CT ABDOMEN AND PELVIS WITH CONTRAST  IMPRESSION: Continued left retroperitoneal abscesses inferior to the left kidney, involving the left psoas muscle and left abdominal wall musculature. Overall size is decreased since prior study. Interval removal of left lower quadrant abscess drainage catheter.   New fluid collection in the cul-de-sac and wrapping around the uterus concerning for abscess.   Cholelithiasis.   Small left pleural effusion.  Bibasilar atelectasis.   Stable left adrenal adenoma.  ADDENDUM: After discussing the case with Dr. Dwaine Gale in interventional radiology, it was noted that the fluid in the pelvis originally  thought to be in the cul-de-sac is likely within the vagina, best seen on sagittal imaging. Recommend speculum exam.   Also, in the left lateral wall in the area of prior abscess in the lateral wall abdominal musculature, some of the abdomen soft tissue appears to be enhancing. While this could be infectious, cannot exclude tumor seeding in the left lateral abdominal wall, measuring 6.9 x 3.8 cm on image 64 of series 2.   06/16/2021 Imaging   EXAM: CT ABDOMEN AND PELVIS WITH CONTRAST  IMPRESSION: Increased size of bulky soft tissue masses involving the left psoas muscle and left lateral abdominal wall soft tissues, consistent with progressive metastatic disease.   Significant progression of multiple peritoneal masses throughout the abdomen and pelvis, consistent with peritoneal carcinomatosis.   New mild retroperitoneal lymphadenopathy, consistent with metastatic disease.   Stable uterine fibroid and left adrenal adenoma.   Cholelithiasis, without evidence of cholecystitis.   06/22/2021 - 08/19/2021 Chemotherapy   Patient is on Treatment Plan : COLORECTAL FOLFOX q14d x 6 months     06/22/2021 Tumor Marker   Patient's tumor was tested for the following markers: CEA. Results of the tumor marker test revealed 87.41.   07/10/2021 Pathology Results   FINAL MICROSCOPIC DIAGNOSIS:  A. PERITONEAL MASS, LEFT UPPER ABDOMINAL QUADRANT, BIOPSY:  -  Metastatic adenocarcinoma with necrosis, histologically consistent with colon primary.     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 The report date is 07/16/2021. The remainder of testing was negative/normal.  The CancerNext-Expanded + RNAinsight gene panel offered by [employer] and includes sequencing and rearrangement analysis for the following 77 genes: IP, ALK, APC*, ATM*, AXIN2, BAP1, BARD1, BLM, BMPR1A, BRCA1*, BRCA2*, BRIP1*, CDC73, CDH1*,CDK4, CDKN1B, CDKN2A,  CHEK2*, CTNNA1, DICER1, FANCC, FH, FLCN, GALNT12, KIF1B, LZTR1, MAX, MEN1, MET, MLH1*, [name]2*, [name]3, [name]6*, MUTYH*, NBN, NF1*, NF2, NTHL1, PALB2*, PHOX2B, PMS2*, POT1, PRKAR1A, PTCH1, PTEN*, RAD51C*, RAD51D*,RB1, RECQL, RET, SDHA, SDHAF2, SDHB, SDHC, SDHD, SMAD4, SMARCA4, SMARCB1, SMARCE1, STK11, SUFU, TMEM127, TP53*,TSC1, TSC2, VHL and XRCC2 (sequencing and deletion/duplication); EGFR, EGLN1, HOXB13, KIT, MITF, PDGFRA, POLD1 and POLE (sequencing only); EPCAM and GREM1 (deletion/duplication only).   08/28/2021 Imaging   EXAM: CT CHEST, ABDOMEN, AND PELVIS WITH CONTRAST  IMPRESSION: 1. Continued interval progression of multiple metastatic soft tissue nodules/masses in the abdomen and pelvis. 2. No evidence for metastatic disease in the chest. 3. Similar appearance of the subtle lesion in the L2 vertebral body, suspicious for metastatic involvement. 4. Cholelithiasis.   09/08/2021 -  Chemotherapy   Patient is on Treatment Plan : COLORECTAL Pembrolizumab (200) q21d        INTERVAL HISTORY:  ARIANN KHAIMOV is here for a follow up of colon cancer. She was last seen by me on 12/01/21. She presents to the clinic accompanied by her husband. She reports her most bothersome symptom is the whole-body arthralgia. She notes her abdominal pain is much less, and she is now changing the dressing once a day. She also reports some new dysuria, specifically at the end of urination. She notes it has been going on for about a week, but she has been trying at-home remedies (such as cranberry juice).   All other systems were reviewed with the patient and are negative.  MEDICAL HISTORY:  Past Medical History:  Diagnosis Date   Allergy    seasonal   Anemia    Blood infection 1985   Blood transfusion without reported diagnosis    Diverticulitis    Family history of breast cancer    Family history of colon cancer    Family history of stomach cancer    Hypertension    Obesity    Sleep apnea     SURGICAL  HISTORY: Past Surgical History:  Procedure Laterality Date   COLECTOMY WITH COLOSTOMY CREATION/HARTMANN PROCEDURE N/A 04/15/2021   Procedure: COLOSTOMY CREATION/HARTMANN PROCEDURE;  Surgeon: [name], MD;  Location: [facility];  Service: General;  Laterality: N/A;   IR CATHETER TUBE CHANGE  12/12/2020   IR CATHETER TUBE CHANGE  01/27/2021   IR CATHETER TUBE CHANGE  02/19/2021   IR CATHETER TUBE CHANGE  04/13/2021   IR IMAGING GUIDED PORT INSERTION  10/02/2021   IR RADIOLOGIST EVAL & MGMT  11/27/2020   IR RADIOLOGIST EVAL & MGMT  12/11/2020   IR RADIOLOGIST EVAL & MGMT  01/07/2021   IR RADIOLOGIST EVAL & MGMT  02/04/2021   IR SINUS/FIST TUBE CHK-NON GI  12/12/2020   IR SINUS/FIST TUBE CHK-NON GI  02/19/2021   LAPAROSCOPIC PARTIAL COLECTOMY N/A 04/15/2021   Procedure: LAPAROSCOPIC ASSISTED HARTMANN RESECTION;  Surgeon: [name], MD;  Location: [facility];  Service: General;  Laterality: N/A;    I have reviewed the social history and  family history with the patient and they are unchanged from previous note.  ALLERGIES:  is allergic to chlorhexidine gluconate, shellfish allergy, and penicillins.  MEDICATIONS:  Current Outpatient Medications  Medication Sig Dispense Refill   amLODipine (NORVASC) 10 MG tablet Take 10 mg by mouth every evening.     DULoxetine (CYMBALTA) 60 MG capsule TAKE 1 CAPSULE BY MOUTH EVERY DAY 90 capsule 2   HYDROmorphone (DILAUDID) 2 MG tablet Take 1 tablet (2 mg total) by mouth every 6 (six) hours as needed for severe pain. 30 tablet 0   metoprolol tartrate 75 MG TABS Take 75 mg by mouth 2 (two) times daily. 90 tablet 3   potassium chloride 20 MEQ/15ML (10%) SOLN Take 45 mLs (60 mEq total) by mouth daily. 473 mL 3   POTASSIUM PO Take by mouth.     No current facility-administered medications for this visit.   Facility-Administered Medications Ordered in Other Visits  Medication Dose Route Frequency Provider Last Rate Last Admin   fentaNYL (SUBLIMAZE) injection    Intravenous PRN Mir, Paula Libra, MD   50 mcg at 10/02/21 1436   midazolam (VERSED) injection   Intravenous PRN Mir, Paula Libra, MD   1 mg at 10/02/21 1436    PHYSICAL EXAMINATION: ECOG PERFORMANCE STATUS: 2 - Symptomatic, <50% confined to bed  Vitals:   12/22/21 0832  BP: 111/78  Pulse: 93  Resp: 18  Temp: 98.8 F (37.1 C)  SpO2: 100%   Wt Readings from Last 3 Encounters:  12/22/21 194 lb 11.2 oz (88.3 kg)  12/01/21 189 lb 8 oz (86 kg)  11/25/21 192 lb 12.8 oz (87.5 kg)     GENERAL:alert, no distress and comfortable SKIN: skin color normal, no rashes or significant lesions EYES: normal, Conjunctiva are pink and non-injected, sclera clear  NEURO: alert & oriented x 3 with fluent speech  LABORATORY DATA:  I have reviewed the data as listed    Latest Ref Rng & Units 12/22/2021    8:11 AM 12/01/2021    8:12 AM 11/10/2021   12:52 PM  CBC  WBC 4.0 - 10.5 K/uL 7.5  7.0  7.2   Hemoglobin 12.0 - 15.0 g/dL 8.7  9.1  8.2   Hematocrit 36.0 - 46.0 % 27.7  29.7  27.2   Platelets 150 - 400 K/uL 510  500  667         Latest Ref Rng & Units 12/01/2021    8:12 AM 11/10/2021   12:52 PM 10/20/2021   11:00 AM  CMP  Glucose 70 - 99 mg/dL 105  91  98   BUN 6 - 20 mg/dL 10  16  13    Creatinine 0.44 - 1.00 mg/dL 0.54  0.60  0.55   Sodium 135 - 145 mmol/L 140  140  143   Potassium 3.5 - 5.1 mmol/L 3.0  3.6  3.0   Chloride 98 - 111 mmol/L 105  106  111   CO2 22 - 32 mmol/L 27  29  27    Calcium 8.9 - 10.3 mg/dL 9.1  9.3  8.2   Total Protein 6.5 - 8.1 g/dL 7.2  7.0  6.4   Total Bilirubin 0.3 - 1.2 mg/dL 0.3  0.2  0.2   Alkaline Phos 38 - 126 U/L 48  46  42   AST 15 - 41 U/L 9  8  8    ALT 0 - 44 U/L 6  5  6        RADIOGRAPHIC  STUDIES: I have personally reviewed the radiological images as listed and agreed with the findings in the report. No results found.    Orders Placed This Encounter  Procedures   Urine Culture    Standing Status:   Future    Standing Expiration Date:   12/23/2022    CT CHEST ABDOMEN PELVIS W CONTRAST    Standing Status:   Future    Standing Expiration Date:   12/23/2022    Order Specific Question:   Is patient pregnant?    Answer:   No    Order Specific Question:   Preferred [facility]    [facility]    Order Specific Question:   Is Oral Contrast requested for this exam?    Answer:   Yes, Per Radiology protocol   Urinalysis, Complete w Microscopic    Standing Status:   Future    Standing Expiration Date:   12/23/2022   All questions were answered. The patient knows to call the clinic with any problems, questions or concerns. No barriers to learning was detected. The total time spent in the appointment was 30 minutes.     [name], MD 12/22/2021   I, Wilburn Mylar, am acting as scribe for [name], MD.   I have reviewed the above documentation for accuracy and completeness, and I agree with the above.

## 2021-12-23 ENCOUNTER — Other Ambulatory Visit: Payer: Self-pay | Admitting: Hematology

## 2021-12-23 ENCOUNTER — Telehealth: Payer: Self-pay | Admitting: Hematology

## 2021-12-23 ENCOUNTER — Telehealth: Payer: Self-pay

## 2021-12-23 ENCOUNTER — Other Ambulatory Visit (HOSPITAL_COMMUNITY): Payer: Self-pay

## 2021-12-23 ENCOUNTER — Encounter: Payer: Self-pay | Admitting: Hematology

## 2021-12-23 LAB — URINE CULTURE

## 2021-12-23 LAB — T4: T4, Total: 6.7 ug/dL (ref 4.5–12.0)

## 2021-12-23 MED ORDER — NITROFURANTOIN MONOHYD MACRO 100 MG PO CAPS
100.0000 mg | ORAL_CAPSULE | Freq: Two times a day (BID) | ORAL | 0 refills | Status: DC
  Filled 2021-12-23: qty 10, 5d supply, fill #0

## 2021-12-23 NOTE — Telephone Encounter (Signed)
Scheduled follow-up appointment per 7/18 los. Patient is aware.

## 2021-12-23 NOTE — Telephone Encounter (Signed)
Pt called stating that her UA/UC results have resulted in MyChart and she has a few questions regarding her results.  Pt wants to know if Dr. Burr Medico is going to prescribe her antibiotics.  Notified Dr. Burr Medico.

## 2021-12-27 NOTE — Progress Notes (Unsigned)
Cardiology Office Note:    Date:  12/31/2021   ID:  Mikayla Rios, DOB December 29, 1985, MRN [id number]  PCP:  [name], MD  Cardiologist:  [name], MD  Electrophysiologist:  None   Referring MD: [name], MD   Chief Complaint:  follow-up of palpitations/ tachycardia  History of Present Illness:    Mikayla Rios is a 36 y.o. female with a history of palpitations/tachycardia with no significant arrhythmias noted on monitor in 11/2019 hypertension, obstructive sleep apnea, perforated diverticulitis with retroperitoneal abscess s/p colectomy and end colostomy in 04/2021, colon cancer, and obesity who is followed by Dr. Nechama Guard and presents today for routine follow-up.  Patient was referred to Dr. Gardiner Rhyme in 10/2019 for further evaluation of tachycardia with episodes of what patient described as heart racing a couple times per day.  She also reported some dyspnea with exertion at that time.  Echo and ZIO monitor were ordered for further evaluation.  Echo showed LVEF of 35% with mild LVH but no regional wall motion abnormalities and normal diastolic parameters.  ZIO monitor showed rare PAC/PVCs but no significant rhythm.  Patient was last seen by Dr. Gardiner Rhyme in 12/2019 at which time she was doing well.  She was only having rare palpitations at that time and she denies any chest pain or dyspnea.  Since last visit, she has had a couple of hospitalizations for noncardiac issues.  She was admitted in 11/2020 for perforated diverticulitis with abscess at which time she had a drain placed. She had a prolonged hospitalization in 04/2021 after being admitted with septic shock secondary to chronic perforated colon with retroperitoneal abscess and partial small bowel obstruction.  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 Echo during this admission showed LVEF of 60-65% with no regional wall motion abnormalities and no  significant valvular disease.  Hospitalization was complicated by acute respiratory failure requiring intubation and acute blood loss anemia requiring multiple blood transfusions. She was discharged to CIR after this.   She was most recently admitted from 10/06/2021 to 10/11/2021 for SIRS after presenting with complaints of fevers, chills, palpitations, and malaise. Upon arrival to the ED, she was noted to be tachycardic and tachypneic.  However, work-up was unremarkable.  WBC, lactic acid, and blood cultures were normal.  Urinalysis was not suggestive of infection and chest x-ray and CT showed no evidence of pneumonia.  Abdominal/pelvic CT was also negative for any acute infectious process.  High-sensitivity troponin was minimally elevated and flat consistent with demand ischemia.  She was initially treated with IV antibiotics but these were ultimately stopped given no source of infection.  She was also treated with IV fluids.  She remained tachycardic and it was felt this was likely multifactorial. She has chronic anemia which was felt to be playing a role in her tachycardia so she was transferred  unit of PRBCs and Lopressor was increased.  Patient presents today for follow-up. Here with husband. Patient is doing well from a cardiac standpoint. She has not had any problems with palpitations or elevated heart rate since her Lopressor was increased during most recent hospitalization. She denies any chest pain, shortness of breath, orthopnea, PND, or lower extremity edema. She does report occasional episodes of dizziness and near syncope. She estimates that this occurs about once ever there week. It tends to occur when she has been up on her feet for a while. She associated ear ringing with this but denies  any overt syncope. One episode occurred after she got out of a hot shower. She checked her BP at that time and states it was low. She states her BP has also been running lower recently when physical therapy checks  this. She states she has not been sleeping well due to diffuse pain from fibromyalgia and her mind racing. She is no longer using her CPAP machine since she got sick about 2 years ago.  From a colon cancer standpoint, she is no longer on chemotherapy and is no on immunotherapy. She is scheduled to have a repeat scan to check on size of tumor soon. She has not had any radiation yet.    Past Medical History:  Diagnosis Date   Allergy    seasonal   Anemia    Blood infection 1985   Blood transfusion without reported diagnosis    Diverticulitis    Family history of breast cancer    Family history of colon cancer    Family history of stomach cancer    Hypertension    Obesity    Sleep apnea     Past Surgical History:  Procedure Laterality Date   COLECTOMY WITH COLOSTOMY CREATION/HARTMANN PROCEDURE N/A 04/15/2021   Procedure: COLOSTOMY CREATION/HARTMANN PROCEDURE;  Surgeon: [name], MD;  Location: [facility];  Service: General;  Laterality: N/A;   IR CATHETER TUBE CHANGE  12/12/2020   IR CATHETER TUBE CHANGE  01/27/2021   IR CATHETER TUBE CHANGE  02/19/2021   IR CATHETER TUBE CHANGE  04/13/2021   IR IMAGING GUIDED PORT INSERTION  10/02/2021   IR RADIOLOGIST EVAL & MGMT  11/27/2020   IR RADIOLOGIST EVAL & MGMT  12/11/2020   IR RADIOLOGIST EVAL & MGMT  01/07/2021   IR RADIOLOGIST EVAL & MGMT  02/04/2021   IR SINUS/FIST TUBE CHK-NON GI  12/12/2020   IR SINUS/FIST TUBE CHK-NON GI  02/19/2021   LAPAROSCOPIC PARTIAL COLECTOMY N/A 04/15/2021   Procedure: LAPAROSCOPIC ASSISTED HARTMANN RESECTION;  Surgeon: [name], MD;  Location: [facility];  Service: General;  Laterality: N/A;    Current Medications: Current Meds  Medication Sig   amLODipine (NORVASC) 10 MG tablet Take 10 mg by mouth every evening.   DULoxetine (CYMBALTA) 60 MG capsule TAKE 1 CAPSULE BY MOUTH EVERY DAY   HYDROmorphone (DILAUDID) 2 MG tablet Take 1 tablet (2 mg total) by mouth every 6 (six) hours as needed for severe  pain.   metoprolol tartrate 75 MG TABS Take 75 mg by mouth 2 (two) times daily.   potassium chloride 20 MEQ/15ML (10%) SOLN Take 45 mLs (60 mEq total) by mouth daily.   POTASSIUM PO Take by mouth.     Allergies:   Chlorhexidine gluconate, Shellfish allergy, and Penicillins   Social History   Socioeconomic History   Marital status: Married    Spouse name: Errol   Number of children: 3   Years of education: Not on file   Highest education level: Not on file  Occupational History   Not on file  Tobacco Use   Smoking status: Never   Smokeless tobacco: Never  Vaping Use   Vaping Use: Never used  Substance and Sexual Activity   Alcohol use: Not Currently   Drug use: No   Sexual activity: Yes    Partners: Male    Birth control/protection: None  Other Topics Concern   Not on file  Social History Narrative   Not on file   Social Determinants of Radio broadcast assistant  Strain: High Risk (05/26/2021)   Overall Financial Resource Strain (CARDIA)    Difficulty of Paying Living Expenses: Hard  Food Insecurity: Food Insecurity Present (05/26/2021)   Hunger Vital Sign    Worried About Running Out of Food in the Last Year: Sometimes true    Ran Out of Food in the Last Year: Sometimes true  Transportation Needs: No Transportation Needs (05/26/2021)   PRAPARE - Hydrologist (Medical): No    Lack of Transportation (Non-Medical): No  Physical Activity: Not on file  Stress: Stress Concern Present (05/26/2021)   [place]    Feeling of Stress : Very much  Social Connections: Moderately Integrated (05/26/2021)   Social Connection and Isolation Panel [NHANES]    Frequency of Communication with Friends and Family: More than three times a week    Frequency of Social Gatherings with Friends and Family: More than three times a week    Attends Religious Services: More than 4 times per year     Active Member of [employer] or Organizations: No    Attends Archivist Meetings: Never    Marital Status: Married     Family History: The patient's family history includes Autism in her son; Cancer in her mother, paternal grandfather, and paternal uncle; Colon cancer in her mother; Colon polyps in her half-sister; Congestive Heart Failure in her father; Diabetes in her maternal grandfather, maternal grandmother, mother, and paternal grandmother; Heart disease in her paternal aunt; Hypertension in her father, maternal grandmother, and mother; Kidney disease in her maternal grandfather; Stomach cancer in her maternal uncle.  ROS:   Please see the history of present illness.     EKGs/Labs/Other Studies Reviewed:    The following studies were reviewed today:  Zio Monitor 10/23/2019 to 10/26/2019: No significant arrhythmias detected   3 days of data recorded on Zio monitor. Patient had a min HR of 55 bpm, max HR of 132 bpm, and avg HR of 81 bpm. Predominant underlying rhythm was Sinus Rhythm. No VT, SVT, atrial fibrillation, high degree block, or pauses noted. Isolated atrial and ventricular ectopy was rare (<1%). There were 0 triggered events. No significant arrhythmias detected. _______________  Echocardiogram 04/14/2021: Impressions:  1. Left ventricular ejection fraction, by estimation, is 60 to 65%. The  left ventricle has normal function. The left ventricle has no regional  wall motion abnormalities. Indeterminate diastolic filling due to E-A  fusion.   2. Right ventricular systolic function is normal. The right ventricular  size is normal.   3. The mitral valve is normal in structure. No evidence of mitral valve  regurgitation. No evidence of mitral stenosis.   4. The aortic valve is normal in structure. Aortic valve regurgitation is  not visualized. No aortic stenosis is present.   5. The inferior vena cava is normal in size with greater than 50%  respiratory variability,  suggesting right atrial pressure of 3 mmHg.   EKG:  EKG ordered today. EKG personally reviewed and demonstrates normal sinus rhythm, rate 78 bpm, with no acute ST/T changes. Normal axis. Normal PR and QRS intervals. QTc 462 ms.  Recent Labs: 10/20/2021: Magnesium 1.2 12/22/2021: ALT 5; BUN 8; Creatinine 0.54; Hemoglobin 8.7; Platelet Count 510; Potassium 3.2; Sodium 141; TSH 1.987  Recent Lipid Panel    Component Value Date/Time   CHOL 155 10/02/2019 1127   TRIG 71 04/21/2021 0600   HDL 39 (L) 10/02/2019 1127   CHOLHDL 4.0  10/02/2019 1127   [place] 99 10/02/2019 1127    Physical Exam:    Vital Signs: BP 122/82   Pulse 78   Ht '5\' 3"'$  (1.6 m)   Wt 188 lb 6.4 oz (85.5 kg)   SpO2 99%   BMI 33.37 kg/m     Wt Readings from Last 3 Encounters:  12/31/21 188 lb 6.4 oz (85.5 kg)  12/22/21 194 lb 11.2 oz (88.3 kg)  12/01/21 189 lb 8 oz (86 kg)     General: 36 y.o. African-American female in no acute distress. HEENT: Normocephalic and atraumatic. Sclera clear.  Neck: Supple. No carotid bruits. No JVD. Heart: RRR. Distinct S1 and S2. No murmurs, gallops, or rubs.  Lungs: No increased work of breathing. Clear to ausculation bilaterally. No wheezes, rhonchi, or rales.  Abdomen: Soft, non-distended, and non-tender to palpation. Extremities: No lower extremity edema.    Skin: Warm and dry. Neuro: Alert and oriented x3. No focal deficits. Psych: Normal affect. Responds appropriately.  Assessment:    1. History of palpitations   2. History of sinus tachycardia   3. Primary hypertension   4. Dizziness   5. Near syncope   6. Coronary artery calcification   7. Obstructive sleep apnea   8. Sleep difficulties     Plan:    History of Palpitations/ Sinus Tachycardia Zio monitor in 10/2019 showed rare PACs/PVCs but no significant arrhythmias.  - Well controlled on higher dose of beta-blocker. - Continue Lopressor '75mg'$  twice daily.  Hypertension BP well controlled in the office today  but she reports low BP at times at home. - Continue current medications: Amlodipine '10mg'$  daily and Lopressor '75mg'$  twice daily. - Asked patient to keep a BP/HR log for me for 2 weeks and then send this to me via MyChart. If BP is consistently low, may need to decrease Amlodipine.  Dizziness/ Near Syncope Patient reports occasional episode of dizziness and near syncope. No palpitations with this. No overt syncope. Sounds like it is related to when her BP drops. - Asked patient to keep a BP/HR log for me for 2 weeks and then send this to me via MyChart. If BP is consistently low, may need to decrease Amlodipine. - Patient to notify us if she has worsening episodes of dizziness or any syncopal episodes. May need to consider repeat monitor if this happens.  Coronary Calcifications CT during hospitalization for SIRS in 10/2021 mentioned mild coronary calcifications. High-sensitivity was minimally elevated at that time consistent with demand ischemia.  - No ischemic changes on EKG today. - She denies any chest pain or anginal equivalent symptoms. - No known family of CAD. Offered coronary calcium score for further risk stratification. Patient states she would think about it and let us know.   Obstructive Sleep Apnea Sleep Difficulties  No longer using CPAP. She states she does not sleep well due to diffuse pain from fibromyalgia and her mind racing. - Discussed ways to improve sleep like avoiding electronics before bed, increasing physical activity, and avoiding napping during the day. - She also may benefit from a repeat sleep study. CPAP was previously managed by [facility]. Recommend discussing with them.   Disposition: Follow up in 1 year.   Medication Adjustments/Labs and Tests Ordered: Current medicines are reviewed at length with the patient today.  Concerns regarding medicines are outlined above.  Orders Placed This Encounter  Procedures   EKG 12-Lead   No orders of the  defined types were placed in this encounter.  Patient Instructions  Medication Instructions:  Your physician recommends that you continue on your current medications as directed. Please refer to the Current Medication list given to you today.   *If you need a refill on your cardiac medications before your next appointment, please call your pharmacy*   Lab Work: NONE ordered at this time of appointment   If you have labs (blood work) drawn today and your tests are completely normal, you will receive your results only by: [place] (if you have MyChart) OR A paper copy in the mail If you have any lab test that is abnormal or we need to change your treatment, we will call you to review the results.   Testing/Procedures: NONE ordered at this time of appointment     Follow-Up: At [facility], you and your health needs are our priority.  As part of our continuing mission to provide you with exceptional heart care, we have created designated Provider Care Teams.  These Care Teams include your primary Cardiologist (physician) and Advanced Practice Providers (APPs -  Physician Assistants and Nurse Practitioners) who all work together to provide you with the care you need, when you need it.  We recommend signing up for the patient portal called "MyChart".  Sign up information is provided on this After Visit Summary.  MyChart is used to connect with patients for Virtual Visits (Telemedicine).  Patients are able to view lab/test results, encounter notes, upcoming appointments, etc.  Non-urgent messages can be sent to your provider as well.   To learn more about what you can do with MyChart, go to [email or web address].    Your next appointment:   1 year(s)  The format for your next appointment:   In Person  Provider:   [name], MD     Other Instructions Monitor Blood pressure and heart rate for 2 weeks. Please monitor Dizziness and report consistent dizziness.    Important Information About Sugar         Signed, Eppie Gibson  12/31/2021 [facility]

## 2021-12-28 ENCOUNTER — Other Ambulatory Visit (INDEPENDENT_AMBULATORY_CARE_PROVIDER_SITE_OTHER): Payer: BC Managed Care – PPO | Admitting: Internal Medicine

## 2021-12-28 ENCOUNTER — Encounter: Payer: Self-pay | Admitting: Internal Medicine

## 2021-12-28 ENCOUNTER — Other Ambulatory Visit (HOSPITAL_COMMUNITY): Payer: Self-pay | Admitting: Nurse Practitioner

## 2021-12-28 DIAGNOSIS — Z5181 Encounter for therapeutic drug level monitoring: Secondary | ICD-10-CM

## 2021-12-28 DIAGNOSIS — D62 Acute posthemorrhagic anemia: Secondary | ICD-10-CM

## 2021-12-28 DIAGNOSIS — Z483 Aftercare following surgery for neoplasm: Secondary | ICD-10-CM

## 2021-12-28 DIAGNOSIS — I10 Essential (primary) hypertension: Secondary | ICD-10-CM

## 2021-12-28 DIAGNOSIS — C189 Malignant neoplasm of colon, unspecified: Secondary | ICD-10-CM

## 2021-12-28 DIAGNOSIS — D630 Anemia in neoplastic disease: Secondary | ICD-10-CM

## 2021-12-28 DIAGNOSIS — K5792 Diverticulitis of intestine, part unspecified, without perforation or abscess without bleeding: Secondary | ICD-10-CM

## 2021-12-28 DIAGNOSIS — C187 Malignant neoplasm of sigmoid colon: Secondary | ICD-10-CM

## 2021-12-28 DIAGNOSIS — K5720 Diverticulitis of large intestine with perforation and abscess without bleeding: Secondary | ICD-10-CM

## 2021-12-28 DIAGNOSIS — D649 Anemia, unspecified: Secondary | ICD-10-CM

## 2021-12-28 DIAGNOSIS — E440 Moderate protein-calorie malnutrition: Secondary | ICD-10-CM

## 2021-12-28 DIAGNOSIS — R5381 Other malaise: Secondary | ICD-10-CM

## 2021-12-28 DIAGNOSIS — G4730 Sleep apnea, unspecified: Secondary | ICD-10-CM

## 2021-12-28 DIAGNOSIS — K9400 Colostomy complication, unspecified: Secondary | ICD-10-CM

## 2021-12-28 DIAGNOSIS — Z515 Encounter for palliative care: Secondary | ICD-10-CM

## 2021-12-28 DIAGNOSIS — Z933 Colostomy status: Secondary | ICD-10-CM

## 2021-12-28 DIAGNOSIS — E43 Unspecified severe protein-calorie malnutrition: Secondary | ICD-10-CM

## 2021-12-28 NOTE — Progress Notes (Signed)
Received home health orders orders from [facility]. Start of care 05/17/21.   Certification and orders from 09/14/21 through 11/12/21 are reviewed, signed and faxed back to home health company.  Need of intermittent skilled services at home: PT 1wk3  The home health care plan has been established by me and will be reviewed and updated as needed to maximize patient recovery.  I certify that all home health services have been and will be furnished to the patient while under my care.  Face-to-face encounter in which the need for home health services was established: 05/15/21  Patient is receiving home health services for the following diagnoses: Problem List Items Addressed This Visit       Cardiovascular and Mediastinum   Essential hypertension     Digestive   Diverticulitis of colon with perforation   Malignant neoplasm of sigmoid colon ([place])     Other   Protein-calorie malnutrition, severe ([place])   Debility   Palliative care patient   Anemia   Other Visit Diagnoses     Colostomy status (HCC)            Maximino Greenland, MD

## 2021-12-28 NOTE — Progress Notes (Signed)
HPI   Received home health orders orders from [facility]. Start of care 05/17/21.   Certification and orders from 11/13/21  through 01/11/22 are reviewed, signed and faxed back to home health company.  Need of intermittent skilled services at home: PT effective 11/15/21 1wk4  The home health care plan has been established by me and will be reviewed and updated as needed to maximize patient recovery.  I certify that all home health services have been and will be furnished to the patient while under my care.  Face-to-face encounter in which the need for home health services was established: 05/15/21  Patient is receiving home health services for the following diagnoses: Problem List Items Addressed This Visit       Other   Anemia   Other Visit Diagnoses     Malignant neoplasm of colon, unspecified part of colon ([place])    -  Primary   Surgical aftercare, neoplasm       Essential hypertension, benign       Diverticulitis of intestine without perforation or abscess without bleeding, unspecified part of intestinal tract       Malnutrition of moderate degree ([place])       Sleep apnea, organic       Colostomy status ([place])       Encounter for monitoring opioid maintenance therapy            Maximino Greenland, MD

## 2021-12-29 ENCOUNTER — Encounter (HOSPITAL_COMMUNITY): Payer: Self-pay | Admitting: Nurse Practitioner

## 2021-12-29 DIAGNOSIS — Z4801 Encounter for change or removal of surgical wound dressing: Secondary | ICD-10-CM | POA: Diagnosis not present

## 2021-12-29 DIAGNOSIS — Z85038 Personal history of other malignant neoplasm of large intestine: Secondary | ICD-10-CM | POA: Diagnosis not present

## 2021-12-29 DIAGNOSIS — K5792 Diverticulitis of intestine, part unspecified, without perforation or abscess without bleeding: Secondary | ICD-10-CM | POA: Diagnosis not present

## 2021-12-29 DIAGNOSIS — Z9181 History of falling: Secondary | ICD-10-CM | POA: Diagnosis not present

## 2021-12-29 DIAGNOSIS — F32A Depression, unspecified: Secondary | ICD-10-CM | POA: Diagnosis not present

## 2021-12-29 DIAGNOSIS — E440 Moderate protein-calorie malnutrition: Secondary | ICD-10-CM | POA: Diagnosis not present

## 2021-12-29 DIAGNOSIS — S31109A Unspecified open wound of abdominal wall, unspecified quadrant without penetration into peritoneal cavity, initial encounter: Secondary | ICD-10-CM | POA: Diagnosis not present

## 2021-12-29 DIAGNOSIS — E876 Hypokalemia: Secondary | ICD-10-CM | POA: Diagnosis not present

## 2021-12-29 DIAGNOSIS — Z9049 Acquired absence of other specified parts of digestive tract: Secondary | ICD-10-CM | POA: Diagnosis not present

## 2021-12-29 DIAGNOSIS — Z483 Aftercare following surgery for neoplasm: Secondary | ICD-10-CM | POA: Diagnosis not present

## 2021-12-29 DIAGNOSIS — E669 Obesity, unspecified: Secondary | ICD-10-CM | POA: Diagnosis not present

## 2021-12-29 DIAGNOSIS — I10 Essential (primary) hypertension: Secondary | ICD-10-CM | POA: Diagnosis not present

## 2021-12-29 DIAGNOSIS — F419 Anxiety disorder, unspecified: Secondary | ICD-10-CM | POA: Diagnosis not present

## 2021-12-29 DIAGNOSIS — Z79891 Long term (current) use of opiate analgesic: Secondary | ICD-10-CM | POA: Diagnosis not present

## 2021-12-29 DIAGNOSIS — G4730 Sleep apnea, unspecified: Secondary | ICD-10-CM | POA: Diagnosis not present

## 2021-12-29 DIAGNOSIS — C189 Malignant neoplasm of colon, unspecified: Secondary | ICD-10-CM | POA: Diagnosis not present

## 2021-12-29 DIAGNOSIS — Z933 Colostomy status: Secondary | ICD-10-CM | POA: Diagnosis not present

## 2021-12-29 DIAGNOSIS — D630 Anemia in neoplastic disease: Secondary | ICD-10-CM | POA: Diagnosis not present

## 2021-12-29 DIAGNOSIS — Z6831 Body mass index (BMI) 31.0-31.9, adult: Secondary | ICD-10-CM | POA: Diagnosis not present

## 2021-12-30 DIAGNOSIS — Z85038 Personal history of other malignant neoplasm of large intestine: Secondary | ICD-10-CM | POA: Diagnosis not present

## 2021-12-30 DIAGNOSIS — Z933 Colostomy status: Secondary | ICD-10-CM | POA: Diagnosis not present

## 2021-12-30 DIAGNOSIS — Z4801 Encounter for change or removal of surgical wound dressing: Secondary | ICD-10-CM | POA: Diagnosis not present

## 2021-12-30 DIAGNOSIS — S31109A Unspecified open wound of abdominal wall, unspecified quadrant without penetration into peritoneal cavity, initial encounter: Secondary | ICD-10-CM | POA: Diagnosis not present

## 2021-12-31 ENCOUNTER — Encounter: Payer: Self-pay | Admitting: Student

## 2021-12-31 ENCOUNTER — Ambulatory Visit (INDEPENDENT_AMBULATORY_CARE_PROVIDER_SITE_OTHER): Payer: BC Managed Care – PPO | Admitting: Student

## 2021-12-31 VITALS — BP 122/82 | HR 78 | Ht 63.0 in | Wt 188.4 lb

## 2021-12-31 DIAGNOSIS — G4733 Obstructive sleep apnea (adult) (pediatric): Secondary | ICD-10-CM | POA: Diagnosis not present

## 2021-12-31 DIAGNOSIS — I2510 Atherosclerotic heart disease of native coronary artery without angina pectoris: Secondary | ICD-10-CM

## 2021-12-31 DIAGNOSIS — I10 Essential (primary) hypertension: Secondary | ICD-10-CM

## 2021-12-31 DIAGNOSIS — I2584 Coronary atherosclerosis due to calcified coronary lesion: Secondary | ICD-10-CM

## 2021-12-31 DIAGNOSIS — R42 Dizziness and giddiness: Secondary | ICD-10-CM | POA: Diagnosis not present

## 2021-12-31 DIAGNOSIS — G479 Sleep disorder, unspecified: Secondary | ICD-10-CM

## 2021-12-31 DIAGNOSIS — Z8679 Personal history of other diseases of the circulatory system: Secondary | ICD-10-CM

## 2021-12-31 DIAGNOSIS — Z87898 Personal history of other specified conditions: Secondary | ICD-10-CM

## 2021-12-31 DIAGNOSIS — R55 Syncope and collapse: Secondary | ICD-10-CM

## 2021-12-31 NOTE — Patient Instructions (Addendum)
Medication Instructions:  Your physician recommends that you continue on your current medications as directed. Please refer to the Current Medication list given to you today.   *If you need a refill on your cardiac medications before your next appointment, please call your pharmacy*   Lab Work: NONE ordered at this time of appointment   If you have labs (blood work) drawn today and your tests are completely normal, you will receive your results only by: [place] (if you have MyChart) OR A paper copy in the mail If you have any lab test that is abnormal or we need to change your treatment, we will call you to review the results.   Testing/Procedures: NONE ordered at this time of appointment     Follow-Up: At [facility], you and your health needs are our priority.  As part of our continuing mission to provide you with exceptional heart care, we have created designated Provider Care Teams.  These Care Teams include your primary Cardiologist (physician) and Advanced Practice Providers (APPs -  Physician Assistants and Nurse Practitioners) who all work together to provide you with the care you need, when you need it.  We recommend signing up for the patient portal called "MyChart".  Sign up information is provided on this After Visit Summary.  MyChart is used to connect with patients for Virtual Visits (Telemedicine).  Patients are able to view lab/test results, encounter notes, upcoming appointments, etc.  Non-urgent messages can be sent to your provider as well.   To learn more about what you can do with MyChart, go to [email or web address].    Your next appointment:   1 year(s)  The format for your next appointment:   In Person  Provider:   [name], MD     Other Instructions Monitor Blood pressure and heart rate for 2 weeks. Please monitor Dizziness and report consistent dizziness.   Important Information About Sugar

## 2022-01-07 DIAGNOSIS — D630 Anemia in neoplastic disease: Secondary | ICD-10-CM | POA: Diagnosis not present

## 2022-01-07 DIAGNOSIS — K5792 Diverticulitis of intestine, part unspecified, without perforation or abscess without bleeding: Secondary | ICD-10-CM | POA: Diagnosis not present

## 2022-01-07 DIAGNOSIS — Z6831 Body mass index (BMI) 31.0-31.9, adult: Secondary | ICD-10-CM | POA: Diagnosis not present

## 2022-01-07 DIAGNOSIS — F419 Anxiety disorder, unspecified: Secondary | ICD-10-CM | POA: Diagnosis not present

## 2022-01-07 DIAGNOSIS — Z79891 Long term (current) use of opiate analgesic: Secondary | ICD-10-CM | POA: Diagnosis not present

## 2022-01-07 DIAGNOSIS — E440 Moderate protein-calorie malnutrition: Secondary | ICD-10-CM | POA: Diagnosis not present

## 2022-01-07 DIAGNOSIS — Z9181 History of falling: Secondary | ICD-10-CM | POA: Diagnosis not present

## 2022-01-07 DIAGNOSIS — E669 Obesity, unspecified: Secondary | ICD-10-CM | POA: Diagnosis not present

## 2022-01-07 DIAGNOSIS — Z933 Colostomy status: Secondary | ICD-10-CM | POA: Diagnosis not present

## 2022-01-07 DIAGNOSIS — Z483 Aftercare following surgery for neoplasm: Secondary | ICD-10-CM | POA: Diagnosis not present

## 2022-01-07 DIAGNOSIS — E876 Hypokalemia: Secondary | ICD-10-CM | POA: Diagnosis not present

## 2022-01-07 DIAGNOSIS — F32A Depression, unspecified: Secondary | ICD-10-CM | POA: Diagnosis not present

## 2022-01-07 DIAGNOSIS — I10 Essential (primary) hypertension: Secondary | ICD-10-CM | POA: Diagnosis not present

## 2022-01-07 DIAGNOSIS — G4730 Sleep apnea, unspecified: Secondary | ICD-10-CM | POA: Diagnosis not present

## 2022-01-07 DIAGNOSIS — C189 Malignant neoplasm of colon, unspecified: Secondary | ICD-10-CM | POA: Diagnosis not present

## 2022-01-07 DIAGNOSIS — Z9049 Acquired absence of other specified parts of digestive tract: Secondary | ICD-10-CM | POA: Diagnosis not present

## 2022-01-08 ENCOUNTER — Ambulatory Visit (HOSPITAL_COMMUNITY)
Admission: RE | Admit: 2022-01-08 | Discharge: 2022-01-08 | Disposition: A | Discharge status: Home / Self Care | Payer: BC Managed Care – PPO | Source: Ambulatory Visit | Attending: Hematology | Admitting: Hematology

## 2022-01-08 DIAGNOSIS — J9811 Atelectasis: Secondary | ICD-10-CM | POA: Diagnosis not present

## 2022-01-08 DIAGNOSIS — C800 Disseminated malignant neoplasm, unspecified: Secondary | ICD-10-CM | POA: Diagnosis not present

## 2022-01-08 DIAGNOSIS — N131 Hydronephrosis with ureteral stricture, not elsewhere classified: Secondary | ICD-10-CM | POA: Diagnosis not present

## 2022-01-08 DIAGNOSIS — C189 Malignant neoplasm of colon, unspecified: Secondary | ICD-10-CM | POA: Diagnosis not present

## 2022-01-08 DIAGNOSIS — Z85038 Personal history of other malignant neoplasm of large intestine: Secondary | ICD-10-CM | POA: Diagnosis not present

## 2022-01-08 DIAGNOSIS — Z933 Colostomy status: Secondary | ICD-10-CM | POA: Diagnosis not present

## 2022-01-08 DIAGNOSIS — K8020 Calculus of gallbladder without cholecystitis without obstruction: Secondary | ICD-10-CM | POA: Diagnosis not present

## 2022-01-08 DIAGNOSIS — S31109A Unspecified open wound of abdominal wall, unspecified quadrant without penetration into peritoneal cavity, initial encounter: Secondary | ICD-10-CM | POA: Diagnosis not present

## 2022-01-08 DIAGNOSIS — C187 Malignant neoplasm of sigmoid colon: Secondary | ICD-10-CM | POA: Insufficient documentation

## 2022-01-08 DIAGNOSIS — K435 Parastomal hernia without obstruction or  gangrene: Secondary | ICD-10-CM | POA: Diagnosis not present

## 2022-01-08 DIAGNOSIS — C7951 Secondary malignant neoplasm of bone: Secondary | ICD-10-CM | POA: Diagnosis not present

## 2022-01-08 DIAGNOSIS — Z4801 Encounter for change or removal of surgical wound dressing: Secondary | ICD-10-CM | POA: Diagnosis not present

## 2022-01-08 MED ORDER — IOHEXOL 300 MG/ML  SOLN
100.0000 mL | Freq: Once | INTRAMUSCULAR | Status: AC | PRN
  Administered 2022-01-08: 100 mL via INTRAVENOUS

## 2022-01-10 NOTE — Progress Notes (Cosign Needed Addendum)
[place]   Telephone:(336) [phone] Fax:(336) [phone]   Clinic Follow up Note   Patient Care Team: [name], MD as PCP - General (Internal Medicine) [name], MD as PCP - Cardiology (Cardiology) [name], [name] as Physician Assistant (Emergency Medicine) [name], MD as Consulting Physician (Oncology) [name], RN as Oncology Nurse Navigator (Oncology) [name], MD as Consulting Physician (General Surgery) [name], [name], MD as Consulting Physician (Neurology) [name], [name], MD as Consulting Physician (Obstetrics and Gynecology) [name], MD as Consulting Physician (Gastroenterology) [name], [name], NP as Nurse Practitioner (Nurse Practitioner) 01/12/2022  CHIEF COMPLAINT: Follow up colon cancer   SUMMARY OF ONCOLOGIC HISTORY: Oncology History Overview Note   Cancer Staging  Malignant neoplasm of sigmoid colon [facility]) Staging form: Colon and Rectum, AJCC 8th Edition - Pathologic stage from 04/15/2021: Stage IIC (pT4b, pN0, cM0) - Signed by [name], MD on 06/12/2021    Malignant neoplasm of sigmoid colon [facility])   Initial Diagnosis   Malignant neoplasm of sigmoid colon ([place])   11/04/2020 Imaging   EXAM: CT ABDOMEN AND PELVIS WITHOUT CONTRAST  IMPRESSION: 1. Perforating descending colonic diverticulitis with multiple abdominopelvic gas and fluid collections, measuring up to 5.7 cm and further described above. 2. Cholelithiasis without findings of acute cholecystitis. 3. 3.6 cm benign left adrenal adenoma. 4. Leiomyomatous uterus. 5. 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   11/07/2020 Imaging   EXAM: CT ABDOMEN AND PELVIS WITHOUT CONTRAST  IMPRESSION: Continued wall thickening is seen involving descending colon suggesting infectious or inflammatory colitis or perforated diverticulitis.  There is an adjacent fluid collection measuring 7.0 x 5.0 cm consistent with abscess which is significantly enlarged compared to prior exam. Adjacent to the abscess, there is a severely thickened small bowel loop most consistent with secondary inflammation.   5.2 x 3.5 cm fluid collection is noted within the left psoas muscle consistent with abscess which is significantly enlarged compared to prior exam. 4.4 x 3.8 cm fluid collection consistent with abscess is noted in the left retroperitoneal region which is also enlarged compared to prior exam.   Fibroid uterus.   Cholelithiasis.   11/27/2020 Imaging   EXAM: CT ABDOMEN AND PELVIS WITHOUT CONTRAST  IMPRESSION: 1. Significantly interval decreased size of the previously visualized left retroperitoneal and left anterior mid abdominal fluid collections. There is persistent fat stranding and mural thickening of the mid descending colon at this level. 2. Unchanged leiomyomatous uterus. 3. Unchanged cholelithiasis.   12/11/2020 Imaging   EXAM: CT ABDOMEN AND PELVIS WITHOUT CONTRAST  IMPRESSION: 1. Stable inflammatory changes centered around the distal descending colon in the left lower abdomen. Pericolonic inflammatory changes have minimally changed since 11/27/2020. Small pocket of gas medial to the colon is probably associated with a fistula or small residual abscess collection. 2. Stable position of the two percutaneous drains. The more anterior drain may be extending through a portion of the small bowel. 3. Cholelithiasis. 4. Fibroid uterus. Cannot exclude an ovarian/adnexal cystic structure near the uterine fundus. 5. Left adrenal adenoma.   01/07/2021 Imaging   EXAM: CT ABDOMEN AND PELVIS WITH CONTRAST  IMPRESSION: 1. No new abscesses identified. Similar degree of soft tissue thickening seen in the left pericolonic region. The degree of persistent soft tissues thickening in the pericolonic space further raises suspicions for  malignancy. Further evaluation with colonoscopy should be performed. 2. Left retroperitoneal abscess drain unchanged in  position. Anterior left abdominal drain again seen terminating within small bowel loop. 3. 2.6 cm mildly sclerotic lesion noted in the L2 vertebral body. Further evaluation with contrast enhanced lumbar spine MRI should be performed.   02/04/2021 Imaging   EXAM: CT ABDOMEN AND PELVIS WITH CONTRAST  IMPRESSION: No new abdominopelvic collections or abscess development in the 1 month interval.   Left anterior drain remains within a loop of small bowel, unchanged.   Left lateral abscess drain remains in the retroperitoneal space adjacent to the iliopsoas muscle with a small amount of residual fluid and air but no measurable collection.   Stable soft tissue prominence and pericolonic strandy edema/inflammation about the left descending colon compatible with residual diverticulitis/colitis. Difficult to exclude underlying transmural lesion.   04/01/2021 Imaging   EXAM: CT ABDOMEN AND PELVIS WITH CONTRAST  IMPRESSION: There appears to be significant enhancement and wall thickening involving the descending colon with some degree of traction and involvement of adjacent small bowel loops. This is consistent with the history of diverticulitis and perforation. Stable position of percutaneous drainage catheter is seen adjacent to left psoas muscle with no significant residual fluid remaining. The other percutaneous drainage catheter that was previously noted to be within small bowel loop on prior exam in the left lower quadrant, has significantly retracted and appears to be outside of the peritoneal space at this time.   Since the prior exam, there does appear to be some degree of rotation involving mesenteric vessels and structures in the right lower quadrant, suggesting partial volvulus or malrotation. There is the interval development of several lymph nodes in this area,  most likely inflammatory or reactive in etiology. Mild amount of free fluid is also noted in the pelvis. However, no significant bowel wall thickening or dilatation is seen in this area. These results will be called to the ordering clinician or representative by the Radiologist Assistant, and communication documented in the PACS or zVision Dashboard.   Stable uterine fibroid.   Hepatic steatosis.   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   Cholelithiasis.   04/10/2021 Imaging   EXAM: CT ANGIOGRAPHY CHEST CT ABDOMEN AND PELVIS WITH CONTRAST  IMPRESSION: 1. Again seen are findings compatible with descending colon diverticulitis with perforation. Free air and inflammation have increased in the interval. 2. New lobulated enhancing fluid collection posterior to this segment of inflamed colon measuring 8.5 x 3.5 x 10.0 cm. This collection now invades the adjacent iliopsoas muscle as well as extends through the left lateral abdominal wall. The tip of the drainage catheter is in this collection. Findings are compatible with abscess. 3. Anterior left percutaneous drainage catheter tip has been pulled back and is now within the subcutaneous tissues. 4. Trace free fluid. 5. No pulmonary embolism.  No acute cardiopulmonary process. 6. Cholelithiasis. 7. Fatty infiltration of the liver.   04/15/2021 Definitive Surgery   FINAL MICROSCOPIC DIAGNOSIS:   A. SMALL BOWEL, RESECTION:  - Adenocarcinoma.  - No carcinoma identified in 1 lymph node.   B. PERFORATED LEFT COLON, RESECTION:  - Moderately differentiated colonic adenocarcinoma.  - Tumor extends into pericolonic adipose tissue, and is strongly  suggestive of invasion into small bowel.  See oncology table/comments.  - No carcinoma identified in 4 lymph nodes (0/4).  - Tubular adenoma with high-grade dysplasia, 1.  - Tubular adenomas with low grade dysplasia, 3.   Comments: The size of the tumor is difficult to estimate secondary to the  disrupted nature of the specimen, as well as the  infiltrative nature of the tumor.  Tumor can be identified as definitely invading into the pericolonic adipose tissue (block B4), but given the extreme disruption of the tissue, and the involvement of the small bowel by what appears to be a colonic adenocarcinoma, I favor perforation of the large bowel with direct invasion into the small bowel.  Accordingly, I believe this is best regarded as a pT4b lesion.   ADDENDUM:  Mismatch Repair Protein (IHC)  SUMMARY INTERPRETATION: ABNORMAL  There is loss of the major MMR protein [name]6: This indicates a high probability that a hereditary germline mutation is present and referral to genetic counseling is warranted. It is recommended that the loss of protein expression be correlated with molecular based microsatellite instability testing.   IHC EXPRESSION RESULTS  TEST           RESULT  MLH1:          Preserved nuclear expression  [name]2:          Preserved nuclear expression  [name]6:          LOSS OF NUCLEAR EXPRESSION  PMS2:          Preserved nuclear expression    04/15/2021 Cancer Staging   Staging form: Colon and Rectum, AJCC 8th Edition - Pathologic stage from 04/15/2021: Stage IIC (pT4b, pN0, cM0) - Signed by [name], MD on 06/12/2021 Stage prefix: Initial diagnosis Total positive nodes: 0 Histologic grading system: 4 grade system Histologic grade (G): G2 Residual tumor (R): R0 - None   04/29/2021 Imaging   EXAM: CT ABDOMEN AND PELVIS WITH CONTRAST  IMPRESSION: Continued left retroperitoneal abscesses inferior to the left kidney, involving the left psoas muscle and left abdominal wall musculature. Overall size is decreased since prior study. Interval removal of left lower quadrant abscess drainage catheter.   New fluid collection in the cul-de-sac and wrapping around the uterus concerning for abscess.   Cholelithiasis.   Small left pleural effusion.  Bibasilar atelectasis.   Stable left  adrenal adenoma.  ADDENDUM: After discussing the case with Dr. Dwaine Gale in interventional radiology, it was noted that the fluid in the pelvis originally thought to be in the cul-de-sac is likely within the vagina, best seen on sagittal imaging. Recommend speculum exam.   Also, in the left lateral wall in the area of prior abscess in the lateral wall abdominal musculature, some of the abdomen soft tissue appears to be enhancing. While this could be infectious, cannot exclude tumor seeding in the left lateral abdominal wall, measuring 6.9 x 3.8 cm on image 64 of series 2.   06/16/2021 Imaging   EXAM: CT ABDOMEN AND PELVIS WITH CONTRAST  IMPRESSION: Increased size of bulky soft tissue masses involving the left psoas muscle and left lateral abdominal wall soft tissues, consistent with progressive metastatic disease.   Significant progression of multiple peritoneal masses throughout the abdomen and pelvis, consistent with peritoneal carcinomatosis.   New mild retroperitoneal lymphadenopathy, consistent with metastatic disease.   Stable uterine fibroid and left adrenal adenoma.   Cholelithiasis, without evidence of cholecystitis.   06/22/2021 - 08/19/2021 Chemotherapy   Patient is on Treatment Plan : COLORECTAL FOLFOX q14d x 6 months     06/22/2021 Tumor Marker   Patient's tumor was tested for the following markers: CEA. Results of the tumor marker test revealed 87.41.   07/10/2021 Pathology Results   FINAL MICROSCOPIC DIAGNOSIS:   A. PERITONEAL MASS, LEFT UPPER ABDOMINAL QUADRANT, BIOPSY:  -  Metastatic adenocarcinoma with necrosis, histologically consistent with  colon primary.     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 The report date is 07/16/2021. The remainder of testing was negative/normal.  The CancerNext-Expanded + RNAinsight gene panel offered by [employer] and includes sequencing and rearrangement  analysis for the following 77 genes: IP, ALK, APC*, ATM*, AXIN2, BAP1, BARD1, BLM, BMPR1A, BRCA1*, BRCA2*, BRIP1*, CDC73, CDH1*,CDK4, CDKN1B, CDKN2A, CHEK2*, CTNNA1, DICER1, FANCC, FH, FLCN, GALNT12, KIF1B, LZTR1, MAX, MEN1, MET, MLH1*, [name]2*, [name]3, [name]6*, MUTYH*, NBN, NF1*, NF2, NTHL1, PALB2*, PHOX2B, PMS2*, POT1, PRKAR1A, PTCH1, PTEN*, RAD51C*, RAD51D*,RB1, RECQL, RET, SDHA, SDHAF2, SDHB, SDHC, SDHD, SMAD4, SMARCA4, SMARCB1, SMARCE1, STK11, SUFU, TMEM127, TP53*,TSC1, TSC2, VHL and XRCC2 (sequencing and deletion/duplication); EGFR, EGLN1, HOXB13, KIT, MITF, PDGFRA, POLD1 and POLE (sequencing only); EPCAM and GREM1 (deletion/duplication only).   08/28/2021 Imaging   EXAM: CT CHEST, ABDOMEN, AND PELVIS WITH CONTRAST  IMPRESSION: 1. Continued interval progression of multiple metastatic soft tissue nodules/masses in the abdomen and pelvis. 2. No evidence for metastatic disease in the chest. 3. Similar appearance of the subtle lesion in the L2 vertebral body, suspicious for metastatic involvement. 4. Cholelithiasis.   09/08/2021 -  Chemotherapy   Patient is on Treatment Plan : COLORECTAL Pembrolizumab (200) q21d     01/08/2022 Imaging   CT CAP IMPRESSION: 1. Mixed response to therapy. Interval decrease in size of the conglomerate multilobulated mass extending from the left psoas into the left posterior pararenal space through the left lateral abdominal wall along the tract of the previously placed percutaneous drainage catheter. Interval decrease in size of right adnexal mass and omental masses within the right lower quadrant of the abdomen. Interval development of peritoneal carcinomatosis with omental caking within the left lower quadrant of the abdomen and left subdiaphragmatic region adjacent to the spleen and along the left pericolic gutter as well as development of ascites appearing loculated within the anterior peritoneum. 2. Interval development of mild left hydronephrosis secondary to stricturing  of the mid left ureter. 3. Stable left adrenal metastasis. 4. Stable sclerotic lesion within the L2 vertebral body. No new lytic or blastic bone lesions are seen. 5. Cholelithiasis. 6. No evidence of intrathoracic metastatic disease. 7. 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 Moderate colonic stool burden. No evidence of obstruction.     CURRENT THERAPY: Pembrolizumab/Keytruda q21 days, starting 09/08/21  INTERVAL HISTORY: Mikayla Rios returns for follow up as scheduled, feeling well but nervous for scan results. Last seen by Dr. Burr Medico 12/22/21 and completed cycle 6 keytruda.  She continues to have fibromyalgia joint pain flares in her shoulders, elbows, knees, and hands but remains functional.  Movement/staying active helps.  Denies new or worsening pain.  She is working with nutrition to add back certain foods in her diet and fiber, she occasionally feels full quicker with intermittent constipation.  She was "cleaned out" after CT contrast.  Denies abdominal pain, nausea, vomiting, or blood in stool.  Denies fever, chills, cough, chest pain, dyspnea, leg edema, rash, or any other new specific complaints.  All other systems were reviewed with the patient and are negative.  MEDICAL HISTORY:  Past Medical History:  Diagnosis Date   Allergy    seasonal   Anemia    Blood infection 1985   Blood transfusion without reported diagnosis    Diverticulitis    Family history of breast cancer    Family history of colon cancer    Family history  of stomach cancer    Hypertension    Obesity    Sleep apnea     SURGICAL HISTORY: Past Surgical History:  Procedure Laterality Date   COLECTOMY WITH COLOSTOMY CREATION/HARTMANN PROCEDURE N/A 04/15/2021   Procedure: COLOSTOMY CREATION/HARTMANN PROCEDURE;  Surgeon: [name], MD;  Location: [facility];  Service: General;  Laterality: N/A;   IR CATHETER TUBE CHANGE  12/12/2020   IR  CATHETER TUBE CHANGE  01/27/2021   IR CATHETER TUBE CHANGE  02/19/2021   IR CATHETER TUBE CHANGE  04/13/2021   IR IMAGING GUIDED PORT INSERTION  10/02/2021   IR RADIOLOGIST EVAL & MGMT  11/27/2020   IR RADIOLOGIST EVAL & MGMT  12/11/2020   IR RADIOLOGIST EVAL & MGMT  01/07/2021   IR RADIOLOGIST EVAL & MGMT  02/04/2021   IR SINUS/FIST TUBE CHK-NON GI  12/12/2020   IR SINUS/FIST TUBE CHK-NON GI  02/19/2021   LAPAROSCOPIC PARTIAL COLECTOMY N/A 04/15/2021   Procedure: LAPAROSCOPIC ASSISTED HARTMANN RESECTION;  Surgeon: [name], MD;  Location: [facility];  Service: General;  Laterality: N/A;    I have reviewed the social history and family history with the patient and they are unchanged from previous note.  ALLERGIES:  is allergic to chlorhexidine gluconate, shellfish allergy, and penicillins.  MEDICATIONS:  Current Outpatient Medications  Medication Sig Dispense Refill   amLODipine (NORVASC) 10 MG tablet Take 10 mg by mouth every evening.     DULoxetine (CYMBALTA) 60 MG capsule TAKE 1 CAPSULE BY MOUTH EVERY DAY 90 capsule 2   HYDROmorphone (DILAUDID) 2 MG tablet Take 1 tablet (2 mg total) by mouth every 6 (six) hours as needed for severe pain. 30 tablet 0   metoprolol tartrate 75 MG TABS Take 75 mg by mouth 2 (two) times daily. 90 tablet 3   potassium chloride 20 MEQ/15ML (10%) SOLN Take 45 mLs (60 mEq total) by mouth daily. 473 mL 3   POTASSIUM PO Take by mouth.     No current facility-administered medications for this visit.   Facility-Administered Medications Ordered in Other Visits  Medication Dose Route Frequency Provider Last Rate Last Admin   fentaNYL (SUBLIMAZE) injection   Intravenous PRN Mir, Paula Libra, MD   50 mcg at 10/02/21 1436   heparin lock flush 100 unit/mL  500 Units Intracatheter Once PRN [name], MD       midazolam (VERSED) injection   Intravenous PRN Mir, Paula Libra, MD   1 mg at 10/02/21 1436   pembrolizumab (KEYTRUDA) 200 mg in sodium chloride 0.9 % 50 mL chemo  infusion  200 mg Intravenous Once [name], MD       sodium chloride flush (NS) 0.9 % injection 10 mL  10 mL Intracatheter PRN [name], MD        PHYSICAL EXAMINATION: ECOG PERFORMANCE STATUS: 1 - Symptomatic but completely ambulatory  Vitals:   01/12/22 0929  BP: 131/88  Pulse: 82  Resp: 17  Temp: 99.3 F (37.4 C)  SpO2: 100%   Filed Weights   01/12/22 0929  Weight: 187 lb 3.2 oz (84.9 kg)    GENERAL:alert, no distress and comfortable SKIN: No rash EYES: sclera clear NECK: Without mass LYMPH:  no palpable cervical or supraclavicular lymphadenopathy LUNGS: clear with normal breathing effort HEART: regular rate & rhythm, no lower extremity edema ABDOMEN:abdomen soft, non-tender and normal bowel sounds.  Colostomy noted.  Left abdominal sidewall abscess healed with scant drainage Musculoskeletal: no cyanosis of digits and no clubbing  NEURO: alert &  oriented x 3 with fluent speech, no focal motor/sensory deficits PAC without erythema  LABORATORY DATA:  I have reviewed the data as listed    Latest Ref Rng & Units 01/12/2022    9:18 AM 12/22/2021    8:11 AM 12/01/2021    8:12 AM  CBC  WBC 4.0 - 10.5 K/uL 9.8  7.5  7.0   Hemoglobin 12.0 - 15.0 g/dL 8.8  8.7  9.1   Hematocrit 36.0 - 46.0 % 27.8  27.7  29.7   Platelets 150 - 400 K/uL 654  510  500         Latest Ref Rng & Units 01/12/2022    9:18 AM 12/22/2021    8:11 AM 12/01/2021    8:12 AM  CMP  Glucose 70 - 99 mg/dL 99  108  105   BUN 6 - 20 mg/dL 12  8  10    Creatinine 0.44 - 1.00 mg/dL 0.83  0.54  0.54   Sodium 135 - 145 mmol/L 138  141  140   Potassium 3.5 - 5.1 mmol/L 3.5  3.2  3.0   Chloride 98 - 111 mmol/L 103  107  105   CO2 22 - 32 mmol/L 27  26  27    Calcium 8.9 - 10.3 mg/dL 9.2  9.2  9.1   Total Protein 6.5 - 8.1 g/dL 8.1  7.1  7.2   Total Bilirubin 0.3 - 1.2 mg/dL 0.2  0.3  0.3   Alkaline Phos 38 - 126 U/L 106  59  48   AST 15 - 41 U/L 12  8  9    ALT 0 - 44 U/L 7  5  6        RADIOGRAPHIC  STUDIES: I have personally reviewed the radiological images as listed and agreed with the findings in the report. No results found.   ASSESSMENT & PLAN: Mikayla Rios is a 36 y.o. female with    1. 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 She was found to have diverticulitis with perforation. She also developed an abscess requiring 2 drains. She continued to have abdominal pain and drainage, undergoing drain exchange three times. -she developed worsening weakness and was admitted on 04/10/21. She was taken for emergent small bowel and left colon resection on 04/15/21 under Dr. Johney Maine. 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 Margins negative for invasive carcinoma, but low-grade dysplasia involves proximal margin. All 5 lymph nodes negative (0/5). MSI low. -repeat CT AP on 06/16/21 showed progressive metastatic disease involving soft tissue masses in left psoas muscle and left lateral abdominal wall and peritoneal masses, and new mild retroperitoneal lymphadenopathy.  -baseline CEA on 06/22/21 was elevated at 87.41. -biopsy of the LUQ peritoneal mass on 07/10/21 confirmed metastatic adenocarcinoma with necrosis, consistent with colon primary. -FO showed MSI-H disease, BRCA, [name]6, and APC mutations among others which are not targetable. 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  Due to BRCA mutation on FO, she may be a candidate for PARP inhibitor down the line -she progressed on first line FOLFOX (06/22/21 - 08/17/21) and switch to second line pembrolizumab q. 21 days starting 09/08/2021 -Mikayla Rios appears stable. S/p 6 cycles of pembrolizumab/keytruda.  She tolerates treatment well overall with fibromyalgia flares in her joints, she remains able to function well  with adequate performance status overall.  She has intermittent early satiety and constipation she attributes to diet change and adding fiber. -We reviewed her restaging CT CAP from 01/08/2022 decreased size of the mass involving the left psoas muscle, improved omental and ovarian deposits, with slightly increased omental caking in the left lower abdomen.  No new metastatic disease.  Overall consistent with a partial treatment response.  She has improved clinically and CEA normalized on immunotherapy - Patient seen with Dr. Burr Medico who also reviewed the scan and recommends to continue pembrolizumab for now.  Tentative plan to restage in October. -Labs reviewed, CEA is pending. K 3.5, continue oral K once daily. She will increase to 2/day on days she has more GI output. Adequate to proceed with cycle 7 pembrolizumab today as planned -Follow-up in 3 weeks, or sooner if needed   2. Symptom Management: Abdominal pain, Weight loss -she continues to have severe pain at the incision site. She saw Dr. Johney Maine on 06/11/21, who said the pain could last up to a year due to the complexity of her surgery. -Pain management per palliative care   3. Anxiety and depression -Due to the prolonged illness and multiple hospital stay since May 2022, and cancer diagnosis, she has been feeling depressed lately. -cymbalta prescribed 05/25/21 -She had panic attack 2/13 after chemo, when she was reacting to clorhexidine.  -F/up Dr. Michail Sermon -Anxious today about scan, otherwise stable   4. 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 However her MSI was low, instead of high, which is unusual for Lynch syndrome. -FO showed MSI-High disease, and showed multiple other mutations including APC, [name]6, and BRCA mutations.  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  -We previously discussed  genetic testing for her spouse and children  5. Social Support -she is connected with Education officer, [employer] -she is working to apply for disability. Her job ended just prior to the start of her symptoms. -she has three children-- ages 7, 44, and 11.  Plan: -CT CAP and today's labs reviewed -Continue pembrolizumab q3 weeks, proceed with cycle 7 today as planned -Follow-up in 3 weeks, or sooner if needed -Patient seen with Dr. Burr Medico   All questions were answered. The patient knows to call the clinic with any problems, questions or concerns. No barriers to learning were detected.      Alla Feeling, NP 01/12/22   Addendum I have seen the patient, examined her. I agree with the assessment and and plan and have edited the notes.  I have personally reviewed her restaging CT scan images, and I discussed the findings with patient and her husband.  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  She is also clinically doing better, indicating good response to [facility].  I recommend continue current therapy.  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  She is tolerating Keytruda well, except slightly worsening fibromyalgia which is still manageable.  Labs reviewed.  Adequate for therapy. Will continue Keytruda every 3 weeks.  [name]  01/12/2022

## 2022-01-12 ENCOUNTER — Encounter: Payer: Self-pay | Admitting: Nurse Practitioner

## 2022-01-12 ENCOUNTER — Other Ambulatory Visit: Payer: Self-pay

## 2022-01-12 ENCOUNTER — Inpatient Hospital Stay: Payer: BC Managed Care – PPO

## 2022-01-12 ENCOUNTER — Inpatient Hospital Stay: Payer: BC Managed Care – PPO | Attending: Hematology | Admitting: Nurse Practitioner

## 2022-01-12 VITALS — BP 116/73 | HR 84 | Temp 98.5°F | Resp 16

## 2022-01-12 VITALS — BP 131/88 | HR 82 | Temp 99.3°F | Resp 17 | Ht 63.0 in | Wt 187.2 lb

## 2022-01-12 DIAGNOSIS — C187 Malignant neoplasm of sigmoid colon: Secondary | ICD-10-CM

## 2022-01-12 DIAGNOSIS — K5900 Constipation, unspecified: Secondary | ICD-10-CM | POA: Insufficient documentation

## 2022-01-12 DIAGNOSIS — D259 Leiomyoma of uterus, unspecified: Secondary | ICD-10-CM | POA: Insufficient documentation

## 2022-01-12 DIAGNOSIS — Z5112 Encounter for antineoplastic immunotherapy: Secondary | ICD-10-CM | POA: Insufficient documentation

## 2022-01-12 DIAGNOSIS — J90 Pleural effusion, not elsewhere classified: Secondary | ICD-10-CM | POA: Insufficient documentation

## 2022-01-12 DIAGNOSIS — K6819 Other retroperitoneal abscess: Secondary | ICD-10-CM | POA: Diagnosis not present

## 2022-01-12 DIAGNOSIS — I10 Essential (primary) hypertension: Secondary | ICD-10-CM | POA: Diagnosis not present

## 2022-01-12 DIAGNOSIS — Z800 Family history of malignant neoplasm of digestive organs: Secondary | ICD-10-CM | POA: Insufficient documentation

## 2022-01-12 DIAGNOSIS — Z803 Family history of malignant neoplasm of breast: Secondary | ICD-10-CM | POA: Insufficient documentation

## 2022-01-12 DIAGNOSIS — C786 Secondary malignant neoplasm of retroperitoneum and peritoneum: Secondary | ICD-10-CM

## 2022-01-12 DIAGNOSIS — K760 Fatty (change of) liver, not elsewhere classified: Secondary | ICD-10-CM | POA: Insufficient documentation

## 2022-01-12 DIAGNOSIS — Z452 Encounter for adjustment and management of vascular access device: Secondary | ICD-10-CM

## 2022-01-12 DIAGNOSIS — K8020 Calculus of gallbladder without cholecystitis without obstruction: Secondary | ICD-10-CM | POA: Diagnosis not present

## 2022-01-12 DIAGNOSIS — D3502 Benign neoplasm of left adrenal gland: Secondary | ICD-10-CM | POA: Diagnosis not present

## 2022-01-12 DIAGNOSIS — C7972 Secondary malignant neoplasm of left adrenal gland: Secondary | ICD-10-CM | POA: Insufficient documentation

## 2022-01-12 DIAGNOSIS — G4730 Sleep apnea, unspecified: Secondary | ICD-10-CM | POA: Diagnosis not present

## 2022-01-12 DIAGNOSIS — Z79899 Other long term (current) drug therapy: Secondary | ICD-10-CM | POA: Diagnosis not present

## 2022-01-12 DIAGNOSIS — D649 Anemia, unspecified: Secondary | ICD-10-CM

## 2022-01-12 DIAGNOSIS — M797 Fibromyalgia: Secondary | ICD-10-CM | POA: Diagnosis not present

## 2022-01-12 DIAGNOSIS — K5732 Diverticulitis of large intestine without perforation or abscess without bleeding: Secondary | ICD-10-CM | POA: Insufficient documentation

## 2022-01-12 LAB — CMP (CANCER CENTER ONLY)
ALT: 7 U/L (ref 0–44)
AST: 12 U/L — ABNORMAL LOW (ref 15–41)
Albumin: 3.8 g/dL (ref 3.5–5.0)
Alkaline Phosphatase: 106 U/L (ref 38–126)
Anion gap: 8 (ref 5–15)
BUN: 12 mg/dL (ref 6–20)
CO2: 27 mmol/L (ref 22–32)
Calcium: 9.2 mg/dL (ref 8.9–10.3)
Chloride: 103 mmol/L (ref 98–111)
Creatinine: 0.83 mg/dL (ref 0.44–1.00)
GFR, Estimated: >60 mL/min (ref >60)
Glucose, Bld: 99 mg/dL (ref 70–99)
Potassium: 3.5 mmol/L (ref 3.5–5.1)
Sodium: 138 mmol/L (ref 135–145)
Total Bilirubin: 0.2 mg/dL — ABNORMAL LOW (ref 0.3–1.2)
Total Protein: 8.1 g/dL (ref 6.5–8.1)

## 2022-01-12 LAB — CBC WITH DIFFERENTIAL (CANCER CENTER ONLY)
Abs Immature Granulocytes: 0.06 10*3/uL (ref 0.00–0.07)
Basophils Absolute: 0 10*3/uL (ref 0.0–0.1)
Basophils Relative: 0 %
Eosinophils Absolute: 0.1 10*3/uL (ref 0.0–0.5)
Eosinophils Relative: 1 %
HCT: 27.8 % — ABNORMAL LOW (ref 36.0–46.0)
Hemoglobin: 8.8 g/dL — ABNORMAL LOW (ref 12.0–15.0)
Immature Granulocytes: 1 %
Lymphocytes Relative: 15 %
Lymphs Abs: 1.5 10*3/uL (ref 0.7–4.0)
MCH: 23.8 pg — ABNORMAL LOW (ref 26.0–34.0)
MCHC: 31.7 g/dL (ref 30.0–36.0)
MCV: 75.1 fL — ABNORMAL LOW (ref 80.0–100.0)
Monocytes Absolute: 0.5 10*3/uL (ref 0.1–1.0)
Monocytes Relative: 5 %
Neutro Abs: 7.7 10*3/uL (ref 1.7–7.7)
Neutrophils Relative %: 78 %
Platelet Count: 654 10*3/uL — ABNORMAL HIGH (ref 150–400)
RBC: 3.7 MIL/uL — ABNORMAL LOW (ref 3.87–5.11)
RDW: 17.2 % — ABNORMAL HIGH (ref 11.5–15.5)
WBC Count: 9.8 10*3/uL (ref 4.0–10.5)
nRBC: 0 % (ref 0.0–0.2)

## 2022-01-12 LAB — IRON AND IRON BINDING CAPACITY (CC-WL,HP ONLY)
Iron: 17 ug/dL — ABNORMAL LOW (ref 28–170)
Saturation Ratios: 8 % — ABNORMAL LOW (ref 10.4–31.8)
TIBC: 202 ug/dL — ABNORMAL LOW (ref 250–450)
UIBC: 185 ug/dL (ref 148–442)

## 2022-01-12 LAB — TSH: TSH: 2.757 u[IU]/mL (ref 0.350–4.500)

## 2022-01-12 LAB — FERRITIN: Ferritin: 130 ng/mL (ref 11–307)

## 2022-01-12 LAB — CEA (IN HOUSE-CHCC): CEA (CHCC-In House): <1 ng/mL (ref 0.00–5.00)

## 2022-01-12 LAB — PREGNANCY, URINE: Preg Test, Ur: NEGATIVE

## 2022-01-12 MED ORDER — SODIUM CHLORIDE 0.9% FLUSH
10.0000 mL | Freq: Once | INTRAVENOUS | Status: AC
  Administered 2022-01-12: 10 mL

## 2022-01-12 MED ORDER — HEPARIN SOD (PORK) LOCK FLUSH 100 UNIT/ML IV SOLN
500.0000 [IU] | Freq: Once | INTRAVENOUS | Status: AC | PRN
  Administered 2022-01-12: 500 [IU]

## 2022-01-12 MED ORDER — SODIUM CHLORIDE 0.9 % IV SOLN
200.0000 mg | Freq: Once | INTRAVENOUS | Status: AC
  Administered 2022-01-12: 200 mg via INTRAVENOUS
  Filled 2022-01-12: qty 200

## 2022-01-12 MED ORDER — SODIUM CHLORIDE 0.9 % IV SOLN: Freq: Once | INTRAVENOUS | Status: AC

## 2022-01-12 MED ORDER — SODIUM CHLORIDE 0.9% FLUSH
10.0000 mL | INTRAVENOUS | Status: DC | PRN
  Administered 2022-01-12: 10 mL

## 2022-01-12 NOTE — Patient Instructions (Signed)
[facility] MEDICAL ONCOLOGY   Discharge Instructions: Thank you for choosing [facility] to provide your oncology and hematology care.   If you have a lab appointment with the Cancer Center, please go directly to the Cancer Center and check in at the registration area.   Wear comfortable clothing and clothing appropriate for easy access to any Portacath or PICC line.   We strive to give you quality time with your provider. You may need to reschedule your appointment if you arrive late (15 or more minutes).  Arriving late affects you and other patients whose appointments are after yours.  Also, if you miss three or more appointments without notifying the office, you may be dismissed from the clinic at the provider's discretion.      For prescription refill requests, have your pharmacy contact our office and allow 72 hours for refills to be completed.    Today you received the following chemotherapy and/or immunotherapy agents: Pembrolizumab (Keytruda)      To help prevent nausea and vomiting after your treatment, we encourage you to take your nausea medication as directed.  BELOW ARE SYMPTOMS THAT SHOULD BE REPORTED IMMEDIATELY: *FEVER GREATER THAN 100.4 F (38 C) OR HIGHER *CHILLS OR SWEATING *NAUSEA AND VOMITING THAT IS NOT CONTROLLED WITH YOUR NAUSEA MEDICATION *UNUSUAL SHORTNESS OF BREATH *UNUSUAL BRUISING OR BLEEDING *URINARY PROBLEMS (pain or burning when urinating, or frequent urination) *BOWEL PROBLEMS (unusual diarrhea, constipation, pain near the anus) TENDERNESS IN MOUTH AND THROAT WITH OR WITHOUT PRESENCE OF ULCERS (sore throat, sores in mouth, or a toothache) UNUSUAL RASH, SWELLING OR PAIN  UNUSUAL VAGINAL DISCHARGE OR ITCHING   Items with * indicate a potential emergency and should be followed up as soon as possible or go to the Emergency Department if any problems should occur.  Please show the CHEMOTHERAPY ALERT CARD or IMMUNOTHERAPY ALERT  CARD at check-in to the Emergency Department and triage nurse.  Should you have questions after your visit or need to cancel or reschedule your appointment, please contact [facility] MEDICAL [facility]COL[facility]  Dept: [phone]  and follow the prompts.  Office hours are 8:00 a.m. to 4:30 p.m. Monday - Friday. Please note that voicemails left after 4:00 p.m. may not be returned until the following business day.  We are closed weekends and major holidays. You have access to a nurse at all times for urgent questions. Please call the main number to the clinic Dept: [phone] and follow the prompts.   For any non-urgent questions, you may also contact your provider using MyChart. We now offer e-Visits for anyone 18 and older to request care online for non-urgent symptoms. For details visit [email or web address].   Also download the MyChart app! Go to the app store, search "MyChart", open the app, select [facility], and log in with your MyChart username and password.  Masks are optional in the cancer centers. If you would like for your care team to wear a mask while they are taking care of you, please let them know. You may have one support person who is at least 38 years old accompany you for your appointments. 

## 2022-01-13 ENCOUNTER — Telehealth: Payer: Self-pay | Admitting: Hematology

## 2022-01-13 ENCOUNTER — Encounter: Payer: Self-pay | Admitting: Hematology

## 2022-01-13 ENCOUNTER — Encounter: Payer: Self-pay | Admitting: Nurse Practitioner

## 2022-01-13 LAB — T4: T4, Total: 9.5 ug/dL (ref 4.5–12.0)

## 2022-01-13 NOTE — Telephone Encounter (Signed)
Left message with follow-up appointments per 8/8 los.

## 2022-01-25 NOTE — Progress Notes (Signed)
[place] OFFICE PROGRESS NOTE  [name], [address] [place] [address]  DIAGNOSIS: Follow up colon cancer   Oncology History Overview Note   Cancer Staging  Malignant neoplasm of sigmoid colon [facility]) Staging form: Colon and Rectum, AJCC 8th Edition - Pathologic stage from 04/15/2021: Stage IIC (pT4b, pN0, cM0) - Signed by [name], MD on 06/12/2021    Malignant neoplasm of sigmoid colon [facility])   Initial Diagnosis   Malignant neoplasm of sigmoid colon ([place])   11/04/2020 Imaging   EXAM: CT ABDOMEN AND PELVIS WITHOUT CONTRAST  IMPRESSION: 1. Perforating descending colonic diverticulitis with multiple abdominopelvic gas and fluid collections, measuring up to 5.7 cm and further described above. 2. Cholelithiasis without findings of acute cholecystitis. 3. 3.6 cm benign left adrenal adenoma. 4. Leiomyomatous uterus. 5. 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   11/07/2020 Imaging   EXAM: CT ABDOMEN AND PELVIS WITHOUT CONTRAST  IMPRESSION: Continued wall thickening is seen involving descending colon suggesting infectious or inflammatory colitis or perforated diverticulitis. There is an adjacent fluid collection measuring 7.0 x 5.0 cm consistent with abscess which is significantly enlarged compared to prior exam. Adjacent to the abscess, there is a severely thickened small bowel loop most consistent with secondary inflammation.   5.2 x 3.5 cm fluid collection is noted within the left psoas muscle consistent with abscess which is significantly enlarged compared to prior exam. 4.4 x 3.8 cm fluid collection consistent with abscess is noted in the left retroperitoneal region which is also enlarged compared to prior exam.   Fibroid uterus.   Cholelithiasis.   11/27/2020 Imaging   EXAM: CT ABDOMEN AND PELVIS WITHOUT CONTRAST  IMPRESSION: 1.  Significantly interval decreased size of the previously visualized left retroperitoneal and left anterior mid abdominal fluid collections. There is persistent fat stranding and mural thickening of the mid descending colon at this level. 2. Unchanged leiomyomatous uterus. 3. Unchanged cholelithiasis.   12/11/2020 Imaging   EXAM: CT ABDOMEN AND PELVIS WITHOUT CONTRAST  IMPRESSION: 1. Stable inflammatory changes centered around the distal descending colon in the left lower abdomen. Pericolonic inflammatory changes have minimally changed since 11/27/2020. Small pocket of gas medial to the colon is probably associated with a fistula or small residual abscess collection. 2. Stable position of the two percutaneous drains. The more anterior drain may be extending through a portion of the small bowel. 3. Cholelithiasis. 4. Fibroid uterus. Cannot exclude an ovarian/adnexal cystic structure near the uterine fundus. 5. Left adrenal adenoma.   01/07/2021 Imaging   EXAM: CT ABDOMEN AND PELVIS WITH CONTRAST  IMPRESSION: 1. No new abscesses identified. Similar degree of soft tissue thickening seen in the left pericolonic region. The degree of persistent soft tissues thickening in the pericolonic space further raises suspicions for malignancy. Further evaluation with colonoscopy should be performed. 2. Left retroperitoneal abscess drain unchanged in position. Anterior left abdominal drain again seen terminating within small bowel loop. 3. 2.6 cm mildly sclerotic lesion noted in the L2 vertebral body. Further evaluation with contrast enhanced lumbar spine MRI should be performed.   02/04/2021 Imaging   EXAM: CT ABDOMEN AND PELVIS WITH CONTRAST  IMPRESSION: No new abdominopelvic collections or abscess development in the 1 month interval.   Left anterior drain remains within a loop of small bowel, unchanged.   Left lateral abscess drain remains in the retroperitoneal space adjacent to the  iliopsoas muscle  with a small amount of residual fluid and air but no measurable collection.   Stable soft tissue prominence and pericolonic strandy edema/inflammation about the left descending colon compatible with residual diverticulitis/colitis. Difficult to exclude underlying transmural lesion.   04/01/2021 Imaging   EXAM: CT ABDOMEN AND PELVIS WITH CONTRAST  IMPRESSION: There appears to be significant enhancement and wall thickening involving the descending colon with some degree of traction and involvement of adjacent small bowel loops. This is consistent with the history of diverticulitis and perforation. Stable position of percutaneous drainage catheter is seen adjacent to left psoas muscle with no significant residual fluid remaining. The other percutaneous drainage catheter that was previously noted to be within small bowel loop on prior exam in the left lower quadrant, has significantly retracted and appears to be outside of the peritoneal space at this time.   Since the prior exam, there does appear to be some degree of rotation involving mesenteric vessels and structures in the right lower quadrant, suggesting partial volvulus or malrotation. There is the interval development of several lymph nodes in this area, most likely inflammatory or reactive in etiology. Mild amount of free fluid is also noted in the pelvis. However, no significant bowel wall thickening or dilatation is seen in this area. These results will be called to the ordering clinician or representative by the Radiologist Assistant, and communication documented in the PACS or zVision Dashboard.   Stable uterine fibroid.   Hepatic steatosis.   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   Cholelithiasis.   04/10/2021 Imaging   EXAM: CT ANGIOGRAPHY CHEST CT ABDOMEN AND PELVIS WITH CONTRAST  IMPRESSION: 1. Again seen are findings compatible with descending colon diverticulitis with perforation. Free air and  inflammation have increased in the interval. 2. New lobulated enhancing fluid collection posterior to this segment of inflamed colon measuring 8.5 x 3.5 x 10.0 cm. This collection now invades the adjacent iliopsoas muscle as well as extends through the left lateral abdominal wall. The tip of the drainage catheter is in this collection. Findings are compatible with abscess. 3. Anterior left percutaneous drainage catheter tip has been pulled back and is now within the subcutaneous tissues. 4. Trace free fluid. 5. No pulmonary embolism.  No acute cardiopulmonary process. 6. Cholelithiasis. 7. Fatty infiltration of the liver.   04/15/2021 Definitive Surgery   FINAL MICROSCOPIC DIAGNOSIS:   A. SMALL BOWEL, RESECTION:  - Adenocarcinoma.  - No carcinoma identified in 1 lymph node.   B. PERFORATED LEFT COLON, RESECTION:  - Moderately differentiated colonic adenocarcinoma.  - Tumor extends into pericolonic adipose tissue, and is strongly  suggestive of invasion into small bowel.  See oncology table/comments.  - No carcinoma identified in 4 lymph nodes (0/4).  - Tubular adenoma with high-grade dysplasia, 1.  - Tubular adenomas with low grade dysplasia, 3.   Comments: The size of the tumor is difficult to estimate secondary to the disrupted nature of the specimen, as well as the infiltrative nature of the tumor.  Tumor can be identified as definitely invading into the pericolonic adipose tissue (block B4), but given the extreme disruption of the tissue, and the involvement of the small bowel by what appears to be a colonic adenocarcinoma, I favor perforation of the large bowel with direct invasion into the small bowel.  Accordingly, I believe this is best regarded as a pT4b lesion.   ADDENDUM:  Mismatch Repair Protein (IHC)  SUMMARY INTERPRETATION: ABNORMAL  There is loss of the major MMR protein [name]6: This  indicates a high probability that a hereditary germline mutation is present and  referral to genetic counseling is warranted. It is recommended that the loss of protein expression be correlated with molecular based microsatellite instability testing.   IHC EXPRESSION RESULTS  TEST           RESULT  MLH1:          Preserved nuclear expression  [name]2:          Preserved nuclear expression  [name]6:          LOSS OF NUCLEAR EXPRESSION  PMS2:          Preserved nuclear expression    04/15/2021 Cancer Staging   Staging form: Colon and Rectum, AJCC 8th Edition - Pathologic stage from 04/15/2021: Stage IIC (pT4b, pN0, cM0) - Signed by [name], MD on 06/12/2021 Stage prefix: Initial diagnosis Total positive nodes: 0 Histologic grading system: 4 grade system Histologic grade (G): G2 Residual tumor (R): R0 - None   04/29/2021 Imaging   EXAM: CT ABDOMEN AND PELVIS WITH CONTRAST  IMPRESSION: Continued left retroperitoneal abscesses inferior to the left kidney, involving the left psoas muscle and left abdominal wall musculature. Overall size is decreased since prior study. Interval removal of left lower quadrant abscess drainage catheter.   New fluid collection in the cul-de-sac and wrapping around the uterus concerning for abscess.   Cholelithiasis.   Small left pleural effusion.  Bibasilar atelectasis.   Stable left adrenal adenoma.  ADDENDUM: After discussing the case with Dr. Dwaine Gale in interventional radiology, it was noted that the fluid in the pelvis originally thought to be in the cul-de-sac is likely within the vagina, best seen on sagittal imaging. Recommend speculum exam.   Also, in the left lateral wall in the area of prior abscess in the lateral wall abdominal musculature, some of the abdomen soft tissue appears to be enhancing. While this could be infectious, cannot exclude tumor seeding in the left lateral abdominal wall, measuring 6.9 x 3.8 cm on image 64 of series 2.   06/16/2021 Imaging   EXAM: CT ABDOMEN AND PELVIS WITH  CONTRAST  IMPRESSION: Increased size of bulky soft tissue masses involving the left psoas muscle and left lateral abdominal wall soft tissues, consistent with progressive metastatic disease.   Significant progression of multiple peritoneal masses throughout the abdomen and pelvis, consistent with peritoneal carcinomatosis.   New mild retroperitoneal lymphadenopathy, consistent with metastatic disease.   Stable uterine fibroid and left adrenal adenoma.   Cholelithiasis, without evidence of cholecystitis.   06/22/2021 - 08/19/2021 Chemotherapy   Patient is on Treatment Plan : COLORECTAL FOLFOX q14d x 6 months     06/22/2021 Tumor Marker   Patient's tumor was tested for the following markers: CEA. Results of the tumor marker test revealed 87.41.   07/10/2021 Pathology Results   FINAL MICROSCOPIC DIAGNOSIS:   A. PERITONEAL MASS, LEFT UPPER ABDOMINAL QUADRANT, BIOPSY:  -  Metastatic adenocarcinoma with necrosis, histologically consistent with colon primary.     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 The report date is 07/16/2021. The remainder of testing was negative/normal.  The CancerNext-Expanded + RNAinsight gene panel offered by [facility] and includes sequencing and rearrangement analysis for the following 77 genes: IP, ALK, APC*, ATM*, AXIN2, BAP1, BARD1, BLM, BMPR1A, BRCA1*, BRCA2*, BRIP1*, CDC73, CDH1*,CDK4, CDKN1B, CDKN2A, CHEK2*, CTNNA1, DICER1, FANCC, FH, FLCN, GALNT12, KIF1B, LZTR1, MAX, MEN1, MET, MLH1*, [name]2*, [name]3, [name]6*, MUTYH*, NBN, NF1*, NF2,  NTHL1, PALB2*, PHOX2B, PMS2*, POT1, PRKAR1A, PTCH1, PTEN*, RAD51C*, RAD51D*,RB1, RECQL, RET, SDHA, SDHAF2, SDHB, SDHC, SDHD, SMAD4, SMARCA4, SMARCB1, SMARCE1, STK11, SUFU, TMEM127, TP53*,TSC1, TSC2, VHL and XRCC2 (sequencing and deletion/duplication); EGFR, EGLN1, HOXB13, KIT, MITF, PDGFRA, POLD1 and POLE (sequencing only); EPCAM and GREM1  (deletion/duplication only).   08/28/2021 Imaging   EXAM: CT CHEST, ABDOMEN, AND PELVIS WITH CONTRAST  IMPRESSION: 1. Continued interval progression of multiple metastatic soft tissue nodules/masses in the abdomen and pelvis. 2. No evidence for metastatic disease in the chest. 3. Similar appearance of the subtle lesion in the L2 vertebral body, suspicious for metastatic involvement. 4. Cholelithiasis.   09/08/2021 -  Chemotherapy   Patient is on Treatment Plan : COLORECTAL Pembrolizumab (200) q21d     01/08/2022 Imaging   CT CAP IMPRESSION: 1. Mixed response to therapy. Interval decrease in size of the conglomerate multilobulated mass extending from the left psoas into the left posterior pararenal space through the left lateral abdominal wall along the tract of the previously placed percutaneous drainage catheter. Interval decrease in size of right adnexal mass and omental masses within the right lower quadrant of the abdomen. Interval development of peritoneal carcinomatosis with omental caking within the left lower quadrant of the abdomen and left subdiaphragmatic region adjacent to the spleen and along the left pericolic gutter as well as development of ascites appearing loculated within the anterior peritoneum. 2. Interval development of mild left hydronephrosis secondary to stricturing of the mid left ureter. 3. Stable left adrenal metastasis. 4. Stable sclerotic lesion within the L2 vertebral body. No new lytic or blastic bone lesions are seen. 5. Cholelithiasis. 6. No evidence of intrathoracic metastatic disease. 7. 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 Moderate colonic stool burden. No evidence of obstruction.     CURRENT THERAPY: Pembrolizumab/Keytruda q21 days, starting 09/08/21  INTERVAL HISTORY: Mikayla Rios 36 y.o. female returns to the clinic today for a follow-up visit.  She is accompanied by  her husband.  The patient is currently undergoing treatment with immunotherapy with Keytruda.  She tolerates this well without any concerning adverse side effects except she has some flareup in her joint pain secondary to her fibromyalgia.  Overall this is tolerable. She notices movement helps her symptoms.  She does have a prescription for Dilaudid.  Her rehab provider also increase the dose of her Cymbalta.  She tries not to take these unless needed.  She mentions that her therapist also recommended topical CBD.  She has not tried that.  Otherwise she denies any fever, chills, or night sweats.  She is being followed by member the nutritionist team due to early satiety.  She does have an appetite.  She is expected to see them in the infusion room today.  She reports her constipation has been improving because she has been increasing her fiber intake.  Denies any nausea, vomiting, or diarrhea.  She sometimes has discomfort near the stoma site.  She reports that her last scan showed a hernia and she is wondering where the hernia is and if it is near her stoma.  The scan showed mild fat-containing hernia parastomal.  She reports the area sometimes feels sore like she has been doing sit ups.  She also reports that her ostomy clinic recently gave her different bags.  Since having the different back she has some erythema around where her ostomy bag sits.  No known allergies to adhesive.  The patient reports sometimes this  area is itchy.  She is planning on calling the ostomy clinic. Denies any chest pain, shortness of breath, cough, or hemoptysis.  Denies any rashes or skin changes besides the skin irritation near the ostomy bag site.  She also mentions she received iron infusions previously for anemia.  She reports similar fatigue.  She is here today for evaluation and repeat blood work before undergoing cycle #9.   MEDICAL HISTORY: Past Medical History:  Diagnosis Date   Allergy    seasonal   Anemia    Blood  infection 1985   Blood transfusion without reported diagnosis    Diverticulitis    Family history of breast cancer    Family history of colon cancer    Family history of stomach cancer    Hypertension    Obesity    Sleep apnea     ALLERGIES:  is allergic to chlorhexidine gluconate, shellfish allergy, and penicillins.  MEDICATIONS:  Current Outpatient Medications  Medication Sig Dispense Refill   amLODipine (NORVASC) 10 MG tablet Take 10 mg by mouth every evening.     DULoxetine (CYMBALTA) 60 MG capsule TAKE 1 CAPSULE BY MOUTH EVERY DAY 90 capsule 2   HYDROmorphone (DILAUDID) 2 MG tablet Take 1 tablet (2 mg total) by mouth every 6 (six) hours as needed for severe pain. 30 tablet 0   metoprolol tartrate 75 MG TABS Take 75 mg by mouth 2 (two) times daily. 90 tablet 3   potassium chloride 20 MEQ/15ML (10%) SOLN Take 45 mLs (60 mEq total) by mouth daily. 473 mL 3   POTASSIUM PO Take by mouth.     No current facility-administered medications for this visit.   Facility-Administered Medications Ordered in Other Visits  Medication Dose Route Frequency Provider Last Rate Last Admin   fentaNYL (SUBLIMAZE) injection   Intravenous PRN Mir, Paula Libra, MD   50 mcg at 10/02/21 1436   midazolam (VERSED) injection   Intravenous PRN Mir, Paula Libra, MD   1 mg at 10/02/21 1436    SURGICAL HISTORY:  Past Surgical History:  Procedure Laterality Date   COLECTOMY WITH COLOSTOMY CREATION/HARTMANN PROCEDURE N/A 04/15/2021   Procedure: COLOSTOMY CREATION/HARTMANN PROCEDURE;  Surgeon: [name], MD;  Location: [facility];  Service: General;  Laterality: N/A;   IR CATHETER TUBE CHANGE  12/12/2020   IR CATHETER TUBE CHANGE  01/27/2021   IR CATHETER TUBE CHANGE  02/19/2021   IR CATHETER TUBE CHANGE  04/13/2021   IR IMAGING GUIDED PORT INSERTION  10/02/2021   IR RADIOLOGIST EVAL & MGMT  11/27/2020   IR RADIOLOGIST EVAL & MGMT  12/11/2020   IR RADIOLOGIST EVAL & MGMT  01/07/2021   IR RADIOLOGIST EVAL & MGMT   02/04/2021   IR SINUS/FIST TUBE CHK-NON GI  12/12/2020   IR SINUS/FIST TUBE CHK-NON GI  02/19/2021   LAPAROSCOPIC PARTIAL COLECTOMY N/A 04/15/2021   Procedure: LAPAROSCOPIC ASSISTED HARTMANN RESECTION;  Surgeon: [name], MD;  Location: [facility];  Service: General;  Laterality: N/A;    REVIEW OF SYSTEMS:   Review of Systems  Constitutional: Positive for baseline fatigue.  Positive for early satiety.  Negative for appetite change, chills, fever and unexpected weight change.  HENT: Negative for mouth sores, nosebleeds, sore throat and trouble swallowing.   Eyes: Negative for eye problems and icterus.  Respiratory: Negative for cough, hemoptysis, shortness of breath and wheezing.   Cardiovascular: Negative for chest pain and leg swelling.  Gastrointestinal: Improving constipation. Positive for soreness around ostomy site. Negative for  diarrhea, nausea and vomiting.  Genitourinary: Negative for bladder incontinence, difficulty urinating, dysuria, frequency and hematuria.   Musculoskeletal: Negative for back pain, gait problem, neck pain and neck stiffness.  Skin: Positive for skin irritation and itching near ostomy adhesive site Neurological: Negative for dizziness, extremity weakness, gait problem, headaches, light-headedness and seizures.  Hematological: Negative for adenopathy. Does not bruise/bleed easily.  Psychiatric/Behavioral: Negative for confusion, depression and sleep disturbance. The patient is not nervous/anxious.     PHYSICAL EXAMINATION:  Blood pressure (!) 132/92, pulse 77, temperature 98.1 F (36.7 C), temperature source Tympanic, resp. rate 18, height 5' 3" (1.6 m), weight 185 lb 1.6 oz (84 kg), SpO2 100 %.  ECOG PERFORMANCE STATUS: 1  Physical Exam  Constitutional: Oriented to person, place, and time and well-developed, well-nourished, and in no distress.  HENT:  Head: Normocephalic and atraumatic.  Mouth/Throat: Oropharynx is clear and moist. No oropharyngeal  exudate.  Eyes: Conjunctivae are normal. Right eye exhibits no discharge. Left eye exhibits no discharge. No scleral icterus.  Neck: Normal range of motion. Neck supple.  Cardiovascular: Normal rate, regular rhythm, normal heart sounds and intact distal pulses.   Pulmonary/Chest: Effort normal and breath sounds normal. No respiratory distress. No wheezes. No rales.  Abdominal: Soft. Bowel sounds are normal. Exhibits no distension and no mass. There is no tenderness.  Mild skin irritation slightly beyond ostomy adhesive site. Musculoskeletal: Normal range of motion. Exhibits no edema.  Lymphadenopathy:    No cervical adenopathy.  Neurological: Alert and oriented to person, place, and time. Exhibits normal muscle tone. Gait normal. Coordination normal.  Skin: Skin is warm and dry. No rash noted. Not diaphoretic. No erythema. No pallor.  Psychiatric: Mood, memory and judgment normal.  Vitals reviewed.  LABORATORY DATA: Lab Results  Component Value Date   WBC 7.8 02/01/2022   HGB 8.3 (L) 02/01/2022   HCT 26.6 (L) 02/01/2022   MCV 75.8 (L) 02/01/2022   PLT 563 (H) 02/01/2022      Chemistry      Component Value Date/Time   NA 138 01/12/2022 0918   NA 139 12/21/2019 1024   K 3.5 01/12/2022 0918   CL 103 01/12/2022 0918   CO2 27 01/12/2022 0918   BUN 12 01/12/2022 0918   BUN 13 12/21/2019 1024   CREATININE 0.83 01/12/2022 0918      Component Value Date/Time   CALCIUM 9.2 01/12/2022 0918   ALKPHOS 106 01/12/2022 0918   AST 12 (L) 01/12/2022 0918   ALT 7 01/12/2022 0918   BILITOT 0.2 (L) 01/12/2022 0918       RADIOGRAPHIC STUDIES:  CT CHEST ABDOMEN PELVIS W CONTRAST  Result Date: 01/11/2022 CLINICAL DATA:  Metastatic colon cancer, ongoing chemotherapy, assess treatment response EXAM: CT CHEST, ABDOMEN, AND PELVIS WITH CONTRAST TECHNIQUE: Multidetector CT imaging of the chest, abdomen and pelvis was performed following the standard protocol during bolus administration of  intravenous contrast. RADIATION DOSE REDUCTION: This exam was performed according to the departmental dose-optimization program which includes automated exposure control, adjustment of the mA and/or kV according to patient size and/or use of iterative reconstruction technique. CONTRAST:  138m OMNIPAQUE IOHEXOL 300 MG/ML  SOLN COMPARISON:  10/07/2021, 08/28/2021 FINDINGS: CT CHEST FINDINGS Cardiovascular: Mild coronary artery calcification. Global cardiac size within normal limits. No pericardial effusion. Central pulmonary arteries are of normal caliber. Thoracic aorta is unremarkable. Right internal jugular chest port tip is seen within the superior cavoatrial junction. Mediastinum/Nodes: Visualized thyroid is unremarkable. No pathologic thoracic adenopathy. Esophagus is unremarkable.  Lungs/Pleura: Mild bibasilar atelectasis. No confluent pulmonary infiltrates or focal pulmonary nodule identified. No pneumothorax or pleural effusion. Central airways are widely patent. Musculoskeletal: No acute bone abnormality. No lytic or blastic bone lesion within the thorax. CT ABDOMEN PELVIS FINDINGS Hepatobiliary: Cholelithiasis again noted without pericholecystic inflammatory change. Liver unremarkable. No intra or extrahepatic biliary ductal dilation. Pancreas: Unremarkable Spleen: Capsular lesion along the dome of the spleen is not well visualized on the current examination, possibly related to the phase of contrast administration. The spleen is normal in size. Adrenals/Urinary Tract: Left adrenal mass, compatible with metastatic disease, appears stable measuring 3.5 cm in greatest dimension. Right adrenal gland is unremarkable. The kidneys are normal in size and position. There has developed mild left hydronephrosis secondary to in case mint and stricturing of the mid left ureter, best seen on axial image # 83/2. No hydronephrosis on the right. No intrarenal or ureteral calculi. Stable simple cortical cyst arises from the  lower pole of the left kidney. The bladder is unremarkable. 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 Small fat and fluid containing parastomal hernia noted. The stomach, small bowel, and large bowel are otherwise unremarkable there is no evidence of obstruction or focal inflammation. Moderate colonic stool burden is noted, however. No free intraperitoneal gas. Increasing somewhat loculated appearing ascites noted within the abdomen inferiorly. There is development of peritoneal carcinomatosis with omental caking best appreciated within the left lower quadrant anteriorly at axial image # 85/2 as well as within the left subdiaphragmatic region adjacent to the spleen and along the left pericolic gutter. Vascular/Lymphatic: The abdominal vasculature is unremarkable. Soft tissue within the left periaortic region extending into the retroperitoneal soft tissues the level of the aortic bifurcation likely represents a conglomerate nodal mass and appears progressive since prior examination measuring 17 mm x 20 mm at axial image # 73/2. No additional pathologic adenopathy within the abdomen and pelvis. Reproductive: Bilateral multilocular cystic ovarian masses are again identified suspicious for metastatic ovarian implants. The left mass appears stable in size when accounting for changes in slice selection measuring 6.1 x 6.6 cm at axial image # 89/2. The right mass has decreased in size, measuring 3.2 x 5.5 cm at axial image # 91/2. Involuted uterine fibroid within the uterine fundus. Other: The conglomerate multilobulated mass extending from the left psoas into the left posterior pararenal space through the left lateral abdominal wall along the tract of the previously placed percutaneous drainage catheter has significantly decreased in size and demonstrates increasing dystrophic calcification in keeping with interval response to therapy. When measured in  similar fashion, the psoas component measures roughly 3.9 x 3.8 cm at axial image # 80/2. The more peripheral component within the left lateral abdominal wall now measures 3.0 x 10.0 cm at axial image # 78/2. Previously identified soft tissue masses within the low right adnexal has decreased in size and is not well delineated on the current examination precluding measurement. Omental masses within the right lower quadrant of the abdomen havel decreased in size. Index mass at axial image # 75/2 measures 16 x 17 mm additional masses previously mention in this region are no longer visualized Musculoskeletal: Sclerotic lesion within the L2 vertebral body appears stable since prior examination. No new lytic or blastic bone lesions are seen. No acute bone abnormality. IMPRESSION: 1. Mixed response to therapy. Interval decrease in size of the conglomerate multilobulated mass extending from the left psoas into the left posterior pararenal space through the  left lateral abdominal wall along the tract of the previously placed percutaneous drainage catheter. Interval decrease in size of right adnexal mass and omental masses within the right lower quadrant of the abdomen. Interval development of peritoneal carcinomatosis with omental caking within the left lower quadrant of the abdomen and left subdiaphragmatic region adjacent to the spleen and along the left pericolic gutter as well as development of ascites appearing loculated within the anterior peritoneum. 2. Interval development of mild left hydronephrosis secondary to stricturing of the mid left ureter. 3. Stable left adrenal metastasis. 4. Stable sclerotic lesion within the L2 vertebral body. No new lytic or blastic bone lesions are seen. 5. Cholelithiasis. 6. No evidence of intrathoracic metastatic disease. 7. 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 Moderate colonic stool burden. No  evidence of obstruction. Electronically Signed   By: Fidela Salisbury M.D.   On: 01/11/2022 03:43     ASSESSMENT/PLAN:  Mikayla Rios is a 36 y.o. female with    1. 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 She was found to have diverticulitis with perforation. She also developed an abscess requiring 2 drains.  -she developed worsening weakness and was admitted on 04/10/21. She was taken for emergent small bowel and left colon resection on 04/15/21 under Dr. Johney Maine. 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 Margins negative for invasive carcinoma, but low-grade dysplasia involves proximal margin. All 5 lymph nodes negative (0/5). MSI low. -repeat CT AP on 06/16/21 showed progressive metastatic disease involving soft tissue masses in left psoas muscle and left lateral abdominal wall and peritoneal masses, and new mild retroperitoneal lymphadenopathy.  -baseline CEA on 06/22/21 was elevated at 87.41. -biopsy of the LUQ peritoneal mass on 07/10/21 confirmed metastatic adenocarcinoma with necrosis, consistent with colon primary. -FO showed MSI-H disease, BRCA, [name]6, and APC mutations among others which are not targetable. 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  Due to BRCA mutation on FO, she may be a candidate for PARP inhibitor down the line -she progressed on first line FOLFOX (06/22/21 - 08/17/21) and switch to second line pembrolizumab q. 21 days starting 09/08/2021 -Ms. Caine appears stable. S/p 7 cycles of pembrolizumab/keytruda.  She tolerates treatment well overall with fibromyalgia flares in her joints, she remains able to function well with adequate performance status overall.  She has intermittent early satiety and constipation she attributes to diet change.    --CT CAP from 01/08/2022 decreased size of the mass involving the left psoas muscle, improved omental and ovarian deposits, with slightly increased omental caking in the left lower abdomen.  No new metastatic disease.  Overall consistent with a partial treatment response.  She has improved clinically and CEA normalized on immunotherapy -Today, the patient is stable. Labs were reviewed. Continues to have some anemia with Hbg 8.3. Her iron studies are pending.  We will be in touch with the patient if she requires additional IV iron.  Recommend that she proceed with [facility] today as schedule. -She will come back in 3 weeks for evaluation and repeat blood work for starting cycle #9.    2. Symptom Management: Abdominal pain, Weight loss -she continued to have severe pain at the incision site. She saw Dr. Johney Maine on 06/11/21, who said the pain could last up  to a year due to the complexity of her surgery. -Pain management per palliative care -Symptoms are stable today.     3. Anxiety and depression -Due to the prolonged illness and multiple hospital stay since May 2022, and cancer diagnosis, she has been feeling depressed. -cymbalta prescribed 05/25/21 -She had panic attack 2/13 after chemo, when she was reacting to clorhexidine.  -F/up Dr. Michail Sermon -Patient mentions today that her Cymbalta was recently increased.    4. 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 However her MSI was low, instead of high, which is unusual for Lynch syndrome. -FO showed MSI-High disease, and showed multiple other mutations including APC, [name]6, and BRCA mutations.  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  -Dr. Burr Medico previously discussed genetic testing for her spouse and children   5. Social Support -she is connected with [employer], [employer] -she is working to apply for  disability. Her job ended just prior to the start of her symptoms. -she has three children-- ages 68, 52, and 62. -Today (02/01/2022) patient is going to turn in [facility] paperwork.  Patient was given a handout for the contact information to the FMLA/managed care department at our clinic.   Plan: -Continue pembrolizumab q3 weeks, proceed with cycle 8 today as planned -Follow-up in 3 weeks, or sooner if needed -She turned in FMLA paperwork today -She is going to call the ostomy clinic to see if she can go back to the old ostomy bags that she was using as that did not cause any skin irritation.  Will    No orders of the defined types were placed in this encounter.    The total time spent in the appointment was 20-29 minutes.   Leeanna Slaby L Akaila Rambo, PA-C 02/01/22

## 2022-02-01 ENCOUNTER — Inpatient Hospital Stay: Payer: BC Managed Care – PPO

## 2022-02-01 ENCOUNTER — Other Ambulatory Visit: Payer: Self-pay

## 2022-02-01 ENCOUNTER — Other Ambulatory Visit: Payer: Self-pay | Admitting: Physician Assistant

## 2022-02-01 ENCOUNTER — Inpatient Hospital Stay (HOSPITAL_BASED_OUTPATIENT_CLINIC_OR_DEPARTMENT_OTHER): Payer: BC Managed Care – PPO | Admitting: Physician Assistant

## 2022-02-01 VITALS — BP 132/92 | HR 77 | Temp 98.1°F | Resp 18 | Ht 63.0 in | Wt 185.1 lb

## 2022-02-01 VITALS — BP 130/75 | HR 76 | Temp 98.6°F | Resp 18

## 2022-02-01 DIAGNOSIS — C187 Malignant neoplasm of sigmoid colon: Secondary | ICD-10-CM

## 2022-02-01 DIAGNOSIS — K5900 Constipation, unspecified: Secondary | ICD-10-CM | POA: Diagnosis not present

## 2022-02-01 DIAGNOSIS — Z95828 Presence of other vascular implants and grafts: Secondary | ICD-10-CM

## 2022-02-01 DIAGNOSIS — G4730 Sleep apnea, unspecified: Secondary | ICD-10-CM | POA: Diagnosis not present

## 2022-02-01 DIAGNOSIS — K5732 Diverticulitis of large intestine without perforation or abscess without bleeding: Secondary | ICD-10-CM | POA: Diagnosis not present

## 2022-02-01 DIAGNOSIS — J90 Pleural effusion, not elsewhere classified: Secondary | ICD-10-CM | POA: Diagnosis not present

## 2022-02-01 DIAGNOSIS — M797 Fibromyalgia: Secondary | ICD-10-CM | POA: Diagnosis not present

## 2022-02-01 DIAGNOSIS — Z79899 Other long term (current) drug therapy: Secondary | ICD-10-CM | POA: Diagnosis not present

## 2022-02-01 DIAGNOSIS — Z800 Family history of malignant neoplasm of digestive organs: Secondary | ICD-10-CM | POA: Diagnosis not present

## 2022-02-01 DIAGNOSIS — Z452 Encounter for adjustment and management of vascular access device: Secondary | ICD-10-CM

## 2022-02-01 DIAGNOSIS — D649 Anemia, unspecified: Secondary | ICD-10-CM

## 2022-02-01 DIAGNOSIS — D3502 Benign neoplasm of left adrenal gland: Secondary | ICD-10-CM | POA: Diagnosis not present

## 2022-02-01 DIAGNOSIS — Z5112 Encounter for antineoplastic immunotherapy: Secondary | ICD-10-CM | POA: Diagnosis not present

## 2022-02-01 DIAGNOSIS — K760 Fatty (change of) liver, not elsewhere classified: Secondary | ICD-10-CM | POA: Diagnosis not present

## 2022-02-01 DIAGNOSIS — K8020 Calculus of gallbladder without cholecystitis without obstruction: Secondary | ICD-10-CM | POA: Diagnosis not present

## 2022-02-01 DIAGNOSIS — I10 Essential (primary) hypertension: Secondary | ICD-10-CM | POA: Diagnosis not present

## 2022-02-01 DIAGNOSIS — C7972 Secondary malignant neoplasm of left adrenal gland: Secondary | ICD-10-CM | POA: Diagnosis not present

## 2022-02-01 DIAGNOSIS — D259 Leiomyoma of uterus, unspecified: Secondary | ICD-10-CM | POA: Diagnosis not present

## 2022-02-01 DIAGNOSIS — K6819 Other retroperitoneal abscess: Secondary | ICD-10-CM | POA: Diagnosis not present

## 2022-02-01 LAB — CMP (CANCER CENTER ONLY)
ALT: 33 U/L (ref 0–44)
AST: 36 U/L (ref 15–41)
Albumin: 3.9 g/dL (ref 3.5–5.0)
Alkaline Phosphatase: 307 U/L — ABNORMAL HIGH (ref 38–126)
Anion gap: 5 (ref 5–15)
BUN: 17 mg/dL (ref 6–20)
CO2: 26 mmol/L (ref 22–32)
Calcium: 9.4 mg/dL (ref 8.9–10.3)
Chloride: 107 mmol/L (ref 98–111)
Creatinine: 0.88 mg/dL (ref 0.44–1.00)
GFR, Estimated: >60 mL/min (ref >60)
Glucose, Bld: 92 mg/dL (ref 70–99)
Potassium: 4 mmol/L (ref 3.5–5.1)
Sodium: 138 mmol/L (ref 135–145)
Total Bilirubin: 0.2 mg/dL — ABNORMAL LOW (ref 0.3–1.2)
Total Protein: 7.6 g/dL (ref 6.5–8.1)

## 2022-02-01 LAB — CBC WITH DIFFERENTIAL (CANCER CENTER ONLY)
Abs Immature Granulocytes: 0.03 10*3/uL (ref 0.00–0.07)
Basophils Absolute: 0 10*3/uL (ref 0.0–0.1)
Basophils Relative: 0 %
Eosinophils Absolute: 0.2 10*3/uL (ref 0.0–0.5)
Eosinophils Relative: 2 %
HCT: 26.6 % — ABNORMAL LOW (ref 36.0–46.0)
Hemoglobin: 8.3 g/dL — ABNORMAL LOW (ref 12.0–15.0)
Immature Granulocytes: 0 %
Lymphocytes Relative: 21 %
Lymphs Abs: 1.6 10*3/uL (ref 0.7–4.0)
MCH: 23.6 pg — ABNORMAL LOW (ref 26.0–34.0)
MCHC: 31.2 g/dL (ref 30.0–36.0)
MCV: 75.8 fL — ABNORMAL LOW (ref 80.0–100.0)
Monocytes Absolute: 0.4 10*3/uL (ref 0.1–1.0)
Monocytes Relative: 5 %
Neutro Abs: 5.5 10*3/uL (ref 1.7–7.7)
Neutrophils Relative %: 72 %
Platelet Count: 563 10*3/uL — ABNORMAL HIGH (ref 150–400)
RBC: 3.51 MIL/uL — ABNORMAL LOW (ref 3.87–5.11)
RDW: 18.6 % — ABNORMAL HIGH (ref 11.5–15.5)
WBC Count: 7.8 10*3/uL (ref 4.0–10.5)
nRBC: 0 % (ref 0.0–0.2)

## 2022-02-01 LAB — TSH: TSH: 1.023 u[IU]/mL (ref 0.350–4.500)

## 2022-02-01 LAB — IRON AND IRON BINDING CAPACITY (CC-WL,HP ONLY)
Iron: 22 ug/dL — ABNORMAL LOW (ref 28–170)
Saturation Ratios: 10 % — ABNORMAL LOW (ref 10.4–31.8)
TIBC: 228 ug/dL — ABNORMAL LOW (ref 250–450)
UIBC: 206 ug/dL (ref 148–442)

## 2022-02-01 LAB — CEA (IN HOUSE-CHCC): CEA (CHCC-In House): <1 ng/mL (ref 0.00–5.00)

## 2022-02-01 LAB — PREGNANCY, URINE: Preg Test, Ur: NEGATIVE

## 2022-02-01 MED ORDER — HEPARIN SOD (PORK) LOCK FLUSH 100 UNIT/ML IV SOLN: 500.0000 [IU] | INTRAVENOUS | Status: DC | PRN

## 2022-02-01 MED ORDER — HEPARIN SOD (PORK) LOCK FLUSH 100 UNIT/ML IV SOLN
500.0000 [IU] | Freq: Once | INTRAVENOUS | Status: AC | PRN
  Administered 2022-02-01: 500 [IU]

## 2022-02-01 MED ORDER — SODIUM CHLORIDE 0.9 % IV SOLN: Freq: Once | INTRAVENOUS | Status: AC

## 2022-02-01 MED ORDER — SODIUM CHLORIDE 0.9% FLUSH
10.0000 mL | INTRAVENOUS | Status: DC | PRN
  Administered 2022-02-01: 10 mL

## 2022-02-01 MED ORDER — SODIUM CHLORIDE 0.9% FLUSH
10.0000 mL | Freq: Once | INTRAVENOUS | Status: AC
  Administered 2022-02-01: 10 mL

## 2022-02-01 MED ORDER — SODIUM CHLORIDE 0.9 % IV SOLN
200.0000 mg | Freq: Once | INTRAVENOUS | Status: AC
  Administered 2022-02-01: 200 mg via INTRAVENOUS
  Filled 2022-02-01: qty 200

## 2022-02-01 NOTE — Patient Instructions (Signed)
[facility]   Discharge Instructions: Thank you for choosing [facility] to provide your oncology and hematology care.   If you have a lab appointment with the Cancer Center, please go directly to the Cancer Center and check in at the registration area.   Wear comfortable clothing and clothing appropriate for easy access to any Portacath or PICC line.   We strive to give you quality time with your provider. You may need to reschedule your appointment if you arrive late (15 or more minutes).  Arriving late affects you and other patients whose appointments are after yours.  Also, if you miss three or more appointments without notifying the office, you may be dismissed from the clinic at the provider's discretion.      For prescription refill requests, have your pharmacy contact our office and allow 72 hours for refills to be completed.    Today you received the following chemotherapy and/or immunotherapy agents: Pembrolizumab (Keytruda)      To help prevent nausea and vomiting after your treatment, we encourage you to take your nausea medication as directed.  BELOW ARE SYMPTOMS THAT SHOULD BE REPORTED IMMEDIATELY: *FEVER GREATER THAN 100.4 F (38 C) OR HIGHER *CHILLS OR SWEATING *NAUSEA AND VOMITING THAT IS NOT CONTROLLED WITH YOUR NAUSEA MEDICATION *UNUSUAL SHORTNESS OF BREATH *UNUSUAL BRUISING OR BLEEDING *URINARY PROBLEMS (pain or burning when urinating, or frequent urination) *BOWEL PROBLEMS (unusual diarrhea, constipation, pain near the anus) TENDERNESS IN MOUTH AND THROAT WITH OR WITHOUT PRESENCE OF ULCERS (sore throat, sores in mouth, or a toothache) UNUSUAL RASH, SWELLING OR PAIN  UNUSUAL VAGINAL DISCHARGE OR ITCHING   Items with * indicate a potential emergency and should be followed up as soon as possible or go to the Emergency Department if any problems should occur.  Please show the CHEMOTHERAPY ALERT CARD or IMMUNOTHERAPY ALERT  CARD at check-in to the Emergency Department and triage nurse.  Should you have questions after your visit or need to cancel or reschedule your appointment, please contact [facility]  Dept: [phone]  and follow the prompts.  Office hours are 8:00 a.m. to 4:30 p.m. Monday - Friday. Please note that voicemails left after 4:00 p.m. may not be returned until the following business day.  We are closed weekends and major holidays. You have access to a nurse at all times for urgent questions. Please call the main number to the clinic Dept: [phone] and follow the prompts.   For any non-urgent questions, you may also contact your provider using MyChart. We now offer e-Visits for anyone 18 and older to request care online for non-urgent symptoms. For details visit [email or web address].   Also download the MyChart app! Go to the app store, search "MyChart", open the app, select [facility], and log in with your MyChart username and password.  Masks are optional in the cancer centers. If you would like for your care team to wear a mask while they are taking care of you, please let them know. You may have one support person who is at least 38 years old accompany you for your appointments. 

## 2022-02-01 NOTE — Progress Notes (Signed)
Nutrition Follow-up:  Patient with colorectal cancer s/p colostomy.  Patient receiving [place].    Met with patient during infusion.  Reports that she gets full quick.  Has been trying to snack more/eat smaller meals.  Has been having more liquid stool following drinking CT contrast.  Tried carnation instant breakfast recently and got sick after drinking it.      Medications: reviewed  Labs: reviewed  Anthropometrics:   185 lb 1.6 oz today 189 lb 8 oz on 6/27 183 lb 4 oz on 6/13 176 lb  on 5/16 168 lb on 4/25   NUTRITION DIAGNOSIS: Unintentional weight loss stable   INTERVENTION:  Discussed foods to help thicken stool. MD suggested metamucil (good source of psyllium fiber) Encouraged patient to try different oral nutrition supplements.  Was drinking ensure following surgery     MONITORING, EVALUATION, GOAL: weight trends, intake   NEXT VISIT: Monday, Sept 18 during infusion  Jayson Waterhouse B. Zenia Resides, [place], [place] Registered Dietitian [phone]

## 2022-02-02 ENCOUNTER — Other Ambulatory Visit: Payer: Self-pay | Admitting: Hematology

## 2022-02-02 DIAGNOSIS — C187 Malignant neoplasm of sigmoid colon: Secondary | ICD-10-CM

## 2022-02-02 DIAGNOSIS — C786 Secondary malignant neoplasm of retroperitoneum and peritoneum: Secondary | ICD-10-CM

## 2022-02-02 LAB — T4: T4, Total: 7.3 ug/dL (ref 4.5–12.0)

## 2022-02-22 ENCOUNTER — Inpatient Hospital Stay: Payer: BC Managed Care – PPO | Attending: Hematology

## 2022-02-22 ENCOUNTER — Other Ambulatory Visit: Payer: Self-pay

## 2022-02-22 ENCOUNTER — Encounter: Payer: Self-pay | Admitting: Hematology

## 2022-02-22 ENCOUNTER — Encounter: Payer: Self-pay | Admitting: Nurse Practitioner

## 2022-02-22 ENCOUNTER — Inpatient Hospital Stay: Payer: BC Managed Care – PPO

## 2022-02-22 ENCOUNTER — Inpatient Hospital Stay (HOSPITAL_BASED_OUTPATIENT_CLINIC_OR_DEPARTMENT_OTHER): Payer: BC Managed Care – PPO | Admitting: Hematology

## 2022-02-22 ENCOUNTER — Inpatient Hospital Stay (HOSPITAL_BASED_OUTPATIENT_CLINIC_OR_DEPARTMENT_OTHER): Payer: BC Managed Care – PPO | Admitting: Nurse Practitioner

## 2022-02-22 ENCOUNTER — Inpatient Hospital Stay: Payer: BC Managed Care – PPO | Admitting: Nutrition

## 2022-02-22 VITALS — BP 128/86 | HR 72 | Temp 98.6°F | Resp 18 | Ht 63.0 in | Wt 189.1 lb

## 2022-02-22 VITALS — BP 132/96 | HR 71 | Temp 98.6°F | Resp 18

## 2022-02-22 DIAGNOSIS — Z800 Family history of malignant neoplasm of digestive organs: Secondary | ICD-10-CM | POA: Diagnosis not present

## 2022-02-22 DIAGNOSIS — Z452 Encounter for adjustment and management of vascular access device: Secondary | ICD-10-CM

## 2022-02-22 DIAGNOSIS — C786 Secondary malignant neoplasm of retroperitoneum and peritoneum: Secondary | ICD-10-CM

## 2022-02-22 DIAGNOSIS — Z515 Encounter for palliative care: Secondary | ICD-10-CM

## 2022-02-22 DIAGNOSIS — Z803 Family history of malignant neoplasm of breast: Secondary | ICD-10-CM | POA: Diagnosis not present

## 2022-02-22 DIAGNOSIS — K529 Noninfective gastroenteritis and colitis, unspecified: Secondary | ICD-10-CM | POA: Diagnosis not present

## 2022-02-22 DIAGNOSIS — D509 Iron deficiency anemia, unspecified: Secondary | ICD-10-CM

## 2022-02-22 DIAGNOSIS — Z79899 Other long term (current) drug therapy: Secondary | ICD-10-CM | POA: Insufficient documentation

## 2022-02-22 DIAGNOSIS — R530 Neoplastic (malignant) related fatigue: Secondary | ICD-10-CM

## 2022-02-22 DIAGNOSIS — C187 Malignant neoplasm of sigmoid colon: Secondary | ICD-10-CM | POA: Diagnosis not present

## 2022-02-22 DIAGNOSIS — C7972 Secondary malignant neoplasm of left adrenal gland: Secondary | ICD-10-CM | POA: Insufficient documentation

## 2022-02-22 DIAGNOSIS — Z5112 Encounter for antineoplastic immunotherapy: Secondary | ICD-10-CM | POA: Insufficient documentation

## 2022-02-22 DIAGNOSIS — G893 Neoplasm related pain (acute) (chronic): Secondary | ICD-10-CM | POA: Diagnosis not present

## 2022-02-22 DIAGNOSIS — K760 Fatty (change of) liver, not elsewhere classified: Secondary | ICD-10-CM | POA: Insufficient documentation

## 2022-02-22 DIAGNOSIS — K8020 Calculus of gallbladder without cholecystitis without obstruction: Secondary | ICD-10-CM | POA: Insufficient documentation

## 2022-02-22 DIAGNOSIS — D3502 Benign neoplasm of left adrenal gland: Secondary | ICD-10-CM | POA: Insufficient documentation

## 2022-02-22 DIAGNOSIS — D649 Anemia, unspecified: Secondary | ICD-10-CM | POA: Insufficient documentation

## 2022-02-22 DIAGNOSIS — J90 Pleural effusion, not elsewhere classified: Secondary | ICD-10-CM | POA: Diagnosis not present

## 2022-02-22 DIAGNOSIS — D75839 Thrombocytosis, unspecified: Secondary | ICD-10-CM | POA: Diagnosis not present

## 2022-02-22 DIAGNOSIS — K5720 Diverticulitis of large intestine with perforation and abscess without bleeding: Secondary | ICD-10-CM | POA: Diagnosis not present

## 2022-02-22 DIAGNOSIS — K5900 Constipation, unspecified: Secondary | ICD-10-CM

## 2022-02-22 LAB — CMP (CANCER CENTER ONLY)
ALT: 22 U/L (ref 0–44)
AST: 16 U/L (ref 15–41)
Albumin: 3.4 g/dL — ABNORMAL LOW (ref 3.5–5.0)
Alkaline Phosphatase: 268 U/L — ABNORMAL HIGH (ref 38–126)
Anion gap: 8 (ref 5–15)
BUN: 19 mg/dL (ref 6–20)
CO2: 23 mmol/L (ref 22–32)
Calcium: 9.1 mg/dL (ref 8.9–10.3)
Chloride: 109 mmol/L (ref 98–111)
Creatinine: 0.73 mg/dL (ref 0.44–1.00)
GFR, Estimated: >60 mL/min (ref >60)
Glucose, Bld: 91 mg/dL (ref 70–99)
Potassium: 3.6 mmol/L (ref 3.5–5.1)
Sodium: 140 mmol/L (ref 135–145)
Total Bilirubin: 0.4 mg/dL (ref 0.3–1.2)
Total Protein: 7.7 g/dL (ref 6.5–8.1)

## 2022-02-22 LAB — CBC WITH DIFFERENTIAL (CANCER CENTER ONLY)
Abs Immature Granulocytes: 0.02 10*3/uL (ref 0.00–0.07)
Basophils Absolute: 0 10*3/uL (ref 0.0–0.1)
Basophils Relative: 0 %
Eosinophils Absolute: 0.2 10*3/uL (ref 0.0–0.5)
Eosinophils Relative: 3 %
HCT: 27.3 % — ABNORMAL LOW (ref 36.0–46.0)
Hemoglobin: 8.5 g/dL — ABNORMAL LOW (ref 12.0–15.0)
Immature Granulocytes: 0 %
Lymphocytes Relative: 23 %
Lymphs Abs: 1.3 10*3/uL (ref 0.7–4.0)
MCH: 24.3 pg — ABNORMAL LOW (ref 26.0–34.0)
MCHC: 31.1 g/dL (ref 30.0–36.0)
MCV: 78 fL — ABNORMAL LOW (ref 80.0–100.0)
Monocytes Absolute: 0.3 10*3/uL (ref 0.1–1.0)
Monocytes Relative: 5 %
Neutro Abs: 4.1 10*3/uL (ref 1.7–7.7)
Neutrophils Relative %: 69 %
Platelet Count: 515 10*3/uL — ABNORMAL HIGH (ref 150–400)
RBC: 3.5 MIL/uL — ABNORMAL LOW (ref 3.87–5.11)
RDW: 18.8 % — ABNORMAL HIGH (ref 11.5–15.5)
WBC Count: 5.9 10*3/uL (ref 4.0–10.5)
nRBC: 0 % (ref 0.0–0.2)

## 2022-02-22 LAB — IRON AND IRON BINDING CAPACITY (CC-WL,HP ONLY)
Iron: 36 ug/dL (ref 28–170)
Saturation Ratios: 14 % (ref 10.4–31.8)
TIBC: 258 ug/dL (ref 250–450)
UIBC: 222 ug/dL (ref 148–442)

## 2022-02-22 LAB — PREGNANCY, URINE: Preg Test, Ur: NEGATIVE

## 2022-02-22 LAB — CEA (IN HOUSE-CHCC): CEA (CHCC-In House): <1 ng/mL (ref 0.00–5.00)

## 2022-02-22 LAB — FERRITIN: Ferritin: 49 ng/mL (ref 11–307)

## 2022-02-22 MED ORDER — METHYLPREDNISOLONE SODIUM SUCC 125 MG IJ SOLR: 125.0000 mg | Freq: Once | INTRAMUSCULAR | Status: DC | PRN

## 2022-02-22 MED ORDER — DIPHENHYDRAMINE HCL 50 MG/ML IJ SOLN: 50.0000 mg | Freq: Once | INTRAMUSCULAR | Status: DC | PRN

## 2022-02-22 MED ORDER — OXYCODONE-ACETAMINOPHEN 7.5-325 MG PO TABS: 1.0000 | ORAL_TABLET | Freq: Three times a day (TID) | ORAL | 0 refills | Status: DC | PRN

## 2022-02-22 MED ORDER — SODIUM CHLORIDE 0.9% FLUSH
10.0000 mL | Freq: Once | INTRAVENOUS | Status: AC
  Administered 2022-02-22: 10 mL

## 2022-02-22 MED ORDER — SODIUM CHLORIDE 0.9 % IV SOLN: Freq: Once | INTRAVENOUS | Status: DC | PRN

## 2022-02-22 MED ORDER — ALBUTEROL SULFATE (2.5 MG/3ML) 0.083% IN NEBU: 2.5000 mg | INHALATION_SOLUTION | Freq: Once | RESPIRATORY_TRACT | Status: DC | PRN

## 2022-02-22 MED ORDER — ALTEPLASE 2 MG IJ SOLR: 2.0000 mg | Freq: Once | INTRAMUSCULAR | Status: DC | PRN

## 2022-02-22 MED ORDER — LIDOCAINE-PRILOCAINE 2.5-2.5 % EX CREA: 1.0000 | TOPICAL_CREAM | CUTANEOUS | 0 refills | Status: DC | PRN

## 2022-02-22 MED ORDER — SODIUM CHLORIDE 0.9% FLUSH
10.0000 mL | INTRAVENOUS | Status: DC | PRN
  Administered 2022-02-22: 10 mL

## 2022-02-22 MED ORDER — SODIUM CHLORIDE 0.9 % IV SOLN
200.0000 mg | Freq: Once | INTRAVENOUS | Status: AC
  Administered 2022-02-22: 200 mg via INTRAVENOUS
  Filled 2022-02-22: qty 200

## 2022-02-22 MED ORDER — HEPARIN SOD (PORK) LOCK FLUSH 100 UNIT/ML IV SOLN: 500.0000 [IU] | Freq: Once | INTRAVENOUS | Status: DC | PRN

## 2022-02-22 MED ORDER — SODIUM CHLORIDE 0.9 % IV SOLN: Freq: Once | INTRAVENOUS | Status: AC

## 2022-02-22 MED ORDER — FAMOTIDINE IN NACL 20-0.9 MG/50ML-% IV SOLN: 20.0000 mg | Freq: Once | INTRAVENOUS | Status: DC | PRN

## 2022-02-22 MED ORDER — HEPARIN SOD (PORK) LOCK FLUSH 100 UNIT/ML IV SOLN: 250.0000 [IU] | Freq: Once | INTRAVENOUS | Status: DC | PRN

## 2022-02-22 MED ORDER — SODIUM CHLORIDE 0.9% FLUSH: 10.0000 mL | Freq: Once | INTRAVENOUS | Status: DC | PRN

## 2022-02-22 MED ORDER — HEPARIN SOD (PORK) LOCK FLUSH 100 UNIT/ML IV SOLN
500.0000 [IU] | Freq: Once | INTRAVENOUS | Status: AC | PRN
  Administered 2022-02-22: 500 [IU]

## 2022-02-22 MED ORDER — EPINEPHRINE 0.3 MG/0.3ML IJ SOAJ: 0.3000 mg | Freq: Once | INTRAMUSCULAR | Status: DC | PRN

## 2022-02-22 MED ORDER — SODIUM CHLORIDE 0.9% FLUSH: 3.0000 mL | Freq: Once | INTRAVENOUS | Status: DC | PRN

## 2022-02-22 MED ORDER — SODIUM CHLORIDE 0.9 % IV SOLN
300.0000 mg | Freq: Once | INTRAVENOUS | Status: DC
  Filled 2022-02-22: qty 15

## 2022-02-22 NOTE — Progress Notes (Signed)
Nutrition follow-up completed with patient in the infusion room for colorectal cancer status post colostomy.  Patient is receiving Keytruda.  Weight improved and documented as 189.1 pounds September 18 from 183 pounds 4 ounces on June 13.  Patient reports her appetite and oral intake has improved.  She is still experimenting with different types of foods to determine overall tolerance.  Reports diarrhea has improved.  She doubts she is drinking adequate water/fluids.  Nutrition diagnosis: Unintentional weight loss resolved.  Intervention: Encouraged patient to continue exploring different food choices and consuming foods as tolerated. Encouraged increasing water intake to approximately 80 ounces daily. Reviewed strategies for improving diarrhea.  No follow-up has been scheduled.  Patient has RD contact information for future questions or concerns.

## 2022-02-22 NOTE — Progress Notes (Signed)
Mikayla Rios   [phone] Fax:([phone]   Clinic Follow up Note   Patient Care Team: Mikayla Chard, MD as PCP - General (Internal Medicine) Mikayla Heinz, MD as PCP - Cardiology (Cardiology) Mikayla Rios, Mikayla Rios as Physician Assistant (Emergency Medicine) Mikayla Merle, MD as Consulting Physician (Oncology) Mikayla Bake, RN as Oncology Nurse Navigator (Oncology) Mikayla Boston, MD as Consulting Physician (General Surgery) Mikayla Rios, Mikayla Partridge, MD as Consulting Physician (Neurology) Mikayla Rios, Mikayla Mocha, MD as Consulting Physician (Obstetrics and Gynecology) Mikayla Mayer, MD as Consulting Physician (Gastroenterology) Mikayla Rios, Mikayla Sax, NP as Nurse Practitioner (Nurse Practitioner)  Date of Service:  02/22/2022  CHIEF COMPLAINT: f/u of sigmoid colon cancer  CURRENT THERAPY:  Keytruda, q21d, started 09/08/21  ASSESSMENT & PLAN:  Mikayla Rios is a 36 y.o. female with   1. 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 She continued to have abdominal pain and drainage, undergoing drain exchange three times. -she developed worsening weakness and was admitted on 04/10/21. She was taken for emergent small bowel and left colon resection on 04/15/21 under Dr. Johney Maine. 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 Margins negative for invasive carcinoma, but low-grade dysplasia involves proximal margin. All 5 lymph nodes negative (0/5). MSI low. -repeat CT AP on 06/16/21 showed progressive metastatic disease involving soft tissue masses in left psoas muscle and left lateral abdominal wall and peritoneal masses, and new mild  retroperitoneal lymphadenopathy.  -baseline CEA on 06/22/21 was elevated at 87.41. -she started FOLFOX on 06/22/21 but did not respond -she switched to [facility] on 09/08/21. She has been tolerating well overall but has developed joint pain which could be related, overall manageable. She is clinically doing better also with more energy and less abdominal pain   -her CEA has normalized on immunotherapy. -CT CAP on 01/08/22 showed partial treatment response with improvement to left psoas muscle mass and omental and ovarian deposits but slight increase in omental caking in LLQ. Plan to repeat  -she continues to tolerate treatment well. She reports some LLQ soreness and fatigue. Labs reviewed, overall stable, will continue Keytruda -repeat CT in 2-3 weeks     2. Moderate anemia and thrombocytosis -blood transfusion if Hg<8.0 -patient reports struggling with some constipation with taking iron pills in the past. Will give IV iron as needed to help bolster iron levels.  -hgb stable at 8.5, plt 515k today (02/22/22), but she is symptomatic with increasing fatigue. Iron panel and ferritin are pending, will give IV iron if indicated.   3. Genetics, FAP -FO showed MSI-High disease, and multiple mutations including APC, [name]6, and BRCA2 mutations.  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  -we previously discussed testing for her children when they are of age.     PLAN: -proceed with Cycle 9 of Keytruda today   -palliative care and nutrition will see her today -lab, flush, f/u, and Keytruda in 3 and 6 weeks -restaging CT in 2-3 weeks before next cycle -will give iv venofer today if iron level low    No problem-specific Assessment & Plan notes found for this encounter.   SUMMARY OF ONCOLOGIC HISTORY: Oncology History Overview Note   Cancer Staging  Malignant neoplasm of sigmoid colon ([place])  Staging form: Colon and Rectum, AJCC 8th Edition - Pathologic stage from  04/15/2021: Stage IIC (pT4b, pN0, cM0) - Signed by Mikayla Merle, MD on 06/12/2021    Malignant neoplasm of sigmoid colon [facility])   Initial Diagnosis   Malignant neoplasm of sigmoid colon ([place])   11/04/2020 Imaging   EXAM: CT ABDOMEN AND PELVIS WITHOUT CONTRAST  IMPRESSION: 1. Perforating descending colonic diverticulitis with multiple abdominopelvic gas and fluid collections, measuring up to 5.7 cm and further described above. 2. Cholelithiasis without findings of acute cholecystitis. 3. 3.6 cm benign left adrenal adenoma. 4. Leiomyomatous uterus. 5. 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   11/07/2020 Imaging   EXAM: CT ABDOMEN AND PELVIS WITHOUT CONTRAST  IMPRESSION: Continued wall thickening is seen involving descending colon suggesting infectious or inflammatory colitis or perforated diverticulitis. There is an adjacent fluid collection measuring 7.0 x 5.0 cm consistent with abscess which is significantly enlarged compared to prior exam. Adjacent to the abscess, there is a severely thickened small bowel loop most consistent with secondary inflammation.   5.2 x 3.5 cm fluid collection is noted within the left psoas muscle consistent with abscess which is significantly enlarged compared to prior exam. 4.4 x 3.8 cm fluid collection consistent with abscess is noted in the left retroperitoneal region which is also enlarged compared to prior exam.   Fibroid uterus.   Cholelithiasis.   11/27/2020 Imaging   EXAM: CT ABDOMEN AND PELVIS WITHOUT CONTRAST  IMPRESSION: 1. Significantly interval decreased size of the previously visualized left retroperitoneal and left anterior mid abdominal fluid collections. There is persistent fat stranding and mural thickening of the mid descending colon at this level. 2. Unchanged leiomyomatous uterus. 3. Unchanged cholelithiasis.   12/11/2020 Imaging    EXAM: CT ABDOMEN AND PELVIS WITHOUT CONTRAST  IMPRESSION: 1. Stable inflammatory changes centered around the distal descending colon in the left lower abdomen. Pericolonic inflammatory changes have minimally changed since 11/27/2020. Small pocket of gas medial to the colon is probably associated with a fistula or small residual abscess collection. 2. Stable position of the two percutaneous drains. The more anterior drain may be extending through a portion of the small bowel. 3. Cholelithiasis. 4. Fibroid uterus. Cannot exclude an ovarian/adnexal cystic structure near the uterine fundus. 5. Left adrenal adenoma.   01/07/2021 Imaging   EXAM: CT ABDOMEN AND PELVIS WITH CONTRAST  IMPRESSION: 1. No new abscesses identified. Similar degree of soft tissue thickening seen in the left pericolonic region. The degree of persistent soft tissues thickening in the pericolonic space further raises suspicions for malignancy. Further evaluation with colonoscopy should be performed. 2. Left retroperitoneal abscess drain unchanged in position. Anterior left abdominal drain again seen terminating within small bowel loop. 3. 2.6 cm mildly sclerotic lesion noted in the L2 vertebral body. Further evaluation with contrast enhanced lumbar spine MRI should be performed.   02/04/2021 Imaging   EXAM: CT ABDOMEN AND PELVIS WITH CONTRAST  IMPRESSION: No new abdominopelvic collections or abscess development in the 1 month interval.   Left anterior drain remains within a loop of small bowel, unchanged.   Left lateral abscess drain remains in the retroperitoneal space adjacent to the iliopsoas muscle with a small amount of residual fluid and air but no measurable collection.   Stable soft tissue prominence and pericolonic strandy edema/inflammation about the left descending colon compatible with residual diverticulitis/colitis. Difficult to exclude underlying transmural lesion.   04/01/2021 Imaging  EXAM: CT ABDOMEN AND PELVIS WITH CONTRAST  IMPRESSION: There appears to be significant enhancement and wall thickening involving the descending colon with some degree of traction and involvement of adjacent small bowel loops. This is consistent with the history of diverticulitis and perforation. Stable position of percutaneous drainage catheter is seen adjacent to left psoas muscle with no significant residual fluid remaining. The other percutaneous drainage catheter that was previously noted to be within small bowel loop on prior exam in the left lower quadrant, has significantly retracted and appears to be outside of the peritoneal space at this time.   Since the prior exam, there does appear to be some degree of rotation involving mesenteric vessels and structures in the right lower quadrant, suggesting partial volvulus or malrotation. There is the interval development of several lymph nodes in this area, most likely inflammatory or reactive in etiology. Mild amount of free fluid is also noted in the pelvis. However, no significant bowel wall thickening or dilatation is seen in this area. These results will be called to the ordering clinician or representative by the Radiologist Assistant, and communication documented in the PACS or zVision Dashboard.   Stable uterine fibroid.   Hepatic steatosis.   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   Cholelithiasis.   04/10/2021 Imaging   EXAM: CT ANGIOGRAPHY CHEST CT ABDOMEN AND PELVIS WITH CONTRAST  IMPRESSION: 1. Again seen are findings compatible with descending colon diverticulitis with perforation. Free air and inflammation have increased in the interval. 2. New lobulated enhancing fluid collection posterior to this segment of inflamed colon measuring 8.5 x 3.5 x 10.0 cm. This collection now invades the adjacent iliopsoas muscle as well as extends through the left lateral abdominal wall. The tip of the drainage catheter is in this  collection. Findings are compatible with abscess. 3. Anterior left percutaneous drainage catheter tip has been pulled back and is now within the subcutaneous tissues. 4. Trace free fluid. 5. No pulmonary embolism.  No acute cardiopulmonary process. 6. Cholelithiasis. 7. Fatty infiltration of the liver.   04/15/2021 Definitive Surgery   FINAL MICROSCOPIC DIAGNOSIS:   A. SMALL BOWEL, RESECTION:  - Adenocarcinoma.  - No carcinoma identified in 1 lymph node.   B. PERFORATED LEFT COLON, RESECTION:  - Moderately differentiated colonic adenocarcinoma.  - Tumor extends into pericolonic adipose tissue, and is strongly  suggestive of invasion into small bowel.  See oncology table/comments.  - No carcinoma identified in 4 lymph nodes (0/4).  - Tubular adenoma with high-grade dysplasia, 1.  - Tubular adenomas with low grade dysplasia, 3.   Comments: The size of the tumor is difficult to estimate secondary to the disrupted nature of the specimen, as well as the infiltrative nature of the tumor.  Tumor can be identified as definitely invading into the pericolonic adipose tissue (block B4), but given the extreme disruption of the tissue, and the involvement of the small bowel by what appears to be a colonic adenocarcinoma, I favor perforation of the large bowel with direct invasion into the small bowel.  Accordingly, I believe this is best regarded as a pT4b lesion.   ADDENDUM:  Mismatch Repair Protein (IHC)  SUMMARY INTERPRETATION: ABNORMAL  There is loss of the major MMR protein [name]6: This indicates a high probability that a hereditary germline mutation is present and referral to genetic counseling is warranted. It is recommended that the loss of protein expression be correlated with molecular based microsatellite instability testing.   IHC EXPRESSION RESULTS  TEST  RESULT  MLH1:          Preserved nuclear expression  [name]2:          Preserved nuclear expression  [name]6:          LOSS OF  NUCLEAR EXPRESSION  PMS2:          Preserved nuclear expression    04/15/2021 Cancer Staging   Staging form: Colon and Rectum, AJCC 8th Edition - Pathologic stage from 04/15/2021: Stage IIC (pT4b, pN0, cM0) - Signed by Mikayla Merle, MD on 06/12/2021 Stage prefix: Initial diagnosis Total positive nodes: 0 Histologic grading system: 4 grade system Histologic grade (G): G2 Residual tumor (R): R0 - None   04/29/2021 Imaging   EXAM: CT ABDOMEN AND PELVIS WITH CONTRAST  IMPRESSION: Continued left retroperitoneal abscesses inferior to the left kidney, involving the left psoas muscle and left abdominal wall musculature. Overall size is decreased since prior study. Interval removal of left lower quadrant abscess drainage catheter.   New fluid collection in the cul-de-sac and wrapping around the uterus concerning for abscess.   Cholelithiasis.   Small left pleural effusion.  Bibasilar atelectasis.   Stable left adrenal adenoma.  ADDENDUM: After discussing the case with Dr. Dwaine Gale in interventional radiology, it was noted that the fluid in the pelvis originally thought to be in the cul-de-sac is likely within the vagina, best seen on sagittal imaging. Recommend speculum exam.   Also, in the left lateral wall in the area of prior abscess in the lateral wall abdominal musculature, some of the abdomen soft tissue appears to be enhancing. While this could be infectious, cannot exclude tumor seeding in the left lateral abdominal wall, measuring 6.9 x 3.8 cm on image 64 of series 2.   06/16/2021 Imaging   EXAM: CT ABDOMEN AND PELVIS WITH CONTRAST  IMPRESSION: Increased size of bulky soft tissue masses involving the left psoas muscle and left lateral abdominal wall soft tissues, consistent with progressive metastatic disease.   Significant progression of multiple peritoneal masses throughout the abdomen and pelvis, consistent with peritoneal carcinomatosis.   New mild retroperitoneal  lymphadenopathy, consistent with metastatic disease.   Stable uterine fibroid and left adrenal adenoma.   Cholelithiasis, without evidence of cholecystitis.   06/22/2021 - 08/19/2021 Chemotherapy   Patient is on Treatment Plan : COLORECTAL FOLFOX q14d x 6 months     06/22/2021 Tumor Marker   Patient's tumor was tested for the following markers: CEA. Results of the tumor marker test revealed 87.41.   07/10/2021 Pathology Results   FINAL MICROSCOPIC DIAGNOSIS:   A. PERITONEAL MASS, LEFT UPPER ABDOMINAL QUADRANT, BIOPSY:  -  Metastatic adenocarcinoma with necrosis, histologically consistent with colon primary.     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 The report date is 07/16/2021. The remainder of testing was negative/normal.  The CancerNext-Expanded + RNAinsight gene panel offered by [employer] and includes sequencing and rearrangement analysis for the following 77 genes: IP, ALK, APC*, ATM*, AXIN2, BAP1, BARD1, BLM, BMPR1A, BRCA1*, BRCA2*, BRIP1*, CDC73, CDH1*,CDK4, CDKN1B, CDKN2A, CHEK2*, CTNNA1, DICER1, FANCC, FH, FLCN, GALNT12, KIF1B, LZTR1, MAX, MEN1, MET, MLH1*, [name]2*, [name]3, [name]6*, MUTYH*, NBN, NF1*, NF2, NTHL1, PALB2*, PHOX2B, PMS2*, POT1, PRKAR1A, PTCH1, PTEN*, RAD51C*, RAD51D*,RB1, RECQL, RET, SDHA, SDHAF2, SDHB, SDHC, SDHD, SMAD4, SMARCA4, SMARCB1, SMARCE1, STK11, SUFU, TMEM127, TP53*,TSC1, TSC2, VHL and XRCC2 (sequencing and deletion/duplication); EGFR, EGLN1, HOXB13, KIT, MITF, PDGFRA, POLD1 and POLE (sequencing only); EPCAM and GREM1 (deletion/duplication only).   08/28/2021 Imaging  EXAM: CT CHEST, ABDOMEN, AND PELVIS WITH CONTRAST  IMPRESSION: 1. Continued interval progression of multiple metastatic soft tissue nodules/masses in the abdomen and pelvis. 2. No evidence for metastatic disease in the chest. 3. Similar appearance of the subtle lesion in the L2 vertebral body, suspicious for  metastatic involvement. 4. Cholelithiasis.   09/03/2021 -  Chemotherapy   Patient is on Treatment Plan : COLORECTAL Pembrolizumab (200) q21d     09/08/2021 - 02/01/2022 Chemotherapy   Patient is on Treatment Plan : COLORECTAL Pembrolizumab (200) q21d     01/08/2022 Imaging   CT CAP IMPRESSION: 1. Mixed response to therapy. Interval decrease in size of the conglomerate multilobulated mass extending from the left psoas into the left posterior pararenal space through the left lateral abdominal wall along the tract of the previously placed percutaneous drainage catheter. Interval decrease in size of right adnexal mass and omental masses within the right lower quadrant of the abdomen. Interval development of peritoneal carcinomatosis with omental caking within the left lower quadrant of the abdomen and left subdiaphragmatic region adjacent to the spleen and along the left pericolic gutter as well as development of ascites appearing loculated within the anterior peritoneum. 2. Interval development of mild left hydronephrosis secondary to stricturing of the mid left ureter. 3. Stable left adrenal metastasis. 4. Stable sclerotic lesion within the L2 vertebral body. No new lytic or blastic bone lesions are seen. 5. Cholelithiasis. 6. No evidence of intrathoracic metastatic disease. 7. 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 Moderate colonic stool burden. No evidence of obstruction.   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 She was last seen by PA Cassie on 02/01/22. She presents to the clinic accompanied by her husband. She reports some soreness to her LLQ and increased fatigue.  She feels she is overall better.   All other systems were reviewed with the patient and are negative.  MEDICAL HISTORY:  Past Medical History:  Diagnosis Date   Allergy    seasonal   Anemia    Blood infection 1985   Blood transfusion without reported diagnosis    Diverticulitis    Family history of breast cancer    Family history of colon cancer    Family history of stomach cancer    Hypertension    Obesity    Sleep apnea     SURGICAL HISTORY: Past Surgical History:  Procedure Laterality Date   COLECTOMY WITH COLOSTOMY CREATION/HARTMANN PROCEDURE N/A 04/15/2021   Procedure: COLOSTOMY CREATION/HARTMANN PROCEDURE;  Surgeon: Mikayla Boston, MD;  Location: [facility];  Service: General;  Laterality: N/A;   IR CATHETER TUBE CHANGE  12/12/2020   IR CATHETER TUBE CHANGE  01/27/2021   IR CATHETER TUBE CHANGE  02/19/2021   IR CATHETER TUBE CHANGE  04/13/2021   IR IMAGING GUIDED PORT INSERTION  10/02/2021   IR RADIOLOGIST EVAL & MGMT  11/27/2020   IR RADIOLOGIST EVAL & MGMT  12/11/2020   IR RADIOLOGIST EVAL & MGMT  01/07/2021   IR RADIOLOGIST EVAL & MGMT  02/04/2021   IR SINUS/FIST TUBE CHK-NON GI  12/12/2020   IR SINUS/FIST TUBE CHK-NON GI  02/19/2021   LAPAROSCOPIC PARTIAL COLECTOMY N/A  04/15/2021   Procedure: LAPAROSCOPIC ASSISTED HARTMANN RESECTION;  Surgeon: Mikayla Boston, MD;  Location: [facility];  Service: General;  Laterality: N/A;    I have reviewed the social history and family history with the patient and they are unchanged from previous note.  ALLERGIES:  is allergic to chlorhexidine gluconate, shellfish allergy, and penicillins.  MEDICATIONS:  Current Outpatient Medications  Medication Sig Dispense Refill   lidocaine-prilocaine (EMLA) cream Apply 1 Application topically as needed. 30 g 0   amLODipine (NORVASC) 10 MG tablet Take 10 mg by mouth every evening.     DULoxetine (CYMBALTA) 60 MG capsule TAKE 1 CAPSULE BY MOUTH EVERY DAY 90 capsule 2   HYDROmorphone (DILAUDID) 2 MG  tablet Take 1 tablet (2 mg total) by mouth every 6 (six) hours as needed for severe pain. 30 tablet 0   metoprolol tartrate 75 MG TABS Take 75 mg by mouth 2 (two) times daily. 90 tablet 3   potassium chloride 20 MEQ/15ML (10%) SOLN Take 45 mLs (60 mEq total) by mouth daily. 473 mL 3   POTASSIUM PO Take by mouth.     No current facility-administered medications for this visit.   Facility-Administered Medications Ordered in Other Visits  Medication Dose Route Frequency Provider Last Rate Last Admin   fentaNYL (SUBLIMAZE) injection   Intravenous PRN Mir, Paula Libra, MD   50 mcg at 10/02/21 1436   midazolam (VERSED) injection   Intravenous PRN Mir, Paula Libra, MD   1 mg at 10/02/21 1436    PHYSICAL EXAMINATION: ECOG PERFORMANCE STATUS: 2 - Symptomatic, <50% confined to bed  Vitals:   02/22/22 0833  BP: 128/86  Pulse: 72  Resp: 18  Temp: 98.6 F (37 C)  SpO2: 100%   Wt Readings from Last 3 Encounters:  02/22/22 189 lb 1.6 oz (85.8 kg)  02/01/22 185 lb 1.6 oz (84 kg)  01/12/22 187 lb 3.2 oz (84.9 kg)     GENERAL:alert, no distress and comfortable SKIN: skin color, texture, turgor are normal, no rashes or significant lesions EYES: normal, Conjunctiva are pink and non-injected, sclera clear  ABDOMEN: soft, (+) tenderness to left side Musculoskeletal:no cyanosis of digits and no clubbing  NEURO: alert & oriented x 3 with fluent speech, no focal motor/sensory deficits  LABORATORY DATA:  I have reviewed the data as listed    Latest Ref Rng & Units 02/22/2022    8:19 AM 02/01/2022    9:21 AM 01/12/2022    9:18 AM  CBC  WBC 4.0 - 10.5 K/uL 5.9  7.8  9.8   Hemoglobin 12.0 - 15.0 g/dL 8.5  8.3  8.8   Hematocrit 36.0 - 46.0 % 27.3  26.6  27.8   Platelets 150 - 400 K/uL 515  563  654         Latest Ref Rng & Units 02/01/2022    9:21 AM 01/12/2022    9:18 AM 12/22/2021    8:11 AM  CMP  Glucose 70 - 99 mg/dL 92  99  108   BUN 6 - 20 mg/dL 17  12  8    Creatinine 0.44 - 1.00 mg/dL 0.88   0.83  0.54   Sodium 135 - 145 mmol/L 138  138  141   Potassium 3.5 - 5.1 mmol/L 4.0  3.5  3.2   Chloride 98 - 111 mmol/L 107  103  107   CO2 22 - 32 mmol/L 26  27  26    Calcium 8.9 - 10.3 mg/dL 9.4  9.2  9.2   Total Protein 6.5 - 8.1 g/dL 7.6  8.1  7.1   Total Bilirubin 0.3 - 1.2 mg/dL 0.2  0.2  0.3   Alkaline Phos 38 - 126 U/L 307  106  59   AST 15 - 41 U/L 36  12  8   ALT 0 - 44 U/L 33  7  5       RADIOGRAPHIC STUDIES: I have personally reviewed the radiological images as listed and agreed with the findings in the report. No results found.    Orders Placed This Encounter  Procedures   CT CHEST ABDOMEN PELVIS W CONTRAST    Standing Status:   Future    Standing Expiration Date:   02/23/2023    Order Specific Question:   Is patient pregnant?    Answer:   No    Order Specific Question:   Preferred imaging location?    Answer:   [facility]    Order Specific Question:   Release to patient    Answer:   Immediate    Order Specific Question:   Is Oral Contrast requested for this exam?    Answer:   No oral contrast    Order Specific Question:   Reason for No Oral Contrast    Answer:   Other    Order Specific Question:   Please answer why no oral contrast is requested    Answer:   poor tolerance to oral contrast   CBC with Differential (Cancer Center Only)    Standing Status:   Future    Standing Expiration Date:   04/06/2023   CMP ([place] only)    Standing Status:   Future    Standing Expiration Date:   04/06/2023   All questions were answered. The patient knows to call the clinic with any problems, questions or concerns. No barriers to learning was detected. The total time spent in the appointment was 30 minutes.     Mikayla Merle, MD 02/22/2022   I, Wilburn Mylar, am acting as scribe for Mikayla Merle, MD.   I have reviewed the above documentation for accuracy and completeness, and I agree with the above.

## 2022-02-22 NOTE — Progress Notes (Signed)
[facility]  [phone] [phone]   Name: Mikayla Rios Date: 02/22/2022 MRN: [id number]  DOB: 08/28/1985  Patient Care Team: [name], MD as PCP - General (Internal Medicine) [name], MD as PCP - Cardiology (Cardiology) [name], [name] as Physician Assistant (Emergency Medicine) [name], MD as Consulting Physician (Oncology) [name], RN as Oncology Nurse Navigator (Oncology) [name], MD as Consulting Physician (General Surgery) [name], [name], MD as Consulting Physician (Neurology) [name], [name], MD as Consulting Physician (Obstetrics and Gynecology) [name], MD as Consulting Physician (Gastroenterology) [name], [name], NP as Nurse Practitioner (Nurse Practitioner)    INTERVAL HISTORY: Mikayla Rios is a 36 y.o. 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 Recent CT scan on 06/16/21 showed progressive metastatic disease. Palliative ask to see for symptom management.    SOCIAL HISTORY:     reports that she has never smoked. She has never used smokeless tobacco. She reports that she does not currently use alcohol. She reports that she does not use drugs.  ADVANCE DIRECTIVES:    CODE STATUS:   PAST MEDICAL HISTORY: Past Medical History:  Diagnosis Date   Allergy    seasonal   Anemia    Blood infection 1985   Blood transfusion without reported diagnosis    Diverticulitis    Family history of breast cancer    Family history of colon cancer    Family history of stomach cancer    Hypertension    Obesity    Sleep apnea     ALLERGIES:  is allergic to chlorhexidine gluconate, shellfish allergy, and penicillins.  MEDICATIONS:  Current Outpatient Medications  Medication Sig Dispense Refill   amLODipine (NORVASC) 10 MG  tablet Take 10 mg by mouth every evening.     DULoxetine (CYMBALTA) 60 MG capsule TAKE 1 CAPSULE BY MOUTH EVERY DAY 90 capsule 2   HYDROmorphone (DILAUDID) 2 MG tablet Take 1 tablet (2 mg total) by mouth every 6 (six) hours as needed for severe pain. 30 tablet 0   lidocaine-prilocaine (EMLA) cream Apply 1 Application topically as needed. 30 g 0   metoprolol tartrate 75 MG TABS Take 75 mg by mouth 2 (two) times daily. 90 tablet 3   potassium chloride 20 MEQ/15ML (10%) SOLN Take 45 mLs (60 mEq total) by mouth daily. 473 mL 3   POTASSIUM PO Take by mouth.     No current facility-administered medications for this visit.   Facility-Administered Medications Ordered in Other Visits  Medication Dose Route Frequency Provider Last Rate Last Admin   0.9 %  sodium chloride infusion   Intravenous Once PRN Thayil, Irene T, PA-C       albuterol (PROVENTIL) (2.5 MG/3ML) 0.083% nebulizer solution 2.5 mg  2.5 mg Nebulization Once PRN Dede Query T, PA-C       alteplase (CATHFLO ACTIVASE) injection 2 mg  2 mg Intracatheter Once PRN Dede Query T, PA-C       fentaNYL (SUBLIMAZE) injection   Intravenous PRN Mir, Paula Libra, MD   50 mcg at 10/02/21 1436   heparin lock flush 100 unit/mL  500 Units Intracatheter Once PRN [name], MD       heparin lock flush 100 unit/mL  500 Units Intracatheter Once PRN Dede Query T, PA-C       heparin lock flush 100 unit/mL  250 Units  Intracatheter Once PRN Dede Query T, PA-C       iron sucrose (VENOFER) 200 mg in sodium chloride 0.9 % 100 mL IVPB  200 mg Intravenous Once [name], MD 440 mL/hr at 02/22/22 1036 200 mg at 02/22/22 1036   midazolam (VERSED) injection   Intravenous PRN Mir, Paula Libra, MD   1 mg at 10/02/21 1436   sodium chloride flush (NS) 0.9 % injection 10 mL  10 mL Intracatheter PRN [name], MD       sodium chloride flush (NS) 0.9 % injection 10 mL  10 mL Intracatheter Once PRN Dede Query T, PA-C       sodium chloride flush (NS) 0.9 % injection 3 mL   3 mL Intracatheter Once PRN Dede Query T, PA-C        VITAL SIGNS: There were no vitals taken for this visit. There were no vitals filed for this visit.    Estimated body mass index is 33.5 kg/m as calculated from the following:   Height as of an earlier encounter on 02/22/22: '5\' 3"'$  (1.6 m).   Weight as of an earlier encounter on 02/22/22: 189 lb 1.6 oz (85.8 kg).   PERFORMANCE STATUS (ECOG) : 1 - Symptomatic but completely ambulatory   Physical Exam General: NAD Cardiovascular:RRR Respiratory: normal breathing pattern  Abdomen: soft, tender, + bowel sounds, colostomy in place Neurological: AAO x4, mood appropriate  IMPRESSION:  I saw Mikayla Rios during her infusion today. She was seen by Dr. Burr Medico today also. Continues to do well.  Mikayla Rios is continues to do well. Occasional fatigue. Appetite is good, which she is appreciative of. Gained 5lbs.   Neoplams related pain Pain is much improved. Does not require daily medications however does have pain on occassions that is not controlled with just Tylenol. Have been taking dilaudid from previous prescription which helps when she does have pain episodes. We discussed pain and medications. Given her pain is not daily would consider use of oxycodone/Tylenol vs continuing with dilaudid. She is tolerating Cymbalta which was recently increased to '60mg'$  by her PCP.   Will continue to closely monitor.   3. Constipation No concerns with constipation. Encouraged daily bowel regimen to prevent constipation.    PLAN: Oxycodone/APAP 7.'5mg'$ /'325mg'$  every 8 hours as needed for pain.  Cymbalta 60 mg twice daily Miralax daily for constipation I will plan to see her back in 2-3 weeks in collaboration with future appointments.   Patient expressed understanding and was in agreement with this plan. She also understands that She can call the clinic at any time with any questions, concerns, or complaints.   Time Total: 25 min   Visit consisted of  counseling and education dealing with the complex and emotionally intense issues of symptom management and palliative care in the setting of serious and potentially life-threatening illness.Greater than 50%  of this time was spent counseling and coordinating care related to the above assessment and plan.  Alda Lea, AGPCNP-BC  Palliative Medicine Team/[facility]

## 2022-02-22 NOTE — Progress Notes (Signed)
Patient declined to stay 30 minute post infusion observation. VSS, no signs of distress noted at discharge. 

## 2022-02-22 NOTE — Patient Instructions (Signed)
[facility]   Discharge Instructions: Thank you for choosing [facility] to provide your oncology and hematology care.   If you have a lab appointment with the Cancer Center, please go directly to the Cancer Center and check in at the registration area.   Wear comfortable clothing and clothing appropriate for easy access to any Portacath or PICC line.   We strive to give you quality time with your provider. You may need to reschedule your appointment if you arrive late (15 or more minutes).  Arriving late affects you and other patients whose appointments are after yours.  Also, if you miss three or more appointments without notifying the office, you may be dismissed from the clinic at the provider's discretion.      For prescription refill requests, have your pharmacy contact our office and allow 72 hours for refills to be completed.    Today you received the following chemotherapy and/or immunotherapy agents: Pembrolizumab (Keytruda)      To help prevent nausea and vomiting after your treatment, we encourage you to take your nausea medication as directed.  BELOW ARE SYMPTOMS THAT SHOULD BE REPORTED IMMEDIATELY: *FEVER GREATER THAN 100.4 F (38 C) OR HIGHER *CHILLS OR SWEATING *NAUSEA AND VOMITING THAT IS NOT CONTROLLED WITH YOUR NAUSEA MEDICATION *UNUSUAL SHORTNESS OF BREATH *UNUSUAL BRUISING OR BLEEDING *URINARY PROBLEMS (pain or burning when urinating, or frequent urination) *BOWEL PROBLEMS (unusual diarrhea, constipation, pain near the anus) TENDERNESS IN MOUTH AND THROAT WITH OR WITHOUT PRESENCE OF ULCERS (sore throat, sores in mouth, or a toothache) UNUSUAL RASH, SWELLING OR PAIN  UNUSUAL VAGINAL DISCHARGE OR ITCHING   Items with * indicate a potential emergency and should be followed up as soon as possible or go to the Emergency Department if any problems should occur.  Please show the CHEMOTHERAPY ALERT CARD or IMMUNOTHERAPY ALERT  CARD at check-in to the Emergency Department and triage nurse.  Should you have questions after your visit or need to cancel or reschedule your appointment, please contact [facility]  Dept: [phone]  and follow the prompts.  Office hours are 8:00 a.m. to 4:30 p.m. Monday - Friday. Please note that voicemails left after 4:00 p.m. may not be returned until the following business day.  We are closed weekends and major holidays. You have access to a nurse at all times for urgent questions. Please call the main number to the clinic Dept: [phone] and follow the prompts.   For any non-urgent questions, you may also contact your provider using MyChart. We now offer e-Visits for anyone 18 and older to request care online for non-urgent symptoms. For details visit [email or web address].   Also download the MyChart app! Go to the app store, search "MyChart", open the app, select [facility], and log in with your MyChart username and password.  Masks are optional in the cancer centers. If you would like for your care team to wear a mask while they are taking care of you, please let them know. You may have one support Chante Mayson who is at least 38 years old accompany you for your appointments. 

## 2022-03-02 ENCOUNTER — Other Ambulatory Visit: Payer: Self-pay | Admitting: Cardiology

## 2022-03-10 DIAGNOSIS — Z85038 Personal history of other malignant neoplasm of large intestine: Secondary | ICD-10-CM | POA: Diagnosis not present

## 2022-03-10 DIAGNOSIS — Z933 Colostomy status: Secondary | ICD-10-CM | POA: Diagnosis not present

## 2022-03-10 DIAGNOSIS — S31109A Unspecified open wound of abdominal wall, unspecified quadrant without penetration into peritoneal cavity, initial encounter: Secondary | ICD-10-CM | POA: Diagnosis not present

## 2022-03-10 DIAGNOSIS — Z4801 Encounter for change or removal of surgical wound dressing: Secondary | ICD-10-CM | POA: Diagnosis not present

## 2022-03-11 ENCOUNTER — Ambulatory Visit (HOSPITAL_COMMUNITY): Payer: BC Managed Care – PPO

## 2022-03-15 ENCOUNTER — Ambulatory Visit: Payer: BC Managed Care – PPO | Admitting: Physical Medicine and Rehabilitation

## 2022-03-15 ENCOUNTER — Ambulatory Visit (HOSPITAL_COMMUNITY)
Admission: RE | Admit: 2022-03-15 | Discharge: 2022-03-15 | Disposition: A | Discharge status: Home / Self Care | Payer: BC Managed Care – PPO | Source: Ambulatory Visit | Attending: Hematology | Admitting: Hematology

## 2022-03-15 DIAGNOSIS — C189 Malignant neoplasm of colon, unspecified: Secondary | ICD-10-CM | POA: Diagnosis not present

## 2022-03-15 DIAGNOSIS — J984 Other disorders of lung: Secondary | ICD-10-CM | POA: Diagnosis not present

## 2022-03-15 DIAGNOSIS — K8020 Calculus of gallbladder without cholecystitis without obstruction: Secondary | ICD-10-CM | POA: Diagnosis not present

## 2022-03-15 DIAGNOSIS — C187 Malignant neoplasm of sigmoid colon: Secondary | ICD-10-CM | POA: Insufficient documentation

## 2022-03-15 DIAGNOSIS — N1330 Unspecified hydronephrosis: Secondary | ICD-10-CM | POA: Diagnosis not present

## 2022-03-15 DIAGNOSIS — D259 Leiomyoma of uterus, unspecified: Secondary | ICD-10-CM | POA: Diagnosis not present

## 2022-03-15 DIAGNOSIS — C7951 Secondary malignant neoplasm of bone: Secondary | ICD-10-CM | POA: Diagnosis not present

## 2022-03-15 MED ORDER — IOHEXOL 300 MG/ML  SOLN
100.0000 mL | Freq: Once | INTRAMUSCULAR | Status: AC | PRN
  Administered 2022-03-15: 100 mL via INTRAVENOUS

## 2022-03-16 ENCOUNTER — Inpatient Hospital Stay (HOSPITAL_BASED_OUTPATIENT_CLINIC_OR_DEPARTMENT_OTHER): Payer: BC Managed Care – PPO | Admitting: Nurse Practitioner

## 2022-03-16 ENCOUNTER — Other Ambulatory Visit: Payer: Self-pay

## 2022-03-16 ENCOUNTER — Inpatient Hospital Stay: Payer: BC Managed Care – PPO

## 2022-03-16 ENCOUNTER — Encounter: Payer: Self-pay | Admitting: Nurse Practitioner

## 2022-03-16 ENCOUNTER — Inpatient Hospital Stay: Payer: BC Managed Care – PPO | Attending: Hematology | Admitting: Nurse Practitioner

## 2022-03-16 VITALS — BP 130/100 | HR 80 | Temp 98.2°F | Resp 16 | Wt 198.7 lb

## 2022-03-16 DIAGNOSIS — C7951 Secondary malignant neoplasm of bone: Secondary | ICD-10-CM | POA: Diagnosis not present

## 2022-03-16 DIAGNOSIS — D649 Anemia, unspecified: Secondary | ICD-10-CM | POA: Diagnosis not present

## 2022-03-16 DIAGNOSIS — Z803 Family history of malignant neoplasm of breast: Secondary | ICD-10-CM | POA: Diagnosis not present

## 2022-03-16 DIAGNOSIS — Z933 Colostomy status: Secondary | ICD-10-CM | POA: Diagnosis not present

## 2022-03-16 DIAGNOSIS — D259 Leiomyoma of uterus, unspecified: Secondary | ICD-10-CM | POA: Diagnosis not present

## 2022-03-16 DIAGNOSIS — D3502 Benign neoplasm of left adrenal gland: Secondary | ICD-10-CM | POA: Diagnosis not present

## 2022-03-16 DIAGNOSIS — Z515 Encounter for palliative care: Secondary | ICD-10-CM

## 2022-03-16 DIAGNOSIS — C786 Secondary malignant neoplasm of retroperitoneum and peritoneum: Secondary | ICD-10-CM | POA: Diagnosis not present

## 2022-03-16 DIAGNOSIS — M797 Fibromyalgia: Secondary | ICD-10-CM | POA: Diagnosis not present

## 2022-03-16 DIAGNOSIS — F419 Anxiety disorder, unspecified: Secondary | ICD-10-CM

## 2022-03-16 DIAGNOSIS — F32A Depression, unspecified: Secondary | ICD-10-CM | POA: Insufficient documentation

## 2022-03-16 DIAGNOSIS — R21 Rash and other nonspecific skin eruption: Secondary | ICD-10-CM | POA: Diagnosis not present

## 2022-03-16 DIAGNOSIS — K760 Fatty (change of) liver, not elsewhere classified: Secondary | ICD-10-CM | POA: Insufficient documentation

## 2022-03-16 DIAGNOSIS — G893 Neoplasm related pain (acute) (chronic): Secondary | ICD-10-CM | POA: Diagnosis not present

## 2022-03-16 DIAGNOSIS — K8020 Calculus of gallbladder without cholecystitis without obstruction: Secondary | ICD-10-CM | POA: Insufficient documentation

## 2022-03-16 DIAGNOSIS — Z79899 Other long term (current) drug therapy: Secondary | ICD-10-CM | POA: Insufficient documentation

## 2022-03-16 DIAGNOSIS — G4709 Other insomnia: Secondary | ICD-10-CM

## 2022-03-16 DIAGNOSIS — Z800 Family history of malignant neoplasm of digestive organs: Secondary | ICD-10-CM | POA: Insufficient documentation

## 2022-03-16 DIAGNOSIS — K5720 Diverticulitis of large intestine with perforation and abscess without bleeding: Secondary | ICD-10-CM | POA: Insufficient documentation

## 2022-03-16 DIAGNOSIS — N838 Other noninflammatory disorders of ovary, fallopian tube and broad ligament: Secondary | ICD-10-CM

## 2022-03-16 DIAGNOSIS — I10 Essential (primary) hypertension: Secondary | ICD-10-CM | POA: Insufficient documentation

## 2022-03-16 DIAGNOSIS — C7972 Secondary malignant neoplasm of left adrenal gland: Secondary | ICD-10-CM | POA: Diagnosis not present

## 2022-03-16 DIAGNOSIS — K5900 Constipation, unspecified: Secondary | ICD-10-CM | POA: Insufficient documentation

## 2022-03-16 DIAGNOSIS — Z452 Encounter for adjustment and management of vascular access device: Secondary | ICD-10-CM

## 2022-03-16 DIAGNOSIS — Z5112 Encounter for antineoplastic immunotherapy: Secondary | ICD-10-CM | POA: Insufficient documentation

## 2022-03-16 DIAGNOSIS — G4700 Insomnia, unspecified: Secondary | ICD-10-CM

## 2022-03-16 DIAGNOSIS — C187 Malignant neoplasm of sigmoid colon: Secondary | ICD-10-CM | POA: Diagnosis not present

## 2022-03-16 LAB — CBC WITH DIFFERENTIAL (CANCER CENTER ONLY)
Abs Immature Granulocytes: 0.01 10*3/uL (ref 0.00–0.07)
Basophils Absolute: 0 10*3/uL (ref 0.0–0.1)
Basophils Relative: 0 %
Eosinophils Absolute: 0.1 10*3/uL (ref 0.0–0.5)
Eosinophils Relative: 3 %
HCT: 29.1 % — ABNORMAL LOW (ref 36.0–46.0)
Hemoglobin: 9.3 g/dL — ABNORMAL LOW (ref 12.0–15.0)
Immature Granulocytes: 0 %
Lymphocytes Relative: 29 %
Lymphs Abs: 1.3 10*3/uL (ref 0.7–4.0)
MCH: 25.3 pg — ABNORMAL LOW (ref 26.0–34.0)
MCHC: 32 g/dL (ref 30.0–36.0)
MCV: 79.1 fL — ABNORMAL LOW (ref 80.0–100.0)
Monocytes Absolute: 0.3 10*3/uL (ref 0.1–1.0)
Monocytes Relative: 6 %
Neutro Abs: 2.9 10*3/uL (ref 1.7–7.7)
Neutrophils Relative %: 62 %
Platelet Count: 439 10*3/uL — ABNORMAL HIGH (ref 150–400)
RBC: 3.68 MIL/uL — ABNORMAL LOW (ref 3.87–5.11)
RDW: 18.8 % — ABNORMAL HIGH (ref 11.5–15.5)
WBC Count: 4.6 10*3/uL (ref 4.0–10.5)
nRBC: 0 % (ref 0.0–0.2)

## 2022-03-16 LAB — CMP (CANCER CENTER ONLY)
ALT: 12 U/L (ref 0–44)
AST: 12 U/L — ABNORMAL LOW (ref 15–41)
Albumin: 3.7 g/dL (ref 3.5–5.0)
Alkaline Phosphatase: 149 U/L — ABNORMAL HIGH (ref 38–126)
Anion gap: 4 — ABNORMAL LOW (ref 5–15)
BUN: 12 mg/dL (ref 6–20)
CO2: 25 mmol/L (ref 22–32)
Calcium: 8.7 mg/dL — ABNORMAL LOW (ref 8.9–10.3)
Chloride: 109 mmol/L (ref 98–111)
Creatinine: 0.67 mg/dL (ref 0.44–1.00)
GFR, Estimated: >60 mL/min (ref >60)
Glucose, Bld: 92 mg/dL (ref 70–99)
Potassium: 3.6 mmol/L (ref 3.5–5.1)
Sodium: 138 mmol/L (ref 135–145)
Total Bilirubin: 0.3 mg/dL (ref 0.3–1.2)
Total Protein: 7.4 g/dL (ref 6.5–8.1)

## 2022-03-16 LAB — FERRITIN: Ferritin: 47 ng/mL (ref 11–307)

## 2022-03-16 LAB — IRON AND IRON BINDING CAPACITY (CC-WL,HP ONLY)
Iron: 27 ug/dL — ABNORMAL LOW (ref 28–170)
Saturation Ratios: 11 % (ref 10.4–31.8)
TIBC: 256 ug/dL (ref 250–450)
UIBC: 229 ug/dL (ref 148–442)

## 2022-03-16 LAB — TSH: TSH: 2.147 u[IU]/mL (ref 0.350–4.500)

## 2022-03-16 LAB — PREGNANCY, URINE: Preg Test, Ur: NEGATIVE

## 2022-03-16 LAB — CEA (IN HOUSE-CHCC): CEA (CHCC-In House): <1 ng/mL (ref 0.00–5.00)

## 2022-03-16 MED ORDER — ALPRAZOLAM 0.25 MG PO TABS: 0.2500 mg | ORAL_TABLET | Freq: Every evening | ORAL | 0 refills | Status: AC | PRN

## 2022-03-16 MED ORDER — SODIUM CHLORIDE 0.9 % IV SOLN
200.0000 mg | Freq: Once | INTRAVENOUS | Status: AC
  Administered 2022-03-16: 200 mg via INTRAVENOUS
  Filled 2022-03-16: qty 200

## 2022-03-16 MED ORDER — SODIUM CHLORIDE 0.9% FLUSH
10.0000 mL | Freq: Once | INTRAVENOUS | Status: AC
  Administered 2022-03-16: 10 mL

## 2022-03-16 MED ORDER — SODIUM CHLORIDE 0.9 % IV SOLN: Freq: Once | INTRAVENOUS | Status: AC

## 2022-03-16 NOTE — Progress Notes (Addendum)
[place]   [phone] [phone]   Clinic Follow up Note   Patient Care Team: [name], MD as PCP - General (Internal Medicine) [name], MD as PCP - Cardiology (Cardiology) [name], [name] as Physician Assistant (Emergency Medicine) [name], MD as Consulting Physician (Oncology) [name], MD as Consulting Physician (General Surgery) [name], [name], MD as Consulting Physician (Neurology) [name], [name], MD as Consulting Physician (Obstetrics and Gynecology) [name], MD as Consulting Physician (Gastroenterology) [name], [name], NP as Nurse Practitioner (Nurse Practitioner) 03/16/2022  CHIEF COMPLAINT: Follow-up sigmoid colon cancer  SUMMARY OF ONCOLOGIC HISTORY: Oncology History Overview Note   Cancer Staging  Malignant neoplasm of sigmoid colon [facility]) Staging form: Colon and Rectum, AJCC 8th Edition - Pathologic stage from 04/15/2021: Stage IIC (pT4b, pN0, cM0) - Signed by [name], MD on 06/12/2021    Malignant neoplasm of sigmoid colon [facility])   Initial Diagnosis   Malignant neoplasm of sigmoid colon ([place])   11/04/2020 Imaging   EXAM: CT ABDOMEN AND PELVIS WITHOUT CONTRAST  IMPRESSION: 1. Perforating descending colonic diverticulitis with multiple abdominopelvic gas and fluid collections, measuring up to 5.7 cm and further described above. 2. Cholelithiasis without findings of acute cholecystitis. 3. 3.6 cm benign left adrenal adenoma. 4. Leiomyomatous uterus. 5. 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   11/07/2020 Imaging   EXAM: CT ABDOMEN AND PELVIS WITHOUT CONTRAST  IMPRESSION: Continued wall thickening is seen involving descending colon suggesting infectious or inflammatory colitis or perforated diverticulitis. There is an adjacent fluid collection measuring  7.0 x 5.0 cm consistent with abscess which is significantly enlarged compared to prior exam. Adjacent to the abscess, there is a severely thickened small bowel loop most consistent with secondary inflammation.   5.2 x 3.5 cm fluid collection is noted within the left psoas muscle consistent with abscess which is significantly enlarged compared to prior exam. 4.4 x 3.8 cm fluid collection consistent with abscess is noted in the left retroperitoneal region which is also enlarged compared to prior exam.   Fibroid uterus.   Cholelithiasis.   11/27/2020 Imaging   EXAM: CT ABDOMEN AND PELVIS WITHOUT CONTRAST  IMPRESSION: 1. Significantly interval decreased size of the previously visualized left retroperitoneal and left anterior mid abdominal fluid collections. There is persistent fat stranding and mural thickening of the mid descending colon at this level. 2. Unchanged leiomyomatous uterus. 3. Unchanged cholelithiasis.   12/11/2020 Imaging   EXAM: CT ABDOMEN AND PELVIS WITHOUT CONTRAST  IMPRESSION: 1. Stable inflammatory changes centered around the distal descending colon in the left lower abdomen. Pericolonic inflammatory changes have minimally changed since 11/27/2020. Small pocket of gas medial to the colon is probably associated with a fistula or small residual abscess collection. 2. Stable position of the two percutaneous drains. The more anterior drain may be extending through a portion of the small bowel. 3. Cholelithiasis. 4. Fibroid uterus. Cannot exclude an ovarian/adnexal cystic structure near the uterine fundus. 5. Left adrenal adenoma.   01/07/2021 Imaging   EXAM: CT ABDOMEN AND PELVIS WITH CONTRAST  IMPRESSION: 1. No new abscesses identified. Similar degree of soft tissue thickening seen in the left pericolonic region. The degree of persistent soft tissues thickening in the pericolonic space further raises suspicions for malignancy. Further evaluation with colonoscopy  should be performed. 2. Left retroperitoneal abscess drain unchanged in position. Anterior left abdominal drain again seen terminating within small  bowel loop. 3. 2.6 cm mildly sclerotic lesion noted in the L2 vertebral body. Further evaluation with contrast enhanced lumbar spine MRI should be performed.   02/04/2021 Imaging   EXAM: CT ABDOMEN AND PELVIS WITH CONTRAST  IMPRESSION: No new abdominopelvic collections or abscess development in the 1 month interval.   Left anterior drain remains within a loop of small bowel, unchanged.   Left lateral abscess drain remains in the retroperitoneal space adjacent to the iliopsoas muscle with a small amount of residual fluid and air but no measurable collection.   Stable soft tissue prominence and pericolonic strandy edema/inflammation about the left descending colon compatible with residual diverticulitis/colitis. Difficult to exclude underlying transmural lesion.   04/01/2021 Imaging   EXAM: CT ABDOMEN AND PELVIS WITH CONTRAST  IMPRESSION: There appears to be significant enhancement and wall thickening involving the descending colon with some degree of traction and involvement of adjacent small bowel loops. This is consistent with the history of diverticulitis and perforation. Stable position of percutaneous drainage catheter is seen adjacent to left psoas muscle with no significant residual fluid remaining. The other percutaneous drainage catheter that was previously noted to be within small bowel loop on prior exam in the left lower quadrant, has significantly retracted and appears to be outside of the peritoneal space at this time.   Since the prior exam, there does appear to be some degree of rotation involving mesenteric vessels and structures in the right lower quadrant, suggesting partial volvulus or malrotation. There is the interval development of several lymph nodes in this area, most likely inflammatory or reactive in  etiology. Mild amount of free fluid is also noted in the pelvis. However, no significant bowel wall thickening or dilatation is seen in this area. These results will be called to the ordering clinician or representative by the Radiologist Assistant, and communication documented in the PACS or zVision Dashboard.   Stable uterine fibroid.   Hepatic steatosis.   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   Cholelithiasis.   04/10/2021 Imaging   EXAM: CT ANGIOGRAPHY CHEST CT ABDOMEN AND PELVIS WITH CONTRAST  IMPRESSION: 1. Again seen are findings compatible with descending colon diverticulitis with perforation. Free air and inflammation have increased in the interval. 2. New lobulated enhancing fluid collection posterior to this segment of inflamed colon measuring 8.5 x 3.5 x 10.0 cm. This collection now invades the adjacent iliopsoas muscle as well as extends through the left lateral abdominal wall. The tip of the drainage catheter is in this collection. Findings are compatible with abscess. 3. Anterior left percutaneous drainage catheter tip has been pulled back and is now within the subcutaneous tissues. 4. Trace free fluid. 5. No pulmonary embolism.  No acute cardiopulmonary process. 6. Cholelithiasis. 7. Fatty infiltration of the liver.   04/15/2021 Definitive Surgery   FINAL MICROSCOPIC DIAGNOSIS:   A. SMALL BOWEL, RESECTION:  - Adenocarcinoma.  - No carcinoma identified in 1 lymph node.   B. PERFORATED LEFT COLON, RESECTION:  - Moderately differentiated colonic adenocarcinoma.  - Tumor extends into pericolonic adipose tissue, and is strongly  suggestive of invasion into small bowel.  See oncology table/comments.  - No carcinoma identified in 4 lymph nodes (0/4).  - Tubular adenoma with high-grade dysplasia, 1.  - Tubular adenomas with low grade dysplasia, 3.   Comments: The size of the tumor is difficult to estimate secondary to the disrupted nature of the specimen, as  well as the infiltrative nature of the tumor.  Tumor can be identified  as definitely invading into the pericolonic adipose tissue (block B4), but given the extreme disruption of the tissue, and the involvement of the small bowel by what appears to be a colonic adenocarcinoma, I favor perforation of the large bowel with direct invasion into the small bowel.  Accordingly, I believe this is best regarded as a pT4b lesion.   ADDENDUM:  Mismatch Repair Protein (IHC)  SUMMARY INTERPRETATION: ABNORMAL  There is loss of the major MMR protein [name]6: This indicates a high probability that a hereditary germline mutation is present and referral to genetic counseling is warranted. It is recommended that the loss of protein expression be correlated with molecular based microsatellite instability testing.   IHC EXPRESSION RESULTS  TEST           RESULT  MLH1:          Preserved nuclear expression  [name]2:          Preserved nuclear expression  [name]6:          LOSS OF NUCLEAR EXPRESSION  PMS2:          Preserved nuclear expression    04/15/2021 Cancer Staging   Staging form: Colon and Rectum, AJCC 8th Edition - Pathologic stage from 04/15/2021: Stage IIC (pT4b, pN0, cM0) - Signed by [name], MD on 06/12/2021 Stage prefix: Initial diagnosis Total positive nodes: 0 Histologic grading system: 4 grade system Histologic grade (G): G2 Residual tumor (R): R0 - None   04/29/2021 Imaging   EXAM: CT ABDOMEN AND PELVIS WITH CONTRAST  IMPRESSION: Continued left retroperitoneal abscesses inferior to the left kidney, involving the left psoas muscle and left abdominal wall musculature. Overall size is decreased since prior study. Interval removal of left lower quadrant abscess drainage catheter.   New fluid collection in the cul-de-sac and wrapping around the uterus concerning for abscess.   Cholelithiasis.   Small left pleural effusion.  Bibasilar atelectasis.   Stable left adrenal adenoma.  ADDENDUM: After  discussing the case with Dr. Dwaine Gale in interventional radiology, it was noted that the fluid in the pelvis originally thought to be in the cul-de-sac is likely within the vagina, best seen on sagittal imaging. Recommend speculum exam.   Also, in the left lateral wall in the area of prior abscess in the lateral wall abdominal musculature, some of the abdomen soft tissue appears to be enhancing. While this could be infectious, cannot exclude tumor seeding in the left lateral abdominal wall, measuring 6.9 x 3.8 cm on image 64 of series 2.   06/16/2021 Imaging   EXAM: CT ABDOMEN AND PELVIS WITH CONTRAST  IMPRESSION: Increased size of bulky soft tissue masses involving the left psoas muscle and left lateral abdominal wall soft tissues, consistent with progressive metastatic disease.   Significant progression of multiple peritoneal masses throughout the abdomen and pelvis, consistent with peritoneal carcinomatosis.   New mild retroperitoneal lymphadenopathy, consistent with metastatic disease.   Stable uterine fibroid and left adrenal adenoma.   Cholelithiasis, without evidence of cholecystitis.   06/22/2021 - 08/19/2021 Chemotherapy   Patient is on Treatment Plan : COLORECTAL FOLFOX q14d x 6 months     06/22/2021 Tumor Marker   Patient's tumor was tested for the following markers: CEA. Results of the tumor marker test revealed 87.41.   07/10/2021 Pathology Results   FINAL MICROSCOPIC DIAGNOSIS:   A. PERITONEAL MASS, LEFT UPPER ABDOMINAL QUADRANT, BIOPSY:  -  Metastatic adenocarcinoma with necrosis, histologically consistent with colon primary.     Genetic Testing  Pathogenic variant in APC called c.1312+3A>G identified on the [employer] CancerNext-Expanded+RNA panel. The report date is 07/16/2021. The remainder of testing was negative/normal.  The CancerNext-Expanded + RNAinsight gene panel offered by [employer] and includes sequencing and rearrangement analysis for the following 77 genes:  IP, ALK, APC*, ATM*, AXIN2, BAP1, BARD1, BLM, BMPR1A, BRCA1*, BRCA2*, BRIP1*, CDC73, CDH1*,CDK4, CDKN1B, CDKN2A, CHEK2*, CTNNA1, DICER1, FANCC, FH, FLCN, GALNT12, KIF1B, LZTR1, MAX, MEN1, MET, MLH1*, [name]2*, [name]3, [name]6*, MUTYH*, NBN, NF1*, NF2, NTHL1, PALB2*, PHOX2B, PMS2*, POT1, PRKAR1A, PTCH1, PTEN*, RAD51C*, RAD51D*,RB1, RECQL, RET, SDHA, SDHAF2, SDHB, SDHC, SDHD, SMAD4, SMARCA4, SMARCB1, SMARCE1, STK11, SUFU, TMEM127, TP53*,TSC1, TSC2, VHL and XRCC2 (sequencing and deletion/duplication); EGFR, EGLN1, HOXB13, KIT, MITF, PDGFRA, POLD1 and POLE (sequencing only); EPCAM and GREM1 (deletion/duplication only).   08/28/2021 Imaging   EXAM: CT CHEST, ABDOMEN, AND PELVIS WITH CONTRAST  IMPRESSION: 1. Continued interval progression of multiple metastatic soft tissue nodules/masses in the abdomen and pelvis. 2. No evidence for metastatic disease in the chest. 3. Similar appearance of the subtle lesion in the L2 vertebral body, suspicious for metastatic involvement. 4. Cholelithiasis.   09/03/2021 -  Chemotherapy   Patient is on Treatment Plan : COLORECTAL Pembrolizumab (200) q21d     09/08/2021 - 02/01/2022 Chemotherapy   Patient is on Treatment Plan : COLORECTAL Pembrolizumab (200) q21d     01/08/2022 Imaging   CT CAP IMPRESSION: 1. Mixed response to therapy. Interval decrease in size of the conglomerate multilobulated mass extending from the left psoas into the left posterior pararenal space through the left lateral abdominal wall along the tract of the previously placed percutaneous drainage catheter. Interval decrease in size of right adnexal mass and omental masses within the right lower quadrant of the abdomen. Interval development of peritoneal carcinomatosis with omental caking within the left lower quadrant of the abdomen and left subdiaphragmatic region adjacent to the spleen and along the left pericolic gutter as well as development of ascites appearing loculated within the anterior peritoneum. 2.  Interval development of mild left hydronephrosis secondary to stricturing of the mid left ureter. 3. Stable left adrenal metastasis. 4. Stable sclerotic lesion within the L2 vertebral body. No new lytic or blastic bone lesions are seen. 5. Cholelithiasis. 6. No evidence of intrathoracic metastatic disease. 7. 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 Moderate colonic stool burden. No evidence of obstruction.   03/15/2022 Imaging   IMPRESSION: 1. Status post sigmoid colon resection with left lower quadrant end colostomy. 2. Multicystic bilateral ovarian lesions are slightly increased in size. 3. Diminished, small volume of loculated appearing ascites throughout the abdomen and pelvis, with similar appearance of peritoneal thickening and nodularity throughout. 4. Heterogeneously calcified, conglomerate masses in the left retroperitoneum and abdominal wall are slightly diminished in size. 5. Left adrenal lesion not significantly changed. 6. Unchanged faintly sclerotic metastatic lesion of L2. 7. Constellation of findings is consistent with mixed response to treatment however with overall little interval change. 8. Minimal residual left hydronephrosis and proximal hydroureter, improved compared to prior examination. 9. Cholelithiasis. 10. Coronary artery disease, significantly advanced for patient age.   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 Sprick returns for follow-up and treatment as scheduled, last seen by Dr.  Burr Medico 02/22/2022 and completed another cycle of pembrolizumab (total of her 9th cycle).  She is feeling well in general, energy level fluctuates but adequate to be  out of bed and up at home.  Iron infusion last week gave her a little boost.  Fibro flare has improved so she feels she tolerates treatment better overall now.  Able to eat and drink well, pain is minimal. Denies pelvic pain. Takes percocet once a week or so. Bowels are soft/loose at baseline.  She noticed a slightly raised itchy rash at her elbow folds.  Also with some breakdown at her ostomy site.  Denies recent fever, chills, cough, dyspnea.  She underwent restaging CT CAP yesterday.   All other systems were reviewed with the patient and are negative.  MEDICAL HISTORY:  Past Medical History:  Diagnosis Date   Allergy    seasonal   Anemia    Blood infection 1985   Blood transfusion without reported diagnosis    Diverticulitis    Family history of breast cancer    Family history of colon cancer    Family history of stomach cancer    Hypertension    Obesity    Sleep apnea     SURGICAL HISTORY: Past Surgical History:  Procedure Laterality Date   COLECTOMY WITH COLOSTOMY CREATION/HARTMANN PROCEDURE N/A 04/15/2021   Procedure: COLOSTOMY CREATION/HARTMANN PROCEDURE;  Surgeon: [name], MD;  Location: [facility];  Service: General;  Laterality: N/A;   IR CATHETER TUBE CHANGE  12/12/2020   IR CATHETER TUBE CHANGE  01/27/2021   IR CATHETER TUBE CHANGE  02/19/2021   IR CATHETER TUBE CHANGE  04/13/2021   IR IMAGING GUIDED PORT INSERTION  10/02/2021   IR RADIOLOGIST EVAL & MGMT  11/27/2020   IR RADIOLOGIST EVAL & MGMT  12/11/2020   IR RADIOLOGIST EVAL & MGMT  01/07/2021   IR RADIOLOGIST EVAL & MGMT  02/04/2021   IR SINUS/FIST TUBE CHK-NON GI  12/12/2020   IR SINUS/FIST TUBE CHK-NON GI  02/19/2021   LAPAROSCOPIC PARTIAL COLECTOMY N/A 04/15/2021   Procedure: LAPAROSCOPIC ASSISTED HARTMANN RESECTION;  Surgeon: [name], MD;  Location: [facility];  Service: General;  Laterality: N/A;    I have reviewed the social history and family history with the patient and they are unchanged from  previous note.  ALLERGIES:  is allergic to chlorhexidine gluconate, shellfish allergy, and penicillins.  MEDICATIONS:  Current Outpatient Medications  Medication Sig Dispense Refill   amLODipine (NORVASC) 10 MG tablet TAKE 1 TABLET BY MOUTH EVERY DAY 90 tablet 3   DULoxetine (CYMBALTA) 60 MG capsule TAKE 1 CAPSULE BY MOUTH EVERY DAY 90 capsule 2   lidocaine-prilocaine (EMLA) cream Apply 1 Application topically as needed. 30 g 0   metoprolol tartrate 75 MG TABS Take 75 mg by mouth 2 (two) times daily. 90 tablet 3   oxyCODONE-acetaminophen (PERCOCET) 7.5-325 MG tablet Take 1 tablet by mouth every 8 (eight) hours as needed for severe pain. 30 tablet 0   potassium chloride 20 MEQ/15ML (10%) SOLN Take 45 mLs (60 mEq total) by mouth daily. 473 mL 3   POTASSIUM PO Take by mouth.     No current facility-administered medications for this visit.   Facility-Administered Medications Ordered in Other Visits  Medication Dose Route Frequency Provider Last Rate Last Admin   fentaNYL (SUBLIMAZE) injection   Intravenous PRN Mir, Paula Libra, MD   50 mcg at 10/02/21 1436   midazolam (VERSED) injection   Intravenous PRN Mir, Paula Libra, MD  1 mg at 10/02/21 1436   pembrolizumab (KEYTRUDA) 200 mg in sodium chloride 0.9 % 50 mL chemo infusion  200 mg Intravenous Once [name], MD        PHYSICAL EXAMINATION: ECOG PERFORMANCE STATUS: 1 - Symptomatic but completely ambulatory  Vitals:   03/16/22 1009  BP: (!) 130/100  Pulse: 80  Resp: 16  Temp: 98.2 F (36.8 C)  SpO2: 100%   Filed Weights   03/16/22 1009  Weight: 198 lb 11.2 oz (90.1 kg)    GENERAL:alert, no distress and comfortable SKIN: Small localized mild raised rash to antecubital fossa EYES:  sclera clear LUNGS: clear with normal breathing effort HEART: regular rate & rhythm, no lower extremity edema ABDOMEN:abdomen soft, non-tender and normal bowel sounds NEURO: alert & oriented x 3 with fluent speech, no focal motor/sensory  deficits PAC without erythema   LABORATORY DATA:  I have reviewed the data as listed    Latest Ref Rng & Units 03/16/2022    9:28 AM 02/22/2022    8:19 AM 02/01/2022    9:21 AM  CBC  WBC 4.0 - 10.5 K/uL 4.6  5.9  7.8   Hemoglobin 12.0 - 15.0 g/dL 9.3  8.5  8.3   Hematocrit 36.0 - 46.0 % 29.1  27.3  26.6   Platelets 150 - 400 K/uL 439  515  563         Latest Ref Rng & Units 03/16/2022    9:28 AM 02/22/2022    8:19 AM 02/01/2022    9:21 AM  CMP  Glucose 70 - 99 mg/dL 92  91  92   BUN 6 - 20 mg/dL 12  19  17    Creatinine 0.44 - 1.00 mg/dL 0.67  0.73  0.88   Sodium 135 - 145 mmol/L 138  140  138   Potassium 3.5 - 5.1 mmol/L 3.6  3.6  4.0   Chloride 98 - 111 mmol/L 109  109  107   CO2 22 - 32 mmol/L 25  23  26    Calcium 8.9 - 10.3 mg/dL 8.7  9.1  9.4   Total Protein 6.5 - 8.1 g/dL 7.4  7.7  7.6   Total Bilirubin 0.3 - 1.2 mg/dL 0.3  0.4  0.2   Alkaline Phos 38 - 126 U/L 149  268  307   AST 15 - 41 U/L 12  16  36   ALT 0 - 44 U/L 12  22  33       RADIOGRAPHIC STUDIES: I have personally reviewed the radiological images as listed and agreed with the findings in the report. CT CHEST ABDOMEN PELVIS W CONTRAST  Result Date: 03/16/2022 CLINICAL DATA:  Metastatic colon cancer restaging, assess treatment response * Tracking Code: BO * EXAM: CT CHEST, ABDOMEN, AND PELVIS WITH CONTRAST TECHNIQUE: Multidetector CT imaging of the chest, abdomen and pelvis was performed following the standard protocol during bolus administration of intravenous contrast. RADIATION DOSE REDUCTION: This exam was performed according to the departmental dose-optimization program which includes automated exposure control, adjustment of the mA and/or kV according to patient size and/or use of iterative reconstruction technique. CONTRAST:  127m OMNIPAQUE IOHEXOL 300 MG/ML  SOLN COMPARISON:  01/08/2022 FINDINGS: CT CHEST FINDINGS Cardiovascular: Right chest port catheter. Normal heart size. Left coronary artery  calcifications. No pericardial effusion. Mediastinum/Nodes: No enlarged mediastinal, hilar, or axillary lymph nodes. PICC remnant in the anterior mediastinum. Thyroid gland, trachea, and esophagus demonstrate no significant findings. Lungs/Pleura: Minimal, bandlike scarring of  the lung bases. No pleural effusion or pneumothorax. Musculoskeletal: No chest wall abnormality. No acute osseous findings. CT ABDOMEN PELVIS FINDINGS Hepatobiliary: No solid liver abnormality is seen. Gallstones. No gallbladder wall thickening, or biliary dilatation. Pancreas: Unremarkable. No pancreatic ductal dilatation or surrounding inflammatory changes. Spleen: Normal in size without significant abnormality. Adrenals/Urinary Tract: Left adrenal lesion is not significantly changed, measuring 3.5 x 2.8 cm (series 2, image 55). Normal right adrenal. Minimal residual left hydronephrosis and proximal hydroureter, improved compared to prior examination (series 2, image 67). The right kidney is normal, without renal calculi, solid lesion, or hydronephrosis. Bladder is unremarkable. Stomach/Bowel: Stomach is within normal limits. Appendix appears normal. Status post sigmoid colon resection with left lower quadrant end colostomy. Additional small bowel resection and reanastomosis in the right hemiabdomen. Vascular/Lymphatic: No significant vascular findings are present. No enlarged abdominal or pelvic lymph nodes. Reproductive: Uterine fibroids. Multicystic bilateral ovarian lesions are slightly increased in size, measuring 7.5 x 6.1 cm on the left, previously 6.6 x 6.1 cm (series 2, image 91) and 6.2 x 3.4 cm on the right, previously 5.5 x 3.2 cm (series 2, image 90). Other: No abdominal wall hernia or abnormality. Diminished, small volume of loculated appearing ascites throughout the abdomen and pelvis, with similar appearance of peritoneal thickening and nodularity, particularly evident in the left hemiabdomen (series 2, image 71).  Heterogeneously calcified, conglomerate masses in the left retroperitoneum and abdominal wall are slightly diminished in size, largest mass subtending the left hemi abdominal wall measuring 9.2 x 2.5 cm, previously 10.0 x 3.0 cm when measured similarly (series 2, image 80), and lesion within the left psoas measuring 3.4 x 3.1 cm, previously 3.9 x 3.8 cm (series 2, image 85). Musculoskeletal: No acute osseous findings. Unchanged faintly sclerotic lesion of L2 (series 6, image 130). IMPRESSION: 1. Status post sigmoid colon resection with left lower quadrant end colostomy. 2. Multicystic bilateral ovarian lesions are slightly increased in size. 3. Diminished, small volume of loculated appearing ascites throughout the abdomen and pelvis, with similar appearance of peritoneal thickening and nodularity throughout. 4. Heterogeneously calcified, conglomerate masses in the left retroperitoneum and abdominal wall are slightly diminished in size. 5. Left adrenal lesion not significantly changed. 6. Unchanged faintly sclerotic metastatic lesion of L2. 7. Constellation of findings is consistent with mixed response to treatment however with overall little interval change. 8. Minimal residual left hydronephrosis and proximal hydroureter, improved compared to prior examination. 9. Cholelithiasis. 10. Coronary artery disease, significantly advanced for patient age. Electronically Signed   By: Delanna Ahmadi M.D.   On: 03/16/2022 09:14       ASSESSMENT & PLAN: Mikayla Rios is a 36 y.o. female with    1. 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 She was found to have diverticulitis with perforation. She also developed an abscess requiring 2 drains. She continued to have abdominal pain and drainage, undergoing drain exchange three times. -she developed worsening weakness and was admitted on 04/10/21. She was taken for emergent small bowel  and left colon resection on 04/15/21 under Dr. Johney Maine. 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 Margins negative for invasive carcinoma, but low-grade dysplasia involves proximal margin. All 5 lymph nodes negative (0/5). MSI low. -repeat CT AP on 06/16/21 showed progressive metastatic disease involving soft tissue masses in left psoas muscle and left lateral abdominal wall and peritoneal masses, and new mild retroperitoneal  lymphadenopathy.  -baseline CEA on 06/22/21 was elevated at 87.41. -biopsy of the LUQ peritoneal mass on 07/10/21 confirmed metastatic adenocarcinoma with necrosis, consistent with colon primary. -FO showed MSI-H disease, BRCA, [name]6, and APC mutations among others which are not targetable. 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  Due to BRCA mutation on FO, she may be a candidate for PARP inhibitor down the line -she progressed on first line FOLFOX (06/22/21 - 08/17/21) and switch to second line pembrolizumab q. 21 days starting 09/08/2021 -Restaging CT CAP from 01/08/2022 decreased size of the mass involving the left psoas muscle, improved omental and ovarian deposits, with slightly increased omental caking in the left lower abdomen.  No new metastatic disease.  Overall consistent with a partial treatment response.  She has improved clinically and CEA normalized on immunotherapy - Mikayla Rios appears stable. S/p 9 cycles of pembrolizumab which she tolerates moderately well overall, mainly with fatigue. Fibromyalgia flare has improved. She has mild new rash at the left [facility], I recommend topical benadryl or hydrocortisone. Will monitor closely.  -Side effects are adequately managed with supportive care at home.  She is able to recover and function well. -I reviewed her restaging CT CAP from 10/9 which shows stable left adrenal lesion, decreased  size of left psoas lesion and omental nodularity, but slightly increased size of multicystic bilateral ovarian lesions.  Overall stable disease, no new metastasis -We discussed pursuing work-up of the ovarian lesions which are suspicious for but not definitely metastatic disease. She is being referred for pelvic ultrasound. Once we have results will discuss with GYN oncology team.  We also discussed the possibility of a PET scan.  Patient agrees with the plan -The recommendation is to continue pembrolizumab q21 days, labs adequate for treatment today. Anemia and thrombocytopenia improved after recent IV iron, will add on another IV iron infusion with next cycle.  -Patient seen with Dr. Burr Medico   2. Symptom Management: Abdominal pain, Weight loss, rash -she continues to have severe pain at the incision site. She saw Dr. Johney Maine on 06/11/21, who said the pain could last up to a year due to the complexity of her surgery. -Pain management per palliative care.  Well managed on Percocet which she uses rarely -She has a new mild rash at the left antecubital fossa (03/16/2022).  Possibly fungal rash in the intertriginous area versus immunotherapy.  I recommend topical Benadryl or hydrocortisone for now and will monitor closely   3. Anxiety and depression -Due to the prolonged illness and multiple hospital stay since May 2022, and cancer diagnosis, she has been feeling depressed lately. -cymbalta prescribed 05/25/21 -She had panic attack 2/13 after chemo, when she was reacting to clorhexidine.  -F/up Dr. Michail Sermon -Stable lately   4. 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 However her MSI was low, instead of high, which is unusual for Lynch syndrome. -FO showed MSI-High disease, and showed multiple other mutations including APC, [name]6, and BRCA mutations.  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  -We previously discussed genetic testing for her spouse and children   5. Social Support -she is connected with Education [employer], [employer] -she is working to apply for disability. Her job ended just prior to the start of her symptoms. -she has three children-- ages 70, 53, and 53.  Plan: -Restaging CT CAP and today's labs reviewed -Proceed with pembrolizumab today and continue q. 21 days -pelvic US this week to evaluate cystic ovarian lesions, then discuss with gyn/onc team  -F/up and next cycle in 3 weeks, will add Venofer   All questions were answered. The patient knows to call the clinic with any problems, questions or concerns. No barriers to learning were detected.     Alla Feeling, NP 03/16/22   Addendum I have seen the patient, examined her. I agree with the assessment and and plan and have edited the notes.   Ece is clinically doing well.  I personally reviewed her restaging CT scan images and discussed the findings with patient and her husband today.  Her retroperitoneal and abdominal wall metastatic lesions are slightly smaller, however her bilateral ovarian masses are enlarging.  We are not sure if her ovarian masses are metastatic lesions from her colon cancer, or other etiology. I recommend pelvic and vaginal ultrasound for further recommendation.  We will check CA125 also.  Plan to discuss in GYN tumor board after that. Will continue Keytruda for now.  Patient agrees with the plan.  All questions were answered. Pt unfortunately lost her mother who died from metastatic colon cancer, emotional support given. SW will follow her for counseling.  [name]  03/16/2022

## 2022-03-16 NOTE — Addendum Note (Signed)
Addended by: [name] on: 03/16/2022 10:43 PM   Modules accepted: Orders

## 2022-03-16 NOTE — Progress Notes (Signed)
[facility]  Telephone:([phone]) [phone] [phone]) [phone]   Name: ALAZAE CRYMES Date: 03/16/2022 MRN: [id number]  DOB: 16-Aug-1985  Patient Care Team: [name], MD as PCP - General (Internal Medicine) [name], MD as PCP - Cardiology (Cardiology) [name], [name] as Physician Assistant (Emergency Medicine) [name], MD as Consulting Physician (Oncology) [name], MD as Consulting Physician (General Surgery) [name], [name], MD as Consulting Physician (Neurology) [name], [name], MD as Consulting Physician (Obstetrics and Gynecology) [name], MD as Consulting Physician (Gastroenterology) [name], [name], NP as Nurse Practitioner (Nurse Practitioner)    INTERVAL HISTORY: Mikayla Rios is a 36 y.o. 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 Recent CT scan on 06/16/21 showed progressive metastatic disease. Palliative ask to see for symptom management.    SOCIAL HISTORY:     reports that she has never smoked. She has never used smokeless tobacco. She reports that she does not currently use alcohol. She reports that she does not use drugs.  ADVANCE DIRECTIVES:    CODE STATUS:   PAST MEDICAL HISTORY: Past Medical History:  Diagnosis Date   Allergy    seasonal   Anemia    Blood infection 1985   Blood transfusion without reported diagnosis    Diverticulitis    Family history of breast cancer    Family history of colon cancer    Family history of stomach cancer    Hypertension    Obesity    Sleep apnea     ALLERGIES:  is allergic to chlorhexidine gluconate, shellfish allergy, and penicillins.  MEDICATIONS:  Current Outpatient Medications  Medication Sig Dispense Refill   amLODipine (NORVASC) 10 MG tablet TAKE 1 TABLET BY MOUTH EVERY DAY 90 tablet 3    DULoxetine (CYMBALTA) 60 MG capsule TAKE 1 CAPSULE BY MOUTH EVERY DAY 90 capsule 2   lidocaine-prilocaine (EMLA) cream Apply 1 Application topically as needed. 30 g 0   metoprolol tartrate 75 MG TABS Take 75 mg by mouth 2 (two) times daily. 90 tablet 3   oxyCODONE-acetaminophen (PERCOCET) 7.5-325 MG tablet Take 1 tablet by mouth every 8 (eight) hours as needed for severe pain. 30 tablet 0   potassium chloride 20 MEQ/15ML (10%) SOLN Take 45 mLs (60 mEq total) by mouth daily. 473 mL 3   POTASSIUM PO Take by mouth.     No current facility-administered medications for this visit.   Facility-Administered Medications Ordered in Other Visits  Medication Dose Route Frequency Provider Last Rate Last Admin   fentaNYL (SUBLIMAZE) injection   Intravenous PRN Mir, Paula Libra, MD   50 mcg at 10/02/21 1436   midazolam (VERSED) injection   Intravenous PRN Mir, Paula Libra, MD   1 mg at 10/02/21 1436   pembrolizumab (KEYTRUDA) 200 mg in sodium chloride 0.9 % 50 mL chemo infusion  200 mg Intravenous Once [name], MD 116 mL/hr at 03/16/22 1150 200 mg at 03/16/22 1150    VITAL SIGNS: There were no vitals taken for this visit. There were no vitals filed for this visit.    Estimated body mass index is 35.2 kg/m as calculated from the following:   Height as of 02/22/22: '5\' 3"'$  (1.6 m).   Weight as of an earlier encounter on 03/16/22: 198 lb 11.2 oz (90.1 kg).   PERFORMANCE STATUS (ECOG) : 1 - Symptomatic but completely ambulatory   Physical  Exam General: NAD Cardiovascular:RRR Respiratory: normal breathing pattern  Abdomen: soft, tender, + bowel sounds, colostomy in place Neurological: AAO x4, mood appropriate  IMPRESSION:  I saw Mrs. Lanter during her infusion today. Elnora is continues to do well. Occasional fatigue. Appetite is good. She shares her emotional distress of the passing of her mother on last week, caring for her dad, in addition to herself and family. Emotional support provided. She is not  sleeping much. She is allowing herself the ability to grieve in a healthy way overall. Some anxiety due to lifestyle changes etc.    Neoplams related pain Pain is much improved. Does not require daily medications however does have pain on occasions. She is tolerating Cymbalta '60mg'$ . Ocycodone on hand as needed for severe breakthrough pain. Not taking regularly.   Will continue to closely monitor.   3. Constipation No concerns with constipation. Encouraged daily bowel regimen to prevent constipation.    PLAN: Oxycodone/APAP 7.'5mg'$ /'325mg'$  every 8 hours as needed for pain.  Cymbalta 60 mg twice daily Miralax daily for constipation Xanax 0.'25mg'$  as needed at bedtime for sleep/anxiety. I will plan to see her back in 4-6 weeks in collaboration with future appointments.   Patient expressed understanding and was in agreement with this plan. She also understands that She can call the clinic at any time with any questions, concerns, or complaints.      Any controlled substances utilized were prescribed in the context of palliative care. PDMP has been reviewed.    Time Total: 20 min   Visit consisted of counseling and education dealing with the complex and emotionally intense issues of symptom management and palliative care in the setting of serious and potentially life-threatening illness.Greater than 50%  of this time was spent counseling and coordinating care related to the above assessment and plan.  Alda Lea, AGPCNP-BC  Palliative Medicine Team/[place]

## 2022-03-17 ENCOUNTER — Other Ambulatory Visit: Payer: Self-pay

## 2022-03-17 ENCOUNTER — Other Ambulatory Visit: Payer: Self-pay | Admitting: *Deleted

## 2022-03-17 ENCOUNTER — Telehealth: Payer: Self-pay | Admitting: *Deleted

## 2022-03-17 ENCOUNTER — Other Ambulatory Visit: Payer: Self-pay | Admitting: Nurse Practitioner

## 2022-03-17 DIAGNOSIS — C786 Secondary malignant neoplasm of retroperitoneum and peritoneum: Secondary | ICD-10-CM

## 2022-03-17 DIAGNOSIS — C187 Malignant neoplasm of sigmoid colon: Secondary | ICD-10-CM

## 2022-03-17 DIAGNOSIS — N838 Other noninflammatory disorders of ovary, fallopian tube and broad ligament: Secondary | ICD-10-CM

## 2022-03-17 LAB — T4: T4, Total: 6.2 ug/dL (ref 4.5–12.0)

## 2022-03-17 NOTE — Telephone Encounter (Signed)
Spoke with the patient regarding the referral to GYN oncology. Patient scheduled as new patient with Dr Ernestina Patches on 10/11 at 9:45 am. Patient given an arrival time of 9:15 am.  Explained to the patient the the doctor will perform a pelvic exam at this visit. Patient given the policy that no visitors under the 16 yrs are allowed in the [facility]. Patient given the address/phone number for the clinic and that the center offers free valet service.

## 2022-03-18 ENCOUNTER — Ambulatory Visit (HOSPITAL_COMMUNITY)
Admission: RE | Admit: 2022-03-18 | Discharge: 2022-03-18 | Disposition: A | Discharge status: Home / Self Care | Payer: BC Managed Care – PPO | Source: Ambulatory Visit | Attending: Nurse Practitioner | Admitting: Nurse Practitioner

## 2022-03-18 DIAGNOSIS — C786 Secondary malignant neoplasm of retroperitoneum and peritoneum: Secondary | ICD-10-CM | POA: Diagnosis not present

## 2022-03-18 DIAGNOSIS — C187 Malignant neoplasm of sigmoid colon: Secondary | ICD-10-CM | POA: Diagnosis not present

## 2022-03-18 DIAGNOSIS — D251 Intramural leiomyoma of uterus: Secondary | ICD-10-CM | POA: Diagnosis not present

## 2022-03-18 DIAGNOSIS — N83201 Unspecified ovarian cyst, right side: Secondary | ICD-10-CM | POA: Diagnosis not present

## 2022-03-18 DIAGNOSIS — C189 Malignant neoplasm of colon, unspecified: Secondary | ICD-10-CM | POA: Diagnosis not present

## 2022-03-19 ENCOUNTER — Inpatient Hospital Stay: Payer: BC Managed Care – PPO | Admitting: Licensed Clinical Social Worker

## 2022-03-19 DIAGNOSIS — C187 Malignant neoplasm of sigmoid colon: Secondary | ICD-10-CM

## 2022-03-19 LAB — CA 125: Cancer Antigen (CA) 125: 15 U/mL (ref 0.0–38.1)

## 2022-03-19 NOTE — Progress Notes (Signed)
[facility] CSW Progress Note  Holiday representative  spoke with pt regarding financial concerns.    Pt shared with CSW that her mother passed away last week and the family is short approximately $700 to pay for her cremation.  CSW explored hospital and community resources for funds to assist with this expense.  CSW spoke with a representative at [facility] who states they would not be able to specifically help with that expense, but they may be able to assist with other expenses to offset the cost and requested a referral be sent to them through their website.  CSW informed pt of the above and she verbalized agreement w/ referral being sent.  CSW sent referral on behalf of pt and also provided pt w/ information about the Giving Tree this year and will follow up with pt in early November to assist w/ providing pt w/ Christmas gifts.      Henriette Combs, LCSW

## 2022-03-23 ENCOUNTER — Encounter: Payer: Self-pay | Admitting: Nurse Practitioner

## 2022-03-25 ENCOUNTER — Encounter: Payer: Self-pay | Admitting: Psychiatry

## 2022-03-28 NOTE — Progress Notes (Unsigned)
GYNECOLOGIC ONCOLOGY NEW PATIENT CONSULTATION  Date of Service: 03/29/2022 Referring Provider: [name], MD   ASSESSMENT AND PLAN: Mikayla Rios is a 36 y.o. woman with stage II sigmoid colon cancer, with bilateral adnexal masses.  Reviewed patient's course which included concern for progression of disease following surgery, prior to initiation of adjuvant treatment.  On my review of her scans, there were no adnexal masses noted prior to her surgery.  However these started to appear around the same time of her evidence of progressive disease in the psoas, abdominal wall, and peritoneum.  One of the peritoneal masses was biopsied and confirmed to be evidence of metastatic colon cancer.  She demonstrated progression of disease while on FOLFOX, and the adnexal masses grew during this time as well.  She was then switched to pembrolizumab with improvement on imaging and her peritoneal disease and normalization of her CEA.  The adnexal masses also appeared to initially respond as well.  However on most recent imaging her adnexal masses appear stable to mildly increased.  Of note, some of the pelvic ascites has appeared to resolve and this may affect measurements as the masses are more clearly delineated at this time.  A CA125 was drawn and was normal at 15.  Her genetic testing was negative for Lynch syndrome and BRCA1/2.  We reviewed that one of the possible etiologies of the bilateral adnexal masses is metastatic colon cancer. Other possibilities include tubo-ovarian complexes secondary to patient's extensive prior infection history and multiple drain placements. Discussed that a primary ovarian process is also possible, although I suspect this is less likely.   We discussed recommendation for additional imaging to possibly help discern the above etiologies. Recommend PET to evaluate if the adnexal masses demonstrate active disease. Also recommend pelvic MRI to better characterize the pelvic masses. If  based on imaging, concern for active disease, discussed that we would possibly consider image-directed biopsy over initial surgical intervention given pt's surgical history and intraperitoneal involvement of her colon cancer as we would want to minimize possible complication and interruption to her active management. Following imaging, we will likely discuss her case at our tumor board.  Patient in agreement with plan.  All questions answered.  A copy of this note was sent to the patient's referring provider.  Bernadene Bell, MD Gynecologic Oncology   Medical Decision Making I personally spent  TOTAL 58 minutes face-to-face and non-face-to-face in the care of this patient, which includes all pre, intra, and post visit time on the date of service.  10 minutes spent reviewing records prior to the visit 40 Minutes in patient contact 8 minutes charting , conferring with consultants etc.   ------------  CC: Bilateral adnexal masses in the setting of colon cancer  HISTORY OF PRESENT ILLNESS:  Mikayla Rios is a 36 y.o. woman who is seen in consultation at the request of [name], MD for evaluation of bilateral adnexal masses in the setting of colon cancer.  Pt was initially treated for diverticulitis with perforation and abscess, requiring drains, in May of 2022. She was subsequently admitted and take to the OR in November 2022 for emergent small bowel and left colon resection which ultimately revealed a stage II moderately differentiated colonic adenocarcinoma. Prior to initiation of adjuvant treatment, repeat imaging in January of 2023 showed progression with metastatic disease involving soft tissue masses in the left psoas muscle, left lateral abdominal wall and peritoneal mass, mild retroperitoneal lymphadenopathy.   At this time as well, she  was noted on imaging to have a cystic and solid right adnexal mass measuring 7.6 x 6.4cm (enlarged from 4.2 x 3.4 on prior imaging) and an abnormal  tissue density in the inferior right adnexa measuring 6.0 x 4.4cm (prior 5.7 x 3.3cm).   CEA at this time was elevated to 87.41. She was started on FOLFOX but did not respond. She was switched to [facility] 09/08/21 and has been tolerating this well. Her CEA has normalized as of 12/22/21. On imaging in August, she showed partial treatment response with improvement to the left psoas muscle mass, omental and ovarian deposits but slight increase in omental caking in the LLQ.   On repeat imaging on 03/16/22, she was noted to have similar appearance of peritoneal thickening and nodularity throughout, slightly decrease in size of the retroperitoneal and abdominal wall masses and slight increase in size of the ovarian masses, with the overall impression as a mixed response to treatment with overall little interval change. The adnexal lesions were noted to be measuring as 7.5 x 6.1cm on the left (prior 6.6 x 6.1cm) and 6.2 x 3.4cm on the right (prior 5.5 x 3.2cm).   Given this slight change, her medical oncology team had the patient undergo a pelvic ultrasound and CA125. The CA125 returned normal at 15.0. Her pelvic ultrasound demonstrated a simple right cyst measuring 4.5 x 4.1 x 5.4cm and a "midly complicated" left cystic lesion measuring 8.4 x 5.0 x 6.2cm containing a partial septation.   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   Today patient presents with her partner.  She reports a history of irregular periods and fibroids.  She reports that the irregular periods have existed all of her life.  In her fibroid she has known about at least since her pregnancies as they were identified on obstetrical ultrasounds.  She denies a history of ovarian cyst.  She reports that she is overall tolerating treatment well but does note some fatigue and joint pains.  She reports that she is not currently experiencing  pelvic pain.  She is eating and drinking well and has good ostomy output.   TREATMENT HISTORY: Oncology History Overview Note   Cancer Staging  Malignant neoplasm of sigmoid colon [facility]) Staging form: Colon and Rectum, AJCC 8th Edition - Pathologic stage from 04/15/2021: Stage IIC (pT4b, pN0, cM0) - Signed by [name], MD on 06/12/2021    Malignant neoplasm of sigmoid colon [facility])   Initial Diagnosis   Malignant neoplasm of sigmoid colon ([place])   11/04/2020 Imaging   EXAM: CT ABDOMEN AND PELVIS WITHOUT CONTRAST  IMPRESSION: 1. Perforating descending colonic diverticulitis with multiple abdominopelvic gas and fluid collections, measuring up to 5.7 cm and further described above. 2. Cholelithiasis without findings of acute cholecystitis. 3. 3.6 cm benign left adrenal adenoma. 4. Leiomyomatous uterus. 5. 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   11/07/2020 Imaging   EXAM: CT ABDOMEN AND PELVIS WITHOUT CONTRAST  IMPRESSION: Continued wall thickening is seen involving descending colon suggesting infectious or inflammatory colitis or perforated diverticulitis. There is an adjacent fluid collection measuring 7.0 x 5.0 cm consistent with abscess which is significantly enlarged compared to prior exam. Adjacent to the abscess, there is a severely thickened small bowel loop most consistent with secondary inflammation.   5.2 x 3.5 cm fluid collection is  noted within the left psoas muscle consistent with abscess which is significantly enlarged compared to prior exam. 4.4 x 3.8 cm fluid collection consistent with abscess is noted in the left retroperitoneal region which is also enlarged compared to prior exam.   Fibroid uterus.   Cholelithiasis.   11/27/2020 Imaging   EXAM: CT ABDOMEN AND PELVIS WITHOUT CONTRAST  IMPRESSION: 1. Significantly interval decreased size of the  previously visualized left retroperitoneal and left anterior mid abdominal fluid collections. There is persistent fat stranding and mural thickening of the mid descending colon at this level. 2. Unchanged leiomyomatous uterus. 3. Unchanged cholelithiasis.   12/11/2020 Imaging   EXAM: CT ABDOMEN AND PELVIS WITHOUT CONTRAST  IMPRESSION: 1. Stable inflammatory changes centered around the distal descending colon in the left lower abdomen. Pericolonic inflammatory changes have minimally changed since 11/27/2020. Small pocket of gas medial to the colon is probably associated with a fistula or small residual abscess collection. 2. Stable position of the two percutaneous drains. The more anterior drain may be extending through a portion of the small bowel. 3. Cholelithiasis. 4. Fibroid uterus. Cannot exclude an ovarian/adnexal cystic structure near the uterine fundus. 5. Left adrenal adenoma.   01/07/2021 Imaging   EXAM: CT ABDOMEN AND PELVIS WITH CONTRAST  IMPRESSION: 1. No new abscesses identified. Similar degree of soft tissue thickening seen in the left pericolonic region. The degree of persistent soft tissues thickening in the pericolonic space further raises suspicions for malignancy. Further evaluation with colonoscopy should be performed. 2. Left retroperitoneal abscess drain unchanged in position. Anterior left abdominal drain again seen terminating within small bowel loop. 3. 2.6 cm mildly sclerotic lesion noted in the L2 vertebral body. Further evaluation with contrast enhanced lumbar spine MRI should be performed.   02/04/2021 Imaging   EXAM: CT ABDOMEN AND PELVIS WITH CONTRAST  IMPRESSION: No new abdominopelvic collections or abscess development in the 1 month interval.   Left anterior drain remains within a loop of small bowel, unchanged.   Left lateral abscess drain remains in the retroperitoneal space adjacent to the iliopsoas muscle with a small amount of  residual fluid and air but no measurable collection.   Stable soft tissue prominence and pericolonic strandy edema/inflammation about the left descending colon compatible with residual diverticulitis/colitis. Difficult to exclude underlying transmural lesion.   04/01/2021 Imaging   EXAM: CT ABDOMEN AND PELVIS WITH CONTRAST  IMPRESSION: There appears to be significant enhancement and wall thickening involving the descending colon with some degree of traction and involvement of adjacent small bowel loops. This is consistent with the history of diverticulitis and perforation. Stable position of percutaneous drainage catheter is seen adjacent to left psoas muscle with no significant residual fluid remaining. The other percutaneous drainage catheter that was previously noted to be within small bowel loop on prior exam in the left lower quadrant, has significantly retracted and appears to be outside of the peritoneal space at this time.   Since the prior exam, there does appear to be some degree of rotation involving mesenteric vessels and structures in the right lower quadrant, suggesting partial volvulus or malrotation. There is the interval development of several lymph nodes in this area, most likely inflammatory or reactive in etiology. Mild amount of free fluid is also noted in the pelvis. However, no significant bowel wall thickening or dilatation is seen in this area. These results will be called to the ordering clinician or representative by the Radiologist Assistant, and communication documented in the PACS or zVision Dashboard.  Stable uterine fibroid.   Hepatic steatosis.   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   Cholelithiasis.   04/10/2021 Imaging   EXAM: CT ANGIOGRAPHY CHEST CT ABDOMEN AND PELVIS WITH CONTRAST  IMPRESSION: 1. Again seen are findings compatible with descending colon diverticulitis with perforation. Free air and inflammation have increased in the  interval. 2. New lobulated enhancing fluid collection posterior to this segment of inflamed colon measuring 8.5 x 3.5 x 10.0 cm. This collection now invades the adjacent iliopsoas muscle as well as extends through the left lateral abdominal wall. The tip of the drainage catheter is in this collection. Findings are compatible with abscess. 3. Anterior left percutaneous drainage catheter tip has been pulled back and is now within the subcutaneous tissues. 4. Trace free fluid. 5. No pulmonary embolism.  No acute cardiopulmonary process. 6. Cholelithiasis. 7. Fatty infiltration of the liver.   04/15/2021 Definitive Surgery   FINAL MICROSCOPIC DIAGNOSIS:   A. SMALL BOWEL, RESECTION:  - Adenocarcinoma.  - No carcinoma identified in 1 lymph node.   B. PERFORATED LEFT COLON, RESECTION:  - Moderately differentiated colonic adenocarcinoma.  - Tumor extends into pericolonic adipose tissue, and is strongly  suggestive of invasion into small bowel.  See oncology table/comments.  - No carcinoma identified in 4 lymph nodes (0/4).  - Tubular adenoma with high-grade dysplasia, 1.  - Tubular adenomas with low grade dysplasia, 3.   Comments: The size of the tumor is difficult to estimate secondary to the disrupted nature of the specimen, as well as the infiltrative nature of the tumor.  Tumor can be identified as definitely invading into the pericolonic adipose tissue (block B4), but given the extreme disruption of the tissue, and the involvement of the small bowel by what appears to be a colonic adenocarcinoma, I favor perforation of the large bowel with direct invasion into the small bowel.  Accordingly, I believe this is best regarded as a pT4b lesion.   ADDENDUM:  Mismatch Repair Protein (IHC)  SUMMARY INTERPRETATION: ABNORMAL  There is loss of the major MMR protein [name]6: This indicates a high probability that a hereditary germline mutation is present and referral to genetic counseling is  warranted. It is recommended that the loss of protein expression be correlated with molecular based microsatellite instability testing.   IHC EXPRESSION RESULTS  TEST           RESULT  MLH1:          Preserved nuclear expression  [name]2:          Preserved nuclear expression  [name]6:          LOSS OF NUCLEAR EXPRESSION  PMS2:          Preserved nuclear expression    04/15/2021 Cancer Staging   Staging form: Colon and Rectum, AJCC 8th Edition - Pathologic stage from 04/15/2021: Stage IIC (pT4b, pN0, cM0) - Signed by [name], MD on 06/12/2021 Stage prefix: Initial diagnosis Total positive nodes: 0 Histologic grading system: 4 grade system Histologic grade (G): G2 Residual tumor (R): R0 - None   04/29/2021 Imaging   EXAM: CT ABDOMEN AND PELVIS WITH CONTRAST  IMPRESSION: Continued left retroperitoneal abscesses inferior to the left kidney, involving the left psoas muscle and left abdominal wall musculature. Overall size is decreased since prior study. Interval removal of left lower quadrant abscess drainage catheter.   New fluid collection in the cul-de-sac and wrapping around the uterus concerning for abscess.   Cholelithiasis.   Small left pleural effusion.  Bibasilar atelectasis.   Stable left adrenal adenoma.  ADDENDUM: After discussing the case with Dr. Dwaine Gale in interventional radiology, it was noted that the fluid in the pelvis originally thought to be in the cul-de-sac is likely within the vagina, best seen on sagittal imaging. Recommend speculum exam.   Also, in the left lateral wall in the area of prior abscess in the lateral wall abdominal musculature, some of the abdomen soft tissue appears to be enhancing. While this could be infectious, cannot exclude tumor seeding in the left lateral abdominal wall, measuring 6.9 x 3.8 cm on image 64 of series 2.   06/16/2021 Imaging   EXAM: CT ABDOMEN AND PELVIS WITH CONTRAST  IMPRESSION: Increased size of bulky soft tissue masses  involving the left psoas muscle and left lateral abdominal wall soft tissues, consistent with progressive metastatic disease.   Significant progression of multiple peritoneal masses throughout the abdomen and pelvis, consistent with peritoneal carcinomatosis.   New mild retroperitoneal lymphadenopathy, consistent with metastatic disease.   Stable uterine fibroid and left adrenal adenoma.   Cholelithiasis, without evidence of cholecystitis.   06/22/2021 - 08/19/2021 Chemotherapy   Patient is on Treatment Plan : COLORECTAL FOLFOX q14d x 6 months     06/22/2021 Tumor Marker   Patient's tumor was tested for the following markers: CEA. Results of the tumor marker test revealed 87.41.   07/10/2021 Pathology Results   FINAL MICROSCOPIC DIAGNOSIS:   A. PERITONEAL MASS, LEFT UPPER ABDOMINAL QUADRANT, BIOPSY:  -  Metastatic adenocarcinoma with necrosis, histologically consistent with colon primary.     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 The report date is 07/16/2021. The remainder of testing was negative/normal.  The CancerNext-Expanded + RNAinsight gene panel offered by [employer] and includes sequencing and rearrangement analysis for the following 77 genes: IP, ALK, APC*, ATM*, AXIN2, BAP1, BARD1, BLM, BMPR1A, BRCA1*, BRCA2*, BRIP1*, CDC73, CDH1*,CDK4, CDKN1B, CDKN2A, CHEK2*, CTNNA1, DICER1, FANCC, FH, FLCN, GALNT12, KIF1B, LZTR1, MAX, MEN1, MET, MLH1*, [name]2*, [name]3, [name]6*, MUTYH*, NBN, NF1*, NF2, NTHL1, PALB2*, PHOX2B, PMS2*, POT1, PRKAR1A, PTCH1, PTEN*, RAD51C*, RAD51D*,RB1, RECQL, RET, SDHA, SDHAF2, SDHB, SDHC, SDHD, SMAD4, SMARCA4, SMARCB1, SMARCE1, STK11, SUFU, TMEM127, TP53*,TSC1, TSC2, VHL and XRCC2 (sequencing and deletion/duplication); EGFR, EGLN1, HOXB13, KIT, MITF, PDGFRA, POLD1 and POLE (sequencing only); EPCAM and GREM1 (deletion/duplication only).   08/28/2021 Imaging   EXAM: CT CHEST, ABDOMEN, AND  PELVIS WITH CONTRAST  IMPRESSION: 1. Continued interval progression of multiple metastatic soft tissue nodules/masses in the abdomen and pelvis. 2. No evidence for metastatic disease in the chest. 3. Similar appearance of the subtle lesion in the L2 vertebral body, suspicious for metastatic involvement. 4. Cholelithiasis.   09/03/2021 -  Chemotherapy   Patient is on Treatment Plan : COLORECTAL Pembrolizumab (200) q21d     09/08/2021 - 02/01/2022 Chemotherapy   Patient is on Treatment Plan : COLORECTAL Pembrolizumab (200) q21d     01/08/2022 Imaging   CT CAP IMPRESSION: 1. Mixed response to therapy. Interval decrease in size of the conglomerate multilobulated mass extending from the left psoas into the left posterior pararenal space through the left lateral abdominal wall along the tract of the previously placed percutaneous drainage catheter. Interval decrease in size of right adnexal mass and omental masses within the right lower quadrant of the abdomen. Interval development of peritoneal carcinomatosis with omental caking within the left lower quadrant of the abdomen and left subdiaphragmatic region adjacent to the spleen and along the left pericolic  gutter as well as development of ascites appearing loculated within the anterior peritoneum. 2. Interval development of mild left hydronephrosis secondary to stricturing of the mid left ureter. 3. Stable left adrenal metastasis. 4. Stable sclerotic lesion within the L2 vertebral body. No new lytic or blastic bone lesions are seen. 5. Cholelithiasis. 6. No evidence of intrathoracic metastatic disease. 7. 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 Moderate colonic stool burden. No evidence of obstruction.   03/15/2022 Imaging   IMPRESSION: 1. Status post sigmoid colon resection with left lower quadrant end colostomy. 2. Multicystic bilateral ovarian lesions are slightly  increased in size. 3. Diminished, small volume of loculated appearing ascites throughout the abdomen and pelvis, with similar appearance of peritoneal thickening and nodularity throughout. 4. Heterogeneously calcified, conglomerate masses in the left retroperitoneum and abdominal wall are slightly diminished in size. 5. Left adrenal lesion not significantly changed. 6. Unchanged faintly sclerotic metastatic lesion of L2. 7. Constellation of findings is consistent with mixed response to treatment however with overall little interval change. 8. Minimal residual left hydronephrosis and proximal hydroureter, improved compared to prior examination. 9. Cholelithiasis. 10. Coronary artery disease, significantly advanced for patient age.   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 [facility])     PAST MEDICAL HISTORY: Past Medical History:  Diagnosis Date   Allergy    seasonal   Anemia    Blood infection 1985   Blood transfusion without reported diagnosis    Diverticulitis    Family history of breast cancer    Family history of colon cancer    Family history of stomach cancer    Hypertension    Obesity    Sleep apnea     PAST SURGICAL HISTORY: Past Surgical History:  Procedure Laterality Date   COLECTOMY WITH COLOSTOMY CREATION/HARTMANN PROCEDURE N/A 04/15/2021   Procedure: COLOSTOMY CREATION/HARTMANN PROCEDURE;  Surgeon: [name], MD;  Location: [facility];  Service: General;  Laterality: N/A;   IR CATHETER TUBE CHANGE  12/12/2020   IR CATHETER TUBE CHANGE  01/27/2021   IR CATHETER TUBE CHANGE  02/19/2021   IR CATHETER TUBE CHANGE  04/13/2021   IR IMAGING GUIDED PORT INSERTION  10/02/2021   IR RADIOLOGIST EVAL & MGMT  11/27/2020   IR RADIOLOGIST EVAL & MGMT  12/11/2020   IR RADIOLOGIST EVAL & MGMT  01/07/2021   IR RADIOLOGIST  EVAL & MGMT  02/04/2021   IR SINUS/FIST TUBE CHK-NON GI  12/12/2020   IR SINUS/FIST TUBE CHK-NON GI  02/19/2021   LAPAROSCOPIC PARTIAL COLECTOMY N/A 04/15/2021   Procedure: LAPAROSCOPIC ASSISTED HARTMANN RESECTION;  Surgeon: [name], MD;  Location: [facility];  Service: General;  Laterality: N/A;    OB/GYN HISTORY: OB History  Gravida Para Term Preterm AB Living  3 3 3  0 0 3  SAB IAB Ectopic Multiple Live Births  0 0 0 0 3    # Outcome Date GA Lbr Len/2nd Weight Sex Delivery Anes PTL Lv  3 Term 12/16/12 [id number] / 00:14  M Vag-Spont EPI  LIV  2 Term 2009 [id number] lb 12 oz (3.515 kg) F Vag-Spont   LIV  1 Term 2004 4018w0d lb 3 oz (3.714 kg) M Vag-Spont   LIV     Birth Comments: 3rd degree vaginal  laceration      Age at menarche: 28 Age at menopause: N/A Hx of HRT: N/A Hx of STI: No Last pap: Unsure, possibly 5 years ago History of abnormal pap smears: Denies  SCREENING STUDIES:  Last mammogram: None Last colonoscopy: None  MEDICATIONS:  Current Outpatient Medications:    ALPRAZolam (XANAX) 0.25 MG tablet, Take 1 tablet (0.25 mg total) by mouth at bedtime as needed for anxiety., Disp: 30 tablet, Rfl: 0   amLODipine (NORVASC) 10 MG tablet, TAKE 1 TABLET BY MOUTH EVERY DAY, Disp: 90 tablet, Rfl: 3   DULoxetine (CYMBALTA) 60 MG capsule, TAKE 1 CAPSULE BY MOUTH EVERY DAY, Disp: 90 capsule, Rfl: 2   lidocaine-prilocaine (EMLA) cream, Apply 1 Application topically as needed., Disp: 30 g, Rfl: 0   metoprolol tartrate 75 MG TABS, Take 75 mg by mouth 2 (two) times daily., Disp: 90 tablet, Rfl: 3   oxyCODONE-acetaminophen (PERCOCET) 7.5-325 MG tablet, Take 1 tablet by mouth every 8 (eight) hours as needed for severe pain., Disp: 30 tablet, Rfl: 0   POTASSIUM PO, Take by mouth., Disp: , Rfl:  No current facility-administered medications for this visit.  Facility-Administered Medications Ordered in Other Visits:    fentaNYL (SUBLIMAZE) injection, , Intravenous, PRN, Mir, Paula Libra,  MD, 50 mcg at 10/02/21 1436   midazolam (VERSED) injection, , Intravenous, PRN, Mir, Paula Libra, MD, 1 mg at 10/02/21 1436  ALLERGIES: Allergies  Allergen Reactions   Chlorhexidine Gluconate Hives and Itching    CHG wipes cause itching and hives requiring benadryl   Shellfish Allergy Hives   Penicillins Hives and Rash    Has patient had a PCN reaction causing immediate rash, facial/tongue/throat swelling, SOB or lightheadedness with hypotension: No Has patient had a PCN reaction causing severe rash involving mucus membranes or skin necrosis: hives Has patient had a PCN reaction that required hospitalization No Has patient had a PCN reaction occurring within the last 10 years: yes If all of the above answers are "NO", then may proceed with Cephalosporin use.     FAMILY HISTORY: Family History  Problem Relation Age of Onset   Diabetes Mother    Hypertension Mother    Colon cancer Mother        dx at age 70   Breast cancer Mother    Congestive Heart Failure Father    Hypertension Father    Diabetes Maternal Grandmother    Hypertension Maternal Grandmother    Diabetes Maternal Grandfather    Kidney disease Maternal Grandfather    Diabetes Paternal Grandmother    Autism Son    Stomach cancer Maternal Uncle    Heart disease Paternal Aunt    Colon polyps Half-Sister        colectomy   Ovarian cancer Neg Hx    Endometrial cancer Neg Hx    Pancreatic cancer Neg Hx    Prostate cancer Neg Hx     SOCIAL HISTORY: Social History   Socioeconomic History   Marital status: Married    Spouse name: Errol   Number of children: 3   Years of education: Not on file   Highest education level: Not on file  Occupational History   Not on file  Tobacco Use   Smoking status: Never   Smokeless tobacco: Never  Vaping Use   Vaping Use: Never used  Substance and Sexual Activity   Alcohol use: Not Currently    Comment: Occ   Drug use: No   Sexual activity: Yes  Partners: Male     Birth control/protection: None  Other Topics Concern   Not on file  Social History Narrative   Not on file   Social Determinants of Health   Financial Resource Strain: High Risk (05/26/2021)   Overall Financial Resource Strain (CARDIA)    Difficulty of Paying Living Expenses: Hard  Food Insecurity: Food Insecurity Present (05/26/2021)   Hunger Vital Sign    Worried About Running Out of Food in the Last Year: Sometimes true    Ran Out of Food in the Last Year: Sometimes true  Transportation Needs: No Transportation Needs (05/26/2021)   PRAPARE - Hydrologist (Medical): No    Lack of Transportation (Non-Medical): No  Physical Activity: Not on file  Stress: Stress Concern Present (05/26/2021)   [place]    Feeling of Stress : Very much  Social Connections: Moderately Integrated (05/26/2021)   Social Connection and Isolation Panel [NHANES]    Frequency of Communication with Friends and Family: More than three times a week    Frequency of Social Gatherings with Friends and Family: More than three times a week    Attends Religious Services: More than 4 times per year    Active Member of [employer] or Organizations: No    Attends Archivist Meetings: Never    Marital Status: Married  Human resources officer Violence: Not on file    REVIEW OF SYSTEMS: New patient intake form was reviewed.  Complete 10-system review is negative except for the following: Joint pain, anxiety, depression, itching  PHYSICAL EXAM: BP 134/83 (BP Location: Right Arm, Patient Position: Sitting)   Pulse 84   Temp 98.6 F (37 C) (Oral)   Resp 16   Ht 5' 3"  (1.6 m)   Wt 202 lb (91.6 kg)   SpO2 100%   BMI 35.78 kg/m  Constitutional: No acute distress. Neuro/Psych: Alert, oriented.  Head and Neck: Normocephalic, atraumatic. Neck symmetric without masses. Sclera anicteric.  Respiratory: Normal work of breathing.  Clear to auscultation bilaterally. Cardiovascular: Regular rate and rhythm, no murmurs, rubs, or gallops. Abdomen: Normoactive bowel sounds. Soft, non-distended, non-tender to palpation.  Firmness in lower abdomen.  Firmness underlying left healed drain incision.  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  Well-healed laparoscopic incisions. Extremities: Grossly normal range of motion. Warm, well perfused. No edema bilaterally. Skin: No rashes or lesions. Lymphatic: No cervical, supraclavicular, or inguinal adenopathy. Genitourinary: External genitalia without lesions. Urethral meatus without lesions or prolapse. On speculum exam, vagina and cervix without lesions.  Pap smear collected.  Bimanual exam reveals normal cervix.  Firm pelvic masses unable to discretely palpate separate from the uterus, which appear to be predominantly anterior to the uterus, with minimal mobility. Rectovaginal exam confirms the above findings and reveals normal sphincter tone and no masses or nodularity. Exam chaperoned by Joylene John, NP  LABORATORY AND RADIOLOGIC DATA: Outside medical records were reviewed to synthesize the above history, along with the history and physical obtained during the visit.  Outside laboratory, pathology, and imaging reports were reviewed, with pertinent results below.  I personally reviewed the outside images.  WBC  Date Value Ref Range Status  10/10/2021 6.0 4.0 - 10.5 K/uL Final   WBC Count  Date Value Ref Range Status  03/16/2022 4.6 4.0 - 10.5 K/uL Final   Hemoglobin  Date Value Ref Range Status  03/16/2022 9.3 (L) 12.0 - 15.0 g/dL Final  10/02/2019 11.1 11.1 -  15.9 g/dL Final   HCT  Date Value Ref Range Status  03/16/2022 29.1 (L) 36.0 - 46.0 % Final   Hematocrit  Date Value Ref Range Status  10/02/2019 35.8 34.0 - 46.6 % Final   Platelets  Date Value Ref Range Status  10/02/2019 557 (H) 150 - 450 x10E3/uL Final   Platelet Count  Date Value Ref Range Status  03/16/2022  439 (H) 150 - 400 K/uL Final   Magnesium  Date Value Ref Range Status  10/20/2021 1.2 (L) 1.7 - 2.4 mg/dL Final    Comment:    Performed at [facility], [place] [address], [place], [place] [address]   Creatinine  Date Value Ref Range Status  03/16/2022 0.67 0.44 - 1.00 mg/dL Final   AST  Date Value Ref Range Status  03/16/2022 12 (L) 15 - 41 U/L Final   ALT  Date Value Ref Range Status  03/16/2022 12 0 - 44 U/L Final    US PELVIC COMPLETE WITH TRANSVAGINAL 03/18/2022  Narrative CLINICAL DATA:  Metastatic colon cancer with multi-cystic ovarian lesions question metastases  EXAM: TRANSABDOMINAL AND TRANSVAGINAL ULTRASOUND OF PELVIS  TECHNIQUE: Both transabdominal and transvaginal ultrasound examinations of the pelvis were performed. Transabdominal technique was performed for global imaging of the pelvis including uterus, ovaries, adnexal regions, and pelvic cul-de-sac. It was necessary to proceed with endovaginal exam following the transabdominal exam to visualize the endometrium and ovaries.  COMPARISON:  CT chest abdomen pelvis 03/15/2022  FINDINGS: Uterus  Measurements: 13.6 x 9.8 x 10.9 cm = volume: 744 ML. Anteverted. Partially calcified anterior wall intramural leiomyoma 5.2 x 6.0 x 5.4 cm. Additional small anterior wall intramural leiomyoma more inferiorly anterior uterus to LEFT 2.3 x 2.0 x 2.2 cm  Endometrium  Thickness: 6 mm.  No endometrial fluid or mass  Right ovary  Measurements: 6.7 x 4.6 x 6.2 cm = volume: 98 mL. Simple cyst RIGHT ovary 4.5 x 4.1 x 5.4 cm; Recommend follow-up US in 3-6 months. Note: This recommendation does not apply to premenarchal patients or to those with increased risk (genetic, family history, elevated tumor markers or other high-risk factors) of ovarian cancer. Reference: Radiology 2019 Nov; 293(2):359-371.  Left ovary  Measurements: 7.7 x 5.6 x 8.0 cm = volume: 181 mL. Seen only  on transabdominal imaging due to high position. Mildly complicated cystic lesion identified 8.4 x 5.0 x 6.2 cm containing a partial septation.  Other findings  No free pelvic fluid or additional adnexal masses.  IMPRESSION: Non incidental simple cyst RIGHT ovary 5.4 cm diameter; follow-up imaging recommended in 3-6 months as above.  Non incidental cystic lesion LEFT ovary 8.4 cm greatest size complicated by a single partial septation; based on lesion size and premenopausal status of patient, this represents a mildly complicated cyst and further evaluation by MR imaging with and without contrast is recommended to characterize.  This recommendation follows the consensus statement: Management of Asymptomatic Ovarian and Other Adnexal Cysts Imaged at [place]: Society of Radiologists in [place]. Radiology 2010; [phone].   Electronically Signed By: Lavonia Dana M.D. On: 03/18/2022 14:10   CT CHEST ABDOMEN PELVIS W CONTRAST 03/15/2022  Narrative CLINICAL DATA:  Metastatic colon cancer restaging, assess treatment response * Tracking Code: BO *  EXAM: CT CHEST, ABDOMEN, AND PELVIS WITH CONTRAST  TECHNIQUE: Multidetector CT imaging of the chest, abdomen and pelvis was performed following the standard protocol during bolus administration of intravenous contrast.  RADIATION DOSE REDUCTION: This exam was  performed according to the departmental dose-optimization program which includes automated exposure control, adjustment of the mA and/or kV according to patient size and/or use of iterative reconstruction technique.  CONTRAST:  14m OMNIPAQUE IOHEXOL 300 MG/ML  SOLN  COMPARISON:  01/08/2022  FINDINGS: CT CHEST FINDINGS  Cardiovascular: Right chest port catheter. Normal heart size. Left coronary artery calcifications. No pericardial effusion.  Mediastinum/Nodes: No enlarged mediastinal, hilar, or axillary lymph nodes. PICC remnant in the  anterior mediastinum. Thyroid gland, trachea, and esophagus demonstrate no significant findings.  Lungs/Pleura: Minimal, bandlike scarring of the lung bases. No pleural effusion or pneumothorax.  Musculoskeletal: No chest wall abnormality. No acute osseous findings.  CT ABDOMEN PELVIS FINDINGS  Hepatobiliary: No solid liver abnormality is seen. Gallstones. No gallbladder wall thickening, or biliary dilatation.  Pancreas: Unremarkable. No pancreatic ductal dilatation or surrounding inflammatory changes.  Spleen: Normal in size without significant abnormality.  Adrenals/Urinary Tract: Left adrenal lesion is not significantly changed, measuring 3.5 x 2.8 cm (series 2, image 55). Normal right adrenal. Minimal residual left hydronephrosis and proximal hydroureter, improved compared to prior examination (series 2, image 67). The right kidney is normal, without renal calculi, solid lesion, or hydronephrosis. Bladder is unremarkable.  Stomach/Bowel: Stomach is within normal limits. Appendix appears normal. Status post sigmoid colon resection with left lower quadrant end colostomy. Additional small bowel resection and reanastomosis in the right hemiabdomen.  Vascular/Lymphatic: No significant vascular findings are present. No enlarged abdominal or pelvic lymph nodes.  Reproductive: Uterine fibroids. Multicystic bilateral ovarian lesions are slightly increased in size, measuring 7.5 x 6.1 cm on the left, previously 6.6 x 6.1 cm (series 2, image 91) and 6.2 x 3.4 cm on the right, previously 5.5 x 3.2 cm (series 2, image 90).  Other: No abdominal wall hernia or abnormality. Diminished, small volume of loculated appearing ascites throughout the abdomen and pelvis, with similar appearance of peritoneal thickening and nodularity, particularly evident in the left hemiabdomen (series 2, image 71). Heterogeneously calcified, conglomerate masses in the left retroperitoneum and abdominal wall  are slightly diminished in size, largest mass subtending the left hemi abdominal wall measuring 9.2 x 2.5 cm, previously 10.0 x 3.0 cm when measured similarly (series 2, image 80), and lesion within the left psoas measuring 3.4 x 3.1 cm, previously 3.9 x 3.8 cm (series 2, image 85).  Musculoskeletal: No acute osseous findings. Unchanged faintly sclerotic lesion of L2 (series 6, image 130).  IMPRESSION: 1. Status post sigmoid colon resection with left lower quadrant end colostomy. 2. Multicystic bilateral ovarian lesions are slightly increased in size. 3. Diminished, small volume of loculated appearing ascites throughout the abdomen and pelvis, with similar appearance of peritoneal thickening and nodularity throughout. 4. Heterogeneously calcified, conglomerate masses in the left retroperitoneum and abdominal wall are slightly diminished in size. 5. Left adrenal lesion not significantly changed. 6. Unchanged faintly sclerotic metastatic lesion of L2. 7. Constellation of findings is consistent with mixed response to treatment however with overall little interval change. 8. Minimal residual left hydronephrosis and proximal hydroureter, improved compared to prior examination. 9. Cholelithiasis. 10. Coronary artery disease, significantly advanced for patient age.   Electronically Signed By: ADelanna AhmadiM.D. On: 03/16/2022 09:14

## 2022-03-29 ENCOUNTER — Other Ambulatory Visit: Payer: Self-pay

## 2022-03-29 ENCOUNTER — Other Ambulatory Visit (HOSPITAL_COMMUNITY)
Admission: RE | Admit: 2022-03-29 | Discharge: 2022-03-29 | Disposition: A | Discharge status: Home / Self Care | Payer: BC Managed Care – PPO | Source: Ambulatory Visit | Attending: Psychiatry | Admitting: Psychiatry

## 2022-03-29 ENCOUNTER — Encounter: Payer: Self-pay | Admitting: Psychiatry

## 2022-03-29 ENCOUNTER — Inpatient Hospital Stay (HOSPITAL_BASED_OUTPATIENT_CLINIC_OR_DEPARTMENT_OTHER): Payer: BC Managed Care – PPO | Admitting: Psychiatry

## 2022-03-29 VITALS — BP 134/83 | HR 84 | Temp 98.6°F | Resp 16 | Ht 63.0 in | Wt 202.0 lb

## 2022-03-29 DIAGNOSIS — I10 Essential (primary) hypertension: Secondary | ICD-10-CM | POA: Diagnosis not present

## 2022-03-29 DIAGNOSIS — K760 Fatty (change of) liver, not elsewhere classified: Secondary | ICD-10-CM | POA: Diagnosis not present

## 2022-03-29 DIAGNOSIS — Z124 Encounter for screening for malignant neoplasm of cervix: Secondary | ICD-10-CM | POA: Insufficient documentation

## 2022-03-29 DIAGNOSIS — C786 Secondary malignant neoplasm of retroperitoneum and peritoneum: Secondary | ICD-10-CM | POA: Diagnosis not present

## 2022-03-29 DIAGNOSIS — C7951 Secondary malignant neoplasm of bone: Secondary | ICD-10-CM | POA: Diagnosis not present

## 2022-03-29 DIAGNOSIS — K5900 Constipation, unspecified: Secondary | ICD-10-CM | POA: Diagnosis not present

## 2022-03-29 DIAGNOSIS — G893 Neoplasm related pain (acute) (chronic): Secondary | ICD-10-CM | POA: Diagnosis not present

## 2022-03-29 DIAGNOSIS — F32A Depression, unspecified: Secondary | ICD-10-CM | POA: Diagnosis not present

## 2022-03-29 DIAGNOSIS — C7972 Secondary malignant neoplasm of left adrenal gland: Secondary | ICD-10-CM | POA: Diagnosis not present

## 2022-03-29 DIAGNOSIS — Z5112 Encounter for antineoplastic immunotherapy: Secondary | ICD-10-CM | POA: Diagnosis not present

## 2022-03-29 DIAGNOSIS — D259 Leiomyoma of uterus, unspecified: Secondary | ICD-10-CM | POA: Diagnosis not present

## 2022-03-29 DIAGNOSIS — D3502 Benign neoplasm of left adrenal gland: Secondary | ICD-10-CM | POA: Diagnosis not present

## 2022-03-29 DIAGNOSIS — C187 Malignant neoplasm of sigmoid colon: Secondary | ICD-10-CM

## 2022-03-29 DIAGNOSIS — K8020 Calculus of gallbladder without cholecystitis without obstruction: Secondary | ICD-10-CM | POA: Diagnosis not present

## 2022-03-29 DIAGNOSIS — M797 Fibromyalgia: Secondary | ICD-10-CM | POA: Diagnosis not present

## 2022-03-29 DIAGNOSIS — D398 Neoplasm of uncertain behavior of other specified female genital organs: Secondary | ICD-10-CM

## 2022-03-29 DIAGNOSIS — K5720 Diverticulitis of large intestine with perforation and abscess without bleeding: Secondary | ICD-10-CM | POA: Diagnosis not present

## 2022-03-29 DIAGNOSIS — R21 Rash and other nonspecific skin eruption: Secondary | ICD-10-CM | POA: Diagnosis not present

## 2022-03-29 DIAGNOSIS — N9489 Other specified conditions associated with female genital organs and menstrual cycle: Secondary | ICD-10-CM

## 2022-03-29 NOTE — Patient Instructions (Signed)
It was a pleasure to see you in clinic today. - I have recommended that you undergo a PET scan and an MRI of the pelvis to better characterize the pelvic masses. - Return visit planned following the imaging studies to discuss recommendations.  Thank you very much for allowing me to provide care for you today.  I appreciate your confidence in choosing our Gynecologic Oncology team at [facility].  If you have any questions about your visit today please call our office or send [place] a MyChart message and we will get back to you as soon as possible.

## 2022-03-30 ENCOUNTER — Encounter: Payer: Self-pay | Admitting: Psychiatry

## 2022-03-30 ENCOUNTER — Encounter: Payer: Self-pay | Admitting: Hematology

## 2022-03-31 LAB — CYTOLOGY - PAP
Comment: NEGATIVE
Diagnosis: NEGATIVE
High risk HPV: NEGATIVE

## 2022-04-05 ENCOUNTER — Inpatient Hospital Stay: Payer: BC Managed Care – PPO

## 2022-04-05 ENCOUNTER — Inpatient Hospital Stay (HOSPITAL_BASED_OUTPATIENT_CLINIC_OR_DEPARTMENT_OTHER): Payer: BC Managed Care – PPO | Admitting: Hematology

## 2022-04-05 ENCOUNTER — Other Ambulatory Visit: Payer: Self-pay

## 2022-04-05 ENCOUNTER — Encounter: Payer: Self-pay | Admitting: Nurse Practitioner

## 2022-04-05 ENCOUNTER — Inpatient Hospital Stay (HOSPITAL_BASED_OUTPATIENT_CLINIC_OR_DEPARTMENT_OTHER): Payer: BC Managed Care – PPO | Admitting: Nurse Practitioner

## 2022-04-05 VITALS — BP 135/83 | HR 79 | Temp 99.1°F | Resp 17

## 2022-04-05 VITALS — BP 133/66 | HR 77 | Temp 99.0°F | Resp 16 | Ht 63.0 in | Wt 203.1 lb

## 2022-04-05 DIAGNOSIS — Z452 Encounter for adjustment and management of vascular access device: Secondary | ICD-10-CM

## 2022-04-05 DIAGNOSIS — Z515 Encounter for palliative care: Secondary | ICD-10-CM

## 2022-04-05 DIAGNOSIS — C187 Malignant neoplasm of sigmoid colon: Secondary | ICD-10-CM

## 2022-04-05 DIAGNOSIS — G893 Neoplasm related pain (acute) (chronic): Secondary | ICD-10-CM | POA: Diagnosis not present

## 2022-04-05 DIAGNOSIS — K5900 Constipation, unspecified: Secondary | ICD-10-CM | POA: Diagnosis not present

## 2022-04-05 DIAGNOSIS — M797 Fibromyalgia: Secondary | ICD-10-CM | POA: Diagnosis not present

## 2022-04-05 DIAGNOSIS — R530 Neoplastic (malignant) related fatigue: Secondary | ICD-10-CM

## 2022-04-05 DIAGNOSIS — R21 Rash and other nonspecific skin eruption: Secondary | ICD-10-CM | POA: Diagnosis not present

## 2022-04-05 DIAGNOSIS — C786 Secondary malignant neoplasm of retroperitoneum and peritoneum: Secondary | ICD-10-CM | POA: Diagnosis not present

## 2022-04-05 DIAGNOSIS — D259 Leiomyoma of uterus, unspecified: Secondary | ICD-10-CM | POA: Diagnosis not present

## 2022-04-05 DIAGNOSIS — D649 Anemia, unspecified: Secondary | ICD-10-CM

## 2022-04-05 DIAGNOSIS — C7951 Secondary malignant neoplasm of bone: Secondary | ICD-10-CM | POA: Diagnosis not present

## 2022-04-05 DIAGNOSIS — I10 Essential (primary) hypertension: Secondary | ICD-10-CM | POA: Diagnosis not present

## 2022-04-05 DIAGNOSIS — F419 Anxiety disorder, unspecified: Secondary | ICD-10-CM | POA: Diagnosis not present

## 2022-04-05 DIAGNOSIS — D3502 Benign neoplasm of left adrenal gland: Secondary | ICD-10-CM | POA: Diagnosis not present

## 2022-04-05 DIAGNOSIS — K760 Fatty (change of) liver, not elsewhere classified: Secondary | ICD-10-CM | POA: Diagnosis not present

## 2022-04-05 DIAGNOSIS — Z5112 Encounter for antineoplastic immunotherapy: Secondary | ICD-10-CM | POA: Diagnosis not present

## 2022-04-05 DIAGNOSIS — C7972 Secondary malignant neoplasm of left adrenal gland: Secondary | ICD-10-CM | POA: Diagnosis not present

## 2022-04-05 DIAGNOSIS — K8020 Calculus of gallbladder without cholecystitis without obstruction: Secondary | ICD-10-CM | POA: Diagnosis not present

## 2022-04-05 DIAGNOSIS — K5720 Diverticulitis of large intestine with perforation and abscess without bleeding: Secondary | ICD-10-CM | POA: Diagnosis not present

## 2022-04-05 DIAGNOSIS — D509 Iron deficiency anemia, unspecified: Secondary | ICD-10-CM

## 2022-04-05 DIAGNOSIS — F32A Depression, unspecified: Secondary | ICD-10-CM | POA: Diagnosis not present

## 2022-04-05 LAB — CMP (CANCER CENTER ONLY)
ALT: 18 U/L (ref 0–44)
AST: 18 U/L (ref 15–41)
Albumin: 3.8 g/dL (ref 3.5–5.0)
Alkaline Phosphatase: 133 U/L — ABNORMAL HIGH (ref 38–126)
Anion gap: 6 (ref 5–15)
BUN: 18 mg/dL (ref 6–20)
CO2: 24 mmol/L (ref 22–32)
Calcium: 8.9 mg/dL (ref 8.9–10.3)
Chloride: 107 mmol/L (ref 98–111)
Creatinine: 0.77 mg/dL (ref 0.44–1.00)
GFR, Estimated: >60 mL/min (ref >60)
Glucose, Bld: 90 mg/dL (ref 70–99)
Potassium: 4 mmol/L (ref 3.5–5.1)
Sodium: 137 mmol/L (ref 135–145)
Total Bilirubin: 0.3 mg/dL (ref 0.3–1.2)
Total Protein: 7.5 g/dL (ref 6.5–8.1)

## 2022-04-05 LAB — CBC WITH DIFFERENTIAL (CANCER CENTER ONLY)
Abs Immature Granulocytes: 0.01 10*3/uL (ref 0.00–0.07)
Basophils Absolute: 0 10*3/uL (ref 0.0–0.1)
Basophils Relative: 1 %
Eosinophils Absolute: 0.1 10*3/uL (ref 0.0–0.5)
Eosinophils Relative: 2 %
HCT: 30.5 % — ABNORMAL LOW (ref 36.0–46.0)
Hemoglobin: 9.7 g/dL — ABNORMAL LOW (ref 12.0–15.0)
Immature Granulocytes: 0 %
Lymphocytes Relative: 31 %
Lymphs Abs: 1.5 10*3/uL (ref 0.7–4.0)
MCH: 25.7 pg — ABNORMAL LOW (ref 26.0–34.0)
MCHC: 31.8 g/dL (ref 30.0–36.0)
MCV: 80.7 fL (ref 80.0–100.0)
Monocytes Absolute: 0.3 10*3/uL (ref 0.1–1.0)
Monocytes Relative: 6 %
Neutro Abs: 3 10*3/uL (ref 1.7–7.7)
Neutrophils Relative %: 60 %
Platelet Count: 419 10*3/uL — ABNORMAL HIGH (ref 150–400)
RBC: 3.78 MIL/uL — ABNORMAL LOW (ref 3.87–5.11)
RDW: 17.8 % — ABNORMAL HIGH (ref 11.5–15.5)
WBC Count: 4.9 10*3/uL (ref 4.0–10.5)
nRBC: 0 % (ref 0.0–0.2)

## 2022-04-05 LAB — IRON AND IRON BINDING CAPACITY (CC-WL,HP ONLY)
Iron: 47 ug/dL (ref 28–170)
Saturation Ratios: 16 % (ref 10.4–31.8)
TIBC: 294 ug/dL (ref 250–450)
UIBC: 247 ug/dL (ref 148–442)

## 2022-04-05 LAB — CEA (IN HOUSE-CHCC): CEA (CHCC-In House): <1 ng/mL (ref 0.00–5.00)

## 2022-04-05 LAB — PREGNANCY, URINE: Preg Test, Ur: NEGATIVE

## 2022-04-05 MED ORDER — SODIUM CHLORIDE 0.9 % IV SOLN: Freq: Once | INTRAVENOUS | Status: AC

## 2022-04-05 MED ORDER — SODIUM CHLORIDE 0.9% FLUSH
10.0000 mL | Freq: Once | INTRAVENOUS | Status: AC
  Administered 2022-04-05: 10 mL

## 2022-04-05 MED ORDER — SODIUM CHLORIDE 0.9% FLUSH
10.0000 mL | INTRAVENOUS | Status: DC | PRN
  Administered 2022-04-05: 10 mL

## 2022-04-05 MED ORDER — HEPARIN SOD (PORK) LOCK FLUSH 100 UNIT/ML IV SOLN
500.0000 [IU] | Freq: Once | INTRAVENOUS | Status: AC | PRN
  Administered 2022-04-05: 500 [IU]

## 2022-04-05 MED ORDER — SODIUM CHLORIDE 0.9 % IV SOLN
200.0000 mg | Freq: Once | INTRAVENOUS | Status: AC
  Administered 2022-04-05: 200 mg via INTRAVENOUS
  Filled 2022-04-05: qty 200

## 2022-04-05 NOTE — Progress Notes (Signed)
Pt declined to stay for post 30 obs, ambulatory to lobby with VSS upon discharge

## 2022-04-05 NOTE — Progress Notes (Signed)
Give venofer '200mg'$  today per Dr Burr Medico

## 2022-04-05 NOTE — Progress Notes (Signed)
[place]  [phone] [phone]   Name: Mikayla Rios Date: 04/05/2022 MRN: [id number]  DOB: Jan 10, 1986  Patient Care Team: [name], MD as PCP - General (Internal Medicine) [name], MD as PCP - Cardiology (Cardiology) [name], [name] as Physician Assistant (Emergency Medicine) [name], MD as Consulting Physician (Oncology) [name], MD as Consulting Physician (General Surgery) [name], [name], MD as Consulting Physician (Neurology) [name], [name], MD as Consulting Physician (Obstetrics and Gynecology) [name], MD as Consulting Physician (Gastroenterology) [name], [name], NP as Nurse Practitioner (Nurse Practitioner)    INTERVAL HISTORY: Mikayla Rios is a 36 y.o. 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 Recent CT scan on 06/16/21 showed progressive metastatic disease. Palliative ask to see for symptom management.    SOCIAL HISTORY:     reports that she has never smoked. She has never used smokeless tobacco. She reports that she does not currently use alcohol. She reports that she does not use drugs.  ADVANCE DIRECTIVES:    CODE STATUS:   PAST MEDICAL HISTORY: Past Medical History:  Diagnosis Date   Allergy    seasonal   Anemia    Blood infection 1985   Blood transfusion without reported diagnosis    Diverticulitis    Family history of breast cancer    Family history of colon cancer    Family history of stomach cancer    Hypertension    Obesity    Sleep apnea     ALLERGIES:  is allergic to chlorhexidine gluconate, shellfish allergy, and penicillins.  MEDICATIONS:  Current Outpatient Medications  Medication Sig Dispense Refill   ALPRAZolam (XANAX) 0.25 MG tablet Take 1 tablet (0.25 mg total) by mouth at bedtime as  needed for anxiety. 30 tablet 0   amLODipine (NORVASC) 10 MG tablet TAKE 1 TABLET BY MOUTH EVERY DAY 90 tablet 3   DULoxetine (CYMBALTA) 60 MG capsule TAKE 1 CAPSULE BY MOUTH EVERY DAY 90 capsule 2   lidocaine-prilocaine (EMLA) cream Apply 1 Application topically as needed. 30 g 0   metoprolol tartrate 75 MG TABS Take 75 mg by mouth 2 (two) times daily. 90 tablet 3   oxyCODONE-acetaminophen (PERCOCET) 7.5-325 MG tablet Take 1 tablet by mouth every 8 (eight) hours as needed for severe pain. 30 tablet 0   POTASSIUM PO Take by mouth.     No current facility-administered medications for this visit.   Facility-Administered Medications Ordered in Other Visits  Medication Dose Route Frequency Provider Last Rate Last Admin   fentaNYL (SUBLIMAZE) injection   Intravenous PRN Mir, Paula Libra, MD   50 mcg at 10/02/21 1436   midazolam (VERSED) injection   Intravenous PRN Mir, Paula Libra, MD   1 mg at 10/02/21 1436   sodium chloride flush (NS) 0.9 % injection 10 mL  10 mL Intracatheter PRN [name], MD   10 mL at 04/05/22 1058    VITAL SIGNS: There were no vitals taken for this visit. There were no vitals filed for this visit.    Estimated body mass index is 35.98 kg/m as calculated from the following:   Height as of an earlier encounter on 04/05/22: '5\' 3"'$  (1.6 m).   Weight as of an earlier encounter on 04/05/22: 203 lb 1.6 oz (92.1 kg).   PERFORMANCE STATUS (ECOG) : 1 - Symptomatic but completely ambulatory  Physical Exam General: NAD Cardiovascular:RRR Respiratory: normal breathing pattern  Abdomen: soft, tender, + bowel sounds, colostomy in place Neurological: AAO x4, mood appropriate  IMPRESSION:  I saw Mikayla Rios during her infusion today. Mikayla Rios is continues to do well. Occasional fatigue. Appetite is good. Taking things one day at a time. Is appropriately grieving and healing from the loss of her mother.   Neoplams related pain Pain is much improved. Does not require daily  medications however does have pain on occasions. She is tolerating Cymbalta '60mg'$ . Ocycodone on hand as needed for severe breakthrough pain. Not taking regularly.   Will continue to closely monitor.   2. Constipation No concerns with constipation. Encouraged daily bowel regimen to prevent constipation.   3. Anxiety Xanax as needed for anxiety due to the passing of her mother and emotional stressors. Is not taking daily. Able to get better sleep. Grieving appropriately.      PLAN: Oxycodone/APAP 7.'5mg'$ /'325mg'$  every 8 hours as needed for pain. Does not require daily.  Cymbalta 60 mg twice daily Miralax daily for constipation Xanax 0.'25mg'$  as needed at bedtime for sleep/anxiety. I will plan to see her back in 4-6 weeks in collaboration with future appointments.   Patient expressed understanding and was in agreement with this plan. She also understands that She can call the clinic at any time with any questions, concerns, or complaints.    Time Total: 20 min   Visit consisted of counseling and education dealing with the complex and emotionally intense issues of symptom management and palliative care in the setting of serious and potentially life-threatening illness.Greater than 50%  of this time was spent counseling and coordinating care related to the above assessment and plan.  Alda Lea, AGPCNP-BC  Palliative Medicine Team/[facility]

## 2022-04-05 NOTE — Progress Notes (Signed)
[place]   Telephone:([phone]) [phone] Fax:([phone]   Clinic Follow up Note   Patient Care Team: [name], MD as PCP - General (Internal Medicine) [name], MD as PCP - Cardiology (Cardiology) [name], [name] as Physician Assistant (Emergency Medicine) [name], MD as Consulting Physician (Oncology) [name], MD as Consulting Physician (General Surgery) [name], [name], MD as Consulting Physician (Neurology) [name], [name], MD as Consulting Physician (Obstetrics and Gynecology) [name], MD as Consulting Physician (Gastroenterology) [name], [name], NP as Nurse Practitioner (Nurse Practitioner)  Date of Service:  04/05/2022  CHIEF COMPLAINT: f/u of colon cancer  CURRENT THERAPY:  Keytruda, q21 days, starting 09/08/21 Iv Venofer as needed if ferritin <100   ASSESSMENT & PLAN:  Mikayla Rios is a 36 y.o. female with   1. 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 She continued to have abdominal pain and drainage, undergoing drain exchange three times. -she developed worsening weakness and was admitted on 04/10/21. She was taken for emergent small bowel and left colon resection on 04/15/21 under Dr. Johney Maine. 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 Margins negative for invasive carcinoma, but low-grade dysplasia involves proximal margin. All 5 lymph nodes negative (0/5). MSI low. -repeat CT AP on 06/16/21 showed progressive metastatic disease involving soft tissue masses in left psoas muscle and left lateral abdominal wall and peritoneal masses, and new mild retroperitoneal  lymphadenopathy.  -baseline CEA on 06/22/21 was elevated at 87.41. -she started FOLFOX on 06/22/21 but did not respond -she switched to [facility] on 09/08/21. She has been tolerating well overall but has developed joint pain which could be related, overall manageable. She is clinically doing better also with more energy and less abdominal pain   -her CEA has normalized on immunotherapy. -CT CAP on 03/15/22 again showed mixed response: slight increase in bilateral ovarian lesions; diminished ascites throughout abdomen/pelvis; similar appearance of peritoneal thickening and nodularity; slight diminishing of left retroperitoneum and abdominal wall masses. Will continue Keytruda for now. -she continues to tolerate treatment well. Labs reviewed, overall stable to improved, adequate to proceed with [place] today.    2. Bilateral ovarian lesions -most recent CT CAP 03/15/22 showed slight increase in size of the lesions. -pelvic US 03/18/22 showed: 5.4 cm simple cyst of right ovary; 8.4 cm cystic lesion of left ovary. -she is scheduled for further evaluation with pelvis MRI tomorrow, 10/31, and PET scan 04/08/22.  3. Moderate anemia and thrombocytosis -blood transfusion if Hg<8.0 -patient reports struggling with some constipation with taking iron pills in the past. Will give IV iron as needed to help bolster iron levels, last 02/22/22. -hgb improved to 9.7, plt 419k today (04/05/22).   4. Genetics, FAP -FO showed MSI-High disease, and multiple mutations including APC, [name]6, and BRCA2 mutations.  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  -we previously discussed testing for her children when they are of age.     PLAN: -proceed with Cycle 11 of Keytruda today              -palliative care will see her today -pelvis MRI tomorrow, 10/31 -PET scan 11/2 -phone f/u with Dr. Ernestina Patches 11/6 -lab, flush, f/u, and Keytruda in 3 weeks,  sooner if needed based on her scan findings   -will give one more dose Venofer today   No problem-specific Assessment & Plan notes found for this encounter.   SUMMARY OF ONCOLOGIC HISTORY: Oncology History Overview Note   Cancer Staging  Malignant neoplasm of sigmoid colon [facility]) Staging form: Colon and Rectum, AJCC 8th Edition - Pathologic stage from 04/15/2021: Stage IIC (pT4b, pN0, cM0) - Signed by [name], MD on 06/12/2021    Malignant neoplasm of sigmoid colon [facility])   Initial Diagnosis   Malignant neoplasm of sigmoid colon ([place])   11/04/2020 Imaging   EXAM: CT ABDOMEN AND PELVIS WITHOUT CONTRAST  IMPRESSION: 1. Perforating descending colonic diverticulitis with multiple abdominopelvic gas and fluid collections, measuring up to 5.7 cm and further described above. 2. Cholelithiasis without findings of acute cholecystitis. 3. 3.6 cm benign left adrenal adenoma. 4. Leiomyomatous uterus. 5. 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   11/07/2020 Imaging   EXAM: CT ABDOMEN AND PELVIS WITHOUT CONTRAST  IMPRESSION: Continued wall thickening is seen involving descending colon suggesting infectious or inflammatory colitis or perforated diverticulitis. There is an adjacent fluid collection measuring 7.0 x 5.0 cm consistent with abscess which is significantly enlarged compared to prior exam. Adjacent to the abscess, there is a severely thickened small bowel loop most consistent with secondary inflammation.   5.2 x 3.5 cm fluid collection is noted within the left psoas muscle consistent with abscess which is significantly enlarged compared to prior exam. 4.4 x 3.8 cm fluid collection consistent with abscess is noted in the left retroperitoneal region which is also enlarged compared to prior exam.   Fibroid uterus.   Cholelithiasis.   11/27/2020 Imaging   EXAM: CT ABDOMEN AND PELVIS WITHOUT CONTRAST  IMPRESSION: 1.  Significantly interval decreased size of the previously visualized left retroperitoneal and left anterior mid abdominal fluid collections. There is persistent fat stranding and mural thickening of the mid descending colon at this level. 2. Unchanged leiomyomatous uterus. 3. Unchanged cholelithiasis.   12/11/2020 Imaging   EXAM: CT ABDOMEN AND PELVIS WITHOUT CONTRAST  IMPRESSION: 1. Stable inflammatory changes centered around the distal descending colon in the left lower abdomen. Pericolonic inflammatory changes have minimally changed since 11/27/2020. Small pocket of gas medial to the colon is probably associated with a fistula or small residual abscess collection. 2. Stable position of the two percutaneous drains. The more anterior drain may be extending through a portion of the small bowel. 3. Cholelithiasis. 4. Fibroid uterus. Cannot exclude an ovarian/adnexal cystic structure near the uterine fundus. 5. Left adrenal adenoma.   01/07/2021 Imaging   EXAM: CT ABDOMEN AND PELVIS WITH CONTRAST  IMPRESSION: 1. No new abscesses identified. Similar degree of soft tissue thickening seen in the left pericolonic region. The degree of persistent soft tissues thickening in the pericolonic space further raises suspicions for malignancy. Further evaluation with colonoscopy should be performed. 2. Left retroperitoneal abscess drain unchanged in position. Anterior left abdominal drain again seen terminating within small bowel loop. 3. 2.6 cm mildly sclerotic lesion noted in the L2 vertebral body. Further evaluation with contrast enhanced lumbar spine MRI should be performed.   02/04/2021 Imaging   EXAM: CT ABDOMEN AND PELVIS WITH CONTRAST  IMPRESSION: No new abdominopelvic collections or abscess development in the 1 month interval.   Left anterior drain remains within a loop of small bowel, unchanged.   Left lateral abscess drain remains in the  retroperitoneal space adjacent to the  iliopsoas muscle with a small amount of residual fluid and air but no measurable collection.   Stable soft tissue prominence and pericolonic strandy edema/inflammation about the left descending colon compatible with residual diverticulitis/colitis. Difficult to exclude underlying transmural lesion.   04/01/2021 Imaging   EXAM: CT ABDOMEN AND PELVIS WITH CONTRAST  IMPRESSION: There appears to be significant enhancement and wall thickening involving the descending colon with some degree of traction and involvement of adjacent small bowel loops. This is consistent with the history of diverticulitis and perforation. Stable position of percutaneous drainage catheter is seen adjacent to left psoas muscle with no significant residual fluid remaining. The other percutaneous drainage catheter that was previously noted to be within small bowel loop on prior exam in the left lower quadrant, has significantly retracted and appears to be outside of the peritoneal space at this time.   Since the prior exam, there does appear to be some degree of rotation involving mesenteric vessels and structures in the right lower quadrant, suggesting partial volvulus or malrotation. There is the interval development of several lymph nodes in this area, most likely inflammatory or reactive in etiology. Mild amount of free fluid is also noted in the pelvis. However, no significant bowel wall thickening or dilatation is seen in this area. These results will be called to the ordering clinician or representative by the Radiologist Assistant, and communication documented in the PACS or zVision Dashboard.   Stable uterine fibroid.   Hepatic steatosis.   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   Cholelithiasis.   04/10/2021 Imaging   EXAM: CT ANGIOGRAPHY CHEST CT ABDOMEN AND PELVIS WITH CONTRAST  IMPRESSION: 1. Again seen are findings compatible with descending colon diverticulitis with perforation. Free air and  inflammation have increased in the interval. 2. New lobulated enhancing fluid collection posterior to this segment of inflamed colon measuring 8.5 x 3.5 x 10.0 cm. This collection now invades the adjacent iliopsoas muscle as well as extends through the left lateral abdominal wall. The tip of the drainage catheter is in this collection. Findings are compatible with abscess. 3. Anterior left percutaneous drainage catheter tip has been pulled back and is now within the subcutaneous tissues. 4. Trace free fluid. 5. No pulmonary embolism.  No acute cardiopulmonary process. 6. Cholelithiasis. 7. Fatty infiltration of the liver.   04/15/2021 Definitive Surgery   FINAL MICROSCOPIC DIAGNOSIS:   A. SMALL BOWEL, RESECTION:  - Adenocarcinoma.  - No carcinoma identified in 1 lymph node.   B. PERFORATED LEFT COLON, RESECTION:  - Moderately differentiated colonic adenocarcinoma.  - Tumor extends into pericolonic adipose tissue, and is strongly  suggestive of invasion into small bowel.  See oncology table/comments.  - No carcinoma identified in 4 lymph nodes (0/4).  - Tubular adenoma with high-grade dysplasia, 1.  - Tubular adenomas with low grade dysplasia, 3.   Comments: The size of the tumor is difficult to estimate secondary to the disrupted nature of the specimen, as well as the infiltrative nature of the tumor.  Tumor can be identified as definitely invading into the pericolonic adipose tissue (block B4), but given the extreme disruption of the tissue, and the involvement of the small bowel by what appears to be a colonic adenocarcinoma, I favor perforation of the large bowel with direct invasion into the small bowel.  Accordingly, I believe this is best regarded as a pT4b lesion.   ADDENDUM:  Mismatch Repair Protein (IHC)  SUMMARY INTERPRETATION: ABNORMAL  There is  loss of the major MMR protein [name]6: This indicates a high probability that a hereditary germline mutation is present and  referral to genetic counseling is warranted. It is recommended that the loss of protein expression be correlated with molecular based microsatellite instability testing.   IHC EXPRESSION RESULTS  TEST           RESULT  MLH1:          Preserved nuclear expression  [name]2:          Preserved nuclear expression  [name]6:          LOSS OF NUCLEAR EXPRESSION  PMS2:          Preserved nuclear expression    04/15/2021 Cancer Staging   Staging form: Colon and Rectum, AJCC 8th Edition - Pathologic stage from 04/15/2021: Stage IIC (pT4b, pN0, cM0) - Signed by [name], MD on 06/12/2021 Stage prefix: Initial diagnosis Total positive nodes: 0 Histologic grading system: 4 grade system Histologic grade (G): G2 Residual tumor (R): R0 - None   04/29/2021 Imaging   EXAM: CT ABDOMEN AND PELVIS WITH CONTRAST  IMPRESSION: Continued left retroperitoneal abscesses inferior to the left kidney, involving the left psoas muscle and left abdominal wall musculature. Overall size is decreased since prior study. Interval removal of left lower quadrant abscess drainage catheter.   New fluid collection in the cul-de-sac and wrapping around the uterus concerning for abscess.   Cholelithiasis.   Small left pleural effusion.  Bibasilar atelectasis.   Stable left adrenal adenoma.  ADDENDUM: After discussing the case with Dr. Dwaine Gale in interventional radiology, it was noted that the fluid in the pelvis originally thought to be in the cul-de-sac is likely within the vagina, best seen on sagittal imaging. Recommend speculum exam.   Also, in the left lateral wall in the area of prior abscess in the lateral wall abdominal musculature, some of the abdomen soft tissue appears to be enhancing. While this could be infectious, cannot exclude tumor seeding in the left lateral abdominal wall, measuring 6.9 x 3.8 cm on image 64 of series 2.   06/16/2021 Imaging   EXAM: CT ABDOMEN AND PELVIS WITH  CONTRAST  IMPRESSION: Increased size of bulky soft tissue masses involving the left psoas muscle and left lateral abdominal wall soft tissues, consistent with progressive metastatic disease.   Significant progression of multiple peritoneal masses throughout the abdomen and pelvis, consistent with peritoneal carcinomatosis.   New mild retroperitoneal lymphadenopathy, consistent with metastatic disease.   Stable uterine fibroid and left adrenal adenoma.   Cholelithiasis, without evidence of cholecystitis.   06/22/2021 - 08/19/2021 Chemotherapy   Patient is on Treatment Plan : COLORECTAL FOLFOX q14d x 6 months     06/22/2021 Tumor Marker   Patient's tumor was tested for the following markers: CEA. Results of the tumor marker test revealed 87.41.   07/10/2021 Pathology Results   FINAL MICROSCOPIC DIAGNOSIS:   A. PERITONEAL MASS, LEFT UPPER ABDOMINAL QUADRANT, BIOPSY:  -  Metastatic adenocarcinoma with necrosis, histologically consistent with colon primary.     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 The report date is 07/16/2021. The remainder of testing was negative/normal.  The CancerNext-Expanded + RNAinsight gene panel offered by [employer] and includes sequencing and rearrangement analysis for the following 77 genes: IP, ALK, APC*, ATM*, AXIN2, BAP1, BARD1, BLM, BMPR1A, BRCA1*, BRCA2*, BRIP1*, CDC73, CDH1*,CDK4, CDKN1B, CDKN2A, CHEK2*, CTNNA1, DICER1, FANCC, FH, FLCN, GALNT12, KIF1B, LZTR1, MAX, MEN1, MET,  MLH1*, [name]2*, [name]3, [name]6*, MUTYH*, NBN, NF1*, NF2, NTHL1, PALB2*, PHOX2B, PMS2*, POT1, PRKAR1A, PTCH1, PTEN*, RAD51C*, RAD51D*,RB1, RECQL, RET, SDHA, SDHAF2, SDHB, SDHC, SDHD, SMAD4, SMARCA4, SMARCB1, SMARCE1, STK11, SUFU, TMEM127, TP53*,TSC1, TSC2, VHL and XRCC2 (sequencing and deletion/duplication); EGFR, EGLN1, HOXB13, KIT, MITF, PDGFRA, POLD1 and POLE (sequencing only); EPCAM and GREM1  (deletion/duplication only).   08/28/2021 Imaging   EXAM: CT CHEST, ABDOMEN, AND PELVIS WITH CONTRAST  IMPRESSION: 1. Continued interval progression of multiple metastatic soft tissue nodules/masses in the abdomen and pelvis. 2. No evidence for metastatic disease in the chest. 3. Similar appearance of the subtle lesion in the L2 vertebral body, suspicious for metastatic involvement. 4. Cholelithiasis.   09/03/2021 -  Chemotherapy   Patient is on Treatment Plan : COLORECTAL Pembrolizumab (200) q21d     09/08/2021 - 02/01/2022 Chemotherapy   Patient is on Treatment Plan : COLORECTAL Pembrolizumab (200) q21d     01/08/2022 Imaging   CT CAP IMPRESSION: 1. Mixed response to therapy. Interval decrease in size of the conglomerate multilobulated mass extending from the left psoas into the left posterior pararenal space through the left lateral abdominal wall along the tract of the previously placed percutaneous drainage catheter. Interval decrease in size of right adnexal mass and omental masses within the right lower quadrant of the abdomen. Interval development of peritoneal carcinomatosis with omental caking within the left lower quadrant of the abdomen and left subdiaphragmatic region adjacent to the spleen and along the left pericolic gutter as well as development of ascites appearing loculated within the anterior peritoneum. 2. Interval development of mild left hydronephrosis secondary to stricturing of the mid left ureter. 3. Stable left adrenal metastasis. 4. Stable sclerotic lesion within the L2 vertebral body. No new lytic or blastic bone lesions are seen. 5. Cholelithiasis. 6. No evidence of intrathoracic metastatic disease. 7. 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 Moderate colonic stool burden. No evidence of obstruction.   03/15/2022 Imaging   IMPRESSION: 1. Status post sigmoid colon resection with left  lower quadrant end colostomy. 2. Multicystic bilateral ovarian lesions are slightly increased in size. 3. Diminished, small volume of loculated appearing ascites throughout the abdomen and pelvis, with similar appearance of peritoneal thickening and nodularity throughout. 4. Heterogeneously calcified, conglomerate masses in the left retroperitoneum and abdominal wall are slightly diminished in size. 5. Left adrenal lesion not significantly changed. 6. Unchanged faintly sclerotic metastatic lesion of L2. 7. Constellation of findings is consistent with mixed response to treatment however with overall little interval change. 8. Minimal residual left hydronephrosis and proximal hydroureter, improved compared to prior examination. 9. Cholelithiasis. 10. Coronary artery disease, significantly advanced for patient age.   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 She was last seen by NP Mikayla Rios on 03/16/22. She presents to the clinic accompanied by her husband. She reports she is doing well overall, no new changes. She reports she is managing her fibromyalgia with exercises/stretches and only occasionally needs tylenol. She notes she is able to do things around the house and do her own shopping. Her oldest child turned 65 last week.   All other systems were reviewed with the patient and are negative.  MEDICAL HISTORY:  Past Medical History:  Diagnosis Date   Allergy    seasonal   Anemia    Blood infection 1985   Blood transfusion without reported diagnosis    Diverticulitis    Family history of breast cancer    Family history of colon cancer    Family history of stomach cancer    Hypertension    Obesity    Sleep apnea     SURGICAL  HISTORY: Past Surgical History:  Procedure Laterality Date   COLECTOMY WITH COLOSTOMY CREATION/HARTMANN PROCEDURE N/A 04/15/2021   Procedure: COLOSTOMY CREATION/HARTMANN PROCEDURE;  Surgeon: [name], MD;  Location: [facility]S;  Service: General;  Laterality: N/A;   IR CATHETER TUBE CHANGE  12/12/2020   IR CATHETER TUBE CHANGE  01/27/2021   IR CATHETER TUBE CHANGE  02/19/2021   IR CATHETER TUBE CHANGE  04/13/2021   IR IMAGING GUIDED PORT INSERTION  10/02/2021   IR RADIOLOGIST EVAL & MGMT  11/27/2020   IR RADIOLOGIST EVAL & MGMT  12/11/2020   IR RADIOLOGIST EVAL & MGMT  01/07/2021   IR RADIOLOGIST EVAL & MGMT  02/04/2021   IR SINUS/FIST TUBE CHK-NON GI  12/12/2020   IR SINUS/FIST TUBE CHK-NON GI  02/19/2021   LAPAROSCOPIC PARTIAL COLECTOMY N/A 04/15/2021   Procedure: LAPAROSCOPIC ASSISTED HARTMANN RESECTION;  Surgeon: [name], MD;  Location: [facility];  Service: General;  Laterality: N/A;    I have reviewed the social history and family history with the patient and they are unchanged from previous note.  ALLERGIES:  is allergic to chlorhexidine gluconate, shellfish allergy, and penicillins.  MEDICATIONS:  Current Outpatient Medications  Medication Sig Dispense Refill   ALPRAZolam (XANAX) 0.25 MG tablet Take 1 tablet (0.25 mg total) by mouth at bedtime as needed for anxiety. 30 tablet 0   amLODipine (NORVASC) 10 MG tablet TAKE 1 TABLET BY MOUTH EVERY DAY 90 tablet 3   DULoxetine (CYMBALTA) 60 MG capsule TAKE 1 CAPSULE BY MOUTH EVERY DAY 90 capsule 2   lidocaine-prilocaine (EMLA) cream Apply 1 Application topically as needed. 30 g 0   metoprolol tartrate 75 MG TABS Take 75 mg by mouth 2 (two) times daily. 90 tablet 3   oxyCODONE-acetaminophen (PERCOCET) 7.5-325 MG tablet Take 1 tablet by mouth every 8 (eight) hours as needed for severe pain. 30 tablet 0   POTASSIUM PO Take by mouth.     No current facility-administered medications for this visit.   Facility-Administered Medications  Ordered in Other Visits  Medication Dose Route Frequency Provider Last Rate Last Admin   fentaNYL (SUBLIMAZE) injection   Intravenous PRN Mir, Paula Libra, MD   50 mcg at 10/02/21 1436   heparin lock flush 100 unit/mL  500 Units Intracatheter Once PRN [name], MD       iron sucrose (VENOFER) 200 mg in sodium chloride 0.9 % 100 mL IVPB  200 mg Intravenous Once [name], MD       midazolam (VERSED) injection   Intravenous PRN Mir, Paula Libra, MD   1 mg at 10/02/21 1436   pembrolizumab (KEYTRUDA) 200 mg in sodium chloride 0.9 % 50 mL chemo infusion  200 mg Intravenous Once [name], MD       sodium chloride flush (NS) 0.9 % injection 10 mL  10 mL Intracatheter PRN [name], MD        PHYSICAL EXAMINATION: ECOG PERFORMANCE STATUS: 1 - Symptomatic but completely ambulatory  Vitals:   04/05/22 0848  BP: 133/66  Pulse:  77  Resp: 16  Temp: 99 F (37.2 C)  SpO2: 100%   Wt Readings from Last 3 Encounters:  04/05/22 203 lb 1.6 oz (92.1 kg)  03/29/22 202 lb (91.6 kg)  03/16/22 198 lb 11.2 oz (90.1 kg)     GENERAL:alert, no distress and comfortable SKIN: skin color normal, no rashes or significant lesions EYES: normal, Conjunctiva are pink and non-injected, sclera clear  NEURO: alert & oriented x 3 with fluent speech  LABORATORY DATA:  I have reviewed the data as listed    Latest Ref Rng & Units 04/05/2022    8:21 AM 03/16/2022    9:28 AM 02/22/2022    8:19 AM  CBC  WBC 4.0 - 10.5 K/uL 4.9  4.6  5.9   Hemoglobin 12.0 - 15.0 g/dL 9.7  9.3  8.5   Hematocrit 36.0 - 46.0 % 30.5  29.1  27.3   Platelets 150 - 400 K/uL 419  439  515         Latest Ref Rng & Units 04/05/2022    8:21 AM 03/16/2022    9:28 AM 02/22/2022    8:19 AM  CMP  Glucose 70 - 99 mg/dL 90  92  91   BUN 6 - 20 mg/dL _0 Creatinine 0.44 - 1.00 mg/dL 0.77  0.67  0.73   Sodium 135 - 145 mmol/L 137  138  140   Potassium 3.5 - 5.1 mmol/L 4.0  3.6  3.6   Chloride 98 - 111 mmol/L 107  109  109   CO2 22 -  32 mmol/L _1 Calcium 8.9 - 10.3 mg/dL 8.9  8.7  9.1   Total Protein 6.5 - 8.1 g/dL 7.5  7.4  7.7   Total Bilirubin 0.3 - 1.2 mg/dL 0.3  0.3  0.4   Alkaline Phos 38 - 126 U/L 133  149  268   AST 15 - 41 U/L _2 ALT 0 - 44 U/L _3 RADIOGRAPHIC STUDIES: I have personally reviewed the radiological images as listed and agreed with the findings in the report. No results found.    Orders Placed This Encounter  Procedures   CBC with Differential ([place] Only)    Standing Status:   Future    Standing Expiration Date:   04/27/2023   CMP ([place] only)    Standing Status:   Future    Standing Expiration Date:   04/27/2023   All questions were answered. The patient knows to call the clinic with any problems, questions or concerns. No barriers to learning was detected. The total time spent in the appointment was 30 minutes.     [name], MD 04/05/2022   I, Wilburn Mylar, am acting as scribe for [name], MD.   I have reviewed the above documentation for accuracy and completeness, and I agree with the above.

## 2022-04-06 ENCOUNTER — Ambulatory Visit (HOSPITAL_COMMUNITY)
Admission: RE | Admit: 2022-04-06 | Discharge: 2022-04-06 | Disposition: A | Discharge status: Home / Self Care | Payer: BC Managed Care – PPO | Source: Ambulatory Visit | Attending: Psychiatry | Admitting: Psychiatry

## 2022-04-06 ENCOUNTER — Telehealth: Payer: Self-pay | Admitting: Hematology

## 2022-04-06 ENCOUNTER — Encounter (HOSPITAL_COMMUNITY): Payer: Self-pay

## 2022-04-06 DIAGNOSIS — C187 Malignant neoplasm of sigmoid colon: Secondary | ICD-10-CM

## 2022-04-06 DIAGNOSIS — C786 Secondary malignant neoplasm of retroperitoneum and peritoneum: Secondary | ICD-10-CM

## 2022-04-06 DIAGNOSIS — N9489 Other specified conditions associated with female genital organs and menstrual cycle: Secondary | ICD-10-CM

## 2022-04-06 NOTE — Telephone Encounter (Signed)
Per 10/30 los called pt with no answer and no way to leave voicemail

## 2022-04-08 ENCOUNTER — Ambulatory Visit (HOSPITAL_COMMUNITY): Payer: BC Managed Care – PPO

## 2022-04-08 ENCOUNTER — Ambulatory Visit (HOSPITAL_COMMUNITY)
Admission: RE | Admit: 2022-04-08 | Discharge: 2022-04-08 | Disposition: A | Discharge status: Home / Self Care | Payer: BC Managed Care – PPO | Source: Ambulatory Visit | Attending: Psychiatry | Admitting: Psychiatry

## 2022-04-08 DIAGNOSIS — R222 Localized swelling, mass and lump, trunk: Secondary | ICD-10-CM | POA: Diagnosis not present

## 2022-04-08 DIAGNOSIS — N9489 Other specified conditions associated with female genital organs and menstrual cycle: Secondary | ICD-10-CM | POA: Insufficient documentation

## 2022-04-08 DIAGNOSIS — C187 Malignant neoplasm of sigmoid colon: Secondary | ICD-10-CM | POA: Insufficient documentation

## 2022-04-08 DIAGNOSIS — C786 Secondary malignant neoplasm of retroperitoneum and peritoneum: Secondary | ICD-10-CM | POA: Diagnosis not present

## 2022-04-08 DIAGNOSIS — D251 Intramural leiomyoma of uterus: Secondary | ICD-10-CM | POA: Diagnosis not present

## 2022-04-08 DIAGNOSIS — N83201 Unspecified ovarian cyst, right side: Secondary | ICD-10-CM | POA: Diagnosis not present

## 2022-04-08 DIAGNOSIS — N83202 Unspecified ovarian cyst, left side: Secondary | ICD-10-CM | POA: Diagnosis not present

## 2022-04-08 MED ORDER — GADOBUTROL 1 MMOL/ML IV SOLN
10.0000 mL | Freq: Once | INTRAVENOUS | Status: AC | PRN
  Administered 2022-04-08: 10 mL via INTRAVENOUS

## 2022-04-09 ENCOUNTER — Telehealth: Payer: Self-pay | Admitting: *Deleted

## 2022-04-09 NOTE — Telephone Encounter (Signed)
Late entry------several attempts made to reach the patient yesterday via phone, call could not be completed as dialed. Sent the patient a my chart message.    Attempted to reach the patient again today with no luck.

## 2022-04-12 ENCOUNTER — Inpatient Hospital Stay: Payer: BC Managed Care – PPO | Admitting: Psychiatry

## 2022-04-13 ENCOUNTER — Inpatient Hospital Stay: Payer: BC Managed Care – PPO | Attending: Hematology | Admitting: Licensed Clinical Social Worker

## 2022-04-13 ENCOUNTER — Encounter: Payer: Self-pay | Admitting: Hematology

## 2022-04-13 DIAGNOSIS — D398 Neoplasm of uncertain behavior of other specified female genital organs: Secondary | ICD-10-CM | POA: Insufficient documentation

## 2022-04-13 DIAGNOSIS — C786 Secondary malignant neoplasm of retroperitoneum and peritoneum: Secondary | ICD-10-CM | POA: Insufficient documentation

## 2022-04-13 DIAGNOSIS — C187 Malignant neoplasm of sigmoid colon: Secondary | ICD-10-CM | POA: Insufficient documentation

## 2022-04-13 DIAGNOSIS — Z5112 Encounter for antineoplastic immunotherapy: Secondary | ICD-10-CM | POA: Insufficient documentation

## 2022-04-13 NOTE — Progress Notes (Signed)
[place] CSW Progress Note  Clinical Education officer, museum  received an Leisure centre manager from [place] at [employer] requesting a letter on letter head which includes date of diagnosis and current treatment plan in order to apply for financial assistance on behalf of pt.  CSW attempted to reach pt by phone; however, pt's phone has been disconnected.  CSW sent an email to pt requesting permission to send a letter on her behalf to which pt responded giving permission to do so.  Letter drafted and submitted on behalf of pt.  CSW also inquired if pt's phone could be reconnected w/ assistance from the [facility].  Pt states the [employer] would allow her to reconnect her phone.  Full [place] dispersed to pt to reconnect phone.  Pt also turned in her family list for the [employer] and has been added to the spreadsheet.    CSW to continue to provide assistance as appropriate throughout duration of treatment.      Henriette Combs, LCSW

## 2022-04-13 NOTE — Telephone Encounter (Signed)
Scheduled and sent the patient a my chart message with the date and time of the PET

## 2022-04-15 DIAGNOSIS — Z85038 Personal history of other malignant neoplasm of large intestine: Secondary | ICD-10-CM | POA: Diagnosis not present

## 2022-04-15 DIAGNOSIS — Z4801 Encounter for change or removal of surgical wound dressing: Secondary | ICD-10-CM | POA: Diagnosis not present

## 2022-04-15 DIAGNOSIS — S31109A Unspecified open wound of abdominal wall, unspecified quadrant without penetration into peritoneal cavity, initial encounter: Secondary | ICD-10-CM | POA: Diagnosis not present

## 2022-04-15 DIAGNOSIS — Z933 Colostomy status: Secondary | ICD-10-CM | POA: Diagnosis not present

## 2022-04-26 ENCOUNTER — Encounter (HOSPITAL_COMMUNITY)
Admission: RE | Admit: 2022-04-26 | Discharge: 2022-04-26 | Disposition: A | Discharge status: Home / Self Care | Payer: BC Managed Care – PPO | Source: Ambulatory Visit | Attending: Psychiatry | Admitting: Psychiatry

## 2022-04-26 ENCOUNTER — Inpatient Hospital Stay (HOSPITAL_BASED_OUTPATIENT_CLINIC_OR_DEPARTMENT_OTHER): Payer: BC Managed Care – PPO | Admitting: Psychiatry

## 2022-04-26 DIAGNOSIS — C786 Secondary malignant neoplasm of retroperitoneum and peritoneum: Secondary | ICD-10-CM

## 2022-04-26 DIAGNOSIS — D398 Neoplasm of uncertain behavior of other specified female genital organs: Secondary | ICD-10-CM

## 2022-04-26 DIAGNOSIS — C187 Malignant neoplasm of sigmoid colon: Secondary | ICD-10-CM | POA: Insufficient documentation

## 2022-04-26 DIAGNOSIS — C188 Malignant neoplasm of overlapping sites of colon: Secondary | ICD-10-CM | POA: Diagnosis not present

## 2022-04-26 DIAGNOSIS — N9489 Other specified conditions associated with female genital organs and menstrual cycle: Secondary | ICD-10-CM

## 2022-04-26 LAB — GLUCOSE, CAPILLARY: Glucose-Capillary: 93 mg/dL (ref 70–99)

## 2022-04-26 MED ORDER — FLUDEOXYGLUCOSE F - 18 (FDG) INJECTION
10.0000 | Freq: Once | INTRAVENOUS | Status: AC | PRN
  Administered 2022-04-26: 10.14 via INTRAVENOUS

## 2022-04-26 NOTE — Progress Notes (Deleted)
Gynecologic Oncology Return Clinic Visit  Date of Service: 04/26/2022 Referring Provider: [name], MD [address] [place],  [place] [address]***  Assessment & Plan: Mikayla Rios is a 36 y.o. woman with Stage *** who presents for ***.  ***  ***VTE Prophylaxis: - Khorana score = ***  ***Preventative Screening: {Preventative screening:28493}  RTC ***.  [name], MD Gynecologic Oncology   Medical Decision Making I personally spent  TOTAL *** minutes face-to-face and non-face-to-face in the care of this patient, which includes all pre, intra, and post visit time on the date of service.  *** minutes spent reviewing records prior to the visit 20 Minutes in patient contact      *** minutes in other billable services *** minutes charting , conferring with consultants etc.   ----------------------- Reason for Visit: Imaging follow-up, Treatment planning  Treatment History: Oncology History Overview Note   Cancer Staging  Malignant neoplasm of sigmoid colon [facility]) Staging form: Colon and Rectum, AJCC 8th Edition - Pathologic stage from 04/15/2021: Stage IIC (pT4b, pN0, cM0) - Signed by Malachy Mood, MD on 06/12/2021    Malignant neoplasm of sigmoid colon [facility])   Initial Diagnosis   Malignant neoplasm of sigmoid colon (HCC)   11/04/2020 Imaging   EXAM: CT ABDOMEN AND PELVIS WITHOUT CONTRAST  IMPRESSION: 1. Perforating descending colonic diverticulitis with multiple abdominopelvic gas and fluid collections, measuring up to 5.7 cm and further described above. 2. Cholelithiasis without findings of acute cholecystitis. 3. 3.6 cm benign left adrenal adenoma. 4. Leiomyomatous uterus. 5. 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   11/07/2020 Imaging   EXAM: CT ABDOMEN AND PELVIS WITHOUT CONTRAST  IMPRESSION: Continued wall thickening is seen involving  descending colon suggesting infectious or inflammatory colitis or perforated diverticulitis. There is an adjacent fluid collection measuring 7.0 x 5.0 cm consistent with abscess which is significantly enlarged compared to prior exam. Adjacent to the abscess, there is a severely thickened small bowel loop most consistent with secondary inflammation.   5.2 x 3.5 cm fluid collection is noted within the left psoas muscle consistent with abscess which is significantly enlarged compared to prior exam. 4.4 x 3.8 cm fluid collection consistent with abscess is noted in the left retroperitoneal region which is also enlarged compared to prior exam.   Fibroid uterus.   Cholelithiasis.   11/27/2020 Imaging   EXAM: CT ABDOMEN AND PELVIS WITHOUT CONTRAST  IMPRESSION: 1. Significantly interval decreased size of the previously visualized left retroperitoneal and left anterior mid abdominal fluid collections. There is persistent fat stranding and mural thickening of the mid descending colon at this level. 2. Unchanged leiomyomatous uterus. 3. Unchanged cholelithiasis.   12/11/2020 Imaging   EXAM: CT ABDOMEN AND PELVIS WITHOUT CONTRAST  IMPRESSION: 1. Stable inflammatory changes centered around the distal descending colon in the left lower abdomen. Pericolonic inflammatory changes have minimally changed since 11/27/2020. Small pocket of gas medial to the colon is probably associated with a fistula or small residual abscess collection. 2. Stable position of the two percutaneous drains. The more anterior drain may be extending through a portion of the small bowel. 3. Cholelithiasis. 4. Fibroid uterus. Cannot exclude an ovarian/adnexal cystic structure near the uterine fundus. 5. Left adrenal adenoma.   01/07/2021 Imaging   EXAM: CT ABDOMEN AND PELVIS WITH CONTRAST  IMPRESSION: 1. No new abscesses identified. Similar degree of soft tissue thickening seen in the left pericolonic region.  The degree  of persistent soft tissues thickening in the pericolonic space further raises suspicions for malignancy. Further evaluation with colonoscopy should be performed. 2. Left retroperitoneal abscess drain unchanged in position. Anterior left abdominal drain again seen terminating within small bowel loop. 3. 2.6 cm mildly sclerotic lesion noted in the L2 vertebral body. Further evaluation with contrast enhanced lumbar spine MRI should be performed.   02/04/2021 Imaging   EXAM: CT ABDOMEN AND PELVIS WITH CONTRAST  IMPRESSION: No new abdominopelvic collections or abscess development in the 1 month interval.   Left anterior drain remains within a loop of small bowel, unchanged.   Left lateral abscess drain remains in the retroperitoneal space adjacent to the iliopsoas muscle with a small amount of residual fluid and air but no measurable collection.   Stable soft tissue prominence and pericolonic strandy edema/inflammation about the left descending colon compatible with residual diverticulitis/colitis. Difficult to exclude underlying transmural lesion.   04/01/2021 Imaging   EXAM: CT ABDOMEN AND PELVIS WITH CONTRAST  IMPRESSION: There appears to be significant enhancement and wall thickening involving the descending colon with some degree of traction and involvement of adjacent small bowel loops. This is consistent with the history of diverticulitis and perforation. Stable position of percutaneous drainage catheter is seen adjacent to left psoas muscle with no significant residual fluid remaining. The other percutaneous drainage catheter that was previously noted to be within small bowel loop on prior exam in the left lower quadrant, has significantly retracted and appears to be outside of the peritoneal space at this time.   Since the prior exam, there does appear to be some degree of rotation involving mesenteric vessels and structures in the right lower quadrant, suggesting partial  volvulus or malrotation. There is the interval development of several lymph nodes in this area, most likely inflammatory or reactive in etiology. Mild amount of free fluid is also noted in the pelvis. However, no significant bowel wall thickening or dilatation is seen in this area. These results will be called to the ordering clinician or representative by the Radiologist Assistant, and communication documented in the PACS or zVision Dashboard.   Stable uterine fibroid.   Hepatic steatosis.   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   Cholelithiasis.   04/10/2021 Imaging   EXAM: CT ANGIOGRAPHY CHEST CT ABDOMEN AND PELVIS WITH CONTRAST  IMPRESSION: 1. Again seen are findings compatible with descending colon diverticulitis with perforation. Free air and inflammation have increased in the interval. 2. New lobulated enhancing fluid collection posterior to this segment of inflamed colon measuring 8.5 x 3.5 x 10.0 cm. This collection now invades the adjacent iliopsoas muscle as well as extends through the left lateral abdominal wall. The tip of the drainage catheter is in this collection. Findings are compatible with abscess. 3. Anterior left percutaneous drainage catheter tip has been pulled back and is now within the subcutaneous tissues. 4. Trace free fluid. 5. No pulmonary embolism.  No acute cardiopulmonary process. 6. Cholelithiasis. 7. Fatty infiltration of the liver.   04/15/2021 Definitive Surgery   FINAL MICROSCOPIC DIAGNOSIS:   A. SMALL BOWEL, RESECTION:  - Adenocarcinoma.  - No carcinoma identified in 1 lymph node.   B. PERFORATED LEFT COLON, RESECTION:  - Moderately differentiated colonic adenocarcinoma.  - Tumor extends into pericolonic adipose tissue, and is strongly  suggestive of invasion into small bowel.  See oncology table/comments.  - No carcinoma identified in 4 lymph nodes (0/4).  - Tubular adenoma with high-grade dysplasia, 1.  - Tubular  adenomas with low  grade dysplasia, 3.   Comments: The size of the tumor is difficult to estimate secondary to the disrupted nature of the specimen, as well as the infiltrative nature of the tumor.  Tumor can be identified as definitely invading into the pericolonic adipose tissue (block B4), but given the extreme disruption of the tissue, and the involvement of the small bowel by what appears to be a colonic adenocarcinoma, I favor perforation of the large bowel with direct invasion into the small bowel.  Accordingly, I believe this is best regarded as a pT4b lesion.   ADDENDUM:  Mismatch Repair Protein (IHC)  SUMMARY INTERPRETATION: ABNORMAL  There is loss of the major MMR protein [name]6: This indicates a high probability that a hereditary germline mutation is present and referral to genetic counseling is warranted. It is recommended that the loss of protein expression be correlated with molecular based microsatellite instability testing.   IHC EXPRESSION RESULTS  TEST           RESULT  MLH1:          Preserved nuclear expression  [name]2:          Preserved nuclear expression  [name]6:          LOSS OF NUCLEAR EXPRESSION  PMS2:          Preserved nuclear expression    04/15/2021 Cancer Staging   Staging form: Colon and Rectum, AJCC 8th Edition - Pathologic stage from 04/15/2021: Stage IIC (pT4b, pN0, cM0) - Signed by Malachy Mood, MD on 06/12/2021 Stage prefix: Initial diagnosis Total positive nodes: 0 Histologic grading system: 4 grade system Histologic grade (G): G2 Residual tumor (R): R0 - None   04/29/2021 Imaging   EXAM: CT ABDOMEN AND PELVIS WITH CONTRAST  IMPRESSION: Continued left retroperitoneal abscesses inferior to the left kidney, involving the left psoas muscle and left abdominal wall musculature. Overall size is decreased since prior study. Interval removal of left lower quadrant abscess drainage catheter.   New fluid collection in the cul-de-sac and wrapping around the uterus concerning for  abscess.   Cholelithiasis.   Small left pleural effusion.  Bibasilar atelectasis.   Stable left adrenal adenoma.  ADDENDUM: After discussing the case with Dr. Bryn Gulling in interventional radiology, it was noted that the fluid in the pelvis originally thought to be in the cul-de-sac is likely within the vagina, best seen on sagittal imaging. Recommend speculum exam.   Also, in the left lateral wall in the area of prior abscess in the lateral wall abdominal musculature, some of the abdomen soft tissue appears to be enhancing. While this could be infectious, cannot exclude tumor seeding in the left lateral abdominal wall, measuring 6.9 x 3.8 cm on image 64 of series 2.   06/16/2021 Imaging   EXAM: CT ABDOMEN AND PELVIS WITH CONTRAST  IMPRESSION: Increased size of bulky soft tissue masses involving the left psoas muscle and left lateral abdominal wall soft tissues, consistent with progressive metastatic disease.   Significant progression of multiple peritoneal masses throughout the abdomen and pelvis, consistent with peritoneal carcinomatosis.   New mild retroperitoneal lymphadenopathy, consistent with metastatic disease.   Stable uterine fibroid and left adrenal adenoma.   Cholelithiasis, without evidence of cholecystitis.   06/22/2021 - 08/19/2021 Chemotherapy   Patient is on Treatment Plan : COLORECTAL FOLFOX q14d x 6 months     06/22/2021 Tumor Marker   Patient's tumor was tested for the following markers: CEA. Results of the tumor marker test revealed  87.41.   07/10/2021 Pathology Results   FINAL MICROSCOPIC DIAGNOSIS:   A. PERITONEAL MASS, LEFT UPPER ABDOMINAL QUADRANT, BIOPSY:  -  Metastatic adenocarcinoma with necrosis, histologically consistent with colon primary.     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 The report date is 07/16/2021. The remainder of testing was negative/normal.  The  CancerNext-Expanded + RNAinsight gene panel offered by [employer] and includes sequencing and rearrangement analysis for the following 77 genes: IP, ALK, APC*, ATM*, AXIN2, BAP1, BARD1, BLM, BMPR1A, BRCA1*, BRCA2*, BRIP1*, CDC73, CDH1*,CDK4, CDKN1B, CDKN2A, CHEK2*, CTNNA1, DICER1, FANCC, FH, FLCN, GALNT12, KIF1B, LZTR1, MAX, MEN1, MET, MLH1*, [name]2*, [name]3, [name]6*, MUTYH*, NBN, NF1*, NF2, NTHL1, PALB2*, PHOX2B, PMS2*, POT1, PRKAR1A, PTCH1, PTEN*, RAD51C*, RAD51D*,RB1, RECQL, RET, SDHA, SDHAF2, SDHB, SDHC, SDHD, SMAD4, SMARCA4, SMARCB1, SMARCE1, STK11, SUFU, TMEM127, TP53*,TSC1, TSC2, VHL and XRCC2 (sequencing and deletion/duplication); EGFR, EGLN1, HOXB13, KIT, MITF, PDGFRA, POLD1 and POLE (sequencing only); EPCAM and GREM1 (deletion/duplication only).   08/28/2021 Imaging   EXAM: CT CHEST, ABDOMEN, AND PELVIS WITH CONTRAST  IMPRESSION: 1. Continued interval progression of multiple metastatic soft tissue nodules/masses in the abdomen and pelvis. 2. No evidence for metastatic disease in the chest. 3. Similar appearance of the subtle lesion in the L2 vertebral body, suspicious for metastatic involvement. 4. Cholelithiasis.   09/03/2021 -  Chemotherapy   Patient is on Treatment Plan : COLORECTAL Pembrolizumab (200) q21d     09/08/2021 - 02/01/2022 Chemotherapy   Patient is on Treatment Plan : COLORECTAL Pembrolizumab (200) q21d     01/08/2022 Imaging   CT CAP IMPRESSION: 1. Mixed response to therapy. Interval decrease in size of the conglomerate multilobulated mass extending from the left psoas into the left posterior pararenal space through the left lateral abdominal wall along the tract of the previously placed percutaneous drainage catheter. Interval decrease in size of right adnexal mass and omental masses within the right lower quadrant of the abdomen. Interval development of peritoneal carcinomatosis with omental caking within the left lower quadrant of the abdomen and left subdiaphragmatic region  adjacent to the spleen and along the left pericolic gutter as well as development of ascites appearing loculated within the anterior peritoneum. 2. Interval development of mild left hydronephrosis secondary to stricturing of the mid left ureter. 3. Stable left adrenal metastasis. 4. Stable sclerotic lesion within the L2 vertebral body. No new lytic or blastic bone lesions are seen. 5. Cholelithiasis. 6. No evidence of intrathoracic metastatic disease. 7. 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 Moderate colonic stool burden. No evidence of obstruction.   03/15/2022 Imaging   IMPRESSION: 1. Status post sigmoid colon resection with left lower quadrant end colostomy. 2. Multicystic bilateral ovarian lesions are slightly increased in size. 3. Diminished, small volume of loculated appearing ascites throughout the abdomen and pelvis, with similar appearance of peritoneal thickening and nodularity throughout. 4. Heterogeneously calcified, conglomerate masses in the left retroperitoneum and abdominal wall are slightly diminished in size. 5. Left adrenal lesion not significantly changed. 6. Unchanged faintly sclerotic metastatic lesion of L2. 7. Constellation of findings is consistent with mixed response to treatment however with overall little interval change. 8. Minimal residual left hydronephrosis and proximal hydroureter, improved compared to prior examination. 9. Cholelithiasis. 10. Coronary artery disease, significantly advanced for patient age.   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)     Interval History:   Next Rande Lawman tomorrow  Past Medical/Surgical History: Past Medical History:  Diagnosis Date   Allergy    seasonal   Anemia     Blood infection 1985   Blood transfusion without reported diagnosis    Diverticulitis    Family history of breast cancer    Family history of colon cancer    Family history of stomach cancer    Hypertension    Obesity    Sleep apnea     Past Surgical History:  Procedure Laterality Date   COLECTOMY WITH COLOSTOMY CREATION/HARTMANN PROCEDURE N/A 04/15/2021   Procedure: COLOSTOMY CREATION/HARTMANN PROCEDURE;  Surgeon: [name], MD;  Location: [facility];  Service: General;  Laterality: N/A;   IR CATHETER TUBE CHANGE  12/12/2020   IR CATHETER TUBE CHANGE  01/27/2021   IR CATHETER TUBE CHANGE  02/19/2021   IR CATHETER TUBE CHANGE  04/13/2021   IR IMAGING GUIDED PORT INSERTION  10/02/2021   IR RADIOLOGIST EVAL & MGMT  11/27/2020   IR RADIOLOGIST EVAL & MGMT  12/11/2020   IR RADIOLOGIST EVAL & MGMT  01/07/2021   IR RADIOLOGIST EVAL & MGMT  02/04/2021   IR SINUS/FIST TUBE CHK-NON GI  12/12/2020   IR SINUS/FIST TUBE CHK-NON GI  02/19/2021   LAPAROSCOPIC PARTIAL COLECTOMY N/A 04/15/2021   Procedure: LAPAROSCOPIC ASSISTED HARTMANN RESECTION;  Surgeon: [name], MD;  Location: [facility];  Service: General;  Laterality: N/A;    Family History  Problem Relation Age of Onset   Diabetes Mother    Hypertension Mother    Colon cancer Mother        dx at age 20   Breast cancer Mother    Congestive Heart Failure Father    Hypertension Father    Diabetes Maternal Grandmother    Hypertension Maternal Grandmother    Diabetes Maternal Grandfather    Kidney disease Maternal Grandfather    Diabetes Paternal Grandmother    Autism Son    Stomach cancer Maternal Uncle    Heart disease Paternal Aunt    Colon polyps Half-Sister        colectomy   Ovarian cancer Neg Hx    Endometrial cancer Neg Hx    Pancreatic cancer Neg Hx    Prostate cancer Neg Hx     Social History   Socioeconomic History   Marital status: Married    Spouse name: Errol   Number of children: 3   Years of education: Not  on file   Highest education level: Not on file  Occupational History   Not on file  Tobacco Use   Smoking status: Never   Smokeless tobacco: Never  Vaping Use   Vaping Use: Never used  Substance and Sexual Activity   Alcohol use: Not Currently    Comment: Occ   Drug use: No   Sexual activity: Yes    Partners: Male    Birth control/protection: None  Other Topics Concern   Not on file  Social History Narrative   Not on file   Social Determinants of Health   Financial Resource Strain: High Risk (05/26/2021)   Overall Financial Resource Strain (CARDIA)    Difficulty of Paying Living Expenses: Hard  Food Insecurity: Food Insecurity Present (05/26/2021)   Hunger Vital Sign    Worried About Running Out of Food in the Last Year: Sometimes true  Ran Out of Food in the Last Year: Sometimes true  Transportation Needs: No Transportation Needs (05/26/2021)   PRAPARE - Administrator, Civil Service (Medical): No    Lack of Transportation (Non-Medical): No  Physical Activity: Not on file  Stress: Stress Concern Present (05/26/2021)   [employer] of Occupational Health - Occupational Stress Questionnaire    Feeling of Stress : Very much  Social Connections: Moderately Integrated (05/26/2021)   Social Connection and Isolation Panel [NHANES]    Frequency of Communication with Friends and Family: More than three times a week    Frequency of Social Gatherings with Friends and Family: More than three times a week    Attends Religious Services: More than 4 times per year    Active Member of [employer] or Organizations: No    Attends Banker Meetings: Never    Marital Status: Married    Current Medications:  Current Outpatient Medications:    ALPRAZolam (XANAX) 0.25 MG tablet, Take 1 tablet (0.25 mg total) by mouth at bedtime as needed for anxiety., Disp: 30 tablet, Rfl: 0   amLODipine (NORVASC) 10 MG tablet, TAKE 1 TABLET BY MOUTH EVERY DAY, Disp: 90 tablet,  Rfl: 3   DULoxetine (CYMBALTA) 60 MG capsule, TAKE 1 CAPSULE BY MOUTH EVERY DAY, Disp: 90 capsule, Rfl: 2   lidocaine-prilocaine (EMLA) cream, Apply 1 Application topically as needed., Disp: 30 g, Rfl: 0   metoprolol tartrate 75 MG TABS, Take 75 mg by mouth 2 (two) times daily., Disp: 90 tablet, Rfl: 3   oxyCODONE-acetaminophen (PERCOCET) 7.5-325 MG tablet, Take 1 tablet by mouth every 8 (eight) hours as needed for severe pain., Disp: 30 tablet, Rfl: 0   POTASSIUM PO, Take by mouth., Disp: , Rfl:  No current facility-administered medications for this visit.  Facility-Administered Medications Ordered in Other Visits:    fentaNYL (SUBLIMAZE) injection, , Intravenous, PRN, Mir, Al Corpus, MD, 50 mcg at 10/02/21 1436   midazolam (VERSED) injection, , Intravenous, PRN, Mir, Al Corpus, MD, 1 mg at 10/02/21 1436  Review of Symptoms: Complete 10-system review is negative except ***as above in Interval History.  Physical Exam: There were no vitals taken for this visit. General: ***Alert, oriented, no acute distress. HEENT: ***Normocephalic, atraumatic. Neck symmetric without masses. Sclera anicteric. ***Posterior oropharynx clear. Chest: ***Normal work of breathing. ***Clear to auscultation bilaterally.  ***Port site clean. Cardiovascular: ***Regular rate and rhythm, no murmurs. Abdomen: ***Soft, nontender.  Normoactive bowel sounds.  No masses or hepatosplenomegaly appreciated.  ***Well-healed scar. Extremities: ***Grossly normal range of motion.  Warm, well perfused.  No edema bilaterally. Skin: ***No rashes or lesions noted. Lymphatics: ***No cervical, supraclavicular, or inguinal adenopathy. GU: Normal appearing external genitalia without erythema, excoriation, or lesions.  Speculum exam reveals ***.  Bimanual exam reveals ***.  ***Rectovaginal exam ***. Exam chaperoned by ***  Laboratory & Radiologic Studies: ***

## 2022-04-27 ENCOUNTER — Encounter: Payer: Self-pay | Admitting: Hematology

## 2022-04-27 ENCOUNTER — Inpatient Hospital Stay: Payer: BC Managed Care – PPO

## 2022-04-27 ENCOUNTER — Encounter: Payer: Self-pay | Admitting: Psychiatry

## 2022-04-27 ENCOUNTER — Inpatient Hospital Stay (HOSPITAL_BASED_OUTPATIENT_CLINIC_OR_DEPARTMENT_OTHER): Payer: BC Managed Care – PPO | Admitting: Hematology

## 2022-04-27 VITALS — BP 145/87 | HR 84 | Temp 99.1°F | Resp 18 | Ht 63.0 in | Wt 210.0 lb

## 2022-04-27 VITALS — BP 147/93 | HR 84 | Temp 98.4°F | Resp 20

## 2022-04-27 DIAGNOSIS — C187 Malignant neoplasm of sigmoid colon: Secondary | ICD-10-CM

## 2022-04-27 DIAGNOSIS — D398 Neoplasm of uncertain behavior of other specified female genital organs: Secondary | ICD-10-CM | POA: Diagnosis not present

## 2022-04-27 DIAGNOSIS — C786 Secondary malignant neoplasm of retroperitoneum and peritoneum: Secondary | ICD-10-CM | POA: Diagnosis not present

## 2022-04-27 DIAGNOSIS — I10 Essential (primary) hypertension: Secondary | ICD-10-CM | POA: Diagnosis not present

## 2022-04-27 DIAGNOSIS — D500 Iron deficiency anemia secondary to blood loss (chronic): Secondary | ICD-10-CM

## 2022-04-27 DIAGNOSIS — Z452 Encounter for adjustment and management of vascular access device: Secondary | ICD-10-CM

## 2022-04-27 DIAGNOSIS — D649 Anemia, unspecified: Secondary | ICD-10-CM

## 2022-04-27 DIAGNOSIS — D1391 Familial adenomatous polyposis: Secondary | ICD-10-CM | POA: Diagnosis not present

## 2022-04-27 DIAGNOSIS — Z5112 Encounter for antineoplastic immunotherapy: Secondary | ICD-10-CM | POA: Diagnosis not present

## 2022-04-27 DIAGNOSIS — Z6841 Body Mass Index (BMI) 40.0 and over, adult: Secondary | ICD-10-CM

## 2022-04-27 LAB — CMP (CANCER CENTER ONLY)
ALT: 14 U/L (ref 0–44)
AST: 14 U/L — ABNORMAL LOW (ref 15–41)
Albumin: 4.1 g/dL (ref 3.5–5.0)
Alkaline Phosphatase: 105 U/L (ref 38–126)
Anion gap: 7 (ref 5–15)
BUN: 18 mg/dL (ref 6–20)
CO2: 22 mmol/L (ref 22–32)
Calcium: 9.2 mg/dL (ref 8.9–10.3)
Chloride: 108 mmol/L (ref 98–111)
Creatinine: 0.61 mg/dL (ref 0.44–1.00)
GFR, Estimated: >60 mL/min (ref >60)
Glucose, Bld: 92 mg/dL (ref 70–99)
Potassium: 3.9 mmol/L (ref 3.5–5.1)
Sodium: 137 mmol/L (ref 135–145)
Total Bilirubin: 0.3 mg/dL (ref 0.3–1.2)
Total Protein: 7.4 g/dL (ref 6.5–8.1)

## 2022-04-27 LAB — IRON AND IRON BINDING CAPACITY (CC-WL,HP ONLY)
Iron: 70 ug/dL (ref 28–170)
Saturation Ratios: 22 % (ref 10.4–31.8)
TIBC: 326 ug/dL (ref 250–450)
UIBC: 256 ug/dL (ref 148–442)

## 2022-04-27 LAB — CBC WITH DIFFERENTIAL (CANCER CENTER ONLY)
Abs Immature Granulocytes: 0 10*3/uL (ref 0.00–0.07)
Basophils Absolute: 0 10*3/uL (ref 0.0–0.1)
Basophils Relative: 1 %
Eosinophils Absolute: 0.1 10*3/uL (ref 0.0–0.5)
Eosinophils Relative: 2 %
HCT: 32.4 % — ABNORMAL LOW (ref 36.0–46.0)
Hemoglobin: 10.4 g/dL — ABNORMAL LOW (ref 12.0–15.0)
Immature Granulocytes: 0 %
Lymphocytes Relative: 42 %
Lymphs Abs: 1.7 10*3/uL (ref 0.7–4.0)
MCH: 26.4 pg (ref 26.0–34.0)
MCHC: 32.1 g/dL (ref 30.0–36.0)
MCV: 82.2 fL (ref 80.0–100.0)
Monocytes Absolute: 0.2 10*3/uL (ref 0.1–1.0)
Monocytes Relative: 6 %
Neutro Abs: 2.1 10*3/uL (ref 1.7–7.7)
Neutrophils Relative %: 49 %
Platelet Count: 355 10*3/uL (ref 150–400)
RBC: 3.94 MIL/uL (ref 3.87–5.11)
RDW: 16.6 % — ABNORMAL HIGH (ref 11.5–15.5)
WBC Count: 4.1 10*3/uL (ref 4.0–10.5)
nRBC: 0 % (ref 0.0–0.2)

## 2022-04-27 LAB — FERRITIN: Ferritin: 27 ng/mL (ref 11–307)

## 2022-04-27 LAB — PREGNANCY, URINE: Preg Test, Ur: NEGATIVE

## 2022-04-27 LAB — CEA (IN HOUSE-CHCC): CEA (CHCC-In House): <1 ng/mL (ref 0.00–5.00)

## 2022-04-27 MED ORDER — HEPARIN SOD (PORK) LOCK FLUSH 100 UNIT/ML IV SOLN
500.0000 [IU] | Freq: Once | INTRAVENOUS | Status: AC | PRN
  Administered 2022-04-27: 500 [IU]

## 2022-04-27 MED ORDER — SODIUM CHLORIDE 0.9% FLUSH
10.0000 mL | INTRAVENOUS | Status: DC | PRN
  Administered 2022-04-27: 10 mL

## 2022-04-27 MED ORDER — SODIUM CHLORIDE 0.9 % IV SOLN
200.0000 mg | Freq: Once | INTRAVENOUS | Status: AC
  Administered 2022-04-27: 200 mg via INTRAVENOUS
  Filled 2022-04-27: qty 200

## 2022-04-27 MED ORDER — SODIUM CHLORIDE 0.9 % IV SOLN: Freq: Once | INTRAVENOUS | Status: AC

## 2022-04-27 MED ORDER — SODIUM CHLORIDE 0.9% FLUSH
10.0000 mL | Freq: Once | INTRAVENOUS | Status: AC
  Administered 2022-04-27: 10 mL

## 2022-04-27 NOTE — Progress Notes (Signed)
Gynecologic Oncology Telehealth Follow-Up Note  I connected with Mikayla Rios on 04/27/22 at  4:00 PM EST by telephone and verified that I am speaking with the correct person using two identifiers.  I discussed the limitations, risks, security and privacy concerns of performing an evaluation and management service by telemedicine and the availability of in-person appointments. I also discussed with the patient that there may be a patient responsible charge related to this service. The patient expressed understanding and agreed to proceed.  Other persons participating in the visit and their role in the encounter: none.  Patient's location: Home; [place] Provider's location: [facility]  Date of Service: 04/26/2022 Referring Provider: [name], MD  Assessment & Plan: Mikayla Rios is a 36 y.o. 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  Adnexal masses: - Reviewed MRI and PET scan results in detail. - PET with multiple findings including some persistent hypermetabolic activity in the left retroperitoneal mass; omental nodule with low-grade activity; hypermetabolic nodule in the gallbladder; some hypermetabolic activity along the margins of the cystic adnexal masses; focal hypermetabolic activity along the anterior uterus possible serosal deposit. - Reviewed again that the primary differential for the bilateral adnexal masses includes metastatic colon cancer to the ovaries, secondary ovarian malignancy, or benign tubo-ovarian complexes secondary to her extensive surgical infection history. - Reviewed negative tumor markers including a normalized CEA and a normal CA125. - Reviewed that although the cystic masses have some peripheral activity, this activity appears to be less than other locations that were largely concerning for colon cancer including her left retroperitoneal mass.  Also, the retroperitoneal mass responded in size well to [facility] treatment although does still  have some hypermetabolic activity on PET. -Overall, her findings are indeterminate for simply a mixed response to [facility], possible ongoing response to [place], versus, albeit less so, a secondary malignancy. Primary ovarian malignancy's don't typically respond to single agent immunotherapy, so the lower activity of the adnexal masses compared to some other areas suggests to me that a secondary ovarian malignancy is less likely, although not completely out of the question. -Discussed with patient that additional biopsies could be considered.  We will review her case at our tumor board next week to try to gain further consensus on next steps and will discuss further with patient. - I will update her medical oncologist, Dr. Burr Medico, in case she is available/would like to attend the tumor board discussion.  RTC pending tumor board discussion.  Bernadene Bell, MD Gynecologic Oncology   Medical Decision Making I personally spent  TOTAL 28 minutes face-to-face and non-face-to-face in the care of this patient, which includes all pre, intra, and post visit time on the date of service.  3 minutes spent reviewing records prior to the visit 20 Minutes in patient contact 5 minutes charting , conferring with consultants etc.   ----------------------- Reason for Visit: Imaging Follow-up, Treatment Planning  Treatment History: Oncology History Overview Note   Cancer Staging  Malignant neoplasm of sigmoid colon [facility]) Staging form: Colon and Rectum, AJCC 8th Edition - Pathologic stage from 04/15/2021: Stage IIC (pT4b, pN0, cM0) - Signed by [name], MD on 06/12/2021    Malignant neoplasm of sigmoid colon [facility])   Initial Diagnosis   Malignant neoplasm of sigmoid colon ([place])   11/04/2020 Imaging   EXAM: CT ABDOMEN AND PELVIS WITHOUT CONTRAST  IMPRESSION: 1. Perforating descending colonic diverticulitis with multiple abdominopelvic gas and fluid collections, measuring up to 5.7 cm and further  described  above. 2. Cholelithiasis without findings of acute cholecystitis. 3. 3.6 cm benign left adrenal adenoma. 4. Leiomyomatous uterus. 5. 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   11/07/2020 Imaging   EXAM: CT ABDOMEN AND PELVIS WITHOUT CONTRAST  IMPRESSION: Continued wall thickening is seen involving descending colon suggesting infectious or inflammatory colitis or perforated diverticulitis. There is an adjacent fluid collection measuring 7.0 x 5.0 cm consistent with abscess which is significantly enlarged compared to prior exam. Adjacent to the abscess, there is a severely thickened small bowel loop most consistent with secondary inflammation.   5.2 x 3.5 cm fluid collection is noted within the left psoas muscle consistent with abscess which is significantly enlarged compared to prior exam. 4.4 x 3.8 cm fluid collection consistent with abscess is noted in the left retroperitoneal region which is also enlarged compared to prior exam.   Fibroid uterus.   Cholelithiasis.   11/27/2020 Imaging   EXAM: CT ABDOMEN AND PELVIS WITHOUT CONTRAST  IMPRESSION: 1. Significantly interval decreased size of the previously visualized left retroperitoneal and left anterior mid abdominal fluid collections. There is persistent fat stranding and mural thickening of the mid descending colon at this level. 2. Unchanged leiomyomatous uterus. 3. Unchanged cholelithiasis.   12/11/2020 Imaging   EXAM: CT ABDOMEN AND PELVIS WITHOUT CONTRAST  IMPRESSION: 1. Stable inflammatory changes centered around the distal descending colon in the left lower abdomen. Pericolonic inflammatory changes have minimally changed since 11/27/2020. Small pocket of gas medial to the colon is probably associated with a fistula or small residual abscess collection. 2. Stable position of the two percutaneous drains. The more  anterior drain may be extending through a portion of the small bowel. 3. Cholelithiasis. 4. Fibroid uterus. Cannot exclude an ovarian/adnexal cystic structure near the uterine fundus. 5. Left adrenal adenoma.   01/07/2021 Imaging   EXAM: CT ABDOMEN AND PELVIS WITH CONTRAST  IMPRESSION: 1. No new abscesses identified. Similar degree of soft tissue thickening seen in the left pericolonic region. The degree of persistent soft tissues thickening in the pericolonic space further raises suspicions for malignancy. Further evaluation with colonoscopy should be performed. 2. Left retroperitoneal abscess drain unchanged in position. Anterior left abdominal drain again seen terminating within small bowel loop. 3. 2.6 cm mildly sclerotic lesion noted in the L2 vertebral body. Further evaluation with contrast enhanced lumbar spine MRI should be performed.   02/04/2021 Imaging   EXAM: CT ABDOMEN AND PELVIS WITH CONTRAST  IMPRESSION: No new abdominopelvic collections or abscess development in the 1 month interval.   Left anterior drain remains within a loop of small bowel, unchanged.   Left lateral abscess drain remains in the retroperitoneal space adjacent to the iliopsoas muscle with a small amount of residual fluid and air but no measurable collection.   Stable soft tissue prominence and pericolonic strandy edema/inflammation about the left descending colon compatible with residual diverticulitis/colitis. Difficult to exclude underlying transmural lesion.   04/01/2021 Imaging   EXAM: CT ABDOMEN AND PELVIS WITH CONTRAST  IMPRESSION: There appears to be significant enhancement and wall thickening involving the descending colon with some degree of traction and involvement of adjacent small bowel loops. This is consistent with the history of diverticulitis and perforation. Stable position of percutaneous drainage catheter is seen adjacent to left psoas muscle with no significant residual  fluid remaining. The other percutaneous drainage catheter that was previously noted to be within small bowel loop on prior  exam in the left lower quadrant, has significantly retracted and appears to be outside of the peritoneal space at this time.   Since the prior exam, there does appear to be some degree of rotation involving mesenteric vessels and structures in the right lower quadrant, suggesting partial volvulus or malrotation. There is the interval development of several lymph nodes in this area, most likely inflammatory or reactive in etiology. Mild amount of free fluid is also noted in the pelvis. However, no significant bowel wall thickening or dilatation is seen in this area. These results will be called to the ordering clinician or representative by the Radiologist Assistant, and communication documented in the PACS or zVision Dashboard.   Stable uterine fibroid.   Hepatic steatosis.   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   Cholelithiasis.   04/10/2021 Imaging   EXAM: CT ANGIOGRAPHY CHEST CT ABDOMEN AND PELVIS WITH CONTRAST  IMPRESSION: 1. Again seen are findings compatible with descending colon diverticulitis with perforation. Free air and inflammation have increased in the interval. 2. New lobulated enhancing fluid collection posterior to this segment of inflamed colon measuring 8.5 x 3.5 x 10.0 cm. This collection now invades the adjacent iliopsoas muscle as well as extends through the left lateral abdominal wall. The tip of the drainage catheter is in this collection. Findings are compatible with abscess. 3. Anterior left percutaneous drainage catheter tip has been pulled back and is now within the subcutaneous tissues. 4. Trace free fluid. 5. No pulmonary embolism.  No acute cardiopulmonary process. 6. Cholelithiasis. 7. Fatty infiltration of the liver.   04/15/2021 Definitive Surgery   FINAL MICROSCOPIC DIAGNOSIS:   A. SMALL BOWEL, RESECTION:  -  Adenocarcinoma.  - No carcinoma identified in 1 lymph node.   B. PERFORATED LEFT COLON, RESECTION:  - Moderately differentiated colonic adenocarcinoma.  - Tumor extends into pericolonic adipose tissue, and is strongly  suggestive of invasion into small bowel.  See oncology table/comments.  - No carcinoma identified in 4 lymph nodes (0/4).  - Tubular adenoma with high-grade dysplasia, 1.  - Tubular adenomas with low grade dysplasia, 3.   Comments: The size of the tumor is difficult to estimate secondary to the disrupted nature of the specimen, as well as the infiltrative nature of the tumor.  Tumor can be identified as definitely invading into the pericolonic adipose tissue (block B4), but given the extreme disruption of the tissue, and the involvement of the small bowel by what appears to be a colonic adenocarcinoma, I favor perforation of the large bowel with direct invasion into the small bowel.  Accordingly, I believe this is best regarded as a pT4b lesion.   ADDENDUM:  Mismatch Repair Protein (IHC)  SUMMARY INTERPRETATION: ABNORMAL  There is loss of the major MMR protein [name]6: This indicates a high probability that a hereditary germline mutation is present and referral to genetic counseling is warranted. It is recommended that the loss of protein expression be correlated with molecular based microsatellite instability testing.   IHC EXPRESSION RESULTS  TEST           RESULT  MLH1:          Preserved nuclear expression  [name]2:          Preserved nuclear expression  [name]6:          LOSS OF NUCLEAR EXPRESSION  PMS2:          Preserved nuclear expression    04/15/2021 Cancer Staging   Staging form: Colon and Rectum, AJCC  8th Edition - Pathologic stage from 04/15/2021: Stage IIC (pT4b, pN0, cM0) - Signed by [name], MD on 06/12/2021 Stage prefix: Initial diagnosis Total positive nodes: 0 Histologic grading system: 4 grade system Histologic grade (G): G2 Residual tumor (R): R0 - None    04/29/2021 Imaging   EXAM: CT ABDOMEN AND PELVIS WITH CONTRAST  IMPRESSION: Continued left retroperitoneal abscesses inferior to the left kidney, involving the left psoas muscle and left abdominal wall musculature. Overall size is decreased since prior study. Interval removal of left lower quadrant abscess drainage catheter.   New fluid collection in the cul-de-sac and wrapping around the uterus concerning for abscess.   Cholelithiasis.   Small left pleural effusion.  Bibasilar atelectasis.   Stable left adrenal adenoma.  ADDENDUM: After discussing the case with Dr. Dwaine Gale in interventional radiology, it was noted that the fluid in the pelvis originally thought to be in the cul-de-sac is likely within the vagina, best seen on sagittal imaging. Recommend speculum exam.   Also, in the left lateral wall in the area of prior abscess in the lateral wall abdominal musculature, some of the abdomen soft tissue appears to be enhancing. While this could be infectious, cannot exclude tumor seeding in the left lateral abdominal wall, measuring 6.9 x 3.8 cm on image 64 of series 2.   06/16/2021 Imaging   EXAM: CT ABDOMEN AND PELVIS WITH CONTRAST  IMPRESSION: Increased size of bulky soft tissue masses involving the left psoas muscle and left lateral abdominal wall soft tissues, consistent with progressive metastatic disease.   Significant progression of multiple peritoneal masses throughout the abdomen and pelvis, consistent with peritoneal carcinomatosis.   New mild retroperitoneal lymphadenopathy, consistent with metastatic disease.   Stable uterine fibroid and left adrenal adenoma.   Cholelithiasis, without evidence of cholecystitis.   06/22/2021 - 08/19/2021 Chemotherapy   Patient is on Treatment Plan : COLORECTAL FOLFOX q14d x 6 months     06/22/2021 Tumor Marker   Patient's tumor was tested for the following markers: CEA. Results of the tumor marker test revealed 87.41.    07/10/2021 Pathology Results   FINAL MICROSCOPIC DIAGNOSIS:   A. PERITONEAL MASS, LEFT UPPER ABDOMINAL QUADRANT, BIOPSY:  -  Metastatic adenocarcinoma with necrosis, histologically consistent with colon primary.     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 The report date is 07/16/2021. The remainder of testing was negative/normal.  The CancerNext-Expanded + RNAinsight gene panel offered by [employer] and includes sequencing and rearrangement analysis for the following 77 genes: IP, ALK, APC*, ATM*, AXIN2, BAP1, BARD1, BLM, BMPR1A, BRCA1*, BRCA2*, BRIP1*, CDC73, CDH1*,CDK4, CDKN1B, CDKN2A, CHEK2*, CTNNA1, DICER1, FANCC, FH, FLCN, GALNT12, KIF1B, LZTR1, MAX, MEN1, MET, MLH1*, [name]2*, [name]3, [name]6*, MUTYH*, NBN, NF1*, NF2, NTHL1, PALB2*, PHOX2B, PMS2*, POT1, PRKAR1A, PTCH1, PTEN*, RAD51C*, RAD51D*,RB1, RECQL, RET, SDHA, SDHAF2, SDHB, SDHC, SDHD, SMAD4, SMARCA4, SMARCB1, SMARCE1, STK11, SUFU, TMEM127, TP53*,TSC1, TSC2, VHL and XRCC2 (sequencing and deletion/duplication); EGFR, EGLN1, HOXB13, KIT, MITF, PDGFRA, POLD1 and POLE (sequencing only); EPCAM and GREM1 (deletion/duplication only).   08/28/2021 Imaging   EXAM: CT CHEST, ABDOMEN, AND PELVIS WITH CONTRAST  IMPRESSION: 1. Continued interval progression of multiple metastatic soft tissue nodules/masses in the abdomen and pelvis. 2. No evidence for metastatic disease in the chest. 3. Similar appearance of the subtle lesion in the L2 vertebral body, suspicious for metastatic involvement. 4. Cholelithiasis.   09/03/2021 -  Chemotherapy   Patient is on Treatment Plan : COLORECTAL Pembrolizumab (200) q21d  09/08/2021 - 02/01/2022 Chemotherapy   Patient is on Treatment Plan : COLORECTAL Pembrolizumab (200) q21d     01/08/2022 Imaging   CT CAP IMPRESSION: 1. Mixed response to therapy. Interval decrease in size of the conglomerate multilobulated mass extending from the left  psoas into the left posterior pararenal space through the left lateral abdominal wall along the tract of the previously placed percutaneous drainage catheter. Interval decrease in size of right adnexal mass and omental masses within the right lower quadrant of the abdomen. Interval development of peritoneal carcinomatosis with omental caking within the left lower quadrant of the abdomen and left subdiaphragmatic region adjacent to the spleen and along the left pericolic gutter as well as development of ascites appearing loculated within the anterior peritoneum. 2. Interval development of mild left hydronephrosis secondary to stricturing of the mid left ureter. 3. Stable left adrenal metastasis. 4. Stable sclerotic lesion within the L2 vertebral body. No new lytic or blastic bone lesions are seen. 5. Cholelithiasis. 6. No evidence of intrathoracic metastatic disease. 7. 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 Moderate colonic stool burden. No evidence of obstruction.   03/15/2022 Imaging   IMPRESSION: 1. Status post sigmoid colon resection with left lower quadrant end colostomy. 2. Multicystic bilateral ovarian lesions are slightly increased in size. 3. Diminished, small volume of loculated appearing ascites throughout the abdomen and pelvis, with similar appearance of peritoneal thickening and nodularity throughout. 4. Heterogeneously calcified, conglomerate masses in the left retroperitoneum and abdominal wall are slightly diminished in size. 5. Left adrenal lesion not significantly changed. 6. Unchanged faintly sclerotic metastatic lesion of L2. 7. Constellation of findings is consistent with mixed response to treatment however with overall little interval change. 8. Minimal residual left hydronephrosis and proximal hydroureter, improved compared to prior examination. 9. Cholelithiasis. 10. Coronary artery disease,  significantly advanced for patient age.   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 Continues to tolerate [place]. Next cycle is tomorrow.   Past Medical/Surgical History: Past Medical History:  Diagnosis Date   Allergy    seasonal   Anemia    Blood infection 1985   Blood transfusion without reported diagnosis    Diverticulitis    Family history of breast cancer    Family history of colon cancer    Family history of stomach cancer    Hypertension    Obesity    Sleep apnea     Past Surgical History:  Procedure Laterality Date   COLECTOMY WITH COLOSTOMY CREATION/HARTMANN PROCEDURE N/A 04/15/2021   Procedure: COLOSTOMY CREATION/HARTMANN PROCEDURE;  Surgeon: [name], MD;  Location: [facility];  Service: General;  Laterality: N/A;   IR CATHETER TUBE CHANGE  12/12/2020   IR CATHETER TUBE CHANGE  01/27/2021   IR CATHETER TUBE CHANGE  02/19/2021   IR CATHETER TUBE CHANGE  04/13/2021   IR IMAGING GUIDED PORT INSERTION  10/02/2021   IR RADIOLOGIST EVAL & MGMT  11/27/2020   IR RADIOLOGIST EVAL & MGMT  12/11/2020   IR RADIOLOGIST EVAL & MGMT  01/07/2021   IR RADIOLOGIST EVAL & MGMT  02/04/2021   IR SINUS/FIST TUBE CHK-NON GI  12/12/2020   IR SINUS/FIST TUBE CHK-NON GI  02/19/2021   LAPAROSCOPIC PARTIAL COLECTOMY N/A 04/15/2021  Procedure: LAPAROSCOPIC ASSISTED HARTMANN RESECTION;  Surgeon: [name], MD;  Location: [facility];  Service: General;  Laterality: N/A;    Family History  Problem Relation Age of Onset   Diabetes Mother    Hypertension Mother    Colon cancer Mother        dx at age 41   Breast cancer Mother    Congestive Heart Failure Father    Hypertension Father    Diabetes Maternal Grandmother    Hypertension Maternal Grandmother    Diabetes Maternal  Grandfather    Kidney disease Maternal Grandfather    Diabetes Paternal Grandmother    Autism Son    Stomach cancer Maternal Uncle    Heart disease Paternal Aunt    Colon polyps Half-Sister        colectomy   Ovarian cancer Neg Hx    Endometrial cancer Neg Hx    Pancreatic cancer Neg Hx    Prostate cancer Neg Hx     Social History   Socioeconomic History   Marital status: Married    Spouse name: Errol   Number of children: 3   Years of education: Not on file   Highest education level: Not on file  Occupational History   Not on file  Tobacco Use   Smoking status: Never   Smokeless tobacco: Never  Vaping Use   Vaping Use: Never used  Substance and Sexual Activity   Alcohol use: Not Currently    Comment: Occ   Drug use: No   Sexual activity: Yes    Partners: Male    Birth control/protection: None  Other Topics Concern   Not on file  Social History Narrative   Not on file   Social Determinants of Health   Financial Resource Strain: High Risk (05/26/2021)   Overall Financial Resource Strain (CARDIA)    Difficulty of Paying Living Expenses: Hard  Food Insecurity: Food Insecurity Present (05/26/2021)   Hunger Vital Sign    Worried About Running Out of Food in the Last Year: Sometimes true    Ran Out of Food in the Last Year: Sometimes true  Transportation Needs: No Transportation Needs (05/26/2021)   PRAPARE - Hydrologist (Medical): No    Lack of Transportation (Non-Medical): No  Physical Activity: Not on file  Stress: Stress Concern Present (05/26/2021)   [place]    Feeling of Stress : Very much  Social Connections: Moderately Integrated (05/26/2021)   Social Connection and Isolation Panel [NHANES]    Frequency of Communication with Friends and Family: More than three times a week    Frequency of Social Gatherings with Friends and Family: More than three times a  week    Attends Religious Services: More than 4 times per year    Active Member of [employer] or Organizations: No    Attends Archivist Meetings: Never    Marital Status: Married    Current Medications:  Current Outpatient Medications:    ALPRAZolam (XANAX) 0.25 MG tablet, Take 1 tablet (0.25 mg total) by mouth at bedtime as needed for anxiety., Disp: 30 tablet, Rfl: 0   amLODipine (NORVASC) 10 MG tablet, TAKE 1 TABLET BY MOUTH EVERY DAY, Disp: 90 tablet, Rfl: 3   DULoxetine (CYMBALTA) 60 MG capsule, TAKE 1 CAPSULE BY MOUTH EVERY DAY, Disp: 90 capsule, Rfl: 2   lidocaine-prilocaine (EMLA) cream, Apply 1 Application topically as needed., Disp: 30 g, Rfl: 0  metoprolol tartrate 75 MG TABS, Take 75 mg by mouth 2 (two) times daily., Disp: 90 tablet, Rfl: 3   oxyCODONE-acetaminophen (PERCOCET) 7.5-325 MG tablet, Take 1 tablet by mouth every 8 (eight) hours as needed for severe pain., Disp: 30 tablet, Rfl: 0   POTASSIUM PO, Take by mouth., Disp: , Rfl:  No current facility-administered medications for this visit.  Facility-Administered Medications Ordered in Other Visits:    fentaNYL (SUBLIMAZE) injection, , Intravenous, PRN, Mir, Paula Libra, MD, 50 mcg at 10/02/21 1436   midazolam (VERSED) injection, , Intravenous, PRN, Mir, Paula Libra, MD, 1 mg at 10/02/21 1436   sodium chloride flush (NS) 0.9 % injection 10 mL, 10 mL, Intracatheter, PRN, [name], MD, 10 mL at 04/27/22 1223  Review of Symptoms: Pertinent positives as per HPI.  Physical Exam: Deferred given limitations of phone visit.  Laboratory & Radiologic Studies: NM PET Image Initial (PI) Skull Base To Thigh (F-18 FDG) 04/26/2022  Narrative CLINICAL DATA:  Subsequent treatment strategy for metastatic sigmoid colon cancer.  EXAM: NUCLEAR MEDICINE PET SKULL BASE TO THIGH  TECHNIQUE: 10.1 mCi F-18 FDG was injected intravenously. Full-ring PET imaging was performed from the skull base to thigh after the radiotracer.  CT data was obtained and used for attenuation correction and anatomic localization.  Fasting blood glucose: Ninety-three mg/dl  COMPARISON:  Multiple exams, including MRI pelvis 04/08/2022 and CT scan 03/15/2022  FINDINGS: Mediastinal blood pool activity: SUV max 1.7  Liver activity: SUV max NA  NECK: Symmetric accentuated uptake along the palatine tonsillar regions, maximum SUV 8.2 on the left and 7.1 on the right. Likely physiologic given the lack of significant CT correlate.  Incidental CT findings: None.  CHEST: Faintly accentuated activity in the vicinity of suspected thymic tissue based on morphologic findings on CT, maximum SUV 4.3, mild increase in prominence compared to 03/15/2022, query thymic rebound.  Incidental CT findings: Right Port-A-Cath tip: Lower SVC. Left anterior descending coronary artery atherosclerosis and mild cardiomegaly.  ABDOMEN/PELVIS: Abnormal in calcified left retroperitoneal mass extending through the lateral abdominal wall musculature to the cutaneous surface with associated speckled calcifications as described on prior CT, maximum SUV 5.3. This extends across to the psoas muscle.  Faintly calcified left omental nodules demonstrate mostly low-grade metabolic activity, maximum SUV 3.2 in the vicinity of image 16 series 4.  Intraluminal nodule along the left wall of the gallbladder has maximum SUV of 4.9, suspicious for gallbladder polyp, cancer, or metastatic lesion. Small gallstones are also present.  The known septated left ovarian cystic lesion has some hypermetabolic activity along its lower inferior margin with the uterus, maximum SUV 3.9. Cannot exclude a malignant nodule.  Similarly the right cystic ovarian mass has some calcification along its left posterior margin with maximum SUV of about 3.2, ovarian malignancy is not excluded on the right.  Uterine fibroids noted. There is a faint caller calcification along the anterior  margin of the uterine body and lower uterine segment but above the urinary bladder with some associated hypermetabolic activity, cannot exclude deposition of malignancy in this vicinity, maximum SUV 6.2.  Scattered hypermetabolic activity in the bowel, probably physiologic. There is some focal activity in the rectum, maximum SUV 8.4, nonspecific.  There is some other small calcified omental nodules, for example near the stomach on image 99 of series 4, without measurable hypermetabolic activity  Incidental CT findings: Left colostomy.  Simple appearing left kidney lower pole cyst. No further imaging workup of this lesion is indicated.  Postoperative findings in  small bowel in the right abdomen. Uterine fibroids.  SKELETON: No significant abnormal hypermetabolic activity in this region.  Incidental CT findings: None.  IMPRESSION: 1. Hypermetabolic left retroperitoneal mass extending through the lateral abdominal wall musculature to the cutaneous surface, maximum SUV 5.3. 2. Faintly calcified left omental nodules demonstrate mostly low-grade metabolic activity, maximum SUV 3.2. 3. Hypermetabolic intraluminal nodule along the left wall of the gallbladder has maximum SUV of 4.9, suspicious for gallbladder polyp, cancer, or metastatic lesion. 4. Bilateral cystic ovarian lesions with some hypermetabolic activity along the margins of the cystic lesions, ovarian malignancy is not excluded. 5. Faintly accentuated activity in the vicinity of thymic tissue, maximum SUV 4.3, mild increase in prominence of the size of this thymic tissue compared to 03/15/2022, query thymic rebound. 6. Focal hypermetabolic activity along the anterior uterine body and lower uterine segment, maximum SUV 6.2, cannot exclude serosal deposition of malignancy in this vicinity. 7. There is some focal accentuated uptake in the rectum, maximum SUV 8.4, nonspecific. 8. Symmetric accentuated uptake along the  palatine tonsillar regions, probably physiologic given the lack of significant CT correlate. 9. Left anterior descending coronary artery atherosclerosis and mild cardiomegaly. 10. Uterine fibroids.   Electronically Signed By: Van Clines M.D. On: 04/26/2022 10:25   MR Pelvis W Wo Contrast 04/08/2022  Narrative CLINICAL DATA:  Bilateral adnexal masses. Previous sigmoid colon carcinoma.  EXAM: MRI PELVIS WITHOUT AND WITH CONTRAST  TECHNIQUE: Multiplanar multisequence MR imaging of the pelvis was performed both before and after administration of intravenous contrast.  CONTRAST:  81m GADAVIST GADOBUTROL 1 MMOL/ML IV SOLN  COMPARISON:  CT on 03/15/2022  FINDINGS: Lower Urinary Tract: No urinary bladder or urethral abnormality identified.  Bowel: Unremarkable pelvic bowel loops.  Vascular/Lymphatic: Unremarkable. No pathologically enlarged pelvic lymph nodes identified.  Reproductive:  -- Uterus: Measures 13.6 x 10.2 x 9.8 cm (volume = 710 cm^3). Dominant fibroid showing central cystic degeneration is seen in the anterior uterine fundus which measures 8.5 cm in maximum diameter. A small intramural fibroid is seen in the anterior lower uterine segment which measures 1.9 cm. No evidence of abnormal uterine thickening. Cervix is normal in appearance.  -- Right ovary: A complex cystic lesion is seen which contains multiple thin internal septations and a solid enhancing mural nodular density measuring 1.7 cm (see image 18/7 and 20/24). Overall size of this lesion is 6.9 by 4.8 x 8.6 cm.  -- Left ovary: Complex cystic lesion is seen which contains several internal septations, some which show mild thickening and nodularity. An enhancing areas soft tissue nodularity is noted along the medial wall measuring 1.4 cm on image 14/6.  Other: No evidence of ascites. A heterogeneous soft tissue mass with enhancement is also seen in the posterior left lower  quadrant adjacent to the psoas muscle, measuring approximately 4.6 x 2.9 cm. Left lower quadrant colostomy is also noted.  Musculoskeletal:  Unremarkable.  IMPRESSION: Large bilateral complex adnexal cystic lesions showing internal septations and mural nodularity, worrisome for malignancy. Differential diagnosis includes bilateral cystic ovarian neoplasms, metastatic disease, and complex hydrosalpinges. Correlation with tumor markers may be helpful.  4.6 cm heterogeneous mass in the posterior left lower quadrant adjacent to the psoas muscle, suspicious for metastatic disease.  Uterine fibroids, largest measuring 8.5 cm.   Electronically Signed By: JMarlaine HindM.D. On: 04/09/2022 18:42

## 2022-04-27 NOTE — Assessment & Plan Note (Signed)
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 She has been tolerating well overall but has developed joint pain which could be related, overall manageable. She is clinically doing better also with more energy and less abdominal pain   -her CEA has normalized on immunotherapy. -CT CAP on 03/15/22 again showed mixed response

## 2022-04-27 NOTE — Patient Instructions (Signed)
[facility] MEDICAL [facility]COLOGY  Discharge Instructions: Thank you for choosing [facility] to provide your oncology and hematology care.   If you have a lab appointment with the Cancer Center, please go directly to the Cancer Center and check in at the registration area.   Wear comfortable clothing and clothing appropriate for easy access to any Portacath or PICC line.   We strive to give you quality time with your provider. You may need to reschedule your appointment if you arrive late (15 or more minutes).  Arriving late affects you and other patients whose appointments are after yours.  Also, if you miss three or more appointments without notifying the office, you may be dismissed from the clinic at the provider's discretion.      For prescription refill requests, have your pharmacy contact our office and allow 72 hours for refills to be completed.    Today you received the following chemotherapy and/or immunotherapy agent: Pembrolizumab (Keytruda)   To help prevent nausea and vomiting after your treatment, we encourage you to take your nausea medication as directed.  BELOW ARE SYMPTOMS THAT SHOULD BE REPORTED IMMEDIATELY: *FEVER GREATER THAN 100.4 F (38 C) OR HIGHER *CHILLS OR SWEATING *NAUSEA AND VOMITING THAT IS NOT CONTROLLED WITH YOUR NAUSEA MEDICATION *UNUSUAL SHORTNESS OF BREATH *UNUSUAL BRUISING OR BLEEDING *URINARY PROBLEMS (pain or burning when urinating, or frequent urination) *BOWEL PROBLEMS (unusual diarrhea, constipation, pain near the anus) TENDERNESS IN MOUTH AND THROAT WITH OR WITHOUT PRESENCE OF ULCERS (sore throat, sores in mouth, or a toothache) UNUSUAL RASH, SWELLING OR PAIN  UNUSUAL VAGINAL DISCHARGE OR ITCHING   Items with * indicate a potential emergency and should be followed up as soon as possible or go to the Emergency Department if any problems should occur.  Please show the CHEMOTHERAPY ALERT CARD or IMMUNOTHERAPY ALERT CARD at  check-in to the Emergency Department and triage nurse.  Should you have questions after your visit or need to cancel or reschedule your appointment, please contact [facility]  Dept: [phone]  and follow the prompts.  Office hours are 8:00 a.m. to 4:30 p.m. Monday - Friday. Please note that voicemails left after 4:00 p.m. may not be returned until the following business day.  We are closed weekends and major holidays. You have access to a nurse at all times for urgent questions. Please call the main number to the clinic Dept: [phone] and follow the prompts.   For any non-urgent questions, you may also contact your provider using MyChart. We now offer e-Visits for anyone 18 and older to request care online for non-urgent symptoms. For details visit [email or web address].   Also download the MyChart app! Go to the app store, search "MyChart", open the app, select [facility], and log in with your MyChart username and password.  Masks are optional in the cancer centers. If you would like for your care team to wear a mask while they are taking care of you, please let them know. You may have one support person who is at least 38 years old accompany you for your appointments. Pembrolizumab Injection What is this medication? PEMBROLIZUMAB (PEM broe LIZ ue mab) treats some types of cancer. It works by helping your immune system slow or stop the spread of cancer cells. It is a monoclonal antibody. This medicine may be used for other purposes; ask your health care provider or pharmacist if you have questions. COMMON BRAND NAME(S): Keytruda What should I tell   my care team before I take this medication? They need to know if you have any of these conditions: Allogeneic stem cell transplant (uses someone else's stem cells) Autoimmune diseases, such as Crohn disease, ulcerative colitis, lupus History of chest radiation Nervous system problems, such as Guillain-Barre  syndrome, myasthenia gravis Organ transplant An unusual or allergic reaction to pembrolizumab, other medications, foods, dyes, or preservatives Pregnant or trying to get pregnant Breast-feeding How should I use this medication? This medication is injected into a vein. It is given by your care team in a hospital or clinic setting. A special MedGuide will be given to you before each treatment. Be sure to read this information carefully each time. Talk to your care team about the use of this medication in children. While it may be prescribed for children as young as 6 months for selected conditions, precautions do apply. Overdosage: If you think you have taken too much of this medicine contact a poison control center or emergency room at once. NOTE: This medicine is only for you. Do not share this medicine with others. What if I miss a dose? Keep appointments for follow-up doses. It is important not to miss your dose. Call your care team if you are unable to keep an appointment. What may interact with this medication? Interactions have not been studied. This list may not describe all possible interactions. Give your health care provider a list of all the medicines, herbs, non-prescription drugs, or dietary supplements you use. Also tell them if you smoke, drink alcohol, or use illegal drugs. Some items may interact with your medicine. What should I watch for while using this medication? Your condition will be monitored carefully while you are receiving this medication. You may need blood work while taking this medication. This medication may cause serious skin reactions. They can happen weeks to months after starting the medication. Contact your care team right away if you notice fevers or flu-like symptoms with a rash. The rash may be red or purple and then turn into blisters or peeling of the skin. You may also notice a red rash with swelling of the face, lips, or lymph nodes in your neck or under  your arms. Tell your care team right away if you have any change in your eyesight. Talk to your care team if you may be pregnant. Serious birth defects can occur if you take this medication during pregnancy and for 4 months after the last dose. You will need a negative pregnancy test before starting this medication. Contraception is recommended while taking this medication and for 4 months after the last dose. Your care team can help you find the option that works for you. Do not breastfeed while taking this medication and for 4 months after the last dose. What side effects may I notice from receiving this medication? Side effects that you should report to your care team as soon as possible: Allergic reactions--skin rash, itching, hives, swelling of the face, lips, tongue, or throat Dry cough, shortness of breath or trouble breathing Eye pain, redness, irritation, or discharge with blurry or decreased vision Heart muscle inflammation--unusual weakness or fatigue, shortness of breath, chest pain, fast or irregular heartbeat, dizziness, swelling of the ankles, feet, or hands Hormone gland problems--headache, sensitivity to light, unusual weakness or fatigue, dizziness, fast or irregular heartbeat, increased sensitivity to cold or heat, excessive sweating, constipation, hair loss, increased thirst or amount of urine, tremors or shaking, irritability Infusion reactions--chest pain, shortness of breath or trouble breathing,   feeling faint or lightheaded Kidney injury (glomerulonephritis)--decrease in the amount of urine, red or dark brown urine, foamy or bubbly urine, swelling of the ankles, hands, or feet Liver injury--right upper belly pain, loss of appetite, nausea, light-colored stool, dark yellow or brown urine, yellowing skin or eyes, unusual weakness or fatigue Pain, tingling, or numbness in the hands or feet, muscle weakness, change in vision, confusion or trouble speaking, loss of balance or  coordination, trouble walking, seizures Rash, fever, and swollen lymph nodes Redness, blistering, peeling, or loosening of the skin, including inside the mouth Sudden or severe stomach pain, bloody diarrhea, fever, nausea, vomiting Side effects that usually do not require medical attention (report to your care team if they continue or are bothersome): Bone, joint, or muscle pain Diarrhea Fatigue Loss of appetite Nausea Skin rash This list may not describe all possible side effects. Call your doctor for medical advice about side effects. You may report side effects to FDA at 1-800-FDA-1088. Where should I keep my medication? This medication is given in a hospital or clinic. It will not be stored at home. NOTE: This sheet is a summary. It may not cover all possible information. If you have questions about this medicine, talk to your doctor, pharmacist, or health care provider.  2023 Elsevier/Gold Standard (2013-02-12 00:00:00)  

## 2022-04-27 NOTE — Assessment & Plan Note (Signed)
-  FO showed MSI-High disease, and multiple mutations including APC, [name]6, and BRCA2 mutations.  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  -we previously discussed testing for her children when they are of age.

## 2022-04-27 NOTE — Progress Notes (Signed)
[place]   [phone] [phone]   Clinic Follow up Note   Patient Care Team: [name], MD as PCP - General (Internal Medicine) [name], MD as PCP - Cardiology (Cardiology) [name], [name] as Physician Assistant (Emergency Medicine) [name], MD as Consulting Physician (Oncology) [name], MD as Consulting Physician (General Surgery) [name], [name], MD as Consulting Physician (Neurology) [name], [name], MD as Consulting Physician (Obstetrics and Gynecology) [name], MD as Consulting Physician (Gastroenterology) [name], [name], NP as Nurse Practitioner (Nurse Practitioner)  Date of Service:  04/27/2022  CHIEF COMPLAINT: f/u of colon cancer  CURRENT THERAPY:  -Keytruda, q21 days, starting 09/08/21 -Iv Venofer as needed if ferritin <100  ASSESSMENT:  Mikayla Rios is a 36 y.o. 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 She has been tolerating well overall but has developed joint pain which could be related, overall manageable. She is clinically doing better also with more energy and less abdominal pain   -her CEA has normalized on immunotherapy. 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  -we previously discussed testing for her children when they are of age.  Anemia -blood transfusion if Hg<8.0 -patient reports  struggling with some constipation with taking iron pills in the past. Will give IV iron as needed to help bolster iron levels, last 02/22/22.   PLAN: -we reviewed her PET scan results, which she discussed in detail with Dr. Ernestina Patches yesterday, 11/20.  -labs reviewed, adequate for treatment  -proceed with Keytruda today as scheduled   SUMMARY OF ONCOLOGIC HISTORY: Oncology History Overview Note   Cancer Staging  Malignant neoplasm of sigmoid colon [facility]) Staging form: Colon and Rectum, AJCC 8th Edition - Pathologic stage from 04/15/2021: Stage IIC (pT4b, pN0, cM0) - Signed by [name], MD on 06/12/2021    Malignant neoplasm of sigmoid colon [facility])   Initial Diagnosis   Malignant neoplasm of sigmoid colon ([place])   11/04/2020 Imaging   EXAM: CT ABDOMEN AND PELVIS WITHOUT CONTRAST  IMPRESSION: 1. Perforating descending colonic diverticulitis with multiple abdominopelvic gas and fluid collections, measuring up to 5.7 cm and further described above. 2. Cholelithiasis without findings of acute cholecystitis. 3. 3.6 cm benign left adrenal adenoma. 4. Leiomyomatous uterus. 5. 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   11/07/2020 Imaging   EXAM: CT ABDOMEN AND PELVIS WITHOUT CONTRAST  IMPRESSION: Continued wall thickening is seen involving descending colon suggesting infectious or inflammatory colitis or perforated diverticulitis. There is an adjacent fluid collection measuring 7.0 x 5.0 cm consistent with abscess which is significantly enlarged compared to prior exam. Adjacent to the abscess, there is a severely thickened small bowel loop most consistent with secondary inflammation.   5.2 x 3.5 cm fluid collection is noted within the left psoas muscle consistent with abscess which is significantly enlarged compared to prior exam. 4.4 x 3.8 cm fluid collection consistent with abscess  is noted in  the left retroperitoneal region which is also enlarged compared to prior exam.   Fibroid uterus.   Cholelithiasis.   11/27/2020 Imaging   EXAM: CT ABDOMEN AND PELVIS WITHOUT CONTRAST  IMPRESSION: 1. Significantly interval decreased size of the previously visualized left retroperitoneal and left anterior mid abdominal fluid collections. There is persistent fat stranding and mural thickening of the mid descending colon at this level. 2. Unchanged leiomyomatous uterus. 3. Unchanged cholelithiasis.   12/11/2020 Imaging   EXAM: CT ABDOMEN AND PELVIS WITHOUT CONTRAST  IMPRESSION: 1. Stable inflammatory changes centered around the distal descending colon in the left lower abdomen. Pericolonic inflammatory changes have minimally changed since 11/27/2020. Small pocket of gas medial to the colon is probably associated with a fistula or small residual abscess collection. 2. Stable position of the two percutaneous drains. The more anterior drain may be extending through a portion of the small bowel. 3. Cholelithiasis. 4. Fibroid uterus. Cannot exclude an ovarian/adnexal cystic structure near the uterine fundus. 5. Left adrenal adenoma.   01/07/2021 Imaging   EXAM: CT ABDOMEN AND PELVIS WITH CONTRAST  IMPRESSION: 1. No new abscesses identified. Similar degree of soft tissue thickening seen in the left pericolonic region. The degree of persistent soft tissues thickening in the pericolonic space further raises suspicions for malignancy. Further evaluation with colonoscopy should be performed. 2. Left retroperitoneal abscess drain unchanged in position. Anterior left abdominal drain again seen terminating within small bowel loop. 3. 2.6 cm mildly sclerotic lesion noted in the L2 vertebral body. Further evaluation with contrast enhanced lumbar spine MRI should be performed.   02/04/2021 Imaging   EXAM: CT ABDOMEN AND PELVIS WITH CONTRAST  IMPRESSION: No new abdominopelvic collections or  abscess development in the 1 month interval.   Left anterior drain remains within a loop of small bowel, unchanged.   Left lateral abscess drain remains in the retroperitoneal space adjacent to the iliopsoas muscle with a small amount of residual fluid and air but no measurable collection.   Stable soft tissue prominence and pericolonic strandy edema/inflammation about the left descending colon compatible with residual diverticulitis/colitis. Difficult to exclude underlying transmural lesion.   04/01/2021 Imaging   EXAM: CT ABDOMEN AND PELVIS WITH CONTRAST  IMPRESSION: There appears to be significant enhancement and wall thickening involving the descending colon with some degree of traction and involvement of adjacent small bowel loops. This is consistent with the history of diverticulitis and perforation. Stable position of percutaneous drainage catheter is seen adjacent to left psoas muscle with no significant residual fluid remaining. The other percutaneous drainage catheter that was previously noted to be within small bowel loop on prior exam in the left lower quadrant, has significantly retracted and appears to be outside of the peritoneal space at this time.   Since the prior exam, there does appear to be some degree of rotation involving mesenteric vessels and structures in the right lower quadrant, suggesting partial volvulus or malrotation. There is the interval development of several lymph nodes in this area, most likely inflammatory or reactive in etiology. Mild amount of free fluid is also noted in the pelvis. However, no significant bowel wall thickening or dilatation is seen in this area. These results will be called to the ordering clinician or representative by the Radiologist Assistant, and communication documented in the PACS or zVision Dashboard.   Stable uterine fibroid.   Hepatic steatosis.   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   Cholelithiasis.    04/10/2021 Imaging  EXAM: CT ANGIOGRAPHY CHEST CT ABDOMEN AND PELVIS WITH CONTRAST  IMPRESSION: 1. Again seen are findings compatible with descending colon diverticulitis with perforation. Free air and inflammation have increased in the interval. 2. New lobulated enhancing fluid collection posterior to this segment of inflamed colon measuring 8.5 x 3.5 x 10.0 cm. This collection now invades the adjacent iliopsoas muscle as well as extends through the left lateral abdominal wall. The tip of the drainage catheter is in this collection. Findings are compatible with abscess. 3. Anterior left percutaneous drainage catheter tip has been pulled back and is now within the subcutaneous tissues. 4. Trace free fluid. 5. No pulmonary embolism.  No acute cardiopulmonary process. 6. Cholelithiasis. 7. Fatty infiltration of the liver.   04/15/2021 Definitive Surgery   FINAL MICROSCOPIC DIAGNOSIS:   A. SMALL BOWEL, RESECTION:  - Adenocarcinoma.  - No carcinoma identified in 1 lymph node.   B. PERFORATED LEFT COLON, RESECTION:  - Moderately differentiated colonic adenocarcinoma.  - Tumor extends into pericolonic adipose tissue, and is strongly  suggestive of invasion into small bowel.  See oncology table/comments.  - No carcinoma identified in 4 lymph nodes (0/4).  - Tubular adenoma with high-grade dysplasia, 1.  - Tubular adenomas with low grade dysplasia, 3.   Comments: The size of the tumor is difficult to estimate secondary to the disrupted nature of the specimen, as well as the infiltrative nature of the tumor.  Tumor can be identified as definitely invading into the pericolonic adipose tissue (block B4), but given the extreme disruption of the tissue, and the involvement of the small bowel by what appears to be a colonic adenocarcinoma, I favor perforation of the large bowel with direct invasion into the small bowel.  Accordingly, I believe this is best regarded as a pT4b lesion.    ADDENDUM:  Mismatch Repair Protein (IHC)  SUMMARY INTERPRETATION: ABNORMAL  There is loss of the major MMR protein [name]6: This indicates a high probability that a hereditary germline mutation is present and referral to genetic counseling is warranted. It is recommended that the loss of protein expression be correlated with molecular based microsatellite instability testing.   IHC EXPRESSION RESULTS  TEST           RESULT  MLH1:          Preserved nuclear expression  [name]2:          Preserved nuclear expression  [name]6:          LOSS OF NUCLEAR EXPRESSION  PMS2:          Preserved nuclear expression    04/15/2021 Cancer Staging   Staging form: Colon and Rectum, AJCC 8th Edition - Pathologic stage from 04/15/2021: Stage IIC (pT4b, pN0, cM0) - Signed by [name], MD on 06/12/2021 Stage prefix: Initial diagnosis Total positive nodes: 0 Histologic grading system: 4 grade system Histologic grade (G): G2 Residual tumor (R): R0 - None   04/29/2021 Imaging   EXAM: CT ABDOMEN AND PELVIS WITH CONTRAST  IMPRESSION: Continued left retroperitoneal abscesses inferior to the left kidney, involving the left psoas muscle and left abdominal wall musculature. Overall size is decreased since prior study. Interval removal of left lower quadrant abscess drainage catheter.   New fluid collection in the cul-de-sac and wrapping around the uterus concerning for abscess.   Cholelithiasis.   Small left pleural effusion.  Bibasilar atelectasis.   Stable left adrenal adenoma.  ADDENDUM: After discussing the case with Dr. Dwaine Gale in interventional radiology, it was noted that  the fluid in the pelvis originally thought to be in the cul-de-sac is likely within the vagina, best seen on sagittal imaging. Recommend speculum exam.   Also, in the left lateral wall in the area of prior abscess in the lateral wall abdominal musculature, some of the abdomen soft tissue appears to be enhancing. While this could be  infectious, cannot exclude tumor seeding in the left lateral abdominal wall, measuring 6.9 x 3.8 cm on image 64 of series 2.   06/16/2021 Imaging   EXAM: CT ABDOMEN AND PELVIS WITH CONTRAST  IMPRESSION: Increased size of bulky soft tissue masses involving the left psoas muscle and left lateral abdominal wall soft tissues, consistent with progressive metastatic disease.   Significant progression of multiple peritoneal masses throughout the abdomen and pelvis, consistent with peritoneal carcinomatosis.   New mild retroperitoneal lymphadenopathy, consistent with metastatic disease.   Stable uterine fibroid and left adrenal adenoma.   Cholelithiasis, without evidence of cholecystitis.   06/22/2021 - 08/19/2021 Chemotherapy   Patient is on Treatment Plan : COLORECTAL FOLFOX q14d x 6 months     06/22/2021 Tumor Marker   Patient's tumor was tested for the following markers: CEA. Results of the tumor marker test revealed 87.41.   07/10/2021 Pathology Results   FINAL MICROSCOPIC DIAGNOSIS:   A. PERITONEAL MASS, LEFT UPPER ABDOMINAL QUADRANT, BIOPSY:  -  Metastatic adenocarcinoma with necrosis, histologically consistent with colon primary.     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 The report date is 07/16/2021. The remainder of testing was negative/normal.  The CancerNext-Expanded + RNAinsight gene panel offered by [employer] and includes sequencing and rearrangement analysis for the following 77 genes: IP, ALK, APC*, ATM*, AXIN2, BAP1, BARD1, BLM, BMPR1A, BRCA1*, BRCA2*, BRIP1*, CDC73, CDH1*,CDK4, CDKN1B, CDKN2A, CHEK2*, CTNNA1, DICER1, FANCC, FH, FLCN, GALNT12, KIF1B, LZTR1, MAX, MEN1, MET, MLH1*, [name]2*, [name]3, [name]6*, MUTYH*, NBN, NF1*, NF2, NTHL1, PALB2*, PHOX2B, PMS2*, POT1, PRKAR1A, PTCH1, PTEN*, RAD51C*, RAD51D*,RB1, RECQL, RET, SDHA, SDHAF2, SDHB, SDHC, SDHD, SMAD4, SMARCA4, SMARCB1, SMARCE1, STK11, SUFU,  TMEM127, TP53*,TSC1, TSC2, VHL and XRCC2 (sequencing and deletion/duplication); EGFR, EGLN1, HOXB13, KIT, MITF, PDGFRA, POLD1 and POLE (sequencing only); EPCAM and GREM1 (deletion/duplication only).   08/28/2021 Imaging   EXAM: CT CHEST, ABDOMEN, AND PELVIS WITH CONTRAST  IMPRESSION: 1. Continued interval progression of multiple metastatic soft tissue nodules/masses in the abdomen and pelvis. 2. No evidence for metastatic disease in the chest. 3. Similar appearance of the subtle lesion in the L2 vertebral body, suspicious for metastatic involvement. 4. Cholelithiasis.   09/03/2021 -  Chemotherapy   Patient is on Treatment Plan : COLORECTAL Pembrolizumab (200) q21d     09/08/2021 - 02/01/2022 Chemotherapy   Patient is on Treatment Plan : COLORECTAL Pembrolizumab (200) q21d     01/08/2022 Imaging   CT CAP IMPRESSION: 1. Mixed response to therapy. Interval decrease in size of the conglomerate multilobulated mass extending from the left psoas into the left posterior pararenal space through the left lateral abdominal wall along the tract of the previously placed percutaneous drainage catheter. Interval decrease in size of right adnexal mass and omental masses within the right lower quadrant of the abdomen. Interval development of peritoneal carcinomatosis with omental caking within the left lower quadrant of the abdomen and left subdiaphragmatic region adjacent to the spleen and along the left pericolic gutter as well as development of ascites appearing loculated within the anterior peritoneum. 2. Interval development of mild left hydronephrosis secondary to stricturing of  the mid left ureter. 3. Stable left adrenal metastasis. 4. Stable sclerotic lesion within the L2 vertebral body. No new lytic or blastic bone lesions are seen. 5. Cholelithiasis. 6. No evidence of intrathoracic metastatic disease. 7. 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 Small fat and fluid  containing parastomal hernia. Moderate colonic stool burden. No evidence of obstruction.   03/15/2022 Imaging   IMPRESSION: 1. Status post sigmoid colon resection with left lower quadrant end colostomy. 2. Multicystic bilateral ovarian lesions are slightly increased in size. 3. Diminished, small volume of loculated appearing ascites throughout the abdomen and pelvis, with similar appearance of peritoneal thickening and nodularity throughout. 4. Heterogeneously calcified, conglomerate masses in the left retroperitoneum and abdominal wall are slightly diminished in size. 5. Left adrenal lesion not significantly changed. 6. Unchanged faintly sclerotic metastatic lesion of L2. 7. Constellation of findings is consistent with mixed response to treatment however with overall little interval change. 8. Minimal residual left hydronephrosis and proximal hydroureter, improved compared to prior examination. 9. Cholelithiasis. 10. Coronary artery disease, significantly advanced for patient age.   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 She was last seen by me on 04/05/22. She presents to the clinic accompanied by her husband. She reports some pain from her fibromyalgia, especially in her hands; I reviewed this is from the immunotherapy. She endorses using tylenol as needed. We discussed her recent PET scan, which she reviewed with Dr. Ernestina Patches yesterday, 11/20.   All other systems were reviewed with the patient and are negative.  MEDICAL HISTORY:  Past Medical History:  Diagnosis Date   Allergy    seasonal   Anemia    Blood infection 1985   Blood transfusion without reported diagnosis    Diverticulitis    Family history of breast cancer    Family  history of colon cancer    Family history of stomach cancer    Hypertension    Obesity    Sleep apnea     SURGICAL HISTORY: Past Surgical History:  Procedure Laterality Date   COLECTOMY WITH COLOSTOMY CREATION/HARTMANN PROCEDURE N/A 04/15/2021   Procedure: COLOSTOMY CREATION/HARTMANN PROCEDURE;  Surgeon: [name], MD;  Location: [facility];  Service: General;  Laterality: N/A;   IR CATHETER TUBE CHANGE  12/12/2020   IR CATHETER TUBE CHANGE  01/27/2021   IR CATHETER TUBE CHANGE  02/19/2021   IR CATHETER TUBE CHANGE  04/13/2021   IR IMAGING GUIDED PORT INSERTION  10/02/2021   IR RADIOLOGIST EVAL & MGMT  11/27/2020   IR RADIOLOGIST EVAL & MGMT  12/11/2020   IR RADIOLOGIST EVAL & MGMT  01/07/2021   IR RADIOLOGIST EVAL & MGMT  02/04/2021   IR SINUS/FIST TUBE CHK-NON GI  12/12/2020   IR SINUS/FIST TUBE CHK-NON GI  02/19/2021   LAPAROSCOPIC PARTIAL COLECTOMY N/A 04/15/2021   Procedure: LAPAROSCOPIC ASSISTED HARTMANN RESECTION;  Surgeon: [name], MD;  Location: [facility];  Service: General;  Laterality: N/A;    I have reviewed the social history and family history with the patient and they are unchanged from previous note.  ALLERGIES:  is allergic to chlorhexidine gluconate, shellfish allergy, and penicillins.  MEDICATIONS:  Current Outpatient Medications  Medication Sig Dispense Refill  ALPRAZolam (XANAX) 0.25 MG tablet Take 1 tablet (0.25 mg total) by mouth at bedtime as needed for anxiety. 30 tablet 0   amLODipine (NORVASC) 10 MG tablet TAKE 1 TABLET BY MOUTH EVERY DAY 90 tablet 3   DULoxetine (CYMBALTA) 60 MG capsule TAKE 1 CAPSULE BY MOUTH EVERY DAY 90 capsule 2   lidocaine-prilocaine (EMLA) cream Apply 1 Application topically as needed. 30 g 0   metoprolol tartrate 75 MG TABS Take 75 mg by mouth 2 (two) times daily. 90 tablet 3   oxyCODONE-acetaminophen (PERCOCET) 7.5-325 MG tablet Take 1 tablet by mouth every 8 (eight) hours as needed for severe pain. 30 tablet 0   POTASSIUM  PO Take by mouth.     No current facility-administered medications for this visit.   Facility-Administered Medications Ordered in Other Visits  Medication Dose Route Frequency Provider Last Rate Last Admin   fentaNYL (SUBLIMAZE) injection   Intravenous PRN Mir, Paula Libra, MD   50 mcg at 10/02/21 1436   midazolam (VERSED) injection   Intravenous PRN Mir, Paula Libra, MD   1 mg at 10/02/21 1436   sodium chloride flush (NS) 0.9 % injection 10 mL  10 mL Intracatheter PRN [name], MD   10 mL at 04/27/22 1223    PHYSICAL EXAMINATION: ECOG PERFORMANCE STATUS: 1 - Symptomatic but completely ambulatory  Vitals:   04/27/22 1046  BP: (!) 145/87  Pulse: 84  Resp: 18  Temp: 99.1 F (37.3 C)  SpO2: 100%   Wt Readings from Last 3 Encounters:  04/27/22 210 lb (95.3 kg)  04/05/22 203 lb 1.6 oz (92.1 kg)  03/29/22 202 lb (91.6 kg)     GENERAL:alert, no distress and comfortable SKIN: skin color normal, no rashes or significant lesions EYES: normal, Conjunctiva are pink and non-injected, sclera clear  NEURO: alert & oriented x 3 with fluent speech  LABORATORY DATA:  I have reviewed the data as listed    Latest Ref Rng & Units 04/27/2022   10:32 AM 04/05/2022    8:21 AM 03/16/2022    9:28 AM  CBC  WBC 4.0 - 10.5 K/uL 4.1  4.9  4.6   Hemoglobin 12.0 - 15.0 g/dL 10.4  9.7  9.3   Hematocrit 36.0 - 46.0 % 32.4  30.5  29.1   Platelets 150 - 400 K/uL 355  419  439         Latest Ref Rng & Units 04/27/2022   10:32 AM 04/05/2022    8:21 AM 03/16/2022    9:28 AM  CMP  Glucose 70 - 99 mg/dL 92  90  92   BUN 6 - 20 mg/dL _0 Creatinine 0.44 - 1.00 mg/dL 0.61  0.77  0.67   Sodium 135 - 145 mmol/L 137  137  138   Potassium 3.5 - 5.1 mmol/L 3.9  4.0  3.6   Chloride 98 - 111 mmol/L 108  107  109   CO2 22 - 32 mmol/L _1 Calcium 8.9 - 10.3 mg/dL 9.2  8.9  8.7   Total Protein 6.5 - 8.1 g/dL 7.4  7.5  7.4   Total Bilirubin 0.3 - 1.2 mg/dL 0.3  0.3  0.3   Alkaline Phos 38 -  126 U/L 105  133  149   AST 15 - 41 U/L _2 ALT 0 - 44 U/L 14  18  12  RADIOGRAPHIC STUDIES: I have personally reviewed the radiological images as listed and agreed with the findings in the report. NM PET Image Initial (PI) Skull Base To Thigh (F-18 FDG)  Result Date: 04/26/2022 CLINICAL DATA:  Subsequent treatment strategy for metastatic sigmoid colon cancer. EXAM: NUCLEAR MEDICINE PET SKULL BASE TO THIGH TECHNIQUE: 10.1 mCi F-18 FDG was injected intravenously. Full-ring PET imaging was performed from the skull base to thigh after the radiotracer. CT data was obtained and used for attenuation correction and anatomic localization. Fasting blood glucose: Ninety-three mg/dl COMPARISON:  Multiple exams, including MRI pelvis 04/08/2022 and CT scan 03/15/2022 FINDINGS: Mediastinal blood pool activity: SUV max 1.7 Liver activity: SUV max NA NECK: Symmetric accentuated uptake along the palatine tonsillar regions, maximum SUV 8.2 on the left and 7.1 on the right. Likely physiologic given the lack of significant CT correlate. Incidental CT findings: None. CHEST: Faintly accentuated activity in the vicinity of suspected thymic tissue based on morphologic findings on CT, maximum SUV 4.3, mild increase in prominence compared to 03/15/2022, query thymic rebound. Incidental CT findings: Right Port-A-Cath tip: Lower SVC. Left anterior descending coronary artery atherosclerosis and mild cardiomegaly. ABDOMEN/PELVIS: Abnormal in calcified left retroperitoneal mass extending through the lateral abdominal wall musculature to the cutaneous surface with associated speckled calcifications as described on prior CT, maximum SUV 5.3. This extends across to the psoas muscle. Faintly calcified left omental nodules demonstrate mostly low-grade metabolic activity, maximum SUV 3.2 in the vicinity of image 16 series 4. Intraluminal nodule along the left wall of the gallbladder has maximum SUV of 4.9, suspicious for  gallbladder polyp, cancer, or metastatic lesion. Small gallstones are also present. The known septated left ovarian cystic lesion has some hypermetabolic activity along its lower inferior margin with the uterus, maximum SUV 3.9. Cannot exclude a malignant nodule. Similarly the right cystic ovarian mass has some calcification along its left posterior margin with maximum SUV of about 3.2, ovarian malignancy is not excluded on the right. Uterine fibroids noted. There is a faint caller calcification along the anterior margin of the uterine body and lower uterine segment but above the urinary bladder with some associated hypermetabolic activity, cannot exclude deposition of malignancy in this vicinity, maximum SUV 6.2. Scattered hypermetabolic activity in the bowel, probably physiologic. There is some focal activity in the rectum, maximum SUV 8.4, nonspecific. There is some other small calcified omental nodules, for example near the stomach on image 99 of series 4, without measurable hypermetabolic activity Incidental CT findings: Left colostomy. Simple appearing left kidney lower pole cyst. No further imaging workup of this lesion is indicated. Postoperative findings in small bowel in the right abdomen. Uterine fibroids. SKELETON: No significant abnormal hypermetabolic activity in this region. Incidental CT findings: None. IMPRESSION: 1. Hypermetabolic left retroperitoneal mass extending through the lateral abdominal wall musculature to the cutaneous surface, maximum SUV 5.3. 2. Faintly calcified left omental nodules demonstrate mostly low-grade metabolic activity, maximum SUV 3.2. 3. Hypermetabolic intraluminal nodule along the left wall of the gallbladder has maximum SUV of 4.9, suspicious for gallbladder polyp, cancer, or metastatic lesion. 4. Bilateral cystic ovarian lesions with some hypermetabolic activity along the margins of the cystic lesions, ovarian malignancy is not excluded. 5. Faintly accentuated activity  in the vicinity of thymic tissue, maximum SUV 4.3, mild increase in prominence of the size of this thymic tissue compared to 03/15/2022, query thymic rebound. 6. Focal hypermetabolic activity along the anterior uterine body and lower uterine segment, maximum SUV 6.2, cannot exclude serosal deposition of malignancy  in this vicinity. 7. There is some focal accentuated uptake in the rectum, maximum SUV 8.4, nonspecific. 8. Symmetric accentuated uptake along the palatine tonsillar regions, probably physiologic given the lack of significant CT correlate. 9. Left anterior descending coronary artery atherosclerosis and mild cardiomegaly. 10. Uterine fibroids. Electronically Signed   By: Van Clines M.D.   On: 04/26/2022 10:25      Orders Placed This Encounter  Procedures   CBC with Differential ([facility] Only)    Standing Status:   Future    Standing Expiration Date:   05/19/2023   CMP ([facility] only)    Standing Status:   Future    Standing Expiration Date:   05/19/2023   T4    Standing Status:   Future    Standing Expiration Date:   05/19/2023   TSH    Standing Status:   Future    Standing Expiration Date:   05/19/2023   CBC with Differential ([facility] Only)    Standing Status:   Future    Standing Expiration Date:   06/09/2023   CMP ([employer] only)    Standing Status:   Future    Standing Expiration Date:   06/09/2023   All questions were answered. The patient knows to call the clinic with any problems, questions or concerns. No barriers to learning was detected. The total time spent in the appointment was 30 minutes.     [name], MD 04/27/2022   I, Wilburn Mylar, am acting as scribe for [name], MD.   I have reviewed the above documentation for accuracy and completeness, and I agree with the above.

## 2022-04-27 NOTE — Assessment & Plan Note (Signed)
-  blood transfusion if Hg<8.0 -patient reports struggling with some constipation with taking iron pills in the past. Will give IV iron as needed to help bolster iron levels, last 02/22/22.

## 2022-04-28 ENCOUNTER — Telehealth: Payer: Self-pay | Admitting: Hematology

## 2022-05-03 ENCOUNTER — Other Ambulatory Visit: Payer: Self-pay | Admitting: *Deleted

## 2022-05-03 NOTE — Progress Notes (Signed)
The proposed treatment discussed in conference is for discussion purpose only and is not a binding recommendation.  The patients have not been physically examined, or presented with their treatment options.  Therefore, final treatment plans cannot be decided.  

## 2022-05-04 ENCOUNTER — Inpatient Hospital Stay: Payer: BC Managed Care – PPO | Admitting: Licensed Clinical Social Worker

## 2022-05-04 ENCOUNTER — Encounter: Payer: Self-pay | Admitting: Hematology

## 2022-05-04 DIAGNOSIS — C187 Malignant neoplasm of sigmoid colon: Secondary | ICD-10-CM

## 2022-05-04 NOTE — Progress Notes (Signed)
[facility] CSW Progress Note  Clinical Education officer, museum  received an email from pt stating that she has received an eviction notice w/ a court date of 05/06/2022.   Pt was previously connected to [facility] who reportedly were unable to assist pt.  CSW reached out to the [employer] who confirmed they will be able to assist pt with back rent owed; however, they will not be able to pick up the court costs.  At this time pt's phone is still disconnected.  Pt connected to the [employer] by email and will update CSW as appropriate.  Pt also states she has received a letter from disability stating she is 90% approved and is hopeful she will soon receive disability benefits.  CSW to check on pt at next infusion appointment if pt does not reach out sooner.      Henriette Combs, LCSW

## 2022-05-07 ENCOUNTER — Ambulatory Visit: Payer: BC Managed Care – PPO | Admitting: Physical Medicine and Rehabilitation

## 2022-05-18 ENCOUNTER — Encounter: Payer: Self-pay | Admitting: Nurse Practitioner

## 2022-05-18 ENCOUNTER — Inpatient Hospital Stay: Payer: BC Managed Care – PPO | Attending: Hematology

## 2022-05-18 ENCOUNTER — Inpatient Hospital Stay (HOSPITAL_BASED_OUTPATIENT_CLINIC_OR_DEPARTMENT_OTHER): Payer: BC Managed Care – PPO | Admitting: Nurse Practitioner

## 2022-05-18 ENCOUNTER — Inpatient Hospital Stay: Payer: BC Managed Care – PPO

## 2022-05-18 ENCOUNTER — Inpatient Hospital Stay: Payer: BC Managed Care – PPO | Admitting: Licensed Clinical Social Worker

## 2022-05-18 DIAGNOSIS — Z515 Encounter for palliative care: Secondary | ICD-10-CM

## 2022-05-18 DIAGNOSIS — R530 Neoplastic (malignant) related fatigue: Secondary | ICD-10-CM | POA: Diagnosis not present

## 2022-05-18 DIAGNOSIS — D649 Anemia, unspecified: Secondary | ICD-10-CM

## 2022-05-18 DIAGNOSIS — C187 Malignant neoplasm of sigmoid colon: Secondary | ICD-10-CM

## 2022-05-18 DIAGNOSIS — Z452 Encounter for adjustment and management of vascular access device: Secondary | ICD-10-CM

## 2022-05-18 DIAGNOSIS — C786 Secondary malignant neoplasm of retroperitoneum and peritoneum: Secondary | ICD-10-CM

## 2022-05-18 DIAGNOSIS — G893 Neoplasm related pain (acute) (chronic): Secondary | ICD-10-CM

## 2022-05-18 DIAGNOSIS — Z79899 Other long term (current) drug therapy: Secondary | ICD-10-CM | POA: Insufficient documentation

## 2022-05-18 DIAGNOSIS — Z5112 Encounter for antineoplastic immunotherapy: Secondary | ICD-10-CM | POA: Diagnosis not present

## 2022-05-18 DIAGNOSIS — D509 Iron deficiency anemia, unspecified: Secondary | ICD-10-CM

## 2022-05-18 LAB — CEA (IN HOUSE-CHCC): CEA (CHCC-In House): <1 ng/mL (ref 0.00–5.00)

## 2022-05-18 LAB — CBC WITH DIFFERENTIAL (CANCER CENTER ONLY)
Abs Immature Granulocytes: 0.02 10*3/uL (ref 0.00–0.07)
Basophils Absolute: 0 10*3/uL (ref 0.0–0.1)
Basophils Relative: 0 %
Eosinophils Absolute: 0.1 10*3/uL (ref 0.0–0.5)
Eosinophils Relative: 1 %
HCT: 33.3 % — ABNORMAL LOW (ref 36.0–46.0)
Hemoglobin: 10.8 g/dL — ABNORMAL LOW (ref 12.0–15.0)
Immature Granulocytes: 0 %
Lymphocytes Relative: 33 %
Lymphs Abs: 1.8 10*3/uL (ref 0.7–4.0)
MCH: 26.9 pg (ref 26.0–34.0)
MCHC: 32.4 g/dL (ref 30.0–36.0)
MCV: 82.8 fL (ref 80.0–100.0)
Monocytes Absolute: 0.3 10*3/uL (ref 0.1–1.0)
Monocytes Relative: 5 %
Neutro Abs: 3.3 10*3/uL (ref 1.7–7.7)
Neutrophils Relative %: 61 %
Platelet Count: 395 10*3/uL (ref 150–400)
RBC: 4.02 MIL/uL (ref 3.87–5.11)
RDW: 15.6 % — ABNORMAL HIGH (ref 11.5–15.5)
WBC Count: 5.5 10*3/uL (ref 4.0–10.5)
nRBC: 0 % (ref 0.0–0.2)

## 2022-05-18 LAB — CMP (CANCER CENTER ONLY)
ALT: 11 U/L (ref 0–44)
AST: 12 U/L — ABNORMAL LOW (ref 15–41)
Albumin: 3.8 g/dL (ref 3.5–5.0)
Alkaline Phosphatase: 81 U/L (ref 38–126)
Anion gap: 7 (ref 5–15)
BUN: 15 mg/dL (ref 6–20)
CO2: 25 mmol/L (ref 22–32)
Calcium: 9.1 mg/dL (ref 8.9–10.3)
Chloride: 107 mmol/L (ref 98–111)
Creatinine: 0.71 mg/dL (ref 0.44–1.00)
GFR, Estimated: >60 mL/min (ref >60)
Glucose, Bld: 91 mg/dL (ref 70–99)
Potassium: 3.6 mmol/L (ref 3.5–5.1)
Sodium: 139 mmol/L (ref 135–145)
Total Bilirubin: 0.2 mg/dL — ABNORMAL LOW (ref 0.3–1.2)
Total Protein: 6.8 g/dL (ref 6.5–8.1)

## 2022-05-18 LAB — IRON AND IRON BINDING CAPACITY (CC-WL,HP ONLY)
Iron: 26 ug/dL — ABNORMAL LOW (ref 28–170)
Saturation Ratios: 8 % — ABNORMAL LOW (ref 10.4–31.8)
TIBC: 321 ug/dL (ref 250–450)
UIBC: 295 ug/dL (ref 148–442)

## 2022-05-18 LAB — PREGNANCY, URINE: Preg Test, Ur: NEGATIVE

## 2022-05-18 LAB — TSH: TSH: 1.566 u[IU]/mL (ref 0.350–4.500)

## 2022-05-18 MED ORDER — SODIUM CHLORIDE 0.9 % IV SOLN: Freq: Once | INTRAVENOUS | Status: AC

## 2022-05-18 MED ORDER — HEPARIN SOD (PORK) LOCK FLUSH 100 UNIT/ML IV SOLN
500.0000 [IU] | Freq: Once | INTRAVENOUS | Status: AC | PRN
  Administered 2022-05-18: 500 [IU]

## 2022-05-18 MED ORDER — SODIUM CHLORIDE 0.9% FLUSH
10.0000 mL | Freq: Once | INTRAVENOUS | Status: AC
  Administered 2022-05-18: 10 mL

## 2022-05-18 MED ORDER — SODIUM CHLORIDE 0.9 % IV SOLN
200.0000 mg | Freq: Once | INTRAVENOUS | Status: AC
  Administered 2022-05-18: 200 mg via INTRAVENOUS
  Filled 2022-05-18: qty 200

## 2022-05-18 MED ORDER — SODIUM CHLORIDE 0.9% FLUSH
10.0000 mL | INTRAVENOUS | Status: DC | PRN
  Administered 2022-05-18: 10 mL

## 2022-05-18 NOTE — Progress Notes (Signed)
[place]   Telephone:([phone]) [phone] Fax:(336) [phone]   Clinic Follow up Note   Patient Care Team: [name], MD as PCP - General (Internal Medicine) [name], MD as PCP - Cardiology (Cardiology) [name], [name] as Physician Assistant (Emergency Medicine) [name], MD as Consulting Physician (Oncology) [name], MD as Consulting Physician (General Surgery) [name], [name], MD as Consulting Physician (Neurology) [name], [name], MD as Consulting Physician (Obstetrics and Gynecology) [name], MD as Consulting Physician (Gastroenterology) [name], [name], NP as Nurse Practitioner (Nurse Practitioner) 05/18/2022  CHIEF COMPLAINT: Follow up colon cancer   SUMMARY OF ONCOLOGIC HISTORY: Oncology History Overview Note   Cancer Staging  Malignant neoplasm of sigmoid colon [facility]) Staging form: Colon and Rectum, AJCC 8th Edition - Pathologic stage from 04/15/2021: Stage IIC (pT4b, pN0, cM0) - Signed by [name], MD on 06/12/2021    Malignant neoplasm of sigmoid colon [facility])   Initial Diagnosis   Malignant neoplasm of sigmoid colon ([place])   11/04/2020 Imaging   EXAM: CT ABDOMEN AND PELVIS WITHOUT CONTRAST  IMPRESSION: 1. Perforating descending colonic diverticulitis with multiple abdominopelvic gas and fluid collections, measuring up to 5.7 cm and further described above. 2. Cholelithiasis without findings of acute cholecystitis. 3. 3.6 cm benign left adrenal adenoma. 4. Leiomyomatous uterus. 5. 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   11/07/2020 Imaging   EXAM: CT ABDOMEN AND PELVIS WITHOUT CONTRAST  IMPRESSION: Continued wall thickening is seen involving descending colon suggesting infectious or inflammatory colitis or perforated diverticulitis. There is an adjacent fluid collection measuring 7.0 x  5.0 cm consistent with abscess which is significantly enlarged compared to prior exam. Adjacent to the abscess, there is a severely thickened small bowel loop most consistent with secondary inflammation.   5.2 x 3.5 cm fluid collection is noted within the left psoas muscle consistent with abscess which is significantly enlarged compared to prior exam. 4.4 x 3.8 cm fluid collection consistent with abscess is noted in the left retroperitoneal region which is also enlarged compared to prior exam.   Fibroid uterus.   Cholelithiasis.   11/27/2020 Imaging   EXAM: CT ABDOMEN AND PELVIS WITHOUT CONTRAST  IMPRESSION: 1. Significantly interval decreased size of the previously visualized left retroperitoneal and left anterior mid abdominal fluid collections. There is persistent fat stranding and mural thickening of the mid descending colon at this level. 2. Unchanged leiomyomatous uterus. 3. Unchanged cholelithiasis.   12/11/2020 Imaging   EXAM: CT ABDOMEN AND PELVIS WITHOUT CONTRAST  IMPRESSION: 1. Stable inflammatory changes centered around the distal descending colon in the left lower abdomen. Pericolonic inflammatory changes have minimally changed since 11/27/2020. Small pocket of gas medial to the colon is probably associated with a fistula or small residual abscess collection. 2. Stable position of the two percutaneous drains. The more anterior drain may be extending through a portion of the small bowel. 3. Cholelithiasis. 4. Fibroid uterus. Cannot exclude an ovarian/adnexal cystic structure near the uterine fundus. 5. Left adrenal adenoma.   01/07/2021 Imaging   EXAM: CT ABDOMEN AND PELVIS WITH CONTRAST  IMPRESSION: 1. No new abscesses identified. Similar degree of soft tissue thickening seen in the left pericolonic region. The degree of persistent soft tissues thickening in the pericolonic space further raises suspicions for malignancy. Further evaluation with colonoscopy should  be performed. 2. Left retroperitoneal abscess drain unchanged in position. Anterior left abdominal drain again seen terminating within  small bowel loop. 3. 2.6 cm mildly sclerotic lesion noted in the L2 vertebral body. Further evaluation with contrast enhanced lumbar spine MRI should be performed.   02/04/2021 Imaging   EXAM: CT ABDOMEN AND PELVIS WITH CONTRAST  IMPRESSION: No new abdominopelvic collections or abscess development in the 1 month interval.   Left anterior drain remains within a loop of small bowel, unchanged.   Left lateral abscess drain remains in the retroperitoneal space adjacent to the iliopsoas muscle with a small amount of residual fluid and air but no measurable collection.   Stable soft tissue prominence and pericolonic strandy edema/inflammation about the left descending colon compatible with residual diverticulitis/colitis. Difficult to exclude underlying transmural lesion.   04/01/2021 Imaging   EXAM: CT ABDOMEN AND PELVIS WITH CONTRAST  IMPRESSION: There appears to be significant enhancement and wall thickening involving the descending colon with some degree of traction and involvement of adjacent small bowel loops. This is consistent with the history of diverticulitis and perforation. Stable position of percutaneous drainage catheter is seen adjacent to left psoas muscle with no significant residual fluid remaining. The other percutaneous drainage catheter that was previously noted to be within small bowel loop on prior exam in the left lower quadrant, has significantly retracted and appears to be outside of the peritoneal space at this time.   Since the prior exam, there does appear to be some degree of rotation involving mesenteric vessels and structures in the right lower quadrant, suggesting partial volvulus or malrotation. There is the interval development of several lymph nodes in this area, most likely inflammatory or reactive in etiology. Mild  amount of free fluid is also noted in the pelvis. However, no significant bowel wall thickening or dilatation is seen in this area. These results will be called to the ordering clinician or representative by the Radiologist Assistant, and communication documented in the PACS or zVision Dashboard.   Stable uterine fibroid.   Hepatic steatosis.   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   Cholelithiasis.   04/10/2021 Imaging   EXAM: CT ANGIOGRAPHY CHEST CT ABDOMEN AND PELVIS WITH CONTRAST  IMPRESSION: 1. Again seen are findings compatible with descending colon diverticulitis with perforation. Free air and inflammation have increased in the interval. 2. New lobulated enhancing fluid collection posterior to this segment of inflamed colon measuring 8.5 x 3.5 x 10.0 cm. This collection now invades the adjacent iliopsoas muscle as well as extends through the left lateral abdominal wall. The tip of the drainage catheter is in this collection. Findings are compatible with abscess. 3. Anterior left percutaneous drainage catheter tip has been pulled back and is now within the subcutaneous tissues. 4. Trace free fluid. 5. No pulmonary embolism.  No acute cardiopulmonary process. 6. Cholelithiasis. 7. Fatty infiltration of the liver.   04/15/2021 Definitive Surgery   FINAL MICROSCOPIC DIAGNOSIS:   A. SMALL BOWEL, RESECTION:  - Adenocarcinoma.  - No carcinoma identified in 1 lymph node.   B. PERFORATED LEFT COLON, RESECTION:  - Moderately differentiated colonic adenocarcinoma.  - Tumor extends into pericolonic adipose tissue, and is strongly  suggestive of invasion into small bowel.  See oncology table/comments.  - No carcinoma identified in 4 lymph nodes (0/4).  - Tubular adenoma with high-grade dysplasia, 1.  - Tubular adenomas with low grade dysplasia, 3.   Comments: The size of the tumor is difficult to estimate secondary to the disrupted nature of the specimen, as well as the  infiltrative nature of the tumor.  Tumor can be  identified as definitely invading into the pericolonic adipose tissue (block B4), but given the extreme disruption of the tissue, and the involvement of the small bowel by what appears to be a colonic adenocarcinoma, I favor perforation of the large bowel with direct invasion into the small bowel.  Accordingly, I believe this is best regarded as a pT4b lesion.   ADDENDUM:  Mismatch Repair Protein (IHC)  SUMMARY INTERPRETATION: ABNORMAL  There is loss of the major MMR protein [name]6: This indicates a high probability that a hereditary germline mutation is present and referral to genetic counseling is warranted. It is recommended that the loss of protein expression be correlated with molecular based microsatellite instability testing.   IHC EXPRESSION RESULTS  TEST           RESULT  MLH1:          Preserved nuclear expression  [name]2:          Preserved nuclear expression  [name]6:          LOSS OF NUCLEAR EXPRESSION  PMS2:          Preserved nuclear expression    04/15/2021 Cancer Staging   Staging form: Colon and Rectum, AJCC 8th Edition - Pathologic stage from 04/15/2021: Stage IIC (pT4b, pN0, cM0) - Signed by [name], MD on 06/12/2021 Stage prefix: Initial diagnosis Total positive nodes: 0 Histologic grading system: 4 grade system Histologic grade (G): G2 Residual tumor (R): R0 - None   04/29/2021 Imaging   EXAM: CT ABDOMEN AND PELVIS WITH CONTRAST  IMPRESSION: Continued left retroperitoneal abscesses inferior to the left kidney, involving the left psoas muscle and left abdominal wall musculature. Overall size is decreased since prior study. Interval removal of left lower quadrant abscess drainage catheter.   New fluid collection in the cul-de-sac and wrapping around the uterus concerning for abscess.   Cholelithiasis.   Small left pleural effusion.  Bibasilar atelectasis.   Stable left adrenal adenoma.  ADDENDUM: After discussing  the case with Dr. Dwaine Gale in interventional radiology, it was noted that the fluid in the pelvis originally thought to be in the cul-de-sac is likely within the vagina, best seen on sagittal imaging. Recommend speculum exam.   Also, in the left lateral wall in the area of prior abscess in the lateral wall abdominal musculature, some of the abdomen soft tissue appears to be enhancing. While this could be infectious, cannot exclude tumor seeding in the left lateral abdominal wall, measuring 6.9 x 3.8 cm on image 64 of series 2.   06/16/2021 Imaging   EXAM: CT ABDOMEN AND PELVIS WITH CONTRAST  IMPRESSION: Increased size of bulky soft tissue masses involving the left psoas muscle and left lateral abdominal wall soft tissues, consistent with progressive metastatic disease.   Significant progression of multiple peritoneal masses throughout the abdomen and pelvis, consistent with peritoneal carcinomatosis.   New mild retroperitoneal lymphadenopathy, consistent with metastatic disease.   Stable uterine fibroid and left adrenal adenoma.   Cholelithiasis, without evidence of cholecystitis.   06/22/2021 - 08/19/2021 Chemotherapy   Patient is on Treatment Plan : COLORECTAL FOLFOX q14d x 6 months     06/22/2021 Tumor Marker   Patient's tumor was tested for the following markers: CEA. Results of the tumor marker test revealed 87.41.   07/10/2021 Pathology Results   FINAL MICROSCOPIC DIAGNOSIS:   A. PERITONEAL MASS, LEFT UPPER ABDOMINAL QUADRANT, BIOPSY:  -  Metastatic adenocarcinoma with necrosis, histologically consistent with colon primary.     Genetic Testing  Pathogenic variant in APC called c.1312+3A>G identified on the [employer] CancerNext-Expanded+RNA panel. The report date is 07/16/2021. The remainder of testing was negative/normal.  The CancerNext-Expanded + RNAinsight gene panel offered by [employer] and includes sequencing and rearrangement analysis for the following 77 genes: IP, ALK,  APC*, ATM*, AXIN2, BAP1, BARD1, BLM, BMPR1A, BRCA1*, BRCA2*, BRIP1*, CDC73, CDH1*,CDK4, CDKN1B, CDKN2A, CHEK2*, CTNNA1, DICER1, FANCC, FH, FLCN, GALNT12, KIF1B, LZTR1, MAX, MEN1, MET, MLH1*, [name]2*, [name]3, [name]6*, MUTYH*, NBN, NF1*, NF2, NTHL1, PALB2*, PHOX2B, PMS2*, POT1, PRKAR1A, PTCH1, PTEN*, RAD51C*, RAD51D*,RB1, RECQL, RET, SDHA, SDHAF2, SDHB, SDHC, SDHD, SMAD4, SMARCA4, SMARCB1, SMARCE1, STK11, SUFU, TMEM127, TP53*,TSC1, TSC2, VHL and XRCC2 (sequencing and deletion/duplication); EGFR, EGLN1, HOXB13, KIT, MITF, PDGFRA, POLD1 and POLE (sequencing only); EPCAM and GREM1 (deletion/duplication only).   08/28/2021 Imaging   EXAM: CT CHEST, ABDOMEN, AND PELVIS WITH CONTRAST  IMPRESSION: 1. Continued interval progression of multiple metastatic soft tissue nodules/masses in the abdomen and pelvis. 2. No evidence for metastatic disease in the chest. 3. Similar appearance of the subtle lesion in the L2 vertebral body, suspicious for metastatic involvement. 4. Cholelithiasis.   09/03/2021 -  Chemotherapy   Patient is on Treatment Plan : COLORECTAL Pembrolizumab (200) q21d     09/08/2021 - 02/01/2022 Chemotherapy   Patient is on Treatment Plan : COLORECTAL Pembrolizumab (200) q21d     01/08/2022 Imaging   CT CAP IMPRESSION: 1. Mixed response to therapy. Interval decrease in size of the conglomerate multilobulated mass extending from the left psoas into the left posterior pararenal space through the left lateral abdominal wall along the tract of the previously placed percutaneous drainage catheter. Interval decrease in size of right adnexal mass and omental masses within the right lower quadrant of the abdomen. Interval development of peritoneal carcinomatosis with omental caking within the left lower quadrant of the abdomen and left subdiaphragmatic region adjacent to the spleen and along the left pericolic gutter as well as development of ascites appearing loculated within the anterior peritoneum. 2. Interval  development of mild left hydronephrosis secondary to stricturing of the mid left ureter. 3. Stable left adrenal metastasis. 4. Stable sclerotic lesion within the L2 vertebral body. No new lytic or blastic bone lesions are seen. 5. Cholelithiasis. 6. No evidence of intrathoracic metastatic disease. 7. 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 Moderate colonic stool burden. No evidence of obstruction.   03/15/2022 Imaging   IMPRESSION: 1. Status post sigmoid colon resection with left lower quadrant end colostomy. 2. Multicystic bilateral ovarian lesions are slightly increased in size. 3. Diminished, small volume of loculated appearing ascites throughout the abdomen and pelvis, with similar appearance of peritoneal thickening and nodularity throughout. 4. Heterogeneously calcified, conglomerate masses in the left retroperitoneum and abdominal wall are slightly diminished in size. 5. Left adrenal lesion not significantly changed. 6. Unchanged faintly sclerotic metastatic lesion of L2. 7. Constellation of findings is consistent with mixed response to treatment however with overall little interval change. 8. Minimal residual left hydronephrosis and proximal hydroureter, improved compared to prior examination. 9. Cholelithiasis. 10. Coronary artery disease, significantly advanced for patient age.   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 Burr Medico 11/21 and completed another cycle of Keytruda. Case was reviewed in gyn onc tumor board, no additional biopsies are recommended and  she continues [place].   She had drenching night sweats for a few weeks and noticed her period came twice last month, normal now and has not noticed recurrent hot flashes/night sweats.  Fibromyalgia pain fluctuates but currently stable.  She may need her wisdom teeth out and a couple root canals.  Otherwise doing well without specific complaints.  Denies abdominal pain, fever, chills, cough, chest pain, dyspnea, any bleeding, nausea/vomiting, or bowel issues.  All other systems were reviewed with the patient and are negative.  MEDICAL HISTORY:  Past Medical History:  Diagnosis Date   Allergy    seasonal   Anemia    Blood infection 1985   Blood transfusion without reported diagnosis    Diverticulitis    Family history of breast cancer    Family history of colon cancer    Family history of stomach cancer    Hypertension    Obesity    Sleep apnea     SURGICAL HISTORY: Past Surgical History:  Procedure Laterality Date   COLECTOMY WITH COLOSTOMY CREATION/HARTMANN PROCEDURE N/A 04/15/2021   Procedure: COLOSTOMY CREATION/HARTMANN PROCEDURE;  Surgeon: [name], MD;  Location: [facility];  Service: General;  Laterality: N/A;   IR CATHETER TUBE CHANGE  12/12/2020   IR CATHETER TUBE CHANGE  01/27/2021   IR CATHETER TUBE CHANGE  02/19/2021   IR CATHETER TUBE CHANGE  04/13/2021   IR IMAGING GUIDED PORT INSERTION  10/02/2021   IR RADIOLOGIST EVAL & MGMT  11/27/2020   IR RADIOLOGIST EVAL & MGMT  12/11/2020   IR RADIOLOGIST EVAL & MGMT  01/07/2021   IR RADIOLOGIST EVAL & MGMT  02/04/2021   IR SINUS/FIST TUBE CHK-NON GI  12/12/2020   IR SINUS/FIST TUBE CHK-NON GI  02/19/2021   LAPAROSCOPIC PARTIAL COLECTOMY N/A 04/15/2021   Procedure: LAPAROSCOPIC ASSISTED HARTMANN RESECTION;  Surgeon: [name], MD;  Location: [facility];  Service: General;  Laterality: N/A;    I have reviewed the social history and family history with the patient and they are unchanged from previous note.  ALLERGIES:  is  allergic to chlorhexidine gluconate, shellfish allergy, and penicillins.  MEDICATIONS:  Current Outpatient Medications  Medication Sig Dispense Refill   ALPRAZolam (XANAX) 0.25 MG tablet Take 1 tablet (0.25 mg total) by mouth at bedtime as needed for anxiety. 30 tablet 0   amLODipine (NORVASC) 10 MG tablet TAKE 1 TABLET BY MOUTH EVERY DAY 90 tablet 3   DULoxetine (CYMBALTA) 60 MG capsule TAKE 1 CAPSULE BY MOUTH EVERY DAY 90 capsule 2   lidocaine-prilocaine (EMLA) cream Apply 1 Application topically as needed. 30 g 0   metoprolol tartrate 75 MG TABS Take 75 mg by mouth 2 (two) times daily. 90 tablet 3   oxyCODONE-acetaminophen (PERCOCET) 7.5-325 MG tablet Take 1 tablet by mouth every 8 (eight) hours as needed for severe pain. 30 tablet 0   POTASSIUM PO Take by mouth.     No current facility-administered medications for this visit.   Facility-Administered Medications Ordered in Other Visits  Medication Dose Route Frequency Provider Last Rate Last Admin   fentaNYL (SUBLIMAZE) injection   Intravenous PRN Mir, Paula Libra, MD   50 mcg at 10/02/21 1436   heparin lock flush 100 unit/mL  500 Units Intracatheter Once PRN [name], MD       iron sucrose (  VENOFER) 200 mg in sodium chloride 0.9 % 100 mL IVPB  200 mg Intravenous Once [name], MD       midazolam (VERSED) injection   Intravenous PRN Mir, Paula Libra, MD   1 mg at 10/02/21 1436   pembrolizumab (KEYTRUDA) 200 mg in sodium chloride 0.9 % 50 mL chemo infusion  200 mg Intravenous Once [name], MD       sodium chloride flush (NS) 0.9 % injection 10 mL  10 mL Intracatheter PRN [name], MD        PHYSICAL EXAMINATION: ECOG PERFORMANCE STATUS: 1 - Symptomatic but completely ambulatory  Vitals:   05/18/22 1300 05/18/22 1303  BP: 130/78 (!) 140/97  Pulse:  89  Resp:  16  Temp:  98.4 F (36.9 C)  SpO2:  100%   Filed Weights   05/18/22 1303  Weight: 221 lb (100.2 kg)    GENERAL:alert, no distress and comfortable SKIN: No  rash EYES: sclera clear OROPHARYNX: No thrush, ulcers, or obvious periodontal disease LUNGS:  normal breathing effort HEART:  no lower extremity edema NEURO: alert & oriented x 3 with fluent speech, no focal motor/sensory deficits PAC without erythema  LABORATORY DATA:  I have reviewed the data as listed    Latest Ref Rng & Units 05/18/2022   12:43 PM 04/27/2022   10:32 AM 04/05/2022    8:21 AM  CBC  WBC 4.0 - 10.5 K/uL 5.5  4.1  4.9   Hemoglobin 12.0 - 15.0 g/dL 10.8  10.4  9.7   Hematocrit 36.0 - 46.0 % 33.3  32.4  30.5   Platelets 150 - 400 K/uL 395  355  419         Latest Ref Rng & Units 05/18/2022   12:43 PM 04/27/2022   10:32 AM 04/05/2022    8:21 AM  CMP  Glucose 70 - 99 mg/dL 91  92  90   BUN 6 - 20 mg/dL _0 Creatinine 0.44 - 1.00 mg/dL 0.71  0.61  0.77   Sodium 135 - 145 mmol/L 139  137  137   Potassium 3.5 - 5.1 mmol/L 3.6  3.9  4.0   Chloride 98 - 111 mmol/L 107  108  107   CO2 22 - 32 mmol/L _1 Calcium 8.9 - 10.3 mg/dL 9.1  9.2  8.9   Total Protein 6.5 - 8.1 g/dL 6.8  7.4  7.5   Total Bilirubin 0.3 - 1.2 mg/dL 0.2  0.3  0.3   Alkaline Phos 38 - 126 U/L 81  105  133   AST 15 - 41 U/L _2 ALT 0 - 44 U/L _3 RADIOGRAPHIC STUDIES: I have personally reviewed the radiological images as listed and agreed with the findings in the report. No results found.   ASSESSMENT & PLAN: Mikayla Rios is a 36 y.o. female with    1. 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 She was found to have diverticulitis with perforation. She also developed an abscess requiring 2 drains. She continued to have abdominal pain and drainage, undergoing drain exchange three times. -she developed worsening weakness and was admitted on 04/10/21. She was taken for emergent small bowel and left colon resection on 04/15/21 under Dr. Johney Maine.  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 Margins negative for invasive carcinoma, but low-grade dysplasia involves proximal margin. All 5 lymph nodes negative (0/5). MSI low. -repeat CT AP on 06/16/21 showed progressive metastatic disease involving soft tissue masses in left psoas muscle and left lateral abdominal wall and peritoneal masses, and new mild retroperitoneal lymphadenopathy.  -baseline CEA on 06/22/21 was elevated at 87.41. -biopsy of the LUQ peritoneal mass on 07/10/21 confirmed metastatic adenocarcinoma with necrosis, consistent with colon primary. -FO showed MSI-H disease, BRCA, [name]6, and APC mutations among others which are not targetable. 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  Due to BRCA mutation on FO, she may be a candidate for PARP inhibitor down the line -she progressed on first line FOLFOX (06/22/21 - 08/17/21) and switch to second line pembrolizumab q. 21 days starting 09/08/2021 -Restaging CT CAP from 01/08/2022 decreased size of the mass involving the left psoas muscle, improved omental and ovarian deposits, with slightly increased omental caking in the left lower abdomen.  No new metastatic disease.  Overall consistent with a partial treatment response.  She has improved clinically and CEA normalized on immunotherapy -CT 03/15/28 again showed mixed response, and PET with multiple findings overall indeterminate for mixed response to [place] vs secondary malignancy but gyn/onc felt less likely. No further gyn biopsies are recommended at this time (I reviewed this with her today 05/18/22) -Mikayla Rios appears stable. She continues Keytruda, tolerating well with fluctuating fibromyalgia pain and recent night sweats that have improved. TSH is pending. Side effects are adequately managed with supportive care at home. She is able to  recover and function well -There is no clinical evidence of disease progression -Labs reviewed, anemia has improved. Adequate for [place] today, continue q3 weeks. She will also receive IV Iron for low ferritin at last visit. -F/up with next cycle   2. Symptom Management: Abdominal pain, Weight loss, rash -she continues to have severe pain at the incision site. She saw Dr. Johney Maine on 06/11/21, who said the pain could last up to a year due to the complexity of her surgery. -Pain management per palliative care.  Well managed on Percocet which she uses rarely -Denies pain currently    3. Anxiety and depression -Due to the prolonged illness and multiple hospital stay since May 2022, and cancer diagnosis, she has been feeling depressed lately. -cymbalta prescribed 05/25/21 -She had panic attack 2/13 after chemo, when she was reacting to clorhexidine.  -F/up Dr. Michail Sermon -Stable lately   4. 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 However her MSI was low, instead of high, which is unusual for Lynch syndrome. -FO showed MSI-High disease, and showed multiple other mutations including APC, [name]6, and BRCA mutations.  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  -We previously discussed genetic testing for her family. Half sister has been tested, is under Dr's care and is s/p total colectomy.  -Her children are 9, 13, and 30. The eldest is on the autism spectrum and low functioning/child-like. Will discuss ethics of testing with genetics.    5. Social Support -she is connected with Education officer, [employer] -she is working to apply for disability. Her job ended just prior to the start of her symptoms. -she has three children-- ages 24, 29, and 13.  PLAN: -Labs and gyn onc tumor board discussion reviewed, no further biopsies at this time -Proceed with Keytruda  and IV iron today -Discuss genetic testing with Sharrie Rothman specifically re: testing oldest son age 43 on autism spectrum -F/up and next cycle in 3 weeks   All questions were answered. The patient knows to call the clinic with any problems, questions or concerns. No barriers to learning was detected. I spent 20 minutes counseling the patient face to face. The total time spent in the appointment was 30 minutes and more than 50% was on counseling and review of test results and coordination of care.      Alla Feeling, NP 05/18/22

## 2022-05-18 NOTE — Progress Notes (Signed)
[facility] CSW Progress Note  Holiday representative met with patient to check in.  Pt reports he phone has been turned back on and the [employer] helped with additional bills above and beyond the back rent which has financially stabilized pt through the first of the year.  Pt reports she has heard from disability that she has been 90% approved and is anticipating within the next few weeks to receive final approval and will then have an income.  No additional needs at this time.  CSW to remain available as appropriate to provide support throughout duration of treatment.      Henriette Combs, LCSW

## 2022-05-18 NOTE — Patient Instructions (Signed)
[facility]ICAL [facility]COL[facility]  Discharge Instructions: Thank you for choosing [facility] to provide your oncology and hematology care.   If you have a lab appointment with the Cancer Center, please go directly to the Cancer Center and check in at the registration area.   Wear comfortable clothing and clothing appropriate for easy access to any Portacath or PICC line.   We strive to give you quality time with your provider. You may need to reschedule your appointment if you arrive late (15 or more minutes).  Arriving late affects you and other patients whose appointments are after yours.  Also, if you miss three or more appointments without notifying the office, you may be dismissed from the clinic at the provider's discretion.      For prescription refill requests, have your pharmacy contact our office and allow 72 hours for refills to be completed.    Today you received the following chemotherapy and/or immunotherapy agent: Pembrolizumab (Keytruda)   To help prevent nausea and vomiting after your treatment, we encourage you to take your nausea medication as directed.  BELOW ARE SYMPTOMS THAT SHOULD BE REPORTED IMMEDIATELY: *FEVER GREATER THAN 100.4 F (38 C) OR HIGHER *CHILLS OR SWEATING *NAUSEA AND VOMITING THAT IS NOT CONTROLLED WITH YOUR NAUSEA MEDICATION *UNUSUAL SHORTNESS OF BREATH *UNUSUAL BRUISING OR BLEEDING *URINARY PROBLEMS (pain or burning when urinating, or frequent urination) *BOWEL PROBLEMS (unusual diarrhea, constipation, pain near the anus) TENDERNESS IN MOUTH AND THROAT WITH OR WITHOUT PRESENCE OF ULCERS (sore throat, sores in mouth, or a toothache) UNUSUAL RASH, SWELLING OR PAIN  UNUSUAL VAGINAL DISCHARGE OR ITCHING   Items with * indicate a potential emergency and should be followed up as soon as possible or go to the Emergency Department if any problems should occur.  Please show the CHEMOTHERAPY ALERT CARD or IMMUNOTHERAPY ALERT CARD at  check-in to the Emergency Department and triage nurse.  Should you have questions after your visit or need to cancel or reschedule your appointment, please contact [facility]  Dept: [phone]  and follow the prompts.  Office hours are 8:00 a.m. to 4:30 p.m. Monday - Friday. Please note that voicemails left after 4:00 p.m. may not be returned until the following business day.  We are closed weekends and major holidays. You have access to a nurse at all times for urgent questions. Please call the main number to the clinic Dept: [phone] and follow the prompts.   For any non-urgent questions, you may also contact your provider using MyChart. We now offer e-Visits for anyone 18 and older to request care online for non-urgent symptoms. For details visit [email or web address].   Also download the MyChart app! Go to the app store, search "MyChart", open the app, select , and log in with your MyChart username and password.  Masks are optional in the cancer centers. If you would like for your care team to wear a mask while they are taking care of you, please let them know. You may have one support Tisa Weisel who is at least 38 years old accompany you for your appointments. Pembrolizumab Injection What is this medication? PEMBROLIZUMAB (PEM broe LIZ ue mab) treats some types of cancer. It works by helping your immune system slow or stop the spread of cancer cells. It is a monoclonal antibody. This medicine may be used for other purposes; ask your health care provider or pharmacist if you have questions. COMMON BRAND NAME(S): Keytruda What should I tell   my care team before I take this medication? They need to know if you have any of these conditions: Allogeneic stem cell transplant (uses someone else's stem cells) Autoimmune diseases, such as Crohn disease, ulcerative colitis, lupus History of chest radiation Nervous system problems, such as Guillain-Barre  syndrome, myasthenia gravis Organ transplant An unusual or allergic reaction to pembrolizumab, other medications, foods, dyes, or preservatives Pregnant or trying to get pregnant Breast-feeding How should I use this medication? This medication is injected into a vein. It is given by your care team in a hospital or clinic setting. A special MedGuide will be given to you before each treatment. Be sure to read this information carefully each time. Talk to your care team about the use of this medication in children. While it may be prescribed for children as young as 6 months for selected conditions, precautions do apply. Overdosage: If you think you have taken too much of this medicine contact a poison control center or emergency room at once. NOTE: This medicine is only for you. Do not share this medicine with others. What if I miss a dose? Keep appointments for follow-up doses. It is important not to miss your dose. Call your care team if you are unable to keep an appointment. What may interact with this medication? Interactions have not been studied. This list may not describe all possible interactions. Give your health care provider a list of all the medicines, herbs, non-prescription drugs, or dietary supplements you use. Also tell them if you smoke, drink alcohol, or use illegal drugs. Some items may interact with your medicine. What should I watch for while using this medication? Your condition will be monitored carefully while you are receiving this medication. You may need blood work while taking this medication. This medication may cause serious skin reactions. They can happen weeks to months after starting the medication. Contact your care team right away if you notice fevers or flu-like symptoms with a rash. The rash may be red or purple and then turn into blisters or peeling of the skin. You may also notice a red rash with swelling of the face, lips, or lymph nodes in your neck or under  your arms. Tell your care team right away if you have any change in your eyesight. Talk to your care team if you may be pregnant. Serious birth defects can occur if you take this medication during pregnancy and for 4 months after the last dose. You will need a negative pregnancy test before starting this medication. Contraception is recommended while taking this medication and for 4 months after the last dose. Your care team can help you find the option that works for you. Do not breastfeed while taking this medication and for 4 months after the last dose. What side effects may I notice from receiving this medication? Side effects that you should report to your care team as soon as possible: Allergic reactions--skin rash, itching, hives, swelling of the face, lips, tongue, or throat Dry cough, shortness of breath or trouble breathing Eye pain, redness, irritation, or discharge with blurry or decreased vision Heart muscle inflammation--unusual weakness or fatigue, shortness of breath, chest pain, fast or irregular heartbeat, dizziness, swelling of the ankles, feet, or hands Hormone gland problems--headache, sensitivity to light, unusual weakness or fatigue, dizziness, fast or irregular heartbeat, increased sensitivity to cold or heat, excessive sweating, constipation, hair loss, increased thirst or amount of urine, tremors or shaking, irritability Infusion reactions--chest pain, shortness of breath or trouble breathing,   feeling faint or lightheaded Kidney injury (glomerulonephritis)--decrease in the amount of urine, red or dark brown urine, foamy or bubbly urine, swelling of the ankles, hands, or feet Liver injury--right upper belly pain, loss of appetite, nausea, light-colored stool, dark yellow or brown urine, yellowing skin or eyes, unusual weakness or fatigue Pain, tingling, or numbness in the hands or feet, muscle weakness, change in vision, confusion or trouble speaking, loss of balance or  coordination, trouble walking, seizures Rash, fever, and swollen lymph nodes Redness, blistering, peeling, or loosening of the skin, including inside the mouth Sudden or severe stomach pain, bloody diarrhea, fever, nausea, vomiting Side effects that usually do not require medical attention (report to your care team if they continue or are bothersome): Bone, joint, or muscle pain Diarrhea Fatigue Loss of appetite Nausea Skin rash This list may not describe all possible side effects. Call your doctor for medical advice about side effects. You may report side effects to FDA at 1-800-FDA-1088. Where should I keep my medication? This medication is given in a hospital or clinic. It will not be stored at home. NOTE: This sheet is a summary. It may not cover all possible information. If you have questions about this medicine, talk to your doctor, pharmacist, or health care provider.  2023 Elsevier/Gold Standard (2013-02-12 00:00:00)  

## 2022-05-19 ENCOUNTER — Telehealth: Payer: Self-pay | Admitting: Hematology

## 2022-05-19 ENCOUNTER — Encounter: Payer: Self-pay | Admitting: Nurse Practitioner

## 2022-05-19 ENCOUNTER — Encounter: Payer: Self-pay | Admitting: Hematology

## 2022-05-19 NOTE — Progress Notes (Signed)
[facility]  [phone] [phone]) [phone]   Name: Mikayla Rios Date: 05/19/2022 MRN: [id number]  DOB: Aug 03, 1985  Patient Care Team: [name], MD as PCP - General (Internal Medicine) [name], MD as PCP - Cardiology (Cardiology) [name], [name] as Physician Assistant (Emergency Medicine) [name], MD as Consulting Physician (Oncology) [name], MD as Consulting Physician (General Surgery) [name], [name], MD as Consulting Physician (Neurology) [name], [name], MD as Consulting Physician (Obstetrics and Gynecology) [name], MD as Consulting Physician (Gastroenterology) [name], [name], NP as Nurse Practitioner (Nurse Practitioner)    INTERVAL HISTORY: Mikayla Rios is a 36 y.o. 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 Recent CT scan on 06/16/21 showed progressive metastatic disease. Palliative ask to see for symptom management.    SOCIAL HISTORY:     reports that she has never smoked. She has never used smokeless tobacco. She reports that she does not currently use alcohol. She reports that she does not use drugs.  ADVANCE DIRECTIVES:    CODE STATUS:   PAST MEDICAL HISTORY: Past Medical History:  Diagnosis Date   Allergy    seasonal   Anemia    Blood infection 1985   Blood transfusion without reported diagnosis    Diverticulitis    Family history of breast cancer    Family history of colon cancer    Family history of stomach cancer    Hypertension    Obesity    Sleep apnea     ALLERGIES:  is allergic to chlorhexidine gluconate, shellfish allergy, and penicillins.  MEDICATIONS:  Current Outpatient Medications  Medication Sig Dispense Refill   ALPRAZolam (XANAX) 0.25 MG tablet Take 1 tablet (0.25 mg total) by mouth at bedtime as  needed for anxiety. 30 tablet 0   amLODipine (NORVASC) 10 MG tablet TAKE 1 TABLET BY MOUTH EVERY DAY 90 tablet 3   DULoxetine (CYMBALTA) 60 MG capsule TAKE 1 CAPSULE BY MOUTH EVERY DAY 90 capsule 2   lidocaine-prilocaine (EMLA) cream Apply 1 Application topically as needed. 30 g 0   metoprolol tartrate 75 MG TABS Take 75 mg by mouth 2 (two) times daily. 90 tablet 3   oxyCODONE-acetaminophen (PERCOCET) 7.5-325 MG tablet Take 1 tablet by mouth every 8 (eight) hours as needed for severe pain. 30 tablet 0   POTASSIUM PO Take by mouth.     No current facility-administered medications for this visit.   Facility-Administered Medications Ordered in Other Visits  Medication Dose Route Frequency Provider Last Rate Last Admin   fentaNYL (SUBLIMAZE) injection   Intravenous PRN Mir, Paula Libra, MD   50 mcg at 10/02/21 1436   midazolam (VERSED) injection   Intravenous PRN Mir, Paula Libra, MD   1 mg at 10/02/21 1436    VITAL SIGNS: There were no vitals taken for this visit. There were no vitals filed for this visit.    Estimated body mass index is 39.15 kg/m as calculated from the following:   Height as of 04/27/22: '5\' 3"'$  (1.6 m).   Weight as of an earlier encounter on 05/18/22: 221 lb (100.2 kg).   PERFORMANCE STATUS (ECOG) : 1 - Symptomatic but completely ambulatory   Physical Exam General: NAD Cardiovascular:RRR Respiratory: normal breathing pattern  Abdomen: soft, tender, + bowel sounds, colostomy in place Neurological: AAO x4, mood appropriate  IMPRESSION:  I saw Mrs. Noon during  her infusion today. Kei is continues to do well. Occasional fatigue however feels this is manageable. Appetite is good. Denies nausea, vomiting, constipation, or diarrhea.   Neoplams related pain Pain is much improved. Does not require daily medications however does have pain on occasions. She is tolerating Cymbalta '60mg'$ . Ocycodone on hand as needed for severe breakthrough pain. Not taking regularly.    Will continue to closely monitor.   2. Constipation No concerns with constipation. Encouraged daily bowel regimen to prevent constipation.   3. Anxiety Xanax as needed for anxiety due to the passing of her mother and emotional stressors. Is not taking daily. Able to get better sleep. Grieving appropriately.      PLAN: Oxycodone/APAP 7.'5mg'$ /'325mg'$  every 8 hours as needed for pain. Does not require daily.  Cymbalta 60 mg twice daily Miralax daily for constipation Xanax 0.'25mg'$  as needed at bedtime for sleep/anxiety. I will plan to see her back in 4-6 weeks in collaboration with future appointments.   Patient expressed understanding and was in agreement with this plan. She also understands that She can call the clinic at any time with any questions, concerns, or complaints.      Time Total: 20 min   Visit consisted of counseling and education dealing with the complex and emotionally intense issues of symptom management and palliative care in the setting of serious and potentially life-threatening illness.Greater than 50%  of this time was spent counseling and coordinating care related to the above assessment and plan.  Alda Lea, AGPCNP-BC  Palliative Medicine Team/[facility] [place]

## 2022-05-19 NOTE — Telephone Encounter (Signed)
Spoke with patient confirming upcoming appointments  

## 2022-05-20 LAB — T4: T4, Total: 6.8 ug/dL (ref 4.5–12.0)

## 2022-05-26 DIAGNOSIS — S31109A Unspecified open wound of abdominal wall, unspecified quadrant without penetration into peritoneal cavity, initial encounter: Secondary | ICD-10-CM | POA: Diagnosis not present

## 2022-05-26 DIAGNOSIS — Z933 Colostomy status: Secondary | ICD-10-CM | POA: Diagnosis not present

## 2022-05-26 DIAGNOSIS — Z4801 Encounter for change or removal of surgical wound dressing: Secondary | ICD-10-CM | POA: Diagnosis not present

## 2022-05-26 DIAGNOSIS — Z85038 Personal history of other malignant neoplasm of large intestine: Secondary | ICD-10-CM | POA: Diagnosis not present

## 2022-05-27 ENCOUNTER — Telehealth: Payer: Self-pay | Admitting: Genetic Counselor

## 2022-05-27 NOTE — Telephone Encounter (Signed)
Called patient to offer genetics appt.  Scheduled for 1/4 at 10am with Santiago Glad.

## 2022-06-02 DIAGNOSIS — Z85038 Personal history of other malignant neoplasm of large intestine: Secondary | ICD-10-CM | POA: Diagnosis not present

## 2022-06-02 DIAGNOSIS — Z4801 Encounter for change or removal of surgical wound dressing: Secondary | ICD-10-CM | POA: Diagnosis not present

## 2022-06-02 DIAGNOSIS — S31109A Unspecified open wound of abdominal wall, unspecified quadrant without penetration into peritoneal cavity, initial encounter: Secondary | ICD-10-CM | POA: Diagnosis not present

## 2022-06-02 DIAGNOSIS — Z933 Colostomy status: Secondary | ICD-10-CM | POA: Diagnosis not present

## 2022-06-09 NOTE — Progress Notes (Unsigned)
[facility] OFFICE PROGRESS NOTE  [name], [place] [address] [address]  DIAGNOSIS: Follow up colon cancer    Oncology History Overview Note   Cancer Staging  Malignant neoplasm of sigmoid colon [facility]) Staging form: Colon and Rectum, AJCC 8th Edition - Pathologic stage from 04/15/2021: Stage IIC (pT4b, pN0, cM0) - Signed by [name], MD on 06/12/2021    Malignant neoplasm of sigmoid colon [facility])   Initial Diagnosis   Malignant neoplasm of sigmoid colon ([place])   11/04/2020 Imaging   EXAM: CT ABDOMEN AND PELVIS WITHOUT CONTRAST  IMPRESSION: 1. Perforating descending colonic diverticulitis with multiple abdominopelvic gas and fluid collections, measuring up to 5.7 cm and further described above. 2. Cholelithiasis without findings of acute cholecystitis. 3. 3.6 cm benign left adrenal adenoma. 4. Leiomyomatous uterus. 5. 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   11/07/2020 Imaging   EXAM: CT ABDOMEN AND PELVIS WITHOUT CONTRAST  IMPRESSION: Continued wall thickening is seen involving descending colon suggesting infectious or inflammatory colitis or perforated diverticulitis. There is an adjacent fluid collection measuring 7.0 x 5.0 cm consistent with abscess which is significantly enlarged compared to prior exam. Adjacent to the abscess, there is a severely thickened small bowel loop most consistent with secondary inflammation.   5.2 x 3.5 cm fluid collection is noted within the left psoas muscle consistent with abscess which is significantly enlarged compared to prior exam. 4.4 x 3.8 cm fluid collection consistent with abscess is noted in the left retroperitoneal region which is also enlarged compared to prior exam.   Fibroid uterus.   Cholelithiasis.   11/27/2020 Imaging   EXAM: CT ABDOMEN AND PELVIS WITHOUT CONTRAST  IMPRESSION: 1.  Significantly interval decreased size of the previously visualized left retroperitoneal and left anterior mid abdominal fluid collections. There is persistent fat stranding and mural thickening of the mid descending colon at this level. 2. Unchanged leiomyomatous uterus. 3. Unchanged cholelithiasis.   12/11/2020 Imaging   EXAM: CT ABDOMEN AND PELVIS WITHOUT CONTRAST  IMPRESSION: 1. Stable inflammatory changes centered around the distal descending colon in the left lower abdomen. Pericolonic inflammatory changes have minimally changed since 11/27/2020. Small pocket of gas medial to the colon is probably associated with a fistula or small residual abscess collection. 2. Stable position of the two percutaneous drains. The more anterior drain may be extending through a portion of the small bowel. 3. Cholelithiasis. 4. Fibroid uterus. Cannot exclude an ovarian/adnexal cystic structure near the uterine fundus. 5. Left adrenal adenoma.   01/07/2021 Imaging   EXAM: CT ABDOMEN AND PELVIS WITH CONTRAST  IMPRESSION: 1. No new abscesses identified. Similar degree of soft tissue thickening seen in the left pericolonic region. The degree of persistent soft tissues thickening in the pericolonic space further raises suspicions for malignancy. Further evaluation with colonoscopy should be performed. 2. Left retroperitoneal abscess drain unchanged in position. Anterior left abdominal drain again seen terminating within small bowel loop. 3. 2.6 cm mildly sclerotic lesion noted in the L2 vertebral body. Further evaluation with contrast enhanced lumbar spine MRI should be performed.   02/04/2021 Imaging   EXAM: CT ABDOMEN AND PELVIS WITH CONTRAST  IMPRESSION: No new abdominopelvic collections or abscess development in the 1 month interval.   Left anterior drain remains within a loop of small bowel, unchanged.   Left lateral abscess drain remains in the retroperitoneal space adjacent to the  iliopsoas  muscle with a small amount of residual fluid and air but no measurable collection.   Stable soft tissue prominence and pericolonic strandy edema/inflammation about the left descending colon compatible with residual diverticulitis/colitis. Difficult to exclude underlying transmural lesion.   04/01/2021 Imaging   EXAM: CT ABDOMEN AND PELVIS WITH CONTRAST  IMPRESSION: There appears to be significant enhancement and wall thickening involving the descending colon with some degree of traction and involvement of adjacent small bowel loops. This is consistent with the history of diverticulitis and perforation. Stable position of percutaneous drainage catheter is seen adjacent to left psoas muscle with no significant residual fluid remaining. The other percutaneous drainage catheter that was previously noted to be within small bowel loop on prior exam in the left lower quadrant, has significantly retracted and appears to be outside of the peritoneal space at this time.   Since the prior exam, there does appear to be some degree of rotation involving mesenteric vessels and structures in the right lower quadrant, suggesting partial volvulus or malrotation. There is the interval development of several lymph nodes in this area, most likely inflammatory or reactive in etiology. Mild amount of free fluid is also noted in the pelvis. However, no significant bowel wall thickening or dilatation is seen in this area. These results will be called to the ordering clinician or representative by the Radiologist Assistant, and communication documented in the PACS or zVision Dashboard.   Stable uterine fibroid.   Hepatic steatosis.   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   Cholelithiasis.   04/10/2021 Imaging   EXAM: CT ANGIOGRAPHY CHEST CT ABDOMEN AND PELVIS WITH CONTRAST  IMPRESSION: 1. Again seen are findings compatible with descending colon diverticulitis with perforation. Free air and  inflammation have increased in the interval. 2. New lobulated enhancing fluid collection posterior to this segment of inflamed colon measuring 8.5 x 3.5 x 10.0 cm. This collection now invades the adjacent iliopsoas muscle as well as extends through the left lateral abdominal wall. The tip of the drainage catheter is in this collection. Findings are compatible with abscess. 3. Anterior left percutaneous drainage catheter tip has been pulled back and is now within the subcutaneous tissues. 4. Trace free fluid. 5. No pulmonary embolism.  No acute cardiopulmonary process. 6. Cholelithiasis. 7. Fatty infiltration of the liver.   04/15/2021 Definitive Surgery   FINAL MICROSCOPIC DIAGNOSIS:   A. SMALL BOWEL, RESECTION:  - Adenocarcinoma.  - No carcinoma identified in 1 lymph node.   B. PERFORATED LEFT COLON, RESECTION:  - Moderately differentiated colonic adenocarcinoma.  - Tumor extends into pericolonic adipose tissue, and is strongly  suggestive of invasion into small bowel.  See oncology table/comments.  - No carcinoma identified in 4 lymph nodes (0/4).  - Tubular adenoma with high-grade dysplasia, 1.  - Tubular adenomas with low grade dysplasia, 3.   Comments: The size of the tumor is difficult to estimate secondary to the disrupted nature of the specimen, as well as the infiltrative nature of the tumor.  Tumor can be identified as definitely invading into the pericolonic adipose tissue (block B4), but given the extreme disruption of the tissue, and the involvement of the small bowel by what appears to be a colonic adenocarcinoma, I favor perforation of the large bowel with direct invasion into the small bowel.  Accordingly, I believe this is best regarded as a pT4b lesion.   ADDENDUM:  Mismatch Repair Protein (IHC)  SUMMARY INTERPRETATION: ABNORMAL  There is loss of the major MMR protein [name]6:  This indicates a high probability that a hereditary germline mutation is present and  referral to genetic counseling is warranted. It is recommended that the loss of protein expression be correlated with molecular based microsatellite instability testing.   IHC EXPRESSION RESULTS  TEST           RESULT  MLH1:          Preserved nuclear expression  [name]2:          Preserved nuclear expression  [name]6:          LOSS OF NUCLEAR EXPRESSION  PMS2:          Preserved nuclear expression    04/15/2021 Cancer Staging   Staging form: Colon and Rectum, AJCC 8th Edition - Pathologic stage from 04/15/2021: Stage IIC (pT4b, pN0, cM0) - Signed by [name], MD on 06/12/2021 Stage prefix: Initial diagnosis Total positive nodes: 0 Histologic grading system: 4 grade system Histologic grade (G): G2 Residual tumor (R): R0 - None   04/29/2021 Imaging   EXAM: CT ABDOMEN AND PELVIS WITH CONTRAST  IMPRESSION: Continued left retroperitoneal abscesses inferior to the left kidney, involving the left psoas muscle and left abdominal wall musculature. Overall size is decreased since prior study. Interval removal of left lower quadrant abscess drainage catheter.   New fluid collection in the cul-de-sac and wrapping around the uterus concerning for abscess.   Cholelithiasis.   Small left pleural effusion.  Bibasilar atelectasis.   Stable left adrenal adenoma.  ADDENDUM: After discussing the case with Dr. Dwaine Gale in interventional radiology, it was noted that the fluid in the pelvis originally thought to be in the cul-de-sac is likely within the vagina, best seen on sagittal imaging. Recommend speculum exam.   Also, in the left lateral wall in the area of prior abscess in the lateral wall abdominal musculature, some of the abdomen soft tissue appears to be enhancing. While this could be infectious, cannot exclude tumor seeding in the left lateral abdominal wall, measuring 6.9 x 3.8 cm on image 64 of series 2.   06/16/2021 Imaging   EXAM: CT ABDOMEN AND PELVIS WITH  CONTRAST  IMPRESSION: Increased size of bulky soft tissue masses involving the left psoas muscle and left lateral abdominal wall soft tissues, consistent with progressive metastatic disease.   Significant progression of multiple peritoneal masses throughout the abdomen and pelvis, consistent with peritoneal carcinomatosis.   New mild retroperitoneal lymphadenopathy, consistent with metastatic disease.   Stable uterine fibroid and left adrenal adenoma.   Cholelithiasis, without evidence of cholecystitis.   06/22/2021 - 08/19/2021 Chemotherapy   Patient is on Treatment Plan : COLORECTAL FOLFOX q14d x 6 months     06/22/2021 Tumor Marker   Patient's tumor was tested for the following markers: CEA. Results of the tumor marker test revealed 87.41.   07/10/2021 Pathology Results   FINAL MICROSCOPIC DIAGNOSIS:   A. PERITONEAL MASS, LEFT UPPER ABDOMINAL QUADRANT, BIOPSY:  -  Metastatic adenocarcinoma with necrosis, histologically consistent with colon primary.     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 The report date is 07/16/2021. The remainder of testing was negative/normal.  The CancerNext-Expanded + RNAinsight gene panel offered by [facility] and includes sequencing and rearrangement analysis for the following 77 genes: IP, ALK, APC*, ATM*, AXIN2, BAP1, BARD1, BLM, BMPR1A, BRCA1*, BRCA2*, BRIP1*, CDC73, CDH1*,CDK4, CDKN1B, CDKN2A, CHEK2*, CTNNA1, DICER1, FANCC, FH, FLCN, GALNT12, KIF1B, LZTR1, MAX, MEN1, MET, MLH1*, [name]2*, [name]3, [name]6*, MUTYH*, NBN, NF1*,  NF2, NTHL1, PALB2*, PHOX2B, PMS2*, POT1, PRKAR1A, PTCH1, PTEN*, RAD51C*, RAD51D*,RB1, RECQL, RET, SDHA, SDHAF2, SDHB, SDHC, SDHD, SMAD4, SMARCA4, SMARCB1, SMARCE1, STK11, SUFU, TMEM127, TP53*,TSC1, TSC2, VHL and XRCC2 (sequencing and deletion/duplication); EGFR, EGLN1, HOXB13, KIT, MITF, PDGFRA, POLD1 and POLE (sequencing only); EPCAM and GREM1  (deletion/duplication only).   08/28/2021 Imaging   EXAM: CT CHEST, ABDOMEN, AND PELVIS WITH CONTRAST  IMPRESSION: 1. Continued interval progression of multiple metastatic soft tissue nodules/masses in the abdomen and pelvis. 2. No evidence for metastatic disease in the chest. 3. Similar appearance of the subtle lesion in the L2 vertebral body, suspicious for metastatic involvement. 4. Cholelithiasis.   09/03/2021 -  Chemotherapy   Patient is on Treatment Plan : COLORECTAL Pembrolizumab (200) q21d     09/08/2021 - 02/01/2022 Chemotherapy   Patient is on Treatment Plan : COLORECTAL Pembrolizumab (200) q21d     01/08/2022 Imaging   CT CAP IMPRESSION: 1. Mixed response to therapy. Interval decrease in size of the conglomerate multilobulated mass extending from the left psoas into the left posterior pararenal space through the left lateral abdominal wall along the tract of the previously placed percutaneous drainage catheter. Interval decrease in size of right adnexal mass and omental masses within the right lower quadrant of the abdomen. Interval development of peritoneal carcinomatosis with omental caking within the left lower quadrant of the abdomen and left subdiaphragmatic region adjacent to the spleen and along the left pericolic gutter as well as development of ascites appearing loculated within the anterior peritoneum. 2. Interval development of mild left hydronephrosis secondary to stricturing of the mid left ureter. 3. Stable left adrenal metastasis. 4. Stable sclerotic lesion within the L2 vertebral body. No new lytic or blastic bone lesions are seen. 5. Cholelithiasis. 6. No evidence of intrathoracic metastatic disease. 7. 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 Moderate colonic stool burden. No evidence of obstruction.   03/15/2022 Imaging   IMPRESSION: 1. Status post sigmoid colon resection with left  lower quadrant end colostomy. 2. Multicystic bilateral ovarian lesions are slightly increased in size. 3. Diminished, small volume of loculated appearing ascites throughout the abdomen and pelvis, with similar appearance of peritoneal thickening and nodularity throughout. 4. Heterogeneously calcified, conglomerate masses in the left retroperitoneum and abdominal wall are slightly diminished in size. 5. Left adrenal lesion not significantly changed. 6. Unchanged faintly sclerotic metastatic lesion of L2. 7. Constellation of findings is consistent with mixed response to treatment however with overall little interval change. 8. Minimal residual left hydronephrosis and proximal hydroureter, improved compared to prior examination. 9. Cholelithiasis. 10. Coronary artery disease, significantly advanced for patient age.   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 female returns to the clinic today for a follow-up visit.  The patient was last seen 3 weeks ago by my colleague Lacie.  The patient is currently undergoing systemic treatment with Keytruda IV every 3 weeks and she tolerates it well without any concerning adverse side effects except it sometimes flares of her joint pain secondary to her fibromyalgia, although she appears to be doing well recently. She also follows closely with palliative care. She is rarely needing to  take her medications for her supportive care. She is prescribed oxycodone every 8 hours PRN which she typically does not need to take. She does not like taking this unless needed because it makes her feel "cloudy". Because she is not taking a lot of this, she is not needing to  take miralax. She also has not needed to take xanax. She was prescribed it a few months ago to help with sleep and anxiety after her mother passed away for metastatic colorectal cancer. She still takes Cymbalta for her fibromyalgia.   She had an appointment today with genetic counseling.   She also had an IV iron infusion on 05/18/22. She cannot tolerate p.o. iron supplements, therefore, she takes prenatal gummies which she tolerates well. She is wondering if she will need another iron infusion. Her ferritin is pending from today.   Denies any fever, chills, or night sweats. Her appetite seems to have improved. She gained weight since last being seen. She denies any chest pain, cough, hemoptysis, or shortness of breath.  Denies any rashes or skin changes except she sometimes has itching near her ostomy site. She denies hemoptysis or shortness of breath. Denies any abdominal pain.  Denies any nausea, vomiting, diarrhea, or constipation.  Denies any blood in the stool.  Denies any jaundice or itching.  She is here today for evaluation and repeat blood work before undergoing her next cycle of treatment with [place].   MEDICAL HISTORY: Past Medical History:  Diagnosis Date   Allergy    seasonal   Anemia    Blood infection 1985   Blood transfusion without reported diagnosis    Diverticulitis    Family history of breast cancer    Family history of colon cancer    Family history of stomach cancer    Hypertension    Obesity    Sleep apnea     ALLERGIES:  is allergic to chlorhexidine gluconate, shellfish allergy, and penicillins.  MEDICATIONS:  Current Outpatient Medications  Medication Sig Dispense Refill   ALPRAZolam (XANAX) 0.25 MG tablet Take 1 tablet (0.25 mg total) by mouth at bedtime as needed for anxiety. 30 tablet 0   amLODipine (NORVASC) 10 MG tablet TAKE 1 TABLET BY MOUTH EVERY DAY 90 tablet 3   DULoxetine (CYMBALTA) 60 MG capsule TAKE 1 CAPSULE BY MOUTH EVERY DAY 90 capsule 2    lidocaine-prilocaine (EMLA) cream Apply 1 Application topically as needed. 30 g 0   metoprolol tartrate 75 MG TABS Take 75 mg by mouth 2 (two) times daily. 90 tablet 3   oxyCODONE-acetaminophen (PERCOCET) 7.5-325 MG tablet Take 1 tablet by mouth every 8 (eight) hours as needed for severe pain. 30 tablet 0   POTASSIUM PO Take by mouth.     No current facility-administered medications for this visit.   Facility-Administered Medications Ordered in Other Visits  Medication Dose Route Frequency Provider Last Rate Last Admin   fentaNYL (SUBLIMAZE) injection   Intravenous PRN Mir, Paula Libra, MD   50 mcg at 10/02/21 1436   midazolam (VERSED) injection   Intravenous PRN Mir, Paula Libra, MD   1 mg at 10/02/21 1436    SURGICAL HISTORY:  Past Surgical History:  Procedure Laterality Date   COLECTOMY WITH COLOSTOMY CREATION/HARTMANN PROCEDURE N/A 04/15/2021   Procedure: COLOSTOMY CREATION/HARTMANN PROCEDURE;  Surgeon: [name], MD;  Location: [facility];  Service: General;  Laterality: N/A;   IR CATHETER TUBE CHANGE  12/12/2020   IR CATHETER TUBE CHANGE  01/27/2021   IR CATHETER  TUBE CHANGE  02/19/2021   IR CATHETER TUBE CHANGE  04/13/2021   IR IMAGING GUIDED PORT INSERTION  10/02/2021   IR RADIOLOGIST EVAL & MGMT  11/27/2020   IR RADIOLOGIST EVAL & MGMT  12/11/2020   IR RADIOLOGIST EVAL & MGMT  01/07/2021   IR RADIOLOGIST EVAL & MGMT  02/04/2021   IR SINUS/FIST TUBE CHK-NON GI  12/12/2020   IR SINUS/FIST TUBE CHK-NON GI  02/19/2021   LAPAROSCOPIC PARTIAL COLECTOMY N/A 04/15/2021   Procedure: LAPAROSCOPIC ASSISTED HARTMANN RESECTION;  Surgeon: [name], MD;  Location: [facility];  Service: General;  Laterality: N/A;    REVIEW OF SYSTEMS:   Review of Systems  Constitutional: Negative for appetite change, chills, fatigue, fever and unexpected weight change.  HENT: Negative for mouth sores, nosebleeds, sore throat and trouble swallowing.   Eyes: Negative for eye problems and icterus.  Respiratory:  Negative for cough, hemoptysis, shortness of breath and wheezing.   Cardiovascular: Negative for chest pain and leg swelling.  Gastrointestinal: Positive for occasional irritation/itching near ostomy site. Negative for abdominal pain, constipation, diarrhea, nausea and vomiting.  Genitourinary: Negative for bladder incontinence, difficulty urinating, dysuria, frequency and hematuria.   Musculoskeletal: Negative for back pain, gait problem, neck pain and neck stiffness.  Skin: Negative for itching and rash.  Neurological: Negative for dizziness, extremity weakness, gait problem, headaches, light-headedness and seizures.  Hematological: Negative for adenopathy. Does not bruise/bleed easily.  Psychiatric/Behavioral: Negative for confusion, depression and sleep disturbance. The patient is not nervous/anxious.     PHYSICAL EXAMINATION:  There were no vitals taken for this visit.  ECOG PERFORMANCE STATUS: 1  Physical Exam  Constitutional: Oriented to person, place, and time and well-developed, well-nourished, and in no distress.  HENT:  Head: Normocephalic and atraumatic.  Mouth/Throat: Oropharynx is clear and moist. No oropharyngeal exudate.  Eyes: Conjunctivae are normal. Right eye exhibits no discharge. Left eye exhibits no discharge. No scleral icterus.  Neck: Normal range of motion. Neck supple.  Cardiovascular: Normal rate, regular rhythm, normal heart sounds and intact distal pulses.   Pulmonary/Chest: Effort normal and breath sounds normal. No respiratory distress. No wheezes. No rales.  Abdominal: Soft. Bowel sounds are normal. Exhibits no distension and no mass. There is no tenderness.  ostomy noted. Scars noted on abdomen. Musculoskeletal: Normal range of motion. Exhibits no edema.  Lymphadenopathy:    No cervical adenopathy.  Neurological: Alert and oriented to person, place, and time. Exhibits normal muscle tone. Gait normal. Coordination normal.  Skin: Skin is warm and dry. No  rash noted. Not diaphoretic. No erythema. No pallor.  Psychiatric: Mood, memory and judgment normal.  Vitals reviewed.  LABORATORY DATA: Lab Results  Component Value Date   WBC 5.5 05/18/2022   HGB 10.8 (L) 05/18/2022   HCT 33.3 (L) 05/18/2022   MCV 82.8 05/18/2022   PLT 395 05/18/2022      Chemistry      Component Value Date/Time   NA 139 05/18/2022 1243   NA 139 12/21/2019 1024   K 3.6 05/18/2022 1243   CL 107 05/18/2022 1243   CO2 25 05/18/2022 1243   BUN 15 05/18/2022 1243   BUN 13 12/21/2019 1024   CREATININE 0.71 05/18/2022 1243      Component Value Date/Time   CALCIUM 9.1 05/18/2022 1243   ALKPHOS 81 05/18/2022 1243   AST 12 (L) 05/18/2022 1243   ALT 11 05/18/2022 1243   BILITOT 0.2 (L) 05/18/2022 1243       RADIOGRAPHIC STUDIES:  No results found.   ASSESSMENT/PLAN:  Mikayla Rios is a 37 y.o. female with    1. 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 She was found to have diverticulitis with perforation. She also developed an abscess requiring 2 drains. She continued to have abdominal pain and drainage, undergoing drain exchange three times. -she developed worsening weakness and was admitted on 04/10/21. She was taken for emergent small bowel and left colon resection on 04/15/21 under Dr. Johney Maine. 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 Margins negative for invasive carcinoma, but low-grade dysplasia involves proximal margin. All 5 lymph nodes negative (0/5). MSI low. -repeat CT AP on 06/16/21 showed progressive metastatic disease involving soft tissue masses in left psoas muscle and left lateral abdominal wall and peritoneal masses, and new mild retroperitoneal lymphadenopathy.  -baseline CEA on 06/22/21 was elevated at 87.41. -biopsy of the LUQ peritoneal mass on  07/10/21 confirmed metastatic adenocarcinoma with necrosis, consistent with colon primary. -FO showed MSI-H disease, BRCA, [name]6, and APC mutations among others which are not targetable. 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  Due to BRCA mutation on FO, she may be a candidate for PARP inhibitor down the line -she progressed on first line FOLFOX (06/22/21 - 08/17/21) and switch to second line pembrolizumab q. 21 days starting 09/08/2021 -Restaging CT CAP from 01/08/2022 decreased size of the mass involving the left psoas muscle, improved omental and ovarian deposits, with slightly increased omental caking in the left lower abdomen.  No new metastatic disease.  Overall consistent with a partial treatment response.  She has improved clinically and CEA normalized on immunotherapy -CT 03/15/28 again showed mixed response, and PET with multiple findings overall indeterminate for mixed response to [place] vs secondary malignancy but gyn/onc felt less likely. No further gyn biopsies are recommended at this time Regan Rakers reviewed this with her on 05/18/22) -Ms. Havlicek appears stable. She continues Keytruda, tolerating well with fluctuating fibromyalgia pain.  TSH is pending. Side effects are adequately managed with supportive care at home. She is able to recover and function well -Labs reviewed, anemia is stable but improved compared to the last few months. Adequate for [place] today, continue q3 weeks. She will also receive IV Iron for low ferritin at last visit 05/18/22. Ferritin pending today. She is taking prenatal iron gummie -F/up with next cycle    2. Symptom Management: Abdominal pain, Weight loss, rash -she continues to have severe pain at the incision site. She saw Dr. Johney Maine on 06/11/21, who said the pain could last up to a year due to the complexity of her surgery. -Pain management per palliative care.  Well managed on Percocet which she  uses rarely -Denies pain currently. Only taking Cymbalta for fibromyalgia.  -Appetite improving. Gained weight since last being seen.    3. Anxiety and depression -Due to the prolonged illness and multiple hospital stay since May 2022, and cancer diagnosis, she has been feeling depressed lately. -cymbalta prescribed 05/25/21 -She had panic attack 2/13 after chemo, when she was reacting to clorhexidine.  -F/up Dr. Michail Sermon -Stable lately   4. 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 However her MSI was low, instead of high, which is unusual for [employer]  syndrome. -FO showed MSI-High disease, and showed multiple other mutations including APC, [name]6, and BRCA mutations.  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  -We previously discussed genetic testing for her family. Half sister has been tested, is under Dr's care and is s/p total colectomy.  -Her children are 9, 13, and 20. The eldest is on the autism spectrum and low functioning/child-like. Was referred to genetic counseling.  -She had an appointment with genetic counseling today 06/10/22   5. Social Support -she is connected with Education officer, museum -she is working to apply for disability. Her job ended just prior to the start of her symptoms. -she has three children-- ages 64, 30, and 22.     PLAN: -Proceed with Keytruda as scheduled if CMP within parameters -F/up and next cycle in 3 weeks -Ferritin pending -Met with genetic counseling today   No orders of the defined types were placed in this encounter.     The total time spent in the appointment was 20-29 minutes.   Parag Dorton L Emmett Bracknell, PA-C 06/09/22

## 2022-06-10 ENCOUNTER — Inpatient Hospital Stay (HOSPITAL_BASED_OUTPATIENT_CLINIC_OR_DEPARTMENT_OTHER): Payer: BC Managed Care – PPO | Admitting: Genetic Counselor

## 2022-06-10 ENCOUNTER — Other Ambulatory Visit: Payer: Self-pay

## 2022-06-10 ENCOUNTER — Inpatient Hospital Stay: Payer: BC Managed Care – PPO | Attending: Hematology | Admitting: Physician Assistant

## 2022-06-10 ENCOUNTER — Inpatient Hospital Stay: Payer: BC Managed Care – PPO

## 2022-06-10 ENCOUNTER — Encounter: Payer: Self-pay | Admitting: Genetic Counselor

## 2022-06-10 DIAGNOSIS — Z1379 Encounter for other screening for genetic and chromosomal anomalies: Secondary | ICD-10-CM

## 2022-06-10 DIAGNOSIS — C7972 Secondary malignant neoplasm of left adrenal gland: Secondary | ICD-10-CM | POA: Diagnosis not present

## 2022-06-10 DIAGNOSIS — R222 Localized swelling, mass and lump, trunk: Secondary | ICD-10-CM | POA: Insufficient documentation

## 2022-06-10 DIAGNOSIS — C187 Malignant neoplasm of sigmoid colon: Secondary | ICD-10-CM

## 2022-06-10 DIAGNOSIS — K8020 Calculus of gallbladder without cholecystitis without obstruction: Secondary | ICD-10-CM | POA: Diagnosis not present

## 2022-06-10 DIAGNOSIS — K760 Fatty (change of) liver, not elsewhere classified: Secondary | ICD-10-CM | POA: Insufficient documentation

## 2022-06-10 DIAGNOSIS — D259 Leiomyoma of uterus, unspecified: Secondary | ICD-10-CM | POA: Insufficient documentation

## 2022-06-10 DIAGNOSIS — Z803 Family history of malignant neoplasm of breast: Secondary | ICD-10-CM | POA: Insufficient documentation

## 2022-06-10 DIAGNOSIS — D509 Iron deficiency anemia, unspecified: Secondary | ICD-10-CM | POA: Diagnosis not present

## 2022-06-10 DIAGNOSIS — C786 Secondary malignant neoplasm of retroperitoneum and peritoneum: Secondary | ICD-10-CM

## 2022-06-10 DIAGNOSIS — K5720 Diverticulitis of large intestine with perforation and abscess without bleeding: Secondary | ICD-10-CM | POA: Diagnosis not present

## 2022-06-10 DIAGNOSIS — J90 Pleural effusion, not elsewhere classified: Secondary | ICD-10-CM | POA: Insufficient documentation

## 2022-06-10 DIAGNOSIS — C7951 Secondary malignant neoplasm of bone: Secondary | ICD-10-CM | POA: Insufficient documentation

## 2022-06-10 DIAGNOSIS — Z9049 Acquired absence of other specified parts of digestive tract: Secondary | ICD-10-CM | POA: Insufficient documentation

## 2022-06-10 DIAGNOSIS — D1391 Familial adenomatous polyposis: Secondary | ICD-10-CM

## 2022-06-10 DIAGNOSIS — Z1509 Genetic susceptibility to other malignant neoplasm: Secondary | ICD-10-CM | POA: Diagnosis not present

## 2022-06-10 DIAGNOSIS — Z452 Encounter for adjustment and management of vascular access device: Secondary | ICD-10-CM

## 2022-06-10 DIAGNOSIS — Z5112 Encounter for antineoplastic immunotherapy: Secondary | ICD-10-CM | POA: Insufficient documentation

## 2022-06-10 DIAGNOSIS — Z800 Family history of malignant neoplasm of digestive organs: Secondary | ICD-10-CM

## 2022-06-10 DIAGNOSIS — Z79899 Other long term (current) drug therapy: Secondary | ICD-10-CM | POA: Diagnosis not present

## 2022-06-10 DIAGNOSIS — Z933 Colostomy status: Secondary | ICD-10-CM | POA: Diagnosis not present

## 2022-06-10 DIAGNOSIS — D649 Anemia, unspecified: Secondary | ICD-10-CM

## 2022-06-10 DIAGNOSIS — I2510 Atherosclerotic heart disease of native coronary artery without angina pectoris: Secondary | ICD-10-CM | POA: Insufficient documentation

## 2022-06-10 LAB — IRON AND IRON BINDING CAPACITY (CC-WL,HP ONLY)
Iron: 54 ug/dL (ref 28–170)
Saturation Ratios: 17 % (ref 10.4–31.8)
TIBC: 314 ug/dL (ref 250–450)
UIBC: 260 ug/dL (ref 148–442)

## 2022-06-10 LAB — CMP (CANCER CENTER ONLY)
ALT: 12 U/L (ref 0–44)
AST: 14 U/L — ABNORMAL LOW (ref 15–41)
Albumin: 4 g/dL (ref 3.5–5.0)
Alkaline Phosphatase: 73 U/L (ref 38–126)
Anion gap: 5 (ref 5–15)
BUN: 15 mg/dL (ref 6–20)
CO2: 26 mmol/L (ref 22–32)
Calcium: 8.9 mg/dL (ref 8.9–10.3)
Chloride: 108 mmol/L (ref 98–111)
Creatinine: 0.67 mg/dL (ref 0.44–1.00)
GFR, Estimated: >60 mL/min (ref >60)
Glucose, Bld: 81 mg/dL (ref 70–99)
Potassium: 4.1 mmol/L (ref 3.5–5.1)
Sodium: 139 mmol/L (ref 135–145)
Total Bilirubin: 0.2 mg/dL — ABNORMAL LOW (ref 0.3–1.2)
Total Protein: 6.7 g/dL (ref 6.5–8.1)

## 2022-06-10 LAB — CBC WITH DIFFERENTIAL (CANCER CENTER ONLY)
Abs Immature Granulocytes: 0.01 10*3/uL (ref 0.00–0.07)
Basophils Absolute: 0 10*3/uL (ref 0.0–0.1)
Basophils Relative: 0 %
Eosinophils Absolute: 0.1 10*3/uL (ref 0.0–0.5)
Eosinophils Relative: 2 %
HCT: 32.5 % — ABNORMAL LOW (ref 36.0–46.0)
Hemoglobin: 10.8 g/dL — ABNORMAL LOW (ref 12.0–15.0)
Immature Granulocytes: 0 %
Lymphocytes Relative: 31 %
Lymphs Abs: 1.6 10*3/uL (ref 0.7–4.0)
MCH: 27.3 pg (ref 26.0–34.0)
MCHC: 33.2 g/dL (ref 30.0–36.0)
MCV: 82.1 fL (ref 80.0–100.0)
Monocytes Absolute: 0.3 10*3/uL (ref 0.1–1.0)
Monocytes Relative: 6 %
Neutro Abs: 3.2 10*3/uL (ref 1.7–7.7)
Neutrophils Relative %: 61 %
Platelet Count: 358 10*3/uL (ref 150–400)
RBC: 3.96 MIL/uL (ref 3.87–5.11)
RDW: 15.8 % — ABNORMAL HIGH (ref 11.5–15.5)
WBC Count: 5.2 10*3/uL (ref 4.0–10.5)
nRBC: 0 % (ref 0.0–0.2)

## 2022-06-10 LAB — PREGNANCY, URINE: Preg Test, Ur: NEGATIVE

## 2022-06-10 LAB — CEA (IN HOUSE-CHCC): CEA (CHCC-In House): <1 ng/mL (ref 0.00–5.00)

## 2022-06-10 LAB — FERRITIN: Ferritin: 26 ng/mL (ref 11–307)

## 2022-06-10 MED ORDER — SODIUM CHLORIDE 0.9% FLUSH
10.0000 mL | Freq: Once | INTRAVENOUS | Status: AC
  Administered 2022-06-10: 10 mL

## 2022-06-10 MED ORDER — SODIUM CHLORIDE 0.9% FLUSH
10.0000 mL | INTRAVENOUS | Status: DC | PRN
  Administered 2022-06-10: 10 mL

## 2022-06-10 MED ORDER — HEPARIN SOD (PORK) LOCK FLUSH 100 UNIT/ML IV SOLN
500.0000 [IU] | Freq: Once | INTRAVENOUS | Status: AC | PRN
  Administered 2022-06-10: 500 [IU]

## 2022-06-10 MED ORDER — SODIUM CHLORIDE 0.9 % IV SOLN: Freq: Once | INTRAVENOUS | Status: AC

## 2022-06-10 MED ORDER — SODIUM CHLORIDE 0.9 % IV SOLN
200.0000 mg | Freq: Once | INTRAVENOUS | Status: AC
  Administered 2022-06-10: 200 mg via INTRAVENOUS
  Filled 2022-06-10: qty 200

## 2022-06-10 NOTE — Progress Notes (Signed)
REFERRING PROVIDER: [name], MD [address] [place],  [place] [address]  PRIMARY PROVIDER:  [name], MD  PRIMARY REASON FOR VISIT:  1. Family history of breast cancer   2. Family history of colon cancer   3. Family history of stomach cancer   4. FAP (familial adenomatous polyposis)   5. Genetic testing   6. Malignant neoplasm of sigmoid colon ([place])     GENETIC TEST RESULTS   Patient Name: Mikayla Rios Patient Age: 37 y.o. Encounter Date: 06/10/2022  HPI: Mikayla Rios was previously seen in the [facility] due to a family of cancer and concerns regarding a hereditary predisposition to cancer. Please refer to our prior cancer genetics clinic note for more information regarding Mikayla Rios's medical, social and family histories, and our assessment and recommendations, at the time. Mikayla Rios, as were recommendations warranted by these results. These results and recommendations are discussed in more detail below.   FAMILY HISTORY:  We obtained a detailed, 4-generation family history.  Significant diagnoses are listed below: Family History  Problem Relation Age of Onset   Diabetes Mother    Hypertension Mother    Colon cancer Mother        dx at age 42   Breast cancer Mother    Congestive Heart Failure Father    Hypertension Father    Diabetes Maternal Grandmother    Hypertension Maternal Grandmother    Diabetes Maternal Grandfather    Kidney disease Maternal Grandfather    Diabetes Paternal Grandmother    Autism Son    Stomach cancer Maternal Uncle    Heart disease Paternal Aunt    Colon polyps Half-Sister        colectomy   Ovarian cancer Neg Hx    Endometrial cancer Neg Hx    Pancreatic cancer Neg Hx    Prostate cancer Neg Hx     Mikayla Rios (8 and 66) and 1 daughter (73). She has 1 maternal half sister (57) who has had a colectomy for polyps, no cancer.    Ms. Matusik's  mother had breast cancer in Rios late 56's and was diagnosed with colon cancer about two years ago at 8. She had genetic testing that was reportedly positive for APC gene/FAP, report not available at today's visit. Patient had 6 maternal aunts/uncles. One uncle died of stomach cancer in his 70's-50's. No known cancers in cousins. Maternal grandmother is living in Rios 37's. Grandfather died of heart issues over the age of 61.   Mikayla Rios's father is living at 72. Patient has 10 paternal aunts/uncles. One uncle had colon cancer in his 35's. No known cancers in cousins. Paternal grandmother died over 10. Grandfather had colon cancer and died over 55.    Mikayla Rios is aware of previous family history of genetic testing for hereditary cancer risks. There is no reported Ashkenazi Jewish ancestry. There is no known consanguinity.       GENETIC TESTING: At the time of Mikayla Rios's visit on June 29, 2021, we recommended she pursue genetic testing. 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 Results were attempted to be called out in January 2023 but we were unable to reach the patient.  Patient was re-referred to discuss  genetic testing results and recommendations for Rios children.  A copy of the test report has been scanned into Epic for review.     Clinical condition Familial adenomatous polyposis (FAP) is a genetic condition that is characterized by the presence of greater than 100 adenomatous colon polyps. On average, individuals with FAP develop polyps at 40 years of age, with 95% of individuals developing polyps by age 29. The risk of developing colon cancer is from 43% to theoretically 100%, if colectomy is not performed (PMID: 14431540, 08676195, 0932671). The  average age of colon cancer diagnosis is 87 years, with 93% of untreated individuals diagnosed by age 14 (PMID: 24580998).  FAP may increase the risk of developing other types of non-colonic cancers: 4-12% risk of cancer of the duodenum (PMID: 33825053, 97673419) 1-12% risk of papillary thyroid cancer (PMID: 37902409) <1% risk of stomach cancer (PMID: 73532992) <1% risk of brain/central nervous system malignancies (PMID: 42683419) 1-2% risk of hepatoblastoma in children (PMID: 6222979, 8921194) increased risk of pancreatic cancer (PMID: 17408144, 81856314, 9702637)  Extra-colonic findings can include extra or missing teeth, desmoid tumors, osteomas, epidermoid cysts, fibromas, and congenital hyperplasia of the retinal pigment epithelium (CHRPE), which is a specific finding that requires a specialized eye examination to identify (PMID: 85885027).  Inheritance FAP has autosomal dominant inheritance. This means that an individual with a pathogenic variant has a 50% chance of passing the condition on to their offspring. It is now possible to identify at-risk relatives who can pursue testing for this specific familial variant. While many cases are inherited from a parent, some occur spontaneously (PMID: ([id number]). This means that an individual with a pathogenic variant has parents who do not have it. However, that individual now has a 50% risk of passing it on to future offspring.  Management Medical management and surveillance protocols have been developed by the [place] (NCCN) for individuals with FAP ([place], Clinical practice guidelines in oncology. Genetic/Familial High Risk Assessment: Colorectal). These recommendations apply to individuals diagnosed with FAP based on clinical findings or genetic test results, as well as individuals at an increased risk based on family history who have not had negative genetic testing:  FAP   Colon: Annual sigmoidoscopy/colonoscopy beginning at 17-57 years of age. A colectomy or proctocolectomy is recommended after numerous polyps are detected, due to their high malignant potential.  Total proctocolectomy with ileal pouch-anal anastomosis (IPAA) is generally the recommended surgical approach for individuals with FAP. If colectomy with ileorectal anastomosis (IRA) is performed, endoscopic evaluation of the remaining rectum is recommended every 6-12 months depending on polyp burden. If total proctocolectomy with IPAA is performed, endoscopic evaluation of the ileal pouch or ileostomy is recommended every 1-3 years depending on polyp burden.  If large, flat polyps with villous histology or polyps with high-grade dysplasia are identified, then surveillance frequency should be every 6 months. Chemoprevention (e.g., sulindac) can aid in management of the remaining rectum; however, there are no medications currently approved by the FDA for this indication.  There are data to suggest that sulindac showed the most significant polyp regression, but it is unclear if the decrease in polyp burden equates to reduction in colorectal cancer risk. Extracolonic: Upper endoscopy with side-viewing examination beginning at age 43-25 years. Consider upper endoscopy at an earlier age if colectomy is performed prior to age 66 years. Frequency of upper endoscopy surveillance is dependent on polyp burden. It is important to note that fundic gland polyps are common in individuals with  FAP and while focal low-grade dysplasia can be identified, it is typically non-progressive. Non-fundic gland polyps should be managed endoscopically if possible. Annual thyroid examination beginning in the late teenage years.  Annual thyroid ultrasound may be considered, but data are lacking to support this recommendation. Annual physical examination for CNS cancers. Annual abdominal palpation for desmoids. If family history of  symptomatic desmoids: consider abdominal MRI or CT  1-3 years post-colectomy, then every 5-10 years. For small bowel polyps and cancer, consider adding small bowel visualization to MRI or CT for desmoids especially if duodenal polyposis is advanced. Consider liver palpation, abdominal ultrasound, and measurement of AFP every 3-6 months during the first 5 years of life.  An individual's cancer risk and medical management are not determined by genetic test results alone. Overall cancer risk assessment incorporates additional factors, including personal medical history, family history, and any available genetic information that may result in a personalized plan for cancer prevention and surveillance.  It is advantageous to know if an individual has a pathogenic variant in APC as medical management recommendations can be implemented. At-risk relatives can be identified, allowing pursuit of a diagnostic evaluation. In addition, the available information regarding hereditary cancer susceptibility genes is constantly evolving and more clinically relevant APC data are likely to become available in the near future. Awareness of this cancer predisposition encourages patients and their providers to inform at-risk family members, to diligently follow standard screening protocols, and to be vigilant in maintaining close and regular contact with their local genetics clinic in anticipation of new information.   FAMILY MEMBERS: Since we now know the mutation in Mikayla Rios, we can test at-risk relatives to determine whether or not they have inherited the mutation and are at increased risk for cancer.  It is important that all of Mikayla Rios's relatives (both men and women) know of the presence of this gene mutation. Site-specific genetic testing can sort out who in the family is at risk and who is not. We will be happy to meet with any of the family members or refer them to a genetic counselor in their local area. To locate  genetic counselors in other cities, individuals can visit the website of the [employer] ([employer]) and Secretary/administrator for a Social worker by zip code.   Mikayla Rios's children and sister have a 50% chance to have inherited this mutation. She reports that she thinks Rios sister has had testing.  We recommend that Rios sister undergoes genetic testing if it has not happened.  Additionally, each of Rios children need to undergo genetic testing and start screening if they test positive. Rios oldest son is autistic and Rios other children are minors. Mikayla Rios's children can be seen in the [facility] by Artist Pais, MD.  Their PCP can make the referral to that clinic with the indication on why they are being referred.   PLAN: Once Ms. Bermea completes Rios treatment she will need to be followed as high risk based on Rios diagnosis of FAP.    Ms. Fogelman has identified a gastroenterologist that she plans to see for follow up for Rios diagnosis of FAP.  This individual is Dr. Lorenso Courier.    Ms. Chichester will be seen as part of the [facility] for continued follow up for Rios current cancer and future navigation for Rios FAP screening.  We strongly encouraged Ms. Justen to remain in contact with [place] in cancer genetics on  an annual basis so we can update Ms. Kleckner's personal and family histories, and inform Rios of advances in cancer genetics that may be of benefit for the entire family. Ms. Pickney knows she is also welcome to call with any questions or concerns, at any time.   Kyra Laffey P. Florene Glen, [place], [facility]  Licensed, Insurance risk surveyor [email or web address] phone: [phone]  The patient was seen for a total of 35 minutes in face-to-face genetic counseling.

## 2022-06-11 ENCOUNTER — Other Ambulatory Visit (HOSPITAL_COMMUNITY): Payer: Self-pay

## 2022-06-22 ENCOUNTER — Inpatient Hospital Stay: Payer: BC Managed Care – PPO | Admitting: Licensed Clinical Social Worker

## 2022-06-22 DIAGNOSIS — C187 Malignant neoplasm of sigmoid colon: Secondary | ICD-10-CM

## 2022-06-22 NOTE — Progress Notes (Signed)
[facility] CSW Progress Note  Holiday representative  received an inquiry from pt regarding programs that might assist w/ a down payment or deposit on a new home as pt is looking into moving this year into more affordable housing.  Pt states she has finally been approved for disability, but SSI is still pending so she is unsure when she will receive a payout.  CSW provided contact information for the [employer] to explore any programs which may be available to assist pt.  CSW to remain available as appropriate to provide support throughout duration of treatment.       Henriette Combs, LCSW

## 2022-06-24 DIAGNOSIS — Z933 Colostomy status: Secondary | ICD-10-CM | POA: Diagnosis not present

## 2022-06-24 DIAGNOSIS — Z85038 Personal history of other malignant neoplasm of large intestine: Secondary | ICD-10-CM | POA: Diagnosis not present

## 2022-06-24 DIAGNOSIS — Z4801 Encounter for change or removal of surgical wound dressing: Secondary | ICD-10-CM | POA: Diagnosis not present

## 2022-06-24 DIAGNOSIS — S31109A Unspecified open wound of abdominal wall, unspecified quadrant without penetration into peritoneal cavity, initial encounter: Secondary | ICD-10-CM | POA: Diagnosis not present

## 2022-06-28 NOTE — Progress Notes (Signed)
[facility]  Telephone:(336) [phone] Fax:(336) [phone]   Name: Mikayla Rios Date: 06/28/2022 MRN: [id number]  DOB: [birth year]  Patient Care Team: [name], MD as PCP - General (Internal Medicine) [name], MD as PCP - Cardiology (Cardiology) [name], [name] as Physician Assistant (Emergency Medicine) [name], MD as Consulting Physician (Oncology) [name], MD as Consulting Physician (General Surgery) [name], [name], MD as Consulting Physician (Neurology) [name], [name], MD as Consulting Physician (Obstetrics and Gynecology) [name], MD as Consulting Physician (Gastroenterology) [name], [name], NP as Nurse Practitioner (Nurse Practitioner)    INTERVAL HISTORY: Mikayla Rios is a 37 y.o. 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 Recent CT scan on 06/16/21 showed progressive metastatic disease. Palliative ask to see for symptom management.    SOCIAL HISTORY:     reports that she has never smoked. She has never used smokeless tobacco. She reports that she does not currently use alcohol. She reports that she does not use drugs.  ADVANCE DIRECTIVES:    CODE STATUS:   PAST MEDICAL HISTORY: Past Medical History:  Diagnosis Date   Allergy    seasonal   Anemia    Blood infection 1985   Blood transfusion without reported diagnosis    Diverticulitis    Family history of breast cancer    Family history of colon cancer    Family history of colon cancer    Family history of stomach cancer    Hypertension    Obesity    Sleep apnea     ALLERGIES:  is allergic to chlorhexidine gluconate, shellfish allergy, and penicillins.  MEDICATIONS:  Current Outpatient Medications  Medication Sig Dispense Refill   ALPRAZolam (XANAX) 0.25 MG tablet Take 1 tablet  (0.25 mg total) by mouth at bedtime as needed for anxiety. 30 tablet 0   amLODipine (NORVASC) 10 MG tablet TAKE 1 TABLET BY MOUTH EVERY DAY 90 tablet 3   DULoxetine (CYMBALTA) 60 MG capsule TAKE 1 CAPSULE BY MOUTH EVERY DAY 90 capsule 2   lidocaine-prilocaine (EMLA) cream Apply 1 Application topically as needed. 30 g 0   metoprolol tartrate 75 MG TABS Take 75 mg by mouth 2 (two) times daily. 90 tablet 3   oxyCODONE-acetaminophen (PERCOCET) 7.5-325 MG tablet Take 1 tablet by mouth every 8 (eight) hours as needed for severe pain. 30 tablet 0   POTASSIUM PO Take by mouth.     No current facility-administered medications for this visit.   Facility-Administered Medications Ordered in Other Visits  Medication Dose Route Frequency Provider Last Rate Last Admin   fentaNYL (SUBLIMAZE) injection   Intravenous PRN Mir, Paula Libra, MD   50 mcg at 10/02/21 1436   midazolam (VERSED) injection   Intravenous PRN Mir, Paula Libra, MD   1 mg at 10/02/21 1436    VITAL SIGNS: There were no vitals taken for this visit. There were no vitals filed for this visit.    Estimated body mass index is 40.55 kg/m as calculated from the following:   Height as of 06/10/22: '5\' 3"'$  (1.6 m).   Weight as of 06/10/22: 228 lb 14.4 oz (103.8 kg).   PERFORMANCE STATUS (ECOG) : 1 - Symptomatic but completely ambulatory   Physical Exam General: NAD Cardiovascular:RRR Respiratory: normal breathing pattern  Abdomen: soft, tender, + bowel sounds, colostomy in place Neurological: AAO x4, mood appropriate  IMPRESSION:  I saw Mikayla Rios during her infusion. No acute distress identified. She denies nausea, vomiting, diarrhea, or constipation. Continues to appreciate how well she is doing overall. Share family are all doing well and she speaks to her appreciate being more involved in her children's lives given improvement in her quality of life. Appetite is doing great. She has gained some weight.   Neoplams related pain Pain is much  improved. Does not require daily medications however does have pain on occasions Mikayla Rios on hand as needed for severe breakthrough pain.However does not have to take. Takes Tylenol as needed. No longer taking Cymbalta.   I am much appreciative of Colleen's improvement. She knows we are available if ever needed.   2. Anxiety Mikayla Rios shares some days are better than others. She manages her anxiety and emotional state by breathing techniques and meditation. Strong faith.   Also has support of Dr. Michail Sermon when needed.   PLAN: Tylenol as needed for mild aches and pains.  Discontinued use of Cymbalta Miralax daily for constipation Xanax 0.'25mg'$  as needed at bedtime for sleep/anxiety. I will plan to see her back in 6-8 weeks as needed, in collaboration with future appointments.   Patient expressed understanding and was in agreement with this plan. She also understands that She can call the clinic at any time with any questions, concerns, or complaints.    Time Total: 25 min.   Visit consisted of counseling and education dealing with the complex and emotionally intense issues of symptom management and palliative care in the setting of serious and potentially life-threatening illness.Greater than 50%  of this time was spent counseling and coordinating care related to the above assessment and plan.  Alda Lea, AGPCNP-BC  Palliative Medicine Team/[facility]

## 2022-06-28 NOTE — Progress Notes (Signed)
Patient Care Team: [name], MD as PCP - General (Internal Medicine) [name], MD as PCP - Cardiology (Cardiology) [name], [name] as Physician Assistant (Emergency Medicine) [name], MD as Consulting Physician (Oncology) [name], MD as Consulting Physician (General Surgery) [name], [name], MD as Consulting Physician (Neurology) [name], [name], MD as Consulting Physician (Obstetrics and Gynecology) [name], MD as Consulting Physician (Gastroenterology) [name], [name], NP as Nurse Practitioner (Nurse Practitioner)   CHIEF COMPLAINT: Follow-up colon cancer  Oncology History Overview Note   Cancer Staging  Malignant neoplasm of sigmoid colon [facility]) Staging form: Colon and Rectum, AJCC 8th Edition - Pathologic stage from 04/15/2021: Stage IIC (pT4b, pN0, cM0) - Signed by [name], MD on 06/12/2021    Malignant neoplasm of sigmoid colon [facility])   Initial Diagnosis   Malignant neoplasm of sigmoid colon ([place])   11/04/2020 Imaging   EXAM: CT ABDOMEN AND PELVIS WITHOUT CONTRAST  IMPRESSION: 1. Perforating descending colonic diverticulitis with multiple abdominopelvic gas and fluid collections, measuring up to 5.7 cm and further described above. 2. Cholelithiasis without findings of acute cholecystitis. 3. 3.6 cm benign left adrenal adenoma. 4. Leiomyomatous uterus. 5. 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   11/07/2020 Imaging   EXAM: CT ABDOMEN AND PELVIS WITHOUT CONTRAST  IMPRESSION: Continued wall thickening is seen involving descending colon suggesting infectious or inflammatory colitis or perforated diverticulitis. There is an adjacent fluid collection measuring 7.0 x 5.0 cm consistent with abscess which is significantly enlarged compared to prior exam. Adjacent to the abscess, there is a severely thickened  small bowel loop most consistent with secondary inflammation.   5.2 x 3.5 cm fluid collection is noted within the left psoas muscle consistent with abscess which is significantly enlarged compared to prior exam. 4.4 x 3.8 cm fluid collection consistent with abscess is noted in the left retroperitoneal region which is also enlarged compared to prior exam.   Fibroid uterus.   Cholelithiasis.   11/27/2020 Imaging   EXAM: CT ABDOMEN AND PELVIS WITHOUT CONTRAST  IMPRESSION: 1. Significantly interval decreased size of the previously visualized left retroperitoneal and left anterior mid abdominal fluid collections. There is persistent fat stranding and mural thickening of the mid descending colon at this level. 2. Unchanged leiomyomatous uterus. 3. Unchanged cholelithiasis.   12/11/2020 Imaging   EXAM: CT ABDOMEN AND PELVIS WITHOUT CONTRAST  IMPRESSION: 1. Stable inflammatory changes centered around the distal descending colon in the left lower abdomen. Pericolonic inflammatory changes have minimally changed since 11/27/2020. Small pocket of gas medial to the colon is probably associated with a fistula or small residual abscess collection. 2. Stable position of the two percutaneous drains. The more anterior drain may be extending through a portion of the small bowel. 3. Cholelithiasis. 4. Fibroid uterus. Cannot exclude an ovarian/adnexal cystic structure near the uterine fundus. 5. Left adrenal adenoma.   01/07/2021 Imaging   EXAM: CT ABDOMEN AND PELVIS WITH CONTRAST  IMPRESSION: 1. No new abscesses identified. Similar degree of soft tissue thickening seen in the left pericolonic region. The degree of persistent soft tissues thickening in the pericolonic space further raises suspicions for malignancy. Further evaluation with colonoscopy should be performed. 2. Left retroperitoneal abscess drain unchanged in position. Anterior left abdominal drain again seen terminating within  small bowel loop. 3. 2.6 cm mildly sclerotic lesion noted in the L2 vertebral body. Further evaluation with contrast enhanced lumbar spine  MRI should be performed.   02/04/2021 Imaging   EXAM: CT ABDOMEN AND PELVIS WITH CONTRAST  IMPRESSION: No new abdominopelvic collections or abscess development in the 1 month interval.   Left anterior drain remains within a loop of small bowel, unchanged.   Left lateral abscess drain remains in the retroperitoneal space adjacent to the iliopsoas muscle with a small amount of residual fluid and air but no measurable collection.   Stable soft tissue prominence and pericolonic strandy edema/inflammation about the left descending colon compatible with residual diverticulitis/colitis. Difficult to exclude underlying transmural lesion.   04/01/2021 Imaging   EXAM: CT ABDOMEN AND PELVIS WITH CONTRAST  IMPRESSION: There appears to be significant enhancement and wall thickening involving the descending colon with some degree of traction and involvement of adjacent small bowel loops. This is consistent with the history of diverticulitis and perforation. Stable position of percutaneous drainage catheter is seen adjacent to left psoas muscle with no significant residual fluid remaining. The other percutaneous drainage catheter that was previously noted to be within small bowel loop on prior exam in the left lower quadrant, has significantly retracted and appears to be outside of the peritoneal space at this time.   Since the prior exam, there does appear to be some degree of rotation involving mesenteric vessels and structures in the right lower quadrant, suggesting partial volvulus or malrotation. There is the interval development of several lymph nodes in this area, most likely inflammatory or reactive in etiology. Mild amount of free fluid is also noted in the pelvis. However, no significant bowel wall thickening or dilatation is seen in this area.  These results will be called to the ordering clinician or representative by the Radiologist Assistant, and communication documented in the PACS or zVision Dashboard.   Stable uterine fibroid.   Hepatic steatosis.   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   Cholelithiasis.   04/10/2021 Imaging   EXAM: CT ANGIOGRAPHY CHEST CT ABDOMEN AND PELVIS WITH CONTRAST  IMPRESSION: 1. Again seen are findings compatible with descending colon diverticulitis with perforation. Free air and inflammation have increased in the interval. 2. New lobulated enhancing fluid collection posterior to this segment of inflamed colon measuring 8.5 x 3.5 x 10.0 cm. This collection now invades the adjacent iliopsoas muscle as well as extends through the left lateral abdominal wall. The tip of the drainage catheter is in this collection. Findings are compatible with abscess. 3. Anterior left percutaneous drainage catheter tip has been pulled back and is now within the subcutaneous tissues. 4. Trace free fluid. 5. No pulmonary embolism.  No acute cardiopulmonary process. 6. Cholelithiasis. 7. Fatty infiltration of the liver.   04/15/2021 Definitive Surgery   FINAL MICROSCOPIC DIAGNOSIS:   A. SMALL BOWEL, RESECTION:  - Adenocarcinoma.  - No carcinoma identified in 1 lymph node.   B. PERFORATED LEFT COLON, RESECTION:  - Moderately differentiated colonic adenocarcinoma.  - Tumor extends into pericolonic adipose tissue, and is strongly  suggestive of invasion into small bowel.  See oncology table/comments.  - No carcinoma identified in 4 lymph nodes (0/4).  - Tubular adenoma with high-grade dysplasia, 1.  - Tubular adenomas with low grade dysplasia, 3.   Comments: The size of the tumor is difficult to estimate secondary to the disrupted nature of the specimen, as well as the infiltrative nature of the tumor.  Tumor can be identified as definitely invading into the pericolonic adipose tissue (block B4), but given  the extreme disruption of the tissue, and the involvement  of the small bowel by what appears to be a colonic adenocarcinoma, I favor perforation of the large bowel with direct invasion into the small bowel.  Accordingly, I believe this is best regarded as a pT4b lesion.   ADDENDUM:  Mismatch Repair Protein (IHC)  SUMMARY INTERPRETATION: ABNORMAL  There is loss of the major MMR protein [name]6: This indicates a high probability that a hereditary germline mutation is present and referral to genetic counseling is warranted. It is recommended that the loss of protein expression be correlated with molecular based microsatellite instability testing.   IHC EXPRESSION RESULTS  TEST           RESULT  MLH1:          Preserved nuclear expression  [name]2:          Preserved nuclear expression  [name]6:          LOSS OF NUCLEAR EXPRESSION  PMS2:          Preserved nuclear expression    04/15/2021 Cancer Staging   Staging form: Colon and Rectum, AJCC 8th Edition - Pathologic stage from 04/15/2021: Stage IIC (pT4b, pN0, cM0) - Signed by [name], MD on 06/12/2021 Stage prefix: Initial diagnosis Total positive nodes: 0 Histologic grading system: 4 grade system Histologic grade (G): G2 Residual tumor (R): R0 - None   04/29/2021 Imaging   EXAM: CT ABDOMEN AND PELVIS WITH CONTRAST  IMPRESSION: Continued left retroperitoneal abscesses inferior to the left kidney, involving the left psoas muscle and left abdominal wall musculature. Overall size is decreased since prior study. Interval removal of left lower quadrant abscess drainage catheter.   New fluid collection in the cul-de-sac and wrapping around the uterus concerning for abscess.   Cholelithiasis.   Small left pleural effusion.  Bibasilar atelectasis.   Stable left adrenal adenoma.  ADDENDUM: After discussing the case with Dr. Dwaine Gale in interventional radiology, it was noted that the fluid in the pelvis originally thought to be in the cul-de-sac is  likely within the vagina, best seen on sagittal imaging. Recommend speculum exam.   Also, in the left lateral wall in the area of prior abscess in the lateral wall abdominal musculature, some of the abdomen soft tissue appears to be enhancing. While this could be infectious, cannot exclude tumor seeding in the left lateral abdominal wall, measuring 6.9 x 3.8 cm on image 64 of series 2.   06/16/2021 Imaging   EXAM: CT ABDOMEN AND PELVIS WITH CONTRAST  IMPRESSION: Increased size of bulky soft tissue masses involving the left psoas muscle and left lateral abdominal wall soft tissues, consistent with progressive metastatic disease.   Significant progression of multiple peritoneal masses throughout the abdomen and pelvis, consistent with peritoneal carcinomatosis.   New mild retroperitoneal lymphadenopathy, consistent with metastatic disease.   Stable uterine fibroid and left adrenal adenoma.   Cholelithiasis, without evidence of cholecystitis.   06/22/2021 - 08/19/2021 Chemotherapy   Patient is on Treatment Plan : COLORECTAL FOLFOX q14d x 6 months     06/22/2021 Tumor Marker   Patient's tumor was tested for the following markers: CEA. Results of the tumor marker test revealed 87.41.   07/10/2021 Pathology Results   FINAL MICROSCOPIC DIAGNOSIS:   A. PERITONEAL MASS, LEFT UPPER ABDOMINAL QUADRANT, BIOPSY:  -  Metastatic adenocarcinoma with necrosis, histologically consistent with colon primary.     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 The report date is 07/16/2021. The remainder of testing  was negative/normal.  The CancerNext-Expanded + RNAinsight gene panel offered by [employer] and includes sequencing and rearrangement analysis for the following 77 genes: IP, ALK, APC*, ATM*, AXIN2, BAP1, BARD1, BLM, BMPR1A, BRCA1*, BRCA2*, BRIP1*, CDC73, CDH1*,CDK4, CDKN1B, CDKN2A, CHEK2*, CTNNA1, DICER1, FANCC, FH, FLCN,  GALNT12, KIF1B, LZTR1, MAX, MEN1, MET, MLH1*, [name]2*, [name]3, [name]6*, MUTYH*, NBN, NF1*, NF2, NTHL1, PALB2*, PHOX2B, PMS2*, POT1, PRKAR1A, PTCH1, PTEN*, RAD51C*, RAD51D*,RB1, RECQL, RET, SDHA, SDHAF2, SDHB, SDHC, SDHD, SMAD4, SMARCA4, SMARCB1, SMARCE1, STK11, SUFU, TMEM127, TP53*,TSC1, TSC2, VHL and XRCC2 (sequencing and deletion/duplication); EGFR, EGLN1, HOXB13, KIT, MITF, PDGFRA, POLD1 and POLE (sequencing only); EPCAM and GREM1 (deletion/duplication only).   08/28/2021 Imaging   EXAM: CT CHEST, ABDOMEN, AND PELVIS WITH CONTRAST  IMPRESSION: 1. Continued interval progression of multiple metastatic soft tissue nodules/masses in the abdomen and pelvis. 2. No evidence for metastatic disease in the chest. 3. Similar appearance of the subtle lesion in the L2 vertebral body, suspicious for metastatic involvement. 4. Cholelithiasis.   09/03/2021 -  Chemotherapy   Patient is on Treatment Plan : COLORECTAL Pembrolizumab (200) q21d     09/08/2021 - 02/01/2022 Chemotherapy   Patient is on Treatment Plan : COLORECTAL Pembrolizumab (200) q21d     01/08/2022 Imaging   CT CAP IMPRESSION: 1. Mixed response to therapy. Interval decrease in size of the conglomerate multilobulated mass extending from the left psoas into the left posterior pararenal space through the left lateral abdominal wall along the tract of the previously placed percutaneous drainage catheter. Interval decrease in size of right adnexal mass and omental masses within the right lower quadrant of the abdomen. Interval development of peritoneal carcinomatosis with omental caking within the left lower quadrant of the abdomen and left subdiaphragmatic region adjacent to the spleen and along the left pericolic gutter as well as development of ascites appearing loculated within the anterior peritoneum. 2. Interval development of mild left hydronephrosis secondary to stricturing of the mid left ureter. 3. Stable left adrenal metastasis. 4. Stable  sclerotic lesion within the L2 vertebral body. No new lytic or blastic bone lesions are seen. 5. Cholelithiasis. 6. No evidence of intrathoracic metastatic disease. 7. 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 Moderate colonic stool burden. No evidence of obstruction.   03/15/2022 Imaging   IMPRESSION: 1. Status post sigmoid colon resection with left lower quadrant end colostomy. 2. Multicystic bilateral ovarian lesions are slightly increased in size. 3. Diminished, small volume of loculated appearing ascites throughout the abdomen and pelvis, with similar appearance of peritoneal thickening and nodularity throughout. 4. Heterogeneously calcified, conglomerate masses in the left retroperitoneum and abdominal wall are slightly diminished in size. 5. Left adrenal lesion not significantly changed. 6. Unchanged faintly sclerotic metastatic lesion of L2. 7. Constellation of findings is consistent with mixed response to treatment however with overall little interval change. 8. Minimal residual left hydronephrosis and proximal hydroureter, improved compared to prior examination. 9. Cholelithiasis. 10. Coronary artery disease, significantly advanced for patient age.   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 Trieu returns for follow-up and treatment as scheduled, last seen by me 05/18/2022 and by my colleague Cassie on 06/10/2021; continues pembrolizumab q.  21 days. She has had more fatigue for past 3 cycles, wanting to rest more, but still recovers well after 2-3 days. Eating and drinking normally. She wants to loose some weight. Has  started walking more to strengthen her legs. Skin around ostomy is irritated, she would like to meet with ostomy nurse again. Fibro pain in fingers continues to flare, OK today; denies ABD/other pain. Having hot flashes at night.  Saw genetics, going to test all 3 of her children.   ROS  All other systems reviewed and negative  Past Medical History:  Diagnosis Date   Allergy    seasonal   Anemia    Blood infection 1985   Blood transfusion without reported diagnosis    Diverticulitis    Family history of breast cancer    Family history of colon cancer    Family history of colon cancer    Family history of stomach cancer    Hypertension    Obesity    Sleep apnea      Past Surgical History:  Procedure Laterality Date   COLECTOMY WITH COLOSTOMY CREATION/HARTMANN PROCEDURE N/A 04/15/2021   Procedure: COLOSTOMY CREATION/HARTMANN PROCEDURE;  Surgeon: [name], MD;  Location: [facility];  Service: General;  Laterality: N/A;   IR CATHETER TUBE CHANGE  12/12/2020   IR CATHETER TUBE CHANGE  01/27/2021   IR CATHETER TUBE CHANGE  02/19/2021   IR CATHETER TUBE CHANGE  04/13/2021   IR IMAGING GUIDED PORT INSERTION  10/02/2021   IR RADIOLOGIST EVAL & MGMT  11/27/2020   IR RADIOLOGIST EVAL & MGMT  12/11/2020   IR RADIOLOGIST EVAL & MGMT  01/07/2021   IR RADIOLOGIST EVAL & MGMT  02/04/2021   IR SINUS/FIST TUBE CHK-NON GI  12/12/2020   IR SINUS/FIST TUBE CHK-NON GI  02/19/2021   LAPAROSCOPIC PARTIAL COLECTOMY N/A 04/15/2021   Procedure: LAPAROSCOPIC ASSISTED HARTMANN RESECTION;  Surgeon: [name], MD;  Location: [facility];  Service: General;  Laterality: N/A;     Outpatient Encounter Medications as of 06/29/2022  Medication Sig   ALPRAZolam (XANAX) 0.25 MG tablet Take 1 tablet (0.25 mg total) by mouth at bedtime as needed for anxiety.   amLODipine (NORVASC) 10 MG tablet TAKE 1 TABLET BY MOUTH EVERY DAY   DULoxetine (CYMBALTA) 60 MG capsule TAKE 1 CAPSULE BY MOUTH EVERY DAY   metoprolol  tartrate 75 MG TABS Take 75 mg by mouth 2 (two) times daily.   oxyCODONE-acetaminophen (PERCOCET) 7.5-325 MG tablet Take 1 tablet by mouth every 8 (eight) hours as needed for severe pain.   POTASSIUM PO Take by mouth.   [DISCONTINUED] lidocaine-prilocaine (EMLA) cream Apply 1 Application topically as needed.   lidocaine-prilocaine (EMLA) cream Apply 1 Application topically as needed.   [DISCONTINUED] prochlorperazine (COMPAZINE) 10 MG tablet Take 1 tablet (10 mg total) by mouth every 6 (six) hours as needed (Nausea or vomiting).   Facility-Administered Encounter Medications as of 06/29/2022  Medication   fentaNYL (SUBLIMAZE) injection   midazolam (VERSED) injection     Today's Vitals   06/29/22 1153  BP: (!) 134/90  Pulse: 96  Resp: 16  Temp: 98.2 F (36.8 C)  TempSrc: Oral  SpO2: 100%  Weight: 234 lb 8 oz (106.4 kg)   Body mass index is 41.54 kg/m.   PHYSICAL EXAM GENERAL:alert, no distress and comfortable SKIN: no rash  EYES: sclera clear NECK: without mass LUNGS:  normal breathing effort HEART:  no lower extremity edema PAC without erythema    CBC  Component Value Date/Time   WBC 6.7 06/29/2022 1125   WBC 6.0 10/10/2021 0420   RBC 4.15 06/29/2022 1125   HGB 11.3 (L) 06/29/2022 1125   HGB 11.1 10/02/2019 1127   HCT 34.4 (L) 06/29/2022 1125   HCT 35.8 10/02/2019 1127   PLT 434 (H) 06/29/2022 1125   PLT 557 (H) 10/02/2019 1127   MCV 82.9 06/29/2022 1125   MCV 80 10/02/2019 1127   MCH 27.2 06/29/2022 1125   MCHC 32.8 06/29/2022 1125   RDW 15.3 06/29/2022 1125   RDW 14.7 10/02/2019 1127   LYMPHSABS 1.8 06/29/2022 1125   LYMPHSABS 2.5 08/16/2018 1126   MONOABS 0.4 06/29/2022 1125   EOSABS 0.1 06/29/2022 1125   EOSABS 0.0 08/16/2018 1126   BASOSABS 0.0 06/29/2022 1125   BASOSABS 0.1 08/16/2018 1126     CMP     Component Value Date/Time   NA 140 06/29/2022 1125   NA 139 12/21/2019 1024   K 3.9 06/29/2022 1125   CL 110 06/29/2022 1125   CO2 24  06/29/2022 1125   GLUCOSE 92 06/29/2022 1125   BUN 21 (H) 06/29/2022 1125   BUN 13 12/21/2019 1024   CREATININE 0.84 06/29/2022 1125   CALCIUM 8.9 06/29/2022 1125   PROT 7.0 06/29/2022 1125   PROT 7.4 07/11/2019 1638   ALBUMIN 3.8 06/29/2022 1125   ALBUMIN 4.6 07/11/2019 1638   AST 13 (L) 06/29/2022 1125   ALT 11 06/29/2022 1125   ALKPHOS 77 06/29/2022 1125   BILITOT 0.2 (L) 06/29/2022 1125   GFRNONAA >60 06/29/2022 1125   GFRAA 96 12/21/2019 1024     ASSESSMENT & PLAN: ARMETTA HENRI is a 37 y.o. female with    1. 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 She was found to have diverticulitis with perforation. She also developed an abscess requiring 2 drains; multiple hospital stay.  -S/p emergent small bowel and left colon resection on 04/15/21 under Dr. Johney Maine. 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 Margins negative for invasive carcinoma, but low-grade dysplasia involves proximal margin. All 5 lymph nodes negative (0/5). MSI low. - CT AP on 06/16/21 showed progressive metastatic disease involving soft tissue masses in left psoas muscle and left lateral abdominal wall and peritoneal masses, and new mild retroperitoneal lymphadenopathy. peritoneal mass bx 07/10/21 confirmed metastatic adeno, c/w colon primary  -baseline CEA on 06/22/21 was elevated at 87.41. -FO showed MSI-H disease, BRCA, [name]6, and APC mutations among others which are not targetable. 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  Due to BRCA mutation on FO, she may be a candidate for PARP inhibitor down the line -she progressed on first line FOLFOX (06/22/21 - 08/17/21) and switch to second line pembrolizumab q. 21 days starting 09/08/2021 -CEA normalized on  immunotherapy -CT 03/15/28 again showed mixed response, and PET with multiple findings overall indeterminate for mixed response to [place] vs secondary malignancy but gyn/onc felt less likely. No further gyn biopsies are recommended at this time (I reviewed this with her today 05/18/22) -Ms. Hearne appears stable. She continues pembrolizumab q21 days, tolerating well with fibromyalgia flares and fatigue, slightly increased in past 3 cycles. She still recovers well and feels good overall. No clinical evidence of progression. -Labs reviewed, adequate for treatment today. CEA is pending but has been normal  since 11/2021.  -She will proceed with pembro today as planned, and IV iron for recent Ferritin 26.  -F/up in 3 weeks with next cycle  -Plan to restage in Feb/March, or sooner if clinically indicated   2. Symptom Management: Abdominal pain, Weight loss, rash -Pain management per palliative care.  has percocet but not neeeding -Denies pain currently  -She would like to loose weight intentionally, we discussed healthy practices. I recommend healthy diet, water, and advance activity as tolerated, but not vigorous. Avoid skipping meals/fasting.  -Will monitor closely.    3. Anxiety and depression -Due to the prolonged illness and multiple hospital stay since May 2022, and cancer diagnosis, she has been feeling depressed lately. -cymbalta prescribed 05/25/21 -She had panic attack 2/13 after chemo, when she was reacting to clorhexidine.  -F/up Dr. Michail Sermon -Stable lately   4. Genetics, APC+ (FAP) -Mismatch repair protein testing performed on her surgical sample showed MSI low, with loss of [name]6 protein. 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 However her MSI was low, instead of high, which is unusual for Lynch syndrome. -FO showed MSI-High disease, and showed multiple other mutations including APC, [name]6, and BRCA mutations.  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  -We previously discussed genetic testing for her family. Half sister has been tested, is under Dr's care and is s/p total colectomy.  -Her children are 75, 95, and 25, oldest is autistic. They met Roma Kayser and all plan to get testing   5. Social Support -she is connected with Education officer, [employer] -she is working to apply for disability. Her job ended just prior to the start of her symptoms. -she has three children-- ages 58, 32, and 52.    PLAN: -Labs reviewed, proceed with pembro and IV iron today -F/up and next cycle in 3 weeks -Referral to ostomy clinic for skin breakdown -Restage in 1-2 months      All questions were answered. The patient knows to call the clinic with any problems, questions or concerns. No barriers to learning were detected.   Cira Rue, NP-C 06/29/2022

## 2022-06-29 ENCOUNTER — Inpatient Hospital Stay: Payer: BC Managed Care – PPO

## 2022-06-29 ENCOUNTER — Other Ambulatory Visit: Payer: Self-pay | Admitting: Hematology

## 2022-06-29 ENCOUNTER — Inpatient Hospital Stay (HOSPITAL_BASED_OUTPATIENT_CLINIC_OR_DEPARTMENT_OTHER): Payer: BC Managed Care – PPO | Admitting: Nurse Practitioner

## 2022-06-29 ENCOUNTER — Encounter: Payer: Self-pay | Admitting: Nurse Practitioner

## 2022-06-29 ENCOUNTER — Other Ambulatory Visit: Payer: Self-pay

## 2022-06-29 VITALS — BP 134/90 | HR 96 | Temp 98.2°F | Resp 16 | Wt 234.5 lb

## 2022-06-29 VITALS — BP 137/87 | HR 87 | Temp 99.3°F | Resp 16

## 2022-06-29 DIAGNOSIS — D509 Iron deficiency anemia, unspecified: Secondary | ICD-10-CM

## 2022-06-29 DIAGNOSIS — F419 Anxiety disorder, unspecified: Secondary | ICD-10-CM | POA: Diagnosis not present

## 2022-06-29 DIAGNOSIS — K5720 Diverticulitis of large intestine with perforation and abscess without bleeding: Secondary | ICD-10-CM | POA: Diagnosis not present

## 2022-06-29 DIAGNOSIS — C7972 Secondary malignant neoplasm of left adrenal gland: Secondary | ICD-10-CM | POA: Diagnosis not present

## 2022-06-29 DIAGNOSIS — Z9049 Acquired absence of other specified parts of digestive tract: Secondary | ICD-10-CM | POA: Diagnosis not present

## 2022-06-29 DIAGNOSIS — Z1509 Genetic susceptibility to other malignant neoplasm: Secondary | ICD-10-CM | POA: Diagnosis not present

## 2022-06-29 DIAGNOSIS — C7951 Secondary malignant neoplasm of bone: Secondary | ICD-10-CM | POA: Diagnosis not present

## 2022-06-29 DIAGNOSIS — K8020 Calculus of gallbladder without cholecystitis without obstruction: Secondary | ICD-10-CM | POA: Diagnosis not present

## 2022-06-29 DIAGNOSIS — I2510 Atherosclerotic heart disease of native coronary artery without angina pectoris: Secondary | ICD-10-CM | POA: Diagnosis not present

## 2022-06-29 DIAGNOSIS — Z515 Encounter for palliative care: Secondary | ICD-10-CM

## 2022-06-29 DIAGNOSIS — Z933 Colostomy status: Secondary | ICD-10-CM | POA: Diagnosis not present

## 2022-06-29 DIAGNOSIS — Z452 Encounter for adjustment and management of vascular access device: Secondary | ICD-10-CM

## 2022-06-29 DIAGNOSIS — C786 Secondary malignant neoplasm of retroperitoneum and peritoneum: Secondary | ICD-10-CM

## 2022-06-29 DIAGNOSIS — C187 Malignant neoplasm of sigmoid colon: Secondary | ICD-10-CM

## 2022-06-29 DIAGNOSIS — R222 Localized swelling, mass and lump, trunk: Secondary | ICD-10-CM | POA: Diagnosis not present

## 2022-06-29 DIAGNOSIS — J90 Pleural effusion, not elsewhere classified: Secondary | ICD-10-CM | POA: Diagnosis not present

## 2022-06-29 DIAGNOSIS — K760 Fatty (change of) liver, not elsewhere classified: Secondary | ICD-10-CM | POA: Diagnosis not present

## 2022-06-29 DIAGNOSIS — D259 Leiomyoma of uterus, unspecified: Secondary | ICD-10-CM | POA: Diagnosis not present

## 2022-06-29 DIAGNOSIS — Z7189 Other specified counseling: Secondary | ICD-10-CM

## 2022-06-29 DIAGNOSIS — Z5112 Encounter for antineoplastic immunotherapy: Secondary | ICD-10-CM | POA: Diagnosis not present

## 2022-06-29 LAB — CMP (CANCER CENTER ONLY)
ALT: 11 U/L (ref 0–44)
AST: 13 U/L — ABNORMAL LOW (ref 15–41)
Albumin: 3.8 g/dL (ref 3.5–5.0)
Alkaline Phosphatase: 77 U/L (ref 38–126)
Anion gap: 6 (ref 5–15)
BUN: 21 mg/dL — ABNORMAL HIGH (ref 6–20)
CO2: 24 mmol/L (ref 22–32)
Calcium: 8.9 mg/dL (ref 8.9–10.3)
Chloride: 110 mmol/L (ref 98–111)
Creatinine: 0.84 mg/dL (ref 0.44–1.00)
GFR, Estimated: >60 mL/min (ref >60)
Glucose, Bld: 92 mg/dL (ref 70–99)
Potassium: 3.9 mmol/L (ref 3.5–5.1)
Sodium: 140 mmol/L (ref 135–145)
Total Bilirubin: 0.2 mg/dL — ABNORMAL LOW (ref 0.3–1.2)
Total Protein: 7 g/dL (ref 6.5–8.1)

## 2022-06-29 LAB — CBC WITH DIFFERENTIAL (CANCER CENTER ONLY)
Abs Immature Granulocytes: 0.02 10*3/uL (ref 0.00–0.07)
Basophils Absolute: 0 10*3/uL (ref 0.0–0.1)
Basophils Relative: 1 %
Eosinophils Absolute: 0.1 10*3/uL (ref 0.0–0.5)
Eosinophils Relative: 2 %
HCT: 34.4 % — ABNORMAL LOW (ref 36.0–46.0)
Hemoglobin: 11.3 g/dL — ABNORMAL LOW (ref 12.0–15.0)
Immature Granulocytes: 0 %
Lymphocytes Relative: 27 %
Lymphs Abs: 1.8 10*3/uL (ref 0.7–4.0)
MCH: 27.2 pg (ref 26.0–34.0)
MCHC: 32.8 g/dL (ref 30.0–36.0)
MCV: 82.9 fL (ref 80.0–100.0)
Monocytes Absolute: 0.4 10*3/uL (ref 0.1–1.0)
Monocytes Relative: 5 %
Neutro Abs: 4.3 10*3/uL (ref 1.7–7.7)
Neutrophils Relative %: 65 %
Platelet Count: 434 10*3/uL — ABNORMAL HIGH (ref 150–400)
RBC: 4.15 MIL/uL (ref 3.87–5.11)
RDW: 15.3 % (ref 11.5–15.5)
WBC Count: 6.7 10*3/uL (ref 4.0–10.5)
nRBC: 0 % (ref 0.0–0.2)

## 2022-06-29 MED ORDER — SODIUM CHLORIDE 0.9 % IV SOLN
200.0000 mg | Freq: Once | INTRAVENOUS | Status: AC
  Administered 2022-06-29: 200 mg via INTRAVENOUS
  Filled 2022-06-29: qty 200

## 2022-06-29 MED ORDER — SODIUM CHLORIDE 0.9% FLUSH
10.0000 mL | INTRAVENOUS | Status: DC | PRN
  Administered 2022-06-29: 10 mL

## 2022-06-29 MED ORDER — HEPARIN SOD (PORK) LOCK FLUSH 100 UNIT/ML IV SOLN
500.0000 [IU] | Freq: Once | INTRAVENOUS | Status: AC | PRN
  Administered 2022-06-29: 500 [IU]

## 2022-06-29 MED ORDER — SODIUM CHLORIDE 0.9 % IV SOLN: Freq: Once | INTRAVENOUS | Status: AC

## 2022-06-29 MED ORDER — SODIUM CHLORIDE 0.9% FLUSH
10.0000 mL | Freq: Once | INTRAVENOUS | Status: AC
  Administered 2022-06-29: 10 mL

## 2022-06-29 MED ORDER — LIDOCAINE-PRILOCAINE 2.5-2.5 % EX CREA: 1.0000 | TOPICAL_CREAM | CUTANEOUS | 3 refills | Status: DC | PRN

## 2022-06-29 NOTE — Patient Instructions (Signed)
Iron Sucrose Injection What is this medication? IRON SUCROSE (EYE ern SOO krose) treats low levels of iron (iron deficiency anemia) in people with kidney disease. Iron is a mineral that plays an important role in making red blood cells, which carry oxygen from your lungs to the rest of your body. This medicine may be used for other purposes; ask your health care provider or pharmacist if you have questions. COMMON BRAND NAME(S): Venofer What should I tell my care team before I take this medication? They need to know if you have any of these conditions: Anemia not caused by low iron levels Heart disease High levels of iron in the blood Kidney disease Liver disease An unusual or allergic reaction to iron, other medications, foods, dyes, or preservatives Pregnant or trying to get pregnant Breastfeeding How should I use this medication? This medication is for infusion into a vein. It is given in a hospital or clinic setting. Talk to your care team about the use of this medication in children. While this medication may be prescribed for children as young as 2 years for selected conditions, precautions do apply. Overdosage: If you think you have taken too much of this medicine contact a poison control center or emergency room at once. NOTE: This medicine is only for you. Do not share this medicine with others. What if I miss a dose? Keep appointments for follow-up doses. It is important not to miss your dose. Call your care team if you are unable to keep an appointment. What may interact with this medication? Do not take this medication with any of the following: Deferoxamine Dimercaprol Other iron products This medication may also interact with the following: Chloramphenicol Deferasirox This list may not describe all possible interactions. Give your health care provider a list of all the medicines, herbs, non-prescription drugs, or dietary supplements you use. Also tell them if you smoke,  drink alcohol, or use illegal drugs. Some items may interact with your medicine. What should I watch for while using this medication? Visit your care team regularly. Tell your care team if your symptoms do not start to get better or if they get worse. You may need blood work done while you are taking this medication. You may need to follow a special diet. Talk to your care team. Foods that contain iron include: whole grains/cereals, dried fruits, beans, or peas, leafy green vegetables, and organ meats (liver, kidney). What side effects may I notice from receiving this medication? Side effects that you should report to your care team as soon as possible: Allergic reactions--skin rash, itching, hives, swelling of the face, lips, tongue, or throat Low blood pressure--dizziness, feeling faint or lightheaded, blurry vision Shortness of breath Side effects that usually do not require medical attention (report to your care team if they continue or are bothersome): Flushing Headache Joint pain Muscle pain Nausea Pain, redness, or irritation at injection site This list may not describe all possible side effects. Call your doctor for medical advice about side effects. You may report side effects to FDA at 1-800-FDA-1088. Where should I keep my medication? This medication is given in a hospital or clinic and will not be stored at home. NOTE: This sheet is a summary. It may not cover all possible information. If you have questions about this medicine, talk to your doctor, pharmacist, or health care provider.  [facility]  Discharge Instructions: Thank you for choosing [facility] to provide your oncology  and hematology care.   If you have a lab appointment with the [facility], please go directly to the [facility] and check in at the registration area.   Wear comfortable clothing and clothing appropriate for easy access to any Portacath or PICC  line.   We strive to give you quality time with your provider. You may need to reschedule your appointment if you arrive late (15 or more minutes).  Arriving late affects you and other patients whose appointments are after yours.  Also, if you miss three or more appointments without notifying the office, you may be dismissed from the clinic at the provider's discretion.      For prescription refill requests, have your pharmacy contact our office and allow 72 hours for refills to be completed.    Today you received the following chemotherapy and/or immunotherapy agents: Keytruda     To help prevent nausea and vomiting after your treatment, we encourage you to take your nausea medication as directed.  BELOW ARE SYMPTOMS THAT SHOULD BE REPORTED IMMEDIATELY: *FEVER GREATER THAN 100.4 F (38 C) OR HIGHER *CHILLS OR SWEATING *NAUSEA AND VOMITING THAT IS NOT CONTROLLED WITH YOUR NAUSEA MEDICATION *UNUSUAL SHORTNESS OF BREATH *UNUSUAL BRUISING OR BLEEDING *URINARY PROBLEMS (pain or burning when urinating, or frequent urination) *BOWEL PROBLEMS (unusual diarrhea, constipation, pain near the anus) TENDERNESS IN MOUTH AND THROAT WITH OR WITHOUT PRESENCE OF ULCERS (sore throat, sores in mouth, or a toothache) UNUSUAL RASH, SWELLING OR PAIN  UNUSUAL VAGINAL DISCHARGE OR ITCHING   Items with * indicate a potential emergency and should be followed up as soon as possible or go to the Emergency Department if any problems should occur.  Please show the CHEMOTHERAPY ALERT CARD or IMMUNOTHERAPY ALERT CARD at check-in to the Emergency Department and triage nurse.  Should you have questions after your visit or need to cancel or reschedule your appointment, please contact [facility]  Dept: [phone]  and follow the prompts.  Office hours are 8:00 a.m. to 4:30 p.m. Monday - Friday. Please note that voicemails left after 4:00 p.m. may not be returned until the following  business day.  We are closed weekends and major holidays. You have access to a nurse at all times for urgent questions. Please call the main number to the clinic Dept: [phone] and follow the prompts.   For any non-urgent questions, you may also contact your provider using MyChart. We now offer e-Visits for anyone 66 and older to request care online for non-urgent symptoms. For details visit [email or web address].   Also download the MyChart app! Go to the app store, search "MyChart", open the app, select [facility], and log in with your MyChart username and password.    2023 Elsevier/Gold Standard (2020-09-04 00:00:00)

## 2022-06-29 NOTE — Patient Instructions (Signed)

## 2022-06-30 ENCOUNTER — Telehealth: Payer: Self-pay | Admitting: Nurse Practitioner

## 2022-06-30 NOTE — Telephone Encounter (Signed)
Left patient a vm regarding upcoming appointments

## 2022-07-09 ENCOUNTER — Ambulatory Visit (HOSPITAL_COMMUNITY)
Admission: RE | Admit: 2022-07-09 | Discharge: 2022-07-09 | Disposition: A | Discharge status: Home / Self Care | Payer: BC Managed Care – PPO | Source: Ambulatory Visit | Attending: Nurse Practitioner | Admitting: Nurse Practitioner

## 2022-07-09 DIAGNOSIS — L24B3 Irritant contact dermatitis related to fecal or urinary stoma or fistula: Secondary | ICD-10-CM | POA: Diagnosis not present

## 2022-07-09 DIAGNOSIS — C189 Malignant neoplasm of colon, unspecified: Secondary | ICD-10-CM | POA: Diagnosis not present

## 2022-07-09 DIAGNOSIS — K9400 Colostomy complication, unspecified: Secondary | ICD-10-CM | POA: Diagnosis not present

## 2022-07-09 DIAGNOSIS — Z933 Colostomy status: Secondary | ICD-10-CM | POA: Insufficient documentation

## 2022-07-09 NOTE — Progress Notes (Signed)
[facility]   Reason for visit:  LUQ colostomy  recently started experiencing leaks.  Has started to regain some weight she had lost while sick.  HPI:  Colon cancer Past Medical History:  Diagnosis Date   Allergy    seasonal   Anemia    Blood infection 1985   Blood transfusion without reported diagnosis    Diverticulitis    Family history of breast cancer    Family history of colon cancer    Family history of colon cancer    Family history of stomach cancer    Hypertension    Obesity    Sleep apnea    Family History  Problem Relation Age of Onset   Diabetes Mother    Hypertension Mother    Colon cancer Mother        dx at age 15   Breast cancer Mother    Congestive Heart Failure Father    Hypertension Father    Diabetes Maternal Grandmother    Hypertension Maternal Grandmother    Diabetes Maternal Grandfather    Kidney disease Maternal Grandfather    Diabetes Paternal Grandmother    Autism Son    Stomach cancer Maternal Uncle    Heart disease Paternal Aunt    Colon polyps Half-Sister        colectomy   Ovarian cancer Neg Hx    Endometrial cancer Neg Hx    Pancreatic cancer Neg Hx    Prostate cancer Neg Hx    Allergies  Allergen Reactions   Chlorhexidine Gluconate Hives and Itching    CHG wipes cause itching and hives requiring benadryl   Shellfish Allergy Hives   Penicillins Hives and Rash    Has patient had a PCN reaction causing immediate rash, facial/tongue/throat swelling, SOB or lightheadedness with hypotension: No Has patient had a PCN reaction causing severe rash involving mucus membranes or skin necrosis: hives Has patient had a PCN reaction that required hospitalization No Has patient had a PCN reaction occurring within the last 10 years: yes If all of the above answers are "NO", then may proceed with Cephalosporin use.    Current Outpatient Medications  Medication Sig Dispense Refill Last Dose   ALPRAZolam (XANAX) 0.25 MG tablet  Take 1 tablet (0.25 mg total) by mouth at bedtime as needed for anxiety. 30 tablet 0    amLODipine (NORVASC) 10 MG tablet TAKE 1 TABLET BY MOUTH EVERY DAY 90 tablet 3    DULoxetine (CYMBALTA) 60 MG capsule TAKE 1 CAPSULE BY MOUTH EVERY DAY 90 capsule 2    lidocaine-prilocaine (EMLA) cream Apply 1 Application topically as needed. 30 g 3    metoprolol tartrate 75 MG TABS Take 75 mg by mouth 2 (two) times daily. 90 tablet 3    oxyCODONE-acetaminophen (PERCOCET) 7.5-325 MG tablet Take 1 tablet by mouth every 8 (eight) hours as needed for severe pain. 30 tablet 0    POTASSIUM PO Take by mouth.      No current facility-administered medications for this encounter.   Facility-Administered Medications Ordered in Other Encounters  Medication Dose Route Frequency Provider Last Rate Last Admin   fentaNYL (SUBLIMAZE) injection   Intravenous PRN Mir, Paula Libra, MD   50 mcg at 10/02/21 1436   midazolam (VERSED) injection   Intravenous PRN Mir, Paula Libra, MD   1 mg at 10/02/21 1436   ROS  Review of Systems  Constitutional:        Gaining weight after serious illness  Gastrointestinal:        LUQ colostomy  Skin:  Positive for rash.       Peristomal breakdown  Psychiatric/Behavioral: Negative.    All other systems reviewed and are negative.  Vital signs:  BP (!) 138/92   Pulse 91   Temp 98.5 F (36.9 C) (Oral)   Resp 18   SpO2 100%  Exam:  Physical Exam Vitals reviewed.  Constitutional:      Appearance: Normal appearance.  Abdominal:     Palpations: Abdomen is soft.     Comments: colostomy  Skin:    General: Skin is warm and dry.     Findings: Rash present.     Comments: Peristomal irritation  Neurological:     Mental Status: She is alert and oriented to person, place, and time.  Psychiatric:        Mood and Affect: Mood normal.        Behavior: Behavior normal.     Stoma type/location:  LUQ colostomy Stomal assessment/size:  pink and moist 1 3/8" budded  creasing at 3 and 9  o'clock Peristomal assessment:  Abdominal contour has changed with change in patient weight.  Patient already wears ostomy cover over pouch. Recommend ostomy belt and switching to 1 piece convex pouch Treatment options for stomal/peristomal skin: stoma powder and skin prep with barrier ring and 1 piece convex  Output: soft brown stool Ostomy pouching: 1pc. Convex  [phone] Barrier ring Stoma powder Skin prep Education provided:  new pouching system    Impression/dx  Colostomy complication Contact dermatitis Discussion  New pouching due to weight change and leaks Plan  See back in 2 weeks or as needed    Visit time: 45 minutes.   Domenic Moras FNP-BC

## 2022-07-13 ENCOUNTER — Encounter: Payer: Self-pay | Admitting: Pharmacist

## 2022-07-14 DIAGNOSIS — K9400 Colostomy complication, unspecified: Secondary | ICD-10-CM | POA: Insufficient documentation

## 2022-07-14 DIAGNOSIS — L24B3 Irritant contact dermatitis related to fecal or urinary stoma or fistula: Secondary | ICD-10-CM | POA: Insufficient documentation

## 2022-07-15 ENCOUNTER — Encounter: Payer: Self-pay | Admitting: Hematology

## 2022-07-18 NOTE — Assessment & Plan Note (Signed)
-  FO showed MSI-High disease, and multiple mutations including APC, [name]6, and BRCA2 mutations.  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  -we previously discussed testing for her children when they are of age. 

## 2022-07-18 NOTE — Assessment & Plan Note (Signed)
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 She has been tolerating well overall but has developed joint pain which could be related, overall manageable. She is clinically doing better also with more energy and less abdominal pain   -her CEA has normalized on immunotherapy. -CT CAP on 03/15/22 again showed mixed response -she had PET scan on 04/26/2022 for evaluation of her ovarian cysts, and saw GYN Dr. Ernestina Patches, we decided to monitor it. -she is clinically doing well, will repeat scan in 3-6 weeks

## 2022-07-19 ENCOUNTER — Encounter: Payer: Self-pay | Admitting: Hematology

## 2022-07-19 ENCOUNTER — Other Ambulatory Visit: Payer: Self-pay

## 2022-07-19 ENCOUNTER — Inpatient Hospital Stay: Payer: BC Managed Care – PPO

## 2022-07-19 ENCOUNTER — Inpatient Hospital Stay: Payer: BC Managed Care – PPO | Attending: Hematology

## 2022-07-19 ENCOUNTER — Inpatient Hospital Stay (HOSPITAL_BASED_OUTPATIENT_CLINIC_OR_DEPARTMENT_OTHER): Payer: BC Managed Care – PPO | Admitting: Hematology

## 2022-07-19 VITALS — BP 128/81 | HR 85 | Temp 98.6°F | Resp 18 | Ht 63.0 in | Wt 243.9 lb

## 2022-07-19 VITALS — BP 138/86 | HR 78 | Resp 18

## 2022-07-19 DIAGNOSIS — K6819 Other retroperitoneal abscess: Secondary | ICD-10-CM | POA: Insufficient documentation

## 2022-07-19 DIAGNOSIS — D649 Anemia, unspecified: Secondary | ICD-10-CM

## 2022-07-19 DIAGNOSIS — C786 Secondary malignant neoplasm of retroperitoneum and peritoneum: Secondary | ICD-10-CM

## 2022-07-19 DIAGNOSIS — D3502 Benign neoplasm of left adrenal gland: Secondary | ICD-10-CM | POA: Insufficient documentation

## 2022-07-19 DIAGNOSIS — D1391 Familial adenomatous polyposis: Secondary | ICD-10-CM | POA: Insufficient documentation

## 2022-07-19 DIAGNOSIS — Z79899 Other long term (current) drug therapy: Secondary | ICD-10-CM | POA: Insufficient documentation

## 2022-07-19 DIAGNOSIS — Z452 Encounter for adjustment and management of vascular access device: Secondary | ICD-10-CM

## 2022-07-19 DIAGNOSIS — C7951 Secondary malignant neoplasm of bone: Secondary | ICD-10-CM | POA: Diagnosis not present

## 2022-07-19 DIAGNOSIS — Z5112 Encounter for antineoplastic immunotherapy: Secondary | ICD-10-CM | POA: Insufficient documentation

## 2022-07-19 DIAGNOSIS — Z1501 Genetic susceptibility to malignant neoplasm of breast: Secondary | ICD-10-CM | POA: Diagnosis not present

## 2022-07-19 DIAGNOSIS — K8020 Calculus of gallbladder without cholecystitis without obstruction: Secondary | ICD-10-CM | POA: Diagnosis not present

## 2022-07-19 DIAGNOSIS — Z1509 Genetic susceptibility to other malignant neoplasm: Secondary | ICD-10-CM | POA: Diagnosis not present

## 2022-07-19 DIAGNOSIS — I2510 Atherosclerotic heart disease of native coronary artery without angina pectoris: Secondary | ICD-10-CM | POA: Insufficient documentation

## 2022-07-19 DIAGNOSIS — C187 Malignant neoplasm of sigmoid colon: Secondary | ICD-10-CM

## 2022-07-19 DIAGNOSIS — Z515 Encounter for palliative care: Secondary | ICD-10-CM

## 2022-07-19 DIAGNOSIS — D509 Iron deficiency anemia, unspecified: Secondary | ICD-10-CM

## 2022-07-19 LAB — CEA (IN HOUSE-CHCC): CEA (CHCC-In House): <1 ng/mL (ref 0.00–5.00)

## 2022-07-19 LAB — CBC WITH DIFFERENTIAL (CANCER CENTER ONLY)
Abs Immature Granulocytes: 0.02 10*3/uL (ref 0.00–0.07)
Basophils Absolute: 0 10*3/uL (ref 0.0–0.1)
Basophils Relative: 0 %
Eosinophils Absolute: 0.1 10*3/uL (ref 0.0–0.5)
Eosinophils Relative: 2 %
HCT: 33.2 % — ABNORMAL LOW (ref 36.0–46.0)
Hemoglobin: 10.7 g/dL — ABNORMAL LOW (ref 12.0–15.0)
Immature Granulocytes: 0 %
Lymphocytes Relative: 29 %
Lymphs Abs: 1.8 10*3/uL (ref 0.7–4.0)
MCH: 27.4 pg (ref 26.0–34.0)
MCHC: 32.2 g/dL (ref 30.0–36.0)
MCV: 84.9 fL (ref 80.0–100.0)
Monocytes Absolute: 0.4 10*3/uL (ref 0.1–1.0)
Monocytes Relative: 6 %
Neutro Abs: 3.9 10*3/uL (ref 1.7–7.7)
Neutrophils Relative %: 63 %
Platelet Count: 340 10*3/uL (ref 150–400)
RBC: 3.91 MIL/uL (ref 3.87–5.11)
RDW: 15.3 % (ref 11.5–15.5)
WBC Count: 6.2 10*3/uL (ref 4.0–10.5)
nRBC: 0 % (ref 0.0–0.2)

## 2022-07-19 LAB — CMP (CANCER CENTER ONLY)
ALT: 12 U/L (ref 0–44)
AST: 14 U/L — ABNORMAL LOW (ref 15–41)
Albumin: 3.8 g/dL (ref 3.5–5.0)
Alkaline Phosphatase: 64 U/L (ref 38–126)
Anion gap: 6 (ref 5–15)
BUN: 18 mg/dL (ref 6–20)
CO2: 22 mmol/L (ref 22–32)
Calcium: 8.7 mg/dL — ABNORMAL LOW (ref 8.9–10.3)
Chloride: 109 mmol/L (ref 98–111)
Creatinine: 0.67 mg/dL (ref 0.44–1.00)
GFR, Estimated: >60 mL/min (ref >60)
Glucose, Bld: 95 mg/dL (ref 70–99)
Potassium: 4 mmol/L (ref 3.5–5.1)
Sodium: 137 mmol/L (ref 135–145)
Total Bilirubin: 0.3 mg/dL (ref 0.3–1.2)
Total Protein: 6.9 g/dL (ref 6.5–8.1)

## 2022-07-19 LAB — IRON AND IRON BINDING CAPACITY (CC-WL,HP ONLY)
Iron: 61 ug/dL (ref 28–170)
Saturation Ratios: 20 % (ref 10.4–31.8)
TIBC: 300 ug/dL (ref 250–450)
UIBC: 239 ug/dL (ref 148–442)

## 2022-07-19 LAB — FERRITIN: Ferritin: 37 ng/mL (ref 11–307)

## 2022-07-19 LAB — TSH: TSH: 1.893 u[IU]/mL (ref 0.350–4.500)

## 2022-07-19 LAB — PREGNANCY, URINE: Preg Test, Ur: NEGATIVE

## 2022-07-19 MED ORDER — SODIUM CHLORIDE 0.9 % IV SOLN: Freq: Once | INTRAVENOUS | Status: AC

## 2022-07-19 MED ORDER — SODIUM CHLORIDE 0.9% FLUSH
10.0000 mL | Freq: Once | INTRAVENOUS | Status: AC
  Administered 2022-07-19: 10 mL

## 2022-07-19 MED ORDER — SODIUM CHLORIDE 0.9 % IV SOLN
200.0000 mg | Freq: Once | INTRAVENOUS | Status: AC
  Administered 2022-07-19: 200 mg via INTRAVENOUS
  Filled 2022-07-19: qty 8

## 2022-07-19 MED ORDER — SODIUM CHLORIDE 0.9% FLUSH
10.0000 mL | INTRAVENOUS | Status: DC | PRN
  Administered 2022-07-19: 10 mL

## 2022-07-19 MED ORDER — HEPARIN SOD (PORK) LOCK FLUSH 100 UNIT/ML IV SOLN
500.0000 [IU] | Freq: Once | INTRAVENOUS | Status: AC | PRN
  Administered 2022-07-19: 500 [IU]

## 2022-07-19 MED ORDER — SODIUM CHLORIDE 0.9 % IV SOLN
200.0000 mg | Freq: Once | INTRAVENOUS | Status: AC
  Administered 2022-07-19: 200 mg via INTRAVENOUS
  Filled 2022-07-19: qty 200

## 2022-07-19 NOTE — Patient Instructions (Signed)
[facility]  Discharge Instructions: Thank you for choosing [facility] to provide your oncology and hematology care.   If you have a lab appointment with the [facility], please go directly to the [facility] and check in at the registration area.   Wear comfortable clothing and clothing appropriate for easy access to any Portacath or PICC line.   We strive to give you quality time with your provider. You may need to reschedule your appointment if you arrive late (15 or more minutes).  Arriving late affects you and other patients whose appointments are after yours.  Also, if you miss three or more appointments without notifying the office, you may be dismissed from the clinic at the provider's discretion.      For prescription refill requests, have your pharmacy contact our office and allow 72 hours for refills to be completed.    Today you received the following chemotherapy and/or immunotherapy agents Keytruda.      To help prevent nausea and vomiting after your treatment, we encourage you to take your nausea medication as directed.  BELOW ARE SYMPTOMS THAT SHOULD BE REPORTED IMMEDIATELY: *FEVER GREATER THAN 100.4 F (38 C) OR HIGHER *CHILLS OR SWEATING *NAUSEA AND VOMITING THAT IS NOT CONTROLLED WITH YOUR NAUSEA MEDICATION *UNUSUAL SHORTNESS OF BREATH *UNUSUAL BRUISING OR BLEEDING *URINARY PROBLEMS (pain or burning when urinating, or frequent urination) *BOWEL PROBLEMS (unusual diarrhea, constipation, pain near the anus) TENDERNESS IN MOUTH AND THROAT WITH OR WITHOUT PRESENCE OF ULCERS (sore throat, sores in mouth, or a toothache) UNUSUAL RASH, SWELLING OR PAIN  UNUSUAL VAGINAL DISCHARGE OR ITCHING   Items with * indicate a potential emergency and should be followed up as soon as possible or go to the Emergency Department if any problems should occur.  Please show the CHEMOTHERAPY ALERT CARD or IMMUNOTHERAPY ALERT CARD at  check-in to the Emergency Department and triage nurse.  Should you have questions after your visit or need to cancel or reschedule your appointment, please contact [facility]  Dept: [phone]  and follow the prompts.  Office hours are 8:00 a.m. to 4:30 p.m. Monday - Friday. Please note that voicemails left after 4:00 p.m. may not be returned until the following business day.  We are closed weekends and major holidays. You have access to a nurse at all times for urgent questions. Please call the main number to the clinic Dept: [phone] and follow the prompts.   For any non-urgent questions, you may also contact your provider using MyChart. We now offer e-Visits for anyone 52 and older to request care online for non-urgent symptoms. For details visit [email or web address].   Also download the MyChart app! Go to the app store, search "MyChart", open the app, select [facility], and log in with your MyChart username and password.

## 2022-07-19 NOTE — Progress Notes (Signed)
[place]   [phone] [phone]   Clinic Follow up Note   Patient Care Team: [name], MD as PCP - General (Internal Medicine) [name], MD as PCP - Cardiology (Cardiology) [name], [name] as Physician Assistant (Emergency Medicine) [name], MD as Consulting Physician (Oncology) [name], MD as Consulting Physician (General Surgery) [name], [name], MD as Consulting Physician (Neurology) [name], [name], MD as Consulting Physician (Obstetrics and Gynecology) [name], MD as Consulting Physician (Gastroenterology) [name], [name], NP as Nurse Practitioner (Nurse Practitioner)  Date of Service:  07/19/2022  CHIEF COMPLAINT: f/u of colon cancer   CURRENT THERAPY:  -Keytruda, q21 days, starting 09/08/21 -Iv Venofer as needed if ferritin <100  ASSESSMENT:  Mikayla Rios is a 37 y.o. 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 She has been tolerating well overall but has developed joint pain which could be related, overall manageable. She is clinically doing better also with more energy and less abdominal pain   -her CEA has normalized on immunotherapy. -CT CAP on 03/15/22 again showed mixed response -she had PET scan on 04/26/2022 for evaluation of her ovarian cysts, and saw GYN Dr. Ernestina Patches, we decided to monitor it. -she is clinically doing well, will repeat scan in 2-3 weeks  -She tolerating Keytruda well, with mild joint pain.  She recently started workout at the gym, which has helped her joint pain.  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  -we previously discussed testing for her children when they are of age.      PLAN: -lab reviewed - Order restaging CT Scan 2-3 weeks -proceed with C9 keytruda and IV iron today -lab,flu  and keytuda and venofer 08/11/2022  SUMMARY OF ONCOLOGIC HISTORY: Oncology History Overview Note   Cancer Staging  Malignant neoplasm of sigmoid colon [facility]) Staging form: Colon and Rectum, AJCC 8th Edition - Pathologic stage from 04/15/2021: Stage IIC (pT4b, pN0, cM0) - Signed by [name], MD on 06/12/2021    Malignant neoplasm of sigmoid colon [facility])   Initial Diagnosis   Malignant neoplasm of sigmoid colon ([place])   11/04/2020 Imaging   EXAM: CT ABDOMEN AND PELVIS WITHOUT CONTRAST  IMPRESSION: 1. Perforating descending colonic diverticulitis with multiple abdominopelvic gas and fluid collections, measuring up to 5.7 cm and further described above. 2. Cholelithiasis without findings of acute cholecystitis. 3. 3.6 cm benign left adrenal adenoma. 4. Leiomyomatous uterus. 5. 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   11/07/2020 Imaging   EXAM: CT ABDOMEN AND PELVIS WITHOUT CONTRAST  IMPRESSION: Continued wall thickening is seen involving descending colon suggesting infectious or inflammatory colitis or perforated diverticulitis. There is an adjacent fluid collection measuring 7.0 x 5.0 cm consistent with abscess which is significantly enlarged compared to prior exam. Adjacent to the abscess, there is a severely thickened small bowel loop most consistent with secondary inflammation.   5.2 x 3.5 cm fluid collection is noted within the left  psoas muscle consistent with abscess which is significantly enlarged compared  to prior exam. 4.4 x 3.8 cm fluid collection consistent with abscess is noted in the left retroperitoneal region which is also enlarged compared to prior exam.   Fibroid uterus.   Cholelithiasis.   11/27/2020 Imaging   EXAM: CT ABDOMEN AND PELVIS WITHOUT CONTRAST  IMPRESSION: 1. Significantly interval decreased size of the previously visualized left retroperitoneal and left anterior mid abdominal fluid collections. There is persistent fat stranding and mural thickening of the mid descending colon at this level. 2. Unchanged leiomyomatous uterus. 3. Unchanged cholelithiasis.   12/11/2020 Imaging   EXAM: CT ABDOMEN AND PELVIS WITHOUT CONTRAST  IMPRESSION: 1. Stable inflammatory changes centered around the distal descending colon in the left lower abdomen. Pericolonic inflammatory changes have minimally changed since 11/27/2020. Small pocket of gas medial to the colon is probably associated with a fistula or small residual abscess collection. 2. Stable position of the two percutaneous drains. The more anterior drain may be extending through a portion of the small bowel. 3. Cholelithiasis. 4. Fibroid uterus. Cannot exclude an ovarian/adnexal cystic structure near the uterine fundus. 5. Left adrenal adenoma.   01/07/2021 Imaging   EXAM: CT ABDOMEN AND PELVIS WITH CONTRAST  IMPRESSION: 1. No new abscesses identified. Similar degree of soft tissue thickening seen in the left pericolonic region. The degree of persistent soft tissues thickening in the pericolonic space further raises suspicions for malignancy. Further evaluation with colonoscopy should be performed. 2. Left retroperitoneal abscess drain unchanged in position. Anterior left abdominal drain again seen terminating within small bowel loop. 3. 2.6 cm mildly sclerotic lesion noted in the L2 vertebral body. Further evaluation with contrast enhanced lumbar spine MRI should be performed.   02/04/2021 Imaging   EXAM: CT  ABDOMEN AND PELVIS WITH CONTRAST  IMPRESSION: No new abdominopelvic collections or abscess development in the 1 month interval.   Left anterior drain remains within a loop of small bowel, unchanged.   Left lateral abscess drain remains in the retroperitoneal space adjacent to the iliopsoas muscle with a small amount of residual fluid and air but no measurable collection.   Stable soft tissue prominence and pericolonic strandy edema/inflammation about the left descending colon compatible with residual diverticulitis/colitis. Difficult to exclude underlying transmural lesion.   04/01/2021 Imaging   EXAM: CT ABDOMEN AND PELVIS WITH CONTRAST  IMPRESSION: There appears to be significant enhancement and wall thickening involving the descending colon with some degree of traction and involvement of adjacent small bowel loops. This is consistent with the history of diverticulitis and perforation. Stable position of percutaneous drainage catheter is seen adjacent to left psoas muscle with no significant residual fluid remaining. The other percutaneous drainage catheter that was previously noted to be within small bowel loop on prior exam in the left lower quadrant, has significantly retracted and appears to be outside of the peritoneal space at this time.   Since the prior exam, there does appear to be some degree of rotation involving mesenteric vessels and structures in the right lower quadrant, suggesting partial volvulus or malrotation. There is the interval development of several lymph nodes in this area, most likely inflammatory or reactive in etiology. Mild amount of free fluid is also noted in the pelvis. However, no significant bowel wall thickening or dilatation is seen in this area. These results will be called to the ordering clinician or representative by the Radiologist Assistant, and communication documented in the PACS or zVision Dashboard.   Stable uterine fibroid.  Hepatic steatosis.   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   Cholelithiasis.   04/10/2021 Imaging   EXAM: CT ANGIOGRAPHY CHEST CT ABDOMEN AND PELVIS WITH CONTRAST  IMPRESSION: 1. Again seen are findings compatible with descending colon diverticulitis with perforation. Free air and inflammation have increased in the interval. 2. New lobulated enhancing fluid collection posterior to this segment of inflamed colon measuring 8.5 x 3.5 x 10.0 cm. This collection now invades the adjacent iliopsoas muscle as well as extends through the left lateral abdominal wall. The tip of the drainage catheter is in this collection. Findings are compatible with abscess. 3. Anterior left percutaneous drainage catheter tip has been pulled back and is now within the subcutaneous tissues. 4. Trace free fluid. 5. No pulmonary embolism.  No acute cardiopulmonary process. 6. Cholelithiasis. 7. Fatty infiltration of the liver.   04/15/2021 Definitive Surgery   FINAL MICROSCOPIC DIAGNOSIS:   A. SMALL BOWEL, RESECTION:  - Adenocarcinoma.  - No carcinoma identified in 1 lymph node.   B. PERFORATED LEFT COLON, RESECTION:  - Moderately differentiated colonic adenocarcinoma.  - Tumor extends into pericolonic adipose tissue, and is strongly  suggestive of invasion into small bowel.  See oncology table/comments.  - No carcinoma identified in 4 lymph nodes (0/4).  - Tubular adenoma with high-grade dysplasia, 1.  - Tubular adenomas with low grade dysplasia, 3.   Comments: The size of the tumor is difficult to estimate secondary to the disrupted nature of the specimen, as well as the infiltrative nature of the tumor.  Tumor can be identified as definitely invading into the pericolonic adipose tissue (block B4), but given the extreme disruption of the tissue, and the involvement of the small bowel by what appears to be a colonic adenocarcinoma, I favor perforation of the large bowel with direct invasion into the  small bowel.  Accordingly, I believe this is best regarded as a pT4b lesion.   ADDENDUM:  Mismatch Repair Protein (IHC)  SUMMARY INTERPRETATION: ABNORMAL  There is loss of the major MMR protein [name]6: This indicates a high probability that a hereditary germline mutation is present and referral to genetic counseling is warranted. It is recommended that the loss of protein expression be correlated with molecular based microsatellite instability testing.   IHC EXPRESSION RESULTS  TEST           RESULT  MLH1:          Preserved nuclear expression  [name]2:          Preserved nuclear expression  [name]6:          LOSS OF NUCLEAR EXPRESSION  PMS2:          Preserved nuclear expression    04/15/2021 Cancer Staging   Staging form: Colon and Rectum, AJCC 8th Edition - Pathologic stage from 04/15/2021: Stage IIC (pT4b, pN0, cM0) - Signed by [name], MD on 06/12/2021 Stage prefix: Initial diagnosis Total positive nodes: 0 Histologic grading system: 4 grade system Histologic grade (G): G2 Residual tumor (R): R0 - None   04/29/2021 Imaging   EXAM: CT ABDOMEN AND PELVIS WITH CONTRAST  IMPRESSION: Continued left retroperitoneal abscesses inferior to the left kidney, involving the left psoas muscle and left abdominal wall musculature. Overall size is decreased since prior study. Interval removal of left lower quadrant abscess drainage catheter.   New fluid collection in the cul-de-sac and wrapping around the uterus concerning for abscess.   Cholelithiasis.   Small left pleural effusion.  Bibasilar atelectasis.   Stable  left adrenal adenoma.  ADDENDUM: After discussing the case with Dr. Dwaine Gale in interventional radiology, it was noted that the fluid in the pelvis originally thought to be in the cul-de-sac is likely within the vagina, best seen on sagittal imaging. Recommend speculum exam.   Also, in the left lateral wall in the area of prior abscess in the lateral wall abdominal musculature, some  of the abdomen soft tissue appears to be enhancing. While this could be infectious, cannot exclude tumor seeding in the left lateral abdominal wall, measuring 6.9 x 3.8 cm on image 64 of series 2.   06/16/2021 Imaging   EXAM: CT ABDOMEN AND PELVIS WITH CONTRAST  IMPRESSION: Increased size of bulky soft tissue masses involving the left psoas muscle and left lateral abdominal wall soft tissues, consistent with progressive metastatic disease.   Significant progression of multiple peritoneal masses throughout the abdomen and pelvis, consistent with peritoneal carcinomatosis.   New mild retroperitoneal lymphadenopathy, consistent with metastatic disease.   Stable uterine fibroid and left adrenal adenoma.   Cholelithiasis, without evidence of cholecystitis.   06/22/2021 - 08/19/2021 Chemotherapy   Patient is on Treatment Plan : COLORECTAL FOLFOX q14d x 6 months     06/22/2021 Tumor Marker   Patient's tumor was tested for the following markers: CEA. Results of the tumor marker test revealed 87.41.   07/10/2021 Pathology Results   FINAL MICROSCOPIC DIAGNOSIS:   A. PERITONEAL MASS, LEFT UPPER ABDOMINAL QUADRANT, BIOPSY:  -  Metastatic adenocarcinoma with necrosis, histologically consistent with colon primary.     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 The report date is 07/16/2021. The remainder of testing was negative/normal.  The CancerNext-Expanded + RNAinsight gene panel offered by [employer] and includes sequencing and rearrangement analysis for the following 77 genes: IP, ALK, APC*, ATM*, AXIN2, BAP1, BARD1, BLM, BMPR1A, BRCA1*, BRCA2*, BRIP1*, CDC73, CDH1*,CDK4, CDKN1B, CDKN2A, CHEK2*, CTNNA1, DICER1, FANCC, FH, FLCN, GALNT12, KIF1B, LZTR1, MAX, MEN1, MET, MLH1*, [name]2*, [name]3, [name]6*, MUTYH*, NBN, NF1*, NF2, NTHL1, PALB2*, PHOX2B, PMS2*, POT1, PRKAR1A, PTCH1, PTEN*, RAD51C*, RAD51D*,RB1, RECQL, RET, SDHA,  SDHAF2, SDHB, SDHC, SDHD, SMAD4, SMARCA4, SMARCB1, SMARCE1, STK11, SUFU, TMEM127, TP53*,TSC1, TSC2, VHL and XRCC2 (sequencing and deletion/duplication); EGFR, EGLN1, HOXB13, KIT, MITF, PDGFRA, POLD1 and POLE (sequencing only); EPCAM and GREM1 (deletion/duplication only).   08/28/2021 Imaging   EXAM: CT CHEST, ABDOMEN, AND PELVIS WITH CONTRAST  IMPRESSION: 1. Continued interval progression of multiple metastatic soft tissue nodules/masses in the abdomen and pelvis. 2. No evidence for metastatic disease in the chest. 3. Similar appearance of the subtle lesion in the L2 vertebral body, suspicious for metastatic involvement. 4. Cholelithiasis.   09/03/2021 -  Chemotherapy   Patient is on Treatment Plan : COLORECTAL Pembrolizumab (200) q21d     09/08/2021 - 02/01/2022 Chemotherapy   Patient is on Treatment Plan : COLORECTAL Pembrolizumab (200) q21d     01/08/2022 Imaging   CT CAP IMPRESSION: 1. Mixed response to therapy. Interval decrease in size of the conglomerate multilobulated mass extending from the left psoas into the left posterior pararenal space through the left lateral abdominal wall along the tract of the previously placed percutaneous drainage catheter. Interval decrease in size of right adnexal mass and omental masses within the right lower quadrant of the abdomen. Interval development of peritoneal carcinomatosis with omental caking within the left lower quadrant of the abdomen and left subdiaphragmatic region adjacent to the spleen and along the left pericolic gutter as well as development  of ascites appearing loculated within the anterior peritoneum. 2. Interval development of mild left hydronephrosis secondary to stricturing of the mid left ureter. 3. Stable left adrenal metastasis. 4. Stable sclerotic lesion within the L2 vertebral body. No new lytic or blastic bone lesions are seen. 5. Cholelithiasis. 6. No evidence of intrathoracic metastatic disease. 7. Left lower quadrant  descending colostomy and Hartmann pouch formation. 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 Moderate colonic stool burden. No evidence of obstruction.   03/15/2022 Imaging   IMPRESSION: 1. Status post sigmoid colon resection with left lower quadrant end colostomy. 2. Multicystic bilateral ovarian lesions are slightly increased in size. 3. Diminished, small volume of loculated appearing ascites throughout the abdomen and pelvis, with similar appearance of peritoneal thickening and nodularity throughout. 4. Heterogeneously calcified, conglomerate masses in the left retroperitoneum and abdominal wall are slightly diminished in size. 5. Left adrenal lesion not significantly changed. 6. Unchanged faintly sclerotic metastatic lesion of L2. 7. Constellation of findings is consistent with mixed response to treatment however with overall little interval change. 8. Minimal residual left hydronephrosis and proximal hydroureter, improved compared to prior examination. 9. Cholelithiasis. 10. Coronary artery disease, significantly advanced for patient age.   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 Pt states she has no pain and it has healed. Pt reports her energy level is better. Pt states after treatment she is tired but te next day she is ok. Pt states she has being going to Gym, She is able to her ADL'S.She has some joint paint and she does exercise to manage.      All other systems were reviewed with the patient and are negative.  MEDICAL HISTORY:  Past Medical History:  Diagnosis Date   Allergy    seasonal   Anemia    Blood infection 1985    Blood transfusion without reported diagnosis    Diverticulitis    Family history of breast cancer    Family history of colon cancer    Family history of colon cancer    Family history of stomach cancer    Hypertension    Obesity    Sleep apnea     SURGICAL HISTORY: Past Surgical History:  Procedure Laterality Date   COLECTOMY WITH COLOSTOMY CREATION/HARTMANN PROCEDURE N/A 04/15/2021   Procedure: COLOSTOMY CREATION/HARTMANN PROCEDURE;  Surgeon: [name], MD;  Location: [facility];  Service: General;  Laterality: N/A;   IR CATHETER TUBE CHANGE  12/12/2020   IR CATHETER TUBE CHANGE  01/27/2021   IR CATHETER TUBE CHANGE  02/19/2021   IR CATHETER TUBE CHANGE  04/13/2021   IR IMAGING GUIDED PORT INSERTION  10/02/2021   IR RADIOLOGIST EVAL & MGMT  11/27/2020   IR RADIOLOGIST EVAL & MGMT  12/11/2020   IR RADIOLOGIST EVAL & MGMT  01/07/2021   IR RADIOLOGIST EVAL & MGMT  02/04/2021   IR SINUS/FIST TUBE CHK-NON GI  12/12/2020   IR SINUS/FIST TUBE CHK-NON GI  02/19/2021   LAPAROSCOPIC PARTIAL COLECTOMY N/A 04/15/2021   Procedure: LAPAROSCOPIC ASSISTED HARTMANN RESECTION;  Surgeon: [name], MD;  Location: [facility];  Service: General;  Laterality: N/A;    I have reviewed the social history and family history with the patient and they are unchanged from previous note.  ALLERGIES:  is allergic to chlorhexidine gluconate, shellfish allergy, and penicillins.  MEDICATIONS:  Current Outpatient Medications  Medication Sig Dispense Refill   ALPRAZolam (XANAX) 0.25 MG tablet Take 1 tablet (0.25 mg total) by mouth at bedtime as needed for anxiety. 30 tablet 0   amLODipine (NORVASC) 10 MG tablet TAKE 1 TABLET BY MOUTH EVERY DAY 90 tablet 3   DULoxetine (CYMBALTA) 60 MG capsule TAKE 1 CAPSULE BY MOUTH EVERY DAY 90 capsule 2   lidocaine-prilocaine (EMLA) cream Apply 1 Application topically as needed. 30 g 3   metoprolol tartrate 75 MG TABS Take 75 mg by mouth 2 (two) times daily. 90 tablet 3    oxyCODONE-acetaminophen (PERCOCET) 7.5-325 MG tablet Take 1 tablet by mouth every 8 (eight) hours as needed for severe pain. 30 tablet 0   POTASSIUM PO Take by mouth.     No current facility-administered medications for this visit.   Facility-Administered Medications Ordered in Other Visits  Medication Dose Route Frequency Provider Last Rate Last Admin   fentaNYL (SUBLIMAZE) injection   Intravenous PRN Mir, Paula Libra, MD   50 mcg at 10/02/21 1436   heparin lock flush 100 unit/mL  500 Units Intracatheter Once PRN [name], MD       iron sucrose (VENOFER) 200 mg in sodium chloride 0.9 % 100 mL IVPB  200 mg Intravenous Once [name], MD       midazolam (VERSED) injection   Intravenous PRN Mir, Paula Libra, MD   1 mg at 10/02/21 1436   pembrolizumab (KEYTRUDA) 200 mg in sodium chloride 0.9 % 50 mL chemo infusion  200 mg Intravenous Once [name], MD       sodium chloride flush (NS) 0.9 % injection 10 mL  10 mL Intracatheter PRN [name], MD        PHYSICAL EXAMINATION: ECOG PERFORMANCE STATUS: 1 - Symptomatic but completely ambulatory  Vitals:   07/19/22 0835  BP: 128/81  Pulse: 85  Resp: 18  Temp: 98.6 F (37 C)  SpO2: 100%   Wt Readings from Last 3 Encounters:  07/19/22 243 lb 14.4 oz (110.6 kg)  06/29/22 234 lb 8 oz (106.4 kg)  06/10/22 228 lb 14.4 oz (103.8 kg)       LUNGS:(-)  clear to auscultation and percussion with normal breathing effort HEART: (-) regular rate & rhythm and no murmurs and no lower extremity edema ABDOMEN:(-) abdomen soft, non-tender and normal bowel sounds Some scar tissue    LABORATORY DATA:  I have reviewed the data as listed    Latest Ref Rng & Units 07/19/2022    8:08 AM 06/29/2022   11:25 AM 06/10/2022   12:14 PM  CBC  WBC 4.0 - 10.5 K/uL 6.2  6.7  5.2   Hemoglobin 12.0 - 15.0 g/dL 10.7  11.3  10.8   Hematocrit 36.0 - 46.0 % 33.2  34.4  32.5   Platelets 150 - 400 K/uL 340  434  358         Latest Ref Rng & Units 07/19/2022    8:08 AM  06/29/2022   11:25 AM 06/10/2022   12:14 PM  CMP  Glucose 70 - 99 mg/dL 95  92  81   BUN 6 - 20 mg/dL 18  21  15   $ Creatinine 0.44 - 1.00 mg/dL 0.67  0.84  0.67   Sodium 135 - 145 mmol/L 137  140  139   Potassium 3.5 - 5.1 mmol/L 4.0  3.9  4.1   Chloride 98 - 111 mmol/L 109  110  108   CO2 22 - 32 mmol/L 22  24  26   $ Calcium 8.9 - 10.3 mg/dL 8.7  8.9  8.9   Total Protein 6.5 - 8.1 g/dL 6.9  7.0  6.7   Total Bilirubin 0.3 - 1.2 mg/dL 0.3  0.2  0.2   Alkaline Phos 38 - 126 U/L 64  77  73   AST 15 - 41 U/L 14  13  14   $ ALT 0 - 44 U/L 12  11  12       $ RADIOGRAPHIC STUDIES: I have personally reviewed the radiological images as listed and agreed with the findings in the report. No results found.    Orders Placed This Encounter  Procedures   CT CHEST ABDOMEN PELVIS W CONTRAST    Standing Status:   Future    Standing Expiration Date:   07/20/2023    Order Specific Question:   Is patient pregnant?    Answer:   No    Order Specific Question:   Preferred imaging location?    Answer:   [facility] [place]    Order Specific Question:   Is Oral Contrast requested for this exam?    Answer:   Yes, Per Radiology protocol   CBC with Differential ([place] Only)    Standing Status:   Future    Standing Expiration Date:   09/02/2023   CMP ([place] only)    Standing Status:   Future    Standing Expiration Date:   09/02/2023   All questions were answered. The patient knows to call the clinic with any problems, questions or concerns. No barriers to learning was detected. The total time spent in the appointment was 30 minutes.     [name], MD 07/19/2022   Felicity Coyer, CMA, am acting as scribe for [name], MD.   I have reviewed the above documentation for accuracy and completeness, and I agree with the above.

## 2022-07-20 ENCOUNTER — Encounter: Payer: Self-pay | Admitting: Hematology

## 2022-07-20 ENCOUNTER — Inpatient Hospital Stay: Payer: BC Managed Care – PPO

## 2022-07-21 ENCOUNTER — Other Ambulatory Visit: Payer: Self-pay

## 2022-07-21 DIAGNOSIS — C786 Secondary malignant neoplasm of retroperitoneum and peritoneum: Secondary | ICD-10-CM

## 2022-07-21 DIAGNOSIS — C187 Malignant neoplasm of sigmoid colon: Secondary | ICD-10-CM

## 2022-07-21 LAB — T4: T4, Total: 7.5 ug/dL (ref 4.5–12.0)

## 2022-07-22 ENCOUNTER — Encounter: Payer: Self-pay | Admitting: Internal Medicine

## 2022-07-22 ENCOUNTER — Ambulatory Visit (INDEPENDENT_AMBULATORY_CARE_PROVIDER_SITE_OTHER): Payer: BC Managed Care – PPO | Admitting: Internal Medicine

## 2022-07-22 VITALS — BP 138/86 | HR 98 | Temp 98.0°F | Ht 63.0 in | Wt 238.4 lb

## 2022-07-22 DIAGNOSIS — C187 Malignant neoplasm of sigmoid colon: Secondary | ICD-10-CM

## 2022-07-22 DIAGNOSIS — I10 Essential (primary) hypertension: Secondary | ICD-10-CM

## 2022-07-22 DIAGNOSIS — D649 Anemia, unspecified: Secondary | ICD-10-CM | POA: Diagnosis not present

## 2022-07-22 DIAGNOSIS — Z6841 Body Mass Index (BMI) 40.0 and over, adult: Secondary | ICD-10-CM

## 2022-07-22 NOTE — Patient Instructions (Signed)
Hypertension, Adult ?Hypertension is another name for high blood pressure. High blood pressure forces your heart to work harder to pump blood. This can cause problems over time. ?There are two numbers in a blood pressure reading. There is a top number (systolic) over a bottom number (diastolic). It is best to have a blood pressure that is below 120/80. ?What are the causes? ?The cause of this condition is not known. Some other conditions can lead to high blood pressure. ?What increases the risk? ?Some lifestyle factors can make you more likely to develop high blood pressure: ?Smoking. ?Not getting enough exercise or physical activity. ?Being overweight. ?Having too much fat, sugar, calories, or salt (sodium) in your diet. ?Drinking too much alcohol. ?Other risk factors include: ?Having any of these conditions: ?Heart disease. ?Diabetes. ?High cholesterol. ?Kidney disease. ?Obstructive sleep apnea. ?Having a family history of high blood pressure and high cholesterol. ?Age. The risk increases with age. ?Stress. ?What are the signs or symptoms? ?High blood pressure may not cause symptoms. Very high blood pressure (hypertensive crisis) may cause: ?Headache. ?Fast or uneven heartbeats (palpitations). ?Shortness of breath. ?Nosebleed. ?Vomiting or feeling like you may vomit (nauseous). ?Changes in how you see. ?Very bad chest pain. ?Feeling dizzy. ?Seizures. ?How is this treated? ?This condition is treated by making healthy lifestyle changes, such as: ?Eating healthy foods. ?Exercising more. ?Drinking less alcohol. ?Your doctor may prescribe medicine if lifestyle changes do not help enough and if: ?Your top number is above 130. ?Your bottom number is above 80. ?Your personal target blood pressure may vary. ?Follow these instructions at home: ?Eating and drinking ? ?If told, follow the DASH eating plan. To follow this plan: ?Fill one half of your plate at each meal with fruits and vegetables. ?Fill one fourth of your plate  at each meal with whole grains. Whole grains include whole-wheat pasta, brown rice, and whole-grain bread. ?Eat or drink low-fat dairy products, such as skim milk or low-fat yogurt. ?Fill one fourth of your plate at each meal with low-fat (lean) proteins. Low-fat proteins include fish, chicken without skin, eggs, beans, and tofu. ?Avoid fatty meat, cured and processed meat, or chicken with skin. ?Avoid pre-made or processed food. ?Limit the amount of salt in your diet to less than 1,500 mg each day. ?Do not drink alcohol if: ?Your doctor tells you not to drink. ?You are pregnant, may be pregnant, or are planning to become pregnant. ?If you drink alcohol: ?Limit how much you have to: ?0-1 drink a day for women. ?0-2 drinks a day for men. ?Know how much alcohol is in your drink. In the [place] one drink equals one 12 oz bottle of beer (355 mL), one 5 oz glass of wine (148 mL), or one 1? oz glass of hard liquor (44 mL). ?Lifestyle ? ?Work with your doctor to stay at a healthy weight or to lose weight. Ask your doctor what the best weight is for you. ?Get at least 30 minutes of exercise that causes your heart to beat faster (aerobic exercise) most days of the week. This may include walking, swimming, or biking. ?Get at least 30 minutes of exercise that strengthens your muscles (resistance exercise) at least 3 days a week. This may include lifting weights or doing Pilates. ?Do not smoke or use any products that contain nicotine or tobacco. If you need help quitting, ask your doctor. ?Check your blood pressure at home as told by your doctor. ?Keep all follow-up visits. ?Medicines ?Take over-the-counter and prescription medicines   only as told by your doctor. Follow directions carefully. ?Do not skip doses of blood pressure medicine. The medicine does not work as well if you skip doses. Skipping doses also puts you at risk for problems. ?Ask your doctor about side effects or reactions to medicines that you should watch  for. ?Contact a doctor if: ?You think you are having a reaction to the medicine you are taking. ?You have headaches that keep coming back. ?You feel dizzy. ?You have swelling in your ankles. ?You have trouble with your vision. ?Get help right away if: ?You get a very bad headache. ?You start to feel mixed up (confused). ?You feel weak or numb. ?You feel faint. ?You have very bad pain in your: ?Chest. ?Belly (abdomen). ?You vomit more than once. ?You have trouble breathing. ?These symptoms may be an emergency. Get help right away. Call 911. ?Do not wait to see if the symptoms will go away. ?Do not drive yourself to the hospital. ?Summary ?Hypertension is another name for high blood pressure. ?High blood pressure forces your heart to work harder to pump blood. ?For most people, a normal blood pressure is less than 120/80. ?Making healthy choices can help lower blood pressure. If your blood pressure does not get lower with healthy choices, you may need to take medicine. ?This information is not intended to replace advice given to you by your health care provider. Make sure you discuss any questions you have with your health care provider. ?Document Revised: 03/12/2021 Document Reviewed: 03/12/2021 ?Elsevier Patient Education ? 2023 [employer] ? ?

## 2022-07-22 NOTE — Progress Notes (Signed)
Barnet Glasgow Martin,acting as a Education administrator for Maximino Greenland, MD.,have documented all relevant documentation on the behalf of Maximino Greenland, MD,as directed by  Maximino Greenland, MD while in the presence of Maximino Greenland, MD.    Subjective:     Patient ID: Mikayla Rios , female    DOB: March 06, 1986 , 37 y.o.   MRN: [id number]   Chief Complaint  Patient presents with   Hypertension    HPI  Patient presents today for a BP check.  She is accompanied by her husband, Roderick Pee today. Patient states she has stopped taking her bp meds because she was on too many medications and her head was foggy. She is still under the care of Oncology for colon cancer.    BP Readings from Last 3 Encounters: 07/22/22 : 138/86 07/19/22 : 138/86 07/19/22 : 128/81       Past Medical History:  Diagnosis Date   Allergy    seasonal   Anemia    Blood infection 1985   Blood transfusion without reported diagnosis    colon ca 04/2021   Diverticulitis    Family history of breast cancer    Family history of colon cancer    Family history of colon cancer    Family history of stomach cancer    Hypertension    Obesity    Sleep apnea      Family History  Problem Relation Age of Onset   Diabetes Mother    Hypertension Mother    Colon cancer Mother        dx at age 34   Breast cancer Mother    Congestive Heart Failure Father    Hypertension Father    Diabetes Maternal Grandmother    Hypertension Maternal Grandmother    Diabetes Maternal Grandfather    Kidney disease Maternal Grandfather    Diabetes Paternal Grandmother    Autism Son    Stomach cancer Maternal Uncle    Heart disease Paternal Aunt    Colon polyps Half-Sister        colectomy   Ovarian cancer Neg Hx    Endometrial cancer Neg Hx    Pancreatic cancer Neg Hx    Prostate cancer Neg Hx      Current Outpatient Medications:    ALPRAZolam (XANAX) 0.25 MG tablet, Take 1 tablet (0.25 mg total) by mouth at bedtime as needed for anxiety.,  Disp: 30 tablet, Rfl: 0   amLODipine (NORVASC) 10 MG tablet, TAKE 1 TABLET BY MOUTH EVERY DAY, Disp: 90 tablet, Rfl: 3   DULoxetine (CYMBALTA) 60 MG capsule, TAKE 1 CAPSULE BY MOUTH EVERY DAY, Disp: 90 capsule, Rfl: 2   lidocaine-prilocaine (EMLA) cream, Apply 1 Application topically as needed., Disp: 30 g, Rfl: 3   metoprolol tartrate 75 MG TABS, Take 75 mg by mouth 2 (two) times daily., Disp: 90 tablet, Rfl: 3   oxyCODONE-acetaminophen (PERCOCET) 7.5-325 MG tablet, Take 1 tablet by mouth every 8 (eight) hours as needed for severe pain., Disp: 30 tablet, Rfl: 0   POTASSIUM PO, Take by mouth., Disp: , Rfl:  No current facility-administered medications for this visit.  Facility-Administered Medications Ordered in Other Visits:    fentaNYL (SUBLIMAZE) injection, , Intravenous, PRN, Mir, Paula Libra, MD, 50 mcg at 10/02/21 1436   midazolam (VERSED) injection, , Intravenous, PRN, Mir, Paula Libra, MD, 1 mg at 10/02/21 1436   Allergies  Allergen Reactions   Chlorhexidine Gluconate Hives and Itching    CHG wipes cause  itching and hives requiring benadryl   Shellfish Allergy Hives   Penicillins Hives and Rash    Has patient had a PCN reaction causing immediate rash, facial/tongue/throat swelling, SOB or lightheadedness with hypotension: No Has patient had a PCN reaction causing severe rash involving mucus membranes or skin necrosis: hives Has patient had a PCN reaction that required hospitalization No Has patient had a PCN reaction occurring within the last 10 years: yes If all of the above answers are "NO", then may proceed with Cephalosporin use.      Review of Systems  Constitutional: Negative.   HENT: Negative.    Eyes: Negative.   Respiratory: Negative.    Cardiovascular: Negative.   Gastrointestinal: Negative.      Today's Vitals   07/22/22 1616  BP: 138/86  Pulse: 98  Temp: 98 F (36.7 C)  TempSrc: Oral  Weight: 238 lb 6.4 oz (108.1 kg)  Height: 5\' 3"  (1.6 m)  PainSc: 0-No  pain   Body mass index is 42.23 kg/m.  Wt Readings from Last 3 Encounters:  08/11/22 245 lb 9.6 oz (111.4 kg)  07/22/22 238 lb 6.4 oz (108.1 kg)  07/19/22 243 lb 14.4 oz (110.6 kg)   BP Readings from Last 3 Encounters:  08/11/22 (!) 122/90  07/22/22 138/86  07/19/22 138/86    Objective:  Physical Exam Vitals and nursing note reviewed.  Constitutional:      Appearance: Normal appearance. She is obese.  HENT:     Head: Normocephalic and atraumatic.     Nose:     Comments: Masked     Mouth/Throat:     Comments: Masked  Eyes:     Extraocular Movements: Extraocular movements intact.  Cardiovascular:     Rate and Rhythm: Normal rate and regular rhythm.     Heart sounds: Normal heart sounds.  Pulmonary:     Effort: Pulmonary effort is normal.     Breath sounds: Normal breath sounds.  Abdominal:     Comments: LUQ colostomy  Musculoskeletal:     Cervical back: Normal range of motion.  Skin:    General: Skin is warm.  Neurological:     General: No focal deficit present.     Mental Status: She is alert.  Psychiatric:        Mood and Affect: Mood normal.        Behavior: Behavior normal.      Assessment And Plan:     1. Essential hypertension Comments: She is encouraged to follow low sodium diet.  I plan to see her within 3-4 months for re-evaluation. Goal BP is less than 130/80. She is encouraged to gradually increase her daily activity.   2. Malignant neoplasm of sigmoid colon ([place]) Comments: Onc notes reviewed.  She is s/p partial colectomy w/ colostomy.  Immunotherapy has stabilized her condition. She will c/w Keytruda per Oncology.  3. Anemia, unspecified type Comments: Feb 2024 [place] labs reviewed. she is encouraged to include iron rich foods into her diet.  4. 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    Patient was given opportunity to ask questions. Patient verbalized understanding of the plan and was able to repeat key elements of the plan. All questions were answered to their satisfaction.   I, Maximino Greenland, MD, have reviewed all documentation  for this visit. The documentation on 07/22/22 for the exam, diagnosis, procedures, and orders are all accurate and complete.   IF YOU HAVE BEEN REFERRED TO A SPECIALIST, IT MAY TAKE 1-2 WEEKS TO SCHEDULE/PROCESS THE REFERRAL. IF YOU HAVE NOT HEARD FROM US/SPECIALIST IN TWO WEEKS, PLEASE GIVE [place] A CALL AT [phone].   THE PATIENT IS ENCOURAGED TO PRACTICE SOCIAL DISTANCING DUE TO THE COVID-19 PANDEMIC.

## 2022-07-25 MED ORDER — FENTANYL CITRATE (PF) 100 MCG/2ML IJ SOLN
INTRAMUSCULAR | Status: AC
  Filled 2022-07-25: qty 2

## 2022-07-25 MED ORDER — MIDAZOLAM HCL 2 MG/2ML IJ SOLN
INTRAMUSCULAR | Status: AC
  Filled 2022-07-25: qty 2

## 2022-07-26 ENCOUNTER — Encounter: Payer: BC Managed Care – PPO | Admitting: Physical Medicine and Rehabilitation

## 2022-07-27 ENCOUNTER — Encounter: Payer: Self-pay | Admitting: Hematology

## 2022-07-27 DIAGNOSIS — Z85038 Personal history of other malignant neoplasm of large intestine: Secondary | ICD-10-CM | POA: Diagnosis not present

## 2022-07-27 DIAGNOSIS — Z933 Colostomy status: Secondary | ICD-10-CM | POA: Diagnosis not present

## 2022-07-27 DIAGNOSIS — S31109A Unspecified open wound of abdominal wall, unspecified quadrant without penetration into peritoneal cavity, initial encounter: Secondary | ICD-10-CM | POA: Diagnosis not present

## 2022-07-27 DIAGNOSIS — Z4801 Encounter for change or removal of surgical wound dressing: Secondary | ICD-10-CM | POA: Diagnosis not present

## 2022-08-04 ENCOUNTER — Ambulatory Visit (HOSPITAL_COMMUNITY)
Admission: RE | Admit: 2022-08-04 | Discharge: 2022-08-04 | Disposition: A | Discharge status: Home / Self Care | Payer: BC Managed Care – PPO | Source: Ambulatory Visit | Attending: Hematology | Admitting: Hematology

## 2022-08-04 ENCOUNTER — Encounter (HOSPITAL_COMMUNITY): Payer: Self-pay

## 2022-08-04 DIAGNOSIS — C187 Malignant neoplasm of sigmoid colon: Secondary | ICD-10-CM

## 2022-08-04 DIAGNOSIS — K8020 Calculus of gallbladder without cholecystitis without obstruction: Secondary | ICD-10-CM | POA: Diagnosis not present

## 2022-08-04 DIAGNOSIS — C189 Malignant neoplasm of colon, unspecified: Secondary | ICD-10-CM | POA: Diagnosis not present

## 2022-08-04 DIAGNOSIS — C786 Secondary malignant neoplasm of retroperitoneum and peritoneum: Secondary | ICD-10-CM | POA: Diagnosis not present

## 2022-08-04 DIAGNOSIS — R911 Solitary pulmonary nodule: Secondary | ICD-10-CM | POA: Diagnosis not present

## 2022-08-06 ENCOUNTER — Encounter: Payer: Self-pay | Admitting: Hematology

## 2022-08-09 NOTE — Assessment & Plan Note (Signed)
-  FO showed MSI-High disease, and multiple mutations including APC, [name]6, and BRCA2 mutations.  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  -we previously discussed testing for her children when they are of age. 

## 2022-08-09 NOTE — Assessment & Plan Note (Signed)
Continue medication.

## 2022-08-09 NOTE — Assessment & Plan Note (Addendum)
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 She has been tolerating well overall but has developed joint pain which could be related, overall manageable. She is clinically doing better also with more energy and less abdominal pain   -her CEA has normalized on immunotherapy. -CT CAP on 03/15/22 again showed mixed response -she had PET scan on 04/26/2022 for evaluation of her ovarian cysts, and saw GYN Dr. Ernestina Patches, we decided to monitor it. -She tolerating Keytruda well, with mild joint pain.  She recently started workout at the gym, which has helped her joint pain. -restaging CT from 08/04/2022 showed stable disease in left abdomen and pelvis, no new lesions. Will continue [facility]

## 2022-08-10 NOTE — Progress Notes (Unsigned)
[place]   [phone]) [phone] Fax:([phone]) [phone]   Clinic Follow up Note   Patient Care Team: [name], MD as PCP - General (Internal Medicine) [name], MD as PCP - Cardiology (Cardiology) [name], [name] as Physician Assistant (Emergency Medicine) [name], MD as Consulting Physician (Oncology) [name], MD as Consulting Physician (General Surgery) [name], [name], MD as Consulting Physician (Neurology) [name], [name], MD as Consulting Physician (Obstetrics and Gynecology) [name], MD as Consulting Physician (Gastroenterology) [name], [name], NP as Nurse Practitioner (Nurse Practitioner)  Date of Service:  08/11/2022  CHIEF COMPLAINT: f/u of colon cancer    CURRENT THERAPY:  -Keytruda, q21 days, starting 09/08/21 -Iv Venofer as needed if ferritin <100     ASSESSMENT:  Mikayla Rios is a 37 y.o. 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 She has been tolerating well overall but has developed joint pain which could be related, overall manageable. She is clinically doing better also with more energy and less abdominal pain   -her CEA has normalized on immunotherapy. -CT CAP on 03/15/22 again showed mixed response -she had PET scan on 04/26/2022 for evaluation of her ovarian cysts, and saw GYN Dr. Ernestina Patches, we decided to monitor it. -She tolerating Keytruda well, with mild joint pain.  She recently started workout at the gym, which has helped her joint pain. -restaging CT from 08/04/2022 showed stable disease in left abdomen and pelvis, no new lesions.  I  personally reviewed her scan images, and discussed findings with patient.   -Will continue Keytruda   FAP (familial adenomatous polyposis) -FO showed MSI-High disease, and multiple mutations including APC, [name]6, and BRCA2 mutations.  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  -we previously discussed testing for her children when they are of age.    Essential hypertension Continue medication      PLAN: -Discuss CT scan, SD -proceed  with C10 Keytruda -repeat scan 3 months. -lab/flush,f/u and Keytruda 08/31/2022  SUMMARY OF ONCOLOGIC HISTORY: Oncology History Overview Note   Cancer Staging  Malignant neoplasm of sigmoid colon [facility]) Staging form: Colon and Rectum, AJCC 8th Edition - Pathologic stage from 04/15/2021: Stage IIC (pT4b, pN0, cM0) - Signed by [name], MD on 06/12/2021    Malignant neoplasm of sigmoid colon [facility])   Initial Diagnosis   Malignant neoplasm of sigmoid colon ([place])   11/04/2020 Imaging   EXAM: CT ABDOMEN AND PELVIS WITHOUT CONTRAST  IMPRESSION: 1. Perforating descending colonic diverticulitis with multiple abdominopelvic gas and fluid collections, measuring up to 5.7 cm and further described above. 2. Cholelithiasis without findings of acute cholecystitis. 3. 3.6 cm benign left adrenal adenoma. 4. Leiomyomatous uterus. 5. 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   11/07/2020 Imaging   EXAM: CT ABDOMEN AND PELVIS WITHOUT CONTRAST  IMPRESSION: Continued wall thickening is seen involving descending colon suggesting infectious or inflammatory colitis or perforated diverticulitis. There is an adjacent fluid collection measuring 7.0 x 5.0 cm consistent with abscess which is significantly enlarged compared to prior exam. Adjacent to the abscess, there  is a severely thickened small bowel loop most consistent with  secondary inflammation.   5.2 x 3.5 cm fluid collection is noted within the left psoas muscle consistent with abscess which is significantly enlarged compared to prior exam. 4.4 x 3.8 cm fluid collection consistent with abscess is noted in the left retroperitoneal region which is also enlarged compared to prior exam.   Fibroid uterus.   Cholelithiasis.   11/27/2020 Imaging   EXAM: CT ABDOMEN AND PELVIS WITHOUT CONTRAST  IMPRESSION: 1. Significantly interval decreased size of the previously visualized left retroperitoneal and left anterior mid abdominal fluid collections. There is persistent fat stranding and mural thickening of the mid descending colon at this level. 2. Unchanged leiomyomatous uterus. 3. Unchanged cholelithiasis.   12/11/2020 Imaging   EXAM: CT ABDOMEN AND PELVIS WITHOUT CONTRAST  IMPRESSION: 1. Stable inflammatory changes centered around the distal descending colon in the left lower abdomen. Pericolonic inflammatory changes have minimally changed since 11/27/2020. Small pocket of gas medial to the colon is probably associated with a fistula or small residual abscess collection. 2. Stable position of the two percutaneous drains. The more anterior drain may be extending through a portion of the small bowel. 3. Cholelithiasis. 4. Fibroid uterus. Cannot exclude an ovarian/adnexal cystic structure near the uterine fundus. 5. Left adrenal adenoma.   01/07/2021 Imaging   EXAM: CT ABDOMEN AND PELVIS WITH CONTRAST  IMPRESSION: 1. No new abscesses identified. Similar degree of soft tissue thickening seen in the left pericolonic region. The degree of persistent soft tissues thickening in the pericolonic space further raises suspicions for malignancy. Further evaluation with colonoscopy should be performed. 2. Left retroperitoneal abscess drain unchanged in position. Anterior left abdominal drain again seen terminating within small bowel loop. 3. 2.6 cm mildly  sclerotic lesion noted in the L2 vertebral body. Further evaluation with contrast enhanced lumbar spine MRI should be performed.   02/04/2021 Imaging   EXAM: CT ABDOMEN AND PELVIS WITH CONTRAST  IMPRESSION: No new abdominopelvic collections or abscess development in the 1 month interval.   Left anterior drain remains within a loop of small bowel, unchanged.   Left lateral abscess drain remains in the retroperitoneal space adjacent to the iliopsoas muscle with a small amount of residual fluid and air but no measurable collection.   Stable soft tissue prominence and pericolonic strandy edema/inflammation about the left descending colon compatible with residual diverticulitis/colitis. Difficult to exclude underlying transmural lesion.   04/01/2021 Imaging   EXAM: CT ABDOMEN AND PELVIS WITH CONTRAST  IMPRESSION: There appears to be significant enhancement and wall thickening involving the descending colon with some degree of traction and involvement of adjacent small bowel loops. This is consistent with the history of diverticulitis and perforation. Stable position of percutaneous drainage catheter is seen adjacent to left psoas muscle with no significant residual fluid remaining. The other percutaneous drainage catheter that was previously noted to be within small bowel loop on prior exam in the left lower quadrant, has significantly retracted and appears to be outside of the peritoneal space at this time.   Since the prior exam, there does appear to be some degree of rotation involving mesenteric vessels and structures in the right lower quadrant, suggesting partial volvulus or malrotation. There is the interval development of several lymph nodes in this area, most likely inflammatory or reactive in etiology. Mild amount of free fluid is also noted in the pelvis. However, no significant bowel wall thickening or dilatation is seen in this area. These results will be  called to the  ordering clinician or representative by the Radiologist Assistant, and communication documented in the PACS or zVision Dashboard.   Stable uterine fibroid.   Hepatic steatosis.   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   Cholelithiasis.   04/10/2021 Imaging   EXAM: CT ANGIOGRAPHY CHEST CT ABDOMEN AND PELVIS WITH CONTRAST  IMPRESSION: 1. Again seen are findings compatible with descending colon diverticulitis with perforation. Free air and inflammation have increased in the interval. 2. New lobulated enhancing fluid collection posterior to this segment of inflamed colon measuring 8.5 x 3.5 x 10.0 cm. This collection now invades the adjacent iliopsoas muscle as well as extends through the left lateral abdominal wall. The tip of the drainage catheter is in this collection. Findings are compatible with abscess. 3. Anterior left percutaneous drainage catheter tip has been pulled back and is now within the subcutaneous tissues. 4. Trace free fluid. 5. No pulmonary embolism.  No acute cardiopulmonary process. 6. Cholelithiasis. 7. Fatty infiltration of the liver.   04/15/2021 Definitive Surgery   FINAL MICROSCOPIC DIAGNOSIS:   A. SMALL BOWEL, RESECTION:  - Adenocarcinoma.  - No carcinoma identified in 1 lymph node.   B. PERFORATED LEFT COLON, RESECTION:  - Moderately differentiated colonic adenocarcinoma.  - Tumor extends into pericolonic adipose tissue, and is strongly  suggestive of invasion into small bowel.  See oncology table/comments.  - No carcinoma identified in 4 lymph nodes (0/4).  - Tubular adenoma with high-grade dysplasia, 1.  - Tubular adenomas with low grade dysplasia, 3.   Comments: The size of the tumor is difficult to estimate secondary to the disrupted nature of the specimen, as well as the infiltrative nature of the tumor.  Tumor can be identified as definitely invading into the pericolonic adipose tissue (block B4), but given the extreme disruption of the tissue,  and the involvement of the small bowel by what appears to be a colonic adenocarcinoma, I favor perforation of the large bowel with direct invasion into the small bowel.  Accordingly, I believe this is best regarded as a pT4b lesion.   ADDENDUM:  Mismatch Repair Protein (IHC)  SUMMARY INTERPRETATION: ABNORMAL  There is loss of the major MMR protein [name]6: This indicates a high probability that a hereditary germline mutation is present and referral to genetic counseling is warranted. It is recommended that the loss of protein expression be correlated with molecular based microsatellite instability testing.   IHC EXPRESSION RESULTS  TEST           RESULT  MLH1:          Preserved nuclear expression  [name]2:          Preserved nuclear expression  [name]6:          LOSS OF NUCLEAR EXPRESSION  PMS2:          Preserved nuclear expression    04/15/2021 Cancer Staging   Staging form: Colon and Rectum, AJCC 8th Edition - Pathologic stage from 04/15/2021: Stage IIC (pT4b, pN0, cM0) - Signed by [name], MD on 06/12/2021 Stage prefix: Initial diagnosis Total positive nodes: 0 Histologic grading system: 4 grade system Histologic grade (G): G2 Residual tumor (R): R0 - None   04/29/2021 Imaging   EXAM: CT ABDOMEN AND PELVIS WITH CONTRAST  IMPRESSION: Continued left retroperitoneal abscesses inferior to the left kidney, involving the left psoas muscle and left abdominal wall musculature. Overall size is decreased since prior study. Interval removal of left lower quadrant abscess drainage catheter.   New  fluid collection in the cul-de-sac and wrapping around the uterus concerning for abscess.   Cholelithiasis.   Small left pleural effusion.  Bibasilar atelectasis.   Stable left adrenal adenoma.  ADDENDUM: After discussing the case with Dr. Dwaine Gale in interventional radiology, it was noted that the fluid in the pelvis originally thought to be in the cul-de-sac is likely within the vagina, best seen on  sagittal imaging. Recommend speculum exam.   Also, in the left lateral wall in the area of prior abscess in the lateral wall abdominal musculature, some of the abdomen soft tissue appears to be enhancing. While this could be infectious, cannot exclude tumor seeding in the left lateral abdominal wall, measuring 6.9 x 3.8 cm on image 64 of series 2.   06/16/2021 Imaging   EXAM: CT ABDOMEN AND PELVIS WITH CONTRAST  IMPRESSION: Increased size of bulky soft tissue masses involving the left psoas muscle and left lateral abdominal wall soft tissues, consistent with progressive metastatic disease.   Significant progression of multiple peritoneal masses throughout the abdomen and pelvis, consistent with peritoneal carcinomatosis.   New mild retroperitoneal lymphadenopathy, consistent with metastatic disease.   Stable uterine fibroid and left adrenal adenoma.   Cholelithiasis, without evidence of cholecystitis.   06/22/2021 - 08/19/2021 Chemotherapy   Patient is on Treatment Plan : COLORECTAL FOLFOX q14d x 6 months     06/22/2021 Tumor Marker   Patient's tumor was tested for the following markers: CEA. Results of the tumor marker test revealed 87.41.   07/10/2021 Pathology Results   FINAL MICROSCOPIC DIAGNOSIS:   A. PERITONEAL MASS, LEFT UPPER ABDOMINAL QUADRANT, BIOPSY:  -  Metastatic adenocarcinoma with necrosis, histologically consistent with colon primary.     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 The report date is 07/16/2021. The remainder of testing was negative/normal.  The CancerNext-Expanded + RNAinsight gene panel offered by [employer] and includes sequencing and rearrangement analysis for the following 77 genes: IP, ALK, APC*, ATM*, AXIN2, BAP1, BARD1, BLM, BMPR1A, BRCA1*, BRCA2*, BRIP1*, CDC73, CDH1*,CDK4, CDKN1B, CDKN2A, CHEK2*, CTNNA1, DICER1, FANCC, FH, FLCN, GALNT12, KIF1B, LZTR1, MAX, MEN1, MET,  MLH1*, [name]2*, [name]3, [name]6*, MUTYH*, NBN, NF1*, NF2, NTHL1, PALB2*, PHOX2B, PMS2*, POT1, PRKAR1A, PTCH1, PTEN*, RAD51C*, RAD51D*,RB1, RECQL, RET, SDHA, SDHAF2, SDHB, SDHC, SDHD, SMAD4, SMARCA4, SMARCB1, SMARCE1, STK11, SUFU, TMEM127, TP53*,TSC1, TSC2, VHL and XRCC2 (sequencing and deletion/duplication); EGFR, EGLN1, HOXB13, KIT, MITF, PDGFRA, POLD1 and POLE (sequencing only); EPCAM and GREM1 (deletion/duplication only).   08/28/2021 Imaging   EXAM: CT CHEST, ABDOMEN, AND PELVIS WITH CONTRAST  IMPRESSION: 1. Continued interval progression of multiple metastatic soft tissue nodules/masses in the abdomen and pelvis. 2. No evidence for metastatic disease in the chest. 3. Similar appearance of the subtle lesion in the L2 vertebral body, suspicious for metastatic involvement. 4. Cholelithiasis.   09/03/2021 -  Chemotherapy   Patient is on Treatment Plan : COLORECTAL Pembrolizumab (200) q21d     09/08/2021 - 02/01/2022 Chemotherapy   Patient is on Treatment Plan : COLORECTAL Pembrolizumab (200) q21d     01/08/2022 Imaging   CT CAP IMPRESSION: 1. Mixed response to therapy. Interval decrease in size of the conglomerate multilobulated mass extending from the left psoas into the left posterior pararenal space through the left lateral abdominal wall along the tract of the previously placed percutaneous drainage catheter. Interval decrease in size of right adnexal mass and omental masses within the right lower quadrant of the abdomen. Interval development of peritoneal carcinomatosis with  omental caking within the left lower quadrant of the abdomen and left subdiaphragmatic region adjacent to the spleen and along the left pericolic gutter as well as development of ascites appearing loculated within the anterior peritoneum. 2. Interval development of mild left hydronephrosis secondary to stricturing of the mid left ureter. 3. Stable left adrenal metastasis. 4. Stable sclerotic lesion within the L2 vertebral body.  No new lytic or blastic bone lesions are seen. 5. Cholelithiasis. 6. No evidence of intrathoracic metastatic disease. 7. 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 Moderate colonic stool burden. No evidence of obstruction.   03/15/2022 Imaging   IMPRESSION: 1. Status post sigmoid colon resection with left lower quadrant end colostomy. 2. Multicystic bilateral ovarian lesions are slightly increased in size. 3. Diminished, small volume of loculated appearing ascites throughout the abdomen and pelvis, with similar appearance of peritoneal thickening and nodularity throughout. 4. Heterogeneously calcified, conglomerate masses in the left retroperitoneum and abdominal wall are slightly diminished in size. 5. Left adrenal lesion not significantly changed. 6. Unchanged faintly sclerotic metastatic lesion of L2. 7. Constellation of findings is consistent with mixed response to treatment however with overall little interval change. 8. Minimal residual left hydronephrosis and proximal hydroureter, improved compared to prior examination. 9. Cholelithiasis. 10. Coronary artery disease, significantly advanced for patient age.   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 Pt  state she still have some joint pain but its better. Pt reports she is still going to the gym. Pt state that her vertigo has come back. Pt state that it comes when she moves to fast she state it start last week twice.       All other systems were reviewed with the patient and are negative.  MEDICAL HISTORY:  Past Medical  History:  Diagnosis Date   Allergy    seasonal   Anemia    Blood infection 1985   Blood transfusion without reported diagnosis    colon ca 04/2021   Diverticulitis    Family history of breast cancer    Family history of colon cancer    Family history of colon cancer    Family history of stomach cancer    Hypertension    Obesity    Sleep apnea     SURGICAL HISTORY: Past Surgical History:  Procedure Laterality Date   COLECTOMY WITH COLOSTOMY CREATION/HARTMANN PROCEDURE N/A 04/15/2021   Procedure: COLOSTOMY CREATION/HARTMANN PROCEDURE;  Surgeon: [name], MD;  Location: [facility];  Service: General;  Laterality: N/A;   IR CATHETER TUBE CHANGE  12/12/2020   IR CATHETER TUBE CHANGE  01/27/2021   IR CATHETER TUBE CHANGE  02/19/2021   IR CATHETER TUBE CHANGE  04/13/2021   IR IMAGING GUIDED PORT INSERTION  10/02/2021   IR RADIOLOGIST EVAL & MGMT  11/27/2020   IR RADIOLOGIST EVAL & MGMT  12/11/2020   IR RADIOLOGIST EVAL & MGMT  01/07/2021   IR RADIOLOGIST EVAL & MGMT  02/04/2021   IR SINUS/FIST TUBE CHK-NON GI  12/12/2020   IR SINUS/FIST TUBE CHK-NON GI  02/19/2021  LAPAROSCOPIC PARTIAL COLECTOMY N/A 04/15/2021   Procedure: LAPAROSCOPIC ASSISTED HARTMANN RESECTION;  Surgeon: [name], MD;  Location: [facility];  Service: General;  Laterality: N/A;    I have reviewed the social history and family history with the patient and they are unchanged from previous note.  ALLERGIES:  is allergic to chlorhexidine gluconate, shellfish allergy, and penicillins.  MEDICATIONS:  Current Outpatient Medications  Medication Sig Dispense Refill   ALPRAZolam (XANAX) 0.25 MG tablet Take 1 tablet (0.25 mg total) by mouth at bedtime as needed for anxiety. 30 tablet 0   amLODipine (NORVASC) 10 MG tablet TAKE 1 TABLET BY MOUTH EVERY DAY 90 tablet 3   DULoxetine (CYMBALTA) 60 MG capsule TAKE 1 CAPSULE BY MOUTH EVERY DAY 90 capsule 2   lidocaine-prilocaine (EMLA) cream Apply 1 Application topically as  needed. 30 g 3   metoprolol tartrate 75 MG TABS Take 75 mg by mouth 2 (two) times daily. 90 tablet 3   oxyCODONE-acetaminophen (PERCOCET) 7.5-325 MG tablet Take 1 tablet by mouth every 8 (eight) hours as needed for severe pain. 30 tablet 0   POTASSIUM PO Take by mouth.     No current facility-administered medications for this visit.   Facility-Administered Medications Ordered in Other Visits  Medication Dose Route Frequency Provider Last Rate Last Admin   fentaNYL (SUBLIMAZE) injection   Intravenous PRN Mir, Paula Libra, MD   50 mcg at 10/02/21 1436   midazolam (VERSED) injection   Intravenous PRN Mir, Paula Libra, MD   1 mg at 10/02/21 1436    PHYSICAL EXAMINATION: ECOG PERFORMANCE STATUS: 1 - Symptomatic but completely ambulatory  Vitals:   08/11/22 0958  BP: (!) 122/90  Pulse: 78  Resp: 18  Temp: 97.8 F (36.6 C)  SpO2: 100%   Wt Readings from Last 3 Encounters:  08/11/22 245 lb 9.6 oz (111.4 kg)  07/22/22 238 lb 6.4 oz (108.1 kg)  07/19/22 243 lb 14.4 oz (110.6 kg)     GENERAL:alert, no distress and comfortable SKIN: skin color normal, no rashes or significant lesions EYES: normal, Conjunctiva are pink and non-injected, sclera clear  NEURO: alert & oriented x 3 with fluent speech   LABORATORY DATA:  I have reviewed the data as listed    Latest Ref Rng & Units 08/11/2022    9:38 AM 07/19/2022    8:08 AM 06/29/2022   11:25 AM  CBC  WBC 4.0 - 10.5 K/uL 4.5  6.2  6.7   Hemoglobin 12.0 - 15.0 g/dL 11.4  10.7  11.3   Hematocrit 36.0 - 46.0 % 33.7  33.2  34.4   Platelets 150 - 400 K/uL 365  340  434         Latest Ref Rng & Units 08/11/2022    9:38 AM 07/19/2022    8:08 AM 06/29/2022   11:25 AM  CMP  Glucose 70 - 99 mg/dL 95  95  92   BUN 6 - 20 mg/dL '12  18  21   '$ Creatinine 0.44 - 1.00 mg/dL 0.66  0.67  0.84   Sodium 135 - 145 mmol/L 138  137  140   Potassium 3.5 - 5.1 mmol/L 3.9  4.0  3.9   Chloride 98 - 111 mmol/L 109  109  110   CO2 22 - 32 mmol/L '23  22  24    '$ Calcium 8.9 - 10.3 mg/dL 8.7  8.7  8.9   Total Protein 6.5 - 8.1 g/dL 7.3  6.9  7.0  Total Bilirubin 0.3 - 1.2 mg/dL 0.4  0.3  0.2   Alkaline Phos 38 - 126 U/L 59  64  77   AST 15 - 41 U/L '15  14  13   '$ ALT 0 - 44 U/L '11  12  11       '$ RADIOGRAPHIC STUDIES: I have personally reviewed the radiological images as listed and agreed with the findings in the report. No results found.    Orders Placed This Encounter  Procedures   CBC with Differential ([place] Only)    Standing Status:   Future    Standing Expiration Date:   09/22/2023   CMP ([place] only)    Standing Status:   Future    Standing Expiration Date:   09/22/2023   T4    Standing Status:   Future    Standing Expiration Date:   09/22/2023   TSH    Standing Status:   Future    Standing Expiration Date:   09/22/2023   CBC with Differential ([facility] Only)    Standing Status:   Future    Standing Expiration Date:   10/13/2023   CMP ([place] only)    Standing Status:   Future    Standing Expiration Date:   10/13/2023   CBC with Differential ([place] Only)    Standing Status:   Future    Standing Expiration Date:   11/03/2023   CMP ([place] only)    Standing Status:   Future    Standing Expiration Date:   11/03/2023   CBC with Differential ([facility] Only)    Standing Status:   Future    Standing Expiration Date:   11/24/2023   CMP ([place] only)    Standing Status:   Future    Standing Expiration Date:   11/24/2023   T4    Standing Status:   Future    Standing Expiration Date:   11/24/2023   TSH    Standing Status:   Future    Standing Expiration Date:   11/24/2023   CBC with Differential ([place] Only)    Standing Status:   Future    Standing Expiration Date:   12/15/2023   CMP ([place] only)    Standing Status:   Future    Standing Expiration Date:   12/15/2023   CBC with Differential ([place] Only)    Standing Status:   Future    Standing Expiration Date:    01/05/2024   CMP ([place] only)    Standing Status:   Future    Standing Expiration Date:   01/05/2024   All questions were answered. The patient knows to call the clinic with any problems, questions or concerns. No barriers to learning was detected. The total time spent in the appointment was 30 minutes.     [name], MD 08/11/2022   Felicity Coyer, CMA, am acting as scribe for [name], MD.   I have reviewed the above documentation for accuracy and completeness, and I agree with the above.

## 2022-08-11 ENCOUNTER — Inpatient Hospital Stay: Payer: BC Managed Care – PPO | Attending: Hematology | Admitting: Hematology

## 2022-08-11 ENCOUNTER — Inpatient Hospital Stay: Payer: BC Managed Care – PPO

## 2022-08-11 ENCOUNTER — Encounter: Payer: Self-pay | Admitting: Nurse Practitioner

## 2022-08-11 ENCOUNTER — Inpatient Hospital Stay (HOSPITAL_BASED_OUTPATIENT_CLINIC_OR_DEPARTMENT_OTHER): Payer: BC Managed Care – PPO | Admitting: Nurse Practitioner

## 2022-08-11 ENCOUNTER — Encounter: Payer: Self-pay | Admitting: Hematology

## 2022-08-11 VITALS — BP 122/90 | HR 78 | Temp 97.8°F | Resp 18 | Ht 63.0 in | Wt 245.6 lb

## 2022-08-11 DIAGNOSIS — D3502 Benign neoplasm of left adrenal gland: Secondary | ICD-10-CM | POA: Diagnosis not present

## 2022-08-11 DIAGNOSIS — Z1509 Genetic susceptibility to other malignant neoplasm: Secondary | ICD-10-CM | POA: Insufficient documentation

## 2022-08-11 DIAGNOSIS — C786 Secondary malignant neoplasm of retroperitoneum and peritoneum: Secondary | ICD-10-CM

## 2022-08-11 DIAGNOSIS — J90 Pleural effusion, not elsewhere classified: Secondary | ICD-10-CM | POA: Insufficient documentation

## 2022-08-11 DIAGNOSIS — K760 Fatty (change of) liver, not elsewhere classified: Secondary | ICD-10-CM | POA: Insufficient documentation

## 2022-08-11 DIAGNOSIS — R530 Neoplastic (malignant) related fatigue: Secondary | ICD-10-CM

## 2022-08-11 DIAGNOSIS — I10 Essential (primary) hypertension: Secondary | ICD-10-CM | POA: Diagnosis not present

## 2022-08-11 DIAGNOSIS — D649 Anemia, unspecified: Secondary | ICD-10-CM

## 2022-08-11 DIAGNOSIS — D1391 Familial adenomatous polyposis: Secondary | ICD-10-CM | POA: Diagnosis not present

## 2022-08-11 DIAGNOSIS — C7951 Secondary malignant neoplasm of bone: Secondary | ICD-10-CM | POA: Diagnosis not present

## 2022-08-11 DIAGNOSIS — R609 Edema, unspecified: Secondary | ICD-10-CM | POA: Diagnosis not present

## 2022-08-11 DIAGNOSIS — C187 Malignant neoplasm of sigmoid colon: Secondary | ICD-10-CM | POA: Diagnosis not present

## 2022-08-11 DIAGNOSIS — K8020 Calculus of gallbladder without cholecystitis without obstruction: Secondary | ICD-10-CM | POA: Diagnosis not present

## 2022-08-11 DIAGNOSIS — Z79899 Other long term (current) drug therapy: Secondary | ICD-10-CM | POA: Diagnosis not present

## 2022-08-11 DIAGNOSIS — Z452 Encounter for adjustment and management of vascular access device: Secondary | ICD-10-CM

## 2022-08-11 DIAGNOSIS — Z515 Encounter for palliative care: Secondary | ICD-10-CM | POA: Diagnosis not present

## 2022-08-11 DIAGNOSIS — Z5112 Encounter for antineoplastic immunotherapy: Secondary | ICD-10-CM | POA: Diagnosis not present

## 2022-08-11 LAB — CMP (CANCER CENTER ONLY)
ALT: 11 U/L (ref 0–44)
AST: 15 U/L (ref 15–41)
Albumin: 3.9 g/dL (ref 3.5–5.0)
Alkaline Phosphatase: 59 U/L (ref 38–126)
Anion gap: 6 (ref 5–15)
BUN: 12 mg/dL (ref 6–20)
CO2: 23 mmol/L (ref 22–32)
Calcium: 8.7 mg/dL — ABNORMAL LOW (ref 8.9–10.3)
Chloride: 109 mmol/L (ref 98–111)
Creatinine: 0.66 mg/dL (ref 0.44–1.00)
GFR, Estimated: >60 mL/min (ref >60)
Glucose, Bld: 95 mg/dL (ref 70–99)
Potassium: 3.9 mmol/L (ref 3.5–5.1)
Sodium: 138 mmol/L (ref 135–145)
Total Bilirubin: 0.4 mg/dL (ref 0.3–1.2)
Total Protein: 7.3 g/dL (ref 6.5–8.1)

## 2022-08-11 LAB — CBC WITH DIFFERENTIAL (CANCER CENTER ONLY)
Abs Immature Granulocytes: 0.03 10*3/uL (ref 0.00–0.07)
Basophils Absolute: 0 10*3/uL (ref 0.0–0.1)
Basophils Relative: 0 %
Eosinophils Absolute: 0.1 10*3/uL (ref 0.0–0.5)
Eosinophils Relative: 2 %
HCT: 33.7 % — ABNORMAL LOW (ref 36.0–46.0)
Hemoglobin: 11.4 g/dL — ABNORMAL LOW (ref 12.0–15.0)
Immature Granulocytes: 1 %
Lymphocytes Relative: 33 %
Lymphs Abs: 1.5 10*3/uL (ref 0.7–4.0)
MCH: 28.5 pg (ref 26.0–34.0)
MCHC: 33.8 g/dL (ref 30.0–36.0)
MCV: 84.3 fL (ref 80.0–100.0)
Monocytes Absolute: 0.3 10*3/uL (ref 0.1–1.0)
Monocytes Relative: 7 %
Neutro Abs: 2.6 10*3/uL (ref 1.7–7.7)
Neutrophils Relative %: 57 %
Platelet Count: 365 10*3/uL (ref 150–400)
RBC: 4 MIL/uL (ref 3.87–5.11)
RDW: 15.3 % (ref 11.5–15.5)
WBC Count: 4.5 10*3/uL (ref 4.0–10.5)
nRBC: 0 % (ref 0.0–0.2)

## 2022-08-11 LAB — IRON AND IRON BINDING CAPACITY (CC-WL,HP ONLY)
Iron: 77 ug/dL (ref 28–170)
Saturation Ratios: 25 % (ref 10.4–31.8)
TIBC: 314 ug/dL (ref 250–450)
UIBC: 237 ug/dL (ref 148–442)

## 2022-08-11 LAB — FERRITIN: Ferritin: 57 ng/mL (ref 11–307)

## 2022-08-11 LAB — CEA (IN HOUSE-CHCC): CEA (CHCC-In House): <1 ng/mL (ref 0.00–5.00)

## 2022-08-11 MED ORDER — SODIUM CHLORIDE 0.9% FLUSH
10.0000 mL | INTRAVENOUS | Status: DC | PRN
  Administered 2022-08-11: 10 mL

## 2022-08-11 MED ORDER — SODIUM CHLORIDE 0.9 % IV SOLN: Freq: Once | INTRAVENOUS | Status: AC

## 2022-08-11 MED ORDER — SODIUM CHLORIDE 0.9% FLUSH
10.0000 mL | Freq: Once | INTRAVENOUS | Status: AC
  Administered 2022-08-11: 10 mL

## 2022-08-11 MED ORDER — HEPARIN SOD (PORK) LOCK FLUSH 100 UNIT/ML IV SOLN
500.0000 [IU] | Freq: Once | INTRAVENOUS | Status: AC | PRN
  Administered 2022-08-11: 500 [IU]

## 2022-08-11 MED ORDER — SODIUM CHLORIDE 0.9 % IV SOLN
200.0000 mg | Freq: Once | INTRAVENOUS | Status: AC
  Administered 2022-08-11: 200 mg via INTRAVENOUS
  Filled 2022-08-11: qty 8

## 2022-08-11 NOTE — Progress Notes (Signed)
[facility]  [phone] [phone]   Name: Mikayla Rios Date: 08/11/2022 MRN: [id number]  DOB: 1985-12-19  Patient Care Team: [name], MD as PCP - General (Internal Medicine) [name], MD as PCP - Cardiology (Cardiology) [name], [name] as Physician Assistant (Emergency Medicine) [name], MD as Consulting Physician (Oncology) [name], MD as Consulting Physician (General Surgery) [name], [name], MD as Consulting Physician (Neurology) [name], [name], MD as Consulting Physician (Obstetrics and Gynecology) [name], MD as Consulting Physician (Gastroenterology) [name], [name], NP as Nurse Practitioner (Nurse Practitioner)    INTERVAL HISTORY: Mikayla Rios is a 37 y.o. 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 Recent CT scan on 06/16/21 showed progressive metastatic disease. Palliative ask to see for symptom management. Patient now receiving Keytruda and scan confirms that treatment effective in halting cancer progression.    SOCIAL HISTORY:     reports that she has never smoked. She has never used smokeless tobacco. She reports that she does not currently use alcohol. She reports that she does not use drugs.  ADVANCE DIRECTIVES: none   CODE STATUS: Full Code  PAST MEDICAL HISTORY: Past Medical History:  Diagnosis Date   Allergy    seasonal   Anemia    Blood infection 1985   Blood transfusion without reported diagnosis    colon ca 04/2021   Diverticulitis    Family history of breast cancer    Family history of colon cancer    Family history of colon cancer    Family history of stomach cancer    Hypertension    Obesity    Sleep apnea     ALLERGIES:  is allergic to chlorhexidine gluconate, shellfish allergy, and  penicillins.  MEDICATIONS:  Current Outpatient Medications  Medication Sig Dispense Refill   ALPRAZolam (XANAX) 0.25 MG tablet Take 1 tablet (0.25 mg total) by mouth at bedtime as needed for anxiety. 30 tablet 0   amLODipine (NORVASC) 10 MG tablet TAKE 1 TABLET BY MOUTH EVERY DAY 90 tablet 3   DULoxetine (CYMBALTA) 60 MG capsule TAKE 1 CAPSULE BY MOUTH EVERY DAY 90 capsule 2   lidocaine-prilocaine (EMLA) cream Apply 1 Application topically as needed. 30 g 3   metoprolol tartrate 75 MG TABS Take 75 mg by mouth 2 (two) times daily. 90 tablet 3   oxyCODONE-acetaminophen (PERCOCET) 7.5-325 MG tablet Take 1 tablet by mouth every 8 (eight) hours as needed for severe pain. 30 tablet 0   POTASSIUM PO Take by mouth.     No current facility-administered medications for this visit.   Facility-Administered Medications Ordered in Other Visits  Medication Dose Route Frequency Provider Last Rate Last Admin   fentaNYL (SUBLIMAZE) injection   Intravenous PRN Mir, Paula Libra, MD   50 mcg at 10/02/21 1436   midazolam (VERSED) injection   Intravenous PRN Mir, Paula Libra, MD   1 mg at 10/02/21 1436    VITAL SIGNS: LMP 07/21/2022 (Approximate)  There were no vitals filed for this visit.    Estimated body mass index is 43.51 kg/m as calculated from the following:   Height as of an earlier encounter on 08/11/22: '5\' 3"'$  (1.6 m).   Weight as of an earlier encounter on 08/11/22: 245 lb 9.6 oz (111.4 kg).   PERFORMANCE STATUS (ECOG) : 1 - Symptomatic but completely ambulatory   Physical Exam  General: NAD Cardiovascular:RRR Respiratory: normal breathing pattern  Abdomen: soft, tender, + bowel sounds, colostomy in place Neurological: AAO x4, mood appropriate  IMPRESSION: Mikayla Rios is seen in infusion area. She is alert, oriented, and engaging. Reports that things are going pretty well. Reports a good appetite and regular bowel movements.  Neoplams related pain Mikayla Rios reports that cancer pain is much  improved and she most often experiences Fibromyalgia pain. She manages this with Tylenol on a PRN basis. Does not take every day - just on occasion, on days when muscles are tense and pain is more severe. She has tried Cymbalta and stronger pain medications in the past but did not like the way that they made her feel. States that she still has trouble remembering things that happened when she was taking those medications before - and does not want to be groggy and have memory issues.    2. Anxiety/insomnia Mikayla Rios reports that she has trouble sleeping some nights related to trying to get comfortable in the bed. Is not taking Ambien.   Engages in regular exercise to help her muscles and encourage rest. Utilizes breathing techniques and meditation. Faith is also a source of strength. Mikayla Rios prefers to address symptoms naturally when at all possible. Commended patient on her exercise. Recommended additional non-pharmacological interventions such as trying a weighted blanket at night to see if this will help her relax.  Xanax ordered for patient in the past and she states that she does not recall ever picking it up from the pharmacy. Although she does not desire to take Xanax at this time, verbalizes understanding that it is available if/when she needs. Mikayla Rios is aware that she contact Palliative Care team for this or other medication needs.   3. Coping Asked about patient's children and she tells of their development/accomplishments. Two of her children are home-schooled virtually. Her son Mikayla Rios goes to school and is receiving extra supports for Autism. Mikayla Rios shares the joys and challenges of caring for a child with special needs. She expresses gratitude that she is now physically able to walk with him to the bus stop. Reports that in months past this was not possible - due to weakness and dyspnea.   Emotional support provided through active listening. Commended patient on her role in  caring for her children in the midst of her own illness.    PLAN: Tylenol as needed for muscular aches and pains.  Recommend that patient continue non-pharmacological interventions, such as exercise, that she finds helpful in managing her pain and anxiety/insomnia. Recommend patient try weighted blanket. Xanax 0.'25mg'$  as needed/as desired by patient at bedtime for sleep/anxiety. Miralax daily for constipation. Palliative team will plan to see her back in 6-8 weeks as needed, in collaboration with future appointments.   Patient expressed understanding and was in agreement with this plan. She also understands that She can call the clinic at any time with any questions, concerns, or complaints.    Signed by: Moss Mc, RN MSN [facility] / NP Student   Time Total: 20 min   Visit consisted of counseling and education dealing with the complex and emotionally intense issues of symptom management and palliative care in the setting of serious and potentially life-threatening illness.Greater than 50%  of this time was spent counseling and coordinating care related to the above assessment and plan.  Alda Lea, AGPCNP-BC  Palliative Medicine Team/[facility]

## 2022-08-11 NOTE — Patient Instructions (Signed)
[facility]  Discharge Instructions: Thank you for choosing [facility] to provide your oncology and hematology care.   If you have a lab appointment with the [facility], please go directly to the [facility] and check in at the registration area.   Wear comfortable clothing and clothing appropriate for easy access to any Portacath or PICC line.   We strive to give you quality time with your provider. You may need to reschedule your appointment if you arrive late (15 or more minutes).  Arriving late affects you and other patients whose appointments are after yours.  Also, if you miss three or more appointments without notifying the office, you may be dismissed from the clinic at the provider's discretion.      For prescription refill requests, have your pharmacy contact our office and allow 72 hours for refills to be completed.    Today you received the following chemotherapy and/or immunotherapy agents Keytruda      To help prevent nausea and vomiting after your treatment, we encourage you to take your nausea medication as directed.  BELOW ARE SYMPTOMS THAT SHOULD BE REPORTED IMMEDIATELY: *FEVER GREATER THAN 100.4 F (38 C) OR HIGHER *CHILLS OR SWEATING *NAUSEA AND VOMITING THAT IS NOT CONTROLLED WITH YOUR NAUSEA MEDICATION *UNUSUAL SHORTNESS OF BREATH *UNUSUAL BRUISING OR BLEEDING *URINARY PROBLEMS (pain or burning when urinating, or frequent urination) *BOWEL PROBLEMS (unusual diarrhea, constipation, pain near the anus) TENDERNESS IN MOUTH AND THROAT WITH OR WITHOUT PRESENCE OF ULCERS (sore throat, sores in mouth, or a toothache) UNUSUAL RASH, SWELLING OR PAIN  UNUSUAL VAGINAL DISCHARGE OR ITCHING   Items with * indicate a potential emergency and should be followed up as soon as possible or go to the Emergency Department if any problems should occur.  Please show the CHEMOTHERAPY ALERT CARD or IMMUNOTHERAPY ALERT CARD at  check-in to the Emergency Department and triage nurse.  Should you have questions after your visit or need to cancel or reschedule your appointment, please contact [facility]  Dept: [phone]  and follow the prompts.  Office hours are 8:00 a.m. to 4:30 p.m. Monday - Friday. Please note that voicemails left after 4:00 p.m. may not be returned until the following business day.  We are closed weekends and major holidays. You have access to a nurse at all times for urgent questions. Please call the main number to the clinic Dept: [phone] and follow the prompts.   For any non-urgent questions, you may also contact your provider using MyChart. We now offer e-Visits for anyone 63 and older to request care online for non-urgent symptoms. For details visit [email or web address].   Also download the MyChart app! Go to the app store, search "MyChart", open the app, select [facility], and log in with your MyChart username and password.

## 2022-08-26 DIAGNOSIS — Z933 Colostomy status: Secondary | ICD-10-CM | POA: Diagnosis not present

## 2022-08-26 DIAGNOSIS — S31109A Unspecified open wound of abdominal wall, unspecified quadrant without penetration into peritoneal cavity, initial encounter: Secondary | ICD-10-CM | POA: Diagnosis not present

## 2022-08-26 DIAGNOSIS — Z85038 Personal history of other malignant neoplasm of large intestine: Secondary | ICD-10-CM | POA: Diagnosis not present

## 2022-08-26 DIAGNOSIS — Z4801 Encounter for change or removal of surgical wound dressing: Secondary | ICD-10-CM | POA: Diagnosis not present

## 2022-08-28 IMAGING — CT CT CHEST-ABD-PELV W/ CM
2 of 5 series · 13 of 36 positions shown, 15 images · IV contrast (OMNIPAQUE 300)
Comparison: Chest x-ray from the previous day, CT from 08/28/2021

CLINICAL DATA: Possible sepsis, history of colon carcinoma

EXAM:
CT CHEST, ABDOMEN, AND PELVIS WITH CONTRAST
TECHNIQUE: Multidetector CT imaging of the chest, abdomen and pelvis was
performed following the standard protocol during bolus
administration of intravenous contrast.

[Series 2: cap with · axial · 0.77mm/px · z∈[-247,+278]mm · 10 of 127 slices shown, 12 images]
[im 11/127  mediastinal]
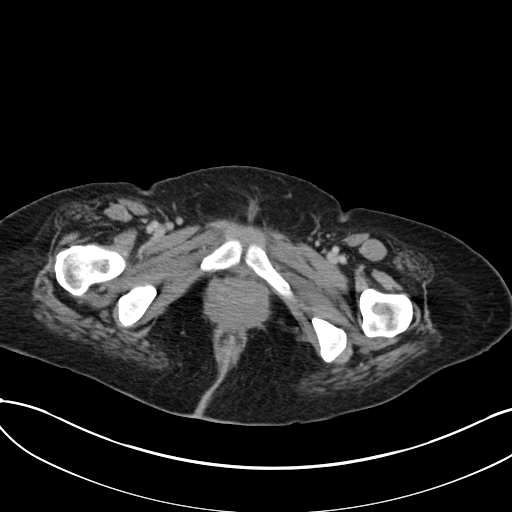
[im 11/127  bone]
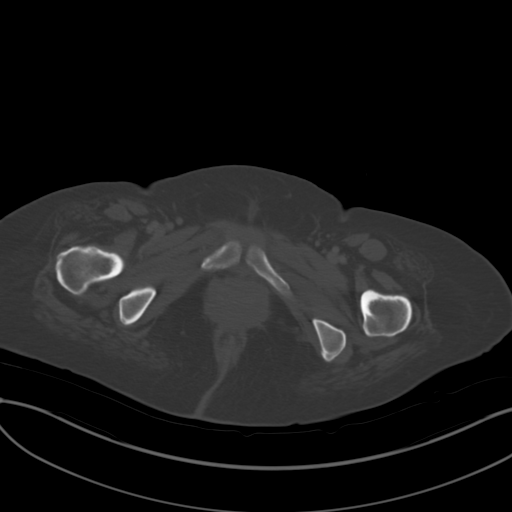
[im 22/127  mediastinal]
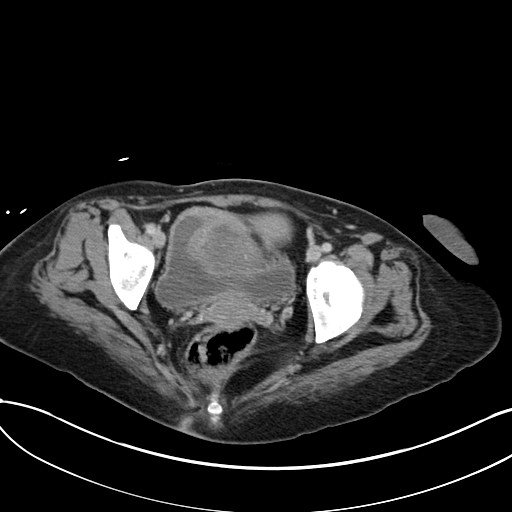
[im 32/127  mediastinal]
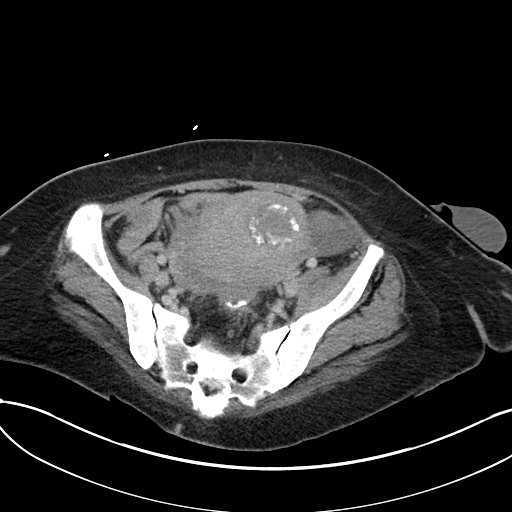
[im 43/127  mediastinal]
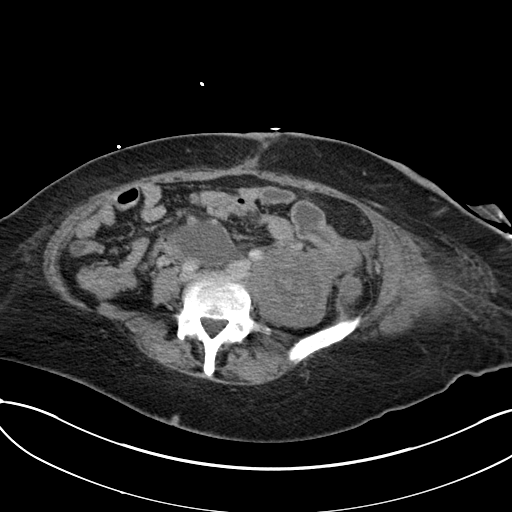
[im 53/127  mediastinal]
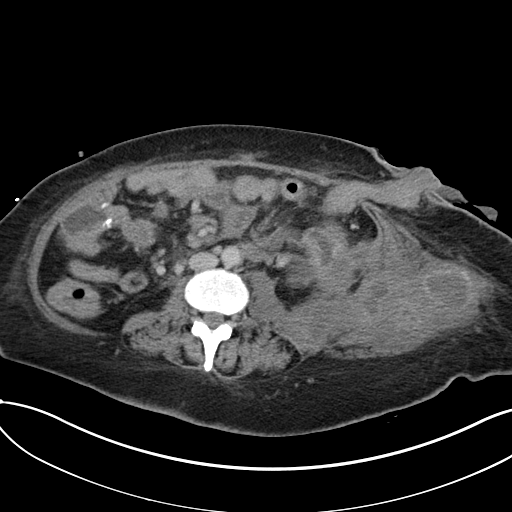
[im 74/127  mediastinal]
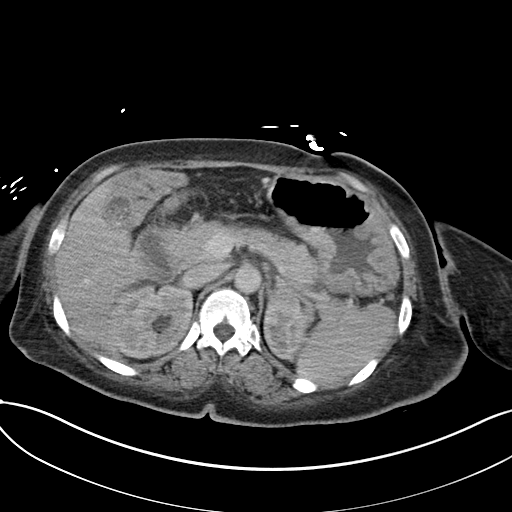
[im 85/127  mediastinal]
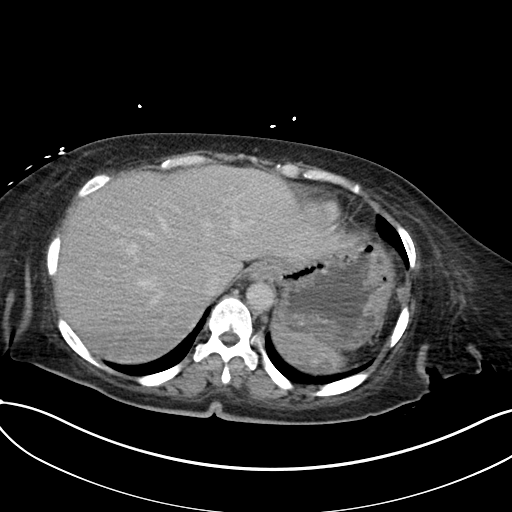
[im 95/127  mediastinal]
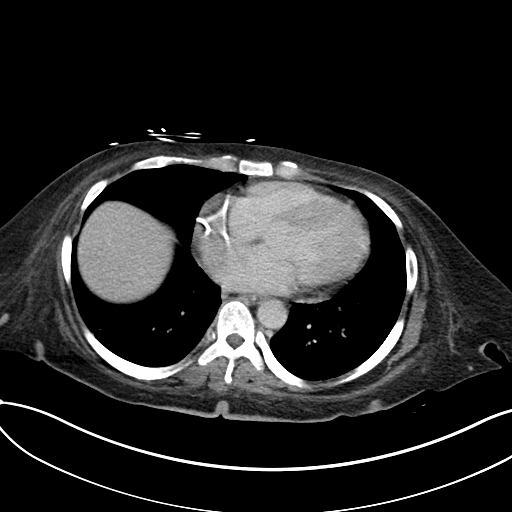
[im 106/127  mediastinal]
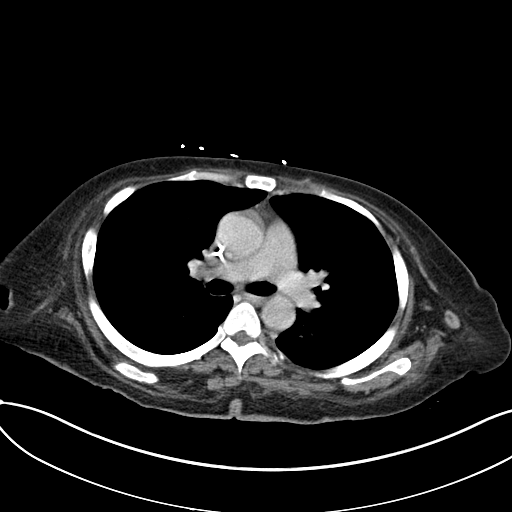
[im 106/127  bone]
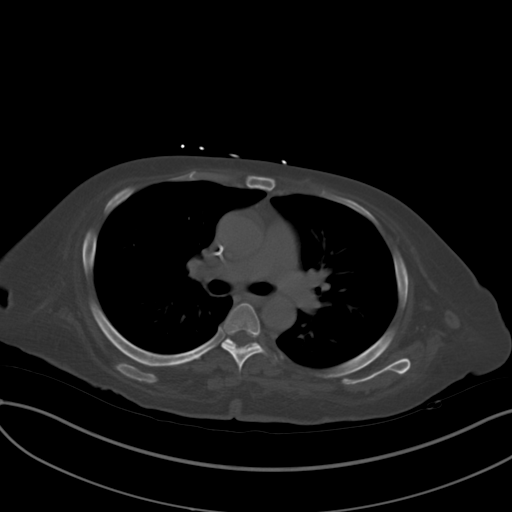
[im 116/127  mediastinal]
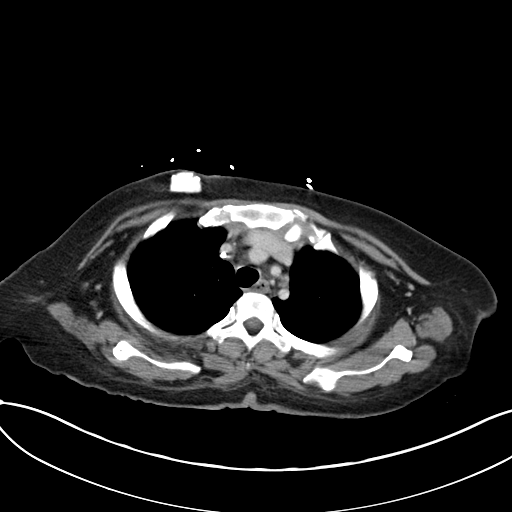

[Series 3: coronals · coronal · 0.83mm/px · 3 of 125 slices shown]
[im 25/125  mediastinal]
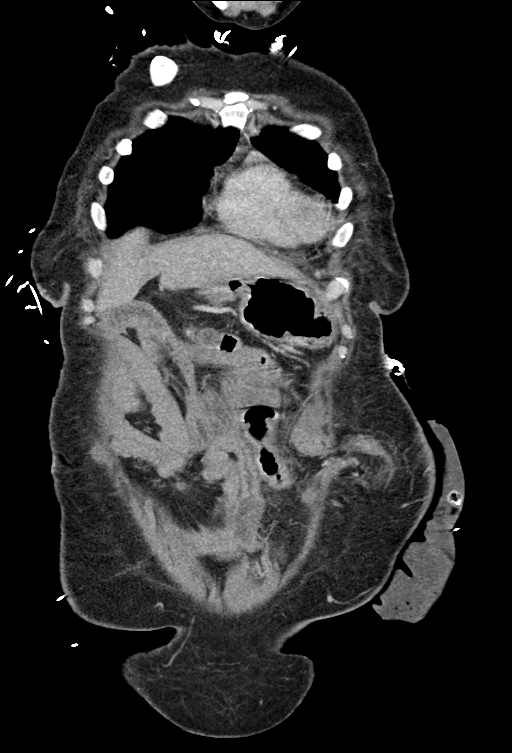
[im 50/125  mediastinal]
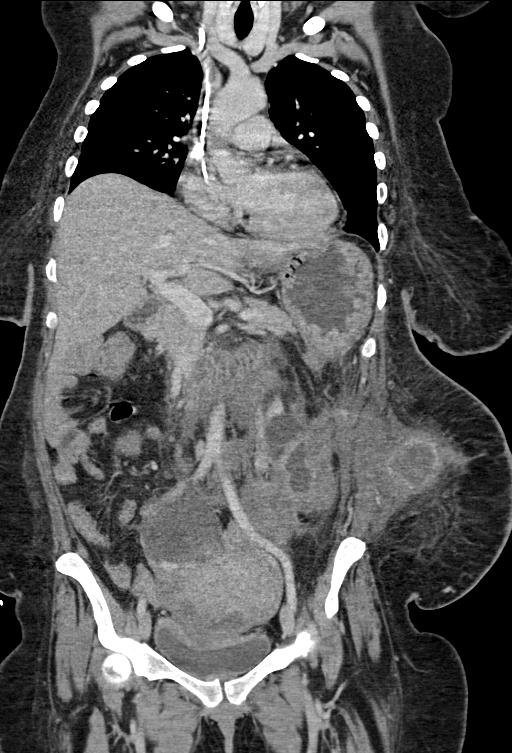
[im 75/125  mediastinal]
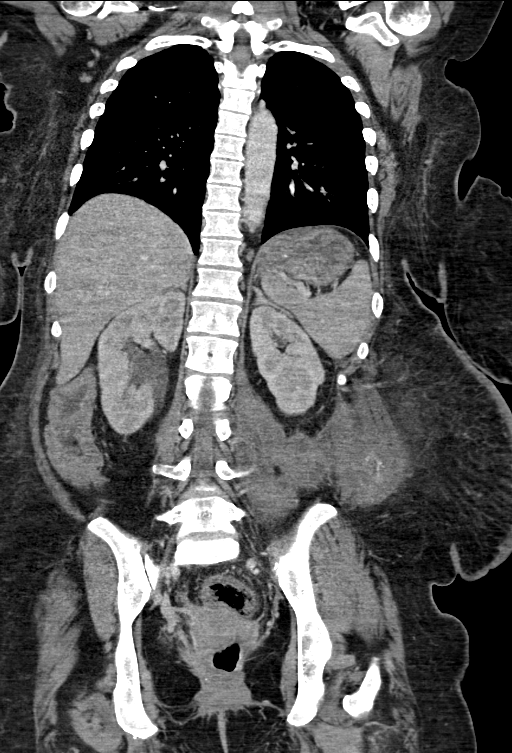

[13 of 36 positions shown; findings below may reference images not displayed]

RADIATION DOSE REDUCTION: This exam was performed according to the
departmental dose-optimization program which includes automated
exposure control, adjustment of the mA and/or kV according to
patient size and/or use of iterative reconstruction technique.

CONTRAST:  100mL OMNIPAQUE IOHEXOL 300 MG/ML  SOLN
FINDINGS: CT CHEST FINDINGS

Cardiovascular: Thoracic aorta shows no aneurysmal dilatation. Mild
coronary calcifications are seen. The heart is at the upper limits
of normal in size. Right chest wall port is seen. The pulmonary
artery as visualized is within normal limits although not timed for
embolus evaluation.

Mediastinum/Nodes: Thoracic inlet is within normal limits. No hilar
or mediastinal adenopathy is noted. The esophagus is within normal
limits.

Lungs/Pleura: Some mild scarring is noted in the right lower lobe.
No focal infiltrate or sizable effusion is noted. No parenchymal
nodule is seen.

Musculoskeletal: No acute bony abnormality is noted. Degenerative
changes of the thoracic spine are seen. No rib fracture is noted.

CT ABDOMEN PELVIS FINDINGS

Hepatobiliary: Liver is within normal limits. Gallbladder
demonstrates evidence of cholelithiasis similar to that seen on the
prior study.

Pancreas: Unremarkable. No pancreatic ductal dilatation or
surrounding inflammatory changes.

Spleen: Normal in size without focal abnormality.

Adrenals/Urinary Tract: Right adrenal gland is within normal limits.
Left adrenal gland demonstrates a 3.8 cm mass consistent with
metastatic disease. Kidneys show a normal enhancement pattern
bilaterally. Normal excretion is seen. No calculi are noted. Left
renal cyst is noted stable from the prior study.

Stomach/Bowel: Hartmann's pouch is noted. Left-sided colostomy is
identified without obstructive change. No peristomal hernia is seen.
Small bowel is unremarkable. The stomach is within normal limits.
Appendix is not well visualized.

Vascular/Lymphatic: Aorta and iliac vessels are within normal
limits. Small gastrohepatic lymph nodes are again seen. Mild Peri
aortic lymph nodes are noted and stable

Reproductive: Uterus shows fibroid change with calcified lesion
identified. Stable cystic lesions are noted in the adnexa
bilaterally the right lesion measures up to 5.9 cm in greatest
dimension which is decreased from the prior exam at which time it
measured 8 7 cm. The left-sided lesion measures up to 5.9 cm also
somewhat decreased when compared with the prior exam.

Other: Soft tissue mass lesion is noted in the left flank below the
kidney and extending through the abdominal wall similar to that seen
on the prior study. There is also involvement of the left psoas
muscle identified seen on the prior study. Soft tissue nodule is
noted with mesentery best seen on image number 75 of series 2
measuring up to 2.5 cm. This is roughly stable from the prior study.
The other smaller soft tissue deposits in the perineum are less well
visualized on today's exam.

Musculoskeletal: Sclerotic lesion is noted in the body of L2 likely
representing metastatic disease. No new focal abnormality is seen.
IMPRESSION: CT of the chest: No acute metastatic disease is seen.

CT of the abdomen and pelvis: Persistent large soft tissue mass
lesion arising below the left kidney and extending through the
abdominal wall into the subcutaneous tissues consistent with
metastatic disease. This is stable from the prior exam. Involvement
of the left psoas muscle is noted as well.

Left adrenal mass consistent with metastatic disease stable from the
prior study.

Cholelithiasis without complicating factors.

Soft tissue lesion in the mesentery is noted on the right relatively
stable from the prior exam. Smaller peritoneal nodules are less well
visualized today's study likely blending with the adjacent bowel
loops in the right abdomen.

Metastatic disease in the L2 vertebral body.

## 2022-08-31 ENCOUNTER — Inpatient Hospital Stay: Payer: BC Managed Care – PPO | Admitting: Nurse Practitioner

## 2022-08-31 ENCOUNTER — Inpatient Hospital Stay: Payer: BC Managed Care – PPO

## 2022-08-31 NOTE — Progress Notes (Deleted)
Patient Care Team: [name], MD as PCP - General (Internal Medicine) [name], MD as PCP - Cardiology (Cardiology) [name], [name] as Physician Assistant (Emergency Medicine) [name], MD as Consulting Physician (Oncology) [name], MD as Consulting Physician (General Surgery) [name], [name], MD as Consulting Physician (Neurology) [name], [name], MD as Consulting Physician (Obstetrics and Gynecology) [name], MD as Consulting Physician (Gastroenterology) [name], [name], NP as Nurse Practitioner (Nurse Practitioner)   CHIEF COMPLAINT: Follow up metastatic colon cancer   Oncology History Overview Note   Cancer Staging  Malignant neoplasm of sigmoid colon [facility]) Staging form: Colon and Rectum, AJCC 8th Edition - Pathologic stage from 04/15/2021: Stage IIC (pT4b, pN0, cM0) - Signed by [name], MD on 06/12/2021    Malignant neoplasm of sigmoid colon [facility])   Initial Diagnosis   Malignant neoplasm of sigmoid colon ([place])   11/04/2020 Imaging   EXAM: CT ABDOMEN AND PELVIS WITHOUT CONTRAST  IMPRESSION: 1. Perforating descending colonic diverticulitis with multiple abdominopelvic gas and fluid collections, measuring up to 5.7 cm and further described above. 2. Cholelithiasis without findings of acute cholecystitis. 3. 3.6 cm benign left adrenal adenoma. 4. Leiomyomatous uterus. 5. 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   11/07/2020 Imaging   EXAM: CT ABDOMEN AND PELVIS WITHOUT CONTRAST  IMPRESSION: Continued wall thickening is seen involving descending colon suggesting infectious or inflammatory colitis or perforated diverticulitis. There is an adjacent fluid collection measuring 7.0 x 5.0 cm consistent with abscess which is significantly enlarged compared to prior exam. Adjacent to the abscess, there is a  severely thickened small bowel loop most consistent with secondary inflammation.   5.2 x 3.5 cm fluid collection is noted within the left psoas muscle consistent with abscess which is significantly enlarged compared to prior exam. 4.4 x 3.8 cm fluid collection consistent with abscess is noted in the left retroperitoneal region which is also enlarged compared to prior exam.   Fibroid uterus.   Cholelithiasis.   11/27/2020 Imaging   EXAM: CT ABDOMEN AND PELVIS WITHOUT CONTRAST  IMPRESSION: 1. Significantly interval decreased size of the previously visualized left retroperitoneal and left anterior mid abdominal fluid collections. There is persistent fat stranding and mural thickening of the mid descending colon at this level. 2. Unchanged leiomyomatous uterus. 3. Unchanged cholelithiasis.   12/11/2020 Imaging   EXAM: CT ABDOMEN AND PELVIS WITHOUT CONTRAST  IMPRESSION: 1. Stable inflammatory changes centered around the distal descending colon in the left lower abdomen. Pericolonic inflammatory changes have minimally changed since 11/27/2020. Small pocket of gas medial to the colon is probably associated with a fistula or small residual abscess collection. 2. Stable position of the two percutaneous drains. The more anterior drain may be extending through a portion of the small bowel. 3. Cholelithiasis. 4. Fibroid uterus. Cannot exclude an ovarian/adnexal cystic structure near the uterine fundus. 5. Left adrenal adenoma.   01/07/2021 Imaging   EXAM: CT ABDOMEN AND PELVIS WITH CONTRAST  IMPRESSION: 1. No new abscesses identified. Similar degree of soft tissue thickening seen in the left pericolonic region. The degree of persistent soft tissues thickening in the pericolonic space further raises suspicions for malignancy. Further evaluation with colonoscopy should be performed. 2. Left retroperitoneal abscess drain unchanged in position. Anterior left abdominal drain again seen  terminating within small bowel loop. 3. 2.6 cm mildly sclerotic lesion noted in the L2 vertebral body. Further evaluation with contrast  enhanced lumbar spine MRI should be performed.   02/04/2021 Imaging   EXAM: CT ABDOMEN AND PELVIS WITH CONTRAST  IMPRESSION: No new abdominopelvic collections or abscess development in the 1 month interval.   Left anterior drain remains within a loop of small bowel, unchanged.   Left lateral abscess drain remains in the retroperitoneal space adjacent to the iliopsoas muscle with a small amount of residual fluid and air but no measurable collection.   Stable soft tissue prominence and pericolonic strandy edema/inflammation about the left descending colon compatible with residual diverticulitis/colitis. Difficult to exclude underlying transmural lesion.   04/01/2021 Imaging   EXAM: CT ABDOMEN AND PELVIS WITH CONTRAST  IMPRESSION: There appears to be significant enhancement and wall thickening involving the descending colon with some degree of traction and involvement of adjacent small bowel loops. This is consistent with the history of diverticulitis and perforation. Stable position of percutaneous drainage catheter is seen adjacent to left psoas muscle with no significant residual fluid remaining. The other percutaneous drainage catheter that was previously noted to be within small bowel loop on prior exam in the left lower quadrant, has significantly retracted and appears to be outside of the peritoneal space at this time.   Since the prior exam, there does appear to be some degree of rotation involving mesenteric vessels and structures in the right lower quadrant, suggesting partial volvulus or malrotation. There is the interval development of several lymph nodes in this area, most likely inflammatory or reactive in etiology. Mild amount of free fluid is also noted in the pelvis. However, no significant bowel wall thickening or dilatation is  seen in this area. These results will be called to the ordering clinician or representative by the Radiologist Assistant, and communication documented in the PACS or zVision Dashboard.   Stable uterine fibroid.   Hepatic steatosis.   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   Cholelithiasis.   04/10/2021 Imaging   EXAM: CT ANGIOGRAPHY CHEST CT ABDOMEN AND PELVIS WITH CONTRAST  IMPRESSION: 1. Again seen are findings compatible with descending colon diverticulitis with perforation. Free air and inflammation have increased in the interval. 2. New lobulated enhancing fluid collection posterior to this segment of inflamed colon measuring 8.5 x 3.5 x 10.0 cm. This collection now invades the adjacent iliopsoas muscle as well as extends through the left lateral abdominal wall. The tip of the drainage catheter is in this collection. Findings are compatible with abscess. 3. Anterior left percutaneous drainage catheter tip has been pulled back and is now within the subcutaneous tissues. 4. Trace free fluid. 5. No pulmonary embolism.  No acute cardiopulmonary process. 6. Cholelithiasis. 7. Fatty infiltration of the liver.   04/15/2021 Definitive Surgery   FINAL MICROSCOPIC DIAGNOSIS:   A. SMALL BOWEL, RESECTION:  - Adenocarcinoma.  - No carcinoma identified in 1 lymph node.   B. PERFORATED LEFT COLON, RESECTION:  - Moderately differentiated colonic adenocarcinoma.  - Tumor extends into pericolonic adipose tissue, and is strongly  suggestive of invasion into small bowel.  See oncology table/comments.  - No carcinoma identified in 4 lymph nodes (0/4).  - Tubular adenoma with high-grade dysplasia, 1.  - Tubular adenomas with low grade dysplasia, 3.   Comments: The size of the tumor is difficult to estimate secondary to the disrupted nature of the specimen, as well as the infiltrative nature of the tumor.  Tumor can be identified as definitely invading into the pericolonic adipose tissue  (block B4), but given the extreme disruption of the tissue,  and the involvement of the small bowel by what appears to be a colonic adenocarcinoma, I favor perforation of the large bowel with direct invasion into the small bowel.  Accordingly, I believe this is best regarded as a pT4b lesion.   ADDENDUM:  Mismatch Repair Protein (IHC)  SUMMARY INTERPRETATION: ABNORMAL  There is loss of the major MMR protein [name]6: This indicates a high probability that a hereditary germline mutation is present and referral to genetic counseling is warranted. It is recommended that the loss of protein expression be correlated with molecular based microsatellite instability testing.   IHC EXPRESSION RESULTS  TEST           RESULT  MLH1:          Preserved nuclear expression  [name]2:          Preserved nuclear expression  [name]6:          LOSS OF NUCLEAR EXPRESSION  PMS2:          Preserved nuclear expression    04/15/2021 Cancer Staging   Staging form: Colon and Rectum, AJCC 8th Edition - Pathologic stage from 04/15/2021: Stage IIC (pT4b, pN0, cM0) - Signed by [name], MD on 06/12/2021 Stage prefix: Initial diagnosis Total positive nodes: 0 Histologic grading system: 4 grade system Histologic grade (G): G2 Residual tumor (R): R0 - None   04/29/2021 Imaging   EXAM: CT ABDOMEN AND PELVIS WITH CONTRAST  IMPRESSION: Continued left retroperitoneal abscesses inferior to the left kidney, involving the left psoas muscle and left abdominal wall musculature. Overall size is decreased since prior study. Interval removal of left lower quadrant abscess drainage catheter.   New fluid collection in the cul-de-sac and wrapping around the uterus concerning for abscess.   Cholelithiasis.   Small left pleural effusion.  Bibasilar atelectasis.   Stable left adrenal adenoma.  ADDENDUM: After discussing the case with Dr. Dwaine Gale in interventional radiology, it was noted that the fluid in the pelvis originally thought to  be in the cul-de-sac is likely within the vagina, best seen on sagittal imaging. Recommend speculum exam.   Also, in the left lateral wall in the area of prior abscess in the lateral wall abdominal musculature, some of the abdomen soft tissue appears to be enhancing. While this could be infectious, cannot exclude tumor seeding in the left lateral abdominal wall, measuring 6.9 x 3.8 cm on image 64 of series 2.   06/16/2021 Imaging   EXAM: CT ABDOMEN AND PELVIS WITH CONTRAST  IMPRESSION: Increased size of bulky soft tissue masses involving the left psoas muscle and left lateral abdominal wall soft tissues, consistent with progressive metastatic disease.   Significant progression of multiple peritoneal masses throughout the abdomen and pelvis, consistent with peritoneal carcinomatosis.   New mild retroperitoneal lymphadenopathy, consistent with metastatic disease.   Stable uterine fibroid and left adrenal adenoma.   Cholelithiasis, without evidence of cholecystitis.   06/22/2021 - 08/19/2021 Chemotherapy   Patient is on Treatment Plan : COLORECTAL FOLFOX q14d x 6 months     06/22/2021 Tumor Marker   Patient's tumor was tested for the following markers: CEA. Results of the tumor marker test revealed 87.41.   07/10/2021 Pathology Results   FINAL MICROSCOPIC DIAGNOSIS:   A. PERITONEAL MASS, LEFT UPPER ABDOMINAL QUADRANT, BIOPSY:  -  Metastatic adenocarcinoma with necrosis, histologically consistent with colon primary.     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 The report date is 07/16/2021. The  remainder of testing was negative/normal.  The CancerNext-Expanded + RNAinsight gene panel offered by [employer] and includes sequencing and rearrangement analysis for the following 77 genes: IP, ALK, APC*, ATM*, AXIN2, BAP1, BARD1, BLM, BMPR1A, BRCA1*, BRCA2*, BRIP1*, CDC73, CDH1*,CDK4, CDKN1B, CDKN2A, CHEK2*, CTNNA1,  DICER1, FANCC, FH, FLCN, GALNT12, KIF1B, LZTR1, MAX, MEN1, MET, MLH1*, [name]2*, [name]3, [name]6*, MUTYH*, NBN, NF1*, NF2, NTHL1, PALB2*, PHOX2B, PMS2*, POT1, PRKAR1A, PTCH1, PTEN*, RAD51C*, RAD51D*,RB1, RECQL, RET, SDHA, SDHAF2, SDHB, SDHC, SDHD, SMAD4, SMARCA4, SMARCB1, SMARCE1, STK11, SUFU, TMEM127, TP53*,TSC1, TSC2, VHL and XRCC2 (sequencing and deletion/duplication); EGFR, EGLN1, HOXB13, KIT, MITF, PDGFRA, POLD1 and POLE (sequencing only); EPCAM and GREM1 (deletion/duplication only).   08/28/2021 Imaging   EXAM: CT CHEST, ABDOMEN, AND PELVIS WITH CONTRAST  IMPRESSION: 1. Continued interval progression of multiple metastatic soft tissue nodules/masses in the abdomen and pelvis. 2. No evidence for metastatic disease in the chest. 3. Similar appearance of the subtle lesion in the L2 vertebral body, suspicious for metastatic involvement. 4. Cholelithiasis.   09/03/2021 -  Chemotherapy   Patient is on Treatment Plan : COLORECTAL Pembrolizumab (200) q21d     09/08/2021 - 02/01/2022 Chemotherapy   Patient is on Treatment Plan : COLORECTAL Pembrolizumab (200) q21d     01/08/2022 Imaging   CT CAP IMPRESSION: 1. Mixed response to therapy. Interval decrease in size of the conglomerate multilobulated mass extending from the left psoas into the left posterior pararenal space through the left lateral abdominal wall along the tract of the previously placed percutaneous drainage catheter. Interval decrease in size of right adnexal mass and omental masses within the right lower quadrant of the abdomen. Interval development of peritoneal carcinomatosis with omental caking within the left lower quadrant of the abdomen and left subdiaphragmatic region adjacent to the spleen and along the left pericolic gutter as well as development of ascites appearing loculated within the anterior peritoneum. 2. Interval development of mild left hydronephrosis secondary to stricturing of the mid left ureter. 3. Stable left adrenal  metastasis. 4. Stable sclerotic lesion within the L2 vertebral body. No new lytic or blastic bone lesions are seen. 5. Cholelithiasis. 6. No evidence of intrathoracic metastatic disease. 7. 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 Moderate colonic stool burden. No evidence of obstruction.   03/15/2022 Imaging   IMPRESSION: 1. Status post sigmoid colon resection with left lower quadrant end colostomy. 2. Multicystic bilateral ovarian lesions are slightly increased in size. 3. Diminished, small volume of loculated appearing ascites throughout the abdomen and pelvis, with similar appearance of peritoneal thickening and nodularity throughout. 4. Heterogeneously calcified, conglomerate masses in the left retroperitoneum and abdominal wall are slightly diminished in size. 5. Left adrenal lesion not significantly changed. 6. Unchanged faintly sclerotic metastatic lesion of L2. 7. Constellation of findings is consistent with mixed response to treatment however with overall little interval change. 8. Minimal residual left hydronephrosis and proximal hydroureter, improved compared to prior examination. 9. Cholelithiasis. 10. Coronary artery disease, significantly advanced for patient age.   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 Last seen by Dr. Burr Medico 08/11/22 and completed cycle 10 keytruda.  ROS   Past Medical History:  Diagnosis Date   Allergy    seasonal   Anemia    Blood infection 1985   Blood transfusion without reported diagnosis    colon ca 04/2021   Diverticulitis    Family  history of breast cancer    Family history of colon cancer    Family history of colon cancer    Family history of stomach cancer    Hypertension    Obesity    Sleep apnea      Past Surgical History:  Procedure Laterality Date   COLECTOMY WITH COLOSTOMY CREATION/HARTMANN PROCEDURE N/A 04/15/2021   Procedure: COLOSTOMY CREATION/HARTMANN PROCEDURE;  Surgeon: [name], MD;  Location: [facility];  Service: General;  Laterality: N/A;   IR CATHETER TUBE CHANGE  12/12/2020   IR CATHETER TUBE CHANGE  01/27/2021   IR CATHETER TUBE CHANGE  02/19/2021   IR CATHETER TUBE CHANGE  04/13/2021   IR IMAGING GUIDED PORT INSERTION  10/02/2021   IR RADIOLOGIST EVAL & MGMT  11/27/2020   IR RADIOLOGIST EVAL & MGMT  12/11/2020   IR RADIOLOGIST EVAL & MGMT  01/07/2021   IR RADIOLOGIST EVAL & MGMT  02/04/2021   IR SINUS/FIST TUBE CHK-NON GI  12/12/2020   IR SINUS/FIST TUBE CHK-NON GI  02/19/2021   LAPAROSCOPIC PARTIAL COLECTOMY N/A 04/15/2021   Procedure: LAPAROSCOPIC ASSISTED HARTMANN RESECTION;  Surgeon: [name], MD;  Location: [facility];  Service: General;  Laterality: N/A;     Outpatient Encounter Medications as of 08/31/2022  Medication Sig   ALPRAZolam (XANAX) 0.25 MG tablet Take 1 tablet (0.25 mg total) by mouth at bedtime as needed for anxiety.   amLODipine (NORVASC) 10 MG tablet TAKE 1 TABLET BY MOUTH EVERY DAY   DULoxetine (CYMBALTA) 60 MG capsule TAKE 1 CAPSULE BY MOUTH EVERY DAY   lidocaine-prilocaine (EMLA) cream Apply 1 Application topically as needed.   metoprolol tartrate 75 MG TABS Take 75 mg by mouth 2 (two) times daily.   oxyCODONE-acetaminophen (PERCOCET) 7.5-325 MG tablet Take 1 tablet by mouth every 8 (eight) hours as needed for severe pain.   POTASSIUM PO Take by mouth.   [DISCONTINUED] prochlorperazine (COMPAZINE) 10 MG tablet Take 1 tablet (10 mg total) by mouth every 6 (six) hours as needed (Nausea or vomiting).   Facility-Administered Encounter Medications as of 08/31/2022   Medication   fentaNYL (SUBLIMAZE) injection   midazolam (VERSED) injection     There were no vitals filed for this visit. There is no height or weight on file to calculate BMI.   PHYSICAL EXAM GENERAL:alert, no distress and comfortable SKIN: no rash  EYES: sclera clear NECK: without mass LYMPH:  no palpable cervical or supraclavicular lymphadenopathy  LUNGS: clear with normal breathing effort HEART: regular rate & rhythm, no lower extremity edema ABDOMEN: abdomen soft, non-tender and normal bowel sounds NEURO: alert & oriented x 3 with fluent speech, no focal motor/sensory deficits Breast exam:  PAC without erythema    CBC    Component Value Date/Time   WBC 4.5 08/11/2022 0938   WBC 6.0 10/10/2021 0420   RBC 4.00 08/11/2022 0938   HGB 11.4 (L) 08/11/2022 0938   HGB 11.1 10/02/2019 1127   HCT 33.7 (L) 08/11/2022 0938   HCT 35.8 10/02/2019 1127   PLT 365 08/11/2022 0938   PLT 557 (H) 10/02/2019 1127   MCV 84.3 08/11/2022 0938   MCV 80 10/02/2019 1127   MCH 28.5 08/11/2022 0938   MCHC 33.8 08/11/2022 0938  RDW 15.3 08/11/2022 0938   RDW 14.7 10/02/2019 1127   LYMPHSABS 1.5 08/11/2022 0938   LYMPHSABS 2.5 08/16/2018 1126   MONOABS 0.3 08/11/2022 0938   EOSABS 0.1 08/11/2022 0938   EOSABS 0.0 08/16/2018 1126   BASOSABS 0.0 08/11/2022 0938   BASOSABS 0.1 08/16/2018 1126     CMP     Component Value Date/Time   NA 138 08/11/2022 0938   NA 139 12/21/2019 1024   K 3.9 08/11/2022 0938   CL 109 08/11/2022 0938   CO2 23 08/11/2022 0938   GLUCOSE 95 08/11/2022 0938   BUN 12 08/11/2022 0938   BUN 13 12/21/2019 1024   CREATININE 0.66 08/11/2022 0938   CALCIUM 8.7 (L) 08/11/2022 0938   PROT 7.3 08/11/2022 0938   PROT 7.4 07/11/2019 1638   ALBUMIN 3.9 08/11/2022 0938   ALBUMIN 4.6 07/11/2019 1638   AST 15 08/11/2022 0938   ALT 11 08/11/2022 0938   ALKPHOS 59 08/11/2022 0938   BILITOT 0.4 08/11/2022 0938   GFRNONAA >60 08/11/2022 0938   GFRAA 96 12/21/2019 1024      ASSESSMENT & PLAN:  PLAN:  No orders of the defined types were placed in this encounter.     All questions were answered. The patient knows to call the clinic with any problems, questions or concerns. No barriers to learning were detected. I spent *** counseling the patient face to face. The total time spent in the appointment was *** and more than 50% was on counseling, review of test results, and coordination of care.   Mikayla Rue, NP-C @DATE @

## 2022-09-01 ENCOUNTER — Inpatient Hospital Stay: Payer: BC Managed Care – PPO

## 2022-09-01 ENCOUNTER — Inpatient Hospital Stay: Payer: BC Managed Care – PPO | Admitting: Hematology

## 2022-09-01 ENCOUNTER — Inpatient Hospital Stay (HOSPITAL_BASED_OUTPATIENT_CLINIC_OR_DEPARTMENT_OTHER): Payer: BC Managed Care – PPO | Admitting: Nurse Practitioner

## 2022-09-01 ENCOUNTER — Encounter: Payer: Self-pay | Admitting: Nurse Practitioner

## 2022-09-01 VITALS — BP 134/77 | HR 77 | Temp 98.5°F | Resp 18

## 2022-09-01 DIAGNOSIS — D3502 Benign neoplasm of left adrenal gland: Secondary | ICD-10-CM | POA: Diagnosis not present

## 2022-09-01 DIAGNOSIS — C187 Malignant neoplasm of sigmoid colon: Secondary | ICD-10-CM

## 2022-09-01 DIAGNOSIS — K8020 Calculus of gallbladder without cholecystitis without obstruction: Secondary | ICD-10-CM | POA: Diagnosis not present

## 2022-09-01 DIAGNOSIS — C786 Secondary malignant neoplasm of retroperitoneum and peritoneum: Secondary | ICD-10-CM | POA: Diagnosis not present

## 2022-09-01 DIAGNOSIS — R609 Edema, unspecified: Secondary | ICD-10-CM | POA: Diagnosis not present

## 2022-09-01 DIAGNOSIS — K760 Fatty (change of) liver, not elsewhere classified: Secondary | ICD-10-CM | POA: Diagnosis not present

## 2022-09-01 DIAGNOSIS — Z79899 Other long term (current) drug therapy: Secondary | ICD-10-CM | POA: Diagnosis not present

## 2022-09-01 DIAGNOSIS — Z515 Encounter for palliative care: Secondary | ICD-10-CM

## 2022-09-01 DIAGNOSIS — C7951 Secondary malignant neoplasm of bone: Secondary | ICD-10-CM | POA: Diagnosis not present

## 2022-09-01 DIAGNOSIS — D509 Iron deficiency anemia, unspecified: Secondary | ICD-10-CM

## 2022-09-01 DIAGNOSIS — I10 Essential (primary) hypertension: Secondary | ICD-10-CM | POA: Diagnosis not present

## 2022-09-01 DIAGNOSIS — J90 Pleural effusion, not elsewhere classified: Secondary | ICD-10-CM | POA: Diagnosis not present

## 2022-09-01 DIAGNOSIS — Z5112 Encounter for antineoplastic immunotherapy: Secondary | ICD-10-CM | POA: Diagnosis not present

## 2022-09-01 DIAGNOSIS — Z1509 Genetic susceptibility to other malignant neoplasm: Secondary | ICD-10-CM | POA: Diagnosis not present

## 2022-09-01 DIAGNOSIS — Z452 Encounter for adjustment and management of vascular access device: Secondary | ICD-10-CM

## 2022-09-01 LAB — CMP (CANCER CENTER ONLY)
ALT: 12 U/L (ref 0–44)
AST: 13 U/L — ABNORMAL LOW (ref 15–41)
Albumin: 3.8 g/dL (ref 3.5–5.0)
Alkaline Phosphatase: 53 U/L (ref 38–126)
Anion gap: 7 (ref 5–15)
BUN: 16 mg/dL (ref 6–20)
CO2: 22 mmol/L (ref 22–32)
Calcium: 8.5 mg/dL — ABNORMAL LOW (ref 8.9–10.3)
Chloride: 109 mmol/L (ref 98–111)
Creatinine: 0.73 mg/dL (ref 0.44–1.00)
GFR, Estimated: >60 mL/min (ref >60)
Glucose, Bld: 92 mg/dL (ref 70–99)
Potassium: 3.8 mmol/L (ref 3.5–5.1)
Sodium: 138 mmol/L (ref 135–145)
Total Bilirubin: 0.3 mg/dL (ref 0.3–1.2)
Total Protein: 7.2 g/dL (ref 6.5–8.1)

## 2022-09-01 LAB — CBC WITH DIFFERENTIAL (CANCER CENTER ONLY)
Abs Immature Granulocytes: 0.03 10*3/uL (ref 0.00–0.07)
Basophils Absolute: 0 10*3/uL (ref 0.0–0.1)
Basophils Relative: 1 %
Eosinophils Absolute: 0.1 10*3/uL (ref 0.0–0.5)
Eosinophils Relative: 2 %
HCT: 34 % — ABNORMAL LOW (ref 36.0–46.0)
Hemoglobin: 11.3 g/dL — ABNORMAL LOW (ref 12.0–15.0)
Immature Granulocytes: 1 %
Lymphocytes Relative: 25 %
Lymphs Abs: 1.5 10*3/uL (ref 0.7–4.0)
MCH: 28.2 pg (ref 26.0–34.0)
MCHC: 33.2 g/dL (ref 30.0–36.0)
MCV: 84.8 fL (ref 80.0–100.0)
Monocytes Absolute: 0.3 10*3/uL (ref 0.1–1.0)
Monocytes Relative: 5 %
Neutro Abs: 4 10*3/uL (ref 1.7–7.7)
Neutrophils Relative %: 66 %
Platelet Count: 343 10*3/uL (ref 150–400)
RBC: 4.01 MIL/uL (ref 3.87–5.11)
RDW: 14.7 % (ref 11.5–15.5)
WBC Count: 6.1 10*3/uL (ref 4.0–10.5)
nRBC: 0 % (ref 0.0–0.2)

## 2022-09-01 LAB — PREGNANCY, URINE: Preg Test, Ur: NEGATIVE

## 2022-09-01 MED ORDER — SODIUM CHLORIDE 0.9 % IV SOLN: Freq: Once | INTRAVENOUS | Status: AC

## 2022-09-01 MED ORDER — SODIUM CHLORIDE 0.9 % IV SOLN
200.0000 mg | Freq: Once | INTRAVENOUS | Status: AC
  Administered 2022-09-01: 200 mg via INTRAVENOUS
  Filled 2022-09-01: qty 8

## 2022-09-01 MED ORDER — SODIUM CHLORIDE 0.9 % IV SOLN
200.0000 mg | Freq: Once | INTRAVENOUS | Status: AC
  Administered 2022-09-01: 200 mg via INTRAVENOUS
  Filled 2022-09-01: qty 200

## 2022-09-01 MED ORDER — SODIUM CHLORIDE 0.9% FLUSH
10.0000 mL | INTRAVENOUS | Status: DC | PRN
  Administered 2022-09-01: 10 mL

## 2022-09-01 MED ORDER — HEPARIN SOD (PORK) LOCK FLUSH 100 UNIT/ML IV SOLN
500.0000 [IU] | Freq: Once | INTRAVENOUS | Status: AC | PRN
  Administered 2022-09-01: 500 [IU]

## 2022-09-01 NOTE — Progress Notes (Signed)
Pt declined staying for 30-minute post observation after Venofer infusion. Pt d/c to lobby in stable condition. See flowsheets for VS.

## 2022-09-01 NOTE — Progress Notes (Signed)
Patient Care Team: [name], MD as PCP - General (Internal Medicine) [name], MD as PCP - Cardiology (Cardiology) [name], [name] as Physician Assistant (Emergency Medicine) [name], MD as Consulting Physician (Oncology) [name], MD as Consulting Physician (General Surgery) [name], [name], MD as Consulting Physician (Neurology) [name], [name], MD as Consulting Physician (Obstetrics and Gynecology) [name], MD as Consulting Physician (Gastroenterology) [name], [name], NP as Nurse Practitioner (Nurse Practitioner)   CHIEF COMPLAINT: Follow-up metastatic colon cancer  Oncology History Overview Note   Cancer Staging  Malignant neoplasm of sigmoid colon [facility]) Staging form: Colon and Rectum, AJCC 8th Edition - Pathologic stage from 04/15/2021: Stage IIC (pT4b, pN0, cM0) - Signed by [name], MD on 06/12/2021    Malignant neoplasm of sigmoid colon [facility])   Initial Diagnosis   Malignant neoplasm of sigmoid colon ([place])   11/04/2020 Imaging   EXAM: CT ABDOMEN AND PELVIS WITHOUT CONTRAST  IMPRESSION: 1. Perforating descending colonic diverticulitis with multiple abdominopelvic gas and fluid collections, measuring up to 5.7 cm and further described above. 2. Cholelithiasis without findings of acute cholecystitis. 3. 3.6 cm benign left adrenal adenoma. 4. Leiomyomatous uterus. 5. 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   11/07/2020 Imaging   EXAM: CT ABDOMEN AND PELVIS WITHOUT CONTRAST  IMPRESSION: Continued wall thickening is seen involving descending colon suggesting infectious or inflammatory colitis or perforated diverticulitis. There is an adjacent fluid collection measuring 7.0 x 5.0 cm consistent with abscess which is significantly enlarged compared to prior exam. Adjacent to the abscess, there is a severely  thickened small bowel loop most consistent with secondary inflammation.   5.2 x 3.5 cm fluid collection is noted within the left psoas muscle consistent with abscess which is significantly enlarged compared to prior exam. 4.4 x 3.8 cm fluid collection consistent with abscess is noted in the left retroperitoneal region which is also enlarged compared to prior exam.   Fibroid uterus.   Cholelithiasis.   11/27/2020 Imaging   EXAM: CT ABDOMEN AND PELVIS WITHOUT CONTRAST  IMPRESSION: 1. Significantly interval decreased size of the previously visualized left retroperitoneal and left anterior mid abdominal fluid collections. There is persistent fat stranding and mural thickening of the mid descending colon at this level. 2. Unchanged leiomyomatous uterus. 3. Unchanged cholelithiasis.   12/11/2020 Imaging   EXAM: CT ABDOMEN AND PELVIS WITHOUT CONTRAST  IMPRESSION: 1. Stable inflammatory changes centered around the distal descending colon in the left lower abdomen. Pericolonic inflammatory changes have minimally changed since 11/27/2020. Small pocket of gas medial to the colon is probably associated with a fistula or small residual abscess collection. 2. Stable position of the two percutaneous drains. The more anterior drain may be extending through a portion of the small bowel. 3. Cholelithiasis. 4. Fibroid uterus. Cannot exclude an ovarian/adnexal cystic structure near the uterine fundus. 5. Left adrenal adenoma.   01/07/2021 Imaging   EXAM: CT ABDOMEN AND PELVIS WITH CONTRAST  IMPRESSION: 1. No new abscesses identified. Similar degree of soft tissue thickening seen in the left pericolonic region. The degree of persistent soft tissues thickening in the pericolonic space further raises suspicions for malignancy. Further evaluation with colonoscopy should be performed. 2. Left retroperitoneal abscess drain unchanged in position. Anterior left abdominal drain again seen terminating  within small bowel loop. 3. 2.6 cm mildly sclerotic lesion noted in the L2 vertebral body. Further evaluation with contrast enhanced lumbar  spine MRI should be performed.   02/04/2021 Imaging   EXAM: CT ABDOMEN AND PELVIS WITH CONTRAST  IMPRESSION: No new abdominopelvic collections or abscess development in the 1 month interval.   Left anterior drain remains within a loop of small bowel, unchanged.   Left lateral abscess drain remains in the retroperitoneal space adjacent to the iliopsoas muscle with a small amount of residual fluid and air but no measurable collection.   Stable soft tissue prominence and pericolonic strandy edema/inflammation about the left descending colon compatible with residual diverticulitis/colitis. Difficult to exclude underlying transmural lesion.   04/01/2021 Imaging   EXAM: CT ABDOMEN AND PELVIS WITH CONTRAST  IMPRESSION: There appears to be significant enhancement and wall thickening involving the descending colon with some degree of traction and involvement of adjacent small bowel loops. This is consistent with the history of diverticulitis and perforation. Stable position of percutaneous drainage catheter is seen adjacent to left psoas muscle with no significant residual fluid remaining. The other percutaneous drainage catheter that was previously noted to be within small bowel loop on prior exam in the left lower quadrant, has significantly retracted and appears to be outside of the peritoneal space at this time.   Since the prior exam, there does appear to be some degree of rotation involving mesenteric vessels and structures in the right lower quadrant, suggesting partial volvulus or malrotation. There is the interval development of several lymph nodes in this area, most likely inflammatory or reactive in etiology. Mild amount of free fluid is also noted in the pelvis. However, no significant bowel wall thickening or dilatation is seen in this  area. These results will be called to the ordering clinician or representative by the Radiologist Assistant, and communication documented in the PACS or zVision Dashboard.   Stable uterine fibroid.   Hepatic steatosis.   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   Cholelithiasis.   04/10/2021 Imaging   EXAM: CT ANGIOGRAPHY CHEST CT ABDOMEN AND PELVIS WITH CONTRAST  IMPRESSION: 1. Again seen are findings compatible with descending colon diverticulitis with perforation. Free air and inflammation have increased in the interval. 2. New lobulated enhancing fluid collection posterior to this segment of inflamed colon measuring 8.5 x 3.5 x 10.0 cm. This collection now invades the adjacent iliopsoas muscle as well as extends through the left lateral abdominal wall. The tip of the drainage catheter is in this collection. Findings are compatible with abscess. 3. Anterior left percutaneous drainage catheter tip has been pulled back and is now within the subcutaneous tissues. 4. Trace free fluid. 5. No pulmonary embolism.  No acute cardiopulmonary process. 6. Cholelithiasis. 7. Fatty infiltration of the liver.   04/15/2021 Definitive Surgery   FINAL MICROSCOPIC DIAGNOSIS:   A. SMALL BOWEL, RESECTION:  - Adenocarcinoma.  - No carcinoma identified in 1 lymph node.   B. PERFORATED LEFT COLON, RESECTION:  - Moderately differentiated colonic adenocarcinoma.  - Tumor extends into pericolonic adipose tissue, and is strongly  suggestive of invasion into small bowel.  See oncology table/comments.  - No carcinoma identified in 4 lymph nodes (0/4).  - Tubular adenoma with high-grade dysplasia, 1.  - Tubular adenomas with low grade dysplasia, 3.   Comments: The size of the tumor is difficult to estimate secondary to the disrupted nature of the specimen, as well as the infiltrative nature of the tumor.  Tumor can be identified as definitely invading into the pericolonic adipose tissue (block B4), but  given the extreme disruption of the tissue, and the  involvement of the small bowel by what appears to be a colonic adenocarcinoma, I favor perforation of the large bowel with direct invasion into the small bowel.  Accordingly, I believe this is best regarded as a pT4b lesion.   ADDENDUM:  Mismatch Repair Protein (IHC)  SUMMARY INTERPRETATION: ABNORMAL  There is loss of the major MMR protein [name]6: This indicates a high probability that a hereditary germline mutation is present and referral to genetic counseling is warranted. It is recommended that the loss of protein expression be correlated with molecular based microsatellite instability testing.   IHC EXPRESSION RESULTS  TEST           RESULT  MLH1:          Preserved nuclear expression  [name]2:          Preserved nuclear expression  [name]6:          LOSS OF NUCLEAR EXPRESSION  PMS2:          Preserved nuclear expression    04/15/2021 Cancer Staging   Staging form: Colon and Rectum, AJCC 8th Edition - Pathologic stage from 04/15/2021: Stage IIC (pT4b, pN0, cM0) - Signed by [name], MD on 06/12/2021 Stage prefix: Initial diagnosis Total positive nodes: 0 Histologic grading system: 4 grade system Histologic grade (G): G2 Residual tumor (R): R0 - None   04/29/2021 Imaging   EXAM: CT ABDOMEN AND PELVIS WITH CONTRAST  IMPRESSION: Continued left retroperitoneal abscesses inferior to the left kidney, involving the left psoas muscle and left abdominal wall musculature. Overall size is decreased since prior study. Interval removal of left lower quadrant abscess drainage catheter.   New fluid collection in the cul-de-sac and wrapping around the uterus concerning for abscess.   Cholelithiasis.   Small left pleural effusion.  Bibasilar atelectasis.   Stable left adrenal adenoma.  ADDENDUM: After discussing the case with Dr. Dwaine Gale in interventional radiology, it was noted that the fluid in the pelvis originally thought to be in the  cul-de-sac is likely within the vagina, best seen on sagittal imaging. Recommend speculum exam.   Also, in the left lateral wall in the area of prior abscess in the lateral wall abdominal musculature, some of the abdomen soft tissue appears to be enhancing. While this could be infectious, cannot exclude tumor seeding in the left lateral abdominal wall, measuring 6.9 x 3.8 cm on image 64 of series 2.   06/16/2021 Imaging   EXAM: CT ABDOMEN AND PELVIS WITH CONTRAST  IMPRESSION: Increased size of bulky soft tissue masses involving the left psoas muscle and left lateral abdominal wall soft tissues, consistent with progressive metastatic disease.   Significant progression of multiple peritoneal masses throughout the abdomen and pelvis, consistent with peritoneal carcinomatosis.   New mild retroperitoneal lymphadenopathy, consistent with metastatic disease.   Stable uterine fibroid and left adrenal adenoma.   Cholelithiasis, without evidence of cholecystitis.   06/22/2021 - 08/19/2021 Chemotherapy   Patient is on Treatment Plan : COLORECTAL FOLFOX q14d x 6 months     06/22/2021 Tumor Marker   Patient's tumor was tested for the following markers: CEA. Results of the tumor marker test revealed 87.41.   07/10/2021 Pathology Results   FINAL MICROSCOPIC DIAGNOSIS:   A. PERITONEAL MASS, LEFT UPPER ABDOMINAL QUADRANT, BIOPSY:  -  Metastatic adenocarcinoma with necrosis, histologically consistent with colon primary.     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 The report date is 07/16/2021. The remainder of  testing was negative/normal.  The CancerNext-Expanded + RNAinsight gene panel offered by [employer] and includes sequencing and rearrangement analysis for the following 77 genes: IP, ALK, APC*, ATM*, AXIN2, BAP1, BARD1, BLM, BMPR1A, BRCA1*, BRCA2*, BRIP1*, CDC73, CDH1*,CDK4, CDKN1B, CDKN2A, CHEK2*, CTNNA1, DICER1,  FANCC, FH, FLCN, GALNT12, KIF1B, LZTR1, MAX, MEN1, MET, MLH1*, [name]2*, [name]3, [name]6*, MUTYH*, NBN, NF1*, NF2, NTHL1, PALB2*, PHOX2B, PMS2*, POT1, PRKAR1A, PTCH1, PTEN*, RAD51C*, RAD51D*,RB1, RECQL, RET, SDHA, SDHAF2, SDHB, SDHC, SDHD, SMAD4, SMARCA4, SMARCB1, SMARCE1, STK11, SUFU, TMEM127, TP53*,TSC1, TSC2, VHL and XRCC2 (sequencing and deletion/duplication); EGFR, EGLN1, HOXB13, KIT, MITF, PDGFRA, POLD1 and POLE (sequencing only); EPCAM and GREM1 (deletion/duplication only).   08/28/2021 Imaging   EXAM: CT CHEST, ABDOMEN, AND PELVIS WITH CONTRAST  IMPRESSION: 1. Continued interval progression of multiple metastatic soft tissue nodules/masses in the abdomen and pelvis. 2. No evidence for metastatic disease in the chest. 3. Similar appearance of the subtle lesion in the L2 vertebral body, suspicious for metastatic involvement. 4. Cholelithiasis.   09/03/2021 -  Chemotherapy   Patient is on Treatment Plan : COLORECTAL Pembrolizumab (200) q21d     09/08/2021 - 02/01/2022 Chemotherapy   Patient is on Treatment Plan : COLORECTAL Pembrolizumab (200) q21d     01/08/2022 Imaging   CT CAP IMPRESSION: 1. Mixed response to therapy. Interval decrease in size of the conglomerate multilobulated mass extending from the left psoas into the left posterior pararenal space through the left lateral abdominal wall along the tract of the previously placed percutaneous drainage catheter. Interval decrease in size of right adnexal mass and omental masses within the right lower quadrant of the abdomen. Interval development of peritoneal carcinomatosis with omental caking within the left lower quadrant of the abdomen and left subdiaphragmatic region adjacent to the spleen and along the left pericolic gutter as well as development of ascites appearing loculated within the anterior peritoneum. 2. Interval development of mild left hydronephrosis secondary to stricturing of the mid left ureter. 3. Stable left adrenal  metastasis. 4. Stable sclerotic lesion within the L2 vertebral body. No new lytic or blastic bone lesions are seen. 5. Cholelithiasis. 6. No evidence of intrathoracic metastatic disease. 7. 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 Moderate colonic stool burden. No evidence of obstruction.   03/15/2022 Imaging   IMPRESSION: 1. Status post sigmoid colon resection with left lower quadrant end colostomy. 2. Multicystic bilateral ovarian lesions are slightly increased in size. 3. Diminished, small volume of loculated appearing ascites throughout the abdomen and pelvis, with similar appearance of peritoneal thickening and nodularity throughout. 4. Heterogeneously calcified, conglomerate masses in the left retroperitoneum and abdominal wall are slightly diminished in size. 5. Left adrenal lesion not significantly changed. 6. Unchanged faintly sclerotic metastatic lesion of L2. 7. Constellation of findings is consistent with mixed response to treatment however with overall little interval change. 8. Minimal residual left hydronephrosis and proximal hydroureter, improved compared to prior examination. 9. Cholelithiasis. 10. Coronary artery disease, significantly advanced for patient age.   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 Burr Medico 08/11/2022 and completed cycle 10 Keytruda.  She feels more forgetful, even missed her appointment yesterday.  She is not sleeping well at night.  Managing pain without medication, has not taken any this year.  She  finds otherwise to manage pain like being active/mobile.  Pain is more at night when she is still but managing.  Bowels moving normally, empties bag 5-7 times a day which is her baseline, also more her preference she does not want it to fill up.  Denies nausea/vomiting.  Eating and drinking well. LMP 2 weeks ago, no chance she is pregnant. Denies any other new or specific complaints.  ROS  All systems reviewed and negative  Past Medical History:  Diagnosis Date   Allergy    seasonal   Anemia    Blood infection 1985   Blood transfusion without reported diagnosis    colon ca 04/2021   Diverticulitis    Family history of breast cancer    Family history of colon cancer    Family history of colon cancer    Family history of stomach cancer    Hypertension    Obesity    Sleep apnea      Past Surgical History:  Procedure Laterality Date   COLECTOMY WITH COLOSTOMY CREATION/HARTMANN PROCEDURE N/A 04/15/2021   Procedure: COLOSTOMY CREATION/HARTMANN PROCEDURE;  Surgeon: [name], MD;  Location: [facility];  Service: General;  Laterality: N/A;   IR CATHETER TUBE CHANGE  12/12/2020   IR CATHETER TUBE CHANGE  01/27/2021   IR CATHETER TUBE CHANGE  02/19/2021   IR CATHETER TUBE CHANGE  04/13/2021   IR IMAGING GUIDED PORT INSERTION  10/02/2021   IR RADIOLOGIST EVAL & MGMT  11/27/2020   IR RADIOLOGIST EVAL & MGMT  12/11/2020   IR RADIOLOGIST EVAL & MGMT  01/07/2021   IR RADIOLOGIST EVAL & MGMT  02/04/2021   IR SINUS/FIST TUBE CHK-NON GI  12/12/2020   IR SINUS/FIST TUBE CHK-NON GI  02/19/2021   LAPAROSCOPIC PARTIAL COLECTOMY N/A 04/15/2021   Procedure: LAPAROSCOPIC ASSISTED HARTMANN RESECTION;  Surgeon: [name], MD;  Location: [facility];  Service: General;  Laterality: N/A;     Outpatient Encounter Medications as of 09/01/2022  Medication Sig   ALPRAZolam (XANAX) 0.25 MG tablet Take 1 tablet (0.25 mg total) by mouth at bedtime as needed for anxiety.   amLODipine (NORVASC) 10 MG tablet TAKE 1  TABLET BY MOUTH EVERY DAY   DULoxetine (CYMBALTA) 60 MG capsule TAKE 1 CAPSULE BY MOUTH EVERY DAY   lidocaine-prilocaine (EMLA) cream Apply 1 Application topically as needed.   metoprolol tartrate 75 MG TABS Take 75 mg by mouth 2 (two) times daily.   oxyCODONE-acetaminophen (PERCOCET) 7.5-325 MG tablet Take 1 tablet by mouth every 8 (eight) hours as needed for severe pain.   POTASSIUM PO Take by mouth.   [DISCONTINUED] prochlorperazine (COMPAZINE) 10 MG tablet Take 1 tablet (10 mg total) by mouth every 6 (six) hours as needed (Nausea or vomiting).   Facility-Administered Encounter Medications as of 09/01/2022  Medication   fentaNYL (SUBLIMAZE) injection   midazolam (VERSED) injection     Today's Vitals   09/01/22 0823  BP: (!) 143/85  Pulse: 82  Resp: 18  Temp: 98.9 F (37.2 C)  TempSrc: Oral  SpO2: 100%   There is no height or weight on file to calculate BMI.   PHYSICAL EXAM GENERAL:alert, no distress and comfortable SKIN: no rash  EYES: sclera clear NECK: without mass LYMPH:  no palpable cervical or supraclavicular lymphadenopathy  LUNGS: normal breathing effort HEART:  no lower extremity edema ABDOMEN: abdomen soft, non-tender and normal bowel sounds NEURO: alert & oriented x 3 with fluent speech, no focal motor/sensory deficits PAC without erythema    CBC    Component Value Date/Time   WBC 6.1 09/01/2022 0745   WBC 6.0 10/10/2021 0420   RBC 4.01 09/01/2022 0745   HGB 11.3 (L) 09/01/2022 0745   HGB 11.1 10/02/2019 1127   HCT 34.0 (L) 09/01/2022 0745   HCT 35.8 10/02/2019 1127   PLT 343 09/01/2022 0745   PLT 557 (H) 10/02/2019 1127   MCV 84.8 09/01/2022 0745   MCV 80 10/02/2019 1127   MCH 28.2 09/01/2022 0745   MCHC 33.2 09/01/2022 0745   RDW 14.7 09/01/2022 0745   RDW 14.7 10/02/2019 1127   LYMPHSABS 1.5 09/01/2022 0745   LYMPHSABS 2.5 08/16/2018 1126   MONOABS 0.3 09/01/2022 0745   EOSABS 0.1 09/01/2022 0745   EOSABS 0.0 08/16/2018 1126   BASOSABS 0.0  09/01/2022 0745   BASOSABS 0.1 08/16/2018 1126     CMP     Component Value Date/Time   NA 138 09/01/2022 0745   NA 139 12/21/2019 1024   K 3.8 09/01/2022 0745   CL 109 09/01/2022 0745   CO2 22 09/01/2022 0745   GLUCOSE 92 09/01/2022 0745   BUN 16 09/01/2022 0745   BUN 13 12/21/2019 1024   CREATININE 0.73 09/01/2022 0745   CALCIUM 8.5 (L) 09/01/2022 0745   PROT 7.2 09/01/2022 0745   PROT 7.4 07/11/2019 1638   ALBUMIN 3.8 09/01/2022 0745   ALBUMIN 4.6 07/11/2019 1638   AST 13 (L) 09/01/2022 0745   ALT 12 09/01/2022 0745   ALKPHOS 53 09/01/2022 0745   BILITOT 0.3 09/01/2022 0745   GFRNONAA >60 09/01/2022 0745   GFRAA 96 12/21/2019 1024     ASSESSMENT & PLAN: Mikayla Rios is a 37 y.o. female with    1. 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 She was found to have diverticulitis with perforation. She also developed an abscess requiring 2 drains; multiple hospital stay.  -S/p emergent small bowel and left colon resection on 04/15/21 under Dr. Johney Maine. 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 Margins negative for invasive carcinoma, but low-grade dysplasia involves proximal margin. All 5 lymph nodes negative (0/5). MSI low. - CT AP on 06/16/21 showed progressive metastatic disease involving soft tissue masses in left psoas muscle and left lateral abdominal wall and peritoneal masses, and new mild retroperitoneal lymphadenopathy. peritoneal mass bx 07/10/21 confirmed metastatic adeno, c/w colon primary  -baseline CEA on 06/22/21 was elevated at 87.41. -FO showed MSI-H disease, BRCA, [name]6, and APC mutations among others which are not targetable. 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  Due to BRCA mutation on FO, she may  be a candidate for PARP inhibitor down the line -she progressed on first line FOLFOX (06/22/21 - 08/17/21) and switch to second line pembrolizumab q. 21 days starting 09/08/2021 -CEA normalized on immunotherapy -CT 03/15/28 again showed mixed response, and PET with multiple findings overall indeterminate for mixed response to [place] vs secondary malignancy but gyn/onc felt less likely. No further gyn biopsies are recommended at this time  -Restaging CT 08/04/2022 showed stable disease. She continues [place] q3 weeks -Mikayla Rios appears stable. She continues q3 weeks Keytruda, tolerating  well overall without significant side effects.  -We discussed symptom management for brain fog, such as adequate nutrition, hydration, good sleep, minimize stress, ambulate, etc. She understands -Labs reviewed, adequate for treatment -Proceed with [place] today as scheduled -F/up and next cycle in 3 weeks  2. Symptom Management: Abdominal pain, Weight loss, rash -Pain management per palliative care.  has percocet but not neeeding -Denies pain currently  -She would like to loose weight intentionally, we discussed healthy practices. I recommend healthy diet, water, and advance activity as tolerated, but not vigorous. Avoid skipping meals/fasting.  -Will monitor closely.    3. Anxiety and depression -Due to the prolonged illness and multiple hospital stay since May 2022, and cancer diagnosis, she has been feeling depressed lately. -cymbalta prescribed 05/25/21 -She had panic attack 2/13 after chemo, when she was reacting to clorhexidine.  -F/up Dr. Michail Sermon -Stable lately   4. Genetics, APC+ (FAP) -Mismatch repair protein testing performed on her surgical sample showed MSI low, with loss of [name]6 protein. 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 However her MSI was low, instead of high, which is unusual for Lynch syndrome. -FO showed MSI-High disease, and showed multiple other mutations including APC, [name]6, and BRCA  mutations.  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  -We previously discussed genetic testing for her family. Half sister has been tested, is under Dr's care and is s/p total colectomy.  -Her children are 71, 56, and 16, oldest is autistic. They met Roma Kayser and all plan to get testing   5. Social Support -she is connected with Education officer, museum -she is working to apply for disability. Her job ended just prior to the start of her symptoms. -she has three children-- ages 35, 89, and 85.    PLAN: -Labs reviewed -Proceed with [place] and Venofer today -F/up in 3 weeks with next cycle    Orders Placed This Encounter  Procedures   Pregnancy, urine    Standing Status:   Standing    Number of Occurrences:   50    Standing Expiration Date:   09/01/2023      All questions were answered. The patient knows to call the clinic with any problems, questions or concerns. No barriers to learning were detected.    Cira Rue, NP-C 09/01/2022

## 2022-09-07 ENCOUNTER — Encounter: Payer: Self-pay | Admitting: Hematology

## 2022-09-16 NOTE — Progress Notes (Signed)
Palliative Medicine [facility]  Telephone:(336) [phone] Fax:(336) [phone]   Name: RHAWNIE HERMANS Date: 09/16/2022 MRN: [id number]  DOB: 01-11-1986  Patient Care Team: [name], MD as PCP - General (Internal Medicine) [name], MD as PCP - Cardiology (Cardiology) [name], [name] as Physician Assistant (Emergency Medicine) Malachy Mood, MD as Consulting Physician (Oncology) [name], MD as Consulting Physician (General Surgery) [name], [name], MD as Consulting Physician (Neurology) [name], [name], MD as Consulting Physician (Obstetrics and Gynecology) [name], MD as Consulting Physician (Gastroenterology) [name], [name], NP as Nurse Practitioner (Nurse Practitioner)    INTERVAL HISTORY: Mikayla Rios is a 37 y.o. 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 Recent CT scan on 06/16/21 showed progressive metastatic disease. Palliative ask to see for symptom management. Patient now receiving Keytruda and scan confirms that treatment effective in halting cancer progression.    SOCIAL HISTORY:     reports that she has never smoked. She has never used smokeless tobacco. She reports that she does not currently use alcohol. She reports that she does not use drugs.  ADVANCE DIRECTIVES: none   CODE STATUS: Full Code  PAST MEDICAL HISTORY: Past Medical History:  Diagnosis Date   Allergy    seasonal   Anemia    Blood infection 1985   Blood transfusion without reported diagnosis    colon ca 04/2021   Diverticulitis    Family history of breast cancer    Family history of colon cancer    Family history of colon cancer    Family history of stomach cancer    Hypertension    Obesity    Sleep apnea     ALLERGIES:  is allergic to chlorhexidine gluconate, shellfish allergy, and  penicillins.  MEDICATIONS:  Current Outpatient Medications  Medication Sig Dispense Refill   ALPRAZolam (XANAX) 0.25 MG tablet Take 1 tablet (0.25 mg total) by mouth at bedtime as needed for anxiety. 30 tablet 0   amLODipine (NORVASC) 10 MG tablet TAKE 1 TABLET BY MOUTH EVERY DAY 90 tablet 3   DULoxetine (CYMBALTA) 60 MG capsule TAKE 1 CAPSULE BY MOUTH EVERY DAY 90 capsule 2   lidocaine-prilocaine (EMLA) cream Apply 1 Application topically as needed. 30 g 3   metoprolol tartrate 75 MG TABS Take 75 mg by mouth 2 (two) times daily. 90 tablet 3   oxyCODONE-acetaminophen (PERCOCET) 7.5-325 MG tablet Take 1 tablet by mouth every 8 (eight) hours as needed for severe pain. 30 tablet 0   POTASSIUM PO Take by mouth.     No current facility-administered medications for this visit.   Facility-Administered Medications Ordered in Other Visits  Medication Dose Route Frequency Provider Last Rate Last Admin   fentaNYL (SUBLIMAZE) injection   Intravenous PRN Mir, Al Corpus, MD   50 mcg at 10/02/21 1436   midazolam (VERSED) injection   Intravenous PRN Mir, Al Corpus, MD   1 mg at 10/02/21 1436    VITAL SIGNS: There were no vitals taken for this visit. There were no vitals filed for this visit.    Estimated body mass index is 43.51 kg/m as calculated from the following:   Height as of 08/11/22: 5\' 3"  (1.6 m).   Weight as of 08/11/22: 245 lb 9.6 oz (111.4 kg).   PERFORMANCE STATUS (ECOG) : 1 - Symptomatic but completely ambulatory   Physical Exam General: NAD Cardiovascular:RRR Respiratory:  normal breathing pattern  Abdomen: soft, tender, + bowel sounds, colostomy in place Neurological: AAO x4, mood appropriate  IMPRESSION:  [name] continues to do well. Her husband is present with her today.  Denies nausea, vomiting, constipation, or diarrhea.  Denies any significant pain.  States she does have some neuropathic type pain which she is contemplating starting Lyrica for.  Overall she is doing  great.  Able to be active and involved with her kids which she is much appreciative of.  Appetite is good.  No symptom management needs at this time.  Patient is aware we are available as needed.  PLAN: No symptom management needs at this time.  Patient doing great. No follow-up visits needed with palliative however patient is aware we are available on as-needed basis.  Patient expressed understanding and was in agreement with this plan. She also understands that She can call the clinic at any time with any questions, concerns, or complaints.    Visit consisted of counseling and education dealing with the complex and emotionally intense issues of symptom management and palliative care in the setting of serious and potentially life-threatening illness.Greater than 50%  of this time was spent counseling and coordinating care related to the above assessment and plan.  Willette AlmaNikki [name], AGPCNP-BC  Palliative Medicine Team/[facility]  *Please note that this is a verbal dictation therefore any spelling or grammatical errors are due to the "Dragon Medical One" system interpretation.

## 2022-09-20 ENCOUNTER — Encounter: Payer: Self-pay | Admitting: Physical Medicine and Rehabilitation

## 2022-09-20 ENCOUNTER — Encounter
Payer: Managed Care, Other (non HMO) | Attending: Physical Medicine and Rehabilitation | Admitting: Physical Medicine and Rehabilitation

## 2022-09-20 VITALS — BP 139/82 | HR 104 | Ht 63.0 in | Wt 267.0 lb

## 2022-09-20 DIAGNOSIS — C187 Malignant neoplasm of sigmoid colon: Secondary | ICD-10-CM | POA: Insufficient documentation

## 2022-09-20 DIAGNOSIS — M792 Neuralgia and neuritis, unspecified: Secondary | ICD-10-CM | POA: Insufficient documentation

## 2022-09-20 DIAGNOSIS — Z515 Encounter for palliative care: Secondary | ICD-10-CM | POA: Insufficient documentation

## 2022-09-20 DIAGNOSIS — G4701 Insomnia due to medical condition: Secondary | ICD-10-CM | POA: Diagnosis present

## 2022-09-20 MED ORDER — PREGABALIN 75 MG PO CAPS: 75.0000 mg | ORAL_CAPSULE | Freq: Every day | ORAL | 5 refills | Status: DC

## 2022-09-20 MED ORDER — TRAZODONE HCL 50 MG PO TABS: 50.0000 mg | ORAL_TABLET | Freq: Every evening | ORAL | 5 refills | Status: DC | PRN

## 2022-09-20 NOTE — Progress Notes (Signed)
Subjective:    Patient ID: Mikayla Rios, female    DOB: 07-10-1985, 37 y.o.   MRN: [id number]  HPI  Patient is a 37 yr old female with debility due to colon adenocarcinoma With  Stage II colon cancer dx'd 04/15/21-  s/p small bowel and L colon resection- progressive mets- Now receiving Keytruda which is helping progression/halting.  Here for f/u on debility.    Tried Duloxetine for 2-3 weeks- didn't notice a difference, so just stopped.  When moves, exercises and keeping self busy it helps pain.  Moved to [place] last September, but doesn't really take it much-  Last took in 9/23.   Uses pain rubs- husband will rub her and hot shower will give temporary relief.  Doesn't sleep well at night- pain wakes her up.   Perocet didn't help, that's why stopped taking.  Dilaudid was stopped A lot of pain from cancer has reduced since on Keytruda, and so they switched off Dilaudid. Thought it was too strong her her.    Mother passed in October 2023.  Didn't take Xananx got in 10/23 for other passing.  Uses Emla creme for treatments-   Body pain- hands sharp and aching Arms burning pain- and aching and legs and feet, sharp/like stabbing pain. Esp bottom of feet.     Pain Inventory Average Pain 6 Pain Right Now 6 My pain is constant, sharp, tingling, and aching  In the last 24 hours, has pain interfered with the following? General activity 5 Relation with others 5 Enjoyment of life 7 What TIME of day is your pain at its worst? night Sleep (in general) Poor  Pain is worse with: inactivity Pain improves with: pacing activities Relief from Meds:  none taken  Family History  Problem Relation Age of Onset   Diabetes Mother    Hypertension Mother    Colon cancer Mother        dx at age 55   Breast cancer Mother    Congestive Heart Failure Father    Hypertension Father    Diabetes Maternal Grandmother    Hypertension Maternal Grandmother    Diabetes Maternal Grandfather     Kidney disease Maternal Grandfather    Diabetes Paternal Grandmother    Autism Son    Stomach cancer Maternal Uncle    Heart disease Paternal Aunt    Colon polyps Half-Sister        colectomy   Ovarian cancer Neg Hx    Endometrial cancer Neg Hx    Pancreatic cancer Neg Hx    Prostate cancer Neg Hx    Social History   Socioeconomic History   Marital status: Married    Spouse name: Errol   Number of children: 3   Years of education: Not on file   Highest education level: Not on file  Occupational History   Not on file  Tobacco Use   Smoking status: Never   Smokeless tobacco: Never  Vaping Use   Vaping Use: Never used  Substance and Sexual Activity   Alcohol use: Not Currently    Comment: Occ   Drug use: No   Sexual activity: Yes    Partners: Male    Birth control/protection: None  Other Topics Concern   Not on file  Social History Narrative   Not on file   Social Determinants of Health   Financial Resource Strain: High Risk (05/26/2021)   Overall Financial Resource Strain (CARDIA)    Difficulty of Paying Living Expenses: Hard  Food Insecurity: Food Insecurity Present (05/26/2021)   Hunger Vital Sign    Worried About Running Out of Food in the Last Year: Sometimes true    Ran Out of Food in the Last Year: Sometimes true  Transportation Needs: No Transportation Needs (05/26/2021)   PRAPARE - Administrator, Civil Service (Medical): No    Lack of Transportation (Non-Medical): No  Physical Activity: Not on file  Stress: Stress Concern Present (05/26/2021)   [employer] of Occupational Health - Occupational Stress Questionnaire    Feeling of Stress : Very much  Social Connections: Moderately Integrated (05/26/2021)   Social Connection and Isolation Panel [NHANES]    Frequency of Communication with Friends and Family: More than three times a week    Frequency of Social Gatherings with Friends and Family: More than three times a week    Attends  Religious Services: More than 4 times per year    Active Member of Clubs or Organizations: No    Attends Banker Meetings: Never    Marital Status: Married   Past Surgical History:  Procedure Laterality Date   COLECTOMY WITH COLOSTOMY CREATION/HARTMANN PROCEDURE N/A 04/15/2021   Procedure: COLOSTOMY CREATION/HARTMANN PROCEDURE;  Surgeon: [name], MD;  Location: [facility];  Service: General;  Laterality: N/A;   IR CATHETER TUBE CHANGE  12/12/2020   IR CATHETER TUBE CHANGE  01/27/2021   IR CATHETER TUBE CHANGE  02/19/2021   IR CATHETER TUBE CHANGE  04/13/2021   IR IMAGING GUIDED PORT INSERTION  10/02/2021   IR RADIOLOGIST EVAL & MGMT  11/27/2020   IR RADIOLOGIST EVAL & MGMT  12/11/2020   IR RADIOLOGIST EVAL & MGMT  01/07/2021   IR RADIOLOGIST EVAL & MGMT  02/04/2021   IR SINUS/FIST TUBE CHK-NON GI  12/12/2020   IR SINUS/FIST TUBE CHK-NON GI  02/19/2021   LAPAROSCOPIC PARTIAL COLECTOMY N/A 04/15/2021   Procedure: LAPAROSCOPIC ASSISTED HARTMANN RESECTION;  Surgeon: [name], MD;  Location: [facility];  Service: General;  Laterality: N/A;   Past Surgical History:  Procedure Laterality Date   COLECTOMY WITH COLOSTOMY CREATION/HARTMANN PROCEDURE N/A 04/15/2021   Procedure: COLOSTOMY CREATION/HARTMANN PROCEDURE;  Surgeon: [name], MD;  Location: [facility];  Service: General;  Laterality: N/A;   IR CATHETER TUBE CHANGE  12/12/2020   IR CATHETER TUBE CHANGE  01/27/2021   IR CATHETER TUBE CHANGE  02/19/2021   IR CATHETER TUBE CHANGE  04/13/2021   IR IMAGING GUIDED PORT INSERTION  10/02/2021   IR RADIOLOGIST EVAL & MGMT  11/27/2020   IR RADIOLOGIST EVAL & MGMT  12/11/2020   IR RADIOLOGIST EVAL & MGMT  01/07/2021   IR RADIOLOGIST EVAL & MGMT  02/04/2021   IR SINUS/FIST TUBE CHK-NON GI  12/12/2020   IR SINUS/FIST TUBE CHK-NON GI  02/19/2021   LAPAROSCOPIC PARTIAL COLECTOMY N/A 04/15/2021   Procedure: LAPAROSCOPIC ASSISTED HARTMANN RESECTION;  Surgeon: [name], MD;   Location: [facility];  Service: General;  Laterality: N/A;   Past Medical History:  Diagnosis Date   Allergy    seasonal   Anemia    Blood infection 1985   Blood transfusion without reported diagnosis    colon ca 04/2021   Diverticulitis    Family history of breast cancer    Family history of colon cancer    Family history of colon cancer    Family history of stomach cancer    Hypertension    Obesity    Sleep apnea    BP  139/82   Pulse (!) 104   Ht  (1.6 m)   Wt 267 lb (121.1 kg)   SpO2 99%   BMI 47.30 kg/m   Opioid Risk Score:   Fall Risk Score:  `1  Depression screen [facility] 2/9     07/22/2022    4:32 PM 07/22/2022    4:10 PM 10/19/2021    2:54 PM 08/12/2021   10:56 AM 07/14/2021    3:30 PM 10/06/2020    1:59 PM 08/14/2019    4:09 PM  Depression screen PHQ 2/9  Decreased Interest  0 0 0  Down, Depressed, Hopeless  0 0 1 1 0 0  PHQ - 2 Score  0 0 0  Altered sleeping Tired, decreased energy Change in appetite 0   3 1    Feeling bad or failure about yourself  0   1 0    Trouble concentrating 0   1 1    Moving slowly or fidgety/restless 0   1 1    Suicidal thoughts 0   0 0    PHQ-9 Score    16 12    Difficult doing work/chores Not difficult at all   Very difficult Somewhat difficult      Review of Systems  Musculoskeletal:        Pain in both upper arms, both knees & both feet  All other systems reviewed and are negative.      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  Here for f/u on debility and nerve pain.    Will try Lyrica 75 mg  QHS x 1 week A. Then 75 mg 2x/day x 1 week B. Then can try 75 mg in AM and 150 mg nightly- for nerve pain C. Side effects- most common are dizziness, and mild sleepiness and swelling- in hands and feet only.   2. Doesn't interact with Programmer, applications computer and pharmacy evaluation.   3. Can use Voltaren gel up to 4x/day- for hands- and other body parts- is over the counter.   4. Stop Duloxetine-  will take off list.   5. Trazodone 50-100 mg nightly- for sleep-  nightly for sleep- as needed- not addictive- sent in 60 tabs, with 5 refills.   6. F/U in 3 months- on nerve pain  7. Call me in 1 month to let me know how things going.    8. Think about referral to Ortho for B/L trigger fingers.    I spent a total of 31   minutes on total care today- >50% coordination of care- due to discussion of options for nerve pain, looking up interactions with Keytruda and discussion of other ways to treat pain as detailed above.

## 2022-09-20 NOTE — Patient Instructions (Addendum)
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  Here for f/u on debility and nerve pain.    Will try Lyrica 75 mg QHS x 1 week A. Then 75 mg 2x/day x 1 week B. Then can try 75 mg in AM and 150 mg nightly- for nerve pain C. Side effects- most common are dizziness, and mild sleepiness and swelling- in hands and feet only.   2. Doesn't interact with Programmer, applications computer and pharmacy evaluation.   3. Can use Voltaren gel up to 4x/day- for hands- and other body parts- is over the counter.   4. Stop Duloxetine-  will take off list.   5. Trazodone 50-100 mg nightly- for sleep-  nightly for sleep- as needed- not addictive- sent in 60 tabs, with 5 refills.   6. F/U in 3 months- on nerve pain  7. Call me in 1 month to let me know how things going.  8. Think about referral to Ortho for trigger fingers

## 2022-09-21 NOTE — Assessment & Plan Note (Signed)
-  MMR loss of [name]6, [name] with high mutation burden, acquired BRCA mutation (+)  --Initially presented with diarrhea 11/04/20, found to have diverticulitis with perforation and also developed an abscess requiring 2 drains, multiple hospital stay  --she started FOLFOX on 06/22/21 but did not respond -she switched to [facility] on 09/08/21. She has been tolerating well overall but has developed joint pain which could be related, overall manageable. She is clinically doing better also with more energy and less abdominal pain   -her CEA has normalized on immunotherapy. -CT CAP on 03/15/22 again showed mixed response -she had PET scan on 04/26/2022 for evaluation of her ovarian cysts, and saw GYN Dr. [name], we decided to monitor it. -She tolerating Keytruda well, with mild joint pain.  She recently started workout at the gym, which has helped her joint pain. -restaging CT from 08/04/2022 showed stable disease in left abdomen and pelvis, no new lesions.  I personally reviewed her scan images, and discussed findings with patient.   -Will continue [place]

## 2022-09-21 NOTE — Assessment & Plan Note (Signed)
-  FO showed MSI-High disease, and multiple mutations including APC, [name]6, and BRCA2 mutations.  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  -we previously discussed testing for her children when they are of age. 

## 2022-09-21 NOTE — Progress Notes (Unsigned)
[facility]   [phone] Fax:([phone]   Clinic Follow up Note   Patient Care Team: [name], MD as PCP - General (Internal Medicine) [name], MD as PCP - Cardiology (Cardiology) [name], [name] as Physician Assistant (Emergency Medicine) Malachy Mood, MD as Consulting Physician (Oncology) [name], MD as Consulting Physician (General Surgery) [name], [name], MD as Consulting Physician (Neurology) [name], [name], MD as Consulting Physician (Obstetrics and Gynecology) [name], MD as Consulting Physician (Gastroenterology) [name], [name], NP as Nurse Practitioner (Nurse Practitioner)  Date of Service:  09/22/2022  CHIEF COMPLAINT: f/u of metastatic colon cancer   CURRENT THERAPY:  Pembrolizumab q21d  ASSESSMENT:  Mikayla Rios is a 37 y.o. female with   Malignant neoplasm of sigmoid colon (HCC) -MMR loss of [name]6, [name] with high mutation burden, acquired BRCA mutation (+)  --Initially presented with diarrhea 11/04/20, found to have diverticulitis with perforation and also developed an abscess requiring 2 drains, multiple hospital stay  --she started FOLFOX on 06/22/21 but did not respond -she switched to [facility] on 09/08/21. She has been tolerating well overall but has developed joint pain which could be related, overall manageable. She is clinically doing better also with more energy and less abdominal pain   -her CEA has normalized on immunotherapy. -CT CAP on 03/15/22 again showed mixed response -she had PET scan on 04/26/2022 for evaluation of her ovarian cysts, and saw GYN Dr. [name], we decided to monitor it. -She tolerating Keytruda well, with mild joint pain.  She recently started workout at the gym, which has helped her joint pain. -restaging CT from 08/04/2022 showed stable disease in left abdomen and pelvis, no new lesions.  I personally reviewed her scan images, and discussed  findings with patient.   -Will continue Keytruda   FAP (familial adenomatous polyposis) -FO showed MSI-High disease, and multiple mutations including APC, [name]6, and BRCA2 mutations.  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  -we previously discussed testing for her children when they are of age.   Arthralgia -Probably related to Keytruda, overall mild and manageable. -She may start Lyrica   PLAN: -lab reviewed -proceed with Keytruda today -repeat scan in June and will order on next visit -lab/flush and f/u  and Keytruda 10/13/2022 - called in Lidocaine cream    SUMMARY OF ONCOLOGIC HISTORY: Oncology History Overview Note   Cancer Staging  Malignant neoplasm of sigmoid colon [facility]) Staging form: Colon and Rectum, AJCC 8th Edition - Pathologic stage from 04/15/2021: Stage IIC (pT4b, pN0, cM0) - Signed by Malachy Mood, MD on 06/12/2021    Malignant neoplasm of sigmoid colon   Initial Diagnosis   Malignant neoplasm of sigmoid colon (HCC)   11/04/2020 Imaging   EXAM: CT ABDOMEN AND PELVIS WITHOUT CONTRAST  IMPRESSION: 1. Perforating descending colonic diverticulitis with multiple abdominopelvic gas and fluid collections, measuring up to 5.7 cm and further described above. 2. Cholelithiasis without findings of acute cholecystitis. 3. 3.6 cm benign left adrenal adenoma. 4. Leiomyomatous uterus. 5. 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   11/07/2020 Imaging   EXAM: CT ABDOMEN AND PELVIS WITHOUT CONTRAST  IMPRESSION: Continued wall thickening is seen involving descending colon suggesting infectious or inflammatory colitis or perforated diverticulitis. There is an adjacent fluid collection measuring 7.0 x 5.0 cm consistent with abscess which is significantly enlarged compared to prior exam. Adjacent  to the abscess, there is a severely thickened  small bowel loop most consistent with secondary inflammation.   5.2 x 3.5 cm fluid collection is noted within the left psoas muscle consistent with abscess which is significantly enlarged compared to prior exam. 4.4 x 3.8 cm fluid collection consistent with abscess is noted in the left retroperitoneal region which is also enlarged compared to prior exam.   Fibroid uterus.   Cholelithiasis.   11/27/2020 Imaging   EXAM: CT ABDOMEN AND PELVIS WITHOUT CONTRAST  IMPRESSION: 1. Significantly interval decreased size of the previously visualized left retroperitoneal and left anterior mid abdominal fluid collections. There is persistent fat stranding and mural thickening of the mid descending colon at this level. 2. Unchanged leiomyomatous uterus. 3. Unchanged cholelithiasis.   12/11/2020 Imaging   EXAM: CT ABDOMEN AND PELVIS WITHOUT CONTRAST  IMPRESSION: 1. Stable inflammatory changes centered around the distal descending colon in the left lower abdomen. Pericolonic inflammatory changes have minimally changed since 11/27/2020. Small pocket of gas medial to the colon is probably associated with a fistula or small residual abscess collection. 2. Stable position of the two percutaneous drains. The more anterior drain may be extending through a portion of the small bowel. 3. Cholelithiasis. 4. Fibroid uterus. Cannot exclude an ovarian/adnexal cystic structure near the uterine fundus. 5. Left adrenal adenoma.   01/07/2021 Imaging   EXAM: CT ABDOMEN AND PELVIS WITH CONTRAST  IMPRESSION: 1. No new abscesses identified. Similar degree of soft tissue thickening seen in the left pericolonic region. The degree of persistent soft tissues thickening in the pericolonic space further raises suspicions for malignancy. Further evaluation with colonoscopy should be performed. 2. Left retroperitoneal abscess drain unchanged in position. Anterior left abdominal drain again seen terminating within  small bowel loop. 3. 2.6 cm mildly sclerotic lesion noted in the L2 vertebral body. Further evaluation with contrast enhanced lumbar spine MRI should be performed.   02/04/2021 Imaging   EXAM: CT ABDOMEN AND PELVIS WITH CONTRAST  IMPRESSION: No new abdominopelvic collections or abscess development in the 1 month interval.   Left anterior drain remains within a loop of small bowel, unchanged.   Left lateral abscess drain remains in the retroperitoneal space adjacent to the iliopsoas muscle with a small amount of residual fluid and air but no measurable collection.   Stable soft tissue prominence and pericolonic strandy edema/inflammation about the left descending colon compatible with residual diverticulitis/colitis. Difficult to exclude underlying transmural lesion.   04/01/2021 Imaging   EXAM: CT ABDOMEN AND PELVIS WITH CONTRAST  IMPRESSION: There appears to be significant enhancement and wall thickening involving the descending colon with some degree of traction and involvement of adjacent small bowel loops. This is consistent with the history of diverticulitis and perforation. Stable position of percutaneous drainage catheter is seen adjacent to left psoas muscle with no significant residual fluid remaining. The other percutaneous drainage catheter that was previously noted to be within small bowel loop on prior exam in the left lower quadrant, has significantly retracted and appears to be outside of the peritoneal space at this time.   Since the prior exam, there does appear to be some degree of rotation involving mesenteric vessels and structures in the right lower quadrant, suggesting partial volvulus or malrotation. There is the interval development of several lymph nodes in this area, most likely inflammatory or reactive in etiology. Mild amount of free fluid is also noted in the pelvis. However, no significant bowel wall thickening or dilatation is seen in this area.  These results will be called to the ordering clinician or representative by the Radiologist Assistant, and communication documented in the PACS or zVision Dashboard.   Stable uterine fibroid.   Hepatic steatosis.   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   Cholelithiasis.   04/10/2021 Imaging   EXAM: CT ANGIOGRAPHY CHEST CT ABDOMEN AND PELVIS WITH CONTRAST  IMPRESSION: 1. Again seen are findings compatible with descending colon diverticulitis with perforation. Free air and inflammation have increased in the interval. 2. New lobulated enhancing fluid collection posterior to this segment of inflamed colon measuring 8.5 x 3.5 x 10.0 cm. This collection now invades the adjacent iliopsoas muscle as well as extends through the left lateral abdominal wall. The tip of the drainage catheter is in this collection. Findings are compatible with abscess. 3. Anterior left percutaneous drainage catheter tip has been pulled back and is now within the subcutaneous tissues. 4. Trace free fluid. 5. No pulmonary embolism.  No acute cardiopulmonary process. 6. Cholelithiasis. 7. Fatty infiltration of the liver.   04/15/2021 Definitive Surgery   FINAL MICROSCOPIC DIAGNOSIS:   A. SMALL BOWEL, RESECTION:  - Adenocarcinoma.  - No carcinoma identified in 1 lymph node.   B. PERFORATED LEFT COLON, RESECTION:  - Moderately differentiated colonic adenocarcinoma.  - Tumor extends into pericolonic adipose tissue, and is strongly  suggestive of invasion into small bowel.  See oncology table/comments.  - No carcinoma identified in 4 lymph nodes (0/4).  - Tubular adenoma with high-grade dysplasia, 1.  - Tubular adenomas with low grade dysplasia, 3.   Comments: The size of the tumor is difficult to estimate secondary to the disrupted nature of the specimen, as well as the infiltrative nature of the tumor.  Tumor can be identified as definitely invading into the pericolonic adipose tissue (block B4), but given  the extreme disruption of the tissue, and the involvement of the small bowel by what appears to be a colonic adenocarcinoma, I favor perforation of the large bowel with direct invasion into the small bowel.  Accordingly, I believe this is best regarded as a pT4b lesion.   ADDENDUM:  Mismatch Repair Protein (IHC)  SUMMARY INTERPRETATION: ABNORMAL  There is loss of the major MMR protein [name]6: This indicates a high probability that a hereditary germline mutation is present and referral to genetic counseling is warranted. It is recommended that the loss of protein expression be correlated with molecular based microsatellite instability testing.   IHC EXPRESSION RESULTS  TEST           RESULT  MLH1:          Preserved nuclear expression  [name]2:          Preserved nuclear expression  [name]6:          LOSS OF NUCLEAR EXPRESSION  PMS2:          Preserved nuclear expression    04/15/2021 Cancer Staging   Staging form: Colon and Rectum, AJCC 8th Edition - Pathologic stage from 04/15/2021: Stage IIC (pT4b, pN0, cM0) - Signed by Malachy Mood, MD on 06/12/2021 Stage prefix: Initial diagnosis Total positive nodes: 0 Histologic grading system: 4 grade system Histologic grade (G): G2 Residual tumor (R): R0 - None   04/29/2021 Imaging   EXAM: CT ABDOMEN AND PELVIS WITH CONTRAST  IMPRESSION: Continued left retroperitoneal abscesses inferior to the left kidney, involving the left psoas muscle and left abdominal wall musculature. Overall size is decreased since prior study. Interval removal of left lower quadrant abscess drainage catheter.  New fluid collection in the cul-de-sac and wrapping around the uterus concerning for abscess.   Cholelithiasis.   Small left pleural effusion.  Bibasilar atelectasis.   Stable left adrenal adenoma.  ADDENDUM: After discussing the case with Dr. Bryn Gulling in interventional radiology, it was noted that the fluid in the pelvis originally thought to be in the cul-de-sac is  likely within the vagina, best seen on sagittal imaging. Recommend speculum exam.   Also, in the left lateral wall in the area of prior abscess in the lateral wall abdominal musculature, some of the abdomen soft tissue appears to be enhancing. While this could be infectious, cannot exclude tumor seeding in the left lateral abdominal wall, measuring 6.9 x 3.8 cm on image 64 of series 2.   06/16/2021 Imaging   EXAM: CT ABDOMEN AND PELVIS WITH CONTRAST  IMPRESSION: Increased size of bulky soft tissue masses involving the left psoas muscle and left lateral abdominal wall soft tissues, consistent with progressive metastatic disease.   Significant progression of multiple peritoneal masses throughout the abdomen and pelvis, consistent with peritoneal carcinomatosis.   New mild retroperitoneal lymphadenopathy, consistent with metastatic disease.   Stable uterine fibroid and left adrenal adenoma.   Cholelithiasis, without evidence of cholecystitis.   06/22/2021 - 08/19/2021 Chemotherapy   Patient is on Treatment Plan : COLORECTAL FOLFOX q14d x 6 months     06/22/2021 Tumor Marker   Patient's tumor was tested for the following markers: CEA. Results of the tumor marker test revealed 87.41.   07/10/2021 Pathology Results   FINAL MICROSCOPIC DIAGNOSIS:   A. PERITONEAL MASS, LEFT UPPER ABDOMINAL QUADRANT, BIOPSY:  -  Metastatic adenocarcinoma with necrosis, histologically consistent with colon primary.     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 The report date is 07/16/2021. The remainder of testing was negative/normal.  The CancerNext-Expanded + RNAinsight gene panel offered by [employer] and includes sequencing and rearrangement analysis for the following 77 genes: IP, ALK, APC*, ATM*, AXIN2, BAP1, BARD1, BLM, BMPR1A, BRCA1*, BRCA2*, BRIP1*, CDC73, CDH1*,CDK4, CDKN1B, CDKN2A, CHEK2*, CTNNA1, DICER1, FANCC, FH, FLCN,  GALNT12, KIF1B, LZTR1, MAX, MEN1, MET, MLH1*, [name]2*, [name]3, [name]6*, MUTYH*, NBN, NF1*, NF2, NTHL1, PALB2*, PHOX2B, PMS2*, POT1, PRKAR1A, PTCH1, PTEN*, RAD51C*, RAD51D*,RB1, RECQL, RET, SDHA, SDHAF2, SDHB, SDHC, SDHD, SMAD4, SMARCA4, SMARCB1, SMARCE1, STK11, SUFU, TMEM127, TP53*,TSC1, TSC2, VHL and XRCC2 (sequencing and deletion/duplication); EGFR, EGLN1, HOXB13, KIT, MITF, PDGFRA, POLD1 and POLE (sequencing only); EPCAM and GREM1 (deletion/duplication only).   08/28/2021 Imaging   EXAM: CT CHEST, ABDOMEN, AND PELVIS WITH CONTRAST  IMPRESSION: 1. Continued interval progression of multiple metastatic soft tissue nodules/masses in the abdomen and pelvis. 2. No evidence for metastatic disease in the chest. 3. Similar appearance of the subtle lesion in the L2 vertebral body, suspicious for metastatic involvement. 4. Cholelithiasis.   09/03/2021 -  Chemotherapy   Patient is on Treatment Plan : COLORECTAL Pembrolizumab (200) q21d     09/08/2021 - 02/01/2022 Chemotherapy   Patient is on Treatment Plan : COLORECTAL Pembrolizumab (200) q21d     01/08/2022 Imaging   CT CAP IMPRESSION: 1. Mixed response to therapy. Interval decrease in size of the conglomerate multilobulated mass extending from the left psoas into the left posterior pararenal space through the left lateral abdominal wall along the tract of the previously placed percutaneous drainage catheter. Interval decrease in size of right adnexal mass and omental masses within the right lower quadrant of the abdomen. Interval development of peritoneal carcinomatosis  with omental caking within the left lower quadrant of the abdomen and left subdiaphragmatic region adjacent to the spleen and along the left pericolic gutter as well as development of ascites appearing loculated within the anterior peritoneum. 2. Interval development of mild left hydronephrosis secondary to stricturing of the mid left ureter. 3. Stable left adrenal metastasis. 4. Stable  sclerotic lesion within the L2 vertebral body. No new lytic or blastic bone lesions are seen. 5. Cholelithiasis. 6. No evidence of intrathoracic metastatic disease. 7. 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 Moderate colonic stool burden. No evidence of obstruction.   03/15/2022 Imaging   IMPRESSION: 1. Status post sigmoid colon resection with left lower quadrant end colostomy. 2. Multicystic bilateral ovarian lesions are slightly increased in size. 3. Diminished, small volume of loculated appearing ascites throughout the abdomen and pelvis, with similar appearance of peritoneal thickening and nodularity throughout. 4. Heterogeneously calcified, conglomerate masses in the left retroperitoneum and abdominal wall are slightly diminished in size. 5. Left adrenal lesion not significantly changed. 6. Unchanged faintly sclerotic metastatic lesion of L2. 7. Constellation of findings is consistent with mixed response to treatment however with overall little interval change. 8. Minimal residual left hydronephrosis and proximal hydroureter, improved compared to prior examination. 9. Cholelithiasis. 10. Coronary artery disease, significantly advanced for patient age.   08/04/2022 Imaging    IMPRESSION: CT CHEST:   1. No developing mass lesion, fluid collection or lymph node enlargement in the thorax. Stable tiny 3 mm lung nodule. 2. Coronary artery calcifications. Please correlate for other coronary risk factors. 3. Chest port   CT ABDOMEN AND PELVIS:   1. Extensive surgical changes identified. Left lower quadrant ostomy, diverting colostomy. Surgical changes along loops of small bowel in the right hemiabdomen. 2. Once again there are areas of nodular tissue extending along the left lateral abdominal wall, into the retroperitoneum which are similar to previous. The amount of adjacent fluid in these locations has  improved. 3. No new mass lesion, fluid collection or lymph node enlargement in the abdomen or pelvis. 4. Gallstones. 5. Enlarged uterus with fibroids. Bilateral complex cystic areas are once again seen and based on appearance differential includes dilated fallopian tubes, hydrosalpinx versus true cystic lesions. Please correlate for any known history or prior workup 6. 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 No developing mass lesion, fluid collection or lymph node enlargement in the thorax. Stable tiny 3 mm lung nodule. 2. Coronary artery calcifications. Please correlate for other coronary risk factors. 3. Chest port   CT ABDOMEN AND PELVIS:   1. Extensive surgical changes identified. Left lower quadrant ostomy, diverting colostomy. Surgical changes along loops of small bowel in the right hemiabdomen. 2. Once again there are areas of nodular tissue extending along the left lateral abdominal wall, into the retroperitoneum which are similar to previous. The amount of adjacent fluid in these locations has improved. 3. No new mass lesion, fluid collection or lymph node enlargement in the abdomen or pelvis. 4. Gallstones. 5. Enlarged uterus with fibroids. Bilateral complex cystic areas are once again seen and based on appearance differential includes  dilated fallopian tubes, hydrosalpinx versus true cystic lesions. Please correlate for any known history or prior workup 6. Limited evaluation for solid organ pathology metastatic disease without the advantage of IV contrast        INTERVAL HISTORY:  Mikayla Rios is here for a follow up of metastatic colon  cancer. She was last seen by NP Lacie on 09/01/2022. She presents to the clinic accompanied by husband. Pt state that she seen her dr and was prescribe Lyrica and trazodone. Pt state that her appetite is good. Pt sate the energy is level is increasing. Pt state that she does exercise she was going to the gym. Pt state that she has to take breaks but she is getting there.   All other systems were reviewed with the patient and are negative.  MEDICAL HISTORY:  Past Medical History:  Diagnosis Date   Allergy    seasonal   Anemia    Blood infection 1985   Blood transfusion without reported diagnosis    colon ca 04/2021   Diverticulitis    Family history of breast cancer    Family history of colon cancer    Family history of colon cancer    Family history of stomach cancer    Hypertension    Obesity    Sleep apnea     SURGICAL HISTORY: Past Surgical History:  Procedure Laterality Date   COLECTOMY WITH COLOSTOMY CREATION/HARTMANN PROCEDURE N/A 04/15/2021   Procedure: COLOSTOMY CREATION/HARTMANN PROCEDURE;  Surgeon: [name], MD;  Location: [facility];  Service: General;  Laterality: N/A;   IR CATHETER TUBE CHANGE  12/12/2020   IR CATHETER TUBE CHANGE  01/27/2021   IR CATHETER TUBE CHANGE  02/19/2021   IR CATHETER TUBE CHANGE  04/13/2021   IR IMAGING GUIDED PORT INSERTION  10/02/2021   IR RADIOLOGIST EVAL & MGMT  11/27/2020   IR RADIOLOGIST EVAL & MGMT  12/11/2020   IR RADIOLOGIST EVAL & MGMT  01/07/2021   IR RADIOLOGIST EVAL & MGMT  02/04/2021   IR SINUS/FIST TUBE CHK-NON GI  12/12/2020   IR SINUS/FIST TUBE CHK-NON GI  02/19/2021   LAPAROSCOPIC PARTIAL COLECTOMY N/A 04/15/2021   Procedure: LAPAROSCOPIC ASSISTED HARTMANN RESECTION;  Surgeon: [name], MD;  Location: [facility];  Service: General;  Laterality: N/A;    I have reviewed the social history and family history with the patient and they are unchanged from previous note.  ALLERGIES:  is allergic to chlorhexidine gluconate,  shellfish allergy, and penicillins.  MEDICATIONS:  Current Outpatient Medications  Medication Sig Dispense Refill   ALPRAZolam (XANAX) 0.25 MG tablet Take 1 tablet (0.25 mg total) by mouth at bedtime as needed for anxiety. 30 tablet 0   amLODipine (NORVASC) 10 MG tablet TAKE 1 TABLET BY MOUTH EVERY DAY 90 tablet 3   HYDROcodone-Acetaminophen 5-300 MG TABS Take 1 tablet every 6 hours by oral route.     levonorgestrel (MIRENA, 52 MG,) 20 MCG/DAY IUD Take 1 device by intrauterine route.     lidocaine-prilocaine (EMLA) cream Apply 1 Application topically as needed. 30 g 3   metoprolol tartrate 75 MG TABS Take 75 mg by mouth 2 (two) times daily. 90 tablet 3   oxyCODONE-acetaminophen (PERCOCET) 7.5-325 MG tablet Take 1 tablet by mouth every 8 (eight) hours as needed for severe pain. 30 tablet 0   POTASSIUM PO Take by mouth.     pregabalin (LYRICA) 75 MG capsule Take 1 capsule (75 mg total) by mouth at bedtime. X 1 week,  then 75 mg BID x 1 week, then can increase to 75 in AM and 150 mg QHS- nerve pain 90 capsule 5   sulfamethoxazole-trimethoprim (BACTRIM DS) 800-160 MG tablet TAKE 1 TABLET(S) EVERY 12 HOURS BY ORAL ROUTE FOR 3 DAYS.     traZODone (DESYREL) 50 MG tablet Take 1-2 tablets (50-100 mg total) by mouth at bedtime as needed for sleep. 60 tablet 5   No current facility-administered medications for this visit.   Facility-Administered Medications Ordered in Other Visits  Medication Dose Route Frequency Provider Last Rate Last Admin   fentaNYL (SUBLIMAZE) injection   Intravenous PRN Mir, Al Corpus, MD   50 mcg at 10/02/21 1436   midazolam (VERSED) injection   Intravenous PRN Mir, Al Corpus, MD   1 mg at 10/02/21 1436   sodium chloride flush (NS) 0.9 % injection 10 mL  10 mL Intracatheter PRN Malachy Mood, MD   10 mL at 09/22/22 1415    PHYSICAL EXAMINATION: ECOG PERFORMANCE STATUS: 1 - Symptomatic but completely ambulatory  Vitals:   09/22/22 1224  BP: (!) 143/88  Pulse: 78  Resp: 16   Temp: 99.3 F (37.4 C)  SpO2: 100%   Wt Readings from Last 3 Encounters:  09/22/22 268 lb 1.6 oz (121.6 kg)  09/20/22 267 lb (121.1 kg)  08/11/22 245 lb 9.6 oz (111.4 kg)    GENERAL:alert, no distress and comfortable SKIN: skin color, texture, turgor are normal, no rashes or significant lesions EYES: normal, Conjunctiva are pink and non-injected, sclera clear LUNGS: (-) clear to auscultation and percussion with normal breathing effort  LABORATORY DATA:  I have reviewed the data as listed    Latest Ref Rng & Units 09/22/2022   12:04 PM 09/01/2022    7:45 AM 08/11/2022    9:38 AM  CBC  WBC 4.0 - 10.5 K/uL 7.3  6.1  4.5   Hemoglobin 12.0 - 15.0 g/dL 16.1  09.6  04.5   Hematocrit 36.0 - 46.0 % 35.4  34.0  33.7   Platelets 150 - 400 K/uL 325  343  365         Latest Ref Rng & Units 09/22/2022   12:04 PM 09/01/2022    7:45 AM 08/11/2022    9:38 AM  CMP  Glucose 70 - 99 mg/dL 94  92  95   BUN 6 - 20 mg/dL 26  16  12    Creatinine 0.44 - 1.00 mg/dL 4.09  8.11  9.14   Sodium 135 - 145 mmol/L 137  138  138   Potassium 3.5 - 5.1 mmol/L 4.1  3.8  3.9   Chloride 98 - 111 mmol/L 107  109  109   CO2 22 - 32 mmol/L 26  22  23    Calcium 8.9 - 10.3 mg/dL 8.9  8.5  8.7   Total Protein 6.5 - 8.1 g/dL 7.3  7.2  7.3   Total Bilirubin 0.3 - 1.2 mg/dL 0.2  0.3  0.4   Alkaline Phos 38 - 126 U/L 52  53  59   AST 15 - 41 U/L 12  13  15    ALT 0 - 44 U/L 10  12  11        RADIOGRAPHIC STUDIES: I have personally reviewed the radiological images as listed and agreed with the findings in the report. No results found.    No orders of the defined types were placed in this encounter.  All questions were answered. The patient knows to call the  clinic with any problems, questions or concerns. No barriers to learning was detected. The total time spent in the appointment was 25 minutes.     Malachy Mood, MD 09/22/2022   Carolin Coy, CMA, am acting as scribe for Malachy Mood, MD.   I have reviewed  the above documentation for accuracy and completeness, and I agree with the above.

## 2022-09-22 ENCOUNTER — Inpatient Hospital Stay (HOSPITAL_BASED_OUTPATIENT_CLINIC_OR_DEPARTMENT_OTHER): Payer: Managed Care, Other (non HMO) | Admitting: Nurse Practitioner

## 2022-09-22 ENCOUNTER — Other Ambulatory Visit (HOSPITAL_COMMUNITY): Payer: Self-pay

## 2022-09-22 ENCOUNTER — Encounter: Payer: Self-pay | Admitting: Nurse Practitioner

## 2022-09-22 ENCOUNTER — Encounter: Payer: Self-pay | Admitting: Hematology

## 2022-09-22 ENCOUNTER — Inpatient Hospital Stay: Payer: Managed Care, Other (non HMO)

## 2022-09-22 ENCOUNTER — Inpatient Hospital Stay: Payer: Managed Care, Other (non HMO) | Attending: Hematology | Admitting: Hematology

## 2022-09-22 VITALS — BP 143/88 | HR 78 | Temp 99.3°F | Resp 16 | Ht 63.0 in | Wt 268.1 lb

## 2022-09-22 VITALS — BP 145/89 | HR 74 | Resp 16

## 2022-09-22 DIAGNOSIS — D3502 Benign neoplasm of left adrenal gland: Secondary | ICD-10-CM | POA: Insufficient documentation

## 2022-09-22 DIAGNOSIS — C786 Secondary malignant neoplasm of retroperitoneum and peritoneum: Secondary | ICD-10-CM

## 2022-09-22 DIAGNOSIS — Z5112 Encounter for antineoplastic immunotherapy: Secondary | ICD-10-CM | POA: Diagnosis not present

## 2022-09-22 DIAGNOSIS — C7972 Secondary malignant neoplasm of left adrenal gland: Secondary | ICD-10-CM | POA: Diagnosis not present

## 2022-09-22 DIAGNOSIS — Z452 Encounter for adjustment and management of vascular access device: Secondary | ICD-10-CM

## 2022-09-22 DIAGNOSIS — K760 Fatty (change of) liver, not elsewhere classified: Secondary | ICD-10-CM | POA: Diagnosis not present

## 2022-09-22 DIAGNOSIS — C187 Malignant neoplasm of sigmoid colon: Secondary | ICD-10-CM

## 2022-09-22 DIAGNOSIS — R911 Solitary pulmonary nodule: Secondary | ICD-10-CM | POA: Diagnosis not present

## 2022-09-22 DIAGNOSIS — Z800 Family history of malignant neoplasm of digestive organs: Secondary | ICD-10-CM | POA: Insufficient documentation

## 2022-09-22 DIAGNOSIS — J90 Pleural effusion, not elsewhere classified: Secondary | ICD-10-CM | POA: Diagnosis not present

## 2022-09-22 DIAGNOSIS — C7951 Secondary malignant neoplasm of bone: Secondary | ICD-10-CM | POA: Insufficient documentation

## 2022-09-22 DIAGNOSIS — Z1501 Genetic susceptibility to malignant neoplasm of breast: Secondary | ICD-10-CM | POA: Diagnosis not present

## 2022-09-22 DIAGNOSIS — M2550 Pain in unspecified joint: Secondary | ICD-10-CM | POA: Insufficient documentation

## 2022-09-22 DIAGNOSIS — K6819 Other retroperitoneal abscess: Secondary | ICD-10-CM | POA: Diagnosis not present

## 2022-09-22 DIAGNOSIS — Z79899 Other long term (current) drug therapy: Secondary | ICD-10-CM | POA: Diagnosis not present

## 2022-09-22 DIAGNOSIS — D1391 Familial adenomatous polyposis: Secondary | ICD-10-CM

## 2022-09-22 DIAGNOSIS — D259 Leiomyoma of uterus, unspecified: Secondary | ICD-10-CM | POA: Diagnosis not present

## 2022-09-22 DIAGNOSIS — K8020 Calculus of gallbladder without cholecystitis without obstruction: Secondary | ICD-10-CM | POA: Insufficient documentation

## 2022-09-22 DIAGNOSIS — Z515 Encounter for palliative care: Secondary | ICD-10-CM | POA: Diagnosis not present

## 2022-09-22 DIAGNOSIS — I2510 Atherosclerotic heart disease of native coronary artery without angina pectoris: Secondary | ICD-10-CM | POA: Insufficient documentation

## 2022-09-22 LAB — CBC WITH DIFFERENTIAL (CANCER CENTER ONLY)
Abs Immature Granulocytes: 0.04 10*3/uL (ref 0.00–0.07)
Basophils Absolute: 0 10*3/uL (ref 0.0–0.1)
Basophils Relative: 0 %
Eosinophils Absolute: 0.1 10*3/uL (ref 0.0–0.5)
Eosinophils Relative: 1 %
HCT: 35.4 % — ABNORMAL LOW (ref 36.0–46.0)
Hemoglobin: 11.7 g/dL — ABNORMAL LOW (ref 12.0–15.0)
Immature Granulocytes: 1 %
Lymphocytes Relative: 26 %
Lymphs Abs: 1.9 10*3/uL (ref 0.7–4.0)
MCH: 28.5 pg (ref 26.0–34.0)
MCHC: 33.1 g/dL (ref 30.0–36.0)
MCV: 86.1 fL (ref 80.0–100.0)
Monocytes Absolute: 0.4 10*3/uL (ref 0.1–1.0)
Monocytes Relative: 5 %
Neutro Abs: 4.8 10*3/uL (ref 1.7–7.7)
Neutrophils Relative %: 67 %
Platelet Count: 325 10*3/uL (ref 150–400)
RBC: 4.11 MIL/uL (ref 3.87–5.11)
RDW: 14.4 % (ref 11.5–15.5)
WBC Count: 7.3 10*3/uL (ref 4.0–10.5)
nRBC: 0 % (ref 0.0–0.2)

## 2022-09-22 LAB — CMP (CANCER CENTER ONLY)
ALT: 10 U/L (ref 0–44)
AST: 12 U/L — ABNORMAL LOW (ref 15–41)
Albumin: 3.9 g/dL (ref 3.5–5.0)
Alkaline Phosphatase: 52 U/L (ref 38–126)
Anion gap: 4 — ABNORMAL LOW (ref 5–15)
BUN: 26 mg/dL — ABNORMAL HIGH (ref 6–20)
CO2: 26 mmol/L (ref 22–32)
Calcium: 8.9 mg/dL (ref 8.9–10.3)
Chloride: 107 mmol/L (ref 98–111)
Creatinine: 0.7 mg/dL (ref 0.44–1.00)
GFR, Estimated: >60 mL/min (ref >60)
Glucose, Bld: 94 mg/dL (ref 70–99)
Potassium: 4.1 mmol/L (ref 3.5–5.1)
Sodium: 137 mmol/L (ref 135–145)
Total Bilirubin: 0.2 mg/dL — ABNORMAL LOW (ref 0.3–1.2)
Total Protein: 7.3 g/dL (ref 6.5–8.1)

## 2022-09-22 LAB — TSH: TSH: 1.272 u[IU]/mL (ref 0.350–4.500)

## 2022-09-22 MED ORDER — HEPARIN SOD (PORK) LOCK FLUSH 100 UNIT/ML IV SOLN
500.0000 [IU] | Freq: Once | INTRAVENOUS | Status: AC | PRN
  Administered 2022-09-22: 500 [IU]

## 2022-09-22 MED ORDER — SODIUM CHLORIDE 0.9% FLUSH
10.0000 mL | INTRAVENOUS | Status: DC | PRN
  Administered 2022-09-22: 10 mL

## 2022-09-22 MED ORDER — SODIUM CHLORIDE 0.9% FLUSH
10.0000 mL | Freq: Once | INTRAVENOUS | Status: AC
  Administered 2022-09-22: 10 mL

## 2022-09-22 MED ORDER — LIDOCAINE-PRILOCAINE 2.5-2.5 % EX CREA
1.0000 | TOPICAL_CREAM | CUTANEOUS | 3 refills | Status: DC | PRN
  Filled 2022-09-22: qty 30, 30d supply, fill #0

## 2022-09-22 MED ORDER — SODIUM CHLORIDE 0.9 % IV SOLN
200.0000 mg | Freq: Once | INTRAVENOUS | Status: AC
  Administered 2022-09-22: 200 mg via INTRAVENOUS
  Filled 2022-09-22: qty 200

## 2022-09-22 MED ORDER — SODIUM CHLORIDE 0.9 % IV SOLN: Freq: Once | INTRAVENOUS | Status: AC

## 2022-09-22 NOTE — Patient Instructions (Signed)
[facility]  Discharge Instructions: Thank you for choosing [facility] to provide your oncology and hematology care.   If you have a lab appointment with the Cancer Center, please go directly to the Cancer Center and check in at the registration area.   Wear comfortable clothing and clothing appropriate for easy access to any Portacath or PICC line.   We strive to give you quality time with your provider. You may need to reschedule your appointment if you arrive late (15 or more minutes).  Arriving late affects you and other patients whose appointments are after yours.  Also, if you miss three or more appointments without notifying the office, you may be dismissed from the clinic at the provider's discretion.      For prescription refill requests, have your pharmacy contact our office and allow 72 hours for refills to be completed.    Today you received the following chemotherapy and/or immunotherapy agents: Keytruda      To help prevent nausea and vomiting after your treatment, we encourage you to take your nausea medication as directed.  BELOW ARE SYMPTOMS THAT SHOULD BE REPORTED IMMEDIATELY: *FEVER GREATER THAN 100.4 F (38 C) OR HIGHER *CHILLS OR SWEATING *NAUSEA AND VOMITING THAT IS NOT CONTROLLED WITH YOUR NAUSEA MEDICATION *UNUSUAL SHORTNESS OF BREATH *UNUSUAL BRUISING OR BLEEDING *URINARY PROBLEMS (pain or burning when urinating, or frequent urination) *BOWEL PROBLEMS (unusual diarrhea, constipation, pain near the anus) TENDERNESS IN MOUTH AND THROAT WITH OR WITHOUT PRESENCE OF ULCERS (sore throat, sores in mouth, or a toothache) UNUSUAL RASH, SWELLING OR PAIN  UNUSUAL VAGINAL DISCHARGE OR ITCHING   Items with * indicate a potential emergency and should be followed up as soon as possible or go to the Emergency Department if any problems should occur.  Please show the CHEMOTHERAPY ALERT CARD or IMMUNOTHERAPY ALERT CARD at  check-in to the Emergency Department and triage nurse.  Should you have questions after your visit or need to cancel or reschedule your appointment, please contact [facility]  Dept: [phone]  and follow the prompts.  Office hours are 8:00 a.m. to 4:30 p.m. Monday - Friday. Please note that voicemails left after 4:00 p.m. may not be returned until the following business day.  We are closed weekends and major holidays. You have access to a nurse at all times for urgent questions. Please call the main number to the clinic Dept: [phone] and follow the prompts.   For any non-urgent questions, you may also contact your provider using MyChart. We now offer e-Visits for anyone 18 and older to request care online for non-urgent symptoms. For details visit [email or web address].   Also download the MyChart app! Go to the app store, search "MyChart", open the app, select , and log in with your MyChart username and password.   

## 2022-09-24 LAB — T4: T4, Total: 6.8 ug/dL (ref 4.5–12.0)

## 2022-10-01 ENCOUNTER — Other Ambulatory Visit (HOSPITAL_COMMUNITY): Payer: Self-pay

## 2022-10-12 ENCOUNTER — Other Ambulatory Visit: Payer: Self-pay | Admitting: Physical Medicine and Rehabilitation

## 2022-10-12 NOTE — Progress Notes (Unsigned)
[facility]   [phone] Fax:([phone]   Clinic Follow up Note   Patient Care Team: [name], MD as PCP - General (Internal Medicine) [name], MD as PCP - Cardiology (Cardiology) [name], [name] as Physician Assistant (Emergency Medicine) Malachy Mood, MD as Consulting Physician (Oncology) [name], MD as Consulting Physician (General Surgery) [name], [name], MD as Consulting Physician (Neurology) [name], [name], MD as Consulting Physician (Obstetrics and Gynecology) [name], MD as Consulting Physician (Gastroenterology) [name], [name], NP as Nurse Practitioner (Nurse Practitioner)  Date of Service:  10/12/2022  CHIEF COMPLAINT: f/u of  metastatic colon cancer     CURRENT THERAPY:  Pembrolizumab q21d   ASSESSMENT: *** Mikayla Rios is a 37 y.o. female with   No problem-specific Assessment & Plan notes found for this encounter.  ***   PLAN:       SUMMARY OF ONCOLOGIC HISTORY: Oncology History Overview Note   Cancer Staging  Malignant neoplasm of sigmoid colon [facility]) Staging form: Colon and Rectum, AJCC 8th Edition - Pathologic stage from 04/15/2021: Stage IIC (pT4b, pN0, cM0) - Signed by Malachy Mood, MD on 06/12/2021    Malignant neoplasm of sigmoid colon [facility])   Initial Diagnosis   Malignant neoplasm of sigmoid colon (HCC)   11/04/2020 Imaging   EXAM: CT ABDOMEN AND PELVIS WITHOUT CONTRAST  IMPRESSION: 1. Perforating descending colonic diverticulitis with multiple abdominopelvic gas and fluid collections, measuring up to 5.7 cm and further described above. 2. Cholelithiasis without findings of acute cholecystitis. 3. 3.6 cm benign left adrenal adenoma. 4. Leiomyomatous uterus. 5. 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   11/07/2020 Imaging   EXAM: CT ABDOMEN AND  PELVIS WITHOUT CONTRAST  IMPRESSION: Continued wall thickening is seen involving descending colon suggesting infectious or inflammatory colitis or perforated diverticulitis. There is an adjacent fluid collection measuring 7.0 x 5.0 cm consistent with abscess which is significantly enlarged compared to prior exam. Adjacent to the abscess, there is a severely thickened small bowel loop most consistent with secondary inflammation.   5.2 x 3.5 cm fluid collection is noted within the left psoas muscle consistent with abscess which is significantly enlarged compared to prior exam. 4.4 x 3.8 cm fluid collection consistent with abscess is noted in the left retroperitoneal region which is also enlarged compared to prior exam.   Fibroid uterus.   Cholelithiasis.   11/27/2020 Imaging   EXAM: CT ABDOMEN AND PELVIS WITHOUT CONTRAST  IMPRESSION: 1. Significantly interval decreased size of the previously visualized left retroperitoneal and left anterior mid abdominal fluid collections. There is persistent fat stranding and mural thickening of the mid descending colon at this level. 2. Unchanged leiomyomatous uterus. 3. Unchanged cholelithiasis.   12/11/2020 Imaging   EXAM: CT ABDOMEN AND PELVIS WITHOUT CONTRAST  IMPRESSION: 1. Stable inflammatory changes centered around the distal descending colon in the left lower abdomen. Pericolonic inflammatory changes have minimally changed since 11/27/2020. Small pocket of gas medial to the colon is probably associated with a fistula or small residual abscess collection. 2. Stable position of the two percutaneous drains. The more anterior drain may be extending through a portion of the small bowel. 3. Cholelithiasis. 4. Fibroid uterus. Cannot exclude an ovarian/adnexal cystic structure near the uterine fundus. 5. Left adrenal adenoma.   01/07/2021 Imaging   EXAM: CT ABDOMEN AND PELVIS WITH CONTRAST  IMPRESSION: 1. No new abscesses identified. Similar  degree of soft tissue thickening seen in the left pericolonic region. The degree of persistent soft tissues thickening in the pericolonic space further raises suspicions for malignancy. Further evaluation with colonoscopy should be performed. 2. Left retroperitoneal abscess drain unchanged in position. Anterior left abdominal drain again seen terminating within small bowel loop. 3. 2.6 cm mildly sclerotic lesion noted in the L2 vertebral body. Further evaluation with contrast enhanced lumbar spine MRI should be performed.   02/04/2021 Imaging   EXAM: CT ABDOMEN AND PELVIS WITH CONTRAST  IMPRESSION: No new abdominopelvic collections or abscess development in the 1 month interval.   Left anterior drain remains within a loop of small bowel, unchanged.   Left lateral abscess drain remains in the retroperitoneal space adjacent to the iliopsoas muscle with a small amount of residual fluid and air but no measurable collection.   Stable soft tissue prominence and pericolonic strandy edema/inflammation about the left descending colon compatible with residual diverticulitis/colitis. Difficult to exclude underlying transmural lesion.   04/01/2021 Imaging   EXAM: CT ABDOMEN AND PELVIS WITH CONTRAST  IMPRESSION: There appears to be significant enhancement and wall thickening involving the descending colon with some degree of traction and involvement of adjacent small bowel loops. This is consistent with the history of diverticulitis and perforation. Stable position of percutaneous drainage catheter is seen adjacent to left psoas muscle with no significant residual fluid remaining. The other percutaneous drainage catheter that was previously noted to be within small bowel loop on prior exam in the left lower quadrant, has significantly retracted and appears to be outside of the peritoneal space at this time.   Since the prior exam, there does appear to be some degree of rotation involving  mesenteric vessels and structures in the right lower quadrant, suggesting partial volvulus or malrotation. There is the interval development of several lymph nodes in this area, most likely inflammatory or reactive in etiology. Mild amount of free fluid is also noted in the pelvis. However, no significant bowel wall thickening or dilatation is seen in this area. These results will be called to the ordering clinician or representative by the Radiologist Assistant, and communication documented in the PACS or zVision Dashboard.   Stable uterine fibroid.   Hepatic steatosis.   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   Cholelithiasis.   04/10/2021 Imaging   EXAM: CT ANGIOGRAPHY CHEST CT ABDOMEN AND PELVIS WITH CONTRAST  IMPRESSION: 1. Again seen are findings compatible with descending colon diverticulitis with perforation. Free air and inflammation have increased in the interval. 2. New lobulated enhancing fluid collection posterior to this segment of inflamed colon measuring 8.5 x 3.5 x 10.0 cm. This collection now invades the adjacent iliopsoas muscle as well as extends through the left lateral abdominal wall. The tip of the drainage catheter is in this collection. Findings are compatible with abscess. 3. Anterior left percutaneous drainage catheter tip has been pulled back and is now within the subcutaneous tissues. 4. Trace free fluid. 5. No pulmonary embolism.  No acute cardiopulmonary process. 6. Cholelithiasis. 7. Fatty infiltration of the liver.   04/15/2021 Definitive Surgery   FINAL MICROSCOPIC DIAGNOSIS:   A. SMALL BOWEL, RESECTION:  - Adenocarcinoma.  - No carcinoma identified in 1 lymph node.   B. PERFORATED LEFT COLON, RESECTION:  - Moderately differentiated colonic adenocarcinoma.  - Tumor extends into pericolonic adipose tissue, and is strongly  suggestive of invasion into small bowel.  See oncology table/comments.  - No carcinoma identified in 4 lymph nodes  (0/4).  -  Tubular adenoma with high-grade dysplasia, 1.  - Tubular adenomas with low grade dysplasia, 3.   Comments: The size of the tumor is difficult to estimate secondary to the disrupted nature of the specimen, as well as the infiltrative nature of the tumor.  Tumor can be identified as definitely invading into the pericolonic adipose tissue (block B4), but given the extreme disruption of the tissue, and the involvement of the small bowel by what appears to be a colonic adenocarcinoma, I favor perforation of the large bowel with direct invasion into the small bowel.  Accordingly, I believe this is best regarded as a pT4b lesion.   ADDENDUM:  Mismatch Repair Protein (IHC)  SUMMARY INTERPRETATION: ABNORMAL  There is loss of the major MMR protein [name]6: This indicates a high probability that a hereditary germline mutation is present and referral to genetic counseling is warranted. It is recommended that the loss of protein expression be correlated with molecular based microsatellite instability testing.   IHC EXPRESSION RESULTS  TEST           RESULT  MLH1:          Preserved nuclear expression  [name]2:          Preserved nuclear expression  [name]6:          LOSS OF NUCLEAR EXPRESSION  PMS2:          Preserved nuclear expression    04/15/2021 Cancer Staging   Staging form: Colon and Rectum, AJCC 8th Edition - Pathologic stage from 04/15/2021: Stage IIC (pT4b, pN0, cM0) - Signed by Malachy Mood, MD on 06/12/2021 Stage prefix: Initial diagnosis Total positive nodes: 0 Histologic grading system: 4 grade system Histologic grade (G): G2 Residual tumor (R): R0 - None   04/29/2021 Imaging   EXAM: CT ABDOMEN AND PELVIS WITH CONTRAST  IMPRESSION: Continued left retroperitoneal abscesses inferior to the left kidney, involving the left psoas muscle and left abdominal wall musculature. Overall size is decreased since prior study. Interval removal of left lower quadrant abscess drainage catheter.   New  fluid collection in the cul-de-sac and wrapping around the uterus concerning for abscess.   Cholelithiasis.   Small left pleural effusion.  Bibasilar atelectasis.   Stable left adrenal adenoma.  ADDENDUM: After discussing the case with Dr. Bryn Gulling in interventional radiology, it was noted that the fluid in the pelvis originally thought to be in the cul-de-sac is likely within the vagina, best seen on sagittal imaging. Recommend speculum exam.   Also, in the left lateral wall in the area of prior abscess in the lateral wall abdominal musculature, some of the abdomen soft tissue appears to be enhancing. While this could be infectious, cannot exclude tumor seeding in the left lateral abdominal wall, measuring 6.9 x 3.8 cm on image 64 of series 2.   06/16/2021 Imaging   EXAM: CT ABDOMEN AND PELVIS WITH CONTRAST  IMPRESSION: Increased size of bulky soft tissue masses involving the left psoas muscle and left lateral abdominal wall soft tissues, consistent with progressive metastatic disease.   Significant progression of multiple peritoneal masses throughout the abdomen and pelvis, consistent with peritoneal carcinomatosis.   New mild retroperitoneal lymphadenopathy, consistent with metastatic disease.   Stable uterine fibroid and left adrenal adenoma.   Cholelithiasis, without evidence of cholecystitis.   06/22/2021 - 08/19/2021 Chemotherapy   Patient is on Treatment Plan : COLORECTAL FOLFOX q14d x 6 months     06/22/2021 Tumor Marker   Patient's tumor was tested for the following markers:  CEA. Results of the tumor marker test revealed 87.41.   07/10/2021 Pathology Results   FINAL MICROSCOPIC DIAGNOSIS:   A. PERITONEAL MASS, LEFT UPPER ABDOMINAL QUADRANT, BIOPSY:  -  Metastatic adenocarcinoma with necrosis, histologically consistent with colon primary.     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 The report  date is 07/16/2021. The remainder of testing was negative/normal.  The CancerNext-Expanded + RNAinsight gene panel offered by [employer] and includes sequencing and rearrangement analysis for the following 77 genes: IP, ALK, APC*, ATM*, AXIN2, BAP1, BARD1, BLM, BMPR1A, BRCA1*, BRCA2*, BRIP1*, CDC73, CDH1*,CDK4, CDKN1B, CDKN2A, CHEK2*, CTNNA1, DICER1, FANCC, FH, FLCN, GALNT12, KIF1B, LZTR1, MAX, MEN1, MET, MLH1*, [name]2*, [name]3, [name]6*, MUTYH*, NBN, NF1*, NF2, NTHL1, PALB2*, PHOX2B, PMS2*, POT1, PRKAR1A, PTCH1, PTEN*, RAD51C*, RAD51D*,RB1, RECQL, RET, SDHA, SDHAF2, SDHB, SDHC, SDHD, SMAD4, SMARCA4, SMARCB1, SMARCE1, STK11, SUFU, TMEM127, TP53*,TSC1, TSC2, VHL and XRCC2 (sequencing and deletion/duplication); EGFR, EGLN1, HOXB13, KIT, MITF, PDGFRA, POLD1 and POLE (sequencing only); EPCAM and GREM1 (deletion/duplication only).   08/28/2021 Imaging   EXAM: CT CHEST, ABDOMEN, AND PELVIS WITH CONTRAST  IMPRESSION: 1. Continued interval progression of multiple metastatic soft tissue nodules/masses in the abdomen and pelvis. 2. No evidence for metastatic disease in the chest. 3. Similar appearance of the subtle lesion in the L2 vertebral body, suspicious for metastatic involvement. 4. Cholelithiasis.   09/03/2021 -  Chemotherapy   Patient is on Treatment Plan : COLORECTAL Pembrolizumab (200) q21d     09/08/2021 - 02/01/2022 Chemotherapy   Patient is on Treatment Plan : COLORECTAL Pembrolizumab (200) q21d     01/08/2022 Imaging   CT CAP IMPRESSION: 1. Mixed response to therapy. Interval decrease in size of the conglomerate multilobulated mass extending from the left psoas into the left posterior pararenal space through the left lateral abdominal wall along the tract of the previously placed percutaneous drainage catheter. Interval decrease in size of right adnexal mass and omental masses within the right lower quadrant of the abdomen. Interval development of peritoneal carcinomatosis with omental caking within  the left lower quadrant of the abdomen and left subdiaphragmatic region adjacent to the spleen and along the left pericolic gutter as well as development of ascites appearing loculated within the anterior peritoneum. 2. Interval development of mild left hydronephrosis secondary to stricturing of the mid left ureter. 3. Stable left adrenal metastasis. 4. Stable sclerotic lesion within the L2 vertebral body. No new lytic or blastic bone lesions are seen. 5. Cholelithiasis. 6. No evidence of intrathoracic metastatic disease. 7. 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 Moderate colonic stool burden. No evidence of obstruction.   03/15/2022 Imaging   IMPRESSION: 1. Status post sigmoid colon resection with left lower quadrant end colostomy. 2. Multicystic bilateral ovarian lesions are slightly increased in size. 3. Diminished, small volume of loculated appearing ascites throughout the abdomen and pelvis, with similar appearance of peritoneal thickening and nodularity throughout. 4. Heterogeneously calcified, conglomerate masses in the left retroperitoneum and abdominal wall are slightly diminished in size. 5. Left adrenal lesion not significantly changed. 6. Unchanged faintly sclerotic metastatic lesion of L2. 7. Constellation of findings is consistent with mixed response to treatment however with overall little interval change. 8. Minimal residual left hydronephrosis and proximal hydroureter, improved compared to prior examination. 9. Cholelithiasis. 10. Coronary artery disease, significantly advanced for patient age.   08/04/2022 Imaging    IMPRESSION: CT CHEST:   1. No developing  mass lesion, fluid collection or lymph node enlargement in the thorax. Stable tiny 3 mm lung nodule. 2. Coronary artery calcifications. Please correlate for other coronary risk factors. 3. Chest port   CT ABDOMEN AND PELVIS:   1.  Extensive surgical changes identified. Left lower quadrant ostomy, diverting colostomy. Surgical changes along loops of small bowel in the right hemiabdomen. 2. Once again there are areas of nodular tissue extending along the left lateral abdominal wall, into the retroperitoneum which are similar to previous. The amount of adjacent fluid in these locations has improved. 3. No new mass lesion, fluid collection or lymph node enlargement in the abdomen or pelvis. 4. Gallstones. 5. Enlarged uterus with fibroids. Bilateral complex cystic areas are once again seen and based on appearance differential includes dilated fallopian tubes, hydrosalpinx versus true cystic lesions. Please correlate for any known history or prior workup 6. 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 No developing mass lesion, fluid collection or lymph node enlargement in the thorax. Stable tiny 3 mm lung nodule. 2. Coronary artery calcifications. Please correlate for other coronary risk factors. 3. Chest port   CT ABDOMEN AND PELVIS:   1. Extensive surgical changes identified. Left lower quadrant ostomy, diverting colostomy. Surgical changes along loops of small bowel in the right hemiabdomen. 2. Once again there are areas of nodular tissue extending along the left lateral abdominal wall, into the retroperitoneum which are similar to previous. The amount of adjacent fluid in these locations has improved. 3. No new mass lesion, fluid collection or lymph node enlargement in the abdomen or pelvis. 4. Gallstones. 5. Enlarged uterus with fibroids. Bilateral complex cystic areas are once again  seen and based on appearance differential includes dilated fallopian tubes, hydrosalpinx versus true cystic lesions. Please correlate for any known history or prior workup 6. Limited evaluation for solid organ pathology metastatic disease without the advantage of IV contrast        INTERVAL HISTORY: *** Mikayla Rios is here for a follow up of  metastatic colon cancer . She was last seen by me on 09/22/2022. She presents to the clinic       All other systems were reviewed with the patient and are negative.  MEDICAL HISTORY:  Past Medical History:  Diagnosis Date   Allergy    seasonal   Anemia    Blood infection 1985   Blood transfusion without reported diagnosis    colon ca 04/2021   Diverticulitis    Family history of breast cancer    Family history of colon cancer    Family history of colon cancer    Family history of stomach cancer    Hypertension    Obesity    Sleep apnea     SURGICAL HISTORY: Past Surgical History:  Procedure Laterality Date   COLECTOMY WITH COLOSTOMY CREATION/HARTMANN PROCEDURE N/A 04/15/2021   Procedure: COLOSTOMY CREATION/HARTMANN PROCEDURE;  Surgeon: [name], MD;  Location: [facility];  Service: General;  Laterality: N/A;   IR CATHETER TUBE CHANGE  12/12/2020   IR CATHETER TUBE CHANGE  01/27/2021   IR CATHETER TUBE CHANGE  02/19/2021   IR CATHETER TUBE CHANGE  04/13/2021   IR IMAGING GUIDED PORT INSERTION  10/02/2021   IR RADIOLOGIST EVAL & MGMT  11/27/2020   IR RADIOLOGIST EVAL & MGMT  12/11/2020   IR RADIOLOGIST EVAL & MGMT  01/07/2021   IR RADIOLOGIST EVAL & MGMT  02/04/2021   IR SINUS/FIST TUBE CHK-NON GI  12/12/2020   IR SINUS/FIST TUBE CHK-NON GI  02/19/2021   LAPAROSCOPIC PARTIAL COLECTOMY N/A 04/15/2021   Procedure: LAPAROSCOPIC ASSISTED HARTMANN RESECTION;  Surgeon: [name], MD;  Location: [facility];  Service: General;  Laterality: N/A;    I have reviewed the social history and family history with the patient and they are  unchanged from previous note.  ALLERGIES:  is allergic to chlorhexidine gluconate, shellfish allergy, and penicillins.  MEDICATIONS:  Current Outpatient Medications  Medication Sig Dispense Refill   ALPRAZolam (XANAX) 0.25 MG tablet Take 1 tablet (0.25 mg total) by mouth at bedtime as needed for anxiety. 30 tablet 0   amLODipine (NORVASC) 10 MG tablet TAKE 1 TABLET BY MOUTH EVERY DAY 90 tablet 3   HYDROcodone-Acetaminophen 5-300 MG TABS Take 1 tablet every 6 hours by oral route.     levonorgestrel (MIRENA, 52 MG,) 20 MCG/DAY IUD Take 1 device by intrauterine route.     lidocaine-prilocaine (EMLA) cream Apply 1 Application topically as needed. 30 g 3   metoprolol tartrate 75 MG TABS Take 75 mg by mouth 2 (two) times daily. 90 tablet 3   oxyCODONE-acetaminophen (PERCOCET) 7.5-325 MG tablet Take 1 tablet by mouth every 8 (eight) hours as needed for severe pain. 30 tablet 0   POTASSIUM PO Take by mouth.     pregabalin (LYRICA) 75 MG capsule Take 1 capsule (75 mg total) by mouth at bedtime. X 1 week, then 75 mg BID x 1 week, then can increase to 75 in AM and 150 mg QHS- nerve pain 90 capsule 5   sulfamethoxazole-trimethoprim (BACTRIM DS) 800-160 MG tablet TAKE 1 TABLET(S) EVERY 12 HOURS BY ORAL ROUTE FOR 3 DAYS.     traZODone (DESYREL) 50 MG tablet TAKE 1-2 TABLETS BY MOUTH AT BEDTIME AS NEEDED FOR SLEEP. 180 tablet 1   No current facility-administered medications for this visit.   Facility-Administered Medications Ordered in Other Visits  Medication Dose Route Frequency Provider Last Rate Last Admin   fentaNYL (SUBLIMAZE) injection   Intravenous PRN Mir, Al Corpus, MD   50 mcg at 10/02/21 1436   midazolam (VERSED) injection   Intravenous PRN Mir, Al Corpus, MD   1 mg at 10/02/21 1436    PHYSICAL EXAMINATION: ECOG PERFORMANCE STATUS: {CHL ONC ECOG PS:[id number]}  There were no vitals filed for this visit. Wt Readings from Last 3 Encounters:  09/22/22 268 lb 1.6 oz (121.6 kg)  09/20/22 267  lb (121.1 kg)  08/11/22 245 lb 9.6 oz (111.4 kg)    {Only keep what was examined. If exam not performed, can use .CEXAM } GENERAL:alert, no distress and comfortable SKIN: skin color, texture, turgor are normal, no rashes or significant lesions EYES: normal, Conjunctiva are pink and non-injected, sclera clear {OROPHARYNX:no exudate, no erythema and lips, buccal mucosa, and tongue normal}  NECK: supple, thyroid normal size, non-tender, without nodularity LYMPH:  no palpable lymphadenopathy in the cervical, axillary {or inguinal} LUNGS: clear to auscultation and percussion with normal breathing effort HEART: regular rate & rhythm and no murmurs and no lower extremity edema ABDOMEN:abdomen soft, non-tender and normal bowel sounds Musculoskeletal:no cyanosis of digits and no clubbing  NEURO: alert & oriented x 3  with fluent speech, no focal motor/sensory deficits  LABORATORY DATA:  I have reviewed the data as listed    Latest Ref Rng & Units 09/22/2022   12:04 PM 09/01/2022    7:45 AM 08/11/2022    9:38 AM  CBC  WBC 4.0 - 10.5 K/uL 7.3  6.1  4.5   Hemoglobin 12.0 - 15.0 g/dL 57.8  46.9  62.9   Hematocrit 36.0 - 46.0 % 35.4  34.0  33.7   Platelets 150 - 400 K/uL 325  343  365         Latest Ref Rng & Units 09/22/2022   12:04 PM 09/01/2022    7:45 AM 08/11/2022    9:38 AM  CMP  Glucose 70 - 99 mg/dL 94  92  95   BUN 6 - 20 mg/dL 26  16  12    Creatinine 0.44 - 1.00 mg/dL 5.28  4.13  2.44   Sodium 135 - 145 mmol/L 137  138  138   Potassium 3.5 - 5.1 mmol/L 4.1  3.8  3.9   Chloride 98 - 111 mmol/L 107  109  109   CO2 22 - 32 mmol/L 26  22  23    Calcium 8.9 - 10.3 mg/dL 8.9  8.5  8.7   Total Protein 6.5 - 8.1 g/dL 7.3  7.2  7.3   Total Bilirubin 0.3 - 1.2 mg/dL 0.2  0.3  0.4   Alkaline Phos 38 - 126 U/L 52  53  59   AST 15 - 41 U/L 12  13  15    ALT 0 - 44 U/L 10  12  11        RADIOGRAPHIC STUDIES: I have personally reviewed the radiological images as listed and agreed with the  findings in the report. No results found.    No orders of the defined types were placed in this encounter.  All questions were answered. The patient knows to call the clinic with any problems, questions or concerns. No barriers to learning was detected. The total time spent in the appointment was {CHL ONC TIME VISIT - WNUUV:[id number]}.     Salome Holmes, CMA 10/12/2022   I, Monica Martinez, CMA, am acting as scribe for Malachy Mood, MD.   {Add scribe attestation statement}

## 2022-10-13 ENCOUNTER — Inpatient Hospital Stay: Payer: Managed Care, Other (non HMO) | Attending: Hematology | Admitting: Hematology

## 2022-10-13 ENCOUNTER — Inpatient Hospital Stay: Payer: Managed Care, Other (non HMO)

## 2022-10-13 ENCOUNTER — Encounter: Payer: Self-pay | Admitting: Hematology

## 2022-10-13 VITALS — BP 156/101 | HR 73 | Temp 98.7°F | Resp 18 | Ht 63.0 in | Wt 272.9 lb

## 2022-10-13 VITALS — BP 150/94 | HR 80 | Resp 16

## 2022-10-13 DIAGNOSIS — Z5112 Encounter for antineoplastic immunotherapy: Secondary | ICD-10-CM | POA: Diagnosis not present

## 2022-10-13 DIAGNOSIS — Z79899 Other long term (current) drug therapy: Secondary | ICD-10-CM | POA: Diagnosis not present

## 2022-10-13 DIAGNOSIS — Z1509 Genetic susceptibility to other malignant neoplasm: Secondary | ICD-10-CM | POA: Insufficient documentation

## 2022-10-13 DIAGNOSIS — Z452 Encounter for adjustment and management of vascular access device: Secondary | ICD-10-CM

## 2022-10-13 DIAGNOSIS — D259 Leiomyoma of uterus, unspecified: Secondary | ICD-10-CM | POA: Diagnosis not present

## 2022-10-13 DIAGNOSIS — I2510 Atherosclerotic heart disease of native coronary artery without angina pectoris: Secondary | ICD-10-CM | POA: Diagnosis not present

## 2022-10-13 DIAGNOSIS — C187 Malignant neoplasm of sigmoid colon: Secondary | ICD-10-CM | POA: Insufficient documentation

## 2022-10-13 DIAGNOSIS — K5720 Diverticulitis of large intestine with perforation and abscess without bleeding: Secondary | ICD-10-CM | POA: Insufficient documentation

## 2022-10-13 DIAGNOSIS — D3502 Benign neoplasm of left adrenal gland: Secondary | ICD-10-CM | POA: Diagnosis not present

## 2022-10-13 DIAGNOSIS — R911 Solitary pulmonary nodule: Secondary | ICD-10-CM | POA: Diagnosis not present

## 2022-10-13 DIAGNOSIS — K6819 Other retroperitoneal abscess: Secondary | ICD-10-CM | POA: Insufficient documentation

## 2022-10-13 DIAGNOSIS — C786 Secondary malignant neoplasm of retroperitoneum and peritoneum: Secondary | ICD-10-CM

## 2022-10-13 DIAGNOSIS — M2550 Pain in unspecified joint: Secondary | ICD-10-CM | POA: Insufficient documentation

## 2022-10-13 DIAGNOSIS — N83209 Unspecified ovarian cyst, unspecified side: Secondary | ICD-10-CM | POA: Insufficient documentation

## 2022-10-13 DIAGNOSIS — M25549 Pain in joints of unspecified hand: Secondary | ICD-10-CM | POA: Diagnosis not present

## 2022-10-13 DIAGNOSIS — C7951 Secondary malignant neoplasm of bone: Secondary | ICD-10-CM | POA: Diagnosis not present

## 2022-10-13 DIAGNOSIS — D1391 Familial adenomatous polyposis: Secondary | ICD-10-CM | POA: Diagnosis not present

## 2022-10-13 DIAGNOSIS — K760 Fatty (change of) liver, not elsewhere classified: Secondary | ICD-10-CM | POA: Diagnosis not present

## 2022-10-13 DIAGNOSIS — D649 Anemia, unspecified: Secondary | ICD-10-CM

## 2022-10-13 DIAGNOSIS — K8020 Calculus of gallbladder without cholecystitis without obstruction: Secondary | ICD-10-CM | POA: Insufficient documentation

## 2022-10-13 LAB — CMP (CANCER CENTER ONLY)
ALT: 9 U/L (ref 0–44)
AST: 12 U/L — ABNORMAL LOW (ref 15–41)
Albumin: 3.9 g/dL (ref 3.5–5.0)
Alkaline Phosphatase: 51 U/L (ref 38–126)
Anion gap: 5 (ref 5–15)
BUN: 12 mg/dL (ref 6–20)
CO2: 27 mmol/L (ref 22–32)
Calcium: 8.7 mg/dL — ABNORMAL LOW (ref 8.9–10.3)
Chloride: 106 mmol/L (ref 98–111)
Creatinine: 0.63 mg/dL (ref 0.44–1.00)
GFR, Estimated: >60 mL/min (ref >60)
Glucose, Bld: 94 mg/dL (ref 70–99)
Potassium: 3.8 mmol/L (ref 3.5–5.1)
Sodium: 138 mmol/L (ref 135–145)
Total Bilirubin: 0.3 mg/dL (ref 0.3–1.2)
Total Protein: 7.2 g/dL (ref 6.5–8.1)

## 2022-10-13 LAB — CBC WITH DIFFERENTIAL (CANCER CENTER ONLY)
Abs Immature Granulocytes: 0.02 10*3/uL (ref 0.00–0.07)
Basophils Absolute: 0 10*3/uL (ref 0.0–0.1)
Basophils Relative: 0 %
Eosinophils Absolute: 0.1 10*3/uL (ref 0.0–0.5)
Eosinophils Relative: 1 %
HCT: 34.9 % — ABNORMAL LOW (ref 36.0–46.0)
Hemoglobin: 11.7 g/dL — ABNORMAL LOW (ref 12.0–15.0)
Immature Granulocytes: 0 %
Lymphocytes Relative: 34 %
Lymphs Abs: 1.7 10*3/uL (ref 0.7–4.0)
MCH: 28.5 pg (ref 26.0–34.0)
MCHC: 33.5 g/dL (ref 30.0–36.0)
MCV: 85.1 fL (ref 80.0–100.0)
Monocytes Absolute: 0.3 10*3/uL (ref 0.1–1.0)
Monocytes Relative: 7 %
Neutro Abs: 2.8 10*3/uL (ref 1.7–7.7)
Neutrophils Relative %: 58 %
Platelet Count: 350 10*3/uL (ref 150–400)
RBC: 4.1 MIL/uL (ref 3.87–5.11)
RDW: 13.8 % (ref 11.5–15.5)
WBC Count: 4.8 10*3/uL (ref 4.0–10.5)
nRBC: 0 % (ref 0.0–0.2)

## 2022-10-13 LAB — IRON AND IRON BINDING CAPACITY (CC-WL,HP ONLY)
Iron: 67 ug/dL (ref 28–170)
Saturation Ratios: 20 % (ref 10.4–31.8)
TIBC: 329 ug/dL (ref 250–450)
UIBC: 262 ug/dL (ref 148–442)

## 2022-10-13 LAB — PREGNANCY, URINE: Preg Test, Ur: NEGATIVE

## 2022-10-13 LAB — FERRITIN: Ferritin: 28 ng/mL (ref 11–307)

## 2022-10-13 MED ORDER — SODIUM CHLORIDE 0.9% FLUSH
10.0000 mL | INTRAVENOUS | Status: DC | PRN
  Administered 2022-10-13: 10 mL

## 2022-10-13 MED ORDER — SODIUM CHLORIDE 0.9 % IV SOLN: Freq: Once | INTRAVENOUS | Status: AC

## 2022-10-13 MED ORDER — SODIUM CHLORIDE 0.9% FLUSH
10.0000 mL | Freq: Once | INTRAVENOUS | Status: AC
  Administered 2022-10-13: 10 mL

## 2022-10-13 MED ORDER — HEPARIN SOD (PORK) LOCK FLUSH 100 UNIT/ML IV SOLN
500.0000 [IU] | Freq: Once | INTRAVENOUS | Status: AC | PRN
  Administered 2022-10-13: 500 [IU]

## 2022-10-13 MED ORDER — SODIUM CHLORIDE 0.9 % IV SOLN
200.0000 mg | Freq: Once | INTRAVENOUS | Status: AC
  Administered 2022-10-13: 200 mg via INTRAVENOUS
  Filled 2022-10-13: qty 200

## 2022-10-13 NOTE — Assessment & Plan Note (Signed)
-  FO showed MSI-High disease, and multiple mutations including APC, [name]6, and BRCA2 mutations.  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  -we previously discussed testing for her children when they are of age. 

## 2022-10-13 NOTE — Patient Instructions (Signed)
[facility]  Discharge Instructions: Thank you for choosing [facility] to provide your oncology and hematology care.   If you have a lab appointment with the Cancer Center, please go directly to the Cancer Center and check in at the registration area.   Wear comfortable clothing and clothing appropriate for easy access to any Portacath or PICC line.   We strive to give you quality time with your provider. You may need to reschedule your appointment if you arrive late (15 or more minutes).  Arriving late affects you and other patients whose appointments are after yours.  Also, if you miss three or more appointments without notifying the office, you may be dismissed from the clinic at the provider's discretion.      For prescription refill requests, have your pharmacy contact our office and allow 72 hours for refills to be completed.    Today you received the following chemotherapy and/or immunotherapy agents: Keytruda      To help prevent nausea and vomiting after your treatment, we encourage you to take your nausea medication as directed.  BELOW ARE SYMPTOMS THAT SHOULD BE REPORTED IMMEDIATELY: *FEVER GREATER THAN 100.4 F (38 C) OR HIGHER *CHILLS OR SWEATING *NAUSEA AND VOMITING THAT IS NOT CONTROLLED WITH YOUR NAUSEA MEDICATION *UNUSUAL SHORTNESS OF BREATH *UNUSUAL BRUISING OR BLEEDING *URINARY PROBLEMS (pain or burning when urinating, or frequent urination) *BOWEL PROBLEMS (unusual diarrhea, constipation, pain near the anus) TENDERNESS IN MOUTH AND THROAT WITH OR WITHOUT PRESENCE OF ULCERS (sore throat, sores in mouth, or a toothache) UNUSUAL RASH, SWELLING OR PAIN  UNUSUAL VAGINAL DISCHARGE OR ITCHING   Items with * indicate a potential emergency and should be followed up as soon as possible or go to the Emergency Department if any problems should occur.  Please show the CHEMOTHERAPY ALERT CARD or IMMUNOTHERAPY ALERT CARD at  check-in to the Emergency Department and triage nurse.  Should you have questions after your visit or need to cancel or reschedule your appointment, please contact [facility]  Dept: [phone]  and follow the prompts.  Office hours are 8:00 a.m. to 4:30 p.m. Monday - Friday. Please note that voicemails left after 4:00 p.m. may not be returned until the following business day.  We are closed weekends and major holidays. You have access to a nurse at all times for urgent questions. Please call the main number to the clinic Dept: [phone] and follow the prompts.   For any non-urgent questions, you may also contact your provider using MyChart. We now offer e-Visits for anyone 18 and older to request care online for non-urgent symptoms. For details visit [email or web address].   Also download the MyChart app! Go to the app store, search "MyChart", open the app, select [facility], and log in with your MyChart username and password.   

## 2022-10-13 NOTE — Assessment & Plan Note (Signed)
-  MMR loss of [name]6, [name] with high mutation burden, acquired BRCA mutation (+)  --Initially presented with diarrhea 11/04/20, found to have diverticulitis with perforation and also developed an abscess requiring 2 drains, multiple hospital stay  --she started FOLFOX on 06/22/21 but did not respond -she switched to Keytruda on 09/08/21. She has been tolerating well overall but has developed joint pain which could be related, overall manageable. She is clinically doing better also with more energy and less abdominal pain   -her CEA has normalized on immunotherapy. -CT CAP on 03/15/22 again showed mixed response -she had PET scan on 04/26/2022 for evaluation of her ovarian cysts, and saw GYN Dr. Newton, we decided to monitor it. -She tolerating Keytruda well, with mild joint pain.  She recently started workout at the gym, which has helped her joint pain. -restaging CT from 08/04/2022 showed stable disease in left abdomen and pelvis, no new lesions.  I personally reviewed her scan images, and discussed findings with patient.   -Will continue Keytruda  

## 2022-10-13 NOTE — Assessment & Plan Note (Signed)
-  Probably related to Keytruda, overall mild and manageable. -She is on Lyrica

## 2022-11-02 ENCOUNTER — Encounter: Payer: Self-pay | Admitting: Hematology

## 2022-11-03 ENCOUNTER — Encounter: Payer: Self-pay | Admitting: Hematology

## 2022-11-03 ENCOUNTER — Inpatient Hospital Stay: Payer: Managed Care, Other (non HMO)

## 2022-11-03 ENCOUNTER — Inpatient Hospital Stay (HOSPITAL_BASED_OUTPATIENT_CLINIC_OR_DEPARTMENT_OTHER): Payer: Managed Care, Other (non HMO) | Admitting: Hematology

## 2022-11-03 ENCOUNTER — Encounter: Payer: Self-pay | Admitting: Internal Medicine

## 2022-11-03 VITALS — BP 160/99 | HR 75

## 2022-11-03 VITALS — BP 140/103 | HR 75 | Temp 98.7°F | Resp 18 | Ht 63.0 in | Wt 280.9 lb

## 2022-11-03 DIAGNOSIS — C187 Malignant neoplasm of sigmoid colon: Secondary | ICD-10-CM

## 2022-11-03 DIAGNOSIS — R300 Dysuria: Secondary | ICD-10-CM

## 2022-11-03 DIAGNOSIS — D1391 Familial adenomatous polyposis: Secondary | ICD-10-CM

## 2022-11-03 DIAGNOSIS — C786 Secondary malignant neoplasm of retroperitoneum and peritoneum: Secondary | ICD-10-CM

## 2022-11-03 DIAGNOSIS — D649 Anemia, unspecified: Secondary | ICD-10-CM

## 2022-11-03 DIAGNOSIS — M2550 Pain in unspecified joint: Secondary | ICD-10-CM

## 2022-11-03 DIAGNOSIS — Z452 Encounter for adjustment and management of vascular access device: Secondary | ICD-10-CM

## 2022-11-03 LAB — CMP (CANCER CENTER ONLY)
ALT: 8 U/L (ref 0–44)
AST: 12 U/L — ABNORMAL LOW (ref 15–41)
Albumin: 3.9 g/dL (ref 3.5–5.0)
Alkaline Phosphatase: 48 U/L (ref 38–126)
Anion gap: 6 (ref 5–15)
BUN: 16 mg/dL (ref 6–20)
CO2: 28 mmol/L (ref 22–32)
Calcium: 8.7 mg/dL — ABNORMAL LOW (ref 8.9–10.3)
Chloride: 107 mmol/L (ref 98–111)
Creatinine: 0.68 mg/dL (ref 0.44–1.00)
GFR, Estimated: >60 mL/min (ref >60)
Glucose, Bld: 90 mg/dL (ref 70–99)
Potassium: 3.7 mmol/L (ref 3.5–5.1)
Sodium: 141 mmol/L (ref 135–145)
Total Bilirubin: 0.3 mg/dL (ref 0.3–1.2)
Total Protein: 7.4 g/dL (ref 6.5–8.1)

## 2022-11-03 LAB — CBC WITH DIFFERENTIAL (CANCER CENTER ONLY)
Abs Immature Granulocytes: 0.01 10*3/uL (ref 0.00–0.07)
Basophils Absolute: 0 10*3/uL (ref 0.0–0.1)
Basophils Relative: 0 %
Eosinophils Absolute: 0.1 10*3/uL (ref 0.0–0.5)
Eosinophils Relative: 2 %
HCT: 33.8 % — ABNORMAL LOW (ref 36.0–46.0)
Hemoglobin: 11.5 g/dL — ABNORMAL LOW (ref 12.0–15.0)
Immature Granulocytes: 0 %
Lymphocytes Relative: 37 %
Lymphs Abs: 1.7 10*3/uL (ref 0.7–4.0)
MCH: 29 pg (ref 26.0–34.0)
MCHC: 34 g/dL (ref 30.0–36.0)
MCV: 85.4 fL (ref 80.0–100.0)
Monocytes Absolute: 0.3 10*3/uL (ref 0.1–1.0)
Monocytes Relative: 6 %
Neutro Abs: 2.6 10*3/uL (ref 1.7–7.7)
Neutrophils Relative %: 55 %
Platelet Count: 355 10*3/uL (ref 150–400)
RBC: 3.96 MIL/uL (ref 3.87–5.11)
RDW: 13.7 % (ref 11.5–15.5)
WBC Count: 4.7 10*3/uL (ref 4.0–10.5)
nRBC: 0 % (ref 0.0–0.2)

## 2022-11-03 LAB — URINALYSIS, COMPLETE (UACMP) WITH MICROSCOPIC
Bilirubin Urine: NEGATIVE
Glucose, UA: NEGATIVE mg/dL
Ketones, ur: NEGATIVE mg/dL
Leukocytes,Ua: NEGATIVE
Nitrite: NEGATIVE
Protein, ur: NEGATIVE mg/dL
Specific Gravity, Urine: 1.009 (ref 1.005–1.030)
pH: 6 (ref 5.0–8.0)

## 2022-11-03 LAB — IRON AND IRON BINDING CAPACITY (CC-WL,HP ONLY)
Iron: 55 ug/dL (ref 28–170)
Saturation Ratios: 17 % (ref 10.4–31.8)
TIBC: 333 ug/dL (ref 250–450)
UIBC: 278 ug/dL (ref 148–442)

## 2022-11-03 LAB — PREGNANCY, URINE: Preg Test, Ur: NEGATIVE

## 2022-11-03 MED ORDER — SODIUM CHLORIDE 0.9% FLUSH
10.0000 mL | INTRAVENOUS | Status: DC | PRN
  Administered 2022-11-03: 10 mL

## 2022-11-03 MED ORDER — FUROSEMIDE 20 MG PO TABS: 20.0000 mg | ORAL_TABLET | Freq: Every day | ORAL | 0 refills | Status: DC | PRN

## 2022-11-03 MED ORDER — SODIUM CHLORIDE 0.9 % IV SOLN: Freq: Once | INTRAVENOUS | Status: AC

## 2022-11-03 MED ORDER — SODIUM CHLORIDE 0.9% FLUSH
10.0000 mL | Freq: Once | INTRAVENOUS | Status: AC
  Administered 2022-11-03: 10 mL

## 2022-11-03 MED ORDER — HEPARIN SOD (PORK) LOCK FLUSH 100 UNIT/ML IV SOLN
500.0000 [IU] | Freq: Once | INTRAVENOUS | Status: AC | PRN
  Administered 2022-11-03: 500 [IU]

## 2022-11-03 MED ORDER — SODIUM CHLORIDE 0.9 % IV SOLN
200.0000 mg | Freq: Once | INTRAVENOUS | Status: AC
  Administered 2022-11-03: 200 mg via INTRAVENOUS
  Filled 2022-11-03: qty 200

## 2022-11-03 MED ORDER — POTASSIUM CHLORIDE CRYS ER 20 MEQ PO TBCR: 20.0000 meq | EXTENDED_RELEASE_TABLET | Freq: Every day | ORAL | 0 refills | Status: DC

## 2022-11-03 NOTE — Patient Instructions (Signed)
[facility]  Discharge Instructions: Thank you for choosing [facility] to provide your oncology and hematology care.   If you have a lab appointment with the Cancer Center, please go directly to the Cancer Center and check in at the registration area.   Wear comfortable clothing and clothing appropriate for easy access to any Portacath or PICC line.   We strive to give you quality time with your provider. You may need to reschedule your appointment if you arrive late (15 or more minutes).  Arriving late affects you and other patients whose appointments are after yours.  Also, if you miss three or more appointments without notifying the office, you may be dismissed from the clinic at the provider's discretion.      For prescription refill requests, have your pharmacy contact our office and allow 72 hours for refills to be completed.    Today you received the following chemotherapy and/or immunotherapy agents: Keytruda      To help prevent nausea and vomiting after your treatment, we encourage you to take your nausea medication as directed.  BELOW ARE SYMPTOMS THAT SHOULD BE REPORTED IMMEDIATELY: *FEVER GREATER THAN 100.4 F (38 C) OR HIGHER *CHILLS OR SWEATING *NAUSEA AND VOMITING THAT IS NOT CONTROLLED WITH YOUR NAUSEA MEDICATION *UNUSUAL SHORTNESS OF BREATH *UNUSUAL BRUISING OR BLEEDING *URINARY PROBLEMS (pain or burning when urinating, or frequent urination) *BOWEL PROBLEMS (unusual diarrhea, constipation, pain near the anus) TENDERNESS IN MOUTH AND THROAT WITH OR WITHOUT PRESENCE OF ULCERS (sore throat, sores in mouth, or a toothache) UNUSUAL RASH, SWELLING OR PAIN  UNUSUAL VAGINAL DISCHARGE OR ITCHING   Items with * indicate a potential emergency and should be followed up as soon as possible or go to the Emergency Department if any problems should occur.  Please show the CHEMOTHERAPY ALERT CARD or IMMUNOTHERAPY ALERT CARD at  check-in to the Emergency Department and triage nurse.  Should you have questions after your visit or need to cancel or reschedule your appointment, please contact [facility]  Dept: [phone]  and follow the prompts.  Office hours are 8:00 a.m. to 4:30 p.m. Monday - Friday. Please note that voicemails left after 4:00 p.m. may not be returned until the following business day.  We are closed weekends and major holidays. You have access to a nurse at all times for urgent questions. Please call the main number to the clinic Dept: [phone] and follow the prompts.   For any non-urgent questions, you may also contact your provider using MyChart. We now offer e-Visits for anyone 18 and older to request care online for non-urgent symptoms. For details visit [email or web address].   Also download the MyChart app! Go to the app store, search "MyChart", open the app, select [facility], and log in with your MyChart username and password.   

## 2022-11-03 NOTE — Assessment & Plan Note (Signed)
-  FO showed MSI-High disease, and multiple mutations including APC, [name]6, and BRCA2 mutations.  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  -we previously discussed testing for her children when they are of age. 

## 2022-11-03 NOTE — Progress Notes (Signed)
[facility]   [phone] [phone]   Clinic Follow up Note   Patient Care Team: [name], MD as PCP - General (Internal Medicine) [name], MD as PCP - Cardiology (Cardiology) [name], [name] as Physician Assistant (Emergency Medicine) Malachy Mood, MD as Consulting Physician (Oncology) [name], MD as Consulting Physician (General Surgery) [name], [name], MD as Consulting Physician (Neurology) [name], [name], MD as Consulting Physician (Obstetrics and Gynecology) [name], MD as Consulting Physician (Gastroenterology) [name], [name], NP as Nurse Practitioner (Nurse Practitioner)  Date of Service:  11/03/2022  CHIEF COMPLAINT: f/u of metastatic colon cancer   CURRENT THERAPY:  Pembrolizumab q21d   ASSESSMENT:  Mikayla Rios is a 37 y.o. female with   Malignant neoplasm of sigmoid colon (HCC) -MMR loss of [name]6, [name] with high mutation burden, acquired BRCA mutation (+)  --Initially presented with diarrhea 11/04/20, found to have diverticulitis with perforation and also developed an abscess requiring 2 drains, multiple hospital stay  --she started FOLFOX on 06/22/21 but did not respond -she switched to [facility] on 09/08/21. She has been tolerating well overall but has developed joint pain which could be related, overall manageable. She is clinically doing better also with more energy and less abdominal pain   -her CEA has normalized on immunotherapy. -CT CAP on 03/15/22 again showed mixed response -she had PET scan on 04/26/2022 for evaluation of her ovarian cysts, and saw GYN Dr. [name], we decided to monitor it. -She tolerating Keytruda well, with mild joint pain.  She recently started workout at the gym, which has helped her joint pain. -restaging CT from 08/04/2022 showed stable disease in left abdomen and pelvis, no new lesions.  I personally reviewed her scan images, and discussed  findings with patient.   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  -we previously discussed testing for her children when they are of age.    Arthralgia -Probably related to Keytruda, overall mild and manageable. -She tried Lyrica but did not like it and stopped it    Weight gain and leg edema -Likely fluid overload, no dyspnea on exertion -I will call in Lasix for her to use as needed   PLAN: -I called in Lasix and oral potassium for her -Lab reviewed, will proceed with Keytruda today and continue every 3 weeks -Follow-up with me in 6 weeks, will order restaging CT scan on next visit    SUMMARY OF ONCOLOGIC HISTORY: Oncology History Overview Note   Cancer Staging  Malignant neoplasm of sigmoid colon [facility]) Staging form: Colon and Rectum, AJCC 8th Edition - Pathologic stage from 04/15/2021: Stage IIC (pT4b, pN0, cM0) - Signed by Malachy Mood, MD on 06/12/2021    Malignant neoplasm of sigmoid colon [facility])   Initial Diagnosis   Malignant neoplasm of sigmoid colon (HCC)   11/04/2020 Imaging   EXAM: CT ABDOMEN AND PELVIS WITHOUT CONTRAST  IMPRESSION: 1. Perforating descending colonic diverticulitis with multiple abdominopelvic gas and fluid collections, measuring up to 5.7 cm and further described above. 2. Cholelithiasis without findings of acute cholecystitis. 3. 3.6 cm benign left adrenal adenoma. 4. Leiomyomatous uterus. 5. 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   11/07/2020 Imaging   EXAM: CT ABDOMEN AND PELVIS  WITHOUT CONTRAST  IMPRESSION: Continued wall thickening is seen involving descending colon suggesting infectious or inflammatory colitis or perforated diverticulitis.  There is an adjacent fluid collection measuring 7.0 x 5.0 cm consistent with abscess which is significantly enlarged compared to prior exam. Adjacent to the abscess, there is a severely thickened small bowel loop most consistent with secondary inflammation.   5.2 x 3.5 cm fluid collection is noted within the left psoas muscle consistent with abscess which is significantly enlarged compared to prior exam. 4.4 x 3.8 cm fluid collection consistent with abscess is noted in the left retroperitoneal region which is also enlarged compared to prior exam.   Fibroid uterus.   Cholelithiasis.   11/27/2020 Imaging   EXAM: CT ABDOMEN AND PELVIS WITHOUT CONTRAST  IMPRESSION: 1. Significantly interval decreased size of the previously visualized left retroperitoneal and left anterior mid abdominal fluid collections. There is persistent fat stranding and mural thickening of the mid descending colon at this level. 2. Unchanged leiomyomatous uterus. 3. Unchanged cholelithiasis.   12/11/2020 Imaging   EXAM: CT ABDOMEN AND PELVIS WITHOUT CONTRAST  IMPRESSION: 1. Stable inflammatory changes centered around the distal descending colon in the left lower abdomen. Pericolonic inflammatory changes have minimally changed since 11/27/2020. Small pocket of gas medial to the colon is probably associated with a fistula or small residual abscess collection. 2. Stable position of the two percutaneous drains. The more anterior drain may be extending through a portion of the small bowel. 3. Cholelithiasis. 4. Fibroid uterus. Cannot exclude an ovarian/adnexal cystic structure near the uterine fundus. 5. Left adrenal adenoma.   01/07/2021 Imaging   EXAM: CT ABDOMEN AND PELVIS WITH CONTRAST  IMPRESSION: 1. No new abscesses identified. Similar degree of soft tissue thickening seen in the left pericolonic region. The degree of persistent soft tissues thickening in the pericolonic space further raises suspicions for  malignancy. Further evaluation with colonoscopy should be performed. 2. Left retroperitoneal abscess drain unchanged in position. Anterior left abdominal drain again seen terminating within small bowel loop. 3. 2.6 cm mildly sclerotic lesion noted in the L2 vertebral body. Further evaluation with contrast enhanced lumbar spine MRI should be performed.   02/04/2021 Imaging   EXAM: CT ABDOMEN AND PELVIS WITH CONTRAST  IMPRESSION: No new abdominopelvic collections or abscess development in the 1 month interval.   Left anterior drain remains within a loop of small bowel, unchanged.   Left lateral abscess drain remains in the retroperitoneal space adjacent to the iliopsoas muscle with a small amount of residual fluid and air but no measurable collection.   Stable soft tissue prominence and pericolonic strandy edema/inflammation about the left descending colon compatible with residual diverticulitis/colitis. Difficult to exclude underlying transmural lesion.   04/01/2021 Imaging   EXAM: CT ABDOMEN AND PELVIS WITH CONTRAST  IMPRESSION: There appears to be significant enhancement and wall thickening involving the descending colon with some degree of traction and involvement of adjacent small bowel loops. This is consistent with the history of diverticulitis and perforation. Stable position of percutaneous drainage catheter is seen adjacent to left psoas muscle with no significant residual fluid remaining. The other percutaneous drainage catheter that was previously noted to be within small bowel loop on prior exam in the left lower quadrant, has significantly retracted and appears to be outside of the peritoneal space at this time.   Since the prior exam, there does appear to be some degree of rotation involving mesenteric vessels and structures in the right lower quadrant, suggesting partial volvulus or malrotation.  There is the interval development of several lymph nodes in this area,  most likely inflammatory or reactive in etiology. Mild amount of free fluid is also noted in the pelvis. However, no significant bowel wall thickening or dilatation is seen in this area. These results will be called to the ordering clinician or representative by the Radiologist Assistant, and communication documented in the PACS or zVision Dashboard.   Stable uterine fibroid.   Hepatic steatosis.   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   Cholelithiasis.   04/10/2021 Imaging   EXAM: CT ANGIOGRAPHY CHEST CT ABDOMEN AND PELVIS WITH CONTRAST  IMPRESSION: 1. Again seen are findings compatible with descending colon diverticulitis with perforation. Free air and inflammation have increased in the interval. 2. New lobulated enhancing fluid collection posterior to this segment of inflamed colon measuring 8.5 x 3.5 x 10.0 cm. This collection now invades the adjacent iliopsoas muscle as well as extends through the left lateral abdominal wall. The tip of the drainage catheter is in this collection. Findings are compatible with abscess. 3. Anterior left percutaneous drainage catheter tip has been pulled back and is now within the subcutaneous tissues. 4. Trace free fluid. 5. No pulmonary embolism.  No acute cardiopulmonary process. 6. Cholelithiasis. 7. Fatty infiltration of the liver.   04/15/2021 Definitive Surgery   FINAL MICROSCOPIC DIAGNOSIS:   A. SMALL BOWEL, RESECTION:  - Adenocarcinoma.  - No carcinoma identified in 1 lymph node.   B. PERFORATED LEFT COLON, RESECTION:  - Moderately differentiated colonic adenocarcinoma.  - Tumor extends into pericolonic adipose tissue, and is strongly  suggestive of invasion into small bowel.  See oncology table/comments.  - No carcinoma identified in 4 lymph nodes (0/4).  - Tubular adenoma with high-grade dysplasia, 1.  - Tubular adenomas with low grade dysplasia, 3.   Comments: The size of the tumor is difficult to estimate secondary to the  disrupted nature of the specimen, as well as the infiltrative nature of the tumor.  Tumor can be identified as definitely invading into the pericolonic adipose tissue (block B4), but given the extreme disruption of the tissue, and the involvement of the small bowel by what appears to be a colonic adenocarcinoma, I favor perforation of the large bowel with direct invasion into the small bowel.  Accordingly, I believe this is best regarded as a pT4b lesion.   ADDENDUM:  Mismatch Repair Protein (IHC)  SUMMARY INTERPRETATION: ABNORMAL  There is loss of the major MMR protein [name]6: This indicates a high probability that a hereditary germline mutation is present and referral to genetic counseling is warranted. It is recommended that the loss of protein expression be correlated with molecular based microsatellite instability testing.   IHC EXPRESSION RESULTS  TEST           RESULT  MLH1:          Preserved nuclear expression  [name]2:          Preserved nuclear expression  [name]6:          LOSS OF NUCLEAR EXPRESSION  PMS2:          Preserved nuclear expression    04/15/2021 Cancer Staging   Staging form: Colon and Rectum, AJCC 8th Edition - Pathologic stage from 04/15/2021: Stage IIC (pT4b, pN0, cM0) - Signed by Malachy Mood, MD on 06/12/2021 Stage prefix: Initial diagnosis Total positive nodes: 0 Histologic grading system: 4 grade system Histologic grade (G): G2 Residual tumor (R): R0 - None   04/29/2021 Imaging  EXAM: CT ABDOMEN AND PELVIS WITH CONTRAST  IMPRESSION: Continued left retroperitoneal abscesses inferior to the left kidney, involving the left psoas muscle and left abdominal wall musculature. Overall size is decreased since prior study. Interval removal of left lower quadrant abscess drainage catheter.   New fluid collection in the cul-de-sac and wrapping around the uterus concerning for abscess.   Cholelithiasis.   Small left pleural effusion.  Bibasilar atelectasis.   Stable left  adrenal adenoma.  ADDENDUM: After discussing the case with Dr. Bryn Gulling in interventional radiology, it was noted that the fluid in the pelvis originally thought to be in the cul-de-sac is likely within the vagina, best seen on sagittal imaging. Recommend speculum exam.   Also, in the left lateral wall in the area of prior abscess in the lateral wall abdominal musculature, some of the abdomen soft tissue appears to be enhancing. While this could be infectious, cannot exclude tumor seeding in the left lateral abdominal wall, measuring 6.9 x 3.8 cm on image 64 of series 2.   06/16/2021 Imaging   EXAM: CT ABDOMEN AND PELVIS WITH CONTRAST  IMPRESSION: Increased size of bulky soft tissue masses involving the left psoas muscle and left lateral abdominal wall soft tissues, consistent with progressive metastatic disease.   Significant progression of multiple peritoneal masses throughout the abdomen and pelvis, consistent with peritoneal carcinomatosis.   New mild retroperitoneal lymphadenopathy, consistent with metastatic disease.   Stable uterine fibroid and left adrenal adenoma.   Cholelithiasis, without evidence of cholecystitis.   06/22/2021 - 08/19/2021 Chemotherapy   Patient is on Treatment Plan : COLORECTAL FOLFOX q14d x 6 months     06/22/2021 Tumor Marker   Patient's tumor was tested for the following markers: CEA. Results of the tumor marker test revealed 87.41.   07/10/2021 Pathology Results   FINAL MICROSCOPIC DIAGNOSIS:   A. PERITONEAL MASS, LEFT UPPER ABDOMINAL QUADRANT, BIOPSY:  -  Metastatic adenocarcinoma with necrosis, histologically consistent with colon primary.     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 The report date is 07/16/2021. The remainder of testing was negative/normal.  The CancerNext-Expanded + RNAinsight gene panel offered by [employer] and includes sequencing and rearrangement  analysis for the following 77 genes: IP, ALK, APC*, ATM*, AXIN2, BAP1, BARD1, BLM, BMPR1A, BRCA1*, BRCA2*, BRIP1*, CDC73, CDH1*,CDK4, CDKN1B, CDKN2A, CHEK2*, CTNNA1, DICER1, FANCC, FH, FLCN, GALNT12, KIF1B, LZTR1, MAX, MEN1, MET, MLH1*, [name]2*, [name]3, [name]6*, MUTYH*, NBN, NF1*, NF2, NTHL1, PALB2*, PHOX2B, PMS2*, POT1, PRKAR1A, PTCH1, PTEN*, RAD51C*, RAD51D*,RB1, RECQL, RET, SDHA, SDHAF2, SDHB, SDHC, SDHD, SMAD4, SMARCA4, SMARCB1, SMARCE1, STK11, SUFU, TMEM127, TP53*,TSC1, TSC2, VHL and XRCC2 (sequencing and deletion/duplication); EGFR, EGLN1, HOXB13, KIT, MITF, PDGFRA, POLD1 and POLE (sequencing only); EPCAM and GREM1 (deletion/duplication only).   08/28/2021 Imaging   EXAM: CT CHEST, ABDOMEN, AND PELVIS WITH CONTRAST  IMPRESSION: 1. Continued interval progression of multiple metastatic soft tissue nodules/masses in the abdomen and pelvis. 2. No evidence for metastatic disease in the chest. 3. Similar appearance of the subtle lesion in the L2 vertebral body, suspicious for metastatic involvement. 4. Cholelithiasis.   09/03/2021 -  Chemotherapy   Patient is on Treatment Plan : COLORECTAL Pembrolizumab (200) q21d     09/08/2021 - 02/01/2022 Chemotherapy   Patient is on Treatment Plan : COLORECTAL Pembrolizumab (200) q21d     01/08/2022 Imaging   CT CAP IMPRESSION: 1. Mixed response to therapy. Interval decrease in size of the conglomerate multilobulated mass extending from the left psoas  into the left posterior pararenal space through the left lateral abdominal wall along the tract of the previously placed percutaneous drainage catheter. Interval decrease in size of right adnexal mass and omental masses within the right lower quadrant of the abdomen. Interval development of peritoneal carcinomatosis with omental caking within the left lower quadrant of the abdomen and left subdiaphragmatic region adjacent to the spleen and along the left pericolic gutter as well as development of ascites appearing  loculated within the anterior peritoneum. 2. Interval development of mild left hydronephrosis secondary to stricturing of the mid left ureter. 3. Stable left adrenal metastasis. 4. Stable sclerotic lesion within the L2 vertebral body. No new lytic or blastic bone lesions are seen. 5. Cholelithiasis. 6. No evidence of intrathoracic metastatic disease. 7. 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 Moderate colonic stool burden. No evidence of obstruction.   03/15/2022 Imaging   IMPRESSION: 1. Status post sigmoid colon resection with left lower quadrant end colostomy. 2. Multicystic bilateral ovarian lesions are slightly increased in size. 3. Diminished, small volume of loculated appearing ascites throughout the abdomen and pelvis, with similar appearance of peritoneal thickening and nodularity throughout. 4. Heterogeneously calcified, conglomerate masses in the left retroperitoneum and abdominal wall are slightly diminished in size. 5. Left adrenal lesion not significantly changed. 6. Unchanged faintly sclerotic metastatic lesion of L2. 7. Constellation of findings is consistent with mixed response to treatment however with overall little interval change. 8. Minimal residual left hydronephrosis and proximal hydroureter, improved compared to prior examination. 9. Cholelithiasis. 10. Coronary artery disease, significantly advanced for patient age.   08/04/2022 Imaging    IMPRESSION: CT CHEST:   1. No developing mass lesion, fluid collection or lymph node enlargement in the thorax. Stable tiny 3 mm lung nodule. 2. Coronary artery calcifications. Please correlate for other coronary risk factors. 3. Chest port   CT ABDOMEN AND PELVIS:   1. Extensive surgical changes identified. Left lower quadrant ostomy, diverting colostomy. Surgical changes along loops of small bowel in the right hemiabdomen. 2. Once again  there are areas of nodular tissue extending along the left lateral abdominal wall, into the retroperitoneum which are similar to previous. The amount of adjacent fluid in these locations has improved. 3. No new mass lesion, fluid collection or lymph node enlargement in the abdomen or pelvis. 4. Gallstones. 5. Enlarged uterus with fibroids. Bilateral complex cystic areas are once again seen and based on appearance differential includes dilated fallopian tubes, hydrosalpinx versus true cystic lesions. Please correlate for any known history or prior workup 6. 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 No developing mass lesion, fluid collection or lymph node enlargement in the thorax. Stable tiny 3 mm lung nodule. 2. Coronary artery calcifications. Please correlate for other coronary risk factors. 3. Chest port   CT ABDOMEN AND PELVIS:   1. Extensive surgical changes identified. Left lower quadrant ostomy, diverting colostomy. Surgical changes along loops of small bowel in the right hemiabdomen. 2. Once again there are areas of nodular tissue extending along the left lateral abdominal wall, into the retroperitoneum which are similar to previous.  The amount of adjacent fluid in these locations has improved. 3. No new mass lesion, fluid collection or lymph node enlargement in the abdomen or pelvis. 4. Gallstones. 5. Enlarged uterus with fibroids. Bilateral complex cystic areas are once again seen and based on appearance differential includes dilated fallopian tubes, hydrosalpinx versus true cystic lesions. Please correlate for any known history or prior  workup 6. Limited evaluation for solid organ pathology metastatic disease without the advantage of IV contrast        INTERVAL HISTORY:  Mikayla Rios is here for a follow up of metastatic colon cancer. She was last seen by me on 10/13/2022. She presents to the clinic with her husband.  She is clinically doing well overall, does report bilateral leg edema in the past few weeks, no calf pain or skin color change.  She also noticed some weight gain.  Mild respiratory stable, no other new complaints.    All other systems were reviewed with the patient and are negative.  MEDICAL HISTORY:  Past Medical History:  Diagnosis Date   Allergy    seasonal   Anemia    Blood infection 1985   Blood transfusion without reported diagnosis    colon ca 04/2021   Diverticulitis    Family history of breast cancer    Family history of colon cancer    Family history of colon cancer    Family history of stomach cancer    Hypertension    Obesity    Sleep apnea     SURGICAL HISTORY: Past Surgical History:  Procedure Laterality Date   COLECTOMY WITH COLOSTOMY CREATION/HARTMANN PROCEDURE N/A 04/15/2021   Procedure: COLOSTOMY CREATION/HARTMANN PROCEDURE;  Surgeon: [name], MD;  Location: [facility];  Service: General;  Laterality: N/A;   IR CATHETER TUBE CHANGE  12/12/2020   IR CATHETER TUBE CHANGE  01/27/2021   IR CATHETER TUBE CHANGE  02/19/2021   IR CATHETER TUBE CHANGE  04/13/2021   IR IMAGING GUIDED PORT INSERTION  10/02/2021   IR RADIOLOGIST EVAL & MGMT  11/27/2020   IR RADIOLOGIST EVAL & MGMT  12/11/2020   IR RADIOLOGIST EVAL & MGMT  01/07/2021   IR RADIOLOGIST EVAL & MGMT  02/04/2021   IR SINUS/FIST TUBE CHK-NON GI  12/12/2020   IR SINUS/FIST TUBE CHK-NON GI  02/19/2021   LAPAROSCOPIC PARTIAL COLECTOMY N/A 04/15/2021   Procedure: LAPAROSCOPIC ASSISTED HARTMANN RESECTION;  Surgeon: [name], MD;  Location: [facility];  Service: General;  Laterality: N/A;    I have reviewed the social  history and family history with the patient and they are unchanged from previous note.  ALLERGIES:  is allergic to chlorhexidine gluconate, shellfish allergy, and penicillins.  MEDICATIONS:  Current Outpatient Medications  Medication Sig Dispense Refill   furosemide (LASIX) 20 MG tablet Take 1 tablet (20 mg total) by mouth daily as needed. 30 tablet 0   potassium chloride SA (KLOR-CON M) 20 MEQ tablet Take 1 tablet (20 mEq total) by mouth daily. 30 tablet 0   ALPRAZolam (XANAX) 0.25 MG tablet Take 1 tablet (0.25 mg total) by mouth at bedtime as needed for anxiety. 30 tablet 0   amLODipine (NORVASC) 10 MG tablet TAKE 1 TABLET BY MOUTH EVERY DAY 90 tablet 3   HYDROcodone-Acetaminophen 5-300 MG TABS Take 1 tablet every 6 hours by oral route.     levonorgestrel (MIRENA, 52 MG,) 20 MCG/DAY IUD Take 1 device by intrauterine route.     lidocaine-prilocaine (EMLA) cream Apply 1 Application topically as  needed. 30 g 3   metoprolol tartrate 75 MG TABS Take 75 mg by mouth 2 (two) times daily. 90 tablet 3   oxyCODONE-acetaminophen (PERCOCET) 7.5-325 MG tablet Take 1 tablet by mouth every 8 (eight) hours as needed for severe pain. 30 tablet 0   POTASSIUM PO Take by mouth.     pregabalin (LYRICA) 75 MG capsule Take 1 capsule (75 mg total) by mouth at bedtime. X 1 week, then 75 mg BID x 1 week, then can increase to 75 in AM and 150 mg QHS- nerve pain 90 capsule 5   sulfamethoxazole-trimethoprim (BACTRIM DS) 800-160 MG tablet TAKE 1 TABLET(S) EVERY 12 HOURS BY ORAL ROUTE FOR 3 DAYS.     traZODone (DESYREL) 50 MG tablet TAKE 1-2 TABLETS BY MOUTH AT BEDTIME AS NEEDED FOR SLEEP. 180 tablet 1   No current facility-administered medications for this visit.   Facility-Administered Medications Ordered in Other Visits  Medication Dose Route Frequency Provider Last Rate Last Admin   fentaNYL (SUBLIMAZE) injection   Intravenous PRN Mir, Al Corpus, MD   50 mcg at 10/02/21 1436   midazolam (VERSED) injection    Intravenous PRN Mir, Al Corpus, MD   1 mg at 10/02/21 1436    PHYSICAL EXAMINATION: ECOG PERFORMANCE STATUS: 1 - Symptomatic but completely ambulatory  Vitals:   11/03/22 1217  BP: (!) 140/103  Pulse: 75  Resp: 18  Temp: 98.7 F (37.1 C)  SpO2: 100%   Wt Readings from Last 3 Encounters:  11/03/22 280 lb 14.4 oz (127.4 kg)  10/13/22 272 lb 14.4 oz (123.8 kg)  09/22/22 268 lb 1.6 oz (121.6 kg)     GENERAL:alert, no distress and comfortable SKIN: skin color, texture, turgor are normal, no rashes or significant lesions EYES: normal, Conjunctiva are pink and non-injected, sclera clear NECK: supple, thyroid normal size, non-tender, without nodularity LYMPH:  no palpable lymphadenopathy in the cervical, axillary  LUNGS: clear to auscultation and percussion with normal breathing effort HEART: regular rate & rhythm and no murmurs, (+) bilateral lower extremity edema ABDOMEN:abdomen soft, non-tender and normal bowel sounds Musculoskeletal:no cyanosis of digits and no clubbing  NEURO: alert & oriented x 3 with fluent speech, no focal motor/sensory deficits  LABORATORY DATA:  I have reviewed the data as listed    Latest Ref Rng & Units 11/03/2022   11:42 AM 10/13/2022   10:42 AM 09/22/2022   12:04 PM  CBC  WBC 4.0 - 10.5 K/uL 4.7  4.8  7.3   Hemoglobin 12.0 - 15.0 g/dL 16.1  09.6  04.5   Hematocrit 36.0 - 46.0 % 33.8  34.9  35.4   Platelets 150 - 400 K/uL 355  350  325         Latest Ref Rng & Units 11/03/2022   11:42 AM 10/13/2022   10:42 AM 09/22/2022   12:04 PM  CMP  Glucose 70 - 99 mg/dL 90  94  94   BUN 6 - 20 mg/dL 16  12  26    Creatinine 0.44 - 1.00 mg/dL 4.09  8.11  9.14   Sodium 135 - 145 mmol/L 141  138  137   Potassium 3.5 - 5.1 mmol/L 3.7  3.8  4.1   Chloride 98 - 111 mmol/L 107  106  107   CO2 22 - 32 mmol/L 28  27  26    Calcium 8.9 - 10.3 mg/dL 8.7  8.7  8.9   Total Protein 6.5 - 8.1 g/dL 7.4  7.2  7.3   Total Bilirubin 0.3 - 1.2 mg/dL 0.3  0.3  0.2   Alkaline  Phos 38 - 126 U/L 48  51  52   AST 15 - 41 U/L 12  12  12    ALT 0 - 44 U/L 8  9  10        RADIOGRAPHIC STUDIES: I have personally reviewed the radiological images as listed and agreed with the findings in the report. No results found.    Orders Placed This Encounter  Procedures   Urine Culture    Standing Status:   Future    Number of Occurrences:   1    Standing Expiration Date:   11/03/2023   Urinalysis, Complete w Microscopic    Standing Status:   Future    Number of Occurrences:   1    Standing Expiration Date:   11/03/2023   CBC with Differential (Cancer Center Only)    Standing Status:   Future    Standing Expiration Date:   02/16/2024   CMP (Cancer Center only)    Standing Status:   Future    Standing Expiration Date:   02/16/2024   All questions were answered. The patient knows to call the clinic with any problems, questions or concerns. No barriers to learning was detected. The total time spent in the appointment was 25 minutes.     Malachy Mood, MD 11/03/2022   Carolin Coy, CMA, am acting as scribe for Malachy Mood, MD.   I have reviewed the above documentation for accuracy and completeness, and I agree with the above.

## 2022-11-03 NOTE — Assessment & Plan Note (Signed)
-  MMR loss of [name]6, [name] with high mutation burden, acquired BRCA mutation (+)  --Initially presented with diarrhea 11/04/20, found to have diverticulitis with perforation and also developed an abscess requiring 2 drains, multiple hospital stay  --she started FOLFOX on 06/22/21 but did not respond -she switched to Keytruda on 09/08/21. She has been tolerating well overall but has developed joint pain which could be related, overall manageable. She is clinically doing better also with more energy and less abdominal pain   -her CEA has normalized on immunotherapy. -CT CAP on 03/15/22 again showed mixed response -she had PET scan on 04/26/2022 for evaluation of her ovarian cysts, and saw GYN Dr. Newton, we decided to monitor it. -She tolerating Keytruda well, with mild joint pain.  She recently started workout at the gym, which has helped her joint pain. -restaging CT from 08/04/2022 showed stable disease in left abdomen and pelvis, no new lesions.  I personally reviewed her scan images, and discussed findings with patient.   -Will continue Keytruda  

## 2022-11-03 NOTE — Assessment & Plan Note (Signed)
-  Probably related to Keytruda, overall mild and manageable. -She tried Lyrica but did not like it and stopped it

## 2022-11-04 ENCOUNTER — Other Ambulatory Visit: Payer: Self-pay

## 2022-11-04 LAB — URINE CULTURE

## 2022-11-04 MED ORDER — NITROFURANTOIN MONOHYD MACRO 100 MG PO CAPS: 100.0000 mg | ORAL_CAPSULE | Freq: Two times a day (BID) | ORAL | 0 refills | Status: AC

## 2022-11-04 MED ORDER — METOPROLOL TARTRATE 75 MG PO TABS: 75.0000 mg | ORAL_TABLET | Freq: Two times a day (BID) | ORAL | 3 refills | Status: DC

## 2022-11-04 NOTE — Progress Notes (Signed)
Original order given by Dr. Mosetta Putt was for Augmentin but informed Dr. Mosetta Putt that pt has an allergy to Penicillin.  New verbal order given for Ciprofloxacin but pt has hx of prolonged QT; therefore, new verbal order given by Dr. Mosetta Putt for macrobid 100mg  PO BID for 5 days.  Prescription ordered and sent to pt's preferred pharmacy on file.  Tried calling pt to inform pt of the prescription for her UTI but no answer; therefore, sent a MyChart message to pt stating this information.

## 2022-11-05 ENCOUNTER — Encounter: Payer: Self-pay | Admitting: Hematology

## 2022-11-05 LAB — URINE CULTURE: Culture: 20000 — AB

## 2022-11-12 ENCOUNTER — Encounter: Payer: Self-pay | Admitting: Internal Medicine

## 2022-11-12 ENCOUNTER — Ambulatory Visit: Payer: Self-pay

## 2022-11-23 ENCOUNTER — Ambulatory Visit: Payer: Medicaid Other

## 2022-11-23 ENCOUNTER — Encounter: Payer: Self-pay | Admitting: Internal Medicine

## 2022-11-23 VITALS — BP 110/82 | Temp 98.1°F | Ht 63.0 in | Wt 280.0 lb

## 2022-11-23 DIAGNOSIS — I10 Essential (primary) hypertension: Secondary | ICD-10-CM

## 2022-11-23 NOTE — Progress Notes (Signed)
Patient presents today for bpc. She currently takes Amlodipine 10MG  she reports taking in the morning & Metoprolol Tartrate 75MG  she reports not taking currently she has not picked up medication from the pharmacy. Dr. Mosetta Putt prescribed Lasix 20MG  for patient she was told by specialists to stop taking. At previous visit patient told to follow an low sodium diet. She admits doing so.  BP Readings from Last 3 Encounters:  11/23/22 110/82  11/03/22 (!) 160/99  11/03/22 (!) 140/103  Per provider patient is to follow up at upcoming physical appointment on 11/30/2022. Patient aware to continue with current regimen.

## 2022-11-23 NOTE — Telephone Encounter (Signed)
Patient was called to inform her it is no copay for a nurse visit.

## 2022-11-23 NOTE — Patient Instructions (Signed)
Hypertension, Adult Hypertension is another name for high blood pressure. High blood pressure forces your heart to work harder to pump blood. This can cause problems over time. There are two numbers in a blood pressure reading. There is a top number (systolic) over a bottom number (diastolic). It is best to have a blood pressure that is below 120/80. What are the causes? The cause of this condition is not known. Some other conditions can lead to high blood pressure. What increases the risk? Some lifestyle factors can make you more likely to develop high blood pressure: Smoking. Not getting enough exercise or physical activity. Being overweight. Having too much fat, sugar, calories, or salt (sodium) in your diet. Drinking too much alcohol. Other risk factors include: Having any of these conditions: Heart disease. Diabetes. High cholesterol. Kidney disease. Obstructive sleep apnea. Having a family history of high blood pressure and high cholesterol. Age. The risk increases with age. Stress. What are the signs or symptoms? High blood pressure may not cause symptoms. Very high blood pressure (hypertensive crisis) may cause: Headache. Fast or uneven heartbeats (palpitations). Shortness of breath. Nosebleed. Vomiting or feeling like you may vomit (nauseous). Changes in how you see. Very bad chest pain. Feeling dizzy. Seizures. How is this treated? This condition is treated by making healthy lifestyle changes, such as: Eating healthy foods. Exercising more. Drinking less alcohol. Your doctor may prescribe medicine if lifestyle changes do not help enough and if: Your top number is above 130. Your bottom number is above 80. Your personal target blood pressure may vary. Follow these instructions at home: Eating and drinking  If told, follow the DASH eating plan. To follow this plan: Fill one half of your plate at each meal with fruits and vegetables. Fill one fourth of your plate  at each meal with whole grains. Whole grains include whole-wheat pasta, brown rice, and whole-grain bread. Eat or drink low-fat dairy products, such as skim milk or low-fat yogurt. Fill one fourth of your plate at each meal with low-fat (lean) proteins. Low-fat proteins include fish, chicken without skin, eggs, beans, and tofu. Avoid fatty meat, cured and processed meat, or chicken with skin. Avoid pre-made or processed food. Limit the amount of salt in your diet to less than 1,500 mg each day. Do not drink alcohol if: Your doctor tells you not to drink. You are pregnant, may be pregnant, or are planning to become pregnant. If you drink alcohol: Limit how much you have to: 0-1 drink a day for women. 0-2 drinks a day for men. Know how much alcohol is in your drink. In the [place] one drink equals one 12 oz bottle of beer (355 mL), one 5 oz glass of wine (148 mL), or one 1 oz glass of hard liquor (44 mL). Lifestyle  Work with your doctor to stay at a healthy weight or to lose weight. Ask your doctor what the best weight is for you. Get at least 30 minutes of exercise that causes your heart to beat faster (aerobic exercise) most days of the week. This may include walking, swimming, or biking. Get at least 30 minutes of exercise that strengthens your muscles (resistance exercise) at least 3 days a week. This may include lifting weights or doing Pilates. Do not smoke or use any products that contain nicotine or tobacco. If you need help quitting, ask your doctor. Check your blood pressure at home as told by your doctor. Keep all follow-up visits. Medicines Take over-the-counter and prescription medicines   only as told by your doctor. Follow directions carefully. Do not skip doses of blood pressure medicine. The medicine does not work as well if you skip doses. Skipping doses also puts you at risk for problems. Ask your doctor about side effects or reactions to medicines that you should watch  for. Contact a doctor if: You think you are having a reaction to the medicine you are taking. You have headaches that keep coming back. You feel dizzy. You have swelling in your ankles. You have trouble with your vision. Get help right away if: You get a very bad headache. You start to feel mixed up (confused). You feel weak or numb. You feel faint. You have very bad pain in your: Chest. Belly (abdomen). You vomit more than once. You have trouble breathing. These symptoms may be an emergency. Get help right away. Call 911. Do not wait to see if the symptoms will go away. Do not drive yourself to the hospital. Summary Hypertension is another name for high blood pressure. High blood pressure forces your heart to work harder to pump blood. For most people, a normal blood pressure is less than 120/80. Making healthy choices can help lower blood pressure. If your blood pressure does not get lower with healthy choices, you may need to take medicine. This information is not intended to replace advice given to you by your health care provider. Make sure you discuss any questions you have with your health care provider. Document Revised: 03/12/2021 Document Reviewed: 03/12/2021 Elsevier Patient Education  2024 Elsevier Inc.  

## 2022-11-24 ENCOUNTER — Other Ambulatory Visit: Payer: Self-pay

## 2022-11-24 ENCOUNTER — Inpatient Hospital Stay: Payer: Managed Care, Other (non HMO) | Attending: Hematology

## 2022-11-24 ENCOUNTER — Inpatient Hospital Stay: Payer: Managed Care, Other (non HMO)

## 2022-11-24 VITALS — BP 130/78 | HR 83 | Temp 98.4°F | Wt 280.5 lb

## 2022-11-24 DIAGNOSIS — C187 Malignant neoplasm of sigmoid colon: Secondary | ICD-10-CM | POA: Insufficient documentation

## 2022-11-24 DIAGNOSIS — Z79620 Long term (current) use of immunosuppressive biologic: Secondary | ICD-10-CM | POA: Diagnosis not present

## 2022-11-24 DIAGNOSIS — C786 Secondary malignant neoplasm of retroperitoneum and peritoneum: Secondary | ICD-10-CM

## 2022-11-24 DIAGNOSIS — Z5112 Encounter for antineoplastic immunotherapy: Secondary | ICD-10-CM | POA: Diagnosis present

## 2022-11-24 DIAGNOSIS — Z452 Encounter for adjustment and management of vascular access device: Secondary | ICD-10-CM

## 2022-11-24 LAB — CBC WITH DIFFERENTIAL (CANCER CENTER ONLY)
Abs Immature Granulocytes: 0.04 10*3/uL (ref 0.00–0.07)
Basophils Absolute: 0 10*3/uL (ref 0.0–0.1)
Basophils Relative: 0 %
Eosinophils Absolute: 0.1 10*3/uL (ref 0.0–0.5)
Eosinophils Relative: 2 %
HCT: 32.6 % — ABNORMAL LOW (ref 36.0–46.0)
Hemoglobin: 10.6 g/dL — ABNORMAL LOW (ref 12.0–15.0)
Immature Granulocytes: 1 %
Lymphocytes Relative: 27 %
Lymphs Abs: 1.5 10*3/uL (ref 0.7–4.0)
MCH: 27.9 pg (ref 26.0–34.0)
MCHC: 32.5 g/dL (ref 30.0–36.0)
MCV: 85.8 fL (ref 80.0–100.0)
Monocytes Absolute: 0.4 10*3/uL (ref 0.1–1.0)
Monocytes Relative: 7 %
Neutro Abs: 3.5 10*3/uL (ref 1.7–7.7)
Neutrophils Relative %: 63 %
Platelet Count: 346 10*3/uL (ref 150–400)
RBC: 3.8 MIL/uL — ABNORMAL LOW (ref 3.87–5.11)
RDW: 13.6 % (ref 11.5–15.5)
WBC Count: 5.6 10*3/uL (ref 4.0–10.5)
nRBC: 0 % (ref 0.0–0.2)

## 2022-11-24 LAB — CMP (CANCER CENTER ONLY)
ALT: 13 U/L (ref 0–44)
AST: 12 U/L — ABNORMAL LOW (ref 15–41)
Albumin: 3.5 g/dL (ref 3.5–5.0)
Alkaline Phosphatase: 53 U/L (ref 38–126)
Anion gap: 6 (ref 5–15)
BUN: 14 mg/dL (ref 6–20)
CO2: 21 mmol/L — ABNORMAL LOW (ref 22–32)
Calcium: 8.7 mg/dL — ABNORMAL LOW (ref 8.9–10.3)
Chloride: 111 mmol/L (ref 98–111)
Creatinine: 0.72 mg/dL (ref 0.44–1.00)
GFR, Estimated: >60 mL/min (ref >60)
Glucose, Bld: 99 mg/dL (ref 70–99)
Potassium: 3.6 mmol/L (ref 3.5–5.1)
Sodium: 138 mmol/L (ref 135–145)
Total Bilirubin: 0.3 mg/dL (ref 0.3–1.2)
Total Protein: 6.6 g/dL (ref 6.5–8.1)

## 2022-11-24 LAB — TSH: TSH: 1.095 u[IU]/mL (ref 0.350–4.500)

## 2022-11-24 LAB — PREGNANCY, URINE: Preg Test, Ur: NEGATIVE

## 2022-11-24 MED ORDER — SODIUM CHLORIDE 0.9% FLUSH
10.0000 mL | INTRAVENOUS | Status: DC | PRN
  Administered 2022-11-24: 10 mL

## 2022-11-24 MED ORDER — SODIUM CHLORIDE 0.9 % IV SOLN: Freq: Once | INTRAVENOUS | Status: AC

## 2022-11-24 MED ORDER — SODIUM CHLORIDE 0.9 % IV SOLN
200.0000 mg | Freq: Once | INTRAVENOUS | Status: AC
  Administered 2022-11-24: 200 mg via INTRAVENOUS
  Filled 2022-11-24: qty 200

## 2022-11-24 MED ORDER — HEPARIN SOD (PORK) LOCK FLUSH 100 UNIT/ML IV SOLN
500.0000 [IU] | Freq: Once | INTRAVENOUS | Status: AC | PRN
  Administered 2022-11-24: 500 [IU]

## 2022-11-24 MED ORDER — SODIUM CHLORIDE 0.9% FLUSH
10.0000 mL | Freq: Once | INTRAVENOUS | Status: AC
  Administered 2022-11-24: 10 mL

## 2022-11-24 NOTE — Patient Instructions (Signed)
[facility]  Discharge Instructions: Thank you for choosing [facility] to provide your oncology and hematology care.   If you have a lab appointment with the Cancer Center, please go directly to the Cancer Center and check in at the registration area.   Wear comfortable clothing and clothing appropriate for easy access to any Portacath or PICC line.   We strive to give you quality time with your provider. You may need to reschedule your appointment if you arrive late (15 or more minutes).  Arriving late affects you and other patients whose appointments are after yours.  Also, if you miss three or more appointments without notifying the office, you may be dismissed from the clinic at the provider's discretion.      For prescription refill requests, have your pharmacy contact our office and allow 72 hours for refills to be completed.    Today you received the following chemotherapy and/or immunotherapy agents: Keytruda      To help prevent nausea and vomiting after your treatment, we encourage you to take your nausea medication as directed.  BELOW ARE SYMPTOMS THAT SHOULD BE REPORTED IMMEDIATELY: *FEVER GREATER THAN 100.4 F (38 C) OR HIGHER *CHILLS OR SWEATING *NAUSEA AND VOMITING THAT IS NOT CONTROLLED WITH YOUR NAUSEA MEDICATION *UNUSUAL SHORTNESS OF BREATH *UNUSUAL BRUISING OR BLEEDING *URINARY PROBLEMS (pain or burning when urinating, or frequent urination) *BOWEL PROBLEMS (unusual diarrhea, constipation, pain near the anus) TENDERNESS IN MOUTH AND THROAT WITH OR WITHOUT PRESENCE OF ULCERS (sore throat, sores in mouth, or a toothache) UNUSUAL RASH, SWELLING OR PAIN  UNUSUAL VAGINAL DISCHARGE OR ITCHING   Items with * indicate a potential emergency and should be followed up as soon as possible or go to the Emergency Department if any problems should occur.  Please show the CHEMOTHERAPY ALERT CARD or IMMUNOTHERAPY ALERT CARD at  check-in to the Emergency Department and triage nurse.  Should you have questions after your visit or need to cancel or reschedule your appointment, please contact [facility]  Dept: [phone]  and follow the prompts.  Office hours are 8:00 a.m. to 4:30 p.m. Monday - Friday. Please note that voicemails left after 4:00 p.m. may not be returned until the following business day.  We are closed weekends and major holidays. You have access to a nurse at all times for urgent questions. Please call the main number to the clinic Dept: [phone] and follow the prompts.   For any non-urgent questions, you may also contact your provider using MyChart. We now offer e-Visits for anyone 18 and older to request care online for non-urgent symptoms. For details visit [email or web address].   Also download the MyChart app! Go to the app store, search "MyChart", open the app, select [facility], and log in with your MyChart username and password.   

## 2022-11-25 ENCOUNTER — Other Ambulatory Visit: Payer: Self-pay | Admitting: Hematology

## 2022-11-26 LAB — T4: T4, Total: 7.3 ug/dL (ref 4.5–12.0)

## 2022-11-30 ENCOUNTER — Ambulatory Visit: Payer: Managed Care, Other (non HMO) | Admitting: Internal Medicine

## 2022-11-30 ENCOUNTER — Other Ambulatory Visit: Payer: Self-pay | Admitting: Internal Medicine

## 2022-11-30 ENCOUNTER — Encounter: Payer: Self-pay | Admitting: Internal Medicine

## 2022-11-30 ENCOUNTER — Encounter: Payer: Self-pay | Admitting: Hematology

## 2022-11-30 VITALS — BP 130/82 | HR 95 | Temp 98.6°F | Ht 63.0 in | Wt 290.6 lb

## 2022-11-30 DIAGNOSIS — Z0000 Encounter for general adult medical examination without abnormal findings: Secondary | ICD-10-CM | POA: Diagnosis not present

## 2022-11-30 DIAGNOSIS — I10 Essential (primary) hypertension: Secondary | ICD-10-CM

## 2022-11-30 DIAGNOSIS — Z6841 Body Mass Index (BMI) 40.0 and over, adult: Secondary | ICD-10-CM

## 2022-11-30 DIAGNOSIS — C187 Malignant neoplasm of sigmoid colon: Secondary | ICD-10-CM

## 2022-11-30 DIAGNOSIS — N390 Urinary tract infection, site not specified: Secondary | ICD-10-CM | POA: Diagnosis not present

## 2022-11-30 LAB — POCT URINALYSIS DIPSTICK
Bilirubin, UA: NEGATIVE
Glucose, UA: NEGATIVE
Ketones, UA: NEGATIVE
Leukocytes, UA: NEGATIVE
Nitrite, UA: POSITIVE
Protein, UA: POSITIVE — AB
Spec Grav, UA: >=1.03 — AB (ref 1.010–1.025)
Urobilinogen, UA: 0.2 E.U./dL
pH, UA: 6 (ref 5.0–8.0)

## 2022-11-30 NOTE — Progress Notes (Signed)
Pura Spice as a Neurosurgeon for Gwynneth Aliment, MD.,have documented all relevant documentation on the behalf of Gwynneth Aliment, MD,as directed by  Gwynneth Aliment, MD while in the presence of Gwynneth Aliment, MD.  Subjective:    Patient ID: Mikayla Rios , female    DOB: May 25, 1986 , 37 y.o.   MRN: [id number]  Chief Complaint  Patient presents with   Annual Exam   Hypertension    HPI  Patient presents today for annual exam. GYN: Dr. Jackelyn Knife. However, her most recent pap smear was performed by Dr. [name]. She is accompanied by her husband today. She is happy to report that Dr. Mosetta Putt has mentioned tumor regression with Keytruda. She is hopeful that she will be able to stop treatments in the near future.   She reports compliance with medications. Denies headache, chest pain, and sob.   Hypertension This is a chronic problem. The current episode started more than 1 year ago. The problem has been gradually improving since onset. The problem is controlled. Risk factors for coronary artery disease include obesity. Past treatments include calcium channel blockers and diuretics.     Past Medical History:  Diagnosis Date   Allergy    seasonal   Anemia    Blood infection 1985   Blood transfusion without reported diagnosis    colon ca 04/2021   Diverticulitis    Family history of breast cancer    Family history of colon cancer    Family history of colon cancer    Family history of stomach cancer    Hypertension    Obesity    Sleep apnea      Family History  Problem Relation Age of Onset   Diabetes Mother    Hypertension Mother    Colon cancer Mother        dx at age 41   Breast cancer Mother    Congestive Heart Failure Father    Hypertension Father    Diabetes Maternal Grandmother    Hypertension Maternal Grandmother    Diabetes Maternal Grandfather    Kidney disease Maternal Grandfather    Diabetes Paternal Grandmother    Autism Son    Stomach cancer Maternal  Uncle    Heart disease Paternal Aunt    Colon polyps Half-Sister        colectomy   Ovarian cancer Neg Hx    Endometrial cancer Neg Hx    Pancreatic cancer Neg Hx    Prostate cancer Neg Hx      Current Outpatient Medications:    ALPRAZolam (XANAX) 0.25 MG tablet, Take 1 tablet (0.25 mg total) by mouth at bedtime as needed for anxiety., Disp: 30 tablet, Rfl: 0   amLODipine (NORVASC) 10 MG tablet, TAKE 1 TABLET BY MOUTH EVERY DAY, Disp: 90 tablet, Rfl: 3   HYDROcodone-Acetaminophen 5-300 MG TABS, Take 1 tablet every 6 hours by oral route., Disp: , Rfl:    KLOR-CON M20 20 MEQ tablet, TAKE 1 TABLET BY MOUTH EVERY DAY, Disp: 90 tablet, Rfl: 1   levonorgestrel (MIRENA, 52 MG,) 20 MCG/DAY IUD, Take 1 device by intrauterine route., Disp: , Rfl:    lidocaine-prilocaine (EMLA) cream, Apply 1 Application topically as needed., Disp: 30 g, Rfl: 3   POTASSIUM PO, Take by mouth., Disp: , Rfl:    pregabalin (LYRICA) 75 MG capsule, Take 1 capsule (75 mg total) by mouth at bedtime. X 1 week, then 75 mg BID x 1 week, then can increase to  75 in AM and 150 mg QHS- nerve pain, Disp: 90 capsule, Rfl: 5   sulfamethoxazole-trimethoprim (BACTRIM DS) 800-160 MG tablet, TAKE 1 TABLET(S) EVERY 12 HOURS BY ORAL ROUTE FOR 3 DAYS., Disp: , Rfl:    traZODone (DESYREL) 50 MG tablet, TAKE 1-2 TABLETS BY MOUTH AT BEDTIME AS NEEDED FOR SLEEP., Disp: 180 tablet, Rfl: 1   furosemide (LASIX) 20 MG tablet, TAKE 1 TABLET BY MOUTH EVERY DAY AS NEEDED (Patient not taking: Reported on 11/30/2022), Disp: 90 tablet, Rfl: 1   Metoprolol Tartrate 75 MG TABS, Take 1 tablet (75 mg total) by mouth 2 (two) times daily. (Patient not taking: Reported on 11/23/2022), Disp: 90 tablet, Rfl: 3   oxyCODONE-acetaminophen (PERCOCET) 7.5-325 MG tablet, Take 1 tablet by mouth every 8 (eight) hours as needed for severe pain. (Patient not taking: Reported on 11/30/2022), Disp: 30 tablet, Rfl: 0 No current facility-administered medications for this  visit.  Facility-Administered Medications Ordered in Other Visits:    fentaNYL (SUBLIMAZE) injection, , Intravenous, PRN, Mir, Al Corpus, MD, 50 mcg at 10/02/21 1436   midazolam (VERSED) injection, , Intravenous, PRN, Mir, Al Corpus, MD, 1 mg at 10/02/21 1436   Allergies  Allergen Reactions   Chlorhexidine Gluconate Hives and Itching    CHG wipes cause itching and hives requiring benadryl   Shellfish Allergy Hives   Penicillins Hives and Rash    Has patient had a PCN reaction causing immediate rash, facial/tongue/throat swelling, SOB or lightheadedness with hypotension: No Has patient had a PCN reaction causing severe rash involving mucus membranes or skin necrosis: hives Has patient had a PCN reaction that required hospitalization No Has patient had a PCN reaction occurring within the last 10 years: yes If all of the above answers are "NO", then may proceed with Cephalosporin use.       The patient states she uses none for birth control. Patient's last menstrual period was 11/30/2022 (exact date).. Negative for Dysmenorrhea. Negative for: breast discharge, breast lump(s), breast pain and breast self exam. Associated symptoms include abnormal vaginal bleeding. Pertinent negatives include abnormal bleeding (hematology), anxiety, decreased libido, depression, difficulty falling sleep, dyspareunia, history of infertility, nocturia, sexual dysfunction, sleep disturbances, urinary incontinence, urinary urgency, vaginal discharge and vaginal itching. Diet regular.The patient states her exercise level is  intermittent, she is planning to resume a regular exercise regimen.   . The patient's tobacco use is:  Social History   Tobacco Use  Smoking Status Never  Smokeless Tobacco Never  . She has been exposed to passive smoke. The patient's alcohol use is:  Social History   Substance and Sexual Activity  Alcohol Use Not Currently   Comment: Occ   Review of Systems  Constitutional: Negative.    HENT: Negative.    Eyes: Negative.   Respiratory: Negative.    Cardiovascular: Negative.   Gastrointestinal: Negative.   Endocrine: Negative.   Genitourinary: Negative.   Musculoskeletal: Negative.   Skin: Negative.   Allergic/Immunologic: Negative.   Neurological: Negative.   Hematological: Negative.   Psychiatric/Behavioral: Negative.       Today's Vitals   11/30/22 1543  BP: 130/82  Pulse: 95  Temp: 98.6 F (37 C)  SpO2: 98%  Weight: 290 lb 9.6 oz (131.8 kg)  Height: 5\' 3"  (1.6 m)   Body mass index is 51.48 kg/m.  Wt Readings from Last 3 Encounters:  11/30/22 290 lb 9.6 oz (131.8 kg)  11/24/22 280 lb 8 oz (127.2 kg)  11/23/22 280 lb (127 kg)  BP Readings from Last 3 Encounters:  11/30/22 130/82  11/24/22 130/78  11/23/22 110/82    Objective:  Physical Exam Vitals and nursing note reviewed.  Constitutional:      Appearance: Normal appearance. She is obese.  HENT:     Head: Normocephalic and atraumatic.     Right Ear: Tympanic membrane, ear canal and external ear normal.     Left Ear: Tympanic membrane, ear canal and external ear normal.     Nose: Nose normal.     Mouth/Throat:     Mouth: Mucous membranes are moist.     Pharynx: Oropharynx is clear.  Eyes:     Extraocular Movements: Extraocular movements intact.     Conjunctiva/sclera: Conjunctivae normal.     Pupils: Pupils are equal, round, and reactive to light.  Cardiovascular:     Rate and Rhythm: Normal rate and regular rhythm.     Pulses: Normal pulses.     Heart sounds: Normal heart sounds.  Pulmonary:     Effort: Pulmonary effort is normal.     Breath sounds: Normal breath sounds.  Abdominal:     General: Abdomen is flat. Bowel sounds are normal.     Palpations: Abdomen is soft.     Comments: Colostomy LLQ, obese, soft. Difficult to assess organomegaly.  Genitourinary:    Comments: deferred Musculoskeletal:        General: Normal range of motion.     Cervical back: Normal range of  motion and neck supple.     Right lower leg: Edema present.     Left lower leg: Edema present.  Skin:    General: Skin is warm and dry.  Neurological:     General: No focal deficit present.     Mental Status: She is alert and oriented to person, place, and time.  Psychiatric:        Mood and Affect: Mood normal.        Behavior: Behavior normal.         Assessment And Plan:     1. Encounter for annual health examination Comments: A full exam was performed. Importance of monthly self breast exams was discussed with the patient.  PATIENT IS ADVISED TO GET 30-45 MINUTES REGULAR EXERCISE NO LESS THAN FOUR TO FIVE DAYS PER WEEK - BOTH WEIGHTBEARING EXERCISES AND AEROBIC ARE RECOMMENDED.  PATIENT IS ADVISED TO FOLLOW A HEALTHY DIET WITH AT LEAST SIX FRUITS/VEGGIES PER DAY, DECREASE INTAKE OF RED MEAT, AND TO INCREASE FISH INTAKE TO TWO DAYS PER WEEK.  MEATS/FISH SHOULD NOT BE FRIED, BAKED OR BROILED IS PREFERABLE.  IT IS ALSO IMPORTANT TO CUT BACK ON YOUR SUGAR INTAKE. PLEASE AVOID ANYTHING WITH ADDED SUGAR, CORN SYRUP OR OTHER SWEETENERS. IF YOU MUST USE A SWEETENER, YOU CAN TRY STEVIA. IT IS ALSO IMPORTANT TO AVOID ARTIFICIALLY SWEETENERS AND DIET BEVERAGES. LASTLY, I SUGGEST WEARING SPF 50 SUNSCREEN ON EXPOSED PARTS AND ESPECIALLY WHEN IN THE DIRECT SUNLIGHT FOR AN EXTENDED PERIOD OF TIME.  PLEASE AVOID FAST FOOD RESTAURANTS AND INCREASE YOUR WATER INTAKE. - Lipid panel - Hemoglobin A1c - Hepatitis C antibody  2. Essential hypertension Comments: Chronic, controlled. EKG performed, NSR w/ HR 88.  She will c/w metoprolol 75mg  twice daily and decrease amlodipine to 10mg  1/2 tab daily. F/u 3 wks NV. She is encouraged to follow a low sodium diet.  - POCT Urinalysis Dipstick (81002) - Microalbumin / Creatinine Urine Ratio - EKG 12-Lead  3. Malignant neoplasm of sigmoid colon [facility]) Comments: Most recent  Oncology note reviewed. She is now on immunotherapy with Keytruda.  4. Urinary tract  infection without hematuria, site unspecified Comments: Urinalysis is suggestive of UTI. I will send of urine culture. - Culture, Urine  5. Class 3 severe obesity due to excess calories with serious comorbidity and body mass index (BMI) of 50.0 to 59.9 in adult [facility]) Comments: BMI 51.  She is encouraged to slowly incorporate more exercise, including strength training into her daily routine.   Return in 3 weeks (on 12/21/2022), or NV bp check, for 1 year physical, 6 month bp. Patient was given opportunity to ask questions. Patient verbalized understanding of the plan and was able to repeat key elements of the plan. All questions were answered to their satisfaction.   I, Gwynneth Aliment, MD, have reviewed all documentation for this visit. The documentation on 11/30/22 for the exam, diagnosis, procedures, and orders are all accurate and complete.

## 2022-11-30 NOTE — Patient Instructions (Signed)

## 2022-12-01 ENCOUNTER — Encounter: Payer: Self-pay | Admitting: Internal Medicine

## 2022-12-01 LAB — HEPATITIS C ANTIBODY: Hep C Virus Ab: NONREACTIVE

## 2022-12-01 LAB — LIPID PANEL
Chol/HDL Ratio: 3.4 ratio (ref 0.0–4.4)
Cholesterol, Total: 205 mg/dL — ABNORMAL HIGH (ref 100–199)
HDL: 60 mg/dL (ref >39)
LDL Chol Calc (NIH): 124 mg/dL — ABNORMAL HIGH (ref 0–99)
Triglycerides: 121 mg/dL (ref 0–149)
VLDL Cholesterol Cal: 21 mg/dL (ref 5–40)

## 2022-12-01 LAB — MICROALBUMIN / CREATININE URINE RATIO
Creatinine, Urine: 123.4 mg/dL
Microalb/Creat Ratio: 62 mg/g creat — ABNORMAL HIGH (ref 0–29)
Microalbumin, Urine: 76.2 ug/mL

## 2022-12-01 LAB — HEMOGLOBIN A1C
Est. average glucose Bld gHb Est-mCnc: 114 mg/dL
Hgb A1c MFr Bld: 5.6 % (ref 4.8–5.6)

## 2022-12-02 DIAGNOSIS — N390 Urinary tract infection, site not specified: Secondary | ICD-10-CM | POA: Insufficient documentation

## 2022-12-02 LAB — URINE CULTURE

## 2022-12-15 ENCOUNTER — Inpatient Hospital Stay: Payer: Managed Care, Other (non HMO)

## 2022-12-15 ENCOUNTER — Inpatient Hospital Stay: Payer: Managed Care, Other (non HMO) | Attending: Hematology

## 2022-12-15 VITALS — BP 141/84 | HR 73 | Temp 99.2°F | Resp 15 | Wt 295.5 lb

## 2022-12-15 DIAGNOSIS — C786 Secondary malignant neoplasm of retroperitoneum and peritoneum: Secondary | ICD-10-CM

## 2022-12-15 DIAGNOSIS — Z515 Encounter for palliative care: Secondary | ICD-10-CM

## 2022-12-15 DIAGNOSIS — Z5112 Encounter for antineoplastic immunotherapy: Secondary | ICD-10-CM | POA: Insufficient documentation

## 2022-12-15 DIAGNOSIS — D509 Iron deficiency anemia, unspecified: Secondary | ICD-10-CM

## 2022-12-15 DIAGNOSIS — Z452 Encounter for adjustment and management of vascular access device: Secondary | ICD-10-CM

## 2022-12-15 DIAGNOSIS — C187 Malignant neoplasm of sigmoid colon: Secondary | ICD-10-CM | POA: Insufficient documentation

## 2022-12-15 LAB — CMP (CANCER CENTER ONLY)
ALT: 8 U/L (ref 0–44)
AST: 11 U/L — ABNORMAL LOW (ref 15–41)
Albumin: 3.7 g/dL (ref 3.5–5.0)
Alkaline Phosphatase: 51 U/L (ref 38–126)
Anion gap: 5 (ref 5–15)
BUN: 13 mg/dL (ref 6–20)
CO2: 27 mmol/L (ref 22–32)
Calcium: 8.9 mg/dL (ref 8.9–10.3)
Chloride: 109 mmol/L (ref 98–111)
Creatinine: 0.78 mg/dL (ref 0.44–1.00)
GFR, Estimated: >60 mL/min (ref >60)
Glucose, Bld: 89 mg/dL (ref 70–99)
Potassium: 4 mmol/L (ref 3.5–5.1)
Sodium: 141 mmol/L (ref 135–145)
Total Bilirubin: 0.3 mg/dL (ref 0.3–1.2)
Total Protein: 7.2 g/dL (ref 6.5–8.1)

## 2022-12-15 LAB — CBC WITH DIFFERENTIAL (CANCER CENTER ONLY)
Abs Immature Granulocytes: 0.02 10*3/uL (ref 0.00–0.07)
Basophils Absolute: 0.1 10*3/uL (ref 0.0–0.1)
Basophils Relative: 1 %
Eosinophils Absolute: 0.1 10*3/uL (ref 0.0–0.5)
Eosinophils Relative: 2 %
HCT: 34.8 % — ABNORMAL LOW (ref 36.0–46.0)
Hemoglobin: 11.4 g/dL — ABNORMAL LOW (ref 12.0–15.0)
Immature Granulocytes: 0 %
Lymphocytes Relative: 32 %
Lymphs Abs: 1.8 10*3/uL (ref 0.7–4.0)
MCH: 28.1 pg (ref 26.0–34.0)
MCHC: 32.8 g/dL (ref 30.0–36.0)
MCV: 85.9 fL (ref 80.0–100.0)
Monocytes Absolute: 0.4 10*3/uL (ref 0.1–1.0)
Monocytes Relative: 7 %
Neutro Abs: 3.3 10*3/uL (ref 1.7–7.7)
Neutrophils Relative %: 58 %
Platelet Count: 264 10*3/uL (ref 150–400)
RBC: 4.05 MIL/uL (ref 3.87–5.11)
RDW: 13.9 % (ref 11.5–15.5)
WBC Count: 5.6 10*3/uL (ref 4.0–10.5)
nRBC: 0 % (ref 0.0–0.2)

## 2022-12-15 LAB — PREGNANCY, URINE: Preg Test, Ur: NEGATIVE

## 2022-12-15 MED ORDER — HEPARIN SOD (PORK) LOCK FLUSH 100 UNIT/ML IV SOLN
500.0000 [IU] | Freq: Once | INTRAVENOUS | Status: AC | PRN
  Administered 2022-12-15: 500 [IU]

## 2022-12-15 MED ORDER — ALTEPLASE 2 MG IJ SOLR
2.0000 mg | Freq: Once | INTRAMUSCULAR | Status: AC | PRN
  Administered 2022-12-15: 2 mg
  Filled 2022-12-15: qty 2

## 2022-12-15 MED ORDER — SODIUM CHLORIDE 0.9% FLUSH
10.0000 mL | Freq: Once | INTRAVENOUS | Status: AC | PRN
  Administered 2022-12-15: 10 mL

## 2022-12-15 MED ORDER — SODIUM CHLORIDE 0.9 % IV SOLN: Freq: Once | INTRAVENOUS | Status: AC

## 2022-12-15 MED ORDER — SODIUM CHLORIDE 0.9 % IV SOLN
200.0000 mg | Freq: Once | INTRAVENOUS | Status: AC
  Administered 2022-12-15: 200 mg via INTRAVENOUS
  Filled 2022-12-15: qty 200

## 2022-12-15 MED ORDER — SODIUM CHLORIDE 0.9% FLUSH
10.0000 mL | INTRAVENOUS | Status: DC | PRN
  Administered 2022-12-15: 10 mL

## 2022-12-15 NOTE — Patient Instructions (Signed)
[facility]  Discharge Instructions: Thank you for choosing [facility] to provide your oncology and hematology care.   If you have a lab appointment with the Cancer Center, please go directly to the Cancer Center and check in at the registration area.   Wear comfortable clothing and clothing appropriate for easy access to any Portacath or PICC line.   We strive to give you quality time with your provider. You may need to reschedule your appointment if you arrive late (15 or more minutes).  Arriving late affects you and other patients whose appointments are after yours.  Also, if you miss three or more appointments without notifying the office, you may be dismissed from the clinic at the provider's discretion.      For prescription refill requests, have your pharmacy contact our office and allow 72 hours for refills to be completed.    Today you received the following chemotherapy and/or immunotherapy agents: Keytruda      To help prevent nausea and vomiting after your treatment, we encourage you to take your nausea medication as directed.  BELOW ARE SYMPTOMS THAT SHOULD BE REPORTED IMMEDIATELY: *FEVER GREATER THAN 100.4 F (38 C) OR HIGHER *CHILLS OR SWEATING *NAUSEA AND VOMITING THAT IS NOT CONTROLLED WITH YOUR NAUSEA MEDICATION *UNUSUAL SHORTNESS OF BREATH *UNUSUAL BRUISING OR BLEEDING *URINARY PROBLEMS (pain or burning when urinating, or frequent urination) *BOWEL PROBLEMS (unusual diarrhea, constipation, pain near the anus) TENDERNESS IN MOUTH AND THROAT WITH OR WITHOUT PRESENCE OF ULCERS (sore throat, sores in mouth, or a toothache) UNUSUAL RASH, SWELLING OR PAIN  UNUSUAL VAGINAL DISCHARGE OR ITCHING   Items with * indicate a potential emergency and should be followed up as soon as possible or go to the Emergency Department if any problems should occur.  Please show the CHEMOTHERAPY ALERT CARD or IMMUNOTHERAPY ALERT CARD at  check-in to the Emergency Department and triage nurse.  Should you have questions after your visit or need to cancel or reschedule your appointment, please contact [facility]  Dept: [phone]  and follow the prompts.  Office hours are 8:00 a.m. to 4:30 p.m. Monday - Friday. Please note that voicemails left after 4:00 p.m. may not be returned until the following business day.  We are closed weekends and major holidays. You have access to a nurse at all times for urgent questions. Please call the main number to the clinic Dept: [phone] and follow the prompts.   For any non-urgent questions, you may also contact your provider using MyChart. We now offer e-Visits for anyone 18 and older to request care online for non-urgent symptoms. For details visit [email or web address].   Also download the MyChart app! Go to the app store, search "MyChart", open the app, select [facility], and log in with your MyChart username and password.   

## 2022-12-20 ENCOUNTER — Encounter: Payer: Managed Care, Other (non HMO) | Admitting: Physical Medicine and Rehabilitation

## 2022-12-21 ENCOUNTER — Ambulatory Visit: Payer: Managed Care, Other (non HMO)

## 2022-12-21 ENCOUNTER — Encounter: Payer: Self-pay | Admitting: Hematology

## 2022-12-21 VITALS — BP 136/70 | HR 69 | Temp 98.5°F | Ht 63.0 in | Wt 295.0 lb

## 2022-12-21 DIAGNOSIS — I10 Essential (primary) hypertension: Secondary | ICD-10-CM

## 2022-12-21 NOTE — Progress Notes (Signed)
Patient presents today for a bp check, patient currently taking amLODipine 10mg  AM,  metoprolol tartate 75mg - patient takes 1/2 tablet in the AM and PM. BP Readings from Last 3 Encounters:  12/21/22 136/70  12/15/22 (!) 141/84  11/30/22 130/82  Per provider- much better continue with current meds. F/u next appt.

## 2022-12-21 NOTE — Telephone Encounter (Signed)
Connected with KARA MELCHING, [phone] (home) regarding [facility] forms process.  Advised of [facility] cover sheet, [facility] Authorization form, 7-10 day (14-calendar) process turnover time frame.   "Will bring form in before or after my 3:45 pm appointment today." Currently no further questions or needs.  Awaiting form delivery.

## 2023-01-04 ENCOUNTER — Telehealth: Payer: Self-pay

## 2023-01-04 NOTE — Progress Notes (Unsigned)
[facility]   [phone] [phone]   Clinic Follow up Note   Patient Care Team: [name], MD as PCP - General (Internal Medicine) [name], MD as PCP - Cardiology (Cardiology) [name], [name] as Physician Assistant (Emergency Medicine) Malachy Mood, MD as Consulting Physician (Oncology) [name], MD as Consulting Physician (General Surgery) [name], [name], MD as Consulting Physician (Neurology) [name], [name], MD as Consulting Physician (Obstetrics and Gynecology) [name], MD as Consulting Physician (Gastroenterology) [name], [name], NP as Nurse Practitioner (Nurse Practitioner)  Date of Service:  01/05/2023  CHIEF COMPLAINT: f/u of metastatic colon cancer   CURRENT THERAPY:  Pembrolizumab q21d   ASSESSMENT:  Mikayla Rios is a 37 y.o. female with   Malignant neoplasm of sigmoid colon (HCC) -MMR loss of [name]6, [name] with high mutation burden, acquired BRCA mutation (+)  --Initially presented with diarrhea 11/04/20, found to have diverticulitis with perforation and also developed an abscess requiring 2 drains, multiple hospital stay  --she started FOLFOX on 06/22/21 but did not respond -she switched to [facility] on 09/08/21. She has been tolerating well overall but has developed joint pain which could be related, overall manageable. She is clinically doing better also with more energy and less abdominal pain   -her CEA has normalized on immunotherapy. -CT CAP on 03/15/22 again showed mixed response -she had PET scan on 04/26/2022 for evaluation of her ovarian cysts, and saw GYN Dr. [name], we decided to monitor it. -She tolerating Keytruda well, with mild joint pain.  She recently started workout at the gym, which has helped her joint pain. -restaging CT from 08/04/2022 showed stable disease in left abdomen and pelvis, no new lesions.  I personally reviewed her scan images, and discussed  findings with patient.   -Will continue Keytruda  -will repeat CT scan in 3 weeks      PLAN: -lab reviewed- hg 12.4 -will continue Keytruda - I order CT  CAP scan in 2-3 weeks  -lab/flushand f/u and treatment 8/21   SUMMARY OF ONCOLOGIC HISTORY: Oncology History Overview Note   Cancer Staging  Malignant neoplasm of sigmoid colon [facility]) Staging form: Colon and Rectum, AJCC 8th Edition - Pathologic stage from 04/15/2021: Stage IIC (pT4b, pN0, cM0) - Signed by Malachy Mood, MD on 06/12/2021    Malignant neoplasm of sigmoid colon [facility])   Initial Diagnosis   Malignant neoplasm of sigmoid colon (HCC)   11/04/2020 Imaging   EXAM: CT ABDOMEN AND PELVIS WITHOUT CONTRAST  IMPRESSION: 1. Perforating descending colonic diverticulitis with multiple abdominopelvic gas and fluid collections, measuring up to 5.7 cm and further described above. 2. Cholelithiasis without findings of acute cholecystitis. 3. 3.6 cm benign left adrenal adenoma. 4. Leiomyomatous uterus. 5. 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   11/07/2020 Imaging   EXAM: CT ABDOMEN AND PELVIS WITHOUT CONTRAST  IMPRESSION: Continued wall thickening is seen involving descending colon suggesting infectious or inflammatory colitis or perforated diverticulitis. There is an adjacent fluid collection measuring 7.0 x 5.0 cm consistent with abscess which is significantly enlarged compared to prior exam. Adjacent to the abscess, there is a severely thickened small bowel loop most consistent with secondary inflammation.   5.2 x 3.5 cm fluid collection is noted within the left psoas muscle consistent with abscess which is significantly enlarged compared to prior exam. 4.4 x 3.8 cm fluid collection consistent with abscess is noted in  the left retroperitoneal region which is also enlarged compared to prior exam.   Fibroid uterus.    Cholelithiasis.   11/27/2020 Imaging   EXAM: CT ABDOMEN AND PELVIS WITHOUT CONTRAST  IMPRESSION: 1. Significantly interval decreased size of the previously visualized left retroperitoneal and left anterior mid abdominal fluid collections. There is persistent fat stranding and mural thickening of the mid descending colon at this level. 2. Unchanged leiomyomatous uterus. 3. Unchanged cholelithiasis.   12/11/2020 Imaging   EXAM: CT ABDOMEN AND PELVIS WITHOUT CONTRAST  IMPRESSION: 1. Stable inflammatory changes centered around the distal descending colon in the left lower abdomen. Pericolonic inflammatory changes have minimally changed since 11/27/2020. Small pocket of gas medial to the colon is probably associated with a fistula or small residual abscess collection. 2. Stable position of the two percutaneous drains. The more anterior drain may be extending through a portion of the small bowel. 3. Cholelithiasis. 4. Fibroid uterus. Cannot exclude an ovarian/adnexal cystic structure near the uterine fundus. 5. Left adrenal adenoma.   01/07/2021 Imaging   EXAM: CT ABDOMEN AND PELVIS WITH CONTRAST  IMPRESSION: 1. No new abscesses identified. Similar degree of soft tissue thickening seen in the left pericolonic region. The degree of persistent soft tissues thickening in the pericolonic space further raises suspicions for malignancy. Further evaluation with colonoscopy should be performed. 2. Left retroperitoneal abscess drain unchanged in position. Anterior left abdominal drain again seen terminating within small bowel loop. 3. 2.6 cm mildly sclerotic lesion noted in the L2 vertebral body. Further evaluation with contrast enhanced lumbar spine MRI should be performed.   02/04/2021 Imaging   EXAM: CT ABDOMEN AND PELVIS WITH CONTRAST  IMPRESSION: No new abdominopelvic collections or abscess development in the 1 month interval.   Left anterior drain remains within a loop of small  bowel, unchanged.   Left lateral abscess drain remains in the retroperitoneal space adjacent to the iliopsoas muscle with a small amount of residual fluid and air but no measurable collection.   Stable soft tissue prominence and pericolonic strandy edema/inflammation about the left descending colon compatible with residual diverticulitis/colitis. Difficult to exclude underlying transmural lesion.   04/01/2021 Imaging   EXAM: CT ABDOMEN AND PELVIS WITH CONTRAST  IMPRESSION: There appears to be significant enhancement and wall thickening involving the descending colon with some degree of traction and involvement of adjacent small bowel loops. This is consistent with the history of diverticulitis and perforation. Stable position of percutaneous drainage catheter is seen adjacent to left psoas muscle with no significant residual fluid remaining. The other percutaneous drainage catheter that was previously noted to be within small bowel loop on prior exam in the left lower quadrant, has significantly retracted and appears to be outside of the peritoneal space at this time.   Since the prior exam, there does appear to be some degree of rotation involving mesenteric vessels and structures in the right lower quadrant, suggesting partial volvulus or malrotation. There is the interval development of several lymph nodes in this area, most likely inflammatory or reactive in etiology. Mild amount of free fluid is also noted in the pelvis. However, no significant bowel wall thickening or dilatation is seen in this area. These results will be called to the ordering clinician or representative by the Radiologist Assistant, and communication documented in the PACS or zVision Dashboard.   Stable uterine fibroid.   Hepatic steatosis.   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   Cholelithiasis.   04/10/2021 Imaging   EXAM: CT ANGIOGRAPHY CHEST  CT ABDOMEN AND PELVIS WITH CONTRAST  IMPRESSION: 1.  Again seen are findings compatible with descending colon diverticulitis with perforation. Free air and inflammation have increased in the interval. 2. New lobulated enhancing fluid collection posterior to this segment of inflamed colon measuring 8.5 x 3.5 x 10.0 cm. This collection now invades the adjacent iliopsoas muscle as well as extends through the left lateral abdominal wall. The tip of the drainage catheter is in this collection. Findings are compatible with abscess. 3. Anterior left percutaneous drainage catheter tip has been pulled back and is now within the subcutaneous tissues. 4. Trace free fluid. 5. No pulmonary embolism.  No acute cardiopulmonary process. 6. Cholelithiasis. 7. Fatty infiltration of the liver.   04/15/2021 Definitive Surgery   FINAL MICROSCOPIC DIAGNOSIS:   A. SMALL BOWEL, RESECTION:  - Adenocarcinoma.  - No carcinoma identified in 1 lymph node.   B. PERFORATED LEFT COLON, RESECTION:  - Moderately differentiated colonic adenocarcinoma.  - Tumor extends into pericolonic adipose tissue, and is strongly  suggestive of invasion into small bowel.  See oncology table/comments.  - No carcinoma identified in 4 lymph nodes (0/4).  - Tubular adenoma with high-grade dysplasia, 1.  - Tubular adenomas with low grade dysplasia, 3.   Comments: The size of the tumor is difficult to estimate secondary to the disrupted nature of the specimen, as well as the infiltrative nature of the tumor.  Tumor can be identified as definitely invading into the pericolonic adipose tissue (block B4), but given the extreme disruption of the tissue, and the involvement of the small bowel by what appears to be a colonic adenocarcinoma, I favor perforation of the large bowel with direct invasion into the small bowel.  Accordingly, I believe this is best regarded as a pT4b lesion.   ADDENDUM:  Mismatch Repair Protein (IHC)  SUMMARY INTERPRETATION: ABNORMAL  There is loss of the major MMR  protein [name]6: This indicates a high probability that a hereditary germline mutation is present and referral to genetic counseling is warranted. It is recommended that the loss of protein expression be correlated with molecular based microsatellite instability testing.   IHC EXPRESSION RESULTS  TEST           RESULT  MLH1:          Preserved nuclear expression  [name]2:          Preserved nuclear expression  [name]6:          LOSS OF NUCLEAR EXPRESSION  PMS2:          Preserved nuclear expression    04/15/2021 Cancer Staging   Staging form: Colon and Rectum, AJCC 8th Edition - Pathologic stage from 04/15/2021: Stage IIC (pT4b, pN0, cM0) - Signed by Malachy Mood, MD on 06/12/2021 Stage prefix: Initial diagnosis Total positive nodes: 0 Histologic grading system: 4 grade system Histologic grade (G): G2 Residual tumor (R): R0 - None   04/29/2021 Imaging   EXAM: CT ABDOMEN AND PELVIS WITH CONTRAST  IMPRESSION: Continued left retroperitoneal abscesses inferior to the left kidney, involving the left psoas muscle and left abdominal wall musculature. Overall size is decreased since prior study. Interval removal of left lower quadrant abscess drainage catheter.   New fluid collection in the cul-de-sac and wrapping around the uterus concerning for abscess.   Cholelithiasis.   Small left pleural effusion.  Bibasilar atelectasis.   Stable left adrenal adenoma.  ADDENDUM: After discussing the case with Dr. Bryn Gulling in interventional radiology, it was noted that the fluid in  the pelvis originally thought to be in the cul-de-sac is likely within the vagina, best seen on sagittal imaging. Recommend speculum exam.   Also, in the left lateral wall in the area of prior abscess in the lateral wall abdominal musculature, some of the abdomen soft tissue appears to be enhancing. While this could be infectious, cannot exclude tumor seeding in the left lateral abdominal wall, measuring 6.9 x 3.8 cm on image 64 of  series 2.   06/16/2021 Imaging   EXAM: CT ABDOMEN AND PELVIS WITH CONTRAST  IMPRESSION: Increased size of bulky soft tissue masses involving the left psoas muscle and left lateral abdominal wall soft tissues, consistent with progressive metastatic disease.   Significant progression of multiple peritoneal masses throughout the abdomen and pelvis, consistent with peritoneal carcinomatosis.   New mild retroperitoneal lymphadenopathy, consistent with metastatic disease.   Stable uterine fibroid and left adrenal adenoma.   Cholelithiasis, without evidence of cholecystitis.   06/22/2021 - 08/19/2021 Chemotherapy   Patient is on Treatment Plan : COLORECTAL FOLFOX q14d x 6 months     06/22/2021 Tumor Marker   Patient's tumor was tested for the following markers: CEA. Results of the tumor marker test revealed 87.41.   07/10/2021 Pathology Results   FINAL MICROSCOPIC DIAGNOSIS:   A. PERITONEAL MASS, LEFT UPPER ABDOMINAL QUADRANT, BIOPSY:  -  Metastatic adenocarcinoma with necrosis, histologically consistent with colon primary.     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 The report date is 07/16/2021. The remainder of testing was negative/normal.  The CancerNext-Expanded + RNAinsight gene panel offered by [employer] and includes sequencing and rearrangement analysis for the following 77 genes: IP, ALK, APC*, ATM*, AXIN2, BAP1, BARD1, BLM, BMPR1A, BRCA1*, BRCA2*, BRIP1*, CDC73, CDH1*,CDK4, CDKN1B, CDKN2A, CHEK2*, CTNNA1, DICER1, FANCC, FH, FLCN, GALNT12, KIF1B, LZTR1, MAX, MEN1, MET, MLH1*, [name]2*, [name]3, [name]6*, MUTYH*, NBN, NF1*, NF2, NTHL1, PALB2*, PHOX2B, PMS2*, POT1, PRKAR1A, PTCH1, PTEN*, RAD51C*, RAD51D*,RB1, RECQL, RET, SDHA, SDHAF2, SDHB, SDHC, SDHD, SMAD4, SMARCA4, SMARCB1, SMARCE1, STK11, SUFU, TMEM127, TP53*,TSC1, TSC2, VHL and XRCC2 (sequencing and deletion/duplication); EGFR, EGLN1, HOXB13, KIT, MITF, PDGFRA,  POLD1 and POLE (sequencing only); EPCAM and GREM1 (deletion/duplication only).   08/28/2021 Imaging   EXAM: CT CHEST, ABDOMEN, AND PELVIS WITH CONTRAST  IMPRESSION: 1. Continued interval progression of multiple metastatic soft tissue nodules/masses in the abdomen and pelvis. 2. No evidence for metastatic disease in the chest. 3. Similar appearance of the subtle lesion in the L2 vertebral body, suspicious for metastatic involvement. 4. Cholelithiasis.   09/03/2021 -  Chemotherapy   Patient is on Treatment Plan : COLORECTAL Pembrolizumab (200) q21d     09/08/2021 - 02/01/2022 Chemotherapy   Patient is on Treatment Plan : COLORECTAL Pembrolizumab (200) q21d     01/08/2022 Imaging   CT CAP IMPRESSION: 1. Mixed response to therapy. Interval decrease in size of the conglomerate multilobulated mass extending from the left psoas into the left posterior pararenal space through the left lateral abdominal wall along the tract of the previously placed percutaneous drainage catheter. Interval decrease in size of right adnexal mass and omental masses within the right lower quadrant of the abdomen. Interval development of peritoneal carcinomatosis with omental caking within the left lower quadrant of the abdomen and left subdiaphragmatic region adjacent to the spleen and along the left pericolic gutter as well as development of ascites appearing loculated within the anterior peritoneum. 2. Interval development of mild left hydronephrosis secondary to stricturing of the mid left  ureter. 3. Stable left adrenal metastasis. 4. Stable sclerotic lesion within the L2 vertebral body. No new lytic or blastic bone lesions are seen. 5. Cholelithiasis. 6. No evidence of intrathoracic metastatic disease. 7. 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 Moderate colonic stool burden. No evidence of obstruction.   03/15/2022 Imaging    IMPRESSION: 1. Status post sigmoid colon resection with left lower quadrant end colostomy. 2. Multicystic bilateral ovarian lesions are slightly increased in size. 3. Diminished, small volume of loculated appearing ascites throughout the abdomen and pelvis, with similar appearance of peritoneal thickening and nodularity throughout. 4. Heterogeneously calcified, conglomerate masses in the left retroperitoneum and abdominal wall are slightly diminished in size. 5. Left adrenal lesion not significantly changed. 6. Unchanged faintly sclerotic metastatic lesion of L2. 7. Constellation of findings is consistent with mixed response to treatment however with overall little interval change. 8. Minimal residual left hydronephrosis and proximal hydroureter, improved compared to prior examination. 9. Cholelithiasis. 10. Coronary artery disease, significantly advanced for patient age.   08/04/2022 Imaging    IMPRESSION: CT CHEST:   1. No developing mass lesion, fluid collection or lymph node enlargement in the thorax. Stable tiny 3 mm lung nodule. 2. Coronary artery calcifications. Please correlate for other coronary risk factors. 3. Chest port   CT ABDOMEN AND PELVIS:   1. Extensive surgical changes identified. Left lower quadrant ostomy, diverting colostomy. Surgical changes along loops of small bowel in the right hemiabdomen. 2. Once again there are areas of nodular tissue extending along the left lateral abdominal wall, into the retroperitoneum which are similar to previous. The amount of adjacent fluid in these locations has improved. 3. No new mass lesion, fluid collection or lymph node enlargement in the abdomen or pelvis. 4. Gallstones. 5. Enlarged uterus with fibroids. Bilateral complex cystic areas are once again seen and based on appearance differential includes dilated fallopian tubes, hydrosalpinx versus true cystic lesions. Please correlate for any known history or prior  workup 6. 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 No developing mass lesion, fluid collection or lymph node enlargement in the thorax. Stable tiny 3 mm lung nodule. 2. Coronary artery calcifications. Please correlate for other coronary risk factors. 3. Chest port   CT ABDOMEN AND PELVIS:   1. Extensive surgical changes identified. Left lower quadrant ostomy, diverting colostomy. Surgical changes along loops of small bowel in the right hemiabdomen. 2. Once again there are areas of nodular tissue extending along the left lateral abdominal wall, into the retroperitoneum which are similar to previous. The amount of adjacent fluid in these locations has improved. 3. No new mass lesion, fluid collection or lymph node enlargement in the abdomen or pelvis. 4. Gallstones. 5. Enlarged uterus with fibroids. Bilateral complex cystic areas are once again seen and based on appearance differential includes dilated fallopian tubes, hydrosalpinx versus true cystic lesions. Please correlate for any known history or prior workup 6. Limited evaluation for solid organ pathology metastatic disease without the advantage of IV contrast        INTERVAL HISTORY:  Mikayla Rios is here for a follow  up of metastatic colon cancer . She was last seen by me on 11/03/2022. She presents to the clinic accompanied by husband. Pt state that her joint pain is tolerable. Pt state that her stomach has soreness front of her abdomin like she being sit ups. Pt activity is better but not back to normal.       All other systems were reviewed with the patient and are negative.  MEDICAL  HISTORY:  Past Medical History:  Diagnosis Date   Allergy    seasonal   Anemia    Blood infection 1985   Blood transfusion without reported diagnosis    colon ca 04/2021   Diverticulitis    Family history of breast cancer    Family history of colon cancer    Family history of colon cancer    Family history of stomach cancer    Hypertension    Obesity    Sleep apnea     SURGICAL HISTORY: Past Surgical History:  Procedure Laterality Date   COLECTOMY WITH COLOSTOMY CREATION/HARTMANN PROCEDURE N/A 04/15/2021   Procedure: COLOSTOMY CREATION/HARTMANN PROCEDURE;  Surgeon: [name], MD;  Location: [facility];  Service: General;  Laterality: N/A;   IR CATHETER TUBE CHANGE  12/12/2020   IR CATHETER TUBE CHANGE  01/27/2021   IR CATHETER TUBE CHANGE  02/19/2021   IR CATHETER TUBE CHANGE  04/13/2021   IR IMAGING GUIDED PORT INSERTION  10/02/2021   IR RADIOLOGIST EVAL & MGMT  11/27/2020   IR RADIOLOGIST EVAL & MGMT  12/11/2020   IR RADIOLOGIST EVAL & MGMT  01/07/2021   IR RADIOLOGIST EVAL & MGMT  02/04/2021   IR SINUS/FIST TUBE CHK-NON GI  12/12/2020   IR SINUS/FIST TUBE CHK-NON GI  02/19/2021   LAPAROSCOPIC PARTIAL COLECTOMY N/A 04/15/2021   Procedure: LAPAROSCOPIC ASSISTED HARTMANN RESECTION;  Surgeon: [name], MD;  Location: [facility];  Service: General;  Laterality: N/A;    I have reviewed the social history and family history with the patient and they are unchanged from previous note.  ALLERGIES:  is allergic to chlorhexidine gluconate, shellfish allergy, and penicillins.  MEDICATIONS:  Current Outpatient Medications  Medication Sig Dispense Refill   ALPRAZolam (XANAX) 0.25 MG tablet Take 1 tablet (0.25 mg total) by mouth at bedtime as needed for anxiety. 30 tablet 0   amLODipine (NORVASC) 10 MG tablet TAKE 1 TABLET BY MOUTH EVERY DAY 90 tablet 3   furosemide (LASIX) 20 MG tablet TAKE 1 TABLET BY MOUTH EVERY DAY AS NEEDED (Patient not taking: Reported on 11/30/2022) 90 tablet 1    KLOR-CON M20 20 MEQ tablet TAKE 1 TABLET BY MOUTH EVERY DAY 90 tablet 1   levonorgestrel (MIRENA, 52 MG,) 20 MCG/DAY IUD Take 1 device by intrauterine route.     lidocaine-prilocaine (EMLA) cream Apply 1 Application topically as needed. 30 g 3   Metoprolol Tartrate 75 MG TABS Take 1 tablet (75 mg total) by mouth 2 (two) times daily. (Patient taking differently: Take 75 mg by mouth 2 (two) times daily. Takes 1/2 tablet by mouth 2x daily.) 90 tablet 3   POTASSIUM PO Take by mouth.     pregabalin (LYRICA) 75 MG capsule Take 1 capsule (75 mg total) by mouth at bedtime. X 1 week, then 75 mg BID x 1 week, then can increase to 75 in AM and 150 mg QHS- nerve pain 90 capsule 5   sulfamethoxazole-trimethoprim (BACTRIM DS) 800-160 MG tablet TAKE 1 TABLET(S) EVERY 12 HOURS BY ORAL ROUTE FOR 3 DAYS.  traZODone (DESYREL) 50 MG tablet TAKE 1-2 TABLETS BY MOUTH AT BEDTIME AS NEEDED FOR SLEEP. 180 tablet 1   No current facility-administered medications for this visit.   Facility-Administered Medications Ordered in Other Visits  Medication Dose Route Frequency Provider Last Rate Last Admin   fentaNYL (SUBLIMAZE) injection   Intravenous PRN Mir, Al Corpus, MD   50 mcg at 10/02/21 1436   midazolam (VERSED) injection   Intravenous PRN Mir, Al Corpus, MD   1 mg at 10/02/21 1436   sodium chloride flush (NS) 0.9 % injection 10 mL  10 mL Intracatheter PRN Malachy Mood, MD   10 mL at 01/05/23 1342    PHYSICAL EXAMINATION: ECOG PERFORMANCE STATUS: 1 - Symptomatic but completely ambulatory  Vitals:   01/05/23 1225  BP: (!) 143/94  Pulse: 85  Resp: 18  Temp: 98.9 F (37.2 C)  SpO2: 100%   Wt Readings from Last 3 Encounters:  01/05/23 296 lb 14.4 oz (134.7 kg)  12/21/22 295 lb (133.8 kg)  12/15/22 295 lb 8 oz (134 kg)     GENERAL:alert, no distress and comfortable SKIN: skin color normal, no rashes or significant lesions EYES: normal, Conjunctiva are pink and non-injected, sclera clear  NEURO: alert &  oriented x 3 with fluent speech LABORATORY DATA:  I have reviewed the data as listed    Latest Ref Rng & Units 01/05/2023   11:40 AM 12/15/2022    2:02 PM 11/24/2022   12:51 PM  CBC  WBC 4.0 - 10.5 K/uL 5.9  5.6  5.6   Hemoglobin 12.0 - 15.0 g/dL 16.1  09.6  04.5   Hematocrit 36.0 - 46.0 % 37.7  34.8  32.6   Platelets 150 - 400 K/uL 415  264  346         Latest Ref Rng & Units 01/05/2023   11:40 AM 12/15/2022    2:02 PM 11/24/2022   12:51 PM  CMP  Glucose 70 - 99 mg/dL 90  89  99   BUN 6 - 20 mg/dL 13  13  14    Creatinine 0.44 - 1.00 mg/dL 4.09  8.11  9.14   Sodium 135 - 145 mmol/L 138  141  138   Potassium 3.5 - 5.1 mmol/L 4.3  4.0  3.6   Chloride 98 - 111 mmol/L 108  109  111   CO2 22 - 32 mmol/L 25  27  21    Calcium 8.9 - 10.3 mg/dL 9.0  8.9  8.7   Total Protein 6.5 - 8.1 g/dL 7.5  7.2  6.6   Total Bilirubin 0.3 - 1.2 mg/dL 0.3  0.3  0.3   Alkaline Phos 38 - 126 U/L 46  51  53   AST 15 - 41 U/L 13  11  12    ALT 0 - 44 U/L 10  8  13        RADIOGRAPHIC STUDIES: I have personally reviewed the radiological images as listed and agreed with the findings in the report. No results found.    Orders Placed This Encounter  Procedures   CBC with Differential (Cancer Center Only)    Standing Status:   Future    Standing Expiration Date:   03/08/2024   CMP (Cancer Center only)    Standing Status:   Future    Standing Expiration Date:   03/08/2024   CBC with Differential (Cancer Center Only)    Standing Status:   Future    Standing Expiration Date:  03/29/2024   CMP (Cancer Center only)    Standing Status:   Future    Standing Expiration Date:   03/29/2024   T4    Standing Status:   Future    Standing Expiration Date:   03/29/2024   TSH    Standing Status:   Future    Standing Expiration Date:   03/29/2024   All questions were answered. The patient knows to call the clinic with any problems, questions or concerns. No barriers to learning was detected. The total time  spent in the appointment was 25 minutes.     Malachy Mood, MD 01/05/2023   Carolin Coy, CMA, am acting as scribe for Malachy Mood, MD.   I have reviewed the above documentation for accuracy and completeness, and I agree with the above.

## 2023-01-04 NOTE — Telephone Encounter (Signed)
Spoke with Mikayla Rios informing her that the FMLA documents for her husband had been completed and faxed to company. Fax conformation received. Copy of her documents placed up front for pick-up as requested.

## 2023-01-05 ENCOUNTER — Other Ambulatory Visit: Payer: Self-pay

## 2023-01-05 ENCOUNTER — Inpatient Hospital Stay (HOSPITAL_BASED_OUTPATIENT_CLINIC_OR_DEPARTMENT_OTHER): Payer: Managed Care, Other (non HMO) | Admitting: Hematology

## 2023-01-05 ENCOUNTER — Inpatient Hospital Stay: Payer: Managed Care, Other (non HMO)

## 2023-01-05 ENCOUNTER — Encounter: Payer: Self-pay | Admitting: Hematology

## 2023-01-05 VITALS — BP 143/94 | HR 85 | Temp 98.9°F | Resp 18 | Wt 296.9 lb

## 2023-01-05 VITALS — BP 135/75 | HR 70 | Resp 18

## 2023-01-05 DIAGNOSIS — C187 Malignant neoplasm of sigmoid colon: Secondary | ICD-10-CM

## 2023-01-05 DIAGNOSIS — Z452 Encounter for adjustment and management of vascular access device: Secondary | ICD-10-CM

## 2023-01-05 DIAGNOSIS — Z515 Encounter for palliative care: Secondary | ICD-10-CM

## 2023-01-05 DIAGNOSIS — C786 Secondary malignant neoplasm of retroperitoneum and peritoneum: Secondary | ICD-10-CM

## 2023-01-05 DIAGNOSIS — D509 Iron deficiency anemia, unspecified: Secondary | ICD-10-CM

## 2023-01-05 LAB — CMP (CANCER CENTER ONLY)
ALT: 10 U/L (ref 0–44)
AST: 13 U/L — ABNORMAL LOW (ref 15–41)
Albumin: 4.1 g/dL (ref 3.5–5.0)
Alkaline Phosphatase: 46 U/L (ref 38–126)
Anion gap: 5 (ref 5–15)
BUN: 13 mg/dL (ref 6–20)
CO2: 25 mmol/L (ref 22–32)
Calcium: 9 mg/dL (ref 8.9–10.3)
Chloride: 108 mmol/L (ref 98–111)
Creatinine: 0.71 mg/dL (ref 0.44–1.00)
GFR, Estimated: >60 mL/min (ref >60)
Glucose, Bld: 90 mg/dL (ref 70–99)
Potassium: 4.3 mmol/L (ref 3.5–5.1)
Sodium: 138 mmol/L (ref 135–145)
Total Bilirubin: 0.3 mg/dL (ref 0.3–1.2)
Total Protein: 7.5 g/dL (ref 6.5–8.1)

## 2023-01-05 LAB — CBC WITH DIFFERENTIAL (CANCER CENTER ONLY)
Abs Immature Granulocytes: 0.02 10*3/uL (ref 0.00–0.07)
Basophils Absolute: 0.1 10*3/uL (ref 0.0–0.1)
Basophils Relative: 1 %
Eosinophils Absolute: 0.1 10*3/uL (ref 0.0–0.5)
Eosinophils Relative: 2 %
HCT: 37.7 % (ref 36.0–46.0)
Hemoglobin: 12.4 g/dL (ref 12.0–15.0)
Immature Granulocytes: 0 %
Lymphocytes Relative: 32 %
Lymphs Abs: 1.9 10*3/uL (ref 0.7–4.0)
MCH: 27.9 pg (ref 26.0–34.0)
MCHC: 32.9 g/dL (ref 30.0–36.0)
MCV: 84.9 fL (ref 80.0–100.0)
Monocytes Absolute: 0.3 10*3/uL (ref 0.1–1.0)
Monocytes Relative: 5 %
Neutro Abs: 3.5 10*3/uL (ref 1.7–7.7)
Neutrophils Relative %: 60 %
Platelet Count: 415 10*3/uL — ABNORMAL HIGH (ref 150–400)
RBC: 4.44 MIL/uL (ref 3.87–5.11)
RDW: 14.2 % (ref 11.5–15.5)
WBC Count: 5.9 10*3/uL (ref 4.0–10.5)
nRBC: 0 % (ref 0.0–0.2)

## 2023-01-05 LAB — PREGNANCY, URINE: Preg Test, Ur: NEGATIVE

## 2023-01-05 MED ORDER — HEPARIN SOD (PORK) LOCK FLUSH 100 UNIT/ML IV SOLN
500.0000 [IU] | Freq: Once | INTRAVENOUS | Status: AC | PRN
  Administered 2023-01-05: 500 [IU]

## 2023-01-05 MED ORDER — SODIUM CHLORIDE 0.9% FLUSH
10.0000 mL | Freq: Once | INTRAVENOUS | Status: AC
  Administered 2023-01-05: 10 mL

## 2023-01-05 MED ORDER — SODIUM CHLORIDE 0.9 % IV SOLN
200.0000 mg | Freq: Once | INTRAVENOUS | Status: AC
  Administered 2023-01-05: 200 mg via INTRAVENOUS
  Filled 2023-01-05: qty 200

## 2023-01-05 MED ORDER — SODIUM CHLORIDE 0.9% FLUSH
10.0000 mL | INTRAVENOUS | Status: DC | PRN
  Administered 2023-01-05: 10 mL

## 2023-01-05 MED ORDER — SODIUM CHLORIDE 0.9 % IV SOLN: Freq: Once | INTRAVENOUS | Status: AC

## 2023-01-05 MED ORDER — ALTEPLASE 2 MG IJ SOLR
2.0000 mg | Freq: Once | INTRAMUSCULAR | Status: AC | PRN
  Administered 2023-01-05: 2 mg
  Filled 2023-01-05: qty 2

## 2023-01-05 NOTE — Assessment & Plan Note (Signed)
-  MMR loss of [name]6, [name] with high mutation burden, acquired BRCA mutation (+)  --Initially presented with diarrhea 11/04/20, found to have diverticulitis with perforation and also developed an abscess requiring 2 drains, multiple hospital stay  --she started FOLFOX on 06/22/21 but did not respond -she switched to [facility] on 09/08/21. She has been tolerating well overall but has developed joint pain which could be related, overall manageable. She is clinically doing better also with more energy and less abdominal pain   -her CEA has normalized on immunotherapy. -CT CAP on 03/15/22 again showed mixed response -she had PET scan on 04/26/2022 for evaluation of her ovarian cysts, and saw GYN Dr. [name], we decided to monitor it. -She tolerating Keytruda well, with mild joint pain.  She recently started workout at the gym, which has helped her joint pain. -restaging CT from 08/04/2022 showed stable disease in left abdomen and pelvis, no new lesions.  I personally reviewed her scan images, and discussed findings with patient.   -Will continue Keytruda  -will repeat CT scan in 3 weeks

## 2023-01-05 NOTE — Patient Instructions (Signed)

## 2023-01-24 ENCOUNTER — Ambulatory Visit (HOSPITAL_COMMUNITY)
Admission: RE | Admit: 2023-01-24 | Discharge: 2023-01-24 | Disposition: A | Discharge status: Home / Self Care | Payer: Managed Care, Other (non HMO) | Source: Ambulatory Visit | Attending: Hematology | Admitting: Hematology

## 2023-01-24 ENCOUNTER — Inpatient Hospital Stay: Payer: Managed Care, Other (non HMO) | Attending: Hematology

## 2023-01-24 ENCOUNTER — Other Ambulatory Visit: Payer: Self-pay

## 2023-01-24 DIAGNOSIS — D649 Anemia, unspecified: Secondary | ICD-10-CM | POA: Insufficient documentation

## 2023-01-24 DIAGNOSIS — M2550 Pain in unspecified joint: Secondary | ICD-10-CM | POA: Insufficient documentation

## 2023-01-24 DIAGNOSIS — Z452 Encounter for adjustment and management of vascular access device: Secondary | ICD-10-CM

## 2023-01-24 DIAGNOSIS — C7972 Secondary malignant neoplasm of left adrenal gland: Secondary | ICD-10-CM | POA: Insufficient documentation

## 2023-01-24 DIAGNOSIS — N83209 Unspecified ovarian cyst, unspecified side: Secondary | ICD-10-CM | POA: Insufficient documentation

## 2023-01-24 DIAGNOSIS — K8020 Calculus of gallbladder without cholecystitis without obstruction: Secondary | ICD-10-CM | POA: Insufficient documentation

## 2023-01-24 DIAGNOSIS — C7951 Secondary malignant neoplasm of bone: Secondary | ICD-10-CM | POA: Insufficient documentation

## 2023-01-24 DIAGNOSIS — K760 Fatty (change of) liver, not elsewhere classified: Secondary | ICD-10-CM | POA: Insufficient documentation

## 2023-01-24 DIAGNOSIS — C786 Secondary malignant neoplasm of retroperitoneum and peritoneum: Secondary | ICD-10-CM

## 2023-01-24 DIAGNOSIS — D259 Leiomyoma of uterus, unspecified: Secondary | ICD-10-CM | POA: Insufficient documentation

## 2023-01-24 DIAGNOSIS — C187 Malignant neoplasm of sigmoid colon: Secondary | ICD-10-CM | POA: Diagnosis present

## 2023-01-24 DIAGNOSIS — Z5112 Encounter for antineoplastic immunotherapy: Secondary | ICD-10-CM | POA: Insufficient documentation

## 2023-01-24 DIAGNOSIS — Z79899 Other long term (current) drug therapy: Secondary | ICD-10-CM | POA: Insufficient documentation

## 2023-01-24 DIAGNOSIS — D1391 Familial adenomatous polyposis: Secondary | ICD-10-CM | POA: Insufficient documentation

## 2023-01-24 DIAGNOSIS — J90 Pleural effusion, not elsewhere classified: Secondary | ICD-10-CM | POA: Insufficient documentation

## 2023-01-24 DIAGNOSIS — D3502 Benign neoplasm of left adrenal gland: Secondary | ICD-10-CM | POA: Insufficient documentation

## 2023-01-24 LAB — CMP (CANCER CENTER ONLY)
ALT: 9 U/L (ref 0–44)
AST: 12 U/L — ABNORMAL LOW (ref 15–41)
Albumin: 3.9 g/dL (ref 3.5–5.0)
Alkaline Phosphatase: 40 U/L (ref 38–126)
Anion gap: 6 (ref 5–15)
BUN: 15 mg/dL (ref 6–20)
CO2: 22 mmol/L (ref 22–32)
Calcium: 8.5 mg/dL — ABNORMAL LOW (ref 8.9–10.3)
Chloride: 110 mmol/L (ref 98–111)
Creatinine: 0.7 mg/dL (ref 0.44–1.00)
GFR, Estimated: >60 mL/min (ref >60)
Glucose, Bld: 91 mg/dL (ref 70–99)
Potassium: 3.8 mmol/L (ref 3.5–5.1)
Sodium: 138 mmol/L (ref 135–145)
Total Bilirubin: 0.3 mg/dL (ref 0.3–1.2)
Total Protein: 7.4 g/dL (ref 6.5–8.1)

## 2023-01-24 LAB — CBC WITH DIFFERENTIAL (CANCER CENTER ONLY)
Abs Immature Granulocytes: 0.01 10*3/uL (ref 0.00–0.07)
Basophils Absolute: 0 10*3/uL (ref 0.0–0.1)
Basophils Relative: 1 %
Eosinophils Absolute: 0.1 10*3/uL (ref 0.0–0.5)
Eosinophils Relative: 2 %
HCT: 35.1 % — ABNORMAL LOW (ref 36.0–46.0)
Hemoglobin: 11.5 g/dL — ABNORMAL LOW (ref 12.0–15.0)
Immature Granulocytes: 0 %
Lymphocytes Relative: 36 %
Lymphs Abs: 2 10*3/uL (ref 0.7–4.0)
MCH: 27.6 pg (ref 26.0–34.0)
MCHC: 32.8 g/dL (ref 30.0–36.0)
MCV: 84.2 fL (ref 80.0–100.0)
Monocytes Absolute: 0.3 10*3/uL (ref 0.1–1.0)
Monocytes Relative: 5 %
Neutro Abs: 3.2 10*3/uL (ref 1.7–7.7)
Neutrophils Relative %: 56 %
Platelet Count: 413 10*3/uL — ABNORMAL HIGH (ref 150–400)
RBC: 4.17 MIL/uL (ref 3.87–5.11)
RDW: 14.5 % (ref 11.5–15.5)
WBC Count: 5.7 10*3/uL (ref 4.0–10.5)
nRBC: 0 % (ref 0.0–0.2)

## 2023-01-24 MED ORDER — HEPARIN SOD (PORK) LOCK FLUSH 100 UNIT/ML IV SOLN
INTRAVENOUS | Status: AC
  Filled 2023-01-24: qty 5

## 2023-01-24 MED ORDER — SODIUM CHLORIDE 0.9% FLUSH
10.0000 mL | Freq: Once | INTRAVENOUS | Status: AC
  Administered 2023-01-24: 10 mL

## 2023-01-25 LAB — TSH: TSH: 0.935 u[IU]/mL (ref 0.350–4.500)

## 2023-01-25 LAB — T4: T4, Total: 7.3 ug/dL (ref 4.5–12.0)

## 2023-01-26 ENCOUNTER — Other Ambulatory Visit: Payer: Self-pay

## 2023-01-26 ENCOUNTER — Other Ambulatory Visit: Payer: Managed Care, Other (non HMO)

## 2023-01-26 ENCOUNTER — Encounter: Payer: Self-pay | Admitting: Hematology

## 2023-01-26 ENCOUNTER — Inpatient Hospital Stay (HOSPITAL_BASED_OUTPATIENT_CLINIC_OR_DEPARTMENT_OTHER): Payer: Managed Care, Other (non HMO) | Admitting: Hematology

## 2023-01-26 ENCOUNTER — Ambulatory Visit: Payer: Managed Care, Other (non HMO)

## 2023-01-26 ENCOUNTER — Inpatient Hospital Stay: Payer: Managed Care, Other (non HMO)

## 2023-01-26 VITALS — BP 145/80 | HR 86 | Temp 98.6°F | Resp 20 | Wt 299.7 lb

## 2023-01-26 VITALS — BP 121/86 | HR 83 | Resp 18

## 2023-01-26 DIAGNOSIS — N83209 Unspecified ovarian cyst, unspecified side: Secondary | ICD-10-CM | POA: Diagnosis not present

## 2023-01-26 DIAGNOSIS — M2550 Pain in unspecified joint: Secondary | ICD-10-CM

## 2023-01-26 DIAGNOSIS — D3502 Benign neoplasm of left adrenal gland: Secondary | ICD-10-CM | POA: Diagnosis not present

## 2023-01-26 DIAGNOSIS — C7972 Secondary malignant neoplasm of left adrenal gland: Secondary | ICD-10-CM | POA: Diagnosis not present

## 2023-01-26 DIAGNOSIS — K760 Fatty (change of) liver, not elsewhere classified: Secondary | ICD-10-CM | POA: Diagnosis not present

## 2023-01-26 DIAGNOSIS — C187 Malignant neoplasm of sigmoid colon: Secondary | ICD-10-CM

## 2023-01-26 DIAGNOSIS — Z5112 Encounter for antineoplastic immunotherapy: Secondary | ICD-10-CM | POA: Diagnosis not present

## 2023-01-26 DIAGNOSIS — C786 Secondary malignant neoplasm of retroperitoneum and peritoneum: Secondary | ICD-10-CM | POA: Diagnosis not present

## 2023-01-26 DIAGNOSIS — D500 Iron deficiency anemia secondary to blood loss (chronic): Secondary | ICD-10-CM

## 2023-01-26 DIAGNOSIS — K8020 Calculus of gallbladder without cholecystitis without obstruction: Secondary | ICD-10-CM | POA: Diagnosis not present

## 2023-01-26 DIAGNOSIS — D259 Leiomyoma of uterus, unspecified: Secondary | ICD-10-CM | POA: Diagnosis not present

## 2023-01-26 DIAGNOSIS — D1391 Familial adenomatous polyposis: Secondary | ICD-10-CM | POA: Diagnosis not present

## 2023-01-26 DIAGNOSIS — C7951 Secondary malignant neoplasm of bone: Secondary | ICD-10-CM | POA: Diagnosis not present

## 2023-01-26 DIAGNOSIS — J90 Pleural effusion, not elsewhere classified: Secondary | ICD-10-CM | POA: Diagnosis not present

## 2023-01-26 DIAGNOSIS — D649 Anemia, unspecified: Secondary | ICD-10-CM | POA: Diagnosis not present

## 2023-01-26 DIAGNOSIS — Z79899 Other long term (current) drug therapy: Secondary | ICD-10-CM | POA: Diagnosis not present

## 2023-01-26 MED ORDER — HEPARIN SOD (PORK) LOCK FLUSH 100 UNIT/ML IV SOLN
500.0000 [IU] | Freq: Once | INTRAVENOUS | Status: AC | PRN
  Administered 2023-01-26: 500 [IU]

## 2023-01-26 MED ORDER — SODIUM CHLORIDE 0.9 % IV SOLN
200.0000 mg | Freq: Once | INTRAVENOUS | Status: AC
  Administered 2023-01-26: 200 mg via INTRAVENOUS
  Filled 2023-01-26: qty 200

## 2023-01-26 MED ORDER — SODIUM CHLORIDE 0.9% FLUSH
10.0000 mL | INTRAVENOUS | Status: DC | PRN
  Administered 2023-01-26: 10 mL

## 2023-01-26 MED ORDER — SODIUM CHLORIDE 0.9 % IV SOLN: Freq: Once | INTRAVENOUS | Status: AC

## 2023-01-26 NOTE — Progress Notes (Signed)
Spoke with pt via telephone to inform pt that her CT Scan results were back/finalized.  Stated that Dr. Mosetta Putt said the results were good and as discussed today while in clinic.  The mass was reduced in size of the band of density extending through the subcutaneous tissues of the left lateral abdominal wall musculature into the left retroperitoneum, with associated calcifications. This could be from healing or interval effective therapy.  Pt was glad to hear the results and had no further questions or concerns at this time.

## 2023-01-26 NOTE — Assessment & Plan Note (Signed)
-  Probably related to Keytruda, overall mild and manageable. -She tried Lyrica but did not like it and stopped it

## 2023-01-26 NOTE — Assessment & Plan Note (Signed)
-  blood transfusion if Hg<8.0 -patient reports struggling with some constipation with taking iron pills in the past. Will give IV iron as needed to help bolster iron levels, last 02/22/22.

## 2023-01-26 NOTE — Patient Instructions (Signed)

## 2023-01-26 NOTE — Assessment & Plan Note (Signed)
-  FO showed MSI-High disease, and multiple mutations including APC, [name]6, and BRCA2 mutations.  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  -we previously discussed testing for her children when they are of age. 

## 2023-01-26 NOTE — Progress Notes (Signed)
[facility]   [phone] Fax:([phone]   Clinic Follow up Note   Patient Care Team: [name], MD as PCP - General (Internal Medicine) [name], MD as PCP - Cardiology (Cardiology) [name], [name] as Physician Assistant (Emergency Medicine) Malachy Mood, MD as Consulting Physician (Oncology) [name], MD as Consulting Physician (General Surgery) [name], [name], MD as Consulting Physician (Neurology) [name], [name], MD as Consulting Physician (Obstetrics and Gynecology) [name], MD as Consulting Physician (Gastroenterology) [name], [name], NP as Nurse Practitioner (Nurse Practitioner)  Date of Service:  01/26/2023  CHIEF COMPLAINT: f/u of metastatic colon cancer   CURRENT THERAPY:  Pembrolizumab q21d   ASSESSMENT:  Mikayla Rios is a 37 y.o. female with   Malignant neoplasm of sigmoid colon (HCC) -MMR loss of [name]6, [name] with high mutation burden, acquired BRCA mutation (+)  --Initially presented with diarrhea 11/04/20, found to have diverticulitis with perforation and also developed an abscess requiring 2 drains, multiple hospital stay  --she started FOLFOX on 06/22/21 but did not respond -she switched to [facility] on 09/08/21. She has been tolerating well overall but has developed joint pain which could be related, overall manageable. She is clinically doing better also with more energy and less abdominal pain   -her CEA has normalized on immunotherapy. -CT CAP on 03/15/22 again showed mixed response -she had PET scan on 04/26/2022 for evaluation of her ovarian cysts, and saw GYN Dr. [name], we decided to monitor it. -She tolerating Keytruda well, with mild joint pain.  She recently started workout at the gym, which has helped her joint pain. -restaging CT from 08/04/2022 showed stable disease in left abdomen and pelvis, no new lesions.  I personally reviewed her scan images, and discussed  findings with patient.   -Will continue Keytruda  -repeated CT scan on January 24, 2023 showed reduced size of the subcutaneous soft tissue mass in lateral abdominal wall, no new lesions or other evidence of metastasis.  I personally reviewed the scan images with patient in the clinic today, the radiology report was not back and I will call her with formal results.  Arthralgia -Probably related to Keytruda, overall mild and manageable. 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  -we previously discussed testing for her children when they are of age.    Anemia -blood transfusion if Hg<8.0 -patient reports struggling with some constipation with taking iron pills in the past. Will give IV iron as needed to help bolster iron levels, last 02/22/22.     PLAN: -lab and scan reviewed -proceed with Keytruda today and continue every 3 weeks -Follow-up in 6 weeks    SUMMARY OF ONCOLOGIC HISTORY: Oncology History Overview Note   Cancer Staging  Malignant neoplasm of sigmoid colon [facility]) Staging form: Colon and Rectum, AJCC 8th Edition - Pathologic stage from 04/15/2021: Stage IIC (pT4b, pN0, cM0) - Signed by Malachy Mood, MD on 06/12/2021    Malignant neoplasm of sigmoid colon [facility])   Initial Diagnosis   Malignant neoplasm of sigmoid colon (HCC)   11/04/2020 Imaging   EXAM: CT ABDOMEN AND PELVIS WITHOUT CONTRAST  IMPRESSION: 1. Perforating descending colonic diverticulitis with multiple abdominopelvic gas and fluid collections, measuring up to 5.7 cm and further described above. 2. Cholelithiasis  without findings of acute cholecystitis. 3. 3.6 cm benign left adrenal adenoma. 4. Leiomyomatous uterus. 5. Asymmetric sclerosis of the iliac portion of the bilateral  SI joints, as can be seen with benign self-limiting osteitis condensans iliac.   11/07/2020 Imaging   EXAM: CT ABDOMEN AND PELVIS WITHOUT CONTRAST  IMPRESSION: Continued wall thickening is seen involving descending colon suggesting infectious or inflammatory colitis or perforated diverticulitis. There is an adjacent fluid collection measuring 7.0 x 5.0 cm consistent with abscess which is significantly enlarged compared to prior exam. Adjacent to the abscess, there is a severely thickened small bowel loop most consistent with secondary inflammation.   5.2 x 3.5 cm fluid collection is noted within the left psoas muscle consistent with abscess which is significantly enlarged compared to prior exam. 4.4 x 3.8 cm fluid collection consistent with abscess is noted in the left retroperitoneal region which is also enlarged compared to prior exam.   Fibroid uterus.   Cholelithiasis.   11/27/2020 Imaging   EXAM: CT ABDOMEN AND PELVIS WITHOUT CONTRAST  IMPRESSION: 1. Significantly interval decreased size of the previously visualized left retroperitoneal and left anterior mid abdominal fluid collections. There is persistent fat stranding and mural thickening of the mid descending colon at this level. 2. Unchanged leiomyomatous uterus. 3. Unchanged cholelithiasis.   12/11/2020 Imaging   EXAM: CT ABDOMEN AND PELVIS WITHOUT CONTRAST  IMPRESSION: 1. Stable inflammatory changes centered around the distal descending colon in the left lower abdomen. Pericolonic inflammatory changes have minimally changed since 11/27/2020. Small pocket of gas medial to the colon is probably associated with a fistula or small residual abscess collection. 2. Stable position of the two percutaneous drains. The more anterior drain may be extending through a portion of the small bowel. 3. Cholelithiasis. 4. Fibroid uterus. Cannot exclude an ovarian/adnexal cystic structure near the uterine fundus. 5. Left adrenal  adenoma.   01/07/2021 Imaging   EXAM: CT ABDOMEN AND PELVIS WITH CONTRAST  IMPRESSION: 1. No new abscesses identified. Similar degree of soft tissue thickening seen in the left pericolonic region. The degree of persistent soft tissues thickening in the pericolonic space further raises suspicions for malignancy. Further evaluation with colonoscopy should be performed. 2. Left retroperitoneal abscess drain unchanged in position. Anterior left abdominal drain again seen terminating within small bowel loop. 3. 2.6 cm mildly sclerotic lesion noted in the L2 vertebral body. Further evaluation with contrast enhanced lumbar spine MRI should be performed.   02/04/2021 Imaging   EXAM: CT ABDOMEN AND PELVIS WITH CONTRAST  IMPRESSION: No new abdominopelvic collections or abscess development in the 1 month interval.   Left anterior drain remains within a loop of small bowel, unchanged.   Left lateral abscess drain remains in the retroperitoneal space adjacent to the iliopsoas muscle with a small amount of residual fluid and air but no measurable collection.   Stable soft tissue prominence and pericolonic strandy edema/inflammation about the left descending colon compatible with residual diverticulitis/colitis. Difficult to exclude underlying transmural lesion.   04/01/2021 Imaging   EXAM: CT ABDOMEN AND PELVIS WITH CONTRAST  IMPRESSION: There appears to be significant enhancement and wall thickening involving the descending colon with some degree of traction and involvement of adjacent small bowel loops. This is consistent with the history of diverticulitis and perforation. Stable position of percutaneous drainage catheter is seen adjacent to left psoas muscle with no significant residual fluid remaining. The other percutaneous drainage catheter that was previously noted to be within small bowel loop on prior exam in the  left lower quadrant, has significantly retracted and appears to be  outside of the peritoneal space at this time.   Since the prior exam, there does appear to be some degree of rotation involving mesenteric vessels and structures in the right lower quadrant, suggesting partial volvulus or malrotation. There is the interval development of several lymph nodes in this area, most likely inflammatory or reactive in etiology. Mild amount of free fluid is also noted in the pelvis. However, no significant bowel wall thickening or dilatation is seen in this area. These results will be called to the ordering clinician or representative by the Radiologist Assistant, and communication documented in the PACS or zVision Dashboard.   Stable uterine fibroid.   Hepatic steatosis.   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   Cholelithiasis.   04/10/2021 Imaging   EXAM: CT ANGIOGRAPHY CHEST CT ABDOMEN AND PELVIS WITH CONTRAST  IMPRESSION: 1. Again seen are findings compatible with descending colon diverticulitis with perforation. Free air and inflammation have increased in the interval. 2. New lobulated enhancing fluid collection posterior to this segment of inflamed colon measuring 8.5 x 3.5 x 10.0 cm. This collection now invades the adjacent iliopsoas muscle as well as extends through the left lateral abdominal wall. The tip of the drainage catheter is in this collection. Findings are compatible with abscess. 3. Anterior left percutaneous drainage catheter tip has been pulled back and is now within the subcutaneous tissues. 4. Trace free fluid. 5. No pulmonary embolism.  No acute cardiopulmonary process. 6. Cholelithiasis. 7. Fatty infiltration of the liver.   04/15/2021 Definitive Surgery   FINAL MICROSCOPIC DIAGNOSIS:   A. SMALL BOWEL, RESECTION:  - Adenocarcinoma.  - No carcinoma identified in 1 lymph node.   B. PERFORATED LEFT COLON, RESECTION:  - Moderately differentiated colonic adenocarcinoma.  - Tumor extends into pericolonic adipose tissue, and is  strongly  suggestive of invasion into small bowel.  See oncology table/comments.  - No carcinoma identified in 4 lymph nodes (0/4).  - Tubular adenoma with high-grade dysplasia, 1.  - Tubular adenomas with low grade dysplasia, 3.   Comments: The size of the tumor is difficult to estimate secondary to the disrupted nature of the specimen, as well as the infiltrative nature of the tumor.  Tumor can be identified as definitely invading into the pericolonic adipose tissue (block B4), but given the extreme disruption of the tissue, and the involvement of the small bowel by what appears to be a colonic adenocarcinoma, I favor perforation of the large bowel with direct invasion into the small bowel.  Accordingly, I believe this is best regarded as a pT4b lesion.   ADDENDUM:  Mismatch Repair Protein (IHC)  SUMMARY INTERPRETATION: ABNORMAL  There is loss of the major MMR protein [name]6: This indicates a high probability that a hereditary germline mutation is present and referral to genetic counseling is warranted. It is recommended that the loss of protein expression be correlated with molecular based microsatellite instability testing.   IHC EXPRESSION RESULTS  TEST           RESULT  MLH1:          Preserved nuclear expression  [name]2:          Preserved nuclear expression  [name]6:          LOSS OF NUCLEAR EXPRESSION  PMS2:          Preserved nuclear expression    04/15/2021 Cancer Staging   Staging form: Colon and Rectum, AJCC 8th Edition -  Pathologic stage from 04/15/2021: Stage IIC (pT4b, pN0, cM0) - Signed by Malachy Mood, MD on 06/12/2021 Stage prefix: Initial diagnosis Total positive nodes: 0 Histologic grading system: 4 grade system Histologic grade (G): G2 Residual tumor (R): R0 - None   04/29/2021 Imaging   EXAM: CT ABDOMEN AND PELVIS WITH CONTRAST  IMPRESSION: Continued left retroperitoneal abscesses inferior to the left kidney, involving the left psoas muscle and left abdominal  wall musculature. Overall size is decreased since prior study. Interval removal of left lower quadrant abscess drainage catheter.   New fluid collection in the cul-de-sac and wrapping around the uterus concerning for abscess.   Cholelithiasis.   Small left pleural effusion.  Bibasilar atelectasis.   Stable left adrenal adenoma.  ADDENDUM: After discussing the case with Dr. Bryn Gulling in interventional radiology, it was noted that the fluid in the pelvis originally thought to be in the cul-de-sac is likely within the vagina, best seen on sagittal imaging. Recommend speculum exam.   Also, in the left lateral wall in the area of prior abscess in the lateral wall abdominal musculature, some of the abdomen soft tissue appears to be enhancing. While this could be infectious, cannot exclude tumor seeding in the left lateral abdominal wall, measuring 6.9 x 3.8 cm on image 64 of series 2.   06/16/2021 Imaging   EXAM: CT ABDOMEN AND PELVIS WITH CONTRAST  IMPRESSION: Increased size of bulky soft tissue masses involving the left psoas muscle and left lateral abdominal wall soft tissues, consistent with progressive metastatic disease.   Significant progression of multiple peritoneal masses throughout the abdomen and pelvis, consistent with peritoneal carcinomatosis.   New mild retroperitoneal lymphadenopathy, consistent with metastatic disease.   Stable uterine fibroid and left adrenal adenoma.   Cholelithiasis, without evidence of cholecystitis.   06/22/2021 - 08/19/2021 Chemotherapy   Patient is on Treatment Plan : COLORECTAL FOLFOX q14d x 6 months     06/22/2021 Tumor Marker   Patient's tumor was tested for the following markers: CEA. Results of the tumor marker test revealed 87.41.   07/10/2021 Pathology Results   FINAL MICROSCOPIC DIAGNOSIS:   A. PERITONEAL MASS, LEFT UPPER ABDOMINAL QUADRANT, BIOPSY:  -  Metastatic adenocarcinoma with necrosis, histologically consistent with colon  primary.     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 The report date is 07/16/2021. The remainder of testing was negative/normal.  The CancerNext-Expanded + RNAinsight gene panel offered by [employer] and includes sequencing and rearrangement analysis for the following 77 genes: IP, ALK, APC*, ATM*, AXIN2, BAP1, BARD1, BLM, BMPR1A, BRCA1*, BRCA2*, BRIP1*, CDC73, CDH1*,CDK4, CDKN1B, CDKN2A, CHEK2*, CTNNA1, DICER1, FANCC, FH, FLCN, GALNT12, KIF1B, LZTR1, MAX, MEN1, MET, MLH1*, [name]2*, [name]3, [name]6*, MUTYH*, NBN, NF1*, NF2, NTHL1, PALB2*, PHOX2B, PMS2*, POT1, PRKAR1A, PTCH1, PTEN*, RAD51C*, RAD51D*,RB1, RECQL, RET, SDHA, SDHAF2, SDHB, SDHC, SDHD, SMAD4, SMARCA4, SMARCB1, SMARCE1, STK11, SUFU, TMEM127, TP53*,TSC1, TSC2, VHL and XRCC2 (sequencing and deletion/duplication); EGFR, EGLN1, HOXB13, KIT, MITF, PDGFRA, POLD1 and POLE (sequencing only); EPCAM and GREM1 (deletion/duplication only).   08/28/2021 Imaging   EXAM: CT CHEST, ABDOMEN, AND PELVIS WITH CONTRAST  IMPRESSION: 1. Continued interval progression of multiple metastatic soft tissue nodules/masses in the abdomen and pelvis. 2. No evidence for metastatic disease in the chest. 3. Similar appearance of the subtle lesion in the L2 vertebral body, suspicious for metastatic involvement. 4. Cholelithiasis.   09/03/2021 -  Chemotherapy   Patient is on Treatment Plan : COLORECTAL Pembrolizumab (200) q21d  09/08/2021 - 02/01/2022 Chemotherapy   Patient is on Treatment Plan : COLORECTAL Pembrolizumab (200) q21d     01/08/2022 Imaging   CT CAP IMPRESSION: 1. Mixed response to therapy. Interval decrease in size of the conglomerate multilobulated mass extending from the left psoas into the left posterior pararenal space through the left lateral abdominal wall along the tract of the previously placed percutaneous drainage catheter. Interval decrease in size of right adnexal mass  and omental masses within the right lower quadrant of the abdomen. Interval development of peritoneal carcinomatosis with omental caking within the left lower quadrant of the abdomen and left subdiaphragmatic region adjacent to the spleen and along the left pericolic gutter as well as development of ascites appearing loculated within the anterior peritoneum. 2. Interval development of mild left hydronephrosis secondary to stricturing of the mid left ureter. 3. Stable left adrenal metastasis. 4. Stable sclerotic lesion within the L2 vertebral body. No new lytic or blastic bone lesions are seen. 5. Cholelithiasis. 6. No evidence of intrathoracic metastatic disease. 7. 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 Moderate colonic stool burden. No evidence of obstruction.   03/15/2022 Imaging   IMPRESSION: 1. Status post sigmoid colon resection with left lower quadrant end colostomy. 2. Multicystic bilateral ovarian lesions are slightly increased in size. 3. Diminished, small volume of loculated appearing ascites throughout the abdomen and pelvis, with similar appearance of peritoneal thickening and nodularity throughout. 4. Heterogeneously calcified, conglomerate masses in the left retroperitoneum and abdominal wall are slightly diminished in size. 5. Left adrenal lesion not significantly changed. 6. Unchanged faintly sclerotic metastatic lesion of L2. 7. Constellation of findings is consistent with mixed response to treatment however with overall little interval change. 8. Minimal residual left hydronephrosis and proximal hydroureter, improved compared to prior examination. 9. Cholelithiasis. 10. Coronary artery disease, significantly advanced for patient age.   08/04/2022 Imaging    IMPRESSION: CT CHEST:   1. No developing mass lesion, fluid collection or lymph node enlargement in the thorax. Stable tiny 3 mm lung  nodule. 2. Coronary artery calcifications. Please correlate for other coronary risk factors. 3. Chest port   CT ABDOMEN AND PELVIS:   1. Extensive surgical changes identified. Left lower quadrant ostomy, diverting colostomy. Surgical changes along loops of small bowel in the right hemiabdomen. 2. Once again there are areas of nodular tissue extending along the left lateral abdominal wall, into the retroperitoneum which are similar to previous. The amount of adjacent fluid in these locations has improved. 3. No new mass lesion, fluid collection or lymph node enlargement in the abdomen or pelvis. 4. Gallstones. 5. Enlarged uterus with fibroids. Bilateral complex cystic areas are once again seen and based on appearance differential includes dilated fallopian tubes, hydrosalpinx versus true cystic lesions. Please correlate for any known history or prior workup 6. 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 No developing mass lesion, fluid collection or lymph node enlargement in the thorax. Stable tiny 3 mm lung nodule. 2. Coronary artery calcifications. Please correlate for other coronary risk factors. 3. Chest port   CT ABDOMEN AND PELVIS:  1. Extensive surgical changes identified. Left lower quadrant ostomy, diverting colostomy. Surgical changes along loops of small bowel in the right hemiabdomen. 2. Once again there are areas of nodular tissue extending along the left lateral abdominal wall, into the retroperitoneum which are similar to previous. The amount of adjacent fluid in these locations has improved. 3. No new mass lesion, fluid collection or  lymph node enlargement in the abdomen or pelvis. 4. Gallstones. 5. Enlarged uterus with fibroids. Bilateral complex cystic areas are once again seen and based on appearance differential includes dilated fallopian tubes, hydrosalpinx versus true cystic lesions. Please correlate for any known history or prior workup 6. Limited evaluation for solid organ pathology metastatic disease without the advantage of IV contrast        INTERVAL HISTORY:  SHLEY PICKLE is here for a follow up of metastatic colon cancer. She was last seen by me on 01/05/2023 She presents to the clinic accompanied by husband. Pt state that she is doing well. Pt has no issue with Keytruda. Pt state that she has joint pain in her hands and knees. Pt reports most days are tolerable.    All other systems were reviewed with the patient and are negative.  MEDICAL HISTORY:  Past Medical History:  Diagnosis Date   Allergy    seasonal   Anemia    Blood infection 1985   Blood transfusion without reported diagnosis    colon ca 04/2021   Diverticulitis    Family history of breast cancer    Family history of colon cancer    Family history of colon cancer    Family history of stomach cancer    Hypertension    Obesity    Sleep apnea     SURGICAL HISTORY: Past Surgical History:  Procedure Laterality Date   COLECTOMY WITH COLOSTOMY CREATION/HARTMANN PROCEDURE N/A 04/15/2021   Procedure: COLOSTOMY CREATION/HARTMANN PROCEDURE;  Surgeon: [name], MD;  Location: [facility];  Service: General;  Laterality: N/A;   IR CATHETER TUBE CHANGE  12/12/2020   IR CATHETER TUBE CHANGE  01/27/2021   IR CATHETER TUBE CHANGE  02/19/2021   IR CATHETER TUBE CHANGE  04/13/2021   IR IMAGING GUIDED PORT INSERTION  10/02/2021   IR RADIOLOGIST EVAL & MGMT  11/27/2020   IR RADIOLOGIST EVAL & MGMT  12/11/2020   IR RADIOLOGIST EVAL & MGMT  01/07/2021   IR RADIOLOGIST EVAL & MGMT  02/04/2021   IR SINUS/FIST TUBE CHK-NON GI  12/12/2020   IR  SINUS/FIST TUBE CHK-NON GI  02/19/2021   LAPAROSCOPIC PARTIAL COLECTOMY N/A 04/15/2021   Procedure: LAPAROSCOPIC ASSISTED HARTMANN RESECTION;  Surgeon: [name], MD;  Location: [facility];  Service: General;  Laterality: N/A;    I have reviewed the social history and family history with the patient and they are unchanged from previous note.  ALLERGIES:  is allergic to chlorhexidine gluconate, shellfish allergy, and penicillins.  MEDICATIONS:  Current Outpatient Medications  Medication Sig Dispense Refill   ALPRAZolam (XANAX) 0.25 MG tablet Take 1 tablet (0.25 mg total) by mouth at bedtime as needed for anxiety. 30 tablet 0   amLODipine (NORVASC) 10 MG tablet TAKE 1 TABLET BY MOUTH EVERY DAY 90 tablet 3   furosemide (LASIX) 20 MG tablet TAKE 1 TABLET BY MOUTH EVERY DAY AS NEEDED (Patient not taking: Reported on 11/30/2022) 90 tablet 1   KLOR-CON M20 20 MEQ tablet TAKE 1 TABLET BY MOUTH EVERY DAY 90 tablet 1   levonorgestrel (MIRENA, 52 MG,) 20  MCG/DAY IUD Take 1 device by intrauterine route.     lidocaine-prilocaine (EMLA) cream Apply 1 Application topically as needed. 30 g 3   Metoprolol Tartrate 75 MG TABS Take 1 tablet (75 mg total) by mouth 2 (two) times daily. (Patient taking differently: Take 75 mg by mouth 2 (two) times daily. Takes 1/2 tablet by mouth 2x daily.) 90 tablet 3   POTASSIUM PO Take by mouth.     pregabalin (LYRICA) 75 MG capsule Take 1 capsule (75 mg total) by mouth at bedtime. X 1 week, then 75 mg BID x 1 week, then can increase to 75 in AM and 150 mg QHS- nerve pain 90 capsule 5   sulfamethoxazole-trimethoprim (BACTRIM DS) 800-160 MG tablet TAKE 1 TABLET(S) EVERY 12 HOURS BY ORAL ROUTE FOR 3 DAYS.     traZODone (DESYREL) 50 MG tablet TAKE 1-2 TABLETS BY MOUTH AT BEDTIME AS NEEDED FOR SLEEP. 180 tablet 1   No current facility-administered medications for this visit.   Facility-Administered Medications Ordered in Other Visits  Medication Dose Route Frequency Provider Last  Rate Last Admin   fentaNYL (SUBLIMAZE) injection   Intravenous PRN Mir, Al Corpus, MD   50 mcg at 10/02/21 1436   midazolam (VERSED) injection   Intravenous PRN Mir, Al Corpus, MD   1 mg at 10/02/21 1436   sodium chloride flush (NS) 0.9 % injection 10 mL  10 mL Intracatheter PRN Malachy Mood, MD   10 mL at 01/26/23 1512    PHYSICAL EXAMINATION: ECOG PERFORMANCE STATUS: 1 - Symptomatic but completely ambulatory  Vitals:   01/26/23 1250  BP: (!) 145/80  Pulse: 86  Resp: 20  Temp: 98.6 F (37 C)  SpO2: 100%   Wt Readings from Last 3 Encounters:  01/26/23 299 lb 11.2 oz (135.9 kg)  01/05/23 296 lb 14.4 oz (134.7 kg)  12/21/22 295 lb (133.8 kg)     GENERAL:alert, no distress and comfortable SKIN: skin color normal, no rashes or significant lesions EYES: normal, Conjunctiva are pink and non-injected, sclera clear  NEURO: alert & oriented x 3 with fluent speech   LABORATORY DATA:  I have reviewed the data as listed    Latest Ref Rng & Units 01/24/2023    2:51 PM 01/05/2023   11:40 AM 12/15/2022    2:02 PM  CBC  WBC 4.0 - 10.5 K/uL 5.7  5.9  5.6   Hemoglobin 12.0 - 15.0 g/dL 64.4  03.4  74.2   Hematocrit 36.0 - 46.0 % 35.1  37.7  34.8   Platelets 150 - 400 K/uL 413  415  264         Latest Ref Rng & Units 01/24/2023    2:51 PM 01/05/2023   11:40 AM 12/15/2022    2:02 PM  CMP  Glucose 70 - 99 mg/dL 91  90  89   BUN 6 - 20 mg/dL 15  13  13    Creatinine 0.44 - 1.00 mg/dL 5.95  6.38  7.56   Sodium 135 - 145 mmol/L 138  138  141   Potassium 3.5 - 5.1 mmol/L 3.8  4.3  4.0   Chloride 98 - 111 mmol/L 110  108  109   CO2 22 - 32 mmol/L 22  25  27    Calcium 8.9 - 10.3 mg/dL 8.5  9.0  8.9   Total Protein 6.5 - 8.1 g/dL 7.4  7.5  7.2   Total Bilirubin 0.3 - 1.2 mg/dL 0.3  0.3  0.3  Alkaline Phos 38 - 126 U/L 40  46  51   AST 15 - 41 U/L 12  13  11    ALT 0 - 44 U/L 9  10  8        RADIOGRAPHIC STUDIES: I have personally reviewed the radiological images as listed and agreed with  the findings in the report. No results found.    Orders Placed This Encounter  Procedures   CBC with Differential (Cancer Center Only)    Standing Status:   Future    Standing Expiration Date:   04/19/2024   CMP (Cancer Center only)    Standing Status:   Future    Standing Expiration Date:   04/19/2024   CBC with Differential (Cancer Center Only)    Standing Status:   Future    Standing Expiration Date:   05/10/2024   CMP (Cancer Center only)    Standing Status:   Future    Standing Expiration Date:   05/10/2024   All questions were answered. The patient knows to call the clinic with any problems, questions or concerns. No barriers to learning was detected. The total time spent in the appointment was 30 minutes.     Malachy Mood, MD 01/26/2023   Carolin Coy, CMA, am acting as scribe for Malachy Mood, MD.   I have reviewed the above documentation for accuracy and completeness, and I agree with the above.

## 2023-01-26 NOTE — Assessment & Plan Note (Signed)
-  MMR loss of [name]6, [name] with high mutation burden, acquired BRCA mutation (+)  --Initially presented with diarrhea 11/04/20, found to have diverticulitis with perforation and also developed an abscess requiring 2 drains, multiple hospital stay  --she started FOLFOX on 06/22/21 but did not respond -she switched to [facility] on 09/08/21. She has been tolerating well overall but has developed joint pain which could be related, overall manageable. She is clinically doing better also with more energy and less abdominal pain   -her CEA has normalized on immunotherapy. -CT CAP on 03/15/22 again showed mixed response -she had PET scan on 04/26/2022 for evaluation of her ovarian cysts, and saw GYN Dr. [name], we decided to monitor it. -She tolerating Keytruda well, with mild joint pain.  She recently started workout at the gym, which has helped her joint pain. -restaging CT from 08/04/2022 showed stable disease in left abdomen and pelvis, no new lesions.  I personally reviewed her scan images, and discussed findings with patient.   -Will continue Keytruda  -repeated CT scan on January 24, 2023 showed

## 2023-02-16 ENCOUNTER — Other Ambulatory Visit: Payer: Self-pay

## 2023-02-16 ENCOUNTER — Inpatient Hospital Stay: Payer: Managed Care, Other (non HMO)

## 2023-02-16 ENCOUNTER — Inpatient Hospital Stay: Payer: Managed Care, Other (non HMO) | Attending: Hematology | Admitting: Hematology

## 2023-02-16 ENCOUNTER — Encounter: Payer: Self-pay | Admitting: Hematology

## 2023-02-16 VITALS — BP 128/87 | HR 78 | Temp 98.4°F | Resp 16 | Wt 300.2 lb

## 2023-02-16 DIAGNOSIS — C7972 Secondary malignant neoplasm of left adrenal gland: Secondary | ICD-10-CM | POA: Insufficient documentation

## 2023-02-16 DIAGNOSIS — C187 Malignant neoplasm of sigmoid colon: Secondary | ICD-10-CM

## 2023-02-16 DIAGNOSIS — D3502 Benign neoplasm of left adrenal gland: Secondary | ICD-10-CM | POA: Diagnosis not present

## 2023-02-16 DIAGNOSIS — M2550 Pain in unspecified joint: Secondary | ICD-10-CM | POA: Insufficient documentation

## 2023-02-16 DIAGNOSIS — Z803 Family history of malignant neoplasm of breast: Secondary | ICD-10-CM | POA: Insufficient documentation

## 2023-02-16 DIAGNOSIS — D649 Anemia, unspecified: Secondary | ICD-10-CM | POA: Diagnosis not present

## 2023-02-16 DIAGNOSIS — Z793 Long term (current) use of hormonal contraceptives: Secondary | ICD-10-CM | POA: Insufficient documentation

## 2023-02-16 DIAGNOSIS — J90 Pleural effusion, not elsewhere classified: Secondary | ICD-10-CM | POA: Insufficient documentation

## 2023-02-16 DIAGNOSIS — C7951 Secondary malignant neoplasm of bone: Secondary | ICD-10-CM | POA: Insufficient documentation

## 2023-02-16 DIAGNOSIS — C786 Secondary malignant neoplasm of retroperitoneum and peritoneum: Secondary | ICD-10-CM

## 2023-02-16 DIAGNOSIS — Z800 Family history of malignant neoplasm of digestive organs: Secondary | ICD-10-CM | POA: Diagnosis not present

## 2023-02-16 DIAGNOSIS — K8020 Calculus of gallbladder without cholecystitis without obstruction: Secondary | ICD-10-CM | POA: Insufficient documentation

## 2023-02-16 DIAGNOSIS — D1391 Familial adenomatous polyposis: Secondary | ICD-10-CM | POA: Insufficient documentation

## 2023-02-16 DIAGNOSIS — D500 Iron deficiency anemia secondary to blood loss (chronic): Secondary | ICD-10-CM

## 2023-02-16 DIAGNOSIS — Z1501 Genetic susceptibility to malignant neoplasm of breast: Secondary | ICD-10-CM | POA: Insufficient documentation

## 2023-02-16 DIAGNOSIS — Z79899 Other long term (current) drug therapy: Secondary | ICD-10-CM | POA: Insufficient documentation

## 2023-02-16 DIAGNOSIS — Z452 Encounter for adjustment and management of vascular access device: Secondary | ICD-10-CM

## 2023-02-16 DIAGNOSIS — I2510 Atherosclerotic heart disease of native coronary artery without angina pectoris: Secondary | ICD-10-CM | POA: Insufficient documentation

## 2023-02-16 DIAGNOSIS — K5720 Diverticulitis of large intestine with perforation and abscess without bleeding: Secondary | ICD-10-CM | POA: Diagnosis not present

## 2023-02-16 DIAGNOSIS — K760 Fatty (change of) liver, not elsewhere classified: Secondary | ICD-10-CM | POA: Insufficient documentation

## 2023-02-16 DIAGNOSIS — Z5112 Encounter for antineoplastic immunotherapy: Secondary | ICD-10-CM | POA: Insufficient documentation

## 2023-02-16 LAB — CBC WITH DIFFERENTIAL (CANCER CENTER ONLY)
Abs Immature Granulocytes: 0.02 10*3/uL (ref 0.00–0.07)
Basophils Absolute: 0 10*3/uL (ref 0.0–0.1)
Basophils Relative: 0 %
Eosinophils Absolute: 0.1 10*3/uL (ref 0.0–0.5)
Eosinophils Relative: 2 %
HCT: 36.6 % (ref 36.0–46.0)
Hemoglobin: 12 g/dL (ref 12.0–15.0)
Immature Granulocytes: 0 %
Lymphocytes Relative: 31 %
Lymphs Abs: 2.2 10*3/uL (ref 0.7–4.0)
MCH: 27.3 pg (ref 26.0–34.0)
MCHC: 32.8 g/dL (ref 30.0–36.0)
MCV: 83.4 fL (ref 80.0–100.0)
Monocytes Absolute: 0.4 10*3/uL (ref 0.1–1.0)
Monocytes Relative: 6 %
Neutro Abs: 4.3 10*3/uL (ref 1.7–7.7)
Neutrophils Relative %: 61 %
Platelet Count: 472 10*3/uL — ABNORMAL HIGH (ref 150–400)
RBC: 4.39 MIL/uL (ref 3.87–5.11)
RDW: 14.8 % (ref 11.5–15.5)
WBC Count: 7.1 10*3/uL (ref 4.0–10.5)
nRBC: 0 % (ref 0.0–0.2)

## 2023-02-16 LAB — CMP (CANCER CENTER ONLY)
ALT: 11 U/L (ref 0–44)
AST: 11 U/L — ABNORMAL LOW (ref 15–41)
Albumin: 4.2 g/dL (ref 3.5–5.0)
Alkaline Phosphatase: 39 U/L (ref 38–126)
Anion gap: 6 (ref 5–15)
BUN: 30 mg/dL — ABNORMAL HIGH (ref 6–20)
CO2: 20 mmol/L — ABNORMAL LOW (ref 22–32)
Calcium: 8.9 mg/dL (ref 8.9–10.3)
Chloride: 109 mmol/L (ref 98–111)
Creatinine: 1.08 mg/dL — ABNORMAL HIGH (ref 0.44–1.00)
GFR, Estimated: >60 mL/min (ref >60)
Glucose, Bld: 94 mg/dL (ref 70–99)
Potassium: 4.4 mmol/L (ref 3.5–5.1)
Sodium: 135 mmol/L (ref 135–145)
Total Bilirubin: 0.3 mg/dL (ref 0.3–1.2)
Total Protein: 7.7 g/dL (ref 6.5–8.1)

## 2023-02-16 MED ORDER — HEPARIN SOD (PORK) LOCK FLUSH 100 UNIT/ML IV SOLN
500.0000 [IU] | Freq: Once | INTRAVENOUS | Status: AC | PRN
  Administered 2023-02-16: 500 [IU]

## 2023-02-16 MED ORDER — SODIUM CHLORIDE 0.9 % IV SOLN: Freq: Once | INTRAVENOUS | Status: AC

## 2023-02-16 MED ORDER — SODIUM CHLORIDE 0.9 % IV SOLN
200.0000 mg | Freq: Once | INTRAVENOUS | Status: AC
  Administered 2023-02-16: 200 mg via INTRAVENOUS
  Filled 2023-02-16: qty 200

## 2023-02-16 MED ORDER — SODIUM CHLORIDE 0.9% FLUSH
10.0000 mL | Freq: Once | INTRAVENOUS | Status: AC
  Administered 2023-02-16: 10 mL

## 2023-02-16 MED ORDER — SODIUM CHLORIDE 0.9% FLUSH
10.0000 mL | INTRAVENOUS | Status: DC | PRN
  Administered 2023-02-16: 10 mL

## 2023-02-16 NOTE — Progress Notes (Signed)
Patient seen by Dr. America Brown are within treatment parameters.  Labs reviewed:    ok to treat with Pregnancy/Urine  Per physician team, patient is ready for treatment and there are NO modifications to the treatment plan.

## 2023-02-16 NOTE — Assessment & Plan Note (Signed)
 -  FO showed MSI-High disease, and multiple mutations including APC, [name]6, and BRCA2 mutations.  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  -we previously discussed testing for her children when they are of age.

## 2023-02-16 NOTE — Progress Notes (Signed)
[facility]   Telephone:([phone]) [phone] Fax:([phone]   Clinic Follow up Note   Patient Care Team: [name], MD as PCP - General (Internal Medicine) [name], MD as PCP - Cardiology (Cardiology) [name], [name] as Physician Assistant (Emergency Medicine) Malachy Mood, MD as Consulting Physician (Oncology) [name], MD as Consulting Physician (General Surgery) [name], [name], MD as Consulting Physician (Neurology) [name], [name], MD as Consulting Physician (Obstetrics and Gynecology) [name], MD as Consulting Physician (Gastroenterology) [name], [name], NP as Nurse Practitioner (Nurse Practitioner)  Date of Service:  02/16/2023  CHIEF COMPLAINT: f/u of metastatic colon cancer   CURRENT THERAPY: Pembrolizumab q21d   ASSESSMENT:  Mikayla Rios is a 37 y.o. female with   Malignant neoplasm of sigmoid colon (HCC) -MMR loss of [name]6, [name] with high mutation burden, acquired BRCA mutation (+)  --Initially presented with diarrhea 11/04/20, found to have diverticulitis with perforation and also developed an abscess requiring 2 drains, multiple hospital stay  --she started FOLFOX on 06/22/21 but did not respond -she switched to [facility] on 09/08/21. She has been tolerating well overall but has developed joint pain which could be related, overall manageable. She is clinically doing better also with more energy and less abdominal pain   -her CEA has normalized on immunotherapy. -CT CAP on 03/15/22 again showed mixed response -she had PET scan on 04/26/2022 for evaluation of her ovarian cysts, and saw GYN Dr. [name], we decided to monitor it. -She tolerating Keytruda well, with mild joint pain.  She recently started workout at the gym, which has helped her joint pain. -restaging CT from 08/04/2022 showed stable disease in left abdomen and pelvis, no new lesions.  I personally reviewed her scan images, and discussed  findings with patient.   -Will continue Keytruda  -repeated CT scan on January 24, 2023 showed continuous response, no new lesions.   Arthralgia -Probably related to Keytruda, overall mild and manageable. -She tried Lyrica but did not like it and stopped it     Anemia -blood transfusion if Hg<8.0 -patient reports struggling with some constipation with taking iron pills in the past. Will give IV iron as needed to help bolster iron levels, last 02/22/22.  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  -we previously discussed testing for her children when they are of age.       PLAN: -lab reviewed -CMP -pending -Continue Keytruda q 3 wks -recommend pt to take prenatal vitamins for Iron and B12 supplement, and over-the-counter glucosamine for her joint pain. -lab/flush and f/u on 03/09/2023   SUMMARY OF ONCOLOGIC HISTORY: Oncology History Overview Note   Cancer Staging  Malignant neoplasm of sigmoid colon [facility]) Staging form: Colon and Rectum, AJCC 8th Edition - Pathologic stage from 04/15/2021: Stage IIC (pT4b, pN0, cM0) - Signed by Malachy Mood, MD on 06/12/2021    Malignant neoplasm of sigmoid colon [facility])   Initial Diagnosis   Malignant neoplasm of sigmoid colon (HCC)   11/04/2020 Imaging   EXAM: CT ABDOMEN AND PELVIS WITHOUT CONTRAST  IMPRESSION: 1. Perforating descending colonic diverticulitis with multiple abdominopelvic gas and fluid collections, measuring up to 5.7 cm and further described above. 2. Cholelithiasis without findings of acute cholecystitis. 3. 3.6 cm benign left adrenal adenoma. 4. Leiomyomatous uterus. 5. Asymmetric sclerosis of the iliac portion of the bilateral SI joints, as  can be seen with benign self-limiting osteitis condensans iliac.   11/07/2020 Imaging   EXAM: CT ABDOMEN AND PELVIS  WITHOUT CONTRAST  IMPRESSION: Continued wall thickening is seen involving descending colon suggesting infectious or inflammatory colitis or perforated diverticulitis. There is an adjacent fluid collection measuring 7.0 x 5.0 cm consistent with abscess which is significantly enlarged compared to prior exam. Adjacent to the abscess, there is a severely thickened small bowel loop most consistent with secondary inflammation.   5.2 x 3.5 cm fluid collection is noted within the left psoas muscle consistent with abscess which is significantly enlarged compared to prior exam. 4.4 x 3.8 cm fluid collection consistent with abscess is noted in the left retroperitoneal region which is also enlarged compared to prior exam.   Fibroid uterus.   Cholelithiasis.   11/27/2020 Imaging   EXAM: CT ABDOMEN AND PELVIS WITHOUT CONTRAST  IMPRESSION: 1. Significantly interval decreased size of the previously visualized left retroperitoneal and left anterior mid abdominal fluid collections. There is persistent fat stranding and mural thickening of the mid descending colon at this level. 2. Unchanged leiomyomatous uterus. 3. Unchanged cholelithiasis.   12/11/2020 Imaging   EXAM: CT ABDOMEN AND PELVIS WITHOUT CONTRAST  IMPRESSION: 1. Stable inflammatory changes centered around the distal descending colon in the left lower abdomen. Pericolonic inflammatory changes have minimally changed since 11/27/2020. Small pocket of gas medial to the colon is probably associated with a fistula or small residual abscess collection. 2. Stable position of the two percutaneous drains. The more anterior drain may be extending through a portion of the small bowel. 3. Cholelithiasis. 4. Fibroid uterus. Cannot exclude an ovarian/adnexal cystic structure near the uterine fundus. 5. Left adrenal adenoma.   01/07/2021 Imaging   EXAM: CT ABDOMEN AND PELVIS WITH CONTRAST  IMPRESSION: 1. No new abscesses identified. Similar degree  of soft tissue thickening seen in the left pericolonic region. The degree of persistent soft tissues thickening in the pericolonic space further raises suspicions for malignancy. Further evaluation with colonoscopy should be performed. 2. Left retroperitoneal abscess drain unchanged in position. Anterior left abdominal drain again seen terminating within small bowel loop. 3. 2.6 cm mildly sclerotic lesion noted in the L2 vertebral body. Further evaluation with contrast enhanced lumbar spine MRI should be performed.   02/04/2021 Imaging   EXAM: CT ABDOMEN AND PELVIS WITH CONTRAST  IMPRESSION: No new abdominopelvic collections or abscess development in the 1 month interval.   Left anterior drain remains within a loop of small bowel, unchanged.   Left lateral abscess drain remains in the retroperitoneal space adjacent to the iliopsoas muscle with a small amount of residual fluid and air but no measurable collection.   Stable soft tissue prominence and pericolonic strandy edema/inflammation about the left descending colon compatible with residual diverticulitis/colitis. Difficult to exclude underlying transmural lesion.   04/01/2021 Imaging   EXAM: CT ABDOMEN AND PELVIS WITH CONTRAST  IMPRESSION: There appears to be significant enhancement and wall thickening involving the descending colon with some degree of traction and involvement of adjacent small bowel loops. This is consistent with the history of diverticulitis and perforation. Stable position of percutaneous drainage catheter is seen adjacent to left psoas muscle with no significant residual fluid remaining. The other percutaneous drainage catheter that was previously noted to be within small bowel loop on prior exam in the left lower quadrant, has significantly retracted and appears to be outside of the peritoneal space at this time.   Since the prior exam, there does appear to  be some degree of rotation involving  mesenteric vessels and structures in the right lower quadrant, suggesting partial volvulus or malrotation. There is the interval development of several lymph nodes in this area, most likely inflammatory or reactive in etiology. Mild amount of free fluid is also noted in the pelvis. However, no significant bowel wall thickening or dilatation is seen in this area. These results will be called to the ordering clinician or representative by the Radiologist Assistant, and communication documented in the PACS or zVision Dashboard.   Stable uterine fibroid.   Hepatic steatosis.   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   Cholelithiasis.   04/10/2021 Imaging   EXAM: CT ANGIOGRAPHY CHEST CT ABDOMEN AND PELVIS WITH CONTRAST  IMPRESSION: 1. Again seen are findings compatible with descending colon diverticulitis with perforation. Free air and inflammation have increased in the interval. 2. New lobulated enhancing fluid collection posterior to this segment of inflamed colon measuring 8.5 x 3.5 x 10.0 cm. This collection now invades the adjacent iliopsoas muscle as well as extends through the left lateral abdominal wall. The tip of the drainage catheter is in this collection. Findings are compatible with abscess. 3. Anterior left percutaneous drainage catheter tip has been pulled back and is now within the subcutaneous tissues. 4. Trace free fluid. 5. No pulmonary embolism.  No acute cardiopulmonary process. 6. Cholelithiasis. 7. Fatty infiltration of the liver.   04/15/2021 Definitive Surgery   FINAL MICROSCOPIC DIAGNOSIS:   A. SMALL BOWEL, RESECTION:  - Adenocarcinoma.  - No carcinoma identified in 1 lymph node.   B. PERFORATED LEFT COLON, RESECTION:  - Moderately differentiated colonic adenocarcinoma.  - Tumor extends into pericolonic adipose tissue, and is strongly  suggestive of invasion into small bowel.  See oncology table/comments.  - No carcinoma identified in 4 lymph nodes  (0/4).  - Tubular adenoma with high-grade dysplasia, 1.  - Tubular adenomas with low grade dysplasia, 3.   Comments: The size of the tumor is difficult to estimate secondary to the disrupted nature of the specimen, as well as the infiltrative nature of the tumor.  Tumor can be identified as definitely invading into the pericolonic adipose tissue (block B4), but given the extreme disruption of the tissue, and the involvement of the small bowel by what appears to be a colonic adenocarcinoma, I favor perforation of the large bowel with direct invasion into the small bowel.  Accordingly, I believe this is best regarded as a pT4b lesion.   ADDENDUM:  Mismatch Repair Protein (IHC)  SUMMARY INTERPRETATION: ABNORMAL  There is loss of the major MMR protein [name]6: This indicates a high probability that a hereditary germline mutation is present and referral to genetic counseling is warranted. It is recommended that the loss of protein expression be correlated with molecular based microsatellite instability testing.   IHC EXPRESSION RESULTS  TEST           RESULT  MLH1:          Preserved nuclear expression  [name]2:          Preserved nuclear expression  [name]6:          LOSS OF NUCLEAR EXPRESSION  PMS2:          Preserved nuclear expression    04/15/2021 Cancer Staging   Staging form: Colon and Rectum, AJCC 8th Edition - Pathologic stage from 04/15/2021: Stage IIC (pT4b, pN0, cM0) - Signed by Malachy Mood, MD on 06/12/2021 Stage prefix: Initial diagnosis Total positive nodes: 0 Histologic grading  system: 4 grade system Histologic grade (G): G2 Residual tumor (R): R0 - None   04/29/2021 Imaging   EXAM: CT ABDOMEN AND PELVIS WITH CONTRAST  IMPRESSION: Continued left retroperitoneal abscesses inferior to the left kidney, involving the left psoas muscle and left abdominal wall musculature. Overall size is decreased since prior study. Interval removal of left lower quadrant abscess drainage catheter.   New  fluid collection in the cul-de-sac and wrapping around the uterus concerning for abscess.   Cholelithiasis.   Small left pleural effusion.  Bibasilar atelectasis.   Stable left adrenal adenoma.  ADDENDUM: After discussing the case with Dr. Bryn Gulling in interventional radiology, it was noted that the fluid in the pelvis originally thought to be in the cul-de-sac is likely within the vagina, best seen on sagittal imaging. Recommend speculum exam.   Also, in the left lateral wall in the area of prior abscess in the lateral wall abdominal musculature, some of the abdomen soft tissue appears to be enhancing. While this could be infectious, cannot exclude tumor seeding in the left lateral abdominal wall, measuring 6.9 x 3.8 cm on image 64 of series 2.   06/16/2021 Imaging   EXAM: CT ABDOMEN AND PELVIS WITH CONTRAST  IMPRESSION: Increased size of bulky soft tissue masses involving the left psoas muscle and left lateral abdominal wall soft tissues, consistent with progressive metastatic disease.   Significant progression of multiple peritoneal masses throughout the abdomen and pelvis, consistent with peritoneal carcinomatosis.   New mild retroperitoneal lymphadenopathy, consistent with metastatic disease.   Stable uterine fibroid and left adrenal adenoma.   Cholelithiasis, without evidence of cholecystitis.   06/22/2021 - 08/19/2021 Chemotherapy   Patient is on Treatment Plan : COLORECTAL FOLFOX q14d x 6 months     06/22/2021 Tumor Marker   Patient's tumor was tested for the following markers: CEA. Results of the tumor marker test revealed 87.41.   07/10/2021 Pathology Results   FINAL MICROSCOPIC DIAGNOSIS:   A. PERITONEAL MASS, LEFT UPPER ABDOMINAL QUADRANT, BIOPSY:  -  Metastatic adenocarcinoma with necrosis, histologically consistent with colon primary.     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 The report  date is 07/16/2021. The remainder of testing was negative/normal.  The CancerNext-Expanded + RNAinsight gene panel offered by [employer] and includes sequencing and rearrangement analysis for the following 77 genes: IP, ALK, APC*, ATM*, AXIN2, BAP1, BARD1, BLM, BMPR1A, BRCA1*, BRCA2*, BRIP1*, CDC73, CDH1*,CDK4, CDKN1B, CDKN2A, CHEK2*, CTNNA1, DICER1, FANCC, FH, FLCN, GALNT12, KIF1B, LZTR1, MAX, MEN1, MET, MLH1*, [name]2*, [name]3, [name]6*, MUTYH*, NBN, NF1*, NF2, NTHL1, PALB2*, PHOX2B, PMS2*, POT1, PRKAR1A, PTCH1, PTEN*, RAD51C*, RAD51D*,RB1, RECQL, RET, SDHA, SDHAF2, SDHB, SDHC, SDHD, SMAD4, SMARCA4, SMARCB1, SMARCE1, STK11, SUFU, TMEM127, TP53*,TSC1, TSC2, VHL and XRCC2 (sequencing and deletion/duplication); EGFR, EGLN1, HOXB13, KIT, MITF, PDGFRA, POLD1 and POLE (sequencing only); EPCAM and GREM1 (deletion/duplication only).   08/28/2021 Imaging   EXAM: CT CHEST, ABDOMEN, AND PELVIS WITH CONTRAST  IMPRESSION: 1. Continued interval progression of multiple metastatic soft tissue nodules/masses in the abdomen and pelvis. 2. No evidence for metastatic disease in the chest. 3. Similar appearance of the subtle lesion in the L2 vertebral body, suspicious for metastatic involvement. 4. Cholelithiasis.   09/03/2021 -  Chemotherapy   Patient is on Treatment Plan : COLORECTAL Pembrolizumab (200) q21d     09/08/2021 - 02/01/2022 Chemotherapy   Patient is on Treatment Plan : COLORECTAL Pembrolizumab (200) q21d     01/08/2022 Imaging   CT CAP  IMPRESSION: 1. Mixed response to therapy. Interval decrease in size of the conglomerate multilobulated mass extending from the left psoas into the left posterior pararenal space through the left lateral abdominal wall along the tract of the previously placed percutaneous drainage catheter. Interval decrease in size of right adnexal mass and omental masses within the right lower quadrant of the abdomen. Interval development of peritoneal carcinomatosis with omental caking within  the left lower quadrant of the abdomen and left subdiaphragmatic region adjacent to the spleen and along the left pericolic gutter as well as development of ascites appearing loculated within the anterior peritoneum. 2. Interval development of mild left hydronephrosis secondary to stricturing of the mid left ureter. 3. Stable left adrenal metastasis. 4. Stable sclerotic lesion within the L2 vertebral body. No new lytic or blastic bone lesions are seen. 5. Cholelithiasis. 6. No evidence of intrathoracic metastatic disease. 7. 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 Moderate colonic stool burden. No evidence of obstruction.   03/15/2022 Imaging   IMPRESSION: 1. Status post sigmoid colon resection with left lower quadrant end colostomy. 2. Multicystic bilateral ovarian lesions are slightly increased in size. 3. Diminished, small volume of loculated appearing ascites throughout the abdomen and pelvis, with similar appearance of peritoneal thickening and nodularity throughout. 4. Heterogeneously calcified, conglomerate masses in the left retroperitoneum and abdominal wall are slightly diminished in size. 5. Left adrenal lesion not significantly changed. 6. Unchanged faintly sclerotic metastatic lesion of L2. 7. Constellation of findings is consistent with mixed response to treatment however with overall little interval change. 8. Minimal residual left hydronephrosis and proximal hydroureter, improved compared to prior examination. 9. Cholelithiasis. 10. Coronary artery disease, significantly advanced for patient age.   08/04/2022 Imaging    IMPRESSION: CT CHEST:   1. No developing mass lesion, fluid collection or lymph node enlargement in the thorax. Stable tiny 3 mm lung nodule. 2. Coronary artery calcifications. Please correlate for other coronary risk factors. 3. Chest port   CT ABDOMEN AND PELVIS:   1.  Extensive surgical changes identified. Left lower quadrant ostomy, diverting colostomy. Surgical changes along loops of small bowel in the right hemiabdomen. 2. Once again there are areas of nodular tissue extending along the left lateral abdominal wall, into the retroperitoneum which are similar to previous. The amount of adjacent fluid in these locations has improved. 3. No new mass lesion, fluid collection or lymph node enlargement in the abdomen or pelvis. 4. Gallstones. 5. Enlarged uterus with fibroids. Bilateral complex cystic areas are once again seen and based on appearance differential includes dilated fallopian tubes, hydrosalpinx versus true cystic lesions. Please correlate for any known history or prior workup 6. 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 No developing mass lesion, fluid collection or lymph node enlargement in the thorax. Stable tiny 3 mm lung nodule. 2. Coronary artery calcifications. Please correlate for other coronary risk factors. 3. Chest port   CT ABDOMEN AND PELVIS:   1. Extensive surgical changes identified. Left lower quadrant ostomy, diverting colostomy. Surgical changes along loops of small bowel in the right hemiabdomen. 2. Once again there  are areas of nodular tissue extending along the left lateral abdominal wall, into the retroperitoneum which are similar to previous. The amount of adjacent fluid in these locations has improved. 3. No new mass lesion, fluid collection or lymph node enlargement in the abdomen or pelvis. 4. Gallstones. 5. Enlarged uterus with fibroids. Bilateral complex cystic areas are once again  seen and based on appearance differential includes dilated fallopian tubes, hydrosalpinx versus true cystic lesions. Please correlate for any known history or prior workup 6. Limited evaluation for solid organ pathology metastatic disease without the advantage of IV contrast        INTERVAL HISTORY:  IVADELL SARRATT is here for a follow up of metastatic colon cancer. She was last seen by me on 01/26/2023. She presents to the clinic accompanied by husband. Pt state that she has some abdominal cramping. She also have some joint pain, the are tolerable, most days. Pt state that her fatigue is worse since the last treatment.      All other systems were reviewed with the patient and are negative.  MEDICAL HISTORY:  Past Medical History:  Diagnosis Date   Allergy    seasonal   Anemia    Blood infection 1985   Blood transfusion without reported diagnosis    colon ca 04/2021   Diverticulitis    Family history of breast cancer    Family history of colon cancer    Family history of colon cancer    Family history of stomach cancer    Hypertension    Obesity    Sleep apnea     SURGICAL HISTORY: Past Surgical History:  Procedure Laterality Date   COLECTOMY WITH COLOSTOMY CREATION/HARTMANN PROCEDURE N/A 04/15/2021   Procedure: COLOSTOMY CREATION/HARTMANN PROCEDURE;  Surgeon: [name], MD;  Location: [facility];  Service: General;  Laterality: N/A;   IR CATHETER TUBE CHANGE  12/12/2020   IR CATHETER TUBE CHANGE  01/27/2021   IR CATHETER TUBE CHANGE  02/19/2021   IR CATHETER TUBE CHANGE  04/13/2021   IR IMAGING GUIDED PORT INSERTION  10/02/2021   IR RADIOLOGIST EVAL & MGMT  11/27/2020   IR RADIOLOGIST EVAL & MGMT  12/11/2020   IR RADIOLOGIST EVAL & MGMT  01/07/2021   IR RADIOLOGIST EVAL & MGMT  02/04/2021   IR SINUS/FIST TUBE CHK-NON GI  12/12/2020   IR SINUS/FIST TUBE CHK-NON GI  02/19/2021   LAPAROSCOPIC PARTIAL COLECTOMY N/A 04/15/2021   Procedure: LAPAROSCOPIC ASSISTED HARTMANN  RESECTION;  Surgeon: [name], MD;  Location: [facility];  Service: General;  Laterality: N/A;    I have reviewed the social history and family history with the patient and they are unchanged from previous note.  ALLERGIES:  is allergic to chlorhexidine gluconate, shellfish allergy, and penicillins.  MEDICATIONS:  Current Outpatient Medications  Medication Sig Dispense Refill   ALPRAZolam (XANAX) 0.25 MG tablet Take 1 tablet (0.25 mg total) by mouth at bedtime as needed for anxiety. 30 tablet 0   amLODipine (NORVASC) 10 MG tablet TAKE 1 TABLET BY MOUTH EVERY DAY 90 tablet 3   furosemide (LASIX) 20 MG tablet TAKE 1 TABLET BY MOUTH EVERY DAY AS NEEDED (Patient not taking: Reported on 11/30/2022) 90 tablet 1   KLOR-CON M20 20 MEQ tablet TAKE 1 TABLET BY MOUTH EVERY DAY 90 tablet 1   levonorgestrel (MIRENA, 52 MG,) 20 MCG/DAY IUD Take 1 device by intrauterine route.     lidocaine-prilocaine (EMLA) cream Apply 1 Application topically as needed. 30 g 3  Metoprolol Tartrate 75 MG TABS Take 1 tablet (75 mg total) by mouth 2 (two) times daily. (Patient taking differently: Take 75 mg by mouth 2 (two) times daily. Takes 1/2 tablet by mouth 2x daily.) 90 tablet 3   POTASSIUM PO Take by mouth.     pregabalin (LYRICA) 75 MG capsule Take 1 capsule (75 mg total) by mouth at bedtime. X 1 week, then 75 mg BID x 1 week, then can increase to 75 in AM and 150 mg QHS- nerve pain 90 capsule 5   sulfamethoxazole-trimethoprim (BACTRIM DS) 800-160 MG tablet TAKE 1 TABLET(S) EVERY 12 HOURS BY ORAL ROUTE FOR 3 DAYS.     traZODone (DESYREL) 50 MG tablet TAKE 1-2 TABLETS BY MOUTH AT BEDTIME AS NEEDED FOR SLEEP. 180 tablet 1   No current facility-administered medications for this visit.   Facility-Administered Medications Ordered in Other Visits  Medication Dose Route Frequency Provider Last Rate Last Admin   fentaNYL (SUBLIMAZE) injection   Intravenous PRN Mir, Al Corpus, MD   50 mcg at 10/02/21 1436   midazolam  (VERSED) injection   Intravenous PRN Mir, Al Corpus, MD   1 mg at 10/02/21 1436   sodium chloride flush (NS) 0.9 % injection 10 mL  10 mL Intracatheter PRN Malachy Mood, MD   10 mL at 02/16/23 1504    PHYSICAL EXAMINATION: ECOG PERFORMANCE STATUS: 1 - Symptomatic but completely ambulatory  Vitals:   02/16/23 1325  BP: 128/87  Pulse: 78  Resp: 16  Temp: 98.4 F (36.9 C)  SpO2: 100%   Wt Readings from Last 3 Encounters:  02/16/23 (!) 300 lb 3.2 oz (136.2 kg)  01/26/23 299 lb 11.2 oz (135.9 kg)  01/05/23 296 lb 14.4 oz (134.7 kg)     GENERAL:alert, no distress and comfortable SKIN: skin color normal, no rashes or significant lesions EYES: normal, Conjunctiva are pink and non-injected, sclera clear  NEURO: alert & oriented x 3 with fluent speech  LABORATORY DATA:  I have reviewed the data as listed    Latest Ref Rng & Units 02/16/2023    1:02 PM 01/24/2023    2:51 PM 01/05/2023   11:40 AM  CBC  WBC 4.0 - 10.5 K/uL 7.1  5.7  5.9   Hemoglobin 12.0 - 15.0 g/dL 62.1  30.8  65.7   Hematocrit 36.0 - 46.0 % 36.6  35.1  37.7   Platelets 150 - 400 K/uL 472  413  415         Latest Ref Rng & Units 02/16/2023    1:02 PM 01/24/2023    2:51 PM 01/05/2023   11:40 AM  CMP  Glucose 70 - 99 mg/dL 94  91  90   BUN 6 - 20 mg/dL 30  15  13    Creatinine 0.44 - 1.00 mg/dL 8.46  9.62  9.52   Sodium 135 - 145 mmol/L 135  138  138   Potassium 3.5 - 5.1 mmol/L 4.4  3.8  4.3   Chloride 98 - 111 mmol/L 109  110  108   CO2 22 - 32 mmol/L 20  22  25    Calcium 8.9 - 10.3 mg/dL 8.9  8.5  9.0   Total Protein 6.5 - 8.1 g/dL 7.7  7.4  7.5   Total Bilirubin 0.3 - 1.2 mg/dL 0.3  0.3  0.3   Alkaline Phos 38 - 126 U/L 39  40  46   AST 15 - 41 U/L 11  12  13  ALT 0 - 44 U/L 11  9  10        RADIOGRAPHIC STUDIES: I have personally reviewed the radiological images as listed and agreed with the findings in the report. No results found.    Orders Placed This Encounter  Procedures   Ferritin     Standing Status:   Standing    Number of Occurrences:   20    Standing Expiration Date:   02/16/2024   All questions were answered. The patient knows to call the clinic with any problems, questions or concerns. No barriers to learning was detected. The total time spent in the appointment was 25 minutes.     Malachy Mood, MD 02/16/2023   Carolin Coy, CMA, am acting as scribe for Malachy Mood, MD.   I have reviewed the above documentation for accuracy and completeness, and I agree with the above.

## 2023-02-16 NOTE — Assessment & Plan Note (Signed)
-  blood transfusion if Hg<8.0 -patient reports struggling with some constipation with taking iron pills in the past. Will give IV iron as needed to help bolster iron levels, last 02/22/22.

## 2023-02-16 NOTE — Assessment & Plan Note (Signed)
-  MMR loss of [name]6, [name] with high mutation burden, acquired BRCA mutation (+)  --Initially presented with diarrhea 11/04/20, found to have diverticulitis with perforation and also developed an abscess requiring 2 drains, multiple hospital stay  --she started FOLFOX on 06/22/21 but did not respond -she switched to [facility] on 09/08/21. She has been tolerating well overall but has developed joint pain which could be related, overall manageable. She is clinically doing better also with more energy and less abdominal pain   -her CEA has normalized on immunotherapy. -CT CAP on 03/15/22 again showed mixed response -she had PET scan on 04/26/2022 for evaluation of her ovarian cysts, and saw GYN Dr. [name], we decided to monitor it. -She tolerating Keytruda well, with mild joint pain.  She recently started workout at the gym, which has helped her joint pain. -restaging CT from 08/04/2022 showed stable disease in left abdomen and pelvis, no new lesions.  I personally reviewed her scan images, and discussed findings with patient.   -Will continue Keytruda  -repeated CT scan on January 24, 2023 showed continuous response, no new lesions.

## 2023-02-16 NOTE — Progress Notes (Signed)
Per Dr. Mosetta Putt OK to proceed with tx without urine pregnancy result today, Pt unable to provide sample for testing.

## 2023-02-16 NOTE — Assessment & Plan Note (Signed)
-

## 2023-02-16 NOTE — Patient Instructions (Signed)

## 2023-02-22 ENCOUNTER — Encounter: Payer: Self-pay | Admitting: Hematology

## 2023-03-09 ENCOUNTER — Inpatient Hospital Stay: Payer: Managed Care, Other (non HMO) | Attending: Hematology | Admitting: Hematology

## 2023-03-09 ENCOUNTER — Encounter: Payer: Self-pay | Admitting: Hematology

## 2023-03-09 ENCOUNTER — Inpatient Hospital Stay: Payer: Managed Care, Other (non HMO)

## 2023-03-09 ENCOUNTER — Other Ambulatory Visit: Payer: Managed Care, Other (non HMO)

## 2023-03-09 ENCOUNTER — Ambulatory Visit: Payer: Managed Care, Other (non HMO)

## 2023-03-09 ENCOUNTER — Inpatient Hospital Stay: Payer: Managed Care, Other (non HMO) | Attending: Hematology

## 2023-03-09 VITALS — BP 156/92 | HR 76 | Temp 99.1°F | Resp 18 | Ht 63.0 in | Wt 307.8 lb

## 2023-03-09 VITALS — BP 127/94 | HR 71 | Temp 98.4°F | Resp 18

## 2023-03-09 DIAGNOSIS — Z803 Family history of malignant neoplasm of breast: Secondary | ICD-10-CM | POA: Diagnosis not present

## 2023-03-09 DIAGNOSIS — D3502 Benign neoplasm of left adrenal gland: Secondary | ICD-10-CM | POA: Diagnosis not present

## 2023-03-09 DIAGNOSIS — Z9221 Personal history of antineoplastic chemotherapy: Secondary | ICD-10-CM | POA: Diagnosis not present

## 2023-03-09 DIAGNOSIS — Z800 Family history of malignant neoplasm of digestive organs: Secondary | ICD-10-CM | POA: Insufficient documentation

## 2023-03-09 DIAGNOSIS — K8020 Calculus of gallbladder without cholecystitis without obstruction: Secondary | ICD-10-CM | POA: Diagnosis not present

## 2023-03-09 DIAGNOSIS — C187 Malignant neoplasm of sigmoid colon: Secondary | ICD-10-CM

## 2023-03-09 DIAGNOSIS — C7951 Secondary malignant neoplasm of bone: Secondary | ICD-10-CM | POA: Diagnosis not present

## 2023-03-09 DIAGNOSIS — D259 Leiomyoma of uterus, unspecified: Secondary | ICD-10-CM | POA: Diagnosis not present

## 2023-03-09 DIAGNOSIS — M62830 Muscle spasm of back: Secondary | ICD-10-CM | POA: Insufficient documentation

## 2023-03-09 DIAGNOSIS — Z5112 Encounter for antineoplastic immunotherapy: Secondary | ICD-10-CM | POA: Insufficient documentation

## 2023-03-09 DIAGNOSIS — Z452 Encounter for adjustment and management of vascular access device: Secondary | ICD-10-CM

## 2023-03-09 DIAGNOSIS — C786 Secondary malignant neoplasm of retroperitoneum and peritoneum: Secondary | ICD-10-CM | POA: Diagnosis not present

## 2023-03-09 DIAGNOSIS — C7972 Secondary malignant neoplasm of left adrenal gland: Secondary | ICD-10-CM | POA: Insufficient documentation

## 2023-03-09 DIAGNOSIS — J90 Pleural effusion, not elsewhere classified: Secondary | ICD-10-CM | POA: Insufficient documentation

## 2023-03-09 DIAGNOSIS — D500 Iron deficiency anemia secondary to blood loss (chronic): Secondary | ICD-10-CM

## 2023-03-09 DIAGNOSIS — M2550 Pain in unspecified joint: Secondary | ICD-10-CM | POA: Diagnosis not present

## 2023-03-09 DIAGNOSIS — Z79899 Other long term (current) drug therapy: Secondary | ICD-10-CM | POA: Insufficient documentation

## 2023-03-09 DIAGNOSIS — K5720 Diverticulitis of large intestine with perforation and abscess without bleeding: Secondary | ICD-10-CM | POA: Diagnosis not present

## 2023-03-09 DIAGNOSIS — K760 Fatty (change of) liver, not elsewhere classified: Secondary | ICD-10-CM | POA: Insufficient documentation

## 2023-03-09 LAB — CMP (CANCER CENTER ONLY)
ALT: 10 U/L (ref 0–44)
AST: 11 U/L — ABNORMAL LOW (ref 15–41)
Albumin: 3.8 g/dL (ref 3.5–5.0)
Alkaline Phosphatase: 44 U/L (ref 38–126)
Anion gap: 6 (ref 5–15)
BUN: 19 mg/dL (ref 6–20)
CO2: 24 mmol/L (ref 22–32)
Calcium: 8.9 mg/dL (ref 8.9–10.3)
Chloride: 109 mmol/L (ref 98–111)
Creatinine: 0.96 mg/dL (ref 0.44–1.00)
GFR, Estimated: >60 mL/min (ref >60)
Glucose, Bld: 104 mg/dL — ABNORMAL HIGH (ref 70–99)
Potassium: 3.6 mmol/L (ref 3.5–5.1)
Sodium: 139 mmol/L (ref 135–145)
Total Bilirubin: 0.2 mg/dL — ABNORMAL LOW (ref 0.3–1.2)
Total Protein: 7.1 g/dL (ref 6.5–8.1)

## 2023-03-09 LAB — CBC WITH DIFFERENTIAL (CANCER CENTER ONLY)
Abs Immature Granulocytes: 0.03 10*3/uL (ref 0.00–0.07)
Basophils Absolute: 0 10*3/uL (ref 0.0–0.1)
Basophils Relative: 1 %
Eosinophils Absolute: 0.1 10*3/uL (ref 0.0–0.5)
Eosinophils Relative: 1 %
HCT: 33.3 % — ABNORMAL LOW (ref 36.0–46.0)
Hemoglobin: 11.2 g/dL — ABNORMAL LOW (ref 12.0–15.0)
Immature Granulocytes: 0 %
Lymphocytes Relative: 27 %
Lymphs Abs: 1.8 10*3/uL (ref 0.7–4.0)
MCH: 28.1 pg (ref 26.0–34.0)
MCHC: 33.6 g/dL (ref 30.0–36.0)
MCV: 83.5 fL (ref 80.0–100.0)
Monocytes Absolute: 0.4 10*3/uL (ref 0.1–1.0)
Monocytes Relative: 6 %
Neutro Abs: 4.3 10*3/uL (ref 1.7–7.7)
Neutrophils Relative %: 65 %
Platelet Count: 400 10*3/uL (ref 150–400)
RBC: 3.99 MIL/uL (ref 3.87–5.11)
RDW: 14.8 % (ref 11.5–15.5)
WBC Count: 6.7 10*3/uL (ref 4.0–10.5)
nRBC: 0 % (ref 0.0–0.2)

## 2023-03-09 LAB — PREGNANCY, URINE: Preg Test, Ur: NEGATIVE

## 2023-03-09 LAB — FERRITIN: Ferritin: 11 ng/mL (ref 11–307)

## 2023-03-09 MED ORDER — SODIUM CHLORIDE 0.9 % IV SOLN
200.0000 mg | Freq: Once | INTRAVENOUS | Status: AC
  Administered 2023-03-09: 200 mg via INTRAVENOUS
  Filled 2023-03-09: qty 200

## 2023-03-09 MED ORDER — CYCLOBENZAPRINE HCL 5 MG PO TABS: 5.0000 mg | ORAL_TABLET | Freq: Three times a day (TID) | ORAL | 1 refills | Status: DC | PRN

## 2023-03-09 MED ORDER — SODIUM CHLORIDE 0.9 % IV SOLN: Freq: Once | INTRAVENOUS | Status: AC

## 2023-03-09 MED ORDER — HEPARIN SOD (PORK) LOCK FLUSH 100 UNIT/ML IV SOLN
500.0000 [IU] | Freq: Once | INTRAVENOUS | Status: AC | PRN
  Administered 2023-03-09: 500 [IU]

## 2023-03-09 MED ORDER — SODIUM CHLORIDE 0.9% FLUSH
10.0000 mL | Freq: Once | INTRAVENOUS | Status: AC
  Administered 2023-03-09: 10 mL

## 2023-03-09 MED ORDER — SODIUM CHLORIDE 0.9% FLUSH
10.0000 mL | INTRAVENOUS | Status: DC | PRN
  Administered 2023-03-09: 10 mL

## 2023-03-09 NOTE — Patient Instructions (Signed)

## 2023-03-09 NOTE — Assessment & Plan Note (Signed)
-  MMR loss of [name]6, [name] with high mutation burden, acquired BRCA mutation (+)  --Initially presented with diarrhea 11/04/20, found to have diverticulitis with perforation and also developed an abscess requiring 2 drains, multiple hospital stay  --she started FOLFOX on 06/22/21 but did not respond -she switched to [facility] on 09/08/21. She has been tolerating well overall but has developed joint pain which could be related, overall manageable. She is clinically doing better also with more energy and less abdominal pain   -her CEA has normalized on immunotherapy. -CT CAP on 03/15/22 again showed mixed response -she had PET scan on 04/26/2022 for evaluation of her ovarian cysts, and saw GYN Dr. [name], we decided to monitor it. -She tolerating Keytruda well, with mild joint pain.  She recently started workout at the gym, which has helped her joint pain. -restaging CT from 08/04/2022 showed stable disease in left abdomen and pelvis, no new lesions.  I personally reviewed her scan images, and discussed findings with patient.   -Will continue Keytruda  -repeated CT scan on January 24, 2023 showed continuous response, no new lesions.

## 2023-03-09 NOTE — Progress Notes (Signed)
[name]   [phone] Fax:([phone]) [phone]   Clinic Follow up Note   Patient Care Team: [name], MD as PCP - General (Internal Medicine) [name], MD as PCP - Cardiology (Cardiology) [name], [name] as Physician Assistant (Emergency Medicine) Malachy Mood, MD as Consulting Physician (Oncology) [name], MD as Consulting Physician (General Surgery) [name], [name], MD as Consulting Physician (Neurology) [name], [name], MD as Consulting Physician (Obstetrics and Gynecology) [name], MD as Consulting Physician (Gastroenterology) [name], [name], NP as Nurse Practitioner (Nurse Practitioner)  Date of Service:  03/09/2023  CHIEF COMPLAINT: f/u of metastatic colon cancer  CURRENT THERAPY:  Keytruda every 3 weeks  Oncology History   Malignant neoplasm of sigmoid colon [facility]) -MMR loss of [name]6, [facility] with high mutation burden, acquired BRCA mutation (+)  --Initially presented with diarrhea 11/04/20, found to have diverticulitis with perforation and also developed an abscess requiring 2 drains, multiple hospital stay  --she started FOLFOX on 06/22/21 but did not respond -she switched to [facility] on 09/08/21. She has been tolerating well overall but has developed joint pain which could be related, overall manageable. She is clinically doing better also with more energy and less abdominal pain   -her CEA has normalized on immunotherapy. -CT CAP on 03/15/22 again showed mixed response -she had PET scan on 04/26/2022 for evaluation of her ovarian cysts, and saw GYN Dr. [name], we decided to monitor it. -She tolerating Keytruda well, with mild joint pain.  She recently started workout at the gym, which has helped her joint pain. -restaging CT from 08/04/2022 showed stable disease in left abdomen and pelvis, no new lesions.  I personally reviewed her scan images, and discussed findings with patient.   -Will  continue Keytruda  -repeated CT scan on January 24, 2023 showed continuous response, no new lesions.  -She has developed more back spasm and abdominal cramps lately, I called in Flexeril for her.  She states that she had those symptoms before -Will repeat her tumor marker CEA, to see if we need to repeat her scans sooner.  Plan -Lab reviewed, adequate for treatment, will proceed Keytruda today and continue every 3 weeks -I called in Flexeril for her back spasm and abdominal cramps -Add CEA to next lab -Lab and follow-up with treatment in 3 weeks     SUMMARY OF ONCOLOGIC HISTORY: Oncology History Overview Note   Cancer Staging  Malignant neoplasm of sigmoid colon [facility]) Staging form: Colon and Rectum, AJCC 8th Edition - Pathologic stage from 04/15/2021: Stage IIC (pT4b, pN0, cM0) - Signed by Malachy Mood, MD on 06/12/2021    Malignant neoplasm of sigmoid colon [facility])   Initial Diagnosis   Malignant neoplasm of sigmoid colon (HCC)   11/04/2020 Imaging   EXAM: CT ABDOMEN AND PELVIS WITHOUT CONTRAST  IMPRESSION: 1. Perforating descending colonic diverticulitis with multiple abdominopelvic gas and fluid collections, measuring up to 5.7 cm and further described above. 2. Cholelithiasis without findings of acute cholecystitis. 3. 3.6 cm benign left adrenal adenoma. 4. Leiomyomatous uterus. 5. 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   11/07/2020 Imaging   EXAM: CT ABDOMEN AND PELVIS WITHOUT CONTRAST  IMPRESSION: Continued wall thickening is seen involving descending colon suggesting infectious or inflammatory colitis or perforated diverticulitis. There is an adjacent fluid collection measuring 7.0 x 5.0 cm consistent with abscess which is significantly enlarged compared to prior exam. Adjacent to the  abscess, there is a severely thickened small bowel loop most consistent with secondary  inflammation.   5.2 x 3.5 cm fluid collection is noted within the left psoas muscle consistent with abscess which is significantly enlarged compared to prior exam. 4.4 x 3.8 cm fluid collection consistent with abscess is noted in the left retroperitoneal region which is also enlarged compared to prior exam.   Fibroid uterus.   Cholelithiasis.   11/27/2020 Imaging   EXAM: CT ABDOMEN AND PELVIS WITHOUT CONTRAST  IMPRESSION: 1. Significantly interval decreased size of the previously visualized left retroperitoneal and left anterior mid abdominal fluid collections. There is persistent fat stranding and mural thickening of the mid descending colon at this level. 2. Unchanged leiomyomatous uterus. 3. Unchanged cholelithiasis.   12/11/2020 Imaging   EXAM: CT ABDOMEN AND PELVIS WITHOUT CONTRAST  IMPRESSION: 1. Stable inflammatory changes centered around the distal descending colon in the left lower abdomen. Pericolonic inflammatory changes have minimally changed since 11/27/2020. Small pocket of gas medial to the colon is probably associated with a fistula or small residual abscess collection. 2. Stable position of the two percutaneous drains. The more anterior drain may be extending through a portion of the small bowel. 3. Cholelithiasis. 4. Fibroid uterus. Cannot exclude an ovarian/adnexal cystic structure near the uterine fundus. 5. Left adrenal adenoma.   01/07/2021 Imaging   EXAM: CT ABDOMEN AND PELVIS WITH CONTRAST  IMPRESSION: 1. No new abscesses identified. Similar degree of soft tissue thickening seen in the left pericolonic region. The degree of persistent soft tissues thickening in the pericolonic space further raises suspicions for malignancy. Further evaluation with colonoscopy should be performed. 2. Left retroperitoneal abscess drain unchanged in position. Anterior left abdominal drain again seen terminating within small bowel loop. 3. 2.6 cm mildly sclerotic lesion  noted in the L2 vertebral body. Further evaluation with contrast enhanced lumbar spine MRI should be performed.   02/04/2021 Imaging   EXAM: CT ABDOMEN AND PELVIS WITH CONTRAST  IMPRESSION: No new abdominopelvic collections or abscess development in the 1 month interval.   Left anterior drain remains within a loop of small bowel, unchanged.   Left lateral abscess drain remains in the retroperitoneal space adjacent to the iliopsoas muscle with a small amount of residual fluid and air but no measurable collection.   Stable soft tissue prominence and pericolonic strandy edema/inflammation about the left descending colon compatible with residual diverticulitis/colitis. Difficult to exclude underlying transmural lesion.   04/01/2021 Imaging   EXAM: CT ABDOMEN AND PELVIS WITH CONTRAST  IMPRESSION: There appears to be significant enhancement and wall thickening involving the descending colon with some degree of traction and involvement of adjacent small bowel loops. This is consistent with the history of diverticulitis and perforation. Stable position of percutaneous drainage catheter is seen adjacent to left psoas muscle with no significant residual fluid remaining. The other percutaneous drainage catheter that was previously noted to be within small bowel loop on prior exam in the left lower quadrant, has significantly retracted and appears to be outside of the peritoneal space at this time.   Since the prior exam, there does appear to be some degree of rotation involving mesenteric vessels and structures in the right lower quadrant, suggesting partial volvulus or malrotation. There is the interval development of several lymph nodes in this area, most likely inflammatory or reactive in etiology. Mild amount of free fluid is also noted in the pelvis. However, no significant bowel wall thickening or dilatation is seen in this area. These results  will be called to the ordering clinician  or representative by the Radiologist Assistant, and communication documented in the PACS or zVision Dashboard.   Stable uterine fibroid.   Hepatic steatosis.   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   Cholelithiasis.   04/10/2021 Imaging   EXAM: CT ANGIOGRAPHY CHEST CT ABDOMEN AND PELVIS WITH CONTRAST  IMPRESSION: 1. Again seen are findings compatible with descending colon diverticulitis with perforation. Free air and inflammation have increased in the interval. 2. New lobulated enhancing fluid collection posterior to this segment of inflamed colon measuring 8.5 x 3.5 x 10.0 cm. This collection now invades the adjacent iliopsoas muscle as well as extends through the left lateral abdominal wall. The tip of the drainage catheter is in this collection. Findings are compatible with abscess. 3. Anterior left percutaneous drainage catheter tip has been pulled back and is now within the subcutaneous tissues. 4. Trace free fluid. 5. No pulmonary embolism.  No acute cardiopulmonary process. 6. Cholelithiasis. 7. Fatty infiltration of the liver.   04/15/2021 Definitive Surgery   FINAL MICROSCOPIC DIAGNOSIS:   A. SMALL BOWEL, RESECTION:  - Adenocarcinoma.  - No carcinoma identified in 1 lymph node.   B. PERFORATED LEFT COLON, RESECTION:  - Moderately differentiated colonic adenocarcinoma.  - Tumor extends into pericolonic adipose tissue, and is strongly  suggestive of invasion into small bowel.  See oncology table/comments.  - No carcinoma identified in 4 lymph nodes (0/4).  - Tubular adenoma with high-grade dysplasia, 1.  - Tubular adenomas with low grade dysplasia, 3.   Comments: The size of the tumor is difficult to estimate secondary to the disrupted nature of the specimen, as well as the infiltrative nature of the tumor.  Tumor can be identified as definitely invading into the pericolonic adipose tissue (block B4), but given the extreme disruption of the tissue, and the  involvement of the small bowel by what appears to be a colonic adenocarcinoma, I favor perforation of the large bowel with direct invasion into the small bowel.  Accordingly, I believe this is best regarded as a pT4b lesion.   ADDENDUM:  Mismatch Repair Protein (IHC)  SUMMARY INTERPRETATION: ABNORMAL  There is loss of the major MMR protein [name]6: This indicates a high probability that a hereditary germline mutation is present and referral to genetic counseling is warranted. It is recommended that the loss of protein expression be correlated with molecular based microsatellite instability testing.   IHC EXPRESSION RESULTS  TEST           RESULT  MLH1:          Preserved nuclear expression  [name]2:          Preserved nuclear expression  [name]6:          LOSS OF NUCLEAR EXPRESSION  PMS2:          Preserved nuclear expression    04/15/2021 Cancer Staging   Staging form: Colon and Rectum, AJCC 8th Edition - Pathologic stage from 04/15/2021: Stage IIC (pT4b, pN0, cM0) - Signed by Malachy Mood, MD on 06/12/2021 Stage prefix: Initial diagnosis Total positive nodes: 0 Histologic grading system: 4 grade system Histologic grade (G): G2 Residual tumor (R): R0 - None   04/29/2021 Imaging   EXAM: CT ABDOMEN AND PELVIS WITH CONTRAST  IMPRESSION: Continued left retroperitoneal abscesses inferior to the left kidney, involving the left psoas muscle and left abdominal wall musculature. Overall size is decreased since prior study. Interval removal of left lower quadrant abscess drainage catheter.  New fluid collection in the cul-de-sac and wrapping around the uterus concerning for abscess.   Cholelithiasis.   Small left pleural effusion.  Bibasilar atelectasis.   Stable left adrenal adenoma.  ADDENDUM: After discussing the case with Dr. Bryn Gulling in interventional radiology, it was noted that the fluid in the pelvis originally thought to be in the cul-de-sac is likely within the vagina, best seen on  sagittal imaging. Recommend speculum exam.   Also, in the left lateral wall in the area of prior abscess in the lateral wall abdominal musculature, some of the abdomen soft tissue appears to be enhancing. While this could be infectious, cannot exclude tumor seeding in the left lateral abdominal wall, measuring 6.9 x 3.8 cm on image 64 of series 2.   06/16/2021 Imaging   EXAM: CT ABDOMEN AND PELVIS WITH CONTRAST  IMPRESSION: Increased size of bulky soft tissue masses involving the left psoas muscle and left lateral abdominal wall soft tissues, consistent with progressive metastatic disease.   Significant progression of multiple peritoneal masses throughout the abdomen and pelvis, consistent with peritoneal carcinomatosis.   New mild retroperitoneal lymphadenopathy, consistent with metastatic disease.   Stable uterine fibroid and left adrenal adenoma.   Cholelithiasis, without evidence of cholecystitis.   06/22/2021 - 08/19/2021 Chemotherapy   Patient is on Treatment Plan : COLORECTAL FOLFOX q14d x 6 months     06/22/2021 Tumor Marker   Patient's tumor was tested for the following markers: CEA. Results of the tumor marker test revealed 87.41.   07/10/2021 Pathology Results   FINAL MICROSCOPIC DIAGNOSIS:   A. PERITONEAL MASS, LEFT UPPER ABDOMINAL QUADRANT, BIOPSY:  -  Metastatic adenocarcinoma with necrosis, histologically consistent with colon primary.     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 The report date is 07/16/2021. The remainder of testing was negative/normal.  The CancerNext-Expanded + RNAinsight gene panel offered by [employer] and includes sequencing and rearrangement analysis for the following 77 genes: IP, ALK, APC*, ATM*, AXIN2, BAP1, BARD1, BLM, BMPR1A, BRCA1*, BRCA2*, BRIP1*, CDC73, CDH1*,CDK4, CDKN1B, CDKN2A, CHEK2*, CTNNA1, DICER1, FANCC, FH, FLCN, GALNT12, KIF1B, LZTR1, MAX, MEN1, MET,  MLH1*, [name]2*, [name]3, [name]6*, MUTYH*, NBN, NF1*, NF2, NTHL1, PALB2*, PHOX2B, PMS2*, POT1, PRKAR1A, PTCH1, PTEN*, RAD51C*, RAD51D*,RB1, RECQL, RET, SDHA, SDHAF2, SDHB, SDHC, SDHD, SMAD4, SMARCA4, SMARCB1, SMARCE1, STK11, SUFU, TMEM127, TP53*,TSC1, TSC2, VHL and XRCC2 (sequencing and deletion/duplication); EGFR, EGLN1, HOXB13, KIT, MITF, PDGFRA, POLD1 and POLE (sequencing only); EPCAM and GREM1 (deletion/duplication only).   08/28/2021 Imaging   EXAM: CT CHEST, ABDOMEN, AND PELVIS WITH CONTRAST  IMPRESSION: 1. Continued interval progression of multiple metastatic soft tissue nodules/masses in the abdomen and pelvis. 2. No evidence for metastatic disease in the chest. 3. Similar appearance of the subtle lesion in the L2 vertebral body, suspicious for metastatic involvement. 4. Cholelithiasis.   09/03/2021 -  Chemotherapy   Patient is on Treatment Plan : COLORECTAL Pembrolizumab (200) q21d     09/08/2021 - 02/01/2022 Chemotherapy   Patient is on Treatment Plan : COLORECTAL Pembrolizumab (200) q21d     01/08/2022 Imaging   CT CAP IMPRESSION: 1. Mixed response to therapy. Interval decrease in size of the conglomerate multilobulated mass extending from the left psoas into the left posterior pararenal space through the left lateral abdominal wall along the tract of the previously placed percutaneous drainage catheter. Interval decrease in size of right adnexal mass and omental masses within the right lower quadrant of the abdomen. Interval development of peritoneal carcinomatosis  with omental caking within the left lower quadrant of the abdomen and left subdiaphragmatic region adjacent to the spleen and along the left pericolic gutter as well as development of ascites appearing loculated within the anterior peritoneum. 2. Interval development of mild left hydronephrosis secondary to stricturing of the mid left ureter. 3. Stable left adrenal metastasis. 4. Stable sclerotic lesion within the L2 vertebral body.  No new lytic or blastic bone lesions are seen. 5. Cholelithiasis. 6. No evidence of intrathoracic metastatic disease. 7. 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 Moderate colonic stool burden. No evidence of obstruction.   03/15/2022 Imaging   IMPRESSION: 1. Status post sigmoid colon resection with left lower quadrant end colostomy. 2. Multicystic bilateral ovarian lesions are slightly increased in size. 3. Diminished, small volume of loculated appearing ascites throughout the abdomen and pelvis, with similar appearance of peritoneal thickening and nodularity throughout. 4. Heterogeneously calcified, conglomerate masses in the left retroperitoneum and abdominal wall are slightly diminished in size. 5. Left adrenal lesion not significantly changed. 6. Unchanged faintly sclerotic metastatic lesion of L2. 7. Constellation of findings is consistent with mixed response to treatment however with overall little interval change. 8. Minimal residual left hydronephrosis and proximal hydroureter, improved compared to prior examination. 9. Cholelithiasis. 10. Coronary artery disease, significantly advanced for patient age.   08/04/2022 Imaging    IMPRESSION: CT CHEST:   1. No developing mass lesion, fluid collection or lymph node enlargement in the thorax. Stable tiny 3 mm lung nodule. 2. Coronary artery calcifications. Please correlate for other coronary risk factors. 3. Chest port   CT ABDOMEN AND PELVIS:   1. Extensive surgical changes identified. Left lower quadrant ostomy, diverting colostomy. Surgical changes along loops of small bowel in the right hemiabdomen. 2. Once again there are areas of nodular tissue extending along the left lateral abdominal wall, into the retroperitoneum which are similar to previous. The amount of adjacent fluid in these locations has improved. 3. No new mass lesion, fluid  collection or lymph node enlargement in the abdomen or pelvis. 4. Gallstones. 5. Enlarged uterus with fibroids. Bilateral complex cystic areas are once again seen and based on appearance differential includes dilated fallopian tubes, hydrosalpinx versus true cystic lesions. Please correlate for any known history or prior workup 6. 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 No developing mass lesion, fluid collection or lymph node enlargement in the thorax. Stable tiny 3 mm lung nodule. 2. Coronary artery calcifications. Please correlate for other coronary risk factors. 3. Chest port   CT ABDOMEN AND PELVIS:   1. Extensive surgical changes identified. Left lower quadrant ostomy, diverting colostomy. Surgical changes along loops of small bowel in the right hemiabdomen. 2. Once again there are areas of nodular tissue extending along the left lateral abdominal wall, into the retroperitoneum which are similar to previous. The amount of adjacent fluid in these locations has improved. 3. No new mass lesion, fluid collection or lymph node enlargement in the abdomen or pelvis. 4. Gallstones. 5. Enlarged uterus with fibroids. Bilateral complex cystic areas are once again seen and based on appearance differential  includes dilated fallopian tubes, hydrosalpinx versus true cystic lesions. Please correlate for any known history or prior workup 6. Limited evaluation for solid organ pathology metastatic disease without the advantage of IV contrast       Interval history Mikayla Rios is here for her scheduled follow-up and Keytruda treatment.  She presents to clinic with her  husband today.  She is tolerating treatment well.  She noticed her back spasm has been more frequent lately, happens on daily basis.  She also has intermittent abdominal cramps, which happens once every few days.  It lasts about 5 to 6 minutes and resolves on its own.  She is not constipated.  No nausea.  She is eating well overall, no fever or other new symptoms.       All other systems were reviewed with the patient and are negative.  MEDICAL HISTORY:  Past Medical History:  Diagnosis Date   Allergy    seasonal   Anemia    Blood infection 1985   Blood transfusion without reported diagnosis    colon ca 04/2021   Diverticulitis    Family history of breast cancer    Family history of colon cancer    Family history of colon cancer    Family history of stomach cancer    Hypertension    Obesity    Sleep apnea     SURGICAL HISTORY: Past Surgical History:  Procedure Laterality Date   COLECTOMY WITH COLOSTOMY CREATION/HARTMANN PROCEDURE N/A 04/15/2021   Procedure: COLOSTOMY CREATION/HARTMANN PROCEDURE;  Surgeon: [name], MD;  Location: [facility];  Service: General;  Laterality: N/A;   IR CATHETER TUBE CHANGE  12/12/2020   IR CATHETER TUBE CHANGE  01/27/2021   IR CATHETER TUBE CHANGE  02/19/2021   IR CATHETER TUBE CHANGE  04/13/2021   IR IMAGING GUIDED PORT INSERTION  10/02/2021   IR RADIOLOGIST EVAL & MGMT  11/27/2020   IR RADIOLOGIST EVAL & MGMT  12/11/2020   IR RADIOLOGIST EVAL & MGMT  01/07/2021   IR RADIOLOGIST EVAL & MGMT  02/04/2021   IR SINUS/FIST TUBE CHK-NON GI  12/12/2020   IR SINUS/FIST TUBE CHK-NON GI  02/19/2021   LAPAROSCOPIC PARTIAL COLECTOMY N/A 04/15/2021   Procedure: LAPAROSCOPIC ASSISTED HARTMANN RESECTION;  Surgeon: [name], MD;  Location: [facility];  Service: General;  Laterality: N/A;    I have reviewed the social history and family history with the patient and they are unchanged from previous note.  ALLERGIES:  is allergic to chlorhexidine gluconate,  shellfish allergy, and penicillins.  MEDICATIONS:  Current Outpatient Medications  Medication Sig Dispense Refill   cyclobenzaprine (FLEXERIL) 5 MG tablet Take 1-2 tablets (5-10 mg total) by mouth 3 (three) times daily as needed for muscle spasms. 60 tablet 1   ALPRAZolam (XANAX) 0.25 MG tablet Take 1 tablet (0.25 mg total) by mouth at bedtime as needed for anxiety. 30 tablet 0   amLODipine (NORVASC) 10 MG tablet TAKE 1 TABLET BY MOUTH EVERY DAY 90 tablet 3   furosemide (LASIX) 20 MG tablet TAKE 1 TABLET BY MOUTH EVERY DAY AS NEEDED (Patient not taking: Reported on 11/30/2022) 90 tablet 1   KLOR-CON M20 20 MEQ tablet TAKE 1 TABLET BY MOUTH EVERY DAY 90 tablet 1   levonorgestrel (MIRENA, 52 MG,) 20 MCG/DAY IUD Take 1 device by intrauterine route.     lidocaine-prilocaine (EMLA) cream Apply 1 Application topically as needed. 30 g 3   Metoprolol Tartrate 75 MG TABS Take 1 tablet (75 mg total) by  mouth 2 (two) times daily. (Patient taking differently: Take 75 mg by mouth 2 (two) times daily. Takes 1/2 tablet by mouth 2x daily.) 90 tablet 3   POTASSIUM PO Take by mouth.     pregabalin (LYRICA) 75 MG capsule Take 1 capsule (75 mg total) by mouth at bedtime. X 1 week, then 75 mg BID x 1 week, then can increase to 75 in AM and 150 mg QHS- nerve pain 90 capsule 5   sulfamethoxazole-trimethoprim (BACTRIM DS) 800-160 MG tablet TAKE 1 TABLET(S) EVERY 12 HOURS BY ORAL ROUTE FOR 3 DAYS.     traZODone (DESYREL) 50 MG tablet TAKE 1-2 TABLETS BY MOUTH AT BEDTIME AS NEEDED FOR SLEEP. 180 tablet 1   No current facility-administered medications for this visit.   Facility-Administered Medications Ordered in Other Visits  Medication Dose Route Frequency Provider Last Rate Last Admin   0.9 %  sodium chloride infusion   Intravenous Once Malachy Mood, MD       fentaNYL (SUBLIMAZE) injection   Intravenous PRN Mir, Al Corpus, MD   50 mcg at 10/02/21 1436   heparin lock flush 100 unit/mL  500 Units Intracatheter Once PRN  Malachy Mood, MD       midazolam (VERSED) injection   Intravenous PRN Mir, Al Corpus, MD   1 mg at 10/02/21 1436   pembrolizumab (KEYTRUDA) 200 mg in sodium chloride 0.9 % 50 mL chemo infusion  200 mg Intravenous Once Malachy Mood, MD       sodium chloride flush (NS) 0.9 % injection 10 mL  10 mL Intracatheter PRN Malachy Mood, MD        PHYSICAL EXAMINATION: ECOG PERFORMANCE STATUS: 1 - Symptomatic but completely ambulatory  Vitals:   03/09/23 1504 03/09/23 1505  BP: (!) 143/91 (!) 156/92  Pulse: 76   Resp: 18   Temp: 99.1 F (37.3 C)   SpO2: 100%    Wt Readings from Last 3 Encounters:  03/09/23 (!) 307 lb 12.8 oz (139.6 kg)  02/16/23 (!) 300 lb 3.2 oz (136.2 kg)  01/26/23 299 lb 11.2 oz (135.9 kg)     GENERAL:alert, no distress and comfortable SKIN: skin color, texture, turgor are normal, no rashes or significant lesions EYES: normal, Conjunctiva are pink and non-injected, sclera clear NECK: supple, thyroid normal size, non-tender, without nodularity LYMPH:  no palpable lymphadenopathy in the cervical, axillary  LUNGS: clear to auscultation and percussion with normal breathing effort HEART: regular rate & rhythm and no murmurs and no lower extremity edema ABDOMEN:abdomen soft, non-tender and normal bowel sounds Musculoskeletal:no cyanosis of digits and no clubbing  NEURO: alert & oriented x 3 with fluent speech, no focal motor/sensory deficits    LABORATORY DATA:  I have reviewed the data as listed    Latest Ref Rng & Units 03/09/2023    2:01 PM 02/16/2023    1:02 PM 01/24/2023    2:51 PM  CBC  WBC 4.0 - 10.5 K/uL 6.7  7.1  5.7   Hemoglobin 12.0 - 15.0 g/dL 09.8  11.9  14.7   Hematocrit 36.0 - 46.0 % 33.3  36.6  35.1   Platelets 150 - 400 K/uL 400  472  413         Latest Ref Rng & Units 03/09/2023    2:01 PM 02/16/2023    1:02 PM 01/24/2023    2:51 PM  CMP  Glucose 70 - 99 mg/dL 829  94  91   BUN 6 - 20 mg/dL 19  30  15   Creatinine 0.44 - 1.00 mg/dL 7.82  9.56  2.13    Sodium 135 - 145 mmol/L 139  135  138   Potassium 3.5 - 5.1 mmol/L 3.6  4.4  3.8   Chloride 98 - 111 mmol/L 109  109  110   CO2 22 - 32 mmol/L 24  20  22    Calcium 8.9 - 10.3 mg/dL 8.9  8.9  8.5   Total Protein 6.5 - 8.1 g/dL 7.1  7.7  7.4   Total Bilirubin 0.3 - 1.2 mg/dL 0.2  0.3  0.3   Alkaline Phos 38 - 126 U/L 44  39  40   AST 15 - 41 U/L 11  11  12    ALT 0 - 44 U/L 10  11  9        RADIOGRAPHIC STUDIES: I have personally reviewed the radiological images as listed and agreed with the findings in the report. No results found.    Orders Placed This Encounter  Procedures   CEA (IN HOUSE-[facility])    Standing Status:   Standing    Number of Occurrences:   20    Standing Expiration Date:   03/08/2024   All questions were answered. The patient knows to call the clinic with any problems, questions or concerns. No barriers to learning was detected. The total time spent in the appointment was 25 minutes.     Malachy Mood, MD 03/09/2023

## 2023-03-25 ENCOUNTER — Encounter: Payer: Self-pay | Admitting: Hematology

## 2023-03-28 ENCOUNTER — Encounter: Payer: Self-pay | Admitting: Internal Medicine

## 2023-03-29 ENCOUNTER — Ambulatory Visit (INDEPENDENT_AMBULATORY_CARE_PROVIDER_SITE_OTHER): Payer: Self-pay | Admitting: Internal Medicine

## 2023-03-29 VITALS — BP 130/84 | HR 85 | Temp 98.0°F | Ht 63.0 in | Wt 304.0 lb

## 2023-03-29 DIAGNOSIS — R600 Localized edema: Secondary | ICD-10-CM | POA: Insufficient documentation

## 2023-03-29 DIAGNOSIS — G8929 Other chronic pain: Secondary | ICD-10-CM | POA: Insufficient documentation

## 2023-03-29 DIAGNOSIS — M791 Myalgia, unspecified site: Secondary | ICD-10-CM | POA: Diagnosis not present

## 2023-03-29 DIAGNOSIS — N62 Hypertrophy of breast: Secondary | ICD-10-CM | POA: Diagnosis not present

## 2023-03-29 DIAGNOSIS — M546 Pain in thoracic spine: Secondary | ICD-10-CM | POA: Insufficient documentation

## 2023-03-29 MED ORDER — METHOCARBAMOL 750 MG PO TABS: 750.0000 mg | ORAL_TABLET | Freq: Three times a day (TID) | ORAL | 0 refills | Status: DC | PRN

## 2023-03-29 NOTE — Assessment & Plan Note (Signed)
Pt advised this is a common side effect of amlodipine, something she takes for HTN. She is encouraged to gradually increase her daily activity.

## 2023-03-29 NOTE — Progress Notes (Signed)
I,Victoria T Deloria Lair, CMA,acting as a Neurosurgeon for Gwynneth Aliment, MD.,have documented all relevant documentation on the behalf of Gwynneth Aliment, MD,as directed by  Gwynneth Aliment, MD while in the presence of Gwynneth Aliment, MD.  Subjective:  Patient ID: Mikayla Rios , female    DOB: [birth year] , 37 y.o.   MRN: [id number]  Chief Complaint  Patient presents with   Back Pain    HPI  Patient presents today for further evaluation of muscle spasms. She reports this initially started 3 weeks ago.  She is not sure what may have triggered her sx, she initially thought she had low potassium. However, her labs showed a normal potassium level.  She states her oncologist prescribed cyclobenzaprine which was not effective. She has also tried topical pain gel, heating pads & massages without relief of her symptoms.   She reports the pain initially started in her stomach and then radiated to her back.  Pain is located in her midback, around her bra strap.  The abdominal cramping has ceased; however, her back pain has persisted.         Past Medical History:  Diagnosis Date   Allergy    seasonal   Anemia    Blood infection 1985   Blood transfusion without reported diagnosis    colon ca 04/2021   Diverticulitis    Family history of breast cancer    Family history of colon cancer    Family history of colon cancer    Family history of stomach cancer    Hypertension    Obesity    Sleep apnea      Family History  Problem Relation Age of Onset   Diabetes Mother    Hypertension Mother    Colon cancer Mother        dx at age 69   Breast cancer Mother    Congestive Heart Failure Father    Hypertension Father    Diabetes Maternal Grandmother    Hypertension Maternal Grandmother    Diabetes Maternal Grandfather    Kidney disease Maternal Grandfather    Diabetes Paternal Grandmother    Autism Son    Stomach cancer Maternal Uncle    Heart disease Paternal Aunt    Colon polyps Half-Sister         colectomy   Ovarian cancer Neg Hx    Endometrial cancer Neg Hx    Pancreatic cancer Neg Hx    Prostate cancer Neg Hx      Current Outpatient Medications:    ALPRAZolam (XANAX) 0.25 MG tablet, Take 1 tablet (0.25 mg total) by mouth at bedtime as needed for anxiety., Disp: 30 tablet, Rfl: 0   amLODipine (NORVASC) 10 MG tablet, TAKE 1 TABLET BY MOUTH EVERY DAY, Disp: 90 tablet, Rfl: 3   levonorgestrel (MIRENA, 52 MG,) 20 MCG/DAY IUD, Take 1 device by intrauterine route., Disp: , Rfl:    lidocaine-prilocaine (EMLA) cream, Apply 1 Application topically as needed., Disp: 30 g, Rfl: 3   methocarbamol (ROBAXIN-750) 750 MG tablet, Take 1 tablet (750 mg total) by mouth every 8 (eight) hours as needed for muscle spasms., Disp: 30 tablet, Rfl: 0   furosemide (LASIX) 20 MG tablet, TAKE 1 TABLET BY MOUTH EVERY DAY AS NEEDED (Patient not taking: Reported on 11/30/2022), Disp: 90 tablet, Rfl: 1   KLOR-CON M20 20 MEQ tablet, TAKE 1 TABLET BY MOUTH EVERY DAY (Patient not taking: Reported on 03/29/2023), Disp: 90 tablet, Rfl: 1   Metoprolol  Tartrate 75 MG TABS, Take 1 tablet (75 mg total) by mouth 2 (two) times daily. (Patient taking differently: Take 75 mg by mouth 2 (two) times daily. Takes 1/2 tablet by mouth 2x daily.), Disp: 90 tablet, Rfl: 3   POTASSIUM PO, Take by mouth. (Patient not taking: Reported on 03/29/2023), Disp: , Rfl:    pregabalin (LYRICA) 75 MG capsule, Take 1 capsule (75 mg total) by mouth at bedtime. X 1 week, then 75 mg BID x 1 week, then can increase to 75 in AM and 150 mg QHS- nerve pain (Patient not taking: Reported on 03/29/2023), Disp: 90 capsule, Rfl: 5   sulfamethoxazole-trimethoprim (BACTRIM DS) 800-160 MG tablet, TAKE 1 TABLET(S) EVERY 12 HOURS BY ORAL ROUTE FOR 3 DAYS. (Patient not taking: Reported on 03/29/2023), Disp: , Rfl:    traZODone (DESYREL) 50 MG tablet, TAKE 1-2 TABLETS BY MOUTH AT BEDTIME AS NEEDED FOR SLEEP. (Patient not taking: Reported on 03/29/2023), Disp: 180  tablet, Rfl: 1 No current facility-administered medications for this visit.  Facility-Administered Medications Ordered in Other Visits:    fentaNYL (SUBLIMAZE) injection, , Intravenous, PRN, Mir, Al Corpus, MD, 50 mcg at 10/02/21 1436   midazolam (VERSED) injection, , Intravenous, PRN, Mir, Al Corpus, MD, 1 mg at 10/02/21 1436   Allergies  Allergen Reactions   Chlorhexidine Gluconate Hives and Itching    CHG wipes cause itching and hives requiring benadryl   Shellfish Allergy Hives   Penicillins Hives and Rash    Has patient had a PCN reaction causing immediate rash, facial/tongue/throat swelling, SOB or lightheadedness with hypotension: No Has patient had a PCN reaction causing severe rash involving mucus membranes or skin necrosis: hives Has patient had a PCN reaction that required hospitalization No Has patient had a PCN reaction occurring within the last 10 years: yes If all of the above answers are "NO", then may proceed with Cephalosporin use.      Review of Systems  Constitutional: Negative.   Respiratory: Negative.    Cardiovascular:  Positive for leg swelling.  Musculoskeletal:  Positive for back pain.  Neurological: Negative.   Psychiatric/Behavioral: Negative.       Today's Vitals   03/29/23 1606  BP: 130/84  Pulse: 85  Temp: 98 F (36.7 C)  SpO2: 98%  Weight: (!) 304 lb (137.9 kg)  Height: 5\' 3"  (1.6 m)   Body mass index is 53.85 kg/m.  Wt Readings from Last 3 Encounters:  03/29/23 (!) 304 lb (137.9 kg)  03/09/23 (!) 307 lb 12.8 oz (139.6 kg)  02/16/23 (!) 300 lb 3.2 oz (136.2 kg)     Objective:  Physical Exam Vitals and nursing note reviewed.  Constitutional:      Appearance: Normal appearance. She is obese.  HENT:     Head: Normocephalic and atraumatic.  Eyes:     Extraocular Movements: Extraocular movements intact.  Cardiovascular:     Rate and Rhythm: Normal rate and regular rhythm.     Pulses: Normal pulses.     Heart sounds: Normal heart  sounds.  Pulmonary:     Effort: Pulmonary effort is normal.     Breath sounds: Normal breath sounds.  Genitourinary:    Comments: deferred Musculoskeletal:        General: Tenderness present. Normal range of motion.     Cervical back: Normal range of motion and neck supple.     Comments: Midback tenderness, no pinpoint tenderness, no overlying erythema Indentations at shoulders  No pitting edema noted on exam  Skin:    General: Skin is warm and dry.  Neurological:     General: No focal deficit present.     Mental Status: She is alert and oriented to person, place, and time.  Psychiatric:        Mood and Affect: Mood normal.        Behavior: Behavior normal.         Assessment And Plan:  Myalgia Assessment & Plan: Most recent labs including electrolytes reviewed. She is encouraged to stay well hydrated. May benefit from Mg supplementation.    Acute midline thoracic back pain Assessment & Plan: Her sx are likely exacerbated by macromastia. She was given samples of Salonpas lidocaine patches to apply to affected area as needed. I will also send rx Robaxin to use prn. If sx persist, will consider PT evaluation. She is in agreement with her treatment plan.    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 She is encouraged to consider getting measured at [facility]. She does not wish to consider b/l breast reduction at this time.    Lower extremity edema Assessment & Plan: Pt advised this is a common side effect of amlodipine, something she takes for HTN. She is encouraged to gradually increase her daily activity.    Other orders -     Methocarbamol; Take 1 tablet (750 mg total) by mouth every 8 (eight) hours as needed for muscle spasms.  Dispense: 30 tablet; Refill: 0     Return if symptoms worsen or fail to improve.  Patient was given opportunity to ask questions. Patient verbalized understanding of the plan and was  able to repeat key elements of the plan. All questions were answered to their satisfaction.    I, Gwynneth Aliment, MD, have reviewed all documentation for this visit. The documentation on 03/29/23 for the exam, diagnosis, procedures, and orders are all accurate and complete.   IF YOU HAVE BEEN REFERRED TO A SPECIALIST, IT MAY TAKE 1-2 WEEKS TO SCHEDULE/PROCESS THE REFERRAL. IF YOU HAVE NOT HEARD FROM US/SPECIALIST IN TWO WEEKS, PLEASE GIVE [place] A CALL AT [phone].   THE PATIENT IS ENCOURAGED TO PRACTICE SOCIAL DISTANCING DUE TO THE COVID-19 PANDEMIC.

## 2023-03-29 NOTE — Patient Instructions (Signed)
Acute Back Pain, Adult Acute back pain is sudden and usually short-lived. It is often caused by an injury to the muscles and tissues in the back. The injury may result from: A muscle, tendon, or ligament getting overstretched or torn. Ligaments are tissues that connect bones to each other. Lifting something improperly can cause a back strain. Wear and tear (degeneration) of the spinal disks. Spinal disks are circular tissue that provide cushioning between the bones of the spine (vertebrae). Twisting motions, such as while playing sports or doing yard work. A hit to the back. Arthritis. You may have a physical exam, lab tests, and imaging tests to find the cause of your pain. Acute back pain usually goes away with rest and home care. Follow these instructions at home: Managing pain, stiffness, and swelling Take over-the-counter and prescription medicines only as told by your health care provider. Treatment may include medicines for pain and inflammation that are taken by mouth or applied to the skin, or muscle relaxants. Your health care provider may recommend applying ice during the first 24-48 hours after your pain starts. To do this: Put ice in a plastic bag. Place a towel between your skin and the bag. Leave the ice on for 20 minutes, 2-3 times a day. Remove the ice if your skin turns bright red. This is very important. If you cannot feel pain, heat, or cold, you have a greater risk of damage to the area. If directed, apply heat to the affected area as often as told by your health care provider. Use the heat source that your health care provider recommends, such as a moist heat pack or a heating pad. Place a towel between your skin and the heat source. Leave the heat on for 20-30 minutes. Remove the heat if your skin turns bright red. This is especially important if you are unable to feel pain, heat, or cold. You have a greater risk of getting burned. Activity  Do not stay in bed. Staying in  bed for more than 1-2 days can delay your recovery. Sit up and stand up straight. Avoid leaning forward when you sit or hunching over when you stand. If you work at a desk, sit close to it so you do not need to lean over. Keep your chin tucked in. Keep your neck drawn back, and keep your elbows bent at a 90-degree angle (right angle). Sit high and close to the steering wheel when you drive. Add lower back (lumbar) support to your car seat, if needed. Take short walks on even surfaces as soon as you are able. Try to increase the length of time you walk each day. Do not sit, drive, or stand in one place for more than 30 minutes at a time. Sitting or standing for long periods of time can put stress on your back. Do not drive or use heavy machinery while taking prescription pain medicine. Use proper lifting techniques. When you bend and lift, use positions that put less stress on your back: Bend your knees. Keep the load close to your body. Avoid twisting. Exercise regularly as told by your health care provider. Exercising helps your back heal faster and helps prevent back injuries by keeping muscles strong and flexible. Work with a physical therapist to make a safe exercise program, as recommended by your health care provider. Do any exercises as told by your physical therapist. Lifestyle Maintain a healthy weight. Extra weight puts stress on your back and makes it difficult to have good   posture. Avoid activities or situations that make you feel anxious or stressed. Stress and anxiety increase muscle tension and can make back pain worse. Learn ways to manage anxiety and stress, such as through exercise. General instructions Sleep on a firm mattress in a comfortable position. Try lying on your side with your knees slightly bent. If you lie on your back, put a pillow under your knees. Keep your head and neck in a straight line with your spine (neutral position) when using electronic equipment like  smartphones or pads. To do this: Raise your smartphone or pad to look at it instead of bending your head or neck to look down. Put the smartphone or pad at the level of your face while looking at the screen. Follow your treatment plan as told by your health care provider. This may include: Cognitive or behavioral therapy. Acupuncture or massage therapy. Meditation or yoga. Contact a health care provider if: You have pain that is not relieved with rest or medicine. You have increasing pain going down into your legs or buttocks. Your pain does not improve after 2 weeks. You have pain at night. You lose weight without trying. You have a fever or chills. You develop nausea or vomiting. You develop abdominal pain. Get help right away if: You develop new bowel or bladder control problems. You have unusual weakness or numbness in your arms or legs. You feel faint. These symptoms may represent a serious problem that is an emergency. Do not wait to see if the symptoms will go away. Get medical help right away. Call your local emergency services (911 in the [place] Do not drive yourself to the hospital. Summary Acute back pain is sudden and usually short-lived. Use proper lifting techniques. When you bend and lift, use positions that put less stress on your back. Take over-the-counter and prescription medicines only as told by your health care provider, and apply heat or ice as told. This information is not intended to replace advice given to you by your health care provider. Make sure you discuss any questions you have with your health care provider. Document Revised: 08/15/2020 Document Reviewed: 08/15/2020 Elsevier Patient Education  2024 [employer]  

## 2023-03-29 NOTE — Assessment & Plan Note (Signed)
-  MMR loss of [name]6, [name] with high mutation burden, acquired BRCA mutation (+)  --Initially presented with diarrhea 11/04/20, found to have diverticulitis with perforation and also developed an abscess requiring 2 drains, multiple hospital stays  --she started FOLFOX on 06/22/21 but did not respond -she switched to [facility] on 09/08/21. She has been tolerating well overall but has developed joint pain which could be related. This was overall manageable. She is clinically doing better also with more energy and less abdominal pain   -her CEA has normalized on immunotherapy. -CT CAP on 03/15/22 again showed mixed response -she had PET scan on 04/26/2022 for evaluation of her ovarian cysts, and saw GYN Dr. [name], we decided to monitor it. -She tolerating Keytruda well, with mild joint pain.  She recently started workout at the gym, which has helped her joint pain. -restaging CT from 08/04/2022 showed stable disease in left abdomen and pelvis, no new lesions.  -Will continue Keytruda  -repeated CT CAP scan on January 24, 2023 showed continuous response, no new lesions.  -she will be due for a new scan prior to her next visit 04/2024.

## 2023-03-29 NOTE — Assessment & Plan Note (Signed)
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 She was given samples of Salonpas lidocaine patches to apply to affected area as needed. I will also send rx Robaxin to use prn. If sx persist, will consider PT evaluation. She is in agreement with her treatment plan.

## 2023-03-29 NOTE — Assessment & Plan Note (Signed)
Most recent labs including electrolytes reviewed. She is encouraged to stay well hydrated. May benefit from Mg supplementation.

## 2023-03-29 NOTE — Assessment & Plan Note (Signed)
At her last measurement, she may have been 44DDD. She is encouraged to consider getting measured at [facility]. She does not wish to consider b/l breast reduction at this time.

## 2023-03-29 NOTE — Progress Notes (Addendum)
Patient Care Team: [name], MD as PCP - General (Internal Medicine) [name], MD as PCP - Cardiology (Cardiology) [name], [name] as Physician Assistant (Emergency Medicine) Malachy Mood, MD as Consulting Physician (Oncology) [name], MD as Consulting Physician (General Surgery) [name], [name], MD as Consulting Physician (Neurology) [name], [name], MD as Consulting Physician (Obstetrics and Gynecology) [name], MD as Consulting Physician (Gastroenterology) [name], [name], NP as Nurse Practitioner (Nurse Practitioner)  Clinic Day:  03/30/2023  Referring physician: Malachy Mood, MD  ASSESSMENT & PLAN:   Assessment & Plan: 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 She has been tolerating well overall but has developed joint pain which could be related. This was overall manageable. She is clinically doing better also with more energy and less abdominal pain   -her CEA has normalized on immunotherapy. -CT CAP on 03/15/22 again showed mixed response -she had PET scan on 04/26/2022 for evaluation of her ovarian cysts, and saw GYN Dr. [name], we decided to monitor it. -She tolerating Keytruda well, with mild joint pain.  She recently started workout at the gym, which has helped her joint pain. -restaging CT from 08/04/2022 showed stable disease in left abdomen and pelvis, no new lesions.  -Will continue Keytruda  -repeated CT CAP scan on January 24, 2023 showed continuous response, no new lesions.  -she will be due for a new scan prior to her next visit 04/2024.   Plan: Labs reviewed  -CBC showing WBC  5.7; Hgb 11.2; Hct 34.2; Plt 419; Anc 3.7 -CMP - K 3.4; glucose 122; BUN 12; Creatinine 0.84; eGFR >60; Ca 8.9; LFTs normal.   -TSH normal; T4 pending.   Proceed with Keytruda today - cycle 21 day 1 CT CAP prior to next visit Labs/flush, follow up, and infusion in 3 weeks as scheduled.   The patient understands the plans discussed today and is in agreement with them.  She knows to contact our office if she develops concerns prior to her next appointment.  I provided 25 minutes of face-to-face time during this encounter and > 50% was spent counseling as documented under my assessment and plan.    Carlean Jews, NP   [facility] AT [facility] [address] [place] [address] Dept: [phone] Dept Fax: [phone]   Orders Placed This Encounter  Procedures   CT CHEST ABDOMEN PELVIS W CONTRAST    Standing Status:   Future    Standing Expiration Date:   03/29/2024    Scheduling Instructions:     Please schedule prior to next follow up and treatment on 04/20/2023. Thank you.  Zora Glendenning NP    Order Specific Question:   If indicated for the ordered procedure, I authorize the administration of contrast media per Radiology protocol    Answer:   Yes    Order Specific Question:   Does the patient have a contrast media/X-ray dye allergy?    Answer:   No    Order Specific Question:   Is patient pregnant?    Answer:   No    Order Specific Question:   Preferred imaging location?    Answer:   [facility]    Order Specific Question:   If indicated for the ordered procedure, I authorize the administration of oral contrast media per Radiology protocol    Answer:   Yes      CHIEF COMPLAINT:  CC: cancer of sigmoid colon   Current Treatment:  pembrolizumab every 21 days.  INTERVAL HISTORY:  Staley is here today for repeat clinical assessment. She denies fevers or chills. Her appetite is good. Her weight has increased 4  pounds over last week . She has had recent complaint of back pain. Today, she rates this pain as 7/10 in severity. An MRI of her lumbar spine was performed in 02/2021. 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 No aggressive bone lesions. Minimal degenerative changes of the lumbar spine without high-grade spinal canal or neural foraminal stenosis at any level. Most recent CT CAP was done 01/2023 and showed a hemangioma in the L2 vertebral body. Mild degenerative subcortical cyst formation in the left acetabulum. Findings are likely contributing factor to  patient's back pain. She has pain medication which was previously prescribed, which she can take  as needed. Primary care provider prescribed new muscle relaxer, yesterday, to see if this would improve muscle spasms. Physical therapy was also recommended by primary care provider. Will repeat CT CAP prior to her next visit and review.   I have reviewed the past medical history, past surgical history, social history and family history with the patient and they are unchanged from previous note.  ALLERGIES:  is allergic to chlorhexidine gluconate, shellfish allergy, and penicillins.  MEDICATIONS:  Current Outpatient Medications  Medication Sig Dispense Refill   ALPRAZolam (XANAX) 0.25 MG tablet Take 1 tablet (0.25 mg total) by mouth at bedtime as needed for anxiety. 30 tablet 0   amLODipine (NORVASC) 10 MG tablet TAKE 1 TABLET BY MOUTH EVERY DAY 90 tablet 3   furosemide (LASIX) 20 MG tablet TAKE 1 TABLET BY MOUTH EVERY DAY AS NEEDED 90 tablet 1   KLOR-CON M20 20 MEQ tablet TAKE 1 TABLET BY MOUTH EVERY DAY 90 tablet 1   lidocaine-prilocaine (EMLA) cream Apply 1 Application topically as needed. 30 g 3   methocarbamol (ROBAXIN-750) 750 MG tablet Take 1 tablet (750 mg total) by mouth every 8 (eight) hours as needed for muscle spasms. 30 tablet 0   Metoprolol Tartrate 75 MG TABS Take 1 tablet (75 mg total) by mouth 2 (two) times daily. (Patient  taking differently: Take 75 mg by mouth 2 (two) times daily. Takes 1/2 tablet by mouth 2x daily.) 90 tablet 3   POTASSIUM PO Take by mouth.     pregabalin (LYRICA) 75 MG capsule Take 1 capsule (75 mg total) by mouth at bedtime. X 1 week, then 75 mg BID x 1 week, then can increase to 75 in AM and 150 mg QHS- nerve pain 90 capsule 5   prochlorperazine (COMPAZINE) 10 MG tablet Take 1 tablet (10 mg total) by mouth every 8 (eight) hours as needed for nausea or vomiting. 30 tablet 0   sulfamethoxazole-trimethoprim (BACTRIM DS) 800-160 MG tablet      traZODone (DESYREL) 50 MG tablet TAKE 1-2 TABLETS BY MOUTH AT BEDTIME AS NEEDED FOR SLEEP. 180 tablet 1   No current facility-administered medications for this visit.   Facility-Administered Medications Ordered in Other Visits  Medication Dose Route Frequency Provider Last Rate Last Admin   fentaNYL (SUBLIMAZE) injection   Intravenous PRN Mir, Al Corpus, MD   50 mcg at  10/02/21 1436   midazolam (VERSED) injection   Intravenous PRN Mir, Al Corpus, MD   1 mg at 10/02/21 1436    HISTORY OF PRESENT ILLNESS:   Oncology History Overview Note   Cancer Staging  Malignant neoplasm of sigmoid colon [facility]) Staging form: Colon and Rectum, AJCC 8th Edition - Pathologic stage from 04/15/2021: Stage IIC (pT4b, pN0, cM0) - Signed by Malachy Mood, MD on 06/12/2021    Malignant neoplasm of sigmoid colon [facility])   Initial Diagnosis   Malignant neoplasm of sigmoid colon (HCC)   11/04/2020 Imaging   EXAM: CT ABDOMEN AND PELVIS WITHOUT CONTRAST  IMPRESSION: 1. Perforating descending colonic diverticulitis with multiple abdominopelvic gas and fluid collections, measuring up to 5.7 cm and further described above. 2. Cholelithiasis without findings of acute cholecystitis. 3. 3.6 cm benign left adrenal adenoma. 4. Leiomyomatous uterus. 5. Asymmetric sclerosis of the iliac portion of the bilateral SI joints, as can be seen with benign self-limiting osteitis  condensans iliac.   11/07/2020 Imaging   EXAM: CT ABDOMEN AND PELVIS WITHOUT CONTRAST  IMPRESSION: Continued wall thickening is seen involving descending colon suggesting infectious or inflammatory colitis or perforated diverticulitis. There is an adjacent fluid collection measuring 7.0 x 5.0 cm consistent with abscess which is significantly enlarged compared to prior exam. Adjacent to the abscess, there is a severely thickened small bowel loop most consistent with secondary inflammation.   5.2 x 3.5 cm fluid collection is noted within the left psoas muscle consistent with abscess which is significantly enlarged compared to prior exam. 4.4 x 3.8 cm fluid collection consistent with abscess is noted in the left retroperitoneal region which is also enlarged compared to prior exam.   Fibroid uterus.   Cholelithiasis.   11/27/2020 Imaging   EXAM: CT ABDOMEN AND PELVIS WITHOUT CONTRAST  IMPRESSION: 1. Significantly interval decreased size of the previously visualized left retroperitoneal and left anterior mid abdominal fluid collections. There is persistent fat stranding and mural thickening of the mid descending colon at this level. 2. Unchanged leiomyomatous uterus. 3. Unchanged cholelithiasis.   12/11/2020 Imaging   EXAM: CT ABDOMEN AND PELVIS WITHOUT CONTRAST  IMPRESSION: 1. Stable inflammatory changes centered around the distal descending colon in the left lower abdomen. Pericolonic inflammatory changes have minimally changed since 11/27/2020. Small pocket of gas medial to the colon is probably associated with a fistula or small residual abscess collection. 2. Stable position of the two percutaneous drains. The more anterior drain may be extending through a portion of the small bowel. 3. Cholelithiasis. 4. Fibroid uterus. Cannot exclude an ovarian/adnexal cystic structure near the uterine fundus. 5. Left adrenal adenoma.   01/07/2021 Imaging   EXAM: CT ABDOMEN AND PELVIS WITH  CONTRAST  IMPRESSION: 1. No new abscesses identified. Similar degree of soft tissue thickening seen in the left pericolonic region. The degree of persistent soft tissues thickening in the pericolonic space further raises suspicions for malignancy. Further evaluation with colonoscopy should be performed. 2. Left retroperitoneal abscess drain unchanged in position. Anterior left abdominal drain again seen terminating within small bowel loop. 3. 2.6 cm mildly sclerotic lesion noted in the L2 vertebral body. Further evaluation with contrast enhanced lumbar spine MRI should be performed.   02/04/2021 Imaging   EXAM: CT ABDOMEN AND PELVIS WITH CONTRAST  IMPRESSION: No new abdominopelvic collections or abscess development in the 1 month interval.   Left anterior drain remains within a loop of small bowel, unchanged.   Left lateral abscess drain remains in the retroperitoneal space adjacent  to the iliopsoas muscle with a small amount of residual fluid and air but no measurable collection.   Stable soft tissue prominence and pericolonic strandy edema/inflammation about the left descending colon compatible with residual diverticulitis/colitis. Difficult to exclude underlying transmural lesion.   04/01/2021 Imaging   EXAM: CT ABDOMEN AND PELVIS WITH CONTRAST  IMPRESSION: There appears to be significant enhancement and wall thickening involving the descending colon with some degree of traction and involvement of adjacent small bowel loops. This is consistent with the history of diverticulitis and perforation. Stable position of percutaneous drainage catheter is seen adjacent to left psoas muscle with no significant residual fluid remaining. The other percutaneous drainage catheter that was previously noted to be within small bowel loop on prior exam in the left lower quadrant, has significantly retracted and appears to be outside of the peritoneal space at this time.   Since the prior  exam, there does appear to be some degree of rotation involving mesenteric vessels and structures in the right lower quadrant, suggesting partial volvulus or malrotation. There is the interval development of several lymph nodes in this area, most likely inflammatory or reactive in etiology. Mild amount of free fluid is also noted in the pelvis. However, no significant bowel wall thickening or dilatation is seen in this area. These results will be called to the ordering clinician or representative by the Radiologist Assistant, and communication documented in the PACS or zVision Dashboard.   Stable uterine fibroid.   Hepatic steatosis.   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   Cholelithiasis.   04/10/2021 Imaging   EXAM: CT ANGIOGRAPHY CHEST CT ABDOMEN AND PELVIS WITH CONTRAST  IMPRESSION: 1. Again seen are findings compatible with descending colon diverticulitis with perforation. Free air and inflammation have increased in the interval. 2. New lobulated enhancing fluid collection posterior to this segment of inflamed colon measuring 8.5 x 3.5 x 10.0 cm. This collection now invades the adjacent iliopsoas muscle as well as extends through the left lateral abdominal wall. The tip of the drainage catheter is in this collection. Findings are compatible with abscess. 3. Anterior left percutaneous drainage catheter tip has been pulled back and is now within the subcutaneous tissues. 4. Trace free fluid. 5. No pulmonary embolism.  No acute cardiopulmonary process. 6. Cholelithiasis. 7. Fatty infiltration of the liver.   04/15/2021 Definitive Surgery   FINAL MICROSCOPIC DIAGNOSIS:   A. SMALL BOWEL, RESECTION:  - Adenocarcinoma.  - No carcinoma identified in 1 lymph node.   B. PERFORATED LEFT COLON, RESECTION:  - Moderately differentiated colonic adenocarcinoma.  - Tumor extends into pericolonic adipose tissue, and is strongly  suggestive of invasion into small bowel.  See oncology  table/comments.  - No carcinoma identified in 4 lymph nodes (0/4).  - Tubular adenoma with high-grade dysplasia, 1.  - Tubular adenomas with low grade dysplasia, 3.   Comments: The size of the tumor is difficult to estimate secondary to the disrupted nature of the specimen, as well as the infiltrative nature of the tumor.  Tumor can be identified as definitely invading into the pericolonic adipose tissue (block B4), but given the extreme disruption of the tissue, and the involvement of the small bowel by what appears to be a colonic adenocarcinoma, I favor perforation of the large bowel with direct invasion into the small bowel.  Accordingly, I believe this is best regarded as a pT4b lesion.   ADDENDUM:  Mismatch Repair Protein (IHC)  SUMMARY INTERPRETATION: ABNORMAL  There is loss of the  major MMR protein [name]6: This indicates a high probability that a hereditary germline mutation is present and referral to genetic counseling is warranted. It is recommended that the loss of protein expression be correlated with molecular based microsatellite instability testing.   IHC EXPRESSION RESULTS  TEST           RESULT  MLH1:          Preserved nuclear expression  [name]2:          Preserved nuclear expression  [name]6:          LOSS OF NUCLEAR EXPRESSION  PMS2:          Preserved nuclear expression    04/15/2021 Cancer Staging   Staging form: Colon and Rectum, AJCC 8th Edition - Pathologic stage from 04/15/2021: Stage IIC (pT4b, pN0, cM0) - Signed by Malachy Mood, MD on 06/12/2021 Stage prefix: Initial diagnosis Total positive nodes: 0 Histologic grading system: 4 grade system Histologic grade (G): G2 Residual tumor (R): R0 - None   04/29/2021 Imaging   EXAM: CT ABDOMEN AND PELVIS WITH CONTRAST  IMPRESSION: Continued left retroperitoneal abscesses inferior to the left kidney, involving the left psoas muscle and left abdominal wall musculature. Overall size is decreased since prior study.  Interval removal of left lower quadrant abscess drainage catheter.   New fluid collection in the cul-de-sac and wrapping around the uterus concerning for abscess.   Cholelithiasis.   Small left pleural effusion.  Bibasilar atelectasis.   Stable left adrenal adenoma.  ADDENDUM: After discussing the case with Dr. Bryn Gulling in interventional radiology, it was noted that the fluid in the pelvis originally thought to be in the cul-de-sac is likely within the vagina, best seen on sagittal imaging. Recommend speculum exam.   Also, in the left lateral wall in the area of prior abscess in the lateral wall abdominal musculature, some of the abdomen soft tissue appears to be enhancing. While this could be infectious, cannot exclude tumor seeding in the left lateral abdominal wall, measuring 6.9 x 3.8 cm on image 64 of series 2.   06/16/2021 Imaging   EXAM: CT ABDOMEN AND PELVIS WITH CONTRAST  IMPRESSION: Increased size of bulky soft tissue masses involving the left psoas muscle and left lateral abdominal wall soft tissues, consistent with progressive metastatic disease.   Significant progression of multiple peritoneal masses throughout the abdomen and pelvis, consistent with peritoneal carcinomatosis.   New mild retroperitoneal lymphadenopathy, consistent with metastatic disease.   Stable uterine fibroid and left adrenal adenoma.   Cholelithiasis, without evidence of cholecystitis.   06/22/2021 - 08/19/2021 Chemotherapy   Patient is on Treatment Plan : COLORECTAL FOLFOX q14d x 6 months     06/22/2021 Tumor Marker   Patient's tumor was tested for the following markers: CEA. Results of the tumor marker test revealed 87.41.   07/10/2021 Pathology Results   FINAL MICROSCOPIC DIAGNOSIS:   A. PERITONEAL MASS, LEFT UPPER ABDOMINAL QUADRANT, BIOPSY:  -  Metastatic adenocarcinoma with necrosis, histologically consistent with colon primary.     Genetic Testing   Pathogenic variant in APC called  c.1312+3A>G identified on the [employer] CancerNext-Expanded+RNA panel. The report date is 07/16/2021. The remainder of testing was negative/normal.  The CancerNext-Expanded + RNAinsight gene panel offered by [employer] and includes sequencing and rearrangement analysis for the following 77 genes: IP, ALK, APC*, ATM*, AXIN2, BAP1, BARD1, BLM, BMPR1A, BRCA1*, BRCA2*, BRIP1*, CDC73, CDH1*,CDK4, CDKN1B, CDKN2A, CHEK2*, CTNNA1, DICER1, FANCC, FH, FLCN, GALNT12, KIF1B, LZTR1, MAX, MEN1, MET, MLH1*, [name]2*, [name]3,  [name]6*, MUTYH*, NBN, NF1*, NF2, NTHL1, PALB2*, PHOX2B, PMS2*, POT1, PRKAR1A, PTCH1, PTEN*, RAD51C*, RAD51D*,RB1, RECQL, RET, SDHA, SDHAF2, SDHB, SDHC, SDHD, SMAD4, SMARCA4, SMARCB1, SMARCE1, STK11, SUFU, TMEM127, TP53*,TSC1, TSC2, VHL and XRCC2 (sequencing and deletion/duplication); EGFR, EGLN1, HOXB13, KIT, MITF, PDGFRA, POLD1 and POLE (sequencing only); EPCAM and GREM1 (deletion/duplication only).   08/28/2021 Imaging   EXAM: CT CHEST, ABDOMEN, AND PELVIS WITH CONTRAST  IMPRESSION: 1. Continued interval progression of multiple metastatic soft tissue nodules/masses in the abdomen and pelvis. 2. No evidence for metastatic disease in the chest. 3. Similar appearance of the subtle lesion in the L2 vertebral body, suspicious for metastatic involvement. 4. Cholelithiasis.   09/03/2021 -  Chemotherapy   Patient is on Treatment Plan : COLORECTAL Pembrolizumab (200) q21d     09/08/2021 - 02/01/2022 Chemotherapy   Patient is on Treatment Plan : COLORECTAL Pembrolizumab (200) q21d     01/08/2022 Imaging   CT CAP IMPRESSION: 1. Mixed response to therapy. Interval decrease in size of the conglomerate multilobulated mass extending from the left psoas into the left posterior pararenal space through the left lateral abdominal wall along the tract of the previously placed percutaneous drainage catheter. Interval decrease in size of right adnexal mass and omental masses within the right lower quadrant of the abdomen.  Interval development of peritoneal carcinomatosis with omental caking within the left lower quadrant of the abdomen and left subdiaphragmatic region adjacent to the spleen and along the left pericolic gutter as well as development of ascites appearing loculated within the anterior peritoneum. 2. Interval development of mild left hydronephrosis secondary to stricturing of the mid left ureter. 3. Stable left adrenal metastasis. 4. Stable sclerotic lesion within the L2 vertebral body. No new lytic or blastic bone lesions are seen. 5. Cholelithiasis. 6. No evidence of intrathoracic metastatic disease. 7. 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 Moderate colonic stool burden. No evidence of obstruction.   03/15/2022 Imaging   IMPRESSION: 1. Status post sigmoid colon resection with left lower quadrant end colostomy. 2. Multicystic bilateral ovarian lesions are slightly increased in size. 3. Diminished, small volume of loculated appearing ascites throughout the abdomen and pelvis, with similar appearance of peritoneal thickening and nodularity throughout. 4. Heterogeneously calcified, conglomerate masses in the left retroperitoneum and abdominal wall are slightly diminished in size. 5. Left adrenal lesion not significantly changed. 6. Unchanged faintly sclerotic metastatic lesion of L2. 7. Constellation of findings is consistent with mixed response to treatment however with overall little interval change. 8. Minimal residual left hydronephrosis and proximal hydroureter, improved compared to prior examination. 9. Cholelithiasis. 10. Coronary artery disease, significantly advanced for patient age.   08/04/2022 Imaging    IMPRESSION: CT CHEST:   1. No developing mass lesion, fluid collection or lymph node enlargement in the thorax. Stable tiny 3 mm lung nodule. 2. Coronary artery calcifications. Please correlate for  other coronary risk factors. 3. Chest port   CT ABDOMEN AND PELVIS:   1. Extensive surgical changes identified. Left lower quadrant ostomy, diverting colostomy. Surgical changes along loops of small bowel in the right hemiabdomen. 2. Once again there are areas of nodular tissue extending along the left lateral abdominal wall, into the retroperitoneum which are similar to previous. The amount of adjacent fluid in these locations has improved. 3. No new mass lesion, fluid collection or lymph node enlargement in the abdomen or pelvis. 4. Gallstones. 5. Enlarged uterus with fibroids. Bilateral complex cystic areas are once again  seen and based on appearance differential includes dilated fallopian tubes, hydrosalpinx versus true cystic lesions. Please correlate for any known history or prior workup 6. 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 No developing mass lesion, fluid collection or lymph node enlargement in the thorax. Stable tiny 3 mm lung nodule. 2. Coronary artery calcifications. Please correlate for other coronary risk factors. 3. Chest port   CT ABDOMEN AND PELVIS:   1. Extensive surgical changes identified. Left lower quadrant ostomy, diverting colostomy. Surgical changes along loops of small bowel in the right hemiabdomen. 2. Once again there are areas of nodular tissue extending along the left lateral abdominal wall, into the retroperitoneum which are similar to previous. The amount of adjacent fluid in these locations has improved. 3. No new mass lesion, fluid collection or lymph node enlargement in the abdomen or pelvis. 4. Gallstones. 5.  Enlarged uterus with fibroids. Bilateral complex cystic areas are once again seen and based on appearance differential includes dilated fallopian tubes, hydrosalpinx versus true cystic lesions. Please correlate for any known history or prior workup 6. Limited evaluation for solid organ pathology metastatic disease without the advantage of IV contrast         REVIEW OF SYSTEMS:   Constitutional: Denies fevers, chills or abnormal weight loss Eyes: Denies blurriness of vision Ears, nose, mouth, throat, and face: Denies mucositis or sore throat Respiratory: Denies cough, dyspnea or wheezes Cardiovascular: Denies palpitation, chest discomfort or lower extremity swelling Gastrointestinal:  Denies nausea, heartburn or change in bowel habits Skin: Denies abnormal skin rashes Lymphatics: Denies new lymphadenopathy or easy bruising Neurological:Denies numbness, tingling or new weaknesses Behavioral/Psych: Mood is stable, no new changes  Musculoskeletal: moderate back pain. Rated as 7/10 in severity.  All other systems were reviewed with the patient and are negative.   VITALS:   Today's Vitals   03/30/23 1156 03/30/23 1203  BP: (!) 148/97 132/72  Pulse: 82   Resp: 18   Temp: 98.2 F (36.8 C)   TempSrc: Oral   SpO2: 100%   Weight: (!) 311 lb 9.6 oz (141.3 kg)   Height: 5\' 3"  (1.6 m)    Body mass index is 55.2 kg/m.   Wt Readings from Last 3 Encounters:  03/30/23 (!) 311 lb 9.6 oz (141.3 kg)  03/29/23 (!) 304 lb (137.9 kg)  03/09/23 (!) 307 lb 12.8 oz (139.6 kg)    Body mass index is 55.2 kg/m.  Performance status (ECOG): 1 - Symptomatic but completely ambulatory  PHYSICAL EXAM:   GENERAL:alert, no distress and comfortable SKIN: skin color, texture, turgor are normal, no rashes or significant lesions EYES: normal, Conjunctiva are pink and non-injected, sclera clear OROPHARYNX:no exudate, no erythema and lips, buccal mucosa, and tongue normal  NECK: supple, thyroid  normal size, non-tender, without nodularity LYMPH:  no palpable lymphadenopathy in the cervical, axillary or inguinal LUNGS: clear to auscultation and percussion with normal breathing effort HEART: regular rate & rhythm and no murmurs and no lower extremity edema ABDOMEN:abdomen soft, non-tender and normal bowel sounds Musculoskeletal:no cyanosis of digits and no clubbing. No bony abnormality or deformity palpable n the back.  NEURO: alert & oriented  x 3 with fluent speech, no focal motor/sensory deficits  LABORATORY DATA:  I have reviewed the data as listed    Component Value Date/Time   NA 139 03/30/2023 1107   NA 139 12/21/2019 1024   K 3.4 (L) 03/30/2023 1107   CL 108 03/30/2023 1107   CO2 25 03/30/2023 1107   GLUCOSE 122 (H) 03/30/2023 1107   BUN 12 03/30/2023 1107   BUN 13 12/21/2019 1024   CREATININE 0.84 03/30/2023 1107   CALCIUM 8.9 03/30/2023 1107   PROT 7.2 03/30/2023 1107   PROT 7.4 07/11/2019 1638   ALBUMIN 3.9 03/30/2023 1107   ALBUMIN 4.6 07/11/2019 1638   AST 16 03/30/2023 1107   ALT 16 03/30/2023 1107   ALKPHOS 51 03/30/2023 1107   BILITOT 0.3 03/30/2023 1107   GFRNONAA >60 03/30/2023 1107   GFRAA 96 12/21/2019 1024     Lab Results  Component Value Date   WBC 5.7 03/30/2023   NEUTROABS 3.7 03/30/2023   HGB 11.2 (L) 03/30/2023   HCT 34.2 (L) 03/30/2023   MCV 83.8 03/30/2023   PLT 419 (H) 03/30/2023    Addendum I have seen the patient, examined her. I agree with the assessment and and plan and have edited the notes.   Shavonna is overall doing well, but it developed worsening back spasm (she had before) and persistent midthoracic back pain.  She is due for restaging CT scan, will order CT chest, abdomen and pelvis with contrast to be done in next few weeks.  Lab reviewed, will proceed with Keytruda today.  Will see her back in 3 weeks to review her scan, questions were answered.  I spent a total of 25 minutes for her visit today, more than 50% time on  face-to-face counseling.  Malachy Mood MD 03/30/2023

## 2023-03-30 ENCOUNTER — Encounter: Payer: Self-pay | Admitting: Nurse Practitioner

## 2023-03-30 ENCOUNTER — Other Ambulatory Visit: Payer: Self-pay

## 2023-03-30 ENCOUNTER — Encounter: Payer: Self-pay | Admitting: Hematology

## 2023-03-30 ENCOUNTER — Inpatient Hospital Stay: Payer: Managed Care, Other (non HMO) | Admitting: Nurse Practitioner

## 2023-03-30 ENCOUNTER — Inpatient Hospital Stay: Payer: Managed Care, Other (non HMO)

## 2023-03-30 VITALS — BP 132/72 | HR 82 | Temp 98.2°F | Resp 18 | Ht 63.0 in | Wt 311.6 lb

## 2023-03-30 DIAGNOSIS — C187 Malignant neoplasm of sigmoid colon: Secondary | ICD-10-CM

## 2023-03-30 DIAGNOSIS — C786 Secondary malignant neoplasm of retroperitoneum and peritoneum: Secondary | ICD-10-CM

## 2023-03-30 DIAGNOSIS — Z452 Encounter for adjustment and management of vascular access device: Secondary | ICD-10-CM

## 2023-03-30 LAB — CBC WITH DIFFERENTIAL (CANCER CENTER ONLY)
Abs Immature Granulocytes: 0.03 10*3/uL (ref 0.00–0.07)
Basophils Absolute: 0 10*3/uL (ref 0.0–0.1)
Basophils Relative: 1 %
Eosinophils Absolute: 0.1 10*3/uL (ref 0.0–0.5)
Eosinophils Relative: 2 %
HCT: 34.2 % — ABNORMAL LOW (ref 36.0–46.0)
Hemoglobin: 11.2 g/dL — ABNORMAL LOW (ref 12.0–15.0)
Immature Granulocytes: 1 %
Lymphocytes Relative: 27 %
Lymphs Abs: 1.6 10*3/uL (ref 0.7–4.0)
MCH: 27.5 pg (ref 26.0–34.0)
MCHC: 32.7 g/dL (ref 30.0–36.0)
MCV: 83.8 fL (ref 80.0–100.0)
Monocytes Absolute: 0.3 10*3/uL (ref 0.1–1.0)
Monocytes Relative: 5 %
Neutro Abs: 3.7 10*3/uL (ref 1.7–7.7)
Neutrophils Relative %: 64 %
Platelet Count: 419 10*3/uL — ABNORMAL HIGH (ref 150–400)
RBC: 4.08 MIL/uL (ref 3.87–5.11)
RDW: 14.3 % (ref 11.5–15.5)
WBC Count: 5.7 10*3/uL (ref 4.0–10.5)
nRBC: 0 % (ref 0.0–0.2)

## 2023-03-30 LAB — CMP (CANCER CENTER ONLY)
ALT: 16 U/L (ref 0–44)
AST: 16 U/L (ref 15–41)
Albumin: 3.9 g/dL (ref 3.5–5.0)
Alkaline Phosphatase: 51 U/L (ref 38–126)
Anion gap: 6 (ref 5–15)
BUN: 12 mg/dL (ref 6–20)
CO2: 25 mmol/L (ref 22–32)
Calcium: 8.9 mg/dL (ref 8.9–10.3)
Chloride: 108 mmol/L (ref 98–111)
Creatinine: 0.84 mg/dL (ref 0.44–1.00)
GFR, Estimated: >60 mL/min (ref >60)
Glucose, Bld: 122 mg/dL — ABNORMAL HIGH (ref 70–99)
Potassium: 3.4 mmol/L — ABNORMAL LOW (ref 3.5–5.1)
Sodium: 139 mmol/L (ref 135–145)
Total Bilirubin: 0.3 mg/dL (ref 0.3–1.2)
Total Protein: 7.2 g/dL (ref 6.5–8.1)

## 2023-03-30 LAB — PREGNANCY, URINE: Preg Test, Ur: NEGATIVE

## 2023-03-30 LAB — TSH: TSH: 1.119 u[IU]/mL (ref 0.350–4.500)

## 2023-03-30 MED ORDER — PROCHLORPERAZINE MALEATE 10 MG PO TABS: 10.0000 mg | ORAL_TABLET | Freq: Three times a day (TID) | ORAL | 0 refills | Status: DC | PRN

## 2023-03-30 MED ORDER — SODIUM CHLORIDE 0.9 % IV SOLN: Freq: Once | INTRAVENOUS | Status: AC

## 2023-03-30 MED ORDER — SODIUM CHLORIDE 0.9 % IV SOLN
200.0000 mg | Freq: Once | INTRAVENOUS | Status: AC
  Administered 2023-03-30: 200 mg via INTRAVENOUS
  Filled 2023-03-30: qty 200

## 2023-03-30 MED ORDER — SODIUM CHLORIDE 0.9% FLUSH
10.0000 mL | Freq: Once | INTRAVENOUS | Status: AC
Start: 2023-03-30 — End: 2023-03-30
  Administered 2023-03-30: 10 mL

## 2023-03-30 MED ORDER — HEPARIN SOD (PORK) LOCK FLUSH 100 UNIT/ML IV SOLN
500.0000 [IU] | Freq: Once | INTRAVENOUS | Status: AC | PRN
  Administered 2023-03-30: 500 [IU]

## 2023-03-30 MED ORDER — SODIUM CHLORIDE 0.9% FLUSH
10.0000 mL | INTRAVENOUS | Status: DC | PRN
  Administered 2023-03-30: 10 mL

## 2023-03-30 NOTE — Patient Instructions (Signed)
[facility] Rios  Discharge Instructions: Thank you for choosing [facility] to provide your oncology and hematology care.   If you have a lab appointment with the [facility], please go directly to the [facility] and check in at the registration area.   Wear comfortable clothing and clothing appropriate for easy access to any Portacath or PICC line.   We strive to give you quality time with your provider. You may need to reschedule your appointment if you arrive late (15 or more minutes).  Arriving late affects you and other patients whose appointments are after yours.  Also, if you miss three or more appointments without notifying the office, you may be dismissed from the clinic at the provider's discretion.      For prescription refill requests, have your pharmacy contact our office and allow 72 hours for refills to be completed.    Today you received the following chemotherapy and/or immunotherapy agents: Keytruda      To help prevent nausea and vomiting after your treatment, we encourage you to take your nausea medication as directed.  BELOW ARE SYMPTOMS THAT SHOULD BE REPORTED IMMEDIATELY: *FEVER GREATER THAN 100.4 F (38 C) OR HIGHER *CHILLS OR SWEATING *NAUSEA AND VOMITING THAT IS NOT CONTROLLED WITH YOUR NAUSEA MEDICATION *UNUSUAL SHORTNESS OF BREATH *UNUSUAL BRUISING OR BLEEDING *URINARY PROBLEMS (pain or burning when urinating, or frequent urination) *BOWEL PROBLEMS (unusual diarrhea, constipation, pain near the anus) TENDERNESS IN MOUTH AND THROAT WITH OR WITHOUT PRESENCE OF ULCERS (sore throat, sores in mouth, or a toothache) UNUSUAL RASH, SWELLING OR PAIN  UNUSUAL VAGINAL DISCHARGE OR ITCHING   Items with * indicate a potential emergency and should be followed up as soon as possible or go to the Emergency Department if any problems should occur.  Please show the CHEMOTHERAPY ALERT CARD or IMMUNOTHERAPY ALERT CARD at  check-in to the Emergency Department and triage nurse.  Should you have questions after your visit or need to cancel or reschedule your appointment, please contact [facility]  Dept: [phone]  and follow the prompts.  Office hours are 8:00 a.m. to 4:30 p.m. Monday - Friday. Please note that voicemails left after 4:00 p.m. may not be returned until the following business day.  We are closed weekends and major holidays. You have access to a nurse at all times for urgent questions. Please call the main number to the clinic Dept: [phone] and follow the prompts.   For any non-urgent questions, you may also contact your provider using MyChart. We now offer e-Visits for anyone 14 and older to request care online for non-urgent symptoms. For details visit [email or web address].   Also download the MyChart app! Go to the app store, search "MyChart", open the app, select [facility], and log in with your MyChart username and password.

## 2023-03-31 LAB — T4: T4, Total: 7 ug/dL (ref 4.5–12.0)

## 2023-04-07 ENCOUNTER — Encounter: Payer: Self-pay | Admitting: Nurse Practitioner

## 2023-04-13 ENCOUNTER — Ambulatory Visit (HOSPITAL_COMMUNITY)
Admission: RE | Admit: 2023-04-13 | Discharge: 2023-04-13 | Disposition: A | Discharge status: Home / Self Care | Payer: Managed Care, Other (non HMO) | Source: Ambulatory Visit | Attending: Nurse Practitioner | Admitting: Nurse Practitioner

## 2023-04-13 DIAGNOSIS — C187 Malignant neoplasm of sigmoid colon: Secondary | ICD-10-CM | POA: Diagnosis present

## 2023-04-13 MED ORDER — IOHEXOL 300 MG/ML  SOLN
100.0000 mL | Freq: Once | INTRAMUSCULAR | Status: AC | PRN
  Administered 2023-04-13: 100 mL via INTRAVENOUS

## 2023-04-20 ENCOUNTER — Inpatient Hospital Stay (HOSPITAL_BASED_OUTPATIENT_CLINIC_OR_DEPARTMENT_OTHER): Payer: Managed Care, Other (non HMO) | Admitting: Hematology

## 2023-04-20 ENCOUNTER — Inpatient Hospital Stay: Payer: Managed Care, Other (non HMO) | Attending: Hematology

## 2023-04-20 ENCOUNTER — Inpatient Hospital Stay: Payer: Managed Care, Other (non HMO)

## 2023-04-20 VITALS — BP 168/78 | HR 88 | Temp 98.2°F | Resp 18

## 2023-04-20 VITALS — BP 196/114 | HR 111 | Temp 98.5°F | Resp 18 | Ht 63.0 in | Wt 319.2 lb

## 2023-04-20 DIAGNOSIS — N83209 Unspecified ovarian cyst, unspecified side: Secondary | ICD-10-CM | POA: Diagnosis not present

## 2023-04-20 DIAGNOSIS — Z5112 Encounter for antineoplastic immunotherapy: Secondary | ICD-10-CM | POA: Insufficient documentation

## 2023-04-20 DIAGNOSIS — J90 Pleural effusion, not elsewhere classified: Secondary | ICD-10-CM | POA: Insufficient documentation

## 2023-04-20 DIAGNOSIS — G4730 Sleep apnea, unspecified: Secondary | ICD-10-CM | POA: Insufficient documentation

## 2023-04-20 DIAGNOSIS — C786 Secondary malignant neoplasm of retroperitoneum and peritoneum: Secondary | ICD-10-CM

## 2023-04-20 DIAGNOSIS — D259 Leiomyoma of uterus, unspecified: Secondary | ICD-10-CM | POA: Insufficient documentation

## 2023-04-20 DIAGNOSIS — K8020 Calculus of gallbladder without cholecystitis without obstruction: Secondary | ICD-10-CM | POA: Insufficient documentation

## 2023-04-20 DIAGNOSIS — Z79899 Other long term (current) drug therapy: Secondary | ICD-10-CM | POA: Diagnosis not present

## 2023-04-20 DIAGNOSIS — R911 Solitary pulmonary nodule: Secondary | ICD-10-CM | POA: Diagnosis not present

## 2023-04-20 DIAGNOSIS — K760 Fatty (change of) liver, not elsewhere classified: Secondary | ICD-10-CM | POA: Diagnosis not present

## 2023-04-20 DIAGNOSIS — C187 Malignant neoplasm of sigmoid colon: Secondary | ICD-10-CM

## 2023-04-20 DIAGNOSIS — Z452 Encounter for adjustment and management of vascular access device: Secondary | ICD-10-CM

## 2023-04-20 DIAGNOSIS — I10 Essential (primary) hypertension: Secondary | ICD-10-CM | POA: Insufficient documentation

## 2023-04-20 DIAGNOSIS — R635 Abnormal weight gain: Secondary | ICD-10-CM | POA: Insufficient documentation

## 2023-04-20 DIAGNOSIS — D3502 Benign neoplasm of left adrenal gland: Secondary | ICD-10-CM | POA: Diagnosis not present

## 2023-04-20 DIAGNOSIS — C7951 Secondary malignant neoplasm of bone: Secondary | ICD-10-CM | POA: Insufficient documentation

## 2023-04-20 DIAGNOSIS — Z1501 Genetic susceptibility to malignant neoplasm of breast: Secondary | ICD-10-CM | POA: Insufficient documentation

## 2023-04-20 DIAGNOSIS — E876 Hypokalemia: Secondary | ICD-10-CM | POA: Insufficient documentation

## 2023-04-20 DIAGNOSIS — I2510 Atherosclerotic heart disease of native coronary artery without angina pectoris: Secondary | ICD-10-CM | POA: Diagnosis not present

## 2023-04-20 DIAGNOSIS — M2550 Pain in unspecified joint: Secondary | ICD-10-CM | POA: Diagnosis not present

## 2023-04-20 LAB — CMP (CANCER CENTER ONLY)
ALT: 10 U/L (ref 0–44)
AST: 14 U/L — ABNORMAL LOW (ref 15–41)
Albumin: 3.9 g/dL (ref 3.5–5.0)
Alkaline Phosphatase: 44 U/L (ref 38–126)
Anion gap: 5 (ref 5–15)
BUN: 20 mg/dL (ref 6–20)
CO2: 24 mmol/L (ref 22–32)
Calcium: 8.6 mg/dL — ABNORMAL LOW (ref 8.9–10.3)
Chloride: 110 mmol/L (ref 98–111)
Creatinine: 0.69 mg/dL (ref 0.44–1.00)
GFR, Estimated: >60 mL/min (ref >60)
Glucose, Bld: 97 mg/dL (ref 70–99)
Potassium: 3.9 mmol/L (ref 3.5–5.1)
Sodium: 139 mmol/L (ref 135–145)
Total Bilirubin: 0.3 mg/dL (ref <1.2)
Total Protein: 7.3 g/dL (ref 6.5–8.1)

## 2023-04-20 LAB — CBC WITH DIFFERENTIAL (CANCER CENTER ONLY)
Abs Immature Granulocytes: 0.04 10*3/uL (ref 0.00–0.07)
Basophils Absolute: 0 10*3/uL (ref 0.0–0.1)
Basophils Relative: 1 %
Eosinophils Absolute: 0.1 10*3/uL (ref 0.0–0.5)
Eosinophils Relative: 1 %
HCT: 33.4 % — ABNORMAL LOW (ref 36.0–46.0)
Hemoglobin: 10.8 g/dL — ABNORMAL LOW (ref 12.0–15.0)
Immature Granulocytes: 1 %
Lymphocytes Relative: 30 %
Lymphs Abs: 1.7 10*3/uL (ref 0.7–4.0)
MCH: 27.1 pg (ref 26.0–34.0)
MCHC: 32.3 g/dL (ref 30.0–36.0)
MCV: 83.9 fL (ref 80.0–100.0)
Monocytes Absolute: 0.4 10*3/uL (ref 0.1–1.0)
Monocytes Relative: 7 %
Neutro Abs: 3.5 10*3/uL (ref 1.7–7.7)
Neutrophils Relative %: 60 %
Platelet Count: 427 10*3/uL — ABNORMAL HIGH (ref 150–400)
RBC: 3.98 MIL/uL (ref 3.87–5.11)
RDW: 14.6 % (ref 11.5–15.5)
WBC Count: 5.7 10*3/uL (ref 4.0–10.5)
nRBC: 0 % (ref 0.0–0.2)

## 2023-04-20 LAB — CEA (IN HOUSE-CHCC): CEA (CHCC-In House): <1 ng/mL (ref 0.00–5.00)

## 2023-04-20 MED ORDER — SODIUM CHLORIDE 0.9 % IV SOLN
200.0000 mg | Freq: Once | INTRAVENOUS | Status: AC
  Administered 2023-04-20: 200 mg via INTRAVENOUS
  Filled 2023-04-20: qty 200

## 2023-04-20 MED ORDER — SODIUM CHLORIDE 0.9% FLUSH
10.0000 mL | Freq: Once | INTRAVENOUS | Status: AC
  Administered 2023-04-20: 10 mL

## 2023-04-20 MED ORDER — SODIUM CHLORIDE 0.9 % IV SOLN: Freq: Once | INTRAVENOUS | Status: DC

## 2023-04-20 NOTE — Progress Notes (Signed)
Ok to proceed without Urine pregnancy per provider, patient has already went to the bathroom and unable to provide urine. Patient stated that she is not currently sexually active.

## 2023-04-20 NOTE — Assessment & Plan Note (Signed)
-  MMR loss of [name]6, [name] with high mutation burden, acquired BRCA mutation (+)  --Initially presented with diarrhea 11/04/20, found to have diverticulitis with perforation and also developed an abscess requiring 2 drains, multiple hospital stay  --she started FOLFOX on 06/22/21 but did not respond -she switched to [facility] on 09/08/21. She has been tolerating well overall but has developed joint pain which could be related, overall manageable. She is clinically doing better also with more energy and less abdominal pain   -her CEA has normalized on immunotherapy. -CT CAP on 03/15/22 again showed mixed response -she had PET scan on 04/26/2022 for evaluation of her ovarian cysts, and saw GYN Dr. [name], we decided to monitor it. -She tolerating Keytruda well, with mild joint pain.  She recently started workout at the gym, which has helped her joint pain. -restaging CT from 08/04/2022 showed stable disease in left abdomen and pelvis, no new lesions.  I personally reviewed her scan images, and discussed findings with patient.   -Will continue Keytruda  -repeated CT scan on January 24, 2023 showed continuous response, no new lesions.

## 2023-04-20 NOTE — Progress Notes (Signed)
[facility]   [phone]) [phone] [phone]) [phone]   Clinic Follow up Note   Patient Care Team: [name], MD as PCP - General (Internal Medicine) [name], MD as PCP - Cardiology (Cardiology) [name], [name] as Physician Assistant (Emergency Medicine) Malachy Mood, MD as Consulting Physician (Oncology) [name], MD as Consulting Physician (General Surgery) [name], [name], MD as Consulting Physician (Neurology) [name], [name], MD as Consulting Physician (Obstetrics and Gynecology) [name], MD as Consulting Physician (Gastroenterology) [name], [name], NP as Nurse Practitioner (Nurse Practitioner)  Date of Service:  04/20/2023  CHIEF COMPLAINT: f/u of metastatic colon cancer  CURRENT THERAPY:  Keytruda every 3 weeks  Oncology History   Malignant neoplasm of sigmoid colon [facility]) -MMR loss of [name]6, [facility] with high mutation burden, acquired BRCA mutation (+)  --Initially presented with diarrhea 11/04/20, found to have diverticulitis with perforation and also developed an abscess requiring 2 drains, multiple hospital stay  --she started FOLFOX on 06/22/21 but did not respond -she switched to [facility] on 09/08/21. She has been tolerating well overall but has developed joint pain which could be related, overall manageable. She is clinically doing better also with more energy and less abdominal pain   -her CEA has normalized on immunotherapy. -CT CAP on 03/15/22 again showed mixed response -she had PET scan on 04/26/2022 for evaluation of her ovarian cysts, and saw GYN Dr. [name], we decided to monitor it. -She tolerating Keytruda well, with mild joint pain.  She recently started workout at the gym, which has helped her joint pain. -restaging CT from 08/04/2022 showed stable disease in left abdomen and pelvis, no new lesions.  I personally reviewed her scan images, and discussed findings with patient.   -Will  continue Keytruda  -repeated CT scan on January 24, 2023 showed continuous response, no new lesions.     Assessment and Plan    Metastatic Colon Cancer Follow-up for metastatic colon cancer. Recent CT scan shows subtle changes including a soft tissue nodule near the gallbladder, a cystic lesion in the ovary, and a possible lymph node in the gallbladder area. The ovarian cystic lesion has increased in size, possibly due to menstruation. She reports no pain but experiences fatigue post-treatment. Tumor marker results are pending. Discussed potential switch to chemotherapy or immunotherapy (nivolumab and ipilimumab) if progression occurs. PET scan may provide more information; Signatera blood test considered if imaging is inconclusive. Explained limitations of blood tests and scans due to physiological and benign changes. - Order PET scan to evaluate CT scan changes - Consult radiologist for next scan if needed - Consider switching to chemotherapy or immunotherapy (nivolumab and ipilimumab) if progression occurs - Consider Signatera blood test if imaging is inconclusive - Proceed with scheduled treatment on December 4 - Postpone December 25 treatment to January 2 or 3  Hypertension Elevated blood pressure noted, potentially due to port-related pain or irritation. - Repeat blood pressure measurement in the infusion room  Hypokalemia Previous potassium level was 3.4. Current potassium level results are pending. - Monitor potassium levels and inform if low  Joint Pain Improved with methocarbamol (Robaxin). Occasional pain but improved mobility. - Continue methocarbamol as needed  General Health Maintenance Gaining weight with good appetite. Advised dietary modifications. - Advise reducing intake of sweets and carbohydrates  Plan -Restaging CT scan reviewed -Will order PET scan to be done in the next 2 to 3 weeks -Follow-up in the next cycle Keytruda in 3 weeks  SUMMARY OF  ONCOLOGIC HISTORY: Oncology History Overview Note   Cancer Staging  Malignant neoplasm of sigmoid colon [facility]) Staging form: Colon and Rectum, AJCC 8th Edition - Pathologic stage from 04/15/2021: Stage IIC (pT4b, pN0, cM0) - Signed by Malachy Mood, MD on 06/12/2021    Malignant neoplasm of sigmoid colon [facility])   Initial Diagnosis   Malignant neoplasm of sigmoid colon (HCC)   11/04/2020 Imaging   EXAM: CT ABDOMEN AND PELVIS WITHOUT CONTRAST  IMPRESSION: 1. Perforating descending colonic diverticulitis with multiple abdominopelvic gas and fluid collections, measuring up to 5.7 cm and further described above. 2. Cholelithiasis without findings of acute cholecystitis. 3. 3.6 cm benign left adrenal adenoma. 4. Leiomyomatous uterus. 5. 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   11/07/2020 Imaging   EXAM: CT ABDOMEN AND PELVIS WITHOUT CONTRAST  IMPRESSION: Continued wall thickening is seen involving descending colon suggesting infectious or inflammatory colitis or perforated diverticulitis. There is an adjacent fluid collection measuring 7.0 x 5.0 cm consistent with abscess which is significantly enlarged compared to prior exam. Adjacent to the abscess, there is a severely thickened small bowel loop most consistent with secondary inflammation.   5.2 x 3.5 cm fluid collection is noted within the left psoas muscle consistent with abscess which is significantly enlarged compared to prior exam. 4.4 x 3.8 cm fluid collection consistent with abscess is noted in the left retroperitoneal region which is also enlarged compared to prior exam.   Fibroid uterus.   Cholelithiasis.   11/27/2020 Imaging   EXAM: CT ABDOMEN AND PELVIS WITHOUT CONTRAST  IMPRESSION: 1. Significantly interval decreased size of the previously visualized left retroperitoneal and left anterior mid abdominal fluid collections.  There is persistent fat stranding and mural thickening of the mid descending colon at this level. 2. Unchanged leiomyomatous uterus. 3. Unchanged cholelithiasis.   12/11/2020 Imaging   EXAM: CT ABDOMEN AND PELVIS WITHOUT CONTRAST  IMPRESSION: 1. Stable inflammatory changes centered around the distal descending colon in the left lower abdomen. Pericolonic inflammatory changes have minimally changed since 11/27/2020. Small pocket of gas medial to the colon is probably associated with a fistula or small residual abscess collection. 2. Stable position of the two percutaneous drains. The more anterior drain may be extending through a portion of the small bowel. 3. Cholelithiasis. 4. Fibroid uterus. Cannot exclude an ovarian/adnexal cystic structure near the uterine fundus. 5. Left adrenal adenoma.   01/07/2021 Imaging   EXAM: CT ABDOMEN AND PELVIS WITH CONTRAST  IMPRESSION: 1. No new abscesses identified. Similar degree of soft tissue thickening seen in the left pericolonic region. The degree of persistent soft tissues thickening in the pericolonic space further raises suspicions for malignancy. Further evaluation with colonoscopy should be performed. 2. Left retroperitoneal abscess drain unchanged in position. Anterior left abdominal drain again seen terminating within small bowel loop. 3. 2.6 cm mildly sclerotic lesion noted in the L2 vertebral body. Further evaluation with contrast enhanced lumbar spine MRI should be performed.   02/04/2021 Imaging   EXAM: CT ABDOMEN AND PELVIS WITH CONTRAST  IMPRESSION: No new abdominopelvic collections or abscess development in the 1 month interval.   Left anterior drain remains within a loop of small bowel, unchanged.   Left lateral abscess drain remains in the retroperitoneal space adjacent to the iliopsoas muscle with a small amount of residual fluid and air but no measurable collection.   Stable soft tissue prominence and pericolonic  strandy  edema/inflammation about the left descending colon compatible with residual diverticulitis/colitis. Difficult to exclude underlying transmural lesion.   04/01/2021 Imaging   EXAM: CT ABDOMEN AND PELVIS WITH CONTRAST  IMPRESSION: There appears to be significant enhancement and wall thickening involving the descending colon with some degree of traction and involvement of adjacent small bowel loops. This is consistent with the history of diverticulitis and perforation. Stable position of percutaneous drainage catheter is seen adjacent to left psoas muscle with no significant residual fluid remaining. The other percutaneous drainage catheter that was previously noted to be within small bowel loop on prior exam in the left lower quadrant, has significantly retracted and appears to be outside of the peritoneal space at this time.   Since the prior exam, there does appear to be some degree of rotation involving mesenteric vessels and structures in the right lower quadrant, suggesting partial volvulus or malrotation. There is the interval development of several lymph nodes in this area, most likely inflammatory or reactive in etiology. Mild amount of free fluid is also noted in the pelvis. However, no significant bowel wall thickening or dilatation is seen in this area. These results will be called to the ordering clinician or representative by the Radiologist Assistant, and communication documented in the PACS or zVision Dashboard.   Stable uterine fibroid.   Hepatic steatosis.   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   Cholelithiasis.   04/10/2021 Imaging   EXAM: CT ANGIOGRAPHY CHEST CT ABDOMEN AND PELVIS WITH CONTRAST  IMPRESSION: 1. Again seen are findings compatible with descending colon diverticulitis with perforation. Free air and inflammation have increased in the interval. 2. New lobulated enhancing fluid collection posterior to this segment of inflamed colon measuring  8.5 x 3.5 x 10.0 cm. This collection now invades the adjacent iliopsoas muscle as well as extends through the left lateral abdominal wall. The tip of the drainage catheter is in this collection. Findings are compatible with abscess. 3. Anterior left percutaneous drainage catheter tip has been pulled back and is now within the subcutaneous tissues. 4. Trace free fluid. 5. No pulmonary embolism.  No acute cardiopulmonary process. 6. Cholelithiasis. 7. Fatty infiltration of the liver.   04/15/2021 Definitive Surgery   FINAL MICROSCOPIC DIAGNOSIS:   A. SMALL BOWEL, RESECTION:  - Adenocarcinoma.  - No carcinoma identified in 1 lymph node.   B. PERFORATED LEFT COLON, RESECTION:  - Moderately differentiated colonic adenocarcinoma.  - Tumor extends into pericolonic adipose tissue, and is strongly  suggestive of invasion into small bowel.  See oncology table/comments.  - No carcinoma identified in 4 lymph nodes (0/4).  - Tubular adenoma with high-grade dysplasia, 1.  - Tubular adenomas with low grade dysplasia, 3.   Comments: The size of the tumor is difficult to estimate secondary to the disrupted nature of the specimen, as well as the infiltrative nature of the tumor.  Tumor can be identified as definitely invading into the pericolonic adipose tissue (block B4), but given the extreme disruption of the tissue, and the involvement of the small bowel by what appears to be a colonic adenocarcinoma, I favor perforation of the large bowel with direct invasion into the small bowel.  Accordingly, I believe this is best regarded as a pT4b lesion.   ADDENDUM:  Mismatch Repair Protein (IHC)  SUMMARY INTERPRETATION: ABNORMAL  There is loss of the major MMR protein [name]6: This indicates a high probability that a hereditary germline mutation is present and referral to genetic counseling is warranted. It is recommended that  the loss of protein expression be correlated with molecular based microsatellite  instability testing.   IHC EXPRESSION RESULTS  TEST           RESULT  MLH1:          Preserved nuclear expression  [name]2:          Preserved nuclear expression  [name]6:          LOSS OF NUCLEAR EXPRESSION  PMS2:          Preserved nuclear expression    04/15/2021 Cancer Staging   Staging form: Colon and Rectum, AJCC 8th Edition - Pathologic stage from 04/15/2021: Stage IIC (pT4b, pN0, cM0) - Signed by Malachy Mood, MD on 06/12/2021 Stage prefix: Initial diagnosis Total positive nodes: 0 Histologic grading system: 4 grade system Histologic grade (G): G2 Residual tumor (R): R0 - None   04/29/2021 Imaging   EXAM: CT ABDOMEN AND PELVIS WITH CONTRAST  IMPRESSION: Continued left retroperitoneal abscesses inferior to the left kidney, involving the left psoas muscle and left abdominal wall musculature. Overall size is decreased since prior study. Interval removal of left lower quadrant abscess drainage catheter.   New fluid collection in the cul-de-sac and wrapping around the uterus concerning for abscess.   Cholelithiasis.   Small left pleural effusion.  Bibasilar atelectasis.   Stable left adrenal adenoma.  ADDENDUM: After discussing the case with Dr. Bryn Gulling in interventional radiology, it was noted that the fluid in the pelvis originally thought to be in the cul-de-sac is likely within the vagina, best seen on sagittal imaging. Recommend speculum exam.   Also, in the left lateral wall in the area of prior abscess in the lateral wall abdominal musculature, some of the abdomen soft tissue appears to be enhancing. While this could be infectious, cannot exclude tumor seeding in the left lateral abdominal wall, measuring 6.9 x 3.8 cm on image 64 of series 2.   06/16/2021 Imaging   EXAM: CT ABDOMEN AND PELVIS WITH CONTRAST  IMPRESSION: Increased size of bulky soft tissue masses involving the left psoas muscle and left lateral abdominal wall soft tissues, consistent with progressive  metastatic disease.   Significant progression of multiple peritoneal masses throughout the abdomen and pelvis, consistent with peritoneal carcinomatosis.   New mild retroperitoneal lymphadenopathy, consistent with metastatic disease.   Stable uterine fibroid and left adrenal adenoma.   Cholelithiasis, without evidence of cholecystitis.   06/22/2021 - 08/19/2021 Chemotherapy   Patient is on Treatment Plan : COLORECTAL FOLFOX q14d x 6 months     06/22/2021 Tumor Marker   Patient's tumor was tested for the following markers: CEA. Results of the tumor marker test revealed 87.41.   07/10/2021 Pathology Results   FINAL MICROSCOPIC DIAGNOSIS:   A. PERITONEAL MASS, LEFT UPPER ABDOMINAL QUADRANT, BIOPSY:  -  Metastatic adenocarcinoma with necrosis, histologically consistent with colon primary.     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 The report date is 07/16/2021. The remainder of testing was negative/normal.  The CancerNext-Expanded + RNAinsight gene panel offered by [employer] and includes sequencing and rearrangement analysis for the following 77 genes: IP, ALK, APC*, ATM*, AXIN2, BAP1, BARD1, BLM, BMPR1A, BRCA1*, BRCA2*, BRIP1*, CDC73, CDH1*,CDK4, CDKN1B, CDKN2A, CHEK2*, CTNNA1, DICER1, FANCC, FH, FLCN, GALNT12, KIF1B, LZTR1, MAX, MEN1, MET, MLH1*, [name]2*, [name]3, [name]6*, MUTYH*, NBN, NF1*, NF2, NTHL1, PALB2*, PHOX2B, PMS2*, POT1, PRKAR1A, PTCH1, PTEN*, RAD51C*, RAD51D*,RB1, RECQL, RET, SDHA, SDHAF2, SDHB, SDHC, SDHD, SMAD4, SMARCA4, SMARCB1, SMARCE1, STK11,  SUFU, TMEM127, TP53*,TSC1, TSC2, VHL and XRCC2 (sequencing and deletion/duplication); EGFR, EGLN1, HOXB13, KIT, MITF, PDGFRA, POLD1 and POLE (sequencing only); EPCAM and GREM1 (deletion/duplication only).   08/28/2021 Imaging   EXAM: CT CHEST, ABDOMEN, AND PELVIS WITH CONTRAST  IMPRESSION: 1. Continued interval progression of multiple metastatic soft  tissue nodules/masses in the abdomen and pelvis. 2. No evidence for metastatic disease in the chest. 3. Similar appearance of the subtle lesion in the L2 vertebral body, suspicious for metastatic involvement. 4. Cholelithiasis.   09/03/2021 -  Chemotherapy   Patient is on Treatment Plan : COLORECTAL Pembrolizumab (200) q21d     09/08/2021 - 02/01/2022 Chemotherapy   Patient is on Treatment Plan : COLORECTAL Pembrolizumab (200) q21d     01/08/2022 Imaging   CT CAP IMPRESSION: 1. Mixed response to therapy. Interval decrease in size of the conglomerate multilobulated mass extending from the left psoas into the left posterior pararenal space through the left lateral abdominal wall along the tract of the previously placed percutaneous drainage catheter. Interval decrease in size of right adnexal mass and omental masses within the right lower quadrant of the abdomen. Interval development of peritoneal carcinomatosis with omental caking within the left lower quadrant of the abdomen and left subdiaphragmatic region adjacent to the spleen and along the left pericolic gutter as well as development of ascites appearing loculated within the anterior peritoneum. 2. Interval development of mild left hydronephrosis secondary to stricturing of the mid left ureter. 3. Stable left adrenal metastasis. 4. Stable sclerotic lesion within the L2 vertebral body. No new lytic or blastic bone lesions are seen. 5. Cholelithiasis. 6. No evidence of intrathoracic metastatic disease. 7. 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 Moderate colonic stool burden. No evidence of obstruction.   03/15/2022 Imaging   IMPRESSION: 1. Status post sigmoid colon resection with left lower quadrant end colostomy. 2. Multicystic bilateral ovarian lesions are slightly increased in size. 3. Diminished, small volume of loculated appearing ascites throughout the  abdomen and pelvis, with similar appearance of peritoneal thickening and nodularity throughout. 4. Heterogeneously calcified, conglomerate masses in the left retroperitoneum and abdominal wall are slightly diminished in size. 5. Left adrenal lesion not significantly changed. 6. Unchanged faintly sclerotic metastatic lesion of L2. 7. Constellation of findings is consistent with mixed response to treatment however with overall little interval change. 8. Minimal residual left hydronephrosis and proximal hydroureter, improved compared to prior examination. 9. Cholelithiasis. 10. Coronary artery disease, significantly advanced for patient age.   08/04/2022 Imaging    IMPRESSION: CT CHEST:   1. No developing mass lesion, fluid collection or lymph node enlargement in the thorax. Stable tiny 3 mm lung nodule. 2. Coronary artery calcifications. Please correlate for other coronary risk factors. 3. Chest port   CT ABDOMEN AND PELVIS:   1. Extensive surgical changes identified. Left lower quadrant ostomy, diverting colostomy. Surgical changes along loops of small bowel in the right hemiabdomen. 2. Once again there are areas of nodular tissue extending along the left lateral abdominal wall, into the retroperitoneum which are similar to previous. The amount of adjacent fluid in these locations has improved. 3. No new mass lesion, fluid collection or lymph node enlargement in the abdomen or pelvis. 4. Gallstones. 5. Enlarged uterus with fibroids. Bilateral complex cystic areas are once again seen and based on appearance differential includes dilated fallopian tubes, hydrosalpinx versus true cystic lesions. Please correlate for any known history or prior workup 6. 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 No developing mass lesion, fluid collection or lymph node enlargement in the thorax. Stable tiny 3 mm lung nodule. 2. Coronary artery calcifications. Please correlate for other coronary risk factors. 3. Chest port   CT ABDOMEN AND PELVIS:   1. Extensive surgical changes identified. Left lower quadrant ostomy, diverting colostomy. Surgical changes along loops of small bowel in the right hemiabdomen. 2. Once again there are areas of nodular tissue extending along the left lateral abdominal wall, into the retroperitoneum which are similar to previous. The amount of adjacent fluid in these locations has improved. 3. No new mass lesion, fluid collection or lymph node enlargement in the abdomen or pelvis. 4. Gallstones. 5. Enlarged uterus with fibroids. Bilateral complex cystic areas are once again seen and based on appearance differential includes dilated fallopian tubes, hydrosalpinx versus true cystic lesions. Please correlate for any known history or prior workup 6. Limited evaluation for solid organ pathology metastatic disease without the advantage of IV contrast        Discussed the use of AI scribe software for clinical note transcription with the patient, who gave verbal consent to proceed.  History of Present Illness   The patient, with a history of metastatic colon cancer, presents for a follow-up visit. The patient reports discomfort with her port during a recent blood draw, describing it as a painful experience. The patient also mentions elevated blood pressure and blood sugar levels, which are unusual for her. Despite undergoing chemotherapy, the patient has been gaining weight and is trying to cut back on food intake, particularly sweets and carbohydrates. The patient has been experiencing joint pain, which has improved with the use of  methocarbamol (Robaxin). The patient also mentions a back issue, which has improved. The patient reports feeling tired after treatment but otherwise feels fine.         All other systems were reviewed with the patient and are negative.  MEDICAL HISTORY:  Past Medical History:  Diagnosis Date   Allergy    seasonal   Anemia    Blood infection 1985   Blood transfusion without reported diagnosis    colon ca 04/2021   Diverticulitis    Family history of breast cancer    Family history of colon cancer    Family history of colon cancer    Family history of stomach cancer    Hypertension    Obesity    Sleep apnea     SURGICAL HISTORY: Past Surgical History:  Procedure Laterality Date   COLECTOMY WITH COLOSTOMY CREATION/HARTMANN PROCEDURE N/A 04/15/2021   Procedure: COLOSTOMY CREATION/HARTMANN PROCEDURE;  Surgeon: [name], MD;  Location: [facility];  Service: General;  Laterality: N/A;   IR CATHETER TUBE CHANGE  12/12/2020   IR CATHETER TUBE CHANGE  01/27/2021   IR CATHETER TUBE CHANGE  02/19/2021   IR CATHETER TUBE CHANGE  04/13/2021   IR IMAGING GUIDED PORT INSERTION  10/02/2021   IR RADIOLOGIST EVAL & MGMT  11/27/2020   IR RADIOLOGIST EVAL & MGMT  12/11/2020   IR RADIOLOGIST EVAL & MGMT  01/07/2021   IR RADIOLOGIST EVAL & MGMT  02/04/2021  IR SINUS/FIST TUBE CHK-NON GI  12/12/2020   IR SINUS/FIST TUBE CHK-NON GI  02/19/2021   LAPAROSCOPIC PARTIAL COLECTOMY N/A 04/15/2021   Procedure: LAPAROSCOPIC ASSISTED HARTMANN RESECTION;  Surgeon: [name], MD;  Location: [facility];  Service: General;  Laterality: N/A;    I have reviewed the social history and family history with the patient and they are unchanged from previous note.  ALLERGIES:  is allergic to chlorhexidine gluconate, shellfish allergy, and penicillins.  MEDICATIONS:  Current Outpatient Medications  Medication Sig Dispense Refill   ALPRAZolam (XANAX) 0.25 MG tablet Take 1 tablet (0.25 mg total) by mouth at bedtime  as needed for anxiety. 30 tablet 0   amLODipine (NORVASC) 10 MG tablet TAKE 1 TABLET BY MOUTH EVERY DAY 90 tablet 3   furosemide (LASIX) 20 MG tablet TAKE 1 TABLET BY MOUTH EVERY DAY AS NEEDED 90 tablet 1   KLOR-CON M20 20 MEQ tablet TAKE 1 TABLET BY MOUTH EVERY DAY 90 tablet 1   lidocaine-prilocaine (EMLA) cream Apply 1 Application topically as needed. 30 g 3   methocarbamol (ROBAXIN-750) 750 MG tablet Take 1 tablet (750 mg total) by mouth every 8 (eight) hours as needed for muscle spasms. 30 tablet 0   Metoprolol Tartrate 75 MG TABS Take 1 tablet (75 mg total) by mouth 2 (two) times daily. (Patient taking differently: Take 75 mg by mouth 2 (two) times daily. Takes 1/2 tablet by mouth 2x daily.) 90 tablet 3   POTASSIUM PO Take by mouth.     pregabalin (LYRICA) 75 MG capsule Take 1 capsule (75 mg total) by mouth at bedtime. X 1 week, then 75 mg BID x 1 week, then can increase to 75 in AM and 150 mg QHS- nerve pain 90 capsule 5   prochlorperazine (COMPAZINE) 10 MG tablet Take 1 tablet (10 mg total) by mouth every 8 (eight) hours as needed for nausea or vomiting. 30 tablet 0   sulfamethoxazole-trimethoprim (BACTRIM DS) 800-160 MG tablet      traZODone (DESYREL) 50 MG tablet TAKE 1-2 TABLETS BY MOUTH AT BEDTIME AS NEEDED FOR SLEEP. 180 tablet 1   No current facility-administered medications for this visit.   Facility-Administered Medications Ordered in Other Visits  Medication Dose Route Frequency Provider Last Rate Last Admin   fentaNYL (SUBLIMAZE) injection   Intravenous PRN Mir, Al Corpus, MD   50 mcg at 10/02/21 1436   midazolam (VERSED) injection   Intravenous PRN Mir, Al Corpus, MD   1 mg at 10/02/21 1436    PHYSICAL EXAMINATION: ECOG PERFORMANCE STATUS: 1 - Symptomatic but completely ambulatory  Vitals:   04/20/23 1354  BP: (!) 196/114  Pulse: (!) 111  Resp: 18  Temp: 98.5 F (36.9 C)  SpO2: 100%   Wt Readings from Last 3 Encounters:  04/20/23 (!) 319 lb 3.2 oz (144.8 kg)   03/30/23 (!) 311 lb 9.6 oz (141.3 kg)  03/29/23 (!) 304 lb (137.9 kg)     GENERAL:alert, no distress and comfortable SKIN: skin color, texture, turgor are normal, no rashes or significant lesions EYES: normal, Conjunctiva are pink and non-injected, sclera clear NECK: supple, thyroid normal size, non-tender, without nodularity LYMPH:  no palpable lymphadenopathy in the cervical, axillary  LUNGS: clear to auscultation and percussion with normal breathing effort HEART: regular rate & rhythm and no murmurs and no lower extremity edema ABDOMEN:abdomen soft, non-tender and normal bowel sounds Musculoskeletal:no cyanosis of digits and no clubbing  NEURO: alert & oriented x 3 with fluent speech, no focal  motor/sensory deficits    LABORATORY DATA:  I have reviewed the data as listed    Latest Ref Rng & Units 04/20/2023   12:43 PM 03/30/2023   11:07 AM 03/09/2023    2:01 PM  CBC  WBC 4.0 - 10.5 K/uL 5.7  5.7  6.7   Hemoglobin 12.0 - 15.0 g/dL 82.9  56.2  13.0   Hematocrit 36.0 - 46.0 % 33.4  34.2  33.3   Platelets 150 - 400 K/uL 427  419  400         Latest Ref Rng & Units 04/20/2023   12:43 PM 03/30/2023   11:07 AM 03/09/2023    2:01 PM  CMP  Glucose 70 - 99 mg/dL 97  865  784   BUN 6 - 20 mg/dL 20  12  19    Creatinine 0.44 - 1.00 mg/dL 6.96  2.95  2.84   Sodium 135 - 145 mmol/L 139  139  139   Potassium 3.5 - 5.1 mmol/L 3.9  3.4  3.6   Chloride 98 - 111 mmol/L 110  108  109   CO2 22 - 32 mmol/L 24  25  24    Calcium 8.9 - 10.3 mg/dL 8.6  8.9  8.9   Total Protein 6.5 - 8.1 g/dL 7.3  7.2  7.1   Total Bilirubin <1.2 mg/dL 0.3  0.3  0.2   Alkaline Phos 38 - 126 U/L 44  51  44   AST 15 - 41 U/L 14  16  11    ALT 0 - 44 U/L 10  16  10        RADIOGRAPHIC STUDIES: I have personally reviewed the radiological images as listed and agreed with the findings in the report. No results found.    Orders Placed This Encounter  Procedures   NM PET Image Restag (PS) Skull Base To  Thigh    Standing Status:   Future    Standing Expiration Date:   04/19/2024    Order Specific Question:   If indicated for the ordered procedure, I authorize the administration of a radiopharmaceutical per Radiology protocol    Answer:   Yes    Order Specific Question:   Is the patient pregnant?    Answer:   No    Order Specific Question:   Preferred imaging location?    Answer:   [facility]   CBC with Differential (Cancer Center Only)    Standing Status:   Future    Standing Expiration Date:   06/08/2024   CMP (Cancer Center only)    Standing Status:   Future    Standing Expiration Date:   06/08/2024   T4    Standing Status:   Future    Standing Expiration Date:   06/08/2024   TSH    Standing Status:   Future    Standing Expiration Date:   06/08/2024   CBC with Differential (Cancer Center Only)    Standing Status:   Future    Standing Expiration Date:   06/29/2024   CMP (Cancer Center only)    Standing Status:   Future    Standing Expiration Date:   06/29/2024   All questions were answered. The patient knows to call the clinic with any problems, questions or concerns. No barriers to learning was detected. The total time spent in the appointment was 30 minutes.     Malachy Mood, MD 04/20/2023

## 2023-04-20 NOTE — Patient Instructions (Signed)

## 2023-05-04 ENCOUNTER — Encounter (HOSPITAL_COMMUNITY)
Admission: RE | Admit: 2023-05-04 | Discharge: 2023-05-04 | Disposition: A | Discharge status: Home / Self Care | Payer: Managed Care, Other (non HMO) | Source: Ambulatory Visit | Attending: Hematology | Admitting: Hematology

## 2023-05-04 DIAGNOSIS — C187 Malignant neoplasm of sigmoid colon: Secondary | ICD-10-CM | POA: Diagnosis present

## 2023-05-04 LAB — GLUCOSE, CAPILLARY: Glucose-Capillary: 63 mg/dL — ABNORMAL LOW (ref 70–99)

## 2023-05-04 MED ORDER — FLUDEOXYGLUCOSE F - 18 (FDG) INJECTION
16.0000 | Freq: Once | INTRAVENOUS | Status: AC
  Administered 2023-05-04: 16.58 via INTRAVENOUS

## 2023-05-08 NOTE — Assessment & Plan Note (Addendum)
-  MMR loss of [name]6, [name] with high mutation burden, acquired BRCA mutation (+)  --Initially presented with diarrhea 11/04/20, found to have diverticulitis with perforation and also developed an abscess requiring 2 drains, multiple hospital stay  --she started FOLFOX on 06/22/21 but did not respond -she switched to [facility] on 09/08/21. She has been tolerating well overall but has developed joint pain which could be related, overall manageable. She is clinically doing better also with more energy and less abdominal pain   -her CEA has normalized on immunotherapy. -CT CAP on 03/15/22 again showed mixed response -she had PET scan on 04/26/2022 for evaluation of her ovarian cysts, and saw GYN Dr. [name], we decided to monitor it. -She tolerating Keytruda well, with mild joint pain.  She recently started workout at the gym, which has helped her joint pain. -restaging CT from 08/04/2022 showed stable disease in left abdomen and pelvis, no new lesions.  I personally reviewed her scan images, and discussed findings with patient.   -Will continue Keytruda  -repeated CT scan on January 24, 2023 showed continuous response, no new lesions.  However there were some subtle findings of concern. Specifically, in the left side of the abdomen there is a 1.4 x 0.8 cm soft tissue attenuation nodule, which could represent a small peritoneal implant. In addition, there is a non dependent polypoid intermediate attenuation lesion associated with the anterior wall of the gallbladder, that could represent a metastatic lesion or primary gallbladder malignancy (or a benign polyp). This could be better evaluated with follow-up MRI of the abdomen with and without IV gadolinium with MRCP.  The large fluid attenuation lesion in the right adnexa again noted. Although similar to the most recent prior study, this has enlarged compared to more remote prior examinations. The possibility of ovarian malignancy should be considered, and further  evaluation with pelvic ultrasound is recommended at this time. -She did have PET/CT done 05/04/2023 for further evaluation of CT CAP findings. PET scan showed multiple subcentimeter peritoneal nodules in the left abdomen which have decreased in size and hypermetabolism.  This is consistent with treated metastatic disease.  There is chronic focal hypermetabolism associated with some associated with gallbladder nodule.  Questionable for malignancies.  This has been demonstrated on prior exams.  There are also external iliac, left inguinal, and bilateral cervical lymph nodes which are slightly hypermetabolic.  There is also a cystic right adnexal mass. Patient to continue with Keytruda every 3 weeks. Will have next infusion in early January 2025.

## 2023-05-08 NOTE — Progress Notes (Unsigned)
Patient Care Team: [name], MD as PCP - General (Internal Medicine) [name], MD as PCP - Cardiology (Cardiology) [name], [name] as Physician Assistant (Emergency Medicine) Malachy Mood, MD as Consulting Physician (Oncology) [name], MD as Consulting Physician (General Surgery) [name], [name], MD as Consulting Physician (Neurology) [name], [name], MD as Consulting Physician (Obstetrics and Gynecology) [name], MD as Consulting Physician (Gastroenterology) [name], [name], NP as Nurse Practitioner (Nurse Practitioner)  Clinic Day:  05/11/2023  Referring physician: Malachy Mood, MD  ASSESSMENT & PLAN:   Assessment & Plan: Malignant neoplasm of sigmoid colon [facility]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 She has been tolerating well overall but has developed joint pain which could be related, overall manageable. She is clinically doing better also with more energy and less abdominal pain   -her CEA has normalized on immunotherapy. -CT CAP on 03/15/22 again showed mixed response -she had PET scan on 04/26/2022 for evaluation of her ovarian cysts, and saw GYN Dr. [name], we decided to monitor it. -She tolerating Keytruda well, with mild joint pain.  She recently started workout at the gym, which has helped her joint pain. -restaging CT from 08/04/2022 showed stable disease in left abdomen and pelvis, no new lesions.  I personally reviewed her scan images, and discussed findings with patient.   -Will continue Keytruda  -repeated CT scan on January 24, 2023 showed continuous response, no new lesions.  However there were some subtle findings of  concern. Specifically, in the left side of the abdomen there is a 1.4 x 0.8 cm soft tissue attenuation nodule, which could represent a small peritoneal implant. In addition, there is a non dependent polypoid intermediate attenuation lesion associated with the anterior wall of the gallbladder, that could represent a metastatic lesion or primary gallbladder malignancy (or a benign polyp). This could be better evaluated with follow-up MRI of the abdomen with and without IV gadolinium with MRCP.  The large fluid attenuation lesion in the right adnexa again noted. Although similar to the most recent prior study, this has enlarged compared to more remote prior examinations. The possibility of ovarian malignancy should be considered, and further evaluation with pelvic ultrasound is recommended at this time. -She did have PET/CT done 05/04/2023 for further evaluation of CT CAP findings. PET scan showed multiple subcentimeter peritoneal nodules in the left abdomen which have decreased in size and hypermetabolism.  This is consistent with treated metastatic disease.  There is chronic focal hypermetabolism associated with some associated with gallbladder nodule.  Questionable for malignancies.  This has been demonstrated on prior exams.  There are also external iliac, left inguinal, and bilateral cervical lymph nodes which are slightly hypermetabolic.  There is also a cystic right adnexal mass. Patient to continue with Keytruda every 3 weeks. Will have next infusion in early January 2025.    Plan: Labs reviewed  -CBC showing WBC 6.7; Hgb 10.8;  hct 32.9; Plt 411; Anc 4.1 -CMP - K 3.7; glucose 90; BUN 16; Creatinine 0.76; eGFR > 60; Ca 8.7; LFTs normal.   -PET scan reviewed with the patient.  There are subcentimeter peritoneal nodules in the left abdomen which have decreased in size and hypermetabolism.  This is consistent with treated  metastatic disease.  There is chronic focal hypermetabolism associated with some  associated with gallbladder nodule.  Questionable for malignancies.  This has been demonstrated on prior exams.  There are also external iliac, left inguinal, and bilateral cervical lymph nodes which are slightly hypermetabolic.  There is also a cystic right adnexal mass.  I did reach out to Dr. Pricilla Holm, GYN ONC for recommendations regarding the right complex cystic mass. Proceed with Keytruda as scheduled. Labs/flush, follow-up, and treatment to restart in the new year.  The patient understands the plans discussed today and is in agreement with them.  She knows to contact our office if she develops concerns prior to her next appointment.  I provided 30 minutes of face-to-face time during this encounter and > 50% was spent counseling as documented under my assessment and plan.    Carlean Jews, NP  [facility] - A DEPT OF [facility] [address] [place] [address] Dept: [phone] Dept Fax: [phone]   No orders of the defined types were placed in this encounter.     CHIEF COMPLAINT:  CC: Malignant neoplasm of sigmoid colon  Current Treatment: Keytruda every 3 weeks  INTERVAL HISTORY:  Amylynn is here today for repeat clinical assessment.  She was last seen by Dr. Blake Divine on 04/20/2023.  Restaging CT scan on January 24, 2023 showed continuous response, no new lesions.  However there were some subtle findings of concern. Specifically, in the left side of the abdomen there is a 1.4 x 0.8 cm soft tissue attenuation nodule, which could represent a small peritoneal implant. In addition, there is a non dependent polypoid intermediate attenuation lesion associated with the anterior wall of the gallbladder, that could represent a metastatic lesion or primary gallbladder malignancy (or a benign polyp). This could be better evaluated with follow-up MRI of the abdomen with and without IV gadolinium with MRCP.  The large fluid  attenuation lesion in the right adnexa again noted. Although similar to the most recent prior study, this has enlarged compared to more remote prior examinations. The possibility of ovarian malignancy should be considered, and further evaluation with pelvic ultrasound is recommended at this time. She did have PET/CT done 05/04/2023 for further evaluation of CT CAP findings. PET showed multiple subcentimeter peritoneal nodules in the left abdomen which have decreased in size and hypermetabolism.  This is consistent with treated metastatic disease.  There is chronic focal hypermetabolism associated with some associated with gallbladder nodule.  Questionable for malignancies.  This has been demonstrated on prior exams.  There are external iliac, left inguinal, and bilateral cervical lymph nodes which are slightly hypermetabolic.  There is also a cystic right adnexal mass.  The patient states that she was sick with upper respiratory illness during time of her PET scan.  Have contributed to borderline hypermetabolic cervical lymph nodes.  She continues to see her primary care for back pain.  She has not taken Robaxin when needed.  This is helping.  She is staying active.  Back pain is improving.  She denies chest pain, chest pressure, or shortness of breath. She denies headaches or visual disturbances. She denies abdominal pain, nausea, vomiting, or changes in bowel or bladder habits.  Her appetite is good. Her weight has been stable.  I have reviewed the past medical history, past surgical history, social history and family history with the patient and they are unchanged from previous note.  ALLERGIES:  is  allergic to chlorhexidine gluconate, shellfish allergy, and penicillins.  MEDICATIONS:  Current Outpatient Medications  Medication Sig Dispense Refill   ALPRAZolam (XANAX) 0.25 MG tablet Take 1 tablet (0.25 mg total) by mouth at bedtime as needed for anxiety. 30 tablet 0   amLODipine (NORVASC) 10 MG tablet  TAKE 1 TABLET BY MOUTH EVERY DAY 90 tablet 3   furosemide (LASIX) 20 MG tablet TAKE 1 TABLET BY MOUTH EVERY DAY AS NEEDED 90 tablet 1   KLOR-CON M20 20 MEQ tablet TAKE 1 TABLET BY MOUTH EVERY DAY 90 tablet 1   methocarbamol (ROBAXIN-750) 750 MG tablet Take 1 tablet (750 mg total) by mouth every 8 (eight) hours as needed for muscle spasms. 30 tablet 0   Metoprolol Tartrate 75 MG TABS Take 1 tablet (75 mg total) by mouth 2 (two) times daily. (Patient taking differently: Take 75 mg by mouth 2 (two) times daily. Takes 1/2 tablet by mouth 2x daily.) 90 tablet 3   POTASSIUM PO Take by mouth.     pregabalin (LYRICA) 75 MG capsule Take 1 capsule (75 mg total) by mouth at bedtime. X 1 week, then 75 mg BID x 1 week, then can increase to 75 in AM and 150 mg QHS- nerve pain 90 capsule 5   prochlorperazine (COMPAZINE) 10 MG tablet Take 1 tablet (10 mg total) by mouth every 8 (eight) hours as needed for nausea or vomiting. 30 tablet 0   sulfamethoxazole-trimethoprim (BACTRIM DS) 800-160 MG tablet      traZODone (DESYREL) 50 MG tablet TAKE 1-2 TABLETS BY MOUTH AT BEDTIME AS NEEDED FOR SLEEP. 180 tablet 1   lidocaine-prilocaine (EMLA) cream Apply 1 Application topically as needed. 30 g 3   No current facility-administered medications for this visit.   Facility-Administered Medications Ordered in Other Visits  Medication Dose Route Frequency Provider Last Rate Last Admin   fentaNYL (SUBLIMAZE) injection   Intravenous PRN Mir, Al Corpus, MD   50 mcg at 10/02/21 1436   midazolam (VERSED) injection   Intravenous PRN Mir, Al Corpus, MD   1 mg at 10/02/21 1436   sodium chloride flush (NS) 0.9 % injection 10 mL  10 mL Intracatheter PRN Malachy Mood, MD   10 mL at 05/11/23 1231    HISTORY OF PRESENT ILLNESS:   Oncology History Overview Note   Cancer Staging  Malignant neoplasm of sigmoid colon [facility]) Staging form: Colon and Rectum, AJCC 8th Edition - Pathologic stage from 04/15/2021: Stage IIC (pT4b, pN0, cM0) -  Signed by Malachy Mood, MD on 06/12/2021    Malignant neoplasm of sigmoid colon [facility])   Initial Diagnosis   Malignant neoplasm of sigmoid colon (HCC)   11/04/2020 Imaging   EXAM: CT ABDOMEN AND PELVIS WITHOUT CONTRAST  IMPRESSION: 1. Perforating descending colonic diverticulitis with multiple abdominopelvic gas and fluid collections, measuring up to 5.7 cm and further described above. 2. Cholelithiasis without findings of acute cholecystitis. 3. 3.6 cm benign left adrenal adenoma. 4. Leiomyomatous uterus. 5. 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   11/07/2020 Imaging   EXAM: CT ABDOMEN AND PELVIS WITHOUT CONTRAST  IMPRESSION: Continued wall thickening is seen involving descending colon suggesting infectious or inflammatory colitis or perforated diverticulitis. There is an adjacent fluid collection measuring 7.0 x 5.0 cm consistent with abscess which is significantly enlarged compared to prior exam. Adjacent to the abscess, there is a severely thickened small bowel loop most consistent with secondary  inflammation.   5.2 x 3.5 cm fluid collection is noted within the left psoas muscle consistent with abscess which is significantly enlarged compared to prior exam. 4.4 x 3.8 cm fluid collection consistent with abscess is noted in the left retroperitoneal region which is also enlarged compared to prior exam.   Fibroid uterus.   Cholelithiasis.   11/27/2020 Imaging   EXAM: CT ABDOMEN AND PELVIS WITHOUT CONTRAST  IMPRESSION: 1. Significantly interval decreased size of the previously visualized left retroperitoneal and left anterior mid abdominal fluid collections. There is persistent fat stranding and mural thickening of the mid descending colon at this level. 2. Unchanged leiomyomatous uterus. 3. Unchanged cholelithiasis.   12/11/2020 Imaging   EXAM: CT ABDOMEN AND PELVIS WITHOUT  CONTRAST  IMPRESSION: 1. Stable inflammatory changes centered around the distal descending colon in the left lower abdomen. Pericolonic inflammatory changes have minimally changed since 11/27/2020. Small pocket of gas medial to the colon is probably associated with a fistula or small residual abscess collection. 2. Stable position of the two percutaneous drains. The more anterior drain may be extending through a portion of the small bowel. 3. Cholelithiasis. 4. Fibroid uterus. Cannot exclude an ovarian/adnexal cystic structure near the uterine fundus. 5. Left adrenal adenoma.   01/07/2021 Imaging   EXAM: CT ABDOMEN AND PELVIS WITH CONTRAST  IMPRESSION: 1. No new abscesses identified. Similar degree of soft tissue thickening seen in the left pericolonic region. The degree of persistent soft tissues thickening in the pericolonic space further raises suspicions for malignancy. Further evaluation with colonoscopy should be performed. 2. Left retroperitoneal abscess drain unchanged in position. Anterior left abdominal drain again seen terminating within small bowel loop. 3. 2.6 cm mildly sclerotic lesion noted in the L2 vertebral body. Further evaluation with contrast enhanced lumbar spine MRI should be performed.   02/04/2021 Imaging   EXAM: CT ABDOMEN AND PELVIS WITH CONTRAST  IMPRESSION: No new abdominopelvic collections or abscess development in the 1 month interval.   Left anterior drain remains within a loop of small bowel, unchanged.   Left lateral abscess drain remains in the retroperitoneal space adjacent to the iliopsoas muscle with a small amount of residual fluid and air but no measurable collection.   Stable soft tissue prominence and pericolonic strandy edema/inflammation about the left descending colon compatible with residual diverticulitis/colitis. Difficult to exclude underlying transmural lesion.   04/01/2021 Imaging   EXAM: CT ABDOMEN AND PELVIS WITH  CONTRAST  IMPRESSION: There appears to be significant enhancement and wall thickening involving the descending colon with some degree of traction and involvement of adjacent small bowel loops. This is consistent with the history of diverticulitis and perforation. Stable position of percutaneous drainage catheter is seen adjacent to left psoas muscle with no significant residual fluid remaining. The other percutaneous drainage catheter that was previously noted to be within small bowel loop on prior exam in the left lower quadrant, has significantly retracted and appears to be outside of the peritoneal space at this time.   Since the prior exam, there does appear to be some degree of rotation involving mesenteric vessels and structures in the right lower quadrant, suggesting partial volvulus or malrotation. There is the interval development of several lymph nodes in this area, most likely inflammatory or reactive in etiology. Mild amount of free fluid is also noted in the pelvis. However, no significant bowel wall thickening or dilatation is seen in this area. These results will be called to the ordering clinician or representative by the Radiologist Assistant,  and communication documented in the PACS or zVision Dashboard.   Stable uterine fibroid.   Hepatic steatosis.   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   Cholelithiasis.   04/10/2021 Imaging   EXAM: CT ANGIOGRAPHY CHEST CT ABDOMEN AND PELVIS WITH CONTRAST  IMPRESSION: 1. Again seen are findings compatible with descending colon diverticulitis with perforation. Free air and inflammation have increased in the interval. 2. New lobulated enhancing fluid collection posterior to this segment of inflamed colon measuring 8.5 x 3.5 x 10.0 cm. This collection now invades the adjacent iliopsoas muscle as well as extends through the left lateral abdominal wall. The tip of the drainage catheter is in this collection. Findings are  compatible with abscess. 3. Anterior left percutaneous drainage catheter tip has been pulled back and is now within the subcutaneous tissues. 4. Trace free fluid. 5. No pulmonary embolism.  No acute cardiopulmonary process. 6. Cholelithiasis. 7. Fatty infiltration of the liver.   04/15/2021 Definitive Surgery   FINAL MICROSCOPIC DIAGNOSIS:   A. SMALL BOWEL, RESECTION:  - Adenocarcinoma.  - No carcinoma identified in 1 lymph node.   B. PERFORATED LEFT COLON, RESECTION:  - Moderately differentiated colonic adenocarcinoma.  - Tumor extends into pericolonic adipose tissue, and is strongly  suggestive of invasion into small bowel.  See oncology table/comments.  - No carcinoma identified in 4 lymph nodes (0/4).  - Tubular adenoma with high-grade dysplasia, 1.  - Tubular adenomas with low grade dysplasia, 3.   Comments: The size of the tumor is difficult to estimate secondary to the disrupted nature of the specimen, as well as the infiltrative nature of the tumor.  Tumor can be identified as definitely invading into the pericolonic adipose tissue (block B4), but given the extreme disruption of the tissue, and the involvement of the small bowel by what appears to be a colonic adenocarcinoma, I favor perforation of the large bowel with direct invasion into the small bowel.  Accordingly, I believe this is best regarded as a pT4b lesion.   ADDENDUM:  Mismatch Repair Protein (IHC)  SUMMARY INTERPRETATION: ABNORMAL  There is loss of the major MMR protein [name]6: This indicates a high probability that a hereditary germline mutation is present and referral to genetic counseling is warranted. It is recommended that the loss of protein expression be correlated with molecular based microsatellite instability testing.   IHC EXPRESSION RESULTS  TEST           RESULT  MLH1:          Preserved nuclear expression  [name]2:          Preserved nuclear expression  [name]6:          LOSS OF NUCLEAR EXPRESSION  PMS2:           Preserved nuclear expression    04/15/2021 Cancer Staging   Staging form: Colon and Rectum, AJCC 8th Edition - Pathologic stage from 04/15/2021: Stage IIC (pT4b, pN0, cM0) - Signed by Malachy Mood, MD on 06/12/2021 Stage prefix: Initial diagnosis Total positive nodes: 0 Histologic grading system: 4 grade system Histologic grade (G): G2 Residual tumor (R): R0 - None   04/29/2021 Imaging   EXAM: CT ABDOMEN AND PELVIS WITH CONTRAST  IMPRESSION: Continued left retroperitoneal abscesses inferior to the left kidney, involving the left psoas muscle and left abdominal wall musculature. Overall size is decreased since prior study. Interval removal of left lower quadrant abscess drainage catheter.   New fluid collection in the cul-de-sac and wrapping around the uterus concerning  for abscess.   Cholelithiasis.   Small left pleural effusion.  Bibasilar atelectasis.   Stable left adrenal adenoma.  ADDENDUM: After discussing the case with Dr. Bryn Gulling in interventional radiology, it was noted that the fluid in the pelvis originally thought to be in the cul-de-sac is likely within the vagina, best seen on sagittal imaging. Recommend speculum exam.   Also, in the left lateral wall in the area of prior abscess in the lateral wall abdominal musculature, some of the abdomen soft tissue appears to be enhancing. While this could be infectious, cannot exclude tumor seeding in the left lateral abdominal wall, measuring 6.9 x 3.8 cm on image 64 of series 2.   06/16/2021 Imaging   EXAM: CT ABDOMEN AND PELVIS WITH CONTRAST  IMPRESSION: Increased size of bulky soft tissue masses involving the left psoas muscle and left lateral abdominal wall soft tissues, consistent with progressive metastatic disease.   Significant progression of multiple peritoneal masses throughout the abdomen and pelvis, consistent with peritoneal carcinomatosis.   New mild retroperitoneal lymphadenopathy, consistent with  metastatic disease.   Stable uterine fibroid and left adrenal adenoma.   Cholelithiasis, without evidence of cholecystitis.   06/22/2021 - 08/19/2021 Chemotherapy   Patient is on Treatment Plan : COLORECTAL FOLFOX q14d x 6 months     06/22/2021 Tumor Marker   Patient's tumor was tested for the following markers: CEA. Results of the tumor marker test revealed 87.41.   07/10/2021 Pathology Results   FINAL MICROSCOPIC DIAGNOSIS:   A. PERITONEAL MASS, LEFT UPPER ABDOMINAL QUADRANT, BIOPSY:  -  Metastatic adenocarcinoma with necrosis, histologically consistent with colon primary.     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 The report date is 07/16/2021. The remainder of testing was negative/normal.  The CancerNext-Expanded + RNAinsight gene panel offered by [employer] and includes sequencing and rearrangement analysis for the following 77 genes: IP, ALK, APC*, ATM*, AXIN2, BAP1, BARD1, BLM, BMPR1A, BRCA1*, BRCA2*, BRIP1*, CDC73, CDH1*,CDK4, CDKN1B, CDKN2A, CHEK2*, CTNNA1, DICER1, FANCC, FH, FLCN, GALNT12, KIF1B, LZTR1, MAX, MEN1, MET, MLH1*, [name]2*, [name]3, [name]6*, MUTYH*, NBN, NF1*, NF2, NTHL1, PALB2*, PHOX2B, PMS2*, POT1, PRKAR1A, PTCH1, PTEN*, RAD51C*, RAD51D*,RB1, RECQL, RET, SDHA, SDHAF2, SDHB, SDHC, SDHD, SMAD4, SMARCA4, SMARCB1, SMARCE1, STK11, SUFU, TMEM127, TP53*,TSC1, TSC2, VHL and XRCC2 (sequencing and deletion/duplication); EGFR, EGLN1, HOXB13, KIT, MITF, PDGFRA, POLD1 and POLE (sequencing only); EPCAM and GREM1 (deletion/duplication only).   08/28/2021 Imaging   EXAM: CT CHEST, ABDOMEN, AND PELVIS WITH CONTRAST  IMPRESSION: 1. Continued interval progression of multiple metastatic soft tissue nodules/masses in the abdomen and pelvis. 2. No evidence for metastatic disease in the chest. 3. Similar appearance of the subtle lesion in the L2 vertebral body, suspicious for metastatic involvement. 4.  Cholelithiasis.   09/03/2021 -  Chemotherapy   Patient is on Treatment Plan : COLORECTAL Pembrolizumab (200) q21d     09/08/2021 - 02/01/2022 Chemotherapy   Patient is on Treatment Plan : COLORECTAL Pembrolizumab (200) q21d     01/08/2022 Imaging   CT CAP IMPRESSION: 1. Mixed response to therapy. Interval decrease in size of the conglomerate multilobulated mass extending from the left psoas into the left posterior pararenal space through the left lateral abdominal wall along the tract of the previously placed percutaneous drainage catheter. Interval decrease in size of right adnexal mass and omental masses within the right lower quadrant of the abdomen. Interval development of peritoneal carcinomatosis with omental caking within the left lower quadrant of the abdomen and  left subdiaphragmatic region adjacent to the spleen and along the left pericolic gutter as well as development of ascites appearing loculated within the anterior peritoneum. 2. Interval development of mild left hydronephrosis secondary to stricturing of the mid left ureter. 3. Stable left adrenal metastasis. 4. Stable sclerotic lesion within the L2 vertebral body. No new lytic or blastic bone lesions are seen. 5. Cholelithiasis. 6. No evidence of intrathoracic metastatic disease. 7. 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 Moderate colonic stool burden. No evidence of obstruction.   03/15/2022 Imaging   IMPRESSION: 1. Status post sigmoid colon resection with left lower quadrant end colostomy. 2. Multicystic bilateral ovarian lesions are slightly increased in size. 3. Diminished, small volume of loculated appearing ascites throughout the abdomen and pelvis, with similar appearance of peritoneal thickening and nodularity throughout. 4. Heterogeneously calcified, conglomerate masses in the left retroperitoneum and abdominal wall are slightly diminished in  size. 5. Left adrenal lesion not significantly changed. 6. Unchanged faintly sclerotic metastatic lesion of L2. 7. Constellation of findings is consistent with mixed response to treatment however with overall little interval change. 8. Minimal residual left hydronephrosis and proximal hydroureter, improved compared to prior examination. 9. Cholelithiasis. 10. Coronary artery disease, significantly advanced for patient age.   08/04/2022 Imaging    IMPRESSION: CT CHEST:   1. No developing mass lesion, fluid collection or lymph node enlargement in the thorax. Stable tiny 3 mm lung nodule. 2. Coronary artery calcifications. Please correlate for other coronary risk factors. 3. Chest port   CT ABDOMEN AND PELVIS:   1. Extensive surgical changes identified. Left lower quadrant ostomy, diverting colostomy. Surgical changes along loops of small bowel in the right hemiabdomen. 2. Once again there are areas of nodular tissue extending along the left lateral abdominal wall, into the retroperitoneum which are similar to previous. The amount of adjacent fluid in these locations has improved. 3. No new mass lesion, fluid collection or lymph node enlargement in the abdomen or pelvis. 4. Gallstones. 5. Enlarged uterus with fibroids. Bilateral complex cystic areas are once again seen and based on appearance differential includes dilated fallopian tubes, hydrosalpinx versus true cystic lesions. Please correlate for any known history or prior workup 6. Limited evaluation for solid organ pathology metastatic disease without the advantage of IV contrast     05/04/2023 PET scan   PET - restaging - skull base to thigh  IMPRESSION: 1. Subcentimeter peritoneal nodules in the left abdomen have decreased in size and hypermetabolism from 04/26/2022, compatible with treated metastatic disease. 2. Chronic focal hypermetabolism associated with a gallbladder nodule. Malignancy cannot be excluded. 3. Small  hypermetabolic to borderline hypermetabolic external iliac and left inguinal lymph nodes and minimally hypermetabolic small bilateral cervical lymph nodes, nonspecific. Metastatic disease not excluded. Recommend attention on follow-up. 4. Left adrenal adenoma. 5. Cystic right adnexal mass is inadequately characterized. Because this lesion is not adequately characterized, prompt [place] is recommended for further evaluation. Note: This recommendation does not apply to premenarchal patients and to those with increased risk (genetic, family history, elevated tumor markers or other high-risk factors) of ovarian cancer. Reference: JACR 2020 Feb; 17(2):248-254 6. 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 No developing mass lesion, fluid collection or lymph node enlargement in the thorax. Stable tiny 3 mm lung nodule. 2. Coronary artery calcifications. Please correlate for other coronary risk factors. 3. Chest port   CT ABDOMEN AND PELVIS:   1. Extensive surgical changes identified. Left lower quadrant ostomy, diverting colostomy. Surgical changes along loops of small bowel in the right hemiabdomen. 2. Once again there are areas of nodular tissue extending along the left lateral abdominal wall, into the retroperitoneum which are similar to previous. The amount of adjacent fluid in these locations has improved. 3. No new mass lesion, fluid collection or lymph node enlargement in the abdomen or pelvis. 4. Gallstones. 5. Enlarged uterus with fibroids. Bilateral complex cystic areas are once again seen and based on appearance differential includes dilated fallopian tubes, hydrosalpinx versus true cystic  lesions. Please correlate for any known history or prior workup 6. Limited evaluation for solid organ pathology metastatic disease without the advantage of IV contrast         REVIEW OF SYSTEMS:   Constitutional: Denies fevers, chills or abnormal weight loss. Mild fatigue  Eyes: Denies blurriness of vision Ears, nose, mouth, throat, and face: Denies mucositis or sore throat Respiratory: Denies cough, dyspnea or wheezes Cardiovascular: Denies palpitation, chest discomfort or lower extremity swelling Gastrointestinal:  Denies nausea, heartburn or change in bowel habits Skin: Denies abnormal skin rashes Lymphatics: Denies new lymphadenopathy or easy bruising Neurological:Denies numbness, tingling or new weaknesses Behavioral/Psych: Mood is stable, no new changes  Musculoskeletal: Low back pain improving. All other systems were reviewed with the patient and are negative.   VITALS:   Today's Vitals   05/11/23 1036  BP: 138/82  Pulse: (!) 101  Resp: 17  Temp: (!) 97.5 F (36.4 C)  TempSrc: Temporal  SpO2: 100%  Weight: (!) 318 lb 14.4 oz (144.7 kg)   Body mass index is 56.49 kg/m.    Wt Readings from Last 3 Encounters:  05/11/23 (!) 318 lb 14.4 oz (144.7 kg)  04/20/23 (!) 319 lb 3.2 oz (144.8 kg)  03/30/23 (!) 311 lb 9.6 oz (141.3 kg)    Body mass index is 56.49 kg/m.  Performance status (ECOG): 1 - Symptomatic but completely ambulatory  PHYSICAL EXAM:   GENERAL:alert, no distress and comfortable SKIN: skin color, texture, turgor are normal, no rashes or significant lesions EYES: normal, Conjunctiva are pink and non-injected, sclera clear OROPHARYNX:no exudate, no erythema and lips, buccal mucosa, and tongue normal  NECK: supple, thyroid normal size, non-tender, without nodularity LYMPH:  no palpable lymphadenopathy in the cervical, axillary or inguinal LUNGS: clear to auscultation and percussion with normal breathing effort HEART: regular rate & rhythm and no  murmurs and no lower extremity edema ABDOMEN:abdomen soft, non-tender and normal bowel sounds Musculoskeletal:no cyanosis of digits and no clubbing  NEURO: alert & oriented x 3 with fluent speech, no focal motor/sensory deficits  LABORATORY DATA:  I have reviewed the data as listed    Component Value Date/Time   NA 137 05/11/2023 1000   NA 139 12/21/2019 1024   K 3.7 05/11/2023 1000   CL 107 05/11/2023 1000   CO2 24 05/11/2023 1000   GLUCOSE 90 05/11/2023 1000   BUN 16 05/11/2023 1000   BUN 13 12/21/2019 1024   CREATININE 0.76 05/11/2023 1000   CALCIUM 8.7 (L) 05/11/2023 1000   PROT 7.2 05/11/2023 1000   PROT 7.4 07/11/2019 1638   ALBUMIN 3.9 05/11/2023 1000   ALBUMIN 4.6  07/11/2019 1638   AST 12 (L) 05/11/2023 1000   ALT 9 05/11/2023 1000   ALKPHOS 42 05/11/2023 1000   BILITOT 0.3 05/11/2023 1000   GFRNONAA >60 05/11/2023 1000   GFRAA 96 12/21/2019 1024   Lab Results  Component Value Date   WBC 6.7 05/11/2023   NEUTROABS 4.1 05/11/2023   HGB 10.8 (L) 05/11/2023   HCT 32.9 (L) 05/11/2023   MCV 82.7 05/11/2023   PLT 411 (H) 05/11/2023     RADIOGRAPHIC STUDIES: NM PET Image Restag (PS) Skull Base To Thigh  Result Date: 05/11/2023 CLINICAL DATA:  Subsequent treatment strategy for colon cancer. EXAM: NUCLEAR MEDICINE PET SKULL BASE TO THIGH TECHNIQUE: 16.6 mCi F-18 FDG was injected intravenously. Full-ring PET imaging was performed from the skull base to thigh after the radiotracer. CT data was obtained and used for attenuation correction and anatomic localization. Fasting blood glucose: 63 mg/dl COMPARISON:  CT chest abdomen pelvis 04/13/2023 and PET 04/26/2022. FINDINGS: Mediastinal blood pool activity: SUV max 3.7 Liver activity: SUV max NA NECK: Prominent symmetric bilateral tonsillar uptake, as before. Small bilateral mildly hypermetabolic level II lymph nodes with index lymph node on the right measuring 6 mm, SUV max 4.4. No additional abnormal hypermetabolism.  Incidental CT findings: None. CHEST: No abnormal hypermetabolism. Incidental CT findings: Right IJ Port-A-Cath terminates in the right atrium. Heart is enlarged. No pericardial or pleural effusion. ABDOMEN/PELVIS: Focal hypermetabolism along the anteromedial wall of the gallbladder corresponds to a 1.3 x 1.8 cm nodule better seen on 04/13/2023, SUV max 5.0. Small peritoneal nodules an the left pericolic gutter measure up to 5 mm (4/123) and are visualized on PET, SUV max 1.8. Small external iliac lymph nodes do not show metabolism above blood pool. Borderline hypermetabolic left inguinal lymph node measures 10 mm (4/177), SUV max 3.7, new. Otherwise, no abnormal hypermetabolism. Incidental CT findings: Left adrenal mass measures 3.7 cm and 0 Hounsfield units. No follow-up necessary. Low-attenuation lesion in the lower pole left kidney. No specific follow-up necessary. 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 Enlarged fibroid uterus. Cystic right adnexal mass measures approximately 3.5 x 8.4 cm, as on 04/13/2023. SKELETON: No abnormal hypermetabolism. Incidental CT findings: Minimal degenerative change in the spine. IMPRESSION: 1. Subcentimeter peritoneal nodules in the left abdomen have decreased in size and hypermetabolism from 04/26/2022, compatible with treated metastatic disease. 2. Chronic focal hypermetabolism associated with a gallbladder nodule. Malignancy cannot be excluded. 3. Small hypermetabolic to borderline hypermetabolic external iliac and left inguinal lymph nodes and minimally hypermetabolic small bilateral cervical lymph nodes, nonspecific. Metastatic disease not excluded. Recommend attention on follow-up. 4. Left adrenal adenoma. 5. Cystic right adnexal mass is inadequately characterized. Because this lesion is not adequately characterized, prompt [place] is recommended for further evaluation. Note: This recommendation does not apply to premenarchal patients and  to those with increased risk (genetic, family history, elevated tumor markers or other high-risk factors) of ovarian cancer. Reference: JACR 2020 Feb; 17(2):248-254 6. 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 Electronically Signed   By: Leanna Battles M.D.   On: 05/11/2023 08:48   CT CHEST ABDOMEN PELVIS W CONTRAST  Result Date: 04/20/2023 CLINICAL DATA:  37 year old female with history of colon cancer. Follow-up study to evaluate for response to therapy. * Tracking Code: BO * EXAM: CT CHEST, ABDOMEN, AND PELVIS WITH CONTRAST TECHNIQUE: Multidetector CT imaging of the chest, abdomen and pelvis was performed following the standard protocol during bolus administration of  intravenous contrast. RADIATION DOSE REDUCTION: This exam was performed according to the departmental dose-optimization program which includes automated exposure control, adjustment of the mA and/or kV according to patient size and/or use of iterative reconstruction technique. CONTRAST:  OMNIPAQUE IOHEXOL 300 MG/ML  SOLN COMPARISON:  CT of the chest, abdomen and pelvis 01/24/2023. FINDINGS: CT CHEST FINDINGS Cardiovascular: Heart size is normal. There is no significant pericardial fluid, thickening or pericardial calcification. Right internal jugular single-lumen Port-A-Cath with tip terminating at the superior cavoatrial junction. No atherosclerotic calcifications are noted in the thoracic aorta or the coronary arteries. Mediastinum/Nodes: No pathologically enlarged mediastinal or hilar lymph nodes. Esophagus is unremarkable in appearance. No axillary lymphadenopathy. Lungs/Pleura: No acute consolidative airspace disease. No pleural effusions. No suspicious appearing pulmonary nodules or masses are noted. Musculoskeletal: There are no aggressive appearing lytic or blastic lesions noted in the visualized portions of the skeleton. CT ABDOMEN PELVIS FINDINGS Hepatobiliary: No suspicious cystic or solid  hepatic lesions. No intra or extrahepatic biliary ductal dilatation. Poorly defined intermediate attenuation structure along the anterior wall of the gallbladder (nondependent) on axial image 64 of series 2 estimated to measure approximately 1.8 x 1.3 cm. Tiny partially calcified gallstones lying dependently elsewhere in the gallbladder also noted. Gallbladder is moderately distended. No pericholecystic fluid or surrounding inflammatory changes. Pancreas: No pancreatic mass. No pancreatic ductal dilatation. No pancreatic or peripancreatic fluid collections or inflammatory changes. Spleen: Unremarkable. Adrenals/Urinary Tract: 3.6 x 3.3 cm left adrenal mass, unchanged and previously not hypermetabolic on PET-CT 04/26/2022, compatible with a benign adenoma. Right adrenal gland and right kidney are normal in appearance. In the lower pole of the left kidney (axial image 76 of series 2) there is a 3.9 cm low-attenuation lesion compatible with a simple cyst (Bosniak class 1), no imaging follow-up recommended. No hydroureteronephrosis. Urinary bladder is unremarkable in appearance. Stomach/Bowel: The appearance of the stomach is normal. There is no pathologic dilatation of small bowel or colon. Status post left hemicolectomy with Hartmann's pouch and transverse colostomy in the left side of the abdomen which is associated with a sizable parastomal hernia which contains predominantly fat and a small volume of fluid, similar to the prior examination. Normal appendix. Vascular/Lymphatic: No significant atherosclerotic disease, aneurysm or dissection noted in the abdominal or pelvic vasculature. No definite lymphadenopathy confidently identified in the abdomen or pelvis. Reproductive: Uterus is enlarged and heterogeneous in appearance, including a dominant partially calcified lesion in the region of the fundus on the left measuring up to 5.5 x 3.8 cm, similar to prior studies, presumably an ovarian fibroid. Large  low-attenuation lesion associated with the right adnexa (axial image 89 of series 2) measuring up to 9 x 3.9 cm, similar to prior examinations, potentially a larger ovarian cyst or hydrosalpinx. Left adnexa is grossly unremarkable in appearance. Other: Small volume of ascites, including some fluid associated with the large peristomal hernia. Small high attenuation nodules in the left side of the peritoneal cavity, grossly stable on axial image 69 and 70 of series 2. Adjacent to this there is a new nodule (axial image 70 of series 2), concerning for potential peritoneal implant. No pneumoperitoneum. Musculoskeletal: Partially calcified soft tissue thickening again noted in the retroperitoneum extending from the left quadratus lumborum musculature through the musculature of the left flank outward to the overlying skin surface, best appreciated on axial image 80 of series 2, very similar to prior studies (potentially postoperative scarring, although underlying tumoral seeding of this is not excluded, particularly in light of the hypermetabolism  in this region on prior PET-CT. There are no aggressive appearing lytic or blastic lesions noted in the visualized portions of the skeleton. IMPRESSION: 1. Overall, today's examination appears very similar to the recent prior study from 01/24/2023, although there are subtle findings of concern. Specifically, in the left side of the abdomen (axial image 70 of series 2) there is a 1.4 x 0.8 cm soft tissue attenuation nodule, which could represent a small peritoneal implant. In addition, there is a non dependent polypoid intermediate attenuation lesion associated with the anterior wall of the gallbladder, that could represent a metastatic lesion or primary gallbladder malignancy (or a benign polyp). This could be better evaluated with follow-up MRI of the abdomen with and without IV gadolinium with MRCP. 2. No definite findings to suggest metastatic disease to the thorax. 3. Large  peristomal hernia redemonstrated with increasing volume of fluid associated with the peristomal hernia. This is nonspecific. 4. Large fluid attenuation lesion in the right adnexa again noted. Although similar to the most recent prior study, this has enlarged compared to more remote prior examinations. The possibility of ovarian malignancy should be considered, and further evaluation with pelvic ultrasound is recommended at this time. 5. Fibroid uterus redemonstrated. 6. Large left adrenal adenoma again noted (no imaging follow-up recommended). 7. Additional incidental findings, similar to prior studies, as above. Electronically Signed   By: Trudie Reed M.D.   On: 04/20/2023 08:10    Addendum I have seen the patient, examined her. I agree with the assessment and and plan and have edited the notes.   Ms. Ellisor is clinically doing well and is stable, no new complaints.  I personally reviewed her restaging PET scan, which showed decreased hypermetabolic of peritoneal nodules, and a small hypermetabolic gallbladder nodule, indeterminate.  The right ovarian cyst is not hypermetabolic on PET, will ask her gynecologist to review also.  No definitive evidence for cancer progression on her PET scan.  Will continue Keytruda.  All questions were answered.  I spent a total of 25 minutes for her visit today, more than 50% time on face-to-face counseling.  Malachy Mood MD 05/11/2023

## 2023-05-11 ENCOUNTER — Encounter: Payer: Self-pay | Admitting: Nurse Practitioner

## 2023-05-11 ENCOUNTER — Inpatient Hospital Stay (HOSPITAL_BASED_OUTPATIENT_CLINIC_OR_DEPARTMENT_OTHER): Payer: Managed Care, Other (non HMO) | Admitting: Nurse Practitioner

## 2023-05-11 ENCOUNTER — Inpatient Hospital Stay: Payer: Managed Care, Other (non HMO)

## 2023-05-11 ENCOUNTER — Inpatient Hospital Stay: Payer: Managed Care, Other (non HMO) | Attending: Hematology

## 2023-05-11 ENCOUNTER — Encounter: Payer: Self-pay | Admitting: Hematology

## 2023-05-11 VITALS — BP 124/81 | HR 83 | Resp 16

## 2023-05-11 VITALS — BP 138/82 | HR 101 | Temp 97.5°F | Resp 17 | Wt 318.9 lb

## 2023-05-11 DIAGNOSIS — C187 Malignant neoplasm of sigmoid colon: Secondary | ICD-10-CM

## 2023-05-11 DIAGNOSIS — J90 Pleural effusion, not elsewhere classified: Secondary | ICD-10-CM | POA: Diagnosis not present

## 2023-05-11 DIAGNOSIS — Z933 Colostomy status: Secondary | ICD-10-CM | POA: Insufficient documentation

## 2023-05-11 DIAGNOSIS — C7951 Secondary malignant neoplasm of bone: Secondary | ICD-10-CM | POA: Diagnosis not present

## 2023-05-11 DIAGNOSIS — Z5112 Encounter for antineoplastic immunotherapy: Secondary | ICD-10-CM | POA: Insufficient documentation

## 2023-05-11 DIAGNOSIS — I2510 Atherosclerotic heart disease of native coronary artery without angina pectoris: Secondary | ICD-10-CM | POA: Diagnosis not present

## 2023-05-11 DIAGNOSIS — C786 Secondary malignant neoplasm of retroperitoneum and peritoneum: Secondary | ICD-10-CM

## 2023-05-11 DIAGNOSIS — K8020 Calculus of gallbladder without cholecystitis without obstruction: Secondary | ICD-10-CM | POA: Insufficient documentation

## 2023-05-11 DIAGNOSIS — M2550 Pain in unspecified joint: Secondary | ICD-10-CM | POA: Insufficient documentation

## 2023-05-11 DIAGNOSIS — Z79899 Other long term (current) drug therapy: Secondary | ICD-10-CM | POA: Insufficient documentation

## 2023-05-11 DIAGNOSIS — Z452 Encounter for adjustment and management of vascular access device: Secondary | ICD-10-CM

## 2023-05-11 DIAGNOSIS — K760 Fatty (change of) liver, not elsewhere classified: Secondary | ICD-10-CM | POA: Insufficient documentation

## 2023-05-11 DIAGNOSIS — K435 Parastomal hernia without obstruction or  gangrene: Secondary | ICD-10-CM | POA: Insufficient documentation

## 2023-05-11 DIAGNOSIS — D3502 Benign neoplasm of left adrenal gland: Secondary | ICD-10-CM | POA: Diagnosis not present

## 2023-05-11 DIAGNOSIS — R911 Solitary pulmonary nodule: Secondary | ICD-10-CM | POA: Diagnosis not present

## 2023-05-11 DIAGNOSIS — D259 Leiomyoma of uterus, unspecified: Secondary | ICD-10-CM | POA: Insufficient documentation

## 2023-05-11 DIAGNOSIS — D500 Iron deficiency anemia secondary to blood loss (chronic): Secondary | ICD-10-CM

## 2023-05-11 LAB — PREGNANCY, URINE: Preg Test, Ur: NEGATIVE

## 2023-05-11 LAB — CBC WITH DIFFERENTIAL (CANCER CENTER ONLY)
Abs Immature Granulocytes: 0.03 10*3/uL (ref 0.00–0.07)
Basophils Absolute: 0 10*3/uL (ref 0.0–0.1)
Basophils Relative: 0 %
Eosinophils Absolute: 0.1 10*3/uL (ref 0.0–0.5)
Eosinophils Relative: 2 %
HCT: 32.9 % — ABNORMAL LOW (ref 36.0–46.0)
Hemoglobin: 10.8 g/dL — ABNORMAL LOW (ref 12.0–15.0)
Immature Granulocytes: 0 %
Lymphocytes Relative: 30 %
Lymphs Abs: 2 10*3/uL (ref 0.7–4.0)
MCH: 27.1 pg (ref 26.0–34.0)
MCHC: 32.8 g/dL (ref 30.0–36.0)
MCV: 82.7 fL (ref 80.0–100.0)
Monocytes Absolute: 0.5 10*3/uL (ref 0.1–1.0)
Monocytes Relative: 7 %
Neutro Abs: 4.1 10*3/uL (ref 1.7–7.7)
Neutrophils Relative %: 61 %
Platelet Count: 411 10*3/uL — ABNORMAL HIGH (ref 150–400)
RBC: 3.98 MIL/uL (ref 3.87–5.11)
RDW: 14.7 % (ref 11.5–15.5)
WBC Count: 6.7 10*3/uL (ref 4.0–10.5)
nRBC: 0 % (ref 0.0–0.2)

## 2023-05-11 LAB — CMP (CANCER CENTER ONLY)
ALT: 9 U/L (ref 0–44)
AST: 12 U/L — ABNORMAL LOW (ref 15–41)
Albumin: 3.9 g/dL (ref 3.5–5.0)
Alkaline Phosphatase: 42 U/L (ref 38–126)
Anion gap: 6 (ref 5–15)
BUN: 16 mg/dL (ref 6–20)
CO2: 24 mmol/L (ref 22–32)
Calcium: 8.7 mg/dL — ABNORMAL LOW (ref 8.9–10.3)
Chloride: 107 mmol/L (ref 98–111)
Creatinine: 0.76 mg/dL (ref 0.44–1.00)
GFR, Estimated: >60 mL/min (ref >60)
Glucose, Bld: 90 mg/dL (ref 70–99)
Potassium: 3.7 mmol/L (ref 3.5–5.1)
Sodium: 137 mmol/L (ref 135–145)
Total Bilirubin: 0.3 mg/dL (ref <1.2)
Total Protein: 7.2 g/dL (ref 6.5–8.1)

## 2023-05-11 LAB — CEA (IN HOUSE-CHCC): CEA (CHCC-In House): <1 ng/mL (ref 0.00–5.00)

## 2023-05-11 LAB — FERRITIN: Ferritin: 17 ng/mL (ref 11–307)

## 2023-05-11 MED ORDER — SODIUM CHLORIDE 0.9% FLUSH
10.0000 mL | INTRAVENOUS | Status: DC | PRN
  Administered 2023-05-11: 10 mL

## 2023-05-11 MED ORDER — SODIUM CHLORIDE 0.9 % IV SOLN: Freq: Once | INTRAVENOUS | Status: AC

## 2023-05-11 MED ORDER — LIDOCAINE-PRILOCAINE 2.5-2.5 % EX CREA: 1.0000 | TOPICAL_CREAM | CUTANEOUS | 3 refills | Status: DC | PRN

## 2023-05-11 MED ORDER — SODIUM CHLORIDE 0.9 % IV SOLN
200.0000 mg | Freq: Once | INTRAVENOUS | Status: AC
  Administered 2023-05-11: 200 mg via INTRAVENOUS
  Filled 2023-05-11: qty 200

## 2023-05-11 MED ORDER — SODIUM CHLORIDE 0.9% FLUSH
10.0000 mL | Freq: Once | INTRAVENOUS | Status: AC
  Administered 2023-05-11: 10 mL

## 2023-05-11 MED ORDER — HEPARIN SOD (PORK) LOCK FLUSH 100 UNIT/ML IV SOLN
500.0000 [IU] | Freq: Once | INTRAVENOUS | Status: AC | PRN
  Administered 2023-05-11: 500 [IU]

## 2023-05-11 NOTE — Patient Instructions (Signed)

## 2023-05-17 ENCOUNTER — Inpatient Hospital Stay: Payer: Managed Care, Other (non HMO) | Admitting: Licensed Clinical Social Worker

## 2023-05-17 DIAGNOSIS — C187 Malignant neoplasm of sigmoid colon: Secondary | ICD-10-CM

## 2023-05-17 NOTE — Progress Notes (Signed)
[facility] CSW Progress Note  Visual merchandiser  met w/ pt's spouse.  Per spouse pt dropped off paperwork from [facility] to be completed by the medical provider confirming pt's diagnosis.  It is unclear where the paperwork is at this time.  CSW provided pt's husband w/ contact information and email for CSW instructing him to have his wife email the paperwork.  CSW to have the paperwork completed by the appropriate person once received and will return to pt.        Rachel Moulds, LCSW Clinical Social Worker [facility]    Patient is participating in a Managed Medicaid Plan:  Yes

## 2023-05-18 ENCOUNTER — Other Ambulatory Visit: Payer: Self-pay

## 2023-05-23 ENCOUNTER — Encounter: Payer: Self-pay | Admitting: Hematology

## 2023-06-09 ENCOUNTER — Inpatient Hospital Stay: Payer: Managed Care, Other (non HMO)

## 2023-06-09 ENCOUNTER — Inpatient Hospital Stay: Payer: Managed Care, Other (non HMO) | Attending: Hematology

## 2023-06-09 VITALS — BP 157/93 | HR 74 | Temp 98.2°F | Resp 16 | Wt 327.0 lb

## 2023-06-09 DIAGNOSIS — K760 Fatty (change of) liver, not elsewhere classified: Secondary | ICD-10-CM | POA: Insufficient documentation

## 2023-06-09 DIAGNOSIS — M62830 Muscle spasm of back: Secondary | ICD-10-CM | POA: Diagnosis not present

## 2023-06-09 DIAGNOSIS — D3502 Benign neoplasm of left adrenal gland: Secondary | ICD-10-CM | POA: Diagnosis not present

## 2023-06-09 DIAGNOSIS — Z801 Family history of malignant neoplasm of trachea, bronchus and lung: Secondary | ICD-10-CM | POA: Insufficient documentation

## 2023-06-09 DIAGNOSIS — C187 Malignant neoplasm of sigmoid colon: Secondary | ICD-10-CM | POA: Diagnosis present

## 2023-06-09 DIAGNOSIS — I2510 Atherosclerotic heart disease of native coronary artery without angina pectoris: Secondary | ICD-10-CM | POA: Insufficient documentation

## 2023-06-09 DIAGNOSIS — D649 Anemia, unspecified: Secondary | ICD-10-CM | POA: Insufficient documentation

## 2023-06-09 DIAGNOSIS — I10 Essential (primary) hypertension: Secondary | ICD-10-CM | POA: Diagnosis not present

## 2023-06-09 DIAGNOSIS — K8020 Calculus of gallbladder without cholecystitis without obstruction: Secondary | ICD-10-CM | POA: Diagnosis not present

## 2023-06-09 DIAGNOSIS — C7972 Secondary malignant neoplasm of left adrenal gland: Secondary | ICD-10-CM | POA: Diagnosis not present

## 2023-06-09 DIAGNOSIS — J90 Pleural effusion, not elsewhere classified: Secondary | ICD-10-CM | POA: Diagnosis not present

## 2023-06-09 DIAGNOSIS — R911 Solitary pulmonary nodule: Secondary | ICD-10-CM | POA: Insufficient documentation

## 2023-06-09 DIAGNOSIS — C786 Secondary malignant neoplasm of retroperitoneum and peritoneum: Secondary | ICD-10-CM | POA: Insufficient documentation

## 2023-06-09 DIAGNOSIS — D259 Leiomyoma of uterus, unspecified: Secondary | ICD-10-CM | POA: Diagnosis not present

## 2023-06-09 DIAGNOSIS — Z803 Family history of malignant neoplasm of breast: Secondary | ICD-10-CM | POA: Diagnosis not present

## 2023-06-09 DIAGNOSIS — C7951 Secondary malignant neoplasm of bone: Secondary | ICD-10-CM | POA: Insufficient documentation

## 2023-06-09 DIAGNOSIS — Z452 Encounter for adjustment and management of vascular access device: Secondary | ICD-10-CM

## 2023-06-09 DIAGNOSIS — Z79899 Other long term (current) drug therapy: Secondary | ICD-10-CM | POA: Insufficient documentation

## 2023-06-09 DIAGNOSIS — M2550 Pain in unspecified joint: Secondary | ICD-10-CM | POA: Diagnosis not present

## 2023-06-09 LAB — CBC WITH DIFFERENTIAL (CANCER CENTER ONLY)
Abs Immature Granulocytes: 0.02 10*3/uL (ref 0.00–0.07)
Basophils Absolute: 0 10*3/uL (ref 0.0–0.1)
Basophils Relative: 0 %
Eosinophils Absolute: 0.1 10*3/uL (ref 0.0–0.5)
Eosinophils Relative: 2 %
HCT: 33 % — ABNORMAL LOW (ref 36.0–46.0)
Hemoglobin: 10.6 g/dL — ABNORMAL LOW (ref 12.0–15.0)
Immature Granulocytes: 0 %
Lymphocytes Relative: 28 %
Lymphs Abs: 1.9 10*3/uL (ref 0.7–4.0)
MCH: 26.4 pg (ref 26.0–34.0)
MCHC: 32.1 g/dL (ref 30.0–36.0)
MCV: 82.1 fL (ref 80.0–100.0)
Monocytes Absolute: 0.4 10*3/uL (ref 0.1–1.0)
Monocytes Relative: 6 %
Neutro Abs: 4.3 10*3/uL (ref 1.7–7.7)
Neutrophils Relative %: 64 %
Platelet Count: 419 10*3/uL — ABNORMAL HIGH (ref 150–400)
RBC: 4.02 MIL/uL (ref 3.87–5.11)
RDW: 14.6 % (ref 11.5–15.5)
WBC Count: 6.8 10*3/uL (ref 4.0–10.5)
nRBC: 0 % (ref 0.0–0.2)

## 2023-06-09 LAB — CMP (CANCER CENTER ONLY)
ALT: 10 U/L (ref 0–44)
AST: 12 U/L — ABNORMAL LOW (ref 15–41)
Albumin: 3.8 g/dL (ref 3.5–5.0)
Alkaline Phosphatase: 44 U/L (ref 38–126)
Anion gap: 4 — ABNORMAL LOW (ref 5–15)
BUN: 15 mg/dL (ref 6–20)
CO2: 25 mmol/L (ref 22–32)
Calcium: 8.6 mg/dL — ABNORMAL LOW (ref 8.9–10.3)
Chloride: 110 mmol/L (ref 98–111)
Creatinine: 0.71 mg/dL (ref 0.44–1.00)
GFR, Estimated: >60 mL/min (ref >60)
Glucose, Bld: 92 mg/dL (ref 70–99)
Potassium: 4.2 mmol/L (ref 3.5–5.1)
Sodium: 139 mmol/L (ref 135–145)
Total Bilirubin: 0.2 mg/dL (ref 0.0–1.2)
Total Protein: 7.1 g/dL (ref 6.5–8.1)

## 2023-06-09 LAB — PREGNANCY, URINE: Preg Test, Ur: NEGATIVE

## 2023-06-09 MED ORDER — SODIUM CHLORIDE 0.9 % IV SOLN
200.0000 mg | Freq: Once | INTRAVENOUS | Status: AC
  Administered 2023-06-09: 200 mg via INTRAVENOUS
  Filled 2023-06-09: qty 8

## 2023-06-09 MED ORDER — PEMBROLIZUMAB CHEMO INJECTION 100 MG/4ML
200.0000 mg | Freq: Once | INTRAVENOUS | Status: DC
  Filled 2023-06-09: qty 8

## 2023-06-09 MED ORDER — SODIUM CHLORIDE 0.9 % IV SOLN: Freq: Once | INTRAVENOUS | Status: AC

## 2023-06-09 MED ORDER — HEPARIN SOD (PORK) LOCK FLUSH 100 UNIT/ML IV SOLN
500.0000 [IU] | Freq: Once | INTRAVENOUS | Status: AC | PRN
  Administered 2023-06-09: 500 [IU]

## 2023-06-09 MED ORDER — CLONIDINE HCL 0.1 MG PO TABS
0.1000 mg | ORAL_TABLET | Freq: Once | ORAL | Status: AC
Start: 2023-06-09 — End: 2023-06-09
  Administered 2023-06-09: 0.1 mg via ORAL
  Filled 2023-06-09: qty 1

## 2023-06-09 MED ORDER — SODIUM CHLORIDE 0.9% FLUSH
10.0000 mL | Freq: Once | INTRAVENOUS | Status: AC
  Administered 2023-06-09: 10 mL

## 2023-06-09 MED ORDER — SODIUM CHLORIDE 0.9% FLUSH
10.0000 mL | INTRAVENOUS | Status: DC | PRN
  Administered 2023-06-09: 10 mL

## 2023-06-09 NOTE — Progress Notes (Signed)
 Patient blood pressure is high 160-181/110-117 after multiple checks. Given 0.1mg  Po clonidine per MD order and rechecking the blood pressure. Ok to treat per Dr. Mosetta Putt.  BP recheck 157/93. MD aware.

## 2023-06-09 NOTE — Progress Notes (Signed)
PICC line placed today.

## 2023-06-09 NOTE — Patient Instructions (Signed)

## 2023-06-10 LAB — T4: T4, Total: 6.8 ug/dL (ref 4.5–12.0)

## 2023-06-10 LAB — TSH: TSH: 1.137 u[IU]/mL (ref 0.350–4.500)

## 2023-06-14 ENCOUNTER — Ambulatory Visit: Payer: Managed Care, Other (non HMO) | Admitting: Internal Medicine

## 2023-06-14 ENCOUNTER — Encounter: Payer: Self-pay | Admitting: Internal Medicine

## 2023-06-14 VITALS — BP 150/98 | HR 86 | Temp 98.4°F | Ht 63.0 in | Wt 328.6 lb

## 2023-06-14 DIAGNOSIS — Z6841 Body Mass Index (BMI) 40.0 and over, adult: Secondary | ICD-10-CM

## 2023-06-14 DIAGNOSIS — I10 Essential (primary) hypertension: Secondary | ICD-10-CM

## 2023-06-14 DIAGNOSIS — R5381 Other malaise: Secondary | ICD-10-CM

## 2023-06-14 DIAGNOSIS — D649 Anemia, unspecified: Secondary | ICD-10-CM

## 2023-06-14 DIAGNOSIS — N62 Hypertrophy of breast: Secondary | ICD-10-CM

## 2023-06-14 DIAGNOSIS — C187 Malignant neoplasm of sigmoid colon: Secondary | ICD-10-CM

## 2023-06-14 DIAGNOSIS — M546 Pain in thoracic spine: Secondary | ICD-10-CM

## 2023-06-14 DIAGNOSIS — G8929 Other chronic pain: Secondary | ICD-10-CM | POA: Diagnosis not present

## 2023-06-14 DIAGNOSIS — M791 Myalgia, unspecified site: Secondary | ICD-10-CM

## 2023-06-14 DIAGNOSIS — E66813 Obesity, class 3: Secondary | ICD-10-CM

## 2023-06-14 MED ORDER — VALSARTAN 80 MG PO TABS: 80.0000 mg | ORAL_TABLET | Freq: Every day | ORAL | 11 refills | Status: DC

## 2023-06-14 MED ORDER — METOPROLOL TARTRATE 75 MG PO TABS: 75.0000 mg | ORAL_TABLET | Freq: Two times a day (BID) | ORAL | 3 refills | Status: DC

## 2023-06-14 MED ORDER — TRIAMCINOLONE ACETONIDE 0.1 % EX CREA: TOPICAL_CREAM | CUTANEOUS | 0 refills | Status: AC

## 2023-06-14 MED ORDER — GABAPENTIN 300 MG PO CAPS: 300.0000 mg | ORAL_CAPSULE | Freq: Every day | ORAL | 1 refills | Status: DC

## 2023-06-14 NOTE — Progress Notes (Signed)
 I,Victoria T Emmitt, CMA,acting as a neurosurgeon for Catheryn LOISE Slocumb, MD.,have documented all relevant documentation on the behalf of Catheryn LOISE Slocumb, MD,as directed by  Catheryn LOISE Slocumb, MD while in the presence of Catheryn LOISE Slocumb, MD.  Subjective:  Patient ID: Mikayla Rios , female    DOB: 09-Jul-1985 , 38 y.o.   MRN: [id number]  Chief Complaint  Patient presents with   Hypertension    HPI  Patient presents today for a BP check.  She is accompanied by her husband, Lela today. She denies chest pain & sob. She admits having a off & on headache on 1/02 & the two days after chemo. She was also given one dose of Clonidine  0.1MG  before chemo to help bring down bp. She has noticed that her BP has been elevated at recent MD visits.      Hypertension This is a chronic problem. The current episode started more than 1 year ago. The problem has been gradually improving since onset. The problem is controlled. Risk factors for coronary artery disease include obesity and sedentary lifestyle. Past treatments include beta blockers. The current treatment provides mild improvement.     Past Medical History:  Diagnosis Date   Allergy    seasonal   Anemia    Blood infection 1985   Blood transfusion without reported diagnosis    colon ca 04/2021   Diverticulitis    Family history of breast cancer    Family history of colon cancer    Family history of colon cancer    Family history of stomach cancer    Hypertension    Obesity    Sleep apnea      Family History  Problem Relation Age of Onset   Diabetes Mother    Hypertension Mother    Colon cancer Mother        dx at age 72   Breast cancer Mother    Congestive Heart Failure Father    Hypertension Father    Diabetes Maternal Grandmother    Hypertension Maternal Grandmother    Diabetes Maternal Grandfather    Kidney disease Maternal Grandfather    Diabetes Paternal Grandmother    Autism Son    Stomach cancer Maternal Uncle    Heart disease  Paternal Aunt    Colon polyps Half-Sister        colectomy   Ovarian cancer Neg Hx    Endometrial cancer Neg Hx    Pancreatic cancer Neg Hx    Prostate cancer Neg Hx      Current Outpatient Medications:    ALPRAZolam  (XANAX ) 0.25 MG tablet, Take 1 tablet (0.25 mg total) by mouth at bedtime as needed for anxiety., Disp: 30 tablet, Rfl: 0   amLODipine  (NORVASC ) 10 MG tablet, TAKE 1 TABLET BY MOUTH EVERY DAY, Disp: 90 tablet, Rfl: 3   furosemide  (LASIX ) 20 MG tablet, TAKE 1 TABLET BY MOUTH EVERY DAY AS NEEDED, Disp: 90 tablet, Rfl: 1   KLOR-CON  M20 20 MEQ tablet, TAKE 1 TABLET BY MOUTH EVERY DAY, Disp: 90 tablet, Rfl: 1   lidocaine -prilocaine  (EMLA ) cream, Apply 1 Application topically as needed., Disp: 30 g, Rfl: 3   methocarbamol  (ROBAXIN -750) 750 MG tablet, Take 1 tablet (750 mg total) by mouth every 8 (eight) hours as needed for muscle spasms., Disp: 30 tablet, Rfl: 0   POTASSIUM PO, Take by mouth., Disp: , Rfl:    prochlorperazine  (COMPAZINE ) 10 MG tablet, Take 1 tablet (10 mg total) by mouth every 8 (  eight) hours as needed for nausea or vomiting., Disp: 30 tablet, Rfl: 0   sulfamethoxazole-trimethoprim (BACTRIM DS) 800-160 MG tablet, , Disp: , Rfl:    traZODone  (DESYREL ) 50 MG tablet, TAKE 1-2 TABLETS BY MOUTH AT BEDTIME AS NEEDED FOR SLEEP., Disp: 180 tablet, Rfl: 1   triamcinolone  cream (KENALOG ) 0.1 %, APPLY TO AFFECTED AREA TWICE DAILY AS NEEDED, Disp: 30 g, Rfl: 0   valsartan  (DIOVAN ) 80 MG tablet, Take 1 tablet (80 mg total) by mouth daily., Disp: 30 tablet, Rfl: 11   gabapentin  (NEURONTIN ) 300 MG capsule, Take 1 capsule (300 mg total) by mouth at bedtime., Disp: 30 capsule, Rfl: 1   Metoprolol  Tartrate 75 MG TABS, Take 1 tablet (75 mg total) by mouth 2 (two) times daily., Disp: 180 tablet, Rfl: 3 No current facility-administered medications for this visit.  Facility-Administered Medications Ordered in Other Visits:    fentaNYL  (SUBLIMAZE ) injection, , Intravenous, PRN, Mir,  Farhaan R, MD, 50 mcg at 10/02/21 1436   midazolam  (VERSED ) injection, , Intravenous, PRN, Mir, Farhaan R, MD, 1 mg at 10/02/21 1436   Allergies  Allergen Reactions   Chlorhexidine  Gluconate Hives and Itching    CHG wipes cause itching and hives requiring benadryl    Shellfish Allergy Hives   Penicillins Hives and Rash    Has patient had a PCN reaction causing immediate rash, facial/tongue/throat swelling, SOB or lightheadedness with hypotension: No Has patient had a PCN reaction causing severe rash involving mucus membranes or skin necrosis: hives Has patient had a PCN reaction that required hospitalization No Has patient had a PCN reaction occurring within the last 10 years: yes If all of the above answers are NO, then may proceed with Cephalosporin use.      Review of Systems  Constitutional: Negative.   Respiratory: Negative.    Cardiovascular: Negative.   Gastrointestinal: Negative.   Musculoskeletal:  Positive for back pain.  Neurological:  Positive for numbness.  Psychiatric/Behavioral: Negative.       Today's Vitals   06/14/23 1544 06/14/23 1617  BP: (!) 160/100 (!) 150/98  Pulse: 86   Temp: 98.4 F (36.9 C)   SpO2: 98%   Weight: (!) 328 lb 9.6 oz (149.1 kg)   Height: 5' 3 (1.6 m)    Body mass index is 58.21 kg/m.  Wt Readings from Last 3 Encounters:  06/14/23 (!) 328 lb 9.6 oz (149.1 kg)  06/09/23 (!) 327 lb (148.3 kg)  05/11/23 (!) 318 lb 14.4 oz (144.7 kg)   BP Readings from Last 3 Encounters:  06/14/23 (!) 150/98  06/09/23 (!) 157/93  05/11/23 124/81       Objective:  Physical Exam Vitals and nursing note reviewed.  Constitutional:      Appearance: Normal appearance. She is obese.  HENT:     Head: Normocephalic and atraumatic.  Eyes:     Extraocular Movements: Extraocular movements intact.  Cardiovascular:     Rate and Rhythm: Normal rate and regular rhythm.     Heart sounds: Normal heart sounds.  Pulmonary:     Effort: Pulmonary effort  is normal.     Breath sounds: Normal breath sounds.  Musculoskeletal:     Cervical back: Normal range of motion.  Skin:    General: Skin is warm.  Neurological:     General: No focal deficit present.     Mental Status: She is alert.  Psychiatric:        Mood and Affect: Mood normal.  Behavior: Behavior normal.         Assessment And Plan:  Essential hypertension Assessment & Plan: Uncontrolled, start valsartan  80mg  daily. she agrees to RTO in two weeks for a nurse visit. I will check a BMP at that time. Labs reviewed from Onc visit. She is reminded to follow a low sodium diet. She is reminded that breads, cheeses and processed meats can be high in sodium.    Chronic midline thoracic back pain Assessment & Plan: Her sx are likely exacerbated by macromastia. She has tried Salonpas lidocaine  patches without significant relief of her sx. I will send rx gabapentin  300mg  to use nightly. May need PT if this is not helpful.    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 She is encouraged to consider getting measured at [facility]. She does not wish to consider b/l breast reduction at this time.    Malignant neoplasm of sigmoid colon [facility]) Assessment & Plan: She is followed by Onc, their input is appreciated. She is currently on Keytruda . Per Oncology, her CEA has normalized while on this treatment.    Class 3 severe obesity due to excess calories with serious comorbidity and body mass index (BMI) of 50.0 to 59.9 in adult [facility]) Assessment & Plan: BMI 58. She is aware of recent weight gain. Encouraged to gradually increase her daily activity and to remove processed foods from her diet. She is also encouraged to avoid sugary beverages, including diet drinks.   Other orders -     Gabapentin ; Take 1 capsule (300 mg total) by mouth at bedtime.  Dispense: 30 capsule; Refill: 1 -     Metoprolol  Tartrate; Take 1 tablet (75 mg  total) by mouth 2 (two) times daily.  Dispense: 180 tablet; Refill: 3 -     Valsartan ; Take 1 tablet (80 mg total) by mouth daily.  Dispense: 30 tablet; Refill: 11 -     Triamcinolone  Acetonide; APPLY TO AFFECTED AREA TWICE DAILY AS NEEDED  Dispense: 30 g; Refill: 0  She is encouraged to strive for BMI less than 30 to decrease cardiac risk. Advised to aim for at least 150 minutes of exercise per week.    Return in 6 weeks (on 07/26/2023), or f/u gabapentin , for 2 weeks, bp check NV.  Patient was given opportunity to ask questions. Patient verbalized understanding of the plan and was able to repeat key elements of the plan. All questions were answered to their satisfaction.    I, Catheryn LOISE Slocumb, MD, have reviewed all documentation for this visit. The documentation on 06/14/23 for the exam, diagnosis, procedures, and orders are all accurate and complete.   IF YOU HAVE BEEN REFERRED TO A SPECIALIST, IT MAY TAKE 1-2 WEEKS TO SCHEDULE/PROCESS THE REFERRAL. IF YOU HAVE NOT HEARD FROM US /SPECIALIST IN TWO WEEKS, PLEASE GIVE US  A CALL AT [phone].   THE PATIENT IS ENCOURAGED TO PRACTICE SOCIAL DISTANCING DUE TO THE COVID-19 PANDEMIC.

## 2023-06-14 NOTE — Patient Instructions (Signed)
 Hypertension, Adult Hypertension is another name for high blood pressure. High blood pressure forces your heart to work harder to pump blood. This can cause problems over time. There are two numbers in a blood pressure reading. There is a top number (systolic) over a bottom number (diastolic). It is best to have a blood pressure that is below 120/80. What are the causes? The cause of this condition is not known. Some other conditions can lead to high blood pressure. What increases the risk? Some lifestyle factors can make you more likely to develop high blood pressure: Smoking. Not getting enough exercise or physical activity. Being overweight. Having too much fat, sugar, calories, or salt (sodium) in your diet. Drinking too much alcohol. Other risk factors include: Having any of these conditions: Heart disease. Diabetes. High cholesterol. Kidney disease. Obstructive sleep apnea. Having a family history of high blood pressure and high cholesterol. Age. The risk increases with age. Stress. What are the signs or symptoms? High blood pressure may not cause symptoms. Very high blood pressure (hypertensive crisis) may cause: Headache. Fast or uneven heartbeats (palpitations). Shortness of breath. Nosebleed. Vomiting or feeling like you may vomit (nauseous). Changes in how you see. Very bad chest pain. Feeling dizzy. Seizures. How is this treated? This condition is treated by making healthy lifestyle changes, such as: Eating healthy foods. Exercising more. Drinking less alcohol. Your doctor may prescribe medicine if lifestyle changes do not help enough and if: Your top number is above 130. Your bottom number is above 80. Your personal target blood pressure may vary. Follow these instructions at home: Eating and drinking  If told, follow the DASH eating plan. To follow this plan: Fill one half of your plate at each meal with fruits and vegetables. Fill one fourth of your plate  at each meal with whole grains. Whole grains include whole-wheat pasta, brown rice, and whole-grain bread. Eat or drink low-fat dairy products, such as skim milk or low-fat yogurt. Fill one fourth of your plate at each meal with low-fat (lean) proteins. Low-fat proteins include fish, chicken without skin, eggs, beans, and tofu. Avoid fatty meat, cured and processed meat, or chicken with skin. Avoid pre-made or processed food. Limit the amount of salt in your diet to less than 1,500 mg each day. Do not drink alcohol if: Your doctor tells you not to drink. You are pregnant, may be pregnant, or are planning to become pregnant. If you drink alcohol: Limit how much you have to: 0-1 drink a day for women. 0-2 drinks a day for men. Know how much alcohol is in your drink. In the [place] one drink equals one 12 oz bottle of beer (355 mL), one 5 oz glass of wine (148 mL), or one 1 oz glass of hard liquor (44 mL). Lifestyle  Work with your doctor to stay at a healthy weight or to lose weight. Ask your doctor what the best weight is for you. Get at least 30 minutes of exercise that causes your heart to beat faster (aerobic exercise) most days of the week. This may include walking, swimming, or biking. Get at least 30 minutes of exercise that strengthens your muscles (resistance exercise) at least 3 days a week. This may include lifting weights or doing Pilates. Do not smoke or use any products that contain nicotine or tobacco. If you need help quitting, ask your doctor. Check your blood pressure at home as told by your doctor. Keep all follow-up visits. Medicines Take over-the-counter and prescription medicines  only as told by your doctor. Follow directions carefully. Do not skip doses of blood pressure medicine. The medicine does not work as well if you skip doses. Skipping doses also puts you at risk for problems. Ask your doctor about side effects or reactions to medicines that you should watch  for. Contact a doctor if: You think you are having a reaction to the medicine you are taking. You have headaches that keep coming back. You feel dizzy. You have swelling in your ankles. You have trouble with your vision. Get help right away if: You get a very bad headache. You start to feel mixed up (confused). You feel weak or numb. You feel faint. You have very bad pain in your: Chest. Belly (abdomen). You vomit more than once. You have trouble breathing. These symptoms may be an emergency. Get help right away. Call 911. Do not wait to see if the symptoms will go away. Do not drive yourself to the hospital. Summary Hypertension is another name for high blood pressure. High blood pressure forces your heart to work harder to pump blood. For most people, a normal blood pressure is less than 120/80. Making healthy choices can help lower blood pressure. If your blood pressure does not get lower with healthy choices, you may need to take medicine. This information is not intended to replace advice given to you by your health care provider. Make sure you discuss any questions you have with your health care provider. Document Revised: 03/12/2021 Document Reviewed: 03/12/2021 Elsevier Patient Education  2024 [employer].

## 2023-06-26 NOTE — Assessment & Plan Note (Signed)
She is followed by Onc, their input is appreciated. She is currently on Keytruda. Per Oncology, her CEA has normalized while on this treatment.

## 2023-06-26 NOTE — Assessment & Plan Note (Addendum)
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 She has tried Salonpas lidocaine patches without significant relief of her sx. I will send rx gabapentin 300mg  to use nightly. May need PT if this is not helpful.

## 2023-06-26 NOTE — Assessment & Plan Note (Addendum)
Uncontrolled, start valsartan 80mg  daily. she agrees to RTO in two weeks for a nurse visit. I will check a BMP at that time. Labs reviewed from Onc visit. She is reminded to follow a low sodium diet. She is reminded that breads, cheeses and processed meats can be high in sodium.

## 2023-06-26 NOTE — Assessment & Plan Note (Signed)
BMI 58. She is aware of recent weight gain. Encouraged to gradually increase her daily activity and to remove processed foods from her diet. She is also encouraged to avoid sugary beverages, including "diet" drinks.

## 2023-06-26 NOTE — Assessment & Plan Note (Signed)
At her last measurement, she may have been 44DDD. She is encouraged to consider getting measured at [facility]. She does not wish to consider b/l breast reduction at this time.

## 2023-06-28 ENCOUNTER — Encounter: Payer: Self-pay | Admitting: Internal Medicine

## 2023-06-28 ENCOUNTER — Ambulatory Visit: Payer: Managed Care, Other (non HMO)

## 2023-06-29 ENCOUNTER — Encounter: Payer: Self-pay | Admitting: Hematology

## 2023-06-30 ENCOUNTER — Other Ambulatory Visit: Payer: Self-pay | Admitting: Nurse Practitioner

## 2023-06-30 ENCOUNTER — Inpatient Hospital Stay: Payer: Managed Care, Other (non HMO)

## 2023-06-30 ENCOUNTER — Inpatient Hospital Stay: Payer: Managed Care, Other (non HMO) | Admitting: Hematology

## 2023-06-30 ENCOUNTER — Encounter: Payer: Self-pay | Admitting: Hematology

## 2023-06-30 VITALS — BP 153/96 | HR 86 | Temp 97.3°F | Resp 19 | Wt 329.3 lb

## 2023-06-30 DIAGNOSIS — C187 Malignant neoplasm of sigmoid colon: Secondary | ICD-10-CM

## 2023-06-30 DIAGNOSIS — C786 Secondary malignant neoplasm of retroperitoneum and peritoneum: Secondary | ICD-10-CM

## 2023-06-30 DIAGNOSIS — Z452 Encounter for adjustment and management of vascular access device: Secondary | ICD-10-CM

## 2023-06-30 DIAGNOSIS — D500 Iron deficiency anemia secondary to blood loss (chronic): Secondary | ICD-10-CM

## 2023-06-30 LAB — CBC WITH DIFFERENTIAL (CANCER CENTER ONLY)
Abs Immature Granulocytes: 0.03 10*3/uL (ref 0.00–0.07)
Basophils Absolute: 0 10*3/uL (ref 0.0–0.1)
Basophils Relative: 1 %
Eosinophils Absolute: 0.1 10*3/uL (ref 0.0–0.5)
Eosinophils Relative: 2 %
HCT: 33.6 % — ABNORMAL LOW (ref 36.0–46.0)
Hemoglobin: 11 g/dL — ABNORMAL LOW (ref 12.0–15.0)
Immature Granulocytes: 1 %
Lymphocytes Relative: 28 %
Lymphs Abs: 1.8 10*3/uL (ref 0.7–4.0)
MCH: 25.8 pg — ABNORMAL LOW (ref 26.0–34.0)
MCHC: 32.7 g/dL (ref 30.0–36.0)
MCV: 78.9 fL — ABNORMAL LOW (ref 80.0–100.0)
Monocytes Absolute: 0.5 10*3/uL (ref 0.1–1.0)
Monocytes Relative: 7 %
Neutro Abs: 4.2 10*3/uL (ref 1.7–7.7)
Neutrophils Relative %: 61 %
Platelet Count: 463 10*3/uL — ABNORMAL HIGH (ref 150–400)
RBC: 4.26 MIL/uL (ref 3.87–5.11)
RDW: 15.1 % (ref 11.5–15.5)
WBC Count: 6.7 10*3/uL (ref 4.0–10.5)
nRBC: 0 % (ref 0.0–0.2)

## 2023-06-30 LAB — FERRITIN: Ferritin: 13 ng/mL (ref 11–307)

## 2023-06-30 LAB — CMP (CANCER CENTER ONLY)
ALT: 13 U/L (ref 0–44)
AST: 13 U/L — ABNORMAL LOW (ref 15–41)
Albumin: 3.9 g/dL (ref 3.5–5.0)
Alkaline Phosphatase: 48 U/L (ref 38–126)
Anion gap: 7 (ref 5–15)
BUN: 17 mg/dL (ref 6–20)
CO2: 24 mmol/L (ref 22–32)
Calcium: 8.6 mg/dL — ABNORMAL LOW (ref 8.9–10.3)
Chloride: 107 mmol/L (ref 98–111)
Creatinine: 0.83 mg/dL (ref 0.44–1.00)
GFR, Estimated: >60 mL/min (ref >60)
Glucose, Bld: 89 mg/dL (ref 70–99)
Potassium: 4 mmol/L (ref 3.5–5.1)
Sodium: 138 mmol/L (ref 135–145)
Total Bilirubin: 0.2 mg/dL (ref 0.0–1.2)
Total Protein: 7.2 g/dL (ref 6.5–8.1)

## 2023-06-30 LAB — URINALYSIS, COMPLETE (UACMP) WITH MICROSCOPIC
Bilirubin Urine: NEGATIVE
Glucose, UA: NEGATIVE mg/dL
Hgb urine dipstick: NEGATIVE
Ketones, ur: NEGATIVE mg/dL
Nitrite: NEGATIVE
Protein, ur: NEGATIVE mg/dL
Specific Gravity, Urine: 1.021 (ref 1.005–1.030)
pH: 5 (ref 5.0–8.0)

## 2023-06-30 LAB — PREGNANCY, URINE: Preg Test, Ur: NEGATIVE

## 2023-06-30 MED ORDER — SODIUM CHLORIDE 0.9% FLUSH
10.0000 mL | INTRAVENOUS | Status: DC | PRN
Start: 2023-06-30 — End: 2023-06-30
  Administered 2023-06-30: 10 mL

## 2023-06-30 MED ORDER — SODIUM CHLORIDE 0.9 % IV SOLN
200.0000 mg | Freq: Once | INTRAVENOUS | Status: AC
  Administered 2023-06-30: 200 mg via INTRAVENOUS
  Filled 2023-06-30: qty 8

## 2023-06-30 MED ORDER — SODIUM CHLORIDE 0.9% FLUSH
10.0000 mL | Freq: Once | INTRAVENOUS | Status: AC
  Administered 2023-06-30: 10 mL

## 2023-06-30 MED ORDER — HEPARIN SOD (PORK) LOCK FLUSH 100 UNIT/ML IV SOLN
500.0000 [IU] | Freq: Once | INTRAVENOUS | Status: AC | PRN
  Administered 2023-06-30: 500 [IU]

## 2023-06-30 MED ORDER — DICYCLOMINE HCL 10 MG PO CAPS: 10.0000 mg | ORAL_CAPSULE | Freq: Three times a day (TID) | ORAL | 0 refills | Status: AC | PRN

## 2023-06-30 MED ORDER — SODIUM CHLORIDE 0.9 % IV SOLN: Freq: Once | INTRAVENOUS | Status: AC

## 2023-06-30 NOTE — Patient Instructions (Signed)

## 2023-06-30 NOTE — Assessment & Plan Note (Signed)
-  MMR loss of [name]6, [name] with high mutation burden, acquired BRCA mutation (+)  --Initially presented with diarrhea 11/04/20, found to have diverticulitis with perforation and also developed an abscess requiring 2 drains, multiple hospital stay  --she started FOLFOX on 06/22/21 but did not respond -she switched to [facility] on 09/08/21. She has been tolerating well overall but has developed joint pain which could be related, overall manageable. She is clinically doing better also with more energy and less abdominal pain   -her CEA has normalized on immunotherapy. -CT CAP on 03/15/22 again showed mixed response -she had PET scan on 04/26/2022 for evaluation of her ovarian cysts, and saw GYN Dr. [name], we decided to monitor it. -She tolerating Keytruda well, with mild joint pain.  She recently started workout at the gym, which has helped her joint pain. -restaging CT from 08/04/2022 showed stable disease in left abdomen and pelvis, no new lesions.  I personally reviewed her scan images, and discussed findings with patient.   -Will continue Keytruda  -repeated CT scan on January 24, 2023 showed continuous response, no new lesions.  -PET on 05/04/2023 showed no definitive evidence of progression, some mildly hypermetabolic nodes, will continue monitoring, plan to stop Keytruda in 09/2023 if no progression on next scan

## 2023-06-30 NOTE — Progress Notes (Signed)
[facility]   [phone]) [phone] [phone]   Clinic Follow up Note   Patient Care Team: [name], MD as PCP - General (Internal Medicine) [name], MD as PCP - Cardiology (Cardiology) [name], [name] as Physician Assistant (Emergency Medicine) Malachy Mood, MD as Consulting Physician (Oncology) [name], MD as Consulting Physician (General Surgery) [name], [name], MD as Consulting Physician (Neurology) [name], [name], MD as Consulting Physician (Obstetrics and Gynecology) [name], MD as Consulting Physician (Gastroenterology) [name], [name], NP as Nurse Practitioner (Nurse Practitioner)  Date of Service:  06/30/2023  CHIEF COMPLAINT: f/u of metastatic colon cancer  CURRENT THERAPY:  Keytruda every 3 weeks  Oncology History   Malignant neoplasm of sigmoid colon [facility]) -MMR loss of [name]6, [facility] with high mutation burden, acquired BRCA mutation (+)  --Initially presented with diarrhea 11/04/20, found to have diverticulitis with perforation and also developed an abscess requiring 2 drains, multiple hospital stay  --she started FOLFOX on 06/22/21 but did not respond -she switched to [facility] on 09/08/21. She has been tolerating well overall but has developed joint pain which could be related, overall manageable. She is clinically doing better also with more energy and less abdominal pain   -her CEA has normalized on immunotherapy. -CT CAP on 03/15/22 again showed mixed response -she had PET scan on 04/26/2022 for evaluation of her ovarian cysts, and saw GYN Dr. [name], we decided to monitor it. -She tolerating Keytruda well, with mild joint pain.  She recently started workout at the gym, which has helped her joint pain. -restaging CT from 08/04/2022 showed stable disease in left abdomen and pelvis, no new lesions.  I personally reviewed her scan images, and discussed findings with patient.   -Will  continue Keytruda  -repeated CT scan on January 24, 2023 showed continuous response, no new lesions.  -PET on 05/04/2023 showed no definitive evidence of progression, some mildly hypermetabolic nodes, will continue monitoring, plan to stop Keytruda in 09/2023 if no progression on next scan    Assessment and Plan    Metastatic Colon Cancer Follow-up for metastatic colon cancer. Last scan in December showed nonspecific lymph nodes but otherwise stable. Currently on Keytruda, with plans to continue until March. Plan to stop Keytruda if PET scan and Signatera test are negative. If residual disease is detected, treatment will continue or alternative therapies will be considered. - Order PET scan in late March - Schedule follow-up appointments on February 13th and March 6th - Consider Signatera blood test for circulating tumor DNA around the time of the PET scan  Neuropathic Pain Reports neuropathic pain, particularly in the hands, exacerbated by gabapentin. Gabapentin 300 mg at night causes significant drowsiness. Pain level today is 6/10, with intermittent numbness and tingling. Discussed alternative pain management options with primary care physician. - Follow up with primary care physician on February 18th to discuss alternative pain management options  Abdominal pain and back Spasms Experiences intermittent back spasms, not well controlled by muscle relaxers. Gabapentin was added but has not been effective. Spasms last 5-10 minutes and are severe at times. Discussed using Bentyl for bowel cramps and continuing current muscle relaxers. - Prescribe Bentyl 20 tablets for use as needed for spasms - Continue current muscle relaxers as needed  Hypertension Managed by primary care physician with valsartan. - Continue current management with primary care physician  Mild Anemia Mild anemia noted on recent blood tests. No specific symptoms reported. - Monitor blood counts  Urinary Symptoms Reports  lower back pain and urinary frequency. Urine culture results pending. - Review urine culture results when available  Plan -Lab reviewed, adequate for treatment, will proceed with Keytruda today and continue every 3 weeks -I called in Bentyl for her abdominal cramps -Lab and follow-up in 3 weeks before next treatment -Plan to repeat PET scan in March      SUMMARY OF ONCOLOGIC HISTORY: Oncology History Overview Note   Cancer Staging  Malignant neoplasm of sigmoid colon [facility]) Staging form: Colon and Rectum, AJCC 8th Edition - Pathologic stage from 04/15/2021: Stage IIC (pT4b, pN0, cM0) - Signed by Malachy Mood, MD on 06/12/2021    Malignant neoplasm of sigmoid colon [facility])   Initial Diagnosis   Malignant neoplasm of sigmoid colon (HCC)   11/04/2020 Imaging   EXAM: CT ABDOMEN AND PELVIS WITHOUT CONTRAST  IMPRESSION: 1. Perforating descending colonic diverticulitis with multiple abdominopelvic gas and fluid collections, measuring up to 5.7 cm and further described above. 2. Cholelithiasis without findings of acute cholecystitis. 3. 3.6 cm benign left adrenal adenoma. 4. Leiomyomatous uterus. 5. 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   11/07/2020 Imaging   EXAM: CT ABDOMEN AND PELVIS WITHOUT CONTRAST  IMPRESSION: Continued wall thickening is seen involving descending colon suggesting infectious or inflammatory colitis or perforated diverticulitis. There is an adjacent fluid collection measuring 7.0 x 5.0 cm consistent with abscess which is significantly enlarged compared to prior exam. Adjacent to the abscess, there is a severely thickened small bowel loop most consistent with secondary inflammation.   5.2 x 3.5 cm fluid collection is noted within the left psoas muscle consistent with abscess which is significantly enlarged compared to prior exam. 4.4 x 3.8 cm fluid collection  consistent with abscess is noted in the left retroperitoneal region which is also enlarged compared to prior exam.   Fibroid uterus.   Cholelithiasis.   11/27/2020 Imaging   EXAM: CT ABDOMEN AND PELVIS WITHOUT CONTRAST  IMPRESSION: 1. Significantly interval decreased size of the previously visualized left retroperitoneal and left anterior mid abdominal fluid collections. There is persistent fat stranding and mural thickening of the mid descending colon at this level. 2. Unchanged leiomyomatous uterus. 3. Unchanged cholelithiasis.   12/11/2020 Imaging   EXAM: CT ABDOMEN AND PELVIS WITHOUT CONTRAST  IMPRESSION: 1. Stable inflammatory changes centered around the distal descending colon in the left lower abdomen. Pericolonic inflammatory changes have minimally changed since 11/27/2020. Small pocket of gas medial to the colon is probably associated with a fistula or small residual abscess collection. 2. Stable position of the two percutaneous drains. The more anterior drain may be extending through a portion of the small bowel. 3. Cholelithiasis. 4. Fibroid uterus. Cannot exclude an ovarian/adnexal cystic structure near the uterine fundus. 5. Left adrenal adenoma.   01/07/2021 Imaging   EXAM: CT ABDOMEN AND PELVIS WITH CONTRAST  IMPRESSION: 1. No new abscesses identified. Similar degree of soft tissue thickening seen in the left pericolonic region. The degree of persistent soft tissues thickening in the pericolonic space further raises suspicions for malignancy. Further evaluation with colonoscopy should be performed. 2. Left retroperitoneal abscess drain unchanged in position. Anterior left abdominal drain again seen terminating within small bowel loop. 3. 2.6 cm mildly sclerotic lesion noted in the L2 vertebral body. Further evaluation with contrast enhanced lumbar spine MRI should be performed.   02/04/2021 Imaging   EXAM: CT ABDOMEN AND PELVIS WITH  CONTRAST  IMPRESSION: No  new abdominopelvic collections or abscess development in the 1 month interval.   Left anterior drain remains within a loop of small bowel, unchanged.   Left lateral abscess drain remains in the retroperitoneal space adjacent to the iliopsoas muscle with a small amount of residual fluid and air but no measurable collection.   Stable soft tissue prominence and pericolonic strandy edema/inflammation about the left descending colon compatible with residual diverticulitis/colitis. Difficult to exclude underlying transmural lesion.   04/01/2021 Imaging   EXAM: CT ABDOMEN AND PELVIS WITH CONTRAST  IMPRESSION: There appears to be significant enhancement and wall thickening involving the descending colon with some degree of traction and involvement of adjacent small bowel loops. This is consistent with the history of diverticulitis and perforation. Stable position of percutaneous drainage catheter is seen adjacent to left psoas muscle with no significant residual fluid remaining. The other percutaneous drainage catheter that was previously noted to be within small bowel loop on prior exam in the left lower quadrant, has significantly retracted and appears to be outside of the peritoneal space at this time.   Since the prior exam, there does appear to be some degree of rotation involving mesenteric vessels and structures in the right lower quadrant, suggesting partial volvulus or malrotation. There is the interval development of several lymph nodes in this area, most likely inflammatory or reactive in etiology. Mild amount of free fluid is also noted in the pelvis. However, no significant bowel wall thickening or dilatation is seen in this area. These results will be called to the ordering clinician or representative by the Radiologist Assistant, and communication documented in the PACS or zVision Dashboard.   Stable uterine fibroid.   Hepatic steatosis.    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   Cholelithiasis.   04/10/2021 Imaging   EXAM: CT ANGIOGRAPHY CHEST CT ABDOMEN AND PELVIS WITH CONTRAST  IMPRESSION: 1. Again seen are findings compatible with descending colon diverticulitis with perforation. Free air and inflammation have increased in the interval. 2. New lobulated enhancing fluid collection posterior to this segment of inflamed colon measuring 8.5 x 3.5 x 10.0 cm. This collection now invades the adjacent iliopsoas muscle as well as extends through the left lateral abdominal wall. The tip of the drainage catheter is in this collection. Findings are compatible with abscess. 3. Anterior left percutaneous drainage catheter tip has been pulled back and is now within the subcutaneous tissues. 4. Trace free fluid. 5. No pulmonary embolism.  No acute cardiopulmonary process. 6. Cholelithiasis. 7. Fatty infiltration of the liver.   04/15/2021 Definitive Surgery   FINAL MICROSCOPIC DIAGNOSIS:   A. SMALL BOWEL, RESECTION:  - Adenocarcinoma.  - No carcinoma identified in 1 lymph node.   B. PERFORATED LEFT COLON, RESECTION:  - Moderately differentiated colonic adenocarcinoma.  - Tumor extends into pericolonic adipose tissue, and is strongly  suggestive of invasion into small bowel.  See oncology table/comments.  - No carcinoma identified in 4 lymph nodes (0/4).  - Tubular adenoma with high-grade dysplasia, 1.  - Tubular adenomas with low grade dysplasia, 3.   Comments: The size of the tumor is difficult to estimate secondary to the disrupted nature of the specimen, as well as the infiltrative nature of the tumor.  Tumor can be identified as definitely invading into the pericolonic adipose tissue (block B4), but given the extreme disruption of the tissue, and the involvement of the small bowel by what appears to be a colonic adenocarcinoma, I favor perforation of the large bowel with direct  invasion into the small bowel.  Accordingly,  I believe this is best regarded as a pT4b lesion.   ADDENDUM:  Mismatch Repair Protein (IHC)  SUMMARY INTERPRETATION: ABNORMAL  There is loss of the major MMR protein [name]6: This indicates a high probability that a hereditary germline mutation is present and referral to genetic counseling is warranted. It is recommended that the loss of protein expression be correlated with molecular based microsatellite instability testing.   IHC EXPRESSION RESULTS  TEST           RESULT  MLH1:          Preserved nuclear expression  [name]2:          Preserved nuclear expression  [name]6:          LOSS OF NUCLEAR EXPRESSION  PMS2:          Preserved nuclear expression    04/15/2021 Cancer Staging   Staging form: Colon and Rectum, AJCC 8th Edition - Pathologic stage from 04/15/2021: Stage IIC (pT4b, pN0, cM0) - Signed by Malachy Mood, MD on 06/12/2021 Stage prefix: Initial diagnosis Total positive nodes: 0 Histologic grading system: 4 grade system Histologic grade (G): G2 Residual tumor (R): R0 - None   04/29/2021 Imaging   EXAM: CT ABDOMEN AND PELVIS WITH CONTRAST  IMPRESSION: Continued left retroperitoneal abscesses inferior to the left kidney, involving the left psoas muscle and left abdominal wall musculature. Overall size is decreased since prior study. Interval removal of left lower quadrant abscess drainage catheter.   New fluid collection in the cul-de-sac and wrapping around the uterus concerning for abscess.   Cholelithiasis.   Small left pleural effusion.  Bibasilar atelectasis.   Stable left adrenal adenoma.  ADDENDUM: After discussing the case with Dr. Bryn Gulling in interventional radiology, it was noted that the fluid in the pelvis originally thought to be in the cul-de-sac is likely within the vagina, best seen on sagittal imaging. Recommend speculum exam.   Also, in the left lateral wall in the area of prior abscess in the lateral wall abdominal musculature, some of the abdomen soft tissue  appears to be enhancing. While this could be infectious, cannot exclude tumor seeding in the left lateral abdominal wall, measuring 6.9 x 3.8 cm on image 64 of series 2.   06/16/2021 Imaging   EXAM: CT ABDOMEN AND PELVIS WITH CONTRAST  IMPRESSION: Increased size of bulky soft tissue masses involving the left psoas muscle and left lateral abdominal wall soft tissues, consistent with progressive metastatic disease.   Significant progression of multiple peritoneal masses throughout the abdomen and pelvis, consistent with peritoneal carcinomatosis.   New mild retroperitoneal lymphadenopathy, consistent with metastatic disease.   Stable uterine fibroid and left adrenal adenoma.   Cholelithiasis, without evidence of cholecystitis.   06/22/2021 - 08/19/2021 Chemotherapy   Patient is on Treatment Plan : COLORECTAL FOLFOX q14d x 6 months     06/22/2021 Tumor Marker   Patient's tumor was tested for the following markers: CEA. Results of the tumor marker test revealed 87.41.   07/10/2021 Pathology Results   FINAL MICROSCOPIC DIAGNOSIS:   A. PERITONEAL MASS, LEFT UPPER ABDOMINAL QUADRANT, BIOPSY:  -  Metastatic adenocarcinoma with necrosis, histologically consistent with colon primary.     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 The report date is 07/16/2021. The remainder of testing was negative/normal.  The CancerNext-Expanded + RNAinsight gene panel offered by [employer] and includes sequencing and rearrangement analysis for  the following 77 genes: IP, ALK, APC*, ATM*, AXIN2, BAP1, BARD1, BLM, BMPR1A, BRCA1*, BRCA2*, BRIP1*, CDC73, CDH1*,CDK4, CDKN1B, CDKN2A, CHEK2*, CTNNA1, DICER1, FANCC, FH, FLCN, GALNT12, KIF1B, LZTR1, MAX, MEN1, MET, MLH1*, [name]2*, [name]3, [name]6*, MUTYH*, NBN, NF1*, NF2, NTHL1, PALB2*, PHOX2B, PMS2*, POT1, PRKAR1A, PTCH1, PTEN*, RAD51C*, RAD51D*,RB1, RECQL, RET, SDHA, SDHAF2, SDHB, SDHC, SDHD, SMAD4,  SMARCA4, SMARCB1, SMARCE1, STK11, SUFU, TMEM127, TP53*,TSC1, TSC2, VHL and XRCC2 (sequencing and deletion/duplication); EGFR, EGLN1, HOXB13, KIT, MITF, PDGFRA, POLD1 and POLE (sequencing only); EPCAM and GREM1 (deletion/duplication only).   08/28/2021 Imaging   EXAM: CT CHEST, ABDOMEN, AND PELVIS WITH CONTRAST  IMPRESSION: 1. Continued interval progression of multiple metastatic soft tissue nodules/masses in the abdomen and pelvis. 2. No evidence for metastatic disease in the chest. 3. Similar appearance of the subtle lesion in the L2 vertebral body, suspicious for metastatic involvement. 4. Cholelithiasis.   09/03/2021 -  Chemotherapy   Patient is on Treatment Plan : COLORECTAL Pembrolizumab (200) q21d     09/08/2021 - 02/01/2022 Chemotherapy   Patient is on Treatment Plan : COLORECTAL Pembrolizumab (200) q21d     01/08/2022 Imaging   CT CAP IMPRESSION: 1. Mixed response to therapy. Interval decrease in size of the conglomerate multilobulated mass extending from the left psoas into the left posterior pararenal space through the left lateral abdominal wall along the tract of the previously placed percutaneous drainage catheter. Interval decrease in size of right adnexal mass and omental masses within the right lower quadrant of the abdomen. Interval development of peritoneal carcinomatosis with omental caking within the left lower quadrant of the abdomen and left subdiaphragmatic region adjacent to the spleen and along the left pericolic gutter as well as development of ascites appearing loculated within the anterior peritoneum. 2. Interval development of mild left hydronephrosis secondary to stricturing of the mid left ureter. 3. Stable left adrenal metastasis. 4. Stable sclerotic lesion within the L2 vertebral body. No new lytic or blastic bone lesions are seen. 5. Cholelithiasis. 6. No evidence of intrathoracic metastatic disease. 7. 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 Moderate colonic stool burden. No evidence of obstruction.   03/15/2022 Imaging   IMPRESSION: 1. Status post sigmoid colon resection with left lower quadrant end colostomy. 2. Multicystic bilateral ovarian lesions are slightly increased in size. 3. Diminished, small volume of loculated appearing ascites throughout the abdomen and pelvis, with similar appearance of peritoneal thickening and nodularity throughout. 4. Heterogeneously calcified, conglomerate masses in the left retroperitoneum and abdominal wall are slightly diminished in size. 5. Left adrenal lesion not significantly changed. 6. Unchanged faintly sclerotic metastatic lesion of L2. 7. Constellation of findings is consistent with mixed response to treatment however with overall little interval change. 8. Minimal residual left hydronephrosis and proximal hydroureter, improved compared to prior examination. 9. Cholelithiasis. 10. Coronary artery disease, significantly advanced for patient age.   08/04/2022 Imaging    IMPRESSION: CT CHEST:   1. No developing mass lesion, fluid collection or lymph node enlargement in the thorax. Stable tiny 3 mm lung nodule. 2. Coronary artery calcifications. Please correlate for other coronary risk factors. 3. Chest port   CT ABDOMEN AND PELVIS:   1. Extensive surgical changes identified. Left lower quadrant ostomy, diverting colostomy. Surgical changes along loops of small bowel in the right hemiabdomen. 2. Once again there are areas of nodular tissue extending along the left lateral abdominal wall, into the retroperitoneum which are similar to previous. The amount of adjacent fluid  in these locations has improved. 3. No new mass lesion, fluid collection or lymph node enlargement in the abdomen or pelvis. 4. Gallstones. 5. Enlarged uterus with fibroids. Bilateral complex cystic areas are once again seen and based on appearance  differential includes dilated fallopian tubes, hydrosalpinx versus true cystic lesions. Please correlate for any known history or prior workup 6. Limited evaluation for solid organ pathology metastatic disease without the advantage of IV contrast     05/04/2023 PET scan   PET - restaging - skull base to thigh  IMPRESSION: 1. Subcentimeter peritoneal nodules in the left abdomen have decreased in size and hypermetabolism from 04/26/2022, compatible with treated metastatic disease. 2. Chronic focal hypermetabolism associated with a gallbladder nodule. Malignancy cannot be excluded. 3. Small hypermetabolic to borderline hypermetabolic external iliac and left inguinal lymph nodes and minimally hypermetabolic small bilateral cervical lymph nodes, nonspecific. Metastatic disease not excluded. Recommend attention on follow-up. 4. Left adrenal adenoma. 5. Cystic right adnexal mass is inadequately characterized. Because this lesion is not adequately characterized, prompt [place] is recommended for further evaluation. Note: This recommendation does not apply to premenarchal patients and to those with increased risk (genetic, family history, elevated tumor markers or other high-risk factors) of ovarian cancer. Reference: JACR 2020 Feb; 17(2):248-254 6. 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 No developing mass lesion, fluid collection or lymph node enlargement in the thorax. Stable tiny 3 mm lung nodule. 2. Coronary artery calcifications. Please correlate for other coronary risk factors. 3. Chest port   CT ABDOMEN AND PELVIS:   1. Extensive surgical changes identified. Left lower quadrant ostomy,  diverting colostomy. Surgical changes along loops of small bowel in the right hemiabdomen. 2. Once again there are areas of nodular tissue extending along the left lateral abdominal wall, into the retroperitoneum which are similar to previous. The amount of adjacent fluid in these locations has improved. 3. No new mass lesion, fluid collection or lymph node enlargement in the abdomen or pelvis. 4. Gallstones. 5. Enlarged uterus with fibroids. Bilateral complex cystic areas are once again seen and based on appearance differential includes dilated fallopian tubes, hydrosalpinx versus true cystic lesions. Please correlate for any known history or prior workup 6. Limited evaluation for solid organ pathology metastatic disease without the advantage of IV contrast        Discussed the use of AI scribe software for clinical note transcription with the patient, who gave verbal consent to proceed.  History of Present Illness   A 38 year old patient with a history of metastatic colon cancer presents for a follow-up visit. The patient reports experiencing high blood pressure, for which valsartan has been added to her regimen. She also describes persistent back spasms and joint pain, which have been partially managed with gabapentin. However, the patient notes that the gabapentin seems to have increased the frequency and intensity of neuropathy symptoms in her hands, including numbness and tingling. The patient also experiences stomach spasms approximately three times a week, which can last up to ten minutes and cause significant discomfort. The patient has also been using a prescribed cream for skin irritations.         All other systems were  reviewed with the patient and are negative.  MEDICAL HISTORY:  Past Medical History:  Diagnosis Date   Allergy    seasonal   Anemia    Blood infection 1985   Blood transfusion without reported diagnosis    colon ca 04/2021   Diverticulitis    Family  history of breast cancer    Family history of colon cancer    Family history of colon cancer    Family history of stomach cancer    Hypertension    Obesity    Sleep apnea     SURGICAL HISTORY: Past Surgical History:  Procedure Laterality Date   COLECTOMY WITH COLOSTOMY CREATION/HARTMANN PROCEDURE N/A 04/15/2021   Procedure: COLOSTOMY CREATION/HARTMANN PROCEDURE;  Surgeon: [name], MD;  Location: [facility];  Service: General;  Laterality: N/A;   IR CATHETER TUBE CHANGE  12/12/2020   IR CATHETER TUBE CHANGE  01/27/2021   IR CATHETER TUBE CHANGE  02/19/2021   IR CATHETER TUBE CHANGE  04/13/2021   IR IMAGING GUIDED PORT INSERTION  10/02/2021   IR RADIOLOGIST EVAL & MGMT  11/27/2020   IR RADIOLOGIST EVAL & MGMT  12/11/2020   IR RADIOLOGIST EVAL & MGMT  01/07/2021   IR RADIOLOGIST EVAL & MGMT  02/04/2021   IR SINUS/FIST TUBE CHK-NON GI  12/12/2020   IR SINUS/FIST TUBE CHK-NON GI  02/19/2021   LAPAROSCOPIC PARTIAL COLECTOMY N/A 04/15/2021   Procedure: LAPAROSCOPIC ASSISTED HARTMANN RESECTION;  Surgeon: [name], MD;  Location: [facility];  Service: General;  Laterality: N/A;    I have reviewed the social history and family history with the patient and they are unchanged from previous note.  ALLERGIES:  is allergic to chlorhexidine gluconate, shellfish allergy, and penicillins.  MEDICATIONS:  Current Outpatient Medications  Medication Sig Dispense Refill   dicyclomine (BENTYL) 10 MG capsule Take 1 capsule (10 mg total) by mouth every 8 (eight) hours as needed for spasms. 20 capsule 0   ALPRAZolam (XANAX) 0.25 MG tablet Take 1 tablet (0.25 mg total) by mouth at bedtime as needed for anxiety. 30 tablet 0   amLODipine (NORVASC) 10 MG tablet TAKE 1 TABLET BY MOUTH EVERY DAY 90 tablet 3   furosemide (LASIX) 20 MG tablet TAKE 1 TABLET BY MOUTH EVERY DAY AS NEEDED 90 tablet 1   gabapentin (NEURONTIN) 300 MG capsule Take 1 capsule (300 mg total) by mouth at bedtime. 30 capsule 1   KLOR-CON  M20 20 MEQ tablet TAKE 1 TABLET BY MOUTH EVERY DAY 90 tablet 1   lidocaine-prilocaine (EMLA) cream Apply 1 Application topically as needed. 30 g 3   methocarbamol (ROBAXIN-750) 750 MG tablet Take 1 tablet (750 mg total) by mouth every 8 (eight) hours as needed for muscle spasms. 30 tablet 0   Metoprolol Tartrate 75 MG TABS Take 1 tablet (75 mg total) by mouth 2 (two) times daily. 180 tablet 3   POTASSIUM PO Take by mouth.     prochlorperazine (COMPAZINE) 10 MG tablet Take 1 tablet (10 mg total) by mouth every 8 (eight) hours as needed for nausea or vomiting. 30 tablet 0   sulfamethoxazole-trimethoprim (BACTRIM DS) 800-160 MG tablet      traZODone (DESYREL) 50 MG tablet TAKE 1-2 TABLETS BY MOUTH AT BEDTIME AS NEEDED FOR SLEEP. 180 tablet 1   triamcinolone cream (KENALOG) 0.1 % APPLY TO AFFECTED AREA TWICE DAILY AS NEEDED 30 g 0   valsartan (DIOVAN) 80 MG tablet Take 1 tablet (80 mg total) by mouth daily. 30 tablet 11  No current facility-administered medications for this visit.   Facility-Administered Medications Ordered in Other Visits  Medication Dose Route Frequency Provider Last Rate Last Admin   0.9 %  sodium chloride infusion   Intravenous Once Malachy Mood, MD       fentaNYL (SUBLIMAZE) injection   Intravenous PRN Mir, Al Corpus, MD   50 mcg at 10/02/21 1436   heparin lock flush 100 unit/mL  500 Units Intracatheter Once PRN Malachy Mood, MD       midazolam (VERSED) injection   Intravenous PRN Mir, Al Corpus, MD   1 mg at 10/02/21 1436   pembrolizumab (KEYTRUDA) 200 mg in sodium chloride 0.9 % 50 mL chemo infusion  200 mg Intravenous Once Malachy Mood, MD       sodium chloride flush (NS) 0.9 % injection 10 mL  10 mL Intracatheter PRN Malachy Mood, MD        PHYSICAL EXAMINATION: ECOG PERFORMANCE STATUS: 1 - Symptomatic but completely ambulatory  Vitals:   06/30/23 1227  BP: (!) 153/96  Pulse: 86  Resp: 19  Temp: (!) 97.3 F (36.3 C)  SpO2: 100%   Wt Readings from Last 3 Encounters:   06/30/23 (!) 329 lb 4.8 oz (149.4 kg)  06/14/23 (!) 328 lb 9.6 oz (149.1 kg)  06/09/23 (!) 327 lb (148.3 kg)     GENERAL:alert, no distress and comfortable SKIN: skin color, texture, turgor are normal, no rashes or significant lesions EYES: normal, Conjunctiva are pink and non-injected, sclera clear NECK: supple, thyroid normal size, non-tender, without nodularity LYMPH:  no palpable lymphadenopathy in the cervical, axillary  LUNGS: clear to auscultation and percussion with normal breathing effort HEART: regular rate & rhythm and no murmurs and no lower extremity edema ABDOMEN:abdomen soft, non-tender and normal bowel sounds Musculoskeletal:no cyanosis of digits and no clubbing  NEURO: alert & oriented x 3 with fluent speech, no focal motor/sensory deficits    LABORATORY DATA:  I have reviewed the data as listed    Latest Ref Rng & Units 06/30/2023   11:55 AM 06/09/2023    3:09 PM 05/11/2023   10:00 AM  CBC  WBC 4.0 - 10.5 K/uL 6.7  6.8  6.7   Hemoglobin 12.0 - 15.0 g/dL 91.4  78.2  95.6   Hematocrit 36.0 - 46.0 % 33.6  33.0  32.9   Platelets 150 - 400 K/uL 463  419  411         Latest Ref Rng & Units 06/30/2023   11:55 AM 06/09/2023    3:09 PM 05/11/2023   10:00 AM  CMP  Glucose 70 - 99 mg/dL 89  92  90   BUN 6 - 20 mg/dL 17  15  16    Creatinine 0.44 - 1.00 mg/dL 2.13  0.86  5.78   Sodium 135 - 145 mmol/L 138  139  137   Potassium 3.5 - 5.1 mmol/L 4.0  4.2  3.7   Chloride 98 - 111 mmol/L 107  110  107   CO2 22 - 32 mmol/L 24  25  24    Calcium 8.9 - 10.3 mg/dL 8.6  8.6  8.7   Total Protein 6.5 - 8.1 g/dL 7.2  7.1  7.2   Total Bilirubin 0.0 - 1.2 mg/dL 0.2  0.2  0.3   Alkaline Phos 38 - 126 U/L 48  44  42   AST 15 - 41 U/L 13  12  12    ALT 0 - 44 U/L 13  10  9       RADIOGRAPHIC STUDIES: I have personally reviewed the radiological images as listed and agreed with the findings in the report. No results found.    Orders Placed This Encounter  Procedures   CBC  with Differential ([facility] Only)    Standing Status:   Future    Expected Date:   07/21/2023    Expiration Date:   07/20/2024   CMP (Cancer Center only)    Standing Status:   Future    Expected Date:   07/21/2023    Expiration Date:   07/20/2024   CBC with Differential ([facility] Center Only)    Standing Status:   Future    Expected Date:   08/11/2023    Expiration Date:   08/10/2024   CMP (Cancer Center only)    Standing Status:   Future    Expected Date:   08/11/2023    Expiration Date:   08/10/2024   T4    Standing Status:   Future    Expected Date:   08/11/2023    Expiration Date:   08/10/2024   TSH    Standing Status:   Future    Expected Date:   08/11/2023    Expiration Date:   08/10/2024   CBC with Differential ([facility] Center Only)    Standing Status:   Future    Expected Date:   09/01/2023    Expiration Date:   08/31/2024   CMP (Cancer Center only)    Standing Status:   Future    Expected Date:   09/01/2023    Expiration Date:   08/31/2024   All questions were answered. The patient knows to call the clinic with any problems, questions or concerns. No barriers to learning was detected. The total time spent in the appointment was 30 minutes.     Malachy Mood, MD 06/30/2023

## 2023-07-01 ENCOUNTER — Other Ambulatory Visit: Payer: Self-pay

## 2023-07-01 ENCOUNTER — Other Ambulatory Visit: Payer: Self-pay | Admitting: Nurse Practitioner

## 2023-07-01 LAB — URINE CULTURE: Culture: NO GROWTH

## 2023-07-01 MED ORDER — NITROFURANTOIN MONOHYD MACRO 100 MG PO CAPS: 100.0000 mg | ORAL_CAPSULE | Freq: Two times a day (BID) | ORAL | 0 refills | Status: DC

## 2023-07-21 ENCOUNTER — Other Ambulatory Visit: Payer: Self-pay

## 2023-07-21 ENCOUNTER — Inpatient Hospital Stay: Payer: Managed Care, Other (non HMO)

## 2023-07-21 ENCOUNTER — Inpatient Hospital Stay: Payer: Managed Care, Other (non HMO) | Attending: Hematology

## 2023-07-21 VITALS — BP 140/75 | HR 97 | Resp 18

## 2023-07-21 DIAGNOSIS — C187 Malignant neoplasm of sigmoid colon: Secondary | ICD-10-CM

## 2023-07-21 DIAGNOSIS — Z452 Encounter for adjustment and management of vascular access device: Secondary | ICD-10-CM

## 2023-07-21 DIAGNOSIS — C786 Secondary malignant neoplasm of retroperitoneum and peritoneum: Secondary | ICD-10-CM

## 2023-07-21 DIAGNOSIS — Z5112 Encounter for antineoplastic immunotherapy: Secondary | ICD-10-CM | POA: Diagnosis not present

## 2023-07-21 LAB — CMP (CANCER CENTER ONLY)
ALT: 14 U/L (ref 0–44)
AST: 13 U/L — ABNORMAL LOW (ref 15–41)
Albumin: 4 g/dL (ref 3.5–5.0)
Alkaline Phosphatase: 53 U/L (ref 38–126)
Anion gap: 4 — ABNORMAL LOW (ref 5–15)
BUN: 17 mg/dL (ref 6–20)
CO2: 25 mmol/L (ref 22–32)
Calcium: 8.9 mg/dL (ref 8.9–10.3)
Chloride: 109 mmol/L (ref 98–111)
Creatinine: 0.82 mg/dL (ref 0.44–1.00)
GFR, Estimated: >60 mL/min (ref >60)
Glucose, Bld: 119 mg/dL — ABNORMAL HIGH (ref 70–99)
Potassium: 4 mmol/L (ref 3.5–5.1)
Sodium: 138 mmol/L (ref 135–145)
Total Bilirubin: 0.3 mg/dL (ref 0.0–1.2)
Total Protein: 7.4 g/dL (ref 6.5–8.1)

## 2023-07-21 LAB — CBC WITH DIFFERENTIAL (CANCER CENTER ONLY)
Abs Immature Granulocytes: 0.02 10*3/uL (ref 0.00–0.07)
Basophils Absolute: 0 10*3/uL (ref 0.0–0.1)
Basophils Relative: 0 %
Eosinophils Absolute: 0.1 10*3/uL (ref 0.0–0.5)
Eosinophils Relative: 2 %
HCT: 35.9 % — ABNORMAL LOW (ref 36.0–46.0)
Hemoglobin: 11.4 g/dL — ABNORMAL LOW (ref 12.0–15.0)
Immature Granulocytes: 0 %
Lymphocytes Relative: 28 %
Lymphs Abs: 1.9 10*3/uL (ref 0.7–4.0)
MCH: 26 pg (ref 26.0–34.0)
MCHC: 31.8 g/dL (ref 30.0–36.0)
MCV: 81.8 fL (ref 80.0–100.0)
Monocytes Absolute: 0.4 10*3/uL (ref 0.1–1.0)
Monocytes Relative: 6 %
Neutro Abs: 4.3 10*3/uL (ref 1.7–7.7)
Neutrophils Relative %: 64 %
Platelet Count: 471 10*3/uL — ABNORMAL HIGH (ref 150–400)
RBC: 4.39 MIL/uL (ref 3.87–5.11)
RDW: 15.9 % — ABNORMAL HIGH (ref 11.5–15.5)
WBC Count: 6.8 10*3/uL (ref 4.0–10.5)
nRBC: 0 % (ref 0.0–0.2)

## 2023-07-21 LAB — PREGNANCY, URINE: Preg Test, Ur: NEGATIVE

## 2023-07-21 LAB — CEA (ACCESS): CEA (CHCC): <1 ng/mL (ref 0.00–5.00)

## 2023-07-21 MED ORDER — SODIUM CHLORIDE 0.9% FLUSH
10.0000 mL | INTRAVENOUS | Status: DC | PRN
  Administered 2023-07-21: 10 mL

## 2023-07-21 MED ORDER — SODIUM CHLORIDE 0.9 % IV SOLN
200.0000 mg | Freq: Once | INTRAVENOUS | Status: AC
  Administered 2023-07-21: 200 mg via INTRAVENOUS
  Filled 2023-07-21: qty 8

## 2023-07-21 MED ORDER — SODIUM CHLORIDE 0.9 % IV SOLN: Freq: Once | INTRAVENOUS | Status: AC

## 2023-07-21 MED ORDER — SODIUM CHLORIDE 0.9% FLUSH
10.0000 mL | Freq: Once | INTRAVENOUS | Status: AC
  Administered 2023-07-21: 10 mL

## 2023-07-21 MED ORDER — HEPARIN SOD (PORK) LOCK FLUSH 100 UNIT/ML IV SOLN
500.0000 [IU] | Freq: Once | INTRAVENOUS | Status: AC | PRN
Start: 2023-07-21 — End: 2023-07-21
  Administered 2023-07-21: 500 [IU]

## 2023-07-21 NOTE — Addendum Note (Signed)
Addended by: Irena Cords T on: 07/21/2023 02:27 PM   Modules accepted: Orders

## 2023-07-21 NOTE — Patient Instructions (Signed)

## 2023-07-26 ENCOUNTER — Ambulatory Visit: Payer: Self-pay | Admitting: Internal Medicine

## 2023-07-26 ENCOUNTER — Encounter: Payer: Self-pay | Admitting: Internal Medicine

## 2023-07-28 ENCOUNTER — Encounter: Payer: Self-pay | Admitting: Internal Medicine

## 2023-07-28 ENCOUNTER — Telehealth (INDEPENDENT_AMBULATORY_CARE_PROVIDER_SITE_OTHER): Payer: Self-pay | Admitting: Internal Medicine

## 2023-07-28 DIAGNOSIS — Z20828 Contact with and (suspected) exposure to other viral communicable diseases: Secondary | ICD-10-CM

## 2023-07-28 DIAGNOSIS — I10 Essential (primary) hypertension: Secondary | ICD-10-CM | POA: Diagnosis not present

## 2023-07-28 DIAGNOSIS — M791 Myalgia, unspecified site: Secondary | ICD-10-CM

## 2023-07-28 DIAGNOSIS — R067 Sneezing: Secondary | ICD-10-CM

## 2023-07-28 DIAGNOSIS — J069 Acute upper respiratory infection, unspecified: Secondary | ICD-10-CM

## 2023-07-28 MED ORDER — OSELTAMIVIR PHOSPHATE 75 MG PO CAPS
75.0000 mg | ORAL_CAPSULE | Freq: Two times a day (BID) | ORAL | 0 refills | Status: AC
Start: 2023-07-28 — End: 2023-08-02

## 2023-07-28 NOTE — Progress Notes (Unsigned)
 Virtual Visit via Video Note  Madelaine Bhat, CMA,acting as a scribe for Mikayla Aliment, MD.,have documented all relevant documentation on the behalf of Mikayla Aliment, MD,as directed by  Mikayla Aliment, MD while in the presence of Mikayla Aliment, MD.  I connected with Georgina Pillion on 08/07/23 at 12:20 PM EST by a video enabled telemedicine application and verified that I am speaking with the correct person using two identifiers.  Patient Location: Home Provider Location: Office/Clinic  I discussed the limitations, risks, security, and privacy concerns of performing an evaluation and management service by video and the availability of in person appointments. I also discussed with the patient that there may be a patient responsible charge related to this service. The patient expressed understanding and agreed to proceed.  Subjective: PCP: [name], MD  No chief complaint on file.  Patient presents today (virtually) for flu exposure. Patient reports her symptoms started Tuesday.  Patient currently has sneezing, congestion, SOB, and body aches. Patient reports OTC meds have not been helpful. Patient reports her husband and kids also have the flu.     ROS: Per HPI  Current Outpatient Medications:    ALPRAZolam (XANAX) 0.25 MG tablet, Take 1 tablet (0.25 mg total) by mouth at bedtime as needed for anxiety., Disp: 30 tablet, Rfl: 0   amLODipine (NORVASC) 10 MG tablet, TAKE 1 TABLET BY MOUTH EVERY DAY, Disp: 90 tablet, Rfl: 3   dicyclomine (BENTYL) 10 MG capsule, Take 1 capsule (10 mg total) by mouth every 8 (eight) hours as needed for spasms., Disp: 20 capsule, Rfl: 0   furosemide (LASIX) 20 MG tablet, TAKE 1 TABLET BY MOUTH EVERY DAY AS NEEDED, Disp: 90 tablet, Rfl: 1   gabapentin (NEURONTIN) 300 MG capsule, Take 1 capsule (300 mg total) by mouth at bedtime., Disp: 30 capsule, Rfl: 1   KLOR-CON M20 20 MEQ tablet, TAKE 1 TABLET BY MOUTH EVERY DAY, Disp: 90 tablet, Rfl: 1    lidocaine-prilocaine (EMLA) cream, Apply 1 Application topically as needed., Disp: 30 g, Rfl: 3   methocarbamol (ROBAXIN-750) 750 MG tablet, Take 1 tablet (750 mg total) by mouth every 8 (eight) hours as needed for muscle spasms., Disp: 30 tablet, Rfl: 0   Metoprolol Tartrate 75 MG TABS, Take 1 tablet (75 mg total) by mouth 2 (two) times daily., Disp: 180 tablet, Rfl: 3   nitrofurantoin, macrocrystal-monohydrate, (MACROBID) 100 MG capsule, Take 1 capsule (100 mg total) by mouth 2 (two) times daily., Disp: 10 capsule, Rfl: 0   POTASSIUM PO, Take by mouth., Disp: , Rfl:    prochlorperazine (COMPAZINE) 10 MG tablet, Take 1 tablet (10 mg total) by mouth every 8 (eight) hours as needed for nausea or vomiting., Disp: 30 tablet, Rfl: 0   sulfamethoxazole-trimethoprim (BACTRIM DS) 800-160 MG tablet, , Disp: , Rfl:    traZODone (DESYREL) 50 MG tablet, TAKE 1-2 TABLETS BY MOUTH AT BEDTIME AS NEEDED FOR SLEEP., Disp: 180 tablet, Rfl: 1   triamcinolone cream (KENALOG) 0.1 %, APPLY TO AFFECTED AREA TWICE DAILY AS NEEDED, Disp: 30 g, Rfl: 0   valsartan (DIOVAN) 80 MG tablet, Take 1 tablet (80 mg total) by mouth daily., Disp: 30 tablet, Rfl: 11 No current facility-administered medications for this visit.  Facility-Administered Medications Ordered in Other Visits:    fentaNYL (SUBLIMAZE) injection, , Intravenous, PRN, Mir, Al Corpus, MD, 50 mcg at 10/02/21 1436   midazolam (VERSED) injection, , Intravenous, PRN, Mir, Al Corpus, MD, 1 mg at 10/02/21  1436  Observations/Objective: There were no vitals filed for this visit.  BP Readings from Last 3 Encounters:  07/21/23 (!) 140/75  06/30/23 (!) 153/96  06/14/23 (!) 150/98    Physical Exam Vitals and nursing note reviewed.  Constitutional:      Appearance: Normal appearance. She is ill-appearing.  HENT:     Head: Normocephalic and atraumatic.  Eyes:     Extraocular Movements: Extraocular movements intact.  Pulmonary:     Effort: Pulmonary effort is  normal.  Musculoskeletal:     Cervical back: Normal range of motion.  Skin:    General: Skin is warm.     Findings: No erythema.  Neurological:     General: No focal deficit present.     Mental Status: She is alert.  Psychiatric:        Mood and Affect: Mood normal.        Behavior: Behavior normal.     Assessment and Plan: Viral upper respiratory tract infection Assessment & Plan: She likely has the flu due to known exposure. She agrees to rx Tamiflu, advised to take as directed. Reminded to stay well hydrated and to avoid dairy. She is encouraged to call if she has worsening sx.    Myalgia Assessment & Plan: Her sx are likely due to the flu.  Advised to take Tylenol every 4-6 hours to help keep fever down and to decrease sx.  Also advised to stay well hydrated.    Essential hypertension Assessment & Plan: Chronic, she is currently on valsartan 80mg  Uncontrolled, start valsartan 80mg  daily and metoprolol BID.      Exposure to the flu  Other orders -     Oseltamivir Phosphate; Take 1 capsule (75 mg total) by mouth 2 (two) times daily for 5 days.  Dispense: 10 capsule; Refill: 0   TIME IN VISIT: 6 minutes  Follow Up Instructions: Return for keep same next.   I discussed the assessment and treatment plan with the patient. The patient was provided an opportunity to ask questions, and all were answered. The patient agreed with the plan and demonstrated an understanding of the instructions.   The patient was advised to call back or seek an in-person evaluation if the symptoms worsen or if the condition fails to improve as anticipated.  The above assessment and management plan was discussed with the patient. The patient verbalized understanding of and has agreed to the management plan.   I, Mikayla Aliment, MD, have reviewed all documentation for this visit. The documentation on 07/28/23 for the exam, diagnosis, procedures, and orders are all accurate and complete.

## 2023-08-07 DIAGNOSIS — J069 Acute upper respiratory infection, unspecified: Secondary | ICD-10-CM | POA: Insufficient documentation

## 2023-08-07 NOTE — Assessment & Plan Note (Signed)
 Chronic, she is currently on valsartan 80mg  Uncontrolled, start valsartan 80mg  daily and metoprolol BID.

## 2023-08-07 NOTE — Patient Instructions (Signed)

## 2023-08-07 NOTE — Assessment & Plan Note (Signed)
 Her sx are likely due to the flu.  Advised to take Tylenol every 4-6 hours to help keep fever down and to decrease sx.  Also advised to stay well hydrated.

## 2023-08-07 NOTE — Assessment & Plan Note (Signed)
 She likely has the flu due to known exposure. She agrees to rx Tamiflu, advised to take as directed. Reminded to stay well hydrated and to avoid dairy. She is encouraged to call if she has worsening sx.

## 2023-08-10 NOTE — Assessment & Plan Note (Signed)
-  MMR loss of [name]6, [name] with high mutation burden, acquired BRCA mutation (+)  --Initially presented with diarrhea 11/04/20, found to have diverticulitis with perforation and also developed an abscess requiring 2 drains, multiple hospital stay  --she started FOLFOX on 06/22/21 but did not respond -she switched to [facility] on 09/08/21. She has been tolerating well overall but has developed joint pain which could be related, overall manageable. She is clinically doing better also with more energy and less abdominal pain   -her CEA has normalized on immunotherapy. -CT CAP on 03/15/22 again showed mixed response -she had PET scan on 04/26/2022 for evaluation of her ovarian cysts, and saw GYN Dr. [name], we decided to monitor it. -She tolerating Keytruda well, with mild joint pain.  She recently started workout at the gym, which has helped her joint pain. -restaging CT from 08/04/2022 showed stable disease in left abdomen and pelvis, no new lesions.  I personally reviewed her scan images, and discussed findings with patient.   -Will continue Keytruda  -repeated CT scan on January 24, 2023 showed continuous response, no new lesions.  -PET on 05/04/2023 showed no definitive evidence of progression, some mildly hypermetabolic nodes, will continue monitoring, plan to stop Keytruda in 09/2023 if no progression on next scan

## 2023-08-11 ENCOUNTER — Inpatient Hospital Stay: Payer: Managed Care, Other (non HMO) | Admitting: Hematology

## 2023-08-11 ENCOUNTER — Inpatient Hospital Stay: Payer: Managed Care, Other (non HMO) | Attending: Hematology

## 2023-08-11 ENCOUNTER — Inpatient Hospital Stay: Payer: Managed Care, Other (non HMO)

## 2023-08-11 ENCOUNTER — Other Ambulatory Visit: Payer: Self-pay

## 2023-08-11 VITALS — BP 142/84 | HR 79 | Temp 97.5°F | Resp 22 | Ht 63.0 in | Wt 339.4 lb

## 2023-08-11 DIAGNOSIS — C786 Secondary malignant neoplasm of retroperitoneum and peritoneum: Secondary | ICD-10-CM

## 2023-08-11 DIAGNOSIS — K6819 Other retroperitoneal abscess: Secondary | ICD-10-CM | POA: Insufficient documentation

## 2023-08-11 DIAGNOSIS — K8020 Calculus of gallbladder without cholecystitis without obstruction: Secondary | ICD-10-CM | POA: Diagnosis not present

## 2023-08-11 DIAGNOSIS — Z1509 Genetic susceptibility to other malignant neoplasm: Secondary | ICD-10-CM | POA: Diagnosis not present

## 2023-08-11 DIAGNOSIS — M549 Dorsalgia, unspecified: Secondary | ICD-10-CM | POA: Insufficient documentation

## 2023-08-11 DIAGNOSIS — D259 Leiomyoma of uterus, unspecified: Secondary | ICD-10-CM | POA: Diagnosis not present

## 2023-08-11 DIAGNOSIS — Z1501 Genetic susceptibility to malignant neoplasm of breast: Secondary | ICD-10-CM | POA: Diagnosis not present

## 2023-08-11 DIAGNOSIS — R5383 Other fatigue: Secondary | ICD-10-CM | POA: Diagnosis not present

## 2023-08-11 DIAGNOSIS — C187 Malignant neoplasm of sigmoid colon: Secondary | ICD-10-CM

## 2023-08-11 DIAGNOSIS — I2510 Atherosclerotic heart disease of native coronary artery without angina pectoris: Secondary | ICD-10-CM | POA: Insufficient documentation

## 2023-08-11 DIAGNOSIS — K5720 Diverticulitis of large intestine with perforation and abscess without bleeding: Secondary | ICD-10-CM | POA: Insufficient documentation

## 2023-08-11 DIAGNOSIS — K760 Fatty (change of) liver, not elsewhere classified: Secondary | ICD-10-CM | POA: Diagnosis not present

## 2023-08-11 DIAGNOSIS — J90 Pleural effusion, not elsewhere classified: Secondary | ICD-10-CM | POA: Diagnosis not present

## 2023-08-11 DIAGNOSIS — K435 Parastomal hernia without obstruction or  gangrene: Secondary | ICD-10-CM | POA: Insufficient documentation

## 2023-08-11 DIAGNOSIS — D649 Anemia, unspecified: Secondary | ICD-10-CM | POA: Diagnosis not present

## 2023-08-11 DIAGNOSIS — D500 Iron deficiency anemia secondary to blood loss (chronic): Secondary | ICD-10-CM

## 2023-08-11 DIAGNOSIS — R180 Malignant ascites: Secondary | ICD-10-CM | POA: Diagnosis not present

## 2023-08-11 DIAGNOSIS — E669 Obesity, unspecified: Secondary | ICD-10-CM | POA: Insufficient documentation

## 2023-08-11 DIAGNOSIS — Z5112 Encounter for antineoplastic immunotherapy: Secondary | ICD-10-CM | POA: Diagnosis not present

## 2023-08-11 DIAGNOSIS — Z452 Encounter for adjustment and management of vascular access device: Secondary | ICD-10-CM

## 2023-08-11 DIAGNOSIS — I10 Essential (primary) hypertension: Secondary | ICD-10-CM | POA: Diagnosis not present

## 2023-08-11 DIAGNOSIS — G8929 Other chronic pain: Secondary | ICD-10-CM | POA: Insufficient documentation

## 2023-08-11 DIAGNOSIS — C7951 Secondary malignant neoplasm of bone: Secondary | ICD-10-CM | POA: Diagnosis not present

## 2023-08-11 DIAGNOSIS — G4730 Sleep apnea, unspecified: Secondary | ICD-10-CM | POA: Diagnosis not present

## 2023-08-11 DIAGNOSIS — N83209 Unspecified ovarian cyst, unspecified side: Secondary | ICD-10-CM | POA: Diagnosis not present

## 2023-08-11 DIAGNOSIS — R911 Solitary pulmonary nodule: Secondary | ICD-10-CM | POA: Insufficient documentation

## 2023-08-11 DIAGNOSIS — D3502 Benign neoplasm of left adrenal gland: Secondary | ICD-10-CM | POA: Diagnosis not present

## 2023-08-11 DIAGNOSIS — Z79899 Other long term (current) drug therapy: Secondary | ICD-10-CM | POA: Insufficient documentation

## 2023-08-11 DIAGNOSIS — Z933 Colostomy status: Secondary | ICD-10-CM | POA: Insufficient documentation

## 2023-08-11 DIAGNOSIS — R2689 Other abnormalities of gait and mobility: Secondary | ICD-10-CM

## 2023-08-11 LAB — CBC WITH DIFFERENTIAL (CANCER CENTER ONLY)
Abs Immature Granulocytes: 0.04 10*3/uL (ref 0.00–0.07)
Basophils Absolute: 0 10*3/uL (ref 0.0–0.1)
Basophils Relative: 0 %
Eosinophils Absolute: 0.2 10*3/uL (ref 0.0–0.5)
Eosinophils Relative: 2 %
HCT: 35 % — ABNORMAL LOW (ref 36.0–46.0)
Hemoglobin: 11 g/dL — ABNORMAL LOW (ref 12.0–15.0)
Immature Granulocytes: 1 %
Lymphocytes Relative: 31 %
Lymphs Abs: 2 10*3/uL (ref 0.7–4.0)
MCH: 25.8 pg — ABNORMAL LOW (ref 26.0–34.0)
MCHC: 31.4 g/dL (ref 30.0–36.0)
MCV: 82.2 fL (ref 80.0–100.0)
Monocytes Absolute: 0.4 10*3/uL (ref 0.1–1.0)
Monocytes Relative: 6 %
Neutro Abs: 3.9 10*3/uL (ref 1.7–7.7)
Neutrophils Relative %: 60 %
Platelet Count: 396 10*3/uL (ref 150–400)
RBC: 4.26 MIL/uL (ref 3.87–5.11)
RDW: 16 % — ABNORMAL HIGH (ref 11.5–15.5)
WBC Count: 6.6 10*3/uL (ref 4.0–10.5)
nRBC: 0 % (ref 0.0–0.2)

## 2023-08-11 LAB — CMP (CANCER CENTER ONLY)
ALT: 13 U/L (ref 0–44)
AST: 15 U/L (ref 15–41)
Albumin: 3.9 g/dL (ref 3.5–5.0)
Alkaline Phosphatase: 44 U/L (ref 38–126)
Anion gap: 4 — ABNORMAL LOW (ref 5–15)
BUN: 15 mg/dL (ref 6–20)
CO2: 24 mmol/L (ref 22–32)
Calcium: 8.6 mg/dL — ABNORMAL LOW (ref 8.9–10.3)
Chloride: 109 mmol/L (ref 98–111)
Creatinine: 0.68 mg/dL (ref 0.44–1.00)
GFR, Estimated: >60 mL/min (ref >60)
Glucose, Bld: 88 mg/dL (ref 70–99)
Potassium: 4.2 mmol/L (ref 3.5–5.1)
Sodium: 137 mmol/L (ref 135–145)
Total Bilirubin: 0.2 mg/dL (ref 0.0–1.2)
Total Protein: 7 g/dL (ref 6.5–8.1)

## 2023-08-11 LAB — FERRITIN: Ferritin: 9 ng/mL — ABNORMAL LOW (ref 11–307)

## 2023-08-11 LAB — PREGNANCY, URINE: Preg Test, Ur: NEGATIVE

## 2023-08-11 LAB — CEA (ACCESS): CEA (CHCC): <1 ng/mL (ref 0.00–5.00)

## 2023-08-11 LAB — TSH: TSH: 1.833 u[IU]/mL (ref 0.350–4.500)

## 2023-08-11 MED ORDER — SODIUM CHLORIDE 0.9 % IV SOLN: Freq: Once | INTRAVENOUS | Status: AC

## 2023-08-11 MED ORDER — LIDOCAINE-PRILOCAINE 2.5-2.5 % EX CREA: 1.0000 | TOPICAL_CREAM | CUTANEOUS | 3 refills | Status: DC | PRN

## 2023-08-11 MED ORDER — HEPARIN SOD (PORK) LOCK FLUSH 100 UNIT/ML IV SOLN
500.0000 [IU] | Freq: Once | INTRAVENOUS | Status: AC | PRN
  Administered 2023-08-11: 500 [IU]

## 2023-08-11 MED ORDER — SODIUM CHLORIDE 0.9 % IV SOLN
200.0000 mg | Freq: Once | INTRAVENOUS | Status: AC
  Administered 2023-08-11: 200 mg via INTRAVENOUS
  Filled 2023-08-11: qty 200

## 2023-08-11 MED ORDER — SODIUM CHLORIDE 0.9% FLUSH
10.0000 mL | Freq: Once | INTRAVENOUS | Status: AC
  Administered 2023-08-11: 10 mL

## 2023-08-11 MED ORDER — SODIUM CHLORIDE 0.9% FLUSH
10.0000 mL | INTRAVENOUS | Status: DC | PRN
  Administered 2023-08-11: 10 mL

## 2023-08-11 NOTE — Progress Notes (Signed)
 [facility]   [phone] [phone]   Clinic Follow up Note   Patient Care Team: [name], MD as PCP - General (Internal Medicine) [name], MD as PCP - Cardiology (Cardiology) [name], [name] as Physician Assistant (Emergency Medicine) Malachy Mood, MD as Consulting Physician (Oncology) [name], MD as Consulting Physician (General Surgery) [name], [name], MD as Consulting Physician (Neurology) [name], [name], MD as Consulting Physician (Obstetrics and Gynecology) [name], MD as Consulting Physician (Gastroenterology) [name], [name], NP as Nurse Practitioner (Nurse Practitioner)  Date of Service:  08/11/2023  CHIEF COMPLAINT: f/u of colon cancer  CURRENT THERAPY:  Keytruda every 3 weeks  Oncology History   Malignant neoplasm of sigmoid colon [facility]) -MMR loss of [name]6, [facility] with high mutation burden, acquired BRCA mutation (+)  --Initially presented with diarrhea 11/04/20, found to have diverticulitis with perforation and also developed an abscess requiring 2 drains, multiple hospital stay  --she started FOLFOX on 06/22/21 but did not respond -she switched to [facility] on 09/08/21. She has been tolerating well overall but has developed joint pain which could be related, overall manageable. She is clinically doing better also with more energy and less abdominal pain   -her CEA has normalized on immunotherapy. -CT CAP on 03/15/22 again showed mixed response -she had PET scan on 04/26/2022 for evaluation of her ovarian cysts, and saw GYN Dr. [name], we decided to monitor it. -She tolerating Keytruda well, with mild joint pain.  She recently started workout at the gym, which has helped her joint pain. -restaging CT from 08/04/2022 showed stable disease in left abdomen and pelvis, no new lesions.  I personally reviewed her scan images, and discussed findings with patient.   -Will continue Keytruda   -repeated CT scan on January 24, 2023 showed continuous response, no new lesions.  -PET on 05/04/2023 showed no definitive evidence of progression, some mildly hypermetabolic nodes, will continue monitoring, plan to stop Keytruda in 09/2023 if no progression on next scan    Assessment and Plan    Metastatic colon cancer Approaching two years since starting treatment in March 2023. Last PET scan in November showed some activity, tumor marker was negative. Current treatment is tolerable with mild fatigue as a side effect. Insurance coverage for the PET scan is being considered, as her insurance year starts over in April. - Order PET scan next month to assess cancer status - Continue current treatment if PET scan shows activity - Consider stopping treatment if PET scan shows no activity - Verify insurance coverage for PET scan  Chronic pain Chronic pain in the back, lower hip area, legs, and sometimes arms. Managed with gabapentin and over-the-counter medications. Exercises including walking, elliptical, and stationary biking help manage pain. Experiences left side weakness and is considering using a cane for support. Insurance coverage for the cane is being explored. - Prescribe a regular cane for support - Continue gabapentin for pain management - Encourage exercise and stretching to manage pain - Verify insurance coverage for cane  Mild anemia Blood count shows mild anemia, consistent with previous results. No other significant issues noted in the blood work.  Ostomy supply issue Issues with ostomy supply order through [facility] due to insurance not being recognized, resulting in out-of-pocket cost. Advised to contact the supplier to resolve the issue. Aware of the ostomy clinic for further assistance if needed. - Contact [facility] to resolve insurance issue with ostomy supplies - Contact ostomy clinic for further assistance if needed  PLAN -Lab reviewed, adequate for treatment, will  proceed with Keytruda today and continue every 3 weeks -Restaging PET scan by the end of this months - Schedule follow-up appointment in three weeks         SUMMARY OF ONCOLOGIC HISTORY: Oncology History Overview Note   Cancer Staging  Malignant neoplasm of sigmoid colon [facility]) Staging form: Colon and Rectum, AJCC 8th Edition - Pathologic stage from 04/15/2021: Stage IIC (pT4b, pN0, cM0) - Signed by Malachy Mood, MD on 06/12/2021    Malignant neoplasm of sigmoid colon [facility])   Initial Diagnosis   Malignant neoplasm of sigmoid colon (HCC)   11/04/2020 Imaging   EXAM: CT ABDOMEN AND PELVIS WITHOUT CONTRAST  IMPRESSION: 1. Perforating descending colonic diverticulitis with multiple abdominopelvic gas and fluid collections, measuring up to 5.7 cm and further described above. 2. Cholelithiasis without findings of acute cholecystitis. 3. 3.6 cm benign left adrenal adenoma. 4. Leiomyomatous uterus. 5. 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   11/07/2020 Imaging   EXAM: CT ABDOMEN AND PELVIS WITHOUT CONTRAST  IMPRESSION: Continued wall thickening is seen involving descending colon suggesting infectious or inflammatory colitis or perforated diverticulitis. There is an adjacent fluid collection measuring 7.0 x 5.0 cm consistent with abscess which is significantly enlarged compared to prior exam. Adjacent to the abscess, there is a severely thickened small bowel loop most consistent with secondary inflammation.   5.2 x 3.5 cm fluid collection is noted within the left psoas muscle consistent with abscess which is significantly enlarged compared to prior exam. 4.4 x 3.8 cm fluid collection consistent with abscess is noted in the left retroperitoneal region which is also enlarged compared to prior exam.   Fibroid uterus.   Cholelithiasis.   11/27/2020 Imaging   EXAM: CT ABDOMEN AND PELVIS  WITHOUT CONTRAST  IMPRESSION: 1. Significantly interval decreased size of the previously visualized left retroperitoneal and left anterior mid abdominal fluid collections. There is persistent fat stranding and mural thickening of the mid descending colon at this level. 2. Unchanged leiomyomatous uterus. 3. Unchanged cholelithiasis.   12/11/2020 Imaging   EXAM: CT ABDOMEN AND PELVIS WITHOUT CONTRAST  IMPRESSION: 1. Stable inflammatory changes centered around the distal descending colon in the left lower abdomen. Pericolonic inflammatory changes have minimally changed since 11/27/2020. Small pocket of gas medial to the colon is probably associated with a fistula or small residual abscess collection. 2. Stable position of the two percutaneous drains. The more anterior drain may be extending through a portion of the small bowel. 3. Cholelithiasis. 4. Fibroid uterus. Cannot exclude an ovarian/adnexal cystic structure near the uterine fundus. 5. Left adrenal adenoma.   01/07/2021 Imaging   EXAM: CT ABDOMEN AND PELVIS WITH CONTRAST  IMPRESSION: 1. No new abscesses identified. Similar degree of soft tissue thickening seen in the left pericolonic region. The degree of persistent soft tissues thickening in the pericolonic space further raises suspicions for malignancy. Further evaluation with colonoscopy should be performed. 2. Left retroperitoneal abscess drain unchanged in position. Anterior left abdominal drain again seen terminating within small bowel loop. 3. 2.6 cm mildly sclerotic lesion noted in the L2 vertebral body. Further evaluation with contrast enhanced lumbar spine MRI should be performed.   02/04/2021 Imaging   EXAM: CT ABDOMEN AND PELVIS WITH CONTRAST  IMPRESSION: No new abdominopelvic collections or abscess development in the 1 month interval.   Left anterior drain remains within a loop of small bowel,  unchanged.   Left lateral abscess drain remains in the  retroperitoneal space adjacent to the iliopsoas muscle with a small amount of residual fluid and air but no measurable collection.   Stable soft tissue prominence and pericolonic strandy edema/inflammation about the left descending colon compatible with residual diverticulitis/colitis. Difficult to exclude underlying transmural lesion.   04/01/2021 Imaging   EXAM: CT ABDOMEN AND PELVIS WITH CONTRAST  IMPRESSION: There appears to be significant enhancement and wall thickening involving the descending colon with some degree of traction and involvement of adjacent small bowel loops. This is consistent with the history of diverticulitis and perforation. Stable position of percutaneous drainage catheter is seen adjacent to left psoas muscle with no significant residual fluid remaining. The other percutaneous drainage catheter that was previously noted to be within small bowel loop on prior exam in the left lower quadrant, has significantly retracted and appears to be outside of the peritoneal space at this time.   Since the prior exam, there does appear to be some degree of rotation involving mesenteric vessels and structures in the right lower quadrant, suggesting partial volvulus or malrotation. There is the interval development of several lymph nodes in this area, most likely inflammatory or reactive in etiology. Mild amount of free fluid is also noted in the pelvis. However, no significant bowel wall thickening or dilatation is seen in this area. These results will be called to the ordering clinician or representative by the Radiologist Assistant, and communication documented in the PACS or zVision Dashboard.   Stable uterine fibroid.   Hepatic steatosis.   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   Cholelithiasis.   04/10/2021 Imaging   EXAM: CT ANGIOGRAPHY CHEST CT ABDOMEN AND PELVIS WITH CONTRAST  IMPRESSION: 1. Again seen are findings compatible with descending  colon diverticulitis with perforation. Free air and inflammation have increased in the interval. 2. New lobulated enhancing fluid collection posterior to this segment of inflamed colon measuring 8.5 x 3.5 x 10.0 cm. This collection now invades the adjacent iliopsoas muscle as well as extends through the left lateral abdominal wall. The tip of the drainage catheter is in this collection. Findings are compatible with abscess. 3. Anterior left percutaneous drainage catheter tip has been pulled back and is now within the subcutaneous tissues. 4. Trace free fluid. 5. No pulmonary embolism.  No acute cardiopulmonary process. 6. Cholelithiasis. 7. Fatty infiltration of the liver.   04/15/2021 Definitive Surgery   FINAL MICROSCOPIC DIAGNOSIS:   A. SMALL BOWEL, RESECTION:  - Adenocarcinoma.  - No carcinoma identified in 1 lymph node.   B. PERFORATED LEFT COLON, RESECTION:  - Moderately differentiated colonic adenocarcinoma.  - Tumor extends into pericolonic adipose tissue, and is strongly  suggestive of invasion into small bowel.  See oncology table/comments.  - No carcinoma identified in 4 lymph nodes (0/4).  - Tubular adenoma with high-grade dysplasia, 1.  - Tubular adenomas with low grade dysplasia, 3.   Comments: The size of the tumor is difficult to estimate secondary to the disrupted nature of the specimen, as well as the infiltrative nature of the tumor.  Tumor can be identified as definitely invading into the pericolonic adipose tissue (block B4), but given the extreme disruption of the tissue, and the involvement of the small bowel by what appears to be a colonic adenocarcinoma, I favor perforation of the large bowel with direct invasion into the small bowel.  Accordingly, I believe this is best regarded as a pT4b lesion.   ADDENDUM:  Mismatch  Repair Protein (IHC)  SUMMARY INTERPRETATION: ABNORMAL  There is loss of the major MMR protein [name]6: This indicates a high probability that  a hereditary germline mutation is present and referral to genetic counseling is warranted. It is recommended that the loss of protein expression be correlated with molecular based microsatellite instability testing.   IHC EXPRESSION RESULTS  TEST           RESULT  MLH1:          Preserved nuclear expression  [name]2:          Preserved nuclear expression  [name]6:          LOSS OF NUCLEAR EXPRESSION  PMS2:          Preserved nuclear expression    04/15/2021 Cancer Staging   Staging form: Colon and Rectum, AJCC 8th Edition - Pathologic stage from 04/15/2021: Stage IIC (pT4b, pN0, cM0) - Signed by Malachy Mood, MD on 06/12/2021 Stage prefix: Initial diagnosis Total positive nodes: 0 Histologic grading system: 4 grade system Histologic grade (G): G2 Residual tumor (R): R0 - None   04/29/2021 Imaging   EXAM: CT ABDOMEN AND PELVIS WITH CONTRAST  IMPRESSION: Continued left retroperitoneal abscesses inferior to the left kidney, involving the left psoas muscle and left abdominal wall musculature. Overall size is decreased since prior study. Interval removal of left lower quadrant abscess drainage catheter.   New fluid collection in the cul-de-sac and wrapping around the uterus concerning for abscess.   Cholelithiasis.   Small left pleural effusion.  Bibasilar atelectasis.   Stable left adrenal adenoma.  ADDENDUM: After discussing the case with Dr. Bryn Gulling in interventional radiology, it was noted that the fluid in the pelvis originally thought to be in the cul-de-sac is likely within the vagina, best seen on sagittal imaging. Recommend speculum exam.   Also, in the left lateral wall in the area of prior abscess in the lateral wall abdominal musculature, some of the abdomen soft tissue appears to be enhancing. While this could be infectious, cannot exclude tumor seeding in the left lateral abdominal wall, measuring 6.9 x 3.8 cm on image 64 of series 2.   06/16/2021 Imaging   EXAM: CT ABDOMEN AND  PELVIS WITH CONTRAST  IMPRESSION: Increased size of bulky soft tissue masses involving the left psoas muscle and left lateral abdominal wall soft tissues, consistent with progressive metastatic disease.   Significant progression of multiple peritoneal masses throughout the abdomen and pelvis, consistent with peritoneal carcinomatosis.   New mild retroperitoneal lymphadenopathy, consistent with metastatic disease.   Stable uterine fibroid and left adrenal adenoma.   Cholelithiasis, without evidence of cholecystitis.   06/22/2021 - 08/19/2021 Chemotherapy   Patient is on Treatment Plan : COLORECTAL FOLFOX q14d x 6 months     06/22/2021 Tumor Marker   Patient's tumor was tested for the following markers: CEA. Results of the tumor marker test revealed 87.41.   07/10/2021 Pathology Results   FINAL MICROSCOPIC DIAGNOSIS:   A. PERITONEAL MASS, LEFT UPPER ABDOMINAL QUADRANT, BIOPSY:  -  Metastatic adenocarcinoma with necrosis, histologically consistent with colon primary.     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 The report date is 07/16/2021. The remainder of testing was negative/normal.  The CancerNext-Expanded + RNAinsight gene panel offered by [facility] and includes sequencing and rearrangement analysis for the following 77 genes: IP, ALK, APC*, ATM*, AXIN2, BAP1, BARD1, BLM, BMPR1A, BRCA1*, BRCA2*, BRIP1*, CDC73, CDH1*,CDK4, CDKN1B, CDKN2A, CHEK2*, CTNNA1,  DICER1, FANCC, FH, FLCN, GALNT12, KIF1B, LZTR1, MAX, MEN1, MET, MLH1*, [name]2*, [name]3, [name]6*, MUTYH*, NBN, NF1*, NF2, NTHL1, PALB2*, PHOX2B, PMS2*, POT1, PRKAR1A, PTCH1, PTEN*, RAD51C*, RAD51D*,RB1, RECQL, RET, SDHA, SDHAF2, SDHB, SDHC, SDHD, SMAD4, SMARCA4, SMARCB1, SMARCE1, STK11, SUFU, TMEM127, TP53*,TSC1, TSC2, VHL and XRCC2 (sequencing and deletion/duplication); EGFR, EGLN1, HOXB13, KIT, MITF, PDGFRA, POLD1 and POLE (sequencing only); EPCAM and GREM1  (deletion/duplication only).   08/28/2021 Imaging   EXAM: CT CHEST, ABDOMEN, AND PELVIS WITH CONTRAST  IMPRESSION: 1. Continued interval progression of multiple metastatic soft tissue nodules/masses in the abdomen and pelvis. 2. No evidence for metastatic disease in the chest. 3. Similar appearance of the subtle lesion in the L2 vertebral body, suspicious for metastatic involvement. 4. Cholelithiasis.   09/03/2021 -  Chemotherapy   Patient is on Treatment Plan : COLORECTAL Pembrolizumab (200) q21d     09/08/2021 - 02/01/2022 Chemotherapy   Patient is on Treatment Plan : COLORECTAL Pembrolizumab (200) q21d     01/08/2022 Imaging   CT CAP IMPRESSION: 1. Mixed response to therapy. Interval decrease in size of the conglomerate multilobulated mass extending from the left psoas into the left posterior pararenal space through the left lateral abdominal wall along the tract of the previously placed percutaneous drainage catheter. Interval decrease in size of right adnexal mass and omental masses within the right lower quadrant of the abdomen. Interval development of peritoneal carcinomatosis with omental caking within the left lower quadrant of the abdomen and left subdiaphragmatic region adjacent to the spleen and along the left pericolic gutter as well as development of ascites appearing loculated within the anterior peritoneum. 2. Interval development of mild left hydronephrosis secondary to stricturing of the mid left ureter. 3. Stable left adrenal metastasis. 4. Stable sclerotic lesion within the L2 vertebral body. No new lytic or blastic bone lesions are seen. 5. Cholelithiasis. 6. No evidence of intrathoracic metastatic disease. 7. 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 Moderate colonic stool burden. No evidence of obstruction.   03/15/2022 Imaging   IMPRESSION: 1. Status post sigmoid colon resection with left  lower quadrant end colostomy. 2. Multicystic bilateral ovarian lesions are slightly increased in size. 3. Diminished, small volume of loculated appearing ascites throughout the abdomen and pelvis, with similar appearance of peritoneal thickening and nodularity throughout. 4. Heterogeneously calcified, conglomerate masses in the left retroperitoneum and abdominal wall are slightly diminished in size. 5. Left adrenal lesion not significantly changed. 6. Unchanged faintly sclerotic metastatic lesion of L2. 7. Constellation of findings is consistent with mixed response to treatment however with overall little interval change. 8. Minimal residual left hydronephrosis and proximal hydroureter, improved compared to prior examination. 9. Cholelithiasis. 10. Coronary artery disease, significantly advanced for patient age.   08/04/2022 Imaging    IMPRESSION: CT CHEST:   1. No developing mass lesion, fluid collection or lymph node enlargement in the thorax. Stable tiny 3 mm lung nodule. 2. Coronary artery calcifications. Please correlate for other coronary risk factors. 3. Chest port   CT ABDOMEN AND PELVIS:   1. Extensive surgical changes identified. Left lower quadrant ostomy, diverting colostomy. Surgical changes along loops of small bowel in the right hemiabdomen. 2. Once again there are areas of nodular tissue extending along the left lateral abdominal wall, into the retroperitoneum which are similar to previous. The amount of adjacent fluid in these locations has improved. 3. No new mass lesion, fluid collection or lymph node enlargement in the abdomen or pelvis. 4.  Gallstones. 5. Enlarged uterus with fibroids. Bilateral complex cystic areas are once again seen and based on appearance differential includes dilated fallopian tubes, hydrosalpinx versus true cystic lesions. Please correlate for any known history or prior workup 6. Limited evaluation for solid organ pathology metastatic  disease without the advantage of IV contrast     05/04/2023 PET scan   PET - restaging - skull base to thigh  IMPRESSION: 1. Subcentimeter peritoneal nodules in the left abdomen have decreased in size and hypermetabolism from 04/26/2022, compatible with treated metastatic disease. 2. Chronic focal hypermetabolism associated with a gallbladder nodule. Malignancy cannot be excluded. 3. Small hypermetabolic to borderline hypermetabolic external iliac and left inguinal lymph nodes and minimally hypermetabolic small bilateral cervical lymph nodes, nonspecific. Metastatic disease not excluded. Recommend attention on follow-up. 4. Left adrenal adenoma. 5. Cystic right adnexal mass is inadequately characterized. Because this lesion is not adequately characterized, prompt [place] is recommended for further evaluation. Note: This recommendation does not apply to premenarchal patients and to those with increased risk (genetic, family history, elevated tumor markers or other high-risk factors) of ovarian cancer. Reference: JACR 2020 Feb; 17(2):248-254 6. 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 No developing mass lesion, fluid collection or lymph node enlargement in the thorax. Stable tiny 3 mm lung nodule. 2. Coronary artery calcifications. Please correlate for other coronary risk factors. 3. Chest port   CT ABDOMEN AND PELVIS:   1. Extensive surgical changes identified. Left lower quadrant ostomy, diverting colostomy. Surgical changes along loops of small bowel in the right hemiabdomen. 2. Once again there are areas of nodular tissue extending along the left lateral abdominal wall, into the  retroperitoneum which are similar to previous. The amount of adjacent fluid in these locations has improved. 3. No new mass lesion, fluid collection or lymph node enlargement in the abdomen or pelvis. 4. Gallstones. 5. Enlarged uterus with fibroids. Bilateral complex cystic areas are once again seen and based on appearance differential includes dilated fallopian tubes, hydrosalpinx versus true cystic lesions. Please correlate for any known history or prior workup 6. Limited evaluation for solid organ pathology metastatic disease without the advantage of IV contrast        Discussed the use of AI scribe software for clinical note transcription with the patient, who gave verbal consent to proceed.  History of Present Illness   Mikayla Rios, a patient with a history of metastatic colon cancer, presents for a follow-up appointment. The patient reports persistent body pain, primarily located in the back, lower hip area, legs, and occasionally the arms. This is not a new symptom for the patient. The patient has been managing the pain with gabapentin and over-the-counter BC back and body pain medication, which provides some relief. The patient also engages in physical activities such as walking and using an elliptical and stationary bike, despite the pain. The patient reports left side weakness and has requested a cane for additional support, particularly when navigating stairs. The patient also recently experienced a bout of the flu but has since recovered.         All other systems were reviewed with the patient and are negative.  MEDICAL HISTORY:  Past Medical History:  Diagnosis Date   Allergy    seasonal   Anemia    Blood infection 1985   Blood transfusion without reported diagnosis    colon ca 04/2021   Diverticulitis    Family history of breast cancer    Family history of colon cancer    Family history of colon cancer    Family history of stomach cancer    Hypertension     Obesity    Sleep apnea     SURGICAL HISTORY: Past Surgical History:  Procedure Laterality Date   COLECTOMY WITH COLOSTOMY CREATION/HARTMANN PROCEDURE N/A 04/15/2021   Procedure: COLOSTOMY CREATION/HARTMANN PROCEDURE;  Surgeon: [name], MD;  Location: [facility];  Service: General;  Laterality: N/A;   IR CATHETER TUBE CHANGE  12/12/2020   IR CATHETER TUBE CHANGE  01/27/2021   IR CATHETER TUBE CHANGE  02/19/2021   IR CATHETER TUBE CHANGE  04/13/2021   IR IMAGING GUIDED PORT INSERTION  10/02/2021   IR RADIOLOGIST EVAL & MGMT  11/27/2020   IR RADIOLOGIST EVAL & MGMT  12/11/2020   IR RADIOLOGIST EVAL & MGMT  01/07/2021   IR RADIOLOGIST EVAL & MGMT  02/04/2021   IR SINUS/FIST TUBE CHK-NON GI  12/12/2020   IR SINUS/FIST TUBE CHK-NON GI  02/19/2021   LAPAROSCOPIC PARTIAL COLECTOMY N/A 04/15/2021   Procedure: LAPAROSCOPIC ASSISTED HARTMANN RESECTION;  Surgeon: [name], MD;  Location: [facility];  Service: General;  Laterality: N/A;    I have reviewed the social history and family history with the patient and they are unchanged from previous note.  ALLERGIES:  is allergic to chlorhexidine gluconate, shellfish allergy, and penicillins.  MEDICATIONS:  Current Outpatient Medications  Medication Sig Dispense Refill   ALPRAZolam (XANAX) 0.25 MG tablet Take 1 tablet (0.25 mg total) by mouth at bedtime as needed for anxiety. 30 tablet 0   amLODipine (NORVASC) 10 MG tablet TAKE 1 TABLET BY MOUTH EVERY DAY 90 tablet 3   dicyclomine (BENTYL) 10 MG capsule Take 1 capsule (10 mg total) by mouth every 8 (eight) hours as needed for spasms. 20 capsule 0   furosemide (LASIX) 20 MG tablet TAKE 1 TABLET BY MOUTH EVERY DAY AS NEEDED 90 tablet 1   gabapentin (NEURONTIN) 300 MG capsule Take 1 capsule (300 mg total) by mouth at bedtime. 30 capsule 1   KLOR-CON M20 20 MEQ tablet TAKE 1 TABLET BY MOUTH EVERY DAY 90 tablet 1   methocarbamol (ROBAXIN-750) 750 MG tablet Take 1 tablet (750 mg total) by mouth every 8  (eight) hours as needed for muscle spasms. 30 tablet 0   Metoprolol Tartrate 75 MG TABS Take 1 tablet (75 mg total) by mouth 2 (two) times daily. 180 tablet 3   nitrofurantoin, macrocrystal-monohydrate, (MACROBID) 100 MG capsule Take 1 capsule (100 mg total) by mouth 2 (two) times daily. 10 capsule 0   POTASSIUM PO Take by mouth.     prochlorperazine (COMPAZINE) 10 MG tablet Take 1 tablet (10 mg total) by mouth every 8 (eight) hours as needed for nausea or vomiting. 30 tablet 0   sulfamethoxazole-trimethoprim (BACTRIM DS) 800-160 MG tablet      traZODone (DESYREL) 50 MG tablet TAKE 1-2 TABLETS BY MOUTH AT BEDTIME AS NEEDED FOR SLEEP. 180 tablet 1   triamcinolone cream (KENALOG) 0.1 % APPLY TO AFFECTED AREA TWICE DAILY AS NEEDED 30 g 0   valsartan (DIOVAN) 80 MG tablet Take 1 tablet (80 mg total) by mouth daily. 30 tablet 11  lidocaine-prilocaine (EMLA) cream Apply 1 Application topically as needed. 30 g 3   No current facility-administered medications for this visit.   Facility-Administered Medications Ordered in Other Visits  Medication Dose Route Frequency Provider Last Rate Last Admin   fentaNYL (SUBLIMAZE) injection   Intravenous PRN Mir, Al Corpus, MD   50 mcg at 10/02/21 1436   midazolam (VERSED) injection   Intravenous PRN Mir, Al Corpus, MD   1 mg at 10/02/21 1436   sodium chloride flush (NS) 0.9 % injection 10 mL  10 mL Intracatheter PRN Malachy Mood, MD   10 mL at 08/11/23 1529    PHYSICAL EXAMINATION: ECOG PERFORMANCE STATUS: 1 - Symptomatic but completely ambulatory  Vitals:   08/11/23 1406  BP: (!) 142/84  Pulse: 79  Resp: (!) 22  Temp: (!) 97.5 F (36.4 C)  SpO2: 99%   Wt Readings from Last 3 Encounters:  08/11/23 (!) 339 lb 6.4 oz (154 kg)  06/30/23 (!) 329 lb 4.8 oz (149.4 kg)  06/14/23 (!) 328 lb 9.6 oz (149.1 kg)     GENERAL:alert, no distress and comfortable SKIN: skin color, texture, turgor are normal, no rashes or significant lesions EYES: normal,  Conjunctiva are pink and non-injected, sclera clear NECK: supple, thyroid normal size, non-tender, without nodularity LYMPH:  no palpable lymphadenopathy in the cervical, axillary  LUNGS: clear to auscultation and percussion with normal breathing effort HEART: regular rate & rhythm and no murmurs and no lower extremity edema ABDOMEN:abdomen soft, non-tender and normal bowel sounds Musculoskeletal:no cyanosis of digits and no clubbing  NEURO: alert & oriented x 3 with fluent speech, no focal motor/sensory deficits  Physical Exam          LABORATORY DATA:  I have reviewed the data as listed    Latest Ref Rng & Units 08/11/2023    1:34 PM 07/21/2023    2:27 PM 06/30/2023   11:55 AM  CBC  WBC 4.0 - 10.5 K/uL 6.6  6.8  6.7   Hemoglobin 12.0 - 15.0 g/dL 01.0  27.2  53.6   Hematocrit 36.0 - 46.0 % 35.0  35.9  33.6   Platelets 150 - 400 K/uL 396  471  463         Latest Ref Rng & Units 08/11/2023    1:34 PM 07/21/2023    2:27 PM 06/30/2023   11:55 AM  CMP  Glucose 70 - 99 mg/dL 88  644  89   BUN 6 - 20 mg/dL 15  17  17    Creatinine 0.44 - 1.00 mg/dL 0.34  7.42  5.95   Sodium 135 - 145 mmol/L 137  138  138   Potassium 3.5 - 5.1 mmol/L 4.2  4.0  4.0   Chloride 98 - 111 mmol/L 109  109  107   CO2 22 - 32 mmol/L 24  25  24    Calcium 8.9 - 10.3 mg/dL 8.6  8.9  8.6   Total Protein 6.5 - 8.1 g/dL 7.0  7.4  7.2   Total Bilirubin 0.0 - 1.2 mg/dL 0.2  0.3  0.2   Alkaline Phos 38 - 126 U/L 44  53  48   AST 15 - 41 U/L 15  13  13    ALT 0 - 44 U/L 13  14  13        RADIOGRAPHIC STUDIES: I have personally reviewed the radiological images as listed and agreed with the findings in the report. No results found.    Orders Placed  This Encounter  Procedures   NM PET Image Restag (PS) Skull Base To Thigh    Standing Status:   Future    Expected Date:   09/01/2023    Expiration Date:   08/10/2024    If indicated for the ordered procedure, I authorize the administration of a radiopharmaceutical  per Radiology protocol:   Yes    Is the patient pregnant?:   No    Preferred imaging location?:   [facility]   All questions were answered. The patient knows to call the clinic with any problems, questions or concerns. No barriers to learning was detected. The total time spent in the appointment was 25 minutes.     Malachy Mood, MD 08/11/2023

## 2023-08-11 NOTE — Patient Instructions (Signed)

## 2023-08-11 NOTE — Progress Notes (Unsigned)
 Verbal order with readback from Dr. Mosetta Putt.  Order quad cane due to unsteady gait.

## 2023-08-12 LAB — T4: T4, Total: 5.9 ug/dL (ref 4.5–12.0)

## 2023-08-16 ENCOUNTER — Ambulatory Visit: Payer: Managed Care, Other (non HMO) | Admitting: Internal Medicine

## 2023-08-18 ENCOUNTER — Encounter: Payer: Self-pay | Admitting: Internal Medicine

## 2023-08-18 ENCOUNTER — Encounter: Payer: Self-pay | Admitting: Hematology

## 2023-08-23 ENCOUNTER — Encounter: Payer: Self-pay | Admitting: Internal Medicine

## 2023-08-23 ENCOUNTER — Ambulatory Visit (INDEPENDENT_AMBULATORY_CARE_PROVIDER_SITE_OTHER): Admitting: Internal Medicine

## 2023-08-23 VITALS — BP 132/84 | HR 99 | Temp 98.8°F | Ht 63.0 in | Wt 348.8 lb

## 2023-08-23 DIAGNOSIS — G8929 Other chronic pain: Secondary | ICD-10-CM

## 2023-08-23 DIAGNOSIS — M546 Pain in thoracic spine: Secondary | ICD-10-CM

## 2023-08-23 DIAGNOSIS — M2550 Pain in unspecified joint: Secondary | ICD-10-CM

## 2023-08-23 DIAGNOSIS — I10 Essential (primary) hypertension: Secondary | ICD-10-CM | POA: Diagnosis not present

## 2023-08-23 DIAGNOSIS — N62 Hypertrophy of breast: Secondary | ICD-10-CM | POA: Diagnosis not present

## 2023-08-23 DIAGNOSIS — Z6841 Body Mass Index (BMI) 40.0 and over, adult: Secondary | ICD-10-CM

## 2023-08-23 MED ORDER — METHOCARBAMOL 750 MG PO TABS: 750.0000 mg | ORAL_TABLET | Freq: Three times a day (TID) | ORAL | 0 refills | Status: AC | PRN

## 2023-08-23 MED ORDER — DICLOFENAC SODIUM 1 % EX GEL: 2.0000 g | Freq: Four times a day (QID) | CUTANEOUS | 2 refills | Status: AC

## 2023-08-23 MED ORDER — GABAPENTIN 300 MG PO CAPS: 300.0000 mg | ORAL_CAPSULE | Freq: Every day | ORAL | 1 refills | Status: AC

## 2023-08-23 NOTE — Progress Notes (Signed)
 I,Victoria T Deloria Lair, CMA,acting as a Neurosurgeon for Gwynneth Aliment, MD.,have documented all relevant documentation on the behalf of Gwynneth Aliment, MD,as directed by  Gwynneth Aliment, MD while in the presence of Gwynneth Aliment, MD.  Subjective:  Patient ID: Mikayla Rios , female    DOB: 1985/07/20 , 38 y.o.   MRN: [id number]  Chief Complaint  Patient presents with   Med Check    HPI  Patient presents today for bp check and Gabapentin follow up. She reports compliance with medication. She states she does not feel the medication works. She still experiences the full body pain regularly.  She also would like a handicap placard while here today.   Hypertension This is a chronic problem. The current episode started more than 1 year ago. The problem has been gradually improving since onset. The problem is controlled. Risk factors for coronary artery disease include obesity and sedentary lifestyle. Past treatments include beta blockers. The current treatment provides mild improvement.    Past Medical History:  Diagnosis Date   Allergy    seasonal   Anemia    Blood infection 1985   Blood transfusion without reported diagnosis    colon ca 04/2021   Diverticulitis    Family history of breast cancer    Family history of colon cancer    Family history of colon cancer    Family history of stomach cancer    Hypertension    Obesity    Sleep apnea      Family History  Problem Relation Age of Onset   Diabetes Mother    Hypertension Mother    Colon cancer Mother        dx at age 78   Breast cancer Mother    Congestive Heart Failure Father    Hypertension Father    Diabetes Maternal Grandmother    Hypertension Maternal Grandmother    Diabetes Maternal Grandfather    Kidney disease Maternal Grandfather    Diabetes Paternal Grandmother    Autism Son    Stomach cancer Maternal Uncle    Heart disease Paternal Aunt    Colon polyps Half-Sister        colectomy   Ovarian cancer Neg Hx     Endometrial cancer Neg Hx    Pancreatic cancer Neg Hx    Prostate cancer Neg Hx      Current Outpatient Medications:    ALPRAZolam (XANAX) 0.25 MG tablet, Take 1 tablet (0.25 mg total) by mouth at bedtime as needed for anxiety., Disp: 30 tablet, Rfl: 0   amLODipine (NORVASC) 10 MG tablet, TAKE 1 TABLET BY MOUTH EVERY DAY, Disp: 90 tablet, Rfl: 3   diclofenac Sodium (VOLTAREN) 1 % GEL, Apply 2 g topically 4 (four) times daily., Disp: 100 g, Rfl: 2   dicyclomine (BENTYL) 10 MG capsule, Take 1 capsule (10 mg total) by mouth every 8 (eight) hours as needed for spasms., Disp: 20 capsule, Rfl: 0   furosemide (LASIX) 20 MG tablet, TAKE 1 TABLET BY MOUTH EVERY DAY AS NEEDED, Disp: 90 tablet, Rfl: 1   KLOR-CON M20 20 MEQ tablet, TAKE 1 TABLET BY MOUTH EVERY DAY, Disp: 90 tablet, Rfl: 1   lidocaine-prilocaine (EMLA) cream, Apply 1 Application topically as needed., Disp: 30 g, Rfl: 3   Metoprolol Tartrate 75 MG TABS, Take 1 tablet (75 mg total) by mouth 2 (two) times daily., Disp: 180 tablet, Rfl: 3   nitrofurantoin, macrocrystal-monohydrate, (MACROBID) 100 MG capsule, Take 1 capsule (  100 mg total) by mouth 2 (two) times daily., Disp: 10 capsule, Rfl: 0   POTASSIUM PO, Take by mouth., Disp: , Rfl:    prochlorperazine (COMPAZINE) 10 MG tablet, Take 1 tablet (10 mg total) by mouth every 8 (eight) hours as needed for nausea or vomiting., Disp: 30 tablet, Rfl: 0   sulfamethoxazole-trimethoprim (BACTRIM DS) 800-160 MG tablet, , Disp: , Rfl:    traZODone (DESYREL) 50 MG tablet, TAKE 1-2 TABLETS BY MOUTH AT BEDTIME AS NEEDED FOR SLEEP., Disp: 180 tablet, Rfl: 1   triamcinolone cream (KENALOG) 0.1 %, APPLY TO AFFECTED AREA TWICE DAILY AS NEEDED, Disp: 30 g, Rfl: 0   valsartan (DIOVAN) 80 MG tablet, Take 1 tablet (80 mg total) by mouth daily., Disp: 30 tablet, Rfl: 11   acetaminophen-codeine (TYLENOL #3) 300-30 MG tablet, Take 1 tablet by mouth every 8 (eight) hours as needed for moderate pain (pain score  4-6)., Disp: 30 tablet, Rfl: 0   gabapentin (NEURONTIN) 300 MG capsule, Take 1 capsule (300 mg total) by mouth at bedtime., Disp: 90 capsule, Rfl: 1   methocarbamol (ROBAXIN-750) 750 MG tablet, Take 1 tablet (750 mg total) by mouth every 8 (eight) hours as needed for muscle spasms., Disp: 60 tablet, Rfl: 0 No current facility-administered medications for this visit.  Facility-Administered Medications Ordered in Other Visits:    fentaNYL (SUBLIMAZE) injection, , Intravenous, PRN, Mir, Al Corpus, MD, 50 mcg at 10/02/21 1436   midazolam (VERSED) injection, , Intravenous, PRN, Mir, Al Corpus, MD, 1 mg at 10/02/21 1436   Allergies  Allergen Reactions   Chlorhexidine Gluconate Hives and Itching    CHG wipes cause itching and hives requiring benadryl   Shellfish Allergy Hives   Penicillins Hives and Rash    Has patient had a PCN reaction causing immediate rash, facial/tongue/throat swelling, SOB or lightheadedness with hypotension: No Has patient had a PCN reaction causing severe rash involving mucus membranes or skin necrosis: hives Has patient had a PCN reaction that required hospitalization No Has patient had a PCN reaction occurring within the last 10 years: yes If all of the above answers are "NO", then may proceed with Cephalosporin use.      Review of Systems  Constitutional: Negative.   Respiratory: Negative.    Cardiovascular: Negative.   Musculoskeletal:  Positive for arthralgias and back pain.  Neurological: Negative.   Psychiatric/Behavioral: Negative.       Today's Vitals   08/23/23 1526  BP: 132/84  Pulse: 99  Temp: 98.8 F (37.1 C)  SpO2: 98%  Weight: (!) 348 lb 12.8 oz (158.2 kg)  Height: 5\' 3"  (1.6 m)   Body mass index is 61.79 kg/m.  Wt Readings from Last 3 Encounters:  08/23/23 (!) 348 lb 12.8 oz (158.2 kg)  08/11/23 (!) 339 lb 6.4 oz (154 kg)  06/30/23 (!) 329 lb 4.8 oz (149.4 kg)     Objective:  Physical Exam Vitals and nursing note reviewed.   Constitutional:      Appearance: Normal appearance. She is obese.  HENT:     Head: Normocephalic and atraumatic.  Eyes:     Extraocular Movements: Extraocular movements intact.  Cardiovascular:     Rate and Rhythm: Normal rate and regular rhythm.     Heart sounds: Normal heart sounds.  Pulmonary:     Effort: Pulmonary effort is normal.     Breath sounds: Normal breath sounds.  Musculoskeletal:        General: Tenderness present.     Cervical  back: Normal range of motion.  Skin:    General: Skin is warm.  Neurological:     General: No focal deficit present.     Mental Status: She is alert.  Psychiatric:        Mood and Affect: Mood normal.        Behavior: Behavior normal.         Assessment And Plan:  Essential hypertension Assessment & Plan: Chronic, fair control.  She is currently on valsartan 80mg , amlodipine 10mg  daily and metoprolol tartrate 75mg  twice daily. Importance of following a low sodium diet was discussed with the patient.       Chronic midline thoracic back pain Assessment & Plan: Her sx are likely exacerbated by macromastia. She has tried Salonpas lidocaine patches without significant relief of her sx. She will continue with rx gabapentin 300mg  to use nightly. She would likely benefit from PT to help with core strength.    Macromastia Assessment & Plan: Chronic, she does not wish to consider b/l breast reduction at this time. This is likely contributing to her thoracic back pain.    Arthralgia, unspecified joint Assessment & Plan: Chronic, as per Oncology, this could be a result of Keytruda. I will check an arthritis panel. She is encouraged to follow an anti-inflammatory diet. I will make further recommendations once her labs are available for review.   Orders: -     ANA, IFA (with reflex) -     CYCLIC CITRUL PEPTIDE ANTIBODY, IGG/IGA -     Rheumatoid factor -     Sedimentation rate -     Uric acid  Morbid obesity with body mass index of  60.0-69.9 in adult [facility]) Assessment & Plan: BMI 61.  She is aware of 19lb weight gain since January 2025. She is encouraged to decrease her intake of sugary foods/drinks and to gradually increase her daily activity level.    Other orders -     Gabapentin; Take 1 capsule (300 mg total) by mouth at bedtime.  Dispense: 90 capsule; Refill: 1 -     Methocarbamol; Take 1 tablet (750 mg total) by mouth every 8 (eight) hours as needed for muscle spasms.  Dispense: 60 tablet; Refill: 0 -     Diclofenac Sodium; Apply 2 g topically 4 (four) times daily.  Dispense: 100 g; Refill: 2  She is encouraged to strive for BMI less than 30 to decrease cardiac risk. Advised to aim for at least 150 minutes of exercise per week.    Return if symptoms worsen or fail to improve.  Patient was given opportunity to ask questions. Patient verbalized understanding of the plan and was able to repeat key elements of the plan. All questions were answered to their satisfaction.    I, Gwynneth Aliment, MD, have reviewed all documentation for this visit. The documentation on 08/23/23 for the exam, diagnosis, procedures, and orders are all accurate and complete.   IF YOU HAVE BEEN REFERRED TO A SPECIALIST, IT MAY TAKE 1-2 WEEKS TO SCHEDULE/PROCESS THE REFERRAL. IF YOU HAVE NOT HEARD FROM US/SPECIALIST IN TWO WEEKS, PLEASE GIVE [place] A CALL AT [phone].   THE PATIENT IS ENCOURAGED TO PRACTICE SOCIAL DISTANCING DUE TO THE COVID-19 PANDEMIC.

## 2023-08-23 NOTE — Patient Instructions (Signed)
 Hibiscus tea Chronic Back Pain Chronic back pain is back pain that lasts longer than 3 months. The pain may get worse at certain times (flare-ups). There are things you can do at home to manage your pain. Follow these instructions at home: Watch for any changes in your symptoms. Take these actions to help with your pain: Managing pain and stiffness     If told, put ice on the painful area. You may be told to use ice for 24-48 hours after a flare-up starts. Put ice in a plastic bag. Place a towel between your skin and the bag. Leave the ice on for 20 minutes, 2-3 times a day. If told, put heat on the painful area. Do this as often as told by your doctor. Use the heat source that your doctor recommends, such as a moist heat pack or a heating pad. Place a towel between your skin and the heat source. Leave the heat on for 20-30 minutes. If your skin turns bright red, take off the ice or heat right away to prevent skin damage. The risk of damage is higher if you cannot feel pain, heat, or cold. Soak in a warm bath. This can help with pain. Activity        Avoid bending and other activities that make the pain worse. When you stand: Keep your upper back and neck straight. Keep your shoulders pulled back. Avoid slouching. When you sit: Keep your back straight. Relax your shoulders. Do not round your shoulders or pull them backward. Do not sit or stand in one place for too long. Take short rest breaks during the day. Lying down or standing is often better than sitting. Resting can help relieve pain. When sitting or lying down for a long time, do some mild activity or stretching. This will help to prevent stiffness and pain. Get regular exercise. Ask your doctor what activities are safe for you. You may have to avoid lifting. Ask your provider how much you can safely lift. If you lift things: Bend your knees. Keep the weight close to your body. Avoid twisting. Medicines Take  over-the-counter and prescription medicines only as told by your doctor. You may need to take medicines for pain and swelling. These may be taken by mouth or put on the skin. You may also be given muscle relaxants. Ask your doctor if the medicine prescribed to you: Requires you to avoid driving or using machinery. Can cause trouble pooping (constipation). You may need to take these actions to prevent or treat trouble pooping: Drink enough fluid to keep your pee (urine) pale yellow. Take over-the-counter or prescription medicines. Eat foods that are high in fiber. These include beans, whole grains, and fresh fruits and vegetables. Limit foods that are high in fat and sugars. These include fried or sweet foods. General instructions  Sleep on a firm mattress. Try lying on your side with your knees slightly bent. If you lie on your back, put a pillow under your knees. Do not smoke or use any products that contain nicotine or tobacco. If you need help quitting, ask your doctor. Contact a doctor if: Your pain does not get better with rest or medicine. You have new pain. You have a fever. You lose weight quickly. You have trouble doing your normal activities. One or both of your legs or feet feel weak. One or both of your legs or feet lose feeling (have numbness). Get help right away if: You are not able to control when  you pee or poop. You have bad back pain and: You feel like you may vomit (nauseous). You vomit. You have pain in your chest or your belly (abdomen). You have shortness of breath. You faint. These symptoms may be an emergency. Get help right away. Call 911. Do not wait to see if the symptoms will go away. Do not drive yourself to the hospital. This information is not intended to replace advice given to you by your health care provider. Make sure you discuss any questions you have with your health care provider. Document Revised: 01/11/2022 Document Reviewed:  01/11/2022 Elsevier Patient Education  2024 [employer].

## 2023-08-29 ENCOUNTER — Encounter (HOSPITAL_COMMUNITY)
Admission: RE | Admit: 2023-08-29 | Discharge: 2023-08-29 | Disposition: A | Discharge status: Home / Self Care | Source: Ambulatory Visit | Attending: Hematology | Admitting: Hematology

## 2023-08-29 DIAGNOSIS — C187 Malignant neoplasm of sigmoid colon: Secondary | ICD-10-CM | POA: Insufficient documentation

## 2023-08-29 LAB — GLUCOSE, CAPILLARY: Glucose-Capillary: 92 mg/dL (ref 70–99)

## 2023-08-29 LAB — SEDIMENTATION RATE: Sed Rate: 36 mm/h — ABNORMAL HIGH (ref 0–32)

## 2023-08-29 LAB — CYCLIC CITRUL PEPTIDE ANTIBODY, IGG/IGA: Cyclic Citrullin Peptide Ab: 4 U (ref 0–19)

## 2023-08-29 LAB — RHEUMATOID FACTOR: Rheumatoid fact SerPl-aCnc: 10.8 [IU]/mL (ref <14.0)

## 2023-08-29 LAB — ANTINUCLEAR ANTIBODIES, IFA: ANA Titer 1: NEGATIVE

## 2023-08-29 LAB — URIC ACID: Uric Acid: 4.9 mg/dL (ref 2.6–6.2)

## 2023-08-29 MED ORDER — FLUDEOXYGLUCOSE F - 18 (FDG) INJECTION
15.9000 | Freq: Once | INTRAVENOUS | Status: AC
  Administered 2023-08-29: 15.9 via INTRAVENOUS

## 2023-08-30 NOTE — Progress Notes (Deleted)
 Cardiology Office Note:    Date:  08/30/2023   ID:  Mikayla Rios, DOB Aug 09, 1985, MRN [id number]  PCP:  [name], MD  Cardiologist:  [name], MD { Click to update primary MD,subspecialty MD or APP then REFRESH:1}    Referring MD: [name], MD   Chief Complaint: routine follow-up of palpitations/ tachycardia  History of Present Illness:    Mikayla Rios is a 38 y.o. female with a history of coronary artery calcifications noted on prior CTs, palpitations/tachycardia with no significant arrhythmias noted on monitor in 11/2019 hypertension, hyperlipidemia, obstructive sleep apnea, perforated diverticulitis with retroperitoneal abscess s/p colectomy and end colostomy in 04/2021, colon cancer, anemia, chronic pain, and morbid obesity (BMI 61) who is followed by Dr. Gaynelle Arabian and presents today for routine follow-up.   Patient was referred to Dr. Bjorn Pippin in 10/2019 for further evaluation of tachycardia with episodes of what patient described as heart racing a couple times per day. She also reported some dyspnea with exertion at that time. Echo and ZIO monitor were ordered for further evaluation. Echo showed LVEF of 35% with  mild LVH but no regional wall motion abnormalities and normal diastolic parameters. ZIO monitor showed rare PAC/PVCs but no significant rhythm.   She had multiple admissions in 2022 and 2023 for noncardiac issues. She was admitted for septic shock secondary to chronic perperforated colon with retroperitoneal abscess and partial small bowel obstruction during one of these admissions. 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 Echo during that admission showed LVEF of 60-65% with no regional wall motion abnormalities and no significant valvular disease.   She was last seen by me in 12/2021 at which time she reported occasional episodes of dizziness and near syncope that seemed to be related  to drops in her BP. She was advised to keep a BP/HR log for 2 weeks to help determine whether any medications adjustments needed to be made.   Patient presents today for follow-up. ***  Coronary Calcifications Prior CT scan in 10/2021 showed mild coronary calcifications. - No chest pain or anginal equivalent symptoms. *** - Not currently on aspirin or a statin.  - Recommend starting Aspirin 81mg  daily and Lipitor 40mg  daily.    History of Palpitations/ Sinus Tachycardia Zio monitor in 10/2019 showed rare PACs/PVCs but no significant arrhythmias.  - Well controlled on higher dose of beta-blocker. *** - Continue Lopressor 75mg  twice daily.   Hypertension BP well controlled. *** - Continue current medications: Amlodipine 10mg  daily, Valsartan 80mg  daily, and Lopressor 75mg  twice daily.  Hyperlipidemia Lipid panel in 11/2022: Total Cholesterol 205, Triglycerides 121, HDL 60, LDL 124. LDL goal <70 given coronary artery calcifications.  - Will start Lipitor 40mg  daily.  - Will repeat lipid panel and LFTs in 6-8 weeks. ***  Obstructive Sleep Apnea Sleep Difficulties  No longer using CPAP. *** - CPAP was previously managed by [facility]. ***   EKGs/Labs/Other Studies Reviewed:    The following studies were reviewed:  Zio Monitor 10/23/2019 to 10/26/2019: No significant arrhythmias detected   3 days of data recorded on Zio monitor. Patient had a min HR of 55 bpm, max HR of 132 bpm, and avg HR of 81 bpm. Predominant underlying rhythm was Sinus Rhythm. No VT, SVT, atrial fibrillation, high degree block, or pauses noted. Isolated atrial and ventricular ectopy was rare (<1%). There were 0 triggered events. No significant arrhythmias detected. _______________   Echocardiogram 04/14/2021: Impressions:  1. Left ventricular ejection fraction, by estimation, is 60 to 65%. The  left ventricle has normal function. The left ventricle has no regional  wall motion abnormalities.  Indeterminate diastolic filling due to E-A  fusion.   2. Right ventricular systolic function is normal. The right ventricular  size is normal.   3. The mitral valve is normal in structure. No evidence of mitral valve  regurgitation. No evidence of mitral stenosis.   4. The aortic valve is normal in structure. Aortic valve regurgitation is  not visualized. No aortic stenosis is present.   5. The inferior vena cava is normal in size with greater than 50%  respiratory variability, suggesting right atrial pressure of 3 mmHg.  EKG:  EKG *** ordered today. EKG personally reviewed and demonstrates ***.  Recent Labs: 08/11/2023: ALT 13; BUN 15; Creatinine 0.68; Hemoglobin 11.0; Platelet Count 396; Potassium 4.2; Sodium 137; TSH 1.833  Recent Lipid Panel    Component Value Date/Time   CHOL 205 (H) 11/30/2022 1726   TRIG 121 11/30/2022 1726   HDL 60 11/30/2022 1726   CHOLHDL 3.4 11/30/2022 1726   LDLCALC 124 (H) 11/30/2022 1726    Physical Exam:    Vital Signs: There were no vitals taken for this visit.    Wt Readings from Last 3 Encounters:  08/23/23 (!) 348 lb 12.8 oz (158.2 kg)  08/11/23 (!) 339 lb 6.4 oz (154 kg)  06/30/23 (!) 329 lb 4.8 oz (149.4 kg)     General: 38 y.o. female in no acute distress. HEENT: Normocephalic and atraumatic. Sclera clear.  Neck: Supple. No carotid bruits. No JVD. Heart: *** RRR. Distinct S1 and S2. No murmurs, gallops, or rubs.  Lungs: No increased work of breathing. Clear to ausculation bilaterally. No wheezes, rhonchi, or rales.  Abdomen: Soft, non-distended, and non-tender to palpation.  Extremities: No lower extremity edema.  Radial and distal pedal pulses 2+ and equal bilaterally. Skin: Warm and dry. Neuro: No focal deficits. Psych: Normal affect. Responds appropriately.   Assessment:    No diagnosis found.  Plan:     Disposition: Follow up in ***   Signed, Corrin Parker, PA-C  08/30/2023 12:11 PM    [facility]

## 2023-09-01 ENCOUNTER — Inpatient Hospital Stay (HOSPITAL_BASED_OUTPATIENT_CLINIC_OR_DEPARTMENT_OTHER): Admitting: Hematology

## 2023-09-01 ENCOUNTER — Inpatient Hospital Stay: Payer: Managed Care, Other (non HMO)

## 2023-09-01 ENCOUNTER — Encounter: Payer: Self-pay | Admitting: Hematology

## 2023-09-01 VITALS — BP 170/87 | HR 84 | Temp 98.8°F | Resp 12

## 2023-09-01 DIAGNOSIS — C786 Secondary malignant neoplasm of retroperitoneum and peritoneum: Secondary | ICD-10-CM

## 2023-09-01 DIAGNOSIS — C187 Malignant neoplasm of sigmoid colon: Secondary | ICD-10-CM | POA: Diagnosis not present

## 2023-09-01 DIAGNOSIS — D490 Neoplasm of unspecified behavior of digestive system: Secondary | ICD-10-CM

## 2023-09-01 LAB — CBC WITH DIFFERENTIAL (CANCER CENTER ONLY)
Abs Immature Granulocytes: 0.02 10*3/uL (ref 0.00–0.07)
Basophils Absolute: 0 10*3/uL (ref 0.0–0.1)
Basophils Relative: 1 %
Eosinophils Absolute: 0.1 10*3/uL (ref 0.0–0.5)
Eosinophils Relative: 2 %
HCT: 35.4 % — ABNORMAL LOW (ref 36.0–46.0)
Hemoglobin: 11.4 g/dL — ABNORMAL LOW (ref 12.0–15.0)
Immature Granulocytes: 0 %
Lymphocytes Relative: 27 %
Lymphs Abs: 1.7 10*3/uL (ref 0.7–4.0)
MCH: 26.4 pg (ref 26.0–34.0)
MCHC: 32.2 g/dL (ref 30.0–36.0)
MCV: 81.9 fL (ref 80.0–100.0)
Monocytes Absolute: 0.4 10*3/uL (ref 0.1–1.0)
Monocytes Relative: 6 %
Neutro Abs: 4.1 10*3/uL (ref 1.7–7.7)
Neutrophils Relative %: 64 %
Platelet Count: 302 10*3/uL (ref 150–400)
RBC: 4.32 MIL/uL (ref 3.87–5.11)
RDW: 16 % — ABNORMAL HIGH (ref 11.5–15.5)
Smear Review: NORMAL
WBC Count: 6.4 10*3/uL (ref 4.0–10.5)
nRBC: 0 % (ref 0.0–0.2)

## 2023-09-01 LAB — CMP (CANCER CENTER ONLY)
ALT: 10 U/L (ref 0–44)
AST: 13 U/L — ABNORMAL LOW (ref 15–41)
Albumin: 4 g/dL (ref 3.5–5.0)
Alkaline Phosphatase: 41 U/L (ref 38–126)
Anion gap: 7 (ref 5–15)
BUN: 20 mg/dL (ref 6–20)
CO2: 23 mmol/L (ref 22–32)
Calcium: 9 mg/dL (ref 8.9–10.3)
Chloride: 107 mmol/L (ref 98–111)
Creatinine: 0.71 mg/dL (ref 0.44–1.00)
GFR, Estimated: >60 mL/min (ref >60)
Glucose, Bld: 90 mg/dL (ref 70–99)
Potassium: 4.6 mmol/L (ref 3.5–5.1)
Sodium: 137 mmol/L (ref 135–145)
Total Bilirubin: 0.2 mg/dL (ref 0.0–1.2)
Total Protein: 7.4 g/dL (ref 6.5–8.1)

## 2023-09-01 MED ORDER — SODIUM CHLORIDE 0.9 % IV SOLN
200.0000 mg | Freq: Once | INTRAVENOUS | Status: AC
  Administered 2023-09-01: 200 mg via INTRAVENOUS
  Filled 2023-09-01: qty 200

## 2023-09-01 MED ORDER — SODIUM CHLORIDE 0.9% FLUSH
10.0000 mL | INTRAVENOUS | Status: DC | PRN
Start: 2023-09-01 — End: 2023-09-01
  Administered 2023-09-01: 10 mL

## 2023-09-01 MED ORDER — SODIUM CHLORIDE 0.9 % IV SOLN: Freq: Once | INTRAVENOUS | Status: AC

## 2023-09-01 MED ORDER — HEPARIN SOD (PORK) LOCK FLUSH 100 UNIT/ML IV SOLN
500.0000 [IU] | Freq: Once | INTRAVENOUS | Status: AC | PRN
  Administered 2023-09-01: 500 [IU]

## 2023-09-01 MED ORDER — ACETAMINOPHEN-CODEINE 300-30 MG PO TABS: 1.0000 | ORAL_TABLET | Freq: Three times a day (TID) | ORAL | 0 refills | Status: DC | PRN

## 2023-09-01 MED ORDER — ACETAMINOPHEN 325 MG PO TABS
650.0000 mg | ORAL_TABLET | Freq: Once | ORAL | Status: AC
  Administered 2023-09-01: 650 mg via ORAL
  Filled 2023-09-01: qty 2

## 2023-09-01 NOTE — Progress Notes (Signed)
 [facility]   [phone] [phone]   Clinic Follow up Note   Patient Care Team: [name], MD as PCP - General (Internal Medicine) [name], MD as PCP - Cardiology (Cardiology) [name], [name] as Physician Assistant (Emergency Medicine) Malachy Mood, MD as Consulting Physician (Oncology) [name], MD as Consulting Physician (General Surgery) [name], [name], MD as Consulting Physician (Neurology) [name], [name], MD as Consulting Physician (Obstetrics and Gynecology) [name], MD as Consulting Physician (Gastroenterology) [name], [name], NP as Nurse Practitioner (Nurse Practitioner)  Date of Service:  09/01/2023  CHIEF COMPLAINT: f/u of colon cancer  CURRENT THERAPY:  Keytruda every 3 weeks  Oncology History   Malignant neoplasm of sigmoid colon [facility]) -MMR loss of [name]6, [facility] with high mutation burden, acquired BRCA mutation (+)  --Initially presented with diarrhea 11/04/20, found to have diverticulitis with perforation and also developed an abscess requiring 2 drains, multiple hospital stay  --she started FOLFOX on 06/22/21 but did not respond -she switched to [facility] on 09/08/21. She has been tolerating well overall but has developed joint pain which could be related, overall manageable. She is clinically doing better also with more energy and less abdominal pain   -her CEA has normalized on immunotherapy. -CT CAP on 03/15/22 again showed mixed response -she had PET scan on 04/26/2022 for evaluation of her ovarian cysts, and saw GYN Dr. [name], we decided to monitor it. -She tolerating Keytruda well, with mild joint pain.  She recently started workout at the gym, which has helped her joint pain. -restaging CT from 08/04/2022 showed stable disease in left abdomen and pelvis, no new lesions.  I personally reviewed her scan images, and discussed findings with patient.   -Will continue  Keytruda  -repeated CT scan on January 24, 2023 showed continuous response, no new lesions.  -PET on 05/04/2023 showed no definitive evidence of progression, some mildly hypermetabolic nodes, will continue monitoring, plan to stop Keytruda in 09/2023 if no progression on next scan    Assessment and Plan    Colon Cancer Colon cancer is well-controlled with Keytruda. Recent PET scan 08/29/2023 appears stable with no new findings, formal report is not back. Plan to continue Keytruda for two years and stop in early June 2025 , provided joint pain remains manageable. - Continue Keytruda treatment  Abdominal Pain Experiences abdominal pain managed with Tylenol. Pain is not severe enough for additional interventions.  Arthralgia -Likely secondary to immunotherapy. - Prescribe Tylenol #3    Plan -I recommend reviewed her PET scan, and discussed this finding with her.  The formal report is not back yet -Will proceed with Keytruda today and continue every 3 weeks -Follow-up in 3 weeks    SUMMARY OF ONCOLOGIC HISTORY: Oncology History Overview Note   Cancer Staging  Malignant neoplasm of sigmoid colon [facility]) Staging form: Colon and Rectum, AJCC 8th Edition - Pathologic stage from 04/15/2021: Stage IIC (pT4b, pN0, cM0) - Signed by Malachy Mood, MD on 06/12/2021    Malignant neoplasm of sigmoid colon [facility])   Initial Diagnosis   Malignant neoplasm of sigmoid colon (HCC)   11/04/2020 Imaging   EXAM: CT ABDOMEN AND PELVIS WITHOUT CONTRAST  IMPRESSION: 1. Perforating descending colonic diverticulitis with multiple abdominopelvic gas and fluid collections, measuring up to 5.7 cm and further described above. 2. Cholelithiasis without findings of acute cholecystitis. 3. 3.6 cm benign left adrenal adenoma. 4. Leiomyomatous uterus. 5. Asymmetric sclerosis of the iliac portion of the bilateral SI joints, as  can be seen with benign self-limiting osteitis condensans iliac.   11/07/2020 Imaging    EXAM: CT ABDOMEN AND PELVIS WITHOUT CONTRAST  IMPRESSION: Continued wall thickening is seen involving descending colon suggesting infectious or inflammatory colitis or perforated diverticulitis. There is an adjacent fluid collection measuring 7.0 x 5.0 cm consistent with abscess which is significantly enlarged compared to prior exam. Adjacent to the abscess, there is a severely thickened small bowel loop most consistent with secondary inflammation.   5.2 x 3.5 cm fluid collection is noted within the left psoas muscle consistent with abscess which is significantly enlarged compared to prior exam. 4.4 x 3.8 cm fluid collection consistent with abscess is noted in the left retroperitoneal region which is also enlarged compared to prior exam.   Fibroid uterus.   Cholelithiasis.   11/27/2020 Imaging   EXAM: CT ABDOMEN AND PELVIS WITHOUT CONTRAST  IMPRESSION: 1. Significantly interval decreased size of the previously visualized left retroperitoneal and left anterior mid abdominal fluid collections. There is persistent fat stranding and mural thickening of the mid descending colon at this level. 2. Unchanged leiomyomatous uterus. 3. Unchanged cholelithiasis.   12/11/2020 Imaging   EXAM: CT ABDOMEN AND PELVIS WITHOUT CONTRAST  IMPRESSION: 1. Stable inflammatory changes centered around the distal descending colon in the left lower abdomen. Pericolonic inflammatory changes have minimally changed since 11/27/2020. Small pocket of gas medial to the colon is probably associated with a fistula or small residual abscess collection. 2. Stable position of the two percutaneous drains. The more anterior drain may be extending through a portion of the small bowel. 3. Cholelithiasis. 4. Fibroid uterus. Cannot exclude an ovarian/adnexal cystic structure near the uterine fundus. 5. Left adrenal adenoma.   01/07/2021 Imaging   EXAM: CT ABDOMEN AND PELVIS WITH CONTRAST  IMPRESSION: 1. No new  abscesses identified. Similar degree of soft tissue thickening seen in the left pericolonic region. The degree of persistent soft tissues thickening in the pericolonic space further raises suspicions for malignancy. Further evaluation with colonoscopy should be performed. 2. Left retroperitoneal abscess drain unchanged in position. Anterior left abdominal drain again seen terminating within small bowel loop. 3. 2.6 cm mildly sclerotic lesion noted in the L2 vertebral body. Further evaluation with contrast enhanced lumbar spine MRI should be performed.   02/04/2021 Imaging   EXAM: CT ABDOMEN AND PELVIS WITH CONTRAST  IMPRESSION: No new abdominopelvic collections or abscess development in the 1 month interval.   Left anterior drain remains within a loop of small bowel, unchanged.   Left lateral abscess drain remains in the retroperitoneal space adjacent to the iliopsoas muscle with a small amount of residual fluid and air but no measurable collection.   Stable soft tissue prominence and pericolonic strandy edema/inflammation about the left descending colon compatible with residual diverticulitis/colitis. Difficult to exclude underlying transmural lesion.   04/01/2021 Imaging   EXAM: CT ABDOMEN AND PELVIS WITH CONTRAST  IMPRESSION: There appears to be significant enhancement and wall thickening involving the descending colon with some degree of traction and involvement of adjacent small bowel loops. This is consistent with the history of diverticulitis and perforation. Stable position of percutaneous drainage catheter is seen adjacent to left psoas muscle with no significant residual fluid remaining. The other percutaneous drainage catheter that was previously noted to be within small bowel loop on prior exam in the left lower quadrant, has significantly retracted and appears to be outside of the peritoneal space at this time.   Since the prior exam, there does appear to  be some  degree of rotation involving mesenteric vessels and structures in the right lower quadrant, suggesting partial volvulus or malrotation. There is the interval development of several lymph nodes in this area, most likely inflammatory or reactive in etiology. Mild amount of free fluid is also noted in the pelvis. However, no significant bowel wall thickening or dilatation is seen in this area. These results will be called to the ordering clinician or representative by the Radiologist Assistant, and communication documented in the PACS or zVision Dashboard.   Stable uterine fibroid.   Hepatic steatosis.   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   Cholelithiasis.   04/10/2021 Imaging   EXAM: CT ANGIOGRAPHY CHEST CT ABDOMEN AND PELVIS WITH CONTRAST  IMPRESSION: 1. Again seen are findings compatible with descending colon diverticulitis with perforation. Free air and inflammation have increased in the interval. 2. New lobulated enhancing fluid collection posterior to this segment of inflamed colon measuring 8.5 x 3.5 x 10.0 cm. This collection now invades the adjacent iliopsoas muscle as well as extends through the left lateral abdominal wall. The tip of the drainage catheter is in this collection. Findings are compatible with abscess. 3. Anterior left percutaneous drainage catheter tip has been pulled back and is now within the subcutaneous tissues. 4. Trace free fluid. 5. No pulmonary embolism.  No acute cardiopulmonary process. 6. Cholelithiasis. 7. Fatty infiltration of the liver.   04/15/2021 Definitive Surgery   FINAL MICROSCOPIC DIAGNOSIS:   A. SMALL BOWEL, RESECTION:  - Adenocarcinoma.  - No carcinoma identified in 1 lymph node.   B. PERFORATED LEFT COLON, RESECTION:  - Moderately differentiated colonic adenocarcinoma.  - Tumor extends into pericolonic adipose tissue, and is strongly  suggestive of invasion into small bowel.  See oncology table/comments.  - No carcinoma  identified in 4 lymph nodes (0/4).  - Tubular adenoma with high-grade dysplasia, 1.  - Tubular adenomas with low grade dysplasia, 3.   Comments: The size of the tumor is difficult to estimate secondary to the disrupted nature of the specimen, as well as the infiltrative nature of the tumor.  Tumor can be identified as definitely invading into the pericolonic adipose tissue (block B4), but given the extreme disruption of the tissue, and the involvement of the small bowel by what appears to be a colonic adenocarcinoma, I favor perforation of the large bowel with direct invasion into the small bowel.  Accordingly, I believe this is best regarded as a pT4b lesion.   ADDENDUM:  Mismatch Repair Protein (IHC)  SUMMARY INTERPRETATION: ABNORMAL  There is loss of the major MMR protein [name]6: This indicates a high probability that a hereditary germline mutation is present and referral to genetic counseling is warranted. It is recommended that the loss of protein expression be correlated with molecular based microsatellite instability testing.   IHC EXPRESSION RESULTS  TEST           RESULT  MLH1:          Preserved nuclear expression  [name]2:          Preserved nuclear expression  [name]6:          LOSS OF NUCLEAR EXPRESSION  PMS2:          Preserved nuclear expression    04/15/2021 Cancer Staging   Staging form: Colon and Rectum, AJCC 8th Edition - Pathologic stage from 04/15/2021: Stage IIC (pT4b, pN0, cM0) - Signed by Malachy Mood, MD on 06/12/2021 Stage prefix: Initial diagnosis Total positive nodes: 0 Histologic grading  system: 4 grade system Histologic grade (G): G2 Residual tumor (R): R0 - None   04/29/2021 Imaging   EXAM: CT ABDOMEN AND PELVIS WITH CONTRAST  IMPRESSION: Continued left retroperitoneal abscesses inferior to the left kidney, involving the left psoas muscle and left abdominal wall musculature. Overall size is decreased since prior study. Interval removal of left lower quadrant abscess  drainage catheter.   New fluid collection in the cul-de-sac and wrapping around the uterus concerning for abscess.   Cholelithiasis.   Small left pleural effusion.  Bibasilar atelectasis.   Stable left adrenal adenoma.  ADDENDUM: After discussing the case with Dr. Bryn Gulling in interventional radiology, it was noted that the fluid in the pelvis originally thought to be in the cul-de-sac is likely within the vagina, best seen on sagittal imaging. Recommend speculum exam.   Also, in the left lateral wall in the area of prior abscess in the lateral wall abdominal musculature, some of the abdomen soft tissue appears to be enhancing. While this could be infectious, cannot exclude tumor seeding in the left lateral abdominal wall, measuring 6.9 x 3.8 cm on image 64 of series 2.   06/16/2021 Imaging   EXAM: CT ABDOMEN AND PELVIS WITH CONTRAST  IMPRESSION: Increased size of bulky soft tissue masses involving the left psoas muscle and left lateral abdominal wall soft tissues, consistent with progressive metastatic disease.   Significant progression of multiple peritoneal masses throughout the abdomen and pelvis, consistent with peritoneal carcinomatosis.   New mild retroperitoneal lymphadenopathy, consistent with metastatic disease.   Stable uterine fibroid and left adrenal adenoma.   Cholelithiasis, without evidence of cholecystitis.   06/22/2021 - 08/19/2021 Chemotherapy   Patient is on Treatment Plan : COLORECTAL FOLFOX q14d x 6 months     06/22/2021 Tumor Marker   Patient's tumor was tested for the following markers: CEA. Results of the tumor marker test revealed 87.41.   07/10/2021 Pathology Results   FINAL MICROSCOPIC DIAGNOSIS:   A. PERITONEAL MASS, LEFT UPPER ABDOMINAL QUADRANT, BIOPSY:  -  Metastatic adenocarcinoma with necrosis, histologically consistent with colon primary.     Genetic Testing   Pathogenic variant in APC called c.1312+3A>G identified on the [employer]  CancerNext-Expanded+RNA panel. The report date is 07/16/2021. The remainder of testing was negative/normal.  The CancerNext-Expanded + RNAinsight gene panel offered by [employer] and includes sequencing and rearrangement analysis for the following 77 genes: IP, ALK, APC*, ATM*, AXIN2, BAP1, BARD1, BLM, BMPR1A, BRCA1*, BRCA2*, BRIP1*, CDC73, CDH1*,CDK4, CDKN1B, CDKN2A, CHEK2*, CTNNA1, DICER1, FANCC, FH, FLCN, GALNT12, KIF1B, LZTR1, MAX, MEN1, MET, MLH1*, [name]2*, [name]3, [name]6*, MUTYH*, NBN, NF1*, NF2, NTHL1, PALB2*, PHOX2B, PMS2*, POT1, PRKAR1A, PTCH1, PTEN*, RAD51C*, RAD51D*,RB1, RECQL, RET, SDHA, SDHAF2, SDHB, SDHC, SDHD, SMAD4, SMARCA4, SMARCB1, SMARCE1, STK11, SUFU, TMEM127, TP53*,TSC1, TSC2, VHL and XRCC2 (sequencing and deletion/duplication); EGFR, EGLN1, HOXB13, KIT, MITF, PDGFRA, POLD1 and POLE (sequencing only); EPCAM and GREM1 (deletion/duplication only).   08/28/2021 Imaging   EXAM: CT CHEST, ABDOMEN, AND PELVIS WITH CONTRAST  IMPRESSION: 1. Continued interval progression of multiple metastatic soft tissue nodules/masses in the abdomen and pelvis. 2. No evidence for metastatic disease in the chest. 3. Similar appearance of the subtle lesion in the L2 vertebral body, suspicious for metastatic involvement. 4. Cholelithiasis.   09/03/2021 -  Chemotherapy   Patient is on Treatment Plan : COLORECTAL Pembrolizumab (200) q21d     09/08/2021 - 02/01/2022 Chemotherapy   Patient is on Treatment Plan : COLORECTAL Pembrolizumab (200) q21d     01/08/2022 Imaging   CT CAP  IMPRESSION: 1. Mixed response to therapy. Interval decrease in size of the conglomerate multilobulated mass extending from the left psoas into the left posterior pararenal space through the left lateral abdominal wall along the tract of the previously placed percutaneous drainage catheter. Interval decrease in size of right adnexal mass and omental masses within the right lower quadrant of the abdomen. Interval development of peritoneal  carcinomatosis with omental caking within the left lower quadrant of the abdomen and left subdiaphragmatic region adjacent to the spleen and along the left pericolic gutter as well as development of ascites appearing loculated within the anterior peritoneum. 2. Interval development of mild left hydronephrosis secondary to stricturing of the mid left ureter. 3. Stable left adrenal metastasis. 4. Stable sclerotic lesion within the L2 vertebral body. No new lytic or blastic bone lesions are seen. 5. Cholelithiasis. 6. No evidence of intrathoracic metastatic disease. 7. 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 Moderate colonic stool burden. No evidence of obstruction.   03/15/2022 Imaging   IMPRESSION: 1. Status post sigmoid colon resection with left lower quadrant end colostomy. 2. Multicystic bilateral ovarian lesions are slightly increased in size. 3. Diminished, small volume of loculated appearing ascites throughout the abdomen and pelvis, with similar appearance of peritoneal thickening and nodularity throughout. 4. Heterogeneously calcified, conglomerate masses in the left retroperitoneum and abdominal wall are slightly diminished in size. 5. Left adrenal lesion not significantly changed. 6. Unchanged faintly sclerotic metastatic lesion of L2. 7. Constellation of findings is consistent with mixed response to treatment however with overall little interval change. 8. Minimal residual left hydronephrosis and proximal hydroureter, improved compared to prior examination. 9. Cholelithiasis. 10. Coronary artery disease, significantly advanced for patient age.   08/04/2022 Imaging    IMPRESSION: CT CHEST:   1. No developing mass lesion, fluid collection or lymph node enlargement in the thorax. Stable tiny 3 mm lung nodule. 2. Coronary artery calcifications. Please correlate for other coronary risk factors. 3. Chest  port   CT ABDOMEN AND PELVIS:   1. Extensive surgical changes identified. Left lower quadrant ostomy, diverting colostomy. Surgical changes along loops of small bowel in the right hemiabdomen. 2. Once again there are areas of nodular tissue extending along the left lateral abdominal wall, into the retroperitoneum which are similar to previous. The amount of adjacent fluid in these locations has improved. 3. No new mass lesion, fluid collection or lymph node enlargement in the abdomen or pelvis. 4. Gallstones. 5. Enlarged uterus with fibroids. Bilateral complex cystic areas are once again seen and based on appearance differential includes dilated fallopian tubes, hydrosalpinx versus true cystic lesions. Please correlate for any known history or prior workup 6. Limited evaluation for solid organ pathology metastatic disease without the advantage of IV contrast     05/04/2023 PET scan   PET - restaging - skull base to thigh  IMPRESSION: 1. Subcentimeter peritoneal nodules in the left abdomen have decreased in size and hypermetabolism from 04/26/2022, compatible with treated metastatic disease. 2. Chronic focal hypermetabolism associated with a gallbladder nodule. Malignancy cannot be excluded. 3. Small hypermetabolic to borderline hypermetabolic external iliac and left inguinal lymph nodes and minimally hypermetabolic small bilateral cervical lymph nodes, nonspecific. Metastatic disease not excluded. Recommend attention on follow-up. 4. Left adrenal adenoma. 5. Cystic right adnexal mass is inadequately characterized. Because this lesion is not adequately characterized, prompt [place] is recommended for further evaluation. Note: This recommendation does not apply to premenarchal patients and to  those with increased risk (genetic, family history, elevated tumor markers or other high-risk factors) of ovarian cancer. Reference: JACR 2020 Feb; 17(2):248-254 6. Left lower quadrant colostomy with a  parastomal hernia containing fat and fluid.   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 No developing mass lesion, fluid collection or lymph node enlargement in the thorax. Stable tiny 3 mm lung nodule. 2. Coronary artery calcifications. Please correlate for other coronary risk factors. 3. Chest port   CT ABDOMEN AND PELVIS:   1. Extensive surgical changes identified. Left lower quadrant ostomy, diverting colostomy. Surgical changes along loops of small bowel in the right hemiabdomen. 2. Once again there are areas of nodular tissue extending along the left lateral abdominal wall, into the retroperitoneum which are similar to previous. The amount of adjacent fluid in these locations has improved. 3. No new mass lesion, fluid collection or lymph node enlargement in the abdomen or pelvis. 4. Gallstones. 5. Enlarged uterus with fibroids. Bilateral complex cystic areas are once again seen and based on appearance differential includes dilated fallopian tubes, hydrosalpinx versus true cystic lesions. Please correlate for any known history or prior workup 6. Limited evaluation for solid organ pathology metastatic disease without the advantage of IV contrast        Discussed the use of AI scribe software for clinical note transcription with the patient, who gave verbal consent to proceed.  History of Present Illness   The patient, with a history of colon cancer, presents with joint pain. The pain is manageable and has not interfered with her ability to continue [facility] therapy. The patient has been on Keytruda for nearly two years and the plan is to continue for a total of two years. The patient recently had a PET scan, which appears to be unchanged from the previous  scan, indicating stable disease. The patient also reports abdominal cramps, which have been decreasing in intensity. She has tried various remedies, including heat and over-the-counter medications, with limited success.         All other systems were reviewed with the patient and are negative.  MEDICAL HISTORY:  Past Medical History:  Diagnosis Date   Allergy    seasonal   Anemia    Blood infection 1985   Blood transfusion without reported diagnosis    colon ca 04/2021   Diverticulitis    Family history of breast cancer    Family history of colon cancer    Family history of colon cancer    Family history of stomach cancer    Hypertension    Obesity    Sleep apnea     SURGICAL HISTORY: Past Surgical History:  Procedure Laterality Date   COLECTOMY WITH COLOSTOMY CREATION/HARTMANN PROCEDURE N/A 04/15/2021   Procedure: COLOSTOMY CREATION/HARTMANN PROCEDURE;  Surgeon: [name], MD;  Location: [facility];  Service: General;  Laterality: N/A;   IR CATHETER TUBE CHANGE  12/12/2020   IR CATHETER TUBE CHANGE  01/27/2021   IR CATHETER TUBE CHANGE  02/19/2021   IR CATHETER TUBE CHANGE  04/13/2021   IR IMAGING GUIDED PORT INSERTION  10/02/2021   IR RADIOLOGIST EVAL & MGMT  11/27/2020   IR RADIOLOGIST EVAL & MGMT  12/11/2020   IR RADIOLOGIST EVAL & MGMT  01/07/2021  IR RADIOLOGIST EVAL & MGMT  02/04/2021   IR SINUS/FIST TUBE CHK-NON GI  12/12/2020   IR SINUS/FIST TUBE CHK-NON GI  02/19/2021   LAPAROSCOPIC PARTIAL COLECTOMY N/A 04/15/2021   Procedure: LAPAROSCOPIC ASSISTED HARTMANN RESECTION;  Surgeon: [name], MD;  Location: [facility];  Service: General;  Laterality: N/A;    I have reviewed the social history and family history with the patient and they are unchanged from previous note.  ALLERGIES:  is allergic to chlorhexidine gluconate, shellfish allergy, and penicillins.  MEDICATIONS:  Current Outpatient Medications  Medication Sig Dispense Refill   acetaminophen-codeine  (TYLENOL #3) 300-30 MG tablet Take 1 tablet by mouth every 8 (eight) hours as needed for moderate pain (pain score 4-6). 30 tablet 0   ALPRAZolam (XANAX) 0.25 MG tablet Take 1 tablet (0.25 mg total) by mouth at bedtime as needed for anxiety. 30 tablet 0   amLODipine (NORVASC) 10 MG tablet TAKE 1 TABLET BY MOUTH EVERY DAY 90 tablet 3   diclofenac Sodium (VOLTAREN) 1 % GEL Apply 2 g topically 4 (four) times daily. 100 g 2   dicyclomine (BENTYL) 10 MG capsule Take 1 capsule (10 mg total) by mouth every 8 (eight) hours as needed for spasms. 20 capsule 0   furosemide (LASIX) 20 MG tablet TAKE 1 TABLET BY MOUTH EVERY DAY AS NEEDED 90 tablet 1   gabapentin (NEURONTIN) 300 MG capsule Take 1 capsule (300 mg total) by mouth at bedtime. 90 capsule 1   KLOR-CON M20 20 MEQ tablet TAKE 1 TABLET BY MOUTH EVERY DAY 90 tablet 1   lidocaine-prilocaine (EMLA) cream Apply 1 Application topically as needed. 30 g 3   methocarbamol (ROBAXIN-750) 750 MG tablet Take 1 tablet (750 mg total) by mouth every 8 (eight) hours as needed for muscle spasms. 60 tablet 0   Metoprolol Tartrate 75 MG TABS Take 1 tablet (75 mg total) by mouth 2 (two) times daily. 180 tablet 3   nitrofurantoin, macrocrystal-monohydrate, (MACROBID) 100 MG capsule Take 1 capsule (100 mg total) by mouth 2 (two) times daily. 10 capsule 0   POTASSIUM PO Take by mouth.     prochlorperazine (COMPAZINE) 10 MG tablet Take 1 tablet (10 mg total) by mouth every 8 (eight) hours as needed for nausea or vomiting. 30 tablet 0   sulfamethoxazole-trimethoprim (BACTRIM DS) 800-160 MG tablet      traZODone (DESYREL) 50 MG tablet TAKE 1-2 TABLETS BY MOUTH AT BEDTIME AS NEEDED FOR SLEEP. 180 tablet 1   triamcinolone cream (KENALOG) 0.1 % APPLY TO AFFECTED AREA TWICE DAILY AS NEEDED 30 g 0   valsartan (DIOVAN) 80 MG tablet Take 1 tablet (80 mg total) by mouth daily. 30 tablet 11   No current facility-administered medications for this visit.   Facility-Administered  Medications Ordered in Other Visits  Medication Dose Route Frequency Provider Last Rate Last Admin   fentaNYL (SUBLIMAZE) injection   Intravenous PRN Mir, Al Corpus, MD   50 mcg at 10/02/21 1436   midazolam (VERSED) injection   Intravenous PRN Mir, Al Corpus, MD   1 mg at 10/02/21 1436    PHYSICAL EXAMINATION: ECOG PERFORMANCE STATUS: 1 - Symptomatic but completely ambulatory  There were no vitals filed for this visit. Wt Readings from Last 3 Encounters:  08/23/23 (!) 348 lb 12.8 oz (158.2 kg)  08/11/23 (!) 339 lb 6.4 oz (154 kg)  06/30/23 (!) 329 lb 4.8 oz (149.4 kg)     GENERAL:alert, no distress and comfortable SKIN: skin color, texture, turgor  are normal, no rashes or significant lesions EYES: normal, Conjunctiva are pink and non-injected, sclera clear NECK: supple, thyroid normal size, non-tender, without nodularity LYMPH:  no palpable lymphadenopathy in the cervical, axillary  LUNGS: clear to auscultation and percussion with normal breathing effort HEART: regular rate & rhythm and no murmurs and no lower extremity edema ABDOMEN:abdomen soft, non-tender and normal bowel sounds Musculoskeletal:no cyanosis of digits and no clubbing  NEURO: alert & oriented x 3 with fluent speech, no focal motor/sensory deficits    LABORATORY DATA:  I have reviewed the data as listed    Latest Ref Rng & Units 09/01/2023    2:32 PM 08/11/2023    1:34 PM 07/21/2023    2:27 PM  CBC  WBC 4.0 - 10.5 K/uL 6.4  6.6  6.8   Hemoglobin 12.0 - 15.0 g/dL 16.1  09.6  04.5   Hematocrit 36.0 - 46.0 % 35.4  35.0  35.9   Platelets 150 - 400 K/uL 302  396  471         Latest Ref Rng & Units 09/01/2023    2:32 PM 08/11/2023    1:34 PM 07/21/2023    2:27 PM  CMP  Glucose 70 - 99 mg/dL 90  88  409   BUN 6 - 20 mg/dL 20  15  17    Creatinine 0.44 - 1.00 mg/dL 8.11  9.14  7.82   Sodium 135 - 145 mmol/L 137  137  138   Potassium 3.5 - 5.1 mmol/L 4.6  4.2  4.0   Chloride 98 - 111 mmol/L 107  109  109   CO2 22  - 32 mmol/L 23  24  25    Calcium 8.9 - 10.3 mg/dL 9.0  8.6  8.9   Total Protein 6.5 - 8.1 g/dL 7.4  7.0  7.4   Total Bilirubin 0.0 - 1.2 mg/dL 0.2  0.2  0.3   Alkaline Phos 38 - 126 U/L 41  44  53   AST 15 - 41 U/L 13  15  13    ALT 0 - 44 U/L 10  13  14        RADIOGRAPHIC STUDIES: I have personally reviewed the radiological images as listed and agreed with the findings in the report. No results found.    No orders of the defined types were placed in this encounter.  All questions were answered. The patient knows to call the clinic with any problems, questions or concerns. No barriers to learning was detected. The total time spent in the appointment was 30 minutes.     Malachy Mood, MD 09/01/2023

## 2023-09-01 NOTE — Assessment & Plan Note (Signed)
-  MMR loss of [name]6, [name] with high mutation burden, acquired BRCA mutation (+)  --Initially presented with diarrhea 11/04/20, found to have diverticulitis with perforation and also developed an abscess requiring 2 drains, multiple hospital stay  --she started FOLFOX on 06/22/21 but did not respond -she switched to [facility] on 09/08/21. She has been tolerating well overall but has developed joint pain which could be related, overall manageable. She is clinically doing better also with more energy and less abdominal pain   -her CEA has normalized on immunotherapy. -CT CAP on 03/15/22 again showed mixed response -she had PET scan on 04/26/2022 for evaluation of her ovarian cysts, and saw GYN Dr. [name], we decided to monitor it. -She tolerating Keytruda well, with mild joint pain.  She recently started workout at the gym, which has helped her joint pain. -restaging CT from 08/04/2022 showed stable disease in left abdomen and pelvis, no new lesions.  I personally reviewed her scan images, and discussed findings with patient.   -Will continue Keytruda  -repeated CT scan on January 24, 2023 showed continuous response, no new lesions.  -PET on 05/04/2023 showed no definitive evidence of progression, some mildly hypermetabolic nodes, will continue monitoring, plan to stop Keytruda in 09/2023 if no progression on next scan

## 2023-09-01 NOTE — Patient Instructions (Signed)

## 2023-09-04 NOTE — Assessment & Plan Note (Signed)
 BMI 61.  She is aware of 19lb weight gain since January 2025. She is encouraged to decrease her intake of sugary foods/drinks and to gradually increase her daily activity level.

## 2023-09-04 NOTE — Assessment & Plan Note (Signed)
 Chronic, she does not wish to consider b/l breast reduction at this time. This is likely contributing to her thoracic back pain.

## 2023-09-04 NOTE — Assessment & Plan Note (Signed)
 Chronic, fair control.  She is currently on valsartan 80mg , amlodipine 10mg  daily and metoprolol tartrate 75mg  twice daily. Importance of following a low sodium diet was discussed with the patient.

## 2023-09-04 NOTE — Assessment & Plan Note (Signed)
 Chronic, as per Oncology, this could be a result of Keytruda. I will check an arthritis panel. She is encouraged to follow an anti-inflammatory diet. I will make further recommendations once her labs are available for review.

## 2023-09-04 NOTE — Assessment & Plan Note (Signed)
 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 She has tried Salonpas lidocaine patches without significant relief of her sx. She will continue with rx gabapentin 300mg  to use nightly. She would likely benefit from PT to help with core strength.

## 2023-09-05 ENCOUNTER — Encounter: Payer: Self-pay | Admitting: Internal Medicine

## 2023-09-12 ENCOUNTER — Encounter: Payer: Self-pay | Admitting: Student

## 2023-09-12 ENCOUNTER — Ambulatory Visit: Attending: Student | Admitting: Student

## 2023-09-12 ENCOUNTER — Encounter: Payer: Self-pay | Admitting: Hematology

## 2023-09-12 ENCOUNTER — Other Ambulatory Visit: Payer: Self-pay | Admitting: Nurse Practitioner

## 2023-09-12 ENCOUNTER — Ambulatory Visit: Payer: Managed Care, Other (non HMO) | Admitting: Student

## 2023-09-12 VITALS — BP 130/110 | HR 89 | Ht 63.0 in | Wt 345.0 lb

## 2023-09-12 DIAGNOSIS — I2510 Atherosclerotic heart disease of native coronary artery without angina pectoris: Secondary | ICD-10-CM | POA: Diagnosis not present

## 2023-09-12 DIAGNOSIS — E785 Hyperlipidemia, unspecified: Secondary | ICD-10-CM | POA: Diagnosis not present

## 2023-09-12 DIAGNOSIS — C786 Secondary malignant neoplasm of retroperitoneum and peritoneum: Secondary | ICD-10-CM

## 2023-09-12 DIAGNOSIS — G4733 Obstructive sleep apnea (adult) (pediatric): Secondary | ICD-10-CM

## 2023-09-12 DIAGNOSIS — R600 Localized edema: Secondary | ICD-10-CM | POA: Diagnosis not present

## 2023-09-12 DIAGNOSIS — I10 Essential (primary) hypertension: Secondary | ICD-10-CM | POA: Diagnosis not present

## 2023-09-12 DIAGNOSIS — C187 Malignant neoplasm of sigmoid colon: Secondary | ICD-10-CM

## 2023-09-12 DIAGNOSIS — Z87898 Personal history of other specified conditions: Secondary | ICD-10-CM

## 2023-09-12 MED ORDER — ASPIRIN 81 MG PO TBEC: 81.0000 mg | DELAYED_RELEASE_TABLET | Freq: Every day | ORAL | Status: AC

## 2023-09-12 MED ORDER — VALSARTAN 160 MG PO TABS: 160.0000 mg | ORAL_TABLET | Freq: Every day | ORAL | 11 refills | Status: DC

## 2023-09-12 MED ORDER — ATORVASTATIN CALCIUM 40 MG PO TABS: 40.0000 mg | ORAL_TABLET | Freq: Every day | ORAL | 6 refills | Status: DC

## 2023-09-12 NOTE — Progress Notes (Signed)
 Cardiology Office Note:    Date:  09/12/2023   ID:  Mikayla Rios, DOB 10-15-1985, MRN [id number]  PCP:  [name], MD  Cardiologist:  Mikayla Ishikawa, MD     Referring MD: [name], MD   Chief Complaint: routine follow-up of palpitations/ tachycardia  History of Present Illness:    Mikayla Rios is a 38 y.o. female with a history of coronary artery calcifications noted on prior CTs, palpitations/tachycardia with no significant arrhythmias noted on monitor in 11/2019 hypertension, hyperlipidemia, obstructive sleep apnea, perforated diverticulitis with retroperitoneal abscess s/p colectomy and end colostomy in 04/2021, colon cancer, anemia, chronic pain, and morbid obesity (BMI 61) who is followed by Dr. Gaynelle Arabian and presents today for routine follow-up.   Patient was referred to Dr. Bjorn Pippin in 10/2019 for further evaluation of tachycardia with episodes of what patient described as heart racing a couple times per day. She also reported some dyspnea with exertion at that time. Echo and ZIO monitor were ordered for further evaluation. Echo showed LVEF of 35% with  mild LVH but no regional wall motion abnormalities and normal diastolic parameters. ZIO monitor showed rare PAC/PVCs but no significant rhythm.   She had multiple admissions in 2022 and 2023 for noncardiac issues. She was admitted for septic shock secondary to chronic perperforated colon with retroperitoneal abscess and partial small bowel obstruction during one of these admissions. 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 Echo during that admission showed LVEF of 60-65% with no regional wall motion abnormalities and no significant valvular disease.   She was last seen by me in 12/2021 at which time she reported occasional episodes of dizziness and near syncope that seemed to be related to drops in her BP. She was advised to keep a BP/HR log for 2 weeks to  help determine whether any medications adjustments needed to be made.   Patient presents today for follow-up.  She denies any recurrent tachycardia/palpitations. No chest pain, shortness of breath, orthopnea, or PND.  She has had some lower extremity edema (mostly in her feet and ankles) for the past 4 months.  She has as needed Lasix but states she has not been taking this because she was told she could not take it with the valsartan that she was recently prescribed.  She does report intermittent lightheadedness/dizziness.  This is sometimes positional but not always.  She has it a lot when she is getting out of the shower.  No syncope.  Her biggest complaint today is her blood pressure.  She states her blood pressure has been as high as 160s/90s at home.  Her PCP started her on Valsartan a couple months ago but BP remains elevated.  In regards to her cancer, she states things are going relatively well.  She is on Keytruda, which she gets every 3 weeks, and her cancer is shrinking.  She is scheduled to have repeat scans soon.  EKGs/Labs/Other Studies Reviewed:    The following studies were reviewed:  Zio Monitor 10/23/2019 to 10/26/2019: No significant arrhythmias detected   3 days of data recorded on Zio monitor. Patient had a min HR of 55 bpm, max HR of 132 bpm, and avg HR of 81 bpm. Predominant underlying rhythm was Sinus Rhythm. No VT, SVT, atrial fibrillation, high degree block, or pauses noted. Isolated atrial and ventricular ectopy was rare (<1%). There were 0 triggered events. No significant arrhythmias detected. _______________   Echocardiogram 04/14/2021:  Impressions:  1. Left ventricular ejection fraction, by estimation, is 60 to 65%. The  left ventricle has normal function. The left ventricle has no regional  wall motion abnormalities. Indeterminate diastolic filling due to E-A  fusion.   2. Right ventricular systolic function is normal. The right ventricular  size is normal.   3.  The mitral valve is normal in structure. No evidence of mitral valve  regurgitation. No evidence of mitral stenosis.   4. The aortic valve is normal in structure. Aortic valve regurgitation is  not visualized. No aortic stenosis is present.   5. The inferior vena cava is normal in size with greater than 50%  respiratory variability, suggesting right atrial pressure of 3 mmHg.  EKG:  EKG ordered today.   EKG Interpretation Date/Time:  Monday September 12 2023 16:17:43 EDT Ventricular Rate:  78 PR Interval:  204 QRS Duration:  94 QT Interval:  400 QTC Calculation: 456 R Axis:   63  Text Interpretation: Normal sinus rhythm No acute ischemic changes. Confirmed by Mikayla Rios [phone]) on 09/12/2023 4:19:52 PM    Recent Labs: 08/11/2023: TSH 1.833 09/01/2023: ALT 10; BUN 20; Creatinine 0.71; Hemoglobin 11.4; Platelet Count 302; Potassium 4.6; Sodium 137  Recent Lipid Panel    Component Value Date/Time   CHOL 205 (H) 11/30/2022 1726   TRIG 121 11/30/2022 1726   HDL 60 11/30/2022 1726   CHOLHDL 3.4 11/30/2022 1726   LDLCALC 124 (H) 11/30/2022 1726    Physical Exam:    Vital Signs: BP (!) 130/110   Pulse 89   Ht 5\' 3"  (1.6 m)   Wt (!) 345 lb (156.5 kg)   SpO2 97%   BMI 61.11 kg/m     Wt Readings from Last 3 Encounters:  09/12/23 (!) 345 lb (156.5 kg)  08/23/23 (!) 348 lb 12.8 oz (158.2 kg)  08/11/23 (!) 339 lb 6.4 oz (154 kg)     General: 38 y.o. morbidly obese African-American female in no acute distress. HEENT: Normocephalic and atraumatic. Sclera clear.  Neck: Supple. No carotid bruits. JVD difficult to assess body habitus. Heart: RRR. Distinct S1 and S2. No murmurs, gallops, or rubs.  Lungs: No increased work of breathing. Clear to ausculation bilaterally. No wheezes, rhonchi, or rales.  Extremities: Mild lower extremity edema bilaterally. Skin: Warm and dry. Neuro: No focal deficits. Psych: Normal affect. Responds appropriately.   Assessment:    1. Coronary  artery calcification   2. History of palpitations   3. Primary hypertension   4. Hyperlipidemia, unspecified hyperlipidemia type   5. Lower extremity edema   6. Obstructive sleep apnea   7. Morbid obesity (HCC)     Plan:    Coronary Calcifications Prior CT scan in 10/2021 showed mild coronary calcifications. - No chest pain or anginal equivalent symptoms.  - Not currently on aspirin or a statin.  - Recommend starting Aspirin 81mg  daily and Lipitor 40mg  daily.    History of Palpitations/ Sinus Tachycardia Zio monitor in 10/2019 showed rare PACs/PVCs but no significant arrhythmias.  - Well controlled on higher dose of beta-blocker. No recent palpitations. - Continue Lopressor 75mg  twice daily.   Hypertension BP elevated in the office. Initially 130/110 and then 140/106 on my personal recheck at the end of visit.  - Current medications: Amlodipine 10mg  daily, Valsartan 80mg  daily, and Lopressor 75mg  twice daily. - Will increase Valsartan to 160mg  daily.  - Patient is already scheduled to have a CMET checked at the [facility] next week on  09/22/2023. Will follow-up on this.   Hyperlipidemia Lipid panel in 11/2022: Total Cholesterol 205, Triglycerides 121, HDL 60, LDL 124. LDL goal <70 given coronary artery calcifications.  - Will start Lipitor 40mg  daily.  - Will repeat lipid panel in 2-3 months. She gets a CMET checked at the [facility] every 3 weeks.   Lower Extremity Edema She reports some lower extremity edema over the last 4 months.  - Stable today.  - Continue Lasix 20mg  daily as needed for worsening edema. OK to take this with Valsartan.  Obstructive Sleep Apnea She has a history of OSA but has not used her CPAP machine in 2 years.  - Discussed that untreated sleep apnea can lead to difficult to control hypertension. - Will order home Itmar sleep study.  Morbid Obesity  BMI 61.11.  - She would benefit from weight loss. Did not get a chance to discuss this today.    Disposition: Follow up in 3 months.   Signed, Mikayla Parker, Mikayla Rios  09/12/2023 4:58 PM    [facility]

## 2023-09-12 NOTE — Patient Instructions (Addendum)
 Medication Instructions:  INCREASE VALSARTAN 160MG  DAILY START ATORVASTATIN 40MG  DAILY START ASPIRIN 81MG  DAILY *If you need a refill on your cardiac medications before your next appointment, please call your pharmacy*  Lab Work: FASTING LIPID PANEL IN 3 MONTHS  Testing/Procedures: NONE  Follow-Up: At [facility], you and your health needs are our priority.  As part of our continuing mission to provide you with exceptional heart care, our providers are all part of one team.  This team includes your primary Cardiologist (physician) and Advanced Practice Providers or APPs (Physician Assistants and Nurse Practitioners) who all work together to provide you with the care you need, when you need it.  Your next appointment:   3 month(s)-IN THE AM  Provider:   [name], MD or Marjie Skiff, PA-C          We recommend signing up for the patient portal called "MyChart".  Sign up information is provided on this After Visit Summary.  MyChart is used to connect with patients for Virtual Visits (Telemedicine).  Patients are able to view lab/test results, encounter notes, upcoming appointments, etc.  Non-urgent messages can be sent to your provider as well.   To learn more about what you can do with MyChart, go to [email or web address].   Other Instructions TAKE YOUR BLOOD PRESSURE AND HEART RATE FOR 2 WEEKS  WatchPAT?  Is a FDA cleared portable home sleep study test that uses a watch and 3 points of contact to monitor 7 different channels, including your heart rate, oxygen saturations, body position, snoring, and chest motion.  The study is easy to use from the comfort of your own home and accurately detect sleep apnea.  Before bed, you attach the chest sensor, attached the sleep apnea bracelet to your nondominant hand, and attach the finger probe.  After the study, the raw data is downloaded from the watch and scored for apnea events.   For more information:  [email or web address]  Patient Testing Instructions:  Do not put battery into the device until bedtime when you are ready to begin the test. Please call the support number if you need assistance after following the instructions below: 24 hour support line- [phone] or [employer] support at [phone] (option 2)  Download the IntelWatchPAT One" app through the google play store or App Store  Be sure to turn on or enable access to bluetooth in settlings on your smartphone/ device  Make sure no other bluetooth devices are on and within the vicinity of your smartphone/ device and WatchPAT watch during testing.  Make sure to leave your smart phone/ device plugged in and charging all night.  When ready for bed:  Follow the instructions step by step in the WatchPAT One App to activate the testing device. For additional instructions, including video instruction, visit the WatchPAT One video on Youtube. You can search for WatchPat One within Youtube (video is 4 minutes and 18 seconds) or enter: https://youtube/watch?[email or web address]_vbiwxE Please note: You will be prompted to enter a Pin to connect via bluetooth when starting the test. The PIN will be assigned to you when you receive the test.  The device is disposable, but it recommended that you retain the device until you receive a call letting you know the study has been received and the results have been interpreted.  We will let you know if the study did not transmit to [place] properly after the test is completed. You do not need to call us to confirm the receipt of the  test.  Please complete the test within 48 hours of receiving PIN.   Frequently Asked Questions:  What is Watch Dennie Bible one?  A single use fully disposable home sleep apnea testing device and will not need to be returned after completion.  What are the requirements to use WatchPAT one?  The be able to have a successful watchpat one sleep study, you should have your Watch pat one  device, your smart phone, watch pat one app, your PIN number and Internet access What type of phone do I need?  You should have a smart phone that uses Android 5.1 and above or any Iphone with IOS 10 and above How can I download the WatchPAT one app?  Based on your device type search for WatchPAT one app either in google play for android devices or APP store for Iphone's Where will I get my PIN for the study?  Your PIN will be provided by your physician's office. It is used for authentication and if you lose/forget your PIN, please reach out to your providers office.  I do not have Internet at home. Can I do WatchPAT one study?  WatchPAT One needs Internet connection throughout the night to be able to transmit the sleep data. You can use your home/local internet or your cellular's data package. However, it is always recommended to use home/local Internet. It is estimated that between 20MB-30MB will be used with each study.However, the application will be looking for space in the phone to start the study.  What happens if I lose internet or bluetooth connection?  During the internet disconnection, your phone will not be able to transmit the sleep data. All the data, will be stored in your phone. As soon as the internet connection is back on, the phone will being sending the sleep data. During the bluetooth disconnection, WatchPAT one will not be able to to send the sleep data to your phone. Data will be kept in the [facility] one until two devices have bluetooth connection back on. As soon as the connection is back on, WatchPAT one will send the sleep data to the phone.  How long do I need to wear the WatchPAT one?  After you start the study, you should wear the device at least 6 hours.  How far should I keep my phone from the device?  During the night, your phone should be within 15 feet.  What happens if I leave the room for restroom or other reasons?  Leaving the room for any reason will not  cause any problem. As soon as your get back to the room, both devices will reconnect and will continue to send the sleep data. Can I use my phone during the sleep study?  Yes, you can use your phone as usual during the study. But it is recommended to put your watchpat one on when you are ready to go to bed.  How will I get my study results?  A soon as you completed your study, your sleep data will be sent to the provider. They will then share the results with you when they are ready.         1st Floor: - Lobby - Registration  - Pharmacy  - Lab - Cafe  2nd Floor: - PV Lab - Diagnostic Testing (echo, CT, nuclear med)  3rd Floor: - Vacant  4th Floor: - TCTS (cardiothoracic surgery) - AFib Clinic - Structural Heart Clinic - Vascular Surgery  - Vascular Ultrasound  5th Floor: -  [facility] (general and EP) - Clinical Pharmacy for coumadin, hypertension, lipid, weight-loss medications, and med management appointments    Valet parking services will be available as well.

## 2023-09-16 ENCOUNTER — Other Ambulatory Visit: Payer: Self-pay | Admitting: Cardiology

## 2023-09-21 ENCOUNTER — Other Ambulatory Visit: Payer: Self-pay | Admitting: Hematology

## 2023-09-21 DIAGNOSIS — C187 Malignant neoplasm of sigmoid colon: Secondary | ICD-10-CM

## 2023-09-21 DIAGNOSIS — C786 Secondary malignant neoplasm of retroperitoneum and peritoneum: Secondary | ICD-10-CM

## 2023-09-21 NOTE — Assessment & Plan Note (Addendum)
-  MMR loss of [name]6, [name] with high mutation burden, acquired BRCA mutation (+)  --Initially presented with diarrhea 11/04/20, found to have diverticulitis with perforation and also developed an abscess requiring 2 drains, multiple hospital stay  --she started FOLFOX on 06/22/21 but did not respond -she switched to Keytruda on 09/08/21. She has been tolerating well overall but has developed joint pain which could be related, overall manageable. She is clinically doing better also with more energy and less abdominal pain   -her CEA has normalized on immunotherapy. -CT CAP on 03/15/22 again showed mixed response -she had PET scan on 04/26/2022 for evaluation of her ovarian cysts, and saw GYN Dr. Daisey Dryer, we decided to monitor it. -She tolerating Keytruda well, with mild joint pain.  She recently started workout at the gym, which has helped her joint pain. -restaging CT from 08/04/2022 showed stable disease in left abdomen and pelvis, no new lesions.  I personally reviewed her scan images, and discussed findings with patient.   -Will continue Keytruda  -repeated CT scan on January 24, 2023 showed continuous response, no new lesions.  -PET on 05/04/2023 showed no definitive evidence of progression, some mildly hypermetabolic nodes, will continue monitoring, plan to stop Keytruda in 09/2023 if no progression on next scan  -PET 08/29/2023 showed similar tiny left pericolic hypermetabolic nodules, most consistent with metastatic disease, no other definitive evidence of residual disease

## 2023-09-22 ENCOUNTER — Inpatient Hospital Stay: Attending: Hematology

## 2023-09-22 ENCOUNTER — Inpatient Hospital Stay

## 2023-09-22 ENCOUNTER — Encounter: Payer: Self-pay | Admitting: Hematology

## 2023-09-22 ENCOUNTER — Inpatient Hospital Stay: Attending: Hematology | Admitting: Hematology

## 2023-09-22 VITALS — BP 138/84 | HR 79 | Temp 97.6°F | Resp 18 | Wt 346.6 lb

## 2023-09-22 DIAGNOSIS — K8020 Calculus of gallbladder without cholecystitis without obstruction: Secondary | ICD-10-CM | POA: Diagnosis not present

## 2023-09-22 DIAGNOSIS — C187 Malignant neoplasm of sigmoid colon: Secondary | ICD-10-CM

## 2023-09-22 DIAGNOSIS — Z933 Colostomy status: Secondary | ICD-10-CM | POA: Insufficient documentation

## 2023-09-22 DIAGNOSIS — M2550 Pain in unspecified joint: Secondary | ICD-10-CM | POA: Diagnosis not present

## 2023-09-22 DIAGNOSIS — K760 Fatty (change of) liver, not elsewhere classified: Secondary | ICD-10-CM | POA: Insufficient documentation

## 2023-09-22 DIAGNOSIS — Z800 Family history of malignant neoplasm of digestive organs: Secondary | ICD-10-CM | POA: Insufficient documentation

## 2023-09-22 DIAGNOSIS — R911 Solitary pulmonary nodule: Secondary | ICD-10-CM | POA: Diagnosis not present

## 2023-09-22 DIAGNOSIS — G4730 Sleep apnea, unspecified: Secondary | ICD-10-CM | POA: Insufficient documentation

## 2023-09-22 DIAGNOSIS — C7951 Secondary malignant neoplasm of bone: Secondary | ICD-10-CM | POA: Insufficient documentation

## 2023-09-22 DIAGNOSIS — Z7982 Long term (current) use of aspirin: Secondary | ICD-10-CM | POA: Diagnosis not present

## 2023-09-22 DIAGNOSIS — D3502 Benign neoplasm of left adrenal gland: Secondary | ICD-10-CM | POA: Insufficient documentation

## 2023-09-22 DIAGNOSIS — J90 Pleural effusion, not elsewhere classified: Secondary | ICD-10-CM | POA: Diagnosis not present

## 2023-09-22 DIAGNOSIS — C7972 Secondary malignant neoplasm of left adrenal gland: Secondary | ICD-10-CM | POA: Insufficient documentation

## 2023-09-22 DIAGNOSIS — Z79899 Other long term (current) drug therapy: Secondary | ICD-10-CM | POA: Insufficient documentation

## 2023-09-22 DIAGNOSIS — D500 Iron deficiency anemia secondary to blood loss (chronic): Secondary | ICD-10-CM

## 2023-09-22 DIAGNOSIS — D259 Leiomyoma of uterus, unspecified: Secondary | ICD-10-CM | POA: Insufficient documentation

## 2023-09-22 DIAGNOSIS — Z1501 Genetic susceptibility to malignant neoplasm of breast: Secondary | ICD-10-CM | POA: Insufficient documentation

## 2023-09-22 DIAGNOSIS — K6819 Other retroperitoneal abscess: Secondary | ICD-10-CM | POA: Insufficient documentation

## 2023-09-22 DIAGNOSIS — I2510 Atherosclerotic heart disease of native coronary artery without angina pectoris: Secondary | ICD-10-CM | POA: Diagnosis not present

## 2023-09-22 DIAGNOSIS — K5720 Diverticulitis of large intestine with perforation and abscess without bleeding: Secondary | ICD-10-CM | POA: Insufficient documentation

## 2023-09-22 DIAGNOSIS — Z5112 Encounter for antineoplastic immunotherapy: Secondary | ICD-10-CM | POA: Diagnosis not present

## 2023-09-22 DIAGNOSIS — Z452 Encounter for adjustment and management of vascular access device: Secondary | ICD-10-CM

## 2023-09-22 DIAGNOSIS — C786 Secondary malignant neoplasm of retroperitoneum and peritoneum: Secondary | ICD-10-CM

## 2023-09-22 LAB — CBC WITH DIFFERENTIAL (CANCER CENTER ONLY)
Abs Immature Granulocytes: 0.03 10*3/uL (ref 0.00–0.07)
Basophils Absolute: 0.1 10*3/uL (ref 0.0–0.1)
Basophils Relative: 1 %
Eosinophils Absolute: 0.1 10*3/uL (ref 0.0–0.5)
Eosinophils Relative: 2 %
HCT: 35.4 % — ABNORMAL LOW (ref 36.0–46.0)
Hemoglobin: 11.6 g/dL — ABNORMAL LOW (ref 12.0–15.0)
Immature Granulocytes: 0 %
Lymphocytes Relative: 29 %
Lymphs Abs: 1.9 10*3/uL (ref 0.7–4.0)
MCH: 26.4 pg (ref 26.0–34.0)
MCHC: 32.8 g/dL (ref 30.0–36.0)
MCV: 80.5 fL (ref 80.0–100.0)
Monocytes Absolute: 0.5 10*3/uL (ref 0.1–1.0)
Monocytes Relative: 8 %
Neutro Abs: 4.1 10*3/uL (ref 1.7–7.7)
Neutrophils Relative %: 60 %
Platelet Count: 453 10*3/uL — ABNORMAL HIGH (ref 150–400)
RBC: 4.4 MIL/uL (ref 3.87–5.11)
RDW: 16 % — ABNORMAL HIGH (ref 11.5–15.5)
WBC Count: 6.8 10*3/uL (ref 4.0–10.5)
nRBC: 0 % (ref 0.0–0.2)

## 2023-09-22 LAB — CMP (CANCER CENTER ONLY)
ALT: 12 U/L (ref 0–44)
AST: 14 U/L — ABNORMAL LOW (ref 15–41)
Albumin: 4.1 g/dL (ref 3.5–5.0)
Alkaline Phosphatase: 42 U/L (ref 38–126)
Anion gap: 5 (ref 5–15)
BUN: 21 mg/dL — ABNORMAL HIGH (ref 6–20)
CO2: 25 mmol/L (ref 22–32)
Calcium: 8.6 mg/dL — ABNORMAL LOW (ref 8.9–10.3)
Chloride: 109 mmol/L (ref 98–111)
Creatinine: 0.76 mg/dL (ref 0.44–1.00)
GFR, Estimated: >60 mL/min (ref >60)
Glucose, Bld: 94 mg/dL (ref 70–99)
Potassium: 4.3 mmol/L (ref 3.5–5.1)
Sodium: 139 mmol/L (ref 135–145)
Total Bilirubin: 0.3 mg/dL (ref 0.0–1.2)
Total Protein: 7.3 g/dL (ref 6.5–8.1)

## 2023-09-22 LAB — CEA (ACCESS): CEA (CHCC): <1 ng/mL (ref 0.00–5.00)

## 2023-09-22 LAB — FERRITIN: Ferritin: 10 ng/mL — ABNORMAL LOW (ref 11–307)

## 2023-09-22 MED ORDER — SODIUM CHLORIDE 0.9 % IV SOLN: Freq: Once | INTRAVENOUS | Status: AC

## 2023-09-22 MED ORDER — SODIUM CHLORIDE 0.9% FLUSH
10.0000 mL | Freq: Once | INTRAVENOUS | Status: AC
  Administered 2023-09-22: 10 mL

## 2023-09-22 MED ORDER — SODIUM CHLORIDE 0.9 % IV SOLN
200.0000 mg | Freq: Once | INTRAVENOUS | Status: AC
  Administered 2023-09-22: 200 mg via INTRAVENOUS
  Filled 2023-09-22: qty 200

## 2023-09-22 MED ORDER — HEPARIN SOD (PORK) LOCK FLUSH 100 UNIT/ML IV SOLN
500.0000 [IU] | Freq: Once | INTRAVENOUS | Status: AC | PRN
Start: 2023-09-22 — End: 2023-09-22
  Administered 2023-09-22: 500 [IU]

## 2023-09-22 MED ORDER — SODIUM CHLORIDE 0.9% FLUSH
10.0000 mL | INTRAVENOUS | Status: DC | PRN
  Administered 2023-09-22: 10 mL

## 2023-09-22 NOTE — Progress Notes (Signed)
 Per Maryalice Smaller MD, ok to proceed with CMP from 09/01/23 for treatment today due to lab delays.

## 2023-09-22 NOTE — Progress Notes (Signed)
 [facility]   [phone] [phone]   Clinic Follow up Note   Patient Care Team: [name], MD as PCP - General (Internal Medicine) [name], MD as PCP - Cardiology (Cardiology) [name], [name] as Physician Assistant (Emergency Medicine) Malachy Mood, MD as Consulting Physician (Oncology) [name], MD as Consulting Physician (General Surgery) [name], [name], MD as Consulting Physician (Neurology) [name], [name], MD as Consulting Physician (Obstetrics and Gynecology) [name], MD as Consulting Physician (Gastroenterology) [name], [name], NP as Nurse Practitioner (Nurse Practitioner)  Date of Service:  09/22/2023  CHIEF COMPLAINT: f/u of colon cancer   CURRENT THERAPY:  Keytruda every 3 weeks  Oncology History   Malignant neoplasm of sigmoid colon [facility]) -MMR loss of [name]6, [facility] with high mutation burden, acquired BRCA mutation (+)  --Initially presented with diarrhea 11/04/20, found to have diverticulitis with perforation and also developed an abscess requiring 2 drains, multiple hospital stay  --she started FOLFOX on 06/22/21 but did not respond -she switched to [facility] on 09/08/21. She has been tolerating well overall but has developed joint pain which could be related, overall manageable. She is clinically doing better also with more energy and less abdominal pain   -her CEA has normalized on immunotherapy. -CT CAP on 03/15/22 again showed mixed response -she had PET scan on 04/26/2022 for evaluation of her ovarian cysts, and saw GYN Dr. [name], we decided to monitor it. -She tolerating Keytruda well, with mild joint pain.  She recently started workout at the gym, which has helped her joint pain. -restaging CT from 08/04/2022 showed stable disease in left abdomen and pelvis, no new lesions.  I personally reviewed her scan images, and discussed findings with patient.   -Will continue  Keytruda  -repeated CT scan on January 24, 2023 showed continuous response, no new lesions.  -PET on 05/04/2023 showed no definitive evidence of progression, some mildly hypermetabolic nodes, will continue monitoring, plan to stop Keytruda in 09/2023 if no progression on next scan  -PET 08/29/2023 showed similar tiny left pericolic hypermetabolic nodules, most consistent with metastatic disease, no other definitive evidence of residual disease    Assessment & Plan Metastatic colon cancer She is undergoing treatment with Keytruda for metastatic colon cancer for nearly two years. Recent PET/CT scan reveals a tiny left periclonic hypermetabolic nodule, consistent with metastatic disease, and nondependent increased gallbladder hypermetabolism likely corresponding to a polyp. No other new metastatic sites are identified. She experiences joint pain as a side effect of Keytruda, managed with Tylenol #3. She is concerned about cancer recurrence if treatment is stopped. A Guardian blood test is proposed to check for active tumor DNA. If negative, consider continuing Keytruda for another 3-4 months and then repeat the scan. Discontinuation of Rande Lawman will be discussed if subsequent scans show no changes or are negative. - Proceed with Keytruda infusion today. - Order a Guardian blood test to check for active tumor DNA. - If the blood test is negative, consider continuing Keytruda for another 3-4 months and then repeat the scan. - Discuss potential discontinuation of Keytruda if subsequent scans show no changes or are negative.  Joint pain She reports joint pain, particularly in the knees, hands, and arms, as a side effect of Keytruda. The pain is effectively managed with Tylenol #3. - Continue Tylenol #3 as needed for joint pain.  Fibroid She has a known fibroid, but no specific issues related to it were discussed during this visit.  Plan -PET reviewed  -  will check GuardantReveal on next lab draw in 3  weeks -proceed Keytruda today and continue every 3 weeks    SUMMARY OF ONCOLOGIC HISTORY: Oncology History Overview Note   Cancer Staging  Malignant neoplasm of sigmoid colon [facility]) Staging form: Colon and Rectum, AJCC 8th Edition - Pathologic stage from 04/15/2021: Stage IIC (pT4b, pN0, cM0) - Signed by [name] [place], MD on 06/12/2021    Malignant neoplasm of sigmoid colon [facility])   Initial Diagnosis   Malignant neoplasm of sigmoid colon (HCC)   11/04/2020 Imaging   EXAM: CT ABDOMEN AND PELVIS WITHOUT CONTRAST  IMPRESSION: 1. Perforating descending colonic diverticulitis with multiple abdominopelvic gas and fluid collections, measuring up to 5.7 cm and further described above. 2. Cholelithiasis without findings of acute cholecystitis. 3. 3.6 cm benign left adrenal adenoma. 4. Leiomyomatous uterus. 5. 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   11/07/2020 Imaging   EXAM: CT ABDOMEN AND PELVIS WITHOUT CONTRAST  IMPRESSION: Continued wall thickening is seen involving descending colon suggesting infectious or inflammatory colitis or perforated diverticulitis. There is an adjacent fluid collection measuring 7.0 x 5.0 cm consistent with abscess which is significantly enlarged compared to prior exam. Adjacent to the abscess, there is a severely thickened small bowel loop most consistent with secondary inflammation.   5.2 x 3.5 cm fluid collection is noted within the left psoas muscle consistent with abscess which is significantly enlarged compared to prior exam. 4.4 x 3.8 cm fluid collection consistent with abscess is noted in the left retroperitoneal region which is also enlarged compared to prior exam.   Fibroid uterus.   Cholelithiasis.   11/27/2020 Imaging   EXAM: CT ABDOMEN AND PELVIS WITHOUT CONTRAST  IMPRESSION: 1. Significantly interval decreased size of the previously visualized  left retroperitoneal and left anterior mid abdominal fluid collections. There is persistent fat stranding and mural thickening of the mid descending colon at this level. 2. Unchanged leiomyomatous uterus. 3. Unchanged cholelithiasis.   12/11/2020 Imaging   EXAM: CT ABDOMEN AND PELVIS WITHOUT CONTRAST  IMPRESSION: 1. Stable inflammatory changes centered around the distal descending colon in the left lower abdomen. Pericolonic inflammatory changes have minimally changed since 11/27/2020. Small pocket of gas medial to the colon is probably associated with a fistula or small residual abscess collection. 2. Stable position of the two percutaneous drains. The more anterior drain may be extending through a portion of the small bowel. 3. Cholelithiasis. 4. Fibroid uterus. Cannot exclude an ovarian/adnexal cystic structure near the uterine fundus. 5. Left adrenal adenoma.   01/07/2021 Imaging   EXAM: CT ABDOMEN AND PELVIS WITH CONTRAST  IMPRESSION: 1. No new abscesses identified. Similar degree of soft tissue thickening seen in the left pericolonic region. The degree of persistent soft tissues thickening in the pericolonic space further raises suspicions for malignancy. Further evaluation with colonoscopy should be performed. 2. Left retroperitoneal abscess drain unchanged in position. Anterior left abdominal drain again seen terminating within small bowel loop. 3. 2.6 cm mildly sclerotic lesion noted in the L2 vertebral body. Further evaluation with contrast enhanced lumbar spine MRI should be performed.   02/04/2021 Imaging   EXAM: CT ABDOMEN AND PELVIS WITH CONTRAST  IMPRESSION: No new abdominopelvic collections or abscess development in the 1 month interval.   Left anterior drain remains within a loop of small bowel, unchanged.   Left lateral abscess drain remains in the retroperitoneal space adjacent to the iliopsoas muscle with a  small amount of residual fluid and air but no  measurable collection.   Stable soft tissue prominence and pericolonic strandy edema/inflammation about the left descending colon compatible with residual diverticulitis/colitis. Difficult to exclude underlying transmural lesion.   04/01/2021 Imaging   EXAM: CT ABDOMEN AND PELVIS WITH CONTRAST  IMPRESSION: There appears to be significant enhancement and wall thickening involving the descending colon with some degree of traction and involvement of adjacent small bowel loops. This is consistent with the history of diverticulitis and perforation. Stable position of percutaneous drainage catheter is seen adjacent to left psoas muscle with no significant residual fluid remaining. The other percutaneous drainage catheter that was previously noted to be within small bowel loop on prior exam in the left lower quadrant, has significantly retracted and appears to be outside of the peritoneal space at this time.   Since the prior exam, there does appear to be some degree of rotation involving mesenteric vessels and structures in the right lower quadrant, suggesting partial volvulus or malrotation. There is the interval development of several lymph nodes in this area, most likely inflammatory or reactive in etiology. Mild amount of free fluid is also noted in the pelvis. However, no significant bowel wall thickening or dilatation is seen in this area. These results will be called to the ordering clinician or representative by the Radiologist Assistant, and communication documented in the PACS or zVision Dashboard.   Stable uterine fibroid.   Hepatic steatosis.   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   Cholelithiasis.   04/10/2021 Imaging   EXAM: CT ANGIOGRAPHY CHEST CT ABDOMEN AND PELVIS WITH CONTRAST  IMPRESSION: 1. Again seen are findings compatible with descending colon diverticulitis with perforation. Free air and inflammation have increased in the interval. 2. New lobulated enhancing  fluid collection posterior to this segment of inflamed colon measuring 8.5 x 3.5 x 10.0 cm. This collection now invades the adjacent iliopsoas muscle as well as extends through the left lateral abdominal wall. The tip of the drainage catheter is in this collection. Findings are compatible with abscess. 3. Anterior left percutaneous drainage catheter tip has been pulled back and is now within the subcutaneous tissues. 4. Trace free fluid. 5. No pulmonary embolism.  No acute cardiopulmonary process. 6. Cholelithiasis. 7. Fatty infiltration of the liver.   04/15/2021 Definitive Surgery   FINAL MICROSCOPIC DIAGNOSIS:   A. SMALL BOWEL, RESECTION:  - Adenocarcinoma.  - No carcinoma identified in 1 lymph node.   B. PERFORATED LEFT COLON, RESECTION:  - Moderately differentiated colonic adenocarcinoma.  - Tumor extends into pericolonic adipose tissue, and is strongly  suggestive of invasion into small bowel.  See oncology table/comments.  - No carcinoma identified in 4 lymph nodes (0/4).  - Tubular adenoma with high-grade dysplasia, 1.  - Tubular adenomas with low grade dysplasia, 3.   Comments: The size of the tumor is difficult to estimate secondary to the disrupted nature of the specimen, as well as the infiltrative nature of the tumor.  Tumor can be identified as definitely invading into the pericolonic adipose tissue (block B4), but given the extreme disruption of the tissue, and the involvement of the small bowel by what appears to be a colonic adenocarcinoma, I favor perforation of the large bowel with direct invasion into the small bowel.  Accordingly, I believe this is best regarded as a pT4b lesion.   ADDENDUM:  Mismatch Repair Protein (IHC)  SUMMARY INTERPRETATION: ABNORMAL  There is loss of the major MMR protein [name]6: This indicates  a high probability that a hereditary germline mutation is present and referral to genetic counseling is warranted. It is recommended that the loss of  protein expression be correlated with molecular based microsatellite instability testing.   IHC EXPRESSION RESULTS  TEST           RESULT  MLH1:          Preserved nuclear expression  [name]2:          Preserved nuclear expression  [name]6:          LOSS OF NUCLEAR EXPRESSION  PMS2:          Preserved nuclear expression    04/15/2021 Cancer Staging   Staging form: Colon and Rectum, AJCC 8th Edition - Pathologic stage from 04/15/2021: Stage IIC (pT4b, pN0, cM0) - Signed by [name] [place], MD on 06/12/2021 Stage prefix: Initial diagnosis Total positive nodes: 0 Histologic grading system: 4 grade system Histologic grade (G): G2 Residual tumor (R): R0 - None   04/29/2021 Imaging   EXAM: CT ABDOMEN AND PELVIS WITH CONTRAST  IMPRESSION: Continued left retroperitoneal abscesses inferior to the left kidney, involving the left psoas muscle and left abdominal wall musculature. Overall size is decreased since prior study. Interval removal of left lower quadrant abscess drainage catheter.   New fluid collection in the cul-de-sac and wrapping around the uterus concerning for abscess.   Cholelithiasis.   Small left pleural effusion.  Bibasilar atelectasis.   Stable left adrenal adenoma.  ADDENDUM: After discussing the case with Dr. Pierre Briar in interventional radiology, it was noted that the fluid in the pelvis originally thought to be in the cul-de-sac is likely within the vagina, best seen on sagittal imaging. Recommend speculum exam.   Also, in the left lateral wall in the area of prior abscess in the lateral wall abdominal musculature, some of the abdomen soft tissue appears to be enhancing. While this could be infectious, cannot exclude tumor seeding in the left lateral abdominal wall, measuring 6.9 x 3.8 cm on image 64 of series 2.   06/16/2021 Imaging   EXAM: CT ABDOMEN AND PELVIS WITH CONTRAST  IMPRESSION: Increased size of bulky soft tissue masses involving the left psoas muscle and left  lateral abdominal wall soft tissues, consistent with progressive metastatic disease.   Significant progression of multiple peritoneal masses throughout the abdomen and pelvis, consistent with peritoneal carcinomatosis.   New mild retroperitoneal lymphadenopathy, consistent with metastatic disease.   Stable uterine fibroid and left adrenal adenoma.   Cholelithiasis, without evidence of cholecystitis.   06/22/2021 - 08/19/2021 Chemotherapy   Patient is on Treatment Plan : COLORECTAL FOLFOX q14d x 6 months     06/22/2021 Tumor Marker   Patient's tumor was tested for the following markers: CEA. Results of the tumor marker test revealed 87.41.   07/10/2021 Pathology Results   FINAL MICROSCOPIC DIAGNOSIS:   A. PERITONEAL MASS, LEFT UPPER ABDOMINAL QUADRANT, BIOPSY:  -  Metastatic adenocarcinoma with necrosis, histologically consistent with colon primary.     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 The report date is 07/16/2021. The remainder of testing was negative/normal.  The CancerNext-Expanded + RNAinsight gene panel offered by [facility] and includes sequencing and rearrangement analysis for the following 77 genes: IP, ALK, APC*, ATM*, AXIN2, BAP1, BARD1, BLM, BMPR1A, BRCA1*, BRCA2*, BRIP1*, CDC73, CDH1*,CDK4, CDKN1B, CDKN2A, CHEK2*, CTNNA1, DICER1, FANCC, FH, FLCN, GALNT12, KIF1B, LZTR1, MAX, MEN1, MET, MLH1*, [name]2*, [name]3, [name]6*, MUTYH*, NBN, NF1*, NF2, NTHL1,  PALB2*, PHOX2B, PMS2*, POT1, PRKAR1A, PTCH1, PTEN*, RAD51C*, RAD51D*,RB1, RECQL, RET, SDHA, SDHAF2, SDHB, SDHC, SDHD, SMAD4, SMARCA4, SMARCB1, SMARCE1, STK11, SUFU, TMEM127, TP53*,TSC1, TSC2, VHL and XRCC2 (sequencing and deletion/duplication); EGFR, EGLN1, HOXB13, KIT, MITF, PDGFRA, POLD1 and POLE (sequencing only); EPCAM and GREM1 (deletion/duplication only).   08/28/2021 Imaging   EXAM: CT CHEST, ABDOMEN, AND PELVIS WITH CONTRAST  IMPRESSION: 1.  Continued interval progression of multiple metastatic soft tissue nodules/masses in the abdomen and pelvis. 2. No evidence for metastatic disease in the chest. 3. Similar appearance of the subtle lesion in the L2 vertebral body, suspicious for metastatic involvement. 4. Cholelithiasis.   09/03/2021 -  Chemotherapy   Patient is on Treatment Plan : COLORECTAL Pembrolizumab (200) q21d     09/08/2021 - 02/01/2022 Chemotherapy   Patient is on Treatment Plan : COLORECTAL Pembrolizumab (200) q21d     01/08/2022 Imaging   CT CAP IMPRESSION: 1. Mixed response to therapy. Interval decrease in size of the conglomerate multilobulated mass extending from the left psoas into the left posterior pararenal space through the left lateral abdominal wall along the tract of the previously placed percutaneous drainage catheter. Interval decrease in size of right adnexal mass and omental masses within the right lower quadrant of the abdomen. Interval development of peritoneal carcinomatosis with omental caking within the left lower quadrant of the abdomen and left subdiaphragmatic region adjacent to the spleen and along the left pericolic gutter as well as development of ascites appearing loculated within the anterior peritoneum. 2. Interval development of mild left hydronephrosis secondary to stricturing of the mid left ureter. 3. Stable left adrenal metastasis. 4. Stable sclerotic lesion within the L2 vertebral body. No new lytic or blastic bone lesions are seen. 5. Cholelithiasis. 6. No evidence of intrathoracic metastatic disease. 7. 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 Moderate colonic stool burden. No evidence of obstruction.   03/15/2022 Imaging   IMPRESSION: 1. Status post sigmoid colon resection with left lower quadrant end colostomy. 2. Multicystic bilateral ovarian lesions are slightly increased in size. 3. Diminished, small  volume of loculated appearing ascites throughout the abdomen and pelvis, with similar appearance of peritoneal thickening and nodularity throughout. 4. Heterogeneously calcified, conglomerate masses in the left retroperitoneum and abdominal wall are slightly diminished in size. 5. Left adrenal lesion not significantly changed. 6. Unchanged faintly sclerotic metastatic lesion of L2. 7. Constellation of findings is consistent with mixed response to treatment however with overall little interval change. 8. Minimal residual left hydronephrosis and proximal hydroureter, improved compared to prior examination. 9. Cholelithiasis. 10. Coronary artery disease, significantly advanced for patient age.   08/04/2022 Imaging    IMPRESSION: CT CHEST:   1. No developing mass lesion, fluid collection or lymph node enlargement in the thorax. Stable tiny 3 mm lung nodule. 2. Coronary artery calcifications. Please correlate for other coronary risk factors. 3. Chest port   CT ABDOMEN AND PELVIS:   1. Extensive surgical changes identified. Left lower quadrant ostomy, diverting colostomy. Surgical changes along loops of small bowel in the right hemiabdomen. 2. Once again there are areas of nodular tissue extending along the left lateral abdominal wall, into the retroperitoneum which are similar to previous. The amount of adjacent fluid in these locations has improved. 3. No new mass lesion, fluid collection or lymph node enlargement in the abdomen or pelvis. 4. Gallstones. 5. Enlarged uterus with fibroids. Bilateral complex cystic areas are once again seen and based on appearance differential  includes dilated fallopian tubes, hydrosalpinx versus true cystic lesions. Please correlate for any known history or prior workup 6. Limited evaluation for solid organ pathology metastatic disease without the advantage of IV contrast     05/04/2023 PET scan   PET - restaging - skull base to thigh   IMPRESSION: 1. Subcentimeter peritoneal nodules in the left abdomen have decreased in size and hypermetabolism from 04/26/2022, compatible with treated metastatic disease. 2. Chronic focal hypermetabolism associated with a gallbladder nodule. Malignancy cannot be excluded. 3. Small hypermetabolic to borderline hypermetabolic external iliac and left inguinal lymph nodes and minimally hypermetabolic small bilateral cervical lymph nodes, nonspecific. Metastatic disease not excluded. Recommend attention on follow-up. 4. Left adrenal adenoma. 5. Cystic right adnexal mass is inadequately characterized. Because this lesion is not adequately characterized, prompt [place] is recommended for further evaluation. Note: This recommendation does not apply to premenarchal patients and to those with increased risk (genetic, family history, elevated tumor markers or other high-risk factors) of ovarian cancer. Reference: JACR 2020 Feb; 17(2):248-254 6. 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 No developing mass lesion, fluid collection or lymph node enlargement in the thorax. Stable tiny 3 mm lung nodule. 2. Coronary artery calcifications. Please correlate for other coronary risk factors. 3. Chest port   CT ABDOMEN AND PELVIS:   1. Extensive surgical changes identified. Left lower quadrant ostomy, diverting colostomy. Surgical changes along loops of small bowel in the right hemiabdomen. 2. Once again there are areas of nodular tissue extending along the left lateral abdominal wall, into the retroperitoneum which are similar to previous. The amount of adjacent fluid in these locations has improved. 3. No new  mass lesion, fluid collection or lymph node enlargement in the abdomen or pelvis. 4. Gallstones. 5. Enlarged uterus with fibroids. Bilateral complex cystic areas are once again seen and based on appearance differential includes dilated fallopian tubes, hydrosalpinx versus true cystic lesions. Please correlate for any known history or prior workup 6. Limited evaluation for solid organ pathology metastatic disease without the advantage of IV contrast        Discussed the use of AI scribe software for clinical note transcription with the patient, who gave verbal consent to proceed.  History of Present Illness A 38 year old patient with a history of metastatic colon cancer presents for a follow-up visit. She reports experiencing body and joint pain, particularly in the knees and hands, which has been somewhat alleviated by taking Tylenol number three as needed. The patient notes that the pain is a known side effect of her current treatment. She has been on Keytruda for almost two years and is approaching the two-year mark of her cancer treatment. The patient also mentions having an ostomy bag and a fibroid. She has been trying to increase her physical activity by walking and exercising, but notes that she can only be on her feet for so long before she starts feeling pain. Her appetite is good and she has been eating well.     All other systems were reviewed with the patient and are negative.  MEDICAL HISTORY:  Past Medical History:  Diagnosis Date   Allergy    seasonal   Anemia  Blood infection 1985   Blood transfusion without reported diagnosis    colon ca 04/2021   Diverticulitis    Family history of breast cancer    Family history of colon cancer    Family history of colon cancer    Family history of stomach cancer    Hypertension    Obesity    Sleep apnea     SURGICAL HISTORY: Past Surgical History:  Procedure Laterality Date   COLECTOMY WITH COLOSTOMY CREATION/HARTMANN  PROCEDURE N/A 04/15/2021   Procedure: COLOSTOMY CREATION/HARTMANN PROCEDURE;  Surgeon: [name], MD;  Location: [facility];  Service: General;  Laterality: N/A;   IR CATHETER TUBE CHANGE  12/12/2020   IR CATHETER TUBE CHANGE  01/27/2021   IR CATHETER TUBE CHANGE  02/19/2021   IR CATHETER TUBE CHANGE  04/13/2021   IR IMAGING GUIDED PORT INSERTION  10/02/2021   IR RADIOLOGIST EVAL & MGMT  11/27/2020   IR RADIOLOGIST EVAL & MGMT  12/11/2020   IR RADIOLOGIST EVAL & MGMT  01/07/2021   IR RADIOLOGIST EVAL & MGMT  02/04/2021   IR SINUS/FIST TUBE CHK-NON GI  12/12/2020   IR SINUS/FIST TUBE CHK-NON GI  02/19/2021   LAPAROSCOPIC PARTIAL COLECTOMY N/A 04/15/2021   Procedure: LAPAROSCOPIC ASSISTED HARTMANN RESECTION;  Surgeon: [name], MD;  Location: [facility];  Service: General;  Laterality: N/A;    I have reviewed the social history and family history with the patient and they are unchanged from previous note.  ALLERGIES:  is allergic to chlorhexidine gluconate, shellfish allergy, and penicillins.  MEDICATIONS:  Current Outpatient Medications  Medication Sig Dispense Refill   acetaminophen-codeine (TYLENOL #3) 300-30 MG tablet Take 1 tablet by mouth every 8 (eight) hours as needed for moderate pain (pain score 4-6). 30 tablet 0   ALPRAZolam (XANAX) 0.25 MG tablet Take 1 tablet (0.25 mg total) by mouth at bedtime as needed for anxiety. 30 tablet 0   amLODipine (NORVASC) 10 MG tablet TAKE 1 TABLET BY MOUTH EVERY DAY 90 tablet 3   aspirin EC 81 MG tablet Take 1 tablet (81 mg total) by mouth daily. Swallow whole.     atorvastatin (LIPITOR) 40 MG tablet Take 1 tablet (40 mg total) by mouth daily. 30 tablet 6   diclofenac Sodium (VOLTAREN) 1 % GEL Apply 2 g topically 4 (four) times daily. 100 g 2   dicyclomine (BENTYL) 10 MG capsule Take 1 capsule (10 mg total) by mouth every 8 (eight) hours as needed for spasms. 20 capsule 0   furosemide (LASIX) 20 MG tablet TAKE 1 TABLET BY MOUTH EVERY DAY AS NEEDED  90 tablet 1   gabapentin (NEURONTIN) 300 MG capsule Take 1 capsule (300 mg total) by mouth at bedtime. 90 capsule 1   KLOR-CON M20 20 MEQ tablet TAKE 1 TABLET BY MOUTH EVERY DAY 90 tablet 1   lidocaine-prilocaine (EMLA) cream Apply 1 Application topically as needed. 30 g 3   methocarbamol (ROBAXIN-750) 750 MG tablet Take 1 tablet (750 mg total) by mouth every 8 (eight) hours as needed for muscle spasms. 60 tablet 0   Metoprolol Tartrate 75 MG TABS Take 1 tablet (75 mg total) by mouth 2 (two) times daily. 180 tablet 3   nitrofurantoin, macrocrystal-monohydrate, (MACROBID) 100 MG capsule Take 1 capsule (100 mg total) by mouth 2 (two) times daily. 10 capsule 0   POTASSIUM PO Take by mouth.     prochlorperazine (COMPAZINE) 10 MG tablet Take 1 tablet (10 mg total) by mouth every 8 (eight)  hours as needed for nausea or vomiting. 30 tablet 0   sulfamethoxazole-trimethoprim (BACTRIM DS) 800-160 MG tablet      traZODone (DESYREL) 50 MG tablet TAKE 1-2 TABLETS BY MOUTH AT BEDTIME AS NEEDED FOR SLEEP. 180 tablet 1   triamcinolone cream (KENALOG) 0.1 % APPLY TO AFFECTED AREA TWICE DAILY AS NEEDED 30 g 0   valsartan (DIOVAN) 160 MG tablet Take 1 tablet (160 mg total) by mouth daily. 30 tablet 11   No current facility-administered medications for this visit.   Facility-Administered Medications Ordered in Other Visits  Medication Dose Route Frequency Provider Last Rate Last Admin   fentaNYL (SUBLIMAZE) injection   Intravenous PRN Mir, Al Corpus, MD   50 mcg at 10/02/21 1436   midazolam (VERSED) injection   Intravenous PRN Mir, Al Corpus, MD   1 mg at 10/02/21 1436   sodium chloride flush (NS) 0.9 % injection 10 mL  10 mL Intracatheter PRN Malachy Mood, MD   10 mL at 09/22/23 1453    PHYSICAL EXAMINATION: ECOG PERFORMANCE STATUS: 1 - Symptomatic but completely ambulatory  Vitals:   09/22/23 1320  BP: 138/84  Pulse: 79  Resp: 18  Temp: 97.6 F (36.4 C)  SpO2: 99%   Wt Readings from Last 3 Encounters:   09/22/23 (!) 346 lb 9.6 oz (157.2 kg)  09/12/23 (!) 345 lb (156.5 kg)  08/23/23 (!) 348 lb 12.8 oz (158.2 kg)     GENERAL:alert, no distress and comfortable. Obese female  SKIN: skin color, texture, turgor are normal, no rashes or significant lesions EYES: normal, Conjunctiva are pink and non-injected, sclera clear NECK: supple, thyroid normal size, non-tender, without nodularity LYMPH:  no palpable lymphadenopathy in the cervical, axillary  LUNGS: clear to auscultation and percussion with normal breathing effort HEART: regular rate & rhythm and no murmurs and no lower extremity edema ABDOMEN:abdomen soft, non-tender and normal bowel sounds Musculoskeletal:no cyanosis of digits and no clubbing  NEURO: alert & oriented x 3 with fluent speech, no focal motor/sensory deficits  Physical Exam    LABORATORY DATA:  I have reviewed the data as listed    Latest Ref Rng & Units 09/22/2023    1:01 PM 09/01/2023    2:32 PM 08/11/2023    1:34 PM  CBC  WBC 4.0 - 10.5 K/uL 6.8  6.4  6.6   Hemoglobin 12.0 - 15.0 g/dL 44.0  10.2  72.5   Hematocrit 36.0 - 46.0 % 35.4  35.4  35.0   Platelets 150 - 400 K/uL 453  302  396         Latest Ref Rng & Units 09/22/2023    1:01 PM 09/01/2023    2:32 PM 08/11/2023    1:34 PM  CMP  Glucose 70 - 99 mg/dL 94  90  88   BUN 6 - 20 mg/dL 21  20  15    Creatinine 0.44 - 1.00 mg/dL 3.66  4.40  3.47   Sodium 135 - 145 mmol/L 139  137  137   Potassium 3.5 - 5.1 mmol/L 4.3  4.6  4.2   Chloride 98 - 111 mmol/L 109  107  109   CO2 22 - 32 mmol/L 25  23  24    Calcium 8.9 - 10.3 mg/dL 8.6  9.0  8.6   Total Protein 6.5 - 8.1 g/dL 7.3  7.4  7.0   Total Bilirubin 0.0 - 1.2 mg/dL 0.3  0.2  0.2   Alkaline Phos 38 - 126 U/L 42  41  44   AST 15 - 41 U/L 14  13  15    ALT 0 - 44 U/L 12  10  13        RADIOGRAPHIC STUDIES: I have personally reviewed the radiological images as listed and agreed with the findings in the report. No results found.    No orders of the  defined types were placed in this encounter.  All questions were answered. The patient knows to call the clinic with any problems, questions or concerns. No barriers to learning was detected. The total time spent in the appointment was 25 minutes.     [name] New Sarpy, MD 09/22/2023

## 2023-09-22 NOTE — Patient Instructions (Signed)

## 2023-09-23 ENCOUNTER — Other Ambulatory Visit: Payer: Self-pay

## 2023-09-23 DIAGNOSIS — C187 Malignant neoplasm of sigmoid colon: Secondary | ICD-10-CM

## 2023-09-23 NOTE — Progress Notes (Signed)
 As per Dr. Maryalice Smaller, placed order in portal for Guardant Reveal successfully, sent paperwork to lab to be drawn on 5/8.

## 2023-09-28 DIAGNOSIS — I10 Essential (primary) hypertension: Secondary | ICD-10-CM

## 2023-09-28 DIAGNOSIS — G4733 Obstructive sleep apnea (adult) (pediatric): Secondary | ICD-10-CM

## 2023-09-29 MED ORDER — VALSARTAN 320 MG PO TABS
320.0000 mg | ORAL_TABLET | Freq: Every day | ORAL | 6 refills | Status: DC
Start: 2023-09-29 — End: 2024-04-10

## 2023-09-29 NOTE — Telephone Encounter (Signed)
 Pt informed of providers result & recommendations. Pt verbalized understanding. All questions, if any, were answered. New rx sent to requested pharmacy. Pt will send BP/HR log in 2 weeks

## 2023-09-29 NOTE — Telephone Encounter (Signed)
 BP above goal. Let's increase Valsartan  to 320mg  daily. She will need a repeat BMET in 1-2 week. She get labs checked frequently at the cancer center so if she is already scheduled to have labs checked within that timeframe there, then we don't need to order any additional labs.  Would recommend she keep another BP/HR log for 2 weeks and then send this to us  via MyChart to see if any additional changes need to be made.   Thank you!

## 2023-10-12 ENCOUNTER — Encounter: Payer: Self-pay | Admitting: Hematology

## 2023-10-13 ENCOUNTER — Inpatient Hospital Stay: Attending: Hematology

## 2023-10-13 ENCOUNTER — Encounter: Payer: Self-pay | Admitting: Hematology

## 2023-10-13 ENCOUNTER — Inpatient Hospital Stay

## 2023-10-13 VITALS — BP 134/81 | HR 78 | Temp 98.5°F | Resp 16 | Wt 350.5 lb

## 2023-10-13 DIAGNOSIS — Z9221 Personal history of antineoplastic chemotherapy: Secondary | ICD-10-CM | POA: Insufficient documentation

## 2023-10-13 DIAGNOSIS — Z7982 Long term (current) use of aspirin: Secondary | ICD-10-CM | POA: Insufficient documentation

## 2023-10-13 DIAGNOSIS — K760 Fatty (change of) liver, not elsewhere classified: Secondary | ICD-10-CM | POA: Diagnosis not present

## 2023-10-13 DIAGNOSIS — M2550 Pain in unspecified joint: Secondary | ICD-10-CM | POA: Insufficient documentation

## 2023-10-13 DIAGNOSIS — K8020 Calculus of gallbladder without cholecystitis without obstruction: Secondary | ICD-10-CM | POA: Diagnosis not present

## 2023-10-13 DIAGNOSIS — K6819 Other retroperitoneal abscess: Secondary | ICD-10-CM | POA: Diagnosis not present

## 2023-10-13 DIAGNOSIS — D3502 Benign neoplasm of left adrenal gland: Secondary | ICD-10-CM | POA: Insufficient documentation

## 2023-10-13 DIAGNOSIS — Z803 Family history of malignant neoplasm of breast: Secondary | ICD-10-CM | POA: Insufficient documentation

## 2023-10-13 DIAGNOSIS — C7972 Secondary malignant neoplasm of left adrenal gland: Secondary | ICD-10-CM | POA: Diagnosis not present

## 2023-10-13 DIAGNOSIS — D500 Iron deficiency anemia secondary to blood loss (chronic): Secondary | ICD-10-CM

## 2023-10-13 DIAGNOSIS — K5720 Diverticulitis of large intestine with perforation and abscess without bleeding: Secondary | ICD-10-CM | POA: Diagnosis not present

## 2023-10-13 DIAGNOSIS — R911 Solitary pulmonary nodule: Secondary | ICD-10-CM | POA: Diagnosis not present

## 2023-10-13 DIAGNOSIS — D649 Anemia, unspecified: Secondary | ICD-10-CM | POA: Insufficient documentation

## 2023-10-13 DIAGNOSIS — C786 Secondary malignant neoplasm of retroperitoneum and peritoneum: Secondary | ICD-10-CM

## 2023-10-13 DIAGNOSIS — Z5112 Encounter for antineoplastic immunotherapy: Secondary | ICD-10-CM | POA: Insufficient documentation

## 2023-10-13 DIAGNOSIS — C7951 Secondary malignant neoplasm of bone: Secondary | ICD-10-CM | POA: Diagnosis not present

## 2023-10-13 DIAGNOSIS — Z452 Encounter for adjustment and management of vascular access device: Secondary | ICD-10-CM

## 2023-10-13 DIAGNOSIS — C187 Malignant neoplasm of sigmoid colon: Secondary | ICD-10-CM | POA: Insufficient documentation

## 2023-10-13 DIAGNOSIS — Z1502 Genetic susceptibility to malignant neoplasm of ovary: Secondary | ICD-10-CM | POA: Insufficient documentation

## 2023-10-13 DIAGNOSIS — Z79899 Other long term (current) drug therapy: Secondary | ICD-10-CM | POA: Insufficient documentation

## 2023-10-13 DIAGNOSIS — N83209 Unspecified ovarian cyst, unspecified side: Secondary | ICD-10-CM | POA: Diagnosis not present

## 2023-10-13 DIAGNOSIS — R222 Localized swelling, mass and lump, trunk: Secondary | ICD-10-CM | POA: Diagnosis not present

## 2023-10-13 DIAGNOSIS — J90 Pleural effusion, not elsewhere classified: Secondary | ICD-10-CM | POA: Diagnosis not present

## 2023-10-13 DIAGNOSIS — D259 Leiomyoma of uterus, unspecified: Secondary | ICD-10-CM | POA: Diagnosis not present

## 2023-10-13 DIAGNOSIS — I2510 Atherosclerotic heart disease of native coronary artery without angina pectoris: Secondary | ICD-10-CM | POA: Insufficient documentation

## 2023-10-13 DIAGNOSIS — Z800 Family history of malignant neoplasm of digestive organs: Secondary | ICD-10-CM | POA: Insufficient documentation

## 2023-10-13 DIAGNOSIS — Z791 Long term (current) use of non-steroidal anti-inflammatories (NSAID): Secondary | ICD-10-CM | POA: Diagnosis not present

## 2023-10-13 DIAGNOSIS — N920 Excessive and frequent menstruation with regular cycle: Secondary | ICD-10-CM | POA: Diagnosis not present

## 2023-10-13 LAB — CMP (CANCER CENTER ONLY)
ALT: 15 U/L (ref 0–44)
AST: 13 U/L — ABNORMAL LOW (ref 15–41)
Albumin: 4 g/dL (ref 3.5–5.0)
Alkaline Phosphatase: 51 U/L (ref 38–126)
Anion gap: 5 (ref 5–15)
BUN: 15 mg/dL (ref 6–20)
CO2: 24 mmol/L (ref 22–32)
Calcium: 8.7 mg/dL — ABNORMAL LOW (ref 8.9–10.3)
Chloride: 109 mmol/L (ref 98–111)
Creatinine: 0.75 mg/dL (ref 0.44–1.00)
GFR, Estimated: >60 mL/min (ref >60)
Glucose, Bld: 91 mg/dL (ref 70–99)
Potassium: 4.1 mmol/L (ref 3.5–5.1)
Sodium: 138 mmol/L (ref 135–145)
Total Bilirubin: 0.2 mg/dL (ref 0.0–1.2)
Total Protein: 7.2 g/dL (ref 6.5–8.1)

## 2023-10-13 LAB — CBC WITH DIFFERENTIAL (CANCER CENTER ONLY)
Abs Immature Granulocytes: 0.03 10*3/uL (ref 0.00–0.07)
Basophils Absolute: 0 10*3/uL (ref 0.0–0.1)
Basophils Relative: 1 %
Eosinophils Absolute: 0.1 10*3/uL (ref 0.0–0.5)
Eosinophils Relative: 2 %
HCT: 32.7 % — ABNORMAL LOW (ref 36.0–46.0)
Hemoglobin: 10.6 g/dL — ABNORMAL LOW (ref 12.0–15.0)
Immature Granulocytes: 0 %
Lymphocytes Relative: 31 %
Lymphs Abs: 2.1 10*3/uL (ref 0.7–4.0)
MCH: 26 pg (ref 26.0–34.0)
MCHC: 32.4 g/dL (ref 30.0–36.0)
MCV: 80.1 fL (ref 80.0–100.0)
Monocytes Absolute: 0.4 10*3/uL (ref 0.1–1.0)
Monocytes Relative: 6 %
Neutro Abs: 4.2 10*3/uL (ref 1.7–7.7)
Neutrophils Relative %: 60 %
Platelet Count: 422 10*3/uL — ABNORMAL HIGH (ref 150–400)
RBC: 4.08 MIL/uL (ref 3.87–5.11)
RDW: 15.7 % — ABNORMAL HIGH (ref 11.5–15.5)
WBC Count: 7 10*3/uL (ref 4.0–10.5)
nRBC: 0 % (ref 0.0–0.2)

## 2023-10-13 LAB — CEA (ACCESS): CEA (CHCC): <1 ng/mL (ref 0.00–5.00)

## 2023-10-13 LAB — TSH: TSH: 1.08 u[IU]/mL (ref 0.350–4.500)

## 2023-10-13 LAB — FERRITIN: Ferritin: 23 ng/mL (ref 11–307)

## 2023-10-13 MED ORDER — SODIUM CHLORIDE 0.9% FLUSH
10.0000 mL | INTRAVENOUS | Status: DC | PRN
Start: 2023-10-13 — End: 2023-10-13

## 2023-10-13 MED ORDER — SODIUM CHLORIDE 0.9 % IV SOLN
Freq: Once | INTRAVENOUS | Status: AC
Start: 2023-10-13 — End: 2023-10-13

## 2023-10-13 MED ORDER — HEPARIN SOD (PORK) LOCK FLUSH 100 UNIT/ML IV SOLN
500.0000 [IU] | Freq: Once | INTRAVENOUS | Status: DC | PRN
Start: 2023-10-13 — End: 2023-10-13

## 2023-10-13 MED ORDER — SODIUM CHLORIDE 0.9% FLUSH
10.0000 mL | Freq: Once | INTRAVENOUS | Status: AC
  Administered 2023-10-13: 10 mL

## 2023-10-13 MED ORDER — SODIUM CHLORIDE 0.9 % IV SOLN
200.0000 mg | Freq: Once | INTRAVENOUS | Status: AC
  Administered 2023-10-13: 200 mg via INTRAVENOUS
  Filled 2023-10-13: qty 200

## 2023-10-13 NOTE — Patient Instructions (Signed)

## 2023-10-14 LAB — T4: T4, Total: 7.1 ug/dL (ref 4.5–12.0)

## 2023-10-21 ENCOUNTER — Encounter: Payer: Self-pay | Admitting: Hematology

## 2023-10-21 LAB — GUARDANT REVEAL

## 2023-10-26 NOTE — Telephone Encounter (Signed)
 BP often slightly above goal. If patient is willing, let's stop Her Lopressor  and start Coreg 18.75mg  twice daily instead. Coreg is in the same class of medications as Lopressor  but typically does a better job at controlling BP.  Would then recommend she keep a BP/ HR log for another 2 weeks and send this to us .  Thank you!

## 2023-10-31 ENCOUNTER — Other Ambulatory Visit: Payer: Self-pay

## 2023-10-31 DIAGNOSIS — I10 Essential (primary) hypertension: Secondary | ICD-10-CM

## 2023-10-31 MED ORDER — CARVEDILOL 12.5 MG PO TABS: 18.7500 mg | ORAL_TABLET | Freq: Two times a day (BID) | ORAL | 3 refills | Status: DC

## 2023-10-31 NOTE — Progress Notes (Signed)
 Starting Coreg 18.75 mg BID per Callie Goodrich, PA-C.

## 2023-11-03 ENCOUNTER — Inpatient Hospital Stay

## 2023-11-03 ENCOUNTER — Inpatient Hospital Stay (HOSPITAL_BASED_OUTPATIENT_CLINIC_OR_DEPARTMENT_OTHER): Admitting: Hematology

## 2023-11-03 ENCOUNTER — Encounter: Payer: Self-pay | Admitting: Hematology

## 2023-11-03 VITALS — BP 122/80 | HR 92 | Temp 97.7°F | Resp 15 | Ht 63.0 in | Wt 355.8 lb

## 2023-11-03 DIAGNOSIS — N920 Excessive and frequent menstruation with regular cycle: Secondary | ICD-10-CM | POA: Diagnosis not present

## 2023-11-03 DIAGNOSIS — K5720 Diverticulitis of large intestine with perforation and abscess without bleeding: Secondary | ICD-10-CM | POA: Diagnosis not present

## 2023-11-03 DIAGNOSIS — Z800 Family history of malignant neoplasm of digestive organs: Secondary | ICD-10-CM | POA: Diagnosis not present

## 2023-11-03 DIAGNOSIS — Z1502 Genetic susceptibility to malignant neoplasm of ovary: Secondary | ICD-10-CM | POA: Diagnosis not present

## 2023-11-03 DIAGNOSIS — Z791 Long term (current) use of non-steroidal anti-inflammatories (NSAID): Secondary | ICD-10-CM | POA: Diagnosis not present

## 2023-11-03 DIAGNOSIS — C786 Secondary malignant neoplasm of retroperitoneum and peritoneum: Secondary | ICD-10-CM

## 2023-11-03 DIAGNOSIS — K8020 Calculus of gallbladder without cholecystitis without obstruction: Secondary | ICD-10-CM | POA: Diagnosis not present

## 2023-11-03 DIAGNOSIS — Z7982 Long term (current) use of aspirin: Secondary | ICD-10-CM | POA: Diagnosis not present

## 2023-11-03 DIAGNOSIS — C7951 Secondary malignant neoplasm of bone: Secondary | ICD-10-CM | POA: Diagnosis not present

## 2023-11-03 DIAGNOSIS — D3502 Benign neoplasm of left adrenal gland: Secondary | ICD-10-CM | POA: Diagnosis not present

## 2023-11-03 DIAGNOSIS — Z5112 Encounter for antineoplastic immunotherapy: Secondary | ICD-10-CM | POA: Diagnosis not present

## 2023-11-03 DIAGNOSIS — J90 Pleural effusion, not elsewhere classified: Secondary | ICD-10-CM | POA: Diagnosis not present

## 2023-11-03 DIAGNOSIS — K6819 Other retroperitoneal abscess: Secondary | ICD-10-CM | POA: Diagnosis not present

## 2023-11-03 DIAGNOSIS — C187 Malignant neoplasm of sigmoid colon: Secondary | ICD-10-CM

## 2023-11-03 DIAGNOSIS — Z9221 Personal history of antineoplastic chemotherapy: Secondary | ICD-10-CM | POA: Diagnosis not present

## 2023-11-03 DIAGNOSIS — I2510 Atherosclerotic heart disease of native coronary artery without angina pectoris: Secondary | ICD-10-CM | POA: Diagnosis not present

## 2023-11-03 DIAGNOSIS — C7972 Secondary malignant neoplasm of left adrenal gland: Secondary | ICD-10-CM | POA: Diagnosis not present

## 2023-11-03 DIAGNOSIS — Z452 Encounter for adjustment and management of vascular access device: Secondary | ICD-10-CM

## 2023-11-03 DIAGNOSIS — N83209 Unspecified ovarian cyst, unspecified side: Secondary | ICD-10-CM | POA: Diagnosis not present

## 2023-11-03 DIAGNOSIS — M2550 Pain in unspecified joint: Secondary | ICD-10-CM | POA: Diagnosis not present

## 2023-11-03 DIAGNOSIS — D259 Leiomyoma of uterus, unspecified: Secondary | ICD-10-CM | POA: Diagnosis not present

## 2023-11-03 DIAGNOSIS — D649 Anemia, unspecified: Secondary | ICD-10-CM | POA: Diagnosis not present

## 2023-11-03 DIAGNOSIS — R222 Localized swelling, mass and lump, trunk: Secondary | ICD-10-CM | POA: Diagnosis not present

## 2023-11-03 DIAGNOSIS — K760 Fatty (change of) liver, not elsewhere classified: Secondary | ICD-10-CM | POA: Diagnosis not present

## 2023-11-03 DIAGNOSIS — Z79899 Other long term (current) drug therapy: Secondary | ICD-10-CM | POA: Diagnosis not present

## 2023-11-03 DIAGNOSIS — R911 Solitary pulmonary nodule: Secondary | ICD-10-CM | POA: Diagnosis not present

## 2023-11-03 LAB — CBC WITH DIFFERENTIAL (CANCER CENTER ONLY)
Abs Immature Granulocytes: 0.02 10*3/uL (ref 0.00–0.07)
Basophils Absolute: 0 10*3/uL (ref 0.0–0.1)
Basophils Relative: 1 %
Eosinophils Absolute: 0.1 10*3/uL (ref 0.0–0.5)
Eosinophils Relative: 2 %
HCT: 31.6 % — ABNORMAL LOW (ref 36.0–46.0)
Hemoglobin: 10.2 g/dL — ABNORMAL LOW (ref 12.0–15.0)
Immature Granulocytes: 0 %
Lymphocytes Relative: 25 %
Lymphs Abs: 1.6 10*3/uL (ref 0.7–4.0)
MCH: 26.6 pg (ref 26.0–34.0)
MCHC: 32.3 g/dL (ref 30.0–36.0)
MCV: 82.3 fL (ref 80.0–100.0)
Monocytes Absolute: 0.4 10*3/uL (ref 0.1–1.0)
Monocytes Relative: 6 %
Neutro Abs: 4.3 10*3/uL (ref 1.7–7.7)
Neutrophils Relative %: 66 %
Platelet Count: 414 10*3/uL — ABNORMAL HIGH (ref 150–400)
RBC: 3.84 MIL/uL — ABNORMAL LOW (ref 3.87–5.11)
RDW: 16.2 % — ABNORMAL HIGH (ref 11.5–15.5)
WBC Count: 6.4 10*3/uL (ref 4.0–10.5)
nRBC: 0 % (ref 0.0–0.2)

## 2023-11-03 LAB — CMP (CANCER CENTER ONLY)
ALT: 15 U/L (ref 0–44)
AST: 13 U/L — ABNORMAL LOW (ref 15–41)
Albumin: 4.1 g/dL (ref 3.5–5.0)
Alkaline Phosphatase: 54 U/L (ref 38–126)
Anion gap: 7 (ref 5–15)
BUN: 18 mg/dL (ref 6–20)
CO2: 23 mmol/L (ref 22–32)
Calcium: 8.9 mg/dL (ref 8.9–10.3)
Chloride: 109 mmol/L (ref 98–111)
Creatinine: 0.74 mg/dL (ref 0.44–1.00)
GFR, Estimated: >60 mL/min (ref >60)
Glucose, Bld: 99 mg/dL (ref 70–99)
Potassium: 4 mmol/L (ref 3.5–5.1)
Sodium: 139 mmol/L (ref 135–145)
Total Bilirubin: 0.3 mg/dL (ref 0.0–1.2)
Total Protein: 7.4 g/dL (ref 6.5–8.1)

## 2023-11-03 LAB — CEA (ACCESS): CEA (CHCC): <1 ng/mL (ref 0.00–5.00)

## 2023-11-03 MED ORDER — SODIUM CHLORIDE 0.9 % IV SOLN
200.0000 mg | Freq: Once | INTRAVENOUS | Status: AC
  Administered 2023-11-03: 200 mg via INTRAVENOUS
  Filled 2023-11-03: qty 200

## 2023-11-03 MED ORDER — SODIUM CHLORIDE 0.9% FLUSH
10.0000 mL | Freq: Once | INTRAVENOUS | Status: AC
  Administered 2023-11-03: 10 mL

## 2023-11-03 MED ORDER — SODIUM CHLORIDE 0.9% FLUSH
10.0000 mL | INTRAVENOUS | Status: DC | PRN
  Administered 2023-11-03: 10 mL

## 2023-11-03 MED ORDER — SODIUM CHLORIDE 0.9 % IV SOLN: Freq: Once | INTRAVENOUS | Status: AC

## 2023-11-03 MED ORDER — HEPARIN SOD (PORK) LOCK FLUSH 100 UNIT/ML IV SOLN
500.0000 [IU] | Freq: Once | INTRAVENOUS | Status: AC | PRN
  Administered 2023-11-03: 500 [IU]

## 2023-11-03 NOTE — Assessment & Plan Note (Addendum)
-  MMR loss of [name]6, [name] with high mutation burden, acquired BRCA mutation (+)  --Initially presented with diarrhea 11/04/20, found to have diverticulitis with perforation and also developed an abscess requiring 2 drains, multiple hospital stay  --she started FOLFOX on 06/22/21 but did not respond -she switched to Keytruda  on 09/08/21. She has been tolerating well overall but has developed joint pain which could be related, overall manageable. She is clinically doing better also with more energy and less abdominal pain   -her CEA has normalized on immunotherapy. -CT CAP on 03/15/22 again showed mixed response -she had PET scan on 04/26/2022 for evaluation of her ovarian cysts, and saw GYN Dr. Daisey Dryer, we decided to monitor it. -She tolerating Keytruda  well, with mild joint pain.  She recently started workout at the gym, which has helped her joint pain. -restaging CT from 08/04/2022 showed stable disease in left abdomen and pelvis, no new lesions.  I personally reviewed her scan images, and discussed findings with patient.   -Will continue Keytruda   -repeated CT scan on January 24, 2023 showed continuous response, no new lesions.  -PET on 05/04/2023 showed no definitive evidence of progression, some mildly hypermetabolic nodes, will continue monitoring, plan to stop Keytruda  in 09/2023 if no progression on next scan  -PET 08/29/2023 showed similar tiny left pericolic hypermetabolic nodules, most consistent with metastatic disease, no other definitive evidence of residual disease  -ctDNA GuardantReveal was negative on 10/13/2023

## 2023-11-03 NOTE — Progress Notes (Signed)
 [facility]   [phone] [phone]   Clinic Follow up Note   Patient Care Team: Mikayla Curling, MD as PCP - General (Internal Medicine) Mikayla Hamburg, MD as PCP - Cardiology (Cardiology) Mikayla Rios as Physician Assistant (Emergency Medicine) Mikayla Rios , MD as Consulting Physician (Oncology) Mikayla Champagne, MD as Consulting Physician (General Surgery) [name], [name], MD as Consulting Physician (Neurology) [name], [name], MD as Consulting Physician (Obstetrics and Gynecology) Mikayla Peacemaker, MD as Consulting Physician (Gastroenterology) [name], [name], NP as Nurse Practitioner (Nurse Practitioner)  Date of Service:  11/03/2023  CHIEF COMPLAINT: f/u of sigmoid colon cancer  CURRENT THERAPY:  Keytruda  every 3 weeks  Oncology History   Malignant neoplasm of sigmoid colon [name], [name] with high mutation burden, acquired BRCA mutation (+)  --Initially presented with diarrhea 11/04/20, found to have diverticulitis with perforation and also developed an abscess requiring 2 drains, multiple hospital stay  --she started FOLFOX on 06/22/21 but did not respond -she switched to Keytruda  on 09/08/21. She has been tolerating well overall but has developed joint pain which could be related, overall manageable. She is clinically doing better also with more energy and less abdominal pain   -her CEA has normalized on immunotherapy. -CT CAP on 03/15/22 again showed mixed response -she had PET scan on 04/26/2022 for evaluation of her ovarian cysts, and saw GYN Dr. Daisey Dryer, we decided to monitor it. -She tolerating Keytruda  well, with mild joint pain.  She recently started workout at the gym, which has helped her joint pain. -restaging CT from 08/04/2022 showed stable disease in left abdomen and pelvis, no new lesions.  I personally reviewed her scan images, and discussed findings with patient.   -Will continue  Keytruda   -repeated CT scan on January 24, 2023 showed continuous response, no new lesions.  -PET on 05/04/2023 showed no definitive evidence of progression, some mildly hypermetabolic nodes, will continue monitoring, plan to stop Keytruda  in 09/2023 if no progression on next scan  -PET 08/29/2023 showed similar tiny left pericolic hypermetabolic nodules, most consistent with metastatic disease, no other definitive evidence of residual disease  -ctDNA GuardantReveal was negative on 10/13/2023  Assessment & Plan Metastatic colon cancer Circulating tumor DNA test (Guardian review) returned negative, indicating no detectable tumor DNA in the bloodstream. PET scan showed some uptake, possibly residual cancer or inflammation. Cancer recurrence remains possible, necessitating regular monitoring. Negative test suggests a chance cancer is gone, but not guaranteed. - Repeat circulating tumor DNA test on June 19, July 10, July 31, and August 21 - Continue current treatment until further tests confirm status - Perform a PET scan in mid-July - Monitor with scans and circulating tumor DNA tests every three months - Maintain port for a year or two  Anemia due to heavy menstrual bleeding Reports heavy and painful menstrual periods, irregular since February. Hemoglobin level at 10.2, indicating anemia likely due to heavy menstrual bleeding. Iron  levels previously low but improved. - Start prenatal vitamins for iron  supplementation - Monitor hemoglobin and iron  levels  Dizziness possibly due to medication changes Experiencing lightheadedness and dizziness, potentially related to recent cardiovascular medication changes. Blood pressure is normal, but dehydration may contribute to symptoms. - Monitor blood pressure regularly, especially during episodes of dizziness - Ensure adequate hydration  Plan - She is clinically stable, lab reviewed, will proceed with treatment today - Follow-up in 3 weeks before next  treatment Will repeat PET in July  - Will repeat CT  DNA in August, if negative, plan to stop Her treatment and monitoring   SUMMARY OF ONCOLOGIC HISTORY: Oncology History Overview Note   Cancer Staging  Malignant neoplasm of sigmoid colon [name]) Staging form: Colon and Rectum, AJCC 8th Edition - Pathologic stage from 04/15/2021: Stage IIC (pT4b, pN0, cM0) - Signed by Mikayla Rios Vernon, MD on 06/12/2021    Malignant neoplasm of sigmoid colon [facility])   Initial Diagnosis   Malignant neoplasm of sigmoid colon (HCC)   11/04/2020 Imaging   EXAM: CT ABDOMEN AND PELVIS WITHOUT CONTRAST  IMPRESSION: 1. Perforating descending colonic diverticulitis with multiple abdominopelvic gas and fluid collections, measuring up to 5.7 cm and further described above. 2. Cholelithiasis without findings of acute cholecystitis. 3. 3.6 cm benign left adrenal adenoma. 4. Leiomyomatous uterus. 5. 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   11/07/2020 Imaging   EXAM: CT ABDOMEN AND PELVIS WITHOUT CONTRAST  IMPRESSION: Continued wall thickening is seen involving descending colon suggesting infectious or inflammatory colitis or perforated diverticulitis. There is an adjacent fluid collection measuring 7.0 x 5.0 cm consistent with abscess which is significantly enlarged compared to prior exam. Adjacent to the abscess, there is a severely thickened small bowel loop most consistent with secondary inflammation.   5.2 x 3.5 cm fluid collection is noted within the left psoas muscle consistent with abscess which is significantly enlarged compared to prior exam. 4.4 x 3.8 cm fluid collection consistent with abscess is noted in the left retroperitoneal region which is also enlarged compared to prior exam.   Fibroid uterus.   Cholelithiasis.   11/27/2020 Imaging   EXAM: CT ABDOMEN AND PELVIS WITHOUT CONTRAST  IMPRESSION: 1.  Significantly interval decreased size of the previously visualized left retroperitoneal and left anterior mid abdominal fluid collections. There is persistent fat stranding and mural thickening of the mid descending colon at this level. 2. Unchanged leiomyomatous uterus. 3. Unchanged cholelithiasis.   12/11/2020 Imaging   EXAM: CT ABDOMEN AND PELVIS WITHOUT CONTRAST  IMPRESSION: 1. Stable inflammatory changes centered around the distal descending colon in the left lower abdomen. Pericolonic inflammatory changes have minimally changed since 11/27/2020. Small pocket of gas medial to the colon is probably associated with a fistula or small residual abscess collection. 2. Stable position of the two percutaneous drains. The more anterior drain may be extending through a portion of the small bowel. 3. Cholelithiasis. 4. Fibroid uterus. Cannot exclude an ovarian/adnexal cystic structure near the uterine fundus. 5. Left adrenal adenoma.   01/07/2021 Imaging   EXAM: CT ABDOMEN AND PELVIS WITH CONTRAST  IMPRESSION: 1. No new abscesses identified. Similar degree of soft tissue thickening seen in the left pericolonic region. The degree of persistent soft tissues thickening in the pericolonic space further raises suspicions for malignancy. Further evaluation with colonoscopy should be performed. 2. Left retroperitoneal abscess drain unchanged in position. Anterior left abdominal drain again seen terminating within small bowel loop. 3. 2.6 cm mildly sclerotic lesion noted in the L2 vertebral body. Further evaluation with contrast enhanced lumbar spine MRI should be performed.   02/04/2021 Imaging   EXAM: CT ABDOMEN AND PELVIS WITH CONTRAST  IMPRESSION: No new abdominopelvic collections or abscess development in the 1 month interval.   Left anterior drain remains within a loop of small bowel, unchanged.   Left lateral abscess drain remains in the retroperitoneal space adjacent to the  iliopsoas muscle with a small amount of residual fluid and air  but no measurable collection.   Stable soft tissue prominence and pericolonic strandy edema/inflammation about the left descending colon compatible with residual diverticulitis/colitis. Difficult to exclude underlying transmural lesion.   04/01/2021 Imaging   EXAM: CT ABDOMEN AND PELVIS WITH CONTRAST  IMPRESSION: There appears to be significant enhancement and wall thickening involving the descending colon with some degree of traction and involvement of adjacent small bowel loops. This is consistent with the history of diverticulitis and perforation. Stable position of percutaneous drainage catheter is seen adjacent to left psoas muscle with no significant residual fluid remaining. The other percutaneous drainage catheter that was previously noted to be within small bowel loop on prior exam in the left lower quadrant, has significantly retracted and appears to be outside of the peritoneal space at this time.   Since the prior exam, there does appear to be some degree of rotation involving mesenteric vessels and structures in the right lower quadrant, suggesting partial volvulus or malrotation. There is the interval development of several lymph nodes in this area, most likely inflammatory or reactive in etiology. Mild amount of free fluid is also noted in the pelvis. However, no significant bowel wall thickening or dilatation is seen in this area. These results will be called to the ordering clinician or representative by the Radiologist Assistant, and communication documented in the PACS or zVision Dashboard.   Stable uterine fibroid.   Hepatic steatosis.   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   Cholelithiasis.   04/10/2021 Imaging   EXAM: CT ANGIOGRAPHY CHEST CT ABDOMEN AND PELVIS WITH CONTRAST  IMPRESSION: 1. Again seen are findings compatible with descending colon diverticulitis with perforation. Free air and  inflammation have increased in the interval. 2. New lobulated enhancing fluid collection posterior to this segment of inflamed colon measuring 8.5 x 3.5 x 10.0 cm. This collection now invades the adjacent iliopsoas muscle as well as extends through the left lateral abdominal wall. The tip of the drainage catheter is in this collection. Findings are compatible with abscess. 3. Anterior left percutaneous drainage catheter tip has been pulled back and is now within the subcutaneous tissues. 4. Trace free fluid. 5. No pulmonary embolism.  No acute cardiopulmonary process. 6. Cholelithiasis. 7. Fatty infiltration of the liver.   04/15/2021 Definitive Surgery   FINAL MICROSCOPIC DIAGNOSIS:   A. SMALL BOWEL, RESECTION:  - Adenocarcinoma.  - No carcinoma identified in 1 lymph node.   B. PERFORATED LEFT COLON, RESECTION:  - Moderately differentiated colonic adenocarcinoma.  - Tumor extends into pericolonic adipose tissue, and is strongly  suggestive of invasion into small bowel.  See oncology table/comments.  - No carcinoma identified in 4 lymph nodes (0/4).  - Tubular adenoma with high-grade dysplasia, 1.  - Tubular adenomas with low grade dysplasia, 3.   Comments: The size of the tumor is difficult to estimate secondary to the disrupted nature of the specimen, as well as the infiltrative nature of the tumor.  Tumor can be identified as definitely invading into the pericolonic adipose tissue (block B4), but given the extreme disruption of the tissue, and the involvement of the small bowel by what appears to be a colonic adenocarcinoma, I favor perforation of the large bowel with direct invasion into the small bowel.  Accordingly, I believe this is best regarded as a pT4b lesion.   ADDENDUM:  Mismatch Repair Protein (IHC)  SUMMARY INTERPRETATION: ABNORMAL  There is loss of the major MMR protein [name]6: This indicates a high probability that a hereditary germline mutation  is present and  referral to genetic counseling is warranted. It is recommended that the loss of protein expression be correlated with molecular based microsatellite instability testing.   IHC EXPRESSION RESULTS  TEST           RESULT  MLH1:          Preserved nuclear expression  [name]2:          Preserved nuclear expression  [name]6:          LOSS OF NUCLEAR EXPRESSION  PMS2:          Preserved nuclear expression    04/15/2021 Cancer Staging   Staging form: Colon and Rectum, AJCC 8th Edition - Pathologic stage from 04/15/2021: Stage IIC (pT4b, pN0, cM0) - Signed by Mikayla Rios Campbell, MD on 06/12/2021 Stage prefix: Initial diagnosis Total positive nodes: 0 Histologic grading system: 4 grade system Histologic grade (G): G2 Residual tumor (R): R0 - None   04/29/2021 Imaging   EXAM: CT ABDOMEN AND PELVIS WITH CONTRAST  IMPRESSION: Continued left retroperitoneal abscesses inferior to the left kidney, involving the left psoas muscle and left abdominal wall musculature. Overall size is decreased since prior study. Interval removal of left lower quadrant abscess drainage catheter.   New fluid collection in the cul-de-sac and wrapping around the uterus concerning for abscess.   Cholelithiasis.   Small left pleural effusion.  Bibasilar atelectasis.   Stable left adrenal adenoma.  ADDENDUM: After discussing the case with Dr. Pierre Briar in interventional radiology, it was noted that the fluid in the pelvis originally thought to be in the cul-de-sac is likely within the vagina, best seen on sagittal imaging. Recommend speculum exam.   Also, in the left lateral wall in the area of prior abscess in the lateral wall abdominal musculature, some of the abdomen soft tissue appears to be enhancing. While this could be infectious, cannot exclude tumor seeding in the left lateral abdominal wall, measuring 6.9 x 3.8 cm on image 64 of series 2.   06/16/2021 Imaging   EXAM: CT ABDOMEN AND PELVIS WITH  CONTRAST  IMPRESSION: Increased size of bulky soft tissue masses involving the left psoas muscle and left lateral abdominal wall soft tissues, consistent with progressive metastatic disease.   Significant progression of multiple peritoneal masses throughout the abdomen and pelvis, consistent with peritoneal carcinomatosis.   New mild retroperitoneal lymphadenopathy, consistent with metastatic disease.   Stable uterine fibroid and left adrenal adenoma.   Cholelithiasis, without evidence of cholecystitis.   06/22/2021 - 08/19/2021 Chemotherapy   Patient is on Treatment Plan : COLORECTAL FOLFOX q14d x 6 months     06/22/2021 Tumor Marker   Patient's tumor was tested for the following markers: CEA. Results of the tumor marker test revealed 87.41.   07/10/2021 Pathology Results   FINAL MICROSCOPIC DIAGNOSIS:   A. PERITONEAL MASS, LEFT UPPER ABDOMINAL QUADRANT, BIOPSY:  -  Metastatic adenocarcinoma with necrosis, histologically consistent with colon primary.     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 The report date is 07/16/2021. The remainder of testing was negative/normal.  The CancerNext-Expanded + RNAinsight gene panel offered by [employer] and includes sequencing and rearrangement analysis for the following 77 genes: IP, ALK, APC*, ATM*, AXIN2, BAP1, BARD1, BLM, BMPR1A, BRCA1*, BRCA2*, BRIP1*, CDC73, CDH1*,CDK4, CDKN1B, CDKN2A, CHEK2*, CTNNA1, DICER1, FANCC, FH, FLCN, GALNT12, KIF1B, LZTR1, MAX, MEN1, MET, MLH1*, [name]2*, [name]3, [name]6*, MUTYH*, NBN, NF1*, NF2, NTHL1, PALB2*, PHOX2B, PMS2*, POT1, PRKAR1A, PTCH1, PTEN*, RAD51C*,  RAD51D*,RB1, RECQL, RET, SDHA, SDHAF2, SDHB, SDHC, SDHD, SMAD4, SMARCA4, SMARCB1, SMARCE1, STK11, SUFU, TMEM127, TP53*,TSC1, TSC2, VHL and XRCC2 (sequencing and deletion/duplication); EGFR, EGLN1, HOXB13, KIT, MITF, PDGFRA, POLD1 and POLE (sequencing only); EPCAM and GREM1  (deletion/duplication only).   08/28/2021 Imaging   EXAM: CT CHEST, ABDOMEN, AND PELVIS WITH CONTRAST  IMPRESSION: 1. Continued interval progression of multiple metastatic soft tissue nodules/masses in the abdomen and pelvis. 2. No evidence for metastatic disease in the chest. 3. Similar appearance of the subtle lesion in the L2 vertebral body, suspicious for metastatic involvement. 4. Cholelithiasis.   09/03/2021 -  Chemotherapy   Patient is on Treatment Plan : COLORECTAL Pembrolizumab  (200) q21d     09/08/2021 - 02/01/2022 Chemotherapy   Patient is on Treatment Plan : COLORECTAL Pembrolizumab  (200) q21d     01/08/2022 Imaging   CT CAP IMPRESSION: 1. Mixed response to therapy. Interval decrease in size of the conglomerate multilobulated mass extending from the left psoas into the left posterior pararenal space through the left lateral abdominal wall along the tract of the previously placed percutaneous drainage catheter. Interval decrease in size of right adnexal mass and omental masses within the right lower quadrant of the abdomen. Interval development of peritoneal carcinomatosis with omental caking within the left lower quadrant of the abdomen and left subdiaphragmatic region adjacent to the spleen and along the left pericolic gutter as well as development of ascites appearing loculated within the anterior peritoneum. 2. Interval development of mild left hydronephrosis secondary to stricturing of the mid left ureter. 3. Stable left adrenal metastasis. 4. Stable sclerotic lesion within the L2 vertebral body. No new lytic or blastic bone lesions are seen. 5. Cholelithiasis. 6. No evidence of intrathoracic metastatic disease. 7. 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 Moderate colonic stool burden. No evidence of obstruction.   03/15/2022 Imaging   IMPRESSION: 1. Status post sigmoid colon resection with left  lower quadrant end colostomy. 2. Multicystic bilateral ovarian lesions are slightly increased in size. 3. Diminished, small volume of loculated appearing ascites throughout the abdomen and pelvis, with similar appearance of peritoneal thickening and nodularity throughout. 4. Heterogeneously calcified, conglomerate masses in the left retroperitoneum and abdominal wall are slightly diminished in size. 5. Left adrenal lesion not significantly changed. 6. Unchanged faintly sclerotic metastatic lesion of L2. 7. Constellation of findings is consistent with mixed response to treatment however with overall little interval change. 8. Minimal residual left hydronephrosis and proximal hydroureter, improved compared to prior examination. 9. Cholelithiasis. 10. Coronary artery disease, significantly advanced for patient age.   08/04/2022 Imaging    IMPRESSION: CT CHEST:   1. No developing mass lesion, fluid collection or lymph node enlargement in the thorax. Stable tiny 3 mm lung nodule. 2. Coronary artery calcifications. Please correlate for other coronary risk factors. 3. Chest port   CT ABDOMEN AND PELVIS:   1. Extensive surgical changes identified. Left lower quadrant ostomy, diverting colostomy. Surgical changes along loops of small bowel in the right hemiabdomen. 2. Once again there are areas of nodular tissue extending along the left lateral abdominal wall, into the retroperitoneum which are similar to previous. The amount of adjacent fluid in these locations has improved. 3. No new mass lesion, fluid collection or lymph node enlargement in the abdomen or pelvis. 4. Gallstones. 5. Enlarged uterus with fibroids. Bilateral complex cystic areas are once again seen and based on appearance differential includes dilated fallopian tubes, hydrosalpinx versus true cystic  lesions. Please correlate for any known history or prior workup 6. Limited evaluation for solid organ pathology metastatic  disease without the advantage of IV contrast     05/04/2023 PET scan   PET - restaging - skull base to thigh  IMPRESSION: 1. Subcentimeter peritoneal nodules in the left abdomen have decreased in size and hypermetabolism from 04/26/2022, compatible with treated metastatic disease. 2. Chronic focal hypermetabolism associated with a gallbladder nodule. Malignancy cannot be excluded. 3. Small hypermetabolic to borderline hypermetabolic external iliac and left inguinal lymph nodes and minimally hypermetabolic small bilateral cervical lymph nodes, nonspecific. Metastatic disease not excluded. Recommend attention on follow-up. 4. Left adrenal adenoma. 5. Cystic right adnexal mass is inadequately characterized. Because this lesion is not adequately characterized, prompt US  is recommended for further evaluation. Note: This recommendation does not apply to premenarchal patients and to those with increased risk (genetic, family history, elevated tumor markers or other high-risk factors) of ovarian cancer. Reference: JACR 2020 Feb; 17(2):248-254 6. 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 No developing mass lesion, fluid collection or lymph node enlargement in the thorax. Stable tiny 3 mm lung nodule. 2. Coronary artery calcifications. Please correlate for other coronary risk factors. 3. Chest port   CT ABDOMEN AND PELVIS:   1. Extensive surgical changes identified. Left lower quadrant ostomy, diverting colostomy. Surgical changes along loops of small bowel in the right hemiabdomen. 2. Once again there are areas of nodular tissue extending along the left lateral abdominal wall, into the  retroperitoneum which are similar to previous. The amount of adjacent fluid in these locations has improved. 3. No new mass lesion, fluid collection or lymph node enlargement in the abdomen or pelvis. 4. Gallstones. 5. Enlarged uterus with fibroids. Bilateral complex cystic areas are once again seen and based on appearance differential includes dilated fallopian tubes, hydrosalpinx versus true cystic lesions. Please correlate for any known history or prior workup 6. Limited evaluation for solid organ pathology metastatic disease without the advantage of IV contrast        Discussed the use of AI scribe software for clinical note transcription with the patient, who gave verbal consent to proceed.  History of Present Illness Mikayla Rios is a 38 year old female with metastatic colon cancer who presents for follow-up.  She experiences ongoing body and back pain. Recent episodes of lightheadedness and dizziness coincide with changes in her heart medications, including an increase in valsartan , discontinuation of metoprolol , and initiation of Coreg. She continues to take amlodipine . She feels consistently thirsty and suspects dehydration might be contributing to her symptoms.  Her menstrual periods are heavy and painful, with irregular cycles and a recent pattern of skipping months. Menstruation resumed earlier this year after a year-long absence following chemotherapy. She has not been taking iron  supplements recently but has taken prenatal vitamins in the past due to low iron  levels. She has received IV iron  infusions, most recently in 2023.  A recent circulating tumor DNA test (Guardian review) came back negative. Her PET scan showed some uptake. Her tumor markers have consistently been negative. Further monitoring and testing are scheduled in the coming months.     All other  systems were reviewed with the patient and are negative.  MEDICAL HISTORY:  Past Medical History:  Diagnosis  Date   Allergy    seasonal   Anemia    Blood infection 1985   Blood transfusion without reported diagnosis    colon ca 04/2021   Diverticulitis    Family history of breast cancer    Family history of colon cancer    Family history of colon cancer    Family history of stomach cancer    Hypertension    Obesity    Sleep apnea     SURGICAL HISTORY: Past Surgical History:  Procedure Laterality Date   COLECTOMY WITH COLOSTOMY CREATION/HARTMANN PROCEDURE N/A 04/15/2021   Procedure: COLOSTOMY CREATION/HARTMANN PROCEDURE;  Surgeon: Mikayla Champagne, MD;  Location: [facility];  Service: General;  Laterality: N/A;   IR CATHETER TUBE CHANGE  12/12/2020   IR CATHETER TUBE CHANGE  01/27/2021   IR CATHETER TUBE CHANGE  02/19/2021   IR CATHETER TUBE CHANGE  04/13/2021   IR IMAGING GUIDED PORT INSERTION  10/02/2021   IR RADIOLOGIST EVAL & MGMT  11/27/2020   IR RADIOLOGIST EVAL & MGMT  12/11/2020   IR RADIOLOGIST EVAL & MGMT  01/07/2021   IR RADIOLOGIST EVAL & MGMT  02/04/2021   IR SINUS/FIST TUBE CHK-NON GI  12/12/2020   IR SINUS/FIST TUBE CHK-NON GI  02/19/2021   LAPAROSCOPIC PARTIAL COLECTOMY N/A 04/15/2021   Procedure: LAPAROSCOPIC ASSISTED HARTMANN RESECTION;  Surgeon: Mikayla Champagne, MD;  Location: [facility];  Service: General;  Laterality: N/A;    I have reviewed the social history and family history with the patient and they are unchanged from previous note.  ALLERGIES:  is allergic to chlorhexidine  gluconate, shellfish allergy, and penicillins.  MEDICATIONS:  Current Outpatient Medications  Medication Sig Dispense Refill   acetaminophen -codeine  (TYLENOL  #3) 300-30 MG tablet Take 1 tablet by mouth every 8 (eight) hours as needed for moderate pain (pain score 4-6). 30 tablet 0   ALPRAZolam  (XANAX ) 0.25 MG tablet Take 1 tablet (0.25 mg total) by mouth at bedtime as needed for anxiety. 30 tablet 0   amLODipine  (NORVASC ) 10 MG tablet TAKE 1 TABLET BY MOUTH EVERY DAY 90 tablet 3   aspirin  EC 81 MG  tablet Take 1 tablet (81 mg total) by mouth daily. Swallow whole.     atorvastatin  (LIPITOR) 40 MG tablet Take 1 tablet (40 mg total) by mouth daily. 30 tablet 6   carvedilol (COREG) 12.5 MG tablet Take 1.5 tablets (18.75 mg total) by mouth 2 (two) times daily. 45 tablet 3   diclofenac  Sodium (VOLTAREN ) 1 % GEL Apply 2 g topically 4 (four) times daily. 100 g 2   dicyclomine  (BENTYL ) 10 MG capsule Take 1 capsule (10 mg total) by mouth every 8 (eight) hours as needed for spasms. 20 capsule 0   furosemide  (LASIX ) 20 MG tablet TAKE 1 TABLET BY MOUTH EVERY DAY AS NEEDED 90 tablet 1   gabapentin  (NEURONTIN ) 300 MG capsule Take 1 capsule (300 mg total) by mouth at bedtime. 90 capsule 1   KLOR-CON  M20 20 MEQ tablet TAKE 1 TABLET BY MOUTH EVERY DAY 90 tablet 1   lidocaine -prilocaine  (EMLA ) cream Apply 1 Application topically as needed. 30 g 3   methocarbamol  (ROBAXIN -750) 750 MG tablet Take 1 tablet (750 mg total) by mouth every 8 (eight) hours as needed for muscle spasms. 60 tablet 0   prochlorperazine  (COMPAZINE ) 10 MG tablet Take 1 tablet (10 mg total) by mouth every 8 (eight)  hours as needed for nausea or vomiting. 30 tablet 0   traZODone  (DESYREL ) 50 MG tablet TAKE 1-2 TABLETS BY MOUTH AT BEDTIME AS NEEDED FOR SLEEP. 180 tablet 1   triamcinolone  cream (KENALOG ) 0.1 % APPLY TO AFFECTED AREA TWICE DAILY AS NEEDED 30 g 0   valsartan  (DIOVAN ) 320 MG tablet Take 1 tablet (320 mg total) by mouth daily. 30 tablet 6   No current facility-administered medications for this visit.   Facility-Administered Medications Ordered in Other Visits  Medication Dose Route Frequency Provider Last Rate Last Admin   fentaNYL  (SUBLIMAZE ) injection   Intravenous PRN Mir, Farhaan R, MD   50 mcg at 10/02/21 1436   midazolam  (VERSED ) injection   Intravenous PRN Mir, Farhaan R, MD   1 mg at 10/02/21 1436    PHYSICAL EXAMINATION: ECOG PERFORMANCE STATUS: 1 - Symptomatic but completely ambulatory  Vitals:   11/03/23 1352   BP: 122/80  Pulse: 92  Resp: 15  Temp: 97.7 F (36.5 C)  SpO2: 99%   Wt Readings from Last 3 Encounters:  11/03/23 (!) 355 lb 12.8 oz (161.4 kg)  10/13/23 (!) 350 lb 8 oz (159 kg)  09/22/23 (!) 346 lb 9.6 oz (157.2 kg)     GENERAL: obese young female, alert, no distress and comfortable SKIN: skin color, texture, turgor are normal, no rashes or significant lesions EYES: normal, Conjunctiva are pink and non-injected, sclera clear NECK: supple, thyroid  normal size, non-tender, without nodularity LYMPH:  no palpable lymphadenopathy in the cervical, axillary  LUNGS: clear to auscultation and percussion with normal breathing effort HEART: regular rate & rhythm and no murmurs and no lower extremity edema ABDOMEN:abdomen soft, non-tender and normal bowel sounds Musculoskeletal:no cyanosis of digits and no clubbing  NEURO: alert & oriented x 3 with fluent speech, no focal motor/sensory deficits  Physical Exam    LABORATORY DATA:  I have reviewed the data as listed    Latest Ref Rng & Units 11/03/2023    1:35 PM 10/13/2023    1:15 PM 09/22/2023    1:01 PM  CBC  WBC 4.0 - 10.5 K/uL 6.4  7.0  6.8   Hemoglobin 12.0 - 15.0 g/dL 16.1  09.6  04.5   Hematocrit 36.0 - 46.0 % 31.6  32.7  35.4   Platelets 150 - 400 K/uL 414  422  453         Latest Ref Rng & Units 10/13/2023    1:15 PM 09/22/2023    1:01 PM 09/01/2023    2:32 PM  CMP  Glucose 70 - 99 mg/dL 91  94  90   BUN 6 - 20 mg/dL 15  21  20    Creatinine 0.44 - 1.00 mg/dL 4.09  8.11  9.14   Sodium 135 - 145 mmol/L 138  139  137   Potassium 3.5 - 5.1 mmol/L 4.1  4.3  4.6   Chloride 98 - 111 mmol/L 109  109  107   CO2 22 - 32 mmol/L 24  25  23    Calcium  8.9 - 10.3 mg/dL 8.7  8.6  9.0   Total Protein 6.5 - 8.1 g/dL 7.2  7.3  7.4   Total Bilirubin 0.0 - 1.2 mg/dL 0.2  0.3  0.2   Alkaline Phos 38 - 126 U/L 51  42  41   AST 15 - 41 U/L 13  14  13    ALT 0 - 44 U/L 15  12  10  RADIOGRAPHIC STUDIES: I have personally  reviewed the radiological images as listed and agreed with the findings in the report. No results found.    No orders of the defined types were placed in this encounter.  All questions were answered. The patient knows to call the clinic with any problems, questions or concerns. No barriers to learning was detected. The total time spent in the appointment was 30 minutes, including review of chart and various tests results, discussions about plan of care and coordination of care plan     Mikayla Rios Ansley, MD 11/03/2023

## 2023-11-03 NOTE — Patient Instructions (Signed)

## 2023-11-07 ENCOUNTER — Ambulatory Visit: Admitting: Student

## 2023-11-08 ENCOUNTER — Other Ambulatory Visit: Payer: Self-pay | Admitting: Student

## 2023-11-08 DIAGNOSIS — I10 Essential (primary) hypertension: Secondary | ICD-10-CM

## 2023-11-09 ENCOUNTER — Encounter: Payer: Self-pay | Admitting: Internal Medicine

## 2023-11-11 ENCOUNTER — Ambulatory Visit (HOSPITAL_COMMUNITY)
Admission: RE | Admit: 2023-11-11 | Discharge: 2023-11-11 | Disposition: A | Discharge status: Home / Self Care | Source: Ambulatory Visit | Attending: Nurse Practitioner | Admitting: Nurse Practitioner

## 2023-11-11 DIAGNOSIS — L24B3 Irritant contact dermatitis related to fecal or urinary stoma or fistula: Secondary | ICD-10-CM | POA: Diagnosis not present

## 2023-11-11 DIAGNOSIS — Z433 Encounter for attention to colostomy: Secondary | ICD-10-CM | POA: Diagnosis not present

## 2023-11-11 DIAGNOSIS — Z933 Colostomy status: Secondary | ICD-10-CM | POA: Diagnosis not present

## 2023-11-11 MED ORDER — FLUTICASONE PROPIONATE 50 MCG/ACT NA SUSP: 1.0000 | Freq: Every day | NASAL | Status: DC

## 2023-11-11 NOTE — Progress Notes (Signed)
 Mikayla Rios   Reason for visit:  LUQ colostomy HPI:  Colon cancer with loop colostomy Past Medical History:  Diagnosis Date   Allergy    seasonal   Anemia    Blood infection 1985   Blood transfusion without reported diagnosis    colon ca 04/2021   Diverticulitis    Family history of breast cancer    Family history of colon cancer    Family history of colon cancer    Family history of stomach cancer    Hypertension    Obesity    Sleep apnea    Family History  Problem Relation Age of Onset   Diabetes Mother    Hypertension Mother    Colon cancer Mother        dx at age 78   Breast cancer Mother    Congestive Heart Failure Father    Hypertension Father    Diabetes Maternal Grandmother    Hypertension Maternal Grandmother    Diabetes Maternal Grandfather    Kidney disease Maternal Grandfather    Diabetes Paternal Grandmother    Autism Son    Stomach cancer Maternal Uncle    Heart disease Paternal Aunt    Colon polyps Half-Sister        colectomy   Ovarian cancer Neg Hx    Endometrial cancer Neg Hx    Pancreatic cancer Neg Hx    Prostate cancer Neg Hx    Allergies  Allergen Reactions   Chlorhexidine  Gluconate Hives and Itching    CHG wipes cause itching and hives requiring benadryl    Shellfish Allergy Hives   Penicillins Hives and Rash    Has patient had a PCN reaction causing immediate rash, facial/tongue/throat swelling, SOB or lightheadedness with hypotension: No Has patient had a PCN reaction causing severe rash involving mucus membranes or skin necrosis: hives Has patient had a PCN reaction that required hospitalization No Has patient had a PCN reaction occurring within the last 10 years: yes If all of the above answers are "NO", then may proceed with Cephalosporin use.    Current Outpatient Medications  Medication Sig Dispense Refill Last Dose/Taking   acetaminophen -codeine  (TYLENOL  #3) 300-30 MG tablet Take 1 tablet by mouth every 8  (eight) hours as needed for moderate pain (pain score 4-6). 30 tablet 0    ALPRAZolam  (XANAX ) 0.25 MG tablet Take 1 tablet (0.25 mg total) by mouth at bedtime as needed for anxiety. 30 tablet 0    amLODipine  (NORVASC ) 10 MG tablet TAKE 1 TABLET BY MOUTH EVERY DAY 90 tablet 3    aspirin  EC 81 MG tablet Take 1 tablet (81 mg total) by mouth daily. Swallow whole.      atorvastatin  (LIPITOR) 40 MG tablet Take 1 tablet (40 mg total) by mouth daily. 30 tablet 6    carvedilol  (COREG ) 12.5 MG tablet Take 1.5 tablets (18.75 mg total) by mouth 2 (two) times daily. 45 tablet 3    diclofenac  Sodium (VOLTAREN ) 1 % GEL Apply 2 g topically 4 (four) times daily. 100 g 2    dicyclomine  (BENTYL ) 10 MG capsule Take 1 capsule (10 mg total) by mouth every 8 (eight) hours as needed for spasms. 20 capsule 0    furosemide  (LASIX ) 20 MG tablet TAKE 1 TABLET BY MOUTH EVERY DAY AS NEEDED 90 tablet 1    gabapentin  (NEURONTIN ) 300 MG capsule Take 1 capsule (300 mg total) by mouth at bedtime. 90 capsule 1    KLOR-CON  M20 20 MEQ  tablet TAKE 1 TABLET BY MOUTH EVERY DAY 90 tablet 1    lidocaine -prilocaine  (EMLA ) cream Apply 1 Application topically as needed. 30 g 3    methocarbamol  (ROBAXIN -750) 750 MG tablet Take 1 tablet (750 mg total) by mouth every 8 (eight) hours as needed for muscle spasms. 60 tablet 0    prochlorperazine  (COMPAZINE ) 10 MG tablet Take 1 tablet (10 mg total) by mouth every 8 (eight) hours as needed for nausea or vomiting. 30 tablet 0    traZODone  (DESYREL ) 50 MG tablet TAKE 1-2 TABLETS BY MOUTH AT BEDTIME AS NEEDED FOR SLEEP. 180 tablet 1    triamcinolone  cream (KENALOG ) 0.1 % APPLY TO AFFECTED AREA TWICE DAILY AS NEEDED 30 g 0    valsartan  (DIOVAN ) 320 MG tablet Take 1 tablet (320 mg total) by mouth daily. 30 tablet 6    No current facility-administered medications for this encounter.   Facility-Administered Medications Ordered in Other Encounters  Medication Dose Route Frequency Provider Last Rate Last  Admin   fentaNYL  (SUBLIMAZE ) injection   Intravenous PRN Mir, Farhaan R, MD   50 mcg at 10/02/21 1436   midazolam  (VERSED ) injection   Intravenous PRN Mir, Farhaan R, MD   1 mg at 10/02/21 1436   ROS  Review of Systems  Gastrointestinal:        LUQ colostomy  Musculoskeletal:  Positive for gait problem.       Uses wheelchair in hospital  Skin:  Positive for color change and rash.  Psychiatric/Behavioral: Negative.    All other systems reviewed and are negative.  Vital signs:  BP 130/82 (BP Location: Right Arm)   Pulse 92   Temp 99.5 F (37.5 C) (Oral)   Resp 19   SpO2 99%  Exam:  Physical Exam Vitals reviewed.  Constitutional:      Appearance: She is obese.  HENT:     Mouth/Throat:     Mouth: Mucous membranes are moist.  Cardiovascular:     Rate and Rhythm: Normal rate and regular rhythm.  Pulmonary:     Effort: Pulmonary effort is normal.     Breath sounds: Normal breath sounds.  Abdominal:     Palpations: Abdomen is soft.     Comments: Obese abdomen with LUQ colostomy  Skin:    General: Skin is warm and dry.     Findings: Erythema present.  Neurological:     Mental Status: She is alert and oriented to person, place, and time. Mental status is at baseline.  Psychiatric:        Mood and Affect: Mood normal.        Behavior: Behavior normal.     Stoma type/location:  LUQ colostomy Stomal assessment/size:  1 1/2" nearly flush pink patent and producing soft brown stool Peristomal assessment:  some erythema and irritation to peristomal skin  Rounded obese abdomen Treatment options for stomal/peristomal skin: stoma powder and skin prep Barrier ring Will implement a tapeless 1 piece convex pouch . Adhesive to current pouch is stripping skin. We discuss push/pull removal technique for atraumatic removal.   Output: soft brown stool Ostomy pouching: 1pc.coloplast convex with barrier ring  Education provided:  I remove old pouch, demonstrate stoma powder and skin prep  with barrier ring I apply 1piece convex tapeless pouch and apply ostomy belt for added security    Impression/dx  Colostomy Irritant contact dermatitis Discussion  See above  new tapeless convex pouch with ostomy belt.  Plan  See back 2 weeks to assess.  Will set up with [facility] for ostomy supplies.     Visit time: 50 minutes.   Mikayla Cain FNP-BC

## 2023-11-15 ENCOUNTER — Other Ambulatory Visit (HOSPITAL_COMMUNITY): Payer: Self-pay | Admitting: Nurse Practitioner

## 2023-11-15 DIAGNOSIS — E6609 Other obesity due to excess calories: Secondary | ICD-10-CM | POA: Insufficient documentation

## 2023-11-15 DIAGNOSIS — Z433 Encounter for attention to colostomy: Secondary | ICD-10-CM

## 2023-11-15 DIAGNOSIS — L24B3 Irritant contact dermatitis related to fecal or urinary stoma or fistula: Secondary | ICD-10-CM

## 2023-11-15 NOTE — Discharge Instructions (Signed)
 Coloplast 1 piece convex  Stoma powder Skin prep Barrier ring Ostomy belt Enroll with [facility]

## 2023-11-16 ENCOUNTER — Telehealth: Payer: Self-pay

## 2023-11-16 NOTE — Telephone Encounter (Signed)
**Note De-Identified Kerrilynn Derenzo Obfuscation** I started a [facility] through the [employer] provider portal and it is currently pending review.  Order ID: [id number]  Anticipated Determination Date:11/18/2023

## 2023-11-17 DIAGNOSIS — Z933 Colostomy status: Secondary | ICD-10-CM | POA: Diagnosis not present

## 2023-11-24 ENCOUNTER — Inpatient Hospital Stay

## 2023-11-24 ENCOUNTER — Other Ambulatory Visit: Payer: Self-pay | Admitting: Nurse Practitioner

## 2023-11-24 ENCOUNTER — Encounter: Payer: Self-pay | Admitting: Hematology

## 2023-11-24 ENCOUNTER — Other Ambulatory Visit: Payer: Self-pay | Admitting: Student

## 2023-11-24 ENCOUNTER — Inpatient Hospital Stay: Attending: Hematology

## 2023-11-24 VITALS — BP 127/71 | HR 74 | Temp 99.0°F | Resp 16

## 2023-11-24 DIAGNOSIS — C187 Malignant neoplasm of sigmoid colon: Secondary | ICD-10-CM

## 2023-11-24 DIAGNOSIS — C786 Secondary malignant neoplasm of retroperitoneum and peritoneum: Secondary | ICD-10-CM

## 2023-11-24 DIAGNOSIS — Z452 Encounter for adjustment and management of vascular access device: Secondary | ICD-10-CM

## 2023-11-24 DIAGNOSIS — D500 Iron deficiency anemia secondary to blood loss (chronic): Secondary | ICD-10-CM

## 2023-11-24 DIAGNOSIS — Z5112 Encounter for antineoplastic immunotherapy: Secondary | ICD-10-CM | POA: Diagnosis not present

## 2023-11-24 DIAGNOSIS — I10 Essential (primary) hypertension: Secondary | ICD-10-CM

## 2023-11-24 LAB — CMP (CANCER CENTER ONLY)
ALT: 10 U/L (ref 0–44)
AST: 10 U/L — ABNORMAL LOW (ref 15–41)
Albumin: 3.9 g/dL (ref 3.5–5.0)
Alkaline Phosphatase: 46 U/L (ref 38–126)
Anion gap: 7 (ref 5–15)
BUN: 14 mg/dL (ref 6–20)
CO2: 21 mmol/L — ABNORMAL LOW (ref 22–32)
Calcium: 8.6 mg/dL — ABNORMAL LOW (ref 8.9–10.3)
Chloride: 109 mmol/L (ref 98–111)
Creatinine: 0.75 mg/dL (ref 0.44–1.00)
GFR, Estimated: >60 mL/min (ref >60)
Glucose, Bld: 94 mg/dL (ref 70–99)
Potassium: 3.8 mmol/L (ref 3.5–5.1)
Sodium: 137 mmol/L (ref 135–145)
Total Bilirubin: 0.4 mg/dL (ref 0.0–1.2)
Total Protein: 7.2 g/dL (ref 6.5–8.1)

## 2023-11-24 LAB — CBC WITH DIFFERENTIAL (CANCER CENTER ONLY)
Abs Immature Granulocytes: 0.02 10*3/uL (ref 0.00–0.07)
Basophils Absolute: 0 10*3/uL (ref 0.0–0.1)
Basophils Relative: 1 %
Eosinophils Absolute: 0.1 10*3/uL (ref 0.0–0.5)
Eosinophils Relative: 2 %
HCT: 31.7 % — ABNORMAL LOW (ref 36.0–46.0)
Hemoglobin: 10.3 g/dL — ABNORMAL LOW (ref 12.0–15.0)
Immature Granulocytes: 0 %
Lymphocytes Relative: 28 %
Lymphs Abs: 1.6 10*3/uL (ref 0.7–4.0)
MCH: 26.3 pg (ref 26.0–34.0)
MCHC: 32.5 g/dL (ref 30.0–36.0)
MCV: 81.1 fL (ref 80.0–100.0)
Monocytes Absolute: 0.4 10*3/uL (ref 0.1–1.0)
Monocytes Relative: 6 %
Neutro Abs: 3.7 10*3/uL (ref 1.7–7.7)
Neutrophils Relative %: 63 %
Platelet Count: 412 10*3/uL — ABNORMAL HIGH (ref 150–400)
RBC: 3.91 MIL/uL (ref 3.87–5.11)
RDW: 15.6 % — ABNORMAL HIGH (ref 11.5–15.5)
WBC Count: 5.8 10*3/uL (ref 4.0–10.5)
nRBC: 0 % (ref 0.0–0.2)

## 2023-11-24 LAB — FERRITIN: Ferritin: 17 ng/mL (ref 11–307)

## 2023-11-24 LAB — PREGNANCY, URINE: Preg Test, Ur: NEGATIVE

## 2023-11-24 LAB — CEA (ACCESS): CEA (CHCC): <1 ng/mL (ref 0.00–5.00)

## 2023-11-24 MED ORDER — SODIUM CHLORIDE 0.9 % IV SOLN
200.0000 mg | Freq: Once | INTRAVENOUS | Status: AC
  Administered 2023-11-24: 200 mg via INTRAVENOUS
  Filled 2023-11-24: qty 200

## 2023-11-24 MED ORDER — SODIUM CHLORIDE 0.9% FLUSH
10.0000 mL | Freq: Once | INTRAVENOUS | Status: AC
  Administered 2023-11-24: 10 mL

## 2023-11-24 MED ORDER — SODIUM CHLORIDE 0.9 % IV SOLN: Freq: Once | INTRAVENOUS | Status: AC

## 2023-11-24 NOTE — Progress Notes (Signed)
 Patient here for port flush/labs and infusion.  Wanted to know if her treatment caused her to have body cramps?  Secure chatted Heather/PA.  States from what she read it should not.  It could cause body pain.  Patient informed of the above.

## 2023-11-24 NOTE — Patient Instructions (Signed)

## 2023-11-25 ENCOUNTER — Ambulatory Visit (HOSPITAL_COMMUNITY): Admitting: Nurse Practitioner

## 2023-11-28 ENCOUNTER — Encounter: Payer: Self-pay | Admitting: Hematology

## 2023-12-01 ENCOUNTER — Encounter: Payer: Managed Care, Other (non HMO) | Admitting: Internal Medicine

## 2023-12-03 ENCOUNTER — Other Ambulatory Visit: Payer: Self-pay | Admitting: Student

## 2023-12-03 DIAGNOSIS — I10 Essential (primary) hypertension: Secondary | ICD-10-CM

## 2023-12-06 ENCOUNTER — Other Ambulatory Visit: Payer: Self-pay | Admitting: Student

## 2023-12-06 DIAGNOSIS — I10 Essential (primary) hypertension: Secondary | ICD-10-CM

## 2023-12-06 NOTE — Progress Notes (Deleted)
 Cardiology Office Note:    Date:  12/06/2023   ID:  Mikayla Rios, DOB 06-Jan-1986, MRN [id number]  PCP:  Jarold Medici, MD  Cardiologist:  Lonni LITTIE Nanas, MD { Click to update primary MD,subspecialty MD or APP then REFRESH:1}    Referring MD: Jarold Medici, MD   Chief Complaint: follow-up of hypertension  History of Present Illness:    Mikayla Rios is a 38 y.o. female with a history of coronary artery calcifications noted on prior CTs, palpitations/tachycardia with no significant arrhythmias noted on monitor in 11/2019 hypertension, hyperlipidemia, obstructive sleep apnea, perforated diverticulitis with retroperitoneal abscess s/p colectomy and end colostomy in 04/2021, colon cancer, anemia, chronic pain, and morbid obesity (BMI 61) who is followed by Dr. Nanas and presents today for follow-up of hypertension.   Patient was referred to Dr. Nanas in 10/2019 for further evaluation of tachycardia with episodes of what patient described as heart racing a couple times per day. She also reported some dyspnea with exertion at that time. Echo and ZIO monitor were ordered for further evaluation. Echo showed LVEF of 35% with mild LVH but no regional wall motion abnormalities and normal diastolic parameters. ZIO monitor showed rare PAC/PVCs but no significant rhythm.    She had multiple admissions in 2022 and 2023 for noncardiac issues. She was admitted in 04/2021 for septic shock secondary to chronic perperforated colon with retroperitoneal abscess and partial small bowel obstruction during one of these admissions. 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 Echo during that admission showed LVEF of 60-65% with no regional wall motion abnormalities and no significant valvular disease.   She was last seen by me in 09/2023 at which time she reported some lower extremity edema over the last 4 months as well as elevated BP but  was otherwise doing well form a cardiac standpoint. She was continued on PRN Lasix  and Valsartan  was increased for additional BP control. She was also started on Aspirin  and Lipitor for coronary artery calcifications. Home sleep study was also ordered.   Patient presents today for follow-up. ***  Hypertension BP well controlled. *** - Continue current medications: Amlodipine  10mg  daily, Valsartan  320mg  daily, and Coreg  18.75mg  twice daily.   Coronary Calcifications Prior CT scan in 10/2021 showed mild coronary calcifications. - No chest pain or anginal equivalent symptoms.  - Not currently on aspirin  or a statin.  - Continue Aspirin  81mg  daily and Lipitor 40mg  daily.    History of Palpitations/ Sinus Tachycardia Zio monitor in 10/2019 showed rare PACs/PVCs but no significant arrhythmias.  - Well controlled on higher dose of beta-blocker. No recent palpitations. *** - Continue Coreg  18.75mg  twice daily.     Hyperlipidemia Lipid panel in 11/2022: Total Cholesterol 205, Triglycerides 121, HDL 60, LDL 124. LDL goal <70 given coronary artery calcifications.  - She was started on Lipitor 40mg  daily in 09/2023. Continue.  - Will repeat lipid panel. ***   Chronic Lower Extremity Edema - Stable. *** - Continue Lasix  20mg  daily as needed for worsening edema.    Obstructive Sleep Apnea She has a history of OSA but has not used her CPAP machine in 2 years. [facility] home sleep study was ordered at last visit. ***   Morbid Obesity  BMI ***  EKGs/Labs/Other Studies Reviewed:    The following studies were reviewed:  Zio Monitor 10/23/2019 to 10/26/2019: No significant arrhythmias detected   3 days of data recorded on Zio monitor. Patient  had a min HR of 55 bpm, max HR of 132 bpm, and avg HR of 81 bpm. Predominant underlying rhythm was Sinus Rhythm. No VT, SVT, atrial fibrillation, high degree block, or pauses noted. Isolated atrial and ventricular ectopy was rare (<1%). There were 0 triggered events.  No significant arrhythmias detected. _______________   Echocardiogram 04/14/2021: Impressions:  1. Left ventricular ejection fraction, by estimation, is 60 to 65%. The  left ventricle has normal function. The left ventricle has no regional  wall motion abnormalities. Indeterminate diastolic filling due to E-A  fusion.   2. Right ventricular systolic function is normal. The right ventricular  size is normal.   3. The mitral valve is normal in structure. No evidence of mitral valve  regurgitation. No evidence of mitral stenosis.   4. The aortic valve is normal in structure. Aortic valve regurgitation is  not visualized. No aortic stenosis is present.   5. The inferior vena cava is normal in size with greater than 50%  respiratory variability, suggesting right atrial pressure of 3 mmHg.  EKG:  EKG not ordered today.   Recent Labs: 10/13/2023: TSH 1.080 11/24/2023: ALT 10; BUN 14; Creatinine 0.75; Hemoglobin 10.3; Platelet Count 412; Potassium 3.8; Sodium 137  Recent Lipid Panel    Component Value Date/Time   CHOL 205 (H) 11/30/2022 1726   TRIG 121 11/30/2022 1726   HDL 60 11/30/2022 1726   CHOLHDL 3.4 11/30/2022 1726   LDLCALC 124 (H) 11/30/2022 1726    Physical Exam:    Vital Signs: There were no vitals taken for this visit.    Wt Readings from Last 3 Encounters:  11/03/23 (!) 355 lb 12.8 oz (161.4 kg)  10/13/23 (!) 350 lb 8 oz (159 kg)  09/22/23 (!) 346 lb 9.6 oz (157.2 kg)     General: 38 y.o. female in no acute distress. HEENT: Normocephalic and atraumatic. Sclera clear.  Neck: Supple. No carotid bruits. No JVD. Heart: *** RRR. Distinct S1 and S2. No murmurs, gallops, or rubs.  Lungs: No increased work of breathing. Clear to ausculation bilaterally. No wheezes, rhonchi, or rales.  Abdomen: Soft, non-distended, and non-tender to palpation.  Extremities: No lower extremity edema.  Radial and distal pedal pulses 2+ and equal bilaterally. Skin: Warm and dry. Neuro: No  focal deficits. Psych: Normal affect. Responds appropriately.   Assessment:    No diagnosis found.  Plan:     Disposition: Follow up in ***   Signed, Aline FORBES Door, PA-C  12/06/2023 4:09 PM    [facility]

## 2023-12-12 ENCOUNTER — Telehealth: Payer: Self-pay | Admitting: Student

## 2023-12-12 NOTE — Telephone Encounter (Signed)
 Pt advised that I routed her lab order for a Lipid panel to her Oncology office to have with her other labs at her OV 12/13/23.

## 2023-12-12 NOTE — Telephone Encounter (Signed)
 Pt is requesting a callback regarding lab order and her wanting them sent to [facility] to have them done at the sam time she has chemo. Please advise

## 2023-12-13 ENCOUNTER — Ambulatory Visit: Admitting: Student

## 2023-12-13 ENCOUNTER — Other Ambulatory Visit: Payer: Self-pay

## 2023-12-14 NOTE — Assessment & Plan Note (Signed)
-  MMR loss of [name]6, [name] with high mutation burden, acquired BRCA mutation (+)  --Initially presented with diarrhea 11/04/20, found to have diverticulitis with perforation and also developed an abscess requiring 2 drains, multiple hospital stay  --she started FOLFOX on 06/22/21 but did not respond -she switched to Keytruda  on 09/08/21. She has been tolerating well overall but has developed joint pain which could be related, overall manageable. She is clinically doing better also with more energy and less abdominal pain   -her CEA has normalized on immunotherapy. -CT CAP on 03/15/22 again showed mixed response -she had PET scan on 04/26/2022 for evaluation of her ovarian cysts, and saw GYN Dr. Daisey Dryer, we decided to monitor it. -She tolerating Keytruda  well, with mild joint pain.  She recently started workout at the gym, which has helped her joint pain. -restaging CT from 08/04/2022 showed stable disease in left abdomen and pelvis, no new lesions.  I personally reviewed her scan images, and discussed findings with patient.   -Will continue Keytruda   -repeated CT scan on January 24, 2023 showed continuous response, no new lesions.  -PET on 05/04/2023 showed no definitive evidence of progression, some mildly hypermetabolic nodes, will continue monitoring, plan to stop Keytruda  in 09/2023 if no progression on next scan  -PET 08/29/2023 showed similar tiny left pericolic hypermetabolic nodules, most consistent with metastatic disease, no other definitive evidence of residual disease  -ctDNA GuardantReveal was negative on 10/13/2023

## 2023-12-15 ENCOUNTER — Inpatient Hospital Stay: Attending: Hematology

## 2023-12-15 ENCOUNTER — Inpatient Hospital Stay: Admitting: Hematology

## 2023-12-15 ENCOUNTER — Inpatient Hospital Stay

## 2023-12-15 ENCOUNTER — Other Ambulatory Visit: Payer: Self-pay

## 2023-12-15 VITALS — BP 120/78 | HR 80 | Temp 97.7°F | Resp 13 | Ht 63.0 in | Wt 356.7 lb

## 2023-12-15 DIAGNOSIS — I2510 Atherosclerotic heart disease of native coronary artery without angina pectoris: Secondary | ICD-10-CM | POA: Diagnosis not present

## 2023-12-15 DIAGNOSIS — D259 Leiomyoma of uterus, unspecified: Secondary | ICD-10-CM | POA: Diagnosis not present

## 2023-12-15 DIAGNOSIS — C786 Secondary malignant neoplasm of retroperitoneum and peritoneum: Secondary | ICD-10-CM | POA: Diagnosis not present

## 2023-12-15 DIAGNOSIS — D3502 Benign neoplasm of left adrenal gland: Secondary | ICD-10-CM | POA: Insufficient documentation

## 2023-12-15 DIAGNOSIS — I10 Essential (primary) hypertension: Secondary | ICD-10-CM | POA: Diagnosis not present

## 2023-12-15 DIAGNOSIS — R590 Localized enlarged lymph nodes: Secondary | ICD-10-CM | POA: Diagnosis not present

## 2023-12-15 DIAGNOSIS — K8020 Calculus of gallbladder without cholecystitis without obstruction: Secondary | ICD-10-CM | POA: Diagnosis not present

## 2023-12-15 DIAGNOSIS — Z7901 Long term (current) use of anticoagulants: Secondary | ICD-10-CM | POA: Diagnosis not present

## 2023-12-15 DIAGNOSIS — Z1501 Genetic susceptibility to malignant neoplasm of breast: Secondary | ICD-10-CM | POA: Insufficient documentation

## 2023-12-15 DIAGNOSIS — K5720 Diverticulitis of large intestine with perforation and abscess without bleeding: Secondary | ICD-10-CM | POA: Diagnosis not present

## 2023-12-15 DIAGNOSIS — Z933 Colostomy status: Secondary | ICD-10-CM | POA: Insufficient documentation

## 2023-12-15 DIAGNOSIS — Z791 Long term (current) use of non-steroidal anti-inflammatories (NSAID): Secondary | ICD-10-CM | POA: Insufficient documentation

## 2023-12-15 DIAGNOSIS — C7951 Secondary malignant neoplasm of bone: Secondary | ICD-10-CM | POA: Insufficient documentation

## 2023-12-15 DIAGNOSIS — R911 Solitary pulmonary nodule: Secondary | ICD-10-CM | POA: Insufficient documentation

## 2023-12-15 DIAGNOSIS — G4730 Sleep apnea, unspecified: Secondary | ICD-10-CM | POA: Diagnosis not present

## 2023-12-15 DIAGNOSIS — C187 Malignant neoplasm of sigmoid colon: Secondary | ICD-10-CM

## 2023-12-15 DIAGNOSIS — Z5112 Encounter for antineoplastic immunotherapy: Secondary | ICD-10-CM | POA: Insufficient documentation

## 2023-12-15 DIAGNOSIS — N83209 Unspecified ovarian cyst, unspecified side: Secondary | ICD-10-CM | POA: Insufficient documentation

## 2023-12-15 DIAGNOSIS — K760 Fatty (change of) liver, not elsewhere classified: Secondary | ICD-10-CM | POA: Insufficient documentation

## 2023-12-15 DIAGNOSIS — J90 Pleural effusion, not elsewhere classified: Secondary | ICD-10-CM | POA: Insufficient documentation

## 2023-12-15 DIAGNOSIS — G8929 Other chronic pain: Secondary | ICD-10-CM | POA: Diagnosis not present

## 2023-12-15 DIAGNOSIS — Z452 Encounter for adjustment and management of vascular access device: Secondary | ICD-10-CM

## 2023-12-15 DIAGNOSIS — K6819 Other retroperitoneal abscess: Secondary | ICD-10-CM | POA: Diagnosis not present

## 2023-12-15 DIAGNOSIS — M2550 Pain in unspecified joint: Secondary | ICD-10-CM | POA: Insufficient documentation

## 2023-12-15 DIAGNOSIS — Z79899 Other long term (current) drug therapy: Secondary | ICD-10-CM | POA: Insufficient documentation

## 2023-12-15 DIAGNOSIS — R222 Localized swelling, mass and lump, trunk: Secondary | ICD-10-CM | POA: Insufficient documentation

## 2023-12-15 DIAGNOSIS — Z1509 Genetic susceptibility to other malignant neoplasm: Secondary | ICD-10-CM | POA: Diagnosis not present

## 2023-12-15 DIAGNOSIS — R519 Headache, unspecified: Secondary | ICD-10-CM | POA: Insufficient documentation

## 2023-12-15 DIAGNOSIS — E785 Hyperlipidemia, unspecified: Secondary | ICD-10-CM

## 2023-12-15 DIAGNOSIS — Z7982 Long term (current) use of aspirin: Secondary | ICD-10-CM | POA: Insufficient documentation

## 2023-12-15 LAB — CBC WITH DIFFERENTIAL (CANCER CENTER ONLY)
Abs Immature Granulocytes: 0.02 K/uL (ref 0.00–0.07)
Basophils Absolute: 0 K/uL (ref 0.0–0.1)
Basophils Relative: 1 %
Eosinophils Absolute: 0.1 K/uL (ref 0.0–0.5)
Eosinophils Relative: 2 %
HCT: 31.6 % — ABNORMAL LOW (ref 36.0–46.0)
Hemoglobin: 10.2 g/dL — ABNORMAL LOW (ref 12.0–15.0)
Immature Granulocytes: 0 %
Lymphocytes Relative: 29 %
Lymphs Abs: 1.9 K/uL (ref 0.7–4.0)
MCH: 26.2 pg (ref 26.0–34.0)
MCHC: 32.3 g/dL (ref 30.0–36.0)
MCV: 81.2 fL (ref 80.0–100.0)
Monocytes Absolute: 0.4 K/uL (ref 0.1–1.0)
Monocytes Relative: 7 %
Neutro Abs: 4 K/uL (ref 1.7–7.7)
Neutrophils Relative %: 61 %
Platelet Count: 456 K/uL — ABNORMAL HIGH (ref 150–400)
RBC: 3.89 MIL/uL (ref 3.87–5.11)
RDW: 15.7 % — ABNORMAL HIGH (ref 11.5–15.5)
WBC Count: 6.5 K/uL (ref 4.0–10.5)
nRBC: 0 % (ref 0.0–0.2)

## 2023-12-15 LAB — CMP (CANCER CENTER ONLY)
ALT: 17 U/L (ref 0–44)
AST: 14 U/L — ABNORMAL LOW (ref 15–41)
Albumin: 3.9 g/dL (ref 3.5–5.0)
Alkaline Phosphatase: 46 U/L (ref 38–126)
Anion gap: 6 (ref 5–15)
BUN: 14 mg/dL (ref 6–20)
CO2: 25 mmol/L (ref 22–32)
Calcium: 8.8 mg/dL — ABNORMAL LOW (ref 8.9–10.3)
Chloride: 109 mmol/L (ref 98–111)
Creatinine: 0.76 mg/dL (ref 0.44–1.00)
GFR, Estimated: >60 mL/min (ref >60)
Glucose, Bld: 97 mg/dL (ref 70–99)
Potassium: 3.7 mmol/L (ref 3.5–5.1)
Sodium: 140 mmol/L (ref 135–145)
Total Bilirubin: 0.4 mg/dL (ref 0.0–1.2)
Total Protein: 7.3 g/dL (ref 6.5–8.1)

## 2023-12-15 LAB — LIPID PANEL
Cholesterol: 117 mg/dL (ref 0–200)
HDL: 46 mg/dL (ref >40)
LDL Cholesterol: 58 mg/dL (ref 0–99)
Total CHOL/HDL Ratio: 2.5 ratio
Triglycerides: 64 mg/dL (ref <150)
VLDL: 13 mg/dL (ref 0–40)

## 2023-12-15 LAB — TSH: TSH: 1.55 u[IU]/mL (ref 0.350–4.500)

## 2023-12-15 LAB — CEA (ACCESS): CEA (CHCC): <1 ng/mL (ref 0.00–5.00)

## 2023-12-15 MED ORDER — SODIUM CHLORIDE 0.9 % IV SOLN
200.0000 mg | Freq: Once | INTRAVENOUS | Status: AC
  Administered 2023-12-15: 200 mg via INTRAVENOUS
  Filled 2023-12-15: qty 200

## 2023-12-15 MED ORDER — SODIUM CHLORIDE 0.9 % IV SOLN: Freq: Once | INTRAVENOUS | Status: AC

## 2023-12-15 MED ORDER — SODIUM CHLORIDE 0.9% FLUSH
10.0000 mL | INTRAVENOUS | Status: DC | PRN
  Administered 2023-12-15: 10 mL

## 2023-12-15 MED ORDER — SODIUM CHLORIDE 0.9% FLUSH
10.0000 mL | Freq: Once | INTRAVENOUS | Status: AC
Start: 2023-12-15 — End: 2023-12-15
  Administered 2023-12-15: 10 mL

## 2023-12-15 MED ORDER — HEPARIN SOD (PORK) LOCK FLUSH 100 UNIT/ML IV SOLN
500.0000 [IU] | Freq: Once | INTRAVENOUS | Status: AC | PRN
  Administered 2023-12-15: 500 [IU]

## 2023-12-15 NOTE — Progress Notes (Signed)
 [facility]   Telephone:(336) [phone] Fax:(336) [phone]   Clinic Follow up Note   Patient Care Team: Jarold Medici, MD as PCP - General (Internal Medicine) [name], MD as PCP - Cardiology (Cardiology) [name], [name] as Physician Assistant (Emergency Medicine) Lanny Callander, MD as Consulting Physician (Oncology) Sheldon Standing, MD as Consulting Physician (General Surgery) [name], Dedra, MD as Consulting Physician (Neurology) [name], Krystal, MD as Consulting Physician (Obstetrics and Gynecology) [name], MD as Consulting Physician (Gastroenterology) [name], [name], NP as Nurse Practitioner (Nurse Practitioner)  Date of Service:  12/15/2023  CHIEF COMPLAINT: f/u of metastatic colon cancer  CURRENT THERAPY:  Keytruda  every 3 weeks  Oncology History   Malignant neoplasm of sigmoid colon [facility]) -MMR loss of [name]6, [name] with high mutation burden, acquired BRCA mutation (+)  --Initially presented with diarrhea 11/04/20, found to have diverticulitis with perforation and also developed an abscess requiring 2 drains, multiple hospital stay  --she started FOLFOX on 06/22/21 but did not respond -she switched to Keytruda  on 09/08/21. She has been tolerating well overall but has developed joint pain which could be related, overall manageable. She is clinically doing better also with more energy and less abdominal pain   -her CEA has normalized on immunotherapy. -CT CAP on 03/15/22 again showed mixed response -she had PET scan on 04/26/2022 for evaluation of her ovarian cysts, and saw GYN Dr. [name], we decided to monitor it. -She tolerating Keytruda  well, with mild joint pain.  She recently started workout at the gym, which has helped her joint pain. -restaging CT from 08/04/2022 showed stable disease in left abdomen and pelvis, no new lesions.  I personally reviewed her scan images, and discussed findings with patient.   -Will  continue Keytruda   -repeated CT scan on January 24, 2023 showed continuous response, no new lesions.  -PET on 05/04/2023 showed no definitive evidence of progression, some mildly hypermetabolic nodes, will continue monitoring, plan to stop Keytruda  in 09/2023 if no progression on next scan  -PET 08/29/2023 showed similar tiny left pericolic hypermetabolic nodules, most consistent with metastatic disease, no other definitive evidence of residual disease  -ctDNA [facility] was negative on 10/13/2023  Assessment & Plan Colon cancer -She is overall doing well clinically, tolerating Keytruda  well except arthralgia.   -No new issues post-infusion. Plan to repeat [facility] review next month. If negative, may discontinue treatment after the next PET scan. - Order PET scan in September - Repeat [facility] review next month  Chronic pain Chronic pain with cramping in the lower left abdomen, legs, and back. Pain is less frequent but current medication causes excessive drowsiness. Not using Bentyl  due to timing issues. Engaging in weight management and physical activity for back pain. - Refer to physical therapy for back pain management - Advise on use of ibuprofen  for pain management - Assist with obtaining a bath chair through [facility]  Headaches Experiencing daily headaches with pain ranging from 5 to 9 out of 10. Occasional blurry vision resolves with blinking. No double vision or hearing problems. Headaches may be related to medication use. - Consider imaging if headaches worsen  Hypertension Blood pressure is well-controlled with current medication.  Vitamin B12 supplementation Discussed potential use of B12 shots for energy and appetite suppression. Will check B12 levels to determine necessity. - Check B12 levels  Plan - Lab reviewed, adequate for treatment, will proceed with Keytruda  today and continue every 3 weeks - Follow-up in 6 weeks - Plan to repeat a  [facility] review next  months, and PET scan in September   SUMMARY OF ONCOLOGIC HISTORY: Oncology History Overview Note   Cancer Staging  Malignant neoplasm of sigmoid colon [facility]) Staging form: Colon and Rectum, AJCC 8th Edition - Pathologic stage from 04/15/2021: Stage IIC (pT4b, pN0, cM0) - Signed by Lanny Callander, MD on 06/12/2021    Malignant neoplasm of sigmoid colon [facility])   Initial Diagnosis   Malignant neoplasm of sigmoid colon ([facility])   11/04/2020 Imaging   EXAM: CT ABDOMEN AND PELVIS WITHOUT CONTRAST  IMPRESSION: 1. Perforating descending colonic diverticulitis with multiple abdominopelvic gas and fluid collections, measuring up to 5.7 cm and further described above. 2. Cholelithiasis without findings of acute cholecystitis. 3. 3.6 cm benign left adrenal adenoma. 4. Leiomyomatous uterus. 5. 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   11/07/2020 Imaging   EXAM: CT ABDOMEN AND PELVIS WITHOUT CONTRAST  IMPRESSION: Continued wall thickening is seen involving descending colon suggesting infectious or inflammatory colitis or perforated diverticulitis. There is an adjacent fluid collection measuring 7.0 x 5.0 cm consistent with abscess which is significantly enlarged compared to prior exam. Adjacent to the abscess, there is a severely thickened small bowel loop most consistent with secondary inflammation.   5.2 x 3.5 cm fluid collection is noted within the left psoas muscle consistent with abscess which is significantly enlarged compared to prior exam. 4.4 x 3.8 cm fluid collection consistent with abscess is noted in the left retroperitoneal region which is also enlarged compared to prior exam.   Fibroid uterus.   Cholelithiasis.   11/27/2020 Imaging   EXAM: CT ABDOMEN AND PELVIS WITHOUT CONTRAST  IMPRESSION: 1. Significantly interval decreased size of the previously visualized left retroperitoneal and  left anterior mid abdominal fluid collections. There is persistent fat stranding and mural thickening of the mid descending colon at this level. 2. Unchanged leiomyomatous uterus. 3. Unchanged cholelithiasis.   12/11/2020 Imaging   EXAM: CT ABDOMEN AND PELVIS WITHOUT CONTRAST  IMPRESSION: 1. Stable inflammatory changes centered around the distal descending colon in the left lower abdomen. Pericolonic inflammatory changes have minimally changed since 11/27/2020. Small pocket of gas medial to the colon is probably associated with a fistula or small residual abscess collection. 2. Stable position of the two percutaneous drains. The more anterior drain may be extending through a portion of the small bowel. 3. Cholelithiasis. 4. Fibroid uterus. Cannot exclude an ovarian/adnexal cystic structure near the uterine fundus. 5. Left adrenal adenoma.   01/07/2021 Imaging   EXAM: CT ABDOMEN AND PELVIS WITH CONTRAST  IMPRESSION: 1. No new abscesses identified. Similar degree of soft tissue thickening seen in the left pericolonic region. The degree of persistent soft tissues thickening in the pericolonic space further raises suspicions for malignancy. Further evaluation with colonoscopy should be performed. 2. Left retroperitoneal abscess drain unchanged in position. Anterior left abdominal drain again seen terminating within small bowel loop. 3. 2.6 cm mildly sclerotic lesion noted in the L2 vertebral body. Further evaluation with contrast enhanced lumbar spine MRI should be performed.   02/04/2021 Imaging   EXAM: CT ABDOMEN AND PELVIS WITH CONTRAST  IMPRESSION: No new abdominopelvic collections or abscess development in the 1 month interval.   Left anterior drain remains within a loop of small bowel, unchanged.   Left lateral abscess drain remains in the retroperitoneal space adjacent to the iliopsoas muscle with a small amount of residual fluid and air but no measurable  collection.    Stable soft tissue prominence and pericolonic strandy edema/inflammation about the left descending colon compatible with residual diverticulitis/colitis. Difficult to exclude underlying transmural lesion.   04/01/2021 Imaging   EXAM: CT ABDOMEN AND PELVIS WITH CONTRAST  IMPRESSION: There appears to be significant enhancement and wall thickening involving the descending colon with some degree of traction and involvement of adjacent small bowel loops. This is consistent with the history of diverticulitis and perforation. Stable position of percutaneous drainage catheter is seen adjacent to left psoas muscle with no significant residual fluid remaining. The other percutaneous drainage catheter that was previously noted to be within small bowel loop on prior exam in the left lower quadrant, has significantly retracted and appears to be outside of the peritoneal space at this time.   Since the prior exam, there does appear to be some degree of rotation involving mesenteric vessels and structures in the right lower quadrant, suggesting partial volvulus or malrotation. There is the interval development of several lymph nodes in this area, most likely inflammatory or reactive in etiology. Mild amount of free fluid is also noted in the pelvis. However, no significant bowel wall thickening or dilatation is seen in this area. These results will be called to the ordering clinician or representative by the Radiologist Assistant, and communication documented in the PACS or zVision Dashboard.   Stable uterine fibroid.   Hepatic steatosis.   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   Cholelithiasis.   04/10/2021 Imaging   EXAM: CT ANGIOGRAPHY CHEST CT ABDOMEN AND PELVIS WITH CONTRAST  IMPRESSION: 1. Again seen are findings compatible with descending colon diverticulitis with perforation. Free air and inflammation have increased in the interval. 2. New lobulated enhancing fluid collection  posterior to this segment of inflamed colon measuring 8.5 x 3.5 x 10.0 cm. This collection now invades the adjacent iliopsoas muscle as well as extends through the left lateral abdominal wall. The tip of the drainage catheter is in this collection. Findings are compatible with abscess. 3. Anterior left percutaneous drainage catheter tip has been pulled back and is now within the subcutaneous tissues. 4. Trace free fluid. 5. No pulmonary embolism.  No acute cardiopulmonary process. 6. Cholelithiasis. 7. Fatty infiltration of the liver.   04/15/2021 Definitive Surgery   FINAL MICROSCOPIC DIAGNOSIS:   A. SMALL BOWEL, RESECTION:  - Adenocarcinoma.  - No carcinoma identified in 1 lymph node.   B. PERFORATED LEFT COLON, RESECTION:  - Moderately differentiated colonic adenocarcinoma.  - Tumor extends into pericolonic adipose tissue, and is strongly  suggestive of invasion into small bowel.  See oncology table/comments.  - No carcinoma identified in 4 lymph nodes (0/4).  - Tubular adenoma with high-grade dysplasia, 1.  - Tubular adenomas with low grade dysplasia, 3.   Comments: The size of the tumor is difficult to estimate secondary to the disrupted nature of the specimen, as well as the infiltrative nature of the tumor.  Tumor can be identified as definitely invading into the pericolonic adipose tissue (block B4), but given the extreme disruption of the tissue, and the involvement of the small bowel by what appears to be a colonic adenocarcinoma, I favor perforation of the large bowel with direct invasion into the small bowel.  Accordingly, I believe this is best regarded as a pT4b lesion.   ADDENDUM:  Mismatch Repair Protein (IHC)  SUMMARY INTERPRETATION: ABNORMAL  There is loss of the major MMR protein [name]6: This indicates a high probability that a hereditary germline mutation is present  and referral to genetic counseling is warranted. It is recommended that the loss of protein expression  be correlated with molecular based microsatellite instability testing.   IHC EXPRESSION RESULTS  TEST           RESULT  MLH1:          Preserved nuclear expression  [name]2:          Preserved nuclear expression  [name]6:          LOSS OF NUCLEAR EXPRESSION  PMS2:          Preserved nuclear expression    04/15/2021 Cancer Staging   Staging form: Colon and Rectum, AJCC 8th Edition - Pathologic stage from 04/15/2021: Stage IIC (pT4b, pN0, cM0) - Signed by Lanny Callander, MD on 06/12/2021 Stage prefix: Initial diagnosis Total positive nodes: 0 Histologic grading system: 4 grade system Histologic grade (G): G2 Residual tumor (R): R0 - None   04/29/2021 Imaging   EXAM: CT ABDOMEN AND PELVIS WITH CONTRAST  IMPRESSION: Continued left retroperitoneal abscesses inferior to the left kidney, involving the left psoas muscle and left abdominal wall musculature. Overall size is decreased since prior study. Interval removal of left lower quadrant abscess drainage catheter.   New fluid collection in the cul-de-sac and wrapping around the uterus concerning for abscess.   Cholelithiasis.   Small left pleural effusion.  Bibasilar atelectasis.   Stable left adrenal adenoma.  ADDENDUM: After discussing the case with Dr. Katha in interventional radiology, it was noted that the fluid in the pelvis originally thought to be in the cul-de-sac is likely within the vagina, best seen on sagittal imaging. Recommend speculum exam.   Also, in the left lateral wall in the area of prior abscess in the lateral wall abdominal musculature, some of the abdomen soft tissue appears to be enhancing. While this could be infectious, cannot exclude tumor seeding in the left lateral abdominal wall, measuring 6.9 x 3.8 cm on image 64 of series 2.   06/16/2021 Imaging   EXAM: CT ABDOMEN AND PELVIS WITH CONTRAST  IMPRESSION: Increased size of bulky soft tissue masses involving the left psoas muscle and left lateral abdominal wall  soft tissues, consistent with progressive metastatic disease.   Significant progression of multiple peritoneal masses throughout the abdomen and pelvis, consistent with peritoneal carcinomatosis.   New mild retroperitoneal lymphadenopathy, consistent with metastatic disease.   Stable uterine fibroid and left adrenal adenoma.   Cholelithiasis, without evidence of cholecystitis.   06/22/2021 - 08/19/2021 Chemotherapy   Patient is on Treatment Plan : COLORECTAL FOLFOX q14d x 6 months     06/22/2021 Tumor Marker   Patient's tumor was tested for the following markers: CEA. Results of the tumor marker test revealed 87.41.   07/10/2021 Pathology Results   FINAL MICROSCOPIC DIAGNOSIS:   A. PERITONEAL MASS, LEFT UPPER ABDOMINAL QUADRANT, BIOPSY:  -  Metastatic adenocarcinoma with necrosis, histologically consistent with colon primary.     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 The report date is 07/16/2021. The remainder of testing was negative/normal.  The CancerNext-Expanded + RNAinsight gene panel offered by [employer] and includes sequencing and rearrangement analysis for the following 77 genes: IP, ALK, APC*, ATM*, AXIN2, BAP1, BARD1, BLM, BMPR1A, BRCA1*, BRCA2*, BRIP1*, CDC73, CDH1*,CDK4, CDKN1B, CDKN2A, CHEK2*, CTNNA1, DICER1, FANCC, FH, FLCN, GALNT12, KIF1B, LZTR1, MAX, MEN1, MET, MLH1*, [name]2*, [name]3, [name]6*, MUTYH*, NBN, NF1*, NF2, NTHL1, PALB2*, PHOX2B, PMS2*, POT1, PRKAR1A, PTCH1, PTEN*, RAD51C*, RAD51D*,RB1, RECQL,  RET, SDHA, SDHAF2, SDHB, SDHC, SDHD, SMAD4, SMARCA4, SMARCB1, SMARCE1, STK11, SUFU, TMEM127, TP53*,TSC1, TSC2, VHL and XRCC2 (sequencing and deletion/duplication); EGFR, EGLN1, HOXB13, KIT, MITF, PDGFRA, POLD1 and POLE (sequencing only); EPCAM and GREM1 (deletion/duplication only).   08/28/2021 Imaging   EXAM: CT CHEST, ABDOMEN, AND PELVIS WITH CONTRAST  IMPRESSION: 1. Continued interval  progression of multiple metastatic soft tissue nodules/masses in the abdomen and pelvis. 2. No evidence for metastatic disease in the chest. 3. Similar appearance of the subtle lesion in the L2 vertebral body, suspicious for metastatic involvement. 4. Cholelithiasis.   09/03/2021 -  Chemotherapy   Patient is on Treatment Plan : COLORECTAL Pembrolizumab  (200) q21d     09/08/2021 - 02/01/2022 Chemotherapy   Patient is on Treatment Plan : COLORECTAL Pembrolizumab  (200) q21d     01/08/2022 Imaging   CT CAP IMPRESSION: 1. Mixed response to therapy. Interval decrease in size of the conglomerate multilobulated mass extending from the left psoas into the left posterior pararenal space through the left lateral abdominal wall along the tract of the previously placed percutaneous drainage catheter. Interval decrease in size of right adnexal mass and omental masses within the right lower quadrant of the abdomen. Interval development of peritoneal carcinomatosis with omental caking within the left lower quadrant of the abdomen and left subdiaphragmatic region adjacent to the spleen and along the left pericolic gutter as well as development of ascites appearing loculated within the anterior peritoneum. 2. Interval development of mild left hydronephrosis secondary to stricturing of the mid left ureter. 3. Stable left adrenal metastasis. 4. Stable sclerotic lesion within the L2 vertebral body. No new lytic or blastic bone lesions are seen. 5. Cholelithiasis. 6. No evidence of intrathoracic metastatic disease. 7. 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 Moderate colonic stool burden. No evidence of obstruction.   03/15/2022 Imaging   IMPRESSION: 1. Status post sigmoid colon resection with left lower quadrant end colostomy. 2. Multicystic bilateral ovarian lesions are slightly increased in size. 3. Diminished, small volume of loculated  appearing ascites throughout the abdomen and pelvis, with similar appearance of peritoneal thickening and nodularity throughout. 4. Heterogeneously calcified, conglomerate masses in the left retroperitoneum and abdominal wall are slightly diminished in size. 5. Left adrenal lesion not significantly changed. 6. Unchanged faintly sclerotic metastatic lesion of L2. 7. Constellation of findings is consistent with mixed response to treatment however with overall little interval change. 8. Minimal residual left hydronephrosis and proximal hydroureter, improved compared to prior examination. 9. Cholelithiasis. 10. Coronary artery disease, significantly advanced for patient age.   08/04/2022 Imaging    IMPRESSION: CT CHEST:   1. No developing mass lesion, fluid collection or lymph node enlargement in the thorax. Stable tiny 3 mm lung nodule. 2. Coronary artery calcifications. Please correlate for other coronary risk factors. 3. Chest port   CT ABDOMEN AND PELVIS:   1. Extensive surgical changes identified. Left lower quadrant ostomy, diverting colostomy. Surgical changes along loops of small bowel in the right hemiabdomen. 2. Once again there are areas of nodular tissue extending along the left lateral abdominal wall, into the retroperitoneum which are similar to previous. The amount of adjacent fluid in these locations has improved. 3. No new mass lesion, fluid collection or lymph node enlargement in the abdomen or pelvis. 4. Gallstones. 5. Enlarged uterus with fibroids. Bilateral complex cystic areas are once again seen and based on appearance differential includes dilated fallopian tubes, hydrosalpinx versus true cystic lesions. Please  correlate for any known history or prior workup 6. Limited evaluation for solid organ pathology metastatic disease without the advantage of IV contrast     05/04/2023 PET scan   PET - restaging - skull base to thigh  IMPRESSION: 1. Subcentimeter  peritoneal nodules in the left abdomen have decreased in size and hypermetabolism from 04/26/2022, compatible with treated metastatic disease. 2. Chronic focal hypermetabolism associated with a gallbladder nodule. Malignancy cannot be excluded. 3. Small hypermetabolic to borderline hypermetabolic external iliac and left inguinal lymph nodes and minimally hypermetabolic small bilateral cervical lymph nodes, nonspecific. Metastatic disease not excluded. Recommend attention on follow-up. 4. Left adrenal adenoma. 5. Cystic right adnexal mass is inadequately characterized. Because this lesion is not adequately characterized, prompt US  is recommended for further evaluation. Note: This recommendation does not apply to premenarchal patients and to those with increased risk (genetic, family history, elevated tumor markers or other high-risk factors) of ovarian cancer. Reference: JACR 2020 Feb; 17(2):248-254 6. 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 No developing mass lesion, fluid collection or lymph node enlargement in the thorax. Stable tiny 3 mm lung nodule. 2. Coronary artery calcifications. Please correlate for other coronary risk factors. 3. Chest port   CT ABDOMEN AND PELVIS:   1. Extensive surgical changes identified. Left lower quadrant ostomy, diverting colostomy. Surgical changes along loops of small bowel in the right hemiabdomen. 2. Once again there are areas of nodular tissue extending along the left lateral abdominal wall, into the retroperitoneum which are similar to previous. The amount of adjacent fluid in these locations has improved. 3. No new mass lesion, fluid collection or  lymph node enlargement in the abdomen or pelvis. 4. Gallstones. 5. Enlarged uterus with fibroids. Bilateral complex cystic areas are once again seen and based on appearance differential includes dilated fallopian tubes, hydrosalpinx versus true cystic lesions. Please correlate for any known history or prior workup 6. Limited evaluation for solid organ pathology metastatic disease without the advantage of IV contrast        Discussed the use of AI scribe software for clinical note transcription with the patient, who gave verbal consent to proceed.  History of Present Illness [facility] is a 38 year old female with colon cancer who presents for follow-up.  She experiences constant cramping in the lower abdomen, particularly on the left side, as well as in her legs and back. Various medications have reduced the frequency of cramps but caused excessive drowsiness. She has not tried Bentyl  for the cramps.  She has daily headaches, rated between 5 to 9 on a pain scale, with one currently present. Occasionally, she experiences blurry vision that improves after blinking. No double vision or hearing problems are present.  Her sleep is disrupted at night, with better sleep during the day. Blood pressure is controlled with new medication, though it was very low on one occasion.  She is considering vitamin B12 shots for energy and appetite suppression but is unsure if it is compatible with her current treatment. Her B12 levels were normal in 2022, and she is awaiting a recheck.     All other systems were reviewed with the patient and are  negative.  MEDICAL HISTORY:  Past Medical History:  Diagnosis Date   Allergy    seasonal   Anemia    Blood infection 1985   Blood transfusion without reported diagnosis    colon ca 04/2021   Diverticulitis    Family history of breast cancer    Family history of colon cancer    Family history of colon cancer    Family history of stomach cancer     Hypertension    Obesity    Sleep apnea     SURGICAL HISTORY: Past Surgical History:  Procedure Laterality Date   COLECTOMY WITH COLOSTOMY CREATION/HARTMANN PROCEDURE N/A 04/15/2021   Procedure: COLOSTOMY CREATION/HARTMANN PROCEDURE;  Surgeon: Sheldon Standing, MD;  Location: [facility];  Service: General;  Laterality: N/A;   IR CATHETER TUBE CHANGE  12/12/2020   IR CATHETER TUBE CHANGE  01/27/2021   IR CATHETER TUBE CHANGE  02/19/2021   IR CATHETER TUBE CHANGE  04/13/2021   IR IMAGING GUIDED PORT INSERTION  10/02/2021   IR RADIOLOGIST EVAL & MGMT  11/27/2020   IR RADIOLOGIST EVAL & MGMT  12/11/2020   IR RADIOLOGIST EVAL & MGMT  01/07/2021   IR RADIOLOGIST EVAL & MGMT  02/04/2021   IR SINUS/FIST TUBE CHK-NON GI  12/12/2020   IR SINUS/FIST TUBE CHK-NON GI  02/19/2021   LAPAROSCOPIC PARTIAL COLECTOMY N/A 04/15/2021   Procedure: LAPAROSCOPIC ASSISTED HARTMANN RESECTION;  Surgeon: Sheldon Standing, MD;  Location: [facility];  Service: General;  Laterality: N/A;    I have reviewed the social history and family history with the patient and they are unchanged from previous note.  ALLERGIES:  is allergic to chlorhexidine  gluconate, shellfish allergy, and penicillins.  MEDICATIONS:  Current Outpatient Medications  Medication Sig Dispense Refill   acetaminophen -codeine  (TYLENOL  #3) 300-30 MG tablet Take 1 tablet by mouth every 8 (eight) hours as needed for moderate pain (pain score 4-6). 30 tablet 0   ALPRAZolam  (XANAX ) 0.25 MG tablet Take 1 tablet (0.25 mg total) by mouth at bedtime as needed for anxiety. 30 tablet 0   amLODipine  (NORVASC ) 10 MG tablet TAKE 1 TABLET BY MOUTH EVERY DAY 90 tablet 3   aspirin  EC 81 MG tablet Take 1 tablet (81 mg total) by mouth daily. Swallow whole.     atorvastatin  (LIPITOR) 40 MG tablet Take 1 tablet (40 mg total) by mouth daily. 30 tablet 6   carvedilol  (COREG ) 12.5 MG tablet TAKE 1.5 TABLETS BY MOUTH 2 (TWO) TIMES DAILY. 270 tablet 2   diclofenac  Sodium (VOLTAREN ) 1 % GEL  Apply 2 g topically 4 (four) times daily. 100 g 2   dicyclomine  (BENTYL ) 10 MG capsule Take 1 capsule (10 mg total) by mouth every 8 (eight) hours as needed for spasms. 20 capsule 0   furosemide  (LASIX ) 20 MG tablet TAKE 1 TABLET BY MOUTH EVERY DAY AS NEEDED 90 tablet 1   gabapentin  (NEURONTIN ) 300 MG capsule Take 1 capsule (300 mg total) by mouth at bedtime. 90 capsule 1   KLOR-CON  M20 20 MEQ tablet TAKE 1 TABLET BY MOUTH EVERY DAY 90 tablet 1   lidocaine -prilocaine  (EMLA ) cream Apply 1 Application topically as needed. 30 g 3   methocarbamol  (ROBAXIN -750) 750 MG tablet Take 1 tablet (750 mg total) by mouth every 8 (eight) hours as needed for muscle spasms. 60 tablet 0   prochlorperazine  (COMPAZINE ) 10 MG tablet Take 1 tablet (10 mg total) by mouth every 8 (eight) hours as needed for nausea or vomiting. 30 tablet 0  traZODone  (DESYREL ) 50 MG tablet TAKE 1-2 TABLETS BY MOUTH AT BEDTIME AS NEEDED FOR SLEEP. 180 tablet 1   triamcinolone  cream (KENALOG ) 0.1 % APPLY TO AFFECTED AREA TWICE DAILY AS NEEDED 30 g 0   valsartan  (DIOVAN ) 320 MG tablet Take 1 tablet (320 mg total) by mouth daily. 30 tablet 6   No current facility-administered medications for this visit.   Facility-Administered Medications Ordered in Other Visits  Medication Dose Route Frequency Provider Last Rate Last Admin   fentaNYL  (SUBLIMAZE ) injection   Intravenous PRN Mir, Farhaan R, MD   50 mcg at 10/02/21 1436   midazolam  (VERSED ) injection   Intravenous PRN Mir, Farhaan R, MD   1 mg at 10/02/21 1436   sodium chloride  flush (NS) 0.9 % injection 10 mL  10 mL Intracatheter PRN Lanny Callander, MD   10 mL at 12/15/23 1612    PHYSICAL EXAMINATION: ECOG PERFORMANCE STATUS: 1 - Symptomatic but completely ambulatory  Vitals:   12/15/23 1412 12/15/23 1415  BP: (!) 141/89 120/78  Pulse: 84 80  Resp: 13   Temp: 97.7 F (36.5 C)   SpO2: 96% 100%   Wt Readings from Last 3 Encounters:  12/15/23 (!) 161.8 kg (356 lb 11.2 oz)  11/03/23  (!) 161.4 kg (355 lb 12.8 oz)  10/13/23 (!) 159 kg (350 lb 8 oz)    GENERAL:alert, no distress and comfortable SKIN: skin color, texture, turgor are normal, no rashes or significant lesions EYES: normal, Conjunctiva are pink and non-injected, sclera clear NECK: supple, thyroid  normal size, non-tender, without nodularity LYMPH:  no palpable lymphadenopathy in the cervical, axillary  LUNGS: clear to auscultation and percussion with normal breathing effort HEART: regular rate & rhythm and no murmurs and no lower extremity edema ABDOMEN:abdomen soft, non-tender and normal bowel sounds Musculoskeletal:no cyanosis of digits and no clubbing  NEURO: alert & oriented x 3 with fluent speech, no focal motor/sensory deficits  Physical Exam    LABORATORY DATA:  I have reviewed the data as listed    Latest Ref Rng & Units 12/15/2023    1:52 PM 11/24/2023    1:25 PM 11/03/2023    1:35 PM  CBC  WBC 4.0 - 10.5 K/uL 6.5  5.8  6.4   Hemoglobin 12.0 - 15.0 g/dL 89.7  89.6  89.7   Hematocrit 36.0 - 46.0 % 31.6  31.7  31.6   Platelets 150 - 400 K/uL 456  412  414         Latest Ref Rng & Units 12/15/2023    1:52 PM 11/24/2023    1:25 PM 11/03/2023    1:35 PM  CMP  Glucose 70 - 99 mg/dL 97  94  99   BUN 6 - 20 mg/dL 14  14  18    Creatinine 0.44 - 1.00 mg/dL 9.23  9.24  9.25   Sodium 135 - 145 mmol/L 140  137  139   Potassium 3.5 - 5.1 mmol/L 3.7  3.8  4.0   Chloride 98 - 111 mmol/L 109  109  109   CO2 22 - 32 mmol/L 25  21  23    Calcium  8.9 - 10.3 mg/dL 8.8  8.6  8.9   Total Protein 6.5 - 8.1 g/dL 7.3  7.2  7.4   Total Bilirubin 0.0 - 1.2 mg/dL 0.4  0.4  0.3   Alkaline Phos 38 - 126 U/L 46  46  54   AST 15 - 41 U/L 14  10  13  ALT 0 - 44 U/L 17  10  15        RADIOGRAPHIC STUDIES: I have personally reviewed the radiological images as listed and agreed with the findings in the report. No results found.    Orders Placed This Encounter  Procedures   Vitamin B12    Standing Status:    Future    Expected Date:   12/29/2023    Expiration Date:   12/14/2024   CBC with Differential ([facility] Only)    Standing Status:   Future    Expected Date:   02/16/2024    Expiration Date:   02/15/2025   CMP ([facility] only)    Standing Status:   Future    Expected Date:   02/16/2024    Expiration Date:   02/15/2025   CBC with Differential ([facility] Only)    Standing Status:   Future    Expected Date:   03/08/2024    Expiration Date:   03/08/2025   CMP ([facility] only)    Standing Status:   Future    Expected Date:   03/08/2024    Expiration Date:   03/08/2025   Ambulatory referral to Physical Therapy    Referral Priority:   Routine    Referral Type:   Physical Medicine    Referral Reason:   Specialty Services Required    Requested Specialty:   Physical Therapy    Number of Visits Requested:   1   All questions were answered. The patient knows to call the clinic with any problems, questions or concerns. No barriers to learning was detected. The total time spent in the appointment was 25 minutes, including review of chart and various tests results, discussions about plan of care and coordination of care plan     [name], MD 12/15/2023

## 2023-12-15 NOTE — Patient Instructions (Signed)
 [facility] - A DEPT OF [facility]  Discharge Instructions: Thank you for choosing [facility] to provide your oncology and hematology care.   If you have a lab appointment with the Cancer Center, please go directly to the Cancer Center and check in at the registration area.   Wear comfortable clothing and clothing appropriate for easy access to any Portacath or PICC line.   We strive to give you quality time with your provider. You may need to reschedule your appointment if you arrive late (15 or more minutes).  Arriving late affects you and other patients whose appointments are after yours.  Also, if you miss three or more appointments without notifying the office, you may be dismissed from the clinic at the provider's discretion.      For prescription refill requests, have your pharmacy contact our office and allow 72 hours for refills to be completed.    Today you received the following chemotherapy and/or immunotherapy agents Rande Lawman      To help prevent nausea and vomiting after your treatment, we encourage you to take your nausea medication as directed.  BELOW ARE SYMPTOMS THAT SHOULD BE REPORTED IMMEDIATELY: *FEVER GREATER THAN 100.4 F (38 C) OR HIGHER *CHILLS OR SWEATING *NAUSEA AND VOMITING THAT IS NOT CONTROLLED WITH YOUR NAUSEA MEDICATION *UNUSUAL SHORTNESS OF BREATH *UNUSUAL BRUISING OR BLEEDING *URINARY PROBLEMS (pain or burning when urinating, or frequent urination) *BOWEL PROBLEMS (unusual diarrhea, constipation, pain near the anus) TENDERNESS IN MOUTH AND THROAT WITH OR WITHOUT PRESENCE OF ULCERS (sore throat, sores in mouth, or a toothache) UNUSUAL RASH, SWELLING OR PAIN  UNUSUAL VAGINAL DISCHARGE OR ITCHING   Items with * indicate a potential emergency and should be followed up as soon as possible or go to the Emergency Department if any problems should occur.  Please show the CHEMOTHERAPY ALERT CARD or IMMUNOTHERAPY  ALERT CARD at check-in to the Emergency Department and triage nurse.  Should you have questions after your visit or need to cancel or reschedule your appointment, please contact [facility] - A DEPT [facility]  Dept: [phone]  and follow the prompts.  Office hours are 8:00 a.m. to 4:30 p.m. Monday - Friday. Please note that voicemails left after 4:00 p.m. may not be returned until the following business day.  We are closed weekends and major holidays. You have access to a nurse at all times for urgent questions. Please call the main number to the clinic Dept: [phone] and follow the prompts.   For any non-urgent questions, you may also contact your provider using MyChart. We now offer e-Visits for anyone 85 and older to request care online for non-urgent symptoms. For details visit [email or web address].   Also download the MyChart app! Go to the app store, search "MyChart", open the app, select , and log in with your MyChart username and password.

## 2023-12-16 LAB — T4: T4, Total: 8.6 ug/dL (ref 4.5–12.0)

## 2023-12-17 DIAGNOSIS — Z933 Colostomy status: Secondary | ICD-10-CM | POA: Diagnosis not present

## 2023-12-26 ENCOUNTER — Ambulatory Visit: Admitting: Physical Therapy

## 2023-12-26 NOTE — Therapy (Incomplete)
 OUTPATIENT PHYSICAL THERAPY THORACOLUMBAR EVALUATION   Patient Name: Mikayla Rios MRN: [id number] DOB:1986-05-21, 38 y.o., female Today's Date: 12/26/2023  END OF SESSION:   Past Medical History:  Diagnosis Date   Allergy    seasonal   Anemia    Blood infection 1985   Blood transfusion without reported diagnosis    colon ca 04/2021   Diverticulitis    Family history of breast cancer    Family history of colon cancer    Family history of colon cancer    Family history of stomach cancer    Hypertension    Obesity    Sleep apnea    Past Surgical History:  Procedure Laterality Date   COLECTOMY WITH COLOSTOMY CREATION/HARTMANN PROCEDURE N/A 04/15/2021   Procedure: COLOSTOMY CREATION/HARTMANN PROCEDURE;  Surgeon: Sheldon Standing, MD;  Location: [facility];  Service: General;  Laterality: N/A;   IR CATHETER TUBE CHANGE  12/12/2020   IR CATHETER TUBE CHANGE  01/27/2021   IR CATHETER TUBE CHANGE  02/19/2021   IR CATHETER TUBE CHANGE  04/13/2021   IR IMAGING GUIDED PORT INSERTION  10/02/2021   IR RADIOLOGIST EVAL & MGMT  11/27/2020   IR RADIOLOGIST EVAL & MGMT  12/11/2020   IR RADIOLOGIST EVAL & MGMT  01/07/2021   IR RADIOLOGIST EVAL & MGMT  02/04/2021   IR SINUS/FIST TUBE CHK-NON GI  12/12/2020   IR SINUS/FIST TUBE CHK-NON GI  02/19/2021   LAPAROSCOPIC PARTIAL COLECTOMY N/A 04/15/2021   Procedure: LAPAROSCOPIC ASSISTED HARTMANN RESECTION;  Surgeon: Sheldon Standing, MD;  Location: [facility];  Service: General;  Laterality: N/A;   Patient Active Problem List   Diagnosis Date Noted   Obesity due to excess calories with body mass index (BMI) 120% of 95th percentile to less than 140% of 95th percentile for age in pediatric patient 11/15/2023   Viral upper respiratory tract infection 08/07/2023   Myalgia 03/29/2023   Chronic midline thoracic back pain 03/29/2023   Macromastia 03/29/2023   Lower extremity edema 03/29/2023   Urinary tract infection without hematuria 12/02/2022   Arthralgia  10/13/2022   Nerve pain 09/20/2022   Insomnia due to medical condition 09/20/2022   Irritant contact dermatitis associated with fecal stoma 07/14/2022   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   Anemia, iron  deficiency 02/22/2022   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) 12/21/2021   Elevated troponin 10/07/2021   H/O colon cancer, stage II 10/07/2021   Fever 10/07/2021   Palliative care patient 08/17/2021   PICC (peripherally inserted central catheter) in place 08/17/2021   Genetic testing 08/04/2021   FAP (familial adenomatous polyposis) 08/04/2021   Family history of colon cancer 06/29/2021   Family history of breast cancer 06/29/2021   Family history of stomach cancer 06/29/2021   Prolonged Q-T interval on ECG    Hypokalemia    Acute blood loss anemia    Sinus tachycardia    Debility 05/01/2021   Malignant neoplasm of sigmoid colon (HCC)    Diverticulitis of large intestine with abscess 04/10/2021   Essential hypertension 04/10/2021   SIRS (systemic inflammatory response syndrome) (HCC) 04/10/2021   Hypokalemia, inadequate intake 04/10/2021   Reactive thrombocytosis 04/10/2021   Prolonged QT interval 04/10/2021   Diverticulitis of colon with perforation 11/04/2020   Anemia 07/11/2019   Inappropriate sinus tachycardia (HCC) 07/11/2019   Obstructive sleep apnea 11/27/2018   Morbid obesity with body mass index of 60.0-69.9 in adult [facility]) 07/31/2018   Class 3 severe obesity due to excess calories with serious comorbidity and body  mass index (BMI) of 50.0 to 59.9 in adult    SVD (spontaneous vaginal delivery) 12/16/2012    PCP: Jarold Medici, MD   REFERRING PROVIDER: Lanny Callander, MD   REFERRING DIAG: C18.7 (ICD-10-CM) - Malignant neoplasm of sigmoid colon (HCC)  LBP not cancer related  Rationale for Evaluation and Treatment: Rehabilitation  THERAPY DIAG:  No diagnosis found.  ONSET DATE: ***  SUBJECTIVE:                                                                                                                                                                                            SUBJECTIVE STATEMENT: ***  PERTINENT HISTORY:  Colon cancer  PAIN:  Are you having pain? Yes: NPRS scale: *** Pain location: *** Pain description: *** Aggravating factors: *** Relieving factors: ***  PRECAUTIONS: {Therapy precautions:24002}  RED FLAGS: {PT Red Flags:29287}   WEIGHT BEARING RESTRICTIONS: {Yes ***/No:24003}  FALLS:  Has patient fallen in last 6 months? {fallsyesno:27318}  LIVING ENVIRONMENT: Lives with: {[facility] lives with:25569::lives with their family} Lives in: {Lives in:25570} Stairs: {opstairs:27293} Has following equipment at home: {Assistive devices:23999}  OCCUPATION: ***  PLOF: {PLOF:24004}  PATIENT GOALS: ***  NEXT MD VISIT: ***  OBJECTIVE:  Note: Objective measures were completed at Evaluation unless otherwise noted.  DIAGNOSTIC FINDINGS:  ***  PATIENT SURVEYS:  {rehab surveys:24030}  COGNITION: Overall cognitive status: {cognition:24006}     SENSATION: {sensation:27233}  MUSCLE LENGTH: Hamstrings: Right *** deg; Left *** deg Debby test: Right *** deg; Left *** deg  POSTURE: {posture:25561}  PALPATION: ***  LUMBAR ROM:   AROM eval  Flexion   Extension   Right lateral flexion   Left lateral flexion   Right rotation   Left rotation    (Blank rows = not tested)  LOWER EXTREMITY ROM:     {AROM/PROM:27142}  Right eval Left eval  Hip flexion    Hip extension    Hip abduction    Hip adduction    Hip internal rotation    Hip external rotation    Knee flexion    Knee extension    Ankle dorsiflexion    Ankle plantarflexion    Ankle inversion    Ankle eversion     (Blank rows = not tested)  LOWER EXTREMITY MMT:    MMT Right eval Left eval  Hip flexion    Hip extension    Hip abduction    Hip adduction    Hip internal rotation    Hip external rotation    Knee flexion    Knee  extension    Ankle dorsiflexion    Ankle plantarflexion  Ankle inversion    Ankle eversion     (Blank rows = not tested)  LUMBAR SPECIAL TESTS:  {lumbar special test:25242}  FUNCTIONAL TESTS:  5 times sit to stand: ***  GAIT: Distance walked: *** Assistive device utilized: {Assistive devices:23999} Level of assistance: {Levels of assistance:24026} Comments: ***  TREATMENT DATE: ***                                                                                                                                 PATIENT EDUCATION:  Education details: *** Person educated: {Person educated:25204} Education method: {Education Method:25205} Education comprehension: {Education Comprehension:25206}  HOME EXERCISE PROGRAM: ***  ASSESSMENT:  CLINICAL IMPRESSION: Patient is a *** y.o. *** who was seen today for physical therapy evaluation and treatment for ***.   OBJECTIVE IMPAIRMENTS: {opptimpairments:25111}.   ACTIVITY LIMITATIONS: {activitylimitations:27494}  PARTICIPATION LIMITATIONS: {participationrestrictions:25113}  PERSONAL FACTORS: {Personal factors:25162} are also affecting patient's functional outcome.   REHAB POTENTIAL: {rehabpotential:25112}  CLINICAL DECISION MAKING: {clinical decision making:25114}  EVALUATION COMPLEXITY: {Evaluation complexity:25115}   GOALS: Goals reviewed with patient? {yes/no:20286}  SHORT TERM GOALS: Target date: ***  *** Baseline: Goal status: INITIAL  2.  *** Baseline:  Goal status: INITIAL  3.  *** Baseline:  Goal status: INITIAL  4.  *** Baseline:  Goal status: INITIAL  5.  *** Baseline:  Goal status: INITIAL  6.  *** Baseline:  Goal status: INITIAL  LONG TERM GOALS: Target date: ***  *** Baseline:  Goal status: INITIAL  2.  *** Baseline:  Goal status: INITIAL  3.  *** Baseline:  Goal status: INITIAL  4.  *** Baseline:  Goal status: INITIAL  5.  *** Baseline:  Goal status: INITIAL  6.   *** Baseline:  Goal status: INITIAL  PLAN:  PT FREQUENCY: {rehab frequency:25116}  PT DURATION: {rehab duration:25117}  PLANNED INTERVENTIONS: {rehab planned interventions:25118::97110-Therapeutic exercises,97530- Therapeutic [phone]- Neuromuscular re-education,97535- Self Rjmz,02859- Manual therapy}.  PLAN FOR NEXT SESSION: ***   Jeramia Saleeby, PT 12/26/2023, 7:52 AM

## 2023-12-29 ENCOUNTER — Telehealth: Payer: Self-pay | Admitting: Hematology

## 2023-12-29 NOTE — Telephone Encounter (Signed)
 Scheduled appointments per WQ. Talked with the patient and she is aware of all made appointments.

## 2024-01-05 ENCOUNTER — Other Ambulatory Visit: Payer: Self-pay | Admitting: Nurse Practitioner

## 2024-01-05 ENCOUNTER — Inpatient Hospital Stay

## 2024-01-05 VITALS — BP 141/77 | HR 82 | Temp 98.2°F | Resp 18 | Wt 358.4 lb

## 2024-01-05 DIAGNOSIS — I10 Essential (primary) hypertension: Secondary | ICD-10-CM | POA: Diagnosis not present

## 2024-01-05 DIAGNOSIS — C786 Secondary malignant neoplasm of retroperitoneum and peritoneum: Secondary | ICD-10-CM

## 2024-01-05 DIAGNOSIS — Z452 Encounter for adjustment and management of vascular access device: Secondary | ICD-10-CM

## 2024-01-05 DIAGNOSIS — K760 Fatty (change of) liver, not elsewhere classified: Secondary | ICD-10-CM | POA: Diagnosis not present

## 2024-01-05 DIAGNOSIS — D259 Leiomyoma of uterus, unspecified: Secondary | ICD-10-CM | POA: Diagnosis not present

## 2024-01-05 DIAGNOSIS — J90 Pleural effusion, not elsewhere classified: Secondary | ICD-10-CM | POA: Diagnosis not present

## 2024-01-05 DIAGNOSIS — D500 Iron deficiency anemia secondary to blood loss (chronic): Secondary | ICD-10-CM

## 2024-01-05 DIAGNOSIS — N83209 Unspecified ovarian cyst, unspecified side: Secondary | ICD-10-CM | POA: Diagnosis not present

## 2024-01-05 DIAGNOSIS — R222 Localized swelling, mass and lump, trunk: Secondary | ICD-10-CM | POA: Diagnosis not present

## 2024-01-05 DIAGNOSIS — C187 Malignant neoplasm of sigmoid colon: Secondary | ICD-10-CM

## 2024-01-05 DIAGNOSIS — R590 Localized enlarged lymph nodes: Secondary | ICD-10-CM | POA: Diagnosis not present

## 2024-01-05 DIAGNOSIS — Z7901 Long term (current) use of anticoagulants: Secondary | ICD-10-CM | POA: Diagnosis not present

## 2024-01-05 DIAGNOSIS — M2550 Pain in unspecified joint: Secondary | ICD-10-CM | POA: Diagnosis not present

## 2024-01-05 DIAGNOSIS — K5720 Diverticulitis of large intestine with perforation and abscess without bleeding: Secondary | ICD-10-CM | POA: Diagnosis not present

## 2024-01-05 DIAGNOSIS — Z5112 Encounter for antineoplastic immunotherapy: Secondary | ICD-10-CM | POA: Diagnosis not present

## 2024-01-05 DIAGNOSIS — I2510 Atherosclerotic heart disease of native coronary artery without angina pectoris: Secondary | ICD-10-CM | POA: Diagnosis not present

## 2024-01-05 DIAGNOSIS — R519 Headache, unspecified: Secondary | ICD-10-CM | POA: Diagnosis not present

## 2024-01-05 DIAGNOSIS — G4730 Sleep apnea, unspecified: Secondary | ICD-10-CM | POA: Diagnosis not present

## 2024-01-05 DIAGNOSIS — Z1509 Genetic susceptibility to other malignant neoplasm: Secondary | ICD-10-CM | POA: Diagnosis not present

## 2024-01-05 DIAGNOSIS — D3502 Benign neoplasm of left adrenal gland: Secondary | ICD-10-CM | POA: Diagnosis not present

## 2024-01-05 DIAGNOSIS — Z1501 Genetic susceptibility to malignant neoplasm of breast: Secondary | ICD-10-CM | POA: Diagnosis not present

## 2024-01-05 DIAGNOSIS — Z933 Colostomy status: Secondary | ICD-10-CM | POA: Diagnosis not present

## 2024-01-05 DIAGNOSIS — R911 Solitary pulmonary nodule: Secondary | ICD-10-CM | POA: Diagnosis not present

## 2024-01-05 DIAGNOSIS — G8929 Other chronic pain: Secondary | ICD-10-CM | POA: Diagnosis not present

## 2024-01-05 DIAGNOSIS — K8020 Calculus of gallbladder without cholecystitis without obstruction: Secondary | ICD-10-CM | POA: Diagnosis not present

## 2024-01-05 DIAGNOSIS — K6819 Other retroperitoneal abscess: Secondary | ICD-10-CM | POA: Diagnosis not present

## 2024-01-05 DIAGNOSIS — C7951 Secondary malignant neoplasm of bone: Secondary | ICD-10-CM | POA: Diagnosis not present

## 2024-01-05 LAB — CMP (CANCER CENTER ONLY)
ALT: 13 U/L (ref 0–44)
AST: 11 U/L — ABNORMAL LOW (ref 15–41)
Albumin: 3.8 g/dL (ref 3.5–5.0)
Alkaline Phosphatase: 50 U/L (ref 38–126)
Anion gap: 7 (ref 5–15)
BUN: 13 mg/dL (ref 6–20)
CO2: 25 mmol/L (ref 22–32)
Calcium: 8.6 mg/dL — ABNORMAL LOW (ref 8.9–10.3)
Chloride: 107 mmol/L (ref 98–111)
Creatinine: 0.67 mg/dL (ref 0.44–1.00)
GFR, Estimated: >60 mL/min (ref >60)
Glucose, Bld: 92 mg/dL (ref 70–99)
Potassium: 3.8 mmol/L (ref 3.5–5.1)
Sodium: 139 mmol/L (ref 135–145)
Total Bilirubin: 0.4 mg/dL (ref 0.0–1.2)
Total Protein: 7.3 g/dL (ref 6.5–8.1)

## 2024-01-05 LAB — CBC WITH DIFFERENTIAL (CANCER CENTER ONLY)
Abs Immature Granulocytes: 0.03 K/uL (ref 0.00–0.07)
Basophils Absolute: 0 K/uL (ref 0.0–0.1)
Basophils Relative: 1 %
Eosinophils Absolute: 0.1 K/uL (ref 0.0–0.5)
Eosinophils Relative: 1 %
HCT: 31.2 % — ABNORMAL LOW (ref 36.0–46.0)
Hemoglobin: 10.1 g/dL — ABNORMAL LOW (ref 12.0–15.0)
Immature Granulocytes: 1 %
Lymphocytes Relative: 26 %
Lymphs Abs: 1.7 K/uL (ref 0.7–4.0)
MCH: 26.4 pg (ref 26.0–34.0)
MCHC: 32.4 g/dL (ref 30.0–36.0)
MCV: 81.5 fL (ref 80.0–100.0)
Monocytes Absolute: 0.4 K/uL (ref 0.1–1.0)
Monocytes Relative: 6 %
Neutro Abs: 4.4 K/uL (ref 1.7–7.7)
Neutrophils Relative %: 65 %
Platelet Count: 453 K/uL — ABNORMAL HIGH (ref 150–400)
RBC: 3.83 MIL/uL — ABNORMAL LOW (ref 3.87–5.11)
RDW: 15.4 % (ref 11.5–15.5)
WBC Count: 6.7 K/uL (ref 4.0–10.5)
nRBC: 0 % (ref 0.0–0.2)

## 2024-01-05 LAB — FERRITIN: Ferritin: 17 ng/mL (ref 11–307)

## 2024-01-05 LAB — CEA (ACCESS): CEA (CHCC): <1 ng/mL (ref 0.00–5.00)

## 2024-01-05 LAB — VITAMIN B12: Vitamin B-12: 308 pg/mL (ref 180–914)

## 2024-01-05 MED ORDER — SODIUM CHLORIDE 0.9 % IV SOLN
200.0000 mg | Freq: Once | INTRAVENOUS | Status: AC
  Administered 2024-01-05: 200 mg via INTRAVENOUS
  Filled 2024-01-05: qty 200

## 2024-01-05 MED ORDER — SODIUM CHLORIDE 0.9 % IV SOLN: Freq: Once | INTRAVENOUS | Status: AC

## 2024-01-05 MED ORDER — SODIUM CHLORIDE 0.9% FLUSH
10.0000 mL | Freq: Once | INTRAVENOUS | Status: AC
Start: 2024-01-05 — End: 2024-01-05
  Administered 2024-01-05: 10 mL

## 2024-01-05 MED ORDER — HEPARIN SOD (PORK) LOCK FLUSH 100 UNIT/ML IV SOLN
500.0000 [IU] | Freq: Once | INTRAVENOUS | Status: AC | PRN
Start: 2024-01-05 — End: 2024-01-05
  Administered 2024-01-05: 500 [IU]

## 2024-01-05 MED ORDER — SODIUM CHLORIDE 0.9% FLUSH
10.0000 mL | INTRAVENOUS | Status: DC | PRN
  Administered 2024-01-05: 10 mL

## 2024-01-05 NOTE — Patient Instructions (Signed)

## 2024-01-08 NOTE — Therapy (Incomplete)
 OUTPATIENT PHYSICAL THERAPY THORACOLUMBAR EVALUATION   Patient Name: Mikayla Rios MRN: [id number] DOB:Dec 18, 1985, 38 y.o., female Today's Date: 01/08/2024  END OF SESSION:   Past Medical History:  Diagnosis Date   Allergy    seasonal   Anemia    Blood infection 1985   Blood transfusion without reported diagnosis    colon ca 04/2021   Diverticulitis    Family history of breast cancer    Family history of colon cancer    Family history of colon cancer    Family history of stomach cancer    Hypertension    Obesity    Sleep apnea    Past Surgical History:  Procedure Laterality Date   COLECTOMY WITH COLOSTOMY CREATION/HARTMANN PROCEDURE N/A 04/15/2021   Procedure: COLOSTOMY CREATION/HARTMANN PROCEDURE;  Surgeon: Sheldon Standing, MD;  Location: [facility];  Service: General;  Laterality: N/A;   IR CATHETER TUBE CHANGE  12/12/2020   IR CATHETER TUBE CHANGE  01/27/2021   IR CATHETER TUBE CHANGE  02/19/2021   IR CATHETER TUBE CHANGE  04/13/2021   IR IMAGING GUIDED PORT INSERTION  10/02/2021   IR RADIOLOGIST EVAL & MGMT  11/27/2020   IR RADIOLOGIST EVAL & MGMT  12/11/2020   IR RADIOLOGIST EVAL & MGMT  01/07/2021   IR RADIOLOGIST EVAL & MGMT  02/04/2021   IR SINUS/FIST TUBE CHK-NON GI  12/12/2020   IR SINUS/FIST TUBE CHK-NON GI  02/19/2021   LAPAROSCOPIC PARTIAL COLECTOMY N/A 04/15/2021   Procedure: LAPAROSCOPIC ASSISTED HARTMANN RESECTION;  Surgeon: Sheldon Standing, MD;  Location: [facility];  Service: General;  Laterality: N/A;   Patient Active Problem List   Diagnosis Date Noted   Obesity due to excess calories with body mass index (BMI) 120% of 95th percentile to less than 140% of 95th percentile for age in pediatric patient 11/15/2023   Viral upper respiratory tract infection 08/07/2023   Myalgia 03/29/2023   Chronic midline thoracic back pain 03/29/2023   Macromastia 03/29/2023   Lower extremity edema 03/29/2023   Urinary tract infection without hematuria 12/02/2022   Arthralgia  10/13/2022   Nerve pain 09/20/2022   Insomnia due to medical condition 09/20/2022   Irritant contact dermatitis associated with fecal stoma 07/14/2022   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   Anemia, iron  deficiency 02/22/2022   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) 12/21/2021   Elevated troponin 10/07/2021   H/O colon cancer, stage II 10/07/2021   Fever 10/07/2021   Palliative care patient 08/17/2021   PICC (peripherally inserted central catheter) in place 08/17/2021   Genetic testing 08/04/2021   FAP (familial adenomatous polyposis) 08/04/2021   Family history of colon cancer 06/29/2021   Family history of breast cancer 06/29/2021   Family history of stomach cancer 06/29/2021   Prolonged Q-T interval on ECG    Hypokalemia    Acute blood loss anemia    Sinus tachycardia    Debility 05/01/2021   Malignant neoplasm of sigmoid colon (HCC)    Diverticulitis of large intestine with abscess 04/10/2021   Essential hypertension 04/10/2021   SIRS (systemic inflammatory response syndrome) (HCC) 04/10/2021   Hypokalemia, inadequate intake 04/10/2021   Reactive thrombocytosis 04/10/2021   Prolonged QT interval 04/10/2021   Diverticulitis of colon with perforation 11/04/2020   Anemia 07/11/2019   Inappropriate sinus tachycardia (HCC) 07/11/2019   Obstructive sleep apnea 11/27/2018   Morbid obesity with body mass index of 60.0-69.9 in adult [facility]) 07/31/2018   Class 3 severe obesity due to excess calories with serious comorbidity and body  mass index (BMI) of 50.0 to 59.9 in adult    SVD (spontaneous vaginal delivery) 12/16/2012    PCP:   REFERRING PROVIDER: Dr [name]  REFERRING DIAG: Back pain, non cancer related  Rationale for Evaluation and Treatment: Rehabilitation  THERAPY DIAG:  No diagnosis found.  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 A CT in 06/2021 revealed metastatic disease in left psoas, abdominal wall and pertitoneal areas. She underwent chemo therapy at that time. She is currently on Keytruda  and her last guardant reveal on 10/13/2023 was negative. She has a LUQ colostomy. She has had some anemia believed to be due to heavy menstrual bleeding. She has ongoing body and back pain.  PAIN:  Are you having pain? {OPRCPAIN:27236}  PRECAUTIONS: hx of colon cancer with colostomy  RED FLAGS: {PT Red Flags:29287}   WEIGHT BEARING RESTRICTIONS: {Yes ***/No:24003}  FALLS:  Has patient fallen in last 6 months? {fallsyesno:27318}  LIVING ENVIRONMENT: Lives with: {OPRC lives with:25569::lives with their family} Lives in: {Lives in:25570} Stairs: {opstairs:27293} Has following equipment at home: {Assistive devices:23999}  OCCUPATION: ***  PLOF: {PLOF:24004}  PATIENT GOALS: ***  NEXT MD VISIT: ***  OBJECTIVE:  Note: Objective measures were completed at Evaluation unless otherwise noted.  DIAGNOSTIC FINDINGS:  ***  PATIENT SURVEYS:  Modified Oswestry:  MODIFIED OSWESTRY DISABILITY SCALE  Date: *** Score  Pain intensity {ODI 1:32962}  2. Personal care (washing, dressing, etc.) {ODI 2:32963}  3. Lifting {ODI 3:32964}  4. Walking {ODI 4:32965}  5. Sitting {ODI 5:32966}  6. Standing {ODI 6:32967}  7. Sleeping {ODI 7:32968}  8. Social Life {ODI 8:32969}  9. Traveling {ODI 9:32970}  10. Employment/ Homemaking {ODI 10:32971}  Total ***/50   Interpretation of scores: Score Category Description  0-20% Minimal Disability The patient can cope with most living activities. Usually no treatment is indicated apart from advice on lifting, sitting and  exercise  21-40% Moderate Disability The patient experiences more pain and difficulty with sitting, lifting and standing. Travel and social life are more difficult and they may be disabled from work. Personal care, sexual activity and sleeping are not grossly affected, and the patient can usually be managed by conservative means  41-60% Severe Disability Pain remains the main problem in this group, but activities of daily living are affected. These patients require a detailed investigation  61-80% Crippled Back pain impinges on all aspects of the patient's life. Positive intervention is required  81-100% Bed-bound  These patients are either bed-bound or exaggerating their symptoms  Bluford FORBES Zoe DELENA Karon DELENA, et al. Surgery versus conservative management of stable thoracolumbar fracture: the PRESTO feasibility RCT. [place]): [employer]; 2021 Nov. [facility] Technology Assessment, No. 25.62.) Appendix 3, Oswestry Disability Index category descriptors. Available from: [employer]  Minimally Clinically Important Difference (MCID) = 12.8%  COGNITION: Overall cognitive status: Within functional limits for tasks assessed     SENSATION: {sensation:27233}  MUSCLE LENGTH: Hamstrings: Right *** deg; Left *** deg Debby test: Right *** deg; Left *** deg  POSTURE: {posture:25561}  PALPATION: ***  LUMBAR ROM:   AROM eval  Flexion   Extension   Right lateral flexion   Left lateral flexion   Right rotation   Left rotation    (Blank rows = not tested)  LOWER EXTREMITY ROM:     {AROM/PROM:27142}  Right eval Left eval  Hip flexion    Hip extension    Hip abduction    Hip adduction    Hip internal rotation    Hip external rotation    Knee flexion    Knee extension    Ankle dorsiflexion    Ankle plantarflexion    Ankle inversion    Ankle eversion     (Blank rows = not tested)  LOWER EXTREMITY MMT:    MMT Right eval Left eval   Hip flexion    Hip extension    Hip abduction    Hip adduction    Hip internal rotation    Hip external rotation    Knee flexion    Knee extension    Ankle dorsiflexion    Ankle plantarflexion    Ankle inversion    Ankle eversion     (Blank rows = not tested)  LUMBAR SPECIAL TESTS:  {lumbar special test:25242}  FUNCTIONAL TESTS:  {Functional tests:24029}  GAIT: Distance walked: *** Assistive device utilized: {Assistive devices:23999} Level of assistance: {Levels of assistance:24026} Comments: ***  TREATMENT DATE: ***                                                                                                                                 PATIENT EDUCATION:  Education details: *** Person educated: {Person educated:25204} Education method: {Education Method:25205} Education comprehension: {Education Comprehension:25206}  HOME EXERCISE PROGRAM: ***  ASSESSMENT:  CLINICAL IMPRESSION: Patient is a 38 y.o. female who was seen today for physical therapy evaluation and treatment for complaints of LBP.   OBJECTIVE IMPAIRMENTS: {opptimpairments:25111}.   ACTIVITY LIMITATIONS: {activitylimitations:27494}  PARTICIPATION LIMITATIONS: {participationrestrictions:25113}  PERSONAL FACTORS: {Personal factors:25162} are also affecting patient's functional outcome.   REHAB POTENTIAL: {rehabpotential:25112}  CLINICAL DECISION MAKING: {clinical decision making:25114}  EVALUATION  COMPLEXITY: {Evaluation complexity:25115}   GOALS: Goals reviewed with patient? {yes/no:20286}  SHORT TERM GOALS: Target date: ***  Pt will be independent in a HEP for ROM and core strength Baseline: Goal status: INITIAL  2.  Pt will have decreased Back pain by 25% Baseline:  Goal status: INITIAL  3.  *** Baseline:  Goal status: INITIAL  4.  *** Baseline:  Goal status: INITIAL  5.  *** Baseline:  Goal status: INITIAL  6.  *** Baseline:  Goal status: INITIAL  LONG TERM  GOALS: Target date: ***  Pt will have decreased back pain by 50% or greater for improved ability to tolerate home activities Baseline:  Goal status: INITIAL  2.  ODI will be no greater than   to demonstrate improved function Baseline:  Goal status: INITIAL  3.  *** Baseline:  Goal status: INITIAL  4.  *** Baseline:  Goal status: INITIAL  5.  *** Baseline:  Goal status: INITIAL  6.  *** Baseline:  Goal status: INITIAL  PLAN:  PT FREQUENCY: {rehab frequency:25116}  PT DURATION: {rehab duration:25117}  PLANNED INTERVENTIONS: {rehab planned interventions:25118::97110-Therapeutic exercises,97530- Therapeutic [phone]- Neuromuscular re-education,97535- Self Rjmz,02859- Manual therapy}.  PLAN FOR NEXT SESSION: PIERRETTE Grayce JINNY Canary, PT 01/08/2024, 8:48 AM

## 2024-01-09 ENCOUNTER — Ambulatory Visit

## 2024-01-15 NOTE — Progress Notes (Deleted)
 Cardiology Office Note:    Date:  01/15/2024   ID:  Mikayla Rios, DOB May 01, 1986, MRN [id number]  PCP:  Mikayla Medici, MD  Cardiologist:  Mikayla LITTIE Nanas, MD { Click to update primary MD,subspecialty MD or APP then REFRESH:1}    Referring MD: Mikayla Medici, MD   Chief Complaint: follow-up of hypertension  History of Present Illness:    Mikayla Rios is a 38 y.o. female with a history of coronary artery calcifications noted on prior CTs, palpitations/tachycardia with no significant arrhythmias noted on monitor in 11/2019 hypertension, hyperlipidemia, obstructive sleep apnea, perforated diverticulitis with retroperitoneal abscess s/p colectomy and end colostomy in 04/2021, colon cancer, anemia, chronic pain, and morbid obesity (BMI 61) who is followed by Dr. Nanas and presents today for follow-up of hypertension.   Patient was referred to Dr. Nanas in 10/2019 for further evaluation of tachycardia with episodes of what patient described as heart racing a couple times per day. She also reported some dyspnea with exertion at that time. Echo and ZIO monitor were ordered for further evaluation. Echo showed LVEF of 35% with mild LVH but no regional wall motion abnormalities and normal diastolic parameters. ZIO monitor showed rare PAC/PVCs but no significant rhythm.    She had multiple admissions in 2022 and 2023 for noncardiac issues. She was admitted in 04/2021 for septic shock secondary to chronic perperforated colon with retroperitoneal abscess and partial small bowel obstruction during one of these admissions. 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 Echo during that admission showed LVEF of 60-65% with no regional wall motion abnormalities and no significant valvular disease.   She was last seen by me in 09/2023 at which time she reported some lower extremity edema over the last 4 months as well as elevated BP but  was otherwise doing well form a cardiac standpoint. She was continued on PRN Lasix  and Valsartan  was increased for additional BP control. She was also started on Aspirin  and Lipitor for coronary artery calcifications. Home sleep study was also ordered.   Patient presents today for follow-up. ***  Hypertension BP well controlled. *** - Continue current medications: Amlodipine  10mg  daily, Valsartan  320mg  daily, and Coreg  18.75mg  twice daily.   Coronary Calcifications Prior CT scan in 10/2021 showed mild coronary calcifications. - No chest pain or anginal equivalent symptoms.  - Not currently on aspirin  or a statin.  - Continue Aspirin  81mg  daily and Lipitor 40mg  daily.    History of Palpitations/ Sinus Tachycardia Zio monitor in 10/2019 showed rare PACs/PVCs but no significant arrhythmias.  - Well controlled on higher dose of beta-blocker. No recent palpitations. *** - Continue Coreg  18.75mg  twice daily.     Hyperlipidemia Lipid panel in 12/2023: Total Cholesterol 117, Triglycerides 64, HDL 46, LDL 58. LDL goal <70 given coronary artery calcifications.  - Continue Lipitor 40mg  daily.    Chronic Lower Extremity Edema - Stable. *** - Continue Lasix  20mg  daily as needed for worsening edema.    Obstructive Sleep Apnea She has a history of OSA but has not used her CPAP machine in 2 years. Itmar home sleep study was ordered at last visit. ***   Morbid Obesity  BMI ***   EKGs/Labs/Other Studies Reviewed:    The following studies were reviewed:   Zio Monitor 10/23/2019 to 10/26/2019: No significant arrhythmias detected   3 days of data recorded on Zio monitor. Patient had a min HR of 55 bpm, max HR of  132 bpm, and avg HR of 81 bpm. Predominant underlying rhythm was Sinus Rhythm. No VT, SVT, atrial fibrillation, high degree block, or pauses noted. Isolated atrial and ventricular ectopy was rare (<1%). There were 0 triggered events. No significant arrhythmias detected. _______________    Echocardiogram 04/14/2021: Impressions:  1. Left ventricular ejection fraction, by estimation, is 60 to 65%. The  left ventricle has normal function. The left ventricle has no regional  wall motion abnormalities. Indeterminate diastolic filling due to E-A  fusion.   2. Right ventricular systolic function is normal. The right ventricular  size is normal.   3. The mitral valve is normal in structure. No evidence of mitral valve  regurgitation. No evidence of mitral stenosis.   4. The aortic valve is normal in structure. Aortic valve regurgitation is  not visualized. No aortic stenosis is present.   5. The inferior vena cava is normal in size with greater than 50%  respiratory variability, suggesting right atrial pressure of 3 mmHg.   EKG:  EKG not ordered today.   Recent Labs: 12/15/2023: TSH 1.550 01/05/2024: ALT 13; BUN 13; Creatinine 0.67; Hemoglobin 10.1; Platelet Count 453; Potassium 3.8; Sodium 139  Recent Lipid Panel    Component Value Date/Time   CHOL 117 12/15/2023 1353   CHOL 205 (H) 11/30/2022 1726   TRIG 64 12/15/2023 1353   HDL 46 12/15/2023 1353   HDL 60 11/30/2022 1726   CHOLHDL 2.5 12/15/2023 1353   VLDL 13 12/15/2023 1353   LDLCALC 58 12/15/2023 1353   LDLCALC 124 (H) 11/30/2022 1726    Physical Exam:    Vital Signs: There were no vitals taken for this visit.    Wt Readings from Last 3 Encounters:  01/05/24 (!) 358 lb 6.4 oz (162.6 kg)  12/15/23 (!) 356 lb 11.2 oz (161.8 kg)  11/03/23 (!) 355 lb 12.8 oz (161.4 kg)     General: 38 y.o. female in no acute distress. HEENT: Normocephalic and atraumatic. Sclera clear.  Neck: Supple. No carotid bruits. No JVD. Heart: *** RRR. Distinct S1 and S2. No murmurs, gallops, or rubs.  Lungs: No increased work of breathing. Clear to ausculation bilaterally. No wheezes, rhonchi, or rales.  Abdomen: Soft, non-distended, and non-tender to palpation.  Extremities: No lower extremity edema.  Radial and distal pedal pulses  2+ and equal bilaterally. Skin: Warm and dry. Neuro: No focal deficits. Psych: Normal affect. Responds appropriately.   Assessment:    No diagnosis found.  Plan:     Disposition: Follow up in ***   Signed, Aline FORBES Door, PA-C  01/15/2024 3:59 PM    [facility]

## 2024-01-17 DIAGNOSIS — Z933 Colostomy status: Secondary | ICD-10-CM | POA: Diagnosis not present

## 2024-01-22 NOTE — Therapy (Signed)
 OUTPATIENT PHYSICAL THERAPY THORACOLUMBAR EVALUATION   Patient Name: Mikayla Rios MRN: [id number] DOB:[birth year], 38 y.o., female Today's Date: 01/23/2024  END OF SESSION:  PT End of Session - 01/23/24 1722     Visit Number 1    Number of Visits 16    Date for PT Re-Evaluation 03/19/24    Authorization Type None needed    Authorization - Visit Number 1    Authorization - Number of Visits 30   yearly total   PT Start Time 1605    PT Stop Time 1701    PT Time Calculation (min) 56 min    Activity Tolerance Patient tolerated treatment well    Behavior During Therapy WFL for tasks assessed/performed          Past Medical History:  Diagnosis Date   Allergy    seasonal   Anemia    Blood infection 1985   Blood transfusion without reported diagnosis    colon ca 04/2021   Diverticulitis    Family history of breast cancer    Family history of colon cancer    Family history of colon cancer    Family history of stomach cancer    Hypertension    Obesity    Sleep apnea    Past Surgical History:  Procedure Laterality Date   COLECTOMY WITH COLOSTOMY CREATION/HARTMANN PROCEDURE N/A 04/15/2021   Procedure: COLOSTOMY CREATION/HARTMANN PROCEDURE;  Surgeon: Sheldon Standing, MD;  Location: [facility];  Service: General;  Laterality: N/A;   IR CATHETER TUBE CHANGE  12/12/2020   IR CATHETER TUBE CHANGE  01/27/2021   IR CATHETER TUBE CHANGE  02/19/2021   IR CATHETER TUBE CHANGE  04/13/2021   IR IMAGING GUIDED PORT INSERTION  10/02/2021   IR RADIOLOGIST EVAL & MGMT  11/27/2020   IR RADIOLOGIST EVAL & MGMT  12/11/2020   IR RADIOLOGIST EVAL & MGMT  01/07/2021   IR RADIOLOGIST EVAL & MGMT  02/04/2021   IR SINUS/FIST TUBE CHK-NON GI  12/12/2020   IR SINUS/FIST TUBE CHK-NON GI  02/19/2021   LAPAROSCOPIC PARTIAL COLECTOMY N/A 04/15/2021   Procedure: LAPAROSCOPIC ASSISTED HARTMANN RESECTION;  Surgeon: Sheldon Standing, MD;  Location: [facility];  Service: General;  Laterality: N/A;   Patient Active  Problem List   Diagnosis Date Noted   Obesity due to excess calories with body mass index (BMI) 120% of 95th percentile to less than 140% of 95th percentile for age in pediatric patient 11/15/2023   Viral upper respiratory tract infection 08/07/2023   Myalgia 03/29/2023   Chronic midline thoracic back pain 03/29/2023   Macromastia 03/29/2023   Lower extremity edema 03/29/2023   Urinary tract infection without hematuria 12/02/2022   Arthralgia 10/13/2022   Nerve pain 09/20/2022   Insomnia due to medical condition 09/20/2022   Irritant contact dermatitis associated with fecal stoma 07/14/2022   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   Anemia, iron  deficiency 02/22/2022   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) 12/21/2021   Elevated troponin 10/07/2021   H/O colon cancer, stage II 10/07/2021   Fever 10/07/2021   Palliative care patient 08/17/2021   PICC (peripherally inserted central catheter) in place 08/17/2021   Genetic testing 08/04/2021   FAP (familial adenomatous polyposis) 08/04/2021   Family history of colon cancer 06/29/2021   Family history of breast cancer 06/29/2021   Family history of stomach cancer 06/29/2021   Prolonged Q-T interval on ECG    Hypokalemia    Acute blood loss anemia    Sinus tachycardia  Debility 05/01/2021   Malignant neoplasm of sigmoid colon (HCC)    Diverticulitis of large intestine with abscess 04/10/2021   Essential hypertension 04/10/2021   SIRS (systemic inflammatory response syndrome) (HCC) 04/10/2021   Hypokalemia, inadequate intake 04/10/2021   Reactive thrombocytosis 04/10/2021   Prolonged QT interval 04/10/2021   Diverticulitis of colon with perforation 11/04/2020   Anemia 07/11/2019   Inappropriate sinus tachycardia (HCC) 07/11/2019   Obstructive sleep apnea 11/27/2018   Morbid obesity with body mass index of 60.0-69.9 in adult [facility]) 07/31/2018   Class 3 severe obesity due to excess calories with serious comorbidity and body mass  index (BMI) of 50.0 to 59.9 in adult    SVD (spontaneous vaginal delivery) 12/16/2012    PCP:   REFERRING PROVIDER: Dr [name]  REFERRING DIAG: Back pain, Deconditioning  Rationale for Evaluation and Treatment: Rehabilitation  THERAPY DIAG:  Malignant neoplasm of colon, unspecified part of colon (HCC)  Muscle weakness (generalized)  Other low back pain  ONSET DATE: 2024  SUBJECTIVE:                                                                                                                                                                                           SUBJECTIVE STATEMENT: Pt is having immunotherapy and has all over joint pain from side effects. Over the last year her LBP has gotten worse. Worse with standing, prolonged sitting greater than 30 min, when she stands she gets burning all the way across her back.  We tried to walk for exercises for 30 min (.8 mile) and would stop and sit several times.  Now, I can't walk for 10 min because my back is on fire.  She bought a vibration plate but she can't stand greater than 10 mins. Has to sit and rest while making dinner. She gets cramps when showering over the stoma and  in both legs and they can be everywhere. Pain starts in the middle of her back and moves down to her thighs. Going up and down stairs her knees at times feel like they will give out. Most of the time I do right leg up first. She has some CIPN feet and hands. She recently got a waterproof colostomy bag, but she has not tried it yet. If it works for her she would be able to do the pool.  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 A CT in 06/2021 revealed metastatic disease in left psoas, abdominal  wall and pertitoneal areas. She underwent chemo therapy at that time. She is currently on Keytruda  and her last  guardant reveal on 10/13/2023 was negative. She has a LUQ colostomy. She has had some anemia believed to be due to heavy menstrual bleeding. She has ongoing body and back pain with some resulting from immunotherapy.  PAIN:  Are you having pain? Yes: NPRS scale: 5-6/10, best 3-4/10, worst in bed all day 8-9/10 Pain location: across LB, with cramps left hip, legs Pain description: burning in back and sometimes moves into posterior thighs Aggravating factors: sitting too long, standing more than 10 min, walking 10 min Relieving factors: change activities,   PRECAUTIONS: hx of colon cancer with colostomy  RED FLAGS: Bowel or bladder incontinence: Yes: colostomy  Has a waterproof colostomy bag she will try and see how she does. WEIGHT BEARING RESTRICTIONS: No  FALLS:  Has patient fallen in last 6 months? No  LIVING ENVIRONMENT: Lives with: lives with their spouse and 3 children, 32, 60, and 25 Lives in: House/apartment Stairs: Yes: Internal: 14 steps; on right going up and External: 4 steps; none Has following equipment at home: Quad cane small base, getting a shower bench  OCCUPATION: not working  PLOF: Independent with basic ADLsneeds help with socks, shoes, bathing,  PATIENT GOALS: Decrease, Be able to be more active     OBJECTIVE:  Note: Objective measures were completed at Evaluation unless otherwise noted.  DIAGNOSTIC FINDINGS:  No lytic disease noted with CT abdomen  PATIENT SURVEYS:  Modified Oswestry:  MODIFIED OSWESTRY DISABILITY SCALE  Date: 01/23/2024 Score  Pain intensity 5 =  Pain medication has no effect on my pain.  2. Personal care (washing, dressing, etc.) 3 =  I need help, but I am able to manage most of my personal care.  3. Lifting 2 = Pain prevents me from lifting heavy weights off the floor, activities (eg. sports, dancing). but I can manage if the weights are conveniently positioned (3) Pain prevents me from going out very often. (eg, on a table).  4.  Walking 3 =  Pain prevents me from walking more than  mile.  5. Sitting 2 =  Pain prevents me from sitting more than 1 hour.  6. Standing 4 =  Pain prevents me from standing more than 10 minutes.  7. Sleeping 3 =  Even when I take pain medication, I sleep less than 4 hours.  8. Social Life 3 =  Pain prevents me from going out very often.  9. Traveling 2 =  My pain restricts my travel over 2 hours.  10. Employment/ Homemaking 3 = Pain prevents me from doing anything but light duties.  Total 30/50 =60%   Interpretation of scores: Score Category Description  0-20% Minimal Disability The patient can cope with most living activities. Usually no treatment is indicated apart from advice on lifting, sitting and exercise  21-40% Moderate Disability The patient experiences more pain and difficulty with sitting, lifting and standing. Travel and social life are more difficult and they may be disabled from work. Personal care, sexual activity and sleeping are not grossly affected, and the patient can usually be managed by conservative means  41-60% Severe Disability Pain remains the main problem in this group, but activities of daily living are affected. These patients require a detailed investigation  61-80% Crippled Back pain impinges on all aspects of the patient's life. Positive intervention is required  81-100% Bed-bound  These patients are either bed-bound or exaggerating  their symptoms  Bluford FORBES Zoe DELENA Karon DELENA, et al. Surgery versus conservative management of stable thoracolumbar fracture: the PRESTO feasibility RCT. [place]): [employer]; 2021 Nov. [place] [facility] Technology Assessment, No. 25.62.) Appendix 3, Oswestry Disability Index category descriptors. Available from: [employer]cz  Minimally Clinically Important Difference (MCID) = 12.8%    LEFS: 7/80  COGNITION: Overall cognitive status: Within functional limits for tasks  assessed     SENSATION: CIPN fingertips, toes  MUSCLE LENGTH: Hamstrings: Right 70 deg; Left 55 deg Thomas test: Right - ; Left pain in back with left leg extended and Right knee to chest FABER; Felt good on back both legs done separately, mildly restricted bilaterally  POSTURE: rounded shoulders, forward head, increased lumbar lordosis, and anterior pelvic tilt, bilateral genu valgus  PALPATION: Right iliac high  LUMBAR ROM:   AROM eval  Flexion  Limited by body habitus Can reach hands several inches Above knees worse  Extension WFL better  Right lateral flexion Increased NW, reach to 1-2 inches above knee  Left lateral flexion Better, reach 1-2 inches above knee  Right rotation   Left rotation    (Blank rows = not tested)  LOWER EXTREMITY  MMT    STRENGTH  Right eval Left eval  Hip flexion 3+, trembles 4-  Hip extension    Hip abduction Seated 4/5 4+  Hip adduction    Hip internal rotation 4 4  Hip external rotation 4+ 4+  Knee flexion 5 5  Knee extension 4- 4  Ankle dorsiflexion 4+ 4+  Ankle plantarflexion    Ankle inversion    Ankle eversion     (Blank rows = not tested)     (Blank rows = not tested)  LUMBAR SPECIAL TESTS:  Straight leg raise test: Negative and FABER test: Negative  FUNCTIONAL TESTS:  30 second sit to stand;    TREATMENT DATE:  01/23/2024 Discussed POC, LOS, treatment interventions. Pt. would benefit from the pool and pt will check to see if her waterproof colostomy bags work so she can try aquatics. Pts pain improved with BB and was worse with FB. Educated how to check pain in standing, perform BB and recheck pain. Discussed stopping if pain is increased or may continue if pain is overall decreased. Showed how to position hands to support back or use a towel to support back. Educated in use of a lumbar roll for sitting if they have a proper chair. Scheduled in clinic 2x/week but will make 1 aquatic each week if waterproof pouch works  for colostomy.                                                                                                                                  PATIENT EDUCATION:  Education details: see 01/23/2024 treatment Person educated: Patient and Spouse Education method: Explanation, Demonstration, and Handouts Education comprehension: verbalized understanding and returned demonstration  HOME EXERCISE PROGRAM: Standing BB, checking  pain before and after and stopping if pain is increased Towel roll for sitting posture  ASSESSMENT:  CLINICAL IMPRESSION: Patient is a 38 y.o. female who was seen today for physical therapy evaluation and treatment for complaints of LBP and deconditioning. She has a history of colon cancer and has a colostomy.  She was given a waterproof pouch and will try it at home in hopes of going to aquatic therapy.She receives Keytruda  every 3 weeks. When she had her initial chemotherapy she lost all use of her body and had a month or more of Rehab. Lumbar ROM is limited by body habitus, however, back pain was worse with FB and improved with BB. She is observed having difficulty rising from chair, and with bed mobility. She demonstrates LE weakness with Manual muscle testing. She is limited functionally with sitting, standing, walking, stairs and most home activities. Oswestry demonstrates 60% disability, and LEFS score is 7/80. She will benefit from skilled PT to improve core and extremity strength, posture, mobility and to decrease pain.  OBJECTIVE IMPAIRMENTS: Abnormal gait, decreased activity tolerance, decreased balance, decreased knowledge of condition, decreased mobility, difficulty walking, decreased ROM, decreased strength, increased muscle spasms, improper body mechanics, postural dysfunction, obesity, and pain.   ACTIVITY LIMITATIONS: lifting, bending, sitting, standing, squatting, sleeping, stairs, bed mobility, bathing, dressing, locomotion level, and caring for  others  PARTICIPATION LIMITATIONS: meal prep, cleaning, laundry, shopping, and community activity  PERSONAL FACTORS: Past/current experiences and 3+ comorbidities: colon cancer, chemotherapy, obesity are also affecting patient's functional outcome.   REHAB POTENTIAL: Good  CLINICAL DECISION MAKING: Evolving/moderate complexity  EVALUATION COMPLEXITY: Moderate   GOALS: Goals reviewed with patient? Yes  SHORT TERM GOALS: Target date: 02/20/2024  Pt will be independent in a HEP for ROM, UE and LE and core strength Baseline: Goal status: INITIAL  2.  Pt will have decreased Back pain by 25% for improved tolerance for home activities Baseline:  Goal status: INITIAL  3.  Pt will be able to walk 10-15 min with several rest periods with min LBP Baseline:  Goal status: INITIAL  4.  Pt will be tolerant of aquatic therapy as indicated (has waterproof pouch that works) Baseline:  Goal status: INITIAL   LONG TERM GOALS: Target date: 03/19/2024 Pt will have decreased back pain by 50% or greater for improved ability to tolerate home activities Baseline:  Goal status: INITIAL  2.  ODI will be no greater than 30%  to demonstrate improved function Baseline: 60% Goal status: INITIAL  3.  Pt will be able to walk 8/10 of a mile with rest periods as done previously with minimal LBP Baseline:  Goal status: INITIAL  4.  Pt will be able to stand long enough to fix small meal without LBP Baseline:  Goal status: INITIAL  5.  Pt will be educated in proper body mechanics for home activities Baseline:  Goal status: INITIAL  6.  Pt will be able to go up stairs with alternate gait pattern Baseline:  Goal status: INITIAL  PLAN:  PT FREQUENCY: 2x/week  PT DURATION: 8 weeks  PLANNED INTERVENTIONS: [id number]- PT Re-evaluation, 97110-Therapeutic exercises, 97530- Therapeutic activity, [id number]- Neuromuscular re-education, 97535- Self Care, [id number]- Manual therapy, [id number]- Gait training, [id number]-  Aquatic Therapy, Cryotherapy, and Moist heat.  PLAN FOR NEXT SESSION: 30TUG, sec sit to stand,  check back pain and understanding of exs,UE, LE, core strength, aquatics 1x per week in place of 1 clinic therapy if colostomy pouch is waterproof, body mechanics education, CP, MH prn  Grayce JINNY Sheldon, PT 01/23/2024, 6:14 PM

## 2024-01-23 ENCOUNTER — Ambulatory Visit: Attending: Hematology

## 2024-01-23 ENCOUNTER — Other Ambulatory Visit: Payer: Self-pay

## 2024-01-23 DIAGNOSIS — M6281 Muscle weakness (generalized): Secondary | ICD-10-CM | POA: Diagnosis not present

## 2024-01-23 DIAGNOSIS — C189 Malignant neoplasm of colon, unspecified: Secondary | ICD-10-CM | POA: Insufficient documentation

## 2024-01-23 DIAGNOSIS — M5459 Other low back pain: Secondary | ICD-10-CM | POA: Insufficient documentation

## 2024-01-23 DIAGNOSIS — C187 Malignant neoplasm of sigmoid colon: Secondary | ICD-10-CM | POA: Insufficient documentation

## 2024-01-25 ENCOUNTER — Ambulatory Visit: Admitting: Student

## 2024-01-26 ENCOUNTER — Inpatient Hospital Stay: Attending: Hematology

## 2024-01-26 ENCOUNTER — Inpatient Hospital Stay

## 2024-01-26 ENCOUNTER — Inpatient Hospital Stay (HOSPITAL_BASED_OUTPATIENT_CLINIC_OR_DEPARTMENT_OTHER): Admitting: Hematology

## 2024-01-26 ENCOUNTER — Encounter: Payer: Self-pay | Admitting: Hematology

## 2024-01-26 VITALS — BP 130/76 | HR 95 | Temp 98.7°F | Resp 17 | Wt 360.5 lb

## 2024-01-26 DIAGNOSIS — K5720 Diverticulitis of large intestine with perforation and abscess without bleeding: Secondary | ICD-10-CM | POA: Insufficient documentation

## 2024-01-26 DIAGNOSIS — Z1509 Genetic susceptibility to other malignant neoplasm: Secondary | ICD-10-CM | POA: Insufficient documentation

## 2024-01-26 DIAGNOSIS — D3502 Benign neoplasm of left adrenal gland: Secondary | ICD-10-CM | POA: Insufficient documentation

## 2024-01-26 DIAGNOSIS — K760 Fatty (change of) liver, not elsewhere classified: Secondary | ICD-10-CM | POA: Insufficient documentation

## 2024-01-26 DIAGNOSIS — L905 Scar conditions and fibrosis of skin: Secondary | ICD-10-CM | POA: Insufficient documentation

## 2024-01-26 DIAGNOSIS — M2550 Pain in unspecified joint: Secondary | ICD-10-CM | POA: Diagnosis not present

## 2024-01-26 DIAGNOSIS — R61 Generalized hyperhidrosis: Secondary | ICD-10-CM | POA: Insufficient documentation

## 2024-01-26 DIAGNOSIS — Z1502 Genetic susceptibility to malignant neoplasm of ovary: Secondary | ICD-10-CM | POA: Diagnosis not present

## 2024-01-26 DIAGNOSIS — C187 Malignant neoplasm of sigmoid colon: Secondary | ICD-10-CM

## 2024-01-26 DIAGNOSIS — R42 Dizziness and giddiness: Secondary | ICD-10-CM | POA: Insufficient documentation

## 2024-01-26 DIAGNOSIS — K8020 Calculus of gallbladder without cholecystitis without obstruction: Secondary | ICD-10-CM | POA: Diagnosis not present

## 2024-01-26 DIAGNOSIS — Z1501 Genetic susceptibility to malignant neoplasm of breast: Secondary | ICD-10-CM | POA: Diagnosis not present

## 2024-01-26 DIAGNOSIS — Z933 Colostomy status: Secondary | ICD-10-CM | POA: Insufficient documentation

## 2024-01-26 DIAGNOSIS — I2510 Atherosclerotic heart disease of native coronary artery without angina pectoris: Secondary | ICD-10-CM | POA: Diagnosis not present

## 2024-01-26 DIAGNOSIS — D259 Leiomyoma of uterus, unspecified: Secondary | ICD-10-CM | POA: Insufficient documentation

## 2024-01-26 DIAGNOSIS — C7951 Secondary malignant neoplasm of bone: Secondary | ICD-10-CM | POA: Diagnosis not present

## 2024-01-26 DIAGNOSIS — Z452 Encounter for adjustment and management of vascular access device: Secondary | ICD-10-CM

## 2024-01-26 DIAGNOSIS — Z79899 Other long term (current) drug therapy: Secondary | ICD-10-CM | POA: Insufficient documentation

## 2024-01-26 DIAGNOSIS — Z7982 Long term (current) use of aspirin: Secondary | ICD-10-CM | POA: Insufficient documentation

## 2024-01-26 DIAGNOSIS — J90 Pleural effusion, not elsewhere classified: Secondary | ICD-10-CM | POA: Insufficient documentation

## 2024-01-26 DIAGNOSIS — C786 Secondary malignant neoplasm of retroperitoneum and peritoneum: Secondary | ICD-10-CM

## 2024-01-26 DIAGNOSIS — R911 Solitary pulmonary nodule: Secondary | ICD-10-CM | POA: Diagnosis not present

## 2024-01-26 DIAGNOSIS — Z791 Long term (current) use of non-steroidal anti-inflammatories (NSAID): Secondary | ICD-10-CM | POA: Insufficient documentation

## 2024-01-26 DIAGNOSIS — Z5112 Encounter for antineoplastic immunotherapy: Secondary | ICD-10-CM | POA: Insufficient documentation

## 2024-01-26 LAB — CBC WITH DIFFERENTIAL (CANCER CENTER ONLY)
Abs Immature Granulocytes: 0.01 K/uL (ref 0.00–0.07)
Basophils Absolute: 0 K/uL (ref 0.0–0.1)
Basophils Relative: 1 %
Eosinophils Absolute: 0.1 K/uL (ref 0.0–0.5)
Eosinophils Relative: 2 %
HCT: 31.8 % — ABNORMAL LOW (ref 36.0–46.0)
Hemoglobin: 10.2 g/dL — ABNORMAL LOW (ref 12.0–15.0)
Immature Granulocytes: 0 %
Lymphocytes Relative: 26 %
Lymphs Abs: 1.6 K/uL (ref 0.7–4.0)
MCH: 26 pg (ref 26.0–34.0)
MCHC: 32.1 g/dL (ref 30.0–36.0)
MCV: 81.1 fL (ref 80.0–100.0)
Monocytes Absolute: 0.4 K/uL (ref 0.1–1.0)
Monocytes Relative: 6 %
Neutro Abs: 4 K/uL (ref 1.7–7.7)
Neutrophils Relative %: 65 %
Platelet Count: 463 K/uL — ABNORMAL HIGH (ref 150–400)
RBC: 3.92 MIL/uL (ref 3.87–5.11)
RDW: 15.6 % — ABNORMAL HIGH (ref 11.5–15.5)
WBC Count: 6.1 K/uL (ref 4.0–10.5)
nRBC: 0 % (ref 0.0–0.2)

## 2024-01-26 LAB — CMP (CANCER CENTER ONLY)
ALT: 12 U/L (ref 0–44)
AST: 12 U/L — ABNORMAL LOW (ref 15–41)
Albumin: 4.1 g/dL (ref 3.5–5.0)
Alkaline Phosphatase: 54 U/L (ref 38–126)
Anion gap: 8 (ref 5–15)
BUN: 12 mg/dL (ref 6–20)
CO2: 23 mmol/L (ref 22–32)
Calcium: 8.7 mg/dL — ABNORMAL LOW (ref 8.9–10.3)
Chloride: 110 mmol/L (ref 98–111)
Creatinine: 0.62 mg/dL (ref 0.44–1.00)
GFR, Estimated: >60 mL/min (ref >60)
Glucose, Bld: 98 mg/dL (ref 70–99)
Potassium: 3.6 mmol/L (ref 3.5–5.1)
Sodium: 141 mmol/L (ref 135–145)
Total Bilirubin: 0.4 mg/dL (ref 0.0–1.2)
Total Protein: 7.3 g/dL (ref 6.5–8.1)

## 2024-01-26 MED ORDER — SODIUM CHLORIDE 0.9 % IV SOLN: Freq: Once | INTRAVENOUS | Status: AC

## 2024-01-26 MED ORDER — SODIUM CHLORIDE 0.9 % IV SOLN
200.0000 mg | Freq: Once | INTRAVENOUS | Status: AC
  Administered 2024-01-26: 200 mg via INTRAVENOUS
  Filled 2024-01-26: qty 200

## 2024-01-26 MED ORDER — SODIUM CHLORIDE 0.9% FLUSH: 10.0000 mL | INTRAVENOUS | Status: DC | PRN

## 2024-01-26 MED ORDER — SODIUM CHLORIDE 0.9% FLUSH
10.0000 mL | Freq: Once | INTRAVENOUS | Status: AC
Start: 2024-01-26 — End: 2024-01-26
  Administered 2024-01-26: 10 mL

## 2024-01-26 MED ORDER — LIDOCAINE-PRILOCAINE 2.5-2.5 % EX CREA: 1.0000 | TOPICAL_CREAM | CUTANEOUS | 3 refills | Status: AC | PRN

## 2024-01-26 NOTE — Assessment & Plan Note (Signed)
-  MMR loss of [name]6, [name] with high mutation burden, acquired BRCA mutation (+)  --Initially presented with diarrhea 11/04/20, found to have diverticulitis with perforation and also developed an abscess requiring 2 drains, multiple hospital stay  --she started FOLFOX on 06/22/21 but did not respond -she switched to Keytruda  on 09/08/21. She has been tolerating well overall but has developed joint pain which could be related, overall manageable. She is clinically doing better also with more energy and less abdominal pain   -her CEA has normalized on immunotherapy. -CT CAP on 03/15/22 again showed mixed response -she had PET scan on 04/26/2022 for evaluation of her ovarian cysts, and saw GYN Dr. Daisey Dryer, we decided to monitor it. -She tolerating Keytruda  well, with mild joint pain.  She recently started workout at the gym, which has helped her joint pain. -restaging CT from 08/04/2022 showed stable disease in left abdomen and pelvis, no new lesions.  I personally reviewed her scan images, and discussed findings with patient.   -Will continue Keytruda   -repeated CT scan on January 24, 2023 showed continuous response, no new lesions.  -PET on 05/04/2023 showed no definitive evidence of progression, some mildly hypermetabolic nodes, will continue monitoring, plan to stop Keytruda  in 09/2023 if no progression on next scan  -PET 08/29/2023 showed similar tiny left pericolic hypermetabolic nodules, most consistent with metastatic disease, no other definitive evidence of residual disease  -ctDNA GuardantReveal was negative on 10/13/2023

## 2024-01-26 NOTE — Patient Instructions (Signed)

## 2024-01-26 NOTE — Progress Notes (Signed)
 [facility]   [phone] [phone]   Clinic Follow up Note   Patient Care Team: Jarold Medici, MD as PCP - General (Internal Medicine) [name], MD as PCP - Cardiology (Cardiology) [name], [name] as Physician Assistant (Emergency Medicine) Lanny Callander, MD as Consulting Physician (Oncology) Sheldon Standing, MD as Consulting Physician (General Surgery) [name], Dedra, MD as Consulting Physician (Neurology) [name], Krystal, MD as Consulting Physician (Obstetrics and Gynecology) [name], MD as Consulting Physician (Gastroenterology) [name], [name], NP as Nurse Practitioner (Nurse Practitioner)  Date of Service:  01/26/2024  CHIEF COMPLAINT: f/u of metastatic sigmoid colon cancer  CURRENT THERAPY:  Keytruda  200 mg every 3 weeks  Oncology History   Malignant neoplasm of sigmoid colon [facility]) -MMR loss of [name]6, [name] with high mutation burden, acquired BRCA mutation (+)  --Initially presented with diarrhea 11/04/20, found to have diverticulitis with perforation and also developed an abscess requiring 2 drains, multiple hospital stay  --she started FOLFOX on 06/22/21 but did not respond -she switched to Keytruda  on 09/08/21. She has been tolerating well overall but has developed joint pain which could be related, overall manageable. She is clinically doing better also with more energy and less abdominal pain   -her CEA has normalized on immunotherapy. -CT CAP on 03/15/22 again showed mixed response -she had PET scan on 04/26/2022 for evaluation of her ovarian cysts, and saw GYN Dr. [name], we decided to monitor it. -She tolerating Keytruda  well, with mild joint pain.  She recently started workout at the gym, which has helped her joint pain. -restaging CT from 08/04/2022 showed stable disease in left abdomen and pelvis, no new lesions.  I personally reviewed her scan images, and discussed findings with patient.    -Will continue Keytruda   -repeated CT scan on January 24, 2023 showed continuous response, no new lesions.  -PET on 05/04/2023 showed no definitive evidence of progression, some mildly hypermetabolic nodes, will continue monitoring, plan to stop Keytruda  in 09/2023 if no progression on next scan  -PET 08/29/2023 showed similar tiny left pericolic hypermetabolic nodules, most consistent with metastatic disease, no other definitive evidence of residual disease  -ctDNA GuardantReveal was negative on 10/13/2023  Assessment & Plan Metastatic colon cancer on immunotherapy, status post left hemicolectomy with colostomy Currently on pembrolizumab  (Keytruda ) with stable blood counts: WBC normal, hemoglobin 10.2, platelets 463. PET scan and blood test planned next month to evaluate treatment response. If negative, treatment may be discontinued. Potential colostomy reversal discussed, considering scar tissue and previous surgical complications. She is young and expected to tolerate reversal, but scar tissue may pose challenges. Consultation with Dr. Sheldon planned regarding reversal. - Order PET scan in 2-3 weeks - Order blood test next month - Contact Dr. Sheldon regarding potential colostomy reversal - Schedule next treatment cycles for September 11 and October 2  Arthralgia Severe joint pain reported. Undergoing physical therapy twice weekly with consideration for water  therapy. Challenges with waterproofing ostomy for water  therapy noted. - Continue physical therapy twice a week - Explore options for waterproofing ostomy for water  therapy  Vertigo Intermittent vertigo with sensation of room spinning, not associated with position changes or nausea. Blood pressure stable. Possibly related to recent treatment. - Recommend over-the-counter vertigo medication as needed - Advise to sit down if vertigo occurs to prevent falls  Night sweats Night sweats reported, uncommon with current treatment but possible.  Discussion about potential early menopause due to previous chemotherapy. Periods normal but sometimes painful. Stress and  joint pain may contribute to symptoms.  Plan - Lab reviewed, will proceed Keytruda  today and continue every 3 weeks - Repeat PET scan in 2-3 weeks - Repeat cardiac reveal with next lab -f/u in 3 weeks    SUMMARY OF ONCOLOGIC HISTORY: Oncology History Overview Note   Cancer Staging  Malignant neoplasm of sigmoid colon [facility]) Staging form: Colon and Rectum, AJCC 8th Edition - Pathologic stage from 04/15/2021: Stage IIC (pT4b, pN0, cM0) - Signed by Lanny Callander, MD on 06/12/2021    Malignant neoplasm of sigmoid colon [facility])   Initial Diagnosis   Malignant neoplasm of sigmoid colon (HCC)   11/04/2020 Imaging   EXAM: CT ABDOMEN AND PELVIS WITHOUT CONTRAST  IMPRESSION: 1. Perforating descending colonic diverticulitis with multiple abdominopelvic gas and fluid collections, measuring up to 5.7 cm and further described above. 2. Cholelithiasis without findings of acute cholecystitis. 3. 3.6 cm benign left adrenal adenoma. 4. Leiomyomatous uterus. 5. 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   11/07/2020 Imaging   EXAM: CT ABDOMEN AND PELVIS WITHOUT CONTRAST  IMPRESSION: Continued wall thickening is seen involving descending colon suggesting infectious or inflammatory colitis or perforated diverticulitis. There is an adjacent fluid collection measuring 7.0 x 5.0 cm consistent with abscess which is significantly enlarged compared to prior exam. Adjacent to the abscess, there is a severely thickened small bowel loop most consistent with secondary inflammation.   5.2 x 3.5 cm fluid collection is noted within the left psoas muscle consistent with abscess which is significantly enlarged compared to prior exam. 4.4 x 3.8 cm fluid collection consistent with abscess is noted in the  left retroperitoneal region which is also enlarged compared to prior exam.   Fibroid uterus.   Cholelithiasis.   11/27/2020 Imaging   EXAM: CT ABDOMEN AND PELVIS WITHOUT CONTRAST  IMPRESSION: 1. Significantly interval decreased size of the previously visualized left retroperitoneal and left anterior mid abdominal fluid collections. There is persistent fat stranding and mural thickening of the mid descending colon at this level. 2. Unchanged leiomyomatous uterus. 3. Unchanged cholelithiasis.   12/11/2020 Imaging   EXAM: CT ABDOMEN AND PELVIS WITHOUT CONTRAST  IMPRESSION: 1. Stable inflammatory changes centered around the distal descending colon in the left lower abdomen. Pericolonic inflammatory changes have minimally changed since 11/27/2020. Small pocket of gas medial to the colon is probably associated with a fistula or small residual abscess collection. 2. Stable position of the two percutaneous drains. The more anterior drain may be extending through a portion of the small bowel. 3. Cholelithiasis. 4. Fibroid uterus. Cannot exclude an ovarian/adnexal cystic structure near the uterine fundus. 5. Left adrenal adenoma.   01/07/2021 Imaging   EXAM: CT ABDOMEN AND PELVIS WITH CONTRAST  IMPRESSION: 1. No new abscesses identified. Similar degree of soft tissue thickening seen in the left pericolonic region. The degree of persistent soft tissues thickening in the pericolonic space further raises suspicions for malignancy. Further evaluation with colonoscopy should be performed. 2. Left retroperitoneal abscess drain unchanged in position. Anterior left abdominal drain again seen terminating within small bowel loop. 3. 2.6 cm mildly sclerotic lesion noted in the L2 vertebral body. Further evaluation with contrast enhanced lumbar spine MRI should be performed.   02/04/2021 Imaging   EXAM: CT ABDOMEN AND PELVIS WITH CONTRAST  IMPRESSION: No new abdominopelvic collections or  abscess development in the 1 month interval.   Left anterior drain remains within a loop of small  bowel, unchanged.   Left lateral abscess drain remains in the retroperitoneal space adjacent to the iliopsoas muscle with a small amount of residual fluid and air but no measurable collection.   Stable soft tissue prominence and pericolonic strandy edema/inflammation about the left descending colon compatible with residual diverticulitis/colitis. Difficult to exclude underlying transmural lesion.   04/01/2021 Imaging   EXAM: CT ABDOMEN AND PELVIS WITH CONTRAST  IMPRESSION: There appears to be significant enhancement and wall thickening involving the descending colon with some degree of traction and involvement of adjacent small bowel loops. This is consistent with the history of diverticulitis and perforation. Stable position of percutaneous drainage catheter is seen adjacent to left psoas muscle with no significant residual fluid remaining. The other percutaneous drainage catheter that was previously noted to be within small bowel loop on prior exam in the left lower quadrant, has significantly retracted and appears to be outside of the peritoneal space at this time.   Since the prior exam, there does appear to be some degree of rotation involving mesenteric vessels and structures in the right lower quadrant, suggesting partial volvulus or malrotation. There is the interval development of several lymph nodes in this area, most likely inflammatory or reactive in etiology. Mild amount of free fluid is also noted in the pelvis. However, no significant bowel wall thickening or dilatation is seen in this area. These results will be called to the ordering clinician or representative by the Radiologist Assistant, and communication documented in the PACS or zVision Dashboard.   Stable uterine fibroid.   Hepatic steatosis.   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   Cholelithiasis.    04/10/2021 Imaging   EXAM: CT ANGIOGRAPHY CHEST CT ABDOMEN AND PELVIS WITH CONTRAST  IMPRESSION: 1. Again seen are findings compatible with descending colon diverticulitis with perforation. Free air and inflammation have increased in the interval. 2. New lobulated enhancing fluid collection posterior to this segment of inflamed colon measuring 8.5 x 3.5 x 10.0 cm. This collection now invades the adjacent iliopsoas muscle as well as extends through the left lateral abdominal wall. The tip of the drainage catheter is in this collection. Findings are compatible with abscess. 3. Anterior left percutaneous drainage catheter tip has been pulled back and is now within the subcutaneous tissues. 4. Trace free fluid. 5. No pulmonary embolism.  No acute cardiopulmonary process. 6. Cholelithiasis. 7. Fatty infiltration of the liver.   04/15/2021 Definitive Surgery   FINAL MICROSCOPIC DIAGNOSIS:   A. SMALL BOWEL, RESECTION:  - Adenocarcinoma.  - No carcinoma identified in 1 lymph node.   B. PERFORATED LEFT COLON, RESECTION:  - Moderately differentiated colonic adenocarcinoma.  - Tumor extends into pericolonic adipose tissue, and is strongly  suggestive of invasion into small bowel.  See oncology table/comments.  - No carcinoma identified in 4 lymph nodes (0/4).  - Tubular adenoma with high-grade dysplasia, 1.  - Tubular adenomas with low grade dysplasia, 3.   Comments: The size of the tumor is difficult to estimate secondary to the disrupted nature of the specimen, as well as the infiltrative nature of the tumor.  Tumor can be identified as definitely invading into the pericolonic adipose tissue (block B4), but given the extreme disruption of the tissue, and the involvement of the small bowel by what appears to be a colonic adenocarcinoma, I favor perforation of the large bowel with direct invasion into the small bowel.  Accordingly, I believe this is best regarded as a pT4b lesion.    ADDENDUM:  Mismatch Repair Protein (IHC)  SUMMARY INTERPRETATION: ABNORMAL  There is loss of the major MMR protein [name]6: This indicates a high probability that a hereditary germline mutation is present and referral to genetic counseling is warranted. It is recommended that the loss of protein expression be correlated with molecular based microsatellite instability testing.   IHC EXPRESSION RESULTS  TEST           RESULT  MLH1:          Preserved nuclear expression  [name]2:          Preserved nuclear expression  [name]6:          LOSS OF NUCLEAR EXPRESSION  PMS2:          Preserved nuclear expression    04/15/2021 Cancer Staging   Staging form: Colon and Rectum, AJCC 8th Edition - Pathologic stage from 04/15/2021: Stage IIC (pT4b, pN0, cM0) - Signed by Lanny Callander, MD on 06/12/2021 Stage prefix: Initial diagnosis Total positive nodes: 0 Histologic grading system: 4 grade system Histologic grade (G): G2 Residual tumor (R): R0 - None   04/29/2021 Imaging   EXAM: CT ABDOMEN AND PELVIS WITH CONTRAST  IMPRESSION: Continued left retroperitoneal abscesses inferior to the left kidney, involving the left psoas muscle and left abdominal wall musculature. Overall size is decreased since prior study. Interval removal of left lower quadrant abscess drainage catheter.   New fluid collection in the cul-de-sac and wrapping around the uterus concerning for abscess.   Cholelithiasis.   Small left pleural effusion.  Bibasilar atelectasis.   Stable left adrenal adenoma.  ADDENDUM: After discussing the case with Dr. Katha in interventional radiology, it was noted that the fluid in the pelvis originally thought to be in the cul-de-sac is likely within the vagina, best seen on sagittal imaging. Recommend speculum exam.   Also, in the left lateral wall in the area of prior abscess in the lateral wall abdominal musculature, some of the abdomen soft tissue appears to be enhancing. While this could be  infectious, cannot exclude tumor seeding in the left lateral abdominal wall, measuring 6.9 x 3.8 cm on image 64 of series 2.   06/16/2021 Imaging   EXAM: CT ABDOMEN AND PELVIS WITH CONTRAST  IMPRESSION: Increased size of bulky soft tissue masses involving the left psoas muscle and left lateral abdominal wall soft tissues, consistent with progressive metastatic disease.   Significant progression of multiple peritoneal masses throughout the abdomen and pelvis, consistent with peritoneal carcinomatosis.   New mild retroperitoneal lymphadenopathy, consistent with metastatic disease.   Stable uterine fibroid and left adrenal adenoma.   Cholelithiasis, without evidence of cholecystitis.   06/22/2021 - 08/19/2021 Chemotherapy   Patient is on Treatment Plan : COLORECTAL FOLFOX q14d x 6 months     06/22/2021 Tumor Marker   Patient's tumor was tested for the following markers: CEA. Results of the tumor marker test revealed 87.41.   07/10/2021 Pathology Results   FINAL MICROSCOPIC DIAGNOSIS:   A. PERITONEAL MASS, LEFT UPPER ABDOMINAL QUADRANT, BIOPSY:  -  Metastatic adenocarcinoma with necrosis, histologically consistent with colon primary.     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 The report date is 07/16/2021. The remainder of testing was negative/normal.  The CancerNext-Expanded + RNAinsight gene panel offered by [facility] and includes sequencing and rearrangement analysis for the following 77 genes: IP, ALK, APC*, ATM*, AXIN2, BAP1, BARD1, BLM, BMPR1A, BRCA1*, BRCA2*, BRIP1*, CDC73, CDH1*,CDK4, CDKN1B, CDKN2A, CHEK2*, CTNNA1,  DICER1, FANCC, FH, FLCN, GALNT12, KIF1B, LZTR1, MAX, MEN1, MET, MLH1*, [name]2*, [name]3, [name]6*, MUTYH*, NBN, NF1*, NF2, NTHL1, PALB2*, PHOX2B, PMS2*, POT1, PRKAR1A, PTCH1, PTEN*, RAD51C*, RAD51D*,RB1, RECQL, RET, SDHA, SDHAF2, SDHB, SDHC, SDHD, SMAD4, SMARCA4, SMARCB1, SMARCE1, STK11, SUFU,  TMEM127, TP53*,TSC1, TSC2, VHL and XRCC2 (sequencing and deletion/duplication); EGFR, EGLN1, HOXB13, KIT, MITF, PDGFRA, POLD1 and POLE (sequencing only); EPCAM and GREM1 (deletion/duplication only).   08/28/2021 Imaging   EXAM: CT CHEST, ABDOMEN, AND PELVIS WITH CONTRAST  IMPRESSION: 1. Continued interval progression of multiple metastatic soft tissue nodules/masses in the abdomen and pelvis. 2. No evidence for metastatic disease in the chest. 3. Similar appearance of the subtle lesion in the L2 vertebral body, suspicious for metastatic involvement. 4. Cholelithiasis.   09/03/2021 -  Chemotherapy   Patient is on Treatment Plan : COLORECTAL Pembrolizumab  (200) q21d     09/08/2021 - 02/01/2022 Chemotherapy   Patient is on Treatment Plan : COLORECTAL Pembrolizumab  (200) q21d     01/08/2022 Imaging   CT CAP IMPRESSION: 1. Mixed response to therapy. Interval decrease in size of the conglomerate multilobulated mass extending from the left psoas into the left posterior pararenal space through the left lateral abdominal wall along the tract of the previously placed percutaneous drainage catheter. Interval decrease in size of right adnexal mass and omental masses within the right lower quadrant of the abdomen. Interval development of peritoneal carcinomatosis with omental caking within the left lower quadrant of the abdomen and left subdiaphragmatic region adjacent to the spleen and along the left pericolic gutter as well as development of ascites appearing loculated within the anterior peritoneum. 2. Interval development of mild left hydronephrosis secondary to stricturing of the mid left ureter. 3. Stable left adrenal metastasis. 4. Stable sclerotic lesion within the L2 vertebral body. No new lytic or blastic bone lesions are seen. 5. Cholelithiasis. 6. No evidence of intrathoracic metastatic disease. 7. 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 Small fat and fluid  containing parastomal hernia. Moderate colonic stool burden. No evidence of obstruction.   03/15/2022 Imaging   IMPRESSION: 1. Status post sigmoid colon resection with left lower quadrant end colostomy. 2. Multicystic bilateral ovarian lesions are slightly increased in size. 3. Diminished, small volume of loculated appearing ascites throughout the abdomen and pelvis, with similar appearance of peritoneal thickening and nodularity throughout. 4. Heterogeneously calcified, conglomerate masses in the left retroperitoneum and abdominal wall are slightly diminished in size. 5. Left adrenal lesion not significantly changed. 6. Unchanged faintly sclerotic metastatic lesion of L2. 7. Constellation of findings is consistent with mixed response to treatment however with overall little interval change. 8. Minimal residual left hydronephrosis and proximal hydroureter, improved compared to prior examination. 9. Cholelithiasis. 10. Coronary artery disease, significantly advanced for patient age.   08/04/2022 Imaging    IMPRESSION: CT CHEST:   1. No developing mass lesion, fluid collection or lymph node enlargement in the thorax. Stable tiny 3 mm lung nodule. 2. Coronary artery calcifications. Please correlate for other coronary risk factors. 3. Chest port   CT ABDOMEN AND PELVIS:   1. Extensive surgical changes identified. Left lower quadrant ostomy, diverting colostomy. Surgical changes along loops of small bowel in the right hemiabdomen. 2. Once again there are areas of nodular tissue extending along the left lateral abdominal wall, into the retroperitoneum which are similar to previous. The amount of adjacent fluid in these locations has improved. 3. No new mass lesion, fluid collection or lymph node enlargement in the abdomen or pelvis. 4.  Gallstones. 5. Enlarged uterus with fibroids. Bilateral complex cystic areas are once again seen and based on appearance differential includes dilated  fallopian tubes, hydrosalpinx versus true cystic lesions. Please correlate for any known history or prior workup 6. Limited evaluation for solid organ pathology metastatic disease without the advantage of IV contrast     05/04/2023 PET scan   PET - restaging - skull base to thigh  IMPRESSION: 1. Subcentimeter peritoneal nodules in the left abdomen have decreased in size and hypermetabolism from 04/26/2022, compatible with treated metastatic disease. 2. Chronic focal hypermetabolism associated with a gallbladder nodule. Malignancy cannot be excluded. 3. Small hypermetabolic to borderline hypermetabolic external iliac and left inguinal lymph nodes and minimally hypermetabolic small bilateral cervical lymph nodes, nonspecific. Metastatic disease not excluded. Recommend attention on follow-up. 4. Left adrenal adenoma. 5. Cystic right adnexal mass is inadequately characterized. Because this lesion is not adequately characterized, prompt US  is recommended for further evaluation. Note: This recommendation does not apply to premenarchal patients and to those with increased risk (genetic, family history, elevated tumor markers or other high-risk factors) of ovarian cancer. Reference: JACR 2020 Feb; 17(2):248-254 6. 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 No developing mass lesion, fluid collection or lymph node enlargement in the thorax. Stable tiny 3 mm lung nodule. 2. Coronary artery calcifications. Please correlate for other coronary risk factors. 3. Chest port   CT ABDOMEN AND PELVIS:   1. Extensive surgical changes identified. Left lower quadrant ostomy, diverting colostomy. Surgical  changes along loops of small bowel in the right hemiabdomen. 2. Once again there are areas of nodular tissue extending along the left lateral abdominal wall, into the retroperitoneum which are similar to previous. The amount of adjacent fluid in these locations has improved. 3. No new mass lesion, fluid collection or lymph node enlargement in the abdomen or pelvis. 4. Gallstones. 5. Enlarged uterus with fibroids. Bilateral complex cystic areas are once again seen and based on appearance differential includes dilated fallopian tubes, hydrosalpinx versus true cystic lesions. Please correlate for any known history or prior workup 6. Limited evaluation for solid organ pathology metastatic disease without the advantage of IV contrast        Discussed the use of AI scribe software for clinical note transcription with the patient, who gave verbal consent to proceed.  History of Present Illness Mikayla Rios Rios is a 38 year old female with colon cancer who presents for follow-up.  She experiences episodes of lightheadedness that occur randomly, including while sitting. There is a history of vertigo associated with her last treatment, with occasional sensations of the room spinning, particularly when lying down and opening her eyes. No nausea is present, and her blood pressure readings are normal.  She has been on Keytruda  for two years as part of her treatment for colon cancer. She underwent a left colon resection in November 2022, removing approximately 37 centimeters of her colon. She experiences rapid bowel movements post-eating.  She experiences significant joint pain and is undergoing physical therapy twice a week. She is considering water  therapy but needs to address waterproofing her ostomy first. She also experiences night sweats.  Her menstrual cycle is normal  but described as 'extreme' and 'very painful' at times. She expresses concern about potential menopause due to her history of  chemotherapy. She recalls her first surgery being difficult due to blood pressure drops and mentions having significant scar tissue.     All other systems were reviewed with the patient and are negative.  MEDICAL HISTORY:  Past Medical History:  Diagnosis Date   Allergy    seasonal   Anemia    Blood infection 1985   Blood transfusion without reported diagnosis    colon ca 04/2021   Diverticulitis    Family history of breast cancer    Family history of colon cancer    Family history of colon cancer    Family history of stomach cancer    Hypertension    Obesity    Sleep apnea     SURGICAL HISTORY: Past Surgical History:  Procedure Laterality Date   COLECTOMY WITH COLOSTOMY CREATION/HARTMANN PROCEDURE N/A 04/15/2021   Procedure: COLOSTOMY CREATION/HARTMANN PROCEDURE;  Surgeon: Sheldon Standing, MD;  Location: [facility];  Service: General;  Laterality: N/A;   IR CATHETER TUBE CHANGE  12/12/2020   IR CATHETER TUBE CHANGE  01/27/2021   IR CATHETER TUBE CHANGE  02/19/2021   IR CATHETER TUBE CHANGE  04/13/2021   IR IMAGING GUIDED PORT INSERTION  10/02/2021   IR RADIOLOGIST EVAL & MGMT  11/27/2020   IR RADIOLOGIST EVAL & MGMT  12/11/2020   IR RADIOLOGIST EVAL & MGMT  01/07/2021   IR RADIOLOGIST EVAL & MGMT  02/04/2021   IR SINUS/FIST TUBE CHK-NON GI  12/12/2020   IR SINUS/FIST TUBE CHK-NON GI  02/19/2021   LAPAROSCOPIC PARTIAL COLECTOMY N/A 04/15/2021   Procedure: LAPAROSCOPIC ASSISTED HARTMANN RESECTION;  Surgeon: Sheldon Standing, MD;  Location: [facility];  Service: General;  Laterality: N/A;    I have reviewed the social history and family history with the patient and they are unchanged from previous note.  ALLERGIES:  is allergic to chlorhexidine  gluconate, shellfish allergy, and penicillins.  MEDICATIONS:  Current Outpatient Medications  Medication Sig Dispense Refill   acetaminophen -codeine  (TYLENOL  #3) 300-30 MG tablet Take 1 tablet by mouth every 8 (eight) hours as needed for  moderate pain (pain score 4-6). 30 tablet 0   ALPRAZolam  (XANAX ) 0.25 MG tablet Take 1 tablet (0.25 mg total) by mouth at bedtime as needed for anxiety. 30 tablet 0   amLODipine  (NORVASC ) 10 MG tablet TAKE 1 TABLET BY MOUTH EVERY DAY 90 tablet 3   aspirin  EC 81 MG tablet Take 1 tablet (81 mg total) by mouth daily. Swallow whole.     atorvastatin  (LIPITOR) 40 MG tablet Take 1 tablet (40 mg total) by mouth daily. 30 tablet 6   carvedilol  (COREG ) 12.5 MG tablet TAKE 1.5 TABLETS BY MOUTH 2 (TWO) TIMES DAILY. 270 tablet 2   diclofenac  Sodium (VOLTAREN ) 1 % GEL Apply 2 g topically 4 (four) times daily. 100 g 2   dicyclomine  (BENTYL ) 10 MG capsule Take 1 capsule (10 mg total) by mouth every 8 (eight) hours as needed for spasms. 20 capsule 0   furosemide  (LASIX ) 20 MG tablet TAKE 1 TABLET BY MOUTH EVERY DAY AS NEEDED 90 tablet 1   gabapentin  (NEURONTIN ) 300 MG capsule Take 1 capsule (300 mg total) by mouth at bedtime. 90 capsule 1   KLOR-CON  M20 20 MEQ tablet TAKE 1 TABLET BY MOUTH EVERY DAY 90 tablet 1   methocarbamol  (ROBAXIN -750) 750 MG tablet Take 1 tablet (750 mg total) by mouth every 8 (  eight) hours as needed for muscle spasms. 60 tablet 0   prochlorperazine  (COMPAZINE ) 10 MG tablet Take 1 tablet (10 mg total) by mouth every 8 (eight) hours as needed for nausea or vomiting. 30 tablet 0   traZODone  (DESYREL ) 50 MG tablet TAKE 1-2 TABLETS BY MOUTH AT BEDTIME AS NEEDED FOR SLEEP. 180 tablet 1   triamcinolone  cream (KENALOG ) 0.1 % APPLY TO AFFECTED AREA TWICE DAILY AS NEEDED 30 g 0   valsartan  (DIOVAN ) 320 MG tablet Take 1 tablet (320 mg total) by mouth daily. 30 tablet 6   lidocaine -prilocaine  (EMLA ) cream Apply 1 Application topically as needed. 30 g 3   No current facility-administered medications for this visit.   Facility-Administered Medications Ordered in Other Visits  Medication Dose Route Frequency Provider Last Rate Last Admin   fentaNYL  (SUBLIMAZE ) injection   Intravenous PRN Mir, Farhaan  R, MD   50 mcg at 10/02/21 1436   midazolam  (VERSED ) injection   Intravenous PRN Mir, Farhaan R, MD   1 mg at 10/02/21 1436   sodium chloride  flush (NS) 0.9 % injection 10 mL  10 mL Intracatheter PRN Lanny Callander, MD        PHYSICAL EXAMINATION: ECOG PERFORMANCE STATUS: 1 - Symptomatic but completely ambulatory  Vitals:   01/26/24 1407  BP: 130/76  Pulse: 95  Resp: 17  Temp: 98.7 F (37.1 C)  SpO2: 99%   Wt Readings from Last 3 Encounters:  01/26/24 (!) 360 lb 8 oz (163.5 kg)  01/05/24 (!) 358 lb 6.4 oz (162.6 kg)  12/15/23 (!) 356 lb 11.2 oz (161.8 kg)     GENERAL:alert, no distress and comfortable. Obese female  SKIN: skin color, texture, turgor are normal, no rashes or significant lesions EYES: normal, Conjunctiva are pink and non-injected, sclera clear NECK: supple, thyroid  normal size, non-tender, without nodularity LYMPH:  no palpable lymphadenopathy in the cervical, axillary  LUNGS: clear to auscultation and percussion with normal breathing effort HEART: regular rate & rhythm and no murmurs and no lower extremity edema ABDOMEN:abdomen soft, non-tender and normal bowel sounds Musculoskeletal:no cyanosis of digits and no clubbing  NEURO: alert & oriented x 3 with fluent speech, no focal motor/sensory deficits  Physical Exam    LABORATORY DATA:  I have reviewed the data as listed    Latest Ref Rng & Units 01/26/2024    1:41 PM 01/05/2024    1:11 PM 12/15/2023    1:52 PM  CBC  WBC 4.0 - 10.5 K/uL 6.1  6.7  6.5   Hemoglobin 12.0 - 15.0 g/dL 89.7  89.8  89.7   Hematocrit 36.0 - 46.0 % 31.8  31.2  31.6   Platelets 150 - 400 K/uL 463  453  456         Latest Ref Rng & Units 01/26/2024    1:41 PM 01/05/2024    1:11 PM 12/15/2023    1:52 PM  CMP  Glucose 70 - 99 mg/dL 98  92  97   BUN 6 - 20 mg/dL 12  13  14    Creatinine 0.44 - 1.00 mg/dL 9.37  9.32  9.23   Sodium 135 - 145 mmol/L 141  139  140   Potassium 3.5 - 5.1 mmol/L 3.6  3.8  3.7   Chloride 98 - 111 mmol/L  110  107  109   CO2 22 - 32 mmol/L 23  25  25    Calcium  8.9 - 10.3 mg/dL 8.7  8.6  8.8   Total Protein  6.5 - 8.1 g/dL 7.3  7.3  7.3   Total Bilirubin 0.0 - 1.2 mg/dL 0.4  0.4  0.4   Alkaline Phos 38 - 126 U/L 54  50  46   AST 15 - 41 U/L 12  11  14    ALT 0 - 44 U/L 12  13  17        RADIOGRAPHIC STUDIES: I have personally reviewed the radiological images as listed and agreed with the findings in the report. No results found.    Orders Placed This Encounter  Procedures   NM PET Image Restag (PS) Skull Base To Thigh    Standing Status:   Future    Expected Date:   02/09/2024    Expiration Date:   01/25/2025    If indicated for the ordered procedure, I authorize the administration of a radiopharmaceutical per Radiology protocol:   Yes    Is the patient pregnant?:   No    Preferred imaging location?:   [facility]   All questions were answered. The patient knows to call the clinic with any problems, questions or concerns. No barriers to learning was detected. The total time spent in the appointment was 25 minutes, including review of chart and various tests results, discussions about plan of care and coordination of care plan     [name], MD 01/26/2024

## 2024-01-27 LAB — CEA (ACCESS): CEA (CHCC): <1 ng/mL (ref 0.00–5.00)

## 2024-01-30 ENCOUNTER — Telehealth: Payer: Self-pay | Admitting: *Deleted

## 2024-01-30 ENCOUNTER — Other Ambulatory Visit: Payer: Self-pay | Admitting: Hematology

## 2024-01-30 DIAGNOSIS — C187 Malignant neoplasm of sigmoid colon: Secondary | ICD-10-CM

## 2024-01-30 NOTE — Telephone Encounter (Signed)
 01/27/2024 Late entry Medication Prior Authorization Status  Processed CoverMyMeds KEY: [id number] for Lidocaine -Prilocaine  2.5-2.5% cream  Approved Today  Per [facility] [employer]   PA Case ID #: [id number]    Effective 01/27/2024 through  04/26/2024.

## 2024-01-31 ENCOUNTER — Other Ambulatory Visit: Payer: Self-pay

## 2024-02-01 ENCOUNTER — Ambulatory Visit

## 2024-02-01 DIAGNOSIS — M6281 Muscle weakness (generalized): Secondary | ICD-10-CM

## 2024-02-01 DIAGNOSIS — C189 Malignant neoplasm of colon, unspecified: Secondary | ICD-10-CM | POA: Diagnosis not present

## 2024-02-01 DIAGNOSIS — M5459 Other low back pain: Secondary | ICD-10-CM

## 2024-02-01 DIAGNOSIS — C187 Malignant neoplasm of sigmoid colon: Secondary | ICD-10-CM | POA: Diagnosis not present

## 2024-02-01 NOTE — Patient Instructions (Addendum)
 [facility] [phone] HIP: Flexion Standing    Holding onto counter lift leg straight in front keeping knee straight. Slow and controlled! _10__ reps per set, _2-3__ sets per day. Then repeat with other leg.   Hip Extension (Standing)    Stand with support at counter. Squeeze pelvic floor and hold so as not to twist hips and don't lean forward.  Move right leg backward with straight knee. Slow and controlled! Repeat _10__ times. Do _2-3__ times a day. Repeat with other leg.  Hip Abduction (Standing)    Stand with support. Squeeze pelvic floor and hold. Lift right leg out to side, keeping toe forward.  Repeat _10__ times. Do _2-3__ times a day. Repeat with other leg.   Heel Raise: Bilateral (Standing)      Stand near counter for fingertip support if needed. Rise on balls of feet. Repeat __10-20__ times per set. Do _1-2___ sets per session. Do __2__ sessions per day.  SINGLE LIMB STANCE    Stand at counter (corner if you have one) for minimal arm support. Raise leg. Hold _10-20__ seconds. Repeat with other leg. Once this becomes easier, for increased challenge, close eyes with fingertips on counter for safety. _3-5__ reps per set, _2-3__ sets per day.  Tandem Stance    Stand at counter (corner if you have one). Right foot in front of left, heel touching toe both feet straight ahead. Stand on Foot Triangle of Support with both feet. Balance in this position _10-20__ seconds. Do with left foot in front of right. Once this becomes easier, for increased challenge, close eyes with fingertips on counter for safety.  Mini Squat: Double Leg    Standing at counter. With feet shoulder width apart, reach forward for balance and do a mini squat. Keep knees in line with second toe. Knees do not go past toes. Repeat _5__ times per set. Do __2_ sets per session.  PELVIC TILT: Posterior    Tighten abdominals, flatten low back. _10__ reps per set, __1-2_ sets per  day.  Tilt with : Clams  Alternate mini march Pillow squeeze   2-3 sets of 5 reps  Stretches  Low trunk rotation laying on bed Hamstring stretch seated at edge of chair with knee straight and lean forward leading with chest Standing at dining room table, place foot in chair behind you and stand tall for stretch in thigh.  Hold 20-30 sec, 2-3 reps throughout day

## 2024-02-01 NOTE — Therapy (Signed)
 OUTPATIENT PHYSICAL THERAPY THORACOLUMBAR TREATMENT   Patient Name: Mikayla Rios MRN: [id number] DOB:03/20/1986, 38 y.o., female Today's Date: 02/01/2024  END OF SESSION:  PT End of Session - 02/01/24 1614     Visit Number 2    Number of Visits 16    Date for PT Re-Evaluation 03/19/24    Authorization Type None needed    Authorization Time Period 30 visits/year    Authorization - Visit Number 2    Authorization - Number of Visits 30    PT Start Time 1610   pt arrived late   PT Stop Time 1706    PT Time Calculation (min) 56 min    Activity Tolerance Patient tolerated treatment well    Behavior During Therapy WFL for tasks assessed/performed          Past Medical History:  Diagnosis Date   Allergy    seasonal   Anemia    Blood infection 1985   Blood transfusion without reported diagnosis    colon ca 04/2021   Diverticulitis    Family history of breast cancer    Family history of colon cancer    Family history of colon cancer    Family history of stomach cancer    Hypertension    Obesity    Sleep apnea    Past Surgical History:  Procedure Laterality Date   COLECTOMY WITH COLOSTOMY CREATION/HARTMANN PROCEDURE N/A 04/15/2021   Procedure: COLOSTOMY CREATION/HARTMANN PROCEDURE;  Surgeon: Sheldon Standing, MD;  Location: [facility];  Service: General;  Laterality: N/A;   IR CATHETER TUBE CHANGE  12/12/2020   IR CATHETER TUBE CHANGE  01/27/2021   IR CATHETER TUBE CHANGE  02/19/2021   IR CATHETER TUBE CHANGE  04/13/2021   IR IMAGING GUIDED PORT INSERTION  10/02/2021   IR RADIOLOGIST EVAL & MGMT  11/27/2020   IR RADIOLOGIST EVAL & MGMT  12/11/2020   IR RADIOLOGIST EVAL & MGMT  01/07/2021   IR RADIOLOGIST EVAL & MGMT  02/04/2021   IR SINUS/FIST TUBE CHK-NON GI  12/12/2020   IR SINUS/FIST TUBE CHK-NON GI  02/19/2021   LAPAROSCOPIC PARTIAL COLECTOMY N/A 04/15/2021   Procedure: LAPAROSCOPIC ASSISTED HARTMANN RESECTION;  Surgeon: Sheldon Standing, MD;  Location: [facility];  Service:  General;  Laterality: N/A;   Patient Active Problem List   Diagnosis Date Noted   Obesity due to excess calories with body mass index (BMI) 120% of 95th percentile to less than 140% of 95th percentile for age in pediatric patient 11/15/2023   Viral upper respiratory tract infection 08/07/2023   Myalgia 03/29/2023   Chronic midline thoracic back pain 03/29/2023   Macromastia 03/29/2023   Lower extremity edema 03/29/2023   Urinary tract infection without hematuria 12/02/2022   Arthralgia 10/13/2022   Nerve pain 09/20/2022   Insomnia due to medical condition 09/20/2022   Irritant contact dermatitis associated with fecal stoma 07/14/2022   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   Anemia, iron  deficiency 02/22/2022   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) 12/21/2021   Elevated troponin 10/07/2021   H/O colon cancer, stage II 10/07/2021   Fever 10/07/2021   Palliative care patient 08/17/2021   PICC (peripherally inserted central catheter) in place 08/17/2021   Genetic testing 08/04/2021   FAP (familial adenomatous polyposis) 08/04/2021   Family history of colon cancer 06/29/2021   Family history of breast cancer 06/29/2021   Family history of stomach cancer 06/29/2021   Prolonged Q-T interval on ECG    Hypokalemia    Acute blood  loss anemia    Sinus tachycardia    Debility 05/01/2021   Malignant neoplasm of sigmoid colon (HCC)    Diverticulitis of large intestine with abscess 04/10/2021   Essential hypertension 04/10/2021   SIRS (systemic inflammatory response syndrome) (HCC) 04/10/2021   Hypokalemia, inadequate intake 04/10/2021   Reactive thrombocytosis 04/10/2021   Prolonged QT interval 04/10/2021   Diverticulitis of colon with perforation 11/04/2020   Anemia 07/11/2019   Inappropriate sinus tachycardia (HCC) 07/11/2019   Obstructive sleep apnea 11/27/2018   Morbid obesity with body mass index of 60.0-69.9 in adult [facility]) 07/31/2018   Class 3 severe obesity due to excess  calories with serious comorbidity and body mass index (BMI) of 50.0 to 59.9 in adult    SVD (spontaneous vaginal delivery) 12/16/2012    PCP:   REFERRING PROVIDER: Dr [name]  REFERRING DIAG: Back pain, Deconditioning  Rationale for Evaluation and Treatment: Rehabilitation  THERAPY DIAG:  Malignant neoplasm of colon, unspecified part of colon (HCC)  Muscle weakness (generalized)  Other low back pain  ONSET DATE: 2024  SUBJECTIVE:                                                                                                                                                                                           SUBJECTIVE STATEMENT: I had chemo Thursday and then was sitting in my car a lot over the weekend because we visited a friend who was moving 2 hours away so my Rt leg just hasn't been great since then.   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 A CT in 06/2021 revealed metastatic disease in left psoas, abdominal wall and pertitoneal areas. She underwent chemo therapy at that time. She is currently on Keytruda  and her last guardant reveal on 10/13/2023 was negative. She has a LUQ colostomy. She has had some anemia believed to be due to heavy menstrual bleeding. She has ongoing body and back pain with some resulting from immunotherapy.  PAIN:  Are you having pain? Yes: NPRS scale: 5/10 Pain location: across LB, with cramps left hip, legs Pain description: burning in back and sometimes moves into posterior thighs Aggravating factors: sitting too long, standing more than 10 min, walking 10 min Relieving factors: change activities,   PRECAUTIONS: hx of colon cancer with colostomy  RED FLAGS: Bowel or bladder incontinence: Yes: colostomy  Has a waterproof colostomy bag she will try and see how she does. WEIGHT BEARING  RESTRICTIONS: No  FALLS:  Has patient fallen in last 6 months? No  LIVING ENVIRONMENT: Lives with: lives with their spouse and 3 children, 25, 60, and 65 Lives in: House/apartment Stairs: Yes: Internal: 14 steps; on right going up and External: 4 steps; none Has following equipment at home: Quad cane small base, getting a shower bench  OCCUPATION: not working  PLOF: Independent with basic ADLsneeds help with socks, shoes, bathing,  PATIENT GOALS: Decrease, Be able to be more active     OBJECTIVE:  Note: Objective measures were completed at Evaluation unless otherwise noted.  DIAGNOSTIC FINDINGS:  No lytic disease noted with CT abdomen  PATIENT SURVEYS:  Modified Oswestry:  MODIFIED OSWESTRY DISABILITY SCALE  Date: 01/23/2024 Score  Pain intensity 5 =  Pain medication has no effect on my pain.  2. Personal care (washing, dressing, etc.) 3 =  I need help, but I am able to manage most of my personal care.  3. Lifting 2 = Pain prevents me from lifting heavy weights off the floor, activities (eg. sports, dancing). but I can manage if the weights are conveniently positioned (3) Pain prevents me from going out very often. (eg, on a table).  4. Walking 3 =  Pain prevents me from walking more than  mile.  5. Sitting 2 =  Pain prevents me from sitting more than 1 hour.  6. Standing 4 =  Pain prevents me from standing more than 10 minutes.  7. Sleeping 3 =  Even when I take pain medication, I sleep less than 4 hours.  8. Social Life 3 =  Pain prevents me from going out very often.  9. Traveling 2 =  My pain restricts my travel over 2 hours.  10. Employment/ Homemaking 3 = Pain prevents me from doing anything but light duties.  Total 30/50 =60%   Interpretation of scores: Score Category Description  0-20% Minimal Disability The patient can cope with most living activities. Usually no treatment is indicated apart from advice on lifting, sitting and exercise  21-40% Moderate Disability  The patient experiences more pain and difficulty with sitting, lifting and standing. Travel and social life are more difficult and they may be disabled from work. Personal care, sexual activity and sleeping are not grossly affected, and the patient can usually be managed by conservative means  41-60% Severe Disability Pain remains the main problem in this group, but activities of daily living are affected. These patients require a detailed investigation  61-80% Crippled Back pain impinges on all aspects of the patient's life. Positive intervention is required  81-100% Bed-bound  These patients are either bed-bound or exaggerating their symptoms  Bluford FORBES Zoe DELENA Karon DELENA, et al. Surgery versus conservative management of stable thoracolumbar fracture: the PRESTO feasibility RCT. [place]): [employer]; 2021 Nov. [facility] Technology Assessment, No. 25.62.) Appendix 3, Oswestry Disability Index category descriptors. Available from: [employer]  Minimally Clinically Important Difference (MCID) = 12.8%    LEFS: 7/80  COGNITION: Overall cognitive status: Within functional limits for tasks assessed     SENSATION: CIPN fingertips, toes  MUSCLE LENGTH: Hamstrings: Right 70 deg; Left 55 deg Thomas test: Right - ; Left pain in back with left leg extended and Right knee to chest FABER; Felt good on back both legs done separately, mildly restricted bilaterally  POSTURE: rounded shoulders, forward head, increased lumbar lordosis, and anterior pelvic tilt, bilateral genu valgus  PALPATION: Right iliac high  LUMBAR ROM:   AROM eval  Flexion  Limited  by body habitus Can reach hands several inches Above knees worse  Extension WFL better  Right lateral flexion Increased NW, reach to 1-2 inches above knee  Left lateral flexion Better, reach 1-2 inches above knee  Right rotation   Left rotation    (Blank rows = not tested)  LOWER EXTREMITY   MMT    STRENGTH  Right eval Left eval  Hip flexion 3+, trembles 4-  Hip extension    Hip abduction Seated 4/5 4+  Hip adduction    Hip internal rotation 4 4  Hip external rotation 4+ 4+  Knee flexion 5 5  Knee extension 4- 4  Ankle dorsiflexion 4+ 4+  Ankle plantarflexion    Ankle inversion    Ankle eversion     (Blank rows = not tested)     (Blank rows = not tested)  LUMBAR SPECIAL TESTS:  Straight leg raise test: Negative and FABER test: Negative  FUNCTIONAL TESTS:  30 second sit to stand;    TREATMENT DATE:  02/01/24: Therapeutic Exercises NuStep Resistance 6, LE 7/ UE 6 x 10:30 mins with 635 steps Seated EOB for bil HS stretch x 2 reps, 20 sec holds  Standing at // bars and placed foot in chair behind her for hip flexor stretch x 1 each 45-60 sec with VC's during for proper technique for most effective stretch  Therapeutic Activities 30 sec sit - stand 11 reps In // bars for following: Bil hip 3 way raises into flex, abd and ext x 10 each returning therapist demo Bil heel raises x 10 with VC's  Bil tandem stance x 30 sec each  Bil SLS x 30 sec each, VC's throughout all to relax shoulders and rely on UE support as little as possible Supine on mat table for core strength: Posterior pelvic tilt x 12 reps total, 5 sec holds returning therapist demo, then tilt with following: clams 3 x 5, alt mini march 2 x 5 with LTR stretches between sets due to feeling like back was going to spasm, and then purple ball squeeze 3 x 5     01/23/2024 Discussed POC, LOS, treatment interventions. Pt. would benefit from the pool and pt will check to see if her waterproof colostomy bags work so she can try aquatics. Pts pain improved with BB and was worse with FB. Educated how to check pain in standing, perform BB and recheck pain. Discussed stopping if pain is increased or may continue if pain is overall decreased. Showed how to position hands to support back or use a towel to support back.  Educated in use of a lumbar roll for sitting if they have a proper chair. Scheduled in clinic 2x/week but will make 1 aquatic each week if waterproof pouch works for colostomy.  PATIENT EDUCATION:  Education details: Bil LE strength, flexibility, balance, and core strength  Person educated: Patient and Spouse Education method: Explanation, Demonstration, and Handouts Education comprehension: verbalized understanding and returned demonstration, tactile and VC's during and will benefit from review to assess technique  HOME EXERCISE PROGRAM: Standing BB, checking pain before and after and stopping if pain is increased Towel roll for sitting posture 02/01/24 - Bil LE strength, flexibility, and core strength  ASSESSMENT:  CLINICAL IMPRESSION: Began working on bil LE strength, balance and core strength and initiated an HEP based on exs done in clinic today. Pt reports her LBP was some improved at end of session. She did well with all exs reporting little to no increased LBP. Did notice some Rt>Lt LE weakness during standing activities but was able to complete them and was instructed to modify prn while performing these at home. Also instructed her to do these as she could tolerate at ome without causing an increase in pain. Pt able to verbalize good understanding. Discussed the waterproof covering for her colostomy that her doctor gave her. She has showered with this but yet to try submerging in tub. She plans to do this soon.   OBJECTIVE IMPAIRMENTS: Abnormal gait, decreased activity tolerance, decreased balance, decreased knowledge of condition, decreased mobility, difficulty walking, decreased ROM, decreased strength, increased muscle spasms, improper body mechanics, postural dysfunction, obesity, and pain.   ACTIVITY LIMITATIONS: lifting, bending, sitting, standing,  squatting, sleeping, stairs, bed mobility, bathing, dressing, locomotion level, and caring for others  PARTICIPATION LIMITATIONS: meal prep, cleaning, laundry, shopping, and community activity  PERSONAL FACTORS: Past/current experiences and 3+ comorbidities: colon cancer, chemotherapy, obesity are also affecting patient's functional outcome.   REHAB POTENTIAL: Good  CLINICAL DECISION MAKING: Evolving/moderate complexity  EVALUATION COMPLEXITY: Moderate   GOALS: Goals reviewed with patient? Yes  SHORT TERM GOALS: Target date: 02/20/2024  Pt will be independent in a HEP for ROM, UE and LE and core strength Baseline: Goal status: INITIAL  2.  Pt will have decreased Back pain by 25% for improved tolerance for home activities Baseline:  Goal status: INITIAL  3.  Pt will be able to walk 10-15 min with several rest periods with min LBP Baseline:  Goal status: INITIAL  4.  Pt will be tolerant of aquatic therapy as indicated (has waterproof pouch that works) Baseline:  Goal status: INITIAL   LONG TERM GOALS: Target date: 03/19/2024 Pt will have decreased back pain by 50% or greater for improved ability to tolerate home activities Baseline:  Goal status: INITIAL  2.  ODI will be no greater than 30%  to demonstrate improved function Baseline: 60% Goal status: INITIAL  3.  Pt will be able to walk 8/10 of a mile with rest periods as done previously with minimal LBP Baseline:  Goal status: INITIAL  4.  Pt will be able to stand long enough to fix small meal without LBP Baseline:  Goal status: INITIAL  5.  Pt will be educated in proper body mechanics for home activities Baseline:  Goal status: INITIAL  6.  Pt will be able to go up stairs with alternate gait pattern Baseline:  Goal status: INITIAL  PLAN:  PT FREQUENCY: 2x/week  PT DURATION: 8 weeks  PLANNED INTERVENTIONS: 97164- PT Re-evaluation, 97110-Therapeutic exercises, 97530- Therapeutic activity, [id number]-  Neuromuscular re-education, 97535- Self Care, [id number]- Manual therapy, [id number]- Gait training, [id number]- Aquatic Therapy, Cryotherapy, and Moist heat.  PLAN FOR NEXT SESSION: Do TUG, review HEP, check back pain and understanding  of exs, UE, LE, core strength, aquatics 1x per week in place of 1 clinic therapy if colostomy pouch is waterproof, body mechanics education, CP, MH prn   Aden Berwyn Caldron, PTA 02/01/2024, 5:14 PM          Cancer Rehab [phone] HIP: Flexion Standing    Holding onto counter lift leg straight in front keeping knee straight. Slow and controlled! _10__ reps per set, _2-3__ sets per day. Then repeat with other leg.   Hip Extension (Standing)    Stand with support at counter. Squeeze pelvic floor and hold so as not to twist hips and don't lean forward.  Move right leg backward with straight knee. Slow and controlled! Repeat _10__ times. Do _2-3__ times a day. Repeat with other leg.  Hip Abduction (Standing)    Stand with support. Squeeze pelvic floor and hold. Lift right leg out to side, keeping toe forward.  Repeat _10__ times. Do _2-3__ times a day. Repeat with other leg.   Heel Raise: Bilateral (Standing)      Stand near counter for fingertip support if needed. Rise on balls of feet. Repeat __10-20__ times per set. Do _1-2___ sets per session. Do __2__ sessions per day.  SINGLE LIMB STANCE    Stand at counter (corner if you have one) for minimal arm support. Raise leg. Hold _10-20__ seconds. Repeat with other leg. Once this becomes easier, for increased challenge, close eyes with fingertips on counter for safety. _3-5__ reps per set, _2-3__ sets per day.  Tandem Stance    Stand at counter (corner if you have one). Right foot in front of left, heel touching toe both feet straight ahead. Stand on Foot Triangle of Support with both feet. Balance in this position _10-20__ seconds. Do with left foot in front of right. Once this becomes easier, for  increased challenge, close eyes with fingertips on counter for safety.  Mini Squat: Double Leg    Standing at counter. With feet shoulder width apart, reach forward for balance and do a mini squat. Keep knees in line with second toe. Knees do not go past toes. Repeat _5__ times per set. Do __2_ sets per session.  PELVIC TILT: Posterior    Tighten abdominals, flatten low back. _10__ reps per set, __1-2_ sets per day.  Tilt with : Clams  Alternate mini march Pillow squeeze   2-3 sets of 5 reps  Stretches  Low trunk rotation laying on bed Hamstring stretch seated at edge of chair with knee straight and lean forward leading with chest Standing at dining room table, place foot in chair behind you and stand tall for stretch in thigh.  Hold 20-30 sec, 2-3 reps throughout day

## 2024-02-07 ENCOUNTER — Ambulatory Visit: Attending: Hematology | Admitting: Rehabilitation

## 2024-02-07 ENCOUNTER — Encounter: Payer: Self-pay | Admitting: Hematology

## 2024-02-07 ENCOUNTER — Encounter: Payer: Self-pay | Admitting: Rehabilitation

## 2024-02-07 DIAGNOSIS — C189 Malignant neoplasm of colon, unspecified: Secondary | ICD-10-CM | POA: Diagnosis not present

## 2024-02-07 DIAGNOSIS — M6281 Muscle weakness (generalized): Secondary | ICD-10-CM | POA: Diagnosis not present

## 2024-02-07 DIAGNOSIS — M5459 Other low back pain: Secondary | ICD-10-CM | POA: Diagnosis not present

## 2024-02-07 NOTE — Therapy (Signed)
 OUTPATIENT PHYSICAL THERAPY THORACOLUMBAR TREATMENT   Patient Name: Mikayla Rios MRN: [id number] DOB:March 28, 1986, 38 y.o., female Today's Date: 02/07/2024  END OF SESSION:  PT End of Session - 02/07/24 1713     Visit Number 3    Number of Visits 16    Date for PT Re-Evaluation 03/19/24    PT Start Time 1607    PT Stop Time 1700    PT Time Calculation (min) 53 min    Activity Tolerance Patient tolerated treatment well    Behavior During Therapy WFL for tasks assessed/performed           Past Medical History:  Diagnosis Date   Allergy    seasonal   Anemia    Blood infection 1985   Blood transfusion without reported diagnosis    colon ca 04/2021   Diverticulitis    Family history of breast cancer    Family history of colon cancer    Family history of colon cancer    Family history of stomach cancer    Hypertension    Obesity    Sleep apnea    Past Surgical History:  Procedure Laterality Date   COLECTOMY WITH COLOSTOMY CREATION/HARTMANN PROCEDURE N/A 04/15/2021   Procedure: COLOSTOMY CREATION/HARTMANN PROCEDURE;  Surgeon: Sheldon Standing, MD;  Location: [facility];  Service: General;  Laterality: N/A;   IR CATHETER TUBE CHANGE  12/12/2020   IR CATHETER TUBE CHANGE  01/27/2021   IR CATHETER TUBE CHANGE  02/19/2021   IR CATHETER TUBE CHANGE  04/13/2021   IR IMAGING GUIDED PORT INSERTION  10/02/2021   IR RADIOLOGIST EVAL & MGMT  11/27/2020   IR RADIOLOGIST EVAL & MGMT  12/11/2020   IR RADIOLOGIST EVAL & MGMT  01/07/2021   IR RADIOLOGIST EVAL & MGMT  02/04/2021   IR SINUS/FIST TUBE CHK-NON GI  12/12/2020   IR SINUS/FIST TUBE CHK-NON GI  02/19/2021   LAPAROSCOPIC PARTIAL COLECTOMY N/A 04/15/2021   Procedure: LAPAROSCOPIC ASSISTED HARTMANN RESECTION;  Surgeon: Sheldon Standing, MD;  Location: [facility];  Service: General;  Laterality: N/A;   Patient Active Problem List   Diagnosis Date Noted   Obesity due to excess calories with body mass index (BMI) 120% of 95th percentile to less  than 140% of 95th percentile for age in pediatric patient 11/15/2023   Viral upper respiratory tract infection 08/07/2023   Myalgia 03/29/2023   Chronic midline thoracic back pain 03/29/2023   Macromastia 03/29/2023   Lower extremity edema 03/29/2023   Urinary tract infection without hematuria 12/02/2022   Arthralgia 10/13/2022   Nerve pain 09/20/2022   Insomnia due to medical condition 09/20/2022   Irritant contact dermatitis associated with fecal stoma 07/14/2022   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   Anemia, iron  deficiency 02/22/2022   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) 12/21/2021   Elevated troponin 10/07/2021   H/O colon cancer, stage II 10/07/2021   Fever 10/07/2021   Palliative care patient 08/17/2021   PICC (peripherally inserted central catheter) in place 08/17/2021   Genetic testing 08/04/2021   FAP (familial adenomatous polyposis) 08/04/2021   Family history of colon cancer 06/29/2021   Family history of breast cancer 06/29/2021   Family history of stomach cancer 06/29/2021   Prolonged Q-T interval on ECG    Hypokalemia    Acute blood loss anemia    Sinus tachycardia    Debility 05/01/2021   Malignant neoplasm of sigmoid colon (HCC)    Diverticulitis of large intestine with abscess 04/10/2021   Essential hypertension 04/10/2021  SIRS (systemic inflammatory response syndrome) (HCC) 04/10/2021   Hypokalemia, inadequate intake 04/10/2021   Reactive thrombocytosis 04/10/2021   Prolonged QT interval 04/10/2021   Diverticulitis of colon with perforation 11/04/2020   Anemia 07/11/2019   Inappropriate sinus tachycardia (HCC) 07/11/2019   Obstructive sleep apnea 11/27/2018   Morbid obesity with body mass index of 60.0-69.9 in adult [facility]) 07/31/2018   Class 3 severe obesity due to excess calories with serious comorbidity and body mass index (BMI) of 50.0 to 59.9 in adult    SVD (spontaneous vaginal delivery) 12/16/2012    REFERRING PROVIDER: Dr [name]  REFERRING DIAG: Back pain, Deconditioning  Rationale for Evaluation and Treatment: Rehabilitation  THERAPY DIAG:  Malignant neoplasm of colon, unspecified part of colon (HCC)  Muscle weakness (generalized)  Other low back pain  ONSET DATE: 2024  SUBJECTIVE:                                                                                                                                                                                           SUBJECTIVE STATEMENT: I am very sore today.  From the weekend.  I didn't try to submerge the bag. I am just nervous that the tape will make it loose.  Let her know that she has the okay from her MD.    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 A CT in 06/2021 revealed metastatic disease in left psoas, abdominal wall and pertitoneal areas. She underwent chemo therapy at that time. She is currently on Keytruda  and her last guardant reveal on 10/13/2023 was negative. She has a LUQ colostomy. She has had some anemia believed to be due to heavy menstrual bleeding. She has ongoing body and back pain with some resulting from immunotherapy.  PAIN:  Are you having pain? Yes: NPRS scale: 5/10 Pain location: across LB, with cramps left hip, legs Pain description: burning in back and sometimes moves into posterior thighs Aggravating factors: sitting too long, standing more than 10 min, walking 10 min Relieving factors: change activities,   PRECAUTIONS: hx of colon cancer with colostomy  RED FLAGS: Bowel or bladder incontinence: Yes: colostomy  Has a waterproof colostomy bag she will try and see how she does. WEIGHT BEARING RESTRICTIONS: No  FALLS:  Has patient fallen in last 6 months? No  LIVING ENVIRONMENT: Lives with: lives with their spouse and 3 children, 54, 39, and 64 Lives in:  House/apartment Stairs: Yes: Internal: 14 steps; on right  going up and External: 4 steps; none Has following equipment at home: Quad cane small base, getting a shower bench  OCCUPATION: not working  PLOF: Independent with basic ADLsneeds help with socks, shoes, bathing,  PATIENT GOALS: Decrease, Be able to be more active     OBJECTIVE:  Note: Objective measures were completed at Evaluation unless otherwise noted.  DIAGNOSTIC FINDINGS:  No lytic disease noted with CT abdomen  PATIENT SURVEYS:  Modified Oswestry:  MODIFIED OSWESTRY DISABILITY SCALE  Date: 01/23/2024 Score  Pain intensity 5 =  Pain medication has no effect on my pain.  2. Personal care (washing, dressing, etc.) 3 =  I need help, but I am able to manage most of my personal care.  3. Lifting 2 = Pain prevents me from lifting heavy weights off the floor, activities (eg. sports, dancing). but I can manage if the weights are conveniently positioned (3) Pain prevents me from going out very often. (eg, on a table).  4. Walking 3 =  Pain prevents me from walking more than  mile.  5. Sitting 2 =  Pain prevents me from sitting more than 1 hour.  6. Standing 4 =  Pain prevents me from standing more than 10 minutes.  7. Sleeping 3 =  Even when I take pain medication, I sleep less than 4 hours.  8. Social Life 3 =  Pain prevents me from going out very often.  9. Traveling 2 =  My pain restricts my travel over 2 hours.  10. Employment/ Homemaking 3 = Pain prevents me from doing anything but light duties.  Total 30/50 =60%   Interpretation of scores: Score Category Description  0-20% Minimal Disability The patient can cope with most living activities. Usually no treatment is indicated apart from advice on lifting, sitting and exercise  21-40% Moderate Disability The patient experiences more pain and difficulty with sitting, lifting and standing. Travel and social life are more difficult and they may be disabled from work.  Personal care, sexual activity and sleeping are not grossly affected, and the patient can usually be managed by conservative means  41-60% Severe Disability Pain remains the main problem in this group, but activities of daily living are affected. These patients require a detailed investigation  61-80% Crippled Back pain impinges on all aspects of the patient's life. Positive intervention is required  81-100% Bed-bound  These patients are either bed-bound or exaggerating their symptoms  Bluford FORBES Zoe DELENA Karon DELENA, et al. Surgery versus conservative management of stable thoracolumbar fracture: the PRESTO feasibility RCT. [place] ([place]): [employer]; 2021 Nov. [facility] Technology Assessment, No. 25.62.) Appendix 3, Oswestry Disability Index category descriptors. Available from: [employer]cz  Minimally Clinically Important Difference (MCID) = 12.8%    LEFS: 7/80  COGNITION: Overall cognitive status: Within functional limits for tasks assessed     SENSATION: CIPN fingertips, toes  MUSCLE LENGTH: Hamstrings: Right 70 deg; Left 55 deg Thomas test: Right - ; Left pain in back with left leg extended and Right knee to chest FABER; Felt good on back both legs done separately, mildly restricted bilaterally  POSTURE: rounded shoulders, forward head, increased lumbar lordosis, and anterior pelvic tilt, bilateral genu valgus  PALPATION: Right iliac high  LUMBAR ROM:   AROM eval  Flexion  Limited by body habitus Can reach hands several inches Above knees worse  Extension WFL better  Right lateral flexion Increased NW, reach to 1-2 inches above knee  Left lateral flexion Better, reach 1-2 inches above  knee  Right rotation   Left rotation    (Blank rows = not tested)  LOWER EXTREMITY  MMT    STRENGTH  Right eval Left eval  Hip flexion 3+, trembles 4-  Hip extension    Hip abduction Seated 4/5 4+  Hip adduction    Hip internal rotation 4  4  Hip external rotation 4+ 4+  Knee flexion 5 5  Knee extension 4- 4  Ankle dorsiflexion 4+ 4+  Ankle plantarflexion    Ankle inversion    Ankle eversion     (Blank rows = not tested)     (Blank rows = not tested)  LUMBAR SPECIAL TESTS:  Straight leg raise test: Negative and FABER test: Negative  FUNCTIONAL TESTS:  30 second sit to stand;    TREATMENT DATE:  02/07/24: NuStep Resistance 6, LE 7/ UE 6 x 10 mins with 635 steps Reviewed HEP with performance of each:   LTR 3 x 6 bil   PPT/TrA with reminder of cueing x 10   With ball squeeze 4 x 10    With mini march x 6bil   Seated EOB for bil HS stretch x 2 reps, 20 sec holds with foot on 6 step  Bil heel raises x 10 with VC's Mini squat x 10   Bil tandem stance x 30 sec each  Bil SLS x 30 sec each, VC's throughout all to relax shoulders and rely on UE support as little as possible  Large ball roll out at the middle, left, and right 2x20  Mermaid stretch x 2 bil  02/01/24: Therapeutic Exercises NuStep Resistance 6, LE 7/ UE 6 x 10:30 mins with 635 steps Seated EOB for bil HS stretch x 2 reps, 20 sec holds  Standing at // bars and placed foot in chair behind her for hip flexor stretch x 1 each 45-60 sec with VC's during for proper technique for most effective stretch  Therapeutic Activities 30 sec sit - stand 11 reps In // bars for following: Bil hip 3 way raises into flex, abd and ext x 10 each returning therapist demo Bil heel raises x 10 with VC's  Bil tandem stance x 30 sec each  Bil SLS x 30 sec each, VC's throughout all to relax shoulders and rely on UE support as little as possible Supine on mat table for core strength: Posterior pelvic tilt x 12 reps total, 5 sec holds returning therapist demo, then tilt with following: clams 3 x 5, alt mini march 2 x 5 with LTR stretches between sets due to feeling like back was going to spasm, and then purple ball squeeze 3 x 5   01/23/2024 Discussed POC, LOS, treatment  interventions. Pt. would benefit from the pool and pt will check to see if her waterproof colostomy bags work so she can try aquatics. Pts pain improved with BB and was worse with FB. Educated how to check pain in standing, perform BB and recheck pain. Discussed stopping if pain is increased or may continue if pain is overall decreased. Showed how to position hands to support back or use a towel to support back. Educated in use of a lumbar roll for sitting if they have a proper chair. Scheduled in clinic 2x/week but will make 1 aquatic each week if waterproof pouch works for colostomy.  PATIENT EDUCATION:  Education details: Bil LE strength, flexibility, balance, and core strength  Person educated: Patient and Spouse Education method: Explanation, Demonstration, and Handouts Education comprehension: verbalized understanding and returned demonstration, tactile and VC's during and will benefit from review to assess technique  HOME EXERCISE PROGRAM: Standing BB, checking pain before and after and stopping if pain is increased Towel roll for sitting posture 02/01/24 - Bil LE strength, flexibility, and core strength  ASSESSMENT:  CLINICAL IMPRESSION: Continued POC.  Pt reports doing well after first session and not too sore.  Discussed how large incision and surgery at the abdomen impacts her core strength. She is still interested in trying the water  therapy but is very nervous about the tape of the bag getting loose.    OBJECTIVE IMPAIRMENTS: Abnormal gait, decreased activity tolerance, decreased balance, decreased knowledge of condition, decreased mobility, difficulty walking, decreased ROM, decreased strength, increased muscle spasms, improper body mechanics, postural dysfunction, obesity, and pain.   ACTIVITY LIMITATIONS: lifting, bending, sitting, standing, squatting,  sleeping, stairs, bed mobility, bathing, dressing, locomotion level, and caring for others  PARTICIPATION LIMITATIONS: meal prep, cleaning, laundry, shopping, and community activity  PERSONAL FACTORS: Past/current experiences and 3+ comorbidities: colon cancer, chemotherapy, obesity are also affecting patient's functional outcome.   REHAB POTENTIAL: Good  CLINICAL DECISION MAKING: Evolving/moderate complexity  EVALUATION COMPLEXITY: Moderate   GOALS: Goals reviewed with patient? Yes  SHORT TERM GOALS: Target date: 02/20/2024  Pt will be independent in a HEP for ROM, UE and LE and core strength Baseline: Goal status: INITIAL  2.  Pt will have decreased Back pain by 25% for improved tolerance for home activities Baseline:  Goal status: INITIAL  3.  Pt will be able to walk 10-15 min with several rest periods with min LBP Baseline:  Goal status: INITIAL  4.  Pt will be tolerant of aquatic therapy as indicated (has waterproof pouch that works) Baseline:  Goal status: INITIAL   LONG TERM GOALS: Target date: 03/19/2024 Pt will have decreased back pain by 50% or greater for improved ability to tolerate home activities Baseline:  Goal status: INITIAL  2.  ODI will be no greater than 30%  to demonstrate improved function Baseline: 60% Goal status: INITIAL  3.  Pt will be able to walk 8/10 of a mile with rest periods as done previously with minimal LBP Baseline:  Goal status: INITIAL  4.  Pt will be able to stand long enough to fix small meal without LBP Baseline:  Goal status: INITIAL  5.  Pt will be educated in proper body mechanics for home activities Baseline:  Goal status: INITIAL  6.  Pt will be able to go up stairs with alternate gait pattern Baseline:  Goal status: INITIAL  PLAN:  PT FREQUENCY: 2x/week  PT DURATION: 8 weeks  PLANNED INTERVENTIONS: 97164- PT Re-evaluation, 97110-Therapeutic exercises, 97530- Therapeutic activity, [id number]- Neuromuscular  re-education, 97535- Self Care, [id number]- Manual therapy, [id number]- Gait training, [id number]- Aquatic Therapy, Cryotherapy, and Moist heat.  PLAN FOR NEXT SESSION: Do TUG, review HEP, check back pain and understanding of exs, UE, LE, core strength, aquatics 1x per week in place of 1 clinic therapy if colostomy pouch is waterproof, body mechanics education, CP, MH prn   Levern Kalka, Saddie SAUNDERS, PT 02/07/2024, 5:15 PM          [facility] [phone] HIP: Flexion Standing    Holding onto counter lift leg straight in front keeping knee straight. Slow and controlled! _10__ reps per set, _2-3__ sets  per day. Then repeat with other leg.   Hip Extension (Standing)    Stand with support at counter. Squeeze pelvic floor and hold so as not to twist hips and don't lean forward.  Move right leg backward with straight knee. Slow and controlled! Repeat _10__ times. Do _2-3__ times a day. Repeat with other leg.  Hip Abduction (Standing)    Stand with support. Squeeze pelvic floor and hold. Lift right leg out to side, keeping toe forward.  Repeat _10__ times. Do _2-3__ times a day. Repeat with other leg.   Heel Raise: Bilateral (Standing)      Stand near counter for fingertip support if needed. Rise on balls of feet. Repeat __10-20__ times per set. Do _1-2___ sets per session. Do __2__ sessions per day.  SINGLE LIMB STANCE    Stand at counter (corner if you have one) for minimal arm support. Raise leg. Hold _10-20__ seconds. Repeat with other leg. Once this becomes easier, for increased challenge, close eyes with fingertips on counter for safety. _3-5__ reps per set, _2-3__ sets per day.  Tandem Stance    Stand at counter (corner if you have one). Right foot in front of left, heel touching toe both feet straight ahead. Stand on Foot Triangle of Support with both feet. Balance in this position _10-20__ seconds. Do with left foot in front of right. Once this becomes easier, for increased challenge, close  eyes with fingertips on counter for safety.  Mini Squat: Double Leg    Standing at counter. With feet shoulder width apart, reach forward for balance and do a mini squat. Keep knees in line with second toe. Knees do not go past toes. Repeat _5__ times per set. Do __2_ sets per session.  PELVIC TILT: Posterior    Tighten abdominals, flatten low back. _10__ reps per set, __1-2_ sets per day.  Tilt with : Clams  Alternate mini march Pillow squeeze   2-3 sets of 5 reps  Stretches  Low trunk rotation laying on bed Hamstring stretch seated at edge of chair with knee straight and lean forward leading with chest Standing at dining room table, place foot in chair behind you and stand tall for stretch in thigh.  Hold 20-30 sec, 2-3 reps throughout day

## 2024-02-09 ENCOUNTER — Ambulatory Visit (HOSPITAL_COMMUNITY)

## 2024-02-09 ENCOUNTER — Ambulatory Visit: Admitting: Rehabilitation

## 2024-02-09 ENCOUNTER — Encounter: Payer: Self-pay | Admitting: Rehabilitation

## 2024-02-09 DIAGNOSIS — M6281 Muscle weakness (generalized): Secondary | ICD-10-CM

## 2024-02-09 DIAGNOSIS — M5459 Other low back pain: Secondary | ICD-10-CM | POA: Diagnosis not present

## 2024-02-09 DIAGNOSIS — C189 Malignant neoplasm of colon, unspecified: Secondary | ICD-10-CM

## 2024-02-09 NOTE — Therapy (Signed)
 OUTPATIENT PHYSICAL THERAPY THORACOLUMBAR TREATMENT   Patient Name: Mikayla Rios MRN: [id number] DOB:Jan 28, 1986, 38 y.o., female Today's Date: 02/09/2024  END OF SESSION:  PT End of Session - 02/09/24 1608     Visit Number 4    Number of Visits 16    Date for PT Re-Evaluation 03/19/24    Authorization - Visit Number 4    Authorization - Number of Visits 30    PT Start Time 1609    PT Stop Time 1650    PT Time Calculation (min) 41 min    Activity Tolerance Patient tolerated treatment well    Behavior During Therapy WFL for tasks assessed/performed           Past Medical History:  Diagnosis Date   Allergy    seasonal   Anemia    Blood infection 1985   Blood transfusion without reported diagnosis    colon ca 04/2021   Diverticulitis    Family history of breast cancer    Family history of colon cancer    Family history of colon cancer    Family history of stomach cancer    Hypertension    Obesity    Sleep apnea    Past Surgical History:  Procedure Laterality Date   COLECTOMY WITH COLOSTOMY CREATION/HARTMANN PROCEDURE N/A 04/15/2021   Procedure: COLOSTOMY CREATION/HARTMANN PROCEDURE;  Surgeon: Sheldon Standing, MD;  Location: [facility];  Service: General;  Laterality: N/A;   IR CATHETER TUBE CHANGE  12/12/2020   IR CATHETER TUBE CHANGE  01/27/2021   IR CATHETER TUBE CHANGE  02/19/2021   IR CATHETER TUBE CHANGE  04/13/2021   IR IMAGING GUIDED PORT INSERTION  10/02/2021   IR RADIOLOGIST EVAL & MGMT  11/27/2020   IR RADIOLOGIST EVAL & MGMT  12/11/2020   IR RADIOLOGIST EVAL & MGMT  01/07/2021   IR RADIOLOGIST EVAL & MGMT  02/04/2021   IR SINUS/FIST TUBE CHK-NON GI  12/12/2020   IR SINUS/FIST TUBE CHK-NON GI  02/19/2021   LAPAROSCOPIC PARTIAL COLECTOMY N/A 04/15/2021   Procedure: LAPAROSCOPIC ASSISTED HARTMANN RESECTION;  Surgeon: Sheldon Standing, MD;  Location: [facility];  Service: General;  Laterality: N/A;   Patient Active Problem List   Diagnosis Date Noted   Obesity due to  excess calories with body mass index (BMI) 120% of 95th percentile to less than 140% of 95th percentile for age in pediatric patient 11/15/2023   Viral upper respiratory tract infection 08/07/2023   Myalgia 03/29/2023   Chronic midline thoracic back pain 03/29/2023   Macromastia 03/29/2023   Lower extremity edema 03/29/2023   Urinary tract infection without hematuria 12/02/2022   Arthralgia 10/13/2022   Nerve pain 09/20/2022   Insomnia due to medical condition 09/20/2022   Irritant contact dermatitis associated with fecal stoma 07/14/2022   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   Anemia, iron  deficiency 02/22/2022   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) 12/21/2021   Elevated troponin 10/07/2021   H/O colon cancer, stage II 10/07/2021   Fever 10/07/2021   Palliative care patient 08/17/2021   PICC (peripherally inserted central catheter) in place 08/17/2021   Genetic testing 08/04/2021   FAP (familial adenomatous polyposis) 08/04/2021   Family history of colon cancer 06/29/2021   Family history of breast cancer 06/29/2021   Family history of stomach cancer 06/29/2021   Prolonged Q-T interval on ECG    Hypokalemia    Acute blood loss anemia    Sinus tachycardia    Debility 05/01/2021   Malignant neoplasm of sigmoid  colon (HCC)    Diverticulitis of large intestine with abscess 04/10/2021   Essential hypertension 04/10/2021   SIRS (systemic inflammatory response syndrome) (HCC) 04/10/2021   Hypokalemia, inadequate intake 04/10/2021   Reactive thrombocytosis 04/10/2021   Prolonged QT interval 04/10/2021   Diverticulitis of colon with perforation 11/04/2020   Anemia 07/11/2019   Inappropriate sinus tachycardia (HCC) 07/11/2019   Obstructive sleep apnea 11/27/2018   Morbid obesity with body mass index of 60.0-69.9 in adult [facility]) 07/31/2018   Class 3 severe obesity due to excess calories with serious comorbidity and body mass index (BMI) of 50.0 to 59.9 in adult    SVD (spontaneous  vaginal delivery) 12/16/2012    REFERRING PROVIDER: Dr [name]  REFERRING DIAG: Back pain, Deconditioning  Rationale for Evaluation and Treatment: Rehabilitation  THERAPY DIAG:  Malignant neoplasm of colon, unspecified part of colon (HCC)  Muscle weakness (generalized)  Other low back pain  ONSET DATE: 2024  SUBJECTIVE:                                                                                                                                                                                           SUBJECTIVE STATEMENT: I tried in the bathtub, besides getting stuck in the tub my colostomy bag seemed to stay sealed.  Its a bad today.  I'm just kind of having pain everywhere.     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 A CT in 06/2021 revealed metastatic disease in left psoas, abdominal wall and pertitoneal areas. She underwent chemo therapy at that time. She is currently on Keytruda  and her last guardant reveal on 10/13/2023 was negative. She has a LUQ colostomy. She has had some anemia believed to be due to heavy menstrual bleeding. She has ongoing body and back pain with some resulting from immunotherapy.  PAIN:  Are you having pain? Yes: NPRS scale: 5/10 Pain location: across LB, with cramps left hip, legs Pain description: burning in back and sometimes moves into posterior thighs Aggravating factors: sitting too long, standing more than 10 min, walking 10 min Relieving factors: change activities,   PRECAUTIONS: hx of colon cancer with colostomy  RED FLAGS: Bowel or bladder incontinence: Yes: colostomy  Has a waterproof colostomy bag she will try and see how she does. WEIGHT BEARING RESTRICTIONS: No  FALLS:  Has patient fallen in last 6 months? No  LIVING ENVIRONMENT: Lives with: lives with their spouse and 3  children, 20, 25, and  11 Lives in: House/apartment Stairs: Yes: Internal: 14 steps; on right going up and External: 4 steps; none Has following equipment at home: Quad cane small base, getting a shower bench  OCCUPATION: not working  PLOF: Independent with basic ADLsneeds help with socks, shoes, bathing,  PATIENT GOALS: Decrease, Be able to be more active     OBJECTIVE:  Note: Objective measures were completed at Evaluation unless otherwise noted.  DIAGNOSTIC FINDINGS:  No lytic disease noted with CT abdomen  PATIENT SURVEYS:  Modified Oswestry:  MODIFIED OSWESTRY DISABILITY SCALE  Date: 01/23/2024 Score  Pain intensity 5 =  Pain medication has no effect on my pain.  2. Personal care (washing, dressing, etc.) 3 =  I need help, but I am able to manage most of my personal care.  3. Lifting 2 = Pain prevents me from lifting heavy weights off the floor, activities (eg. sports, dancing). but I can manage if the weights are conveniently positioned (3) Pain prevents me from going out very often. (eg, on a table).  4. Walking 3 =  Pain prevents me from walking more than  mile.  5. Sitting 2 =  Pain prevents me from sitting more than 1 hour.  6. Standing 4 =  Pain prevents me from standing more than 10 minutes.  7. Sleeping 3 =  Even when I take pain medication, I sleep less than 4 hours.  8. Social Life 3 =  Pain prevents me from going out very often.  9. Traveling 2 =  My pain restricts my travel over 2 hours.  10. Employment/ Homemaking 3 = Pain prevents me from doing anything but light duties.  Total 30/50 =60%   Interpretation of scores: Score Category Description  0-20% Minimal Disability The patient can cope with most living activities. Usually no treatment is indicated apart from advice on lifting, sitting and exercise  21-40% Moderate Disability The patient experiences more pain and difficulty with sitting, lifting and standing. Travel and social life are more difficult and  they may be disabled from work. Personal care, sexual activity and sleeping are not grossly affected, and the patient can usually be managed by conservative means  41-60% Severe Disability Pain remains the main problem in this group, but activities of daily living are affected. These patients require a detailed investigation  61-80% Crippled Back pain impinges on all aspects of the patient's life. Positive intervention is required  81-100% Bed-bound  These patients are either bed-bound or exaggerating their symptoms  Bluford FORBES Zoe DELENA Karon DELENA, et al. Surgery versus conservative management of stable thoracolumbar fracture: the PRESTO feasibility RCT. [place] ([place]): [employer]; 2021 Nov. [facility] Technology Assessment, No. 25.62.) Appendix 3, Oswestry Disability Index category descriptors. Available from: [employer]cz  Minimally Clinically Important Difference (MCID) = 12.8%    LEFS: 7/80  COGNITION: Overall cognitive status: Within functional limits for tasks assessed     SENSATION: CIPN fingertips, toes  MUSCLE LENGTH: Hamstrings: Right 70 deg; Left 55 deg Thomas test: Right - ; Left pain in back with left leg extended and Right knee to chest FABER; Felt good on back both legs done separately, mildly restricted bilaterally  POSTURE: rounded shoulders, forward head, increased lumbar lordosis, and anterior pelvic tilt, bilateral genu valgus  PALPATION: Right iliac high  LUMBAR ROM:   AROM eval  Flexion  Limited by body habitus Can reach hands several inches Above knees worse  Extension WFL better  Right lateral flexion Increased NW, reach to 1-2 inches  above knee  Left lateral flexion Better, reach 1-2 inches above knee  Right rotation   Left rotation    (Blank rows = not tested)  LOWER EXTREMITY  MMT    STRENGTH  Right eval Left eval  Hip flexion 3+, trembles 4-  Hip extension    Hip abduction Seated 4/5 4+  Hip  adduction    Hip internal rotation 4 4  Hip external rotation 4+ 4+  Knee flexion 5 5  Knee extension 4- 4  Ankle dorsiflexion 4+ 4+  Ankle plantarflexion    Ankle inversion    Ankle eversion     (Blank rows = not tested)     (Blank rows = not tested)  LUMBAR SPECIAL TESTS:  Straight leg raise test: Negative and FABER test: Negative  FUNCTIONAL TESTS:  30 second sit to stand;    TREATMENT DATE:  02/09/24 NuStep Resistance 6, LE 7/ UE 6 x 10:43 mins with 849 steps Reviewed HEP with performance of each:   LTR 3 x 6 bil   Manual Hamstring stretches: bil 2x30   PPT/TrA with reminder of cueing x 10   With ball squeeze 4 x 10    With mini march x 6bil  (Increased pain)    Sidelying abduction x 10 bil  Bil heel raises x 10 with VC's Mini squat x 10   Bil tandem stance x 30 sec each  Bil SLS x 30 sec each, VC's throughout all to relax shoulders and rely on UE support as little as possible  Large ball roll out at the middle, left, and right 2x20  Mermaid stretch x 2 bil  Calf stretches 2x20 bil   02/07/24: NuStep Resistance 6, LE 7/ UE 6 x 10 mins with 635 steps Reviewed HEP with performance of each:   LTR 3 x 6 bil   PPT/TrA with reminder of cueing x 10   With ball squeeze 4 x 10    With mini march x 6bil   Seated EOB for bil HS stretch x 2 reps, 20 sec holds with foot on 6 step  Bil heel raises x 10 with VC's Mini squat x 10   Bil tandem stance x 30 sec each  Bil SLS x 30 sec each, VC's throughout all to relax shoulders and rely on UE support as little as possible  Large ball roll out at the middle, left, and right 2x20  Mermaid stretch x 2 bil  02/01/24: Therapeutic Exercises NuStep Resistance 6, LE 7/ UE 6 x 10:30 mins with 635 steps Seated EOB for bil HS stretch x 2 reps, 20 sec holds  Standing at // bars and placed foot in chair behind her for hip flexor stretch x 1 each 45-60 sec with VC's during for proper technique for most effective stretch   Therapeutic Activities 30 sec sit - stand 11 reps In // bars for following: Bil hip 3 way raises into flex, abd and ext x 10 each returning therapist demo Bil heel raises x 10 with VC's  Bil tandem stance x 30 sec each  Bil SLS x 30 sec each, VC's throughout all to relax shoulders and rely on UE support as little as possible Supine on mat table for core strength: Posterior pelvic tilt x 12 reps total, 5 sec holds returning therapist demo, then tilt with following: clams 3 x 5, alt mini march 2 x 5 with LTR stretches between sets due to feeling like back was going to spasm, and then  purple ball squeeze 3 x 5   01/23/2024 Discussed POC, LOS, treatment interventions. Pt. would benefit from the pool and pt will check to see if her waterproof colostomy bags work so she can try aquatics. Pts pain improved with BB and was worse with FB. Educated how to check pain in standing, perform BB and recheck pain. Discussed stopping if pain is increased or may continue if pain is overall decreased. Showed how to position hands to support back or use a towel to support back. Educated in use of a lumbar roll for sitting if they have a proper chair. Scheduled in clinic 2x/week but will make 1 aquatic each week if waterproof pouch works for colostomy.                                                                                                                                  PATIENT EDUCATION:  Education details: Bil LE strength, flexibility, balance, and core strength  Person educated: Patient and Spouse Education method: Explanation, Demonstration, and Handouts Education comprehension: verbalized understanding and returned demonstration, tactile and VC's during and will benefit from review to assess technique  HOME EXERCISE PROGRAM: Standing BB, checking pain before and after and stopping if pain is increased Towel roll for sitting posture 02/01/24 - Bil LE strength, flexibility, and core  strength  ASSESSMENT:  CLINICAL IMPRESSION: Continued POC.  Pt would like to try aquatics as her bag did well in the tub.  I told her that maybe she could try for 10-62min and then get out and check the seal and then get back in. Pt is having increased overall pain today due to weather.     OBJECTIVE IMPAIRMENTS: Abnormal gait, decreased activity tolerance, decreased balance, decreased knowledge of condition, decreased mobility, difficulty walking, decreased ROM, decreased strength, increased muscle spasms, improper body mechanics, postural dysfunction, obesity, and pain.   ACTIVITY LIMITATIONS: lifting, bending, sitting, standing, squatting, sleeping, stairs, bed mobility, bathing, dressing, locomotion level, and caring for others  PARTICIPATION LIMITATIONS: meal prep, cleaning, laundry, shopping, and community activity  PERSONAL FACTORS: Past/current experiences and 3+ comorbidities: colon cancer, chemotherapy, obesity are also affecting patient's functional outcome.   REHAB POTENTIAL: Good  CLINICAL DECISION MAKING: Evolving/moderate complexity  EVALUATION COMPLEXITY: Moderate   GOALS: Goals reviewed with patient? Yes  SHORT TERM GOALS: Target date: 02/20/2024  Pt will be independent in a HEP for ROM, UE and LE and core strength Baseline: Goal status: INITIAL  2.  Pt will have decreased Back pain by 25% for improved tolerance for home activities Baseline:  Goal status: INITIAL  3.  Pt will be able to walk 10-15 min with several rest periods with min LBP Baseline:  Goal status: INITIAL  4.  Pt will be tolerant of aquatic therapy as indicated (has waterproof pouch that works) Baseline:  Goal status: INITIAL   LONG TERM GOALS: Target date: 03/19/2024 Pt will have decreased back pain by 50%  or greater for improved ability to tolerate home activities Baseline:  Goal status: INITIAL  2.  ODI will be no greater than 30%  to demonstrate improved function Baseline:  60% Goal status: INITIAL  3.  Pt will be able to walk 8/10 of a mile with rest periods as done previously with minimal LBP Baseline:  Goal status: INITIAL  4.  Pt will be able to stand long enough to fix small meal without LBP Baseline:  Goal status: INITIAL  5.  Pt will be educated in proper body mechanics for home activities Baseline:  Goal status: INITIAL  6.  Pt will be able to go up stairs with alternate gait pattern Baseline:  Goal status: INITIAL  PLAN:  PT FREQUENCY: 2x/week  PT DURATION: 8 weeks  PLANNED INTERVENTIONS: 97164- PT Re-evaluation, 97110-Therapeutic exercises, 97530- Therapeutic activity, [id number]- Neuromuscular re-education, 97535- Self Care, [id number]- Manual therapy, [id number]- Gait training, [id number]- Aquatic Therapy, Cryotherapy, and Moist heat.  PLAN FOR NEXT SESSION: Do TUG, review HEP, check back pain and understanding of exs, UE, LE, core strength, aquatics 1x per week in place of 1 clinic therapy if colostomy pouch is waterproof, body mechanics education, CP, MH prn   Mikayla Rios, Mikayla Rios, PT 02/09/2024, 5:06 PM          [facility] [phone] HIP: Flexion Standing    Holding onto counter lift leg straight in front keeping knee straight. Slow and controlled! _10__ reps per set, _2-3__ sets per day. Then repeat with other leg.   Hip Extension (Standing)    Stand with support at counter. Squeeze pelvic floor and hold so as not to twist hips and don't lean forward.  Move right leg backward with straight knee. Slow and controlled! Repeat _10__ times. Do _2-3__ times a day. Repeat with other leg.  Hip Abduction (Standing)    Stand with support. Squeeze pelvic floor and hold. Lift right leg out to side, keeping toe forward.  Repeat _10__ times. Do _2-3__ times a day. Repeat with other leg.   Heel Raise: Bilateral (Standing)      Stand near counter for fingertip support if needed. Rise on balls of feet. Repeat __10-20__ times per set. Do _1-2___ sets per  session. Do __2__ sessions per day.  SINGLE LIMB STANCE    Stand at counter (corner if you have one) for minimal arm support. Raise leg. Hold _10-20__ seconds. Repeat with other leg. Once this becomes easier, for increased challenge, close eyes with fingertips on counter for safety. _3-5__ reps per set, _2-3__ sets per day.  Tandem Stance    Stand at counter (corner if you have one). Right foot in front of left, heel touching toe both feet straight ahead. Stand on Foot Triangle of Support with both feet. Balance in this position _10-20__ seconds. Do with left foot in front of right. Once this becomes easier, for increased challenge, close eyes with fingertips on counter for safety.  Mini Squat: Double Leg    Standing at counter. With feet shoulder width apart, reach forward for balance and do a mini squat. Keep knees in line with second toe. Knees do not go past toes. Repeat _5__ times per set. Do __2_ sets per session.  PELVIC TILT: Posterior    Tighten abdominals, flatten low back. _10__ reps per set, __1-2_ sets per day.  Tilt with : Clams  Alternate mini march Pillow squeeze   2-3 sets of 5 reps  Stretches  Low trunk rotation laying on bed Hamstring stretch  seated at edge of chair with knee straight and lean forward leading with chest Standing at dining room table, place foot in chair behind you and stand tall for stretch in thigh.  Hold 20-30 sec, 2-3 reps throughout day

## 2024-02-13 ENCOUNTER — Ambulatory Visit

## 2024-02-13 DIAGNOSIS — M5459 Other low back pain: Secondary | ICD-10-CM

## 2024-02-13 DIAGNOSIS — C189 Malignant neoplasm of colon, unspecified: Secondary | ICD-10-CM

## 2024-02-13 DIAGNOSIS — M6281 Muscle weakness (generalized): Secondary | ICD-10-CM | POA: Diagnosis not present

## 2024-02-13 NOTE — Patient Instructions (Signed)
 [facility]  Welcome to [facility]! Here you will find all the information you will need regarding your pool therapy. If you have further questions at any time, please call our office at [phone]. After completing your initial evaluation in the [facility], you may be eligible to complete a portion of your therapy in the pool. A typical week of therapy will consist of 1-2 typical physical therapy visits at our [facility] location and an additional session of therapy in the pool located at the [facility] at [facility]. [address], [place] [address]. The phone number at the pool site is [phone]. Please call this number if you are running late or need to cancel your appointment.  Check-in on MyChart then meet your therapist at the pool deck. (If you can't access MyChart, you may check in with the therapist at the pool deck.)   Each session will last approximately 45 minutes. All scheduling and payments for aquatic therapy sessions, including cancelations, will be done through our [facility] location.  To be eligible for aquatic therapy, these criteria must be met: You must be able to independently change in the locker room and get to the pool deck. A caregiver can come with you to help if needed however they do need to be the same sex to enter the locker room. Or you may change in a bathroom privately with opposite sex caregiver if needed. There are benches for a caregiver to sit on next to the pool.  Handicap parking is available in the front and there is a drop off option for even closer accessibility.  Please arrive 15 minutes prior to your appointment to prepare for your pool session. You must sign in at the front desk upon your arrival. Please be sure to attend to any toileting needs prior to entering the pool. Locker rooms for changing are available.  There is direct access to the pool deck from the locker  room. You can lock your belongings in a locker or bring them with you poolside. Your therapist will greet you on the pool deck. There may be other swimmers in the pool at the same time but your session is one-on-one with the therapist.    What to Expect Arrive 15 min early for your appointment and check in with rehab front desk. Please limit use of body lotions and hair products before entering the pool. Locker rooms are available for showering, changing and toileting. Appointments are 45 minutes with your therapist. (This does not include changing times) The pool is approximately 500 feet from the nearest parking lot. There are benches and chairs along the walk. Please bring a support person if you need assistance traveling the distanceto the pool or assistance with changing/toileting. Stairs with handrails as well as a lift chair are available at the pool.  Depth is 3'6"-4'8" and temperature is between 88-90 degrees. The pool deck is tile flooring and gets slippery, water  shoes are strongly encouraged but not required. Please wear a bathing suit or athletic shorts and a t-shirt. Recommended to bring your own towel. Severe weather:Thunder or lightning results in closure of the pool deck for 30 minutes and is extended with each incidence. Your appointment may be moved to land or canceled with the option to reschedule. Tell your therapist if you have any of the following: Open wounds Active infection Fear of water  Bowel or bladder incontinence  Benefits of Aquatic Therapy:  Reduces Stress on Joints  and Muscles  The buoyancy of water  supports body weight, making movement easier and less painful.  Builds Strength and Stability  The viscosity of water  provides resistance that allows individuals to strengthen muscles while also providing a safe environment to improve balance and coordination.  Promotes Relaxation  The warm water  and the feeling of being supported can help reduce stress and be  beneficial for overall well-being.

## 2024-02-13 NOTE — Therapy (Signed)
 OUTPATIENT PHYSICAL THERAPY THORACOLUMBAR TREATMENT   Patient Name: Mikayla Rios MRN: [id number] DOB:07-Mar-1986, 38 y.o., female Today's Date: 02/13/2024  END OF SESSION:  PT End of Session - 02/13/24 1515     Visit Number 5    Number of Visits 16    Date for PT Re-Evaluation 03/19/24    Authorization Type None needed    Authorization Time Period 30 visits/year    Authorization - Visit Number 5    Authorization - Number of Visits 30    PT Start Time 1514    PT Stop Time 1558    PT Time Calculation (min) 44 min    Activity Tolerance Patient tolerated treatment well    Behavior During Therapy WFL for tasks assessed/performed           Past Medical History:  Diagnosis Date   Allergy    seasonal   Anemia    Blood infection 1985   Blood transfusion without reported diagnosis    colon ca 04/2021   Diverticulitis    Family history of breast cancer    Family history of colon cancer    Family history of colon cancer    Family history of stomach cancer    Hypertension    Obesity    Sleep apnea    Past Surgical History:  Procedure Laterality Date   COLECTOMY WITH COLOSTOMY CREATION/HARTMANN PROCEDURE N/A 04/15/2021   Procedure: COLOSTOMY CREATION/HARTMANN PROCEDURE;  Surgeon: Sheldon Standing, MD;  Location: [facility];  Service: General;  Laterality: N/A;   IR CATHETER TUBE CHANGE  12/12/2020   IR CATHETER TUBE CHANGE  01/27/2021   IR CATHETER TUBE CHANGE  02/19/2021   IR CATHETER TUBE CHANGE  04/13/2021   IR IMAGING GUIDED PORT INSERTION  10/02/2021   IR RADIOLOGIST EVAL & MGMT  11/27/2020   IR RADIOLOGIST EVAL & MGMT  12/11/2020   IR RADIOLOGIST EVAL & MGMT  01/07/2021   IR RADIOLOGIST EVAL & MGMT  02/04/2021   IR SINUS/FIST TUBE CHK-NON GI  12/12/2020   IR SINUS/FIST TUBE CHK-NON GI  02/19/2021   LAPAROSCOPIC PARTIAL COLECTOMY N/A 04/15/2021   Procedure: LAPAROSCOPIC ASSISTED HARTMANN RESECTION;  Surgeon: Sheldon Standing, MD;  Location: [facility];  Service: General;  Laterality:  N/A;   Patient Active Problem List   Diagnosis Date Noted   Obesity due to excess calories with body mass index (BMI) 120% of 95th percentile to less than 140% of 95th percentile for age in pediatric patient 11/15/2023   Viral upper respiratory tract infection 08/07/2023   Myalgia 03/29/2023   Chronic midline thoracic back pain 03/29/2023   Macromastia 03/29/2023   Lower extremity edema 03/29/2023   Urinary tract infection without hematuria 12/02/2022   Arthralgia 10/13/2022   Nerve pain 09/20/2022   Insomnia due to medical condition 09/20/2022   Irritant contact dermatitis associated with fecal stoma 07/14/2022   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   Anemia, iron  deficiency 02/22/2022   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) 12/21/2021   Elevated troponin 10/07/2021   H/O colon cancer, stage II 10/07/2021   Fever 10/07/2021   Palliative care patient 08/17/2021   PICC (peripherally inserted central catheter) in place 08/17/2021   Genetic testing 08/04/2021   FAP (familial adenomatous polyposis) 08/04/2021   Family history of colon cancer 06/29/2021   Family history of breast cancer 06/29/2021   Family history of stomach cancer 06/29/2021   Prolonged Q-T interval on ECG    Hypokalemia    Acute blood loss anemia  Sinus tachycardia    Debility 05/01/2021   Malignant neoplasm of sigmoid colon (HCC)    Diverticulitis of large intestine with abscess 04/10/2021   Essential hypertension 04/10/2021   SIRS (systemic inflammatory response syndrome) (HCC) 04/10/2021   Hypokalemia, inadequate intake 04/10/2021   Reactive thrombocytosis 04/10/2021   Prolonged QT interval 04/10/2021   Diverticulitis of colon with perforation 11/04/2020   Anemia 07/11/2019   Inappropriate sinus tachycardia (HCC) 07/11/2019   Obstructive sleep apnea 11/27/2018   Morbid obesity with body mass index of 60.0-69.9 in adult [facility]) 07/31/2018   Class 3 severe obesity due to excess calories with serious  comorbidity and body mass index (BMI) of 50.0 to 59.9 in adult    SVD (spontaneous vaginal delivery) 12/16/2012    REFERRING PROVIDER: Dr [name]  REFERRING DIAG: Back pain, Deconditioning  Rationale for Evaluation and Treatment: Rehabilitation  THERAPY DIAG:  Malignant neoplasm of colon, unspecified part of colon (HCC)  Muscle weakness (generalized)  Other low back pain  ONSET DATE: 2024  SUBJECTIVE:                                                                                                                                                                                           SUBJECTIVE STATEMENT: Back was bothering me last week, but no worse with PT.  I feel sore in my back and both legs today, but not bad. She had several calls for openings with aquatic therapy but they were not at times that she could attend.   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 A CT in 06/2021 revealed metastatic disease in left psoas, abdominal wall and pertitoneal areas. She underwent chemo therapy at that time. She is currently on Keytruda  and her last guardant reveal on 10/13/2023 was negative. She has a LUQ colostomy. She has had some anemia believed to be due to heavy menstrual bleeding. She has ongoing body and back pain with some resulting from immunotherapy.  PAIN:  Are you having pain? Yes: NPRS scale: 4/10 Pain location: across LB, Bilateral entire leg Pain description: burning in back and sometimes moves into posterior thighs Aggravating factors: sitting too long, standing more than 10 min, walking 10 min Relieving factors: change activities,   PRECAUTIONS: hx of colon cancer with colostomy  RED FLAGS: Bowel or bladder incontinence: Yes: colostomy  Has a waterproof colostomy bag she will try and see how she does. WEIGHT  BEARING RESTRICTIONS: No  FALLS:  Has  patient fallen in last 6 months? No  LIVING ENVIRONMENT: Lives with: lives with their spouse and 3 children, 21, 13, and 41 Lives in: House/apartment Stairs: Yes: Internal: 14 steps; on right going up and External: 4 steps; none Has following equipment at home: Quad cane small base, getting a shower bench  OCCUPATION: not working  PLOF: Independent with basic ADLsneeds help with socks, shoes, bathing,  PATIENT GOALS: Decrease, Be able to be more active     OBJECTIVE:  Note: Objective measures were completed at Evaluation unless otherwise noted.  DIAGNOSTIC FINDINGS:  No lytic disease noted with CT abdomen  PATIENT SURVEYS:  Modified Oswestry:  MODIFIED OSWESTRY DISABILITY SCALE  Date: 01/23/2024 Score  Pain intensity 5 =  Pain medication has no effect on my pain.  2. Personal care (washing, dressing, etc.) 3 =  I need help, but I am able to manage most of my personal care.  3. Lifting 2 = Pain prevents me from lifting heavy weights off the floor, activities (eg. sports, dancing). but I can manage if the weights are conveniently positioned (3) Pain prevents me from going out very often. (eg, on a table).  4. Walking 3 =  Pain prevents me from walking more than  mile.  5. Sitting 2 =  Pain prevents me from sitting more than 1 hour.  6. Standing 4 =  Pain prevents me from standing more than 10 minutes.  7. Sleeping 3 =  Even when I take pain medication, I sleep less than 4 hours.  8. Social Life 3 =  Pain prevents me from going out very often.  9. Traveling 2 =  My pain restricts my travel over 2 hours.  10. Employment/ Homemaking 3 = Pain prevents me from doing anything but light duties.  Total 30/50 =60%   Interpretation of scores: Score Category Description  0-20% Minimal Disability The patient can cope with most living activities. Usually no treatment is indicated apart from advice on lifting, sitting and exercise  21-40% Moderate  Disability The patient experiences more pain and difficulty with sitting, lifting and standing. Travel and social life are more difficult and they may be disabled from work. Personal care, sexual activity and sleeping are not grossly affected, and the patient can usually be managed by conservative means  41-60% Severe Disability Pain remains the main problem in this group, but activities of daily living are affected. These patients require a detailed investigation  61-80% Crippled Back pain impinges on all aspects of the patient's life. Positive intervention is required  81-100% Bed-bound  These patients are either bed-bound or exaggerating their symptoms  Bluford FORBES Zoe DELENA Karon DELENA, et al. Surgery versus conservative management of stable thoracolumbar fracture: the PRESTO feasibility RCT. [place]): [employer]; 2021 Nov. [facility] Technology Assessment, No. 25.62.) Appendix 3, Oswestry Disability Index category descriptors. Available from: [employer]cz  Minimally Clinically Important Difference (MCID) = 12.8%    LEFS: 7/80  COGNITION: Overall cognitive status: Within functional limits for tasks assessed     SENSATION: CIPN fingertips, toes  MUSCLE LENGTH: Hamstrings: Right 70 deg; Left 55 deg Thomas test: Right - ; Left pain in back with left leg extended and Right knee to chest FABER; Felt good on back both legs done separately, mildly restricted bilaterally  POSTURE: rounded shoulders, forward head, increased lumbar lordosis, and anterior pelvic tilt, bilateral genu valgus  PALPATION: Right iliac high  LUMBAR ROM:   AROM eval  Flexion  Limited by body habitus  Can reach hands several inches Above knees worse  Extension WFL better  Right lateral flexion Increased NW, reach to 1-2 inches above knee  Left lateral flexion Better, reach 1-2 inches above knee  Right rotation   Left rotation    (Blank rows = not tested)  LOWER  EXTREMITY  MMT    STRENGTH  Right eval Left eval  Hip flexion 3+, trembles 4-  Hip extension    Hip abduction Seated 4/5 4+  Hip adduction    Hip internal rotation 4 4  Hip external rotation 4+ 4+  Knee flexion 5 5  Knee extension 4- 4  Ankle dorsiflexion 4+ 4+  Ankle plantarflexion    Ankle inversion    Ankle eversion     (Blank rows = not tested)     (Blank rows = not tested)  LUMBAR SPECIAL TESTS:  Straight leg raise test: Negative and FABER test: Negative  FUNCTIONAL TESTS:  30 second sit to stand;    TREATMENT DATE:   02/13/2024 Nu Step LE 7, UE 6 Lev 6 x 8 min 654 steps Incline stretch 3 x 25 sec at // bars Mini squats x 10 holding // bars Marching x 10 ea with HH, emphasis on core Heel raises x 10 in //, emphasis on core Tandem stance B x 30 sec no LOB Step onto ax x 5 B forward and 5 B sideways emphasis on core DLS on ax x 30 sec increased pain, discontinued HS stretches seated with leg on 6 in step 3 x 30 B Figure 4 stretch x 2 B 30 sec Ppt with low march x 10, emphasis on core Supine clam yellow x 10 with ppt and emphasis on core LTR x 5-6 B Printed aquatic instructions and suggested pt go look at the pool and see if she can tolerate the distance walked to get to pool,knowing she can stop and rest at chairs on way in. Also discussed she may want to talk with therapist and explain she needs to check her colostomy bag periodically.  02/09/24 NuStep Resistance 6, LE 7/ UE 6 x 10:43 mins with 849 steps Reviewed HEP with performance of each:   LTR 3 x 6 bil   Manual Hamstring stretches: bil 2x30   PPT/TrA with reminder of cueing x 10   With ball squeeze 4 x 10    With mini march x 6bil  (Increased pain)    Sidelying abduction x 10 bil  Bil heel raises x 10 with VC's Mini squat x 10   Bil tandem stance x 30 sec each  Bil SLS x 30 sec each, VC's throughout all to relax shoulders and rely on UE support as little as possible  Large ball roll out at the  middle, left, and right 2x20  Mermaid stretch x 2 bil  Calf stretches 2x20 bil   02/07/24: NuStep Resistance 6, LE 7/ UE 6 x 10 mins with 635 steps Reviewed HEP with performance of each:   LTR 3 x 6 bil   PPT/TrA with reminder of cueing x 10   With ball squeeze 4 x 10    With mini march x 6bil   Seated EOB for bil HS stretch x 2 reps, 20 sec holds with foot on 6 step  Bil heel raises x 10 with VC's Mini squat x 10   Bil tandem stance x 30 sec each  Bil SLS x 30 sec each, VC's throughout all to relax shoulders and rely on UE  support as little as possible  Large ball roll out at the middle, left, and right 2x20  Mermaid stretch x 2 bil  02/01/24: Therapeutic Exercises NuStep Resistance 6, LE 7/ UE 6 x 10:30 mins with 635 steps Seated EOB for bil HS stretch x 2 reps, 20 sec holds  Standing at // bars and placed foot in chair behind her for hip flexor stretch x 1 each 45-60 sec with VC's during for proper technique for most effective stretch  Therapeutic Activities 30 sec sit - stand 11 reps In // bars for following: Bil hip 3 way raises into flex, abd and ext x 10 each returning therapist demo Bil heel raises x 10 with VC's  Bil tandem stance x 30 sec each  Bil SLS x 30 sec each, VC's throughout all to relax shoulders and rely on UE support as little as possible Supine on mat table for core strength: Posterior pelvic tilt x 12 reps total, 5 sec holds returning therapist demo, then tilt with following: clams 3 x 5, alt mini march 2 x 5 with LTR stretches between sets due to feeling like back was going to spasm, and then purple ball squeeze 3 x 5   01/23/2024 Discussed POC, LOS, treatment interventions. Pt. would benefit from the pool and pt will check to see if her waterproof colostomy bags work so she can try aquatics. Pts pain improved with BB and was worse with FB. Educated how to check pain in standing, perform BB and recheck pain. Discussed stopping if pain is increased or may  continue if pain is overall decreased. Showed how to position hands to support back or use a towel to support back. Educated in use of a lumbar roll for sitting if they have a proper chair. Scheduled in clinic 2x/week but will make 1 aquatic each week if waterproof pouch works for colostomy.                                                                                                                                  PATIENT EDUCATION:  Education details: Bil LE strength, flexibility, balance, and core strength  Person educated: Patient and Spouse Education method: Explanation, Demonstration, and Handouts Education comprehension: verbalized understanding and returned demonstration, tactile and VC's during and will benefit from review to assess technique  HOME EXERCISE PROGRAM: Standing BB, checking pain before and after and stopping if pain is increased Towel roll for sitting posture 02/01/24 - Bil LE strength, flexibility, and core strength  ASSESSMENT:  CLINICAL IMPRESSION: Pt reports pain about the same after rx but feels like she may get a cramp in her back. Changing positions today didn't seem to help with pain much. She feels it is from the treatment medications she takes. Gave printout for aquatics and suggested she check out the pool area to determine if she can walk the distance to the pool that is required stopping to sit prn.  OBJECTIVE IMPAIRMENTS: Abnormal gait, decreased activity tolerance, decreased balance, decreased knowledge of condition, decreased mobility, difficulty walking, decreased ROM, decreased strength, increased muscle spasms, improper body mechanics, postural dysfunction, obesity, and pain.   ACTIVITY LIMITATIONS: lifting, bending, sitting, standing, squatting, sleeping, stairs, bed mobility, bathing, dressing, locomotion level, and caring for others  PARTICIPATION LIMITATIONS: meal prep, cleaning, laundry, shopping, and community activity  PERSONAL FACTORS:  Past/current experiences and 3+ comorbidities: colon cancer, chemotherapy, obesity are also affecting patient's functional outcome.   REHAB POTENTIAL: Good  CLINICAL DECISION MAKING: Evolving/moderate complexity  EVALUATION COMPLEXITY: Moderate   GOALS: Goals reviewed with patient? Yes  SHORT TERM GOALS: Target date: 02/20/2024  Pt will be independent in a HEP for ROM, UE and LE and core strength Baseline: Goal status: INITIAL  2.  Pt will have decreased Back pain by 25% for improved tolerance for home activities Baseline:  Goal status: INITIAL  3.  Pt will be able to walk 10-15 min with several rest periods with min LBP Baseline:  Goal status: INITIAL  4.  Pt will be tolerant of aquatic therapy as indicated (has waterproof pouch that works) Baseline:  Goal status: INITIAL   LONG TERM GOALS: Target date: 03/19/2024 Pt will have decreased back pain by 50% or greater for improved ability to tolerate home activities Baseline:  Goal status: INITIAL  2.  ODI will be no greater than 30%  to demonstrate improved function Baseline: 60% Goal status: INITIAL  3.  Pt will be able to walk 8/10 of a mile with rest periods as done previously with minimal LBP Baseline:  Goal status: INITIAL  4.  Pt will be able to stand long enough to fix small meal without LBP Baseline:  Goal status: INITIAL  5.  Pt will be educated in proper body mechanics for home activities Baseline:  Goal status: INITIAL  6.  Pt will be able to go up stairs with alternate gait pattern Baseline:  Goal status: INITIAL  PLAN:  PT FREQUENCY: 2x/week  PT DURATION: 8 weeks  PLANNED INTERVENTIONS: 97164- PT Re-evaluation, 97110-Therapeutic exercises, 97530- Therapeutic activity, [id number]- Neuromuscular re-education, 97535- Self Care, [id number]- Manual therapy, [id number]- Gait training, [id number]- Aquatic Therapy, Cryotherapy, and Moist heat.  PLAN FOR NEXT SESSION: Do TUG, review HEP, check back pain and understanding  of exs, UE, LE, core strength, aquatics 1x per week in place of 1 clinic therapy if colostomy pouch is waterproof, body mechanics education, CP, MH prn   Grayce JINNY Sheldon, PT 02/13/2024, 5:26 PM          Cancer Rehab [phone] HIP: Flexion Standing    Holding onto counter lift leg straight in front keeping knee straight. Slow and controlled! _10__ reps per set, _2-3__ sets per day. Then repeat with other leg.   Hip Extension (Standing)    Stand with support at counter. Squeeze pelvic floor and hold so as not to twist hips and don't lean forward.  Move right leg backward with straight knee. Slow and controlled! Repeat _10__ times. Do _2-3__ times a day. Repeat with other leg.  Hip Abduction (Standing)    Stand with support. Squeeze pelvic floor and hold. Lift right leg out to side, keeping toe forward.  Repeat _10__ times. Do _2-3__ times a day. Repeat with other leg.   Heel Raise: Bilateral (Standing)      Stand near counter for fingertip support if needed. Rise on balls of feet. Repeat __10-20__ times per set. Do _1-2___ sets per session. Do __2__ sessions  per day.  SINGLE LIMB STANCE    Stand at counter (corner if you have one) for minimal arm support. Raise leg. Hold _10-20__ seconds. Repeat with other leg. Once this becomes easier, for increased challenge, close eyes with fingertips on counter for safety. _3-5__ reps per set, _2-3__ sets per day.  Tandem Stance    Stand at counter (corner if you have one). Right foot in front of left, heel touching toe both feet straight ahead. Stand on Foot Triangle of Support with both feet. Balance in this position _10-20__ seconds. Do with left foot in front of right. Once this becomes easier, for increased challenge, close eyes with fingertips on counter for safety.  Mini Squat: Double Leg    Standing at counter. With feet shoulder width apart, reach forward for balance and do a mini squat. Keep knees in line with second  toe. Knees do not go past toes. Repeat _5__ times per set. Do __2_ sets per session.  PELVIC TILT: Posterior    Tighten abdominals, flatten low back. _10__ reps per set, __1-2_ sets per day.  Tilt with : Clams  Alternate mini march Pillow squeeze   2-3 sets of 5 reps  Stretches  Low trunk rotation laying on bed Hamstring stretch seated at edge of chair with knee straight and lean forward leading with chest Standing at dining room table, place foot in chair behind you and stand tall for stretch in thigh.  Hold 20-30 sec, 2-3 reps throughout day

## 2024-02-15 ENCOUNTER — Ambulatory Visit

## 2024-02-15 DIAGNOSIS — M6281 Muscle weakness (generalized): Secondary | ICD-10-CM | POA: Diagnosis not present

## 2024-02-15 DIAGNOSIS — M5459 Other low back pain: Secondary | ICD-10-CM

## 2024-02-15 DIAGNOSIS — C189 Malignant neoplasm of colon, unspecified: Secondary | ICD-10-CM | POA: Diagnosis not present

## 2024-02-15 NOTE — Therapy (Signed)
 OUTPATIENT PHYSICAL THERAPY THORACOLUMBAR TREATMENT   Patient Name: Mikayla Rios MRN: [id number] DOB:[birth year], 38 y.o., female Today's Date: 02/15/2024  END OF SESSION:  PT End of Session - 02/15/24 1609     Visit Number 6    Number of Visits 16    Date for PT Re-Evaluation 03/19/24    Authorization Type None needed    Authorization Time Period 30 visits/year    Authorization - Visit Number 6    Authorization - Number of Visits 30    PT Start Time 1605    PT Stop Time 1645    PT Time Calculation (min) 40 min    Activity Tolerance Patient tolerated treatment well    Behavior During Therapy WFL for tasks assessed/performed           Past Medical History:  Diagnosis Date   Allergy    seasonal   Anemia    Blood infection 1985   Blood transfusion without reported diagnosis    colon ca 04/2021   Diverticulitis    Family history of breast cancer    Family history of colon cancer    Family history of colon cancer    Family history of stomach cancer    Hypertension    Obesity    Sleep apnea    Past Surgical History:  Procedure Laterality Date   COLECTOMY WITH COLOSTOMY CREATION/HARTMANN PROCEDURE N/A 04/15/2021   Procedure: COLOSTOMY CREATION/HARTMANN PROCEDURE;  Surgeon: Sheldon Standing, MD;  Location: [facility];  Service: General;  Laterality: N/A;   IR CATHETER TUBE CHANGE  12/12/2020   IR CATHETER TUBE CHANGE  01/27/2021   IR CATHETER TUBE CHANGE  02/19/2021   IR CATHETER TUBE CHANGE  04/13/2021   IR IMAGING GUIDED PORT INSERTION  10/02/2021   IR RADIOLOGIST EVAL & MGMT  11/27/2020   IR RADIOLOGIST EVAL & MGMT  12/11/2020   IR RADIOLOGIST EVAL & MGMT  01/07/2021   IR RADIOLOGIST EVAL & MGMT  02/04/2021   IR SINUS/FIST TUBE CHK-NON GI  12/12/2020   IR SINUS/FIST TUBE CHK-NON GI  02/19/2021   LAPAROSCOPIC PARTIAL COLECTOMY N/A 04/15/2021   Procedure: LAPAROSCOPIC ASSISTED HARTMANN RESECTION;  Surgeon: Sheldon Standing, MD;  Location: [facility];  Service: General;   Laterality: N/A;   Patient Active Problem List   Diagnosis Date Noted   Obesity due to excess calories with body mass index (BMI) 120% of 95th percentile to less than 140% of 95th percentile for age in pediatric patient 11/15/2023   Viral upper respiratory tract infection 08/07/2023   Myalgia 03/29/2023   Chronic midline thoracic back pain 03/29/2023   Macromastia 03/29/2023   Lower extremity edema 03/29/2023   Urinary tract infection without hematuria 12/02/2022   Arthralgia 10/13/2022   Nerve pain 09/20/2022   Insomnia due to medical condition 09/20/2022   Irritant contact dermatitis associated with fecal stoma 07/14/2022   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   Anemia, iron  deficiency 02/22/2022   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) 12/21/2021   Elevated troponin 10/07/2021   H/O colon cancer, stage II 10/07/2021   Fever 10/07/2021   Palliative care patient 08/17/2021   PICC (peripherally inserted central catheter) in place 08/17/2021   Genetic testing 08/04/2021   FAP (familial adenomatous polyposis) 08/04/2021   Family history of colon cancer 06/29/2021   Family history of breast cancer 06/29/2021   Family history of stomach cancer 06/29/2021   Prolonged Q-T interval on ECG    Hypokalemia    Acute blood loss anemia  Sinus tachycardia    Debility 05/01/2021   Malignant neoplasm of sigmoid colon (HCC)    Diverticulitis of large intestine with abscess 04/10/2021   Essential hypertension 04/10/2021   SIRS (systemic inflammatory response syndrome) (HCC) 04/10/2021   Hypokalemia, inadequate intake 04/10/2021   Reactive thrombocytosis 04/10/2021   Prolonged QT interval 04/10/2021   Diverticulitis of colon with perforation 11/04/2020   Anemia 07/11/2019   Inappropriate sinus tachycardia (HCC) 07/11/2019   Obstructive sleep apnea 11/27/2018   Morbid obesity with body mass index of 60.0-69.9 in adult [facility]) 07/31/2018   Class 3 severe obesity due to excess calories with  serious comorbidity and body mass index (BMI) of 50.0 to 59.9 in adult    SVD (spontaneous vaginal delivery) 12/16/2012    REFERRING PROVIDER: Dr [name]  REFERRING DIAG: Back pain, Deconditioning  Rationale for Evaluation and Treatment: Rehabilitation  THERAPY DIAG:  Malignant neoplasm of colon, unspecified part of colon (HCC)  Muscle weakness (generalized)  Other low back pain  ONSET DATE: 2024  SUBJECTIVE:                                                                                                                                                                                           SUBJECTIVE STATEMENT: I felt really good after I left here. I actually went to [employer] after as well. My back was spasming but after therapy it felt a lot better. I also just went to [employer] again before I came here.    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 A CT in 06/2021 revealed metastatic disease in left psoas, abdominal wall and pertitoneal areas. She underwent chemo therapy at that time. She is currently on Keytruda  and her last guardant reveal on 10/13/2023 was negative. She has a LUQ colostomy. She has had some anemia believed to be due to heavy menstrual bleeding. She has ongoing body and back pain with some resulting from immunotherapy.  PAIN:  Are you having pain? Yes: NPRS scale: 3/10 Pain location: Bilateral entire leg Pain description: burning in back and sometimes moves into posterior thighs Aggravating factors: sitting too long, standing more than 10 min, walking 10 min Relieving factors: change activities,   PRECAUTIONS: hx of colon cancer with colostomy  RED FLAGS: Bowel or bladder incontinence: Yes: colostomy  Has a waterproof colostomy bag she will try and see how she does. WEIGHT BEARING  RESTRICTIONS: No  FALLS:  Has patient fallen in last  6 months? No  LIVING ENVIRONMENT: Lives with: lives with their spouse and 3 children, 5, 72, and 70 Lives in: House/apartment Stairs: Yes: Internal: 14 steps; on right going up and External: 4 steps; none Has following equipment at home: Quad cane small base, getting a shower bench  OCCUPATION: not working  PLOF: Independent with basic ADLsneeds help with socks, shoes, bathing,  PATIENT GOALS: Decrease, Be able to be more active     OBJECTIVE:  Note: Objective measures were completed at Evaluation unless otherwise noted.  DIAGNOSTIC FINDINGS:  No lytic disease noted with CT abdomen  PATIENT SURVEYS:  Modified Oswestry:  MODIFIED OSWESTRY DISABILITY SCALE  Date: 01/23/2024 Score  Pain intensity 5 =  Pain medication has no effect on my pain.  2. Personal care (washing, dressing, etc.) 3 =  I need help, but I am able to manage most of my personal care.  3. Lifting 2 = Pain prevents me from lifting heavy weights off the floor, activities (eg. sports, dancing). but I can manage if the weights are conveniently positioned (3) Pain prevents me from going out very often. (eg, on a table).  4. Walking 3 =  Pain prevents me from walking more than  mile.  5. Sitting 2 =  Pain prevents me from sitting more than 1 hour.  6. Standing 4 =  Pain prevents me from standing more than 10 minutes.  7. Sleeping 3 =  Even when I take pain medication, I sleep less than 4 hours.  8. Social Life 3 =  Pain prevents me from going out very often.  9. Traveling 2 =  My pain restricts my travel over 2 hours.  10. Employment/ Homemaking 3 = Pain prevents me from doing anything but light duties.  Total 30/50 =60%   Interpretation of scores: Score Category Description  0-20% Minimal Disability The patient can cope with most living activities. Usually no treatment is indicated apart from advice on lifting, sitting and exercise  21-40% Moderate Disability  The patient experiences more pain and difficulty with sitting, lifting and standing. Travel and social life are more difficult and they may be disabled from work. Personal care, sexual activity and sleeping are not grossly affected, and the patient can usually be managed by conservative means  41-60% Severe Disability Pain remains the main problem in this group, but activities of daily living are affected. These patients require a detailed investigation  61-80% Crippled Back pain impinges on all aspects of the patient's life. Positive intervention is required  81-100% Bed-bound  These patients are either bed-bound or exaggerating their symptoms  Bluford FORBES Zoe DELENA Karon DELENA, et al. Surgery versus conservative management of stable thoracolumbar fracture: the PRESTO feasibility RCT. [place]): [employer]; 2021 Nov. [facility] Technology Assessment, No. 25.62.) Appendix 3, Oswestry Disability Index category descriptors. Available from: FindJewelers.cz  Minimally Clinically Important Difference (MCID) = 12.8%    LEFS: 7/80  COGNITION: Overall cognitive status: Within functional limits for tasks assessed     SENSATION: CIPN fingertips, toes  MUSCLE LENGTH: Hamstrings: Right 70 deg; Left 55 deg Thomas test: Right - ; Left pain in back with left leg extended and Right knee to chest FABER; Felt good on back both legs done separately, mildly restricted bilaterally  POSTURE: rounded shoulders, forward head, increased lumbar lordosis, and anterior pelvic tilt, bilateral genu valgus  PALPATION: Right iliac high  LUMBAR ROM:   AROM eval  Flexion  Limited by body habitus Can reach hands several  inches Above knees worse  Extension WFL better  Right lateral flexion Increased NW, reach to 1-2 inches above knee  Left lateral flexion Better, reach 1-2 inches above knee  Right rotation   Left rotation    (Blank rows = not tested)  LOWER EXTREMITY   MMT    STRENGTH  Right eval Left eval  Hip flexion 3+, trembles 4-  Hip extension    Hip abduction Seated 4/5 4+  Hip adduction    Hip internal rotation 4 4  Hip external rotation 4+ 4+  Knee flexion 5 5  Knee extension 4- 4  Ankle dorsiflexion 4+ 4+  Ankle plantarflexion    Ankle inversion    Ankle eversion     (Blank rows = not tested)     (Blank rows = not tested)  LUMBAR SPECIAL TESTS:  Straight leg raise test: Negative and FABER test: Negative  FUNCTIONAL TESTS:  30 second sit to stand;    TREATMENT DATE:  02/15/24: Nu Step LE 8, UE 8 Lev 6 x 8 min 705 steps At end of session with UE's on large green ball on mat table with rolling ball forward for low back stretch x 10, then same with adding roll ball to Lt for Rt low back stretch where spasms mostly felt x 5, pt reports good stretches with these HS stretch on power plate x 30 sec each Neuro Re Ed In // bars: Front and retro heel-toe walking x 4 laps each, slow, controlled high knee marching x 2 laps; bil sidestepping  with mini squat x 2 laps HS Stretch standing in // bars with foot on 6 step x 2 reps, 30 sec each Marching standing on purple airex x 5 each with focus on core engagement Therapeutic Activities Power Plate for heel raises x 30 sec, then mini squats focusing on core engagement at end motions x 30 sec each Supine ppt x 12, then ppt with: low march 2 x 5, and clam (without resistance today as pt just went to the gym) x 10; LTR stretches between sets  02/13/2024 Nu Step LE 7, UE 6 Lev 6 x 8 min 654 steps Incline stretch 3 x 25 sec at // bars Mini squats x 10 holding // bars Marching x 10 ea with HH, emphasis on core Heel raises x 10 in //, emphasis on core Tandem stance B x 30 sec no LOB Step onto ax x 5 B forward and 5 B sideways emphasis on core DLS on ax x 30 sec increased pain, discontinued HS stretches seated with leg on 6 in step 3 x 30 B Figure 4 stretch x 2 B 30 sec Ppt with low march x 10,  emphasis on core Supine clam yellow x 10 with ppt and emphasis on core LTR x 5-6 B Printed aquatic instructions and suggested pt go look at the pool and see if she can tolerate the distance walked to get to pool,knowing she can stop and rest at chairs on way in. Also discussed she may want to talk with therapist and explain she needs to check her colostomy bag periodically.  02/09/24 NuStep Resistance 6, LE 7/ UE 6 x 10:43 mins with 849 steps Reviewed HEP with performance of each:   LTR 3 x 6 bil   Manual Hamstring stretches: bil 2x30   PPT/TrA with reminder of cueing x 10   With ball squeeze 4 x 10    With mini march x 6bil  (Increased pain)  Sidelying abduction x 10 bil  Bil heel raises x 10 with VC's Mini squat x 10   Bil tandem stance x 30 sec each  Bil SLS x 30 sec each, VC's throughout all to relax shoulders and rely on UE support as little as possible  Large ball roll out at the middle, left, and right 2x20  Mermaid stretch x 2 bil  Calf stretches 2x20 bil      PATIENT EDUCATION:  Education details: Bil LE strength, flexibility, balance, and core strength  Person educated: Patient and Spouse Education method: Explanation, Demonstration, and Handouts Education comprehension: verbalized understanding and returned demonstration, tactile and VC's during and will benefit from review to assess technique  HOME EXERCISE PROGRAM: Standing BB, checking pain before and after and stopping if pain is increased Towel roll for sitting posture 02/01/24 - Bil LE strength, flexibility, and core strength  ASSESSMENT:  CLINICAL IMPRESSION: Pt  just left [employer] and reports not having any LBP initially today. Some increase towards end of session but reports stretches done at end, especially with UE's on large green ball felt very beneficial. She reports overall legs are starting to feel stronger and she is feeling confident being able to start going back to gym for short periods  of time. She was able to increase steps on NuStep showing improved endurance.     OBJECTIVE IMPAIRMENTS: Abnormal gait, decreased activity tolerance, decreased balance, decreased knowledge of condition, decreased mobility, difficulty walking, decreased ROM, decreased strength, increased muscle spasms, improper body mechanics, postural dysfunction, obesity, and pain.   ACTIVITY LIMITATIONS: lifting, bending, sitting, standing, squatting, sleeping, stairs, bed mobility, bathing, dressing, locomotion level, and caring for others  PARTICIPATION LIMITATIONS: meal prep, cleaning, laundry, shopping, and community activity  PERSONAL FACTORS: Past/current experiences and 3+ comorbidities: colon cancer, chemotherapy, obesity are also affecting patient's functional outcome.   REHAB POTENTIAL: Good  CLINICAL DECISION MAKING: Evolving/moderate complexity  EVALUATION COMPLEXITY: Moderate   GOALS: Goals reviewed with patient? Yes  SHORT TERM GOALS: Target date: 02/20/2024  Pt will be independent in a HEP for ROM, UE and LE and core strength Baseline: Goal status: INITIAL  2.  Pt will have decreased Back pain by 25% for improved tolerance for home activities Baseline:  Goal status: INITIAL  3.  Pt will be able to walk 10-15 min with several rest periods with min LBP Baseline:  Goal status: INITIAL  4.  Pt will be tolerant of aquatic therapy as indicated (has waterproof pouch that works) Baseline:  Goal status: INITIAL   LONG TERM GOALS: Target date: 03/19/2024 Pt will have decreased back pain by 50% or greater for improved ability to tolerate home activities Baseline:  Goal status: INITIAL  2.  ODI will be no greater than 30%  to demonstrate improved function Baseline: 60% Goal status: INITIAL  3.  Pt will be able to walk 8/10 of a mile with rest periods as done previously with minimal LBP Baseline:  Goal status: INITIAL  4.  Pt will be able to stand long enough to fix small meal  without LBP Baseline:  Goal status: INITIAL  5.  Pt will be educated in proper body mechanics for home activities Baseline:  Goal status: INITIAL  6.  Pt will be able to go up stairs with alternate gait pattern Baseline:  Goal status: INITIAL  PLAN:  PT FREQUENCY: 2x/week  PT DURATION: 8 weeks  PLANNED INTERVENTIONS: 97164- PT Re-evaluation, 97110-Therapeutic exercises, 97530- Therapeutic activity, [id number]- Neuromuscular re-education, 97535- Self  Care, 02859- Manual therapy, [id number]- Gait training, [id number]- Aquatic Therapy, Cryotherapy, and Moist heat.  PLAN FOR NEXT SESSION: Do TUG, review HEP, check back pain and understanding of exs, UE, LE, core strength, aquatics 1x per week in place of 1 clinic therapy if colostomy pouch is waterproof, body mechanics education, CP, MH prn   Aden Berwyn Caldron, PTA 02/15/2024, 4:59 PM          Cancer Rehab [phone] HIP: Flexion Standing    Holding onto counter lift leg straight in front keeping knee straight. Slow and controlled! _10__ reps per set, _2-3__ sets per day. Then repeat with other leg.   Hip Extension (Standing)    Stand with support at counter. Squeeze pelvic floor and hold so as not to twist hips and don't lean forward.  Move right leg backward with straight knee. Slow and controlled! Repeat _10__ times. Do _2-3__ times a day. Repeat with other leg.  Hip Abduction (Standing)    Stand with support. Squeeze pelvic floor and hold. Lift right leg out to side, keeping toe forward.  Repeat _10__ times. Do _2-3__ times a day. Repeat with other leg.   Heel Raise: Bilateral (Standing)      Stand near counter for fingertip support if needed. Rise on balls of feet. Repeat __10-20__ times per set. Do _1-2___ sets per session. Do __2__ sessions per day.  SINGLE LIMB STANCE    Stand at counter (corner if you have one) for minimal arm support. Raise leg. Hold _10-20__ seconds. Repeat with other leg. Once this becomes  easier, for increased challenge, close eyes with fingertips on counter for safety. _3-5__ reps per set, _2-3__ sets per day.  Tandem Stance    Stand at counter (corner if you have one). Right foot in front of left, heel touching toe both feet straight ahead. Stand on Foot Triangle of Support with both feet. Balance in this position _10-20__ seconds. Do with left foot in front of right. Once this becomes easier, for increased challenge, close eyes with fingertips on counter for safety.  Mini Squat: Double Leg    Standing at counter. With feet shoulder width apart, reach forward for balance and do a mini squat. Keep knees in line with second toe. Knees do not go past toes. Repeat _5__ times per set. Do __2_ sets per session.  PELVIC TILT: Posterior    Tighten abdominals, flatten low back. _10__ reps per set, __1-2_ sets per day.  Tilt with : Clams  Alternate mini march Pillow squeeze   2-3 sets of 5 reps  Stretches  Low trunk rotation laying on bed Hamstring stretch seated at edge of chair with knee straight and lean forward leading with chest Standing at dining room table, place foot in chair behind you and stand tall for stretch in thigh.  Hold 20-30 sec, 2-3 reps throughout day

## 2024-02-16 ENCOUNTER — Encounter: Payer: Self-pay | Admitting: Hematology

## 2024-02-16 ENCOUNTER — Inpatient Hospital Stay: Payer: Self-pay | Attending: Hematology

## 2024-02-16 ENCOUNTER — Inpatient Hospital Stay: Attending: Hematology

## 2024-02-16 ENCOUNTER — Inpatient Hospital Stay

## 2024-02-16 VITALS — BP 122/86 | HR 91 | Temp 98.3°F | Resp 19

## 2024-02-16 DIAGNOSIS — R911 Solitary pulmonary nodule: Secondary | ICD-10-CM | POA: Diagnosis not present

## 2024-02-16 DIAGNOSIS — Z7982 Long term (current) use of aspirin: Secondary | ICD-10-CM | POA: Insufficient documentation

## 2024-02-16 DIAGNOSIS — K760 Fatty (change of) liver, not elsewhere classified: Secondary | ICD-10-CM | POA: Diagnosis not present

## 2024-02-16 DIAGNOSIS — Z1509 Genetic susceptibility to other malignant neoplasm: Secondary | ICD-10-CM | POA: Insufficient documentation

## 2024-02-16 DIAGNOSIS — D259 Leiomyoma of uterus, unspecified: Secondary | ICD-10-CM | POA: Insufficient documentation

## 2024-02-16 DIAGNOSIS — R222 Localized swelling, mass and lump, trunk: Secondary | ICD-10-CM | POA: Diagnosis not present

## 2024-02-16 DIAGNOSIS — Z791 Long term (current) use of non-steroidal anti-inflammatories (NSAID): Secondary | ICD-10-CM | POA: Insufficient documentation

## 2024-02-16 DIAGNOSIS — C786 Secondary malignant neoplasm of retroperitoneum and peritoneum: Secondary | ICD-10-CM | POA: Insufficient documentation

## 2024-02-16 DIAGNOSIS — C187 Malignant neoplasm of sigmoid colon: Secondary | ICD-10-CM | POA: Diagnosis not present

## 2024-02-16 DIAGNOSIS — Z79899 Other long term (current) drug therapy: Secondary | ICD-10-CM | POA: Insufficient documentation

## 2024-02-16 DIAGNOSIS — D126 Benign neoplasm of colon, unspecified: Secondary | ICD-10-CM | POA: Insufficient documentation

## 2024-02-16 DIAGNOSIS — K5720 Diverticulitis of large intestine with perforation and abscess without bleeding: Secondary | ICD-10-CM | POA: Insufficient documentation

## 2024-02-16 DIAGNOSIS — R180 Malignant ascites: Secondary | ICD-10-CM | POA: Insufficient documentation

## 2024-02-16 DIAGNOSIS — Z933 Colostomy status: Secondary | ICD-10-CM | POA: Diagnosis not present

## 2024-02-16 DIAGNOSIS — K6819 Other retroperitoneal abscess: Secondary | ICD-10-CM | POA: Diagnosis not present

## 2024-02-16 DIAGNOSIS — N83201 Unspecified ovarian cyst, right side: Secondary | ICD-10-CM | POA: Insufficient documentation

## 2024-02-16 DIAGNOSIS — J90 Pleural effusion, not elsewhere classified: Secondary | ICD-10-CM | POA: Diagnosis not present

## 2024-02-16 DIAGNOSIS — D3502 Benign neoplasm of left adrenal gland: Secondary | ICD-10-CM | POA: Insufficient documentation

## 2024-02-16 DIAGNOSIS — I2510 Atherosclerotic heart disease of native coronary artery without angina pectoris: Secondary | ICD-10-CM | POA: Insufficient documentation

## 2024-02-16 DIAGNOSIS — C7951 Secondary malignant neoplasm of bone: Secondary | ICD-10-CM | POA: Diagnosis not present

## 2024-02-16 DIAGNOSIS — Z1501 Genetic susceptibility to malignant neoplasm of breast: Secondary | ICD-10-CM | POA: Insufficient documentation

## 2024-02-16 DIAGNOSIS — M2550 Pain in unspecified joint: Secondary | ICD-10-CM | POA: Diagnosis not present

## 2024-02-16 DIAGNOSIS — K8020 Calculus of gallbladder without cholecystitis without obstruction: Secondary | ICD-10-CM | POA: Diagnosis not present

## 2024-02-16 DIAGNOSIS — J9811 Atelectasis: Secondary | ICD-10-CM | POA: Insufficient documentation

## 2024-02-16 DIAGNOSIS — E278 Other specified disorders of adrenal gland: Secondary | ICD-10-CM | POA: Diagnosis not present

## 2024-02-16 DIAGNOSIS — N920 Excessive and frequent menstruation with regular cycle: Secondary | ICD-10-CM | POA: Diagnosis not present

## 2024-02-16 DIAGNOSIS — D509 Iron deficiency anemia, unspecified: Secondary | ICD-10-CM

## 2024-02-16 DIAGNOSIS — D500 Iron deficiency anemia secondary to blood loss (chronic): Secondary | ICD-10-CM

## 2024-02-16 DIAGNOSIS — Z5112 Encounter for antineoplastic immunotherapy: Secondary | ICD-10-CM | POA: Insufficient documentation

## 2024-02-16 DIAGNOSIS — Z452 Encounter for adjustment and management of vascular access device: Secondary | ICD-10-CM | POA: Diagnosis not present

## 2024-02-16 DIAGNOSIS — Z515 Encounter for palliative care: Secondary | ICD-10-CM

## 2024-02-16 LAB — CMP (CANCER CENTER ONLY)
ALT: 12 U/L (ref 0–44)
AST: 13 U/L — ABNORMAL LOW (ref 15–41)
Albumin: 4.5 g/dL (ref 3.5–5.0)
Alkaline Phosphatase: 53 U/L (ref 38–126)
Anion gap: 7 (ref 5–15)
BUN: 12 mg/dL (ref 6–20)
CO2: 22 mmol/L (ref 22–32)
Calcium: 9 mg/dL (ref 8.9–10.3)
Chloride: 109 mmol/L (ref 98–111)
Creatinine: 0.8 mg/dL (ref 0.44–1.00)
GFR, Estimated: >60 mL/min (ref >60)
Glucose, Bld: 104 mg/dL — ABNORMAL HIGH (ref 70–99)
Potassium: 3.6 mmol/L (ref 3.5–5.1)
Sodium: 138 mmol/L (ref 135–145)
Total Bilirubin: 0.4 mg/dL (ref 0.0–1.2)
Total Protein: 8.3 g/dL — ABNORMAL HIGH (ref 6.5–8.1)

## 2024-02-16 LAB — CBC WITH DIFFERENTIAL (CANCER CENTER ONLY)
Abs Immature Granulocytes: 0.03 K/uL (ref 0.00–0.07)
Basophils Absolute: 0 K/uL (ref 0.0–0.1)
Basophils Relative: 1 %
Eosinophils Absolute: 0.1 K/uL (ref 0.0–0.5)
Eosinophils Relative: 1 %
HCT: 35.6 % — ABNORMAL LOW (ref 36.0–46.0)
Hemoglobin: 11.4 g/dL — ABNORMAL LOW (ref 12.0–15.0)
Immature Granulocytes: 0 %
Lymphocytes Relative: 24 %
Lymphs Abs: 1.8 K/uL (ref 0.7–4.0)
MCH: 25.6 pg — ABNORMAL LOW (ref 26.0–34.0)
MCHC: 32 g/dL (ref 30.0–36.0)
MCV: 79.8 fL — ABNORMAL LOW (ref 80.0–100.0)
Monocytes Absolute: 0.4 K/uL (ref 0.1–1.0)
Monocytes Relative: 5 %
Neutro Abs: 5.1 K/uL (ref 1.7–7.7)
Neutrophils Relative %: 69 %
Platelet Count: 498 K/uL — ABNORMAL HIGH (ref 150–400)
RBC: 4.46 MIL/uL (ref 3.87–5.11)
RDW: 15.6 % — ABNORMAL HIGH (ref 11.5–15.5)
WBC Count: 7.4 K/uL (ref 4.0–10.5)
nRBC: 0 % (ref 0.0–0.2)

## 2024-02-16 MED ORDER — ALTEPLASE 2 MG IJ SOLR
2.0000 mg | Freq: Once | INTRAMUSCULAR | Status: AC | PRN
  Administered 2024-02-16: 2 mg
  Filled 2024-02-16: qty 2

## 2024-02-16 MED ORDER — SODIUM CHLORIDE 0.9 % IV SOLN
200.0000 mg | Freq: Once | INTRAVENOUS | Status: AC
  Administered 2024-02-16: 200 mg via INTRAVENOUS
  Filled 2024-02-16: qty 200

## 2024-02-16 MED ORDER — SODIUM CHLORIDE 0.9 % IV SOLN: Freq: Once | INTRAVENOUS | Status: AC

## 2024-02-16 NOTE — Patient Instructions (Signed)
 [facility] - A DEPT OF [facility]  Discharge Instructions: Thank you for choosing [facility] to provide your oncology and hematology care.   If you have a lab appointment with the Cancer Center, please go directly to the Cancer Center and check in at the registration area.   Wear comfortable clothing and clothing appropriate for easy access to any Portacath or PICC line.   We strive to give you quality time with your provider. You may need to reschedule your appointment if you arrive late (15 or more minutes).  Arriving late affects you and other patients whose appointments are after yours.  Also, if you miss three or more appointments without notifying the office, you may be dismissed from the clinic at the provider's discretion.      For prescription refill requests, have your pharmacy contact our office and allow 72 hours for refills to be completed.    Today you received the following chemotherapy and/or immunotherapy agents: pembrolizumab [facility])      To help prevent nausea and vomiting after your treatment, we encourage you to take your nausea medication as directed.  BELOW ARE SYMPTOMS THAT SHOULD BE REPORTED IMMEDIATELY: *FEVER GREATER THAN 100.4 F (38 C) OR HIGHER *CHILLS OR SWEATING *NAUSEA AND VOMITING THAT IS NOT CONTROLLED WITH YOUR NAUSEA MEDICATION *UNUSUAL SHORTNESS OF BREATH *UNUSUAL BRUISING OR BLEEDING *URINARY PROBLEMS (pain or burning when urinating, or frequent urination) *BOWEL PROBLEMS (unusual diarrhea, constipation, pain near the anus) TENDERNESS IN MOUTH AND THROAT WITH OR WITHOUT PRESENCE OF ULCERS (sore throat, sores in mouth, or a toothache) UNUSUAL RASH, SWELLING OR PAIN  UNUSUAL VAGINAL DISCHARGE OR ITCHING   Items with * indicate a potential emergency and should be followed up as soon as possible or go to the Emergency Department if any problems should occur.  Please show the CHEMOTHERAPY ALERT CARD or  IMMUNOTHERAPY ALERT CARD at check-in to the Emergency Department and triage nurse.  Should you have questions after your visit or need to cancel or reschedule your appointment, please contact [facility] - A DEPT OF [facility]  Dept: [phone]  and follow the prompts.  Office hours are 8:00 a.m. to 4:30 p.m. Monday - Friday. Please note that voicemails left after 4:00 p.m. may not be returned until the following business day.  We are closed weekends and major holidays. You have access to a nurse at all times for urgent questions. Please call the main number to the clinic Dept: [phone] and follow the prompts.   For any non-urgent questions, you may also contact your provider using MyChart. We now offer e-Visits for anyone 31 and older to request care online for non-urgent symptoms. For details visit [email or web address].   Also download the MyChart app! Go to the app store, search "MyChart", open the app, select [facility], and log in with your MyChart username and password.

## 2024-02-17 ENCOUNTER — Ambulatory Visit (HOSPITAL_COMMUNITY)
Admission: RE | Admit: 2024-02-17 | Discharge: 2024-02-17 | Disposition: A | Discharge status: Home / Self Care | Source: Ambulatory Visit | Attending: Hematology | Admitting: Hematology

## 2024-02-17 DIAGNOSIS — C801 Malignant (primary) neoplasm, unspecified: Secondary | ICD-10-CM | POA: Diagnosis not present

## 2024-02-17 DIAGNOSIS — K828 Other specified diseases of gallbladder: Secondary | ICD-10-CM | POA: Diagnosis not present

## 2024-02-17 DIAGNOSIS — C187 Malignant neoplasm of sigmoid colon: Secondary | ICD-10-CM | POA: Diagnosis not present

## 2024-02-17 DIAGNOSIS — C189 Malignant neoplasm of colon, unspecified: Secondary | ICD-10-CM | POA: Diagnosis not present

## 2024-02-17 LAB — FERRITIN: Ferritin: 15 ng/mL (ref 11–307)

## 2024-02-17 LAB — CEA (ACCESS): CEA (CHCC): <1 ng/mL (ref 0.00–5.00)

## 2024-02-17 MED ORDER — IOHEXOL 300 MG/ML  SOLN
100.0000 mL | Freq: Once | INTRAMUSCULAR | Status: AC | PRN
  Administered 2024-02-17: 100 mL via INTRAVENOUS

## 2024-02-21 ENCOUNTER — Ambulatory Visit: Admitting: Rehabilitation

## 2024-02-22 DIAGNOSIS — Z933 Colostomy status: Secondary | ICD-10-CM | POA: Diagnosis not present

## 2024-02-23 ENCOUNTER — Ambulatory Visit: Admitting: Rehabilitation

## 2024-02-27 ENCOUNTER — Encounter: Payer: Self-pay | Admitting: Hematology

## 2024-02-28 ENCOUNTER — Other Ambulatory Visit: Payer: Self-pay | Admitting: Hematology

## 2024-02-28 ENCOUNTER — Other Ambulatory Visit: Payer: Self-pay

## 2024-02-28 ENCOUNTER — Ambulatory Visit

## 2024-02-28 DIAGNOSIS — M6281 Muscle weakness (generalized): Secondary | ICD-10-CM | POA: Diagnosis not present

## 2024-02-28 DIAGNOSIS — M5459 Other low back pain: Secondary | ICD-10-CM

## 2024-02-28 DIAGNOSIS — C189 Malignant neoplasm of colon, unspecified: Secondary | ICD-10-CM

## 2024-02-28 MED ORDER — ACETAMINOPHEN-CODEINE 300-30 MG PO TABS: 1.0000 | ORAL_TABLET | Freq: Three times a day (TID) | ORAL | 0 refills | Status: DC | PRN

## 2024-02-28 NOTE — Assessment & Plan Note (Signed)
-  MMR loss of [name]6, [name] with high mutation burden, acquired BRCA mutation (+)  --Initially presented with diarrhea 11/04/20, found to have diverticulitis with perforation and also developed an abscess requiring 2 drains, multiple hospital stay  --she started FOLFOX on 06/22/21 but did not respond -she switched to Keytruda  on 09/08/21. She has been tolerating well overall but has developed joint pain which could be related, overall manageable. She is clinically doing better also with more energy and less abdominal pain   -her CEA has normalized on immunotherapy. -CT CAP on 03/15/22 again showed mixed response -she had PET scan on 04/26/2022 for evaluation of her ovarian cysts, and saw GYN Dr. Daisey Dryer, we decided to monitor it. -She tolerating Keytruda  well, with mild joint pain.  She recently started workout at the gym, which has helped her joint pain. -restaging CT from 08/04/2022 showed stable disease in left abdomen and pelvis, no new lesions.  I personally reviewed her scan images, and discussed findings with patient.   -Will continue Keytruda   -repeated CT scan on January 24, 2023 showed continuous response, no new lesions.  -PET on 05/04/2023 showed no definitive evidence of progression, some mildly hypermetabolic nodes, will continue monitoring, plan to stop Keytruda  in 09/2023 if no progression on next scan  -PET 08/29/2023 showed similar tiny left pericolic hypermetabolic nodules, most consistent with metastatic disease, no other definitive evidence of residual disease  -ctDNA GuardantReveal was negative on 10/13/2023

## 2024-02-28 NOTE — Therapy (Cosign Needed)
 OUTPATIENT PHYSICAL THERAPY THORACOLUMBAR TREATMENT   Patient Name: Mikayla Rios MRN: [id number] DOB:Mar 01, 1986, 38 y.o., female Today's Date: 02/28/2024  END OF SESSION:  PT End of Session - 02/28/24 1715     Visit Number 7    Number of Visits 16    Date for Recertification  03/19/24    Authorization Type None needed    Authorization Time Period 30 visits/year    Authorization - Visit Number 7    Authorization - Number of Visits 30    PT Start Time 1602    PT Stop Time 1656    PT Time Calculation (min) 54 min    Activity Tolerance Patient limited by pain    Behavior During Therapy [facility] for tasks assessed/performed            Past Medical History:  Diagnosis Date   Allergy    seasonal   Anemia    Blood infection 1985   Blood transfusion without reported diagnosis    colon ca 04/2021   Diverticulitis    Family history of breast cancer    Family history of colon cancer    Family history of colon cancer    Family history of stomach cancer    Hypertension    Obesity    Sleep apnea    Past Surgical History:  Procedure Laterality Date   COLECTOMY WITH COLOSTOMY CREATION/HARTMANN PROCEDURE N/A 04/15/2021   Procedure: COLOSTOMY CREATION/HARTMANN PROCEDURE;  Surgeon: Mikayla Standing, MD;  Location: [facility];  Service: General;  Laterality: N/A;   IR CATHETER TUBE CHANGE  12/12/2020   IR CATHETER TUBE CHANGE  01/27/2021   IR CATHETER TUBE CHANGE  02/19/2021   IR CATHETER TUBE CHANGE  04/13/2021   IR IMAGING GUIDED PORT INSERTION  10/02/2021   IR RADIOLOGIST EVAL & MGMT  11/27/2020   IR RADIOLOGIST EVAL & MGMT  12/11/2020   IR RADIOLOGIST EVAL & MGMT  01/07/2021   IR RADIOLOGIST EVAL & MGMT  02/04/2021   IR SINUS/FIST TUBE CHK-NON GI  12/12/2020   IR SINUS/FIST TUBE CHK-NON GI  02/19/2021   LAPAROSCOPIC PARTIAL COLECTOMY N/A 04/15/2021   Procedure: LAPAROSCOPIC ASSISTED HARTMANN RESECTION;  Surgeon: Mikayla Standing, MD;  Location: [facility];  Service: General;  Laterality: N/A;    Patient Active Problem List   Diagnosis Date Noted   Obesity due to excess calories with body mass index (BMI) 120% of 95th percentile to less than 140% of 95th percentile for age in pediatric patient 11/15/2023   Viral upper respiratory tract infection 08/07/2023   Myalgia 03/29/2023   Chronic midline thoracic back pain 03/29/2023   Macromastia 03/29/2023   Lower extremity edema 03/29/2023   Urinary tract infection without hematuria 12/02/2022   Arthralgia 10/13/2022   Nerve pain 09/20/2022   Insomnia due to medical condition 09/20/2022   Irritant contact dermatitis associated with fecal stoma 07/14/2022   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   Anemia, iron  deficiency 02/22/2022   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) 12/21/2021   Elevated troponin 10/07/2021   H/O colon cancer, stage II 10/07/2021   Fever 10/07/2021   Palliative care patient 08/17/2021   PICC (peripherally inserted central catheter) in place 08/17/2021   Genetic testing 08/04/2021   FAP (familial adenomatous polyposis) 08/04/2021   Family history of colon cancer 06/29/2021   Family history of breast cancer 06/29/2021   Family history of stomach cancer 06/29/2021   Prolonged Q-T interval on ECG    Hypokalemia    Acute blood loss anemia  Sinus tachycardia    Debility 05/01/2021   Malignant neoplasm of sigmoid colon (HCC)    Diverticulitis of large intestine with abscess 04/10/2021   Essential hypertension 04/10/2021   SIRS (systemic inflammatory response syndrome) (HCC) 04/10/2021   Hypokalemia, inadequate intake 04/10/2021   Reactive thrombocytosis 04/10/2021   Prolonged QT interval 04/10/2021   Diverticulitis of colon with perforation 11/04/2020   Anemia 07/11/2019   Inappropriate sinus tachycardia 07/11/2019   Obstructive sleep apnea 11/27/2018   Morbid obesity with body mass index of 60.0-69.9 in adult [facility]) 07/31/2018   Class 3 severe obesity due to excess calories with serious comorbidity and  body mass index (BMI) of 50.0 to 59.9 in adult    SVD (spontaneous vaginal delivery) 12/16/2012    REFERRING PROVIDER: Dr Mikayla Rios  REFERRING DIAG: Back pain, Deconditioning  Rationale for Evaluation and Treatment: Rehabilitation  THERAPY DIAG:  Malignant neoplasm of colon, unspecified part of colon (HCC)  Muscle weakness (generalized)  Other low back pain  ONSET DATE: 2024  SUBJECTIVE:                                                                                                                                                                                           SUBJECTIVE STATEMENT: I felt a little sore and have been having cramping in my back and legs today. My daughter had to help me put on my shoes this morning because of the cramps. I just left the gym where I did the NuStep for 30 minutes. At home, I have to walk up the stairs with my right leg and then down the stairs with my left. I would like to build the strength in my leg to be able to walk up and down the stairs normally without being afraid of falling.    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 A CT in 06/2021 revealed metastatic disease in left psoas, abdominal wall and pertitoneal areas. She underwent chemo therapy at that time. She is currently on Keytruda  and her last guardant reveal on 10/13/2023 was negative. She has a LUQ colostomy. She has had some anemia believed to be due to heavy menstrual bleeding. She has ongoing body and back pain with some resulting from immunotherapy.  PAIN:  Are you having pain? Yes: NPRS scale: 4/10 Pain location: Lower back & bilateral entire leg Pain description: burning in back and sometimes moves into posterior thighs Aggravating factors: sitting too long, Rios more than 10  min, walking 10 min Relieving factors: change  activities,   PRECAUTIONS: hx of colon cancer with colostomy  RED FLAGS: Bowel or bladder incontinence: Yes: colostomy  Has a waterproof colostomy bag she will try and see how she does. WEIGHT BEARING RESTRICTIONS: No  FALLS:  Has patient fallen in last 6 months? No  LIVING ENVIRONMENT: Lives with: lives with their spouse and 3 children, 39, 23, and 77 Lives in: House/apartment Stairs: Yes: Internal: 14 steps; on right going up and External: 4 steps; none Has following equipment at home: Quad cane small base, getting a shower bench  OCCUPATION: not working  PLOF: Independent with basic ADLsneeds help with socks, shoes, bathing,  PATIENT GOALS: Decrease, Be able to be more active     OBJECTIVE:  Note: Objective measures were completed at Evaluation unless otherwise noted.  DIAGNOSTIC FINDINGS:  No lytic disease noted with CT abdomen  PATIENT SURVEYS:  Modified Oswestry:  MODIFIED OSWESTRY DISABILITY SCALE  Date: 01/23/2024 Score  Pain intensity 5 =  Pain medication has no effect on my pain.  2. Personal care (washing, dressing, etc.) 3 =  I need help, but I am able to manage most of my personal care.  3. Lifting 2 = Pain prevents me from lifting heavy weights off the floor, activities (eg. sports, dancing). but I can manage if the weights are conveniently positioned (3) Pain prevents me from going out very often. (eg, on a table).  4. Walking 3 =  Pain prevents me from walking more than  mile.  5. Sitting 2 =  Pain prevents me from sitting more than 1 hour.  6. Rios 4 =  Pain prevents me from Rios more than 10 minutes.  7. Sleeping 3 =  Even when I take pain medication, I sleep less than 4 hours.  8. Social Life 3 =  Pain prevents me from going out very often.  9. Traveling 2 =  My pain restricts my travel over 2 hours.  10. Employment/ Homemaking 3 = Pain prevents me from doing anything but light duties.  Total 30/50 =60%   Interpretation of scores: Score  Category Description  0-20% Minimal Disability The patient can cope with most living activities. Usually no treatment is indicated apart from advice on lifting, sitting and exercise  21-40% Moderate Disability The patient experiences more pain and difficulty with sitting, lifting and Rios. Travel and social life are more difficult and they may be disabled from work. Personal care, sexual activity and sleeping are not grossly affected, and the patient can usually be managed by conservative means  41-60% Severe Disability Pain remains the main problem in this group, but activities of daily living are affected. These patients require a detailed investigation  61-80% Crippled Back pain impinges on all aspects of the patient's life. Positive intervention is required  81-100% Bed-bound  These patients are either bed-bound or exaggerating their symptoms  Bluford FORBES Zoe DELENA Karon DELENA, et al. Surgery versus conservative management of stable thoracolumbar fracture: the PRESTO feasibility RCT. [place]): [employer]; 2021 Nov. [facility] Technology Assessment, No. 25.62.) Appendix 3, Oswestry Disability Index category descriptors. Available from: [employer]  Minimally Clinically Important Difference (MCID) = 12.8%    LEFS: 7/80  COGNITION: Overall cognitive status: Within functional limits for tasks assessed     SENSATION: CIPN fingertips, toes  MUSCLE LENGTH: Hamstrings: Right 70 deg; Left 55 deg Thomas test: Right - ; Left pain in back with left leg extended and Right knee  to chest FABER; Felt good on back both legs done separately, mildly restricted bilaterally  POSTURE: rounded shoulders, forward head, increased lumbar lordosis, and anterior pelvic tilt, bilateral genu valgus  PALPATION: Right iliac high  LUMBAR ROM:   AROM eval  Flexion  Limited by body habitus Can reach hands several inches Above knees worse  Extension WFL better   Right lateral flexion Increased NW, reach to 1-2 inches above knee  Left lateral flexion Better, reach 1-2 inches above knee  Right rotation   Left rotation    (Blank rows = not tested)  LOWER EXTREMITY  MMT    STRENGTH  Right eval Left eval  Hip flexion 3+, trembles 4-  Hip extension    Hip abduction Seated 4/5 4+  Hip adduction    Hip internal rotation 4 4  Hip external rotation 4+ 4+  Knee flexion 5 5  Knee extension 4- 4  Ankle dorsiflexion 4+ 4+  Ankle plantarflexion    Ankle inversion    Ankle eversion     (Blank rows = not tested)     (Blank rows = not tested)  LUMBAR SPECIAL TESTS:  Straight leg raise test: Negative and FABER test: Negative  FUNCTIONAL TESTS:  30 second sit to stand;    TREATMENT DATE:  02/28/24: Nu Step LE 8, UE 8 Lev 6 x 5 min  steps UE's on large green ball on mat table with rolling ball forward, then side to side for low back stretch x 10 each - increased pain today HS stretch on power plate x 30 sec each Supine figure 4 stretch 2 x 30 each  Passive HS 1 x 30 each with R-sided lumbar spasms following Tandem walking forward & backwards x 4 laps each in // bars with no UE support when walking forward High knee marching x 2 laps in // bars Sidestepping with mini squat x 2 laps in // bars Marching Rios on purple airex x 5 each with focus on core engagement in // bars Rios hip abduction, flexion & extension 2 x 10 in // bars with VC to keep foot from turning outward with hip abduction Power Plate for heel raises x 30 sec, then mini squats focusing on core engagement at end motions x 30 sec each Supine ppt x 12  LTR 2 x10   02/15/24: Nu Step LE 8, UE 8 Lev 6 x 8 min 705 steps At end of session with UE's on large green ball on mat table with rolling ball forward for low back stretch x 10, then same with adding roll ball to Lt for Rt low back stretch where spasms mostly felt x 5, pt reports good stretches with these HS stretch on  power plate x 30 sec each Neuro Re Ed In // bars: Front and retro heel-toe walking x 4 laps each, slow, controlled high knee marching x 2 laps; bil sidestepping  with mini squat x 2 laps HS Stretch Rios in // bars with foot on 6 step x 2 reps, 30 sec each Marching Rios on purple airex x 5 each with focus on core engagement Therapeutic Activities Power Plate for heel raises x 30 sec, then mini squats focusing on core engagement at end motions x 30 sec each Supine ppt x 12, then ppt with: low march 2 x 5, and clam (without resistance today as pt just went to the gym) x 10; LTR stretches between sets  02/13/2024 Nu Step LE 7, UE 6 Lev 6 x  8 min 654 steps Incline stretch 3 x 25 sec at // bars Mini squats x 10 holding // bars Marching x 10 ea with HH, emphasis on core Heel raises x 10 in //, emphasis on core Tandem stance B x 30 sec no LOB Step onto ax x 5 B forward and 5 B sideways emphasis on core DLS on ax x 30 sec increased pain, discontinued HS stretches seated with leg on 6 in step 3 x 30 B Figure 4 stretch x 2 B 30 sec Ppt with low march x 10, emphasis on core Supine clam yellow x 10 with ppt and emphasis on core LTR x 5-6 B Printed aquatic instructions and suggested pt go look at the pool and see if she can tolerate the distance walked to get to pool,knowing she can stop and rest at chairs on way in. Also discussed she may want to talk with therapist and explain she needs to check her colostomy bag periodically.  02/09/24 NuStep Resistance 6, LE 7/ UE 6 x 10:43 mins with 849 steps Reviewed HEP with performance of each:   LTR 3 x 6 bil   Manual Hamstring stretches: bil 2x30   PPT/TrA with reminder of cueing x 10   With ball squeeze 4 x 10    With mini march x 6bil  (Increased pain)    Sidelying abduction x 10 bil  Bil heel raises x 10 with VC's Mini squat x 10   Bil tandem stance x 30 sec each  Bil SLS x 30 sec each, VC's throughout all to relax shoulders and rely on UE  support as little as possible  Large ball roll out at the middle, left, and right 2x20  Mermaid stretch x 2 bil  Calf stretches 2x20 bil      PATIENT EDUCATION:  Education details: Bil LE strength, flexibility, balance, and core strength  Person educated: Patient and Spouse Education method: Explanation, Demonstration, and Handouts Education comprehension: verbalized understanding and returned demonstration, tactile and VC's during and will benefit from review to assess technique  HOME EXERCISE PROGRAM: Rios BB, checking pain before and after and stopping if pain is increased Towel roll for sitting posture 02/01/24 - Bil LE strength, flexibility, and core strength  ASSESSMENT:  CLINICAL IMPRESSION: Patient tolerates the addition of Rios hip flexion, extension, and abduction with no increases in muscle spasms or pain however, verbal cueing is required for proper body mechanics. Patient is able to perform tandem walking forward without UE support but continues to use UE support when going backwards. The patient has increased RLE and lumbar cramping throughout the session. The cramping increases with a supine passive hamstring stretch on the RLE, as well as Rios flexion and abduction ball roll outs. Patient reports at the end of the session that exercise does seem to help stretch out the back but today has not been a good day overall in regards to the pain and cramping. The patient would benefit from continued skill physical therapy to address the impairments listed below.     OBJECTIVE IMPAIRMENTS: Abnormal gait, decreased activity tolerance, decreased balance, decreased knowledge of condition, decreased mobility, difficulty walking, decreased ROM, decreased strength, increased muscle spasms, improper body mechanics, postural dysfunction, obesity, and pain.   ACTIVITY LIMITATIONS: lifting, bending, sitting, Rios, squatting, sleeping, stairs, bed mobility, bathing, dressing,  locomotion level, and caring for others  PARTICIPATION LIMITATIONS: meal prep, cleaning, laundry, shopping, and community activity  PERSONAL FACTORS: Past/current experiences and 3+ comorbidities: colon cancer, chemotherapy,  obesity are also affecting patient's functional outcome.   REHAB POTENTIAL: Good  CLINICAL DECISION MAKING: Evolving/moderate complexity  EVALUATION COMPLEXITY: Moderate   GOALS: Goals reviewed with patient? Yes  SHORT TERM GOALS: Target date: 02/20/2024  Pt will be independent in a HEP for ROM, UE and LE and core strength Baseline: Goal status: INITIAL  2.  Pt will have decreased Back pain by 25% for improved tolerance for home activities Baseline:  Goal status: INITIAL  3.  Pt will be able to walk 10-15 min with several rest periods with min LBP Baseline:  Goal status: INITIAL  4.  Pt will be tolerant of aquatic therapy as indicated (has waterproof pouch that works) Baseline:  Goal status: INITIAL   LONG TERM GOALS: Target date: 03/19/2024 Pt will have decreased back pain by 50% or greater for improved ability to tolerate home activities Baseline:  Goal status: INITIAL  2.  ODI will be no greater than 30%  to demonstrate improved function Baseline: 60% Goal status: INITIAL  3.  Pt will be able to walk 8/10 of a mile with rest periods as done previously with minimal LBP Baseline:  Goal status: INITIAL  4.  Pt will be able to stand long enough to fix small meal without LBP Baseline:  Goal status: INITIAL  5.  Pt will be educated in proper body mechanics for home activities Baseline:  Goal status: INITIAL  6.  Pt will be able to go up stairs with alternate gait pattern Baseline:  Goal status: INITIAL  PLAN:  PT FREQUENCY: 2x/week  PT DURATION: 8 weeks  PLANNED INTERVENTIONS: 97164- PT Re-evaluation, 97110-Therapeutic exercises, 97530- Therapeutic activity, [id number]- Neuromuscular re-education, 97535- Self Care, [id number]- Manual therapy,  [id number]- Gait training, [id number]- Aquatic Therapy, Cryotherapy, and Moist heat.  PLAN FOR NEXT SESSION: Do TUG, review HEP, check back pain and understanding of exs, UE, LE, core strength, aquatics 1x per week in place of 1 clinic therapy if colostomy pouch is waterproof (ask about this during next session), body mechanics education, CP, MH prn, step taps and step ups in parallel bars   Delon Pack, Student-PT 02/28/2024, 5:18 PM I have reviewed and concur with this student's documentation.   Grayce JINNY Mikayla, PT 02/28/2024 5:38 PM          Can[facility] [phone] HIP: Flexion Rios    Holding onto counter lift leg straight in front keeping knee straight. Slow and controlled! _10__ reps per set, _2-3__ sets per day. Then repeat with other leg.   Hip Extension (Rios)    Stand with support at counter. Squeeze pelvic floor and hold so as not to twist hips and don't lean forward.  Move right leg backward with straight knee. Slow and controlled! Repeat _10__ times. Do _2-3__ times a day. Repeat with other leg.  Hip Abduction (Rios)    Stand with support. Squeeze pelvic floor and hold. Lift right leg out to side, keeping toe forward.  Repeat _10__ times. Do _2-3__ times a day. Repeat with other leg.   Heel Raise: Bilateral (Rios)      Stand near counter for fingertip support if needed. Rise on balls of feet. Repeat __10-20__ times per set. Do _1-2___ sets per session. Do __2__ sessions per day.  SINGLE LIMB STANCE    Stand at counter (corner if you have one) for minimal arm support. Raise leg. Hold _10-20__ seconds. Repeat with other leg. Once this becomes easier, for increased challenge, close eyes with fingertips on counter for safety.  _3-5__ reps per set, _2-3__ sets per day.  Tandem Stance    Stand at counter (corner if you have one). Right foot in front of left, heel touching toe both feet straight ahead. Stand on Foot Triangle of Support with both  feet. Balance in this position _10-20__ seconds. Do with left foot in front of right. Once this becomes easier, for increased challenge, close eyes with fingertips on counter for safety.  Mini Squat: Double Leg    Rios at counter. With feet shoulder width apart, reach forward for balance and do a mini squat. Keep knees in line with second toe. Knees do not go past toes. Repeat _5__ times per set. Do __2_ sets per session.  PELVIC TILT: Posterior    Tighten abdominals, flatten low back. _10__ reps per set, __1-2_ sets per day.  Tilt with : Clams  Alternate mini march Pillow squeeze   2-3 sets of 5 reps  Stretches  Low trunk rotation laying on bed Hamstring stretch seated at edge of chair with knee straight and lean forward leading with chest Rios at dining room table, place foot in chair behind you and stand tall for stretch in thigh.  Hold 20-30 sec, 2-3 reps throughout day

## 2024-02-29 ENCOUNTER — Inpatient Hospital Stay (HOSPITAL_BASED_OUTPATIENT_CLINIC_OR_DEPARTMENT_OTHER): Admitting: Hematology

## 2024-02-29 DIAGNOSIS — J90 Pleural effusion, not elsewhere classified: Secondary | ICD-10-CM | POA: Diagnosis not present

## 2024-02-29 DIAGNOSIS — Z5112 Encounter for antineoplastic immunotherapy: Secondary | ICD-10-CM | POA: Diagnosis not present

## 2024-02-29 DIAGNOSIS — I2510 Atherosclerotic heart disease of native coronary artery without angina pectoris: Secondary | ICD-10-CM | POA: Diagnosis not present

## 2024-02-29 DIAGNOSIS — Z79899 Other long term (current) drug therapy: Secondary | ICD-10-CM | POA: Diagnosis not present

## 2024-02-29 DIAGNOSIS — D3502 Benign neoplasm of left adrenal gland: Secondary | ICD-10-CM | POA: Diagnosis not present

## 2024-02-29 DIAGNOSIS — K8020 Calculus of gallbladder without cholecystitis without obstruction: Secondary | ICD-10-CM | POA: Diagnosis not present

## 2024-02-29 DIAGNOSIS — Z1501 Genetic susceptibility to malignant neoplasm of breast: Secondary | ICD-10-CM | POA: Diagnosis not present

## 2024-02-29 DIAGNOSIS — C187 Malignant neoplasm of sigmoid colon: Secondary | ICD-10-CM | POA: Diagnosis not present

## 2024-02-29 DIAGNOSIS — J9811 Atelectasis: Secondary | ICD-10-CM | POA: Diagnosis not present

## 2024-02-29 DIAGNOSIS — M2550 Pain in unspecified joint: Secondary | ICD-10-CM | POA: Diagnosis not present

## 2024-02-29 DIAGNOSIS — C7951 Secondary malignant neoplasm of bone: Secondary | ICD-10-CM | POA: Diagnosis not present

## 2024-02-29 DIAGNOSIS — D126 Benign neoplasm of colon, unspecified: Secondary | ICD-10-CM | POA: Diagnosis not present

## 2024-02-29 DIAGNOSIS — R911 Solitary pulmonary nodule: Secondary | ICD-10-CM | POA: Diagnosis not present

## 2024-02-29 DIAGNOSIS — C786 Secondary malignant neoplasm of retroperitoneum and peritoneum: Secondary | ICD-10-CM

## 2024-02-29 DIAGNOSIS — D259 Leiomyoma of uterus, unspecified: Secondary | ICD-10-CM | POA: Diagnosis not present

## 2024-02-29 DIAGNOSIS — N920 Excessive and frequent menstruation with regular cycle: Secondary | ICD-10-CM | POA: Diagnosis not present

## 2024-02-29 DIAGNOSIS — K6819 Other retroperitoneal abscess: Secondary | ICD-10-CM | POA: Diagnosis not present

## 2024-02-29 DIAGNOSIS — R222 Localized swelling, mass and lump, trunk: Secondary | ICD-10-CM | POA: Diagnosis not present

## 2024-02-29 DIAGNOSIS — Z933 Colostomy status: Secondary | ICD-10-CM | POA: Diagnosis not present

## 2024-02-29 DIAGNOSIS — K760 Fatty (change of) liver, not elsewhere classified: Secondary | ICD-10-CM | POA: Diagnosis not present

## 2024-02-29 DIAGNOSIS — K5720 Diverticulitis of large intestine with perforation and abscess without bleeding: Secondary | ICD-10-CM | POA: Diagnosis not present

## 2024-02-29 DIAGNOSIS — E278 Other specified disorders of adrenal gland: Secondary | ICD-10-CM | POA: Diagnosis not present

## 2024-02-29 DIAGNOSIS — Z1509 Genetic susceptibility to other malignant neoplasm: Secondary | ICD-10-CM | POA: Diagnosis not present

## 2024-02-29 DIAGNOSIS — R180 Malignant ascites: Secondary | ICD-10-CM | POA: Diagnosis not present

## 2024-02-29 DIAGNOSIS — N83201 Unspecified ovarian cyst, right side: Secondary | ICD-10-CM | POA: Diagnosis not present

## 2024-02-29 NOTE — Progress Notes (Signed)
 Cardiology Office Note    Date:  03/03/2024  ID:  Mikayla, Rios April 29, 1986, MRN [id number] PCP:  Mikayla Medici, MD  Cardiologist:  Mikayla LITTIE Nanas, MD  Electrophysiologist:  None   Chief Complaint: Follow up for hypertension   History of Present Illness: .    Mikayla Rios is a 38 y.o. female with visit-pertinent history of coronary artery calcifications noted on prior CTs, palpitations/tachycardia with no significant arrhythmias noted on monitor in 11/2019, hypertension, hyperlipidemia, obstructive sleep apnea, perforated diverticulitis with retroperitoneal abscess s/p colectomy and end colostomy in 04/2021, colon cancer, anemia, chronic pain and morbid obesity who is followed by Mikayla Rios.  Patient was referred to Dr. Nanas in 10/2019 for further evaluation of tachycardia with episodes of what patient described as heart racing that occurred a few times per day.  She also reported dyspnea with exertion.  Echo and ZIO monitor were ordered for further evaluation.  Echo showed LVEF of 35% with mild LVH but no regional wall motion abnormalities and normal diastolic parameters.  ZIO monitor showed rare PACs/PVCs but no significant arrhythmia.  Patient had admissions in 2022 and 2023 for noncardiac issues.  She was admitted for septic shock secondary to chronic perforated colon with retroperitoneal abscess and partial small bowel obstruction.  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  Echo during admission showed LVEF 60 to 65% with no regional wall motion abnormalities and no significant valvular disease.  At office visit in 12/2021 she reported occasional episodes of dizziness and near syncope that seem to related to drops in BP.  She is advised to keep a blood pressure log for 2 weeks.  Patient was last seen in clinic by Mikayla Door, PA on/7/25.  She denied any recurrent tachycardia/palpitations.  She did report  some lower extremity edema mostly in her feet and ankles for the prior 4 months.  She had as needed Lasix  for reported she had not been taking this as she was told she could not take it with valsartan  which which she was previously prescribed.  Patient noted that her biggest concern was her blood pressure which had been as high as the 160s over 90s at home.  It was noted that patient was on Keytruda  receiving every 3 weeks.  Patient's valsartan  was increased 160 mg daily.  Following patient notified the office that her blood pressure remained above goal, it was recommended that she stop Lopressor  and start carvedilol  18.75 mg twice daily.  Today she presents for follow-up with her husband.  She reports that she has been doing well overall.  She denies any significant chest pain, palpitations, orthopnea or PND.  Patient reports intermittent lower extremity edema, denies any significant changes, reports that she will take her Lasix  as needed with improvement.  Patient notes problems with body spasms which they feel is related to her history of chemotherapy.  She does note some dyspnea on exertion such as when climbing stairs, questions if this is in part related to weight gain and some deconditioning.  She notes that she has started doing PT sessions twice a week and also is exercising at the gym twice a week.  She continues on Keytruda  every three weeks. She has noted some decreased/blurry vision in recent weeks, is planning to follow-up with an eye doctor regarding this.  Patient notes that she was to have an at home sleep study earlier in the year however was told by  her insurance company that this was denied, she reports that she still has the kit at home.  Labwork independently reviewed: 02/16/2024: Sodium 138, potassium 3.6, creatinine 0.80, AST 13, ALT 12, hemoglobin 11.4, hematocrit 35.6 ROS: .   Today she denies chest pain, fatigue, palpitations, melena, hematuria, hemoptysis, diaphoresis, presyncope,  syncope, orthopnea, and PND.  All other systems are reviewed and otherwise negative. Studies Reviewed: Mikayla Rios   EKG:  EKG is not ordered today.  CV Studies: Cardiac studies reviewed are outlined and summarized above. Otherwise please see EMR for full report. Cardiac Studies & Procedures   ______________________________________________________________________________________________     ECHOCARDIOGRAM  ECHOCARDIOGRAM COMPLETE 04/14/2021  Narrative ECHOCARDIOGRAM REPORT    Patient Name:   Mikayla Rios Date of Exam: 04/14/2021 Medical Rec #:  [id number]      Height:       63.0 in Accession #:    [id number]     Weight:       200.0 lb Date of Birth:  April 05, 1986       BSA:          1.934 m Patient Age:    35 years       BP:           109/82 mmHg Patient Gender: F              HR:           133 bpm. Exam Location:  Inpatient  Procedure: 2D Echo, Cardiac Doppler and Color Doppler  Indications:    I47.2 Ventricular tachycardia  History:        Patient has prior history of Echocardiogram examinations, most recent 11/09/2019. Arrythmias:Prolonged QT interval; Risk Factors:Hypertension.  Sonographer:    Mikayla Rios Referring Phys: Mikayla Rios  IMPRESSIONS   1. Left ventricular ejection fraction, by estimation, is 60 to 65%. The left ventricle has normal function. The left ventricle has no regional wall motion abnormalities. Indeterminate diastolic filling due to E-A fusion. 2. Right ventricular systolic function is normal. The right ventricular size is normal. 3. The mitral valve is normal in structure. No evidence of mitral valve regurgitation. No evidence of mitral stenosis. 4. The aortic valve is normal in structure. Aortic valve regurgitation is not visualized. No aortic stenosis is present. 5. The inferior vena cava is normal in size with greater than 50% respiratory variability, suggesting right atrial pressure of 3 mmHg.  FINDINGS Left Ventricle: Left ventricular ejection  fraction, by estimation, is 60 to 65%. The left ventricle has normal function. The left ventricle has no regional wall motion abnormalities. The left ventricular internal cavity size was normal in size. There is no left ventricular hypertrophy. Indeterminate diastolic filling due to E-A fusion.  Right Ventricle: The right ventricular size is normal. No increase in right ventricular wall thickness. Right ventricular systolic function is normal.  Left Atrium: Left atrial size was normal in size.  Right Atrium: Right atrial size was normal in size.  Pericardium: There is no evidence of pericardial effusion.  Mitral Valve: The mitral valve is normal in structure. No evidence of mitral valve regurgitation. No evidence of mitral valve stenosis.  Tricuspid Valve: The tricuspid valve is normal in structure. Tricuspid valve regurgitation is not demonstrated. No evidence of tricuspid stenosis.  Aortic Valve: The aortic valve is normal in structure. Aortic valve regurgitation is not visualized. No aortic stenosis is present. Aortic valve mean gradient measures 4.0 mmHg. Aortic valve peak gradient measures 8.6 mmHg.  Pulmonic Valve: The pulmonic  valve was normal in structure. Pulmonic valve regurgitation is not visualized. No evidence of pulmonic stenosis.  Aorta: The aortic root is normal in size and structure.  Venous: The inferior vena cava is normal in size with greater than 50% respiratory variability, suggesting right atrial pressure of 3 mmHg.  IAS/Shunts: No atrial level shunt detected by color flow Doppler.   LEFT VENTRICLE PLAX 2D LVIDd:         3.80 cm LVIDs:         2.30 cm LV PW:         1.00 cm LV IVS:        1.00 cm   RIGHT VENTRICLE RV S prime:     21.90 cm/s  LEFT ATRIUM             Index LA diam:        3.50 cm 1.81 cm/m LA Vol (A2C):   32.1 ml 16.60 ml/m LA Vol (A4C):   29.5 ml 15.26 ml/m LA Biplane Vol: 32.3 ml 16.70 ml/m AORTIC VALVE                   PULMONIC  VALVE AV Vmax:           147.00 cm/s PV Vmax:       1.21 m/s AV Vmean:          96.600 cm/s PV Peak grad:  5.9 mmHg AV VTI:            0.168 m AV Peak Grad:      8.6 mmHg AV Mean Grad:      4.0 mmHg LVOT Vmax:         85.50 cm/s LVOT Vmean:        53.100 cm/s LVOT VTI:          0.097 m LVOT/AV VTI ratio: 0.58  AORTA Ao Root diam: 3.00 cm Ao Asc diam:  2.90 cm   SHUNTS Systemic VTI: 0.10 m  Jerel Croitoru MD Electronically signed by Jerel Balding MD Signature Date/Time: 04/14/2021/1:18:01 PM    Final    MONITORS  LONG TERM MONITOR (3-14 DAYS) 11/12/2019  Narrative  No significant arrhythmias detected  3 days of data recorded on Zio monitor. Patient had a min HR of 55 bpm, max HR of 132 bpm, and avg HR of 81 bpm. Predominant underlying rhythm was Sinus Rhythm. No VT, SVT, atrial fibrillation, high degree block, or pauses noted. Isolated atrial and ventricular ectopy was rare (<1%). There were 0 triggered events. No significant arrhythmias detected.       ______________________________________________________________________________________________       Current Reported Medications:.    Current Meds  Medication Sig   acetaminophen -codeine  (TYLENOL  #3) 300-30 MG tablet Take 1 tablet by mouth every 8 (eight) hours as needed for moderate pain (pain score 4-6).   ALPRAZolam  (XANAX ) 0.25 MG tablet Take 1 tablet (0.25 mg total) by mouth at bedtime as needed for anxiety.   amLODipine  (NORVASC ) 10 MG tablet TAKE 1 TABLET BY MOUTH EVERY DAY   aspirin  EC 81 MG tablet Take 1 tablet (81 mg total) by mouth daily. Swallow whole.   atorvastatin  (LIPITOR) 40 MG tablet Take 1 tablet (40 mg total) by mouth daily.   carvedilol  (COREG ) 12.5 MG tablet TAKE 1.5 TABLETS BY MOUTH 2 (TWO) TIMES DAILY.   diclofenac  Sodium (VOLTAREN ) 1 % GEL Apply 2 g topically 4 (four) times daily.   dicyclomine  (BENTYL ) 10 MG capsule Take 1 capsule (10 mg  total) by mouth every 8 (eight) hours as needed for  spasms.   furosemide  (LASIX ) 20 MG tablet TAKE 1 TABLET BY MOUTH EVERY DAY AS NEEDED   gabapentin  (NEURONTIN ) 300 MG capsule Take 1 capsule (300 mg total) by mouth at bedtime.   KLOR-CON  M20 20 MEQ tablet TAKE 1 TABLET BY MOUTH EVERY DAY   lidocaine -prilocaine  (EMLA ) cream Apply 1 Application topically as needed.   methocarbamol  (ROBAXIN -750) 750 MG tablet Take 1 tablet (750 mg total) by mouth every 8 (eight) hours as needed for muscle spasms.   prochlorperazine  (COMPAZINE ) 10 MG tablet Take 1 tablet (10 mg total) by mouth every 8 (eight) hours as needed for nausea or vomiting.   traZODone  (DESYREL ) 50 MG tablet TAKE 1-2 TABLETS BY MOUTH AT BEDTIME AS NEEDED FOR SLEEP.   triamcinolone  cream (KENALOG ) 0.1 % APPLY TO AFFECTED AREA TWICE DAILY AS NEEDED   valsartan  (DIOVAN ) 320 MG tablet Take 1 tablet (320 mg total) by mouth daily.    Physical Exam:    VS:  BP 114/78   Pulse 97   Ht 5' 4 (1.626 m)   Wt (!) 357 lb 9.6 oz (162.2 kg)   SpO2 98%   BMI 61.38 kg/m    Wt Readings from Last 3 Encounters:  03/02/24 (!) 357 lb 9.6 oz (162.2 kg)  01/26/24 (!) 360 lb 8 oz (163.5 kg)  01/05/24 (!) 358 lb 6.4 oz (162.6 kg)    GEN: Well nourished, obese african [place] female in no acute distress NECK: No JVD; No carotid bruits CARDIAC: RRR, no murmurs, rubs, gallops RESPIRATORY:  Clear to auscultation without rales, wheezing or rhonchi  ABDOMEN: Soft, non-tender, non-distended EXTREMITIES:  Mild bilateral lower extremity edema; No acute deformity     Asessement and Plan:.    Coronary calcifications: Prior CT scan in 10/2021 showed mild coronary calcifications. Stable with no anginal symptoms. No indication for ischemic evaluation.  Heart healthy diet and regular cardiovascular exercise encouraged. Continue aspirin  81 mg daily and Lipitor 40 mg daily.  History of palpitations/sinus tachycardia: ZIO monitor in 10/2019 showed rare PACs/PVCs but no significant arrhythmias. Today she reports that her  palpitations have been very well-controlled.  She denies any presyncope or syncope. Continue carvedilol  12.5 mg twice daily.  Hypertension: Blood pressure today 114/78.  Patient reports that her blood pressures have been very well-controlled in recent months.  She does note some occasional slight dizziness with position changes, encourage patient to monitor her blood pressure at home and notify the office of worsening symptoms or low blood pressure.  Continue amlodipine  10 mg daily, carvedilol  12.5 mg twice daily, Lasix  20 mg as needed for increased lower extremity edema, valsartan  320 mg daily.  Hyperlipidemia: Last lipid profile on 12/15/2023 indicated total cholesterol 117, HDL 46, triglycerides 64 and LDL 58.  Continue Lipitor 40 mg daily.  Lower extremity edema: Patient notes that she has history of intermittent lower extremity edema, stable today.  Continue Lasix  20 mg daily as needed for worsening lower extremity edema.  OSA/sleep disordered breathing: Patient notes that she has history of OSA, has not used CPAP in many years.  Patient with history of hypertension as well as coronary artery calcification.  She has noted some weight gain in recent months.  Her STOP-BANG score is 5.  Earlier in the year Mikayla Rios was ordered, patient still has box at home, has not opened. Will reach out to sleep team regarding testing.    Disposition: F/u with Dr. Kate in 3-4 months  or sooner if needed.   Signed, Kyllian Clingerman D Sharran Caratachea, NP

## 2024-02-29 NOTE — Progress Notes (Signed)
 [facility]   [phone] Fax:([phone]   Clinic Follow up Note   Patient Care Team: Jarold Medici, MD as PCP - General (Internal Medicine) [name], MD as PCP - Cardiology (Cardiology) [name], [name] as Physician Assistant (Emergency Medicine) Lanny Callander, MD as Consulting Physician (Oncology) Sheldon Standing, MD as Consulting Physician (General Surgery) [name], Dedra, MD as Consulting Physician (Neurology) [name], Krystal, MD as Consulting Physician (Obstetrics and Gynecology) [name], MD as Consulting Physician (Gastroenterology) [name], [name], NP as Nurse Practitioner (Nurse Practitioner) 02/29/2024  I connected with Mikayla Rios on 02/29/24 at 12:15 PM EDT by telephone and verified that I am speaking with the correct person using two identifiers.   I discussed the limitations, risks, security and privacy concerns of performing an evaluation and management service by telephone and the availability of in person appointments. I also discussed with the patient that there may be a patient responsible charge related to this service. The patient expressed understanding and agreed to proceed.   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 She has been tolerating well overall but has developed joint pain which could be related, overall manageable. She is clinically doing better also with more energy and less  abdominal pain   -her CEA has normalized on immunotherapy. -CT CAP on 03/15/22 again showed mixed response -she had PET scan on 04/26/2022 for evaluation of her ovarian cysts, and saw GYN Dr. [name], we decided to monitor it. -She tolerating Keytruda  well, with mild joint pain.  She recently started workout at the gym, which has helped her joint pain. -restaging CT from 08/04/2022 showed stable disease in left abdomen and pelvis, no new lesions.  I personally reviewed her scan images, and discussed findings with patient.   -Will continue Keytruda   -repeated CT scan on January 24, 2023 showed continuous response, no new lesions.  -PET on 05/04/2023 showed no definitive evidence of progression, some mildly hypermetabolic nodes, will continue monitoring, plan to stop Keytruda  in 09/2023 if no progression on next scan  -PET 08/29/2023 showed similar tiny left pericolic hypermetabolic nodules, most consistent with metastatic disease, no other definitive evidence of residual disease  -ctDNA GuardantReveal was negative on 10/13/2023   Assessment & Plan Metastatic colon cancer Metastatic colon cancer with stable small nodules in the left abdomen. No significant tumor growth on recent CT scan. - Schedule two more treatment cycles. - Perform tumor-specific test (Signatera or Oncodetect) on October 2nd.  Right ovarian cyst with pelvic pain and heavy menstrual bleeding Right ovarian cyst larger than before, causing pelvic pain and heavy menstrual bleeding, especially during the first two days of menstruation. Cyst size fluctuation suggests it may be physiological rather than malignant. - Refer to OB/GYN for further evaluation. - Consider ultrasound or MRI to assess the ovarian cyst.  Left adrenal gland nodule, likely benign adenoma Nodule in the left adrenal gland, likely a benign adenoma, with no changes on recent imaging.  Plan - I personally reviewed her restaging CT scan and discussed  the findings with  the patient - I will reach out to her oncologist Dr. [name] to see if we need additional testing for her right ovarian cyst - Continue Keytruda , next due on October 2 -Will obtain Signatera on her lab on October 2 - I will see her back on October 23 before her treatment.   SUMMARY OF ONCOLOGIC HISTORY: Oncology History Overview Note   Cancer Staging  Malignant neoplasm of sigmoid colon [facility]) Staging form: Colon and Rectum, AJCC 8th Edition - Pathologic stage from 04/15/2021: Stage IIC (pT4b, pN0, cM0) - Signed by Lanny Callander, MD on 06/12/2021    Malignant neoplasm of sigmoid colon [facility])   Initial Diagnosis   Malignant neoplasm of sigmoid colon (HCC)   11/04/2020 Imaging   EXAM: CT ABDOMEN AND PELVIS WITHOUT CONTRAST  IMPRESSION: 1. Perforating descending colonic diverticulitis with multiple abdominopelvic gas and fluid collections, measuring up to 5.7 cm and further described above. 2. Cholelithiasis without findings of acute cholecystitis. 3. 3.6 cm benign left adrenal adenoma. 4. Leiomyomatous uterus. 5. 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   11/07/2020 Imaging   EXAM: CT ABDOMEN AND PELVIS WITHOUT CONTRAST  IMPRESSION: Continued wall thickening is seen involving descending colon suggesting infectious or inflammatory colitis or perforated diverticulitis. There is an adjacent fluid collection measuring 7.0 x 5.0 cm consistent with abscess which is significantly enlarged compared to prior exam. Adjacent to the abscess, there is a severely thickened small bowel loop most consistent with secondary inflammation.   5.2 x 3.5 cm fluid collection is noted within the left psoas muscle consistent with abscess which is significantly enlarged compared to prior exam. 4.4 x 3.8 cm fluid collection consistent with abscess is noted in the left retroperitoneal region which is also  enlarged compared to prior exam.   Fibroid uterus.   Cholelithiasis.   11/27/2020 Imaging   EXAM: CT ABDOMEN AND PELVIS WITHOUT CONTRAST  IMPRESSION: 1. Significantly interval decreased size of the previously visualized left retroperitoneal and left anterior mid abdominal fluid collections. There is persistent fat stranding and mural thickening of the mid descending colon at this level. 2. Unchanged leiomyomatous uterus. 3. Unchanged cholelithiasis.   12/11/2020 Imaging   EXAM: CT ABDOMEN AND PELVIS WITHOUT CONTRAST  IMPRESSION: 1. Stable inflammatory changes centered around the distal descending colon in the left lower abdomen. Pericolonic inflammatory changes have minimally changed since 11/27/2020. Small pocket of gas medial to the colon is probably associated with a fistula or small residual abscess collection. 2. Stable position of the two percutaneous drains. The more anterior drain may be extending through a portion of the small bowel. 3. Cholelithiasis. 4. Fibroid uterus. Cannot exclude an ovarian/adnexal cystic structure near the uterine fundus. 5. Left adrenal adenoma.   01/07/2021 Imaging   EXAM: CT ABDOMEN AND PELVIS WITH CONTRAST  IMPRESSION: 1. No new abscesses identified. Similar degree of soft tissue thickening seen in the left pericolonic region. The degree of persistent soft tissues thickening in the pericolonic space further raises suspicions for malignancy. Further evaluation with colonoscopy should be performed. 2. Left retroperitoneal abscess drain unchanged in position. Anterior left abdominal drain again seen terminating within small bowel loop. 3. 2.6 cm mildly sclerotic lesion noted in the L2 vertebral body. Further evaluation with contrast enhanced lumbar spine MRI should be performed.   02/04/2021 Imaging   EXAM: CT ABDOMEN AND PELVIS WITH CONTRAST  IMPRESSION: No new abdominopelvic collections or abscess  development in the 1 month interval.    Left anterior drain remains within a loop of small bowel, unchanged.   Left lateral abscess drain remains in the retroperitoneal space adjacent to the iliopsoas muscle with a small amount of residual fluid and air but no measurable collection.   Stable soft tissue prominence and pericolonic strandy edema/inflammation about the left descending colon compatible with residual diverticulitis/colitis. Difficult to exclude underlying transmural lesion.   04/01/2021 Imaging   EXAM: CT ABDOMEN AND PELVIS WITH CONTRAST  IMPRESSION: There appears to be significant enhancement and wall thickening involving the descending colon with some degree of traction and involvement of adjacent small bowel loops. This is consistent with the history of diverticulitis and perforation. Stable position of percutaneous drainage catheter is seen adjacent to left psoas muscle with no significant residual fluid remaining. The other percutaneous drainage catheter that was previously noted to be within small bowel loop on prior exam in the left lower quadrant, has significantly retracted and appears to be outside of the peritoneal space at this time.   Since the prior exam, there does appear to be some degree of rotation involving mesenteric vessels and structures in the right lower quadrant, suggesting partial volvulus or malrotation. There is the interval development of several lymph nodes in this area, most likely inflammatory or reactive in etiology. Mild amount of free fluid is also noted in the pelvis. However, no significant bowel wall thickening or dilatation is seen in this area. These results will be called to the ordering clinician or representative by the Radiologist Assistant, and communication documented in the PACS or zVision Dashboard.   Stable uterine fibroid.   Hepatic steatosis.   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   Cholelithiasis.   04/10/2021 Imaging   EXAM: CT ANGIOGRAPHY CHEST CT  ABDOMEN AND PELVIS WITH CONTRAST  IMPRESSION: 1. Again seen are findings compatible with descending colon diverticulitis with perforation. Free air and inflammation have increased in the interval. 2. New lobulated enhancing fluid collection posterior to this segment of inflamed colon measuring 8.5 x 3.5 x 10.0 cm. This collection now invades the adjacent iliopsoas muscle as well as extends through the left lateral abdominal wall. The tip of the drainage catheter is in this collection. Findings are compatible with abscess. 3. Anterior left percutaneous drainage catheter tip has been pulled back and is now within the subcutaneous tissues. 4. Trace free fluid. 5. No pulmonary embolism.  No acute cardiopulmonary process. 6. Cholelithiasis. 7. Fatty infiltration of the liver.   04/15/2021 Definitive Surgery   FINAL MICROSCOPIC DIAGNOSIS:   A. SMALL BOWEL, RESECTION:  - Adenocarcinoma.  - No carcinoma identified in 1 lymph node.   B. PERFORATED LEFT COLON, RESECTION:  - Moderately differentiated colonic adenocarcinoma.  - Tumor extends into pericolonic adipose tissue, and is strongly  suggestive of invasion into small bowel.  See oncology table/comments.  - No carcinoma identified in 4 lymph nodes (0/4).  - Tubular adenoma with high-grade dysplasia, 1.  - Tubular adenomas with low grade dysplasia, 3.   Comments: The size of the tumor is difficult to estimate secondary to the disrupted nature of the specimen, as well as the infiltrative nature of the tumor.  Tumor can be identified as definitely invading into the pericolonic adipose tissue (block B4), but given the extreme disruption of the tissue, and the involvement of the small bowel by what appears to be a colonic adenocarcinoma, I favor perforation of the large bowel with direct invasion into the small  bowel.  Accordingly, I believe this is best regarded as a pT4b lesion.   ADDENDUM:  Mismatch Repair Protein (IHC)  SUMMARY  INTERPRETATION: ABNORMAL  There is loss of the major MMR protein [name]6: This indicates a high probability that a hereditary germline mutation is present and referral to genetic counseling is warranted. It is recommended that the loss of protein expression be correlated with molecular based microsatellite instability testing.   IHC EXPRESSION RESULTS  TEST           RESULT  MLH1:          Preserved nuclear expression  [name]2:          Preserved nuclear expression  [name]6:          LOSS OF NUCLEAR EXPRESSION  PMS2:          Preserved nuclear expression    04/15/2021 Cancer Staging   Staging form: Colon and Rectum, AJCC 8th Edition - Pathologic stage from 04/15/2021: Stage IIC (pT4b, pN0, cM0) - Signed by Lanny Callander, MD on 06/12/2021 Stage prefix: Initial diagnosis Total positive nodes: 0 Histologic grading system: 4 grade system Histologic grade (G): G2 Residual tumor (R): R0 - None   04/29/2021 Imaging   EXAM: CT ABDOMEN AND PELVIS WITH CONTRAST  IMPRESSION: Continued left retroperitoneal abscesses inferior to the left kidney, involving the left psoas muscle and left abdominal wall musculature. Overall size is decreased since prior study. Interval removal of left lower quadrant abscess drainage catheter.   New fluid collection in the cul-de-sac and wrapping around the uterus concerning for abscess.   Cholelithiasis.   Small left pleural effusion.  Bibasilar atelectasis.   Stable left adrenal adenoma.  ADDENDUM: After discussing the case with Dr. Katha in interventional radiology, it was noted that the fluid in the pelvis originally thought to be in the cul-de-sac is likely within the vagina, best seen on sagittal imaging. Recommend speculum exam.   Also, in the left lateral wall in the area of prior abscess in the lateral wall abdominal musculature, some of the abdomen soft tissue appears to be enhancing. While this could be infectious, cannot exclude tumor seeding in the left lateral  abdominal wall, measuring 6.9 x 3.8 cm on image 64 of series 2.   06/16/2021 Imaging   EXAM: CT ABDOMEN AND PELVIS WITH CONTRAST  IMPRESSION: Increased size of bulky soft tissue masses involving the left psoas muscle and left lateral abdominal wall soft tissues, consistent with progressive metastatic disease.   Significant progression of multiple peritoneal masses throughout the abdomen and pelvis, consistent with peritoneal carcinomatosis.   New mild retroperitoneal lymphadenopathy, consistent with metastatic disease.   Stable uterine fibroid and left adrenal adenoma.   Cholelithiasis, without evidence of cholecystitis.   06/22/2021 - 08/19/2021 Chemotherapy   Patient is on Treatment Plan : COLORECTAL FOLFOX q14d x 6 months     06/22/2021 Tumor Marker   Patient's tumor was tested for the following markers: CEA. Results of the tumor marker test revealed 87.41.   07/10/2021 Pathology Results   FINAL MICROSCOPIC DIAGNOSIS:   A. PERITONEAL MASS, LEFT UPPER ABDOMINAL QUADRANT, BIOPSY:  -  Metastatic adenocarcinoma with necrosis, histologically consistent with colon primary.     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 The report date is 07/16/2021. The remainder of testing was negative/normal.  The CancerNext-Expanded + RNAinsight gene panel offered by [employer] and includes sequencing and rearrangement analysis for the following 77 genes:  IP, ALK, APC*, ATM*, AXIN2, BAP1, BARD1, BLM, BMPR1A, BRCA1*, BRCA2*, BRIP1*, CDC73, CDH1*,CDK4, CDKN1B, CDKN2A, CHEK2*, CTNNA1, DICER1, FANCC, FH, FLCN, GALNT12, KIF1B, LZTR1, MAX, MEN1, MET, MLH1*, [name]2*, [name]3, [name]6*, MUTYH*, NBN, NF1*, NF2, NTHL1, PALB2*, PHOX2B, PMS2*, POT1, PRKAR1A, PTCH1, PTEN*, RAD51C*, RAD51D*,RB1, RECQL, RET, SDHA, SDHAF2, SDHB, SDHC, SDHD, SMAD4, SMARCA4, SMARCB1, SMARCE1, STK11, SUFU, TMEM127, TP53*,TSC1, TSC2, VHL and XRCC2 (sequencing and  deletion/duplication); EGFR, EGLN1, HOXB13, KIT, MITF, PDGFRA, POLD1 and POLE (sequencing only); EPCAM and GREM1 (deletion/duplication only).   08/28/2021 Imaging   EXAM: CT CHEST, ABDOMEN, AND PELVIS WITH CONTRAST  IMPRESSION: 1. Continued interval progression of multiple metastatic soft tissue nodules/masses in the abdomen and pelvis. 2. No evidence for metastatic disease in the chest. 3. Similar appearance of the subtle lesion in the L2 vertebral body, suspicious for metastatic involvement. 4. Cholelithiasis.   09/03/2021 -  Chemotherapy   Patient is on Treatment Plan : COLORECTAL Pembrolizumab  (200) q21d     09/08/2021 - 02/01/2022 Chemotherapy   Patient is on Treatment Plan : COLORECTAL Pembrolizumab  (200) q21d     01/08/2022 Imaging   CT CAP IMPRESSION: 1. Mixed response to therapy. Interval decrease in size of the conglomerate multilobulated mass extending from the left psoas into the left posterior pararenal space through the left lateral abdominal wall along the tract of the previously placed percutaneous drainage catheter. Interval decrease in size of right adnexal mass and omental masses within the right lower quadrant of the abdomen. Interval development of peritoneal carcinomatosis with omental caking within the left lower quadrant of the abdomen and left subdiaphragmatic region adjacent to the spleen and along the left pericolic gutter as well as development of ascites appearing loculated within the anterior peritoneum. 2. Interval development of mild left hydronephrosis secondary to stricturing of the mid left ureter. 3. Stable left adrenal metastasis. 4. Stable sclerotic lesion within the L2 vertebral body. No new lytic or blastic bone lesions are seen. 5. Cholelithiasis. 6. No evidence of intrathoracic metastatic disease. 7. 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 Moderate colonic stool burden. No  evidence of obstruction.   03/15/2022 Imaging   IMPRESSION: 1. Status post sigmoid colon resection with left lower quadrant end colostomy. 2. Multicystic bilateral ovarian lesions are slightly increased in size. 3. Diminished, small volume of loculated appearing ascites throughout the abdomen and pelvis, with similar appearance of peritoneal thickening and nodularity throughout. 4. Heterogeneously calcified, conglomerate masses in the left retroperitoneum and abdominal wall are slightly diminished in size. 5. Left adrenal lesion not significantly changed. 6. Unchanged faintly sclerotic metastatic lesion of L2. 7. Constellation of findings is consistent with mixed response to treatment however with overall little interval change. 8. Minimal residual left hydronephrosis and proximal hydroureter, improved compared to prior examination. 9. Cholelithiasis. 10. Coronary artery disease, significantly advanced for patient age.   08/04/2022 Imaging    IMPRESSION: CT CHEST:   1. No developing mass lesion, fluid collection or lymph node enlargement in the thorax. Stable tiny 3 mm lung nodule. 2. Coronary artery calcifications. Please correlate for other coronary risk factors. 3. Chest port   CT ABDOMEN AND PELVIS:   1. Extensive surgical changes identified. Left lower quadrant ostomy, diverting colostomy. Surgical changes along loops of small bowel in the right hemiabdomen. 2. Once again there are areas of nodular tissue extending along the left lateral abdominal wall, into the retroperitoneum which are similar to previous. The amount of adjacent fluid in these locations has  improved. 3. No new mass lesion, fluid collection or lymph node enlargement in the abdomen or pelvis. 4. Gallstones. 5. Enlarged uterus with fibroids. Bilateral complex cystic areas are once again seen and based on appearance differential includes dilated fallopian tubes, hydrosalpinx versus true cystic  lesions. Please correlate for any known history or prior workup 6. Limited evaluation for solid organ pathology metastatic disease without the advantage of IV contrast     05/04/2023 PET scan   PET - restaging - skull base to thigh  IMPRESSION: 1. Subcentimeter peritoneal nodules in the left abdomen have decreased in size and hypermetabolism from 04/26/2022, compatible with treated metastatic disease. 2. Chronic focal hypermetabolism associated with a gallbladder nodule. Malignancy cannot be excluded. 3. Small hypermetabolic to borderline hypermetabolic external iliac and left inguinal lymph nodes and minimally hypermetabolic small bilateral cervical lymph nodes, nonspecific. Metastatic disease not excluded. Recommend attention on follow-up. 4. Left adrenal adenoma. 5. Cystic right adnexal mass is inadequately characterized. Because this lesion is not adequately characterized, prompt US  is recommended for further evaluation. Note: This recommendation does not apply to premenarchal patients and to those with increased risk (genetic, family history, elevated tumor markers or other high-risk factors) of ovarian cancer. Reference: JACR 2020 Feb; 17(2):248-254 6. 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 No developing mass lesion, fluid collection or lymph node enlargement in the thorax. Stable tiny 3 mm lung nodule. 2. Coronary artery calcifications. Please correlate for other coronary risk factors. 3. Chest port   CT ABDOMEN AND PELVIS:   1. Extensive surgical changes identified. Left lower quadrant ostomy, diverting colostomy. Surgical changes along loops of small bowel in the right  hemiabdomen. 2. Once again there are areas of nodular tissue extending along the left lateral abdominal wall, into the retroperitoneum which are similar to previous. The amount of adjacent fluid in these locations has improved. 3. No new mass lesion, fluid collection or lymph node enlargement in the abdomen or pelvis. 4. Gallstones. 5. Enlarged uterus with fibroids. Bilateral complex cystic areas are once again seen and based on appearance differential includes dilated fallopian tubes, hydrosalpinx versus true cystic lesions. Please correlate for any known history or prior workup 6. Limited evaluation for solid organ pathology metastatic disease without the advantage of IV contrast       Discussed the use of AI scribe software for clinical note transcription with the patient, who gave verbal consent to proceed.  History of Present Illness Mikayla Rios is a 38 year old female with metastatic colon cancer who presents for a review of CT scan findings.  The CT scan from February 17, 2024, shows an increase in size of a cyst in the right ovary. She experiences pelvic discomfort, described as cramping pain, particularly around her menstrual period, with heavy bleeding and significant pain for the first two days. Mild cramping persists post-menstruation.  The scan also reveals a stable nodular lesion in the left upper quadrant of the abdomen and a nodule in the left adrenal gland.  Previously, an OB/GYN evaluation with pelvic MRI noted the ovarian lesion at 6.9 cm, with size fluctuations.     REVIEW OF SYSTEMS:   Constitutional: Denies fevers,  chills or abnormal weight loss Eyes: Denies blurriness of vision Ears, nose, mouth, throat, and face: Denies mucositis or sore throat Respiratory: Denies cough, dyspnea or wheezes Cardiovascular: Denies palpitation, chest discomfort or lower extremity swelling Gastrointestinal:  Denies nausea, heartburn or change in bowel habits Skin: Denies  abnormal skin rashes Lymphatics: Denies new lymphadenopathy or easy bruising Neurological:Denies numbness, tingling or new weaknesses Behavioral/Psych: Mood is stable, no new changes  All other systems were reviewed with the patient and are negative.  MEDICAL HISTORY:  Past Medical History:  Diagnosis Date   Allergy    seasonal   Anemia    Blood infection 1985   Blood transfusion without reported diagnosis    colon ca 04/2021   Diverticulitis    Family history of breast cancer    Family history of colon cancer    Family history of colon cancer    Family history of stomach cancer    Hypertension    Obesity    Sleep apnea     SURGICAL HISTORY: Past Surgical History:  Procedure Laterality Date   COLECTOMY WITH COLOSTOMY CREATION/HARTMANN PROCEDURE N/A 04/15/2021   Procedure: COLOSTOMY CREATION/HARTMANN PROCEDURE;  Surgeon: Sheldon Standing, MD;  Location: [facility];  Service: General;  Laterality: N/A;   IR CATHETER TUBE CHANGE  12/12/2020   IR CATHETER TUBE CHANGE  01/27/2021   IR CATHETER TUBE CHANGE  02/19/2021   IR CATHETER TUBE CHANGE  04/13/2021   IR IMAGING GUIDED PORT INSERTION  10/02/2021   IR RADIOLOGIST EVAL & MGMT  11/27/2020   IR RADIOLOGIST EVAL & MGMT  12/11/2020   IR RADIOLOGIST EVAL & MGMT  01/07/2021   IR RADIOLOGIST EVAL & MGMT  02/04/2021   IR SINUS/FIST TUBE CHK-NON GI  12/12/2020   IR SINUS/FIST TUBE CHK-NON GI  02/19/2021   LAPAROSCOPIC PARTIAL COLECTOMY N/A 04/15/2021   Procedure: LAPAROSCOPIC ASSISTED HARTMANN RESECTION;  Surgeon: Sheldon Standing, MD;  Location: [facility];  Service: General;  Laterality: N/A;    I have reviewed the social history and family history with the patient and they are unchanged from previous note.  ALLERGIES:  is allergic to chlorhexidine  gluconate, shellfish allergy, and penicillins.  MEDICATIONS:  Current Outpatient Medications  Medication Sig Dispense Refill   acetaminophen -codeine  (TYLENOL  #3) 300-30 MG tablet Take 1 tablet by  mouth every 8 (eight) hours as needed for moderate pain (pain score 4-6). 30 tablet 0   ALPRAZolam  (XANAX ) 0.25 MG tablet Take 1 tablet (0.25 mg total) by mouth at bedtime as needed for anxiety. 30 tablet 0   amLODipine  (NORVASC ) 10 MG tablet TAKE 1 TABLET BY MOUTH EVERY DAY 90 tablet 3   aspirin  EC 81 MG tablet Take 1 tablet (81 mg total) by mouth daily. Swallow whole.     atorvastatin  (LIPITOR) 40 MG tablet Take 1 tablet (40 mg total) by mouth daily. 30 tablet 6   carvedilol  (COREG ) 12.5 MG tablet TAKE 1.5 TABLETS BY MOUTH 2 (TWO) TIMES DAILY. 270 tablet 2   diclofenac  Sodium (VOLTAREN ) 1 % GEL Apply 2 g topically 4 (four) times daily. 100 g 2   dicyclomine  (BENTYL ) 10 MG capsule Take 1 capsule (10 mg total) by mouth every 8 (eight) hours as needed for spasms. 20 capsule 0   furosemide  (LASIX ) 20 MG tablet TAKE 1 TABLET BY MOUTH EVERY DAY AS NEEDED 90 tablet 1   gabapentin  (NEURONTIN ) 300 MG capsule Take 1 capsule (300 mg total) by mouth at bedtime. 90 capsule 1   KLOR-CON  M20 20  MEQ tablet TAKE 1 TABLET BY MOUTH EVERY DAY 90 tablet 1   lidocaine -prilocaine  (EMLA ) cream Apply 1 Application topically as needed. 30 g 3   methocarbamol  (ROBAXIN -750) 750 MG tablet Take 1 tablet (750 mg total) by mouth every 8 (eight) hours as needed for muscle spasms. 60 tablet 0   prochlorperazine  (COMPAZINE ) 10 MG tablet Take 1 tablet (10 mg total) by mouth every 8 (eight) hours as needed for nausea or vomiting. 30 tablet 0   traZODone  (DESYREL ) 50 MG tablet TAKE 1-2 TABLETS BY MOUTH AT BEDTIME AS NEEDED FOR SLEEP. 180 tablet 1   triamcinolone  cream (KENALOG ) 0.1 % APPLY TO AFFECTED AREA TWICE DAILY AS NEEDED 30 g 0   valsartan  (DIOVAN ) 320 MG tablet Take 1 tablet (320 mg total) by mouth daily. 30 tablet 6   No current facility-administered medications for this visit.   Facility-Administered Medications Ordered in Other Visits  Medication Dose Route Frequency Provider Last Rate Last Admin   fentaNYL  (SUBLIMAZE )  injection   Intravenous PRN Mir, Farhaan R, MD   50 mcg at 10/02/21 1436   midazolam  (VERSED ) injection   Intravenous PRN Mir, Farhaan R, MD   1 mg at 10/02/21 1436    PHYSICAL EXAMINATION: Not performed   LABORATORY DATA:  I have reviewed the data as listed    Latest Ref Rng & Units 02/16/2024    2:41 PM 01/26/2024    1:41 PM 01/05/2024    1:11 PM  CBC  WBC 4.0 - 10.5 K/uL 7.4  6.1  6.7   Hemoglobin 12.0 - 15.0 g/dL 88.5  89.7  89.8   Hematocrit 36.0 - 46.0 % 35.6  31.8  31.2   Platelets 150 - 400 K/uL 498  463  453         Latest Ref Rng & Units 02/16/2024    2:41 PM 01/26/2024    1:41 PM 01/05/2024    1:11 PM  CMP  Glucose 70 - 99 mg/dL 895  98  92   BUN 6 - 20 mg/dL 12  12  13    Creatinine 0.44 - 1.00 mg/dL 9.19  9.37  9.32   Sodium 135 - 145 mmol/L 138  141  139   Potassium 3.5 - 5.1 mmol/L 3.6  3.6  3.8   Chloride 98 - 111 mmol/L 109  110  107   CO2 22 - 32 mmol/L 22  23  25    Calcium  8.9 - 10.3 mg/dL 9.0  8.7  8.6   Total Protein 6.5 - 8.1 g/dL 8.3  7.3  7.3   Total Bilirubin 0.0 - 1.2 mg/dL 0.4  0.4  0.4   Alkaline Phos 38 - 126 U/L 53  54  50   AST 15 - 41 U/L 13  12  11    ALT 0 - 44 U/L 12  12  13        RADIOGRAPHIC STUDIES: I have personally reviewed the radiological images as listed and agreed with the findings in the report. No results found.     I discussed the assessment and treatment plan with the patient. The patient was provided an opportunity to ask questions and all were answered. The patient agreed with the plan and demonstrated an understanding of the instructions.   The patient was advised to call back or seek an in-person evaluation if the symptoms worsen or if the condition fails to improve as anticipated.  I provided 25 minutes of non face-to-face telephone visit time during this  encounter, including review of chart and various tests results, discussions about plan of care and coordination of care plan.    [name], MD 02/29/24

## 2024-03-01 ENCOUNTER — Ambulatory Visit: Admitting: Physical Therapy

## 2024-03-02 ENCOUNTER — Ambulatory Visit: Attending: Cardiology | Admitting: Cardiology

## 2024-03-02 ENCOUNTER — Encounter: Payer: Self-pay | Admitting: Cardiology

## 2024-03-02 VITALS — BP 114/78 | HR 97 | Ht 64.0 in | Wt 357.6 lb

## 2024-03-02 DIAGNOSIS — E785 Hyperlipidemia, unspecified: Secondary | ICD-10-CM | POA: Insufficient documentation

## 2024-03-02 DIAGNOSIS — Z87898 Personal history of other specified conditions: Secondary | ICD-10-CM | POA: Insufficient documentation

## 2024-03-02 DIAGNOSIS — R600 Localized edema: Secondary | ICD-10-CM | POA: Insufficient documentation

## 2024-03-02 DIAGNOSIS — G4733 Obstructive sleep apnea (adult) (pediatric): Secondary | ICD-10-CM | POA: Diagnosis not present

## 2024-03-02 DIAGNOSIS — G4700 Insomnia, unspecified: Secondary | ICD-10-CM | POA: Diagnosis not present

## 2024-03-02 DIAGNOSIS — I10 Essential (primary) hypertension: Secondary | ICD-10-CM | POA: Insufficient documentation

## 2024-03-02 DIAGNOSIS — I2510 Atherosclerotic heart disease of native coronary artery without angina pectoris: Secondary | ICD-10-CM | POA: Diagnosis not present

## 2024-03-02 DIAGNOSIS — G4730 Sleep apnea, unspecified: Secondary | ICD-10-CM | POA: Diagnosis not present

## 2024-03-02 NOTE — Patient Instructions (Signed)
 Medication Instructions:  Your physician recommends that you continue on your current medications as directed. Please refer to the Current Medication list given to you today. *If you need a refill on your cardiac medications before your next appointment, please call your pharmacy*  Lab Work: None ordered If you have labs (blood work) drawn today and your tests are completely normal, you will receive your results only by: MyChart Message (if you have MyChart) OR A paper copy in the mail If you have any lab test that is abnormal or we need to change your treatment, we will call you to review the results.  Testing/Procedures: None   Follow-Up: At [facility], you and your health needs are our priority.  As part of our continuing mission to provide you with exceptional heart care, our providers are all part of one team.  This team includes your primary Cardiologist (physician) and Advanced Practice Providers or APPs (Physician Assistants and Nurse Practitioners) who all work together to provide you with the care you need, when you need it.  Your next appointment:   3 month(s)  Provider:   Lonni LITTIE Nanas, MD    We recommend signing up for the patient portal called MyChart.  Sign up information is provided on this After Visit Summary.  MyChart is used to connect with patients for Virtual Visits (Telemedicine).  Patients are able to view lab/test results, encounter notes, upcoming appointments, etc.  Non-urgent messages can be sent to your provider as well.   To learn more about what you can do with MyChart, go to [email or web address].   Other Instructions

## 2024-03-03 ENCOUNTER — Encounter: Payer: Self-pay | Admitting: Cardiology

## 2024-03-05 ENCOUNTER — Encounter

## 2024-03-05 ENCOUNTER — Other Ambulatory Visit: Payer: Self-pay

## 2024-03-05 NOTE — Progress Notes (Signed)
 Verbal order w/readback order from Dr. Lanny for [employer] to be drawn on 03/08/2024.  Order placed in [employer] and in [employer] portal.  [employer] kit and requisition given to [employer].

## 2024-03-06 ENCOUNTER — Other Ambulatory Visit: Payer: Self-pay

## 2024-03-06 DIAGNOSIS — C187 Malignant neoplasm of sigmoid colon: Secondary | ICD-10-CM

## 2024-03-06 DIAGNOSIS — C786 Secondary malignant neoplasm of retroperitoneum and peritoneum: Secondary | ICD-10-CM

## 2024-03-07 ENCOUNTER — Encounter

## 2024-03-07 ENCOUNTER — Other Ambulatory Visit: Payer: Self-pay

## 2024-03-07 DIAGNOSIS — C786 Secondary malignant neoplasm of retroperitoneum and peritoneum: Secondary | ICD-10-CM

## 2024-03-07 DIAGNOSIS — C187 Malignant neoplasm of sigmoid colon: Secondary | ICD-10-CM

## 2024-03-08 ENCOUNTER — Inpatient Hospital Stay

## 2024-03-08 ENCOUNTER — Inpatient Hospital Stay: Attending: Hematology

## 2024-03-08 ENCOUNTER — Inpatient Hospital Stay: Admitting: Hematology

## 2024-03-08 ENCOUNTER — Encounter: Payer: Self-pay | Admitting: Hematology

## 2024-03-08 VITALS — BP 138/87 | HR 87 | Temp 99.2°F | Resp 20 | Wt 359.5 lb

## 2024-03-08 DIAGNOSIS — J90 Pleural effusion, not elsewhere classified: Secondary | ICD-10-CM | POA: Insufficient documentation

## 2024-03-08 DIAGNOSIS — C786 Secondary malignant neoplasm of retroperitoneum and peritoneum: Secondary | ICD-10-CM | POA: Insufficient documentation

## 2024-03-08 DIAGNOSIS — K6819 Other retroperitoneal abscess: Secondary | ICD-10-CM | POA: Insufficient documentation

## 2024-03-08 DIAGNOSIS — Z7982 Long term (current) use of aspirin: Secondary | ICD-10-CM | POA: Diagnosis not present

## 2024-03-08 DIAGNOSIS — K760 Fatty (change of) liver, not elsewhere classified: Secondary | ICD-10-CM | POA: Diagnosis not present

## 2024-03-08 DIAGNOSIS — I2510 Atherosclerotic heart disease of native coronary artery without angina pectoris: Secondary | ICD-10-CM | POA: Diagnosis not present

## 2024-03-08 DIAGNOSIS — R252 Cramp and spasm: Secondary | ICD-10-CM | POA: Diagnosis not present

## 2024-03-08 DIAGNOSIS — N1330 Unspecified hydronephrosis: Secondary | ICD-10-CM | POA: Insufficient documentation

## 2024-03-08 DIAGNOSIS — R911 Solitary pulmonary nodule: Secondary | ICD-10-CM | POA: Insufficient documentation

## 2024-03-08 DIAGNOSIS — Z933 Colostomy status: Secondary | ICD-10-CM | POA: Diagnosis not present

## 2024-03-08 DIAGNOSIS — Z791 Long term (current) use of non-steroidal anti-inflammatories (NSAID): Secondary | ICD-10-CM | POA: Diagnosis not present

## 2024-03-08 DIAGNOSIS — K8020 Calculus of gallbladder without cholecystitis without obstruction: Secondary | ICD-10-CM | POA: Insufficient documentation

## 2024-03-08 DIAGNOSIS — Z79899 Other long term (current) drug therapy: Secondary | ICD-10-CM | POA: Diagnosis not present

## 2024-03-08 DIAGNOSIS — R590 Localized enlarged lymph nodes: Secondary | ICD-10-CM | POA: Diagnosis not present

## 2024-03-08 DIAGNOSIS — D3502 Benign neoplasm of left adrenal gland: Secondary | ICD-10-CM | POA: Diagnosis not present

## 2024-03-08 DIAGNOSIS — C187 Malignant neoplasm of sigmoid colon: Secondary | ICD-10-CM | POA: Diagnosis not present

## 2024-03-08 DIAGNOSIS — K5720 Diverticulitis of large intestine with perforation and abscess without bleeding: Secondary | ICD-10-CM | POA: Diagnosis not present

## 2024-03-08 DIAGNOSIS — Z1509 Genetic susceptibility to other malignant neoplasm: Secondary | ICD-10-CM | POA: Insufficient documentation

## 2024-03-08 DIAGNOSIS — C7951 Secondary malignant neoplasm of bone: Secondary | ICD-10-CM | POA: Diagnosis not present

## 2024-03-08 DIAGNOSIS — J9811 Atelectasis: Secondary | ICD-10-CM | POA: Diagnosis not present

## 2024-03-08 DIAGNOSIS — Z5112 Encounter for antineoplastic immunotherapy: Secondary | ICD-10-CM | POA: Diagnosis not present

## 2024-03-08 LAB — CBC WITH DIFFERENTIAL (CANCER CENTER ONLY)
Abs Immature Granulocytes: 0.02 K/uL (ref 0.00–0.07)
Basophils Absolute: 0 K/uL (ref 0.0–0.1)
Basophils Relative: 1 %
Eosinophils Absolute: 0.1 K/uL (ref 0.0–0.5)
Eosinophils Relative: 2 %
HCT: 32.1 % — ABNORMAL LOW (ref 36.0–46.0)
Hemoglobin: 10.3 g/dL — ABNORMAL LOW (ref 12.0–15.0)
Immature Granulocytes: 0 %
Lymphocytes Relative: 28 %
Lymphs Abs: 1.8 K/uL (ref 0.7–4.0)
MCH: 25.4 pg — ABNORMAL LOW (ref 26.0–34.0)
MCHC: 32.1 g/dL (ref 30.0–36.0)
MCV: 79.3 fL — ABNORMAL LOW (ref 80.0–100.0)
Monocytes Absolute: 0.5 K/uL (ref 0.1–1.0)
Monocytes Relative: 7 %
Neutro Abs: 4.1 K/uL (ref 1.7–7.7)
Neutrophils Relative %: 62 %
Platelet Count: 481 K/uL — ABNORMAL HIGH (ref 150–400)
RBC: 4.05 MIL/uL (ref 3.87–5.11)
RDW: 15.9 % — ABNORMAL HIGH (ref 11.5–15.5)
WBC Count: 6.6 K/uL (ref 4.0–10.5)
nRBC: 0 % (ref 0.0–0.2)

## 2024-03-08 LAB — GENETIC SCREENING ORDER

## 2024-03-08 LAB — CEA (ACCESS): CEA (CHCC): <1 ng/mL (ref 0.00–5.00)

## 2024-03-08 LAB — CMP (CANCER CENTER ONLY)
ALT: 13 U/L (ref 0–44)
AST: 11 U/L — ABNORMAL LOW (ref 15–41)
Albumin: 4.1 g/dL (ref 3.5–5.0)
Alkaline Phosphatase: 44 U/L (ref 38–126)
Anion gap: 6 (ref 5–15)
BUN: 19 mg/dL (ref 6–20)
CO2: 23 mmol/L (ref 22–32)
Calcium: 9.2 mg/dL (ref 8.9–10.3)
Chloride: 109 mmol/L (ref 98–111)
Creatinine: 0.89 mg/dL (ref 0.44–1.00)
GFR, Estimated: >60 mL/min (ref >60)
Glucose, Bld: 96 mg/dL (ref 70–99)
Potassium: 4 mmol/L (ref 3.5–5.1)
Sodium: 138 mmol/L (ref 135–145)
Total Bilirubin: 0.3 mg/dL (ref 0.0–1.2)
Total Protein: 7.7 g/dL (ref 6.5–8.1)

## 2024-03-08 MED ORDER — SODIUM CHLORIDE 0.9 % IV SOLN: Freq: Once | INTRAVENOUS | Status: AC

## 2024-03-08 MED ORDER — SODIUM CHLORIDE 0.9 % IV SOLN
200.0000 mg | Freq: Once | INTRAVENOUS | Status: AC
  Administered 2024-03-08: 200 mg via INTRAVENOUS
  Filled 2024-03-08: qty 200

## 2024-03-08 NOTE — Patient Instructions (Signed)

## 2024-03-09 ENCOUNTER — Encounter (HOSPITAL_BASED_OUTPATIENT_CLINIC_OR_DEPARTMENT_OTHER): Admitting: Physical Therapy

## 2024-03-12 ENCOUNTER — Encounter: Payer: Self-pay | Admitting: Hematology

## 2024-03-12 ENCOUNTER — Ambulatory Visit (HOSPITAL_COMMUNITY)
Admission: RE | Admit: 2024-03-12 | Discharge: 2024-03-12 | Disposition: A | Discharge status: Home / Self Care | Source: Ambulatory Visit | Attending: Hematology | Admitting: Hematology

## 2024-03-12 DIAGNOSIS — C187 Malignant neoplasm of sigmoid colon: Secondary | ICD-10-CM | POA: Diagnosis present

## 2024-03-12 DIAGNOSIS — C786 Secondary malignant neoplasm of retroperitoneum and peritoneum: Secondary | ICD-10-CM | POA: Diagnosis not present

## 2024-03-12 DIAGNOSIS — D259 Leiomyoma of uterus, unspecified: Secondary | ICD-10-CM | POA: Diagnosis not present

## 2024-03-12 DIAGNOSIS — N854 Malposition of uterus: Secondary | ICD-10-CM | POA: Diagnosis not present

## 2024-03-12 DIAGNOSIS — N838 Other noninflammatory disorders of ovary, fallopian tube and broad ligament: Secondary | ICD-10-CM | POA: Diagnosis not present

## 2024-03-19 ENCOUNTER — Other Ambulatory Visit: Payer: Self-pay | Admitting: Student

## 2024-03-26 DIAGNOSIS — Z933 Colostomy status: Secondary | ICD-10-CM | POA: Diagnosis not present

## 2024-03-29 ENCOUNTER — Inpatient Hospital Stay

## 2024-03-29 ENCOUNTER — Encounter: Payer: Self-pay | Admitting: Hematology

## 2024-03-29 ENCOUNTER — Other Ambulatory Visit: Payer: Self-pay | Admitting: *Deleted

## 2024-03-29 ENCOUNTER — Inpatient Hospital Stay (HOSPITAL_BASED_OUTPATIENT_CLINIC_OR_DEPARTMENT_OTHER): Admitting: Hematology

## 2024-03-29 ENCOUNTER — Other Ambulatory Visit: Payer: Self-pay

## 2024-03-29 VITALS — BP 122/88 | HR 89 | Temp 98.7°F | Resp 16 | Ht 64.0 in | Wt 361.8 lb

## 2024-03-29 DIAGNOSIS — J90 Pleural effusion, not elsewhere classified: Secondary | ICD-10-CM | POA: Diagnosis not present

## 2024-03-29 DIAGNOSIS — C187 Malignant neoplasm of sigmoid colon: Secondary | ICD-10-CM

## 2024-03-29 DIAGNOSIS — C786 Secondary malignant neoplasm of retroperitoneum and peritoneum: Secondary | ICD-10-CM | POA: Diagnosis not present

## 2024-03-29 DIAGNOSIS — R252 Cramp and spasm: Secondary | ICD-10-CM | POA: Diagnosis not present

## 2024-03-29 DIAGNOSIS — N1330 Unspecified hydronephrosis: Secondary | ICD-10-CM | POA: Diagnosis not present

## 2024-03-29 DIAGNOSIS — R911 Solitary pulmonary nodule: Secondary | ICD-10-CM | POA: Diagnosis not present

## 2024-03-29 DIAGNOSIS — C7951 Secondary malignant neoplasm of bone: Secondary | ICD-10-CM | POA: Diagnosis not present

## 2024-03-29 DIAGNOSIS — Z7982 Long term (current) use of aspirin: Secondary | ICD-10-CM | POA: Diagnosis not present

## 2024-03-29 DIAGNOSIS — R590 Localized enlarged lymph nodes: Secondary | ICD-10-CM | POA: Diagnosis not present

## 2024-03-29 DIAGNOSIS — Z1509 Genetic susceptibility to other malignant neoplasm: Secondary | ICD-10-CM | POA: Diagnosis not present

## 2024-03-29 DIAGNOSIS — Z5112 Encounter for antineoplastic immunotherapy: Secondary | ICD-10-CM | POA: Diagnosis not present

## 2024-03-29 DIAGNOSIS — K8020 Calculus of gallbladder without cholecystitis without obstruction: Secondary | ICD-10-CM | POA: Diagnosis not present

## 2024-03-29 DIAGNOSIS — K5720 Diverticulitis of large intestine with perforation and abscess without bleeding: Secondary | ICD-10-CM | POA: Diagnosis not present

## 2024-03-29 DIAGNOSIS — I2510 Atherosclerotic heart disease of native coronary artery without angina pectoris: Secondary | ICD-10-CM | POA: Diagnosis not present

## 2024-03-29 DIAGNOSIS — Z79899 Other long term (current) drug therapy: Secondary | ICD-10-CM | POA: Diagnosis not present

## 2024-03-29 DIAGNOSIS — Z933 Colostomy status: Secondary | ICD-10-CM | POA: Diagnosis not present

## 2024-03-29 DIAGNOSIS — K6819 Other retroperitoneal abscess: Secondary | ICD-10-CM | POA: Diagnosis not present

## 2024-03-29 DIAGNOSIS — Z791 Long term (current) use of non-steroidal anti-inflammatories (NSAID): Secondary | ICD-10-CM | POA: Diagnosis not present

## 2024-03-29 DIAGNOSIS — K760 Fatty (change of) liver, not elsewhere classified: Secondary | ICD-10-CM | POA: Diagnosis not present

## 2024-03-29 DIAGNOSIS — J9811 Atelectasis: Secondary | ICD-10-CM | POA: Diagnosis not present

## 2024-03-29 DIAGNOSIS — D3502 Benign neoplasm of left adrenal gland: Secondary | ICD-10-CM | POA: Diagnosis not present

## 2024-03-29 LAB — CBC WITH DIFFERENTIAL (CANCER CENTER ONLY)
Abs Immature Granulocytes: 0.02 K/uL (ref 0.00–0.07)
Basophils Absolute: 0 K/uL (ref 0.0–0.1)
Basophils Relative: 1 %
Eosinophils Absolute: 0.1 K/uL (ref 0.0–0.5)
Eosinophils Relative: 1 %
HCT: 32 % — ABNORMAL LOW (ref 36.0–46.0)
Hemoglobin: 10.3 g/dL — ABNORMAL LOW (ref 12.0–15.0)
Immature Granulocytes: 0 %
Lymphocytes Relative: 21 %
Lymphs Abs: 1.6 K/uL (ref 0.7–4.0)
MCH: 25.2 pg — ABNORMAL LOW (ref 26.0–34.0)
MCHC: 32.2 g/dL (ref 30.0–36.0)
MCV: 78.2 fL — ABNORMAL LOW (ref 80.0–100.0)
Monocytes Absolute: 0.5 K/uL (ref 0.1–1.0)
Monocytes Relative: 6 %
Neutro Abs: 5.5 K/uL (ref 1.7–7.7)
Neutrophils Relative %: 71 %
Platelet Count: 492 K/uL — ABNORMAL HIGH (ref 150–400)
RBC: 4.09 MIL/uL (ref 3.87–5.11)
RDW: 16.4 % — ABNORMAL HIGH (ref 11.5–15.5)
WBC Count: 7.7 K/uL (ref 4.0–10.5)
nRBC: 0 % (ref 0.0–0.2)

## 2024-03-29 LAB — CMP (CANCER CENTER ONLY)
ALT: 12 U/L (ref 0–44)
AST: 12 U/L — ABNORMAL LOW (ref 15–41)
Albumin: 3.9 g/dL (ref 3.5–5.0)
Alkaline Phosphatase: 49 U/L (ref 38–126)
Anion gap: 8 (ref 5–15)
BUN: 19 mg/dL (ref 6–20)
CO2: 24 mmol/L (ref 22–32)
Calcium: 8.7 mg/dL — ABNORMAL LOW (ref 8.9–10.3)
Chloride: 107 mmol/L (ref 98–111)
Creatinine: 0.9 mg/dL (ref 0.44–1.00)
GFR, Estimated: >60 mL/min (ref >60)
Glucose, Bld: 98 mg/dL (ref 70–99)
Potassium: 4 mmol/L (ref 3.5–5.1)
Sodium: 139 mmol/L (ref 135–145)
Total Bilirubin: 0.3 mg/dL (ref 0.0–1.2)
Total Protein: 7.2 g/dL (ref 6.5–8.1)

## 2024-03-29 LAB — MAGNESIUM: Magnesium: 1.8 mg/dL (ref 1.7–2.4)

## 2024-03-29 MED ORDER — SODIUM CHLORIDE 0.9 % IV SOLN: Freq: Once | INTRAVENOUS | Status: AC

## 2024-03-29 MED ORDER — SODIUM CHLORIDE 0.9 % IV SOLN
200.0000 mg | Freq: Once | INTRAVENOUS | Status: AC
  Administered 2024-03-29: 200 mg via INTRAVENOUS
  Filled 2024-03-29: qty 200

## 2024-03-29 NOTE — Progress Notes (Signed)
 [facility]   Telephone:(336) [phone] Fax:(336) [phone]   Clinic Follow up Note   Patient Care Team: Jarold Medici, MD as PCP - General (Internal Medicine) [name], MD as PCP - Cardiology (Cardiology) [name], [name] as Physician Assistant (Emergency Medicine) Lanny Callander, MD as Consulting Physician (Oncology) Sheldon Standing, MD as Consulting Physician (General Surgery) Rios, Dedra, MD as Consulting Physician (Neurology) [name], Krystal, MD as Consulting Physician (Obstetrics and Gynecology) [name], MD as Consulting Physician (Gastroenterology) [name], [name], NP as Nurse Practitioner (Nurse Practitioner)  Date of Service:  03/29/2024  CHIEF COMPLAINT: f/u of metastatic sigmoid colon cancer  CURRENT THERAPY:  Keytruda  every 3 weeks  Oncology History   Malignant neoplasm of sigmoid colon [facility]) -MMR loss of [name]6, [name] with high mutation burden, acquired BRCA mutation (+)  --Initially presented with diarrhea 11/04/20, found to have diverticulitis with perforation and also developed an abscess requiring 2 drains, multiple hospital stay  --she started FOLFOX on 06/22/21 but did not respond -she switched to Keytruda  on 09/08/21. She has been tolerating well overall but has developed joint pain which could be related, overall manageable. She is clinically doing better also with more energy and less abdominal pain   -her CEA has normalized on immunotherapy. -CT CAP on 03/15/22 again showed mixed response -she had PET scan on 04/26/2022 for evaluation of her ovarian cysts, and saw GYN Dr. [name], we decided to monitor it. -She tolerating Keytruda  well, with mild joint pain.  She recently started workout at the gym, which has helped her joint pain. -restaging CT from 08/04/2022 showed stable disease in left abdomen and pelvis, no new lesions.  I personally reviewed her scan images, and discussed findings with patient.    -Will continue Keytruda   -repeated CT scan on January 24, 2023 showed continuous response, no new lesions.  -PET on 05/04/2023 showed no definitive evidence of progression, some mildly hypermetabolic nodes, will continue monitoring, plan to stop Keytruda  in 09/2023 if no progression on next scan  -PET 08/29/2023 showed similar tiny left pericolic hypermetabolic nodules, most consistent with metastatic disease, no other definitive evidence of residual disease  -ctDNA GuardantReveal was negative on 10/13/2023  Assessment & Plan Metastatic colon cancer Metastatic colon cancer with ongoing treatment with Keytruda  for over two years. Recent scans show stable pericolonic nodules in LUQ, possibly residual cancer or scar tissue. Guardian review was negative, and Signatera results are pending. She is experiencing muscle cramps, potentially related to Keytruda . She agreed to hold treatment after today's session pending Signatera results, with the understanding that treatment will resume if disease progression is noted. - Proceed with today's treatment with Keytruda . - Do not schedule further treatments until Signatera results are available. - If Signatera is negative, cancel the November 13th treatment. - Monitor disease every three months if treatment is held. - Schedule a scan by the end of the year or January. - Continue port flush appointments.  Muscle cramps and joint pain Muscle cramps and joint pain, possibly related to Keytruda . Cramps are full body, random, and last 5-10 minutes. Muscle relaxants are ineffective, and Tylenol  3 causes drowsiness. Magnesium  deficiency is considered as a potential cause. - Recommend over-the-counter magnesium  supplementation. - Consider calcium  supplementation once a day. - Check magnesium  levels if possible. - If magnesium  is low, prescribe magnesium  supplementation.  Ovarian cyst Ovarian cyst identified on ultrasound, likely physiological and not cancer-related.  No significant change in size. Consultation with Dr. Ethyl suggests monitoring is appropriate. -  Monitor the ovarian cyst.  Plan - Lab reviewed, adequate for treatment, will proceed Keytruda  today - Due to her worsening body cramps, if her Signatera is negative, I will hold Keytruda  treatment after today and monitor her disease. - Follow-up in 6 weeks if we hold the treatment.  SUMMARY OF ONCOLOGIC HISTORY: Oncology History Overview Note   Cancer Staging  Malignant neoplasm of sigmoid colon [facility]) Staging form: Colon and Rectum, AJCC 8th Edition - Pathologic stage from 04/15/2021: Stage IIC (pT4b, pN0, cM0) - Signed by Lanny Callander, MD on 06/12/2021    Malignant neoplasm of sigmoid colon [facility])   Initial Diagnosis   Malignant neoplasm of sigmoid colon (HCC)   11/04/2020 Imaging   EXAM: CT ABDOMEN AND PELVIS WITHOUT CONTRAST  IMPRESSION: 1. Perforating descending colonic diverticulitis with multiple abdominopelvic gas and fluid collections, measuring up to 5.7 cm and further described above. 2. Cholelithiasis without findings of acute cholecystitis. 3. 3.6 cm benign left adrenal adenoma. 4. Leiomyomatous uterus. 5. 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   11/07/2020 Imaging   EXAM: CT ABDOMEN AND PELVIS WITHOUT CONTRAST  IMPRESSION: Continued wall thickening is seen involving descending colon suggesting infectious or inflammatory colitis or perforated diverticulitis. There is an adjacent fluid collection measuring 7.0 x 5.0 cm consistent with abscess which is significantly enlarged compared to prior exam. Adjacent to the abscess, there is a severely thickened small bowel loop most consistent with secondary inflammation.   5.2 x 3.5 cm fluid collection is noted within the left psoas muscle consistent with abscess which is significantly enlarged compared to prior exam. 4.4 x 3.8 cm  fluid collection consistent with abscess is noted in the left retroperitoneal region which is also enlarged compared to prior exam.   Fibroid uterus.   Cholelithiasis.   11/27/2020 Imaging   EXAM: CT ABDOMEN AND PELVIS WITHOUT CONTRAST  IMPRESSION: 1. Significantly interval decreased size of the previously visualized left retroperitoneal and left anterior mid abdominal fluid collections. There is persistent fat stranding and mural thickening of the mid descending colon at this level. 2. Unchanged leiomyomatous uterus. 3. Unchanged cholelithiasis.   12/11/2020 Imaging   EXAM: CT ABDOMEN AND PELVIS WITHOUT CONTRAST  IMPRESSION: 1. Stable inflammatory changes centered around the distal descending colon in the left lower abdomen. Pericolonic inflammatory changes have minimally changed since 11/27/2020. Small pocket of gas medial to the colon is probably associated with a fistula or small residual abscess collection. 2. Stable position of the two percutaneous drains. The more anterior drain may be extending through a portion of the small bowel. 3. Cholelithiasis. 4. Fibroid uterus. Cannot exclude an ovarian/adnexal cystic structure near the uterine fundus. 5. Left adrenal adenoma.   01/07/2021 Imaging   EXAM: CT ABDOMEN AND PELVIS WITH CONTRAST  IMPRESSION: 1. No new abscesses identified. Similar degree of soft tissue thickening seen in the left pericolonic region. The degree of persistent soft tissues thickening in the pericolonic space further raises suspicions for malignancy. Further evaluation with colonoscopy should be performed. 2. Left retroperitoneal abscess drain unchanged in position. Anterior left abdominal drain again seen terminating within small bowel loop. 3. 2.6 cm mildly sclerotic lesion noted in the L2 vertebral body. Further evaluation with contrast enhanced lumbar spine MRI should be performed.   02/04/2021 Imaging   EXAM: CT ABDOMEN AND PELVIS WITH  CONTRAST  IMPRESSION: No new abdominopelvic collections or abscess development in the 1 month interval.  Left anterior drain remains within a loop of small bowel, unchanged.   Left lateral abscess drain remains in the retroperitoneal space adjacent to the iliopsoas muscle with a small amount of residual fluid and air but no measurable collection.   Stable soft tissue prominence and pericolonic strandy edema/inflammation about the left descending colon compatible with residual diverticulitis/colitis. Difficult to exclude underlying transmural lesion.   04/01/2021 Imaging   EXAM: CT ABDOMEN AND PELVIS WITH CONTRAST  IMPRESSION: There appears to be significant enhancement and wall thickening involving the descending colon with some degree of traction and involvement of adjacent small bowel loops. This is consistent with the history of diverticulitis and perforation. Stable position of percutaneous drainage catheter is seen adjacent to left psoas muscle with no significant residual fluid remaining. The other percutaneous drainage catheter that was previously noted to be within small bowel loop on prior exam in the left lower quadrant, has significantly retracted and appears to be outside of the peritoneal space at this time.   Since the prior exam, there does appear to be some degree of rotation involving mesenteric vessels and structures in the right lower quadrant, suggesting partial volvulus or malrotation. There is the interval development of several lymph nodes in this area, most likely inflammatory or reactive in etiology. Mild amount of free fluid is also noted in the pelvis. However, no significant bowel wall thickening or dilatation is seen in this area. These results will be called to the ordering clinician or representative by the Radiologist Assistant, and communication documented in the PACS or zVision Dashboard.   Stable uterine fibroid.   Hepatic steatosis.    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   Cholelithiasis.   04/10/2021 Imaging   EXAM: CT ANGIOGRAPHY CHEST CT ABDOMEN AND PELVIS WITH CONTRAST  IMPRESSION: 1. Again seen are findings compatible with descending colon diverticulitis with perforation. Free air and inflammation have increased in the interval. 2. New lobulated enhancing fluid collection posterior to this segment of inflamed colon measuring 8.5 x 3.5 x 10.0 cm. This collection now invades the adjacent iliopsoas muscle as well as extends through the left lateral abdominal wall. The tip of the drainage catheter is in this collection. Findings are compatible with abscess. 3. Anterior left percutaneous drainage catheter tip has been pulled back and is now within the subcutaneous tissues. 4. Trace free fluid. 5. No pulmonary embolism.  No acute cardiopulmonary process. 6. Cholelithiasis. 7. Fatty infiltration of the liver.   04/15/2021 Definitive Surgery   FINAL MICROSCOPIC DIAGNOSIS:   A. SMALL BOWEL, RESECTION:  - Adenocarcinoma.  - No carcinoma identified in 1 lymph node.   B. PERFORATED LEFT COLON, RESECTION:  - Moderately differentiated colonic adenocarcinoma.  - Tumor extends into pericolonic adipose tissue, and is strongly  suggestive of invasion into small bowel.  See oncology table/comments.  - No carcinoma identified in 4 lymph nodes (0/4).  - Tubular adenoma with high-grade dysplasia, 1.  - Tubular adenomas with low grade dysplasia, 3.   Comments: The size of the tumor is difficult to estimate secondary to the disrupted nature of the specimen, as well as the infiltrative nature of the tumor.  Tumor can be identified as definitely invading into the pericolonic adipose tissue (block B4), but given the extreme disruption of the tissue, and the involvement of the small bowel by what appears to be a colonic adenocarcinoma, I favor perforation of the large bowel with direct invasion into the small bowel.  Accordingly,  I believe this is best  regarded as a pT4b lesion.   ADDENDUM:  Mismatch Repair Protein (IHC)  SUMMARY INTERPRETATION: ABNORMAL  There is loss of the major MMR protein [name]6: This indicates a high probability that a hereditary germline mutation is present and referral to genetic counseling is warranted. It is recommended that the loss of protein expression be correlated with molecular based microsatellite instability testing.   IHC EXPRESSION RESULTS  TEST           RESULT  MLH1:          Preserved nuclear expression  [name]2:          Preserved nuclear expression  [name]6:          LOSS OF NUCLEAR EXPRESSION  PMS2:          Preserved nuclear expression    04/15/2021 Cancer Staging   Staging form: Colon and Rectum, AJCC 8th Edition - Pathologic stage from 04/15/2021: Stage IIC (pT4b, pN0, cM0) - Signed by Lanny Callander, MD on 06/12/2021 Stage prefix: Initial diagnosis Total positive nodes: 0 Histologic grading system: 4 grade system Histologic grade (G): G2 Residual tumor (R): R0 - None   04/29/2021 Imaging   EXAM: CT ABDOMEN AND PELVIS WITH CONTRAST  IMPRESSION: Continued left retroperitoneal abscesses inferior to the left kidney, involving the left psoas muscle and left abdominal wall musculature. Overall size is decreased since prior study. Interval removal of left lower quadrant abscess drainage catheter.   New fluid collection in the cul-de-sac and wrapping around the uterus concerning for abscess.   Cholelithiasis.   Small left pleural effusion.  Bibasilar atelectasis.   Stable left adrenal adenoma.  ADDENDUM: After discussing the case with Dr. Katha in interventional radiology, it was noted that the fluid in the pelvis originally thought to be in the cul-de-sac is likely within the vagina, best seen on sagittal imaging. Recommend speculum exam.   Also, in the left lateral wall in the area of prior abscess in the lateral wall abdominal musculature, some of the abdomen soft tissue  appears to be enhancing. While this could be infectious, cannot exclude tumor seeding in the left lateral abdominal wall, measuring 6.9 x 3.8 cm on image 64 of series 2.   06/16/2021 Imaging   EXAM: CT ABDOMEN AND PELVIS WITH CONTRAST  IMPRESSION: Increased size of bulky soft tissue masses involving the left psoas muscle and left lateral abdominal wall soft tissues, consistent with progressive metastatic disease.   Significant progression of multiple peritoneal masses throughout the abdomen and pelvis, consistent with peritoneal carcinomatosis.   New mild retroperitoneal lymphadenopathy, consistent with metastatic disease.   Stable uterine fibroid and left adrenal adenoma.   Cholelithiasis, without evidence of cholecystitis.   06/22/2021 - 08/19/2021 Chemotherapy   Patient is on Treatment Plan : COLORECTAL FOLFOX q14d x 6 months     06/22/2021 Tumor Marker   Patient's tumor was tested for the following markers: CEA. Results of the tumor marker test revealed 87.41.   07/10/2021 Pathology Results   FINAL MICROSCOPIC DIAGNOSIS:   A. PERITONEAL MASS, LEFT UPPER ABDOMINAL QUADRANT, BIOPSY:  -  Metastatic adenocarcinoma with necrosis, histologically consistent with colon primary.     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 The report date is 07/16/2021. The remainder of testing was negative/normal.  The CancerNext-Expanded + RNAinsight gene panel offered by [employer] and includes sequencing and rearrangement analysis for the following 77 genes: IP, ALK, APC*, ATM*, AXIN2, BAP1, BARD1, BLM, BMPR1A,  BRCA1*, BRCA2*, BRIP1*, CDC73, CDH1*,CDK4, CDKN1B, CDKN2A, CHEK2*, CTNNA1, DICER1, FANCC, FH, FLCN, GALNT12, KIF1B, LZTR1, MAX, MEN1, MET, MLH1*, [name]2*, [name]3, [name]6*, MUTYH*, NBN, NF1*, NF2, NTHL1, PALB2*, PHOX2B, PMS2*, POT1, PRKAR1A, PTCH1, PTEN*, RAD51C*, RAD51D*,RB1, RECQL, RET, SDHA, SDHAF2, SDHB, SDHC, SDHD, SMAD4,  SMARCA4, SMARCB1, SMARCE1, STK11, SUFU, TMEM127, TP53*,TSC1, TSC2, VHL and XRCC2 (sequencing and deletion/duplication); EGFR, EGLN1, HOXB13, KIT, MITF, PDGFRA, POLD1 and POLE (sequencing only); EPCAM and GREM1 (deletion/duplication only).   08/28/2021 Imaging   EXAM: CT CHEST, ABDOMEN, AND PELVIS WITH CONTRAST  IMPRESSION: 1. Continued interval progression of multiple metastatic soft tissue nodules/masses in the abdomen and pelvis. 2. No evidence for metastatic disease in the chest. 3. Similar appearance of the subtle lesion in the L2 vertebral body, suspicious for metastatic involvement. 4. Cholelithiasis.   09/03/2021 -  Chemotherapy   Patient is on Treatment Plan : COLORECTAL Pembrolizumab  (200) q21d     09/08/2021 - 02/01/2022 Chemotherapy   Patient is on Treatment Plan : COLORECTAL Pembrolizumab  (200) q21d     01/08/2022 Imaging   CT CAP IMPRESSION: 1. Mixed response to therapy. Interval decrease in size of the conglomerate multilobulated mass extending from the left psoas into the left posterior pararenal space through the left lateral abdominal wall along the tract of the previously placed percutaneous drainage catheter. Interval decrease in size of right adnexal mass and omental masses within the right lower quadrant of the abdomen. Interval development of peritoneal carcinomatosis with omental caking within the left lower quadrant of the abdomen and left subdiaphragmatic region adjacent to the spleen and along the left pericolic gutter as well as development of ascites appearing loculated within the anterior peritoneum. 2. Interval development of mild left hydronephrosis secondary to stricturing of the mid left ureter. 3. Stable left adrenal metastasis. 4. Stable sclerotic lesion within the L2 vertebral body. No new lytic or blastic bone lesions are seen. 5. Cholelithiasis. 6. No evidence of intrathoracic metastatic disease. 7. 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 Moderate colonic stool burden. No evidence of obstruction.   03/15/2022 Imaging   IMPRESSION: 1. Status post sigmoid colon resection with left lower quadrant end colostomy. 2. Multicystic bilateral ovarian lesions are slightly increased in size. 3. Diminished, small volume of loculated appearing ascites throughout the abdomen and pelvis, with similar appearance of peritoneal thickening and nodularity throughout. 4. Heterogeneously calcified, conglomerate masses in the left retroperitoneum and abdominal wall are slightly diminished in size. 5. Left adrenal lesion not significantly changed. 6. Unchanged faintly sclerotic metastatic lesion of L2. 7. Constellation of findings is consistent with mixed response to treatment however with overall little interval change. 8. Minimal residual left hydronephrosis and proximal hydroureter, improved compared to prior examination. 9. Cholelithiasis. 10. Coronary artery disease, significantly advanced for patient age.   08/04/2022 Imaging    IMPRESSION: CT CHEST:   1. No developing mass lesion, fluid collection or lymph node enlargement in the thorax. Stable tiny 3 mm lung nodule. 2. Coronary artery calcifications. Please correlate for other coronary risk factors. 3. Chest port   CT ABDOMEN AND PELVIS:   1. Extensive surgical changes identified. Left lower quadrant ostomy, diverting colostomy. Surgical changes along loops of small bowel in the right hemiabdomen. 2. Once again there are areas of nodular tissue extending along the left lateral abdominal wall, into the retroperitoneum which are similar to previous. The amount of adjacent fluid in these locations has improved. 3. No new mass lesion, fluid collection or  lymph node enlargement in the abdomen or pelvis. 4. Gallstones. 5. Enlarged uterus with fibroids. Bilateral complex cystic areas are once again seen and based on appearance  differential includes dilated fallopian tubes, hydrosalpinx versus true cystic lesions. Please correlate for any known history or prior workup 6. Limited evaluation for solid organ pathology metastatic disease without the advantage of IV contrast     05/04/2023 PET scan   PET - restaging - skull base to thigh  IMPRESSION: 1. Subcentimeter peritoneal nodules in the left abdomen have decreased in size and hypermetabolism from 04/26/2022, compatible with treated metastatic disease. 2. Chronic focal hypermetabolism associated with a gallbladder nodule. Malignancy cannot be excluded. 3. Small hypermetabolic to borderline hypermetabolic external iliac and left inguinal lymph nodes and minimally hypermetabolic small bilateral cervical lymph nodes, nonspecific. Metastatic disease not excluded. Recommend attention on follow-up. 4. Left adrenal adenoma. 5. Cystic right adnexal mass is inadequately characterized. Because this lesion is not adequately characterized, prompt US  is recommended for further evaluation. Note: This recommendation does not apply to premenarchal patients and to those with increased risk (genetic, family history, elevated tumor markers or other high-risk factors) of ovarian cancer. Reference: JACR 2020 Feb; 17(2):248-254 6. 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 No developing mass lesion, fluid collection or lymph node enlargement in the thorax. Stable tiny 3 mm lung nodule. 2. Coronary artery calcifications. Please correlate for other coronary risk factors. 3. Chest port   CT ABDOMEN AND PELVIS:   1. Extensive surgical changes identified. Left lower quadrant ostomy,  diverting colostomy. Surgical changes along loops of small bowel in the right hemiabdomen. 2. Once again there are areas of nodular tissue extending along the left lateral abdominal wall, into the retroperitoneum which are similar to previous. The amount of adjacent fluid in these locations has improved. 3. No new mass lesion, fluid collection or lymph node enlargement in the abdomen or pelvis. 4. Gallstones. 5. Enlarged uterus with fibroids. Bilateral complex cystic areas are once again seen and based on appearance differential includes dilated fallopian tubes, hydrosalpinx versus true cystic lesions. Please correlate for any known history or prior workup 6. Limited evaluation for solid organ pathology metastatic disease without the advantage of IV contrast        Discussed the use of AI scribe software for clinical note transcription with the patient, who gave verbal consent to proceed.  History of Present Illness Mikayla Rios is a 38 year old female with metastatic colon cancer who presents for follow-up.  She experiences worsening joint pain and full body cramps from the neck down, lasting five to ten minutes. These cramps are random, triggered by movement, and cause her to feel 'stuck'. Physical therapy exacerbates the cramping, and home treatments like ice, heat, and a massager provide minimal relief. A vibration device helps with leg cramps, which sometimes cause her legs to 'lock up'.  She has been on Keytruda  for over two years, starting in April 2023. She discontinued Tylenol  3 a week ago due to drowsiness, and muscle relaxants have been ineffective. She inquires about checking her magnesium  levels, suspecting a link to her cramps. Her potassium and hemoglobin levels have been normal, with  current results pending.     All other systems were reviewed with the patient and are negative.  MEDICAL HISTORY:  Past Medical History:  Diagnosis Date   Allergy    seasonal    Anemia    Blood infection 1985   Blood transfusion without reported diagnosis    colon ca 04/2021   Diverticulitis    Family history of breast cancer    Family history of colon cancer    Family history of colon cancer    Family history of stomach cancer    Hypertension    Obesity    Sleep apnea     SURGICAL HISTORY: Past Surgical History:  Procedure Laterality Date   COLECTOMY WITH COLOSTOMY CREATION/HARTMANN PROCEDURE N/A 04/15/2021   Procedure: COLOSTOMY CREATION/HARTMANN PROCEDURE;  Surgeon: Sheldon Standing, MD;  Location: [facility];  Service: General;  Laterality: N/A;   IR CATHETER TUBE CHANGE  12/12/2020   IR CATHETER TUBE CHANGE  01/27/2021   IR CATHETER TUBE CHANGE  02/19/2021   IR CATHETER TUBE CHANGE  04/13/2021   IR IMAGING GUIDED PORT INSERTION  10/02/2021   IR RADIOLOGIST EVAL & MGMT  11/27/2020   IR RADIOLOGIST EVAL & MGMT  12/11/2020   IR RADIOLOGIST EVAL & MGMT  01/07/2021   IR RADIOLOGIST EVAL & MGMT  02/04/2021   IR SINUS/FIST TUBE CHK-NON GI  12/12/2020   IR SINUS/FIST TUBE CHK-NON GI  02/19/2021   LAPAROSCOPIC PARTIAL COLECTOMY N/A 04/15/2021   Procedure: LAPAROSCOPIC ASSISTED HARTMANN RESECTION;  Surgeon: Sheldon Standing, MD;  Location: [facility];  Service: General;  Laterality: N/A;    I have reviewed the social history and family history with the patient and they are unchanged from previous note.  ALLERGIES:  is allergic to chlorhexidine  gluconate, shellfish allergy, and penicillins.  MEDICATIONS:  Current Outpatient Medications  Medication Sig Dispense Refill   acetaminophen -codeine  (TYLENOL  #3) 300-30 MG tablet Take 1 tablet by mouth every 8 (eight) hours as needed for moderate pain (pain score 4-6). 30 tablet 0   ALPRAZolam  (XANAX ) 0.25 MG tablet Take 1 tablet (0.25 mg total) by mouth at bedtime as needed for anxiety. 30 tablet 0   amLODipine  (NORVASC ) 10 MG tablet TAKE 1 TABLET BY MOUTH EVERY DAY 90 tablet 3   aspirin  EC 81 MG tablet Take 1 tablet (81 mg  total) by mouth daily. Swallow whole.     atorvastatin  (LIPITOR) 40 MG tablet TAKE 1 TABLET BY MOUTH EVERY DAY 90 tablet 2   carvedilol  (COREG ) 12.5 MG tablet TAKE 1.5 TABLETS BY MOUTH 2 (TWO) TIMES DAILY. 270 tablet 2   diclofenac  Sodium (VOLTAREN ) 1 % GEL Apply 2 g topically 4 (four) times daily. 100 g 2   dicyclomine  (BENTYL ) 10 MG capsule Take 1 capsule (10 mg total) by mouth every 8 (eight) hours as needed for spasms. 20 capsule 0   furosemide  (LASIX ) 20 MG tablet TAKE 1 TABLET BY MOUTH EVERY DAY AS NEEDED 90 tablet 1   gabapentin  (NEURONTIN ) 300 MG capsule Take 1 capsule (300 mg total) by mouth at bedtime. 90 capsule 1   KLOR-CON  M20 20 MEQ tablet TAKE 1 TABLET BY MOUTH EVERY DAY 90 tablet 1   lidocaine -prilocaine  (EMLA ) cream Apply 1 Application topically as needed. 30 g 3   methocarbamol  (ROBAXIN -750) 750 MG tablet Take 1 tablet (750 mg total) by mouth every 8 (eight) hours as needed for muscle spasms. 60 tablet 0   prochlorperazine  (COMPAZINE ) 10 MG tablet Take 1 tablet (10 mg total) by  mouth every 8 (eight) hours as needed for nausea or vomiting. 30 tablet 0   traZODone  (DESYREL ) 50 MG tablet TAKE 1-2 TABLETS BY MOUTH AT BEDTIME AS NEEDED FOR SLEEP. 180 tablet 1   triamcinolone  cream (KENALOG ) 0.1 % APPLY TO AFFECTED AREA TWICE DAILY AS NEEDED 30 g 0   valsartan  (DIOVAN ) 320 MG tablet Take 1 tablet (320 mg total) by mouth daily. 30 tablet 6   No current facility-administered medications for this visit.   Facility-Administered Medications Ordered in Other Visits  Medication Dose Route Frequency Provider Last Rate Last Admin   fentaNYL  (SUBLIMAZE ) injection   Intravenous PRN Mir, Farhaan R, MD   50 mcg at 10/02/21 1436   midazolam  (VERSED ) injection   Intravenous PRN Mir, Farhaan R, MD   1 mg at 10/02/21 1436    PHYSICAL EXAMINATION: ECOG PERFORMANCE STATUS: 1 - Symptomatic but completely ambulatory  Vitals:   03/29/24 1300  BP: 122/88  Pulse: 89  Resp: 16  Temp: 98.7 F (37.1  C)  SpO2: 97%   Wt Readings from Last 3 Encounters:  03/29/24 (!) 361 lb 12.8 oz (164.1 kg)  03/08/24 (!) 359 lb 8 oz (163.1 kg)  03/02/24 (!) 357 lb 9.6 oz (162.2 kg)     GENERAL:alert, no distress and comfortable SKIN: skin color, texture, turgor are normal, no rashes or significant lesions EYES: normal, Conjunctiva are pink and non-injected, sclera clear NECK: supple, thyroid  normal size, non-tender, without nodularity LYMPH:  no palpable lymphadenopathy in the cervical, axillary  LUNGS: clear to auscultation and percussion with normal breathing effort HEART: regular rate & rhythm and no murmurs and no lower extremity edema ABDOMEN:abdomen soft, non-tender and normal bowel sounds Musculoskeletal:no cyanosis of digits and no clubbing  NEURO: alert & oriented x 3 with fluent speech, no focal motor/sensory deficits  Physical Exam    LABORATORY DATA:  I have reviewed the data as listed    Latest Ref Rng & Units 03/29/2024   12:43 PM 03/08/2024    1:16 PM 02/16/2024    2:41 PM  CBC  WBC 4.0 - 10.5 K/uL 7.7  6.6  7.4   Hemoglobin 12.0 - 15.0 g/dL 89.6  89.6  88.5   Hematocrit 36.0 - 46.0 % 32.0  32.1  35.6   Platelets 150 - 400 K/uL 492  481  498         Latest Ref Rng & Units 03/29/2024   12:43 PM 03/08/2024    1:16 PM 02/16/2024    2:41 PM  CMP  Glucose 70 - 99 mg/dL 98  96  895   BUN 6 - 20 mg/dL 19  19  12    Creatinine 0.44 - 1.00 mg/dL 9.09  9.10  9.19   Sodium 135 - 145 mmol/L 139  138  138   Potassium 3.5 - 5.1 mmol/L 4.0  4.0  3.6   Chloride 98 - 111 mmol/L 107  109  109   CO2 22 - 32 mmol/L 24  23  22    Calcium  8.9 - 10.3 mg/dL 8.7  9.2  9.0   Total Protein 6.5 - 8.1 g/dL 7.2  7.7  8.3   Total Bilirubin 0.0 - 1.2 mg/dL 0.3  0.3  0.4   Alkaline Phos 38 - 126 U/L 49  44  53   AST 15 - 41 U/L 12  11  13    ALT 0 - 44 U/L 12  13  12        RADIOGRAPHIC STUDIES:  I have personally reviewed the radiological images as listed and agreed with the findings in the  report. No results found.    No orders of the defined types were placed in this encounter.  All questions were answered. The patient knows to call the clinic with any problems, questions or concerns. No barriers to learning was detected. The total time spent in the appointment was 25 minutes, including review of chart and various tests results, discussions about plan of care and coordination of care plan     [name], MD 03/29/2024

## 2024-03-29 NOTE — Patient Instructions (Signed)

## 2024-03-29 NOTE — Assessment & Plan Note (Signed)
-  MMR loss of [name]6, [name] with high mutation burden, acquired BRCA mutation (+)  --Initially presented with diarrhea 11/04/20, found to have diverticulitis with perforation and also developed an abscess requiring 2 drains, multiple hospital stay  --she started FOLFOX on 06/22/21 but did not respond -she switched to Keytruda  on 09/08/21. She has been tolerating well overall but has developed joint pain which could be related, overall manageable. She is clinically doing better also with more energy and less abdominal pain   -her CEA has normalized on immunotherapy. -CT CAP on 03/15/22 again showed mixed response -she had PET scan on 04/26/2022 for evaluation of her ovarian cysts, and saw GYN Dr. Daisey Dryer, we decided to monitor it. -She tolerating Keytruda  well, with mild joint pain.  She recently started workout at the gym, which has helped her joint pain. -restaging CT from 08/04/2022 showed stable disease in left abdomen and pelvis, no new lesions.  I personally reviewed her scan images, and discussed findings with patient.   -Will continue Keytruda   -repeated CT scan on January 24, 2023 showed continuous response, no new lesions.  -PET on 05/04/2023 showed no definitive evidence of progression, some mildly hypermetabolic nodes, will continue monitoring, plan to stop Keytruda  in 09/2023 if no progression on next scan  -PET 08/29/2023 showed similar tiny left pericolic hypermetabolic nodules, most consistent with metastatic disease, no other definitive evidence of residual disease  -ctDNA GuardantReveal was negative on 10/13/2023

## 2024-04-10 ENCOUNTER — Encounter: Payer: Self-pay | Admitting: Hematology

## 2024-04-10 ENCOUNTER — Other Ambulatory Visit: Payer: Self-pay

## 2024-04-10 ENCOUNTER — Other Ambulatory Visit (HOSPITAL_COMMUNITY): Payer: Self-pay

## 2024-04-10 MED ORDER — VALSARTAN 320 MG PO TABS
320.0000 mg | ORAL_TABLET | Freq: Every day | ORAL | 3 refills | Status: AC
  Filled 2024-04-10 – 2024-05-06 (×3): qty 30, 30d supply, fill #0
  Filled 2024-06-05 – 2024-06-19 (×3): qty 30, 30d supply, fill #1

## 2024-04-18 ENCOUNTER — Encounter: Payer: Self-pay | Admitting: Hematology

## 2024-04-19 ENCOUNTER — Inpatient Hospital Stay: Attending: Hematology

## 2024-04-19 ENCOUNTER — Inpatient Hospital Stay (HOSPITAL_BASED_OUTPATIENT_CLINIC_OR_DEPARTMENT_OTHER): Admitting: Hematology

## 2024-04-19 ENCOUNTER — Inpatient Hospital Stay

## 2024-04-19 ENCOUNTER — Other Ambulatory Visit: Payer: Self-pay | Admitting: *Deleted

## 2024-04-19 ENCOUNTER — Encounter: Payer: Self-pay | Admitting: Hematology

## 2024-04-19 VITALS — BP 124/76 | HR 92 | Temp 98.3°F | Resp 16 | Ht 64.0 in | Wt 362.2 lb

## 2024-04-19 DIAGNOSIS — R112 Nausea with vomiting, unspecified: Secondary | ICD-10-CM | POA: Insufficient documentation

## 2024-04-19 DIAGNOSIS — J029 Acute pharyngitis, unspecified: Secondary | ICD-10-CM | POA: Insufficient documentation

## 2024-04-19 DIAGNOSIS — I10 Essential (primary) hypertension: Secondary | ICD-10-CM | POA: Insufficient documentation

## 2024-04-19 DIAGNOSIS — C187 Malignant neoplasm of sigmoid colon: Secondary | ICD-10-CM | POA: Insufficient documentation

## 2024-04-19 DIAGNOSIS — N83209 Unspecified ovarian cyst, unspecified side: Secondary | ICD-10-CM | POA: Diagnosis not present

## 2024-04-19 DIAGNOSIS — C7951 Secondary malignant neoplasm of bone: Secondary | ICD-10-CM | POA: Insufficient documentation

## 2024-04-19 DIAGNOSIS — R180 Malignant ascites: Secondary | ICD-10-CM | POA: Insufficient documentation

## 2024-04-19 DIAGNOSIS — D259 Leiomyoma of uterus, unspecified: Secondary | ICD-10-CM | POA: Diagnosis not present

## 2024-04-19 DIAGNOSIS — M2550 Pain in unspecified joint: Secondary | ICD-10-CM | POA: Diagnosis not present

## 2024-04-19 DIAGNOSIS — Z800 Family history of malignant neoplasm of digestive organs: Secondary | ICD-10-CM | POA: Insufficient documentation

## 2024-04-19 DIAGNOSIS — I2510 Atherosclerotic heart disease of native coronary artery without angina pectoris: Secondary | ICD-10-CM | POA: Diagnosis not present

## 2024-04-19 DIAGNOSIS — K435 Parastomal hernia without obstruction or  gangrene: Secondary | ICD-10-CM | POA: Insufficient documentation

## 2024-04-19 DIAGNOSIS — K5720 Diverticulitis of large intestine with perforation and abscess without bleeding: Secondary | ICD-10-CM | POA: Insufficient documentation

## 2024-04-19 DIAGNOSIS — R911 Solitary pulmonary nodule: Secondary | ICD-10-CM | POA: Insufficient documentation

## 2024-04-19 DIAGNOSIS — K8020 Calculus of gallbladder without cholecystitis without obstruction: Secondary | ICD-10-CM | POA: Insufficient documentation

## 2024-04-19 DIAGNOSIS — R197 Diarrhea, unspecified: Secondary | ICD-10-CM

## 2024-04-19 DIAGNOSIS — T451X5A Adverse effect of antineoplastic and immunosuppressive drugs, initial encounter: Secondary | ICD-10-CM | POA: Insufficient documentation

## 2024-04-19 DIAGNOSIS — K521 Toxic gastroenteritis and colitis: Secondary | ICD-10-CM | POA: Insufficient documentation

## 2024-04-19 DIAGNOSIS — C7972 Secondary malignant neoplasm of left adrenal gland: Secondary | ICD-10-CM | POA: Diagnosis not present

## 2024-04-19 DIAGNOSIS — J90 Pleural effusion, not elsewhere classified: Secondary | ICD-10-CM | POA: Diagnosis not present

## 2024-04-19 DIAGNOSIS — Z1502 Genetic susceptibility to malignant neoplasm of ovary: Secondary | ICD-10-CM | POA: Insufficient documentation

## 2024-04-19 DIAGNOSIS — Z5112 Encounter for antineoplastic immunotherapy: Secondary | ICD-10-CM | POA: Diagnosis not present

## 2024-04-19 DIAGNOSIS — K760 Fatty (change of) liver, not elsewhere classified: Secondary | ICD-10-CM | POA: Insufficient documentation

## 2024-04-19 DIAGNOSIS — R61 Generalized hyperhidrosis: Secondary | ICD-10-CM | POA: Insufficient documentation

## 2024-04-19 DIAGNOSIS — E860 Dehydration: Secondary | ICD-10-CM | POA: Diagnosis not present

## 2024-04-19 DIAGNOSIS — Z803 Family history of malignant neoplasm of breast: Secondary | ICD-10-CM | POA: Insufficient documentation

## 2024-04-19 DIAGNOSIS — Z7982 Long term (current) use of aspirin: Secondary | ICD-10-CM | POA: Insufficient documentation

## 2024-04-19 DIAGNOSIS — C786 Secondary malignant neoplasm of retroperitoneum and peritoneum: Secondary | ICD-10-CM

## 2024-04-19 DIAGNOSIS — D3502 Benign neoplasm of left adrenal gland: Secondary | ICD-10-CM | POA: Diagnosis not present

## 2024-04-19 DIAGNOSIS — Z933 Colostomy status: Secondary | ICD-10-CM | POA: Insufficient documentation

## 2024-04-19 DIAGNOSIS — Z79899 Other long term (current) drug therapy: Secondary | ICD-10-CM | POA: Insufficient documentation

## 2024-04-19 LAB — C DIFFICILE QUICK SCREEN W PCR REFLEX
C Diff antigen: NEGATIVE
C Diff interpretation: NOT DETECTED
C Diff toxin: NEGATIVE

## 2024-04-19 LAB — CMP (CANCER CENTER ONLY)
ALT: 12 U/L (ref 0–44)
AST: 11 U/L — ABNORMAL LOW (ref 15–41)
Albumin: 4 g/dL (ref 3.5–5.0)
Alkaline Phosphatase: 50 U/L (ref 38–126)
Anion gap: 6 (ref 5–15)
BUN: 17 mg/dL (ref 6–20)
CO2: 24 mmol/L (ref 22–32)
Calcium: 8.8 mg/dL — ABNORMAL LOW (ref 8.9–10.3)
Chloride: 108 mmol/L (ref 98–111)
Creatinine: 0.66 mg/dL (ref 0.44–1.00)
GFR, Estimated: >60 mL/min (ref >60)
Glucose, Bld: 96 mg/dL (ref 70–99)
Potassium: 4.2 mmol/L (ref 3.5–5.1)
Sodium: 138 mmol/L (ref 135–145)
Total Bilirubin: 0.3 mg/dL (ref 0.0–1.2)
Total Protein: 7.3 g/dL (ref 6.5–8.1)

## 2024-04-19 LAB — CBC WITH DIFFERENTIAL (CANCER CENTER ONLY)
Abs Immature Granulocytes: 0.04 K/uL (ref 0.00–0.07)
Basophils Absolute: 0 K/uL (ref 0.0–0.1)
Basophils Relative: 1 %
Eosinophils Absolute: 0.1 K/uL (ref 0.0–0.5)
Eosinophils Relative: 2 %
HCT: 32 % — ABNORMAL LOW (ref 36.0–46.0)
Hemoglobin: 10.2 g/dL — ABNORMAL LOW (ref 12.0–15.0)
Immature Granulocytes: 1 %
Lymphocytes Relative: 25 %
Lymphs Abs: 1.7 K/uL (ref 0.7–4.0)
MCH: 25 pg — ABNORMAL LOW (ref 26.0–34.0)
MCHC: 31.9 g/dL (ref 30.0–36.0)
MCV: 78.4 fL — ABNORMAL LOW (ref 80.0–100.0)
Monocytes Absolute: 0.5 K/uL (ref 0.1–1.0)
Monocytes Relative: 7 %
Neutro Abs: 4.5 K/uL (ref 1.7–7.7)
Neutrophils Relative %: 64 %
Platelet Count: 482 K/uL — ABNORMAL HIGH (ref 150–400)
RBC: 4.08 MIL/uL (ref 3.87–5.11)
RDW: 16.3 % — ABNORMAL HIGH (ref 11.5–15.5)
WBC Count: 6.9 K/uL (ref 4.0–10.5)
nRBC: 0 % (ref 0.0–0.2)

## 2024-04-19 LAB — CEA (ACCESS): CEA (CHCC): <1 ng/mL (ref 0.00–5.00)

## 2024-04-19 NOTE — Assessment & Plan Note (Signed)
-  MMR loss of [name]6, [name] with high mutation burden, acquired BRCA mutation (+)  --Initially presented with diarrhea 11/04/20, found to have diverticulitis with perforation and also developed an abscess requiring 2 drains, multiple hospital stay  --she started FOLFOX on 06/22/21 but did not respond -she switched to Keytruda  on 09/08/21. She has been tolerating well overall but has developed joint pain which could be related, overall manageable. She is clinically doing better also with more energy and less abdominal pain   -her CEA has normalized on immunotherapy. -CT CAP on 03/15/22 again showed mixed response -she had PET scan on 04/26/2022 for evaluation of her ovarian cysts, and saw GYN Dr. Daisey Dryer, we decided to monitor it. -She tolerating Keytruda  well, with mild joint pain.  She recently started workout at the gym, which has helped her joint pain. -restaging CT from 08/04/2022 showed stable disease in left abdomen and pelvis, no new lesions.  I personally reviewed her scan images, and discussed findings with patient.   -Will continue Keytruda   -repeated CT scan on January 24, 2023 showed continuous response, no new lesions.  -PET on 05/04/2023 showed no definitive evidence of progression, some mildly hypermetabolic nodes, will continue monitoring, plan to stop Keytruda  in 09/2023 if no progression on next scan  -PET 08/29/2023 showed similar tiny left pericolic hypermetabolic nodules, most consistent with metastatic disease, no other definitive evidence of residual disease  -ctDNA GuardantReveal was negative on 10/13/2023

## 2024-04-20 LAB — GASTROINTESTINAL PANEL BY PCR, STOOL (REPLACES STOOL CULTURE)

## 2024-04-20 LAB — SIGNATERA
SIGNATERA MTM READOUT: 0 MTM/ml
SIGNATERA TEST RESULT: NEGATIVE

## 2024-04-21 ENCOUNTER — Encounter: Payer: Self-pay | Admitting: Hematology

## 2024-04-21 NOTE — Progress Notes (Signed)
 [facility]   [phone] [phone]   Clinic Follow up Note   Patient Care Team: Jarold Medici, MD as PCP - General (Internal Medicine) [name], MD as PCP - Cardiology (Cardiology) [name], [name] as Physician Assistant (Emergency Medicine) Lanny Callander, MD as Consulting Physician (Oncology) Sheldon Standing, MD as Consulting Physician (General Surgery) [name], Dedra, MD as Consulting Physician (Neurology) [name], Krystal, MD as Consulting Physician (Obstetrics and Gynecology) [name], MD as Consulting Physician (Gastroenterology) [name], [name], NP as Nurse Practitioner (Nurse Practitioner)  Date of Service:  04/19/2024  CHIEF COMPLAINT: f/u of colon cancer   CURRENT THERAPY:  Keytruda  every 3 weeks   Oncology History   Malignant neoplasm of sigmoid colon [facility]) -MMR loss of [name]6, [name] with high mutation burden, acquired BRCA mutation (+)  --Initially presented with diarrhea 11/04/20, found to have diverticulitis with perforation and also developed an abscess requiring 2 drains, multiple hospital stay  --she started FOLFOX on 06/22/21 but did not respond -she switched to Keytruda  on 09/08/21. She has been tolerating well overall but has developed joint pain which could be related, overall manageable. She is clinically doing better also with more energy and less abdominal pain   -her CEA has normalized on immunotherapy. -CT CAP on 03/15/22 again showed mixed response -she had PET scan on 04/26/2022 for evaluation of her ovarian cysts, and saw GYN Dr. [name], we decided to monitor it. -She tolerating Keytruda  well, with mild joint pain.  She recently started workout at the gym, which has helped her joint pain. -restaging CT from 08/04/2022 showed stable disease in left abdomen and pelvis, no new lesions.  I personally reviewed her scan images, and discussed findings with patient.   -Will continue  Keytruda   -repeated CT scan on January 24, 2023 showed continuous response, no new lesions.  -PET on 05/04/2023 showed no definitive evidence of progression, some mildly hypermetabolic nodes, will continue monitoring, plan to stop Keytruda  in 09/2023 if no progression on next scan  -PET 08/29/2023 showed similar tiny left pericolic hypermetabolic nodules, most consistent with metastatic disease, no other definitive evidence of residual disease  -ctDNA GuardantReveal was negative on 10/13/2023  Assessment & Plan Metastatic colon cancer Ongoing treatment. Awaiting PET scan results to determine further treatment plan. - Scheduled PET scan for mid-January after the treatment on January 2nd. - Continue current treatment regimen with next infusion on December 4th, followed by January 2nd. - Will consider stopping treatment if PET scan results are favorable.  Chemotherapy-induced diarrhea Persistent diarrhea likely related to chemotherapy, with watery stools and dehydration. No improvement with dietary modifications. - Recommended over-the-counter Imodium for diarrhea management. - Offered stool sample testing to rule out infectious causes. - Advised dietary modifications to reduce fiber intake.  Chemotherapy-induced nausea and vomiting Nausea and vomiting occurred two days ago, likely related to chemotherapy, but have since resolved.  Sore throat Present for one week without fever.  Headache Intermittent headaches reported.  Dehydration Likely secondary to persistent diarrhea and inadequate fluid intake.  Plan -due to her diarrhea, will check her stool for c-diff and GI panel  -will reschedule her treatment to 11/17 -f/u before next cycle in early Dec    SUMMARY OF ONCOLOGIC HISTORY: Oncology History Overview Note   Cancer Staging  Malignant neoplasm of sigmoid colon [facility]) Staging form: Colon and Rectum, AJCC 8th Edition - Pathologic stage from 04/15/2021: Stage IIC (pT4b, pN0, cM0) -  Signed by Lanny Callander, MD on 06/12/2021  Malignant neoplasm of sigmoid colon [facility])   Initial Diagnosis   Malignant neoplasm of sigmoid colon (HCC)   11/04/2020 Imaging   EXAM: CT ABDOMEN AND PELVIS WITHOUT CONTRAST  IMPRESSION: 1. Perforating descending colonic diverticulitis with multiple abdominopelvic gas and fluid collections, measuring up to 5.7 cm and further described above. 2. Cholelithiasis without findings of acute cholecystitis. 3. 3.6 cm benign left adrenal adenoma. 4. Leiomyomatous uterus. 5. 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   11/07/2020 Imaging   EXAM: CT ABDOMEN AND PELVIS WITHOUT CONTRAST  IMPRESSION: Continued wall thickening is seen involving descending colon suggesting infectious or inflammatory colitis or perforated diverticulitis. There is an adjacent fluid collection measuring 7.0 x 5.0 cm consistent with abscess which is significantly enlarged compared to prior exam. Adjacent to the abscess, there is a severely thickened small bowel loop most consistent with secondary inflammation.   5.2 x 3.5 cm fluid collection is noted within the left psoas muscle consistent with abscess which is significantly enlarged compared to prior exam. 4.4 x 3.8 cm fluid collection consistent with abscess is noted in the left retroperitoneal region which is also enlarged compared to prior exam.   Fibroid uterus.   Cholelithiasis.   11/27/2020 Imaging   EXAM: CT ABDOMEN AND PELVIS WITHOUT CONTRAST  IMPRESSION: 1. Significantly interval decreased size of the previously visualized left retroperitoneal and left anterior mid abdominal fluid collections. There is persistent fat stranding and mural thickening of the mid descending colon at this level. 2. Unchanged leiomyomatous uterus. 3. Unchanged cholelithiasis.   12/11/2020 Imaging   EXAM: CT ABDOMEN AND PELVIS WITHOUT  CONTRAST  IMPRESSION: 1. Stable inflammatory changes centered around the distal descending colon in the left lower abdomen. Pericolonic inflammatory changes have minimally changed since 11/27/2020. Small pocket of gas medial to the colon is probably associated with a fistula or small residual abscess collection. 2. Stable position of the two percutaneous drains. The more anterior drain may be extending through a portion of the small bowel. 3. Cholelithiasis. 4. Fibroid uterus. Cannot exclude an ovarian/adnexal cystic structure near the uterine fundus. 5. Left adrenal adenoma.   01/07/2021 Imaging   EXAM: CT ABDOMEN AND PELVIS WITH CONTRAST  IMPRESSION: 1. No new abscesses identified. Similar degree of soft tissue thickening seen in the left pericolonic region. The degree of persistent soft tissues thickening in the pericolonic space further raises suspicions for malignancy. Further evaluation with colonoscopy should be performed. 2. Left retroperitoneal abscess drain unchanged in position. Anterior left abdominal drain again seen terminating within small bowel loop. 3. 2.6 cm mildly sclerotic lesion noted in the L2 vertebral body. Further evaluation with contrast enhanced lumbar spine MRI should be performed.   02/04/2021 Imaging   EXAM: CT ABDOMEN AND PELVIS WITH CONTRAST  IMPRESSION: No new abdominopelvic collections or abscess development in the 1 month interval.   Left anterior drain remains within a loop of small bowel, unchanged.   Left lateral abscess drain remains in the retroperitoneal space adjacent to the iliopsoas muscle with a small amount of residual fluid and air but no measurable collection.   Stable soft tissue prominence and pericolonic strandy edema/inflammation about the left descending colon compatible with residual diverticulitis/colitis. Difficult to exclude underlying transmural lesion.   04/01/2021 Imaging   EXAM: CT ABDOMEN AND PELVIS WITH  CONTRAST  IMPRESSION: There appears to be significant enhancement and wall thickening involving the descending colon with some degree of traction  and involvement of adjacent small bowel loops. This is consistent with the history of diverticulitis and perforation. Stable position of percutaneous drainage catheter is seen adjacent to left psoas muscle with no significant residual fluid remaining. The other percutaneous drainage catheter that was previously noted to be within small bowel loop on prior exam in the left lower quadrant, has significantly retracted and appears to be outside of the peritoneal space at this time.   Since the prior exam, there does appear to be some degree of rotation involving mesenteric vessels and structures in the right lower quadrant, suggesting partial volvulus or malrotation. There is the interval development of several lymph nodes in this area, most likely inflammatory or reactive in etiology. Mild amount of free fluid is also noted in the pelvis. However, no significant bowel wall thickening or dilatation is seen in this area. These results will be called to the ordering clinician or representative by the Radiologist Assistant, and communication documented in the PACS or zVision Dashboard.   Stable uterine fibroid.   Hepatic steatosis.   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   Cholelithiasis.   04/10/2021 Imaging   EXAM: CT ANGIOGRAPHY CHEST CT ABDOMEN AND PELVIS WITH CONTRAST  IMPRESSION: 1. Again seen are findings compatible with descending colon diverticulitis with perforation. Free air and inflammation have increased in the interval. 2. New lobulated enhancing fluid collection posterior to this segment of inflamed colon measuring 8.5 x 3.5 x 10.0 cm. This collection now invades the adjacent iliopsoas muscle as well as extends through the left lateral abdominal wall. The tip of the drainage catheter is in this collection. Findings are  compatible with abscess. 3. Anterior left percutaneous drainage catheter tip has been pulled back and is now within the subcutaneous tissues. 4. Trace free fluid. 5. No pulmonary embolism.  No acute cardiopulmonary process. 6. Cholelithiasis. 7. Fatty infiltration of the liver.   04/15/2021 Definitive Surgery   FINAL MICROSCOPIC DIAGNOSIS:   A. SMALL BOWEL, RESECTION:  - Adenocarcinoma.  - No carcinoma identified in 1 lymph node.   B. PERFORATED LEFT COLON, RESECTION:  - Moderately differentiated colonic adenocarcinoma.  - Tumor extends into pericolonic adipose tissue, and is strongly  suggestive of invasion into small bowel.  See oncology table/comments.  - No carcinoma identified in 4 lymph nodes (0/4).  - Tubular adenoma with high-grade dysplasia, 1.  - Tubular adenomas with low grade dysplasia, 3.   Comments: The size of the tumor is difficult to estimate secondary to the disrupted nature of the specimen, as well as the infiltrative nature of the tumor.  Tumor can be identified as definitely invading into the pericolonic adipose tissue (block B4), but given the extreme disruption of the tissue, and the involvement of the small bowel by what appears to be a colonic adenocarcinoma, I favor perforation of the large bowel with direct invasion into the small bowel.  Accordingly, I believe this is best regarded as a pT4b lesion.   ADDENDUM:  Mismatch Repair Protein (IHC)  SUMMARY INTERPRETATION: ABNORMAL  There is loss of the major MMR protein [name]6: This indicates a high probability that a hereditary germline mutation is present and referral to genetic counseling is warranted. It is recommended that the loss of protein expression be correlated with molecular based microsatellite instability testing.   IHC EXPRESSION RESULTS  TEST           RESULT  MLH1:          Preserved nuclear expression  [name]2:  Preserved nuclear expression  [name]6:          LOSS OF NUCLEAR EXPRESSION  PMS2:           Preserved nuclear expression    04/15/2021 Cancer Staging   Staging form: Colon and Rectum, AJCC 8th Edition - Pathologic stage from 04/15/2021: Stage IIC (pT4b, pN0, cM0) - Signed by Lanny Callander, MD on 06/12/2021 Stage prefix: Initial diagnosis Total positive nodes: 0 Histologic grading system: 4 grade system Histologic grade (G): G2 Residual tumor (R): R0 - None   04/29/2021 Imaging   EXAM: CT ABDOMEN AND PELVIS WITH CONTRAST  IMPRESSION: Continued left retroperitoneal abscesses inferior to the left kidney, involving the left psoas muscle and left abdominal wall musculature. Overall size is decreased since prior study. Interval removal of left lower quadrant abscess drainage catheter.   New fluid collection in the cul-de-sac and wrapping around the uterus concerning for abscess.   Cholelithiasis.   Small left pleural effusion.  Bibasilar atelectasis.   Stable left adrenal adenoma.  ADDENDUM: After discussing the case with Dr. Katha in interventional radiology, it was noted that the fluid in the pelvis originally thought to be in the cul-de-sac is likely within the vagina, best seen on sagittal imaging. Recommend speculum exam.   Also, in the left lateral wall in the area of prior abscess in the lateral wall abdominal musculature, some of the abdomen soft tissue appears to be enhancing. While this could be infectious, cannot exclude tumor seeding in the left lateral abdominal wall, measuring 6.9 x 3.8 cm on image 64 of series 2.   06/16/2021 Imaging   EXAM: CT ABDOMEN AND PELVIS WITH CONTRAST  IMPRESSION: Increased size of bulky soft tissue masses involving the left psoas muscle and left lateral abdominal wall soft tissues, consistent with progressive metastatic disease.   Significant progression of multiple peritoneal masses throughout the abdomen and pelvis, consistent with peritoneal carcinomatosis.   New mild retroperitoneal lymphadenopathy, consistent with  metastatic disease.   Stable uterine fibroid and left adrenal adenoma.   Cholelithiasis, without evidence of cholecystitis.   06/22/2021 - 08/19/2021 Chemotherapy   Patient is on Treatment Plan : COLORECTAL FOLFOX q14d x 6 months     06/22/2021 Tumor Marker   Patient's tumor was tested for the following markers: CEA. Results of the tumor marker test revealed 87.41.   07/10/2021 Pathology Results   FINAL MICROSCOPIC DIAGNOSIS:   A. PERITONEAL MASS, LEFT UPPER ABDOMINAL QUADRANT, BIOPSY:  -  Metastatic adenocarcinoma with necrosis, histologically consistent with colon primary.     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 The report date is 07/16/2021. The remainder of testing was negative/normal.  The CancerNext-Expanded + RNAinsight gene panel offered by [employer] and includes sequencing and rearrangement analysis for the following 77 genes: IP, ALK, APC*, ATM*, AXIN2, BAP1, BARD1, BLM, BMPR1A, BRCA1*, BRCA2*, BRIP1*, CDC73, CDH1*,CDK4, CDKN1B, CDKN2A, CHEK2*, CTNNA1, DICER1, FANCC, FH, FLCN, GALNT12, KIF1B, LZTR1, MAX, MEN1, MET, MLH1*, [name]2*, [name]3, [name]6*, MUTYH*, NBN, NF1*, NF2, NTHL1, PALB2*, PHOX2B, PMS2*, POT1, PRKAR1A, PTCH1, PTEN*, RAD51C*, RAD51D*,RB1, RECQL, RET, SDHA, SDHAF2, SDHB, SDHC, SDHD, SMAD4, SMARCA4, SMARCB1, SMARCE1, STK11, SUFU, TMEM127, TP53*,TSC1, TSC2, VHL and XRCC2 (sequencing and deletion/duplication); EGFR, EGLN1, HOXB13, KIT, MITF, PDGFRA, POLD1 and POLE (sequencing only); EPCAM and GREM1 (deletion/duplication only).   08/28/2021 Imaging   EXAM: CT CHEST, ABDOMEN, AND PELVIS WITH CONTRAST  IMPRESSION: 1. Continued interval progression of multiple metastatic soft tissue nodules/masses in the abdomen and  pelvis. 2. No evidence for metastatic disease in the chest. 3. Similar appearance of the subtle lesion in the L2 vertebral body, suspicious for metastatic involvement. 4.  Cholelithiasis.   09/03/2021 -  Chemotherapy   Patient is on Treatment Plan : COLORECTAL Pembrolizumab  (200) q21d     09/08/2021 - 02/01/2022 Chemotherapy   Patient is on Treatment Plan : COLORECTAL Pembrolizumab  (200) q21d     01/08/2022 Imaging   CT CAP IMPRESSION: 1. Mixed response to therapy. Interval decrease in size of the conglomerate multilobulated mass extending from the left psoas into the left posterior pararenal space through the left lateral abdominal wall along the tract of the previously placed percutaneous drainage catheter. Interval decrease in size of right adnexal mass and omental masses within the right lower quadrant of the abdomen. Interval development of peritoneal carcinomatosis with omental caking within the left lower quadrant of the abdomen and left subdiaphragmatic region adjacent to the spleen and along the left pericolic gutter as well as development of ascites appearing loculated within the anterior peritoneum. 2. Interval development of mild left hydronephrosis secondary to stricturing of the mid left ureter. 3. Stable left adrenal metastasis. 4. Stable sclerotic lesion within the L2 vertebral body. No new lytic or blastic bone lesions are seen. 5. Cholelithiasis. 6. No evidence of intrathoracic metastatic disease. 7. 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 Moderate colonic stool burden. No evidence of obstruction.   03/15/2022 Imaging   IMPRESSION: 1. Status post sigmoid colon resection with left lower quadrant end colostomy. 2. Multicystic bilateral ovarian lesions are slightly increased in size. 3. Diminished, small volume of loculated appearing ascites throughout the abdomen and pelvis, with similar appearance of peritoneal thickening and nodularity throughout. 4. Heterogeneously calcified, conglomerate masses in the left retroperitoneum and abdominal wall are slightly diminished in  size. 5. Left adrenal lesion not significantly changed. 6. Unchanged faintly sclerotic metastatic lesion of L2. 7. Constellation of findings is consistent with mixed response to treatment however with overall little interval change. 8. Minimal residual left hydronephrosis and proximal hydroureter, improved compared to prior examination. 9. Cholelithiasis. 10. Coronary artery disease, significantly advanced for patient age.   08/04/2022 Imaging    IMPRESSION: CT CHEST:   1. No developing mass lesion, fluid collection or lymph node enlargement in the thorax. Stable tiny 3 mm lung nodule. 2. Coronary artery calcifications. Please correlate for other coronary risk factors. 3. Chest port   CT ABDOMEN AND PELVIS:   1. Extensive surgical changes identified. Left lower quadrant ostomy, diverting colostomy. Surgical changes along loops of small bowel in the right hemiabdomen. 2. Once again there are areas of nodular tissue extending along the left lateral abdominal wall, into the retroperitoneum which are similar to previous. The amount of adjacent fluid in these locations has improved. 3. No new mass lesion, fluid collection or lymph node enlargement in the abdomen or pelvis. 4. Gallstones. 5. Enlarged uterus with fibroids. Bilateral complex cystic areas are once again seen and based on appearance differential includes dilated fallopian tubes, hydrosalpinx versus true cystic lesions. Please correlate for any known history or prior workup 6. Limited evaluation for solid organ pathology metastatic disease without the advantage of IV contrast     05/04/2023 PET scan   PET - restaging - skull base to thigh  IMPRESSION: 1. Subcentimeter peritoneal nodules in the left abdomen have decreased in size and hypermetabolism from 04/26/2022, compatible with treated metastatic disease. 2. Chronic focal hypermetabolism  associated with a gallbladder nodule. Malignancy cannot be excluded. 3. Small  hypermetabolic to borderline hypermetabolic external iliac and left inguinal lymph nodes and minimally hypermetabolic small bilateral cervical lymph nodes, nonspecific. Metastatic disease not excluded. Recommend attention on follow-up. 4. Left adrenal adenoma. 5. Cystic right adnexal mass is inadequately characterized. Because this lesion is not adequately characterized, prompt US  is recommended for further evaluation. Note: This recommendation does not apply to premenarchal patients and to those with increased risk (genetic, family history, elevated tumor markers or other high-risk factors) of ovarian cancer. Reference: JACR 2020 Feb; 17(2):248-254 6. 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 No developing mass lesion, fluid collection or lymph node enlargement in the thorax. Stable tiny 3 mm lung nodule. 2. Coronary artery calcifications. Please correlate for other coronary risk factors. 3. Chest port   CT ABDOMEN AND PELVIS:   1. Extensive surgical changes identified. Left lower quadrant ostomy, diverting colostomy. Surgical changes along loops of small bowel in the right hemiabdomen. 2. Once again there are areas of nodular tissue extending along the left lateral abdominal wall, into the retroperitoneum which are similar to previous. The amount of adjacent fluid in these locations has improved. 3. No new mass lesion, fluid collection or lymph node enlargement in the abdomen or pelvis. 4. Gallstones. 5. Enlarged uterus with fibroids. Bilateral complex cystic areas are once again seen and based on appearance differential includes dilated fallopian tubes, hydrosalpinx versus true cystic  lesions. Please correlate for any known history or prior workup 6. Limited evaluation for solid organ pathology metastatic disease without the advantage of IV contrast        Discussed the use of AI scribe software for clinical note transcription with the patient, who gave verbal consent to proceed.  History of Present Illness Mikayla Rios is a 38 year old female with metastatic colon cancer who presents for follow-up.  She experiences nausea and vomiting associated with her chemotherapy pump, lasting for about two days. Vomiting occurs on the first day, followed by nausea on the second day, and then resolves.  Persistent watery diarrhea has been present for the past couple of weeks. Dietary changes have not provided relief, and she has not tried over-the-counter medications like Imodium.  She has a sore throat for about a week, accompanied by constant headaches. No fever is present, but she experiences night sweats and chills, which are not new symptoms.  She feels dehydrated, as she 'can never quench her thirst.' She feels okay overall and does not feel tired.     All other systems were reviewed with the patient and are negative.  MEDICAL HISTORY:  Past Medical History:  Diagnosis Date   Allergy    seasonal   Anemia    Blood infection 1985   Blood transfusion without reported diagnosis    colon ca 04/2021   Diverticulitis    Family history of breast cancer    Family history of colon cancer    Family history of colon cancer    Family history of stomach cancer    Hypertension    Obesity    Sleep apnea     SURGICAL HISTORY: Past Surgical History:  Procedure Laterality Date   COLECTOMY WITH COLOSTOMY CREATION/HARTMANN PROCEDURE N/A 04/15/2021   Procedure: COLOSTOMY CREATION/HARTMANN PROCEDURE;  Surgeon: Sheldon Standing, MD;  Location: [facility];  Service: General;  Laterality: N/A;   IR CATHETER TUBE CHANGE  12/12/2020   IR CATHETER TUBE CHANGE  01/27/2021   IR CATHETER  TUBE CHANGE  02/19/2021   IR CATHETER TUBE CHANGE  04/13/2021   IR IMAGING GUIDED PORT INSERTION  10/02/2021   IR RADIOLOGIST EVAL & MGMT  11/27/2020   IR RADIOLOGIST EVAL & MGMT  12/11/2020   IR RADIOLOGIST EVAL & MGMT  01/07/2021   IR RADIOLOGIST EVAL & MGMT  02/04/2021   IR SINUS/FIST TUBE CHK-NON GI  12/12/2020   IR SINUS/FIST TUBE CHK-NON GI  02/19/2021   LAPAROSCOPIC PARTIAL COLECTOMY N/A 04/15/2021   Procedure: LAPAROSCOPIC ASSISTED HARTMANN RESECTION;  Surgeon: Sheldon Standing, MD;  Location: [facility];  Service: General;  Laterality: N/A;    I have reviewed the social history and family history with the patient and they are unchanged from previous note.  ALLERGIES:  is allergic to chlorhexidine  gluconate, shellfish allergy, and penicillins.  MEDICATIONS:  Current Outpatient Medications  Medication Sig Dispense Refill   acetaminophen -codeine  (TYLENOL  #3) 300-30 MG tablet Take 1 tablet by mouth every 8 (eight) hours as needed for moderate pain (pain score 4-6). 30 tablet 0   ALPRAZolam  (XANAX ) 0.25 MG tablet Take 1 tablet (0.25 mg total) by mouth at bedtime as needed for anxiety. 30 tablet 0   amLODipine  (NORVASC ) 10 MG tablet TAKE 1 TABLET BY MOUTH EVERY DAY 90 tablet 3   aspirin  EC 81 MG tablet Take 1 tablet (81 mg total) by mouth daily. Swallow whole.     atorvastatin  (LIPITOR) 40 MG tablet TAKE 1 TABLET BY MOUTH EVERY DAY 90 tablet 2   carvedilol  (COREG ) 12.5 MG tablet TAKE 1.5 TABLETS BY MOUTH 2 (TWO) TIMES DAILY. 270 tablet 2   diclofenac  Sodium (VOLTAREN ) 1 % GEL Apply 2 g topically 4 (four) times daily. 100 g 2   dicyclomine  (BENTYL ) 10 MG capsule Take 1 capsule (10 mg total) by mouth every 8 (eight) hours as needed for spasms. 20 capsule 0   furosemide  (LASIX ) 20 MG tablet TAKE 1 TABLET BY MOUTH EVERY DAY AS NEEDED 90 tablet 1   gabapentin  (NEURONTIN ) 300 MG capsule Take 1 capsule (300 mg total) by mouth at bedtime. 90 capsule 1   KLOR-CON  M20 20 MEQ tablet TAKE 1 TABLET BY  MOUTH EVERY DAY 90 tablet 1   lidocaine -prilocaine  (EMLA ) cream Apply 1 Application topically as needed. 30 g 3   methocarbamol  (ROBAXIN -750) 750 MG tablet Take 1 tablet (750 mg total) by mouth every 8 (eight) hours as needed for muscle spasms. 60 tablet 0   prochlorperazine  (COMPAZINE ) 10 MG tablet Take 1 tablet (10 mg total) by mouth every 8 (eight) hours as needed for nausea or vomiting. 30 tablet 0   traZODone  (DESYREL ) 50 MG tablet TAKE 1-2 TABLETS BY MOUTH AT BEDTIME AS NEEDED FOR SLEEP. 180 tablet 1   triamcinolone  cream (KENALOG ) 0.1 % APPLY TO AFFECTED AREA TWICE DAILY AS NEEDED 30 g 0   valsartan  (DIOVAN ) 320 MG tablet Take 1 tablet (320 mg total) by mouth daily. 30 tablet 3   No current facility-administered medications for this visit.   Facility-Administered Medications Ordered in Other Visits  Medication Dose Route Frequency Provider Last Rate Last Admin   fentaNYL  (SUBLIMAZE ) injection   Intravenous PRN Mir, Farhaan R, MD   50  mcg at 10/02/21 1436   midazolam  (VERSED ) injection   Intravenous PRN Mir, Farhaan R, MD   1 mg at 10/02/21 1436    PHYSICAL EXAMINATION: ECOG PERFORMANCE STATUS: 1 - Symptomatic but completely ambulatory  Vitals:   04/19/24 1320  BP: 124/76  Pulse: 92  Resp: 16  Temp: 98.3 F (36.8 C)  SpO2: 98%   Wt Readings from Last 3 Encounters:  04/19/24 (!) 362 lb 3.2 oz (164.3 kg)  03/29/24 (!) 361 lb 12.8 oz (164.1 kg)  03/08/24 (!) 359 lb 8 oz (163.1 kg)     GENERAL:alert, no distress and comfortable SKIN: skin color, texture, turgor are normal, no rashes or significant lesions EYES: normal, Conjunctiva are pink and non-injected, sclera clear NECK: supple, thyroid  normal size, non-tender, without nodularity LYMPH:  no palpable lymphadenopathy in the cervical, axillary  LUNGS: clear to auscultation and percussion with normal breathing effort HEART: regular rate & rhythm and no murmurs and no lower extremity edema ABDOMEN:abdomen soft, non-tender  and normal bowel sounds Musculoskeletal:no cyanosis of digits and no clubbing  NEURO: alert & oriented x 3 with fluent speech, no focal motor/sensory deficits  Physical Exam    LABORATORY DATA:  I have reviewed the data as listed    Latest Ref Rng & Units 04/19/2024   12:46 PM 03/29/2024   12:43 PM 03/08/2024    1:16 PM  CBC  WBC 4.0 - 10.5 K/uL 6.9  7.7  6.6   Hemoglobin 12.0 - 15.0 g/dL 89.7  89.6  89.6   Hematocrit 36.0 - 46.0 % 32.0  32.0  32.1   Platelets 150 - 400 K/uL 482  492  481         Latest Ref Rng & Units 04/19/2024   12:46 PM 03/29/2024   12:43 PM 03/08/2024    1:16 PM  CMP  Glucose 70 - 99 mg/dL 96  98  96   BUN 6 - 20 mg/dL 17  19  19    Creatinine 0.44 - 1.00 mg/dL 9.33  9.09  9.10   Sodium 135 - 145 mmol/L 138  139  138   Potassium 3.5 - 5.1 mmol/L 4.2  4.0  4.0   Chloride 98 - 111 mmol/L 108  107  109   CO2 22 - 32 mmol/L 24  24  23    Calcium  8.9 - 10.3 mg/dL 8.8  8.7  9.2   Total Protein 6.5 - 8.1 g/dL 7.3  7.2  7.7   Total Bilirubin 0.0 - 1.2 mg/dL 0.3  0.3  0.3   Alkaline Phos 38 - 126 U/L 50  49  44   AST 15 - 41 U/L 11  12  11    ALT 0 - 44 U/L 12  12  13        RADIOGRAPHIC STUDIES: I have personally reviewed the radiological images as listed and agreed with the findings in the report. No results found.    Orders Placed This Encounter  Procedures   Gastrointestinal Panel by PCR , Stool    Standing Status:   Future    Number of Occurrences:   1    Expected Date:   04/19/2024    Expiration Date:   04/19/2025   C difficile quick screen w PCR reflex    Standing Status:   Future    Number of Occurrences:   1    Expected Date:   04/19/2024    Expiration Date:   04/19/2025   CBC with Differential (  Cancer Center Only)    Standing Status:   Future    Expected Date:   05/14/2024    Expiration Date:   05/14/2025   CMP (Cancer Center only)    Standing Status:   Future    Expected Date:   05/14/2024    Expiration Date:   05/14/2025   CBC with  Differential (Cancer Center Only)    Standing Status:   Future    Expected Date:   06/12/2024    Expiration Date:   06/12/2025   CMP (Cancer Center only)    Standing Status:   Future    Expected Date:   06/12/2024    Expiration Date:   06/12/2025   CBC with Differential (Cancer Center Only)    Standing Status:   Future    Expected Date:   07/03/2024    Expiration Date:   07/03/2025   CMP (Cancer Center only)    Standing Status:   Future    Expected Date:   07/03/2024    Expiration Date:   07/03/2025   All questions were answered. The patient knows to call the clinic with any problems, questions or concerns. No barriers to learning was detected. The total time spent in the appointment was 25 minutes, including review of chart and various tests results, discussions about plan of care and coordination of care plan     [name], MD 04/19/2024

## 2024-04-23 ENCOUNTER — Inpatient Hospital Stay

## 2024-04-23 ENCOUNTER — Encounter (HOSPITAL_COMMUNITY): Payer: Self-pay | Admitting: Hematology

## 2024-04-23 VITALS — BP 132/82 | HR 86 | Temp 99.0°F | Resp 17 | Ht 64.0 in | Wt 375.0 lb

## 2024-04-23 DIAGNOSIS — Z5112 Encounter for antineoplastic immunotherapy: Secondary | ICD-10-CM | POA: Diagnosis not present

## 2024-04-23 DIAGNOSIS — R180 Malignant ascites: Secondary | ICD-10-CM | POA: Diagnosis not present

## 2024-04-23 DIAGNOSIS — R911 Solitary pulmonary nodule: Secondary | ICD-10-CM | POA: Diagnosis not present

## 2024-04-23 DIAGNOSIS — D3502 Benign neoplasm of left adrenal gland: Secondary | ICD-10-CM | POA: Diagnosis not present

## 2024-04-23 DIAGNOSIS — M2550 Pain in unspecified joint: Secondary | ICD-10-CM | POA: Diagnosis not present

## 2024-04-23 DIAGNOSIS — J029 Acute pharyngitis, unspecified: Secondary | ICD-10-CM | POA: Diagnosis not present

## 2024-04-23 DIAGNOSIS — T451X5A Adverse effect of antineoplastic and immunosuppressive drugs, initial encounter: Secondary | ICD-10-CM | POA: Diagnosis not present

## 2024-04-23 DIAGNOSIS — R112 Nausea with vomiting, unspecified: Secondary | ICD-10-CM | POA: Diagnosis not present

## 2024-04-23 DIAGNOSIS — C187 Malignant neoplasm of sigmoid colon: Secondary | ICD-10-CM

## 2024-04-23 DIAGNOSIS — C786 Secondary malignant neoplasm of retroperitoneum and peritoneum: Secondary | ICD-10-CM | POA: Diagnosis not present

## 2024-04-23 DIAGNOSIS — J90 Pleural effusion, not elsewhere classified: Secondary | ICD-10-CM | POA: Diagnosis not present

## 2024-04-23 DIAGNOSIS — C7972 Secondary malignant neoplasm of left adrenal gland: Secondary | ICD-10-CM | POA: Diagnosis not present

## 2024-04-23 DIAGNOSIS — E860 Dehydration: Secondary | ICD-10-CM | POA: Diagnosis not present

## 2024-04-23 DIAGNOSIS — I2510 Atherosclerotic heart disease of native coronary artery without angina pectoris: Secondary | ICD-10-CM | POA: Diagnosis not present

## 2024-04-23 DIAGNOSIS — D259 Leiomyoma of uterus, unspecified: Secondary | ICD-10-CM | POA: Diagnosis not present

## 2024-04-23 DIAGNOSIS — K760 Fatty (change of) liver, not elsewhere classified: Secondary | ICD-10-CM | POA: Diagnosis not present

## 2024-04-23 DIAGNOSIS — K8020 Calculus of gallbladder without cholecystitis without obstruction: Secondary | ICD-10-CM | POA: Diagnosis not present

## 2024-04-23 DIAGNOSIS — N83209 Unspecified ovarian cyst, unspecified side: Secondary | ICD-10-CM | POA: Diagnosis not present

## 2024-04-23 DIAGNOSIS — R197 Diarrhea, unspecified: Secondary | ICD-10-CM | POA: Diagnosis not present

## 2024-04-23 DIAGNOSIS — C7951 Secondary malignant neoplasm of bone: Secondary | ICD-10-CM | POA: Diagnosis not present

## 2024-04-23 DIAGNOSIS — K435 Parastomal hernia without obstruction or  gangrene: Secondary | ICD-10-CM | POA: Diagnosis not present

## 2024-04-23 DIAGNOSIS — K521 Toxic gastroenteritis and colitis: Secondary | ICD-10-CM | POA: Diagnosis not present

## 2024-04-23 DIAGNOSIS — R61 Generalized hyperhidrosis: Secondary | ICD-10-CM | POA: Diagnosis not present

## 2024-04-23 DIAGNOSIS — K5720 Diverticulitis of large intestine with perforation and abscess without bleeding: Secondary | ICD-10-CM | POA: Diagnosis not present

## 2024-04-23 MED ORDER — SODIUM CHLORIDE 0.9 % IV SOLN
200.0000 mg | Freq: Once | INTRAVENOUS | Status: AC
  Administered 2024-04-23: 200 mg via INTRAVENOUS
  Filled 2024-04-23: qty 200

## 2024-04-23 MED ORDER — SODIUM CHLORIDE 0.9 % IV SOLN: Freq: Once | INTRAVENOUS | Status: AC

## 2024-04-23 NOTE — Patient Instructions (Signed)

## 2024-04-25 DIAGNOSIS — Z933 Colostomy status: Secondary | ICD-10-CM | POA: Diagnosis not present

## 2024-05-06 ENCOUNTER — Other Ambulatory Visit (HOSPITAL_COMMUNITY): Payer: Self-pay

## 2024-05-09 ENCOUNTER — Other Ambulatory Visit: Payer: Self-pay

## 2024-05-09 NOTE — Progress Notes (Signed)
 As per Dr.Feng contacted pt to rely results for Signatera. Pt results were negative. Pt voiced a full understanding and no questions at this time

## 2024-05-13 NOTE — Assessment & Plan Note (Signed)
-  MMR loss of [name]6, [name] with high mutation burden, acquired BRCA mutation (+)  --Initially presented with diarrhea 11/04/20, found to have diverticulitis with perforation and also developed an abscess requiring 2 drains, multiple hospital stay  --she started FOLFOX on 06/22/21 but did not respond -she switched to Keytruda  on 09/08/21. She has been tolerating well overall but has developed joint pain which could be related, overall manageable. She is clinically doing better also with more energy and less abdominal pain   -her CEA has normalized on immunotherapy. -CT CAP on 03/15/22 again showed mixed response -she had PET scan on 04/26/2022 for evaluation of her ovarian cysts, and saw GYN Dr. [name], we decided to monitor it. -She tolerating Keytruda  well, with mild joint pain.  She recently started workout at the gym, which has helped her joint pain. -restaging CT from 08/04/2022 showed stable disease in left abdomen and pelvis, no new lesions.  I personally reviewed her scan images, and discussed findings with patient.   -Will continue Keytruda   -repeated CT scan on January 24, 2023 showed continuous response, no new lesions.  -PET on 05/04/2023 showed no definitive evidence of progression, some mildly hypermetabolic nodes, will continue monitoring, plan to stop Keytruda  in 09/2023 if no progression on next scan  -PET 08/29/2023 showed similar tiny left pericolic hypermetabolic nodules, most consistent with metastatic disease, no other definitive evidence of residual disease  -ctDNA GuardantReveal was negative on 10/13/2023, Signatera was also negative on 04/23/2024  -

## 2024-05-14 ENCOUNTER — Inpatient Hospital Stay: Attending: Hematology

## 2024-05-14 ENCOUNTER — Encounter: Payer: Self-pay | Admitting: Hematology

## 2024-05-14 ENCOUNTER — Inpatient Hospital Stay

## 2024-05-14 ENCOUNTER — Inpatient Hospital Stay: Admitting: Hematology

## 2024-05-14 VITALS — BP 128/80 | HR 97 | Temp 97.3°F | Resp 17 | Wt 365.3 lb

## 2024-05-14 VITALS — BP 145/90 | HR 88 | Resp 16

## 2024-05-14 DIAGNOSIS — J9811 Atelectasis: Secondary | ICD-10-CM | POA: Diagnosis not present

## 2024-05-14 DIAGNOSIS — D500 Iron deficiency anemia secondary to blood loss (chronic): Secondary | ICD-10-CM

## 2024-05-14 DIAGNOSIS — J90 Pleural effusion, not elsewhere classified: Secondary | ICD-10-CM | POA: Diagnosis not present

## 2024-05-14 DIAGNOSIS — Z791 Long term (current) use of non-steroidal anti-inflammatories (NSAID): Secondary | ICD-10-CM | POA: Insufficient documentation

## 2024-05-14 DIAGNOSIS — D509 Iron deficiency anemia, unspecified: Secondary | ICD-10-CM | POA: Insufficient documentation

## 2024-05-14 DIAGNOSIS — K5720 Diverticulitis of large intestine with perforation and abscess without bleeding: Secondary | ICD-10-CM | POA: Diagnosis not present

## 2024-05-14 DIAGNOSIS — C786 Secondary malignant neoplasm of retroperitoneum and peritoneum: Secondary | ICD-10-CM

## 2024-05-14 DIAGNOSIS — Z1509 Genetic susceptibility to other malignant neoplasm: Secondary | ICD-10-CM | POA: Diagnosis not present

## 2024-05-14 DIAGNOSIS — Z1502 Genetic susceptibility to malignant neoplasm of ovary: Secondary | ICD-10-CM | POA: Diagnosis not present

## 2024-05-14 DIAGNOSIS — Z79899 Other long term (current) drug therapy: Secondary | ICD-10-CM | POA: Diagnosis not present

## 2024-05-14 DIAGNOSIS — D3502 Benign neoplasm of left adrenal gland: Secondary | ICD-10-CM | POA: Insufficient documentation

## 2024-05-14 DIAGNOSIS — Z5112 Encounter for antineoplastic immunotherapy: Secondary | ICD-10-CM | POA: Insufficient documentation

## 2024-05-14 DIAGNOSIS — K760 Fatty (change of) liver, not elsewhere classified: Secondary | ICD-10-CM | POA: Diagnosis not present

## 2024-05-14 DIAGNOSIS — H538 Other visual disturbances: Secondary | ICD-10-CM | POA: Diagnosis not present

## 2024-05-14 DIAGNOSIS — Z1501 Genetic susceptibility to malignant neoplasm of breast: Secondary | ICD-10-CM | POA: Insufficient documentation

## 2024-05-14 DIAGNOSIS — N83209 Unspecified ovarian cyst, unspecified side: Secondary | ICD-10-CM | POA: Diagnosis not present

## 2024-05-14 DIAGNOSIS — Z933 Colostomy status: Secondary | ICD-10-CM | POA: Diagnosis not present

## 2024-05-14 DIAGNOSIS — M2550 Pain in unspecified joint: Secondary | ICD-10-CM | POA: Insufficient documentation

## 2024-05-14 DIAGNOSIS — R911 Solitary pulmonary nodule: Secondary | ICD-10-CM | POA: Insufficient documentation

## 2024-05-14 DIAGNOSIS — I2510 Atherosclerotic heart disease of native coronary artery without angina pectoris: Secondary | ICD-10-CM | POA: Diagnosis not present

## 2024-05-14 DIAGNOSIS — C187 Malignant neoplasm of sigmoid colon: Secondary | ICD-10-CM | POA: Insufficient documentation

## 2024-05-14 DIAGNOSIS — C787 Secondary malignant neoplasm of liver and intrahepatic bile duct: Secondary | ICD-10-CM | POA: Insufficient documentation

## 2024-05-14 DIAGNOSIS — C7951 Secondary malignant neoplasm of bone: Secondary | ICD-10-CM | POA: Diagnosis not present

## 2024-05-14 DIAGNOSIS — Z7982 Long term (current) use of aspirin: Secondary | ICD-10-CM | POA: Insufficient documentation

## 2024-05-14 DIAGNOSIS — K8020 Calculus of gallbladder without cholecystitis without obstruction: Secondary | ICD-10-CM | POA: Insufficient documentation

## 2024-05-14 LAB — CBC WITH DIFFERENTIAL (CANCER CENTER ONLY)
Abs Immature Granulocytes: 0.02 K/uL (ref 0.00–0.07)
Basophils Absolute: 0.1 K/uL (ref 0.0–0.1)
Basophils Relative: 1 %
Eosinophils Absolute: 0.1 K/uL (ref 0.0–0.5)
Eosinophils Relative: 2 %
HCT: 32.3 % — ABNORMAL LOW (ref 36.0–46.0)
Hemoglobin: 10.3 g/dL — ABNORMAL LOW (ref 12.0–15.0)
Immature Granulocytes: 0 %
Lymphocytes Relative: 29 %
Lymphs Abs: 2 K/uL (ref 0.7–4.0)
MCH: 24.6 pg — ABNORMAL LOW (ref 26.0–34.0)
MCHC: 31.9 g/dL (ref 30.0–36.0)
MCV: 77.3 fL — ABNORMAL LOW (ref 80.0–100.0)
Monocytes Absolute: 0.4 K/uL (ref 0.1–1.0)
Monocytes Relative: 6 %
Neutro Abs: 4.3 K/uL (ref 1.7–7.7)
Neutrophils Relative %: 62 %
Platelet Count: 522 K/uL — ABNORMAL HIGH (ref 150–400)
RBC: 4.18 MIL/uL (ref 3.87–5.11)
RDW: 16.8 % — ABNORMAL HIGH (ref 11.5–15.5)
WBC Count: 6.8 K/uL (ref 4.0–10.5)
nRBC: 0 % (ref 0.0–0.2)

## 2024-05-14 LAB — CMP (CANCER CENTER ONLY)
ALT: 14 U/L (ref 0–44)
AST: 18 U/L (ref 15–41)
Albumin: 4.1 g/dL (ref 3.5–5.0)
Alkaline Phosphatase: 58 U/L (ref 38–126)
Anion gap: 10 (ref 5–15)
BUN: 11 mg/dL (ref 6–20)
CO2: 22 mmol/L (ref 22–32)
Calcium: 8.8 mg/dL — ABNORMAL LOW (ref 8.9–10.3)
Chloride: 106 mmol/L (ref 98–111)
Creatinine: 0.69 mg/dL (ref 0.44–1.00)
GFR, Estimated: >60 mL/min (ref >60)
Glucose, Bld: 91 mg/dL (ref 70–99)
Potassium: 3.7 mmol/L (ref 3.5–5.1)
Sodium: 139 mmol/L (ref 135–145)
Total Bilirubin: 0.3 mg/dL (ref 0.0–1.2)
Total Protein: 7.6 g/dL (ref 6.5–8.1)

## 2024-05-14 LAB — TSH: TSH: 1.66 u[IU]/mL (ref 0.350–4.500)

## 2024-05-14 MED ORDER — SODIUM CHLORIDE 0.9 % IV SOLN: Freq: Once | INTRAVENOUS | Status: AC

## 2024-05-14 MED ORDER — SODIUM CHLORIDE 0.9 % IV SOLN
200.0000 mg | Freq: Once | INTRAVENOUS | Status: AC
  Administered 2024-05-14: 200 mg via INTRAVENOUS
  Filled 2024-05-14: qty 200

## 2024-05-14 NOTE — Progress Notes (Signed)
 [facility]   [phone] [phone]   Clinic Follow up Note   Patient Care Team: Jarold Medici, MD as PCP - General (Internal Medicine) [name], MD as PCP - Cardiology (Cardiology) [name], [name] as Physician Assistant (Emergency Medicine) Lanny Callander, MD as Consulting Physician (Oncology) Sheldon Standing, MD as Consulting Physician (General Surgery) [name], Dedra, MD as Consulting Physician (Neurology) [name], Krystal, MD as Consulting Physician (Obstetrics and Gynecology) [name], MD as Consulting Physician (Gastroenterology) [name], [name], NP as Nurse Practitioner (Nurse Practitioner)  Date of Service:  05/14/2024  CHIEF COMPLAINT: f/u of metastatic colon cancer  CURRENT THERAPY:  Keytruda  every 3 weeks  Oncology History   Malignant neoplasm of sigmoid colon [facility]) -MMR loss of [name]6, [name] with high mutation burden, acquired BRCA mutation (+)  --Initially presented with diarrhea 11/04/20, found to have diverticulitis with perforation and also developed an abscess requiring 2 drains, multiple hospital stay  --she started FOLFOX on 06/22/21 but did not respond -she switched to Keytruda  on 09/08/21. She has been tolerating well overall but has developed joint pain which could be related, overall manageable. She is clinically doing better also with more energy and less abdominal pain   -her CEA has normalized on immunotherapy. -CT CAP on 03/15/22 again showed mixed response -she had PET scan on 04/26/2022 for evaluation of her ovarian cysts, and saw GYN Dr. [name], we decided to monitor it. -She tolerating Keytruda  well, with mild joint pain.  She recently started workout at the gym, which has helped her joint pain. -restaging CT from 08/04/2022 showed stable disease in left abdomen and pelvis, no new lesions.  I personally reviewed her scan images, and discussed findings with patient.   -Will  continue Keytruda   -repeated CT scan on January 24, 2023 showed continuous response, no new lesions.  -PET on 05/04/2023 showed no definitive evidence of progression, some mildly hypermetabolic nodes, will continue monitoring, plan to stop Keytruda  in 09/2023 if no progression on next scan  -PET 08/29/2023 showed similar tiny left pericolic hypermetabolic nodules, most consistent with metastatic disease, no other definitive evidence of residual disease  -ctDNA GuardantReveal was negative on 10/13/2023, Signatera was also negative on 04/23/2024  -  Assessment & Plan Metastatic colon cancer involving liver and intrahepatic bile duct She continues to experience joint pain, muscle spasms, and new-onset blurred vision. The most recent Signatera test was negative, which is encouraging but does not confirm remission. The last CT scan was performed in September. PET scan is planned for restaging after her next chemotherapy/immunotherapy cycle. - Ordered PET scan for restaging after January 6 treatment cycle. - Scheduled chemotherapy/immunotherapy cycles for January 6 and January 27. - Planned review of PET scan results at follow-up visit on January 27. - Reviewed Signatera results (negative). - Reviewed last CT scan (September).  Plan to repeat PET scan in January 2026  Iron  deficiency anemia She has mild anemia, likely secondary to iron  deficiency, with previously low ferritin and ongoing menstruation contributing to iron  loss. Platelet count is slightly elevated, likely reactive to anemia. - Ordered repeat ferritin level. - Reviewed blood counts (mild anemia, slightly elevated platelets).  Blurred vision She reports new-onset blurred vision for approximately one month, now constant. Etiology is unclear and may be related to chemotherapy, immunotherapy, or a primary ophthalmologic disorder. - Recommended ophthalmology evaluation.  Joint pain and muscle spasms She continues to experience joint pain and  muscle spasms, unchanged from prior visits, possibly related to ongoing  immunotherapy.  Plan - She is clinically doing well overall, I encouraged her to call ophthalmology for eye exam due to her blurry vision. - Will proceed Keytruda  today and continue every 3 weeks - Follow-up in 3 weeks, plan to repeat PET scan in January 2026   SUMMARY OF ONCOLOGIC HISTORY: Oncology History Overview Note   Cancer Staging  Malignant neoplasm of sigmoid colon [facility]) Staging form: Colon and Rectum, AJCC 8th Edition - Pathologic stage from 04/15/2021: Stage IIC (pT4b, pN0, cM0) - Signed by Lanny Callander, MD on 06/12/2021    Malignant neoplasm of sigmoid colon [facility])   Initial Diagnosis   Malignant neoplasm of sigmoid colon (HCC)   11/04/2020 Imaging   EXAM: CT ABDOMEN AND PELVIS WITHOUT CONTRAST  IMPRESSION: 1. Perforating descending colonic diverticulitis with multiple abdominopelvic gas and fluid collections, measuring up to 5.7 cm and further described above. 2. Cholelithiasis without findings of acute cholecystitis. 3. 3.6 cm benign left adrenal adenoma. 4. Leiomyomatous uterus. 5. 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   11/07/2020 Imaging   EXAM: CT ABDOMEN AND PELVIS WITHOUT CONTRAST  IMPRESSION: Continued wall thickening is seen involving descending colon suggesting infectious or inflammatory colitis or perforated diverticulitis. There is an adjacent fluid collection measuring 7.0 x 5.0 cm consistent with abscess which is significantly enlarged compared to prior exam. Adjacent to the abscess, there is a severely thickened small bowel loop most consistent with secondary inflammation.   5.2 x 3.5 cm fluid collection is noted within the left psoas muscle consistent with abscess which is significantly enlarged compared to prior exam. 4.4 x 3.8 cm fluid collection consistent with abscess is noted in  the left retroperitoneal region which is also enlarged compared to prior exam.   Fibroid uterus.   Cholelithiasis.   11/27/2020 Imaging   EXAM: CT ABDOMEN AND PELVIS WITHOUT CONTRAST  IMPRESSION: 1. Significantly interval decreased size of the previously visualized left retroperitoneal and left anterior mid abdominal fluid collections. There is persistent fat stranding and mural thickening of the mid descending colon at this level. 2. Unchanged leiomyomatous uterus. 3. Unchanged cholelithiasis.   12/11/2020 Imaging   EXAM: CT ABDOMEN AND PELVIS WITHOUT CONTRAST  IMPRESSION: 1. Stable inflammatory changes centered around the distal descending colon in the left lower abdomen. Pericolonic inflammatory changes have minimally changed since 11/27/2020. Small pocket of gas medial to the colon is probably associated with a fistula or small residual abscess collection. 2. Stable position of the two percutaneous drains. The more anterior drain may be extending through a portion of the small bowel. 3. Cholelithiasis. 4. Fibroid uterus. Cannot exclude an ovarian/adnexal cystic structure near the uterine fundus. 5. Left adrenal adenoma.   01/07/2021 Imaging   EXAM: CT ABDOMEN AND PELVIS WITH CONTRAST  IMPRESSION: 1. No new abscesses identified. Similar degree of soft tissue thickening seen in the left pericolonic region. The degree of persistent soft tissues thickening in the pericolonic space further raises suspicions for malignancy. Further evaluation with colonoscopy should be performed. 2. Left retroperitoneal abscess drain unchanged in position. Anterior left abdominal drain again seen terminating within small bowel loop. 3. 2.6 cm mildly sclerotic lesion noted in the L2 vertebral body. Further evaluation with contrast enhanced lumbar spine MRI should be performed.   02/04/2021 Imaging   EXAM: CT ABDOMEN AND PELVIS WITH CONTRAST  IMPRESSION: No new abdominopelvic collections or  abscess development in the 1 month interval.   Left  anterior drain remains within a loop of small bowel, unchanged.   Left lateral abscess drain remains in the retroperitoneal space adjacent to the iliopsoas muscle with a small amount of residual fluid and air but no measurable collection.   Stable soft tissue prominence and pericolonic strandy edema/inflammation about the left descending colon compatible with residual diverticulitis/colitis. Difficult to exclude underlying transmural lesion.   04/01/2021 Imaging   EXAM: CT ABDOMEN AND PELVIS WITH CONTRAST  IMPRESSION: There appears to be significant enhancement and wall thickening involving the descending colon with some degree of traction and involvement of adjacent small bowel loops. This is consistent with the history of diverticulitis and perforation. Stable position of percutaneous drainage catheter is seen adjacent to left psoas muscle with no significant residual fluid remaining. The other percutaneous drainage catheter that was previously noted to be within small bowel loop on prior exam in the left lower quadrant, has significantly retracted and appears to be outside of the peritoneal space at this time.   Since the prior exam, there does appear to be some degree of rotation involving mesenteric vessels and structures in the right lower quadrant, suggesting partial volvulus or malrotation. There is the interval development of several lymph nodes in this area, most likely inflammatory or reactive in etiology. Mild amount of free fluid is also noted in the pelvis. However, no significant bowel wall thickening or dilatation is seen in this area. These results will be called to the ordering clinician or representative by the Radiologist Assistant, and communication documented in the PACS or zVision Dashboard.   Stable uterine fibroid.   Hepatic steatosis.   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   Cholelithiasis.    04/10/2021 Imaging   EXAM: CT ANGIOGRAPHY CHEST CT ABDOMEN AND PELVIS WITH CONTRAST  IMPRESSION: 1. Again seen are findings compatible with descending colon diverticulitis with perforation. Free air and inflammation have increased in the interval. 2. New lobulated enhancing fluid collection posterior to this segment of inflamed colon measuring 8.5 x 3.5 x 10.0 cm. This collection now invades the adjacent iliopsoas muscle as well as extends through the left lateral abdominal wall. The tip of the drainage catheter is in this collection. Findings are compatible with abscess. 3. Anterior left percutaneous drainage catheter tip has been pulled back and is now within the subcutaneous tissues. 4. Trace free fluid. 5. No pulmonary embolism.  No acute cardiopulmonary process. 6. Cholelithiasis. 7. Fatty infiltration of the liver.   04/15/2021 Definitive Surgery   FINAL MICROSCOPIC DIAGNOSIS:   A. SMALL BOWEL, RESECTION:  - Adenocarcinoma.  - No carcinoma identified in 1 lymph node.   B. PERFORATED LEFT COLON, RESECTION:  - Moderately differentiated colonic adenocarcinoma.  - Tumor extends into pericolonic adipose tissue, and is strongly  suggestive of invasion into small bowel.  See oncology table/comments.  - No carcinoma identified in 4 lymph nodes (0/4).  - Tubular adenoma with high-grade dysplasia, 1.  - Tubular adenomas with low grade dysplasia, 3.   Comments: The size of the tumor is difficult to estimate secondary to the disrupted nature of the specimen, as well as the infiltrative nature of the tumor.  Tumor can be identified as definitely invading into the pericolonic adipose tissue (block B4), but given the extreme disruption of the tissue, and the involvement of the small bowel by what appears to be a colonic adenocarcinoma, I favor perforation of the large bowel with direct invasion into the small bowel.  Accordingly, I believe this is best regarded as  a pT4b lesion.    ADDENDUM:  Mismatch Repair Protein (IHC)  SUMMARY INTERPRETATION: ABNORMAL  There is loss of the major MMR protein [name]6: This indicates a high probability that a hereditary germline mutation is present and referral to genetic counseling is warranted. It is recommended that the loss of protein expression be correlated with molecular based microsatellite instability testing.   IHC EXPRESSION RESULTS  TEST           RESULT  MLH1:          Preserved nuclear expression  [name]2:          Preserved nuclear expression  [name]6:          LOSS OF NUCLEAR EXPRESSION  PMS2:          Preserved nuclear expression    04/15/2021 Cancer Staging   Staging form: Colon and Rectum, AJCC 8th Edition - Pathologic stage from 04/15/2021: Stage IIC (pT4b, pN0, cM0) - Signed by Lanny Callander, MD on 06/12/2021 Stage prefix: Initial diagnosis Total positive nodes: 0 Histologic grading system: 4 grade system Histologic grade (G): G2 Residual tumor (R): R0 - None   04/29/2021 Imaging   EXAM: CT ABDOMEN AND PELVIS WITH CONTRAST  IMPRESSION: Continued left retroperitoneal abscesses inferior to the left kidney, involving the left psoas muscle and left abdominal wall musculature. Overall size is decreased since prior study. Interval removal of left lower quadrant abscess drainage catheter.   New fluid collection in the cul-de-sac and wrapping around the uterus concerning for abscess.   Cholelithiasis.   Small left pleural effusion.  Bibasilar atelectasis.   Stable left adrenal adenoma.  ADDENDUM: After discussing the case with Dr. Katha in interventional radiology, it was noted that the fluid in the pelvis originally thought to be in the cul-de-sac is likely within the vagina, best seen on sagittal imaging. Recommend speculum exam.   Also, in the left lateral wall in the area of prior abscess in the lateral wall abdominal musculature, some of the abdomen soft tissue appears to be enhancing. While this could be  infectious, cannot exclude tumor seeding in the left lateral abdominal wall, measuring 6.9 x 3.8 cm on image 64 of series 2.   06/16/2021 Imaging   EXAM: CT ABDOMEN AND PELVIS WITH CONTRAST  IMPRESSION: Increased size of bulky soft tissue masses involving the left psoas muscle and left lateral abdominal wall soft tissues, consistent with progressive metastatic disease.   Significant progression of multiple peritoneal masses throughout the abdomen and pelvis, consistent with peritoneal carcinomatosis.   New mild retroperitoneal lymphadenopathy, consistent with metastatic disease.   Stable uterine fibroid and left adrenal adenoma.   Cholelithiasis, without evidence of cholecystitis.   06/22/2021 - 08/19/2021 Chemotherapy   Patient is on Treatment Plan : COLORECTAL FOLFOX q14d x 6 months     06/22/2021 Tumor Marker   Patient's tumor was tested for the following markers: CEA. Results of the tumor marker test revealed 87.41.   07/10/2021 Pathology Results   FINAL MICROSCOPIC DIAGNOSIS:   A. PERITONEAL MASS, LEFT UPPER ABDOMINAL QUADRANT, BIOPSY:  -  Metastatic adenocarcinoma with necrosis, histologically consistent with colon primary.     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 The report date is 07/16/2021. The remainder of testing was negative/normal.  The CancerNext-Expanded + RNAinsight gene panel offered by [facility] and includes sequencing and rearrangement analysis for the following 77 genes: IP, ALK, APC*, ATM*, AXIN2, BAP1, BARD1, BLM, BMPR1A, BRCA1*,  BRCA2*, BRIP1*, CDC73, CDH1*,CDK4, CDKN1B, CDKN2A, CHEK2*, CTNNA1, DICER1, FANCC, FH, FLCN, GALNT12, KIF1B, LZTR1, MAX, MEN1, MET, MLH1*, [name]2*, [name]3, [name]6*, MUTYH*, NBN, NF1*, NF2, NTHL1, PALB2*, PHOX2B, PMS2*, POT1, PRKAR1A, PTCH1, PTEN*, RAD51C*, RAD51D*,RB1, RECQL, RET, SDHA, SDHAF2, SDHB, SDHC, SDHD, SMAD4, SMARCA4, SMARCB1, SMARCE1, STK11, SUFU,  TMEM127, TP53*,TSC1, TSC2, VHL and XRCC2 (sequencing and deletion/duplication); EGFR, EGLN1, HOXB13, KIT, MITF, PDGFRA, POLD1 and POLE (sequencing only); EPCAM and GREM1 (deletion/duplication only).   08/28/2021 Imaging   EXAM: CT CHEST, ABDOMEN, AND PELVIS WITH CONTRAST  IMPRESSION: 1. Continued interval progression of multiple metastatic soft tissue nodules/masses in the abdomen and pelvis. 2. No evidence for metastatic disease in the chest. 3. Similar appearance of the subtle lesion in the L2 vertebral body, suspicious for metastatic involvement. 4. Cholelithiasis.   09/03/2021 -  Chemotherapy   Patient is on Treatment Plan : COLORECTAL Pembrolizumab  (200) q21d     09/08/2021 - 02/01/2022 Chemotherapy   Patient is on Treatment Plan : COLORECTAL Pembrolizumab  (200) q21d     01/08/2022 Imaging   CT CAP IMPRESSION: 1. Mixed response to therapy. Interval decrease in size of the conglomerate multilobulated mass extending from the left psoas into the left posterior pararenal space through the left lateral abdominal wall along the tract of the previously placed percutaneous drainage catheter. Interval decrease in size of right adnexal mass and omental masses within the right lower quadrant of the abdomen. Interval development of peritoneal carcinomatosis with omental caking within the left lower quadrant of the abdomen and left subdiaphragmatic region adjacent to the spleen and along the left pericolic gutter as well as development of ascites appearing loculated within the anterior peritoneum. 2. Interval development of mild left hydronephrosis secondary to stricturing of the mid left ureter. 3. Stable left adrenal metastasis. 4. Stable sclerotic lesion within the L2 vertebral body. No new lytic or blastic bone lesions are seen. 5. Cholelithiasis. 6. No evidence of intrathoracic metastatic disease. 7. 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 Small fat and fluid  containing parastomal hernia. Moderate colonic stool burden. No evidence of obstruction.   03/15/2022 Imaging   IMPRESSION: 1. Status post sigmoid colon resection with left lower quadrant end colostomy. 2. Multicystic bilateral ovarian lesions are slightly increased in size. 3. Diminished, small volume of loculated appearing ascites throughout the abdomen and pelvis, with similar appearance of peritoneal thickening and nodularity throughout. 4. Heterogeneously calcified, conglomerate masses in the left retroperitoneum and abdominal wall are slightly diminished in size. 5. Left adrenal lesion not significantly changed. 6. Unchanged faintly sclerotic metastatic lesion of L2. 7. Constellation of findings is consistent with mixed response to treatment however with overall little interval change. 8. Minimal residual left hydronephrosis and proximal hydroureter, improved compared to prior examination. 9. Cholelithiasis. 10. Coronary artery disease, significantly advanced for patient age.   08/04/2022 Imaging    IMPRESSION: CT CHEST:   1. No developing mass lesion, fluid collection or lymph node enlargement in the thorax. Stable tiny 3 mm lung nodule. 2. Coronary artery calcifications. Please correlate for other coronary risk factors. 3. Chest port   CT ABDOMEN AND PELVIS:   1. Extensive surgical changes identified. Left lower quadrant ostomy, diverting colostomy. Surgical changes along loops of small bowel in the right hemiabdomen. 2. Once again there are areas of nodular tissue extending along the left lateral abdominal wall, into the retroperitoneum which are similar to previous. The amount of adjacent fluid in these locations has improved. 3. No new mass lesion, fluid collection or lymph  node enlargement in the abdomen or pelvis. 4. Gallstones. 5. Enlarged uterus with fibroids. Bilateral complex cystic areas are once again seen and based on appearance differential includes dilated  fallopian tubes, hydrosalpinx versus true cystic lesions. Please correlate for any known history or prior workup 6. Limited evaluation for solid organ pathology metastatic disease without the advantage of IV contrast     05/04/2023 PET scan   PET - restaging - skull base to thigh  IMPRESSION: 1. Subcentimeter peritoneal nodules in the left abdomen have decreased in size and hypermetabolism from 04/26/2022, compatible with treated metastatic disease. 2. Chronic focal hypermetabolism associated with a gallbladder nodule. Malignancy cannot be excluded. 3. Small hypermetabolic to borderline hypermetabolic external iliac and left inguinal lymph nodes and minimally hypermetabolic small bilateral cervical lymph nodes, nonspecific. Metastatic disease not excluded. Recommend attention on follow-up. 4. Left adrenal adenoma. 5. Cystic right adnexal mass is inadequately characterized. Because this lesion is not adequately characterized, prompt US  is recommended for further evaluation. Note: This recommendation does not apply to premenarchal patients and to those with increased risk (genetic, family history, elevated tumor markers or other high-risk factors) of ovarian cancer. Reference: JACR 2020 Feb; 17(2):248-254 6. 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 No developing mass lesion, fluid collection or lymph node enlargement in the thorax. Stable tiny 3 mm lung nodule. 2. Coronary artery calcifications. Please correlate for other coronary risk factors. 3. Chest port   CT ABDOMEN AND PELVIS:   1. Extensive surgical changes identified. Left lower quadrant ostomy, diverting colostomy. Surgical  changes along loops of small bowel in the right hemiabdomen. 2. Once again there are areas of nodular tissue extending along the left lateral abdominal wall, into the retroperitoneum which are similar to previous. The amount of adjacent fluid in these locations has improved. 3. No new mass lesion, fluid collection or lymph node enlargement in the abdomen or pelvis. 4. Gallstones. 5. Enlarged uterus with fibroids. Bilateral complex cystic areas are once again seen and based on appearance differential includes dilated fallopian tubes, hydrosalpinx versus true cystic lesions. Please correlate for any known history or prior workup 6. Limited evaluation for solid organ pathology metastatic disease without the advantage of IV contrast        Discussed the use of AI scribe software for clinical note transcription with the patient, who gave verbal consent to proceed.  History of Present Illness Mikayla Rios is a 38 year old female with metastatic colon cancer involving the liver and intrahepatic bile duct who presents for routine follow-up and management of cancer-related symptoms and treatment effects.  She notes no overall change in her condition since the last visit and has ongoing arthralgias and muscle spasms without change in severity.  Over the past month she has developed constant blurred vision, initially intermittent but now persistent. She denies diplopia and can drive but needs frequent blinking to clear her vision. She has not yet seen ophthalmology.  She is currently menstruating and has mild anemia with previously low ferritin and slightly elevated platelets. She denies bleeding outside of menses.  She has completed prior chemotherapy and immunotherapy. Her most recent Signatera test was negative. Her last CT was in  September, and a PET scan is planned for restaging after her next treatment cycle in January. She has no current medication refill needs.     All other systems  were reviewed with the patient and are negative.  MEDICAL HISTORY:  Past Medical History:  Diagnosis Date   Allergy    seasonal   Anemia    Blood infection 1985   Blood transfusion without reported diagnosis    colon ca 04/2021   Diverticulitis    Family history of breast cancer    Family history of colon cancer    Family history of colon cancer    Family history of stomach cancer    Hypertension    Obesity    Sleep apnea     SURGICAL HISTORY: Past Surgical History:  Procedure Laterality Date   COLECTOMY WITH COLOSTOMY CREATION/HARTMANN PROCEDURE N/A 04/15/2021   Procedure: COLOSTOMY CREATION/HARTMANN PROCEDURE;  Surgeon: Sheldon Standing, MD;  Location: [facility];  Service: General;  Laterality: N/A;   IR CATHETER TUBE CHANGE  12/12/2020   IR CATHETER TUBE CHANGE  01/27/2021   IR CATHETER TUBE CHANGE  02/19/2021   IR CATHETER TUBE CHANGE  04/13/2021   IR IMAGING GUIDED PORT INSERTION  10/02/2021   IR RADIOLOGIST EVAL & MGMT  11/27/2020   IR RADIOLOGIST EVAL & MGMT  12/11/2020   IR RADIOLOGIST EVAL & MGMT  01/07/2021   IR RADIOLOGIST EVAL & MGMT  02/04/2021   IR SINUS/FIST TUBE CHK-NON GI  12/12/2020   IR SINUS/FIST TUBE CHK-NON GI  02/19/2021   LAPAROSCOPIC PARTIAL COLECTOMY N/A 04/15/2021   Procedure: LAPAROSCOPIC ASSISTED HARTMANN RESECTION;  Surgeon: Sheldon Standing, MD;  Location: [facility];  Service: General;  Laterality: N/A;    I have reviewed the social history and family history with the patient and they are unchanged from previous note.  ALLERGIES:  is allergic to chlorhexidine  gluconate, shellfish allergy, and penicillins.  MEDICATIONS:  Current Outpatient Medications  Medication Sig Dispense Refill   acetaminophen -codeine  (TYLENOL  #3) 300-30 MG tablet Take 1 tablet by mouth every 8 (eight) hours as needed for moderate pain (pain score 4-6). 30 tablet 0   ALPRAZolam  (XANAX ) 0.25 MG tablet Take 1 tablet (0.25 mg total) by mouth at bedtime as needed for anxiety. 30 tablet  0   amLODipine  (NORVASC ) 10 MG tablet TAKE 1 TABLET BY MOUTH EVERY DAY 90 tablet 3   aspirin  EC 81 MG tablet Take 1 tablet (81 mg total) by mouth daily. Swallow whole.     atorvastatin  (LIPITOR) 40 MG tablet TAKE 1 TABLET BY MOUTH EVERY DAY 90 tablet 2   carvedilol  (COREG ) 12.5 MG tablet TAKE 1.5 TABLETS BY MOUTH 2 (TWO) TIMES DAILY. 270 tablet 2   diclofenac  Sodium (VOLTAREN ) 1 % GEL Apply 2 g topically 4 (four) times daily. 100 g 2   dicyclomine  (BENTYL ) 10 MG capsule Take 1 capsule (10 mg total) by mouth every 8 (eight) hours as needed for spasms. 20 capsule 0   furosemide  (LASIX ) 20 MG tablet TAKE 1 TABLET BY MOUTH EVERY DAY AS NEEDED 90 tablet 1   gabapentin  (NEURONTIN ) 300 MG capsule Take 1 capsule (300 mg total) by mouth at bedtime. 90 capsule 1   KLOR-CON  M20 20 MEQ tablet TAKE 1 TABLET BY MOUTH EVERY DAY 90 tablet 1   lidocaine -prilocaine  (EMLA ) cream Apply 1 Application topically as needed. 30 g 3   methocarbamol  (ROBAXIN -750) 750 MG tablet Take 1 tablet (750 mg total) by mouth every 8 (eight) hours as needed  for muscle spasms. 60 tablet 0   prochlorperazine  (COMPAZINE ) 10 MG tablet Take 1 tablet (10 mg total) by mouth every 8 (eight) hours as needed for nausea or vomiting. 30 tablet 0   traZODone  (DESYREL ) 50 MG tablet TAKE 1-2 TABLETS BY MOUTH AT BEDTIME AS NEEDED FOR SLEEP. 180 tablet 1   triamcinolone  cream (KENALOG ) 0.1 % APPLY TO AFFECTED AREA TWICE DAILY AS NEEDED 30 g 0   valsartan  (DIOVAN ) 320 MG tablet Take 1 tablet (320 mg total) by mouth daily. 30 tablet 3   No current facility-administered medications for this visit.   Facility-Administered Medications Ordered in Other Visits  Medication Dose Route Frequency Provider Last Rate Last Admin   fentaNYL  (SUBLIMAZE ) injection   Intravenous PRN Mir, Farhaan R, MD   50 mcg at 10/02/21 1436   midazolam  (VERSED ) injection   Intravenous PRN Mir, Farhaan R, MD   1 mg at 10/02/21 1436    PHYSICAL EXAMINATION: ECOG PERFORMANCE  STATUS: 1 - Symptomatic but completely ambulatory  Vitals:   05/14/24 1349  BP: 128/80  Pulse: 97  Resp: 17  Temp: (!) 97.3 F (36.3 C)  SpO2: 99%   Wt Readings from Last 3 Encounters:  05/14/24 (!) 365 lb 4.8 oz (165.7 kg)  04/23/24 (!) 375 lb (170.1 kg)  04/19/24 (!) 362 lb 3.2 oz (164.3 kg)     GENERAL:alert, no distress and comfortable SKIN: skin color, texture, turgor are normal, no rashes or significant lesions EYES: normal, Conjunctiva are pink and non-injected, sclera clear NECK: supple, thyroid  normal size, non-tender, without nodularity LYMPH:  no palpable lymphadenopathy in the cervical, axillary  LUNGS: clear to auscultation and percussion with normal breathing effort HEART: regular rate & rhythm and no murmurs and no lower extremity edema ABDOMEN:abdomen soft, non-tender and normal bowel sounds Musculoskeletal:no cyanosis of digits and no clubbing  NEURO: alert & oriented x 3 with fluent speech, no focal motor/sensory deficits  Physical Exam    LABORATORY DATA:  I have reviewed the data as listed    Latest Ref Rng & Units 05/14/2024    1:40 PM 04/19/2024   12:46 PM 03/29/2024   12:43 PM  CBC  WBC 4.0 - 10.5 K/uL 6.8  6.9  7.7   Hemoglobin 12.0 - 15.0 g/dL 89.6  89.7  89.6   Hematocrit 36.0 - 46.0 % 32.3  32.0  32.0   Platelets 150 - 400 K/uL 522  482  492         Latest Ref Rng & Units 05/14/2024    1:40 PM 04/19/2024   12:46 PM 03/29/2024   12:43 PM  CMP  Glucose 70 - 99 mg/dL 91  96  98   BUN 6 - 20 mg/dL 11  17  19    Creatinine 0.44 - 1.00 mg/dL 9.30  9.33  9.09   Sodium 135 - 145 mmol/L 139  138  139   Potassium 3.5 - 5.1 mmol/L 3.7  4.2  4.0   Chloride 98 - 111 mmol/L 106  108  107   CO2 22 - 32 mmol/L 22  24  24    Calcium  8.9 - 10.3 mg/dL 8.8  8.8  8.7   Total Protein 6.5 - 8.1 g/dL 7.6  7.3  7.2   Total Bilirubin 0.0 - 1.2 mg/dL 0.3  0.3  0.3   Alkaline Phos 38 - 126 U/L 58  50  49   AST 15 - 41 U/L 18  11  12    ALT  0 - 44 U/L 14  12   12        RADIOGRAPHIC STUDIES: I have personally reviewed the radiological images as listed and agreed with the findings in the report. No results found.    Orders Placed This Encounter  Procedures   NM PET Image Restag (PS) Skull Base To Thigh    Standing Status:   Future    Expected Date:   06/26/2024    Expiration Date:   05/14/2025    If indicated for the ordered procedure, I authorize the administration of a radiopharmaceutical per Radiology protocol:   Yes    Is the patient pregnant?:   No    Preferred imaging location?:   [facility]   [facility]    Standing Status:   Standing    Number of Occurrences:   30    Expiration Date:   05/14/2025   All questions were answered. The patient knows to call the clinic with any problems, questions or concerns. No barriers to learning was detected. The total time spent in the appointment was 25 minutes, including review of chart and various tests results, discussions about plan of care and coordination of care plan     [name], MD 05/14/2024

## 2024-05-15 LAB — CEA (ACCESS): CEA (CHCC): <1 ng/mL (ref 0.00–5.00)

## 2024-05-22 ENCOUNTER — Encounter: Payer: Self-pay | Admitting: Internal Medicine

## 2024-06-05 ENCOUNTER — Other Ambulatory Visit (HOSPITAL_COMMUNITY): Payer: Self-pay

## 2024-06-14 ENCOUNTER — Other Ambulatory Visit: Payer: Self-pay

## 2024-06-14 ENCOUNTER — Encounter (HOSPITAL_COMMUNITY): Payer: Self-pay

## 2024-06-14 ENCOUNTER — Other Ambulatory Visit (HOSPITAL_COMMUNITY): Payer: Self-pay

## 2024-06-15 ENCOUNTER — Inpatient Hospital Stay: Attending: Hematology

## 2024-06-15 ENCOUNTER — Telehealth: Payer: Self-pay

## 2024-06-15 VITALS — BP 136/75 | HR 79 | Temp 97.6°F | Resp 16 | Wt 370.0 lb

## 2024-06-15 DIAGNOSIS — D259 Leiomyoma of uterus, unspecified: Secondary | ICD-10-CM | POA: Insufficient documentation

## 2024-06-15 DIAGNOSIS — Z5112 Encounter for antineoplastic immunotherapy: Secondary | ICD-10-CM | POA: Diagnosis present

## 2024-06-15 DIAGNOSIS — C187 Malignant neoplasm of sigmoid colon: Secondary | ICD-10-CM

## 2024-06-15 DIAGNOSIS — C786 Secondary malignant neoplasm of retroperitoneum and peritoneum: Secondary | ICD-10-CM

## 2024-06-15 DIAGNOSIS — K5720 Diverticulitis of large intestine with perforation and abscess without bleeding: Secondary | ICD-10-CM | POA: Insufficient documentation

## 2024-06-15 DIAGNOSIS — Z791 Long term (current) use of non-steroidal anti-inflammatories (NSAID): Secondary | ICD-10-CM | POA: Insufficient documentation

## 2024-06-15 DIAGNOSIS — J90 Pleural effusion, not elsewhere classified: Secondary | ICD-10-CM | POA: Insufficient documentation

## 2024-06-15 DIAGNOSIS — I2510 Atherosclerotic heart disease of native coronary artery without angina pectoris: Secondary | ICD-10-CM | POA: Diagnosis not present

## 2024-06-15 DIAGNOSIS — C7951 Secondary malignant neoplasm of bone: Secondary | ICD-10-CM | POA: Insufficient documentation

## 2024-06-15 DIAGNOSIS — D3502 Benign neoplasm of left adrenal gland: Secondary | ICD-10-CM | POA: Insufficient documentation

## 2024-06-15 DIAGNOSIS — D75839 Thrombocytosis, unspecified: Secondary | ICD-10-CM | POA: Diagnosis not present

## 2024-06-15 DIAGNOSIS — Z1509 Genetic susceptibility to other malignant neoplasm: Secondary | ICD-10-CM | POA: Diagnosis not present

## 2024-06-15 DIAGNOSIS — D649 Anemia, unspecified: Secondary | ICD-10-CM | POA: Insufficient documentation

## 2024-06-15 DIAGNOSIS — Z79899 Other long term (current) drug therapy: Secondary | ICD-10-CM | POA: Insufficient documentation

## 2024-06-15 DIAGNOSIS — J9811 Atelectasis: Secondary | ICD-10-CM | POA: Insufficient documentation

## 2024-06-15 DIAGNOSIS — K760 Fatty (change of) liver, not elsewhere classified: Secondary | ICD-10-CM | POA: Diagnosis not present

## 2024-06-15 DIAGNOSIS — D500 Iron deficiency anemia secondary to blood loss (chronic): Secondary | ICD-10-CM

## 2024-06-15 DIAGNOSIS — N83209 Unspecified ovarian cyst, unspecified side: Secondary | ICD-10-CM | POA: Diagnosis not present

## 2024-06-15 DIAGNOSIS — K8020 Calculus of gallbladder without cholecystitis without obstruction: Secondary | ICD-10-CM | POA: Insufficient documentation

## 2024-06-15 DIAGNOSIS — M2550 Pain in unspecified joint: Secondary | ICD-10-CM | POA: Diagnosis not present

## 2024-06-15 DIAGNOSIS — Z79620 Long term (current) use of immunosuppressive biologic: Secondary | ICD-10-CM | POA: Insufficient documentation

## 2024-06-15 DIAGNOSIS — K6819 Other retroperitoneal abscess: Secondary | ICD-10-CM | POA: Insufficient documentation

## 2024-06-15 DIAGNOSIS — R911 Solitary pulmonary nodule: Secondary | ICD-10-CM | POA: Insufficient documentation

## 2024-06-15 DIAGNOSIS — L299 Pruritus, unspecified: Secondary | ICD-10-CM | POA: Insufficient documentation

## 2024-06-15 DIAGNOSIS — Z7982 Long term (current) use of aspirin: Secondary | ICD-10-CM | POA: Diagnosis not present

## 2024-06-15 DIAGNOSIS — G893 Neoplasm related pain (acute) (chronic): Secondary | ICD-10-CM | POA: Diagnosis not present

## 2024-06-15 LAB — CMP (CANCER CENTER ONLY)
ALT: 12 U/L (ref 0–44)
AST: 16 U/L (ref 15–41)
Albumin: 4.2 g/dL (ref 3.5–5.0)
Alkaline Phosphatase: 57 U/L (ref 38–126)
Anion gap: 11 (ref 5–15)
BUN: 16 mg/dL (ref 6–20)
CO2: 21 mmol/L — ABNORMAL LOW (ref 22–32)
Calcium: 9.2 mg/dL (ref 8.9–10.3)
Chloride: 107 mmol/L (ref 98–111)
Creatinine: 0.77 mg/dL (ref 0.44–1.00)
GFR, Estimated: >60 mL/min
Glucose, Bld: 99 mg/dL (ref 70–99)
Potassium: 4.3 mmol/L (ref 3.5–5.1)
Sodium: 139 mmol/L (ref 135–145)
Total Bilirubin: 0.2 mg/dL (ref 0.0–1.2)
Total Protein: 7.8 g/dL (ref 6.5–8.1)

## 2024-06-15 LAB — GENETIC SCREENING ORDER

## 2024-06-15 LAB — CBC WITH DIFFERENTIAL (CANCER CENTER ONLY)
Abs Immature Granulocytes: 0.02 K/uL (ref 0.00–0.07)
Basophils Absolute: 0 K/uL (ref 0.0–0.1)
Basophils Relative: 1 %
Eosinophils Absolute: 0.1 K/uL (ref 0.0–0.5)
Eosinophils Relative: 2 %
HCT: 31.8 % — ABNORMAL LOW (ref 36.0–46.0)
Hemoglobin: 10.2 g/dL — ABNORMAL LOW (ref 12.0–15.0)
Immature Granulocytes: 0 %
Lymphocytes Relative: 23 %
Lymphs Abs: 1.6 K/uL (ref 0.7–4.0)
MCH: 24.7 pg — ABNORMAL LOW (ref 26.0–34.0)
MCHC: 32.1 g/dL (ref 30.0–36.0)
MCV: 77 fL — ABNORMAL LOW (ref 80.0–100.0)
Monocytes Absolute: 0.5 K/uL (ref 0.1–1.0)
Monocytes Relative: 7 %
Neutro Abs: 4.8 K/uL (ref 1.7–7.7)
Neutrophils Relative %: 67 %
Platelet Count: 535 K/uL — ABNORMAL HIGH (ref 150–400)
RBC: 4.13 MIL/uL (ref 3.87–5.11)
RDW: 16.7 % — ABNORMAL HIGH (ref 11.5–15.5)
WBC Count: 7.1 K/uL (ref 4.0–10.5)
nRBC: 0 % (ref 0.0–0.2)

## 2024-06-15 LAB — FERRITIN: Ferritin: 15 ng/mL (ref 11–307)

## 2024-06-15 MED ORDER — SODIUM CHLORIDE 0.9 % IV SOLN: Freq: Once | INTRAVENOUS | Status: AC

## 2024-06-15 MED ORDER — SODIUM CHLORIDE 0.9 % IV SOLN
200.0000 mg | Freq: Once | INTRAVENOUS | Status: AC
  Administered 2024-06-15: 200 mg via INTRAVENOUS
  Filled 2024-06-15: qty 200

## 2024-06-15 NOTE — Telephone Encounter (Signed)
 Oral Oncology Patient Advocate Encounter  Prior Authorization for Emla  cream has been approved.    PA# [id number] Effective dates: 06/15/24 through 09/13/24   Lucie Lamer, CPhT [place]  [facility] Patient Advocate Specialist [facility] [place] Phone: [phone]  Fax: [phone] [email or web address]

## 2024-06-15 NOTE — Patient Instructions (Signed)

## 2024-06-15 NOTE — Telephone Encounter (Signed)
 Oral Oncology Patient Advocate Encounter   Received notification that prior authorization for lidocaine -prilocaine  (EMLA ) cream  is required.   PA submitted on 06/15/24 Key [id number] Status is pending     Lucie Lamer, CPhT [place]  [facility] On[facility] Pharmacy Patient Advocate Specialist [facility] [place] Phone: [phone]  Fax: [phone] [email or web address]

## 2024-06-18 ENCOUNTER — Other Ambulatory Visit (HOSPITAL_COMMUNITY): Payer: Self-pay

## 2024-06-18 LAB — CEA (ACCESS): CEA (CHCC): <1 ng/mL (ref 0.00–5.00)

## 2024-06-25 ENCOUNTER — Encounter (HOSPITAL_COMMUNITY): Admission: RE | Admit: 2024-06-25 | Source: Ambulatory Visit

## 2024-06-26 LAB — SIGNATERA
SIGNATERA MTM READOUT: 0 MTM/ml
SIGNATERA TEST RESULT: NEGATIVE

## 2024-06-27 ENCOUNTER — Encounter: Payer: Self-pay | Admitting: Internal Medicine

## 2024-06-29 ENCOUNTER — Other Ambulatory Visit: Payer: Self-pay

## 2024-07-02 ENCOUNTER — Encounter (HOSPITAL_COMMUNITY): Admission: RE | Admit: 2024-07-02 | Source: Ambulatory Visit

## 2024-07-05 ENCOUNTER — Other Ambulatory Visit: Payer: Self-pay | Admitting: Nurse Practitioner

## 2024-07-05 DIAGNOSIS — C187 Malignant neoplasm of sigmoid colon: Secondary | ICD-10-CM

## 2024-07-06 ENCOUNTER — Inpatient Hospital Stay

## 2024-07-06 ENCOUNTER — Inpatient Hospital Stay: Admitting: Nurse Practitioner

## 2024-07-06 ENCOUNTER — Other Ambulatory Visit: Payer: Self-pay

## 2024-07-06 ENCOUNTER — Encounter (HOSPITAL_COMMUNITY)
Admission: RE | Admit: 2024-07-06 | Discharge: 2024-07-06 | Disposition: A | Source: Ambulatory Visit | Attending: Hematology

## 2024-07-06 ENCOUNTER — Encounter: Payer: Self-pay | Admitting: Nurse Practitioner

## 2024-07-06 VITALS — BP 160/74 | HR 81 | Temp 99.0°F | Resp 18 | Ht 63.0 in | Wt 363.2 lb

## 2024-07-06 VITALS — BP 160/74 | HR 81 | Temp 99.0°F | Resp 18 | Wt 363.2 lb

## 2024-07-06 DIAGNOSIS — C187 Malignant neoplasm of sigmoid colon: Secondary | ICD-10-CM | POA: Insufficient documentation

## 2024-07-06 DIAGNOSIS — C786 Secondary malignant neoplasm of retroperitoneum and peritoneum: Secondary | ICD-10-CM

## 2024-07-06 DIAGNOSIS — Z5112 Encounter for antineoplastic immunotherapy: Secondary | ICD-10-CM | POA: Diagnosis not present

## 2024-07-06 LAB — CBC WITH DIFFERENTIAL (CANCER CENTER ONLY)
Abs Immature Granulocytes: 0.03 10*3/uL (ref 0.00–0.07)
Basophils Absolute: 0.1 10*3/uL (ref 0.0–0.1)
Basophils Relative: 1 %
Eosinophils Absolute: 0.1 10*3/uL (ref 0.0–0.5)
Eosinophils Relative: 1 %
HCT: 33 % — ABNORMAL LOW (ref 36.0–46.0)
Hemoglobin: 10.3 g/dL — ABNORMAL LOW (ref 12.0–15.0)
Immature Granulocytes: 0 %
Lymphocytes Relative: 22 %
Lymphs Abs: 1.6 10*3/uL (ref 0.7–4.0)
MCH: 24.1 pg — ABNORMAL LOW (ref 26.0–34.0)
MCHC: 31.2 g/dL (ref 30.0–36.0)
MCV: 77.1 fL — ABNORMAL LOW (ref 80.0–100.0)
Monocytes Absolute: 0.4 10*3/uL (ref 0.1–1.0)
Monocytes Relative: 6 %
Neutro Abs: 4.9 10*3/uL (ref 1.7–7.7)
Neutrophils Relative %: 70 %
Platelet Count: 518 10*3/uL — ABNORMAL HIGH (ref 150–400)
RBC: 4.28 MIL/uL (ref 3.87–5.11)
RDW: 16.5 % — ABNORMAL HIGH (ref 11.5–15.5)
WBC Count: 7.1 10*3/uL (ref 4.0–10.5)
nRBC: 0 % (ref 0.0–0.2)

## 2024-07-06 LAB — GLUCOSE, CAPILLARY: Glucose-Capillary: 99 mg/dL (ref 70–99)

## 2024-07-06 LAB — CMP (CANCER CENTER ONLY)
ALT: 11 U/L (ref 0–44)
AST: 16 U/L (ref 15–41)
Albumin: 4.3 g/dL (ref 3.5–5.0)
Alkaline Phosphatase: 63 U/L (ref 38–126)
Anion gap: 12 (ref 5–15)
BUN: 10 mg/dL (ref 6–20)
CO2: 22 mmol/L (ref 22–32)
Calcium: 9.2 mg/dL (ref 8.9–10.3)
Chloride: 105 mmol/L (ref 98–111)
Creatinine: 0.75 mg/dL (ref 0.44–1.00)
GFR, Estimated: >60 mL/min
Glucose, Bld: 98 mg/dL (ref 70–99)
Potassium: 4.3 mmol/L (ref 3.5–5.1)
Sodium: 139 mmol/L (ref 135–145)
Total Bilirubin: 0.3 mg/dL (ref 0.0–1.2)
Total Protein: 8 g/dL (ref 6.5–8.1)

## 2024-07-06 LAB — T4, FREE: Free T4: 1.36 ng/dL (ref 0.80–2.00)

## 2024-07-06 LAB — TSH: TSH: 1.02 u[IU]/mL (ref 0.350–4.500)

## 2024-07-06 MED ORDER — FLUDEOXYGLUCOSE F - 18 (FDG) INJECTION
13.0000 | Freq: Once | INTRAVENOUS | Status: AC
  Administered 2024-07-06: 16.01 via INTRAVENOUS

## 2024-07-06 MED ORDER — SODIUM CHLORIDE 0.9 % IV SOLN
200.0000 mg | Freq: Once | INTRAVENOUS | Status: AC
  Administered 2024-07-06: 200 mg via INTRAVENOUS
  Filled 2024-07-06: qty 200

## 2024-07-06 MED ORDER — ACETAMINOPHEN-CODEINE 300-30 MG PO TABS: 1.0000 | ORAL_TABLET | Freq: Three times a day (TID) | ORAL | 0 refills | Status: AC | PRN

## 2024-07-06 MED ORDER — PROCHLORPERAZINE MALEATE 10 MG PO TABS: 10.0000 mg | ORAL_TABLET | Freq: Three times a day (TID) | ORAL | 0 refills | Status: AC | PRN

## 2024-07-06 MED ORDER — SODIUM CHLORIDE 0.9 % IV SOLN: Freq: Once | INTRAVENOUS | Status: AC

## 2024-07-06 NOTE — Patient Instructions (Signed)

## 2024-07-11 ENCOUNTER — Encounter: Payer: Self-pay | Admitting: Hematology

## 2024-07-11 LAB — CEA (ACCESS): CEA (CHCC): <1 ng/mL (ref 0.00–5.00)

## 2024-08-29 ENCOUNTER — Encounter: Payer: Self-pay | Admitting: Internal Medicine
